# Patient Record
Sex: Male | Born: 1960 | Race: Black or African American | Hispanic: No | Smoking: Former smoker
Health system: Southern US, Community
[De-identification: ages and names within clinical notes are randomized; demographics above are authoritative.]

## PROBLEM LIST (undated history)

## (undated) DIAGNOSIS — E669 Obesity, unspecified: Secondary | ICD-10-CM

## (undated) DIAGNOSIS — I639 Cerebral infarction, unspecified: Secondary | ICD-10-CM

## (undated) DIAGNOSIS — K219 Gastro-esophageal reflux disease without esophagitis: Secondary | ICD-10-CM

## (undated) DIAGNOSIS — I209 Angina pectoris, unspecified: Secondary | ICD-10-CM

## (undated) DIAGNOSIS — I10 Essential (primary) hypertension: Secondary | ICD-10-CM

## (undated) DIAGNOSIS — R0600 Dyspnea, unspecified: Secondary | ICD-10-CM

## (undated) DIAGNOSIS — I2510 Atherosclerotic heart disease of native coronary artery without angina pectoris: Secondary | ICD-10-CM

## (undated) DIAGNOSIS — Z992 Dependence on renal dialysis: Secondary | ICD-10-CM

## (undated) DIAGNOSIS — N186 End stage renal disease: Secondary | ICD-10-CM

## (undated) DIAGNOSIS — N179 Acute kidney failure, unspecified: Secondary | ICD-10-CM

## (undated) DIAGNOSIS — Z8489 Family history of other specified conditions: Secondary | ICD-10-CM

## (undated) DIAGNOSIS — Z720 Tobacco use: Secondary | ICD-10-CM

## (undated) DIAGNOSIS — E7800 Pure hypercholesterolemia, unspecified: Secondary | ICD-10-CM

## (undated) DIAGNOSIS — E1169 Type 2 diabetes mellitus with other specified complication: Secondary | ICD-10-CM

## (undated) DIAGNOSIS — K5790 Diverticulosis of intestine, part unspecified, without perforation or abscess without bleeding: Secondary | ICD-10-CM

## (undated) HISTORY — PX: TONSILLECTOMY: SUR1361

---
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
## 2012-01-22 ENCOUNTER — Encounter (HOSPITAL_COMMUNITY): Payer: Self-pay | Admitting: Emergency Medicine

## 2012-01-22 ENCOUNTER — Emergency Department (HOSPITAL_COMMUNITY)
Admission: EM | Admit: 2012-01-22 | Discharge: 2012-01-22 | Disposition: A | Discharge status: Home / Self Care | Payer: Self-pay | Attending: Emergency Medicine | Admitting: Emergency Medicine

## 2012-01-22 ENCOUNTER — Emergency Department (HOSPITAL_COMMUNITY): Payer: Self-pay

## 2012-01-22 DIAGNOSIS — R1032 Left lower quadrant pain: Secondary | ICD-10-CM | POA: Insufficient documentation

## 2012-01-22 DIAGNOSIS — K5790 Diverticulosis of intestine, part unspecified, without perforation or abscess without bleeding: Secondary | ICD-10-CM | POA: Insufficient documentation

## 2012-01-22 DIAGNOSIS — Z8719 Personal history of other diseases of the digestive system: Secondary | ICD-10-CM | POA: Insufficient documentation

## 2012-01-22 DIAGNOSIS — I10 Essential (primary) hypertension: Secondary | ICD-10-CM

## 2012-01-22 DIAGNOSIS — R0602 Shortness of breath: Secondary | ICD-10-CM

## 2012-01-22 DIAGNOSIS — R079 Chest pain, unspecified: Secondary | ICD-10-CM

## 2012-01-22 DIAGNOSIS — G8929 Other chronic pain: Secondary | ICD-10-CM | POA: Insufficient documentation

## 2012-01-22 DIAGNOSIS — F17200 Nicotine dependence, unspecified, uncomplicated: Secondary | ICD-10-CM | POA: Insufficient documentation

## 2012-01-22 HISTORY — DX: Essential (primary) hypertension: I10

## 2012-01-22 HISTORY — DX: Diverticulosis of intestine, part unspecified, without perforation or abscess without bleeding: K57.90

## 2012-01-22 LAB — COMPREHENSIVE METABOLIC PANEL
ALT: 14 U/L (ref 0–53)
AST: 16 U/L (ref 0–37)
Albumin: 4.2 g/dL (ref 3.5–5.2)
Alkaline Phosphatase: 132 U/L — ABNORMAL HIGH (ref 39–117)
BUN: 15 mg/dL (ref 6–23)
CO2: 32 mEq/L (ref 19–32)
Calcium: 10.1 mg/dL (ref 8.4–10.5)
Chloride: 100 mEq/L (ref 96–112)
Creatinine, Ser: 1.3 mg/dL (ref 0.50–1.35)
GFR calc Af Amer: 72 mL/min — ABNORMAL LOW (ref >90)
GFR calc non Af Amer: 62 mL/min — ABNORMAL LOW (ref >90)
Glucose, Bld: 134 mg/dL — ABNORMAL HIGH (ref 70–99)
Potassium: 4.4 mEq/L (ref 3.5–5.1)
Sodium: 144 mEq/L (ref 135–145)
Total Bilirubin: 0.3 mg/dL (ref 0.3–1.2)
Total Protein: 8.3 g/dL (ref 6.0–8.3)

## 2012-01-22 LAB — CBC WITH DIFFERENTIAL/PLATELET
Basophils Absolute: 0 10*3/uL (ref 0.0–0.1)
Basophils Relative: 0 % (ref 0–1)
Eosinophils Absolute: 0.2 10*3/uL (ref 0.0–0.7)
Eosinophils Relative: 2 % (ref 0–5)
HCT: 45.2 % (ref 39.0–52.0)
Hemoglobin: 15.1 g/dL (ref 13.0–17.0)
Lymphocytes Relative: 34 % (ref 12–46)
Lymphs Abs: 4.5 10*3/uL — ABNORMAL HIGH (ref 0.7–4.0)
MCH: 28.5 pg (ref 26.0–34.0)
MCHC: 33.4 g/dL (ref 30.0–36.0)
MCV: 85.3 fL (ref 78.0–100.0)
Monocytes Absolute: 1 10*3/uL (ref 0.1–1.0)
Monocytes Relative: 8 % (ref 3–12)
Neutro Abs: 7.5 10*3/uL (ref 1.7–7.7)
Neutrophils Relative %: 57 % (ref 43–77)
Platelets: 259 10*3/uL (ref 150–400)
RBC: 5.3 MIL/uL (ref 4.22–5.81)
RDW: 13.7 % (ref 11.5–15.5)
WBC: 13.3 10*3/uL — ABNORMAL HIGH (ref 4.0–10.5)

## 2012-01-22 LAB — POCT I-STAT TROPONIN I
Troponin i, poc: 0 ng/mL (ref 0.00–0.08)
Troponin i, poc: 0 ng/mL (ref 0.00–0.08)

## 2012-01-22 IMAGING — CR DG CHEST 2V
2 series · 2 of 2 positions shown · non-contrast
Comparison: None.

CLINICAL DATA: Chest pain.  Short of breath.

CHEST - 2 VIEW

[w chest pa]
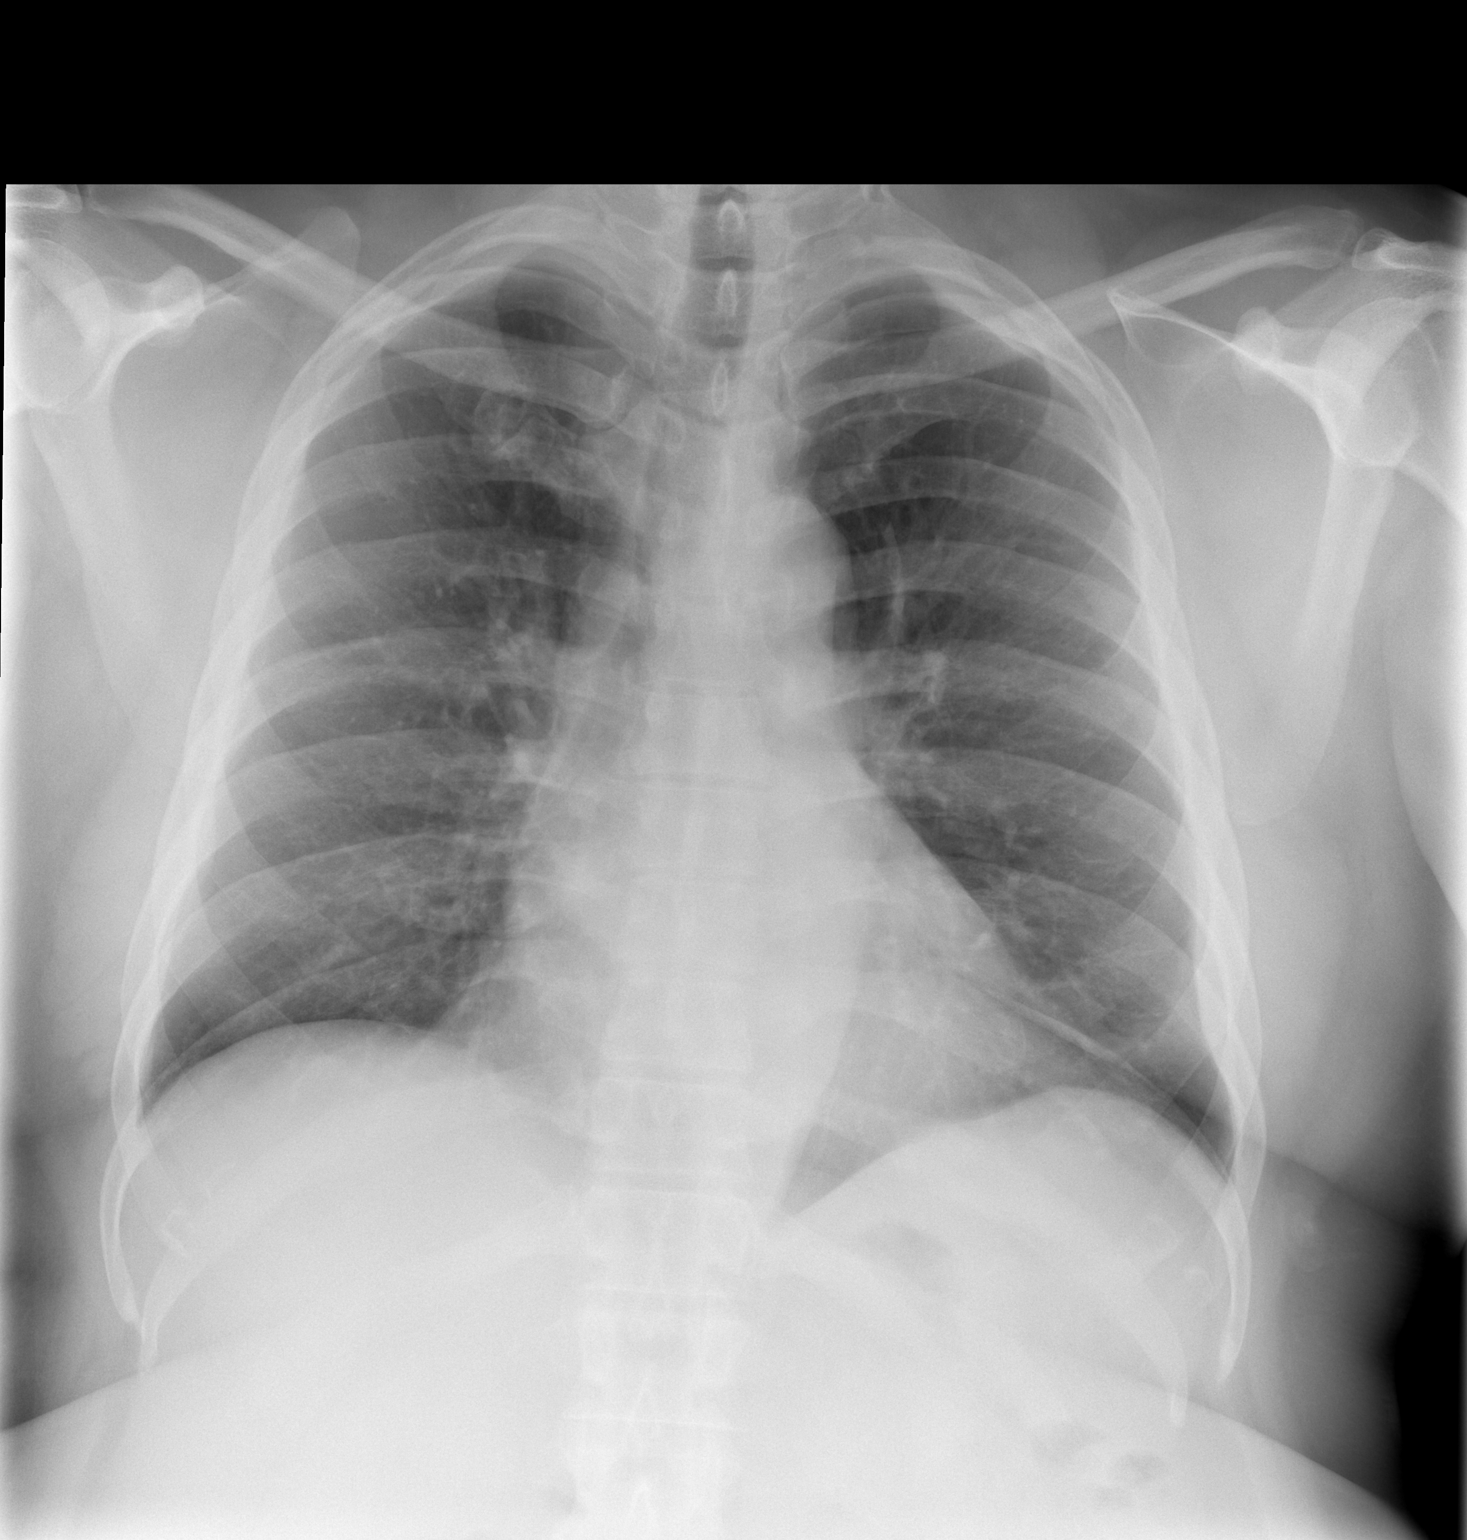

[w chest lat]
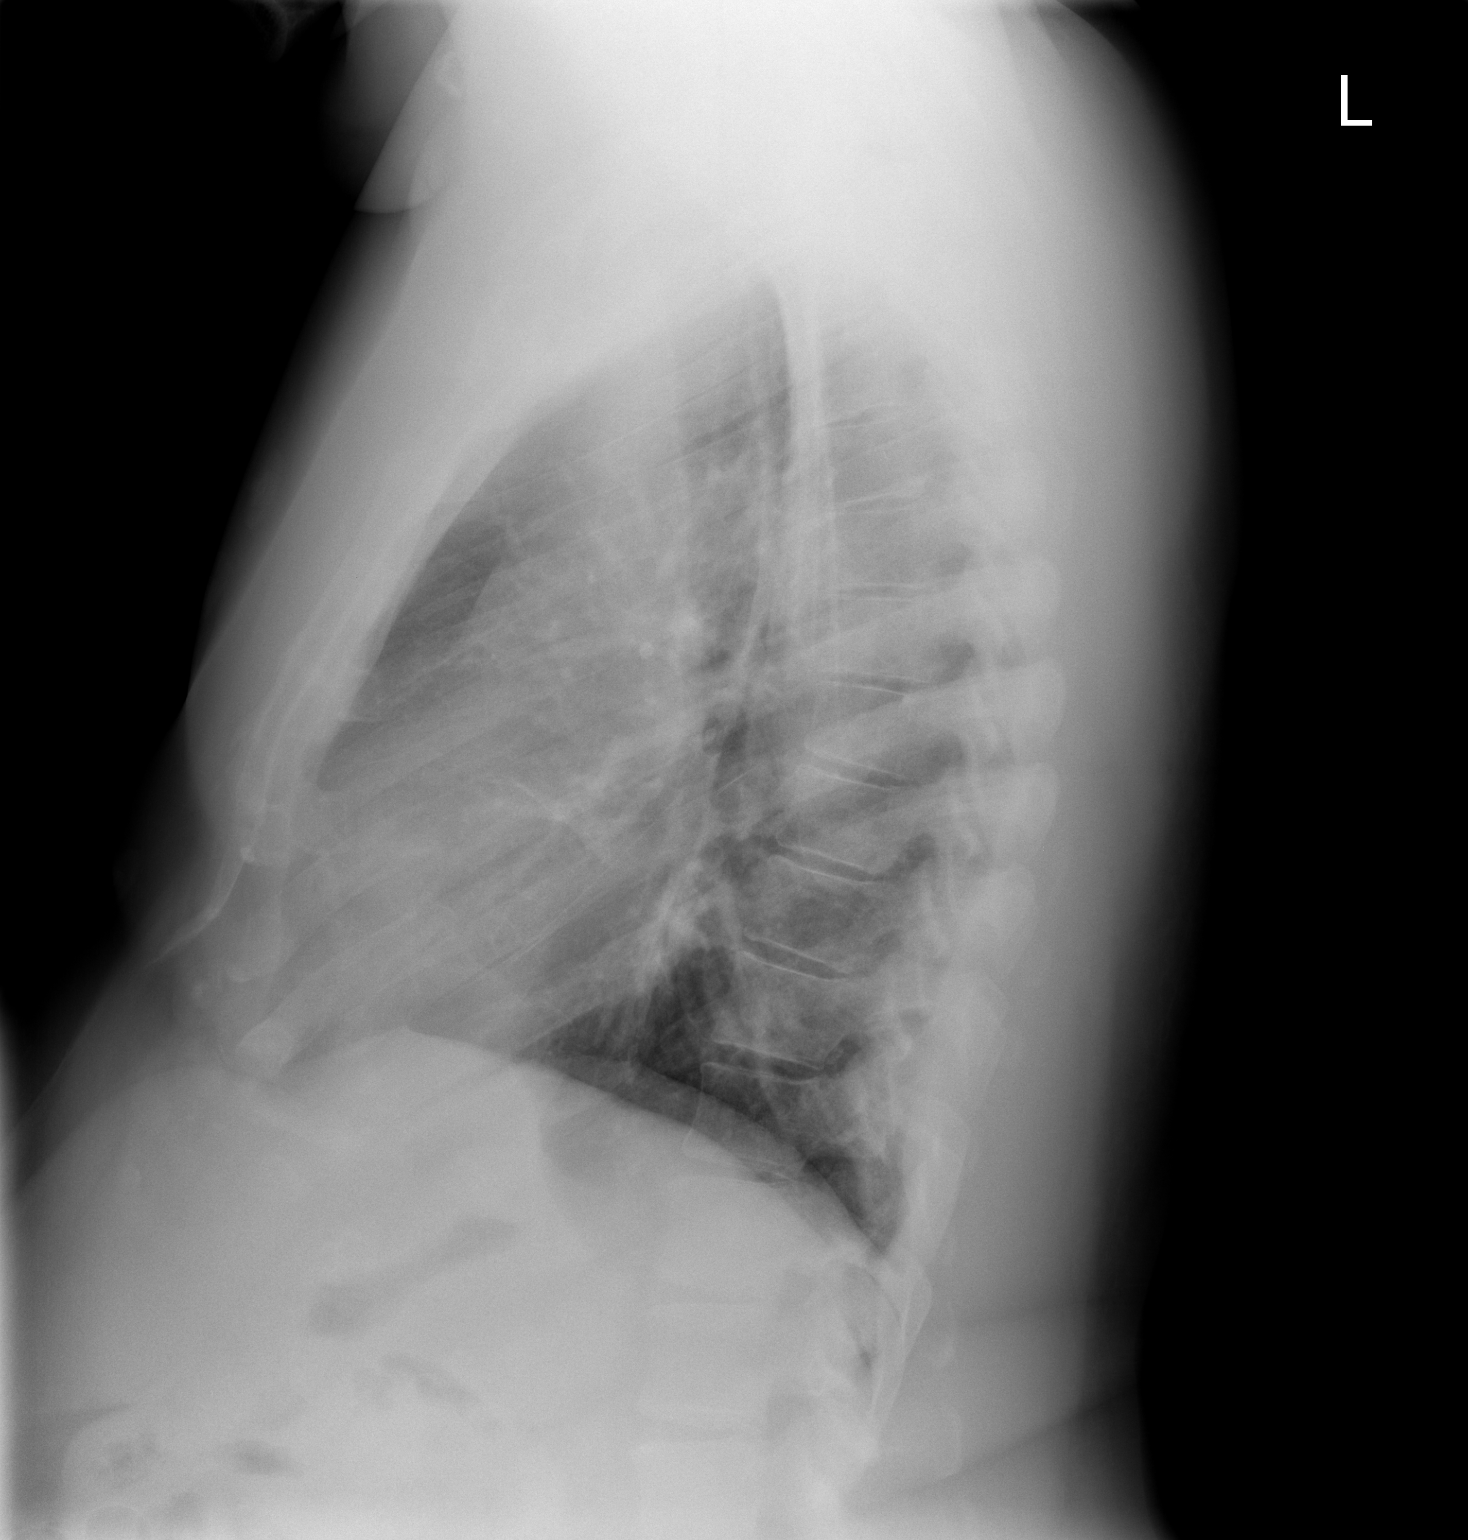

[2 of 2 positions shown; findings below may reference images not displayed]

FINDINGS: Subsegmental atelectasis is present in the lingula.  This
is seen on both frontal and lateral views. No pneumothorax.  No
airspace disease.  No effusion.  Cardiopericardial silhouette and
mediastinal contours are within normal limits.  Trachea midline.
IMPRESSION: No acute cardiopulmonary disease.  Subsegmental atelectasis in the
lingula.

## 2012-01-22 MED ORDER — IRBESARTAN 75 MG PO TABS: 75.0000 mg | ORAL_TABLET | Freq: Every day | ORAL | Status: DC

## 2012-01-22 MED ORDER — AMLODIPINE BESYLATE 5 MG PO TABS
5.0000 mg | ORAL_TABLET | Freq: Once | ORAL | Status: AC
  Administered 2012-01-22: 5 mg via ORAL
  Filled 2012-01-22: qty 1

## 2012-01-22 MED ORDER — AMLODIPINE BESYLATE 5 MG PO TABS: 5.0000 mg | ORAL_TABLET | Freq: Once | ORAL | Status: DC

## 2012-01-22 MED ORDER — IRBESARTAN 75 MG PO TABS
75.0000 mg | ORAL_TABLET | Freq: Every day | ORAL | Status: DC
  Administered 2012-01-22 (×2): 75 mg via ORAL
  Filled 2012-01-22: qty 1

## 2012-01-22 NOTE — ED Notes (Addendum)
Patient complaining of intermittent chest pain and shortness of breath for the past three weeks; was seen by primary care physician today and referred here.  Patient denies chest pain at this time.  Patient reports that pain sometimes begins upon exertion.  Only cardiac history reported is hypertension; blood pressure elevated in triage (99991111 systolic); patient reports that he has not been taking his blood pressure medications for the past three months due to his insurance.

## 2012-01-22 NOTE — ED Provider Notes (Signed)
History     CSN: [id number]  Arrival date & time 01/22/12  1909   First MD Initiated Contact with Patient 01/22/12 1954      Chief Complaint  Patient presents with  . Chest Pain  . Shortness of Breath    (Consider location/radiation/quality/duration/timing/severity/associated sxs/prior treatment) Patient is a 51 y.o. male presenting with shortness of breath. The history is provided by the patient.  Shortness of Breath  The current episode started more than 2 weeks ago. The onset was sudden. The problem occurs occasionally. The problem has been gradually worsening. The problem is mild. The symptoms are relieved by rest. Nothing aggravates the symptoms. Associated symptoms include chest pain (with the shortness of breath) and shortness of breath. Pertinent negatives include no chest pressure, no fever and no cough.    Past Medical History  Diagnosis Date  . Hypertension   . Diverticulosis     History reviewed. No pertinent past surgical history.  History reviewed. No pertinent family history.  History  Substance Use Topics  . Smoking status: Current Every Day Smoker -- 0.5 packs/day  . Smokeless tobacco: Not on file  . Alcohol Use: Yes      Review of Systems  Constitutional: Negative for fever.  Respiratory: Positive for shortness of breath. Negative for cough.   Cardiovascular: Positive for chest pain (with the shortness of breath).  All other systems reviewed and are negative.    Allergies  Aspirin  Home Medications  No current outpatient prescriptions on file.  BP 187/104  Pulse 103  Temp 97.8 F (36.6 C) (Oral)  Resp 20  SpO2 96%  Physical Exam  Nursing note and vitals reviewed. Constitutional: He is oriented to person, place, and time. He appears well-developed and well-nourished. No distress.  HENT:  Head: Normocephalic and atraumatic.  Mouth/Throat: No oropharyngeal exudate.  Eyes: EOM are normal. Pupils are equal, round, and reactive to light.   Neck: Normal range of motion. Neck supple.  Cardiovascular: Normal rate and regular rhythm.  Exam reveals no friction rub.   No murmur heard. Pulmonary/Chest: Effort normal and breath sounds normal. No respiratory distress. He has no wheezes. He has no rales.  Abdominal: He exhibits no distension. There is tenderness (RLQ, chronic). There is no rebound.  Musculoskeletal: Normal range of motion. He exhibits no edema.  Neurological: He is alert and oriented to person, place, and time.  Skin: He is not diaphoretic.    ED Course  Procedures (including critical care time)  Labs Reviewed  CBC WITH DIFFERENTIAL - Abnormal; Notable for the following:    WBC 13.3 (*)     Lymphs Abs 4.5 (*)     All other components within normal limits  COMPREHENSIVE METABOLIC PANEL - Abnormal; Notable for the following:    Glucose, Bld 134 (*)     Alkaline Phosphatase 132 (*)     GFR calc non Af Amer 62 (*)     GFR calc Af Amer 72 (*)     All other components within normal limits  POCT I-STAT TROPONIN I   Dg Chest 2 View  01/22/2012  *RADIOLOGY REPORT*  Clinical Data: Chest pain.  Short of breath.  CHEST - 2 VIEW  Comparison: None.  Findings: Subsegmental atelectasis is present in the lingula.  This is seen on both frontal and lateral views. No pneumothorax.  No airspace disease.  No effusion.  Cardiopericardial silhouette and mediastinal contours are within normal limits.  Trachea midline.  IMPRESSION: No acute cardiopulmonary disease.  Subsegmental atelectasis in the lingula.   Original Report Authenticated By: Dereck Ligas, M.D.      No diagnosis found.   Date: 01/22/2012  Rate: 93  Rhythm: normal sinus rhythm  QRS Axis: normal  Intervals: normal  ST/T Wave abnormalities: Flipped T waves in inferior and lateral leads  Conduction Disutrbances:none  Narrative Interpretation:   Old EKG Reviewed: none available    MDM   The patient is a 51 year old male who presents with episodes of  shortness of breath that has increased in frequency over the past month. He describes these episodes of this brief lasting anywhere from several minutes to an hour at a time. They happen spontaneously also occasionally happen with exertion. He occasionally has chest pain with these episodes. They have become more frequent in the last week. They are not always associated with exertion. Patient has no primary heart history. He has hypertension, and he has not been taking his medications for the past several months. He also smokes half a pack per day and is morbidly obese. Patient reports he went to the doctor today because he wanted to get control with self, not due to any chest pain today, and he was sent here for further evaluation after EKG showed some flipped T waves in inferior and lateral leads. We do not have any prior EKGs. Patient is asymptomatic. He is resting well. He is hypertensive. He used to take a combo of amlodipine and valsartan. I will give him that now and write him a prescription for the same. Patient's exam is benign. Patient's EKG here similar to prior needle with flipped T waves in the inferior and lateral leads, c/w anda strain pattern. I feel pa plastic and tient's chest pain and shortness of breath or due to several factors. I feel like it is due to his deconditioning and obesity plus a smoking history. With being completely asymptomatic here, I felt like patient could pursue an outpatient versus CDU stress. I explained these options to the patient he would prefer outpatient stress I will perform a delta troponin as the first was normal. I will start him on his valsartan and amlodipine to go home with. Patient also states some left lower quadrant pain which is chronic. He states he has been worked up for diverticulitis but was found to not have diverticulitis. He is mildly tender there without guarding or rebound. Do not feel he warrants imaging at this time. I will give him PCP followup  for chest pain and will instructed to follow up with PCP for this abdominal pain as well. Delta troponin negative - stable for discharge. Patient comfortable with this plan.         Evelina Bucy, MD 01/22/12 [phone]

## 2012-01-28 NOTE — ED Provider Notes (Signed)
I saw and evaluated the patient, reviewed the resident's note and I agree with the findings and plan.  51 year old male with exertional dyspnea. Exam patient is in no acute distress. He is obese. Lungs are clear bilaterally with no increased work of breathing. Heart is regular without murmur. Abdomen is benign. Lower extremities symmetric as compared to each other. No calf tenderness. Negative Homan's. No palpable cords. I suspect patient's symptoms are multifactorial. He is obese and has a fairly sedentary lifestyle. Denies suspect that he is deconditioned. Patient also is a smoker and continues to smoke. Consider anginal 1, but doubt. Patient does have some flipped T waves on his EKG, but no prior for comparison. Suspect LVH with strain. Patient is mildly hypertensive. Patient started back on his home medications. Troponin x2 is normal. Offered patient observation to CDU with the plan for stress testing in the morning. Patient would rather followup as an outpatient. I feel is safe for discharge. Return precautions were discussed  Virgel Manifold, MD 01/28/12 [phone]

## 2015-03-15 ENCOUNTER — Emergency Department (HOSPITAL_COMMUNITY): Payer: BLUE CROSS/BLUE SHIELD

## 2015-03-15 ENCOUNTER — Encounter (HOSPITAL_COMMUNITY): Payer: Self-pay | Admitting: *Deleted

## 2015-03-15 ENCOUNTER — Emergency Department (HOSPITAL_COMMUNITY)
Admission: EM | Admit: 2015-03-15 | Discharge: 2015-03-16 | Disposition: A | Discharge status: Home / Self Care | Payer: BLUE CROSS/BLUE SHIELD | Source: Home / Self Care | Attending: Emergency Medicine | Admitting: Emergency Medicine

## 2015-03-15 DIAGNOSIS — F17210 Nicotine dependence, cigarettes, uncomplicated: Secondary | ICD-10-CM

## 2015-03-15 DIAGNOSIS — Z7984 Long term (current) use of oral hypoglycemic drugs: Secondary | ICD-10-CM | POA: Insufficient documentation

## 2015-03-15 DIAGNOSIS — I10 Essential (primary) hypertension: Secondary | ICD-10-CM | POA: Insufficient documentation

## 2015-03-15 DIAGNOSIS — E7800 Pure hypercholesterolemia, unspecified: Secondary | ICD-10-CM

## 2015-03-15 DIAGNOSIS — R079 Chest pain, unspecified: Secondary | ICD-10-CM | POA: Insufficient documentation

## 2015-03-15 DIAGNOSIS — F419 Anxiety disorder, unspecified: Secondary | ICD-10-CM | POA: Insufficient documentation

## 2015-03-15 DIAGNOSIS — I214 Non-ST elevation (NSTEMI) myocardial infarction: Secondary | ICD-10-CM | POA: Diagnosis not present

## 2015-03-15 DIAGNOSIS — Z8719 Personal history of other diseases of the digestive system: Secondary | ICD-10-CM

## 2015-03-15 DIAGNOSIS — E119 Type 2 diabetes mellitus without complications: Secondary | ICD-10-CM

## 2015-03-15 DIAGNOSIS — R0602 Shortness of breath: Secondary | ICD-10-CM | POA: Insufficient documentation

## 2015-03-15 DIAGNOSIS — Z79899 Other long term (current) drug therapy: Secondary | ICD-10-CM | POA: Insufficient documentation

## 2015-03-15 HISTORY — DX: Pure hypercholesterolemia, unspecified: E78.00

## 2015-03-15 LAB — CBC
HEMATOCRIT: 43.7 % (ref 39.0–52.0)
Hemoglobin: 14.4 g/dL (ref 13.0–17.0)
MCH: 28.2 pg (ref 26.0–34.0)
MCHC: 33 g/dL (ref 30.0–36.0)
MCV: 85.5 fL (ref 78.0–100.0)
PLATELETS: 287 10*3/uL (ref 150–400)
RBC: 5.11 MIL/uL (ref 4.22–5.81)
RDW: 13.6 % (ref 11.5–15.5)
WBC: 9.9 10*3/uL (ref 4.0–10.5)

## 2015-03-15 LAB — BASIC METABOLIC PANEL
ANION GAP: 15 (ref 5–15)
BUN: 21 mg/dL — ABNORMAL HIGH (ref 6–20)
CO2: 28 mmol/L (ref 22–32)
Calcium: 10.6 mg/dL — ABNORMAL HIGH (ref 8.9–10.3)
Chloride: 97 mmol/L — ABNORMAL LOW (ref 101–111)
Creatinine, Ser: 1.57 mg/dL — ABNORMAL HIGH (ref 0.61–1.24)
GFR calc Af Amer: 56 mL/min — ABNORMAL LOW (ref >60)
GFR, EST NON AFRICAN AMERICAN: 48 mL/min — AB (ref >60)
GLUCOSE: 358 mg/dL — AB (ref 65–99)
POTASSIUM: 4.8 mmol/L (ref 3.5–5.1)
Sodium: 140 mmol/L (ref 135–145)

## 2015-03-15 LAB — I-STAT TROPONIN, ED
TROPONIN I, POC: 0.04 ng/mL (ref 0.00–0.08)
Troponin i, poc: 0.04 ng/mL (ref 0.00–0.08)

## 2015-03-15 LAB — D-DIMER, QUANTITATIVE (NOT AT ARMC): D DIMER QUANT: 0.58 ug{FEU}/mL — AB (ref 0.00–0.50)

## 2015-03-15 IMAGING — DX DG CHEST 2V
2 series · 2 of 2 positions shown · non-contrast
Comparison: PA and lateral chest [DATE].

CLINICAL DATA: Chest pain and shortness of breath today. Initial
encounter.

EXAM:
CHEST  2 VIEW

[chest pa]
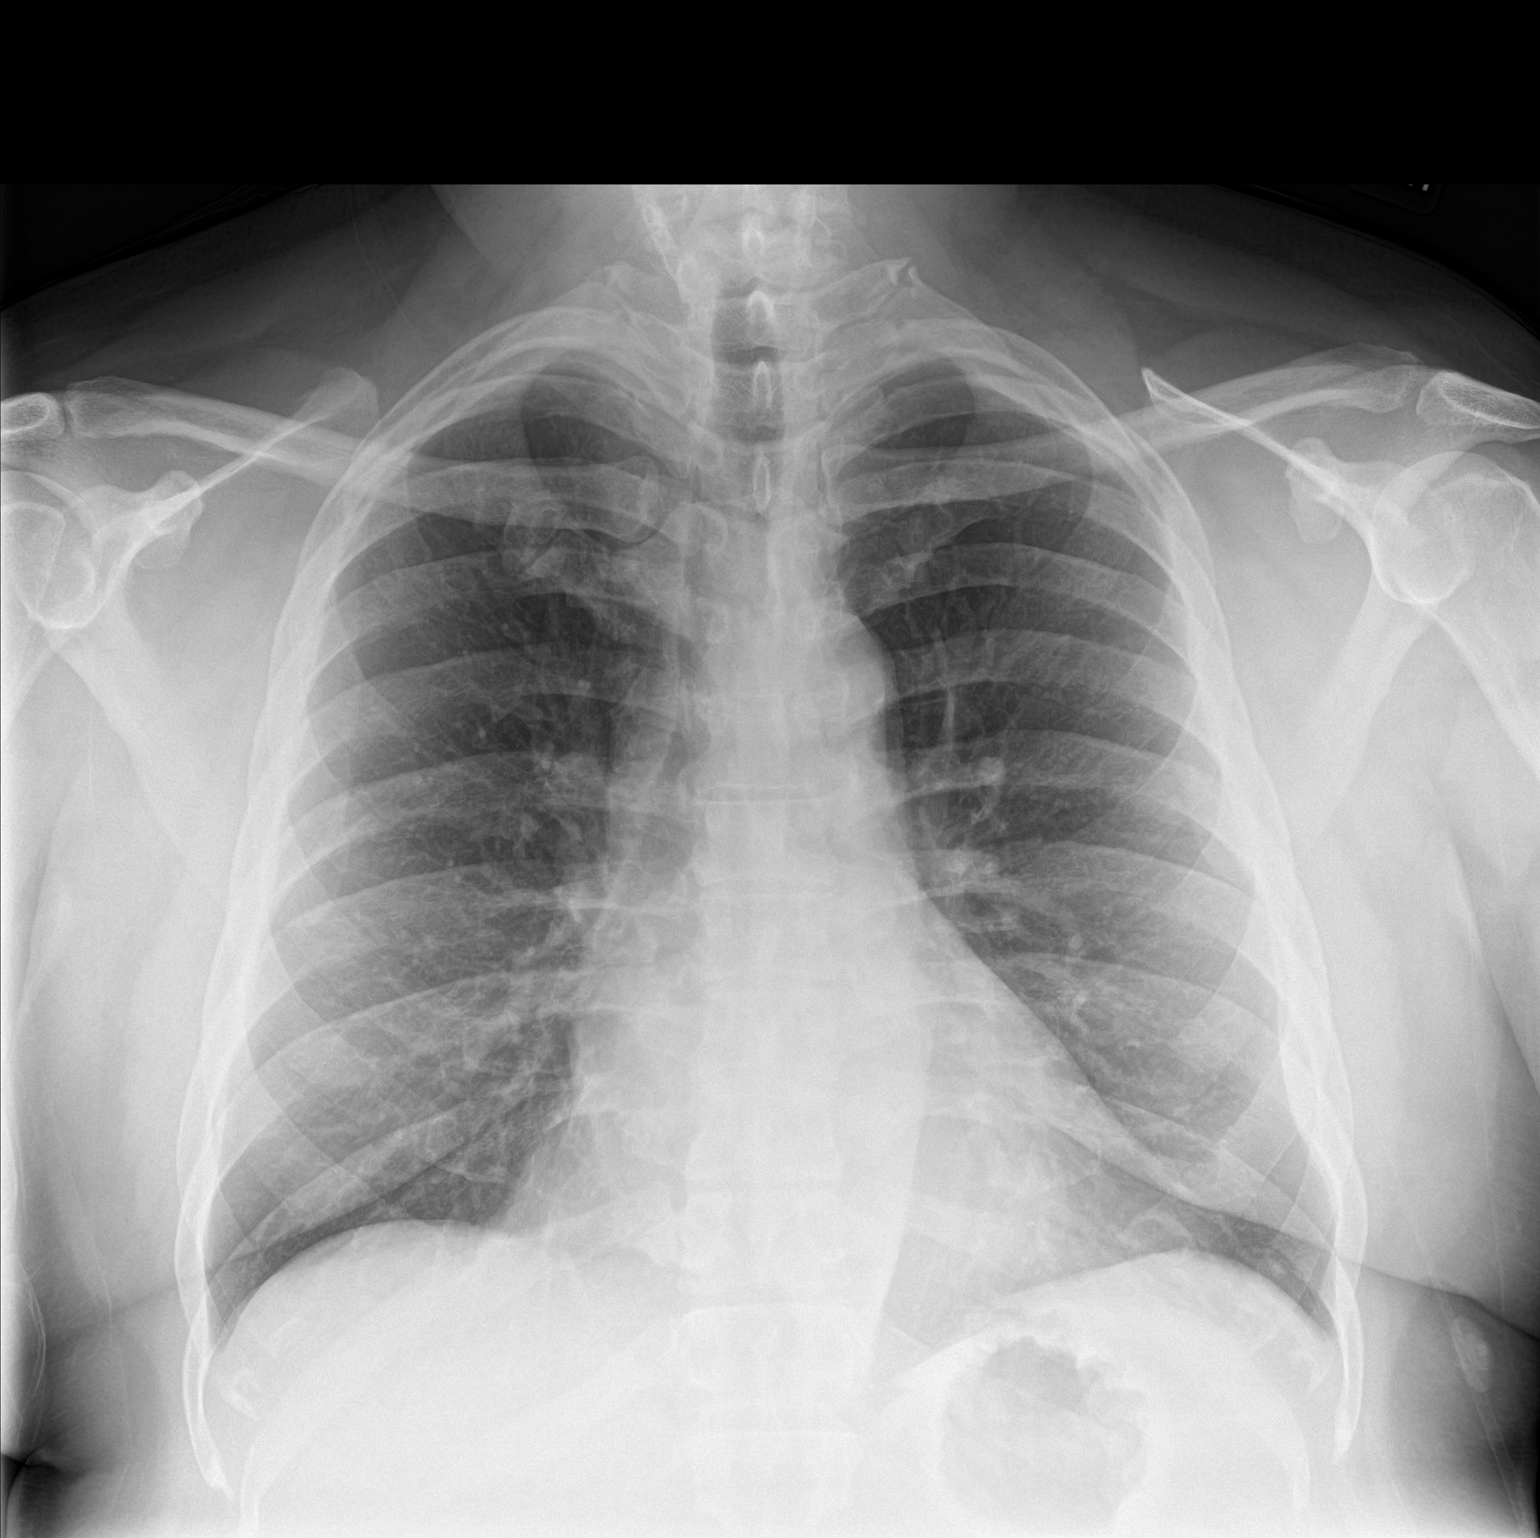

[chest lat]
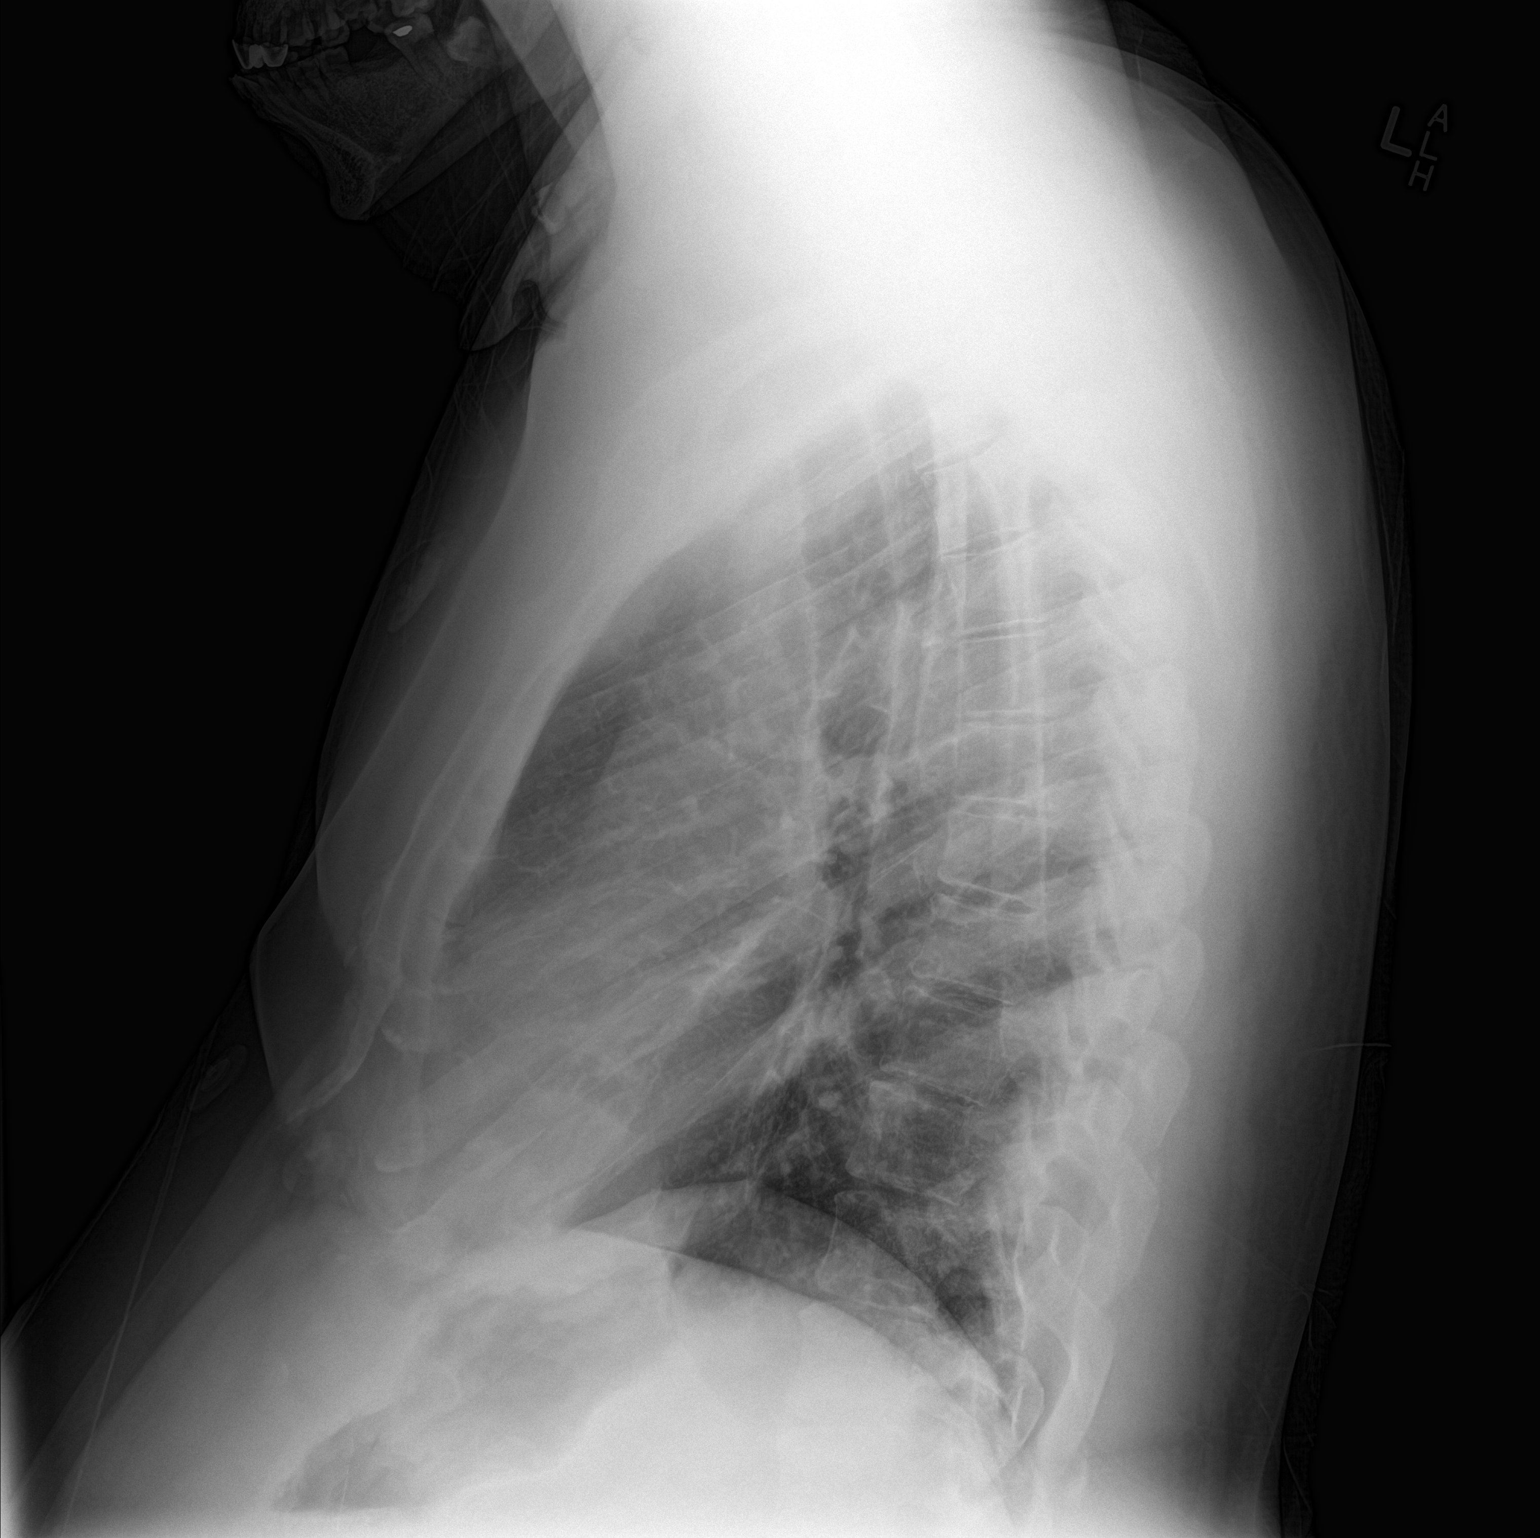

[2 of 2 positions shown; findings below may reference images not displayed]

FINDINGS: The lungs are clear. Heart size is normal. No pneumothorax or
pleural effusion. No focal bony abnormality.
IMPRESSION: Negative chest.

## 2015-03-15 MED ORDER — ASPIRIN 81 MG PO CHEW: 324.0000 mg | CHEWABLE_TABLET | Freq: Once | ORAL | Status: DC

## 2015-03-15 MED ORDER — SODIUM CHLORIDE 0.9 % IV BOLUS (SEPSIS)
1000.0000 mL | Freq: Once | INTRAVENOUS | Status: AC
  Administered 2015-03-15: 1000 mL via INTRAVENOUS

## 2015-03-15 MED ORDER — IOHEXOL 350 MG/ML SOLN
80.0000 mL | Freq: Once | INTRAVENOUS | Status: AC | PRN
Start: 2015-03-15 — End: 2015-03-15
  Administered 2015-03-15: 100 mL via INTRAVENOUS

## 2015-03-15 NOTE — ED Notes (Signed)
Attempted to call CT, line busy, in regards to ETA.

## 2015-03-15 NOTE — ED Notes (Signed)
Called CT for ETA, 2 people currently in front of patient.

## 2015-03-15 NOTE — ED Notes (Signed)
Explained plan of care to patient, preparing for ct.

## 2015-03-15 NOTE — ED Notes (Signed)
Patient is currently watching TV comfortably while waiting for CT.

## 2015-03-15 NOTE — ED Notes (Signed)
Pt c/o CP and shortness of breath x 10 days. States it may be related to his medicine.

## 2015-03-15 NOTE — ED Provider Notes (Signed)
CSN: [id number]     Arrival date & time 03/15/15  1825 History   First MD Initiated Contact with Patient 03/15/15 2022     Chief Complaint  Patient presents with  . Chest Pain     (Consider location/radiation/quality/duration/timing/severity/associated sxs/prior Treatment) Patient is a 55 y.o. male presenting with chest pain. The history is provided by the patient.  Chest Pain Pain location:  Substernal area Pain quality: pressure   Pain radiates to:  Does not radiate Pain radiates to the back: no   Pain severity:  Mild Onset quality:  Gradual Duration: going on 2 years but worse over past 10 days. Timing:  Intermittent Progression:  Waxing and waning Chronicity:  Chronic Context: not at rest   Relieved by:  Nothing Worsened by:  Nothing tried Ineffective treatments:  None tried Associated symptoms: anxiety and shortness of breath   Associated symptoms: no abdominal pain, no altered mental status, no cough, no fever, no nausea and not vomiting     Past Medical History  Diagnosis Date  . Hypertension   . Diverticulosis   . Hypercholesteremia   . Diabetes mellitus without complication ([place])    History reviewed. No pertinent past surgical history. No family history on file. Social History  Substance Use Topics  . Smoking status: Current Every Day Smoker -- 0.50 packs/day    Types: Cigarettes  . Smokeless tobacco: None  . Alcohol Use: Yes     Comment: weekend    Review of Systems  Constitutional: Negative for fever.  HENT: Negative.   Eyes: Negative for visual disturbance.  Respiratory: Positive for shortness of breath. Negative for cough.   Cardiovascular: Positive for chest pain.  Gastrointestinal: Negative for nausea, vomiting, abdominal pain and diarrhea.  Genitourinary: Negative.   Skin: Negative.   Neurological: Negative.       Allergies  Aspirin  Home Medications   Prior to Admission medications   Medication Sig Start Date End Date Taking?  Authorizing Provider  Alogliptin-Pioglitazone (OSENI) 25-30 MG TABS Take 1 tablet by mouth daily.   Yes Historical Provider, MD  amLODipine (NORVASC) 10 MG tablet Take 10 mg by mouth daily. 12/20/14  Yes Historical Provider, MD  lisinopril (PRINIVIL,ZESTRIL) 10 MG tablet Take 10 mg by mouth daily.   Yes Historical Provider, MD  pravastatin (PRAVACHOL) 10 MG tablet Take 10 mg by mouth daily.   Yes Historical Provider, MD   BP 169/95 mmHg  Pulse 74  Temp(Src) 98.1 F (36.7 C)  Resp 26  Ht 5\' 7"  (1.702 m)  Wt 101.152 kg  BMI 34.92 kg/m2  SpO2 97% Physical Exam  Constitutional: He is oriented to person, place, and time. He appears well-developed and well-nourished. No distress.  HENT:  Head: Normocephalic and atraumatic.  Mouth/Throat: No oropharyngeal exudate.  Eyes: Pupils are equal, round, and reactive to light. No scleral icterus.  Neck: Normal range of motion. Neck supple.  Cardiovascular: Normal rate, regular rhythm, normal heart sounds and intact distal pulses.   Pulmonary/Chest: Effort normal and breath sounds normal. No respiratory distress. He has no wheezes. He has no rales. He exhibits no tenderness.  Abdominal: Soft. Bowel sounds are normal. He exhibits no distension. There is no tenderness. There is no rebound and no guarding.  Musculoskeletal: Normal range of motion. He exhibits no edema or tenderness.  Neurological: He is alert and oriented to person, place, and time. No cranial nerve deficit. He exhibits normal muscle tone. Coordination normal.  Skin: Skin is warm and dry. No rash  noted. He is not diaphoretic. No erythema. No pallor.  Psychiatric: He has a normal mood and affect.  Nursing note and vitals reviewed.   ED Course  Procedures (including critical care time) Labs Review Labs Reviewed  BASIC METABOLIC PANEL - Abnormal; Notable for the following:    Chloride 97 (*)    Glucose, Bld 358 (*)    BUN 21 (*)    Creatinine, Ser 1.57 (*)    Calcium 10.6 (*)     GFR calc non Af Amer 48 (*)    GFR calc Af Amer 56 (*)    All other components within normal limits  D-DIMER, QUANTITATIVE (NOT AT [facility]) - Abnormal; Notable for the following:    D-Dimer, Quant 0.58 (*)    All other components within normal limits  CBC  I-STAT TROPOININ, ED  Randolm Idol, ED    Imaging Review Dg Chest 2 View  03/15/2015  CLINICAL DATA:  Chest pain and shortness of breath today. Initial encounter. EXAM: CHEST  2 VIEW COMPARISON:  PA and lateral chest 01/22/2012. FINDINGS: The lungs are clear. Heart size is normal. No pneumothorax or pleural effusion. No focal bony abnormality. IMPRESSION: Negative chest. Electronically Signed   By: Inge Rise M.D.   On: 03/15/2015 19:19   Ct Angio Chest Pe W/cm &/or Wo Cm  03/16/2015  CLINICAL DATA:  Chest pain and shortness of breath for 10 days. EXAM: CT ANGIOGRAPHY CHEST WITH CONTRAST TECHNIQUE: Multidetector CT imaging of the chest was performed using the standard protocol during bolus administration of intravenous contrast. Multiplanar CT image reconstructions and MIPs were obtained to evaluate the vascular anatomy. CONTRAST:  146mL OMNIPAQUE IOHEXOL 350 MG/ML SOLN COMPARISON:  Radiographs 5 hours prior. FINDINGS: There are no filling defects within the pulmonary arteries to suggest pulmonary embolus. Thoracic aorta normal in caliber with mild atherosclerosis. Scattered coronary artery calcifications. Heart upper limits of normal in size. No mediastinal or hilar adenopathy. No pleural or pericardial effusion. Heterogeneous diffuse enlargement of the right lobe of the thyroid gland without CT defined nodule. Scattered atelectasis and hypoventilatory change. There is subpleural fat in the left lung base. Evaluation of the upper abdomen demonstrates no acute abnormality. Liver appears enlarged with steatosis. There are no acute or suspicious osseous abnormalities. Review of the MIP images confirms the above findings. IMPRESSION: 1. No  pulmonary embolus or acute intrathoracic process. 2. Coronary artery calcifications. 3. Incidental findings of heterogeneous enlargement of the right thyroid gland, no CT defined nodule, suspect goiter. 4. Hepatic steatosis. Electronically Signed   By: Jeb Levering M.D.   On: 03/16/2015 00:35   I have personally reviewed and evaluated these images and lab results as part of my medical decision-making.   EKG Interpretation   Date/Time:  Thursday March 15 2015 18:35:34 EST Ventricular Rate:  91 PR Interval:  142 QRS Duration: 88 QT Interval:  358 QTC Calculation: 440 R Axis:   61 Text Interpretation:  Normal sinus rhythm Biatrial enlargement T wave  abnormality, consider inferior ischemia Abnormal ECG No significant change  since last tracing Confirmed by BEATON  MD, ROBERT ([id number]) on 03/15/2015  6:35:26 PM      MDM   Final diagnoses:  SOB (shortness of breath)  Chest pain, unspecified chest pain type    Patient is a 55 year old male with a history of diabetes and hypertension who presents with shortness of breath and chest pain that has been intermittent in nature for about 2 years but over the past 10 days  has been worse and persistent. He describes it as a sensation of not being able to catch his breath as well as a midline dull pressure that does not radiate. Further history and exam as above notable for reassuring vital signs and physical exam. Patient has a negative delta troponin. BMP and CBC unremarkable. EKG without acute ischemia or arrhythmia. D-dimer obtained which was positive. CT PE study without PE. Patient is a low heart score.  I have reviewed all imaging and labs. Patient stable for discharge home.  I have reviewed all results with the patient. Advised to f/u with cardiology within 7 days for further evaluation. Patient agrees to stated plan. All questions answered. Advised to call or return to have any questions, new symptoms, change in symptoms, or symptoms that  they do not understand.    Heriberto Antigua, MD [id number]  Delora Fuel, MD [id number]

## 2015-03-15 NOTE — ED Notes (Signed)
Verified that patient already took aspirin today at doctor's office prior to arrival. Explained plan of care, food provided, next blood draw scheduled.

## 2015-03-15 NOTE — ED Notes (Signed)
Dr. Lanetta Inch at the bedside.

## 2015-03-15 NOTE — ED Notes (Signed)
Meal provided 

## 2015-03-15 NOTE — ED Notes (Signed)
Called CT, spoke to [facility], patient to be picked up by transport next for CTA.

## 2015-03-15 NOTE — ED Notes (Signed)
Called main lab, spoke to Keyser.  D-dimer added on.

## 2015-03-16 ENCOUNTER — Encounter: Payer: Self-pay | Admitting: Cardiovascular Disease

## 2015-03-16 ENCOUNTER — Ambulatory Visit (INDEPENDENT_AMBULATORY_CARE_PROVIDER_SITE_OTHER): Payer: BLUE CROSS/BLUE SHIELD | Admitting: Cardiovascular Disease

## 2015-03-16 ENCOUNTER — Telehealth: Payer: Self-pay

## 2015-03-16 VITALS — BP 122/80 | HR 100 | Ht 67.0 in | Wt 230.0 lb

## 2015-03-16 DIAGNOSIS — I152 Hypertension secondary to endocrine disorders: Secondary | ICD-10-CM | POA: Insufficient documentation

## 2015-03-16 DIAGNOSIS — R0602 Shortness of breath: Secondary | ICD-10-CM | POA: Diagnosis not present

## 2015-03-16 DIAGNOSIS — E1169 Type 2 diabetes mellitus with other specified complication: Secondary | ICD-10-CM | POA: Insufficient documentation

## 2015-03-16 DIAGNOSIS — E785 Hyperlipidemia, unspecified: Secondary | ICD-10-CM | POA: Diagnosis not present

## 2015-03-16 DIAGNOSIS — I10 Essential (primary) hypertension: Secondary | ICD-10-CM | POA: Diagnosis not present

## 2015-03-16 DIAGNOSIS — E1159 Type 2 diabetes mellitus with other circulatory complications: Secondary | ICD-10-CM | POA: Insufficient documentation

## 2015-03-16 DIAGNOSIS — R079 Chest pain, unspecified: Secondary | ICD-10-CM

## 2015-03-16 DIAGNOSIS — R0600 Dyspnea, unspecified: Secondary | ICD-10-CM | POA: Insufficient documentation

## 2015-03-16 DIAGNOSIS — R0609 Other forms of dyspnea: Secondary | ICD-10-CM

## 2015-03-16 HISTORY — DX: Hypertension secondary to endocrine disorders: I15.2

## 2015-03-16 HISTORY — DX: Type 2 diabetes mellitus with other circulatory complications: E11.59

## 2015-03-16 HISTORY — DX: Other forms of dyspnea: R06.09

## 2015-03-16 IMAGING — CT CT ANGIO CHEST
1 of 9 series · 14 of 36 positions shown · IV contrast (Iohexol (Omnipaque 350))
Comparison: Radiographs 5 hours prior.

CLINICAL DATA: Chest pain and shortness of breath for 10 days.

EXAM:
CT ANGIOGRAPHY CHEST WITH CONTRAST
TECHNIQUE: Multidetector CT imaging of the chest was performed using the
standard protocol during bolus administration of intravenous
contrast. Multiplanar CT image reconstructions and MIPs were
obtained to evaluate the vascular anatomy.
CONTRAST:  100mL OMNIPAQUE IOHEXOL 350 MG/ML SOLN

[Series 406: thins pacs · axial · 0.66mm/px · z∈[-533,-283]mm · 14 of 290 slices shown]
[im 20/290  lung]
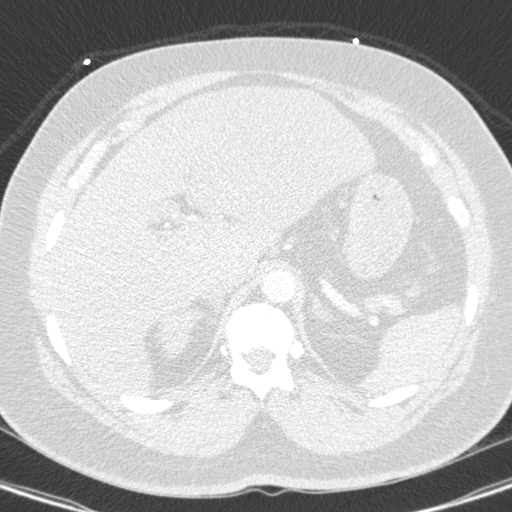
[im 39/290  mediastinal]
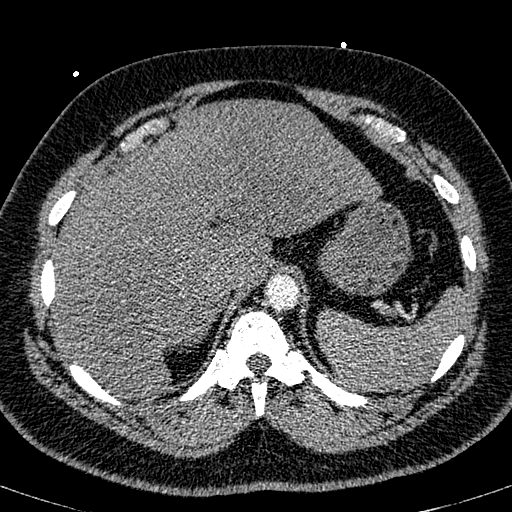
[im 58/290  lung]
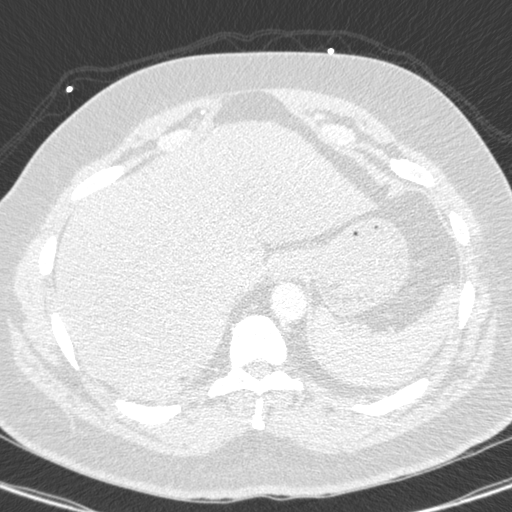
[im 78/290  mediastinal]
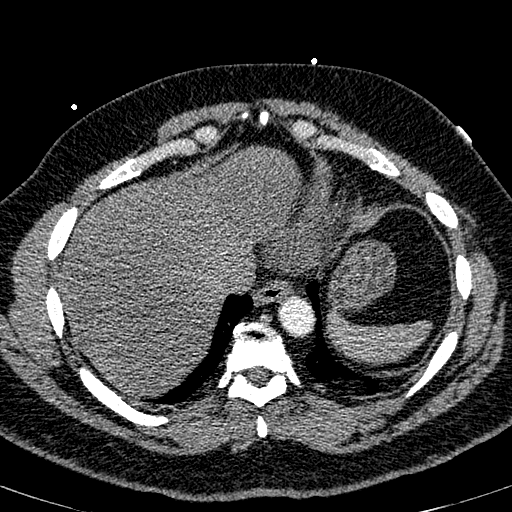
[im 97/290  lung]
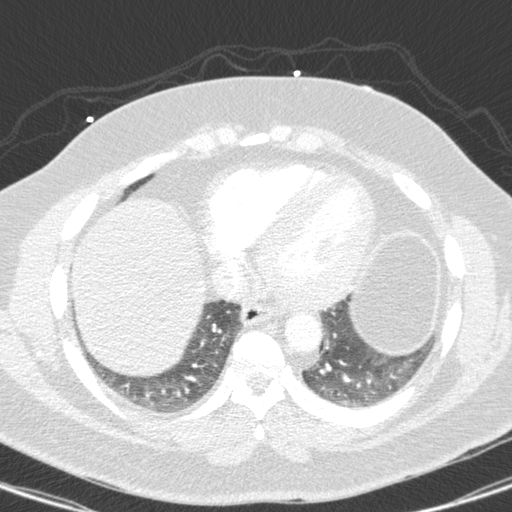
[im 116/290  mediastinal]
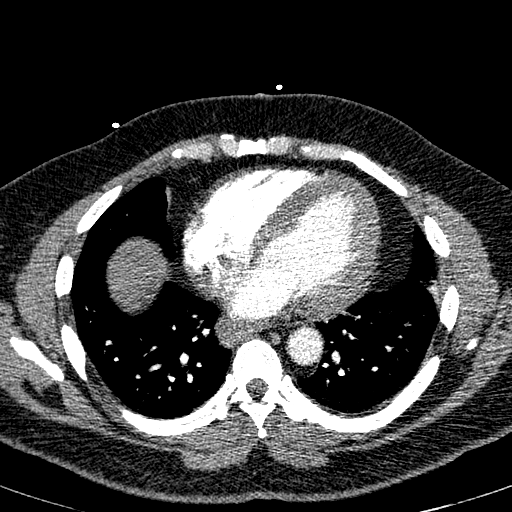
[im 135/290  lung]
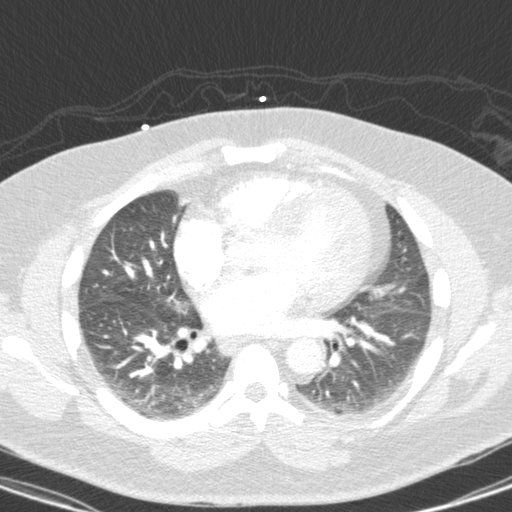
[im 155/290  mediastinal]
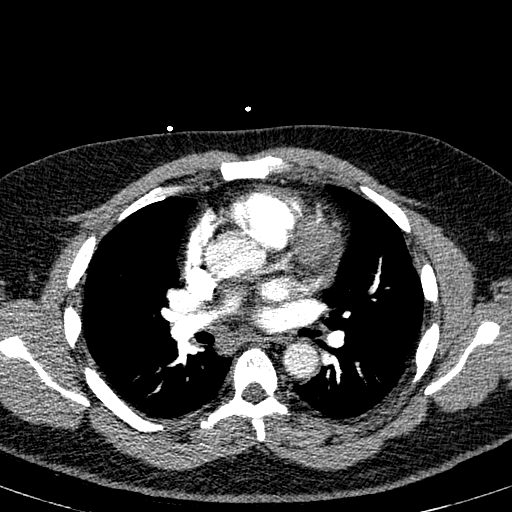
[im 174/290  lung]
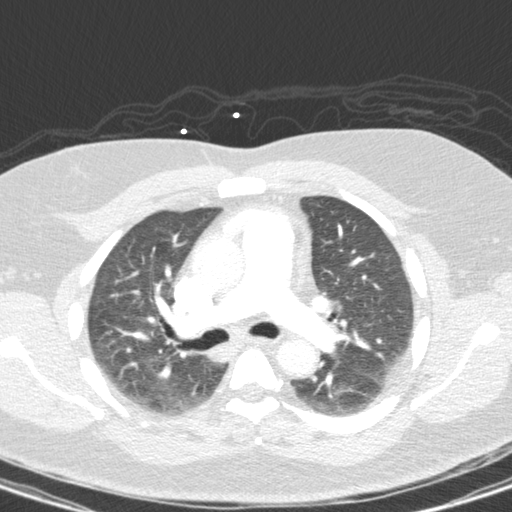
[im 193/290  mediastinal]
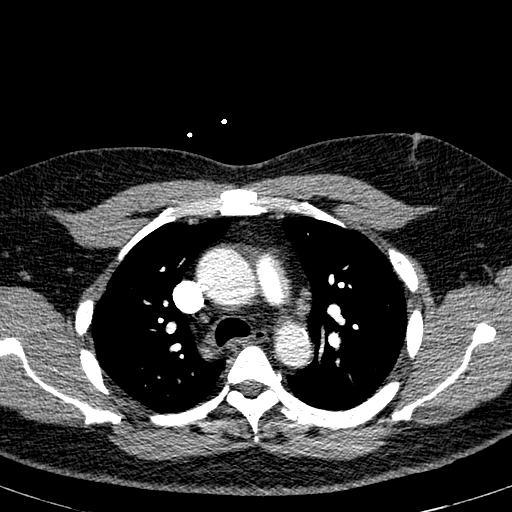
[im 212/290  lung]
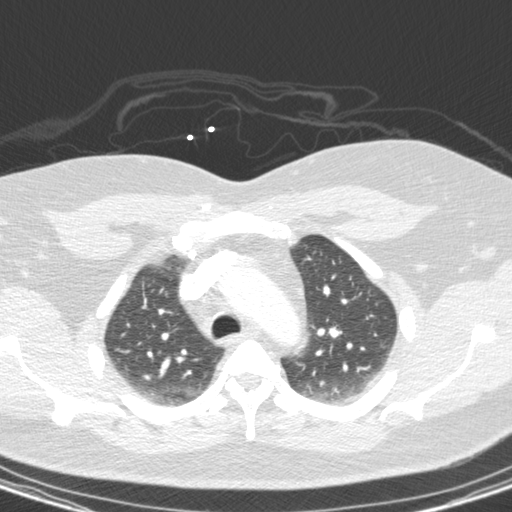
[im 232/290  mediastinal]
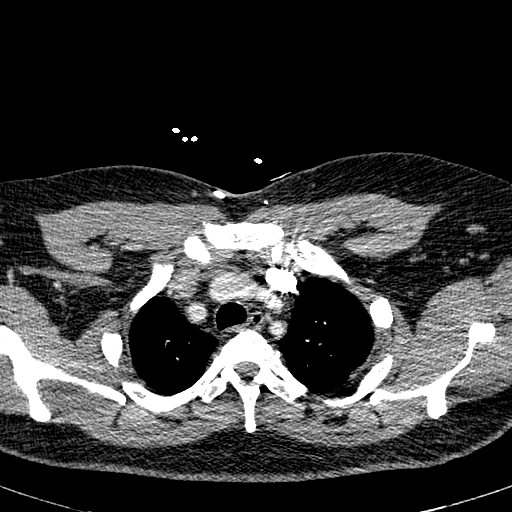
[im 251/290  lung]
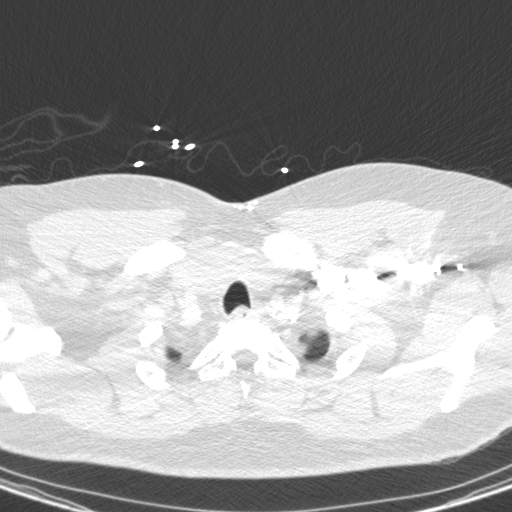
[im 270/290  mediastinal]
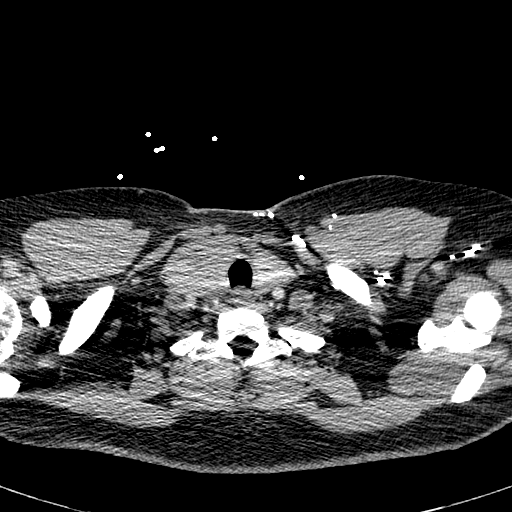

[14 of 36 positions shown; findings below may reference images not displayed]

FINDINGS: There are no filling defects within the pulmonary arteries to
suggest pulmonary embolus.

Thoracic aorta normal in caliber with mild atherosclerosis.
Scattered coronary artery calcifications. Heart upper limits of
normal in size. No mediastinal or hilar adenopathy. No pleural or
pericardial effusion.

Heterogeneous diffuse enlargement of the right lobe of the thyroid
gland without CT defined nodule.

Scattered atelectasis and hypoventilatory change. There is
subpleural fat in the left lung base.

Evaluation of the upper abdomen demonstrates no acute abnormality.
Liver appears enlarged with steatosis.

There are no acute or suspicious osseous abnormalities.

Review of the MIP images confirms the above findings.
IMPRESSION: 1. No pulmonary embolus or acute intrathoracic process.
2. Coronary artery calcifications.
3. Incidental findings of heterogeneous enlargement of the right
thyroid gland, no CT defined nodule, suspect goiter.
4. Hepatic steatosis.

## 2015-03-16 NOTE — Patient Instructions (Addendum)
Medication Instructions:  Your physician recommends that you continue on your current medications as directed. Please refer to the Current Medication list given to you today.   Labwork: none  Testing/Procedures: Your physician has requested that you have an echocardiogram. Echocardiography is a painless test that uses sound waves to create images of your heart. It provides your doctor with information about the size and shape of your heart and how well your heart's chambers and valves are working. This procedure takes approximately one hour. There are no restrictions for this procedure. SCHEDULED NEXT WEEK  Your physician has requested that you have en exercise stress myoview. For further information please visit [email or web address]. Please follow instruction sheet, as given. SCHEDULE NEXT WEEK   Follow-Up: Your physician recommends that you schedule a follow-up appointment in: [place]   Any Other Special Instructions Will Be Listed Below (If Applicable).     If you need a refill on your cardiac medications before your next appointment, please call your pharmacy.

## 2015-03-16 NOTE — Discharge Instructions (Signed)

## 2015-03-16 NOTE — Assessment & Plan Note (Signed)
History of hyperlipidemia on pravastatin followed by his PCP 

## 2015-03-16 NOTE — Progress Notes (Signed)
03/16/2015 [name]   [birth year]  [id number]  Primary Physician No primary care provider on file. Primary Cardiologist: Lorretta Harp MD Renae Gloss   HPI:  Mr. 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 He was referred by [facility] emergency room for cardiovascular evaluation because of chest pressure and dyspnea. She has a history of tobacco abuse smoking one half pack per day for 15 years. History of hypertension, hypokalemia and diabetes. There is no family history. He is complaining of chest pressure and dyspnea over the years worse over the last 10 days. He was seen in the emergency room last night when he was evaluated and ruled out. A CT scan was negative for pulmonary embolism but did show coronary calcification. Cardiac biomarkers  were negative and EKG showed no acute changes.   Current Outpatient Prescriptions  Medication Sig Dispense Refill  . Alogliptin-Pioglitazone (OSENI) 25-30 MG TABS Take 1 tablet by mouth daily.    Marland Kitchen amLODipine (NORVASC) 10 MG tablet Take 10 mg by mouth daily.  3  . lisinopril (PRINIVIL,ZESTRIL) 10 MG tablet Take 10 mg by mouth daily.    . pravastatin (PRAVACHOL) 10 MG tablet Take 10 mg by mouth daily.     No current facility-administered medications for this visit.    Allergies  Allergen Reactions  . Aspirin Other (See Comments)    Upset stomach      Social History   Social History  . Marital Status: Married    Spouse Name: N/A  . Number of Children: N/A  . Years of Education: N/A   Occupational History  . Not on file.   Social History Main Topics  . Smoking status: Current Every Day Smoker -- 0.50 packs/day    Types: Cigarettes  . Smokeless tobacco: Not on file  . Alcohol Use: Yes     Comment: weekend  . Drug Use: Yes    Special: Marijuana  . Sexual Activity: Not on file   Other Topics Concern  . Not on file    Social History Narrative     Review of Systems: General: negative for chills, fever, night sweats or weight changes.  Cardiovascular: negative for chest pain, dyspnea on exertion, edema, orthopnea, palpitations, paroxysmal nocturnal dyspnea or shortness of breath Dermatological: negative for rash Respiratory: negative for cough or wheezing Urologic: negative for hematuria Abdominal: negative for nausea, vomiting, diarrhea, bright red blood per rectum, melena, or hematemesis Neurologic: negative for visual changes, syncope, or dizziness All other systems reviewed and are otherwise negative except as noted above.    Blood pressure 122/80, pulse 100, height 5\' 7"  (1.702 m), weight 230 lb (104.327 kg).  General appearance: alert and no distress Neck: no adenopathy, no carotid bruit, no JVD, supple, symmetrical, trachea midline and thyroid not enlarged, symmetric, no tenderness/mass/nodules Lungs: clear to auscultation bilaterally Heart: regular rate and rhythm, S1, S2 normal, no murmur, click, rub or gallop Extremities: extremities normal, atraumatic, no cyanosis or edema  EKG not performed today  ASSESSMENT AND PLAN:   Essential hypertension History of hypertension blood pressure measurements at 122/80. He is on lisinopril and amlodipine. Continue current meds at current dosing  Hyperlipidemia History of hyperlipidemia on pravastatin followed by his PCP  Dyspnea on exertion Patient was seen in the emergency room last night for dyspnea and chest pressure. Workup was negative including a CT and REM that ruled out pulmonary was in,  EKG and cardiac biomarkers. He does have a history of treated hypertension, diabetes and hyperlipidemia. He smokes one half pack per day. He's had dyspnea and chest pressure for years which have gotten worse over the last 10 days. He was found to have coronary calcification on his chest CT. Based on this, I recommended that we proceed with 2-D echocardiogram  and exercise Myoview stress test to rule out an ischemic etiology.      Lorretta Harp MD FACP,FACC,FAHA, [facility] 03/16/2015 4:02 PM

## 2015-03-16 NOTE — Telephone Encounter (Signed)
Walked into office.  Was told to follow up with cardiology and had to come today because he has been working a new job and has missed a lot and today is the only day that he can come.  Patient saw his own doctor and then sent to the ED and was evaluated discharged at 0100 today.  Told patient we did not have an opening for today but would work with him to get him in to be seen. Patient did not want to do this and walked out.  After his exit registration showed me an email showing openings at [facility] and [facility] to fill all holes in the schedule. Scheduled an appointment. Called patient's phone number on his demographics and left a message on his voice mail telling him to call our office [phone]) and ask for triage. Left message on voice mail that we have an appointment for him at [facility] at 330pm today with Dr. Gwenlyn Found  Will continue to attempt to get a hold of the patient through the day. If  I can not reach will select an appointment for Tuesday on an available hole giving [place] time to reach patient

## 2015-03-16 NOTE — Assessment & Plan Note (Signed)
History of hypertension blood pressure measurements at 122/80. He is on lisinopril and amlodipine. Continue current meds at current dosing

## 2015-03-16 NOTE — Telephone Encounter (Signed)
Patient called back. He is agreeable to see Dr. Gwenlyn Found today at [facility] office at 330pm He is appreciative and thankful

## 2015-03-16 NOTE — Assessment & Plan Note (Signed)
Patient was seen in the emergency room last night for dyspnea and chest pressure. Workup was negative including a CT and REM that ruled out pulmonary was in, EKG and cardiac biomarkers. He does have a history of treated hypertension, diabetes and hyperlipidemia. He smokes one half pack per day. He's had dyspnea and chest pressure for years which have gotten worse over the last 10 days. He was found to have coronary calcification on his chest CT. Based on this, I recommended that we proceed with 2-D echocardiogram and exercise Myoview stress test to rule out an ischemic etiology.

## 2015-03-17 ENCOUNTER — Encounter (HOSPITAL_COMMUNITY): Payer: Self-pay

## 2015-03-17 ENCOUNTER — Inpatient Hospital Stay (HOSPITAL_COMMUNITY)
Admission: EM | Admit: 2015-03-17 | Discharge: 2015-03-23 | DRG: 246 | Disposition: A | Discharge status: Home / Self Care | Payer: BLUE CROSS/BLUE SHIELD | Attending: Cardiovascular Disease | Admitting: Cardiovascular Disease

## 2015-03-17 DIAGNOSIS — L7632 Postprocedural hematoma of skin and subcutaneous tissue following other procedure: Secondary | ICD-10-CM | POA: Diagnosis not present

## 2015-03-17 DIAGNOSIS — K760 Fatty (change of) liver, not elsewhere classified: Secondary | ICD-10-CM | POA: Diagnosis present

## 2015-03-17 DIAGNOSIS — I25119 Atherosclerotic heart disease of native coronary artery with unspecified angina pectoris: Secondary | ICD-10-CM | POA: Diagnosis present

## 2015-03-17 DIAGNOSIS — E7800 Pure hypercholesterolemia, unspecified: Secondary | ICD-10-CM | POA: Diagnosis present

## 2015-03-17 DIAGNOSIS — Y839 Surgical procedure, unspecified as the cause of abnormal reaction of the patient, or of later complication, without mention of misadventure at the time of the procedure: Secondary | ICD-10-CM | POA: Diagnosis not present

## 2015-03-17 DIAGNOSIS — I214 Non-ST elevation (NSTEMI) myocardial infarction: Secondary | ICD-10-CM | POA: Diagnosis present

## 2015-03-17 DIAGNOSIS — E1159 Type 2 diabetes mellitus with other circulatory complications: Secondary | ICD-10-CM | POA: Diagnosis present

## 2015-03-17 DIAGNOSIS — E876 Hypokalemia: Secondary | ICD-10-CM | POA: Diagnosis not present

## 2015-03-17 DIAGNOSIS — Z6835 Body mass index (BMI) 35.0-35.9, adult: Secondary | ICD-10-CM

## 2015-03-17 DIAGNOSIS — E1169 Type 2 diabetes mellitus with other specified complication: Secondary | ICD-10-CM | POA: Diagnosis present

## 2015-03-17 DIAGNOSIS — E119 Type 2 diabetes mellitus without complications: Secondary | ICD-10-CM | POA: Diagnosis present

## 2015-03-17 DIAGNOSIS — I10 Essential (primary) hypertension: Secondary | ICD-10-CM | POA: Diagnosis present

## 2015-03-17 DIAGNOSIS — F17210 Nicotine dependence, cigarettes, uncomplicated: Secondary | ICD-10-CM | POA: Diagnosis present

## 2015-03-17 DIAGNOSIS — Z720 Tobacco use: Secondary | ICD-10-CM | POA: Diagnosis present

## 2015-03-17 DIAGNOSIS — Z79899 Other long term (current) drug therapy: Secondary | ICD-10-CM

## 2015-03-17 DIAGNOSIS — R079 Chest pain, unspecified: Secondary | ICD-10-CM

## 2015-03-17 DIAGNOSIS — I2510 Atherosclerotic heart disease of native coronary artery without angina pectoris: Secondary | ICD-10-CM | POA: Diagnosis not present

## 2015-03-17 DIAGNOSIS — I152 Hypertension secondary to endocrine disorders: Secondary | ICD-10-CM | POA: Diagnosis present

## 2015-03-17 DIAGNOSIS — I2542 Coronary artery dissection: Secondary | ICD-10-CM | POA: Diagnosis not present

## 2015-03-17 DIAGNOSIS — E782 Mixed hyperlipidemia: Secondary | ICD-10-CM | POA: Diagnosis not present

## 2015-03-17 DIAGNOSIS — E669 Obesity, unspecified: Secondary | ICD-10-CM | POA: Diagnosis present

## 2015-03-17 DIAGNOSIS — Z955 Presence of coronary angioplasty implant and graft: Secondary | ICD-10-CM | POA: Diagnosis present

## 2015-03-17 DIAGNOSIS — Y840 Cardiac catheterization as the cause of abnormal reaction of the patient, or of later complication, without mention of misadventure at the time of the procedure: Secondary | ICD-10-CM | POA: Diagnosis not present

## 2015-03-17 DIAGNOSIS — I229 Subsequent ST elevation (STEMI) myocardial infarction of unspecified site: Secondary | ICD-10-CM | POA: Diagnosis not present

## 2015-03-17 DIAGNOSIS — E781 Pure hyperglyceridemia: Secondary | ICD-10-CM | POA: Diagnosis present

## 2015-03-17 DIAGNOSIS — Z886 Allergy status to analgesic agent status: Secondary | ICD-10-CM | POA: Diagnosis not present

## 2015-03-17 DIAGNOSIS — E1149 Type 2 diabetes mellitus with other diabetic neurological complication: Secondary | ICD-10-CM

## 2015-03-17 DIAGNOSIS — E785 Hyperlipidemia, unspecified: Secondary | ICD-10-CM | POA: Diagnosis present

## 2015-03-17 HISTORY — DX: Type 2 diabetes mellitus with other specified complication: E11.69

## 2015-03-17 HISTORY — DX: Atherosclerotic heart disease of native coronary artery without angina pectoris: I25.10

## 2015-03-17 HISTORY — DX: Obesity, unspecified: E66.9

## 2015-03-17 HISTORY — DX: Non-ST elevation (NSTEMI) myocardial infarction: I21.4

## 2015-03-17 HISTORY — DX: Tobacco use: Z72.0

## 2015-03-17 LAB — BRAIN NATRIURETIC PEPTIDE: B NATRIURETIC PEPTIDE 5: 24.3 pg/mL (ref 0.0–100.0)

## 2015-03-17 LAB — BASIC METABOLIC PANEL
ANION GAP: 15 (ref 5–15)
BUN: 20 mg/dL (ref 6–20)
CALCIUM: 9.9 mg/dL (ref 8.9–10.3)
CO2: 25 mmol/L (ref 22–32)
Chloride: 94 mmol/L — ABNORMAL LOW (ref 101–111)
Creatinine, Ser: 1.23 mg/dL (ref 0.61–1.24)
Glucose, Bld: 318 mg/dL — ABNORMAL HIGH (ref 65–99)
POTASSIUM: 4.7 mmol/L (ref 3.5–5.1)
SODIUM: 134 mmol/L — AB (ref 135–145)

## 2015-03-17 LAB — GLUCOSE, CAPILLARY
GLUCOSE-CAPILLARY: 313 mg/dL — AB (ref 65–99)
Glucose-Capillary: 248 mg/dL — ABNORMAL HIGH (ref 65–99)
Glucose-Capillary: 281 mg/dL — ABNORMAL HIGH (ref 65–99)
Glucose-Capillary: 185 mg/dL — ABNORMAL HIGH (ref 65–99)

## 2015-03-17 LAB — CBC
HCT: 44.1 % (ref 39.0–52.0)
HEMATOCRIT: 41.1 % (ref 39.0–52.0)
HEMOGLOBIN: 14 g/dL (ref 13.0–17.0)
Hemoglobin: 15 g/dL (ref 13.0–17.0)
MCH: 28.9 pg (ref 26.0–34.0)
MCH: 28.6 pg (ref 26.0–34.0)
MCHC: 34 g/dL (ref 30.0–36.0)
MCHC: 34.1 g/dL (ref 30.0–36.0)
MCV: 85 fL (ref 78.0–100.0)
MCV: 83.9 fL (ref 78.0–100.0)
PLATELETS: 268 10*3/uL (ref 150–400)
Platelets: 252 10*3/uL (ref 150–400)
RBC: 5.19 MIL/uL (ref 4.22–5.81)
RBC: 4.9 MIL/uL (ref 4.22–5.81)
RDW: 13.8 % (ref 11.5–15.5)
RDW: 13.7 % (ref 11.5–15.5)
WBC: 13.6 10*3/uL — AB (ref 4.0–10.5)
WBC: 11.7 10*3/uL — ABNORMAL HIGH (ref 4.0–10.5)

## 2015-03-17 LAB — I-STAT TROPONIN, ED: TROPONIN I, POC: 0.11 ng/mL — AB (ref 0.00–0.08)

## 2015-03-17 LAB — HEPATIC FUNCTION PANEL
ALT: 16 U/L — AB (ref 17–63)
AST: 20 U/L (ref 15–41)
Albumin: 3.9 g/dL (ref 3.5–5.0)
Alkaline Phosphatase: 108 U/L (ref 38–126)
BILIRUBIN DIRECT: 0.1 mg/dL (ref 0.1–0.5)
BILIRUBIN INDIRECT: 0.9 mg/dL (ref 0.3–0.9)
BILIRUBIN TOTAL: 1 mg/dL (ref 0.3–1.2)
Total Protein: 7.5 g/dL (ref 6.5–8.1)

## 2015-03-17 LAB — TROPONIN I
TROPONIN I: 0.78 ng/mL — AB (ref <0.031)
TROPONIN I: 0.88 ng/mL — AB (ref <0.031)
Troponin I: 0.13 ng/mL — ABNORMAL HIGH (ref <0.031)
Troponin I: 0.25 ng/mL — ABNORMAL HIGH (ref <0.031)
Troponin I: 0.63 ng/mL (ref <0.031)

## 2015-03-17 LAB — MAGNESIUM: MAGNESIUM: 1.8 mg/dL (ref 1.7–2.4)

## 2015-03-17 LAB — CREATININE, SERUM
CREATININE: 1.27 mg/dL — AB (ref 0.61–1.24)
GFR calc Af Amer: >60 mL/min (ref >60)
GFR calc non Af Amer: >60 mL/min (ref >60)

## 2015-03-17 MED ORDER — ASPIRIN 81 MG PO CHEW
162.0000 mg | CHEWABLE_TABLET | Freq: Once | ORAL | Status: AC
  Administered 2015-03-17: 162 mg via ORAL

## 2015-03-17 MED ORDER — ATORVASTATIN CALCIUM 80 MG PO TABS
80.0000 mg | ORAL_TABLET | Freq: Every day | ORAL | Status: DC
  Administered 2015-03-17 – 2015-03-22 (×6): 80 mg via ORAL
  Filled 2015-03-17 (×7): qty 1

## 2015-03-17 MED ORDER — ASPIRIN 325 MG PO TABS
ORAL_TABLET | ORAL | Status: AC
  Filled 2015-03-17: qty 1

## 2015-03-17 MED ORDER — NITROGLYCERIN 0.4 MG SL SUBL
0.4000 mg | SUBLINGUAL_TABLET | SUBLINGUAL | Status: DC | PRN
  Administered 2015-03-17: 0.4 mg via SUBLINGUAL
  Filled 2015-03-17 (×2): qty 1

## 2015-03-17 MED ORDER — SODIUM CHLORIDE 0.9% FLUSH
3.0000 mL | Freq: Two times a day (BID) | INTRAVENOUS | Status: DC
  Administered 2015-03-17 – 2015-03-18 (×2): 3 mL via INTRAVENOUS

## 2015-03-17 MED ORDER — SODIUM CHLORIDE 0.9 % WEIGHT BASED INFUSION: 1.0000 mL/kg/h | INTRAVENOUS | Status: DC

## 2015-03-17 MED ORDER — ASPIRIN 81 MG PO CHEW
CHEWABLE_TABLET | ORAL | Status: AC
  Filled 2015-03-17: qty 2

## 2015-03-17 MED ORDER — SODIUM CHLORIDE 0.9 % IV SOLN: 250.0000 mL | INTRAVENOUS | Status: DC | PRN

## 2015-03-17 MED ORDER — ENOXAPARIN SODIUM 100 MG/ML ~~LOC~~ SOLN
100.0000 mg | Freq: Two times a day (BID) | SUBCUTANEOUS | Status: DC
  Administered 2015-03-17 – 2015-03-18 (×3): 100 mg via SUBCUTANEOUS
  Filled 2015-03-17 (×3): qty 1

## 2015-03-17 MED ORDER — ENOXAPARIN SODIUM 40 MG/0.4ML ~~LOC~~ SOLN
40.0000 mg | Freq: Every day | SUBCUTANEOUS | Status: DC
  Administered 2015-03-17: 40 mg via SUBCUTANEOUS
  Filled 2015-03-17: qty 0.4

## 2015-03-17 MED ORDER — SODIUM CHLORIDE 0.9% FLUSH: 3.0000 mL | INTRAVENOUS | Status: DC | PRN

## 2015-03-17 MED ORDER — SODIUM CHLORIDE 0.9% FLUSH
3.0000 mL | Freq: Two times a day (BID) | INTRAVENOUS | Status: DC
Start: 2015-03-17 — End: 2015-03-19
  Administered 2015-03-17 – 2015-03-18 (×2): 3 mL via INTRAVENOUS

## 2015-03-17 MED ORDER — CARVEDILOL 3.125 MG PO TABS
3.1250 mg | ORAL_TABLET | Freq: Two times a day (BID) | ORAL | Status: DC
  Administered 2015-03-17 – 2015-03-20 (×7): 3.125 mg via ORAL
  Filled 2015-03-17 (×8): qty 1

## 2015-03-17 MED ORDER — ONDANSETRON HCL 4 MG/2ML IJ SOLN
4.0000 mg | Freq: Four times a day (QID) | INTRAMUSCULAR | Status: DC | PRN
  Administered 2015-03-20: 4 mg via INTRAVENOUS

## 2015-03-17 MED ORDER — INSULIN ASPART 100 UNIT/ML ~~LOC~~ SOLN
0.0000 [IU] | Freq: Three times a day (TID) | SUBCUTANEOUS | Status: DC
  Administered 2015-03-17 – 2015-03-18 (×4): 5 [IU] via SUBCUTANEOUS
  Administered 2015-03-19: 1 [IU] via SUBCUTANEOUS
  Administered 2015-03-19: 5 [IU] via SUBCUTANEOUS
  Administered 2015-03-19 – 2015-03-20 (×2): 2 [IU] via SUBCUTANEOUS
  Administered 2015-03-20: 3 [IU] via SUBCUTANEOUS
  Administered 2015-03-20: 07:00:00 2 [IU] via SUBCUTANEOUS

## 2015-03-17 MED ORDER — LISINOPRIL 10 MG PO TABS
10.0000 mg | ORAL_TABLET | Freq: Every day | ORAL | Status: DC
  Administered 2015-03-17 – 2015-03-23 (×6): 10 mg via ORAL
  Filled 2015-03-17 (×6): qty 1

## 2015-03-17 MED ORDER — ASPIRIN 325 MG PO TABS: 325.0000 mg | ORAL_TABLET | Freq: Once | ORAL | Status: DC

## 2015-03-17 MED ORDER — PRAVASTATIN SODIUM 20 MG PO TABS: 20.0000 mg | ORAL_TABLET | Freq: Every day | ORAL | Status: DC

## 2015-03-17 MED ORDER — ACETAMINOPHEN 325 MG PO TABS: 650.0000 mg | ORAL_TABLET | ORAL | Status: DC | PRN

## 2015-03-17 MED ORDER — SODIUM CHLORIDE 0.9 % WEIGHT BASED INFUSION: 3.0000 mL/kg/h | INTRAVENOUS | Status: DC

## 2015-03-17 MED ORDER — ASPIRIN EC 81 MG PO TBEC
81.0000 mg | DELAYED_RELEASE_TABLET | Freq: Every day | ORAL | Status: DC
  Administered 2015-03-18 – 2015-03-21 (×4): 81 mg via ORAL
  Filled 2015-03-17 (×5): qty 1

## 2015-03-17 MED ORDER — ASPIRIN 81 MG PO CHEW
81.0000 mg | CHEWABLE_TABLET | ORAL | Status: AC
  Administered 2015-03-19: 81 mg via ORAL
  Filled 2015-03-17: qty 1

## 2015-03-17 NOTE — Progress Notes (Signed)
Lab called critical value: Troponin = 0.63.

## 2015-03-17 NOTE — ED Provider Notes (Signed)
CSN: [id number]     Arrival date & time 03/17/15  0039 History  By signing my name below, I, Emmanuella Mensah, attest that this documentation has been prepared under the direction and in the presence of Everlene Balls, MD. Electronically Signed: Judithann Sauger, ED Scribe. 03/17/2015. 1:22 AM.      Chief Complaint  Patient presents with  . Chest Pain  . Shortness of Breath   The history is provided by the patient. No language interpreter was used.   HPI Comments: [name] is a 55 y.o. male who presents to the Emergency Department complaining of gradually worsening ongoing dull substernal CP onset 10 days ago. He reports associated SOB and diaphoresis. He states that the pain is worse with movement but deep breathing provides relief. He reports that he took an unknown amount of Aspirin PTA. Pt was seen here yesterday and had a CT scan. He explains that he was prescribed Prilosec for his high cholesterol approx 2 years ago and he believes that he has not experienced these CPs since. He states that he went to a Cardiologist today and has an upcoming appointment in one week. No fever, chills, cough, or n/v.    Past Medical History  Diagnosis Date  . Hypertension   . Diverticulosis   . Hypercholesteremia   . Diabetes mellitus without complication ([place])   . Dyspnea on exertion   . Chest pressure    History reviewed. No pertinent past surgical history. No family history on file. Social History  Substance Use Topics  . Smoking status: Current Every Day Smoker -- 0.50 packs/day    Types: Cigarettes  . Smokeless tobacco: None  . Alcohol Use: Yes     Comment: weekend    Review of Systems  Constitutional: Positive for diaphoresis. Negative for fever and chills.  Respiratory: Positive for shortness of breath.   Cardiovascular: Positive for chest pain.  Gastrointestinal: Negative for nausea and vomiting.  All other systems reviewed and are negative.  A complete 10 system review of systems  was obtained and all systems are negative except as noted in the HPI and PMH.    Allergies  Aspirin  Home Medications   Prior to Admission medications   Medication Sig Start Date End Date Taking? Authorizing Provider  Alogliptin-Pioglitazone (OSENI) 25-30 MG TABS Take 1 tablet by mouth daily.    Historical Provider, MD  amLODipine (NORVASC) 10 MG tablet Take 10 mg by mouth daily. 12/20/14   Historical Provider, MD  lisinopril (PRINIVIL,ZESTRIL) 10 MG tablet Take 10 mg by mouth daily.    Historical Provider, MD  pravastatin (PRAVACHOL) 10 MG tablet Take 10 mg by mouth daily.    Historical Provider, MD   There were no vitals taken for this visit. Physical Exam  Constitutional: He is oriented to person, place, and time. Vital signs are normal. He appears well-developed and well-nourished.  Non-toxic appearance. He does not appear ill. No distress.  HENT:  Head: Normocephalic and atraumatic.  Nose: Nose normal.  Mouth/Throat: Oropharynx is clear and moist. No oropharyngeal exudate.  Eyes: Conjunctivae and EOM are normal. Pupils are equal, round, and reactive to light. No scleral icterus.  Neck: Normal range of motion. Neck supple. No tracheal deviation, no edema, no erythema and normal range of motion present. No thyroid mass and no thyromegaly present.  Cardiovascular: Normal rate, regular rhythm, S1 normal, S2 normal, normal heart sounds, intact distal pulses and normal pulses.  Exam reveals no gallop and no friction rub.  No murmur heard. Pulmonary/Chest: Effort normal and breath sounds normal. No respiratory distress. He has no wheezes. He has no rhonchi. He has no rales.  Abdominal: Soft. Normal appearance and bowel sounds are normal. He exhibits no distension, no ascites and no mass. There is no hepatosplenomegaly. There is no tenderness. There is no rebound, no guarding and no CVA tenderness.  Musculoskeletal: Normal range of motion. He exhibits no edema or tenderness.   Lymphadenopathy:    He has no cervical adenopathy.  Neurological: He is alert and oriented to person, place, and time. He has normal strength. No cranial nerve deficit or sensory deficit.  Skin: Skin is warm, dry and intact. No petechiae and no rash noted. He is not diaphoretic. No erythema. No pallor.  Psychiatric: He has a normal mood and affect. His behavior is normal. Judgment normal.  Nursing note and vitals reviewed.   ED Course  Procedures (including critical care time) DIAGNOSTIC STUDIES: Oxygen Saturation is 97% on RA, normal by my interpretation.    COORDINATION OF CARE: 1:13 AM- Pt advised of plan for treatment and pt agrees. Pt informed of changes on EKG.    Labs Review Labs Reviewed  BASIC METABOLIC PANEL - Abnormal; Notable for the following:    Sodium 134 (*)    Chloride 94 (*)    Glucose, Bld 318 (*)    All other components within normal limits  CBC - Abnormal; Notable for the following:    WBC 13.6 (*)    All other components within normal limits  TROPONIN I - Abnormal; Notable for the following:    Troponin I 0.13 (*)    All other components within normal limits  CBC - Abnormal; Notable for the following:    WBC 11.7 (*)    All other components within normal limits  I-STAT TROPOININ, ED - Abnormal; Notable for the following:    Troponin i, poc 0.11 (*)    All other components within normal limits  CREATININE, SERUM  HEMOGLOBIN A1C  BRAIN NATRIURETIC PEPTIDE  MAGNESIUM  TROPONIN I  TROPONIN I    Imaging Review Dg Chest 2 View  03/15/2015  CLINICAL DATA:  Chest pain and shortness of breath today. Initial encounter. EXAM: CHEST  2 VIEW COMPARISON:  PA and lateral chest 01/22/2012. FINDINGS: The lungs are clear. Heart size is normal. No pneumothorax or pleural effusion. No focal bony abnormality. IMPRESSION: Negative chest. Electronically Signed   By: Inge Rise M.D.   On: 03/15/2015 19:19   Ct Angio Chest Pe W/cm &/or Wo Cm  03/16/2015  CLINICAL  DATA:  Chest pain and shortness of breath for 10 days. EXAM: CT ANGIOGRAPHY CHEST WITH CONTRAST TECHNIQUE: Multidetector CT imaging of the chest was performed using the standard protocol during bolus administration of intravenous contrast. Multiplanar CT image reconstructions and MIPs were obtained to evaluate the vascular anatomy. CONTRAST:  175mL OMNIPAQUE IOHEXOL 350 MG/ML SOLN COMPARISON:  Radiographs 5 hours prior. FINDINGS: There are no filling defects within the pulmonary arteries to suggest pulmonary embolus. Thoracic aorta normal in caliber with mild atherosclerosis. Scattered coronary artery calcifications. Heart upper limits of normal in size. No mediastinal or hilar adenopathy. No pleural or pericardial effusion. Heterogeneous diffuse enlargement of the right lobe of the thyroid gland without CT defined nodule. Scattered atelectasis and hypoventilatory change. There is subpleural fat in the left lung base. Evaluation of the upper abdomen demonstrates no acute abnormality. Liver appears enlarged with steatosis. There are no acute or suspicious osseous abnormalities.  Review of the MIP images confirms the above findings. IMPRESSION: 1. No pulmonary embolus or acute intrathoracic process. 2. Coronary artery calcifications. 3. Incidental findings of heterogeneous enlargement of the right thyroid gland, no CT defined nodule, suspect goiter. 4. Hepatic steatosis. Electronically Signed   By: Jeb Levering M.D.   On: 03/16/2015 00:35   Everlene Balls, MD has personally reviewed and evaluated these images and lab results as part of his medical decision-making.   EKG Interpretation   Date/Time:  Saturday March 17 2015 00:50:56 EST Ventricular Rate:  82 PR Interval:  142 QRS Duration: 105 QT Interval:  395 QTC Calculation: 461 R Axis:   71 Text Interpretation:  Normal sinus rhythm Abnormal T, consider ischemia,  diffuse leads Minimal ST elevation, anterior leads new TWI V3-V5 Confirmed  by Glynn Octave [phone]) on 03/17/2015 12:55:13 AM      MDM   Final diagnoses:  None   Patient presents to the ED for chest pain.  History is concerning for ACS.  He states it is exertional and relieved with rest.  He has associated diaphoresis and SOB.  In addition there are new TWI on his EKG.  He certainly will require admission for cardiac work up.  He was given ASA in the ED.  Currently CP free after EMS nitro.  Troponin is 0.11, cardiology has accepted the patient for admission.   CRITICAL CARE Performed by: Everlene Balls   Total critical care time: 40 minutes - NSTEMI  Critical care time was exclusive of separately billable procedures and treating other patients.  Critical care was necessary to treat or prevent imminent or life-threatening deterioration.  Critical care was time spent personally by me on the following activities: development of treatment plan with patient and/or surrogate as well as nursing, discussions with consultants, evaluation of patient's response to treatment, examination of patient, obtaining history from patient or surrogate, ordering and performing treatments and interventions, ordering and review of laboratory studies, ordering and review of radiographic studies, pulse oximetry and re-evaluation of patient's condition.   I personally performed the services described in this documentation, which was scribed in my presence. The recorded information has been reviewed and is accurate.     Everlene Balls, MD 03/17/15 [phone]

## 2015-03-17 NOTE — Progress Notes (Signed)
Tx paged PA on call critical Troponin level = 0.63.

## 2015-03-17 NOTE — ED Notes (Signed)
Pt reports he can take aspirin and is willing to take it.

## 2015-03-17 NOTE — Progress Notes (Signed)
Informed patient that the stress test has been canceled, diet change entered into the computer, that he may eat/ lunch ordered.

## 2015-03-17 NOTE — Progress Notes (Signed)
Primary cardiologist: Dr. Quay Burow  Seen for followup: Chest pain and abnormal troponin I  Subjective:    No active chest pain now. Did have some recurrent chest pressure with shortness of breath yesterday requiring nitroglycerin. Patient voiced frustration with his current situation.  Objective:   Temp:  [97.8 F (36.6 C)-98.3 F (36.8 C)] 97.8 F (36.6 C) (02/04 0414) Pulse Rate:  [86-100] 88 (02/04 0414) Resp:  [10-19] 18 (02/04 0414) BP: (122-162)/(73-102) 143/73 mmHg (02/04 0414) SpO2:  [94 %-98 %] 97 % (02/04 0414) Weight:  [228 lb (103.42 kg)-230 lb (104.327 kg)] 228 lb (103.42 kg) (02/04 0414) Last BM Date: 03/16/15  Filed Weights   03/17/15 0414  Weight: 228 lb (103.42 kg)    Intake/Output Summary (Last 24 hours) at 03/17/15 1049 Last data filed at 03/17/15 0700  Gross per 24 hour  Intake      0 ml  Output      1 ml  Net     -1 ml    Telemetry: Sinus rhythm.  Exam:  General: Obese male, appears comfortable at rest.   Lungs: Clear, nonlabored.  Cardiac: RRR without gallop.  Extremities: No pitting edema.  Lab Results:  Basic Metabolic Panel:  Recent Labs Lab 03/15/15 1853 03/17/15 0134 03/17/15 0547  NA 140 134*  --   K 4.8 4.7  --   CL 97* 94*  --   CO2 28 25  --   GLUCOSE 358* 318*  --   BUN 21* 20  --   CREATININE 1.57* 1.23 1.27*  CALCIUM 10.6* 9.9  --   MG  --   --  1.8    CBC:  Recent Labs Lab 03/15/15 1853 03/17/15 0134 03/17/15 0547  WBC 9.9 13.6* 11.7*  HGB 14.4 15.0 14.0  HCT 43.7 44.1 41.1  MCV 85.5 85.0 83.9  PLT 287 268 252    Cardiac Enzymes:  Recent Labs Lab 03/17/15 0301 03/17/15 0547  TROPONINI 0.13* 0.25*    ECG:  Recent tracings reviewed finding sinus rhythm with fairly diffuse but predominantly inferolateral ST-T wave abnormalities, ischemia not excluded.  Chest CT angiogram 03/15/2015: FINDINGS: There are no filling defects within the pulmonary arteries to suggest pulmonary  embolus.  Thoracic aorta normal in caliber with mild atherosclerosis. Scattered coronary artery calcifications. Heart upper limits of normal in size. No mediastinal or hilar adenopathy. No pleural or pericardial effusion.  Heterogeneous diffuse enlargement of the right lobe of the thyroid gland without CT defined nodule.  Scattered atelectasis and hypoventilatory change. There is subpleural fat in the left lung base.  Evaluation of the upper abdomen demonstrates no acute abnormality. Liver appears enlarged with steatosis.  There are no acute or suspicious osseous abnormalities.  Review of the MIP images confirms the above findings.  IMPRESSION: 1. No pulmonary embolus or acute intrathoracic process. 2. Coronary artery calcifications. 3. Incidental findings of heterogeneous enlargement of the right thyroid gland, no CT defined nodule, suspect goiter. 4. Hepatic steatosis.   Medications:   Scheduled Medications: . [START ON 03/18/2015] aspirin EC  81 mg Oral Daily  . carvedilol  3.125 mg Oral BID WC  . enoxaparin (LOVENOX) injection  40 mg Subcutaneous Daily  . lisinopril  10 mg Oral Daily  . pravastatin  20 mg Oral q1800      PRN Medications:  acetaminophen, nitroGLYCERIN, ondansetron (ZOFRAN) IV   Assessment:   1. Patient presents with symptoms concerning for unstable angina including dyspnea and chest tightness. ECG is  abnormal but similar to previous tracings. Troponin I has increased at this point to 0.25, so we will term this NSTEMI until proven otherwise. Recent chest CTA did not show pulmonary embolus. He did have coronary artery calcifications by that study.  2. Essential hypertension. Patient on Norvasc and lisinopril as an outpatient.  3. Hyperlipidemia, on Pravachol. FLP pending.  4. Type 2 diabetes mellitus. Hemoglobin A1c pending.   Plan/Discussion:    I reviewed the patient's chart. He was admitted to the hospital overnight by the fellow  after being seen in the office by Dr. Gwenlyn Found yesterday. Original plan was for outpatient echocardiogram and Myoview, however based on presentation and current objective information I have recommended a cardiac catheterization to more clearly define his coronary anatomy and assess for revascularization options. He is in agreement to proceed. This will be scheduled for Monday morning. Echocardiogram was also ordered. Current medications include aspirin, Coreg, lisinopril, Lovenox and Pravachol.   Satira Sark, M.D., F.A.C.C.

## 2015-03-17 NOTE — Progress Notes (Signed)
Troponin increasing. Will continue cycle troponin. ACS dose of Lovenox. High dose statin. Will cancelled Myoview. Cath Monday. SSI for DM.   Sophi Calligan, [place]

## 2015-03-17 NOTE — H&P (Addendum)
Cardiologist: Dr. Gwenlyn Found  CC: CP   HPI: 55 yo AA  man with HTN, T2DM, HLD, smoker, was seen in the ER on 03/15/2015 for dyspnea and chest discomfort. CTA chest ruled out PE. TnI negative and ECG did not suggest ACS. He was seen by Dr. Gwenlyn Found on 03/17/2015 for initial cardiac evaluation who planned an outpatient echo and myocardial perfusion scan.   He presents to the ER again with CP. Feels like pressure, triggered by activity, improve with rest and nitroglycerine given in the ER. No diaphoresis, syncope, orthopnea, PND, edema. No prior cardiac work up (cath or stress test).    Review of Systems:  12 systems reviewed unremarkable except as noted in HPI    Past Medical History  Diagnosis Date  . Hypertension   . Diverticulosis   . Hypercholesteremia   . Diabetes mellitus without complication ([place])   . Dyspnea on exertion   . Chest pressure    No current facility-administered medications on file prior to encounter.   Current Outpatient Prescriptions on File Prior to Encounter  Medication Sig Dispense Refill  . Alogliptin-Pioglitazone (OSENI) 25-30 MG TABS Take 1 tablet by mouth daily.    Marland Kitchen amLODipine (NORVASC) 10 MG tablet Take 10 mg by mouth daily. Reported on 03/17/2015  3  . lisinopril (PRINIVIL,ZESTRIL) 10 MG tablet Take 10 mg by mouth daily.    . pravastatin (PRAVACHOL) 10 MG tablet Take 10 mg by mouth daily.      Allergies  Allergen Reactions  . Aspirin Other (See Comments)    Upset stomach      Social History   Social History  . Marital Status: Married    Spouse Name: N/A  . Number of Children: N/A  . Years of Education: N/A   Occupational History  . Not on file.   Social History Main Topics  . Smoking status: Current Every Day Smoker -- 0.50 packs/day    Types: Cigarettes  . Smokeless tobacco: Not on file  . Alcohol Use: Yes     Comment: weekend  . Drug Use: Yes    Special: Marijuana  . Sexual Activity: Not on file   Other Topics Concern  . Not on  file   Social History Narrative    No family history on file.  PHYSICAL EXAM: Filed Vitals:   03/17/15 0100  BP: 162/90  Pulse: 93  Resp: 10   General:  Well appearing. No respiratory difficulty HEENT: normal Neck: supple. no JVD. Carotids 2+ bilat; no bruits. No lymphadenopathy or thryomegaly appreciated. Cor: PMI nondisplaced. Regular rate & rhythm. No rubs, gallops or murmurs. Lungs: clear Abdomen: soft, nontender, nondistended. No hepatosplenomegaly. No bruits or masses. Good bowel sounds. Extremities: no cyanosis, clubbing, rash, edema Neuro: alert & oriented x 3, cranial nerves grossly intact. moves all 4 extremities w/o difficulty. Affect pleasant.  ECG: NSR, normal AV conduction, narrow QRS, T inv in inferolateral leads (unchanged since 2013)  Results for orders placed or performed during the hospital encounter of 03/17/15 (from the past 24 hour(s))  Basic metabolic panel     Status: Abnormal   Collection Time: 03/17/15  1:34 AM  Result Value Ref Range   Sodium 134 (L) 135 - 145 mmol/L   Potassium 4.7 3.5 - 5.1 mmol/L   Chloride 94 (L) 101 - 111 mmol/L   CO2 25 22 - 32 mmol/L   Glucose, Bld 318 (H) 65 - 99 mg/dL   BUN 20 6 - 20 mg/dL   Creatinine,  Ser 1.23 0.61 - 1.24 mg/dL   Calcium 9.9 8.9 - 10.3 mg/dL   GFR calc non Af Amer >60 >60 mL/min   GFR calc Af Amer >60 >60 mL/min   Anion gap 15 5 - 15  CBC     Status: Abnormal   Collection Time: 03/17/15  1:34 AM  Result Value Ref Range   WBC 13.6 (H) 4.0 - 10.5 K/uL   RBC 5.19 4.22 - 5.81 MIL/uL   Hemoglobin 15.0 13.0 - 17.0 g/dL   HCT 44.1 39.0 - 52.0 %   MCV 85.0 78.0 - 100.0 fL   MCH 28.9 26.0 - 34.0 pg   MCHC 34.0 30.0 - 36.0 g/dL   RDW 13.8 11.5 - 15.5 %   Platelets 268 150 - 400 K/uL  I-stat troponin, ED (not at [facility], [place])     Status: Abnormal   Collection Time: 03/17/15  2:38 AM  Result Value Ref Range   Troponin i, poc 0.11 (HH) 0.00 - 0.08 ng/mL   Comment NOTIFIED PHYSICIAN    Comment 3            Dg Chest 2 View  03/15/2015  CLINICAL DATA:  Chest pain and shortness of breath today. Initial encounter. EXAM: CHEST  2 VIEW COMPARISON:  PA and lateral chest 01/22/2012. FINDINGS: The lungs are clear. Heart size is normal. No pneumothorax or pleural effusion. No focal bony abnormality. IMPRESSION: Negative chest. Electronically Signed   By: Inge Rise M.D.   On: 03/15/2015 19:19   Ct Angio Chest Pe W/cm &/or Wo Cm  03/16/2015  CLINICAL DATA:  Chest pain and shortness of breath for 10 days. EXAM: CT ANGIOGRAPHY CHEST WITH CONTRAST TECHNIQUE: Multidetector CT imaging of the chest was performed using the standard protocol during bolus administration of intravenous contrast. Multiplanar CT image reconstructions and MIPs were obtained to evaluate the vascular anatomy. CONTRAST:  153mL OMNIPAQUE IOHEXOL 350 MG/ML SOLN COMPARISON:  Radiographs 5 hours prior. FINDINGS: There are no filling defects within the pulmonary arteries to suggest pulmonary embolus. Thoracic aorta normal in caliber with mild atherosclerosis. Scattered coronary artery calcifications. Heart upper limits of normal in size. No mediastinal or hilar adenopathy. No pleural or pericardial effusion. Heterogeneous diffuse enlargement of the right lobe of the thyroid gland without CT defined nodule. Scattered atelectasis and hypoventilatory change. There is subpleural fat in the left lung base. Evaluation of the upper abdomen demonstrates no acute abnormality. Liver appears enlarged with steatosis. There are no acute or suspicious osseous abnormalities. Review of the MIP images confirms the above findings. IMPRESSION: 1. No pulmonary embolus or acute intrathoracic process. 2. Coronary artery calcifications. 3. Incidental findings of heterogeneous enlargement of the right thyroid gland, no CT defined nodule, suspect goiter. 4. Hepatic steatosis. Electronically Signed   By: Jeb Levering M.D.   On: 03/16/2015 00:35     ASSESSMENT:  1. CP in  the setting of multiple CVD risk factors (male, smoker, HTN, T2DM) - POC TnI 0.11; ECG negative for acute ST-T changes - Stable vitals   PLAN/DISCUSSION:  Admit to cardiology for OBS to rule out.  Cycle Trop ASA 81 mg po qd, statin, beta blocker NTG sl prn  BG monitoring and sliding scale insulin    Plan for exercise perfusion scan once ruled out (if Trop stays flat).  Please see orders for other details.    Wandra Mannan, MD Cardiology

## 2015-03-17 NOTE — Progress Notes (Signed)
CRITICAL VALUE ALERT  Critical value received:  Troponin= 0.13  Date of notification:  03/17/2015  Time of notification:  0420  Critical value read back:yes  Nurse who received alert:  Purcell Nails  MD notified (1st page):  Azeem  Time of first page: 0430  MD notified (2nd page):  Time of second page:  Responding MD:  Susy Manor  Time MD responded: [phone]

## 2015-03-17 NOTE — ED Notes (Signed)
PER EMS: pt from home with c/o exertional tightness to central chest and SOB. He was seen here yesterday for same and told to follow up with cardiology and would need a stress test. He followed up with cardiology today but did not have stress done and told to come back next Friday. Today, pt reported worsening SOB and CP. EMS stated he had labored breathing so EMS adm 1 nitro and then he stated he felt much better. BP-180/110 and then 139/96 after nitro. HR-98 NSR, initial RR 24 and now RR 18. 100% 2[place].

## 2015-03-17 NOTE — ED Notes (Signed)
When this RN was about to take patient to unit upstairs, he requested a nitro tablet. He denied having chest pain but states he wanted the pill because his chest felt sore. This RN explained to patient that a nitro would only be given with chest pain and stated that his chest felt sore and he would like the tablet. Pt rates his soreness 1/10.

## 2015-03-17 NOTE — Progress Notes (Signed)
ANTICOAGULATION CONSULT NOTE - Initial Consult  Pharmacy Consult for lovenox  Indication: chest pain/ACS  Allergies  Allergen Reactions  . Aspirin Other (See Comments)    Upset stomach      Patient Measurements: Height: 5\' 7"  (170.2 cm) Weight: 228 lb (103.42 kg) IBW/kg (Calculated) : 66.1   Vital Signs: Temp: 97.8 F (36.6 C) (02/04 0414) Temp Source: Oral (02/04 0414) BP: 143/73 mmHg (02/04 0414) Pulse Rate: 88 (02/04 0414)  Labs:  Recent Labs  03/15/15 1853 03/17/15 0134 03/17/15 0301 03/17/15 0547 03/17/15 1101  HGB 14.4 15.0  --  14.0  --   HCT 43.7 44.1  --  41.1  --   PLT 287 268  --  252  --   CREATININE 1.57* 1.23  --  1.27*  --   TROPONINI  --   --  0.13* 0.25* 0.63*    Estimated Creatinine Clearance: 76.2 mL/min (by C-G formula based on Cr of 1.27).   Medical History: Past Medical History  Diagnosis Date  . Hypertension   . Diverticulosis   . Hypercholesteremia   . Diabetes mellitus without complication ([place])   . Dyspnea on exertion   . Chest pressure    Assessment: 55 yo AA man with HTN, T2DM, HLD, smoker, was seen in the ER on 03/15/2015 for dyspnea and chest discomfort.   Cardiac enzymes now trending up, new orders received to increase lovenox to ACS dosing. Patient received 40mg  this am, will increase dose to 1mg /kg q12 hours. Will follow up with CBC in am, was normal this am. He is not on any anticoagulants prior to admission.  Goal of Therapy:  Anti-Xa level 0.6-1 units/ml 4hrs after LMWH dose given Monitor platelets by anticoagulation protocol: Yes   Plan:  Lovenox 100mg  sq q12 hours - start now CBC in am then every 72 hours while on lovenox Plan for cath 2/6  Erin Hearing PharmD., BCPS Clinical Pharmacist Pager [phone] 03/17/2015 12:50 PM

## 2015-03-17 NOTE — Progress Notes (Signed)
Phone call from [place], [place] - stated Stress Test canceled, stated he would update orders.

## 2015-03-18 ENCOUNTER — Inpatient Hospital Stay (HOSPITAL_COMMUNITY): Payer: BLUE CROSS/BLUE SHIELD

## 2015-03-18 DIAGNOSIS — R079 Chest pain, unspecified: Secondary | ICD-10-CM

## 2015-03-18 DIAGNOSIS — E782 Mixed hyperlipidemia: Secondary | ICD-10-CM

## 2015-03-18 DIAGNOSIS — I10 Essential (primary) hypertension: Secondary | ICD-10-CM

## 2015-03-18 LAB — GLUCOSE, CAPILLARY
GLUCOSE-CAPILLARY: 259 mg/dL — AB (ref 65–99)
GLUCOSE-CAPILLARY: 256 mg/dL — AB (ref 65–99)
Glucose-Capillary: 259 mg/dL — ABNORMAL HIGH (ref 65–99)
Glucose-Capillary: 288 mg/dL — ABNORMAL HIGH (ref 65–99)

## 2015-03-18 LAB — CBC
HEMATOCRIT: 42.9 % (ref 39.0–52.0)
HEMOGLOBIN: 14.3 g/dL (ref 13.0–17.0)
MCH: 28.1 pg (ref 26.0–34.0)
MCHC: 33.3 g/dL (ref 30.0–36.0)
MCV: 84.4 fL (ref 78.0–100.0)
Platelets: 243 10*3/uL (ref 150–400)
RBC: 5.08 MIL/uL (ref 4.22–5.81)
RDW: 13.8 % (ref 11.5–15.5)
WBC: 9.7 10*3/uL (ref 4.0–10.5)

## 2015-03-18 LAB — LIPID PANEL
CHOLESTEROL: 227 mg/dL — AB (ref 0–200)
HDL: 33 mg/dL — ABNORMAL LOW (ref >40)
LDL Cholesterol: UNDETERMINED mg/dL (ref 0–99)
TRIGLYCERIDES: 450 mg/dL — AB (ref <150)
Total CHOL/HDL Ratio: 6.9 RATIO
VLDL: UNDETERMINED mg/dL (ref 0–40)

## 2015-03-18 LAB — TROPONIN I: TROPONIN I: 1.05 ng/mL — AB (ref <0.031)

## 2015-03-18 MED ORDER — SODIUM CHLORIDE 0.9 % WEIGHT BASED INFUSION: 3.0000 mL/kg/h | INTRAVENOUS | Status: DC

## 2015-03-18 MED ORDER — SODIUM CHLORIDE 0.9 % WEIGHT BASED INFUSION
1.0000 mL/kg/h | INTRAVENOUS | Status: DC
Start: 2015-03-18 — End: 2015-03-19

## 2015-03-18 MED ORDER — SODIUM CHLORIDE 0.9 % WEIGHT BASED INFUSION: 1.0000 mL/kg/h | INTRAVENOUS | Status: DC

## 2015-03-18 NOTE — Progress Notes (Signed)
Primary cardiologist: Dr. Quay Burow  Seen for followup: Chest pain and abnormal troponin I  Subjective:    No recurrent chest pain. No breathlessness or palpitations. Stable appetite.  Objective:   Temp:  [98 F (36.7 C)-98.1 F (36.7 C)] 98.1 F (36.7 C) (02/05 0524) Pulse Rate:  [84-93] 89 (02/05 0524) Resp:  [18] 18 (02/05 0524) BP: (107-116)/(64-81) 113/72 mmHg (02/05 0524) SpO2:  [97 %] 97 % (02/05 0524) Last BM Date: 03/17/15  Filed Weights   03/17/15 0414  Weight: 228 lb (103.42 kg)    Intake/Output Summary (Last 24 hours) at 03/18/15 0938 Last data filed at 03/18/15 0910  Gross per 24 hour  Intake    840 ml  Output      0 ml  Net    840 ml    Telemetry: Sinus rhythm.  Exam:  General: Obese male, appears comfortable at rest.   Lungs: Clear, nonlabored.  Cardiac: RRR without gallop.  Extremities: No pitting edema.  Lab Results:  Basic Metabolic Panel:  Recent Labs Lab 03/15/15 1853 03/17/15 0134 03/17/15 0547  NA 140 134*  --   K 4.8 4.7  --   CL 97* 94*  --   CO2 28 25  --   GLUCOSE 358* 318*  --   BUN 21* 20  --   CREATININE 1.57* 1.23 1.27*  CALCIUM 10.6* 9.9  --   MG  --   --  1.8    CBC:  Recent Labs Lab 03/17/15 0134 03/17/15 0547 03/18/15 0012  WBC 13.6* 11.7* 9.7  HGB 15.0 14.0 14.3  HCT 44.1 41.1 42.9  MCV 85.0 83.9 84.4  PLT 268 252 243    Cardiac Enzymes:  Recent Labs Lab 03/17/15 1249 03/17/15 1825 03/18/15 0012  TROPONINI 0.78* 0.88* 1.05*    Chest CT angiogram 03/15/2015: FINDINGS: There are no filling defects within the pulmonary arteries to suggest pulmonary embolus.  Thoracic aorta normal in caliber with mild atherosclerosis. Scattered coronary artery calcifications. Heart upper limits of normal in size. No mediastinal or hilar adenopathy. No pleural or pericardial effusion.  Heterogeneous diffuse enlargement of the right lobe of the thyroid gland without CT defined  nodule.  Scattered atelectasis and hypoventilatory change. There is subpleural fat in the left lung base.  Evaluation of the upper abdomen demonstrates no acute abnormality. Liver appears enlarged with steatosis.  There are no acute or suspicious osseous abnormalities.  Review of the MIP images confirms the above findings.  IMPRESSION: 1. No pulmonary embolus or acute intrathoracic process. 2. Coronary artery calcifications. 3. Incidental findings of heterogeneous enlargement of the right thyroid gland, no CT defined nodule, suspect goiter. 4. Hepatic steatosis.   Medications:   Scheduled Medications: . aspirin  81 mg Oral Pre-Cath  . aspirin EC  81 mg Oral Daily  . atorvastatin  80 mg Oral q1800  . carvedilol  3.125 mg Oral BID WC  . enoxaparin (LOVENOX) injection  100 mg Subcutaneous BID  . insulin aspart  0-9 Units Subcutaneous TID WC  . lisinopril  10 mg Oral Daily  . sodium chloride flush  3 mL Intravenous Q12H  . sodium chloride flush  3 mL Intravenous Q12H    PRN Medications: sodium chloride, sodium chloride, acetaminophen, nitroGLYCERIN, ondansetron (ZOFRAN) IV, sodium chloride flush, sodium chloride flush   Assessment:   1. NSTEMI, troponin I up to 1.05. ECG is abnormal but similar to previous tracings. Recent chest CTA did not show pulmonary embolus. He did have  coronary artery calcifications by that study. No recurrent chest pain at this time on medical therapy including treatment dose Lovenox.  2. Essential hypertension. Patient on Norvasc and lisinopril as an outpatient. Blood pressure stable.  3. Hyperlipidemia, on Pravachol. Total cholesterol 227 with elevated triglycerides.  4. Type 2 diabetes mellitus. Hemoglobin A1c pending.   Plan/Discussion:    Scheduled for cardiac catheterization on Monday. He is in agreement to proceed. Echocardiogram pending. Current medications include aspirin, Coreg, lisinopril, and Lovenox. He was switched from  Pravachol to high-dose Lipitor. Will stop Lovenox after evening dose.  Satira Sark, M.D., F.A.C.C.

## 2015-03-18 NOTE — Progress Notes (Signed)
  Echocardiogram 2D Echocardiogram has been performed.  [name] 03/18/2015, 3:55 PM

## 2015-03-18 NOTE — Progress Notes (Signed)
Tx paged B. Bhagat, [place] 0950; informed multiple duplicate cardiac cath orders including many dated 24 hours early [HS of 03/17/15 & AM of 03/18/15]. Cardiac cath is ordered for Monday, 03/19/15 and orders should read HS of 03/18/15 & AM of 03/19/15. Have informed pharmacy - this RN requested pharmacy to re-time NS IV infusion. Have informed nursing staff last evening and today - applied same information on sticky note to communicate the same.

## 2015-03-19 ENCOUNTER — Encounter (HOSPITAL_COMMUNITY)
Admission: EM | Disposition: A | Discharge status: Home / Self Care | Payer: Self-pay | Source: Home / Self Care | Attending: Cardiovascular Disease

## 2015-03-19 ENCOUNTER — Other Ambulatory Visit: Payer: Self-pay | Admitting: *Deleted

## 2015-03-19 ENCOUNTER — Encounter (HOSPITAL_COMMUNITY): Payer: Self-pay | Admitting: Cardiology

## 2015-03-19 DIAGNOSIS — I2510 Atherosclerotic heart disease of native coronary artery without angina pectoris: Secondary | ICD-10-CM

## 2015-03-19 HISTORY — PX: CARDIAC CATHETERIZATION: SHX172

## 2015-03-19 LAB — BASIC METABOLIC PANEL
Anion gap: 10 (ref 5–15)
Anion gap: 12 (ref 5–15)
BUN: 22 mg/dL — ABNORMAL HIGH (ref 6–20)
BUN: 25 mg/dL — ABNORMAL HIGH (ref 6–20)
CALCIUM: 9.4 mg/dL (ref 8.9–10.3)
CALCIUM: 10.1 mg/dL (ref 8.9–10.3)
CO2: 28 mmol/L (ref 22–32)
CO2: 27 mmol/L (ref 22–32)
CREATININE: 1.35 mg/dL — AB (ref 0.61–1.24)
CREATININE: 1.63 mg/dL — AB (ref 0.61–1.24)
Chloride: 101 mmol/L (ref 101–111)
Chloride: 97 mmol/L — ABNORMAL LOW (ref 101–111)
GFR calc non Af Amer: 58 mL/min — ABNORMAL LOW (ref >60)
GFR, EST AFRICAN AMERICAN: 54 mL/min — AB (ref >60)
GFR, EST NON AFRICAN AMERICAN: 46 mL/min — AB (ref >60)
GLUCOSE: 254 mg/dL — AB (ref 65–99)
Glucose, Bld: 319 mg/dL — ABNORMAL HIGH (ref 65–99)
Potassium: 4.2 mmol/L (ref 3.5–5.1)
Potassium: 4.2 mmol/L (ref 3.5–5.1)
SODIUM: 136 mmol/L (ref 135–145)
Sodium: 139 mmol/L (ref 135–145)

## 2015-03-19 LAB — CBC
HCT: 43.9 % (ref 39.0–52.0)
Hemoglobin: 14.8 g/dL (ref 13.0–17.0)
MCH: 28.7 pg (ref 26.0–34.0)
MCHC: 33.7 g/dL (ref 30.0–36.0)
MCV: 85.2 fL (ref 78.0–100.0)
PLATELETS: 238 10*3/uL (ref 150–400)
RBC: 5.15 MIL/uL (ref 4.22–5.81)
RDW: 13.8 % (ref 11.5–15.5)
WBC: 11.3 10*3/uL — ABNORMAL HIGH (ref 4.0–10.5)

## 2015-03-19 LAB — GLUCOSE, CAPILLARY
GLUCOSE-CAPILLARY: 254 mg/dL — AB (ref 65–99)
GLUCOSE-CAPILLARY: 180 mg/dL — AB (ref 65–99)
GLUCOSE-CAPILLARY: 149 mg/dL — AB (ref 65–99)
GLUCOSE-CAPILLARY: 197 mg/dL — AB (ref 65–99)

## 2015-03-19 LAB — HEMOGLOBIN A1C
Hgb A1c MFr Bld: 13.4 % — ABNORMAL HIGH (ref 4.8–5.6)
Mean Plasma Glucose: 338 mg/dL

## 2015-03-19 LAB — PROTIME-INR
INR: 1.05 (ref 0.00–1.49)
PROTHROMBIN TIME: 13.9 s (ref 11.6–15.2)

## 2015-03-19 SURGERY — LEFT HEART CATH AND CORONARY ANGIOGRAPHY
Anesthesia: LOCAL

## 2015-03-19 MED ORDER — IOHEXOL 350 MG/ML SOLN
INTRAVENOUS | Status: DC | PRN
  Administered 2015-03-19: 97 mL via INTRA_ARTERIAL

## 2015-03-19 MED ORDER — HEPARIN (PORCINE) IN NACL 2-0.9 UNIT/ML-% IJ SOLN
INTRAMUSCULAR | Status: AC
  Filled 2015-03-19: qty 1500

## 2015-03-19 MED ORDER — HEPARIN (PORCINE) IN NACL 2-0.9 UNIT/ML-% IJ SOLN
INTRAMUSCULAR | Status: DC | PRN
  Administered 2015-03-19: 1500 mL

## 2015-03-19 MED ORDER — SODIUM CHLORIDE 0.9 % IV SOLN: 250.0000 mL | INTRAVENOUS | Status: DC | PRN

## 2015-03-19 MED ORDER — LIDOCAINE HCL (PF) 1 % IJ SOLN
INTRAMUSCULAR | Status: DC | PRN
  Administered 2015-03-19: 5 mL

## 2015-03-19 MED ORDER — SODIUM CHLORIDE 0.9 % WEIGHT BASED INFUSION
1.0000 mL/kg/h | INTRAVENOUS | Status: DC
  Administered 2015-03-19: 1 mL/kg/h via INTRAVENOUS

## 2015-03-19 MED ORDER — SODIUM CHLORIDE 0.9 % WEIGHT BASED INFUSION
3.0000 mL/kg/h | INTRAVENOUS | Status: DC
  Administered 2015-03-19: 3 mL/kg/h via INTRAVENOUS

## 2015-03-19 MED ORDER — ASPIRIN 81 MG PO CHEW: 81.0000 mg | CHEWABLE_TABLET | Freq: Every day | ORAL | Status: DC

## 2015-03-19 MED ORDER — HEPARIN SODIUM (PORCINE) 1000 UNIT/ML IJ SOLN
INTRAMUSCULAR | Status: AC
  Filled 2015-03-19: qty 1

## 2015-03-19 MED ORDER — SODIUM CHLORIDE 0.9% FLUSH: 3.0000 mL | INTRAVENOUS | Status: DC | PRN

## 2015-03-19 MED ORDER — ONDANSETRON HCL 4 MG/2ML IJ SOLN
4.0000 mg | Freq: Four times a day (QID) | INTRAMUSCULAR | Status: DC | PRN
  Filled 2015-03-19: qty 2

## 2015-03-19 MED ORDER — ACETAMINOPHEN 325 MG PO TABS: 650.0000 mg | ORAL_TABLET | ORAL | Status: DC | PRN

## 2015-03-19 MED ORDER — SODIUM CHLORIDE 0.9% FLUSH
3.0000 mL | Freq: Two times a day (BID) | INTRAVENOUS | Status: DC
  Administered 2015-03-20: 3 mL via INTRAVENOUS

## 2015-03-19 MED ORDER — VERAPAMIL HCL 2.5 MG/ML IV SOLN
INTRA_ARTERIAL | Status: DC | PRN
  Administered 2015-03-19: 16:00:00 via INTRA_ARTERIAL

## 2015-03-19 MED ORDER — LIDOCAINE HCL (PF) 1 % IJ SOLN
INTRAMUSCULAR | Status: AC
  Filled 2015-03-19: qty 30

## 2015-03-19 MED ORDER — MORPHINE SULFATE (PF) 2 MG/ML IV SOLN
2.0000 mg | INTRAVENOUS | Status: DC | PRN
  Filled 2015-03-19 (×2): qty 1

## 2015-03-19 MED ORDER — HEPARIN SODIUM (PORCINE) 1000 UNIT/ML IJ SOLN
INTRAMUSCULAR | Status: DC | PRN
  Administered 2015-03-19: 5000 [IU] via INTRAVENOUS

## 2015-03-19 MED ORDER — NITROGLYCERIN 1 MG/10 ML FOR IR/CATH LAB
INTRA_ARTERIAL | Status: AC
  Filled 2015-03-19: qty 10

## 2015-03-19 MED ORDER — NITROGLYCERIN IN D5W 200-5 MCG/ML-% IV SOLN
INTRAVENOUS | Status: DC | PRN
  Administered 2015-03-19: 10 ug/min via INTRAVENOUS

## 2015-03-19 MED ORDER — VERAPAMIL HCL 2.5 MG/ML IV SOLN
INTRAVENOUS | Status: AC
  Filled 2015-03-19: qty 2

## 2015-03-19 SURGICAL SUPPLY — 12 items
CATH INFINITI 5 FR JL3.5 (CATHETERS) ×2 IMPLANT
CATH INFINITI 5FR ANG PIGTAIL (CATHETERS) ×2 IMPLANT
CATH INFINITI JR4 5F (CATHETERS) ×2 IMPLANT
DEVICE RAD COMP TR BAND LRG (VASCULAR PRODUCTS) ×2 IMPLANT
GLIDESHEATH SLEND A-KIT 6F 22G (SHEATH) ×2 IMPLANT
KIT HEART LEFT (KITS) ×2 IMPLANT
PACK CARDIAC CATHETERIZATION (CUSTOM PROCEDURE TRAY) ×2 IMPLANT
SYR MEDRAD MARK V 150ML (SYRINGE) ×2 IMPLANT
TRANSDUCER W/STOPCOCK (MISCELLANEOUS) ×2 IMPLANT
TUBING CIL FLEX 10 FLL-RA (TUBING) ×2 IMPLANT
WIRE HI TORQ VERSACORE-J 145CM (WIRE) ×2 IMPLANT
WIRE SAFE-T 1.5MM-J .035X260CM (WIRE) ×2 IMPLANT

## 2015-03-19 NOTE — H&P (View-Only) (Signed)
Primary cardiologist: Dr. Quay Burow  Seen for followup: Chest pain and abnormal troponin I  Subjective:    No CP or dyspnea  Objective:   Temp:  [98 F (36.7 C)-98.8 F (37.1 C)] 98 F (36.7 C) (02/06 0502) Pulse Rate:  [84-88] 84 (02/06 0502) Resp:  [18] 18 (02/06 0502) BP: (103-117)/(62-73) 103/62 mmHg (02/06 0502) SpO2:  [95 %-96 %] 96 % (02/06 0502) Weight:  [223 lb 8 oz (101.379 kg)] 223 lb 8 oz (101.379 kg) (02/06 0502) Last BM Date: 03/18/15  Filed Weights   03/17/15 0414 03/19/15 0502  Weight: 228 lb (103.42 kg) 223 lb 8 oz (101.379 kg)    Intake/Output Summary (Last 24 hours) at 03/19/15 0943 Last data filed at 03/19/15 0703  Gross per 24 hour  Intake   1440 ml  Output      0 ml  Net   1440 ml    Telemetry: Sinus rhythm. Nonconducted pacs  Exam:  General: WD WN NAD  HEENT: normal  Neck: supple   Lungs: CTA  Cardiac: RRR   Abd: soft, NT/ND  Extremities: No edema.  Neuro: grossly intact  Lab Results:  Basic Metabolic Panel:  Recent Labs Lab 03/17/15 0134 03/17/15 0547 03/18/15 2350 03/19/15 0819  NA 134*  --  136 139  K 4.7  --  4.2 4.2  CL 94*  --  97* 101  CO2 25  --  27 28  GLUCOSE 318*  --  319* 254*  BUN 20  --  25* 22*  CREATININE 1.23 1.27* 1.63* 1.35*  CALCIUM 9.9  --  10.1 9.4  MG  --  1.8  --   --     CBC:  Recent Labs Lab 03/17/15 0547 03/18/15 0012 03/18/15 2350  WBC 11.7* 9.7 11.3*  HGB 14.0 14.3 14.8  HCT 41.1 42.9 43.9  MCV 83.9 84.4 85.2  PLT 252 243 238    Cardiac Enzymes:  Recent Labs Lab 03/17/15 1249 03/17/15 1825 03/18/15 0012  TROPONINI 0.78* 0.88* 1.05*    Chest CT angiogram 03/15/2015: FINDINGS: There are no filling defects within the pulmonary arteries to suggest pulmonary embolus.  Thoracic aorta normal in caliber with mild atherosclerosis. Scattered coronary artery calcifications. Heart upper limits of normal in size. No mediastinal or hilar adenopathy. No pleural  or pericardial effusion.  Heterogeneous diffuse enlargement of the right lobe of the thyroid gland without CT defined nodule.  Scattered atelectasis and hypoventilatory change. There is subpleural fat in the left lung base.  Evaluation of the upper abdomen demonstrates no acute abnormality. Liver appears enlarged with steatosis.  There are no acute or suspicious osseous abnormalities.  Review of the MIP images confirms the above findings.  IMPRESSION: 1. No pulmonary embolus or acute intrathoracic process. 2. Coronary artery calcifications. 3. Incidental findings of heterogeneous enlargement of the right thyroid gland, no CT defined nodule, suspect goiter. 4. Hepatic steatosis.   Medications:   Scheduled Medications: . aspirin EC  81 mg Oral Daily  . atorvastatin  80 mg Oral q1800  . carvedilol  3.125 mg Oral BID WC  . enoxaparin (LOVENOX) injection  100 mg Subcutaneous BID  . insulin aspart  0-9 Units Subcutaneous TID WC  . lisinopril  10 mg Oral Daily  . sodium chloride flush  3 mL Intravenous Q12H  . sodium chloride flush  3 mL Intravenous Q12H    PRN Medications: sodium chloride, sodium chloride, acetaminophen, nitroGLYCERIN, ondansetron (ZOFRAN) IV, sodium chloride flush, sodium chloride  flush   Assessment:   1. NSTEMI, troponin I up to 1.05. Recent chest CTA did not show pulmonary embolus. He did have coronary artery calcifications by that study. No recurrent chest pain at this time on medical therapy including treatment dose Lovenox.  2. Essential hypertension. Continue present meds.  3. Hyperlipidemia. Continue statin  4. Type 2 diabetes mellitus.   5. Stage 3 renal insufficiency   Plan/Discussion:    Scheduled for cardiac catheterization today. Risks and benefits discussed including renal insuff discussed and pt agrees to proceed. Hydrate precath. Recheck Cr in AM. Continue present meds.  Kirk Ruths

## 2015-03-19 NOTE — Progress Notes (Signed)
Primary cardiologist: Dr. Quay Burow  Seen for followup: Chest pain and abnormal troponin I  Subjective:    No CP or dyspnea  Objective:   Temp:  [98 F (36.7 C)-98.8 F (37.1 C)] 98 F (36.7 C) (02/06 0502) Pulse Rate:  [84-88] 84 (02/06 0502) Resp:  [18] 18 (02/06 0502) BP: (103-117)/(62-73) 103/62 mmHg (02/06 0502) SpO2:  [95 %-96 %] 96 % (02/06 0502) Weight:  [223 lb 8 oz (101.379 kg)] 223 lb 8 oz (101.379 kg) (02/06 0502) Last BM Date: 03/18/15  Filed Weights   03/17/15 0414 03/19/15 0502  Weight: 228 lb (103.42 kg) 223 lb 8 oz (101.379 kg)    Intake/Output Summary (Last 24 hours) at 03/19/15 0943 Last data filed at 03/19/15 0703  Gross per 24 hour  Intake   1440 ml  Output      0 ml  Net   1440 ml    Telemetry: Sinus rhythm. Nonconducted pacs  Exam:  General: WD WN NAD  HEENT: normal  Neck: supple   Lungs: CTA  Cardiac: RRR   Abd: soft, NT/ND  Extremities: No edema.  Neuro: grossly intact  Lab Results:  Basic Metabolic Panel:  Recent Labs Lab 03/17/15 0134 03/17/15 0547 03/18/15 2350 03/19/15 0819  NA 134*  --  136 139  K 4.7  --  4.2 4.2  CL 94*  --  97* 101  CO2 25  --  27 28  GLUCOSE 318*  --  319* 254*  BUN 20  --  25* 22*  CREATININE 1.23 1.27* 1.63* 1.35*  CALCIUM 9.9  --  10.1 9.4  MG  --  1.8  --   --     CBC:  Recent Labs Lab 03/17/15 0547 03/18/15 0012 03/18/15 2350  WBC 11.7* 9.7 11.3*  HGB 14.0 14.3 14.8  HCT 41.1 42.9 43.9  MCV 83.9 84.4 85.2  PLT 252 243 238    Cardiac Enzymes:  Recent Labs Lab 03/17/15 1249 03/17/15 1825 03/18/15 0012  TROPONINI 0.78* 0.88* 1.05*    Chest CT angiogram 03/15/2015: FINDINGS: There are no filling defects within the pulmonary arteries to suggest pulmonary embolus.  Thoracic aorta normal in caliber with mild atherosclerosis. Scattered coronary artery calcifications. Heart upper limits of normal in size. No mediastinal or hilar adenopathy. No pleural  or pericardial effusion.  Heterogeneous diffuse enlargement of the right lobe of the thyroid gland without CT defined nodule.  Scattered atelectasis and hypoventilatory change. There is subpleural fat in the left lung base.  Evaluation of the upper abdomen demonstrates no acute abnormality. Liver appears enlarged with steatosis.  There are no acute or suspicious osseous abnormalities.  Review of the MIP images confirms the above findings.  IMPRESSION: 1. No pulmonary embolus or acute intrathoracic process. 2. Coronary artery calcifications. 3. Incidental findings of heterogeneous enlargement of the right thyroid gland, no CT defined nodule, suspect goiter. 4. Hepatic steatosis.   Medications:   Scheduled Medications: . aspirin EC  81 mg Oral Daily  . atorvastatin  80 mg Oral q1800  . carvedilol  3.125 mg Oral BID WC  . enoxaparin (LOVENOX) injection  100 mg Subcutaneous BID  . insulin aspart  0-9 Units Subcutaneous TID WC  . lisinopril  10 mg Oral Daily  . sodium chloride flush  3 mL Intravenous Q12H  . sodium chloride flush  3 mL Intravenous Q12H    PRN Medications: sodium chloride, sodium chloride, acetaminophen, nitroGLYCERIN, ondansetron (ZOFRAN) IV, sodium chloride flush, sodium chloride  flush   Assessment:   1. NSTEMI, troponin I up to 1.05. Recent chest CTA did not show pulmonary embolus. He did have coronary artery calcifications by that study. No recurrent chest pain at this time on medical therapy including treatment dose Lovenox.  2. Essential hypertension. Continue present meds.  3. Hyperlipidemia. Continue statin  4. Type 2 diabetes mellitus.   5. Stage 3 renal insufficiency   Plan/Discussion:    Scheduled for cardiac catheterization today. Risks and benefits discussed including renal insuff discussed and pt agrees to proceed. Hydrate precath. Recheck Cr in AM. Continue present meds.  [name]

## 2015-03-19 NOTE — Interval H&P Note (Signed)
Cath Lab Visit (complete for each Cath Lab visit)  Clinical Evaluation Leading to the Procedure:   ACS: Yes.    Non-ACS:    Anginal Classification: CCS III  Anti-ischemic medical therapy: Minimal Therapy (1 class of medications)  Non-Invasive Test Results: No non-invasive testing performed  Prior CABG: No previous CABG      History and Physical Interval Note:  03/19/2015 3:36 PM  [name]  has presented today for surgery, with the diagnosis of NSTEMI  The various methods of treatment have been discussed with the patient and family. After consideration of risks, benefits and other options for treatment, the patient has consented to  Procedure(s): Left Heart Cath and Coronary Angiography (N/A) as a surgical intervention .  The patient's history has been reviewed, patient examined, no change in status, stable for surgery.  I have reviewed the patient's chart and labs.  Questions were answered to the patient's satisfaction.     Quay Burow

## 2015-03-19 NOTE — Progress Notes (Signed)
Cath lab call to inform that they will pick up the pt in a hour, but Pt ask to have more time to wash up  before we start the I V F and administer his  ASA.

## 2015-03-19 NOTE — Progress Notes (Signed)
Pt refuses the AM EKG.

## 2015-03-20 ENCOUNTER — Inpatient Hospital Stay (HOSPITAL_COMMUNITY): Payer: BLUE CROSS/BLUE SHIELD

## 2015-03-20 ENCOUNTER — Encounter (HOSPITAL_COMMUNITY)
Admission: EM | Disposition: A | Discharge status: Home / Self Care | Payer: Self-pay | Source: Home / Self Care | Attending: Cardiovascular Disease

## 2015-03-20 ENCOUNTER — Encounter (HOSPITAL_COMMUNITY): Payer: BLUE CROSS/BLUE SHIELD

## 2015-03-20 ENCOUNTER — Encounter (HOSPITAL_COMMUNITY): Payer: Self-pay | Admitting: Cardiovascular Disease

## 2015-03-20 HISTORY — PX: CARDIAC CATHETERIZATION: SHX172

## 2015-03-20 LAB — CBC
HEMATOCRIT: 40 % (ref 39.0–52.0)
Hemoglobin: 12.7 g/dL — ABNORMAL LOW (ref 13.0–17.0)
MCH: 27.1 pg (ref 26.0–34.0)
MCHC: 31.8 g/dL (ref 30.0–36.0)
MCV: 85.5 fL (ref 78.0–100.0)
PLATELETS: 225 10*3/uL (ref 150–400)
RBC: 4.68 MIL/uL (ref 4.22–5.81)
RDW: 14 % (ref 11.5–15.5)
WBC: 8.9 10*3/uL (ref 4.0–10.5)

## 2015-03-20 LAB — GLUCOSE, CAPILLARY
GLUCOSE-CAPILLARY: 195 mg/dL — AB (ref 65–99)
Glucose-Capillary: 184 mg/dL — ABNORMAL HIGH (ref 65–99)
Glucose-Capillary: 222 mg/dL — ABNORMAL HIGH (ref 65–99)
Glucose-Capillary: 177 mg/dL — ABNORMAL HIGH (ref 65–99)
Glucose-Capillary: 210 mg/dL — ABNORMAL HIGH (ref 65–99)

## 2015-03-20 LAB — BASIC METABOLIC PANEL
ANION GAP: 10 (ref 5–15)
BUN: 16 mg/dL (ref 6–20)
CALCIUM: 9.1 mg/dL (ref 8.9–10.3)
CO2: 26 mmol/L (ref 22–32)
CREATININE: 1.28 mg/dL — AB (ref 0.61–1.24)
Chloride: 102 mmol/L (ref 101–111)
GLUCOSE: 189 mg/dL — AB (ref 65–99)
Potassium: 4 mmol/L (ref 3.5–5.1)
Sodium: 138 mmol/L (ref 135–145)

## 2015-03-20 LAB — TROPONIN I
TROPONIN I: 0.39 ng/mL — AB (ref <0.031)
Troponin I: 1.66 ng/mL (ref <0.031)

## 2015-03-20 LAB — MRSA PCR SCREENING: MRSA by PCR: NEGATIVE

## 2015-03-20 LAB — POCT ACTIVATED CLOTTING TIME: ACTIVATED CLOTTING TIME: 358 s

## 2015-03-20 SURGERY — CORONARY STENT INTERVENTION

## 2015-03-20 MED ORDER — ONDANSETRON HCL 4 MG/2ML IJ SOLN: 4.0000 mg | Freq: Four times a day (QID) | INTRAMUSCULAR | Status: DC | PRN

## 2015-03-20 MED ORDER — IOHEXOL 350 MG/ML SOLN
INTRAVENOUS | Status: DC | PRN
  Administered 2015-03-20: 215 mL via INTRA_ARTERIAL

## 2015-03-20 MED ORDER — INSULIN ASPART 100 UNIT/ML ~~LOC~~ SOLN
0.0000 [IU] | Freq: Every day | SUBCUTANEOUS | Status: DC
  Administered 2015-03-20: 2 [IU] via SUBCUTANEOUS

## 2015-03-20 MED ORDER — BIVALIRUDIN 250 MG IV SOLR
INTRAVENOUS | Status: AC
  Filled 2015-03-20: qty 250

## 2015-03-20 MED ORDER — INSULIN ASPART 100 UNIT/ML ~~LOC~~ SOLN
0.0000 [IU] | Freq: Three times a day (TID) | SUBCUTANEOUS | Status: DC
  Administered 2015-03-21 (×2): 2 [IU] via SUBCUTANEOUS
  Administered 2015-03-21 – 2015-03-22 (×2): 3 [IU] via SUBCUTANEOUS
  Administered 2015-03-23: 07:00:00 2 [IU] via SUBCUTANEOUS

## 2015-03-20 MED ORDER — ADENOSINE (DIAGNOSTIC) 140MCG/KG/MIN
INTRAVENOUS | Status: DC | PRN
  Administered 2015-03-20: 140 ug/kg/min via INTRAVENOUS

## 2015-03-20 MED ORDER — SODIUM CHLORIDE 0.9 % IV SOLN: 250.0000 mL | INTRAVENOUS | Status: DC | PRN

## 2015-03-20 MED ORDER — TICAGRELOR 90 MG PO TABS
90.0000 mg | ORAL_TABLET | Freq: Two times a day (BID) | ORAL | Status: DC
  Administered 2015-03-20 – 2015-03-23 (×6): 90 mg via ORAL
  Filled 2015-03-20 (×6): qty 1

## 2015-03-20 MED ORDER — LIDOCAINE HCL (PF) 1 % IJ SOLN
INTRAMUSCULAR | Status: DC | PRN
  Administered 2015-03-20: 15 mL

## 2015-03-20 MED ORDER — CARVEDILOL 3.125 MG PO TABS
9.3750 mg | ORAL_TABLET | Freq: Two times a day (BID) | ORAL | Status: DC
  Administered 2015-03-20 – 2015-03-23 (×6): 9.375 mg via ORAL
  Filled 2015-03-20 (×2): qty 1
  Filled 2015-03-20: qty 3
  Filled 2015-03-20 (×2): qty 1
  Filled 2015-03-20: qty 3

## 2015-03-20 MED ORDER — ATROPINE SULFATE 0.1 MG/ML IJ SOLN
INTRAMUSCULAR | Status: AC
  Filled 2015-03-20: qty 10

## 2015-03-20 MED ORDER — SODIUM CHLORIDE 0.9 % IV SOLN
250.0000 mg | INTRAVENOUS | Status: DC | PRN
  Administered 2015-03-20: 250 mg
  Administered 2015-03-20: 1.75 mg/kg/h via INTRAVENOUS

## 2015-03-20 MED ORDER — NITROGLYCERIN 1 MG/10 ML FOR IR/CATH LAB
INTRA_ARTERIAL | Status: AC
  Filled 2015-03-20: qty 10

## 2015-03-20 MED ORDER — HEPARIN (PORCINE) IN NACL 2-0.9 UNIT/ML-% IJ SOLN
INTRAMUSCULAR | Status: AC
  Filled 2015-03-20: qty 1000

## 2015-03-20 MED ORDER — TICAGRELOR 90 MG PO TABS
180.0000 mg | ORAL_TABLET | Freq: Once | ORAL | Status: AC
  Administered 2015-03-20: 11:00:00 180 mg via ORAL
  Filled 2015-03-20: qty 2

## 2015-03-20 MED ORDER — HYDRALAZINE HCL 20 MG/ML IJ SOLN
10.0000 mg | INTRAMUSCULAR | Status: DC | PRN
  Administered 2015-03-22: 10 mg via INTRAVENOUS

## 2015-03-20 MED ORDER — MIDAZOLAM HCL 2 MG/2ML IJ SOLN
INTRAMUSCULAR | Status: AC
  Filled 2015-03-20: qty 2

## 2015-03-20 MED ORDER — SODIUM CHLORIDE 0.9% FLUSH: 3.0000 mL | Freq: Two times a day (BID) | INTRAVENOUS | Status: DC

## 2015-03-20 MED ORDER — ASPIRIN 81 MG PO CHEW: 81.0000 mg | CHEWABLE_TABLET | Freq: Every day | ORAL | Status: DC

## 2015-03-20 MED ORDER — SODIUM CHLORIDE 0.9% FLUSH: 3.0000 mL | INTRAVENOUS | Status: DC | PRN

## 2015-03-20 MED ORDER — NITROGLYCERIN IN D5W 200-5 MCG/ML-% IV SOLN
5.0000 ug/min | INTRAVENOUS | Status: DC
  Administered 2015-03-21 (×2): 100 ug/min via INTRAVENOUS
  Administered 2015-03-21: 70 ug/min via INTRAVENOUS
  Administered 2015-03-22: 100 ug/min via INTRAVENOUS
  Filled 2015-03-20 (×4): qty 250

## 2015-03-20 MED ORDER — SODIUM CHLORIDE 0.9 % IV SOLN
INTRAVENOUS | Status: DC
  Administered 2015-03-20: 10:00:00 via INTRAVENOUS

## 2015-03-20 MED ORDER — MORPHINE SULFATE (PF) 2 MG/ML IV SOLN
2.0000 mg | INTRAVENOUS | Status: DC | PRN
  Administered 2015-03-21 (×2): 2 mg via INTRAVENOUS

## 2015-03-20 MED ORDER — HYDRALAZINE HCL 20 MG/ML IJ SOLN
INTRAMUSCULAR | Status: DC | PRN
  Administered 2015-03-20: 10 mg via INTRAVENOUS

## 2015-03-20 MED ORDER — ADENOSINE 12 MG/4ML IV SOLN
16.0000 mL | Freq: Once | INTRAVENOUS | Status: DC
  Filled 2015-03-20 (×2): qty 16

## 2015-03-20 MED ORDER — SODIUM CHLORIDE 0.9 % IV SOLN
INTRAVENOUS | Status: AC
  Administered 2015-03-20: 18:00:00 via INTRAVENOUS

## 2015-03-20 MED ORDER — FENTANYL CITRATE (PF) 100 MCG/2ML IJ SOLN
INTRAMUSCULAR | Status: DC | PRN
  Administered 2015-03-20 (×2): 25 ug via INTRAVENOUS

## 2015-03-20 MED ORDER — MORPHINE SULFATE (PF) 10 MG/ML IV SOLN
INTRAVENOUS | Status: AC
  Filled 2015-03-20: qty 1

## 2015-03-20 MED ORDER — MORPHINE SULFATE (PF) 10 MG/ML IV SOLN
INTRAVENOUS | Status: DC | PRN
  Administered 2015-03-20 (×3): 2 mg via INTRAVENOUS

## 2015-03-20 MED ORDER — HEPARIN (PORCINE) IN NACL 2-0.9 UNIT/ML-% IJ SOLN
INTRAMUSCULAR | Status: DC | PRN
  Administered 2015-03-20: 1000 mL

## 2015-03-20 MED ORDER — ALPRAZOLAM 0.5 MG PO TABS
0.5000 mg | ORAL_TABLET | Freq: Once | ORAL | Status: AC
  Administered 2015-03-20: 09:00:00 0.5 mg via ORAL
  Filled 2015-03-20: qty 1

## 2015-03-20 MED ORDER — MIDAZOLAM HCL 2 MG/2ML IJ SOLN
INTRAMUSCULAR | Status: DC | PRN
  Administered 2015-03-20 (×3): 1 mg via INTRAVENOUS

## 2015-03-20 MED ORDER — LIDOCAINE HCL (PF) 1 % IJ SOLN
INTRAMUSCULAR | Status: AC
  Filled 2015-03-20: qty 30

## 2015-03-20 MED ORDER — ACETAMINOPHEN 325 MG PO TABS: 650.0000 mg | ORAL_TABLET | ORAL | Status: DC | PRN

## 2015-03-20 MED ORDER — FENTANYL CITRATE (PF) 100 MCG/2ML IJ SOLN
INTRAMUSCULAR | Status: AC
  Filled 2015-03-20: qty 2

## 2015-03-20 MED ORDER — HYDRALAZINE HCL 20 MG/ML IJ SOLN
INTRAMUSCULAR | Status: AC
  Filled 2015-03-20: qty 1

## 2015-03-20 MED ORDER — ASPIRIN 81 MG PO CHEW: 81.0000 mg | CHEWABLE_TABLET | ORAL | Status: DC

## 2015-03-20 MED ORDER — SODIUM CHLORIDE 0.9% FLUSH
3.0000 mL | Freq: Two times a day (BID) | INTRAVENOUS | Status: DC
  Administered 2015-03-20 – 2015-03-21 (×2): 3 mL via INTRAVENOUS

## 2015-03-20 MED ORDER — BIVALIRUDIN BOLUS VIA INFUSION - CUPID
INTRAVENOUS | Status: DC | PRN
  Administered 2015-03-20: 77.25 mg via INTRAVENOUS

## 2015-03-20 SURGICAL SUPPLY — 19 items
BALLN ANGIOSCULPT RX 2.5X15 (BALLOONS) ×2
BALLN EMERGE MR 2.0X12 (BALLOONS) ×2
BALLOON ANGIOSCULPT RX 2.5X15 (BALLOONS) ×1 IMPLANT
BALLOON EMERGE MR 2.0X12 (BALLOONS) ×1 IMPLANT
CATH MICROCATH NAVVUS (MICROCATHETER) ×1 IMPLANT
CATH VISTA GUIDE 7FR XB 3.5 (CATHETERS) ×2 IMPLANT
KIT ENCORE 26 ADVANTAGE (KITS) ×2 IMPLANT
KIT HEART LEFT (KITS) ×2 IMPLANT
MICROCATHETER NAVVUS (MICROCATHETER) ×2
PACK CARDIAC CATHETERIZATION (CUSTOM PROCEDURE TRAY) ×2 IMPLANT
SHEATH PINNACLE 7F 10CM (SHEATH) ×2 IMPLANT
STENT SYNERGY DES 3.5X12 (Permanent Stent) IMPLANT
STENT SYNERGY DES 3.5X20 (Permanent Stent) ×2 IMPLANT
STENT SYNERGY DES 3X12 (Permanent Stent) ×2 IMPLANT
TRANSDUCER W/STOPCOCK (MISCELLANEOUS) ×2 IMPLANT
TUBING CIL FLEX 10 FLL-RA (TUBING) ×2 IMPLANT
WIRE ASAHI FIELDER XT 190CM (WIRE) ×2 IMPLANT
WIRE ASAHI PROWATER 180CM (WIRE) ×2 IMPLANT
WIRE COUGAR XT STRL 190CM (WIRE) ×2 IMPLANT

## 2015-03-20 NOTE — Progress Notes (Signed)
Inpatient Diabetes Program Recommendations  AACE/ADA: New Consensus Statement on Inpatient Glycemic Control (2015)  Target Ranges:  Prepandial:   less than 140 mg/dL      Peak postprandial:   less than 180 mg/dL (1-2 hours)      Critically ill patients:  140 - 180 mg/dL   Review of Glycemic Control  Diabetes history: DM2 Outpatient Diabetes medications: Oseni 25/30 mg QD Current orders for Inpatient glycemic control: Novolog sensitive tidwc  Results for SHAHZEB, BAYAT (MRN [id number]) as of 03/20/2015 11:30  Ref. Range 03/19/2015 06:16 03/19/2015 11:20 03/19/2015 17:45 03/19/2015 21:47 03/20/2015 06:16  Glucose-Capillary Latest Ref Range: 65-99 mg/dL 254 (H) 180 (H) 149 (H) 197 (H) 184 (H)   Results for ADARIUS, ORDERS (MRN [id number]) as of 03/20/2015 11:30  Ref. Range 03/17/2015 05:47  Hemoglobin A1C Latest Ref Range: 4.8-5.6 % 13.4 (H)   Uncontrolled DM at home as indicated by HgbA1C results.  Inpatient Diabetes Program Recommendations:     Add Levemir 15 units Q24H Increase Novolog to moderate Q4H. When eating, tidwc and hs. Will likely need to be discharged on insulin, given HgbA1C results.  Will continue to follow. Thank you. Lorenda Peck, RD, LDN, CDE Inpatient Diabetes Coordinator [phone]

## 2015-03-20 NOTE — Care Management Note (Signed)
Case Management Note  Patient Details  Name: [name] MRN: [id number] Date of Birth: [birth year]  Subjective/Objective:    Patient lives with wife, pta indep.  NCM gave patient the Brinlinta 30 day savings card, his co pay is $30 , he goes to [place] [facility] on [address] and they do have Brinlinta in stock.                  Action/Plan:   Expected Discharge Date:  03/19/15               Expected Discharge Plan:  Home/Self Care  In-House Referral:     Discharge planning Services  CM Consult  Post Acute Care Choice:    Choice offered to:     DME Arranged:    DME Agency:     HH Arranged:    HH Agency:     Status of Service:  Completed, signed off  Medicare Important Message Given:    Date Medicare IM Given:    Medicare IM give by:    Date Additional Medicare IM Given:    Additional Medicare Important Message give by:     If discussed at [place] of Stay Meetings, dates discussed:    Additional Comments:  Zenon Mayo, RN 03/20/2015, 12:11 PM

## 2015-03-20 NOTE — Interval H&P Note (Signed)
Cath Lab Visit (complete for each Cath Lab visit)  Clinical Evaluation Leading to the Procedure:   ACS: Yes.    Non-ACS:    Anginal Classification: CCS IV  Anti-ischemic medical therapy: Minimal Therapy (1 class of medications)  Non-Invasive Test Results: No non-invasive testing performed  Prior CABG: No previous CABG      History and Physical Interval Note:  03/20/2015 2:25 PM  [name]  has presented today for surgery, with the diagnosis of cad  The various methods of treatment have been discussed with the patient and family. After consideration of risks, benefits and other options for treatment, the patient has consented to  Procedure(s): Coronary Stent Intervention (N/A) as a surgical intervention .  The patient's history has been reviewed, patient examined, no change in status, stable for surgery.  I have reviewed the patient's chart and labs.  Questions were answered to the patient's satisfaction.     [name]

## 2015-03-20 NOTE — Progress Notes (Signed)
CRITICAL VALUE ALERT  Critical value received:  Troponin  Date of notification:  03/20/15  Time of notification:  2100  Critical value read back:Yes.    Nurse who received alert:  Thurmond Butts  MD notified (1st page):  Expected value post cath

## 2015-03-20 NOTE — Progress Notes (Signed)
Right  Femoral sheath removed with no complications.  Pressure held x 30 min. Dressing applied. Site  with previous level 1 hematoma --marked.  Pt.denies pain at site. Pedal 2+. Vital signs stable.

## 2015-03-20 NOTE — Progress Notes (Signed)
Subjective: No CP.  Objective: Vital signs in last 24 hours: Temp:  [97.9 F (36.6 C)-98.5 F (36.9 C)] 97.9 F (36.6 C) (02/07 0731) Pulse Rate:  [0-85] 78 (02/07 0731) Resp:  [0-54] 23 (02/07 0731) BP: (110-177)/(54-100) 145/84 mmHg (02/07 0731) SpO2:  [0 %-100 %] 95 % (02/07 0731) Weight:  [227 lb 1.2 oz (103 kg)] 227 lb 1.2 oz (103 kg) (02/07 0614) Last BM Date: 03/18/15  Intake/Output from previous day: 02/06 0701 - 02/07 0700 In: 1384.5 [I.V.:1384.5] Out: 600 [Urine:600] Intake/Output this shift:    Medications Scheduled Meds: . aspirin EC  81 mg Oral Daily  . atorvastatin  80 mg Oral q1800  . carvedilol  3.125 mg Oral BID WC  . insulin aspart  0-9 Units Subcutaneous TID WC  . lisinopril  10 mg Oral Daily  . sodium chloride flush  3 mL Intravenous Q12H   Continuous Infusions:  PRN Meds:.sodium chloride, acetaminophen, acetaminophen, morphine injection, nitroGLYCERIN, ondansetron (ZOFRAN) IV, ondansetron (ZOFRAN) IV, sodium chloride flush  PE: General appearance: alert, cooperative and no distress Lungs: clear to auscultation bilaterally Heart: regular rate and rhythm, S1, S2 normal, no murmur, click, rub or gallop Abdomen: No tender, + BS Extremities: No LEE Pulses: 2+ and symmetric Skin: Warm and dry Neurologic: Grossly normal  Lab Results:   Recent Labs  03/18/15 0012 03/18/15 2350 03/20/15 0602  WBC 9.7 11.3* 8.9  HGB 14.3 14.8 12.7*  HCT 42.9 43.9 40.0  PLT 243 238 225   BMET  Recent Labs  03/18/15 2350 03/19/15 0819 03/20/15 0602  NA 136 139 138  K 4.2 4.2 4.0  CL 97* 101 102  CO2 27 28 26   GLUCOSE 319* 254* 189*  BUN 25* 22* 16  CREATININE 1.63* 1.35* 1.28*  CALCIUM 10.1 9.4 9.1   PT/INR  Recent Labs  03/19/15 0819  LABPROT 13.9  INR 1.05   Cholesterol  Recent Labs  03/18/15 0012  CHOL 227*   Lipid Panel     Component Value Date/Time   CHOL 227* 03/18/2015 0012   TRIG 450* 03/18/2015 0012   HDL 33*  03/18/2015 0012   CHOLHDL 6.9 03/18/2015 0012   VLDL UNABLE TO CALCULATE IF TRIGLYCERIDE OVER 400 mg/dL 03/18/2015 0012   LDLCALC UNABLE TO CALCULATE IF TRIGLYCERIDE OVER 400 mg/dL 03/18/2015 0012   Diagnostic Diagram           Assessment/Plan  Active Problems:   NSTEMI (non-ST elevated myocardial infarction) ([place])   Angina effort ([place])   Renal insufficiency  SP left heart cath revealing: Conclusion     Mid RCA lesion, 70% stenosed.  1st Mrg lesion, 95% stenosed.  Dist Cx lesion, 90% stenosed.  3rd Mrg lesion, 90% stenosed.  Prox LAD lesion, 80% stenosed.  Ost 1st Diag to 1st Diag lesion, 90% stenosed.  The left ventricular systolic function is normal.   Dr. Gwenlyn Found is going to take him back to the cath lab today for PCI.  SCr improving.  ASA 81, lipitor 80, coreg 3.125 BID, lisinopril 10.  Increase Coreg to 9.375.  Cardiac Rehab.  Dietary changes discussed.    LOS: 3 days    HAGER, BRYAN PA-C 03/20/2015 8:11 AM   Agree with note written by Luisa Dago [facility]  I have reviewed angio with Dr Martinique. We both feel that a percutaneous approach to the LCX OM and distal LCX (prob culprit vessel) with LAD FFR is a reasonable first approach in a young patient with borderline signig LAD/RCA  disease and nl LV fxn. Plan to do this later today. The patient understands that risks included but are not limited to stroke (1 in 1000), death (1 in 57), kidney failure [usually temporary] (1 in 500), bleeding (1 in 200), allergic reaction [possibly serious] (1 in 200). The patient understands and agrees to proceed. Keep NPO.   Quay Burow 03/20/2015 8:42 AM

## 2015-03-20 NOTE — H&P (View-Only) (Signed)
Subjective: No CP.  Objective: Vital signs in last 24 hours: Temp:  [97.9 F (36.6 C)-98.5 F (36.9 C)] 97.9 F (36.6 C) (02/07 0731) Pulse Rate:  [0-85] 78 (02/07 0731) Resp:  [0-54] 23 (02/07 0731) BP: (110-177)/(54-100) 145/84 mmHg (02/07 0731) SpO2:  [0 %-100 %] 95 % (02/07 0731) Weight:  [227 lb 1.2 oz (103 kg)] 227 lb 1.2 oz (103 kg) (02/07 0614) Last BM Date: 03/18/15  Intake/Output from previous day: 02/06 0701 - 02/07 0700 In: 1384.5 [I.V.:1384.5] Out: 600 [Urine:600] Intake/Output this shift:    Medications Scheduled Meds: . aspirin EC  81 mg Oral Daily  . atorvastatin  80 mg Oral q1800  . carvedilol  3.125 mg Oral BID WC  . insulin aspart  0-9 Units Subcutaneous TID WC  . lisinopril  10 mg Oral Daily  . sodium chloride flush  3 mL Intravenous Q12H   Continuous Infusions:  PRN Meds:.sodium chloride, acetaminophen, acetaminophen, morphine injection, nitroGLYCERIN, ondansetron (ZOFRAN) IV, ondansetron (ZOFRAN) IV, sodium chloride flush  PE: General appearance: alert, cooperative and no distress Lungs: clear to auscultation bilaterally Heart: regular rate and rhythm, S1, S2 normal, no murmur, click, rub or gallop Abdomen: No tender, + BS Extremities: No LEE Pulses: 2+ and symmetric Skin: Warm and dry Neurologic: Grossly normal  Lab Results:   Recent Labs  03/18/15 0012 03/18/15 2350 03/20/15 0602  WBC 9.7 11.3* 8.9  HGB 14.3 14.8 12.7*  HCT 42.9 43.9 40.0  PLT 243 238 225   BMET  Recent Labs  03/18/15 2350 03/19/15 0819 03/20/15 0602  NA 136 139 138  K 4.2 4.2 4.0  CL 97* 101 102  CO2 27 28 26   GLUCOSE 319* 254* 189*  BUN 25* 22* 16  CREATININE 1.63* 1.35* 1.28*  CALCIUM 10.1 9.4 9.1   PT/INR  Recent Labs  03/19/15 0819  LABPROT 13.9  INR 1.05   Cholesterol  Recent Labs  03/18/15 0012  CHOL 227*   Lipid Panel     Component Value Date/Time   CHOL 227* 03/18/2015 0012   TRIG 450* 03/18/2015 0012   HDL 33*  03/18/2015 0012   CHOLHDL 6.9 03/18/2015 0012   VLDL UNABLE TO CALCULATE IF TRIGLYCERIDE OVER 400 mg/dL 03/18/2015 0012   LDLCALC UNABLE TO CALCULATE IF TRIGLYCERIDE OVER 400 mg/dL 03/18/2015 0012   Diagnostic Diagram           Assessment/Plan  Active Problems:   NSTEMI (non-ST elevated myocardial infarction) ([place])   Angina effort ([place])   Renal insufficiency  SP left heart cath revealing: Conclusion     Mid RCA lesion, 70% stenosed.  1st Mrg lesion, 95% stenosed.  Dist Cx lesion, 90% stenosed.  3rd Mrg lesion, 90% stenosed.  Prox LAD lesion, 80% stenosed.  Ost 1st Diag to 1st Diag lesion, 90% stenosed.  The left ventricular systolic function is normal.   Dr. Gwenlyn Found is going to take him back to the cath lab today for PCI.  SCr improving.  ASA 81, lipitor 80, coreg 3.125 BID, lisinopril 10.  Increase Coreg to 9.375.  Cardiac Rehab.  Dietary changes discussed.    LOS: 3 days    HAGER, BRYAN PA-C 03/20/2015 8:11 AM   Agree with note written by Luisa Dago [facility]  I have reviewed angio with Dr Martinique. We both feel that a percutaneous approach to the LCX OM and distal LCX (prob culprit vessel) with LAD FFR is a reasonable first approach in a young patient with borderline signig LAD/RCA  disease and nl LV fxn. Plan to do this later today. The patient understands that risks included but are not limited to stroke (1 in 1000), death (1 in 50), kidney failure [usually temporary] (1 in 500), bleeding (1 in 200), allergic reaction [possibly serious] (1 in 200). The patient understands and agrees to proceed. Keep NPO.   Quay Burow 03/20/2015 8:42 AM

## 2015-03-21 ENCOUNTER — Encounter (HOSPITAL_COMMUNITY): Payer: Self-pay | Admitting: Cardiovascular Disease

## 2015-03-21 ENCOUNTER — Other Ambulatory Visit: Payer: Self-pay | Admitting: Internal Medicine

## 2015-03-21 ENCOUNTER — Inpatient Hospital Stay (HOSPITAL_COMMUNITY): Payer: BLUE CROSS/BLUE SHIELD

## 2015-03-21 DIAGNOSIS — I2510 Atherosclerotic heart disease of native coronary artery without angina pectoris: Secondary | ICD-10-CM

## 2015-03-21 LAB — GLUCOSE, CAPILLARY
GLUCOSE-CAPILLARY: 204 mg/dL — AB (ref 65–99)
GLUCOSE-CAPILLARY: 191 mg/dL — AB (ref 65–99)
GLUCOSE-CAPILLARY: 167 mg/dL — AB (ref 65–99)
Glucose-Capillary: 197 mg/dL — ABNORMAL HIGH (ref 65–99)

## 2015-03-21 LAB — BASIC METABOLIC PANEL
ANION GAP: 10 (ref 5–15)
BUN: 12 mg/dL (ref 6–20)
CHLORIDE: 102 mmol/L (ref 101–111)
CO2: 27 mmol/L (ref 22–32)
Calcium: 9 mg/dL (ref 8.9–10.3)
Creatinine, Ser: 1.26 mg/dL — ABNORMAL HIGH (ref 0.61–1.24)
GFR calc non Af Amer: >60 mL/min (ref >60)
Glucose, Bld: 160 mg/dL — ABNORMAL HIGH (ref 65–99)
POTASSIUM: 4 mmol/L (ref 3.5–5.1)
SODIUM: 139 mmol/L (ref 135–145)

## 2015-03-21 LAB — CBC
HCT: 38.6 % — ABNORMAL LOW (ref 39.0–52.0)
HEMOGLOBIN: 12.4 g/dL — AB (ref 13.0–17.0)
MCH: 27.1 pg (ref 26.0–34.0)
MCHC: 32.1 g/dL (ref 30.0–36.0)
MCV: 84.5 fL (ref 78.0–100.0)
PLATELETS: 238 10*3/uL (ref 150–400)
RBC: 4.57 MIL/uL (ref 4.22–5.81)
RDW: 13.7 % (ref 11.5–15.5)
WBC: 9.9 10*3/uL (ref 4.0–10.5)

## 2015-03-21 LAB — TROPONIN I: Troponin I: 8.05 ng/mL (ref <0.031)

## 2015-03-21 MED ORDER — SODIUM CHLORIDE 0.9 % WEIGHT BASED INFUSION: 3.0000 mL/kg/h | INTRAVENOUS | Status: DC

## 2015-03-21 MED ORDER — SODIUM CHLORIDE 0.9% FLUSH
3.0000 mL | Freq: Two times a day (BID) | INTRAVENOUS | Status: DC
  Administered 2015-03-21: 3 mL via INTRAVENOUS

## 2015-03-21 MED ORDER — SODIUM CHLORIDE 0.9% FLUSH: 3.0000 mL | INTRAVENOUS | Status: DC | PRN

## 2015-03-21 MED ORDER — SODIUM CHLORIDE 0.9 % WEIGHT BASED INFUSION: 1.0000 mL/kg/h | INTRAVENOUS | Status: DC

## 2015-03-21 MED ORDER — SODIUM CHLORIDE 0.9 % IV SOLN: 250.0000 mL | INTRAVENOUS | Status: DC | PRN

## 2015-03-21 NOTE — Progress Notes (Signed)
  Echocardiogram 2D Echocardiogram has been performed.  Jennette Dubin 03/21/2015, 3:42 PM

## 2015-03-21 NOTE — Progress Notes (Signed)
Patient Name: [name] Date of Encounter: 03/21/2015  Primary Cardiologist: Dr. Gwenlyn Found   Principal Problem:   NSTEMI (non-ST elevated myocardial infarction) [facility]) Active Problems:   Essential hypertension   Hyperlipidemia   Angina effort [facility])   Renal insufficiency    SUBJECTIVE  Denies any SOB, continuous chest pain since procedure. CP relieved by morphine and nitro gtt. Currently 4/10, improving.   CURRENT MEDS . adenosine  16 mL Intravenous Once  . aspirin EC  81 mg Oral Daily  . atorvastatin  80 mg Oral q1800  . carvedilol  9.375 mg Oral BID WC  . insulin aspart  0-5 Units Subcutaneous QHS  . insulin aspart  0-9 Units Subcutaneous TID WC  . lisinopril  10 mg Oral Daily  . sodium chloride flush  3 mL Intravenous Q12H  . ticagrelor  90 mg Oral BID    OBJECTIVE  Filed Vitals:   03/21/15 0500 03/21/15 0600 03/21/15 0700 03/21/15 0736  BP: 138/86 114/62 108/64 126/80  Pulse: 91 87 64 78  Temp:      TempSrc:      Resp: 23 20 22 10   Height:      Weight:      SpO2: 94% 90% 87% 96%    Intake/Output Summary (Last 24 hours) at 03/21/15 0801 Last data filed at 03/21/15 0752  Gross per 24 hour  Intake 1627.73 ml  Output   1325 ml  Net 302.73 ml   Filed Weights   03/19/15 0502 03/20/15 0614 03/20/15 1624  Weight: 223 lb 8 oz (101.379 kg) 227 lb 1.2 oz (103 kg) 228 lb 13.4 oz (103.8 kg)    PHYSICAL EXAM  General: Pleasant, NAD. Neuro: Alert and oriented X 3. Moves all extremities spontaneously. Psych: Normal affect. HEENT:  Normal  Neck: Supple without bruits or JVD. Lungs:  Resp regular and unlabored, CTA. Heart: RRR no s3, s4, or murmurs.  Abdomen: Soft, non-tender, non-distended, BS + x 4.  Ecchymosis over the R femoral cath site, however no bruit or pulsatile mass Extremities: No clubbing, cyanosis or edema. DP/PT/Radials 2+ and equal bilaterally.  Accessory Clinical Findings  CBC  Recent Labs  03/20/15 0602 03/21/15 0345  WBC 8.9 9.9  HGB  12.7* 12.4*  HCT 40.0 38.6*  MCV 85.5 84.5  PLT 225 [id number]   Basic Metabolic Panel  Recent Labs  03/20/15 0602 03/21/15 0345  NA 138 139  K 4.0 4.0  CL 102 102  CO2 26 27  GLUCOSE 189* 160*  BUN 16 12  CREATININE 1.28* 1.26*  CALCIUM 9.1 9.0   Cardiac Enzymes  Recent Labs  03/20/15 1730 03/20/15 2100 03/21/15 0345  TROPONINI 0.39* 1.66* 8.05*    TELE NSR     ECG  NSR with TWI in inferolateral leads  Echocardiogram 03/18/2015  LV EF: 55% -  60%  ------------------------------------------------------------------- Indications:   Chest pain 786.51.  ------------------------------------------------------------------- History:  Risk factors: Hypertension. Diabetes mellitus. Dyslipidemia.  ------------------------------------------------------------------- Study Conclusions  - Left ventricle: The cavity size was normal. Wall thickness was increased in a pattern of mild LVH. Systolic function was normal. The estimated ejection fraction was in the range of 55% to 60%. Probable hypokinesis of the basalinferior myocardium. Doppler parameters are consistent with abnormal left ventricular relaxation (grade 1 diastolic dysfunction). - Aortic valve: Mildly calcified annulus. Trileaflet; mildly calcified leaflets. - Tricuspid valve: There was trivial regurgitation. - Pulmonary arteries: Systolic pressure could not be accurately estimated. - Pericardium, extracardiac: There was no pericardial effusion.  Impressions:  - Mild LVH with LVEF 55-60%, probable basal inferior hypokinesis. Grade 1 diastolic dysfunction with normal filling pressure. Mildly sclerotic aortic valve. Trivial tricuspid regurgitation.     Radiology/Studies  Dg Chest 2 View  03/15/2015  CLINICAL DATA:  Chest pain and shortness of breath today. Initial encounter. EXAM: CHEST  2 VIEW COMPARISON:  PA and lateral chest 01/22/2012. FINDINGS: The lungs are clear. Heart size is  normal. No pneumothorax or pleural effusion. No focal bony abnormality. IMPRESSION: Negative chest. Electronically Signed   By: Inge Rise M.D.   On: 03/15/2015 19:19   Ct Angio Chest Pe W/cm &/or Wo Cm  03/16/2015  CLINICAL DATA:  Chest pain and shortness of breath for 10 days. EXAM: CT ANGIOGRAPHY CHEST WITH CONTRAST TECHNIQUE: Multidetector CT imaging of the chest was performed using the standard protocol during bolus administration of intravenous contrast. Multiplanar CT image reconstructions and MIPs were obtained to evaluate the vascular anatomy. CONTRAST:  124mL OMNIPAQUE IOHEXOL 350 MG/ML SOLN COMPARISON:  Radiographs 5 hours prior. FINDINGS: There are no filling defects within the pulmonary arteries to suggest pulmonary embolus. Thoracic aorta normal in caliber with mild atherosclerosis. Scattered coronary artery calcifications. Heart upper limits of normal in size. No mediastinal or hilar adenopathy. No pleural or pericardial effusion. Heterogeneous diffuse enlargement of the right lobe of the thyroid gland without CT defined nodule. Scattered atelectasis and hypoventilatory change. There is subpleural fat in the left lung base. Evaluation of the upper abdomen demonstrates no acute abnormality. Liver appears enlarged with steatosis. There are no acute or suspicious osseous abnormalities. Review of the MIP images confirms the above findings. IMPRESSION: 1. No pulmonary embolus or acute intrathoracic process. 2. Coronary artery calcifications. 3. Incidental findings of heterogeneous enlargement of the right thyroid gland, no CT defined nodule, suspect goiter. 4. Hepatic steatosis. Electronically Signed   By: Jeb Levering M.D.   On: 03/16/2015 00:35    ASSESSMENT AND PLAN  1. NSTEMI  - Echo 03/18/2015 EF 55-60%, grade 1 DD.  - Cath 03/19/2015 EF 55-65%, 70% mid RCA, 95% OM1, 90% distal LCx, 90% OM3, 80% prox LAD and 90% ost D1  - Cath 03/20/2015 75% mid LAD with significant FFR treated with  3.5x39mm long synergy, small distal dissection treated with 3x12 mm Synergy DES, small D1 was lost during procedure with onset of CP and EKG changes  - post procedure, trop went from 0.39 up to 1.66 --> 8.05, likely representing infarct of diagonal territory as pt did not have any chest pain before procedure started therefore LCx artery being responsible for chest pain low  - had continuous chest pain this morning, Cr stable, likely have infarction of diagonal area. EKG reviewed TWI in inferolateral leads, symptom improved with morphine. Continue high dose IV nitro. Obtain repeat echo to reassess EF after infarction. Once his current infarction cool off, will plan for LCx PCI in a few days.  2. CAD: newly diagnosed, see #1  - continue ASA, lipitor, coreg, lisinopril and Brilinta.  3. HTN 4. HLD 5. DM  Signed, Almyra Deforest PA-C Pager: [phone]  Patient seen, examined. Available data reviewed. Agree with findings, assessment, and plan as outlined by Almyra Deforest, PA-C. At the time of my evaluation, the patient is comfortable. He denies recurrence of chest pain. He states that morphine has completely resolved his chest discomfort. He remains on IV nitroglycerin at high dose. Exam reveals an obese male in no distress. Heart is regular rate and rhythm without murmur, gallop, or  rub. Lungs are clear. The right groin site shows some ecchymoses but no hematoma. There is no peripheral edema.  Cardiac catheterization films reviewed. Laboratory data is reviewed. The patient has had a periprocedural infarct related to side branch occlusion of the diagonal. He seems to be doing quite well at present and I think it would be reasonable to perform staged PCI of the severe stenotic circumflex branches tomorrow. We will cycle his enzymes and as long as they are flat or downtrending, would proceed with staged PCI tomorrow. I have reviewed the risks, indications, and alternatives to cardiac catheterization, possible  angioplasty, and stenting with the patient. Risks include but are not limited to bleeding, infection, vascular injury, stroke, myocardial infection, arrhythmia, kidney injury, radiation-related injury in the case of prolonged fluoroscopy use, emergency cardiac surgery, and death. The patient understands the risks of serious complication is 1-2 in [id number] with diagnostic cardiac cath and 1-2% or less with angioplasty/stenting.   Sherren Mocha, M.D. 03/21/2015 11:25 AM

## 2015-03-21 NOTE — Progress Notes (Addendum)
Pt just got comfortable and not up for walking now. Discussed MI, stents, Brilinta, smoking cessation and other risk factors. Gave diet sheets and videos for him to begin reviewing. Voiced understanding. Sts quitting smoking will not be a problem, that he smoked for pleasure. Pt has questions about returning to work as he is in the middle of training for a new job. Will f/u. [id number] Yves Dill CES, ACSM 11:36 AM 03/21/2015

## 2015-03-21 NOTE — Progress Notes (Signed)
Pt stating that he has continuous pressure and numbness in the middle of his chest, 8/10 pain. Nitro at 100. PA paged.

## 2015-03-21 NOTE — Care Management Note (Signed)
Case Management Note  Patient Details  Name: [name] MRN: [id number] Date of Birth: 04-Jun-1960  Subjective/Objective:        Adm w nstemi            Action/Plan: lives w wife   Expected Discharge Date:                Expected Discharge Plan:  Home/Self Care  In-House Referral:     Discharge planning Services  CM Consult, Medication Assistance  Post Acute Care Choice:    Choice offered to:     DME Arranged:    DME Agency:     HH Arranged:    HH Agency:     Status of Service:  Completed, signed off  Medicare Important Message Given:    Date Medicare IM Given:    Medicare IM give by:    Date Additional Medicare IM Given:    Additional Medicare Important Message give by:     If discussed at [place] of Stay Meetings, dates discussed:    Additional Comments:gave pt 30day free and copay card for brilinta. Pt has [employer] ins.  Lacretia Leigh, RN   03/21/2015, 9:52 AM

## 2015-03-22 ENCOUNTER — Encounter (HOSPITAL_COMMUNITY)
Admission: EM | Disposition: A | Discharge status: Home / Self Care | Payer: Self-pay | Source: Home / Self Care | Attending: Cardiovascular Disease

## 2015-03-22 ENCOUNTER — Encounter (HOSPITAL_COMMUNITY): Payer: Self-pay | Admitting: Cardiovascular Disease

## 2015-03-22 HISTORY — PX: CARDIAC CATHETERIZATION: SHX172

## 2015-03-22 LAB — GLUCOSE, CAPILLARY
GLUCOSE-CAPILLARY: 153 mg/dL — AB (ref 65–99)
Glucose-Capillary: 170 mg/dL — ABNORMAL HIGH (ref 65–99)
Glucose-Capillary: 204 mg/dL — ABNORMAL HIGH (ref 65–99)
Glucose-Capillary: 135 mg/dL — ABNORMAL HIGH (ref 65–99)

## 2015-03-22 LAB — POCT ACTIVATED CLOTTING TIME: ACTIVATED CLOTTING TIME: 358 s

## 2015-03-22 SURGERY — CORONARY STENT INTERVENTION
Anesthesia: LOCAL

## 2015-03-22 MED ORDER — VERAPAMIL HCL 2.5 MG/ML IV SOLN
INTRA_ARTERIAL | Status: DC | PRN
  Administered 2015-03-22: 7.5 mL via INTRA_ARTERIAL

## 2015-03-22 MED ORDER — FENTANYL CITRATE (PF) 100 MCG/2ML IJ SOLN
INTRAMUSCULAR | Status: AC
  Filled 2015-03-22: qty 2

## 2015-03-22 MED ORDER — BIVALIRUDIN BOLUS VIA INFUSION - CUPID
INTRAVENOUS | Status: DC | PRN
  Administered 2015-03-22: 75.15 mg via INTRAVENOUS

## 2015-03-22 MED ORDER — FENTANYL CITRATE (PF) 100 MCG/2ML IJ SOLN
INTRAMUSCULAR | Status: DC | PRN
  Administered 2015-03-22 (×2): 25 ug via INTRAVENOUS

## 2015-03-22 MED ORDER — LIDOCAINE HCL (PF) 1 % IJ SOLN
INTRAMUSCULAR | Status: AC
  Filled 2015-03-22: qty 30

## 2015-03-22 MED ORDER — SODIUM CHLORIDE 0.9 % IV SOLN: INTRAVENOUS | Status: AC

## 2015-03-22 MED ORDER — SODIUM CHLORIDE 0.9 % WEIGHT BASED INFUSION
1.0000 mL/kg/h | INTRAVENOUS | Status: DC
Start: 2015-03-22 — End: 2015-03-22

## 2015-03-22 MED ORDER — SODIUM CHLORIDE 0.9 % IV SOLN
250.0000 mg | INTRAVENOUS | Status: DC | PRN
  Administered 2015-03-22: 1.75 mg/kg/h via INTRAVENOUS
  Administered 2015-03-22: 250 mg

## 2015-03-22 MED ORDER — SODIUM CHLORIDE 0.9% FLUSH: 3.0000 mL | INTRAVENOUS | Status: DC | PRN

## 2015-03-22 MED ORDER — HEART ATTACK BOUNCING BOOK
Freq: Once | Status: AC
Start: 2015-03-22 — End: 2015-03-22
  Administered 2015-03-22: 20:00:00
  Filled 2015-03-22: qty 1

## 2015-03-22 MED ORDER — ACETAMINOPHEN 325 MG PO TABS: 650.0000 mg | ORAL_TABLET | ORAL | Status: DC | PRN

## 2015-03-22 MED ORDER — SODIUM CHLORIDE 0.9% FLUSH: 3.0000 mL | Freq: Two times a day (BID) | INTRAVENOUS | Status: DC

## 2015-03-22 MED ORDER — ANGIOPLASTY BOOK
Freq: Once | Status: AC
  Administered 2015-03-22: 20:00:00
  Filled 2015-03-22: qty 1

## 2015-03-22 MED ORDER — MORPHINE SULFATE (PF) 10 MG/ML IV SOLN: INTRAVENOUS | Status: DC | PRN

## 2015-03-22 MED ORDER — IOHEXOL 350 MG/ML SOLN
INTRAVENOUS | Status: DC | PRN
  Administered 2015-03-22: 155 mL via INTRA_ARTERIAL

## 2015-03-22 MED ORDER — HEPARIN (PORCINE) IN NACL 2-0.9 UNIT/ML-% IJ SOLN
INTRAMUSCULAR | Status: AC
  Filled 2015-03-22: qty 1000

## 2015-03-22 MED ORDER — ADENOSINE 12 MG/4ML IV SOLN
16.0000 mL | Freq: Once | INTRAVENOUS | Status: AC
  Administered 2015-03-22: 48 mg via INTRAVENOUS
  Filled 2015-03-22: qty 16

## 2015-03-22 MED ORDER — ASPIRIN 81 MG PO CHEW: 81.0000 mg | CHEWABLE_TABLET | Freq: Every day | ORAL | Status: DC

## 2015-03-22 MED ORDER — SODIUM CHLORIDE 0.9 % IV SOLN: 250.0000 mL | INTRAVENOUS | Status: DC | PRN

## 2015-03-22 MED ORDER — SODIUM CHLORIDE 0.9 % WEIGHT BASED INFUSION
3.0000 mL/kg/h | INTRAVENOUS | Status: DC
  Administered 2015-03-22: 3 mL/kg/h via INTRAVENOUS

## 2015-03-22 MED ORDER — ONDANSETRON HCL 4 MG/2ML IJ SOLN: 4.0000 mg | Freq: Four times a day (QID) | INTRAMUSCULAR | Status: DC | PRN

## 2015-03-22 MED ORDER — MORPHINE SULFATE (PF) 10 MG/ML IV SOLN
INTRAVENOUS | Status: AC
  Filled 2015-03-22: qty 1

## 2015-03-22 MED ORDER — HEPARIN (PORCINE) IN NACL 2-0.9 UNIT/ML-% IJ SOLN
INTRAMUSCULAR | Status: DC | PRN
  Administered 2015-03-22: 09:00:00

## 2015-03-22 MED ORDER — VERAPAMIL HCL 2.5 MG/ML IV SOLN
INTRAVENOUS | Status: AC
  Filled 2015-03-22: qty 2

## 2015-03-22 MED ORDER — ASPIRIN EC 81 MG PO TBEC
81.0000 mg | DELAYED_RELEASE_TABLET | Freq: Every day | ORAL | Status: DC
  Administered 2015-03-22 – 2015-03-23 (×2): 81 mg via ORAL
  Filled 2015-03-22: qty 1

## 2015-03-22 MED ORDER — ASPIRIN 81 MG PO CHEW
81.0000 mg | CHEWABLE_TABLET | ORAL | Status: AC
  Administered 2015-03-22: 81 mg via ORAL
  Filled 2015-03-22: qty 1

## 2015-03-22 MED ORDER — MIDAZOLAM HCL 2 MG/2ML IJ SOLN
INTRAMUSCULAR | Status: DC | PRN
  Administered 2015-03-22: 1 mg via INTRAVENOUS

## 2015-03-22 MED ORDER — MIDAZOLAM HCL 2 MG/2ML IJ SOLN
INTRAMUSCULAR | Status: AC
  Filled 2015-03-22: qty 2

## 2015-03-22 MED ORDER — MORPHINE SULFATE (PF) 2 MG/ML IV SOLN
INTRAVENOUS | Status: AC
  Filled 2015-03-22: qty 1

## 2015-03-22 MED ORDER — MORPHINE SULFATE (PF) 10 MG/ML IV SOLN
INTRAVENOUS | Status: DC | PRN
  Administered 2015-03-22: 2 mg via INTRAVENOUS

## 2015-03-22 MED ORDER — BIVALIRUDIN 250 MG IV SOLR
INTRAVENOUS | Status: AC
  Filled 2015-03-22: qty 250

## 2015-03-22 MED ORDER — HYDRALAZINE HCL 20 MG/ML IJ SOLN
INTRAMUSCULAR | Status: AC
  Filled 2015-03-22: qty 1

## 2015-03-22 MED ORDER — NITROGLYCERIN 1 MG/10 ML FOR IR/CATH LAB
INTRA_ARTERIAL | Status: AC
  Filled 2015-03-22: qty 10

## 2015-03-22 MED ORDER — MORPHINE SULFATE (PF) 2 MG/ML IV SOLN
2.0000 mg | INTRAVENOUS | Status: DC | PRN
  Administered 2015-03-22: 2 mg via INTRAVENOUS

## 2015-03-22 MED ORDER — TICAGRELOR 90 MG PO TABS: 90.0000 mg | ORAL_TABLET | Freq: Two times a day (BID) | ORAL | Status: DC

## 2015-03-22 SURGICAL SUPPLY — 19 items
BALLN EMERGE MR 2.0X12 (BALLOONS) ×2
BALLN ~~LOC~~ EUPHORA RX 2.5X20 (BALLOONS) ×2
BALLOON EMERGE MR 2.0X12 (BALLOONS) ×1 IMPLANT
BALLOON ~~LOC~~ EUPHORA RX 2.5X20 (BALLOONS) ×1 IMPLANT
CATH MICROCATH NAVVUS (MICROCATHETER) ×1 IMPLANT
CATH VISTA GUIDE 6FR XB3.5 (CATHETERS) ×2 IMPLANT
GUIDE CATH RUNWAY 6FR FR4 (CATHETERS) ×2 IMPLANT
KIT ENCORE 26 ADVANTAGE (KITS) ×2 IMPLANT
KIT HEART LEFT (KITS) ×2 IMPLANT
MICROCATHETER NAVVUS (MICROCATHETER) ×2
PACK CARDIAC CATHETERIZATION (CUSTOM PROCEDURE TRAY) ×2 IMPLANT
SHEATH PINNACLE 6F 10CM (SHEATH) ×2 IMPLANT
STENT SYNERGY DES 2.25X28 (Permanent Stent) ×2 IMPLANT
STENT SYNERGY DES 2.5X16 (Permanent Stent) ×2 IMPLANT
TRANSDUCER W/STOPCOCK (MISCELLANEOUS) ×2 IMPLANT
TUBING CIL FLEX 10 FLL-RA (TUBING) ×2 IMPLANT
WIRE ASAHI PROWATER 180CM (WIRE) ×2 IMPLANT
WIRE EMERALD 3MM-J .035X150CM (WIRE) ×2 IMPLANT
WIRE HI TORQ VERSACORE-J 145CM (WIRE) ×2 IMPLANT

## 2015-03-22 NOTE — Progress Notes (Signed)
Sleeping. Waiting on room assignment for 6500

## 2015-03-22 NOTE — Interval H&P Note (Signed)
Cath Lab Visit (complete for each Cath Lab visit)  Clinical Evaluation Leading to the Procedure:   ACS: Yes.    Non-ACS:    Anginal Classification: CCS IV  Anti-ischemic medical therapy: No Therapy  Non-Invasive Test Results: No non-invasive testing performed  Prior CABG: No previous CABG      History and Physical Interval Note:  03/22/2015 7:34 AM  [name]  has presented today for surgery, with the diagnosis of cp  The various methods of treatment have been discussed with the patient and family. After consideration of risks, benefits and other options for treatment, the patient has consented to  Procedure(s): Coronary Stent Intervention (N/A) as a surgical intervention .  The patient's history has been reviewed, patient examined, no change in status, stable for surgery.  I have reviewed the patient's chart and labs.  Questions were answered to the patient's satisfaction.     Quay Burow

## 2015-03-22 NOTE — H&P (View-Only) (Signed)
Patient Name: [name] Date of Encounter: 03/21/2015  Primary Cardiologist: Dr. Gwenlyn Found   Principal Problem:   NSTEMI (non-ST elevated myocardial infarction) [facility]) Active Problems:   Essential hypertension   Hyperlipidemia   Angina effort [facility])   Renal insufficiency    SUBJECTIVE  Denies any SOB, continuous chest pain since procedure. CP relieved by morphine and nitro gtt. Currently 4/10, improving.   CURRENT MEDS . adenosine  16 mL Intravenous Once  . aspirin EC  81 mg Oral Daily  . atorvastatin  80 mg Oral q1800  . carvedilol  9.375 mg Oral BID WC  . insulin aspart  0-5 Units Subcutaneous QHS  . insulin aspart  0-9 Units Subcutaneous TID WC  . lisinopril  10 mg Oral Daily  . sodium chloride flush  3 mL Intravenous Q12H  . ticagrelor  90 mg Oral BID    OBJECTIVE  Filed Vitals:   03/21/15 0500 03/21/15 0600 03/21/15 0700 03/21/15 0736  BP: 138/86 114/62 108/64 126/80  Pulse: 91 87 64 78  Temp:      TempSrc:      Resp: 23 20 22 10   Height:      Weight:      SpO2: 94% 90% 87% 96%    Intake/Output Summary (Last 24 hours) at 03/21/15 0801 Last data filed at 03/21/15 0752  Gross per 24 hour  Intake 1627.73 ml  Output   1325 ml  Net 302.73 ml   Filed Weights   03/19/15 0502 03/20/15 0614 03/20/15 1624  Weight: 223 lb 8 oz (101.379 kg) 227 lb 1.2 oz (103 kg) 228 lb 13.4 oz (103.8 kg)    PHYSICAL EXAM  General: Pleasant, NAD. Neuro: Alert and oriented X 3. Moves all extremities spontaneously. Psych: Normal affect. HEENT:  Normal  Neck: Supple without bruits or JVD. Lungs:  Resp regular and unlabored, CTA. Heart: RRR no s3, s4, or murmurs.  Abdomen: Soft, non-tender, non-distended, BS + x 4.  Ecchymosis over the R femoral cath site, however no bruit or pulsatile mass Extremities: No clubbing, cyanosis or edema. DP/PT/Radials 2+ and equal bilaterally.  Accessory Clinical Findings  CBC  Recent Labs  03/20/15 0602 03/21/15 0345  WBC 8.9 9.9  HGB  12.7* 12.4*  HCT 40.0 38.6*  MCV 85.5 84.5  PLT 225 [id number]   Basic Metabolic Panel  Recent Labs  03/20/15 0602 03/21/15 0345  NA 138 139  K 4.0 4.0  CL 102 102  CO2 26 27  GLUCOSE 189* 160*  BUN 16 12  CREATININE 1.28* 1.26*  CALCIUM 9.1 9.0   Cardiac Enzymes  Recent Labs  03/20/15 1730 03/20/15 2100 03/21/15 0345  TROPONINI 0.39* 1.66* 8.05*    TELE NSR     ECG  NSR with TWI in inferolateral leads  Echocardiogram 03/18/2015  LV EF: 55% -  60%  ------------------------------------------------------------------- Indications:   Chest pain 786.51.  ------------------------------------------------------------------- History:  Risk factors: Hypertension. Diabetes mellitus. Dyslipidemia.  ------------------------------------------------------------------- Study Conclusions  - Left ventricle: The cavity size was normal. Wall thickness was increased in a pattern of mild LVH. Systolic function was normal. The estimated ejection fraction was in the range of 55% to 60%. Probable hypokinesis of the basalinferior myocardium. Doppler parameters are consistent with abnormal left ventricular relaxation (grade 1 diastolic dysfunction). - Aortic valve: Mildly calcified annulus. Trileaflet; mildly calcified leaflets. - Tricuspid valve: There was trivial regurgitation. - Pulmonary arteries: Systolic pressure could not be accurately estimated. - Pericardium, extracardiac: There was no pericardial effusion.  Impressions:  - Mild LVH with LVEF 55-60%, probable basal inferior hypokinesis. Grade 1 diastolic dysfunction with normal filling pressure. Mildly sclerotic aortic valve. Trivial tricuspid regurgitation.     Radiology/Studies  Dg Chest 2 View  03/15/2015  CLINICAL DATA:  Chest pain and shortness of breath today. Initial encounter. EXAM: CHEST  2 VIEW COMPARISON:  PA and lateral chest 01/22/2012. FINDINGS: The lungs are clear. Heart size is  normal. No pneumothorax or pleural effusion. No focal bony abnormality. IMPRESSION: Negative chest. Electronically Signed   By: Inge Rise M.D.   On: 03/15/2015 19:19   Ct Angio Chest Pe W/cm &/or Wo Cm  03/16/2015  CLINICAL DATA:  Chest pain and shortness of breath for 10 days. EXAM: CT ANGIOGRAPHY CHEST WITH CONTRAST TECHNIQUE: Multidetector CT imaging of the chest was performed using the standard protocol during bolus administration of intravenous contrast. Multiplanar CT image reconstructions and MIPs were obtained to evaluate the vascular anatomy. CONTRAST:  162mL OMNIPAQUE IOHEXOL 350 MG/ML SOLN COMPARISON:  Radiographs 5 hours prior. FINDINGS: There are no filling defects within the pulmonary arteries to suggest pulmonary embolus. Thoracic aorta normal in caliber with mild atherosclerosis. Scattered coronary artery calcifications. Heart upper limits of normal in size. No mediastinal or hilar adenopathy. No pleural or pericardial effusion. Heterogeneous diffuse enlargement of the right lobe of the thyroid gland without CT defined nodule. Scattered atelectasis and hypoventilatory change. There is subpleural fat in the left lung base. Evaluation of the upper abdomen demonstrates no acute abnormality. Liver appears enlarged with steatosis. There are no acute or suspicious osseous abnormalities. Review of the MIP images confirms the above findings. IMPRESSION: 1. No pulmonary embolus or acute intrathoracic process. 2. Coronary artery calcifications. 3. Incidental findings of heterogeneous enlargement of the right thyroid gland, no CT defined nodule, suspect goiter. 4. Hepatic steatosis. Electronically Signed   By: Jeb Levering M.D.   On: 03/16/2015 00:35    ASSESSMENT AND PLAN  1. NSTEMI  - Echo 03/18/2015 EF 55-60%, grade 1 DD.  - Cath 03/19/2015 EF 55-65%, 70% mid RCA, 95% OM1, 90% distal LCx, 90% OM3, 80% prox LAD and 90% ost D1  - Cath 03/20/2015 75% mid LAD with significant FFR treated with  3.5x59mm long synergy, small distal dissection treated with 3x12 mm Synergy DES, small D1 was lost during procedure with onset of CP and EKG changes  - post procedure, trop went from 0.39 up to 1.66 --> 8.05, likely representing infarct of diagonal territory as pt did not have any chest pain before procedure started therefore LCx artery being responsible for chest pain low  - had continuous chest pain this morning, Cr stable, likely have infarction of diagonal area. EKG reviewed TWI in inferolateral leads, symptom improved with morphine. Continue high dose IV nitro. Obtain repeat echo to reassess EF after infarction. Once his current infarction cool off, will plan for LCx PCI in a few days.  2. CAD: newly diagnosed, see #1  - continue ASA, lipitor, coreg, lisinopril and Brilinta.  3. HTN 4. HLD 5. DM  Signed, Almyra Deforest PA-C Pager: [phone]  Patient seen, examined. Available data reviewed. Agree with findings, assessment, and plan as outlined by Almyra Deforest, PA-C. At the time of my evaluation, the patient is comfortable. He denies recurrence of chest pain. He states that morphine has completely resolved his chest discomfort. He remains on IV nitroglycerin at high dose. Exam reveals an obese male in no distress. Heart is regular rate and rhythm without murmur, gallop, or  rub. Lungs are clear. The right groin site shows some ecchymoses but no hematoma. There is no peripheral edema.  Cardiac catheterization films reviewed. Laboratory data is reviewed. The patient has had a periprocedural infarct related to side branch occlusion of the diagonal. He seems to be doing quite well at present and I think it would be reasonable to perform staged PCI of the severe stenotic circumflex branches tomorrow. We will cycle his enzymes and as long as they are flat or downtrending, would proceed with staged PCI tomorrow. I have reviewed the risks, indications, and alternatives to cardiac catheterization, possible  angioplasty, and stenting with the patient. Risks include but are not limited to bleeding, infection, vascular injury, stroke, myocardial infection, arrhythmia, kidney injury, radiation-related injury in the case of prolonged fluoroscopy use, emergency cardiac surgery, and death. The patient understands the risks of serious complication is 1-2 in [id number] with diagnostic cardiac cath and 1-2% or less with angioplasty/stenting.   Sherren Mocha, M.D. 03/21/2015 11:25 AM

## 2015-03-23 ENCOUNTER — Inpatient Hospital Stay (HOSPITAL_COMMUNITY): Admission: RE | Admit: 2015-03-23 | Payer: BLUE CROSS/BLUE SHIELD | Source: Ambulatory Visit

## 2015-03-23 ENCOUNTER — Encounter (HOSPITAL_COMMUNITY): Payer: Self-pay | Admitting: Physician Assistant

## 2015-03-23 ENCOUNTER — Other Ambulatory Visit: Payer: Self-pay

## 2015-03-23 DIAGNOSIS — E1149 Type 2 diabetes mellitus with other diabetic neurological complication: Secondary | ICD-10-CM

## 2015-03-23 DIAGNOSIS — E1169 Type 2 diabetes mellitus with other specified complication: Secondary | ICD-10-CM

## 2015-03-23 DIAGNOSIS — Z720 Tobacco use: Secondary | ICD-10-CM | POA: Diagnosis present

## 2015-03-23 DIAGNOSIS — E669 Obesity, unspecified: Secondary | ICD-10-CM | POA: Diagnosis present

## 2015-03-23 DIAGNOSIS — E7800 Pure hypercholesterolemia, unspecified: Secondary | ICD-10-CM | POA: Diagnosis present

## 2015-03-23 DIAGNOSIS — Z955 Presence of coronary angioplasty implant and graft: Secondary | ICD-10-CM | POA: Diagnosis present

## 2015-03-23 DIAGNOSIS — I2510 Atherosclerotic heart disease of native coronary artery without angina pectoris: Secondary | ICD-10-CM | POA: Diagnosis present

## 2015-03-23 LAB — CBC
HEMATOCRIT: 36.3 % — AB (ref 39.0–52.0)
HEMOGLOBIN: 12.2 g/dL — AB (ref 13.0–17.0)
MCH: 28.6 pg (ref 26.0–34.0)
MCHC: 33.6 g/dL (ref 30.0–36.0)
MCV: 85 fL (ref 78.0–100.0)
Platelets: 233 10*3/uL (ref 150–400)
RBC: 4.27 MIL/uL (ref 4.22–5.81)
RDW: 14 % (ref 11.5–15.5)
WBC: 10.8 10*3/uL — AB (ref 4.0–10.5)

## 2015-03-23 LAB — BASIC METABOLIC PANEL
ANION GAP: 9 (ref 5–15)
BUN: 13 mg/dL (ref 6–20)
CHLORIDE: 104 mmol/L (ref 101–111)
CO2: 25 mmol/L (ref 22–32)
Calcium: 8.7 mg/dL — ABNORMAL LOW (ref 8.9–10.3)
Creatinine, Ser: 1.16 mg/dL (ref 0.61–1.24)
Glucose, Bld: 153 mg/dL — ABNORMAL HIGH (ref 65–99)
POTASSIUM: 3.2 mmol/L — AB (ref 3.5–5.1)
SODIUM: 138 mmol/L (ref 135–145)

## 2015-03-23 LAB — GLUCOSE, CAPILLARY: Glucose-Capillary: 162 mg/dL — ABNORMAL HIGH (ref 65–99)

## 2015-03-23 MED ORDER — NITROGLYCERIN 0.4 MG SL SUBL: 0.4000 mg | SUBLINGUAL_TABLET | SUBLINGUAL | Status: DC | PRN

## 2015-03-23 MED ORDER — POTASSIUM CHLORIDE CRYS ER 20 MEQ PO TBCR
40.0000 meq | EXTENDED_RELEASE_TABLET | Freq: Once | ORAL | Status: AC
  Administered 2015-03-23: 40 meq via ORAL
  Filled 2015-03-23: qty 2

## 2015-03-23 MED ORDER — ASPIRIN 81 MG PO TBEC: 81.0000 mg | DELAYED_RELEASE_TABLET | Freq: Every day | ORAL | Status: DC

## 2015-03-23 MED ORDER — ATORVASTATIN CALCIUM 80 MG PO TABS: 80.0000 mg | ORAL_TABLET | Freq: Every day | ORAL | Status: DC

## 2015-03-23 MED ORDER — TICAGRELOR 90 MG PO TABS: 90.0000 mg | ORAL_TABLET | Freq: Two times a day (BID) | ORAL | Status: DC

## 2015-03-23 MED ORDER — CARVEDILOL 3.125 MG PO TABS: 9.3750 mg | ORAL_TABLET | Freq: Two times a day (BID) | ORAL | Status: DC

## 2015-03-23 MED FILL — Morphine Sulfate Inj 10 MG/ML: INTRAMUSCULAR | Qty: 1 | Status: AC

## 2015-03-23 MED FILL — Morphine Sulfate Inj 2 MG/ML: INTRAMUSCULAR | Qty: 1 | Status: CN

## 2015-03-23 NOTE — Discharge Summary (Signed)
Discharge Summary    Patient ID: [name],  MRN: [id number], DOB/AGE: 03-13-1960 55 y.o.  Admit date: 03/17/2015 Discharge date: 03/23/2015  Primary Care Provider: No primary care provider on file. Primary Cardiologist: Dr. Gwenlyn Found   Discharge Diagnoses    Principal Problem:   NSTEMI (non-ST elevated myocardial infarction) [facility])- LHC with severe 3V CAD  (70% mid RCA, 95% OM1, 90% distal LCx, 90% OM3, 80% prox LAD and 90% ost D1) s/p DES to mLAD c/b small dissction Rx'd with DES, staged ost Ramus PCI/DES and dLCx s/p PCI/DES  Active Problems:   Essential hypertension   Hyperlipidemia   CAD (coronary artery disease)   Diabetes mellitus type 2 in obese (HCC)   Hypercholesteremia   Tobacco abuse   Obesity   Allergies Allergies  Allergen Reactions  . Aspirin Other (See Comments)    Upset stomach       History of Present Illness     [name] is a 55 y.o. male with a history of obesity, HTN, T2DM, HLD, and tobacco abuse who presented to [facility] on 03/17/15 with chest pain and found to have NSTEMI.   Admitted from 2/4-2/10/17 for NSTEMI. He underwent LHC on 03/19/15 which revealed 3V CAD (70% mid RCA, 95% OM1, 90% distal LCx, 90% OM3, 80% prox LAD and 90% ost D1) with normal LV function. Initially it was felt be was best suited for CABG. Then after discussion with Dr. Martinique, it was decided to proceed with that a percutaneous approach to the LCX OM and distal LCX (prob culprit vessel) with LAD FFR as the patient was young and had normal LV function. Echo 03/18/2015 EF 55-60%, grade 1 DD. He underwent repeat cath 03/20/2015 75% mid LAD with significant FFR treated with 3.5x55mm long synergy, small distal dissection treated with 3x12 mm Synergy DES, small D1 was lost during procedure with onset of CP and EKG changes. Post procedure, trop went from 0.39 up to 1.66 --> 8.05, likely representing infarct of diagonal territory as pt did not have any chest pain before procedure started therefore LCx  artery being responsible for chest pain. Repeat 2D ECHO on 03/21/15 with no changes despite diag infarction during cath. He then underwent last LHC on 03/22/15 with ost Ramus 95% sten s/p PCI/DES and dLCx 95% sten s/p PCI/DES. He was started on aspirin and Brilinta as well as atorvastatin 80, lisinopril 10mg  and Coreg 9.375mg  BID and discharged in good condition on 03/23/2015. Of note his hemoglobin A1c was noted to be over 13 and he was asked to establish care with PCP to help manage this. He voiced understanding.   Hospital Course     Consultants: none  NSTEMI:  -- He underwent LHC on 03/19/15 which revealed 3V CAD (70% mid RCA, 95% OM1, 90% distal LCx, 90% OM3, 80% prox LAD and 90% ost D1) with normal LV function. Initially it was felt be was best suited for CABG. Then after discussion with Dr. Martinique, it was decided to proceed with that a percutaneous approach to the LCX OM and distal LCX (prob culprit vessel) with LAD FFR as the patient was young and had normal LV function.  -- Echo 03/18/2015 EF 55-60%, grade 1 DD. -- Cath 03/20/2015 75% mid LAD with significant FFR treated with 3.5x50mm long synergy, small distal dissection treated with 3x12 mm Synergy DES, small D1 was lost during procedure with onset of CP and EKG changes -- Post procedure, trop went from 0.39 up to 1.66 --> 8.05, likely representing  infarct of diagonal territory as pt did not have any chest pain before procedure started therefore LCx artery being responsible for chest pain  -- Echo 03/21/15 EF 55-60% with no change from previous despite diag infarction during cath.  -- Cath 03/22/15 mRCA 60% sten, oRamus 95% sten s/p DES and dLCx 95% sten s/p DES  2. CAD: newly diagnosed, see #1 - continue ASA, lipitor, coreg, lisinopril and Brilinta.  3. HTN: BP well controlled on lisinopril 10mg  dail and Coreg 9.375mg  BID. 4. HLD w/ hypertriglyceridemia: unable to calculate LDL as TG >450. Continue high dose statin (recent LFTs  normal). 5. DM T2: uncontrolled: HgA1C 13.4. He will need to follow up with his PCP about this.  6. Obesity: diet and exercise counseled  7. Tobacco abuse: he is ready to quit 8. Hypokalemia: supplemented on discharge. Will check a BMET in 1 week   The patient has had an uncomplicated hospital course and is recovering well. The femoral catheter site is stable He has been seen by Dr. Ron Parker today and deemed ready for discharge home. All follow-up appointments have been scheduled. Smoking cessation was disscussed in length. A written RX for a 30 day free supply of Brilinta was provided for the patient. A work excuse note was provided as well. Discharge medications are listed below.  _____________  Discharge Vitals Blood pressure 126/86, pulse 87, temperature 98.2 F (36.8 C), temperature source Oral, resp. rate 21, height 5\' 7"  (1.702 m), weight 224 lb 13.9 oz (102 kg), SpO2 97 %.  Filed Weights   03/20/15 1624 03/22/15 0500 03/23/15 0434  Weight: 228 lb 13.4 oz (103.8 kg) 220 lb 14.4 oz (100.2 kg) 224 lb 13.9 oz (102 kg)    Labs & Radiologic Studies     CBC  Recent Labs  03/21/15 0345 03/23/15 0759  WBC 9.9 10.8*  HGB 12.4* 12.2*  HCT 38.6* 36.3*  MCV 84.5 85.0  PLT 238 0000000   Basic Metabolic Panel  Recent Labs  03/21/15 0345 03/23/15 0759  NA 139 138  K 4.0 3.2*  CL 102 104  CO2 27 25  GLUCOSE 160* 153*  BUN 12 13  CREATININE 1.26* 1.16  CALCIUM 9.0 8.7*   Cardiac Enzymes  Recent Labs  03/20/15 1730 03/20/15 2100 03/21/15 0345  TROPONINI 0.39* 1.66* 8.05*     Dg Chest 2 View  03/15/2015  CLINICAL DATA:  Chest pain and shortness of breath today. Initial encounter. EXAM: CHEST  2 VIEW COMPARISON:  PA and lateral chest 01/22/2012. FINDINGS: The lungs are clear. Heart size is normal. No pneumothorax or pleural effusion. No focal bony abnormality. IMPRESSION: Negative chest. Electronically Signed   By: Inge Rise M.D.   On: 03/15/2015 19:19   Ct Angio Chest  Pe W/cm &/or Wo Cm  03/16/2015  CLINICAL DATA:  Chest pain and shortness of breath for 10 days. EXAM: CT ANGIOGRAPHY CHEST WITH CONTRAST TECHNIQUE: Multidetector CT imaging of the chest was performed using the standard protocol during bolus administration of intravenous contrast. Multiplanar CT image reconstructions and MIPs were obtained to evaluate the vascular anatomy. CONTRAST:  166mL OMNIPAQUE IOHEXOL 350 MG/ML SOLN COMPARISON:  Radiographs 5 hours prior. FINDINGS: There are no filling defects within the pulmonary arteries to suggest pulmonary embolus. Thoracic aorta normal in caliber with mild atherosclerosis. Scattered coronary artery calcifications. Heart upper limits of normal in size. No mediastinal or hilar adenopathy. No pleural or pericardial effusion. Heterogeneous diffuse enlargement of the right lobe of the thyroid gland without CT  defined nodule. Scattered atelectasis and hypoventilatory change. There is subpleural fat in the left lung base. Evaluation of the upper abdomen demonstrates no acute abnormality. Liver appears enlarged with steatosis. There are no acute or suspicious osseous abnormalities. Review of the MIP images confirms the above findings. IMPRESSION: 1. No pulmonary embolus or acute intrathoracic process. 2. Coronary artery calcifications. 3. Incidental findings of heterogeneous enlargement of the right thyroid gland, no CT defined nodule, suspect goiter. 4. Hepatic steatosis. Electronically Signed   By: Jeb Levering M.D.   On: 03/16/2015 00:35     Diagnostic Studies/Procedures    Echocardiogram 03/18/2015   LV EF: 55% - 60% Study Conclusions - Left ventricle: The cavity size was normal. Wall thickness was increased in a pattern of mild LVH. Systolic function was normal. The estimated ejection fraction was in the range of 55% to 60%. Probable hypokinesis of the basalinferior myocardium. Doppler parameters are consistent with abnormal left ventricular relaxation (grade  1 diastolic dysfunction). - Aortic valve: Mildly calcified annulus. Trileaflet; mildly calcified leaflets. - Tricuspid valve: There was trivial regurgitation. - Pulmonary arteries: Systolic pressure could not be accurately estimated. - Pericardium, extracardiac: There was no pericardial effusion. Impressions: - Mild LVH with LVEF 55-60%, probable basal inferior hypokinesis. Grade 1 diastolic dysfunction with normal filling pressure. Mildly sclerotic aortic valve. Trivial tricuspid regurgitation.    LHC 03/19/15:   Conclusion    Mid RCA lesion, 70% stenosed.  1st Mrg lesion, 95% stenosed.  Dist Cx lesion, 90% stenosed.  3rd Mrg lesion, 90% stenosed.  Prox LAD lesion, 80% stenosed.  Ost 1st Diag to 1st Diag lesion, 90% stenosed.  The left ventricular systolic function is normal.     _____________    Disposition   Pt is being discharged home today in good condition.  Follow-up Plans & Appointments    Follow-up Information    Follow up with Eileen Stanford, PA-C On 03/29/2015.   Specialties:  Cardiology, Radiology   Why:  @ 9am    Contact information:   [place] [id number] [phone]      Discharge Instructions    AMB Referral to Cardiac Rehabilitation - Phase II    Complete by:  As directed   Diagnosis:  Myocardial Infarction     AMB Referral to Cardiac Rehabilitation - Phase II    Complete by:  As directed   Diagnosis:  Myocardial Infarction           Discharge Medications   Current Discharge Medication List    START taking these medications   Details  aspirin EC 81 MG EC tablet Take 1 tablet (81 mg total) by mouth daily.    atorvastatin (LIPITOR) 80 MG tablet Take 1 tablet (80 mg total) by mouth daily at 6 PM. Qty: 90 tablet, Refills: 3    carvedilol (COREG) 3.125 MG tablet Take 3 tablets (9.375 mg total) by mouth 2 (two) times daily with a meal. Qty: 540 tablet, Refills: 3    nitroGLYCERIN (NITROSTAT) 0.4 MG SL  tablet Place 1 tablet (0.4 mg total) under the tongue every 5 (five) minutes as needed for chest pain (CP or SOB). Qty: 25 tablet, Refills: 12    ticagrelor (BRILINTA) 90 MG TABS tablet Take 1 tablet (90 mg total) by mouth 2 (two) times daily. Qty: 180 tablet, Refills: 6      CONTINUE these medications which have NOT CHANGED   Details  Alogliptin-Pioglitazone (OSENI) 25-30 MG TABS Take 1 tablet by  mouth daily.    lisinopril (PRINIVIL,ZESTRIL) 10 MG tablet Take 10 mg by mouth daily.      STOP taking these medications     amLODipine (NORVASC) 10 MG tablet      pravastatin (PRAVACHOL) 10 MG tablet          Aspirin prescribed at discharge?  Yes High Intensity Statin Prescribed? (Lipitor 40-80mg  or Crestor 20-40mg ): Yes Beta Blocker Prescribed? Yes For EF 45% or less, Was ACEI/ARB Prescribed? No: n/a ADP Receptor Inhibitor Prescribed? (i.e. Plavix etc.-Includes Medically Managed Patients): Yes For EF <40%, Aldosterone Inhibitor Prescribed? No: n/a Was EF assessed during THIS hospitalization? Yes Was Cardiac Rehab II ordered? (Included Medically managed Patients): Yes      Outstanding Labs/Studies   BMET- ensure K has improved.   Duration of Discharge Encounter   Greater than 30 minutes including physician time.  SignedAngelena Form R PA-C 03/23/2015, 10:11 AM Patient seen and examined. I agree with the assessment and plan as detailed above. See also my additional thoughts below.   I made the decision for discharge. I agree with the discharge note as outlined above.  Dola Argyle, MD, [facility] 03/23/2015 11:26 AM

## 2015-03-23 NOTE — Progress Notes (Signed)
CARDIAC REHAB PHASE I   PRE:  Rate/Rhythm: 82 SR  BP:  Sitting: 128/78        SaO2: 99 RA  MODE:  Ambulation: 500 ft   POST:  Rate/Rhythm: 101 ST  BP:  Sitting: 126/86         SaO2: 99 RA  Pt ambulated 500 ft on RA, independent, steady gait, tolerated well.  Pt denies cp, dizziness, DOE, declined rest stop, states he doesn't need to walk anymore. Completed MI/stent education.  Reviewed risk factors, anti-platelet therapy, stent card (pt states he does not have his stent card from his procedure 2/7, RN notified), activity restrictions, ntg, exercise, heart healthy diet, carb counting, portion control, and phase 2 cardiac rehab. Pt verbalized understanding. Pt agrees to phase 2 cardiac rehab referral, will send to [facility]. Pt to edge of bed after walk, call bell within reach.   [id number] Lenna Sciara, RN, BSN 03/23/2015 9:58 AM

## 2015-03-23 NOTE — Progress Notes (Signed)
Patient Name: [name] Date of Encounter: 03/23/2015     Principal Problem:   NSTEMI (non-ST elevated myocardial infarction) [facility]) Active Problems:   Essential hypertension   Hyperlipidemia   Angina effort [facility])   Renal insufficiency    SUBJECTIVE  No chest pain or SOB. Ready to go home.   CURRENT MEDS . adenosine  16 mL Intravenous Once  . aspirin EC  81 mg Oral Daily  . atorvastatin  80 mg Oral q1800  . carvedilol  9.375 mg Oral BID WC  . insulin aspart  0-5 Units Subcutaneous QHS  . insulin aspart  0-9 Units Subcutaneous TID WC  . lisinopril  10 mg Oral Daily  . sodium chloride flush  3 mL Intravenous Q12H  . ticagrelor  90 mg Oral BID    OBJECTIVE  Filed Vitals:   03/22/15 1947 03/22/15 2000 03/23/15 0434 03/23/15 0725  BP: 110/70  111/71 103/57  Pulse: 96  87   Temp: 98.8 F (37.1 C)  97.6 F (36.4 C) 98.2 F (36.8 C)  TempSrc: Oral  Oral Oral  Resp: 28 17 20    Height:      Weight:   224 lb 13.9 oz (102 kg)   SpO2: 96%  97%     Intake/Output Summary (Last 24 hours) at 03/23/15 0819 Last data filed at 03/22/15 2310  Gross per 24 hour  Intake 1117.5 ml  Output    125 ml  Net  992.5 ml   Filed Weights   03/20/15 1624 03/22/15 0500 03/23/15 0434  Weight: 228 lb 13.4 oz (103.8 kg) 220 lb 14.4 oz (100.2 kg) 224 lb 13.9 oz (102 kg)    PHYSICAL EXAM  General: Pleasant, NAD. obese Neuro: Alert and oriented X 3. Moves all extremities spontaneously. Psych: Normal affect. HEENT:  Normal  Neck: Supple without bruits or JVD. Lungs:  Resp regular and unlabored, CTA. Heart: RRR no s3, s4, or murmurs. Abdomen: Soft, non-tender, non-distended, BS + x 4.  Extremities: No clubbing, cyanosis or edema. DP/PT/Radials 2+ and equal bilaterally.  Accessory Clinical Findings  CBC  Recent Labs  03/21/15 0345  WBC 9.9  HGB 12.4*  HCT 38.6*  MCV 84.5  PLT 99991111   Basic Metabolic Panel  Recent Labs  03/21/15 0345  NA 139  K 4.0  CL 102  CO2 27    GLUCOSE 160*  BUN 12  CREATININE 1.26*  CALCIUM 9.0    Cardiac Enzymes  Recent Labs  03/20/15 1730 03/20/15 2100 03/21/15 0345  TROPONINI 0.39* 1.66* 8.05*    TELE  NSR   Radiology/Studies  Dg Chest 2 View  03/15/2015  CLINICAL DATA:  Chest pain and shortness of breath today. Initial encounter. EXAM: CHEST  2 VIEW COMPARISON:  PA and lateral chest 01/22/2012. FINDINGS: The lungs are clear. Heart size is normal. No pneumothorax or pleural effusion. No focal bony abnormality. IMPRESSION: Negative chest. Electronically Signed   By: Inge Rise M.D.   On: 03/15/2015 19:19   Ct Angio Chest Pe W/cm &/or Wo Cm  03/16/2015  CLINICAL DATA:  Chest pain and shortness of breath for 10 days. EXAM: CT ANGIOGRAPHY CHEST WITH CONTRAST TECHNIQUE: Multidetector CT imaging of the chest was performed using the standard protocol during bolus administration of intravenous contrast. Multiplanar CT image reconstructions and MIPs were obtained to evaluate the vascular anatomy. CONTRAST:  171mL OMNIPAQUE IOHEXOL 350 MG/ML SOLN COMPARISON:  Radiographs 5 hours prior. FINDINGS: There are no filling defects within the pulmonary arteries  to suggest pulmonary embolus. Thoracic aorta normal in caliber with mild atherosclerosis. Scattered coronary artery calcifications. Heart upper limits of normal in size. No mediastinal or hilar adenopathy. No pleural or pericardial effusion. Heterogeneous diffuse enlargement of the right lobe of the thyroid gland without CT defined nodule. Scattered atelectasis and hypoventilatory change. There is subpleural fat in the left lung base. Evaluation of the upper abdomen demonstrates no acute abnormality. Liver appears enlarged with steatosis. There are no acute or suspicious osseous abnormalities. Review of the MIP images confirms the above findings. IMPRESSION: 1. No pulmonary embolus or acute intrathoracic process. 2. Coronary artery calcifications. 3. Incidental findings of  heterogeneous enlargement of the right thyroid gland, no CT defined nodule, suspect goiter. 4. Hepatic steatosis. Electronically Signed   By: Jeb Levering M.D.   On: 03/16/2015 00:35   Echocardiogram 03/18/2015  LV EF: 55% -  60% Study Conclusions - Left ventricle: The cavity size was normal. Wall thickness was increased in a pattern of mild LVH. Systolic function was normal. The estimated ejection fraction was in the range of 55% to 60%. Probable hypokinesis of the basalinferior myocardium. Doppler parameters are consistent with abnormal left ventricular relaxation (grade 1 diastolic dysfunction). - Aortic valve: Mildly calcified annulus. Trileaflet; mildly calcified leaflets. - Tricuspid valve: There was trivial regurgitation. - Pulmonary arteries: Systolic pressure could not be accurately estimated. - Pericardium, extracardiac: There was no pericardial effusion. Impressions: - Mild LVH with LVEF 55-60%, probable basal inferior hypokinesis. Grade 1 diastolic dysfunction with normal filling pressure. Mildly sclerotic aortic valve. Trivial tricuspid regurgitation.        LHC 03/19/15: Conclusion     Mid RCA lesion, 70% stenosed.  1st Mrg lesion, 95% stenosed.  Dist Cx lesion, 90% stenosed.  3rd Mrg lesion, 90% stenosed.  Prox LAD lesion, 80% stenosed.  Ost 1st Diag to 1st Diag lesion, 90% stenosed.  The left ventricular systolic function is normal.         ASSESSMENT AND PLAN  [name] is a 55 y.o. male with a history of obesity, HTN, T2DM, HLD, and tobacco abuse who presented to [facility] on [facility]/17/15 with chest pain and found to have NSTEMI.   NSTEMI:  -- He underwent LHC on 03/19/15 which revealed 3V CAD (70% mid RCA, 95% OM1, 90% distal LCx, 90% OM3, 80% prox LAD and 90% ost D1) with normal LV function. Initially it was felt be was best suited for CABG. Then after discussion with Dr. Martinique, it was decided to proceed with that a percutaneous  approach to the LCX OM and distal LCX (prob culprit vessel) with LAD FFR as the patient was young and had normal LV function.  -- Echo 03/18/2015 EF 55-60%, grade 1 DD. -- Cath 03/20/2015 75% mid LAD with significant FFR treated with 3.5x40mm long synergy, small distal dissection treated with 3x12 mm Synergy DES, small D1 was lost during procedure with onset of CP and EKG changes -- Post procedure, trop went from 0.39 up to 1.66 --> 8.05, likely representing infarct of diagonal territory as pt did not have any chest pain before procedure started therefore LCx artery being responsible for chest pain  -- Echo 03/21/15 EF 55-60% with no change from previous despite diag infarction during cath.  -- Cath 03/22/15  mRCA 60% sten, oRamus 95% sten s/p DES and dLCx 95% sten s/p DES  2. CAD: newly diagnosed, see #1 - continue ASA, lipitor, coreg, lisinopril and Brilinta.  3. HTN: BP well controlled on lisinopril 10mg  dail  and Coreg 9.375mg  BID. 4. HLD w/ hypertriglyceridemia: unable to calculate LDL as TG >450. Continue high dose statin (recent LFTs normal). 5. DM T2: uncontrolled: HgA1C 13.4. He will need to follow up with his PCP about this.  6. Obesity: diet and exercise counseled  7. Tobacco abuse: he is ready to quit    Signed, Eileen Stanford PA-C  Pager [phone] Patient seen and examined. I agree with the assessment and plan as detailed above. See also my additional thoughts below.   The patient is now improved after treatment of his coronary disease. I made the decision for him to be discharged home. I agree with noted above.  Dola Argyle, MD, [name] 03/23/2015 9:15 AM

## 2015-03-23 NOTE — Care Management Note (Signed)
Case Management Note  Patient Details  Name: [name] MRN: [id number] Date of Birth: [birth year]  Subjective/Objective:   Patient for dc today, has 30 day savings card and the commericial savings card, pta indep.                   Action/Plan:   Expected Discharge Date:  03/19/15               Expected Discharge Plan:  Home/Self Care  In-House Referral:     Discharge planning Services  CM Consult, Medication Assistance  Post Acute Care Choice:    Choice offered to:     DME Arranged:    DME Agency:     HH Arranged:    HH Agency:     Status of Service:  Completed, signed off  Medicare Important Message Given:    Date Medicare IM Given:    Medicare IM give by:    Date Additional Medicare IM Given:    Additional Medicare Important Message give by:     If discussed at [place] of Stay Meetings, dates discussed:    Additional Comments:  Zenon Mayo, RN 03/23/2015, 11:14 AM

## 2015-03-27 ENCOUNTER — Ambulatory Visit: Payer: BLUE CROSS/BLUE SHIELD | Admitting: Cardiovascular Disease

## 2015-03-28 ENCOUNTER — Encounter: Payer: Self-pay | Admitting: Cardiovascular Disease

## 2015-03-28 ENCOUNTER — Ambulatory Visit (INDEPENDENT_AMBULATORY_CARE_PROVIDER_SITE_OTHER): Payer: BLUE CROSS/BLUE SHIELD | Admitting: Cardiovascular Disease

## 2015-03-28 VITALS — BP 100/58 | HR 88 | Ht 67.0 in | Wt 225.0 lb

## 2015-03-28 DIAGNOSIS — E785 Hyperlipidemia, unspecified: Secondary | ICD-10-CM | POA: Diagnosis not present

## 2015-03-28 DIAGNOSIS — I2510 Atherosclerotic heart disease of native coronary artery without angina pectoris: Secondary | ICD-10-CM

## 2015-03-28 DIAGNOSIS — I10 Essential (primary) hypertension: Secondary | ICD-10-CM

## 2015-03-28 DIAGNOSIS — I2583 Coronary atherosclerosis due to lipid rich plaque: Secondary | ICD-10-CM

## 2015-03-28 NOTE — Progress Notes (Signed)
03/28/2015 [name]   1960/06/19  [id number]  Primary Physician No primary care provider on file. Primary Cardiologist: Lorretta Harp MD Renae Gloss   HPI:  Mr. 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 He was referred by [facility] emergency room for cardiovascular evaluation because of chest pressure and dyspnea. I initially saw him in the office 03/16/15.He has a history of tobacco abuse smoking one half pack per day for 15 years although he has discontinued this since his intervention.Marland Kitchen History of hypertension, hypokalemia and diabetes. There is no family history. He is complaining of chest pressure and dyspnea over the years worse over the last 10 days. I had initially planned on performing a stress test whenever he was admitted to the hospital several days after I saw him in the office with unstable angina. I performed cardiac catheterization on him regularly on 03/19/15 revealing three-vessel disease with preserved LV function. I initially contemplated recommending bypass surgery but ultimately decided to proceed with percutaneous revascularization given his young age. I stented his proximal LAD via the right femoral approach with excellent intravascular result using 2 overlapping stents but unfortunately lost his diagonal branch which resulted in chest pain and EKG changes during the procedure. Ultimately evolved a non-STEMI with a troponin of 8 however his 2-D echo revealed preserved LV function. Today's after that I performed stenting of his first obtuse marginal branch and distal left PLA radially. I performed FFR of his dominant RCA revealing a result of 0.8 suggesting that this was not physiologic with significant and therefore intervention was not performed. He was discharged on the following day. He's been a symptomatic since.  Current Outpatient Prescriptions  Medication  Sig Dispense Refill  . Alogliptin-Pioglitazone (OSENI) 25-30 MG TABS Take 1 tablet by mouth daily.    Marland Kitchen aspirin EC 81 MG EC tablet Take 1 tablet (81 mg total) by mouth daily.    Marland Kitchen atorvastatin (LIPITOR) 80 MG tablet Take 1 tablet (80 mg total) by mouth daily at 6 PM. 90 tablet 3  . carvedilol (COREG) 3.125 MG tablet Take 3 tablets (9.375 mg total) by mouth 2 (two) times daily with a meal. 540 tablet 3  . lisinopril (PRINIVIL,ZESTRIL) 10 MG tablet Take 10 mg by mouth daily.    . nitroGLYCERIN (NITROSTAT) 0.4 MG SL tablet Place 1 tablet (0.4 mg total) under the tongue every 5 (five) minutes as needed for chest pain (CP or SOB). 25 tablet 12  . ticagrelor (BRILINTA) 90 MG TABS tablet Take 1 tablet (90 mg total) by mouth 2 (two) times daily. 180 tablet 6   No current facility-administered medications for this visit.    Allergies  Allergen Reactions  . Aspirin Other (See Comments)    Upset stomach      Social History   Social History  . Marital Status: Married    Spouse Name: N/A  . Number of Children: N/A  . Years of Education: N/A   Occupational History  . Not on file.   Social History Main Topics  . Smoking status: Current Every Day Smoker -- 0.50 packs/day    Types: Cigarettes  . Smokeless tobacco: Not on file  . Alcohol Use: Yes     Comment: weekend  . Drug Use: Yes    Special: Marijuana  . Sexual Activity: Not on file   Other Topics Concern  . Not on file  Social History Narrative     Review of Systems: General: negative for chills, fever, night sweats or weight changes.  Cardiovascular: negative for chest pain, dyspnea on exertion, edema, orthopnea, palpitations, paroxysmal nocturnal dyspnea or shortness of breath Dermatological: negative for rash Respiratory: negative for cough or wheezing Urologic: negative for hematuria Abdominal: negative for nausea, vomiting, diarrhea, bright red blood per rectum, melena, or hematemesis Neurologic: negative for visual  changes, syncope, or dizziness All other systems reviewed and are otherwise negative except as noted above.    Blood pressure 100/58, pulse 88, height 5\' 7"  (1.702 m), weight 225 lb (102.059 kg).  General appearance: alert and no distress Neck: no adenopathy, no carotid bruit, no JVD, supple, symmetrical, trachea midline and thyroid not enlarged, symmetric, no tenderness/mass/nodules Lungs: clear to auscultation bilaterally Heart: regular rate and rhythm, S1, S2 normal, no murmur, click, rub or gallop Extremities: extremities normal, atraumatic, no cyanosis or edema  EKG not performed today  ASSESSMENT AND PLAN:   Essential hypertension History of hypertension with blood pressure measured today at 100/58. He is on carvedilol and lisinopril. Continue current meds at current dosing  Hyperlipidemia History of hyperlipidemia on atorvastatin 80 with recent lipid profile performed 03/18/15 revealed a total cholesterol of 227. History of triglyceride level was 450. His LDL was not calculated. was not calculated well. We will recheck in 2 months  CAD (coronary artery disease) History of CAD status post cardiac catheterization performed 03/19/15 revealing 3 vessel disease with normal LV function. Initially I had contemplated bypass surgery but ultimately decided to revascularize him percutaneously. I initially stented his proximal LAD and lost his first diagonal branch resulting in a mild non-STEMI although his EF was normal by 2-D echo the following day. His troponin did increase to 8. 2 days later I stented his first marginal branch and PLA branch off of the circumflex all with drug-eluting stents. I performed FFR of his RCA which did not meet the threshold necessary to perform intervention (0.82). Since discharge he is clinically improved. He denies chest pain or shortness of breath.      Lorretta Harp MD FACP,FACC,FAHA, [facility] 03/28/2015 3:44 PM

## 2015-03-28 NOTE — Assessment & Plan Note (Signed)
History of CAD status post cardiac catheterization performed 03/19/15 revealing 3 vessel disease with normal LV function. Initially I had contemplated bypass surgery but ultimately decided to revascularize him percutaneously. I initially stented his proximal LAD and lost his first diagonal branch resulting in a mild non-STEMI although his EF was normal by 2-D echo the following day. His troponin did increase to 8. 2 days later I stented his first marginal branch and PLA branch off of the circumflex all with drug-eluting stents. I performed FFR of his RCA which did not meet the threshold necessary to perform intervention (0.82). Since discharge he is clinically improved. He denies chest pain or shortness of breath.

## 2015-03-28 NOTE — Assessment & Plan Note (Signed)
History of hypertension with blood pressure measured today at 100/58. He is on carvedilol and lisinopril. Continue current meds at current dosing

## 2015-03-28 NOTE — Assessment & Plan Note (Signed)
History of hyperlipidemia on atorvastatin 80 with recent lipid profile performed 03/18/15 revealed a total cholesterol of 227. History of triglyceride level was 450. His LDL was not calculated. was not calculated well. We will recheck in 2 months

## 2015-03-28 NOTE — Patient Instructions (Signed)
Medication Instructions:  Your physician recommends that you continue on your current medications as directed. Please refer to the Current Medication list given to you today.   Labwork: Your physician recommends that you return for lab work in: 2 months - [place]  (lipid/liver) The lab can be found on the FIRST FLOOR of out building in [address]   Testing/Procedures: none  Follow-Up: Your physician wants you to follow-up in: 6 months with Dr. Gwenlyn Found. You will receive a reminder letter in the mail two months in advance. If you don't receive a letter, please call our office to schedule the follow-up appointment.   Any Other Special Instructions Will Be Listed Below (If Applicable).     If you need a refill on your cardiac medications before your next appointment, please call your pharmacy.

## 2015-03-29 ENCOUNTER — Encounter: Payer: BLUE CROSS/BLUE SHIELD | Admitting: Physician Assistant

## 2015-04-26 ENCOUNTER — Telehealth (HOSPITAL_COMMUNITY): Payer: Self-pay | Admitting: *Deleted

## 2015-04-26 NOTE — Telephone Encounter (Signed)
Message left for pt to please contact cardiac rehab for sign up.  Contact information provided. Cherre Huger, BSN

## 2015-05-12 ENCOUNTER — Telehealth: Payer: Self-pay | Admitting: Cardiology

## 2015-05-12 NOTE — Telephone Encounter (Signed)
Pt called asking if he could change Brilinta to Plavix secondary to SOB. I suggested he try a small dose of caffeine with the Brilinta and told him I would discuss changing to Plavix or Effient with Dr Gwenlyn Found but to continue Brilinta for now.  Kerin Ransom PA-C 05/12/2015 1:03 PM

## 2015-05-15 ENCOUNTER — Telehealth: Payer: Self-pay | Admitting: *Deleted

## 2015-05-15 MED ORDER — PRASUGREL HCL 10 MG PO TABS: 10.0000 mg | ORAL_TABLET | Freq: Every day | ORAL | Status: DC

## 2015-05-15 NOTE — Telephone Encounter (Signed)
Left message for pt, New script sent to the pharmacy. Pt is to call with questions or concerns.

## 2015-05-15 NOTE — Telephone Encounter (Signed)
-----   Message from Erlene Quan, [place] sent at 05/15/2015  8:31 AM EDT ----- Can you call this pt in Effient 10 mg daily x 12 months- (stop Brilinta- pt requests secondary to SOB). After 12 months of Effient he should then be switched to Plavix 75 mg daily indefinitely.   Thanks,  Kerin Ransom PA-C 05/15/2015 8:35 AM

## 2015-05-17 ENCOUNTER — Other Ambulatory Visit: Payer: Self-pay

## 2015-05-17 DIAGNOSIS — I10 Essential (primary) hypertension: Secondary | ICD-10-CM

## 2015-05-17 DIAGNOSIS — I2510 Atherosclerotic heart disease of native coronary artery without angina pectoris: Secondary | ICD-10-CM

## 2015-05-17 DIAGNOSIS — I2583 Coronary atherosclerosis due to lipid rich plaque: Secondary | ICD-10-CM

## 2015-05-17 DIAGNOSIS — E785 Hyperlipidemia, unspecified: Secondary | ICD-10-CM

## 2015-06-20 ENCOUNTER — Telehealth: Payer: Self-pay | Admitting: Cardiovascular Disease

## 2015-06-20 MED ORDER — LISINOPRIL 10 MG PO TABS: 10.0000 mg | ORAL_TABLET | Freq: Every day | ORAL | Status: DC

## 2015-06-20 NOTE — Telephone Encounter (Signed)
New message      Per pt is does he need to continue medication(Lisinopril), pt states he was on it will he was in the hospital. And he states no-more refills for Lisinopril. Pt wants to make sure if he needs to take and if so he needs a prescription form Lisinopril.   Per pt if needing to call in prescription pt uses [facility] on [address] if it needs to be called.

## 2015-06-20 NOTE — Telephone Encounter (Signed)
Spoke with patient who wanted to verify he was to continue taking lisinopril.  Informed patient this was on his hospital discharge summary from Feb and also noted in MD note from Feb and states he should continue Rx(s) sent to pharmacy electronically for lisinopril to patient's preferred pharmacy.

## 2015-09-24 ENCOUNTER — Other Ambulatory Visit: Payer: Self-pay

## 2016-05-02 ENCOUNTER — Telehealth: Payer: Self-pay | Admitting: *Deleted

## 2016-05-02 MED ORDER — CARVEDILOL 3.125 MG PO TABS: 9.3750 mg | ORAL_TABLET | Freq: Two times a day (BID) | ORAL | 0 refills | Status: DC

## 2016-05-02 NOTE — Telephone Encounter (Signed)
LEFT MESSAGE TO CALL BACK-    PATIENT NEEDS AN APPOINTMENT DR BERRY FOR PRESCRIPTION TO BE FILLED-   ONE MONTH REFILL GIVEN-

## 2016-06-22 ENCOUNTER — Other Ambulatory Visit: Payer: Self-pay | Admitting: Cardiovascular Disease

## 2016-08-24 ENCOUNTER — Other Ambulatory Visit: Payer: Self-pay | Admitting: Cardiovascular Disease

## 2016-08-25 NOTE — Telephone Encounter (Signed)
REFILL 

## 2017-03-14 ENCOUNTER — Other Ambulatory Visit: Payer: Self-pay | Admitting: Cardiovascular Disease

## 2017-03-16 NOTE — Telephone Encounter (Signed)
Rx request sent to pharmacy.  

## 2017-05-28 ENCOUNTER — Other Ambulatory Visit: Payer: Self-pay | Admitting: Cardiovascular Disease

## 2017-10-09 ENCOUNTER — Telehealth: Payer: Self-pay

## 2017-10-09 NOTE — Telephone Encounter (Signed)
Notes on file, referral sent to scheduling.

## 2017-11-12 ENCOUNTER — Ambulatory Visit: Payer: BLUE CROSS/BLUE SHIELD | Admitting: Registered"

## 2018-03-19 DIAGNOSIS — I252 Old myocardial infarction: Secondary | ICD-10-CM | POA: Insufficient documentation

## 2019-08-25 ENCOUNTER — Emergency Department (HOSPITAL_COMMUNITY): Payer: Self-pay

## 2019-08-25 ENCOUNTER — Other Ambulatory Visit: Payer: Self-pay

## 2019-08-25 DIAGNOSIS — I119 Hypertensive heart disease without heart failure: Secondary | ICD-10-CM | POA: Insufficient documentation

## 2019-08-25 DIAGNOSIS — Z7982 Long term (current) use of aspirin: Secondary | ICD-10-CM | POA: Insufficient documentation

## 2019-08-25 DIAGNOSIS — I10 Essential (primary) hypertension: Secondary | ICD-10-CM | POA: Insufficient documentation

## 2019-08-25 DIAGNOSIS — E876 Hypokalemia: Secondary | ICD-10-CM | POA: Insufficient documentation

## 2019-08-25 DIAGNOSIS — I2510 Atherosclerotic heart disease of native coronary artery without angina pectoris: Secondary | ICD-10-CM | POA: Insufficient documentation

## 2019-08-25 DIAGNOSIS — R002 Palpitations: Secondary | ICD-10-CM | POA: Insufficient documentation

## 2019-08-25 DIAGNOSIS — E119 Type 2 diabetes mellitus without complications: Secondary | ICD-10-CM | POA: Insufficient documentation

## 2019-08-25 DIAGNOSIS — F17210 Nicotine dependence, cigarettes, uncomplicated: Secondary | ICD-10-CM | POA: Insufficient documentation

## 2019-08-25 DIAGNOSIS — R079 Chest pain, unspecified: Secondary | ICD-10-CM | POA: Insufficient documentation

## 2019-08-25 DIAGNOSIS — Z79899 Other long term (current) drug therapy: Secondary | ICD-10-CM | POA: Insufficient documentation

## 2019-08-25 DIAGNOSIS — Z7984 Long term (current) use of oral hypoglycemic drugs: Secondary | ICD-10-CM | POA: Insufficient documentation

## 2019-08-25 LAB — BASIC METABOLIC PANEL
Anion gap: 16 — ABNORMAL HIGH (ref 5–15)
BUN: 24 mg/dL — ABNORMAL HIGH (ref 6–20)
CO2: 31 mmol/L (ref 22–32)
Calcium: 9.3 mg/dL (ref 8.9–10.3)
Chloride: 93 mmol/L — ABNORMAL LOW (ref 98–111)
Creatinine, Ser: 1.36 mg/dL — ABNORMAL HIGH (ref 0.61–1.24)
GFR calc Af Amer: >60 mL/min (ref >60)
GFR calc non Af Amer: 57 mL/min — ABNORMAL LOW (ref >60)
Glucose, Bld: 118 mg/dL — ABNORMAL HIGH (ref 70–99)
Potassium: 3.3 mmol/L — ABNORMAL LOW (ref 3.5–5.1)
Sodium: 140 mmol/L (ref 135–145)

## 2019-08-25 LAB — CBC
HCT: 47.8 % (ref 39.0–52.0)
Hemoglobin: 15.8 g/dL (ref 13.0–17.0)
MCH: 31.2 pg (ref 26.0–34.0)
MCHC: 33.1 g/dL (ref 30.0–36.0)
MCV: 94.5 fL (ref 80.0–100.0)
Platelets: 236 10*3/uL (ref 150–400)
RBC: 5.06 MIL/uL (ref 4.22–5.81)
RDW: 13.3 % (ref 11.5–15.5)
WBC: 8.2 10*3/uL (ref 4.0–10.5)
nRBC: 0 % (ref 0.0–0.2)

## 2019-08-25 LAB — TROPONIN I (HIGH SENSITIVITY): Troponin I (High Sensitivity): 15 ng/L (ref <18)

## 2019-08-25 IMAGING — CR DG CHEST 2V
2 series · 2 of 2 positions shown · non-contrast
Comparison: [DATE]

CLINICAL DATA: Chest pain

EXAM:
CHEST - 2 VIEW

[w chest pa]
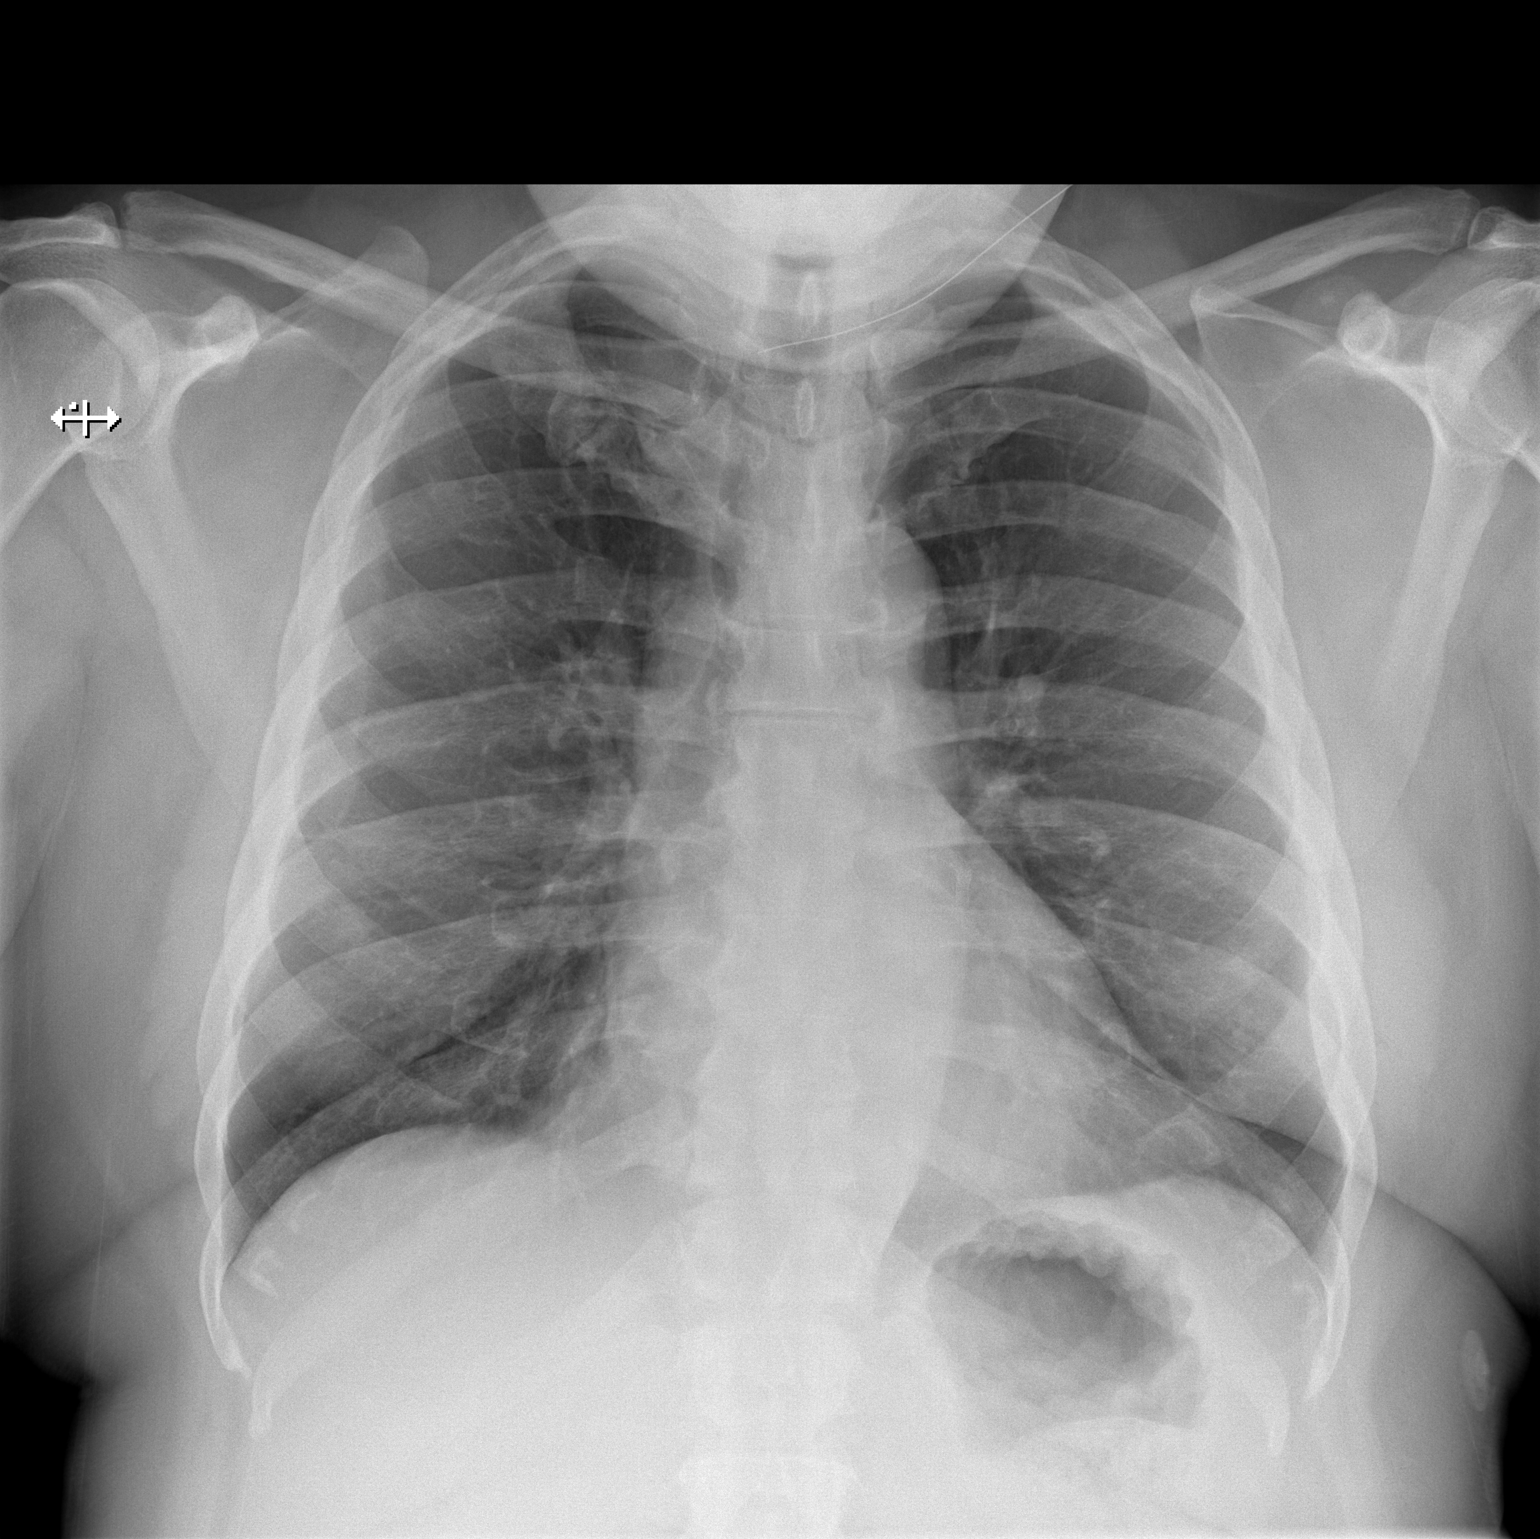

[w chest lat]
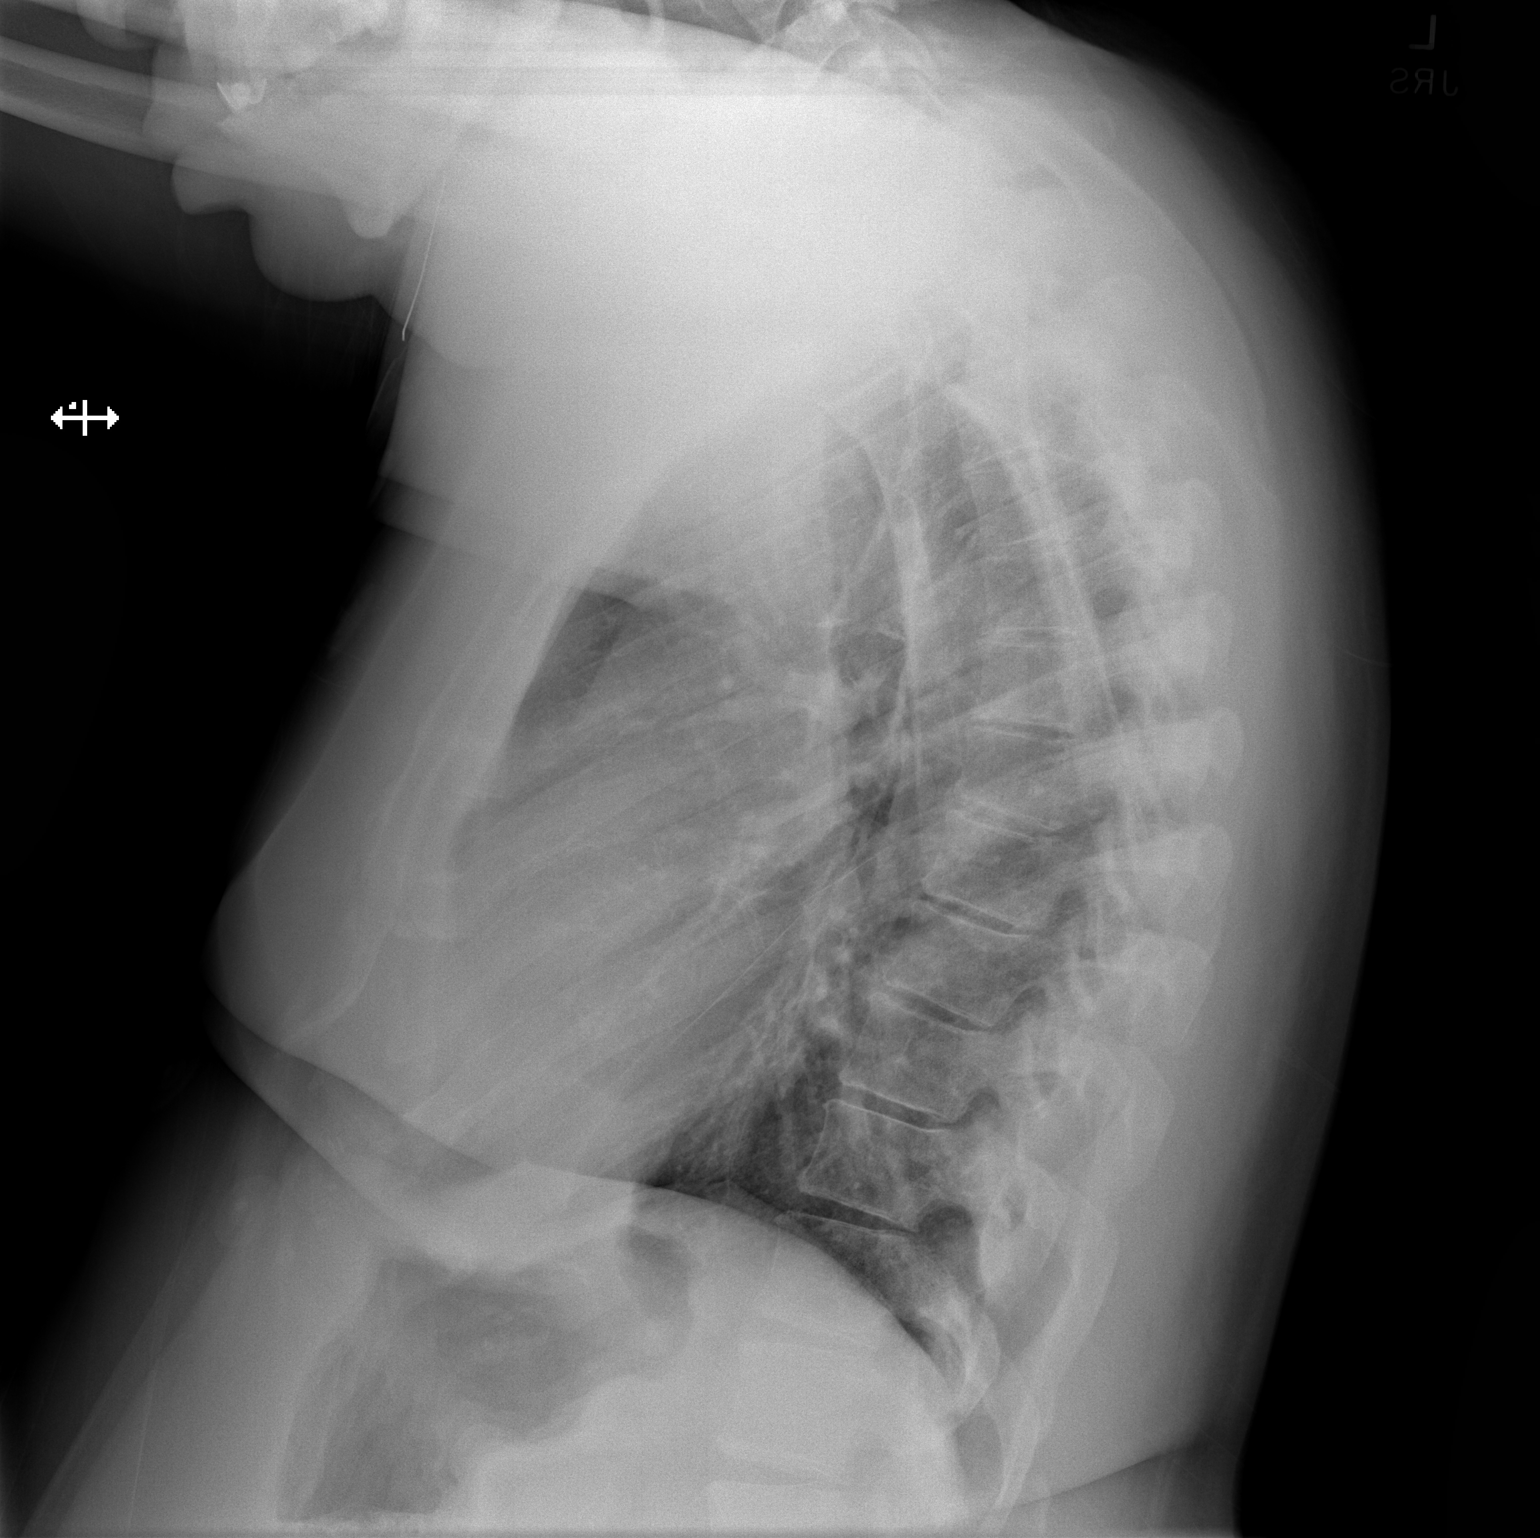

[2 of 2 positions shown; findings below may reference images not displayed]

FINDINGS: The heart size and mediastinal contours are within normal limits.
Both lungs are clear. The visualized skeletal structures are
unremarkable.
IMPRESSION: No active cardiopulmonary disease.

## 2019-08-25 MED ORDER — SODIUM CHLORIDE 0.9% FLUSH: 3.0000 mL | Freq: Once | INTRAVENOUS | Status: DC

## 2019-08-25 NOTE — ED Triage Notes (Signed)
Patient reports to the ER for chest pain with a hx of heart attack. Patient denies n/v/d. Patient reports some SOB.

## 2019-08-26 ENCOUNTER — Emergency Department (HOSPITAL_COMMUNITY)
Admission: EM | Admit: 2019-08-26 | Discharge: 2019-08-26 | Disposition: A | Discharge status: Home / Self Care | Payer: Self-pay | Attending: Emergency Medicine | Admitting: Emergency Medicine

## 2019-08-26 DIAGNOSIS — R002 Palpitations: Secondary | ICD-10-CM

## 2019-08-26 DIAGNOSIS — I10 Essential (primary) hypertension: Secondary | ICD-10-CM

## 2019-08-26 DIAGNOSIS — R079 Chest pain, unspecified: Secondary | ICD-10-CM

## 2019-08-26 DIAGNOSIS — E876 Hypokalemia: Secondary | ICD-10-CM

## 2019-08-26 LAB — TROPONIN I (HIGH SENSITIVITY): Troponin I (High Sensitivity): 12 ng/L (ref <18)

## 2019-08-26 LAB — MAGNESIUM: Magnesium: 2.1 mg/dL (ref 1.7–2.4)

## 2019-08-26 MED ORDER — POTASSIUM CHLORIDE CRYS ER 20 MEQ PO TBCR
40.0000 meq | EXTENDED_RELEASE_TABLET | Freq: Once | ORAL | Status: AC
  Administered 2019-08-26: 40 meq via ORAL
  Filled 2019-08-26: qty 2

## 2019-08-26 NOTE — ED Notes (Signed)
ED Provider at bedside. 

## 2019-08-26 NOTE — ED Provider Notes (Signed)
[facility] DEPT Provider Note   CSN: [id number] Arrival date & time: 08/25/19  8546     History Chief Complaint  Patient presents with  . Chest Pain    [name] is a 59 y.o. male.  He has a history of an NSTEMI and has three-vessel coronary disease and had cardiac stenting.  Diabetic.  Complaining of 1 week of feeling his heart skip a beat associated with some brief chest pressure and feeling like he needs to take a deep breath.  He said it was happening only once a day but over the past few days has been happening multiple times a day.  This is different than when he had his cardiac event which was severe shortness of breath that lasted hours.  He has been losing weight and working out over the past few months and feels none of this discomfort with exertion.  No fevers chills cough abdominal pain nausea vomiting diarrhea.  Vapes and drinks alcohol denies any stimulants or drugs.  Has not followed up with cardiology in a while due to loss of insurance.  The history is provided by the patient.  Palpitations Palpitations quality:  Fast Onset quality:  Gradual Duration:  1 week Timing:  Sporadic Progression:  Worsening Chronicity:  New Context: nicotine   Context: not illicit drugs and not stimulant use   Relieved by:  Nothing Worsened by:  Nothing Ineffective treatments:  None tried Associated symptoms: no back pain, no chest pain, no cough, no diaphoresis, no dizziness, no hemoptysis, no lower extremity edema, no nausea, no shortness of breath, no syncope and no vomiting   Risk factors: diabetes mellitus and heart disease   Risk factors: no OTC sinus medications        Past Medical History:  Diagnosis Date  . CAD (coronary artery disease)    a. 03/2015 NSTEMI: LHC with severe 3V CAD  (70% mid RCA, 95% OM1, 90% distal LCx, 90% OM3, 80% prox LAD and 90% ost D1) s/p DES to mLAD w/ small dissction Rx with DES, staged ost Ramus PCI/DES and dLCx s/p  PCI/DES   . Diabetes mellitus type 2 in obese ([place])   . Diverticulosis   . Hypercholesteremia   . Hypertension   . Obesity   . Tobacco abuse     Patient Active Problem List   Diagnosis Date Noted  . CAD (coronary artery disease)   . Diabetes mellitus type 2 in obese ([place])   . Hypercholesteremia   . Tobacco abuse   . Obesity   . NSTEMI (non-ST elevated myocardial infarction) ([place]) 03/17/2015  . Essential hypertension 03/16/2015  . Hyperlipidemia 03/16/2015  . Dyspnea on exertion 03/16/2015  . Diverticulosis     Past Surgical History:  Procedure Laterality Date  . CARDIAC CATHETERIZATION N/A 03/19/2015   Procedure: Left Heart Cath and Coronary Angiography;  Surgeon: Lorretta Harp, MD;  Location: [facility];  Service: Cardiovascular;  Laterality: N/A;  . CARDIAC CATHETERIZATION N/A 03/20/2015   Procedure: Coronary Stent Intervention;  Surgeon: Lorretta Harp, MD;  Location: [facility];  Service: Cardiovascular;  Laterality: N/A;  . CARDIAC CATHETERIZATION N/A 03/22/2015   Procedure: Coronary Stent Intervention;  Surgeon: Lorretta Harp, MD;  Location: [facility];  Service: Cardiovascular;  Laterality: N/A;       No family history on file.  Social History   Tobacco Use  . Smoking status: Current Every Day Smoker    Packs/day: 0.50  Types: Cigarettes  Substance Use Topics  . Alcohol use: Yes    Comment: weekend  . Drug use: Yes    Types: Marijuana    Home Medications Prior to Admission medications   Medication Sig Start Date End Date Taking? Authorizing Provider  Alogliptin-Pioglitazone (OSENI) 25-30 MG TABS Take 1 tablet by mouth daily.    [provider]  aspirin EC 81 MG EC tablet Take 1 tablet (81 mg total) by mouth daily. 03/23/15   Eileen Stanford, PA-C  atorvastatin (LIPITOR) 80 MG tablet Take 1 tablet (80 mg total) by mouth daily at 6 PM. 03/23/15   Eileen Stanford, PA-C  carvedilol (COREG) 3.125 MG tablet TAKE 1  TABLET BY MOUTH TWICE DAILY WITH A MEAL 05/28/17   Lorretta Harp, MD  lisinopril (PRINIVIL,ZESTRIL) 10 MG tablet Take 1 tablet (10 mg total) by mouth daily. 06/20/15   Lorretta Harp, MD  nitroGLYCERIN (NITROSTAT) 0.4 MG SL tablet Place 1 tablet (0.4 mg total) under the tongue every 5 (five) minutes as needed for chest pain (CP or SOB). 03/23/15   Eileen Stanford, PA-C  prasugrel (EFFIENT) 10 MG TABS tablet Take 1 tablet (10 mg total) by mouth daily. 05/15/15   Erlene Quan, PA-C    Allergies    Aspirin  Review of Systems   Review of Systems  Constitutional: Negative for diaphoresis and fever.  HENT: Negative for sore throat.   Eyes: Negative for visual disturbance.  Respiratory: Negative for cough, hemoptysis and shortness of breath.   Cardiovascular: Positive for palpitations. Negative for chest pain and syncope.  Gastrointestinal: Negative for abdominal pain, nausea and vomiting.  Genitourinary: Negative for dysuria.  Musculoskeletal: Negative for back pain.  Skin: Negative for rash.  Neurological: Negative for dizziness.    Physical Exam Updated Vital Signs BP (!) 169/99 (BP Location: Left Arm)   Pulse 98   Temp 98.3 F (36.8 C) (Oral)   Resp 18   SpO2 97%   Physical Exam Vitals and nursing note reviewed.  Constitutional:      Appearance: Normal appearance. He is well-developed.  HENT:     Head: Normocephalic and atraumatic.  Eyes:     Conjunctiva/sclera: Conjunctivae normal.  Cardiovascular:     Rate and Rhythm: Normal rate and regular rhythm.     Pulses: Normal pulses.     Heart sounds: No murmur heard.   Pulmonary:     Effort: Pulmonary effort is normal. No respiratory distress.     Breath sounds: Normal breath sounds.  Abdominal:     Palpations: Abdomen is soft.     Tenderness: There is no abdominal tenderness.  Musculoskeletal:        General: Normal range of motion.     Cervical back: Neck supple.     Right lower leg: No edema.     Left lower  leg: No edema.  Skin:    General: Skin is warm and dry.     Capillary Refill: Capillary refill takes less than 2 seconds.  Neurological:     General: No focal deficit present.     Mental Status: He is alert.     ED Results / Procedures / Treatments   Labs (all labs ordered are listed, but only abnormal results are displayed) Labs Reviewed  BASIC METABOLIC PANEL - Abnormal; Notable for the following components:      Result Value   Potassium 3.3 (*)    Chloride 93 (*)    Glucose, Bld  118 (*)    BUN 24 (*)    Creatinine, Ser 1.36 (*)    GFR calc non Af Amer 57 (*)    Anion gap 16 (*)    All other components within normal limits  CBC  MAGNESIUM  TROPONIN I (HIGH SENSITIVITY)  TROPONIN I (HIGH SENSITIVITY)    EKG EKG Interpretation  Date/Time:  Thursday August 25 2019 18:59:22 EDT Ventricular Rate:  100 PR Interval:    QRS Duration: 95 QT Interval:  348 QTC Calculation: 449 R Axis:   35 Text Interpretation: Sinus tachycardia LAE, consider biatrial enlargement Abnormal T, consider ischemia, diffuse leads No significant change since prior 2/17 Confirmed by Aletta Edouard [phone]) on 08/26/2019 7:18:47 AM   Radiology DG Chest 2 View  Result Date: 08/25/2019 CLINICAL DATA:  Chest pain EXAM: CHEST - 2 VIEW COMPARISON:  03/15/2015 FINDINGS: The heart size and mediastinal contours are within normal limits. Both lungs are clear. The visualized skeletal structures are unremarkable. IMPRESSION: No active cardiopulmonary disease. Electronically Signed   By: Franchot Gallo M.D.   On: 08/25/2019 19:33    Procedures Procedures (including critical care time)  Medications Ordered in ED Medications  potassium chloride SA (KLOR-CON) CR tablet 40 mEq (40 mEq Oral Given 08/26/19 3500)    ED Course  I have reviewed the triage vital signs and the nursing notes.  Pertinent labs & imaging results that were available during my care of the patient were reviewed by me and considered in my  medical decision making (see chart for details).  Clinical Course as of Aug 26 1754  Fri Aug 26, 2019  0915 Repeat EKG showing some borderline tachycardia with some frequent PVCs.  Likely what he is experiencing in his chest.  Have repleted his potassium.  Return instructions discussed.   [MB]    Clinical Course User Index [MB] Hayden Rasmussen, MD   MDM Rules/Calculators/A&P                         This patient complains of palpitations with intermittent tachycardia chest pressure; this involves an extensive number of treatment Options and is a complaint that carries with it a high risk of complications and Morbidity. The differential includes arrhythmia, SVT, A. fib, ACS, pneumonia, PE, metabolic derangement  I ordered, reviewed and interpreted labs, which included CBC with normal white count normal hemoglobin, chemistries with mildly low potassium of 3.3, elevated creatinine of 1.36, elevated glucose.  Magnesium normal, delta troponin minimally changed I ordered medication oral potassium to supplement his low potassium I ordered imaging studies which included chest x-ray and I independently    visualized and interpreted imaging which showed no acute infiltrates Previous records obtained and reviewed in epic, no recent visits  After the interventions stated above, I reevaluated the patient and found patient's palpitations have resolved.  Blood pressure creeping up has not taken his morning medicines.  Recommended cardiology follow-up.  Return instructions discussed.   Final Clinical Impression(s) / ED Diagnoses Final diagnoses:  Palpitations  Nonspecific chest pain  Hypokalemia  Essential hypertension    Rx / DC Orders ED Discharge Orders    None       Hayden Rasmussen, MD 08/26/19 1759

## 2019-08-26 NOTE — Discharge Instructions (Addendum)
You were seen in the emergency department for increasing extra beats in your heart along with some chest discomfort.  You had blood work EKG and a chest x-ray.  There is no evidence of any heart injury.  Your potassium was slightly low and this was repleted.  Please try to stay well-hydrated.  Follow-up with cardiology as needed and return to the emergency department if any worsening or concerning symptoms.  Your blood pressure was also elevated here and you should continue your exercise routine and weight loss.  Please continue to monitor your blood pressure.

## 2019-09-29 ENCOUNTER — Other Ambulatory Visit: Payer: Self-pay | Admitting: Internal Medicine

## 2019-09-29 DIAGNOSIS — N63 Unspecified lump in unspecified breast: Secondary | ICD-10-CM

## 2019-10-12 ENCOUNTER — Ambulatory Visit
Admission: RE | Admit: 2019-10-12 | Discharge: 2019-10-12 | Disposition: A | Discharge status: Home / Self Care | Payer: Self-pay | Source: Ambulatory Visit | Attending: Internal Medicine | Admitting: Internal Medicine

## 2019-10-12 ENCOUNTER — Other Ambulatory Visit: Payer: Self-pay

## 2019-10-12 ENCOUNTER — Ambulatory Visit
Admission: RE | Admit: 2019-10-12 | Discharge: 2019-10-12 | Disposition: A | Discharge status: Home / Self Care | Payer: No Typology Code available for payment source | Source: Ambulatory Visit | Attending: Internal Medicine | Admitting: Internal Medicine

## 2019-10-12 DIAGNOSIS — N63 Unspecified lump in unspecified breast: Secondary | ICD-10-CM

## 2019-10-12 IMAGING — MG DIGITAL DIAGNOSTIC BILAT W/ TOMO W/ CAD
8 of 14 series · 9 of 40 positions shown · non-contrast
Comparison: None.

CLINICAL DATA: Patient presents for palpable mass within the medial
left breast.

EXAM:
DIGITAL DIAGNOSTIC BILATERAL MAMMOGRAM WITH CAD AND TOMO
ULTRASOUND LEFT BREAST

[L CC synth-2D (1 of 2)]
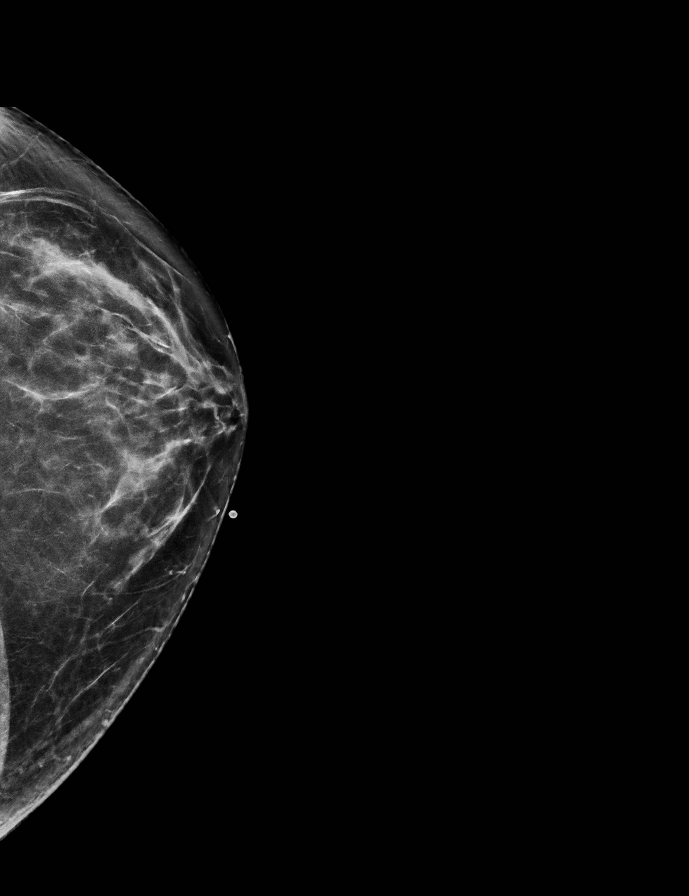

[R MLO synth-2D]
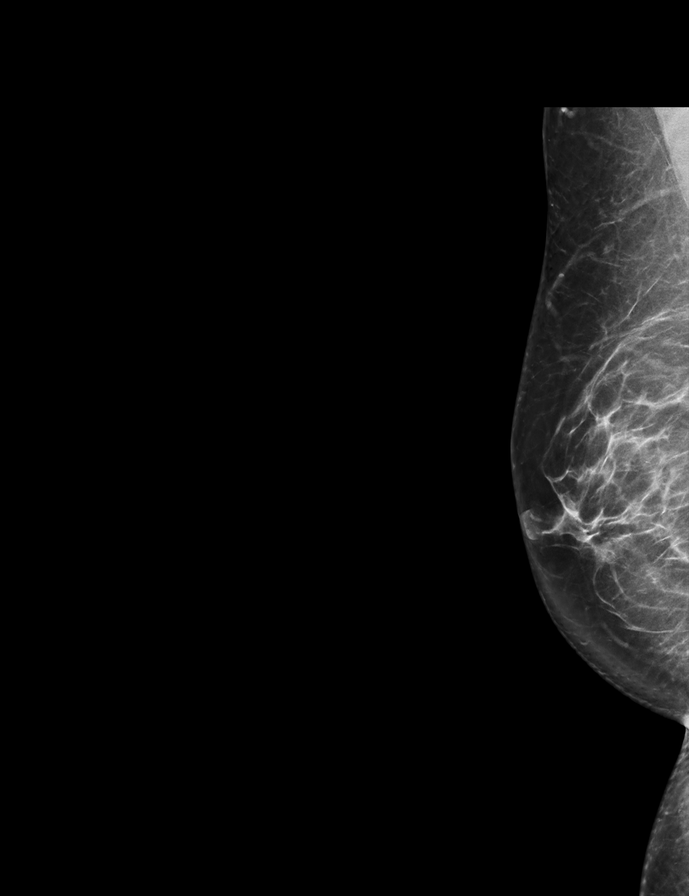

[L CC synth-2D (2 of 2)]
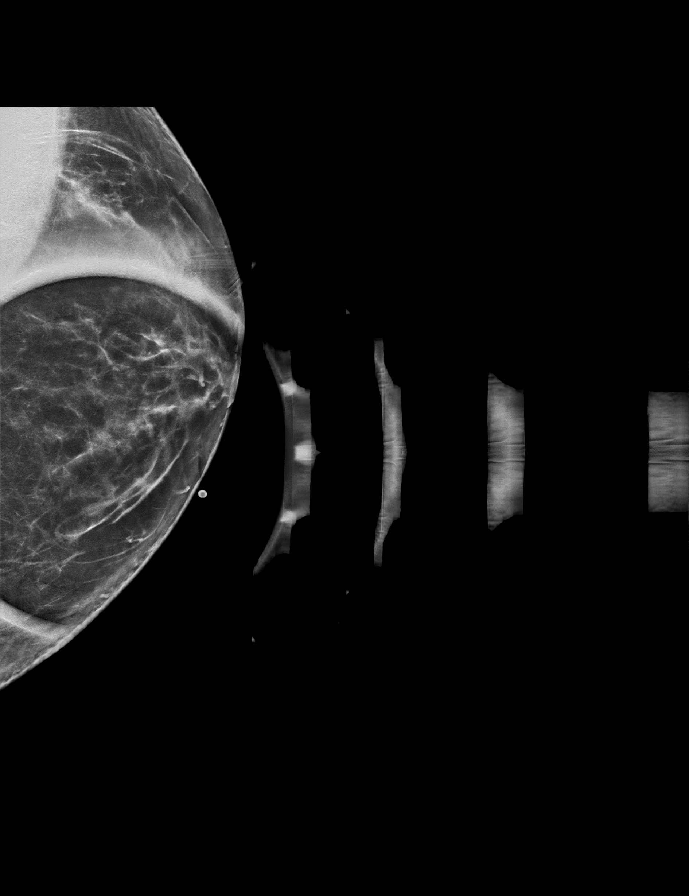

[L MLO synth-2D (1 of 2)]
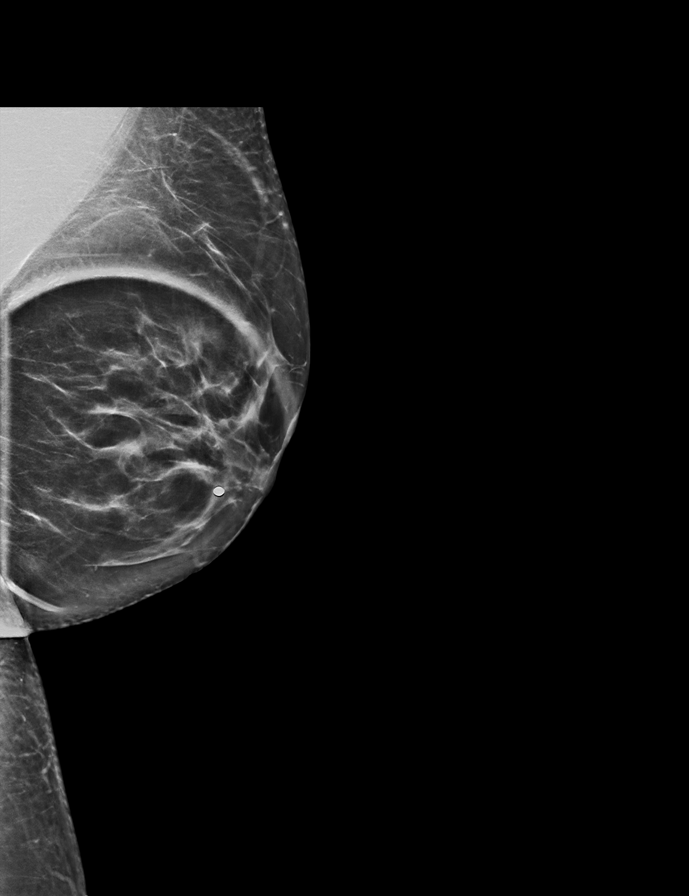

[L MLO synth-2D (2 of 2)]
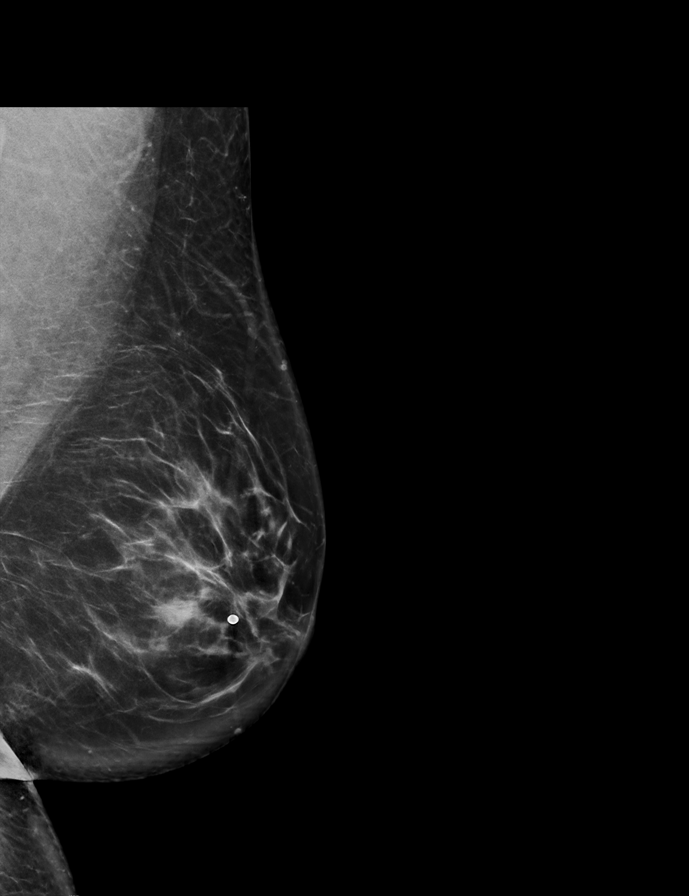

[L ML synth-2D]
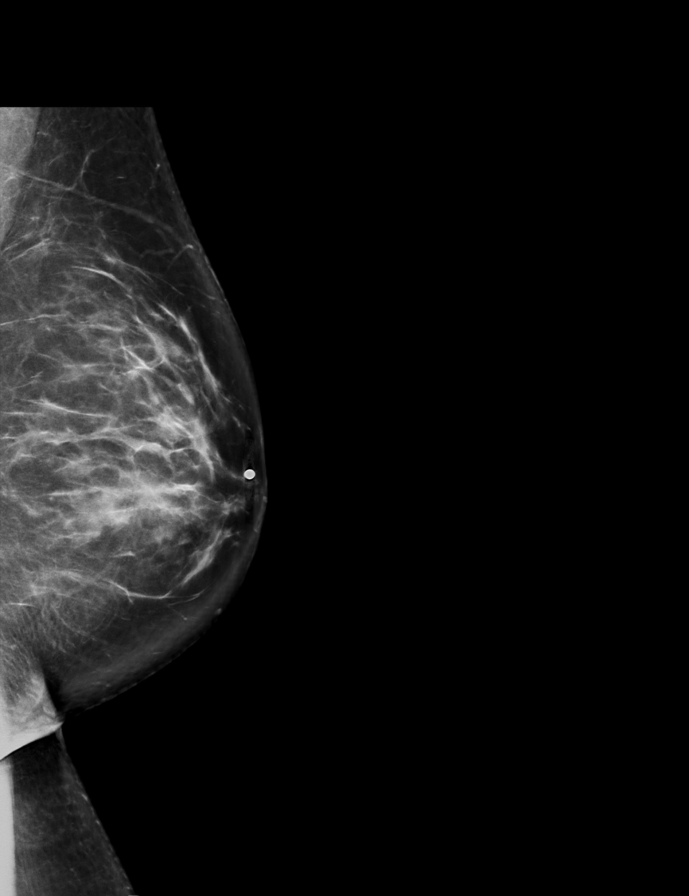

[R CC synth-2D]
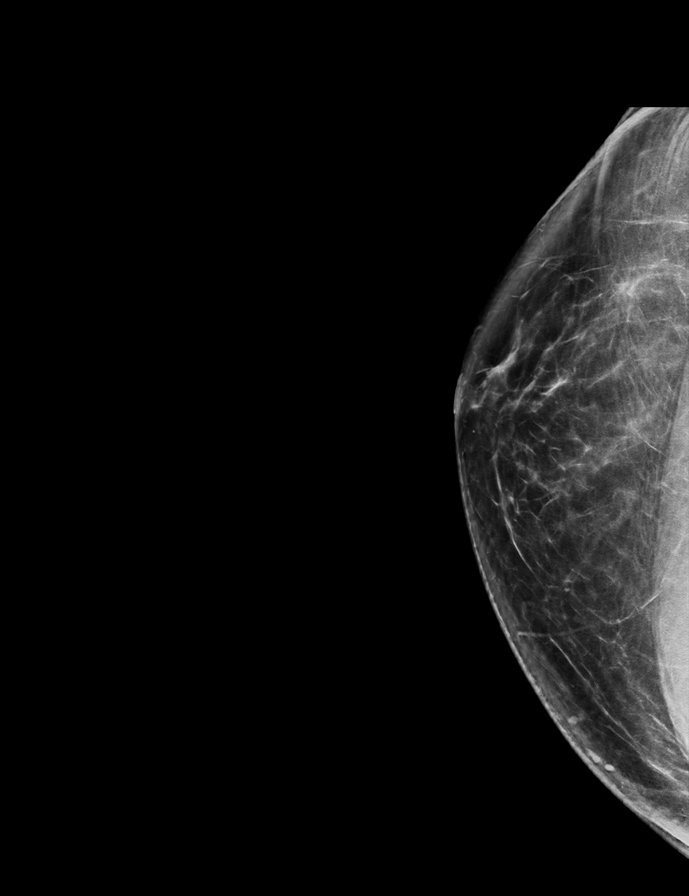

[L MLO tomo · 2 of 77 frames shown]
[frame 25/77]
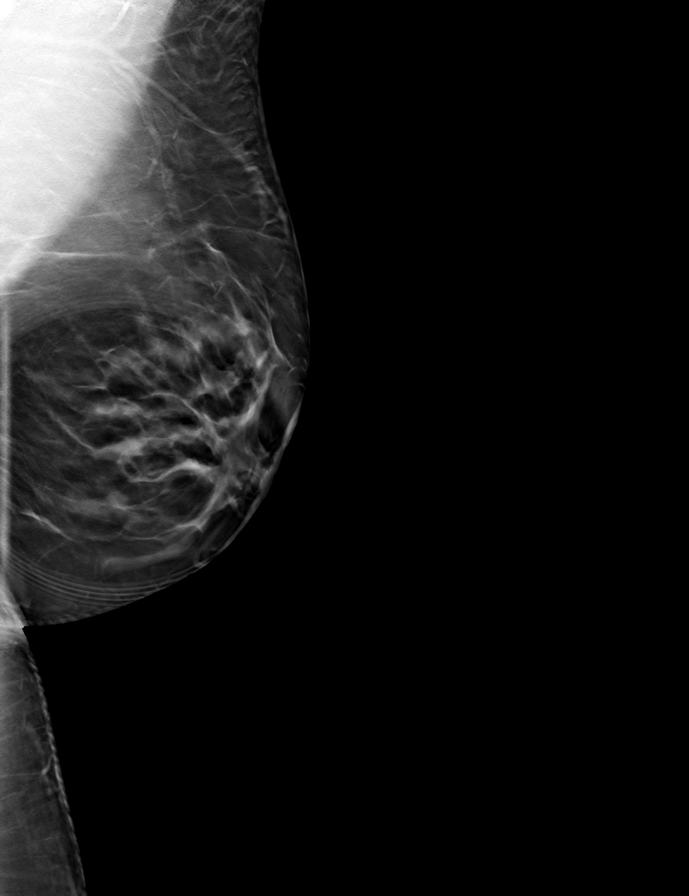
[frame 39/77]
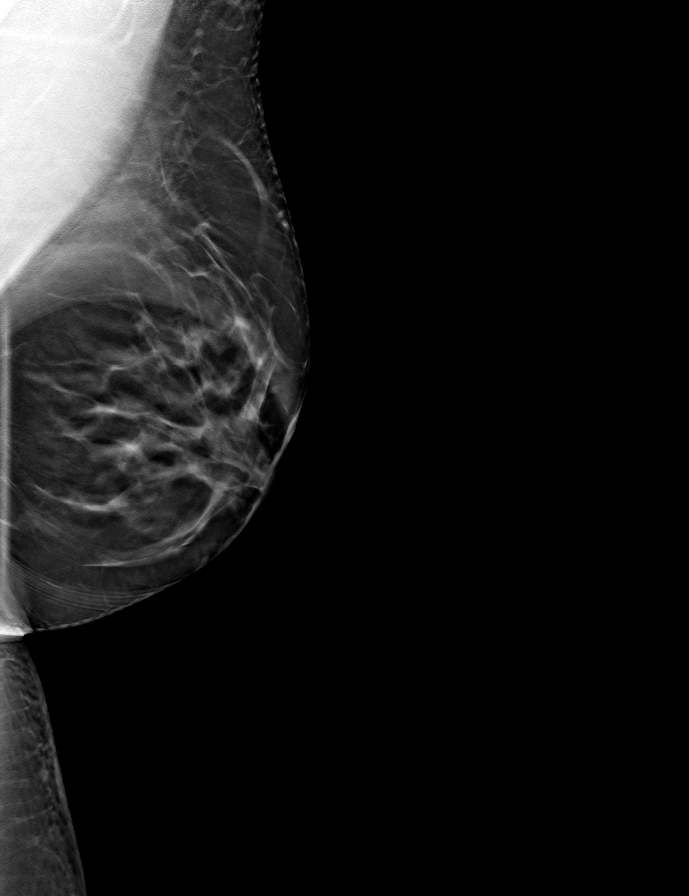

[9 of 40 positions shown; findings below may reference images not displayed]

ACR Breast Density Category b: There are scattered areas of
fibroglandular density.
FINDINGS: Bilateral left greater than right retroareolar flame shaped
densities compatible with gynecomastia. No definite suspicious mass
identified within the left breast at the site of palpable concern.

Mammographic images were processed with CAD.

On physical exam, dense tissue is palpated medial left breast.

Targeted ultrasound is performed, showing normal tissue without
suspicious mass within the medial left breast.
IMPRESSION: No mammographic evidence for malignancy.

RECOMMENDATION:
Continued clinical evaluation palpable abnormality left breast.

I have discussed the findings and recommendations with the patient.
If applicable, a reminder letter will be sent to the patient
regarding the next appointment.

BI-RADS CATEGORY  2: Benign.

## 2019-10-12 IMAGING — US US BREAST*L* LIMITED INC AXILLA
1 series · 10 of 10 positions shown · non-contrast
Comparison: None.

CLINICAL DATA: Patient presents for palpable mass within the medial
left breast.

EXAM:
DIGITAL DIAGNOSTIC BILATERAL MAMMOGRAM WITH CAD AND TOMO
ULTRASOUND LEFT BREAST

[Series 1: us breast*left* limited inc axilla · 0.09mm/px · 10 of 10 slices shown]
[im 1/10]
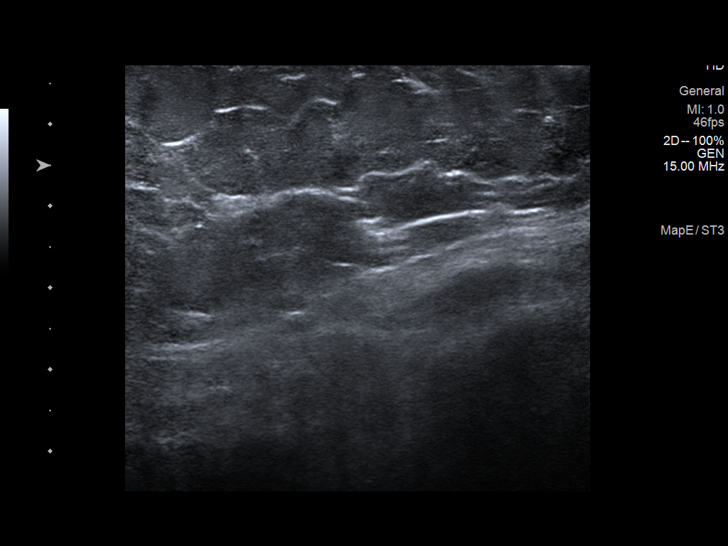
[im 2/10]
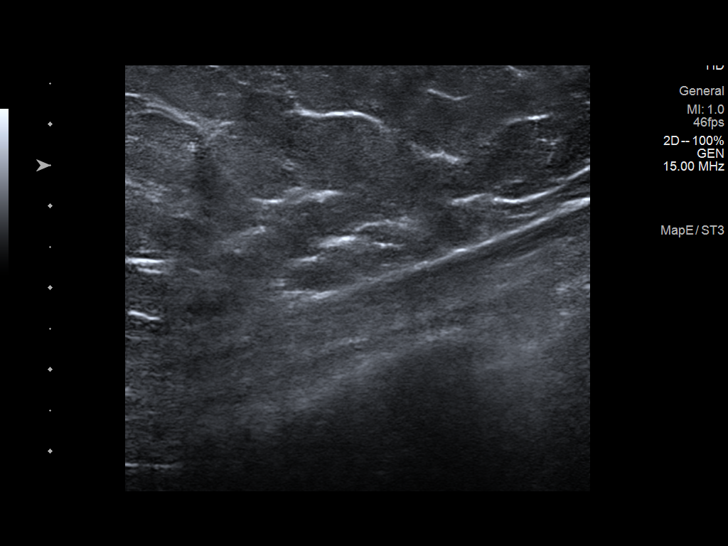
[im 3/10]
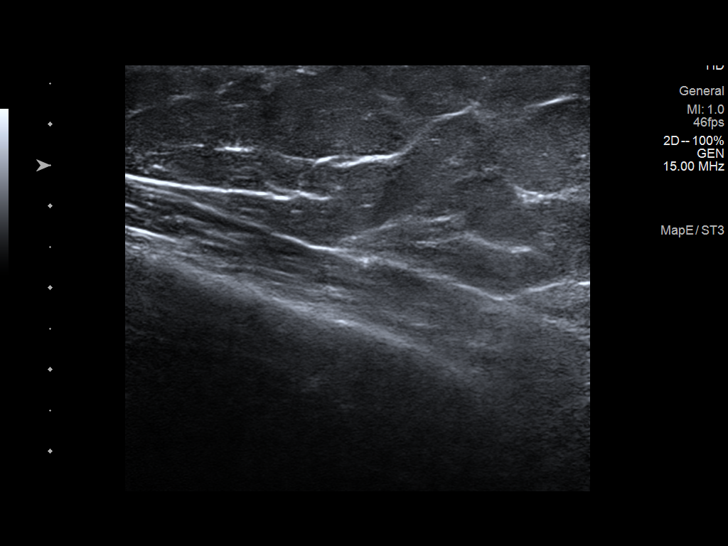
[im 4/10]
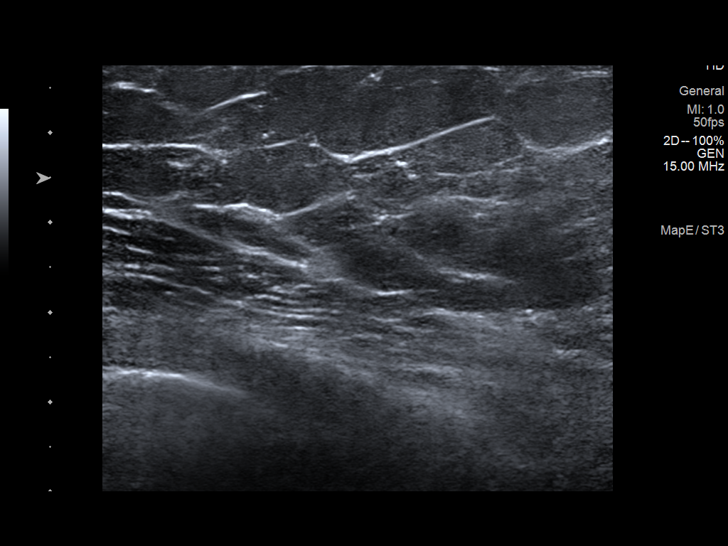
[im 5/10]
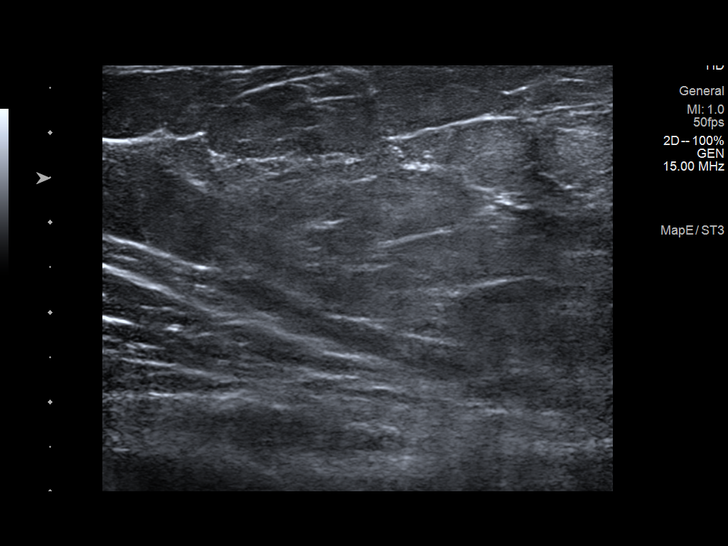
[im 6/10]
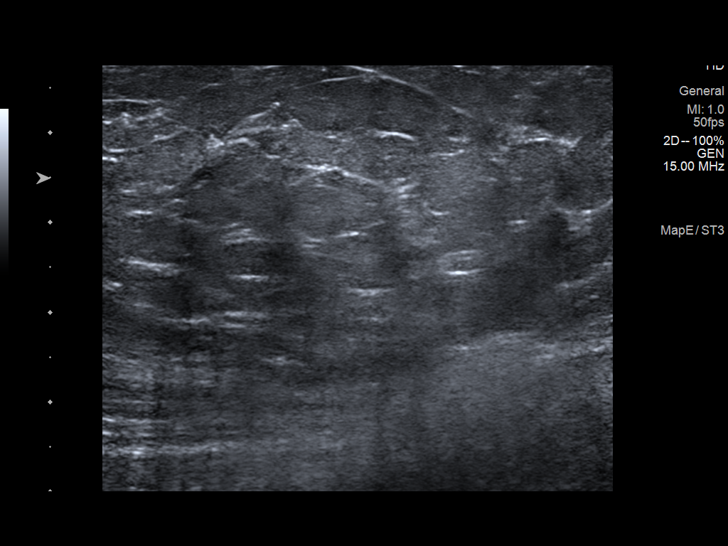
[im 7/10]
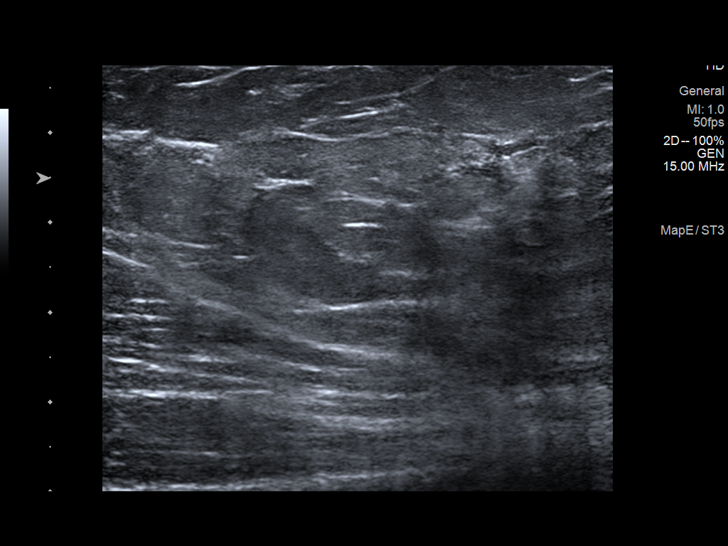
[im 8/10]
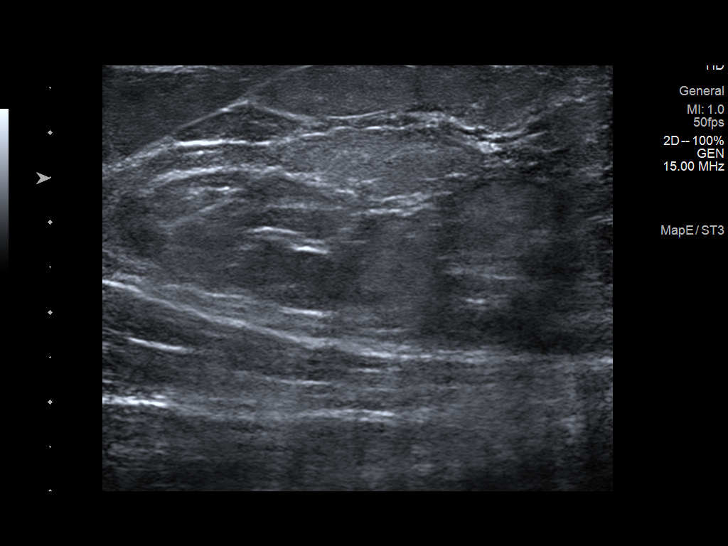
[im 9/10]
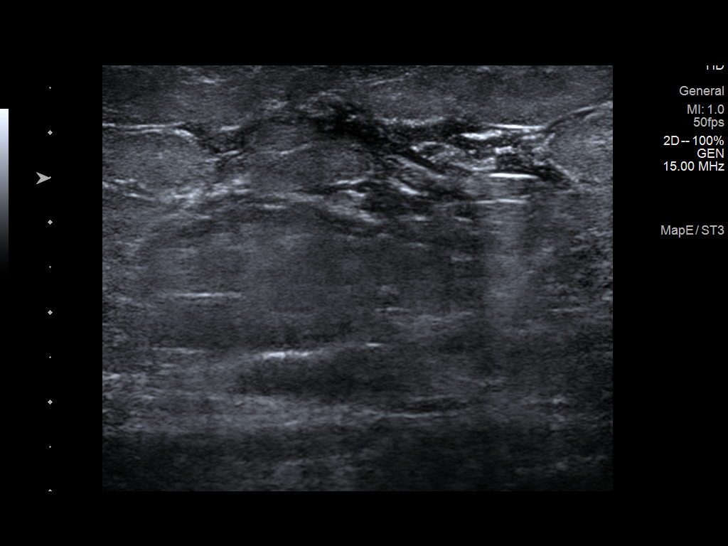
[im 10/10]
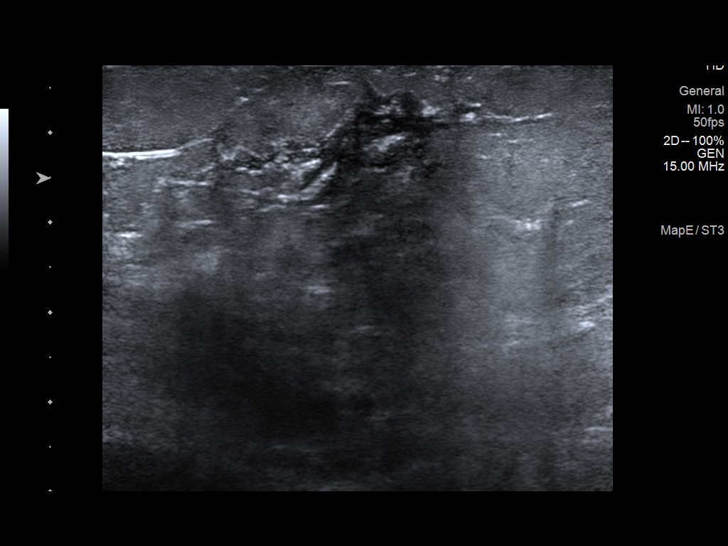

[10 of 10 positions shown; findings below may reference images not displayed]

ACR Breast Density Category b: There are scattered areas of
fibroglandular density.
FINDINGS: Bilateral left greater than right retroareolar flame shaped
densities compatible with gynecomastia. No definite suspicious mass
identified within the left breast at the site of palpable concern.

Mammographic images were processed with CAD.

On physical exam, dense tissue is palpated medial left breast.

Targeted ultrasound is performed, showing normal tissue without
suspicious mass within the medial left breast.
IMPRESSION: No mammographic evidence for malignancy.

RECOMMENDATION:
Continued clinical evaluation palpable abnormality left breast.

I have discussed the findings and recommendations with the patient.
If applicable, a reminder letter will be sent to the patient
regarding the next appointment.

BI-RADS CATEGORY  2: Benign.

## 2020-02-11 DIAGNOSIS — I639 Cerebral infarction, unspecified: Secondary | ICD-10-CM

## 2020-02-11 HISTORY — DX: Cerebral infarction, unspecified: I63.9

## 2020-06-19 DIAGNOSIS — Z0000 Encounter for general adult medical examination without abnormal findings: Secondary | ICD-10-CM | POA: Diagnosis not present

## 2020-06-19 DIAGNOSIS — I10 Essential (primary) hypertension: Secondary | ICD-10-CM | POA: Diagnosis not present

## 2020-06-19 DIAGNOSIS — E1165 Type 2 diabetes mellitus with hyperglycemia: Secondary | ICD-10-CM | POA: Diagnosis not present

## 2020-12-24 ENCOUNTER — Other Ambulatory Visit: Payer: Self-pay

## 2020-12-24 ENCOUNTER — Emergency Department (HOSPITAL_COMMUNITY): Payer: BC Managed Care – PPO

## 2020-12-24 ENCOUNTER — Encounter (HOSPITAL_COMMUNITY): Payer: Self-pay

## 2020-12-24 ENCOUNTER — Inpatient Hospital Stay (HOSPITAL_COMMUNITY): Payer: BC Managed Care – PPO

## 2020-12-24 ENCOUNTER — Inpatient Hospital Stay (HOSPITAL_COMMUNITY)
Admission: EM | Admit: 2020-12-24 | Discharge: 2021-01-01 | DRG: 280 | Disposition: A | Discharge status: Home / Self Care | Payer: BC Managed Care – PPO | Attending: Internal Medicine | Admitting: Internal Medicine

## 2020-12-24 DIAGNOSIS — I700 Atherosclerosis of aorta: Secondary | ICD-10-CM | POA: Diagnosis not present

## 2020-12-24 DIAGNOSIS — I5032 Chronic diastolic (congestive) heart failure: Secondary | ICD-10-CM | POA: Diagnosis not present

## 2020-12-24 DIAGNOSIS — R001 Bradycardia, unspecified: Secondary | ICD-10-CM | POA: Diagnosis present

## 2020-12-24 DIAGNOSIS — I214 Non-ST elevation (NSTEMI) myocardial infarction: Secondary | ICD-10-CM

## 2020-12-24 DIAGNOSIS — Z9114 Patient's other noncompliance with medication regimen: Secondary | ICD-10-CM

## 2020-12-24 DIAGNOSIS — E1122 Type 2 diabetes mellitus with diabetic chronic kidney disease: Secondary | ICD-10-CM | POA: Diagnosis not present

## 2020-12-24 DIAGNOSIS — I663 Occlusion and stenosis of cerebellar arteries: Secondary | ICD-10-CM | POA: Diagnosis not present

## 2020-12-24 DIAGNOSIS — I130 Hypertensive heart and chronic kidney disease with heart failure and stage 1 through stage 4 chronic kidney disease, or unspecified chronic kidney disease: Secondary | ICD-10-CM | POA: Diagnosis present

## 2020-12-24 DIAGNOSIS — R079 Chest pain, unspecified: Secondary | ICD-10-CM | POA: Diagnosis not present

## 2020-12-24 DIAGNOSIS — F17210 Nicotine dependence, cigarettes, uncomplicated: Secondary | ICD-10-CM | POA: Diagnosis present

## 2020-12-24 DIAGNOSIS — I10 Essential (primary) hypertension: Secondary | ICD-10-CM

## 2020-12-24 DIAGNOSIS — I160 Hypertensive urgency: Secondary | ICD-10-CM | POA: Diagnosis not present

## 2020-12-24 DIAGNOSIS — R4789 Other speech disturbances: Secondary | ICD-10-CM | POA: Diagnosis not present

## 2020-12-24 DIAGNOSIS — E7800 Pure hypercholesterolemia, unspecified: Secondary | ICD-10-CM | POA: Diagnosis present

## 2020-12-24 DIAGNOSIS — J90 Pleural effusion, not elsewhere classified: Secondary | ICD-10-CM | POA: Diagnosis not present

## 2020-12-24 DIAGNOSIS — I25118 Atherosclerotic heart disease of native coronary artery with other forms of angina pectoris: Secondary | ICD-10-CM

## 2020-12-24 DIAGNOSIS — F1010 Alcohol abuse, uncomplicated: Secondary | ICD-10-CM | POA: Diagnosis present

## 2020-12-24 DIAGNOSIS — I6340 Cerebral infarction due to embolism of unspecified cerebral artery: Secondary | ICD-10-CM | POA: Insufficient documentation

## 2020-12-24 DIAGNOSIS — E669 Obesity, unspecified: Secondary | ICD-10-CM | POA: Diagnosis not present

## 2020-12-24 DIAGNOSIS — I252 Old myocardial infarction: Secondary | ICD-10-CM | POA: Diagnosis not present

## 2020-12-24 DIAGNOSIS — G4730 Sleep apnea, unspecified: Secondary | ICD-10-CM | POA: Diagnosis present

## 2020-12-24 DIAGNOSIS — I25110 Atherosclerotic heart disease of native coronary artery with unstable angina pectoris: Secondary | ICD-10-CM | POA: Diagnosis not present

## 2020-12-24 DIAGNOSIS — I6389 Other cerebral infarction: Secondary | ICD-10-CM | POA: Diagnosis not present

## 2020-12-24 DIAGNOSIS — Z79899 Other long term (current) drug therapy: Secondary | ICD-10-CM

## 2020-12-24 DIAGNOSIS — N189 Chronic kidney disease, unspecified: Secondary | ICD-10-CM | POA: Diagnosis not present

## 2020-12-24 DIAGNOSIS — I25119 Atherosclerotic heart disease of native coronary artery with unspecified angina pectoris: Secondary | ICD-10-CM | POA: Diagnosis not present

## 2020-12-24 DIAGNOSIS — E1169 Type 2 diabetes mellitus with other specified complication: Secondary | ICD-10-CM

## 2020-12-24 DIAGNOSIS — N179 Acute kidney failure, unspecified: Secondary | ICD-10-CM

## 2020-12-24 DIAGNOSIS — I672 Cerebral atherosclerosis: Secondary | ICD-10-CM | POA: Diagnosis not present

## 2020-12-24 DIAGNOSIS — I2510 Atherosclerotic heart disease of native coronary artery without angina pectoris: Secondary | ICD-10-CM | POA: Diagnosis not present

## 2020-12-24 DIAGNOSIS — I6310 Cerebral infarction due to embolism of unspecified precerebral artery: Secondary | ICD-10-CM | POA: Diagnosis not present

## 2020-12-24 DIAGNOSIS — I63441 Cerebral infarction due to embolism of right cerebellar artery: Secondary | ICD-10-CM | POA: Diagnosis not present

## 2020-12-24 DIAGNOSIS — R29818 Other symptoms and signs involving the nervous system: Secondary | ICD-10-CM | POA: Diagnosis not present

## 2020-12-24 DIAGNOSIS — Z01810 Encounter for preprocedural cardiovascular examination: Secondary | ICD-10-CM | POA: Diagnosis not present

## 2020-12-24 DIAGNOSIS — R29700 NIHSS score 0: Secondary | ICD-10-CM | POA: Diagnosis not present

## 2020-12-24 DIAGNOSIS — Z20822 Contact with and (suspected) exposure to covid-19: Secondary | ICD-10-CM | POA: Diagnosis present

## 2020-12-24 DIAGNOSIS — I651 Occlusion and stenosis of basilar artery: Secondary | ICD-10-CM | POA: Diagnosis not present

## 2020-12-24 DIAGNOSIS — Z6835 Body mass index (BMI) 35.0-35.9, adult: Secondary | ICD-10-CM

## 2020-12-24 DIAGNOSIS — Z01818 Encounter for other preprocedural examination: Secondary | ICD-10-CM | POA: Diagnosis not present

## 2020-12-24 DIAGNOSIS — I639 Cerebral infarction, unspecified: Secondary | ICD-10-CM | POA: Diagnosis not present

## 2020-12-24 DIAGNOSIS — E042 Nontoxic multinodular goiter: Secondary | ICD-10-CM | POA: Diagnosis present

## 2020-12-24 DIAGNOSIS — Z8679 Personal history of other diseases of the circulatory system: Secondary | ICD-10-CM | POA: Insufficient documentation

## 2020-12-24 HISTORY — DX: Chest pain, unspecified: R07.9

## 2020-12-24 LAB — ECHOCARDIOGRAM COMPLETE
Area-P 1/2: 2.34 cm2
Calc EF: 56.3 %
Height: 67.5 in
S' Lateral: 3.6 cm
Single Plane A2C EF: 59.6 %
Single Plane A4C EF: 51.2 %
Weight: 3584.9 oz

## 2020-12-24 LAB — RAPID URINE DRUG SCREEN, HOSP PERFORMED
Amphetamines: NOT DETECTED
Barbiturates: NOT DETECTED
Benzodiazepines: NOT DETECTED
Cocaine: NOT DETECTED
Opiates: NOT DETECTED
Tetrahydrocannabinol: NOT DETECTED

## 2020-12-24 LAB — GLUCOSE, CAPILLARY: Glucose-Capillary: 291 mg/dL — ABNORMAL HIGH (ref 70–99)

## 2020-12-24 LAB — CBC WITH DIFFERENTIAL/PLATELET
Abs Immature Granulocytes: 0.12 10*3/uL — ABNORMAL HIGH (ref 0.00–0.07)
Basophils Absolute: 0.1 10*3/uL (ref 0.0–0.1)
Basophils Relative: 1 %
Eosinophils Absolute: 0.3 10*3/uL (ref 0.0–0.5)
Eosinophils Relative: 3 %
HCT: 46.4 % (ref 39.0–52.0)
Hemoglobin: 16.2 g/dL (ref 13.0–17.0)
Immature Granulocytes: 1 %
Lymphocytes Relative: 34 %
Lymphs Abs: 3.6 10*3/uL (ref 0.7–4.0)
MCH: 31.7 pg (ref 26.0–34.0)
MCHC: 34.9 g/dL (ref 30.0–36.0)
MCV: 90.8 fL (ref 80.0–100.0)
Monocytes Absolute: 1 10*3/uL (ref 0.1–1.0)
Monocytes Relative: 9 %
Neutro Abs: 5.7 10*3/uL (ref 1.7–7.7)
Neutrophils Relative %: 52 %
Platelets: 242 10*3/uL (ref 150–400)
RBC: 5.11 MIL/uL (ref 4.22–5.81)
RDW: 12 % (ref 11.5–15.5)
WBC: 10.8 10*3/uL — ABNORMAL HIGH (ref 4.0–10.5)
nRBC: 0 % (ref 0.0–0.2)

## 2020-12-24 LAB — COMPREHENSIVE METABOLIC PANEL
ALT: 42 U/L (ref 0–44)
AST: 32 U/L (ref 15–41)
Albumin: 3.9 g/dL (ref 3.5–5.0)
Alkaline Phosphatase: 131 U/L — ABNORMAL HIGH (ref 38–126)
Anion gap: 9 (ref 5–15)
BUN: 17 mg/dL (ref 6–20)
CO2: 27 mmol/L (ref 22–32)
Calcium: 9.3 mg/dL (ref 8.9–10.3)
Chloride: 96 mmol/L — ABNORMAL LOW (ref 98–111)
Creatinine, Ser: 1.56 mg/dL — ABNORMAL HIGH (ref 0.61–1.24)
GFR, Estimated: 51 mL/min — ABNORMAL LOW (ref >60)
Glucose, Bld: 432 mg/dL — ABNORMAL HIGH (ref 70–99)
Potassium: 4 mmol/L (ref 3.5–5.1)
Sodium: 132 mmol/L — ABNORMAL LOW (ref 135–145)
Total Bilirubin: 0.5 mg/dL (ref 0.3–1.2)
Total Protein: 7.4 g/dL (ref 6.5–8.1)

## 2020-12-24 LAB — URINALYSIS, ROUTINE W REFLEX MICROSCOPIC
Bacteria, UA: NONE SEEN
Bilirubin Urine: NEGATIVE
Glucose, UA: >=500 mg/dL — AB
Ketones, ur: NEGATIVE mg/dL
Leukocytes,Ua: NEGATIVE
Nitrite: NEGATIVE
Protein, ur: 100 mg/dL — AB
Specific Gravity, Urine: 1.02 (ref 1.005–1.030)
pH: 7 (ref 5.0–8.0)

## 2020-12-24 LAB — HEMOGLOBIN A1C
Hgb A1c MFr Bld: 10.5 % — ABNORMAL HIGH (ref 4.8–5.6)
Mean Plasma Glucose: 254.65 mg/dL

## 2020-12-24 LAB — TROPONIN I (HIGH SENSITIVITY)
Troponin I (High Sensitivity): 158 ng/L (ref <18)
Troponin I (High Sensitivity): 171 ng/L (ref <18)
Troponin I (High Sensitivity): 215 ng/L (ref <18)
Troponin I (High Sensitivity): 233 ng/L (ref <18)

## 2020-12-24 LAB — CBG MONITORING, ED: Glucose-Capillary: 281 mg/dL — ABNORMAL HIGH (ref 70–99)

## 2020-12-24 LAB — RESP PANEL BY RT-PCR (FLU A&B, COVID) ARPGX2
Influenza A by PCR: NEGATIVE
Influenza B by PCR: NEGATIVE
SARS Coronavirus 2 by RT PCR: NEGATIVE

## 2020-12-24 LAB — HEPARIN LEVEL (UNFRACTIONATED): Heparin Unfractionated: 0.18 IU/mL — ABNORMAL LOW (ref 0.30–0.70)

## 2020-12-24 LAB — BRAIN NATRIURETIC PEPTIDE: B Natriuretic Peptide: 412 pg/mL — ABNORMAL HIGH (ref 0.0–100.0)

## 2020-12-24 IMAGING — CR DG CHEST 2V
2 series · 2 of 2 positions shown · non-contrast
Comparison: [DATE]

CLINICAL DATA: Chest pain

EXAM:
CHEST - 2 VIEW

[w chest pa]
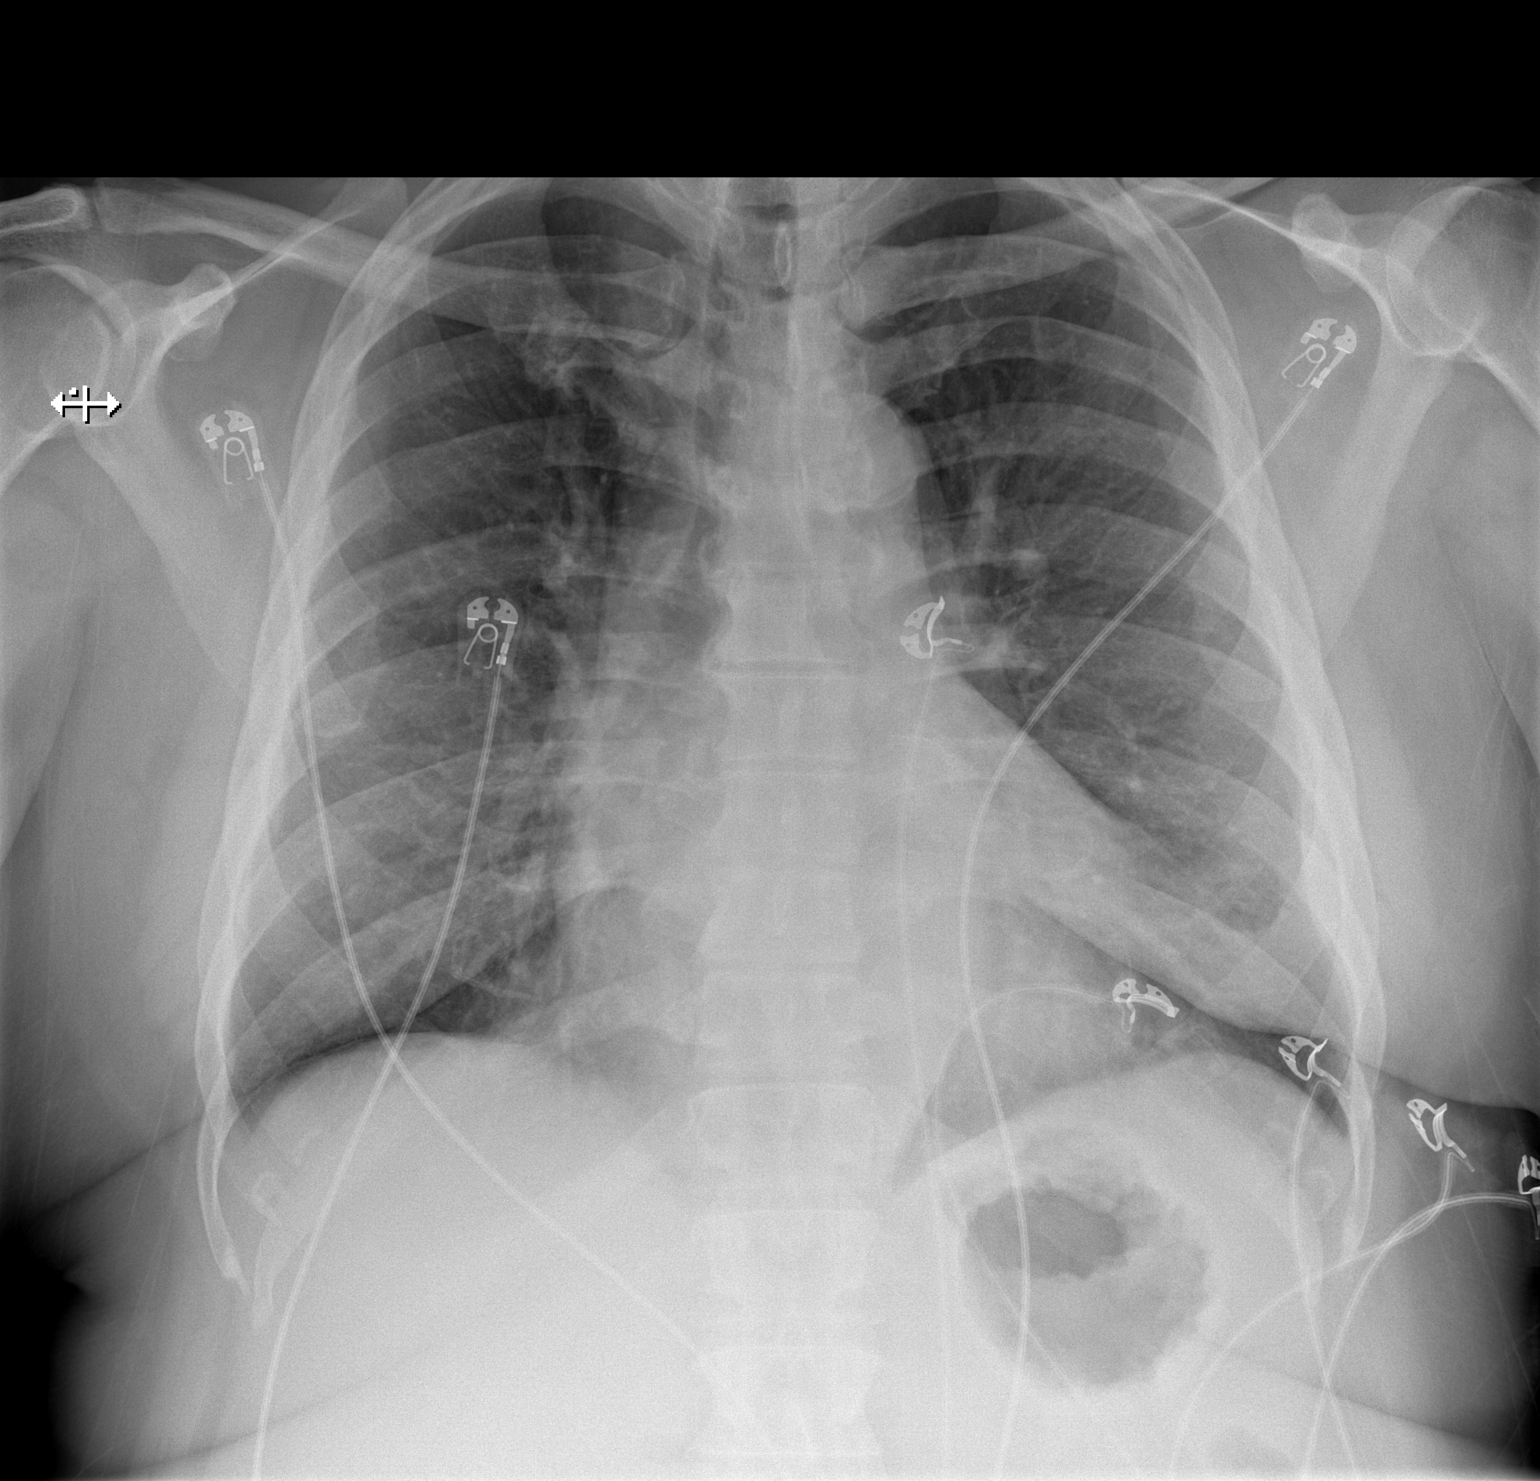

[w chest lat]
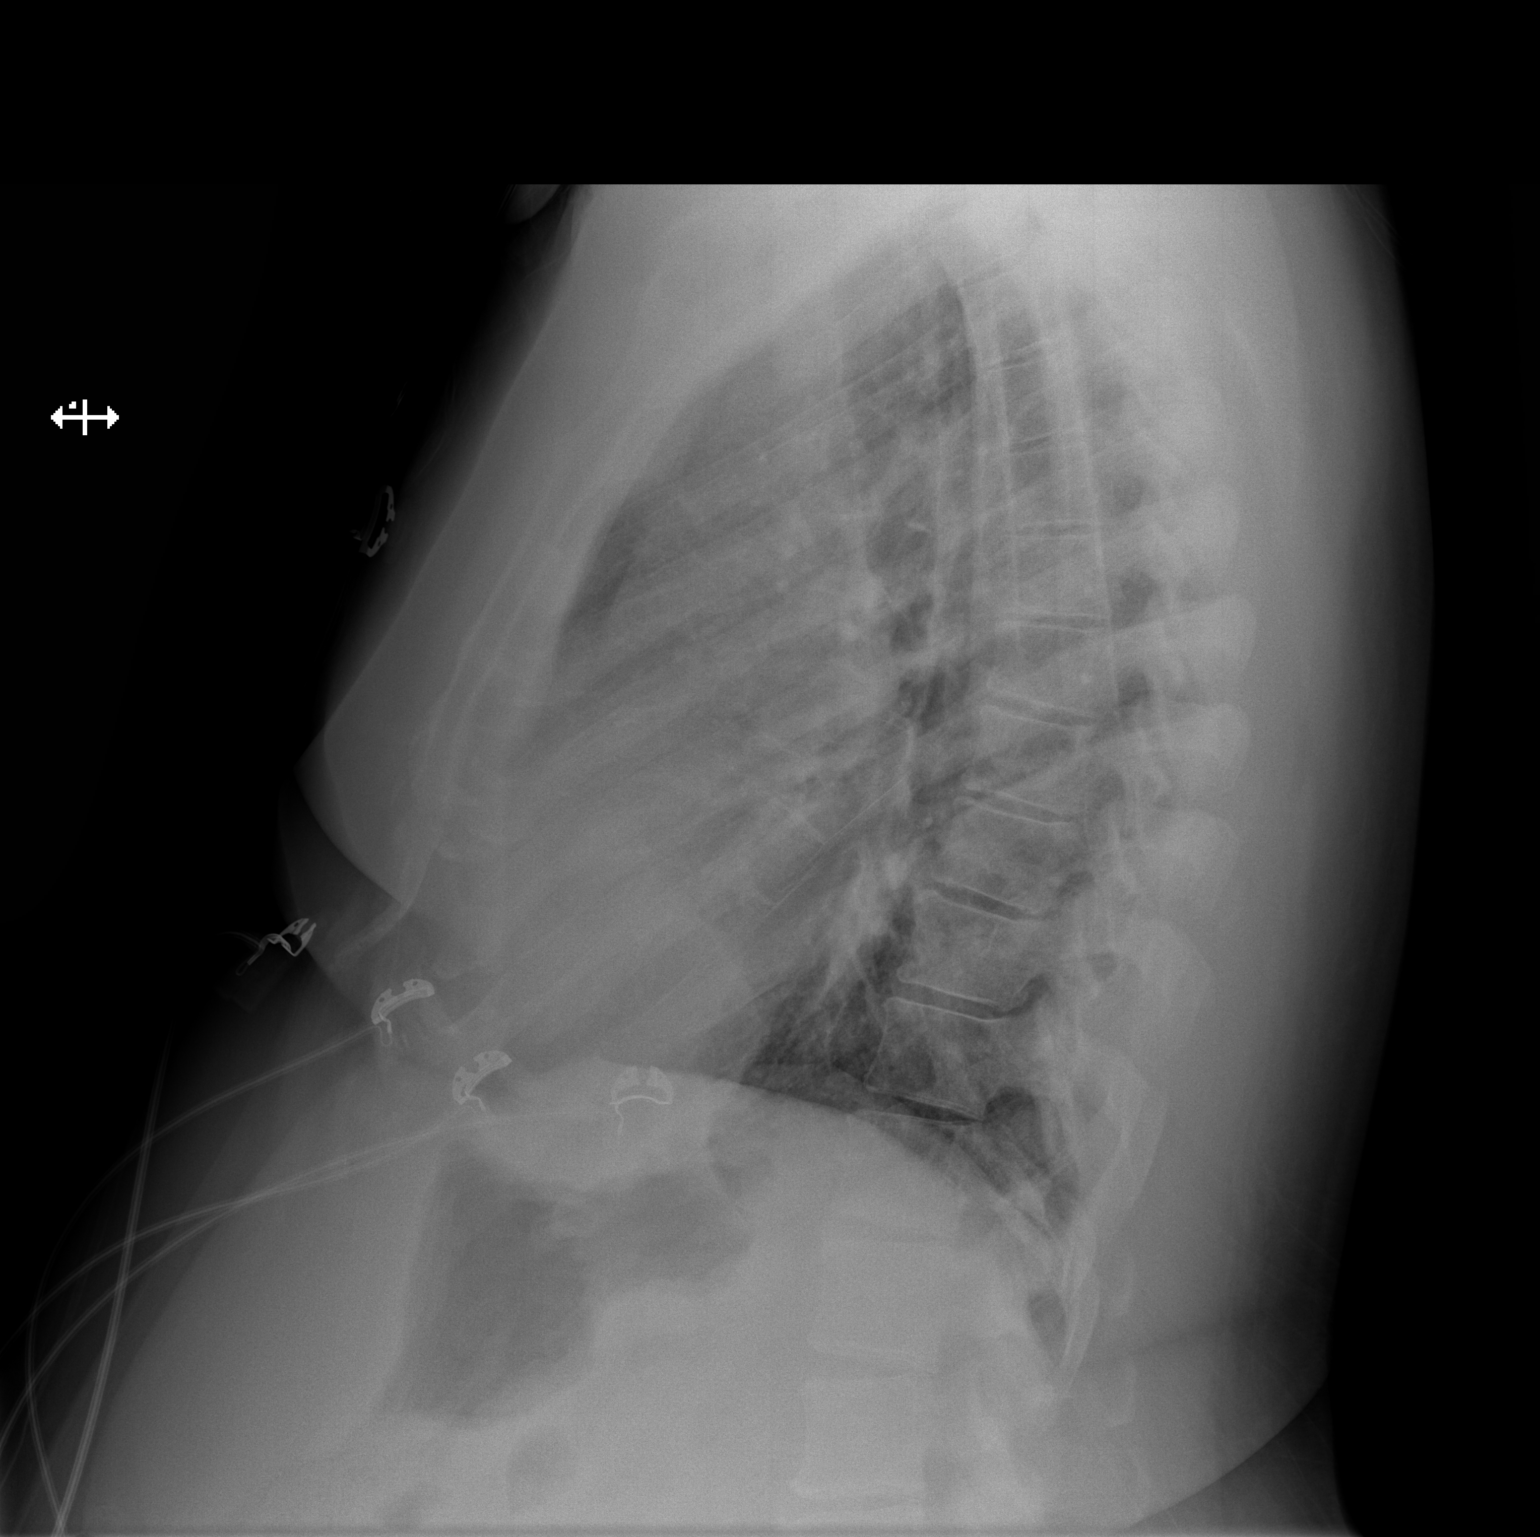

[2 of 2 positions shown; findings below may reference images not displayed]

FINDINGS: Cardiopericardial silhouette is at upper limits of normal for size.
The lungs are clear without focal pneumonia, edema, pneumothorax or
pleural effusion. The visualized bony structures of the thorax show
no acute abnormality. Telemetry leads overlie the chest.
IMPRESSION: No active cardiopulmonary disease.

## 2020-12-24 IMAGING — US US RENAL
1 series · 15 of 25 positions shown · non-contrast
Comparison: None.

CLINICAL DATA: DEEQA RAYAAN

EXAM:
RENAL / URINARY TRACT ULTRASOUND COMPLETE

[Series 1: us renal mc & wl · 15 of 46 slices shown]
[im 1/46]
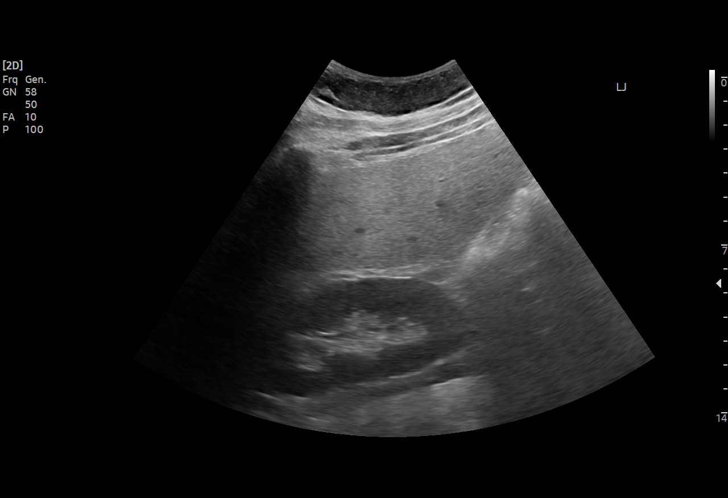
[im 4/46]
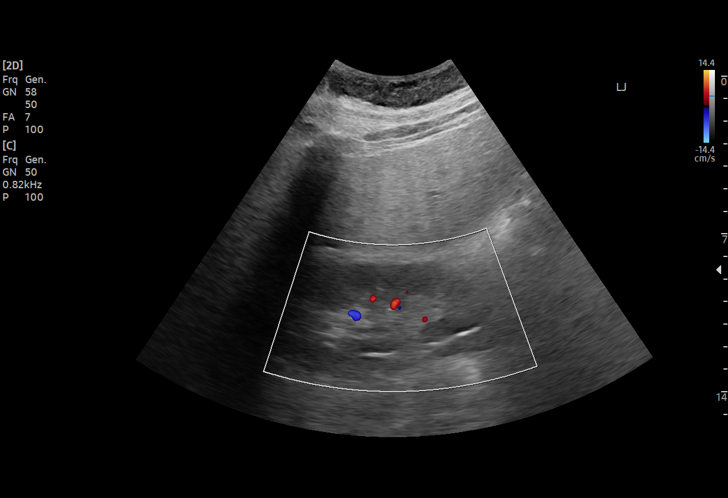
[im 8/46]
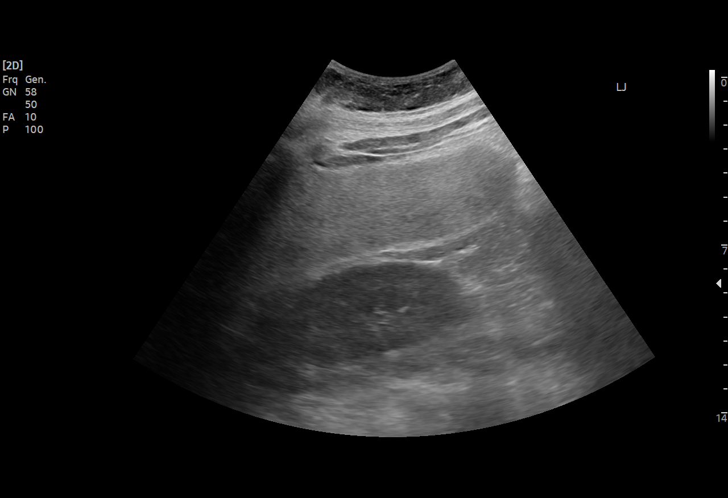
[im 10/46]
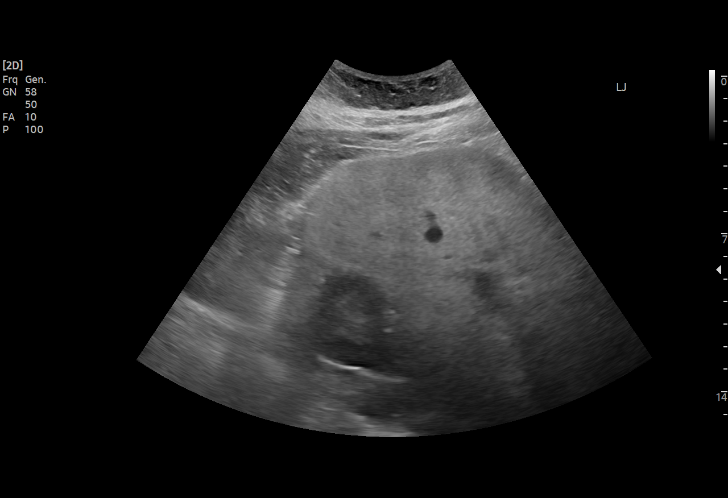
[im 14/46]
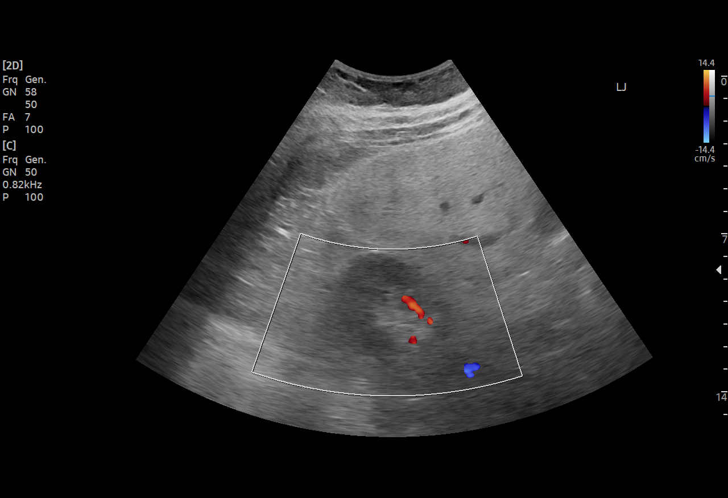
[im 17/46]
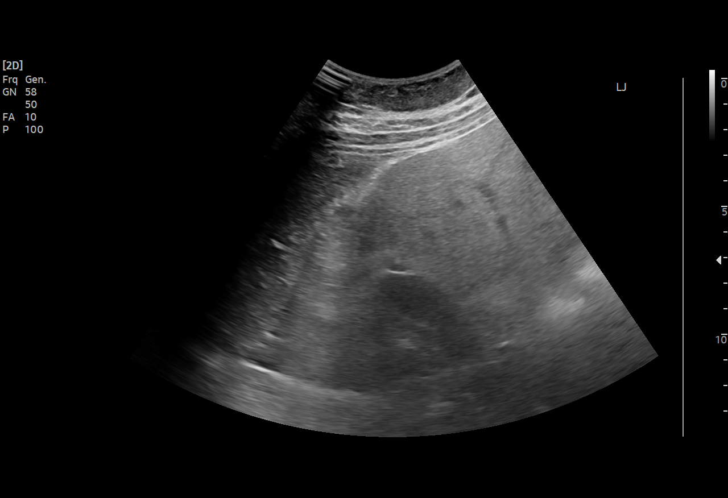
[im 19/46]
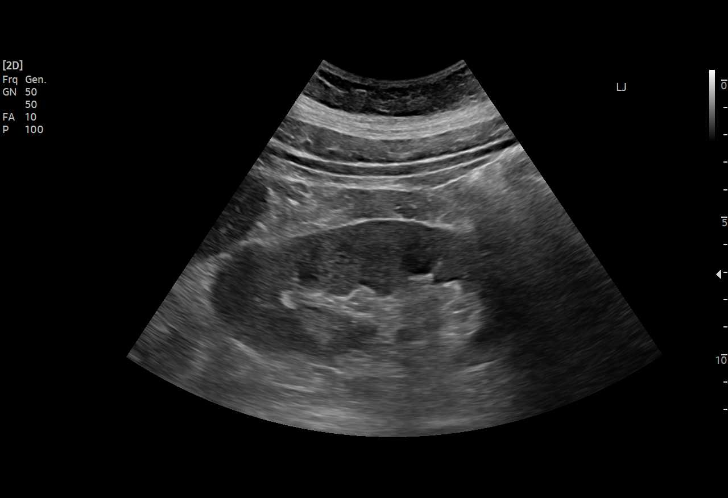
[im 23/46]
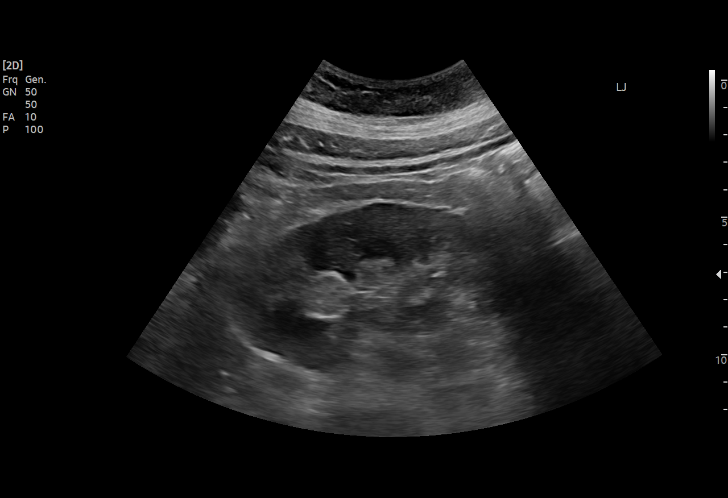
[im 27/46]
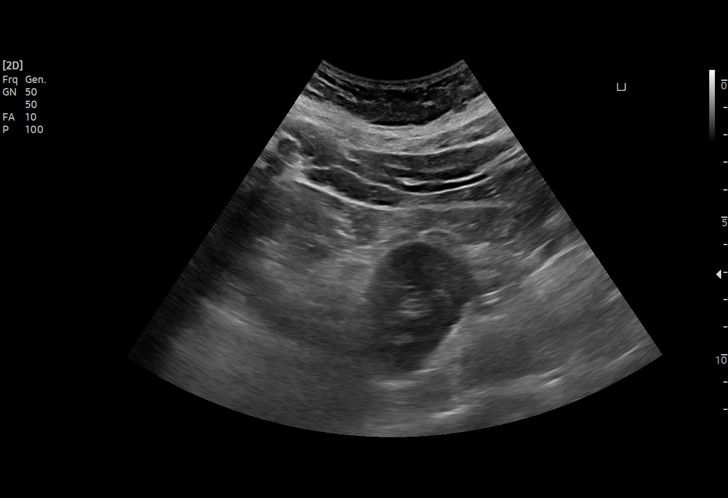
[im 29/46]
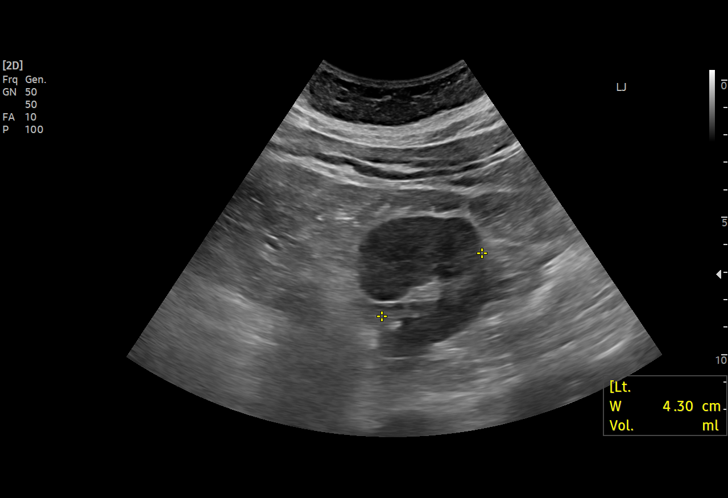
[im 32/46]
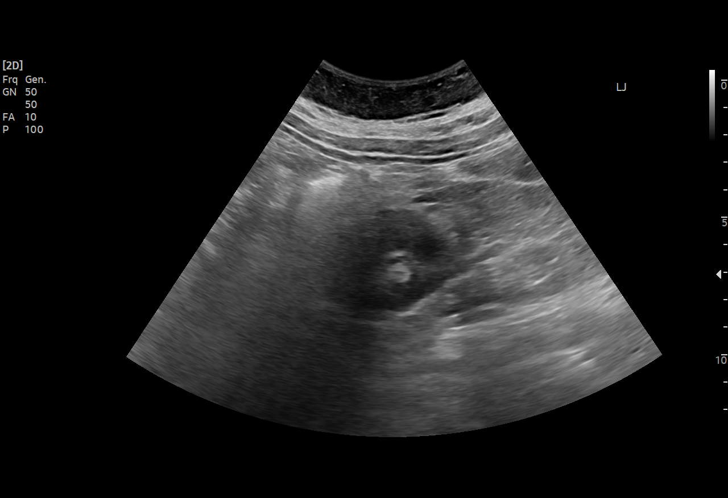
[im 36/46]
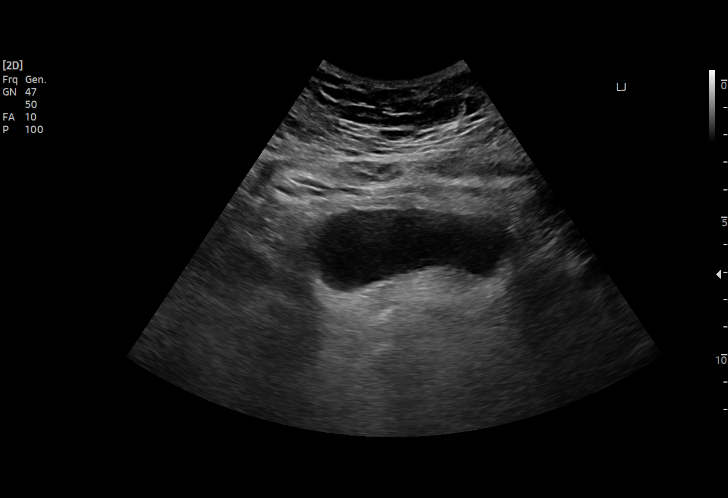
[im 38/46]
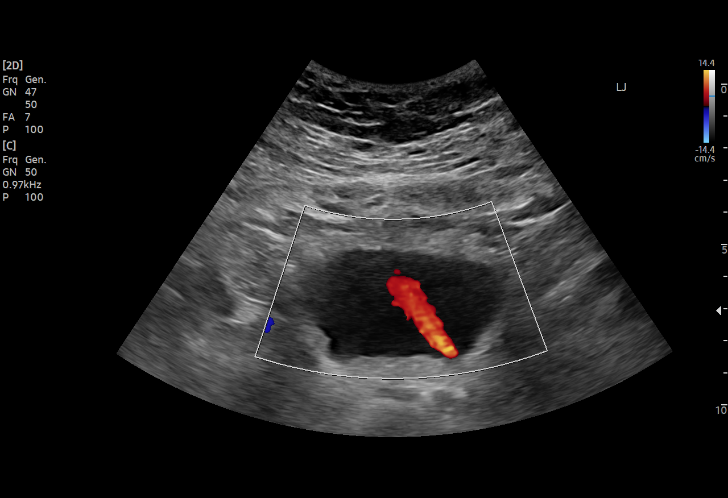
[im 42/46]
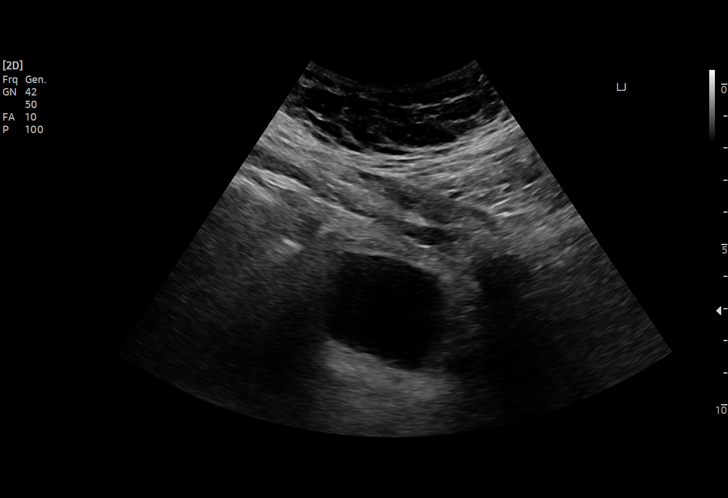
[im 46/46]
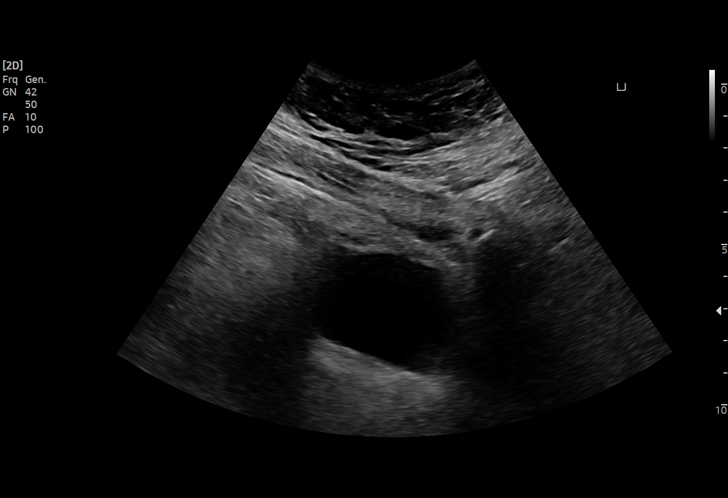

[15 of 25 positions shown; findings below may reference images not displayed]

FINDINGS: Right Kidney:

Renal measurements: 9.9 x 4.3 x 4.9cm = volume: 110 mL. Echogenicity
within normal limits. No mass or hydronephrosis visualized.

Left Kidney:

Renal measurements: 11.2 x 5.3 x 4.3cm = volume: 133 mL.
Echogenicity within normal limits. No mass or hydronephrosis
visualized.

Urinary bladder:

Appears normal for degree of bladder distention.

Other:

Likely hepatic steatosis.
IMPRESSION: 1. Likely hepatic steatosis.
2. Unremarkable renal ultrasound.

## 2020-12-24 MED ORDER — ASPIRIN 81 MG PO CHEW
81.0000 mg | CHEWABLE_TABLET | ORAL | Status: AC
  Administered 2020-12-25: 81 mg via ORAL
  Filled 2020-12-24: qty 1

## 2020-12-24 MED ORDER — CARVEDILOL 3.125 MG PO TABS
3.1250 mg | ORAL_TABLET | Freq: Two times a day (BID) | ORAL | Status: DC
  Administered 2020-12-24: 3.125 mg via ORAL
  Filled 2020-12-24 (×2): qty 1

## 2020-12-24 MED ORDER — THIAMINE HCL 100 MG PO TABS
100.0000 mg | ORAL_TABLET | Freq: Every day | ORAL | Status: DC
  Administered 2020-12-24 – 2021-01-01 (×9): 100 mg via ORAL
  Filled 2020-12-24 (×9): qty 1

## 2020-12-24 MED ORDER — ASPIRIN 81 MG PO CHEW
324.0000 mg | CHEWABLE_TABLET | Freq: Once | ORAL | Status: AC
  Administered 2020-12-24: 324 mg via ORAL
  Filled 2020-12-24: qty 4

## 2020-12-24 MED ORDER — ATORVASTATIN CALCIUM 80 MG PO TABS
80.0000 mg | ORAL_TABLET | Freq: Every day | ORAL | Status: DC
  Administered 2020-12-24 – 2021-01-01 (×9): 80 mg via ORAL
  Filled 2020-12-24 (×3): qty 1
  Filled 2020-12-24: qty 2
  Filled 2020-12-24 (×5): qty 1

## 2020-12-24 MED ORDER — INSULIN GLARGINE-YFGN 100 UNIT/ML ~~LOC~~ SOLN
15.0000 [IU] | Freq: Every day | SUBCUTANEOUS | Status: DC
  Administered 2020-12-24 – 2020-12-31 (×8): 15 [IU] via SUBCUTANEOUS
  Filled 2020-12-24 (×10): qty 0.15

## 2020-12-24 MED ORDER — INSULIN ASPART 100 UNIT/ML IJ SOLN
8.0000 [IU] | Freq: Once | INTRAMUSCULAR | Status: AC
  Administered 2020-12-24: 8 [IU] via SUBCUTANEOUS
  Filled 2020-12-24: qty 0.08

## 2020-12-24 MED ORDER — SODIUM CHLORIDE 0.9 % IV SOLN: 250.0000 mL | INTRAVENOUS | Status: DC | PRN

## 2020-12-24 MED ORDER — NITROGLYCERIN 0.4 MG SL SUBL: 0.4000 mg | SUBLINGUAL_TABLET | SUBLINGUAL | Status: DC | PRN

## 2020-12-24 MED ORDER — INSULIN ASPART 100 UNIT/ML IJ SOLN
0.0000 [IU] | Freq: Three times a day (TID) | INTRAMUSCULAR | Status: DC
  Administered 2020-12-25: 5 [IU] via SUBCUTANEOUS
  Administered 2020-12-26: 3 [IU] via SUBCUTANEOUS
  Administered 2020-12-26: 5 [IU] via SUBCUTANEOUS
  Administered 2020-12-26: 3 [IU] via SUBCUTANEOUS
  Administered 2020-12-27 (×2): 2 [IU] via SUBCUTANEOUS
  Administered 2020-12-27: 14:00:00 3 [IU] via SUBCUTANEOUS
  Administered 2020-12-28: 5 [IU] via SUBCUTANEOUS
  Administered 2020-12-28 – 2020-12-29 (×5): 3 [IU] via SUBCUTANEOUS
  Administered 2020-12-30 (×3): 2 [IU] via SUBCUTANEOUS
  Administered 2020-12-31: 3 [IU] via SUBCUTANEOUS
  Administered 2020-12-31 (×2): 2 [IU] via SUBCUTANEOUS
  Administered 2021-01-01: 3 [IU] via SUBCUTANEOUS
  Administered 2021-01-01: 2 [IU] via SUBCUTANEOUS

## 2020-12-24 MED ORDER — ASPIRIN EC 81 MG PO TBEC
81.0000 mg | DELAYED_RELEASE_TABLET | Freq: Every day | ORAL | Status: DC
  Filled 2020-12-24: qty 1

## 2020-12-24 MED ORDER — NITROGLYCERIN IN D5W 200-5 MCG/ML-% IV SOLN
0.0000 ug/min | INTRAVENOUS | Status: DC
  Administered 2020-12-24: 5 ug/min via INTRAVENOUS
  Administered 2020-12-25: 46.667 ug/min via INTRAVENOUS
  Administered 2020-12-25: 45 ug/min via INTRAVENOUS
  Administered 2020-12-26: 55 ug/min via INTRAVENOUS
  Administered 2020-12-26: 46.667 ug/min via INTRAVENOUS
  Administered 2020-12-27 – 2020-12-28 (×2): 60 ug/min via INTRAVENOUS
  Administered 2020-12-28 – 2020-12-29 (×2): 55 ug/min via INTRAVENOUS
  Filled 2020-12-24 (×8): qty 250

## 2020-12-24 MED ORDER — HEPARIN (PORCINE) 25000 UT/250ML-% IV SOLN
1400.0000 [IU]/h | INTRAVENOUS | Status: DC
Start: 2020-12-24 — End: 2020-12-25
  Administered 2020-12-24: 1000 [IU]/h via INTRAVENOUS
  Administered 2020-12-25: 1400 [IU]/h via INTRAVENOUS
  Filled 2020-12-24 (×2): qty 250

## 2020-12-24 MED ORDER — ADULT MULTIVITAMIN W/MINERALS CH
1.0000 | ORAL_TABLET | Freq: Every day | ORAL | Status: DC
  Administered 2020-12-24 – 2021-01-01 (×9): 1 via ORAL
  Filled 2020-12-24 (×9): qty 1

## 2020-12-24 MED ORDER — INSULIN ASPART 100 UNIT/ML IJ SOLN
0.0000 [IU] | Freq: Three times a day (TID) | INTRAMUSCULAR | Status: DC
  Administered 2020-12-24: 8 [IU] via SUBCUTANEOUS
  Filled 2020-12-24: qty 0.15

## 2020-12-24 MED ORDER — SODIUM CHLORIDE 0.9% FLUSH
3.0000 mL | Freq: Two times a day (BID) | INTRAVENOUS | Status: DC
  Administered 2020-12-24 – 2021-01-01 (×6): 3 mL via INTRAVENOUS

## 2020-12-24 MED ORDER — LORAZEPAM 1 MG PO TABS: 1.0000 mg | ORAL_TABLET | ORAL | Status: AC | PRN

## 2020-12-24 MED ORDER — INSULIN ASPART 100 UNIT/ML IJ SOLN
0.0000 [IU] | Freq: Every day | INTRAMUSCULAR | Status: DC
  Administered 2020-12-24: 3 [IU] via SUBCUTANEOUS
  Administered 2020-12-25: 5 [IU] via SUBCUTANEOUS
  Administered 2020-12-29 – 2020-12-31 (×2): 2 [IU] via SUBCUTANEOUS
  Filled 2020-12-24: qty 0.05

## 2020-12-24 MED ORDER — FOLIC ACID 1 MG PO TABS
1.0000 mg | ORAL_TABLET | Freq: Every day | ORAL | Status: DC
  Administered 2020-12-24 – 2021-01-01 (×9): 1 mg via ORAL
  Filled 2020-12-24 (×9): qty 1

## 2020-12-24 MED ORDER — THIAMINE HCL 100 MG/ML IJ SOLN: 100.0000 mg | Freq: Every day | INTRAMUSCULAR | Status: DC

## 2020-12-24 MED ORDER — PERFLUTREN LIPID MICROSPHERE
1.0000 mL | INTRAVENOUS | Status: DC | PRN
Start: 2020-12-24 — End: 2020-12-24
  Administered 2020-12-24: 2 mL via INTRAVENOUS
  Filled 2020-12-24: qty 10

## 2020-12-24 MED ORDER — LORAZEPAM 2 MG/ML IJ SOLN: 1.0000 mg | INTRAMUSCULAR | Status: AC | PRN

## 2020-12-24 MED ORDER — HEPARIN BOLUS VIA INFUSION
4000.0000 [IU] | Freq: Once | INTRAVENOUS | Status: AC
  Administered 2020-12-24: 4000 [IU] via INTRAVENOUS
  Filled 2020-12-24: qty 4000

## 2020-12-24 MED ORDER — SODIUM CHLORIDE 0.9 % WEIGHT BASED INFUSION
1.0000 mL/kg/h | INTRAVENOUS | Status: DC
  Administered 2020-12-24 – 2020-12-25 (×2): 1 mL/kg/h via INTRAVENOUS

## 2020-12-24 MED ORDER — SODIUM CHLORIDE 0.9% FLUSH: 3.0000 mL | INTRAVENOUS | Status: DC | PRN

## 2020-12-24 NOTE — ED Notes (Signed)
[facility] called. 

## 2020-12-24 NOTE — ED Notes (Signed)
Report given to Tai from [place] at [facility].

## 2020-12-24 NOTE — Progress Notes (Signed)
ANTICOAGULATION CONSULT NOTE   Pharmacy Consult for Heparin Indication: chest pain/ACS  Allergies  Allergen Reactions   Aspirin Other (See Comments)    Upset stomach      Patient Measurements:   Heparin Dosing Weight: 89.3 kg  Vital Signs: Temp: 98.2 F (36.8 C) (11/14 0658) BP: 198/123 (11/14 0800) Pulse Rate: 95 (11/14 0815)  Labs: Recent Labs    12/24/20 0651  HGB 16.2  HCT 46.4  PLT 242  CREATININE 1.56*  TROPONINIHS 158*   CrCl cannot be calculated (Unknown ideal weight.).  Medical History: Past Medical History:  Diagnosis Date   CAD (coronary artery disease)    a. 03/2015 NSTEMI: LHC with severe 3V CAD  (70% mid RCA, 95% OM1, 90% distal LCx, 90% OM3, 80% prox LAD and 90% ost D1) s/p DES to mLAD w/ small dissction Rx with DES, staged ost Ramus PCI/DES and dLCx s/p PCI/DES    Diabetes mellitus type 2 in obese ([facility])    Diverticulosis    Hypercholesteremia    Hypertension    Obesity    Tobacco abuse    Medications:  Scheduled:   heparin  4,000 Units Intravenous Once   Infusions:   heparin     nitroGLYCERIN 5 mcg/min (12/24/20 0858)   Assessment: 60 yr old male to ED with ShOB, substernal chest pain x 4 days. Hx 2017 NSTEMI - stent x3. Currently on ASA, Lisinopril; stated not taking Carvedilol, Atorvastatin. No anti-coagulation PTA  Heparin infusion for ACS    Goal of Therapy:  Heparin level 0.3-0.7 units/ml Monitor platelets by anticoagulation protocol: Yes   Plan:  Heparin 4000 unit bolus, infusion at 1000 units/hr Check 6 hr Heparin level Daily CBC, order daily Heparin level at steady state   Minda Ditto PharmD 12/24/2020,8:36 AM

## 2020-12-24 NOTE — H&P (Signed)
History and Physical    [name] TMH:[id number] DOB: 1960/08/21 DOA: 12/24/2020  PCP: Patient, No Pcp Per (Inactive)  Patient coming from: Home  Chief Complaint: Chest pain  HPI: [name] is a 60 y.o. male with medical history significant of DM2, HTN, CAD s/p PCI, HLD, tobacco abuse, EtOH abuse. Presenting with chest pain. Symptoms started one month ago. He had a midsternal chest tightness/achiness along w/ dyspnea. It felt like he "just ran a sprint w/ a dull ache" preventing him from catching his breath. It felt similar to his first heart attack. These episodes would come in waves lasting 4 - 10 minutes. When they happened, he would try to get still and focus on his breathing. He didn't have any medications to try. He didn't have any syncopal episodes with these events. He has noticed an increase in frequency of these episodes over the last several days. He had an episode that lasted for an hour this morning, so he decided to come to the ED.   ED Course: He was found to be hypertensive. He was started on NTG gtt. His trp were elevated. He was started on heparin gtt. Cardiology was consulted. TRH was called for admission.   Review of Systems:  Review of systems is otherwise negative for all not mentioned in HPI.   PMHx Past Medical History:  Diagnosis Date   CAD (coronary artery disease)    a. 03/2015 NSTEMI: LHC with severe 3V CAD  (70% mid RCA, 95% OM1, 90% distal LCx, 90% OM3, 80% prox LAD and 90% ost D1) s/p DES to mLAD w/ small dissction Rx with DES, staged ost Ramus PCI/DES and dLCx s/p PCI/DES    Diabetes mellitus type 2 in obese ([place])    Diverticulosis    Hypercholesteremia    Hypertension    Obesity    Tobacco abuse     PSHx Past Surgical History:  Procedure Laterality Date   CARDIAC CATHETERIZATION N/A 03/19/2015   Procedure: Left Heart Cath and Coronary Angiography;  Surgeon: Lorretta Harp, MD;  Location: [place] [facility];  Service: Cardiovascular;  Laterality: N/A;    CARDIAC CATHETERIZATION N/A 03/20/2015   Procedure: Coronary Stent Intervention;  Surgeon: Lorretta Harp, MD;  Location: [place] [facility];  Service: Cardiovascular;  Laterality: N/A;   CARDIAC CATHETERIZATION N/A 03/22/2015   Procedure: Coronary Stent Intervention;  Surgeon: Lorretta Harp, MD;  Location: [place] [facility];  Service: Cardiovascular;  Laterality: N/A;    SocHx  reports that he has been smoking cigarettes. He has been smoking an average of .5 packs per day. He has never used smokeless tobacco. He reports current alcohol use. He reports current drug use. Drug: Marijuana.  Allergies  Allergen Reactions   Aspirin Other (See Comments)    Upset stomach      FamHx History reviewed. No pertinent family history.  Prior to Admission medications   Medication Sig Start Date End Date Taking? Authorizing Provider  Aspirin-Acetaminophen-Caffeine (GOODY HEADACHE PO) Take 1 packet by mouth daily as needed (pain/headache).   Yes [provider]  lisinopril (ZESTRIL) 20 MG tablet Take 20 mg by mouth daily. 06/12/19  Yes [provider]  aspirin EC 81 MG EC tablet Take 1 tablet (81 mg total) by mouth daily. Patient not taking: Reported on 12/24/2020 03/23/15   Eileen Stanford, PA-C  atorvastatin (LIPITOR) 80 MG tablet Take 1 tablet (80 mg total) by mouth daily at 6 PM. Patient not taking: No sig reported  03/23/15   Eileen Stanford, PA-C  carvedilol (COREG) 3.125 MG tablet TAKE 1 TABLET BY MOUTH TWICE DAILY WITH A MEAL 05/28/17   Lorretta Harp, MD  insulin NPH-regular Human (70-30) 100 UNIT/ML injection Inject 46 Units into the skin daily. Patient not taking: Reported on 12/24/2020    [provider]  nitroGLYCERIN (NITROSTAT) 0.4 MG SL tablet Place 1 tablet (0.4 mg total) under the tongue every 5 (five) minutes as needed for chest pain (CP or SOB). 03/23/15   Eileen Stanford, PA-C  prasugrel (EFFIENT) 10 MG TABS tablet Take 1 tablet (10 mg total)  by mouth daily. Patient not taking: No sig reported 05/15/15   Erlene Quan, [place]    Physical Exam: Vitals:   12/24/20 1015 12/24/20 1115 12/24/20 1230 12/24/20 1257  BP: (!) 172/112 (!) 131/95 (!) 155/102 (!) 143/84  Pulse: 98 92 90 88  Resp: (!) 26 (!) 21 (!) 23   Temp:      SpO2: 97% 95% 95%   Weight:      Height:        General: 60 y.o. male resting in bed in NAD Eyes: PERRL, normal sclera ENMT: Nares patent w/o discharge, orophaynx clear, dentition normal, ears w/o discharge/lesions/ulcers Neck: Supple, trachea midline Cardiovascular: tachy, +S1, S2, no m/g/r, equal pulses throughout Respiratory: CTABL, no w/r/r, normal WOB GI: BS+, NDNT, no masses noted, no organomegaly noted MSK: No e/c/c Skin: No rashes, bruises, ulcerations noted Neuro: A&O x 3, no focal deficits Psyc: Appropriate interaction and affect, calm/cooperative  Labs on Admission: I have personally reviewed following labs and imaging studies  CBC: Recent Labs  Lab 12/24/20 0651  WBC 10.8*  NEUTROABS 5.7  HGB 16.2  HCT 46.4  MCV 90.8  PLT 951   Basic Metabolic Panel: Recent Labs  Lab 12/24/20 0651  NA 132*  K 4.0  CL 96*  CO2 27  GLUCOSE 432*  BUN 17  CREATININE 1.56*  CALCIUM 9.3   GFR: Estimated Creatinine Clearance: 57.7 mL/min (A) (by C-G formula based on SCr of 1.56 mg/dL (H)). Liver Function Tests: Recent Labs  Lab 12/24/20 0651  AST 32  ALT 42  ALKPHOS 131*  BILITOT 0.5  PROT 7.4  ALBUMIN 3.9   No results for input(s): LIPASE, AMYLASE in the last 168 hours. No results for input(s): AMMONIA in the last 168 hours. Coagulation Profile: No results for input(s): INR, PROTIME in the last 168 hours. Cardiac Enzymes: No results for input(s): CKTOTAL, CKMB, CKMBINDEX, TROPONINI in the last 168 hours. BNP (last 3 results) No results for input(s): PROBNP in the last 8760 hours. HbA1C: No results for input(s): HGBA1C in the last 72 hours. CBG: No results for input(s): GLUCAP  in the last 168 hours. Lipid Profile: No results for input(s): CHOL, HDL, LDLCALC, TRIG, CHOLHDL, LDLDIRECT in the last 72 hours. Thyroid Function Tests: No results for input(s): TSH, T4TOTAL, FREET4, T3FREE, THYROIDAB in the last 72 hours. Anemia Panel: No results for input(s): VITAMINB12, FOLATE, FERRITIN, TIBC, IRON, RETICCTPCT in the last 72 hours. Urine analysis:    Component Value Date/Time   COLORURINE STRAW (A) 12/24/2020 0713   APPEARANCEUR CLEAR 12/24/2020 0713   LABSPEC 1.020 12/24/2020 0713   PHURINE 7.0 12/24/2020 0713   GLUCOSEU >=500 (A) 12/24/2020 0713   HGBUR SMALL (A) 12/24/2020 0713   BILIRUBINUR NEGATIVE 12/24/2020 0713   KETONESUR NEGATIVE 12/24/2020 0713   PROTEINUR 100 (A) 12/24/2020 0713   NITRITE NEGATIVE 12/24/2020 0713   LEUKOCYTESUR NEGATIVE  12/24/2020 0713    Radiological Exams on Admission: DG Chest 2 View  Result Date: 12/24/2020 CLINICAL DATA:  Chest pain EXAM: CHEST - 2 VIEW COMPARISON:  08/25/2019 FINDINGS: Cardiopericardial silhouette is at upper limits of normal for size. The lungs are clear without focal pneumonia, edema, pneumothorax or pleural effusion. The visualized bony structures of the thorax show no acute abnormality. Telemetry leads overlie the chest. IMPRESSION: No active cardiopulmonary disease. Electronically Signed   By: Misty Stanley M.D.   On: 12/24/2020 07:10    EKG: Independently reviewed. Sinus tach; st depressions in V3/4  Assessment/Plan Chest pain Elevated Troponin NSTEMI     - admit to inpt, progressive @ [facility]     - cardiology consulted, appreciate assistance     - NPO pMN     - going for LHC in AM     - continue NTG gtt, heparin gtt     - trend trp     - echo ordered     - ASA, statin, coreg     - holding lisinopril (AKI)  HTN urgency     - continue NTG gtt; wean as able     - add coreg  AKI     - multifactorial      - check renal US     - hold lisinopril; watch nephrotoxins  DM2     - SSI, glucose  checks, A1c, DM diet for now  HLD CAD     - ASA, statin, BB  Tobacco abuse EtOH abuse Marijuana abuse     - counseled against further use     - CIWA  DVT prophylaxis: heparin gtt  Code Status: FULL  Family Communication: None at bedside  Consults called: EDP spoke with cardiology   Status is: Inpatient  Remains inpatient appropriate because: severity of illness  Darryon Bastin A Marylyn Ishihara DO [facility]  If 7PM-7AM, please contact night-coverage [email or web address]  12/24/2020, 1:10 PM

## 2020-12-24 NOTE — Progress Notes (Signed)
Inpatient Diabetes Program Recommendations  AACE/ADA: New Consensus Statement on Inpatient Glycemic Control   Target Ranges:  Prepandial:   less than 140 mg/dL      Peak postprandial:   less than 180 mg/dL (1-2 hours)      Critically ill patients:  140 - 180 mg/dL   Results for [name], [name] (MRN [id number]) as of 12/24/2020 13:20  Ref. Range 12/24/2020 06:51  Glucose Latest Ref Range: 70 - 99 mg/dL 432 (H)   Results for [name], [name] (MRN [id number]) as of 12/24/2020 13:20  Ref. Range 03/17/2015 05:47  Hemoglobin A1C Latest Ref Range: 4.8 - 5.6 % 13.4 (H)   Review of Glycemic Control  Diabetes history: DM2 Outpatient Diabetes medications: None in 6 months; has 70/30 46 units daily on home med list Current orders for Inpatient glycemic control: None; in ED  Inpatient Diabetes Program Recommendations:    Insulin: Please consider ordering Semglee 15 units 24H, CBGs AC&HS, Novolog 0-15 units TID with meals and Novolog 0-5 units QHS.  NOTE: Per chart, patient in ED with chest pain and to be admitted with NSTEMI. Initial glucose 432 mg/dl today (not acidotic per labs) and patient was given Novolog 8 units at 9:23 am today. In reviewing chart, noted patient had initial visit with J. Huffman, [place] with [facility] on 03/19/18 and had reported taking Tresiba 30 units every other day (due to cost) and has ran out of Ozempic (due to cost). No follow up with [facility] noted after initial appointment. Note today by Dr. Johney Frame patient reported running out of all medications 6 months ago. Patient has [employer] and goes to [facility] for primary care. Current A1C ordered. Diabetes coordinator will follow up with patient once A1C resulted.  Thanks, Barnie Alderman, RN, MSN, CDE Diabetes Coordinator Inpatient Diabetes Program [phone] (Team Pager from 8am to 5pm)

## 2020-12-24 NOTE — H&P (View-Only) (Signed)
Progress Note  Patient Name: [name] Date of Encounter: 12/25/2020  [facility] Cardiologist: Freada Bergeron, MD   Subjective   Doing well this AM. No chest pain or SOB. Anxious about cath today.   TTE with LVEF 55-60% with basal inferior hypokinesis.   Inpatient Medications    Scheduled Meds:  aspirin  81 mg Oral Pre-Cath   aspirin EC  81 mg Oral Daily   atorvastatin  80 mg Oral Daily   carvedilol  3.125 mg Oral BID WC   folic acid  1 mg Oral Daily   insulin aspart  0-15 Units Subcutaneous TID WC   insulin aspart  0-5 Units Subcutaneous QHS   insulin glargine-yfgn  15 Units Subcutaneous QHS   multivitamin with minerals  1 tablet Oral Daily   sodium chloride flush  3 mL Intravenous Q12H   thiamine  100 mg Oral Daily   Or   thiamine  100 mg Intravenous Daily   Continuous Infusions:  sodium chloride     sodium chloride 1 mL/kg/hr (12/24/20 2111)   heparin 1,250 Units/hr (12/25/20 0453)   nitroGLYCERIN 46.6667 mcg/min (12/25/20 0453)   PRN Meds: sodium chloride, LORazepam **OR** LORazepam, sodium chloride flush   Vital Signs    Vitals:   12/24/20 1841 12/24/20 2025 12/25/20 0028 12/25/20 0031  BP:  130/79  (!) 144/96  Pulse:    (!) 103  Resp: 19 18  15   Temp:  99 F (37.2 C)  98.5 F (36.9 C)  TempSrc:  Oral  Oral  SpO2:  100%  93%  Weight:   102.4 kg   Height:        Intake/Output Summary (Last 24 hours) at 12/25/2020 0510 Last data filed at 12/25/2020 0453 Gross per 24 hour  Intake 701.15 ml  Output --  Net 701.15 ml   Last 3 Weights 12/25/2020 12/24/2020 03/28/2015  Weight (lbs) 225 lb 11.2 oz 224 lb 0.9 oz 225 lb  Weight (kg) 102.377 kg 101.631 kg 102.059 kg      Telemetry    NSR with rare PVCs - Personally Reviewed  ECG    No new tracing - Personally Reviewed  Physical Exam   GEN: No acute distress.   Neck: No JVD Cardiac: RRR, no murmurs, rubs, or gallops.  Respiratory: Clear to auscultation bilaterally. GI: Soft,  nontender, non-distended  MS: No edema; No deformity. Neuro:  Nonfocal  Psych: Normal affect   Labs    High Sensitivity Troponin:   Recent Labs  Lab 12/24/20 0651 12/24/20 0851 12/24/20 1152 12/24/20 1352  TROPONINIHS 158* 171* 215* 233*     Chemistry Recent Labs  Lab 12/24/20 0651 12/25/20 0031  NA 132* 134*  K 4.0 3.8  CL 96* 99  CO2 27 27  GLUCOSE 432* 279*  BUN 17 20  CREATININE 1.56* 1.83*  CALCIUM 9.3 9.3  PROT 7.4 6.3*  ALBUMIN 3.9 3.2*  AST 32 22  ALT 42 32  ALKPHOS 131* 95  BILITOT 0.5 0.8  GFRNONAA 51* 42*  ANIONGAP 9 8    Lipids  Recent Labs  Lab 12/25/20 0031  CHOL 260*  TRIG 235*  HDL 48  LDLCALC 165*  CHOLHDL 5.4    Hematology Recent Labs  Lab 12/24/20 0651 12/25/20 0031  WBC 10.8* 12.7*  RBC 5.11 4.61  HGB 16.2 14.5  HCT 46.4 42.4  MCV 90.8 92.0  MCH 31.7 31.5  MCHC 34.9 34.2  RDW 12.0 12.0  PLT 242 250  Thyroid No results for input(s): TSH, FREET4 in the last 168 hours.  BNP Recent Labs  Lab 12/24/20 0651  BNP 412.0*    DDimer No results for input(s): DDIMER in the last 168 hours.   Radiology    DG Chest 2 View  Result Date: 12/24/2020 CLINICAL DATA:  Chest pain EXAM: CHEST - 2 VIEW COMPARISON:  08/25/2019 FINDINGS: Cardiopericardial silhouette is at upper limits of normal for size. The lungs are clear without focal pneumonia, edema, pneumothorax or pleural effusion. The visualized bony structures of the thorax show no acute abnormality. Telemetry leads overlie the chest. IMPRESSION: No active cardiopulmonary disease. Electronically Signed   By: Misty Stanley M.D.   On: 12/24/2020 07:10   US RENAL  Result Date: 12/24/2020 CLINICAL DATA:  AKI EXAM: RENAL / URINARY TRACT ULTRASOUND COMPLETE COMPARISON:  None. FINDINGS: Right Kidney: Renal measurements: 9.9 x 4.3 x 4.9cm = volume: 110 mL. Echogenicity within normal limits. No mass or hydronephrosis visualized. Left Kidney: Renal measurements: 11.2 x 5.3 x 4.3cm = volume:  133 mL. Echogenicity within normal limits. No mass or hydronephrosis visualized. Urinary bladder: Appears normal for degree of bladder distention. Other: Likely hepatic steatosis. IMPRESSION: 1. Likely hepatic steatosis. 2. Unremarkable renal ultrasound. Electronically Signed   By: Iven Finn M.D.   On: 12/24/2020 17:31   ECHOCARDIOGRAM COMPLETE  Result Date: 12/24/2020    ECHOCARDIOGRAM REPORT   Patient Name:   [name] Date of Exam: 12/24/2020 Medical Rec #:  [id number]  Height:       67.5 in Accession #:    [id number] Weight:       224.1 lb Date of Birth:  04-Oct-1960  BSA:          2.134 m Patient Age:    60 years   BP:           129/84 mmHg Patient Gender: M          HR:           85 bpm. Exam Location:  Inpatient Procedure: 2D Echo, Color Doppler, Cardiac Doppler and Intracardiac            Opacification Agent Indications:    NSTEMI I21.4  History:        Patient has prior history of Echocardiogram examinations, most                 recent 03/21/2015. CAD; Risk Factors:Hypertension, Diabetes,                 Dyslipidemia and Current Smoker.  Sonographer:    Darlina Sicilian RDCS Referring Phys: [id number] [place]  1. Left ventricular ejection fraction, by estimation, is 55 to 60%. The left ventricle has normal function. The left ventricle demonstrates regional wall motion abnormalities (see scoring diagram/findings for description). Left ventricular diastolic parameters are consistent with Grade I diastolic dysfunction (impaired relaxation).  2. Right ventricular systolic function is normal. The right ventricular size is normal.  3. The mitral valve is normal in structure. Mild mitral valve regurgitation. No evidence of mitral stenosis.  4. The aortic valve is normal in structure. Aortic valve regurgitation is not visualized. Aortic valve sclerosis is present, with no evidence of aortic valve stenosis.  5. The inferior vena cava is normal in size with greater than 50% respiratory  variability, suggesting right atrial pressure of 3 mmHg. FINDINGS  Left Ventricle: Left ventricular ejection fraction, by estimation, is 55 to 60%. The left ventricle has normal function. The  left ventricle demonstrates regional wall motion abnormalities. Definity contrast agent was given IV to delineate the left ventricular endocardial borders. The left ventricular internal cavity size was normal in size. There is no left ventricular hypertrophy. Left ventricular diastolic parameters are consistent with Grade I diastolic dysfunction (impaired relaxation).  LV Wall Scoring: The basal inferior segment is hypokinetic. The entire anterior wall, antero-lateral wall, mid and distal lateral wall, mid and distal anterior septum, inferior septum, entire apex, and mid and distal inferior wall are normal. Right Ventricle: The right ventricular size is normal. No increase in right ventricular wall thickness. Right ventricular systolic function is normal. Left Atrium: Left atrial size was normal in size. Right Atrium: Right atrial size was normal in size. Pericardium: There is no evidence of pericardial effusion. Mitral Valve: The mitral valve is normal in structure. Mild mitral annular calcification. Mild mitral valve regurgitation. No evidence of mitral valve stenosis. Tricuspid Valve: The tricuspid valve is normal in structure. Tricuspid valve regurgitation is not demonstrated. No evidence of tricuspid stenosis. Aortic Valve: The aortic valve is normal in structure. Aortic valve regurgitation is not visualized. Aortic valve sclerosis is present, with no evidence of aortic valve stenosis. Pulmonic Valve: The pulmonic valve was normal in structure. Pulmonic valve regurgitation is not visualized. No evidence of pulmonic stenosis. Aorta: The aortic root is normal in size and structure. Venous: The inferior vena cava was not well visualized. The inferior vena cava is normal in size with greater than 50% respiratory variability,  suggesting right atrial pressure of 3 mmHg. IAS/Shunts: No atrial level shunt detected by color flow Doppler.  LEFT VENTRICLE PLAX 2D LVIDd:         4.20 cm      Diastology LVIDs:         3.60 cm      LV e' medial:    3.75 cm/s LV PW:         1.30 cm      LV E/e' medial:  16.6 LV IVS:        1.10 cm      LV e' lateral:   3.15 cm/s LVOT diam:     2.20 cm      LV E/e' lateral: 19.7 LV SV:         49 LV SV Index:   23 LVOT Area:     3.80 cm  LV Volumes (MOD) LV vol d, MOD A2C: 85.2 ml LV vol d, MOD A4C: 132.0 ml LV vol s, MOD A2C: 34.4 ml LV vol s, MOD A4C: 64.4 ml LV SV MOD A2C:     50.8 ml LV SV MOD A4C:     132.0 ml LV SV MOD BP:      59.9 ml LEFT ATRIUM             Index LA diam:        3.80 cm 1.78 cm/m LA Vol (A2C):   38.0 ml 17.81 ml/m LA Vol (A4C):   21.4 ml 10.03 ml/m LA Biplane Vol: 30.3 ml 14.20 ml/m  AORTIC VALVE LVOT Vmax:   74.40 cm/s LVOT Vmean:  52.200 cm/s LVOT VTI:    0.130 m  AORTA Ao Root diam: 3.10 cm MITRAL VALVE MV Area (PHT): 2.34 cm    SHUNTS MV Decel Time: 324 msec    Systemic VTI:  0.13 m MV E velocity: 62.10 cm/s  Systemic Diam: 2.20 cm MV A velocity: 89.50 cm/s MV E/A ratio:  0.69 Kardie Tobb DO Electronically  signed by Berniece Salines DO Signature Date/Time: 12/24/2020/4:04:55 PM    Final     Cardiac Studies  TTE 12/24/20: IMPRESSIONS   1. Left ventricular ejection fraction, by estimation, is 55 to 60%. The  left ventricle has normal function. The left ventricle demonstrates  regional wall motion abnormalities (see scoring diagram/findings for  description). Left ventricular diastolic  parameters are consistent with Grade I diastolic dysfunction (impaired  relaxation).   2. Right ventricular systolic function is normal. The right ventricular  size is normal.   3. The mitral valve is normal in structure. Mild mitral valve  regurgitation. No evidence of mitral stenosis.   4. The aortic valve is normal in structure. Aortic valve regurgitation is  not visualized. Aortic valve  sclerosis is present, with no evidence of  aortic valve stenosis.   5. The inferior vena cava is normal in size with greater than 50%  respiratory variability, suggesting right atrial pressure of 3 mmHg.   Echo 03/21/2015 LV EF: 55% -   60%   -------------------------------------------------------------------  Indications:      CAD of native vessels 414.01.   -------------------------------------------------------------------  History:   PMH:   Myocardial infarction.  Risk factors:  Current  tobacco use. Hypertension. Diabetes mellitus. Dyslipidemia.   -------------------------------------------------------------------  Study Conclusions   - Left ventricle: The cavity size was normal. There was moderate    concentric hypertrophy. Systolic function was normal. The    estimated ejection fraction was in the range of 55% to 60%.    Possible akinesis of the basalinferior myocardium. Doppler    parameters are consistent with abnormal left ventricular    relaxation (grade 1 diastolic dysfunction).  - Aortic valve: Trileaflet; mildly thickened, mildly calcified    leaflets.   Impressions:   - Compared to the prior study, there has been no significant    interval change.   Coronary Stent Intervention 12/24/20    Conclusion   Mid RCA lesion, 60% stenosed. Ost Ramus to Ramus lesion, 95% stenosed. Post intervention, there is a 0% residual stenosis. Dist Cx lesion, 95% stenosed. Post intervention, there is a 0% residual stenosis.   IMPRESSION:successful staged LAD, first obtuse marginal branch and left PLA branch PCI and drug-eluting stenting using synergy drug-eluting stents with a moderate mid dominant RCA stenosis found not to be physiologically significant. He has preserved LV function. This diagonal branch which was occluded at the time of LAD stenting 2 days ago had mild restoration of flow. He sheath was removed and a TR band was placed on the right wrist to achieve patent  hemostasis. The patient left the lab in stable condition.He'll be hydrated for the next 12 hours, discharged home in the morning on dual antiplatelet therapy and I will see him back in the office in 2-3 weeks.   Diagnostic Dominance: Right Intervention        Patient Profile     60 y.o. 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 Now presenting with classic anginal symptoms in the setting of stopping all medications. Now planned for cath today.  -Continue ASA  81mg  daily -Continue lipitor 80mg  daily -Increase coreg to 6.25mg  BID -Continue nitro gtt for now; transition to ACE/ARB post-cath  #HTN: -On nitro gtt for now -Increase coreg to 6.25mg  BID -Add ACE/ARB post-cath -Will likely need additional agent (amlodipine)  #HLD: TC 260, LDL 165, TG 235 not on statin -Continue lipitor 80mg  daily -Goal LDL<70 -Will need repeat lipids as out-patient  #DMII: -Management per primary  INFORMED CONSENT: I have reviewed the risks, indications, and alternatives to cardiac catheterization, possible angioplasty, and stenting with the patient. Risks include but are not limited to bleeding, infection, vascular injury, stroke, myocardial infection, arrhythmia, kidney injury, radiation-related injury in the case of prolonged fluoroscopy use, emergency cardiac surgery, and death. The patient understands the risks of serious complication is 1-2 in [id number] with diagnostic cardiac cath and 1-2% or less with angioplasty/stenting.        For questions or updates, please contact [place] Please consult [email or web address] for contact info  under        Signed, Freada Bergeron, MD  12/25/2020, 5:10 AM

## 2020-12-24 NOTE — ED Triage Notes (Signed)
Pt reports with [facility] and chest pain x 4 days. Pt states that he gets winded just from taking a few steps. Pt reports the pain being in the center of his chest.

## 2020-12-24 NOTE — ED Provider Notes (Addendum)
[facility] DEPT Provider Note   CSN: [id number] Arrival date & time: 12/24/20  5638     History Chief Complaint  Patient presents with   Chest Pain   Shortness of Breath    [name] is a 60 y.o. male.  The history is provided by the patient.  Chest Pain Pain location:  Substernal area Pain quality: pressure   Pain radiates to:  Does not radiate Pain severity:  Moderate Onset quality:  Gradual Duration:  4 days Timing:  Intermittent Progression:  Waxing and waning Chronicity:  New Context comment:  Exertion Relieved by:  Nothing Worsened by:  Exertion Associated symptoms: shortness of breath   Associated symptoms: no abdominal pain, no back pain, no cough, no fever, no palpitations and no vomiting   Shortness of Breath Associated symptoms: chest pain   Associated symptoms: no abdominal pain, no cough, no ear pain, no fever, no rash, no sore throat and no vomiting       Past Medical History:  Diagnosis Date   CAD (coronary artery disease)    a. 03/2015 NSTEMI: LHC with severe 3V CAD  (70% mid RCA, 95% OM1, 90% distal LCx, 90% OM3, 80% prox LAD and 90% ost D1) s/p DES to mLAD w/ small dissction Rx with DES, staged ost Ramus PCI/DES and dLCx s/p PCI/DES    Diabetes mellitus type 2 in obese (HCC)    Diverticulosis    Hypercholesteremia    Hypertension    Obesity    Tobacco abuse     Patient Active Problem List   Diagnosis Date Noted   CAD (coronary artery disease)    Diabetes mellitus type 2 in obese ([place])    Hypercholesteremia    Tobacco abuse    Obesity    NSTEMI (non-ST elevated myocardial infarction) ([place]) 03/17/2015   Essential hypertension 03/16/2015   Hyperlipidemia 03/16/2015   Dyspnea on exertion 03/16/2015   Diverticulosis     Past Surgical History:  Procedure Laterality Date   CARDIAC CATHETERIZATION N/A 03/19/2015   Procedure: Left Heart Cath and Coronary Angiography;  Surgeon: Lorretta Harp, MD;  Location:  [facility];  Service: Cardiovascular;  Laterality: N/A;   CARDIAC CATHETERIZATION N/A 03/20/2015   Procedure: Coronary Stent Intervention;  Surgeon: Lorretta Harp, MD;  Location: [facility];  Service: Cardiovascular;  Laterality: N/A;   CARDIAC CATHETERIZATION N/A 03/22/2015   Procedure: Coronary Stent Intervention;  Surgeon: Lorretta Harp, MD;  Location: [facility];  Service: Cardiovascular;  Laterality: N/A;       No family history on file.  Social History   Tobacco Use   Smoking status: Every Day    Packs/day: 0.50    Types: Cigarettes  Substance Use Topics   Alcohol use: Yes    Comment: weekend   Drug use: Yes    Types: Marijuana    Home Medications Prior to Admission medications   Medication Sig Start Date End Date Taking? Authorizing Provider  aspirin EC 81 MG EC tablet Take 1 tablet (81 mg total) by mouth daily. 03/23/15   Eileen Stanford, PA-C  Aspirin-Acetaminophen-Caffeine (GOODY HEADACHE PO) Take 1 packet by mouth as needed (pain/headache).    [provider]  aspirin-sod bicarb-citric acid (ALKA-SELTZER) 325 MG TBEF tablet Take 325 mg by mouth every 6 (six) hours as needed (congestion).    [provider]  atorvastatin (LIPITOR) 80 MG tablet Take 1 tablet (80 mg total) by mouth daily at  6 PM. Patient not taking: Reported on 08/26/2019 03/23/15   Eileen Stanford, PA-C  carvedilol (COREG) 3.125 MG tablet TAKE 1 TABLET BY MOUTH TWICE DAILY WITH A MEAL Patient not taking: Reported on 08/26/2019 05/28/17   Lorretta Harp, MD  insulin NPH-regular Human (70-30) 100 UNIT/ML injection Inject 46 Units into the skin daily.    [provider]  lisinopril (PRINIVIL,ZESTRIL) 10 MG tablet Take 1 tablet (10 mg total) by mouth daily. Patient not taking: Reported on 08/26/2019 06/20/15   Lorretta Harp, MD  lisinopril (ZESTRIL) 20 MG tablet Take 20 mg by mouth daily. 06/12/19   [provider]  nitroGLYCERIN  (NITROSTAT) 0.4 MG SL tablet Place 1 tablet (0.4 mg total) under the tongue every 5 (five) minutes as needed for chest pain (CP or SOB). Patient not taking: Reported on 08/26/2019 03/23/15   Eileen Stanford, PA-C  polyvinyl alcohol (LIQUIFILM TEARS) 1.4 % ophthalmic solution Place 1 drop into both eyes as needed for dry eyes.    [provider]  prasugrel (EFFIENT) 10 MG TABS tablet Take 1 tablet (10 mg total) by mouth daily. Patient not taking: Reported on 08/26/2019 05/15/15   Erlene Quan, PA-C    Allergies    Aspirin  Review of Systems   Review of Systems  Constitutional:  Negative for chills and fever.  HENT:  Negative for ear pain and sore throat.   Eyes:  Negative for pain and visual disturbance.  Respiratory:  Positive for shortness of breath. Negative for cough.   Cardiovascular:  Positive for chest pain. Negative for palpitations.  Gastrointestinal:  Negative for abdominal pain and vomiting.  Genitourinary:  Negative for dysuria and hematuria.  Musculoskeletal:  Negative for arthralgias and back pain.  Skin:  Negative for color change and rash.  Neurological:  Negative for seizures and syncope.  All other systems reviewed and are negative.  Physical Exam Updated Vital Signs BP (!) 192/127   Pulse (!) 102   Temp 98.2 F (36.8 C)   Resp (!) 23   SpO2 97%   Physical Exam Vitals and nursing note reviewed.  Constitutional:      General: He is not in acute distress.    Appearance: He is well-developed. He is not ill-appearing.  HENT:     Head: Normocephalic and atraumatic.  Eyes:     Extraocular Movements: Extraocular movements intact.     Conjunctiva/sclera: Conjunctivae normal.     Pupils: Pupils are equal, round, and reactive to light.  Cardiovascular:     Rate and Rhythm: Normal rate and regular rhythm.     Pulses:          Radial pulses are 2+ on the right side and 2+ on the left side.     Heart sounds: Normal heart sounds. No murmur  heard. Pulmonary:     Effort: Pulmonary effort is normal. No respiratory distress.     Breath sounds: Normal breath sounds. No decreased breath sounds.  Abdominal:     Palpations: Abdomen is soft.     Tenderness: There is no abdominal tenderness.  Musculoskeletal:     Cervical back: Normal range of motion and neck supple.     Right lower leg: Edema present.     Left lower leg: Edema present.  Skin:    General: Skin is warm and dry.     Capillary Refill: Capillary refill takes less than 2 seconds.  Neurological:     General: No focal deficit present.  Mental Status: He is alert.    ED Results / Procedures / Treatments   Labs (all labs ordered are listed, but only abnormal results are displayed) Labs Reviewed  CBC WITH DIFFERENTIAL/PLATELET - Abnormal; Notable for the following components:      Result Value   WBC 10.8 (*)    Abs Immature Granulocytes 0.12 (*)    All other components within normal limits  COMPREHENSIVE METABOLIC PANEL - Abnormal; Notable for the following components:   Sodium 132 (*)    Chloride 96 (*)    Glucose, Bld 432 (*)    Creatinine, Ser 1.56 (*)    Alkaline Phosphatase 131 (*)    GFR, Estimated 51 (*)    All other components within normal limits  URINALYSIS, ROUTINE W REFLEX MICROSCOPIC - Abnormal; Notable for the following components:   Color, Urine STRAW (*)    Glucose, UA >=500 (*)    Hgb urine dipstick SMALL (*)    Protein, ur 100 (*)    All other components within normal limits  BRAIN NATRIURETIC PEPTIDE - Abnormal; Notable for the following components:   B Natriuretic Peptide 412.0 (*)    All other components within normal limits  TROPONIN I (HIGH SENSITIVITY) - Abnormal; Notable for the following components:   Troponin I (High Sensitivity) 158 (*)    All other components within normal limits    EKG EKG Interpretation  Date/Time:  Monday December 24 2020 07:08:44 EST Ventricular Rate:  110 PR Interval:  141 QRS Duration: 94 QT  Interval:  327 QTC Calculation: 443 R Axis:   57 Text Interpretation: Sinus tachycardia Atrial premature complex Biatrial enlargement Confirmed by Lennice Sites ([id number]) on 12/24/2020 7:19:05 AM  Radiology DG Chest 2 View  Result Date: 12/24/2020 CLINICAL DATA:  Chest pain EXAM: CHEST - 2 VIEW COMPARISON:  08/25/2019 FINDINGS: Cardiopericardial silhouette is at upper limits of normal for size. The lungs are clear without focal pneumonia, edema, pneumothorax or pleural effusion. The visualized bony structures of the thorax show no acute abnormality. Telemetry leads overlie the chest. IMPRESSION: No active cardiopulmonary disease. Electronically Signed   By: Misty Stanley M.D.   On: 12/24/2020 07:10    Procedures .Critical Care Performed by: Lennice Sites, DO Authorized by: Lennice Sites, DO   Critical care provider statement:    Critical care time (minutes):  40   Critical care was necessary to treat or prevent imminent or life-threatening deterioration of the following conditions:  Cardiac failure   Critical care was time spent personally by me on the following activities:  Blood draw for specimens, development of treatment plan with patient or surrogate, discussions with consultants, discussions with primary provider, evaluation of patient's response to treatment, obtaining history from patient or surrogate, ordering and review of laboratory studies, ordering and performing treatments and interventions, ordering and review of radiographic studies and pulse oximetry   Care discussed with: admitting provider     Medications Ordered in ED Medications - No data to display  ED Course  I have reviewed the triage vital signs and the nursing notes.  Pertinent labs & imaging results that were available during my care of the patient were reviewed by me and considered in my medical decision making (see chart for details).    MDM Rules/Calculators/A&P                           Vernal Hritz is  here with chest pain or shortness of  breath.  Patient hypertensive but otherwise unremarkable vitals.  Patient with history of CAD.  Has not been on any of his medications recently.  Does not follow-up with cardiology since his heart attack about 5 years ago.  Had a stent placed at that time.  Has been having exertional chest pain for the last for 5 days.  No current chest pain now.  But having episodes of exertional chest pain last about 10 minutes then improves at rest.  EKG shows ST depressions diffusely.  Slightly worse than previous EKGs.  No obvious ST elevation changes to suggest STEMI.  Troponin is elevated at 158.  BNP also elevated at 412.  Chest x-ray with no signs of volume overload.  Lab work otherwise is unremarkable.  Cardiology has been called for admission for NSTEMI.  We will start patient on IV heparin, aspirin, IV nitroglycerin given high blood pressure.  Overall concern for ST elevation MI versus hypertensive emergency.  Will admit for further work-up.  Cards would like medicine admit.  This chart was dictated using voice recognition software.  Despite best efforts to proofread,  errors can occur which can change the documentation meaning.   Final Clinical Impression(s) / ED Diagnoses Final diagnoses:  NSTEMI (non-ST elevated myocardial infarction) [facility])    Rx / [facility] Orders ED Discharge Orders     None        Lennice Sites, DO 12/24/20 0933    Lennice Sites, DO 12/24/20 1232

## 2020-12-24 NOTE — ED Notes (Signed)
Pt leaving with [facility] at this time.  

## 2020-12-24 NOTE — Progress Notes (Signed)
Progress Note  Patient Name: [name] Date of Encounter: 12/25/2020  [facility] Cardiologist: Freada Bergeron, MD   Subjective   Doing well this AM. No chest pain or SOB. Anxious about cath today.   TTE with LVEF 55-60% with basal inferior hypokinesis.   Inpatient Medications    Scheduled Meds:  aspirin  81 mg Oral Pre-Cath   aspirin EC  81 mg Oral Daily   atorvastatin  80 mg Oral Daily   carvedilol  3.125 mg Oral BID WC   folic acid  1 mg Oral Daily   insulin aspart  0-15 Units Subcutaneous TID WC   insulin aspart  0-5 Units Subcutaneous QHS   insulin glargine-yfgn  15 Units Subcutaneous QHS   multivitamin with minerals  1 tablet Oral Daily   sodium chloride flush  3 mL Intravenous Q12H   thiamine  100 mg Oral Daily   Or   thiamine  100 mg Intravenous Daily   Continuous Infusions:  sodium chloride     sodium chloride 1 mL/kg/hr (12/24/20 2111)   heparin 1,250 Units/hr (12/25/20 0453)   nitroGLYCERIN 46.6667 mcg/min (12/25/20 0453)   PRN Meds: sodium chloride, LORazepam **OR** LORazepam, sodium chloride flush   Vital Signs    Vitals:   12/24/20 1841 12/24/20 2025 12/25/20 0028 12/25/20 0031  BP:  130/79  (!) 144/96  Pulse:    (!) 103  Resp: 19 18  15   Temp:  99 F (37.2 C)  98.5 F (36.9 C)  TempSrc:  Oral  Oral  SpO2:  100%  93%  Weight:   102.4 kg   Height:        Intake/Output Summary (Last 24 hours) at 12/25/2020 0510 Last data filed at 12/25/2020 0453 Gross per 24 hour  Intake 701.15 ml  Output --  Net 701.15 ml   Last 3 Weights 12/25/2020 12/24/2020 03/28/2015  Weight (lbs) 225 lb 11.2 oz 224 lb 0.9 oz 225 lb  Weight (kg) 102.377 kg 101.631 kg 102.059 kg      Telemetry    NSR with rare PVCs - Personally Reviewed  ECG    No new tracing - Personally Reviewed  Physical Exam   GEN: No acute distress.   Neck: No JVD Cardiac: RRR, no murmurs, rubs, or gallops.  Respiratory: Clear to auscultation bilaterally. GI: Soft,  nontender, non-distended  MS: No edema; No deformity. Neuro:  Nonfocal  Psych: Normal affect   Labs    High Sensitivity Troponin:   Recent Labs  Lab 12/24/20 0651 12/24/20 0851 12/24/20 1152 12/24/20 1352  TROPONINIHS 158* 171* 215* 233*     Chemistry Recent Labs  Lab 12/24/20 0651 12/25/20 0031  NA 132* 134*  K 4.0 3.8  CL 96* 99  CO2 27 27  GLUCOSE 432* 279*  BUN 17 20  CREATININE 1.56* 1.83*  CALCIUM 9.3 9.3  PROT 7.4 6.3*  ALBUMIN 3.9 3.2*  AST 32 22  ALT 42 32  ALKPHOS 131* 95  BILITOT 0.5 0.8  GFRNONAA 51* 42*  ANIONGAP 9 8    Lipids  Recent Labs  Lab 12/25/20 0031  CHOL 260*  TRIG 235*  HDL 48  LDLCALC 165*  CHOLHDL 5.4    Hematology Recent Labs  Lab 12/24/20 0651 12/25/20 0031  WBC 10.8* 12.7*  RBC 5.11 4.61  HGB 16.2 14.5  HCT 46.4 42.4  MCV 90.8 92.0  MCH 31.7 31.5  MCHC 34.9 34.2  RDW 12.0 12.0  PLT 242 250  Thyroid No results for input(s): TSH, FREET4 in the last 168 hours.  BNP Recent Labs  Lab 12/24/20 0651  BNP 412.0*    DDimer No results for input(s): DDIMER in the last 168 hours.   Radiology    DG Chest 2 View  Result Date: 12/24/2020 CLINICAL DATA:  Chest pain EXAM: CHEST - 2 VIEW COMPARISON:  08/25/2019 FINDINGS: Cardiopericardial silhouette is at upper limits of normal for size. The lungs are clear without focal pneumonia, edema, pneumothorax or pleural effusion. The visualized bony structures of the thorax show no acute abnormality. Telemetry leads overlie the chest. IMPRESSION: No active cardiopulmonary disease. Electronically Signed   By: Misty Stanley M.D.   On: 12/24/2020 07:10   US RENAL  Result Date: 12/24/2020 CLINICAL DATA:  AKI EXAM: RENAL / URINARY TRACT ULTRASOUND COMPLETE COMPARISON:  None. FINDINGS: Right Kidney: Renal measurements: 9.9 x 4.3 x 4.9cm = volume: 110 mL. Echogenicity within normal limits. No mass or hydronephrosis visualized. Left Kidney: Renal measurements: 11.2 x 5.3 x 4.3cm = volume:  133 mL. Echogenicity within normal limits. No mass or hydronephrosis visualized. Urinary bladder: Appears normal for degree of bladder distention. Other: Likely hepatic steatosis. IMPRESSION: 1. Likely hepatic steatosis. 2. Unremarkable renal ultrasound. Electronically Signed   By: Iven Finn M.D.   On: 12/24/2020 17:31   ECHOCARDIOGRAM COMPLETE  Result Date: 12/24/2020    ECHOCARDIOGRAM REPORT   Patient Name:   [name] Date of Exam: 12/24/2020 Medical Rec #:  [id number]  Height:       67.5 in Accession #:    [id number] Weight:       224.1 lb Date of Birth:  1960-12-03  BSA:          2.134 m Patient Age:    60 years   BP:           129/84 mmHg Patient Gender: M          HR:           85 bpm. Exam Location:  Inpatient Procedure: 2D Echo, Color Doppler, Cardiac Doppler and Intracardiac            Opacification Agent Indications:    NSTEMI I21.4  History:        Patient has prior history of Echocardiogram examinations, most                 recent 03/21/2015. CAD; Risk Factors:Hypertension, Diabetes,                 Dyslipidemia and Current Smoker.  Sonographer:    Darlina Sicilian RDCS Referring Phys: [id number] [place]  1. Left ventricular ejection fraction, by estimation, is 55 to 60%. The left ventricle has normal function. The left ventricle demonstrates regional wall motion abnormalities (see scoring diagram/findings for description). Left ventricular diastolic parameters are consistent with Grade I diastolic dysfunction (impaired relaxation).  2. Right ventricular systolic function is normal. The right ventricular size is normal.  3. The mitral valve is normal in structure. Mild mitral valve regurgitation. No evidence of mitral stenosis.  4. The aortic valve is normal in structure. Aortic valve regurgitation is not visualized. Aortic valve sclerosis is present, with no evidence of aortic valve stenosis.  5. The inferior vena cava is normal in size with greater than 50% respiratory  variability, suggesting right atrial pressure of 3 mmHg. FINDINGS  Left Ventricle: Left ventricular ejection fraction, by estimation, is 55 to 60%. The left ventricle has normal function. The  left ventricle demonstrates regional wall motion abnormalities. Definity contrast agent was given IV to delineate the left ventricular endocardial borders. The left ventricular internal cavity size was normal in size. There is no left ventricular hypertrophy. Left ventricular diastolic parameters are consistent with Grade I diastolic dysfunction (impaired relaxation).  LV Wall Scoring: The basal inferior segment is hypokinetic. The entire anterior wall, antero-lateral wall, mid and distal lateral wall, mid and distal anterior septum, inferior septum, entire apex, and mid and distal inferior wall are normal. Right Ventricle: The right ventricular size is normal. No increase in right ventricular wall thickness. Right ventricular systolic function is normal. Left Atrium: Left atrial size was normal in size. Right Atrium: Right atrial size was normal in size. Pericardium: There is no evidence of pericardial effusion. Mitral Valve: The mitral valve is normal in structure. Mild mitral annular calcification. Mild mitral valve regurgitation. No evidence of mitral valve stenosis. Tricuspid Valve: The tricuspid valve is normal in structure. Tricuspid valve regurgitation is not demonstrated. No evidence of tricuspid stenosis. Aortic Valve: The aortic valve is normal in structure. Aortic valve regurgitation is not visualized. Aortic valve sclerosis is present, with no evidence of aortic valve stenosis. Pulmonic Valve: The pulmonic valve was normal in structure. Pulmonic valve regurgitation is not visualized. No evidence of pulmonic stenosis. Aorta: The aortic root is normal in size and structure. Venous: The inferior vena cava was not well visualized. The inferior vena cava is normal in size with greater than 50% respiratory variability,  suggesting right atrial pressure of 3 mmHg. IAS/Shunts: No atrial level shunt detected by color flow Doppler.  LEFT VENTRICLE PLAX 2D LVIDd:         4.20 cm      Diastology LVIDs:         3.60 cm      LV e' medial:    3.75 cm/s LV PW:         1.30 cm      LV E/e' medial:  16.6 LV IVS:        1.10 cm      LV e' lateral:   3.15 cm/s LVOT diam:     2.20 cm      LV E/e' lateral: 19.7 LV SV:         49 LV SV Index:   23 LVOT Area:     3.80 cm  LV Volumes (MOD) LV vol d, MOD A2C: 85.2 ml LV vol d, MOD A4C: 132.0 ml LV vol s, MOD A2C: 34.4 ml LV vol s, MOD A4C: 64.4 ml LV SV MOD A2C:     50.8 ml LV SV MOD A4C:     132.0 ml LV SV MOD BP:      59.9 ml LEFT ATRIUM             Index LA diam:        3.80 cm 1.78 cm/m LA Vol (A2C):   38.0 ml 17.81 ml/m LA Vol (A4C):   21.4 ml 10.03 ml/m LA Biplane Vol: 30.3 ml 14.20 ml/m  AORTIC VALVE LVOT Vmax:   74.40 cm/s LVOT Vmean:  52.200 cm/s LVOT VTI:    0.130 m  AORTA Ao Root diam: 3.10 cm MITRAL VALVE MV Area (PHT): 2.34 cm    SHUNTS MV Decel Time: 324 msec    Systemic VTI:  0.13 m MV E velocity: 62.10 cm/s  Systemic Diam: 2.20 cm MV A velocity: 89.50 cm/s MV E/A ratio:  0.69 Kardie Tobb DO Electronically  signed by Berniece Salines DO Signature Date/Time: 12/24/2020/4:04:55 PM    Final     Cardiac Studies  TTE 12/24/20: IMPRESSIONS   1. Left ventricular ejection fraction, by estimation, is 55 to 60%. The  left ventricle has normal function. The left ventricle demonstrates  regional wall motion abnormalities (see scoring diagram/findings for  description). Left ventricular diastolic  parameters are consistent with Grade I diastolic dysfunction (impaired  relaxation).   2. Right ventricular systolic function is normal. The right ventricular  size is normal.   3. The mitral valve is normal in structure. Mild mitral valve  regurgitation. No evidence of mitral stenosis.   4. The aortic valve is normal in structure. Aortic valve regurgitation is  not visualized. Aortic valve  sclerosis is present, with no evidence of  aortic valve stenosis.   5. The inferior vena cava is normal in size with greater than 50%  respiratory variability, suggesting right atrial pressure of 3 mmHg.   Echo 03/21/2015 LV EF: 55% -   60%   -------------------------------------------------------------------  Indications:      CAD of native vessels 414.01.   -------------------------------------------------------------------  History:   PMH:   Myocardial infarction.  Risk factors:  Current  tobacco use. Hypertension. Diabetes mellitus. Dyslipidemia.   -------------------------------------------------------------------  Study Conclusions   - Left ventricle: The cavity size was normal. There was moderate    concentric hypertrophy. Systolic function was normal. The    estimated ejection fraction was in the range of 55% to 60%.    Possible akinesis of the basalinferior myocardium. Doppler    parameters are consistent with abnormal left ventricular    relaxation (grade 1 diastolic dysfunction).  - Aortic valve: Trileaflet; mildly thickened, mildly calcified    leaflets.   Impressions:   - Compared to the prior study, there has been no significant    interval change.   Coronary Stent Intervention 12/24/20    Conclusion   Mid RCA lesion, 60% stenosed. Ost Ramus to Ramus lesion, 95% stenosed. Post intervention, there is a 0% residual stenosis. Dist Cx lesion, 95% stenosed. Post intervention, there is a 0% residual stenosis.   IMPRESSION:successful staged LAD, first obtuse marginal branch and left PLA branch PCI and drug-eluting stenting using synergy drug-eluting stents with a moderate mid dominant RCA stenosis found not to be physiologically significant. He has preserved LV function. This diagonal branch which was occluded at the time of LAD stenting 2 days ago had mild restoration of flow. He sheath was removed and a TR band was placed on the right wrist to achieve patent  hemostasis. The patient left the lab in stable condition.He'll be hydrated for the next 12 hours, discharged home in the morning on dual antiplatelet therapy and I will see him back in the office in 2-3 weeks.   Diagnostic Dominance: Right Intervention        Patient Profile     60 y.o. 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 Now presenting with classic anginal symptoms in the setting of stopping all medications. Now planned for cath today.  -Continue ASA  81mg  daily -Continue lipitor 80mg  daily -Increase coreg to 6.25mg  BID -Continue nitro gtt for now; transition to ACE/ARB post-cath  #HTN: -On nitro gtt for now -Increase coreg to 6.25mg  BID -Add ACE/ARB post-cath -Will likely need additional agent (amlodipine)  #HLD: TC 260, LDL 165, TG 235 not on statin -Continue lipitor 80mg  daily -Goal LDL<70 -Will need repeat lipids as out-patient  #DMII: -Management per primary  INFORMED CONSENT: I have reviewed the risks, indications, and alternatives to cardiac catheterization, possible angioplasty, and stenting with the patient. Risks include but are not limited to bleeding, infection, vascular injury, stroke, myocardial infection, arrhythmia, kidney injury, radiation-related injury in the case of prolonged fluoroscopy use, emergency cardiac surgery, and death. The patient understands the risks of serious complication is 1-2 in 0626 with diagnostic cardiac cath and 1-2% or less with angioplasty/stenting.        For questions or updates, please contact [place] Please consult [email or web address] for contact info  under        Signed, Freada Bergeron, MD  12/25/2020, 5:10 AM

## 2020-12-24 NOTE — Consult Note (Addendum)
Cardiology Consultation:   Patient ID: Carrington Olazabal MRN: [id number]; DOB: 04/02/1960  Admit date: 12/24/2020 Date of Consult: 12/24/2020  PCP:  Patient, No Pcp Per (Inactive)   [facility] Providers Cardiologist:  Quay Burow, MD   {    Patient Profile:   [place] Dube is a 60 y.o. male with a hx of CAD s/p Multiple stent, HTN, HLD, DM, marijuana and alcohol abuse who is being seen 12/24/2020 for the evaluation of NSTEMI at the request of Dr. Ivin Booty.  Admitted 03/2015 with NSTEMI: - He underwent LHC on 03/19/15 which revealed 3V CAD (70% mid RCA, 95% OM1, 90% distal LCx, 90% OM3, 80% prox LAD and 90% ost D1) with normal LV function. Initially it was felt be was best suited for CABG. Then after discussion with Dr. Martinique, it was decided to proceed with that a percutaneous approach to the LCX OM and distal LCX (prob culprit vessel) with LAD FFR as the patient was young and had normal LV function.   -- Echo 03/18/2015 EF 55-60%, grade 1 DD. -- Cath 03/20/2015 75% mid LAD with significant FFR treated with 3.5x19mm long synergy, small distal dissection treated with 3x12 mm Synergy DES, small D1 was lost during procedure with onset of CP and EKG changes -- Post procedure, trop went from 0.39 up to 1.66 --> 8.05, likely representing infarct of diagonal territory as pt did not have any chest pain before procedure started therefore LCx artery being responsible for chest pain   -- Echo 03/21/15 EF 55-60% with no change from previous despite diag infarction during cath.   -- Cath 03/22/15  mRCA 60% sten, oRamus 95% sten s/p DES and dLCx 95% sten s/p DES    History of Present Illness:   Mr. Mestas lost his follow-up with Dr. Gwenlyn Found after his stenting in 2017.  Reports ran out of all of his medications 6 months ago.  He was seen at [facility] 2 months ago and started on lisinopril.  No follow-up since then.  He reports exertional shortness of breath which has progressively worsened for past 1 to  2 months.  For the past 4 days, he started to having substernal chest pressure with shortness of breath with exertion.  Symptoms are improving with rest.  Last night he had worse episode of substernal chest tightness with shortness of breath and diaphoresis when woke of to use bathroom.  He could not go to sleep due to pressure and came to ER.  Upon arrival he was noted hypertensive at systolic blood pressure in 200s.  Elevated troponin.  He was started on IV heparin and nitroglycerin drip.  No recurrent chest pain since then.  Blood pressure is improving.  Reports symptoms similar to prior angina but less intensity.  He did not have sublingual nitroglycerin at home.  Sedentary lifestyle.   Denies tobacco use but does vapor.  Occasional marijuana smoking, last use is about a week ago.  He drinks multiple shots each night.   Past Medical History:  Diagnosis Date   CAD (coronary artery disease)    a. 03/2015 NSTEMI: LHC with severe 3V CAD  (70% mid RCA, 95% OM1, 90% distal LCx, 90% OM3, 80% prox LAD and 90% ost D1) s/p DES to mLAD w/ small dissction Rx with DES, staged ost Ramus PCI/DES and dLCx s/p PCI/DES    Diabetes mellitus type 2 in obese ([place])    Diverticulosis    Hypercholesteremia    Hypertension    Obesity  Tobacco abuse     Past Surgical History:  Procedure Laterality Date   CARDIAC CATHETERIZATION N/A 03/19/2015   Procedure: Left Heart Cath and Coronary Angiography;  Surgeon: Lorretta Harp, MD;  Location: [place] [facility];  Service: Cardiovascular;  Laterality: N/A;   CARDIAC CATHETERIZATION N/A 03/20/2015   Procedure: Coronary Stent Intervention;  Surgeon: Lorretta Harp, MD;  Location: [place] [facility];  Service: Cardiovascular;  Laterality: N/A;   CARDIAC CATHETERIZATION N/A 03/22/2015   Procedure: Coronary Stent Intervention;  Surgeon: Lorretta Harp, MD;  Location: [place] [facility];  Service: Cardiovascular;  Laterality: N/A;     Inpatient Medications: Scheduled  Meds:  atorvastatin  80 mg Oral Daily   carvedilol  3.125 mg Oral BID WC   Continuous Infusions:  heparin 1,000 Units/hr (12/24/20 0941)   nitroGLYCERIN 30 mcg/min (12/24/20 1101)   PRN Meds:   Allergies:    Allergies  Allergen Reactions   Aspirin Other (See Comments)    Upset stomach      Social History:   Social History   Socioeconomic History   Marital status: Married    Spouse name: Not on file   Number of children: Not on file   Years of education: Not on file   Highest education level: Not on file  Occupational History   Not on file  Tobacco Use   Smoking status: Every Day    Packs/day: 0.50    Types: Cigarettes   Smokeless tobacco: Never  Vaping Use   Vaping Use: Never used  Substance and Sexual Activity   Alcohol use: Yes    Comment: weekend   Drug use: Yes    Types: Marijuana   Sexual activity: Not on file  Other Topics Concern   Not on file  Social History Narrative   Not on file   Social Determinants of Health   Financial Resource Strain: Not on file  Food Insecurity: Not on file  Transportation Needs: Not on file  Physical Activity: Not on file  Stress: Not on file  Social Connections: Not on file  Intimate Partner Violence: Not on file    Family History:   Patient did not provide a family history  ROS:  Please see the history of present illness.  All other ROS reviewed and negative.     Physical Exam/Data:   Vitals:   12/24/20 0900 12/24/20 0930 12/24/20 1015 12/24/20 1115  BP: (!) 193/112 (!) 197/113 (!) 172/112 (!) 131/95  Pulse: 95 92 98 92  Resp: (!) 28 17 (!) 26 (!) 21  Temp:      SpO2: 94% 95% 97% 95%  Weight:      Height:       No intake or output data in the 24 hours ending 12/24/20 1237 Last 3 Weights 12/24/2020 03/28/2015 03/23/2015  Weight (lbs) 224 lb 0.9 oz 225 lb 224 lb 13.9 oz  Weight (kg) 101.631 kg 102.059 kg 102 kg     Body mass index is 34.57 kg/m.  General:  Well nourished, well developed, in no acute  distress HEENT: normal Neck: no JVD Vascular: No carotid bruits; Distal pulses 2+ bilaterally Cardiac:  normal S1, S2; RRR; no murmur  Lungs:  clear to auscultation bilaterally, no wheezing, rhonchi or rales  Abd: soft, nontender, no hepatomegaly  Ext: no edema Musculoskeletal:  No deformities, BUE and BLE strength normal and equal Skin: warm and dry  Neuro:  CNs 2-12 intact, no focal abnormalities noted Psych:  Normal  affect   EKG:  The EKG was personally reviewed and demonstrates: Sinus rhythm, LVH with repolarization abnormality Telemetry:  Telemetry was personally reviewed and demonstrates:  sinus rhythm  Relevant CV Studies:  Echo 03/21/2015 LV EF: 55% -   60%   -------------------------------------------------------------------  Indications:      CAD of native vessels 414.01.   -------------------------------------------------------------------  History:   PMH:   Myocardial infarction.  Risk factors:  Current  tobacco use. Hypertension. Diabetes mellitus. Dyslipidemia.   -------------------------------------------------------------------  Study Conclusions   - Left ventricle: The cavity size was normal. There was moderate    concentric hypertrophy. Systolic function was normal. The    estimated ejection fraction was in the range of 55% to 60%.    Possible akinesis of the basalinferior myocardium. Doppler    parameters are consistent with abnormal left ventricular    relaxation (grade 1 diastolic dysfunction).  - Aortic valve: Trileaflet; mildly thickened, mildly calcified    leaflets.   Impressions:   - Compared to the prior study, there has been no significant    interval change.  Coronary Stent Intervention 12/24/20   Conclusion  Mid RCA lesion, 60% stenosed. Ost Ramus to Ramus lesion, 95% stenosed. Post intervention, there is a 0% residual stenosis. Dist Cx lesion, 95% stenosed. Post intervention, there is a 0% residual stenosis.   IMPRESSION:successful  staged LAD, first obtuse marginal branch and left PLA branch PCI and drug-eluting stenting using synergy drug-eluting stents with a moderate mid dominant RCA stenosis found not to be physiologically significant. He has preserved LV function. This diagonal branch which was occluded at the time of LAD stenting 2 days ago had mild restoration of flow. He sheath was removed and a TR band was placed on the right wrist to achieve patent hemostasis. The patient left the lab in stable condition.He'll be hydrated for the next 12 hours, discharged home in the morning on dual antiplatelet therapy and I will see him back in the office in 2-3 weeks.  Diagnostic Dominance: Right Intervention    Laboratory Data:  High Sensitivity Troponin:   Recent Labs  Lab 12/24/20 0651 12/24/20 0851  TROPONINIHS 158* 171*     Chemistry Recent Labs  Lab 12/24/20 0651  NA 132*  K 4.0  CL 96*  CO2 27  GLUCOSE 432*  BUN 17  CREATININE 1.56*  CALCIUM 9.3  GFRNONAA 51*  ANIONGAP 9    Recent Labs  Lab 12/24/20 0651  PROT 7.4  ALBUMIN 3.9  AST 32  ALT 42  ALKPHOS 131*  BILITOT 0.5    Hematology Recent Labs  Lab 12/24/20 0651  WBC 10.8*  RBC 5.11  HGB 16.2  HCT 46.4  MCV 90.8  MCH 31.7  MCHC 34.9  RDW 12.0  PLT 242    BNP Recent Labs  Lab 12/24/20 0651  BNP 412.0*     Radiology/Studies:  DG Chest 2 View  Result Date: 12/24/2020 CLINICAL DATA:  Chest pain EXAM: CHEST - 2 VIEW COMPARISON:  08/25/2019 FINDINGS: Cardiopericardial silhouette is at upper limits of normal for size. The lungs are clear without focal pneumonia, edema, pneumothorax or pleural effusion. The visualized bony structures of the thorax show no acute abnormality. Telemetry leads overlie the chest. IMPRESSION: No active cardiopulmonary disease. Electronically Signed   By: Misty Stanley M.D.   On: 12/24/2020 07:10     Assessment and Plan:   NSTEMI -In setting of noncompliance with medications and hypertensive  urgency -Symptoms concerning for unstable angina -Hs-troponin  158 >>171  >> cycle troponin  -Continue IV heparin and IV nitroglycerin -He has received aspirin 324 mg this morning>> start aspirin 81 mg daily -Start Lipitor 80 mg daily -Start carvedilol 3.125 mg twice daily -Plan cardiac catheterization tomorrow -Update echocardiogram  2.  Hypertensive urgency -Blood pressure improving on nitroglycerin drip >> slowly wean off -Start carvedilol 3.25 mg daily -Hold lisinopril for cardiac catheterization tomorrow  3.  Hyperlipidemia -Check lipid panel -Lipitor 80 mg daily  4.  Diabetes mellitus -Blood sugar running high -Check hemoglobin A1c -Management by attending team  5.  Alcohol abuse -CIWA protocol recommended  6.  Marijuana smoking -Recommended cessation - Check UDS  7. AKI -Creatinine was normal in 2017,  1.56 today -Will hydrate overnight -Hold lisinopril, resume post cath based on renal function  Risk Assessment/Risk Scores:   TIMI Risk Score for Unstable Angina or Non-ST Elevation MI:   The patient's TIMI risk score is 4, which indicates a 20% risk of all cause mortality, new or recurrent myocardial infarction or need for urgent revascularization in the next 14 days.   For questions or updates, please contact [place] Please consult [email or web address] for contact info under    Jarrett Soho, [place]  12/24/2020 12:37 PM   Patient seen and examined and agree with Robbie Lis, PA as detailed above.  In brief, the patient is a 60 year old male with history of HTN, HLD, DMII, and multivessel CAD with 70% mid RCA, 95% OM1, 90% distal LCx, 90% OM3, 80% prox LAD and 90% ost D1 with normal LV function s/p PCI to LAD, left PLA,   OM1 who presents to the ER with chest pain found to have NSTEMI for which Cardiology has been consulted.  Patient has known history of multivessel CAD as detailed above. CABG was discussed, however, given young age and preserved LV  systolic function, the decision was made to proceed with PCI to LAD, left PLA, OM1. FFR of RCA was 0.82 and was therefore managed medically. The patient was doing well until about 1 week ago when he developed worsening exertional chest discomfort. Notably, he has stopped his medications. In the ER, trop 15>171 consistent with NSTEMI. ECG with LVH with strain pattern (unchanged from prior), biatrial enlargement. Plan for coronary angiography for further evaluation.  GEN: No acute distress.   Neck: No JVD Cardiac: RRR, no murmurs, rubs, or gallops.  Respiratory: Clear to auscultation bilaterally. GI: Soft, nontender, non-distended  MS: No edema; No deformity. Neuro:  Nonfocal  Psych: Normal affect    Plan: -Plan for coronary angiography tomorrow; keep NPO at MN -Check TTE -Continue heparin gtt -Continue nitro gtt -S/p ASA 324mg  in ED, start ASA 81mg  daily -Continue lipitor 80mg  daily -Continue coreg 3.125mg  BID; up-titrate as tolerated -DMII management per primary team -Will add spiro/ACE vs ARB post-cath as tolerated -Check lipid panel and A1C  INFORMED CONSENT: I have reviewed the risks, indications, and alternatives to cardiac catheterization, possible angioplasty, and stenting with the patient. Risks include but are not limited to bleeding, infection, vascular injury, stroke, myocardial infection, arrhythmia, kidney injury, radiation-related injury in the case of prolonged fluoroscopy use, emergency cardiac surgery, and death. The patient understands the risks of serious complication is 1-2 in 1194 with diagnostic cardiac cath and 1-2% or less with angioplasty/stenting.    Gwyndolyn Kaufman, MD

## 2020-12-24 NOTE — Progress Notes (Signed)
ANTICOAGULATION CONSULT NOTE   Pharmacy Consult for Heparin Indication: chest pain/ACS  Allergies  Allergen Reactions   Aspirin Other (See Comments)    Upset stomach      Patient Measurements: Height: 5' 7.5" (171.5 cm) Weight: 101.6 kg (224 lb 0.9 oz) IBW/kg (Calculated) : 67.25 Heparin Dosing Weight: 89.3 kg  Vital Signs: Temp: 98.2 F (36.8 C) (11/14 0658) BP: 129/84 (11/14 1520) Pulse Rate: 85 (11/14 1520)  Labs: Recent Labs    12/24/20 0651 12/24/20 0851 12/24/20 1152 12/24/20 1352 12/24/20 1558  HGB 16.2  --   --   --   --   HCT 46.4  --   --   --   --   PLT 242  --   --   --   --   HEPARINUNFRC  --   --   --   --  0.18*  CREATININE 1.56*  --   --   --   --   TROPONINIHS 158* 171* 215* 233*  --     Estimated Creatinine Clearance: 57.7 mL/min (A) (by C-G formula based on SCr of 1.56 mg/dL (H)).  Medical History: Past Medical History:  Diagnosis Date   CAD (coronary artery disease)    a. 03/2015 NSTEMI: LHC with severe 3V CAD  (70% mid RCA, 95% OM1, 90% distal LCx, 90% OM3, 80% prox LAD and 90% ost D1) s/p DES to mLAD w/ small dissction Rx with DES, staged ost Ramus PCI/DES and dLCx s/p PCI/DES    Diabetes mellitus type 2 in obese ([facility])    Diverticulosis    Hypercholesteremia    Hypertension    Obesity    Tobacco abuse    Medications:  Scheduled:   [START ON 12/25/2020] aspirin EC  81 mg Oral Daily   atorvastatin  80 mg Oral Daily   carvedilol  3.125 mg Oral BID WC   insulin aspart  0-15 Units Subcutaneous TID WC   insulin aspart  0-5 Units Subcutaneous QHS   insulin glargine-yfgn  15 Units Subcutaneous QHS   sodium chloride flush  3 mL Intravenous Q12H   Infusions:   heparin 1,000 Units/hr (12/24/20 0941)   nitroGLYCERIN 40 mcg/min (12/24/20 1350)   Assessment: 60 yr old male to ED with ShOB, substernal chest pain x 4 days. Hx 2017 NSTEMI - stent x3. Currently on ASA, Lisinopril; stated not taking Carvedilol, Atorvastatin. No anti-coagulation  PTA  Heparin infusion for ACS   12/24/20 Heparin level = 0.18 (subtherapeutic) with heparin gtt @ 1000 units/hr No complications of therapy noted   Goal of Therapy:  Heparin level 0.3-0.7 units/ml Monitor platelets by anticoagulation protocol: Yes   Plan:  Increase heparin gtt to 1250 units/hr (14 units/kg/hr) Check Heparin level 6 hr after heparin rate increase Daily CBC, order daily Heparin level at steady state   Johnthomas Lader, Toribio Harbour PharmD 12/24/2020,5:15 PM

## 2020-12-24 NOTE — Progress Notes (Signed)
  Echocardiogram 2D Echocardiogram has been performed .   [name] M 12/24/2020, 2:03 PM

## 2020-12-25 ENCOUNTER — Encounter (HOSPITAL_COMMUNITY)
Admission: EM | Disposition: A | Discharge status: Home / Self Care | Payer: Self-pay | Source: Home / Self Care | Attending: Internal Medicine

## 2020-12-25 DIAGNOSIS — I2510 Atherosclerotic heart disease of native coronary artery without angina pectoris: Secondary | ICD-10-CM | POA: Diagnosis not present

## 2020-12-25 DIAGNOSIS — I214 Non-ST elevation (NSTEMI) myocardial infarction: Secondary | ICD-10-CM | POA: Diagnosis not present

## 2020-12-25 DIAGNOSIS — R079 Chest pain, unspecified: Secondary | ICD-10-CM | POA: Diagnosis not present

## 2020-12-25 DIAGNOSIS — N179 Acute kidney failure, unspecified: Secondary | ICD-10-CM

## 2020-12-25 DIAGNOSIS — I25110 Atherosclerotic heart disease of native coronary artery with unstable angina pectoris: Secondary | ICD-10-CM

## 2020-12-25 HISTORY — PX: LEFT HEART CATH AND CORONARY ANGIOGRAPHY: CATH118249

## 2020-12-25 LAB — CBC
HCT: 42.4 % (ref 39.0–52.0)
Hemoglobin: 14.5 g/dL (ref 13.0–17.0)
MCH: 31.5 pg (ref 26.0–34.0)
MCHC: 34.2 g/dL (ref 30.0–36.0)
MCV: 92 fL (ref 80.0–100.0)
Platelets: 250 10*3/uL (ref 150–400)
RBC: 4.61 MIL/uL (ref 4.22–5.81)
RDW: 12 % (ref 11.5–15.5)
WBC: 12.7 10*3/uL — ABNORMAL HIGH (ref 4.0–10.5)
nRBC: 0 % (ref 0.0–0.2)

## 2020-12-25 LAB — LIPID PANEL
Cholesterol: 260 mg/dL — ABNORMAL HIGH (ref 0–200)
HDL: 48 mg/dL (ref >40)
LDL Cholesterol: 165 mg/dL — ABNORMAL HIGH (ref 0–99)
Total CHOL/HDL Ratio: 5.4 RATIO
Triglycerides: 235 mg/dL — ABNORMAL HIGH (ref <150)
VLDL: 47 mg/dL — ABNORMAL HIGH (ref 0–40)

## 2020-12-25 LAB — COMPREHENSIVE METABOLIC PANEL
ALT: 32 U/L (ref 0–44)
AST: 22 U/L (ref 15–41)
Albumin: 3.2 g/dL — ABNORMAL LOW (ref 3.5–5.0)
Alkaline Phosphatase: 95 U/L (ref 38–126)
Anion gap: 8 (ref 5–15)
BUN: 20 mg/dL (ref 6–20)
CO2: 27 mmol/L (ref 22–32)
Calcium: 9.3 mg/dL (ref 8.9–10.3)
Chloride: 99 mmol/L (ref 98–111)
Creatinine, Ser: 1.83 mg/dL — ABNORMAL HIGH (ref 0.61–1.24)
GFR, Estimated: 42 mL/min — ABNORMAL LOW (ref >60)
Glucose, Bld: 279 mg/dL — ABNORMAL HIGH (ref 70–99)
Potassium: 3.8 mmol/L (ref 3.5–5.1)
Sodium: 134 mmol/L — ABNORMAL LOW (ref 135–145)
Total Bilirubin: 0.8 mg/dL (ref 0.3–1.2)
Total Protein: 6.3 g/dL — ABNORMAL LOW (ref 6.5–8.1)

## 2020-12-25 LAB — GLUCOSE, CAPILLARY
Glucose-Capillary: 242 mg/dL — ABNORMAL HIGH (ref 70–99)
Glucose-Capillary: 182 mg/dL — ABNORMAL HIGH (ref 70–99)
Glucose-Capillary: 188 mg/dL — ABNORMAL HIGH (ref 70–99)
Glucose-Capillary: 358 mg/dL — ABNORMAL HIGH (ref 70–99)

## 2020-12-25 LAB — HEMOGLOBIN A1C
Hgb A1c MFr Bld: 9.9 % — ABNORMAL HIGH (ref 4.8–5.6)
Mean Plasma Glucose: 237.43 mg/dL

## 2020-12-25 LAB — BASIC METABOLIC PANEL
Anion gap: 5 (ref 5–15)
BUN: 16 mg/dL (ref 6–20)
CO2: 30 mmol/L (ref 22–32)
Calcium: 8.8 mg/dL — ABNORMAL LOW (ref 8.9–10.3)
Chloride: 101 mmol/L (ref 98–111)
Creatinine, Ser: 1.77 mg/dL — ABNORMAL HIGH (ref 0.61–1.24)
GFR, Estimated: 43 mL/min — ABNORMAL LOW (ref >60)
Glucose, Bld: 164 mg/dL — ABNORMAL HIGH (ref 70–99)
Potassium: 3.5 mmol/L (ref 3.5–5.1)
Sodium: 136 mmol/L (ref 135–145)

## 2020-12-25 LAB — HEPARIN LEVEL (UNFRACTIONATED)
Heparin Unfractionated: 0.25 IU/mL — ABNORMAL LOW (ref 0.30–0.70)
Heparin Unfractionated: 0.38 IU/mL (ref 0.30–0.70)

## 2020-12-25 LAB — HIV ANTIBODY (ROUTINE TESTING W REFLEX): HIV Screen 4th Generation wRfx: NONREACTIVE

## 2020-12-25 SURGERY — LEFT HEART CATH AND CORONARY ANGIOGRAPHY
Anesthesia: LOCAL

## 2020-12-25 MED ORDER — ATORVASTATIN CALCIUM 80 MG PO TABS: 80.0000 mg | ORAL_TABLET | Freq: Every day | ORAL | Status: DC

## 2020-12-25 MED ORDER — NITROGLYCERIN 1 MG/10 ML FOR IR/CATH LAB
INTRA_ARTERIAL | Status: AC
  Filled 2020-12-25: qty 10

## 2020-12-25 MED ORDER — MIDAZOLAM HCL 2 MG/2ML IJ SOLN
INTRAMUSCULAR | Status: DC | PRN
  Administered 2020-12-25 (×2): 1 mg via INTRAVENOUS

## 2020-12-25 MED ORDER — FENTANYL CITRATE (PF) 100 MCG/2ML IJ SOLN
INTRAMUSCULAR | Status: DC | PRN
  Administered 2020-12-25 (×2): 25 ug via INTRAVENOUS

## 2020-12-25 MED ORDER — SODIUM CHLORIDE 0.9 % IV SOLN: INTRAVENOUS | Status: AC

## 2020-12-25 MED ORDER — LABETALOL HCL 5 MG/ML IV SOLN: 10.0000 mg | INTRAVENOUS | Status: AC | PRN

## 2020-12-25 MED ORDER — VERAPAMIL HCL 2.5 MG/ML IV SOLN
INTRAVENOUS | Status: AC
  Filled 2020-12-25: qty 2

## 2020-12-25 MED ORDER — VERAPAMIL HCL 2.5 MG/ML IV SOLN
INTRAVENOUS | Status: DC | PRN
  Administered 2020-12-25: 2 mg via INTRA_ARTERIAL

## 2020-12-25 MED ORDER — HEPARIN SODIUM (PORCINE) 5000 UNIT/ML IJ SOLN
5000.0000 [IU] | Freq: Three times a day (TID) | INTRAMUSCULAR | Status: DC
  Administered 2020-12-25 – 2020-12-26 (×2): 5000 [IU] via SUBCUTANEOUS
  Filled 2020-12-25 (×2): qty 1

## 2020-12-25 MED ORDER — NITROGLYCERIN 1 MG/10 ML FOR IR/CATH LAB
INTRA_ARTERIAL | Status: DC | PRN
  Administered 2020-12-25: 200 ug via INTRA_ARTERIAL

## 2020-12-25 MED ORDER — SODIUM CHLORIDE 0.9 % IV SOLN: 250.0000 mL | INTRAVENOUS | Status: DC | PRN

## 2020-12-25 MED ORDER — LIDOCAINE HCL (PF) 1 % IJ SOLN
INTRAMUSCULAR | Status: AC
  Filled 2020-12-25: qty 30

## 2020-12-25 MED ORDER — DIAZEPAM 5 MG PO TABS: 5.0000 mg | ORAL_TABLET | Freq: Four times a day (QID) | ORAL | Status: DC | PRN

## 2020-12-25 MED ORDER — IOHEXOL 350 MG/ML SOLN
INTRAVENOUS | Status: DC | PRN
  Administered 2020-12-25: 25 mL via INTRA_ARTERIAL

## 2020-12-25 MED ORDER — HEPARIN SODIUM (PORCINE) 1000 UNIT/ML IJ SOLN
INTRAMUSCULAR | Status: AC
  Filled 2020-12-25: qty 1

## 2020-12-25 MED ORDER — LIDOCAINE HCL (PF) 1 % IJ SOLN
INTRAMUSCULAR | Status: DC | PRN
  Administered 2020-12-25: 2 mL via INTRADERMAL

## 2020-12-25 MED ORDER — HEPARIN SODIUM (PORCINE) 1000 UNIT/ML IJ SOLN
INTRAMUSCULAR | Status: DC | PRN
  Administered 2020-12-25: 5000 [IU] via INTRAVENOUS

## 2020-12-25 MED ORDER — MIDAZOLAM HCL 2 MG/2ML IJ SOLN
INTRAMUSCULAR | Status: AC
  Filled 2020-12-25: qty 2

## 2020-12-25 MED ORDER — SODIUM CHLORIDE 0.9% FLUSH: 3.0000 mL | INTRAVENOUS | Status: DC | PRN

## 2020-12-25 MED ORDER — POLYETHYLENE GLYCOL 3350 17 G PO PACK
34.0000 g | PACK | Freq: Once | ORAL | Status: AC
  Administered 2020-12-25: 34 g via ORAL
  Filled 2020-12-25: qty 2

## 2020-12-25 MED ORDER — SODIUM CHLORIDE 0.9% FLUSH
3.0000 mL | Freq: Two times a day (BID) | INTRAVENOUS | Status: DC
  Administered 2020-12-27 – 2021-01-01 (×3): 3 mL via INTRAVENOUS

## 2020-12-25 MED ORDER — ACETAMINOPHEN 325 MG PO TABS
650.0000 mg | ORAL_TABLET | ORAL | Status: DC | PRN
  Administered 2020-12-26: 650 mg via ORAL
  Filled 2020-12-25: qty 2

## 2020-12-25 MED ORDER — HYDRALAZINE HCL 20 MG/ML IJ SOLN: 10.0000 mg | INTRAMUSCULAR | Status: AC | PRN

## 2020-12-25 MED ORDER — FENTANYL CITRATE (PF) 100 MCG/2ML IJ SOLN
INTRAMUSCULAR | Status: AC
  Filled 2020-12-25: qty 2

## 2020-12-25 MED ORDER — HEPARIN (PORCINE) IN NACL 1000-0.9 UT/500ML-% IV SOLN
INTRAVENOUS | Status: DC | PRN
  Administered 2020-12-25 (×2): 500 mL

## 2020-12-25 MED ORDER — HEPARIN (PORCINE) IN NACL 1000-0.9 UT/500ML-% IV SOLN
INTRAVENOUS | Status: AC
  Filled 2020-12-25: qty 1000

## 2020-12-25 MED ORDER — CARVEDILOL 6.25 MG PO TABS
6.2500 mg | ORAL_TABLET | Freq: Two times a day (BID) | ORAL | Status: DC
  Administered 2020-12-25 – 2020-12-26 (×3): 6.25 mg via ORAL
  Filled 2020-12-25 (×3): qty 1

## 2020-12-25 MED ORDER — ONDANSETRON HCL 4 MG/2ML IJ SOLN: 4.0000 mg | Freq: Four times a day (QID) | INTRAMUSCULAR | Status: DC | PRN

## 2020-12-25 MED ORDER — VERAPAMIL HCL 2.5 MG/ML IV SOLN
INTRAVENOUS | Status: DC | PRN
  Administered 2020-12-25 (×2): 10 mL via INTRA_ARTERIAL

## 2020-12-25 SURGICAL SUPPLY — 14 items
CATH INFINITI 4FR JL3.5 (CATHETERS) ×1 IMPLANT
CATH INFINITI 5 FR JL3.5 (CATHETERS) ×1 IMPLANT
CATH LAUNCHER 5F EBU4.0 (CATHETERS) ×1 IMPLANT
CATH LAUNCHER 5F JL4 (CATHETERS) IMPLANT
CATH OPTITORQUE TIG 4.0 5F (CATHETERS) ×1 IMPLANT
CATHETER LAUNCHER 5F JL4 (CATHETERS) ×2
DEVICE RAD COMP TR BAND LRG (VASCULAR PRODUCTS) ×1 IMPLANT
GLIDESHEATH SLEND SS 6F .021 (SHEATH) ×1 IMPLANT
GUIDEWIRE INQWIRE 1.5J.035X260 (WIRE) IMPLANT
INQWIRE 1.5J .035X260CM (WIRE) ×2
KIT HEART LEFT (KITS) ×2 IMPLANT
PACK CARDIAC CATHETERIZATION (CUSTOM PROCEDURE TRAY) ×2 IMPLANT
TRANSDUCER W/STOPCOCK (MISCELLANEOUS) ×2 IMPLANT
TUBING CIL FLEX 10 FLL-RA (TUBING) ×2 IMPLANT

## 2020-12-25 NOTE — Progress Notes (Signed)
ANTICOAGULATION CONSULT NOTE - Follow Up Consult  Pharmacy Consult for heparin Indication:  NSTEMI  Labs: Recent Labs    12/24/20 0651 12/24/20 0851 12/24/20 1152 12/24/20 1352 12/24/20 1558 12/25/20 0031  HGB 16.2  --   --   --   --  14.5  HCT 46.4  --   --   --   --  42.4  PLT 242  --   --   --   --  250  HEPARINUNFRC  --   --   --   --  0.18* 0.25*  CREATININE 1.56*  --   --   --   --  1.83*  TROPONINIHS 158* 171* 215* 233*  --   --     Assessment: 60yo male remains subtherapeutic on heparin after rate change though approaching goal; no infusion issues or signs of bleeding per RN.  Goal of Therapy:  Heparin level 0.3-0.7 units/ml   Plan:  Will increase heparin infusion by 1-2 units/kg/hr to 1400 units/hr and check level in 8 hours.    Wynona Neat, PharmD, BCPS  12/25/2020,2:10 AM

## 2020-12-25 NOTE — Interval H&P Note (Signed)
Cath Lab Visit (complete for each Cath Lab visit)  Clinical Evaluation Leading to the Procedure:   ACS: Yes.    Non-ACS:    Anginal Classification: CCS III  Anti-ischemic medical therapy: Minimal Therapy (1 class of medications)  Non-Invasive Test Results: No non-invasive testing performed  Prior CABG: No previous CABG      History and Physical Interval Note:  12/25/2020 4:16 PM  [name]  has presented today for surgery, with the diagnosis of nstemi.  The various methods of treatment have been discussed with the patient and family. After consideration of risks, benefits and other options for treatment, the patient has consented to  Procedure(s): LEFT HEART CATH AND CORONARY ANGIOGRAPHY (N/A) as a surgical intervention.  The patient's history has been reviewed, patient examined, no change in status, stable for surgery.  I have reviewed the patient's chart and labs.  Questions were answered to the patient's satisfaction.     [name]

## 2020-12-25 NOTE — Progress Notes (Signed)
PROGRESS NOTE  [name] OJJ:[id number] DOB: May 07, 1960 DOA: 12/24/2020 PCP: Patient, No Pcp Per (Inactive)  Brief History   The patient is a 60 yr old man who presented with complaints of midsternal chest pain/achiness that was accompanied by dyspnea. He stated that it was similar to the feeling he had when he had an MI.   In the ED he was found to be hypertensive. He was placed on a nitroglyceride drip. Cardiology was consulted. [facility] were consulted to admit the patient for further evaluation and care.   The patient has been admitted to a telemetry bed.   Plan is for [facility] today.  Consultants  Cardiology  Procedures  None  Antibiotics   Anti-infectives (From admission, onward)    None      Subjective  The patient is resting comfortably. No new complaints.  Objective   Vitals:  Vitals:   12/25/20 0751 12/25/20 1156  BP: (!) 149/90 139/66  Pulse: 100 94  Resp: 20 20  Temp: 97.8 F (36.6 C) 98 F (36.7 C)  SpO2: 92% 91%    Exam:  Constitutional:  The patient is awake, alert, and oriented x 3. No acute distress. Respiratory:  No increased work of breathing. No wheezes, rales, or rhonchi No tactile fremitus Cardiovascular:  Regular rate and rhythm No murmurs, ectopy, or gallups. No lateral PMI. No thrills. Abdomen:  Abdomen is soft, non-tender, non-distended No hernias, masses, or organomegaly Normoactive bowel sounds.  Musculoskeletal:  No cyanosis, clubbing, or edema Skin:  No rashes, lesions, ulcers palpation of skin: no induration or nodules Neurologic:  CN 2-12 intact Sensation all 4 extremities intact Psychiatric:  Mental status Mood, affect appropriate Orientation to person, place, time  judgment and insight appear intact  I have personally reviewed the following:   Today's Data  Vitals  Lab Data  BMP, troponins  Imaging  CXR  Cardiology Data  EKG Echocardiogram  Scheduled Meds:  aspirin EC  81 mg Oral Daily    atorvastatin  80 mg Oral Daily   carvedilol  6.25 mg Oral BID WC   folic acid  1 mg Oral Daily   insulin aspart  0-15 Units Subcutaneous TID WC   insulin aspart  0-5 Units Subcutaneous QHS   insulin glargine-yfgn  15 Units Subcutaneous QHS   multivitamin with minerals  1 tablet Oral Daily   sodium chloride flush  3 mL Intravenous Q12H   thiamine  100 mg Oral Daily   Or   thiamine  100 mg Intravenous Daily   Continuous Infusions:  sodium chloride     sodium chloride 1 mL/kg/hr (12/25/20 0754)   heparin 1,400 Units/hr (12/25/20 0530)   nitroGLYCERIN 46.667 mcg/min (12/25/20 9371)    Active Problems:   Chest pain   LOS: 1 day   A & P  Assessment/Plan Chest pain Elevated Troponin NSTEMI     - admit to inpt, progressive @ [facility]     - cardiology consulted, appreciate assistance     - NPO pMN     - going for LHC later today     - continue NTG gtt, heparin gtt     - trend trp     - echo ordered     - ASA, statin, coreg     - holding lisinopril (AKI)   HTN urgency     - continue NTG gtt; wean as able     - add coreg   AKI     - multifactorial      -  check renal US     - hold lisinopril; watch nephrotoxins   DM2     - SSI, glucose checks, A1c, DM diet for now   HLD CAD     - ASA, statin, BB   Tobacco abuse EtOH abuse Marijuana abuse     - counseled against further use     - CIWA  I have seen and examined this patient myself.    DVT prophylaxis: heparin gtt  Code Status: FULL  Family Communication: None at bedside  Consults called: EDP spoke with cardiology    Heron Pitcock, DO [facility] Direct contact: see [email or web address]  7PM-7AM contact night coverage as above 12/25/2020, 1:16 PM  LOS: 1 day

## 2020-12-25 NOTE — H&P (View-Only) (Signed)
Progress Note  Patient Name: [name] Date of Encounter: 12/26/2020  [facility] Cardiologist: Freada Bergeron, MD   Subjective   Anxious to go home. Denies chest pain. Plan for cath today.  Cath with 90% prox-mid RCA, 70% RV marginal branch 1 and 80% RV marginal branch 2, with mild distal disease. Unable to engage left coronary due to radial artery spasm and innominate vessel tortuosity. Planned for repeat cath today.   Inpatient Medications    Scheduled Meds:  aspirin EC  81 mg Oral Daily   atorvastatin  80 mg Oral Daily   carvedilol  12.5 mg Oral BID WC   folic acid  1 mg Oral Daily   heparin  5,000 Units Subcutaneous Q8H   insulin aspart  0-15 Units Subcutaneous TID WC   insulin aspart  0-5 Units Subcutaneous QHS   insulin glargine-yfgn  15 Units Subcutaneous QHS   multivitamin with minerals  1 tablet Oral Daily   sodium chloride flush  3 mL Intravenous Q12H   sodium chloride flush  3 mL Intravenous Q12H   thiamine  100 mg Oral Daily   Or   thiamine  100 mg Intravenous Daily   Continuous Infusions:  sodium chloride     sodium chloride 1 mL/kg/hr (12/26/20 0729)   nitroGLYCERIN 46.667 mcg/min (12/26/20 0131)   PRN Meds: sodium chloride, acetaminophen, diazepam, LORazepam **OR** LORazepam, ondansetron (ZOFRAN) IV, sodium chloride flush   Vital Signs    Vitals:   12/25/20 1953 12/25/20 2222 12/26/20 0132 12/26/20 0635  BP: 138/74 135/77  (!) 140/99  Pulse:    94  Resp: 20 20  18   Temp:  98.6 F (37 C)  98.5 F (36.9 C)  TempSrc:  Oral  Oral  SpO2: 93% 96%  93%  Weight:   103.7 kg   Height:        Intake/Output Summary (Last 24 hours) at 12/26/2020 0941 Last data filed at 12/26/2020 0810 Gross per 24 hour  Intake 1064.15 ml  Output 250 ml  Net 814.15 ml   Last 3 Weights 12/26/2020 12/25/2020 12/24/2020  Weight (lbs) 228 lb 9.6 oz 225 lb 11.2 oz 224 lb 0.9 oz  Weight (kg) 103.692 kg 102.377 kg 101.631 kg      Telemetry    NSR -  Personally Reviewed  ECG     No new tracing- Personally Reviewed  Physical Exam   GEN: No acute distress. Comfortable  Neck: No JVD Cardiac: RRR, no murmurs, rubs, or gallops.  Respiratory: CTAB GI: Soft, nontender, non-distended  MS: No edema; No deformity. Right radial cath site c/d/I without hematoma Neuro:  Nonfocal  Psych: Normal affect   Labs    High Sensitivity Troponin:   Recent Labs  Lab 12/24/20 0651 12/24/20 0851 12/24/20 1152 12/24/20 1352  TROPONINIHS 158* 171* 215* 233*     Chemistry Recent Labs  Lab 12/24/20 0651 12/25/20 0031 12/25/20 1046 12/26/20 0232  NA 132* 134* 136 133*  K 4.0 3.8 3.5 3.8  CL 96* 99 101 101  CO2 27 27 30 26   GLUCOSE 432* 279* 164* 248*  BUN 17 20 16 14   CREATININE 1.56* 1.83* 1.77* 1.63*  CALCIUM 9.3 9.3 8.8* 8.2*  PROT 7.4 6.3*  --   --   ALBUMIN 3.9 3.2*  --   --   AST 32 22  --   --   ALT 42 32  --   --   ALKPHOS 131* 95  --   --  BILITOT 0.5 0.8  --   --   GFRNONAA 51* 42* 43* 48*  ANIONGAP 9 8 5 6     Lipids  Recent Labs  Lab 12/25/20 0031  CHOL 260*  TRIG 235*  HDL 48  LDLCALC 165*  CHOLHDL 5.4    Hematology Recent Labs  Lab 12/24/20 0651 12/25/20 0031 12/26/20 0232  WBC 10.8* 12.7* 9.0  RBC 5.11 4.61 4.04*  HGB 16.2 14.5 12.8*  HCT 46.4 42.4 37.4*  MCV 90.8 92.0 92.6  MCH 31.7 31.5 31.7  MCHC 34.9 34.2 34.2  RDW 12.0 12.0 12.0  PLT 242 250 210   Thyroid No results for input(s): TSH, FREET4 in the last 168 hours.  BNP Recent Labs  Lab 12/24/20 0651  BNP 412.0*    DDimer No results for input(s): DDIMER in the last 168 hours.   Radiology    CARDIAC CATHETERIZATION  Result Date: 12/25/2020   Prox RCA lesion is 90% stenosed.   RV Branch-1 lesion is 70% stenosed.   RV Branch-2 lesion is 80% stenosed.   Prox RCA to Mid RCA lesion is 90% stenosed.   Mid RCA lesion is 20% stenosed.   Mid RCA to Dist RCA lesion is 30% stenosed.   2nd RPL lesion is 50% stenosed. There is severe CAD in a  dominant right coronary artery with 90% proximal and mid stenoses, diffuse 70 and 80% stenoses in an RV marginal branch, and mid distal 20 and 30% stenoses. Due to innominate vessel tortuosity as well as significant radial vasospasm, the left coronary system was unable to be cannulated despite attempts with multiple catheters and additional verapamil, intra-arterial nitroglycerin and IV nitroglycerin. Will initiate IV heparin 8 hours post procedure and plan for completion of the diagnostic catheterization to the left coronary system tomorrow via the femoral approach and plan for probable PCI to the RCA depending upon left coronary findings.   US RENAL  Result Date: 12/24/2020 CLINICAL DATA:  AKI EXAM: RENAL / URINARY TRACT ULTRASOUND COMPLETE COMPARISON:  None. FINDINGS: Right Kidney: Renal measurements: 9.9 x 4.3 x 4.9cm = volume: 110 mL. Echogenicity within normal limits. No mass or hydronephrosis visualized. Left Kidney: Renal measurements: 11.2 x 5.3 x 4.3cm = volume: 133 mL. Echogenicity within normal limits. No mass or hydronephrosis visualized. Urinary bladder: Appears normal for degree of bladder distention. Other: Likely hepatic steatosis. IMPRESSION: 1. Likely hepatic steatosis. 2. Unremarkable renal ultrasound. Electronically Signed   By: Iven Finn M.D.   On: 12/24/2020 17:31   ECHOCARDIOGRAM COMPLETE  Result Date: 12/24/2020    ECHOCARDIOGRAM REPORT   Patient Name:   [name] Date of Exam: 12/24/2020 Medical Rec #:  [id number]  Height:       67.5 in Accession #:    [id number] Weight:       224.1 lb Date of Birth:  1960-12-03  BSA:          2.134 m Patient Age:    60 years   BP:           129/84 mmHg Patient Gender: M          HR:           85 bpm. Exam Location:  Inpatient Procedure: 2D Echo, Color Doppler, Cardiac Doppler and Intracardiac            Opacification Agent Indications:    NSTEMI I21.4  History:        Patient has prior history of Echocardiogram examinations, most  recent 03/21/2015. CAD; Risk Factors:Hypertension, Diabetes,                 Dyslipidemia and Current Smoker.  Sonographer:    Darlina Sicilian RDCS Referring Phys: [id number] [place]  1. Left ventricular ejection fraction, by estimation, is 55 to 60%. The left ventricle has normal function. The left ventricle demonstrates regional wall motion abnormalities (see scoring diagram/findings for description). Left ventricular diastolic parameters are consistent with Grade I diastolic dysfunction (impaired relaxation).  2. Right ventricular systolic function is normal. The right ventricular size is normal.  3. The mitral valve is normal in structure. Mild mitral valve regurgitation. No evidence of mitral stenosis.  4. The aortic valve is normal in structure. Aortic valve regurgitation is not visualized. Aortic valve sclerosis is present, with no evidence of aortic valve stenosis.  5. The inferior vena cava is normal in size with greater than 50% respiratory variability, suggesting right atrial pressure of 3 mmHg. FINDINGS  Left Ventricle: Left ventricular ejection fraction, by estimation, is 55 to 60%. The left ventricle has normal function. The left ventricle demonstrates regional wall motion abnormalities. Definity contrast agent was given IV to delineate the left ventricular endocardial borders. The left ventricular internal cavity size was normal in size. There is no left ventricular hypertrophy. Left ventricular diastolic parameters are consistent with Grade I diastolic dysfunction (impaired relaxation).  LV Wall Scoring: The basal inferior segment is hypokinetic. The entire anterior wall, antero-lateral wall, mid and distal lateral wall, mid and distal anterior septum, inferior septum, entire apex, and mid and distal inferior wall are normal. Right Ventricle: The right ventricular size is normal. No increase in right ventricular wall thickness. Right ventricular systolic function is normal.  Left Atrium: Left atrial size was normal in size. Right Atrium: Right atrial size was normal in size. Pericardium: There is no evidence of pericardial effusion. Mitral Valve: The mitral valve is normal in structure. Mild mitral annular calcification. Mild mitral valve regurgitation. No evidence of mitral valve stenosis. Tricuspid Valve: The tricuspid valve is normal in structure. Tricuspid valve regurgitation is not demonstrated. No evidence of tricuspid stenosis. Aortic Valve: The aortic valve is normal in structure. Aortic valve regurgitation is not visualized. Aortic valve sclerosis is present, with no evidence of aortic valve stenosis. Pulmonic Valve: The pulmonic valve was normal in structure. Pulmonic valve regurgitation is not visualized. No evidence of pulmonic stenosis. Aorta: The aortic root is normal in size and structure. Venous: The inferior vena cava was not well visualized. The inferior vena cava is normal in size with greater than 50% respiratory variability, suggesting right atrial pressure of 3 mmHg. IAS/Shunts: No atrial level shunt detected by color flow Doppler.  LEFT VENTRICLE PLAX 2D LVIDd:         4.20 cm      Diastology LVIDs:         3.60 cm      LV e' medial:    3.75 cm/s LV PW:         1.30 cm      LV E/e' medial:  16.6 LV IVS:        1.10 cm      LV e' lateral:   3.15 cm/s LVOT diam:     2.20 cm      LV E/e' lateral: 19.7 LV SV:         49 LV SV Index:   23 LVOT Area:     3.80 cm  LV Volumes (MOD)  LV vol d, MOD A2C: 85.2 ml LV vol d, MOD A4C: 132.0 ml LV vol s, MOD A2C: 34.4 ml LV vol s, MOD A4C: 64.4 ml LV SV MOD A2C:     50.8 ml LV SV MOD A4C:     132.0 ml LV SV MOD BP:      59.9 ml LEFT ATRIUM             Index LA diam:        3.80 cm 1.78 cm/m LA Vol (A2C):   38.0 ml 17.81 ml/m LA Vol (A4C):   21.4 ml 10.03 ml/m LA Biplane Vol: 30.3 ml 14.20 ml/m  AORTIC VALVE LVOT Vmax:   74.40 cm/s LVOT Vmean:  52.200 cm/s LVOT VTI:    0.130 m  AORTA Ao Root diam: 3.10 cm MITRAL VALVE MV Area  (PHT): 2.34 cm    SHUNTS MV Decel Time: 324 msec    Systemic VTI:  0.13 m MV E velocity: 62.10 cm/s  Systemic Diam: 2.20 cm MV A velocity: 89.50 cm/s MV E/A ratio:  0.69 Kardie Tobb DO Electronically signed by Berniece Salines DO Signature Date/Time: 12/24/2020/4:04:55 PM    Final     Cardiac Studies  Cath 12/25/20:     Prox RCA lesion is 90% stenosed.   RV Branch-1 lesion is 70% stenosed.   RV Branch-2 lesion is 80% stenosed.   Prox RCA to Mid RCA lesion is 90% stenosed.   Mid RCA lesion is 20% stenosed.   Mid RCA to Dist RCA lesion is 30% stenosed.   2nd RPL lesion is 50% stenosed.   There is severe CAD in a dominant right coronary artery with 90% proximal and mid stenoses, diffuse 70 and 80% stenoses in an RV marginal branch, and mid distal 20 and 30% stenoses.   Due to innominate vessel tortuosity as well as significant radial vasospasm, the left coronary system was unable to be cannulated despite attempts with multiple catheters and additional verapamil, intra-arterial nitroglycerin and IV nitroglycerin.   Will initiate IV heparin 8 hours post procedure and plan for completion of the diagnostic catheterization to the left coronary system tomorrow via the femoral approach and plan for probable PCI to the RCA depending upon left coronary findings.  TTE 12/24/20: IMPRESSIONS   1. Left ventricular ejection fraction, by estimation, is 55 to 60%. The  left ventricle has normal function. The left ventricle demonstrates  regional wall motion abnormalities (see scoring diagram/findings for  description). Left ventricular diastolic  parameters are consistent with Grade I diastolic dysfunction (impaired  relaxation).   2. Right ventricular systolic function is normal. The right ventricular  size is normal.   3. The mitral valve is normal in structure. Mild mitral valve  regurgitation. No evidence of mitral stenosis.   4. The aortic valve is normal in structure. Aortic valve regurgitation is   not visualized. Aortic valve sclerosis is present, with no evidence of  aortic valve stenosis.   5. The inferior vena cava is normal in size with greater than 50%  respiratory variability, suggesting right atrial pressure of 3 mmHg.   Echo 03/21/2015 LV EF: 55% -   60%   -------------------------------------------------------------------  Indications:      CAD of native vessels 414.01.   -------------------------------------------------------------------  History:   PMH:   Myocardial infarction.  Risk factors:  Current  tobacco use. Hypertension. Diabetes mellitus. Dyslipidemia.   -------------------------------------------------------------------  Study Conclusions   - Left ventricle: The cavity size was normal. There was  moderate    concentric hypertrophy. Systolic function was normal. The    estimated ejection fraction was in the range of 55% to 60%.    Possible akinesis of the basalinferior myocardium. Doppler    parameters are consistent with abnormal left ventricular    relaxation (grade 1 diastolic dysfunction).  - Aortic valve: Trileaflet; mildly thickened, mildly calcified    leaflets.   Impressions:   - Compared to the prior study, there has been no significant    interval change.   Coronary Stent Intervention 12/24/20    Conclusion   Mid RCA lesion, 60% stenosed. Ost Ramus to Ramus lesion, 95% stenosed. Post intervention, there is a 0% residual stenosis. Dist Cx lesion, 95% stenosed. Post intervention, there is a 0% residual stenosis.   IMPRESSION:successful staged LAD, first obtuse marginal branch and left PLA branch PCI and drug-eluting stenting using synergy drug-eluting stents with a moderate mid dominant RCA stenosis found not to be physiologically significant. He has preserved LV function. This diagonal branch which was occluded at the time of LAD stenting 2 days ago had mild restoration of flow. He sheath was removed and a TR band was placed on the right  wrist to achieve patent hemostasis. The patient left the lab in stable condition.He'll be hydrated for the next 12 hours, discharged home in the morning on dual antiplatelet therapy and I will see him back in the office in 2-3 weeks.   Diagnostic Dominance: Right Intervention        Patient Profile     60 y.o. 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 Now presenting with classic anginal symptoms in the setting of stopping all medications. Cath showed 90% prox-mid RCA, 70-80% RV marginal branches and mild distal RCA disease. Unable to engage left coronary on cath yesterday due to radial artery vasospasm and innominate artery tortuosity. Now planned for repeat cath today to complete diagnostic evaluation and possible PCI of RCA. -Plan for repeat cath today to complete diagnostic evaluation with possible RCA intervention pending on what left sided system reveals -Continue ASA 81mg  daily -Continue lipitor 80mg  daily -Increase coreg to 12.5mg  BID -Continue nitro gtt for now; transition to ACE/ARB post-cath  #HTN: -On nitro gtt for now -Increase coreg to 12.5mg  BID -Add ACE/ARB post-cath  #HLD: TC 260, LDL 165, TG 235 not on statin -Continue lipitor 80mg  daily -Goal LDL<70 -Will need repeat lipids as out-patient  #DMII: -Management per primary     For questions or updates, please contact [facility] Please consult [email or web address] for contact info under        Signed, Freada Bergeron, MD  12/26/2020, 9:41 AM

## 2020-12-25 NOTE — Progress Notes (Signed)
Inpatient Diabetes Program Recommendations  AACE/ADA: New Consensus Statement on Inpatient Glycemic Control (2015)  Target Ranges:  Prepandial:   less than 140 mg/dL      Peak postprandial:   less than 180 mg/dL (1-2 hours)      Critically ill patients:  140 - 180 mg/dL   Lab Results  Component Value Date   GLUCAP 182 (H) 12/25/2020   HGBA1C 10.5 (H) 12/24/2020    Review of Glycemic Control Results for SEYMORE, BRODOWSKI (MRN [id number]) as of 12/25/2020 15:28  Ref. Range 12/24/2020 21:01 12/25/2020 06:22 12/25/2020 11:57  Glucose-Capillary Latest Ref Range: 70 - 99 mg/dL 291 (H) 242 (H) 182 (H)   Diabetes history: Type 2 DM Outpatient Diabetes medications: none Current orders for Inpatient glycemic control: Semglee 15 units QHS, Novolog 0-15 units TID & HS  Inpatient Diabetes Program Recommendations:    Spoke with patient regarding outpatient diabetes management. Verified that patient had stopped taking all medications because "I felt trapped and wanted to stop. So I started working out and lost a lot of weight. But now I know that was not the right thing to do. I will do what I need to do." Reviewed patient's current A1c of 10.5%. Explained what a A1c is and what it measures. Also reviewed goal A1c with patient, importance of good glucose control @ home, and blood sugar goals. Reviewed patho of DM, need for insulin, role of pancreas, need for improved control, impact from cardiac perspective, vascular changes and commorbidities.  Patient will need a meter at discharge. Blood glucose meter #[id number]. Reviewed recommended frequency of CBG checks and discussed Freestyle Libre. Patient would be interested.  Reviewed plate method, need for portion control, options for alteratives, and importance of being mindful of CHO intake. Dietitian consult and outpatient referral placed.   Thanks, Bronson Curb, MSN, RNC-OB Diabetes Coordinator [phone] (8a-5p)

## 2020-12-25 NOTE — Progress Notes (Signed)
ANTICOAGULATION CONSULT NOTE - Follow Up Consult  Pharmacy Consult for heparin Indication:  NSTEMI  Labs: Recent Labs    12/24/20 0651 12/24/20 0851 12/24/20 1152 12/24/20 1352 12/24/20 1558 12/25/20 0031 12/25/20 1046  HGB 16.2  --   --   --   --  14.5  --   HCT 46.4  --   --   --   --  42.4  --   PLT 242  --   --   --   --  250  --   HEPARINUNFRC  --   --   --   --  0.18* 0.25* 0.38  CREATININE 1.56*  --   --   --   --  1.83* 1.77*  TROPONINIHS 158* 171* 215* 233*  --   --   --      Assessment: 60yo male with current therapeutic heparin level = 0.38 on heparin infusion 1400 units/hr. No bleeding reported . CBC wnl stable  Plan is for cath 11/15  Goal of Therapy:  Heparin level 0.3-0.7 units/ml   Plan:  Continue IV Heparin infusion 1400 units/hr Will follow up post cath today Daily HL, CBC if heparin continues post cath.      Nicole Cella, RPh Clinical Pharmacist [phone] 12/25/2020,11:54 AM  Please check AMION for all [place] phone numbers After 10:00 PM, call [place] [phone]

## 2020-12-25 NOTE — Progress Notes (Addendum)
Progress Note  Patient Name: [name] Date of Encounter: 12/26/2020  [facility] Cardiologist: Freada Bergeron, MD   Subjective   Anxious to go home. Denies chest pain. Plan for cath today.  Cath with 90% prox-mid RCA, 70% RV marginal branch 1 and 80% RV marginal branch 2, with mild distal disease. Unable to engage left coronary due to radial artery spasm and innominate vessel tortuosity. Planned for repeat cath today.   Inpatient Medications    Scheduled Meds:  aspirin EC  81 mg Oral Daily   atorvastatin  80 mg Oral Daily   carvedilol  12.5 mg Oral BID WC   folic acid  1 mg Oral Daily   heparin  5,000 Units Subcutaneous Q8H   insulin aspart  0-15 Units Subcutaneous TID WC   insulin aspart  0-5 Units Subcutaneous QHS   insulin glargine-yfgn  15 Units Subcutaneous QHS   multivitamin with minerals  1 tablet Oral Daily   sodium chloride flush  3 mL Intravenous Q12H   sodium chloride flush  3 mL Intravenous Q12H   thiamine  100 mg Oral Daily   Or   thiamine  100 mg Intravenous Daily   Continuous Infusions:  sodium chloride     sodium chloride 1 mL/kg/hr (12/26/20 0729)   nitroGLYCERIN 46.667 mcg/min (12/26/20 0131)   PRN Meds: sodium chloride, acetaminophen, diazepam, LORazepam **OR** LORazepam, ondansetron (ZOFRAN) IV, sodium chloride flush   Vital Signs    Vitals:   12/25/20 1953 12/25/20 2222 12/26/20 0132 12/26/20 0635  BP: 138/74 135/77  (!) 140/99  Pulse:    94  Resp: 20 20  18   Temp:  98.6 F (37 C)  98.5 F (36.9 C)  TempSrc:  Oral  Oral  SpO2: 93% 96%  93%  Weight:   103.7 kg   Height:        Intake/Output Summary (Last 24 hours) at 12/26/2020 0941 Last data filed at 12/26/2020 0810 Gross per 24 hour  Intake 1064.15 ml  Output 250 ml  Net 814.15 ml   Last 3 Weights 12/26/2020 12/25/2020 12/24/2020  Weight (lbs) 228 lb 9.6 oz 225 lb 11.2 oz 224 lb 0.9 oz  Weight (kg) 103.692 kg 102.377 kg 101.631 kg      Telemetry    NSR -  Personally Reviewed  ECG     No new tracing- Personally Reviewed  Physical Exam   GEN: No acute distress. Comfortable  Neck: No JVD Cardiac: RRR, no murmurs, rubs, or gallops.  Respiratory: CTAB GI: Soft, nontender, non-distended  MS: No edema; No deformity. Right radial cath site c/d/I without hematoma Neuro:  Nonfocal  Psych: Normal affect   Labs    High Sensitivity Troponin:   Recent Labs  Lab 12/24/20 0651 12/24/20 0851 12/24/20 1152 12/24/20 1352  TROPONINIHS 158* 171* 215* 233*     Chemistry Recent Labs  Lab 12/24/20 0651 12/25/20 0031 12/25/20 1046 12/26/20 0232  NA 132* 134* 136 133*  K 4.0 3.8 3.5 3.8  CL 96* 99 101 101  CO2 27 27 30 26   GLUCOSE 432* 279* 164* 248*  BUN 17 20 16 14   CREATININE 1.56* 1.83* 1.77* 1.63*  CALCIUM 9.3 9.3 8.8* 8.2*  PROT 7.4 6.3*  --   --   ALBUMIN 3.9 3.2*  --   --   AST 32 22  --   --   ALT 42 32  --   --   ALKPHOS 131* 95  --   --  BILITOT 0.5 0.8  --   --   GFRNONAA 51* 42* 43* 48*  ANIONGAP 9 8 5 6     Lipids  Recent Labs  Lab 12/25/20 0031  CHOL 260*  TRIG 235*  HDL 48  LDLCALC 165*  CHOLHDL 5.4    Hematology Recent Labs  Lab 12/24/20 0651 12/25/20 0031 12/26/20 0232  WBC 10.8* 12.7* 9.0  RBC 5.11 4.61 4.04*  HGB 16.2 14.5 12.8*  HCT 46.4 42.4 37.4*  MCV 90.8 92.0 92.6  MCH 31.7 31.5 31.7  MCHC 34.9 34.2 34.2  RDW 12.0 12.0 12.0  PLT 242 250 210   Thyroid No results for input(s): TSH, FREET4 in the last 168 hours.  BNP Recent Labs  Lab 12/24/20 0651  BNP 412.0*    DDimer No results for input(s): DDIMER in the last 168 hours.   Radiology    CARDIAC CATHETERIZATION  Result Date: 12/25/2020   Prox RCA lesion is 90% stenosed.   RV Branch-1 lesion is 70% stenosed.   RV Branch-2 lesion is 80% stenosed.   Prox RCA to Mid RCA lesion is 90% stenosed.   Mid RCA lesion is 20% stenosed.   Mid RCA to Dist RCA lesion is 30% stenosed.   2nd RPL lesion is 50% stenosed. There is severe CAD in a  dominant right coronary artery with 90% proximal and mid stenoses, diffuse 70 and 80% stenoses in an RV marginal branch, and mid distal 20 and 30% stenoses. Due to innominate vessel tortuosity as well as significant radial vasospasm, the left coronary system was unable to be cannulated despite attempts with multiple catheters and additional verapamil, intra-arterial nitroglycerin and IV nitroglycerin. Will initiate IV heparin 8 hours post procedure and plan for completion of the diagnostic catheterization to the left coronary system tomorrow via the femoral approach and plan for probable PCI to the RCA depending upon left coronary findings.   US RENAL  Result Date: 12/24/2020 CLINICAL DATA:  AKI EXAM: RENAL / URINARY TRACT ULTRASOUND COMPLETE COMPARISON:  None. FINDINGS: Right Kidney: Renal measurements: 9.9 x 4.3 x 4.9cm = volume: 110 mL. Echogenicity within normal limits. No mass or hydronephrosis visualized. Left Kidney: Renal measurements: 11.2 x 5.3 x 4.3cm = volume: 133 mL. Echogenicity within normal limits. No mass or hydronephrosis visualized. Urinary bladder: Appears normal for degree of bladder distention. Other: Likely hepatic steatosis. IMPRESSION: 1. Likely hepatic steatosis. 2. Unremarkable renal ultrasound. Electronically Signed   By: Iven Finn M.D.   On: 12/24/2020 17:31   ECHOCARDIOGRAM COMPLETE  Result Date: 12/24/2020    ECHOCARDIOGRAM REPORT   Patient Name:   [name] Date of Exam: 12/24/2020 Medical Rec #:  [id number]  Height:       67.5 in Accession #:    [id number] Weight:       224.1 lb Date of Birth:  04-09-1960  BSA:          2.134 m Patient Age:    60 years   BP:           129/84 mmHg Patient Gender: M          HR:           85 bpm. Exam Location:  Inpatient Procedure: 2D Echo, Color Doppler, Cardiac Doppler and Intracardiac            Opacification Agent Indications:    NSTEMI I21.4  History:        Patient has prior history of Echocardiogram examinations, most  recent 03/21/2015. CAD; Risk Factors:Hypertension, Diabetes,                 Dyslipidemia and Current Smoker.  Sonographer:    Darlina Sicilian RDCS Referring Phys: [id number] [place]  1. Left ventricular ejection fraction, by estimation, is 55 to 60%. The left ventricle has normal function. The left ventricle demonstrates regional wall motion abnormalities (see scoring diagram/findings for description). Left ventricular diastolic parameters are consistent with Grade I diastolic dysfunction (impaired relaxation).  2. Right ventricular systolic function is normal. The right ventricular size is normal.  3. The mitral valve is normal in structure. Mild mitral valve regurgitation. No evidence of mitral stenosis.  4. The aortic valve is normal in structure. Aortic valve regurgitation is not visualized. Aortic valve sclerosis is present, with no evidence of aortic valve stenosis.  5. The inferior vena cava is normal in size with greater than 50% respiratory variability, suggesting right atrial pressure of 3 mmHg. FINDINGS  Left Ventricle: Left ventricular ejection fraction, by estimation, is 55 to 60%. The left ventricle has normal function. The left ventricle demonstrates regional wall motion abnormalities. Definity contrast agent was given IV to delineate the left ventricular endocardial borders. The left ventricular internal cavity size was normal in size. There is no left ventricular hypertrophy. Left ventricular diastolic parameters are consistent with Grade I diastolic dysfunction (impaired relaxation).  LV Wall Scoring: The basal inferior segment is hypokinetic. The entire anterior wall, antero-lateral wall, mid and distal lateral wall, mid and distal anterior septum, inferior septum, entire apex, and mid and distal inferior wall are normal. Right Ventricle: The right ventricular size is normal. No increase in right ventricular wall thickness. Right ventricular systolic function is normal.  Left Atrium: Left atrial size was normal in size. Right Atrium: Right atrial size was normal in size. Pericardium: There is no evidence of pericardial effusion. Mitral Valve: The mitral valve is normal in structure. Mild mitral annular calcification. Mild mitral valve regurgitation. No evidence of mitral valve stenosis. Tricuspid Valve: The tricuspid valve is normal in structure. Tricuspid valve regurgitation is not demonstrated. No evidence of tricuspid stenosis. Aortic Valve: The aortic valve is normal in structure. Aortic valve regurgitation is not visualized. Aortic valve sclerosis is present, with no evidence of aortic valve stenosis. Pulmonic Valve: The pulmonic valve was normal in structure. Pulmonic valve regurgitation is not visualized. No evidence of pulmonic stenosis. Aorta: The aortic root is normal in size and structure. Venous: The inferior vena cava was not well visualized. The inferior vena cava is normal in size with greater than 50% respiratory variability, suggesting right atrial pressure of 3 mmHg. IAS/Shunts: No atrial level shunt detected by color flow Doppler.  LEFT VENTRICLE PLAX 2D LVIDd:         4.20 cm      Diastology LVIDs:         3.60 cm      LV e' medial:    3.75 cm/s LV PW:         1.30 cm      LV E/e' medial:  16.6 LV IVS:        1.10 cm      LV e' lateral:   3.15 cm/s LVOT diam:     2.20 cm      LV E/e' lateral: 19.7 LV SV:         49 LV SV Index:   23 LVOT Area:     3.80 cm  LV Volumes (MOD)  LV vol d, MOD A2C: 85.2 ml LV vol d, MOD A4C: 132.0 ml LV vol s, MOD A2C: 34.4 ml LV vol s, MOD A4C: 64.4 ml LV SV MOD A2C:     50.8 ml LV SV MOD A4C:     132.0 ml LV SV MOD BP:      59.9 ml LEFT ATRIUM             Index LA diam:        3.80 cm 1.78 cm/m LA Vol (A2C):   38.0 ml 17.81 ml/m LA Vol (A4C):   21.4 ml 10.03 ml/m LA Biplane Vol: 30.3 ml 14.20 ml/m  AORTIC VALVE LVOT Vmax:   74.40 cm/s LVOT Vmean:  52.200 cm/s LVOT VTI:    0.130 m  AORTA Ao Root diam: 3.10 cm MITRAL VALVE MV Area  (PHT): 2.34 cm    SHUNTS MV Decel Time: 324 msec    Systemic VTI:  0.13 m MV E velocity: 62.10 cm/s  Systemic Diam: 2.20 cm MV A velocity: 89.50 cm/s MV E/A ratio:  0.69 Kardie Tobb DO Electronically signed by Berniece Salines DO Signature Date/Time: 12/24/2020/4:04:55 PM    Final     Cardiac Studies  Cath 12/25/20:     Prox RCA lesion is 90% stenosed.   RV Branch-1 lesion is 70% stenosed.   RV Branch-2 lesion is 80% stenosed.   Prox RCA to Mid RCA lesion is 90% stenosed.   Mid RCA lesion is 20% stenosed.   Mid RCA to Dist RCA lesion is 30% stenosed.   2nd RPL lesion is 50% stenosed.   There is severe CAD in a dominant right coronary artery with 90% proximal and mid stenoses, diffuse 70 and 80% stenoses in an RV marginal branch, and mid distal 20 and 30% stenoses.   Due to innominate vessel tortuosity as well as significant radial vasospasm, the left coronary system was unable to be cannulated despite attempts with multiple catheters and additional verapamil, intra-arterial nitroglycerin and IV nitroglycerin.   Will initiate IV heparin 8 hours post procedure and plan for completion of the diagnostic catheterization to the left coronary system tomorrow via the femoral approach and plan for probable PCI to the RCA depending upon left coronary findings.  TTE 12/24/20: IMPRESSIONS   1. Left ventricular ejection fraction, by estimation, is 55 to 60%. The  left ventricle has normal function. The left ventricle demonstrates  regional wall motion abnormalities (see scoring diagram/findings for  description). Left ventricular diastolic  parameters are consistent with Grade I diastolic dysfunction (impaired  relaxation).   2. Right ventricular systolic function is normal. The right ventricular  size is normal.   3. The mitral valve is normal in structure. Mild mitral valve  regurgitation. No evidence of mitral stenosis.   4. The aortic valve is normal in structure. Aortic valve regurgitation is   not visualized. Aortic valve sclerosis is present, with no evidence of  aortic valve stenosis.   5. The inferior vena cava is normal in size with greater than 50%  respiratory variability, suggesting right atrial pressure of 3 mmHg.   Echo 03/21/2015 LV EF: 55% -   60%   -------------------------------------------------------------------  Indications:      CAD of native vessels 414.01.   -------------------------------------------------------------------  History:   PMH:   Myocardial infarction.  Risk factors:  Current  tobacco use. Hypertension. Diabetes mellitus. Dyslipidemia.   -------------------------------------------------------------------  Study Conclusions   - Left ventricle: The cavity size was normal. There was  moderate    concentric hypertrophy. Systolic function was normal. The    estimated ejection fraction was in the range of 55% to 60%.    Possible akinesis of the basalinferior myocardium. Doppler    parameters are consistent with abnormal left ventricular    relaxation (grade 1 diastolic dysfunction).  - Aortic valve: Trileaflet; mildly thickened, mildly calcified    leaflets.   Impressions:   - Compared to the prior study, there has been no significant    interval change.   Coronary Stent Intervention 12/24/20    Conclusion   Mid RCA lesion, 60% stenosed. Ost Ramus to Ramus lesion, 95% stenosed. Post intervention, there is a 0% residual stenosis. Dist Cx lesion, 95% stenosed. Post intervention, there is a 0% residual stenosis.   IMPRESSION:successful staged LAD, first obtuse marginal branch and left PLA branch PCI and drug-eluting stenting using synergy drug-eluting stents with a moderate mid dominant RCA stenosis found not to be physiologically significant. He has preserved LV function. This diagonal branch which was occluded at the time of LAD stenting 2 days ago had mild restoration of flow. He sheath was removed and a TR band was placed on the right  wrist to achieve patent hemostasis. The patient left the lab in stable condition.He'll be hydrated for the next 12 hours, discharged home in the morning on dual antiplatelet therapy and I will see him back in the office in 2-3 weeks.   Diagnostic Dominance: Right Intervention        Patient Profile     60 y.o. 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 Now presenting with classic anginal symptoms in the setting of stopping all medications. Cath showed 90% prox-mid RCA, 70-80% RV marginal branches and mild distal RCA disease. Unable to engage left coronary on cath yesterday due to radial artery vasospasm and innominate artery tortuosity. Now planned for repeat cath today to complete diagnostic evaluation and possible PCI of RCA. -Plan for repeat cath today to complete diagnostic evaluation with possible RCA intervention pending on what left sided system reveals -Continue ASA 81mg  daily -Continue lipitor 80mg  daily -Increase coreg to 12.5mg  BID -Continue nitro gtt for now; transition to ACE/ARB post-cath  #HTN: -On nitro gtt for now -Increase coreg to 12.5mg  BID -Add ACE/ARB post-cath  #HLD: TC 260, LDL 165, TG 235 not on statin -Continue lipitor 80mg  daily -Goal LDL<70 -Will need repeat lipids as out-patient  #DMII: -Management per primary     For questions or updates, please contact [facility] Please consult [email or web address] for contact info under        Signed, Freada Bergeron, MD  12/26/2020, 9:41 AM

## 2020-12-25 NOTE — Progress Notes (Signed)
Pt c/o constipation. Pt requests Miralax. MD paged.

## 2020-12-26 ENCOUNTER — Encounter (HOSPITAL_COMMUNITY): Payer: Self-pay | Admitting: Cardiovascular Disease

## 2020-12-26 ENCOUNTER — Inpatient Hospital Stay (HOSPITAL_COMMUNITY)
Admission: EM | Disposition: A | Discharge status: Home / Self Care | Payer: Self-pay | Source: Home / Self Care | Attending: Internal Medicine

## 2020-12-26 DIAGNOSIS — E1169 Type 2 diabetes mellitus with other specified complication: Secondary | ICD-10-CM | POA: Diagnosis not present

## 2020-12-26 DIAGNOSIS — R079 Chest pain, unspecified: Secondary | ICD-10-CM | POA: Diagnosis not present

## 2020-12-26 DIAGNOSIS — I2510 Atherosclerotic heart disease of native coronary artery without angina pectoris: Secondary | ICD-10-CM | POA: Diagnosis not present

## 2020-12-26 DIAGNOSIS — E669 Obesity, unspecified: Secondary | ICD-10-CM

## 2020-12-26 DIAGNOSIS — I214 Non-ST elevation (NSTEMI) myocardial infarction: Secondary | ICD-10-CM | POA: Diagnosis not present

## 2020-12-26 HISTORY — PX: LEFT HEART CATH AND CORONARY ANGIOGRAPHY: CATH118249

## 2020-12-26 LAB — GLUCOSE, CAPILLARY
Glucose-Capillary: 202 mg/dL — ABNORMAL HIGH (ref 70–99)
Glucose-Capillary: 156 mg/dL — ABNORMAL HIGH (ref 70–99)
Glucose-Capillary: 174 mg/dL — ABNORMAL HIGH (ref 70–99)
Glucose-Capillary: 177 mg/dL — ABNORMAL HIGH (ref 70–99)
Glucose-Capillary: 168 mg/dL — ABNORMAL HIGH (ref 70–99)

## 2020-12-26 LAB — CBC
HCT: 37.4 % — ABNORMAL LOW (ref 39.0–52.0)
Hemoglobin: 12.8 g/dL — ABNORMAL LOW (ref 13.0–17.0)
MCH: 31.7 pg (ref 26.0–34.0)
MCHC: 34.2 g/dL (ref 30.0–36.0)
MCV: 92.6 fL (ref 80.0–100.0)
Platelets: 210 10*3/uL (ref 150–400)
RBC: 4.04 MIL/uL — ABNORMAL LOW (ref 4.22–5.81)
RDW: 12 % (ref 11.5–15.5)
WBC: 9 10*3/uL (ref 4.0–10.5)
nRBC: 0 % (ref 0.0–0.2)

## 2020-12-26 LAB — BASIC METABOLIC PANEL
Anion gap: 6 (ref 5–15)
BUN: 14 mg/dL (ref 6–20)
CO2: 26 mmol/L (ref 22–32)
Calcium: 8.2 mg/dL — ABNORMAL LOW (ref 8.9–10.3)
Chloride: 101 mmol/L (ref 98–111)
Creatinine, Ser: 1.63 mg/dL — ABNORMAL HIGH (ref 0.61–1.24)
GFR, Estimated: 48 mL/min — ABNORMAL LOW (ref >60)
Glucose, Bld: 248 mg/dL — ABNORMAL HIGH (ref 70–99)
Potassium: 3.8 mmol/L (ref 3.5–5.1)
Sodium: 133 mmol/L — ABNORMAL LOW (ref 135–145)

## 2020-12-26 SURGERY — LEFT HEART CATH AND CORONARY ANGIOGRAPHY
Anesthesia: LOCAL

## 2020-12-26 MED ORDER — HEPARIN (PORCINE) 25000 UT/250ML-% IV SOLN
1350.0000 [IU]/h | INTRAVENOUS | Status: DC
  Administered 2020-12-27: 1450 [IU]/h via INTRAVENOUS
  Filled 2020-12-26 (×3): qty 250

## 2020-12-26 MED ORDER — CARVEDILOL 12.5 MG PO TABS
12.5000 mg | ORAL_TABLET | Freq: Two times a day (BID) | ORAL | Status: DC
  Filled 2020-12-26 (×2): qty 1

## 2020-12-26 MED ORDER — MIDAZOLAM HCL 2 MG/2ML IJ SOLN
INTRAMUSCULAR | Status: DC | PRN
  Administered 2020-12-26: 2 mg via INTRAVENOUS

## 2020-12-26 MED ORDER — LABETALOL HCL 5 MG/ML IV SOLN: 10.0000 mg | INTRAVENOUS | Status: AC | PRN

## 2020-12-26 MED ORDER — SODIUM CHLORIDE 0.9 % WEIGHT BASED INFUSION
3.0000 mL/kg/h | INTRAVENOUS | Status: AC
  Administered 2020-12-26: 3 mL/kg/h via INTRAVENOUS

## 2020-12-26 MED ORDER — HYDRALAZINE HCL 20 MG/ML IJ SOLN
10.0000 mg | INTRAMUSCULAR | Status: AC | PRN
  Administered 2020-12-26: 10 mg via INTRAVENOUS
  Filled 2020-12-26: qty 1

## 2020-12-26 MED ORDER — ACETAMINOPHEN 325 MG PO TABS
650.0000 mg | ORAL_TABLET | ORAL | Status: DC | PRN
  Filled 2020-12-26: qty 2

## 2020-12-26 MED ORDER — NITROGLYCERIN 1 MG/10 ML FOR IR/CATH LAB
INTRA_ARTERIAL | Status: AC
  Filled 2020-12-26: qty 10

## 2020-12-26 MED ORDER — HEPARIN (PORCINE) IN NACL 1000-0.9 UT/500ML-% IV SOLN
INTRAVENOUS | Status: DC | PRN
  Administered 2020-12-26: 500 mL

## 2020-12-26 MED ORDER — FENTANYL CITRATE (PF) 100 MCG/2ML IJ SOLN
INTRAMUSCULAR | Status: DC | PRN
  Administered 2020-12-26: 50 ug via INTRAVENOUS

## 2020-12-26 MED ORDER — SODIUM CHLORIDE 0.9% FLUSH
3.0000 mL | INTRAVENOUS | Status: DC | PRN
  Administered 2020-12-28: 3 mL via INTRAVENOUS

## 2020-12-26 MED ORDER — ASPIRIN 81 MG PO CHEW
81.0000 mg | CHEWABLE_TABLET | Freq: Every day | ORAL | Status: DC
  Administered 2020-12-27 – 2020-12-28 (×2): 81 mg via ORAL
  Filled 2020-12-26 (×2): qty 1

## 2020-12-26 MED ORDER — HEPARIN (PORCINE) IN NACL 1000-0.9 UT/500ML-% IV SOLN
INTRAVENOUS | Status: AC
  Filled 2020-12-26: qty 500

## 2020-12-26 MED ORDER — SODIUM CHLORIDE 0.9 % WEIGHT BASED INFUSION
1.0000 mL/kg/h | INTRAVENOUS | Status: DC
  Administered 2020-12-26 (×2): 1 mL/kg/h via INTRAVENOUS

## 2020-12-26 MED ORDER — ASPIRIN 81 MG PO CHEW
81.0000 mg | CHEWABLE_TABLET | ORAL | Status: AC
  Administered 2020-12-26: 81 mg via ORAL
  Filled 2020-12-26: qty 1

## 2020-12-26 MED ORDER — FENTANYL CITRATE (PF) 100 MCG/2ML IJ SOLN
INTRAMUSCULAR | Status: AC
  Filled 2020-12-26: qty 2

## 2020-12-26 MED ORDER — SODIUM CHLORIDE 0.9 % IV SOLN: 250.0000 mL | INTRAVENOUS | Status: DC | PRN

## 2020-12-26 MED ORDER — SODIUM CHLORIDE 0.9 % IV SOLN: INTRAVENOUS | Status: AC

## 2020-12-26 MED ORDER — ATORVASTATIN CALCIUM 80 MG PO TABS: 80.0000 mg | ORAL_TABLET | Freq: Every day | ORAL | Status: DC

## 2020-12-26 MED ORDER — LIDOCAINE HCL (PF) 1 % IJ SOLN
INTRAMUSCULAR | Status: DC | PRN
  Administered 2020-12-26: 18 mL

## 2020-12-26 MED ORDER — HEPARIN SODIUM (PORCINE) 1000 UNIT/ML IJ SOLN
INTRAMUSCULAR | Status: AC
  Filled 2020-12-26: qty 1

## 2020-12-26 MED ORDER — LIDOCAINE HCL (PF) 1 % IJ SOLN
INTRAMUSCULAR | Status: AC
  Filled 2020-12-26: qty 30

## 2020-12-26 MED ORDER — DIAZEPAM 5 MG PO TABS: 5.0000 mg | ORAL_TABLET | Freq: Four times a day (QID) | ORAL | Status: DC | PRN

## 2020-12-26 MED ORDER — SODIUM CHLORIDE 0.9% FLUSH
3.0000 mL | Freq: Two times a day (BID) | INTRAVENOUS | Status: DC
  Administered 2020-12-27 – 2021-01-01 (×6): 3 mL via INTRAVENOUS

## 2020-12-26 MED ORDER — ONDANSETRON HCL 4 MG/2ML IJ SOLN: 4.0000 mg | Freq: Four times a day (QID) | INTRAMUSCULAR | Status: DC | PRN

## 2020-12-26 MED ORDER — MIDAZOLAM HCL 2 MG/2ML IJ SOLN
INTRAMUSCULAR | Status: AC
  Filled 2020-12-26: qty 2

## 2020-12-26 SURGICAL SUPPLY — 8 items
CATH INFINITI 5FR MULTPACK ANG (CATHETERS) ×2 IMPLANT
CLOSURE MYNX CONTROL 5F (Vascular Products) ×2 IMPLANT
ELECT DEFIB PAD ADLT CADENCE (PAD) ×2 IMPLANT
KIT HEART LEFT (KITS) ×2 IMPLANT
PACK CARDIAC CATHETERIZATION (CUSTOM PROCEDURE TRAY) ×2 IMPLANT
SHEATH PINNACLE 5F 10CM (SHEATH) ×2 IMPLANT
TRANSDUCER W/STOPCOCK (MISCELLANEOUS) ×2 IMPLANT
WIRE EMERALD 3MM-J .035X150CM (WIRE) ×2 IMPLANT

## 2020-12-26 NOTE — Progress Notes (Signed)
Patient having N/V, also BP elevated PRN hydralazine given. MD aware.

## 2020-12-26 NOTE — Progress Notes (Addendum)
Inpatient Diabetes Program Recommendations  AACE/ADA: New Consensus Statement on Inpatient Glycemic Control (2015)  Target Ranges:  Prepandial:   less than 140 mg/dL      Peak postprandial:   less than 180 mg/dL (1-2 hours)      Critically ill patients:  140 - 180 mg/dL   Lab Results  Component Value Date   GLUCAP 202 (H) 12/26/2020   HGBA1C 9.9 (H) 12/25/2020    Review of Glycemic Control Results for [name], [name] (MRN [id number]) as of 12/26/2020 08:27  Ref. Range 12/25/2020 17:37 12/25/2020 21:06 12/26/2020 06:33  Glucose-Capillary Latest Ref Range: 70 - 99 mg/dL 188 (H) 358 (H) 202 (H)   Diabetes history: Type 2 DM Outpatient Diabetes medications: none Current orders for Inpatient glycemic control: Semglee 15 units QHS, Novolog 0-15 units TID & HS   Inpatient Diabetes Program Recommendations:    Consider increasing Semglee to 24 units QHS.  Patient will need a meter at discharge. Blood glucose meter #[id number].   Thanks, Bronson Curb, MSN, RNC-OB Diabetes Coordinator [phone] (8a-5p)

## 2020-12-26 NOTE — Progress Notes (Signed)
ANTICOAGULATION CONSULT NOTE - Follow Up Consult  Pharmacy Consult for heparin Indication:  NSTEMI  Labs: Recent Labs    12/24/20 0651 12/24/20 0851 12/24/20 1152 12/24/20 1352 12/24/20 1558 12/25/20 0031 12/25/20 1046 12/26/20 0232  HGB 16.2  --   --   --   --  14.5  --  12.8*  HCT 46.4  --   --   --   --  42.4  --  37.4*  PLT 242  --   --   --   --  250  --  210  HEPARINUNFRC  --   --   --   --  0.18* 0.25* 0.38  --   CREATININE 1.56*  --   --   --   --  1.83* 1.77* 1.63*  TROPONINIHS 158* 171* 215* 233*  --   --   --   --      Assessment: 60yo male s/p repeat cath today.  Now to restart heparin 10 hours post sheath removal.  Goal of Therapy:  Heparin level 0.3-0.7 units/ml   Plan:  Start heparin at midnight at 1450 units / hr Heparin level 8 hours after heparin starts Daily heparin level, CBC  Thank you Anette Guarneri, PharmD  12/26/2020,3:40 PM  Please check AMION for all [facility] phone numbers After 10:00 PM, call [place]

## 2020-12-26 NOTE — Progress Notes (Addendum)
Progress Note  Patient Name: [name] Date of Encounter: 12/27/2020  [facility] Cardiologist: Freada Bergeron, MD   Subjective   Patient is very worried about having CABG as he is concerned about recovery and not having resources at home to help him with the post-op course. Denies current chest pain, but states he is having difficulty with his speech this morning.   Cath with significant multivessel CAD; recommended for CABG.  Blood pressure elevated this AM Cr stable at 1.52  Inpatient Medications    Scheduled Meds:  aspirin  81 mg Oral Daily   atorvastatin  80 mg Oral Daily   carvedilol  12.5 mg Oral BID WC   folic acid  1 mg Oral Daily   insulin aspart  0-15 Units Subcutaneous TID WC   insulin aspart  0-5 Units Subcutaneous QHS   insulin glargine-yfgn  15 Units Subcutaneous QHS   multivitamin with minerals  1 tablet Oral Daily   sodium chloride flush  3 mL Intravenous Q12H   sodium chloride flush  3 mL Intravenous Q12H   sodium chloride flush  3 mL Intravenous Q12H   thiamine  100 mg Oral Daily   Or   thiamine  100 mg Intravenous Daily   Continuous Infusions:  sodium chloride     sodium chloride     heparin 1,450 Units/hr (12/27/20 0040)   nitroGLYCERIN 60 mcg/min (12/27/20 0600)   PRN Meds: sodium chloride, sodium chloride, acetaminophen, diazepam, LORazepam **OR** LORazepam, ondansetron (ZOFRAN) IV, sodium chloride flush, sodium chloride flush   Vital Signs    Vitals:   12/26/20 2240 12/27/20 0040 12/27/20 0550 12/27/20 0620  BP:  (!) 150/93 (!) 180/97 (!) 162/103  Pulse:  84 (!) 101   Resp: 20     Temp:  98 F (36.7 C) 98.4 F (36.9 C)   TempSrc:  Oral Oral   SpO2:   94%   Weight:   101.7 kg   Height:        Intake/Output Summary (Last 24 hours) at 12/27/2020 0825 Last data filed at 12/27/2020 0309 Gross per 24 hour  Intake 1480.25 ml  Output 1550 ml  Net -69.75 ml   Last 3 Weights 12/27/2020 12/26/2020 12/25/2020  Weight (lbs)  224 lb 3.3 oz 228 lb 9.6 oz 225 lb 11.2 oz  Weight (kg) 101.7 kg 103.692 kg 102.377 kg      Telemetry    NSR- Personally Reviewed  ECG     No new tracing- Personally Reviewed  Physical Exam   GEN: No acute distress. Comfortable  Neck: No JVD Cardiac: RRR, no murmurs, rubs, or gallops.  Respiratory: CTAB GI: Soft, nontender, non-distended  MS: No edema; No deformity. Right radial cath site c/d/I without hematoma Neuro:  Nonfocal  Psych: Normal affect   Labs    High Sensitivity Troponin:   Recent Labs  Lab 12/24/20 0651 12/24/20 0851 12/24/20 1152 12/24/20 1352  TROPONINIHS 158* 171* 215* 233*     Chemistry Recent Labs  Lab 12/24/20 0651 12/25/20 0031 12/25/20 1046 12/26/20 0232 12/27/20 0143  NA 132* 134* 136 133* 136  K 4.0 3.8 3.5 3.8 3.9  CL 96* 99 101 101 99  CO2 27 27 30 26 27   GLUCOSE 432* 279* 164* 248* 168*  BUN 17 20 16 14 11   CREATININE 1.56* 1.83* 1.77* 1.63* 1.52*  CALCIUM 9.3 9.3 8.8* 8.2* 8.6*  PROT 7.4 6.3*  --   --   --   ALBUMIN 3.9 3.2*  --   --   --  AST 32 22  --   --   --   ALT 42 32  --   --   --   ALKPHOS 131* 95  --   --   --   BILITOT 0.5 0.8  --   --   --   GFRNONAA 51* 42* 43* 48* 52*  ANIONGAP 9 8 5 6 10     Lipids  Recent Labs  Lab 12/25/20 0031  CHOL 260*  TRIG 235*  HDL 48  LDLCALC 165*  CHOLHDL 5.4    Hematology Recent Labs  Lab 12/25/20 0031 12/26/20 0232 12/27/20 0143  WBC 12.7* 9.0 10.1  RBC 4.61 4.04* 4.31  HGB 14.5 12.8* 13.3  HCT 42.4 37.4* 40.2  MCV 92.0 92.6 93.3  MCH 31.5 31.7 30.9  MCHC 34.2 34.2 33.1  RDW 12.0 12.0 12.1  PLT 250 210 224   Thyroid No results for input(s): TSH, FREET4 in the last 168 hours.  BNP Recent Labs  Lab 12/24/20 0651  BNP 412.0*    DDimer No results for input(s): DDIMER in the last 168 hours.   Radiology    CARDIAC CATHETERIZATION  Result Date: 12/26/2020   Prox Cx lesion is 60% stenosed.   Prox Cx to Mid Cx lesion is 90% stenosed.   1st Mrg-2 lesion is  80% stenosed.   Mid LAD lesion is 50% stenosed.   Mid LAD to Dist LAD lesion is 80% stenosed.   2nd Diag lesion is 80% stenosed.   RPAV lesion is 50% stenosed.   RV Branch-1 lesion is 70% stenosed.   RV Branch-2 lesion is 80% stenosed.   Prox RCA-1 lesion is 90% stenosed.   Prox RCA to Mid RCA lesion is 95% stenosed.   Mid RCA lesion is 20% stenosed.   Prox RCA-2 lesion is 65% stenosed.   Dist RCA lesion is 20% stenosed.   1st Diag lesion is 90% stenosed.   Prox LAD lesion is 30% stenosed.   Previously placed Mid Cx to Dist Cx stent (unknown type) is  widely patent.   Previously placed 1st Mrg-1 stent (unknown type) is  widely patent.   LV end diastolic pressure is severely elevated. 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  In this diabetic male with significant diffuse disease in the LAD, left circumflex, and right coronary artery recommend surgical evaluation for consideration of CABG revascularization. RECOMMENDATION: Surgical consultation for consideration of CABG.  We will reinitiate heparin later this evening.  Patient continues to be on intravenous nitroglycerin.  Increase medical therapy as blood pressure and heart rate allow.  Aggressive lipid-lowering therapy with target LDL in the 50s or below.   CARDIAC CATHETERIZATION  Result Date: 12/25/2020   Prox RCA lesion is 90% stenosed.   RV Branch-1 lesion is 70% stenosed.   RV Branch-2 lesion is 80% stenosed.   Prox RCA to Mid RCA lesion is 90% stenosed.   Mid RCA lesion is 20% stenosed.   Mid RCA to Dist RCA lesion is 30% stenosed.   2nd RPL lesion is 50% stenosed. There is severe CAD in a dominant right coronary artery with 90% proximal and mid stenoses, diffuse 70 and 80% stenoses in an RV marginal branch, and mid distal 20 and 30% stenoses. Due to innominate vessel tortuosity as well as significant radial vasospasm, the left coronary  system was unable to be cannulated despite attempts with multiple catheters  and additional verapamil, intra-arterial nitroglycerin and IV nitroglycerin. Will initiate IV heparin 8 hours post procedure and plan for completion of the diagnostic catheterization to the left coronary system tomorrow via the femoral approach and plan for probable PCI to the RCA depending upon left coronary findings.    Cardiac Studies  Cath 12/26/20:   Prox Cx lesion is 60% stenosed.   Prox Cx to Mid Cx lesion is 90% stenosed.   1st Mrg-2 lesion is 80% stenosed.   Mid LAD lesion is 50% stenosed.   Mid LAD to Dist LAD lesion is 80% stenosed.   2nd Diag lesion is 80% stenosed.   RPAV lesion is 50% stenosed.   RV Branch-1 lesion is 70% stenosed.   RV Branch-2 lesion is 80% stenosed.   Prox RCA-1 lesion is 90% stenosed.   Prox RCA to Mid RCA lesion is 95% stenosed.   Mid RCA lesion is 20% stenosed.   Prox RCA-2 lesion is 65% stenosed.   Dist RCA lesion is 20% stenosed.   1st Diag lesion is 90% stenosed.   Prox LAD lesion is 30% stenosed.   Previously placed Mid Cx to Dist Cx stent (unknown type) is  widely patent.   Previously placed 1st Mrg-1 stent (unknown type) is  widely patent.   LV end diastolic pressure is severely elevated.   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  In this diabetic male with significant diffuse disease in the LAD, left circumflex, and right coronary artery recommend surgical evaluation for consideration of CABG revascularization.   RECOMMENDATION: Surgical consultation for consideration of CABG.  We will reinitiate heparin later this evening.  Patient continues to be on intravenous nitroglycerin.  Increase medical therapy as blood pressure and heart rate allow.  Aggressive lipid-lowering therapy with target LDL in the 50s or below.  Cath 12/25/20:     Prox RCA lesion  is 90% stenosed.   RV Branch-1 lesion is 70% stenosed.   RV Branch-2 lesion is 80% stenosed.   Prox RCA to Mid RCA lesion is 90% stenosed.   Mid RCA lesion is 20% stenosed.   Mid RCA to Dist RCA lesion is 30% stenosed.   2nd RPL lesion is 50% stenosed.   There is severe CAD in a dominant right coronary artery with 90% proximal and mid stenoses, diffuse 70 and 80% stenoses in an RV marginal branch, and mid distal 20 and 30% stenoses.   Due to innominate vessel tortuosity as well as significant radial vasospasm, the left coronary system was unable to be cannulated despite attempts with multiple catheters and additional verapamil, intra-arterial nitroglycerin and IV nitroglycerin.   Will initiate IV heparin 8 hours post procedure and plan for completion of the diagnostic catheterization to the left coronary system tomorrow via the femoral approach and plan for probable PCI to the RCA depending upon left coronary findings.  TTE 12/24/20: IMPRESSIONS   1. Left ventricular ejection fraction, by estimation, is 55 to 60%. The  left ventricle has normal function. The left ventricle demonstrates  regional wall motion abnormalities (see scoring diagram/findings for  description). Left ventricular diastolic  parameters are consistent with Grade I diastolic dysfunction (impaired  relaxation).   2. Right ventricular systolic function is normal. The right ventricular  size is normal.   3. The mitral valve is normal in structure. Mild mitral valve  regurgitation. No evidence of mitral stenosis.   4.  The aortic valve is normal in structure. Aortic valve regurgitation is  not visualized. Aortic valve sclerosis is present, with no evidence of  aortic valve stenosis.   5. The inferior vena cava is normal in size with greater than 50%  respiratory variability, suggesting right atrial pressure of 3 mmHg.   Echo 03/21/2015 LV EF: 55% -   60%    -------------------------------------------------------------------  Indications:      CAD of native vessels 414.01.   -------------------------------------------------------------------  History:   PMH:   Myocardial infarction.  Risk factors:  Current  tobacco use. Hypertension. Diabetes mellitus. Dyslipidemia.   -------------------------------------------------------------------  Study Conclusions   - Left ventricle: The cavity size was normal. There was moderate    concentric hypertrophy. Systolic function was normal. The    estimated ejection fraction was in the range of 55% to 60%.    Possible akinesis of the basalinferior myocardium. Doppler    parameters are consistent with abnormal left ventricular    relaxation (grade 1 diastolic dysfunction).  - Aortic valve: Trileaflet; mildly thickened, mildly calcified    leaflets.   Impressions:   - Compared to the prior study, there has been no significant    interval change.   Coronary Stent Intervention 12/24/20    Conclusion   Mid RCA lesion, 60% stenosed. Ost Ramus to Ramus lesion, 95% stenosed. Post intervention, there is a 0% residual stenosis. Dist Cx lesion, 95% stenosed. Post intervention, there is a 0% residual stenosis.   IMPRESSION:successful staged LAD, first obtuse marginal branch and left PLA branch PCI and drug-eluting stenting using synergy drug-eluting stents with a moderate mid dominant RCA stenosis found not to be physiologically significant. He has preserved LV function. This diagonal branch which was occluded at the time of LAD stenting 2 days ago had mild restoration of flow. He sheath was removed and a TR band was placed on the right wrist to achieve patent hemostasis. The patient left the lab in stable condition.He'll be hydrated for the next 12 hours, discharged home in the morning on dual antiplatelet therapy and I will see him back in the office in 2-3 weeks.   Diagnostic Dominance:  Right Intervention        Patient Profile     60 y.o. 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 Now presenting with classic anginal symptoms in the setting of stopping all medications. Cath showed 90% prox-mid RCA, 70-80% RV marginal branches and mild distal RCA disease. Unable to engage left system on initial cath and therefore repeat cath performed 11/16 which showed severe multivessel CAD now recommended for CABG -CT surgery consult for consideration of CABG; patient amenable to speaking with them to discuss further -Continue ASA 81mg  daily -Continue lipitor 80mg  daily -Increase coreg to 25mg  BID -ACE/ARB post-OR  #Speech Difficulties: Patient complains of mild speech difficulties this AM with diffculty "getting words out and enunciating." Otherwise non-focal exam. Will obtain CT head to r/o stroke. -Check CT head  #HTN: Elevated today. -Continue nitro gtt for now and wean as able -Increase coreg to 25mg  BID -Start amlodipine 10mg   daily -Add ACE/ARB post-OR  #HLD: TC 260, LDL 165, TG 235 not on statin -Continue lipitor 80mg  daily -Goal LDL<70 -Will need repeat lipids as out-patient  #DMII: -Management per primary  Extensive time spent with the patient discussing his cath results and need for CABG. He is very hesitant about the procedure and recovery period but is amenable to speaking with surgery. Lesions much more suitably for surgical intervention over PCI especially due to underlying diabetes.      For questions or updates, please contact [place] Please consult [email or web address] for contact info  under        Signed, Freada Bergeron, MD  12/27/2020, 8:25 AM

## 2020-12-26 NOTE — TOC Progression Note (Addendum)
Transition of Care [facility]) - Progression Note    Patient Details  Name: [name] MRN: [id number] Date of Birth: [birth year]  [facility] CM/SW Contact  Zenon Mayo, RN Phone Number: 12/26/2020, 4:38 PM  Clinical Narrative:    Repeat cath to day, TCT to eval for CABG per MD notes. Conts on heparin drip, nitro drip.        Expected Discharge Plan and Services                                                 Social Determinants of Health (SDOH) Interventions    Readmission Risk Interventions No flowsheet data found.

## 2020-12-26 NOTE — Progress Notes (Signed)
TCTS consulted for CABG evaluation. °

## 2020-12-26 NOTE — Progress Notes (Signed)
Brief Nutrition Note  RD attempted to go educate patient on uncontrolled T2DM.  Pt was not in the room at the time of RD visit. RD to attempt to follow-up to attempt education again at a later date.  Left "Plate Method for Diabetes" handout from the [employer] in patient's room for review.  Derrel Nip, RD, LDN (she/her/hers) Clinical Inpatient Dietitian RD Pager/After-Hours/Weekend Pager # in [place]

## 2020-12-26 NOTE — Progress Notes (Signed)
Progress Note    Torrie Lafavor  PJS:[id number] DOB: Feb 04, 1961  DOA: 12/24/2020 PCP: Patient, No Pcp Per (Inactive)    Brief Narrative:     Medical records reviewed and are as summarized below:  Deric Bocock is an 60 y.o. male who presented with complaints of midsternal chest pain/achiness that was accompanied by dyspnea. He stated that it was similar to the feeling he had when he had an MI.  S/p cath and plan for stage cath again on 11/16 with intervention.    Assessment/Plan:   Active Problems:   Chest pain   AKI (acute kidney injury) ([place])   NSTEMI     - Cath with 90% prox-mid RCA, 70% RV marginal branch 1 and 80% RV marginal branch 2, with mild distal disease. Unable to engage left coronary due to radial artery spasm and innominate vessel tortuosity. Planned for repeat cath today.   HTN urgency     - coreg     - per cards   AKI? Vs CKD     - renal US unremarkable     - hold lisinopril; watch nephrotoxins   DM2     -A1C is 9/9-- improved on 5 years ago   HLD/CAD     - ASA, statin, BB   Tobacco abuse/EtOH abuse/Marijuana abuse     - counseled against further use     - no sign of withdrawal  obesity Body mass index is 35.28 kg/m.   Family Communication/Anticipated D/C date and plan/Code Status    Code Status: Full Code.  Disposition Plan: Status is: Inpatient  Remains inpatient appropriate because: cath 11/16         Medical Consultants:   cards     Subjective:   Wants to go home  Objective:    Vitals:   12/25/20 1953 12/25/20 2222 12/26/20 0132 12/26/20 0635  BP: 138/74 135/77  (!) 140/99  Pulse:    94  Resp: 20 20  18   Temp:  98.6 F (37 C)  98.5 F (36.9 C)  TempSrc:  Oral  Oral  SpO2: 93% 96%  93%  Weight:   103.7 kg   Height:        Intake/Output Summary (Last 24 hours) at 12/26/2020 1205 Last data filed at 12/26/2020 0810 Gross per 24 hour  Intake 1064.15 ml  Output 250 ml  Net 814.15 ml   Filed Weights    12/24/20 0838 12/25/20 0028 12/26/20 0132  Weight: 101.6 kg 102.4 kg 103.7 kg    Exam:  General: Appearance:    Obese male in no acute distress     Lungs:     Clear to auscultation bilaterally, respirations unlabored  Heart:    Normal heart rate. Normal rhythm. No murmurs, rubs, or gallops.    MS:   All extremities are intact.    Neurologic:   Awake, alert, oriented x 3. No apparent focal neurological           defect.      Data Reviewed:   I have personally reviewed following labs and imaging studies:  Labs: Labs show the following:   Basic Metabolic Panel: Recent Labs  Lab 12/24/20 0651 12/25/20 0031 12/25/20 1046 12/26/20 0232  NA 132* 134* 136 133*  K 4.0 3.8 3.5 3.8  CL 96* 99 101 101  CO2 27 27 30 26   GLUCOSE 432* 279* 164* 248*  BUN 17 20 16 14   CREATININE 1.56* 1.83* 1.77* 1.63*  CALCIUM 9.3 9.3  8.8* 8.2*   GFR Estimated Creatinine Clearance: 55.8 mL/min (A) (by C-G formula based on SCr of 1.63 mg/dL (H)). Liver Function Tests: Recent Labs  Lab 12/24/20 0651 12/25/20 0031  AST 32 22  ALT 42 32  ALKPHOS 131* 95  BILITOT 0.5 0.8  PROT 7.4 6.3*  ALBUMIN 3.9 3.2*   No results for input(s): LIPASE, AMYLASE in the last 168 hours. No results for input(s): AMMONIA in the last 168 hours. Coagulation profile No results for input(s): INR, PROTIME in the last 168 hours.  CBC: Recent Labs  Lab 12/24/20 0651 12/25/20 0031 12/26/20 0232  WBC 10.8* 12.7* 9.0  NEUTROABS 5.7  --   --   HGB 16.2 14.5 12.8*  HCT 46.4 42.4 37.4*  MCV 90.8 92.0 92.6  PLT 242 250 210   Cardiac Enzymes: No results for input(s): CKTOTAL, CKMB, CKMBINDEX, TROPONINI in the last 168 hours. BNP (last 3 results) No results for input(s): PROBNP in the last 8760 hours. CBG: Recent Labs  Lab 12/25/20 1737 12/25/20 2106 12/26/20 0633 12/26/20 0930 12/26/20 1127  GLUCAP 188* 358* 202* 156* 174*   D-Dimer: No results for input(s): DDIMER in the last 72 hours. Hgb  A1c: Recent Labs    12/24/20 1154 12/25/20 2105  HGBA1C 10.5* 9.9*   Lipid Profile: Recent Labs    12/25/20 0031  CHOL 260*  HDL 48  LDLCALC 165*  TRIG 235*  CHOLHDL 5.4   Thyroid function studies: No results for input(s): TSH, T4TOTAL, T3FREE, THYROIDAB in the last 72 hours.  Invalid input(s): FREET3 Anemia work up: No results for input(s): VITAMINB12, FOLATE, FERRITIN, TIBC, IRON, RETICCTPCT in the last 72 hours. Sepsis Labs: Recent Labs  Lab 12/24/20 0651 12/25/20 0031 12/26/20 0232  WBC 10.8* 12.7* 9.0    Microbiology Recent Results (from the past 240 hour(s))  Resp Panel by RT-PCR (Flu A&B, Covid) Nasopharyngeal Swab     Status: None   Collection Time: 12/24/20 12:57 PM   Specimen: Nasopharyngeal Swab; Nasopharyngeal(NP) swabs in vial transport medium  Result Value Ref Range Status   SARS Coronavirus 2 by RT PCR NEGATIVE NEGATIVE Final    Comment: (NOTE) SARS-CoV-2 target nucleic acids are NOT DETECTED.  The SARS-CoV-2 RNA is generally detectable in upper respiratory specimens during the acute phase of infection. The lowest concentration of SARS-CoV-2 viral copies this assay can detect is 138 copies/mL. A negative result does not preclude SARS-Cov-2 infection and should not be used as the sole basis for treatment or other patient management decisions. A negative result may occur with  improper specimen collection/handling, submission of specimen other than nasopharyngeal swab, presence of viral mutation(s) within the areas targeted by this assay, and inadequate number of viral copies(<138 copies/mL). A negative result must be combined with clinical observations, patient history, and epidemiological information. The expected result is Negative.  Fact Sheet for Patients:  EntrepreneurPulse.com.au  Fact Sheet for Healthcare Providers:  IncredibleEmployment.be  This test is no t yet approved or cleared by the [place] FDA and  has been authorized for detection and/or diagnosis of SARS-CoV-2 by FDA under an Emergency Use Authorization (EUA). This EUA will remain  in effect (meaning this test can be used) for the duration of the COVID-19 declaration under Section 564(b)(1) of the Act, 21 U.S.C.section 360bbb-3(b)(1), unless the authorization is terminated  or revoked sooner.       Influenza A by PCR NEGATIVE NEGATIVE Final   Influenza B by PCR NEGATIVE NEGATIVE Final    Comment: (  NOTE) The Xpert Xpress SARS-CoV-2/FLU/RSV plus assay is intended as an aid in the diagnosis of influenza from Nasopharyngeal swab specimens and should not be used as a sole basis for treatment. Nasal washings and aspirates are unacceptable for Xpert Xpress SARS-CoV-2/FLU/RSV testing.  Fact Sheet for Patients: EntrepreneurPulse.com.au  Fact Sheet for Healthcare Providers: IncredibleEmployment.be  This test is not yet approved or cleared by the [place] FDA and has been authorized for detection and/or diagnosis of SARS-CoV-2 by FDA under an Emergency Use Authorization (EUA). This EUA will remain in effect (meaning this test can be used) for the duration of the COVID-19 declaration under Section 564(b)(1) of the Act, 21 U.S.C. section 360bbb-3(b)(1), unless the authorization is terminated or revoked.  Performed at [facility], [address] [place], [place] [address]     Procedures and diagnostic studies:  CARDIAC CATHETERIZATION  Result Date: 12/25/2020   Prox RCA lesion is 90% stenosed.   RV Branch-1 lesion is 70% stenosed.   RV Branch-2 lesion is 80% stenosed.   Prox RCA to Mid RCA lesion is 90% stenosed.   Mid RCA lesion is 20% stenosed.   Mid RCA to Dist RCA lesion is 30% stenosed.   2nd RPL lesion is 50% stenosed. There is severe CAD in a dominant right coronary artery with 90% proximal and mid stenoses, diffuse 70 and 80% stenoses in an RV  marginal branch, and mid distal 20 and 30% stenoses. Due to innominate vessel tortuosity as well as significant radial vasospasm, the left coronary system was unable to be cannulated despite attempts with multiple catheters and additional verapamil, intra-arterial nitroglycerin and IV nitroglycerin. Will initiate IV heparin 8 hours post procedure and plan for completion of the diagnostic catheterization to the left coronary system tomorrow via the femoral approach and plan for probable PCI to the RCA depending upon left coronary findings.   US RENAL  Result Date: 12/24/2020 CLINICAL DATA:  AKI EXAM: RENAL / URINARY TRACT ULTRASOUND COMPLETE COMPARISON:  None. FINDINGS: Right Kidney: Renal measurements: 9.9 x 4.3 x 4.9cm = volume: 110 mL. Echogenicity within normal limits. No mass or hydronephrosis visualized. Left Kidney: Renal measurements: 11.2 x 5.3 x 4.3cm = volume: 133 mL. Echogenicity within normal limits. No mass or hydronephrosis visualized. Urinary bladder: Appears normal for degree of bladder distention. Other: Likely hepatic steatosis. IMPRESSION: 1. Likely hepatic steatosis. 2. Unremarkable renal ultrasound. Electronically Signed   By: Iven Finn M.D.   On: 12/24/2020 17:31   ECHOCARDIOGRAM COMPLETE  Result Date: 12/24/2020    ECHOCARDIOGRAM REPORT   Patient Name:   NOWELL SITES Date of Exam: 12/24/2020 Medical Rec #:  [id number]  Height:       67.5 in Accession #:    [id number] Weight:       224.1 lb Date of Birth:  [birth year]  BSA:          2.134 m Patient Age:    79 years   BP:           129/84 mmHg Patient Gender: M          HR:           85 bpm. Exam Location:  Inpatient Procedure: 2D Echo, Color Doppler, Cardiac Doppler and Intracardiac            Opacification Agent Indications:    NSTEMI I21.4  History:        Patient has prior history of Echocardiogram examinations, most  recent 03/21/2015. CAD; Risk Factors:Hypertension, Diabetes,                 Dyslipidemia and  Current Smoker.  Sonographer:    Darlina Sicilian RDCS Referring Phys: [id number] [place]  1. Left ventricular ejection fraction, by estimation, is 55 to 60%. The left ventricle has normal function. The left ventricle demonstrates regional wall motion abnormalities (see scoring diagram/findings for description). Left ventricular diastolic parameters are consistent with Grade I diastolic dysfunction (impaired relaxation).  2. Right ventricular systolic function is normal. The right ventricular size is normal.  3. The mitral valve is normal in structure. Mild mitral valve regurgitation. No evidence of mitral stenosis.  4. The aortic valve is normal in structure. Aortic valve regurgitation is not visualized. Aortic valve sclerosis is present, with no evidence of aortic valve stenosis.  5. The inferior vena cava is normal in size with greater than 50% respiratory variability, suggesting right atrial pressure of 3 mmHg. FINDINGS  Left Ventricle: Left ventricular ejection fraction, by estimation, is 55 to 60%. The left ventricle has normal function. The left ventricle demonstrates regional wall motion abnormalities. Definity contrast agent was given IV to delineate the left ventricular endocardial borders. The left ventricular internal cavity size was normal in size. There is no left ventricular hypertrophy. Left ventricular diastolic parameters are consistent with Grade I diastolic dysfunction (impaired relaxation).  LV Wall Scoring: The basal inferior segment is hypokinetic. The entire anterior wall, antero-lateral wall, mid and distal lateral wall, mid and distal anterior septum, inferior septum, entire apex, and mid and distal inferior wall are normal. Right Ventricle: The right ventricular size is normal. No increase in right ventricular wall thickness. Right ventricular systolic function is normal. Left Atrium: Left atrial size was normal in size. Right Atrium: Right atrial size was normal in  size. Pericardium: There is no evidence of pericardial effusion. Mitral Valve: The mitral valve is normal in structure. Mild mitral annular calcification. Mild mitral valve regurgitation. No evidence of mitral valve stenosis. Tricuspid Valve: The tricuspid valve is normal in structure. Tricuspid valve regurgitation is not demonstrated. No evidence of tricuspid stenosis. Aortic Valve: The aortic valve is normal in structure. Aortic valve regurgitation is not visualized. Aortic valve sclerosis is present, with no evidence of aortic valve stenosis. Pulmonic Valve: The pulmonic valve was normal in structure. Pulmonic valve regurgitation is not visualized. No evidence of pulmonic stenosis. Aorta: The aortic root is normal in size and structure. Venous: The inferior vena cava was not well visualized. The inferior vena cava is normal in size with greater than 50% respiratory variability, suggesting right atrial pressure of 3 mmHg. IAS/Shunts: No atrial level shunt detected by color flow Doppler.  LEFT VENTRICLE PLAX 2D LVIDd:         4.20 cm      Diastology LVIDs:         3.60 cm      LV e' medial:    3.75 cm/s LV PW:         1.30 cm      LV E/e' medial:  16.6 LV IVS:        1.10 cm      LV e' lateral:   3.15 cm/s LVOT diam:     2.20 cm      LV E/e' lateral: 19.7 LV SV:         49 LV SV Index:   23 LVOT Area:     3.80 cm  LV Volumes (  MOD) LV vol d, MOD A2C: 85.2 ml LV vol d, MOD A4C: 132.0 ml LV vol s, MOD A2C: 34.4 ml LV vol s, MOD A4C: 64.4 ml LV SV MOD A2C:     50.8 ml LV SV MOD A4C:     132.0 ml LV SV MOD BP:      59.9 ml LEFT ATRIUM             Index LA diam:        3.80 cm 1.78 cm/m LA Vol (A2C):   38.0 ml 17.81 ml/m LA Vol (A4C):   21.4 ml 10.03 ml/m LA Biplane Vol: 30.3 ml 14.20 ml/m  AORTIC VALVE LVOT Vmax:   74.40 cm/s LVOT Vmean:  52.200 cm/s LVOT VTI:    0.130 m  AORTA Ao Root diam: 3.10 cm MITRAL VALVE MV Area (PHT): 2.34 cm    SHUNTS MV Decel Time: 324 msec    Systemic VTI:  0.13 m MV E velocity: 62.10  cm/s  Systemic Diam: 2.20 cm MV A velocity: 89.50 cm/s MV E/A ratio:  0.69 Kardie Tobb DO Electronically signed by Berniece Salines DO Signature Date/Time: 12/24/2020/4:04:55 PM    Final     Medications:    aspirin EC  81 mg Oral Daily   atorvastatin  80 mg Oral Daily   carvedilol  12.5 mg Oral BID WC   folic acid  1 mg Oral Daily   heparin  5,000 Units Subcutaneous Q8H   insulin aspart  0-15 Units Subcutaneous TID WC   insulin aspart  0-5 Units Subcutaneous QHS   insulin glargine-yfgn  15 Units Subcutaneous QHS   multivitamin with minerals  1 tablet Oral Daily   sodium chloride flush  3 mL Intravenous Q12H   sodium chloride flush  3 mL Intravenous Q12H   thiamine  100 mg Oral Daily   Or   thiamine  100 mg Intravenous Daily   Continuous Infusions:  sodium chloride     sodium chloride 1 mL/kg/hr (12/26/20 0729)   nitroGLYCERIN 46.667 mcg/min (12/26/20 0131)     LOS: 2 days   Geradine Girt  [facility]   How to contact the [facility] Attending or Consulting provider [place] or covering provider during after hours [place], for this patient?  Check the care team in [facility] and look for a) attending/consulting [facility] provider listed and b) the [facility] team listed Log into [email or web address] and use [place]'s universal password to access. If you do not have the password, please contact the hospital operator. Locate the [facility] provider you are looking for under [facility] and page to a number that you can be directly reached. If you still have difficulty reaching the provider, please page the [facility] (Director on Call) for the Hospitalists listed on amion for assistance.  12/26/2020, 12:05 PM

## 2020-12-26 NOTE — Interval H&P Note (Signed)
Cath Lab Visit (complete for each Cath Lab visit)  Clinical Evaluation Leading to the Procedure:   ACS: Yes.    Non-ACS:    Anginal Classification: CCS III  Anti-ischemic medical therapy: Maximal Therapy (2 or more classes of medications)  Non-Invasive Test Results: No non-invasive testing performed  Prior CABG: No previous CABG      History and Physical Interval Note:  12/26/2020 1:58 PM  [name]  has presented today for surgery, with the diagnosis of cad.  The various methods of treatment have been discussed with the patient and family. After consideration of risks, benefits and other options for treatment, the patient has consented to  Procedure(s): LEFT HEART CATH AND CORONARY ANGIOGRAPHY (N/A) as a surgical intervention.  The patient's history has been reviewed, patient examined, no change in status, stable for surgery.  I have reviewed the patient's chart and labs.  Questions were answered to the patient's satisfaction.     [name]

## 2020-12-27 ENCOUNTER — Inpatient Hospital Stay (HOSPITAL_COMMUNITY): Payer: BC Managed Care – PPO

## 2020-12-27 ENCOUNTER — Encounter (HOSPITAL_COMMUNITY): Payer: Self-pay | Admitting: Cardiovascular Disease

## 2020-12-27 ENCOUNTER — Other Ambulatory Visit (HOSPITAL_COMMUNITY): Payer: No Typology Code available for payment source

## 2020-12-27 DIAGNOSIS — I25110 Atherosclerotic heart disease of native coronary artery with unstable angina pectoris: Secondary | ICD-10-CM

## 2020-12-27 DIAGNOSIS — I214 Non-ST elevation (NSTEMI) myocardial infarction: Secondary | ICD-10-CM

## 2020-12-27 DIAGNOSIS — R079 Chest pain, unspecified: Secondary | ICD-10-CM | POA: Diagnosis not present

## 2020-12-27 DIAGNOSIS — E1169 Type 2 diabetes mellitus with other specified complication: Secondary | ICD-10-CM | POA: Diagnosis not present

## 2020-12-27 DIAGNOSIS — N179 Acute kidney failure, unspecified: Secondary | ICD-10-CM

## 2020-12-27 DIAGNOSIS — E669 Obesity, unspecified: Secondary | ICD-10-CM | POA: Diagnosis not present

## 2020-12-27 LAB — PULMONARY FUNCTION TEST
FEF 25-75 Pre: 1.24 L/sec
FEF2575-%Pred-Pre: 46 %
FEV1-%Pred-Pre: 43 %
FEV1-Pre: 1.23 L
FEV1FVC-%Pred-Pre: 104 %
FEV6-%Pred-Pre: 42 %
FEV6-Pre: 1.5 L
FEV6FVC-%Pred-Pre: 104 %
FVC-%Pred-Pre: 41 %
FVC-Pre: 1.5 L
Pre FEV1/FVC ratio: 82 %
Pre FEV6/FVC Ratio: 100 %

## 2020-12-27 LAB — BASIC METABOLIC PANEL
Anion gap: 10 (ref 5–15)
BUN: 11 mg/dL (ref 6–20)
CO2: 27 mmol/L (ref 22–32)
Calcium: 8.6 mg/dL — ABNORMAL LOW (ref 8.9–10.3)
Chloride: 99 mmol/L (ref 98–111)
Creatinine, Ser: 1.52 mg/dL — ABNORMAL HIGH (ref 0.61–1.24)
GFR, Estimated: 52 mL/min — ABNORMAL LOW (ref >60)
Glucose, Bld: 168 mg/dL — ABNORMAL HIGH (ref 70–99)
Potassium: 3.9 mmol/L (ref 3.5–5.1)
Sodium: 136 mmol/L (ref 135–145)

## 2020-12-27 LAB — CBC
HCT: 40.2 % (ref 39.0–52.0)
Hemoglobin: 13.3 g/dL (ref 13.0–17.0)
MCH: 30.9 pg (ref 26.0–34.0)
MCHC: 33.1 g/dL (ref 30.0–36.0)
MCV: 93.3 fL (ref 80.0–100.0)
Platelets: 224 10*3/uL (ref 150–400)
RBC: 4.31 MIL/uL (ref 4.22–5.81)
RDW: 12.1 % (ref 11.5–15.5)
WBC: 10.1 10*3/uL (ref 4.0–10.5)
nRBC: 0 % (ref 0.0–0.2)

## 2020-12-27 LAB — GLUCOSE, CAPILLARY
Glucose-Capillary: 147 mg/dL — ABNORMAL HIGH (ref 70–99)
Glucose-Capillary: 143 mg/dL — ABNORMAL HIGH (ref 70–99)
Glucose-Capillary: 149 mg/dL — ABNORMAL HIGH (ref 70–99)
Glucose-Capillary: 174 mg/dL — ABNORMAL HIGH (ref 70–99)
Glucose-Capillary: 194 mg/dL — ABNORMAL HIGH (ref 70–99)

## 2020-12-27 LAB — HEPARIN LEVEL (UNFRACTIONATED): Heparin Unfractionated: 0.42 IU/mL (ref 0.30–0.70)

## 2020-12-27 IMAGING — CT CT HEAD W/O CM
3 of 4 series · 13 of 47 positions shown, 15 images · non-contrast
Comparison: None.

CLINICAL DATA: Neuro deficit, acute, stroke suspected

EXAM:
CT HEAD WITHOUT CONTRAST
TECHNIQUE: Contiguous axial images were obtained from the base of the skull
through the vertex without intravenous contrast.

[Series 3: head without · axial · non-contrast · 0.40mm/px · z∈[-102,+13]mm · 7 of 31 slices shown, 9 images]
[im 4/31  brain]
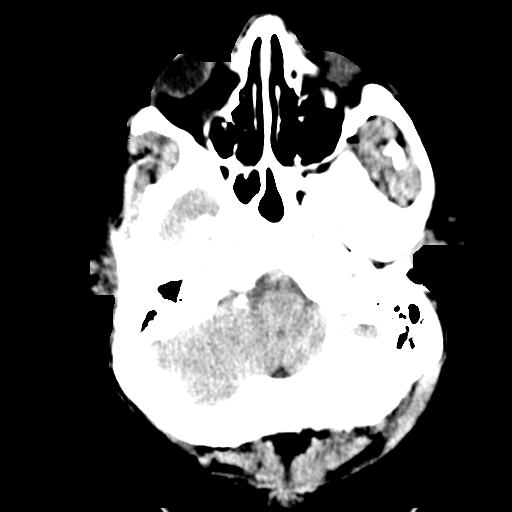
[im 4/31  bone]
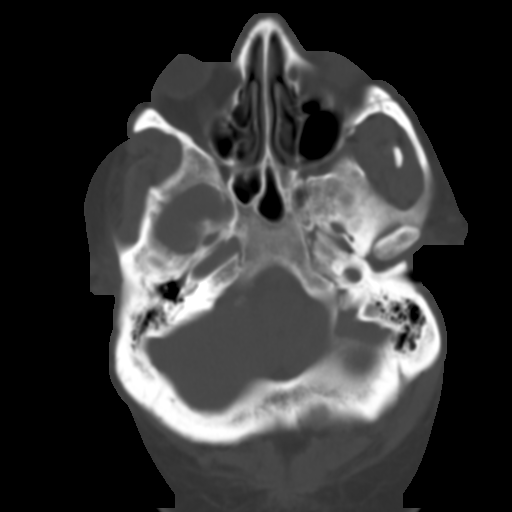
[im 8/31  brain]
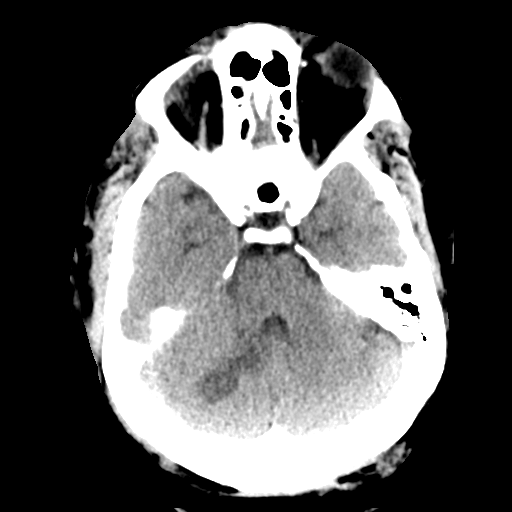
[im 12/31  brain]
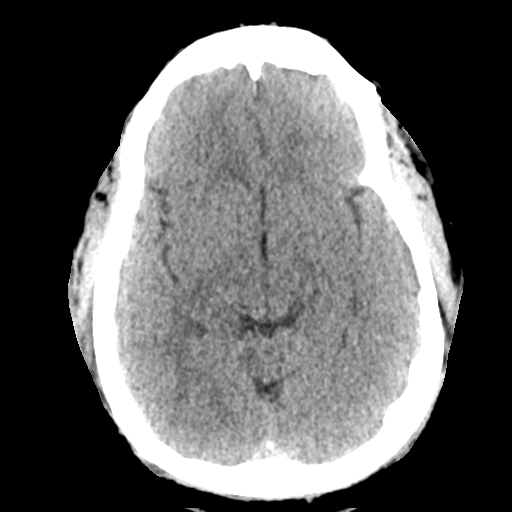
[im 16/31  brain]
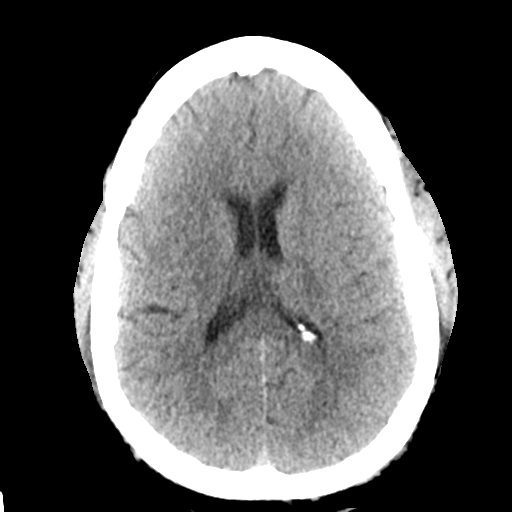
[im 19/31  brain]
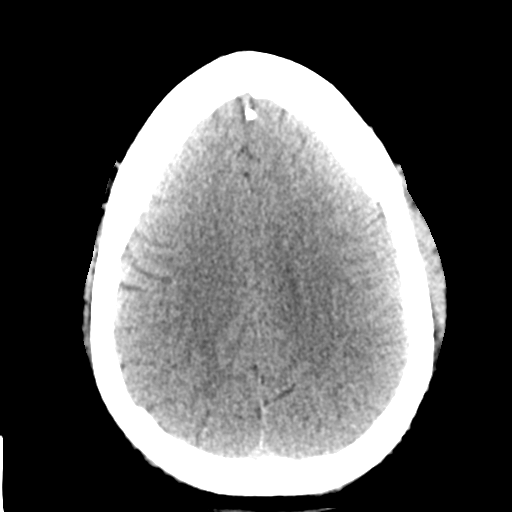
[im 19/31  bone]
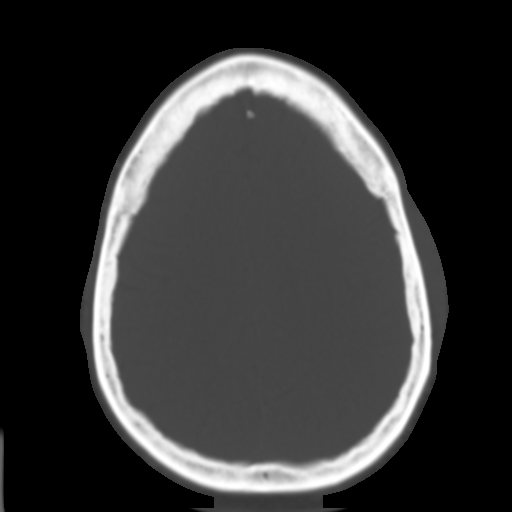
[im 23/31  brain]
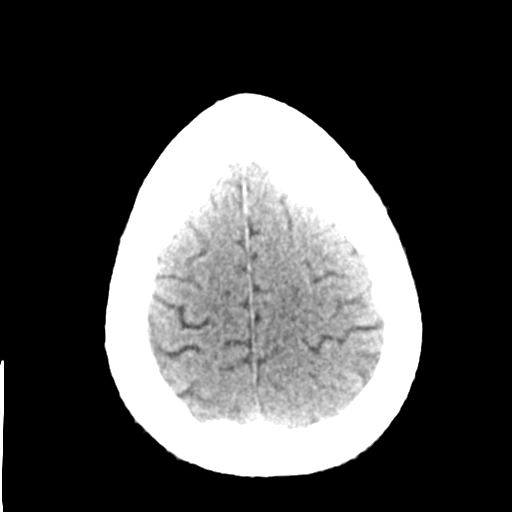
[im 27/31  brain]
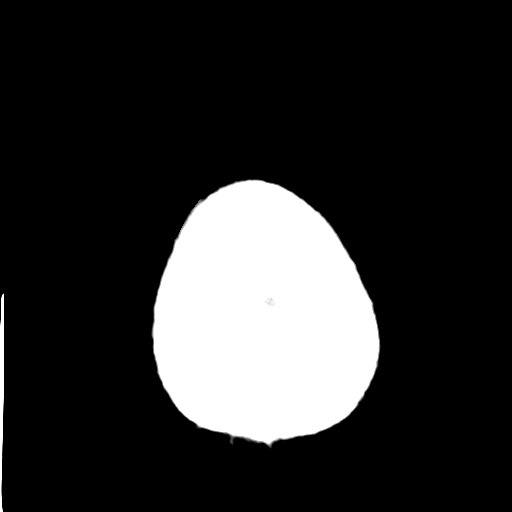

[Series 5: head without cor · coronal · non-contrast · 0.29mm/px · 3 of 70 slices shown]
[im 24/70  brain]
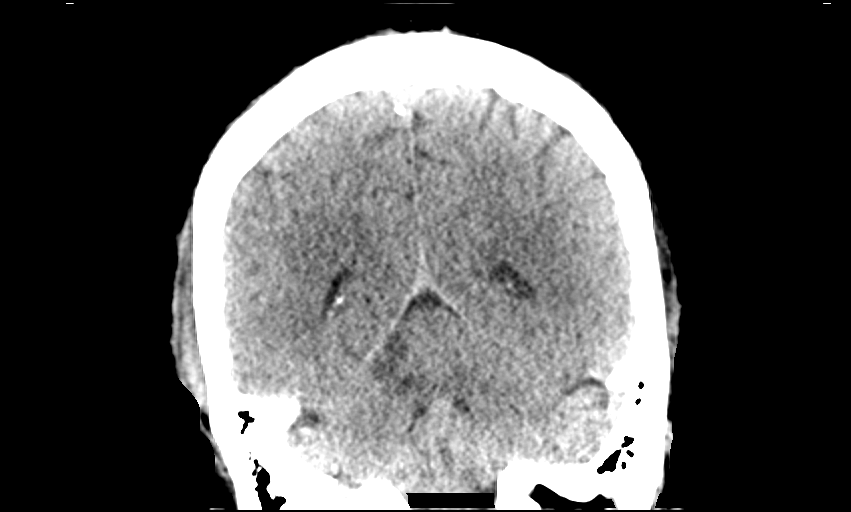
[im 31/70  brain]
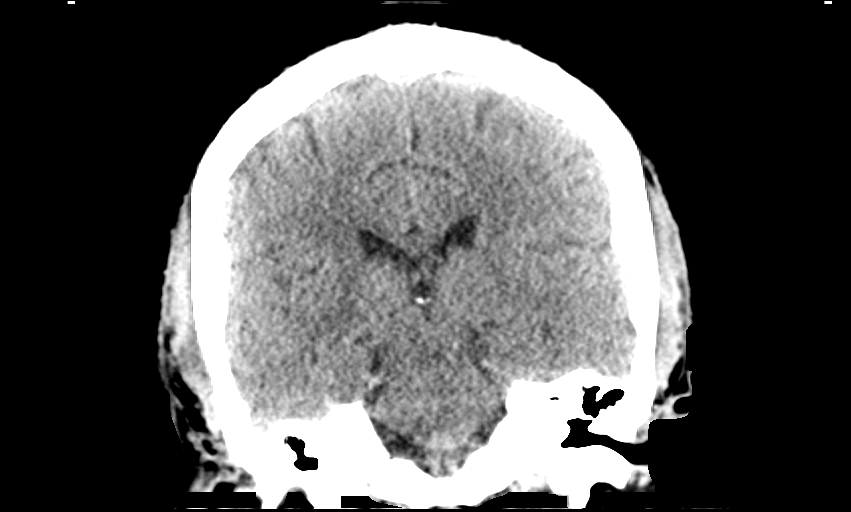
[im 39/70  brain]
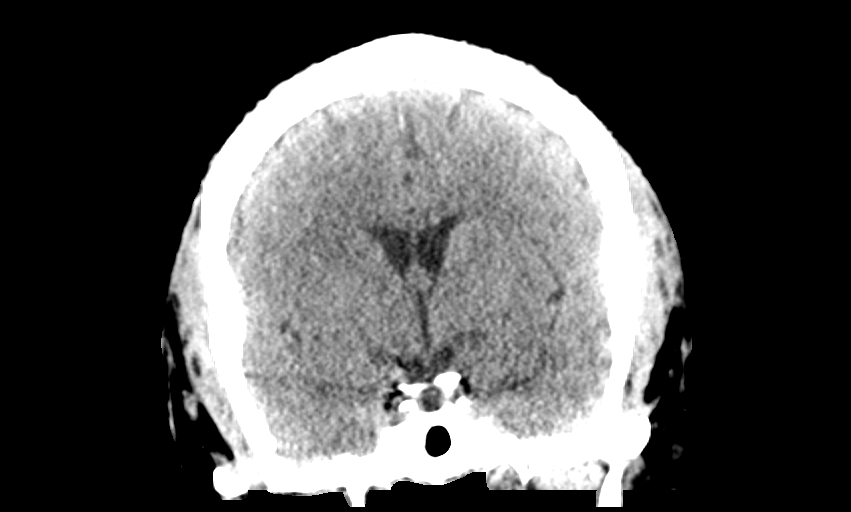

[Series 6: head without sag · sagittal · non-contrast · 0.32mm/px · 3 of 67 slices shown]
[im 23/67  brain]
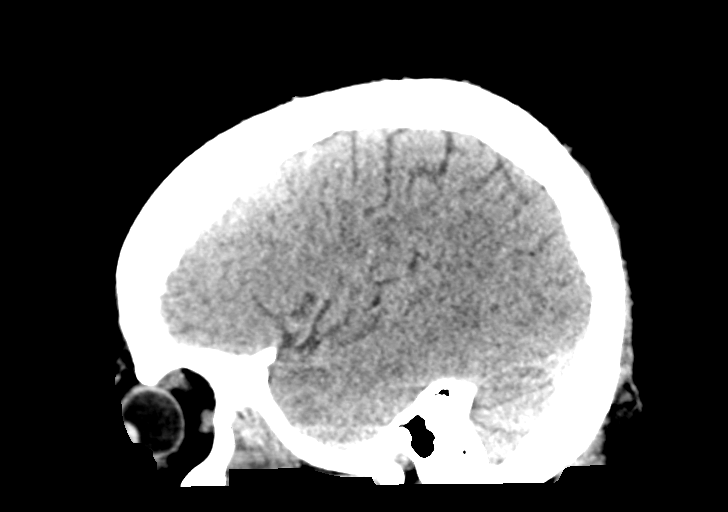
[im 34/67  brain]
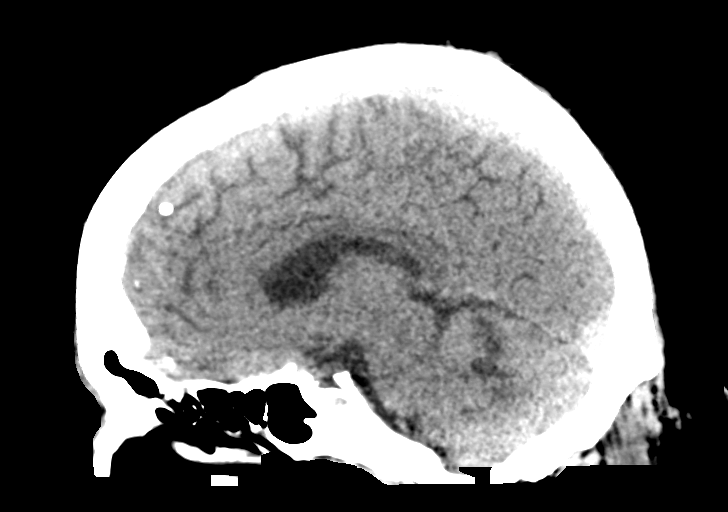
[im 45/67  brain]
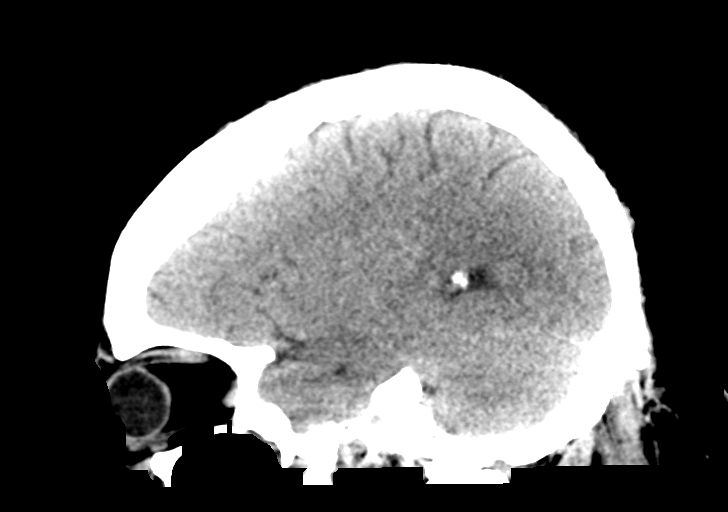

[13 of 47 positions shown; findings below may reference images not displayed]

FINDINGS: Brain: Hypodensity in the right cerebellum (series 3, image 9). No
other hypodensity to suggest acute infarct. No acute hemorrhage. No
mass, mass effect, or midline shift. No hydrocephalus or extra-axial
collection.

Vascular: No hyperdense vessel or unexpected calcification.

Skull: Normal. Negative for fracture or focal lesion.

Sinuses/Orbits: Mild mucosal thickening in the ethmoid air cells.
Orbits unremarkable.

Other: None.
IMPRESSION: Hypodensity in the right cerebellum, of indeterminate acuity but
likely chronic. Correlate with symptoms and consider MRI if
clinically indicated.

These results will be called to the ordering clinician or
representative by the Radiologist Assistant, and communication
documented in the PACS or [REDACTED].

## 2020-12-27 MED ORDER — AMLODIPINE BESYLATE 10 MG PO TABS
10.0000 mg | ORAL_TABLET | Freq: Every day | ORAL | Status: DC
  Administered 2020-12-27: 14:00:00 10 mg via ORAL
  Filled 2020-12-27: qty 1

## 2020-12-27 MED ORDER — CARVEDILOL 25 MG PO TABS: 25.0000 mg | ORAL_TABLET | Freq: Two times a day (BID) | ORAL | Status: DC

## 2020-12-27 MED ORDER — AMLODIPINE BESYLATE 5 MG PO TABS: 5.0000 mg | ORAL_TABLET | Freq: Every day | ORAL | Status: DC

## 2020-12-27 MED ORDER — CARVEDILOL 25 MG PO TABS
25.0000 mg | ORAL_TABLET | Freq: Two times a day (BID) | ORAL | Status: DC
  Administered 2020-12-27 – 2020-12-31 (×9): 25 mg via ORAL
  Filled 2020-12-27 (×9): qty 1

## 2020-12-27 NOTE — Progress Notes (Signed)
ANTICOAGULATION CONSULT NOTE - Follow Up Consult  Pharmacy Consult for heparin Indication:  NSTEMI  Labs: Recent Labs    12/24/20 1152 12/24/20 1352 12/24/20 1558 12/25/20 0031 12/25/20 1046 12/26/20 0232 12/27/20 0143 12/27/20 0812  HGB  --   --    < > 14.5  --  12.8* 13.3  --   HCT  --   --   --  42.4  --  37.4* 40.2  --   PLT  --   --   --  250  --  210 224  --   HEPARINUNFRC  --   --    < > 0.25* 0.38  --   --  0.42  CREATININE  --   --    < > 1.83* 1.77* 1.63* 1.52*  --   TROPONINIHS 215* 233*  --   --   --   --   --   --    < > = values in this interval not displayed.     Assessment: 60yo male s/p repeat cath done 11/16. Heparin resumed  10 hours post sheath removal.  Cath with significant multivessel CAD; recommended for CABG. Today the heparin level is 0.42 therapeutic on heparin drip 1450 units/hr.  CBC wnl stable.  No bleeding reported.   Goal of Therapy:  Heparin level 0.3-0.7 units/ml   Plan:  Continue heparin drip at 1450 units / hr Heparin level 8 hours after heparin starts Daily heparin level, CBC  Thank you Nicole Cella, RPh Clinical Pharmacist [phone] 12/27/2020,9:33 AM  Please check AMION for all [place] phone numbers After 10:00 PM, call [place] [phone]

## 2020-12-27 NOTE — Progress Notes (Addendum)
Progress Note  Patient Name: [name] Date of Encounter: 12/28/2020  [facility] Cardiologist: Freada Bergeron, MD   Subjective   Patient feels okay this morning. Overwhelmed by everything going on. Denies chest pain, SOB, blurred vision. Continues to have difficulty enunciating words but no weakness.   CT head revealed hypodensity of the right cerebellum. MRI brain revealed large infarct in the superior right cerebellum with mild surrounding edema and petechial hemorrhage. Also with scattered punctate infarcts in multiple vascular territories concerning for embolic source. Neuro consulted.   Inpatient Medications    Scheduled Meds:   stroke: mapping our early stages of recovery book   Does not apply Once   aspirin  81 mg Oral Daily   atorvastatin  80 mg Oral Daily   carvedilol  25 mg Oral BID WC   folic acid  1 mg Oral Daily   insulin aspart  0-15 Units Subcutaneous TID WC   insulin aspart  0-5 Units Subcutaneous QHS   insulin glargine-yfgn  15 Units Subcutaneous QHS   multivitamin with minerals  1 tablet Oral Daily   sodium chloride flush  3 mL Intravenous Q12H   sodium chloride flush  3 mL Intravenous Q12H   sodium chloride flush  3 mL Intravenous Q12H   thiamine  100 mg Oral Daily   Or   thiamine  100 mg Intravenous Daily   Continuous Infusions:  sodium chloride     sodium chloride     heparin 1,450 Units/hr (12/28/20 0356)   nitroGLYCERIN 60 mcg/min (12/28/20 0114)   PRN Meds: sodium chloride, sodium chloride, acetaminophen, ondansetron (ZOFRAN) IV, sodium chloride flush, sodium chloride flush   Vital Signs    Vitals:   12/27/20 1155 12/27/20 2013 12/27/20 2356 12/28/20 0525  BP: (!) 151/89 124/78 138/87 131/81  Pulse: 96 80 80 84  Resp: 20 18 20 20   Temp: 98.3 F (36.8 C) 98.6 F (37 C) 98.2 F (36.8 C) 98.4 F (36.9 C)  TempSrc: Oral Oral Oral Oral  SpO2: 94% 93% 91% 94%  Weight:      Height:        Intake/Output Summary (Last 24 hours)  at 12/28/2020 0603 Last data filed at 12/27/2020 1700 Gross per 24 hour  Intake 120 ml  Output 150 ml  Net -30 ml   Last 3 Weights 12/27/2020 12/26/2020 12/25/2020  Weight (lbs) 224 lb 3.3 oz 228 lb 9.6 oz 225 lb 11.2 oz  Weight (kg) 101.7 kg 103.692 kg 102.377 kg      Telemetry    Normal sinus rhythm- Personally Reviewed  ECG     No new tracing today- Personally Reviewed  Physical Exam   GEN: No acute distress. Comfortable  Neck: No JVD Cardiac: RRR, no murmurs, rubs, or gallops.  Respiratory: CTAB GI: Soft, nontender, non-distended  MS: No edema. Warm Neuro:  Mild slurred speech. Otherwise no frank deficits  Psych: Normal affect   Labs    High Sensitivity Troponin:   Recent Labs  Lab 12/24/20 0651 12/24/20 0851 12/24/20 1152 12/24/20 1352  TROPONINIHS 158* 171* 215* 233*     Chemistry Recent Labs  Lab 12/24/20 0651 12/25/20 0031 12/25/20 1046 12/26/20 0232 12/27/20 0143  NA 132* 134* 136 133* 136  K 4.0 3.8 3.5 3.8 3.9  CL 96* 99 101 101 99  CO2 27 27 30 26 27   GLUCOSE 432* 279* 164* 248* 168*  BUN 17 20 16 14 11   CREATININE 1.56* 1.83* 1.77* 1.63* 1.52*  CALCIUM 9.3 9.3 8.8* 8.2* 8.6*  PROT 7.4 6.3*  --   --   --   ALBUMIN 3.9 3.2*  --   --   --   AST 32 22  --   --   --   ALT 42 32  --   --   --   ALKPHOS 131* 95  --   --   --   BILITOT 0.5 0.8  --   --   --   GFRNONAA 51* 42* 43* 48* 52*  ANIONGAP 9 8 5 6 10     Lipids  Recent Labs  Lab 12/25/20 0031  CHOL 260*  TRIG 235*  HDL 48  LDLCALC 165*  CHOLHDL 5.4    Hematology Recent Labs  Lab 12/26/20 0232 12/27/20 0143 12/28/20 0216  WBC 9.0 10.1 10.0  RBC 4.04* 4.31 4.10*  HGB 12.8* 13.3 12.8*  HCT 37.4* 40.2 37.9*  MCV 92.6 93.3 92.4  MCH 31.7 30.9 31.2  MCHC 34.2 33.1 33.8  RDW 12.0 12.1 12.3  PLT 210 224 226   Thyroid No results for input(s): TSH, FREET4 in the last 168 hours.  BNP Recent Labs  Lab 12/24/20 0651  BNP 412.0*    DDimer No results for input(s): DDIMER  in the last 168 hours.   Radiology    CT HEAD WO CONTRAST (5MM)  Result Date: 12/27/2020 CLINICAL DATA:  Neuro deficit, acute, stroke suspected EXAM: CT HEAD WITHOUT CONTRAST TECHNIQUE: Contiguous axial images were obtained from the base of the skull through the vertex without intravenous contrast. COMPARISON:  None. FINDINGS: Brain: Hypodensity in the right cerebellum (series 3, image 9). No other hypodensity to suggest acute infarct. No acute hemorrhage. No mass, mass effect, or midline shift. No hydrocephalus or extra-axial collection. Vascular: No hyperdense vessel or unexpected calcification. Skull: Normal. Negative for fracture or focal lesion. Sinuses/Orbits: Mild mucosal thickening in the ethmoid air cells. Orbits unremarkable. Other: None. IMPRESSION: Hypodensity in the right cerebellum, of indeterminate acuity but likely chronic. Correlate with symptoms and consider MRI if clinically indicated. These results will be called to the ordering clinician or representative by the Radiologist Assistant, and communication documented in the PACS or [employer]. Electronically Signed   By: Merilyn Baba M.D.   On: 12/27/2020 20:06   MR BRAIN WO CONTRAST  Result Date: 12/28/2020 CLINICAL DATA:  Acute neurologic deficit EXAM: MRI HEAD WITHOUT CONTRAST TECHNIQUE: Multiplanar, multiecho pulse sequences of the brain and surrounding structures were obtained without intravenous contrast. COMPARISON:  None. FINDINGS: Brain: There is a large acute or early subacute infarct of the superior right cerebellum. Additionally, there are multiple scattered punctate acute infarcts scattered throughout both hemispheres in the anterior and posterior circulation. There is petechial hemorrhage in the area of the right cerebellar infarct with mild surrounding edema . there is an old left cerebellar infarct. No chronic microhemorrhage. Normal midline structures. Vascular: Major flow voids are preserved. Skull and upper  cervical spine: Normal calvarium and skull base. Visualized upper cervical spine and soft tissues are normal. Sinuses/Orbits:No paranasal sinus fluid levels or advanced mucosal thickening. No mastoid or middle ear effusion. Normal orbits. IMPRESSION: 1. Large acute infarct of the superior right cerebellum with mild surrounding edema and petechial hemorrhage. 2. Multiple scattered punctate acute infarcts in multiple vascular territories, consistent with a central cardiac or aortic embolic source. 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 Electronically Signed   By: Ulyses Jarred M.D.   On: 12/28/2020 02:05   CARDIAC CATHETERIZATION  Result  Date: 12/26/2020   Prox Cx lesion is 60% stenosed.   Prox Cx to Mid Cx lesion is 90% stenosed.   1st Mrg-2 lesion is 80% stenosed.   Mid LAD lesion is 50% stenosed.   Mid LAD to Dist LAD lesion is 80% stenosed.   2nd Diag lesion is 80% stenosed.   RPAV lesion is 50% stenosed.   RV Branch-1 lesion is 70% stenosed.   RV Branch-2 lesion is 80% stenosed.   Prox RCA-1 lesion is 90% stenosed.   Prox RCA to Mid RCA lesion is 95% stenosed.   Mid RCA lesion is 20% stenosed.   Prox RCA-2 lesion is 65% stenosed.   Dist RCA lesion is 20% stenosed.   1st Diag lesion is 90% stenosed.   Prox LAD lesion is 30% stenosed.   Previously placed Mid Cx to Dist Cx stent (unknown type) is  widely patent.   Previously placed 1st Mrg-1 stent (unknown type) is  widely patent.   LV end diastolic pressure is severely elevated. 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  In this diabetic male with significant diffuse disease in the LAD, left circumflex, and right coronary artery recommend surgical evaluation for consideration of CABG revascularization. RECOMMENDATION: Surgical consultation for consideration of CABG.  We will reinitiate heparin later this evening.  Patient continues to be on  intravenous nitroglycerin.  Increase medical therapy as blood pressure and heart rate allow.  Aggressive lipid-lowering therapy with target LDL in the 50s or below.    Cardiac Studies   MRI brain 12/27/20: FINDINGS: Brain: There is a large acute or early subacute infarct of the superior right cerebellum. Additionally, there are multiple scattered punctate acute infarcts scattered throughout both hemispheres in the anterior and posterior circulation. There is petechial hemorrhage in the area of the right cerebellar infarct with mild surrounding edema . there is an old left cerebellar infarct. No chronic microhemorrhage. Normal midline structures.   Vascular: Major flow voids are preserved.   Skull and upper cervical spine: Normal calvarium and skull base. Visualized upper cervical spine and soft tissues are normal.   Sinuses/Orbits:No paranasal sinus fluid levels or advanced mucosal thickening. No mastoid or middle ear effusion. Normal orbits.   IMPRESSION: 1. Large acute infarct of the superior right cerebellum with mild surrounding edema and petechial hemorrhage. 2. Multiple scattered punctate acute infarcts in multiple vascular territories, consistent with a central cardiac or aortic embolic source. 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  CT Head 12/27/20: IMPRESSION: Hypodensity in the right cerebellum, of indeterminate acuity but likely chronic. Correlate with symptoms and consider MRI if clinically indicated.   These results will be called to the ordering clinician or representative by the Radiologist Assistant, and communication documented in the PACS or [employer].  Cath 12/26/20:   Prox Cx lesion is 60% stenosed.   Prox Cx to Mid Cx lesion is 90% stenosed.   1st Mrg-2 lesion is 80% stenosed.   Mid LAD lesion is 50% stenosed.   Mid LAD to Dist LAD lesion is 80% stenosed.   2nd Diag lesion is 80% stenosed.   RPAV lesion is 50% stenosed.   RV Branch-1 lesion is 70%  stenosed.   RV Branch-2 lesion is 80% stenosed.   Prox RCA-1 lesion is 90% stenosed.   Prox RCA to Mid RCA lesion is 95% stenosed.   Mid RCA lesion is 20% stenosed.   Prox RCA-2  lesion is 65% stenosed.   Dist RCA lesion is 20% stenosed.   1st Diag lesion is 90% stenosed.   Prox LAD lesion is 30% stenosed.   Previously placed Mid Cx to Dist Cx stent (unknown type) is  widely patent.   Previously placed 1st Mrg-1 stent (unknown type) is  widely patent.   LV end diastolic pressure is severely elevated.   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  In this diabetic male with significant diffuse disease in the LAD, left circumflex, and right coronary artery recommend surgical evaluation for consideration of CABG revascularization.   RECOMMENDATION: Surgical consultation for consideration of CABG.  We will reinitiate heparin later this evening.  Patient continues to be on intravenous nitroglycerin.  Increase medical therapy as blood pressure and heart rate allow.  Aggressive lipid-lowering therapy with target LDL in the 50s or below.  Cath 12/25/20:     Prox RCA lesion is 90% stenosed.   RV Branch-1 lesion is 70% stenosed.   RV Branch-2 lesion is 80% stenosed.   Prox RCA to Mid RCA lesion is 90% stenosed.   Mid RCA lesion is 20% stenosed.   Mid RCA to Dist RCA lesion is 30% stenosed.   2nd RPL lesion is 50% stenosed.   There is severe CAD in a dominant right coronary artery with 90% proximal and mid stenoses, diffuse 70 and 80% stenoses in an RV marginal branch, and mid distal 20 and 30% stenoses.   Due to innominate vessel tortuosity as well as significant radial vasospasm, the left coronary system was unable to be cannulated despite attempts with multiple catheters and additional verapamil, intra-arterial nitroglycerin and IV nitroglycerin.   Will initiate IV heparin 8  hours post procedure and plan for completion of the diagnostic catheterization to the left coronary system tomorrow via the femoral approach and plan for probable PCI to the RCA depending upon left coronary findings.  TTE 12/24/20: IMPRESSIONS   1. Left ventricular ejection fraction, by estimation, is 55 to 60%. The  left ventricle has normal function. The left ventricle demonstrates  regional wall motion abnormalities (see scoring diagram/findings for  description). Left ventricular diastolic  parameters are consistent with Grade I diastolic dysfunction (impaired  relaxation).   2. Right ventricular systolic function is normal. The right ventricular  size is normal.   3. The mitral valve is normal in structure. Mild mitral valve  regurgitation. No evidence of mitral stenosis.   4. The aortic valve is normal in structure. Aortic valve regurgitation is  not visualized. Aortic valve sclerosis is present, with no evidence of  aortic valve stenosis.   5. The inferior vena cava is normal in size with greater than 50%  respiratory variability, suggesting right atrial pressure of 3 mmHg.   Echo 03/21/2015 LV EF: 55% -   60%   -------------------------------------------------------------------  Indications:      CAD of native vessels 414.01.   -------------------------------------------------------------------  History:   PMH:   Myocardial infarction.  Risk factors:  Current  tobacco use. Hypertension. Diabetes mellitus. Dyslipidemia.   -------------------------------------------------------------------  Study Conclusions   - Left ventricle: The cavity size was normal. There was moderate    concentric hypertrophy. Systolic function was normal. The    estimated ejection fraction was in the range of 55% to 60%.    Possible akinesis of the basalinferior myocardium. Doppler    parameters are consistent with abnormal  left ventricular    relaxation (grade 1 diastolic dysfunction).  - Aortic  valve: Trileaflet; mildly thickened, mildly calcified    leaflets.   Impressions:   - Compared to the prior study, there has been no significant    interval change.   Coronary Stent Intervention 12/24/20    Conclusion   Mid RCA lesion, 60% stenosed. Ost Ramus to Ramus lesion, 95% stenosed. Post intervention, there is a 0% residual stenosis. Dist Cx lesion, 95% stenosed. Post intervention, there is a 0% residual stenosis.   IMPRESSION:successful staged LAD, first obtuse marginal branch and left PLA branch PCI and drug-eluting stenting using synergy drug-eluting stents with a moderate mid dominant RCA stenosis found not to be physiologically significant. He has preserved LV function. This diagonal branch which was occluded at the time of LAD stenting 2 days ago had mild restoration of flow. He sheath was removed and a TR band was placed on the right wrist to achieve patent hemostasis. The patient left the lab in stable condition.He'll be hydrated for the next 12 hours, discharged home in the morning on dual antiplatelet therapy and I will see him back in the office in 2-3 weeks.   Diagnostic Dominance: Right Intervention        Patient Profile     60 y.o. 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 Course complicated by speech changes found to have large acute infarct of the superior right cerebellum with petechial hemorrhage and multiple scattered infarcts in multiple territories consistent with embolic stroke.  Assessment & Plan    #Acute Stroke: Patient complained of speech difficulty and enunciating his words with CT head revealing cerebellar lesion. Follow-up MRI with large acute infarct of the superior right cerebellum with mild edema and petechial  hemorrhage. There were also multiple scattered punctate acute infarcts in multiple vascular territories concerning for embolic source. Also with old cerebellar infarct.  Neuro consulted. -Management per Neuro -Plan for CTA of head and neck -Continue ASA 81mg  daily, lipitor 80mg  daily -Management of BP per neuro -Okay to stop heparin gtt from cards standpoint as >48h from MI and chest pain free -Tele  #NSTEMI:  #Multivessel CAD: Patient with known extensive multivessel CAD s/p multiple PCIs in 2017 as detailed above. Now presenting with classic anginal symptoms in the setting of stopping all medications. Cath showed 90% prox-mid RCA, 70-80% RV marginal branches and mild distal RCA disease. Unable to engage left system on initial cath and therefore repeat cath performed 11/16 which showed severe multivessel CAD now recommended for CABG. Course complicated by acute stroke as above -CT surgery consult for consideration of CABG; given acute stroke, will likely defer at this time -Okay to stop heparin gtt from cards standpoint as >48h from MI and chest pain free -Continue ASA 81mg  daily -Continue lipitor 80mg  daily -HTN management per Neuro given acute stroke  #HTN: Allowing permissive HTN given acute stroke. -Management of HTN per neuro given acute stroke -Will add back meds as able once cleared from neuro perspective  #HLD: TC 260, LDL 165, TG 235 not on statin -Continue lipitor 80mg  daily -Goal LDL<70 -Will need repeat lipids as out-patient  #DMII: Poorly controlled. -Management per primary   Total time of encounter: 45  minutes total time of encounter, including 30 minutes spent in face-to-face patient care on the date of this encounter. This time includes coordination of care and counseling regarding above mentioned problem list. Remainder of non-face-to-face time involved reviewing chart documents/testing relevant to the patient encounter and documentation in the medical record. I  have independently reviewed documentation from referring provider.      For questions or updates, please contact [place] Please consult [email or web address] for contact info under        Signed, Freada Bergeron, MD  12/28/2020, 6:03 AM

## 2020-12-27 NOTE — Progress Notes (Signed)
Progress Note    [name]  EGB:[id number] DOB: [birth year]  DOA: 12/24/2020 PCP: Patient, No Pcp Per (Inactive)    Brief Narrative:     Medical records reviewed and are as summarized below:  [name] is an 60 y.o. male who presented with complaints of midsternal chest pain/achiness that was accompanied by dyspnea. He stated that it was similar to the feeling he had when he had an MI.  S.p cath and now needs CABG.    Assessment/Plan:   Active Problems:   Chest pain   AKI (acute kidney injury) ([place])   Speech difficulty -says started after cath -trouble enunciating the END of his words, saying he just does not feel like himself -not sleeping well -speech changes not appreciated while speaking with patient, no other issues -CT scan of head  NSTEMI     - Cath showed 90% prox-mid RCA, 70-80% RV marginal branches and mild distal RCA disease. Unable to engage left system on initial cath and therefore repeat cath performed 11/16 which showed severe multivessel CAD now recommended for CABG   HTN urgency     - coreg     - per cards   AKI? Vs CKD     - renal US unremarkable     - hold lisinopril; watch nephrotoxins     - CR trending down   DM2     -A1C is 9/9-- improved from 5 years ago   HLD/CAD     - ASA, statin, BB   Tobacco abuse/EtOH abuse/Marijuana abuse     - counseled against further use     - no sign of withdrawal  obesity Body mass index is 34.6 kg/m.   Family Communication/Anticipated D/C date and plan/Code Status    Code Status: Full Code.  Disposition Plan: Status is: Inpatient  Remains inpatient appropriate because: cath 11/16         Medical Consultants:   cards     Subjective:   Wants to go home  Objective:    Vitals:   12/27/20 0040 12/27/20 0550 12/27/20 0620 12/27/20 1155  BP: (!) 150/93 (!) 180/97 (!) 162/103 (!) 151/89  Pulse: 84 (!) 101  96  Resp:    20  Temp: 98 F (36.7 C) 98.4 F (36.9 C)  98.3 F (36.8  C)  TempSrc: Oral Oral  Oral  SpO2:  94%  94%  Weight:  101.7 kg    Height:        Intake/Output Summary (Last 24 hours) at 12/27/2020 1233 Last data filed at 12/27/2020 0309 Gross per 24 hour  Intake 1480.25 ml  Output 1550 ml  Net -69.75 ml   Filed Weights   12/25/20 0028 12/26/20 0132 12/27/20 0550  Weight: 102.4 kg 103.7 kg 101.7 kg    Exam:  General: Appearance:    Obese male in no acute distress     Lungs:     Clear to auscultation bilaterally, respirations unlabored  Heart:    Normal heart rate. Normal rhythm. No murmurs, rubs, or gallops.    MS:   All extremities are intact.    Neurologic:   Awake, alert, oriented x 3. No apparent focal neurological           defect.      Data Reviewed:   I have personally reviewed following labs and imaging studies:  Labs: Labs show the following:   Basic Metabolic Panel: Recent Labs  Lab 12/24/20 0651 12/25/20 0031 12/25/20 1046 12/26/20  0232 12/27/20 0143  NA 132* 134* 136 133* 136  K 4.0 3.8 3.5 3.8 3.9  CL 96* 99 101 101 99  CO2 27 27 30 26 27   GLUCOSE 432* 279* 164* 248* 168*  BUN 17 20 16 14 11   CREATININE 1.56* 1.83* 1.77* 1.63* 1.52*  CALCIUM 9.3 9.3 8.8* 8.2* 8.6*   GFR Estimated Creatinine Clearance: 59.3 mL/min (A) (by C-G formula based on SCr of 1.52 mg/dL (H)). Liver Function Tests: Recent Labs  Lab 12/24/20 0651 12/25/20 0031  AST 32 22  ALT 42 32  ALKPHOS 131* 95  BILITOT 0.5 0.8  PROT 7.4 6.3*  ALBUMIN 3.9 3.2*   No results for input(s): LIPASE, AMYLASE in the last 168 hours. No results for input(s): AMMONIA in the last 168 hours. Coagulation profile No results for input(s): INR, PROTIME in the last 168 hours.  CBC: Recent Labs  Lab 12/24/20 0651 12/25/20 0031 12/26/20 0232 12/27/20 0143  WBC 10.8* 12.7* 9.0 10.1  NEUTROABS 5.7  --   --   --   HGB 16.2 14.5 12.8* 13.3  HCT 46.4 42.4 37.4* 40.2  MCV 90.8 92.0 92.6 93.3  PLT 242 250 210 224   Cardiac Enzymes: No results  for input(s): CKTOTAL, CKMB, CKMBINDEX, TROPONINI in the last 168 hours. BNP (last 3 results) No results for input(s): PROBNP in the last 8760 hours. CBG: Recent Labs  Lab 12/26/20 1127 12/26/20 1707 12/26/20 2109 12/27/20 0607 12/27/20 1154  GLUCAP 174* 177* 168* 147* 143*   D-Dimer: No results for input(s): DDIMER in the last 72 hours. Hgb A1c: Recent Labs    12/25/20 2105  HGBA1C 9.9*   Lipid Profile: Recent Labs    12/25/20 0031  CHOL 260*  HDL 48  LDLCALC 165*  TRIG 235*  CHOLHDL 5.4   Thyroid function studies: No results for input(s): TSH, T4TOTAL, T3FREE, THYROIDAB in the last 72 hours.  Invalid input(s): FREET3 Anemia work up: No results for input(s): VITAMINB12, FOLATE, FERRITIN, TIBC, IRON, RETICCTPCT in the last 72 hours. Sepsis Labs: Recent Labs  Lab 12/24/20 0651 12/25/20 0031 12/26/20 0232 12/27/20 0143  WBC 10.8* 12.7* 9.0 10.1    Microbiology Recent Results (from the past 240 hour(s))  Resp Panel by RT-PCR (Flu A&B, Covid) Nasopharyngeal Swab     Status: None   Collection Time: 12/24/20 12:57 PM   Specimen: Nasopharyngeal Swab; Nasopharyngeal(NP) swabs in vial transport medium  Result Value Ref Range Status   SARS Coronavirus 2 by RT PCR NEGATIVE NEGATIVE Final    Comment: (NOTE) SARS-CoV-2 target nucleic acids are NOT DETECTED.  The SARS-CoV-2 RNA is generally detectable in upper respiratory specimens during the acute phase of infection. The lowest concentration of SARS-CoV-2 viral copies this assay can detect is 138 copies/mL. A negative result does not preclude SARS-Cov-2 infection and should not be used as the sole basis for treatment or other patient management decisions. A negative result may occur with  improper specimen collection/handling, submission of specimen other than nasopharyngeal swab, presence of viral mutation(s) within the areas targeted by this assay, and inadequate number of viral copies(<138 copies/mL). A  negative result must be combined with clinical observations, patient history, and epidemiological information. The expected result is Negative.  Fact Sheet for Patients:  EntrepreneurPulse.com.au  Fact Sheet for Healthcare Providers:  IncredibleEmployment.be  This test is no t yet approved or cleared by the [place] FDA and  has been authorized for detection and/or diagnosis of SARS-CoV-2 by FDA under an Emergency  Use Authorization (EUA). This EUA will remain  in effect (meaning this test can be used) for the duration of the COVID-19 declaration under Section 564(b)(1) of the Act, 21 U.S.C.section 360bbb-3(b)(1), unless the authorization is terminated  or revoked sooner.       Influenza A by PCR NEGATIVE NEGATIVE Final   Influenza B by PCR NEGATIVE NEGATIVE Final    Comment: (NOTE) The Xpert Xpress SARS-CoV-2/FLU/RSV plus assay is intended as an aid in the diagnosis of influenza from Nasopharyngeal swab specimens and should not be used as a sole basis for treatment. Nasal washings and aspirates are unacceptable for Xpert Xpress SARS-CoV-2/FLU/RSV testing.  Fact Sheet for Patients: EntrepreneurPulse.com.au  Fact Sheet for Healthcare Providers: IncredibleEmployment.be  This test is not yet approved or cleared by the [place] FDA and has been authorized for detection and/or diagnosis of SARS-CoV-2 by FDA under an Emergency Use Authorization (EUA). This EUA will remain in effect (meaning this test can be used) for the duration of the COVID-19 declaration under Section 564(b)(1) of the Act, 21 U.S.C. section 360bbb-3(b)(1), unless the authorization is terminated or revoked.  Performed at [facility], [place] [address], [place], [place] [address]     Procedures and diagnostic studies:  CARDIAC CATHETERIZATION  Result Date: 12/26/2020   Prox Cx lesion is 60% stenosed.   Prox  Cx to Mid Cx lesion is 90% stenosed.   1st Mrg-2 lesion is 80% stenosed.   Mid LAD lesion is 50% stenosed.   Mid LAD to Dist LAD lesion is 80% stenosed.   2nd Diag lesion is 80% stenosed.   RPAV lesion is 50% stenosed.   RV Branch-1 lesion is 70% stenosed.   RV Branch-2 lesion is 80% stenosed.   Prox RCA-1 lesion is 90% stenosed.   Prox RCA to Mid RCA lesion is 95% stenosed.   Mid RCA lesion is 20% stenosed.   Prox RCA-2 lesion is 65% stenosed.   Dist RCA lesion is 20% stenosed.   1st Diag lesion is 90% stenosed.   Prox LAD lesion is 30% stenosed.   Previously placed Mid Cx to Dist Cx stent (unknown type) is  widely patent.   Previously placed 1st Mrg-1 stent (unknown type) is  widely patent.   LV end diastolic pressure is severely elevated. 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  In this diabetic male with significant diffuse disease in the LAD, left circumflex, and right coronary artery recommend surgical evaluation for consideration of CABG revascularization. RECOMMENDATION: Surgical consultation for consideration of CABG.  We will reinitiate heparin later this evening.  Patient continues to be on intravenous nitroglycerin.  Increase medical therapy as blood pressure and heart rate allow.  Aggressive lipid-lowering therapy with target LDL in the 50s or below.   CARDIAC CATHETERIZATION  Result Date: 12/25/2020   Prox RCA lesion is 90% stenosed.   RV Branch-1 lesion is 70% stenosed.   RV Branch-2 lesion is 80% stenosed.   Prox RCA to Mid RCA lesion is 90% stenosed.   Mid RCA lesion is 20% stenosed.   Mid RCA to Dist RCA lesion is 30% stenosed.   2nd RPL lesion is 50% stenosed. There is severe CAD in a dominant right coronary artery with 90% proximal and mid stenoses, diffuse 70 and 80% stenoses in an RV marginal branch, and mid distal 20 and 30% stenoses. Due to innominate vessel tortuosity  as  well as significant radial vasospasm, the left coronary system was unable to be cannulated despite attempts with multiple catheters and additional verapamil, intra-arterial nitroglycerin and IV nitroglycerin. Will initiate IV heparin 8 hours post procedure and plan for completion of the diagnostic catheterization to the left coronary system tomorrow via the femoral approach and plan for probable PCI to the RCA depending upon left coronary findings.    Medications:    amLODipine  10 mg Oral Daily   aspirin  81 mg Oral Daily   atorvastatin  80 mg Oral Daily   carvedilol  25 mg Oral BID WC   folic acid  1 mg Oral Daily   insulin aspart  0-15 Units Subcutaneous TID WC   insulin aspart  0-5 Units Subcutaneous QHS   insulin glargine-yfgn  15 Units Subcutaneous QHS   multivitamin with minerals  1 tablet Oral Daily   sodium chloride flush  3 mL Intravenous Q12H   sodium chloride flush  3 mL Intravenous Q12H   sodium chloride flush  3 mL Intravenous Q12H   thiamine  100 mg Oral Daily   Or   thiamine  100 mg Intravenous Daily   Continuous Infusions:  sodium chloride     sodium chloride     heparin 1,450 Units/hr (12/27/20 0040)   nitroGLYCERIN 60 mcg/min (12/27/20 0600)     LOS: 3 days   Geradine Girt  [facility]   How to contact the [facility] Attending or Consulting provider [place] or covering provider during after hours [place], for this patient?  Check the care team in [facility] and look for a) attending/consulting [facility] provider listed and b) the [facility] team listed Log into [email or web address] and use [place]'s universal password to access. If you do not have the password, please contact the hospital operator. Locate the [facility] provider you are looking for under [facility] and page to a number that you can be directly reached. If you still have difficulty reaching the provider, please page the [facility] (Director on Call) for the Hospitalists listed on amion for assistance.  12/27/2020,  12:33 PM

## 2020-12-27 NOTE — Consult Note (Addendum)
[address]       [place] [address]             [phone]        Russel Vanderweele [place] Medical Record #[id number] Date of Birth: [birth year]  Referring: Dr. Claiborne Billings, MD Primary Care: Patient, No Pcp Per (Inactive) Primary Cardiologist:Heather Renae Fickle, MD  Chief Complaint:    Chief Complaint  Patient presents with   Chest Pain   Shortness of Breath  Reason for consultation: Coronary artery disease  History of Present Illness:     This is a 60 year old male with a past medical history of coronary artery disease (previous stents), hypertension, hyperlipidemia, DM, marijuana, tobacco and alcohol abuse who presented to the ED with complaints of chest pain and shortness of breath that had been going on for several days prior. In the ED, he was hypertensive and started on a Nitro drip. EKG showed ST depression in V3/V4. Initial Troponin I (high sensitivity) was 158 and went up to 233. He ruled in for a NSTEMI. He underwent an echo on 11/14 that showed LVEF 55-60%, left ventricle demonstrates regional wall motion abnormalities, no significant valvular abnormalities, and no pericardial effusion. He then underwent a cardiac catheterization that showed severe multivessel CAD with previous stents to the LAD, distal Circumflex. Dr. Cyndia Bent has been consulted for the consideration of coronary artery bypass grafting surgery. At the time of my exam, he denies chest pain or shortness of breath. Patient is right hand dominant.  Current Activity/ Functional Status: Patient is independent with mobility/ambulation, transfers, ADL's, IADL's.   Zubrod Score: At the time of surgery this patient's most appropriate activity status/level should be described as: []     0    Normal activity, no symptoms [x]     1    Restricted in physical strenuous activity but ambulatory, able to do out light work []     2    Ambulatory and capable of self care, unable to do work activities, up and about       more than 50%  Of the time                            []     3    Only limited self care, in bed greater than 50% of waking hours []     4    Completely disabled, no self care, confined to bed or chair []     5    Moribund  Past Medical History:  Diagnosis Date   CAD (coronary artery disease)    a. 03/2015 NSTEMI: LHC with severe 3V CAD  (70% mid RCA, 95% OM1, 90% distal LCx, 90% OM3, 80% prox LAD and 90% ost D1) s/p DES to mLAD w/ small dissction Rx with DES, staged ost Ramus PCI/DES and dLCx s/p PCI/DES    Diabetes mellitus type 2 in obese ([place])    Diverticulosis    Hypercholesteremia    Hypertension    Obesity    Tobacco abuse     Past Surgical History:  Procedure Laterality Date   CARDIAC CATHETERIZATION N/A 03/19/2015   Procedure: Left Heart Cath and Coronary Angiography;  Surgeon: Lorretta Harp, MD;  Location: [facility];  Service: Cardiovascular;  Laterality: N/A;   CARDIAC CATHETERIZATION N/A 03/20/2015   Procedure: Coronary Stent Intervention;  Surgeon: Lorretta Harp, MD;  Location: [facility];  Service: Cardiovascular;  Laterality: N/A;   CARDIAC CATHETERIZATION N/A 03/22/2015   Procedure: Coronary Stent Intervention;  Surgeon: Lorretta Harp, MD;  Location: [facility];  Service: Cardiovascular;  Laterality: N/A;   LEFT HEART CATH AND CORONARY ANGIOGRAPHY N/A 12/25/2020   Procedure: LEFT HEART CATH AND CORONARY ANGIOGRAPHY;  Surgeon: Troy Sine, MD;  Location: [facility];  Service: Cardiovascular;  Laterality: N/A;   LEFT HEART CATH AND CORONARY ANGIOGRAPHY N/A 12/26/2020   Procedure: LEFT HEART CATH AND CORONARY ANGIOGRAPHY;  Surgeon: Troy Sine, MD;  Location: [facility];  Service: Cardiovascular;  Laterality: N/A;    Social History   Tobacco Use  Smoking Status Every Day   Packs/day: 0.50   Types: Cigarettes in the past. 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 His 38 year old uncle lives with him.   Allergies  Allergen Reactions   Aspirin Other (See Comments)    Upset stomach      Current Facility-Administered Medications  Medication Dose Route Frequency Provider Last Rate Last Admin   0.9 %  sodium chloride infusion  250 mL Intravenous PRN Troy Sine, MD       0.9 %  sodium chloride infusion  250 mL Intravenous PRN Troy Sine, MD       acetaminophen (TYLENOL) tablet 650 mg  650 mg Oral Q4H PRN Troy Sine, MD       amLODipine (NORVASC) tablet 10 mg  10 mg Oral Daily Freada Bergeron, MD       aspirin chewable tablet 81 mg  81 mg Oral Daily Troy Sine, MD   81 mg at 12/27/20 0950   atorvastatin (LIPITOR) tablet 80 mg  80 mg Oral Daily Kyle, Tyrone A, DO   80 mg at 12/27/20 0950   carvedilol (COREG) tablet 25 mg  25 mg Oral BID WC Freada Bergeron, MD   25 mg at 20/25/42 7062   folic acid (FOLVITE) tablet 1 mg  1 mg Oral Daily Marylyn Ishihara, Tyrone A, DO   1 mg at 12/27/20 0950   heparin ADULT infusion 100 units/mL (25000 units/238mL)  1,450 Units/hr Intravenous Continuous Eulogio Bear U, DO 14.5 mL/hr at 12/27/20 0040 1,450 Units/hr at 12/27/20 0040   insulin aspart (novoLOG) injection 0-15 Units  0-15 Units Subcutaneous TID WC Kyle, Tyrone A, DO   2 Units at 12/27/20 0615   insulin aspart (novoLOG) injection 0-5 Units  0-5 Units Subcutaneous QHS Kyle, Tyrone A, DO   5 Units at 12/25/20 2213   insulin glargine-yfgn (SEMGLEE) injection 15 Units  15 Units Subcutaneous QHS Kyle, Tyrone A, DO   15 Units at 12/26/20 2135   LORazepam (ATIVAN) tablet 1-4 mg  1-4 mg Oral Q1H PRN Cherylann Ratel A, DO       Or   LORazepam (ATIVAN) injection 1-4 mg  1-4 mg Intravenous Q1H PRN Marylyn Ishihara, Tyrone A, DO       multivitamin with minerals tablet 1 tablet  1 tablet Oral Daily Kyle, Tyrone A, DO   1 tablet at 12/27/20 0950   nitroGLYCERIN 50 mg in dextrose 5  % 250 mL (0.2 mg/mL) infusion  0-200 mcg/min Intravenous Continuous Kyle, Tyrone A, DO 18 mL/hr at 12/27/20 0600 60 mcg/min at 12/27/20 0600   ondansetron (ZOFRAN) injection  4 mg  4 mg Intravenous Q6H PRN Troy Sine, MD       sodium chloride flush (NS) 0.9 % injection 3 mL  3 mL Intravenous Q12H Kyle, Tyrone A, DO   3 mL at 12/27/20 1020   sodium chloride flush (NS) 0.9 % injection 3 mL  3 mL Intravenous Q12H Troy Sine, MD   3 mL at 12/27/20 1019   sodium chloride flush (NS) 0.9 % injection 3 mL  3 mL Intravenous PRN Troy Sine, MD       sodium chloride flush (NS) 0.9 % injection 3 mL  3 mL Intravenous Q12H Troy Sine, MD   3 mL at 12/27/20 1020   sodium chloride flush (NS) 0.9 % injection 3 mL  3 mL Intravenous PRN Troy Sine, MD       thiamine tablet 100 mg  100 mg Oral Daily Kyle, Tyrone A, DO   100 mg at 12/27/20 1517   Or   thiamine (B-1) injection 100 mg  100 mg Intravenous Daily Kyle, Tyrone A, DO        Medications Prior to Admission  Medication Sig Dispense Refill Last Dose   Aspirin-Acetaminophen-Caffeine (GOODY HEADACHE PO) Take 1 packet by mouth daily as needed (pain/headache).   unk   lisinopril (ZESTRIL) 20 MG tablet Take 20 mg by mouth daily.   12/23/2020   aspirin EC 81 MG EC tablet Take 1 tablet (81 mg total) by mouth daily. (Patient not taking: Reported on 12/24/2020)   Not Taking   atorvastatin (LIPITOR) 80 MG tablet Take 1 tablet (80 mg total) by mouth daily at 6 PM. (Patient not taking: No sig reported) 90 tablet 3 Not Taking   carvedilol (COREG) 3.125 MG tablet TAKE 1 TABLET BY MOUTH TWICE DAILY WITH A MEAL 30 tablet 0    insulin NPH-regular Human (70-30) 100 UNIT/ML injection Inject 46 Units into the skin daily. (Patient not taking: Reported on 12/24/2020)   Not Taking   nitroGLYCERIN (NITROSTAT) 0.4 MG SL tablet Place 1 tablet (0.4 mg total) under the tongue every 5 (five) minutes as needed for chest pain (CP or SOB). 25 tablet 12    prasugrel  (EFFIENT) 10 MG TABS tablet Take 1 tablet (10 mg total) by mouth daily. (Patient not taking: No sig reported) 30 tablet 12 Not Taking    Review of Systems:      Cardiac Review of Systems: Y or  [  N  ]= no  Chest Pain [   Y-upon admission ]  Resting SOB [  N ] Exertional SOB  [ Y-upon admission ]    Pedal Edema [ N  ]    Syncope  [place]lick[place]  ]   Presyncope [  N ]  General Review of Systems: [Y] = yes [N  ]=no Constitional:  fatigue [ Y ]; nausea [  N]; night sweats [  ]; fever [ N ]; or chills [  ]                                                                Eye : Amaurosis fugax[ N ]; Resp: cough [  N];  wheezing[ N ];  hemoptysis[N  ];  GI:  vomiting[ N ];  hematochezia [ N ] GU: hematuria[ N ];                Skin: rash, swelling[ N ];,  Neuro: TIAN[  ];  stroke[  N];   seizures[ N ];   Endocrine: diabetes[ Y ];  thyroid dysfunction[ N ];             Physical Exam: BP (!) 162/103   Pulse (!) 101   Temp 98.4 F (36.9 C) (Oral)   Resp 20   Ht 5' 7.5" (1.715 m)   Wt 101.7 kg   SpO2 94%   BMI 34.60 kg/m    General appearance: alert, cooperative, and no distress Head: Normocephalic, without obvious abnormality, atraumatic Neck: no carotid bruit, no JVD, and supple, symmetrical, trachea midline Resp: clear to auscultation bilaterally Cardio: RRR, non murmur GI: Soft, mildly obese, non tender, bowel sounds present Extremities: Palpable pulses bilaterally, no LE edema Neurologic: Grossly normal  Diagnostic Studies & Laboratory data:     Recent Radiology Findings:   CARDIAC CATHETERIZATION  Result Date: 12/26/2020   Prox Cx lesion is 60% stenosed.   Prox Cx to Mid Cx lesion is 90% stenosed.   1st Mrg-2 lesion is 80% stenosed.   Mid LAD lesion is 50% stenosed.   Mid LAD to Dist LAD lesion is 80% stenosed.   2nd Diag lesion is 80% stenosed.   RPAV lesion is 50% stenosed.   RV Branch-1 lesion is 70% stenosed.   RV Branch-2 lesion is 80% stenosed.   Prox RCA-1 lesion is 90% stenosed.    Prox RCA to Mid RCA lesion is 95% stenosed.   Mid RCA lesion is 20% stenosed.   Prox RCA-2 lesion is 65% stenosed.   Dist RCA lesion is 20% stenosed.   1st Diag lesion is 90% stenosed.   Prox LAD lesion is 30% stenosed.   Previously placed Mid Cx to Dist Cx stent (unknown type) is  widely patent.   Previously placed 1st Mrg-1 stent (unknown type) is  widely patent.   LV end diastolic pressure is severely elevated. 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  In this diabetic male with significant diffuse disease in the LAD, left circumflex, and right coronary artery recommend surgical evaluation for consideration of CABG revascularization. RECOMMENDATION: Surgical consultation for consideration of CABG.  We will reinitiate heparin later this evening.  Patient continues to be on intravenous nitroglycerin.  Increase medical therapy as blood pressure and heart rate allow.  Aggressive lipid-lowering therapy with target LDL in the 50s or below.   CARDIAC CATHETERIZATION  Result Date: 12/25/2020   Prox RCA lesion is 90% stenosed.   RV Branch-1 lesion is 70% stenosed.   RV Branch-2 lesion is 80% stenosed.   Prox RCA to Mid RCA lesion is 90% stenosed.   Mid RCA lesion is 20% stenosed.   Mid RCA to Dist RCA lesion is 30% stenosed.   2nd RPL lesion is 50% stenosed. There is severe CAD in a dominant right coronary artery with 90% proximal and mid stenoses, diffuse 70 and 80% stenoses in an RV marginal branch, and mid distal 20 and 30% stenoses. Due to innominate vessel tortuosity as well as significant radial vasospasm, the left coronary system was unable to be cannulated despite attempts with multiple catheters and additional verapamil, intra-arterial nitroglycerin and IV nitroglycerin. Will initiate IV heparin 8 hours post procedure and plan  for completion of the diagnostic catheterization to the left  coronary system tomorrow via the femoral approach and plan for probable PCI to the RCA depending upon left coronary findings.    Diagnostic Dominance: Right Intervention    I have independently reviewed the above radiologic studies and discussed with the patient   Recent Lab Findings: Lab Results  Component Value Date   WBC 10.1 12/27/2020   HGB 13.3 12/27/2020   HCT 40.2 12/27/2020   PLT 224 12/27/2020   GLUCOSE 168 (H) 12/27/2020   CHOL 260 (H) 12/25/2020   TRIG 235 (H) 12/25/2020   HDL 48 12/25/2020   LDLCALC 165 (H) 12/25/2020   ALT 32 12/25/2020   AST 22 12/25/2020   NA 136 12/27/2020   K 3.9 12/27/2020   CL 99 12/27/2020   CREATININE 1.52 (H) 12/27/2020   BUN 11 12/27/2020   CO2 27 12/27/2020   INR 1.05 03/19/2015   HGBA1C 9.9 (H) 12/25/2020   Assessment / Plan:   S/p NSTEMI, coronary artery disease-on Nitro drip. Heparin drip will be restarted this evening. Patient states he would prefer to not have surgery on this admission. He wants to discuss other options, if there are any, with surgeon, cardiologist. His best option will likely be surgery, however. 2. History of hyperlipidemia-on Atrovastatin 80 mg daily 3. History of DM-On Insulin. HGA1C 9.9 4. AKI-creatinine upon admission was 1.56. Of note, his creatinine has been less than 1.4 since February of 2017. His creatinine was decreased to 1.52 this am. 5. History of hypertension-On Amlodipine 10 mg daily and Coreg 25 mg bid  I  spent 15 minutes counseling the patient face to face.   Lars Pinks PA-C 12/27/2020 11:52 AM   Chart reviewed, patient examined, agree with above. He tells me that he was feeling fairly well until 1 week prior to presentation when he began having intermittent episodes of chest tightness and shortness of breath that would resolve spontaneously and occurred with rest and exertion.  He presented after a severe episode that was not resolving and ruled in for non-ST segment elevation  MI.  His cardiac catheterization shows severe three-vessel coronary disease with graftable vessels.  Unfortunately he developed some neurologic changes after his catheterization which were really not noticeable to anyone except for him.  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  There were multiple scattered punctate acute infarcts in multiple vascular territories consistent with an embolic source.  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  He is doing well clinically without chest pain or shortness of breath. He has not had any difficulty with ambulation or balance.  He seems neurologically intact to my exam and his speech seems normal.  I agree that coronary bypass graft surgery would be the best treatment for this patient although he will still be at risk for further strokes since we have to manipulate his aorta.  I would like to get a CTA of the chest preoperatively to evaluate his aorta further.  I suspect that neurology will want to wait for an interval prior to performing bypass surgery to allow his brain to heal to decrease the risk of hemorrhagic transformation.  We will await their further recommendations.  He has stage III chronic kidney disease but I think the benefit of a preoperative CTA of the chest outweighs the  risk.

## 2020-12-27 NOTE — Progress Notes (Addendum)
HOSPITAL MEDICINE OVERNIGHT EVENT NOTE    Notified by radiology that patient's noncontrast CT of the head reveals a hypodensity in the right cerebellum of indeterminate acuity.  Unclear as to whether this has something to do with patient's current symptoms of intermittent slurred speech.  Discussed patient's current symptoms with nursing who states the patient is still complaining about slurring of his words.  Patient is not exhibiting any other obvious neurologic deficit.    We will therefore proceed with noncontrast MRI brain to better evaluate.  Vernelle Emerald  MD [facility]   ADDENDUM 11/18 315am  Noncontrast Brain MRI reviewed - Multiple concerning findings including Large infarct in the superior right cerebellum with mild surrounding edema and petechial hemorrhage.  There are also multiople scattered punctate acute infarcts in multiple vascular territories concerning for embolic strokes.    Patient evaluated.  Patient is AAO x 3 and continues to only complain of occasional slurred speech.  Neuro exam is essentially unremarkable with cranial nerves intact and no focal motor or sensory deficit.   Paging Neurology for consultation and assistance in management.  Holding Heparin therapy for now until this can be further reviewed with Neurology considering petechial hemorrhage.  Discontinued Amlodipine for now for more permissive hypertension.  Initiating neurologic checks.  Obtaining MRA head/neck.  Since TTE done 11/14 made no mention of thrombus will repeat a focused Echo with bubble study.  If this reveals no defect then a TEE will need to be arranged.    Sherryll Burger Gilad Dugger   ADDENDUM 11/18 3:48AM  Spoke to Dr. Cheral Marker who will see Mr. Furtick . His assistance is appreciated.  He recommends CTA head/neck as opposed to MRI, recommends continued Heparin infusion . Orders placed.  Sherryll Burger Inayah Woodin

## 2020-12-28 ENCOUNTER — Other Ambulatory Visit: Payer: Self-pay | Admitting: Cardiology

## 2020-12-28 ENCOUNTER — Inpatient Hospital Stay (HOSPITAL_COMMUNITY): Payer: BC Managed Care – PPO

## 2020-12-28 DIAGNOSIS — I6389 Other cerebral infarction: Secondary | ICD-10-CM

## 2020-12-28 DIAGNOSIS — I639 Cerebral infarction, unspecified: Secondary | ICD-10-CM | POA: Diagnosis not present

## 2020-12-28 DIAGNOSIS — I214 Non-ST elevation (NSTEMI) myocardial infarction: Secondary | ICD-10-CM | POA: Diagnosis not present

## 2020-12-28 DIAGNOSIS — I6340 Cerebral infarction due to embolism of unspecified cerebral artery: Secondary | ICD-10-CM | POA: Diagnosis not present

## 2020-12-28 DIAGNOSIS — E1169 Type 2 diabetes mellitus with other specified complication: Secondary | ICD-10-CM | POA: Diagnosis not present

## 2020-12-28 DIAGNOSIS — I25119 Atherosclerotic heart disease of native coronary artery with unspecified angina pectoris: Secondary | ICD-10-CM | POA: Diagnosis not present

## 2020-12-28 DIAGNOSIS — E669 Obesity, unspecified: Secondary | ICD-10-CM | POA: Diagnosis not present

## 2020-12-28 DIAGNOSIS — I6310 Cerebral infarction due to embolism of unspecified precerebral artery: Secondary | ICD-10-CM

## 2020-12-28 DIAGNOSIS — Z01810 Encounter for preprocedural cardiovascular examination: Secondary | ICD-10-CM

## 2020-12-28 DIAGNOSIS — N179 Acute kidney failure, unspecified: Secondary | ICD-10-CM | POA: Diagnosis not present

## 2020-12-28 DIAGNOSIS — Z8679 Personal history of other diseases of the circulatory system: Secondary | ICD-10-CM | POA: Insufficient documentation

## 2020-12-28 LAB — HEPARIN LEVEL (UNFRACTIONATED): Heparin Unfractionated: 0.7 IU/mL (ref 0.30–0.70)

## 2020-12-28 LAB — CBC
HCT: 37.9 % — ABNORMAL LOW (ref 39.0–52.0)
Hemoglobin: 12.8 g/dL — ABNORMAL LOW (ref 13.0–17.0)
MCH: 31.2 pg (ref 26.0–34.0)
MCHC: 33.8 g/dL (ref 30.0–36.0)
MCV: 92.4 fL (ref 80.0–100.0)
Platelets: 226 10*3/uL (ref 150–400)
RBC: 4.1 MIL/uL — ABNORMAL LOW (ref 4.22–5.81)
RDW: 12.3 % (ref 11.5–15.5)
WBC: 10 10*3/uL (ref 4.0–10.5)
nRBC: 0 % (ref 0.0–0.2)

## 2020-12-28 LAB — GLUCOSE, CAPILLARY
Glucose-Capillary: 249 mg/dL — ABNORMAL HIGH (ref 70–99)
Glucose-Capillary: 183 mg/dL — ABNORMAL HIGH (ref 70–99)
Glucose-Capillary: 179 mg/dL — ABNORMAL HIGH (ref 70–99)
Glucose-Capillary: 181 mg/dL — ABNORMAL HIGH (ref 70–99)

## 2020-12-28 IMAGING — CT CT ANGIO HEAD-NECK (W OR W/O PERF)
1 of 11 series · 5 of 33 positions shown · IV contrast (omnipaque)
Comparison: [DATE] CT head, [DATE] MRI head.

CLINICAL DATA: Neuro deficit, acute, stroke suspected



[Series 12: cta neck axial · axial · 0.39mm/px · z∈[-257,-13]mm · 5 of 367 slices shown]
[im 62/367  soft-tissue]
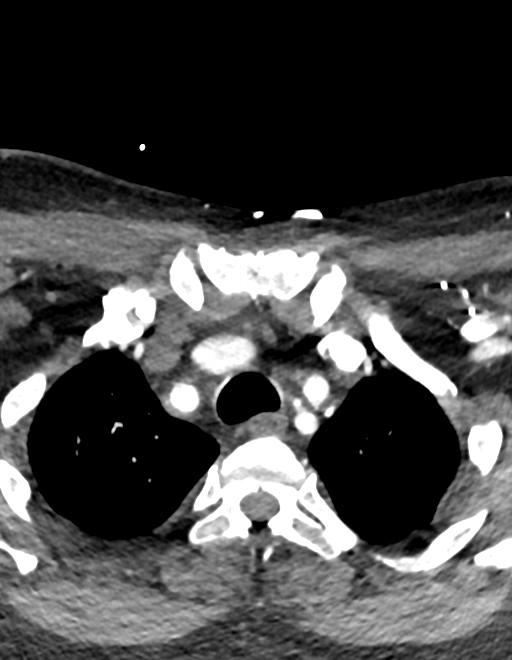
[im 123/367  bone]
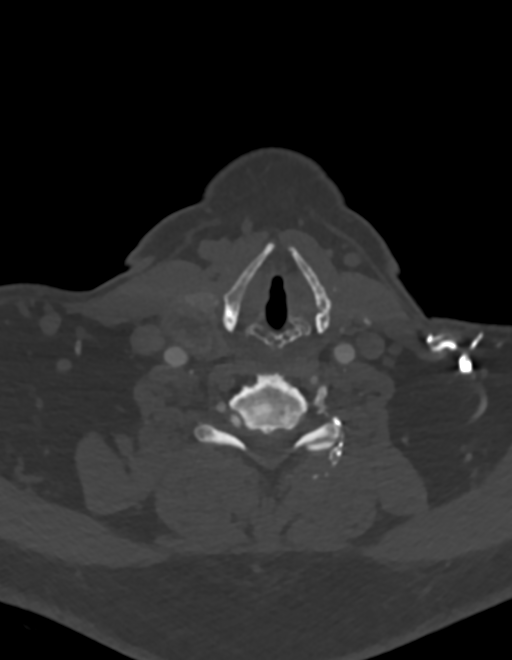
[im 184/367  soft-tissue]
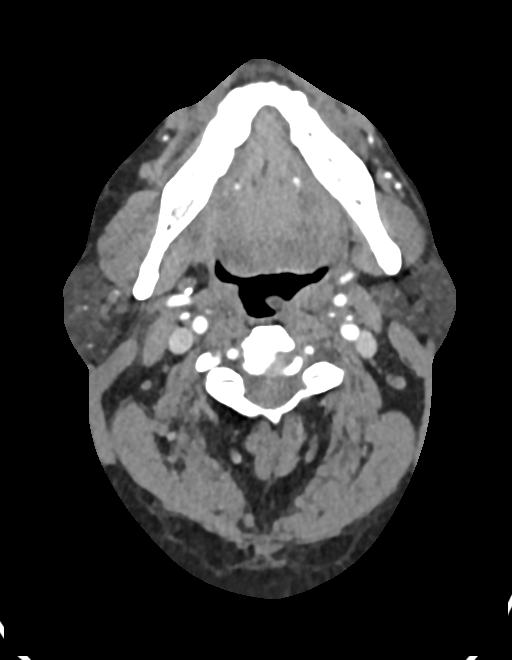
[im 245/367  bone]
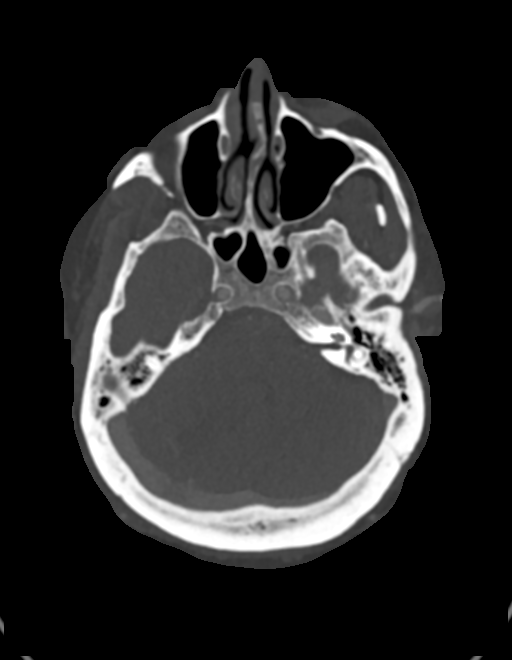
[im 306/367  soft-tissue]
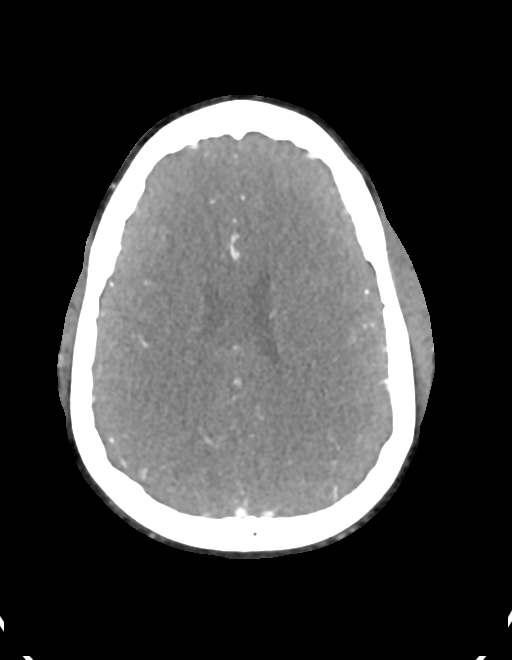

[5 of 33 positions shown; findings below may reference images not displayed]

FINDINGS: CT HEAD FINDINGS

Brain: Redemonstrated hypodensity in the right cerebellar
hemisphere, consistent with the infarct seen on MRI, with new areas
of increased density, likely petechial hemorrhage, also consistent
with the MRI. No new areas of hypodensity; no CT correlate is seen
for other punctate infarcts seen on the MRI. No mass, mass effect,
or midline shift

Vascular: No hyperdense vessel.

Skull: Normal. Negative for fracture or focal lesion.

Sinuses: Mucous retention cysts in the left maxillary sinus and left
ethmoid air cells. The mastoids are well aerated.

Orbits: No acute finding.

Review of the MIP images confirms the above findings

CTA NECK FINDINGS

Aortic arch: 3 vessel arch with a common origin of the
brachiocephalic and left common carotid arteries, as well as an
aortic origin the left vertebral artery. Imaged portion shows no
evidence of aneurysm or dissection. Moderate stenosis of the left
vertebral artery origin, secondary to calcified and noncalcified
plaque (series 12, image 327).

Right carotid system: No evidence of dissection, stenosis (50% or
greater) or occlusion. Calcified and noncalcified plaque at the
bifurcation, with less than 50% stenosis.

Left carotid system: No evidence of dissection, stenosis (50% or
greater) or occlusion. Calcified and noncalcified plaque at the
bifurcation, with less than 50% stenosis.

Vertebral arteries: Moderate stenosis at the origin of the left
vertebral artery. The left vertebral artery is otherwise patent
through the V3 segment. Mild narrowing at the origin of the right
vertebral artery, which is otherwise patent through the V3 segment.

Skeleton: Degenerative changes in the cervical spine. No acute
osseous abnormality.

Other neck: Significant enlargement of the right thyroid lobe, with
multiple hypoattenuating nodules.

Upper chest: Small bilateral pleural effusions with associated
atelectasis. Multiple prominent mediastinal lymph nodes (series 12,
image 362).

Review of the MIP images confirms the above findings

CTA HEAD FINDINGS

Anterior circulation: Multifocal calcified and noncalcified
narrowing of the right intracranial ICA, which is severe in the
cavernous (series 12, image 113), and ophthalmic/supraclinoid
segments (series 12, image 108). Severe narrowing in the distal left
cavernous ICA (series 12, images 109-111) and supraclinoid segments
(series 12, image 102.

Mild to moderate narrowing of the distal right A1. Normal left A1.
Normal anterior communicating artery. There is mild irregularity of
the distal ACAs, but they remain patent.

Mild irregularity in the M1 segments, which are patent. Normal
bifurcations. Distal MCA segments are mildly irregular but perfused.

Posterior circulation: Severe narrowing in the left V4, with focal
nonopacification (series 12, image 145), with distal reconstitution
proximal to the vertebrobasilar junction, possibly retrograde flow.
Moderate focal narrowing in the right V4 segment (series 12, images

Severe narrowing of the basilar artery (series 12, image 122),
without complete occlusion. Likely severe focal narrowing at the
origin of the right superior cerebellar artery. The origin of the
left superior cerebellar artery appears patent.

The right posterior communicating artery is patent, with moderate
focal stenosis near the origin (series 12, image 106). Diminutive
right P1. The left posterior is patent. Moderate stenosis at the
origin of the left P1. Diminutive and somewhat irregular P2 and P3
segments bilaterally.

Venous sinuses: As permitted by contrast timing, patent.

Anatomic variants: None significant

Review of the MIP images confirms the above findings
IMPRESSION: 1. Redemonstrated hypodensity in the right cerebellar hemisphere,
consistent with the infarct seen on MRI, with petechial hemorrhage.
No new infarct is seen.
2. Severe narrowing of the basilar artery, without complete
occlusion.
3. Focal narrowing at the origin of the right superior cerebellar
artery.
4. Severe narrowing in the left V4 segment, with focal occlusion and
distal reconstitution, likely retrograde. Moderate focal narrowing
is also seen in the right V4 segment.
5. Multifocal narrowing of the bilateral intracranial internal
carotid arteries, which is severe in the cavernous, ophthalmic, and
supraclinoid segments, as described above.
6. Moderate stenosis at the origin of the left vertebral artery,
which originates from the aorta. Mild narrowing at the origin of the
right vertebral artery. No other hemodynamically significant
stenosis in the neck.
7. Small bilateral pleural effusions and prominent mediastinal lymph
nodes. Consider CT of the chest for further evaluation.
8. Enlarged right thyroid lobe with multiple hypoattenuating
nodules. This has not previously been evaluated, an ultrasound of
the thyroid is recommended.

## 2020-12-28 IMAGING — MR MR HEAD W/O CM
12 of 13 series · 44 of 48 positions shown · non-contrast
Comparison: None.

CLINICAL DATA: Acute neurologic deficit

EXAM:
MRI HEAD WITHOUT CONTRAST
TECHNIQUE: Multiplanar, multiecho pulse sequences of the brain and surrounding
structures were obtained without intravenous contrast.

[Series 5: DWI · axial · 3.0mm · 0.92mm/px · z∈[-93,+72]mm · 7 of 112 slices shown (1 of 4)]
[im 1/112]
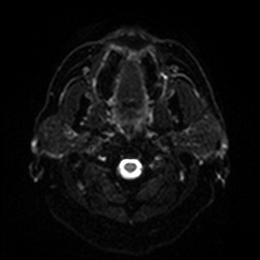
[im 19/112]
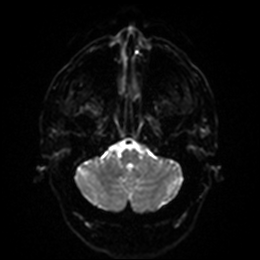
[im 38/112]
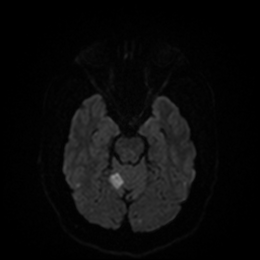
[im 56/112]
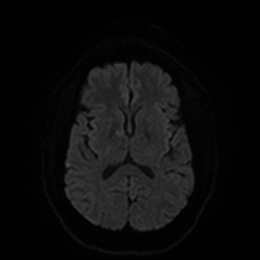
[im 75/112]
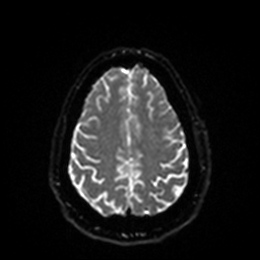
[im 93/112]
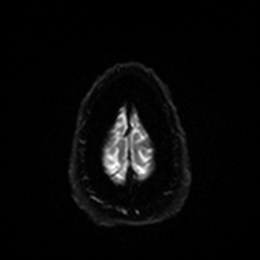
[im 112/112]
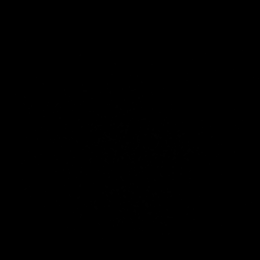

[Series 6: DWI · axial · 3.0mm · 0.92mm/px · z∈[-93,+69]mm · 4 of 55 slices shown (2 of 4)]
[im 1/55]
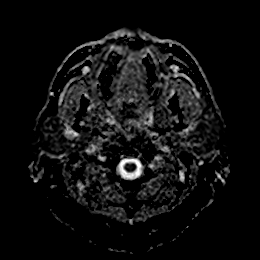
[im 19/55]
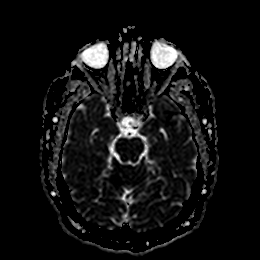
[im 37/55]
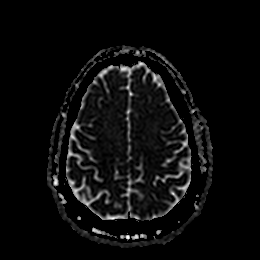
[im 55/55]
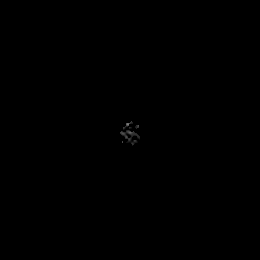

[Series 7: DWI · coronal · 4.0mm · 0.88mm/px · 6 of 84 slices shown (3 of 4)]
[im 1/84]
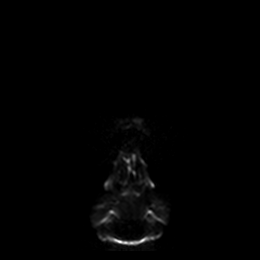
[im 17/84]
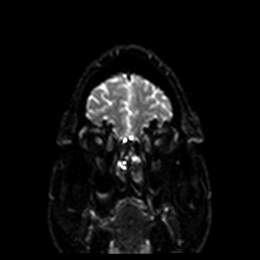
[im 34/84]
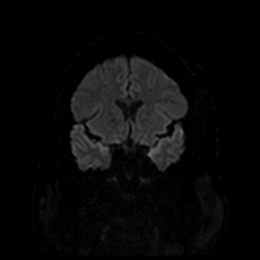
[im 50/84]
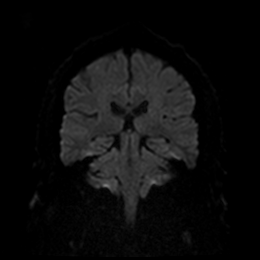
[im 67/84]
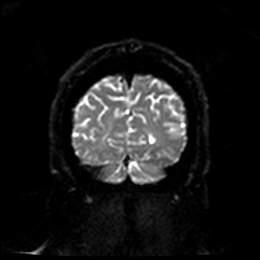
[im 84/84]
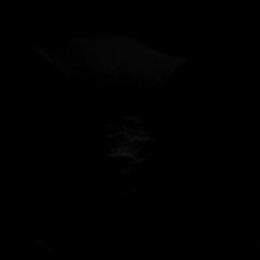

[Series 8: DWI · coronal · 4.0mm · 0.88mm/px · 3 of 42 slices shown (4 of 4)]
[im 1/42]
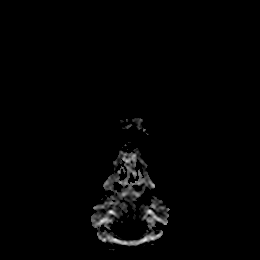
[im 21/42]
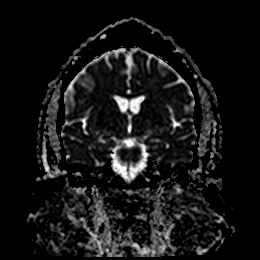
[im 42/42]
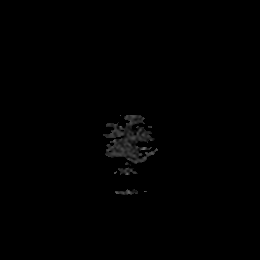

[Series 9: T1 · sagittal · 5.0mm · 0.78mm/px · 2 of 29 slices shown]
[im 1/29]
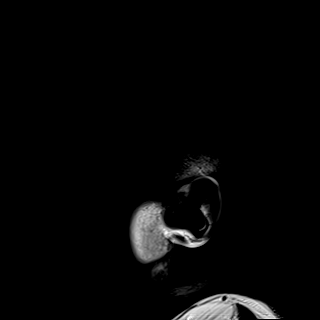
[im 29/29]
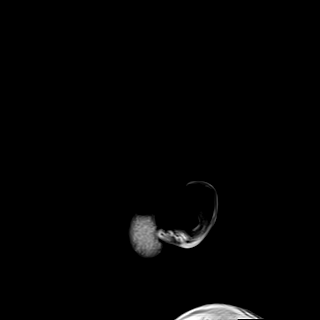

[Series 10: T2 · axial · 5.0mm · 0.75mm/px · z∈[-101,+72]mm · 2 of 30 slices shown (1 of 2)]
[im 1/30]
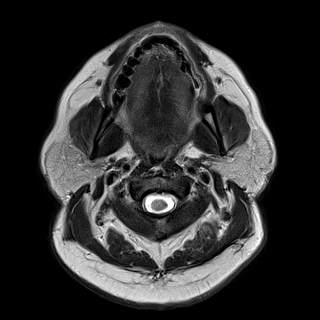
[im 30/30]
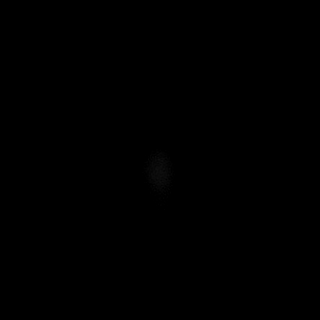

[Series 11: FLAIR · axial · 5.0mm · 0.47mm/px · z∈[-100,+74]mm · 2 of 30 slices shown]
[im 1/30]
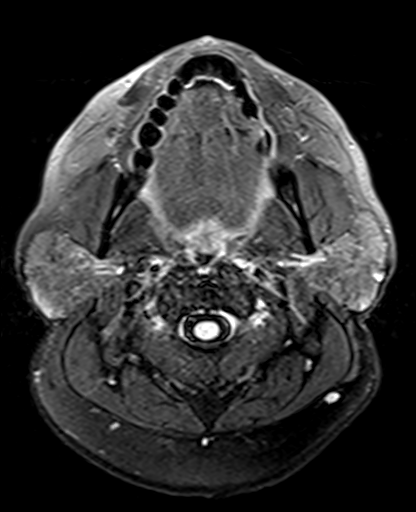
[im 30/30]
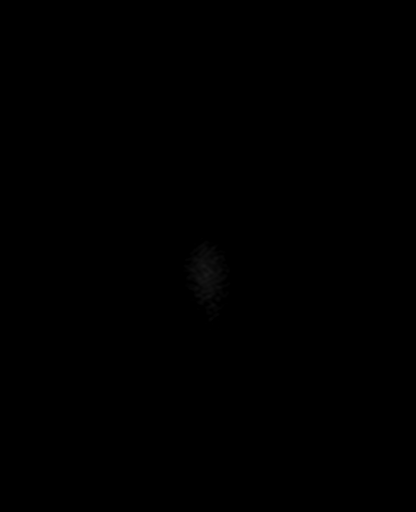

[Series 12: mag_images · axial · 3.0mm · 0.94mm/px · z∈[-104,+73]mm · 4 of 60 slices shown]
[im 1/60]
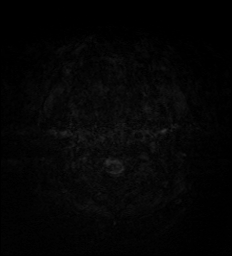
[im 20/60]
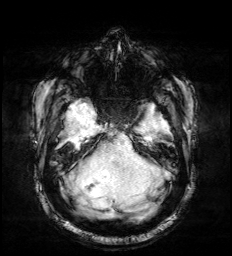
[im 40/60]
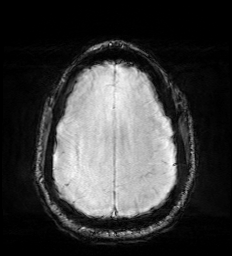
[im 60/60]
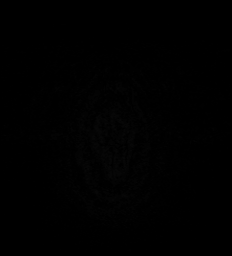

[Series 13: pha_images · axial · 3.0mm · 0.94mm/px · z∈[-104,+73]mm · 4 of 59 slices shown]
[im 1/59]
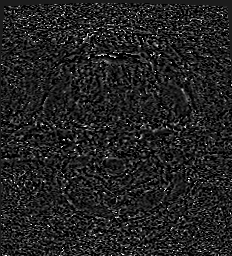
[im 20/59]
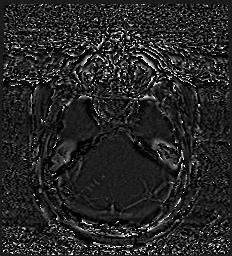
[im 39/59]
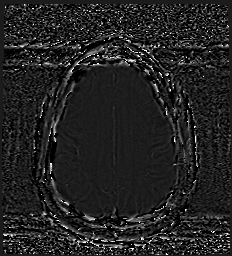
[im 59/59]
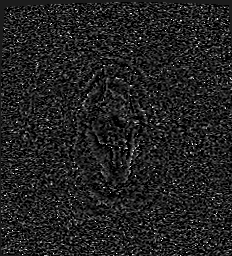

[Series 14: swi_images · axial · 3.0mm · 0.94mm/px · z∈[-104,+73]mm · 4 of 60 slices shown]
[im 1/60]
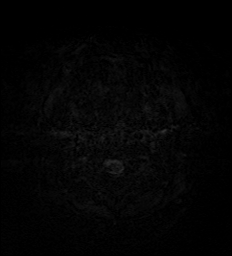
[im 20/60]
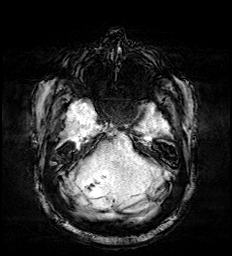
[im 40/60]
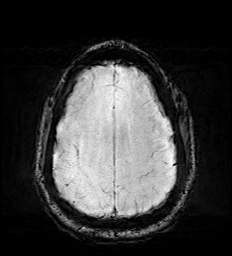
[im 60/60]
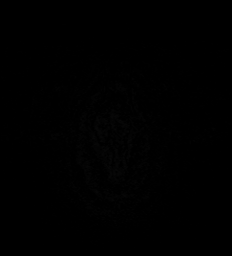

[Series 15: mip_images(sw) · axial · 24.0mm · 0.94mm/px · z∈[-93,+62]mm · 4 of 53 slices shown]
[im 1/53]
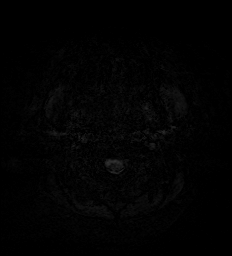
[im 18/53]
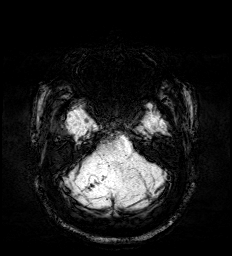
[im 35/53]
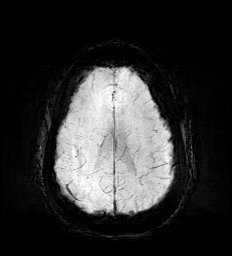
[im 53/53]
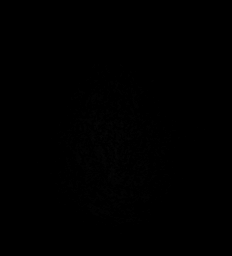

[Series 17: T2 · coronal · 5.0mm · 0.34mm/px · 2 of 34 slices shown (2 of 2)]
[im 1/34]
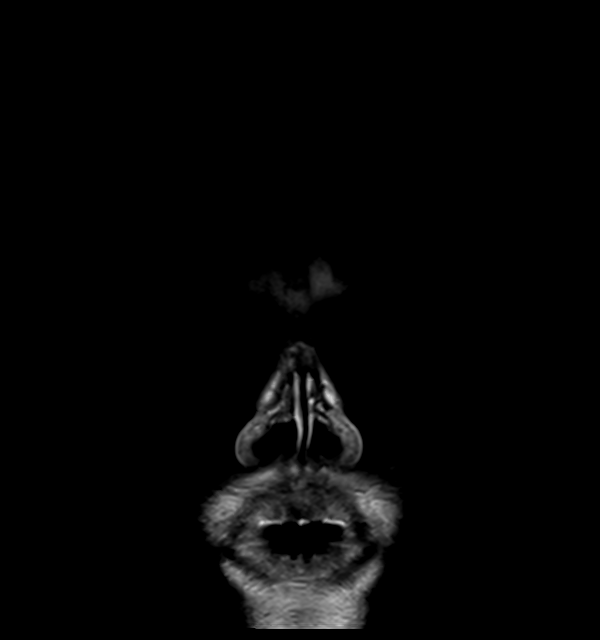
[im 34/34]
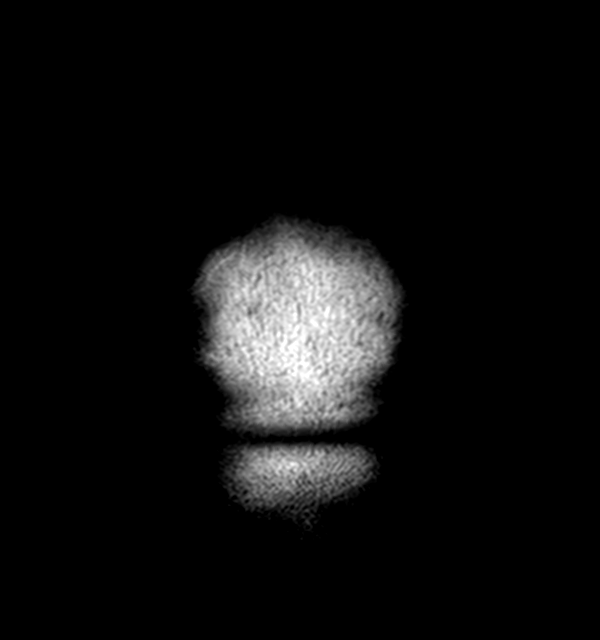

[44 of 48 positions shown; findings below may reference images not displayed]

FINDINGS: Brain: There is a large acute or early subacute infarct of the
superior right cerebellum. Additionally, there are multiple
scattered punctate acute infarcts scattered throughout both
hemispheres in the anterior and posterior circulation. There is
petechial hemorrhage in the area of the right cerebellar infarct
with mild surrounding edema . there is an old left cerebellar
infarct. No chronic microhemorrhage. Normal midline structures.

Vascular: Major flow voids are preserved.

Skull and upper cervical spine: Normal calvarium and skull base.
Visualized upper cervical spine and soft tissues are normal.

Sinuses/Orbits:No paranasal sinus fluid levels or advanced mucosal
thickening. No mastoid or middle ear effusion. Normal orbits.
IMPRESSION: 1. Large acute infarct of the superior right cerebellum with mild
surrounding edema and petechial hemorrhage.
2. Multiple scattered punctate acute infarcts in multiple vascular
territories, consistent with a central cardiac or aortic embolic
source.
3. Old left cerebellar infarct.

## 2020-12-28 MED ORDER — IOHEXOL 350 MG/ML SOLN
80.0000 mL | Freq: Once | INTRAVENOUS | Status: AC | PRN
  Administered 2020-12-28: 80 mL via INTRAVENOUS

## 2020-12-28 MED ORDER — CLOPIDOGREL BISULFATE 75 MG PO TABS
75.0000 mg | ORAL_TABLET | Freq: Every day | ORAL | Status: DC
  Administered 2020-12-29 – 2021-01-01 (×4): 75 mg via ORAL
  Filled 2020-12-28 (×4): qty 1

## 2020-12-28 MED ORDER — STROKE: EARLY STAGES OF RECOVERY BOOK
Freq: Once | Status: DC
  Filled 2020-12-28: qty 1

## 2020-12-28 MED ORDER — ASPIRIN EC 325 MG PO TBEC
325.0000 mg | DELAYED_RELEASE_TABLET | Freq: Every day | ORAL | Status: DC
  Administered 2020-12-29 – 2021-01-01 (×4): 325 mg via ORAL
  Filled 2020-12-28 (×4): qty 1

## 2020-12-28 MED FILL — Heparin Sod (Porcine)-NaCl IV Soln 1000 Unit/500ML-0.9%: INTRAVENOUS | Qty: 500 | Status: AC

## 2020-12-28 NOTE — Progress Notes (Signed)
  Echocardiogram 2D Echocardiogram limited bubble study has been performed.  Darlina Sicilian M 12/28/2020, 2:24 PM

## 2020-12-28 NOTE — Consult Note (Signed)
NEURO HOSPITALIST CONSULT NOTE   Requestig physician: Dr. Cyd Silence  Reason for Consult: Subacute ischemic infarctions on MRI  History obtained from:  Patient and Chart     HPI:                                                                                                                                          [name] is an 60 y.o. male who presented to the hospital with complaints of midsternal chest pain/achiness that was accompanied by dyspnea. He stated that it was similar to the feeling he had when he had an MI. He was diagnosed with NSTEMI. Cardiac cath revealed severe multivessel CAD and CABG was recommended. Also with hypertensive urgency, AKI vs CKD. He had speech difficulty after the catheterization, with trouble enunciating the end of his words and saying he just did not feel like himself. CT head showed strokes of indeterminate age. MRI was then obtained, revealing multifocal acute ischemic infarctions.   His stroke risk factors include CAD, CKD, DM2, hypercholesterolemia, HTN, obesity and tobacco abuse.   CT  head: Hypodensity in the right cerebellum, of indeterminate acuity but likely chronic.   MRI brain: Large acute infarct of the superior right cerebellum with mild surrounding edema and petechial hemorrhage. Multiple scattered punctate acute infarcts in multiple vascular territories, consistent with a central cardiac or aortic embolic source. Old left cerebellar infarct.  Past Medical History:  Diagnosis Date   CAD (coronary artery disease)    a. 03/2015 NSTEMI: LHC with severe 3V CAD  (70% mid RCA, 95% OM1, 90% distal LCx, 90% OM3, 80% prox LAD and 90% ost D1) s/p DES to mLAD w/ small dissction Rx with DES, staged ost Ramus PCI/DES and dLCx s/p PCI/DES    Diabetes mellitus type 2 in obese ([place])    Diverticulosis    Hypercholesteremia    Hypertension    Obesity    Tobacco abuse     Past Surgical History:  Procedure Laterality Date   CARDIAC  CATHETERIZATION N/A 03/19/2015   Procedure: Left Heart Cath and Coronary Angiography;  Surgeon: Lorretta Harp, MD;  Location: [facility];  Service: Cardiovascular;  Laterality: N/A;   CARDIAC CATHETERIZATION N/A 03/20/2015   Procedure: Coronary Stent Intervention;  Surgeon: Lorretta Harp, MD;  Location: [facility];  Service: Cardiovascular;  Laterality: N/A;   CARDIAC CATHETERIZATION N/A 03/22/2015   Procedure: Coronary Stent Intervention;  Surgeon: Lorretta Harp, MD;  Location: [facility];  Service: Cardiovascular;  Laterality: N/A;   LEFT HEART CATH AND CORONARY ANGIOGRAPHY N/A 12/25/2020   Procedure: LEFT HEART CATH AND CORONARY ANGIOGRAPHY;  Surgeon: Troy Sine, MD;  Location: [facility];  Service: Cardiovascular;  Laterality: N/A;   LEFT HEART  CATH AND CORONARY ANGIOGRAPHY N/A 12/26/2020   Procedure: LEFT HEART CATH AND CORONARY ANGIOGRAPHY;  Surgeon: Troy Sine, MD;  Location: [facility];  Service: Cardiovascular;  Laterality: N/A;    History reviewed. No pertinent family history.            Social History:  reports that he has been smoking cigarettes. He has been smoking an average of .5 packs per day. He has never used smokeless tobacco. He reports current alcohol use. He reports current drug use. Drug: Marijuana.  Allergies  Allergen Reactions   Aspirin Other (See Comments)    Upset stomach      MEDICATIONS:                                                                                                                     Scheduled:   stroke: mapping our early stages of recovery book   Does not apply Once   aspirin  81 mg Oral Daily   atorvastatin  80 mg Oral Daily   carvedilol  25 mg Oral BID WC   folic acid  1 mg Oral Daily   insulin aspart  0-15 Units Subcutaneous TID WC   insulin aspart  0-5 Units Subcutaneous QHS   insulin glargine-yfgn  15 Units Subcutaneous QHS   multivitamin with minerals  1 tablet Oral Daily    sodium chloride flush  3 mL Intravenous Q12H   sodium chloride flush  3 mL Intravenous Q12H   sodium chloride flush  3 mL Intravenous Q12H   thiamine  100 mg Oral Daily   Or   thiamine  100 mg Intravenous Daily   Continuous:  sodium chloride     sodium chloride     heparin 1,450 Units/hr (12/28/20 0356)   nitroGLYCERIN 60 mcg/min (12/28/20 0114)     ROS:                                                                                                                                       Denies any current incoordination, sensory loss or weakness. Other ROS as per HPI.    Blood pressure 138/87, pulse 80, temperature 98.2 F (36.8 C), temperature source Oral, resp. rate 20, height 5' 7.5" (1.715 m), weight 101.7 kg, SpO2 91 %.   General Examination:  Physical Exam  HEENT-  [place]/AT    Lungs- Respirations unlabored Extremities- Warm and well perfused  Neurological Examination Mental Status: Alert, oriented, thought content appropriate.  Speech fluent without evidence of aphasia.  Able to follow 3 step commands without difficulty. Cranial Nerves: II: Discs flat bilaterally; Visual fields grossly normal,  III,IV, VI: ptosis not present, extra-ocular motions intact bilaterally pupils equal, round, reactive to light and accommodation V,VII: smile symmetric, facial light touch sensation normal bilaterally VIII: hearing normal bilaterally IX,X: uvula rises symmetrically XI: bilateral shoulder shrug XII: midline tongue extension Motor: Right : Upper extremity   5/5    Left:     Upper extremity   5/5  Lower extremity   5/5     Lower extremity   5/5 Tone and bulk:normal tone throughout; no atrophy noted Sensory: Pinprick and light touch intact throughout, bilaterally Deep Tendon Reflexes: 2+ and symmetric throughout Plantars: Right: downgoing   Left: downgoing Cerebellar: normal  finger-to-nose, normal rapid alternating movements and normal heel-to-shin test Gait: normal gait and station   Lab Results: Basic Metabolic Panel: Recent Labs  Lab 12/24/20 0651 12/25/20 0031 12/25/20 1046 12/26/20 0232 12/27/20 0143  NA 132* 134* 136 133* 136  K 4.0 3.8 3.5 3.8 3.9  CL 96* 99 101 101 99  CO2 27 27 30 26 27   GLUCOSE 432* 279* 164* 248* 168*  BUN 17 20 16 14 11   CREATININE 1.56* 1.83* 1.77* 1.63* 1.52*  CALCIUM 9.3 9.3 8.8* 8.2* 8.6*    CBC: Recent Labs  Lab 12/24/20 0651 12/25/20 0031 12/26/20 0232 12/27/20 0143 12/28/20 0216  WBC 10.8* 12.7* 9.0 10.1 10.0  NEUTROABS 5.7  --   --   --   --   HGB 16.2 14.5 12.8* 13.3 12.8*  HCT 46.4 42.4 37.4* 40.2 37.9*  MCV 90.8 92.0 92.6 93.3 92.4  PLT 242 250 210 224 226    Cardiac Enzymes: No results for input(s): CKTOTAL, CKMB, CKMBINDEX, TROPONINI in the last 168 hours.  Lipid Panel: Recent Labs  Lab 12/25/20 0031  CHOL 260*  TRIG 235*  HDL 48  CHOLHDL 5.4  VLDL 47*  LDLCALC 165*    Imaging: CT HEAD WO CONTRAST (5MM)  Result Date: 12/27/2020 CLINICAL DATA:  Neuro deficit, acute, stroke suspected EXAM: CT HEAD WITHOUT CONTRAST TECHNIQUE: Contiguous axial images were obtained from the base of the skull through the vertex without intravenous contrast. COMPARISON:  None. FINDINGS: Brain: Hypodensity in the right cerebellum (series 3, image 9). No other hypodensity to suggest acute infarct. No acute hemorrhage. No mass, mass effect, or midline shift. No hydrocephalus or extra-axial collection. Vascular: No hyperdense vessel or unexpected calcification. Skull: Normal. Negative for fracture or focal lesion. Sinuses/Orbits: Mild mucosal thickening in the ethmoid air cells. Orbits unremarkable. Other: None. IMPRESSION: Hypodensity in the right cerebellum, of indeterminate acuity but likely chronic. Correlate with symptoms and consider MRI if clinically indicated. These results will be called to the ordering  clinician or representative by the Radiologist Assistant, and communication documented in the PACS or [employer]. Electronically Signed   By: Merilyn Baba M.D.   On: 12/27/2020 20:06   MR BRAIN WO CONTRAST  Result Date: 12/28/2020 CLINICAL DATA:  Acute neurologic deficit EXAM: MRI HEAD WITHOUT CONTRAST TECHNIQUE: Multiplanar, multiecho pulse sequences of the brain and surrounding structures were obtained without intravenous contrast. COMPARISON:  None. FINDINGS: Brain: There is a large acute or early subacute infarct of the superior right cerebellum. Additionally, there are multiple scattered punctate acute  infarcts scattered throughout both hemispheres in the anterior and posterior circulation. There is petechial hemorrhage in the area of the right cerebellar infarct with mild surrounding edema . there is an old left cerebellar infarct. No chronic microhemorrhage. Normal midline structures. Vascular: Major flow voids are preserved. Skull and upper cervical spine: Normal calvarium and skull base. Visualized upper cervical spine and soft tissues are normal. Sinuses/Orbits:No paranasal sinus fluid levels or advanced mucosal thickening. No mastoid or middle ear effusion. Normal orbits. IMPRESSION: 1. Large acute infarct of the superior right cerebellum with mild surrounding edema and petechial hemorrhage. 2. Multiple scattered punctate acute infarcts in multiple vascular territories, consistent with a central cardiac or aortic embolic source. 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 Electronically Signed   By: Ulyses Jarred M.D.   On: 12/28/2020 02:05   CARDIAC CATHETERIZATION  Result Date: 12/26/2020   Prox Cx lesion is 60% stenosed.   Prox Cx to Mid Cx lesion is 90% stenosed.   1st Mrg-2 lesion is 80% stenosed.   Mid LAD lesion is 50% stenosed.   Mid LAD to Dist LAD lesion is 80% stenosed.   2nd Diag lesion is 80% stenosed.   RPAV lesion is 50% stenosed.   RV Branch-1 lesion is 70% stenosed.   RV Branch-2  lesion is 80% stenosed.   Prox RCA-1 lesion is 90% stenosed.   Prox RCA to Mid RCA lesion is 95% stenosed.   Mid RCA lesion is 20% stenosed.   Prox RCA-2 lesion is 65% stenosed.   Dist RCA lesion is 20% stenosed.   1st Diag lesion is 90% stenosed.   Prox LAD lesion is 30% stenosed.   Previously placed Mid Cx to Dist Cx stent (unknown type) is  widely patent.   Previously placed 1st Mrg-1 stent (unknown type) is  widely patent.   LV end diastolic pressure is severely elevated. 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  In this diabetic male with significant diffuse disease in the LAD, left circumflex, and right coronary artery recommend surgical evaluation for consideration of CABG revascularization. RECOMMENDATION: Surgical consultation for consideration of CABG.  We will reinitiate heparin later this evening.  Patient continues to be on intravenous nitroglycerin.  Increase medical therapy as blood pressure and heart rate allow.  Aggressive lipid-lowering therapy with target LDL in the 50s or below.    TTE 12/24/20: 1. Left ventricular ejection fraction, by estimation, is 55 to 60%. The left ventricle has normal function. The left ventricle demonstrates regional wall motion abnormalities (see scoring diagram/findings for description). Left ventricular diastolic  parameters are consistent with Grade I diastolic dysfunction (impaired relaxation).   2. Right ventricular systolic function is normal. The right ventricular size is normal.   3. The mitral valve is normal in structure. Mild mitral valve  regurgitation. No evidence of mitral stenosis.   4. The aortic valve is normal in structure. Aortic valve regurgitation is not visualized. Aortic valve sclerosis is present, with no evidence of aortic valve stenosis.   5. The inferior vena cava is normal in size with greater than 50% respiratory  variability, suggesting right atrial pressure of 3 mmHg.  Assessment:  60 y.o. male who presented to the hospital on 11/14 with complaints of midsternal chest pain/achiness that was accompanied by dyspnea. He stated that it was similar to the feeling he had when he had an MI. He was diagnosed with NSTEMI. Cardiac cath  revealed severe multivessel CAD and CABG was recommended. Also with hypertensive urgency, AKI vs CKD. He had speech difficulty after the catheterization, with trouble enunciating the end of his words and saying he just did not feel like himself. CT head showed strokes of indeterminate age. MRI was then obtained, revealing multifocal acute ischemic infarctions.  1. Exam reveals no aphasia, focal weakness or ataxia.  2. MRI brain: Large acute infarct of the superior right cerebellum with mild surrounding edema and petechial hemorrhage. Multiple scattered punctate acute infarcts in multiple vascular territories, consistent with a central cardiac or aortic embolic source. Old left cerebellar infarct. 3. Stroke risk factors: DM2 (HgbA1c elevated at 9.9), CAD, CKD, hypercholesterolemia, HTN, obesity and tobacco abuse.  4. Strokes most likely secondary to iatrogenic distal embolization during cardiac catheterization procedure. Symptoms first noticed after the procedure.   Recommendations: 1. CTA of head and neck 2. PT consult, OT consult, Speech consult 3. Continue ASA and atorvastatin.  4. Smoking cessation 5. Telemetry monitoring 6. Frequent neuro checks   Electronically signed: Dr. Kerney Elbe 12/28/2020, 3:41 AM

## 2020-12-28 NOTE — Progress Notes (Addendum)
STROKE TEAM PROGRESS NOTE   ATTENDING NOTE: I reviewed above note and agree with the assessment and plan. Pt was seen and examined.   60 year old male with history of CAD, diabetes, hypertension, hyperlipidemia, smoker, obesity admitted for chest pain and shortness of breath.  Diagnosed with a non-STEMI.  Had 2 cardiac caths done and diagnosed with multifocal CAD and recommended CABG.  However, after second cardiac cath, patient complained of slurred speech.  CT showed right cerebellum small infarct.  MRI showed right SCA infarcts with petechial hemorrhage, and bilateral hemisphere punctate infarcts.  CTA head and neck showed multifocal intracranial severe stenosis, including severe basilar artery stenosis, right SCA origin narrowing, severe stenosis left V4 with focal occlusion and distal retrograde flow, moderate stenosis right V4, severe bilateral ICA siphon stenosis.  EF 55 to 60%.  A1c 9.9, LDL 165, creatinine 1.52.  On exam, no family at bedside.  Patient lying in bed, awake alert, orientated x3, no aphasia, mild dysarthria, follows some commands and able to name and repeat.  Visual fields full, no gaze palsy, facial symmetrical.  Lateral upper and lower extremity equal strengths and sensation, finger-to-nose bilaterally intact.  Etiology for patient stroke could be due to cardiac cath procedure related given temporal relationship.  However, patient does have non-STEMI and severe intracranial stenosis, therefore in the setting of low BP such stroke could have been.  Occult A. fib is also in the differential but no A. fib so far seen during telemetry monitoring.  Recommend 30-day cardiac event monitoring.  Recommend aspirin 325 and Plavix 75 DAPT for 3 months and then aspirin alone given multifocal intracranial vascular stenosis.  He is off heparin IV for now.  Continue Lipitor 80, medication compliance education provided.  Aggressive risk factor modification including better DM control.  Quit smoking  and limit alcohol use.  PT/OT pending.  For detailed assessment and plan, please refer to above as I have made changes wherever appropriate.   Neurology will sign off. Please call with questions. Pt will follow up with stroke clinic NP at [facility] in about 4 weeks. Thanks for the consult.  Rosalin Hawking, MD PhD Stroke Neurology 12/28/2020 5:15 PM     INTERVAL HISTORY No one is at the bedside at time of this exam.  Patient had uneventful evening. MRI brain revealed multifocal acute ischemic infarctions. He is pleasant and cooperative. Recommend 30 day outpatient monitor.   Vitals:   12/27/20 2356 12/28/20 0525 12/28/20 0720 12/28/20 1141  BP: 138/87 131/81 (!) 154/82 138/88  Pulse: 80 84 76 85  Resp: 20 20 20 18   Temp: 98.2 F (36.8 C) 98.4 F (36.9 C) 98.1 F (36.7 C) 98 F (36.7 C)  TempSrc: Oral Oral Oral Oral  SpO2: 91% 94% 94% 95%  Weight:  101.4 kg    Height:       CBC:  Recent Labs  Lab 12/24/20 0651 12/25/20 0031 12/27/20 0143 12/28/20 0216  WBC 10.8*   < > 10.1 10.0  NEUTROABS 5.7  --   --   --   HGB 16.2   < > 13.3 12.8*  HCT 46.4   < > 40.2 37.9*  MCV 90.8   < > 93.3 92.4  PLT 242   < > 224 226   < > = values in this interval not displayed.   Basic Metabolic Panel:  Recent Labs  Lab 12/26/20 0232 12/27/20 0143  NA 133* 136  K 3.8 3.9  CL 101 99  CO2 26 27  GLUCOSE 248* 168*  BUN 14 11  CREATININE 1.63* 1.52*  CALCIUM 8.2* 8.6*    Lipid Panel:  Recent Labs  Lab 12/25/20 0031  CHOL 260*  TRIG 235*  HDL 48  CHOLHDL 5.4  VLDL 47*  LDLCALC 165*    HgbA1c:  Recent Labs  Lab 12/25/20 2105  HGBA1C 9.9*   Urine Drug Screen:  Recent Labs  Lab 12/24/20 1227  LABOPIA NONE DETECTED  COCAINSCRNUR NONE DETECTED  LABBENZ NONE DETECTED  AMPHETMU NONE DETECTED  THCU NONE DETECTED  LABBARB NONE DETECTED    Alcohol Level No results for input(s): ETH in the last 168 hours.  IMAGING past 24 hours CT HEAD WO CONTRAST (5MM)  Result Date:  12/27/2020 CLINICAL DATA:  Neuro deficit, acute, stroke suspected EXAM: CT HEAD WITHOUT CONTRAST TECHNIQUE: Contiguous axial images were obtained from the base of the skull through the vertex without intravenous contrast. COMPARISON:  None. FINDINGS: Brain: Hypodensity in the right cerebellum (series 3, image 9). No other hypodensity to suggest acute infarct. No acute hemorrhage. No mass, mass effect, or midline shift. No hydrocephalus or extra-axial collection. Vascular: No hyperdense vessel or unexpected calcification. Skull: Normal. Negative for fracture or focal lesion. Sinuses/Orbits: Mild mucosal thickening in the ethmoid air cells. Orbits unremarkable. Other: None. IMPRESSION: Hypodensity in the right cerebellum, of indeterminate acuity but likely chronic. Correlate with symptoms and consider MRI if clinically indicated. These results will be called to the ordering clinician or representative by the Radiologist Assistant, and communication documented in the PACS or [employer]. Electronically Signed   By: Merilyn Baba M.D.   On: 12/27/2020 20:06   MR BRAIN WO CONTRAST  Result Date: 12/28/2020 CLINICAL DATA:  Acute neurologic deficit EXAM: MRI HEAD WITHOUT CONTRAST TECHNIQUE: Multiplanar, multiecho pulse sequences of the brain and surrounding structures were obtained without intravenous contrast. COMPARISON:  None. FINDINGS: Brain: There is a large acute or early subacute infarct of the superior right cerebellum. Additionally, there are multiple scattered punctate acute infarcts scattered throughout both hemispheres in the anterior and posterior circulation. There is petechial hemorrhage in the area of the right cerebellar infarct with mild surrounding edema . there is an old left cerebellar infarct. No chronic microhemorrhage. Normal midline structures. Vascular: Major flow voids are preserved. Skull and upper cervical spine: Normal calvarium and skull base. Visualized upper cervical spine and  soft tissues are normal. Sinuses/Orbits:No paranasal sinus fluid levels or advanced mucosal thickening. No mastoid or middle ear effusion. Normal orbits. IMPRESSION: 1. Large acute infarct of the superior right cerebellum with mild surrounding edema and petechial hemorrhage. 2. Multiple scattered punctate acute infarcts in multiple vascular territories, consistent with a central cardiac or aortic embolic source. 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 Electronically Signed   By: Ulyses Jarred M.D.   On: 12/28/2020 02:05    PHYSICAL EXAM.  Mental Status: Alert, oriented, thought content appropriate.  Speech fluent without evidence of aphasia.  Able to follow 3 step commands without difficulty. Cranial Nerves: II: Discs flat bilaterally; Visual fields grossly normal,  III,IV, VI: ptosis not present, extra-ocular motions intact bilaterally pupils equal, round, reactive to light and accommodation V,VII: smile symmetric, facial light touch sensation normal bilaterally VIII: hearing normal bilaterally IX,X: uvula rises symmetrically XI: bilateral shoulder shrug XII: midline tongue extension Motor: Right :  Upper extremity   5/5  Left:     Upper extremity   5/5             Lower extremity   5/5                                                  Lower extremity   5/5 Tone and bulk:normal tone throughout; no atrophy noted Sensory: Pinprick and light touch intact throughout, bilaterally Deep Tendon Reflexes: 2+ and symmetric throughout Plantars: Right: downgoing                                Left: downgoing Cerebellar: normal finger-to-nose, normal rapid alternating movements and normal heel-to-shin test Gait: normal gait and station  ASSESSMENT/PLAN Mr. Asael Pann is a 60 y.o. male with history of  CAD, type II DM, hypercholesteremia, hypertension and tobacco abuse who presented to the hospital with complaints of midsternal chest pain/achiness that was accompanied by  dyspnea. He stated that it was similar to the feeling he had when he had an MI. He was diagnosed with NSTEMI. Cardiac cath revealed severe multivessel CAD and CABG was recommended. Also with hypertensive urgency, AKI vs CKD. He had speech difficulty after the catheterization, with trouble enunciating the end of his words and saying he just did not feel like himself. CT head showed strokes of indeterminate age. MRI was then obtained, revealing multifocal acute ischemic infarctions.   Stroke:  bilateral acute infarct, embolic pattern, could be related to cardiac cardiac procedures as symptoms first noticed after the procedure. DDx including related to NSTEMI or occult afib CT HEAD:  Hypodensity in the right cerebellum, of indeterminate acuity but likely chronic. Correlate with symptoms and consider MRI if clinically indicated CTA head & neck done and results are pending    MRI BRAIN:  1. Large acute infarct of the superior right cerebellum with mild surrounding edema and petechial hemorrhage. 2. Multiple scattered punctate acute infarcts in multiple vascular territories, consistent with a central cardiac or aortic embolic source. 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   2D Echo  12/24/20  1. Left ventricular ejection fraction, by estimation, is 55 to 60%. The  left ventricle has normal function. The left ventricle demonstrates  regional wall motion abnormalities (see scoring diagram/findings for  description). Left ventricular diastolic  parameters are consistent with Grade I diastolic dysfunction (impaired  relaxation).   2. Right ventricular systolic function is normal. The right ventricular  size is normal.   3. The mitral valve is normal in structure. Mild mitral valve  regurgitation. No evidence of mitral stenosis.   4. The aortic valve is normal in structure. Aortic valve regurgitation is  not visualized. Aortic valve sclerosis is present, with no evidence of  aortic valve stenosis.   5.  The inferior vena cava is normal in size with greater than 50%  respiratory variability, suggesting right atrial pressure of 3 mmHg.     LDL 165 HgbA1c 9.9 VTE prophylaxis - scd     Diet   Diet heart healthy/carb modified Room service appropriate? Yes; Fluid consistency: Thin   aspirin 81 mg daily prior to admission, now on aspirin 81 mg daily. Agree with DAPT per cardiology and DAPT regimen per cardiology.  Therapy recommendations:  pending Disposition:  pending   Hypertension Home meds:  lisinopril  Stable Permissive hypertension (OK if < 220/120) but gradually normalize in 5-7 days Long-term BP goal normotensive  Hyperlipidemia Home meds:  not taking meds LDL 165, goal < 70 Now on lipitor 80 High intensity statin   Continue statin at discharge  Diabetes type II Uncontrolled Home meds:  not taking insulin as prescribed HgbA1c 9.9, goal < 7.0 CBGs SSI  Tobacco abuse Current smoker Smoking cessation counseling provided Pt is willing to quit  Other Stroke Risk Factors  Obesity, Body mass index is 34.49 kg/m., BMI >/= 30 associated with increased stroke risk, recommend weight loss, diet and exercise as appropriate   Coronary artery disease  NSTEMI  Other Active Problems    Hospital day # 4     To contact Stroke Continuity provider, please refer to [email or web address] After hours, contact General Neurology

## 2020-12-28 NOTE — Progress Notes (Signed)
OT Cancellation Note  Patient Details Name: [name] MRN: [id number] DOB: September 19, 1960   Cancelled Treatment:    Reason Eval/Treat Not Completed: Patient at procedure or test/ unavailable (Pt unavailable this morning and now in a test. Will continue to follow.)  Malka So 12/28/2020, 1:40 PM Nestor Lewandowsky, OTR/L Acute Rehabilitation Services Pager: [phone] Office: [phone]

## 2020-12-28 NOTE — Progress Notes (Signed)
*  PRELIMINARY RESULTS* Echocardiogram 2D Echocardiogram has been attempted for a second time. Patient is going to vascular for a study after he has a consult and patient care. I will attempt again when patient is in vascular lab.  Bobbye [place] 12/28/2020, 1:12 PM

## 2020-12-28 NOTE — TOC CAGE-AID Note (Signed)
Transition of Care [facility]) - CAGE-AID Screening   Patient Details  Name: [name] MRN: [id number] Date of Birth: 07-23-1960  Transition of Care [facility]) CM/SW Contact:    Walther Sanagustin C Tarpley-Carter, [place] Phone Number: 12/28/2020, 9:16 AM   Clinical Narrative: Pt participated in [place].  Pt stated he does not use substance or ETOH.  Pt smokes cigarettes and drinks ETOH socially.  Pt was offered resources, due to his usage of substance.   CSW will provide resources for possible future use.  Hicks Feick Tarpley-Carter, MSW, LCSW-A Pronouns:  She/Her/Hers [facility] HealthTransitions of Care Clinical Social Worker Direct Number:  [phone] [email or web address]    CAGE-AID Screening:    Have You Ever Felt You Ought to Cut Down on Your Drinking or Drug Use?: No Have People Annoyed You By [employer] Your Drinking Or Drug Use?: No Have You Felt Bad Or Guilty About Your Drinking Or Drug Use?: No Have You Ever Had a Drink or Used Drugs First Thing In The Morning to Steady Your Nerves or to Get Rid of a Hangover?: No CAGE-AID Score: 0  Substance Abuse Education Offered: Yes (Pt smokes cigarettes and drinks ETOH socially.)  Substance abuse interventions: Scientist, clinical (histocompatibility and immunogenetics)

## 2020-12-28 NOTE — Plan of Care (Signed)

## 2020-12-28 NOTE — Progress Notes (Addendum)
  Echocardiogram 2D Echocardiogram has been attempted. RN request we come back later.  [name] 12/28/2020, 10:45 AM

## 2020-12-28 NOTE — Progress Notes (Addendum)
ANTICOAGULATION CONSULT NOTE - Follow Up Consult  Pharmacy Consult for heparin Indication:  NSTEMI  Labs: Recent Labs    12/25/20 1046 12/26/20 0232 12/26/20 0232 12/27/20 0143 12/27/20 0812 12/28/20 0216  HGB  --  12.8*   < > 13.3  --  12.8*  HCT  --  37.4*  --  40.2  --  37.9*  PLT  --  210  --  224  --  226  HEPARINUNFRC 0.38  --   --   --  0.42 0.70  CREATININE 1.77* 1.63*  --  1.52*  --   --    < > = values in this interval not displayed.     Assessment: 60yo male s/p repeat cath done 11/16. Cath with significant multivessel CAD; recommended for CABG.  Heparin level came back therapeutic at 0.7, on 1450 units/hr. Hgb 12.8, plt 226. No s/sx of bleeding or infusion issues. Heparin was stopped for around a half hour while undergoing MRI - was after level had resulted.   Goal of Therapy:  Heparin level 0.3-0.7 units/ml   Plan:  Reduce heparin infusion at 1350 units / hr Order heparin level in 6 hours  Daily heparin level, CBC  Thank you,  Antonietta Jewel, PharmD, BCCCP Clinical Pharmacist  Phone: [phone] 12/28/2020 8:15 AM  Please check AMION for all [place] phone numbers After 10:00 PM, call [place] [phone]  ADDENDUM Talked with neurology, cardiology, and hospitalist- given findings of stroke on MRI and since >48 hr since NSTEMI, will discontinue heparin infusion at this time. Orders removed.  Antonietta Jewel, PharmD, [place] Clinical Pharmacist  Phone: [phone] 12/28/2020 9:34 AM  Please check AMION for all [place] phone numbers After 10:00 PM, call [place] [phone]

## 2020-12-28 NOTE — Progress Notes (Signed)
PT Cancellation Note  Patient Details Name: [name] MRN: [id number] DOB: [birth year]   Cancelled Treatment:    Reason Eval/Treat Not Completed: (P) Other (comment) RN request follow back this afternoon as pt did not get a lot of sleep last night and is anxious about what is going on. PT will follow back this afternoon for treatment.   [place] Frieden B. Migdalia Dk PT, DPT Acute Rehabilitation Services Pager [phone] Office [phone]    [place] 12/28/2020, 9:40 AM

## 2020-12-28 NOTE — Progress Notes (Signed)
Pre cabg has been completed.   Preliminary results in CV Proc.   Jinny Blossom [place] 12/28/2020 2:14 PM

## 2020-12-28 NOTE — Progress Notes (Signed)
IVT consult note:  At  bedside for IV access for CT; patient stated wants to speak with MD first. Rn at bedside talking to pt. Will re visit when patient is ready.

## 2020-12-28 NOTE — Progress Notes (Signed)
PT Cancellation Note  Patient Details Name: [name] MRN: [id number] DOB: 04/24/1960   Cancelled Treatment:    Reason Eval/Treat Not Completed: (P) Patient at procedure or test/unavailable Pt off the floor for procedure. PT will follow back tomorrow for Evaluation.  Trajon Rosete B. Migdalia Dk PT, DPT Acute Rehabilitation Services Pager [phone] Office [phone]    [place] 12/28/2020, 2:15 PM

## 2020-12-28 NOTE — Progress Notes (Signed)
  Echocardiogram 2D Echocardiogram has been performed.  Darlina Sicilian M 12/28/2020, 2:21 PM

## 2020-12-28 NOTE — Evaluation (Signed)
Speech Language Pathology Evaluation Patient Details Name: [name] MRN: [id number] DOB: 1961/01/19 Today's Date: 12/28/2020 Time: 9628-3662 SLP Time Calculation (min) (ACUTE ONLY): 31 min  Problem List:  Patient Active Problem List   Diagnosis Date Noted   Cerebral embolism with cerebral infarction 12/28/2020   AKI (acute kidney injury) ([place])    Chest pain 12/24/2020   CAD (coronary artery disease)    Diabetes mellitus type 2 in obese ([place])    Hypercholesteremia    Tobacco abuse    Obesity    NSTEMI (non-ST elevated myocardial infarction) ([place]) 03/17/2015   Essential hypertension 03/16/2015   Hyperlipidemia 03/16/2015   Dyspnea on exertion 03/16/2015   Diverticulosis    Past Medical History:  Past Medical History:  Diagnosis Date   CAD (coronary artery disease)    a. 03/2015 NSTEMI: LHC with severe 3V CAD  (70% mid RCA, 95% OM1, 90% distal LCx, 90% OM3, 80% prox LAD and 90% ost D1) s/p DES to mLAD w/ small dissction Rx with DES, staged ost Ramus PCI/DES and dLCx s/p PCI/DES    Diabetes mellitus type 2 in obese ([place])    Diverticulosis    Hypercholesteremia    Hypertension    Obesity    Tobacco abuse    Past Surgical History:  Past Surgical History:  Procedure Laterality Date   CARDIAC CATHETERIZATION N/A 03/19/2015   Procedure: Left Heart Cath and Coronary Angiography;  Surgeon: Lorretta Harp, MD;  Location: [place];  Service: Cardiovascular;  Laterality: N/A;   CARDIAC CATHETERIZATION N/A 03/20/2015   Procedure: Coronary Stent Intervention;  Surgeon: Lorretta Harp, MD;  Location: [facility];  Service: Cardiovascular;  Laterality: N/A;   CARDIAC CATHETERIZATION N/A 03/22/2015   Procedure: Coronary Stent Intervention;  Surgeon: Lorretta Harp, MD;  Location: [facility];  Service: Cardiovascular;  Laterality: N/A;   LEFT HEART CATH AND CORONARY ANGIOGRAPHY N/A 12/25/2020   Procedure: LEFT HEART CATH AND CORONARY ANGIOGRAPHY;  Surgeon: Troy Sine, MD;  Location: [facility];  Service: Cardiovascular;  Laterality: N/A;   LEFT HEART CATH AND CORONARY ANGIOGRAPHY N/A 12/26/2020   Procedure: LEFT HEART CATH AND CORONARY ANGIOGRAPHY;  Surgeon: Troy Sine, MD;  Location: [place];  Service: Cardiovascular;  Laterality: N/A;   HPI:  Pt is a 60 yo male presenting 11/14 with NSTEMI with CABG recommended. Speech changes were noted post-cath and MRI revealed multifocal acute ischemic infarctions including a large acute infarct in tyhe superior R cerebellum. PMH includes: CAD, DM, HTN   Assessment / Plan / Recommendation Clinical Impression  Pt appears to be at his cognitive-linguistic baseline, demonstrating adequate recall, awareness, and problem solving within verbal tasks. His expressive language is fluent and he demonstrates good receptive comprehension. Pt's acute impairments appear to be primarily related to his articulation of speech at the conversational level and with multisyllabic words. He is already starting to use some strategies including over articulation and slowed rate PRN, with education and reinforcement provided. Additional opportunities to facilitate speech were also discussed. Pt appears to be functional for his acute care stay but would consider f/u SLP at next level of care for higher level speech deficits.    SLP Assessment  SLP Recommendation/Assessment: All further Speech Lanaguage Pathology  needs can be addressed in the next venue of care SLP Visit Diagnosis: Dysarthria and anarthria (R47.1)    Recommendations for follow up therapy are one component of a multi-disciplinary discharge planning  process, led by the attending physician.  Recommendations may be updated based on patient status, additional functional criteria and insurance authorization.    Follow Up Recommendations  Outpatient SLP    Assistance Recommended at Discharge  Intermittent Supervision/Assistance  Functional Status  Assessment Patient has had a recent decline in their functional status and demonstrates the ability to make significant improvements in function in a reasonable and predictable amount of time.  Frequency and Duration           SLP Evaluation Cognition  Overall Cognitive Status: Within Functional Limits for tasks assessed       Comprehension  Auditory Comprehension Overall Auditory Comprehension: Appears within functional limits for tasks assessed    Expression Expression Primary Mode of Expression: Verbal Verbal Expression Overall Verbal Expression: Appears within functional limits for tasks assessed   Oral / Motor  Motor Speech Overall Motor Speech: Impaired Respiration: Within functional limits Phonation: Normal Resonance: Within functional limits Articulation: Impaired Level of Impairment: Conversation Intelligibility: Intelligible Effective Techniques: Over-articulate   GO                    Osie Bond., M.A. [place] Acute Rehabilitation Services Pager [phone] Office [phone]  12/28/2020, 3:54 PM

## 2020-12-28 NOTE — Progress Notes (Signed)
Cardiac monitor placed for CVA, Dr. Johney Frame to read. Outpatient follow up appt arranged.

## 2020-12-28 NOTE — Progress Notes (Signed)
Progress Note    [name]  KZL:[id number] DOB: [birth year]  DOA: 12/24/2020 PCP: Patient, No Pcp Per (Inactive)    Brief Narrative:     Medical records reviewed and are as summarized below:  [name] is an 60 y.o. male who presented with complaints of midsternal chest pain/achiness that was accompanied by dyspnea. He stated that it was similar to the feeling he had when he had an MI.  S/P cath and CABG was recommended but patient found to have CVAs.     Assessment/Plan:   Active Problems:   Chest pain   AKI (acute kidney injury) ([place])   Cerebral embolism with cerebral infarction   CVA -only complaint is speech difficulty -MRI: Large acute infarct of the superior right cerebellum with mild surrounding edema and petechial hemorrhage. 2. Multiple scattered punctate acute infarcts in multiple vascular territories, consistent with a central cardiac or aortic embolic source. 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 -neurology consult appreciated -PT/OT -statin, ASA  NSTEMI     - Cath showed 90% prox-mid RCA, 70-80% RV marginal branches and mild distal RCA disease. Unable to engage left system on initial cath and therefore repeat cath performed 11/16 which showed severe multivessel CAD and was recommended for CABG but plans on hold due to CVA   HTN urgency     - coreg     - per cards   AKI? Vs CKD     - renal US unremarkable     - hold lisinopril; watch nephrotoxins     - CR trending down   DM2     -A1C is 9/9-- improved from 5 years ago -goal of <7   HLD/CAD     - ASA, statin, BB   Tobacco abuse/EtOH abuse/Marijuana abuse     - counseled against further use     - no sign of withdrawal  obesity Body mass index is 34.49 kg/m.   Family Communication/Anticipated D/C date and plan/Code Status    Code Status: Full Code.  Disposition Plan: Status is: Inpatient  Remains inpatient appropriate because: needs CVA work up         Medical Consultants:    [facility] Neu[facility]      Subjective:   Still does not feel like himself  Objective:    Vitals:   12/27/20 2356 12/28/20 0525 12/28/20 0720 12/28/20 1141  BP: 138/87 131/81 (!) 154/82 138/88  Pulse: 80 84 76 85  Resp: 20 20 20 18   Temp: 98.2 F (36.8 C) 98.4 F (36.9 C) 98.1 F (36.7 C) 98 F (36.7 C)  TempSrc: Oral Oral Oral Oral  SpO2: 91% 94% 94% 95%  Weight:  101.4 kg    Height:        Intake/Output Summary (Last 24 hours) at 12/28/2020 1437 Last data filed at 12/28/2020 1300 Gross per 24 hour  Intake 160 ml  Output 150 ml  Net 10 ml   Filed Weights   12/26/20 0132 12/27/20 0550 12/28/20 0525  Weight: 103.7 kg 101.7 kg 101.4 kg    Exam:  General: Appearance:    Obese male in no acute distress     Lungs:     respirations unlabored  Heart:    Normal heart rate.   MS:   All extremities are intact.    Neurologic:   Awake, alert, oriented x 3          Data Reviewed:   I have personally reviewed following labs and imaging studies:  Labs:  Labs show the following:   Basic Metabolic Panel: Recent Labs  Lab 12/24/20 0651 12/25/20 0031 12/25/20 1046 12/26/20 0232 12/27/20 0143  NA 132* 134* 136 133* 136  K 4.0 3.8 3.5 3.8 3.9  CL 96* 99 101 101 99  CO2 27 27 30 26 27   GLUCOSE 432* 279* 164* 248* 168*  BUN 17 20 16 14 11   CREATININE 1.56* 1.83* 1.77* 1.63* 1.52*  CALCIUM 9.3 9.3 8.8* 8.2* 8.6*   GFR Estimated Creatinine Clearance: 59.1 mL/min (A) (by C-G formula based on SCr of 1.52 mg/dL (H)). Liver Function Tests: Recent Labs  Lab 12/24/20 0651 12/25/20 0031  AST 32 22  ALT 42 32  ALKPHOS 131* 95  BILITOT 0.5 0.8  PROT 7.4 6.3*  ALBUMIN 3.9 3.2*   No results for input(s): LIPASE, AMYLASE in the last 168 hours. No results for input(s): AMMONIA in the last 168 hours. Coagulation profile No results for input(s): INR, PROTIME in the last 168 hours.  CBC: Recent Labs  Lab 12/24/20 0651 12/25/20 0031 12/26/20 0232 12/27/20 0143  12/28/20 0216  WBC 10.8* 12.7* 9.0 10.1 10.0  NEUTROABS 5.7  --   --   --   --   HGB 16.2 14.5 12.8* 13.3 12.8*  HCT 46.4 42.4 37.4* 40.2 37.9*  MCV 90.8 92.0 92.6 93.3 92.4  PLT 242 250 210 224 226   Cardiac Enzymes: No results for input(s): CKTOTAL, CKMB, CKMBINDEX, TROPONINI in the last 168 hours. BNP (last 3 results) No results for input(s): PROBNP in the last 8760 hours. CBG: Recent Labs  Lab 12/27/20 1633 12/27/20 2050 12/27/20 2052 12/28/20 0615 12/28/20 1142  GLUCAP 149* 174* 194* 249* 183*   D-Dimer: No results for input(s): DDIMER in the last 72 hours. Hgb A1c: Recent Labs    12/25/20 2105  HGBA1C 9.9*   Lipid Profile: No results for input(s): CHOL, HDL, LDLCALC, TRIG, CHOLHDL, LDLDIRECT in the last 72 hours.  Thyroid function studies: No results for input(s): TSH, T4TOTAL, T3FREE, THYROIDAB in the last 72 hours.  Invalid input(s): FREET3 Anemia work up: No results for input(s): VITAMINB12, FOLATE, FERRITIN, TIBC, IRON, RETICCTPCT in the last 72 hours. Sepsis Labs: Recent Labs  Lab 12/25/20 0031 12/26/20 0232 12/27/20 0143 12/28/20 0216  WBC 12.7* 9.0 10.1 10.0    Microbiology Recent Results (from the past 240 hour(s))  Resp Panel by RT-PCR (Flu A&B, Covid) Nasopharyngeal Swab     Status: None   Collection Time: 12/24/20 12:57 PM   Specimen: Nasopharyngeal Swab; Nasopharyngeal(NP) swabs in vial transport medium  Result Value Ref Range Status   SARS Coronavirus 2 by RT PCR NEGATIVE NEGATIVE Final    Comment: (NOTE) SARS-CoV-2 target nucleic acids are NOT DETECTED.  The SARS-CoV-2 RNA is generally detectable in upper respiratory specimens during the acute phase of infection. The lowest concentration of SARS-CoV-2 viral copies this assay can detect is 138 copies/mL. A negative result does not preclude SARS-Cov-2 infection and should not be used as the sole basis for treatment or other patient management decisions. A negative result may occur  with  improper specimen collection/handling, submission of specimen other than nasopharyngeal swab, presence of viral mutation(s) within the areas targeted by this assay, and inadequate number of viral copies(<138 copies/mL). A negative result must be combined with clinical observations, patient history, and epidemiological information. The expected result is Negative.  Fact Sheet for Patients:  EntrepreneurPulse.com.au  Fact Sheet for Healthcare Providers:  IncredibleEmployment.be  This test is no t yet approved or  cleared by the [place] and  has been authorized for detection and/or diagnosis of SARS-CoV-2 by FDA under an Emergency Use Authorization (EUA). This EUA will remain  in effect (meaning this test can be used) for the duration of the COVID-19 declaration under Section 564(b)(1) of the Act, 21 U.S.C.section 360bbb-3(b)(1), unless the authorization is terminated  or revoked sooner.       Influenza A by PCR NEGATIVE NEGATIVE Final   Influenza B by PCR NEGATIVE NEGATIVE Final    Comment: (NOTE) The Xpert Xpress SARS-CoV-2/FLU/RSV plus assay is intended as an aid in the diagnosis of influenza from Nasopharyngeal swab specimens and should not be used as a sole basis for treatment. Nasal washings and aspirates are unacceptable for Xpert Xpress SARS-CoV-2/FLU/RSV testing.  Fact Sheet for Patients: EntrepreneurPulse.com.au  Fact Sheet for Healthcare Providers: IncredibleEmployment.be  This test is not yet approved or cleared by the [place] FDA and has been authorized for detection and/or diagnosis of SARS-CoV-2 by FDA under an Emergency Use Authorization (EUA). This EUA will remain in effect (meaning this test can be used) for the duration of the COVID-19 declaration under Section 564(b)(1) of the Act, 21 U.S.C. section 360bbb-3(b)(1), unless the authorization is terminated  or revoked.  Performed at [facility], [place] [address], [place], [place] [address]     Procedures and diagnostic studies:  CT HEAD WO CONTRAST (5MM)  Result Date: 12/27/2020 CLINICAL DATA:  Neuro deficit, acute, stroke suspected EXAM: CT HEAD WITHOUT CONTRAST TECHNIQUE: Contiguous axial images were obtained from the base of the skull through the vertex without intravenous contrast. COMPARISON:  None. FINDINGS: Brain: Hypodensity in the right cerebellum (series 3, image 9). No other hypodensity to suggest acute infarct. No acute hemorrhage. No mass, mass effect, or midline shift. No hydrocephalus or extra-axial collection. Vascular: No hyperdense vessel or unexpected calcification. Skull: Normal. Negative for fracture or focal lesion. Sinuses/Orbits: Mild mucosal thickening in the ethmoid air cells. Orbits unremarkable. Other: None. IMPRESSION: Hypodensity in the right cerebellum, of indeterminate acuity but likely chronic. Correlate with symptoms and consider MRI if clinically indicated. These results will be called to the ordering clinician or representative by the Radiologist Assistant, and communication documented in the PACS or [employer]. Electronically Signed   By: Merilyn Baba M.D.   On: 12/27/2020 20:06   MR BRAIN WO CONTRAST  Result Date: 12/28/2020 CLINICAL DATA:  Acute neurologic deficit EXAM: MRI HEAD WITHOUT CONTRAST TECHNIQUE: Multiplanar, multiecho pulse sequences of the brain and surrounding structures were obtained without intravenous contrast. COMPARISON:  None. FINDINGS: Brain: There is a large acute or early subacute infarct of the superior right cerebellum. Additionally, there are multiple scattered punctate acute infarcts scattered throughout both hemispheres in the anterior and posterior circulation. There is petechial hemorrhage in the area of the right cerebellar infarct with mild surrounding edema . there is an old left cerebellar infarct. No  chronic microhemorrhage. Normal midline structures. Vascular: Major flow voids are preserved. Skull and upper cervical spine: Normal calvarium and skull base. Visualized upper cervical spine and soft tissues are normal. Sinuses/Orbits:No paranasal sinus fluid levels or advanced mucosal thickening. No mastoid or middle ear effusion. Normal orbits. IMPRESSION: 1. Large acute infarct of the superior right cerebellum with mild surrounding edema and petechial hemorrhage. 2. Multiple scattered punctate acute infarcts in multiple vascular territories, consistent with a central cardiac or aortic embolic source. 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 Electronically Signed   By: Ulyses Jarred M.D.   On:  12/28/2020 02:05   VAS US DOPPLER PRE CABG  Result Date: 12/28/2020 PREOPERATIVE VASCULAR EVALUATION Patient Name:  KRISTIAN MOGG  Date of Exam:   12/28/2020 Medical Rec #: [id number]   Accession #:    [id number] Date of Birth: 1960/12/17   Patient Gender: M Patient Age:   12 years Exam Location:  [facility] Procedure:      VAS US DOPPLER PRE CABG Referring Phys: Gilford Raid --------------------------------------------------------------------------------  Indications:      Pre-CABG. Risk Factors:     Hypertension, Diabetes, coronary artery disease. Comparison Study: no prior Performing Technologist: Archie Patten RVS  Examination Guidelines: A complete evaluation includes B-mode imaging, spectral Doppler, color Doppler, and power Doppler as needed of all accessible portions of each vessel. Bilateral testing is considered an integral part of a complete examination. Limited examinations for reoccurring indications may be performed as noted.  Right Carotid Findings: +----------+--------+--------+--------+------------+--------+           PSV cm/sEDV cm/sStenosisDescribe    Comments +----------+--------+--------+--------+------------+--------+ CCA Prox  66      9               heterogenous          +----------+--------+--------+--------+------------+--------+ CCA Distal73      14              heterogenous         +----------+--------+--------+--------+------------+--------+ ICA Prox  60      20      1-39%   heterogenous         +----------+--------+--------+--------+------------+--------+ ICA Distal46      21                                   +----------+--------+--------+--------+------------+--------+ ECA       128                                          +----------+--------+--------+--------+------------+--------+ +----------+--------+-------+--------+------------+           PSV cm/sEDV cmsDescribeArm Pressure +----------+--------+-------+--------+------------+ Subclavian135                                 +----------+--------+-------+--------+------------+ +---------+--------+--+--------+--+---------+ VertebralPSV cm/s37EDV cm/s17Antegrade +---------+--------+--+--------+--+---------+ Left Carotid Findings: +----------+--------+--------+--------+------------+--------+           PSV cm/sEDV cm/sStenosisDescribe    Comments +----------+--------+--------+--------+------------+--------+ CCA Prox  102     20              heterogenous         +----------+--------+--------+--------+------------+--------+ CCA Distal75      12              heterogenous         +----------+--------+--------+--------+------------+--------+ ICA Prox  62      18      1-39%   heterogenous         +----------+--------+--------+--------+------------+--------+ ICA Distal54      26                                   +----------+--------+--------+--------+------------+--------+ ECA       118     4                                    +----------+--------+--------+--------+------------+--------+ +----------+--------+--------+--------+------------+  SubclavianPSV cm/sEDV cm/sDescribeArm Pressure +----------+--------+--------+--------+------------+            46                                   +----------+--------+--------+--------+------------+ +---------+--------+--------+--------------+ VertebralPSV cm/sEDV cm/sNot identified +---------+--------+--------+--------------+  ABI Findings: +--------+------------------+-----+---------+--------+ Right   Rt Pressure (mmHg)IndexWaveform Comment  +--------+------------------+-----+---------+--------+ [id number]                    triphasic         +--------+------------------+-----+---------+--------+ PTA     175               1.30 triphasic         +--------+------------------+-----+---------+--------+ DP      156               1.16 triphasic         +--------+------------------+-----+---------+--------+ +--------+------------------+-----+---------+-------+ Left    Lt Pressure (mmHg)IndexWaveform Comment +--------+------------------+-----+---------+-------+ Brachial                       triphasiciv      +--------+------------------+-----+---------+-------+ PTA     135               1.00 triphasic        +--------+------------------+-----+---------+-------+ DP      135               1.00 triphasic        +--------+------------------+-----+---------+-------+ +-------+---------------+----------------+ ABI/TBIToday's ABI/TBIPrevious ABI/TBI +-------+---------------+----------------+ Right  1.30                            +-------+---------------+----------------+ Left   1.00                            +-------+---------------+----------------+  Right Doppler Findings: +--------+--------+-----+---------+--------+ Site    PressureIndexDoppler  Comments +--------+--------+-----+---------+--------+ [id number]          triphasic         +--------+--------+-----+---------+--------+ Radial               triphasic         +--------+--------+-----+---------+--------+ Ulnar                triphasic          +--------+--------+-----+---------+--------+  Left Doppler Findings: +--------+--------+-----+---------+--------+ Site    PressureIndexDoppler  Comments +--------+--------+-----+---------+--------+ Brachial             triphasiciv       +--------+--------+-----+---------+--------+ Radial               triphasic         +--------+--------+-----+---------+--------+ Ulnar                triphasic         +--------+--------+-----+---------+--------+  Summary: Right Carotid: Velocities in the right ICA are consistent with a 1-39% stenosis. Left Carotid: Velocities in the left ICA are consistent with a 1-39% stenosis. Vertebrals: Right vertebral artery demonstrates antegrade flow. Left vertebral             artery was not visualized. Right ABI: Resting right ankle-brachial index indicates noncompressible right lower extremity arteries. Left ABI: Resting left ankle-brachial index is within normal range. No evidence of significant left lower extremity arterial disease. Right Upper Extremity: Doppler waveforms remain within normal limits with right radial compression.  Doppler waveforms remain within normal limits with right ulnar compression. Left Upper Extremity: Doppler waveforms remain within normal limits with left radial compression. Doppler waveforms remain within normal limits with left ulnar compression.     Preliminary     Medications:     stroke: mapping our early stages of recovery book   Does not apply Once   aspirin  81 mg Oral Daily   atorvastatin  80 mg Oral Daily   carvedilol  25 mg Oral BID WC   folic acid  1 mg Oral Daily   insulin aspart  0-15 Units Subcutaneous TID WC   insulin aspart  0-5 Units Subcutaneous QHS   insulin glargine-yfgn  15 Units Subcutaneous QHS   multivitamin with minerals  1 tablet Oral Daily   sodium chloride flush  3 mL Intravenous Q12H   sodium chloride flush  3 mL Intravenous Q12H   sodium chloride flush  3 mL Intravenous Q12H   thiamine  100  mg Oral Daily   Or   thiamine  100 mg Intravenous Daily   Continuous Infusions:  sodium chloride     sodium chloride     nitroGLYCERIN 60 mcg/min (12/28/20 0114)     LOS: 4 days   Geradine Girt  [facility]   How to contact the [facility] Attending or Consulting provider [place] or covering provider during after hours [place], for this patient?  Check the care team in [facility] and look for a) attending/consulting TR[facility] provider listed and b) the [facility] team listed Log into [email or web address] and use [place]'s universal password to access. If you do not have the password, please contact the hospital operator. Locate the [facility] provider you are looking for under [facility] and page to a number that you can be directly reached. If you still have difficulty reaching the provider, please page the [facility] (Director on Call) for the Hospitalists listed on amion for assistance.  12/28/2020, 2:37 PM

## 2020-12-29 DIAGNOSIS — E1169 Type 2 diabetes mellitus with other specified complication: Secondary | ICD-10-CM | POA: Diagnosis not present

## 2020-12-29 DIAGNOSIS — R079 Chest pain, unspecified: Secondary | ICD-10-CM

## 2020-12-29 DIAGNOSIS — E669 Obesity, unspecified: Secondary | ICD-10-CM | POA: Diagnosis not present

## 2020-12-29 DIAGNOSIS — I6340 Cerebral infarction due to embolism of unspecified cerebral artery: Secondary | ICD-10-CM | POA: Diagnosis not present

## 2020-12-29 LAB — CBC
HCT: 37.8 % — ABNORMAL LOW (ref 39.0–52.0)
Hemoglobin: 12.8 g/dL — ABNORMAL LOW (ref 13.0–17.0)
MCH: 31.3 pg (ref 26.0–34.0)
MCHC: 33.9 g/dL (ref 30.0–36.0)
MCV: 92.4 fL (ref 80.0–100.0)
Platelets: 296 10*3/uL (ref 150–400)
RBC: 4.09 MIL/uL — ABNORMAL LOW (ref 4.22–5.81)
RDW: 12.4 % (ref 11.5–15.5)
WBC: 9.7 10*3/uL (ref 4.0–10.5)
nRBC: 0 % (ref 0.0–0.2)

## 2020-12-29 LAB — BASIC METABOLIC PANEL
Anion gap: 8 (ref 5–15)
BUN: 16 mg/dL (ref 6–20)
CO2: 24 mmol/L (ref 22–32)
Calcium: 8.8 mg/dL — ABNORMAL LOW (ref 8.9–10.3)
Chloride: 104 mmol/L (ref 98–111)
Creatinine, Ser: 1.72 mg/dL — ABNORMAL HIGH (ref 0.61–1.24)
GFR, Estimated: 45 mL/min — ABNORMAL LOW (ref >60)
Glucose, Bld: 176 mg/dL — ABNORMAL HIGH (ref 70–99)
Potassium: 3.5 mmol/L (ref 3.5–5.1)
Sodium: 136 mmol/L (ref 135–145)

## 2020-12-29 LAB — GLUCOSE, CAPILLARY
Glucose-Capillary: 174 mg/dL — ABNORMAL HIGH (ref 70–99)
Glucose-Capillary: 196 mg/dL — ABNORMAL HIGH (ref 70–99)
Glucose-Capillary: 163 mg/dL — ABNORMAL HIGH (ref 70–99)
Glucose-Capillary: 218 mg/dL — ABNORMAL HIGH (ref 70–99)

## 2020-12-29 NOTE — Evaluation (Signed)
Physical Therapy Evaluation/ Discharge Patient Details Name: [name] MRN: [id number] DOB: 10-Nov-1960 Today's Date: 12/29/2020  History of Present Illness  60 yo admitted 11/14 with chest pain with NSTEMI. S/p cath 11/16 with severe CAD and speech difficulties post. Head CT 11/17 and MRI 11/18 determined Rt cerebellar CVA. Pt recommended for CABG but currently on hold. PMHx: CAD, DM, HTN, HLD, ETOH abuse, left cerebellar CVA  Clinical Impression  Pt pleasant and reports he can recognize mild verbal changes since CVA but no other deficits. Pt with strength and sensation WFL bil UE and LE with pt able to perform all transfers, gait and stairs without assist. Pt educated for sternal precautions for future CABG and able to demonstrate sit to stand without use of UE. Pt at baseline functional level without further need for acute therapy with pt aware and agreeable. Pt does admit to lack of mobility at baseline lately and educated for walking program for heart health.  HR 87-101 SpO2 95% on RA       Recommendations for follow up therapy are one component of a multi-disciplinary discharge planning process, led by the attending physician.  Recommendations may be updated based on patient status, additional functional criteria and insurance authorization.  Follow Up Recommendations No PT follow up    Assistance Recommended at Discharge None  Functional Status Assessment Patient has not had a recent decline in their functional status  Equipment Recommendations  None recommended by PT    Recommendations for Other Services       Precautions / Restrictions Precautions Precautions: None      Mobility  Bed Mobility Overal bed mobility: Independent                  Transfers Overall transfer level: Independent                      Ambulation/Gait Ambulation/Gait assistance: Independent Gait Distance (Feet): 300 Feet Assistive device: None Gait Pattern/deviations:  WFL(Within Functional Limits)   Gait velocity interpretation: >2.62 ft/sec, indicative of community ambulatory   General Gait Details: pt with ability to perform head turns, change of speed and direction without LOB  Stairs Stairs: Yes Stairs assistance: Modified independent (Device/Increase time) Stair Management: One rail Right;Forwards;Alternating pattern Number of Stairs: 8 General stair comments: good stablity with use of rail  Wheelchair Mobility    Modified Rankin (Stroke Patients Only) Modified Rankin (Stroke Patients Only) Pre-Morbid Rankin Score: No symptoms Modified Rankin: No symptoms     Balance Overall balance assessment: No apparent balance deficits (not formally assessed)                                           Pertinent Vitals/Pain Pain Assessment: No/denies pain    Home Living Family/patient expects to be discharged to:: Private residence Living Arrangements: Other relatives Available Help at Discharge: Family;Available 24 hours/day Type of Home: House Home Access: Stairs to enter Entrance Stairs-Rails: Left;Right;Can reach both Entrance Stairs-Number of Steps: 14   Home Layout: Two level;Laundry or work area in [employer]: None      Prior Function Prior Level of Function : Independent/Modified Independent                     Journalist, newspaper        Extremity/Trunk Assessment   Upper Extremity Assessment Upper Extremity Assessment:  Overall [facility] for tasks assessed    Lower Extremity Assessment Lower Extremity Assessment: Overall WFL for tasks assessed    Cervical / Trunk Assessment Cervical / Trunk Assessment: Normal  Communication   Communication: No difficulties  Cognition Arousal/Alertness: Awake/alert Behavior During Therapy: WFL for tasks assessed/performed Overall Cognitive Status: Within Functional Limits for tasks assessed                                           General Comments      Exercises     Assessment/Plan    PT Assessment Patient does not need any further PT services  PT Problem List         PT Treatment Interventions      PT Goals (Current goals can be found in the Care Plan section)  Acute Rehab PT Goals PT Goal Formulation: All assessment and education complete, DC therapy    Frequency     Barriers to discharge        Co-evaluation               AM-PAC PT "6 Clicks" Mobility  Outcome Measure Help needed turning from your back to your side while in a flat bed without using bedrails?: None Help needed moving from lying on your back to sitting on the side of a flat bed without using bedrails?: None Help needed moving to and from a bed to a chair (including a wheelchair)?: None Help needed standing up from a chair using your arms (e.g., wheelchair or bedside chair)?: None Help needed to walk in hospital room?: None Help needed climbing 3-5 steps with a railing? : None 6 Click Score: 24    End of Session   Activity Tolerance: Patient tolerated treatment well Patient left: in chair;with call bell/phone within reach Nurse Communication: Mobility status PT Visit Diagnosis: Other abnormalities of gait and mobility (R26.89)    Time: 5176-1607 PT Time Calculation (min) (ACUTE ONLY): 22 min   Charges:   PT Evaluation $PT Eval Moderate Complexity: 1 Mod          [place], PT Acute Rehabilitation Services Pager: [phone] Office: [phone]   [name] 12/29/2020, 12:13 PM

## 2020-12-29 NOTE — Progress Notes (Addendum)
Progress Note    [name]  CWC:[id number] DOB: 1960/07/05  DOA: 12/24/2020 PCP: Patient, No Pcp Per (Inactive)    Brief Narrative:     Medical records reviewed and are as summarized below:  Tirth Cothron is an 60 y.o. male who presented with complaints of midsternal chest pain/achiness that was accompanied by dyspnea. He stated that it was similar to the feeling he had when he had an MI.  S/P cath and CABG was recommended but patient found to have CVAs.     Assessment/Plan:   Active Problems:   Chest pain   AKI (acute kidney injury) ([place])   Cerebral embolism with cerebral infarction   CVA -only complaint is speech difficulty -MRI:  1.Large acute infarct of the superior right cerebellum with mild surrounding edema and petechial hemorrhage. 2. Multiple scattered punctate acute infarcts in multiple vascular territories, consistent with a central cardiac or aortic embolic source. 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 -neurology consult appreciated -PT/OT -statin -ASA/plavix x 3 months  NSTEMI     - Cath showed 90% prox-mid RCA, 70-80% RV marginal branches and mild distal RCA disease. Unable to engage left system on initial cath and therefore repeat cath performed 11/16 which showed severe multivessel CAD and was recommended for CABG but plans on hold due to CVA -wean off nitro -outpatient plan for CABG   HTN urgency     - coreg     - per cards   AKI? Vs CKD     - renal US unremarkable     - hold lisinopril; watch nephrotoxins     - CR stable   DM2     -A1C is 9/9-- improved from 5 years ago -goal of <7   HLD/CAD     - ASA, statin, BB   Tobacco abuse/EtOH abuse/Marijuana abuse     - counseled against further use     - no sign of withdrawal  obesity Body mass index is 34.46 kg/m.   Family Communication/Anticipated D/C date and plan/Code Status    Code Status: Full Code.  Disposition Plan: Status is: Inpatient  Remains inpatient appropriate because:  needs CVA work up         Medical Consultants:   [facility]      Subjective:   Still does not feel like himself  Objective:    Vitals:   12/29/20 0441 12/29/20 0724 12/29/20 1104 12/29/20 1137  BP: (!) 141/92 (!) 152/92 (!) 163/98 (!) 143/88  Pulse: 81 78 82 74  Resp: 18 20  18   Temp: 97.9 F (36.6 C) 98.7 F (37.1 C)  98.2 F (36.8 C)  TempSrc: Oral Oral  Oral  SpO2: (!) 89% 94%  93%  Weight: 101.3 kg     Height:        Intake/Output Summary (Last 24 hours) at 12/29/2020 1438 Last data filed at 12/29/2020 1330 Gross per 24 hour  Intake 798 ml  Output 700 ml  Net 98 ml   Filed Weights   12/27/20 0550 12/28/20 0525 12/29/20 0441  Weight: 101.7 kg 101.4 kg 101.3 kg    Exam:  General: Appearance:    Obese male in no acute distress     Lungs:     respirations unlabored  Heart:    Normal heart rate.   MS:   All extremities are intact.    Neurologic:   Awake, alert, oriented x 3         Data Reviewed:   I  have personally reviewed following labs and imaging studies:  Labs: Labs show the following:   Basic Metabolic Panel: Recent Labs  Lab 12/25/20 0031 12/25/20 1046 12/26/20 0232 12/27/20 0143 12/29/20 0231  NA 134* 136 133* 136 136  K 3.8 3.5 3.8 3.9 3.5  CL 99 101 101 99 104  CO2 27 30 26 27 24   GLUCOSE 279* 164* 248* 168* 176*  BUN 20 16 14 11 16   CREATININE 1.83* 1.77* 1.63* 1.52* 1.72*  CALCIUM 9.3 8.8* 8.2* 8.6* 8.8*   GFR Estimated Creatinine Clearance: 52.3 mL/min (A) (by C-G formula based on SCr of 1.72 mg/dL (H)). Liver Function Tests: Recent Labs  Lab 12/24/20 0651 12/25/20 0031  AST 32 22  ALT 42 32  ALKPHOS 131* 95  BILITOT 0.5 0.8  PROT 7.4 6.3*  ALBUMIN 3.9 3.2*   No results for input(s): LIPASE, AMYLASE in the last 168 hours. No results for input(s): AMMONIA in the last 168 hours. Coagulation profile No results for input(s): INR, PROTIME in the last 168 hours.  CBC: Recent Labs  Lab  12/24/20 0651 12/25/20 0031 12/26/20 0232 12/27/20 0143 12/28/20 0216 12/29/20 0231  WBC 10.8* 12.7* 9.0 10.1 10.0 9.7  NEUTROABS 5.7  --   --   --   --   --   HGB 16.2 14.5 12.8* 13.3 12.8* 12.8*  HCT 46.4 42.4 37.4* 40.2 37.9* 37.8*  MCV 90.8 92.0 92.6 93.3 92.4 92.4  PLT 242 250 210 224 226 296   Cardiac Enzymes: No results for input(s): CKTOTAL, CKMB, CKMBINDEX, TROPONINI in the last 168 hours. BNP (last 3 results) No results for input(s): PROBNP in the last 8760 hours. CBG: Recent Labs  Lab 12/28/20 1142 12/28/20 1655 12/28/20 2124 12/29/20 0603 12/29/20 1140  GLUCAP 183* 179* 181* 174* 196*   D-Dimer: No results for input(s): DDIMER in the last 72 hours. Hgb A1c: No results for input(s): HGBA1C in the last 72 hours.  Lipid Profile: No results for input(s): CHOL, HDL, LDLCALC, TRIG, CHOLHDL, LDLDIRECT in the last 72 hours.  Thyroid function studies: No results for input(s): TSH, T4TOTAL, T3FREE, THYROIDAB in the last 72 hours.  Invalid input(s): FREET3 Anemia work up: No results for input(s): VITAMINB12, FOLATE, FERRITIN, TIBC, IRON, RETICCTPCT in the last 72 hours. Sepsis Labs: Recent Labs  Lab 12/26/20 0232 12/27/20 0143 12/28/20 0216 12/29/20 0231  WBC 9.0 10.1 10.0 9.7    Microbiology Recent Results (from the past 240 hour(s))  Resp Panel by RT-PCR (Flu A&B, Covid) Nasopharyngeal Swab     Status: None   Collection Time: 12/24/20 12:57 PM   Specimen: Nasopharyngeal Swab; Nasopharyngeal(NP) swabs in vial transport medium  Result Value Ref Range Status   SARS Coronavirus 2 by RT PCR NEGATIVE NEGATIVE Final    Comment: (NOTE) SARS-CoV-2 target nucleic acids are NOT DETECTED.  The SARS-CoV-2 RNA is generally detectable in upper respiratory specimens during the acute phase of infection. The lowest concentration of SARS-CoV-2 viral copies this assay can detect is 138 copies/mL. A negative result does not preclude SARS-Cov-2 infection and should not  be used as the sole basis for treatment or other patient management decisions. A negative result may occur with  improper specimen collection/handling, submission of specimen other than nasopharyngeal swab, presence of viral mutation(s) within the areas targeted by this assay, and inadequate number of viral copies(<138 copies/mL). A negative result must be combined with clinical observations, patient history, and epidemiological information. The expected result is Negative.  Fact Sheet for Patients:  EntrepreneurPulse.com.au  Fact Sheet for Healthcare Providers:  IncredibleEmployment.be  This test is no t yet approved or cleared by the [place] FDA and  has been authorized for detection and/or diagnosis of SARS-CoV-2 by FDA under an Emergency Use Authorization (EUA). This EUA will remain  in effect (meaning this test can be used) for the duration of the COVID-19 declaration under Section 564(b)(1) of the Act, 21 U.S.C.section 360bbb-3(b)(1), unless the authorization is terminated  or revoked sooner.       Influenza A by PCR NEGATIVE NEGATIVE Final   Influenza B by PCR NEGATIVE NEGATIVE Final    Comment: (NOTE) The Xpert Xpress SARS-CoV-2/FLU/RSV plus assay is intended as an aid in the diagnosis of influenza from Nasopharyngeal swab specimens and should not be used as a sole basis for treatment. Nasal washings and aspirates are unacceptable for Xpert Xpress SARS-CoV-2/FLU/RSV testing.  Fact Sheet for Patients: EntrepreneurPulse.com.au  Fact Sheet for Healthcare Providers: IncredibleEmployment.be  This test is not yet approved or cleared by the [place] FDA and has been authorized for detection and/or diagnosis of SARS-CoV-2 by FDA under an Emergency Use Authorization (EUA). This EUA will remain in effect (meaning this test can be used) for the duration of the COVID-19 declaration under Section  564(b)(1) of the Act, 21 U.S.C. section 360bbb-3(b)(1), unless the authorization is terminated or revoked.  Performed at [facility], [place] [address] [place], [place] [address]     Procedures and diagnostic studies:  CT ANGIO HEAD NECK W WO CM  Result Date: 12/28/2020 CLINICAL DATA:  Neuro deficit, acute, stroke suspected EXAM: CT ANGIOGRAPHY HEAD AND NECK TECHNIQUE: Multidetector CT imaging of the head and neck was performed using the standard protocol during bolus administration of intravenous contrast. Multiplanar CT image reconstructions and MIPs were obtained to evaluate the vascular anatomy. Carotid stenosis measurements (when applicable) are obtained utilizing NASCET criteria, using the distal internal carotid diameter as the denominator. CONTRAST:  101mL OMNIPAQUE IOHEXOL 350 MG/ML SOLN COMPARISON:  12/27/2020 CT head, 12/28/2020 MRI head. FINDINGS: CT HEAD FINDINGS Brain: Redemonstrated hypodensity in the right cerebellar hemisphere, consistent with the infarct seen on MRI, with new areas of increased density, likely petechial hemorrhage, also consistent with the MRI. No new areas of hypodensity; no CT correlate is seen for other punctate infarcts seen on the MRI. No mass, mass effect, or midline shift Vascular: No hyperdense vessel. Skull: Normal. Negative for fracture or focal lesion. Sinuses: Mucous retention cysts in the left maxillary sinus and left ethmoid air cells. The mastoids are well aerated. Orbits: No acute finding. Review of the MIP images confirms the above findings CTA NECK FINDINGS Aortic arch: 3 vessel arch with a common origin of the brachiocephalic and left common carotid arteries, as well as an aortic origin the left vertebral artery. Imaged portion shows no evidence of aneurysm or dissection. Moderate stenosis of the left vertebral artery origin, secondary to calcified and noncalcified plaque (series 12, image 327). Right carotid system: No evidence of  dissection, stenosis (50% or greater) or occlusion. Calcified and noncalcified plaque at the bifurcation, with less than 50% stenosis. Left carotid system: No evidence of dissection, stenosis (50% or greater) or occlusion. Calcified and noncalcified plaque at the bifurcation, with less than 50% stenosis. Vertebral arteries: Moderate stenosis at the origin of the left vertebral artery. The left vertebral artery is otherwise patent through the V3 segment. Mild narrowing at the origin of the right vertebral artery, which is otherwise patent through the V3 segment.  Skeleton: Degenerative changes in the cervical spine. No acute osseous abnormality. Other neck: Significant enlargement of the right thyroid lobe, with multiple hypoattenuating nodules. Upper chest: Small bilateral pleural effusions with associated atelectasis. Multiple prominent mediastinal lymph nodes (series 12, image 362). Review of the MIP images confirms the above findings CTA HEAD FINDINGS Anterior circulation: Multifocal calcified and noncalcified narrowing of the right intracranial ICA, which is severe in the cavernous (series 12, image 113), and ophthalmic/supraclinoid segments (series 12, image 108). Severe narrowing in the distal left cavernous ICA (series 12, images 109-111) and supraclinoid segments (series 12, image 102. Mild to moderate narrowing of the distal right A1. Normal left A1. Normal anterior communicating artery. There is mild irregularity of the distal ACAs, but they remain patent. Mild irregularity in the M1 segments, which are patent. Normal bifurcations. Distal MCA segments are mildly irregular but perfused. Posterior circulation: Severe narrowing in the left V4, with focal nonopacification (series 12, image 145), with distal reconstitution proximal to the vertebrobasilar junction, possibly retrograde flow. Moderate focal narrowing in the right V4 segment (series 12, images 150-155). Severe narrowing of the basilar artery (series  12, image 122), without complete occlusion. Likely severe focal narrowing at the origin of the right superior cerebellar artery. The origin of the left superior cerebellar artery appears patent. The right posterior communicating artery is patent, with moderate focal stenosis near the origin (series 12, image 106). Diminutive right P1. The left posterior is patent. Moderate stenosis at the origin of the left P1. Diminutive and somewhat irregular P2 and P3 segments bilaterally. Venous sinuses: As permitted by contrast timing, patent. Anatomic variants: None significant Review of the MIP images confirms the above findings IMPRESSION: 1. Redemonstrated hypodensity in the right cerebellar hemisphere, consistent with the infarct seen on MRI, with petechial hemorrhage. No new infarct is seen. 2. 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 3. Focal narrowing at the origin of the right superior cerebellar artery. 4. Severe narrowing in the left V4 segment, with focal occlusion and distal reconstitution, likely retrograde. Moderate focal narrowing is also seen in the right V4 segment. 5. Multifocal narrowing of the bilateral intracranial internal carotid arteries, which is severe in the cavernous, ophthalmic, and supraclinoid segments, as described above. 6. Moderate stenosis at the origin of the left vertebral artery, which originates from the aorta. Mild narrowing at the origin of the right vertebral artery. No other hemodynamically significant stenosis in the neck. 7. Small bilateral pleural effusions and prominent mediastinal lymph nodes. Consider CT of the chest for further evaluation. 8. Enlarged right thyroid lobe with multiple hypoattenuating nodules. This has not previously been evaluated, an ultrasound of the thyroid is recommended. Electronically Signed   By: Merilyn Baba M.D.   On: 12/28/2020 13:08   CT HEAD WO CONTRAST (5MM)  Result Date: 12/27/2020 CLINICAL DATA:  Neuro deficit,  acute, stroke suspected EXAM: CT HEAD WITHOUT CONTRAST TECHNIQUE: Contiguous axial images were obtained from the base of the skull through the vertex without intravenous contrast. COMPARISON:  None. FINDINGS: Brain: Hypodensity in the right cerebellum (series 3, image 9). No other hypodensity to suggest acute infarct. No acute hemorrhage. No mass, mass effect, or midline shift. No hydrocephalus or extra-axial collection. Vascular: No hyperdense vessel or unexpected calcification. Skull: Normal. Negative for fracture or focal lesion. Sinuses/Orbits: Mild mucosal thickening in the ethmoid air cells. Orbits unremarkable. Other: None. IMPRESSION: Hypodensity in the right cerebellum, of indeterminate acuity but likely chronic. Correlate with symptoms and consider MRI if clinically  indicated. These results will be called to the ordering clinician or representative by the Radiologist Assistant, and communication documented in the PACS or [employer]. Electronically Signed   By: Merilyn Baba M.D.   On: 12/27/2020 20:06   MR BRAIN WO CONTRAST  Result Date: 12/28/2020 CLINICAL DATA:  Acute neurologic deficit EXAM: MRI HEAD WITHOUT CONTRAST TECHNIQUE: Multiplanar, multiecho pulse sequences of the brain and surrounding structures were obtained without intravenous contrast. COMPARISON:  None. FINDINGS: Brain: There is a large acute or early subacute infarct of the superior right cerebellum. Additionally, there are multiple scattered punctate acute infarcts scattered throughout both hemispheres in the anterior and posterior circulation. There is petechial hemorrhage in the area of the right cerebellar infarct with mild surrounding edema . there is an old left cerebellar infarct. No chronic microhemorrhage. Normal midline structures. Vascular: Major flow voids are preserved. Skull and upper cervical spine: Normal calvarium and skull base. Visualized upper cervical spine and soft tissues are normal. Sinuses/Orbits:No  paranasal sinus fluid levels or advanced mucosal thickening. No mastoid or middle ear effusion. Normal orbits. IMPRESSION: 1. Large acute infarct of the superior right cerebellum with mild surrounding edema and petechial hemorrhage. 2. Multiple scattered punctate acute infarcts in multiple vascular territories, consistent with a central cardiac or aortic embolic source. 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 Electronically Signed   By: Ulyses Jarred M.D.   On: 12/28/2020 02:05   ECHOCARDIOGRAM LIMITED BUBBLE STUDY  Result Date: 12/28/2020    ECHOCARDIOGRAM LIMITED REPORT   Patient Name:   LUCCIANO VITALI Date of Exam: 12/28/2020 Medical Rec #:  [id number]  Height:       67.5 in Accession #:    [id number] Weight:       223.5 lb Date of Birth:  [birth year]  BSA:          2.132 m Patient Age:    87 years   BP:           138/88 mmHg Patient Gender: M          HR:           73 bpm. Exam Location:  Inpatient Procedure: Saline Contrast Bubble Study and 2D Echo Indications:    Stroke 434.91 / I63.9  History:        Patient has prior history of Echocardiogram examinations, most                 recent 12/24/2020. CAD; Risk Factors:Hypertension, Diabetes and                 Dyslipidemia.  Sonographer:    Darlina Sicilian RDCS Referring Phys: [id number] Anthony  1. Agitated saline contrast bubble study was negative, with no evidence of any interatrial shunt. FINDINGS  IAS/Shunts: Agitated saline contrast was given intravenously to evaluate for intracardiac shunting. Agitated saline contrast bubble study was negative, with no evidence of any interatrial shunt. There is no evidence of a patent foramen ovale. There is no evidence of an atrial septal defect. Skeet Latch MD Electronically signed by Skeet Latch MD Signature Date/Time: 12/28/2020/6:32:55 PM    Final    VAS US DOPPLER PRE CABG  Result Date: 12/29/2020 PREOPERATIVE VASCULAR EVALUATION Patient Name:  KAYLOB WALLEN  Date of Exam:   12/28/2020 Medical  Rec #: [id number]   Accession #:    [id number] Date of Birth: [birth year]   Patient Gender: M Patient Age:   31 years Exam Location[facility]  [facility] Procedure:  VAS US DOPPLER PRE CABG Referring Phys: BRYAN BARTLE --------------------------------------------------------------------------------  Indications:      Pre-CABG. Risk Factors:     Hypertension, Diabetes, coronary artery disease. Comparison Study: no prior Performing Technologist: Archie Patten RVS  Examination Guidelines: A complete evaluation includes B-mode imaging, spectral Doppler, color Doppler, and power Doppler as needed of all accessible portions of each vessel. Bilateral testing is considered an integral part of a complete examination. Limited examinations for reoccurring indications may be performed as noted.  Right Carotid Findings: +----------+--------+--------+--------+------------+--------+           PSV cm/sEDV cm/sStenosisDescribe    Comments +----------+--------+--------+--------+------------+--------+ CCA Prox  66      9               heterogenous         +----------+--------+--------+--------+------------+--------+ CCA Distal73      14              heterogenous         +----------+--------+--------+--------+------------+--------+ ICA Prox  60      20      1-39%   heterogenous         +----------+--------+--------+--------+------------+--------+ ICA Distal46      21                                   +----------+--------+--------+--------+------------+--------+ ECA       128                                          +----------+--------+--------+--------+------------+--------+ +----------+--------+-------+--------+------------+           PSV cm/sEDV cmsDescribeArm Pressure +----------+--------+-------+--------+------------+ Subclavian135                                 +----------+--------+-------+--------+------------+ +---------+--------+--+--------+--+---------+ VertebralPSV  cm/s37EDV cm/s17Antegrade +---------+--------+--+--------+--+---------+ Left Carotid Findings: +----------+--------+--------+--------+------------+--------+           PSV cm/sEDV cm/sStenosisDescribe    Comments +----------+--------+--------+--------+------------+--------+ CCA Prox  102     20              heterogenous         +----------+--------+--------+--------+------------+--------+ CCA Distal75      12              heterogenous         +----------+--------+--------+--------+------------+--------+ ICA Prox  62      18      1-39%   heterogenous         +----------+--------+--------+--------+------------+--------+ ICA Distal54      26                                   +----------+--------+--------+--------+------------+--------+ ECA       118     4                                    +----------+--------+--------+--------+------------+--------+ +----------+--------+--------+--------+------------+ SubclavianPSV cm/sEDV cm/sDescribeArm Pressure +----------+--------+--------+--------+------------+           46                                   +----------+--------+--------+--------+------------+ +---------+--------+--------+--------------+  [place] cm/sEDV cm/sNot identified +---------+--------+--------+--------------+  ABI Findings: +--------+------------------+-----+---------+--------+ Right   Rt Pressure (mmHg)IndexWaveform Comment  +--------+------------------+-----+---------+--------+ [id number]                    triphasic         +--------+------------------+-----+---------+--------+ PTA     175               1.30 triphasic         +--------+------------------+-----+---------+--------+ DP      156               1.16 triphasic         +--------+------------------+-----+---------+--------+ +--------+------------------+-----+---------+-------+ Left    Lt Pressure (mmHg)IndexWaveform Comment  +--------+------------------+-----+---------+-------+ Brachial                       triphasiciv      +--------+------------------+-----+---------+-------+ PTA     135               1.00 triphasic        +--------+------------------+-----+---------+-------+ DP      135               1.00 triphasic        +--------+------------------+-----+---------+-------+ +-------+---------------+----------------+ ABI/TBIToday's ABI/TBIPrevious ABI/TBI +-------+---------------+----------------+ Right  1.30                            +-------+---------------+----------------+ Left   1.00                            +-------+---------------+----------------+  Right Doppler Findings: +--------+--------+-----+---------+--------+ Site    PressureIndexDoppler  Comments +--------+--------+-----+---------+--------+ [id number]          triphasic         +--------+--------+-----+---------+--------+ Radial               triphasic         +--------+--------+-----+---------+--------+ Ulnar                triphasic         +--------+--------+-----+---------+--------+  Left Doppler Findings: +--------+--------+-----+---------+--------+ Site    PressureIndexDoppler  Comments +--------+--------+-----+---------+--------+ Brachial             triphasiciv       +--------+--------+-----+---------+--------+ Radial               triphasic         +--------+--------+-----+---------+--------+ Ulnar                triphasic         +--------+--------+-----+---------+--------+  Summary: Right Carotid: Velocities in the right ICA are consistent with a 1-39% stenosis. Left Carotid: Velocities in the left ICA are consistent with a 1-39% stenosis. Vertebrals: Right vertebral artery demonstrates antegrade flow. Left vertebral             artery was not visualized. Right ABI: Resting right ankle-brachial index indicates noncompressible right lower extremity arteries. Left ABI: Resting  left ankle-brachial index is within normal range. No evidence of significant left lower extremity arterial disease. Right Upper Extremity: Doppler waveforms remain within normal limits with right radial compression. Doppler waveforms remain within normal limits with right ulnar compression. Left Upper Extremity: Doppler waveforms remain within normal limits with left radial compression. Doppler waveforms remain within normal limits with left ulnar compression.  Electronically signed by Jamelle Haring on 12/29/2020 at 11:09:11 AM.    Final  Medications:     stroke: mapping our early stages of recovery book   Does not apply Once   aspirin EC  325 mg Oral Daily   atorvastatin  80 mg Oral Daily   carvedilol  25 mg Oral BID WC   clopidogrel  75 mg Oral Daily   folic acid  1 mg Oral Daily   insulin aspart  0-15 Units Subcutaneous TID WC   insulin aspart  0-5 Units Subcutaneous QHS   insulin glargine-yfgn  15 Units Subcutaneous QHS   multivitamin with minerals  1 tablet Oral Daily   sodium chloride flush  3 mL Intravenous Q12H   sodium chloride flush  3 mL Intravenous Q12H   sodium chloride flush  3 mL Intravenous Q12H   thiamine  100 mg Oral Daily   Or   thiamine  100 mg Intravenous Daily   Continuous Infusions:  sodium chloride     sodium chloride     nitroGLYCERIN 33.333 mcg/min (12/29/20 1140)     LOS: 5 days   Geradine Girt  [facility]   How to contact the [facility] Attending or Consulting provider [place] or covering provider during after hours [place], for this patient?  Check the care team in [facility] and look for a) attending/consulting [facility] provider listed and b) the [facility] team listed Log into [email or web address] and use [place]'s universal password to access. If you do not have the password, please contact the hospital operator. Locate the [facility] provider you are looking for under [facility] and page to a number that you can be directly reached. If you still have difficulty  reaching the provider, please page the [facility] (Director on Call) for the Hospitalists listed on amion for assistance.  12/29/2020, 2:38 PM

## 2020-12-29 NOTE — Progress Notes (Addendum)
0845 Pt complained of chest tightness/pain 6/10. BP 163/98. Nitro drip resumed @ same rate 69mcg. 3818-  Pt verbalized relief. Pain score 0

## 2020-12-29 NOTE — Progress Notes (Signed)
OT Cancellation Note  Patient Details Name: [name] MRN: [id number] DOB: 10-04-1960   Cancelled Treatment:    Reason Eval/Treat Not Completed: OT screened, no needs identified, will sign off (OT screened with no needs identified, pt at baseline. Will sign off, thank you.)  [name] A Nishan Ovens 12/29/2020, 12:17 PM

## 2020-12-29 NOTE — Progress Notes (Signed)
Educated patient on the signs and symptoms of a Stroke. Patient was able to verbalize 2-3 of the symptoms. He acknowledge recent of the Educational Booklet about Strokes.   Patient is very aware of high risk behaviors that leads to strokes.

## 2020-12-29 NOTE — Progress Notes (Signed)
Dr Jacolyn Reedy rounding note reviewed, no additional cardiology recommendations over the weekend. Please call with questions.    Carlyle Dolly MD

## 2020-12-29 NOTE — Plan of Care (Signed)

## 2020-12-30 ENCOUNTER — Inpatient Hospital Stay (HOSPITAL_COMMUNITY): Payer: BC Managed Care – PPO

## 2020-12-30 DIAGNOSIS — I214 Non-ST elevation (NSTEMI) myocardial infarction: Secondary | ICD-10-CM | POA: Diagnosis not present

## 2020-12-30 DIAGNOSIS — E669 Obesity, unspecified: Secondary | ICD-10-CM | POA: Diagnosis not present

## 2020-12-30 DIAGNOSIS — I6340 Cerebral infarction due to embolism of unspecified cerebral artery: Secondary | ICD-10-CM | POA: Diagnosis not present

## 2020-12-30 DIAGNOSIS — E1169 Type 2 diabetes mellitus with other specified complication: Secondary | ICD-10-CM | POA: Diagnosis not present

## 2020-12-30 LAB — CBC
HCT: 40.6 % (ref 39.0–52.0)
Hemoglobin: 13.6 g/dL (ref 13.0–17.0)
MCH: 31 pg (ref 26.0–34.0)
MCHC: 33.5 g/dL (ref 30.0–36.0)
MCV: 92.5 fL (ref 80.0–100.0)
Platelets: 237 10*3/uL (ref 150–400)
RBC: 4.39 MIL/uL (ref 4.22–5.81)
RDW: 12.4 % (ref 11.5–15.5)
WBC: 10.3 10*3/uL (ref 4.0–10.5)
nRBC: 0 % (ref 0.0–0.2)

## 2020-12-30 LAB — GLUCOSE, CAPILLARY
Glucose-Capillary: 122 mg/dL — ABNORMAL HIGH (ref 70–99)
Glucose-Capillary: 137 mg/dL — ABNORMAL HIGH (ref 70–99)
Glucose-Capillary: 150 mg/dL — ABNORMAL HIGH (ref 70–99)
Glucose-Capillary: 164 mg/dL — ABNORMAL HIGH (ref 70–99)

## 2020-12-30 LAB — BASIC METABOLIC PANEL
Anion gap: 6 (ref 5–15)
BUN: 14 mg/dL (ref 6–20)
CO2: 24 mmol/L (ref 22–32)
Calcium: 8.7 mg/dL — ABNORMAL LOW (ref 8.9–10.3)
Chloride: 106 mmol/L (ref 98–111)
Creatinine, Ser: 1.78 mg/dL — ABNORMAL HIGH (ref 0.61–1.24)
GFR, Estimated: 43 mL/min — ABNORMAL LOW (ref >60)
Glucose, Bld: 120 mg/dL — ABNORMAL HIGH (ref 70–99)
Potassium: 3.5 mmol/L (ref 3.5–5.1)
Sodium: 136 mmol/L (ref 135–145)

## 2020-12-30 IMAGING — CT CT ANGIO CHEST
3 of 7 series · 17 of 46 positions shown · IV contrast (omnipaque)
Comparison: [DATE], [DATE], [DATE]

CLINICAL DATA: Aortic disease, preoperative evaluation, history of
stroke

EXAM:
CT ANGIOGRAPHY CHEST WITH CONTRAST
TECHNIQUE: Multidetector CT imaging of the chest was performed using the
standard protocol during bolus administration of intravenous
contrast. Multiplanar CT image reconstructions and MIPs were
obtained to evaluate the vascular anatomy.
CONTRAST:  80mL OMNIPAQUE IOHEXOL 350 MG/ML SOLN

[Series 6: arterial · axial · arterial · 0.87mm/px · z∈[+1330,+1608]mm · 11 of 167 slices shown]
[im 14/167  lung]
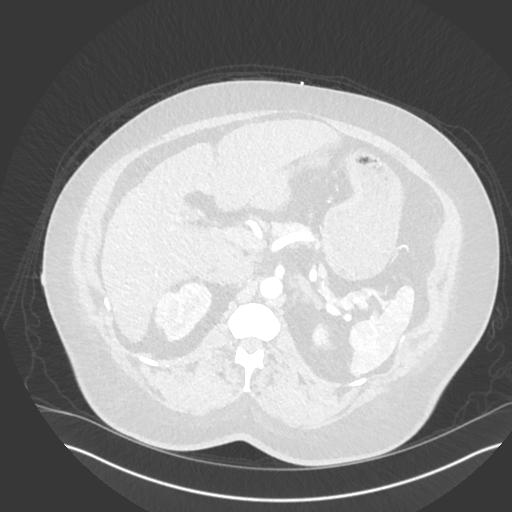
[im 28/167  soft-tissue]
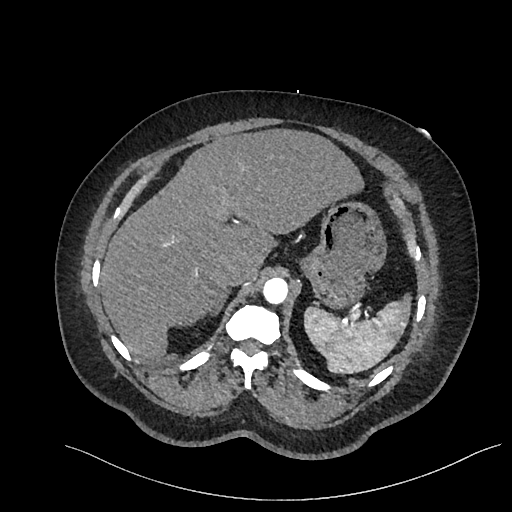
[im 42/167  lung]
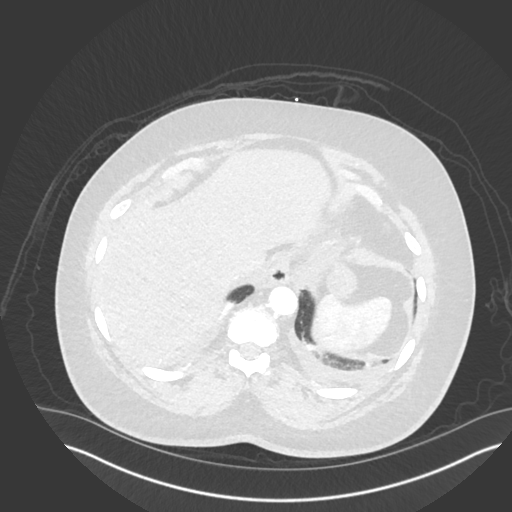
[im 56/167  soft-tissue]
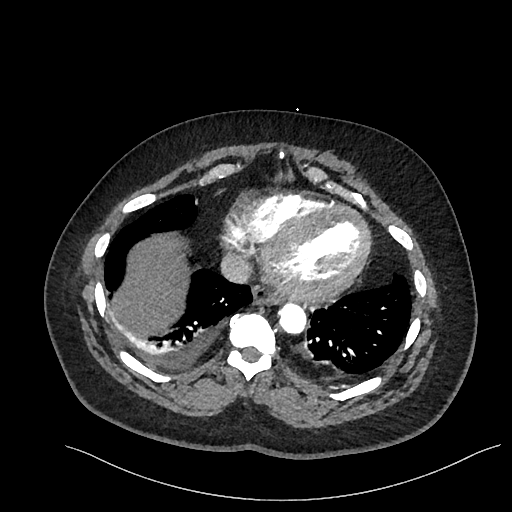
[im 70/167  lung]
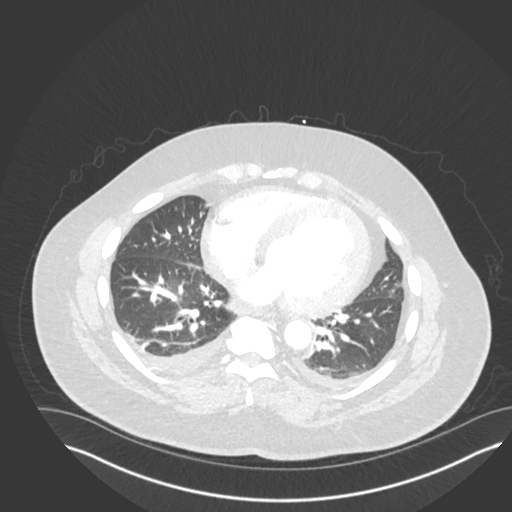
[im 84/167  soft-tissue]
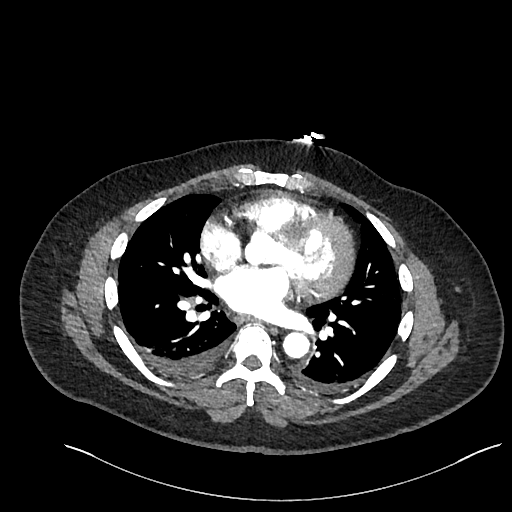
[im 97/167  lung]
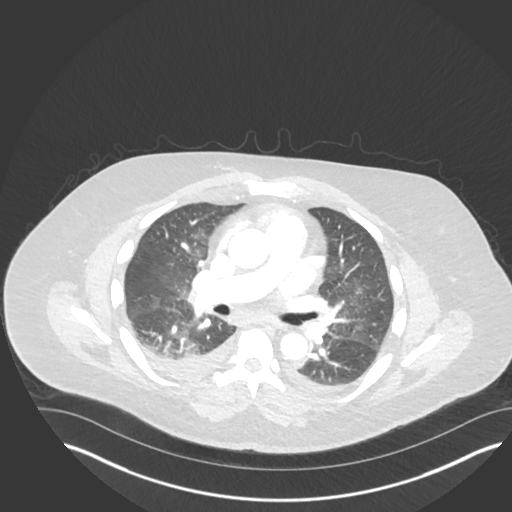
[im 111/167  soft-tissue]
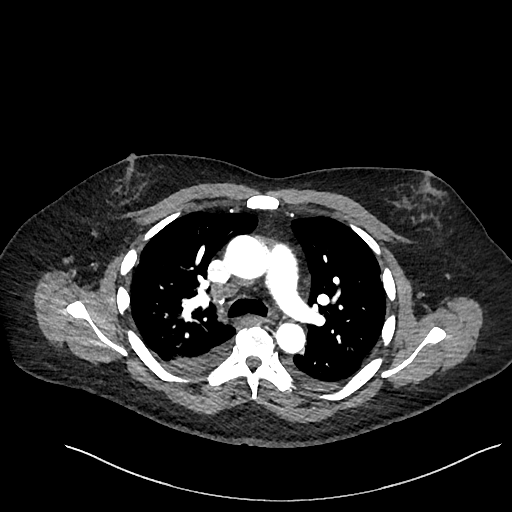
[im 125/167  lung]
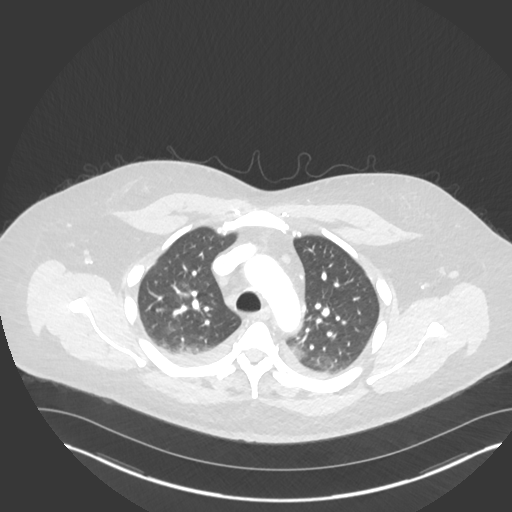
[im 139/167  soft-tissue]
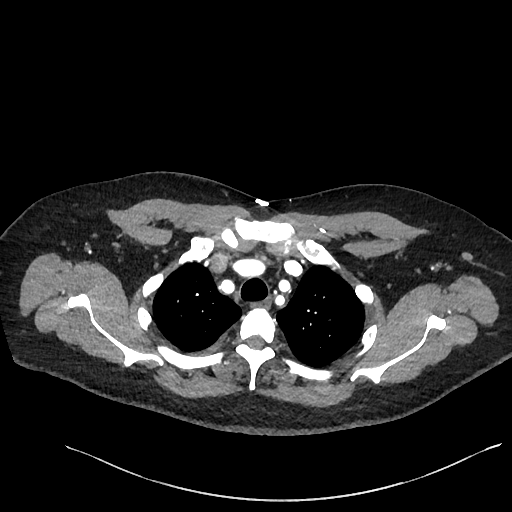
[im 153/167  lung]
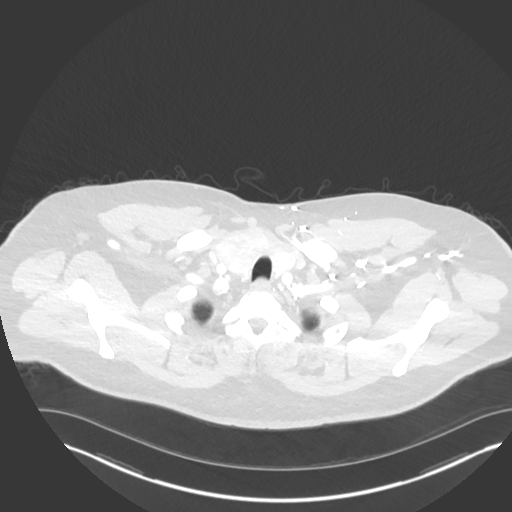

[Series 7: lung · axial · 0.87mm/px · z∈[+1330,+1414]mm · 3 of 167 slices shown]
[im 14/167  soft-tissue]
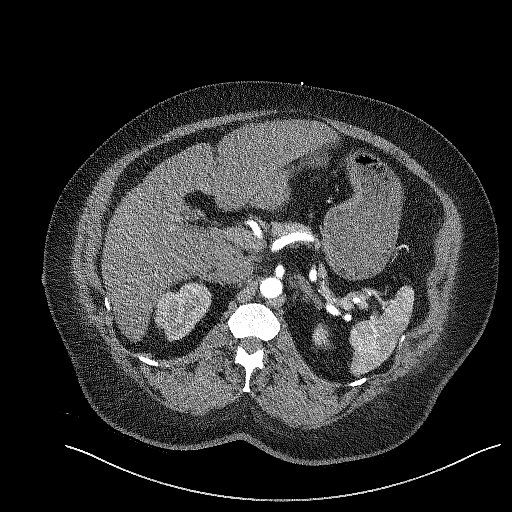
[im 42/167  soft-tissue]
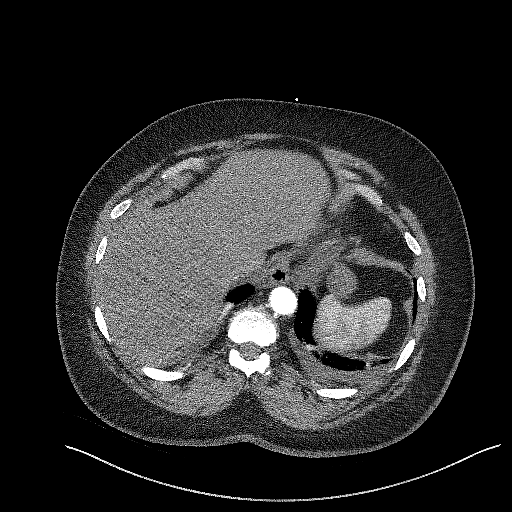
[im 56/167  soft-tissue]
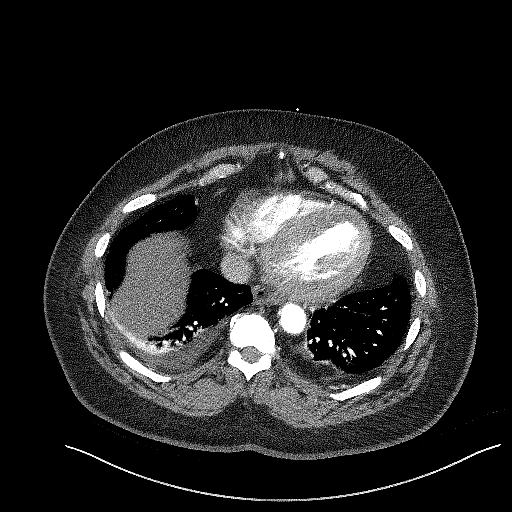

[Series 9: cor · coronal · 0.66mm/px · 3 of 175 slices shown]
[im 44/175  soft-tissue]
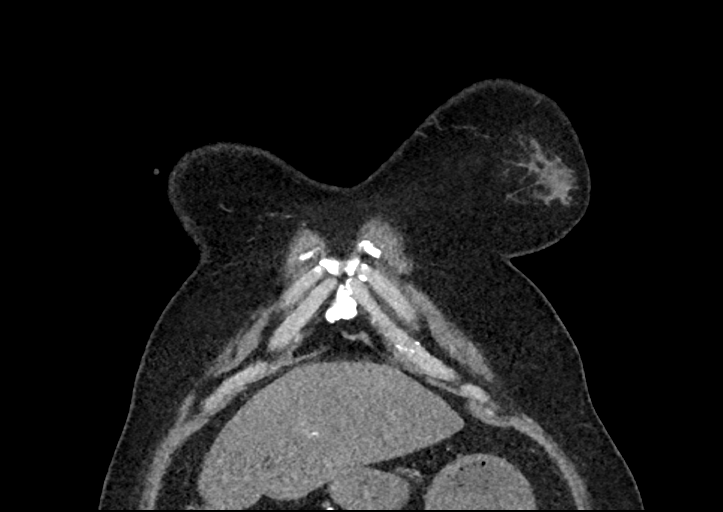
[im 88/175  soft-tissue]
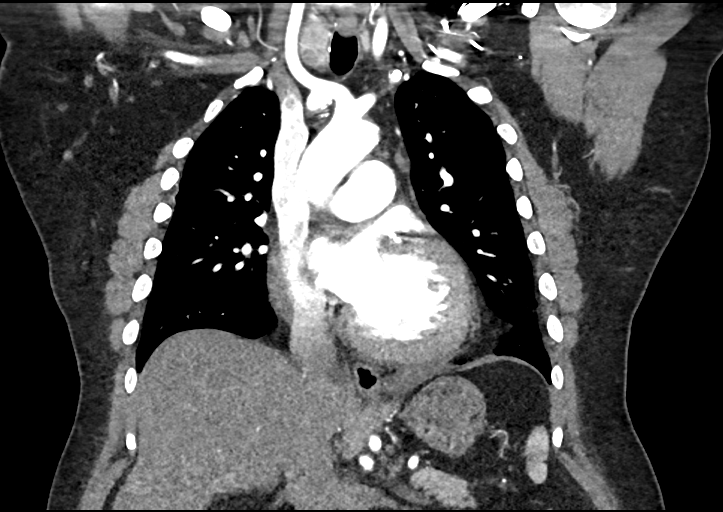
[im 131/175  soft-tissue]
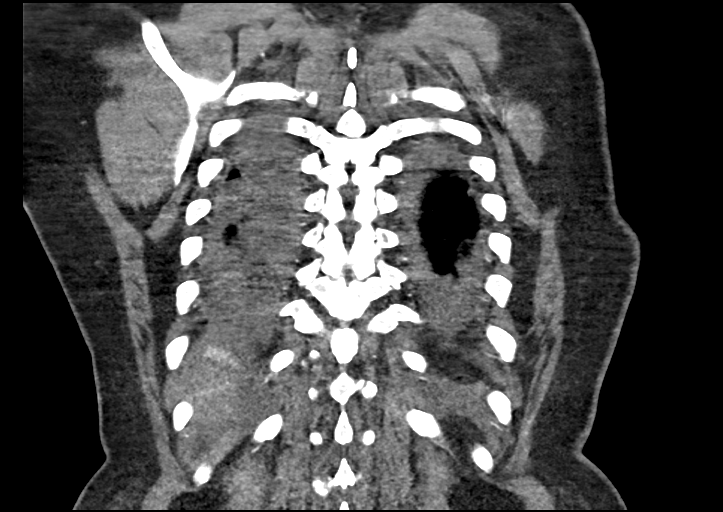

[17 of 46 positions shown; findings below may reference images not displayed]

FINDINGS: Cardiovascular: The heart is unremarkable without pericardial
effusion. Moderate coronary artery atherosclerosis most pronounced
within the LAD and circumflex distributions. No evidence of thoracic
aortic aneurysm or dissection. Mild atherosclerosis of the aortic
arch and great vessels. Common origin of the innominate artery and
left common carotid artery off the aortic arch. Direct origin of the
left vertebral artery from the aortic arch.

No pulmonary emboli or filling defects.

Mediastinum/Nodes: Stable heterogeneous enlargement of thyroid
compatible with goiter. Given stability since [PJ], this can be
considered benign. Trachea and esophagus are unremarkable.
Borderline enlarged mediastinal lymph nodes are seen measuring up to
13 mm in the precarinal region, nonspecific. These are likely
reactive.

Lungs/Pleura: There are trace bilateral pleural effusions. No
airspace disease or pneumothorax. Scattered hypoventilatory changes
are seen at the lung bases. Central airways are patent.

Upper Abdomen: No acute abnormality.

Musculoskeletal: No acute or destructive bony lesions. Reconstructed
images demonstrate no additional findings.

Review of the MIP images confirms the above findings.
IMPRESSION: 1. No evidence of pulmonary embolus.
2. No evidence of thoracic aortic aneurysm or dissection.
3. Trace bilateral pleural effusions.  No acute airspace disease.
4. Borderline enlarged mediastinal lymph nodes, likely reactive.
5. Aortic Atherosclerosis ([PJ]-[PJ]). Coronary artery
atherosclerosis.

## 2020-12-30 MED ORDER — IOHEXOL 350 MG/ML SOLN
80.0000 mL | Freq: Once | INTRAVENOUS | Status: AC | PRN
  Administered 2020-12-30: 80 mL via INTRAVENOUS

## 2020-12-30 MED ORDER — ISOSORBIDE MONONITRATE ER 60 MG PO TB24
60.0000 mg | ORAL_TABLET | Freq: Every day | ORAL | Status: DC
  Administered 2020-12-30 – 2021-01-01 (×3): 60 mg via ORAL
  Filled 2020-12-30 (×3): qty 1

## 2020-12-30 MED ORDER — AMLODIPINE BESYLATE 5 MG PO TABS
5.0000 mg | ORAL_TABLET | Freq: Every day | ORAL | Status: DC
  Administered 2020-12-30: 5 mg via ORAL
  Filled 2020-12-30: qty 1

## 2020-12-30 MED ORDER — SODIUM CHLORIDE 0.9 % IV SOLN: INTRAVENOUS | Status: DC

## 2020-12-30 MED ORDER — AMLODIPINE BESYLATE 10 MG PO TABS
10.0000 mg | ORAL_TABLET | Freq: Every day | ORAL | Status: DC
  Administered 2020-12-31 – 2021-01-01 (×2): 10 mg via ORAL
  Filled 2020-12-30 (×2): qty 1

## 2020-12-30 NOTE — Plan of Care (Signed)

## 2020-12-30 NOTE — Progress Notes (Signed)
Progress Note    [name]  GYJ:[id number] DOB: Jul 06, 1960  DOA: 12/24/2020 PCP: Patient, No Pcp Per (Inactive)    Brief Narrative:     Medical records reviewed and are as summarized below:  [name] is an 60 y.o. male who presented with complaints of midsternal chest pain/achiness that was accompanied by dyspnea. He stated that it was similar to the feeling he had when he had an MI.  S/P cath and CABG was recommended but patient found to have CVAs.     Assessment/Plan:   Active Problems:   Chest pain   AKI (acute kidney injury) ([place])   Cerebral embolism with cerebral infarction   CVA -only complaint is speech difficulty -MRI:  1.Large acute infarct of the superior right cerebellum with mild surrounding edema and petechial hemorrhage. 2. Multiple scattered punctate acute infarcts in multiple vascular territories, consistent with a central cardiac or aortic embolic source. 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 -neurology consult appreciated -PT/OT-no follow up -statin -ASA/plavix x 3 months  NSTEMI     - Cath showed 90% prox-mid RCA, 70-80% RV marginal branches and mild distal RCA disease. Unable to engage left system on initial cath and therefore repeat cath performed 11/16 which showed severe multivessel CAD and was recommended for CABG but plans on hold due to CVA -wean off nitro -outpatient plan for CABG once recovered   HTN urgency      - coreg 25 mg BID  -norvsac added  -imdur added             - per cards   AKI? Vs CKD     - renal US unremarkable     - hold lisinopril; watch nephrotoxins     - gentle IVF prior to CT scan of aorta   DM2     -A1C is 9/9-- improved from 5 years ago -goal of <7   HLD/CAD     - ASA, statin, BB   Tobacco abuse/EtOH abuse/Marijuana abuse     - counseled against further use     - no sign of withdrawal  obesity Body mass index is 34.4 kg/m.  Enlarged right thyroid lobe with multiple hypoattenuating nodules. This  has not previously been evaluated, an ultrasound of the thyroid is recommended  Family Communication/Anticipated D/C date and plan/Code Status    Code Status: Full Code.  Disposition Plan: Status is: Inpatient  Remains inpatient appropriate because: needs better BP control         Medical Consultants:   [facility]      Subjective:   No complaints, no SOB, had BM today  Objective:    Vitals:   12/30/20 0000 12/30/20 0333 12/30/20 0754 12/30/20 1117  BP: (!) 155/96 (!) 170/96 (!) 187/115 (!) 181/110  Pulse: 70  79 72  Resp: 19 19 18 19   Temp: 98.5 F (36.9 C) 97.6 F (36.4 C) 98.6 F (37 C) 98.6 F (37 C)  TempSrc: Oral Oral Oral Oral  SpO2: 95% 94% 95% 92%  Weight:  101.1 kg    Height:        Intake/Output Summary (Last 24 hours) at 12/30/2020 1254 Last data filed at 12/30/2020 1250 Gross per 24 hour  Intake 534 ml  Output 400 ml  Net 134 ml   Filed Weights   12/28/20 0525 12/29/20 0441 12/30/20 0333  Weight: 101.4 kg 101.3 kg 101.1 kg    Exam:   General: Appearance:    Obese male in no acute  distress     Lungs:     respirations unlabored  Heart:    Normal heart rate.     MS:   All extremities are intact.    Neurologic:   Awake, alert, pleasant and cooperative            Data Reviewed:   I have personally reviewed following labs and imaging studies:  Labs: Labs show the following:   Basic Metabolic Panel: Recent Labs  Lab 12/25/20 1046 12/26/20 0232 12/27/20 0143 12/29/20 0231 12/30/20 0805  NA 136 133* 136 136 136  K 3.5 3.8 3.9 3.5 3.5  CL 101 101 99 104 106  CO2 30 26 27 24 24   GLUCOSE 164* 248* 168* 176* 120*  BUN 16 14 11 16 14   CREATININE 1.77* 1.63* 1.52* 1.72* 1.78*  CALCIUM 8.8* 8.2* 8.6* 8.8* 8.7*   GFR Estimated Creatinine Clearance: 50.4 mL/min (A) (by C-G formula based on SCr of 1.78 mg/dL (H)). Liver Function Tests: Recent Labs  Lab 12/24/20 0651 12/25/20 0031  AST 32 22  ALT 42 32  ALKPHOS  131* 95  BILITOT 0.5 0.8  PROT 7.4 6.3*  ALBUMIN 3.9 3.2*   No results for input(s): LIPASE, AMYLASE in the last 168 hours. No results for input(s): AMMONIA in the last 168 hours. Coagulation profile No results for input(s): INR, PROTIME in the last 168 hours.  CBC: Recent Labs  Lab 12/24/20 0651 12/25/20 0031 12/26/20 0232 12/27/20 0143 12/28/20 0216 12/29/20 0231 12/30/20 0247  WBC 10.8*   < > 9.0 10.1 10.0 9.7 10.3  NEUTROABS 5.7  --   --   --   --   --   --   HGB 16.2   < > 12.8* 13.3 12.8* 12.8* 13.6  HCT 46.4   < > 37.4* 40.2 37.9* 37.8* 40.6  MCV 90.8   < > 92.6 93.3 92.4 92.4 92.5  PLT 242   < > 210 224 226 296 237   < > = values in this interval not displayed.   Cardiac Enzymes: No results for input(s): CKTOTAL, CKMB, CKMBINDEX, TROPONINI in the last 168 hours. BNP (last 3 results) No results for input(s): PROBNP in the last 8760 hours. CBG: Recent Labs  Lab 12/29/20 1140 12/29/20 1657 12/29/20 2100 12/30/20 0558 12/30/20 1118  GLUCAP 196* 163* 218* 122* 137*   D-Dimer: No results for input(s): DDIMER in the last 72 hours. Hgb A1c: No results for input(s): HGBA1C in the last 72 hours.  Lipid Profile: No results for input(s): CHOL, HDL, LDLCALC, TRIG, CHOLHDL, LDLDIRECT in the last 72 hours.  Thyroid function studies: No results for input(s): TSH, T4TOTAL, T3FREE, THYROIDAB in the last 72 hours.  Invalid input(s): FREET3 Anemia work up: No results for input(s): VITAMINB12, FOLATE, FERRITIN, TIBC, IRON, RETICCTPCT in the last 72 hours. Sepsis Labs: Recent Labs  Lab 12/27/20 0143 12/28/20 0216 12/29/20 0231 12/30/20 0247  WBC 10.1 10.0 9.7 10.3    Microbiology Recent Results (from the past 240 hour(s))  Resp Panel by RT-PCR (Flu A&B, Covid) Nasopharyngeal Swab     Status: None   Collection Time: 12/24/20 12:57 PM   Specimen: Nasopharyngeal Swab; Nasopharyngeal(NP) swabs in vial transport medium  Result Value Ref Range Status   SARS  Coronavirus 2 by RT PCR NEGATIVE NEGATIVE Final    Comment: (NOTE) SARS-CoV-2 target nucleic acids are NOT DETECTED.  The SARS-CoV-2 RNA is generally detectable in upper respiratory specimens during the acute phase of infection. The lowest concentration  of SARS-CoV-2 viral copies this assay can detect is 138 copies/mL. A negative result does not preclude SARS-Cov-2 infection and should not be used as the sole basis for treatment or other patient management decisions. A negative result may occur with  improper specimen collection/handling, submission of specimen other than nasopharyngeal swab, presence of viral mutation(s) within the areas targeted by this assay, and inadequate number of viral copies(<138 copies/mL). A negative result must be combined with clinical observations, patient history, and epidemiological information. The expected result is Negative.  Fact Sheet for Patients:  EntrepreneurPulse.com.au  Fact Sheet for Healthcare Providers:  IncredibleEmployment.be  This test is no t yet approved or cleared by the [place] FDA and  has been authorized for detection and/or diagnosis of SARS-CoV-2 by FDA under an Emergency Use Authorization (EUA). This EUA will remain  in effect (meaning this test can be used) for the duration of the COVID-19 declaration under Section 564(b)(1) of the Act, 21 U.S.C.section 360bbb-3(b)(1), unless the authorization is terminated  or revoked sooner.       Influenza A by PCR NEGATIVE NEGATIVE Final   Influenza B by PCR NEGATIVE NEGATIVE Final    Comment: (NOTE) The Xpert Xpress SARS-CoV-2/FLU/RSV plus assay is intended as an aid in the diagnosis of influenza from Nasopharyngeal swab specimens and should not be used as a sole basis for treatment. Nasal washings and aspirates are unacceptable for Xpert Xpress SARS-CoV-2/FLU/RSV testing.  Fact Sheet for  Patients: EntrepreneurPulse.com.au  Fact Sheet for Healthcare Providers: IncredibleEmployment.be  This test is not yet approved or cleared by the [place] FDA and has been authorized for detection and/or diagnosis of SARS-CoV-2 by FDA under an Emergency Use Authorization (EUA). This EUA will remain in effect (meaning this test can be used) for the duration of the COVID-19 declaration under Section 564(b)(1) of the Act, 21 U.S.C. section 360bbb-3(b)(1), unless the authorization is terminated or revoked.  Performed at [facility], [place] [address], [place],  [address]     Procedures and diagnostic studies:  ECHOCARDIOGRAM LIMITED BUBBLE STUDY  Result Date: 12/28/2020    ECHOCARDIOGRAM LIMITED REPORT   Patient Name:   JARED WHORLEY Date of Exam: 12/28/2020 Medical Rec #:  [id number]  Height:       67.5 in Accession #:    [id number] Weight:       223.5 lb Date of Birth:  28-Oct-1960  BSA:          2.132 m Patient Age:    17 years   BP:           138/88 mmHg Patient Gender: M          HR:           73 bpm. Exam Location:  Inpatient Procedure: Saline Contrast Bubble Study and 2D Echo Indications:    Stroke 434.91 / I63.9  History:        Patient has prior history of Echocardiogram examinations, most                 recent 12/24/2020. CAD; Risk Factors:Hypertension, Diabetes and                 Dyslipidemia.  Sonographer:    Darlina Sicilian RDCS Referring Phys: [id number] [place]  1. Agitated saline contrast bubble study was negative, with no evidence of any interatrial shunt. FINDINGS  IAS/Shunts: Agitated saline contrast was given intravenously to evaluate for intracardiac shunting. Agitated saline contrast bubble study was negative,  with no evidence of any interatrial shunt. There is no evidence of a patent foramen ovale. There is no evidence of an atrial septal defect. Skeet Latch MD Electronically signed by  Skeet Latch MD Signature Date/Time: 12/28/2020/6:32:55 PM    Final    VAS US DOPPLER PRE CABG  Result Date: 12/29/2020 PREOPERATIVE VASCULAR EVALUATION Patient Name:  [name]  Date of Exam:   12/28/2020 Medical Rec #: [id number]   Accession #:    [id number] Date of Birth: 05-Aug-1960   Patient Gender: M Patient Age:   70 years Exam Location:  [facility] Procedure:      VAS US DOPPLER PRE CABG Referring Phys: Gilford Raid --------------------------------------------------------------------------------  Indications:      Pre-CABG. Risk Factors:     Hypertension, Diabetes, coronary artery disease. Comparison Study: no prior Performing Technologist: Archie Patten RVS  Examination Guidelines: A complete evaluation includes B-mode imaging, spectral Doppler, color Doppler, and power Doppler as needed of all accessible portions of each vessel. Bilateral testing is considered an integral part of a complete examination. Limited examinations for reoccurring indications may be performed as noted.  Right Carotid Findings: +----------+--------+--------+--------+------------+--------+           PSV cm/sEDV cm/sStenosisDescribe    Comments +----------+--------+--------+--------+------------+--------+ CCA Prox  66      9               heterogenous         +----------+--------+--------+--------+------------+--------+ CCA Distal73      14              heterogenous         +----------+--------+--------+--------+------------+--------+ ICA Prox  60      20      1-39%   heterogenous         +----------+--------+--------+--------+------------+--------+ ICA Distal46      21                                   +----------+--------+--------+--------+------------+--------+ ECA       128                                          +----------+--------+--------+--------+------------+--------+ +----------+--------+-------+--------+------------+           PSV cm/sEDV cmsDescribeArm  Pressure +----------+--------+-------+--------+------------+ Subclavian135                                 +----------+--------+-------+--------+------------+ +---------+--------+--+--------+--+---------+ VertebralPSV cm/s37EDV cm/s17Antegrade +---------+--------+--+--------+--+---------+ Left Carotid Findings: +----------+--------+--------+--------+------------+--------+           PSV cm/sEDV cm/sStenosisDescribe    Comments +----------+--------+--------+--------+------------+--------+ CCA Prox  102     20              heterogenous         +----------+--------+--------+--------+------------+--------+ CCA Distal75      12              heterogenous         +----------+--------+--------+--------+------------+--------+ ICA Prox  62      18      1-39%   heterogenous         +----------+--------+--------+--------+------------+--------+ ICA Distal54      26                                   +----------+--------+--------+--------+------------+--------+  ECA       118     4                                    +----------+--------+--------+--------+------------+--------+ +----------+--------+--------+--------+------------+ SubclavianPSV cm/sEDV cm/sDescribeArm Pressure +----------+--------+--------+--------+------------+           46                                   +----------+--------+--------+--------+------------+ +---------+--------+--------+--------------+ VertebralPSV cm/sEDV cm/sNot identified +---------+--------+--------+--------------+  ABI Findings: +--------+------------------+-----+---------+--------+ Right   Rt Pressure (mmHg)IndexWaveform Comment  +--------+------------------+-----+---------+--------+ [id number]                    triphasic         +--------+------------------+-----+---------+--------+ PTA     175               1.30 triphasic         +--------+------------------+-----+---------+--------+ DP      156                1.16 triphasic         +--------+------------------+-----+---------+--------+ +--------+------------------+-----+---------+-------+ Left    Lt Pressure (mmHg)IndexWaveform Comment +--------+------------------+-----+---------+-------+ Brachial                       triphasiciv      +--------+------------------+-----+---------+-------+ PTA     135               1.00 triphasic        +--------+------------------+-----+---------+-------+ DP      135               1.00 triphasic        +--------+------------------+-----+---------+-------+ +-------+---------------+----------------+ ABI/TBIToday's ABI/TBIPrevious ABI/TBI +-------+---------------+----------------+ Right  1.30                            +-------+---------------+----------------+ Left   1.00                            +-------+---------------+----------------+  Right Doppler Findings: +--------+--------+-----+---------+--------+ Site    PressureIndexDoppler  Comments +--------+--------+-----+---------+--------+ [id number]          triphasic         +--------+--------+-----+---------+--------+ Radial               triphasic         +--------+--------+-----+---------+--------+ Ulnar                triphasic         +--------+--------+-----+---------+--------+  Left Doppler Findings: +--------+--------+-----+---------+--------+ Site    PressureIndexDoppler  Comments +--------+--------+-----+---------+--------+ Brachial             triphasiciv       +--------+--------+-----+---------+--------+ Radial               triphasic         +--------+--------+-----+---------+--------+ Ulnar                triphasic         +--------+--------+-----+---------+--------+  Summary: Right Carotid: Velocities in the right ICA are consistent with a 1-39% stenosis. Left Carotid: Velocities in the left ICA are consistent with a 1-39% stenosis. Vertebrals: Right vertebral artery  demonstrates antegrade flow. Left vertebral  artery was not visualized. Right ABI: Resting right ankle-brachial index indicates noncompressible right lower extremity arteries. Left ABI: Resting left ankle-brachial index is within normal range. No evidence of significant left lower extremity arterial disease. Right Upper Extremity: Doppler waveforms remain within normal limits with right radial compression. Doppler waveforms remain within normal limits with right ulnar compression. Left Upper Extremity: Doppler waveforms remain within normal limits with left radial compression. Doppler waveforms remain within normal limits with left ulnar compression.  Electronically signed by Jamelle Haring on 12/29/2020 at 11:09:11 AM.    Final     Medications:     stroke: mapping our early stages of recovery book   Does not apply Once   [START ON 12/31/2020] amLODipine  10 mg Oral Daily   aspirin EC  325 mg Oral Daily   atorvastatin  80 mg Oral Daily   carvedilol  25 mg Oral BID WC   clopidogrel  75 mg Oral Daily   folic acid  1 mg Oral Daily   insulin aspart  0-15 Units Subcutaneous TID WC   insulin aspart  0-5 Units Subcutaneous QHS   insulin glargine-yfgn  15 Units Subcutaneous QHS   isosorbide mononitrate  60 mg Oral Daily   multivitamin with minerals  1 tablet Oral Daily   sodium chloride flush  3 mL Intravenous Q12H   sodium chloride flush  3 mL Intravenous Q12H   sodium chloride flush  3 mL Intravenous Q12H   thiamine  100 mg Oral Daily   Or   thiamine  100 mg Intravenous Daily   Continuous Infusions:  sodium chloride     sodium chloride     sodium chloride       LOS: 6 days   Geradine Girt  [facility]   How to contact the [facility] Attending or Consulting provider 7A - 7P or covering provider during after hours 7P -7A, for this patient?  Check the care team in [facility] and look for a) attending/consulting TRH provider listed and b) the [facility] team listed Log into [email or web address] and  use [place]'s universal password to access. If you do not have the password, please contact the hospital operator. Locate the [facility] provider you are looking for under [facility] and page to a number that you can be directly reached. If you still have difficulty reaching the provider, please page the [facility] (Director on Call) for the Hospitalists listed on amion for assistance.  12/30/2020, 12:54 PM

## 2020-12-30 NOTE — Progress Notes (Signed)
Titrated on Nitro at 6:00am. Patient has not experienced chest pain.   Offered stroke education but patient expressed understanding. Increased to read stroke booklet.

## 2020-12-30 NOTE — Progress Notes (Signed)
Progress Note  Patient Name: [name] Date of Encounter: 12/30/2020  [facility] Cardiologist: Freada Bergeron, MD   Subjective   No complaints this AM  Inpatient Medications    Scheduled Meds:   stroke: mapping our early stages of recovery book   Does not apply Once   amLODipine  5 mg Oral Daily   aspirin EC  325 mg Oral Daily   atorvastatin  80 mg Oral Daily   carvedilol  25 mg Oral BID WC   clopidogrel  75 mg Oral Daily   folic acid  1 mg Oral Daily   insulin aspart  0-15 Units Subcutaneous TID WC   insulin aspart  0-5 Units Subcutaneous QHS   insulin glargine-yfgn  15 Units Subcutaneous QHS   multivitamin with minerals  1 tablet Oral Daily   sodium chloride flush  3 mL Intravenous Q12H   sodium chloride flush  3 mL Intravenous Q12H   sodium chloride flush  3 mL Intravenous Q12H   thiamine  100 mg Oral Daily   Or   thiamine  100 mg Intravenous Daily   Continuous Infusions:  sodium chloride     sodium chloride     nitroGLYCERIN 16.667 mcg/min (12/29/20 1644)   PRN Meds: sodium chloride, sodium chloride, acetaminophen, ondansetron (ZOFRAN) IV, sodium chloride flush, sodium chloride flush   Vital Signs    Vitals:   12/29/20 1946 12/30/20 0000 12/30/20 0333 12/30/20 0754  BP: (!) 178/98 (!) 155/96 (!) 170/96 (!) 187/115  Pulse:  70  79  Resp: 17 19 19 18   Temp: 98 F (36.7 C) 98.5 F (36.9 C) 97.6 F (36.4 C) 98.6 F (37 C)  TempSrc: Oral Oral Oral Oral  SpO2: 96% 95% 94% 95%  Weight:   101.1 kg   Height:        Intake/Output Summary (Last 24 hours) at 12/30/2020 0924 Last data filed at 12/30/2020 2706 Gross per 24 hour  Intake 180 ml  Output 400 ml  Net -220 ml   Last 3 Weights 12/30/2020 12/29/2020 12/28/2020  Weight (lbs) 222 lb 14.4 oz 223 lb 4.8 oz 223 lb 8 oz  Weight (kg) 101.107 kg 101.288 kg 101.379 kg      Telemetry    NSR - Personally Reviewed  ECG    N/a - Personally Reviewed  Physical Exam   GEN: No acute  distress.   Neck: No JVD Cardiac: RRR, no murmurs, rubs, or gallops.  Respiratory: Clear to auscultation bilaterally. GI: Soft, nontender, non-distended  MS: No edema; No deformity. Neuro:  Nonfocal  Psych: Normal affect   Labs    High Sensitivity Troponin:   Recent Labs  Lab 12/24/20 0651 12/24/20 0851 12/24/20 1152 12/24/20 1352  TROPONINIHS 158* 171* 215* 233*     Chemistry Recent Labs  Lab 12/24/20 0651 12/25/20 0031 12/25/20 1046 12/27/20 0143 12/29/20 0231 12/30/20 0805  NA 132* 134*   < > 136 136 136  K 4.0 3.8   < > 3.9 3.5 3.5  CL 96* 99   < > 99 104 106  CO2 27 27   < > 27 24 24   GLUCOSE 432* 279*   < > 168* 176* 120*  BUN 17 20   < > 11 16 14   CREATININE 1.56* 1.83*   < > 1.52* 1.72* 1.78*  CALCIUM 9.3 9.3   < > 8.6* 8.8* 8.7*  PROT 7.4 6.3*  --   --   --   --  ALBUMIN 3.9 3.2*  --   --   --   --   AST 32 22  --   --   --   --   ALT 42 32  --   --   --   --   ALKPHOS 131* 95  --   --   --   --   BILITOT 0.5 0.8  --   --   --   --   GFRNONAA 51* 42*   < > 52* 45* 43*  ANIONGAP 9 8   < > 10 8 6    < > = values in this interval not displayed.    Lipids  Recent Labs  Lab 12/25/20 0031  CHOL 260*  TRIG 235*  HDL 48  LDLCALC 165*  CHOLHDL 5.4    Hematology Recent Labs  Lab 12/28/20 0216 12/29/20 0231 12/30/20 0247  WBC 10.0 9.7 10.3  RBC 4.10* 4.09* 4.39  HGB 12.8* 12.8* 13.6  HCT 37.9* 37.8* 40.6  MCV 92.4 92.4 92.5  MCH 31.2 31.3 31.0  MCHC 33.8 33.9 33.5  RDW 12.3 12.4 12.4  PLT 226 296 237   Thyroid No results for input(s): TSH, FREET4 in the last 168 hours.  BNP Recent Labs  Lab 12/24/20 0651  BNP 412.0*    DDimer No results for input(s): DDIMER in the last 168 hours.   Radiology    CT ANGIO HEAD NECK W WO CM  Result Date: 12/28/2020 CLINICAL DATA:  Neuro deficit, acute, stroke suspected EXAM: CT ANGIOGRAPHY HEAD AND NECK TECHNIQUE: Multidetector CT imaging of the head and neck was performed using the standard protocol  during bolus administration of intravenous contrast. Multiplanar CT image reconstructions and MIPs were obtained to evaluate the vascular anatomy. Carotid stenosis measurements (when applicable) are obtained utilizing NASCET criteria, using the distal internal carotid diameter as the denominator. CONTRAST:  36mL OMNIPAQUE IOHEXOL 350 MG/ML SOLN COMPARISON:  12/27/2020 CT head, 12/28/2020 MRI head. FINDINGS: CT HEAD FINDINGS Brain: Redemonstrated hypodensity in the right cerebellar hemisphere, consistent with the infarct seen on MRI, with new areas of increased density, likely petechial hemorrhage, also consistent with the MRI. No new areas of hypodensity; no CT correlate is seen for other punctate infarcts seen on the MRI. No mass, mass effect, or midline shift Vascular: No hyperdense vessel. Skull: Normal. Negative for fracture or focal lesion. Sinuses: Mucous retention cysts in the left maxillary sinus and left ethmoid air cells. The mastoids are well aerated. Orbits: No acute finding. Review of the MIP images confirms the above findings CTA NECK FINDINGS Aortic arch: 3 vessel arch with a common origin of the brachiocephalic and left common carotid arteries, as well as an aortic origin the left vertebral artery. Imaged portion shows no evidence of aneurysm or dissection. Moderate stenosis of the left vertebral artery origin, secondary to calcified and noncalcified plaque (series 12, image 327). Right carotid system: No evidence of dissection, stenosis (50% or greater) or occlusion. Calcified and noncalcified plaque at the bifurcation, with less than 50% stenosis. Left carotid system: No evidence of dissection, stenosis (50% or greater) or occlusion. Calcified and noncalcified plaque at the bifurcation, with less than 50% stenosis. Vertebral arteries: Moderate stenosis at the origin of the left vertebral artery. The left vertebral artery is otherwise patent through the V3 segment. Mild narrowing at the origin of  the right vertebral artery, which is otherwise patent through the V3 segment. Skeleton: Degenerative changes in the cervical spine. No acute osseous abnormality. Other  neck: Significant enlargement of the right thyroid lobe, with multiple hypoattenuating nodules. Upper chest: Small bilateral pleural effusions with associated atelectasis. Multiple prominent mediastinal lymph nodes (series 12, image 362). Review of the MIP images confirms the above findings CTA HEAD FINDINGS Anterior circulation: Multifocal calcified and noncalcified narrowing of the right intracranial ICA, which is severe in the cavernous (series 12, image 113), and ophthalmic/supraclinoid segments (series 12, image 108). Severe narrowing in the distal left cavernous ICA (series 12, images 109-111) and supraclinoid segments (series 12, image 102. Mild to moderate narrowing of the distal right A1. Normal left A1. Normal anterior communicating artery. There is mild irregularity of the distal ACAs, but they remain patent. Mild irregularity in the M1 segments, which are patent. Normal bifurcations. Distal MCA segments are mildly irregular but perfused. Posterior circulation: Severe narrowing in the left V4, with focal nonopacification (series 12, image 145), with distal reconstitution proximal to the vertebrobasilar junction, possibly retrograde flow. Moderate focal narrowing in the right V4 segment (series 12, images 150-155). Severe narrowing of the basilar artery (series 12, image 122), without complete occlusion. Likely severe focal narrowing at the origin of the right superior cerebellar artery. The origin of the left superior cerebellar artery appears patent. The right posterior communicating artery is patent, with moderate focal stenosis near the origin (series 12, image 106). Diminutive right P1. The left posterior is patent. Moderate stenosis at the origin of the left P1. Diminutive and somewhat irregular P2 and P3 segments bilaterally. Venous  sinuses: As permitted by contrast timing, patent. Anatomic variants: None significant Review of the MIP images confirms the above findings IMPRESSION: 1. Redemonstrated hypodensity in the right cerebellar hemisphere, consistent with the infarct seen on MRI, with petechial hemorrhage. No new infarct is seen. 2. 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 3. Focal narrowing at the origin of the right superior cerebellar artery. 4. Severe narrowing in the left V4 segment, with focal occlusion and distal reconstitution, likely retrograde. Moderate focal narrowing is also seen in the right V4 segment. 5. Multifocal narrowing of the bilateral intracranial internal carotid arteries, which is severe in the cavernous, ophthalmic, and supraclinoid segments, as described above. 6. Moderate stenosis at the origin of the left vertebral artery, which originates from the aorta. Mild narrowing at the origin of the right vertebral artery. No other hemodynamically significant stenosis in the neck. 7. Small bilateral pleural effusions and prominent mediastinal lymph nodes. Consider CT of the chest for further evaluation. 8. Enlarged right thyroid lobe with multiple hypoattenuating nodules. This has not previously been evaluated, an ultrasound of the thyroid is recommended. Electronically Signed   By: Merilyn Baba M.D.   On: 12/28/2020 13:08   ECHOCARDIOGRAM LIMITED BUBBLE STUDY  Result Date: 12/28/2020    ECHOCARDIOGRAM LIMITED REPORT   Patient Name:   [name] Date of Exam: 12/28/2020 Medical Rec #:  [id number]  Height:       67.5 in Accession #:    [id number] Weight:       223.5 lb Date of Birth:  1960-09-13  BSA:          2.132 m Patient Age:    28 years   BP:           138/88 mmHg Patient Gender: M          HR:           73 bpm. Exam Location:  Inpatient Procedure: Saline Contrast Bubble Study and 2D Echo Indications:  Stroke 434.91 / I63.9  History:        Patient has prior history of  Echocardiogram examinations, most                 recent 12/24/2020. CAD; Risk Factors:Hypertension, Diabetes and                 Dyslipidemia.  Sonographer:    Darlina Sicilian RDCS Referring Phys: [id number] [place]  1. Agitated saline contrast bubble study was negative, with no evidence of any interatrial shunt. FINDINGS  IAS/Shunts: Agitated saline contrast was given intravenously to evaluate for intracardiac shunting. Agitated saline contrast bubble study was negative, with no evidence of any interatrial shunt. There is no evidence of a patent foramen ovale. There is no evidence of an atrial septal defect. Skeet Latch MD Electronically signed by Skeet Latch MD Signature Date/Time: 12/28/2020/6:32:55 PM    Final    VAS US DOPPLER PRE CABG  Result Date: 12/29/2020 PREOPERATIVE VASCULAR EVALUATION Patient Name:  [name]  Date of Exam:   12/28/2020 Medical Rec #: [id number]   Accession #:    [id number] Date of Birth: 06-May-1960   Patient Gender: M Patient Age:   18 years Exam Location:  [facility] Procedure:      VAS US DOPPLER PRE CABG Referring Phys: Gilford Raid --------------------------------------------------------------------------------  Indications:      Pre-CABG. Risk Factors:     Hypertension, Diabetes, coronary artery disease. Comparison Study: no prior Performing Technologist: Archie Patten RVS  Examination Guidelines: A complete evaluation includes B-mode imaging, spectral Doppler, color Doppler, and power Doppler as needed of all accessible portions of each vessel. Bilateral testing is considered an integral part of a complete examination. Limited examinations for reoccurring indications may be performed as noted.  Right Carotid Findings: +----------+--------+--------+--------+------------+--------+           PSV cm/sEDV cm/sStenosisDescribe    Comments +----------+--------+--------+--------+------------+--------+ CCA Prox  66      9                heterogenous         +----------+--------+--------+--------+------------+--------+ CCA Distal73      14              heterogenous         +----------+--------+--------+--------+------------+--------+ ICA Prox  60      20      1-39%   heterogenous         +----------+--------+--------+--------+------------+--------+ ICA Distal46      21                                   +----------+--------+--------+--------+------------+--------+ ECA       128                                          +----------+--------+--------+--------+------------+--------+ +----------+--------+-------+--------+------------+           PSV cm/sEDV cmsDescribeArm Pressure +----------+--------+-------+--------+------------+ Subclavian135                                 +----------+--------+-------+--------+------------+ +---------+--------+--+--------+--+---------+ VertebralPSV cm/s37EDV cm/s17Antegrade +---------+--------+--+--------+--+---------+ Left Carotid Findings: +----------+--------+--------+--------+------------+--------+           PSV cm/sEDV cm/sStenosisDescribe    Comments +----------+--------+--------+--------+------------+--------+ CCA Prox  102  20              heterogenous         +----------+--------+--------+--------+------------+--------+ CCA Distal75      12              heterogenous         +----------+--------+--------+--------+------------+--------+ ICA Prox  62      18      1-39%   heterogenous         +----------+--------+--------+--------+------------+--------+ ICA Distal54      26                                   +----------+--------+--------+--------+------------+--------+ ECA       118     4                                    +----------+--------+--------+--------+------------+--------+ +----------+--------+--------+--------+------------+ SubclavianPSV cm/sEDV cm/sDescribeArm Pressure  +----------+--------+--------+--------+------------+           46                                   +----------+--------+--------+--------+------------+ +---------+--------+--------+--------------+ VertebralPSV cm/sEDV cm/sNot identified +---------+--------+--------+--------------+  ABI Findings: +--------+------------------+-----+---------+--------+ Right   Rt Pressure (mmHg)IndexWaveform Comment  +--------+------------------+-----+---------+--------+ [id number]                    triphasic         +--------+------------------+-----+---------+--------+ PTA     175               1.30 triphasic         +--------+------------------+-----+---------+--------+ DP      156               1.16 triphasic         +--------+------------------+-----+---------+--------+ +--------+------------------+-----+---------+-------+ Left    Lt Pressure (mmHg)IndexWaveform Comment +--------+------------------+-----+---------+-------+ Brachial                       triphasiciv      +--------+------------------+-----+---------+-------+ PTA     135               1.00 triphasic        +--------+------------------+-----+---------+-------+ DP      135               1.00 triphasic        +--------+------------------+-----+---------+-------+ +-------+---------------+----------------+ ABI/TBIToday's ABI/TBIPrevious ABI/TBI +-------+---------------+----------------+ Right  1.30                            +-------+---------------+----------------+ Left   1.00                            +-------+---------------+----------------+  Right Doppler Findings: +--------+--------+-----+---------+--------+ Site    PressureIndexDoppler  Comments +--------+--------+-----+---------+--------+ [id number]          triphasic         +--------+--------+-----+---------+--------+ Radial               triphasic         +--------+--------+-----+---------+--------+ Ulnar                 triphasic         +--------+--------+-----+---------+--------+  Left Doppler Findings: +--------+--------+-----+---------+--------+ Site  PressureIndexDoppler  Comments +--------+--------+-----+---------+--------+ Brachial             triphasiciv       +--------+--------+-----+---------+--------+ Radial               triphasic         +--------+--------+-----+---------+--------+ Ulnar                triphasic         +--------+--------+-----+---------+--------+  Summary: Right Carotid: Velocities in the right ICA are consistent with a 1-39% stenosis. Left Carotid: Velocities in the left ICA are consistent with a 1-39% stenosis. Vertebrals: Right vertebral artery demonstrates antegrade flow. Left vertebral             artery was not visualized. Right ABI: Resting right ankle-brachial index indicates noncompressible right lower extremity arteries. Left ABI: Resting left ankle-brachial index is within normal range. No evidence of significant left lower extremity arterial disease. Right Upper Extremity: Doppler waveforms remain within normal limits with right radial compression. Doppler waveforms remain within normal limits with right ulnar compression. Left Upper Extremity: Doppler waveforms remain within normal limits with left radial compression. Doppler waveforms remain within normal limits with left ulnar compression.  Electronically signed by Jamelle Haring on 12/29/2020 at 11:09:11 AM.    Final     Cardiac Studies     Patient Profile     60 y.o. 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 Course complicated by speech changes found to have large acute infarct of the superior right cerebellum with petechial hemorrhage and multiple scattered  infarcts in multiple territories consistent with embolic stroke.  Assessment & Plan    1.NSTEMI - history of multiple prior PCIs - admitted with NSTEMI - Cath showed 90% prox-mid RCA, 70-80% RV marginal branches and mild distal RCA disease. Unable to engage left system on initial cath and therefore repeat cath performed 11/16 which showed severe multivessel CAD now recommended for CABG - 12/2020 echo LVEF 55-60%, grade I dd, The basal inferior segment is hypokinetic  - after 2nd cath patient with acute CVA. Have deferred on CT srugery consultation  - medical therapy with ASA 325(high dose given CVA), atorva 80, coreg 25mg  bid, plavix 75, nitro gtt. No ACE/ARB due to renal dysfuncntion  - difficulty weaning NG drip due to ongoing chest pain. Start imdur 60mg  daily, drip turned off this AM, will monitor for recurrent symptoms.  - CT surgery saw 12/27/2020, would clarify with Monday rounding team there plans as far as timing for CABG  now that neuro workup is completed.   2. Acute CVA - occurred in hops[place] - management per neuro, primary team  3. HTN -initial permissive HTN post CVA - norvasc staring back today, he is on coreg 25mg  bid   For questions or updates, please contact [place] Please consult [email or web address] for contact info under        Signed, Carlyle Dolly, MD  12/30/2020, 9:24 AM

## 2020-12-31 ENCOUNTER — Encounter (HOSPITAL_COMMUNITY)
Admission: EM | Disposition: A | Discharge status: Home / Self Care | Payer: Self-pay | Source: Home / Self Care | Attending: Internal Medicine

## 2020-12-31 ENCOUNTER — Inpatient Hospital Stay: Payer: No Typology Code available for payment source | Admitting: Critical Care Medicine

## 2020-12-31 DIAGNOSIS — E1169 Type 2 diabetes mellitus with other specified complication: Secondary | ICD-10-CM | POA: Diagnosis not present

## 2020-12-31 DIAGNOSIS — N179 Acute kidney failure, unspecified: Secondary | ICD-10-CM | POA: Diagnosis not present

## 2020-12-31 DIAGNOSIS — I10 Essential (primary) hypertension: Secondary | ICD-10-CM | POA: Diagnosis not present

## 2020-12-31 DIAGNOSIS — E7800 Pure hypercholesterolemia, unspecified: Secondary | ICD-10-CM

## 2020-12-31 DIAGNOSIS — E669 Obesity, unspecified: Secondary | ICD-10-CM | POA: Diagnosis not present

## 2020-12-31 DIAGNOSIS — R079 Chest pain, unspecified: Secondary | ICD-10-CM | POA: Diagnosis not present

## 2020-12-31 DIAGNOSIS — I6340 Cerebral infarction due to embolism of unspecified cerebral artery: Secondary | ICD-10-CM | POA: Diagnosis not present

## 2020-12-31 DIAGNOSIS — I214 Non-ST elevation (NSTEMI) myocardial infarction: Secondary | ICD-10-CM | POA: Diagnosis not present

## 2020-12-31 LAB — CBC
HCT: 39.9 % (ref 39.0–52.0)
Hemoglobin: 13.4 g/dL (ref 13.0–17.0)
MCH: 31.1 pg (ref 26.0–34.0)
MCHC: 33.6 g/dL (ref 30.0–36.0)
MCV: 92.6 fL (ref 80.0–100.0)
Platelets: 248 10*3/uL (ref 150–400)
RBC: 4.31 MIL/uL (ref 4.22–5.81)
RDW: 12.4 % (ref 11.5–15.5)
WBC: 11.1 10*3/uL — ABNORMAL HIGH (ref 4.0–10.5)
nRBC: 0 % (ref 0.0–0.2)

## 2020-12-31 LAB — BASIC METABOLIC PANEL
Anion gap: 6 (ref 5–15)
BUN: 18 mg/dL (ref 6–20)
CO2: 25 mmol/L (ref 22–32)
Calcium: 8.5 mg/dL — ABNORMAL LOW (ref 8.9–10.3)
Chloride: 106 mmol/L (ref 98–111)
Creatinine, Ser: 1.6 mg/dL — ABNORMAL HIGH (ref 0.61–1.24)
GFR, Estimated: 49 mL/min — ABNORMAL LOW (ref >60)
Glucose, Bld: 183 mg/dL — ABNORMAL HIGH (ref 70–99)
Potassium: 3.5 mmol/L (ref 3.5–5.1)
Sodium: 137 mmol/L (ref 135–145)

## 2020-12-31 LAB — GLUCOSE, CAPILLARY
Glucose-Capillary: 132 mg/dL — ABNORMAL HIGH (ref 70–99)
Glucose-Capillary: 145 mg/dL — ABNORMAL HIGH (ref 70–99)
Glucose-Capillary: 192 mg/dL — ABNORMAL HIGH (ref 70–99)
Glucose-Capillary: 218 mg/dL — ABNORMAL HIGH (ref 70–99)

## 2020-12-31 LAB — TSH: TSH: 1.356 u[IU]/mL (ref 0.350–4.500)

## 2020-12-31 SURGERY — CORONARY ARTERY BYPASS GRAFTING (CABG)
Anesthesia: General | Site: Chest

## 2020-12-31 MED ORDER — HYDRALAZINE HCL 20 MG/ML IJ SOLN
5.0000 mg | Freq: Three times a day (TID) | INTRAMUSCULAR | Status: DC | PRN
  Administered 2020-12-31 – 2021-01-01 (×2): 5 mg via INTRAVENOUS
  Filled 2020-12-31 (×2): qty 1

## 2020-12-31 MED ORDER — CARVEDILOL 6.25 MG PO TABS
18.7500 mg | ORAL_TABLET | Freq: Two times a day (BID) | ORAL | Status: DC
  Administered 2020-12-31 – 2021-01-01 (×2): 18.75 mg via ORAL
  Filled 2020-12-31 (×2): qty 1

## 2020-12-31 NOTE — Plan of Care (Signed)
Patient progressing 

## 2020-12-31 NOTE — Plan of Care (Signed)
  Problem: Clinical Measurements: Goal: Diagnostic test results will improve Outcome: Progressing   Problem: Coping: Goal: Level of anxiety will decrease Outcome: Progressing   Problem: Safety: Goal: Ability to remain free from injury will improve Outcome: Progressing   Problem: Activity: Goal: Ability to tolerate increased activity will improve Outcome: Progressing

## 2020-12-31 NOTE — Progress Notes (Signed)
Progress Note  Patient Name: [name] Date of Encounter: 12/31/2020  [facility] Cardiologist: Freada Bergeron, MD   Subjective   Feeling well.  Eager to go home.  No chest pain with ambulation.  Inpatient Medications    Scheduled Meds:   stroke: mapping our early stages of recovery book   Does not apply Once   amLODipine  10 mg Oral Daily   aspirin EC  325 mg Oral Daily   atorvastatin  80 mg Oral Daily   carvedilol  25 mg Oral BID WC   clopidogrel  75 mg Oral Daily   folic acid  1 mg Oral Daily   insulin aspart  0-15 Units Subcutaneous TID WC   insulin aspart  0-5 Units Subcutaneous QHS   insulin glargine-yfgn  15 Units Subcutaneous QHS   isosorbide mononitrate  60 mg Oral Daily   multivitamin with minerals  1 tablet Oral Daily   sodium chloride flush  3 mL Intravenous Q12H   sodium chloride flush  3 mL Intravenous Q12H   sodium chloride flush  3 mL Intravenous Q12H   thiamine  100 mg Oral Daily   Or   thiamine  100 mg Intravenous Daily   Continuous Infusions:  sodium chloride     sodium chloride     PRN Meds: sodium chloride, sodium chloride, acetaminophen, ondansetron (ZOFRAN) IV, sodium chloride flush, sodium chloride flush   Vital Signs    Vitals:   12/31/20 0208 12/31/20 0429 12/31/20 0828 12/31/20 0829  BP:  (!) 133/92 (!) 156/83 (!) 156/83  Pulse:  73 77 63  Resp:  19    Temp:  97.7 F (36.5 C)  98.3 F (36.8 C)  TempSrc:  Oral  Oral  SpO2:  (!) 87%  96%  Weight: 100.8 kg     Height:        Intake/Output Summary (Last 24 hours) at 12/31/2020 1015 Last data filed at 12/31/2020 0854 Gross per 24 hour  Intake 1331.65 ml  Output 700 ml  Net 631.65 ml   Last 3 Weights 12/31/2020 12/30/2020 12/29/2020  Weight (lbs) 222 lb 4.8 oz 222 lb 14.4 oz 223 lb 4.8 oz  Weight (kg) 100.835 kg 101.107 kg 101.288 kg      Telemetry    Sinus rhythm.  Sinus bradycardia overnight.  - Personally Reviewed  ECG    N/a - Personally  Reviewed  Physical Exam   GEN: No acute distress.   Neck: No JVD Cardiac: RRR, no murmurs, rubs, or gallops.  Respiratory: Clear to auscultation bilaterally. GI: Soft, nontender, non-distended  MS: No edema; No deformity. Neuro:  Nonfocal  Psych: Normal affect   Labs    High Sensitivity Troponin:   Recent Labs  Lab 12/24/20 0651 12/24/20 0851 12/24/20 1152 12/24/20 1352  TROPONINIHS 158* 171* 215* 233*     Chemistry Recent Labs  Lab 12/25/20 0031 12/25/20 1046 12/29/20 0231 12/30/20 0805 12/31/20 0159  NA 134*   < > 136 136 137  K 3.8   < > 3.5 3.5 3.5  CL 99   < > 104 106 106  CO2 27   < > 24 24 25   GLUCOSE 279*   < > 176* 120* 183*  BUN 20   < > 16 14 18   CREATININE 1.83*   < > 1.72* 1.78* 1.60*  CALCIUM 9.3   < > 8.8* 8.7* 8.5*  PROT 6.3*  --   --   --   --  ALBUMIN 3.2*  --   --   --   --   AST 22  --   --   --   --   ALT 32  --   --   --   --   ALKPHOS 95  --   --   --   --   BILITOT 0.8  --   --   --   --   GFRNONAA 42*   < > 45* 43* 49*  ANIONGAP 8   < > 8 6 6    < > = values in this interval not displayed.    Lipids  Recent Labs  Lab 12/25/20 0031  CHOL 260*  TRIG 235*  HDL 48  LDLCALC 165*  CHOLHDL 5.4    Hematology Recent Labs  Lab 12/29/20 0231 12/30/20 0247 12/31/20 0159  WBC 9.7 10.3 11.1*  RBC 4.09* 4.39 4.31  HGB 12.8* 13.6 13.4  HCT 37.8* 40.6 39.9  MCV 92.4 92.5 92.6  MCH 31.3 31.0 31.1  MCHC 33.9 33.5 33.6  RDW 12.4 12.4 12.4  PLT 296 237 248   Thyroid  Recent Labs  Lab 12/31/20 0159  TSH 1.356    BNPNo results for input(s): BNP, PROBNP in the last 168 hours.  DDimer No results for input(s): DDIMER in the last 168 hours.   Radiology    CT ANGIO CHEST AORTA W/CM & OR WO/CM  Result Date: 12/30/2020 CLINICAL DATA:  Aortic disease, preoperative evaluation, history of stroke EXAM: CT ANGIOGRAPHY CHEST WITH CONTRAST TECHNIQUE: Multidetector CT imaging of the chest was performed using the standard protocol during  bolus administration of intravenous contrast. Multiplanar CT image reconstructions and MIPs were obtained to evaluate the vascular anatomy. CONTRAST:  22mL OMNIPAQUE IOHEXOL 350 MG/ML SOLN COMPARISON:  12/28/2020, 12/24/2020, 03/16/2015 FINDINGS: Cardiovascular: The heart is unremarkable without pericardial effusion. Moderate coronary artery atherosclerosis most pronounced within the LAD and circumflex distributions. No evidence of thoracic aortic aneurysm or dissection. Mild atherosclerosis of the aortic arch and great vessels. Common origin of the innominate artery and left common carotid artery off the aortic arch. Direct origin of the left vertebral artery from the aortic arch. No pulmonary emboli or filling defects. Mediastinum/Nodes: Stable heterogeneous enlargement of thyroid compatible with goiter. Given stability since 2017, this can be considered benign. Trachea and esophagus are unremarkable. Borderline enlarged mediastinal lymph nodes are seen measuring up to 13 mm in the precarinal region, nonspecific. These are likely reactive. Lungs/Pleura: There are trace bilateral pleural effusions. No airspace disease or pneumothorax. Scattered hypoventilatory changes are seen at the lung bases. Central airways are patent. Upper Abdomen: No acute abnormality. Musculoskeletal: No acute or destructive bony lesions. Reconstructed images demonstrate no additional findings. Review of the MIP images confirms the above findings. IMPRESSION: 1. No evidence of pulmonary embolus. 2. No evidence of thoracic aortic aneurysm or dissection. 3. Trace bilateral pleural effusions.  No acute airspace disease. 4. Borderline enlarged mediastinal lymph nodes, likely reactive. 5. Aortic Atherosclerosis (ICD10-I70.0). Coronary artery atherosclerosis. Electronically Signed   By: Randa Ngo M.D.   On: 12/30/2020 21:58    Cardiac Studies   TTE Bubble 12/28/20: FINDINGS   IAS/Shunts: Agitated saline contrast was given intravenously  to evaluate  for intracardiac shunting. Agitated saline contrast bubble study was  negative, with no evidence of any interatrial shunt. There is no evidence  of a patent foramen ovale. There is  no evidence of an atrial septal defect.   LHC 12/26/20:   Prox Cx  lesion is 60% stenosed.   Prox Cx to Mid Cx lesion is 90% stenosed.   1st Mrg-2 lesion is 80% stenosed.   Mid LAD lesion is 50% stenosed.   Mid LAD to Dist LAD lesion is 80% stenosed.   2nd Diag lesion is 80% stenosed.   RPAV lesion is 50% stenosed.   RV Branch-1 lesion is 70% stenosed.   RV Branch-2 lesion is 80% stenosed.   Prox RCA-1 lesion is 90% stenosed.   Prox RCA to Mid RCA lesion is 95% stenosed.   Mid RCA lesion is 20% stenosed.   Prox RCA-2 lesion is 65% stenosed.   Dist RCA lesion is 20% stenosed.   1st Diag lesion is 90% stenosed.   Prox LAD lesion is 30% stenosed.   Previously placed Mid Cx to Dist Cx stent (unknown type) is  widely patent.   Previously placed 1st Mrg-1 stent (unknown type) is  widely patent.   LV end diastolic pressure is severely elevated.   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  In this diabetic male with significant diffuse disease in the LAD, left circumflex, and right coronary artery recommend surgical evaluation for consideration of CABG revascularization.   RECOMMENDATION: Surgical consultation for consideration of CABG.  We will reinitiate heparin later this evening.  Patient continues to be on intravenous nitroglycerin.  Increase medical therapy as blood pressure and heart rate allow.  Aggressive lipid-lowering therapy with target LDL in the 50s or below.  Echo 12/24/20:  1. Left ventricular ejection fraction, by estimation, is 55 to 60%. The  left ventricle has normal function. The left ventricle demonstrates  regional wall motion abnormalities (see scoring  diagram/findings for  description). Left ventricular diastolic  parameters are consistent with Grade I diastolic dysfunction (impaired  relaxation).   2. Right ventricular systolic function is normal. The right ventricular  size is normal.   3. The mitral valve is normal in structure. Mild mitral valve  regurgitation. No evidence of mitral stenosis.   4. The aortic valve is normal in structure. Aortic valve regurgitation is  not visualized. Aortic valve sclerosis is present, with no evidence of  aortic valve stenosis.   5. The inferior vena cava is normal in size with greater than 50%  respiratory variability, suggesting right atrial pressure of 3 mmHg.     Patient Profile     60 y.o. male with multivessel CAD (70% mid RCA, 95% OM1, 90% distal LCx, 90% OM3, 80% prox LAD and 90% ost D1 with normal LV function s/p PCI to LAD, left PLA, OM1), hypertension, hyperlipidemia, CKD, DM and tobacco abuse admitted with NSTEMI and recommended CABG.  Hospitalization complicated by large acute CVA consistent with embolic stroke.   Assessment & Plan    # 3v CAD:  # NSTEMI:  # Hyperlipidemia: Cath showed 90% prox-mid RCA, 70-80% RV marginal branches and mild distal RCA disease. Unable to engage left system on initial cath and therefore repeat cath performed 11/16 which showed severe multivessel CAD now recommended for CABG.  Echo this admission LVEF 55-60% with basal inferior hypokinesis.  Continue medical therapy for now with ASA (325mg  for CVA), carvedilol, clopidogrel, atorvastatin and nitroglycerin.  He is ambulating and has no chest pain.    # Acute CVA:  Planning for 30 day monitor at discharge.   # Hypertension:  Initially had permissive hypertension s/p CVA.  BP is now still  elevated.  Agree with increasing amlodipine to 10mg  this am.  Will reduce carvedilol to 18.75mg  due to nocturnal bradycardia.   # Bradycardia: # Likely OSA: Patient admits to snoring and thinks he has OSA.  Will need  outpatient sleep study.  Reduce carvedilol to 18.75mg .       For questions or updates, please contact [place] Please consult [email or web address] for contact info under        Signed, Skeet Latch, MD  12/31/2020, 10:15 AM

## 2020-12-31 NOTE — Progress Notes (Signed)
Progress Note    [name]  ZOX:[id number] DOB: [birth year]  DOA: 12/24/2020 PCP: Patient, No Pcp Per (Inactive)    Brief Narrative:     Medical records reviewed and are as summarized below:  [name] is an 60 y.o. male who presented with complaints of midsternal chest pain/achiness that was accompanied by dyspnea. He stated that it was similar to the feeling he had when he had an MI.  S/P cath and CABG was recommended but patient found to have CVAs.  Home once BP controlled.  SBP goal is 140   Assessment/Plan:   Active Problems:   Chest pain   AKI (acute kidney injury) ([place])   Cerebral embolism with cerebral infarction   CVA -only complaint is speech difficulty -MRI:  1.Large acute infarct of the superior right cerebellum with mild surrounding edema and petechial hemorrhage. 2. Multiple scattered punctate acute infarcts in multiple vascular territories, consistent with a central cardiac or aortic embolic source. 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 -neurology consult appreciated -PT/OT-no follow up -statin -ASA/plavix x 3 months then ASA alone  NSTEMI     - Cath showed 90% prox-mid RCA, 70-80% RV marginal branches and mild distal RCA disease. Unable to engage left system on initial cath and therefore repeat cath performed 11/16 which showed severe multivessel CAD and was recommended for CABG but plans on hold due to CVA -wean off nitro -outpatient plan for CABG once recovered   HTN urgency      - coreg decreased due to bradycardia  -norvsac added and increased  -imdur added             - per cards   AKI? Vs CKD     - renal US unremarkable     - hold lisinopril; watch nephrotoxins   DM2     -A1C is 9/9-- improved from 5 years ago -goal of <7 -SSI   HLD/CAD     - ASA, statin, BB   Tobacco abuse/EtOH abuse/Marijuana abuse     - counseled against further use     - no sign of withdrawal  Suspected sleep apnea -needs outpatient sleep  study  obesity Body mass index is 34.3 kg/m.  Enlarged right thyroid lobe with multiple hypoattenuating nodules. This has not previously been evaluated, an ultrasound of the thyroid is recommended  Family Communication/Anticipated D/C date and plan/Code Status    Code Status: Full Code.  Disposition Plan: Status is: Inpatient  Remains inpatient appropriate because: needs better BP control         Medical Consultants:   [facility] Neurology      Subjective:   Would like to go home  Objective:    Vitals:   12/31/20 0429 12/31/20 0828 12/31/20 0829 12/31/20 1140  BP: (!) 133/92 (!) 156/83 (!) 156/83 (!) 171/91  Pulse: 73 77 63 60  Resp: 19   19  Temp: 97.7 F (36.5 C)  98.3 F (36.8 C) 98.8 F (37.1 C)  TempSrc: Oral  Oral Oral  SpO2: (!) 87%  96% 95%  Weight:      Height:        Intake/Output Summary (Last 24 hours) at 12/31/2020 1156 Last data filed at 12/31/2020 0854 Gross per 24 hour  Intake 1331.65 ml  Output 700 ml  Net 631.65 ml   Filed Weights   12/29/20 0441 12/30/20 0333 12/31/20 0208  Weight: 101.3 kg 101.1 kg 100.8 kg    Exam:   General: Appearance:  Obese male in no acute distress   Obese abdomen  Lungs:     respirations unlabored  Heart:    Normal heart rate.    MS:   All extremities are intact.    Neurologic:   Awake, alert, oriented x 3. No apparent focal neurological           defect.      Data Reviewed:   I have personally reviewed following labs and imaging studies:  Labs: Labs show the following:   Basic Metabolic Panel: Recent Labs  Lab 12/26/20 0232 12/27/20 0143 12/29/20 0231 12/30/20 0805 12/31/20 0159  NA 133* 136 136 136 137  K 3.8 3.9 3.5 3.5 3.5  CL 101 99 104 106 106  CO2 26 27 24 24 25   GLUCOSE 248* 168* 176* 120* 183*  BUN 14 11 16 14 18   CREATININE 1.63* 1.52* 1.72* 1.78* 1.60*  CALCIUM 8.2* 8.6* 8.8* 8.7* 8.5*   GFR Estimated Creatinine Clearance: 56 mL/min (A) (by C-G formula based on  SCr of 1.6 mg/dL (H)). Liver Function Tests: Recent Labs  Lab 12/25/20 0031  AST 22  ALT 32  ALKPHOS 95  BILITOT 0.8  PROT 6.3*  ALBUMIN 3.2*   No results for input(s): LIPASE, AMYLASE in the last 168 hours. No results for input(s): AMMONIA in the last 168 hours. Coagulation profile No results for input(s): INR, PROTIME in the last 168 hours.  CBC: Recent Labs  Lab 12/27/20 0143 12/28/20 0216 12/29/20 0231 12/30/20 0247 12/31/20 0159  WBC 10.1 10.0 9.7 10.3 11.1*  HGB 13.3 12.8* 12.8* 13.6 13.4  HCT 40.2 37.9* 37.8* 40.6 39.9  MCV 93.3 92.4 92.4 92.5 92.6  PLT 224 226 296 237 248   Cardiac Enzymes: No results for input(s): CKTOTAL, CKMB, CKMBINDEX, TROPONINI in the last 168 hours. BNP (last 3 results) No results for input(s): PROBNP in the last 8760 hours. CBG: Recent Labs  Lab 12/30/20 1118 12/30/20 1641 12/30/20 2100 12/31/20 0549 12/31/20 1140  GLUCAP 137* 150* 164* 132* 145*   D-Dimer: No results for input(s): DDIMER in the last 72 hours. Hgb A1c: No results for input(s): HGBA1C in the last 72 hours.  Lipid Profile: No results for input(s): CHOL, HDL, LDLCALC, TRIG, CHOLHDL, LDLDIRECT in the last 72 hours.  Thyroid function studies: Recent Labs    12/31/20 0159  TSH 1.356   Anemia work up: No results for input(s): VITAMINB12, FOLATE, FERRITIN, TIBC, IRON, RETICCTPCT in the last 72 hours. Sepsis Labs: Recent Labs  Lab 12/28/20 0216 12/29/20 0231 12/30/20 0247 12/31/20 0159  WBC 10.0 9.7 10.3 11.1*    Microbiology Recent Results (from the past 240 hour(s))  Resp Panel by RT-PCR (Flu A&B, Covid) Nasopharyngeal Swab     Status: None   Collection Time: 12/24/20 12:57 PM   Specimen: Nasopharyngeal Swab; Nasopharyngeal(NP) swabs in vial transport medium  Result Value Ref Range Status   SARS Coronavirus 2 by RT PCR NEGATIVE NEGATIVE Final    Comment: (NOTE) SARS-CoV-2 target nucleic acids are NOT DETECTED.  The SARS-CoV-2 RNA is generally  detectable in upper respiratory specimens during the acute phase of infection. The lowest concentration of SARS-CoV-2 viral copies this assay can detect is 138 copies/mL. A negative result does not preclude SARS-Cov-2 infection and should not be used as the sole basis for treatment or other patient management decisions. A negative result may occur with  improper specimen collection/handling, submission of specimen other than nasopharyngeal swab, presence of viral mutation(s) within the areas targeted  by this assay, and inadequate number of viral copies(<138 copies/mL). A negative result must be combined with clinical observations, patient history, and epidemiological information. The expected result is Negative.  Fact Sheet for Patients:  EntrepreneurPulse.com.au  Fact Sheet for Healthcare Providers:  IncredibleEmployment.be  This test is no t yet approved or cleared by the [place] FDA and  has been authorized for detection and/or diagnosis of SARS-CoV-2 by FDA under an Emergency Use Authorization (EUA). This EUA will remain  in effect (meaning this test can be used) for the duration of the COVID-19 declaration under Section 564(b)(1) of the Act, 21 U.S.C.section 360bbb-3(b)(1), unless the authorization is terminated  or revoked sooner.       Influenza A by PCR NEGATIVE NEGATIVE Final   Influenza B by PCR NEGATIVE NEGATIVE Final    Comment: (NOTE) The Xpert Xpress SARS-CoV-2/FLU/RSV plus assay is intended as an aid in the diagnosis of influenza from Nasopharyngeal swab specimens and should not be used as a sole basis for treatment. Nasal washings and aspirates are unacceptable for Xpert Xpress SARS-CoV-2/FLU/RSV testing.  Fact Sheet for Patients: EntrepreneurPulse.com.au  Fact Sheet for Healthcare Providers: IncredibleEmployment.be  This test is not yet approved or cleared by the [place] FDA  and has been authorized for detection and/or diagnosis of SARS-CoV-2 by FDA under an Emergency Use Authorization (EUA). This EUA will remain in effect (meaning this test can be used) for the duration of the COVID-19 declaration under Section 564(b)(1) of the Act, 21 U.S.C. section 360bbb-3(b)(1), unless the authorization is terminated or revoked.  Performed at [facility], [place] [address] [place], [place] [address]     Procedures and diagnostic studies:  CT ANGIO CHEST AORTA W/CM & OR WO/CM  Result Date: 12/30/2020 CLINICAL DATA:  Aortic disease, preoperative evaluation, history of stroke EXAM: CT ANGIOGRAPHY CHEST WITH CONTRAST TECHNIQUE: Multidetector CT imaging of the chest was performed using the standard protocol during bolus administration of intravenous contrast. Multiplanar CT image reconstructions and MIPs were obtained to evaluate the vascular anatomy. CONTRAST:  79mL OMNIPAQUE IOHEXOL 350 MG/ML SOLN COMPARISON:  12/28/2020, 12/24/2020, 03/16/2015 FINDINGS: Cardiovascular: The heart is unremarkable without pericardial effusion. Moderate coronary artery atherosclerosis most pronounced within the LAD and circumflex distributions. No evidence of thoracic aortic aneurysm or dissection. Mild atherosclerosis of the aortic arch and great vessels. Common origin of the innominate artery and left common carotid artery off the aortic arch. Direct origin of the left vertebral artery from the aortic arch. No pulmonary emboli or filling defects. Mediastinum/Nodes: Stable heterogeneous enlargement of thyroid compatible with goiter. Given stability since 2017, this can be considered benign. Trachea and esophagus are unremarkable. Borderline enlarged mediastinal lymph nodes are seen measuring up to 13 mm in the precarinal region, nonspecific. These are likely reactive. Lungs/Pleura: There are trace bilateral pleural effusions. No airspace disease or pneumothorax. Scattered  hypoventilatory changes are seen at the lung bases. Central airways are patent. Upper Abdomen: No acute abnormality. Musculoskeletal: No acute or destructive bony lesions. Reconstructed images demonstrate no additional findings. Review of the MIP images confirms the above findings. IMPRESSION: 1. No evidence of pulmonary embolus. 2. No evidence of thoracic aortic aneurysm or dissection. 3. Trace bilateral pleural effusions.  No acute airspace disease. 4. Borderline enlarged mediastinal lymph nodes, likely reactive. 5. Aortic Atherosclerosis (ICD10-I70.0). Coronary artery atherosclerosis. Electronically Signed   By: Randa Ngo M.D.   On: 12/30/2020 21:58    Medications:     stroke: mapping our early stages of recovery  book   Does not apply Once   amLODipine  10 mg Oral Daily   aspirin EC  325 mg Oral Daily   atorvastatin  80 mg Oral Daily   carvedilol  18.75 mg Oral BID WC   clopidogrel  75 mg Oral Daily   folic acid  1 mg Oral Daily   insulin aspart  0-15 Units Subcutaneous TID WC   insulin aspart  0-5 Units Subcutaneous QHS   insulin glargine-yfgn  15 Units Subcutaneous QHS   isosorbide mononitrate  60 mg Oral Daily   multivitamin with minerals  1 tablet Oral Daily   sodium chloride flush  3 mL Intravenous Q12H   sodium chloride flush  3 mL Intravenous Q12H   sodium chloride flush  3 mL Intravenous Q12H   thiamine  100 mg Oral Daily   Or   thiamine  100 mg Intravenous Daily   Continuous Infusions:  sodium chloride     sodium chloride       LOS: 7 days   Geradine Girt  [facility]   How to contact the [facility] Attending or Consulting provider [place] or covering provider during after hours [place], for this patient?  Check the care team in [facility] and look for a) attending/consulting [facility] provider listed and b) the [facility] team listed Log into [email or web address] and use [place] universal password to access. If you do not have the password, please contact the hospital  operator. Locate the [facility] provider you are looking for under [facility] and page to a number that you can be directly reached. If you still have difficulty reaching the provider, please page the [facility] (Director on Call) for the Hospitalists listed on amion for assistance.  12/31/2020, 11:56 AM

## 2020-12-31 NOTE — Progress Notes (Signed)
Mobility Specialist Progress Note:   12/31/20 1153  Mobility  Activity Ambulated in hall  Level of Assistance Modified independent, requires aide device or extra time  Assistive Device None  Distance Ambulated (ft) 370 ft  Mobility Ambulated with assistance in hallway  Mobility Response Tolerated well  Mobility performed by Mobility specialist  Bed Position Chair  $Mobility charge 1 Mobility   Pt received in bed willing to participate in mobility. No complaints of pain. Pt returned to chair with call bell in reach and all needs met.   [facility] Public librarian Phone [phone] Secondary Phone [phone]

## 2020-12-31 NOTE — Progress Notes (Signed)
Discussed with pt IS (750 ml), sternal precautions, mobility post op and d/c planning. Pt receptive. He and his 60 year old uncle live together. He sts his uncle could be there for supervision at d/c but that he does not feel comfortable asking him to cook or clean. Discussed with pt options/suggestions. Gave materials to read/review. He is ambulating without CP but will have NTG at d/c. 1310-1330 Yves Dill CES, ACSM 1:56 PM 12/31/2020

## 2020-12-31 NOTE — Progress Notes (Signed)
HOSPITAL MEDICINE OVERNIGHT EVENT NOTE    Notified by nursing that patient has been exhibiting bouts of bradycardia occasionally dipping to as low as 28 beats per minute as evidenced by telemetry strip under the "CV strip" tab.  Patient is hemodynamically stable with no associated symptoms.  Patient has been in sinus rhythm with no evidence of heart block.  Obtaining 12 lead ecg.  Placing parameters on current regimen of Coreg for it not to be given if HR is less than 60.  HR seems to average currently in the 70's.  Continue to monitor on telemetry.  Mark Emerald  MD [facility]

## 2021-01-01 ENCOUNTER — Other Ambulatory Visit (HOSPITAL_COMMUNITY): Payer: Self-pay

## 2021-01-01 DIAGNOSIS — I214 Non-ST elevation (NSTEMI) myocardial infarction: Secondary | ICD-10-CM | POA: Diagnosis not present

## 2021-01-01 DIAGNOSIS — E1169 Type 2 diabetes mellitus with other specified complication: Secondary | ICD-10-CM | POA: Diagnosis not present

## 2021-01-01 DIAGNOSIS — I6340 Cerebral infarction due to embolism of unspecified cerebral artery: Secondary | ICD-10-CM | POA: Diagnosis not present

## 2021-01-01 DIAGNOSIS — E669 Obesity, unspecified: Secondary | ICD-10-CM | POA: Diagnosis not present

## 2021-01-01 LAB — BASIC METABOLIC PANEL
Anion gap: 11 (ref 5–15)
BUN: 19 mg/dL (ref 6–20)
CO2: 19 mmol/L — ABNORMAL LOW (ref 22–32)
Calcium: 8.7 mg/dL — ABNORMAL LOW (ref 8.9–10.3)
Chloride: 108 mmol/L (ref 98–111)
Creatinine, Ser: 1.45 mg/dL — ABNORMAL HIGH (ref 0.61–1.24)
GFR, Estimated: 55 mL/min — ABNORMAL LOW (ref >60)
Glucose, Bld: 139 mg/dL — ABNORMAL HIGH (ref 70–99)
Potassium: 3.8 mmol/L (ref 3.5–5.1)
Sodium: 138 mmol/L (ref 135–145)

## 2021-01-01 LAB — GLUCOSE, CAPILLARY
Glucose-Capillary: 199 mg/dL — ABNORMAL HIGH (ref 70–99)
Glucose-Capillary: 148 mg/dL — ABNORMAL HIGH (ref 70–99)

## 2021-01-01 LAB — CBC
HCT: 41.7 % (ref 39.0–52.0)
Hemoglobin: 14 g/dL (ref 13.0–17.0)
MCH: 31.3 pg (ref 26.0–34.0)
MCHC: 33.6 g/dL (ref 30.0–36.0)
MCV: 93.3 fL (ref 80.0–100.0)
Platelets: 249 10*3/uL (ref 150–400)
RBC: 4.47 MIL/uL (ref 4.22–5.81)
RDW: 12.4 % (ref 11.5–15.5)
WBC: 11.4 10*3/uL — ABNORMAL HIGH (ref 4.0–10.5)
nRBC: 0 % (ref 0.0–0.2)

## 2021-01-01 MED ORDER — CARVEDILOL 6.25 MG PO TABS
18.7500 mg | ORAL_TABLET | Freq: Two times a day (BID) | ORAL | 0 refills | Status: DC
  Filled 2021-01-01: qty 60, 10d supply, fill #0

## 2021-01-01 MED ORDER — FUROSEMIDE 20 MG PO TABS
20.0000 mg | ORAL_TABLET | Freq: Every day | ORAL | 0 refills | Status: DC | PRN
  Filled 2021-01-01: qty 30, 30d supply, fill #0

## 2021-01-01 MED ORDER — ASPIRIN 325 MG PO TBEC: 325.0000 mg | DELAYED_RELEASE_TABLET | Freq: Every day | ORAL | 0 refills | Status: DC

## 2021-01-01 MED ORDER — BLOOD GLUCOSE METER KIT: PACK | 0 refills | Status: DC

## 2021-01-01 MED ORDER — CLOPIDOGREL BISULFATE 75 MG PO TABS
75.0000 mg | ORAL_TABLET | Freq: Every day | ORAL | 2 refills | Status: DC
  Filled 2021-01-01: qty 30, 30d supply, fill #0

## 2021-01-01 MED ORDER — ISOSORBIDE MONONITRATE ER 60 MG PO TB24
60.0000 mg | ORAL_TABLET | Freq: Every day | ORAL | 0 refills | Status: DC
  Filled 2021-01-01: qty 30, 30d supply, fill #0

## 2021-01-01 MED ORDER — AMLODIPINE BESYLATE 10 MG PO TABS
10.0000 mg | ORAL_TABLET | Freq: Every day | ORAL | 0 refills | Status: DC
  Filled 2021-01-01: qty 30, 30d supply, fill #0

## 2021-01-01 MED ORDER — INSULIN GLARGINE-YFGN 100 UNIT/ML ~~LOC~~ SOLN
18.0000 [IU] | Freq: Every day | SUBCUTANEOUS | Status: DC
  Filled 2021-01-01: qty 0.18

## 2021-01-01 MED ORDER — INSULIN GLARGINE-YFGN 100 UNIT/ML ~~LOC~~ SOPN
18.0000 [IU] | PEN_INJECTOR | Freq: Two times a day (BID) | SUBCUTANEOUS | 0 refills | Status: DC
  Filled 2021-01-01: qty 9, 25d supply, fill #0

## 2021-01-01 MED ORDER — ATORVASTATIN CALCIUM 80 MG PO TABS
80.0000 mg | ORAL_TABLET | Freq: Every day | ORAL | 0 refills | Status: DC
  Filled 2021-01-01: qty 30, 30d supply, fill #0

## 2021-01-01 MED ORDER — NITROGLYCERIN 0.4 MG SL SUBL
0.4000 mg | SUBLINGUAL_TABLET | SUBLINGUAL | 12 refills | Status: DC | PRN
  Filled 2021-01-01: qty 25, 7d supply, fill #0
  Filled 2021-02-07: qty 25, 7d supply, fill #1
  Filled 2021-05-30: qty 25, 25d supply, fill #0
  Filled 2021-06-14: qty 25, 10d supply, fill #1
  Filled 2021-08-08: qty 25, 10d supply, fill #2
  Filled 2021-08-20: qty 25, 10d supply, fill #3

## 2021-01-01 NOTE — Progress Notes (Signed)
SATURATION QUALIFICATIONS: (This note is used to comply with regulatory documentation for home oxygen)  Patient Saturations on Room Air at Rest = 93%  Patient Saturations on Room Air while Ambulating = 93%  Patient Saturations on 0 Liters of oxygen while Ambulating = 93%  Patient does not require oxygen at rest or while ambulating to maintain oxygen saturation.

## 2021-01-01 NOTE — Discharge Instructions (Addendum)
ASA/plavix x 3 months then ASA alone furosemide 20mg  daily prn edema, weight gain of 2lb in a day or 5 lb in a  week

## 2021-01-01 NOTE — Discharge Summary (Signed)
Physician Discharge Summary  [name] FUX:[id number] DOB: 03-Nov-1960 DOA: 12/24/2020  PCP: Patient, No Pcp Per (Inactive)- getting established at the [facility]  Admit date: 12/24/2020 Discharge date: 01/01/2021  Admitted From: home Discharge disposition: home   Recommendations for Outpatient Follow-Up:   Follow up with CVTS for eventual CABG Follow up with cards for BMP and adjustment of BP meds-- goal 140s To establish with PCP for adjustment of insulin ASA 325 plus plavix for 3 months then ASA alone Outpatient thyroid U/S Needs outpatient sleep study   Discharge Diagnosis:   Active Problems:   Chest pain   AKI (acute kidney injury) ([place])   Cerebral embolism with cerebral infarction    Discharge Condition: Improved.  Diet recommendation: Low sodium, heart healthy.  Carbohydrate-modified.    Wound care: None.  Code status: Full.   History of Present Illness:   [name] is a 60 y.o. male with medical history significant of DM2, HTN, CAD s/p PCI, HLD, tobacco abuse, EtOH abuse. Presenting with chest pain. Symptoms started one month ago. He had a midsternal chest tightness/achiness along w/ dyspnea. It felt like he "just ran a sprint w/ a dull ache" preventing him from catching his breath. It felt similar to his first heart attack. These episodes would come in waves lasting 4 - 10 minutes. When they happened, he would try to get still and focus on his breathing. He didn't have any medications to try. He didn't have any syncopal episodes with these events. He has noticed an increase in frequency of these episodes over the last several days. He had an episode that lasted for an hour this morning, so he decided to come to the ED.    ED Course: He was found to be hypertensive. He was started on NTG gtt. His trp were elevated. He was started on heparin gtt. Cardiology was consulted. TRH was called for admission.    Hospital Course by Problem:   CVA -only complaint is  speech difficulty -MRI:  1.Large acute infarct of the superior right cerebellum with mild surrounding edema and petechial hemorrhage. 2. Multiple scattered punctate acute infarcts in multiple vascular territories, consistent with a central cardiac or aortic embolic source. 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 -neurology consult appreciated -PT/OT-no follow up -statin -goal HbA1c: <7 -goal SBP around 140 for now -ASA/plavix x 3 months then ASA alone   NSTEMI     - Cath showed 90% prox-mid RCA, 70-80% RV marginal branches and mild distal RCA disease. Unable to engage left system on initial cath and therefore repeat cath performed 11/16 which showed severe multivessel CAD and was recommended for CABG but plans on hold due to CVA -outpatient plan for CABG once recovered   HTN urgency                 - coreg decreased due to bradycardia             -norvsac added and increased             -imdur added             - prn lasix  -goal SBP 140s   AKI? Vs CKD     - renal US unremarkable     - hold lisinopril; watch nephrotoxins  -outpatient follow up   DM2     -A1C is 9/9-- improved from 5 years ago -goal of <7 -insulin prescribed, follow up outpatient    HLD/CAD     -  ASA, statin, BB   Tobacco abuse/EtOH abuse/Marijuana abuse     - counseled against further use     - no sign of withdrawal   Suspected sleep apnea -needs outpatient sleep study   obesity Body mass index is 34.3 kg/m.   Enlarged right thyroid lobe with multiple hypoattenuating nodules. This has not previously been evaluated, an ultrasound of the thyroid is recommended      Medical Consultants:   Cards CVTS neurology   Discharge Exam:   Vitals:   01/01/21 0755 01/01/21 1144  BP: (!) 148/92 (!) 157/82  Pulse: 80 77  Resp:  19  Temp: 98 F (36.7 C) 98.4 F (36.9 C)  SpO2: 91% 97%   Vitals:   01/01/21 0009 01/01/21 0316 01/01/21 0755 01/01/21 1144  BP: (!) 176/94 (!) 151/91 (!) 148/92 (!)  157/82  Pulse: 85 65 80 77  Resp: (!) 23 20  19   Temp: 98.4 F (36.9 C) 97.9 F (36.6 C) 98 F (36.7 C) 98.4 F (36.9 C)  TempSrc: Oral Oral Oral Oral  SpO2: 93% 91% 91% 97%  Weight:  100.3 kg    Height:        General exam: Appears calm and comfortable.     The results of significant diagnostics from this hospitalization (including imaging, microbiology, ancillary and laboratory) are listed below for reference.     Procedures and Diagnostic Studies:   DG Chest 2 View  Result Date: 12/24/2020 CLINICAL DATA:  Chest pain EXAM: CHEST - 2 VIEW COMPARISON:  08/25/2019 FINDINGS: Cardiopericardial silhouette is at upper limits of normal for size. The lungs are clear without focal pneumonia, edema, pneumothorax or pleural effusion. The visualized bony structures of the thorax show no acute abnormality. Telemetry leads overlie the chest. IMPRESSION: No active cardiopulmonary disease. Electronically Signed   By: Misty Stanley M.D.   On: 12/24/2020 07:10   CARDIAC CATHETERIZATION  Result Date: 12/25/2020   Prox RCA lesion is 90% stenosed.   RV Branch-1 lesion is 70% stenosed.   RV Branch-2 lesion is 80% stenosed.   Prox RCA to Mid RCA lesion is 90% stenosed.   Mid RCA lesion is 20% stenosed.   Mid RCA to Dist RCA lesion is 30% stenosed.   2nd RPL lesion is 50% stenosed. There is severe CAD in a dominant right coronary artery with 90% proximal and mid stenoses, diffuse 70 and 80% stenoses in an RV marginal branch, and mid distal 20 and 30% stenoses. Due to innominate vessel tortuosity as well as significant radial vasospasm, the left coronary system was unable to be cannulated despite attempts with multiple catheters and additional verapamil, intra-arterial nitroglycerin and IV nitroglycerin. Will initiate IV heparin 8 hours post procedure and plan for completion of the diagnostic catheterization to the left coronary system tomorrow via the femoral approach and plan for probable PCI to the RCA  depending upon left coronary findings.   US RENAL  Result Date: 12/24/2020 CLINICAL DATA:  AKI EXAM: RENAL / URINARY TRACT ULTRASOUND COMPLETE COMPARISON:  None. FINDINGS: Right Kidney: Renal measurements: 9.9 x 4.3 x 4.9cm = volume: 110 mL. Echogenicity within normal limits. No mass or hydronephrosis visualized. Left Kidney: Renal measurements: 11.2 x 5.3 x 4.3cm = volume: 133 mL. Echogenicity within normal limits. No mass or hydronephrosis visualized. Urinary bladder: Appears normal for degree of bladder distention. Other: Likely hepatic steatosis. IMPRESSION: 1. Likely hepatic steatosis. 2. Unremarkable renal ultrasound. Electronically Signed   By: Iven Finn M.D.   On: 12/24/2020  17:31   ECHOCARDIOGRAM COMPLETE  Result Date: 12/24/2020    ECHOCARDIOGRAM REPORT   Patient Name:   [name] Date of Exam: 12/24/2020 Medical Rec #:  [id number]  Height:       67.5 in Accession #:    [id number] Weight:       224.1 lb Date of Birth:  1960/02/17  BSA:          2.134 m Patient Age:    14 years   BP:           129/84 mmHg Patient Gender: M          HR:           85 bpm. Exam Location:  Inpatient Procedure: 2D Echo, Color Doppler, Cardiac Doppler and Intracardiac            Opacification Agent Indications:    NSTEMI I21.4  History:        Patient has prior history of Echocardiogram examinations, most                 recent 03/21/2015. CAD; Risk Factors:Hypertension, Diabetes,                 Dyslipidemia and Current Smoker.  Sonographer:    Darlina Sicilian RDCS Referring Phys: [id number] [place]  1. Left ventricular ejection fraction, by estimation, is 55 to 60%. The left ventricle has normal function. The left ventricle demonstrates regional wall motion abnormalities (see scoring diagram/findings for description). Left ventricular diastolic parameters are consistent with Grade I diastolic dysfunction (impaired relaxation).  2. Right ventricular systolic function is normal. The right  ventricular size is normal.  3. The mitral valve is normal in structure. Mild mitral valve regurgitation. No evidence of mitral stenosis.  4. The aortic valve is normal in structure. Aortic valve regurgitation is not visualized. Aortic valve sclerosis is present, with no evidence of aortic valve stenosis.  5. The inferior vena cava is normal in size with greater than 50% respiratory variability, suggesting right atrial pressure of 3 mmHg. FINDINGS  Left Ventricle: Left ventricular ejection fraction, by estimation, is 55 to 60%. The left ventricle has normal function. The left ventricle demonstrates regional wall motion abnormalities. Definity contrast agent was given IV to delineate the left ventricular endocardial borders. The left ventricular internal cavity size was normal in size. There is no left ventricular hypertrophy. Left ventricular diastolic parameters are consistent with Grade I diastolic dysfunction (impaired relaxation).  LV Wall Scoring: The basal inferior segment is hypokinetic. The entire anterior wall, antero-lateral wall, mid and distal lateral wall, mid and distal anterior septum, inferior septum, entire apex, and mid and distal inferior wall are normal. Right Ventricle: The right ventricular size is normal. No increase in right ventricular wall thickness. Right ventricular systolic function is normal. Left Atrium: Left atrial size was normal in size. Right Atrium: Right atrial size was normal in size. Pericardium: There is no evidence of pericardial effusion. Mitral Valve: The mitral valve is normal in structure. Mild mitral annular calcification. Mild mitral valve regurgitation. No evidence of mitral valve stenosis. Tricuspid Valve: The tricuspid valve is normal in structure. Tricuspid valve regurgitation is not demonstrated. No evidence of tricuspid stenosis. Aortic Valve: The aortic valve is normal in structure. Aortic valve regurgitation is not visualized. Aortic valve sclerosis is present,  with no evidence of aortic valve stenosis. Pulmonic Valve: The pulmonic valve was normal in structure. Pulmonic valve regurgitation is not visualized. No evidence of pulmonic stenosis.  Aorta: The aortic root is normal in size and structure. Venous: The inferior vena cava was not well visualized. The inferior vena cava is normal in size with greater than 50% respiratory variability, suggesting right atrial pressure of 3 mmHg. IAS/Shunts: No atrial level shunt detected by color flow Doppler.  LEFT VENTRICLE PLAX 2D LVIDd:         4.20 cm      Diastology LVIDs:         3.60 cm      LV e' medial:    3.75 cm/s LV PW:         1.30 cm      LV E/e' medial:  16.6 LV IVS:        1.10 cm      LV e' lateral:   3.15 cm/s LVOT diam:     2.20 cm      LV E/e' lateral: 19.7 LV SV:         49 LV SV Index:   23 LVOT Area:     3.80 cm  LV Volumes (MOD) LV vol d, MOD A2C: 85.2 ml LV vol d, MOD A4C: 132.0 ml LV vol s, MOD A2C: 34.4 ml LV vol s, MOD A4C: 64.4 ml LV SV MOD A2C:     50.8 ml LV SV MOD A4C:     132.0 ml LV SV MOD BP:      59.9 ml LEFT ATRIUM             Index LA diam:        3.80 cm 1.78 cm/m LA Vol (A2C):   38.0 ml 17.81 ml/m LA Vol (A4C):   21.4 ml 10.03 ml/m LA Biplane Vol: 30.3 ml 14.20 ml/m  AORTIC VALVE LVOT Vmax:   74.40 cm/s LVOT Vmean:  52.200 cm/s LVOT VTI:    0.130 m  AORTA Ao Root diam: 3.10 cm MITRAL VALVE MV Area (PHT): 2.34 cm    SHUNTS MV Decel Time: 324 msec    Systemic VTI:  0.13 m MV E velocity: 62.10 cm/s  Systemic Diam: 2.20 cm MV A velocity: 89.50 cm/s MV E/A ratio:  0.69 Kardie Tobb DO Electronically signed by Berniece Salines DO Signature Date/Time: 12/24/2020/4:04:55 PM    Final      Labs:   Basic Metabolic Panel: Recent Labs  Lab 12/27/20 0143 12/29/20 0231 12/30/20 0805 12/31/20 0159 01/01/21 0307  NA 136 136 136 137 138  K 3.9 3.5 3.5 3.5 3.8  CL 99 104 106 106 108  CO2 27 24 24 25  19*  GLUCOSE 168* 176* 120* 183* 139*  BUN 11 16 14 18 19   CREATININE 1.52* 1.72* 1.78* 1.60*  1.45*  CALCIUM 8.6* 8.8* 8.7* 8.5* 8.7*   GFR Estimated Creatinine Clearance: 61.7 mL/min (A) (by C-G formula based on SCr of 1.45 mg/dL (H)). Liver Function Tests: No results for input(s): AST, ALT, ALKPHOS, BILITOT, PROT, ALBUMIN in the last 168 hours. No results for input(s): LIPASE, AMYLASE in the last 168 hours. No results for input(s): AMMONIA in the last 168 hours. Coagulation profile No results for input(s): INR, PROTIME in the last 168 hours.  CBC: Recent Labs  Lab 12/28/20 0216 12/29/20 0231 12/30/20 0247 12/31/20 0159 01/01/21 0307  WBC 10.0 9.7 10.3 11.1* 11.4*  HGB 12.8* 12.8* 13.6 13.4 14.0  HCT 37.9* 37.8* 40.6 39.9 41.7  MCV 92.4 92.4 92.5 92.6 93.3  PLT 226 296 237 248 249   Cardiac Enzymes: No results for input(s):  CKTOTAL, CKMB, CKMBINDEX, TROPONINI in the last 168 hours. BNP: Invalid input(s): POCBNP CBG: Recent Labs  Lab 12/31/20 1140 12/31/20 1547 12/31/20 2111 01/01/21 0559 01/01/21 1142  GLUCAP 145* 192* 218* 199* 148*   D-Dimer No results for input(s): DDIMER in the last 72 hours. Hgb A1c No results for input(s): HGBA1C in the last 72 hours. Lipid Profile No results for input(s): CHOL, HDL, LDLCALC, TRIG, CHOLHDL, LDLDIRECT in the last 72 hours. Thyroid function studies Recent Labs    12/31/20 0159  TSH 1.356   Anemia work up No results for input(s): VITAMINB12, FOLATE, FERRITIN, TIBC, IRON, RETICCTPCT in the last 72 hours. Microbiology Recent Results (from the past 240 hour(s))  Resp Panel by RT-PCR (Flu A&B, Covid) Nasopharyngeal Swab     Status: None   Collection Time: 12/24/20 12:57 PM   Specimen: Nasopharyngeal Swab; Nasopharyngeal(NP) swabs in vial transport medium  Result Value Ref Range Status   SARS Coronavirus 2 by RT PCR NEGATIVE NEGATIVE Final    Comment: (NOTE) SARS-CoV-2 target nucleic acids are NOT DETECTED.  The SARS-CoV-2 RNA is generally detectable in upper respiratory specimens during the acute phase of  infection. The lowest concentration of SARS-CoV-2 viral copies this assay can detect is 138 copies/mL. A negative result does not preclude SARS-Cov-2 infection and should not be used as the sole basis for treatment or other patient management decisions. A negative result may occur with  improper specimen collection/handling, submission of specimen other than nasopharyngeal swab, presence of viral mutation(s) within the areas targeted by this assay, and inadequate number of viral copies(<138 copies/mL). A negative result must be combined with clinical observations, patient history, and epidemiological information. The expected result is Negative.  Fact Sheet for Patients:  EntrepreneurPulse.com.au  Fact Sheet for Healthcare Providers:  IncredibleEmployment.be  This test is no t yet approved or cleared by the [place] FDA and  has been authorized for detection and/or diagnosis of SARS-CoV-2 by FDA under an Emergency Use Authorization (EUA). This EUA will remain  in effect (meaning this test can be used) for the duration of the COVID-19 declaration under Section 564(b)(1) of the Act, 21 U.S.C.section 360bbb-3(b)(1), unless the authorization is terminated  or revoked sooner.       Influenza A by PCR NEGATIVE NEGATIVE Final   Influenza B by PCR NEGATIVE NEGATIVE Final    Comment: (NOTE) The Xpert Xpress SARS-CoV-2/FLU/RSV plus assay is intended as an aid in the diagnosis of influenza from Nasopharyngeal swab specimens and should not be used as a sole basis for treatment. Nasal washings and aspirates are unacceptable for Xpert Xpress SARS-CoV-2/FLU/RSV testing.  Fact Sheet for Patients: EntrepreneurPulse.com.au  Fact Sheet for Healthcare Providers: IncredibleEmployment.be  This test is not yet approved or cleared by the [place] FDA and has been authorized for detection and/or diagnosis of SARS-CoV-2  by FDA under an Emergency Use Authorization (EUA). This EUA will remain in effect (meaning this test can be used) for the duration of the COVID-19 declaration under Section 564(b)(1) of the Act, 21 U.S.C. section 360bbb-3(b)(1), unless the authorization is terminated or revoked.  Performed at [facility], [place] [address] [place],  [address]      Discharge Instructions:   Discharge Instructions     (HEART FAILURE PATIENTS) Call MD:  Anytime you have any of the following symptoms: 1) 3 pound weight gain in 24 hours or 5 pounds in 1 week 2) shortness of breath, with or without a dry hacking cough 3) swelling in  the hands, feet or stomach 4) if you have to sleep on extra pillows at night in order to breathe.   Complete by: As directed    Ambulatory referral to Neurology   Complete by: As directed    Follow up with stroke clinic NP (Steffanie Mingle Vanschaick or Cecille Rubin, if both not available, consider Zachery Dauer, or A[place]) at [facility] in about 4 weeks. Thanks.   Ambulatory referral to Nutrition and Diabetic Education   Complete by: As directed    Diet - low sodium heart healthy   Complete by: As directed    Diet Carb Modified   Complete by: As directed    Discharge instructions   Complete by: As directed    ASA/plavix x 3 months then ASA alone   Increase activity slowly   Complete by: As directed       Allergies as of 01/01/2021       Reactions   Aspirin Other (See Comments)   Upset stomach         Medication List     STOP taking these medications    GOODY HEADACHE PO   insulin NPH-regular Human (70-30) 100 UNIT/ML injection   lisinopril 20 MG tablet Commonly known as: ZESTRIL   prasugrel 10 MG Tabs tablet Commonly known as: EFFIENT       TAKE these medications    amLODipine 10 MG tablet Commonly known as: NORVASC Take 1 tablet (10 mg total) by mouth daily. Start taking on: January 02, 2021   aspirin 325 MG EC tablet Take 1  tablet (325 mg total) by mouth daily. Start taking on: January 02, 2021 What changed:  medication strength how much to take   atorvastatin 80 MG tablet Commonly known as: LIPITOR Take 1 tablet (80 mg total) by mouth daily. Start taking on: January 02, 2021 What changed: when to take this   blood glucose meter kit and supplies Dispense based on patient and insurance preference. Use up to four times daily as directed. (FOR ICD-10 E10.9, E11.9).   carvedilol 6.25 MG tablet Commonly known as: COREG Take 3 tablets (18.75 mg total) by mouth 2 (two) times daily with a meal. What changed:  medication strength how much to take   clopidogrel 75 MG tablet Commonly known as: PLAVIX Take 1 tablet (75 mg total) by mouth daily. Start taking on: January 02, 2021   furosemide 20 MG tablet Commonly known as: Lasix Take 1 tablet (20 mg total) by mouth daily as needed for edema.   insulin glargine-yfgn 100 UNIT/ML Pen Commonly known as: Semglee (yfgn) Inject 18 Units into the skin 2 (two) times daily.   isosorbide mononitrate 60 MG 24 hr tablet Commonly known as: IMDUR Take 1 tablet (60 mg total) by mouth daily. Start taking on: January 02, 2021   nitroGLYCERIN 0.4 MG SL tablet Commonly known as: NITROSTAT Place 1 tablet (0.4 mg total) under the tongue every 5 (five) minutes as needed for chest pain (CP or SOB).        Follow-up Information     [facility]. Go on 12/31/2020.   Why: @11 :00am Contact information: [place] [phone]        [facility]. Schedule an appointment as soon as possible for a visit in 1 month(s).   Specialty: Neurology Why: stroke clinic Contact information: [address] [place],  Jennye Moccasin, PA-C Follow up on 01/09/2021.   Specialty: Cardiology Why: at 8:45am for your follow up  appt Contact information: [address] [place] [address] [phone]                  Time coordinating discharge: 35 min  Signed:  Geradine Girt DO  [facility] 01/01/2021, 12:31 PM

## 2021-01-01 NOTE — Progress Notes (Addendum)
Progress Note  Patient Name: [name] Date of Encounter: 01/01/2021  [facility] Cardiologist: Freada Bergeron, MD   Subjective   No chest pain or SOB Has resolved to have a healthier lifestyle Promises compliance w/ meds Scale is broken, he will get a new one.   Inpatient Medications    Scheduled Meds:   stroke: mapping our early stages of recovery book   Does not apply Once   amLODipine  10 mg Oral Daily   aspirin EC  325 mg Oral Daily   atorvastatin  80 mg Oral Daily   carvedilol  18.75 mg Oral BID WC   clopidogrel  75 mg Oral Daily   folic acid  1 mg Oral Daily   insulin aspart  0-15 Units Subcutaneous TID WC   insulin aspart  0-5 Units Subcutaneous QHS   insulin glargine-yfgn  15 Units Subcutaneous QHS   isosorbide mononitrate  60 mg Oral Daily   multivitamin with minerals  1 tablet Oral Daily   sodium chloride flush  3 mL Intravenous Q12H   sodium chloride flush  3 mL Intravenous Q12H   sodium chloride flush  3 mL Intravenous Q12H   thiamine  100 mg Oral Daily   Or   thiamine  100 mg Intravenous Daily   Continuous Infusions:  sodium chloride     sodium chloride     PRN Meds: sodium chloride, sodium chloride, acetaminophen, hydrALAZINE, ondansetron (ZOFRAN) IV, sodium chloride flush, sodium chloride flush   Vital Signs    Vitals:   12/31/20 1938 01/01/21 0009 01/01/21 0316 01/01/21 0755  BP: (!) 147/74 (!) 176/94 (!) 151/91 (!) 148/92  Pulse: 70 85 65 80  Resp: 20 (!) 23 20   Temp: 98.3 F (36.8 C) 98.4 F (36.9 C) 97.9 F (36.6 C) 98 F (36.7 C)  TempSrc: Oral Oral Oral Oral  SpO2: 91% 93% 91% 91%  Weight:   100.3 kg   Height:        Intake/Output Summary (Last 24 hours) at 01/01/2021 0937 Last data filed at 01/01/2021 0317 Gross per 24 hour  Intake 358 ml  Output --  Net 358 ml   Last 3 Weights 01/01/2021 12/31/2020 12/30/2020  Weight (lbs) 221 lb 1.6 oz 222 lb 4.8 oz 222 lb 14.4 oz  Weight (kg) 100.29 kg 100.835 kg 101.107 kg       Telemetry    SR, frequent PVCs and PACs - Personally Reviewed  ECG    N/a - Personally Reviewed  Physical Exam   GEN: No acute distress.   Neck: No JVD Cardiac: RRR, no murmur, no rubs, or gallops.  Respiratory: few rales in the bases. GI: Soft, nontender, non-distended  MS: No edema; No deformity. Neuro:  Nonfocal  Psych: Normal affect   Labs    High Sensitivity Troponin:   Recent Labs  Lab 12/24/20 0651 12/24/20 0851 12/24/20 1152 12/24/20 1352  TROPONINIHS 158* 171* 215* 233*     Chemistry Recent Labs  Lab 12/30/20 0805 12/31/20 0159 01/01/21 0307  NA 136 137 138  K 3.5 3.5 3.8  CL 106 106 108  CO2 24 25 19*  GLUCOSE 120* 183* 139*  BUN 14 18 19   CREATININE 1.78* 1.60* 1.45*  CALCIUM 8.7* 8.5* 8.7*  GFRNONAA 43* 49* 55*  ANIONGAP 6 6 11     Lab Results  Component Value Date   ALT 32 12/25/2020   AST 22 12/25/2020   ALKPHOS 95 12/25/2020   BILITOT 0.8 12/25/2020  Hematology Recent Labs  Lab 12/30/20 0247 12/31/20 0159 01/01/21 0307  WBC 10.3 11.1* 11.4*  RBC 4.39 4.31 4.47  HGB 13.6 13.4 14.0  HCT 40.6 39.9 41.7  MCV 92.5 92.6 93.3  MCH 31.0 31.1 31.3  MCHC 33.5 33.6 33.6  RDW 12.4 12.4 12.4  PLT 237 248 249   Thyroid  Recent Labs  Lab 12/31/20 0159  TSH 1.356    Lab Results  Component Value Date   CHOL 260 (H) 12/25/2020   HDL 48 12/25/2020   LDLCALC 165 (H) 12/25/2020   TRIG 235 (H) 12/25/2020   CHOLHDL 5.4 12/25/2020   Lab Results  Component Value Date   HGBA1C 9.9 (H) 12/25/2020    BNPNo results for input(s): BNP, PROBNP in the last 168 hours.  DDimer No results for input(s): DDIMER in the last 168 hours.   Radiology    CT ANGIO CHEST AORTA W/CM & OR WO/CM  Result Date: 12/30/2020 CLINICAL DATA:  Aortic disease, preoperative evaluation, history of stroke EXAM: CT ANGIOGRAPHY CHEST WITH CONTRAST TECHNIQUE: Multidetector CT imaging of the chest was performed using the standard protocol during bolus  administration of intravenous contrast. Multiplanar CT image reconstructions and MIPs were obtained to evaluate the vascular anatomy. CONTRAST:  71mL OMNIPAQUE IOHEXOL 350 MG/ML SOLN COMPARISON:  12/28/2020, 12/24/2020, 03/16/2015 FINDINGS: Cardiovascular: The heart is unremarkable without pericardial effusion. Moderate coronary artery atherosclerosis most pronounced within the LAD and circumflex distributions. No evidence of thoracic aortic aneurysm or dissection. Mild atherosclerosis of the aortic arch and great vessels. Common origin of the innominate artery and left common carotid artery off the aortic arch. Direct origin of the left vertebral artery from the aortic arch. No pulmonary emboli or filling defects. Mediastinum/Nodes: Stable heterogeneous enlargement of thyroid compatible with goiter. Given stability since 2017, this can be considered benign. Trachea and esophagus are unremarkable. Borderline enlarged mediastinal lymph nodes are seen measuring up to 13 mm in the precarinal region, nonspecific. These are likely reactive. Lungs/Pleura: There are trace bilateral pleural effusions. No airspace disease or pneumothorax. Scattered hypoventilatory changes are seen at the lung bases. Central airways are patent. Upper Abdomen: No acute abnormality. Musculoskeletal: No acute or destructive bony lesions. Reconstructed images demonstrate no additional findings. Review of the MIP images confirms the above findings. IMPRESSION: 1. No evidence of pulmonary embolus. 2. No evidence of thoracic aortic aneurysm or dissection. 3. Trace bilateral pleural effusions.  No acute airspace disease. 4. Borderline enlarged mediastinal lymph nodes, likely reactive. 5. Aortic Atherosclerosis (ICD10-I70.0). Coronary artery atherosclerosis. Electronically Signed   By: Randa Ngo M.D.   On: 12/30/2020 21:58    Cardiac Studies   TTE Bubble 12/28/20: FINDINGS   IAS/Shunts: Agitated saline contrast was given intravenously to  evaluate  for intracardiac shunting. Agitated saline contrast bubble study was  negative, with no evidence of any interatrial shunt. There is no evidence  of a patent foramen ovale. There is  no evidence of an atrial septal defect.   LHC 12/26/20:   Prox Cx lesion is 60% stenosed.   Prox Cx to Mid Cx lesion is 90% stenosed.   1st Mrg-2 lesion is 80% stenosed.   Mid LAD lesion is 50% stenosed.   Mid LAD to Dist LAD lesion is 80% stenosed.   2nd Diag lesion is 80% stenosed.   RPAV lesion is 50% stenosed.   RV Branch-1 lesion is 70% stenosed.   RV Branch-2 lesion is 80% stenosed.   Prox RCA-1 lesion is 90% stenosed.  Prox RCA to Mid RCA lesion is 95% stenosed.   Mid RCA lesion is 20% stenosed.   Prox RCA-2 lesion is 65% stenosed.   Dist RCA lesion is 20% stenosed.   1st Diag lesion is 90% stenosed.   Prox LAD lesion is 30% stenosed.   Previously placed Mid Cx to Dist Cx stent (unknown type) is  widely patent.   Previously placed 1st Mrg-1 stent (unknown type) is  widely patent.   LV end diastolic pressure is severely elevated.   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  In this diabetic male with significant diffuse disease in the LAD, left circumflex, and right coronary artery recommend surgical evaluation for consideration of CABG revascularization.   RECOMMENDATION: Surgical consultation for consideration of CABG.  We will reinitiate heparin later this evening.  Patient continues to be on intravenous nitroglycerin.  Increase medical therapy as blood pressure and heart rate allow.  Aggressive lipid-lowering therapy with target LDL in the 50s or below. Diagnostic Dominance: Right   Echo 12/24/20:  1. Left ventricular ejection fraction, by estimation, is 55 to 60%. The  left ventricle has normal function. The left ventricle demonstrates  regional wall motion  abnormalities (see scoring diagram/findings for  description). Left ventricular diastolic parameters are consistent with Grade I diastolic dysfunction (impaired relaxation).   2. Right ventricular systolic function is normal. The right ventricular  size is normal.   3. The mitral valve is normal in structure. Mild mitral valve  regurgitation. No evidence of mitral stenosis.   4. The aortic valve is normal in structure. Aortic valve regurgitation is not visualized. Aortic valve sclerosis is present, with no evidence of aortic valve stenosis.   5. The inferior vena cava is normal in size with greater than 50%  respiratory variability, suggesting right atrial pressure of 3 mmHg.     Patient Profile     60 y.o. male with multivessel CAD (70% mid RCA, 95% OM1, 90% distal LCx, 90% OM3, 80% prox LAD and 90% ost D1 with normal LV function s/p PCI to LAD, left PLA, OM1), hypertension, hyperlipidemia, CKD, DM and tobacco abuse admitted with NSTEMI and recommended CABG.  Hospitalization complicated by large acute CVA consistent with embolic stroke.   Assessment & Plan    # 3v CAD:  # NSTEMI:  # Hyperlipidemia: - cath report above, 1st procedure not successful, 2nd one was - 3 v dz >> CABG  - EF 55-60% w/ inf HK - on ASA 325 mg (for CVA), BB, Plavix, statin, Imdur - tolerating ambulation well. - f/u as outpt  # Acute CVA:  - felt likely embolic - monitor at d/c - CT angio w/ aortic atherosclerosis  # Hypertension:  - w/ CVA, permissive HTN - need guidelines from Neuro for BP - BP range 181/110 - 133/92 last 24 hr - HR 60s-80s  # Bradycardia: # Likely OSA: - high likelihood he has OSA>> sleep study as outpt - No sig bradycardia on reduced dose Coreg  For questions or updates, please contact [facility] Please consult [email or web address] for contact info under        Signed, Rosaria Ferries, PA-C  01/01/2021, 9:37 AM

## 2021-01-01 NOTE — Progress Notes (Signed)
Discharge paperwork reviewed with patient. All questions answered. Medication regimen reviewed and meds sent to transition pharmacy delivered. PIV removed. Pt left via wheelchair with transport.

## 2021-01-01 NOTE — TOC Progression Note (Signed)
Transition of Care [facility]) - Progression Note    Patient Details  Name: [name] MRN: [id number] Date of Birth: 05-Oct-1960  Transition of Care [facility]) CM/SW Contact  Zenon Mayo, RN Phone Number: 01/01/2021, 12:53 PM  Clinical Narrative:    NCM gave patient SA resources, he is for dc today.        Expected Discharge Plan and Services           Expected Discharge Date: 01/01/21                                     Social Determinants of Health (SDOH) Interventions    Readmission Risk Interventions No flowsheet data found.

## 2021-01-02 ENCOUNTER — Encounter: Payer: Self-pay | Admitting: *Deleted

## 2021-01-02 NOTE — Progress Notes (Signed)
Patient ID: [name], male   DOB: [birth year], 60 y.o.   MRN: [id number] Patient enrolled for [employer] to ship a 30 day cardiac event monitor to his home. Letter with instructions mailed to patient.

## 2021-01-07 NOTE — Progress Notes (Deleted)
Cardiology Office Note    Date:  01/07/2021   ID:  [name], DOB 29-Jul-1960, MRN [id number]   PCP:  Patient, No Pcp Per (Inactive)   [place]  Cardiologist:  Freada Bergeron, MD *** Advanced Practice Provider:  No care team member to display Electrophysiologist:  None   [id number]}   No chief complaint on file.   History of Present Illness:  [name] is a 60 y.o. male with medical history significant of DM2, HTN, CAD s/p PCI, HLD, CKD,tobacco abuse, EtOH abuse.   Presenting with NSTEMI and cath with multivessel CAD (70% mid RCA, 95% OM1, 90% distal LCx, 90% OM3, 80% prox LAD and 90% ost D1 with normal LV function s/p PCI to LAD, left PLA, OM1), CABG recommended but will need to wait 4-6 weeks and clear by neuro befor surgery.  Hospitalization complicated by large acute CVA consistent with embolic stroke, suspected sleep apnea      Past Medical History:  Diagnosis Date   CAD (coronary artery disease)    a. 03/2015 NSTEMI: LHC with severe 3V CAD  (70% mid RCA, 95% OM1, 90% distal LCx, 90% OM3, 80% prox LAD and 90% ost D1) s/p DES to mLAD w/ small dissction Rx with DES, staged ost Ramus PCI/DES and dLCx s/p PCI/DES    Diabetes mellitus type 2 in obese ([place])    Diverticulosis    Hypercholesteremia    Hypertension    Obesity    Tobacco abuse     Past Surgical History:  Procedure Laterality Date   CARDIAC CATHETERIZATION N/A 03/19/2015   Procedure: Left Heart Cath and Coronary Angiography;  Surgeon: Lorretta Harp, MD;  [facility];  Service: Cardiovascular;  Laterality: N/A;   CARDIAC CATHETERIZATION N/A 03/20/2015   Procedure: Coronary Stent Intervention;  Surgeon: Lorretta Harp, MD;  Location: [facility];  Service: Cardiovascular;  Laterality: N/A;   CARDIAC CATHETERIZATION N/A 03/22/2015   Procedure: Coronary Stent Intervention;  Surgeon: Lorretta Harp, MD;  Location: [facility];  Service:  Cardiovascular;  Laterality: N/A;   LEFT HEART CATH AND CORONARY ANGIOGRAPHY N/A 12/25/2020   Procedure: LEFT HEART CATH AND CORONARY ANGIOGRAPHY;  Surgeon: Troy Sine, MD;  Location: [facility];  Service: Cardiovascular;  Laterality: N/A;   LEFT HEART CATH AND CORONARY ANGIOGRAPHY N/A 12/26/2020   Procedure: LEFT HEART CATH AND CORONARY ANGIOGRAPHY;  Surgeon: Troy Sine, MD;  Location: [facility];  Service: Cardiovascular;  Laterality: N/A;    Current Medications: No outpatient medications have been marked as taking for the 01/09/21 encounter (Appointment) with Imogene Burn, PA-C.     Allergies:   Aspirin   Social History   Socioeconomic History   Marital status: Married    Spouse name: Not on file   Number of children: Not on file   Years of education: Not on file   Highest education level: Not on file  Occupational History   Not on file  Tobacco Use   Smoking status: Every Day    Packs/day: 0.50    Types: Cigarettes   Smokeless tobacco: Never  Vaping Use   Vaping Use: Never used  Substance and Sexual Activity   Alcohol use: Yes    Comment: weekend   Drug use: Yes    Types: Marijuana   Sexual activity: Not on file  Other Topics Concern   Not on file  Social History Narrative  Not on file   Social Determinants of Health   Financial Resource Strain: Not on file  Food Insecurity: Not on file  Transportation Needs: Not on file  Physical Activity: Not on file  Stress: Not on file  Social Connections: Not on file     Family History:  The patient's ***family history is not on file.   ROS:   Please see the history of present illness.    ROS All other systems reviewed and are negative.   PHYSICAL EXAM:   VS:  There were no vitals taken for this visit.  Physical Exam  GEN: Well nourished, well developed, in no acute distress  HEENT: normal  Neck: no JVD, carotid bruits, or masses Cardiac:RRR; no murmurs, rubs, or gallops   Respiratory:  clear to auscultation bilaterally, normal work of breathing GI: soft, nontender, nondistended, + BS Ext: without cyanosis, clubbing, or edema, Good distal pulses bilaterally MS: no deformity or atrophy  Skin: warm and dry, no rash Neuro:  Alert and Oriented x 3, Strength and sensation are intact Psych: euthymic mood, full affect  Wt Readings from Last 3 Encounters:  01/01/21 221 lb 1.6 oz (100.3 kg)  03/28/15 225 lb (102.1 kg)  03/23/15 224 lb 13.9 oz (102 kg)      Studies/Labs Reviewed:   EKG:  EKG is*** ordered today.  The ekg ordered today demonstrates ***  Recent Labs: 12/24/2020: B Natriuretic Peptide 412.0 12/25/2020: ALT 32 12/31/2020: TSH 1.356 01/01/2021: BUN 19; Creatinine, Ser 1.45; Hemoglobin 14.0; Platelets 249; Potassium 3.8; Sodium 138   Lipid Panel    Component Value Date/Time   CHOL 260 (H) 12/25/2020 0031   TRIG 235 (H) 12/25/2020 0031   HDL 48 12/25/2020 0031   CHOLHDL 5.4 12/25/2020 0031   VLDL 47 (H) 12/25/2020 0031   LDLCALC 165 (H) 12/25/2020 0031    Additional studies/ records that were reviewed today include:  TTE Bubble 12/28/20: FINDINGS   IAS/Shunts: Agitated saline contrast was given intravenously to evaluate  for intracardiac shunting. Agitated saline contrast bubble study was  negative, with no evidence of any interatrial shunt. There is no evidence  of a patent foramen ovale. There is  no evidence of an atrial septal defect.    LHC 12/26/20:   Prox Cx lesion is 60% stenosed.   Prox Cx to Mid Cx lesion is 90% stenosed.   1st Mrg-2 lesion is 80% stenosed.   Mid LAD lesion is 50% stenosed.   Mid LAD to Dist LAD lesion is 80% stenosed.   2nd Diag lesion is 80% stenosed.   RPAV lesion is 50% stenosed.   RV Branch-1 lesion is 70% stenosed.   RV Branch-2 lesion is 80% stenosed.   Prox RCA-1 lesion is 90% stenosed.   Prox RCA to Mid RCA lesion is 95% stenosed.   Mid RCA lesion is 20% stenosed.   Prox RCA-2 lesion is 65%  stenosed.   Dist RCA lesion is 20% stenosed.   1st Diag lesion is 90% stenosed.   Prox LAD lesion is 30% stenosed.   Previously placed Mid Cx to Dist Cx stent (unknown type) is  widely patent.   Previously placed 1st Mrg-1 stent (unknown type) is  widely patent.   LV end diastolic pressure is severely elevated.   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  In this diabetic male with significant diffuse disease in the LAD, left  circumflex, and right coronary artery recommend surgical evaluation for consideration of CABG revascularization.   RECOMMENDATION: Surgical consultation for consideration of CABG.  We will reinitiate heparin later this evening.  Patient continues to be on intravenous nitroglycerin.  Increase medical therapy as blood pressure and heart rate allow.  Aggressive lipid-lowering therapy with target LDL in the 50s or below.   Risk Assessment/Calculations:   {Does this patient have ATRIAL FIBRILLATION?:[id number]}     ASSESSMENT:    1. Coronary artery disease involving native coronary artery of native heart without angina pectoris   2. History of CVA (cerebrovascular accident)   3. Essential hypertension   4. Mixed hyperlipidemia   5. Tobacco abuse      PLAN:  In order of problems listed above:  CAD status post NSTEMI 12/2020 with multivessel CAD CABG recommended but needs to wait for 6 weeks and cleared by neurology because of embolic CVA while hospitalized  Large acute CVA consistent with embolic stroke followed by neurology  Hypertension recommended adding ARB and reducing amlodipine in favor of this.  Lasix as needed  CKD creatinine 1.45 at discharge 01/01/2021  HLD LDL 165, triglycerides 235 12/25/2020  Tobacco abuse  Shared Decision Making/Informed Consent   {Are you ordering a CV Procedure (e.g. stress test, cath, DCCV, TEE, etc)?    Press F2        :[id number]}    Medication Adjustments/Labs and Tests Ordered: Current medicines are reviewed at length with the patient today.  Concerns regarding medicines are outlined above.  Medication changes, Labs and Tests ordered today are listed in the Patient Instructions below. There are no Patient Instructions on file for this visit.   Sumner Boast, PA-C  01/07/2021 10:18 AM    [facility] [address], [place], [place]  [address] Phone: [phone]; Fax: [phone]

## 2021-01-09 ENCOUNTER — Ambulatory Visit: Payer: No Typology Code available for payment source | Admitting: Physician Assistant

## 2021-01-14 ENCOUNTER — Other Ambulatory Visit (HOSPITAL_COMMUNITY): Payer: Self-pay

## 2021-01-28 ENCOUNTER — Encounter (HOSPITAL_COMMUNITY): Payer: Self-pay | Admitting: Radiology

## 2021-01-29 ENCOUNTER — Telehealth (HOSPITAL_COMMUNITY): Payer: Self-pay

## 2021-01-29 NOTE — Telephone Encounter (Signed)
Transitions of Care Pharmacy   Call attempted for a pharmacy transitions of care follow-up. Unable to leave voicemail.   Call attempt #2. Will follow-up in 2-3 days.    

## 2021-01-30 ENCOUNTER — Telehealth (HOSPITAL_COMMUNITY): Payer: Self-pay

## 2021-01-30 NOTE — Telephone Encounter (Signed)
Transitions of Care Pharmacy   Call attempted for a pharmacy transitions of care follow-up. Unable to leave voicemail.  Call attempt #3. Will no longer attempt follow up for TOC pharmacy   

## 2021-01-30 NOTE — Progress Notes (Signed)
Cardiology Office Note    Date:  02/13/2021   ID:  [name], DOB [birth year], MRN [id number]   PCP:  Patient, No Pcp Per (Inactive)   [place]  Cardiologist:  Freada Bergeron, MD   Advanced Practice Provider:  No care team member to display Electrophysiologist:  None   [id number]}   Chief Complaint  Patient presents with   Hospitalization Follow-up    History of Present Illness:  [name] is a 60 y.o. male with medical history significant of DM2, HTN, CAD s/p PCI, HLD, CKD,tobacco abuse, EtOH abuse.   Presenting with NSTEMI and cath with multivessel CAD (70% mid RCA, 95% OM1, 90% distal LCx, 90% OM3, 80% prox LAD and 90% ost D1 with normal LV function s/p PCI to LAD, left PLA, OM1), CABG recommended but will need to wait 4-6 weeks and clear by neuro befor surgery.  Hospitalization complicated by large acute CVA consistent with embolic stroke.   Patient comes in for f/u. Patient has had some chest tightness with activity every 2-3 days relieved with 1 NTG occasionally 2. Vaping on occasion. Drinking vodka every 3 or 4 nights. He says he speech is not normal but otherwise has no side effects from the stroke. Patient didn't understand the monitor but is willing to wear. Was unaware that he has an appt with Dr. Cyndia Bent this am and hasn't seen neurology back.    Past Medical History:  Diagnosis Date   CAD (coronary artery disease)    a. 03/2015 NSTEMI: LHC with severe 3V CAD  (70% mid RCA, 95% OM1, 90% distal LCx, 90% OM3, 80% prox LAD and 90% ost D1) s/p DES to mLAD w/ small dissction Rx with DES, staged ost Ramus PCI/DES and dLCx s/p PCI/DES    Diabetes mellitus type 2 in obese ([place])    Diverticulosis    Hypercholesteremia    Hypertension    Obesity    Tobacco abuse     Past Surgical History:  Procedure Laterality Date   CARDIAC CATHETERIZATION N/A 03/19/2015   Procedure: Left Heart Cath and Coronary Angiography;  Surgeon: Lorretta Harp, MD;   Location: [facility];  Service: Cardiovascular;  Laterality: N/A;   CARDIAC CATHETERIZATION N/A 03/20/2015   Procedure: Coronary Stent Intervention;  Surgeon: Lorretta Harp, MD;  Location: [facility];  Service: Cardiovascular;  Laterality: N/A;   CARDIAC CATHETERIZATION N/A 03/22/2015   Procedure: Coronary Stent Intervention;  Surgeon: Lorretta Harp, MD;  Location: [facility];  Service: Cardiovascular;  Laterality: N/A;   LEFT HEART CATH AND CORONARY ANGIOGRAPHY N/A 12/25/2020   Procedure: LEFT HEART CATH AND CORONARY ANGIOGRAPHY;  Surgeon: Troy Sine, MD;  Location: [facility];  Service: Cardiovascular;  Laterality: N/A;   LEFT HEART CATH AND CORONARY ANGIOGRAPHY N/A 12/26/2020   Procedure: LEFT HEART CATH AND CORONARY ANGIOGRAPHY;  Surgeon: Troy Sine, MD;  Location: [facility];  Service: Cardiovascular;  Laterality: N/A;    Current Medications: Current Meds  Medication Sig   amLODipine (NORVASC) 10 MG tablet Take 1 tablet (10 mg total) by mouth daily.   aspirin EC 325 MG EC tablet Take 1 tablet (325 mg total) by mouth daily.   atorvastatin (LIPITOR) 80 MG tablet Take 1 tablet (80 mg total) by mouth daily.   blood glucose meter kit and supplies Dispense based on patient and insurance preference. Use up to four times daily as directed. (  FOR ICD-10 E10.9, E11.9).   carvedilol (COREG) 6.25 MG tablet Take 3 tablets (18.75 mg total) by mouth 2 (two) times daily with a meal.   clopidogrel (PLAVIX) 75 MG tablet Take 1 tablet (75 mg total) by mouth daily.   furosemide (LASIX) 20 MG tablet Take 1 tablet (20 mg total) by mouth daily as needed for edema.   insulin glargine-yfgn (SEMGLEE, YFGN,) 100 UNIT/ML Pen Inject 18 Units into the skin 2 (two) times daily.   isosorbide mononitrate (IMDUR) 60 MG 24 hr tablet Take 1.5 tablets (90 mg total) by mouth daily.   nitroGLYCERIN (NITROSTAT) 0.4 MG SL tablet Place 1 tablet (0.4 mg total) under the tongue  every 5 (five) minutes as needed for chest pain (CP or SOB).   [DISCONTINUED] isosorbide mononitrate (IMDUR) 60 MG 24 hr tablet Take 1 tablet (60 mg total) by mouth daily.     Allergies:   Aspirin   Social History   Socioeconomic History   Marital status: Married    Spouse name: Not on file   Number of children: Not on file   Years of education: Not on file   Highest education level: Not on file  Occupational History   Not on file  Tobacco Use   Smoking status: Every Day    Packs/day: 0.50    Types: Cigarettes   Smokeless tobacco: Never  Vaping Use   Vaping Use: Never used  Substance and Sexual Activity   Alcohol use: Yes    Comment: weekend   Drug use: Yes    Types: Marijuana   Sexual activity: Not on file  Other Topics Concern   Not on file  Social History Narrative   Not on file   Social Determinants of Health   Financial Resource Strain: Not on file  Food Insecurity: Not on file  Transportation Needs: Not on file  Physical Activity: Not on file  Stress: Not on file  Social Connections: Not on file     Family History:  The patient's  family history is not on file.   ROS:   Please see the history of present illness.    ROS All other systems reviewed and are negative.   PHYSICAL EXAM:   VS:  BP 130/86    Pulse 98    Ht 5' 7" (1.702 m)    Wt 216 lb 9.6 oz (98.2 kg)    SpO2 96%    BMI 33.92 kg/m   Physical Exam  GEN: Obese, in no acute distress  Neck: no JVD, carotid bruits, or masses Cardiac:RRR; no murmurs, rubs, or gallops  Respiratory:  clear to auscultation bilaterally, normal work of breathing GI: soft, nontender, nondistended, + BS Ext: right arm at cath site stable without hematoma or hemorrhage, LE without cyanosis, clubbing, or edema, Good distal pulses bilaterally Neuro:  Alert and Oriented x 3 Psych: euthymic mood, full affect  Wt Readings from Last 3 Encounters:  02/13/21 216 lb 9.6 oz (98.2 kg)  01/01/21 221 lb 1.6 oz (100.3 kg)   03/28/15 225 lb (102.1 kg)      Studies/Labs Reviewed:   EKG:  EKG is not ordered today.     Recent Labs: 12/24/2020: B Natriuretic Peptide 412.0 12/25/2020: ALT 32 12/31/2020: TSH 1.356 01/01/2021: BUN 19; Creatinine, Ser 1.45; Hemoglobin 14.0; Platelets 249; Potassium 3.8; Sodium 138   Lipid Panel    Component Value Date/Time   CHOL 260 (H) 12/25/2020 0031   TRIG 235 (H) 12/25/2020 0031   HDL 48  12/25/2020 0031   CHOLHDL 5.4 12/25/2020 0031   VLDL 47 (H) 12/25/2020 0031   LDLCALC 165 (H) 12/25/2020 0031    Additional studies/ records that were reviewed today include:  Echo 12/24/20:  1. Left ventricular ejection fraction, by estimation, is 55 to 60%. The  left ventricle has normal function. The left ventricle demonstrates  regional wall motion abnormalities (see scoring diagram/findings for  description). Left ventricular diastolic parameters are consistent with Grade I diastolic dysfunction (impaired relaxation).   2. Right ventricular systolic function is normal. The right ventricular  size is normal.   3. The mitral valve is normal in structure. Mild mitral valve  regurgitation. No evidence of mitral stenosis.   4. The aortic valve is normal in structure. Aortic valve regurgitation is not visualized. Aortic valve sclerosis is present, with no evidence of aortic valve stenosis.   5. The inferior vena cava is normal in size with greater than 50%  respiratory variability, suggesting right atrial pressure of 3 mmHg.    Risk Assessment/Calculations:         ASSESSMENT:    1. Coronary artery disease involving native coronary artery of native heart without angina pectoris   2. History of CVA (cerebrovascular accident)   3. Essential hypertension   4. AKI (acute kidney injury) ([place])   5. Hyperlipidemia, unspecified hyperlipidemia type   6. Tobacco abuse      PLAN:  In order of problems listed above:  CAD status post NSTEMI 12/2020 with multivessel CAD CABG  recommended but needs to wait for 6 weeks and cleared by neurology because of embolic CVA while hospitalized-having exertional angina relieved with NTG. Will increase Imdur 90 mg daily. He was unaware of appt with Dr. Cyndia Bent this am-have given him directions.  Large acute CVA consistent with embolic stroke followed by neurology-will try to make him an appt with stroke clinic-he was supposed to be seen  Hypertension BP stable to increase Imdur  CKD creatinine 1.45 at discharge 01/01/2021-repeat today  HLD LDL 165, triglycerides 235 12/25/2020  Tobacco abuse-vaping on occasion now. Complete cessation discussed  ETOH-has reduced intake   Shared Decision Making/Informed Consent        Medication Adjustments/Labs and Tests Ordered: Current medicines are reviewed at length with the patient today.  Concerns regarding medicines are outlined above.  Medication changes, Labs and Tests ordered today are listed in the Patient Instructions below. Patient Instructions  Medication Instructions:  Your physician has recommended you make the following change in your medication:   INCREASE the Imdur to 60 mg taking 1 1/2 tablet daily have sent in a new prescription to [facility] on [address]  *If you need a refill on your cardiac medications before your next appointment, please call your pharmacy*   Lab Work: TODAY:  BMET & CBC  If you have labs (blood work) drawn today and your tests are completely normal, you will receive your results only by: [place] (if you have MyChart) OR A paper copy in the mail If you have any lab test that is abnormal or we need to change your treatment, we will call you to review the results.   Testing/Procedures: None ordered   Follow-Up: At [facility], you and your health needs are our priority.  As part of our continuing mission to provide you with exceptional heart care, we have created designated Provider Care Teams.  These Care Teams include  your primary Cardiologist (physician) and Advanced Practice Providers (APPs -  Physician Assistants and  Nurse Practitioners) who all work together to provide you with the care you need, when you need it.  We recommend signing up for the patient portal called "MyChart".  Sign up information is provided on this After Visit Summary.  MyChart is used to connect with patients for Virtual Visits (Telemedicine).  Patients are able to view lab/test results, encounter notes, upcoming appointments, etc.  Non-urgent messages can be sent to your provider as well.   To learn more about what you can do with MyChart, go to [email or web address].    Your next appointment:   03/27/2021 arrive at 8:45 to see Dr. Johney Frame  The format for your next appointment:   In Person  Provider:   Freada Bergeron, MD     Other Instructions  Dr. Vivi Martens office: TODAY: APPOINTMENT AT 9:30  [facility] [id number], [place], [place] [address] Phone: [phone]  Call [facility] to make a follow-up appointment [phone]    Signed, Ermalinda Barrios, PA-C  02/13/2021 9:04 AM    [address], [place], [place]  [address] Phone: [phone]; Fax: [phone]

## 2021-02-07 ENCOUNTER — Other Ambulatory Visit (HOSPITAL_COMMUNITY): Payer: Self-pay

## 2021-02-07 ENCOUNTER — Inpatient Hospital Stay: Payer: No Typology Code available for payment source | Admitting: Critical Care Medicine

## 2021-02-07 NOTE — Progress Notes (Deleted)
New Patient Office Visit  Subjective:  Patient ID: [name], male    DOB: [birth year]  Age: 60 y.o. MRN: [id number]  CC: No chief complaint on file.   HPI [name] presents for  Foot, flu tdap pcv eye urine alb hcv colon   Admit date: 12/24/2020 Discharge date: 01/01/2021   Admitted From: home Discharge disposition: home     Recommendations for Outpatient Follow-Up:    Follow up with CVTS for eventual CABG Follow up with cards for BMP and adjustment of BP meds-- goal 140s To establish with PCP for adjustment of insulin ASA 325 plus plavix for 3 months then ASA alone Outpatient thyroid U/S Needs outpatient sleep study     Discharge Diagnosis:    Active Problems:   Chest pain   AKI (acute kidney injury) ([place])   Cerebral embolism with cerebral infarction       Discharge Condition: Improved.   Diet recommendation: Low sodium, heart healthy.  Carbohydrate-modified.     Wound care: None.   Code status: Full.     History of Present Illness:    [name] is a 60 y.o. male with medical history significant of DM2, HTN, CAD s/p PCI, HLD, tobacco abuse, EtOH abuse. Presenting with chest pain. Symptoms started one month ago. He had a midsternal chest tightness/achiness along w/ dyspnea. It felt like he "just ran a sprint w/ a dull ache" preventing him from catching his breath. It felt similar to his first heart attack. These episodes would come in waves lasting 4 - 10 minutes. When they happened, he would try to get still and focus on his breathing. He didn't have any medications to try. He didn't have any syncopal episodes with these events. He has noticed an increase in frequency of these episodes over the last several days. He had an episode that lasted for an hour this morning, so he decided to come to the ED.    ED Course: He was found to be hypertensive. He was started on NTG gtt. His trp were elevated. He was started on heparin gtt. Cardiology was consulted. [facility] was  called for admission.      Hospital Course by Problem:    CVA -only complaint is speech difficulty -MRI:  1.Large acute infarct of the superior right cerebellum with mild surrounding edema and petechial hemorrhage. 2. Multiple scattered punctate acute infarcts in multiple vascular territories, consistent with a central cardiac or aortic embolic source. 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 -neurology consult appreciated -PT/OT-no follow up -statin -goal HbA1c: <7 -goal SBP around 140 for now -ASA/plavix x 3 months then ASA alone   NSTEMI     - Cath showed 90% prox-mid RCA, 70-80% RV marginal branches and mild distal RCA disease. Unable to engage left system on initial cath and therefore repeat cath performed 11/16 which showed severe multivessel CAD and was recommended for CABG but plans on hold due to CVA -outpatient plan for CABG once recovered   HTN urgency                 - coreg decreased due to bradycardia             -norvsac added and increased             -imdur added             - prn lasix             -goal SBP 140s   AKI? Vs CKD     -  renal US unremarkable     - hold lisinopril; watch nephrotoxins             -outpatient follow up   DM2     -A1C is 9/9-- improved from 5 years ago -goal of <7 -insulin prescribed, follow up outpatient    HLD/CAD     - ASA, statin, BB   Tobacco abuse/EtOH abuse/Marijuana abuse     - counseled against further use     - no sign of withdrawal   Suspected sleep apnea -needs outpatient sleep study   obesity Body mass index is 34.3 kg/m.   Enlarged right thyroid lobe with multiple hypoattenuating nodules. This has not previously been evaluated, an ultrasound of the thyroid is recommended Past Medical History:  Diagnosis Date   CAD (coronary artery disease)    a. 03/2015 NSTEMI: LHC with severe 3V CAD  (70% mid RCA, 95% OM1, 90% distal LCx, 90% OM3, 80% prox LAD and 90% ost D1) s/p DES to mLAD w/ small dissction Rx with DES,  staged ost Ramus PCI/DES and dLCx s/p PCI/DES    Diabetes mellitus type 2 in obese ([place])    Diverticulosis    Hypercholesteremia    Hypertension    Obesity    Tobacco abuse     Past Surgical History:  Procedure Laterality Date   CARDIAC CATHETERIZATION N/A 03/19/2015   Procedure: Left Heart Cath and Coronary Angiography;  Surgeon: Lorretta Harp, MD;  Location: [facility];  Service: Cardiovascular;  Laterality: N/A;   CARDIAC CATHETERIZATION N/A 03/20/2015   Procedure: Coronary Stent Intervention;  Surgeon: Lorretta Harp, MD;  Location: [facility];  Service: Cardiovascular;  Laterality: N/A;   CARDIAC CATHETERIZATION N/A 03/22/2015   Procedure: Coronary Stent Intervention;  Surgeon: Lorretta Harp, MD;  Location: [facility];  Service: Cardiovascular;  Laterality: N/A;   LEFT HEART CATH AND CORONARY ANGIOGRAPHY N/A 12/25/2020   Procedure: LEFT HEART CATH AND CORONARY ANGIOGRAPHY;  Surgeon: Troy Sine, MD;  Location: [facility];  Service: Cardiovascular;  Laterality: N/A;   LEFT HEART CATH AND CORONARY ANGIOGRAPHY N/A 12/26/2020   Procedure: LEFT HEART CATH AND CORONARY ANGIOGRAPHY;  Surgeon: Troy Sine, MD;  Location: [facility];  Service: Cardiovascular;  Laterality: N/A;    No family history on file.  Social History   Socioeconomic History   Marital status: Married    Spouse name: Not on file   Number of children: Not on file   Years of education: Not on file   Highest education level: Not on file  Occupational History   Not on file  Tobacco Use   Smoking status: Every Day    Packs/day: 0.50    Types: Cigarettes   Smokeless tobacco: Never  Vaping Use   Vaping Use: Never used  Substance and Sexual Activity   Alcohol use: Yes    Comment: weekend   Drug use: Yes    Types: Marijuana   Sexual activity: Not on file  Other Topics Concern   Not on file  Social History Narrative   Not on file   Social Determinants of  Health   Financial Resource Strain: Not on file  Food Insecurity: Not on file  Transportation Needs: Not on file  Physical Activity: Not on file  Stress: Not on file  Social Connections: Not on file  Intimate Partner Violence: Not on file    ROS Review of Systems  Objective:  Today's Vitals: There were no vitals taken for this visit.  Physical Exam  Assessment & Plan:   Problem List Items Addressed This Visit   None   Outpatient Encounter Medications as of 02/07/2021  Medication Sig   amLODipine (NORVASC) 10 MG tablet Take 1 tablet (10 mg total) by mouth daily.   aspirin EC 325 MG EC tablet Take 1 tablet (325 mg total) by mouth daily.   atorvastatin (LIPITOR) 80 MG tablet Take 1 tablet (80 mg total) by mouth daily.   blood glucose meter kit and supplies Dispense based on patient and insurance preference. Use up to four times daily as directed. (FOR ICD-10 E10.9, E11.9).   carvedilol (COREG) 6.25 MG tablet Take 3 tablets (18.75 mg total) by mouth 2 (two) times daily with a meal.   clopidogrel (PLAVIX) 75 MG tablet Take 1 tablet (75 mg total) by mouth daily.   furosemide (LASIX) 20 MG tablet Take 1 tablet (20 mg total) by mouth daily as needed for edema.   insulin glargine-yfgn (SEMGLEE, YFGN,) 100 UNIT/ML Pen Inject 18 Units into the skin 2 (two) times daily.   isosorbide mononitrate (IMDUR) 60 MG 24 hr tablet Take 1 tablet (60 mg total) by mouth daily.   nitroGLYCERIN (NITROSTAT) 0.4 MG SL tablet Place 1 tablet (0.4 mg total) under the tongue every 5 (five) minutes as needed for chest pain (CP or SOB).   No facility-administered encounter medications on file as of 02/07/2021.    Follow-up: No follow-ups on file.   Asencion Noble, MD

## 2021-02-10 DIAGNOSIS — Z9281 Personal history of extracorporeal membrane oxygenation (ECMO): Secondary | ICD-10-CM

## 2021-02-10 HISTORY — DX: Personal history of extracorporeal membrane oxygenation (ECMO): Z92.81

## 2021-02-12 ENCOUNTER — Encounter: Payer: Self-pay | Admitting: *Deleted

## 2021-02-12 NOTE — Progress Notes (Unsigned)
Patient ID: [name], male   DOB: [birth year], 61 y.o.   MRN: [id number] Received notification from [facility] 01/10/21.  [employer] will be cancelling the study on [name].  Reason for cancellation was declined, non-compliant, inconvenience. Order will be cancelled.

## 2021-02-13 ENCOUNTER — Ambulatory Visit (INDEPENDENT_AMBULATORY_CARE_PROVIDER_SITE_OTHER): Payer: BC Managed Care – PPO | Admitting: Physician Assistant

## 2021-02-13 ENCOUNTER — Encounter: Payer: Self-pay | Admitting: Physician Assistant

## 2021-02-13 ENCOUNTER — Ambulatory Visit (INDEPENDENT_AMBULATORY_CARE_PROVIDER_SITE_OTHER): Payer: BC Managed Care – PPO | Admitting: Surgery

## 2021-02-13 ENCOUNTER — Other Ambulatory Visit: Payer: Self-pay

## 2021-02-13 ENCOUNTER — Encounter: Payer: Self-pay | Admitting: Surgery

## 2021-02-13 ENCOUNTER — Telehealth: Payer: Self-pay | Admitting: *Deleted

## 2021-02-13 VITALS — BP 122/82 | HR 100 | Resp 20 | Ht 67.0 in | Wt 216.0 lb

## 2021-02-13 VITALS — BP 130/86 | HR 98 | Ht 67.0 in | Wt 216.6 lb

## 2021-02-13 DIAGNOSIS — I10 Essential (primary) hypertension: Secondary | ICD-10-CM | POA: Diagnosis not present

## 2021-02-13 DIAGNOSIS — Z8673 Personal history of transient ischemic attack (TIA), and cerebral infarction without residual deficits: Secondary | ICD-10-CM

## 2021-02-13 DIAGNOSIS — Z720 Tobacco use: Secondary | ICD-10-CM

## 2021-02-13 DIAGNOSIS — I2510 Atherosclerotic heart disease of native coronary artery without angina pectoris: Secondary | ICD-10-CM | POA: Diagnosis not present

## 2021-02-13 DIAGNOSIS — E785 Hyperlipidemia, unspecified: Secondary | ICD-10-CM

## 2021-02-13 DIAGNOSIS — N179 Acute kidney failure, unspecified: Secondary | ICD-10-CM

## 2021-02-13 LAB — CBC
Hematocrit: 45.7 % (ref 37.5–51.0)
Hemoglobin: 15.4 g/dL (ref 13.0–17.7)
MCH: 29.5 pg (ref 26.6–33.0)
MCHC: 33.7 g/dL (ref 31.5–35.7)
MCV: 88 fL (ref 79–97)
Platelets: 239 10*3/uL (ref 150–450)
RBC: 5.22 x10E6/uL (ref 4.14–5.80)
RDW: 11.3 % — ABNORMAL LOW (ref 11.6–15.4)
WBC: 8.6 10*3/uL (ref 3.4–10.8)

## 2021-02-13 LAB — BASIC METABOLIC PANEL
BUN/Creatinine Ratio: 5 — ABNORMAL LOW (ref 10–24)
BUN: 9 mg/dL (ref 8–27)
CO2: 30 mmol/L — ABNORMAL HIGH (ref 20–29)
Calcium: 10.4 mg/dL — ABNORMAL HIGH (ref 8.6–10.2)
Chloride: 90 mmol/L — ABNORMAL LOW (ref 96–106)
Creatinine, Ser: 1.72 mg/dL — ABNORMAL HIGH (ref 0.76–1.27)
Glucose: 486 mg/dL — ABNORMAL HIGH (ref 70–99)
Potassium: 4.1 mmol/L (ref 3.5–5.2)
Sodium: 135 mmol/L (ref 134–144)
eGFR: 45 mL/min/{1.73_m2} — ABNORMAL LOW (ref >59)

## 2021-02-13 MED ORDER — ISOSORBIDE MONONITRATE ER 60 MG PO TB24: 90.0000 mg | ORAL_TABLET | Freq: Every day | ORAL | 3 refills | Status: DC

## 2021-02-13 NOTE — Patient Instructions (Addendum)
Medication Instructions:  Your physician has recommended you make the following change in your medication:   INCREASE the Imdur to 60 mg taking 1 1/2 tablet daily have sent in a new prescription to [facility] on [address]  *If you need a refill on your cardiac medications before your next appointment, please call your pharmacy*   Lab Work: TODAY:  BMET & CBC  If you have labs (blood work) drawn today and your tests are completely normal, you will receive your results only by: [place] (if you have MyChart) OR A paper copy in the mail If you have any lab test that is abnormal or we need to change your treatment, we will call you to review the results.   Testing/Procedures: None ordered   Follow-Up: At [facility], you and your health needs are our priority.  As part of our continuing mission to provide you with exceptional heart care, we have created designated Provider Care Teams.  These Care Teams include your primary Cardiologist (physician) and Advanced Practice Providers (APPs -  Physician Assistants and Nurse Practitioners) who all work together to provide you with the care you need, when you need it.  We recommend signing up for the patient portal called "MyChart".  Sign up information is provided on this After Visit Summary.  MyChart is used to connect with patients for Virtual Visits (Telemedicine).  Patients are able to view lab/test results, encounter notes, upcoming appointments, etc.  Non-urgent messages can be sent to your provider as well.   To learn more about what you can do with MyChart, go to [email or web address].    Your next appointment:   03/27/2021 arrive at 8:45 to see Dr. Johney Frame  The format for your next appointment:   In Person  Provider:   Freada Bergeron, MD     Other Instructions  Dr. Vivi Martens office: TODAY: APPOINTMENT AT 9:30  [address] [address], [place], [place] [address] Phone: [phone]  Call [facility] to  make a follow-up appointment [phone]

## 2021-02-13 NOTE — Progress Notes (Signed)
HPI:  The patient is a 61 year old gentleman with a history of coronary artery disease (previous stents), hypertension, hyperlipidemia, DM, marijuana, tobacco and alcohol abuse who presented and November 2022 with a NSTEMI.  An echocardiogram on 12/24/2020 showed an ejection fraction of 55 to 60% with no significant valvular abnormality.  Cardiac catheterization showed severe multivessel coronary disease with previous stents in the LAD and distal left circumflex.  Since he had three-vessel coronary disease and diabetes it was thought that coronary bypass surgery would be the best treatment option for him.  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  There were multiple scattered punctate acute infarcts in multiple vascular territories consistent with an embolic source.  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  He did well clinically without chest pain or shortness of breath and his only neurologic deficit appeared to be some difficulty with speech.  He was discharged home.  He saw cardiology this morning.  He has had some chest tightness with activity every couple days.  This was relieved with sublingual nitroglycerin.  He no longer smokes but has been vaping on occasion.  He said that he has still been drinking some vodka 2-3 times per week.  He has not noticed any weakness or difficulty with balance.  He still has some slurred speech at times and difficulty calling up words.  An echo with bubble study after his stroke showed no evidence of intracardiac shunt.  There is no sign of any thrombus inside his heart on his echo precatheterization  Current Outpatient Medications  Medication Sig Dispense Refill   amLODipine (NORVASC) 10 MG tablet Take 1 tablet (10 mg total) by mouth daily. 30 tablet 0   aspirin EC 325 MG EC tablet  Take 1 tablet (325 mg total) by mouth daily. 30 tablet 0   atorvastatin (LIPITOR) 80 MG tablet Take 1 tablet (80 mg total) by mouth daily. 30 tablet 0   blood glucose meter kit and supplies Dispense based on patient and insurance preference. Use up to four times daily as directed. (FOR ICD-10 E10.9, E11.9). 1 each 0   carvedilol (COREG) 6.25 MG tablet Take 3 tablets (18.75 mg total) by mouth 2 (two) times daily with a meal. 60 tablet 0   clopidogrel (PLAVIX) 75 MG tablet Take 1 tablet (75 mg total) by mouth daily. 30 tablet 2   furosemide (LASIX) 20 MG tablet Take 1 tablet (20 mg total) by mouth daily as needed for edema. 30 tablet 0   insulin glargine-yfgn (SEMGLEE, YFGN,) 100 UNIT/ML Pen Inject 18 Units into the skin 2 (two) times daily. 15 mL 0   isosorbide mononitrate (IMDUR) 60 MG 24 hr tablet Take 1.5 tablets (90 mg total) by mouth daily. 135 tablet 3   nitroGLYCERIN (NITROSTAT) 0.4 MG SL tablet Place 1 tablet (0.4 mg total) under the tongue every 5 (five) minutes as needed for chest pain (CP or SOB). 25 tablet 12   No current facility-administered medications for this visit.     Physical Exam: BP 122/82    Pulse 100    Resp 20    Ht _0  (1.702 m)    Wt 216 lb (98 kg)    SpO2 95% Comment: RA   BMI 33.83 kg/m  He looks well. Cardiac exam shows regular rate and rhythm with normal heart sounds.  There  is no murmur. Lungs are clear. There is no peripheral edema.  Impression:  This 61 year old diabetic gentleman has severe three-vessel coronary disease with normal left ventricular function and suffered a stroke after his catheterization.  He continues to have episodes of angina every few days at home without much activity relieved with nitroglycerin.  I agree that coronary artery bypass graft surgery is the best treatment for him.  He is at increased risk for perioperative stroke given his history of recent stroke after catheterization.  CTA of the chest shows some calcifications in the  aortic arch but I do not see any significant bulky atheroma on CT.  I reviewed the cardiac catheterization and CT images with him and answered all of his questions.  He would like to wait until the end of January to do surgery which should give him an adequate amount of time to recover from his stroke. I discussed the operative procedure of coronary artery bypass graft surgery with the patient including alternatives, benefits and risks; including but not limited to bleeding, blood transfusion, infection, stroke, myocardial infarction, graft failure, heart block requiring a permanent pacemaker, organ dysfunction, and death.  Wonda Amis understands and agrees to proceed.    Plan:  He will call us to schedule coronary artery bypass graft surgery towards the end of January.  I spent  minutes performing this established patient evaluation and > 50% of this time was spent face to face counseling and coordinating the care of this patient's severe multivessel coronary disease.   [name], MD [facility] [phone]

## 2021-02-13 NOTE — Telephone Encounter (Signed)
No answer and mailbox full

## 2021-02-14 ENCOUNTER — Telehealth (HOSPITAL_COMMUNITY): Payer: Self-pay | Admitting: Licensed Clinical Social Worker

## 2021-02-14 NOTE — Telephone Encounter (Signed)
CSW received referral to assist patient with PCP visit as he does not have one and has some ongoing medical concerns requiring intervention from a PCP. CSW reached out to patient and unable to leave a message as mailbox is full. CSW sent a message to explore possible options for an appointment with [place] and waiting to hear back. Raquel Sarna, [place], [place]

## 2021-02-15 ENCOUNTER — Telehealth (HOSPITAL_COMMUNITY): Payer: Self-pay | Admitting: Licensed Clinical Social Worker

## 2021-02-15 NOTE — Telephone Encounter (Signed)
CSW attempted to contact patient to inform of Primary care appointment obtained for him to follow up regarding his diabetes. Patient's appointment is at [facility] on Tues 1/10 @ 0900 with Dr Redmond Pulling. CSW unable to leave message as mailbox is full. CSW sent a text message with no response. CSW will await retrun. Raquel Sarna, [place], [place]

## 2021-02-18 ENCOUNTER — Telehealth (HOSPITAL_COMMUNITY): Payer: Self-pay | Admitting: Licensed Clinical Social Worker

## 2021-02-18 NOTE — Telephone Encounter (Signed)
CSW has made multiple attempts to reach patient to inform of PCP appointment made for him at [facility] tomorrow. Unfortunately, patient's phone goes to voicemail and mailbox is full. Raquel Sarna, [place], [place]

## 2021-02-18 NOTE — Telephone Encounter (Signed)
CSW again attempted to reach patient to inform of PCP appointment with no success. Patient's number goes straight to voicemail and mailbox is full unable to leave a message. Raquel Sarna, [place], [place]

## 2021-02-18 NOTE — Telephone Encounter (Signed)
No answer and mailbox full

## 2021-02-18 NOTE — Telephone Encounter (Signed)
CSW received return call from patient and was informed of the PCP visit at [place] morning. Patient verbalizes understanding and follow up. Patient states he will call CSW back tomorrow to provide update. Raquel Sarna, [place], [place]

## 2021-02-19 ENCOUNTER — Other Ambulatory Visit (HOSPITAL_COMMUNITY): Payer: Self-pay

## 2021-02-19 ENCOUNTER — Other Ambulatory Visit: Payer: Self-pay

## 2021-02-19 ENCOUNTER — Ambulatory Visit (INDEPENDENT_AMBULATORY_CARE_PROVIDER_SITE_OTHER): Payer: BC Managed Care – PPO | Admitting: Family Medicine

## 2021-02-19 ENCOUNTER — Encounter: Payer: Self-pay | Admitting: Family Medicine

## 2021-02-19 VITALS — BP 104/71 | HR 96 | Ht 67.0 in | Wt 213.1 lb

## 2021-02-19 DIAGNOSIS — Z794 Long term (current) use of insulin: Secondary | ICD-10-CM | POA: Diagnosis not present

## 2021-02-19 DIAGNOSIS — I10 Essential (primary) hypertension: Secondary | ICD-10-CM | POA: Diagnosis not present

## 2021-02-19 DIAGNOSIS — I2510 Atherosclerotic heart disease of native coronary artery without angina pectoris: Secondary | ICD-10-CM

## 2021-02-19 DIAGNOSIS — E785 Hyperlipidemia, unspecified: Secondary | ICD-10-CM

## 2021-02-19 DIAGNOSIS — E1169 Type 2 diabetes mellitus with other specified complication: Secondary | ICD-10-CM | POA: Diagnosis not present

## 2021-02-19 DIAGNOSIS — Z7689 Persons encountering health services in other specified circumstances: Secondary | ICD-10-CM

## 2021-02-19 LAB — POCT GLYCOSYLATED HEMOGLOBIN (HGB A1C): HbA1c POC (<> result, manual entry): >15 % (ref 4.0–5.6)

## 2021-02-19 NOTE — Progress Notes (Signed)
New Patient Office Visit  Subjective:  Patient ID: [name], male    DOB: 02-Sep-1960  Age: 61 y.o. MRN: [id number]  CC:  Chief Complaint  Patient presents with   Diabetes    HPI [name] presents for to establish care. Patient reports that he originally was scheduling for follow up from hospital visit in November 2022 for which was recommended control of his diabetes, referral for sleep study and thyroid ultrasound. However patient reports that he recently scheduled his open heart surgery for asap and afterwards found out that he would not have insurance soon 2/2 employment. His surgery now has highest priority and he would like to have that done while he still has insurance.   Past Medical History:  Diagnosis Date   CAD (coronary artery disease)    a. 03/2015 NSTEMI: LHC with severe 3V CAD  (70% mid RCA, 95% OM1, 90% distal LCx, 90% OM3, 80% prox LAD and 90% ost D1) s/p DES to mLAD w/ small dissction Rx with DES, staged ost Ramus PCI/DES and dLCx s/p PCI/DES    Diabetes mellitus type 2 in obese ([place])    Diverticulosis    Hypercholesteremia    Hypertension    Obesity    Tobacco abuse     Past Surgical History:  Procedure Laterality Date   CARDIAC CATHETERIZATION N/A 03/19/2015   Procedure: Left Heart Cath and Coronary Angiography;  Surgeon: Lorretta Harp, MD;  Location: [place] [facility];  Service: Cardiovascular;  Laterality: N/A;   CARDIAC CATHETERIZATION N/A 03/20/2015   Procedure: Coronary Stent Intervention;  Surgeon: Lorretta Harp, MD;  Location: [place] [facility];  Service: Cardiovascular;  Laterality: N/A;   CARDIAC CATHETERIZATION N/A 03/22/2015   Procedure: Coronary Stent Intervention;  Surgeon: Lorretta Harp, MD;  Location: [place] [facility];  Service: Cardiovascular;  Laterality: N/A;   LEFT HEART CATH AND CORONARY ANGIOGRAPHY N/A 12/25/2020   Procedure: LEFT HEART CATH AND CORONARY ANGIOGRAPHY;  Surgeon: Troy Sine, MD;  Location: [place] [facility];   Service: Cardiovascular;  Laterality: N/A;   LEFT HEART CATH AND CORONARY ANGIOGRAPHY N/A 12/26/2020   Procedure: LEFT HEART CATH AND CORONARY ANGIOGRAPHY;  Surgeon: Troy Sine, MD;  Location: [place] [facility];  Service: Cardiovascular;  Laterality: N/A;    No family history on file.  Social History   Socioeconomic History   Marital status: Married    Spouse name: Not on file   Number of children: Not on file   Years of education: Not on file   Highest education level: Not on file  Occupational History   Not on file  Tobacco Use   Smoking status: Every Day    Packs/day: 0.50    Types: Cigarettes   Smokeless tobacco: Never  Vaping Use   Vaping Use: Never used  Substance and Sexual Activity   Alcohol use: Yes    Comment: weekend   Drug use: Yes    Types: Marijuana   Sexual activity: Not on file  Other Topics Concern   Not on file  Social History Narrative   Not on file   Social Determinants of Health   Financial Resource Strain: Not on file  Food Insecurity: Not on file  Transportation Needs: Not on file  Physical Activity: Not on file  Stress: Not on file  Social Connections: Not on file  Intimate Partner Violence: Not on file    ROS Review of Systems  Cardiovascular: Negative.   All  other systems reviewed and are negative.  Objective:   Today's Vitals: BP 104/71    Pulse 96    Ht _0  (1.702 m)    Wt 213 lb 2 oz (96.7 kg)    SpO2 94%    BMI 33.38 kg/m   Physical Exam Vitals and nursing note reviewed.  Constitutional:      General: He is not in acute distress.    Appearance: He is obese.  Cardiovascular:     Rate and Rhythm: Normal rate and regular rhythm.  Pulmonary:     Effort: Pulmonary effort is normal.     Breath sounds: Normal breath sounds.  Abdominal:     Palpations: Abdomen is soft.     Tenderness: There is no abdominal tenderness.  Musculoskeletal:     Right lower leg: No edema.     Left lower leg: No edema.  Neurological:      General: No focal deficit present.     Mental Status: He is alert and oriented to person, place, and time.    Assessment & Plan:   1. Type 2 diabetes mellitus with other specified complication, with long-term current use of insulin (HCC) Greatly elevated hgbA1c. Patient wants to follow up with consultant regarding impending  heart surgery. Recommend patient follow up with [facility] for diabetic med management.   - POCT glycosylated hemoglobin (Hb A1C)  2. Coronary artery disease involving native coronary artery of native heart without angina pectoris Management as per consultant.   3. Essential hypertension Appears stable. Continue present management and monitor  4. Hyperlipidemia, unspecified hyperlipidemia type Continue present management  5. Encounter to establish care    Outpatient Encounter Medications as of 02/19/2021  Medication Sig   amLODipine (NORVASC) 10 MG tablet Take 1 tablet (10 mg total) by mouth daily.   aspirin EC 325 MG EC tablet Take 1 tablet (325 mg total) by mouth daily.   atorvastatin (LIPITOR) 80 MG tablet Take 1 tablet (80 mg total) by mouth daily.   blood glucose meter kit and supplies Dispense based on patient and insurance preference. Use up to four times daily as directed. (FOR ICD-10 E10.9, E11.9).   carvedilol (COREG) 6.25 MG tablet Take 3 tablets (18.75 mg total) by mouth 2 (two) times daily with a meal.   clopidogrel (PLAVIX) 75 MG tablet Take 1 tablet (75 mg total) by mouth daily.   furosemide (LASIX) 20 MG tablet Take 1 tablet (20 mg total) by mouth daily as needed for edema.   insulin glargine-yfgn (SEMGLEE, YFGN,) 100 UNIT/ML Pen Inject 18 Units into the skin 2 (two) times daily.   isosorbide mononitrate (IMDUR) 60 MG 24 hr tablet Take 1.5 tablets (90 mg total) by mouth daily.   nitroGLYCERIN (NITROSTAT) 0.4 MG SL tablet Place 1 tablet (0.4 mg total) under the tongue every 5 (five) minutes as needed for chest pain (CP or SOB).   No  facility-administered encounter medications on file as of 02/19/2021.    Follow-up: No follow-ups on file.   Becky Sax, MD

## 2021-02-20 ENCOUNTER — Encounter: Payer: Self-pay | Admitting: Family Medicine

## 2021-03-06 ENCOUNTER — Encounter: Payer: Self-pay | Admitting: *Deleted

## 2021-03-06 ENCOUNTER — Other Ambulatory Visit: Payer: Self-pay | Admitting: *Deleted

## 2021-03-06 DIAGNOSIS — I2510 Atherosclerotic heart disease of native coronary artery without angina pectoris: Secondary | ICD-10-CM

## 2021-03-08 ENCOUNTER — Telehealth: Payer: Self-pay

## 2021-03-08 ENCOUNTER — Other Ambulatory Visit: Payer: Self-pay

## 2021-03-08 NOTE — Telephone Encounter (Signed)
FMLA form competed and faxed to [employer]/ [phone] and [employer] [phone]. LOA begins 03/15/21 through 06/10/21 Form mailed to home address.

## 2021-03-11 ENCOUNTER — Emergency Department (HOSPITAL_COMMUNITY): Payer: BC Managed Care – PPO

## 2021-03-11 ENCOUNTER — Observation Stay (HOSPITAL_COMMUNITY)
Admission: EM | Admit: 2021-03-11 | Discharge: 2021-03-14 | Disposition: A | Discharge status: Home / Self Care | Payer: BC Managed Care – PPO | Attending: Family Medicine | Admitting: Family Medicine

## 2021-03-11 DIAGNOSIS — I10 Essential (primary) hypertension: Secondary | ICD-10-CM | POA: Diagnosis present

## 2021-03-11 DIAGNOSIS — R4701 Aphasia: Secondary | ICD-10-CM | POA: Diagnosis not present

## 2021-03-11 DIAGNOSIS — R739 Hyperglycemia, unspecified: Secondary | ICD-10-CM | POA: Diagnosis not present

## 2021-03-11 DIAGNOSIS — E049 Nontoxic goiter, unspecified: Secondary | ICD-10-CM | POA: Insufficient documentation

## 2021-03-11 DIAGNOSIS — E1159 Type 2 diabetes mellitus with other circulatory complications: Secondary | ICD-10-CM | POA: Diagnosis present

## 2021-03-11 DIAGNOSIS — F17210 Nicotine dependence, cigarettes, uncomplicated: Secondary | ICD-10-CM | POA: Diagnosis not present

## 2021-03-11 DIAGNOSIS — I6611 Occlusion and stenosis of right anterior cerebral artery: Secondary | ICD-10-CM | POA: Diagnosis not present

## 2021-03-11 DIAGNOSIS — Z7982 Long term (current) use of aspirin: Secondary | ICD-10-CM | POA: Diagnosis not present

## 2021-03-11 DIAGNOSIS — G9389 Other specified disorders of brain: Secondary | ICD-10-CM

## 2021-03-11 DIAGNOSIS — Z20822 Contact with and (suspected) exposure to covid-19: Secondary | ICD-10-CM | POA: Insufficient documentation

## 2021-03-11 DIAGNOSIS — E1169 Type 2 diabetes mellitus with other specified complication: Secondary | ICD-10-CM | POA: Diagnosis not present

## 2021-03-11 DIAGNOSIS — E1165 Type 2 diabetes mellitus with hyperglycemia: Secondary | ICD-10-CM | POA: Diagnosis not present

## 2021-03-11 DIAGNOSIS — R9431 Abnormal electrocardiogram [ECG] [EKG]: Secondary | ICD-10-CM | POA: Diagnosis not present

## 2021-03-11 DIAGNOSIS — R29818 Other symptoms and signs involving the nervous system: Secondary | ICD-10-CM

## 2021-03-11 DIAGNOSIS — R4702 Dysphasia: Secondary | ICD-10-CM | POA: Diagnosis not present

## 2021-03-11 DIAGNOSIS — Z955 Presence of coronary angioplasty implant and graft: Secondary | ICD-10-CM | POA: Diagnosis not present

## 2021-03-11 DIAGNOSIS — I639 Cerebral infarction, unspecified: Secondary | ICD-10-CM | POA: Diagnosis present

## 2021-03-11 DIAGNOSIS — I5030 Unspecified diastolic (congestive) heart failure: Secondary | ICD-10-CM | POA: Diagnosis not present

## 2021-03-11 DIAGNOSIS — Z79899 Other long term (current) drug therapy: Secondary | ICD-10-CM | POA: Insufficient documentation

## 2021-03-11 DIAGNOSIS — Z720 Tobacco use: Secondary | ICD-10-CM | POA: Diagnosis present

## 2021-03-11 DIAGNOSIS — Z794 Long term (current) use of insulin: Secondary | ICD-10-CM | POA: Diagnosis not present

## 2021-03-11 DIAGNOSIS — I651 Occlusion and stenosis of basilar artery: Secondary | ICD-10-CM | POA: Diagnosis not present

## 2021-03-11 DIAGNOSIS — I130 Hypertensive heart and chronic kidney disease with heart failure and stage 1 through stage 4 chronic kidney disease, or unspecified chronic kidney disease: Secondary | ICD-10-CM | POA: Diagnosis not present

## 2021-03-11 DIAGNOSIS — E041 Nontoxic single thyroid nodule: Secondary | ICD-10-CM

## 2021-03-11 DIAGNOSIS — E785 Hyperlipidemia, unspecified: Secondary | ICD-10-CM | POA: Insufficient documentation

## 2021-03-11 DIAGNOSIS — Z8679 Personal history of other diseases of the circulatory system: Secondary | ICD-10-CM

## 2021-03-11 DIAGNOSIS — I6503 Occlusion and stenosis of bilateral vertebral arteries: Secondary | ICD-10-CM | POA: Diagnosis not present

## 2021-03-11 DIAGNOSIS — I2510 Atherosclerotic heart disease of native coronary artery without angina pectoris: Secondary | ICD-10-CM | POA: Insufficient documentation

## 2021-03-11 DIAGNOSIS — E1149 Type 2 diabetes mellitus with other diabetic neurological complication: Secondary | ICD-10-CM | POA: Diagnosis present

## 2021-03-11 DIAGNOSIS — N1831 Chronic kidney disease, stage 3a: Secondary | ICD-10-CM | POA: Diagnosis not present

## 2021-03-11 DIAGNOSIS — I6523 Occlusion and stenosis of bilateral carotid arteries: Secondary | ICD-10-CM | POA: Diagnosis not present

## 2021-03-11 DIAGNOSIS — R4781 Slurred speech: Secondary | ICD-10-CM | POA: Diagnosis not present

## 2021-03-11 HISTORY — DX: Acute kidney failure, unspecified: N17.9

## 2021-03-11 LAB — COMPREHENSIVE METABOLIC PANEL
ALT: 14 U/L (ref 0–44)
AST: 15 U/L (ref 15–41)
Albumin: 3.6 g/dL (ref 3.5–5.0)
Alkaline Phosphatase: 177 U/L — ABNORMAL HIGH (ref 38–126)
Anion gap: 13 (ref 5–15)
BUN: 10 mg/dL (ref 6–20)
CO2: 27 mmol/L (ref 22–32)
Calcium: 9.3 mg/dL (ref 8.9–10.3)
Chloride: 89 mmol/L — ABNORMAL LOW (ref 98–111)
Creatinine, Ser: 1.46 mg/dL — ABNORMAL HIGH (ref 0.61–1.24)
GFR, Estimated: 55 mL/min — ABNORMAL LOW (ref >60)
Glucose, Bld: 545 mg/dL (ref 70–99)
Potassium: 3.1 mmol/L — ABNORMAL LOW (ref 3.5–5.1)
Sodium: 129 mmol/L — ABNORMAL LOW (ref 135–145)
Total Bilirubin: 1.1 mg/dL (ref 0.3–1.2)
Total Protein: 7.4 g/dL (ref 6.5–8.1)

## 2021-03-11 LAB — I-STAT CHEM 8, ED
BUN: 11 mg/dL (ref 6–20)
Calcium, Ion: 1.06 mmol/L — ABNORMAL LOW (ref 1.15–1.40)
Chloride: 91 mmol/L — ABNORMAL LOW (ref 98–111)
Creatinine, Ser: 1.3 mg/dL — ABNORMAL HIGH (ref 0.61–1.24)
Glucose, Bld: 578 mg/dL (ref 70–99)
HCT: 49 % (ref 39.0–52.0)
Hemoglobin: 16.7 g/dL (ref 13.0–17.0)
Potassium: 3.1 mmol/L — ABNORMAL LOW (ref 3.5–5.1)
Sodium: 131 mmol/L — ABNORMAL LOW (ref 135–145)
TCO2: 30 mmol/L (ref 22–32)

## 2021-03-11 LAB — CBC
HCT: 45 % (ref 39.0–52.0)
Hemoglobin: 16 g/dL (ref 13.0–17.0)
MCH: 29.4 pg (ref 26.0–34.0)
MCHC: 35.6 g/dL (ref 30.0–36.0)
MCV: 82.7 fL (ref 80.0–100.0)
Platelets: 225 10*3/uL (ref 150–400)
RBC: 5.44 MIL/uL (ref 4.22–5.81)
RDW: 11.9 % (ref 11.5–15.5)
WBC: 7.7 10*3/uL (ref 4.0–10.5)
nRBC: 0 % (ref 0.0–0.2)

## 2021-03-11 LAB — PROTIME-INR
INR: 0.9 (ref 0.8–1.2)
Prothrombin Time: 12 seconds (ref 11.4–15.2)

## 2021-03-11 LAB — DIFFERENTIAL
Abs Immature Granulocytes: 0.06 10*3/uL (ref 0.00–0.07)
Basophils Absolute: 0.1 10*3/uL (ref 0.0–0.1)
Basophils Relative: 1 %
Eosinophils Absolute: 0.2 10*3/uL (ref 0.0–0.5)
Eosinophils Relative: 2 %
Immature Granulocytes: 1 %
Lymphocytes Relative: 36 %
Lymphs Abs: 2.7 10*3/uL (ref 0.7–4.0)
Monocytes Absolute: 0.7 10*3/uL (ref 0.1–1.0)
Monocytes Relative: 9 %
Neutro Abs: 4 10*3/uL (ref 1.7–7.7)
Neutrophils Relative %: 51 %

## 2021-03-11 LAB — CBG MONITORING, ED
Glucose-Capillary: 546 mg/dL (ref 70–99)
Glucose-Capillary: 425 mg/dL — ABNORMAL HIGH (ref 70–99)

## 2021-03-11 LAB — RESP PANEL BY RT-PCR (FLU A&B, COVID) ARPGX2
Influenza A by PCR: NEGATIVE
Influenza B by PCR: NEGATIVE
SARS Coronavirus 2 by RT PCR: NEGATIVE

## 2021-03-11 LAB — APTT: aPTT: 22 seconds — ABNORMAL LOW (ref 24–36)

## 2021-03-11 IMAGING — CT CT HEAD CODE STROKE
4 series · 15 of 47 positions shown, 17 images · non-contrast
Comparison: [DATE] CT, correlation is also made with [DATE]
MRI
COMPARISON: [DATE] CT, correlation is also made with [DATE]
MRI

Addendum:
CLINICAL DATA: Code stroke.  Slurred speech, hypertension



[Series 3: head wo · axial · 0.44mm/px · z∈[+1026,+1146]mm · 7 of 34 slices shown, 9 images]
[im 5/34  brain]
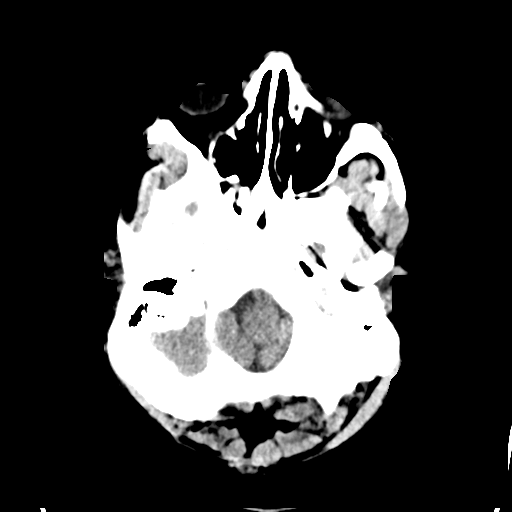
[im 5/34  bone]
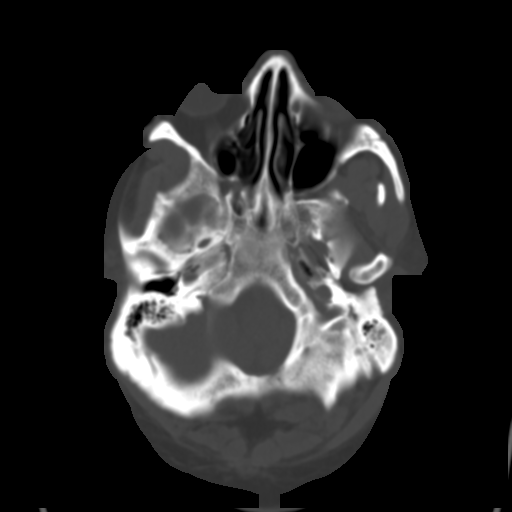
[im 9/34  brain]
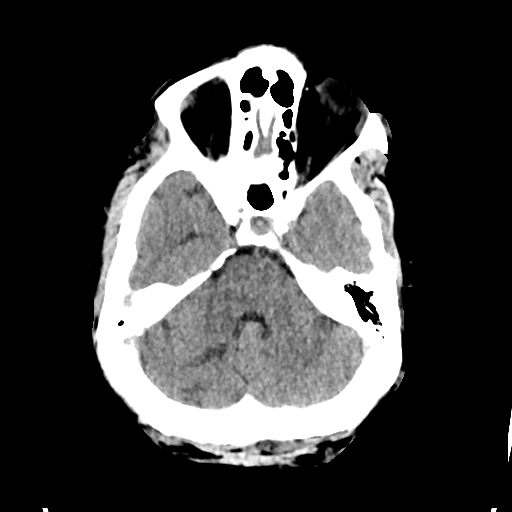
[im 13/34  brain]
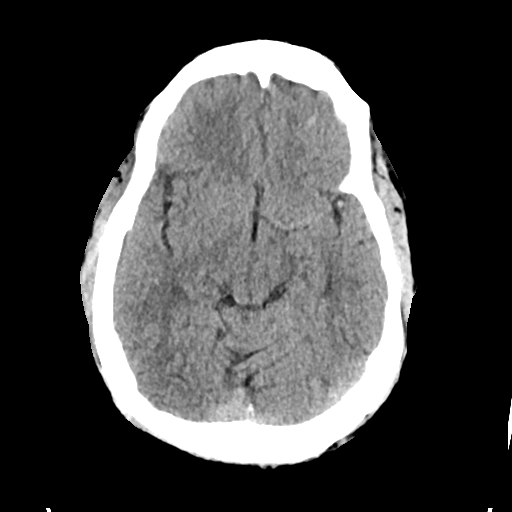
[im 17/34  brain]
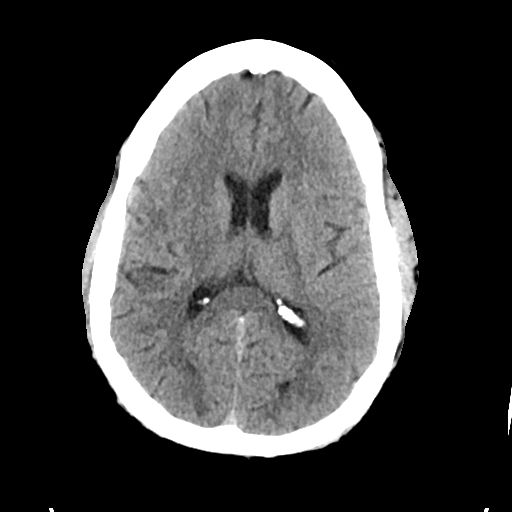
[im 21/34  brain]
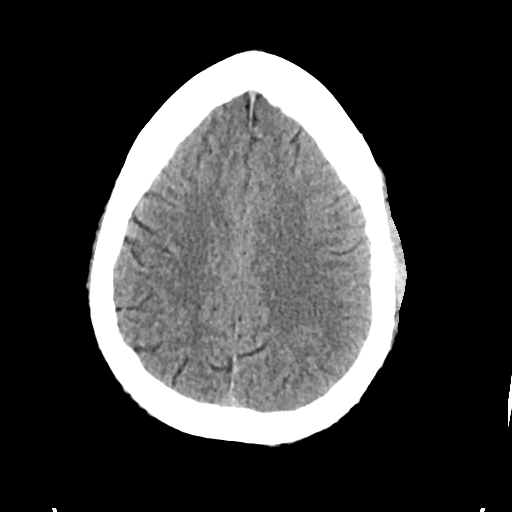
[im 21/34  bone]
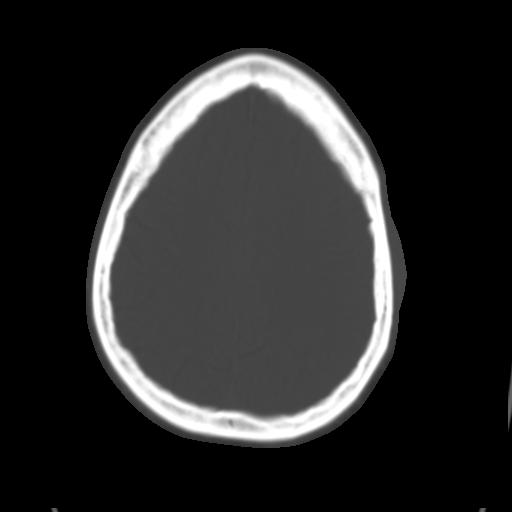
[im 25/34  brain]
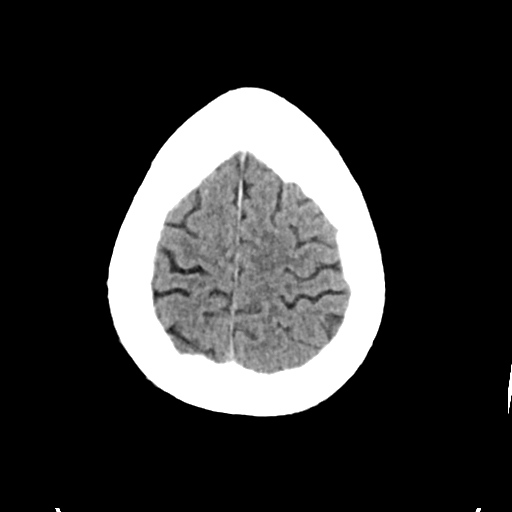
[im 29/34  brain]
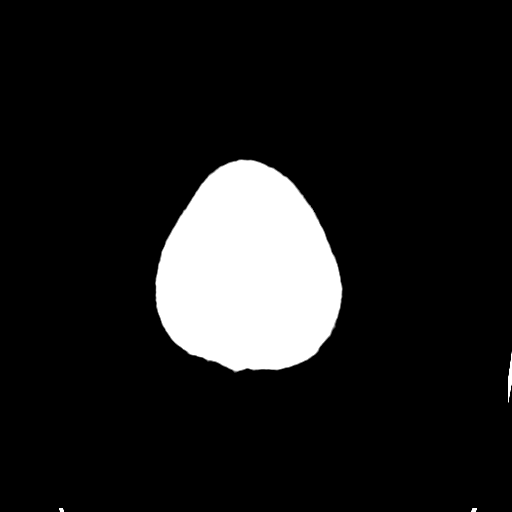

[Series 4: head bone · axial · 0.44mm/px · z∈[+1022,+1038]mm · 2 of 84 slices shown]
[im 9/84  bone]
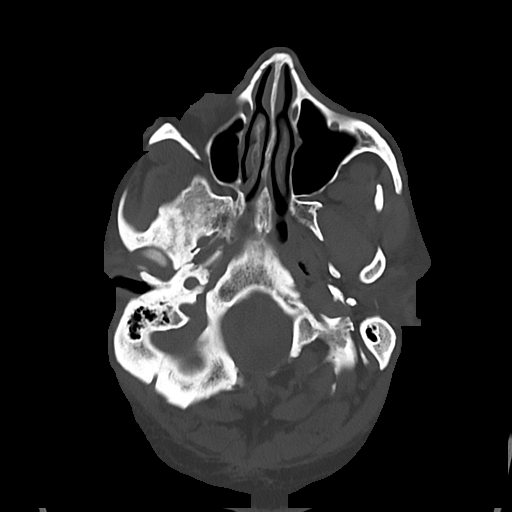
[im 17/84  bone]
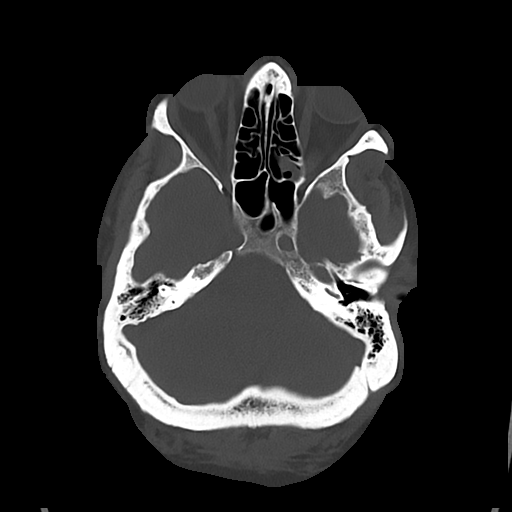

[Series 5: cor soft · coronal · 0.33mm/px · 3 of 71 slices shown]
[im 24/71  brain]
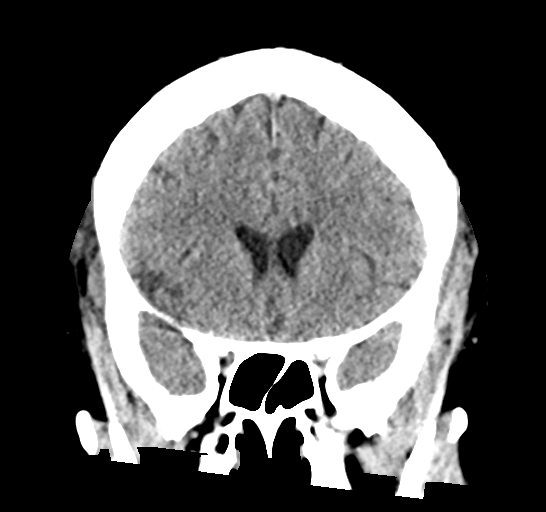
[im 32/71  brain]
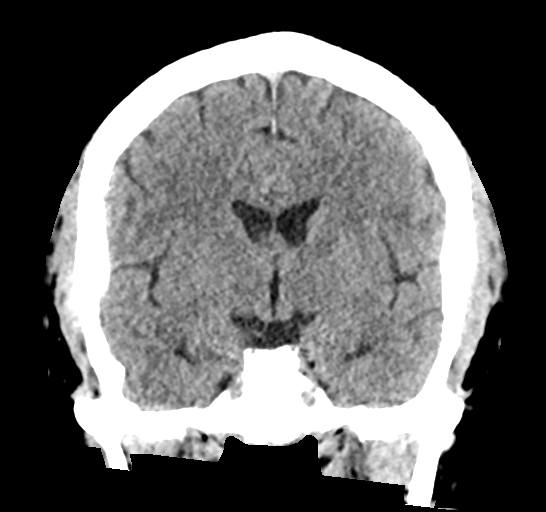
[im 39/71  brain]
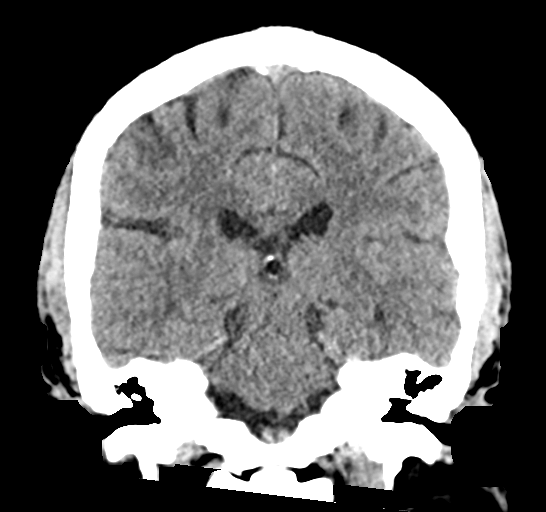

[Series 6: sag soft · sagittal · 0.33mm/px · 3 of 60 slices shown]
[im 21/60  brain]
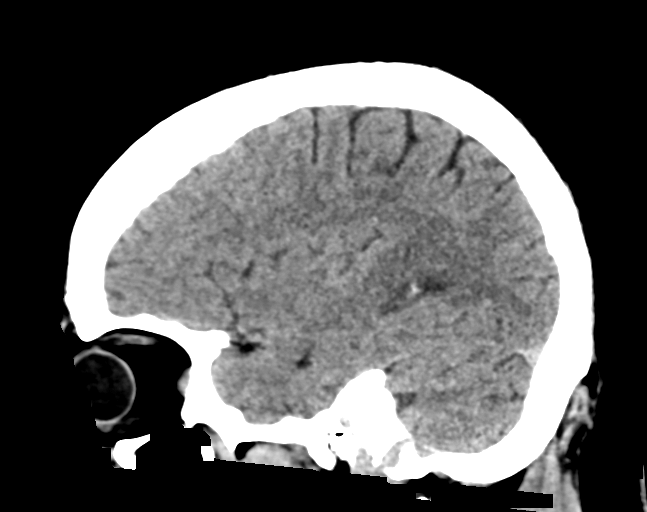
[im 30/60  brain]
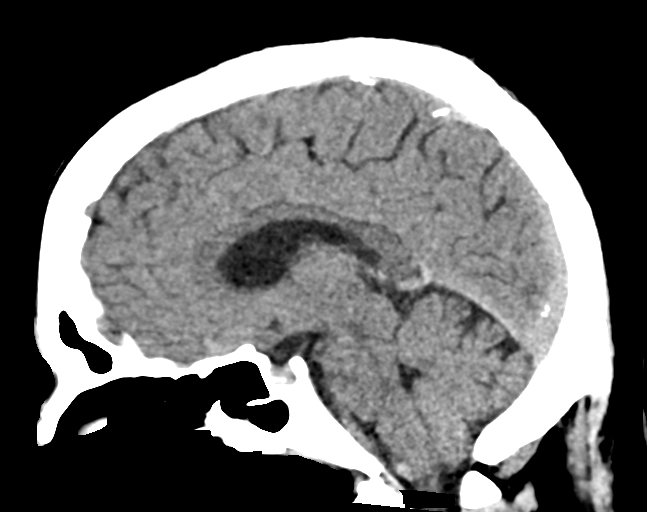
[im 39/60  brain]
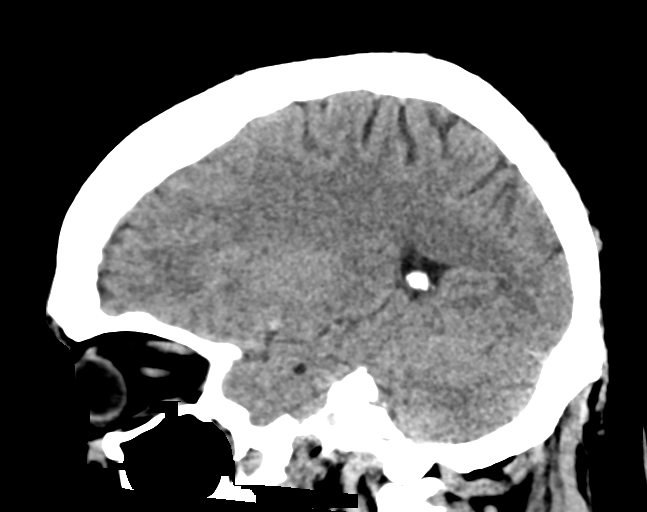

[15 of 47 positions shown; findings below may reference images not displayed]

FINDINGS: Brain: No evidence of acute infarction, hemorrhage, cerebral edema,
mass, mass effect, or midline shift. Hypodensity in the right
superior cerebellum, consistent with evolution of the previously
noted right cerebellar infarct on [DATE]. Ventricles and sulci
are normal for age. No extra-axial fluid collection.

Vascular: No hyperdense vessel or unexpected calcification.

Skull: Normal. Negative for fracture or focal lesion.

Mucosal thickening in the posterior left ethmoid air cells.
Otherwise negative.: No acute finding.

Other: The mastoid air cells are well aerated.

ASPECTS (Alberta Stroke Program Early CT Score)

- Ganglionic level infarction (caudate, lentiform nuclei, internal
capsule, insula, M1-M3 cortex): 7

- Supraganglionic infarction (M4-M6 cortex): 3

Total score (0-10 with 10 being normal): 10
IMPRESSION: 1. No acute intracranial process.
2. ASPECTS is 10
3. Encephalomalacia in the superior right cerebellum, consistent
with expected evolution of previously noted right cerebellar
infarct.

ADDENDUM:
Hyperdense focus in the left sylvian fissure (series 3, image 13),
concerning for thrombus in the left M2.

Initial code stroke imaging results were communicated on [DATE]
at [DATE] to provider Dr. GIORGI via secure text paging.
Addendum was discussed with him at [DATE] p.m. on [DATE] by
telephone.

*** End of Addendum ***
FINDINGS: Brain: No evidence of acute infarction, hemorrhage, cerebral edema,
mass, mass effect, or midline shift. Hypodensity in the right
superior cerebellum, consistent with evolution of the previously
noted right cerebellar infarct on [DATE]. Ventricles and sulci
are normal for age. No extra-axial fluid collection.

Vascular: No hyperdense vessel or unexpected calcification.

Skull: Normal. Negative for fracture or focal lesion.

Mucosal thickening in the posterior left ethmoid air cells.
Otherwise negative.: No acute finding.

Other: The mastoid air cells are well aerated.

ASPECTS (Alberta Stroke Program Early CT Score)

- Ganglionic level infarction (caudate, lentiform nuclei, internal
capsule, insula, M1-M3 cortex): 7

- Supraganglionic infarction (M4-M6 cortex): 3

Total score (0-10 with 10 being normal): 10
IMPRESSION: 1. No acute intracranial process.
2. ASPECTS is 10
3. Encephalomalacia in the superior right cerebellum, consistent
with expected evolution of previously noted right cerebellar
infarct.

## 2021-03-11 MED ORDER — SODIUM CHLORIDE 0.9 % IV SOLN
2000.0000 mg | Freq: Once | INTRAVENOUS | Status: AC
  Administered 2021-03-11: 2000 mg via INTRAVENOUS
  Filled 2021-03-11: qty 20

## 2021-03-11 MED ORDER — IOHEXOL 350 MG/ML SOLN
160.0000 mL | Freq: Once | INTRAVENOUS | Status: AC | PRN
  Administered 2021-03-11: 160 mL via INTRAVENOUS

## 2021-03-11 MED ORDER — SODIUM CHLORIDE 0.9% FLUSH
3.0000 mL | Freq: Once | INTRAVENOUS | Status: AC
  Administered 2021-03-11: 3 mL via INTRAVENOUS

## 2021-03-11 MED ORDER — INSULIN ASPART 100 UNIT/ML IJ SOLN
10.0000 [IU] | Freq: Once | INTRAMUSCULAR | Status: AC
  Administered 2021-03-11: 10 [IU] via SUBCUTANEOUS

## 2021-03-11 MED ORDER — SODIUM CHLORIDE 0.9 % IV BOLUS
1000.0000 mL | Freq: Once | INTRAVENOUS | Status: AC
  Administered 2021-03-11: 1000 mL via INTRAVENOUS

## 2021-03-11 NOTE — H&P (Addendum)
NEUROLOGY CONSULTATION NOTE   Date of service: March 11, 2021 Patient Name: [name] MRN:  [id number] DOB:  31-Aug-1960 Reason for consult: "Aphasia" Requesting Provider: Blanchie Dessert, MD _ _ _   _ __   _ __ _ _  __ __   _ __   __ _  History of Present Illness  [name] is a 61 y.o. 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 He wrote a note to his family and was brought in to the ED.  A stroke code was activated in the ED with a LKW of 1800 on 03/11/2021.  Speech is gibberish" silent any unfortunately unable to provide any meaningful history to me.  Most of the history is obtained from his friend [name] who is present at the bedside.  CT head without contrast with no large territory infarct no ICH.  CT angio head and neck initially with poor contrast bolus and difficult rule out LVO.  Repeat CT angio head and neck with no LVO.  CT perfusion with no mismatch.  Of note, glucose was elevated to over 500s at presentation.  Speech was noted to wax and wane with some periods of improved aphasia followed by worsening.  LKW: 1800 on 03/11/21. mRS: 0 tNKASE: not offered 2/2 recent right SCAstroke in the last 3 months with petechial hemorrhage. Thrombectomy: not offered 2/2 no LVO NIHSS components Score: Comment  1a Level of Conscious 0[x]  1[]  2[]  3[]      1b LOC Questions 0[]  1[]  2[x]       1c LOC Commands 0[]  1[x]  2[]       2 Best Gaze 0[x]  1[]  2[]       3 Visual 0[x]  1[]  2[]  3[]      4 Facial Palsy 0[x]  1[]  2[]  3[]      5a Motor Arm - left 0[x]  1[]  2[]  3[]  4[]  UN[]    5b Motor Arm - Right 0[x]  1[]  2[]  3[]  4[]  UN[]    6a Motor Leg - Left 0[x]  1[]  2[]  3[]  4[]  UN[]    6b Motor Leg - Right 0[x]  1[]  2[]  3[]  4[]  UN[]    7 Limb Ataxia 0[x]  1[]  2[]  3[]  UN[]     8 Sensory 0[x]  1[]  2[]  UN[]      9 Best Language 0[]  1[]  2[x]  3[]      10  Dysarthria 0[]  1[x]  2[]  UN[]      11 Extinct. and Inattention 0[x]  1[]  2[]       TOTAL: 6     ROS  Unable to obtain due to review of system due to aphasia.  Past History   Past Medical History:  Diagnosis Date   CAD (coronary artery disease)    a. 03/2015 NSTEMI: LHC with severe 3V CAD  (70% mid RCA, 95% OM1, 90% distal LCx, 90% OM3, 80% prox LAD and 90% ost D1) s/p DES to mLAD w/ small dissction Rx with DES, staged ost Ramus PCI/DES and dLCx s/p PCI/DES    Diabetes mellitus type 2 in obese ([place])    Diverticulosis    Hypercholesteremia    Hypertension    Obesity    Tobacco abuse    Past Surgical History:  Procedure Laterality Date   CARDIAC CATHETERIZATION N/A 03/19/2015   Procedure: Left Heart Cath and Coronary Angiography;  Surgeon: Lorretta Harp, MD;  Location: [place] [facility];  Service: Cardiovascular;  Laterality: N/A;   CARDIAC CATHETERIZATION  N/A 03/20/2015   Procedure: Coronary Stent Intervention;  Surgeon: Lorretta Harp, MD;  Location: [place] [facility];  Service: Cardiovascular;  Laterality: N/A;   CARDIAC CATHETERIZATION N/A 03/22/2015   Procedure: Coronary Stent Intervention;  Surgeon: Lorretta Harp, MD;  Location: [place] [facility];  Service: Cardiovascular;  Laterality: N/A;   LEFT HEART CATH AND CORONARY ANGIOGRAPHY N/A 12/25/2020   Procedure: LEFT HEART CATH AND CORONARY ANGIOGRAPHY;  Surgeon: Troy Sine, MD;  Location: [place] [facility];  Service: Cardiovascular;  Laterality: N/A;   LEFT HEART CATH AND CORONARY ANGIOGRAPHY N/A 12/26/2020   Procedure: LEFT HEART CATH AND CORONARY ANGIOGRAPHY;  Surgeon: Troy Sine, MD;  Location: [place] [facility];  Service: Cardiovascular;  Laterality: N/A;   No family history on file. Social History   Socioeconomic History   Marital status: Married    Spouse name: Not on file   Number of children: Not on file   Years of education: Not on file   Highest education level: Not on file  Occupational History    Not on file  Tobacco Use   Smoking status: Every Day    Packs/day: 0.50    Types: Cigarettes   Smokeless tobacco: Never  Vaping Use   Vaping Use: Never used  Substance and Sexual Activity   Alcohol use: Yes    Comment: weekend   Drug use: Yes    Types: Marijuana   Sexual activity: Not on file  Other Topics Concern   Not on file  Social History Narrative   Not on file   Social Determinants of Health   Financial Resource Strain: Not on file  Food Insecurity: Not on file  Transportation Needs: Not on file  Physical Activity: Not on file  Stress: Not on file  Social Connections: Not on file   Allergies  Allergen Reactions   Aspirin Other (See Comments)    Upset stomach      Medications  (Not in a hospital admission)    Vitals   Vitals:   03/11/21 2000  BP: (!) 160/101  Pulse: (!) 102  Resp: 16  Temp: 98.8 F (37.1 C)  TempSrc: Oral  SpO2: 98%     There is no height or weight on file to calculate BMI.  Physical Exam   General: Laying comfortably in bed; in no acute distress.  HENT: Normal oropharynx and mucosa. Normal external appearance of ears and nose.  Neck: Supple, no pain or tenderness  CV: No JVD. No peripheral edema.  Pulmonary: Symmetric Chest rise. Normal respiratory effort.  Abdomen: Soft to touch, non-tender.  Ext: No cyanosis, edema, or deformity  Skin: No rash. Normal palpation of skin.   Musculoskeletal: Normal digits and nails by inspection. No clubbing.   Neurologic Examination  Mental status/Cognition: Alert, oriented to self.  Unable to assess further orientation due to aphasia. Speech/language: Fluent, comprehension intact to some but not all commands, able to name some but not all objects. Makes errors when repeating. Cranial nerves:   CN II Pupils equal and reactive to light, no VF deficits    CN III,IV,VI EOM intact, no gaze preference or deviation, no nystagmus    CN V normal sensation in V1, V2, and V3 segments bilaterally     CN VII no asymmetry, no nasolabial fold flattening    CN VIII normal hearing to speech    CN IX & X normal palatal elevation, no uvular deviation    CN XI 5/5 head  turn and 5/5 shoulder shrug bilaterally    CN XII midline tongue protrusion    Motor:  Muscle bulk: normal, tone normal, pronator drift none tremor none Mvmt Root Nerve  Muscle Right Left Comments  SA C5/6 Ax Deltoid 5 5   EF C5/6 Mc Biceps 5 5   EE C6/7/8 Rad Triceps 5 5   WF C6/7 Med FCR     WE C7/8 PIN ECU     F Ab C8/T1 U ADM/FDI 5 5   HF L1/2/3 Fem Illopsoas 5 5   KE L2/3/4 Fem Quad 5 5   DF L4/5 D Peron Tib Ant 5 5   PF S1/2 Tibial Grc/Sol 5 5    Reflexes:  Right Left Comments  Pectoralis      Biceps (C5/6) 1 1   Brachioradialis (C5/6) 1 1    Triceps (C6/7) 1 1    Patellar (L3/4) 1 1    Achilles (S1)      Hoffman      Plantar     Jaw jerk    Sensation:  Light touch Intact throughout   Pin prick    Temperature    Vibration   Proprioception    Coordination/Complex Motor:  - Finger to Nose intact BL - Heel to shin intact BL - Rapid alternating movement are normal - Gait: Stride length short. Arm swing poor. Base width narrow.  Labs   CBC:  Recent Labs  Lab 03/11/21 2016  HGB 16.7  HCT 44.3    Basic Metabolic Panel:  Lab Results  Component Value Date   NA 131 (L) 03/11/2021   K 3.1 (L) 03/11/2021   CO2 30 (H) 02/13/2021   GLUCOSE 578 (HH) 03/11/2021   BUN 11 03/11/2021   CREATININE 1.30 (H) 03/11/2021   CALCIUM 10.4 (H) 02/13/2021   GFRNONAA 55 (L) 01/01/2021   GFRAA >60 08/25/2019   Lipid Panel:  Lab Results  Component Value Date   LDLCALC 165 (H) 12/25/2020   HgbA1c:  Lab Results  Component Value Date   HGBA1C >15 02/19/2021   Urine Drug Screen:     Component Value Date/Time   LABOPIA NONE DETECTED 12/24/2020 1227   COCAINSCRNUR NONE DETECTED 12/24/2020 1227   LABBENZ NONE DETECTED 12/24/2020 1227   AMPHETMU NONE DETECTED 12/24/2020 1227   THCU NONE DETECTED  12/24/2020 1227   LABBARB NONE DETECTED 12/24/2020 1227    Alcohol Level No results found for: ETH  CT Head without contrast(Personally reviewed): CTH was negative for a large hypodensity concerning for a large territory infarct or hyperdensity concerning for an ICH. ? L MCA M2 hyperdensity in the silvian fissure.  CT angio Head and Neck with contrast(Personally reviewed): No LVO  CT Perfusion: No mismatch.  rEEG:  pending  Impression   Donovon Micheletti is a 61 y.o. male presenting with acute onset expressive worse than sensory aphasia. He was not a candidate for tNKASE 2/2 R SCA stroke in the last 3 months with petechial hemorrhage, not a candidate for thrombectomy 2/2 no LVO, no mismatch on CTP.  Suspect that his presentation is either due to a small stroke or a potential foal seizure in the setting of significant hyperglycemia, no obvious seizure activity noted on exam.  Aphasia is waxing and waning with periods of improved speech, followed by significant word finding difficulty again.  Recommendations  - management of hyperglycemia per ED team. - Will load with Keppra 2000mg  Iv once - Recommend MRI Brain without contrast and if notable  for stroke, recommend full stroke workup. I would recommend Permissive hypertension to 220/110 unless MRI Brain is negative for an acute stroke. - Recommend routine EEG. - Neurology will continue to follow along. ___________________________________________________________________  Plan discussed with Dr. Maryan Rued with the ED team. I also updated patient, his friend, his uncle and his estranged wife over the phone.  This patient is critically ill and at significant risk of neurological worsening, death and care requires constant monitoring of vital signs, hemodynamics,respiratory and cardiac monitoring, neurological assessment, discussion with family, other specialists and medical decision making of high complexity. I spent 45 minutes of neurocritical  care time  in the care of  this patient. This was time spent independent of any time provided by nurse practitioner or PA.  Donnetta Simpers [facility] Pager Number [phone] 03/11/2021  9:52 PM  Update: Reviewed MRI Brain without contrast which is negative for an acute stroke. Will start him on cEEG instead of routine EEG.  Thank you for the opportunity to take part in the care of this patient. If you have any further questions, please contact the neurology consultation attending.  Signed,  [place] Pager Number [phone] _ _ _   _ __   _ __ _ _  __ __   _ __   __ _

## 2021-03-11 NOTE — ED Notes (Signed)
Critical lab value BGL 545 communicated to provider

## 2021-03-11 NOTE — ED Provider Notes (Signed)
[facility] Emergency Department Provider Note MRN:  [id number]  Arrival date & time: 03/12/21     Chief Complaint   Code Stroke   History of Present Illness   [name] is a 61 y.o. year-old male with a history of CAD, HTN, HLD, Obestiy, tobacco use presenting to the ED who presents with sudden onset speech difficulty with a LKW of 1800.    Family reports that speech is gibberish and the patient is unable to provide meaningful history.  Patient's friend is at bedside who provides portions of the history.  States that last known normal was at 6 PM.   Review of Systems  Unable to obtain history secondary to aphasia  Patient's Health History    Past Medical History:  Diagnosis Date   CAD (coronary artery disease)    a. 03/2015 NSTEMI: LHC with severe 3V CAD  (70% mid RCA, 95% OM1, 90% distal LCx, 90% OM3, 80% prox LAD and 90% ost D1) s/p DES to mLAD w/ small dissction Rx with DES, staged ost Ramus PCI/DES and dLCx s/p PCI/DES    Diabetes mellitus type 2 in obese ([place])    Diverticulosis    Hypercholesteremia    Hypertension    Obesity    Tobacco abuse     Past Surgical History:  Procedure Laterality Date   CARDIAC CATHETERIZATION N/A 03/19/2015   Procedure: Left Heart Cath and Coronary Angiography;  Surgeon: Lorretta Harp, MD;  Location: [place] [facility];  Service: Cardiovascular;  Laterality: N/A;   CARDIAC CATHETERIZATION N/A 03/20/2015   Procedure: Coronary Stent Intervention;  Surgeon: Lorretta Harp, MD;  Location: [place] [facility];  Service: Cardiovascular;  Laterality: N/A;   CARDIAC CATHETERIZATION N/A 03/22/2015   Procedure: Coronary Stent Intervention;  Surgeon: Lorretta Harp, MD;  Location: [place] [facility];  Service: Cardiovascular;  Laterality: N/A;   LEFT HEART CATH AND CORONARY ANGIOGRAPHY N/A 12/25/2020   Procedure: LEFT HEART CATH AND CORONARY ANGIOGRAPHY;  Surgeon: Troy Sine, MD;  Location: [place] [facility];  Service:  Cardiovascular;  Laterality: N/A;   LEFT HEART CATH AND CORONARY ANGIOGRAPHY N/A 12/26/2020   Procedure: LEFT HEART CATH AND CORONARY ANGIOGRAPHY;  Surgeon: Troy Sine, MD;  Location: [place] [facility];  Service: Cardiovascular;  Laterality: N/A;    No family history on file.  Social History   Socioeconomic History   Marital status: Married    Spouse name: Not on file   Number of children: Not on file   Years of education: Not on file   Highest education level: Not on file  Occupational History   Not on file  Tobacco Use   Smoking status: Every Day    Packs/day: 0.50    Types: Cigarettes   Smokeless tobacco: Never  Vaping Use   Vaping Use: Never used  Substance and Sexual Activity   Alcohol use: Yes    Comment: weekend   Drug use: Yes    Types: Marijuana   Sexual activity: Not on file  Other Topics Concern   Not on file  Social History Narrative   Not on file   Social Determinants of Health   Financial Resource Strain: Not on file  Food Insecurity: Not on file  Transportation Needs: Not on file  Physical Activity: Not on file  Stress: Not on file  Social Connections: Not on file  Intimate Partner Violence: Not on file     Physical Exam   Physical  Exam Vitals and nursing note reviewed.  Constitutional:      Appearance: Normal appearance. He is obese. He is not ill-appearing.  Cardiovascular:     Rate and Rhythm: Normal rate and regular rhythm.  Pulmonary:     Effort: Pulmonary effort is normal.     Breath sounds: Normal breath sounds.  Abdominal:     General: Abdomen is flat.     Palpations: Abdomen is soft.  Musculoskeletal:     Cervical back: Neck supple. No rigidity or tenderness.  Neurological:     Mental Status: He is alert.     Cranial Nerves: Cranial nerves 2-12 are intact.     Sensory: Sensation is intact.     Motor: Motor function is intact.     Coordination: Coordination is intact.  Psychiatric:        Attention and Perception:  Attention normal.        Mood and Affect: Mood is anxious.     Comments: Speech is nonsensical.  Patient unable to follow basic commands.      Diagnostic and Interventional Summary    Labs Reviewed  APTT - Abnormal; Notable for the following components:      Result Value   aPTT 22 (*)    All other components within normal limits  COMPREHENSIVE METABOLIC PANEL - Abnormal; Notable for the following components:   Sodium 129 (*)    Potassium 3.1 (*)    Chloride 89 (*)    Glucose, Bld 545 (*)    Creatinine, Ser 1.46 (*)    Alkaline Phosphatase 177 (*)    GFR, Estimated 55 (*)    All other components within normal limits  I-STAT CHEM 8, ED - Abnormal; Notable for the following components:   Sodium 131 (*)    Potassium 3.1 (*)    Chloride 91 (*)    Creatinine, Ser 1.30 (*)    Glucose, Bld 578 (*)    Calcium, Ion 1.06 (*)    All other components within normal limits  CBG MONITORING, ED - Abnormal; Notable for the following components:   Glucose-Capillary 546 (*)    All other components within normal limits  CBG MONITORING, ED - Abnormal; Notable for the following components:   Glucose-Capillary 425 (*)    All other components within normal limits  RESP PANEL BY RT-PCR (FLU A&B, COVID) ARPGX2  PROTIME-INR  CBC  DIFFERENTIAL  TSH  CBC  BASIC METABOLIC PANEL  LIPID PANEL    CT ANGIO HEAD NECK W WO CM W PERF (CODE STROKE)  Final Result    CT HEAD CODE STROKE WO CONTRAST  Final Result  Addendum (preliminary) 1 of 1  ADDENDUM REPORT: 03/11/2021 20:36    ADDENDUM:  Hyperdense focus in the left sylvian fissure (series 3, image 13),  concerning for thrombus in the left M2.    Initial code stroke imaging results were communicated on 03/11/2021  at 8:15 pm to provider Dr. Lorrin Goodell via secure text paging.  Addendum was discussed with him at 8:34 p.m. on 03/11/2021 by  telephone.      Electronically Signed    By: Merilyn Baba M.D.    On: 03/11/2021 20:36      Final     MR BRAIN WO CONTRAST    (Results Pending)    Medications  enoxaparin (LOVENOX) injection 40 mg (has no administration in time range)  sodium chloride flush (NS) 0.9 % injection 3 mL (3 mLs Intravenous Given 03/11/21 2138)  iohexol (OMNIPAQUE)  350 MG/ML injection 160 mL (160 mLs Intravenous Contrast Given 03/11/21 2117)  insulin aspart (novoLOG) injection 10 Units (10 Units Subcutaneous Given 03/11/21 2149)  sodium chloride 0.9 % bolus 1,000 mL (0 mLs Intravenous Stopped 03/11/21 2243)  levETIRAcetam (KEPPRA) 2,000 mg in sodium chloride 0.9 % 250 mL IVPB (0 mg Intravenous Stopped 03/11/21 2335)     Procedures  /  Critical Care Procedures  ED Course and Medical Decision Making  Initial Impression and Ddx 61 year old male presents to emergency department for evaluation of aphasia.  Differential diagnosis includes ischemic stroke, hemorrhagic stroke, heart rate abnormality.  Will obtain labs and imaging to further evaluate.  CT head without contrast was obtained and revealed no large territory infarct.  CT angio head and neck was obtained without perfusion mismatch.  Point-of-care glucose was obtained and did reveal the patient to have a glucose over 500.  Past medical/surgical history that increases complexity of ED encounter: Vascular risk factors as per above  Interpretation of Diagnostics I personally reviewed the EKG, Chest Xray, and Cardiac Monitor and my interpretation is as follows: EKG reviewed and was nonischemic.  Cardiac monitor normal sinus rhythm.    CT imaging reviewed as per above.  Patient was noted to be hyperglycemic.  Patient has not been diabetic ketoacidosis this time.  Will administer 1 L of fluids and 10 units of fast acting insulin.  Possible differentials also include seizure for which neurology wants the patient to be admitted.  We will also order MRI without to evaluate for small vascular infarct.  Patient Reassessment and Ultimate Disposition/Management Will  admit the patient to the family medicine team for further management of the patient's acute aphasia.  They agreed to admit the patient to their service for further management.  Neurology will consult.  Patient management required discussion with the following services or consulting groups:  Hospitalist Service and Neurology  Complexity of Problems Addressed Acute illness or injury that poses threat of life of bodily function  Additional Data Reviewed and Analyzed Further history obtained from: EMS on arrival  Factors Impacting ED Encounter Risk Consideration of hospitalization    Final Clinical Impressions(s) / ED Diagnoses     ICD-10-CM   1. Aphasia  R47.01       ED Discharge Orders     None        Discharge Instructions Discussed with and Provided to Patient:   Discharge Instructions   None       Zachery Dakins, MD 03/12/21 3291    Blanchie Dessert, MD 03/13/21 1530

## 2021-03-11 NOTE — ED Provider Triage Note (Signed)
Emergency Medicine Provider Triage Evaluation Note  [name] , a 61 y.o. male  was evaluated in triage.  Pt complains of acute onset of slurred speech.  Patient states at about 6 PM, he had significant difficulty trying to form words and speak.  He wrote a note to his uncle, who passed this along to a friend to take him to the emergency department.  He is scheduled to have open heart surgery on Friday, had a catheterization several months ago. Last known normal 6pm.  Review of Systems  Positive: Slurred speech Negative: Weakness  Physical Exam  BP (!) 160/101    Pulse (!) 102    Temp 98.8 F (37.1 C) (Oral)    Resp 16    SpO2 98%  Gen:   Awake, no distress   Resp:  Normal effort  MSK:   Moves extremities without difficulty  Other:  Patient speech significantly garbled, friend notes that it is worse compared to normal.  Patient has difficulty following basic commands.  Questionable left facial droop.  Normal strength in all extremities.  Medical Decision Making  Medically screening exam initiated at 8:08 PM.  Appropriate orders placed.  [name] was informed that the remainder of the evaluation will be completed by another provider, this initial triage assessment does not replace that evaluation, and the importance of remaining in the ED until their evaluation is complete.  Activated code stroke, and obtained necessary labs and imaging   Kateri Plummer, PA-C 03/11/21 2010

## 2021-03-11 NOTE — ED Notes (Signed)
Patient transported to MRI 

## 2021-03-11 NOTE — ED Notes (Signed)
Admission provider left pt's room. This nurse and him spoke about a swallow screen. MD agrees that a bedside swallow screen should not be done due to pt's speech and getting choked up on saliva. An order for SLP will be ordered for further eval

## 2021-03-11 NOTE — Code Documentation (Signed)
Stroke Response Nurse Documentation Code Documentation  Sonia Stickels is a 61 y.o. male arriving to [facility] ED via Private Vehicle on 1/30 with past medical hx of CAD, HTN, DM, HLD, prior CVA. On aspirin 325 mg daily and clopidogrel 75 mg daily. Code stroke was activated by ED.   Patient from home where he was LKW at 1800 and now complaining of gibberish speech.   Stroke team at the bedside on patient arrival. Labs drawn and patient cleared for CT by Dr. Kalman Drape. Patient to CT with team. NIHSS 6, see documentation for details and code stroke times. Patient with disoriented, not following commands, Global aphasia , and dysarthria  on exam. The following imaging was completed:  CT, CTA head and neck, CTP. Patient is not a candidate for IV Thrombolytic due to recent CVA. Patient is not a candidate for IR due to No LVO.    Bedside handoff with ED RN Alyssa.    Madelynn Done  Rapid Response RN

## 2021-03-11 NOTE — ED Notes (Signed)
Mike(uncle) would like an update when patient is in a room. [phone]

## 2021-03-11 NOTE — ED Triage Notes (Addendum)
Patient POV. Expressive aphasia, no weakness, left sided facial dropp. LKW 1800.  Code stroke called in triage

## 2021-03-11 NOTE — ED Notes (Signed)
Pt brought to CT initially and 2009. Pt transported to CT again for CTA at 2023.

## 2021-03-11 NOTE — H&P (Addendum)
[facility] Admission History and Physical Service Pager: [phone]  Patient name: [name] Medical record number: [id number] Date of birth: [birth year] Age: 60 y.o. Gender: male  Primary Care Provider: [name], MD Consultants: Neurology Code Status: Full Preferred Emergency Contact: [name] [phone]  Chief Complaint: "I Can't Speak"  Assessment and Plan: [name] is a 61 y.o. male presenting with aphasia. PMH is significant for Hx of CVA 12/2020, HTN, T2DM, CAD s/p PCI, CKD, history of NSTEMI tobacco use, EtOH use history,  Aphasia   CVA v. Seizure Patient being admitted for possible stroke and acute aphasia.  Code stroke was called in the ED and CT head was obtained which showed no acute intracranial process, aspects of 10, encephalomalacia in the superior right cerebellum likely worsening of prior infarct, and hyperdense focus in the left sylvian fissure concerning for thrombus in the left M2.  CTA head/neck showed no large vessel occlusions, severe narrowing in the left V4, multifocal narrowing of internal carotid arteries, and unchanged moderate stenosis of left vertebral artery.  MRI is pending. EKG showed sinus rhythm with atrial enlargement. Labs in the ED showed normal PT/INR, normal CBC, CMP with hypokalemia, hyperglycemia, elevated alkaline phosphatase.  Was given 1 L NS bolus.  Neurology was consulted by ED during code stroke and loaded patient with 2000 mg Keppra IV.  He was not a candidate for TNKase secondary to a right MCA stroke at last hospitalization 12/2020 and was not a candidate for thrombectomy secondary to no LVO or mismatch on CT. Vitals in the ED showed patient being afebrile, pulses in the 90s to low 100s, mildly tachypneic in the 20s, hypertensive (discussed below) and saturating high 90s on room air. On examination patient appeared overall without neurologic deficits except for aphasia and difficulty with tongue protrusion.  Was able to comprehend everything when speaking to him.  Had difficulty with word finding and difficulty with writing/texting. Differentials include stroke given patient had acute aphasia and difficulty with processing with CT imaging showing left M2 thrombus which could align with symptoms. Possibility of focal seizure as well given aphasia symptoms and hyperglycemia. Unlikely to be trauma given CT imaging of bleed. -Admit to FPTS, progressive, attending Dr. McDiarmid -Neurology following, appreciate recommendations -Follow up MRI -Follow-up EEG -Seizure precautions -Neurochecks every 4 hours -Diet NPO until SLP evaluation -consider mIVF if patient NPO for longer -Permissive hypertension to 220/110 per neurology -SLP to evaluate swallowing/aphasia -PT/OT to evaluate and treat -UDS -AM CBC, CMP, TSH, lipid panel,   Hyperglycemia   T2DM Patient had hyperglycemia to 545 on admission without elevated anion gap.  Last A1c recently on 02/19/2021 was greater than 15. Was given 10 units novolog in ED. Patient has home medication of 18 units Semglee twice daily. He attested that he was unable to take this medication today. -CBG checks every 4 hours -Add insulin back when patient is no longer n.p.o. -Monitor a.m. BMP  HTN Blood pressure ranges have been 160-201/94-110.  Has home medication of amlodipine 10 mg daily, Lasix 20 mg as needed for edema. -Hold home antihypertensives in the setting of permissive hypertension (up to 220/110 per neurology) -Monitor blood pressures  CAD s/p PCI   history of MI   HLD Follows with cardiology outpatient Dr. Johney Frame.  Had an NSTEMI and cath with severe multivessel CAD with normal LV function at last hospitalization 12/2021.  Patient had planned surgery on 03/15/2021 for CABG grafting and and TEE but had to wait 6 weeks  for neuro to clear him after his CVA during his last hospitalization.  Has home medications of imdur 90 mg daily, nitroglycerin as needed for  chest pain, atorvastatin 80 mg daily, aspirin 325  mg daily, and plavix. -Lipid panel -Restart home medications when patient no longer n.p.o.  HFpEF Last echo 12/24/2020 showed EF of 55 to 60% and grade 1 diastolic dysfunction. Does not appear edematous on examination today. Has home medication of lasix 20 mg prn for leg swelling. -Hold home lasix  -Monitor fluid status -Strict I's and O's -Daily weights  Hypokalemia Potassium was 3.1 on admission. -Repleted 4 runs of IV K  -Monitor on CMP  CKD 3a Creatinine today was 1.46. Baseline creatinine has been around 1.4-1.78. -Monitor on CMP  Enlarged thyroid lobe/nodules On CT head scans today showed enlargement of right thyroid lobe with multiple hypoattenuating nodules. Has been seen on past scans. -Consider thyroid u/s   Tobacco use Patient has history of tobacco use currently vapes per notes. -Encourage cessation  EtOH use Patient does not drink as much since his last admission in November 2022.  Michela Pitcher his last drink was on Friday.  Denies daily use anymore. -Monitor  FEN/GI: N.p.o. until SLP evaluates Prophylaxis: Lovenox  Disposition: Progressive  History of Present Illness:  [name] is a 61 y.o. male presenting with aphasia.  Patient says that he started having symptoms at 12 PM today where he just "could not speak".  Said that yesterday he had began to have some difficulties with reading but thought that it would improve.  Found that it was very hard to write or text today as he just could not put the words together.  He denies any weakness, headaches, vision changes. Says that 3 friends were with him when the event occurred this morning.  Says that he was planning on going to his surgery on 3 February and had a stroke last November at his last admission.  He denies any chest pain, shortness of breath, abdominal pain, diarrhea/constipation, leg swelling. He says that he lives with his uncle and we can discuss with him over  the phone. He feels his symptoms are improving and said "2 hours ago I could not get a word out." Says he is able to speak if he speaks slowly.  Was unable to reach uncle for further history  Review Of Systems: Per HPI with the following additions:   Review of Systems  Eyes:  Positive for redness. Negative for visual disturbance.  Respiratory:  Negative for shortness of breath.   Cardiovascular:  Negative for chest pain and leg swelling.  Gastrointestinal:  Negative for abdominal pain, constipation and diarrhea.  Genitourinary:  Negative for dysuria.  Musculoskeletal:  Negative for myalgias.  Skin:  Negative for rash.  Neurological:  Positive for speech difficulty. Negative for headaches.    Patient Active Problem List   Diagnosis Date Noted   Cerebral embolism with cerebral infarction 12/28/2020   AKI (acute kidney injury) ([place])    Chest pain 12/24/2020   History of MI (myocardial infarction) 03/19/2018   CAD (coronary artery disease)    Diabetes mellitus type 2 in obese [facility])    Hypercholesteremia    Tobacco abuse    Obesity    NSTEMI (non-ST elevated myocardial infarction) ([place]) 03/17/2015   Essential hypertension 03/16/2015   Hyperlipidemia 03/16/2015   Dyspnea on exertion 03/16/2015   Diverticulosis     Past Medical History: Past Medical History:  Diagnosis Date   CAD (coronary artery  disease)    a. 03/2015 NSTEMI: LHC with severe 3V CAD  (70% mid RCA, 95% OM1, 90% distal LCx, 90% OM3, 80% prox LAD and 90% ost D1) s/p DES to mLAD w/ small dissction Rx with DES, staged ost Ramus PCI/DES and dLCx s/p PCI/DES    Diabetes mellitus type 2 in obese ([place])    Diverticulosis    Hypercholesteremia    Hypertension    Obesity    Tobacco abuse     Past Surgical History: Past Surgical History:  Procedure Laterality Date   CARDIAC CATHETERIZATION N/A 03/19/2015   Procedure: Left Heart Cath and Coronary Angiography;  Surgeon: Lorretta Harp, MD;  Location: [place] [facility];   Service: Cardiovascular;  Laterality: N/A;   CARDIAC CATHETERIZATION N/A 03/20/2015   Procedure: Coronary Stent Intervention;  Surgeon: Lorretta Harp, MD;  Location: [place] [facility];  Service: Cardiovascular;  Laterality: N/A;   CARDIAC CATHETERIZATION N/A 03/22/2015   Procedure: Coronary Stent Intervention;  Surgeon: Lorretta Harp, MD;  Location: [place] [facility];  Service: Cardiovascular;  Laterality: N/A;   LEFT HEART CATH AND CORONARY ANGIOGRAPHY N/A 12/25/2020   Procedure: LEFT HEART CATH AND CORONARY ANGIOGRAPHY;  Surgeon: Troy Sine, MD;  Location: [place] [facility];  Service: Cardiovascular;  Laterality: N/A;   LEFT HEART CATH AND CORONARY ANGIOGRAPHY N/A 12/26/2020   Procedure: LEFT HEART CATH AND CORONARY ANGIOGRAPHY;  Surgeon: Troy Sine, MD;  Location: [place] [facility];  Service: Cardiovascular;  Laterality: N/A;    Social History: Social History   Tobacco Use   Smoking status: Every Day    Packs/day: 0.50    Types: Cigarettes   Smokeless tobacco: Never  Vaping Use   Vaping Use: Never used  Substance Use Topics   Alcohol use: Yes    Comment: weekend   Drug use: Yes    Types: Marijuana   Additional social history: Denies any drug use, vapes, last alcoholic drink on Friday denies daily use. Lives with uncle.  Please also refer to relevant sections of EMR.  Family History: No family history on file.  Allergies and Medications: Allergies  Allergen Reactions   Aspirin Other (See Comments)    Upset stomach     No current facility-administered medications on file prior to encounter.   Current Outpatient Medications on File Prior to Encounter  Medication Sig Dispense Refill   amLODipine (NORVASC) 10 MG tablet Take 1 tablet (10 mg total) by mouth daily. 30 tablet 0   aspirin EC 325 MG EC tablet Take 1 tablet (325 mg total) by mouth daily. 30 tablet 0   atorvastatin (LIPITOR) 80 MG tablet Take 1 tablet (80 mg total) by mouth daily. 30 tablet 0    blood glucose meter kit and supplies Dispense based on patient and insurance preference. Use up to four times daily as directed. (FOR ICD-10 E10.9, E11.9). 1 each 0   carvedilol (COREG) 6.25 MG tablet Take 3 tablets (18.75 mg total) by mouth 2 (two) times daily with a meal. 60 tablet 0   clopidogrel (PLAVIX) 75 MG tablet Take 1 tablet (75 mg total) by mouth daily. 30 tablet 2   furosemide (LASIX) 20 MG tablet Take 1 tablet (20 mg total) by mouth daily as needed for edema. 30 tablet 0   insulin glargine-yfgn (SEMGLEE, YFGN,) 100 UNIT/ML Pen Inject 18 Units into the skin 2 (two) times daily. 15 mL 0   isosorbide mononitrate (IMDUR) 60 MG 24 hr tablet Take  1.5 tablets (90 mg total) by mouth daily. 135 tablet 3   nitroGLYCERIN (NITROSTAT) 0.4 MG SL tablet Place 1 tablet (0.4 mg total) under the tongue every 5 (five) minutes as needed for chest pain (CP or SOB). 25 tablet 12    Objective: BP (!) 188/100    Pulse 89    Temp 98.8 F (37.1 C) (Oral)    Resp (!) 27    SpO2 97%  Exam: General: Somewhat anxious, Nontoxic, laying in bed, alert and responsive to questions Eyes: conjunctival injection, no drainage ENTM: mucous membranes slightly dry, normocephalic atraumatic Neck: ROM intact Cardiovascular: RRR no m/r/g Respiratory: CTAB no w/r/c, no iWOB, speaking full sentences Gastrointestinal: Mildly distended, firm, non tender to palpation, bowel sounds normoactive MSK: No LE edema Derm: No rashes/lesions Neuro: CN II: PERRL CN III, IV,VI: EOMI CV V: Normal sensation in V1, V2, V3 CVII: Symmetric smile and brow raise CN VIII: Normal hearing CN IX,X: Symmetric palate raise  CN XI: 5/5 shoulder shrug CN XII: Symmetric tongue protrusion, some difficulty initiating this action UE and LE strength 5/5 Normal sensation in UE and LE bilaterally  Psych: mood appropriate, anxious  Labs and Imaging: CBC BMET  Recent Labs  Lab 03/11/21 2007 03/11/21 2016  WBC 7.7  --   HGB 16.0 16.7  HCT 45.0  49.0  PLT 225  --    Recent Labs  Lab 03/11/21 2007 03/11/21 2016  NA 129* 131*  K 3.1* 3.1*  CL 89* 91*  CO2 27  --   BUN 10 11  CREATININE 1.46* 1.30*  GLUCOSE 545* 578*  CALCIUM 9.3  --      EKG: sinus rhythm with atrial enlargement.  CT HEAD CODE STROKE WO CONTRAST  Addendum Date: 03/11/2021   ADDENDUM REPORT: 03/11/2021 20:36 ADDENDUM: Hyperdense focus in the left sylvian fissure (series 3, image 13), concerning for thrombus in the left M2. Initial code stroke imaging results were communicated on 03/11/2021 at 8:15 pm to provider Dr. Lorrin Goodell via secure text paging. Addendum was discussed with him at 8:34 p.m. on 03/11/2021 by telephone. Electronically Signed   By: Merilyn Baba M.D.   On: 03/11/2021 20:36   Result Date: 03/11/2021 CLINICAL DATA:  Code stroke.  Slurred speech, hypertension EXAM: CT HEAD WITHOUT CONTRAST TECHNIQUE: Contiguous axial images were obtained from the base of the skull through the vertex without intravenous contrast. RADIATION DOSE REDUCTION: This exam was performed according to the departmental dose-optimization program which includes automated exposure control, adjustment of the mA and/or kV according to patient size and/or use of iterative reconstruction technique. COMPARISON:  12/27/2020 CT, correlation is also made with 12/28/2020 MRI FINDINGS: Brain: No evidence of acute infarction, hemorrhage, cerebral edema, mass, mass effect, or midline shift. Hypodensity in the right superior cerebellum, consistent with evolution of the previously noted right cerebellar infarct on 12/28/2020. Ventricles and sulci are normal for age. No extra-axial fluid collection. Vascular: No hyperdense vessel or unexpected calcification. Skull: Normal. Negative for fracture or focal lesion. Mucosal thickening in the posterior left ethmoid air cells. Otherwise negative.: No acute finding. Other: The mastoid air cells are well aerated. ASPECTS [facility] Stroke Program Early CT Score) -  Ganglionic level infarction (caudate, lentiform nuclei, internal capsule, insula, M1-M3 cortex): 7 - Supraganglionic infarction (M4-M6 cortex): 3 Total score (0-10 with 10 being normal): 10 IMPRESSION: 1. No acute intracranial process. 2. ASPECTS is 10 3. Encephalomalacia in the superior right cerebellum, consistent with expected evolution of previously noted right cerebellar infarct. Electronically  Signed: By: Merilyn Baba M.D. On: 03/11/2021 20:23   CT ANGIO HEAD NECK W WO CM W PERF (CODE STROKE)  Result Date: 03/11/2021 CLINICAL DATA:  Slurred speech, hypertension EXAM: CT ANGIOGRAPHY HEAD AND NECK CT PERFUSION BRAIN TECHNIQUE: Multidetector CT imaging of the head and neck was performed using the standard protocol during bolus administration of intravenous contrast. Multiplanar CT image reconstructions and MIPs were obtained to evaluate the vascular anatomy. Carotid stenosis measurements (when applicable) are obtained utilizing NASCET criteria, using the distal internal carotid diameter as the denominator. Multiphase CT imaging of the brain was performed following IV bolus contrast injection. Subsequent parametric perfusion maps were calculated using RAPID software. RADIATION DOSE REDUCTION: This exam was performed according to the departmental dose-optimization program which includes automated exposure control, adjustment of the mA and/or kV according to patient size and/or use of iterative reconstruction technique. CONTRAST:  60 mL Omnipaque 350 COMPARISON:  None. 12/28/2020 FINDINGS: CT HEAD FINDINGS For noncontrast findings, please see same day CT head. CTA NECK FINDINGS Aortic arch: Three-vessel arch with a common origin of the brachiocephalic and left common carotid arteries, as well as an aortic origin of the left vertebral artery. No evidence of aneurysm or dissection. Moderate stenosis of the left vertebral artery origin, secondary to calcified and noncalcified plaque, similar to the prior exam.  Right carotid system: No evidence of dissection, stenosis (50% or greater) or occlusion. Calcified and noncalcified plaque at the bifurcation, with less than 50% stenosis. Left carotid system: No evidence of dissection, stenosis (50% or greater) or occlusion. Calcified and noncalcified plaque at the bifurcation, with less than 50% stenosis. Vertebral arteries: Moderate stenosis at the aortic origin of the left vertebral artery, which is irregular but otherwise patent through the V3 segment. Mild narrowing at the origin of the right vertebral artery, which is otherwise patent through the V3 segment. Skeleton: No acute osseous abnormality. Other neck: Redemonstrated enlargement of the right thyroid lobe, with multiple hypoattenuating nodules. Upper chest: Negative. Review of the MIP images confirms the above findings CTA HEAD FINDINGS Anterior circulation: Redemonstrated multifocal calcified and noncalcified narrowing of the right intracranial ICA, which remains severe in the cavernous segment and supraclinoid/ophthalmic segments, unchanged. Severe narrowing in the distal left cavernous and supraclinoid ICA is again seen. Patent left A1. Diffuse moderate narrowing of the right A1. Normal anterior communicating artery. Redemonstrated irregularity of the distal ACAs, which remain patent. Redemonstrated mild irregularity in the M1 segments. Normal MCA bifurcations. Distal MCA segments are irregular but perfused. Posterior circulation: Redemonstrated severe narrowing in the proximal left V4 and non opacification of the distal left V4, with reconstitution proximal to the vertebrobasilar junction, favored to be retrograde. Moderate multifocal narrowing in the right V4. Severe narrowing of the basilar artery, unchanged from the prior exam. Severe narrowing at the origin of the right superior cerebellar artery. The left superior cerebellar artery is patent. Patent right posterior communicating artery, with increased stenosis  in the right P1 segment, which is poorly visualized on the current exam. The right P2 and P3 segments are otherwise patent, although somewhat irregular. Patent left P1 segment. Patent left posterior communicating artery. The left P2 and P3 segments are patent, although somewhat irregular. Venous sinuses: As permitted by contrast timing, patent. Anatomic variants: None significant CT BRAIN PERFUSION FINDINGS: ASPECTS: 10 CBF (<30%) Volume: 14m Perfusion (Tmax>6.0s) volume: 0109mMismatch Volume: 59m73mnfarction Location:None IMPRESSION: 1. No acute intracranial process or acute large vessel occlusion. No core infarct on CT perfusion. 2. Redemonstrated atherosclerotic  narrowing of the intracranial vasculature, with severe narrowing in the left V4, focal occlusion, and distal reconstitution; moderate focal narrowing in the right V4; multifocal narrowing of the bilateral intracranial internal carotid arteries, which is severe at points; and moderate narrowing of the right A1; with otherwise irregular distal anterior and posterior circulation 3. Unchanged moderate stenosis at the origin of the left vertebral artery and mild narrowing at the origin of the right vertebral artery. No other hemodynamically significant stenosis in the neck. 4. Redemonstrated enlarged right thyroid lobe, with multiple hypoattenuating nodules. If this has not previously been evaluated, an ultrasound of the thyroid is recommended. Electronically Signed   By: Merilyn Baba M.D.   On: 03/11/2021 21:20     Gerrit Heck, MD 03/11/2021, 11:11 PM PGY-1, [facility] pager: [phone], text pages welcome

## 2021-03-12 ENCOUNTER — Observation Stay (HOSPITAL_COMMUNITY): Payer: BC Managed Care – PPO

## 2021-03-12 ENCOUNTER — Encounter (HOSPITAL_COMMUNITY): Payer: Self-pay | Admitting: Student

## 2021-03-12 DIAGNOSIS — R29818 Other symptoms and signs involving the nervous system: Secondary | ICD-10-CM

## 2021-03-12 DIAGNOSIS — E1169 Type 2 diabetes mellitus with other specified complication: Secondary | ICD-10-CM

## 2021-03-12 DIAGNOSIS — E785 Hyperlipidemia, unspecified: Secondary | ICD-10-CM

## 2021-03-12 DIAGNOSIS — E1159 Type 2 diabetes mellitus with other circulatory complications: Secondary | ICD-10-CM | POA: Diagnosis not present

## 2021-03-12 DIAGNOSIS — R4702 Dysphasia: Secondary | ICD-10-CM | POA: Diagnosis not present

## 2021-03-12 DIAGNOSIS — G9389 Other specified disorders of brain: Secondary | ICD-10-CM

## 2021-03-12 DIAGNOSIS — Z794 Long term (current) use of insulin: Secondary | ICD-10-CM | POA: Diagnosis not present

## 2021-03-12 DIAGNOSIS — E1149 Type 2 diabetes mellitus with other diabetic neurological complication: Secondary | ICD-10-CM | POA: Diagnosis not present

## 2021-03-12 DIAGNOSIS — E010 Iodine-deficiency related diffuse (endemic) goiter: Secondary | ICD-10-CM | POA: Diagnosis not present

## 2021-03-12 DIAGNOSIS — I639 Cerebral infarction, unspecified: Secondary | ICD-10-CM | POA: Diagnosis not present

## 2021-03-12 DIAGNOSIS — R4701 Aphasia: Secondary | ICD-10-CM | POA: Diagnosis not present

## 2021-03-12 DIAGNOSIS — R569 Unspecified convulsions: Secondary | ICD-10-CM | POA: Diagnosis not present

## 2021-03-12 DIAGNOSIS — I152 Hypertension secondary to endocrine disorders: Secondary | ICD-10-CM

## 2021-03-12 DIAGNOSIS — E041 Nontoxic single thyroid nodule: Secondary | ICD-10-CM | POA: Diagnosis not present

## 2021-03-12 LAB — CBC
HCT: 41.1 % (ref 39.0–52.0)
Hemoglobin: 14.5 g/dL (ref 13.0–17.0)
MCH: 29.8 pg (ref 26.0–34.0)
MCHC: 35.3 g/dL (ref 30.0–36.0)
MCV: 84.4 fL (ref 80.0–100.0)
Platelets: 205 10*3/uL (ref 150–400)
RBC: 4.87 MIL/uL (ref 4.22–5.81)
RDW: 12.2 % (ref 11.5–15.5)
WBC: 8.8 10*3/uL (ref 4.0–10.5)
nRBC: 0 % (ref 0.0–0.2)

## 2021-03-12 LAB — COMPREHENSIVE METABOLIC PANEL
ALT: 10 U/L (ref 0–44)
AST: 13 U/L — ABNORMAL LOW (ref 15–41)
Albumin: 3 g/dL — ABNORMAL LOW (ref 3.5–5.0)
Alkaline Phosphatase: 129 U/L — ABNORMAL HIGH (ref 38–126)
Anion gap: 12 (ref 5–15)
BUN: 8 mg/dL (ref 6–20)
CO2: 26 mmol/L (ref 22–32)
Calcium: 8.4 mg/dL — ABNORMAL LOW (ref 8.9–10.3)
Chloride: 97 mmol/L — ABNORMAL LOW (ref 98–111)
Creatinine, Ser: 1.29 mg/dL — ABNORMAL HIGH (ref 0.61–1.24)
GFR, Estimated: >60 mL/min (ref >60)
Glucose, Bld: 322 mg/dL — ABNORMAL HIGH (ref 70–99)
Potassium: 3.1 mmol/L — ABNORMAL LOW (ref 3.5–5.1)
Sodium: 135 mmol/L (ref 135–145)
Total Bilirubin: 0.9 mg/dL (ref 0.3–1.2)
Total Protein: 6.2 g/dL — ABNORMAL LOW (ref 6.5–8.1)

## 2021-03-12 LAB — GLUCOSE, CAPILLARY
Glucose-Capillary: 269 mg/dL — ABNORMAL HIGH (ref 70–99)
Glucose-Capillary: 329 mg/dL — ABNORMAL HIGH (ref 70–99)

## 2021-03-12 LAB — BASIC METABOLIC PANEL
Anion gap: 10 (ref 5–15)
BUN: 6 mg/dL (ref 6–20)
CO2: 27 mmol/L (ref 22–32)
Calcium: 8.5 mg/dL — ABNORMAL LOW (ref 8.9–10.3)
Chloride: 99 mmol/L (ref 98–111)
Creatinine, Ser: 1.32 mg/dL — ABNORMAL HIGH (ref 0.61–1.24)
GFR, Estimated: >60 mL/min (ref >60)
Glucose, Bld: 257 mg/dL — ABNORMAL HIGH (ref 70–99)
Potassium: 3 mmol/L — ABNORMAL LOW (ref 3.5–5.1)
Sodium: 136 mmol/L (ref 135–145)

## 2021-03-12 LAB — RAPID URINE DRUG SCREEN, HOSP PERFORMED
Amphetamines: NOT DETECTED
Barbiturates: NOT DETECTED
Benzodiazepines: NOT DETECTED
Cocaine: NOT DETECTED
Opiates: NOT DETECTED
Tetrahydrocannabinol: NOT DETECTED

## 2021-03-12 LAB — LDL CHOLESTEROL, DIRECT: Direct LDL: 114.3 mg/dL — ABNORMAL HIGH (ref 0–99)

## 2021-03-12 LAB — LIPID PANEL
Cholesterol: 250 mg/dL — ABNORMAL HIGH (ref 0–200)
HDL: 31 mg/dL — ABNORMAL LOW (ref >40)
LDL Cholesterol: UNDETERMINED mg/dL (ref 0–99)
Total CHOL/HDL Ratio: 8.1 RATIO
Triglycerides: 538 mg/dL — ABNORMAL HIGH (ref <150)
VLDL: UNDETERMINED mg/dL (ref 0–40)

## 2021-03-12 LAB — CBG MONITORING, ED
Glucose-Capillary: 342 mg/dL — ABNORMAL HIGH (ref 70–99)
Glucose-Capillary: 347 mg/dL — ABNORMAL HIGH (ref 70–99)
Glucose-Capillary: 310 mg/dL — ABNORMAL HIGH (ref 70–99)
Glucose-Capillary: 372 mg/dL — ABNORMAL HIGH (ref 70–99)

## 2021-03-12 LAB — TSH: TSH: 0.46 u[IU]/mL (ref 0.350–4.500)

## 2021-03-12 LAB — MAGNESIUM: Magnesium: 1.9 mg/dL (ref 1.7–2.4)

## 2021-03-12 IMAGING — MR MR HEAD W/O CM
19 of 21 series · 43 of 48 positions shown · non-contrast
Comparison: [DATE] MRI, correlation is also made with CT head
and CTA head [DATE].

CLINICAL DATA: Seizure, new onset recent SCA stroke in [DATE]

EXAM:
MRI HEAD WITHOUT CONTRAST
TECHNIQUE: Multiplanar, multiecho pulse sequences of the brain and surrounding
structures were obtained without intravenous contrast.

[Series 5: DWI · axial · 3.0mm · 0.92mm/px · z∈[-118,+47]mm · 3 of 112 slices shown (1 of 4)]
[im 1/112]
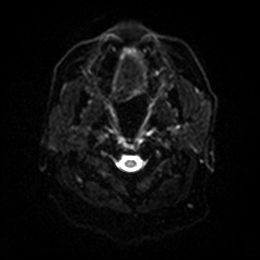
[im 56/112]
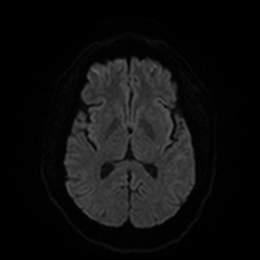
[im 112/112]
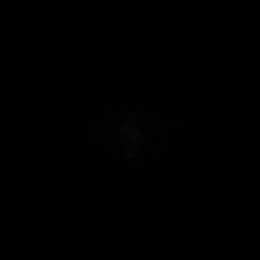

[Series 6: DWI · axial · 3.0mm · 0.92mm/px · z∈[-118,+47]mm · 2 of 56 slices shown (2 of 4)]
[im 1/56]
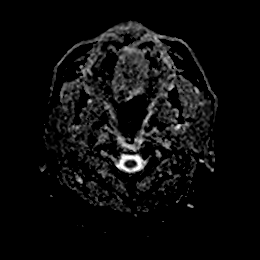
[im 56/56]
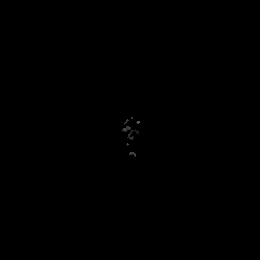

[Series 7: DWI · coronal · 4.0mm · 0.88mm/px · 3 of 84 slices shown (3 of 4)]
[im 1/84]
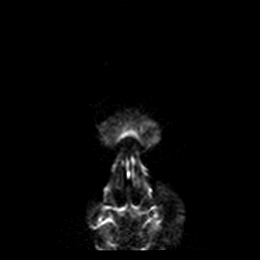
[im 42/84]
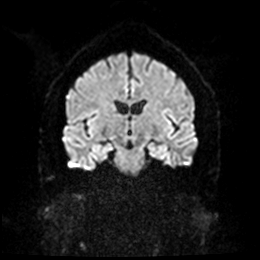
[im 84/84]
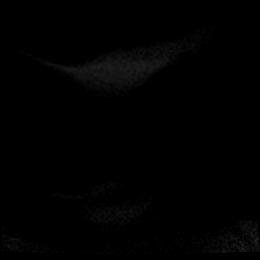

[Series 8: DWI · coronal · 4.0mm · 0.88mm/px · 1 of 42 slices shown (4 of 4)]
[im 1/42]
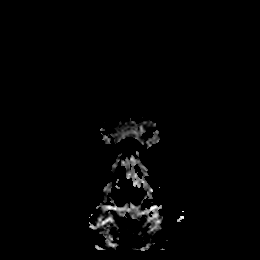

[Series 9: T1 · sagittal · 5.0mm · 0.78mm/px · 1 of 29 slices shown (1 of 2)]
[im 1/29]
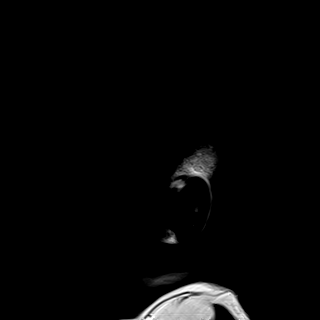

[Series 10: T2 · axial · 5.0mm · 0.75mm/px · 1 of 29 slices shown (1 of 3)]
[im 1/29]
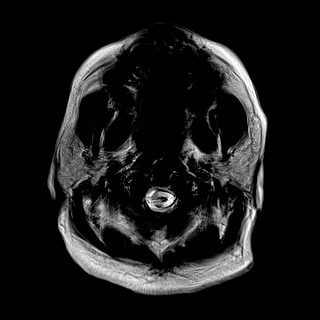

[Series 11: FLAIR · axial · 5.0mm · 0.94mm/px · 1 of 29 slices shown (1 of 2)]
[im 1/29]
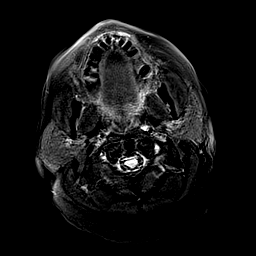

[Series 12: T1 · sagittal · 5.0mm · 0.94mm/px · 1 of 27 slices shown (2 of 2)]
[im 1/27]
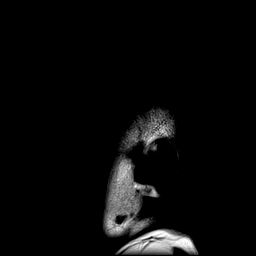

[Series 13: mag_images · axial · 3.0mm · 0.94mm/px · z∈[-114,+50]mm · 2 of 56 slices shown]
[im 1/56]
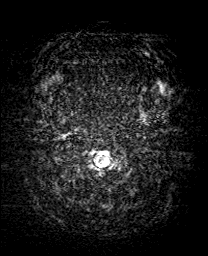
[im 56/56]
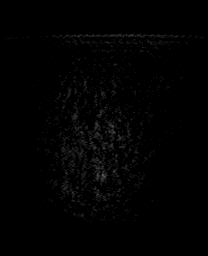

[Series 14: pha_images · axial · 3.0mm · 0.94mm/px · z∈[-114,+47]mm · 2 of 53 slices shown]
[im 1/53]
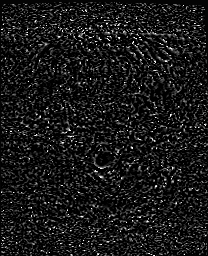
[im 53/53]
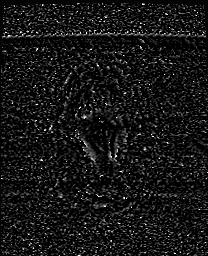

[Series 15: swi_images · axial · 3.0mm · 0.94mm/px · z∈[-114,+50]mm · 2 of 56 slices shown]
[im 1/56]
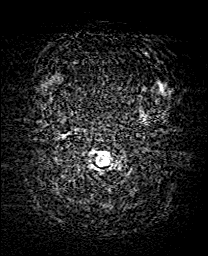
[im 56/56]
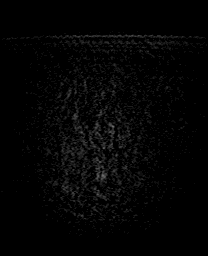

[Series 16: mip_images(sw) · axial · 24.0mm · 0.94mm/px · z∈[-104,+40]mm · 2 of 49 slices shown]
[im 1/49]
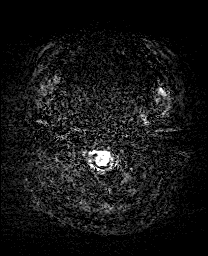
[im 49/49]
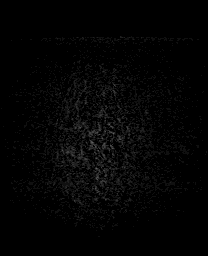

[Series 18: t1_mprage_tra_p2_iso · axial · 1.0mm · 0.98mm/px · z∈[-127,+47]mm · 6 of 176 slices shown (1 of 2)]
[im 1/176]
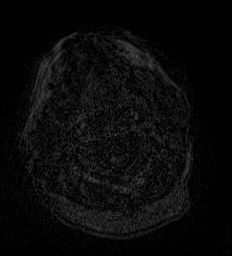
[im 36/176]
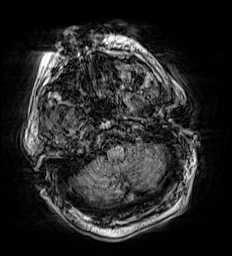
[im 71/176]
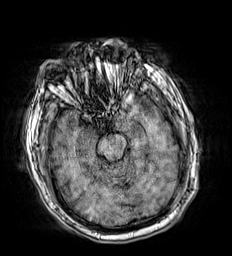
[im 106/176]
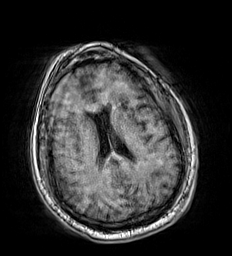
[im 141/176]
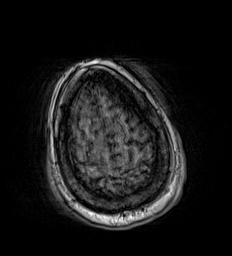
[im 176/176]
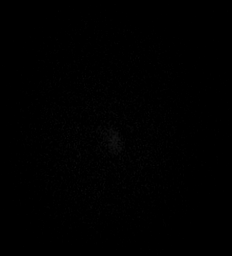

[Series 19: t1_mprage_tra_p2_iso_mpr_coronal · coronal · 1.0mm · 0.45mm/px · 4 of 129 slices shown (1 of 2)]
[im 1/129]
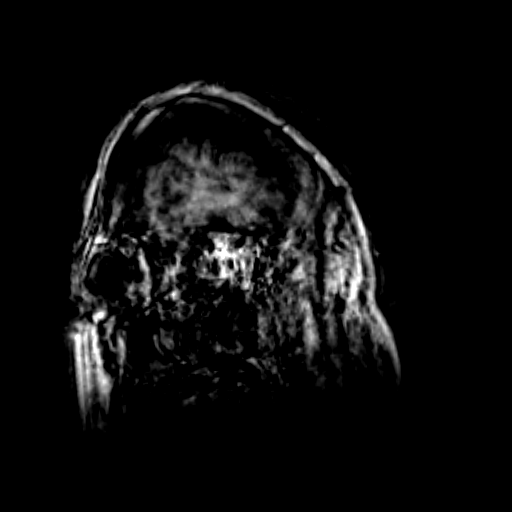
[im 43/129]
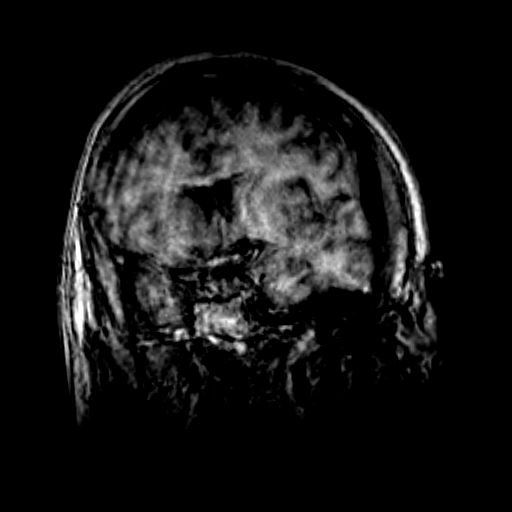
[im 86/129]
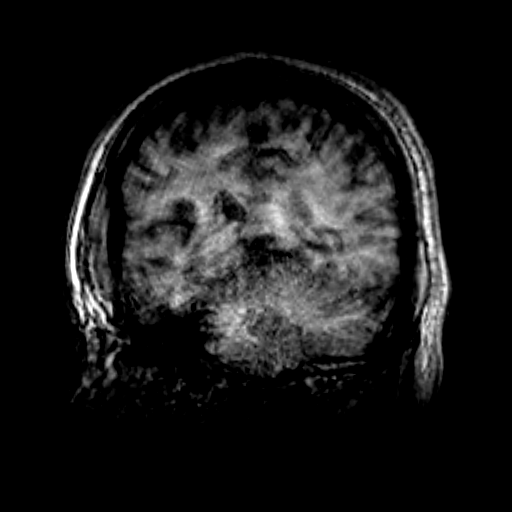
[im 129/129]
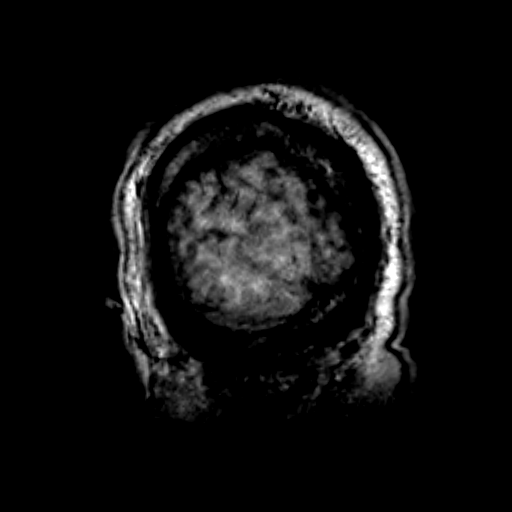

[Series 21: FLAIR · coronal · 3.0mm · 0.62mm/px · 1 of 42 slices shown (2 of 2)]
[im 1/42]
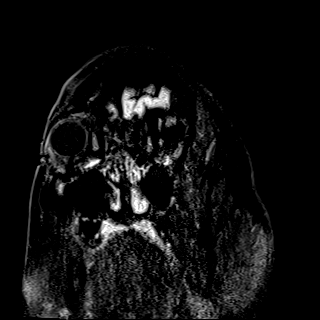

[Series 22: T2 · coronal · 5.0mm · 0.72mm/px · 1 of 34 slices shown (2 of 3)]
[im 1/34]
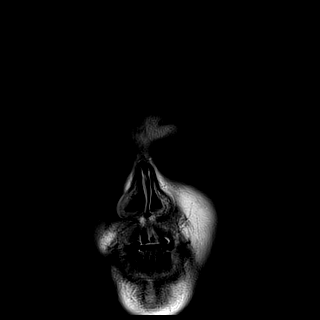

[Series 24: T2 · coronal · 3.0mm · 0.30mm/px · 1 of 41 slices shown (3 of 3)]
[im 1/41]
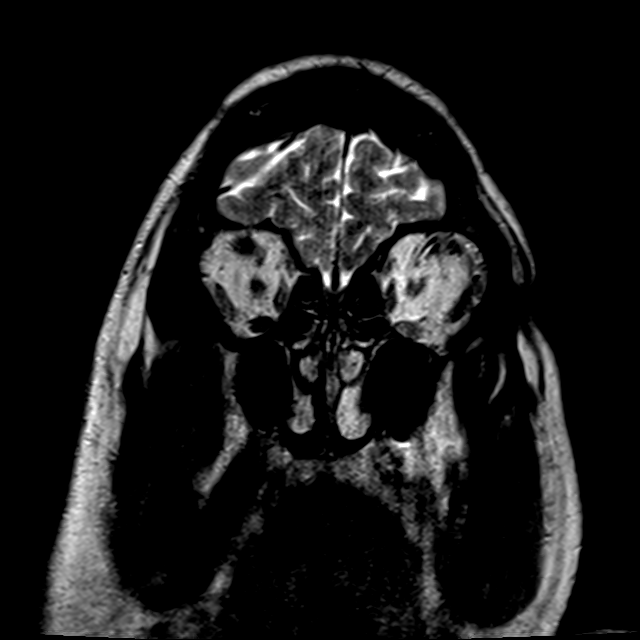

[Series 25: t1_mprage_tra_p2_iso · axial · 1.0mm · 0.98mm/px · z∈[-131,+43]mm · 6 of 176 slices shown (2 of 2)]
[im 1/176]
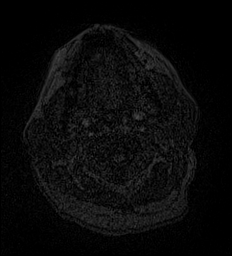
[im 36/176]
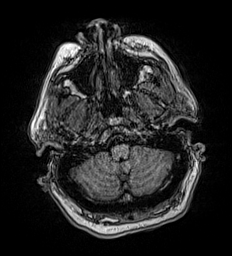
[im 71/176]
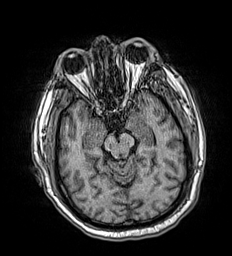
[im 106/176]
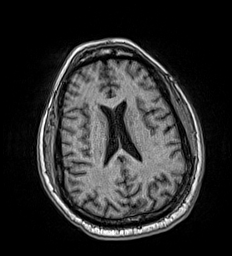
[im 141/176]
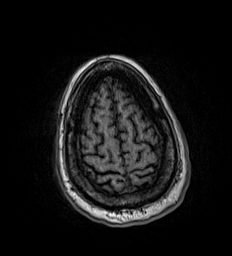
[im 176/176]
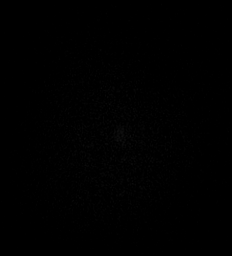

[Series 26: t1_mprage_tra_p2_iso_mpr_coronal · coronal · 1.0mm · 0.45mm/px · 3 of 129 slices shown (2 of 2)]
[im 1/129]
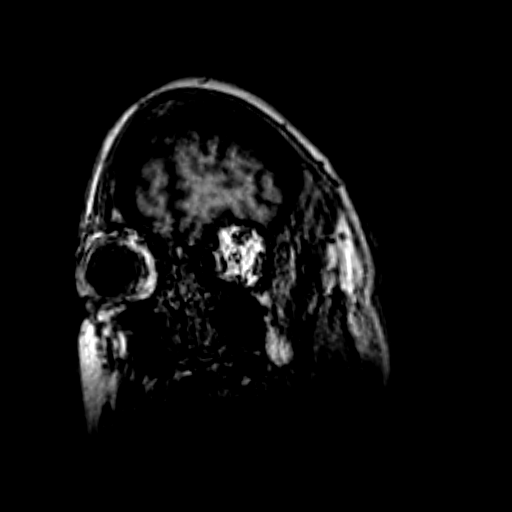
[im 43/129]
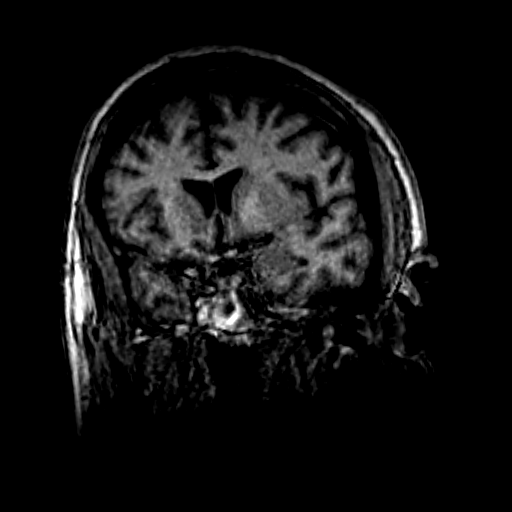
[im 86/129]
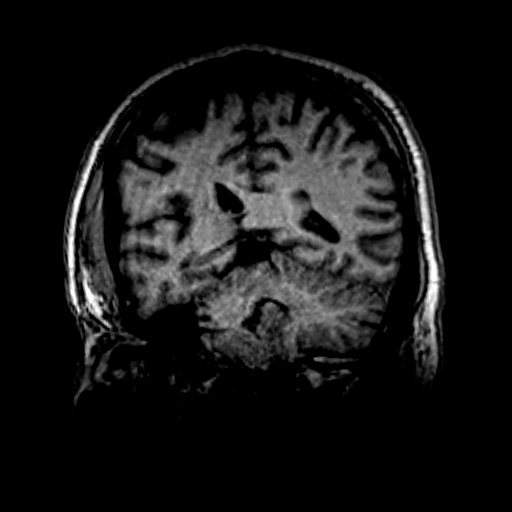

[43 of 48 positions shown; findings below may reference images not displayed]

FINDINGS: Evaluation is somewhat limited by motion artifact.

Brain: No restricted diffusion to suggest acute or subacute infarct.
No acute hemorrhage, mass, mass effect, or midline shift.
Redemonstrated right superior cerebellar encephalomalacia related to
prior infarct with associated hemosiderin deposition, likely remote
petechial hemorrhage. Lacunar infarct in the right caudate body. No
other evidence of remote infarcts. No hydrocephalus or extra-axial
collection. The hippocampi are grossly symmetric in size and normal
in signal, although motion limits evaluation. No heterotopia or
evidence of cortical dysplasia.

Vascular: Normal flow voids.

Skull and upper cervical spine: Normal marrow signal.

Sinuses/Orbits: Mucosal thickening in the left maxillary sinus and
posterior left ethmoid air cells. Otherwise negative.

Other: None
IMPRESSION: Evaluation is limited by motion artifact. Within this limitation, no
acute intracranial process. No definite seizure etiology identified.

## 2021-03-12 IMAGING — US US THYROID
1 series · 12 of 25 positions shown · non-contrast
Comparison: None.

CLINICAL DATA: Nodule

EXAM:
THYROID ULTRASOUND
TECHNIQUE: Ultrasound examination of the thyroid gland and adjacent soft
tissues was performed.

[Series 1: us thyroid · 73 acquisitions, 12 frames shown]
[im 4/73]
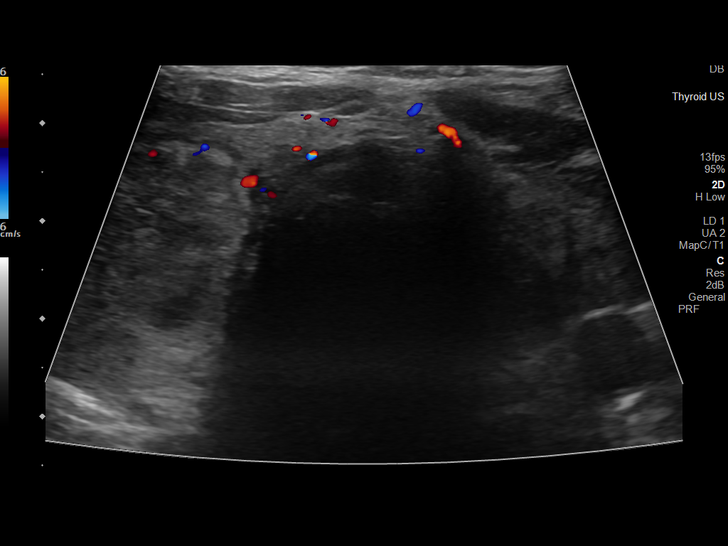
[im 10/73]
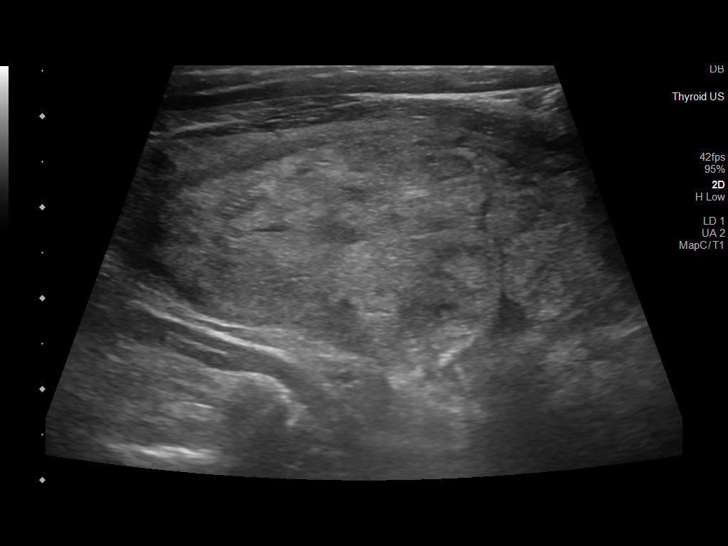
[im 16/73]
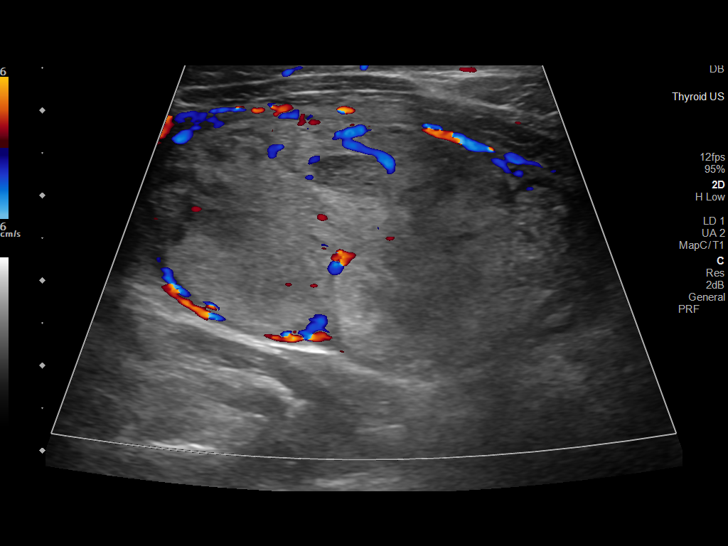
[im 22/73]
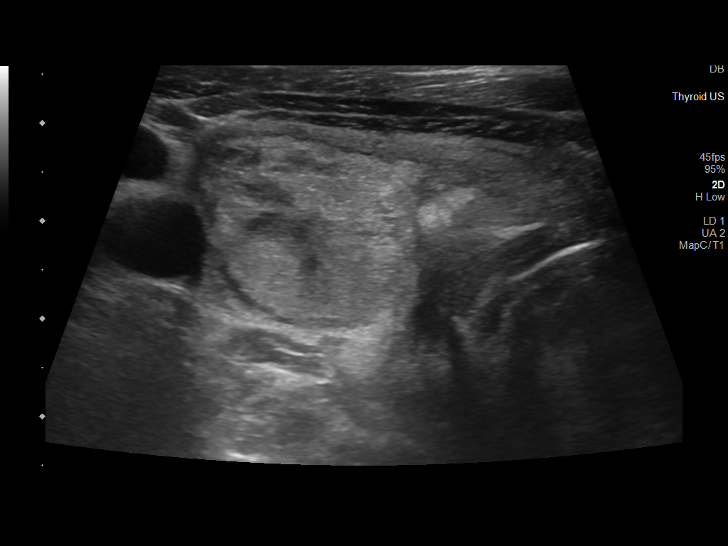
[im 28/73]
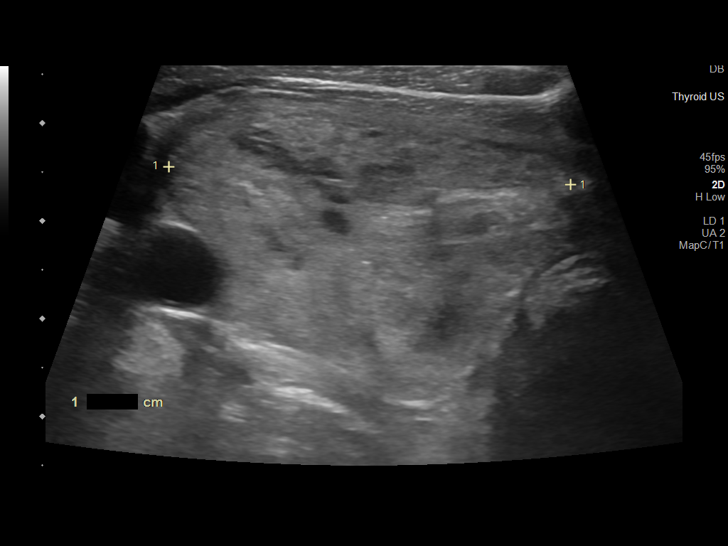
[im 34/73]
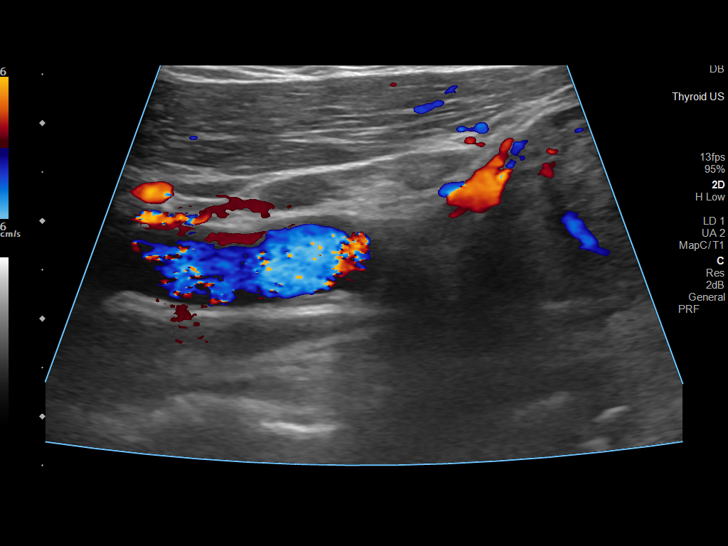
[im 40/73]
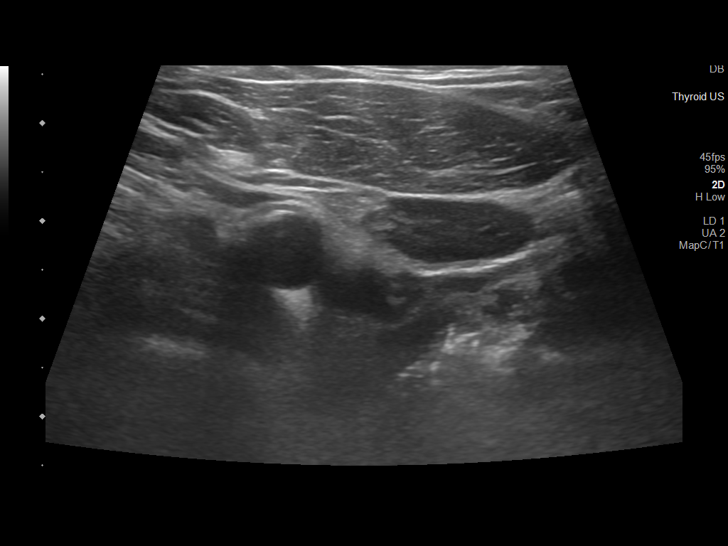
[im 46/73]
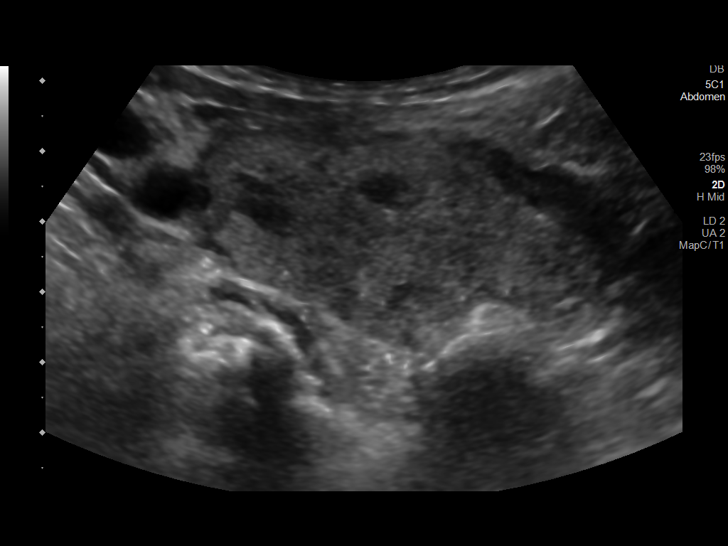
[im 52/73]
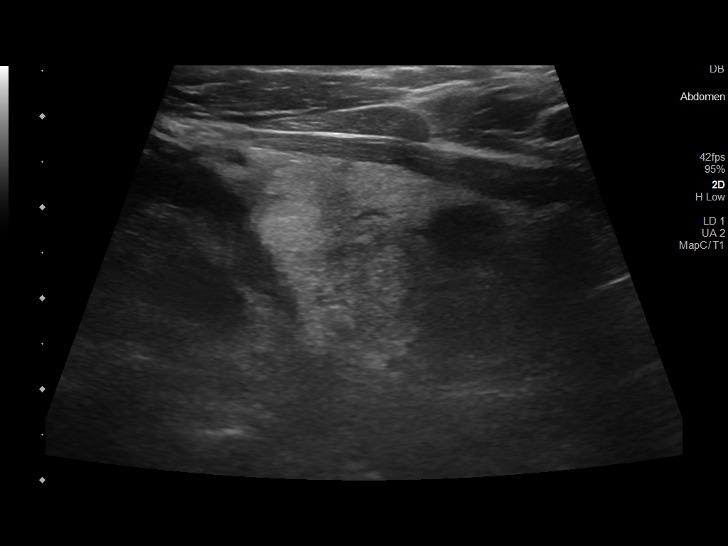
[im 58/73]
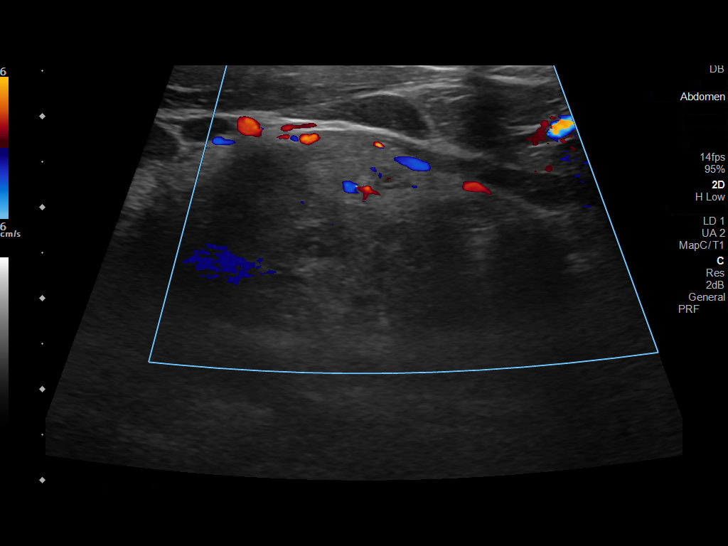
[im 64/73]
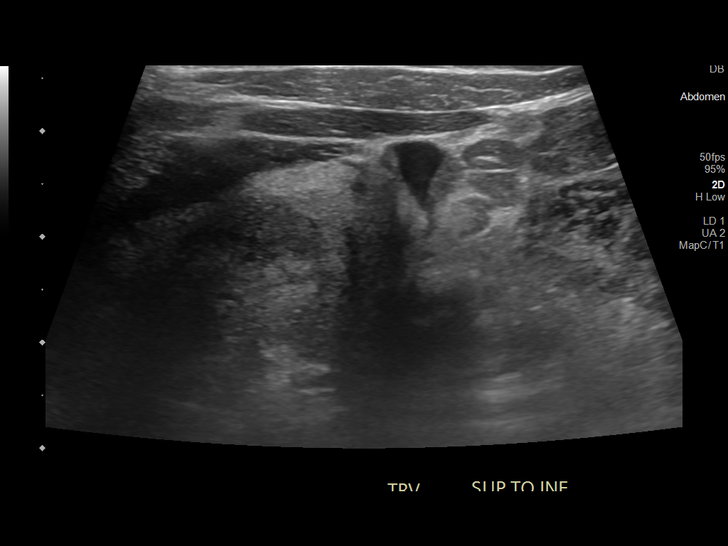
[im 70/73]
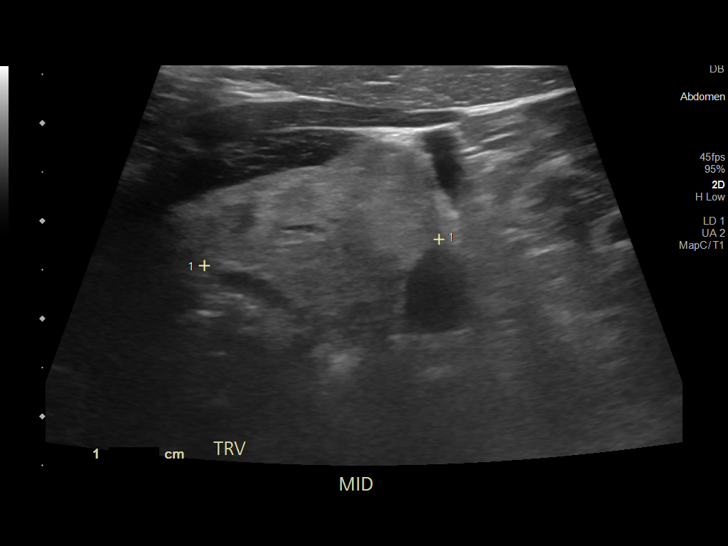

[12 of 25 positions shown; findings below may reference images not displayed]

FINDINGS: Parenchymal Echotexture: Markedly heterogenous

Isthmus: 0.5 cm thickness

Right lobe:   5.9 x 3 x 4.2 cm

Left lobe: 4.8 x 2.5 x 2.4 cm

_________________________________________________________

Estimated total number of nodules >/= 1 cm: 4

Number of spongiform nodules >/=  2 cm not described below (TR1): 0

Number of mixed cystic and solid nodules >/= 1.5 cm not described
below (TR2): 0

_________________________________________________________

Nodule # 1:

Location: Right; mid

Maximum size: 5.2 cm; Other 2 dimensions: 2.8 x 4.1 cm

Composition: solid/almost completely solid (2)

Echogenicity: isoechoic (1)

Shape: not taller-than-wide (0)

Margins: ill-defined (0)

Echogenic foci: none (0)

ACR TI-RADS total points: 3.

ACR TI-RADS risk category: TR 3.

ACR TI-RADS recommendations:

**Given size (>/= 2.5 cm) and appearance, fine needle aspiration of
this mildly suspicious nodule should be considered based on TI-RADS
criteria.

_________________________________________________________

Nodule # 2:

Location: Right; superior

Maximum size: 1.2 cm; Other 2 dimensions: 0.7 x 0.7 cm

Composition: solid/almost completely solid (2)

Echogenicity: hyperechoic (1)

Shape: not taller-than-wide (0)

Margins: ill-defined (0)

Echogenic foci: none (0)

ACR TI-RADS total points: 3.

ACR TI-RADS risk category: TR 3.

ACR TI-RADS recommendations:

Given size (<1.4 cm) and appearance, this nodule does NOT meet
TI-RADS criteria for biopsy or dedicated follow-up.

Nodule # 3:

Location: Left; superior

Maximum size: 1.4 cm; Other 2 dimensions: 1.1 x 1 cm

Composition: solid/almost completely solid (2)

Echogenicity: isoechoic (1)

Shape: not taller-than-wide (0)

Margins: smooth (0)

Echogenic foci: none (0)

ACR TI-RADS total points: 3.

ACR TI-RADS risk category: TR 3.

ACR TI-RADS recommendations:

Given size (<1.4 cm) and appearance, this nodule does NOT meet
TI-RADS criteria for biopsy or dedicated follow-up.

_________________________________________________________

Nodule # 4:

Location: Left; inferior

Maximum size: 1.9 cm; Other 2 dimensions: 1.5 x 1.4 cm

Composition: solid/almost completely solid (2)

Echogenicity: hypoechoic (2)

Shape: not taller-than-wide (0)

Margins: ill-defined (0)

Echogenic foci: none (0)

ACR TI-RADS total points: 4.

ACR TI-RADS risk category: TR 4.

ACR TI-RADS recommendations:

**Given size (>/= 1.5 cm) and appearance, fine needle aspiration of
this moderately suspicious nodule should be considered based on
TI-RADS criteria.

_________________________________________________________

Prominent right cervical lymph node measuring 0.8 cm short axis
diameter.
IMPRESSION: 1. Thyromegaly with bilateral nodules.
2. Recommend FNA biopsy of 1.9 cm moderately suspicious inferior
left nodule AND 5.2 cm mildly suspicious mid right nodule.

The above is in keeping with the ACR TI-RADS recommendations - [HOSPITAL] [BF];[DATE].

## 2021-03-12 MED ORDER — ENSURE ENLIVE PO LIQD
237.0000 mL | Freq: Two times a day (BID) | ORAL | Status: DC
  Administered 2021-03-13 (×2): 237 mL via ORAL

## 2021-03-12 MED ORDER — POTASSIUM CHLORIDE 20 MEQ PO PACK
40.0000 meq | PACK | Freq: Once | ORAL | Status: AC
  Administered 2021-03-13: 40 meq via ORAL
  Filled 2021-03-12: qty 2

## 2021-03-12 MED ORDER — POTASSIUM CHLORIDE 10 MEQ/100ML IV SOLN
10.0000 meq | INTRAVENOUS | Status: AC
  Administered 2021-03-12 (×4): 10 meq via INTRAVENOUS
  Filled 2021-03-12 (×2): qty 100

## 2021-03-12 MED ORDER — ENOXAPARIN SODIUM 40 MG/0.4ML IJ SOSY
40.0000 mg | PREFILLED_SYRINGE | Freq: Every day | INTRAMUSCULAR | Status: DC
  Administered 2021-03-12 – 2021-03-14 (×3): 40 mg via SUBCUTANEOUS
  Filled 2021-03-12 (×3): qty 0.4

## 2021-03-12 MED ORDER — ASPIRIN 300 MG RE SUPP: 300.0000 mg | Freq: Every day | RECTAL | Status: DC

## 2021-03-12 MED ORDER — INSULIN GLARGINE-YFGN 100 UNIT/ML ~~LOC~~ SOLN
12.0000 [IU] | Freq: Every day | SUBCUTANEOUS | Status: DC
  Administered 2021-03-12: 12 [IU] via SUBCUTANEOUS
  Filled 2021-03-12 (×2): qty 0.12

## 2021-03-12 MED ORDER — ATORVASTATIN CALCIUM 80 MG PO TABS
80.0000 mg | ORAL_TABLET | Freq: Every day | ORAL | Status: DC
  Administered 2021-03-12 – 2021-03-14 (×3): 80 mg via ORAL
  Filled 2021-03-12 (×2): qty 1
  Filled 2021-03-12: qty 2

## 2021-03-12 MED ORDER — ASPIRIN 325 MG PO TABS
325.0000 mg | ORAL_TABLET | Freq: Once | ORAL | Status: AC
  Administered 2021-03-12: 325 mg via ORAL
  Filled 2021-03-12: qty 1

## 2021-03-12 MED ORDER — INSULIN ASPART 100 UNIT/ML IJ SOLN
0.0000 [IU] | INTRAMUSCULAR | Status: DC
  Administered 2021-03-12: 15 [IU] via SUBCUTANEOUS
  Administered 2021-03-12: 11 [IU] via SUBCUTANEOUS
  Administered 2021-03-12: 8 [IU] via SUBCUTANEOUS
  Administered 2021-03-13: 15 [IU] via SUBCUTANEOUS
  Administered 2021-03-13: 5 [IU] via SUBCUTANEOUS
  Administered 2021-03-13 (×3): 15 [IU] via SUBCUTANEOUS
  Administered 2021-03-13: 5 [IU] via SUBCUTANEOUS
  Administered 2021-03-13: 11 [IU] via SUBCUTANEOUS
  Administered 2021-03-14: 5 [IU] via SUBCUTANEOUS
  Administered 2021-03-14: 15 [IU] via SUBCUTANEOUS
  Administered 2021-03-14 (×2): 11 [IU] via SUBCUTANEOUS

## 2021-03-12 MED ORDER — POTASSIUM CHLORIDE 20 MEQ PO PACK
40.0000 meq | PACK | Freq: Once | ORAL | Status: AC
  Administered 2021-03-12: 40 meq via ORAL
  Filled 2021-03-12: qty 2

## 2021-03-12 NOTE — ED Notes (Signed)
Report given to Joya Gaskins, RN of [phone]

## 2021-03-12 NOTE — Progress Notes (Signed)
°  Transition of Care [facility]) Screening Note   Patient Details  Name: [name] Date of Birth: [birth year]   Transition of Care [facility]) CM/SW Contact:    Geralynn Ochs, LCSW Phone Number: 03/12/2021, 3:32 PM    Transition of Care Department [facility]) has reviewed patient and no TOC needs have been identified at this time; medical workup ongoing. We will continue to monitor patient advancement through interdisciplinary progression rounds. If new patient transition needs arise, please place a TOC consult.

## 2021-03-12 NOTE — ED Notes (Signed)
EEG at bedside currently

## 2021-03-12 NOTE — Hospital Course (Addendum)
[name] is a 61 y.o. male who was admitted to the [place] at [facility] for expressive aphasia concerning for possible TIA. Hospital course is outlined below by system.    Aphasia concern for possible TIA Patient presented to the ED with complaint of aphasia, difficulty speaking or writing or reading words.   On examination patient appeared overall without neurologic deficits except for aphasia and difficulty with tongue protrusion. Was able to comprehend everything when speaking to him.  Had difficulty with word finding and difficulty with writing/texting. Head CT on admission showed no acute abnormalities other than severe narrowing of the left V4 and MRI was unremarkable.  Neurology were consulted who recommended obtaining routine EEG which came back normal.  Patient's presentation on findings very suspicious of possible TIA.  He has history of CVA and was on Plavix and aspirin.  Plavix was recently discontinued due to plans for CABG by his cardiology prior to his new symptoms. At discharge, patient was restarted on both Aspirin and Plavix to minimize risk of recurrent cardiac or neurological event. Cardiothoracic surgery will follow-up with patient regarding when Plavix should be held ahead of procedure.  Hyperglycemia   type 2 diabetes On admission patient's blood glucose was 554 with an A1c>15.  His home medication includes 18 units Semglee twice daily.  During admission patient was started on 12u Semglee and was slowly increased to 25u twice daily.  CBGs was closely monitored with daily CBGs and BMP.  While hospitalized he was also on moderate sliding scale insulin which was titrated based on CBGs. Our diabetes coordinator met with him prior to discharge to fit him with a CGM so that his insulin can be further titrated ahead of his CABG in coming weeks.   CAD s/p PCI   Hx of MI Patient recently had an NSTEMI and CAD with severe multivessel CAD requiring hospitalization in  November, 2022.  She is currently followed by Dr. Johney Frame.  Plan is to perform a CABG grafts and TEE 03/15/2021.  Patient was recently advised to discontinue Plavix in anticipation for his CABG procedure.  However given his recent TIA patient, CABG was rescheduled to later this month and Plavix was restarted upon discharge as discussed above.   Enlarged thyroid lobe/nodules Head CT showed enlargement of right thyroid lobe with multiple hypoattenuating nodules. He was asymptomatic. TSH was normal. Thyroid ultrasound showed thyromegaly with bilateral nodules. We did not feel that this was contributing to his symptoms, so will defer further workup to the outpatient setting.    PCP follow-up recommendations TSH was within normal limits. Thyroid ultrasound notable for 1.9 cm moderately suspicious inferior left nodule AND 5.2 cm mildly suspicious mid right nodule. Recommended to have FNA biopsy outpatient.  Please ensure that patient maintains neurology outpatient follow up. Discharged on both plavix and aspirin, please ensure patient follows up with cardiothoracic surgery to reschedule CABG.  Repeat A1c and monitor glucose levels, adjust diabetic regimen as appropriate. Patient will be discharged with a CGM to provide ample data points for future titration.

## 2021-03-12 NOTE — Progress Notes (Addendum)
LTM EEG hooked up and running - no initial skin breakdown - push button tested - neuro notified.  No routine performed, per consult note

## 2021-03-12 NOTE — Evaluation (Signed)
Speech Language Pathology Evaluation Patient Details Name: [name] MRN: [id number] DOB: [birth year] Today's Date: 03/12/2021 Time: 3016-0109 SLP Time Calculation (min) (ACUTE ONLY): 22 min  Problem List:  Patient Active Problem List   Diagnosis Date Noted   Dysphasia 03/12/2021   Current use of insulin ([place]) 03/12/2021   Encephalomalacia    History of cerebral embolism with cerebral infarction 12/28/2020   History of MI (myocardial infarction) 03/19/2018   Status 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 3 03/23/2015   Diabetes mellitus type 2 with neurological manifestations ([place])    Tobacco abuse    Obesity    Hypertension associated with diabetes ([place]) 03/16/2015   Hyperlipidemia associated with type 2 diabetes mellitus ([place]) 03/16/2015   Past Medical History:  Past Medical History:  Diagnosis Date   AKI (acute kidney injury) ([place])    CAD (coronary artery disease)    a. 03/2015 NSTEMI: LHC with severe 3V CAD  (70% mid RCA, 95% OM1, 90% distal LCx, 90% OM3, 80% prox LAD and 90% ost D1) s/p DES to mLAD w/ small dissction Rx with DES, staged ost Ramus PCI/DES and dLCx s/p PCI/DES    Chest pain 12/24/2020   Diabetes mellitus type 2 in obese ([place])    Diverticulosis    Dyspnea on exertion 03/16/2015   Dyspnea on exertion   Hypercholesteremia    Hypertension    Hypertension associated with diabetes ([place]) 03/16/2015   hypertension   NSTEMI (non-ST elevated myocardial infarction) ([place]) 03/17/2015   Obesity    Tobacco abuse    Past Surgical History:  Past Surgical History:  Procedure Laterality Date   CARDIAC CATHETERIZATION N/A 03/19/2015   Procedure: Left Heart Cath and Coronary Angiography;  Surgeon: Lorretta Harp, MD;  Location: [place] [facility];  Service: Cardiovascular;  Laterality: N/A;   CARDIAC CATHETERIZATION N/A 03/20/2015   Procedure: Coronary Stent Intervention;  Surgeon: Lorretta Harp, MD;  Location: [place] [facility];  Service: Cardiovascular;   Laterality: N/A;   CARDIAC CATHETERIZATION N/A 03/22/2015   Procedure: Coronary Stent Intervention;  Surgeon: Lorretta Harp, MD;  Location: [place] [facility];  Service: Cardiovascular;  Laterality: N/A;   LEFT HEART CATH AND CORONARY ANGIOGRAPHY N/A 12/25/2020   Procedure: LEFT HEART CATH AND CORONARY ANGIOGRAPHY;  Surgeon: Troy Sine, MD;  Location: [place] [facility];  Service: Cardiovascular;  Laterality: N/A;   LEFT HEART CATH AND CORONARY ANGIOGRAPHY N/A 12/26/2020   Procedure: LEFT HEART CATH AND CORONARY ANGIOGRAPHY;  Surgeon: Troy Sine, MD;  Location: [place] [facility];  Service: Cardiovascular;  Laterality: N/A;   HPI:  Pt is a 61 yo male presenting with aphasia. MRI limited by motion artifact but without acute changes identified. PMH includes: CVA 12/2020 (cognitive-linguistic eval completed at that time with pt felt to be at his baseline except for minimally dysarthric speech at the conversational level and with multisyllabic words), HTN, T2DM, CAD s/p PCI, CKD, history of NSTEMI tobacco use, EtOH use history   Assessment / Plan / Recommendation Clinical Impression  Pt has made a lot of improvement since time of arrival, but he does still have mild cognitive-linguistic impairments. He believes that his rate of speech is slower than it normally is, and that he has to speak this way so that his words will come out more clearly. His intelligibility is good even at the conversational level with use of this strategy. He does also have a few phonemic and word-finding errors that  are occasional mixed into conversation. This is also noted in his reading at the sentence and paragraph level, with mild subsequent impact on comprehension. Pt also has some difficulty with delayed recall. He says that he lives with his uncle, who could provide supervision during higher level cognitive and communicative tasks. Recommend OP SLP and intermittent supervision upon discharge if symptoms still  persist.    SLP Assessment  SLP Recommendation/Assessment: Patient needs continued Speech Lanaguage Pathology Services SLP Visit Diagnosis: Cognitive communication deficit (R41.841)    Recommendations for follow up therapy are one component of a multi-disciplinary discharge planning process, led by the attending physician.  Recommendations may be updated based on patient status, additional functional criteria and insurance authorization.    Follow Up Recommendations  Outpatient SLP    Assistance Recommended at Discharge  Intermittent Supervision/Assistance  Functional Status Assessment Patient has had a recent decline in their functional status and demonstrates the ability to make significant improvements in function in a reasonable and predictable amount of time.  Frequency and Duration min 2x/week  1 week      SLP Evaluation Cognition  Overall Cognitive Status: Impaired/Different from baseline Arousal/Alertness: Awake/alert Orientation Level: Oriented X4 Attention: Sustained Sustained Attention: Appears intact Memory: Impaired Memory Impairment: Decreased recall of new information;Retrieval deficit Awareness: Appears intact       Comprehension  Auditory Comprehension Overall Auditory Comprehension: Impaired Commands: Within Functional Limits Conversation: Simple Interfering Components: Working memory EffectiveTechniques: Extra processing time;Repetition Reading Comprehension Reading Status: Impaired Paragraph Level: Impaired Effective Techniques: Eye glasses    Expression Expression Primary Mode of Expression: Verbal Verbal Expression Overall Verbal Expression: Impaired Initiation: No impairment Automatic Speech: Name;Social Response Level of Generative/Spontaneous Verbalization: Conversation Naming: Impairment Verbal Errors: Phonemic paraphasias;Aware of errors Non-Verbal Means of Communication: Not applicable   Oral / Motor  Oral Motor/Sensory  Function Overall Oral Motor/Sensory Function: Within functional limits Motor Speech Overall Motor Speech: Impaired (pt says his rate is slower than it normally is) Respiration: Within functional limits Phonation: Normal Resonance: Within functional limits Articulation: Within functional limitis            Osie Bond., M.A. [place] [facility] Pager [phone] Office [phone]   03/12/2021, 10:49 AM

## 2021-03-12 NOTE — Progress Notes (Signed)
PT Cancellation Note  Patient Details Name: [name] MRN: [id number] DOB: 05-05-1960   Cancelled Treatment:    Reason Eval/Treat Not Completed: Patient at procedure or test/unavailable.  Having EEG on last two attempts, recheck as time and pt allow.    Ramond Dial 03/12/2021, 12:58 PM  Mee Hives, PT PhD Acute Rehab Dept. Number: [place] [facility] [place]

## 2021-03-12 NOTE — ED Notes (Signed)
Pt returned from MRI a short while ago. He wanted to speak to this nurse, and he was able to communicate much better. Pt able to speak full sentences and clearly articulate when he slows down his speech. Pt has occasional puffing out of his cheeks and word salad. Provider made aware of changes.

## 2021-03-12 NOTE — Procedures (Addendum)
Patient Name: [name]  MRN: [id number]  Epilepsy Attending: Lora Havens  Referring Physician/Provider: Donnetta Simpers, MD Duration: 03/12/2021 4834 to  03/13/2021 7583  Patient history:  61 y.o. male presenting with acute onset expressive worse than sensory aphasia. EEG to evaluate for seizure  Level of alertness: Awake, asleep  AEDs during EEG study: None  Technical aspects: This EEG study was done with scalp electrodes positioned according to the 10-20 International system of electrode placement. Electrical activity was acquired at a sampling rate of 500Hz  and reviewed with a high frequency filter of 70Hz  and a low frequency filter of 1Hz . EEG data were recorded continuously and digitally stored.   Description: The posterior dominant rhythm consists of 9 Hz activity of moderate voltage (25-35 uV) seen predominantly in posterior head regions, symmetric and reactive to eye opening and eye closing. Sleep was characterized by vertex waves, sleep spindles (12 to 14 Hz), maximal frontocentral region. Sleep was characterized by vertex waves, sleep spindles (12 to 14 Hz), maximum frontocentral region. Hyperventilation and photic stimulation were not performed.     Of note, parts of study were difficult interpret due to significant electrode artifact.  IMPRESSION: This study is within normal limits. No seizures or epileptiform discharges were seen throughout the recording.  Giordan Fordham Barbra Sarks

## 2021-03-12 NOTE — Progress Notes (Signed)
Family Medicine Teaching Service Daily Progress Note Intern Pager: [phone]  Patient name: [name] Medical record number: [id number] Date of birth: July 14, 1960 Age: 61 y.o. Gender: male  Primary Care Provider: [name], MD Consultants: Neurology Code Status: Full  Pt Overview and Major Events to Date:  1/30-admitted  Assessment and Plan:  [name] is a 61 year old male who presented with aphasia.  Images significant for CVA 2022, HTN, T2DM, CAD s/p PCI, CKD, Hx of NSTEMI, tobacco use, alcohol use.  Expressive aphasia   Possible TIA Patient presented to the ED with acute aphasia.  Head CT on admission showed no acute abnormalities but severe narrowing in the left V4 and MRI was unremarkable with no signs of seizure etiology.  Today patient reports that he is doing much better and his speech is back to baseline.  Neurology following who recommended management of hypoglycemia, loading dose of IV Keppra 2000 mg, permissive hypertension to 210/110 and routine EEG to assess seizure etiology.  EEG was normal.  Overall given patient's presentation, unremarkable imagining and test is very suspicious of possible TIA. -Neurology following, appreciate recs -Follow-up EEG -Seizure precautions -Neurochecks every 4 hours -Passed swallow swallow, start carb modified diet. -Permissive hypertension to 220/110 -SLP eval for swallowing/aphasia -PT/OT eval and treat  Hyperglycemia   T2DM On admission blood glucose of 554 with A1c >15. This morning CBG has ranged from 342-425.  Patient is on home medication of 18 units Semglee twice daily. -CBG checks every 4 hours -Start 12 units semglee. -Continue morning lab, BMP -Start mSSI  Hypertension BP has ranged from 156-201/92-117.  This morning BP is 182/94.  Home meds includes amlodipine 10 mg daily, and Lasix as needed for edema. -BP meds for permissive hypertension -Permissive hypertension 220/110 -Continue routine vitals  CAD s/p PCI    history of MI NSTEMI CAD back in 7/22.  Planned CABG grafts surgery for 2/30/23.  Home medication includes Imdur 90 mg daily, nitroglycerin as needed for chest pain, atorvastatin 80 mg daily, aspirin 325 mg daily and Plavix. Lipid panel showed cholesterol 250, triglycerides 538, HDL 31 and LDL 114.3. -Restart home meds tomorrow for permissive hypertension.  HFpEF last Last echo on 12/24/2020 showed EF of 55-60% with grade 1 diastolic dysfunction.  Exam appears euvolemic.  Home medication include Lasix 20 mg as needed for edema. -Holding home Lasix -Strict I's and O's -Daily weight  Enlarged thyroid on head CT  On admission head CT showed enlarged right thyroid lobe with multiple hypoattenuating nodules. -Ordered TSH level  Order chronic and stable conditions CKD 3A-creatinine today's 1.27, baseline ~1.5 Tobacco use-vapes now EtOH use-reports decreased drink with last drink last Friday.   FEN/GI: N.p.o. PPx: Lovenox Dispo:Home pending clinical improvement .   Subjective:  [name] was awake and laying in bed.  He said he is doing much better and feels great.  His speech is back to baseline and he was very concerned about his speech yesterday.  Objective: Temp:  [98.8 F (37.1 C)] 98.8 F (37.1 C) (01/30 2000) Pulse Rate:  [78-102] 78 (01/31 0530) Resp:  [16-27] 20 (01/31 0530) BP: (156-201)/(92-117) 156/92 (01/31 0530) SpO2:  [94 %-98 %] 95 % (01/31 0530) Physical Exam: General:Awake, well appearing, NAD HEENT: Atraumatic, MMM CV: RRR, no murmurs, normal S1/S2 Pulm: CTAB, good WOB on RA, no crackles or wheezing Abd: Soft, no distension, no tenderness Skin: dry, warm Ext: No BLE edema Neuro: Oriented x3, no neurofocal deficit, speech Psych: Normal affect   Laboratory: Recent Labs  Lab 03/11/21 2007 03/11/21 2016 03/12/21 0311  WBC 7.7  --  8.8  HGB 16.0 16.7 14.5  HCT 45.0 49.0 41.1  PLT 225  --  205   Recent Labs  Lab 03/11/21 2007 03/11/21 2016  03/12/21 0500  NA 129* 131* 135  K 3.1* 3.1* 3.1*  CL 89* 91* 97*  CO2 27  --  26  BUN 10 11 8   CREATININE 1.46* 1.30* 1.29*  CALCIUM 9.3  --  8.4*  PROT 7.4  --  6.2*  BILITOT 1.1  --  0.9  ALKPHOS 177*  --  129*  ALT 14  --  10  AST 15  --  13*  GLUCOSE 545* 578* 322*    MR BRAIN WO CONTRAST  Result Date: 03/12/2021 CLINICAL DATA:  Seizure, new onset recent SCA stroke in 12/2020 EXAM: MRI HEAD WITHOUT CONTRAST TECHNIQUE: Multiplanar, multiecho pulse sequences of the brain and surrounding structures were obtained without intravenous contrast. COMPARISON:  12/28/2020 MRI, correlation is also made with CT head and CTA head 03/11/2021. FINDINGS: Evaluation is somewhat limited by motion artifact. Brain: No restricted diffusion to suggest acute or subacute infarct. No acute hemorrhage, mass, mass effect, or midline shift. Redemonstrated right superior cerebellar encephalomalacia related to prior infarct with associated hemosiderin deposition, likely remote petechial hemorrhage. Lacunar infarct in the right caudate body. No other evidence of remote infarcts. No hydrocephalus or extra-axial collection. The hippocampi are grossly symmetric in size and normal in signal, although motion limits evaluation. No heterotopia or evidence of cortical dysplasia. Vascular: Normal flow voids. Skull and upper cervical spine: Normal marrow signal. Sinuses/Orbits: Mucosal thickening in the left maxillary sinus and posterior left ethmoid air cells. Otherwise negative. Other: None IMPRESSION: Evaluation is limited by motion artifact. Within this limitation, no acute intracranial process. No definite seizure etiology identified. Electronically Signed   By: Merilyn Baba M.D.   On: 03/12/2021 02:29   CT HEAD CODE STROKE WO CONTRAST  Addendum Date: 03/11/2021   ADDENDUM REPORT: 03/11/2021 20:36 ADDENDUM: Hyperdense focus in the left sylvian fissure (series 3, image 13), concerning for thrombus in the left M2. Initial  code stroke imaging results were communicated on 03/11/2021 at 8:15 pm to provider Dr. Lorrin Goodell via secure text paging. Addendum was discussed with him at 8:34 p.m. on 03/11/2021 by telephone. Electronically Signed   By: Merilyn Baba M.D.   On: 03/11/2021 20:36   Result Date: 03/11/2021 CLINICAL DATA:  Code stroke.  Slurred speech, hypertension EXAM: CT HEAD WITHOUT CONTRAST TECHNIQUE: Contiguous axial images were obtained from the base of the skull through the vertex without intravenous contrast. RADIATION DOSE REDUCTION: This exam was performed according to the departmental dose-optimization program which includes automated exposure control, adjustment of the mA and/or kV according to patient size and/or use of iterative reconstruction technique. COMPARISON:  12/27/2020 CT, correlation is also made with 12/28/2020 MRI FINDINGS: Brain: No evidence of acute infarction, hemorrhage, cerebral edema, mass, mass effect, or midline shift. Hypodensity in the right superior cerebellum, consistent with evolution of the previously noted right cerebellar infarct on 12/28/2020. Ventricles and sulci are normal for age. No extra-axial fluid collection. Vascular: No hyperdense vessel or unexpected calcification. Skull: Normal. Negative for fracture or focal lesion. Mucosal thickening in the posterior left ethmoid air cells. Otherwise negative.: No acute finding. Other: The mastoid air cells are well aerated. ASPECTS [facility] Stroke Program Early CT Score) - Ganglionic level infarction (caudate, lentiform nuclei, internal capsule, insula, M1-M3 cortex): 7 - Supraganglionic infarction (M4-M6  cortex): 3 Total score (0-10 with 10 being normal): 10 IMPRESSION: 1. No acute intracranial process. 2. ASPECTS is 10 3. Encephalomalacia in the superior right cerebellum, consistent with expected evolution of previously noted right cerebellar infarct. Electronically Signed: By: Merilyn Baba M.D. On: 03/11/2021 20:23   CT ANGIO HEAD NECK  W WO CM W PERF (CODE STROKE)  Result Date: 03/11/2021 CLINICAL DATA:  Slurred speech, hypertension EXAM: CT ANGIOGRAPHY HEAD AND NECK CT PERFUSION BRAIN TECHNIQUE: Multidetector CT imaging of the head and neck was performed using the standard protocol during bolus administration of intravenous contrast. Multiplanar CT image reconstructions and MIPs were obtained to evaluate the vascular anatomy. Carotid stenosis measurements (when applicable) are obtained utilizing NASCET criteria, using the distal internal carotid diameter as the denominator. Multiphase CT imaging of the brain was performed following IV bolus contrast injection. Subsequent parametric perfusion maps were calculated using RAPID software. RADIATION DOSE REDUCTION: This exam was performed according to the departmental dose-optimization program which includes automated exposure control, adjustment of the mA and/or kV according to patient size and/or use of iterative reconstruction technique. CONTRAST:  60 mL Omnipaque 350 COMPARISON:  None. 12/28/2020 FINDINGS: CT HEAD FINDINGS For noncontrast findings, please see same day CT head. CTA NECK FINDINGS Aortic arch: Three-vessel arch with a common origin of the brachiocephalic and left common carotid arteries, as well as an aortic origin of the left vertebral artery. No evidence of aneurysm or dissection. Moderate stenosis of the left vertebral artery origin, secondary to calcified and noncalcified plaque, similar to the prior exam. Right carotid system: No evidence of dissection, stenosis (50% or greater) or occlusion. Calcified and noncalcified plaque at the bifurcation, with less than 50% stenosis. Left carotid system: No evidence of dissection, stenosis (50% or greater) or occlusion. Calcified and noncalcified plaque at the bifurcation, with less than 50% stenosis. Vertebral arteries: Moderate stenosis at the aortic origin of the left vertebral artery, which is irregular but otherwise patent through  the V3 segment. Mild narrowing at the origin of the right vertebral artery, which is otherwise patent through the V3 segment. Skeleton: No acute osseous abnormality. Other neck: Redemonstrated enlargement of the right thyroid lobe, with multiple hypoattenuating nodules. Upper chest: Negative. Review of the MIP images confirms the above findings CTA HEAD FINDINGS Anterior circulation: Redemonstrated multifocal calcified and noncalcified narrowing of the right intracranial ICA, which remains severe in the cavernous segment and supraclinoid/ophthalmic segments, unchanged. Severe narrowing in the distal left cavernous and supraclinoid ICA is again seen. Patent left A1. Diffuse moderate narrowing of the right A1. Normal anterior communicating artery. Redemonstrated irregularity of the distal ACAs, which remain patent. Redemonstrated mild irregularity in the M1 segments. Normal MCA bifurcations. Distal MCA segments are irregular but perfused. Posterior circulation: Redemonstrated severe narrowing in the proximal left V4 and non opacification of the distal left V4, with reconstitution proximal to the vertebrobasilar junction, favored to be retrograde. Moderate multifocal narrowing in the right V4. Severe narrowing of the basilar artery, unchanged from the prior exam. Severe narrowing at the origin of the right superior cerebellar artery. The left superior cerebellar artery is patent. Patent right posterior communicating artery, with increased stenosis in the right P1 segment, which is poorly visualized on the current exam. The right P2 and P3 segments are otherwise patent, although somewhat irregular. Patent left P1 segment. Patent left posterior communicating artery. The left P2 and P3 segments are patent, although somewhat irregular. Venous sinuses: As permitted by contrast timing, patent. Anatomic variants: None significant CT  BRAIN PERFUSION FINDINGS: ASPECTS: 10 CBF (<30%) Volume: 58mL Perfusion (Tmax>6.0s) volume: 79mL  Mismatch Volume: 63mL Infarction Location:None IMPRESSION: 1. No acute intracranial process or acute large vessel occlusion. No core infarct on CT perfusion. 2. Redemonstrated atherosclerotic narrowing of the intracranial vasculature, with severe narrowing in the left V4, focal occlusion, and distal reconstitution; moderate focal narrowing in the right V4; multifocal narrowing of the bilateral intracranial internal carotid arteries, which is severe at points; and moderate narrowing of the right A1; with otherwise irregular distal anterior and posterior circulation 3. Unchanged moderate stenosis at the origin of the left vertebral artery and mild narrowing at the origin of the right vertebral artery. No other hemodynamically significant stenosis in the neck. 4. Redemonstrated enlarged right thyroid lobe, with multiple hypoattenuating nodules. If this has not previously been evaluated, an ultrasound of the thyroid is recommended. Electronically Signed   By: Merilyn Baba M.D.   On: 03/11/2021 21:20     Imaging/Diagnostic Tests: See above  Alen Bleacher, MD 03/12/2021, 7:43 AM PGY-1, [place] Intern pager: [phone], text pages welcome

## 2021-03-12 NOTE — Evaluation (Signed)
Clinical/Bedside Swallow Evaluation Patient Details  Name: [name] MRN: [id number] Date of Birth: [birth year]  Today's Date: 03/12/2021 Time: SLP Start Time (ACUTE ONLY): 0902 SLP Stop Time (ACUTE ONLY): 0915 SLP Time Calculation (min) (ACUTE ONLY): 13 min  Past Medical History:  Past Medical History:  Diagnosis Date   AKI (acute kidney injury) ([place])    CAD (coronary artery disease)    a. 03/2015 NSTEMI: LHC with severe 3V CAD  (70% mid RCA, 95% OM1, 90% distal LCx, 90% OM3, 80% prox LAD and 90% ost D1) s/p DES to mLAD w/ small dissction Rx with DES, staged ost Ramus PCI/DES and dLCx s/p PCI/DES    Chest pain 12/24/2020   Diabetes mellitus type 2 in obese ([place])    Diverticulosis    Dyspnea on exertion 03/16/2015   Dyspnea on exertion   Hypercholesteremia    Hypertension    Hypertension associated with diabetes ([place]) 03/16/2015   hypertension   NSTEMI (non-ST elevated myocardial infarction) ([place]) 03/17/2015   Obesity    Tobacco abuse    Past Surgical History:  Past Surgical History:  Procedure Laterality Date   CARDIAC CATHETERIZATION N/A 03/19/2015   Procedure: Left Heart Cath and Coronary Angiography;  Surgeon: Lorretta Harp, MD;  Location: [facility];  Service: Cardiovascular;  Laterality: N/A;   CARDIAC CATHETERIZATION N/A 03/20/2015   Procedure: Coronary Stent Intervention;  Surgeon: Lorretta Harp, MD;  Location: [facility];  Service: Cardiovascular;  Laterality: N/A;   CARDIAC CATHETERIZATION N/A 03/22/2015   Procedure: Coronary Stent Intervention;  Surgeon: Lorretta Harp, MD;  Location: [facility];  Service: Cardiovascular;  Laterality: N/A;   LEFT HEART CATH AND CORONARY ANGIOGRAPHY N/A 12/25/2020   Procedure: LEFT HEART CATH AND CORONARY ANGIOGRAPHY;  Surgeon: Troy Sine, MD;  Location: [facility];  Service: Cardiovascular;  Laterality: N/A;   LEFT HEART CATH AND CORONARY ANGIOGRAPHY N/A 12/26/2020   Procedure: LEFT HEART CATH AND  CORONARY ANGIOGRAPHY;  Surgeon: Troy Sine, MD;  Location: [facility];  Service: Cardiovascular;  Laterality: N/A;   HPI:  [name] is a 61 yo male presenting with aphasia. MRI limited by motion artifact but without acute changes identified. PMH includes: CVA 12/2020 (cognitive-linguistic eval completed at that time with [name] felt to be at his baseline except for minimally dysarthric speech at the conversational level and with multisyllabic words), HTN, T2DM, CAD s/p PCI, CKD, history of NSTEMI tobacco use, EtOH use history    Assessment / Plan / Recommendation  Clinical Impression  [name]'s oropharyngeal swallowing appears to be functional with no overt signs of dysphagia or aspiration observed. [name] acknowledges that he had some difficulty managing saliva when he came in, but that he believes all of his symptoms are improving. Recommend regular solids and thin liquids. SLP to sign off for dysphagia. SLP Visit Diagnosis: Dysphagia, unspecified (R13.10)    Aspiration Risk  Mild aspiration risk    Diet Recommendation Regular;Thin liquid   Liquid Administration via: Cup;Straw Medication Administration: Whole meds with liquid Supervision: Patient able to self feed Postural Changes: Seated upright at 90 degrees    Other  Recommendations Oral Care Recommendations: Oral care BID    Recommendations for follow up therapy are one component of a multi-disciplinary discharge planning process, led by the attending physician.  Recommendations may be updated based on patient status, additional functional criteria and insurance authorization.  Follow up Recommendations Outpatient SLP (for communication)  Assistance Recommended at Discharge Set up Supervision/Assistance  Functional Status Assessment Patient has not had a recent decline in their functional status  Frequency and Duration            Prognosis Prognosis for Safe Diet Advancement: Good      Swallow Study   General HPI: [name] is a  61 yo male presenting with aphasia. MRI limited by motion artifact but without acute changes identified. PMH includes: CVA 12/2020 (cognitive-linguistic eval completed at that time with [name] felt to be at his baseline except for minimally dysarthric speech at the conversational level and with multisyllabic words), HTN, T2DM, CAD s/p PCI, CKD, history of NSTEMI tobacco use, EtOH use history Type of Study: Bedside Swallow Evaluation Previous Swallow Assessment: none in chart Diet Prior to this Study: NPO Temperature Spikes Noted: No Respiratory Status: Room air History of Recent Intubation: No Behavior/Cognition: Alert;Cooperative;Pleasant mood Oral Cavity Assessment: Within Functional Limits Oral Care Completed by SLP: No Oral Cavity - Dentition: Adequate natural dentition Vision: Functional for self-feeding Self-Feeding Abilities: Able to feed self Patient Positioning: Upright in bed Baseline Vocal Quality: Normal Volitional Cough: Strong Volitional Swallow: Able to elicit    Oral/Motor/Sensory Function Overall Oral Motor/Sensory Function: Within functional limits   Ice Chips Ice chips: Not tested   Thin Liquid Thin Liquid: Within functional limits Presentation: Cup;Self Fed;Straw    Nectar Thick Nectar Thick Liquid: Not tested   Honey Thick Honey Thick Liquid: Not tested   Puree Puree: Within functional limits Presentation: Self Fed;Spoon   Solid     Solid: Within functional limits Presentation: Self Fed      Osie Bond., M.A. [place] Pager [phone] Office [phone]  03/12/2021,10:21 AM

## 2021-03-12 NOTE — Progress Notes (Signed)
LTM maint complete 

## 2021-03-12 NOTE — Progress Notes (Signed)
Surgical Instructions    Your procedure is scheduled on Friday, February 3rd, 2023.   Report to [facility] Main Entrance "A" at 05:30 A.M., then check in with the Admitting office.  Call this number if you have problems the morning of surgery:  [phone]   If you have any questions prior to your surgery date call [phone]: Open Monday-Friday 8am-4pm    Remember:  Do not eat or drink after midnight the night before your surgery     Take these medicines the morning of surgery with A SIP OF WATER:   amLODipine (NORVASC)  atorvastatin (LIPITOR) isosorbide mononitrate (IMDUR) nitroGLYCERIN (NITROSTAT) - if needed  Follow your surgeon's instructions on when to stop Aspirin and Plavix.  If no instructions were given by your surgeon then you will need to call the office to get those instructions.     As of today, STOP taking any Aspirin (unless otherwise instructed by your surgeon) Aleve, Naproxen, Ibuprofen, Motrin, Advil, Goody's, BC's, all herbal medications, fish oil, and all vitamins.   WHAT DO I DO ABOUT MY DIABETES MEDICATION?   THE NIGHT BEFORE SURGERY, take 9 units of insulin glargine-yfgn (SEMGLEE, YFGN,) - 50% of your regular dose       THE MORNING OF SURGERY,  take 9 units of insulin glargine-yfgn (SEMGLEE, YFGN,) - 50% of your regular dose       HOW TO MANAGE YOUR DIABETES BEFORE AND AFTER SURGERY  Why is it important to control my blood sugar before and after surgery? Improving blood sugar levels before and after surgery helps healing and can limit problems. A way of improving blood sugar control is eating a healthy diet by:  Eating less sugar and carbohydrates  Increasing activity/exercise  Talking with your doctor about reaching your blood sugar goals High blood sugars (greater than 180 mg/dL) can raise your risk of infections and slow your recovery, so you will need to focus on controlling your diabetes during the weeks before surgery. Make sure that the  doctor who takes care of your diabetes knows about your planned surgery including the date and location.  How do I manage my blood sugar before surgery? Check your blood sugar at least 4 times a day, starting 2 days before surgery, to make sure that the level is not too high or low.  Check your blood sugar the morning of your surgery when you wake up and every 2 hours until you get to the Short Stay unit.  If your blood sugar is less than 70 mg/dL, you will need to treat for low blood sugar: Do not take insulin. Treat a low blood sugar (less than 70 mg/dL) with  cup of clear juice (cranberry or apple), 4 glucose tablets, OR glucose gel. Recheck blood sugar in 15 minutes after treatment (to make sure it is greater than 70 mg/dL). If your blood sugar is not greater than 70 mg/dL on recheck, call [phone] for further instructions. Report your blood sugar to the short stay nurse when you get to Short Stay.  If you are admitted to the hospital after surgery: Your blood sugar will be checked by the staff and you will probably be given insulin after surgery (instead of oral diabetes medicines) to make sure you have good blood sugar levels. The goal for blood sugar control after surgery is 80-180 mg/dL.    After your COVID test   You are not required to quarantine however you are required to wear a well-fitting mask when you  are out and around people not in your household.  If your mask becomes wet or soiled, replace with a new one.  Wash your hands often with soap and water for 20 seconds or clean your hands with an alcohol-based hand sanitizer that contains at least 60% alcohol.  Do not share personal items.  Notify your provider: if you are in close contact with someone who has COVID  or if you develop a fever of 100.4 or greater, sneezing, cough, sore throat, shortness of breath or body aches.           Do not wear jewelry or makeup Do not wear lotions, powders, perfumes/colognes, or  deodorant. Do not shave 48 hours prior to surgery.  Men may shave face and neck. Do not bring valuables to the hospital. Do not wear nail polish, gel polish, artificial nails, or any other type of covering on natural nails (fingers and toes) If you have artificial nails or gel coating that need to be removed by a nail salon, please have this removed prior to surgery. Artificial nails or gel coating may interfere with anesthesia's ability to adequately monitor your vital signs.             [facility] is not responsible for any belongings or valuables.  Do NOT Smoke (Tobacco/Vaping)  24 hours prior to your procedure  If you use a CPAP at night, you may bring your mask for your overnight stay.   Contacts, glasses, hearing aids, dentures or partials may not be worn into surgery, please bring cases for these belongings   For patients admitted to the hospital, discharge time will be determined by your treatment team.   Patients discharged the day of surgery will not be allowed to drive home, and someone needs to stay with them for 24 hours.  NO VISITORS WILL BE ALLOWED IN PRE-OP WHERE PATIENTS ARE PREPPED FOR SURGERY.  ONLY 1 SUPPORT PERSON MAY BE PRESENT IN THE WAITING ROOM WHILE YOU ARE IN SURGERY.  IF YOU ARE TO BE ADMITTED, ONCE YOU ARE IN YOUR ROOM YOU WILL BE ALLOWED TWO (2) VISITORS. 1 (ONE) VISITOR MAY STAY OVERNIGHT BUT MUST ARRIVE TO THE ROOM BY 8pm.  Minor children may have two parents present. Special consideration for safety and communication needs will be reviewed on a case by case basis.  Special instructions:    Oral Hygiene is also important to reduce your risk of infection.  Remember - BRUSH YOUR TEETH THE MORNING OF SURGERY WITH YOUR REGULAR TOOTHPASTE   [place]- Preparing For Surgery  Before surgery, you can play an important role. Because skin is not sterile, your skin needs to be as free of germs as possible. You can reduce the number of germs on your skin by washing  with CHG (chlorahexidine gluconate) Soap before surgery.  CHG is an antiseptic cleaner which kills germs and bonds with the skin to continue killing germs even after washing.     Please do not use if you have an allergy to CHG or antibacterial soaps. If your skin becomes reddened/irritated stop using the CHG.  Do not shave (including legs and underarms) for at least 48 hours prior to first CHG shower. It is OK to shave your face.  Please follow these instructions carefully.     Shower the NIGHT BEFORE SURGERY and the MORNING OF SURGERY with CHG Soap.   If you chose to wash your hair, wash your hair first as usual with your normal shampoo.  After you shampoo, rinse your hair and body thoroughly to remove the shampoo.  Then [employer] and genitals (private parts) with your normal soap and rinse thoroughly to remove soap.  After that Use CHG Soap as you would any other liquid soap. You can apply CHG directly to the skin and wash gently with a scrungie or a clean washcloth.   Apply the CHG Soap to your body ONLY FROM THE NECK DOWN.  Do not use on open wounds or open sores. Avoid contact with your eyes, ears, mouth and genitals (private parts). Wash Face and genitals (private parts)  with your normal soap.   Wash thoroughly, paying special attention to the area where your surgery will be performed.  Thoroughly rinse your body with warm water from the neck down.  DO NOT shower/wash with your normal soap after using and rinsing off the CHG Soap.  Pat yourself dry with a CLEAN TOWEL.  Wear CLEAN PAJAMAS to bed the night before surgery  Place CLEAN SHEETS on your bed the night before your surgery  DO NOT SLEEP WITH PETS.   Day of Surgery:  Take a shower with CHG soap. Wear Clean/Comfortable clothing the morning of surgery Do not apply any deodorants/lotions.   Remember to brush your teeth WITH YOUR REGULAR TOOTHPASTE.   Please read over the following fact sheets that you were given.

## 2021-03-12 NOTE — Plan of Care (Signed)
Neurology plan of care:  cEEG applied early am and after about 6 hours, there is evidence of slowing, but no seizures or epileptiform discharges. Continue LTM and we will follow final read. No stroke on imaging to account for his aphasia events.   Clance Boll, MSN, APN-BC Neurology Nurse Practitioner Pager [phone]

## 2021-03-12 NOTE — Progress Notes (Signed)
FPTS Brief Progress Note  S: Patient laying comfortably in bed asleep, did not awake   O: BP (!) 161/97 (BP Location: Left Arm)    Pulse 87    Temp 98.2 F (36.8 C) (Oral)    Resp 16    SpO2 97%   Gen: NAD, asleep in bed Resp: Breathing comfortably on room air, no increased work of breathing  A/P: Expressive aphasia   Possible TIA -EEG wnl so far -f/u neuro function  Hyperglycemia   T2DM -monitor CBGs - Orders reviewed. Labs for AM ordered, which was adjusted as needed.   Gerrit Heck, MD 03/12/2021, 11:20 PM PGY-1, [place] [facility] Night Resident  Please page [phone] with questions.

## 2021-03-12 NOTE — Progress Notes (Signed)
Inpatient Diabetes Program Recommendations  AACE/ADA: New Consensus Statement on Inpatient Glycemic Control (2015)  Target Ranges:  Prepandial:   less than 140 mg/dL      Peak postprandial:   less than 180 mg/dL (1-2 hours)      Critically ill patients:  140 - 180 mg/dL   Lab Results  Component Value Date   GLUCAP 310 (H) 03/12/2021   HGBA1C >15 02/19/2021    Review of Glycemic Control  Latest Reference Range & Units 03/11/21 21:20 03/11/21 23:10 03/12/21 02:59 03/12/21 04:07 03/12/21 08:26  Glucose-Capillary 70 - 99 mg/dL 546 (HH) 425 (H) 342 (H) 347 (H) 310 (H)  (HH): Data is critically high (H): Data is abnormally high  Diabetes history: DM2 Outpatient Diabetes medications: Semglee 18 units bid Current orders for Inpatient glycemic control: None   Inpatient Diabetes Program Recommendations:   Patient currently in ED.  Please consider -Semglee 18 units bid -Novolog correction 0-15 units tid + hs 0-5 units Secure chat sent to resident Dr. Rico Sheehan.  DM coordinator spoke with patient regarding elevated A1c of 10.5 during last admission 12/25/20. Will follow during hospitalization.  Thank you, Nani Gasser. Shaunie Boehm, RN, MSN, CDE  Diabetes Coordinator Inpatient Glycemic Control Team Team Pager [phone] (8am-5pm) 03/12/2021 10:00 AM

## 2021-03-13 ENCOUNTER — Inpatient Hospital Stay (HOSPITAL_COMMUNITY)
Admission: RE | Admit: 2021-03-13 | Discharge: 2021-03-13 | Disposition: A | Discharge status: Home / Self Care | Payer: BC Managed Care – PPO | Source: Ambulatory Visit

## 2021-03-13 ENCOUNTER — Other Ambulatory Visit: Payer: Self-pay | Admitting: *Deleted

## 2021-03-13 DIAGNOSIS — E1149 Type 2 diabetes mellitus with other diabetic neurological complication: Secondary | ICD-10-CM | POA: Diagnosis not present

## 2021-03-13 DIAGNOSIS — Z794 Long term (current) use of insulin: Secondary | ICD-10-CM

## 2021-03-13 DIAGNOSIS — R4701 Aphasia: Secondary | ICD-10-CM

## 2021-03-13 DIAGNOSIS — R4702 Dysphasia: Secondary | ICD-10-CM | POA: Diagnosis not present

## 2021-03-13 DIAGNOSIS — R29818 Other symptoms and signs involving the nervous system: Secondary | ICD-10-CM

## 2021-03-13 LAB — BASIC METABOLIC PANEL
Anion gap: 6 (ref 5–15)
BUN: 6 mg/dL (ref 6–20)
CO2: 28 mmol/L (ref 22–32)
Calcium: 8.8 mg/dL — ABNORMAL LOW (ref 8.9–10.3)
Chloride: 104 mmol/L (ref 98–111)
Creatinine, Ser: 1.36 mg/dL — ABNORMAL HIGH (ref 0.61–1.24)
GFR, Estimated: 60 mL/min — ABNORMAL LOW (ref >60)
Glucose, Bld: 233 mg/dL — ABNORMAL HIGH (ref 70–99)
Potassium: 3.6 mmol/L (ref 3.5–5.1)
Sodium: 138 mmol/L (ref 135–145)

## 2021-03-13 LAB — GLUCOSE, CAPILLARY
Glucose-Capillary: 231 mg/dL — ABNORMAL HIGH (ref 70–99)
Glucose-Capillary: 245 mg/dL — ABNORMAL HIGH (ref 70–99)
Glucose-Capillary: 301 mg/dL — ABNORMAL HIGH (ref 70–99)
Glucose-Capillary: 355 mg/dL — ABNORMAL HIGH (ref 70–99)
Glucose-Capillary: 370 mg/dL — ABNORMAL HIGH (ref 70–99)
Glucose-Capillary: 391 mg/dL — ABNORMAL HIGH (ref 70–99)
Glucose-Capillary: 397 mg/dL — ABNORMAL HIGH (ref 70–99)

## 2021-03-13 MED ORDER — INSULIN STARTER KIT- PEN NEEDLES (ENGLISH)
1.0000 | Freq: Once | Status: DC
  Filled 2021-03-13 (×2): qty 1

## 2021-03-13 MED ORDER — AMLODIPINE BESYLATE 10 MG PO TABS: 10.0000 mg | ORAL_TABLET | Freq: Every day | ORAL | Status: DC

## 2021-03-13 MED ORDER — LIVING WELL WITH DIABETES BOOK
Freq: Once | Status: AC
  Filled 2021-03-13: qty 1

## 2021-03-13 MED ORDER — AMLODIPINE BESYLATE 10 MG PO TABS
10.0000 mg | ORAL_TABLET | Freq: Every day | ORAL | Status: DC
  Administered 2021-03-13 – 2021-03-14 (×2): 10 mg via ORAL
  Filled 2021-03-13 (×2): qty 1

## 2021-03-13 MED ORDER — POTASSIUM CHLORIDE 20 MEQ PO PACK
40.0000 meq | PACK | Freq: Once | ORAL | Status: AC
  Administered 2021-03-13: 40 meq via ORAL
  Filled 2021-03-13: qty 2

## 2021-03-13 MED ORDER — ISOSORBIDE MONONITRATE ER 60 MG PO TB24
90.0000 mg | ORAL_TABLET | Freq: Every day | ORAL | Status: DC
  Administered 2021-03-13 – 2021-03-14 (×2): 90 mg via ORAL
  Filled 2021-03-13 (×2): qty 1

## 2021-03-13 MED ORDER — GLUCERNA SHAKE PO LIQD
237.0000 mL | Freq: Two times a day (BID) | ORAL | Status: DC
  Administered 2021-03-14: 237 mL via ORAL
  Filled 2021-03-13: qty 237

## 2021-03-13 MED ORDER — INSULIN GLARGINE-YFGN 100 UNIT/ML ~~LOC~~ SOLN
18.0000 [IU] | Freq: Two times a day (BID) | SUBCUTANEOUS | Status: DC
  Administered 2021-03-13: 18 [IU] via SUBCUTANEOUS
  Filled 2021-03-13 (×2): qty 0.18

## 2021-03-13 MED ORDER — INSULIN GLARGINE-YFGN 100 UNIT/ML ~~LOC~~ SOLN
25.0000 [IU] | Freq: Two times a day (BID) | SUBCUTANEOUS | Status: DC
  Administered 2021-03-13 – 2021-03-14 (×2): 25 [IU] via SUBCUTANEOUS
  Filled 2021-03-13 (×3): qty 0.25

## 2021-03-13 MED ORDER — CARVEDILOL 6.25 MG PO TABS: 18.7500 mg | ORAL_TABLET | Freq: Two times a day (BID) | ORAL | Status: DC

## 2021-03-13 NOTE — Progress Notes (Signed)
PT Cancellation Note  Patient Details Name: [name] MRN: [id number] DOB: 1961/01/29   Cancelled Treatment:    Reason Eval/Treat Not Completed: Patient declined, no reason specified.  Pt states he is at his baseline function and does not need PT. 03/13/2021  Ginger Carne., PT Acute Rehabilitation Services [phone]  (pager) [phone]  (office)   [name] 03/13/2021, 1:38 PM

## 2021-03-13 NOTE — Progress Notes (Signed)
LTM maint complete - no skin breakdown under: A2 ref ground  F3 F4  O1 O2 CzP4 A2 replaced [facility] monitored, Event button test confirmed by [facility].

## 2021-03-13 NOTE — Progress Notes (Incomplete)
FPTS Brief Progress Note  S:***   O: BP 133/83 (BP Location: Right Arm)    Pulse (!) 105    Temp 98.2 F (36.8 C) (Oral)    Resp 18    Wt 96.4 kg    SpO2 98%    BMI 33.30 kg/m     A/P: Expressive Aphasia, resolved   Suspected TIA -home amlodipine restarted today -monitor Bps -F/u neurology recs  T2DM -Will get 25 units semglee tonight -monitor CBGs  - Orders reviewed. Labs for AM ordered, which was adjusted as needed.   Gerrit Heck, MD 03/13/2021, 8:25 PM PGY-1, [facility] Night Resident  Please page [phone] with questions.

## 2021-03-13 NOTE — Progress Notes (Signed)
OT Cancellation Note  Patient Details Name: [name] MRN: [id number] DOB: 05-03-1960   Cancelled Treatment:    Reason Eval/Treat Not Completed: Patient declined, no reason specified (Upon arrival, pt on the phone with family and asking OT to come back. OT evaluation to f/u later today.)  Arhaan Chesnut A Elford Evilsizer 03/13/2021, 10:07 AM

## 2021-03-13 NOTE — Progress Notes (Signed)
Initial Nutrition Assessment  DOCUMENTATION CODES:   Obesity unspecified  INTERVENTION:  - Encourage PO intake - Discontinue Ensure - Glucerna Shake po BID, each supplement provides 220 kcal and 10 grams of protein - Provided "Plate Method" to AVS per conversation with pt  NUTRITION DIAGNOSIS:   Increased nutrient needs related to acute illness as evidenced by estimated needs.  GOAL:   Patient will meet greater than or equal to 90% of their needs  MONITOR:   PO intake, Supplement acceptance, Labs, Weight trends  REASON FOR ASSESSMENT:   Malnutrition Screening Tool    ASSESSMENT:   Pt admitted with dysphagia secondary to possible TIA. PMH includes CVA 12/2020, HTN, T2DM, CAD s/p PCI, CKD, history of NSTEMI tobacco use, EtOH use history  SLP following pt and reports no overt signs of dysphagia or aspiration- recommend regular diet  Pt reports eating well although he states that he has had a decreased in appetite for 3-4 weeks and has been eating about half of his usual intake. He typically eats 2-3 times a day and has been eating out more recently. He is a "foodie" and eats whatever he feels like at that moment" such as [place] food or burgers.   At home, he has been taking 18 units of insulin BID and does not check his blood sugar. He reports that he does not like to prick his finger and was supposed to be provided a continuous glucose monitor for blood sugar checks. We discussed importance of balanced nutrition and choosing foods that he loves but finding ways to add more vegetables as he does not usually enjoy many vegetables. Pt states "I know what to do, I just don't do it." We briefly discussed the "Plate Method" as a way to balance blood sugars.  Limited weight history, however noted weights trending down since November 2022. Noted 4% weight loss from November 2022 until this admission. Pt reports that he previously tried to lose weight but got bored of eating the same  foods and exercising and stopped. He states his usual weight has remained around 210 lbs and denies recent weight loss.  Medications: SSI, semglee 18 units BID  Labs: Cr 1.36, ionized Ca 1.06, AST 13   HgbA1c  >15%, CBG's 231-372 x24 hours  NUTRITION - FOCUSED PHYSICAL EXAM:  Flowsheet Row Most Recent Value  Orbital Region No depletion  Upper Arm Region No depletion  Thoracic and Lumbar Region No depletion  Buccal Region No depletion  Temple Region No depletion  Clavicle Bone Region No depletion  Clavicle and Acromion Bone Region No depletion  Scapular Bone Region No depletion  Dorsal Hand No depletion  Patellar Region No depletion  Anterior Thigh Region No depletion  Posterior Calf Region No depletion  Edema (RD Assessment) None  Hair Reviewed  Eyes Reviewed  Mouth Reviewed  Skin Reviewed  Nails Reviewed       Diet Order:   Diet Order             Diet Carb Modified Fluid consistency: Thin; Room service appropriate? Yes  Diet effective now                   EDUCATION NEEDS:   Education needs have been addressed  Skin:  Skin Assessment: Reviewed RN Assessment  Last BM:  2/1  Height:   Ht Readings from Last 1 Encounters:  02/19/21 5\' 7"  (1.702 m)    Weight:   Wt Readings from Last 1 Encounters:  03/13/21 96.4  kg   BMI:  Body mass index is 33.3 kg/m.  Estimated Nutritional Needs:   Kcal:  1800-2000  Protein:  90-105g  Fluid:  >/=1.8L  Clayborne Dana, RDN, LDN Clinical Nutrition

## 2021-03-13 NOTE — Progress Notes (Signed)
LTM D/C'd patient per Dr. Hortense Ramal. No skin breakdown noted. Patient was in chair and talking when I left the room.

## 2021-03-13 NOTE — Discharge Instructions (Addendum)
Plate Method for Diabetes   Foods with carbohydrates make your blood glucose level go up. The plate method is a simple way to meal plan and control the amount of carbohydrate you eat.         Use the following guidance to build a healthy plate to control carbohydrates. Divide a 9-inch plate into 3 sections, and consider your beverage the 4th section of your meal: Food Group Examples of Foods/Beverages for This Section of your Meal  Section 1: Non-starchy vegetables Fill  of your plate to include non-starchy vegetables Asparagus, broccoli, brussels sprouts, cabbage, carrots, cauliflower, celery, cucumber, green beans, mushrooms, peppers, salad greens, tomatoes, or zucchini.  Section 2: Protein foods Fill  of your plate to include a lean protein Lean meat, poultry, fish, seafood, cheese, eggs, lean deli meat, tofu, beans, lentils, nuts or nut butters.  Section 3: Carbohydrate foods Fill  of your plate to include carbohydrate foods Whole grains, whole wheat bread, brown rice, whole grain pasta, polenta, corn tortillas, fruit, or starchy vegetables (potatoes, green peas, corn, beans, acorn squash, and butternut squash). One cup of milk also counts as a food that contains carbohydrate.  Section 4: Beverage Choose water or a low-calorie drink for your beverage. Unsweetened tea, coffee, or flavored/sparkling water without added sugar.  Image reprinted with permission from The American Diabetes Association.  Copyright 2022 by the American Diabetes Association.   Copyright 2022  Academy of Nutrition and Dietetics. All rights reserved   Dear [name],  Thank you for letting us participate in your care. You were hospitalized for difficulty with your speech and diagnosed with a transient ischemic attack (TIA) sometimes called a "mini-stroke."  You were treated with therapy (physical therapy and speech therapy) and medical optimization of your blood pressure, diabetes, and high cholesterol.    POST-HOSPITAL & CARE INSTRUCTIONS The key to preventing future events like this will be getting better control over your diabetes. We are increasing your insulin dose and you can further adjust this on an outpatient basis with the assistance of your primary care provider.  Dr. Vivi Martens office will follow-up with you regarding scheduling your CABG at an appropriate time.  In them meantime, you should continue to take your Plavix and Aspirin to help minimize the risk of future cardiac or neurological event. At some point in the future, you will need to have a procedure called a "fine needle aspiration" of one of the nodules on your thyroid. Your primary doctor, Dr. Redmond Pulling, will help you to get this set up.  Go to your follow up appointments (listed below)   DOCTOR'S APPOINTMENT   Future Appointments  Date Time Provider [place]  03/25/2021  4:00 PM [name], MD [facility]  03/27/2021  9:00 AM Freada Bergeron, MD [id number]    Follow-up Information     [name], MD Follow up on 03/25/2021.   Specialty: Cardiothoracic Surgery Why: Appointment is at 4:00 Contact information: [address] [place] [address] [phone]                 Take care and be well!  [facility]  [place], [place] [address] [phone]

## 2021-03-13 NOTE — Evaluation (Signed)
Occupational Therapy Evaluation Patient Details Name: [name] MRN: [id number] DOB: 01-03-1961 Today's Date: 03/13/2021   History of Present Illness Pt is a 61 y/o male presenting 1/30 with aphasia. CT negative, MRI negative. EEG unremarkable. PMH includes: CVA 11/22, HTN, DM2, CAD s/p PCI, NSTEMi, ETOH use history, AKI.   Clinical Impression   [name] has an indep baseline, no AD but reports difficulty with sleeping since November. He lives in a multi-level home with his Uncle, bed and bath are upstairs. Upon evaluation pt demonstrated indep ADLs and functional mobility without AD, minimal verbal cues given for safety and management of EEG. Distance limited due to continuous EEG however pt demonstrated baseline transfers and functional mobility within the room. He does not have further OT needs acutely, or at d/c. OT to sign off, thank you.      Recommendations for follow up therapy are one component of a multi-disciplinary discharge planning process, led by the attending physician.  Recommendations may be updated based on patient status, additional functional criteria and insurance authorization.   Follow Up Recommendations  No OT follow up    Assistance Recommended at Discharge PRN     Functional Status Assessment  Patient has had a recent decline in their functional status and demonstrates the ability to make significant improvements in function in a reasonable and predictable amount of time.  Equipment Recommendations  None recommended by OT    Recommendations for Other Services       Precautions / Restrictions Precautions Precautions: Fall Precaution Comments: EEG Restrictions Weight Bearing Restrictions: No      Mobility Bed Mobility Overal bed mobility: Modified Independent                  Transfers Overall transfer level: Modified independent                 General transfer comment: mildly impulsive, required cues to wait for therapist to manage EEG       Balance Overall balance assessment: Needs assistance Sitting-balance support: Feet supported Sitting balance-Leahy Scale: Normal     Standing balance support: No upper extremity supported Standing balance-Leahy Scale: Good         ADL either performed or assessed with clinical judgement   ADL Overall ADL's : Modified independent;At baseline           General ADL Comments: Pt is mod I for all ADLs, he required minimal cues for safety but likely his baseline.     Vision Baseline Vision/History: 0 No visual deficits Ability to See in Adequate Light: 0 Adequate Patient Visual Report: No change from baseline Vision Assessment?: No apparent visual deficits Additional Comments: pt reports some progressive blurriness that has been worsening since last year            Pertinent Vitals/Pain Pain Assessment Pain Assessment: No/denies pain     Hand Dominance     Extremity/Trunk Assessment Upper Extremity Assessment Upper Extremity Assessment: Overall WFL for tasks assessed   Lower Extremity Assessment Lower Extremity Assessment: Overall WFL for tasks assessed   Cervical / Trunk Assessment Cervical / Trunk Assessment: Normal   Communication Communication Communication: Expressive difficulties (mild word finding difficutlies. Pt states he is "close" to baseline talking)   Cognition Arousal/Alertness: Awake/alert Behavior During Therapy: WFL for tasks assessed/performed Overall Cognitive Status: Impaired/Different from baseline Area of Impairment: Safety/judgement, Awareness  Safety/Judgement: Decreased awareness of deficits Awareness: Intellectual   General Comments: pt a bit agitated this session due to lack of sleep.     General Comments  VSS on RA, 2 EEG leads off upon arrival, RN aware     [place] expects to be discharged to:: Private residence Living Arrangements: Other relatives Available Help at  Discharge: Family;Available 24 hours/day Type of Home: House Home Access: Stairs to enter [employer] of Steps: 14 Entrance Stairs-Rails: Left;Right;Can reach both Home Layout: Two level;Bed/bath upstairs     Bathroom Shower/Tub: Teacher, early years/pre: Handicapped height     Home Equipment: None      Lives With: Family    Prior Functioning/Environment Prior Level of Function : Independent/Modified Independent             Mobility Comments: no AD ADLs Comments: indep, states that he has been having trouble sleeping since November        OT Problem List: Decreased cognition;Decreased safety awareness;Decreased knowledge of precautions         OT Goals(Current goals can be found in the care plan section) Acute Rehab OT Goals Patient Stated Goal: home OT Goal Formulation: All assessment and education complete, DC therapy   AM-PAC OT "6 Clicks" Daily Activity     Outcome Measure Help from another person eating meals?: None Help from another person taking care of personal grooming?: None Help from another person toileting, which includes using toliet, bedpan, or urinal?: None Help from another person bathing (including washing, rinsing, drying)?: None Help from another person to put on and taking off regular upper body clothing?: None Help from another person to put on and taking off regular lower body clothing?: None 6 Click Score: 24   End of Session Nurse Communication: Mobility status  Activity Tolerance: Patient tolerated treatment well Patient left: in chair;with call bell/phone within reach  OT Visit Diagnosis: Other abnormalities of gait and mobility (R26.89)                Time: 0240-9735 OT Time Calculation (min): 20 min Charges:  OT General Charges $OT Visit: 1 Visit OT Evaluation $OT Eval Low Complexity: 1 Low   Daizha Anand A Cem Kosman 03/13/2021, 12:21 PM

## 2021-03-13 NOTE — Progress Notes (Signed)
Family Medicine Teaching Service Daily Progress Note Intern Pager: [phone]  Patient name: [name] Medical record number: [id number] Date of birth: [birth year] Age: 60 y.o. Gender: male  Primary Care Provider: [name], MD Consultants: Neuro Code Status: Full  Pt Overview and Major Events to Date:  1/30- admitted  Assessment and Plan: [name] is a 61 year old male who presented with aphasia.  History significant for CVA 2022, HTN, T2DM, CAD s/p PCI, CKD, Hx of NSTEMI, tobacco use, alcohol use.  Expressive Aphasia, resolved   Suspected TIA Speech at baseline. Imaging unremarkable. Bps 150s-60s/90s overnight, but currently in permissive hypertensive period. EEG has been WNL. Suspect that all of his symptoms are 2/2 TIA. SLP signed off for dysphagia but recommend outpatient speech therapy for expressive speech.  - Restart home amlodipine - Atorvastatin 80mg  - Follow-up neurology recommendations  T2DM Glucoses overnight 231>355. In 24 hours received 12u glargine and 54u SAI. Home regimen is 18 u Semglee BID.  - Restart home Semglee 18u this am, if CBGs remain elevated this afternoon, will increase evening dose - CBGs/SSI  HTN Bps 150s-60s/90s. Had been holding home amlodipine and Lasix for permissive htn in setting of suspected TIA. - Restart home amlodipine  CAD s/p PCI 2022  Was scheduled for CABG on Friday, called [facility] to reschedule this. Hopeful to have it rescheduled later this month. They will come to see him this afternoon.  - Holding plavix in setting of upcoming surgery  HFpEF, stable Euvolemic today. I/O flat. Uses Lasix 20mg  daily at home.  - Restart Imdur and Lasix - Strict I/O, daily weight   Enlarged thyroid noted on head CT TSH wnl at 0.460. Thyromegaly with bilateral nodules noted on [place], FNA recommended. Unlikely to be related to his symptoms, but will need outpt follow-up.  - Outpt follow-up   FEN/GI: Carb-modified PPx: Lovenox Dispo:Home tomorrow.  Barriers include further workup.   Subjective:  [name] feels "much, much better" today. 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." He has no acute complaints at this time  Objective: Temp:  [98 F (36.7 C)-98.8 F (37.1 C)] 98.8 F (37.1 C) (02/01 0738) Pulse Rate:  [83-99] 99 (02/01 1125) Resp:  [16-27] 20 (02/01 1125) BP: (146-173)/(80-107) 164/100 (02/01 1125) SpO2:  [93 %-99 %] 98 % (02/01 1125) Weight:  [96.4 kg] 96.4 kg (02/01 0456) Physical Exam: General: Alert, NAD Neck: Thyroid enlarged but non-tender, exam limited by neck size Cardiovascular: RRR, no m/r/g  Respiratory: Normal WOB on RA, lungs clear throughout Abdomen: Soft, non-tender, non-distended Extremities: Without edema or deformity   Laboratory: Recent Labs  Lab 03/11/21 2007 03/11/21 2016 03/12/21 0311  WBC 7.7  --  8.8  HGB 16.0 16.7 14.5  HCT 45.0 49.0 41.1  PLT 225  --  205   Recent Labs  Lab 03/11/21 2007 03/11/21 2016 03/12/21 0500 03/12/21 1455 03/13/21 0410  NA 129*   < > 135 136 138  K 3.1*   < > 3.1* 3.0* 3.6  CL 89*   < > 97* 99 104  CO2 27  --  26 27 28   BUN 10   < > 8 6 6   CREATININE 1.46*   < > 1.29* 1.32* 1.36*  CALCIUM 9.3  --  8.4* 8.5* 8.8*  PROT 7.4  --  6.2*  --   --   BILITOT 1.1  --  0.9  --   --   ALKPHOS 177*  --  129*  --   --   ALT 14  --  10  --   --   AST 15  --  13*  --   --   GLUCOSE 545*   < > 322* 257* 233*   < > = values in this interval not displayed.    Imaging/Diagnostic Tests: Overnight EEG with video Lora Havens, MD     03/13/2021  9:24 AM Patient Name: [name]  MRN: [id number]  Epilepsy Attending: Lora Havens  Referring Physician/Provider: Donnetta Simpers, MD Duration: 03/12/2021 0076 to  03/13/2021 2263  Patient history:  61 y.o. male presenting with acute onset  expressive worse than sensory aphasia. EEG to evaluate for   seizure  Level of alertness: Awake, asleep  AEDs during EEG study: None  Technical aspects: This EEG study was done with scalp electrodes  positioned according to the 10-20 International system of  electrode placement. Electrical activity was acquired at a  sampling rate of 500Hz  and reviewed with a high frequency filter  of 70Hz  and a low frequency filter of 1Hz . EEG data were recorded  continuously and digitally stored.   Description: The posterior dominant rhythm consists of 9 Hz  activity of moderate voltage (25-35 uV) seen predominantly in  posterior head regions, symmetric and reactive to eye opening and  eye closing. Sleep was characterized by vertex waves, sleep  spindles (12 to 14 Hz), maximal frontocentral region. Sleep was  characterized by vertex waves, sleep spindles (12 to 14 Hz),  maximum frontocentral region. Hyperventilation and photic  stimulation were not performed.     Of note, parts of study were difficult interpret due to  significant electrode artifact.  IMPRESSION: This study is within normal limits. No seizures or epileptiform  discharges were seen throughout the recording.  [place], MD 03/13/2021, 11:55 AM PGY-1, [place] Intern pager: [phone], text pages welcome

## 2021-03-13 NOTE — Procedures (Addendum)
Patient Name: [name]  MRN: [id number]  Epilepsy Attending: Lora Havens  Referring Physician/Provider: Donnetta Simpers, MD Duration: 03/13/2021 0513 to  03/13/2021 1044   Patient history:  61 y.o. male presenting with acute onset expressive worse than sensory aphasia. EEG to evaluate for seizure   Level of alertness: Awake, asleep   AEDs during EEG study: None   Technical aspects: This EEG study was done with scalp electrodes positioned according to the 10-20 International system of electrode placement. Electrical activity was acquired at a sampling rate of 500Hz  and reviewed with a high frequency filter of 70Hz  and a low frequency filter of 1Hz . EEG data were recorded continuously and digitally stored.    Description: The posterior dominant rhythm consists of 9 Hz activity of moderate voltage (25-35 uV) seen predominantly in posterior head regions, symmetric and reactive to eye opening and eye closing. Sleep was characterized by vertex waves, sleep spindles (12 to 14 Hz), maximal frontocentral region. Sleep was characterized by vertex waves, sleep spindles (12 to 14 Hz), maximum frontocentral region. Hyperventilation and photic stimulation were not performed.     EKG artifact was seen throughout the study.   IMPRESSION: This study is within normal limits. No seizures or epileptiform discharges were seen throughout the recording.  Roseann Kees Barbra Sarks

## 2021-03-14 DIAGNOSIS — Z794 Long term (current) use of insulin: Secondary | ICD-10-CM | POA: Diagnosis not present

## 2021-03-14 DIAGNOSIS — E1149 Type 2 diabetes mellitus with other diabetic neurological complication: Secondary | ICD-10-CM | POA: Diagnosis not present

## 2021-03-14 DIAGNOSIS — R4701 Aphasia: Secondary | ICD-10-CM | POA: Diagnosis not present

## 2021-03-14 DIAGNOSIS — R4702 Dysphasia: Secondary | ICD-10-CM | POA: Diagnosis not present

## 2021-03-14 LAB — GLUCOSE, CAPILLARY
Glucose-Capillary: 243 mg/dL — ABNORMAL HIGH (ref 70–99)
Glucose-Capillary: 315 mg/dL — ABNORMAL HIGH (ref 70–99)
Glucose-Capillary: 370 mg/dL — ABNORMAL HIGH (ref 70–99)
Glucose-Capillary: 305 mg/dL — ABNORMAL HIGH (ref 70–99)

## 2021-03-14 LAB — BASIC METABOLIC PANEL
Anion gap: 7 (ref 5–15)
BUN: 13 mg/dL (ref 6–20)
CO2: 26 mmol/L (ref 22–32)
Calcium: 9 mg/dL (ref 8.9–10.3)
Chloride: 102 mmol/L (ref 98–111)
Creatinine, Ser: 1.49 mg/dL — ABNORMAL HIGH (ref 0.61–1.24)
GFR, Estimated: 53 mL/min — ABNORMAL LOW (ref >60)
Glucose, Bld: 240 mg/dL — ABNORMAL HIGH (ref 70–99)
Potassium: 3.7 mmol/L (ref 3.5–5.1)
Sodium: 135 mmol/L (ref 135–145)

## 2021-03-14 MED ORDER — INSULIN GLARGINE-YFGN 100 UNIT/ML ~~LOC~~ SOLN: 25.0000 [IU] | Freq: Two times a day (BID) | SUBCUTANEOUS | 11 refills | Status: DC

## 2021-03-14 MED ORDER — POTASSIUM CHLORIDE 20 MEQ PO PACK
40.0000 meq | PACK | Freq: Once | ORAL | Status: AC
  Administered 2021-03-14: 40 meq via ORAL
  Filled 2021-03-14: qty 2

## 2021-03-14 NOTE — Plan of Care (Signed)
°  Problem: Education: Goal: Knowledge of General Education information will improve Description: Including pain rating scale, medication(s)/side effects and non-pharmacologic comfort measures 03/14/2021 1623 by Delia Chimes, RN Outcome: Adequate for Discharge 03/14/2021 1111 by Delia Chimes, RN Outcome: Progressing   Problem: Health Behavior/Discharge Planning: Goal: Ability to manage health-related needs will improve 03/14/2021 1623 by Delia Chimes, RN Outcome: Adequate for Discharge 03/14/2021 1111 by Delia Chimes, RN Outcome: Progressing   Problem: Clinical Measurements: Goal: Ability to maintain clinical measurements within normal limits will improve 03/14/2021 1623 by Delia Chimes, RN Outcome: Adequate for Discharge 03/14/2021 1111 by Delia Chimes, RN Outcome: Progressing Goal: Will remain free from infection 03/14/2021 1623 by Delia Chimes, RN Outcome: Adequate for Discharge 03/14/2021 1111 by Delia Chimes, RN Outcome: Progressing Goal: Diagnostic test results will improve 03/14/2021 1623 by Delia Chimes, RN Outcome: Adequate for Discharge 03/14/2021 1111 by Delia Chimes, RN Outcome: Progressing Goal: Respiratory complications will improve 03/14/2021 1623 by Delia Chimes, RN Outcome: Adequate for Discharge 03/14/2021 1111 by Delia Chimes, RN Outcome: Progressing Goal: Cardiovascular complication will be avoided 03/14/2021 1623 by Delia Chimes, RN Outcome: Adequate for Discharge 03/14/2021 1111 by Delia Chimes, RN Outcome: Progressing   Problem: Activity: Goal: Risk for activity intolerance will decrease 03/14/2021 1623 by Delia Chimes, RN Outcome: Adequate for Discharge 03/14/2021 1111 by Delia Chimes, RN Outcome: Progressing   Problem: Nutrition: Goal: Adequate nutrition will be maintained 03/14/2021 1623 by Delia Chimes, RN Outcome: Adequate for Discharge 03/14/2021 1111 by Delia Chimes, RN Outcome: Progressing    Problem: Coping: Goal: Level of anxiety will decrease 03/14/2021 1623 by Delia Chimes, RN Outcome: Adequate for Discharge 03/14/2021 1111 by Delia Chimes, RN Outcome: Progressing   Problem: Elimination: Goal: Will not experience complications related to bowel motility 03/14/2021 1623 by Delia Chimes, RN Outcome: Adequate for Discharge 03/14/2021 1111 by Delia Chimes, RN Outcome: Progressing Goal: Will not experience complications related to urinary retention 03/14/2021 1623 by Delia Chimes, RN Outcome: Adequate for Discharge 03/14/2021 1111 by Delia Chimes, RN Outcome: Progressing   Problem: Pain Managment: Goal: General experience of comfort will improve 03/14/2021 1623 by Delia Chimes, RN Outcome: Adequate for Discharge 03/14/2021 1111 by Delia Chimes, RN Outcome: Progressing   Problem: Safety: Goal: Ability to remain free from injury will improve 03/14/2021 1623 by Delia Chimes, RN Outcome: Adequate for Discharge 03/14/2021 1111 by Delia Chimes, RN Outcome: Progressing   Problem: Skin Integrity: Goal: Risk for impaired skin integrity will decrease 03/14/2021 1623 by Delia Chimes, RN Outcome: Adequate for Discharge 03/14/2021 1111 by Delia Chimes, RN Outcome: Progressing   Problem: Ischemic Stroke/TIA Tissue Perfusion: Goal: Complications of ischemic stroke/TIA will be minimized Outcome: Adequate for Discharge

## 2021-03-14 NOTE — Progress Notes (Signed)
SLP Cancellation Note  Patient Details Name: [name] MRN: [id number] DOB: September 15, 1960   Cancelled treatment:       Reason Eval/Treat Not Completed: Patient unavailable, will f/u as able.     Osie Bond., M.A. [place] [facility] Pager [phone] Office [phone]  03/14/2021, 2:33 PM

## 2021-03-14 NOTE — Progress Notes (Addendum)
Inpatient Diabetes Program Recommendations  AACE/ADA: New Consensus Statement on Inpatient Glycemic Control (2015)  Target Ranges:  Prepandial:   less than 140 mg/dL      Peak postprandial:   less than 180 mg/dL (1-2 hours)      Critically ill patients:  140 - 180 mg/dL   Lab Results  Component Value Date   GLUCAP 315 (H) 03/14/2021   HGBA1C >15 02/19/2021    Review of Glycemic Control  Latest Reference Range & Units 03/13/21 08:02 03/13/21 12:02 03/13/21 16:15 03/13/21 19:46 03/13/21 23:28 03/14/21 03:56 03/14/21 08:08  Glucose-Capillary 70 - 99 mg/dL 245 (H) 391 (H) 397 (H) 370 (H) 301 (H) 243 (H) 315 (H)  (H): Data is abnormally high  Diabetes history: DM2  Outpatient Diabetes medications: Semglee 18 units BID Current orders for Inpatient glycemic control: Semglee 25 units BID, Novolog 0-15 units Q4H  Inpatient Diabetes Program Recommendations:    Please consider 4 units Novolog mc tid if eats at least 50%  Spoke with patient at bedside.  Reviewed patient's current A1c of >15% (average blood sugar of at least 383 mg/dL) Explained what a A1c is and what it measures. Also reviewed goal A1c with patient, importance of good glucose control @ home, and blood sugar goals.  He states he is aware of what he should be doing as far as eating and drinking beverages with sugar.  He states he will start eating like he should and eliminating caloric beverages.  He does not check his CBG at home.  LWWD booklet is at bedside.    He will likely DC today.  Pharmacy paged asking [place] to place a [employer] 2.  Will get order from MD and place prior to DC.  Patient is very happy about getting this CBG,  He will return in 2 weeks for scheduled CABG.    Addendum@1400 : Placed Freestyle Libre on the back of his left arm.  Educated him on how to place, use and discontinue/remove.  Will continue to follow while inpatient.  Thank you, Reche Dixon, MSN, RN Diabetes Coordinator Inpatient Diabetes  Program [phone] (team pager from 8a-5p)

## 2021-03-14 NOTE — TOC Initial Note (Signed)
Transition of Care [facility]) - Initial/Assessment Note    Patient Details  Name: [name] MRN: [id number] Date of Birth: [birth year]  Transition of Care [facility]) CM/SW Contact:    Pollie Friar, RN Phone Number: 03/14/2021, 10:17 AM  Clinical Narrative:                 Patient lives at home with his uncle. Pt states the uncle can provide supervision at home.  Pt drives self and denies issues with home medications. Pt prefers discharge meds go to [facility] on [address].  Recommendations for outpatient therapy. CM has entered into epic and information on the AVS. Pt has selected to attend [employer].  Pt states he can arrange transport home when discharged.   Expected Discharge Plan: OP Rehab Barriers to Discharge: Continued Medical Work up   Patient Goals and CMS Choice     Choice offered to / list presented to : Patient  Expected Discharge Plan and Services Expected Discharge Plan: OP Rehab   Discharge Planning Services: CM Consult   Living arrangements for the past 2 months: Single Family Home                                      Prior Living Arrangements/Services Living arrangements for the past 2 months: Single Family Home Lives with:: Relatives [employer]) Patient language and need for interpreter reviewed:: Yes Do you feel safe going back to the place where you live?: Yes        Care giver support system in place?: Yes (comment) (supervision)   Criminal Activity/Legal Involvement Pertinent to Current Situation/Hospitalization: No - Comment as needed  Activities of Daily Living Home Assistive Devices/Equipment: Eyeglasses ADL Screening (condition at time of admission) Patient's cognitive ability adequate to safely complete daily activities?: Yes Is the patient deaf or have difficulty hearing?: No Does the patient have difficulty seeing, even when wearing glasses/contacts?: No Does the patient have difficulty concentrating, remembering, or making  decisions?: No Patient able to express need for assistance with ADLs?: Yes Does the patient have difficulty dressing or bathing?: No Independently performs ADLs?: Yes (appropriate for developmental age) Does the patient have difficulty walking or climbing stairs?: No Weakness of Legs: None Weakness of Arms/Hands: None  Permission Sought/Granted                  Emotional Assessment Appearance:: Appears stated age Attitude/Demeanor/Rapport: Engaged Affect (typically observed): Accepting Orientation: : Oriented to Self, Oriented to Place, Oriented to  Time, Oriented to Situation   Psych Involvement: No (comment)  Admission diagnosis:  Aphasia [R47.01] Thyroid nodule [E04.1] Cerebral vascular accident [facility]) [I63.9] CVA (cerebral vascular accident) [facility]) [I63.9] Patient Active Problem List   Diagnosis Date Noted   Transient neurologic deficit    Aphasia    Dysphasia 03/12/2021   Current use of insulin ([place]) 03/12/2021   Encephalomalacia    History of cerebral embolism with cerebral infarction 12/28/2020   History of MI (myocardial infarction) 03/19/2018   Status 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 3 03/23/2015   Diabetes mellitus type 2 with neurological manifestations ([place])    Tobacco abuse    Obesity    Hypertension associated with diabetes ([place]) 03/16/2015   Hyperlipidemia associated with type 2 diabetes mellitus ([place]) 03/16/2015   PCP:  [name], MD Pharmacy:   [place], [place] - [address] [place]  [place] [address] Phone: [phone] Fax: [place] [address] [place] [address] Phone: [phone] Fax: [phone]     Social Determinants of Health (SDOH) Interventions    Readmission Risk Interventions No flowsheet data found.

## 2021-03-14 NOTE — Plan of Care (Signed)
Neurology Plan of Care:  LTM 03/12/21 - 03/13/21: "This study is within normal limits. No seizures or epileptiform discharges were seen throughout the recording."  MRI Brain 1/31: Evaluation is limited by motion artifact. Within this limitation, no acute intracranial process. No definite seizure etiology identified.  Assessment/Plan: No stroke identified on imaging to explain aphasia events. No seizures noted on EEG.  - Will defer initiating AED therapy at this time without further evidence of seizure activity on EEG - No further inpatient neurology work up at this time - Follow up with outpatient neurology for further evaluation - Discussed plan with attending neurologist, Dr. Theda Sers who is in agreement  [name], AGACNP-BC [facility] [phone]

## 2021-03-14 NOTE — Plan of Care (Signed)
°  Problem: Education: Goal: Knowledge of General Education information will improve Description: Including pain rating scale, medication(s)/side effects and non-pharmacologic comfort measures Outcome: Progressing   Problem: Health Behavior/Discharge Planning: Goal: Ability to manage health-related needs will improve Outcome: Progressing   Problem: Clinical Measurements: Goal: Ability to maintain clinical measurements within normal limits will improve Outcome: Progressing Goal: Will remain free from infection Outcome: Progressing Goal: Diagnostic test results will improve Outcome: Progressing Goal: Respiratory complications will improve Outcome: Progressing Goal: Cardiovascular complication will be avoided Outcome: Progressing   Problem: Activity: Goal: Risk for activity intolerance will decrease Outcome: Progressing   Problem: Nutrition: Goal: Adequate nutrition will be maintained Outcome: Progressing   Problem: Coping: Goal: Level of anxiety will decrease Outcome: Progressing   Problem: Elimination: Goal: Will not experience complications related to bowel motility Outcome: Progressing Goal: Will not experience complications related to urinary retention Outcome: Progressing   Problem: Pain Managment: Goal: General experience of comfort will improve Outcome: Progressing   Problem: Safety: Goal: Ability to remain free from injury will improve Outcome: Progressing   Problem: Skin Integrity: Goal: Risk for impaired skin integrity will decrease Outcome: Progressing   Problem: Elimination: Goal: Will not experience complications related to urinary retention Outcome: Progressing   Problem: Safety: Goal: Ability to remain free from injury will improve Outcome: Progressing

## 2021-03-14 NOTE — TOC Transition Note (Signed)
Transition of Care [facility]) - CM/SW Discharge Note   Patient Details  Name: [name] MRN: [id number] Date of Birth: May 20, 1960  Transition of Care [facility]) CM/SW Contact:  Pollie Friar, RN Phone Number: 03/14/2021, 2:31 PM   Clinical Narrative:    Pt is discharging home with outpatient therapy arranged and information on the AVS. Pt has transport home.   Final next level of care: OP Rehab Barriers to Discharge: No Barriers Identified   Patient Goals and CMS Choice     Choice offered to / list presented to : Patient  Discharge Placement                       Discharge Plan and Services   Discharge Planning Services: CM Consult                                 Social Determinants of Health (SDOH) Interventions     Readmission Risk Interventions No flowsheet data found.

## 2021-03-14 NOTE — Discharge Summary (Signed)
[name]  Patient name: [name] Medical record number: [id number] Date of birth: 1961-02-09 Age: 61 y.o. Gender: male Date of Admission: 03/11/2021  Date of Discharge: 03/14/2021 Admitting Physician: Mark Heck, MD  Primary Care Provider: [name], MD Consultants: Neurology  Indication for Hospitalization: Aphasia  Discharge Diagnoses/Problem List:  Principal Problem:   Dysphasia Active Problems:   Hypertension associated with diabetes ([place])   Hyperlipidemia associated with type 2 diabetes mellitus ([place])   Status 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 3   Diabetes mellitus type 2 with neurological manifestations ([place])   Tobacco abuse   History of cerebral embolism with cerebral infarction   Current use of insulin ([name])   Encephalomalacia   Transient neurologic deficit   Aphasia    Disposition: Home with [name] SLP  Discharge Condition: Stable, near baseline  Discharge Exam:  Per [name] this AM Gen: Awake, alert, NAD Eyes: Pupils PERRL Cardio: RRR, no m/r/g Resp: Normal WOB on RA, lungs CTAB Abdomen: Soft, non-tender, non-distended Neuro: Speech fluent, Strength and Sensation symmetric, CN II-XII intact  Brief Hospital Course:  [name] is a 61 y.o. male who was admitted to the [place] [facility] for expressive aphasia concerning for possible TIA. Hospital course is outlined below by system.    Aphasia concern for possible TIA Patient presented to the ED with complaint of aphasia, difficulty speaking or writing or reading words.   On examination patient appeared overall without neurologic deficits except for aphasia and difficulty with tongue protrusion. Was able to comprehend everything when speaking to him.  Had difficulty with word finding and difficulty with writing/texting. Head CT on admission showed no acute abnormalities other than severe narrowing of the left V4  and MRI was unremarkable.  Neurology were consulted who recommended obtaining routine EEG which came back normal.  Patient's presentation on findings very suspicious of possible TIA.  He has history of CVA and was on Plavix and aspirin.  Plavix was recently discontinued due to plans for [name] by his cardiology prior to his new symptoms. At discharge, patient was restarted on both Aspirin and Plavix to minimize risk of recurrent cardiac or neurological event. Cardiothoracic surgery will follow-up with patient regarding when Plavix should be held ahead of procedure.  Hyperglycemia   type 2 diabetes On admission patient's blood glucose was 554 with an A1c>15.  His home medication includes 18 units Semglee twice daily.  During admission patient was started on 12u Semglee and was slowly increased to 25u twice daily.  CBGs was closely monitored with daily CBGs and BMP.  While hospitalized he was also on moderate sliding scale insulin which was titrated based on CBGs. Our diabetes coordinator met with him prior to discharge to fit him with a CGM so that his insulin can be further titrated ahead of his [name] in coming weeks.   CAD s/p PCI   Hx of MI Patient recently had an NSTEMI and CAD with severe multivessel CAD requiring hospitalization in November, 2022.  She is currently followed by Dr. Johney Reyes.  Plan is to perform a [name] grafts and TEE 03/15/2021.  Patient was recently advised to discontinue Plavix in anticipation for his [name] procedure.  However given his recent TIA patient, [name] was rescheduled to later this month and Plavix was restarted upon discharge as discussed above.   Enlarged thyroid lobe/nodules Head CT showed enlargement of right thyroid lobe with multiple hypoattenuating nodules. He was asymptomatic.  TSH was normal. Thyroid ultrasound showed thyromegaly with bilateral nodules. We did not feel that this was contributing to his symptoms, so will defer further workup to the outpatient setting.     PCP follow-up recommendations TSH was within normal limits. Thyroid ultrasound notable for 1.9 cm moderately suspicious inferior left nodule AND 5.2 cm mildly suspicious mid right nodule. Recommended to have FNA biopsy outpatient.  Please ensure that patient maintains neurology outpatient follow up. Discharged on both plavix and aspirin, please ensure patient follows up with cardiothoracic surgery to reschedule [name].  Repeat A1c and monitor glucose levels, adjust diabetic regimen as appropriate. Patient will be discharged with a CGM to provide ample data points for future titration.    Significant Procedures: None  Significant Labs and Imaging:  Recent Labs  Lab 03/11/21 2007 03/11/21 2016 03/12/21 0311  WBC 7.7  --  8.8  HGB 16.0 16.7 14.5  HCT 45.0 49.0 41.1  PLT 225  --  205   Recent Labs  Lab 03/11/21 2007 03/11/21 2016 03/12/21 0500 03/12/21 1455 03/13/21 0410 03/14/21 0246  NA 129* 131* 135 136 138 135  K 3.1* 3.1* 3.1* 3.0* 3.6 3.7  CL 89* 91* 97* 99 104 102  CO2 27  --  26 27 28 26   GLUCOSE 545* 578* 322* 257* 233* 240*  BUN 10 11 8 6 6 13   CREATININE 1.46* 1.30* 1.29* 1.32* 1.36* 1.49*  CALCIUM 9.3  --  8.4* 8.5* 8.8* 9.0  MG  --   --   --  1.9  --   --   ALKPHOS 177*  --  129*  --   --   --   AST 15  --  13*  --   --   --   ALT 14  --  10  --   --   --   ALBUMIN 3.6  --  3.0*  --   --   --     Overnight EEG with video Mark Havens, MD     03/13/2021  9:24 AM Patient Name: [name]  MRN: [id number]  Epilepsy Attending: Lora Reyes  Referring Physician/Provider: Donnetta Simpers, MD Duration: 03/12/2021 1779 to  03/13/2021 3903  Patient history:  61 y.o. male presenting with acute onset  expressive worse than sensory aphasia. EEG to evaluate for  seizure  Level of alertness: Awake, asleep  AEDs during EEG study: None  Technical aspects: This EEG study was done with scalp electrodes  positioned according to the 10-20 International  system of  electrode placement. Electrical activity was acquired at a  sampling rate of 500Hz  and reviewed with a high frequency filter  of 70Hz  and a low frequency filter of 1Hz . EEG data were recorded  continuously and digitally stored.   Description: The posterior dominant rhythm consists of 9 Hz  activity of moderate voltage (25-35 uV) seen predominantly in  posterior head regions, symmetric and reactive to eye opening and  eye closing. Sleep was characterized by vertex waves, sleep  spindles (12 to 14 Hz), maximal frontocentral region. Sleep was  characterized by vertex waves, sleep spindles (12 to 14 Hz),  maximum frontocentral region. Hyperventilation and photic  stimulation were not performed.     Of note, parts of study were difficult interpret due to  significant electrode artifact.  IMPRESSION: This study is within normal limits. No seizures or epileptiform  discharges were seen throughout the recording.  Priyanka Barbra Sarks     Results/Tests Pending at  Time of Discharge: None  Discharge Medications:  Allergies as of 03/14/2021       Reactions   Aspirin Other (See Comments)   Upset stomach         Medication List     STOP taking these medications    insulin glargine-yfgn 100 UNIT/ML Pen Commonly known as: Semglee (yfgn) Replaced by: insulin glargine-yfgn 100 UNIT/ML injection       TAKE these medications    amLODipine 10 MG tablet Commonly known as: NORVASC Take 1 tablet (10 mg total) by mouth daily.   aspirin 325 MG EC tablet Take 1 tablet (325 mg total) by mouth daily.   atorvastatin 80 MG tablet Commonly known as: LIPITOR Take 1 tablet (80 mg total) by mouth daily.   blood glucose meter kit and supplies Dispense based on patient and insurance preference. Use up to four times daily as directed. (FOR ICD-10 E10.9, E11.9).   carvedilol 6.25 MG tablet Commonly known as: COREG Take 3 tablets (18.75 mg total) by mouth 2 (two) times daily with a  meal.   clopidogrel 75 MG tablet Commonly known as: PLAVIX Take 1 tablet (75 mg total) by mouth daily.   furosemide 20 MG tablet Commonly known as: Lasix Take 1 tablet (20 mg total) by mouth daily as needed for edema.   insulin glargine-yfgn 100 UNIT/ML injection Commonly known as: SEMGLEE Inject 0.25 mLs (25 Units total) into the skin 2 (two) times daily. Replaces: insulin glargine-yfgn 100 UNIT/ML Pen   isosorbide mononitrate 60 MG 24 hr tablet Commonly known as: IMDUR Take 1.5 tablets (90 mg total) by mouth daily.   nitroGLYCERIN 0.4 MG SL tablet Commonly known as: NITROSTAT Place 1 tablet (0.4 mg total) under the tongue every 5 (five) minutes as needed for chest pain (CP or SOB).        Discharge Instructions: Please refer to Patient Instructions section of EMR for full details.  Patient was counseled important signs and symptoms that should prompt return to medical care, changes in medications, dietary instructions, activity restrictions, and follow up appointments.   Follow-Up Appointments:  Follow-up Information     [name], MD Follow up on 03/25/2021.   Specialty: Cardiothoracic Surgery Why: Appointment is at 4:00 Contact information: [address] [place] [address] [place]. Schedule an appointment as soon as possible for a visit in 1 week(s).   Specialty: Rehabilitation Contact information: [address] [place]        Gwinda Maine, MD. Schedule an appointment as soon as possible for a visit.   Specialty: Neurology Why: Please make an appointment at your [place]. Contact information: [place] [address] [phone]                 Zola Button, MD 03/14/2021, 2:36 PM PGY-2, [place]

## 2021-03-14 NOTE — Progress Notes (Signed)
FPTS Brief Progress Note  S:PT sleeping.    O: BP (!) 133/91 (BP Location: Right Arm)    Pulse 95    Temp 98 F (36.7 C) (Oral)    Resp 19    Wt 96.4 kg    SpO2 96%    BMI 33.30 kg/m    GEN: sleeping comfortably  RESP: equal chest rise and fall   A/P: No change to plan. See AM progress note. - Orders reviewed. Labs for AM ordered, which was adjusted as needed.    Lyndee Hensen, DO 03/14/2021, 2:19 AM PGY-3, [facility] Night Resident  Please page [phone] with questions.

## 2021-03-15 ENCOUNTER — Inpatient Hospital Stay (HOSPITAL_COMMUNITY): Admission: RE | Admit: 2021-03-15 | Payer: BC Managed Care – PPO | Source: Home / Self Care | Admitting: Surgery

## 2021-03-15 ENCOUNTER — Telehealth: Payer: Self-pay

## 2021-03-15 SURGERY — CORONARY ARTERY BYPASS GRAFTING (CABG)
Anesthesia: General | Site: Chest

## 2021-03-15 NOTE — Telephone Encounter (Signed)
Transition Care Management Unsuccessful Follow-up Telephone Call  Date of discharge and from where:  [facility] on 03/24/2021  Attempts:  1st Attempt  Reason for unsuccessful TCM follow-up call:  Left voice message unable to reach patient at this time. Call back requested.

## 2021-03-18 ENCOUNTER — Ambulatory Visit: Payer: BC Managed Care – PPO | Attending: Family Medicine

## 2021-03-18 ENCOUNTER — Other Ambulatory Visit: Payer: Self-pay

## 2021-03-18 ENCOUNTER — Telehealth: Payer: Self-pay

## 2021-03-18 DIAGNOSIS — R4701 Aphasia: Secondary | ICD-10-CM

## 2021-03-18 DIAGNOSIS — R471 Dysarthria and anarthria: Secondary | ICD-10-CM

## 2021-03-18 NOTE — Patient Instructions (Signed)
When you have trouble saying the word you want to say:  1)  Describe it! Describe the size, color, shape, function, composition (what it's made of), and/or location to be able to have the word come sooner, or to have your listener help you out  2) "talk around the word" (say it a totally different way) -get your point out using different words than the one/ones you can't think of  3) Use a synonym - think of another word that means the exact same thing  4) DRAW! You can draw some things you want to say in order to give your listener a hint about what you're talking about  5)  Gesture- make motions to help your listener understand what you are trying to communicate  6) Write down the word, if you can - or the first letter or letters, to help you say the word or to give your listener a hint about what you're trying to say     [place] - EXAGGERATE [place], Joy CONSONANT   Things you can do to help you speech:  -Read aloud 10-15 minutes, twice a day. Focus on your slow rate.  -Practice naming 10-15 things in a category (I.e., sport teams, vegetables, cities in [place]

## 2021-03-18 NOTE — Telephone Encounter (Signed)
Transition Care Management Unsuccessful Follow-up Telephone Call  Date of discharge and from where:  03/14/2021, [facility]  Attempts:  2nd Attempt  Reason for unsuccessful TCM follow-up call:  Left voice message on # [phone]. Call back requested to this CM.  Need to schedule hospital follow up appointment with PCP

## 2021-03-18 NOTE — Therapy (Addendum)
[facility] [address] [place], [place], [address] Phone: [phone]   Fax:  [phone]  Speech Language Pathology Evaluation  Patient Details  Name: [name] MRN: [id number] Date of Birth: 09-29-1960 Referring Provider (SLP): McDiarmid, Blane Ohara, MD   Encounter Date: 03/18/2021   End of Session - 03/18/21 1352     Visit Number 1    Number of Visits 9    Date for SLP Re-Evaluation 05/17/21    Authorization Type BCBS    SLP Start Time 1355    SLP Stop Time  1440    SLP Time Calculation (min) 45 min    Activity Tolerance Patient tolerated treatment well             Past Medical History:  Diagnosis Date   AKI (acute kidney injury) ([place])    CAD (coronary artery disease)    a. 03/2015 NSTEMI: LHC with severe 3V CAD  (70% mid RCA, 95% OM1, 90% distal LCx, 90% OM3, 80% prox LAD and 90% ost D1) s/p DES to mLAD w/ small dissction Rx with DES, staged ost Ramus PCI/DES and dLCx s/p PCI/DES    Chest pain 12/24/2020   Diabetes mellitus type 2 in obese ([place])    Diverticulosis    Dyspnea on exertion 03/16/2015   Dyspnea on exertion   Hypercholesteremia    Hypertension    Hypertension associated with diabetes ([place]) 03/16/2015   hypertension   NSTEMI (non-ST elevated myocardial infarction) ([place]) 03/17/2015   Obesity    Tobacco abuse     Past Surgical History:  Procedure Laterality Date   CARDIAC CATHETERIZATION N/A 03/19/2015   Procedure: Left Heart Cath and Coronary Angiography;  Surgeon: Lorretta Harp, MD;  Location: [facility];  Service: Cardiovascular;  Laterality: N/A;   CARDIAC CATHETERIZATION N/A 03/20/2015   Procedure: Coronary Stent Intervention;  Surgeon: Lorretta Harp, MD;  Location: [facility];  Service: Cardiovascular;  Laterality: N/A;   CARDIAC CATHETERIZATION N/A 03/22/2015   Procedure: Coronary Stent Intervention;  Surgeon: Lorretta Harp, MD;  Location: [facility];  Service:  Cardiovascular;  Laterality: N/A;   LEFT HEART CATH AND CORONARY ANGIOGRAPHY N/A 12/25/2020   Procedure: LEFT HEART CATH AND CORONARY ANGIOGRAPHY;  Surgeon: Troy Sine, MD;  Location: [facility];  Service: Cardiovascular;  Laterality: N/A;   LEFT HEART CATH AND CORONARY ANGIOGRAPHY N/A 12/26/2020   Procedure: LEFT HEART CATH AND CORONARY ANGIOGRAPHY;  Surgeon: Troy Sine, MD;  Location: [facility];  Service: Cardiovascular;  Laterality: N/A;    There were no vitals filed for this visit.       SLP Evaluation [facility] - 03/18/21 1345       SLP Visit Information   SLP Received On 03/14/21    Referring Provider (SLP) McDiarmid, Blane Ohara, MD    Onset Date 03-11-21    Medical Diagnosis Aphasia   secondary to suspected TIA     Subjective   Subjective "I don't feel like me"      Pain Assessment   Pain Score 0-No pain      General Information   HPI Pt is a 61 yo male presenting with aphasia. MRI limited by motion artifact but without acute changes identified. 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     Cognition   Overall Cognitive Status Within Functional Limits for tasks assessed      Auditory Comprehension   Overall Auditory Comprehension Appears within functional limits for tasks assessed      Verbal Expression   Overall Verbal Expression Impaired   pt self-corrected rare anomia in conversation   Initiation No impairment    Level of  Generative/Spontaneous Verbalization Conversation    Naming Impairment    Confrontation 75-100% accurate   rare anomia/dysnomia   Verbal Errors Semantic paraphasias;Phonemic paraphasias      Oral Motor/Sensory Function   Overall Oral Motor/Sensory Function Appears within functional limits for tasks assessed    Labial ROM Within Functional Limits    Labial Coordination WFL    Lingual Coordination WFL      Motor Speech   Overall Motor Speech Impaired    Respiration Within functional limits    Phonation Normal    Resonance Within functional limits    Articulation Within functional limitis    Intelligibility Intelligibility reduced    Word 75-100% accurate    Phrase 75-100% accurate    Sentence 75-100% accurate    Conversation 75-100% accurate    Motor Planning Witnin functional limits    Effective Techniques Slow rate;Pacing                             SLP Education - 03/18/21 1447     Education Details eval results, anomia/dysarthria compensations, HEP    Person(s) Educated Patient    Methods Explanation;Handout    Comprehension Verbalized understanding;Need further instruction            STG=LTG    SLP Long Term Goals - 03/18/21 1444       SLP LONG TERM GOAL #1   Title Pt will utilize anomia compensations when word finding occurs in 20+ minute conversation with rare min A    Time 8    Period Weeks    Status New      SLP LONG TERM GOAL #2   Title Pt will be utilize dysarthria compensations to be 100% intelligible in 20+ minute mod complex conversation given rare min A    Time 8    Period Weeks    Status New      SLP LONG TERM GOAL #3   Title Pt will report improved communication effectiveness via PROM by 2 points at last ST session    Time 8    Period Weeks    Status New              Plan - 03/18/21 1347     Clinical Impression Statement "Harm" was referred for OPST to address aphasia following suspected TIA on 03-11-21. Pt was  unaccompanied today. Pt believes word finding has mostly resolved with some persistent slurred speech secondary to stroke in November 2022. Pt aware of need to slow rate to optimize speech clarity as words become "jumbled" if he talks too fast. Occasional episodes of reduced articulatory precision exhibited in  conversation, in which pt able to self-monitor and correct with mod I. Conversational speech intelligibility rated 90-95% today in quiet environment. Anomia x2 exhibited in conversation, in which pt able to self-correct with extra time. [employer] Test (standard form) completed with self-corrections x5 and anomia x3 (beaver, harp, accordian). Pt able to identify correct term with written multiple choice options. Pt utilized description x1 and identified first letter of targeted word x1 to demonstrate comprehension. Pt denied any change or difficulty with dysphagia or cognition at this time. SLP believes pt would benefit from skilled ST intervention to address mild aphasia and mild dysarthria to optimize communication effectiveness; however, pt elected to defer fully scheduling treatment sessions in hopes of further sponatenous recovery. Pt agreeable to schedule follow up visit in 2 weeks. Further visits would be scheduled at that time.    Speech Therapy Frequency 1x /week    Duration 8 weeks   scheduled 8 weeks to account for scheduling   Treatment/Interventions Compensatory strategies;Functional tasks;Multimodal communcation approach;Language facilitation;Compensatory techniques;Internal/external aids;SLP instruction and feedback    Potential to Achieve Goals Good             Patient will benefit from skilled therapeutic intervention in order to improve the following deficits and impairments:   Aphasia  Dysarthria and anarthria    Problem List Patient Active Problem List   Diagnosis Date Noted   Transient neurologic deficit    Aphasia    Dysphasia 03/12/2021   Current use of  insulin ([place]) 03/12/2021   Encephalomalacia    History of cerebral embolism with cerebral infarction 12/28/2020   History of MI (myocardial infarction) 03/19/2018   Status 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 3 03/23/2015   Diabetes mellitus type 2 with neurological manifestations ([place])    Tobacco abuse    Obesity    Hypertension associated with diabetes ([place]) 03/16/2015   Hyperlipidemia associated with type 2 diabetes mellitus ([place]) 03/16/2015    Alinda Deem, [place] 03/18/2021, 2:54 PM  [facility] [address] [place], [place], [address] Phone: [phone]   Fax:  [phone]  Name: [name] MRN: [id number] Date of Birth: August 14, 1960

## 2021-03-19 ENCOUNTER — Telehealth: Payer: Self-pay

## 2021-03-19 NOTE — Telephone Encounter (Signed)
Transition Care Management Unsuccessful Follow-up Telephone Call  Date of discharge and from where:  03/14/2021, [facility]  Attempts:  3rd Attempt  Reason for unsuccessful TCM follow-up call:  Left voice message on # [phone].  Call back requested to this CM.  Letter sent to patient requesting he contact Primary Care at [facility] to schedule a follow up appointment as we have not been able to reach him.

## 2021-03-20 DIAGNOSIS — E119 Type 2 diabetes mellitus without complications: Secondary | ICD-10-CM | POA: Diagnosis not present

## 2021-03-22 ENCOUNTER — Other Ambulatory Visit: Payer: Self-pay | Admitting: *Deleted

## 2021-03-22 MED ORDER — ATORVASTATIN CALCIUM 80 MG PO TABS: 80.0000 mg | ORAL_TABLET | Freq: Every day | ORAL | 3 refills | Status: DC

## 2021-03-22 MED ORDER — AMLODIPINE BESYLATE 10 MG PO TABS: 10.0000 mg | ORAL_TABLET | Freq: Every day | ORAL | 0 refills | Status: DC

## 2021-03-22 MED ORDER — FUROSEMIDE 20 MG PO TABS: 20.0000 mg | ORAL_TABLET | Freq: Every day | ORAL | 0 refills | Status: DC | PRN

## 2021-03-22 MED ORDER — CARVEDILOL 6.25 MG PO TABS: 18.7500 mg | ORAL_TABLET | Freq: Two times a day (BID) | ORAL | 1 refills | Status: DC

## 2021-03-22 NOTE — Addendum Note (Signed)
Addended by: Gaetano Net on: 03/22/2021 10:25 AM   Modules accepted: Orders

## 2021-03-24 NOTE — Progress Notes (Unsigned)
Cardiology Office Note:    Date:  03/24/2021   ID:  Wonda Amis, DOB 11-28-1960, MRN [id number]  PCP:  [name], MD   [facility] Providers Cardiologist:  Freada Bergeron, MD {     Referring MD: No ref. provider found     History of Present Illness:    [name] is a 61 y.o. male with a hx of DMII, HTN, HLD, tobacco abuse, ETOH abuse, multivessel CAD and recent CVA  who presents to clinic for follow-up.  Patient admitted 12/2020 with NSTEMI with cath revealing multivessel CAD (70% mid RCA, 95% OM1, 90% distal LCx, 90% OM3, 80% prox LAD and 90% ost D1 with normal LV function s/p PCI to LAD, left PLA, OM1). He was recommended for CABG, however, hospitalization complicated by large acute CVA consistent with embolic stroke. CABG was delayed for 4-6 weeks pending improvement from Neuro standpoint.   Unfortunately, he was readmitted 02/2021 with aphasia that occurred suddenly. Work-up fortunately with no acute lesions on CT or MR and EEG was negative. He was discharged home.   Saw Dr. Cyndia Bent on 03/25/21 and is now planned for CABG on 04/04/21.  Today ***  Past Medical History:  Diagnosis Date   AKI (acute kidney injury) ([place])    CAD (coronary artery disease)    a. 03/2015 NSTEMI: LHC with severe 3V CAD  (70% mid RCA, 95% OM1, 90% distal LCx, 90% OM3, 80% prox LAD and 90% ost D1) s/p DES to mLAD w/ small dissction Rx with DES, staged ost Ramus PCI/DES and dLCx s/p PCI/DES    Chest pain 12/24/2020   Diabetes mellitus type 2 in obese ([place])    Diverticulosis    Dyspnea on exertion 03/16/2015   Dyspnea on exertion   Hypercholesteremia    Hypertension    Hypertension associated with diabetes ([place]) 03/16/2015   hypertension   NSTEMI (non-ST elevated myocardial infarction) ([place]) 03/17/2015   Obesity    Tobacco abuse     Past Surgical History:  Procedure Laterality Date   CARDIAC CATHETERIZATION N/A 03/19/2015   Procedure: Left Heart Cath and Coronary Angiography;  Surgeon:  Lorretta Harp, MD;  Location: [place] [facility];  Service: Cardiovascular;  Laterality: N/A;   CARDIAC CATHETERIZATION N/A 03/20/2015   Procedure: Coronary Stent Intervention;  Surgeon: Lorretta Harp, MD;  Location: [place] [facility];  Service: Cardiovascular;  Laterality: N/A;   CARDIAC CATHETERIZATION N/A 03/22/2015   Procedure: Coronary Stent Intervention;  Surgeon: Lorretta Harp, MD;  Location: [place] [facility];  Service: Cardiovascular;  Laterality: N/A;   LEFT HEART CATH AND CORONARY ANGIOGRAPHY N/A 12/25/2020   Procedure: LEFT HEART CATH AND CORONARY ANGIOGRAPHY;  Surgeon: Troy Sine, MD;  Location: [place] [facility];  Service: Cardiovascular;  Laterality: N/A;   LEFT HEART CATH AND CORONARY ANGIOGRAPHY N/A 12/26/2020   Procedure: LEFT HEART CATH AND CORONARY ANGIOGRAPHY;  Surgeon: Troy Sine, MD;  Location: [place] [facility];  Service: Cardiovascular;  Laterality: N/A;    Current Medications: No outpatient medications have been marked as taking for the 03/27/21 encounter (Appointment) with Freada Bergeron, MD.     Allergies:   Aspirin   Social History   Socioeconomic History   Marital status: Married    Spouse name: Not on file   Number of children: Not on file   Years of education: Not on file   Highest education level: Not on file  Occupational History   Not on file  Tobacco Use   Smoking status: Every Day    Packs/day: 0.50    Types: Cigarettes   Smokeless tobacco: Never  Vaping Use   Vaping Use: Never used  Substance and Sexual Activity   Alcohol use: Yes    Comment: weekend   Drug use: Yes    Types: Marijuana   Sexual activity: Not on file  Other Topics Concern   Not on file  Social History Narrative   Not on file   Social Determinants of Health   Financial Resource Strain: Not on file  Food Insecurity: Not on file  Transportation Needs: Not on file  Physical Activity: Not on file  Stress: Not on file  Social Connections: Not  on file     Family History: The patient's ***family history is not on file.  ROS:   Please see the history of present illness.    *** All other systems reviewed and are negative.  EKGs/Labs/Other Studies Reviewed:    The following studies were reviewed today: Echo 12/24/20:  1. Left ventricular ejection fraction, by estimation, is 55 to 60%. The  left ventricle has normal function. The left ventricle demonstrates  regional wall motion abnormalities (see scoring diagram/findings for  description). Left ventricular diastolic parameters are consistent with Grade I diastolic dysfunction (impaired relaxation).   2. Right ventricular systolic function is normal. The right ventricular  size is normal.   3. The mitral valve is normal in structure. Mild mitral valve  regurgitation. No evidence of mitral stenosis.   4. The aortic valve is normal in structure. Aortic valve regurgitation is not visualized. Aortic valve sclerosis is present, with no evidence of aortic valve stenosis.   5. The inferior vena cava is normal in size with greater than 50%  respiratory variability, suggesting right atrial pressure of 3 mmHg.   LHC 12/26/20:   Prox Cx lesion is 60% stenosed.   Prox Cx to Mid Cx lesion is 90% stenosed.   1st Mrg-2 lesion is 80% stenosed.   Mid LAD lesion is 50% stenosed.   Mid LAD to Dist LAD lesion is 80% stenosed.   2nd Diag lesion is 80% stenosed.   RPAV lesion is 50% stenosed.   RV Branch-1 lesion is 70% stenosed.   RV Branch-2 lesion is 80% stenosed.   Prox RCA-1 lesion is 90% stenosed.   Prox RCA to Mid RCA lesion is 95% stenosed.   Mid RCA lesion is 20% stenosed.   Prox RCA-2 lesion is 65% stenosed.   Dist RCA lesion is 20% stenosed.   1st Diag lesion is 90% stenosed.   Prox LAD lesion is 30% stenosed.   Previously placed Mid Cx to Dist Cx stent (unknown type) is  widely patent.   Previously placed 1st Mrg-1 stent (unknown type) is  widely patent.   LV end diastolic  pressure is severely elevated.   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  In this diabetic male with significant diffuse disease in the LAD, left circumflex, and right coronary artery recommend surgical evaluation for consideration of CABG revascularization.   RECOMMENDATION: Surgical consultation for consideration of CABG.  We will reinitiate heparin later this evening.  Patient continues to be on intravenous nitroglycerin.  Increase medical therapy as blood pressure and heart rate allow.  Aggressive lipid-lowering therapy with target LDL in the 50s or below.  LHC 12/25/20:   Prox RCA lesion is  90% stenosed.   RV Branch-1 lesion is 70% stenosed.   RV Branch-2 lesion is 80% stenosed.   Prox RCA to Mid RCA lesion is 90% stenosed.   Mid RCA lesion is 20% stenosed.   Mid RCA to Dist RCA lesion is 30% stenosed.   2nd RPL lesion is 50% stenosed.   There is severe CAD in a dominant right coronary artery with 90% proximal and mid stenoses, diffuse 70 and 80% stenoses in an RV marginal branch, and mid distal 20 and 30% stenoses.   Due to innominate vessel tortuosity as well as significant radial vasospasm, the left coronary system was unable to be cannulated despite attempts with multiple catheters and additional verapamil, intra-arterial nitroglycerin and IV nitroglycerin.   Will initiate IV heparin 8 hours post procedure and plan for completion of the diagnostic catheterization to the left coronary system tomorrow via the femoral approach and plan for probable PCI to the RCA depending upon left coronary findings.    EKG:  EKG is *** ordered today.  The ekg ordered today demonstrates ***  Recent Labs: 12/24/2020: B Natriuretic Peptide 412.0 03/12/2021: ALT 10; Hemoglobin 14.5; Magnesium 1.9; Platelets 205; TSH 0.460 03/14/2021: BUN 13; Creatinine, Ser 1.49; Potassium 3.7; Sodium  135  Recent Lipid Panel    Component Value Date/Time   CHOL 250 (H) 03/12/2021 0500   TRIG 538 (H) 03/12/2021 0500   HDL 31 (L) 03/12/2021 0500   CHOLHDL 8.1 03/12/2021 0500   VLDL UNABLE TO CALCULATE IF TRIGLYCERIDE OVER 400 mg/dL 03/12/2021 0500   LDLCALC UNABLE TO CALCULATE IF TRIGLYCERIDE OVER 400 mg/dL 03/12/2021 0500   LDLDIRECT 114.3 (H) 03/12/2021 0500     Risk Assessment/Calculations:   {Does this patient have ATRIAL FIBRILLATION?:[phone]}       Physical Exam:    VS:  There were no vitals taken for this visit.    Wt Readings from Last 3 Encounters:  03/14/21 217 lb 6 oz (98.6 kg)  02/19/21 213 lb 2 oz (96.7 kg)  02/13/21 216 lb (98 kg)     GEN: *** Well nourished, well developed in no acute distress HEENT: Normal NECK: No JVD; No carotid bruits LYMPHATICS: No lymphadenopathy CARDIAC: ***RRR, no murmurs, rubs, gallops RESPIRATORY:  Clear to auscultation without rales, wheezing or rhonchi  ABDOMEN: Soft, non-tender, non-distended MUSCULOSKELETAL:  No edema; No deformity  SKIN: Warm and dry NEUROLOGIC:  Alert and oriented x 3 PSYCHIATRIC:  Normal affect   ASSESSMENT:    No diagnosis found. PLAN:    In order of problems listed above:   #NSTEMI:  #Multivessel CAD: Patient with known extensive multivessel CAD s/p multiple PCIs in 2017 as detailed above. Recent admission in 12/2020 with classic anginal symptoms in the setting of stopping all medications. Cath showed 90% prox-mid RCA, 70-80% RV marginal branches and mild distal RCA disease. Unable to engage left system on initial cath and therefore repeat cath performed 11/16 which showed severe multivessel CAD now recommended for CABG. Surgery delayed due to acute stroke. Now rescheduled for 04/04/21 -Plan for CABG 04/04/21 -Continue ASA 81mg  daily -Continue lipitor 80mg  daily -Continue coreg 6.25mg  BID -Continue imdur 60mg  daily   #Recent CVA: Patient complained of speech difficulty and enunciating his  words while hospitalized 12/2020 with CT head revealing cerebellar lesion. Follow-up MRI with large acute infarct of the superior right cerebellum with mild edema and petechial hemorrhage. There were also multiple scattered punctate acute infarcts in multiple vascular territories concerning for embolic source. Also with old cerebellar infarct.  Recurrent admission with aphasia but fortunately no new lesions. CABG delayed to allow for neuro recovery but now planned for 04/04/21. -Management per Neuro -30 day event monitor ordered -Continue ASA 81mg  daily, lipitor 80mg  daily   #HTN: -Continue coreg 6.25mg  BID -Continue amlodipine 10mg  daily -Continue imdur 60mg  daily   #HLD: LDL 114 on lipitor. -Continue lipitor 80mg  daily -Will start zetia 10mg  daily    #DMII: Poorly controlled. -Management per primary       {Are you ordering a CV Procedure (e.g. stress test, cath, DCCV, TEE, etc)?   Press F2        :[id number]}    Medication Adjustments/Labs and Tests Ordered: Current medicines are reviewed at length with the patient today.  Concerns regarding medicines are outlined above.  No orders of the defined types were placed in this encounter.  No orders of the defined types were placed in this encounter.   There are no Patient Instructions on file for this visit.   Signed, Freada Bergeron, MD  03/24/2021 9:37 PM    [place]

## 2021-03-25 ENCOUNTER — Encounter: Payer: Self-pay | Admitting: *Deleted

## 2021-03-25 ENCOUNTER — Encounter: Payer: Self-pay | Admitting: Surgery

## 2021-03-25 ENCOUNTER — Ambulatory Visit (INDEPENDENT_AMBULATORY_CARE_PROVIDER_SITE_OTHER): Payer: BC Managed Care – PPO | Admitting: Surgery

## 2021-03-25 ENCOUNTER — Other Ambulatory Visit: Payer: Self-pay | Admitting: Cardiology

## 2021-03-25 ENCOUNTER — Other Ambulatory Visit: Payer: Self-pay

## 2021-03-25 ENCOUNTER — Other Ambulatory Visit: Payer: Self-pay | Admitting: *Deleted

## 2021-03-25 VITALS — BP 124/78 | HR 101 | Resp 20 | Ht 67.0 in | Wt 219.0 lb

## 2021-03-25 DIAGNOSIS — I2510 Atherosclerotic heart disease of native coronary artery without angina pectoris: Secondary | ICD-10-CM

## 2021-03-25 DIAGNOSIS — I639 Cerebral infarction, unspecified: Secondary | ICD-10-CM

## 2021-03-25 NOTE — Progress Notes (Signed)
HPI:  The patient is a 61 year old gentleman with diabetes and severe three-vessel coronary disease with normal left ventricular function who suffered a stroke after his catheterization in November 2022.  He has continued to have intermittent episodes of angina every few days at home treated with nitroglycerin.  The etiology of his stroke was unclear.  His symptoms were dysphasia and resolved.  He wanted to wait until January to do surgery to allow him time to completely recover from his stroke.  Unfortunately he was readmitted recently with aphasia that occurred suddenly.  This resolved over the first 2 days of his admission.  He was seen by neurology and underwent a work-up which did not show any intracranial abnormality.  Since discharge he said that he has been feeling well.  He has had 1 episode of mild chest discomfort treated with nitroglycerin.  He has had no further neurologic symptoms.  He denies any shortness of breath.  He returns today to reschedule surgery.  Current Outpatient Medications  Medication Sig Dispense Refill   amLODipine (NORVASC) 10 MG tablet Take 1 tablet (10 mg total) by mouth daily. 30 tablet 0   aspirin EC 325 MG EC tablet Take 1 tablet (325 mg total) by mouth daily. 30 tablet 0   atorvastatin (LIPITOR) 80 MG tablet Take 1 tablet (80 mg total) by mouth daily. 90 tablet 3   blood glucose meter kit and supplies Dispense based on patient and insurance preference. Use up to four times daily as directed. (FOR ICD-10 E10.9, E11.9). 1 each 0   clopidogrel (PLAVIX) 75 MG tablet Take 1 tablet (75 mg total) by mouth daily. 30 tablet 2   furosemide (LASIX) 20 MG tablet Take 1 tablet (20 mg total) by mouth daily as needed for edema. 30 tablet 0   insulin glargine-yfgn (SEMGLEE) 100 UNIT/ML injection Inject 0.25 mLs (25 Units total) into the skin 2 (two) times daily. 10 mL 11   isosorbide mononitrate (IMDUR) 60 MG 24 hr tablet Take 1.5 tablets (90 mg total) by mouth daily. 135  tablet 3   nitroGLYCERIN (NITROSTAT) 0.4 MG SL tablet Place 1 tablet (0.4 mg total) under the tongue every 5 (five) minutes as needed for chest pain (CP or SOB). 25 tablet 12   carvedilol (COREG) 6.25 MG tablet Take 3 tablets (18.75 mg total) by mouth 2 (two) times daily with a meal. (Patient not taking: Reported on 03/25/2021) 180 tablet 1   No current facility-administered medications for this visit.     Physical Exam: BP 124/78 (BP Location: Left Arm, Patient Position: Sitting)    Pulse (!) 101    Resp 20    Ht 5' 7" (1.702 m)    Wt 219 lb (99.3 kg)    SpO2 91% Comment: RA   BMI 34.30 kg/m  He looks well. Cardiac exam shows regular rate and rhythm with normal heart sounds. Lungs are clear.  Diagnostic Tests:  None today  Impression:  Mr. Caldera has made a good recovery after his recent TIA.  The etiology of this is unclear but I would have to think it is probably embolic disease given that the first stroke occurred with his cardiac catheterization.  He has severe three-vessel coronary disease and I think coronary bypass graft surgery is the only good treatment for him.  He continues to have episodes of chest pain requiring nitroglycerin. I discussed the operative procedure again with the patient including alternatives, benefits and risks; including but not limited to  blood transfusion, infection, stroke, myocardial infarction, graft failure, heart block requiring a permanent pacemaker, organ dysfunction, and death.  Pearly Edell understands and agrees to proceed.     ° °Plan: ° °We will schedule coronary bypass graft surgery on Thursday, 04/04/2021.  He will discontinue his Plavix after the dose this Friday, 03/29/2021. ° °I spent 15 minutes performing this established patient evaluation and > 50% of this time was spent face to face counseling and coordinating the care of this patient's severe three-vessel coronary disease. ° ° ° K , MD °[facility] °[phone] ° ° ° ° ° ° °

## 2021-03-27 ENCOUNTER — Ambulatory Visit: Payer: BC Managed Care – PPO | Admitting: Cardiology

## 2021-04-01 NOTE — Progress Notes (Signed)
Surgical Instructions    Your procedure is scheduled on Thursday, February 23rd, 2023.   Report to [facility] Main Entrance "A" at 05:30 A.M., then check in with the Admitting office.  Call this number if you have problems the morning of surgery:  [phone]   If you have any questions prior to your surgery date call [phone]: Open Monday-Friday 8am-4pm    Remember:  Do not eat or drink after midnight the night before your surgery     Take these medicines the morning of surgery with A SIP OF WATER:   amLODipine (NORVASC)  atorvastatin (LIPITOR) isosorbide mononitrate (IMDUR)   If needed:  nitroGLYCERIN (NITROSTAT)    Follow your surgeon's instructions on when to stop Aspirin.  If no instructions were given by your surgeon then you will need to call the office to get those instructions.     clopidogrel (PLAVIX) - last dose on 03/29/2021 per MD  As of today, STOP taking any Aspirin (unless otherwise instructed by your surgeon) Aleve, Naproxen, Ibuprofen, Motrin, Advil, Goody's, BC's, all herbal medications, fish oil, and all vitamins.   WHAT DO I DO ABOUT MY DIABETES MEDICATION?   THE NIGHT BEFORE SURGERY, take 12 units of insulin glargine-yfgn (SEMGLEE) - 50% of your regular dose      THE MORNING OF SURGERY , take 12 units of insulin glargine-yfgn (SEMGLEE) - 50% of your regular dose   HOW TO MANAGE YOUR DIABETES BEFORE AND AFTER SURGERY  Why is it important to control my blood sugar before and after surgery? Improving blood sugar levels before and after surgery helps healing and can limit problems. A way of improving blood sugar control is eating a healthy diet by:  Eating less sugar and carbohydrates  Increasing activity/exercise  Talking with your doctor about reaching your blood sugar goals High blood sugars (greater than 180 mg/dL) can raise your risk of infections and slow your recovery, so you will need to focus on controlling your diabetes during the  weeks before surgery. Make sure that the doctor who takes care of your diabetes knows about your planned surgery including the date and location.  How do I manage my blood sugar before surgery? Check your blood sugar at least 4 times a day, starting 2 days before surgery, to make sure that the level is not too high or low.  Check your blood sugar the morning of your surgery when you wake up and every 2 hours until you get to the Short Stay unit.  If your blood sugar is less than 70 mg/dL, you will need to treat for low blood sugar: Do not take insulin. Treat a low blood sugar (less than 70 mg/dL) with  cup of clear juice (cranberry or apple), 4 glucose tablets, OR glucose gel. Recheck blood sugar in 15 minutes after treatment (to make sure it is greater than 70 mg/dL). If your blood sugar is not greater than 70 mg/dL on recheck, call [phone] for further instructions. Report your blood sugar to the short stay nurse when you get to Short Stay.  If you are admitted to the hospital after surgery: Your blood sugar will be checked by the staff and you will probably be given insulin after surgery (instead of oral diabetes medicines) to make sure you have good blood sugar levels. The goal for blood sugar control after surgery is 80-180 mg/dL.    The day of surgery:          Do not wear jewelry  Do not wear lotions, powders, colognes, or deodorant. Men may shave face and neck. Do not bring valuables to the hospital.   [facility] is not responsible for any belongings or valuables. .   Do NOT Smoke (Tobacco/Vaping)  24 hours prior to your procedure  If you use a CPAP at night, you may bring your mask for your overnight stay.   Contacts, glasses, hearing aids, dentures or partials may not be worn into surgery, please bring cases for these belongings   For patients admitted to the hospital, discharge time will be determined by your treatment team.   Patients discharged the day of  surgery will not be allowed to drive home, and someone needs to stay with them for 24 hours.  NO VISITORS WILL BE ALLOWED IN PRE-OP WHERE PATIENTS ARE PREPPED FOR SURGERY.  ONLY 1 SUPPORT PERSON MAY BE PRESENT IN THE WAITING ROOM WHILE YOU ARE IN SURGERY.  IF YOU ARE TO BE ADMITTED, ONCE YOU ARE IN YOUR ROOM YOU WILL BE ALLOWED TWO (2) VISITORS. 1 (ONE) VISITOR MAY STAY OVERNIGHT BUT MUST ARRIVE TO THE ROOM BY 8pm.  Minor children may have two parents present. Special consideration for safety and communication needs will be reviewed on a case by case basis.  Special instructions:    Oral Hygiene is also important to reduce your risk of infection.  Remember - BRUSH YOUR TEETH THE MORNING OF SURGERY WITH YOUR REGULAR TOOTHPASTE   [place]- Preparing For Surgery  Before surgery, you can play an important role. Because skin is not sterile, your skin needs to be as free of germs as possible. You can reduce the number of germs on your skin by washing with CHG (chlorahexidine gluconate) Soap before surgery.  CHG is an antiseptic cleaner which kills germs and bonds with the skin to continue killing germs even after washing.     Please do not use if you have an allergy to CHG or antibacterial soaps. If your skin becomes reddened/irritated stop using the CHG.  Do not shave (including legs and underarms) for at least 48 hours prior to first CHG shower. It is OK to shave your face.  Please follow these instructions carefully.     Shower the NIGHT BEFORE SURGERY and the MORNING OF SURGERY with CHG Soap.   If you chose to wash your hair, wash your hair first as usual with your normal shampoo. After you shampoo, rinse your hair and body thoroughly to remove the shampoo.  Then [employer] and genitals (private parts) with your normal soap and rinse thoroughly to remove soap.  After that Use CHG Soap as you would any other liquid soap. You can apply CHG directly to the skin and wash gently with a scrungie or  a clean washcloth.   Apply the CHG Soap to your body ONLY FROM THE NECK DOWN.  Do not use on open wounds or open sores. Avoid contact with your eyes, ears, mouth and genitals (private parts). Wash Face and genitals (private parts)  with your normal soap.   Wash thoroughly, paying special attention to the area where your surgery will be performed.  Thoroughly rinse your body with warm water from the neck down.  DO NOT shower/wash with your normal soap after using and rinsing off the CHG Soap.  Pat yourself dry with a CLEAN TOWEL.  Wear CLEAN PAJAMAS to bed the night before surgery  Place CLEAN SHEETS on your bed the night before your surgery  DO NOT  SLEEP WITH PETS.   Day of Surgery:  Take a shower with CHG soap. Wear Clean/Comfortable clothing the morning of surgery Do not apply any deodorants/lotions.   Remember to brush your teeth WITH YOUR REGULAR TOOTHPASTE.    COVID testing  If you are going to stay overnight or be admitted after your procedure/surgery and require a pre-op COVID test, please follow these instructions after your COVID test   You are not required to quarantine however you are required to wear a well-fitting mask when you are out and around people not in your household.  If your mask becomes wet or soiled, replace with a new one.  Wash your hands often with soap and water for 20 seconds or clean your hands with an alcohol-based hand sanitizer that contains at least 60% alcohol.  Do not share personal items.  Notify your provider: if you are in close contact with someone who has COVID  or if you develop a fever of 100.4 or greater, sneezing, cough, sore throat, shortness of breath or body aches.    Please read over the following fact sheets that you were given.

## 2021-04-02 ENCOUNTER — Encounter (HOSPITAL_COMMUNITY)
Admission: RE | Admit: 2021-04-02 | Discharge: 2021-04-02 | Disposition: A | Discharge status: Home / Self Care | Payer: BC Managed Care – PPO | Source: Ambulatory Visit | Attending: Surgery | Admitting: Surgery

## 2021-04-02 ENCOUNTER — Ambulatory Visit (HOSPITAL_COMMUNITY)
Admission: RE | Admit: 2021-04-02 | Discharge: 2021-04-02 | Disposition: A | Discharge status: Home / Self Care | Payer: BC Managed Care – PPO | Source: Ambulatory Visit | Attending: Surgery | Admitting: Surgery

## 2021-04-02 ENCOUNTER — Other Ambulatory Visit: Payer: Self-pay

## 2021-04-02 ENCOUNTER — Encounter (HOSPITAL_COMMUNITY): Payer: Self-pay

## 2021-04-02 VITALS — BP 142/79 | HR 86 | Temp 98.2°F | Resp 18 | Ht 67.0 in | Wt 214.0 lb

## 2021-04-02 DIAGNOSIS — I2510 Atherosclerotic heart disease of native coronary artery without angina pectoris: Secondary | ICD-10-CM

## 2021-04-02 DIAGNOSIS — Z01818 Encounter for other preprocedural examination: Secondary | ICD-10-CM

## 2021-04-02 DIAGNOSIS — I517 Cardiomegaly: Secondary | ICD-10-CM | POA: Diagnosis not present

## 2021-04-02 DIAGNOSIS — U071 COVID-19: Secondary | ICD-10-CM | POA: Diagnosis not present

## 2021-04-02 HISTORY — DX: Family history of other specified conditions: Z84.89

## 2021-04-02 HISTORY — DX: Cerebral infarction, unspecified: I63.9

## 2021-04-02 HISTORY — DX: Angina pectoris, unspecified: I20.9

## 2021-04-02 LAB — URINALYSIS, ROUTINE W REFLEX MICROSCOPIC
Bilirubin Urine: NEGATIVE
Glucose, UA: NEGATIVE mg/dL
Ketones, ur: NEGATIVE mg/dL
Leukocytes,Ua: NEGATIVE
Nitrite: NEGATIVE
Protein, ur: >=300 mg/dL — AB
Specific Gravity, Urine: 1.023 (ref 1.005–1.030)
pH: 5 (ref 5.0–8.0)

## 2021-04-02 LAB — PROTIME-INR
INR: 1 (ref 0.8–1.2)
Prothrombin Time: 13.1 seconds (ref 11.4–15.2)

## 2021-04-02 LAB — TYPE AND SCREEN
ABO/RH(D): O POS
Antibody Screen: NEGATIVE

## 2021-04-02 LAB — BLOOD GAS, ARTERIAL
Acid-Base Excess: 7 mmol/L — ABNORMAL HIGH (ref 0.0–2.0)
Bicarbonate: 31.3 mmol/L — ABNORMAL HIGH (ref 20.0–28.0)
Drawn by: 58793
FIO2: 21 %
O2 Saturation: 97.8 %
Patient temperature: 37
pCO2 arterial: 42 mmHg (ref 32–48)
pH, Arterial: 7.48 — ABNORMAL HIGH (ref 7.35–7.45)
pO2, Arterial: 84 mmHg (ref 83–108)

## 2021-04-02 LAB — CBC
HCT: 43.4 % (ref 39.0–52.0)
Hemoglobin: 14.4 g/dL (ref 13.0–17.0)
MCH: 29.3 pg (ref 26.0–34.0)
MCHC: 33.2 g/dL (ref 30.0–36.0)
MCV: 88.4 fL (ref 80.0–100.0)
Platelets: 254 10*3/uL (ref 150–400)
RBC: 4.91 MIL/uL (ref 4.22–5.81)
RDW: 12.6 % (ref 11.5–15.5)
WBC: 8.9 10*3/uL (ref 4.0–10.5)
nRBC: 0 % (ref 0.0–0.2)

## 2021-04-02 LAB — COMPREHENSIVE METABOLIC PANEL
ALT: 20 U/L (ref 0–44)
AST: 21 U/L (ref 15–41)
Albumin: 3.5 g/dL (ref 3.5–5.0)
Alkaline Phosphatase: 70 U/L (ref 38–126)
Anion gap: 12 (ref 5–15)
BUN: 22 mg/dL — ABNORMAL HIGH (ref 6–20)
CO2: 24 mmol/L (ref 22–32)
Calcium: 9.2 mg/dL (ref 8.9–10.3)
Chloride: 101 mmol/L (ref 98–111)
Creatinine, Ser: 1.61 mg/dL — ABNORMAL HIGH (ref 0.61–1.24)
GFR, Estimated: 49 mL/min — ABNORMAL LOW (ref >60)
Glucose, Bld: 182 mg/dL — ABNORMAL HIGH (ref 70–99)
Potassium: 4.3 mmol/L (ref 3.5–5.1)
Sodium: 137 mmol/L (ref 135–145)
Total Bilirubin: 0.7 mg/dL (ref 0.3–1.2)
Total Protein: 7.6 g/dL (ref 6.5–8.1)

## 2021-04-02 LAB — SURGICAL PCR SCREEN
MRSA, PCR: NEGATIVE
Staphylococcus aureus: NEGATIVE

## 2021-04-02 LAB — GLUCOSE, CAPILLARY: Glucose-Capillary: 174 mg/dL — ABNORMAL HIGH (ref 70–99)

## 2021-04-02 LAB — APTT: aPTT: 27 seconds (ref 24–36)

## 2021-04-02 LAB — SARS CORONAVIRUS 2 (TAT 6-24 HRS): SARS Coronavirus 2: POSITIVE — AB

## 2021-04-02 IMAGING — DX DG CHEST 2V
2 series · 2 of 2 positions shown · non-contrast
Comparison: CT chest [DATE].  Chest x-ray [DATE].

CLINICAL DATA: Preoperative imaging for heart surgery. History of
coronary artery disease.

EXAM:
CHEST - 2 VIEW

[w chest pa]
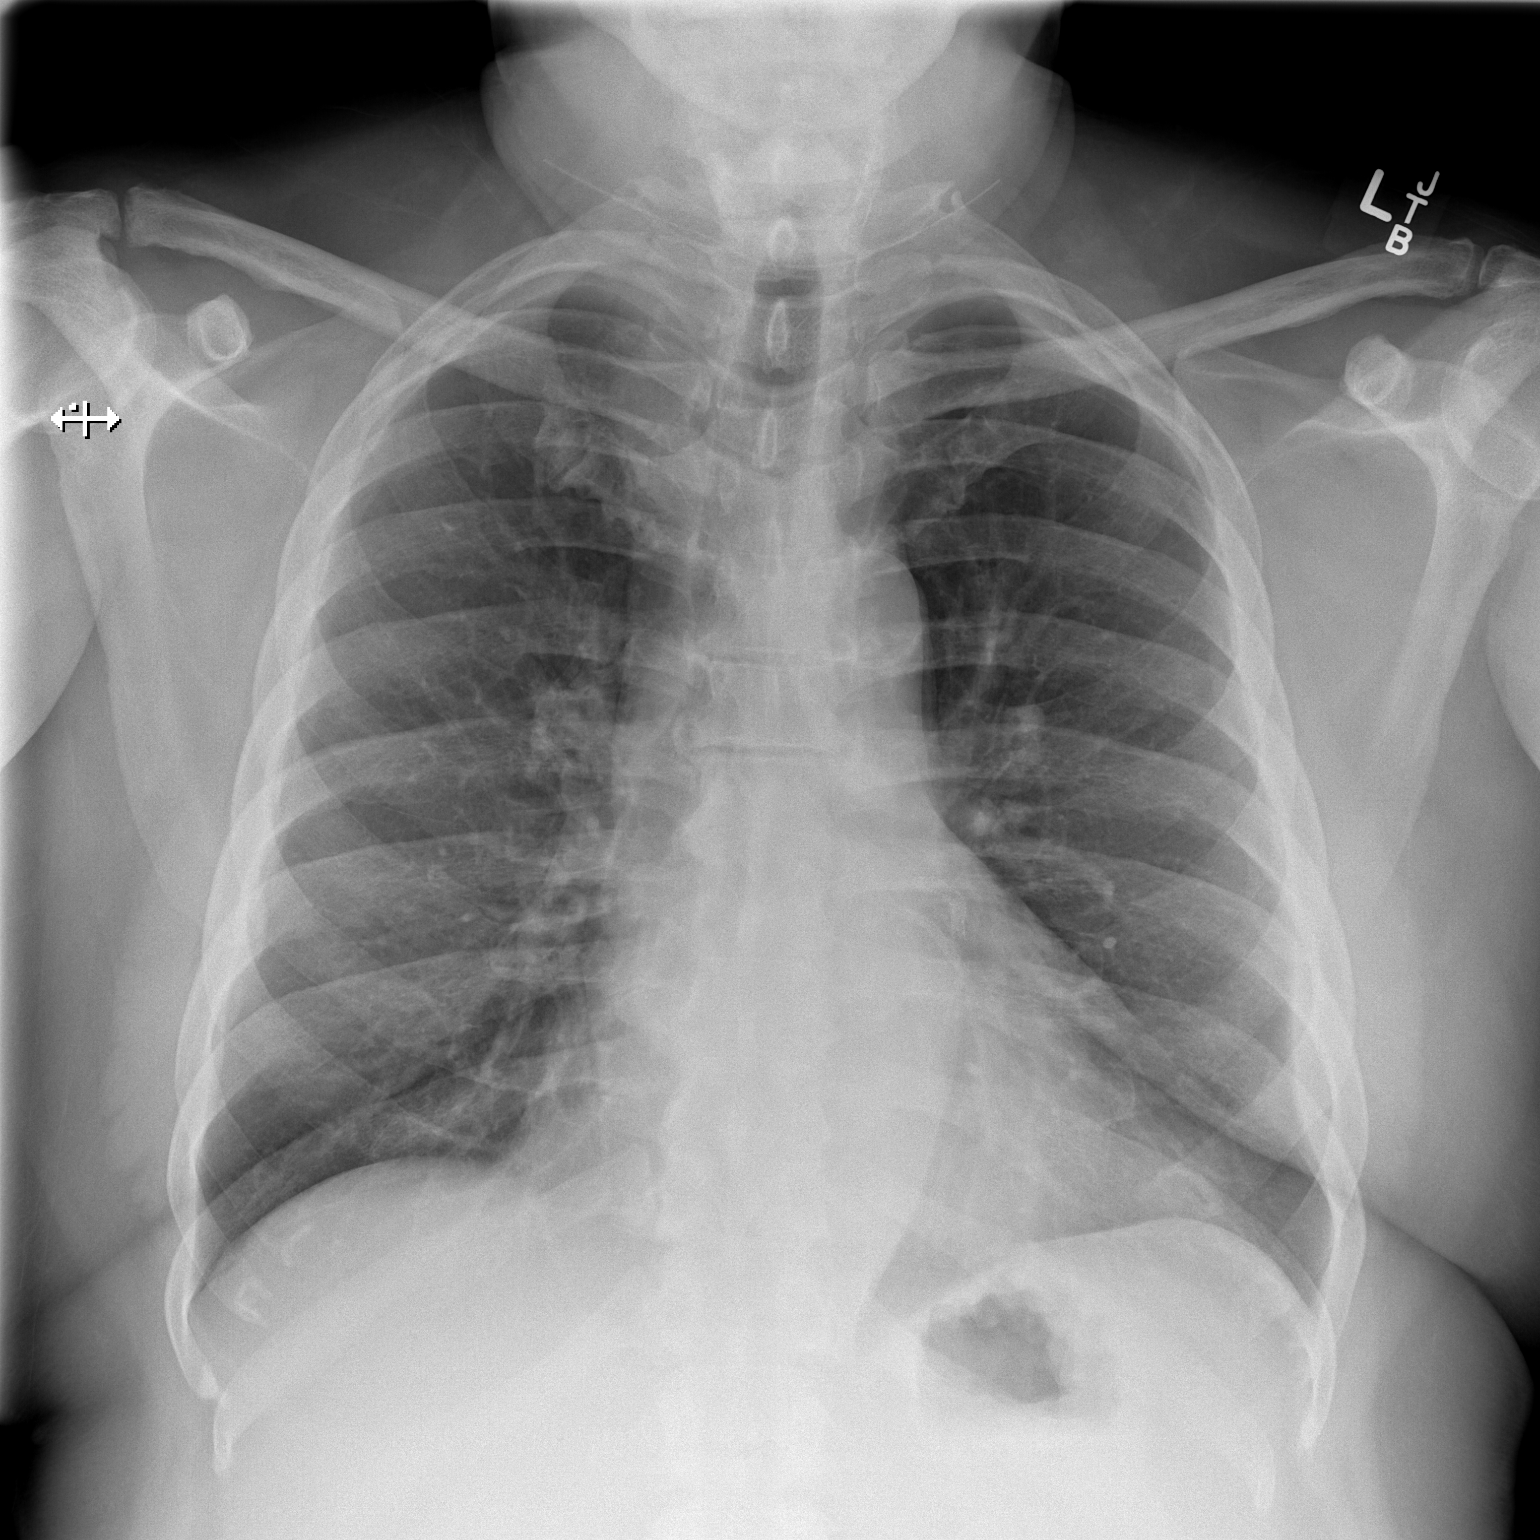

[w chest lat]
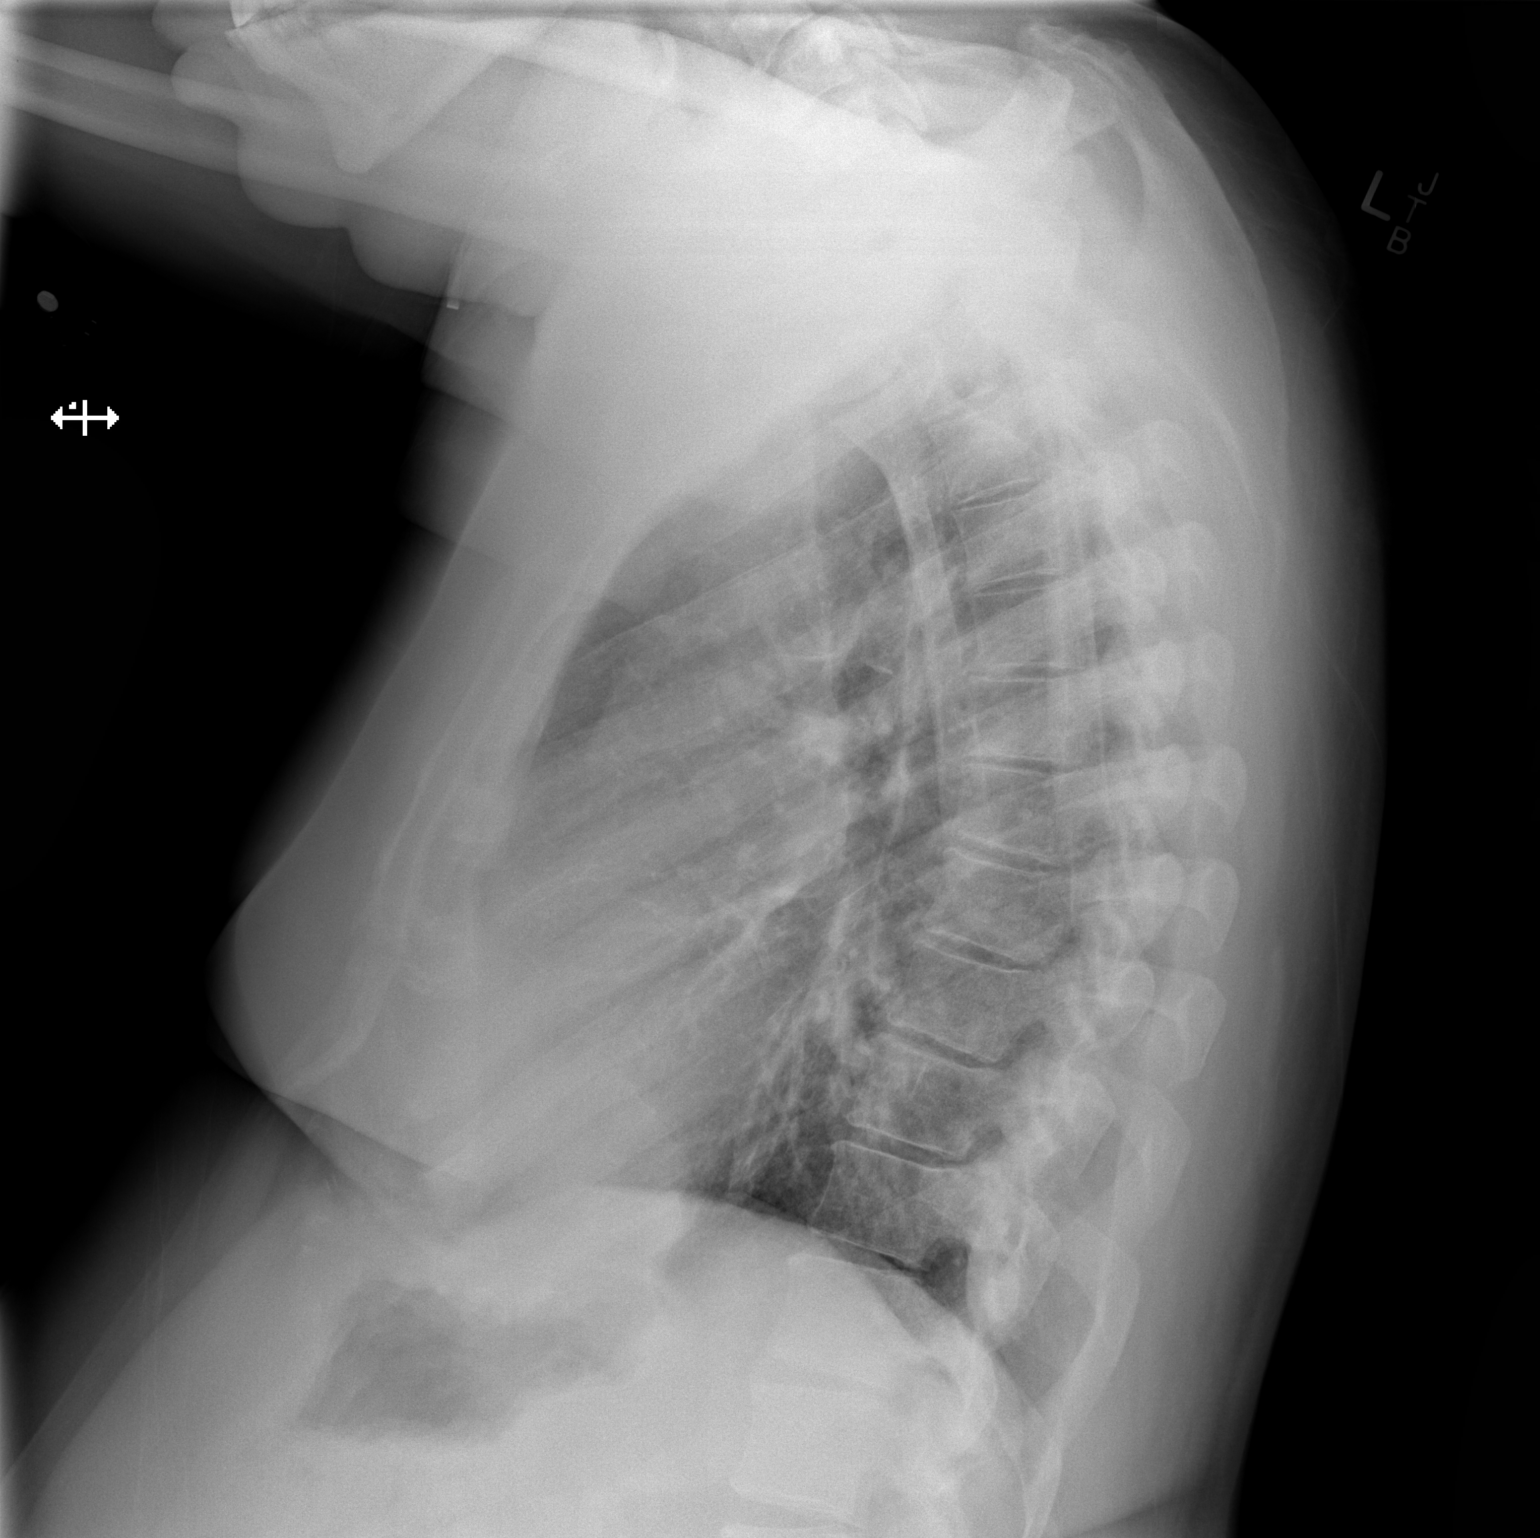

[2 of 2 positions shown; findings below may reference images not displayed]

FINDINGS: Mediastinum hilar structures normal. Borderline cardiomegaly.
Coronary artery calcification. No pulmonary venous congestion. No
focal infiltrate. No pleural effusion or pneumothorax. Degenerative
change thoracic spine.
IMPRESSION: 1. Borderline cardiomegaly. No pulmonary venous congestion. Coronary
artery calcification.

2.  No acute pulmonary disease.

## 2021-04-02 NOTE — Progress Notes (Signed)
Abnormal labs in PAT:  Creatinine 1.61; Urinalysis: Bacteria, UA - rare; Protein, ur - > 300; Hgb urine dipstick - small. Dr. Cyndia Bent office was notified [name], Mark Knack, RN)

## 2021-04-02 NOTE — Progress Notes (Addendum)
PCP - [name], MD Cardiologist - Gwyndolyn Kaufman, MD  PPM/ICD - denies Device Orders - n/a Rep Notified - n/a  Chest x-ray - 04/02/2021 EKG - 03/11/2021 Stress Test - denies ECHO - 12/28/2020 Cardiac Cath - 12/26/2020  Sleep Study - denies CPAP - n/a  Fasting Blood Sugar - 240 - 280 Checks Blood Sugar 2 times a day CBG today - 174 A1C - done in PAT on 04/02/2021  Blood Thinner Instructions: Plavix - last dose - 03/31/2021 per patient  Aspirin Instructions: will hold Aspirin the day of surgery per patient  Patient was instructed: As of today, STOP taking any Aspirin (unless otherwise instructed by your surgeon) Aleve, Naproxen, Ibuprofen, Motrin, Advil, Goody's, BC's, all herbal medications, fish oil, and all vitamins.  ERAS Protcol - n/a  COVID TEST- done in PAT on 04/02/2021   Anesthesia review: yes - cardiac history; recent hospitalization due to a possible TIA  Patient denies shortness of breath, fever, cough and chest pain at PAT appointment   All instructions explained to the patient, with a verbal understanding of the material. Patient agrees to go over the instructions while at home for a better understanding. Patient also instructed to self quarantine after being tested for COVID-19. The opportunity to ask questions was provided.

## 2021-04-03 ENCOUNTER — Encounter: Payer: Self-pay | Admitting: *Deleted

## 2021-04-03 ENCOUNTER — Encounter (HOSPITAL_COMMUNITY): Payer: Self-pay | Admitting: Certified Registered"

## 2021-04-03 ENCOUNTER — Ambulatory Visit: Payer: BC Managed Care – PPO

## 2021-04-03 ENCOUNTER — Other Ambulatory Visit: Payer: Self-pay | Admitting: *Deleted

## 2021-04-03 DIAGNOSIS — I2510 Atherosclerotic heart disease of native coronary artery without angina pectoris: Secondary | ICD-10-CM

## 2021-04-03 LAB — HEMOGLOBIN A1C
Hgb A1c MFr Bld: >15.5 % — ABNORMAL HIGH (ref 4.8–5.6)
Mean Plasma Glucose: >398 mg/dL

## 2021-04-03 NOTE — Progress Notes (Signed)
Ryan Rolena Infante and [employer] notified of covid result.

## 2021-04-09 ENCOUNTER — Encounter (HOSPITAL_COMMUNITY): Payer: Self-pay | Admitting: Radiology

## 2021-04-11 NOTE — Progress Notes (Signed)
Surgical Instructions ? ? ? Your procedure is scheduled on 04/15/21. ? Report to [facility] Main Entrance "A" at 05:30 A.M., then check in with the Admitting office. ? Call this number if you have problems the morning of surgery: ? [phone] ? ? If you have any questions prior to your surgery date call [phone]: Open Monday-Friday 8am-4pm ? ? ? Remember: ? Do not eat or drink after midnight the night before your surgery ? ?  ? Take these medicines the morning of surgery with A SIP OF WATER:  ?amLODipine (NORVASC) ?atorvastatin (LIPITOR ?carvedilol (COREG ?isosorbide mononitrate (IMDUR)  ? ?Please follow your surgeon's instructions for clopidogrel (PLAVIX). If you have not received instructions, please contact your surgeon's office for blood thinner instructions.  ? ?As of today, STOP taking any Aspirin (unless otherwise instructed by your surgeon) Aleve, Naproxen, Ibuprofen, Motrin, Advil, Goody's, BC's, all herbal medications, fish oil, and all vitamins. ? ?WHAT DO I DO ABOUT MY DIABETES MEDICATION? ? ? ?Do not take oral diabetes medicines (pills) the morning of surgery. ? ?THE NIGHT BEFORE SURGERY, take 12 units of insulin glargine-yfgn (SEMGLEE) insulin.     ? ?THE MORNING OF SURGERY, take 12 units of insulin glargine-yfgn (SEMGLEE) insulin. ? ? ?HOW TO MANAGE YOUR DIABETES ?BEFORE AND AFTER SURGERY ? ?Why is it important to control my blood sugar before and after surgery? ?Improving blood sugar levels before and after surgery helps healing and can limit problems. ?A way of improving blood sugar control is eating a healthy diet by: ? Eating less sugar and carbohydrates ? Increasing activity/exercise ? Talking with your doctor about reaching your blood sugar goals ?High blood sugars (greater than 180 mg/dL) can raise your risk of infections and slow your recovery, so you will need to focus on controlling your diabetes during the weeks before surgery. ?Make sure that the doctor who takes care of your diabetes  knows about your planned surgery including the date and location. ? ?How do I manage my blood sugar before surgery? ?Check your blood sugar at least 4 times a day, starting 2 days before surgery, to make sure that the level is not too high or low. ? ?Check your blood sugar the morning of your surgery when you wake up and every 2 hours until you get to the Short Stay unit. ? ?If your blood sugar is less than 70 mg/dL, you will need to treat for low blood sugar: ?Do not take insulin. ?Treat a low blood sugar (less than 70 mg/dL) with ? cup of clear juice (cranberry or apple), 4 glucose tablets, OR glucose gel. ?Recheck blood sugar in 15 minutes after treatment (to make sure it is greater than 70 mg/dL). If your blood sugar is not greater than 70 mg/dL on recheck, call [phone] for further instructions. ?Report your blood sugar to the short stay nurse when you get to Short Stay. ? ?If you are admitted to the hospital after surgery: ?Your blood sugar will be checked by the staff and you will probably be given insulin after surgery (instead of oral diabetes medicines) to make sure you have good blood sugar levels. ?The goal for blood sugar control after surgery is 80-180 mg/dL. ? ?         ?Do not wear jewelry or makeup ?Do not wear lotions, powders, perfumes/colognes, or deodorant. ?Do not shave 48 hours prior to surgery.  Men may shave face and neck. ?Do not bring valuables to the hospital. ?Do not wear nail polish, gel  polish, artificial nails, or any other type of covering on natural nails (fingers and toes) ?If you have artificial nails or gel coating that need to be removed by a nail salon, please have this removed prior to surgery. Artificial nails or gel coating may interfere with anesthesia's ability to adequately monitor your vital signs. ? ?[facility] is not responsible for any belongings or valuables. .  ? ?Do NOT Smoke (Tobacco/Vaping)  24 hours prior to your procedure ? ?If you use a CPAP at night, you  may bring your mask for your overnight stay. ?  ?Contacts, glasses, hearing aids, dentures or partials may not be worn into surgery, please bring cases for these belongings ?  ?For patients admitted to the hospital, discharge time will be determined by your treatment team. ?  ?Patients discharged the day of surgery will not be allowed to drive home, and someone needs to stay with them for 24 hours. ? ?NO VISITORS WILL BE ALLOWED IN PRE-OP WHERE PATIENTS ARE PREPPED FOR SURGERY.  ONLY 1 SUPPORT PERSON MAY BE PRESENT IN THE WAITING ROOM WHILE YOU ARE IN SURGERY.  IF YOU ARE TO BE ADMITTED, ONCE YOU ARE IN YOUR ROOM YOU WILL BE ALLOWED TWO (2) VISITORS. 1 (ONE) VISITOR MAY STAY OVERNIGHT BUT MUST ARRIVE TO THE ROOM BY 8pm.  Minor children may have two parents present. Special consideration for safety and communication needs will be reviewed on a case by case basis. ? ?Special instructions:   ? ?Oral Hygiene is also important to reduce your risk of infection.  Remember - BRUSH YOUR TEETH THE MORNING OF SURGERY WITH YOUR REGULAR TOOTHPASTE ? ? ?[place]- Preparing For Surgery ? ?Before surgery, you can play an important role. Because skin is not sterile, your skin needs to be as free of germs as possible. You can reduce the number of germs on your skin by washing with CHG (chlorahexidine gluconate) Soap before surgery.  CHG is an antiseptic cleaner which kills germs and bonds with the skin to continue killing germs even after washing.   ? ? ?Please do not use if you have an allergy to CHG or antibacterial soaps. If your skin becomes reddened/irritated stop using the CHG.  ?Do not shave (including legs and underarms) for at least 48 hours prior to first CHG shower. It is OK to shave your face. ? ?Please follow these instructions carefully. ?  ? ? Shower the NIGHT BEFORE SURGERY and the MORNING OF SURGERY with CHG Soap.  ? If you chose to wash your hair, wash your hair first as usual with your normal shampoo. After you  shampoo, rinse your hair and body thoroughly to remove the shampoo.  Then [employer] and genitals (private parts) with your normal soap and rinse thoroughly to remove soap. ? ?After that Use CHG Soap as you would any other liquid soap. You can apply CHG directly to the skin and wash gently with a scrungie or a clean washcloth.  ? ?Apply the CHG Soap to your body ONLY FROM THE NECK DOWN.  Do not use on open wounds or open sores. Avoid contact with your eyes, ears, mouth and genitals (private parts). Wash Face and genitals (private parts)  with your normal soap.  ? ?Wash thoroughly, paying special attention to the area where your surgery will be performed. ? ?Thoroughly rinse your body with warm water from the neck down. ? ?DO NOT shower/wash with your normal soap after using and rinsing off the CHG Soap. ? ?  Pat yourself dry with a CLEAN TOWEL. ? ?Wear CLEAN PAJAMAS to bed the night before surgery ? ?Place CLEAN SHEETS on your bed the night before your surgery ? ?DO NOT SLEEP WITH PETS. ? ? ?Day of Surgery: ? ?Take a shower with CHG soap. ?Wear Clean/Comfortable clothing the morning of surgery ?Do not apply any deodorants/lotions.   ?Remember to brush your teeth WITH YOUR REGULAR TOOTHPASTE. ? ? ? ?COVID testing ? ?If you are going to stay overnight or be admitted after your procedure/surgery and require a pre-op COVID test, please follow these instructions after your COVID test  ? ?You are not required to quarantine however you are required to wear a well-fitting mask when you are out and around people not in your household.  If your mask becomes wet or soiled, replace with a new one. ? ?Wash your hands often with soap and water for 20 seconds or clean your hands with an alcohol-based hand sanitizer that contains at least 60% alcohol. ? ?Do not share personal items. ? ?Notify your provider: ?if you are in close contact with someone who has COVID  ?or if you develop a fever of 100.4 or greater, sneezing, cough, sore  throat, shortness of breath or body aches. ? ?  ?Please read over the following fact sheets that you were given.  ? ?

## 2021-04-12 ENCOUNTER — Ambulatory Visit (HOSPITAL_COMMUNITY)
Admission: RE | Admit: 2021-04-12 | Discharge: 2021-04-12 | Disposition: A | Discharge status: Home / Self Care | Payer: BC Managed Care – PPO | Source: Ambulatory Visit | Attending: Surgery | Admitting: Surgery

## 2021-04-12 ENCOUNTER — Emergency Department (HOSPITAL_BASED_OUTPATIENT_CLINIC_OR_DEPARTMENT_OTHER)
Admission: EM | Admit: 2021-04-12 | Discharge: 2021-04-12 | Discharge status: Against Medical Advice | Payer: BC Managed Care – PPO

## 2021-04-12 ENCOUNTER — Encounter (HOSPITAL_COMMUNITY)
Admission: RE | Admit: 2021-04-12 | Discharge: 2021-04-12 | Disposition: A | Discharge status: Home / Self Care | Payer: BC Managed Care – PPO | Source: Ambulatory Visit | Attending: Surgery | Admitting: Surgery

## 2021-04-12 ENCOUNTER — Other Ambulatory Visit: Payer: Self-pay

## 2021-04-12 ENCOUNTER — Encounter (HOSPITAL_COMMUNITY): Payer: Self-pay

## 2021-04-12 DIAGNOSIS — I2510 Atherosclerotic heart disease of native coronary artery without angina pectoris: Secondary | ICD-10-CM | POA: Diagnosis not present

## 2021-04-12 DIAGNOSIS — Z794 Long term (current) use of insulin: Secondary | ICD-10-CM | POA: Diagnosis not present

## 2021-04-12 DIAGNOSIS — Z538 Procedure and treatment not carried out for other reasons: Secondary | ICD-10-CM | POA: Diagnosis not present

## 2021-04-12 DIAGNOSIS — Z01818 Encounter for other preprocedural examination: Secondary | ICD-10-CM | POA: Insufficient documentation

## 2021-04-12 DIAGNOSIS — E1122 Type 2 diabetes mellitus with diabetic chronic kidney disease: Secondary | ICD-10-CM | POA: Diagnosis not present

## 2021-04-12 DIAGNOSIS — Z87891 Personal history of nicotine dependence: Secondary | ICD-10-CM | POA: Diagnosis not present

## 2021-04-12 DIAGNOSIS — N189 Chronic kidney disease, unspecified: Secondary | ICD-10-CM | POA: Diagnosis not present

## 2021-04-12 DIAGNOSIS — I129 Hypertensive chronic kidney disease with stage 1 through stage 4 chronic kidney disease, or unspecified chronic kidney disease: Secondary | ICD-10-CM | POA: Diagnosis not present

## 2021-04-12 DIAGNOSIS — Z8616 Personal history of COVID-19: Secondary | ICD-10-CM | POA: Diagnosis not present

## 2021-04-12 DIAGNOSIS — I252 Old myocardial infarction: Secondary | ICD-10-CM | POA: Diagnosis not present

## 2021-04-12 DIAGNOSIS — Z8673 Personal history of transient ischemic attack (TIA), and cerebral infarction without residual deficits: Secondary | ICD-10-CM | POA: Diagnosis not present

## 2021-04-12 LAB — PROTIME-INR
INR: 0.9 (ref 0.8–1.2)
Prothrombin Time: 12.5 seconds (ref 11.4–15.2)

## 2021-04-12 LAB — URINALYSIS, ROUTINE W REFLEX MICROSCOPIC
Bacteria, UA: NONE SEEN
Bilirubin Urine: NEGATIVE
Glucose, UA: >=500 mg/dL — AB
Hgb urine dipstick: NEGATIVE
Ketones, ur: NEGATIVE mg/dL
Leukocytes,Ua: NEGATIVE
Nitrite: NEGATIVE
Protein, ur: 100 mg/dL — AB
Specific Gravity, Urine: 1.025 (ref 1.005–1.030)
pH: 5 (ref 5.0–8.0)

## 2021-04-12 LAB — TYPE AND SCREEN
ABO/RH(D): O POS
Antibody Screen: NEGATIVE

## 2021-04-12 LAB — COMPREHENSIVE METABOLIC PANEL
ALT: 13 U/L (ref 0–44)
AST: 14 U/L — ABNORMAL LOW (ref 15–41)
Albumin: 4 g/dL (ref 3.5–5.0)
Alkaline Phosphatase: 235 U/L — ABNORMAL HIGH (ref 38–126)
Anion gap: 11 (ref 5–15)
BUN: 24 mg/dL — ABNORMAL HIGH (ref 8–23)
CO2: 26 mmol/L (ref 22–32)
Calcium: 9.9 mg/dL (ref 8.9–10.3)
Chloride: 94 mmol/L — ABNORMAL LOW (ref 98–111)
Creatinine, Ser: 1.83 mg/dL — ABNORMAL HIGH (ref 0.61–1.24)
GFR, Estimated: 41 mL/min — ABNORMAL LOW (ref >60)
Glucose, Bld: 480 mg/dL — ABNORMAL HIGH (ref 70–99)
Potassium: 4.4 mmol/L (ref 3.5–5.1)
Sodium: 131 mmol/L — ABNORMAL LOW (ref 135–145)
Total Bilirubin: 0.6 mg/dL (ref 0.3–1.2)
Total Protein: 7.9 g/dL (ref 6.5–8.1)

## 2021-04-12 LAB — APTT: aPTT: 27 seconds (ref 24–36)

## 2021-04-12 LAB — CBC
HCT: 44.1 % (ref 39.0–52.0)
Hemoglobin: 14.6 g/dL (ref 13.0–17.0)
MCH: 28.3 pg (ref 26.0–34.0)
MCHC: 33.1 g/dL (ref 30.0–36.0)
MCV: 85.6 fL (ref 80.0–100.0)
Platelets: 357 10*3/uL (ref 150–400)
RBC: 5.15 MIL/uL (ref 4.22–5.81)
RDW: 12 % (ref 11.5–15.5)
WBC: 12.5 10*3/uL — ABNORMAL HIGH (ref 4.0–10.5)
nRBC: 0 % (ref 0.0–0.2)

## 2021-04-12 LAB — BLOOD GAS, ARTERIAL
Acid-Base Excess: 2.4 mmol/L — ABNORMAL HIGH (ref 0.0–2.0)
Bicarbonate: 27.2 mmol/L (ref 20.0–28.0)
Drawn by: 53845
O2 Saturation: 97.6 %
Patient temperature: 37
pCO2 arterial: 42 mmHg (ref 32–48)
pH, Arterial: 7.42 (ref 7.35–7.45)
pO2, Arterial: 85 mmHg (ref 83–108)

## 2021-04-12 LAB — HEMOGLOBIN A1C
Hgb A1c MFr Bld: 14.4 % — ABNORMAL HIGH (ref 4.8–5.6)
Mean Plasma Glucose: 366.58 mg/dL

## 2021-04-12 LAB — GLUCOSE, CAPILLARY: Glucose-Capillary: 470 mg/dL — ABNORMAL HIGH (ref 70–99)

## 2021-04-12 IMAGING — DX DG CHEST 2V
2 series · 2 of 2 positions shown · non-contrast
Comparison: [DATE]

CLINICAL DATA: Preoperative evaluation for upcoming coronary bypass
grafting

EXAM:
CHEST - 2 VIEW

[w chest pa]
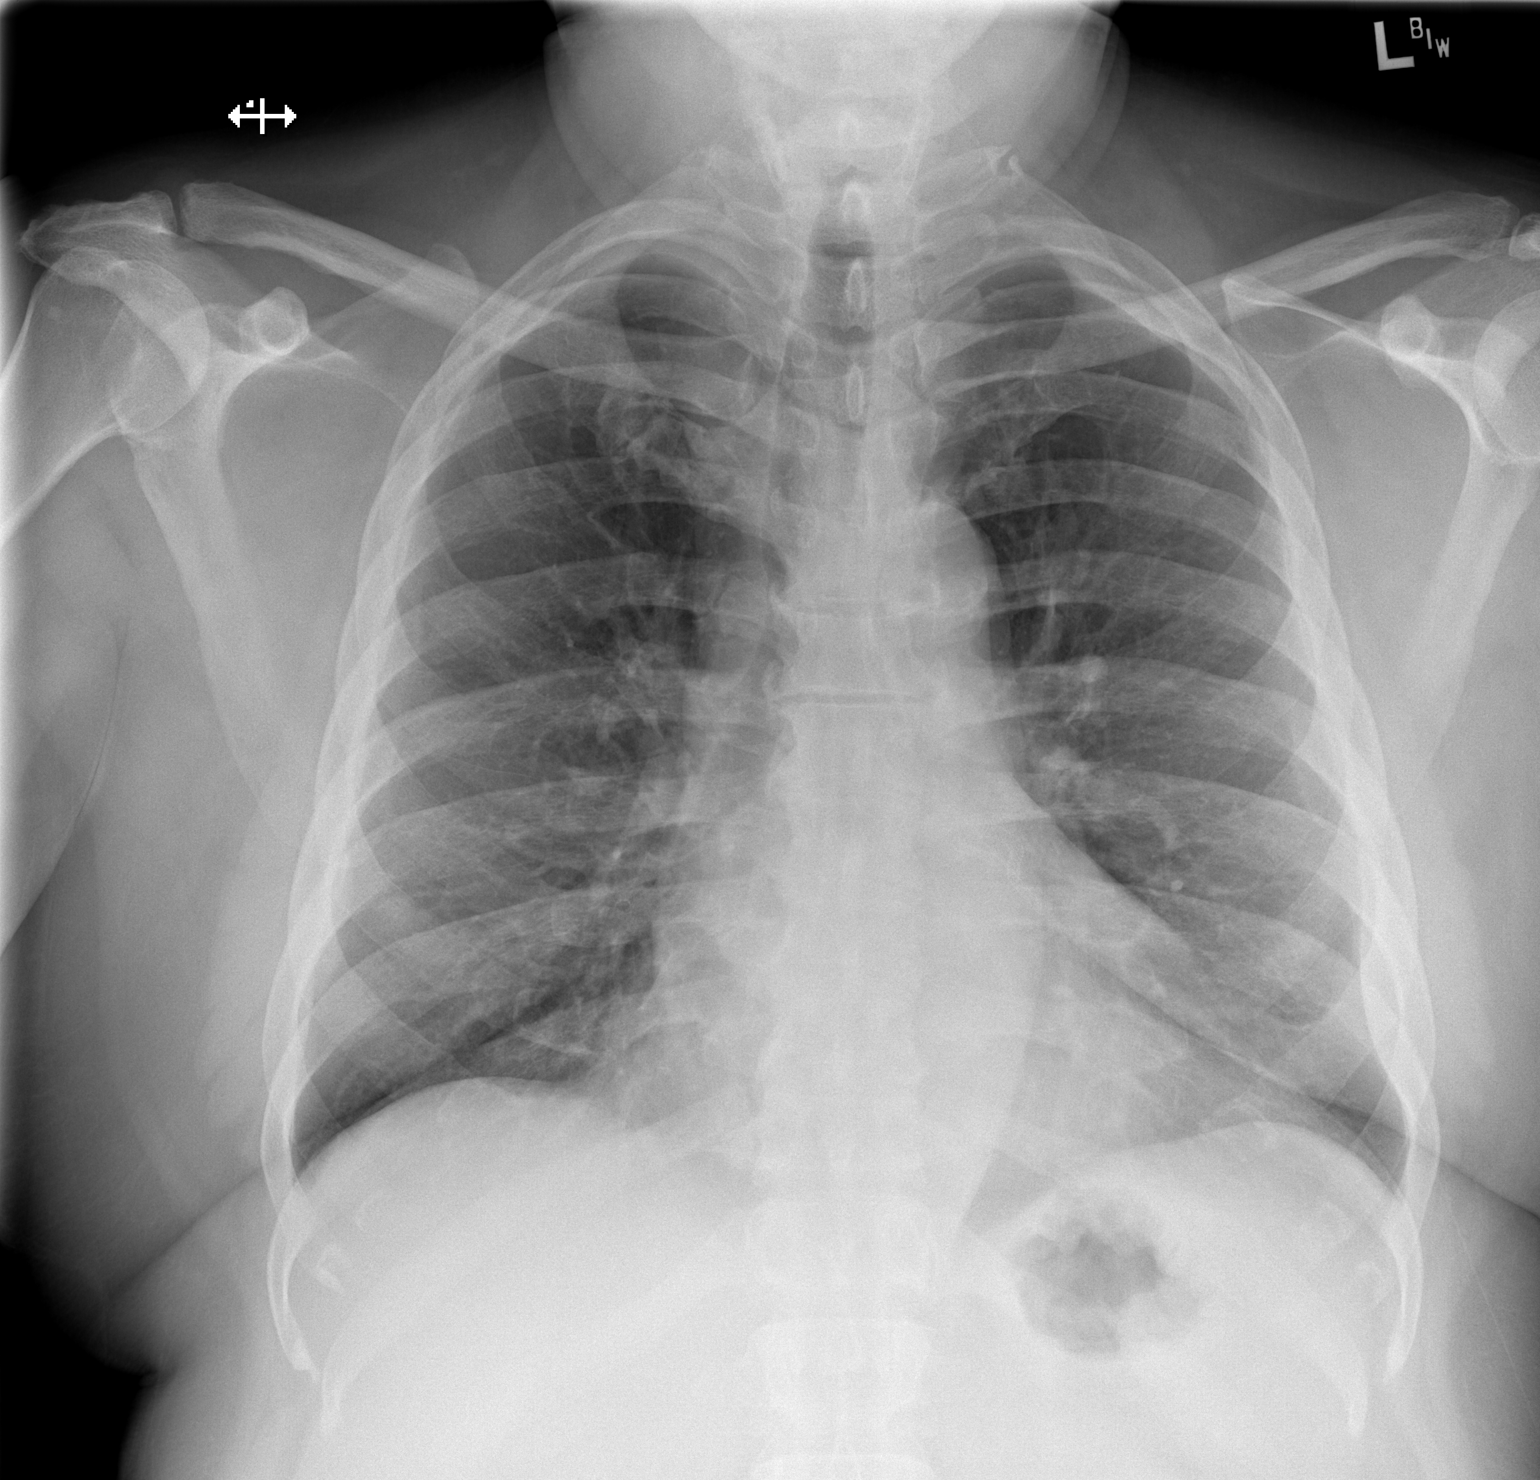

[w chest lat]
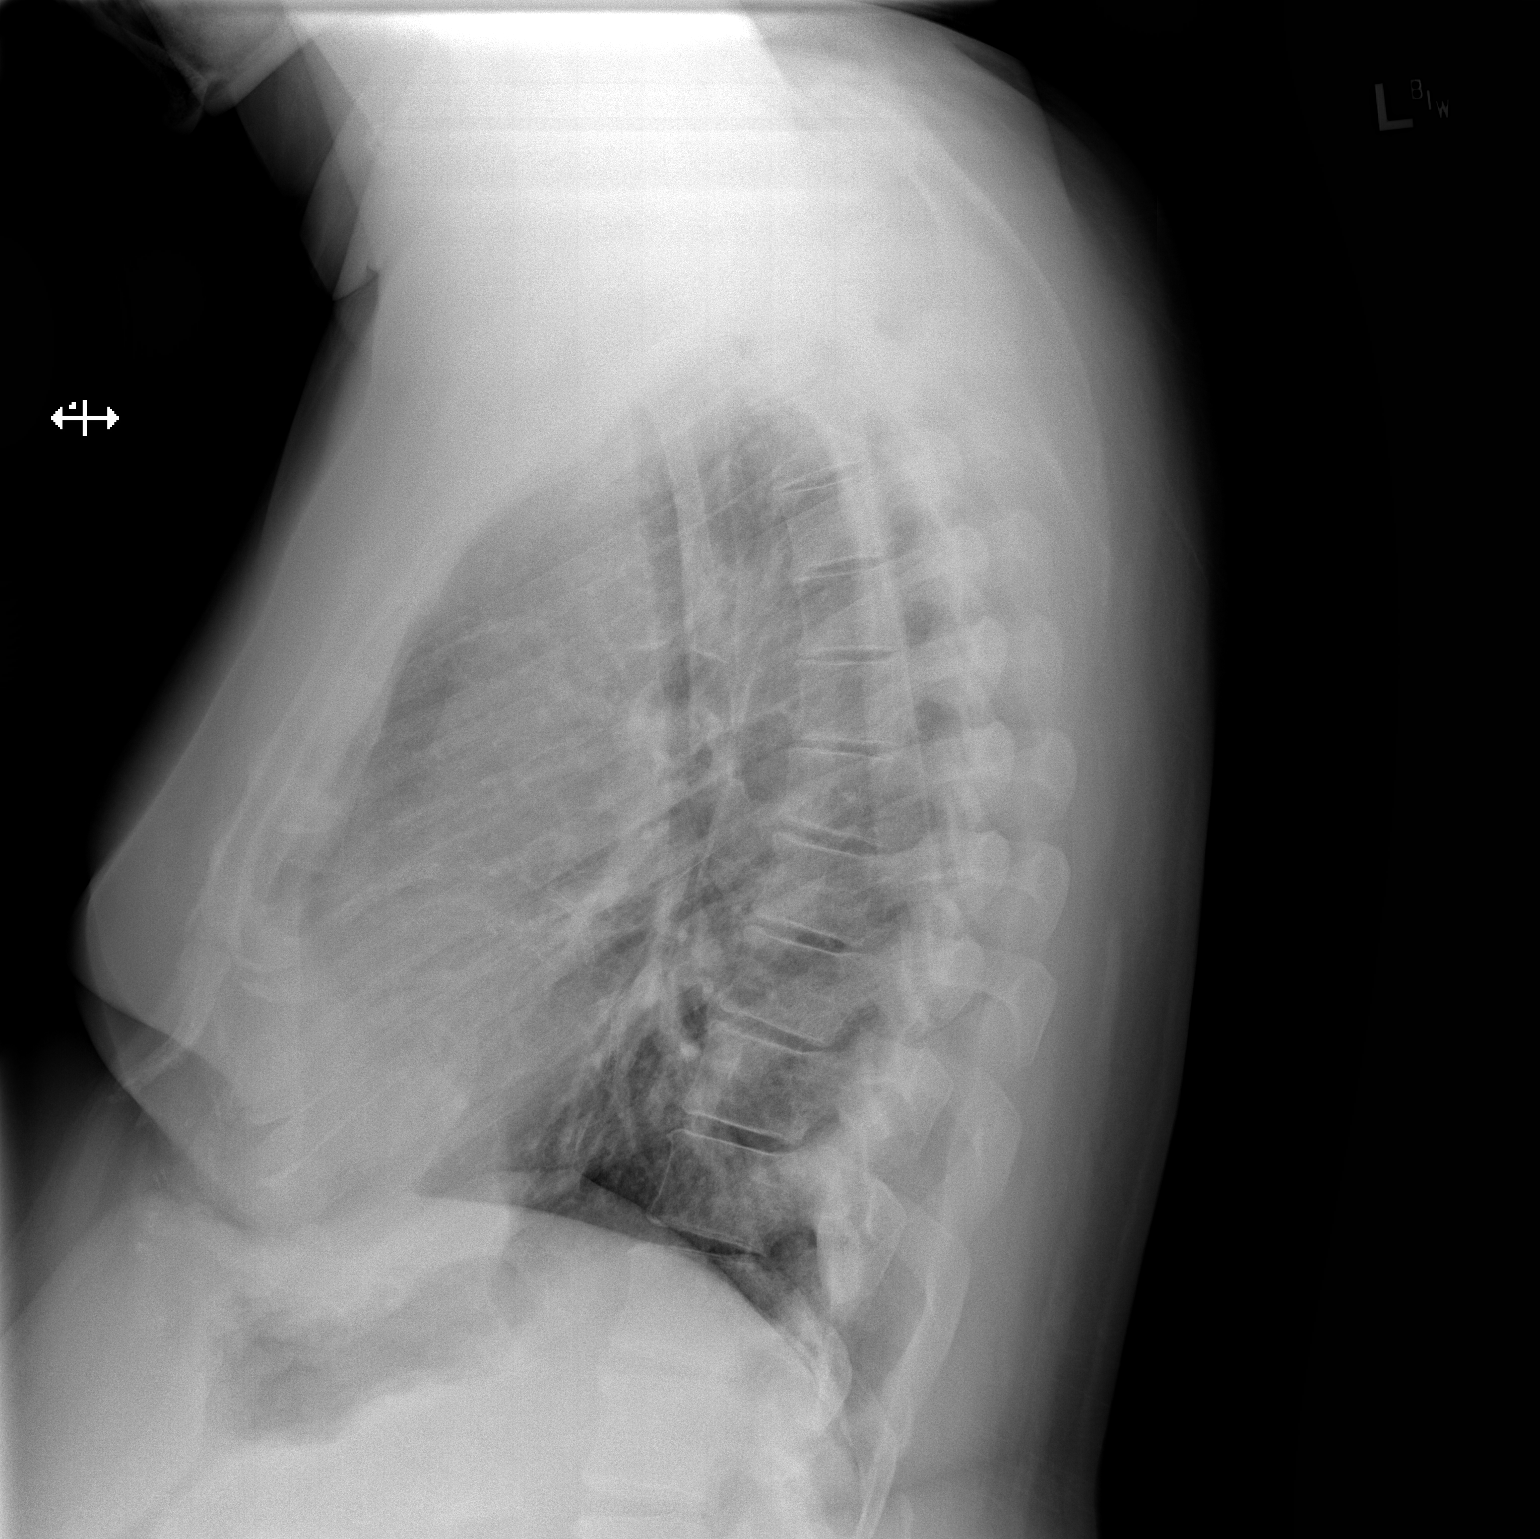

[2 of 2 positions shown; findings below may reference images not displayed]

FINDINGS: Cardiac shadow is stable. Aortic calcifications are noted. The lungs
are clear bilaterally. No focal infiltrate or effusion is seen. No
bony abnormality is noted.
IMPRESSION: No active cardiopulmonary disease.

## 2021-04-12 MED ORDER — TRANEXAMIC ACID 1000 MG/10ML IV SOLN
1.5000 mg/kg/h | INTRAVENOUS | Status: DC
  Filled 2021-04-12: qty 25

## 2021-04-12 MED ORDER — POTASSIUM CHLORIDE 2 MEQ/ML IV SOLN
80.0000 meq | INTRAVENOUS | Status: DC
  Filled 2021-04-12: qty 40

## 2021-04-12 MED ORDER — EPINEPHRINE HCL 5 MG/250ML IV SOLN IN NS
0.0000 ug/min | INTRAVENOUS | Status: DC
  Filled 2021-04-12: qty 250

## 2021-04-12 MED ORDER — CEFAZOLIN SODIUM-DEXTROSE 2-4 GM/100ML-% IV SOLN
2.0000 g | INTRAVENOUS | Status: DC
  Filled 2021-04-12: qty 100

## 2021-04-12 MED ORDER — NOREPINEPHRINE 4 MG/250ML-% IV SOLN
0.0000 ug/min | INTRAVENOUS | Status: DC
  Filled 2021-04-12: qty 250

## 2021-04-12 MED ORDER — TRANEXAMIC ACID (OHS) BOLUS VIA INFUSION
15.0000 mg/kg | INTRAVENOUS | Status: DC
  Filled 2021-04-12: qty 1457

## 2021-04-12 MED ORDER — NITROGLYCERIN IN D5W 200-5 MCG/ML-% IV SOLN
2.0000 ug/min | INTRAVENOUS | Status: DC
  Filled 2021-04-12: qty 250

## 2021-04-12 MED ORDER — DEXMEDETOMIDINE HCL IN NACL 400 MCG/100ML IV SOLN
0.1000 ug/kg/h | INTRAVENOUS | Status: DC
  Filled 2021-04-12: qty 100

## 2021-04-12 MED ORDER — PHENYLEPHRINE HCL-NACL 20-0.9 MG/250ML-% IV SOLN
30.0000 ug/min | INTRAVENOUS | Status: DC
  Filled 2021-04-12: qty 250

## 2021-04-12 MED ORDER — VANCOMYCIN HCL 1500 MG/300ML IV SOLN
1500.0000 mg | INTRAVENOUS | Status: DC
  Filled 2021-04-12: qty 300

## 2021-04-12 MED ORDER — MANNITOL 20 % IV SOLN
INTRAVENOUS | Status: DC
  Filled 2021-04-12: qty 13

## 2021-04-12 MED ORDER — TRANEXAMIC ACID (OHS) PUMP PRIME SOLUTION
2.0000 mg/kg | INTRAVENOUS | Status: DC
  Filled 2021-04-12: qty 1.94

## 2021-04-12 MED ORDER — HEPARIN 30,000 UNITS/1000 ML (OHS) CELLSAVER SOLUTION
Status: DC
  Filled 2021-04-12: qty 1000

## 2021-04-12 MED ORDER — PLASMA-LYTE A IV SOLN
INTRAVENOUS | Status: DC
  Filled 2021-04-12: qty 2.5

## 2021-04-12 MED ORDER — MILRINONE LACTATE IN DEXTROSE 20-5 MG/100ML-% IV SOLN
0.3000 ug/kg/min | INTRAVENOUS | Status: DC
  Filled 2021-04-12: qty 100

## 2021-04-12 MED ORDER — INSULIN REGULAR(HUMAN) IN NACL 100-0.9 UT/100ML-% IV SOLN
INTRAVENOUS | Status: DC
  Filled 2021-04-12: qty 100

## 2021-04-12 NOTE — Progress Notes (Signed)
Inpatient Diabetes Program Recommendations ? ?AACE/ADA: New Consensus Statement on Inpatient Glycemic Control (2015) ? ?Target Ranges:  Prepandial:   less than 140 mg/dL ?     Peak postprandial:   less than 180 mg/dL (1-2 hours) ?     Critically ill patients:  140 - 180 mg/dL  ? ?Lab Results  ?Component Value Date  ? GLUCAP 470 (H) 04/12/2021  ? HGBA1C >15.5 (H) 04/02/2021  ? ? ?Review of Glycemic Control ? ?Diabetes history: DM 2 ?Outpatient Diabetes medications: Semglee 25 units bid ?Current orders for Inpatient glycemic control:  ?In Pre-admission testing for appt ? ?A1c >15% on 04/02/21 ?Glucose trends day of d/c during previous admission: ? Latest Reference Range & Units 03/13/21 08:02 03/13/21 12:02 03/13/21 16:15 03/13/21 19:46 03/13/21 23:28 03/14/21 03:56 03/14/21 08:08  ?Glucose-Capillary 70 - 99 mg/dL 245 (H) 391 (H) 397 (H) 370 (H) 301 (H) 243 (H) 315 (H)  ? ?Pt has been taking semglee 25 units bid consistently but was expensive ? ?Future: ReliOn Novolin N (NPH) 35 units bid flex pens (order # for future [phone]) pt can ask for insulin over the counter without a prescription, when current Semglee runs out ? ?Advised pt to start taking Semglee 35 units bid starting tonight. ? ?Pt has not been checking glucose trends at home due to [facility] placed admission not placed on properly by pt. Gave pt fingerstick glucometer for back up. Also placed another Freestyle Libre sensor on left arm for pt to use until surgery on Monday... Pt is adamant about not sticking fingers for glucose checks. ? ?Discussed importance of glucose control with pt. Told pt to check glucose at least 4 times a day.  ? ?-   Based on glucose trends last admission, it would be difficult to say that the increase fo insulin to 35 units bid would be sufficient glucose control prior to surgery. ? ?-   Recommend early admission for glucose control via IV insulin gtt at least 24 hours prior to surgery. ? ?Pt reports needing to go home for  tonight but says that he would come in when they asked. ? ?Pt does not have a PCP, Pt reports he was never told who to follow up with. Pt will need TOC consult during this coming admission to make sure he has follow up and resources at time of d/c. ? ?Thanks, ? ?Tama Headings RN, MSN, BC-ADM ?Inpatient Diabetes Coordinator ?Team Pager [phone] (8a-5p) ?

## 2021-04-12 NOTE — Discharge Instructions (Signed)
Glucose Products:  ReliOn? glucose products raise low blood sugar fast. Tablets are free of fat, caffeine, sodium and gluten. They are portable and easy to carry, making it easier for people with diabetes to BE PREPARED for lows. ? ?Human Insulin  ?Novolin?/ReliOn? (recombinant DNA origin) is manufactured for [employer] by [employer] ? ?Novolin?/ReliOn? Insulin Pens*  ?????  $42.88 ?Needs N (NPH insulin) 35 units twice a day ? ? ?ReliOn? Pen Needles* ? 50 ct....................................................$9.00 ?Available in 63mm, 90mm, 28mm & 63mm ?

## 2021-04-12 NOTE — Progress Notes (Signed)
Anesthesia Chart Review:  Case: [id number] Date/Time: 04/15/21 0715   Procedures:      CORONARY ARTERY BYPASS GRAFTING (CABG) (Chest)     TRANSESOPHAGEAL ECHOCARDIOGRAM (TEE)   Anesthesia type: General   Pre-op diagnosis: CAD   Location: [facility] OR ROOM 48 / [facility]   Surgeons: [name], MD       DISCUSSION: Patient is a 61 year old male scheduled for the above procedure.  CABG recommended back in November 2022  during admission for NSTEMI; however surgery delayed as he suffered a acute stroke following cardiac catheterization.  Surgery was initially scheduled for 04/04/2021 but his presurgical COVID-19 test was positive.    Surgery was going to be rescheduled for 04/15/2021 but he presented to PAT with nonfasting CBG of 470.  It is unclear how compliant he was being with his insulin regimen as his story was inconsistent.  He ultimately told us his glucometer was broken, but we were able to consult a diabetes Associate Professor.  See progress note by Tama Headings, RN.  His A1c on 04/02/2021 was also greater than 15.5%.  While patient at PAT, I communicated with TCTS PA Myron and with TCTS RN Thurmond Butts, as well as the PAT RN and DM RN Coordinator.  Larene Beach did provide a glucose meter with instruction and and insulin recommendations, but ultimately if surgery was to remain is scheduled for 04/15/2021 then admission before surgery for diabetes management was recommended.  Surgical APP advised he go to ED for consideration of admission, but patient declined. Dr. Cyndia Bent reviewed later and recommended postponing surgery, having patient follow-up with PCP, and CT surgery follow-up ~ 3 weeks.   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 +COVID-19 04/02/21.  04/12/21 CXR is in process.   VS: BP (!)  148/91    Pulse (!) 105    Temp 36.9 C (Oral)    Resp 18    Ht 5' 7"  (1.702 m)    Wt 99 kg    SpO2 98%    BMI 34.19 kg/m   PROVIDERS: [name], MD is PCP Gwyndolyn Kaufman, MD is cardiologist   LABS: Labs noted. Dr. Cyndia Bent is going to postpone surgery.  (all labs ordered are listed, but only abnormal results are displayed)  Labs Reviewed  GLUCOSE, CAPILLARY - Abnormal; Notable for the following components:      Result Value   Glucose-Capillary 470 (*)    All other components within normal limits  CBC - Abnormal; Notable for the following components:   WBC 12.5 (*)    All other components within normal limits  COMPREHENSIVE METABOLIC PANEL - Abnormal; Notable for the following components:   Sodium 131 (*)    Chloride 94 (*)    Glucose, Bld 480 (*)    BUN 24 (*)    Creatinine, Ser 1.83 (*)    AST 14 (*)    Alkaline Phosphatase 235 (*)    GFR, Estimated 41 (*)    All other components within normal limits  URINALYSIS, ROUTINE W REFLEX MICROSCOPIC - Abnormal; Notable for the following components:   Color, Urine STRAW (*)    Glucose, UA >=500 (*)    Protein, ur 100 (*)    All other components within normal limits  BLOOD GAS, ARTERIAL - Abnormal; Notable for the following components:  Acid-Base Excess 2.4 (*)    All other components within normal limits  HEMOGLOBIN A1C - Abnormal; Notable for the following components:   Hgb A1c MFr Bld 14.4 (*)    All other components within normal limits  PROTIME-INR  APTT  TYPE AND SCREEN    OTHER: Overnight EEG 03/12/21: IMPRESSION: This study is within normal limits. No seizures or epileptiform discharges were seen throughout the recording.  Spirometry 12/27/20: FVC 1.50 (41%), FEV1 1.23 (43%), FEV1/FVC 82% (104%)   IMAGES: CXR 04/13/19: In process.   EKG: 04/12/21: Normal sinus rhythm Possible Left atrial enlargement Septal infarct , age undetermined T wave abnormality, consider inferolateral ischemia Abnormal  ECG   CV: Limited echo bubble study 12/28/20: IMPRESSIONS   1. Agitated saline contrast bubble study was negative, with no evidence  of any interatrial shunt.    US Carotid 12/28/20: Summary:  Right Carotid: Velocities in the right ICA are consistent with a 1-39%  stenosis.   Left Carotid: Velocities in the left ICA are consistent with a 1-39%  stenosis.  Vertebrals: Right vertebral artery demonstrates antegrade flow. Left  vertebral              artery was not visualized.    Cardiac cath 12/26/20:   Prox Cx lesion is 60% stenosed.   Prox Cx to Mid Cx lesion is 90% stenosed.   1st Mrg-2 lesion is 80% stenosed.   Mid LAD lesion is 50% stenosed.   Mid LAD to Dist LAD lesion is 80% stenosed.   2nd Diag lesion is 80% stenosed.   RPAV lesion is 50% stenosed.   RV Branch-1 lesion is 70% stenosed.   RV Branch-2 lesion is 80% stenosed.   Prox RCA-1 lesion is 90% stenosed.   Prox RCA to Mid RCA lesion is 95% stenosed.   Mid RCA lesion is 20% stenosed.   Prox RCA-2 lesion is 65% stenosed.   Dist RCA lesion is 20% stenosed.   1st Diag lesion is 90% stenosed.   Prox LAD lesion is 30% stenosed.   Previously placed Mid Cx to Dist Cx stent (unknown type) is  widely patent.   Previously placed 1st Mrg-1 stent (unknown type) is  widely patent.   LV end diastolic pressure is severely elevated.   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  In this diabetic male with significant diffuse disease in the LAD, left circumflex, and right coronary artery recommend surgical evaluation for consideration of CABG revascularization.   RECOMMENDATION: Surgical consultation for consideration of CABG...   Echo 12/24/20: IMPRESSIONS   1. Left ventricular ejection fraction, by estimation, is 55 to 60%. The  left ventricle has normal function. The left ventricle demonstrates  regional  wall motion abnormalities (see scoring diagram/findings for  description). Left ventricular diastolic  parameters are consistent with Grade I diastolic dysfunction (impaired  relaxation).   2. Right ventricular systolic function is normal. The right ventricular  size is normal.   3. The mitral valve is normal in structure. Mild mitral valve  regurgitation. No evidence of mitral stenosis.   4. The aortic valve is normal in structure. Aortic valve regurgitation is  not visualized. Aortic valve sclerosis is present, with no evidence of  aortic valve stenosis.   5. The inferior vena cava is normal in size with greater than 50%  respiratory variability, suggesting right atrial pressure of 3 mmHg.   Past Medical  History:  Diagnosis Date   AKI (acute kidney injury) ([place])    pt unaware of this   Anginal pain ([place])    CAD (coronary artery disease)    a. 03/2015 NSTEMI: LHC with severe 3V CAD  (70% mid RCA, 95% OM1, 90% distal LCx, 90% OM3, 80% prox LAD and 90% ost D1) s/p DES to mLAD w/ small dissction Rx with DES, staged ost Ramus PCI/DES and dLCx s/p PCI/DES    Chest pain 12/24/2020   Diabetes mellitus type 2 in obese [facility])    Diverticulosis    Dyspnea on exertion 03/16/2015   Dyspnea on exertion   Family history of adverse reaction to anesthesia    patient father- pt states after anesthesia his father "developed dementia"   Hypercholesteremia    Hypertension associated with diabetes ([place]) 03/16/2015   hypertension   NSTEMI (non-ST elevated myocardial infarction) ([place]) 03/17/2015   Obesity    Stroke ([place]) 2022   pt states he had a "mini stroke" during cardiac catheterization   Tobacco abuse     Past Surgical History:  Procedure Laterality Date   CARDIAC CATHETERIZATION N/A 03/19/2015   Procedure: Left Heart Cath and Coronary Angiography;  Surgeon: Lorretta Harp, MD;  Location: [place] CV LAB;  Service: Cardiovascular;  Laterality: N/A;   CARDIAC CATHETERIZATION N/A 03/20/2015    Procedure: Coronary Stent Intervention;  Surgeon: Lorretta Harp, MD;  Location: [place] CV LAB;  Service: Cardiovascular;  Laterality: N/A;   CARDIAC CATHETERIZATION N/A 03/22/2015   Procedure: Coronary Stent Intervention;  Surgeon: Lorretta Harp, MD;  Location: [place] [facility] LAB;  Service: Cardiovascular;  Laterality: N/A;   LEFT HEART CATH AND CORONARY ANGIOGRAPHY N/A 12/25/2020   Procedure: LEFT HEART CATH AND CORONARY ANGIOGRAPHY;  Surgeon: Troy Sine, MD;  Location: [place] [facility] LAB;  Service: Cardiovascular;  Laterality: N/A;   LEFT HEART CATH AND CORONARY ANGIOGRAPHY N/A 12/26/2020   Procedure: LEFT HEART CATH AND CORONARY ANGIOGRAPHY;  Surgeon: Troy Sine, MD;  Location: [place] [facility] LAB;  Service: Cardiovascular;  Laterality: N/A;   [place]   pt states testicle was ascended and had to be "pulled down"   TONSILLECTOMY     as a child    MEDICATIONS:  amLODipine (NORVASC) 10 MG tablet   aspirin EC 325 MG EC tablet   atorvastatin (LIPITOR) 80 MG tablet   blood glucose meter kit and supplies   carvedilol (COREG) 6.25 MG tablet   clopidogrel (PLAVIX) 75 MG tablet   furosemide (LASIX) 20 MG tablet   insulin glargine-yfgn (SEMGLEE) 100 UNIT/ML injection   isosorbide mononitrate (IMDUR) 60 MG 24 hr tablet   nitroGLYCERIN (NITROSTAT) 0.4 MG SL tablet   No current facility-administered medications for this encounter.    [START ON 04/15/2021] ceFAZolin (ANCEF) IVPB 2g/100 mL premix   [START ON 04/15/2021] ceFAZolin (ANCEF) IVPB 2g/100 mL premix   [START ON 04/15/2021] dexmedetomidine (PRECEDEX) 400 MCG/100ML (4 mcg/mL) infusion   [START ON 04/15/2021] EPINEPHrine (ADRENALIN) 5 mg in NS 250 mL (0.02 mg/mL) premix infusion   [START ON 04/15/2021] heparin 30,000 units/NS 1000 mL solution for CELLSAVER   [START ON 04/15/2021] heparin sodium (porcine) 2,500 Units, papaverine 30 mg in electrolyte-A (PLASMALYTE-A PH 7.4) 500 mL irrigation   [START ON 04/15/2021]  insulin regular, human (MYXREDLIN) 100 units/ 100 mL infusion   [START ON 04/15/2021] [place] Blood Cardioplegia vial (lidocaine/magnesium/mannitol 0.26g-4g-6.4g)   [START ON 04/15/2021] milrinone (PRIMACOR) 20 MG/100 ML (0.2 mg/mL)  infusion   [START ON 04/15/2021] nitroGLYCERIN 50 mg in dextrose 5 % 250 mL (0.2 mg/mL) infusion   [START ON 04/15/2021] norepinephrine (LEVOPHED) 16m in 2551m(0.016 mg/mL) premix infusion   [START ON 04/15/2021] phenylephrine (NEO-SYNEPHRINE) 2036mS 250m98memix infusion   [START ON 04/15/2021] potassium chloride injection 80 mEq   [START ON 04/15/2021] tranexamic acid (CYKLOKAPRON) 2,500 mg in sodium chloride 0.9 % 250 mL (10 mg/mL) infusion   [START ON 04/15/2021] tranexamic acid (CYKLOKAPRON) bolus via infusion - over 30 minutes 1,456.5 mg   [START ON 04/15/2021] tranexamic acid (CYKLOKAPRON) pump prime solution 194 mg   [START ON 04/15/2021] vancomycin (VANCOREADY) IVPB 1500 mg/300 mL    AlliMyra Gianotti-C Surgical Short Stay/Anesthesiology [facility] ([phone] [facility] ([phone]/2023 6:23 PM

## 2021-04-12 NOTE — ED Notes (Signed)
Pt request MEDS to be transferred to a 24 hour pharmacy, without being seen by EDP or having work up due to being seen today by care team. Pt made aware unable to change unless evaluated by EDP. Pt decided to leave and get meds at Pharmacy in the morning. Pt informed it is recommended he stay and be evaluated. Pt declined.  ?

## 2021-04-12 NOTE — Progress Notes (Signed)
Pt had a CBG of 470 upon checking at PAT appt. Last A1C was 04/02/21 and it was >15.5. Ebony Hail, Anesthesia PA notified as well as Levonne Spiller, RN. Ryan informed us that Dr. Cyndia Bent was unavailable but she would speak to him as soon as she could. Diabetes coordinator came to speak with pt because he said his glucometer was broken so he couldn't check his CBG at home. She gave him a meter and taught pt how to use the sensors. It was recommended that pt be admitted to get his sugars under control prior to surgery on Monday. Pt said he was unable to stay today due to obligations at home. Pt informed that Dr. Vivi Martens office will contact him, hopefully for a direct admission over the weekend prior to his surgery on Monday.  ?

## 2021-04-12 NOTE — Progress Notes (Signed)
PCP - denies ?Cardiologist - Dr. Gwyndolyn Kaufman ? ?PPM/ICD - denies ? ? ?Chest x-ray - 04/12/21 at PAT ?EKG - 04/12/21 at PAT ?Stress Test - denies ?ECHO - 12/24/20 ?Cardiac Cath - 12/26/20 ? ?Sleep Study - denies ? ?DM- Type 2 ?Fasting Blood Sugar - 300's ?Checks Blood Sugar 2-3 times a day ? ?ASA/Blood Thinner Instructions: pt stopped ASA and Plavix 3/1 per order, per pt ? ? ?ERAS Protcol - no, NPO ? ? ?COVID TEST- n/a, pt tested positive for COVID on 04/02/21 ? ? ?Anesthesia review: yes, cardiac hx and CBG of 470 at PAT. Ebony Hail is aware of this pt ? ?Patient denies shortness of breath, fever, cough and chest pain at PAT appointment ? ? ?All instructions explained to the patient, with a verbal understanding of the material. Patient agrees to go over the instructions while at home for a better understanding. Patient also instructed to wear a mask in public for 3 days prior to surgery. The opportunity to ask questions was provided. ?  ?

## 2021-04-15 ENCOUNTER — Ambulatory Visit (HOSPITAL_COMMUNITY)
Admission: RE | Admit: 2021-04-15 | Discharge: 2021-04-15 | Disposition: A | Discharge status: Home / Self Care | Payer: BC Managed Care – PPO | Attending: Surgery | Admitting: Surgery

## 2021-04-15 ENCOUNTER — Telehealth: Payer: Self-pay | Admitting: *Deleted

## 2021-04-15 ENCOUNTER — Encounter (HOSPITAL_COMMUNITY)
Admission: RE | Disposition: A | Discharge status: Home / Self Care | Payer: Self-pay | Source: Home / Self Care | Attending: Surgery

## 2021-04-15 DIAGNOSIS — Z8673 Personal history of transient ischemic attack (TIA), and cerebral infarction without residual deficits: Secondary | ICD-10-CM | POA: Insufficient documentation

## 2021-04-15 DIAGNOSIS — Z8616 Personal history of COVID-19: Secondary | ICD-10-CM | POA: Insufficient documentation

## 2021-04-15 DIAGNOSIS — E1122 Type 2 diabetes mellitus with diabetic chronic kidney disease: Secondary | ICD-10-CM | POA: Insufficient documentation

## 2021-04-15 DIAGNOSIS — I2510 Atherosclerotic heart disease of native coronary artery without angina pectoris: Secondary | ICD-10-CM | POA: Insufficient documentation

## 2021-04-15 DIAGNOSIS — I252 Old myocardial infarction: Secondary | ICD-10-CM | POA: Insufficient documentation

## 2021-04-15 DIAGNOSIS — Z87891 Personal history of nicotine dependence: Secondary | ICD-10-CM | POA: Insufficient documentation

## 2021-04-15 DIAGNOSIS — N189 Chronic kidney disease, unspecified: Secondary | ICD-10-CM | POA: Insufficient documentation

## 2021-04-15 DIAGNOSIS — Z538 Procedure and treatment not carried out for other reasons: Secondary | ICD-10-CM | POA: Insufficient documentation

## 2021-04-15 DIAGNOSIS — I129 Hypertensive chronic kidney disease with stage 1 through stage 4 chronic kidney disease, or unspecified chronic kidney disease: Secondary | ICD-10-CM | POA: Insufficient documentation

## 2021-04-15 DIAGNOSIS — Z794 Long term (current) use of insulin: Secondary | ICD-10-CM | POA: Insufficient documentation

## 2021-04-15 SURGERY — CORONARY ARTERY BYPASS GRAFTING (CABG)
Anesthesia: General | Site: Chest

## 2021-04-15 NOTE — Telephone Encounter (Signed)
Called patient to follow up regarding cancelled surgery scheduled for today and to schedule outpatient appt with Dr. Cyndia Bent. Patient upset he was not aware of cancelled case due to missing provider phone call over the weekend. Apologized to patient regarding missed communication. Advised patient to call PCP for diabetes management prior to rescheduling surgery. Patient provided PCP telephone number. Attempted to give patient follow-up appt time, however patient stated he can not wait until April for appt and will call back once seen by PCP.  ?

## 2021-04-16 ENCOUNTER — Encounter: Payer: Self-pay | Admitting: Nurse Practitioner

## 2021-04-16 ENCOUNTER — Other Ambulatory Visit: Payer: Self-pay

## 2021-04-16 ENCOUNTER — Ambulatory Visit (INDEPENDENT_AMBULATORY_CARE_PROVIDER_SITE_OTHER): Payer: BC Managed Care – PPO | Admitting: Nurse Practitioner

## 2021-04-16 VITALS — BP 129/82 | HR 80 | Resp 18 | Ht 67.0 in | Wt 218.0 lb

## 2021-04-16 DIAGNOSIS — E1169 Type 2 diabetes mellitus with other specified complication: Secondary | ICD-10-CM | POA: Diagnosis not present

## 2021-04-16 DIAGNOSIS — E1149 Type 2 diabetes mellitus with other diabetic neurological complication: Secondary | ICD-10-CM | POA: Diagnosis not present

## 2021-04-16 DIAGNOSIS — Z794 Long term (current) use of insulin: Secondary | ICD-10-CM | POA: Diagnosis not present

## 2021-04-16 NOTE — Progress Notes (Signed)
@Patient  ID: [name], male    DOB: 02-20-1960, 61 y.o.   MRN: [id number]  Chief Complaint  Patient presents with   Diabetes    Referring provider: [name], MD  HPI  Patient presents today for follow-up on diabetes.  Patient was scheduled to have a coronary artery bypass graft but it was noted on preop exam that patient's blood sugar was elevated over 400.  The procedure was canceled and patient was advised to follow-up with his PCP.  Patient's last appointment with his PCP was in January and patient was advised at that time to follow-up with [facility] and pharmacy for diabetic medication management.  Patient never went to this appointment.  It is unclear if the appointment was ever scheduled.  Patient was previously on Semglee 25 units twice daily.  Patient states that at his preop visit he was advised to start OTC ReliOn insulin at 35 units twice daily. This was due to patient not being able to afford Semglee. Patient's blood sugar today that was checked on his meter was 290.  Patient has been taking OTC ReliOn as advised by cardiology.  We discussed that he can increase his dosage of insulin by 2 units in the morning and 2 units in the afternoon.  Patient will need to follow-up later this week to see how his blood sugars are ranging.  Patient does still need to meet with Lurena Joiner and the pharmacy.  We discussed that we can place a referral to endocrinology for diabetic management and for diabetic education. Denies f/c/s, n/v/d, hemoptysis, PND, chest pain or edema.     Allergies  Allergen Reactions   Aspirin Other (See Comments)    Upset stomach  Pt states he is not allergic to aspirin     There is no immunization history on file for this patient.  Past Medical History:  Diagnosis Date   AKI (acute kidney injury) ([place])    pt unaware of this   Anginal pain ([place])    CAD (coronary artery disease)    a. 03/2015 NSTEMI: LHC with severe 3V CAD  (70% mid RCA, 95% OM1, 90% distal LCx,  90% OM3, 80% prox LAD and 90% ost D1) s/p DES to mLAD w/ small dissction Rx with DES, staged ost Ramus PCI/DES and dLCx s/p PCI/DES    Chest pain 12/24/2020   Diabetes mellitus type 2 in obese [facility])    Diverticulosis    Dyspnea on exertion 03/16/2015   Dyspnea on exertion   Family history of adverse reaction to anesthesia    patient father- pt states after anesthesia his father "developed dementia"   Hypercholesteremia    Hypertension associated with diabetes ([place]) 03/16/2015   hypertension   NSTEMI (non-ST elevated myocardial infarction) ([place]) 03/17/2015   Obesity    Stroke ([place]) 2022   pt states he had a "mini stroke" during cardiac catheterization   Tobacco abuse     Tobacco History: Social History   Tobacco Use  Smoking Status Former   Packs/day: 0.50   Types: Cigarettes   Quit date: 2017   Years since quitting: 6.1  Smokeless Tobacco Never   Counseling given: Not Answered   Outpatient Encounter Medications as of 04/16/2021  Medication Sig   amLODipine (NORVASC) 10 MG tablet Take 1 tablet (10 mg total) by mouth daily.   aspirin EC 325 MG EC tablet Take 1 tablet (325 mg total) by mouth daily.   atorvastatin (LIPITOR) 80 MG tablet Take 1 tablet (80 mg total)  by mouth daily.   blood glucose meter kit and supplies Dispense based on patient and insurance preference. Use up to four times daily as directed. (FOR ICD-10 E10.9, E11.9).   carvedilol (COREG) 6.25 MG tablet Take 3 tablets (18.75 mg total) by mouth 2 (two) times daily with a meal.   clopidogrel (PLAVIX) 75 MG tablet Take 1 tablet (75 mg total) by mouth daily.   furosemide (LASIX) 20 MG tablet Take 1 tablet (20 mg total) by mouth daily as needed for edema. (Patient not taking: Reported on 04/02/2021)   insulin glargine-yfgn (SEMGLEE) 100 UNIT/ML injection Inject 0.25 mLs (25 Units total) into the skin 2 (two) times daily.   isosorbide mononitrate (IMDUR) 60 MG 24 hr tablet Take 1.5 tablets (90 mg total) by mouth daily.    nitroGLYCERIN (NITROSTAT) 0.4 MG SL tablet Place 1 tablet (0.4 mg total) under the tongue every 5 (five) minutes as needed for chest pain (CP or SOB).   No facility-administered encounter medications on file as of 04/16/2021.     Review of Systems  Review of Systems  Constitutional: Negative.   HENT: Negative.    Cardiovascular: Negative.   Gastrointestinal: Negative.   Allergic/Immunologic: Negative.   Neurological: Negative.   Psychiatric/Behavioral: Negative.        Physical Exam  BP 129/82    Pulse 80    Resp 18    Ht 5' 7"  (1.702 m)    Wt 218 lb (98.9 kg)    SpO2 94%    BMI 34.14 kg/m   Wt Readings from Last 5 Encounters:  04/16/21 218 lb (98.9 kg)  04/12/21 218 lb 4.8 oz (99 kg)  04/02/21 214 lb (97.1 kg)  03/25/21 219 lb (99.3 kg)  03/14/21 217 lb 6 oz (98.6 kg)     Physical Exam Vitals and nursing note reviewed.  Constitutional:      General: He is not in acute distress.    Appearance: He is well-developed.  Cardiovascular:     Rate and Rhythm: Normal rate and regular rhythm.  Pulmonary:     Effort: Pulmonary effort is normal.     Breath sounds: Normal breath sounds.  Skin:    General: Skin is warm and dry.  Neurological:     Mental Status: He is alert and oriented to person, place, and time.  Psychiatric:        Mood and Affect: Mood normal.        Behavior: Behavior normal.     Lab Results:  CBC    Component Value Date/Time   WBC 12.5 (H) 04/12/2021 1500   RBC 5.15 04/12/2021 1500   HGB 14.6 04/12/2021 1500   HGB 15.4 02/13/2021 0855   HCT 44.1 04/12/2021 1500   HCT 45.7 02/13/2021 0855   PLT 357 04/12/2021 1500   PLT 239 02/13/2021 0855   MCV 85.6 04/12/2021 1500   MCV 88 02/13/2021 0855   MCH 28.3 04/12/2021 1500   MCHC 33.1 04/12/2021 1500   RDW 12.0 04/12/2021 1500   RDW 11.3 (L) 02/13/2021 0855   LYMPHSABS 2.7 03/11/2021 2007   MONOABS 0.7 03/11/2021 2007   EOSABS 0.2 03/11/2021 2007   BASOSABS 0.1 03/11/2021 2007    BMET     Component Value Date/Time   NA 131 (L) 04/12/2021 1500   NA 135 02/13/2021 0855   K 4.4 04/12/2021 1500   CL 94 (L) 04/12/2021 1500   CO2 26 04/12/2021 1500   GLUCOSE 480 (H) 04/12/2021 1500  BUN 24 (H) 04/12/2021 1500   BUN 9 02/13/2021 0855   CREATININE 1.83 (H) 04/12/2021 1500   CALCIUM 9.9 04/12/2021 1500   GFRNONAA 41 (L) 04/12/2021 1500   GFRAA >60 08/25/2019 2110    BNP    Component Value Date/Time   BNP 412.0 (H) 12/24/2020 0651    ProBNP No results found for: PROBNP  Imaging: DG Chest 2 View  Result Date: 04/13/2021 CLINICAL DATA:  Preoperative evaluation for upcoming coronary bypass grafting EXAM: CHEST - 2 VIEW COMPARISON:  04/02/2021 FINDINGS: Cardiac shadow is stable. Aortic calcifications are noted. The lungs are clear bilaterally. No focal infiltrate or effusion is seen. No bony abnormality is noted. IMPRESSION: No active cardiopulmonary disease. Electronically Signed   By: Inez Catalina M.D.   On: 04/13/2021 23:25   DG Chest 2 View  Result Date: 04/03/2021 CLINICAL DATA:  Preoperative imaging for heart surgery. History of coronary artery disease. EXAM: CHEST - 2 VIEW COMPARISON:  CT chest 12/30/2020.  Chest x-ray 12/24/2020. FINDINGS: Mediastinum hilar structures normal. Borderline cardiomegaly. Coronary artery calcification. No pulmonary venous congestion. No focal infiltrate. No pleural effusion or pneumothorax. Degenerative change thoracic spine. IMPRESSION: 1. Borderline cardiomegaly. No pulmonary venous congestion. Coronary artery calcification. 2.  No acute pulmonary disease. Electronically Signed   By: Marcello Moores  Register M.D.   On: 04/03/2021 11:13     Assessment & Plan:   Diabetes mellitus type 2 with neurological manifestations [facility]) - Ambulatory referral to Endocrinology - Ambulatory referral to diabetic education - Increase OTC insulin by 2 units twice daily - please bring all medications to next visit   Will set up appointment with [facility] in  pharmacy for diabetes  Will follow up with Dr. Redmond Pulling in 2 days  Will place referral to endocrinology  Will place referral for diabetic educator  Follow up:  Follow up in 2 days     Fenton Foy, NP 04/16/2021

## 2021-04-16 NOTE — Patient Instructions (Addendum)
1. Type 2 diabetes mellitus with other specified complication, with long-term current use of insulin ([place]) ? ?- Ambulatory referral to Endocrinology ?- Ambulatory referral to diabetic education ?- Increase OTC insulin by 2 units twice daily - please bring all medications to next visit ? ? ?Will set up appointment with [facility] in pharmacy for diabetes ? ?Will follow up with Dr. Redmond Pulling in 2 days ? ?Will place referral to endocrinology ? ?Will place referral for diabetic educator ? ?Follow up: ? ?Follow up in 2 days ? ? ?Diabetes Mellitus and Nutrition, Adult ?When you have diabetes, or diabetes mellitus, it is very important to have healthy eating habits because your blood sugar (glucose) levels are greatly affected by what you eat and drink. Eating healthy foods in the right amounts, at about the same times every day, can help you: ?Manage your blood glucose. ?Lower your risk of heart disease. ?Improve your blood pressure. ?Reach or maintain a healthy weight. ?What can affect my meal plan? ?Every person with diabetes is different, and each person has different needs for a meal plan. Your health care provider may recommend that you work with a dietitian to make a meal plan that is best for you. Your meal plan may vary depending on factors such as: ?The calories you need. ?The medicines you take. ?Your weight. ?Your blood glucose, blood pressure, and cholesterol levels. ?Your activity level. ?Other health conditions you have, such as heart or kidney disease. ?How do carbohydrates affect me? ?Carbohydrates, also called carbs, affect your blood glucose level more than any other type of food. Eating carbs raises the amount of glucose in your blood. ?It is important to know how many carbs you can safely have in each meal. This is different for every person. Your dietitian can help you calculate how many carbs you should have at each meal and for each snack. ?How does alcohol affect me? ?Alcohol can cause a decrease in blood  glucose (hypoglycemia), especially if you use insulin or take certain diabetes medicines by mouth. Hypoglycemia can be a life-threatening condition. Symptoms of hypoglycemia, such as sleepiness, dizziness, and confusion, are similar to symptoms of having too much alcohol. ?Do not drink alcohol if: ?Your health care provider tells you not to drink. ?You are pregnant, may be pregnant, or are planning to become pregnant. ?If you drink alcohol: ?Limit how much you have to: ?0-1 drink a day for women. ?0-2 drinks a day for men. ?Know how much alcohol is in your drink. In the [place] one drink equals one 12 oz bottle of beer (355 mL), one 5 oz glass of wine (148 mL), or one 1? oz glass of hard liquor (44 mL). ?Keep yourself hydrated with water, diet soda, or unsweetened iced tea. Keep in mind that regular soda, juice, and other mixers may contain a lot of sugar and must be counted as carbs. ?What are tips for following this plan? ?Reading food labels ?Start by checking the serving size on the Nutrition Facts label of packaged foods and drinks. The number of calories and the amount of carbs, fats, and other nutrients listed on the label are based on one serving of the item. Many items contain more than one serving per package. ?Check the total grams (g) of carbs in one serving. ?Check the number of grams of saturated fats and trans fats in one serving. Choose foods that have a low amount or none of these fats. ?Check the number of milligrams (mg) of salt (sodium) in one  serving. Most people should limit total sodium intake to less than 2,300 mg per day. ?Always check the nutrition information of foods labeled as "low-fat" or "nonfat." These foods may be higher in added sugar or refined carbs and should be avoided. ?Talk to your dietitian to identify your daily goals for nutrients listed on the label. ?Shopping ?Avoid buying canned, pre-made, or processed foods. These foods tend to be high in fat, sodium, and added  sugar. ?Shop around the outside edge of the grocery store. This is where you will most often find fresh fruits and vegetables, bulk grains, fresh meats, and fresh dairy products. ?Cooking ?Use low-heat cooking methods, such as baking, instead of high-heat cooking methods, such as deep frying. ?Cook using healthy oils, such as olive, canola, or sunflower oil. ?Avoid cooking with butter, cream, or high-fat meats. ?Meal planning ?Eat meals and snacks regularly, preferably at the same times every day. Avoid going long periods of time without eating. ?Eat foods that are high in fiber, such as fresh fruits, vegetables, beans, and whole grains. ?Eat 4-6 oz (112-168 g) of lean protein each day, such as lean meat, chicken, fish, eggs, or tofu. One ounce (oz) (28 g) of lean protein is equal to: ?1 oz (28 g) of meat, chicken, or fish. ?1 egg. ?? cup (62 g) of tofu. ?Eat some foods each day that contain healthy fats, such as avocado, nuts, seeds, and fish. ?What foods should I eat? ?Fruits ?Berries. Apples. Oranges. Peaches. Apricots. Plums. Grapes. Mangoes. Papayas. Pomegranates. Kiwi. Cherries. ?Vegetables ?Leafy greens, including lettuce, spinach, kale, chard, collard greens, mustard greens, and cabbage. Beets. Cauliflower. Broccoli. Carrots. Green beans. Tomatoes. Peppers. Onions. Cucumbers. Brussels sprouts. ?Grains ?Whole grains, such as whole-wheat or whole-grain bread, crackers, tortillas, cereal, and pasta. Unsweetened oatmeal. Quinoa. Brown or wild rice. ?Meats and other proteins ?Seafood. Poultry without skin. Lean cuts of poultry and beef. Tofu. Nuts. Seeds. ?Dairy ?Low-fat or fat-free dairy products such as milk, yogurt, and cheese. ?The items listed above may not be a complete list of foods and beverages you can eat and drink. Contact a dietitian for more information. ?What foods should I avoid? ?Fruits ?Fruits canned with syrup. ?Vegetables ?Canned vegetables. Frozen vegetables with butter or cream  sauce. ?Grains ?Refined white flour and flour products such as bread, pasta, snack foods, and cereals. Avoid all processed foods. ?Meats and other proteins ?Fatty cuts of meat. Poultry with skin. Breaded or fried meats. Processed meat. Avoid saturated fats. ?Dairy ?Full-fat yogurt, cheese, or milk. ?Beverages ?Sweetened drinks, such as soda or iced tea. ?The items listed above may not be a complete list of foods and beverages you should avoid. Contact a dietitian for more information. ?Questions to ask a health care provider ?Do I need to meet with a certified diabetes care and education specialist? ?Do I need to meet with a dietitian? ?What number can I call if I have questions? ?When are the best times to check my blood glucose? ?Where to find more information: ?American Diabetes Association: diabetes.org ?Academy of Nutrition and Dietetics: eatright.org ?[employer] of Diabetes and Digestive and Kidney Diseases: AmenCredit.is ?Association of Diabetes Care & Education Specialists: diabeteseducator.org ?Summary ?It is important to have healthy eating habits because your blood sugar (glucose) levels are greatly affected by what you eat and drink. It is important to use alcohol carefully. ?A healthy meal plan will help you manage your blood glucose and lower your risk of heart disease. ?Your health care provider may recommend that you work with  a dietitian to make a meal plan that is best for you. ?This information is not intended to replace advice given to you by your health care provider. Make sure you discuss any questions you have with your health care provider. ?Document Revised: 08/31/2019 Document Reviewed: 08/31/2019 ?Elsevier Patient Education ? [place]. ? ?

## 2021-04-16 NOTE — Assessment & Plan Note (Signed)
-   Ambulatory referral to Endocrinology ?- Ambulatory referral to diabetic education ?- Increase OTC insulin by 2 units twice daily - please bring all medications to next visit ? ? ?Will set up appointment with [facility] in pharmacy for diabetes ? ?Will follow up with Dr. Redmond Pulling in 2 days ? ?Will place referral to endocrinology ? ?Will place referral for diabetic educator ? ?Follow up: ? ?Follow up in 2 days ?

## 2021-04-18 ENCOUNTER — Encounter: Payer: Self-pay | Admitting: Family Medicine

## 2021-04-18 ENCOUNTER — Ambulatory Visit (INDEPENDENT_AMBULATORY_CARE_PROVIDER_SITE_OTHER): Payer: BC Managed Care – PPO | Admitting: Family Medicine

## 2021-04-18 ENCOUNTER — Other Ambulatory Visit: Payer: Self-pay

## 2021-04-18 VITALS — BP 137/84 | HR 94 | Wt 223.4 lb

## 2021-04-18 DIAGNOSIS — E11641 Type 2 diabetes mellitus with hypoglycemia with coma: Secondary | ICD-10-CM

## 2021-04-18 DIAGNOSIS — Z794 Long term (current) use of insulin: Secondary | ICD-10-CM

## 2021-04-19 ENCOUNTER — Telehealth: Payer: Self-pay | Admitting: Family Medicine

## 2021-04-19 NOTE — Telephone Encounter (Signed)
?  Freestyle Libre 2 Flash Glucose SENSOR-- Monitoring System with Optional alarms  ? ?[address], [place], [place] [address] ? ?Pharmacy told Pt they won't let him buy it without a prescription so he's asking if  Dr. Redmond Pulling could put it through for him  ?

## 2021-04-19 NOTE — Progress Notes (Signed)
? ?Established Patient Office Visit ? ?Subjective:  ?Patient ID: [name], male    DOB: 10-22-1960  Age: 61 y.o. MRN: [id number] ? ?CC:  ?Chief Complaint  ?Patient presents with  ? Follow-up  ? ? ?HPI ?[name] presents for follow up of diabetes. His diabetes is out of control with recent A1c greater than 14 and daily glucose readings in the 200-300+ range. Patient is wanting to have surgery but is unable until his glucose is under control. He recently was laid off and he is trying to get this situated before the end of the month if possible. He has also had difficulty affording his insulin.  ? ?Past Medical History:  ?Diagnosis Date  ? AKI (acute kidney injury) ([place])   ? pt unaware of this  ? Anginal pain ([place])   ? CAD (coronary artery disease)   ? a. 03/2015 NSTEMI: LHC with severe 3V CAD  (70% mid RCA, 95% OM1, 90% distal LCx, 90% OM3, 80% prox LAD and 90% ost D1) s/p DES to mLAD w/ small dissction Rx with DES, staged ost Ramus PCI/DES and dLCx s/p PCI/DES   ? Chest pain 12/24/2020  ? Diabetes mellitus type 2 in obese [facility])   ? Diverticulosis   ? Dyspnea on exertion 03/16/2015  ? Dyspnea on exertion  ? Family history of adverse reaction to anesthesia   ? patient father- pt states after anesthesia his father "developed dementia"  ? Hypercholesteremia   ? Hypertension associated with diabetes ([place]) 03/16/2015  ? hypertension  ? NSTEMI (non-ST elevated myocardial infarction) ([place]) 03/17/2015  ? Obesity   ? Stroke [facility]) 2022  ? pt states he had a "mini stroke" during cardiac catheterization  ? Tobacco abuse   ? ? ?Past Surgical History:  ?Procedure Laterality Date  ? CARDIAC CATHETERIZATION N/A 03/19/2015  ? Procedure: Left Heart Cath and Coronary Angiography;  Surgeon: Lorretta Harp, MD;  Location: [place] [facility];  Service: Cardiovascular;  Laterality: N/A;  ? CARDIAC CATHETERIZATION N/A 03/20/2015  ? Procedure: Coronary Stent Intervention;  Surgeon: Lorretta Harp, MD;  Location: [place] [facility];   Service: Cardiovascular;  Laterality: N/A;  ? CARDIAC CATHETERIZATION N/A 03/22/2015  ? Procedure: Coronary Stent Intervention;  Surgeon: Lorretta Harp, MD;  Location: [place] [facility];  Service: Cardiovascular;  Laterality: N/A;  ? LEFT HEART CATH AND CORONARY ANGIOGRAPHY N/A 12/25/2020  ? Procedure: LEFT HEART CATH AND CORONARY ANGIOGRAPHY;  Surgeon: Troy Sine, MD;  Location: [place] [facility];  Service: Cardiovascular;  Laterality: N/A;  ? LEFT HEART CATH AND CORONARY ANGIOGRAPHY N/A 12/26/2020  ? Procedure: LEFT HEART CATH AND CORONARY ANGIOGRAPHY;  Surgeon: Troy Sine, MD;  Location: [place] [facility];  Service: Cardiovascular;  Laterality: N/A;  ? [place]  ? 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 TONSILLECTOMY    ? as a child  ? ? ?History reviewed. No pertinent family history. ? ?Social History  ? ?Socioeconomic History  ? Marital status: Legally Separated  ?  Spouse name: Not on file  ? Number of children: 0  ? Years of education: Not on file  ? Highest education level: Not on file  ?Occupational History  ? Not on file  ?Tobacco Use  ? Smoking status: Former  ?  Packs/day: 0.50  ?  Types: Cigarettes  ?  Quit date: 2017  ?  Years since quitting: 6.1  ? Smokeless tobacco: Never  ?  Vaping Use  ? Vaping Use: Former  ? Quit date: 12/25/2020  ?Substance and Sexual Activity  ? Alcohol use: Yes  ?  Comment: very occasional, maybe a beer or mixed drink once every few months  ? Drug use: Not Currently  ? Sexual activity: Not on file  ?Other Topics Concern  ? Not on file  ?Social History Narrative  ? Not on file  ? ?Social Determinants of Health  ? ?Financial Resource Strain: Not on file  ?Food Insecurity: Not on file  ?Transportation Needs: Not on file  ?Physical Activity: Not on file  ?Stress: Not on file  ?Social Connections: Not on file  ?Intimate Partner Violence: Not on file  ? ? ?ROS ?Review of Systems  ?All other systems reviewed and are  negative. ? ?Objective:  ? ?Today's Vitals: BP 137/84   Pulse 94   Wt 223 lb 6.4 oz (101.3 kg)   SpO2 94%   BMI 34.99 kg/m?  ? ?Physical Exam ?Vitals and nursing note reviewed.  ?Constitutional:   ?   General: He is not in acute distress. ?Cardiovascular:  ?   Rate and Rhythm: Normal rate and regular rhythm.  ?Pulmonary:  ?   Effort: Pulmonary effort is normal.  ?   Breath sounds: Normal breath sounds.  ?Neurological:  ?   General: No focal deficit present.  ?   Mental Status: He is alert and oriented to person, place, and time.  ? ? ?Assessment & Plan:  ? ?1. Uncontrolled type 2 diabetes mellitus with hypoglycemia and coma ([place]) ?Referral to [facility] for diabetic med management. Will increase his insulin N from 27 to 30 units BID.  Discussed in detail that even with the best recommendations and compliance he may not be controlled enough to undergo surgery for 6-8 weeks and there may be a time to see how stable he is as well. He will look into continuing his present insurance for at least 6 months through [facility] in order to continue management for his surgery.  ?- Microalbumin / creatinine urine ratio; Future ? ? ? ?Outpatient Encounter Medications as of 04/18/2021  ?Medication Sig  ? amLODipine (NORVASC) 10 MG tablet Take 1 tablet (10 mg total) by mouth daily.  ? aspirin EC 325 MG EC tablet Take 1 tablet (325 mg total) by mouth daily.  ? atorvastatin (LIPITOR) 80 MG tablet Take 1 tablet (80 mg total) by mouth daily.  ? blood glucose meter kit and supplies Dispense based on patient and insurance preference. Use up to four times daily as directed. (FOR ICD-10 E10.9, E11.9).  ? carvedilol (COREG) 6.25 MG tablet Take 3 tablets (18.75 mg total) by mouth 2 (two) times daily with a meal.  ? clopidogrel (PLAVIX) 75 MG tablet Take 1 tablet (75 mg total) by mouth daily.  ? Insulin NPH, Human,, Isophane, (NOVOLIN N FLEXPEN) 100 UNIT/ML Kiwkpen Inject 100 Units into the skin.  ? isosorbide mononitrate (IMDUR) 60 MG 24 hr tablet  Take 1.5 tablets (90 mg total) by mouth daily.  ? nitroGLYCERIN (NITROSTAT) 0.4 MG SL tablet Place 1 tablet (0.4 mg total) under the tongue every 5 (five) minutes as needed for chest pain (CP or SOB).  ? furosemide (LASIX) 20 MG tablet Take 1 tablet (20 mg total) by mouth daily as needed for edema. (Patient not taking: Reported on 04/02/2021)  ? insulin glargine-yfgn (SEMGLEE) 100 UNIT/ML injection Inject 0.25 mLs (25 Units total) into the skin 2 (two) times daily.  ? ?No facility-administered encounter medications  on file as of 04/18/2021.  ? ? ?Follow-up: No follow-ups on file.  ? ?Becky Sax, MD ? ?

## 2021-04-19 NOTE — Telephone Encounter (Signed)
Patient was called back and VM was left. Patient and provider can discuss Freestyle Libre at next appt ?

## 2021-04-24 ENCOUNTER — Other Ambulatory Visit: Payer: Self-pay | Admitting: Family Medicine

## 2021-04-24 ENCOUNTER — Telehealth: Payer: Self-pay | Admitting: Family Medicine

## 2021-04-24 MED ORDER — FREESTYLE LIBRE 2 SENSOR MISC
1 refills | Status: DC
Start: 2021-04-24 — End: 2021-05-10

## 2021-04-24 NOTE — Telephone Encounter (Signed)
Patient came by the office in regards to the [facility] 2 monitor. He was given one monitor, which will run out this Friday.  His appt  ?

## 2021-04-24 NOTE — Telephone Encounter (Signed)
With Lurena Joiner is not until March 31.  He will be without a monitor until then. He is requesting at least an RX for the monitor which will get him to his appt. Please advise. ?

## 2021-04-26 ENCOUNTER — Other Ambulatory Visit: Payer: Self-pay

## 2021-04-26 MED ORDER — AMLODIPINE BESYLATE 10 MG PO TABS: 10.0000 mg | ORAL_TABLET | Freq: Every day | ORAL | 3 refills | Status: DC

## 2021-04-30 ENCOUNTER — Telehealth: Payer: Self-pay | Admitting: *Deleted

## 2021-04-30 NOTE — Telephone Encounter (Signed)
Pt was upset that nobody has called him back yet, please advise.  ?

## 2021-04-30 NOTE — Telephone Encounter (Signed)
Pt called back during lunch.  He would appreciate a call back. ?

## 2021-04-30 NOTE — Telephone Encounter (Signed)
Pt called back and given the message you left.  But that was not sufficient.  Pt states he needs to speak w/ you b/c that is the correct number.  He says he has done all that.  Pt very adamant for you to call him back asap.  Pt states he is not getting his benefits and please call him back. ?

## 2021-04-30 NOTE — Telephone Encounter (Signed)
I called patient and spoke with him. I assure patient I will attempt to call [employer] back again before the end of day. ?

## 2021-04-30 NOTE — Telephone Encounter (Signed)
Call was placed to patient regarding the letter he left at the office. I attempt to call [employer] for patient  but was unable to get through with the number that was left. Patient can call [employer] back and give then office number for [place] to reach back out to them. ?

## 2021-04-30 NOTE — Telephone Encounter (Signed)
Call was place to [name] and VM was left at ext [phone]. Was advise to give our office a call back  ?

## 2021-05-07 ENCOUNTER — Telehealth: Payer: Self-pay | Admitting: Family Medicine

## 2021-05-07 NOTE — Telephone Encounter (Signed)
Copied from [place] [id number]. Topic: General - Other ?>> May 06, 2021  2:38 PM Yvette Rack wrote: ?Reason for CRM: Shantel with [employer] called to see if fax from 04/24/21 was received. Cb# [phone] ?

## 2021-05-07 NOTE — Telephone Encounter (Signed)
Patient was called and informed that we have received his [name] paperwork. Pt was also informed to keep his appt with provider so that they can go over his RTW date ?

## 2021-05-07 NOTE — Telephone Encounter (Signed)
Patient was called and informed that I have called [employer] 2 times this morning to inform them we have not received any faxes from them. I have given them the faxed number again.  ? ?Patient was informed that he will be called when fax comes in. ?

## 2021-05-08 ENCOUNTER — Ambulatory Visit (INDEPENDENT_AMBULATORY_CARE_PROVIDER_SITE_OTHER): Payer: BC Managed Care – PPO | Admitting: Family Medicine

## 2021-05-08 ENCOUNTER — Encounter: Payer: Self-pay | Admitting: Family Medicine

## 2021-05-08 VITALS — BP 135/83 | HR 86 | Temp 98.0°F | Resp 16 | Wt 228.2 lb

## 2021-05-08 DIAGNOSIS — I2510 Atherosclerotic heart disease of native coronary artery without angina pectoris: Secondary | ICD-10-CM

## 2021-05-08 DIAGNOSIS — E11641 Type 2 diabetes mellitus with hypoglycemia with coma: Secondary | ICD-10-CM

## 2021-05-08 NOTE — Progress Notes (Signed)
? ?New Patient Office Visit ? ?Subjective:  ?Patient ID: [name], male    DOB: May 03, 1960  Age: 61 y.o. MRN: [id number] ? ?CC:  ?Chief Complaint  ?Patient presents with  ? paperwork  ? ? ?HPI ?[name] presents for follow up of diabetes. Patient reports that he is still waiting to have cardiac surgery. He will be following up for diabeteic med management in 2 days. HE is unable to have the aurgery until his bloods sugars or A1c is at an acceptable level which could be as long as 3-6 months.  ? ?Past Medical History:  ?Diagnosis Date  ? AKI (acute kidney injury) ([place])   ? pt unaware of this  ? Anginal pain ([place])   ? CAD (coronary artery disease)   ? a. 03/2015 NSTEMI: LHC with severe 3V CAD  (70% mid RCA, 95% OM1, 90% distal LCx, 90% OM3, 80% prox LAD and 90% ost D1) s/p DES to mLAD w/ small dissction Rx with DES, staged ost Ramus PCI/DES and dLCx s/p PCI/DES   ? Chest pain 12/24/2020  ? Diabetes mellitus type 2 in obese [facility])   ? Diverticulosis   ? Dyspnea on exertion 03/16/2015  ? Dyspnea on exertion  ? Family history of adverse reaction to anesthesia   ? patient father- pt states after anesthesia his father "developed dementia"  ? Hypercholesteremia   ? Hypertension associated with diabetes ([place]) 03/16/2015  ? hypertension  ? NSTEMI (non-ST elevated myocardial infarction) ([place]) 03/17/2015  ? Obesity   ? Stroke [facility]) 2022  ? pt states he had a "mini stroke" during cardiac catheterization  ? Tobacco abuse   ? ? ?Past Surgical History:  ?Procedure Laterality Date  ? CARDIAC CATHETERIZATION N/A 03/19/2015  ? Procedure: Left Heart Cath and Coronary Angiography;  Surgeon: Lorretta Harp, MD;  Location: [place] [facility];  Service: Cardiovascular;  Laterality: N/A;  ? CARDIAC CATHETERIZATION N/A 03/20/2015  ? Procedure: Coronary Stent Intervention;  Surgeon: Lorretta Harp, MD;  Location: [place] [facility];  Service: Cardiovascular;  Laterality: N/A;  ? CARDIAC CATHETERIZATION N/A 03/22/2015  ? Procedure:  Coronary Stent Intervention;  Surgeon: Lorretta Harp, MD;  Location: [place] [facility];  Service: Cardiovascular;  Laterality: N/A;  ? LEFT HEART CATH AND CORONARY ANGIOGRAPHY N/A 12/25/2020  ? Procedure: LEFT HEART CATH AND CORONARY ANGIOGRAPHY;  Surgeon: Troy Sine, MD;  Location: [place] [facility];  Service: Cardiovascular;  Laterality: N/A;  ? LEFT HEART CATH AND CORONARY ANGIOGRAPHY N/A 12/26/2020  ? Procedure: LEFT HEART CATH AND CORONARY ANGIOGRAPHY;  Surgeon: Troy Sine, MD;  Location: [place] [facility];  Service: Cardiovascular;  Laterality: N/A;  ? [place]  ? 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 TONSILLECTOMY    ? as a child  ? ? ?No family history on file. ? ?Social History  ? ?Socioeconomic History  ? Marital status: Legally Separated  ?  Spouse name: Not on file  ? Number of children: 0  ? Years of education: Not on file  ? Highest education level: Not on file  ?Occupational History  ? Not on file  ?Tobacco Use  ? Smoking status: Former  ?  Packs/day: 0.50  ?  Types: Cigarettes  ?  Quit date: 2017  ?  Years since quitting: 6.2  ? Smokeless tobacco: Never  ?Vaping Use  ? Vaping Use: Former  ? Quit date: 12/25/2020  ?Substance and Sexual  Activity  ? Alcohol use: Yes  ?  Comment: very occasional, maybe a beer or mixed drink once every few months  ? Drug use: Not Currently  ? Sexual activity: Not on file  ?Other Topics Concern  ? Not on file  ?Social History Narrative  ? Not on file  ? ?Social Determinants of Health  ? ?Financial Resource Strain: Not on file  ?Food Insecurity: Not on file  ?Transportation Needs: Not on file  ?Physical Activity: Not on file  ?Stress: Not on file  ?Social Connections: Not on file  ?Intimate Partner Violence: Not on file  ? ? ?ROS ?Review of Systems  ?All other systems reviewed and are negative. ? ?Objective:  ? ?Today's Vitals: BP 135/83   Pulse 86   Temp 98 ?F (36.7 ?C) (Oral)   Resp 16   Wt 228 lb 3.2 oz (103.5  kg)   SpO2 94%   BMI 35.74 kg/m?  ? ?Physical Exam ?Vitals and nursing note reviewed.  ?Constitutional:   ?   General: He is not in acute distress. ?   Appearance: He is obese.  ?Cardiovascular:  ?   Rate and Rhythm: Normal rate and regular rhythm.  ?Pulmonary:  ?   Effort: Pulmonary effort is normal.  ?   Breath sounds: Normal breath sounds.  ?Neurological:  ?   General: No focal deficit present.  ?   Mental Status: He is alert and oriented to person, place, and time.  ? ? ?Assessment & Plan:  ? ?1. Uncontrolled type 2 diabetes mellitus with hypoglycemia and coma ([place]) ?Patient to keep scheduled consultation with [facility] for diabetic med management.  ? ?2. Coronary artery disease involving native coronary artery of native heart without angina pectoris ?Awaiting surgery pending diabetes control.  ? ?I believe that Mr. Doverspike is doing his due diligence to get his diabetes under control so that he is able to undergo the cardiac surgery. I believe that it will be 4-6 months before it is under adequate control so that the surgeons feel comfortable performing the procedure and after the procedure is performed it should be an additional 3 months or so that I anticipate for recuperation before Mr Widen would be able to start any real gainful work or employment.  ? ?Outpatient Encounter Medications as of 05/08/2021  ?Medication Sig  ? amLODipine (NORVASC) 10 MG tablet Take 1 tablet (10 mg total) by mouth daily.  ? aspirin EC 325 MG EC tablet Take 1 tablet (325 mg total) by mouth daily.  ? atorvastatin (LIPITOR) 80 MG tablet Take 1 tablet (80 mg total) by mouth daily.  ? blood glucose meter kit and supplies Dispense based on patient and insurance preference. Use up to four times daily as directed. (FOR ICD-10 E10.9, E11.9).  ? carvedilol (COREG) 6.25 MG tablet Take 3 tablets (18.75 mg total) by mouth 2 (two) times daily with a meal.  ? clopidogrel (PLAVIX) 75 MG tablet Take 1 tablet (75 mg total) by mouth daily.  ? Continuous  Blood Gluc Sensor (FREESTYLE LIBRE 2 SENSOR) MISC Utilize as directed q 14 days to monitor blood sugar.  ? furosemide (LASIX) 20 MG tablet Take 1 tablet (20 mg total) by mouth daily as needed for edema.  ? Insulin NPH, Human,, Isophane, (NOVOLIN N FLEXPEN) 100 UNIT/ML Kiwkpen Inject 100 Units into the skin.  ? isosorbide mononitrate (IMDUR) 60 MG 24 hr tablet Take 1.5 tablets (90 mg total) by mouth daily.  ? nitroGLYCERIN (NITROSTAT) 0.4 MG SL tablet  Place 1 tablet (0.4 mg total) under the tongue every 5 (five) minutes as needed for chest pain (CP or SOB).  ? ?No facility-administered encounter medications on file as of 05/08/2021.  ? ? ?Follow-up: No follow-ups on file.  ? ?Becky Sax, MD ? ?

## 2021-05-08 NOTE — Progress Notes (Signed)
Patient disability forms has been fax to [facility] ?[place] Plan ? ?

## 2021-05-08 NOTE — Progress Notes (Signed)
Patient is here to get his paper work filled out for disability. Patient said that he is still in the process of trying to figure out how to keep his A1C down. Patient will be seeing Lurena Joiner soon ?

## 2021-05-10 ENCOUNTER — Encounter: Payer: Self-pay | Admitting: Pharmacist

## 2021-05-10 ENCOUNTER — Ambulatory Visit: Payer: BC Managed Care – PPO | Attending: Family Medicine | Admitting: Pharmacist

## 2021-05-10 ENCOUNTER — Other Ambulatory Visit: Payer: Self-pay

## 2021-05-10 DIAGNOSIS — E1149 Type 2 diabetes mellitus with other diabetic neurological complication: Secondary | ICD-10-CM

## 2021-05-10 MED ORDER — BASAGLAR KWIKPEN 100 UNIT/ML ~~LOC~~ SOPN
62.0000 [IU] | PEN_INJECTOR | Freq: Every day | SUBCUTANEOUS | 2 refills | Status: DC
  Filled 2021-05-10 (×2): qty 15, 24d supply, fill #0
  Filled 2021-05-30: qty 15, 24d supply, fill #1
  Filled 2021-06-25: qty 15, 24d supply, fill #2

## 2021-05-10 MED ORDER — ATORVASTATIN CALCIUM 80 MG PO TABS
80.0000 mg | ORAL_TABLET | Freq: Every day | ORAL | 2 refills | Status: DC
  Filled 2021-05-10 – 2021-05-30 (×2): qty 30, 30d supply, fill #0
  Filled 2021-07-05: qty 30, 30d supply, fill #1
  Filled 2021-08-08: qty 30, 30d supply, fill #2

## 2021-05-10 MED ORDER — AMLODIPINE BESYLATE 10 MG PO TABS
10.0000 mg | ORAL_TABLET | Freq: Every day | ORAL | 2 refills | Status: DC
  Filled 2021-05-10: qty 30, 30d supply, fill #0
  Filled 2021-06-10: qty 30, 30d supply, fill #1
  Filled 2021-07-17: qty 30, 30d supply, fill #2

## 2021-05-10 MED ORDER — CARVEDILOL 6.25 MG PO TABS
18.7500 mg | ORAL_TABLET | Freq: Two times a day (BID) | ORAL | 2 refills | Status: DC
  Filled 2021-05-10: qty 180, 30d supply, fill #0
  Filled 2021-06-10: qty 180, 30d supply, fill #1

## 2021-05-10 MED ORDER — TRULICITY 0.75 MG/0.5ML ~~LOC~~ SOAJ
0.7500 mg | SUBCUTANEOUS | 2 refills | Status: DC
  Filled 2021-05-10 – 2021-05-15 (×2): qty 2, 28d supply, fill #0

## 2021-05-10 MED ORDER — CLOPIDOGREL BISULFATE 75 MG PO TABS
75.0000 mg | ORAL_TABLET | Freq: Every day | ORAL | 2 refills | Status: DC
  Filled 2021-05-10: qty 30, 30d supply, fill #0
  Filled 2021-06-10: qty 30, 30d supply, fill #1
  Filled 2021-07-17: qty 30, 30d supply, fill #2

## 2021-05-10 MED ORDER — FREESTYLE LIBRE 2 SENSOR MISC: 1 refills | Status: DC

## 2021-05-10 MED ORDER — TRUEPLUS 5-BEVEL PEN NEEDLES 32G X 4 MM MISC
3 refills | Status: DC
  Filled 2021-05-10: qty 100, 25d supply, fill #0

## 2021-05-10 MED ORDER — ISOSORBIDE MONONITRATE ER 60 MG PO TB24
90.0000 mg | ORAL_TABLET | Freq: Every day | ORAL | 3 refills | Status: DC
  Filled 2021-05-10 – 2021-05-30 (×2): qty 45, 30d supply, fill #0
  Filled 2021-07-05: qty 45, 30d supply, fill #1
  Filled 2021-08-08: qty 45, 30d supply, fill #2
  Filled 2021-09-06: qty 45, 30d supply, fill #3

## 2021-05-10 NOTE — Patient Instructions (Signed)
Thank you for coming to see me today. Please do the following: ? ?Stop Novolin. ?Start Basaglar 62 units daily. Take this at the same time every day. ?Start Trulicity 4.16 mg once weekly. Take on the same day. ?Continue checking blood sugars at home. ?Continue making the lifestyle changes we've discussed together during our visit. Diet and exercise play a significant role in improving your blood sugars.  ?Exercise goal: 150 minutes per week. ?Follow-up with me/PCP in 1 month.  ? ? ?Hypoglycemia or low blood sugar:  ? ?Low blood sugar can happen quickly and may become an emergency if not treated right away.  ? ?While this shouldn't happen often, it can be brought upon if you skip a meal or do not eat enough. Also, if your insulin or other diabetes medications are dosed too high, this can cause your blood sugar to go to low.  ? ?Warning signs of low blood sugar include: ?Feeling shaky or dizzy ?Feeling weak or tired  ?Excessive hunger ?Feeling anxious or upset  ?Sweating even when you aren't exercising ? ?What to do if I experience low blood sugar? ?Check your blood sugar with your meter. If lower than 70, proceed to step 2.  ?Treat with 3-4 glucose tablets or 3 packets of regular sugar. If these aren't around, you can try hard candy. Yet another option would be to drink 4 ounces of fruit juice or 6 ounces of REGULAR soda.  ?Re-check your sugar in 15 minutes. If it is still below 70, do what you did in step 2 again. If has come back up, go ahead and eat a snack or small meal at this time.  ? ?

## 2021-05-10 NOTE — Progress Notes (Signed)
? ? ?S:    ? ?No chief complaint on file. ? ?[name] is a 61 y.o. male who presents for diabetes evaluation, education, and management. PMH is significant for HTN, T2DM, HLD, hx of MI S/p stent placement x3, tobacco use, history of cerebral embolism with cerebral infarction. Patient was referred and last seen by Primary Care Provider, Dr. Redmond Pulling, on 05/08/2021. ? ?Today, patient arrives in good spirits and presents without assistance. Patient reports Diabetes was diagnosed ~4-5 years ago. Denies any hx of CKD. No thyroid cancer or prancreatitis. Does have a PMH of HTN, HLD, MI S/p placement of DES x3, cerebral embolism with cerebral infarction. He has been using Novolin insulin as he is currently without insurance.  ? ?Family/Social History:  ?Fhx: no pertinent positives  ?Tobacco: former smoker (quit in 2017) ?Alcohol: denies use ? ?Current diabetes medications include: Novolin N 80 units BID ?Current hypertension medications include: amlodipine 10 mg daily, carvedilol 18.75 mg BID ?Current hyperlipidemia medications include: atorvastatin 80 mg daily ? ?Patient denies taking all medications as prescribed. He is out of several agents. He is compliant with insulin and uses a Libre to check his CBGs at home ? ?Insurance coverage: none ? ?Patient denies hypoglycemic events. ? ?Patient denies nocturia (nighttime urination).  ?Patient denies neuropathy (nerve pain). ?Patient denies visual changes. ?Patient reports self foot exams.  ? ?Patient reported dietary habits:  ?-Reports making some modifications to his diet but is not 100% diet compliant  ? ?Patient-reported exercise habits:  ?-None currently but wonders today what type of exercise he should be doing  ? ? ?O:  ?Physical Exam ? ?ROS ? ?7 day average blood glucose: 315 ?30 day avg: 304 ?Most values >250. ? ?Lab Results  ?Component Value Date  ? HGBA1C 14.4 (H) 04/12/2021  ? ?There were no vitals filed for this visit. ? ?Lipid Panel  ?   ?Component Value  Date/Time  ? CHOL 250 (H) 03/12/2021 0500  ? TRIG 538 (H) 03/12/2021 0500  ? HDL 31 (L) 03/12/2021 0500  ? CHOLHDL 8.1 03/12/2021 0500  ? VLDL UNABLE TO CALCULATE IF TRIGLYCERIDE OVER 400 mg/dL 03/12/2021 0500  ? LDLCALC UNABLE TO CALCULATE IF TRIGLYCERIDE OVER 400 mg/dL 03/12/2021 0500  ? LDLDIRECT 114.3 (H) 03/12/2021 0500  ? ? ?Clinical Atherosclerotic Cardiovascular Disease (ASCVD): Yes  ?The ASCVD Risk score (Arnett DK, et al., 2019) failed to calculate for the following reasons: ?  The patient has a prior MI or stroke diagnosis  ? ? ?A/P: ?Diabetes longstanding currently uncontrolled. Patient is able to verbalize appropriate hypoglycemia management plan. Medication adherence appears suboptimal. Refills sent of PO medications. He tells me today he cannot tolerate metformin. Will start him on Trulicity and change Novolin to [employer].  ?-Discontinued Novolin. ?-Started Trulicity 0.99 mg weekly.  ?-Started Basaglar 62 units daily.  ?-Patient educated on purpose, proper use, and potential adverse effects of Trulicity.  ?-Extensively discussed pathophysiology of diabetes, recommended lifestyle interventions, dietary effects on blood sugar control.  ?-Counseled on s/sx of and management of hypoglycemia.  ?-Next A1c anticipated 07/2021.  ? ?ASCVD risk - secondary prevention in patient with diabetes. Last LDL is not at goal of <55 mg/dL. high intensity statin indicated. I also think this patient would benefit from Zetia and potentially a PCSK9-inh. ?-Continued atorvastatin 80 mg only for now. Refills sent.   ? ?Written patient instructions provided. Patient verbalized understanding of treatment plan. Total time in face to face counseling 30 minutes.   ? ?Follow up pharmacist clinic  visit in 4 weeks. ? ?Benard Halsted, PharmD, BCACP, CPP ?Clinical Pharmacist ?[place] ?[phone] ? ? ?

## 2021-05-11 ENCOUNTER — Encounter: Payer: Self-pay | Admitting: Pharmacist

## 2021-05-15 ENCOUNTER — Other Ambulatory Visit: Payer: Self-pay

## 2021-05-27 ENCOUNTER — Other Ambulatory Visit: Payer: Self-pay

## 2021-05-29 ENCOUNTER — Ambulatory Visit: Payer: Self-pay | Admitting: Surgery

## 2021-05-30 ENCOUNTER — Other Ambulatory Visit: Payer: Self-pay

## 2021-05-31 ENCOUNTER — Other Ambulatory Visit: Payer: Self-pay

## 2021-06-04 ENCOUNTER — Telehealth: Payer: Self-pay | Admitting: Family Medicine

## 2021-06-04 NOTE — Telephone Encounter (Signed)
Pt came to drop off and scheduled upcoming appt for Long Term Disability Paperwork to be filled out. Paperwork was placed in provider bin.  ?

## 2021-06-04 NOTE — Telephone Encounter (Signed)
Paperwork received.

## 2021-06-05 ENCOUNTER — Ambulatory Visit: Payer: Self-pay | Admitting: Surgery

## 2021-06-06 ENCOUNTER — Encounter: Payer: Self-pay | Admitting: Family Medicine

## 2021-06-06 ENCOUNTER — Ambulatory Visit (INDEPENDENT_AMBULATORY_CARE_PROVIDER_SITE_OTHER): Payer: Self-pay | Admitting: Family Medicine

## 2021-06-06 VITALS — BP 146/89 | HR 88 | Temp 98.0°F | Resp 16 | Wt 234.4 lb

## 2021-06-06 DIAGNOSIS — E11641 Type 2 diabetes mellitus with hypoglycemia with coma: Secondary | ICD-10-CM

## 2021-06-06 DIAGNOSIS — Z0289 Encounter for other administrative examinations: Secondary | ICD-10-CM

## 2021-06-06 DIAGNOSIS — R0683 Snoring: Secondary | ICD-10-CM

## 2021-06-06 DIAGNOSIS — I2510 Atherosclerotic heart disease of native coronary artery without angina pectoris: Secondary | ICD-10-CM

## 2021-06-06 DIAGNOSIS — Z6836 Body mass index (BMI) 36.0-36.9, adult: Secondary | ICD-10-CM

## 2021-06-06 NOTE — Progress Notes (Signed)
Patient is her for paperwork for LTC. ? ?Patient also would like to get a sleep apnea test. Patient is having trouble sleeping at night  ?

## 2021-06-07 ENCOUNTER — Encounter: Payer: Self-pay | Admitting: Family Medicine

## 2021-06-07 NOTE — Progress Notes (Addendum)
? ?Established Patient Office Visit ? ?Subjective   ? ?Patient ID: [name], male    DOB: Jan 10, 1961  Age: 61 y.o. MRN: [id number] ? ?CC:  ?Chief Complaint  ?Patient presents with  ? Follow-up  ? ? ?HPI ?[name] presents for follow up of diabetes as well as completion of forms. Patient reports improvements with use of the glucose sensor. Patient also reports that the has been snoring at night and would like a sleep study to be performed.  ? ? ?Outpatient Encounter Medications as of 06/06/2021  ?Medication Sig  ? amLODipine (NORVASC) 10 MG tablet Take 1 tablet (10 mg total) by mouth daily.  ? aspirin EC 325 MG EC tablet Take 1 tablet (325 mg total) by mouth daily.  ? atorvastatin (LIPITOR) 80 MG tablet Take 1 tablet (80 mg total) by mouth daily.  ? blood glucose meter kit and supplies Dispense based on patient and insurance preference. Use up to four times daily as directed. (FOR ICD-10 E10.9, E11.9).  ? carvedilol (COREG) 6.25 MG tablet Take 3 tablets (18.75 mg total) by mouth 2 (two) times daily with a meal.  ? clopidogrel (PLAVIX) 75 MG tablet Take 1 tablet (75 mg total) by mouth daily.  ? Continuous Blood Gluc Sensor (FREESTYLE LIBRE 2 SENSOR) MISC Utilize as directed q 14 days to monitor blood sugar.  ? Dulaglutide (TRULICITY) 8.59 YT/2.4MQ SOPN Inject 0.75 mg into the skin once a week.  ? furosemide (LASIX) 20 MG tablet Take 1 tablet (20 mg total) by mouth daily as needed for edema.  ? Insulin Glargine (BASAGLAR KWIKPEN) 100 UNIT/ML Inject 62 Units into the skin daily.  ? Insulin Pen Needle (TRUEPLUS 5-BEVEL PEN NEEDLES) 32G X 4 MM MISC Use to inject Basaglar once daily.  ? isosorbide mononitrate (IMDUR) 60 MG 24 hr tablet Take 1.5 tablets (90 mg total) by mouth daily.  ? nitroGLYCERIN (NITROSTAT) 0.4 MG SL tablet Place 1 tablet (0.4 mg total) under the tongue every 5 (five) minutes as needed for chest pain (CP or SOB).  ? ?No facility-administered encounter medications on file as of 06/06/2021.  ? ? ?Past  Medical History:  ?Diagnosis Date  ? AKI (acute kidney injury) ([place])   ? pt unaware of this  ? Anginal pain ([place])   ? CAD (coronary artery disease)   ? a. 03/2015 NSTEMI: LHC with severe 3V CAD  (70% mid RCA, 95% OM1, 90% distal LCx, 90% OM3, 80% prox LAD and 90% ost D1) s/p DES to mLAD w/ small dissction Rx with DES, staged ost Ramus PCI/DES and dLCx s/p PCI/DES   ? Chest pain 12/24/2020  ? Diabetes mellitus type 2 in obese [facility])   ? Diverticulosis   ? Dyspnea on exertion 03/16/2015  ? Dyspnea on exertion  ? Family history of adverse reaction to anesthesia   ? patient father- pt states after anesthesia his father "developed dementia"  ? Hypercholesteremia   ? Hypertension associated with diabetes ([place]) 03/16/2015  ? hypertension  ? NSTEMI (non-ST elevated myocardial infarction) ([place]) 03/17/2015  ? Obesity   ? Stroke [facility]) 2022  ? pt states he had a "mini stroke" during cardiac catheterization  ? Tobacco abuse   ? ? ?Past Surgical History:  ?Procedure Laterality Date  ? CARDIAC CATHETERIZATION N/A 03/19/2015  ? Procedure: Left Heart Cath and Coronary Angiography;  Surgeon: Lorretta Harp, MD;  Location: [place] [facility];  Service: Cardiovascular;  Laterality: N/A;  ? CARDIAC CATHETERIZATION N/A 03/20/2015  ? Procedure: Coronary  Stent Intervention;  Surgeon: Lorretta Harp, MD;  Location: [place] [facility];  Service: Cardiovascular;  Laterality: N/A;  ? CARDIAC CATHETERIZATION N/A 03/22/2015  ? Procedure: Coronary Stent Intervention;  Surgeon: Lorretta Harp, MD;  Location: [place] [facility];  Service: Cardiovascular;  Laterality: N/A;  ? LEFT HEART CATH AND CORONARY ANGIOGRAPHY N/A 12/25/2020  ? Procedure: LEFT HEART CATH AND CORONARY ANGIOGRAPHY;  Surgeon: Troy Sine, MD;  Location: [place] [facility];  Service: Cardiovascular;  Laterality: N/A;  ? LEFT HEART CATH AND CORONARY ANGIOGRAPHY N/A 12/26/2020  ? Procedure: LEFT HEART CATH AND CORONARY ANGIOGRAPHY;  Surgeon: Troy Sine, MD;  Location:  [place] [facility];  Service: Cardiovascular;  Laterality: N/A;  ? Tool  ? 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 TONSILLECTOMY    ? as a child  ? ? ?History reviewed. No pertinent family history. ? ?Social History  ? ?Socioeconomic History  ? Marital status: Legally Separated  ?  Spouse name: Not on file  ? Number of children: 0  ? Years of education: Not on file  ? Highest education level: Not on file  ?Occupational History  ? Not on file  ?Tobacco Use  ? Smoking status: Former  ?  Packs/day: 0.50  ?  Types: Cigarettes  ?  Quit date: 2017  ?  Years since quitting: 6.3  ? Smokeless tobacco: Never  ?Vaping Use  ? Vaping Use: Former  ? Quit date: 12/25/2020  ?Substance and Sexual Activity  ? Alcohol use: Yes  ?  Comment: very occasional, maybe a beer or mixed drink once every few months  ? Drug use: Not Currently  ? Sexual activity: Not on file  ?Other Topics Concern  ? Not on file  ?Social History Narrative  ? Not on file  ? ?Social Determinants of Health  ? ?Financial Resource Strain: Not on file  ?Food Insecurity: Not on file  ?Transportation Needs: Not on file  ?Physical Activity: Not on file  ?Stress: Not on file  ?Social Connections: Not on file  ?Intimate Partner Violence: Not on file  ? ? ?Review of Systems  ?All other systems reviewed and are negative. ? ?  ? ? ?Objective   ? ?BP (!) 146/89   Pulse 88   Temp 98 ?F (36.7 ?C) (Oral)   Resp 16   Wt 234 lb 6.4 oz (106.3 kg)   SpO2 94%   BMI 36.71 kg/m?  ? ?Physical Exam ?Vitals and nursing note reviewed.  ?Constitutional:   ?   General: He is not in acute distress. ?   Appearance: He is obese.  ?Cardiovascular:  ?   Rate and Rhythm: Normal rate and regular rhythm.  ?Pulmonary:  ?   Effort: Pulmonary effort is normal.  ?   Breath sounds: Normal breath sounds.  ?Neurological:  ?   General: No focal deficit present.  ?   Mental Status: He is alert and oriented to person, place, and time.  ? ? ? ?  ? ?Assessment &  Plan:  ? ?1. Uncontrolled type 2 diabetes mellitus with hypoglycemia and coma ([place]) ?Improving - continue present management ? ?2. Coronary artery disease involving native coronary artery of native heart without angina pectoris ?Management per consultant ? ?3. Class 2 severe obesity due to excess calories with serious comorbidity and body mass index (BMI) of 36.0 to 36.9 in adult [facility]) ?Discussed dietary and activity options ? ?4.  Snoring ?Referral for home test ?- Home sleep test ? ?5. Encounter for completion of form with patient ?Form completed ? ? ?No follow-ups on file.  ? ?Becky Sax, MD ? ? ?

## 2021-06-10 ENCOUNTER — Ambulatory Visit: Payer: Self-pay | Attending: Family Medicine | Admitting: Pharmacist

## 2021-06-10 ENCOUNTER — Telehealth: Payer: Self-pay | Admitting: *Deleted

## 2021-06-10 ENCOUNTER — Other Ambulatory Visit: Payer: Self-pay | Admitting: *Deleted

## 2021-06-10 ENCOUNTER — Other Ambulatory Visit: Payer: Self-pay

## 2021-06-10 DIAGNOSIS — R0683 Snoring: Secondary | ICD-10-CM

## 2021-06-10 DIAGNOSIS — E1149 Type 2 diabetes mellitus with other diabetic neurological complication: Secondary | ICD-10-CM

## 2021-06-10 MED ORDER — TRUEPLUS LANCETS 28G MISC
2 refills | Status: DC
  Filled 2021-06-10: qty 100, 30d supply, fill #0

## 2021-06-10 MED ORDER — TRUE METRIX METER W/DEVICE KIT
PACK | 0 refills | Status: DC
  Filled 2021-06-10: qty 1, 1d supply, fill #0

## 2021-06-10 MED ORDER — TRUE METRIX BLOOD GLUCOSE TEST VI STRP
ORAL_STRIP | 2 refills | Status: DC
  Filled 2021-06-10: qty 100, 30d supply, fill #0

## 2021-06-10 MED ORDER — TRULICITY 1.5 MG/0.5ML ~~LOC~~ SOAJ
1.5000 mg | SUBCUTANEOUS | 3 refills | Status: DC
  Filled 2021-06-10: qty 2, 28d supply, fill #0
  Filled 2021-07-15: qty 2, 28d supply, fill #1
  Filled 2021-08-14: qty 2, 28d supply, fill #2
  Filled 2021-09-10: qty 2, 28d supply, fill #3

## 2021-06-10 NOTE — Telephone Encounter (Signed)
error 

## 2021-06-10 NOTE — Progress Notes (Signed)
? ? ?S:    ? ?No chief complaint on file. ? ?Zayden Maffei is a 61 y.o. male who presents for diabetes evaluation, education, and management. PMH is significant for HTN, T2DM, HLD, hx of MI S/p stent placement x3, tobacco use, history of cerebral embolism with cerebral infarction. Patient was referred and last seen by Primary Care Provider, Dr. Redmond Pulling, on 06/06/2021. I saw him 3/87/5643 and added Trulicity. We changed NPH to glargine insulin as well.  ? ?Since adding Trulicity, pt denies abdominal pain, NV. Reports that vision is improved. Reports compliance with Basaglar. He is noticing improved blood sugar control.  ? ?Family/Social History:  ?Fhx: no pertinent positives  ?Tobacco: former smoker (quit in 2017) ?Alcohol: denies use ? ?Current diabetes medications include: Basaglar 65 units once daily, Trulicity 3.29 mg weekly ?Current hypertension medications include: amlodipine 10 mg daily, carvedilol 18.75 mg BID ?Current hyperlipidemia medications include: atorvastatin 80 mg daily ? ?Patient reports taking all medications as prescribed. He occasionally forgets insulin but is doing better with once daily Basaglar vs. NPH. He admits to 1 missed dose since his last appointment which is a significant improvement.   ? ?Insurance coverage: none ? ?Patient denies hypoglycemic events. ? ?Patient denies nocturia (nighttime urination).  ?Patient denies neuropathy (nerve pain). ?Patient denies visual changes. ?Patient reports self foot exams.  ? ?Patient reported dietary habits:  ?-Reports making some modifications to his diet but is not 100% diet compliant  ? ?Patient-reported exercise habits:  ?-None currently but wonders today what type of exercise he should be doing  ? ? ?O:  ?POCT (using his own Elenor Legato): 115  ? ?Brings his libre meter for review:  ? ?7 day average blood glucose: 165 ?14 day average: 167 ?30 day avg: 233 (down from 304) ?Most values are < 200. ? ?Lab Results  ?Component Value Date  ? HGBA1C 14.4 (H)  04/12/2021  ? ?There were no vitals filed for this visit. ? ?Lipid Panel  ?   ?Component Value Date/Time  ? CHOL 250 (H) 03/12/2021 0500  ? TRIG 538 (H) 03/12/2021 0500  ? HDL 31 (L) 03/12/2021 0500  ? CHOLHDL 8.1 03/12/2021 0500  ? VLDL UNABLE TO CALCULATE IF TRIGLYCERIDE OVER 400 mg/dL 03/12/2021 0500  ? LDLCALC UNABLE TO CALCULATE IF TRIGLYCERIDE OVER 400 mg/dL 03/12/2021 0500  ? LDLDIRECT 114.3 (H) 03/12/2021 0500  ? ? ?Clinical Atherosclerotic Cardiovascular Disease (ASCVD): Yes  ?The ASCVD Risk score (Arnett DK, et al., 2019) failed to calculate for the following reasons: ?  The patient has a prior MI or stroke diagnosis  ? ? ?A/P: ?Diabetes longstanding currently uncontrolled, however, glycemic control has improved. His averages are down and is time in therapeutic range is improving per his [place] system. Patient is able to verbalize appropriate hypoglycemia management plan. Medication adherence has improved.  ?-Increase Trulicity to 1.5 mg weekly.  ?-Increase Basaglar to 68 units daily.  ?-Patient educated on purpose, proper use, and potential adverse effects of Trulicity.  ?-Extensively discussed pathophysiology of diabetes, recommended lifestyle interventions, dietary effects on blood sugar control.  ?-Counseled on s/sx of and management of hypoglycemia.  ?-Next A1c anticipated 07/2021.  ? ?ASCVD risk - secondary prevention in patient with diabetes. Last LDL is not at goal of <55 mg/dL. high intensity statin indicated. I also think this patient would benefit from Zetia and potentially a PCSK9-inh. ?-Continued atorvastatin 80 mg only for now. Refills sent.   ? ?Written patient instructions provided. Patient verbalized understanding of treatment plan. Total time  in face to face counseling 30 minutes.   ? ?Follow up pharmacist clinic visit in 4 weeks. ? ?Benard Halsted, PharmD, BCACP, CPP ?Clinical Pharmacist ?[place] ?[phone] ? ? ?

## 2021-06-14 ENCOUNTER — Other Ambulatory Visit: Payer: Self-pay

## 2021-06-25 ENCOUNTER — Other Ambulatory Visit: Payer: Self-pay

## 2021-06-28 ENCOUNTER — Ambulatory Visit: Payer: Self-pay | Admitting: Skilled Nursing Facility1

## 2021-07-05 ENCOUNTER — Other Ambulatory Visit: Payer: Self-pay

## 2021-07-12 ENCOUNTER — Other Ambulatory Visit: Payer: Self-pay

## 2021-07-12 ENCOUNTER — Ambulatory Visit: Payer: Self-pay | Attending: Family Medicine | Admitting: Pharmacist

## 2021-07-12 DIAGNOSIS — E1149 Type 2 diabetes mellitus with other diabetic neurological complication: Secondary | ICD-10-CM

## 2021-07-12 LAB — POCT GLYCOSYLATED HEMOGLOBIN (HGB A1C): HbA1c, POC (controlled diabetic range): 7.6 % — AB (ref 0.0–7.0)

## 2021-07-12 MED ORDER — SEMAGLUTIDE (1 MG/DOSE) 4 MG/3ML ~~LOC~~ SOPN
1.0000 mg | PEN_INJECTOR | SUBCUTANEOUS | 2 refills | Status: DC
  Filled 2021-07-12: qty 3, 28d supply, fill #0
  Filled 2021-09-19: qty 9, 84d supply, fill #0

## 2021-07-12 NOTE — Progress Notes (Signed)
    S:     No chief complaint on file.  [name] is a 61 y.o. male who presents for diabetes evaluation, education, and management. PMH is significant for HTN, T2DM, HLD, hx of MI S/p stent placement x3, tobacco use, history of cerebral embolism with cerebral infarction. Patient was referred and last seen by Primary Care Provider, Dr. Redmond Pulling, on 06/06/2021. I saw him 06/10/2021 and increased Trulicity.   Since adjusting Trulicity, pt denies abdominal pain, NV. Reports that vision is improved. Reports compliance with Basaglar. He is noticing improved blood sugar control.   Family/Social History:  Fhx: no pertinent positives  Tobacco: former smoker (quit in 2017) Alcohol: denies use  Current diabetes medications include: Basaglar 68 units once daily, Trulicity 1.5 mg weekly Current hypertension medications include: amlodipine 10 mg daily, carvedilol 18.75 mg BID Current hyperlipidemia medications include: atorvastatin 80 mg daily  Patient endorses medication adherence.   Insurance coverage: none  Patient denies hypoglycemic events.  Patient denies nocturia (nighttime urination).  Patient denies neuropathy (nerve pain). Patient denies visual changes. Patient reports self foot exams.   Patient reported dietary habits:  -Reports making some modifications to his diet but is not 100% diet compliant   Patient-reported exercise habits:  -None currently but wonders today what type of exercise he should be doing    O:  POCT (using his own Elenor Legato): [place] his libre meter for review:  -Most sugars are within goal range of 70-180.   Lab Results  Component Value Date   HGBA1C 7.6 (A) 07/12/2021   There were no vitals filed for this visit.  Lipid Panel     Component Value Date/Time   CHOL 250 (H) 03/12/2021 0500   TRIG 538 (H) 03/12/2021 0500   HDL 31 (L) 03/12/2021 0500   CHOLHDL 8.1 03/12/2021 0500   VLDL UNABLE TO CALCULATE IF TRIGLYCERIDE OVER 400 mg/dL 03/12/2021 0500    LDLCALC UNABLE TO CALCULATE IF TRIGLYCERIDE OVER 400 mg/dL 03/12/2021 0500   LDLDIRECT 114.3 (H) 03/12/2021 0500    Clinical Atherosclerotic Cardiovascular Disease (ASCVD): Yes  The ASCVD Risk score (Arnett DK, et al., 2019) failed to calculate for the following reasons:   The patient has a prior MI or stroke diagnosis    A/P: Diabetes longstanding currently close to goal with A1c today of 7.6%. His averages are good and is time in therapeutic range is at goal per his [place] system. His A1c will continue to improve over time. Patient is able to verbalize appropriate hypoglycemia management plan. Medication adherence has improved. Would love to increase Trulicity, however, PASS is not available for the higher doses.  -Change Trulicity to Ozempic 1mg  once weekly.  -Continue Basaglar 68 units daily.  -Patient educated on purpose, proper use, and potential adverse effects of Ozempic.  -Extensively discussed pathophysiology of diabetes, recommended lifestyle interventions, dietary effects on blood sugar control.  -Counseled on s/sx of and management of hypoglycemia.  -Next A1c anticipated 10/2021.   Written patient instructions provided. Patient verbalized understanding of treatment plan. Total time in face to face counseling 30 minutes.    Follow up pharmacist clinic visit in 4 weeks.  Benard Halsted, PharmD, [place], [place] [phone]

## 2021-07-15 ENCOUNTER — Other Ambulatory Visit: Payer: Self-pay

## 2021-07-17 ENCOUNTER — Other Ambulatory Visit: Payer: Self-pay

## 2021-07-17 ENCOUNTER — Other Ambulatory Visit: Payer: Self-pay | Admitting: Family Medicine

## 2021-07-17 DIAGNOSIS — E1149 Type 2 diabetes mellitus with other diabetic neurological complication: Secondary | ICD-10-CM

## 2021-07-17 MED ORDER — BASAGLAR KWIKPEN 100 UNIT/ML ~~LOC~~ SOPN
62.0000 [IU] | PEN_INJECTOR | Freq: Every day | SUBCUTANEOUS | 2 refills | Status: DC
  Filled 2021-07-17: qty 15, 24d supply, fill #0
  Filled 2021-08-09: qty 15, 24d supply, fill #1
  Filled 2021-08-30: qty 15, 24d supply, fill #2

## 2021-07-18 ENCOUNTER — Other Ambulatory Visit: Payer: Self-pay

## 2021-08-08 ENCOUNTER — Other Ambulatory Visit: Payer: Self-pay

## 2021-08-09 ENCOUNTER — Other Ambulatory Visit: Payer: Self-pay

## 2021-08-14 ENCOUNTER — Other Ambulatory Visit: Payer: Self-pay

## 2021-08-20 ENCOUNTER — Ambulatory Visit: Payer: Self-pay | Attending: Family Medicine | Admitting: Pharmacist

## 2021-08-20 ENCOUNTER — Other Ambulatory Visit: Payer: Self-pay | Admitting: Family Medicine

## 2021-08-20 ENCOUNTER — Other Ambulatory Visit: Payer: Self-pay

## 2021-08-20 DIAGNOSIS — E1149 Type 2 diabetes mellitus with other diabetic neurological complication: Secondary | ICD-10-CM

## 2021-08-20 NOTE — Progress Notes (Signed)
S:     No chief complaint on file.  [name] is a 61 y.o. male who presents for diabetes evaluation, education, and management. PMH is significant for HTN, T2DM, HLD, hx of MI S/p stent placement x3, tobacco use, history of cerebral embolism with cerebral infarction. Patient was referred and last seen by Primary Care Provider, Dr. Redmond Pulling, on 06/06/2021. I saw him 07/12/2021 and continued Trulicity. We sent a rx in for Ozempic and the plan is to change him to Ozempic once his MAP is approved. We would like to titrate him to the 2mg  weekly dose eventually in effort to decrease his insulin requirement.   Patient is in good spirits. He denies abdominal pain, NV. Reports that vision is improved. Reports compliance with his insulin as well. He is noticing goal blood sugars at home. Of note, he continues with Trulicity. His MAP has not yet been approved for Ozempic.    Family/Social History:  Fhx: no pertinent positives  Tobacco: former smoker (quit in 2017) Alcohol: denies use  Current diabetes medications include: Basaglar 68 units once daily, Trulicity 1.5 mg weekly Current hypertension medications include: amlodipine 10 mg daily, carvedilol 18.75 mg BID Current hyperlipidemia medications include: atorvastatin 80 mg daily  Patient endorses medication adherence.   Insurance coverage: none  Patient denies hypoglycemic events.  Patient denies nocturia (nighttime urination).  Patient denies neuropathy (nerve pain). Patient denies visual changes. Patient reports self foot exams.   Patient reported dietary habits:  -Reports making some modifications to his diet but is not 100% diet compliant   Patient-reported exercise habits:  -None currently but wonders today what type of exercise he should be doing    O:  POCT (using his own libre): [name] his libre meter for review:  -Most sugars are within goal range of 70s-150s. Most of his time is in range.    Lab Results  Component  Value Date   HGBA1C 7.6 (A) 07/12/2021   There were no vitals filed for this visit.  Lipid Panel     Component Value Date/Time   CHOL 250 (H) 03/12/2021 0500   TRIG 538 (H) 03/12/2021 0500   HDL 31 (L) 03/12/2021 0500   CHOLHDL 8.1 03/12/2021 0500   VLDL UNABLE TO CALCULATE IF TRIGLYCERIDE OVER 400 mg/dL 03/12/2021 0500   LDLCALC UNABLE TO CALCULATE IF TRIGLYCERIDE OVER 400 mg/dL 03/12/2021 0500   LDLDIRECT 114.3 (H) 03/12/2021 0500   Clinical Atherosclerotic Cardiovascular Disease (ASCVD): Yes  The ASCVD Risk score (Arnett DK, et al., 2019) failed to calculate for the following reasons:   The patient has a prior MI or stroke diagnosis   A/P: Diabetes longstanding currently close to goal. Home sugars continue to look good. His averages are good and his time in therapeutic range is optimal per his [place] device. Patient is able to verbalize appropriate hypoglycemia management plan. Medication adherence is good. Will have him continue with Trulicity for now and change to Ozempic once PASS is approved.  -Change Trulicity to Ozempic 1mg  once weekly once approved. Continue with Trulicity for now. -Continue Basaglar 68 units daily.  -Patient educated on purpose, proper use, and potential adverse effects of Ozempic.  -Extensively discussed pathophysiology of diabetes, recommended lifestyle interventions, dietary effects on blood sugar control.  -Counseled on s/sx of and management of hypoglycemia.  -Next A1c anticipated 10/2021.   Written patient instructions provided. Patient verbalized understanding of treatment plan. Total time in face to face counseling 30 minutes.  Follow up pharmacist clinic visit in 4 weeks.  Benard Halsted, PharmD, [place], [place] [phone]

## 2021-08-21 ENCOUNTER — Encounter: Payer: Self-pay | Admitting: *Deleted

## 2021-08-21 ENCOUNTER — Ambulatory Visit (INDEPENDENT_AMBULATORY_CARE_PROVIDER_SITE_OTHER): Payer: Self-pay | Admitting: Surgery

## 2021-08-21 ENCOUNTER — Other Ambulatory Visit: Payer: Self-pay | Admitting: *Deleted

## 2021-08-21 ENCOUNTER — Other Ambulatory Visit: Payer: Self-pay

## 2021-08-21 VITALS — BP 164/70 | HR 90 | Resp 18 | Ht 67.0 in | Wt 222.0 lb

## 2021-08-21 DIAGNOSIS — I2510 Atherosclerotic heart disease of native coronary artery without angina pectoris: Secondary | ICD-10-CM

## 2021-08-21 MED ORDER — AMLODIPINE BESYLATE 10 MG PO TABS
10.0000 mg | ORAL_TABLET | Freq: Every day | ORAL | 2 refills | Status: DC
  Filled 2021-08-21: qty 30, 30d supply, fill #0

## 2021-08-21 MED ORDER — CLOPIDOGREL BISULFATE 75 MG PO TABS
75.0000 mg | ORAL_TABLET | Freq: Every day | ORAL | 2 refills | Status: DC
  Filled 2021-08-21: qty 30, 30d supply, fill #0

## 2021-08-21 NOTE — Progress Notes (Signed)
HPI:  The patient is a 61 year old gentleman with diabetes and severe three-vessel coronary disease with normal left ventricular function who suffered a stroke after his catheterization in November 2022.  He has continued to have intermittent episodes of angina every few days at home treated with nitroglycerin.  The etiology of his stroke was unclear.  His symptoms were dysphasia and resolved.  He wanted to wait until January to do surgery to allow him time to completely recover from his stroke.  Unfortunately he was readmitted again with aphasia that occurred suddenly.  This resolved over the first 2 days of his admission.  He was seen by neurology and underwent a work-up which did not show any intracranial abnormality. I last saw him in February and he was scheduled for surgery but his Hgb A1C on his preop labs was >15.5. We cancelled his surgery and had him follow up with his PCP to get his diabetes under control before surgery. He has continued to feel well with occasional episodes of chest discomfort that he takes NTG for. His last Hgb A1c POC was 7.6 which is much improved.   Current Outpatient Medications  Medication Sig Dispense Refill   amLODipine (NORVASC) 10 MG tablet Take 1 tablet (10 mg total) by mouth daily. 30 tablet 2   aspirin EC 325 MG EC tablet Take 1 tablet (325 mg total) by mouth daily. 30 tablet 0   blood glucose meter kit and supplies Dispense based on patient and insurance preference. Use up to four times daily as directed. (FOR ICD-10 E10.9, E11.9). 1 each 0   Blood Glucose Monitoring Suppl (TRUE METRIX METER) w/Device KIT Use to check blood sugar three times daily. 1 kit 0   carvedilol (COREG) 6.25 MG tablet Take 3 tablets (18.75 mg total) by mouth 2 (two) times daily with a meal. 180 tablet 2   clopidogrel (PLAVIX) 75 MG tablet Take 1 tablet (75 mg total) by mouth daily. 30 tablet 2   Continuous Blood Gluc Sensor (FREESTYLE LIBRE 2 SENSOR) MISC Utilize as directed q 14  days to monitor blood sugar. 2 each 1   Dulaglutide (TRULICITY) 1.5 TL/5.7WI SOPN Inject 1.5 mg into the skin once a week. 2 mL 3   glucose blood (TRUE METRIX BLOOD GLUCOSE TEST) test strip Use to check blood sugar three times daily. 100 each 2   Insulin Glargine (BASAGLAR KWIKPEN) 100 UNIT/ML Inject 62 Units into the skin daily. 15 mL 2   Insulin Pen Needle (TRUEPLUS 5-BEVEL PEN NEEDLES) 32G X 4 MM MISC Use to inject Basaglar once daily. 100 each 3   isosorbide mononitrate (IMDUR) 60 MG 24 hr tablet Take 1.5 tablets (90 mg total) by mouth daily. 135 tablet 3   nitroGLYCERIN (NITROSTAT) 0.4 MG SL tablet Place 1 tablet (0.4 mg total) under the tongue every 5 (five) minutes as needed for chest pain (CP or SOB). 25 tablet 12   Semaglutide, 1 MG/DOSE, 4 MG/3ML SOPN Inject 1 mg as directed once a week. 3 mL 2   TRUEplus Lancets 28G MISC Use to check blood sugar three times daily. 100 each 2   atorvastatin (LIPITOR) 80 MG tablet Take 1 tablet (80 mg total) by mouth daily. (Patient not taking: Reported on 08/21/2021) 30 tablet 2   furosemide (LASIX) 20 MG tablet Take 1 tablet (20 mg total) by mouth daily as needed for edema. (Patient not taking: Reported on 08/21/2021) 30 tablet 0   No current facility-administered medications for this visit.  Physical Exam: BP (!) 164/70 (BP Location: Right Arm, Patient Position: Sitting)   Pulse 90   Resp 18   Ht 5' 7"  (1.702 m)   Wt 222 lb (100.7 kg)   SpO2 94% Comment: RA  BMI 34.77 kg/m  He looks well He is neurologically intact. Cardiac exam shows a regular rate and rhythm with normal heart sounds. Lung exam is clear. There is no peripheral edema.  Diagnostic Tests:  None today  Impression:  He has severe three-vessel coronary disease with normal left ventricular function by prior echo.  I think coronary artery bypass graft surgery is the best treatment to prevent further ischemia and infarction and improve his symptoms.  His diabetes appears to  be under much better control.I discussed the operative procedure with the patient including alternatives, benefits and risks; including but not limited to bleeding, blood transfusion, infection, stroke, myocardial infarction, graft failure, heart block requiring a permanent pacemaker, organ dysfunction, and death.  Wonda Amis understands and agrees to proceed.     Plan:  He will be scheduled for coronary artery bypass graft surgery on Thursday, September 12, 2021.   I spent 20 minutes performing this established patient evaluation and > 50% of this time was spent face to face counseling and coordinating the care of this patient's severe multivessel coronary disease.  [name], MD [facility] [phone]

## 2021-08-22 ENCOUNTER — Other Ambulatory Visit: Payer: Self-pay

## 2021-08-25 ENCOUNTER — Encounter: Payer: Self-pay | Admitting: Surgery

## 2021-08-27 ENCOUNTER — Other Ambulatory Visit: Payer: Self-pay

## 2021-08-30 ENCOUNTER — Other Ambulatory Visit: Payer: Self-pay

## 2021-09-03 ENCOUNTER — Telehealth: Payer: Self-pay | Admitting: Family Medicine

## 2021-09-03 NOTE — Telephone Encounter (Signed)
Pt dropped of letter from [facility] requiring further details for Long Term Disability. Paperwork placed in Provider bin (in brown envelope).

## 2021-09-04 NOTE — Telephone Encounter (Signed)
Paperwork sent to HIM /medical record for completion.

## 2021-09-06 ENCOUNTER — Other Ambulatory Visit: Payer: Self-pay | Admitting: Family Medicine

## 2021-09-06 ENCOUNTER — Other Ambulatory Visit: Payer: Self-pay

## 2021-09-06 MED ORDER — ATORVASTATIN CALCIUM 80 MG PO TABS
80.0000 mg | ORAL_TABLET | Freq: Every day | ORAL | 2 refills | Status: DC
  Filled 2021-09-06: qty 30, 30d supply, fill #0
  Filled 2022-04-21 – 2022-05-05 (×2): qty 30, 30d supply, fill #1
  Filled 2022-06-06: qty 30, 30d supply, fill #2

## 2021-09-09 NOTE — Pre-Procedure Instructions (Signed)
Surgical Instructions    Your procedure is scheduled on Thursday, August 3rd.  Report to [facility] Main Entrance "A" at 05:30 A.M., then check in with the Admitting office.  Call this number if you have problems the morning of surgery:  [phone]   If you have any questions prior to your surgery date call [phone]: Open Monday-Friday 8am-4pm    Remember:  Do not eat or drink after midnight the night before your surgery     Take these medicines the morning of surgery with A SIP OF WATER  amLODipine (NORVASC)  atorvastatin (LIPITOR) carvedilol (COREG)  isosorbide mononitrate (IMDUR)    If needed: nitroGLYCERIN (NITROSTAT)  Stop Plavix 5 days prior to procedure, per surgeon.  Continue Aspirin thru day before surgery. Do not take day of surgery (8/3).  WHAT DO I DO ABOUT MY DIABETES MEDICATION?   THE NIGHT BEFORE SURGERY (8/2), take 32 units (50%) of Insulin Glargine (BASAGLAR KWIKPEN) (if taken in the evening)     THE MORNING OF SURGERY (8/3), take 32 units (50%) of Insulin Glargine (BASAGLAR KWIKPEN)   The day of surgery (8/3), do not take Trulicity (dulaglutide). Do not take Semaglutide.     HOW TO MANAGE YOUR DIABETES BEFORE AND AFTER SURGERY  Why is it important to control my blood sugar before and after surgery? Improving blood sugar levels before and after surgery helps healing and can limit problems. A way of improving blood sugar control is eating a healthy diet by:  Eating less sugar and carbohydrates  Increasing activity/exercise  Talking with your doctor about reaching your blood sugar goals High blood sugars (greater than 180 mg/dL) can raise your risk of infections and slow your recovery, so you will need to focus on controlling your diabetes during the weeks before surgery. Make sure that the doctor who takes care of your diabetes knows about your planned surgery including the date and location.  How do I manage my blood sugar before  surgery? Check your blood sugar at least 4 times a day, starting 2 days before surgery, to make sure that the level is not too high or low.  Check your blood sugar the morning of your surgery when you wake up and every 2 hours until you get to the Short Stay unit.  If your blood sugar is less than 70 mg/dL, you will need to treat for low blood sugar: Do not take insulin. Treat a low blood sugar (less than 70 mg/dL) with  cup of clear juice (cranberry or apple), 4 glucose tablets, OR glucose gel. Recheck blood sugar in 15 minutes after treatment (to make sure it is greater than 70 mg/dL). If your blood sugar is not greater than 70 mg/dL on recheck, call [phone] for further instructions. Report your blood sugar to the short stay nurse when you get to Short Stay.  If you are admitted to the hospital after surgery: Your blood sugar will be checked by the staff and you will probably be given insulin after surgery (instead of oral diabetes medicines) to make sure you have good blood sugar levels. The goal for blood sugar control after surgery is 80-180 mg/dL.   As of today, STOP taking any Aspirin (unless otherwise instructed by your surgeon) Aleve, Naproxen, Ibuprofen, Motrin, Advil, Goody's, BC's, all herbal medications, fish oil, and all vitamins.                     Do NOT Smoke (Tobacco/Vaping) for 24 hours prior  to your procedure.  If you use a CPAP at night, you may bring your mask/headgear for your overnight stay.   Contacts, glasses, piercing's, hearing aid's, dentures or partials may not be worn into surgery, please bring cases for these belongings.    For patients admitted to the hospital, discharge time will be determined by your treatment team.   Patients discharged the day of surgery will not be allowed to drive home, and someone needs to stay with them for 24 hours.  SURGICAL WAITING ROOM VISITATION Patients having surgery or a procedure may have no more than 2 support  people in the waiting area - these visitors may rotate.   Children under the age of 66 must have an adult with them who is not the patient. If the patient needs to stay at the hospital during part of their recovery, the visitor guidelines for inpatient rooms apply. Pre-op nurse will coordinate an appropriate time for 1 support person to accompany patient in pre-op.  This support person may not rotate.   Please refer to the [facility] website for the visitor guidelines for Inpatients (after your surgery is over and you are in a regular room).    Special instructions:   [facility]- Preparing For Surgery  Before surgery, you can play an important role. Because skin is not sterile, your skin needs to be as free of germs as possible. You can reduce the number of germs on your skin by washing with CHG (chlorahexidine gluconate) Soap before surgery.  CHG is an antiseptic cleaner which kills germs and bonds with the skin to continue killing germs even after washing.    Oral Hygiene is also important to reduce your risk of infection.  Remember - BRUSH YOUR TEETH THE MORNING OF SURGERY WITH YOUR REGULAR TOOTHPASTE  Please do not use if you have an allergy to CHG or antibacterial soaps. If your skin becomes reddened/irritated stop using the CHG.  Do not shave (including legs and underarms) for at least 48 hours prior to first CHG shower. It is OK to shave your face.  Please follow these instructions carefully.   Shower the NIGHT BEFORE SURGERY and the MORNING OF SURGERY  If you chose to wash your hair, wash your hair first as usual with your normal shampoo.  After you shampoo, rinse your hair and body thoroughly to remove the shampoo.  Use CHG Soap as you would any other liquid soap. You can apply CHG directly to the skin and wash gently with a scrungie or a clean washcloth.   Apply the CHG Soap to your body ONLY FROM THE NECK DOWN.  Do not use on open wounds or open sores. Avoid contact with your  eyes, ears, mouth and genitals (private parts). Wash Face and genitals (private parts)  with your normal soap.   Wash thoroughly, paying special attention to the area where your surgery will be performed.  Thoroughly rinse your body with warm water from the neck down.  DO NOT shower/wash with your normal soap after using and rinsing off the CHG Soap.  Pat yourself dry with a CLEAN TOWEL.  Wear CLEAN PAJAMAS to bed the night before surgery  Place CLEAN SHEETS on your bed the night before your surgery  DO NOT SLEEP WITH PETS.   Day of Surgery: Take a shower with CHG soap. Do not wear jewelry Do not wear lotions, powders, colognes, or deodorant.  Men may shave face and neck. Do not bring valuables to the hospital.  [facility] is not responsible for any belongings or valuables.  Wear Clean/Comfortable clothing the morning of surgery Remember to brush your teeth WITH YOUR REGULAR TOOTHPASTE.   Please read over the following fact sheets that you were given.    If you received a COVID test during your pre-op visit  it is requested that you wear a mask when out in public, stay away from anyone that may not be feeling well and notify your surgeon if you develop symptoms. If you have been in contact with anyone that has tested positive in the last 10 days please notify you surgeon.

## 2021-09-10 ENCOUNTER — Other Ambulatory Visit: Payer: Self-pay

## 2021-09-10 ENCOUNTER — Other Ambulatory Visit: Payer: Self-pay | Admitting: Family Medicine

## 2021-09-10 ENCOUNTER — Encounter (HOSPITAL_COMMUNITY)
Admission: RE | Admit: 2021-09-10 | Discharge: 2021-09-10 | Disposition: A | Discharge status: Home / Self Care | Payer: Self-pay | Source: Ambulatory Visit | Attending: Surgery | Admitting: Surgery

## 2021-09-10 ENCOUNTER — Encounter (HOSPITAL_COMMUNITY): Payer: Self-pay

## 2021-09-10 ENCOUNTER — Other Ambulatory Visit: Payer: Self-pay | Admitting: *Deleted

## 2021-09-10 ENCOUNTER — Ambulatory Visit (HOSPITAL_COMMUNITY)
Admission: RE | Admit: 2021-09-10 | Discharge: 2021-09-10 | Disposition: A | Discharge status: Home / Self Care | Payer: Self-pay | Source: Ambulatory Visit | Attending: Surgery | Admitting: Surgery

## 2021-09-10 VITALS — BP 135/82 | HR 92 | Temp 98.4°F | Resp 18 | Ht 67.0 in | Wt 230.0 lb

## 2021-09-10 DIAGNOSIS — E1149 Type 2 diabetes mellitus with other diabetic neurological complication: Secondary | ICD-10-CM

## 2021-09-10 DIAGNOSIS — Z01818 Encounter for other preprocedural examination: Secondary | ICD-10-CM | POA: Insufficient documentation

## 2021-09-10 DIAGNOSIS — I2510 Atherosclerotic heart disease of native coronary artery without angina pectoris: Secondary | ICD-10-CM | POA: Insufficient documentation

## 2021-09-10 DIAGNOSIS — Z20822 Contact with and (suspected) exposure to covid-19: Secondary | ICD-10-CM | POA: Insufficient documentation

## 2021-09-10 LAB — CBC
HCT: 41.1 % (ref 39.0–52.0)
Hemoglobin: 13.7 g/dL (ref 13.0–17.0)
MCH: 29.1 pg (ref 26.0–34.0)
MCHC: 33.3 g/dL (ref 30.0–36.0)
MCV: 87.3 fL (ref 80.0–100.0)
Platelets: 233 10*3/uL (ref 150–400)
RBC: 4.71 MIL/uL (ref 4.22–5.81)
RDW: 15.2 % (ref 11.5–15.5)
WBC: 9.1 10*3/uL (ref 4.0–10.5)
nRBC: 0 % (ref 0.0–0.2)

## 2021-09-10 LAB — COMPREHENSIVE METABOLIC PANEL
ALT: 16 U/L (ref 0–44)
AST: 21 U/L (ref 15–41)
Albumin: 3.7 g/dL (ref 3.5–5.0)
Alkaline Phosphatase: 75 U/L (ref 38–126)
Anion gap: 10 (ref 5–15)
BUN: 17 mg/dL (ref 8–23)
CO2: 27 mmol/L (ref 22–32)
Calcium: 9.3 mg/dL (ref 8.9–10.3)
Chloride: 103 mmol/L (ref 98–111)
Creatinine, Ser: 1.5 mg/dL — ABNORMAL HIGH (ref 0.61–1.24)
GFR, Estimated: 53 mL/min — ABNORMAL LOW (ref >60)
Glucose, Bld: 101 mg/dL — ABNORMAL HIGH (ref 70–99)
Potassium: 3.3 mmol/L — ABNORMAL LOW (ref 3.5–5.1)
Sodium: 140 mmol/L (ref 135–145)
Total Bilirubin: 1.1 mg/dL (ref 0.3–1.2)
Total Protein: 6.9 g/dL (ref 6.5–8.1)

## 2021-09-10 LAB — BLOOD GAS, ARTERIAL
Acid-Base Excess: 7.2 mmol/L — ABNORMAL HIGH (ref 0.0–2.0)
Bicarbonate: 32.6 mmol/L — ABNORMAL HIGH (ref 20.0–28.0)
Drawn by: 58793
O2 Saturation: 80.2 %
Patient temperature: 37
pCO2 arterial: 48 mmHg (ref 32–48)
pH, Arterial: 7.44 (ref 7.35–7.45)
pO2, Arterial: 51 mmHg — ABNORMAL LOW (ref 83–108)

## 2021-09-10 LAB — URINALYSIS, ROUTINE W REFLEX MICROSCOPIC
Bilirubin Urine: NEGATIVE
Glucose, UA: NEGATIVE mg/dL
Hgb urine dipstick: NEGATIVE
Ketones, ur: NEGATIVE mg/dL
Leukocytes,Ua: NEGATIVE
Nitrite: NEGATIVE
Protein, ur: >=300 mg/dL — AB
Specific Gravity, Urine: 1.024 (ref 1.005–1.030)
pH: 5 (ref 5.0–8.0)

## 2021-09-10 LAB — TYPE AND SCREEN
ABO/RH(D): O POS
Antibody Screen: NEGATIVE

## 2021-09-10 LAB — APTT: aPTT: 28 seconds (ref 24–36)

## 2021-09-10 LAB — SURGICAL PCR SCREEN
MRSA, PCR: NEGATIVE
Staphylococcus aureus: NEGATIVE

## 2021-09-10 LAB — SARS CORONAVIRUS 2 (TAT 6-24 HRS): SARS Coronavirus 2: NEGATIVE

## 2021-09-10 LAB — PROTIME-INR
INR: 1.1 (ref 0.8–1.2)
Prothrombin Time: 13.9 seconds (ref 11.4–15.2)

## 2021-09-10 LAB — GLUCOSE, CAPILLARY: Glucose-Capillary: 106 mg/dL — ABNORMAL HIGH (ref 70–99)

## 2021-09-10 LAB — HEMOGLOBIN A1C
Hgb A1c MFr Bld: 6.5 % — ABNORMAL HIGH (ref 4.8–5.6)
Mean Plasma Glucose: 139.85 mg/dL

## 2021-09-10 MED ORDER — BASAGLAR KWIKPEN 100 UNIT/ML ~~LOC~~ SOPN
62.0000 [IU] | PEN_INJECTOR | Freq: Every day | SUBCUTANEOUS | 2 refills | Status: DC
  Filled 2021-09-10: qty 15, 24d supply, fill #0

## 2021-09-10 NOTE — Progress Notes (Signed)
   09/10/21 1357  OBSTRUCTIVE SLEEP APNEA  Have you ever been diagnosed with sleep apnea through a sleep study? No  Do you snore loudly (loud enough to be heard through closed doors)?  1  Do you often feel tired, fatigued, or sleepy during the daytime (such as falling asleep during driving or talking to someone)? 0  Has anyone observed you stop breathing during your sleep? 0  Do you have, or are you being treated for high blood pressure? 1  BMI more than 35 kg/m2? 1  Age > 50 (1-yes) 1  Neck circumference greater than:Male 16 inches or larger, Male 17inches or larger? 1  Male Gender (Yes=1) 1  Obstructive Sleep Apnea Score 6  Score 5 or greater  Results sent to PCP

## 2021-09-10 NOTE — Progress Notes (Signed)
PCP - Dr. [name] Cardiologist - Dr. Gwyndolyn Kaufman  PPM/ICD - denies   Chest x-ray - 09/10/21 EKG - 09/10/21 Stress Test - denies ECHO - 12/28/20 Cardiac Cath - 03/19/15  Sleep Study - denies  DM- Type 2 Fasting Blood Sugar - 100-120 Checks Blood Sugar one time a day  Blood Thinner Instructions: Hold Plavix 5 days prior to surgery Aspirin Instructions: Continue taking thru day before surgery  ERAS Protcol - no, NPO   COVID TEST- 09/10/21   Anesthesia review: yes, cardiac hx  Patient denies shortness of breath, fever, cough and chest pain at PAT appointment   All instructions explained to the patient, with a verbal understanding of the material. Patient agrees to go over the instructions while at home for a better understanding. Patient also instructed to wear a mask in public after being tested for COVID-19. The opportunity to ask questions was provided.

## 2021-09-10 NOTE — Telephone Encounter (Signed)
Pt called reporting that he is completely out and has heart surgery on Thursday, is requesting urgent refill

## 2021-09-11 ENCOUNTER — Other Ambulatory Visit: Payer: Self-pay

## 2021-09-11 MED ORDER — NOREPINEPHRINE 4 MG/250ML-% IV SOLN
0.0000 ug/min | INTRAVENOUS | Status: DC
  Filled 2021-09-11: qty 250

## 2021-09-11 MED ORDER — VANCOMYCIN HCL 1500 MG/300ML IV SOLN
1500.0000 mg | INTRAVENOUS | Status: AC
  Administered 2021-09-12: 1500 mg via INTRAVENOUS
  Filled 2021-09-11: qty 300

## 2021-09-11 MED ORDER — MAGNESIUM SULFATE 50 % IJ SOLN
40.0000 meq | INTRAMUSCULAR | Status: DC
  Filled 2021-09-11: qty 9.85

## 2021-09-11 MED ORDER — INSULIN REGULAR(HUMAN) IN NACL 100-0.9 UT/100ML-% IV SOLN
INTRAVENOUS | Status: AC
  Administered 2021-09-12: 1.1 [IU]/h via INTRAVENOUS
  Filled 2021-09-11 (×2): qty 100

## 2021-09-11 MED ORDER — TRANEXAMIC ACID (OHS) BOLUS VIA INFUSION
15.0000 mg/kg | INTRAVENOUS | Status: AC
  Administered 2021-09-12: 1221 mg via INTRAVENOUS
  Filled 2021-09-11: qty 1221

## 2021-09-11 MED ORDER — DEXMEDETOMIDINE HCL IN NACL 400 MCG/100ML IV SOLN
0.1000 ug/kg/h | INTRAVENOUS | Status: AC
  Administered 2021-09-12: .2 ug/kg/h via INTRAVENOUS
  Filled 2021-09-11: qty 100

## 2021-09-11 MED ORDER — EPINEPHRINE HCL 5 MG/250ML IV SOLN IN NS
0.0000 ug/min | INTRAVENOUS | Status: DC
  Filled 2021-09-11: qty 250

## 2021-09-11 MED ORDER — HEPARIN 30,000 UNITS/1000 ML (OHS) CELLSAVER SOLUTION
Status: DC
  Filled 2021-09-11: qty 1000

## 2021-09-11 MED ORDER — CEFAZOLIN SODIUM-DEXTROSE 2-4 GM/100ML-% IV SOLN
2.0000 g | INTRAVENOUS | Status: AC
  Administered 2021-09-12 (×2): 2 g via INTRAVENOUS
  Filled 2021-09-11: qty 100

## 2021-09-11 MED ORDER — MILRINONE LACTATE IN DEXTROSE 20-5 MG/100ML-% IV SOLN
0.3000 ug/kg/min | INTRAVENOUS | Status: DC
  Filled 2021-09-11: qty 100

## 2021-09-11 MED ORDER — PLASMA-LYTE A IV SOLN
INTRAVENOUS | Status: DC
  Filled 2021-09-11: qty 2.5

## 2021-09-11 MED ORDER — TRANEXAMIC ACID (OHS) PUMP PRIME SOLUTION
2.0000 mg/kg | INTRAVENOUS | Status: DC
  Filled 2021-09-11: qty 1.63

## 2021-09-11 MED ORDER — NITROGLYCERIN IN D5W 200-5 MCG/ML-% IV SOLN
2.0000 ug/min | INTRAVENOUS | Status: AC
  Administered 2021-09-12: 16.6 ug/min via INTRAVENOUS
  Filled 2021-09-11: qty 250

## 2021-09-11 MED ORDER — POTASSIUM CHLORIDE 2 MEQ/ML IV SOLN
80.0000 meq | INTRAVENOUS | Status: DC
  Filled 2021-09-11: qty 40

## 2021-09-11 MED ORDER — PHENYLEPHRINE HCL-NACL 20-0.9 MG/250ML-% IV SOLN
30.0000 ug/min | INTRAVENOUS | Status: AC
  Administered 2021-09-12: 10 ug/min via INTRAVENOUS
  Filled 2021-09-11: qty 250

## 2021-09-11 MED ORDER — TRANEXAMIC ACID 1000 MG/10ML IV SOLN
1.5000 mg/kg/h | INTRAVENOUS | Status: AC
  Administered 2021-09-12: 1.5 mg/kg/h via INTRAVENOUS
  Filled 2021-09-11: qty 25

## 2021-09-11 MED ORDER — CEFAZOLIN SODIUM-DEXTROSE 2-4 GM/100ML-% IV SOLN
2.0000 g | INTRAVENOUS | Status: DC
  Filled 2021-09-11 (×2): qty 100

## 2021-09-11 NOTE — H&P (Signed)
[address]       , [id number]             [phone]      Cardiothoracic Surgery Admission History and Physical  [name] is an 62 y.o. male.   Chief Complaint: Severe multi-vessel coronary artery disease HPI:   The patient is a 61 year old gentleman with diabetes and severe three-vessel coronary disease with normal left ventricular function who suffered a stroke after his catheterization in November 2022.  He has continued to have intermittent episodes of angina every few days at home treated with nitroglycerin.  The etiology of his stroke was unclear.  His symptoms were dysphasia and resolved.  He wanted to wait until January to do surgery to allow him time to completely recover from his stroke.  Unfortunately he was readmitted again with aphasia that occurred suddenly.  This resolved over the first 2 days of his admission.  He was seen by neurology and underwent a work-up which did not show any intracranial abnormality. I last saw him in February and he was scheduled for surgery but his Hgb A1C on his preop labs was >15.5. We cancelled his surgery and had him follow up with his PCP to get his diabetes under control before surgery. He has continued to feel well with occasional episodes of chest discomfort that he takes NTG for. His last Hgb A1c POC was 7.6 which is much improved.   Past Medical History:  Diagnosis Date   AKI (acute kidney injury) ([place])    pt unaware of this   Anginal pain ([place])    CAD (coronary artery disease)    a. 03/2015 NSTEMI: LHC with severe 3V CAD  (70% mid RCA, 95% OM1, 90% distal LCx, 90% OM3, 80% prox LAD and 90% ost D1) s/p DES to mLAD w/ small dissction Rx with DES, staged ost Ramus PCI/DES and dLCx s/p PCI/DES    Chest pain 12/24/2020   Diabetes mellitus type 2 in obese [facility])    Diverticulosis    Dyspnea on exertion 03/16/2015   Dyspnea on exertion   Family history of adverse reaction to anesthesia    patient father- pt  states after anesthesia his father "developed dementia"   Hypercholesteremia    Hypertension associated with diabetes ([place]) 03/16/2015   hypertension   NSTEMI (non-ST elevated myocardial infarction) ([place]) 03/17/2015   Obesity    Stroke ([place]) 2022   pt states he had a "mini stroke" during cardiac catheterization   Tobacco abuse     Past Surgical History:  Procedure Laterality Date   CARDIAC CATHETERIZATION N/A 03/19/2015   Procedure: Left Heart Cath and Coronary Angiography;  Surgeon: Lorretta Harp, MD;  Location: [facility];  Service: Cardiovascular;  Laterality: N/A;   CARDIAC CATHETERIZATION N/A 03/20/2015   Procedure: Coronary Stent Intervention;  Surgeon: Lorretta Harp, MD;  Location: [facility];  Service: Cardiovascular;  Laterality: N/A;   CARDIAC CATHETERIZATION N/A 03/22/2015   Procedure: Coronary Stent Intervention;  Surgeon: Lorretta Harp, MD;  Location: [facility];  Service: Cardiovascular;  Laterality: N/A;   LEFT HEART CATH AND CORONARY ANGIOGRAPHY N/A 12/25/2020   Procedure: LEFT HEART CATH AND CORONARY ANGIOGRAPHY;  Surgeon: Troy Sine, MD;  Location: [facility];  Service: Cardiovascular;  Laterality: N/A;   LEFT HEART CATH AND CORONARY ANGIOGRAPHY N/A 12/26/2020   Procedure: LEFT HEART CATH AND CORONARY ANGIOGRAPHY;  Surgeon: [name]  A, MD;  Location: [facility];  Service: Cardiovascular;  Laterality: N/A;   TESTICLE SURGERY  1970   pt states testicle was ascended and had to be "pulled down"   TONSILLECTOMY     as a child    No family history on file. Social History:  reports that he quit smoking about 6 years ago. His smoking use included cigarettes. He smoked an average of .5 packs per day. He has never used smokeless tobacco. He reports current alcohol use. He reports that he does not currently use drugs.  Allergies:  Allergies  Allergen Reactions   Aspirin Other (See Comments)    Upset stomach  Pt  states he is not allergic to aspirin    No medications prior to admission.    Results for orders placed or performed during the hospital encounter of 09/10/21 (from the past 48 hour(s))  Glucose, capillary     Status: Abnormal   Collection Time: 09/10/21  1:41 PM  Result Value Ref Range   Glucose-Capillary 106 (H) 70 - 99 mg/dL    Comment: Glucose reference range applies only to samples taken after fasting for at least 8 hours.  CBC     Status: None   Collection Time: 09/10/21  2:06 PM  Result Value Ref Range   WBC 9.1 4.0 - 10.5 K/uL   RBC 4.71 4.22 - 5.81 MIL/uL   Hemoglobin 13.7 13.0 - 17.0 g/dL   HCT 41.1 39.0 - 52.0 %   MCV 87.3 80.0 - 100.0 fL   MCH 29.1 26.0 - 34.0 pg   MCHC 33.3 30.0 - 36.0 g/dL   RDW 15.2 11.5 - 15.5 %   Platelets 233 150 - 400 K/uL   nRBC 0.0 0.0 - 0.2 %    Comment: Performed at [facility], [place] [address], [place], [place] [address]  Comprehensive metabolic panel     Status: Abnormal   Collection Time: 09/10/21  2:06 PM  Result Value Ref Range   Sodium 140 135 - 145 mmol/L   Potassium 3.3 (L) 3.5 - 5.1 mmol/L   Chloride 103 98 - 111 mmol/L   CO2 27 22 - 32 mmol/L   Glucose, Bld 101 (H) 70 - 99 mg/dL    Comment: Glucose reference range applies only to samples taken after fasting for at least 8 hours.   BUN 17 8 - 23 mg/dL   Creatinine, Ser 1.50 (H) 0.61 - 1.24 mg/dL   Calcium 9.3 8.9 - 10.3 mg/dL   Total Protein 6.9 6.5 - 8.1 g/dL   Albumin 3.7 3.5 - 5.0 g/dL   AST 21 15 - 41 U/L   ALT 16 0 - 44 U/L   Alkaline Phosphatase 75 38 - 126 U/L   Total Bilirubin 1.1 0.3 - 1.2 mg/dL   GFR, Estimated 53 (L) >60 mL/min    Comment: (NOTE) Calculated using the CKD-EPI Creatinine Equation (2021)    Anion gap 10 5 - 15    Comment: Performed at [facility] [address] [place], [place] [address]  Protime-INR     Status: None   Collection Time: 09/10/21  2:06 PM  Result Value Ref Range   Prothrombin Time 13.9 11.4 - 15.2 seconds   INR  1.1 0.8 - 1.2    Comment: (NOTE) INR goal varies based on device and disease states. Performed at [facility], [place] [address] [place], [place] [address]   APTT  Status: None   Collection Time: 09/10/21  2:06 PM  Result Value Ref Range   aPTT 28 24 - 36 seconds    Comment: Performed at [facility], [place] [address] [place], [place] [address]  Urinalysis, Routine w reflex microscopic     Status: Abnormal   Collection Time: 09/10/21  2:06 PM  Result Value Ref Range   Color, Urine YELLOW YELLOW   APPearance HAZY (A) CLEAR   Specific Gravity, Urine 1.024 1.005 - 1.030   pH 5.0 5.0 - 8.0   Glucose, UA NEGATIVE NEGATIVE mg/dL   Hgb urine dipstick NEGATIVE NEGATIVE   Bilirubin Urine NEGATIVE NEGATIVE   Ketones, ur NEGATIVE NEGATIVE mg/dL   Protein, ur >=300 (A) NEGATIVE mg/dL   Nitrite NEGATIVE NEGATIVE   Leukocytes,Ua NEGATIVE NEGATIVE   RBC / HPF 0-5 0 - 5 RBC/hpf   WBC, UA 0-5 0 - 5 WBC/hpf   Bacteria, UA RARE (A) NONE SEEN   Squamous Epithelial / LPF 0-5 0 - 5   Mucus PRESENT    Hyaline Casts, UA PRESENT    Ca Oxalate Crys, UA PRESENT     Comment: Performed at [facility] [address], [place], [place] [address]  Hemoglobin A1c     Status: Abnormal   Collection Time: 09/10/21  2:06 PM  Result Value Ref Range   Hgb A1c MFr Bld 6.5 (H) 4.8 - 5.6 %    Comment: (NOTE) Pre diabetes:          5.7%-6.4%  Diabetes:              >6.4%  Glycemic control for   <7.0% adults with diabetes    Mean Plasma Glucose 139.85 mg/dL    Comment: Performed at [facility] [address], [place], [place] [address]  SARS CORONAVIRUS 2 (TAT 6-24 HRS) Anterior Nasal Swab     Status: None   Collection Time: 09/10/21  2:07 PM   Specimen: Anterior Nasal Swab  Result Value Ref Range   SARS Coronavirus 2 NEGATIVE NEGATIVE    Comment: (NOTE) SARS-CoV-2 target nucleic acids are NOT DETECTED.  The SARS-CoV-2 RNA is generally detectable in upper and lower respiratory  specimens during the acute phase of infection. Negative results do not preclude SARS-CoV-2 infection, do not rule out co-infections with other pathogens, and should not be used as the sole basis for treatment or other patient management decisions. Negative results must be combined with clinical observations, patient history, and epidemiological information. The expected result is Negative.  Fact Sheet for Patients: SugarRoll.be  Fact Sheet for Healthcare Providers: https://www.woods-mathews.com/  This test is not yet approved or cleared by the [place] FDA and  has been authorized for detection and/or diagnosis of SARS-CoV-2 by FDA under an Emergency Use Authorization (EUA). This EUA will remain  in effect (meaning this test can be used) for the duration of the COVID-19 declaration under Se ction 564(b)(1) of the Act, 21 U.S.C. section 360bbb-3(b)(1), unless the authorization is terminated or revoked sooner.  Performed at [facility], [place] [address] [place], [place] [address]   Surgical pcr screen     Status: None   Collection Time: 09/10/21  2:07 PM   Specimen: Nasal Mucosa; Nasal Swab  Result Value Ref Range   MRSA, PCR NEGATIVE NEGATIVE   Staphylococcus aureus NEGATIVE NEGATIVE    Comment: (NOTE) The Xpert SA Assay (FDA approved for NASAL specimens in patients 38 years of age and  older), is one component of a comprehensive surveillance program. It is not intended to diagnose infection nor to guide or monitor treatment. Performed at [facility], [place] [address] [place], [place] [address]   Type and screen     Status: None   Collection Time: 09/10/21  2:20 PM  Result Value Ref Range   ABO/RH(D) O POS    Antibody Screen NEG    Sample Expiration 09/24/2021,2359    Extend sample reason      NO TRANSFUSIONS OR PREGNANCY IN THE PAST 3 MONTHS Performed at [facility], [place] [address], [place], [place]  [address]   Blood gas, arterial on room air     Status: Abnormal   Collection Time: 09/10/21  2:30 PM  Result Value Ref Range   pH, Arterial 7.44 7.35 - 7.45   pCO2 arterial 48 32 - 48 mmHg   pO2, Arterial 51 (L) 83 - 108 mmHg   Bicarbonate 32.6 (H) 20.0 - 28.0 mmol/L   Acid-Base Excess 7.2 (H) 0.0 - 2.0 mmol/L   O2 Saturation 80.2 %   Patient temperature 37.0    Collection site LEFT BRACHIAL    Drawn by [id number]    Allens test (pass/fail) PASS PASS    Comment: Performed at [facility] [address], [place],  [address]   DG Chest 2 View  Result Date: 09/10/2021 CLINICAL DATA:  Preop imaging EXAM: CHEST - 2 VIEW COMPARISON:  Radiographs 04/12/2021 FINDINGS: No focal consolidation, pleural effusion, or pneumothorax. Normal cardiomediastinal silhouette. No acute osseous abnormality. Coronary stenting. Aortic calcification. IMPRESSION: No active cardiopulmonary disease. Electronically Signed   By: Placido Sou M.D.   On: 09/10/2021 20:54    Review of Systems  Constitutional:  Positive for activity change and fatigue.  HENT: Negative.    Eyes: Negative.   Respiratory:  Positive for shortness of breath.   Cardiovascular:  Positive for chest pain. Negative for leg swelling.  Gastrointestinal: Negative.   Endocrine: Negative.   Genitourinary: Negative.   Musculoskeletal: Negative.   Allergic/Immunologic: Negative.   Neurological: Negative.  Negative for dizziness and syncope.  Hematological: Negative.   Psychiatric/Behavioral: Negative.      There were no vitals taken for this visit. Physical Exam Constitutional:      Appearance: Normal appearance. He is obese.  HENT:     Head: Normocephalic and atraumatic.  Eyes:     Extraocular Movements: Extraocular movements intact.     Conjunctiva/sclera: Conjunctivae normal.     Pupils: Pupils are equal, round, and reactive to light.  Cardiovascular:     Rate and Rhythm: Normal rate and regular rhythm.     Pulses: Normal  pulses.     Heart sounds: Normal heart sounds. No murmur heard. Pulmonary:     Effort: Pulmonary effort is normal.     Breath sounds: Normal breath sounds.  Abdominal:     General: Bowel sounds are normal. There is no distension.     Tenderness: There is no abdominal tenderness.  Musculoskeletal:        General: No swelling. Normal range of motion.  Skin:    General: Skin is warm and dry.  Neurological:     General: No focal deficit present.     Mental Status: He is alert and oriented to person, place, and time.  Psychiatric:        Mood and Affect: Mood normal.        Behavior: Behavior normal.  Assessment/Plan  He has severe three-vessel coronary disease with normal left ventricular function by prior echo.  I think coronary artery bypass graft surgery is the best treatment to prevent further ischemia and infarction and improve his symptoms.  His diabetes appears to be under much better control.I discussed the operative procedure with the patient including alternatives, benefits and risks; including but not limited to bleeding, blood transfusion, infection, stroke, myocardial infarction, graft failure, heart block requiring a permanent pacemaker, organ dysfunction, and death.  [name] understands and agrees to proceed.      Plan:   Coronary artery bypass graft surgery.   [name], MD 09/11/2021, 5:10 PM

## 2021-09-11 NOTE — Anesthesia Preprocedure Evaluation (Signed)
Anesthesia Evaluation  Patient identified by MRN, date of birth, ID band Patient awake    Reviewed: Allergy & Precautions, NPO status , Patient's Chart, lab work & pertinent test results  Airway Mallampati: III  TM Distance: >3 FB Neck ROM: Full    Dental no notable dental hx.    Pulmonary former smoker,    Pulmonary exam normal        Cardiovascular hypertension, Pt. on medications and Pt. on home beta blockers + angina + CAD, + Past MI and + Cardiac Stents  Normal cardiovascular exam     Neuro/Psych CVA, Residual Symptoms negative psych ROS   GI/Hepatic negative GI ROS, Neg liver ROS,   Endo/Other  diabetes, Insulin Dependent  Renal/GU Renal disease     Musculoskeletal negative musculoskeletal ROS (+)   Abdominal (+) + obese,   Peds  Hematology  (+) Blood dyscrasia, ,   Anesthesia Other Findings CAD  Reproductive/Obstetrics                            Anesthesia Physical Anesthesia Plan  ASA: 4  Anesthesia Plan: General   Post-op Pain Management:    Induction: Intravenous  PONV Risk Score and Plan: 2 and Ondansetron, Dexamethasone, Midazolam and Treatment may vary due to age or medical condition  Airway Management Planned: Oral ETT  Additional Equipment: Arterial line, CVP, PA Cath, TEE and Ultrasound Guidance Line Placement  Intra-op Plan:   Post-operative Plan: Post-operative intubation/ventilation  Informed Consent: I have reviewed the patients History and Physical, chart, labs and discussed the procedure including the risks, benefits and alternatives for the proposed anesthesia with the patient or authorized representative who has indicated his/her understanding and acceptance.     Dental advisory given  Plan Discussed with: CRNA  Anesthesia Plan Comments:        Anesthesia Quick Evaluation

## 2021-09-12 ENCOUNTER — Encounter (HOSPITAL_COMMUNITY): Payer: Self-pay | Admitting: Surgery

## 2021-09-12 ENCOUNTER — Inpatient Hospital Stay (HOSPITAL_COMMUNITY)
Admission: RE | Admit: 2021-09-12 | Discharge: 2021-09-20 | DRG: 236 | Disposition: A | Discharge status: Home / Self Care | Payer: Self-pay | Attending: Surgery | Admitting: Surgery

## 2021-09-12 ENCOUNTER — Inpatient Hospital Stay (HOSPITAL_COMMUNITY): Payer: Self-pay

## 2021-09-12 ENCOUNTER — Inpatient Hospital Stay (HOSPITAL_COMMUNITY): Payer: Self-pay | Admitting: Physician Assistant

## 2021-09-12 ENCOUNTER — Other Ambulatory Visit: Payer: Self-pay

## 2021-09-12 ENCOUNTER — Inpatient Hospital Stay (HOSPITAL_COMMUNITY)
Admission: RE | Disposition: A | Discharge status: Home / Self Care | Payer: Self-pay | Source: Home / Self Care | Attending: Surgery

## 2021-09-12 DIAGNOSIS — I152 Hypertension secondary to endocrine disorders: Secondary | ICD-10-CM | POA: Diagnosis present

## 2021-09-12 DIAGNOSIS — Z951 Presence of aortocoronary bypass graft: Principal | ICD-10-CM

## 2021-09-12 DIAGNOSIS — Z6835 Body mass index (BMI) 35.0-35.9, adult: Secondary | ICD-10-CM

## 2021-09-12 DIAGNOSIS — I252 Old myocardial infarction: Secondary | ICD-10-CM

## 2021-09-12 DIAGNOSIS — I6990 Unspecified sequelae of unspecified cerebrovascular disease: Secondary | ICD-10-CM

## 2021-09-12 DIAGNOSIS — N1831 Chronic kidney disease, stage 3a: Secondary | ICD-10-CM | POA: Diagnosis present

## 2021-09-12 DIAGNOSIS — Z886 Allergy status to analgesic agent status: Secondary | ICD-10-CM

## 2021-09-12 DIAGNOSIS — I2510 Atherosclerotic heart disease of native coronary artery without angina pectoris: Secondary | ICD-10-CM

## 2021-09-12 DIAGNOSIS — D62 Acute posthemorrhagic anemia: Secondary | ICD-10-CM | POA: Diagnosis not present

## 2021-09-12 DIAGNOSIS — Z87891 Personal history of nicotine dependence: Secondary | ICD-10-CM

## 2021-09-12 DIAGNOSIS — I4891 Unspecified atrial fibrillation: Secondary | ICD-10-CM | POA: Diagnosis not present

## 2021-09-12 DIAGNOSIS — E8770 Fluid overload, unspecified: Secondary | ICD-10-CM | POA: Diagnosis not present

## 2021-09-12 DIAGNOSIS — E1169 Type 2 diabetes mellitus with other specified complication: Secondary | ICD-10-CM | POA: Diagnosis present

## 2021-09-12 DIAGNOSIS — E669 Obesity, unspecified: Secondary | ICD-10-CM | POA: Diagnosis present

## 2021-09-12 DIAGNOSIS — I493 Ventricular premature depolarization: Secondary | ICD-10-CM | POA: Diagnosis not present

## 2021-09-12 DIAGNOSIS — I25119 Atherosclerotic heart disease of native coronary artery with unspecified angina pectoris: Secondary | ICD-10-CM

## 2021-09-12 DIAGNOSIS — Z955 Presence of coronary angioplasty implant and graft: Secondary | ICD-10-CM

## 2021-09-12 DIAGNOSIS — Z8673 Personal history of transient ischemic attack (TIA), and cerebral infarction without residual deficits: Secondary | ICD-10-CM

## 2021-09-12 DIAGNOSIS — I10 Essential (primary) hypertension: Secondary | ICD-10-CM

## 2021-09-12 DIAGNOSIS — E1122 Type 2 diabetes mellitus with diabetic chronic kidney disease: Secondary | ICD-10-CM | POA: Diagnosis present

## 2021-09-12 DIAGNOSIS — E7800 Pure hypercholesterolemia, unspecified: Secondary | ICD-10-CM | POA: Diagnosis present

## 2021-09-12 HISTORY — PX: TEE WITHOUT CARDIOVERSION: SHX5443

## 2021-09-12 HISTORY — PX: CORONARY ARTERY BYPASS GRAFT: SHX141

## 2021-09-12 LAB — POCT I-STAT, CHEM 8
BUN: 20 mg/dL (ref 8–23)
BUN: 18 mg/dL (ref 8–23)
BUN: 19 mg/dL (ref 8–23)
BUN: 19 mg/dL (ref 8–23)
BUN: 19 mg/dL (ref 8–23)
BUN: 17 mg/dL (ref 8–23)
BUN: 16 mg/dL (ref 8–23)
Calcium, Ion: 1.19 mmol/L (ref 1.15–1.40)
Calcium, Ion: 1.05 mmol/L — ABNORMAL LOW (ref 1.15–1.40)
Calcium, Ion: 1.13 mmol/L — ABNORMAL LOW (ref 1.15–1.40)
Calcium, Ion: 1.06 mmol/L — ABNORMAL LOW (ref 1.15–1.40)
Calcium, Ion: 1.05 mmol/L — ABNORMAL LOW (ref 1.15–1.40)
Calcium, Ion: 1.07 mmol/L — ABNORMAL LOW (ref 1.15–1.40)
Calcium, Ion: 1.08 mmol/L — ABNORMAL LOW (ref 1.15–1.40)
Chloride: 101 mmol/L (ref 98–111)
Chloride: 101 mmol/L (ref 98–111)
Chloride: 104 mmol/L (ref 98–111)
Chloride: 100 mmol/L (ref 98–111)
Chloride: 101 mmol/L (ref 98–111)
Chloride: 103 mmol/L (ref 98–111)
Chloride: 104 mmol/L (ref 98–111)
Creatinine, Ser: 1.6 mg/dL — ABNORMAL HIGH (ref 0.61–1.24)
Creatinine, Ser: 1.4 mg/dL — ABNORMAL HIGH (ref 0.61–1.24)
Creatinine, Ser: 1.5 mg/dL — ABNORMAL HIGH (ref 0.61–1.24)
Creatinine, Ser: 1.4 mg/dL — ABNORMAL HIGH (ref 0.61–1.24)
Creatinine, Ser: 1.3 mg/dL — ABNORMAL HIGH (ref 0.61–1.24)
Creatinine, Ser: 1.3 mg/dL — ABNORMAL HIGH (ref 0.61–1.24)
Creatinine, Ser: 1.3 mg/dL — ABNORMAL HIGH (ref 0.61–1.24)
Glucose, Bld: 89 mg/dL (ref 70–99)
Glucose, Bld: 100 mg/dL — ABNORMAL HIGH (ref 70–99)
Glucose, Bld: 78 mg/dL (ref 70–99)
Glucose, Bld: 133 mg/dL — ABNORMAL HIGH (ref 70–99)
Glucose, Bld: 140 mg/dL — ABNORMAL HIGH (ref 70–99)
Glucose, Bld: 152 mg/dL — ABNORMAL HIGH (ref 70–99)
Glucose, Bld: 139 mg/dL — ABNORMAL HIGH (ref 70–99)
HCT: 41 % (ref 39.0–52.0)
HCT: 32 % — ABNORMAL LOW (ref 39.0–52.0)
HCT: 36 % — ABNORMAL LOW (ref 39.0–52.0)
HCT: 33 % — ABNORMAL LOW (ref 39.0–52.0)
HCT: 33 % — ABNORMAL LOW (ref 39.0–52.0)
HCT: 32 % — ABNORMAL LOW (ref 39.0–52.0)
HCT: 39 % (ref 39.0–52.0)
Hemoglobin: 13.9 g/dL (ref 13.0–17.0)
Hemoglobin: 10.9 g/dL — ABNORMAL LOW (ref 13.0–17.0)
Hemoglobin: 12.2 g/dL — ABNORMAL LOW (ref 13.0–17.0)
Hemoglobin: 11.2 g/dL — ABNORMAL LOW (ref 13.0–17.0)
Hemoglobin: 11.2 g/dL — ABNORMAL LOW (ref 13.0–17.0)
Hemoglobin: 10.9 g/dL — ABNORMAL LOW (ref 13.0–17.0)
Hemoglobin: 13.3 g/dL (ref 13.0–17.0)
Potassium: 3 mmol/L — ABNORMAL LOW (ref 3.5–5.1)
Potassium: 3 mmol/L — ABNORMAL LOW (ref 3.5–5.1)
Potassium: 3.4 mmol/L — ABNORMAL LOW (ref 3.5–5.1)
Potassium: 3.1 mmol/L — ABNORMAL LOW (ref 3.5–5.1)
Potassium: 3.7 mmol/L (ref 3.5–5.1)
Potassium: 3.2 mmol/L — ABNORMAL LOW (ref 3.5–5.1)
Potassium: 3.4 mmol/L — ABNORMAL LOW (ref 3.5–5.1)
Sodium: 142 mmol/L (ref 135–145)
Sodium: 141 mmol/L (ref 135–145)
Sodium: 141 mmol/L (ref 135–145)
Sodium: 138 mmol/L (ref 135–145)
Sodium: 139 mmol/L (ref 135–145)
Sodium: 140 mmol/L (ref 135–145)
Sodium: 141 mmol/L (ref 135–145)
TCO2: 31 mmol/L (ref 22–32)
TCO2: 27 mmol/L (ref 22–32)
TCO2: 29 mmol/L (ref 22–32)
TCO2: 27 mmol/L (ref 22–32)
TCO2: 25 mmol/L (ref 22–32)
TCO2: 27 mmol/L (ref 22–32)
TCO2: 23 mmol/L (ref 22–32)

## 2021-09-12 LAB — PLATELET COUNT: Platelets: 189 10*3/uL (ref 150–400)

## 2021-09-12 LAB — POCT I-STAT 7, (LYTES, BLD GAS, ICA,H+H)
Acid-Base Excess: 7 mmol/L — ABNORMAL HIGH (ref 0.0–2.0)
Acid-Base Excess: 4 mmol/L — ABNORMAL HIGH (ref 0.0–2.0)
Acid-base deficit: 3 mmol/L — ABNORMAL HIGH (ref 0.0–2.0)
Acid-base deficit: 3 mmol/L — ABNORMAL HIGH (ref 0.0–2.0)
Bicarbonate: 22.6 mmol/L (ref 20.0–28.0)
Bicarbonate: 23.4 mmol/L (ref 20.0–28.0)
Bicarbonate: 28.8 mmol/L — ABNORMAL HIGH (ref 20.0–28.0)
Bicarbonate: 30.8 mmol/L — ABNORMAL HIGH (ref 20.0–28.0)
Calcium, Ion: 0.98 mmol/L — ABNORMAL LOW (ref 1.15–1.40)
Calcium, Ion: 1.06 mmol/L — ABNORMAL LOW (ref 1.15–1.40)
Calcium, Ion: 1.08 mmol/L — ABNORMAL LOW (ref 1.15–1.40)
Calcium, Ion: 1.22 mmol/L (ref 1.15–1.40)
HCT: 32 % — ABNORMAL LOW (ref 39.0–52.0)
HCT: 33 % — ABNORMAL LOW (ref 39.0–52.0)
HCT: 37 % — ABNORMAL LOW (ref 39.0–52.0)
HCT: 38 % — ABNORMAL LOW (ref 39.0–52.0)
Hemoglobin: 10.9 g/dL — ABNORMAL LOW (ref 13.0–17.0)
Hemoglobin: 11.2 g/dL — ABNORMAL LOW (ref 13.0–17.0)
Hemoglobin: 12.6 g/dL — ABNORMAL LOW (ref 13.0–17.0)
Hemoglobin: 12.9 g/dL — ABNORMAL LOW (ref 13.0–17.0)
O2 Saturation: 100 %
O2 Saturation: 100 %
O2 Saturation: 93 %
O2 Saturation: 96 %
Patient temperature: 37.4
Potassium: 3.2 mmol/L — ABNORMAL LOW (ref 3.5–5.1)
Potassium: 3.2 mmol/L — ABNORMAL LOW (ref 3.5–5.1)
Potassium: 3.4 mmol/L — ABNORMAL LOW (ref 3.5–5.1)
Potassium: 4.2 mmol/L (ref 3.5–5.1)
Sodium: 139 mmol/L (ref 135–145)
Sodium: 141 mmol/L (ref 135–145)
Sodium: 141 mmol/L (ref 135–145)
Sodium: 142 mmol/L (ref 135–145)
TCO2: 24 mmol/L (ref 22–32)
TCO2: 25 mmol/L (ref 22–32)
TCO2: 30 mmol/L (ref 22–32)
TCO2: 32 mmol/L (ref 22–32)
pCO2 arterial: 40 mmHg (ref 32–48)
pCO2 arterial: 42.7 mmHg (ref 32–48)
pCO2 arterial: 43.5 mmHg (ref 32–48)
pCO2 arterial: 43.7 mmHg (ref 32–48)
pH, Arterial: 7.334 — ABNORMAL LOW (ref 7.35–7.45)
pH, Arterial: 7.336 — ABNORMAL LOW (ref 7.35–7.45)
pH, Arterial: 7.429 (ref 7.35–7.45)
pH, Arterial: 7.494 — ABNORMAL HIGH (ref 7.35–7.45)
pO2, Arterial: 365 mmHg — ABNORMAL HIGH (ref 83–108)
pO2, Arterial: 390 mmHg — ABNORMAL HIGH (ref 83–108)
pO2, Arterial: 74 mmHg — ABNORMAL LOW (ref 83–108)
pO2, Arterial: 86 mmHg (ref 83–108)

## 2021-09-12 LAB — GLUCOSE, CAPILLARY
Glucose-Capillary: 113 mg/dL — ABNORMAL HIGH (ref 70–99)
Glucose-Capillary: 138 mg/dL — ABNORMAL HIGH (ref 70–99)
Glucose-Capillary: 137 mg/dL — ABNORMAL HIGH (ref 70–99)
Glucose-Capillary: 132 mg/dL — ABNORMAL HIGH (ref 70–99)
Glucose-Capillary: 108 mg/dL — ABNORMAL HIGH (ref 70–99)
Glucose-Capillary: 139 mg/dL — ABNORMAL HIGH (ref 70–99)
Glucose-Capillary: 129 mg/dL — ABNORMAL HIGH (ref 70–99)

## 2021-09-12 LAB — CBC
HCT: 37.5 % — ABNORMAL LOW (ref 39.0–52.0)
HCT: 37.7 % — ABNORMAL LOW (ref 39.0–52.0)
Hemoglobin: 12.4 g/dL — ABNORMAL LOW (ref 13.0–17.0)
Hemoglobin: 12.5 g/dL — ABNORMAL LOW (ref 13.0–17.0)
MCH: 28.8 pg (ref 26.0–34.0)
MCH: 29 pg (ref 26.0–34.0)
MCHC: 33.1 g/dL (ref 30.0–36.0)
MCHC: 33.2 g/dL (ref 30.0–36.0)
MCV: 87 fL (ref 80.0–100.0)
MCV: 87.5 fL (ref 80.0–100.0)
Platelets: 185 10*3/uL (ref 150–400)
Platelets: 185 10*3/uL (ref 150–400)
RBC: 4.31 MIL/uL (ref 4.22–5.81)
RBC: 4.31 MIL/uL (ref 4.22–5.81)
RDW: 15 % (ref 11.5–15.5)
RDW: 15.3 % (ref 11.5–15.5)
WBC: 16.4 10*3/uL — ABNORMAL HIGH (ref 4.0–10.5)
WBC: 13.7 10*3/uL — ABNORMAL HIGH (ref 4.0–10.5)
nRBC: 0 % (ref 0.0–0.2)
nRBC: 0 % (ref 0.0–0.2)

## 2021-09-12 LAB — BASIC METABOLIC PANEL
Anion gap: 7 (ref 5–15)
BUN: 17 mg/dL (ref 8–23)
CO2: 25 mmol/L (ref 22–32)
Calcium: 8.5 mg/dL — ABNORMAL LOW (ref 8.9–10.3)
Chloride: 108 mmol/L (ref 98–111)
Creatinine, Ser: 1.4 mg/dL — ABNORMAL HIGH (ref 0.61–1.24)
GFR, Estimated: 57 mL/min — ABNORMAL LOW (ref >60)
Glucose, Bld: 129 mg/dL — ABNORMAL HIGH (ref 70–99)
Potassium: 4.3 mmol/L (ref 3.5–5.1)
Sodium: 140 mmol/L (ref 135–145)

## 2021-09-12 LAB — POCT I-STAT EG7
Acid-Base Excess: 5 mmol/L — ABNORMAL HIGH (ref 0.0–2.0)
Bicarbonate: 28.6 mmol/L — ABNORMAL HIGH (ref 20.0–28.0)
Calcium, Ion: 1.01 mmol/L — ABNORMAL LOW (ref 1.15–1.40)
HCT: 31 % — ABNORMAL LOW (ref 39.0–52.0)
Hemoglobin: 10.5 g/dL — ABNORMAL LOW (ref 13.0–17.0)
O2 Saturation: 82 %
Potassium: 3 mmol/L — ABNORMAL LOW (ref 3.5–5.1)
Sodium: 141 mmol/L (ref 135–145)
TCO2: 30 mmol/L (ref 22–32)
pCO2, Ven: 38.5 mmHg — ABNORMAL LOW (ref 44–60)
pH, Ven: 7.478 — ABNORMAL HIGH (ref 7.25–7.43)
pO2, Ven: 43 mmHg (ref 32–45)

## 2021-09-12 LAB — MAGNESIUM: Magnesium: 3 mg/dL — ABNORMAL HIGH (ref 1.7–2.4)

## 2021-09-12 LAB — HEMOGLOBIN AND HEMATOCRIT, BLOOD
HCT: 33.5 % — ABNORMAL LOW (ref 39.0–52.0)
Hemoglobin: 11.5 g/dL — ABNORMAL LOW (ref 13.0–17.0)

## 2021-09-12 LAB — APTT: aPTT: 32 seconds (ref 24–36)

## 2021-09-12 LAB — PROTIME-INR
INR: 1.3 — ABNORMAL HIGH (ref 0.8–1.2)
Prothrombin Time: 15.6 seconds — ABNORMAL HIGH (ref 11.4–15.2)

## 2021-09-12 SURGERY — CORONARY ARTERY BYPASS GRAFTING (CABG)
Anesthesia: General | Site: Chest

## 2021-09-12 MED ORDER — DOPAMINE-DEXTROSE 3.2-5 MG/ML-% IV SOLN
2.0000 ug/kg/min | INTRAVENOUS | Status: DC
  Filled 2021-09-12 (×2): qty 250

## 2021-09-12 MED ORDER — ACETAMINOPHEN 160 MG/5ML PO SOLN
1000.0000 mg | Freq: Four times a day (QID) | ORAL | Status: AC
  Administered 2021-09-12: 1000 mg
  Filled 2021-09-12: qty 40.6

## 2021-09-12 MED ORDER — PHENYLEPHRINE 80 MCG/ML (10ML) SYRINGE FOR IV PUSH (FOR BLOOD PRESSURE SUPPORT)
PREFILLED_SYRINGE | INTRAVENOUS | Status: AC
  Filled 2021-09-12: qty 10

## 2021-09-12 MED ORDER — FAMOTIDINE IN NACL 20-0.9 MG/50ML-% IV SOLN
INTRAVENOUS | Status: AC
  Filled 2021-09-12: qty 50

## 2021-09-12 MED ORDER — OXYCODONE HCL 5 MG PO TABS: 5.0000 mg | ORAL_TABLET | ORAL | Status: DC | PRN

## 2021-09-12 MED ORDER — LACTATED RINGERS IV SOLN
INTRAVENOUS | Status: DC
Start: 2021-09-12 — End: 2021-09-16

## 2021-09-12 MED ORDER — BISACODYL 10 MG RE SUPP: 10.0000 mg | Freq: Every day | RECTAL | Status: DC

## 2021-09-12 MED ORDER — SODIUM CHLORIDE 0.45 % IV SOLN: INTRAVENOUS | Status: DC | PRN

## 2021-09-12 MED ORDER — POTASSIUM CHLORIDE 10 MEQ/50ML IV SOLN
10.0000 meq | INTRAVENOUS | Status: AC
  Administered 2021-09-12 (×3): 10 meq via INTRAVENOUS

## 2021-09-12 MED ORDER — PROPOFOL 10 MG/ML IV BOLUS
INTRAVENOUS | Status: DC | PRN
  Administered 2021-09-12: 200 mg via INTRAVENOUS

## 2021-09-12 MED ORDER — HEPARIN SODIUM (PORCINE) 1000 UNIT/ML IJ SOLN
INTRAMUSCULAR | Status: AC
Start: 2021-09-12 — End: ?
  Filled 2021-09-12: qty 1

## 2021-09-12 MED ORDER — ~~LOC~~ CARDIAC SURGERY, PATIENT & FAMILY EDUCATION
Freq: Once | Status: DC
  Filled 2021-09-12: qty 1

## 2021-09-12 MED ORDER — ONDANSETRON HCL 4 MG/2ML IJ SOLN
4.0000 mg | Freq: Four times a day (QID) | INTRAMUSCULAR | Status: DC | PRN
  Administered 2021-09-13: 4 mg via INTRAVENOUS
  Filled 2021-09-12: qty 2

## 2021-09-12 MED ORDER — MAGNESIUM SULFATE 4 GM/100ML IV SOLN
4.0000 g | Freq: Once | INTRAVENOUS | Status: AC
  Administered 2021-09-12: 4 g via INTRAVENOUS
  Filled 2021-09-12: qty 100

## 2021-09-12 MED ORDER — ACETAMINOPHEN 160 MG/5ML PO SOLN: 650.0000 mg | Freq: Once | ORAL | Status: AC

## 2021-09-12 MED ORDER — LACTATED RINGERS IV SOLN: INTRAVENOUS | Status: DC | PRN

## 2021-09-12 MED ORDER — SODIUM CHLORIDE 0.9 % IV SOLN: 250.0000 mL | INTRAVENOUS | Status: DC

## 2021-09-12 MED ORDER — DOCUSATE SODIUM 100 MG PO CAPS
200.0000 mg | ORAL_CAPSULE | Freq: Every day | ORAL | Status: DC
  Administered 2021-09-13 – 2021-09-16 (×4): 200 mg via ORAL
  Filled 2021-09-12 (×4): qty 2

## 2021-09-12 MED ORDER — CHLORHEXIDINE GLUCONATE 0.12 % MT SOLN: 15.0000 mL | Freq: Once | OROMUCOSAL | Status: DC

## 2021-09-12 MED ORDER — FENTANYL CITRATE (PF) 250 MCG/5ML IJ SOLN
INTRAMUSCULAR | Status: AC
  Filled 2021-09-12: qty 5

## 2021-09-12 MED ORDER — MORPHINE SULFATE (PF) 2 MG/ML IV SOLN
1.0000 mg | INTRAVENOUS | Status: DC | PRN
  Administered 2021-09-12 – 2021-09-13 (×5): 2 mg via INTRAVENOUS
  Filled 2021-09-12 (×5): qty 1

## 2021-09-12 MED ORDER — DEXMEDETOMIDINE HCL IN NACL 400 MCG/100ML IV SOLN
0.0000 ug/kg/h | INTRAVENOUS | Status: DC
  Administered 2021-09-12: 0.3 ug/kg/h via INTRAVENOUS
  Filled 2021-09-12: qty 100

## 2021-09-12 MED ORDER — FENTANYL CITRATE (PF) 250 MCG/5ML IJ SOLN
INTRAMUSCULAR | Status: DC | PRN
  Administered 2021-09-12: 500 ug via INTRAVENOUS
  Administered 2021-09-12: 100 ug via INTRAVENOUS
  Administered 2021-09-12: 50 ug via INTRAVENOUS
  Administered 2021-09-12: 100 ug via INTRAVENOUS
  Administered 2021-09-12: 50 ug via INTRAVENOUS
  Administered 2021-09-12: 450 ug via INTRAVENOUS

## 2021-09-12 MED ORDER — LACTATED RINGERS IV SOLN
INTRAVENOUS | Status: DC
Start: 2021-09-12 — End: 2021-09-12

## 2021-09-12 MED ORDER — HEPARIN SODIUM (PORCINE) 1000 UNIT/ML IJ SOLN
INTRAMUSCULAR | Status: DC | PRN
  Administered 2021-09-12: 34000 [IU] via INTRAVENOUS

## 2021-09-12 MED ORDER — PHENYLEPHRINE HCL-NACL 20-0.9 MG/250ML-% IV SOLN: 0.0000 ug/min | INTRAVENOUS | Status: DC

## 2021-09-12 MED ORDER — METOPROLOL TARTRATE 12.5 MG HALF TABLET
12.5000 mg | ORAL_TABLET | Freq: Two times a day (BID) | ORAL | Status: DC
  Administered 2021-09-13 – 2021-09-15 (×6): 12.5 mg via ORAL
  Filled 2021-09-12 (×6): qty 1

## 2021-09-12 MED ORDER — ASPIRIN 81 MG PO CHEW
324.0000 mg | CHEWABLE_TABLET | Freq: Every day | ORAL | Status: DC
  Administered 2021-09-14: 324 mg
  Filled 2021-09-12: qty 4

## 2021-09-12 MED ORDER — TRAMADOL HCL 50 MG PO TABS: 50.0000 mg | ORAL_TABLET | ORAL | Status: DC | PRN

## 2021-09-12 MED ORDER — THROMBIN (RECOMBINANT) 20000 UNITS EX SOLR
CUTANEOUS | Status: AC
  Filled 2021-09-12: qty 20000

## 2021-09-12 MED ORDER — ROCURONIUM BROMIDE 10 MG/ML (PF) SYRINGE
PREFILLED_SYRINGE | INTRAVENOUS | Status: DC | PRN
  Administered 2021-09-12: 100 mg via INTRAVENOUS
  Administered 2021-09-12: 50 mg via INTRAVENOUS
  Administered 2021-09-12: 60 mg via INTRAVENOUS
  Administered 2021-09-12: 40 mg via INTRAVENOUS
  Administered 2021-09-12: 50 mg via INTRAVENOUS

## 2021-09-12 MED ORDER — SODIUM CHLORIDE 0.9 % IV SOLN
INTRAVENOUS | Status: DC
Start: 2021-09-12 — End: 2021-09-16

## 2021-09-12 MED ORDER — HEMOSTATIC AGENTS (NO CHARGE) OPTIME
TOPICAL | Status: DC | PRN
  Administered 2021-09-12: 1 via TOPICAL

## 2021-09-12 MED ORDER — VANCOMYCIN HCL IN DEXTROSE 1-5 GM/200ML-% IV SOLN
1000.0000 mg | Freq: Once | INTRAVENOUS | Status: AC
  Administered 2021-09-12: 1000 mg via INTRAVENOUS
  Filled 2021-09-12: qty 200

## 2021-09-12 MED ORDER — ARTIFICIAL TEARS OPHTHALMIC OINT
TOPICAL_OINTMENT | OPHTHALMIC | Status: AC
  Filled 2021-09-12: qty 3.5

## 2021-09-12 MED ORDER — CHLORHEXIDINE GLUCONATE 0.12 % MT SOLN: 15.0000 mL | Freq: Once | OROMUCOSAL | Status: AC

## 2021-09-12 MED ORDER — CEFAZOLIN SODIUM-DEXTROSE 2-4 GM/100ML-% IV SOLN
2.0000 g | Freq: Three times a day (TID) | INTRAVENOUS | Status: AC
  Administered 2021-09-12 – 2021-09-14 (×6): 2 g via INTRAVENOUS
  Filled 2021-09-12 (×6): qty 100

## 2021-09-12 MED ORDER — MIDAZOLAM HCL (PF) 10 MG/2ML IJ SOLN
INTRAMUSCULAR | Status: AC
  Filled 2021-09-12: qty 2

## 2021-09-12 MED ORDER — METOPROLOL TARTRATE 25 MG/10 ML ORAL SUSPENSION
12.5000 mg | Freq: Two times a day (BID) | ORAL | Status: DC
  Filled 2021-09-12: qty 10

## 2021-09-12 MED ORDER — PROTAMINE SULFATE 10 MG/ML IV SOLN
INTRAVENOUS | Status: DC | PRN
  Administered 2021-09-12: 300 mg via INTRAVENOUS

## 2021-09-12 MED ORDER — CHLORHEXIDINE GLUCONATE 0.12 % MT SOLN
OROMUCOSAL | Status: AC
  Administered 2021-09-12: 15 mL via OROMUCOSAL
  Filled 2021-09-12: qty 15

## 2021-09-12 MED ORDER — ACETAMINOPHEN 500 MG PO TABS
1000.0000 mg | ORAL_TABLET | Freq: Four times a day (QID) | ORAL | Status: AC
  Administered 2021-09-13 – 2021-09-17 (×19): 1000 mg via ORAL
  Filled 2021-09-12 (×19): qty 2

## 2021-09-12 MED ORDER — CHLORHEXIDINE GLUCONATE 0.12 % MT SOLN
15.0000 mL | OROMUCOSAL | Status: AC
  Administered 2021-09-12: 15 mL via OROMUCOSAL

## 2021-09-12 MED ORDER — 0.9 % SODIUM CHLORIDE (POUR BTL) OPTIME
TOPICAL | Status: DC | PRN
  Administered 2021-09-12: 5000 mL

## 2021-09-12 MED ORDER — PROPOFOL 10 MG/ML IV BOLUS
INTRAVENOUS | Status: AC
  Filled 2021-09-12: qty 20

## 2021-09-12 MED ORDER — ORAL CARE MOUTH RINSE: 15.0000 mL | Freq: Once | OROMUCOSAL | Status: AC

## 2021-09-12 MED ORDER — ATORVASTATIN CALCIUM 80 MG PO TABS
80.0000 mg | ORAL_TABLET | Freq: Every day | ORAL | Status: DC
  Administered 2021-09-12 – 2021-09-20 (×9): 80 mg via ORAL
  Filled 2021-09-12 (×9): qty 1

## 2021-09-12 MED ORDER — HEPARIN SODIUM (PORCINE) 1000 UNIT/ML IJ SOLN
INTRAMUSCULAR | Status: AC
  Filled 2021-09-12: qty 10

## 2021-09-12 MED ORDER — LACTATED RINGERS IV SOLN: 500.0000 mL | Freq: Once | INTRAVENOUS | Status: DC | PRN

## 2021-09-12 MED ORDER — SODIUM CHLORIDE 0.9% FLUSH: 3.0000 mL | INTRAVENOUS | Status: DC | PRN

## 2021-09-12 MED ORDER — ACETAMINOPHEN 650 MG RE SUPP
650.0000 mg | Freq: Once | RECTAL | Status: AC
  Administered 2021-09-12: 650 mg via RECTAL
  Filled 2021-09-12: qty 1

## 2021-09-12 MED ORDER — MIDAZOLAM HCL (PF) 5 MG/ML IJ SOLN
INTRAMUSCULAR | Status: DC | PRN
  Administered 2021-09-12: 5 mg via INTRAVENOUS
  Administered 2021-09-12: 2 mg via INTRAVENOUS
  Administered 2021-09-12: 3 mg via INTRAVENOUS

## 2021-09-12 MED ORDER — LACTATED RINGERS IV SOLN: INTRAVENOUS | Status: DC

## 2021-09-12 MED ORDER — METOPROLOL TARTRATE 5 MG/5ML IV SOLN: 2.5000 mg | INTRAVENOUS | Status: DC | PRN

## 2021-09-12 MED ORDER — ALBUMIN HUMAN 5 % IV SOLN
250.0000 mL | INTRAVENOUS | Status: AC | PRN
  Administered 2021-09-12 – 2021-09-13 (×4): 12.5 g via INTRAVENOUS
  Filled 2021-09-12 (×2): qty 250

## 2021-09-12 MED ORDER — FAMOTIDINE IN NACL 20-0.9 MG/50ML-% IV SOLN
20.0000 mg | Freq: Two times a day (BID) | INTRAVENOUS | Status: AC
  Administered 2021-09-12 (×2): 20 mg via INTRAVENOUS
  Filled 2021-09-12 (×2): qty 50

## 2021-09-12 MED ORDER — PROTAMINE SULFATE 10 MG/ML IV SOLN
INTRAVENOUS | Status: AC
  Filled 2021-09-12: qty 10

## 2021-09-12 MED ORDER — ASPIRIN 325 MG PO TBEC
325.0000 mg | DELAYED_RELEASE_TABLET | Freq: Every day | ORAL | Status: DC
Start: 2021-09-13 — End: 2021-09-16
  Administered 2021-09-13 – 2021-09-16 (×3): 325 mg via ORAL
  Filled 2021-09-12 (×4): qty 1

## 2021-09-12 MED ORDER — METOPROLOL TARTRATE 12.5 MG HALF TABLET: 12.5000 mg | ORAL_TABLET | Freq: Once | ORAL | Status: DC

## 2021-09-12 MED ORDER — PLASMA-LYTE A IV SOLN
INTRAVENOUS | Status: DC | PRN
  Administered 2021-09-12: 500 mL via INTRAVASCULAR

## 2021-09-12 MED ORDER — ROCURONIUM BROMIDE 10 MG/ML (PF) SYRINGE
PREFILLED_SYRINGE | INTRAVENOUS | Status: AC
  Filled 2021-09-12: qty 30

## 2021-09-12 MED ORDER — DEXTROSE 50 % IV SOLN: 0.0000 mL | INTRAVENOUS | Status: DC | PRN

## 2021-09-12 MED ORDER — BISACODYL 5 MG PO TBEC
10.0000 mg | DELAYED_RELEASE_TABLET | Freq: Every day | ORAL | Status: DC
  Administered 2021-09-13 – 2021-09-16 (×4): 10 mg via ORAL
  Filled 2021-09-12 (×4): qty 2

## 2021-09-12 MED ORDER — THROMBIN 20000 UNITS EX SOLR
CUTANEOUS | Status: DC | PRN
  Administered 2021-09-12 (×3): 4 mL via TOPICAL

## 2021-09-12 MED ORDER — SODIUM CHLORIDE 0.9 % IV SOLN: INTRAVENOUS | Status: DC | PRN

## 2021-09-12 MED ORDER — PROTAMINE SULFATE 10 MG/ML IV SOLN
INTRAVENOUS | Status: AC
  Filled 2021-09-12: qty 25

## 2021-09-12 MED ORDER — NITROGLYCERIN IN D5W 200-5 MCG/ML-% IV SOLN: 0.0000 ug/min | INTRAVENOUS | Status: DC

## 2021-09-12 MED ORDER — SODIUM CHLORIDE (PF) 0.9 % IJ SOLN
INTRAMUSCULAR | Status: AC
  Filled 2021-09-12: qty 10

## 2021-09-12 MED ORDER — INSULIN REGULAR(HUMAN) IN NACL 100-0.9 UT/100ML-% IV SOLN
INTRAVENOUS | Status: DC
Start: 2021-09-12 — End: 2021-09-13

## 2021-09-12 MED ORDER — PANTOPRAZOLE SODIUM 40 MG PO TBEC
40.0000 mg | DELAYED_RELEASE_TABLET | Freq: Every day | ORAL | Status: DC
  Administered 2021-09-14 – 2021-09-20 (×7): 40 mg via ORAL
  Filled 2021-09-12 (×7): qty 1

## 2021-09-12 MED ORDER — DOPAMINE-DEXTROSE 3.2-5 MG/ML-% IV SOLN
INTRAVENOUS | Status: DC | PRN
  Administered 2021-09-12: 2 ug/kg/min via INTRAVENOUS

## 2021-09-12 MED ORDER — CHLORHEXIDINE GLUCONATE 4 % EX LIQD: 30.0000 mL | CUTANEOUS | Status: DC

## 2021-09-12 MED ORDER — ARTIFICIAL TEARS OPHTHALMIC OINT
TOPICAL_OINTMENT | OPHTHALMIC | Status: DC | PRN
  Administered 2021-09-12: 1 via OPHTHALMIC

## 2021-09-12 MED ORDER — MIDAZOLAM HCL 2 MG/2ML IJ SOLN
2.0000 mg | INTRAMUSCULAR | Status: DC | PRN
  Administered 2021-09-12: 2 mg via INTRAVENOUS
  Filled 2021-09-12: qty 2

## 2021-09-12 MED ORDER — SODIUM CHLORIDE 0.9% FLUSH
3.0000 mL | Freq: Two times a day (BID) | INTRAVENOUS | Status: DC
  Administered 2021-09-13 – 2021-09-15 (×4): 3 mL via INTRAVENOUS

## 2021-09-12 SURGICAL SUPPLY — 106 items
BAG DECANTER FOR FLEXI CONT (MISCELLANEOUS) ×3 IMPLANT
BLADE CLIPPER SURG (BLADE) ×3 IMPLANT
BLADE STERNUM SYSTEM 6 (BLADE) ×3 IMPLANT
BNDG ELASTIC 4X5.8 VLCR STR LF (GAUZE/BANDAGES/DRESSINGS) ×3 IMPLANT
BNDG ELASTIC 6X10 VLCR STRL LF (GAUZE/BANDAGES/DRESSINGS) ×1 IMPLANT
BNDG ELASTIC 6X5.8 VLCR STR LF (GAUZE/BANDAGES/DRESSINGS) ×3 IMPLANT
BNDG GAUZE DERMACEA FLUFF (GAUZE/BANDAGES/DRESSINGS) ×1
BNDG GAUZE DERMACEA FLUFF 4 (GAUZE/BANDAGES/DRESSINGS) IMPLANT
BNDG GAUZE ELAST 4 BULKY (GAUZE/BANDAGES/DRESSINGS) ×3 IMPLANT
CANISTER SUCT 3000ML PPV (MISCELLANEOUS) ×3 IMPLANT
CATH ROBINSON RED A/P 18FR (CATHETERS) ×6 IMPLANT
CATH THORACIC 28FR (CATHETERS) ×3 IMPLANT
CATH THORACIC 36FR (CATHETERS) ×3 IMPLANT
CATH THORACIC 36FR RT ANG (CATHETERS) ×3 IMPLANT
CLIP VESOCCLUDE MED 24/CT (CLIP) IMPLANT
CLIP VESOCCLUDE SM WIDE 24/CT (CLIP) ×1 IMPLANT
CONTAINER PROTECT SURGISLUSH (MISCELLANEOUS) ×6 IMPLANT
DERMABOND ADVANCED (GAUZE/BANDAGES/DRESSINGS) ×1
DERMABOND ADVANCED .7 DNX12 (GAUZE/BANDAGES/DRESSINGS) IMPLANT
DRAPE CARDIOVASCULAR INCISE (DRAPES) ×3
DRAPE SRG 135X102X78XABS (DRAPES) ×2 IMPLANT
DRAPE WARM FLUID 44X44 (DRAPES) ×3 IMPLANT
DRSG COVADERM 4X14 (GAUZE/BANDAGES/DRESSINGS) ×3 IMPLANT
ELECT BLADE 4.0 EZ CLEAN MEGAD (MISCELLANEOUS) ×3
ELECT CAUTERY BLADE 6.4 (BLADE) ×3 IMPLANT
ELECT REM PT RETURN 9FT ADLT (ELECTROSURGICAL) ×6
ELECTRODE BLDE 4.0 EZ CLN MEGD (MISCELLANEOUS) IMPLANT
ELECTRODE REM PT RTRN 9FT ADLT (ELECTROSURGICAL) ×4 IMPLANT
FELT TEFLON 1X6 (MISCELLANEOUS) ×5 IMPLANT
GAUZE 4X4 16PLY ~~LOC~~+RFID DBL (SPONGE) ×2 IMPLANT
GAUZE SPONGE 4X4 12PLY STRL (GAUZE/BANDAGES/DRESSINGS) ×6 IMPLANT
GLOVE BIO SURGEON STRL SZ 6 (GLOVE) IMPLANT
GLOVE BIO SURGEON STRL SZ 6.5 (GLOVE) IMPLANT
GLOVE BIO SURGEON STRL SZ7 (GLOVE) IMPLANT
GLOVE BIO SURGEON STRL SZ7.5 (GLOVE) IMPLANT
GLOVE BIOGEL PI IND STRL 6 (GLOVE) IMPLANT
GLOVE BIOGEL PI IND STRL 6.5 (GLOVE) IMPLANT
GLOVE BIOGEL PI IND STRL 7.0 (GLOVE) IMPLANT
GLOVE BIOGEL PI INDICATOR 6 (GLOVE)
GLOVE BIOGEL PI INDICATOR 6.5 (GLOVE)
GLOVE BIOGEL PI INDICATOR 7.0 (GLOVE)
GLOVE ORTHO TXT STRL SZ7.5 (GLOVE) IMPLANT
GLOVE SURG MICRO LTX SZ7 (GLOVE) ×6 IMPLANT
GOWN STRL REUS W/ TWL LRG LVL3 (GOWN DISPOSABLE) ×8 IMPLANT
GOWN STRL REUS W/ TWL XL LVL3 (GOWN DISPOSABLE) ×2 IMPLANT
GOWN STRL REUS W/TWL LRG LVL3 (GOWN DISPOSABLE) ×36
GOWN STRL REUS W/TWL XL LVL3 (GOWN DISPOSABLE) ×3
HEMOSTAT POWDER SURGIFOAM 1G (HEMOSTASIS) ×9 IMPLANT
HEMOSTAT SURGICEL 2X14 (HEMOSTASIS) ×3 IMPLANT
INSERT FOGARTY 61MM (MISCELLANEOUS) IMPLANT
INSERT FOGARTY XLG (MISCELLANEOUS) IMPLANT
KIT BASIN OR (CUSTOM PROCEDURE TRAY) ×3 IMPLANT
KIT CATH CPB BARTLE (MISCELLANEOUS) ×3 IMPLANT
KIT SUCTION CATH 14FR (SUCTIONS) ×3 IMPLANT
KIT TURNOVER KIT B (KITS) ×3 IMPLANT
KIT VASOVIEW HEMOPRO 2 VH 4000 (KITS) ×3 IMPLANT
NS IRRIG 1000ML POUR BTL (IV SOLUTION) ×15 IMPLANT
PACK E OPEN HEART (SUTURE) ×3 IMPLANT
PACK OPEN HEART (CUSTOM PROCEDURE TRAY) ×3 IMPLANT
PAD ARMBOARD 7.5X6 YLW CONV (MISCELLANEOUS) ×6 IMPLANT
PAD ELECT DEFIB RADIOL ZOLL (MISCELLANEOUS) ×3 IMPLANT
PENCIL BUTTON HOLSTER BLD 10FT (ELECTRODE) ×3 IMPLANT
POSITIONER HEAD DONUT 9IN (MISCELLANEOUS) ×3 IMPLANT
PUNCH AORTIC ROTATE 4.0MM (MISCELLANEOUS) ×1 IMPLANT
PUNCH AORTIC ROTATE 4.5MM 8IN (MISCELLANEOUS) ×3 IMPLANT
PUNCH AORTIC ROTATE 5MM 8IN (MISCELLANEOUS) IMPLANT
SEALANT SURG COSEAL 4ML (VASCULAR PRODUCTS) ×1 IMPLANT
SET MPS 3-ND DEL (MISCELLANEOUS) ×1 IMPLANT
SPONGE INTESTINAL PEANUT (DISPOSABLE) IMPLANT
SPONGE T-LAP 18X18 ~~LOC~~+RFID (SPONGE) ×8 IMPLANT
SPONGE T-LAP 4X18 ~~LOC~~+RFID (SPONGE) ×5 IMPLANT
SUPPORT HEART JANKE-BARRON (MISCELLANEOUS) ×3 IMPLANT
SUT BONE WAX W31G (SUTURE) ×3 IMPLANT
SUT MNCRL AB 4-0 PS2 18 (SUTURE) ×1 IMPLANT
SUT PROLENE 3 0 SH DA (SUTURE) IMPLANT
SUT PROLENE 3 0 SH1 36 (SUTURE) ×3 IMPLANT
SUT PROLENE 4 0 RB 1 (SUTURE) ×3
SUT PROLENE 4 0 SH DA (SUTURE) IMPLANT
SUT PROLENE 4-0 RB1 .5 CRCL 36 (SUTURE) IMPLANT
SUT PROLENE 5 0 C 1 36 (SUTURE) IMPLANT
SUT PROLENE 6 0 C 1 30 (SUTURE) ×3 IMPLANT
SUT PROLENE 7 0 BV 1 (SUTURE) IMPLANT
SUT PROLENE 7 0 BV1 MDA (SUTURE) ×3 IMPLANT
SUT PROLENE 8 0 BV175 6 (SUTURE) IMPLANT
SUT SILK  1 MH (SUTURE)
SUT SILK 1 MH (SUTURE) IMPLANT
SUT SILK 2 0 SH (SUTURE) IMPLANT
SUT STEEL STERNAL CCS#1 18IN (SUTURE) IMPLANT
SUT STEEL SZ 6 DBL 3X14 BALL (SUTURE) IMPLANT
SUT VIC AB 1 CTX 36 (SUTURE) ×6
SUT VIC AB 1 CTX36XBRD ANBCTR (SUTURE) ×4 IMPLANT
SUT VIC AB 2-0 CT1 27 (SUTURE) ×3
SUT VIC AB 2-0 CT1 TAPERPNT 27 (SUTURE) IMPLANT
SUT VIC AB 2-0 CTX 27 (SUTURE) IMPLANT
SUT VIC AB 3-0 SH 27 (SUTURE)
SUT VIC AB 3-0 SH 27X BRD (SUTURE) IMPLANT
SUT VIC AB 3-0 X1 27 (SUTURE) IMPLANT
SUT VICRYL 4-0 PS2 18IN ABS (SUTURE) IMPLANT
SYSTEM SAHARA CHEST DRAIN ATS (WOUND CARE) ×3 IMPLANT
TAPE CLOTH SURG 4X10 WHT LF (GAUZE/BANDAGES/DRESSINGS) ×1 IMPLANT
TOWEL GREEN STERILE (TOWEL DISPOSABLE) ×3 IMPLANT
TOWEL GREEN STERILE FF (TOWEL DISPOSABLE) ×3 IMPLANT
TRAY FOLEY SLVR 16FR TEMP STAT (SET/KITS/TRAYS/PACK) ×3 IMPLANT
TUBING LAP HI FLOW INSUFFLATIO (TUBING) ×3 IMPLANT
UNDERPAD 30X36 HEAVY ABSORB (UNDERPADS AND DIAPERS) ×3 IMPLANT
WATER STERILE IRR 1000ML POUR (IV SOLUTION) ×6 IMPLANT

## 2021-09-12 NOTE — Anesthesia Procedure Notes (Signed)
Arterial Line Insertion Start/End8/04/2021 7:00 AM, 09/12/2021 7:15 AM Performed by: Leonor Liv, CRNA, CRNA  Patient location: Pre-op. Preanesthetic checklist: patient identified, IV checked, site marked, risks and benefits discussed, surgical consent, monitors and equipment checked, pre-op evaluation, timeout performed and anesthesia consent Lidocaine 1% used for infiltration and patient sedated Left, radial was placed Catheter size: 20 G Hand hygiene performed  and maximum sterile barriers used  Allen's test indicative of satisfactory collateral circulation Attempts: 1 Procedure performed without using ultrasound guided technique. Following insertion, dressing applied and Biopatch. Patient tolerated the procedure well with no immediate complications.

## 2021-09-12 NOTE — Anesthesia Postprocedure Evaluation (Signed)
Anesthesia Post Note  Patient: [name]  Procedure(s) Performed: CORONARY ARTERY BYPASS GRAFTING (CABG) X 5 USING LEFT INTERNAL MAMMARY ARTERY AND ENDOSCOPICALLY HARVESTED RIGHT GREATER SAPHENOUS VEIN. (Chest) TRANSESOPHAGEAL ECHOCARDIOGRAM (TEE)     Patient location during evaluation: SICU Anesthesia Type: General Level of consciousness: sedated Pain management: pain level controlled Vital Signs Assessment: post-procedure vital signs reviewed and stable Respiratory status: patient remains intubated per anesthesia plan Cardiovascular status: stable Postop Assessment: no apparent nausea or vomiting Anesthetic complications: no   No notable events documented.  Last Vitals:  Vitals:   09/12/21 1745 09/12/21 1747  BP:    Pulse: 89 89  Resp: 20 20  Temp: 36.9 C 36.9 C  SpO2: 98% 98%    Last Pain:  Vitals:   09/12/21 1545  TempSrc: Core  PainSc:                  Karyl Kinnier Makenize Messman

## 2021-09-12 NOTE — Op Note (Signed)
CARDIOVASCULAR SURGERY OPERATIVE NOTE  09/12/2021  Surgeon:  [name], MD  First Assistant: Enid Cutter, [facility] and Wynelle Beckmann, PA-C:   An experienced assistant was required given the complexity of this surgery and the standard of surgical care. The assistant was needed for endoscopic vein harvest, exposure, dissection, suctioning, retraction of delicate tissues and sutures, instrument exchange and for overall help during this procedure.   Preoperative Diagnosis:  Severe multi-vessel coronary artery disease   Postoperative Diagnosis:  Same   Procedure:  Median Sternotomy Extracorporeal circulation 3.   Coronary artery bypass grafting x 5  Left internal mammary artery graft to the LAD SVG to diagonal Sequential SVG to OM1 and OM2 SVG to distal RCA 4.   Endoscopic vein harvest from the right leg   Anesthesia:  General Endotracheal   Clinical History/Surgical Indication:  He has severe three-vessel coronary disease with normal left ventricular function by prior echo.  I think coronary artery bypass graft surgery is the best treatment to prevent further ischemia and infarction and improve his symptoms.  His diabetes appears to be under much better control.I discussed the operative procedure with the patient including alternatives, benefits and risks; including but not limited to bleeding, blood transfusion, infection, stroke, myocardial infarction, graft failure, heart block requiring a permanent pacemaker, organ dysfunction, and death.  [name] understands and agrees to proceed.       Preparation:  The patient was seen in the preoperative holding area and the correct patient, correct operation were confirmed with the patient after reviewing the medical record and catheterization. The consent was signed by me. Preoperative antibiotics were given. A pulmonary arterial line and  radial arterial line were placed by the anesthesia team. The patient was taken back to the operating room and positioned supine on the operating room table. After being placed under general endotracheal anesthesia by the anesthesia team a foley catheter was placed. The neck, chest, abdomen, and both legs were prepped with betadine soap and solution and draped in the usual sterile manner. A surgical time-out was taken and the correct patient and operative procedure were confirmed with the nursing and anesthesia staff.   Cardiopulmonary Bypass:  A median sternotomy was performed. The pericardium was opened in the midline. Right ventricular function appeared normal. The ascending aorta was of normal size and had no palpable plaque. There were no contraindications to aortic cannulation or cross-clamping. The patient was fully systemically heparinized and the ACT was maintained > 400 sec. The proximal aortic arch was cannulated with a 20 F aortic cannula for arterial inflow. Venous cannulation was performed via the right atrial appendage using a two-staged venous cannula. An antegrade cardioplegia/vent cannula was inserted into the mid-ascending aorta. Aortic occlusion was performed with a single cross-clamp. Systemic cooling to 32 degrees Centigrade and topical cooling of the heart with iced saline were used. Hyperkalemic antegrade cold blood cardioplegia was used to induce diastolic arrest and was then given at about 20 minute intervals throughout the period of arrest to maintain myocardial temperature at or below 10 degrees centigrade. A temperature probe was inserted into the interventricular septum and an insulating pad was placed in the pericardium.   Left internal mammary artery harvest:  The left side of the sternum was retracted using the Rultract retractor. The left internal mammary artery was harvested as a pedicle graft. All side branches were clipped. It was a medium-sized vessel of good quality  with excellent blood flow. It was ligated distally and divided. It was  sprayed with topical papaverine solution to prevent vasospasm.   Endoscopic vein harvest:  The right greater saphenous vein was harvested endoscopically through a 2 cm incision medial to the right knee. It was harvested from the upper thigh to the lower leg. It was a medium-sized vein of good quality overall but the upper thigh portion became smaller due to multiple branching. The side branches were all ligated with 4-0 silk ties.    Coronary arteries:  The coronary arteries were examined.   LAD:  diffusely diseased with calcific plaque. There was only once small spot in the mid vessel that was soft enough to open. The D1 branch was diffusely diseased and not graftable. The D2 branch was heavily diseased proximally but the mid to distal vessel was only mildly diseased and graftable. LCX:  OM1 was visible proximally where it was heavily diseased with calcific plaque. It was intramyocardial throughout the mid portion and was visible at the surface distally where it was still a medium sized graftable vessel with no disease. The OM2 was also heavily diseased proximally with calcific plaque and the remainder of the vessel was intramyocardial. It was traced into the muscle where it was a large vessel with no disease. RCA:  diffusely diseased with calcific plaque. The PDA and PL branches were diffusely diseased and small and not graftable. The distal RCA just before the origin of the PDA was diseased but soft enough to open for grafting.   Grafts:  LIMA to the LAD: 2.0 mm. It was sewn end to side using 8-0 prolene continuous suture. SVG to diagonal 2:  1.5 mm. It was sewn end to side using 7-0 prolene continuous suture. Sequential SVG to OM2:  1.75 mm. It was sewn sequential side to side using 7-0 prolene continuous suture. Sequential SVG to OM1:  1.75 mm. It was sewn sequential end to side using 7-0 prolene continuous suture. 5.    SVG to distal RCA:  2.5 mm. It was sewn end to side using 7-0 prolene continuous suture.  All of the anastomoses were very tedious due to the diffuse nature of his coronary artery disease, intramyocardial OM branches and his hypertrophied ventricle. The proximal vein graft anastomoses were performed to the mid-ascending aorta using continuous 6-0 prolene suture. Graft markers were placed around the proximal anastomoses.   Completion:  The patient was rewarmed to 37 degrees Centigrade. The clamp was removed from the LIMA pedicle and there was rapid warming of the septum and return of ventricular fibrillation. The crossclamp was removed with a time of 137 minutes. There was spontaneous return of sinus rhythm. The distal and proximal anastomoses were checked for hemostasis. The position of the grafts was satisfactory. Two temporary epicardial pacing wires were placed on the right atrium and two on the right ventricle. The patient was weaned from CPB without difficulty on no inotropes. CPB time was 166 minutes. Cardiac output was 5 LPM. TEE showed unchanged LVEF of 60-65%. Heparin was fully reversed with protamine and the aortic and venous cannulas removed. Hemostasis was achieved. Mediastinal and left pleural drainage tubes were placed. The sternum was closed with double #6 stainless steel wires. The fascia was closed with continuous # 1 vicryl suture. The subcutaneous tissue was closed with 2-0 vicryl continuous suture. The skin was closed with 3-0 vicryl subcuticular suture. All sponge, needle, and instrument counts were reported correct at the end of the case. Dry sterile dressings were placed over the incisions and around the chest tubes which were  connected to pleurevac suction. The patient was then transported to the surgical intensive care unit in stable condition.

## 2021-09-12 NOTE — Progress Notes (Signed)
CT surgery PM rounds  Patient remains on ventilator postop multivessel CABG Heart rate 90, PAD 20, cardiac index 1.5 Dopamine increased to 4 mcg Postop labs-hemoglobin 12, creatinine 1.3, ABG normal Blood pressure 101/60, pulse 83, temperature 98.6 F (37 C), resp. rate 18, height 5\' 7"  (1.702 m), weight 102.1 kg, SpO2 97 %.

## 2021-09-12 NOTE — Transfer of Care (Signed)
Immediate Anesthesia Transfer of Care Note  Patient: [name]  Procedure(s) Performed: CORONARY ARTERY BYPASS GRAFTING (CABG) X 5 USING LEFT INTERNAL MAMMARY ARTERY AND ENDOSCOPICALLY HARVESTED RIGHT GREATER SAPHENOUS VEIN. (Chest) TRANSESOPHAGEAL ECHOCARDIOGRAM (TEE)  Patient Location: ICU  Anesthesia Type:General  Level of Consciousness: unresponsive and Patient remains intubated per anesthesia plan  Airway & Oxygen Therapy: Patient remains intubated per anesthesia plan and Patient placed on Ventilator (see vital sign flow sheet for setting)  Post-op Assessment: Report given to RN, Post -op Vital signs reviewed and stable and Patient moving all extremities  Post vital signs: Reviewed and stable  Last Vitals:  Vitals Value Taken Time  BP    Temp    Pulse 86 09/12/21 1541  Resp 24 09/12/21 1541  SpO2 95 % 09/12/21 1541  Vitals shown include unvalidated device data.  Last Pain:  Vitals:   09/12/21 0645  TempSrc:   PainSc: 0-No pain         Complications: No notable events documented.

## 2021-09-12 NOTE — Brief Op Note (Signed)
09/12/2021  1:45 PM  PATIENT:  [name]  61 y.o. male  PRE-OPERATIVE DIAGNOSIS: Coronary artery disease  POST-OPERATIVE DIAGNOSIS: Coronary artery disease  PROCEDURE:  CORONARY ARTERY BYPASS GRAFTING (CABG) X 5 USING ENDOSCOPICALLY HARVESTED RIGHT GREATER SAPHENOUS VEIN.   LIMA-LAD SVG-OM1-OM2 (sequential) SVG-D1 SVG-Distal RCA  TRANSESOPHAGEAL ECHOCARDIOGRAM (TEE) (N/A) Vein harvest time: 3min Vein prep time: 65min  SURGEON: [name], MD - Primary  PHYSICIAN ASSISTANT: Stehler. Marcia Lepera  ASSISTANTS: Dineen Kid, RN, RN First Assistant   ANESTHESIA:   general  EBL 127ml  BLOOD ADMINISTERED:none  DRAINS:  Mediastinal and left pleural drains    LOCAL MEDICATIONS USED:  NONE  SPECIMEN:  No Specimen  DISPOSITION OF SPECIMEN:  N/A  COUNTS: Correct  DICTATION: .Dragon Dictation  PLAN OF CARE: Admit to inpatient   PATIENT DISPOSITION:  ICU - intubated and hemodynamically stable.   Delay start of Pharmacological VTE agent (>24hrs) due to surgical blood loss or risk of bleeding: yes

## 2021-09-12 NOTE — Anesthesia Procedure Notes (Signed)
Procedure Name: Intubation Date/Time: 09/12/2021 8:02 AM  Performed by: Leonor Liv, CRNAPre-anesthesia Checklist: Patient identified, Emergency Drugs available, Suction available and Patient being monitored Patient Re-evaluated:Patient Re-evaluated prior to induction Oxygen Delivery Method: Circle System Utilized Preoxygenation: Pre-oxygenation with 100% oxygen Induction Type: IV induction Ventilation: Mask ventilation without difficulty Laryngoscope Size: Glidescope and 4 Grade View: Grade II Tube type: Oral Tube size: 8.0 mm Number of attempts: 2 (DLx1 MAC 4 grade III, DLx1 S4glide grade II) Airway Equipment and Method: Oral airway, Rigid stylet and Video-laryngoscopy Placement Confirmation: ETT inserted through vocal cords under direct vision, positive ETCO2 and breath sounds checked- equal and bilateral Secured at: 22 cm Tube secured with: Tape Dental Injury: Teeth and Oropharynx as per pre-operative assessment

## 2021-09-12 NOTE — Hospital Course (Addendum)
Referring: Dr. Claiborne Billings, MD Primary Care: Patient, No Pcp Per (Inactive) Primary Cardiologist:Heather Renae Fickle, MD  History of Present Illness: The patient is a 61 year old gentleman with diabetes and severe three-vessel coronary disease with normal left ventricular function who suffered a stroke after his catheterization in November 2022.  He has continued to have intermittent episodes of angina every few days at home treated with nitroglycerin.  The etiology of his stroke was unclear.  His symptoms were dysphasia and resolved.  He wanted to wait until January to do surgery to allow him time to completely recover from his stroke.  Unfortunately he was readmitted again with aphasia that occurred suddenly.  This resolved over the first 2 days of his admission.  He was seen by neurology and underwent a work-up which did not show any intracranial abnormality. I last saw him in February and he was scheduled for surgery but his Hgb A1C on his preop labs was >15.5. We cancelled his surgery and had him follow up with his PCP to get his diabetes under control before surgery. He has continued to feel well with occasional episodes of chest discomfort that he takes NTG for. His last Hgb A1c POC was 7.6 which is much improved.  [name] has severe three-vessel coronary disease with normal left ventricular function by prior echo.  I think coronary artery bypass graft surgery is the best treatment to prevent further ischemia and infarction and improve his symptoms.  His diabetes appears to be under much better control.I discussed the operative procedure with the patient including alternatives, benefits and risks; including but not limited to bleeding, blood transfusion, infection, stroke, myocardial infarction, graft failure, heart block requiring a permanent pacemaker, organ dysfunction, and death.  [name] understands and agrees to proceed.   Hospital Course: [name] was admitted for elective surgery on 09/12/2021.   He was prepared and taken to the operative room where 5 vessel coronary bypass grafting was carried out without complication. The left internal mammary artery was grafted to the left anterior descending coronary artery. Sequential saphenous vein grafts were placed to the first and second obtuse marginals.  Separate saphenous vein grafts were placed to the diagonal and distal right coronary arteries.  Following the procedure, he separated from cardiopulmonary bypass with no inotropic agents being required.  He was transferred to the surgical ICU in stable condition. Patient was taken off the ventilator 09/13/21 at 41:93XT with no complications. During the patient's ICU stay the patient was on Dopamine drip, Insulin drip, and nitroglycerin drip which were weaned off as the patient tolerated. The patient was started on an amiodarone drip due to atrial ectopy, he later switched to sinus rhythm so amiodarone drip was discontinued and PO amiodarone was started. The patient was started on low dose Lopressor and titrated accordingly. He was hemodynamically stable on post op day 1 so chest tubes, swan ganz catheter and arterial line were removed on 09/13/21. Patient has history of stage II CKD and creatinine was at 1.58, slightly higher than 1.4 baseline so diuretic was held. He remained in stable sinus rhythm on the oral amiodarone and metoprolol.  His creatinine returned back to baseline so diuretics were begun on postop day 3 for expected volume excess.  The Foley catheter and pacer wires were removed on postop day 3. He became more hypertensive on post op day 4 so Lopressor was stopped and Coreg was started. He was felt surgically stable for transfer from the ICU to 4E on 09/16/2021;however, a bed  was not available until 09/17/2021. He was still requiring several liters of oxygen via [place]. He has been tolerating a diet and has had a bowel movement. All wounds are clean, dry, and healing without signs of infection. Patient  is off nasal cannula oxygen and saturating well on room air. He is in sinus rhythm with some PVCs and continues to have high blood pressure so his Coreg was increased, Norvasc was started and he will continue oral amiodarone. Creatinine was up to 1.61 with a baseline of 1.4 so diuretic was held for the day. His creatinine continued to increase to 1.68 so diuretic was stopped. He began to have bursts of atrial fibrillation with rates in the 120's so Plavix was discontinued and Eliquis was started. He did not have any more bursts of atrial fibrillation and his rate was controlled in the 80's. He is felt surgically stable for discharge today.

## 2021-09-12 NOTE — Interval H&P Note (Signed)
History and Physical Interval Note:  09/12/2021 7:05 AM  [name]  has presented today for surgery, with the diagnosis of CAD.  The various methods of treatment have been discussed with the patient and family. After consideration of risks, benefits and other options for treatment, the patient has consented to  Procedure(s): CORONARY ARTERY BYPASS GRAFTING (CABG) (N/A) TRANSESOPHAGEAL ECHOCARDIOGRAM (TEE) (N/A) as a surgical intervention.  The patient's history has been reviewed, patient examined, no change in status, stable for surgery.  I have reviewed the patient's chart and labs.  Questions were answered to the patient's satisfaction.     [name]

## 2021-09-12 NOTE — Anesthesia Procedure Notes (Signed)
Central Venous Catheter Insertion Performed by: Murvin Natal, MD, anesthesiologist Start/End8/04/2021 7:00 AM, 09/12/2021 7:30 AM Patient location: Pre-op. Preanesthetic checklist: patient identified, IV checked, site marked, risks and benefits discussed, surgical consent, monitors and equipment checked, pre-op evaluation, timeout performed and anesthesia consent Position: Trendelenburg Lidocaine 1% used for infiltration and patient sedated Hand hygiene performed , maximum sterile barriers used  and Seldinger technique used Catheter size: 9 Fr Total catheter length 12. PA cath was placed.Swan type:thermodilution PA Cath depth:50 Procedure performed using ultrasound guided technique. Ultrasound Notes:anatomy identified, needle tip was noted to be adjacent to the nerve/plexus identified and no ultrasound evidence of intravascular and/or intraneural injection Attempts: 1 Following insertion, line sutured, dressing applied and Biopatch. Post procedure assessment: blood return through all ports, free fluid flow and no air  Patient tolerated the procedure well with no immediate complications.

## 2021-09-13 ENCOUNTER — Inpatient Hospital Stay (HOSPITAL_COMMUNITY): Payer: Self-pay

## 2021-09-13 ENCOUNTER — Encounter (HOSPITAL_COMMUNITY): Payer: Self-pay | Admitting: Surgery

## 2021-09-13 LAB — BASIC METABOLIC PANEL
Anion gap: 6 (ref 5–15)
Anion gap: 8 (ref 5–15)
BUN: 15 mg/dL (ref 8–23)
BUN: 17 mg/dL (ref 8–23)
CO2: 23 mmol/L (ref 22–32)
CO2: 23 mmol/L (ref 22–32)
Calcium: 8.2 mg/dL — ABNORMAL LOW (ref 8.9–10.3)
Calcium: 7.9 mg/dL — ABNORMAL LOW (ref 8.9–10.3)
Chloride: 109 mmol/L (ref 98–111)
Chloride: 105 mmol/L (ref 98–111)
Creatinine, Ser: 1.58 mg/dL — ABNORMAL HIGH (ref 0.61–1.24)
Creatinine, Ser: 1.68 mg/dL — ABNORMAL HIGH (ref 0.61–1.24)
GFR, Estimated: 49 mL/min — ABNORMAL LOW (ref >60)
GFR, Estimated: 46 mL/min — ABNORMAL LOW (ref >60)
Glucose, Bld: 129 mg/dL — ABNORMAL HIGH (ref 70–99)
Glucose, Bld: 135 mg/dL — ABNORMAL HIGH (ref 70–99)
Potassium: 4.1 mmol/L (ref 3.5–5.1)
Potassium: 3.7 mmol/L (ref 3.5–5.1)
Sodium: 138 mmol/L (ref 135–145)
Sodium: 136 mmol/L (ref 135–145)

## 2021-09-13 LAB — CBC
HCT: 37.2 % — ABNORMAL LOW (ref 39.0–52.0)
HCT: 33.9 % — ABNORMAL LOW (ref 39.0–52.0)
Hemoglobin: 13.1 g/dL (ref 13.0–17.0)
Hemoglobin: 11.3 g/dL — ABNORMAL LOW (ref 13.0–17.0)
MCH: 30.6 pg (ref 26.0–34.0)
MCH: 29.4 pg (ref 26.0–34.0)
MCHC: 35.2 g/dL (ref 30.0–36.0)
MCHC: 33.3 g/dL (ref 30.0–36.0)
MCV: 86.9 fL (ref 80.0–100.0)
MCV: 88.1 fL (ref 80.0–100.0)
Platelets: 229 10*3/uL (ref 150–400)
Platelets: 161 10*3/uL (ref 150–400)
RBC: 4.28 MIL/uL (ref 4.22–5.81)
RBC: 3.85 MIL/uL — ABNORMAL LOW (ref 4.22–5.81)
RDW: 16.1 % — ABNORMAL HIGH (ref 11.5–15.5)
RDW: 16 % — ABNORMAL HIGH (ref 11.5–15.5)
WBC: 17.5 10*3/uL — ABNORMAL HIGH (ref 4.0–10.5)
WBC: 16.7 10*3/uL — ABNORMAL HIGH (ref 4.0–10.5)
nRBC: 0 % (ref 0.0–0.2)
nRBC: 0 % (ref 0.0–0.2)

## 2021-09-13 LAB — GLUCOSE, CAPILLARY
Glucose-Capillary: 113 mg/dL — ABNORMAL HIGH (ref 70–99)
Glucose-Capillary: 119 mg/dL — ABNORMAL HIGH (ref 70–99)
Glucose-Capillary: 119 mg/dL — ABNORMAL HIGH (ref 70–99)
Glucose-Capillary: 126 mg/dL — ABNORMAL HIGH (ref 70–99)
Glucose-Capillary: 128 mg/dL — ABNORMAL HIGH (ref 70–99)
Glucose-Capillary: 128 mg/dL — ABNORMAL HIGH (ref 70–99)
Glucose-Capillary: 132 mg/dL — ABNORMAL HIGH (ref 70–99)
Glucose-Capillary: 139 mg/dL — ABNORMAL HIGH (ref 70–99)
Glucose-Capillary: 172 mg/dL — ABNORMAL HIGH (ref 70–99)
Glucose-Capillary: 92 mg/dL (ref 70–99)

## 2021-09-13 LAB — POCT I-STAT 7, (LYTES, BLD GAS, ICA,H+H)
Acid-base deficit: 1 mmol/L (ref 0.0–2.0)
Bicarbonate: 24.2 mmol/L (ref 20.0–28.0)
Calcium, Ion: 1.25 mmol/L (ref 1.15–1.40)
HCT: 36 % — ABNORMAL LOW (ref 39.0–52.0)
Hemoglobin: 12.2 g/dL — ABNORMAL LOW (ref 13.0–17.0)
O2 Saturation: 94 %
Patient temperature: 37.4
Potassium: 4.2 mmol/L (ref 3.5–5.1)
Sodium: 139 mmol/L (ref 135–145)
TCO2: 26 mmol/L (ref 22–32)
pCO2 arterial: 43.5 mmHg (ref 32–48)
pH, Arterial: 7.356 (ref 7.35–7.45)
pO2, Arterial: 74 mmHg — ABNORMAL LOW (ref 83–108)

## 2021-09-13 LAB — MAGNESIUM
Magnesium: 2.7 mg/dL — ABNORMAL HIGH (ref 1.7–2.4)
Magnesium: 2.5 mg/dL — ABNORMAL HIGH (ref 1.7–2.4)

## 2021-09-13 MED ORDER — INSULIN ASPART 100 UNIT/ML IJ SOLN
0.0000 [IU] | INTRAMUSCULAR | Status: DC
  Administered 2021-09-13 (×2): 2 [IU] via SUBCUTANEOUS

## 2021-09-13 MED ORDER — AMIODARONE HCL 200 MG PO TABS
200.0000 mg | ORAL_TABLET | Freq: Two times a day (BID) | ORAL | Status: DC
  Administered 2021-09-13 – 2021-09-20 (×15): 200 mg via ORAL
  Filled 2021-09-13 (×15): qty 1

## 2021-09-13 MED ORDER — MILRINONE LACTATE IN DEXTROSE 20-5 MG/100ML-% IV SOLN
0.2500 ug/kg/min | INTRAVENOUS | Status: DC
  Filled 2021-09-13: qty 100

## 2021-09-13 MED ORDER — TRAMADOL HCL 50 MG PO TABS
50.0000 mg | ORAL_TABLET | ORAL | Status: DC | PRN
  Administered 2021-09-13 (×2): 50 mg via ORAL
  Filled 2021-09-13 (×2): qty 1

## 2021-09-13 MED ORDER — AMIODARONE HCL IN DEXTROSE 360-4.14 MG/200ML-% IV SOLN
30.0000 mg/h | INTRAVENOUS | Status: DC
  Administered 2021-09-13: 30 mg/h via INTRAVENOUS
  Filled 2021-09-13: qty 200

## 2021-09-13 MED ORDER — AMIODARONE HCL IN DEXTROSE 360-4.14 MG/200ML-% IV SOLN
60.0000 mg/h | INTRAVENOUS | Status: AC
  Administered 2021-09-13: 60 mg/h via INTRAVENOUS
  Filled 2021-09-13: qty 200

## 2021-09-13 MED ORDER — ENOXAPARIN SODIUM 40 MG/0.4ML IJ SOSY
40.0000 mg | PREFILLED_SYRINGE | Freq: Every day | INTRAMUSCULAR | Status: DC
  Administered 2021-09-13 – 2021-09-18 (×6): 40 mg via SUBCUTANEOUS
  Filled 2021-09-13 (×6): qty 0.4

## 2021-09-13 MED ORDER — INSULIN DETEMIR 100 UNIT/ML ~~LOC~~ SOLN
20.0000 [IU] | Freq: Every day | SUBCUTANEOUS | Status: DC
  Administered 2021-09-13 – 2021-09-19 (×7): 20 [IU] via SUBCUTANEOUS
  Filled 2021-09-13 (×8): qty 0.2

## 2021-09-13 MED FILL — Lidocaine HCl Local Soln Prefilled Syringe 100 MG/5ML (2%): INTRAMUSCULAR | Qty: 5 | Status: AC

## 2021-09-13 MED FILL — Heparin Sodium (Porcine) Inj 1000 Unit/ML: INTRAMUSCULAR | Qty: 10 | Status: AC

## 2021-09-13 MED FILL — Heparin Sodium (Porcine) Inj 1000 Unit/ML: Qty: 1000 | Status: AC

## 2021-09-13 MED FILL — Sodium Bicarbonate IV Soln 8.4%: INTRAVENOUS | Qty: 50 | Status: AC

## 2021-09-13 MED FILL — Thrombin (Recombinant) For Soln 20000 Unit: CUTANEOUS | Qty: 1 | Status: AC

## 2021-09-13 MED FILL — Potassium Chloride Inj 2 mEq/ML: INTRAVENOUS | Qty: 40 | Status: AC

## 2021-09-13 MED FILL — Magnesium Sulfate Inj 50%: INTRAMUSCULAR | Qty: 10 | Status: AC

## 2021-09-13 MED FILL — Sodium Chloride IV Soln 0.9%: INTRAVENOUS | Qty: 2000 | Status: AC

## 2021-09-13 MED FILL — Electrolyte-R (PH 7.4) Solution: INTRAVENOUS | Qty: 3000 | Status: AC

## 2021-09-13 MED FILL — Heparin Sodium (Porcine) Inj 1000 Unit/ML: INTRAMUSCULAR | Qty: 30 | Status: AC

## 2021-09-13 MED FILL — Mannitol IV Soln 20%: INTRAVENOUS | Qty: 500 | Status: AC

## 2021-09-13 NOTE — Progress Notes (Signed)
Patient ID: JAYDON AVINA, male   DOB: Jan 28, 1961, 61 y.o.   MRN: [id number] [facility] Evening Rounds:  Hemodynamically stable in sinus rhythm.  Remains on dop 2 for renal effects. Creat up slightly higher tonight to 1.58. Hold off on diuresis today.  BMET    Component Value Date/Time   NA 136 09/13/2021 1748   NA 135 02/13/2021 0855   K 3.7 09/13/2021 1748   CL 105 09/13/2021 1748   CO2 23 09/13/2021 1748   GLUCOSE 135 (H) 09/13/2021 1748   BUN 17 09/13/2021 1748   BUN 9 02/13/2021 0855   CREATININE 1.68 (H) 09/13/2021 1748   CALCIUM 7.9 (L) 09/13/2021 1748   EGFR 45 (L) 02/13/2021 0855   GFRNONAA 46 (L) 09/13/2021 1748   CBC    Component Value Date/Time   WBC 16.7 (H) 09/13/2021 1748   RBC 3.85 (L) 09/13/2021 1748   HGB 11.3 (L) 09/13/2021 1748   HGB 15.4 02/13/2021 0855   HCT 33.9 (L) 09/13/2021 1748   HCT 45.7 02/13/2021 0855   PLT 161 09/13/2021 1748   PLT 239 02/13/2021 0855   MCV 88.1 09/13/2021 1748   MCV 88 02/13/2021 0855   MCH 29.4 09/13/2021 1748   MCHC 33.3 09/13/2021 1748   RDW 16.0 (H) 09/13/2021 1748   RDW 11.3 (L) 02/13/2021 0855   LYMPHSABS 2.7 03/11/2021 2007   MONOABS 0.7 03/11/2021 2007   EOSABS 0.2 03/11/2021 2007   BASOSABS 0.1 03/11/2021 2007

## 2021-09-13 NOTE — Progress Notes (Signed)
1 Day Post-Op Procedure(s) (LRB): CORONARY ARTERY BYPASS GRAFTING (CABG) X 5 USING LEFT INTERNAL MAMMARY ARTERY AND ENDOSCOPICALLY HARVESTED RIGHT GREATER SAPHENOUS VEIN. (N/A) TRANSESOPHAGEAL ECHOCARDIOGRAM (TEE) (N/A) Subjective: No complaints. Feels ok   Hemodynamically stable overnight. CI measurements have always been low through this PA catheter and his LV function is normal so I doubt they are accurate.  Started on amio overnight for atrial ectopy but no clear atrial fib.  Objective: Vital signs in last 24 hours: Temp:  [97.5 F (36.4 C)-99.5 F (37.5 C)] 98.8 F (37.1 C) (08/04 0700) Pulse Rate:  [73-95] 81 (08/04 0700) Cardiac Rhythm: Normal sinus rhythm (08/03 2000) Resp:  [12-37] 18 (08/04 0700) BP: (101)/(60) 101/60 (08/03 1535) SpO2:  [88 %-100 %] 88 % (08/04 0700) Arterial Line BP: (94-278)/(56-272) 111/76 (08/04 0700) FiO2 (%):  [40 %-50 %] 40 % (08/03 2327) Weight:  [109.2 kg] 109.2 kg (08/04 0515)  Hemodynamic parameters for last 24 hours: PAP: (25-42)/(14-27) 41/26 CO:  [2.4 L/min-3.9 L/min] 3.3 L/min CI:  [1.1 L/min/m2-1.9 L/min/m2] 1.7 L/min/m2  Intake/Output from previous day: 08/03 0701 - 08/04 0700 In: 5693.7 [I.V.:3229.7; Blood:585; IV Piggyback:1879.1] Out: 3670 [Urine:2423; Blood:900; Chest Tube:347] Intake/Output this shift: No intake/output data recorded.  General appearance: alert and cooperative Neurologic: intact Heart: regular rate and rhythm, S1, S2 normal, no murmur Lungs: clear to auscultation bilaterally Extremities: edema mild Wound: dressings dry  Lab Results: Recent Labs    09/12/21 2158 09/12/21 2354 09/13/21 0133 09/13/21 0518  WBC 13.7*  --   --  17.5*  HGB 12.5*   < > 12.2* 13.1  HCT 37.7*   < > 36.0* 37.2*  PLT 185  --   --  229   < > = values in this interval not displayed.   BMET:  Recent Labs    09/12/21 2158 09/12/21 2354 09/13/21 0133 09/13/21 0518  NA 140   < > 139 138  K 4.3   < > 4.2 4.1  CL 108  --    --  109  CO2 25  --   --  23  GLUCOSE 129*  --   --  129*  BUN 17  --   --  15  CREATININE 1.40*  --   --  1.58*  CALCIUM 8.5*  --   --  8.2*   < > = values in this interval not displayed.    PT/INR:  Recent Labs    09/12/21 1554  LABPROT 15.6*  INR 1.3*   ABG    Component Value Date/Time   PHART 7.356 09/13/2021 0133   HCO3 24.2 09/13/2021 0133   TCO2 26 09/13/2021 0133   ACIDBASEDEF 1.0 09/13/2021 0133   O2SAT 94 09/13/2021 0133   CBG (last 3)  Recent Labs    09/13/21 0259 09/13/21 0517 09/13/21 0654  GLUCAP 119* 128* 119*   CXR: clear  ECG: sinus, no acute changes.  Assessment/Plan: S/P Procedure(s) (LRB): CORONARY ARTERY BYPASS GRAFTING (CABG) X 5 USING LEFT INTERNAL MAMMARY ARTERY AND ENDOSCOPICALLY HARVESTED RIGHT GREATER SAPHENOUS VEIN. (N/A) TRANSESOPHAGEAL ECHOCARDIOGRAM (TEE) (N/A)  Hemodynamically stable in sinus rhythm. Continue low dose Lopressor.  Stage ll CKD with baseline mildly elevated creat 1.4. slightly higher at 1.58 this am. Will continue low dose dopamine 2 mcg. Hold off on diuretic.  DM: Hx of poor control until recently but preop Hgb A1c down to 6.5 from 14.4 in March. Transition to Levemir and SSI.  DC all chest tubes after dangle.  DC swan and  arterial line  IS, OOB, mobilize.  Will plan to add Plavix to ASA after pacing wire removal due to severe diffuse disease.   LOS: 1 day    [name] 09/13/2021

## 2021-09-13 NOTE — Procedures (Signed)
Extubation Procedure Note  Patient Details:   Name: [name] DOB: December 23, 1960 MRN: [id number]   Airway Documentation:    Vent end date: 09/13/21 Vent end time: 0029   Evaluation  O2 sats: stable throughout Complications: No apparent complications Patient did tolerate procedure well. Bilateral Breath Sounds: Diminished (coarse)   Yes Positive Cuff Leak NIF -33 FVC 635mL Pt extubated to 4L [place]  [name] V 09/13/2021, 12:29 AM

## 2021-09-14 ENCOUNTER — Inpatient Hospital Stay (HOSPITAL_COMMUNITY): Payer: Self-pay

## 2021-09-14 LAB — CBC
HCT: 33.7 % — ABNORMAL LOW (ref 39.0–52.0)
Hemoglobin: 10.9 g/dL — ABNORMAL LOW (ref 13.0–17.0)
MCH: 29.3 pg (ref 26.0–34.0)
MCHC: 32.3 g/dL (ref 30.0–36.0)
MCV: 90.6 fL (ref 80.0–100.0)
Platelets: 172 10*3/uL (ref 150–400)
RBC: 3.72 MIL/uL — ABNORMAL LOW (ref 4.22–5.81)
RDW: 15.9 % — ABNORMAL HIGH (ref 11.5–15.5)
WBC: 13.4 10*3/uL — ABNORMAL HIGH (ref 4.0–10.5)
nRBC: 0.1 % (ref 0.0–0.2)

## 2021-09-14 LAB — BASIC METABOLIC PANEL
Anion gap: 6 (ref 5–15)
BUN: 21 mg/dL (ref 8–23)
CO2: 26 mmol/L (ref 22–32)
Calcium: 7.8 mg/dL — ABNORMAL LOW (ref 8.9–10.3)
Chloride: 104 mmol/L (ref 98–111)
Creatinine, Ser: 1.79 mg/dL — ABNORMAL HIGH (ref 0.61–1.24)
GFR, Estimated: 43 mL/min — ABNORMAL LOW (ref >60)
Glucose, Bld: 140 mg/dL — ABNORMAL HIGH (ref 70–99)
Potassium: 3.7 mmol/L (ref 3.5–5.1)
Sodium: 136 mmol/L (ref 135–145)

## 2021-09-14 LAB — GLUCOSE, CAPILLARY
Glucose-Capillary: 117 mg/dL — ABNORMAL HIGH (ref 70–99)
Glucose-Capillary: 138 mg/dL — ABNORMAL HIGH (ref 70–99)
Glucose-Capillary: 108 mg/dL — ABNORMAL HIGH (ref 70–99)
Glucose-Capillary: 101 mg/dL — ABNORMAL HIGH (ref 70–99)

## 2021-09-14 MED ORDER — POTASSIUM CHLORIDE CRYS ER 20 MEQ PO TBCR
20.0000 meq | EXTENDED_RELEASE_TABLET | Freq: Once | ORAL | Status: AC
  Administered 2021-09-14: 20 meq via ORAL
  Filled 2021-09-14: qty 1

## 2021-09-14 MED ORDER — INSULIN ASPART 100 UNIT/ML IJ SOLN
0.0000 [IU] | Freq: Three times a day (TID) | INTRAMUSCULAR | Status: DC
  Administered 2021-09-14 – 2021-09-19 (×7): 2 [IU] via SUBCUTANEOUS

## 2021-09-14 NOTE — Progress Notes (Signed)
Patient ID: [name], male   DOB: 10/04/1960, 61 y.o.   MRN: [id number] T[facility] Evening Rounds:  Hemodynamically stable in sinus rhythm.  UO ok  Has been up walking in room but not in halls yet. He feels like he is ready to walk.

## 2021-09-14 NOTE — Progress Notes (Addendum)
2 Days Post-Op Procedure(s) (LRB): CORONARY ARTERY BYPASS GRAFTING (CABG) X 5 USING LEFT INTERNAL MAMMARY ARTERY AND ENDOSCOPICALLY HARVESTED RIGHT GREATER SAPHENOUS VEIN. (N/A) TRANSESOPHAGEAL ECHOCARDIOGRAM (TEE) (N/A) Subjective: No complaints. Feels ok. Has not walked yet since surgery.  Objective: Vital signs in last 24 hours: Temp:  [97.8 F (36.6 C)-98.8 F (37.1 C)] 97.8 F (36.6 C) (08/04 2014) Pulse Rate:  [70-82] 79 (08/05 0700) Cardiac Rhythm: Normal sinus rhythm (08/04 2000) Resp:  [18-39] 26 (08/05 0700) BP: (108-125)/(60-81) 116/78 (08/05 0700) SpO2:  [85 %-96 %] 86 % (08/05 0700) Arterial Line BP: (55-129)/(36-125) 125/67 (08/04 1700) Weight:  [107.9 kg] 107.9 kg (08/05 0630)  Hemodynamic parameters for last 24 hours: PAP: (32-43)/(21-33) 38/28  Intake/Output from previous day: 08/04 0701 - 08/05 0700 In: 1693.9 [P.O.:500; I.V.:993.9; IV Piggyback:200] Out: 700 [Urine:700] Intake/Output this shift: No intake/output data recorded.  General appearance: alert and cooperative Neurologic: intact Heart: regular rate and rhythm, S1, S2 normal, no murmur Lungs: diminished breath sounds bibasilar Extremities: edema mild Wound: dressing dry  Lab Results: Recent Labs    09/13/21 1748 09/14/21 0547  WBC 16.7* 13.4*  HGB 11.3* 10.9*  HCT 33.9* 33.7*  PLT 161 172   BMET:  Recent Labs    09/13/21 1748 09/14/21 0547  NA 136 136  K 3.7 3.7  CL 105 104  CO2 23 26  GLUCOSE 135* 140*  BUN 17 21  CREATININE 1.68* 1.79*  CALCIUM 7.9* 7.8*    PT/INR:  Recent Labs    09/12/21 1554  LABPROT 15.6*  INR 1.3*   ABG    Component Value Date/Time   PHART 7.356 09/13/2021 0133   HCO3 24.2 09/13/2021 0133   TCO2 26 09/13/2021 0133   ACIDBASEDEF 1.0 09/13/2021 0133   O2SAT 94 09/13/2021 0133   CBG (last 3)  Recent Labs    09/13/21 2004 09/13/21 2330 09/14/21 0555  GLUCAP 126* 172* 117*   CXR: small pleural effusions and bibasilar  atelectasis  Assessment/Plan: S/P Procedure(s) (LRB): CORONARY ARTERY BYPASS GRAFTING (CABG) X 5 USING LEFT INTERNAL MAMMARY ARTERY AND ENDOSCOPICALLY HARVESTED RIGHT GREATER SAPHENOUS VEIN. (N/A) TRANSESOPHAGEAL ECHOCARDIOGRAM (TEE) (N/A)  POD 2 Hemodynamically stable in sinus rhythm. Continue lopressor and amio for atrial ectopy. Has not had any runs of atrial fib.  Stage 2 CKD with baseline creat 1.5. 1.68 yesterday and 1.79 this am. UO 30/hr. Will continue low dose dopamine and observe off diuretic today. If creat stable in am will stop dopamine and start diuretic. He is 13 lbs over preop. Keep foley in today to monitor UO  DM: continue Levemir and SSI.  Continue IS and start ambulation.      LOS: 2 days    [name] 09/14/2021

## 2021-09-15 LAB — GLUCOSE, CAPILLARY
Glucose-Capillary: 139 mg/dL — ABNORMAL HIGH (ref 70–99)
Glucose-Capillary: 104 mg/dL — ABNORMAL HIGH (ref 70–99)
Glucose-Capillary: 128 mg/dL — ABNORMAL HIGH (ref 70–99)
Glucose-Capillary: 93 mg/dL (ref 70–99)

## 2021-09-15 LAB — BASIC METABOLIC PANEL
Anion gap: 6 (ref 5–15)
BUN: 20 mg/dL (ref 8–23)
CO2: 27 mmol/L (ref 22–32)
Calcium: 7.9 mg/dL — ABNORMAL LOW (ref 8.9–10.3)
Chloride: 105 mmol/L (ref 98–111)
Creatinine, Ser: 1.49 mg/dL — ABNORMAL HIGH (ref 0.61–1.24)
GFR, Estimated: 53 mL/min — ABNORMAL LOW (ref >60)
Glucose, Bld: 102 mg/dL — ABNORMAL HIGH (ref 70–99)
Potassium: 3.9 mmol/L (ref 3.5–5.1)
Sodium: 138 mmol/L (ref 135–145)

## 2021-09-15 MED ORDER — FUROSEMIDE 10 MG/ML IJ SOLN
40.0000 mg | Freq: Once | INTRAMUSCULAR | Status: AC
  Administered 2021-09-15: 40 mg via INTRAVENOUS
  Filled 2021-09-15: qty 4

## 2021-09-15 MED ORDER — POTASSIUM CHLORIDE CRYS ER 20 MEQ PO TBCR
20.0000 meq | EXTENDED_RELEASE_TABLET | Freq: Two times a day (BID) | ORAL | Status: AC
Start: 2021-09-15 — End: 2021-09-15
  Administered 2021-09-15 (×2): 20 meq via ORAL
  Filled 2021-09-15 (×2): qty 1

## 2021-09-15 NOTE — Progress Notes (Signed)
Patient ID: [name], male   DOB: Dec 23, 1960, 61 y.o.   MRN: [id number] T[facility] Evening Rounds:  Hemodynamically stable in sinus rhythm.  Ambulated today.  Good response to lasix.  Bowels working.  Sleeping now.

## 2021-09-15 NOTE — Progress Notes (Signed)
3 Days Post-Op Procedure(s) (LRB): CORONARY ARTERY BYPASS GRAFTING (CABG) X 5 USING LEFT INTERNAL MAMMARY ARTERY AND ENDOSCOPICALLY HARVESTED RIGHT GREATER SAPHENOUS VEIN. (N/A) TRANSESOPHAGEAL ECHOCARDIOGRAM (TEE) (N/A) Subjective: No complaints  Objective: Vital signs in last 24 hours: Temp:  [98.1 F (36.7 C)-98.5 F (36.9 C)] 98.5 F (36.9 C) (08/06 0717) Pulse Rate:  [70-86] 86 (08/06 0700) Resp:  [15-29] 24 (08/06 0700) BP: (108-164)/(69-123) 126/94 (08/06 0700) SpO2:  [90 %-95 %] 94 % (08/06 0700) Weight:  [105.3 kg] 105.3 kg (08/06 0500)  Hemodynamic parameters for last 24 hours:    Intake/Output from previous day: 08/05 0701 - 08/06 0700 In: 1212 [P.O.:400; I.V.:812] Out: 1116 [Urine:1115; Stool:1] Intake/Output this shift: No intake/output data recorded.  General appearance: alert and cooperative Neurologic: intact Heart: regular rate and rhythm, S1, S2 normal, no murmur Lungs: decreased in bases  Extremities: edema mild Wound: dressings dry  Lab Results: Recent Labs    09/13/21 1748 09/14/21 0547  WBC 16.7* 13.4*  HGB 11.3* 10.9*  HCT 33.9* 33.7*  PLT 161 172   BMET:  Recent Labs    09/14/21 0547 09/15/21 0541  NA 136 138  K 3.7 3.9  CL 104 105  CO2 26 27  GLUCOSE 140* 102*  BUN 21 20  CREATININE 1.79* 1.49*  CALCIUM 7.8* 7.9*    PT/INR:  Recent Labs    09/12/21 1554  LABPROT 15.6*  INR 1.3*   ABG    Component Value Date/Time   PHART 7.356 09/13/2021 0133   HCO3 24.2 09/13/2021 0133   TCO2 26 09/13/2021 0133   ACIDBASEDEF 1.0 09/13/2021 0133   O2SAT 94 09/13/2021 0133   CBG (last 3)  Recent Labs    09/14/21 1516 09/14/21 2128 09/15/21 0820  GLUCAP 108* 101* 139*    Assessment/Plan: S/P Procedure(s) (LRB): CORONARY ARTERY BYPASS GRAFTING (CABG) X 5 USING LEFT INTERNAL MAMMARY ARTERY AND ENDOSCOPICALLY HARVESTED RIGHT GREATER SAPHENOUS VEIN. (N/A) TRANSESOPHAGEAL ECHOCARDIOGRAM (TEE) (N/A)  POD 3 Hemodynamically stable  in sinus rhythm on amio and Lopressor.  CKD: creatinine back to baseline. Will start diuresis. About 7 lbs over preop wt. DC dopamine.  DC foley  IS, ambulate  DM: glucose under good control on Levemir and SSI.   LOS: 3 days    [name] 09/15/2021

## 2021-09-16 ENCOUNTER — Other Ambulatory Visit: Payer: Self-pay

## 2021-09-16 LAB — BASIC METABOLIC PANEL
Anion gap: 10 (ref 5–15)
BUN: 21 mg/dL (ref 8–23)
CO2: 27 mmol/L (ref 22–32)
Calcium: 8.1 mg/dL — ABNORMAL LOW (ref 8.9–10.3)
Chloride: 104 mmol/L (ref 98–111)
Creatinine, Ser: 1.52 mg/dL — ABNORMAL HIGH (ref 0.61–1.24)
GFR, Estimated: 52 mL/min — ABNORMAL LOW (ref >60)
Glucose, Bld: 111 mg/dL — ABNORMAL HIGH (ref 70–99)
Potassium: 3.6 mmol/L (ref 3.5–5.1)
Sodium: 141 mmol/L (ref 135–145)

## 2021-09-16 LAB — CBC
HCT: 31.8 % — ABNORMAL LOW (ref 39.0–52.0)
Hemoglobin: 10.4 g/dL — ABNORMAL LOW (ref 13.0–17.0)
MCH: 29 pg (ref 26.0–34.0)
MCHC: 32.7 g/dL (ref 30.0–36.0)
MCV: 88.6 fL (ref 80.0–100.0)
Platelets: 242 10*3/uL (ref 150–400)
RBC: 3.59 MIL/uL — ABNORMAL LOW (ref 4.22–5.81)
RDW: 16.1 % — ABNORMAL HIGH (ref 11.5–15.5)
WBC: 11.7 10*3/uL — ABNORMAL HIGH (ref 4.0–10.5)
nRBC: 0 % (ref 0.0–0.2)

## 2021-09-16 LAB — GLUCOSE, CAPILLARY
Glucose-Capillary: 106 mg/dL — ABNORMAL HIGH (ref 70–99)
Glucose-Capillary: 120 mg/dL — ABNORMAL HIGH (ref 70–99)
Glucose-Capillary: 113 mg/dL — ABNORMAL HIGH (ref 70–99)
Glucose-Capillary: 135 mg/dL — ABNORMAL HIGH (ref 70–99)

## 2021-09-16 MED ORDER — SODIUM CHLORIDE 0.9% FLUSH: 3.0000 mL | INTRAVENOUS | Status: DC | PRN

## 2021-09-16 MED ORDER — CLOPIDOGREL BISULFATE 75 MG PO TABS
75.0000 mg | ORAL_TABLET | Freq: Every day | ORAL | Status: DC
  Administered 2021-09-16 – 2021-09-18 (×3): 75 mg via ORAL
  Filled 2021-09-16 (×3): qty 1

## 2021-09-16 MED ORDER — POTASSIUM CHLORIDE CRYS ER 20 MEQ PO TBCR
20.0000 meq | EXTENDED_RELEASE_TABLET | Freq: Two times a day (BID) | ORAL | Status: DC
  Administered 2021-09-16 (×2): 20 meq via ORAL
  Filled 2021-09-16 (×2): qty 1

## 2021-09-16 MED ORDER — METOPROLOL TARTRATE 25 MG PO TABS: 25.0000 mg | ORAL_TABLET | Freq: Two times a day (BID) | ORAL | Status: DC

## 2021-09-16 MED ORDER — METOPROLOL TARTRATE 25 MG/10 ML ORAL SUSPENSION: 25.0000 mg | Freq: Two times a day (BID) | ORAL | Status: DC

## 2021-09-16 MED ORDER — TORSEMIDE 20 MG PO TABS
40.0000 mg | ORAL_TABLET | Freq: Every day | ORAL | Status: DC
  Administered 2021-09-16: 40 mg via ORAL
  Filled 2021-09-16: qty 2

## 2021-09-16 MED ORDER — CARVEDILOL 6.25 MG PO TABS
6.2500 mg | ORAL_TABLET | Freq: Two times a day (BID) | ORAL | Status: DC
  Administered 2021-09-16 (×2): 6.25 mg via ORAL
  Filled 2021-09-16 (×2): qty 1

## 2021-09-16 MED ORDER — ~~LOC~~ CARDIAC SURGERY, PATIENT & FAMILY EDUCATION: Freq: Once | Status: DC

## 2021-09-16 MED ORDER — HYDRALAZINE HCL 20 MG/ML IJ SOLN
10.0000 mg | INTRAMUSCULAR | Status: DC | PRN
Start: 2021-09-16 — End: 2021-09-17
  Administered 2021-09-17 (×2): 10 mg via INTRAVENOUS
  Filled 2021-09-16: qty 1

## 2021-09-16 MED ORDER — SODIUM CHLORIDE 0.9% FLUSH
3.0000 mL | Freq: Two times a day (BID) | INTRAVENOUS | Status: DC
  Administered 2021-09-16 – 2021-09-19 (×6): 3 mL via INTRAVENOUS

## 2021-09-16 MED ORDER — SODIUM CHLORIDE 0.9 % IV SOLN: 250.0000 mL | INTRAVENOUS | Status: DC | PRN

## 2021-09-16 MED ORDER — ASPIRIN 81 MG PO TBEC
81.0000 mg | DELAYED_RELEASE_TABLET | Freq: Every day | ORAL | Status: DC
  Administered 2021-09-17 – 2021-09-20 (×4): 81 mg via ORAL
  Filled 2021-09-16 (×4): qty 1

## 2021-09-16 MED ORDER — FE FUMARATE-B12-VIT C-FA-IFC PO CAPS
1.0000 | ORAL_CAPSULE | Freq: Two times a day (BID) | ORAL | Status: DC
  Administered 2021-09-16 – 2021-09-20 (×8): 1 via ORAL
  Filled 2021-09-16 (×8): qty 1

## 2021-09-16 MED ORDER — AMLODIPINE BESYLATE 10 MG PO TABS
10.0000 mg | ORAL_TABLET | Freq: Every day | ORAL | Status: DC
  Administered 2021-09-16 – 2021-09-20 (×5): 10 mg via ORAL
  Filled 2021-09-16 (×5): qty 1

## 2021-09-16 NOTE — Progress Notes (Signed)
CT surgery PM rounds  Patient states he walked 3 times in the hallway and had a good day Waiting for transfer bed 4 E.  Blood pressure (!) 160/102, pulse 85, temperature 98.3 F (36.8 C), temperature source Oral, resp. rate (!) 23, height 5\' 7"  (1.702 m), weight 106.6 kg, SpO2 95 %.

## 2021-09-16 NOTE — Progress Notes (Signed)
4 Days Post-Op Procedure(s) (LRB): CORONARY ARTERY BYPASS GRAFTING (CABG) X 5 USING LEFT INTERNAL MAMMARY ARTERY AND ENDOSCOPICALLY HARVESTED RIGHT GREATER SAPHENOUS VEIN. (N/A) TRANSESOPHAGEAL ECHOCARDIOGRAM (TEE) (N/A) Subjective: No complaints. Feels well, ambulating around ICU. Sitting up eating. Bowels working.    Objective: Vital signs in last 24 hours: Temp:  [98 F (36.7 C)-98.5 F (36.9 C)] 98 F (36.7 C) (08/07 0400) Pulse Rate:  [57-83] 83 (08/07 0700) Cardiac Rhythm: Normal sinus rhythm (08/06 2000) Resp:  [16-38] 24 (08/07 0700) BP: (103-168)/(73-114) 143/110 (08/07 0700) SpO2:  [85 %-97 %] 93 % (08/07 0700) Weight:  [106.6 kg] 106.6 kg (08/07 0500)  Hemodynamic parameters for last 24 hours:    Intake/Output from previous day: 08/06 0701 - 08/07 0700 In: 1353.4 [P.O.:840; I.V.:513.4] Out: 1550 [WGNFA:2130] Intake/Output this shift: No intake/output data recorded.  General appearance: alert and cooperative Neurologic: intact Heart: regular rate and rhythm Lungs: clear to auscultation bilaterally Extremities: edema mild Wound: incisions ok  Lab Results: Recent Labs    09/14/21 0547 09/16/21 0511  WBC 13.4* 11.7*  HGB 10.9* 10.4*  HCT 33.7* 31.8*  PLT 172 242   BMET:  Recent Labs    09/15/21 0541 09/16/21 0511  NA 138 141  K 3.9 3.6  CL 105 104  CO2 27 27  GLUCOSE 102* 111*  BUN 20 21  CREATININE 1.49* 1.52*  CALCIUM 7.9* 8.1*    PT/INR: No results for input(s): "LABPROT", "INR" in the last 72 hours. ABG    Component Value Date/Time   PHART 7.356 09/13/2021 0133   HCO3 24.2 09/13/2021 0133   TCO2 26 09/13/2021 0133   ACIDBASEDEF 1.0 09/13/2021 0133   O2SAT 94 09/13/2021 0133   CBG (last 3)  Recent Labs    09/15/21 1131 09/15/21 1546 09/15/21 2116  GLUCAP 104* 128* 93    Assessment/Plan: S/P Procedure(s) (LRB): CORONARY ARTERY BYPASS GRAFTING (CABG) X 5 USING LEFT INTERNAL MAMMARY ARTERY AND ENDOSCOPICALLY HARVESTED RIGHT  GREATER SAPHENOUS VEIN. (N/A) TRANSESOPHAGEAL ECHOCARDIOGRAM (TEE) (N/A)  POD 4 Hemodynamically stable with rising BP. Will switch Lopressor to Coreg which should help with BP. Resume Norvasc as needed.  Maintaining sinus rhythm on oral amio. Never really had runs of AF, just a lot of atrial ectopy.  CKD: creat up slightly from yesterday but at baseline.  Volume excess: -196 cc yesterday. Wt up 3 lbs if accurate. 10 lbs over preop. Will start daily Demedex 40 and Kdur.  DM: glucose under good control on levemir and SSI.  DC neck sleeve.  Transfer to 4E and continue IS, ambulation.   LOS: 4 days    [name] 09/16/2021

## 2021-09-16 NOTE — Discharge Summary (Signed)
Physician Discharge Summary       [address]       ,[place] [address]             [phone]    Patient ID: [name] MRN: [id number] DOB/AGE: [birth year] 61 y.o.  Admit date: 09/12/2021 Discharge date: 09/20/2021  Admission Diagnoses: Coronary artery disease Discharge Diagnoses:  1.  S/P CABG x 5 2. History of the following: AKI (acute kidney injury) ([place])       pt unaware of this   Anginal pain ([place])     CAD (coronary artery disease)      a. 03/2015 NSTEMI: LHC with severe 3V CAD  (70% mid RCA, 95% OM1, 90% distal LCx, 90% OM3, 80% prox LAD and 90% ost D1) s/p DES to mLAD w/ small dissction Rx with DES, staged ost Ramus PCI/DES and dLCx s/p PCI/DES    Chest pain 12/24/2020   Diabetes mellitus type 2 in obese [facility])     Diverticulosis     Dyspnea on exertion 03/16/2015    Dyspnea on exertion   Family history of adverse reaction to anesthesia      patient father- pt states after anesthesia his father "developed dementia"   Hypercholesteremia     Hypertension associated with diabetes ([place]) 03/16/2015    hypertension   NSTEMI (non-ST elevated myocardial infarction) ([place]) 03/17/2015   Obesity     Stroke ([place]) 2022    pt states he had a "mini stroke" during cardiac catheterization   Tobacco abuse     Consults: None  Procedure (s):  Median Sternotomy Extracorporeal circulation 3.   Coronary artery bypass grafting x 5   Left internal mammary artery graft to the LAD SVG to diagonal Sequential SVG to OM1 and OM2 SVG to distal RCA 4.   Endoscopic vein harvest from the right leg by Dr. Cyndia Reyes on 09/12/2021.    Referring: Dr. Claiborne Billings, MD Primary Care: Patient, No Pcp Per (Inactive) Primary Cardiologist:Mark Renae Fickle, MD  History of Present Illness: The patient is a 61 year old gentleman with diabetes and severe three-vessel coronary disease with normal left ventricular function who suffered a stroke after his catheterization in November 2022.  He  has continued to have intermittent episodes of angina every few days at home treated with nitroglycerin.  The etiology of his stroke was unclear.  His symptoms were dysphasia and resolved.  He wanted to wait until January to do surgery to allow him time to completely recover from his stroke.  Unfortunately he was readmitted again with aphasia that occurred suddenly.  This resolved over the first 2 days of his admission.  He was seen by neurology and underwent a work-up which did not show any intracranial abnormality. I last saw him in February and he was scheduled for surgery but his Hgb A1C on his preop labs was >15.5. We cancelled his surgery and had him follow up with his PCP to get his diabetes under control before surgery. He has continued to feel well with occasional episodes of chest discomfort that he takes NTG for. His last Hgb A1c POC was 7.6 which is much improved.  Mr.[name] has severe three-vessel coronary disease with normal left ventricular function by prior echo.  I think coronary artery bypass graft surgery is the best treatment to prevent further ischemia and infarction and improve his symptoms.  His diabetes appears to be under much better control.I discussed the operative procedure with the patient including alternatives, benefits and risks; including  but not limited to bleeding, blood transfusion, infection, stroke, myocardial infarction, graft failure, heart block requiring a permanent pacemaker, organ dysfunction, and death.  [name] understands and agrees to proceed.   Hospital Course: [name] was admitted for elective surgery on 09/12/2021.  He was prepared and taken to the operative room where 5 vessel coronary bypass grafting was carried out without complication. The left internal mammary artery was grafted to the left anterior descending coronary artery. Sequential saphenous vein grafts were placed to the first and second obtuse marginals.  Separate saphenous vein grafts were placed  to the diagonal and distal right coronary arteries.  Following the procedure, he separated from cardiopulmonary bypass with no inotropic agents being required.  He was transferred to the surgical ICU in stable condition. Patient was taken off the ventilator 09/13/21 at 81:19JY with no complications. During the patient's ICU stay the patient was on Dopamine drip, Insulin drip, and nitroglycerin drip which were weaned off as the patient tolerated. The patient was started on an amiodarone drip due to atrial ectopy, he later switched to sinus rhythm so amiodarone drip was discontinued and PO amiodarone was started. The patient was started on low dose Lopressor and titrated accordingly. He was hemodynamically stable on post op day 1 so chest tubes, swan ganz catheter and arterial line were removed on 09/13/21. Patient has history of stage II CKD and creatinine was at 1.58, slightly higher than 1.4 baseline so diuretic was held. He remained in stable sinus rhythm on the oral amiodarone and metoprolol.  His creatinine returned back to baseline so diuretics were begun on postop day 3 for expected volume excess.  The Foley catheter and pacer wires were removed on postop day 3. He became more hypertensive on post op day 4 so Lopressor was stopped and Coreg was started. He was felt surgically stable for transfer from the ICU to 4E on 09/16/2021;however, a bed was not available until 09/17/2021. He was still requiring several liters of oxygen via [place]. He has been tolerating a diet and has had a bowel movement. All wounds are clean, dry, and healing without signs of infection. Patient is off nasal cannula oxygen and saturating well on room air. He is in sinus rhythm with some PVCs and continues to have high blood pressure so his Coreg was increased, Norvasc was started and he will continue oral amiodarone. Creatinine was up to 1.61 with a baseline of 1.4 so diuretic was held for the day. His creatinine continued to increase to  1.68 so diuretic was stopped. He began to have bursts of atrial fibrillation with rates in the 120's so Plavix was discontinued and Eliquis was started. He did not have any more bursts of atrial fibrillation and his rate was controlled in the 80's. He is felt surgically stable for discharge today.    Latest Vital Signs: Blood pressure 119/78, pulse 71, temperature (!) 97.5 F (36.4 C), temperature source Oral, resp. rate (!) 21, height 5' 7"  (1.702 m), weight 101 kg, SpO2 94 %.  Physical Exam: General appearance: alert, cooperative, and no distress Neurologic: intact Heart: regular rate and rhythm, S1, S2 normal, no murmur, click, rub or gallop Lungs: clear to auscultation bilaterally Abdomen: soft, non-tender; bowel sounds normal; no masses,  no organomegaly Extremities: extremities normal, atraumatic, no cyanosis or edema Wound: Clean and dry  Discharge Condition:Stable and discharged to home.  Recent laboratory studies:  Lab Results  Component Value Date   WBC 11.7 (H) 09/16/2021   HGB  10.4 (L) 09/16/2021   HCT 31.8 (L) 09/16/2021   MCV 88.6 09/16/2021   PLT 242 09/16/2021   Lab Results  Component Value Date   NA 139 09/19/2021   K 3.8 09/19/2021   CL 102 09/19/2021   CO2 29 09/19/2021   CREATININE 1.68 (H) 09/19/2021   GLUCOSE 113 (H) 09/19/2021      Diagnostic Studies: DG Chest Port 1 View  Result Date: 09/14/2021 CLINICAL DATA:  Follow-up for CABG surgery. EXAM: PORTABLE CHEST 1 VIEW COMPARISON:  09/13/2021. FINDINGS: Persistent lung base opacities obscure the hemidiaphragms consistent with a combination of pleural effusions and atelectasis. Mid and upper lungs are clear. Stable cardiac silhouette.  No mediastinal widening. Stable right internal jugular introducer sheath. No pneumothorax. IMPRESSION: 1. No change from the previous day's exam. 2. Persistent lung base opacities consistent with atelectasis and pleural effusions. No pulmonary edema, mediastinal widening or  pneumothorax. Electronically Signed   By: Lajean Manes M.D.   On: 09/14/2021 09:39   DG Chest 1 View  Result Date: 09/13/2021 CLINICAL DATA:  Post chest tube removal EXAM: CHEST  1 VIEW COMPARISON:  Portable exam 1708 hours compared to 0510 hours FINDINGS: Interval removal of mediastinal drain, LEFT thoracostomy tube, and Swan-Ganz catheter. Enlargement of cardiac silhouette post CABG. Mediastinal contours and pulmonary vascularity normal. Bibasilar atelectasis. No acute infiltrate, pleural effusion, or pneumothorax. Epicardial pacing wires remain. IMPRESSION: No pneumothorax following chest tube removal. Bibasilar atelectasis. Electronically Signed   By: Lavonia Dana M.D.   On: 09/13/2021 17:24   DG Chest Port 1 View  Result Date: 09/13/2021 CLINICAL DATA:  Sore chest.  Chest tube present EXAM: PORTABLE CHEST 1 VIEW COMPARISON:  Yesterday FINDINGS: Tracheal and esophageal extubation. Chest drains remain in place. Swan-Ganz catheter from right IJ approach with tip at the main pulmonary artery. CABG with cardiomegaly and vascular pedicle widening that is stable. Stable atelectasis. No visible pneumothorax. IMPRESSION: 1. Extubation with stable inflation and atelectasis. 2. Remaining hardware in unremarkable position. Electronically Signed   By: Jorje Guild M.D.   On: 09/13/2021 07:15   DG Chest Port 1 View  Result Date: 09/12/2021 CLINICAL DATA:  Postop CABG. EXAM: PORTABLE CHEST 1 VIEW COMPARISON:  09/10/2021 FINDINGS: Item 1558 hours. Endotracheal tube tip is 5.2 cm above the base of the carina. NG tube tip overlies the gastric fundus left chest tube noted without pneumothorax or substantial pleural effusion. Midline mediastinal/pericardial drain evident. Right IJ pulmonary artery catheter tip is in the pulmonary outflow tract or proximal right main pulmonary artery. Lung volumes are low with left base atelectasis and probable tiny left effusion. IMPRESSION: 1. Support apparatus as above. No  pneumothorax. 2. Low lung volumes with left base atelectasis and probable tiny left effusion. Electronically Signed   By: Misty Stanley M.D.   On: 09/12/2021 16:31   DG Chest 2 View  Result Date: 09/10/2021 CLINICAL DATA:  Preop imaging EXAM: CHEST - 2 VIEW COMPARISON:  Radiographs 04/12/2021 FINDINGS: No focal consolidation, pleural effusion, or pneumothorax. Normal cardiomediastinal silhouette. No acute osseous abnormality. Coronary stenting. Aortic calcification. IMPRESSION: No active cardiopulmonary disease. Electronically Signed   By: Placido Sou M.D.   On: 09/10/2021 20:54    Discharge Instructions     Amb Referral to Cardiac Rehabilitation   Complete by: As directed    Diagnosis: CABG   CABG X ___: 5   After initial evaluation and assessments completed: Virtual Based Care may be provided alone or in conjunction with Phase 2 Cardiac Rehab  based on patient barriers.: Yes       Discharge Medications: Allergies as of 09/20/2021       Reactions   Aspirin Other (See Comments)   Upset stomach  Pt states he is not allergic to aspirin        Medication List     STOP taking these medications    clopidogrel 75 MG tablet Commonly known as: PLAVIX   furosemide 20 MG tablet Commonly known as: Lasix   isosorbide mononitrate 60 MG 24 hr tablet Commonly known as: IMDUR   nitroGLYCERIN 0.4 MG SL tablet Commonly known as: NITROSTAT       TAKE these medications    amiodarone 200 MG tablet Commonly known as: PACERONE Take 1 tablet by mouth  twice daily for 2 days then take 1 tablet daily thereafter   amLODipine 10 MG tablet Commonly known as: NORVASC Take 1 tablet (10 mg total) by mouth daily.   aspirin EC 81 MG tablet Take 1 tablet (81 mg total) by mouth daily. Swallow whole. What changed:  medication strength how much to take additional instructions   atorvastatin 80 MG tablet Commonly known as: LIPITOR Take 1 tablet (80 mg total) by mouth daily.   Basaglar  KwikPen 100 UNIT/ML Inject 62 Units into the skin daily.   blood glucose meter kit and supplies Dispense based on patient and insurance preference. Use up to four times daily as directed. (FOR ICD-10 E10.9, E11.9).   carvedilol 25 MG tablet Commonly known as: COREG Take 1 tablet (25 mg total) by mouth 2 (two) times daily with a meal. What changed:  medication strength how much to take   Eliquis 5 MG Tabs tablet Generic drug: apixaban Take 1 tablet (5 mg total) by mouth 2 (two) times daily.   ferrous sulfate 325 (65 FE) MG tablet Take 1 tablet (325 mg total) by mouth daily with breakfast. Please take for 1 month, may stop sooner if develop constipation   FreeStyle Libre 2 Sensor Misc Utilize as directed q 14 days to monitor blood sugar.   oxyCODONE 5 MG immediate release tablet Commonly known as: Oxy IR/ROXICODONE Take 1 tablet (5 mg total) by mouth every 6 (six) hours as needed for severe pain.   Semaglutide (1 MG/DOSE) 4 MG/3ML Sopn Inject 1 mg as directed once a week.   True Metrix Blood Glucose Test test strip Generic drug: glucose blood Use to check blood sugar three times daily.   True Metrix Meter w/Device Kit Use to check blood sugar three times daily.   TRUEplus 5-Bevel Pen Needles 32G X 4 MM Misc Generic drug: Insulin Pen Needle Use to inject Basaglar once daily.   TRUEplus Lancets 28G Misc Use to check blood sugar three times daily.   Trulicity 1.5 KN/3.9JQ Sopn Generic drug: Dulaglutide Inject 1.5 mg into the skin once a week.               Durable Medical Equipment  (From admission, onward)           Start     Ordered   09/19/21 1148  For home use only DME Walker rolling  Once       Question Answer Comment  Walker: With 5 Inch Wheels   Patient needs a walker to treat with the following condition S/P CABG (coronary artery bypass graft)      09/19/21 1147           The patient has been discharged on:   1.Beta  Blocker:  Yes [ x   ]                              No   [   ]                              If No, reason:  2.Ace Inhibitor/ARB: Yes [   ]                                     No  [  x ]                                     If No, reason: CKD  3.Statin:   Yes [ x  ]                  No  [   ]                  If No, reason:  4.Ecasa:  Yes  [ x  ]                  No   [   ]                  If No, reason:  Patient had ACS upon admission:No  Plavix/P2Y12 inhibitor: Yes [   ]                                       No  [  x ] On Eliquis for Afib Follow Up Appointments:  Follow-up Information     Marylu Lund., NP Follow up on 09/25/2021.   Specialty: Nurse Practitioner Why: 9:30AM. Cardiology follow up Contact information: [address] [place] [address] [phone]         [name], MD. Go on 10/23/2021.   Specialty: Cardiothoracic Surgery Why: PA/lAT CXR to be taken (at [facility] which is in the same building as Dr. Vivi Martens office) on 09/13 at 12:00 pm;Appointment time is at 12:30 pm Contact information: [address] [place] [address] [phone]         [facility] [place]. Go on 09/26/2021.   Specialty: Cardiothoracic Surgery Why: Appointment is with nurse only for chest tube suture removal. Appointment time is at Contact information: [place], [place], [place] [facility] Follow up.   Why: ([facility])- Rolling walker arranged- to be delivered to room prior to discharge Contact information: [address] [place] [address] [phone]         [name], MD. Call.   Specialty: Family Medicine Why: Call for follow up appointment regarding diabetes management. Last HgbA1C 7.6. Contact information: [place] [address] [phone]         Freada Bergeron, MD .    Specialties: Cardiology, Radiology Contact information: [address] [place] [address] [phone]  Signed: Magdalene River, PA-C 09/20/2021, 12:39 PM

## 2021-09-16 NOTE — Progress Notes (Signed)
CARDIAC REHAB PHASE I     Pt resting in bed. Per pt, he has been up out of bed most of the morning and has walked without difficulty. He is ready for a nap. Pt states that his pain is under control and using his IS often. Encouraged continued ambulation and IS use. Transfer to floor ordered. Will return after lunch to ambulate as time permits. Will continue to follow.   [id number]  Vanessa Barbara, RN BSN 09/16/2021 10:55 AM

## 2021-09-16 NOTE — Progress Notes (Signed)
CARDIAC REHAB PHASE I   PRE:  Rate/Rhythm: 73 SR  BP:  Sitting: 151/83      SaO2: 96 2l/[place]  MODE:  Ambulation: 370 ft   POST:  Rate/Rhythm: 83  BP:  Sitting: 151/94      SaO2: 92 2L/[place]  Pt ambulated in hall using four wheel walker. No CP or SOB. Back to room to bed with call bell in reach. Back in bed using good sternal precautions. Pain is tolerable movement is the worst part per pt. Encouraged ambulation and IS use. Will continue to follow.  [id number]  Vanessa Barbara, RN BSN 09/16/2021 2:19 PM

## 2021-09-16 NOTE — Discharge Instructions (Signed)
Discharge Instructions:  1. You may shower, please wash incisions daily with soap and water and keep dry.  If you wish to cover wounds with dressing you may do so but please keep clean and change daily.  No tub baths or swimming until incisions have completely healed.  If your incisions become red or develop any drainage please call our office at [phone]  2. No Driving until cleared by Dr. Bartle's office and you are no longer using narcotic pain medications  3. Monitor your weight daily.. Please use the same scale and weigh at same time... If you gain 5-10 lbs in 48 hours with associated lower extremity swelling, please contact our office at [phone]  4. Fever of 101.5 for at least 24 hours with no source, please contact our office at [phone]  5. Activity- up as tolerated, please walk at least 3 times per day.  Avoid strenuous activity, no lifting, pushing, or pulling with your arms over 8-10 lbs for a minimum of 6 weeks  6. If any questions or concerns arise, please do not hesitate to contact our office at [phone]  ____________________________________________________________________________________________________________________________ Information on my medicine - ELIQUIS (apixaban)  This medication education was reviewed with me or my healthcare representative as part of my discharge preparation.  Why was Eliquis prescribed for you? Eliquis was prescribed for you to reduce the risk of a blood clot forming that can cause a stroke if you have a medical condition called atrial fibrillation (a type of irregular heartbeat).  What do You need to know about Eliquis ? Take your Eliquis TWICE DAILY - one tablet in the morning and one tablet in the evening with or without food. If you have difficulty swallowing the tablet whole please discuss with your pharmacist how to take the medication safely.  Take Eliquis exactly as prescribed by your doctor and DO NOT stop taking  Eliquis without talking to the doctor who prescribed the medication.  Stopping may increase your risk of developing a stroke.  Refill your prescription before you run out.  After discharge, you should have regular check-up appointments with your healthcare provider that is prescribing your Eliquis.  In the future your dose may need to be changed if your kidney function or weight changes by a significant amount or as you get older.  What do you do if you miss a dose? If you miss a dose, take it as soon as you remember on the same day and resume taking twice daily.  Do not take more than one dose of ELIQUIS at the same time to make up a missed dose.  Important Safety Information A possible side effect of Eliquis is bleeding. You should call your healthcare provider right away if you experience any of the following: Bleeding from an injury or your nose that does not stop. Unusual colored urine (red or dark brown) or unusual colored stools (red or black). Unusual bruising for unknown reasons. A serious fall or if you hit your head (even if there is no bleeding).  Some medicines may interact with Eliquis and might increase your risk of bleeding or clotting while on Eliquis. To help avoid this, consult your healthcare provider or pharmacist prior to using any new prescription or non-prescription medications, including herbals, vitamins, non-steroidal anti-inflammatory drugs (NSAIDs) and supplements.  This website has more information on Eliquis (apixaban): http://www.eliquis.com/eliquis/home  

## 2021-09-17 LAB — GLUCOSE, CAPILLARY
Glucose-Capillary: 107 mg/dL — ABNORMAL HIGH (ref 70–99)
Glucose-Capillary: 101 mg/dL — ABNORMAL HIGH (ref 70–99)
Glucose-Capillary: 149 mg/dL — ABNORMAL HIGH (ref 70–99)
Glucose-Capillary: 153 mg/dL — ABNORMAL HIGH (ref 70–99)

## 2021-09-17 LAB — BASIC METABOLIC PANEL
Anion gap: 12 (ref 5–15)
BUN: 18 mg/dL (ref 8–23)
CO2: 29 mmol/L (ref 22–32)
Calcium: 8.9 mg/dL (ref 8.9–10.3)
Chloride: 100 mmol/L (ref 98–111)
Creatinine, Ser: 1.57 mg/dL — ABNORMAL HIGH (ref 0.61–1.24)
GFR, Estimated: 50 mL/min — ABNORMAL LOW (ref >60)
Glucose, Bld: 110 mg/dL — ABNORMAL HIGH (ref 70–99)
Potassium: 3.7 mmol/L (ref 3.5–5.1)
Sodium: 141 mmol/L (ref 135–145)

## 2021-09-17 MED ORDER — POTASSIUM CHLORIDE CRYS ER 20 MEQ PO TBCR
20.0000 meq | EXTENDED_RELEASE_TABLET | Freq: Three times a day (TID) | ORAL | Status: DC
  Administered 2021-09-17 (×3): 20 meq via ORAL
  Filled 2021-09-17 (×3): qty 1

## 2021-09-17 MED ORDER — CHLORHEXIDINE GLUCONATE CLOTH 2 % EX PADS
6.0000 | MEDICATED_PAD | Freq: Every day | CUTANEOUS | Status: DC
  Administered 2021-09-17: 6 via TOPICAL

## 2021-09-17 MED ORDER — TORSEMIDE 20 MG PO TABS
20.0000 mg | ORAL_TABLET | Freq: Every day | ORAL | Status: DC
  Administered 2021-09-17: 20 mg via ORAL
  Filled 2021-09-17: qty 1

## 2021-09-17 MED ORDER — CARVEDILOL 12.5 MG PO TABS
12.5000 mg | ORAL_TABLET | Freq: Two times a day (BID) | ORAL | Status: DC
  Administered 2021-09-17 (×2): 12.5 mg via ORAL
  Filled 2021-09-17 (×2): qty 1

## 2021-09-17 NOTE — Progress Notes (Signed)
CARDIAC REHAB PHASE I   Offered to walk with pt. Pt declines at this time as he is trying to get some sleep. Encouraged ambulation, pt continues to deny. Will f/u to ambulate as time allows.  Rufina Falco, RN BSN 09/17/2021 1:30 PM

## 2021-09-17 NOTE — Progress Notes (Addendum)
TCTS DAILY ICU PROGRESS NOTE                   [address]            [place] [address]          [phone]   5 Days Post-Op Procedure(s) (LRB): CORONARY ARTERY BYPASS GRAFTING (CABG) X 5 USING LEFT INTERNAL MAMMARY ARTERY AND ENDOSCOPICALLY HARVESTED RIGHT GREATER SAPHENOUS VEIN. (N/A) TRANSESOPHAGEAL ECHOCARDIOGRAM (TEE) (N/A)  Total Length of Stay:  LOS: 5 days   Subjective: Patient sleeping and awakened. He has no specific complaint this am.  Objective: Vital signs in last 24 hours: Temp:  [97.6 F (36.4 C)-98.4 F (36.9 C)] 98.4 F (36.9 C) (08/08 0721) Pulse Rate:  [70-98] 98 (08/08 0600) Cardiac Rhythm: Normal sinus rhythm (08/07 1945) Resp:  [18-38] 38 (08/08 0600) BP: (127-170)/(63-108) 148/64 (08/08 0600) SpO2:  [88 %-96 %] 91 % (08/08 0600) Weight:  [102.3 kg] 102.3 kg (08/08 0500)  Filed Weights   09/15/21 0500 09/16/21 0500 09/17/21 0500  Weight: 105.3 kg 106.6 kg 102.3 kg    Weight change: -4.295 kg      Intake/Output from previous day: 08/07 0701 - 08/08 0700 In: -  Out: 2755 [Urine:2755]  Intake/Output this shift: No intake/output data recorded.  Current Meds: Scheduled Meds:  acetaminophen  1,000 mg Oral Q6H   Or   acetaminophen (TYLENOL) oral liquid 160 mg/5 mL  1,000 mg Per Tube Q6H   amiodarone  200 mg Oral BID   amLODipine  10 mg Oral Daily   aspirin EC  81 mg Oral Daily   atorvastatin  80 mg Oral Daily   carvedilol  12.5 mg Oral BID WC   Chlorhexidine Gluconate Cloth  6 each Topical Daily   clopidogrel  75 mg Oral Daily   [facility], Patient & Family Education   Does not apply Once   enoxaparin (LOVENOX) injection  40 mg Subcutaneous QHS   ferrous [id number] C-folic acid  1 capsule Oral BID PC   insulin aspart  0-24 Units Subcutaneous TID WC   insulin detemir  20 Units Subcutaneous Daily   pantoprazole  40 mg Oral Daily   potassium chloride  20 mEq Oral TID   sodium chloride flush  3  mL Intravenous Q12H   torsemide  20 mg Oral Daily   Continuous Infusions:  sodium chloride     PRN Meds:.sodium chloride, ondansetron (ZOFRAN) IV, oxyCODONE, sodium chloride flush, traMADol  General appearance: cooperative and no distress Heart: RRR Lungs: Slightly diminished bibasilar breath sounds Abdomen: Soft, obese, non tender, bowel sounds present Extremities: Mild LE edema Wound: Sternal and RLE wounds are clean and dry  Lab Results: CBC: Recent Labs    09/16/21 0511  WBC 11.7*  HGB 10.4*  HCT 31.8*  PLT 242   BMET:  Recent Labs    09/16/21 0511 09/17/21 0617  NA 141 141  K 3.6 3.7  CL 104 100  CO2 27 29  GLUCOSE 111* 110*  BUN 21 18  CREATININE 1.52* 1.57*  CALCIUM 8.1* 8.9    CMET: Lab Results  Component Value Date   WBC 11.7 (H) 09/16/2021   HGB 10.4 (L) 09/16/2021   HCT 31.8 (L) 09/16/2021   PLT 242 09/16/2021   GLUCOSE 110 (H) 09/17/2021   CHOL 250 (H) 03/12/2021   TRIG 538 (H) 03/12/2021   HDL 31 (L) 03/12/2021   LDLDIRECT 114.3 (H) 03/12/2021   [place] UNABLE  TO CALCULATE IF TRIGLYCERIDE OVER 400 mg/dL 03/12/2021   ALT 16 09/10/2021   AST 21 09/10/2021   NA 141 09/17/2021   K 3.7 09/17/2021   CL 100 09/17/2021   CREATININE 1.57 (H) 09/17/2021   BUN 18 09/17/2021   CO2 29 09/17/2021   TSH 0.460 03/12/2021   INR 1.3 (H) 09/12/2021   HGBA1C 6.5 (H) 09/10/2021      PT/INR: No results for input(s): "LABPROT", "INR" in the last 72 hours. Radiology: No results found.   Assessment/Plan: S/P Procedure(s) (LRB): CORONARY ARTERY BYPASS GRAFTING (CABG) X 5 USING LEFT INTERNAL MAMMARY ARTERY AND ENDOSCOPICALLY HARVESTED RIGHT GREATER SAPHENOUS VEIN. (N/A) TRANSESOPHAGEAL ECHOCARDIOGRAM (TEE) (N/A) CV-Previous atrial ectopy. SR this am. On Amiodarone 200 mg bid, Amlodipine 10 mg daily, Coreg 12.5 mg bid, and Plavix (history of stroke). Pulmonary-on 3 liters of oxygen via [place]. Wean as able. Encourage incentive spirometer. Volume overload-on  Demadex 20 mg daily Expected post op blood loss anemia-Last H and H stable at 10.4 and 31.8 Continue Trinsicon DM-CBGs 113/135/107. On Insulin. Pre op HGA1C 6.5. 6. Supplement potassium 7. CKD (Stage IIIA) -creatinine this am 1.57. Baseline appears around 1.5 (prior to admission). Chronic Kidney Disease   Stage I     GFR >90  Stage II    GFR 60-89  Stage IIIA GFR 45-59  Stage IIIB GFR 30-44  Stage IV   GFR 15-29  Stage V    GFR  <15  Lab Results  Component Value Date   CREATININE 1.57 (H) 09/17/2021   Estimated Creatinine Clearance: 56.3 mL/min (A) (by C-G formula based on SCr of 1.57 mg/dL (H)). 8. Waiting on bed to transfer to [facility] PA-C 09/17/2021 7:43 AM    Chart reviewed, patient examined, agree with above. He feels well and is ambulating without difficulty.  Diuresed well yesterday. Wt down 10 lbs if accurate ( which I doubt). BP is going up and Norvasc resumed last night. Will increase Coreg to 12.5 bid.

## 2021-09-17 NOTE — Progress Notes (Signed)
Nurse handoff given to Ronalee Belts, RN on 4E. Pt. transferred with all belongings including phone, charger, and personal bags. VSS. No complaints or pain, respiratory distress, or discomfort.

## 2021-09-18 LAB — GLUCOSE, CAPILLARY
Glucose-Capillary: 103 mg/dL — ABNORMAL HIGH (ref 70–99)
Glucose-Capillary: 115 mg/dL — ABNORMAL HIGH (ref 70–99)
Glucose-Capillary: 156 mg/dL — ABNORMAL HIGH (ref 70–99)
Glucose-Capillary: 118 mg/dL — ABNORMAL HIGH (ref 70–99)

## 2021-09-18 LAB — BASIC METABOLIC PANEL
Anion gap: 8 (ref 5–15)
BUN: 17 mg/dL (ref 8–23)
CO2: 30 mmol/L (ref 22–32)
Calcium: 9 mg/dL (ref 8.9–10.3)
Chloride: 103 mmol/L (ref 98–111)
Creatinine, Ser: 1.61 mg/dL — ABNORMAL HIGH (ref 0.61–1.24)
GFR, Estimated: 48 mL/min — ABNORMAL LOW (ref >60)
Glucose, Bld: 110 mg/dL — ABNORMAL HIGH (ref 70–99)
Potassium: 4.1 mmol/L (ref 3.5–5.1)
Sodium: 141 mmol/L (ref 135–145)

## 2021-09-18 MED ORDER — CARVEDILOL 25 MG PO TABS
25.0000 mg | ORAL_TABLET | Freq: Two times a day (BID) | ORAL | Status: DC
Start: 2021-09-18 — End: 2021-09-20
  Administered 2021-09-18 – 2021-09-20 (×5): 25 mg via ORAL
  Filled 2021-09-18 (×5): qty 1

## 2021-09-18 MED ORDER — POTASSIUM CHLORIDE CRYS ER 20 MEQ PO TBCR: 20.0000 meq | EXTENDED_RELEASE_TABLET | Freq: Every day | ORAL | Status: DC

## 2021-09-18 MED ORDER — TORSEMIDE 20 MG PO TABS: 20.0000 mg | ORAL_TABLET | Freq: Every day | ORAL | Status: DC

## 2021-09-18 NOTE — Progress Notes (Addendum)
6 Days Post-Op Procedure(s) (LRB): CORONARY ARTERY BYPASS GRAFTING (CABG) X 5 USING LEFT INTERNAL MAMMARY ARTERY AND ENDOSCOPICALLY HARVESTED RIGHT GREATER SAPHENOUS VEIN. (N/A) TRANSESOPHAGEAL ECHOCARDIOGRAM (TEE) (N/A) Subjective: Patient states he feels great this morning and walked three times yesterday. Only complaint is some numbness on left hand.  Objective: Vital signs in last 24 hours: Temp:  [98 F (36.7 C)-98.4 F (36.9 C)] 98 F (36.7 C) (08/09 0544) Pulse Rate:  [71-92] 88 (08/09 0544) Cardiac Rhythm: Normal sinus rhythm (08/08 1900) Resp:  [18-32] 20 (08/09 0544) BP: (127-159)/(63-89) 156/77 (08/09 0544) SpO2:  [91 %-99 %] 92 % (08/09 0544)  Hemodynamic parameters for last 24 hours:    Intake/Output from previous day: 08/08 0701 - 08/09 0700 In: 240 [P.O.:240] Out: 2 [Urine:1; Stool:1] Intake/Output this shift: No intake/output data recorded.  General appearance: alert, cooperative, and no distress Neurologic: intact Heart: Sinus rhythm with PVCs, normal rate, no murmur Lungs: Clear to auscultation bilaterally, slightly dull bibasilar breath sounds Abdomen: soft, non-tender; bowel sounds normal; no masses,  no organomegaly Extremities: extremities normal, atraumatic, no cyanosis or edema Wound: clean and dry  Lab Results: Recent Labs    09/16/21 0511  WBC 11.7*  HGB 10.4*  HCT 31.8*  PLT 242   BMET:  Recent Labs    09/17/21 0617 09/18/21 0253  NA 141 141  K 3.7 4.1  CL 100 103  CO2 29 30  GLUCOSE 110* 110*  BUN 18 17  CREATININE 1.57* 1.61*  CALCIUM 8.9 9.0    PT/INR: No results for input(s): "LABPROT", "INR" in the last 72 hours. ABG    Component Value Date/Time   PHART 7.356 09/13/2021 0133   HCO3 24.2 09/13/2021 0133   TCO2 26 09/13/2021 0133   ACIDBASEDEF 1.0 09/13/2021 0133   O2SAT 94 09/13/2021 0133   CBG (last 3)  Recent Labs    09/17/21 1618 09/17/21 2036 09/18/21 0545  GLUCAP 149* 153* 103*    Assessment/Plan: S/P  Procedure(s) (LRB): CORONARY ARTERY BYPASS GRAFTING (CABG) X 5 USING LEFT INTERNAL MAMMARY ARTERY AND ENDOSCOPICALLY HARVESTED RIGHT GREATER SAPHENOUS VEIN. (N/A) TRANSESOPHAGEAL ECHOCARDIOGRAM (TEE) (N/A)  CV: Patient continues to have PVCs, on amiodarone 200mg , amlodipine 10mg  and Coreg 12.5 BID. Blood pressure 156/77 today and HR 86. Will increase Coreg to 25mg  BID. Pulm: Saturating at 92% on RA today. Walked 3 times yesterday. Continue ambulation and IS. GI: Increased appetite today, loose stools yesterday. Was ready for another BM today. Glucose controlled 149/153/103 continue sliding scale insulin. Renal: History of CKD, Creatinine increased to 1.61 today from 1.57 yesterday with baseline around 1.5. Most likely due to diuresis. Will hold off on diuretics today and restart tomorrow. Extremities: Minimal to no edema. Patient close to admission weight. No diuretics today. Dispo: Patient can likely be discharged within the next couple days    LOS: 6 days    Magdalene River, PA-C 09/18/2021   Chart reviewed, patient examined, agree with above. He feels well overall. Wt is about back to baseline if accurate. Creat up slightly today. Will hold diuretic today and resume tomorrow if creat stable. He was taking prn lasix at home. BP still elevated so Coreg dose increased. I would not use ACE or ARB at this time with renal dysfunction.

## 2021-09-18 NOTE — Progress Notes (Signed)
CARDIAC REHAB PHASE I   PRE:  Rate/Rhythm: 105 ST  BP:  Sitting: 167/68      SaO2: 96 RA  MODE:  Ambulation: 240 ft   POST:  Rate/Rhythm: 102 ST  BP:  Sitting: 155/72      SaO2: 96 RA  Pt ambulated using front wheel walker standby assist only. Tolerated well with no CP or SOB. Back to room to chair by sink for wash up. Home education including move in tub, sternal precautions, risk factors, exercise guidelines, restrictions, heart healthy diabetic diet, home needs, and CRP2. Referral will be sent to [facility]. All questions and concerns addressed. Will continue to follow.   [phone]  Vanessa Barbara, RN BSN 09/18/2021 12:08 PM

## 2021-09-18 NOTE — Progress Notes (Signed)
CARDIAC REHAB PHASE I      Offered to walk with pt. He wants to rest for awhile. He has been up to bathroom few times but no walks this morning. Encouraged walking 3 times per day and IS use. He would like to walk in one hour. Will return as time allows.   [id number]  Vanessa Barbara, RN BSN 09/18/2021 10:23 AM

## 2021-09-18 NOTE — Progress Notes (Signed)
Mobility Specialist - Progress Note    09/18/21 1631  Mobility  Activity Ambulated with assistance in hallway  Level of Assistance Modified independent, requires aide device or extra time  Assistive Device Front wheel walker  Distance Ambulated (ft) 330 ft  Activity Response Tolerated well  $Mobility charge 1 Mobility   Pt received in bed, sleeping. Upon waking up was agreeable to mobility. Pt asymptomatic throughout ambulation. Pt returned to bed w/ call bell & phone.     Pre-mobility: HR, BP, SpO2 During mobility: HR, BP, SpO2 Post-mobility: 95bpm HR, 128/64 BP, 96% SPO2  Set designer

## 2021-09-19 ENCOUNTER — Other Ambulatory Visit (HOSPITAL_COMMUNITY): Payer: Self-pay

## 2021-09-19 ENCOUNTER — Telehealth (HOSPITAL_COMMUNITY): Payer: Self-pay | Admitting: Pharmacy Technician

## 2021-09-19 ENCOUNTER — Telehealth: Payer: Self-pay

## 2021-09-19 ENCOUNTER — Other Ambulatory Visit: Payer: Self-pay

## 2021-09-19 LAB — BASIC METABOLIC PANEL
Anion gap: 8 (ref 5–15)
BUN: 20 mg/dL (ref 8–23)
CO2: 29 mmol/L (ref 22–32)
Calcium: 9.1 mg/dL (ref 8.9–10.3)
Chloride: 102 mmol/L (ref 98–111)
Creatinine, Ser: 1.68 mg/dL — ABNORMAL HIGH (ref 0.61–1.24)
GFR, Estimated: 46 mL/min — ABNORMAL LOW (ref >60)
Glucose, Bld: 113 mg/dL — ABNORMAL HIGH (ref 70–99)
Potassium: 3.8 mmol/L (ref 3.5–5.1)
Sodium: 139 mmol/L (ref 135–145)

## 2021-09-19 LAB — GLUCOSE, CAPILLARY
Glucose-Capillary: 99 mg/dL (ref 70–99)
Glucose-Capillary: 134 mg/dL — ABNORMAL HIGH (ref 70–99)
Glucose-Capillary: 112 mg/dL — ABNORMAL HIGH (ref 70–99)
Glucose-Capillary: 137 mg/dL — ABNORMAL HIGH (ref 70–99)
Glucose-Capillary: 138 mg/dL — ABNORMAL HIGH (ref 70–99)

## 2021-09-19 MED ORDER — TRAMADOL HCL 50 MG PO TABS: 50.0000 mg | ORAL_TABLET | Freq: Four times a day (QID) | ORAL | Status: DC | PRN

## 2021-09-19 MED ORDER — APIXABAN 5 MG PO TABS
5.0000 mg | ORAL_TABLET | Freq: Two times a day (BID) | ORAL | Status: DC
  Administered 2021-09-19 – 2021-09-20 (×3): 5 mg via ORAL
  Filled 2021-09-19 (×3): qty 1

## 2021-09-19 MED ORDER — POTASSIUM CHLORIDE CRYS ER 20 MEQ PO TBCR
40.0000 meq | EXTENDED_RELEASE_TABLET | Freq: Once | ORAL | Status: AC
  Administered 2021-09-19: 40 meq via ORAL
  Filled 2021-09-19: qty 2

## 2021-09-19 NOTE — Plan of Care (Signed)
  Problem: Health Behavior/Discharge Planning: Goal: Ability to manage health-related needs will improve Outcome: Progressing   Problem: Clinical Measurements: Goal: Will remain free from infection Outcome: Progressing   

## 2021-09-19 NOTE — Telephone Encounter (Signed)
Pharmacy Patient Advocate Encounter  Insurance verification completed.    The patient does not have any prescription coverage  The patient is currently admitted and ran test claims for the following: Eliquis.  Copays and coinsurance results were relayed to Inpatient clinical team.  

## 2021-09-19 NOTE — Progress Notes (Signed)
Mobility Specialist - Progress Note   Pre-mobility: 80 bpm HR, 116/84 (92) BP, 96%  SpO2 Post-mobility: 87 bpm HR, 157/75 (98) BP, 96% SPO2     09/19/21 1129  Mobility  Activity Ambulated with assistance in hallway  Level of Assistance Modified independent, requires aide device or extra time  Assistive Device Front wheel walker  Distance Ambulated (ft) 360 ft  Activity Response Tolerated well  $Mobility charge 1 Mobility   Pt was found in bed with NT in room and was agreeable to mobilize. Got up from bed IND and stated having mild pain when coughing. At EOS was able to get back in bed and had all necessities within reach.  Ferd Hibbs Mobility Specialist

## 2021-09-19 NOTE — Telephone Encounter (Signed)
Patient called an informed. Thank you, friend!

## 2021-09-19 NOTE — Telephone Encounter (Signed)
OZEMPIC PATIENT ASSISTANCE FROM [facility] HAS BEEN RECEIVED AND IS AVAILABLE FOR PATIENT PICK UP.

## 2021-09-19 NOTE — Progress Notes (Signed)
7 Days Post-Op Procedure(s) (LRB): CORONARY ARTERY BYPASS GRAFTING (CABG) X 5 USING LEFT INTERNAL MAMMARY ARTERY AND ENDOSCOPICALLY HARVESTED RIGHT GREATER SAPHENOUS VEIN. (N/A) TRANSESOPHAGEAL ECHOCARDIOGRAM (TEE) (N/A) Subjective: No complaints. He feels like he is ready to go home.  He has had multiple short bursts of atrial fib with rate up to 120's this am which he has not been having but did have a lot of atrial ectopy postop and was started on amiodarone for that.  Objective: Vital signs in last 24 hours: Temp:  [97.9 F (36.6 C)-98.6 F (37 C)] 98.4 F (36.9 C) (08/10 0340) Pulse Rate:  [68-123] 68 (08/09 2023) Cardiac Rhythm: Normal sinus rhythm (08/09 1920) Resp:  [19-20] 20 (08/10 0340) BP: (110-167)/(60-110) 129/110 (08/10 0340) SpO2:  [93 %-100 %] 95 % (08/10 0340) Weight:  [101.2 kg] 101.2 kg (08/10 0335)  Hemodynamic parameters for last 24 hours:    Intake/Output from previous day: 08/09 0701 - 08/10 0700 In: 7341 [P.O.:240; I.V.:1239] Out: -  Intake/Output this shift: No intake/output data recorded.  General appearance: alert and cooperative Neurologic: intact Heart: regular rate and rhythm Lungs: clear to auscultation bilaterally Extremities: no edema Wound: incisions ok  Lab Results: No results for input(s): "WBC", "HGB", "HCT", "PLT" in the last 72 hours. BMET:  Recent Labs    09/18/21 0253 09/19/21 0542  NA 141 139  K 4.1 3.8  CL 103 102  CO2 30 29  GLUCOSE 110* 113*  BUN 17 20  CREATININE 1.61* 1.68*  CALCIUM 9.0 9.1    PT/INR: No results for input(s): "LABPROT", "INR" in the last 72 hours. ABG    Component Value Date/Time   PHART 7.356 09/13/2021 0133   HCO3 24.2 09/13/2021 0133   TCO2 26 09/13/2021 0133   ACIDBASEDEF 1.0 09/13/2021 0133   O2SAT 94 09/13/2021 0133   CBG (last 3)  Recent Labs    09/18/21 1632 09/18/21 2026 09/19/21 0624  GLUCAP 156* 118* 99    Assessment/Plan: S/P Procedure(s) (LRB): CORONARY ARTERY  BYPASS GRAFTING (CABG) X 5 USING LEFT INTERNAL MAMMARY ARTERY AND ENDOSCOPICALLY HARVESTED RIGHT GREATER SAPHENOUS VEIN. (N/A) TRANSESOPHAGEAL ECHOCARDIOGRAM (TEE) (N/A)  POD 7 He has been hemodynamically stable on Coreg and Norvasc.  Postop atrial fib: maintaining sinus most of the time but having some bursts of AF up to 120's this am. It may be because he is at the end of his 12 hr dose cycle. Will continue current regimen with amio and Coreg. I think he should be anticoagulated with AF, hx of prior strokes. Will start Eliquis and DC Plavix. Continue ASA 81.  His wt is below preop and he has no significant edema with creat up a little. Will stop diuretic.  DM: glucose under good control. Resume his home regimen at discharge.   Watch his heart rhythm today and if relatively stable he could go home tomorrow.   LOS: 7 days    [name] 09/19/2021

## 2021-09-20 ENCOUNTER — Other Ambulatory Visit: Payer: Self-pay

## 2021-09-20 ENCOUNTER — Other Ambulatory Visit (HOSPITAL_COMMUNITY): Payer: Self-pay

## 2021-09-20 LAB — GLUCOSE, CAPILLARY: Glucose-Capillary: 113 mg/dL — ABNORMAL HIGH (ref 70–99)

## 2021-09-20 MED ORDER — AMIODARONE HCL 200 MG PO TABS
ORAL_TABLET | ORAL | 1 refills | Status: DC
  Filled 2021-09-20: qty 60, 58d supply, fill #0

## 2021-09-20 MED ORDER — OXYCODONE HCL 5 MG PO TABS
5.0000 mg | ORAL_TABLET | Freq: Four times a day (QID) | ORAL | 0 refills | Status: DC | PRN
  Filled 2021-09-20: qty 30, 8d supply, fill #0

## 2021-09-20 MED ORDER — APIXABAN 5 MG PO TABS
5.0000 mg | ORAL_TABLET | Freq: Two times a day (BID) | ORAL | 1 refills | Status: DC
  Filled 2021-09-20: qty 60, 30d supply, fill #0

## 2021-09-20 MED ORDER — ASPIRIN 81 MG PO TBEC: 81.0000 mg | DELAYED_RELEASE_TABLET | Freq: Every day | ORAL | 12 refills | Status: DC

## 2021-09-20 MED ORDER — CARVEDILOL 25 MG PO TABS
25.0000 mg | ORAL_TABLET | Freq: Two times a day (BID) | ORAL | 1 refills | Status: DC
  Filled 2021-09-20: qty 120, 60d supply, fill #0

## 2021-09-20 MED ORDER — FERROUS SULFATE 325 (65 FE) MG PO TABS: 325.0000 mg | ORAL_TABLET | Freq: Every day | ORAL | 3 refills | Status: DC

## 2021-09-20 NOTE — Progress Notes (Signed)
CARDIAC REHAB PHASE I     Pt up getting packed up ready for discharge home. Reinforced OHS teaching, stressed sternal precautions, IS use, and move in tub. Pt will have family at home for help around the house. No questions or concerns.   [phone]  Vanessa Barbara, RN BSN 09/20/2021 8:37 AM

## 2021-09-20 NOTE — TOC Transition Note (Signed)
Transition of Care (TOC) - CM/SW Discharge Note [name] RN, BSN Transitions of Care Unit 4E- RN Case Manager See Treatment Team for direct phone #    Patient Details  Name: [name] MRN: [id number] Date of Birth: 12/02/1960  Transition of Care [facility]) CM/SW Contact:  Dawayne Patricia, RN Phone Number: 09/20/2021, 11:57 AM   Clinical Narrative:    Pt stable for transition home today, [employer] to deliver DME-RW to room this am for home.  No further TOC needs noted.     Final next level of care: Home/Self Care Barriers to Discharge: No Barriers Identified   Patient Goals and CMS Choice     Choice offered to / list presented to : NA  Discharge Placement                 Home      Discharge Plan and Services   Discharge Planning Services: CM Consult Post Acute Care Choice: Durable Medical Equipment [facility] DME)          DME Arranged: Gilford Rile rolling DME Agency: [employer] Date DME Agency Contacted: 09/19/21 Time DME Agency Contacted: [phone] Representative spoke with at DME Agency: Jodell Cipro HH Arranged: NA [place] Agency: NA        Social Determinants of Health ([place]) Interventions     Readmission Risk Interventions    09/20/2021   11:57 AM  Readmission Risk Prevention Plan  Post Dischage Appt Complete  Medication Screening Complete  Transportation Screening Complete

## 2021-09-20 NOTE — Progress Notes (Signed)
8 Days Post-Op Procedure(s) (LRB): CORONARY ARTERY BYPASS GRAFTING (CABG) X 5 USING LEFT INTERNAL MAMMARY ARTERY AND ENDOSCOPICALLY HARVESTED RIGHT GREATER SAPHENOUS VEIN. (N/A) TRANSESOPHAGEAL ECHOCARDIOGRAM (TEE) (N/A) Subjective: Patient feels good and is ready to go home. No complaints.  Objective: Vital signs in last 24 hours: Temp:  [97.8 F (36.6 C)-98.6 F (37 C)] 98.2 F (36.8 C) (08/11 0553) Pulse Rate:  [71-87] 74 (08/11 0553) Cardiac Rhythm: Normal sinus rhythm (08/10 2200) Resp:  [17-20] 20 (08/11 0553) BP: (111-142)/(64-87) 142/83 (08/11 0553) SpO2:  [90 %-96 %] 96 % (08/11 0553) Weight:  [101 kg] 101 kg (08/11 0553)  Hemodynamic parameters for last 24 hours:    Intake/Output from previous day: 08/10 0701 - 08/11 0700 In: 243 [P.O.:240; I.V.:3] Out: -  Intake/Output this shift: No intake/output data recorded.  General appearance: alert, cooperative, and no distress Neurologic: intact Heart: regular rate and rhythm, S1, S2 normal, no murmur, click, rub or gallop Lungs: clear to auscultation bilaterally Abdomen: soft, non-tender; bowel sounds normal; no masses,  no organomegaly Extremities: extremities normal, atraumatic, no cyanosis or edema Wound: Clean and dry  Lab Results: No results for input(s): "WBC", "HGB", "HCT", "PLT" in the last 72 hours. BMET:  Recent Labs    09/18/21 0253 09/19/21 0542  NA 141 139  K 4.1 3.8  CL 103 102  CO2 30 29  GLUCOSE 110* 113*  BUN 17 20  CREATININE 1.61* 1.68*  CALCIUM 9.0 9.1    PT/INR: No results for input(s): "LABPROT", "INR" in the last 72 hours. ABG    Component Value Date/Time   PHART 7.356 09/13/2021 0133   HCO3 24.2 09/13/2021 0133   TCO2 26 09/13/2021 0133   ACIDBASEDEF 1.0 09/13/2021 0133   O2SAT 94 09/13/2021 0133   CBG (last 3)  Recent Labs    09/19/21 1645 09/19/21 2107 09/20/21 0552  GLUCAP 137* 138* 113*    Assessment/Plan: S/P Procedure(s) (LRB): CORONARY ARTERY BYPASS GRAFTING  (CABG) X 5 USING LEFT INTERNAL MAMMARY ARTERY AND ENDOSCOPICALLY HARVESTED RIGHT GREATER SAPHENOUS VEIN. (N/A) TRANSESOPHAGEAL ECHOCARDIOGRAM (TEE) (N/A)  CV: Sinus rhythm this morning with no bursts of atrial fibrillation last night and rate is in the 70's-80's. On Eloquis for stroke prevention. Blood pressure slightly elevated 140's/80's most likely due to CKD, he is on Norvasc 10mg  and Coreg 25mg  BID.  Pulm: Satting at 96% on room air, walked twice yesterday. Encourage ambulation and IS.  Renal: History of CKD, Cr 1.68 stable, only slightly over baseline. Minimal to no edema and patient is under admission weight so diuretics have been stopped  GI: Glucose stable 137/138/113. Will resume home diabetes medication at discharge.  Dispo: D/C to home today.    LOS: 8 days    Magdalene River 09/20/2021

## 2021-09-20 NOTE — Plan of Care (Signed)
  Problem: Education: Goal: Knowledge of General Education information will improve Description: Including pain rating scale, medication(s)/side effects and non-pharmacologic comfort measures Outcome: Completed/Met

## 2021-09-22 NOTE — Progress Notes (Deleted)
Office Visit    Patient Name: [name] Date of Encounter: 09/22/2021  Primary Care Provider:  [name], MD Primary Cardiologist:  Freada Bergeron, MD Primary Electrophysiologist: None  Chief Complaint    [name] is a 61 y.o. 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  Past Medical History    Past Medical History:  Diagnosis Date   AKI (acute kidney injury) ([place])    pt unaware of this   Anginal pain ([place])    CAD (coronary artery disease)    a. 03/2015 NSTEMI: LHC with severe 3V CAD  (70% mid RCA, 95% OM1, 90% distal LCx, 90% OM3, 80% prox LAD and 90% ost D1) s/p DES to mLAD w/ small dissction Rx with DES, staged ost Ramus PCI/DES and dLCx s/p PCI/DES    Chest pain 12/24/2020   Diabetes mellitus type 2 in obese [facility]    Diverticulosis    Dyspnea on exertion 03/16/2015   Dyspnea on exertion   Family history of adverse reaction to anesthesia    patient father- pt states after anesthesia his father "developed dementia"   Hypercholesteremia    Hypertension associated with diabetes ([place]) 03/16/2015   hypertension   NSTEMI (non-ST elevated myocardial infarction) ([place]) 03/17/2015   Obesity    Stroke ([place]) 2022   pt states he had a "mini stroke" during cardiac catheterization   Tobacco abuse    Past Surgical History:  Procedure Laterality Date   CARDIAC CATHETERIZATION N/A 03/19/2015   Procedure: Left Heart Cath and Coronary Angiography;  Surgeon: Lorretta Harp, MD;  Location: [place] [facility];  Service: Cardiovascular;  Laterality: N/A;   CARDIAC CATHETERIZATION N/A 03/20/2015   Procedure: Coronary Stent Intervention;  Surgeon: Lorretta Harp, MD;  Location: [place] [facility];  Service: Cardiovascular;  Laterality: N/A;   CARDIAC CATHETERIZATION N/A 03/22/2015   Procedure: Coronary Stent Intervention;  Surgeon: Lorretta Harp, MD;  Location: [place] [facility];  Service: Cardiovascular;  Laterality: N/A;   CORONARY ARTERY BYPASS GRAFT N/A 09/12/2021   Procedure: CORONARY ARTERY BYPASS GRAFTING (CABG) X 5 USING LEFT INTERNAL MAMMARY ARTERY AND ENDOSCOPICALLY HARVESTED RIGHT GREATER SAPHENOUS VEIN.;  Surgeon: [name], MD;  Location: [place];  Service: Open Heart Surgery;  Laterality: N/A;   LEFT HEART CATH AND CORONARY ANGIOGRAPHY N/A 12/25/2020   Procedure: LEFT HEART CATH AND CORONARY ANGIOGRAPHY;  Surgeon: Troy Sine, MD;  Location: [place] [facility];  Service: Cardiovascular;  Laterality: N/A;   LEFT HEART CATH AND CORONARY ANGIOGRAPHY N/A 12/26/2020   Procedure: LEFT HEART CATH AND CORONARY ANGIOGRAPHY;  Surgeon: Troy Sine, MD;  Location: [place] [facility];  Service: Cardiovascular;  Laterality: N/A;   TEE WITHOUT CARDIOVERSION N/A 09/12/2021   Procedure: TRANSESOPHAGEAL ECHOCARDIOGRAM (TEE);  Surgeon: [name], MD;  Location: [place];  Service: Open Heart Surgery;  Laterality: N/A;   TESTICLE SURGERY  1970   pt states testicle was ascended and had to be "pulled down"   TONSILLECTOMY     as a child    Allergies  Allergies  Allergen Reactions   Aspirin Other (See Comments)    Upset stomach  Pt states he is not allergic to aspirin    History of Present Illness    [name] is a 60 year old male with the  above-mentioned past medical history who presents today for hospital follow-up of CABG x5.  Patient was admitted 03/2015 with NSTEMI and underwent LHC which revealed3V CAD (70% mid RCA, 95% OM1, 90% distal LCx, 90% OM3, 80% prox LAD and 90% ost D1) with normal LV function.  Staged PCI with DES to mid LAD.  2D echo completed 2/5 with EF of 55-60%, grade 1 DD.  He was admitted for NSTEMI on 12/2020 and cath revealed significant multivessel CAD and patient was consulted for CABG.  During admission patient developed speech difficulties and CT of the head was completed and revealed  hypodensity of the right cerebellum.  MRI of the brain was obtained revealing petechiae hemorrhages and punctate infarct.  Neurology was consulted and diagnosis of embolic stroke was confirmed.  He was discharged and wanted to wait until January to complete CABG.  Seen in follow-up with complaint of chest pain that was relieved with nitroglycerin.  He was readmitted 03/2021 with aphasia and possible TIA.  Plavix had been discontinued due to plan for CABG.  CT and head MRI were unremarkable.  Patient had to cancel surgery in February due to elevated hemoglobin A1c of 15.5.  Patient was able to decrease this down to 7.5 and underwent surgery on 09/2021.  [name] underwent successful CABG x5 with left internal mammary grafting.  He was started on amiodarone drip due to atrial ectopy.  He converted to sinus rhythm and then was started on low-dose metoprolol.  Metoprolol was stopped and carvedilol was started due to hypertension on postop day 3.  His carvedilol was increased and Norvasc was also added with plan to continue oral amiodarone due to continued PVCs.  He began to have burst of AF and Plavix was switched to Eliquis and rate was controlled to the 80's with amiodarone.  Since last being seen in the office patient reports***.  Patient denies chest pain, palpitations, dyspnea, PND, orthopnea, nausea, vomiting, dizziness, syncope, edema, weight gain, or early satiety.     ***Notes: Cardiac Rehab GDMT:Lipitor,Coreg,Eliquis,  Cr: 1.68 K-3.8 Hgb:10.9 Follow up in 1 month Cardiac Rehab  Home Medications    Current Outpatient Medications  Medication Sig Dispense Refill   amiodarone (PACERONE) 200 MG tablet Take 1 tablet by mouth  twice daily for 2 days then take 1 tablet daily thereafter 60 tablet 1   amLODipine (NORVASC) 10 MG tablet Take 1 tablet (10 mg total) by mouth daily. 30 tablet 2   apixaban (ELIQUIS) 5 MG TABS tablet Take 1 tablet (5 mg total) by mouth 2 (two) times daily. 60 tablet 1    aspirin EC 81 MG tablet Take 1 tablet (81 mg total) by mouth daily. Swallow whole. 30 tablet 12   atorvastatin (LIPITOR) 80 MG tablet Take 1 tablet (80 mg total) by mouth daily. 30 tablet 2   blood glucose meter kit and supplies Dispense based on patient and insurance preference. Use up to four times daily as directed. (FOR ICD-10 E10.9, E11.9). 1 each 0   Blood Glucose Monitoring Suppl (TRUE METRIX METER) w/Device KIT Use to check blood sugar three times daily. 1 kit 0   carvedilol (COREG) 25 MG tablet Take 1 tablet (25 mg total) by mouth 2 (two) times daily with a meal. 60 tablet 1   Continuous Blood Gluc Sensor (FREESTYLE LIBRE 2 SENSOR) MISC Utilize as directed q 14 days to monitor blood sugar. 2 each 1   Dulaglutide (TRULICITY) 1.5 KZ/6.0FU SOPN Inject 1.5 mg into the skin once a  week. 2 mL 3   ferrous sulfate 325 (65 FE) MG tablet Take 1 tablet (325 mg total) by mouth daily with breakfast. Please take for 1 month, may stop sooner if develop constipation 30 tablet 3   glucose blood (TRUE METRIX BLOOD GLUCOSE TEST) test strip Use to check blood sugar three times daily. 100 each 2   Insulin Glargine (BASAGLAR KWIKPEN) 100 UNIT/ML Inject 62 Units into the skin daily. 15 mL 2   Insulin Pen Needle (TRUEPLUS 5-BEVEL PEN NEEDLES) 32G X 4 MM MISC Use to inject Basaglar once daily. 100 each 3   oxyCODONE (OXY IR/ROXICODONE) 5 MG immediate release tablet Take 1 tablet (5 mg total) by mouth every 6 (six) hours as needed for severe pain. 30 tablet 0   Semaglutide, 1 MG/DOSE, 4 MG/3ML SOPN Inject 1 mg as directed once a week. 3 mL 2   TRUEplus Lancets 28G MISC Use to check blood sugar three times daily. 100 each 2   No current facility-administered medications for this visit.     Review of Systems  Please see the history of present illness.    (+)*** (+)***  All other systems reviewed and are otherwise negative except as noted above.  Physical Exam    Wt Readings from Last 3 Encounters:  09/20/21  222 lb 10.6 oz (101 kg)  09/10/21 230 lb (104.3 kg)  08/21/21 222 lb (100.7 kg)   JG:GEZMO were no vitals filed for this visit.,There is no height or weight on file to calculate BMI.  Constitutional:      Appearance: Healthy appearance. Not in distress.  Neck:     Vascular: JVD normal.  Pulmonary:     Effort: Pulmonary effort is normal.     Breath sounds: No wheezing. No rales. Diminished in the bases Cardiovascular:     Normal rate. Regular rhythm. Normal S1. Normal S2.      Murmurs: There is no murmur.  Edema:    Peripheral edema absent.  Abdominal:     Palpations: Abdomen is soft non tender. There is no hepatomegaly.  Skin:    General: Skin is warm and dry.  Neurological:     General: No focal deficit present.     Mental Status: Alert and oriented to person, place and time.     Cranial Nerves: Cranial nerves are intact.  EKG/LABS/Other Studies Reviewed    ECG personally reviewed by me today - ***  Risk Assessment/Calculations:   {Does this patient have ATRIAL FIBRILLATION?:[phone]}        Lab Results  Component Value Date   WBC 11.7 (H) 09/16/2021   HGB 10.4 (L) 09/16/2021   HCT 31.8 (L) 09/16/2021   MCV 88.6 09/16/2021   PLT 242 09/16/2021   Lab Results  Component Value Date   CREATININE 1.68 (H) 09/19/2021   BUN 20 09/19/2021   NA 139 09/19/2021   K 3.8 09/19/2021   CL 102 09/19/2021   CO2 29 09/19/2021   Lab Results  Component Value Date   ALT 16 09/10/2021   AST 21 09/10/2021   ALKPHOS 75 09/10/2021   BILITOT 1.1 09/10/2021   Lab Results  Component Value Date   CHOL 250 (H) 03/12/2021   HDL 31 (L) 03/12/2021   LDLCALC UNABLE TO CALCULATE IF TRIGLYCERIDE OVER 400 mg/dL 03/12/2021   LDLDIRECT 114.3 (H) 03/12/2021   TRIG 538 (H) 03/12/2021   CHOLHDL 8.1 03/12/2021    Lab Results  Component Value Date   HGBA1C 6.5 (H) 09/10/2021  Assessment & Plan    1.  Atherosclerotic CAD: -Patient reports today *** -GDMT  2.  History of  CABG: -s/p CABG x5 with left internal mammary grafting.  3.  History of CVA -s/p embolic CVA following LHC in 2022 -Eliquis 5 mg BID -CBC and Cr   4.  Essential HTN: -B/P today was *** -Continue***  5.  Hyperlipidemia: -Last LDL was 114 on 02/2021 -Continue***  {The patient has an active order for outpatient cardiac rehabilitation.   Please indicate if the patient is ready to start. Do NOT delete this.  It will auto delete.  Refresh note, then sign.              Click here to document readiness and see contraindications.  :1}  Cardiac Rehabilitation Eligibility Assessment       Disposition: Follow-up with Freada Bergeron, MD or APP in *** months {Are you ordering a CV Procedure (e.g. stress test, cath, DCCV, TEE, etc)?   Press F2        :[id number]}   Medication Adjustments/Labs and Tests Ordered: Current medicines are reviewed at length with the patient today.  Concerns regarding medicines are outlined above.   Signed, Mable Fill, Marissa Nestle, NP 09/22/2021, 2:06 PM [place]

## 2021-09-23 ENCOUNTER — Telehealth: Payer: Self-pay

## 2021-09-23 NOTE — Telephone Encounter (Signed)
Transition Care Management Unsuccessful Follow-up Telephone Call  Date of discharge and from where:   09/20/2021, [facility]  Attempts:  1st Attempt  Reason for unsuccessful TCM follow-up call:  Left voice message on # [phone], call back requested.   Need to schedule a hospital follow up appointment with PCP.

## 2021-09-24 ENCOUNTER — Telehealth: Payer: Self-pay

## 2021-09-24 ENCOUNTER — Ambulatory Visit: Payer: Self-pay | Admitting: Pharmacist

## 2021-09-24 NOTE — Telephone Encounter (Signed)
Transition Care Management Unsuccessful Follow-up Telephone Call  Date of discharge and from where:  09/20/2021 , [facility]  Attempts:  2nd Attempt  Reason for unsuccessful TCM follow-up call:  Left voice message # [phone], call back requested.    Need to schedule a hospital follow up appointment with PCP.

## 2021-09-25 ENCOUNTER — Ambulatory Visit: Payer: Self-pay | Admitting: Nurse Practitioner

## 2021-09-25 ENCOUNTER — Telehealth: Payer: Self-pay | Admitting: Licensed Clinical Social Worker

## 2021-09-25 ENCOUNTER — Telehealth: Payer: Self-pay

## 2021-09-25 NOTE — Telephone Encounter (Signed)
H&V Care Navigation CSW Progress Note  Clinical Social Worker contacted patient by phone to f/u on missed appt this morning and need to call [employer] back for possible assistance w/ Medicaid. No answer at [phone], I left a message for pt to return my call. Will attempt 1x more.   Patient is participating in a Managed Medicaid Plan:  No, self pay only per current documentation  SDOH Screenings   Alcohol Screen: Not on file  Depression (PHQ2-9): Low Risk  (06/06/2021)   Depression (PHQ2-9)    PHQ-2 Score: 0  Financial Resource Strain: Not on file  Food Insecurity: Not on file  Housing: Not on file  Physical Activity: Not on file  Social Connections: Not on file  Stress: Not on file  Tobacco Use: Medium Risk (09/13/2021)   Patient History    Smoking Tobacco Use: Former    Smokeless Tobacco Use: Never    Passive Exposure: Not on file  Transportation Needs: Not on file   Westley Hummer, MSW, LCSW Clinical Social Worker II [place]  [phone]- work cell phone (preferred) [phone]- desk phone

## 2021-09-25 NOTE — Telephone Encounter (Signed)
Transition Care Management Unsuccessful Follow-up Telephone Call  Date of discharge and from where:  09/20/2021, [facility]  Attempts:  3rd Attempt  Reason for unsuccessful TCM follow-up call:  Left voice message on # [phone], call back requested.     Letter sent to patient requesting he contact Primary Care at [facility] to schedule a hospital follow up appointment as we have not been able to reach him.

## 2021-09-26 ENCOUNTER — Telehealth (HOSPITAL_BASED_OUTPATIENT_CLINIC_OR_DEPARTMENT_OTHER): Payer: Self-pay

## 2021-09-26 ENCOUNTER — Other Ambulatory Visit (HOSPITAL_BASED_OUTPATIENT_CLINIC_OR_DEPARTMENT_OTHER): Payer: Self-pay

## 2021-09-26 ENCOUNTER — Ambulatory Visit (INDEPENDENT_AMBULATORY_CARE_PROVIDER_SITE_OTHER): Payer: Self-pay

## 2021-09-26 ENCOUNTER — Other Ambulatory Visit: Payer: Self-pay | Admitting: *Deleted

## 2021-09-26 DIAGNOSIS — Z4802 Encounter for removal of sutures: Secondary | ICD-10-CM

## 2021-09-26 LAB — ECHO INTRAOPERATIVE TEE
AV Mean grad: 4 mmHg
AV Peak grad: 6.5 mmHg
Ao pk vel: 1.27 m/s
Height: 67 in
S' Lateral: 2.4 cm
Weight: 3600 oz

## 2021-09-26 NOTE — Telephone Encounter (Signed)
Transitions of Care Pharmacy   Call attempted for a pharmacy transitions of care follow-up. HIPAA appropriate voicemail was left with call back information provided.   Call attempt #1. Will follow-up in 2-3 days.   Darcus Austin, Tilden Ladd 09/26/2021 10:30 AM

## 2021-09-26 NOTE — Progress Notes (Signed)
Removed 3 sutures from chest tube sites/ EVH site on right lower extremity was open about 3 cm in length/ 1 cm width/ 1 cm in depth. No drainage. Packed the area with a wet to dry 2x2 gauze and covered with 4x4 dry gauze and tape. Patient was instructed to change dressing daily. He will f/u next week with PA for wound check and was instructed to call the office if he has questions.

## 2021-09-28 ENCOUNTER — Encounter (HOSPITAL_COMMUNITY)
Admission: EM | Disposition: A | Discharge status: Skilled Nursing Facility | Payer: Self-pay | Source: Home / Self Care | Attending: Surgery

## 2021-09-28 ENCOUNTER — Other Ambulatory Visit: Payer: Self-pay

## 2021-09-28 ENCOUNTER — Encounter (HOSPITAL_COMMUNITY): Payer: Self-pay | Admitting: *Deleted

## 2021-09-28 ENCOUNTER — Emergency Department (HOSPITAL_COMMUNITY): Payer: Self-pay

## 2021-09-28 ENCOUNTER — Inpatient Hospital Stay (HOSPITAL_COMMUNITY): Payer: Self-pay | Admitting: Certified Registered"

## 2021-09-28 ENCOUNTER — Inpatient Hospital Stay (HOSPITAL_COMMUNITY)
Admission: EM | Admit: 2021-09-28 | Discharge: 2021-12-03 | DRG: 001 | Disposition: A | Discharge status: Skilled Nursing Facility | Payer: Self-pay | Attending: Surgery | Admitting: Surgery

## 2021-09-28 ENCOUNTER — Inpatient Hospital Stay (HOSPITAL_COMMUNITY): Payer: Self-pay

## 2021-09-28 DIAGNOSIS — I241 Dressler's syndrome: Secondary | ICD-10-CM | POA: Diagnosis present

## 2021-09-28 DIAGNOSIS — I252 Old myocardial infarction: Secondary | ICD-10-CM

## 2021-09-28 DIAGNOSIS — E1169 Type 2 diabetes mellitus with other specified complication: Secondary | ICD-10-CM | POA: Diagnosis present

## 2021-09-28 DIAGNOSIS — I97631 Postprocedural hematoma of a circulatory system organ or structure following cardiac bypass: Secondary | ICD-10-CM | POA: Diagnosis present

## 2021-09-28 DIAGNOSIS — E870 Hyperosmolality and hypernatremia: Secondary | ICD-10-CM | POA: Diagnosis not present

## 2021-09-28 DIAGNOSIS — Y712 Prosthetic and other implants, materials and accessory cardiovascular devices associated with adverse incidents: Secondary | ICD-10-CM | POA: Diagnosis not present

## 2021-09-28 DIAGNOSIS — D62 Acute posthemorrhagic anemia: Secondary | ICD-10-CM | POA: Diagnosis present

## 2021-09-28 DIAGNOSIS — T82897A Other specified complication of cardiac prosthetic devices, implants and grafts, initial encounter: Principal | ICD-10-CM | POA: Diagnosis present

## 2021-09-28 DIAGNOSIS — Z597 Insufficient social insurance and welfare support: Secondary | ICD-10-CM

## 2021-09-28 DIAGNOSIS — Z992 Dependence on renal dialysis: Secondary | ICD-10-CM

## 2021-09-28 DIAGNOSIS — N186 End stage renal disease: Secondary | ICD-10-CM | POA: Diagnosis present

## 2021-09-28 DIAGNOSIS — R319 Hematuria, unspecified: Secondary | ICD-10-CM | POA: Diagnosis not present

## 2021-09-28 DIAGNOSIS — I314 Cardiac tamponade: Secondary | ICD-10-CM | POA: Diagnosis present

## 2021-09-28 DIAGNOSIS — D6489 Other specified anemias: Secondary | ICD-10-CM | POA: Diagnosis not present

## 2021-09-28 DIAGNOSIS — Z886 Allergy status to analgesic agent status: Secondary | ICD-10-CM

## 2021-09-28 DIAGNOSIS — Z7982 Long term (current) use of aspirin: Secondary | ICD-10-CM

## 2021-09-28 DIAGNOSIS — Z9281 Personal history of extracorporeal membrane oxygenation (ECMO): Secondary | ICD-10-CM

## 2021-09-28 DIAGNOSIS — E1165 Type 2 diabetes mellitus with hyperglycemia: Secondary | ICD-10-CM | POA: Diagnosis not present

## 2021-09-28 DIAGNOSIS — J9811 Atelectasis: Secondary | ICD-10-CM | POA: Diagnosis present

## 2021-09-28 DIAGNOSIS — N189 Chronic kidney disease, unspecified: Secondary | ICD-10-CM

## 2021-09-28 DIAGNOSIS — I3139 Other pericardial effusion (noninflammatory): Secondary | ICD-10-CM

## 2021-09-28 DIAGNOSIS — J9602 Acute respiratory failure with hypercapnia: Secondary | ICD-10-CM | POA: Diagnosis not present

## 2021-09-28 DIAGNOSIS — Y832 Surgical operation with anastomosis, bypass or graft as the cause of abnormal reaction of the patient, or of later complication, without mention of misadventure at the time of the procedure: Secondary | ICD-10-CM | POA: Diagnosis present

## 2021-09-28 DIAGNOSIS — R5381 Other malaise: Secondary | ICD-10-CM

## 2021-09-28 DIAGNOSIS — S80211A Abrasion, right knee, initial encounter: Secondary | ICD-10-CM | POA: Diagnosis not present

## 2021-09-28 DIAGNOSIS — I462 Cardiac arrest due to underlying cardiac condition: Secondary | ICD-10-CM | POA: Diagnosis not present

## 2021-09-28 DIAGNOSIS — I469 Cardiac arrest, cause unspecified: Secondary | ICD-10-CM

## 2021-09-28 DIAGNOSIS — D631 Anemia in chronic kidney disease: Secondary | ICD-10-CM | POA: Diagnosis present

## 2021-09-28 DIAGNOSIS — Z87891 Personal history of nicotine dependence: Secondary | ICD-10-CM

## 2021-09-28 DIAGNOSIS — I25119 Atherosclerotic heart disease of native coronary artery with unspecified angina pectoris: Secondary | ICD-10-CM | POA: Diagnosis present

## 2021-09-28 DIAGNOSIS — Z8673 Personal history of transient ischemic attack (TIA), and cerebral infarction without residual deficits: Secondary | ICD-10-CM

## 2021-09-28 DIAGNOSIS — I319 Disease of pericardium, unspecified: Secondary | ICD-10-CM

## 2021-09-28 DIAGNOSIS — L89153 Pressure ulcer of sacral region, stage 3: Secondary | ICD-10-CM | POA: Diagnosis not present

## 2021-09-28 DIAGNOSIS — G928 Other toxic encephalopathy: Secondary | ICD-10-CM | POA: Diagnosis not present

## 2021-09-28 DIAGNOSIS — R571 Hypovolemic shock: Secondary | ICD-10-CM | POA: Diagnosis not present

## 2021-09-28 DIAGNOSIS — E669 Obesity, unspecified: Secondary | ICD-10-CM | POA: Diagnosis present

## 2021-09-28 DIAGNOSIS — T82868A Thrombosis of vascular prosthetic devices, implants and grafts, initial encounter: Secondary | ICD-10-CM | POA: Diagnosis not present

## 2021-09-28 DIAGNOSIS — Z794 Long term (current) use of insulin: Secondary | ICD-10-CM

## 2021-09-28 DIAGNOSIS — F05 Delirium due to known physiological condition: Secondary | ICD-10-CM | POA: Diagnosis not present

## 2021-09-28 DIAGNOSIS — I442 Atrioventricular block, complete: Secondary | ICD-10-CM | POA: Diagnosis present

## 2021-09-28 DIAGNOSIS — Z9889 Other specified postprocedural states: Secondary | ICD-10-CM | POA: Diagnosis present

## 2021-09-28 DIAGNOSIS — I312 Hemopericardium, not elsewhere classified: Secondary | ICD-10-CM | POA: Diagnosis present

## 2021-09-28 DIAGNOSIS — K567 Ileus, unspecified: Secondary | ICD-10-CM | POA: Diagnosis not present

## 2021-09-28 DIAGNOSIS — Z6837 Body mass index (BMI) 37.0-37.9, adult: Secondary | ICD-10-CM

## 2021-09-28 DIAGNOSIS — R001 Bradycardia, unspecified: Principal | ICD-10-CM

## 2021-09-28 DIAGNOSIS — E1122 Type 2 diabetes mellitus with diabetic chronic kidney disease: Secondary | ICD-10-CM

## 2021-09-28 DIAGNOSIS — G9341 Metabolic encephalopathy: Secondary | ICD-10-CM

## 2021-09-28 DIAGNOSIS — Z8249 Family history of ischemic heart disease and other diseases of the circulatory system: Secondary | ICD-10-CM

## 2021-09-28 DIAGNOSIS — I152 Hypertension secondary to endocrine disorders: Secondary | ICD-10-CM | POA: Diagnosis present

## 2021-09-28 DIAGNOSIS — T45515A Adverse effect of anticoagulants, initial encounter: Secondary | ICD-10-CM | POA: Diagnosis not present

## 2021-09-28 DIAGNOSIS — E8729 Other acidosis: Secondary | ICD-10-CM | POA: Diagnosis not present

## 2021-09-28 DIAGNOSIS — W06XXXA Fall from bed, initial encounter: Secondary | ICD-10-CM | POA: Diagnosis not present

## 2021-09-28 DIAGNOSIS — Z7985 Long-term (current) use of injectable non-insulin antidiabetic drugs: Secondary | ICD-10-CM

## 2021-09-28 DIAGNOSIS — Z955 Presence of coronary angioplasty implant and graft: Secondary | ICD-10-CM

## 2021-09-28 DIAGNOSIS — E11649 Type 2 diabetes mellitus with hypoglycemia without coma: Secondary | ICD-10-CM | POA: Diagnosis present

## 2021-09-28 DIAGNOSIS — I484 Atypical atrial flutter: Secondary | ICD-10-CM

## 2021-09-28 DIAGNOSIS — L8990 Pressure ulcer of unspecified site, unspecified stage: Secondary | ICD-10-CM | POA: Insufficient documentation

## 2021-09-28 DIAGNOSIS — I5043 Acute on chronic combined systolic (congestive) and diastolic (congestive) heart failure: Secondary | ICD-10-CM | POA: Diagnosis present

## 2021-09-28 DIAGNOSIS — I132 Hypertensive heart and chronic kidney disease with heart failure and with stage 5 chronic kidney disease, or end stage renal disease: Secondary | ICD-10-CM | POA: Diagnosis present

## 2021-09-28 DIAGNOSIS — N170 Acute kidney failure with tubular necrosis: Secondary | ICD-10-CM | POA: Diagnosis present

## 2021-09-28 DIAGNOSIS — I429 Cardiomyopathy, unspecified: Secondary | ICD-10-CM

## 2021-09-28 DIAGNOSIS — E875 Hyperkalemia: Secondary | ICD-10-CM | POA: Diagnosis not present

## 2021-09-28 DIAGNOSIS — L89322 Pressure ulcer of left buttock, stage 2: Secondary | ICD-10-CM | POA: Diagnosis not present

## 2021-09-28 DIAGNOSIS — D6832 Hemorrhagic disorder due to extrinsic circulating anticoagulants: Secondary | ICD-10-CM | POA: Diagnosis not present

## 2021-09-28 DIAGNOSIS — I480 Paroxysmal atrial fibrillation: Secondary | ICD-10-CM | POA: Diagnosis present

## 2021-09-28 DIAGNOSIS — R1312 Dysphagia, oropharyngeal phase: Secondary | ICD-10-CM | POA: Diagnosis present

## 2021-09-28 DIAGNOSIS — I495 Sick sinus syndrome: Secondary | ICD-10-CM | POA: Diagnosis not present

## 2021-09-28 DIAGNOSIS — I129 Hypertensive chronic kidney disease with stage 1 through stage 4 chronic kidney disease, or unspecified chronic kidney disease: Secondary | ICD-10-CM

## 2021-09-28 DIAGNOSIS — R1310 Dysphagia, unspecified: Secondary | ICD-10-CM

## 2021-09-28 DIAGNOSIS — I4892 Unspecified atrial flutter: Secondary | ICD-10-CM | POA: Diagnosis not present

## 2021-09-28 DIAGNOSIS — Z5320 Procedure and treatment not carried out because of patient's decision for unspecified reasons: Secondary | ICD-10-CM | POA: Diagnosis not present

## 2021-09-28 DIAGNOSIS — Z823 Family history of stroke: Secondary | ICD-10-CM

## 2021-09-28 DIAGNOSIS — R570 Cardiogenic shock: Secondary | ICD-10-CM

## 2021-09-28 DIAGNOSIS — Z86711 Personal history of pulmonary embolism: Secondary | ICD-10-CM

## 2021-09-28 DIAGNOSIS — H1130 Conjunctival hemorrhage, unspecified eye: Secondary | ICD-10-CM | POA: Diagnosis not present

## 2021-09-28 DIAGNOSIS — D75838 Other thrombocytosis: Secondary | ICD-10-CM | POA: Diagnosis not present

## 2021-09-28 DIAGNOSIS — Y92230 Patient room in hospital as the place of occurrence of the external cause: Secondary | ICD-10-CM | POA: Diagnosis not present

## 2021-09-28 DIAGNOSIS — E8721 Acute metabolic acidosis: Secondary | ICD-10-CM | POA: Diagnosis not present

## 2021-09-28 DIAGNOSIS — I2510 Atherosclerotic heart disease of native coronary artery without angina pectoris: Secondary | ICD-10-CM

## 2021-09-28 DIAGNOSIS — E871 Hypo-osmolality and hyponatremia: Secondary | ICD-10-CM | POA: Diagnosis not present

## 2021-09-28 DIAGNOSIS — L89312 Pressure ulcer of right buttock, stage 2: Secondary | ICD-10-CM | POA: Diagnosis not present

## 2021-09-28 DIAGNOSIS — Z635 Disruption of family by separation and divorce: Secondary | ICD-10-CM

## 2021-09-28 DIAGNOSIS — Z6834 Body mass index (BMI) 34.0-34.9, adult: Secondary | ICD-10-CM

## 2021-09-28 DIAGNOSIS — Z7901 Long term (current) use of anticoagulants: Secondary | ICD-10-CM

## 2021-09-28 DIAGNOSIS — E7800 Pure hypercholesterolemia, unspecified: Secondary | ICD-10-CM | POA: Diagnosis present

## 2021-09-28 DIAGNOSIS — J9601 Acute respiratory failure with hypoxia: Secondary | ICD-10-CM | POA: Diagnosis not present

## 2021-09-28 DIAGNOSIS — T4275XA Adverse effect of unspecified antiepileptic and sedative-hypnotic drugs, initial encounter: Secondary | ICD-10-CM | POA: Diagnosis not present

## 2021-09-28 DIAGNOSIS — N179 Acute kidney failure, unspecified: Secondary | ICD-10-CM

## 2021-09-28 DIAGNOSIS — D649 Anemia, unspecified: Secondary | ICD-10-CM

## 2021-09-28 DIAGNOSIS — I472 Ventricular tachycardia, unspecified: Secondary | ICD-10-CM | POA: Diagnosis not present

## 2021-09-28 DIAGNOSIS — R578 Other shock: Secondary | ICD-10-CM | POA: Diagnosis not present

## 2021-09-28 DIAGNOSIS — K219 Gastro-esophageal reflux disease without esophagitis: Secondary | ICD-10-CM | POA: Diagnosis present

## 2021-09-28 DIAGNOSIS — Z79899 Other long term (current) drug therapy: Secondary | ICD-10-CM

## 2021-09-28 HISTORY — DX: Gastro-esophageal reflux disease without esophagitis: K21.9

## 2021-09-28 HISTORY — PX: PERICARDIAL WINDOW: SHX2213

## 2021-09-28 HISTORY — DX: Dyspnea, unspecified: R06.00

## 2021-09-28 LAB — POCT I-STAT 7, (LYTES, BLD GAS, ICA,H+H)
Acid-base deficit: 3 mmol/L — ABNORMAL HIGH (ref 0.0–2.0)
Bicarbonate: 20.9 mmol/L (ref 20.0–28.0)
Calcium, Ion: 1.15 mmol/L (ref 1.15–1.40)
HCT: 27 % — ABNORMAL LOW (ref 39.0–52.0)
Hemoglobin: 9.2 g/dL — ABNORMAL LOW (ref 13.0–17.0)
O2 Saturation: 93 %
Potassium: 4.8 mmol/L (ref 3.5–5.1)
Sodium: 134 mmol/L — ABNORMAL LOW (ref 135–145)
TCO2: 22 mmol/L (ref 22–32)
pCO2 arterial: 33.8 mmHg (ref 32–48)
pH, Arterial: 7.398 (ref 7.35–7.45)
pO2, Arterial: 68 mmHg — ABNORMAL LOW (ref 83–108)

## 2021-09-28 LAB — ECHOCARDIOGRAM COMPLETE
AR max vel: 3.01 cm2
AV Area VTI: 3.36 cm2
AV Area mean vel: 2.68 cm2
AV Mean grad: 3 mmHg
AV Peak grad: 6 mmHg
Ao pk vel: 1.22 m/s
Area-P 1/2: 3.06 cm2
Calc EF: 48.3 %
Height: 67 in
P 1/2 time: 543 msec
S' Lateral: 3.8 cm
Single Plane A2C EF: 45.6 %
Single Plane A4C EF: 52.2 %
Weight: 3562.63 oz

## 2021-09-28 LAB — CBC
HCT: 31.8 % — ABNORMAL LOW (ref 39.0–52.0)
HCT: 28.8 % — ABNORMAL LOW (ref 39.0–52.0)
Hemoglobin: 9.9 g/dL — ABNORMAL LOW (ref 13.0–17.0)
Hemoglobin: 9.4 g/dL — ABNORMAL LOW (ref 13.0–17.0)
MCH: 29.2 pg (ref 26.0–34.0)
MCH: 29 pg (ref 26.0–34.0)
MCHC: 31.1 g/dL (ref 30.0–36.0)
MCHC: 32.6 g/dL (ref 30.0–36.0)
MCV: 93.8 fL (ref 80.0–100.0)
MCV: 88.9 fL (ref 80.0–100.0)
Platelets: 513 10*3/uL — ABNORMAL HIGH (ref 150–400)
Platelets: 619 10*3/uL — ABNORMAL HIGH (ref 150–400)
RBC: 3.39 MIL/uL — ABNORMAL LOW (ref 4.22–5.81)
RBC: 3.24 MIL/uL — ABNORMAL LOW (ref 4.22–5.81)
RDW: 16 % — ABNORMAL HIGH (ref 11.5–15.5)
RDW: 16 % — ABNORMAL HIGH (ref 11.5–15.5)
WBC: 17.2 10*3/uL — ABNORMAL HIGH (ref 4.0–10.5)
WBC: 15.4 10*3/uL — ABNORMAL HIGH (ref 4.0–10.5)
nRBC: 0.4 % — ABNORMAL HIGH (ref 0.0–0.2)
nRBC: 0.6 % — ABNORMAL HIGH (ref 0.0–0.2)

## 2021-09-28 LAB — LACTIC ACID, PLASMA
Lactic Acid, Venous: 2.9 mmol/L (ref 0.5–1.9)
Lactic Acid, Venous: 1.5 mmol/L (ref 0.5–1.9)
Lactic Acid, Venous: 1.4 mmol/L (ref 0.5–1.9)

## 2021-09-28 LAB — COMPREHENSIVE METABOLIC PANEL
ALT: 51 U/L — ABNORMAL HIGH (ref 0–44)
AST: 39 U/L (ref 15–41)
Albumin: 2.9 g/dL — ABNORMAL LOW (ref 3.5–5.0)
Alkaline Phosphatase: 117 U/L (ref 38–126)
Anion gap: 11 (ref 5–15)
BUN: 50 mg/dL — ABNORMAL HIGH (ref 8–23)
CO2: 22 mmol/L (ref 22–32)
Calcium: 8.6 mg/dL — ABNORMAL LOW (ref 8.9–10.3)
Chloride: 102 mmol/L (ref 98–111)
Creatinine, Ser: 2.35 mg/dL — ABNORMAL HIGH (ref 0.61–1.24)
GFR, Estimated: 31 mL/min — ABNORMAL LOW (ref >60)
Glucose, Bld: 147 mg/dL — ABNORMAL HIGH (ref 70–99)
Potassium: 4.2 mmol/L (ref 3.5–5.1)
Sodium: 135 mmol/L (ref 135–145)
Total Bilirubin: 1.3 mg/dL — ABNORMAL HIGH (ref 0.3–1.2)
Total Protein: 6.2 g/dL — ABNORMAL LOW (ref 6.5–8.1)

## 2021-09-28 LAB — GLUCOSE, CAPILLARY
Glucose-Capillary: 40 mg/dL — CL (ref 70–99)
Glucose-Capillary: 88 mg/dL (ref 70–99)
Glucose-Capillary: 54 mg/dL — ABNORMAL LOW (ref 70–99)
Glucose-Capillary: 64 mg/dL — ABNORMAL LOW (ref 70–99)
Glucose-Capillary: 126 mg/dL — ABNORMAL HIGH (ref 70–99)

## 2021-09-28 LAB — PROTIME-INR
INR: 2.2 — ABNORMAL HIGH (ref 0.8–1.2)
INR: 2 — ABNORMAL HIGH (ref 0.8–1.2)
Prothrombin Time: 23.9 seconds — ABNORMAL HIGH (ref 11.4–15.2)
Prothrombin Time: 22.8 seconds — ABNORMAL HIGH (ref 11.4–15.2)

## 2021-09-28 LAB — CBG MONITORING, ED
Glucose-Capillary: 60 mg/dL — ABNORMAL LOW (ref 70–99)
Glucose-Capillary: 129 mg/dL — ABNORMAL HIGH (ref 70–99)
Glucose-Capillary: 144 mg/dL — ABNORMAL HIGH (ref 70–99)
Glucose-Capillary: 95 mg/dL (ref 70–99)
Glucose-Capillary: 93 mg/dL (ref 70–99)
Glucose-Capillary: 74 mg/dL (ref 70–99)
Glucose-Capillary: 76 mg/dL (ref 70–99)

## 2021-09-28 LAB — APTT: aPTT: 37 seconds — ABNORMAL HIGH (ref 24–36)

## 2021-09-28 LAB — BRAIN NATRIURETIC PEPTIDE: B Natriuretic Peptide: 826.6 pg/mL — ABNORMAL HIGH (ref 0.0–100.0)

## 2021-09-28 LAB — TROPONIN I (HIGH SENSITIVITY)
Troponin I (High Sensitivity): 46 ng/L — ABNORMAL HIGH (ref <18)
Troponin I (High Sensitivity): 52 ng/L — ABNORMAL HIGH (ref <18)

## 2021-09-28 SURGERY — CREATION, PERICARDIAL WINDOW
Anesthesia: General | Laterality: Right

## 2021-09-28 MED ORDER — ASPIRIN 81 MG PO TBEC: 81.0000 mg | DELAYED_RELEASE_TABLET | Freq: Every day | ORAL | Status: DC

## 2021-09-28 MED ORDER — LIDOCAINE 2% (20 MG/ML) 5 ML SYRINGE
INTRAMUSCULAR | Status: AC
  Filled 2021-09-28: qty 5

## 2021-09-28 MED ORDER — LACTATED RINGERS IV SOLN: INTRAVENOUS | Status: DC | PRN

## 2021-09-28 MED ORDER — METOPROLOL TARTRATE 5 MG/5ML IV SOLN: 2.5000 mg | INTRAVENOUS | Status: DC | PRN

## 2021-09-28 MED ORDER — CHLORHEXIDINE GLUCONATE 0.12 % MT SOLN
OROMUCOSAL | Status: AC
  Filled 2021-09-28: qty 15

## 2021-09-28 MED ORDER — ETOMIDATE 2 MG/ML IV SOLN
INTRAVENOUS | Status: DC | PRN
  Administered 2021-09-28: 16 mg via INTRAVENOUS
  Administered 2021-09-28: 4 mg via INTRAVENOUS

## 2021-09-28 MED ORDER — SUCCINYLCHOLINE CHLORIDE 200 MG/10ML IV SOSY
PREFILLED_SYRINGE | INTRAVENOUS | Status: DC | PRN
  Administered 2021-09-28: 160 mg via INTRAVENOUS

## 2021-09-28 MED ORDER — LACTATED RINGERS IV SOLN: INTRAVENOUS | Status: DC

## 2021-09-28 MED ORDER — CHLORHEXIDINE GLUCONATE 0.12 % MT SOLN
OROMUCOSAL | Status: AC
  Administered 2021-09-28: 15 mL via OROMUCOSAL
  Filled 2021-09-28: qty 15

## 2021-09-28 MED ORDER — PANTOPRAZOLE SODIUM 40 MG PO TBEC: 40.0000 mg | DELAYED_RELEASE_TABLET | Freq: Every day | ORAL | Status: DC

## 2021-09-28 MED ORDER — HEPARIN SODIUM (PORCINE) 5000 UNIT/ML IJ SOLN
5000.0000 [IU] | Freq: Three times a day (TID) | INTRAMUSCULAR | Status: DC
  Administered 2021-09-28 – 2021-09-29 (×2): 5000 [IU] via SUBCUTANEOUS
  Filled 2021-09-28 (×2): qty 1

## 2021-09-28 MED ORDER — DEXTROSE 50 % IV SOLN: 0.0000 mL | INTRAVENOUS | Status: DC | PRN

## 2021-09-28 MED ORDER — ACETAMINOPHEN 325 MG PO TABS: 650.0000 mg | ORAL_TABLET | ORAL | Status: DC | PRN

## 2021-09-28 MED ORDER — MIDAZOLAM HCL 2 MG/2ML IJ SOLN
INTRAMUSCULAR | Status: AC
  Filled 2021-09-28: qty 2

## 2021-09-28 MED ORDER — MAGNESIUM SULFATE 4 GM/100ML IV SOLN: 4.0000 g | Freq: Once | INTRAVENOUS | Status: DC

## 2021-09-28 MED ORDER — AMIODARONE HCL 200 MG PO TABS
200.0000 mg | ORAL_TABLET | Freq: Every day | ORAL | Status: DC
  Administered 2021-09-28 – 2021-09-30 (×2): 200 mg via ORAL
  Filled 2021-09-28 (×2): qty 1

## 2021-09-28 MED ORDER — BISACODYL 10 MG RE SUPP
10.0000 mg | Freq: Every day | RECTAL | Status: DC
  Administered 2021-10-01 – 2021-10-04 (×3): 10 mg via RECTAL
  Filled 2021-09-28 (×3): qty 1

## 2021-09-28 MED ORDER — ATORVASTATIN CALCIUM 80 MG PO TABS
80.0000 mg | ORAL_TABLET | Freq: Every day | ORAL | Status: DC
  Administered 2021-09-28 – 2021-09-30 (×3): 80 mg via ORAL
  Filled 2021-09-28 (×2): qty 1

## 2021-09-28 MED ORDER — FUROSEMIDE 10 MG/ML IJ SOLN
20.0000 mg | Freq: Once | INTRAMUSCULAR | Status: AC
  Administered 2021-09-28: 20 mg via INTRAVENOUS
  Filled 2021-09-28: qty 2

## 2021-09-28 MED ORDER — ACETAMINOPHEN 650 MG RE SUPP: 650.0000 mg | Freq: Once | RECTAL | Status: DC

## 2021-09-28 MED ORDER — DEXTROSE 50 % IV SOLN
INTRAVENOUS | Status: AC
  Administered 2021-09-28: 25 mL via INTRAVENOUS
  Filled 2021-09-28: qty 50

## 2021-09-28 MED ORDER — ONDANSETRON HCL 4 MG/2ML IJ SOLN
INTRAMUSCULAR | Status: DC | PRN
  Administered 2021-09-28: 4 mg via INTRAVENOUS

## 2021-09-28 MED ORDER — DOCUSATE SODIUM 100 MG PO CAPS
200.0000 mg | ORAL_CAPSULE | Freq: Every day | ORAL | Status: DC
  Filled 2021-09-28: qty 2

## 2021-09-28 MED ORDER — PHENYLEPHRINE 80 MCG/ML (10ML) SYRINGE FOR IV PUSH (FOR BLOOD PRESSURE SUPPORT): PREFILLED_SYRINGE | INTRAVENOUS | Status: DC | PRN

## 2021-09-28 MED ORDER — DEXTROSE 50 % IV SOLN
1.0000 | Freq: Once | INTRAVENOUS | Status: AC
  Administered 2021-09-28: 50 mL via INTRAVENOUS
  Filled 2021-09-28: qty 50

## 2021-09-28 MED ORDER — PROPOFOL 10 MG/ML IV BOLUS
INTRAVENOUS | Status: AC
  Filled 2021-09-28: qty 20

## 2021-09-28 MED ORDER — SODIUM CHLORIDE 0.9 % IV SOLN: 250.0000 mL | INTRAVENOUS | Status: DC

## 2021-09-28 MED ORDER — ASPIRIN 81 MG PO CHEW
324.0000 mg | CHEWABLE_TABLET | Freq: Every day | ORAL | Status: DC
  Administered 2021-09-29: 324 mg
  Filled 2021-09-28: qty 4

## 2021-09-28 MED ORDER — SODIUM CHLORIDE 0.9 % IV SOLN: INTRAVENOUS | Status: DC

## 2021-09-28 MED ORDER — ASPIRIN 325 MG PO TBEC: 325.0000 mg | DELAYED_RELEASE_TABLET | Freq: Every day | ORAL | Status: DC

## 2021-09-28 MED ORDER — LACTATED RINGERS IV SOLN: 500.0000 mL | Freq: Once | INTRAVENOUS | Status: DC | PRN

## 2021-09-28 MED ORDER — METOPROLOL TARTRATE 12.5 MG HALF TABLET: 12.5000 mg | ORAL_TABLET | Freq: Two times a day (BID) | ORAL | Status: DC

## 2021-09-28 MED ORDER — ACETAMINOPHEN 160 MG/5ML PO SOLN: 1000.0000 mg | Freq: Four times a day (QID) | ORAL | Status: DC

## 2021-09-28 MED ORDER — SODIUM CHLORIDE 0.45 % IV SOLN: INTRAVENOUS | Status: DC | PRN

## 2021-09-28 MED ORDER — CHLORHEXIDINE GLUCONATE 0.12 % MT SOLN: 15.0000 mL | OROMUCOSAL | Status: DC

## 2021-09-28 MED ORDER — BUPIVACAINE LIPOSOME 1.3 % IJ SUSP
INTRAMUSCULAR | Status: AC
  Filled 2021-09-28: qty 20

## 2021-09-28 MED ORDER — VANCOMYCIN HCL IN DEXTROSE 1-5 GM/200ML-% IV SOLN
1000.0000 mg | Freq: Once | INTRAVENOUS | Status: AC
Start: 2021-09-28 — End: 2021-09-28
  Administered 2021-09-28: 1000 mg via INTRAVENOUS
  Filled 2021-09-28: qty 200

## 2021-09-28 MED ORDER — MORPHINE SULFATE (PF) 2 MG/ML IV SOLN: 1.0000 mg | INTRAVENOUS | Status: DC | PRN

## 2021-09-28 MED ORDER — ALBUMIN HUMAN 5 % IV SOLN: 250.0000 mL | INTRAVENOUS | Status: DC | PRN

## 2021-09-28 MED ORDER — DEXTROSE 10 % IV SOLN: INTRAVENOUS | Status: DC

## 2021-09-28 MED ORDER — CHLORHEXIDINE GLUCONATE 0.12 % MT SOLN: 15.0000 mL | Freq: Once | OROMUCOSAL | Status: AC

## 2021-09-28 MED ORDER — ACETAMINOPHEN 500 MG PO TABS: 1000.0000 mg | ORAL_TABLET | Freq: Four times a day (QID) | ORAL | Status: DC

## 2021-09-28 MED ORDER — SODIUM CHLORIDE 0.9% FLUSH
3.0000 mL | INTRAVENOUS | Status: DC | PRN
Start: 2021-09-29 — End: 2021-10-07

## 2021-09-28 MED ORDER — 0.9 % SODIUM CHLORIDE (POUR BTL) OPTIME
TOPICAL | Status: DC | PRN
  Administered 2021-09-28: 3000 mL

## 2021-09-28 MED ORDER — FAMOTIDINE IN NACL 20-0.9 MG/50ML-% IV SOLN: 20.0000 mg | Freq: Two times a day (BID) | INTRAVENOUS | Status: DC

## 2021-09-28 MED ORDER — FENTANYL CITRATE (PF) 250 MCG/5ML IJ SOLN
INTRAMUSCULAR | Status: AC
  Filled 2021-09-28: qty 5

## 2021-09-28 MED ORDER — LIDOCAINE 2% (20 MG/ML) 5 ML SYRINGE
INTRAMUSCULAR | Status: DC | PRN
  Administered 2021-09-28: 80 mg via INTRAVENOUS

## 2021-09-28 MED ORDER — BUPIVACAINE LIPOSOME 1.3 % IJ SUSP
INTRAMUSCULAR | Status: DC | PRN
  Administered 2021-09-28: 50 mL

## 2021-09-28 MED ORDER — NOREPINEPHRINE 4 MG/250ML-% IV SOLN: 0.0000 ug/min | INTRAVENOUS | Status: DC

## 2021-09-28 MED ORDER — ONDANSETRON HCL 4 MG/2ML IJ SOLN
4.0000 mg | Freq: Four times a day (QID) | INTRAMUSCULAR | Status: DC | PRN
  Administered 2021-10-28 – 2021-11-12 (×4): 4 mg via INTRAVENOUS
  Filled 2021-09-28 (×5): qty 2

## 2021-09-28 MED ORDER — INSULIN REGULAR(HUMAN) IN NACL 100-0.9 UT/100ML-% IV SOLN: INTRAVENOUS | Status: DC

## 2021-09-28 MED ORDER — FENTANYL CITRATE (PF) 250 MCG/5ML IJ SOLN
INTRAMUSCULAR | Status: DC | PRN
  Administered 2021-09-28: 50 ug via INTRAVENOUS

## 2021-09-28 MED ORDER — TRAMADOL HCL 50 MG PO TABS
50.0000 mg | ORAL_TABLET | ORAL | Status: DC | PRN
  Administered 2021-09-29: 50 mg via ORAL
  Filled 2021-09-28: qty 1

## 2021-09-28 MED ORDER — EPHEDRINE SULFATE-NACL 50-0.9 MG/10ML-% IV SOSY
PREFILLED_SYRINGE | INTRAVENOUS | Status: DC | PRN
  Administered 2021-09-28 (×7): 5 mg via INTRAVENOUS

## 2021-09-28 MED ORDER — SODIUM CHLORIDE 0.9% FLUSH
3.0000 mL | Freq: Two times a day (BID) | INTRAVENOUS | Status: DC
  Administered 2021-09-29 – 2021-10-06 (×10): 3 mL via INTRAVENOUS

## 2021-09-28 MED ORDER — BISACODYL 5 MG PO TBEC
10.0000 mg | DELAYED_RELEASE_TABLET | Freq: Every day | ORAL | Status: DC
  Filled 2021-09-28: qty 2

## 2021-09-28 MED ORDER — SUGAMMADEX SODIUM 200 MG/2ML IV SOLN
INTRAVENOUS | Status: DC | PRN
  Administered 2021-09-28: 200 mg via INTRAVENOUS

## 2021-09-28 MED ORDER — PHENYLEPHRINE HCL-NACL 20-0.9 MG/250ML-% IV SOLN
INTRAVENOUS | Status: DC | PRN
  Administered 2021-09-28: 40 ug/min via INTRAVENOUS

## 2021-09-28 MED ORDER — NITROGLYCERIN 0.4 MG SL SUBL: 0.4000 mg | SUBLINGUAL_TABLET | SUBLINGUAL | Status: DC | PRN

## 2021-09-28 MED ORDER — POTASSIUM CHLORIDE 10 MEQ/50ML IV SOLN: 10.0000 meq | INTRAVENOUS | Status: DC

## 2021-09-28 MED ORDER — DEXAMETHASONE SODIUM PHOSPHATE 10 MG/ML IJ SOLN
INTRAMUSCULAR | Status: AC
  Filled 2021-09-28: qty 1

## 2021-09-28 MED ORDER — BUPIVACAINE HCL (PF) 0.5 % IJ SOLN
INTRAMUSCULAR | Status: AC
  Filled 2021-09-28: qty 30

## 2021-09-28 MED ORDER — OXYCODONE HCL 5 MG PO TABS
5.0000 mg | ORAL_TABLET | ORAL | Status: DC | PRN
  Administered 2021-09-28 – 2021-09-29 (×3): 10 mg via ORAL
  Filled 2021-09-28 (×4): qty 2

## 2021-09-28 MED ORDER — ACETAMINOPHEN 160 MG/5ML PO SOLN: 650.0000 mg | Freq: Once | ORAL | Status: DC

## 2021-09-28 MED ORDER — DEXMEDETOMIDINE HCL IN NACL 400 MCG/100ML IV SOLN: 0.0000 ug/kg/h | INTRAVENOUS | Status: DC

## 2021-09-28 MED ORDER — ROCURONIUM BROMIDE 10 MG/ML (PF) SYRINGE
PREFILLED_SYRINGE | INTRAVENOUS | Status: AC
  Filled 2021-09-28: qty 10

## 2021-09-28 MED ORDER — ATROPINE SULFATE 1 MG/10ML IJ SOSY
0.5000 mg | PREFILLED_SYRINGE | Freq: Once | INTRAMUSCULAR | Status: AC
  Administered 2021-09-28: 0.5 mg via INTRAVENOUS
  Filled 2021-09-28: qty 10

## 2021-09-28 MED ORDER — CEFAZOLIN SODIUM-DEXTROSE 2-4 GM/100ML-% IV SOLN
2.0000 g | Freq: Three times a day (TID) | INTRAVENOUS | Status: DC
  Administered 2021-09-28 – 2021-09-29 (×3): 2 g via INTRAVENOUS
  Filled 2021-09-28 (×3): qty 100

## 2021-09-28 MED ORDER — MIDAZOLAM HCL 2 MG/2ML IJ SOLN: 2.0000 mg | INTRAMUSCULAR | Status: DC | PRN

## 2021-09-28 MED ORDER — ONDANSETRON HCL 4 MG/2ML IJ SOLN
INTRAMUSCULAR | Status: AC
  Filled 2021-09-28: qty 2

## 2021-09-28 MED ORDER — ONDANSETRON HCL 4 MG/2ML IJ SOLN: 4.0000 mg | Freq: Four times a day (QID) | INTRAMUSCULAR | Status: DC | PRN

## 2021-09-28 MED ORDER — CEFAZOLIN SODIUM-DEXTROSE 2-3 GM-%(50ML) IV SOLR
INTRAVENOUS | Status: DC | PRN
  Administered 2021-09-28: 2 g via INTRAVENOUS

## 2021-09-28 MED ORDER — STERILE WATER FOR IRRIGATION IR SOLN
Status: DC | PRN
  Administered 2021-09-28: 1000 mL

## 2021-09-28 MED ORDER — METOPROLOL TARTRATE 25 MG/10 ML ORAL SUSPENSION: 12.5000 mg | Freq: Two times a day (BID) | ORAL | Status: DC

## 2021-09-28 MED ORDER — CHLORHEXIDINE GLUCONATE CLOTH 2 % EX PADS
6.0000 | MEDICATED_PAD | Freq: Every day | CUTANEOUS | Status: DC
  Administered 2021-09-28 – 2021-10-06 (×12): 6 via TOPICAL

## 2021-09-28 MED ORDER — ORAL CARE MOUTH RINSE: 15.0000 mL | Freq: Once | OROMUCOSAL | Status: AC

## 2021-09-28 SURGICAL SUPPLY — 37 items
ADH SKN CLS APL DERMABOND .7 (GAUZE/BANDAGES/DRESSINGS) ×1
APL PRP STRL LF DISP 70% ISPRP (MISCELLANEOUS) ×1
BLADE SURG 11 STRL SS (BLADE) IMPLANT
CHLORAPREP W/TINT 26 (MISCELLANEOUS) IMPLANT
CNTNR URN SCR LID CUP LEK RST (MISCELLANEOUS) IMPLANT
CONT SPEC 4OZ STRL OR WHT (MISCELLANEOUS) ×1
DEFOGGER SCOPE WARMER CLEARIFY (MISCELLANEOUS) IMPLANT
DERMABOND ADVANCED (GAUZE/BANDAGES/DRESSINGS) ×1
DERMABOND ADVANCED .7 DNX12 (GAUZE/BANDAGES/DRESSINGS) IMPLANT
DRAIN CHANNEL 19F RND (DRAIN) IMPLANT
DRAIN CONNECTOR BLAKE 1:1 (MISCELLANEOUS) IMPLANT
ELECT REM PT RETURN 9FT ADLT (ELECTROSURGICAL) ×1
ELECT SOLID GEL RDN PRO-PADZ (MISCELLANEOUS) ×1
ELECTRODE REM PT RTRN 9FT ADLT (ELECTROSURGICAL) IMPLANT
ELECTRODE SOLI GEL RDN PROPADZ (MISCELLANEOUS) IMPLANT
GAUZE SPONGE 4X4 12PLY STRL (GAUZE/BANDAGES/DRESSINGS) IMPLANT
KIT BASIN OR (CUSTOM PROCEDURE TRAY) IMPLANT
L-HOOK LAP DISP 36CM (ELECTROSURGICAL) ×1
LHOOK LAP DISP 36CM (ELECTROSURGICAL) IMPLANT
NDL BEVEL SYR 25X1 (NEEDLE) IMPLANT
NEEDLE 22X1 1/2 (OR ONLY) (NEEDLE) IMPLANT
NEEDLE BEVEL SYR 25X1 (NEEDLE) ×1 IMPLANT
NS IRRIG 1000ML POUR BTL (IV SOLUTION) IMPLANT
PACK CHEST (CUSTOM PROCEDURE TRAY) IMPLANT
PACK UNIVERSAL I (CUSTOM PROCEDURE TRAY) IMPLANT
SUT SILK  1 MH (SUTURE) ×1
SUT SILK 1 MH (SUTURE) IMPLANT
SUT VIC AB 2-0 UR6 27 (SUTURE) IMPLANT
SUT VIC AB 3-0 SH 27 (SUTURE) ×1
SUT VIC AB 3-0 SH 27X BRD (SUTURE) IMPLANT
SYR 20ML LL LF (SYRINGE) IMPLANT
SYSTEM SAHARA CHEST DRAIN ATS (WOUND CARE) IMPLANT
TAPE PAPER 1/2X10 TAN MEDIPORE (MISCELLANEOUS) IMPLANT
TOWEL GREEN STERILE FF (TOWEL DISPOSABLE) IMPLANT
TROCAR XCEL NON-BLD 5MMX100MML (ENDOMECHANICALS) IMPLANT
TUBING LAP HI FLOW INSUFFLATIO (TUBING) IMPLANT
WATER STERILE IRR 1000ML POUR (IV SOLUTION) IMPLANT

## 2021-09-28 NOTE — ED Provider Notes (Signed)
[facility] Emergency Department Provider Note MRN:  [id number]  Arrival date & time: 09/28/21     Chief Complaint   Shortness of breath History of Present Illness   [name] is a 61 y.o. year-old male with a history of CAD status post 5 vessel CABG presenting to the ED with chief complaint of shortness of breath.  Patient has been experiencing shortness of breath and dyspnea on exertion for the past several days.  He underwent CABG on the third of this month.  He has not been getting around very well, has not been taking his new diabetes medicines.  Has been taking insulin.  Felt more severely short of breath this evening and called EMS.  Was found to be diaphoretic and bradycardic.  Blood glucose found to be in the 40s, given D10 in route.  Denies having any chest pain during this.  Review of Systems  A thorough review of systems was obtained and all systems are negative except as noted in the HPI and PMH.   Patient's Health History    Past Medical History:  Diagnosis Date   AKI (acute kidney injury) ([place])    pt unaware of this   Anginal pain ([place])    CAD (coronary artery disease)    a. 03/2015 NSTEMI: LHC with severe 3V CAD  (70% mid RCA, 95% OM1, 90% distal LCx, 90% OM3, 80% prox LAD and 90% ost D1) s/p DES to mLAD w/ Reyes dissction Rx with DES, staged ost Ramus PCI/DES and dLCx s/p PCI/DES    Chest pain 12/24/2020   Diabetes mellitus type 2 in obese [facility])    Diverticulosis    Dyspnea on exertion 03/16/2015   Dyspnea on exertion   Family history of adverse reaction to anesthesia    patient father- pt states after anesthesia his father "developed dementia"   Hypercholesteremia    Hypertension associated with diabetes ([place]) 03/16/2015   hypertension   NSTEMI (non-ST elevated myocardial infarction) ([place]) 03/17/2015   Obesity    Stroke ([place]) 2022   pt states he had a "mini stroke" during cardiac catheterization   Tobacco abuse     Past Surgical  History:  Procedure Laterality Date   CARDIAC CATHETERIZATION N/A 03/19/2015   Procedure: Left Heart Cath and Coronary Angiography;  Surgeon: Lorretta Harp, MD;  Location: [place] [facility];  Service: Cardiovascular;  Laterality: N/A;   CARDIAC CATHETERIZATION N/A 03/20/2015   Procedure: Coronary Stent Intervention;  Surgeon: Lorretta Harp, MD;  Location: [place] [facility];  Service: Cardiovascular;  Laterality: N/A;   CARDIAC CATHETERIZATION N/A 03/22/2015   Procedure: Coronary Stent Intervention;  Surgeon: Lorretta Harp, MD;  Location: [place] [facility];  Service: Cardiovascular;  Laterality: N/A;   CORONARY ARTERY BYPASS GRAFT N/A 09/12/2021   Procedure: CORONARY ARTERY BYPASS GRAFTING (CABG) X 5 USING LEFT INTERNAL MAMMARY ARTERY AND ENDOSCOPICALLY HARVESTED RIGHT GREATER SAPHENOUS VEIN.;  Surgeon: [name], MD;  Location: [place];  Service: Open Heart Surgery;  Laterality: N/A;   LEFT HEART CATH AND CORONARY ANGIOGRAPHY N/A 12/25/2020   Procedure: LEFT HEART CATH AND CORONARY ANGIOGRAPHY;  Surgeon: Troy Sine, MD;  Location: [place] [facility];  Service: Cardiovascular;  Laterality: N/A;   LEFT HEART CATH AND CORONARY ANGIOGRAPHY N/A 12/26/2020   Procedure: LEFT HEART CATH AND CORONARY ANGIOGRAPHY;  Surgeon: Troy Sine, MD;  Location: [place] [facility];  Service: Cardiovascular;  Laterality: N/A;   TEE WITHOUT CARDIOVERSION  N/A 09/12/2021   Procedure: TRANSESOPHAGEAL ECHOCARDIOGRAM (TEE);  Surgeon: [name], MD;  Location: [place];  Service: Open Heart Surgery;  Laterality: N/A;   TESTICLE SURGERY  1970   pt states testicle was ascended and had to be "pulled down"   TONSILLECTOMY     as a child    No family history on file.  Social History   Socioeconomic History   Marital status: Legally Separated    Spouse name: Not on file   Number of children: 0   Years of education: Not on file   Highest education level: Not on file  Occupational History   Not on  file  Tobacco Use   Smoking status: Former    Packs/day: 0.50    Types: Cigarettes    Quit date: 2017    Years since quitting: 6.6   Smokeless tobacco: Never  Vaping Use   Vaping Use: Some days  Substance and Sexual Activity   Alcohol use: Yes    Comment: very occasional, maybe a beer or mixed drink once every few months   Drug use: Not Currently   Sexual activity: Not on file  Other Topics Concern   Not on file  Social History Narrative   Not on file   Social Determinants of Health   Financial Resource Strain: Not on file  Food Insecurity: Not on file  Transportation Needs: Not on file  Physical Activity: Not on file  Stress: Not on file  Social Connections: Not on file  Intimate Partner Violence: Not on file     Physical Exam   Vitals:   09/28/21 0600 09/28/21 0630  BP: 106/74 113/79  Pulse: (!) 55 (!) 50  Resp: (!) 22 (!) 22  Temp:    SpO2: 98% 99%    CONSTITUTIONAL: Ill-appearing, NAD, pale, diaphoretic NEURO/PSYCH:  Alert and oriented x 3, no focal deficits EYES:  eyes equal and reactive ENT/NECK:  no LAD, no JVD CARDIO: Bradycardic rate, well-perfused, normal S1 and S2 PULM:  CTAB no wheezing or rhonchi GI/GU:  non-distended, non-tender MSK/SPINE:  No gross deformities, no edema SKIN:  no rash, atraumatic   *Additional and/or pertinent findings included in MDM below  Diagnostic and Interventional Summary    EKG Interpretation  Date/Time:  Saturday September 28 2021 05:18:08 EDT Ventricular Rate:  57 PR Interval:  153 QRS Duration: 140 QT Interval:  553 QTC Calculation: 539 R Axis:   80 Text Interpretation: Sinus rhythm Probable left atrial enlargement Nonspecific intraventricular conduction delay Anteroseptal infarct, age indeterminate Lateral leads are also involved Confirmed by Gerlene Fee [phone]) on 09/28/2021 5:42:29 AM       Labs Reviewed  CBC - Abnormal; Notable for the following components:      Result Value   WBC 17.2 (*)    RBC  3.39 (*)    Hemoglobin 9.9 (*)    HCT 31.8 (*)    RDW 16.0 (*)    Platelets 513 (*)    nRBC 0.4 (*)    All other components within normal limits  COMPREHENSIVE METABOLIC PANEL - Abnormal; Notable for the following components:   Glucose, Bld 147 (*)    BUN 50 (*)    Creatinine, Ser 2.35 (*)    Calcium 8.6 (*)    Total Protein 6.2 (*)    Albumin 2.9 (*)    ALT 51 (*)    Total Bilirubin 1.3 (*)    GFR, Estimated 31 (*)    All other components within  normal limits  BRAIN NATRIURETIC PEPTIDE - Abnormal; Notable for the following components:   B Natriuretic Peptide 826.6 (*)    All other components within normal limits  LACTIC ACID, PLASMA - Abnormal; Notable for the following components:   Lactic Acid, Venous 2.9 (*)    All other components within normal limits  PROTIME-INR - Abnormal; Notable for the following components:   Prothrombin Time 23.9 (*)    INR 2.2 (*)    All other components within normal limits  CBG MONITORING, ED - Abnormal; Notable for the following components:   Glucose-Capillary 60 (*)    All other components within normal limits  CBG MONITORING, ED - Abnormal; Notable for the following components:   Glucose-Capillary 129 (*)    All other components within normal limits  CBG MONITORING, ED - Abnormal; Notable for the following components:   Glucose-Capillary 144 (*)    All other components within normal limits  TROPONIN I (HIGH SENSITIVITY) - Abnormal; Notable for the following components:   Troponin I (High Sensitivity) 46 (*)    All other components within normal limits  CBG MONITORING, ED  TROPONIN I (HIGH SENSITIVITY)    DG Chest Port 1 View  Final Result      Medications  dextrose 50 % solution 50 mL (50 mLs Intravenous Given 09/28/21 0543)  atropine 1 MG/10ML injection 0.5 mg (0.5 mg Intravenous Given 09/28/21 0544)  furosemide (LASIX) injection 20 mg (20 mg Intravenous Given 09/28/21 0762)     Procedures  /  Critical Care .Critical  Care  Performed by: Maudie Flakes, MD Authorized by: Maudie Flakes, MD   Critical care provider statement:    Critical care time (minutes):  80   Critical care was necessary to treat or prevent imminent or life-threatening deterioration of the following conditions: Symptomatic bradycardia.   Critical care was time spent personally by me on the following activities:  Development of treatment plan with patient or surrogate, discussions with consultants, evaluation of patient's response to treatment, examination of patient, ordering and review of laboratory studies, ordering and review of radiographic studies, ordering and performing treatments and interventions, pulse oximetry, re-evaluation of patient's condition and review of old charts   ED Course and Medical Decision Making  Initial Impression and Ddx Gradual shortness of breath acutely worsening this evening, very recent CABG.  Diaphoretic, pale, found to be hypoglycemic with EMS.  Never had any chest pain during this.  Feeling much better after D10, color is back, no longer diaphoretic.  Feels basically back to normal, feels very relieved that he can breathe again.  EKG demonstrating significant changes, namely bradycardia as well as subtle ST elevations inferiorly.  Acute coronary syndrome felt to be less likely given the hypoglycemia and lack of chest pain.  Case discussed with Dr. Burt Knack of cardiology who is in agreement, no STEMI activation at this time, will continue to manage medically and await laboratory assessment.  Past medical/surgical history that increases complexity of ED encounter: CAD status post CABG  Interpretation of Diagnostics I personally reviewed the EKG and my interpretation is as follows: Sinus bradycardia  Labs reveal AKI, BNP elevation, otherwise no significant blood count or electrolyte disturbance  Patient Reassessment and Ultimate Disposition/Management    Patient with continued bradycardia and occasional  soft blood pressure/hypotension despite correction of glucose, raises concern for symptomatic bradycardia.  Decision was made to trial atropine, which resulted in heart rate in the 50s and improved blood pressure.  Patient with worsening  shortness of breath since arrival.  Now requiring a few liters nasal cannula to maintain saturations.  Patient was given 250-500cc fluid bolus in error and so pulmonary edema is suspected.  Will inform CT surgery, anticipating admission to cardiology.  Patient management required discussion with the following services or consulting groups:  Cardiology  Complexity of Problems Addressed Acute illness or injury that poses threat of life of bodily function  Additional Data Reviewed and Analyzed Further history obtained from: Recent discharge summary  Additional Factors Impacting ED Encounter Risk Consideration of hospitalization  [name], [place] [email or web address]  Final Clinical Impressions(s) / ED Diagnoses     ICD-10-CM   1. Symptomatic bradycardia  R00.1       ED Discharge Orders     None        Discharge Instructions Discussed with and Provided to Patient:   Discharge Instructions   None      Maudie Flakes, MD 09/28/21 [phone]

## 2021-09-28 NOTE — ED Notes (Signed)
No changes. Echo and admitting MD at [facility]. VSS.

## 2021-09-28 NOTE — Consult Note (Addendum)
Cardiology Consultation:   Patient ID: [name]; [id number]; Mar 22, 1960   Admit date: 09/28/2021 Date of Consult: 09/28/2021  Primary Care Provider: [name], MD Primary Cardiologist: Dr. Gwyndolyn Kaufman, MD   Patient Profile:   DOZIER BERKOVICH is a 61 y.o. male with a hx of DM2, CAD s/p recent CABG x5, CKD stage II, and CVA who is being seen today for the evaluation of CHF at the request of Dr. Doren Custard.  History of Present Illness:   Mr. Wearing is a 61yo M with a hx as stated above who recently underwent CABG x5 09/12/21. He had no immediate post op complications however began having intermittent atrial fibrillation with rates in the 120's prior to discharge. His carvedilol was increased to 56m BID,  Plavix was stopped and Eliqius was initiated. By day of discharge, his rates were stable in the 80's. He had issues with elevated creatinine with a peak at 1.61 and a baseline at 1.4. His Lasix was stopped and was discharged 09/16/21 to his home. He reports that he initially felt good after being home with improved symptoms however began having issues with lethargy and DOE which has been worsening over the last several weeks. He reports compliance with medications. He states that he has been very sedentary since discharge, only able to lay in his bed and go to the restroom. He can only walk short distances  before becoming SOB. He has no chest pain, LE edema, palpitations. Is having intermittent chills, no fevers, and orthopnea.   Early this AM, he woke with acute onset of SOB and diaphoresis. He called EMS for transport to the ED for further evaluation. On their arrival he was found to be bradycardiac with HR's in the 40's. Blood glucose was also in the 40's that was treated with atropine. On ED arrival, labs reveal AKI with creatinine elevated to 2.35. Lactic acid at 2.9. Hb at 9.9 with WBC at 17.2. CXR with persistent retrocardiac left base opacity, felt likely to be atelectasis with small  pleural effusions.   Cardiology has been asked to follow with him.   Past Medical History:  Diagnosis Date   AKI ([place] kidney [place]) ([place]    pt unaware of this   [place]inal [place]    CAD (coronary artery disease)    a. 03/2015 NSTEMI: LHC with severe 3V CAD  (70% mid RCA, 95% OM1, 90% distal LCx, 90% OM3, 80% prox LAD and 90% ost D1) s/p DES to mLAD w/ small dissction Rx with DES, staged ost Ramus PCI/DES and dLCx s/p PCI/DES    Chest pain 12/24/2020   Diabetes mellitus type 2 in obese ([facility]    Diverticulosis    Dyspnea on exertion 03/16/2015   Dyspnea on exertion   Family history of adverse reaction to anesthesia    patient father- pt states after anesthesia his father "developed dementia"   Hypercholesteremia    Hypertension associated with diabetes ([place] 03/16/2015   hypertension   [place]STEMI (non-ST elevated myocardial infarction) ([place] 03/17/2015   Obesity    Stroke [place]   pt states he had a "mini stroke" during cardiac catheterization   Tobacco abuse     Past Surgical History:  Procedure Laterality Date   CARDIAC CATHETERIZATION N/A 03/19/2015   Procedure: Left Heart Cath and Coronary Angiography;  Surgeon: JLorretta Harp MD;  [facility];  Service: Cardiovascular;  Laterality: N/A;   CARDIAC CATHETERIZATION N/A 03/20/2015   Procedure: Coronary Stent  Intervention;  Surgeon: Lorretta Harp, MD;  Location: [place] [facility];  Service: Cardiovascular;  Laterality: N/A;   CARDIAC CATHETERIZATION N/A 03/22/2015   Procedure: Coronary Stent Intervention;  Surgeon: Lorretta Harp, MD;  Location: [place] [facility];  Service: Cardiovascular;  Laterality: N/A;   CORONARY ARTERY BYPASS GRAFT N/A 09/12/2021   Procedure: CORONARY ARTERY BYPASS GRAFTING (CABG) X 5 USING LEFT INTERNAL MAMMARY ARTERY AND ENDOSCOPICALLY HARVESTED RIGHT GREATER SAPHENOUS VEIN.;  Surgeon: [name], MD;  Location: [place];  Service: Open Heart Surgery;  Laterality: N/A;   LEFT  HEART CATH AND CORONARY ANGIOGRAPHY N/A 12/25/2020   Procedure: LEFT HEART CATH AND CORONARY ANGIOGRAPHY;  Surgeon: Troy Sine, MD;  Location: [place]  CV LAB;  Service: Cardiovascular;  Laterality: N/A;   LEFT HEART CATH AND CORONARY ANGIOGRAPHY N/A 12/26/2020   Procedure: LEFT HEART CATH AND CORONARY ANGIOGRAPHY;  Surgeon: Troy Sine, MD;  Location: [place] [facility];  Service: Cardiovascular;  Laterality: N/A;   TEE WITHOUT CARDIOVERSION N/A 09/12/2021   Procedure: TRANSESOPHAGEAL ECHOCARDIOGRAM (TEE);  Surgeon: [name], MD;  Location: [place];  Service: Open Heart Surgery;  Laterality: N/A;   TESTICLE SURGERY  1970   pt states testicle was ascended and had to be "pulled down"   TONSILLECTOMY     as a child     Prior to Admission medications   Medication Sig Start Date End Date Taking? Authorizing Provider  amiodarone (PACERONE) 200 MG tablet Take 1 tablet by mouth  twice daily for 2 days then take 1 tablet daily thereafter 09/20/21   Thomasenia Sales, Pricilla Larsson, PA-C  amLODipine (NORVASC) 10 MG tablet Take 1 tablet (10 mg total) by mouth daily. 08/21/21   Charlott Rakes, MD  apixaban (ELIQUIS) 5 MG TABS tablet Take 1 tablet (5 mg total) by mouth 2 (two) times daily. 09/20/21   Stehler, Pricilla Larsson, PA-C  aspirin EC 81 MG tablet Take 1 tablet (81 mg total) by mouth daily. Swallow whole. 09/20/21   Stehler, Pricilla Larsson, PA-C  atorvastatin (LIPITOR) 80 MG tablet Take 1 tablet (80 mg total) by mouth daily. 09/06/21   Charlott Rakes, MD  blood glucose meter kit and supplies Dispense based on patient and insurance preference. Use up to four times daily as directed. (FOR ICD-10 E10.9, E11.9). 01/01/21   Geradine Girt, DO  Blood Glucose Monitoring Suppl (TRUE METRIX METER) w/Device KIT Use to check blood sugar three times daily. 06/10/21   Charlott Rakes, MD  carvedilol (COREG) 25 MG tablet Take 1 tablet (25 mg total) by mouth 2 (two) times daily with a meal. 09/20/21   Stehler, Pricilla Larsson, PA-C   Continuous Blood Gluc Sensor (FREESTYLE LIBRE 2 SENSOR) MISC Utilize as directed q 14 days to monitor blood sugar. 05/10/21   Charlott Rakes, MD  Dulaglutide (TRULICITY) 1.5 ZJ/6.9CV SOPN Inject 1.5 mg into the skin once a week. 06/10/21   Charlott Rakes, MD  ferrous sulfate 325 (65 FE) MG tablet Take 1 tablet (325 mg total) by mouth daily with breakfast. Please take for 1 month, may stop sooner if develop constipation 09/20/21 09/20/22  Stehler, Mel Almond C, PA-C  glucose blood (TRUE METRIX BLOOD GLUCOSE TEST) test strip Use to check blood sugar three times daily. 06/10/21   Charlott Rakes, MD  Insulin Glargine (BASAGLAR KWIKPEN) 100 UNIT/ML Inject 62 Units into the skin daily. 09/10/21   [name], MD  Insulin Pen Needle (TRUEPLUS 5-BEVEL PEN NEEDLES) 32G X 4 MM MISC  Use to inject Basaglar once daily. 05/10/21   Charlott Rakes, MD  oxyCODONE (OXY IR/ROXICODONE) 5 MG immediate release tablet Take 1 tablet (5 mg total) by mouth every 6 (six) hours as needed for severe pain. 09/20/21   Stehler, Pricilla Larsson, PA-C  Semaglutide, 1 MG/DOSE, 4 MG/3ML SOPN Inject 1 mg as directed once a week. 07/12/21   Charlott Rakes, MD  TRUEplus Lancets 28G MISC Use to check blood sugar three times daily. 06/10/21   Charlott Rakes, MD    Inpatient Medications: Scheduled Meds:  Continuous Infusions:  PRN Meds:   Allergies:    Allergies  Allergen Reactions   Aspirin Other (See Comments)    Upset stomach  Pt states he is not allergic to aspirin    Social History:   Social History   Socioeconomic History   Marital status: Legally Separated    Spouse name: Not on file   Number of children: 0   Years of education: Not on file   Highest education level: Not on file  Occupational History   Not on file  Tobacco Use   Smoking status: Former    Packs/day: 0.50    Types: Cigarettes    Quit date: 2017    Years since quitting: 6.6   Smokeless tobacco: Never  Vaping Use   Vaping Use: Some days  Substance and  Sexual Activity   Alcohol use: Yes    Comment: very occasional, maybe a beer or mixed drink once every few months   Drug use: Not Currently   Sexual activity: Not on file  Other Topics Concern   Not on file  Social History Narrative   Not on file   Social Determinants of Health   Financial Resource Strain: Not on file  Food Insecurity: Not on file  Transportation Needs: Not on file  Physical Activity: Not on file  Stress: Not on file  Social Connections: Not on file  Intimate Partner Violence: Not on file    Family History:   No family history on file. Family Status:  No family status information on file.    ROS:  Please see the history of present illness.  All other ROS reviewed and negative.     Physical Exam/Data:   Vitals:   09/28/21 0715 09/28/21 0730 09/28/21 0745 09/28/21 0800  BP: 131/79 113/77 119/81 112/74  Pulse: (!) 51 (!) 51 (!) 49 (!) 50  Resp: (!) 21 (!) 28 (!) 22 (!) 26  Temp:      SpO2: 96% 97% 97% 99%  Weight:      Height:       No intake or output data in the 24 hours ending 09/28/21 0817 Filed Weights   09/28/21 0430  Weight: 101 kg   Body mass index is 34.87 kg/m.   General: Ill appearing, NAD Skin: Warm, intact  Neck: Negative for carotid bruits. No JVD Lungs: LLL crackles, diminished in all other lobes. Breathing is mildly labored with speaking.  Cardiovascular: RRR with S1 S2. No murmurs Abdomen: Soft, non-tender, non-distended . No obvious abdominal masses. Extremities: No edema.  Neuro: Alert and oriented. No focal deficits. No facial asymmetry. MAE spontaneously. Psych: Responds to questions appropriately with normal affect.    EKG:  The EKG was personally reviewed and demonstrates: SB with J point elevation, HR 47bpm.   Telemetry:  Telemetry was personally reviewed and demonstrates:  SB with HR in th mid 50's.   Relevant CV Studies:  ECHO 12/24/2020:   1. Left  ventricular ejection fraction, by estimation, is 55 to 60%.  The  left ventricle has normal function. The left ventricle demonstrates  regional wall motion abnormalities (see scoring diagram/findings for  description). Left ventricular diastolic  parameters are consistent with Grade I diastolic dysfunction (impaired  relaxation).   2. Right ventricular systolic function is normal. The right ventricular  size is normal.   3. The mitral valve is normal in structure. Mild mitral valve  regurgitation. No evidence of mitral stenosis.   4. The aortic valve is normal in structure. Aortic valve regurgitation is  not visualized. Aortic valve sclerosis is present, with no evidence of  aortic valve stenosis.   5. The inferior vena cava is normal in size with greater than 50%  respiratory variability, suggesting right atrial pressure of 3 mmHg.   CATH 12/26/2020:     Prox Cx lesion is 60% stenosed.   Prox Cx to Mid Cx lesion is 90% stenosed.   1st Mrg-2 lesion is 80% stenosed.   Mid LAD lesion is 50% stenosed.   Mid LAD to Dist LAD lesion is 80% stenosed.   2nd Diag lesion is 80% stenosed.   RPAV lesion is 50% stenosed.   RV Branch-1 lesion is 70% stenosed.   RV Branch-2 lesion is 80% stenosed.   Prox RCA-1 lesion is 90% stenosed.   Prox RCA to Mid RCA lesion is 95% stenosed.   Mid RCA lesion is 20% stenosed.   Prox RCA-2 lesion is 65% stenosed.   Dist RCA lesion is 20% stenosed.   1st Diag lesion is 90% stenosed.   Prox LAD lesion is 30% stenosed.   Previously placed Mid Cx to Dist Cx stent (unknown type) is  widely patent.   Previously placed 1st Mrg-1 stent (unknown type) is  widely patent.   LV end diastolic pressure is severely elevated.   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  In this diabetic male with significant diffuse disease in the LAD, left circumflex, and right coronary artery recommend surgical evaluation for  consideration of CABG revascularization.   RECOMMENDATION: Surgical consultation for consideration of CABG.  We will reinitiate heparin later this evening.  Patient continues to be on intravenous nitroglycerin.  Increase medical therapy as blood pressure and heart rate allow.  Aggressive lipid-lowering therapy with target LDL in the 50s or below.     Laboratory Data:  Chemistry Recent Labs  Lab 09/28/21 0435  NA 135  K 4.2  CL 102  CO2 22  GLUCOSE 147*  BUN 50*  CREATININE 2.35*  CALCIUM 8.6*  GFRNONAA 31*  ANIONGAP 11    Total Protein  Date Value Ref Range Status  09/28/2021 6.2 (L) 6.5 - 8.1 g/dL Final   Albumin  Date Value Ref Range Status  09/28/2021 2.9 (L) 3.5 - 5.0 g/dL Final   AST  Date Value Ref Range Status  09/28/2021 39 15 - 41 U/L Final   ALT  Date Value Ref Range Status  09/28/2021 51 (H) 0 - 44 U/L Final   Alkaline Phosphatase  Date Value Ref Range Status  09/28/2021 117 38 - 126 U/L Final   Total Bilirubin  Date Value Ref Range Status  09/28/2021 1.3 (H) 0.3 - 1.2 mg/dL Final   Hematology Recent Labs  Lab 09/28/21 0435  WBC 17.2*  RBC 3.39*  HGB 9.9*  HCT 31.8*  MCV 93.8  MCH 29.2  MCHC 31.1  RDW 16.0*  PLT 513*   Cardiac EnzymesNo results for input(s): "TROPONINI" in the last 168 hours. No results for input(s): "TROPIPOC" in the last 168 hours.  BNP Recent Labs  Lab 09/28/21 0405  BNP 826.6*    DDimer No results for input(s): "DDIMER" in the last 168 hours. TSH:  Lab Results  Component Value Date   TSH 0.460 03/12/2021   Lipids: Lab Results  Component Value Date   CHOL 250 (H) 03/12/2021   HDL 31 (L) 03/12/2021   LDLCALC UNABLE TO CALCULATE IF TRIGLYCERIDE OVER 400 mg/dL 03/12/2021   LDLDIRECT 114.3 (H) 03/12/2021   TRIG 538 (H) 03/12/2021   CHOLHDL 8.1 03/12/2021   HgbA1c: Lab Results  Component Value Date   HGBA1C 6.5 (H) 09/10/2021    Radiology/Studies:  [facility] Chest Port 1 View  Result Date:  09/28/2021 CLINICAL DATA:  Shortness of breath. EXAM: PORTABLE CHEST 1 VIEW COMPARISON:  06/29/2021 FINDINGS: The cardio pericardial silhouette is enlarged. Interval improvement in aeration at the right lung base. Retrocardiac left base opacity persists, likely atelectasis although superimposed infection cannot be excluded. Pleural effusions seen previously not readily evident today. Telemetry leads overlie the chest. IMPRESSION: 1. Interval improvement in aeration at the right lung base. 2. Persistent retrocardiac left base opacity, likely atelectasis. 3. Small pleural effusions seen previously not evident today. Electronically Signed   By: Misty Stanley M.D.   On: 09/28/2021 04:59    Assessment and Plan:   Acute on chronic diastolic CHF with recent CABG x5 : Has been lethargic and SOB since discharge with worsening symptoms overnight prompting ED admission. No chest pain. CXR with atelectasis and pleural effusions and BNP elevated at 826. Lasix stopped at recent hospital discharge due to CKD. Treated with IV Lasix today 71m today. Repeat STAT echocardiogram today as there is concern for cardiac tamponade given recent CT surgery. ED MD reports bedside echo concerning for LV dysfunction. Last echo from 12/2020 with normal LVEF at 55-60%. Definitive plan once echo is complete.   Symptomatic bradycardia and hypotension: Carvedilol increased to 242mBID at recent hospital discharge for post operative atrial fibrillation found to have symptomatic bradycardia on EMS arrival treated with atropine. Remains in SB with rates in the mid 50's now. Hold AV nodal blocking agents for now. Hb stable, continue Eliquis.   Leukocytosis: Follow closely, some concern for infection process with recent surgery and presenting sepsis symptoms. Consider blood cx, procalcitonin. May require empiric abx.   Paroxsymal Atrial fibrillation: Postoperative AF noted during CABG hospitalization treated with Eliquis 47m24mID, amiodarone,  and carvedilol. HR on EMS arrival in the mid 40's. EKG with SB.   DM with hypoglycemia: CBG found to be 40 on EMS arrival. Treated with IV D10. Stabilized on last lab result.   Acute on chronic kidney disease, stage IIIa: Baseline creationine in the 1.4 range. Elevated today at 2.35. Hold nephrotoxic medications. Once STAT echocardiogram obtained, if no cardiac tamponade, will plan IV diuresis and follow renal function closely.   CVA: No new neuro changes. Continue current regimen  For questions or updates, please contact [facility] [place] consult [email or web address] for contact info under Cardiology/STEMI.   Signed, JKathyrn Drown-C [facility] Pager: [phone] 8:17 AM  I have seen and examined the patient along with JilKathyrn Drown-C.  I have reviewed the chart, notes and new data.  I agree with PA/NP's note.  Key new complaints: Not particularly interactive, lying in bed and grunting with each breath.  But awakes and answers question appropriately.  He is short of breath at rest.  Sore to touch over anterior chest, but not describing angina. Key examination changes: Diaphoretic, somnolent.  Regular rate and rhythm, relative bradycardia.  Heart tones are very distant.  Do not hear rubs or gallops or murmurs. Key new findings / data: Chest x-ray shows markedly enlarged cardiac silhouette.  ECG shows subtle diffuse ST segment elevation and sinus bradycardia, QS pattern in leads V1 and V2.  Elevated WBC 17.2.  Hemoglobin 9.9.  Marked worsening of renal function.  Recurrent problems with hypoglycemia, now has D10W infusion.  Lactic acidosis on arrival, improved.  BNP is high. Reviewing preliminary echo images as they are being acquired at the bedside, I am concerned that he has pericardial tamponade.  He has a moderate to large effusion that appears to be loculated around the lateral and posterior walls of the left ventricle.  There is very marked respiratory displacement of the  ventricular septum with each breath.  He has prominent variation in mitral inflow velocities with respiration, over 50%.  The inferior vena cava is plethoric.  PLAN: I am concerned that he has pericardial tamponade due to a loculated pericardial effusion, with secondary reduction in cardiac output leading to acute renal insufficiency.  Beta-blocker related bradycardia may also be limiting his ability to compensate for reduced stroke-volume. He is not overtly hypotensive.  He is fully anticoagulated with Eliquis. I worry that he will need evacuation of pericardial effusion/hematoma, but I hope we can delay this until his anticoagulant effect can be reversed. Evaded WBC and hypoglycemia raise concern for infection, but leukocytosis may be reactive and hypoglycemia related to renal insufficiency and insulin use. Discussed with Dr. Kipp Brood.  He is requesting a chest CT (without contrast) for more precise evaluation.  Sanda Klein, MD, [place] [phone] 09/28/2021, 10:03 AM

## 2021-09-28 NOTE — Op Note (Signed)
      [address]       [place] [address]             [phone]        09/28/2021  Patient:  GAEGE SANGALANG Pre-Op Dx: Moderate pericardial effusion with concern for tamponade   History of CABG   Acute renal failure Post-op Dx: Same Procedure: -Right video assisted thoracoscopy - Pericardial Window - Evacuation of pericardial and pleural effusion effusion   Surgeon and Role:      * Jackelyn Illingworth, Lucile Crater, MD - Primary  Anesthesia  general EBL: 11 ml Blood Administration: None Specimen: Pericardium  Drains: 19 F Blake chest tube in right chest Counts: correct   Indications: 61 year old male status post CABG on 09/12/2021 rhe presented with acute shortness of breath.  Echocardiogram showed a circumferential pericardial effusion with concern for tamponade. Findings: Normal pericardium.  There was significant clot upon entry.  There was no significant release of fluid.  The pericardium was irrigated with sterile water.  A 19 [place] Blake was left in place.  Operative Technique: After the risks, benefits and alternatives were thoroughly discussed, the patient was brought to the operative theatre.  Anesthesia was induced, and the patient was then placed in a lazy lateral decubitus position and was prepped and draped in normal sterile fashion.  An appropriate surgical pause was performed, and pre-operative antibiotics were dosed accordingly.  We began with 1cm incision in the anterior axillary line at the 4th intercostal space.  A 24mm trocar was then introduced.  The pleural cavity was then insufflated with Co2.  The 2 additional trocars were then introduced to triangulate the pericardium.  The pericardial sac was then incised, and a 2 x 2 cm window was created anteriorly to the phrenic nerve.  The pericardial fluid was serosanguineous, but there was mostly just clot.  The pericardium was removed, and 33F blake drain was then passed into the pericardial sac.  We watch  the remaining lobes re-expand.  The skin and soft tissue were closed with absorbable suture    The patient tolerated the procedure without any immediate complications, and was transferred to the PACU in stable condition.  Syvanna Ciolino Bary Leriche

## 2021-09-28 NOTE — H&P (Addendum)
History & Physical    Patient ID: [name] MRN: [id number], DOB/AGE: 1960/07/30   Admit date: 09/28/2021 Primary Physician: [name], MD Primary Cardiologist: Dr. Gwyndolyn Kaufman, MD   Patient Profile    [name] is a 61 y.o. male with a hx of DM2, CAD s/p recent CABG x5, CKD stage II, and CVA who is being seen today for the evaluation of cardiac tamponade at the request of Dr. Doren Custard.  Past Medical History   Past Medical History:  Diagnosis Date   AKI (acute kidney injury) ([place])    pt unaware of this   Anginal pain ([place])    CAD (coronary artery disease)    a. 03/2015 NSTEMI: LHC with severe 3V CAD  (70% mid RCA, 95% OM1, 90% distal LCx, 90% OM3, 80% prox LAD and 90% ost D1) s/p DES to mLAD w/ small dissction Rx with DES, staged ost Ramus PCI/DES and dLCx s/p PCI/DES    Chest pain 12/24/2020   Diabetes mellitus type 2 in obese [facility])    Diverticulosis    Dyspnea on exertion 03/16/2015   Dyspnea on exertion   Family history of adverse reaction to anesthesia    patient father- pt states after anesthesia his father "developed dementia"   Hypercholesteremia    Hypertension associated with diabetes ([place]) 03/16/2015   hypertension   NSTEMI (non-ST elevated myocardial infarction) ([place]) 03/17/2015   Obesity    Stroke ([place]) 2022   pt states he had a "mini stroke" during cardiac catheterization   Tobacco abuse     Past Surgical History:  Procedure Laterality Date   CARDIAC CATHETERIZATION N/A 03/19/2015   Procedure: Left Heart Cath and Coronary Angiography;  Surgeon: Lorretta Harp, MD;  Location: [facility];  Service: Cardiovascular;  Laterality: N/A;   CARDIAC CATHETERIZATION N/A 03/20/2015   Procedure: Coronary Stent Intervention;  Surgeon: Lorretta Harp, MD;  Location: [place] [facility];  Service: Cardiovascular;  Laterality: N/A;   CARDIAC CATHETERIZATION N/A 03/22/2015   Procedure: Coronary Stent Intervention;  Surgeon: Lorretta Harp, MD;   Location: [facility];  Service: Cardiovascular;  Laterality: N/A;   CORONARY ARTERY BYPASS GRAFT N/A 09/12/2021   Procedure: CORONARY ARTERY BYPASS GRAFTING (CABG) X 5 USING LEFT INTERNAL MAMMARY ARTERY AND ENDOSCOPICALLY HARVESTED RIGHT GREATER SAPHENOUS VEIN.;  Surgeon: [name], MD;  Location: [facility];  Service: Open Heart Surgery;  Laterality: N/A;   LEFT HEART CATH AND CORONARY ANGIOGRAPHY N/A 12/25/2020   Procedure: LEFT HEART CATH AND CORONARY ANGIOGRAPHY;  Surgeon: Troy Sine, MD;  Location: [facility];  Service: Cardiovascular;  Laterality: N/A;   LEFT HEART CATH AND CORONARY ANGIOGRAPHY N/A 12/26/2020   Procedure: LEFT HEART CATH AND CORONARY ANGIOGRAPHY;  Surgeon: Troy Sine, MD;  Location: [facility];  Service: Cardiovascular;  Laterality: N/A;   TEE WITHOUT CARDIOVERSION N/A 09/12/2021   Procedure: TRANSESOPHAGEAL ECHOCARDIOGRAM (TEE);  Surgeon: [name], MD;  Location: [facility];  Service: Open Heart Surgery;  Laterality: N/A;   TESTICLE SURGERY  1970   pt states testicle was ascended and had to be "pulled down"   TONSILLECTOMY     as a child     Allergies  Allergies  Allergen Reactions   Aspirin Other (See Comments)    Upset stomach  Pt states he is not allergic to aspirin    History of Present Illness    [name] is a 61yo M with  a hx as stated above who recently underwent CABG x5 09/12/21. He had no immediate post op complications however began having intermittent atrial fibrillation with rates in the 120's prior to discharge. His carvedilol was increased to 19m BID,  Plavix was stopped and Eliqius was initiated. By day of discharge, his rates were stable in the 80's. He had issues with elevated creatinine with a peak at 1.61 and a baseline at 1.4. His Lasix was stopped and was discharged 09/16/21 to his home. He reports that he initially felt good after being home with improved symptoms however began having issues with lethargy and DOE  which has been worsening over the last several weeks. He reports compliance with medications. He states that he has been very sedentary since discharge, only able to lay in his bed and go to the restroom. He can only walk short distances  before becoming SOB. He has no chest pain, LE edema, palpitations. Is having intermittent chills, no fevers, and orthopnea.    Early this AM, he woke with acute onset of SOB and diaphoresis. He called EMS for transport to the ED for further evaluation. On their arrival he was found to be bradycardiac with HR's in the 40's. Blood glucose was also in the 40's that was treated with atropine. On ED arrival, labs reveal AKI with creatinine elevated to 2.35. Lactic acid at 2.9. Hb at 9.9 with WBC at 17.2. CXR with persistent retrocardiac left base opacity, felt likely to be atelectasis with small pleural effusions.   Cardiology has been asked to follow with him. ADDEND: STAT echocardiogram obtained with concern for cardiac tamponade in the setting of recent surgery with loculated pericardial effusion, with secondary reduction in cardiac output leading to acute renal insufficiency. Beta-blocker related bradycardia may also be limiting his ability to compensate for reduced stroke-volume. Case discussed with Dr. LKipp Broodwith plans for cardiology admission and STAT CT.   Home Medications    Prior to Admission medications   Medication Sig Start Date End Date Taking? Authorizing Provider  amiodarone (PACERONE) 200 MG tablet Take 1 tablet by mouth  twice daily for 2 days then take 1 tablet daily thereafter 09/20/21   SThomasenia Sales BPricilla Larsson PA-C  amLODipine (NORVASC) 10 MG tablet Take 1 tablet (10 mg total) by mouth daily. 08/21/21   NCharlott Rakes MD  apixaban (ELIQUIS) 5 MG TABS tablet Take 1 tablet (5 mg total) by mouth 2 (two) times daily. 09/20/21   Stehler, BPricilla Larsson PA-C  aspirin EC 81 MG tablet Take 1 tablet (81 mg total) by mouth daily. Swallow whole. 09/20/21   Stehler, BPricilla Larsson PA-C  atorvastatin (LIPITOR) 80 MG tablet Take 1 tablet (80 mg total) by mouth daily. 09/06/21   NCharlott Rakes MD  blood glucose meter kit and supplies Dispense based on patient and insurance preference. Use up to four times daily as directed. (FOR ICD-10 E10.9, E11.9). 01/01/21   VGeradine Girt DO  Blood Glucose Monitoring Suppl (TRUE METRIX METER) w/Device KIT Use to check blood sugar three times daily. 06/10/21   NCharlott Rakes MD  carvedilol (COREG) 25 MG tablet Take 1 tablet (25 mg total) by mouth 2 (two) times daily with a meal. 09/20/21   Stehler, BPricilla Larsson PA-C  Continuous Blood Gluc Sensor (FREESTYLE LIBRE 2 SENSOR) MISC Utilize as directed q 14 days to monitor blood sugar. 05/10/21   NCharlott Rakes MD  Dulaglutide (TRULICITY) 1.5 MUP/1.0RPSOPN Inject 1.5 mg into the skin once a week. 06/10/21  Charlott Rakes, MD  ferrous sulfate 325 (65 FE) MG tablet Take 1 tablet (325 mg total) by mouth daily with breakfast. Please take for 1 month, may stop sooner if develop constipation 09/20/21 09/20/22  Stehler, Mel Almond C, PA-C  glucose blood (TRUE METRIX BLOOD GLUCOSE TEST) test strip Use to check blood sugar three times daily. 06/10/21   Charlott Rakes, MD  Insulin Glargine (BASAGLAR KWIKPEN) 100 UNIT/ML Inject 62 Units into the skin daily. 09/10/21   [name], MD  Insulin Pen Needle (TRUEPLUS 5-BEVEL PEN NEEDLES) 32G X 4 MM MISC Use to inject Basaglar once daily. 05/10/21   Charlott Rakes, MD  oxyCODONE (OXY IR/ROXICODONE) 5 MG immediate release tablet Take 1 tablet (5 mg total) by mouth every 6 (six) hours as needed for severe pain. 09/20/21   Stehler, Pricilla Larsson, PA-C  Semaglutide, 1 MG/DOSE, 4 MG/3ML SOPN Inject 1 mg as directed once a week. 07/12/21   Charlott Rakes, MD  TRUEplus Lancets 28G MISC Use to check blood sugar three times daily. 06/10/21   Charlott Rakes, MD   Family History    No family history on file.  Social History    Social History   Socioeconomic History   Marital  status: Legally Separated    Spouse name: Not on file   Number of children: 0   Years of education: Not on file   Highest education level: Not on file  Occupational History   Not on file  Tobacco Use   Smoking status: Former    Packs/day: 0.50    Types: Cigarettes    Quit date: 2017    Years since quitting: 6.6   Smokeless tobacco: Never  Vaping Use   Vaping Use: Some days  Substance and Sexual Activity   Alcohol use: Yes    Comment: very occasional, maybe a beer or mixed drink once every few months   Drug use: Not Currently   Sexual activity: Not on file  Other Topics Concern   Not on file  Social History Narrative   Not on file   Social Determinants of Health   Financial Resource Strain: Not on file  Food Insecurity: Not on file  Transportation Needs: Not on file  Physical Activity: Not on file  Stress: Not on file  Social Connections: Not on file  Intimate Partner Violence: Not on file    Review of Systems   See H&P above   All other systems reviewed and are otherwise negative except as noted above.  Physical Exam    Blood pressure 103/78, pulse (!) 56, temperature 98.6 F (37 C), temperature source Oral, resp. rate (!) 30, height 5' 7"  (1.702 m), weight 101 kg, SpO2 96 %.   General: Ill appearing, NAD Skin: Warm, intact  Neck: Negative for carotid bruits. No JVD Lungs: LLL crackles, diminished in all other lobes. Breathing is mildly labored with speaking.  Cardiovascular: RRR with S1 S2. No murmurs Abdomen: Soft, non-tender, non-distended . No obvious abdominal masses. Extremities: No edema.  Neuro: Alert and oriented. No focal deficits. No facial asymmetry. MAE spontaneously. Psych: Responds to questions appropriately with normal affect.    Labs    Troponin (Point of Care Test) No results for input(s): "TROPIPOC" in the last 72 hours. No results for input(s): "CKTOTAL", "CKMB", "TROPONINI" in the last 72 hours. Lab Results  Component Value Date    WBC 17.2 (H) 09/28/2021   HGB 9.9 (L) 09/28/2021   HCT 31.8 (L) 09/28/2021   MCV 93.8 09/28/2021  PLT 513 (H) 09/28/2021    Recent Labs  Lab 09/28/21 0435  NA 135  K 4.2  CL 102  CO2 22  BUN 50*  CREATININE 2.35*  CALCIUM 8.6*  PROT 6.2*  BILITOT 1.3*  ALKPHOS 117  ALT 51*  AST 39  GLUCOSE 147*   Lab Results  Component Value Date   CHOL 250 (H) 03/12/2021   HDL 31 (L) 03/12/2021   LDLCALC UNABLE TO CALCULATE IF TRIGLYCERIDE OVER 400 mg/dL 03/12/2021   TRIG 538 (H) 03/12/2021   Lab Results  Component Value Date   DDIMER 0.58 (H) 03/15/2015   Radiology Studies    DG Chest Port 1 View  Result Date: 09/28/2021 CLINICAL DATA:  Shortness of breath. EXAM: PORTABLE CHEST 1 VIEW COMPARISON:  06/29/2021 FINDINGS: The cardio pericardial silhouette is enlarged. Interval improvement in aeration at the right lung base. Retrocardiac left base opacity persists, likely atelectasis although superimposed infection cannot be excluded. Pleural effusions seen previously not readily evident today. Telemetry leads overlie the chest. IMPRESSION: 1. Interval improvement in aeration at the right lung base. 2. Persistent retrocardiac left base opacity, likely atelectasis. 3. Small pleural effusions seen previously not evident today. Electronically Signed   By: Misty Stanley M.D.   On: 09/28/2021 04:59   ECHO INTRAOPERATIVE TEE  Result Date: 09/26/2021  *INTRAOPERATIVE TRANSESOPHAGEAL REPORT *  Patient Name:   DAYTONA RETANA [facility]   Date of Exam: 09/12/2021 Medical Rec #:  [id number]      Height:       67.0 in Accession #:    [id number]     Weight:       225.0 lb Date of Birth:  [birth year]      BSA:          2.13 m Patient Age:    12 years       BP:           167/90 mmHg Patient Gender: M              HR:           93 bpm. Exam Location:  Anesthesiology Transesophogeal exam was perform intraoperatively during surgical procedure. Patient was closely monitored under general anesthesia during the entirety of  examination. Indications:     CAD Native Vessel i25.10 Sonographer:     Raquel Sarna Senior RDCS Performing Phys: Adele Barthel MD Diagnosing Phys: Adele Barthel MD Complications: No known complications during this procedure. POST-OP IMPRESSIONS Overall, there were no significant changes from pre-bypass. _ Left Ventricle: The left ventricle is unchanged from pre-bypass. _ Right Ventricle: The right ventricle appears unchanged from pre-bypass. _ Aorta: The aorta appears unchanged from pre-bypass. _ Left Atrium: The left atrium appears unchanged from pre-bypass. _ Left Atrial Appendage: The left atrial appendage appears unchanged from pre-bypass. _ Aortic Valve: The aortic valve appears unchanged from pre-bypass. _ Mitral Valve: The mitral valve appears unchanged from pre-bypass. _ Tricuspid Valve: The tricuspid valve appears unchanged from pre-bypass. _ Pulmonic Valve: The pulmonic valve appears unchanged from pre-bypass. _ Interatrial Septum: The interatrial septum appears unchanged from pre-bypass. _ Interventricular Septum: The interventricular septum appears unchanged from pre-bypass. _ Pericardium: The pericardium appears unchanged from pre-bypass. PRE-OP FINDINGS  Left Ventricle: The left ventricle has hyperdynamic systolic function, with an ejection fraction of >65%. The cavity size was normal. There is moderately increased left ventricular wall thickness. There is moderate concentric left ventricular hypertrophy. The heart is rotated and there is dyskenesis in the infero-septal area. Right Ventricle:  The right ventricle has normal systolic function. The cavity was normal. There is no increase in right ventricular wall thickness. Left Atrium: Left atrial size was normal in size. No left atrial/left atrial appendage thrombus was detected. Right Atrium: Right atrial size was normal in size. Interatrial Septum: No atrial level shunt detected by color flow Doppler. Pericardium: There is no evidence of pericardial  effusion. Mitral Valve: The mitral valve is normal in structure. Mitral valve regurgitation is trivial by color flow Doppler. Tricuspid Valve: The tricuspid valve was normal in structure. Tricuspid valve regurgitation is trivial by color flow Doppler. Aortic Valve: The aortic valve is tricuspid Aortic valve regurgitation is trivial by color flow Doppler. There is no stenosis of the aortic valve. There is mild thickening and mild calcification present on the aortic valve right coronary, left coronary and non-coronary cusps. Pulmonic Valve: The pulmonic valve was normal in structure. Pulmonic valve regurgitation is not visualized by color flow Doppler. Aorta: The aortic root, ascending aorta and aortic arch are normal in size and structure. +--------------+-------++ LEFT VENTRICLE        +--------------+-------++ PLAX 2D               +--------------+-------++ LVIDd:        4.00 cm +--------------+-------++ LVIDs:        2.40 cm +--------------+-------++ LV SV:        50 ml   +--------------+-------++ LV SV Index:  22.28   +--------------+-------++                       +--------------+-------++ +-------------+-----------++ AORTIC VALVE             +-------------+-----------++ AV Vmax:     127.00 cm/s +-------------+-----------++ AV Vmean:    92.100 cm/s +-------------+-----------++ AV VTI:      0.233 m     +-------------+-----------++ AV Peak Grad:6.5 mmHg    +-------------+-----------++ AV Mean Grad:4.0 mmHg    +-------------+-----------++  Adele Barthel MD Electronically signed by Adele Barthel MD Signature Date/Time: 09/12/2021/3:38:13 PM    Final (Updated)    DG Chest Port 1 View  Result Date: 09/14/2021 CLINICAL DATA:  Follow-up for CABG surgery. EXAM: PORTABLE CHEST 1 VIEW COMPARISON:  09/13/2021. FINDINGS: Persistent lung base opacities obscure the hemidiaphragms consistent with a combination of pleural effusions and atelectasis. Mid and upper lungs are  clear. Stable cardiac silhouette.  No mediastinal widening. Stable right internal jugular introducer sheath. No pneumothorax. IMPRESSION: 1. No change from the previous day's exam. 2. Persistent lung base opacities consistent with atelectasis and pleural effusions. No pulmonary edema, mediastinal widening or pneumothorax. Electronically Signed   By: Lajean Manes M.D.   On: 09/14/2021 09:39   DG Chest 1 View  Result Date: 09/13/2021 CLINICAL DATA:  Post chest tube removal EXAM: CHEST  1 VIEW COMPARISON:  Portable exam 1708 hours compared to 0510 hours FINDINGS: Interval removal of mediastinal drain, LEFT thoracostomy tube, and Swan-Ganz catheter. Enlargement of cardiac silhouette post CABG. Mediastinal contours and pulmonary vascularity normal. Bibasilar atelectasis. No acute infiltrate, pleural effusion, or pneumothorax. Epicardial pacing wires remain. IMPRESSION: No pneumothorax following chest tube removal. Bibasilar atelectasis. Electronically Signed   By: Lavonia [facility] M.D.   On: 09/13/2021 17:24   DG Chest Port 1 View  Result Date: 09/13/2021 CLINICAL DATA:  Sore chest.  Chest tube present EXAM: PORTABLE CHEST 1 VIEW COMPARISON:  Yesterday FINDINGS: Tracheal and esophageal extubation. Chest drains remain in place. Swan-Ganz catheter from right IJ approach with  tip at the main pulmonary artery. CABG with cardiomegaly and vascular pedicle widening that is stable. Stable atelectasis. No visible pneumothorax. IMPRESSION: 1. Extubation with stable inflation and atelectasis. 2. Remaining hardware in unremarkable position. Electronically Signed   By: Jorje Guild M.D.   On: 09/13/2021 07:15   DG Chest Port 1 View  Result Date: 09/12/2021 CLINICAL DATA:  Postop CABG. EXAM: PORTABLE CHEST 1 VIEW COMPARISON:  09/10/2021 FINDINGS: Item 1558 hours. Endotracheal tube tip is 5.2 cm above the base of the carina. NG tube tip overlies the gastric fundus left chest tube noted without pneumothorax or substantial  pleural effusion. Midline mediastinal/pericardial drain evident. Right IJ pulmonary artery catheter tip is in the pulmonary outflow tract or proximal right main pulmonary artery. Lung volumes are low with left base atelectasis and probable tiny left effusion. IMPRESSION: 1. Support apparatus as above. No pneumothorax. 2. Low lung volumes with left base atelectasis and probable tiny left effusion. Electronically Signed   By: Misty Stanley M.D.   On: 09/12/2021 16:31   DG Chest 2 View  Result Date: 09/10/2021 CLINICAL DATA:  Preop imaging EXAM: CHEST - 2 VIEW COMPARISON:  Radiographs 04/12/2021 FINDINGS: No focal consolidation, pleural effusion, or pneumothorax. Normal cardiomediastinal silhouette. No acute osseous abnormality. Coronary stenting. Aortic calcification. IMPRESSION: No active cardiopulmonary disease. Electronically Signed   By: Placido Sou M.D.   On: 09/10/2021 20:54    ECG & Cardiac Imaging    ECHO 12/24/2020:   1. Left ventricular ejection fraction, by estimation, is 55 to 60%. The  left ventricle has normal function. The left ventricle demonstrates  regional wall motion abnormalities (see scoring diagram/findings for  description). Left ventricular diastolic  parameters are consistent with Grade I diastolic dysfunction (impaired  relaxation).   2. Right ventricular systolic function is normal. The right ventricular  size is normal.   3. The mitral valve is normal in structure. Mild mitral valve  regurgitation. No evidence of mitral stenosis.   4. The aortic valve is normal in structure. Aortic valve regurgitation is  not visualized. Aortic valve sclerosis is present, with no evidence of  aortic valve stenosis.   5. The inferior vena cava is normal in size with greater than 50%  respiratory variability, suggesting right atrial pressure of 3 mmHg.    CATH 12/26/2020:      Prox Cx lesion is 60% stenosed.   Prox Cx to Mid Cx lesion is 90% stenosed.   1st Mrg-2 lesion is  80% stenosed.   Mid LAD lesion is 50% stenosed.   Mid LAD to Dist LAD lesion is 80% stenosed.   2nd Diag lesion is 80% stenosed.   RPAV lesion is 50% stenosed.   RV Branch-1 lesion is 70% stenosed.   RV Branch-2 lesion is 80% stenosed.   Prox RCA-1 lesion is 90% stenosed.   Prox RCA to Mid RCA lesion is 95% stenosed.   Mid RCA lesion is 20% stenosed.   Prox RCA-2 lesion is 65% stenosed.   Dist RCA lesion is 20% stenosed.   1st Diag lesion is 90% stenosed.   Prox LAD lesion is 30% stenosed.   Previously placed Mid Cx to Dist Cx stent (unknown type) is  widely patent.   Previously placed 1st Mrg-1 stent (unknown type) is  widely patent.   LV end diastolic pressure is severely elevated.   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  In this diabetic male with significant diffuse disease in the LAD, left circumflex, and right coronary artery recommend surgical evaluation for consideration of CABG revascularization.   RECOMMENDATION: Surgical consultation for consideration of CABG.  We will reinitiate heparin later this evening.  Patient continues to be on intravenous nitroglycerin.  Increase medical therapy as blood pressure and heart rate allow.  Aggressive lipid-lowering therapy with target LDL in the 50s or below.  Assessment & Plan    Acute on chronic diastolic CHF with recent CABGx5: Has been lethargic and SOB since discharge with worsening symptoms overnight prompting ED admission. No chest pain. CXR with atelectasis and pleural effusions and BNP elevated at 826. Lasix stopped at recent hospital discharge due to CKD. Treated with IV Lasix today 69m today. Repeat STAT echocardiogram today as there is concern for cardiac tamponade given recent CT surgery. ED MD reports bedside echo concerning for LV dysfunction. Last echo from 12/2020 with normal LVEF at 55-60%.EKG with SB and  diffuse ST elevation. Definitive plan once echo is complete. ADDEND: Cardiology to admit the patient with concern for cardiac tamponade. LVEF reduced on MD review. Case discussed with Dr. LKipp Broodand will obtain STAT CT for full assessment.    Symptomatic bradycardia and hypotension: Carvedilol increased to 230mBID at recent hospital discharge for post operative atrial fibrillation found to have symptomatic bradycardia on EMS arrival treated with atropine. Remains in SB with rates in the mid 50's now. Hold AV nodal blocking agents for now. Felt bradycardia is limiting ability to compensate for reduced LV function.    Leukocytosis: Follow closely, some concern for infection process with recent surgery. May be reactive. Consider blood cx, pro calcitonin if WBC continue to up trend. May require empiric abx.    Paroxsymal Atrial fibrillation: Postoperative AF noted during CABG hospitalization treated with Eliquis 69m59mID, amiodarone, and carvedilol. HR on EMS arrival in the mid 40's. EKG with SB with diffuse ST elevation. See plan above.    DM with hypoglycemia: CBG found to be 40 on EMS arrival. Treated with IV D10. Stabilized on last lab result.    Acute on chronic kidney disease, stage IIIa: Baseline creationine in the 1.4 range. Elevated today at 2.35. Hold nephrotoxic medications.No IV Lasix for now until more definitive plan established with CT surgery.    CVA: No new neuro changes. Continue current regimen   Severity of Illness: The appropriate patient status for this patient is INPATIENT. Inpatient status is judged to be reasonable and necessary in order to provide the required intensity of service to ensure the patient's safety. The patient's presenting symptoms, physical exam findings, and initial radiographic and laboratory data in the context of their chronic comorbidities is felt to place them at high risk for further clinical deterioration. Furthermore, it is not anticipated that the  patient will be medically stable for discharge from the hospital within 2 midnights of admission.   * I certify that at the point of admission it is my clinical judgment that the patient will require inpatient hospital care spanning beyond 2 midnights from the point of admission due to high intensity of service, high risk for further deterioration and high frequency of surveillance required.*    Signed, JilKathyrn Drown[facility] Pager: [phone], @NOW    PLAN: I am concerned that he has pericardial tamponade due to a loculated pericardial effusion, with secondary reduction in cardiac output leading to acute renal insufficiency.  Beta-blocker related bradycardia may also be limiting his ability to compensate  for reduced stroke-volume. He is not overtly hypotensive.  He is fully anticoagulated with Eliquis. I worry that he will need evacuation of pericardial effusion/hematoma, but I hope we can delay this until his anticoagulant effect can be reversed. Elevaed WBC and hypoglycemia raise concern for infection, but leukocytosis may be reactive and hypoglycemia related to renal insufficiency and insulin use. Discussed with Dr. Kipp Brood.  He is requesting a chest CT (without contrast) for more precise evaluation.   Sanda Klein, MD, [place] [phone] 09/28/2021, 10:03 AM

## 2021-09-28 NOTE — Progress Notes (Signed)
Diabetes protocol not initiated, Dr. Ermalene Postin could not tell me if pt. was going to ICU after surgery.

## 2021-09-28 NOTE — Anesthesia Preprocedure Evaluation (Signed)
Anesthesia Evaluation  Patient identified by MRN, date of birth, ID band Patient awake    Reviewed: Allergy & Precautions, NPO status , Patient's Chart, lab work & pertinent test results  History of Anesthesia Complications Negative for: history of anesthetic complications  Airway Mallampati: IV  TM Distance: >3 FB Neck ROM: Full    Dental  (+) Dental Advisory Given   Pulmonary shortness of breath, at rest and lying, Patient abstained from smoking., former smoker,     + decreased breath sounds      Cardiovascular hypertension, Pt. on medications and Pt. on home beta blockers + angina + CAD, + Past MI, + Cardiac Stents and + CABG   Rhythm:Regular  Pericardial effusion requiring surgical drainage   Neuro/Psych CVA, Residual Symptoms negative psych ROS   GI/Hepatic Neg liver ROS, GERD  ,  Endo/Other  diabetes, Insulin Dependent  Renal/GU CRFRenal diseaseLab Results      Component                Value               Date                      CREATININE               2.35 (H)            09/28/2021                Musculoskeletal negative musculoskeletal ROS (+)   Abdominal (+) + obese,   Peds  Hematology  (+) Blood dyscrasia, anemia , Lab Results      Component                Value               Date                      WBC                      17.2 (H)            09/28/2021                HGB                      9.9 (L)             09/28/2021                HCT                      31.8 (L)            09/28/2021                MCV                      93.8                09/28/2021                PLT                      513 (H)             09/28/2021              Anesthesia Other Findings  CAD  Reproductive/Obstetrics                             Anesthesia Physical Anesthesia Plan  ASA: 4 and emergent  Anesthesia Plan: General   Post-op Pain Management: Ofirmev IV (intra-op)*    Induction: Intravenous and Rapid sequence  PONV Risk Score and Plan: 2 and Dexamethasone and Ondansetron  Airway Management Planned: Video Laryngoscope Planned and Double Lumen EBT  Additional Equipment: Arterial line, CVP and Ultrasound Guidance Line Placement  Intra-op Plan:   Post-operative Plan: Possible Post-op intubation/ventilation  Informed Consent: I have reviewed the patients History and Physical, chart, labs and discussed the procedure including the risks, benefits and alternatives for the proposed anesthesia with the patient or authorized representative who has indicated his/her understanding and acceptance.     Dental advisory given  Plan Discussed with: CRNA  Anesthesia Plan Comments:         Anesthesia Quick Evaluation

## 2021-09-28 NOTE — ED Notes (Signed)
Atropine  0.5mg  bristojet iv

## 2021-09-28 NOTE — ED Notes (Signed)
Back from CT, alert, NAD, calm, no changes

## 2021-09-28 NOTE — ED Notes (Signed)
EDP at BS 

## 2021-09-28 NOTE — Anesthesia Procedure Notes (Signed)
Central Venous Catheter Insertion Performed by: Oleta Mouse, MD, anesthesiologist Start/End8/19/2023 12:42 PM, 09/28/2021 12:52 PM Patient location: Pre-op. Preanesthetic checklist: patient identified, IV checked, site marked, risks and benefits discussed, surgical consent, monitors and equipment checked, pre-op evaluation, timeout performed and anesthesia consent Lidocaine 1% used for infiltration Hand hygiene performed  and maximum sterile barriers used  Catheter size: 8 Fr Total catheter length 16. Central line was placed.Double lumen Procedure performed using ultrasound guided technique. Ultrasound Notes:anatomy identified, needle tip was noted to be adjacent to the nerve/plexus identified, no ultrasound evidence of intravascular and/or intraneural injection and image(s) printed for medical record Attempts: 1 Following insertion, dressing applied, line sutured and Biopatch. Post procedure assessment: blood return through all ports and free fluid flow  Patient tolerated the procedure well with no immediate complications.

## 2021-09-28 NOTE — ED Triage Notes (Signed)
The pt arrived by gems from home  he began to have chest pain earlier  he had bypass surgery  2weeks ago   ems found his blood sugar   to be 49  he was given  250mg  of d 10 w at the house  on arrival to the ed his cbg was 60  dextrose 50 given iv   pt alert he reports  that he feels some better

## 2021-09-28 NOTE — Consult Note (Signed)
[address]       [place] [address]             [phone]                    Thoms M Walsh [place] Medical Record #[id number] Date of Birth: 1960-11-09  Referring: No ref. provider found Primary Care: [name], MD Primary Cardiologist: Freada Bergeron, MD  Chief Complaint:    Chief Complaint  Patient presents with   Chest Pain    History of Present Illness:    [name] 61 y.o. male presents to the ED with 1 day hx of worsening shortness of breath.  He is s/p CABG on 8/3.  Bedside echo performed concerning for tamponade with posterior effusion    Past Medical History:  Diagnosis Date   AKI (acute kidney injury) ([place])    pt unaware of this   Anginal pain ([place])    CAD (coronary artery disease)    a. 03/2015 NSTEMI: LHC with severe 3V CAD  (70% mid RCA, 95% OM1, 90% distal LCx, 90% OM3, 80% prox LAD and 90% ost D1) s/p DES to mLAD w/ small dissction Rx with DES, staged ost Ramus PCI/DES and dLCx s/p PCI/DES    Chest pain 12/24/2020   Diabetes mellitus type 2 in obese [facility])    Diverticulosis    Dyspnea on exertion 03/16/2015   Dyspnea on exertion   Family history of adverse reaction to anesthesia    patient father- pt states after anesthesia his father "developed dementia"   Hypercholesteremia    Hypertension associated with diabetes ([place]) 03/16/2015   hypertension   NSTEMI (non-ST elevated myocardial infarction) ([place]) 03/17/2015   Obesity    Stroke ([place]) 2022   pt states he had a "mini stroke" during cardiac catheterization   Tobacco abuse     Past Surgical History:  Procedure Laterality Date   CARDIAC CATHETERIZATION N/A 03/19/2015   Procedure: Left Heart Cath and Coronary Angiography;  Surgeon: Lorretta Harp, MD;  Location: [facility];  Service: Cardiovascular;  Laterality: N/A;   CARDIAC CATHETERIZATION N/A 03/20/2015   Procedure: Coronary Stent Intervention;  Surgeon: Lorretta Harp, MD;  Location: [facility];  Service: Cardiovascular;  Laterality: N/A;   CARDIAC CATHETERIZATION N/A 03/22/2015   Procedure: Coronary Stent Intervention;  Surgeon: Lorretta Harp, MD;  Location: [facility];  Service: Cardiovascular;  Laterality: N/A;   CORONARY ARTERY BYPASS GRAFT N/A 09/12/2021   Procedure: CORONARY ARTERY BYPASS GRAFTING (CABG) X 5 USING LEFT INTERNAL MAMMARY ARTERY AND ENDOSCOPICALLY HARVESTED RIGHT GREATER SAPHENOUS VEIN.;  Surgeon: [name], MD;  Location: [facility];  Service: Open Heart Surgery;  Laterality: N/A;   LEFT HEART CATH AND CORONARY ANGIOGRAPHY N/A 12/25/2020   Procedure: LEFT HEART CATH AND CORONARY ANGIOGRAPHY;  Surgeon: Troy Sine, MD;  Location: [facility];  Service: Cardiovascular;  Laterality: N/A;   LEFT HEART CATH AND CORONARY ANGIOGRAPHY N/A 12/26/2020   Procedure: LEFT HEART CATH AND CORONARY ANGIOGRAPHY;  Surgeon: Troy Sine, MD;  Location: [facility];  Service: Cardiovascular;  Laterality: N/A;   TEE WITHOUT CARDIOVERSION N/A 09/12/2021   Procedure: TRANSESOPHAGEAL ECHOCARDIOGRAM (TEE);  Surgeon: [name], MD;  Location: [facility];  Service: Open Heart Surgery;  Laterality: N/A;   TESTICLE SURGERY  1970   pt states testicle was ascended and had to be "pulled  down"   TONSILLECTOMY     as a child    No family history on file.   Social History   Tobacco Use  Smoking Status Former   Packs/day: 0.50   Types: Cigarettes   Quit date: 2017   Years since quitting: 6.6  Smokeless Tobacco Never    Social History   Substance and Sexual Activity  Alcohol Use Yes   Comment: very occasional, maybe a beer or mixed drink once every few months     No Known Allergies  Current Facility-Administered Medications  Medication Dose Route Frequency Provider Last Rate Last Admin   dextrose 10 % infusion   Intravenous Continuous Godfrey Pick, MD 75 mL/hr at 09/28/21 0958 New Bag at 09/28/21 0958   Current Outpatient Medications   Medication Sig Dispense Refill   amiodarone (PACERONE) 200 MG tablet Take 1 tablet by mouth  twice daily for 2 days then take 1 tablet daily thereafter (Patient taking differently: Take 200 mg by mouth daily.) 60 tablet 1   amLODipine (NORVASC) 10 MG tablet Take 1 tablet (10 mg total) by mouth daily. 30 tablet 2   apixaban (ELIQUIS) 5 MG TABS tablet Take 1 tablet (5 mg total) by mouth 2 (two) times daily. 60 tablet 1   aspirin 325 MG tablet Take 325 mg by mouth daily.     atorvastatin (LIPITOR) 80 MG tablet Take 1 tablet (80 mg total) by mouth daily. 30 tablet 2   carvedilol (COREG) 25 MG tablet Take 1 tablet (25 mg total) by mouth 2 (two) times daily with a meal. 60 tablet 1   Dulaglutide (TRULICITY) 1.5 FY/1.0FB SOPN Inject 1.5 mg into the skin once a week. 2 mL 3   ferrous sulfate 325 (65 FE) MG tablet Take 1 tablet (325 mg total) by mouth daily with breakfast. Please take for 1 month, may stop sooner if develop constipation 30 tablet 3   Insulin Glargine (BASAGLAR KWIKPEN) 100 UNIT/ML Inject 62 Units into the skin daily. (Patient taking differently: Inject 71 Units into the skin daily.) 15 mL 2   oxyCODONE (OXY IR/ROXICODONE) 5 MG immediate release tablet Take 1 tablet (5 mg total) by mouth every 6 (six) hours as needed for severe pain. 30 tablet 0   Semaglutide, 1 MG/DOSE, 4 MG/3ML SOPN Inject 1 mg as directed once a week. 3 mL 2   aspirin EC 81 MG tablet Take 1 tablet (81 mg total) by mouth daily. Swallow whole. (Patient not taking: Reported on 09/28/2021) 30 tablet 12   Continuous Blood Gluc Sensor (FREESTYLE LIBRE 2 SENSOR) MISC Utilize as directed q 14 days to monitor blood sugar. 2 each 1   glucose blood (TRUE METRIX BLOOD GLUCOSE TEST) test strip Use to check blood sugar three times daily. 100 each 2   Insulin Pen Needle (TRUEPLUS 5-BEVEL PEN NEEDLES) 32G X 4 MM MISC Use to inject Basaglar once daily. 100 each 3   TRUEplus Lancets 28G MISC Use to check blood sugar three times daily. 100  each 2    Review of Systems  Constitutional:  Positive for malaise/fatigue.  Respiratory:  Positive for shortness of breath.   Cardiovascular:  Positive for chest pain.    PHYSICAL EXAMINATION: BP 120/88   Pulse (!) 57   Temp 98.6 F (37 C) (Oral)   Resp (!) 37   Ht 5\' 7"  (1.702 m)   Wt 101 kg   SpO2 98%   BMI 34.87 kg/m   Physical Exam Constitutional:  Appearance: He is obese. He is ill-appearing.  HENT:     Head: Normocephalic and atraumatic.  Cardiovascular:     Rate and Rhythm: Normal rate.  Pulmonary:     Effort: Pulmonary effort is normal.  Neurological:     Mental Status: He is alert.    Increased work of breathing  Diagnostic Studies & Laboratory data:     Recent Radiology Findings:   CT CHEST WO CONTRAST  Result Date: 09/28/2021 CLINICAL DATA:  Pleural effusion, history of CABG. EXAM: CT CHEST WITHOUT CONTRAST TECHNIQUE: Multidetector CT imaging of the chest was performed following the standard protocol without IV contrast. RADIATION DOSE REDUCTION: This exam was performed according to the departmental dose-optimization program which includes automated exposure control, adjustment of the mA and/or kV according to patient size and/or use of iterative reconstruction technique. COMPARISON:  Chest x-ray 09/14/2021, 09/28/2021 FINDINGS: Cardiovascular: No acute vascular findings. Stable cardiomegaly. Small pericardial effusion measuring 2 cm in thickness over the left ventricle. Prior CABG. Coronary artery atherosclerosis. Thoracic aortic atherosclerosis. Mediastinum/Nodes: No hilar, mediastinal or axillary lymphadenopathy. Trachea and esophagus are unremarkable. Heterogeneously enlarged right thyroid gland with multiple small hypodensities scattered within the right thyroid gland which has been previously characterized on thyroid ultrasound dated 03/12/2021. 1.5 cm left inferior thyroid nodule. On the ultrasound dated 03/12/2021 a FNA was recommended. Lungs/Pleura:  Small bilateral pleural effusions. Bibasilar atelectasis, left greater than right. No pneumothorax. Upper Abdomen: No acute upper abdominal abnormality. Musculoskeletal: No acute osseous abnormality. No aggressive osseous lesion. IMPRESSION: 1. Small bilateral pleural effusions with bibasilar atelectasis, left greater than right. 2. Cardiomegaly with small pericardial effusion. 3. Right thyromegaly which was previously characterized on a thyroid ultrasound dated 03/12/2021. The thyroid ultrasound dated 03/12/2021 recommended FNA of a nodule in the right and left thyroid gland. Please refer to the thyroid ultrasound dated 03/12/2021 for further detail. 4. Aortic Atherosclerosis (ICD10-I70.0). Electronically Signed   By: Kathreen Devoid M.D.   On: 09/28/2021 11:00   DG Chest Port 1 View  Result Date: 09/28/2021 CLINICAL DATA:  Shortness of breath. EXAM: PORTABLE CHEST 1 VIEW COMPARISON:  06/29/2021 FINDINGS: The cardio pericardial silhouette is enlarged. Interval improvement in aeration at the right lung base. Retrocardiac left base opacity persists, likely atelectasis although superimposed infection cannot be excluded. Pleural effusions seen previously not readily evident today. Telemetry leads overlie the chest. IMPRESSION: 1. Interval improvement in aeration at the right lung base. 2. Persistent retrocardiac left base opacity, likely atelectasis. 3. Small pleural effusions seen previously not evident today. Electronically Signed   By: Misty Stanley M.D.   On: 09/28/2021 04:59   ECHO INTRAOPERATIVE TEE  Result Date: 09/26/2021  *INTRAOPERATIVE TRANSESOPHAGEAL REPORT *  Patient Name:   [name] [facility]   Date of Exam: 09/12/2021 Medical Rec #:  [id number]      Height:       67.0 in Accession #:    [id number]     Weight:       225.0 lb Date of Birth:  August 15, 1960      BSA:          2.13 m Patient Age:    59 years       BP:           167/90 mmHg Patient Gender: M              HR:           93 bpm. Exam Location:   Anesthesiology Transesophogeal exam was perform intraoperatively during  surgical procedure. Patient was closely monitored under general anesthesia during the entirety of examination. Indications:     CAD Native Vessel i25.10 Sonographer:     Raquel Sarna Senior RDCS Performing Phys: Adele Barthel MD Diagnosing Phys: Adele Barthel MD Complications: No known complications during this procedure. POST-OP IMPRESSIONS Overall, there were no significant changes from pre-bypass. _ Left Ventricle: The left ventricle is unchanged from pre-bypass. _ Right Ventricle: The right ventricle appears unchanged from pre-bypass. _ Aorta: The aorta appears unchanged from pre-bypass. _ Left Atrium: The left atrium appears unchanged from pre-bypass. _ Left Atrial Appendage: The left atrial appendage appears unchanged from pre-bypass. _ Aortic Valve: The aortic valve appears unchanged from pre-bypass. _ Mitral Valve: The mitral valve appears unchanged from pre-bypass. _ Tricuspid Valve: The tricuspid valve appears unchanged from pre-bypass. _ Pulmonic Valve: The pulmonic valve appears unchanged from pre-bypass. _ Interatrial Septum: The interatrial septum appears unchanged from pre-bypass. _ Interventricular Septum: The interventricular septum appears unchanged from pre-bypass. _ Pericardium: The pericardium appears unchanged from pre-bypass. PRE-OP FINDINGS  Left Ventricle: The left ventricle has hyperdynamic systolic function, with an ejection fraction of >65%. The cavity size was normal. There is moderately increased left ventricular wall thickness. There is moderate concentric left ventricular hypertrophy. The heart is rotated and there is dyskenesis in the infero-septal area. Right Ventricle: The right ventricle has normal systolic function. The cavity was normal. There is no increase in right ventricular wall thickness. Left Atrium: Left atrial size was normal in size. No left atrial/left atrial appendage thrombus was detected. Right  Atrium: Right atrial size was normal in size. Interatrial Septum: No atrial level shunt detected by color flow Doppler. Pericardium: There is no evidence of pericardial effusion. Mitral Valve: The mitral valve is normal in structure. Mitral valve regurgitation is trivial by color flow Doppler. Tricuspid Valve: The tricuspid valve was normal in structure. Tricuspid valve regurgitation is trivial by color flow Doppler. Aortic Valve: The aortic valve is tricuspid Aortic valve regurgitation is trivial by color flow Doppler. There is no stenosis of the aortic valve. There is mild thickening and mild calcification present on the aortic valve right coronary, left coronary and non-coronary cusps. Pulmonic Valve: The pulmonic valve was normal in structure. Pulmonic valve regurgitation is not visualized by color flow Doppler. Aorta: The aortic root, ascending aorta and aortic arch are normal in size and structure. +--------------+-------++ LEFT VENTRICLE        +--------------+-------++ PLAX 2D               +--------------+-------++ LVIDd:        4.00 cm +--------------+-------++ LVIDs:        2.40 cm +--------------+-------++ LV SV:        50 ml   +--------------+-------++ LV SV Index:  22.28   +--------------+-------++                       +--------------+-------++ +-------------+-----------++ AORTIC VALVE             +-------------+-----------++ AV Vmax:     127.00 cm/s +-------------+-----------++ AV Vmean:    92.100 cm/s +-------------+-----------++ AV VTI:      0.233 m     +-------------+-----------++ AV Peak Grad:6.5 mmHg    +-------------+-----------++ AV Mean Grad:4.0 mmHg    +-------------+-----------++  Adele Barthel MD Electronically signed by Adele Barthel MD Signature Date/Time: 09/12/2021/3:38:13 PM    Final (Updated)    DG Chest Port 1 View  Result Date: 09/14/2021 CLINICAL DATA:  Follow-up  for CABG surgery. EXAM: PORTABLE CHEST 1 VIEW COMPARISON:   09/13/2021. FINDINGS: Persistent lung base opacities obscure the hemidiaphragms consistent with a combination of pleural effusions and atelectasis. Mid and upper lungs are clear. Stable cardiac silhouette.  No mediastinal widening. Stable right internal jugular introducer sheath. No pneumothorax. IMPRESSION: 1. No change from the previous day's exam. 2. Persistent lung base opacities consistent with atelectasis and pleural effusions. No pulmonary edema, mediastinal widening or pneumothorax. Electronically Signed   By: Lajean Manes M.D.   On: 09/14/2021 09:39   DG Chest 1 View  Result Date: 09/13/2021 CLINICAL DATA:  Post chest tube removal EXAM: CHEST  1 VIEW COMPARISON:  Portable exam 1708 hours compared to 0510 hours FINDINGS: Interval removal of mediastinal drain, LEFT thoracostomy tube, and Swan-Ganz catheter. Enlargement of cardiac silhouette post CABG. Mediastinal contours and pulmonary vascularity normal. Bibasilar atelectasis. No acute infiltrate, pleural effusion, or pneumothorax. Epicardial pacing wires remain. IMPRESSION: No pneumothorax following chest tube removal. Bibasilar atelectasis. Electronically Signed   By: Lavonia Dana M.D.   On: 09/13/2021 17:24   DG Chest Port 1 View  Result Date: 09/13/2021 CLINICAL DATA:  Sore chest.  Chest tube present EXAM: PORTABLE CHEST 1 VIEW COMPARISON:  Yesterday FINDINGS: Tracheal and esophageal extubation. Chest drains remain in place. Swan-Ganz catheter from right IJ approach with tip at the main pulmonary artery. CABG with cardiomegaly and vascular pedicle widening that is stable. Stable atelectasis. No visible pneumothorax. IMPRESSION: 1. Extubation with stable inflation and atelectasis. 2. Remaining hardware in unremarkable position. Electronically Signed   By: Jorje Guild M.D.   On: 09/13/2021 07:15   DG Chest Port 1 View  Result Date: 09/12/2021 CLINICAL DATA:  Postop CABG. EXAM: PORTABLE CHEST 1 VIEW COMPARISON:  09/10/2021 FINDINGS: Item 1558  hours. Endotracheal tube tip is 5.2 cm above the base of the carina. NG tube tip overlies the gastric fundus left chest tube noted without pneumothorax or substantial pleural effusion. Midline mediastinal/pericardial drain evident. Right IJ pulmonary artery catheter tip is in the pulmonary outflow tract or proximal right main pulmonary artery. Lung volumes are low with left base atelectasis and probable tiny left effusion. IMPRESSION: 1. Support apparatus as above. No pneumothorax. 2. Low lung volumes with left base atelectasis and probable tiny left effusion. Electronically Signed   By: Misty Stanley M.D.   On: 09/12/2021 16:31   DG Chest 2 View  Result Date: 09/10/2021 CLINICAL DATA:  Preop imaging EXAM: CHEST - 2 VIEW COMPARISON:  Radiographs 04/12/2021 FINDINGS: No focal consolidation, pleural effusion, or pneumothorax. Normal cardiomediastinal silhouette. No acute osseous abnormality. Coronary stenting. Aortic calcification. IMPRESSION: No active cardiopulmonary disease. Electronically Signed   By: Placido Sou M.D.   On: 09/10/2021 20:54       I have independently reviewed the above radiology studies  and reviewed the findings with the patient.   Recent Lab Findings: Lab Results  Component Value Date   WBC 17.2 (H) 09/28/2021   HGB 9.9 (L) 09/28/2021   HCT 31.8 (L) 09/28/2021   PLT 513 (H) 09/28/2021   GLUCOSE 147 (H) 09/28/2021   CHOL 250 (H) 03/12/2021   TRIG 538 (H) 03/12/2021   HDL 31 (L) 03/12/2021   LDLDIRECT 114.3 (H) 03/12/2021   LDLCALC UNABLE TO CALCULATE IF TRIGLYCERIDE OVER 400 mg/dL 03/12/2021   ALT 51 (H) 09/28/2021   AST 39 09/28/2021   NA 135 09/28/2021   K 4.2 09/28/2021   CL 102 09/28/2021   CREATININE 2.35 (H) 09/28/2021  BUN 50 (H) 09/28/2021   CO2 22 09/28/2021   TSH 0.460 03/12/2021   INR 2.2 (H) 09/28/2021   HGBA1C 6.5 (H) 09/10/2021         Assessment / Plan:   61 yo male with bilateral pleural effusion and pericardial effusion.  AKI, on  eliquis.  Will take to the OR for R VATS, pericardial window      Lajuana Matte 09/28/2021 11:29 AM

## 2021-09-28 NOTE — Transfer of Care (Signed)
Immediate Anesthesia Transfer of Care Note  Patient: PAVAN BRING  Procedure(s) Performed: PERICARDIAL WINDOW (Right)  Patient Location: ICU  Anesthesia Type:General  Level of Consciousness: drowsy and patient cooperative  Airway & Oxygen Therapy: Patient Spontanous Breathing and Patient connected to face mask oxygen  Post-op Assessment: Report given to RN and Post -op Vital signs reviewed and stable  Post vital signs: Reviewed and stable  Last Vitals:  Vitals Value Taken Time  BP 105/72 09/28/21 1500  Temp    Pulse 50 09/28/21 1502  Resp 31 09/28/21 1502  SpO2 96 % 09/28/21 1502  Vitals shown include unvalidated device data.  Last Pain:  Vitals:   09/28/21 1214  TempSrc: Oral  PainSc: 0-No pain         Complications: No notable events documented.

## 2021-09-28 NOTE — Anesthesia Procedure Notes (Signed)
Procedure Name: Intubation Date/Time: 09/28/2021 1:16 PM  Performed by: Georgia Duff, CRNAPre-anesthesia Checklist: Patient identified, Emergency Drugs available, Suction available and Patient being monitored Patient Re-evaluated:Patient Re-evaluated prior to induction Oxygen Delivery Method: Circle System Utilized Preoxygenation: Pre-oxygenation with 100% oxygen Induction Type: IV induction Ventilation: Mask ventilation without difficulty Laryngoscope Size: Glidescope and 4 Grade View: Grade I Tube type: Oral Endobronchial tube: Left and Double lumen EBT and 39 Fr Number of attempts: 1 Airway Equipment and Method: Stylet and Oral airway Placement Confirmation: ETT inserted through vocal cords under direct vision, positive ETCO2 and breath sounds checked- equal and bilateral Tube secured with: Tape Dental Injury: Teeth and Oropharynx as per pre-operative assessment

## 2021-09-28 NOTE — ED Notes (Signed)
To OR, SS37, care handed over to [place], [place]

## 2021-09-28 NOTE — Anesthesia Procedure Notes (Signed)
Arterial Line Insertion Start/End8/19/2023 12:10 PM, 09/28/2021 12:15 PM Performed by: Oleta Mouse, MD, CRNA  Lidocaine 1% used for infiltration Left, radial was placed Catheter size: 20 G  Attempts: 1 Procedure performed without using ultrasound guided technique. Following insertion, dressing applied and Biopatch. Post procedure assessment: normal and unchanged  Patient tolerated the procedure well with no immediate complications.

## 2021-09-28 NOTE — ED Notes (Signed)
Pt alert, NAD, calm, interactive, resps e/ mildly labored, tachypneic, VSS, HR low 50, denies pain, nausea, HA, dizziness. C/o sob, but improved, better than on arrival.

## 2021-09-28 NOTE — Progress Notes (Signed)
  Echocardiogram 2D Echocardiogram has been performed.  [name] 09/28/2021, 10:42 AM

## 2021-09-28 NOTE — ED Provider Notes (Signed)
Care of patient assumed from Dr. Sedonia Small.  This patient is 2 weeks postop from CABG.  He presents for shortness of breath.  On arrival, he was found to be tachypneic, bradycardic and hypotensive.  This did improve with atropine.  He was also found to be hypoglycemic and was given D10 prior to arrival and D50 in the ED.  He had worsening shortness of breath while in the ED.  This might have been iatrogenic after he got fluids.  He subsequently got Lasix.  He will require admission. Physical Exam  BP 113/79   Pulse (!) 50   Temp (!) 97.3 F (36.3 C)   Resp (!) 22   Ht 5\' 7"  (1.702 m)   Wt 101 kg   SpO2 99%   BMI 34.87 kg/m   Physical Exam Vitals and nursing note reviewed.  Constitutional:      General: He is not in acute distress.    Appearance: He is well-developed. He is ill-appearing. He is not toxic-appearing or diaphoretic.  HENT:     Head: Normocephalic and atraumatic.  Eyes:     Conjunctiva/sclera: Conjunctivae normal.  Cardiovascular:     Rate and Rhythm: Regular rhythm. Bradycardia present.     Heart sounds: No murmur heard. Pulmonary:     Effort: Pulmonary effort is normal. Tachypnea present. No respiratory distress.     Breath sounds: Rales present. No wheezing or rhonchi.     Comments: On 3 L of supplemental oxygen Abdominal:     Palpations: Abdomen is soft.     Tenderness: There is no abdominal tenderness.  Musculoskeletal:        General: No swelling. Normal range of motion.     Cervical back: Normal range of motion and neck supple.  Skin:    General: Skin is warm and dry.  Neurological:     General: No focal deficit present.     Mental Status: He is alert and oriented to person, place, and time.  Psychiatric:        Mood and Affect: Mood normal.        Behavior: Behavior normal.     Procedures  Procedures  ED Course / MDM    Medical Decision Making Amount and/or Complexity of Data Reviewed Labs: ordered. Radiology: ordered. ECG/medicine tests:  ordered.  Risk Prescription drug management. Decision regarding hospitalization.   On assessment, patient has no new complaints.  He remains tachypneic.  He has some grunting sounds with breathing which she attributes to wanting to cough.  He remains on 3 L of supplemental oxygen.  On bedside ultrasound, patient does have evidence of biapical B-lines and diminished cardiac function.  There is also likely a small pericardial effusion.  I spoke with cardiothoracic surgeon on-call, Dr. Kipp Brood who recommends admission to cardiology.  Cardiothoracic surgery will follow.  I spoke with Dr. Sallyanne Kuster who will come see the patient.

## 2021-09-29 ENCOUNTER — Inpatient Hospital Stay (HOSPITAL_COMMUNITY): Payer: Self-pay | Admitting: Anesthesiology

## 2021-09-29 ENCOUNTER — Inpatient Hospital Stay (HOSPITAL_COMMUNITY): Payer: Self-pay

## 2021-09-29 ENCOUNTER — Encounter (HOSPITAL_COMMUNITY)
Admission: EM | Disposition: A | Discharge status: Skilled Nursing Facility | Payer: Self-pay | Source: Home / Self Care | Attending: Surgery

## 2021-09-29 DIAGNOSIS — I480 Paroxysmal atrial fibrillation: Secondary | ICD-10-CM

## 2021-09-29 DIAGNOSIS — I25119 Atherosclerotic heart disease of native coronary artery with unspecified angina pectoris: Secondary | ICD-10-CM

## 2021-09-29 DIAGNOSIS — Z794 Long term (current) use of insulin: Secondary | ICD-10-CM

## 2021-09-29 DIAGNOSIS — I469 Cardiac arrest, cause unspecified: Secondary | ICD-10-CM

## 2021-09-29 DIAGNOSIS — E119 Type 2 diabetes mellitus without complications: Secondary | ICD-10-CM

## 2021-09-29 DIAGNOSIS — Z87891 Personal history of nicotine dependence: Secondary | ICD-10-CM

## 2021-09-29 DIAGNOSIS — Z9889 Other specified postprocedural states: Secondary | ICD-10-CM

## 2021-09-29 DIAGNOSIS — I308 Other forms of acute pericarditis: Secondary | ICD-10-CM

## 2021-09-29 DIAGNOSIS — J9601 Acute respiratory failure with hypoxia: Secondary | ICD-10-CM

## 2021-09-29 DIAGNOSIS — I495 Sick sinus syndrome: Secondary | ICD-10-CM

## 2021-09-29 DIAGNOSIS — I10 Essential (primary) hypertension: Secondary | ICD-10-CM

## 2021-09-29 DIAGNOSIS — I428 Other cardiomyopathies: Secondary | ICD-10-CM

## 2021-09-29 HISTORY — PX: CANNULATION FOR ECMO (EXTRACORPOREAL MEMBRANE OXYGENATION): SHX6796

## 2021-09-29 HISTORY — PX: TEE WITHOUT CARDIOVERSION: SHX5443

## 2021-09-29 LAB — SEDIMENTATION RATE: Sed Rate: 50 mm/hr — ABNORMAL HIGH (ref 0–16)

## 2021-09-29 LAB — CBC
HCT: 29.9 % — ABNORMAL LOW (ref 39.0–52.0)
HCT: 31.6 % — ABNORMAL LOW (ref 39.0–52.0)
HCT: 25.8 % — ABNORMAL LOW (ref 39.0–52.0)
Hemoglobin: 9.7 g/dL — ABNORMAL LOW (ref 13.0–17.0)
Hemoglobin: 10 g/dL — ABNORMAL LOW (ref 13.0–17.0)
Hemoglobin: 8.6 g/dL — ABNORMAL LOW (ref 13.0–17.0)
MCH: 29.1 pg (ref 26.0–34.0)
MCH: 27.4 pg (ref 26.0–34.0)
MCH: 28.3 pg (ref 26.0–34.0)
MCHC: 32.4 g/dL (ref 30.0–36.0)
MCHC: 31.6 g/dL (ref 30.0–36.0)
MCHC: 33.3 g/dL (ref 30.0–36.0)
MCV: 89.8 fL (ref 80.0–100.0)
MCV: 86.6 fL (ref 80.0–100.0)
MCV: 84.9 fL (ref 80.0–100.0)
Platelets: 586 10*3/uL — ABNORMAL HIGH (ref 150–400)
Platelets: 512 10*3/uL — ABNORMAL HIGH (ref 150–400)
Platelets: 396 10*3/uL (ref 150–400)
RBC: 3.33 MIL/uL — ABNORMAL LOW (ref 4.22–5.81)
RBC: 3.65 MIL/uL — ABNORMAL LOW (ref 4.22–5.81)
RBC: 3.04 MIL/uL — ABNORMAL LOW (ref 4.22–5.81)
RDW: 15.9 % — ABNORMAL HIGH (ref 11.5–15.5)
RDW: 18.7 % — ABNORMAL HIGH (ref 11.5–15.5)
RDW: 18 % — ABNORMAL HIGH (ref 11.5–15.5)
WBC: 20.3 10*3/uL — ABNORMAL HIGH (ref 4.0–10.5)
WBC: 14.7 10*3/uL — ABNORMAL HIGH (ref 4.0–10.5)
WBC: 24.8 10*3/uL — ABNORMAL HIGH (ref 4.0–10.5)
nRBC: 0.2 % (ref 0.0–0.2)
nRBC: 1 % — ABNORMAL HIGH (ref 0.0–0.2)
nRBC: 0.4 % — ABNORMAL HIGH (ref 0.0–0.2)

## 2021-09-29 LAB — POCT I-STAT 7, (LYTES, BLD GAS, ICA,H+H)
Acid-Base Excess: 3 mmol/L — ABNORMAL HIGH (ref 0.0–2.0)
Acid-Base Excess: 6 mmol/L — ABNORMAL HIGH (ref 0.0–2.0)
Acid-Base Excess: 7 mmol/L — ABNORMAL HIGH (ref 0.0–2.0)
Acid-Base Excess: 4 mmol/L — ABNORMAL HIGH (ref 0.0–2.0)
Acid-base deficit: 2 mmol/L (ref 0.0–2.0)
Acid-base deficit: 5 mmol/L — ABNORMAL HIGH (ref 0.0–2.0)
Acid-base deficit: 2 mmol/L (ref 0.0–2.0)
Acid-base deficit: 2 mmol/L (ref 0.0–2.0)
Acid-base deficit: 5 mmol/L — ABNORMAL HIGH (ref 0.0–2.0)
Bicarbonate: 23.5 mmol/L (ref 20.0–28.0)
Bicarbonate: 23.5 mmol/L (ref 20.0–28.0)
Bicarbonate: 22.9 mmol/L (ref 20.0–28.0)
Bicarbonate: 24.8 mmol/L (ref 20.0–28.0)
Bicarbonate: 18.5 mmol/L — ABNORMAL LOW (ref 20.0–28.0)
Bicarbonate: 30.5 mmol/L — ABNORMAL HIGH (ref 20.0–28.0)
Bicarbonate: 29.9 mmol/L — ABNORMAL HIGH (ref 20.0–28.0)
Bicarbonate: 31.3 mmol/L — ABNORMAL HIGH (ref 20.0–28.0)
Bicarbonate: 30.1 mmol/L — ABNORMAL HIGH (ref 20.0–28.0)
Calcium, Ion: 1.2 mmol/L (ref 1.15–1.40)
Calcium, Ion: 1.35 mmol/L (ref 1.15–1.40)
Calcium, Ion: 1.16 mmol/L (ref 1.15–1.40)
Calcium, Ion: 1.63 mmol/L (ref 1.15–1.40)
Calcium, Ion: 1.2 mmol/L (ref 1.15–1.40)
Calcium, Ion: 1.14 mmol/L — ABNORMAL LOW (ref 1.15–1.40)
Calcium, Ion: 1.03 mmol/L — ABNORMAL LOW (ref 1.15–1.40)
Calcium, Ion: 1.06 mmol/L — ABNORMAL LOW (ref 1.15–1.40)
Calcium, Ion: 1.11 mmol/L — ABNORMAL LOW (ref 1.15–1.40)
HCT: 29 % — ABNORMAL LOW (ref 39.0–52.0)
HCT: 31 % — ABNORMAL LOW (ref 39.0–52.0)
HCT: 30 % — ABNORMAL LOW (ref 39.0–52.0)
HCT: 26 % — ABNORMAL LOW (ref 39.0–52.0)
HCT: 27 % — ABNORMAL LOW (ref 39.0–52.0)
HCT: 25 % — ABNORMAL LOW (ref 39.0–52.0)
HCT: 20 % — ABNORMAL LOW (ref 39.0–52.0)
HCT: 21 % — ABNORMAL LOW (ref 39.0–52.0)
HCT: 29 % — ABNORMAL LOW (ref 39.0–52.0)
Hemoglobin: 9.9 g/dL — ABNORMAL LOW (ref 13.0–17.0)
Hemoglobin: 10.5 g/dL — ABNORMAL LOW (ref 13.0–17.0)
Hemoglobin: 10.2 g/dL — ABNORMAL LOW (ref 13.0–17.0)
Hemoglobin: 8.8 g/dL — ABNORMAL LOW (ref 13.0–17.0)
Hemoglobin: 9.2 g/dL — ABNORMAL LOW (ref 13.0–17.0)
Hemoglobin: 8.5 g/dL — ABNORMAL LOW (ref 13.0–17.0)
Hemoglobin: 6.8 g/dL — CL (ref 13.0–17.0)
Hemoglobin: 7.1 g/dL — ABNORMAL LOW (ref 13.0–17.0)
Hemoglobin: 9.9 g/dL — ABNORMAL LOW (ref 13.0–17.0)
O2 Saturation: 91 %
O2 Saturation: 98 %
O2 Saturation: 100 %
O2 Saturation: 99 %
O2 Saturation: 99 %
O2 Saturation: 100 %
O2 Saturation: 100 %
O2 Saturation: 100 %
O2 Saturation: 100 %
Patient temperature: 98
Patient temperature: 98.6
Patient temperature: 36.9
Patient temperature: 32.7
Patient temperature: 36
Patient temperature: 36.4
Patient temperature: 36.2
Patient temperature: 36.3
Potassium: 4.5 mmol/L (ref 3.5–5.1)
Potassium: 3.7 mmol/L (ref 3.5–5.1)
Potassium: 4.8 mmol/L (ref 3.5–5.1)
Potassium: 6.5 mmol/L (ref 3.5–5.1)
Potassium: 3.4 mmol/L — ABNORMAL LOW (ref 3.5–5.1)
Potassium: 4.4 mmol/L (ref 3.5–5.1)
Potassium: 4.1 mmol/L (ref 3.5–5.1)
Potassium: 4.1 mmol/L (ref 3.5–5.1)
Potassium: 4.1 mmol/L (ref 3.5–5.1)
Sodium: 132 mmol/L — ABNORMAL LOW (ref 135–145)
Sodium: 137 mmol/L (ref 135–145)
Sodium: 135 mmol/L (ref 135–145)
Sodium: 137 mmol/L (ref 135–145)
Sodium: 137 mmol/L (ref 135–145)
Sodium: 137 mmol/L (ref 135–145)
Sodium: 137 mmol/L (ref 135–145)
Sodium: 137 mmol/L (ref 135–145)
Sodium: 136 mmol/L (ref 135–145)
TCO2: 25 mmol/L (ref 22–32)
TCO2: 25 mmol/L (ref 22–32)
TCO2: 24 mmol/L (ref 22–32)
TCO2: 26 mmol/L (ref 22–32)
TCO2: 19 mmol/L — ABNORMAL LOW (ref 22–32)
TCO2: 32 mmol/L (ref 22–32)
TCO2: 31 mmol/L (ref 22–32)
TCO2: 33 mmol/L — ABNORMAL HIGH (ref 22–32)
TCO2: 32 mmol/L (ref 22–32)
pCO2 arterial: 43.2 mmHg (ref 32–48)
pCO2 arterial: 60.9 mmHg — ABNORMAL HIGH (ref 32–48)
pCO2 arterial: 36.8 mmHg (ref 32–48)
pCO2 arterial: 53.4 mmHg — ABNORMAL HIGH (ref 32–48)
pCO2 arterial: 23.5 mmHg — ABNORMAL LOW (ref 32–48)
pCO2 arterial: 61 mmHg — ABNORMAL HIGH (ref 32–48)
pCO2 arterial: 39 mmHg (ref 32–48)
pCO2 arterial: 43.6 mmHg (ref 32–48)
pCO2 arterial: 48.9 mmHg — ABNORMAL HIGH (ref 32–48)
pH, Arterial: 7.342 — ABNORMAL LOW (ref 7.35–7.45)
pH, Arterial: 7.195 — CL (ref 7.35–7.45)
pH, Arterial: 7.402 (ref 7.35–7.45)
pH, Arterial: 7.275 — ABNORMAL LOW (ref 7.35–7.45)
pH, Arterial: 7.486 — ABNORMAL HIGH (ref 7.35–7.45)
pH, Arterial: 7.302 — ABNORMAL LOW (ref 7.35–7.45)
pH, Arterial: 7.491 — ABNORMAL HIGH (ref 7.35–7.45)
pH, Arterial: 7.462 — ABNORMAL HIGH (ref 7.35–7.45)
pH, Arterial: 7.394 (ref 7.35–7.45)
pO2, Arterial: 65 mmHg — ABNORMAL LOW (ref 83–108)
pO2, Arterial: 140 mmHg — ABNORMAL HIGH (ref 83–108)
pO2, Arterial: 189 mmHg — ABNORMAL HIGH (ref 83–108)
pO2, Arterial: 163 mmHg — ABNORMAL HIGH (ref 83–108)
pO2, Arterial: 112 mmHg — ABNORMAL HIGH (ref 83–108)
pO2, Arterial: 588 mmHg — ABNORMAL HIGH (ref 83–108)
pO2, Arterial: 454 mmHg — ABNORMAL HIGH (ref 83–108)
pO2, Arterial: 560 mmHg — ABNORMAL HIGH (ref 83–108)
pO2, Arterial: 578 mmHg — ABNORMAL HIGH (ref 83–108)

## 2021-09-29 LAB — BASIC METABOLIC PANEL
Anion gap: 10 (ref 5–15)
Anion gap: 15 (ref 5–15)
Anion gap: 11 (ref 5–15)
BUN: 52 mg/dL — ABNORMAL HIGH (ref 8–23)
BUN: 54 mg/dL — ABNORMAL HIGH (ref 8–23)
BUN: 51 mg/dL — ABNORMAL HIGH (ref 8–23)
CO2: 21 mmol/L — ABNORMAL LOW (ref 22–32)
CO2: 21 mmol/L — ABNORMAL LOW (ref 22–32)
CO2: 23 mmol/L (ref 22–32)
Calcium: 8.2 mg/dL — ABNORMAL LOW (ref 8.9–10.3)
Calcium: 8.6 mg/dL — ABNORMAL LOW (ref 8.9–10.3)
Calcium: 7.7 mg/dL — ABNORMAL LOW (ref 8.9–10.3)
Chloride: 101 mmol/L (ref 98–111)
Chloride: 100 mmol/L (ref 98–111)
Chloride: 102 mmol/L (ref 98–111)
Creatinine, Ser: 2.72 mg/dL — ABNORMAL HIGH (ref 0.61–1.24)
Creatinine, Ser: 3.28 mg/dL — ABNORMAL HIGH (ref 0.61–1.24)
Creatinine, Ser: 3.05 mg/dL — ABNORMAL HIGH (ref 0.61–1.24)
GFR, Estimated: 26 mL/min — ABNORMAL LOW (ref >60)
GFR, Estimated: 21 mL/min — ABNORMAL LOW (ref >60)
GFR, Estimated: 22 mL/min — ABNORMAL LOW (ref >60)
Glucose, Bld: 64 mg/dL — ABNORMAL LOW (ref 70–99)
Glucose, Bld: 121 mg/dL — ABNORMAL HIGH (ref 70–99)
Glucose, Bld: 225 mg/dL — ABNORMAL HIGH (ref 70–99)
Potassium: 4.5 mmol/L (ref 3.5–5.1)
Potassium: 4 mmol/L (ref 3.5–5.1)
Potassium: 4.2 mmol/L (ref 3.5–5.1)
Sodium: 132 mmol/L — ABNORMAL LOW (ref 135–145)
Sodium: 136 mmol/L (ref 135–145)
Sodium: 136 mmol/L (ref 135–145)

## 2021-09-29 LAB — COMPREHENSIVE METABOLIC PANEL
ALT: 82 U/L — ABNORMAL HIGH (ref 0–44)
AST: 197 U/L — ABNORMAL HIGH (ref 15–41)
Albumin: 2 g/dL — ABNORMAL LOW (ref 3.5–5.0)
Alkaline Phosphatase: 53 U/L (ref 38–126)
Anion gap: 11 (ref 5–15)
BUN: 50 mg/dL — ABNORMAL HIGH (ref 8–23)
CO2: 26 mmol/L (ref 22–32)
Calcium: 7.7 mg/dL — ABNORMAL LOW (ref 8.9–10.3)
Chloride: 102 mmol/L (ref 98–111)
Creatinine, Ser: 2.92 mg/dL — ABNORMAL HIGH (ref 0.61–1.24)
GFR, Estimated: 24 mL/min — ABNORMAL LOW (ref >60)
Glucose, Bld: 238 mg/dL — ABNORMAL HIGH (ref 70–99)
Potassium: 4.3 mmol/L (ref 3.5–5.1)
Sodium: 139 mmol/L (ref 135–145)
Total Bilirubin: 1.7 mg/dL — ABNORMAL HIGH (ref 0.3–1.2)
Total Protein: 3.7 g/dL — ABNORMAL LOW (ref 6.5–8.1)

## 2021-09-29 LAB — ECHO INTRAOPERATIVE TEE
Height: 67 in
S' Lateral: 4.1 cm
Weight: 3597.91 oz

## 2021-09-29 LAB — CBC WITH DIFFERENTIAL/PLATELET
Abs Immature Granulocytes: 0.35 10*3/uL — ABNORMAL HIGH (ref 0.00–0.07)
Basophils Absolute: 0 10*3/uL (ref 0.0–0.1)
Basophils Relative: 0 %
Eosinophils Absolute: 0 10*3/uL (ref 0.0–0.5)
Eosinophils Relative: 0 %
HCT: 24.4 % — ABNORMAL LOW (ref 39.0–52.0)
Hemoglobin: 8.2 g/dL — ABNORMAL LOW (ref 13.0–17.0)
Immature Granulocytes: 2 %
Lymphocytes Relative: 12 %
Lymphs Abs: 2.2 10*3/uL (ref 0.7–4.0)
MCH: 27.6 pg (ref 26.0–34.0)
MCHC: 33.6 g/dL (ref 30.0–36.0)
MCV: 82.2 fL (ref 80.0–100.0)
Monocytes Absolute: 2.1 10*3/uL — ABNORMAL HIGH (ref 0.1–1.0)
Monocytes Relative: 12 %
Neutro Abs: 13 10*3/uL — ABNORMAL HIGH (ref 1.7–7.7)
Neutrophils Relative %: 74 %
Platelets: 258 10*3/uL (ref 150–400)
RBC: 2.97 MIL/uL — ABNORMAL LOW (ref 4.22–5.81)
RDW: 17.7 % — ABNORMAL HIGH (ref 11.5–15.5)
WBC: 17.7 10*3/uL — ABNORMAL HIGH (ref 4.0–10.5)
nRBC: 0.3 % — ABNORMAL HIGH (ref 0.0–0.2)

## 2021-09-29 LAB — PROTIME-INR
INR: 3 — ABNORMAL HIGH (ref 0.8–1.2)
Prothrombin Time: 31 seconds — ABNORMAL HIGH (ref 11.4–15.2)

## 2021-09-29 LAB — HEPARIN LEVEL (UNFRACTIONATED): Heparin Unfractionated: >1.1 IU/mL — ABNORMAL HIGH (ref 0.30–0.70)

## 2021-09-29 LAB — TRAUMA TEG PANEL
CFF Max Amplitude: 25.2 mm (ref 15–32)
Citrated Kaolin (R): 11.9 min — ABNORMAL HIGH (ref 4.6–9.1)
Citrated Rapid TEG (MA): 66.7 mm (ref 52–70)
Lysis at 30 Minutes: 0 % (ref 0.0–2.6)

## 2021-09-29 LAB — POCT I-STAT, CHEM 8
BUN: 45 mg/dL — ABNORMAL HIGH (ref 8–23)
Calcium, Ion: 1.19 mmol/L (ref 1.15–1.40)
Chloride: 97 mmol/L — ABNORMAL LOW (ref 98–111)
Creatinine, Ser: 3.4 mg/dL — ABNORMAL HIGH (ref 0.61–1.24)
Glucose, Bld: 213 mg/dL — ABNORMAL HIGH (ref 70–99)
HCT: 26 % — ABNORMAL LOW (ref 39.0–52.0)
Hemoglobin: 8.8 g/dL — ABNORMAL LOW (ref 13.0–17.0)
Potassium: 3.4 mmol/L — ABNORMAL LOW (ref 3.5–5.1)
Sodium: 137 mmol/L (ref 135–145)
TCO2: 18 mmol/L — ABNORMAL LOW (ref 22–32)

## 2021-09-29 LAB — LACTIC ACID, PLASMA
Lactic Acid, Venous: 4.6 mmol/L (ref 0.5–1.9)
Lactic Acid, Venous: 5.6 mmol/L (ref 0.5–1.9)
Lactic Acid, Venous: 2.3 mmol/L (ref 0.5–1.9)

## 2021-09-29 LAB — GLOBAL TEG PANEL
CFF Max Amplitude: 31 mm (ref 15–32)
CK with Heparinase (R): 11.4 min — ABNORMAL HIGH (ref 4.3–8.3)
Citrated Functional Fibrinogen: 565.7 mg/dL (ref 278–581)
Citrated Rapid TEG (MA): 70.6 mm — ABNORMAL HIGH (ref 52–70)

## 2021-09-29 LAB — GLUCOSE, CAPILLARY
Glucose-Capillary: 104 mg/dL — ABNORMAL HIGH (ref 70–99)
Glucose-Capillary: 134 mg/dL — ABNORMAL HIGH (ref 70–99)
Glucose-Capillary: 212 mg/dL — ABNORMAL HIGH (ref 70–99)
Glucose-Capillary: 214 mg/dL — ABNORMAL HIGH (ref 70–99)
Glucose-Capillary: 57 mg/dL — ABNORMAL LOW (ref 70–99)
Glucose-Capillary: 62 mg/dL — ABNORMAL LOW (ref 70–99)
Glucose-Capillary: 66 mg/dL — ABNORMAL LOW (ref 70–99)

## 2021-09-29 LAB — LACTATE DEHYDROGENASE: LDH: 467 U/L — ABNORMAL HIGH (ref 98–192)

## 2021-09-29 LAB — MAGNESIUM
Magnesium: 2.4 mg/dL (ref 1.7–2.4)
Magnesium: 2.2 mg/dL (ref 1.7–2.4)

## 2021-09-29 LAB — PREPARE RBC (CROSSMATCH)

## 2021-09-29 LAB — APTT: aPTT: 93 seconds — ABNORMAL HIGH (ref 24–36)

## 2021-09-29 LAB — HEPATIC FUNCTION PANEL
ALT: 126 U/L — ABNORMAL HIGH (ref 0–44)
AST: 247 U/L — ABNORMAL HIGH (ref 15–41)
Albumin: 2.2 g/dL — ABNORMAL LOW (ref 3.5–5.0)
Alkaline Phosphatase: 65 U/L (ref 38–126)
Bilirubin, Direct: 0.5 mg/dL — ABNORMAL HIGH (ref 0.0–0.2)
Indirect Bilirubin: 0.7 mg/dL (ref 0.3–0.9)
Total Bilirubin: 1.2 mg/dL (ref 0.3–1.2)
Total Protein: 3.9 g/dL — ABNORMAL LOW (ref 6.5–8.1)

## 2021-09-29 LAB — BLOOD PRODUCT ORDER (VERBAL) VERIFICATION

## 2021-09-29 LAB — FIBRINOGEN: Fibrinogen: 325 mg/dL (ref 210–475)

## 2021-09-29 SURGERY — CANNULATION FOR ECMO (EXTRACORPOREAL MEMBRANE OXYGENATION)
Anesthesia: General

## 2021-09-29 MED ORDER — VANCOMYCIN HCL 1500 MG/300ML IV SOLN
1500.0000 mg | Freq: Once | INTRAVENOUS | Status: DC
  Filled 2021-09-29: qty 300

## 2021-09-29 MED ORDER — HEPARIN SODIUM (PORCINE) 1000 UNIT/ML IJ SOLN
INTRAMUSCULAR | Status: AC
  Filled 2021-09-29: qty 1

## 2021-09-29 MED ORDER — MIDAZOLAM BOLUS VIA INFUSION
0.0000 mg | INTRAVENOUS | Status: DC | PRN
  Administered 2021-10-05 – 2021-10-06 (×4): 2 mg via INTRAVENOUS
  Administered 2021-10-06: 5 mg via INTRAVENOUS
  Administered 2021-10-10 (×4): 2 mg via INTRAVENOUS
  Administered 2021-10-11 (×2): 4 mg via INTRAVENOUS
  Administered 2021-10-12 – 2021-10-13 (×2): 5 mg via INTRAVENOUS

## 2021-09-29 MED ORDER — 0.9 % SODIUM CHLORIDE (POUR BTL) OPTIME
TOPICAL | Status: DC | PRN
  Administered 2021-09-29: 1000 mL

## 2021-09-29 MED ORDER — SODIUM CHLORIDE 0.9% IV SOLUTION: Freq: Once | INTRAVENOUS | Status: AC

## 2021-09-29 MED ORDER — MANNITOL 20 % IV SOLN
Freq: Once | INTRAVENOUS | Status: DC
  Filled 2021-09-29: qty 13

## 2021-09-29 MED ORDER — HEPARIN SODIUM (PORCINE) 1000 UNIT/ML IJ SOLN
INTRAMUSCULAR | Status: DC | PRN
  Administered 2021-09-29: 7000 [IU] via INTRAVENOUS

## 2021-09-29 MED ORDER — MIDAZOLAM HCL (PF) 10 MG/2ML IJ SOLN
INTRAMUSCULAR | Status: AC
  Filled 2021-09-29: qty 2

## 2021-09-29 MED ORDER — PHENYLEPHRINE HCL-NACL 20-0.9 MG/250ML-% IV SOLN
30.0000 ug/min | INTRAVENOUS | Status: DC
  Filled 2021-09-29: qty 250

## 2021-09-29 MED ORDER — LACTATED RINGERS IV SOLN: INTRAVENOUS | Status: DC | PRN

## 2021-09-29 MED ORDER — EPINEPHRINE 1 MG/10ML IJ SOSY
PREFILLED_SYRINGE | INTRAMUSCULAR | Status: AC
  Filled 2021-09-29: qty 30

## 2021-09-29 MED ORDER — MIDAZOLAM HCL (PF) 5 MG/ML IJ SOLN
INTRAMUSCULAR | Status: DC | PRN
  Administered 2021-09-29: 3 mg via INTRAVENOUS

## 2021-09-29 MED ORDER — AMIODARONE HCL IN DEXTROSE 360-4.14 MG/200ML-% IV SOLN
60.0000 mg/h | INTRAVENOUS | Status: AC
  Administered 2021-09-29: 60 mg/h via INTRAVENOUS
  Filled 2021-09-29: qty 200

## 2021-09-29 MED ORDER — NOREPINEPHRINE 4 MG/250ML-% IV SOLN
INTRAVENOUS | Status: AC
  Administered 2021-09-29: 4 ug/min via INTRAVENOUS
  Filled 2021-09-29: qty 250

## 2021-09-29 MED ORDER — NOREPINEPHRINE 4 MG/250ML-% IV SOLN
0.0000 ug/min | INTRAVENOUS | Status: AC
  Administered 2021-09-29: 5 ug/min via INTRAVENOUS
  Filled 2021-09-29: qty 250

## 2021-09-29 MED ORDER — ALBUMIN HUMAN 5 % IV SOLN
12.5000 g | INTRAVENOUS | Status: AC | PRN
  Administered 2021-09-29 – 2021-09-30 (×4): 12.5 g via INTRAVENOUS
  Filled 2021-09-29 (×4): qty 250

## 2021-09-29 MED ORDER — MIDAZOLAM HCL 2 MG/2ML IJ SOLN
INTRAMUSCULAR | Status: AC
  Administered 2021-09-29: 2 mg
  Filled 2021-09-29: qty 4

## 2021-09-29 MED ORDER — CEFAZOLIN SODIUM-DEXTROSE 2-4 GM/100ML-% IV SOLN
2.0000 g | INTRAVENOUS | Status: DC
  Filled 2021-09-29: qty 100

## 2021-09-29 MED ORDER — MIDAZOLAM-SODIUM CHLORIDE 100-0.9 MG/100ML-% IV SOLN
0.0000 mg/h | INTRAVENOUS | Status: DC
  Administered 2021-10-01 – 2021-10-02 (×3): 6 mg/h via INTRAVENOUS
  Administered 2021-10-03: 3 mg/h via INTRAVENOUS
  Administered 2021-10-03: 4 mg/h via INTRAVENOUS
  Administered 2021-10-04: 18 mg/h via INTRAVENOUS
  Administered 2021-10-04 – 2021-10-07 (×16): 30 mg/h via INTRAVENOUS
  Administered 2021-10-07: 15 mg/h via INTRAVENOUS
  Administered 2021-10-07 (×3): 30 mg/h via INTRAVENOUS
  Administered 2021-10-08: 8 mg/h via INTRAVENOUS
  Administered 2021-10-08 – 2021-10-10 (×5): 7 mg/h via INTRAVENOUS
  Administered 2021-10-11: 25 mg/h via INTRAVENOUS
  Administered 2021-10-11: 20 mg/h via INTRAVENOUS
  Administered 2021-10-11: 15 mg/h via INTRAVENOUS
  Administered 2021-10-11 (×2): 20 mg/h via INTRAVENOUS
  Administered 2021-10-12: 19 mg/h via INTRAVENOUS
  Administered 2021-10-12 (×5): 25 mg/h via INTRAVENOUS
  Administered 2021-10-13: 15 mg/h via INTRAVENOUS
  Administered 2021-10-13: 5 mg/h via INTRAVENOUS
  Administered 2021-10-13: 15 mg/h via INTRAVENOUS
  Filled 2021-09-29: qty 200
  Filled 2021-09-29 (×20): qty 100
  Filled 2021-09-29: qty 200
  Filled 2021-09-29: qty 100
  Filled 2021-09-29: qty 200
  Filled 2021-09-29 (×6): qty 100
  Filled 2021-09-29: qty 200
  Filled 2021-09-29 (×2): qty 100
  Filled 2021-09-29: qty 200
  Filled 2021-09-29 (×9): qty 100

## 2021-09-29 MED ORDER — NITROGLYCERIN IN D5W 200-5 MCG/ML-% IV SOLN
2.0000 ug/min | INTRAVENOUS | Status: DC
  Filled 2021-09-29: qty 250

## 2021-09-29 MED ORDER — SODIUM CHLORIDE 0.9 % IV SOLN
20.0000 ug | Freq: Once | INTRAVENOUS | Status: AC
  Administered 2021-09-29: 20 ug via INTRAVENOUS
  Filled 2021-09-29: qty 5

## 2021-09-29 MED ORDER — PANTOPRAZOLE SODIUM 40 MG IV SOLR
40.0000 mg | Freq: Every day | INTRAVENOUS | Status: DC
  Administered 2021-09-29 – 2021-10-13 (×15): 40 mg via INTRAVENOUS
  Filled 2021-09-29 (×15): qty 10

## 2021-09-29 MED ORDER — ROCURONIUM BROMIDE 10 MG/ML (PF) SYRINGE
PREFILLED_SYRINGE | INTRAVENOUS | Status: AC
  Filled 2021-09-29: qty 10

## 2021-09-29 MED ORDER — ROCURONIUM BROMIDE 10 MG/ML (PF) SYRINGE
PREFILLED_SYRINGE | INTRAVENOUS | Status: DC | PRN
  Administered 2021-09-29 (×2): 50 mg via INTRAVENOUS

## 2021-09-29 MED ORDER — HEPARIN 6000 UNIT IRRIGATION SOLUTION
Status: DC | PRN
  Administered 2021-09-29: 1

## 2021-09-29 MED ORDER — ASPIRIN 81 MG PO CHEW
324.0000 mg | CHEWABLE_TABLET | Freq: Two times a day (BID) | ORAL | Status: DC
  Administered 2021-10-01 – 2021-10-06 (×8): 324 mg
  Filled 2021-09-29 (×10): qty 4

## 2021-09-29 MED ORDER — FENTANYL CITRATE (PF) 250 MCG/5ML IJ SOLN
INTRAMUSCULAR | Status: AC
  Filled 2021-09-29: qty 5

## 2021-09-29 MED ORDER — DEXMEDETOMIDINE HCL IN NACL 400 MCG/100ML IV SOLN
0.1000 ug/kg/h | INTRAVENOUS | Status: DC
  Filled 2021-09-29: qty 100

## 2021-09-29 MED ORDER — SODIUM CHLORIDE 0.9% IV SOLUTION: Freq: Once | INTRAVENOUS | Status: DC

## 2021-09-29 MED ORDER — SODIUM BICARBONATE 8.4 % IV SOLN
INTRAVENOUS | Status: DC | PRN
  Administered 2021-09-29: 50 meq via INTRAVENOUS

## 2021-09-29 MED ORDER — SODIUM CHLORIDE 0.9 % IV SOLN
1.0000 g | Freq: Two times a day (BID) | INTRAVENOUS | Status: DC
  Filled 2021-09-29: qty 20

## 2021-09-29 MED ORDER — POTASSIUM CHLORIDE 2 MEQ/ML IV SOLN
80.0000 meq | INTRAVENOUS | Status: DC
  Filled 2021-09-29: qty 40

## 2021-09-29 MED ORDER — HEPARIN 6000 UNIT IRRIGATION SOLUTION
Status: AC
  Filled 2021-09-29: qty 500

## 2021-09-29 MED ORDER — FENTANYL 2500MCG IN NS 250ML (10MCG/ML) PREMIX INFUSION
0.0000 ug/h | INTRAVENOUS | Status: DC
  Administered 2021-09-30: 100 ug/h via INTRAVENOUS
  Administered 2021-10-01 – 2021-10-03 (×5): 200 ug/h via INTRAVENOUS
  Administered 2021-10-04: 300 ug/h via INTRAVENOUS
  Administered 2021-10-04 – 2021-10-05 (×3): 400 ug/h via INTRAVENOUS
  Filled 2021-09-29 (×12): qty 250

## 2021-09-29 MED ORDER — INSULIN REGULAR(HUMAN) IN NACL 100-0.9 UT/100ML-% IV SOLN
INTRAVENOUS | Status: DC
  Filled 2021-09-29: qty 100

## 2021-09-29 MED ORDER — SODIUM CHLORIDE 0.9 % IV SOLN: INTRAVENOUS | Status: DC

## 2021-09-29 MED ORDER — DOCUSATE SODIUM 50 MG/5ML PO LIQD
100.0000 mg | Freq: Two times a day (BID) | ORAL | Status: DC
  Administered 2021-09-30 – 2021-10-05 (×9): 100 mg
  Filled 2021-09-29 (×9): qty 10

## 2021-09-29 MED ORDER — EPINEPHRINE HCL 5 MG/250ML IV SOLN IN NS: 0.5000 ug/min | INTRAVENOUS | Status: DC

## 2021-09-29 MED ORDER — DEXTROSE 50 % IV SOLN: 0.0000 mL | INTRAVENOUS | Status: DC | PRN

## 2021-09-29 MED ORDER — IPRATROPIUM-ALBUTEROL 0.5-2.5 (3) MG/3ML IN SOLN
3.0000 mL | Freq: Four times a day (QID) | RESPIRATORY_TRACT | Status: DC | PRN
Start: 2021-09-29 — End: 2021-10-07

## 2021-09-29 MED ORDER — SODIUM CHLORIDE 0.45 % IV SOLN: INTRAVENOUS | Status: DC | PRN

## 2021-09-29 MED ORDER — MIDAZOLAM-SODIUM CHLORIDE 100-0.9 MG/100ML-% IV SOLN
INTRAVENOUS | Status: AC
  Filled 2021-09-29: qty 100

## 2021-09-29 MED ORDER — PLASMA-LYTE A IV SOLN
INTRAVENOUS | Status: DC
  Filled 2021-09-29: qty 2.5

## 2021-09-29 MED ORDER — AMIODARONE HCL IN DEXTROSE 360-4.14 MG/200ML-% IV SOLN
30.0000 mg/h | INTRAVENOUS | Status: DC
  Filled 2021-09-29: qty 200
  Filled 2021-09-29: qty 400

## 2021-09-29 MED ORDER — MIDAZOLAM HCL 2 MG/2ML IJ SOLN
2.0000 mg | INTRAMUSCULAR | Status: DC | PRN
  Administered 2021-10-08: 2 mg via INTRAVENOUS
  Filled 2021-09-29: qty 2

## 2021-09-29 MED ORDER — MAGNESIUM SULFATE 50 % IJ SOLN: 40.0000 meq | INTRAMUSCULAR | Status: DC

## 2021-09-29 MED ORDER — VANCOMYCIN HCL 1500 MG/300ML IV SOLN
1500.0000 mg | Freq: Once | INTRAVENOUS | Status: AC
  Administered 2021-09-29: 1500 mg via INTRAVENOUS
  Filled 2021-09-29: qty 300

## 2021-09-29 MED ORDER — DEXTROSE-NACL 5-0.45 % IV SOLN: INTRAVENOUS | Status: DC

## 2021-09-29 MED ORDER — TRANEXAMIC ACID (OHS) BOLUS VIA INFUSION
15.0000 mg/kg | INTRAVENOUS | Status: DC
  Filled 2021-09-29: qty 1530

## 2021-09-29 MED ORDER — EPINEPHRINE HCL 5 MG/250ML IV SOLN IN NS
INTRAVENOUS | Status: AC
  Administered 2021-09-29: 1 ug/min via INTRAVENOUS
  Filled 2021-09-29: qty 250

## 2021-09-29 MED ORDER — FENTANYL BOLUS VIA INFUSION
50.0000 ug | INTRAVENOUS | Status: DC | PRN
  Administered 2021-10-01 – 2021-10-05 (×4): 100 ug via INTRAVENOUS

## 2021-09-29 MED ORDER — HEPARIN 30,000 UNITS/1000 ML (OHS) CELLSAVER SOLUTION
Status: DC
  Filled 2021-09-29: qty 1000

## 2021-09-29 MED ORDER — SODIUM CHLORIDE 0.9 % IV SOLN
1.0000 g | Freq: Two times a day (BID) | INTRAVENOUS | Status: DC
  Administered 2021-09-29 – 2021-10-07 (×16): 1 g via INTRAVENOUS
  Filled 2021-09-29 (×18): qty 20

## 2021-09-29 MED ORDER — TRANEXAMIC ACID 1000 MG/10ML IV SOLN
1.5000 mg/kg/h | INTRAVENOUS | Status: DC
  Filled 2021-09-29: qty 25

## 2021-09-29 MED ORDER — DEXTROSE 50 % IV SOLN
INTRAVENOUS | Status: AC
  Administered 2021-09-29: 50 mL
  Filled 2021-09-29: qty 50

## 2021-09-29 MED ORDER — TRANEXAMIC ACID-NACL 1000-0.7 MG/100ML-% IV SOLN
1000.0000 mg | Freq: Once | INTRAVENOUS | Status: AC
  Administered 2021-09-29: 1000 mg via INTRAVENOUS
  Filled 2021-09-29: qty 100

## 2021-09-29 MED ORDER — MILRINONE LACTATE IN DEXTROSE 20-5 MG/100ML-% IV SOLN
0.3000 ug/kg/min | INTRAVENOUS | Status: DC
  Filled 2021-09-29: qty 100

## 2021-09-29 MED ORDER — CALCIUM GLUCONATE-NACL 2-0.675 GM/100ML-% IV SOLN
2.0000 g | Freq: Once | INTRAVENOUS | Status: AC
  Administered 2021-09-29: 2000 mg via INTRAVENOUS
  Filled 2021-09-29: qty 100

## 2021-09-29 MED ORDER — INSULIN REGULAR(HUMAN) IN NACL 100-0.9 UT/100ML-% IV SOLN
INTRAVENOUS | Status: DC
Start: 2021-09-29 — End: 2021-10-01
  Administered 2021-09-29: 5.5 [IU]/h via INTRAVENOUS
  Filled 2021-09-29: qty 100

## 2021-09-29 MED ORDER — ASPIRIN 325 MG PO TBEC: 325.0000 mg | DELAYED_RELEASE_TABLET | Freq: Two times a day (BID) | ORAL | Status: DC

## 2021-09-29 MED ORDER — POLYETHYLENE GLYCOL 3350 17 G PO PACK
17.0000 g | PACK | Freq: Every day | ORAL | Status: DC
  Administered 2021-09-30 – 2021-10-15 (×8): 17 g
  Filled 2021-09-29 (×8): qty 1

## 2021-09-29 MED ORDER — FENTANYL CITRATE PF 50 MCG/ML IJ SOSY: 50.0000 ug | PREFILLED_SYRINGE | Freq: Once | INTRAMUSCULAR | Status: DC

## 2021-09-29 MED ORDER — MIDAZOLAM HCL 2 MG/2ML IJ SOLN
2.0000 mg | INTRAMUSCULAR | Status: AC | PRN
  Administered 2021-09-30 – 2021-10-01 (×3): 2 mg via INTRAVENOUS

## 2021-09-29 MED ORDER — PROTHROMBIN COMPLEX CONC HUMAN 500 UNITS IV KIT
1622.0000 [IU] | PACK | Status: AC
  Administered 2021-09-30: 1622 [IU] via INTRAVENOUS
  Filled 2021-09-29: qty 622

## 2021-09-29 MED ORDER — FUROSEMIDE 10 MG/ML IJ SOLN
INTRAMUSCULAR | Status: AC
  Administered 2021-09-29: 40 mg
  Filled 2021-09-29: qty 4

## 2021-09-29 MED ORDER — EPINEPHRINE HCL 5 MG/250ML IV SOLN IN NS
0.0000 ug/min | INTRAVENOUS | Status: DC
  Filled 2021-09-29: qty 250

## 2021-09-29 MED ORDER — NOREPINEPHRINE 4 MG/250ML-% IV SOLN
0.0000 ug/min | INTRAVENOUS | Status: DC
  Administered 2021-09-30: 9 ug/min via INTRAVENOUS
  Filled 2021-09-29 (×3): qty 250

## 2021-09-29 MED ORDER — FENTANYL 2500MCG IN NS 250ML (10MCG/ML) PREMIX INFUSION
INTRAVENOUS | Status: AC
  Administered 2021-09-29: 100 ug/h via INTRAVENOUS
  Filled 2021-09-29: qty 250

## 2021-09-29 MED ORDER — TRANEXAMIC ACID (OHS) PUMP PRIME SOLUTION
2.0000 mg/kg | INTRAVENOUS | Status: DC
  Filled 2021-09-29: qty 2.04

## 2021-09-29 MED ORDER — VANCOMYCIN HCL 1000 MG IV SOLR
INTRAVENOUS | Status: AC
  Filled 2021-09-29: qty 20

## 2021-09-29 MED ORDER — ALBUMIN HUMAN 5 % IV SOLN
INTRAVENOUS | Status: AC
  Administered 2021-09-30: 12.5 g
  Filled 2021-09-29: qty 500

## 2021-09-29 MED ORDER — LIDOCAINE HCL (PF) 1 % IJ SOLN
INTRAMUSCULAR | Status: AC
  Administered 2021-09-29: 30 mL
  Filled 2021-09-29: qty 30

## 2021-09-29 SURGICAL SUPPLY — 41 items
APL PRP STRL LF DISP 70% ISPRP (MISCELLANEOUS) ×3
BNDG GAUZE DERMACEA FLUFF (GAUZE/BANDAGES/DRESSINGS) ×1
BNDG GAUZE DERMACEA FLUFF 4 (GAUZE/BANDAGES/DRESSINGS) IMPLANT
BNDG GZE DERMACEA 4 6PLY (GAUZE/BANDAGES/DRESSINGS) ×1
CABLE PACING FASLOC BIEGE (MISCELLANEOUS) IMPLANT
CANNULA NON VENT 22FR 12 (CANNULA) IMPLANT
CHLORAPREP W/TINT 26 (MISCELLANEOUS) IMPLANT
CONNECTOR STRAIGHT 3/8 (MISCELLANEOUS) IMPLANT
COUNTER NEEDLE 20 DBL MAG RED (NEEDLE) IMPLANT
COVER BACK TABLE 80X110 HD (DRAPES) IMPLANT
DRAPE INCISE IOBAN 66X45 STRL (DRAPES) IMPLANT
DRAPE PERI GROIN 82X75IN TIB (DRAPES) IMPLANT
ELECT BLADE 4.0 EZ CLEAN MEGAD (MISCELLANEOUS) ×2
ELECT REM PT RETURN 9FT ADLT (ELECTROSURGICAL) ×1
ELECTRODE BLDE 4.0 EZ CLN MEGD (MISCELLANEOUS) IMPLANT
ELECTRODE REM PT RTRN 9FT ADLT (ELECTROSURGICAL) IMPLANT
FELT TEFLON 1X6 (MISCELLANEOUS) IMPLANT
FEMORAL VENOUS CANN RAP (CANNULA) IMPLANT
GAUZE 4X4 16PLY ~~LOC~~+RFID DBL (SPONGE) IMPLANT
GLOVE BIOGEL PI IND STRL 6.5 (GLOVE) IMPLANT
GLOVE BIOGEL PI INDICATOR 6.5 (GLOVE) ×1
KIT DILATOR VASC 18G NDL (KITS) IMPLANT
Kerlix gauze bandage IMPLANT
LEAD PACING MYOCARDI (MISCELLANEOUS) IMPLANT
MARKER SKIN DUAL TIP RULER LAB (MISCELLANEOUS) IMPLANT
PACK BASIC III (CUSTOM PROCEDURE TRAY) ×1
PACK SRG BSC III STRL LF ECLPS (CUSTOM PROCEDURE TRAY) IMPLANT
PENCIL BUTTON HOLSTER BLD 10FT (ELECTRODE) IMPLANT
SPONGE T-LAP 18X18 ~~LOC~~+RFID (SPONGE) IMPLANT
SUT ETHIBOND X763 2 0 SH 1 (SUTURE) IMPLANT
SUT PROLENE 4 0 SH DA (SUTURE) IMPLANT
SUT SILK  1 MH (SUTURE) ×3
SUT SILK 1 MH (SUTURE) IMPLANT
SYR 20CC LL (SYRINGE) IMPLANT
SYR BULB IRRIG 60ML STRL (SYRINGE) IMPLANT
TUBE CONNECTING 12X1/4 (SUCTIONS) IMPLANT
TUBING MEDICAL 3X8X3X32 (MISCELLANEOUS) IMPLANT
WIRE EMERALD 3MM-J .035X150CM (WIRE) IMPLANT
WIRE HI TORQ VERSACORE-J 145CM (WIRE) IMPLANT
WIRE ROSEN-J .035X260CM (WIRE) IMPLANT
YANKAUER SUCT BULB TIP NO VENT (SUCTIONS) IMPLANT

## 2021-09-29 NOTE — Progress Notes (Signed)
ECMO INITIATION   Patient: [name], Dec 05, 1960, 61 y.o. Location:   Date of Service:  09/29/2021     Time: 4:33 PM  Date of Admission: 09/28/2021 Admitting diagnosis: S/P pericardial window creation  Ht: 5\' 7"  (170.2 cm) Wt: 102 kg BSA: Body surface area is 2.2 meters squared.  Blood Type: O POS Allergies: No Known Allergies  Past medical history:  Past Medical History:  Diagnosis Date   AKI (acute kidney injury) ([place])    pt unaware of this   Anginal pain ([place])    CAD (coronary artery disease)    a. 03/2015 NSTEMI: LHC with severe 3V CAD  (70% mid RCA, 95% OM1, 90% distal LCx, 90% OM3, 80% prox LAD and 90% ost D1) s/p DES to mLAD w/ small dissction Rx with DES, staged ost Ramus PCI/DES and dLCx s/p PCI/DES    Chest pain 12/24/2020   Diabetes mellitus type 2 in obese [facility])    Diverticulosis    Dyspnea    Dyspnea on exertion 03/16/2015   Dyspnea on exertion   Family history of adverse reaction to anesthesia    patient father- pt states after anesthesia his father "developed dementia"   GERD (gastroesophageal reflux disease)    Hypercholesteremia    Hypertension associated with diabetes ([place]) 03/16/2015   hypertension   NSTEMI (non-ST elevated myocardial infarction) ([place]) 03/17/2015   Obesity    Stroke ([place]) 2022   pt states he had a "mini stroke" during cardiac catheterization   Tobacco abuse    Past surgical history:  Past Surgical History:  Procedure Laterality Date   CARDIAC CATHETERIZATION N/A 03/19/2015   Procedure: Left Heart Cath and Coronary Angiography;  Surgeon: Lorretta Harp, MD;  Location: [place] [facility];  Service: Cardiovascular;  Laterality: N/A;   CARDIAC CATHETERIZATION N/A 03/20/2015   Procedure: Coronary Stent Intervention;  Surgeon: Lorretta Harp, MD;  Location: [place] [facility];  Service: Cardiovascular;  Laterality: N/A;   CARDIAC CATHETERIZATION N/A 03/22/2015   Procedure: Coronary Stent Intervention;  Surgeon: Lorretta Harp, MD;   Location: [place] [facility];  Service: Cardiovascular;  Laterality: N/A;   CORONARY ARTERY BYPASS GRAFT N/A 09/12/2021   Procedure: CORONARY ARTERY BYPASS GRAFTING (CABG) X 5 USING LEFT INTERNAL MAMMARY ARTERY AND ENDOSCOPICALLY HARVESTED RIGHT GREATER SAPHENOUS VEIN.;  Surgeon: [name], MD;  Location: [place];  Service: Open Heart Surgery;  Laterality: N/A;   LEFT HEART CATH AND CORONARY ANGIOGRAPHY N/A 12/25/2020   Procedure: LEFT HEART CATH AND CORONARY ANGIOGRAPHY;  Surgeon: Troy Sine, MD;  Location: [place] [facility];  Service: Cardiovascular;  Laterality: N/A;   LEFT HEART CATH AND CORONARY ANGIOGRAPHY N/A 12/26/2020   Procedure: LEFT HEART CATH AND CORONARY ANGIOGRAPHY;  Surgeon: Troy Sine, MD;  Location: [place] [facility];  Service: Cardiovascular;  Laterality: N/A;   TEE WITHOUT CARDIOVERSION N/A 09/12/2021   Procedure: TRANSESOPHAGEAL ECHOCARDIOGRAM (TEE);  Surgeon: [name], MD;  Location: [place];  Service: Open Heart Surgery;  Laterality: N/A;   TESTICLE SURGERY  1970   pt states testicle was ascended and had to be "pulled down"   TONSILLECTOMY     as a child    Indication for ECMO: Cardiogenic Shock  ECMO was deployed at 1410 and initiated at 1515  Anticoagulation achieved with Heparin bolus of 7000 units given to patient at 1453. Cannulated for VA  and achieved initial ECMO 4.34LPMand ECMO 2 lpm sweep.    ECMO Cannula  Information     Staff Present  Primary Perfusionist Martinique Castillejo   Assisting Perfusionist/ECMO Specialist Idelia Salm RN  Cannulating Physician Lightfoot MD   ECMO Lot Numbers  CardioHelp Console  [id number]  Oxygenator  [id number]  Tubing Pack  [id number]  ECMO Goals  Flow goal   >3.6LPM   Anticoagulation goal      Cardiac goal   MAP > 65  Respiratory goal   SAT> 88%  Other goal       ECMO Handoff  Patient Information * Age Height Weight BSA IBW BMI  61 y.o. 5\' 7"  (170.2 cm)  (102 kg Body surface area is 2.2  meters squared. No data recorded Body mass index is 35.22 kg/m.   Review History * Primary Diagnosis   S/P pericardial window creation  Prior Cardiac Arrest within 24hrs of ECMO initiation?   ECMO and MCS * Type ECMO Flow ECMO Sweep Gases   VA    3.80    3     Additional Mechanical Support   Ventilation *    Vent Mode: PRVC, Vt Set: 520 mL, Set Rate: (S) 24 bmp (changed by CCM), FiO2 (%): 50 % (titrated post ABG results.), PEEP: 10 cmH20     Cannula Size and Locations       Drainage 23/25 Fr. RVF   Return 22 Fr. Ao    *Cannula(e) sutured and anchored, secured and dressed.   Labs and Imaging *  *Cannulation position verified via imaging on arrival to ICU. Concerns communicated to attending surgeon. Labs reviewed.   All ECMO safety checks complete. ECMO flowsheet initiated, applicable charges captured, LDA's entered/confirmed, imaging and labs verified, blood products available, and report given to [employer].

## 2021-09-29 NOTE — Progress Notes (Signed)
   09/29/21 1328  Clinical Encounter Type  Visited With Patient not available;Health care provider  Visit Type Code;Critical Care;Initial;Spiritual support  Referral From Physician;Nurse (PHYSICIANS: Dr. Glori Bickers, MD; Dr. Lajuana Matte, MD; NURSE: Laban Emperor, RN)  Consult/Referral To Chaplain Melvenia Beam)  Recommendations CODE BLUE   13:28: Anza paged for CODE BLUE. 13:33: Upon arrival to room medical team performing Advanced Life Support Resuscitation efforts on [name]. [name]. - Patient is divorced, with no children, no siblings, parents are deceased, only living family are uncles: [name] [phone] and [name] [phone] (Patient and Uncles all in same house hold). Provide Staff Support for Medical Team.  [name] will remain available as needed. [address] [place], [name], [phone]

## 2021-09-29 NOTE — Progress Notes (Signed)
Called MD to Notify him about patient have episodes of bradycardia along with not able to maintain his O2 saturation had to put him on a non rebreather and his sats in the 89's, ran ABG's and order a Stat chest x-ray. Told him the results.  MD said to give him 40 mg of IV Lasix.

## 2021-09-29 NOTE — CV Procedure (Signed)
Central Venous Catheter Insertion Procedure Note [name] [id number] October 23, 1960    Procedure: Insertion of Central Venous Catheter Indications: Drug and/or fluid administration   Procedure Details Consent: Emergent consent Time Out: Verified patient identification, verified procedure, site/side was marked, verified correct patient position, special equipment/implants available, medications/allergies/relevent history reviewed, required imaging and test results available.  Performed   Maximum sterile technique was used including antiseptics, cap, gloves, gown, hand hygiene, mask and sheet. Skin prep: Chlorhexidine; local anesthetic administered A antimicrobial bonded/coated triple lumen catheter was placed in the left internal jugular vein using the Seldinger technique and u/s guidance.   Evaluation Blood flow good Complications: No apparent complications Patient did tolerate procedure well. Chest X-ray ordered to verify placement.  CXR: Catheter in SVC. No PTX   Glori Bickers, MD  6:15 PM

## 2021-09-29 NOTE — Op Note (Signed)
      [address]       [place] [address]             [phone]                                          09/29/2021 Patient:  [name] Pre-Op Dx: Open Chest Cardiogenic shock    Post-op Dx:  same Procedure: Mediastinal re-exploration  VA ECMO cannulation.  27F Aortic, 23-10F L femoral vein  Surgeon and Role:      * Nickey Kloepfer, Lucile Crater, MD - Primary  Anesthesia  general EBL:  per anesthesia records Blood Administration: per anesthesia records   Drains: 27 F blake drain X 2 mediastinal   Counts: correct   Indications: 61yo male with open chest arrested in the ICU 2/2 bradycardia and hypoxia.  The decision was made to proceed with VA ECMO cannulation.  Findings: Clot along the lateral wall.  Good LV and RV function  Operative Technique: The patient was brought to the operative theatre.  Anesthesia was induced, and the patient was prepped and draped in normal sterile fashion.  An appropriate surgical pause was performed, and pre-operative antibiotics were dosed accordingly.  Dressings were removed, and the sternal retractor was then placed.  Purse-string sutures were placed in the ascending aorta.  Using TEE guidance, a stiff wire was passed from the R femoral vein sheath into the SVC.  The vein was serially dilated, and the cannula was then advanced, and connected to the ECMO circuit.  The aortic cannula was then introduced into the ascending aorta and connected to the circuit.  ECMO support was initiated with good flows.     Chest tubes were placed, and the mediastinum was loosely packed with kerlex gauze.  An Charlie Pitter was then used to cover the chest.  The patient tolerated the procedure without any immediate complications, and was transferred to the ICU in guarded condition.  [name]

## 2021-09-29 NOTE — Progress Notes (Signed)
Pt connected to [place] ECMO and initiated at 15:15. All pump parameters within range. Flowing 4LPM and Hgb 7. Transported from [place] to [place] at 16:12 without incident.

## 2021-09-29 NOTE — Anesthesia Preprocedure Evaluation (Signed)
Anesthesia Evaluation  Patient identified by MRN, date of birth, ID band Patient unresponsive    Reviewed: Allergy & Precautions, NPO status , Patient's Chart, lab work & pertinent test resultsPreop documentation limited or incomplete due to emergent nature of procedure.  Airway Mallampati: Intubated  TM Distance: >3 FB Neck ROM: Full    Dental   Pulmonary Patient abstained from smoking., former smoker,  Intubated      + intubated    Cardiovascular hypertension, Pt. on medications and Pt. on home beta blockers + angina + CAD, + Past MI and + Cardiac Stents   Rhythm:Regular Rate:Tachycardia     Neuro/Psych CVA, Residual Symptoms    GI/Hepatic   Endo/Other  diabetes, Insulin Dependent  Renal/GU ARFRenal disease     Musculoskeletal   Abdominal (+) + obese,   Peds  Hematology  (+) Blood dyscrasia, anemia ,   Anesthesia Other Findings CAD  Reproductive/Obstetrics                             Anesthesia Physical  Anesthesia Plan  ASA: 5 and emergent  Anesthesia Plan: General   Post-op Pain Management:    Induction: Intravenous  PONV Risk Score and Plan: 2 and Treatment may vary due to age or medical condition  Airway Management Planned: Oral ETT  Additional Equipment: TEE  Intra-op Plan:   Post-operative Plan: Post-operative intubation/ventilation  Informed Consent:     Only emergency history available  Plan Discussed with: CRNA  Anesthesia Plan Comments:         Anesthesia Quick Evaluation

## 2021-09-29 NOTE — Progress Notes (Signed)
Progress Note  Patient Name: [name] Date of Encounter: 09/29/2021  [facility] Cardiologist: Freada Bergeron, MD   Subjective   Dyspnea has resolved and mental status is greatly improved since evacuation of pericardial hematoma. Unfortunately having a lot of problems with arrhythmia, alternating between atrial fibrillation rapid ventricular response and sinus bradycardia.  Also had a couple of brief episodes of nonsustained ventricular tachycardia but one of them lasted for 20 seconds.  On IV amiodarone.  He is largely unaware of the arrhythmia, including frequent back-and-forth changes between rapid A-fib and sinus bradycardia while we were talking this morning. Urine output is poor. Chest tube output roughly 400 mL since surgery.  Inpatient Medications    Scheduled Meds:  amiodarone  200 mg Oral Daily   aspirin EC  325 mg Oral Daily   Or   aspirin  324 mg Per Tube Daily   atorvastatin  80 mg Oral Daily   bisacodyl  10 mg Oral Daily   Or   bisacodyl  10 mg Rectal Daily   Chlorhexidine Gluconate Cloth  6 each Topical Daily   docusate sodium  200 mg Oral Daily   heparin  5,000 Units Subcutaneous Q8H   lidocaine (PF)       [START ON 09/30/2021] pantoprazole  40 mg Oral Daily   sodium chloride flush  3 mL Intravenous Q12H   Continuous Infusions:  sodium chloride     amiodarone 30 mg/hr (09/29/21 0800)    ceFAZolin (ANCEF) IV Stopped (09/29/21 0129)   PRN Meds: acetaminophen, lidocaine (PF), metoprolol tartrate, nitroGLYCERIN, ondansetron (ZOFRAN) IV, oxyCODONE, sodium chloride flush, traMADol   Vital Signs    Vitals:   09/29/21 0600 09/29/21 0700 09/29/21 0800 09/29/21 0900  BP:  113/76    Pulse: (!) 52 (!) 52 (!) 52 (!) 54  Resp: 19 (!) 29 (!) 22 (!) 26  Temp:      TempSrc:      SpO2: 90% 92% (!) 86% (!) 88%  Weight:      Height:        Intake/Output Summary (Last 24 hours) at 09/29/2021 0938 Last data filed at 09/29/2021 0800 Gross per 24 hour   Intake 1510 ml  Output 570 ml  Net 940 ml      09/29/2021    5:00 AM 09/28/2021    4:30 AM 09/20/2021    5:53 AM  Last 3 Weights  Weight (lbs) 224 lb 13.9 oz 222 lb 10.6 oz 222 lb 10.6 oz  Weight (kg) 102 kg 101 kg 101 kg      Telemetry    Atrial fibrillation rapid ventricular response around 130 bpm, alternating with sinus bradycardia around 50 bpm.  Occasional postconversion pauses, none over 2 seconds in duration; occasional nonsustained VT only 1 episode lengthy at about 20 seconds.- Personally Reviewed  ECG    Sinus bradycardia, diffuse ST segment elevation consistent with acute pericarditis- Personally Reviewed  Physical Exam  Alert, looks comfortable GEN: No acute distress.   Neck: No JVD Cardiac: RRR, no murmurs, rubs, or gallops.  Respiratory: Clear to auscultation bilaterally. GI: Soft, nontender, non-distended  MS: No edema; No deformity. Neuro:  Nonfocal  Psych: Normal affect   Labs    High Sensitivity Troponin:   Recent Labs  Lab 09/28/21 0435 09/28/21 0640  TROPONINIHS 46* 52*     Chemistry Recent Labs  Lab 09/28/21 0435 09/28/21 1412 09/29/21 0359 09/29/21 0430  NA 135 134* 132* 132*  K 4.2 4.8  4.5 4.5  CL 102  --  101  --   CO2 22  --  21*  --   GLUCOSE 147*  --  64*  --   BUN 50*  --  52*  --   CREATININE 2.35*  --  2.72*  --   CALCIUM 8.6*  --  8.2*  --   MG  --   --  2.4  --   PROT 6.2*  --   --   --   ALBUMIN 2.9*  --   --   --   AST 39  --   --   --   ALT 51*  --   --   --   ALKPHOS 117  --   --   --   BILITOT 1.3*  --   --   --   GFRNONAA 31*  --  26*  --   ANIONGAP 11  --  10  --     Lipids No results for input(s): "CHOL", "TRIG", "HDL", "LABVLDL", "LDLCALC", "CHOLHDL" in the last 168 hours.  Hematology Recent Labs  Lab 09/28/21 0435 09/28/21 1412 09/28/21 1605 09/29/21 0359 09/29/21 0430  WBC 17.2*  --  15.4* 20.3*  --   RBC 3.39*  --  3.24* 3.33*  --   HGB 9.9*   < > 9.4* 9.7* 9.9*  HCT 31.8*   < > 28.8* 29.9*  29.0*  MCV 93.8  --  88.9 89.8  --   MCH 29.2  --  29.0 29.1  --   MCHC 31.1  --  32.6 32.4  --   RDW 16.0*  --  16.0* 15.9*  --   PLT 513*  --  619* 586*  --    < > = values in this interval not displayed.   Thyroid No results for input(s): "TSH", "FREET4" in the last 168 hours.  BNP Recent Labs  Lab 09/28/21 0405  BNP 826.6*    DDimer No results for input(s): "DDIMER" in the last 168 hours.   Radiology    DG Chest Port 1 View  Result Date: 09/29/2021 CLINICAL DATA:  Pneumothorax. EXAM: PORTABLE CHEST 1 VIEW COMPARISON:  09/28/2021. FINDINGS: Heart is enlarged the mediastinal contour is stable. Sternotomy wires are present over the midline. There is atherosclerotic calcification of the aorta. A right internal jugular central venous catheter terminates over the superior vena cava. There is opacification of the left lung base, similar in appearance to the prior exam. A small pleural effusion is present on the right. No pneumothorax. No acute osseous abnormality. IMPRESSION: 1. No pneumothorax. 2. Small right pleural effusion. 3. Moderate pleural effusion on the left with atelectasis or infiltrate. 4. Cardiomegaly. Electronically Signed   By: Brett Fairy M.D.   On: 09/29/2021 04:54   DG Chest Port 1 View  Result Date: 09/28/2021 CLINICAL DATA:  Chest pain EXAM: PORTABLE CHEST 1 VIEW COMPARISON:  Previous studies including the examination of 09/28/2021 FINDINGS: Transverse diameter of heart is increased. There is previous median sternotomy. Interval placement of right IJ chest port with its tip in superior vena cava. There is increased density in both lower lung fields, more so on the left side. There is no pneumothorax. IMPRESSION: Cardiomegaly. Increased density is seen in both lower lung fields, more so on the left side suggesting bilateral pleural effusions, more so on the left side. Part of this finding may suggest underlying atelectasis/pneumonia. Tip of right IJ chest port is seen in  superior vena  cava. There is no pneumothorax. Electronically Signed   By: Elmer Picker M.D.   On: 09/28/2021 16:13   ECHOCARDIOGRAM COMPLETE  Result Date: 09/28/2021    ECHOCARDIOGRAM REPORT   Patient Name:   [name] Date of Exam: 09/28/2021 Medical Rec #:  [id number]    Height:       67.0 in Accession #:    [id number]   Weight:       222.7 lb Date of Birth:  [birth year]    BSA:          2.117 m Patient Age:    24 years     BP:           103/78 mmHg Patient Gender: M            HR:           57 bpm. Exam Location:  Inpatient Procedure: 2D Echo, Cardiac Doppler and Color Doppler              STAT ECHO  Dr. Dani Gobble Jakob Kimberlin bedside for echo. Indications:    Tamponade  History:        Patient has prior history of Echocardiogram examinations, most                 recent 09/12/2021. CAD, Prior CABG; Risk Factors:Diabetes. CABG                 09/12/21. CKD. DOE.  Sonographer:    Clayton Lefort RDCS (AE) Referring Phys: Alphonsa Overall MCDANIEL  Sonographer Comments: Patient is obese. Image acquisition challenging due to patient body habitus. IMPRESSIONS  1. Technically difficult study, limited visualization. Poorly visualized LV, difficult LVEF assessment. LVEF roughly 50%. Left ventricular ejection fraction, by estimation, is 50%. The left ventricle has low normal function. Left ventricular endocardial  border not optimally defined to evaluate regional wall motion. There is mild left ventricular hypertrophy. Left ventricular diastolic parameters are consistent with Grade II diastolic dysfunction (pseudonormalization). Elevated left atrial pressure.  2. Right ventricular systolic function was not well visualized. The right ventricular size is not well visualized. Tricuspid regurgitation signal is inadequate for assessing PA pressure.  3. Moderate primarily left sided effusion. Difficult inflow assessment, inconsistent variable E/A pattern perhaps due to tachypnea. Does appear to be inflow variation across the MV of 45%.  Tricuspid inflow not interpretable. RA and RV poorly visualized to assess for collapse. IVC is dilated. There is evidence of intermittent ventricular septal shift. Essentially some evidence of possible tamponade but limited assessment due to very limited visualization.     . Moderate pericardial effusion. The pericardial effusion is circumferential.  4. The mitral valve was not well visualized. No evidence of mitral valve regurgitation. No evidence of mitral stenosis.  5. The aortic valve was not well visualized. There is mild calcification of the aortic valve. There is mild thickening of the aortic valve. Aortic valve regurgitation is mild. No aortic stenosis is present. FINDINGS  Left Ventricle: Technically difficult study, limited visualization. Poorly visualized LV, difficult LVEF assessment. LVEF roughly 50%. Left ventricular ejection fraction, by estimation, is 50%. The left ventricle has low normal function. Left ventricular endocardial border not optimally defined to evaluate regional wall motion. The left ventricular internal cavity size was normal in size. There is mild left ventricular hypertrophy. Left ventricular diastolic parameters are consistent with Grade II diastolic dysfunction (pseudonormalization). Elevated left atrial pressure. Right Ventricle: The right ventricular size is not well visualized. Right vetricular wall thickness was not  well visualized. Right ventricular systolic function was not well visualized. Tricuspid regurgitation signal is inadequate for assessing PA pressure. Left Atrium: Left atrial size was not well visualized. Right Atrium: Right atrial size was not well visualized. Pericardium: Moderate primarily left sided effusion. Difficult inflow assessment, inconsistent variable E/A pattern perhaps due to tachypnea. Does appear to be inflow variation across the MV of 45%. Tricuspid inflow not interpretable. RA and RV poorly visualized to assess for collapse. IVC is dilated. There  is evidence of intermittent ventricular septal shift. Essentially some evidence of possible tamponade but limited assessment due to very limited visualization. A moderately sized pericardial effusion is present. The pericardial effusion is circumferential. Mitral Valve: The mitral valve was not well visualized. There is mild thickening of the mitral valve leaflet(s). There is mild calcification of the mitral valve leaflet(s). Mild mitral annular calcification. No evidence of mitral valve regurgitation. No evidence of mitral valve stenosis. Tricuspid Valve: The tricuspid valve is not well visualized. Tricuspid valve regurgitation is not demonstrated. No evidence of tricuspid stenosis. Aortic Valve: The aortic valve was not well visualized. There is mild calcification of the aortic valve. There is mild thickening of the aortic valve. There is mild aortic valve annular calcification. Aortic valve regurgitation is mild. Aortic regurgitation PHT measures 543 msec. No aortic stenosis is present. Aortic valve mean gradient measures 3.0 mmHg. Aortic valve peak gradient measures 6.0 mmHg. Aortic valve area, by VTI measures 3.36 cm. Pulmonic Valve: The pulmonic valve was not well visualized. Pulmonic valve regurgitation is not visualized. No evidence of pulmonic stenosis. Aorta: The aortic root is normal in size and structure. IAS/Shunts: The interatrial septum was not well visualized.  LEFT VENTRICLE PLAX 2D LVIDd:         5.20 cm      Diastology LVIDs:         3.80 cm      LV e' medial:    4.24 cm/s LV PW:         1.20 cm      LV E/e' medial:  20.6 LV IVS:        1.20 cm      LV e' lateral:   5.11 cm/s LVOT diam:     2.50 cm      LV E/e' lateral: 17.1 LV SV:         68 LV SV Index:   32 LVOT Area:     4.91 cm  LV Volumes (MOD) LV vol d, MOD A2C: 160.0 ml LV vol d, MOD A4C: 142.0 ml LV vol s, MOD A2C: 87.1 ml LV vol s, MOD A4C: 67.9 ml LV SV MOD A2C:     72.9 ml LV SV MOD A4C:     142.0 ml LV SV MOD BP:      73.0 ml RIGHT  VENTRICLE            IVC RV S prime:     6.09 cm/s  IVC diam: 2.60 cm LEFT ATRIUM           Index        RIGHT ATRIUM           Index LA diam:      3.30 cm 1.56 cm/m   RA Area:     18.80 cm LA Vol (A4C): 63.6 ml 30.05 ml/m  RA Volume:   48.60 ml  22.96 ml/m  AORTIC VALVE AV Area (Vmax):    3.01 cm AV Area (Vmean):   2.68  cm AV Area (VTI):     3.36 cm AV Vmax:           122.00 cm/s AV Vmean:          79.700 cm/s AV VTI:            0.203 m AV Peak Grad:      6.0 mmHg AV Mean Grad:      3.0 mmHg LVOT Vmax:         74.70 cm/s LVOT Vmean:        43.500 cm/s LVOT VTI:          0.139 m LVOT/AV VTI ratio: 0.68 AI PHT:            543 msec  AORTA Ao Root diam: 3.30 cm MITRAL VALVE MV Area (PHT): 3.06 cm    SHUNTS MV Decel Time: 248 msec    Systemic VTI:  0.14 m MV E velocity: 87.40 cm/s  Systemic Diam: 2.50 cm MV A velocity: 75.10 cm/s MV E/A ratio:  1.16 Carlyle Dolly MD Electronically signed by Carlyle Dolly MD Signature Date/Time: 09/28/2021/12:51:25 PM    Final    CT CHEST WO CONTRAST  Result Date: 09/28/2021 CLINICAL DATA:  Pleural effusion, history of CABG. EXAM: CT CHEST WITHOUT CONTRAST TECHNIQUE: Multidetector CT imaging of the chest was performed following the standard protocol without IV contrast. RADIATION DOSE REDUCTION: This exam was performed according to the departmental dose-optimization program which includes automated exposure control, adjustment of the mA and/or kV according to patient size and/or use of iterative reconstruction technique. COMPARISON:  Chest x-ray 09/14/2021, 09/28/2021 FINDINGS: Cardiovascular: No acute vascular findings. Stable cardiomegaly. Small pericardial effusion measuring 2 cm in thickness over the left ventricle. Prior CABG. Coronary artery atherosclerosis. Thoracic aortic atherosclerosis. Mediastinum/Nodes: No hilar, mediastinal or axillary lymphadenopathy. Trachea and esophagus are unremarkable. Heterogeneously enlarged right thyroid gland with multiple small  hypodensities scattered within the right thyroid gland which has been previously characterized on thyroid ultrasound dated 03/12/2021. 1.5 cm left inferior thyroid nodule. On the ultrasound dated 03/12/2021 a FNA was recommended. Lungs/Pleura: Small bilateral pleural effusions. Bibasilar atelectasis, left greater than right. No pneumothorax. Upper Abdomen: No acute upper abdominal abnormality. Musculoskeletal: No acute osseous abnormality. No aggressive osseous lesion. IMPRESSION: 1. Small bilateral pleural effusions with bibasilar atelectasis, left greater than right. 2. Cardiomegaly with small pericardial effusion. 3. Right thyromegaly which was previously characterized on a thyroid ultrasound dated 03/12/2021. The thyroid ultrasound dated 03/12/2021 recommended FNA of a nodule in the right and left thyroid gland. Please refer to the thyroid ultrasound dated 03/12/2021 for further detail. 4. Aortic Atherosclerosis (ICD10-I70.0). Electronically Signed   By: Kathreen Devoid M.D.   On: 09/28/2021 11:00   DG Chest Port 1 View  Result Date: 09/28/2021 CLINICAL DATA:  Shortness of breath. EXAM: PORTABLE CHEST 1 VIEW COMPARISON:  06/29/2021 FINDINGS: The cardio pericardial silhouette is enlarged. Interval improvement in aeration at the right lung base. Retrocardiac left base opacity persists, likely atelectasis although superimposed infection cannot be excluded. Pleural effusions seen previously not readily evident today. Telemetry leads overlie the chest. IMPRESSION: 1. Interval improvement in aeration at the right lung base. 2. Persistent retrocardiac left base opacity, likely atelectasis. 3. Small pleural effusions seen previously not evident today. Electronically Signed   By: Misty Stanley M.D.   On: 09/28/2021 04:59    Cardiac Studies   See echo above.  Patient Profile     61 y.o. male with recent CABG (LIMA to LAD, SVG to  diagonal, SVG to distal RCA, sequential SVG to OM1-OM 2; 09/12/2021, [place]), stopped  of atrial fibrillation, returning roughly 2 weeks later with dyspnea and echo findings consistent with pericardial tamponade (probably effusive-constrictive mechanism with a large amount of thrombus) and AKI, status post surgical evacuation of pericardial hematoma 09/28/2021.  Recurrent paroxysmal atrial fibrillation alternating with sinus bradycardia postop.  Assessment & Plan    Continue intravenous amiodarone.  Avoid higher doses of AV node blocking agents due to periods of sinus bradycardia.  He does not have pacemaker backup.  Hopefully arrhythmia will subside as pericardial inflammation decreases. Worsening renal function and mediocre urine output are concerning for acute tubular necrosis, possibly due to diminished cardiac output prior to admission. Consider repeat echocardiogram. Not sure that inotropes would be beneficial at this point, may simply worsen issues related to arrhythmia.  Will consult with heart failure service.     For questions or updates, please contact [place] Please consult [email or web address] for contact info under        Signed, Sanda Klein, MD  09/29/2021, 9:38 AM

## 2021-09-29 NOTE — Procedures (Signed)
Central Venous Catheter Insertion Procedure Note  [name]  [id number]  Nov 15, 1960  Date:09/29/21  Time:2:13 PM   Provider Performing:Daxon Kyne B Krishiv Sandler   Procedure: Insertion of Non-tunneled Central Venous Catheter(36556) with US guidance (53614)   Indication(s) Medication administration  Consent Unable to obtain consent due to emergent nature of procedure.  Anesthesia Topical only with 1% lidocaine   Timeout Verified patient identification, verified procedure, site/side was marked, verified correct patient position, special equipment/implants available, medications/allergies/relevant history reviewed, required imaging and test results available.  Sterile Technique Maximal sterile technique including full sterile barrier drape, hand hygiene, sterile gown, sterile gloves, mask, hair covering, sterile ultrasound probe cover (if used).  Procedure Description Area of catheter insertion was cleaned with chlorhexidine and draped in sterile fashion.  With real-time ultrasound guidance a introducer sheath was placed into the right femoral vein. Nonpulsatile blood flow and easy flushing noted in all ports.  The catheter was sutured in place and sterile dressing applied.  Complications/Tolerance None; patient tolerated the procedure well. Chest X-ray is ordered to verify placement for internal jugular or subclavian cannulation.   Chest x-ray is not ordered for femoral cannulation.  EBL Minimal  Specimen(s) None

## 2021-09-29 NOTE — Consult Note (Signed)
NAME:  [name], MRN:  [id number], DOB:  [birth year], LOS: 1 ADMISSION DATE:  09/28/2021, CONSULTATION DATE:  09/29/21 REFERRING MD:  Dr. Kipp Brood, CHIEF COMPLAINT:  Cardiac Arrest   History of Present Illness:  [name] is a 61 year old male with DMII, , CVA, atrial fibrillation on eliquis and multivessel CAD s/p 5 vessel CABG on 09/12/21 who presented 8/19 with shortness of breath via EMS. He was found to be bradycardic and hypoglycemic. He symptomatically improved with dextrose but continued to have bradycardia and soft blood pressures. ECHO obtained which showed LVEF 50%, grade II diastolic dysfunction and moderate pericardial effusion with possible tamponade physiology. CT Chest showed pericardial effusion and small bilateral pleural effusions. He was taken to the OR 8/19 for pericardial window.   Patient started having issues with tachy-brady arrhythmias on telemetry, decreased urine output and increased work of breathing over night. A 14 french pigtail catheter was placed in the left pleural space this morning with bloody drainage. At 10:35am patient became unresponsive with PEA arrest. He underwent multiple rounds of CPR with epinephrine pushes. ROSC was obtained and patient was intubated. Bedside US by cardiology showed large pericardial effusion with tamponade physiology. He lost pulse again and CPR was again performed. Please see code sheet for full details. Bedside sternotomy performed with pericardial drainage by CT surgery, immediate improvement in hemodynamics achieved with drainage of the pericardial space.   Pertinent  Medical History   Past Medical History:  Diagnosis Date   AKI (acute kidney injury) ([place])    pt unaware of this   Anginal pain ([place])    CAD (coronary artery disease)    a. 03/2015 NSTEMI: LHC with severe 3V CAD  (70% mid RCA, 95% OM1, 90% distal LCx, 90% OM3, 80% prox LAD and 90% ost D1) s/p DES to mLAD w/ small dissction Rx with DES, staged ost Ramus PCI/DES and  dLCx s/p PCI/DES    Chest pain 12/24/2020   Diabetes mellitus type 2 in obese [facility])    Diverticulosis    Dyspnea    Dyspnea on exertion 03/16/2015   Dyspnea on exertion   Family history of adverse reaction to anesthesia    patient father- pt states after anesthesia his father "developed dementia"   GERD (gastroesophageal reflux disease)    Hypercholesteremia    Hypertension associated with diabetes ([place]) 03/16/2015   hypertension   NSTEMI (non-ST elevated myocardial infarction) ([place]) 03/17/2015   Obesity    Stroke ([place]) 2022   pt states he had a "mini stroke" during cardiac catheterization   Tobacco abuse      Significant Hospital Events: Including procedures, antibiotic start and stop dates in addition to other pertinent events   8/19 admitted, s/p pericardial window 8/20 PEA cardiac arrest, left pigtail chest tube placement, PEA cardiac arrest due to tamponade, bedside sternotomy with pericardial drains placed.  Interim History / Subjective:  As above  Objective   Blood pressure 108/73, pulse (!) 135, temperature 97.8 F (36.6 C), temperature source Axillary, resp. rate 20, height 5\' 7"  (1.702 m), weight 102 kg, SpO2 (!) 75 %.    Vent Mode: PRVC FiO2 (%):  [100 %] 100 % Set Rate:  [16 bmp] 16 bmp Vt Set:  [520 mL] 520 mL PEEP:  [10 cmH20] 10 cmH20 Plateau Pressure:  [30 cmH20] 30 cmH20   Intake/Output Summary (Last 24 hours) at 09/29/2021 1149 Last data filed at 09/29/2021 0800 Gross per 24 hour  Intake 1510 ml  Output 570  ml  Net 940 ml   Filed Weights   09/28/21 0430 09/29/21 0500  Weight: 101 kg 102 kg   Examination: General: ill appearing male, intubated, sedated HENT: [place]/AT, moist mucous membranes, sclera anicteric Lungs: course breath sounds, left chest tube in place, no air leak noted Cardiovascular: rrr, pericardial drains in place, open sternotomy wound with surgical dressing in place Abdomen: soft, bowel sounds dminished Extremities: cool to touch,  no edema Neuro: sedated GU: foley in place  Resolved Hospital Problem list     Assessment & Plan:  PEA Cardiac Arrest Cardiac Tamponade s/p pericardial drain placement Hemorraghic Pericarditis Obstructive Shock Coronary Artery Disease s/p CABG x 5 8/3 Atrial Fibrillation - CT surgery and Cardiology following - Obstructive shock relieved with pericardial drain placement - Continue levophed + epinephrine for MAP >65 - Amiodarone for atrial fibrillation - Aspirin 325mg  BID for pericarditis - Continue statin  Acute Hypoxemic and Hypercapnic Respiratory Failure - continue mechanical ventilation, lung protective strategy - Wean FiO2 for SpO2 92% or greater - PFTs in 2022 were suggestive of restrictive defect - PRN duonebs ordered  Acute Kidney 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 Hyponatremia, mild - Monitor Cr and UOP - trend electrolytes  Sepsis due to Pericarditis - treatment as above - Will broadly cover with vancomycin + cefepime due to bedside sternotomy procedure  Acute Blood Loss Anemia - Maintain hemoglobin 8-10g/dL  Hx of DMII - monitor blood sugars - SSI if needed  Best Practice (right click and "Reselect all SmartList Selections" daily)   Diet/type: NPO DVT prophylaxis: not indicated GI prophylaxis: PPI Lines: Central line and yes and it is still needed Foley:  Yes, and it is still needed Code Status:  full code Last date of multidisciplinary goals of care discussion [patient's friends updated 8/20]  Labs   CBC: Recent Labs  Lab 09/28/21 0435 09/28/21 1412 09/28/21 1605 09/29/21 0359 09/29/21 0430  WBC 17.2*  --  15.4* 20.3*  --   HGB 9.9* 9.2* 9.4* 9.7* 9.9*  HCT 31.8* 27.0* 28.8* 29.9* 29.0*  MCV 93.8  --  88.9 89.8  --   PLT 513*  --  619* 586*  --     Basic Metabolic Panel: Recent Labs  Lab 09/28/21 0435 09/28/21 1412 09/29/21 0359 09/29/21 0430  NA 135 134* 132* 132*  K 4.2 4.8 4.5 4.5  CL 102  --  101  --   CO2 22  --  21*  --   GLUCOSE 147*   --  64*  --   BUN 50*  --  52*  --   CREATININE 2.35*  --  2.72*  --   CALCIUM 8.6*  --  8.2*  --   MG  --   --  2.4  --    GFR: Estimated Creatinine Clearance: 32.5 mL/min (A) (by C-G formula based on SCr of 2.72 mg/dL (H)). Recent Labs  Lab 09/28/21 0405 09/28/21 0435 09/28/21 0828 09/28/21 1102 09/28/21 1605 09/29/21 0359  WBC  --  17.2*  --   --  15.4* 20.3*  LATICACIDVEN 2.9*  --  1.5 1.4  --   --     Liver Function Tests: Recent Labs  Lab 09/28/21 0435  AST 39  ALT 51*  ALKPHOS 117  BILITOT 1.3*  PROT 6.2*  ALBUMIN 2.9*   No results for input(s): "LIPASE", "AMYLASE" in the last 168 hours. No results for input(s): "AMMONIA" in the last 168 hours.  ABG    Component Value Date/Time  PHART 7.342 (L) 09/29/2021 0430   PCO2ART 43.2 09/29/2021 0430   PO2ART 65 (L) 09/29/2021 0430   HCO3 23.5 09/29/2021 0430   TCO2 25 09/29/2021 0430   ACIDBASEDEF 2.0 09/29/2021 0430   O2SAT 91 09/29/2021 0430     Coagulation Profile: Recent Labs  Lab 09/28/21 0435 09/28/21 1605  INR 2.2* 2.0*    Cardiac Enzymes: No results for input(s): "CKTOTAL", "CKMB", "CKMBINDEX", "TROPONINI" in the last 168 hours.  HbA1C: HbA1c, POC (controlled diabetic range)  Date/Time Value Ref Range Status  07/12/2021 02:38 PM 7.6 (A) 0.0 - 7.0 % Final   HbA1c POC (<> result, manual entry)  Date/Time Value Ref Range Status  02/19/2021 10:02 AM >15 4.0 - 5.6 % Final    Comment:    Greater than 15   Hgb A1c MFr Bld  Date/Time Value Ref Range Status  09/10/2021 02:06 PM 6.5 (H) 4.8 - 5.6 % Final    Comment:    (NOTE) Pre diabetes:          5.7%-6.4%  Diabetes:              >6.4%  Glycemic control for   <7.0% adults with diabetes   04/12/2021 03:00 PM 14.4 (H) 4.8 - 5.6 % Final    Comment:    (NOTE) Pre diabetes:          5.7%-6.4%  Diabetes:              >6.4%  Glycemic control for   <7.0% adults with diabetes     CBG: Recent Labs  Lab 09/28/21 2048 09/29/21 0024  09/29/21 0346 09/29/21 0358 09/29/21 0736  GLUCAP 126* 62* 66* 57* 104*    Review of Systems:   Unable to perform ROS due to patient being intubated  Past Medical History:  He,  has a past medical history of AKI (acute kidney injury) ([place]), Anginal pain ([place]), CAD (coronary artery disease), Chest pain (12/24/2020), Diabetes mellitus type 2 in obese [facility]), Diverticulosis, Dyspnea, Dyspnea on exertion (03/16/2015), Family history of adverse reaction to anesthesia, GERD (gastroesophageal reflux disease), Hypercholesteremia, Hypertension associated with diabetes ([place]) (03/16/2015), NSTEMI (non-ST elevated myocardial infarction) ([place]) (03/17/2015), Obesity, Stroke ([place]) (2022), and Tobacco abuse.   Surgical History:   Past Surgical History:  Procedure Laterality Date   CARDIAC CATHETERIZATION N/A 03/19/2015   Procedure: Left Heart Cath and Coronary Angiography;  Surgeon: Lorretta Harp, MD;  Location: [facility];  Service: Cardiovascular;  Laterality: N/A;   CARDIAC CATHETERIZATION N/A 03/20/2015   Procedure: Coronary Stent Intervention;  Surgeon: Lorretta Harp, MD;  Location: [place];  Service: Cardiovascular;  Laterality: N/A;   CARDIAC CATHETERIZATION N/A 03/22/2015   Procedure: Coronary Stent Intervention;  Surgeon: Lorretta Harp, MD;  Location: [facility];  Service: Cardiovascular;  Laterality: N/A;   CORONARY ARTERY BYPASS GRAFT N/A 09/12/2021   Procedure: CORONARY ARTERY BYPASS GRAFTING (CABG) X 5 USING LEFT INTERNAL MAMMARY ARTERY AND ENDOSCOPICALLY HARVESTED RIGHT GREATER SAPHENOUS VEIN.;  Surgeon: [name], MD;  Location: [facility];  Service: Open Heart Surgery;  Laterality: N/A;   LEFT HEART CATH AND CORONARY ANGIOGRAPHY N/A 12/25/2020   Procedure: LEFT HEART CATH AND CORONARY ANGIOGRAPHY;  Surgeon: Troy Sine, MD;  Location: [facility];  Service: Cardiovascular;  Laterality: N/A;   LEFT HEART CATH AND CORONARY ANGIOGRAPHY N/A  12/26/2020   Procedure: LEFT HEART CATH AND CORONARY ANGIOGRAPHY;  Surgeon: Troy Sine, MD;  [facility]  INVASIVE [facility] LAB;  Service: Cardiovascular;  Laterality: N/A;   TEE WITHOUT CARDIOVERSION N/A 09/12/2021   Procedure: TRANSESOPHAGEAL ECHOCARDIOGRAM (TEE);  Surgeon: [name], MD;  Location: [address];  Service: Open Heart Surgery;  Laterality: N/A;   TESTICLE SURGERY  1970   pt states testicle was ascended and had to be "pulled down"   TONSILLECTOMY     as a child     Social History:   reports that he quit smoking about 6 years ago. His smoking use included cigarettes. He smoked an average of .5 packs per day. He has never used smokeless tobacco. He reports current alcohol use. He reports that he does not currently use drugs.   Family History:  His family history is not on file.   Allergies No Known Allergies   Home Medications  Prior to Admission medications   Medication Sig Start Date End Date Taking? Authorizing Provider  amiodarone (PACERONE) 200 MG tablet Take 1 tablet by mouth  twice daily for 2 days then take 1 tablet daily thereafter Patient taking differently: Take 200 mg by mouth daily. 09/20/21  Yes Stehler, Pricilla Larsson, PA-C  amLODipine (NORVASC) 10 MG tablet Take 1 tablet (10 mg total) by mouth daily. 08/21/21  Yes Charlott Rakes, MD  apixaban (ELIQUIS) 5 MG TABS tablet Take 1 tablet (5 mg total) by mouth 2 (two) times daily. 09/20/21  Yes Stehler, Pricilla Larsson, PA-C  aspirin 325 MG tablet Take 325 mg by mouth daily.   Yes [provider]  atorvastatin (LIPITOR) 80 MG tablet Take 1 tablet (80 mg total) by mouth daily. 09/06/21  Yes Charlott Rakes, MD  carvedilol (COREG) 25 MG tablet Take 1 tablet (25 mg total) by mouth 2 (two) times daily with a meal. 09/20/21  Yes Stehler, Pricilla Larsson, PA-C  Dulaglutide (TRULICITY) 1.5 PJ/0.9TO SOPN Inject 1.5 mg into the skin once a week. 06/10/21  Yes Charlott Rakes, MD  ferrous sulfate 325 (65 FE) MG tablet Take 1 tablet (325 mg  total) by mouth daily with breakfast. Please take for 1 month, may stop sooner if develop constipation 09/20/21 09/20/22 Yes Stehler, Pricilla Larsson, PA-C  Insulin Glargine (BASAGLAR KWIKPEN) 100 UNIT/ML Inject 62 Units into the skin daily. Patient taking differently: Inject 71 Units into the skin daily. 09/10/21  Yes [name], MD  oxyCODONE (OXY IR/ROXICODONE) 5 MG immediate release tablet Take 1 tablet (5 mg total) by mouth every 6 (six) hours as needed for severe pain. 09/20/21  Yes Stehler, Pricilla Larsson, PA-C  Semaglutide, 1 MG/DOSE, 4 MG/3ML SOPN Inject 1 mg as directed once a week. 07/12/21  Yes Charlott Rakes, MD  aspirin EC 81 MG tablet Take 1 tablet (81 mg total) by mouth daily. Swallow whole. Patient not taking: Reported on 09/28/2021 09/20/21   Magdalene River, PA-C  Continuous Blood Gluc Sensor (FREESTYLE LIBRE 2 SENSOR) MISC Utilize as directed q 14 days to monitor blood sugar. 05/10/21   Charlott Rakes, MD  glucose blood (TRUE METRIX BLOOD GLUCOSE TEST) test strip Use to check blood sugar three times daily. 06/10/21   Charlott Rakes, MD  Insulin Pen Needle (TRUEPLUS 5-BEVEL PEN NEEDLES) 32G X 4 MM MISC Use to inject Basaglar once daily. 05/10/21   Charlott Rakes, MD  TRUEplus Lancets 28G MISC Use to check blood sugar three times daily. 06/10/21   Charlott Rakes, MD     Critical care time: 80 minutes    Critical care time included review of chart, documentation, bedside assistance during  code and acute ventilator management during code/bedside surgical procedure.   Freda Jackson, MD [facility] Office: [phone]   See Amion for personal pager PCCM on call pager [phone] until 7pm. Please call [place] 7p-7a. [phone]

## 2021-09-29 NOTE — Progress Notes (Signed)
Called MD to Notify him about new on set of slow Afib during shift. Patient would go in and out of Afib also had a few runs of non sustained VT.  MD advised to start a Amiodarone @ a rate of 60.

## 2021-09-29 NOTE — Progress Notes (Signed)
   09/29/21 1037  Clinical Encounter Type  Visited With Patient not available;Health care provider  Visit Type Code;Critical Care;Initial;Spiritual support  Referral From Physician;Nurse (PHYSICIANS: Dr. Glori Bickers, MD; Dr. Lajuana Matte, MD; NURSE: Laban Emperor, RN)  Consult/Referral To Chaplain Melvenia Beam)  Recommendations CODE BLUE  Spiritual Encounters  Spiritual Needs Emotional;Grief support;Prayer   08:37: [place] paged for CODE BLUE. 08:39: Upon arrival to room medical team performing Advanced Life Support Resuscitation efforts on [name]. [name]. - Patient is divorced, with no children, no siblings, parents are deceased, only living family is uncle. Per Dr. Clayborne Dana request attempted to contact patient's uncle, [name] by phone but no answer/NO Message Left. Located  patient's Best Friends, [name] and [name] in 2 Heart waiting area and provided meaningful presence, reflective listening, and updates on the critical nature of their friend's condition. At Friend's request provided intercessory prayer for healing. - Later accompanied Dr. Haroldine Laws and Dr. Kipp Brood to [facility] waiting area, who described procedures performed and provided update on [name] medical condition. Friend's declined opportunity to meet with patient at this time.  [address] [place], [place]. Min., [phone].

## 2021-09-29 NOTE — Progress Notes (Addendum)
Responded to call for ECMO cannulation. Decision for [facility] made due to recurrent arrhythmias despite evacuation of pericardial hematoma causing tamponade. Chest opened with intermittent pericardial massage and defibrillation required. Decision made for central cannulation given frequent arrhythmias and variable cardiac output depending on rhythm, which will also prevent N/S syndrome if he develops refractory hypoxia. He is high risk for ALI/ARDS with transfusions, chest compressions, critical illness, risk of aspiration prior to intubation.   Has RIJ CVC, R fem introducer sheath, L radial A-line Vent adjustments made based on ABG. 5 units insulin + 1 amp of D50w given for hyperK+ on istat Empiric meropenem, vanc added Sedation per PAD protocol. Will change vent to rest settings if needed for lung protective ventilation if Pplat remains >30 once centrally cannulated.  Anticipate need for RRT based on hemodynamic compromise today with preexisting AKI. Nephro consulted this morning.  This patient is critically ill with multiple organ system failure which requires frequent high complexity decision making, assessment, support, evaluation, and titration of therapies. This was completed through the application of advanced monitoring technologies and extensive interpretation of multiple databases. During this encounter critical care time was devoted to patient care services described in this note for 40 minutes.   [name], DO 09/29/21 2:50 PM [place]    Back from OR- open chest, some blood pooling under dressing. Mediastinal drains to suction. Pupils unequal, sluggishly reactive bilaterally  7.23/64/596/30 K+ 4.4 Hct 25 Ionized Ca 1.14  On PRVC 10/ 390/ 10/40% with Pplat 22 ABG  above with adjustments made to ECMO circuit  ECMO Currently running 3.6L, sweep 3. R femoral drainage, aortic return. Giving additional 1pRBC, 2FFP, 1 cryo, 1 platelet,  TXA  Additional critical care time: 45 min.  [name], DO 09/29/21 5:13 PM  Pulmonary & Critical Care

## 2021-09-29 NOTE — Procedures (Signed)
Insertion of Chest Tube Procedure Note  [name]  [id number]  06/18/1960  Date:09/29/21  Time:10:07 AM    Provider Performing: Lajuana Matte   Procedure: Chest Tube Insertion (28366)  Indication(s) Effusion  Consent Risks of the procedure as well as the alternatives and risks of each were explained to the patient and/or caregiver.  Consent for the procedure was obtained and is signed in the bedside chart  Anesthesia Topical only with 1% lidocaine    Time Out Verified patient identification, verified procedure, site/side was marked, verified correct patient position, special equipment/implants available, medications/allergies/relevant history reviewed, required imaging and test results available.   Sterile Technique Maximal sterile technique including full sterile barrier drape, hand hygiene, sterile gown, sterile gloves, mask, hair covering, sterile ultrasound probe cover (if used).   Procedure Description Ultrasound not used to identify appropriate pleural anatomy for placement and overlying skin marked. Area of placement cleaned and draped in sterile fashion.  A 14 French pigtail pleural catheter was placed into the left pleural space using Seldinger technique. Appropriate return of fluid was obtained.  The tube was connected to atrium and placed on -20 cm H2O wall suction.   Complications/Tolerance None; patient tolerated the procedure well. Chest X-ray is ordered to verify placement.   EBL Minimal  Specimen(s) none

## 2021-09-29 NOTE — Progress Notes (Signed)
  Amiodarone Drug - Drug Interaction Consult Note  Recommendations: Monitor closely  Amiodarone is metabolized by the cytochrome P450 system and therefore has the potential to cause many drug interactions. Amiodarone has an average plasma half-life of 50 days (range 20 to 100 days).   There is potential for drug interactions to occur several weeks or months after stopping treatment and the onset of drug interactions may be slow after initiating amiodarone.   [x]  Statins: Increased risk of myopathy.  Other statins: counsel patients to report any muscle pain or weakness immediately.   Thank You,  Sherlon Handing, PharmD, BCPS Please see amion for complete clinical pharmacist phone list 09/29/2021 2:42 AM

## 2021-09-29 NOTE — Procedures (Signed)
Intubation Procedure Note  [name]  [id number]  1960-10-26  Date:09/29/21  Time:10:54 AM   Provider Performing:[name] P [name]    Procedure: Intubation (02409)  Indication(s) Respiratory Failure  Consent Unable to obtain consent due to emergent nature of procedure.- performed after ROSC, not waking up   Anesthesia Versed and Rocuronium   Time Out Verified patient identification, verified procedure, site/side was marked, verified correct patient position, special equipment/implants available, medications/allergies/relevant history reviewed, required imaging and test results available.   Sterile Technique Usual hand hygeine, masks, and gloves were used   Procedure Description Patient positioned in bed supine.  Sedation given as noted above.  Patient was intubated with endotracheal tube using Glidescope.  View was Grade 1 full glottis .  Number of attempts was 1.  Colorimetric CO2 detector was consistent with tracheal placement.   Complications/Tolerance Coded again after induction- brady PEA Chest X-ray is ordered to verify placement.   EBL Minimal   Specimen(s) None  Mark Hy, DO 09/29/21 10:55 AM [name]

## 2021-09-29 NOTE — Progress Notes (Addendum)
      [address]       [place] [address]             [phone]                 1 Day Post-Op Procedure(s) (LRB): PERICARDIAL WINDOW (Right)   Events: Hypoxic overnight Not making much urine Afib, on amio, with sinus pauses now _______________________________________________________________ Vitals: BP 113/76   Pulse (!) 54   Temp 97.8 F (36.6 C) (Axillary)   Resp (!) 26   Ht 5\' 7"  (1.702 m)   Wt 102 kg   SpO2 (!) 88%   BMI 35.22 kg/m  Filed Weights   09/28/21 0430 09/29/21 0500  Weight: 101 kg 102 kg     - Neuro: alert NAd  - Cardiovascular: afib  Drips: amio 30.      - Pulm: increased work of breathing    ABG    Component Value Date/Time   PHART 7.342 (L) 09/29/2021 0430   PCO2ART 43.2 09/29/2021 0430   PO2ART 65 (L) 09/29/2021 0430   HCO3 23.5 09/29/2021 0430   TCO2 25 09/29/2021 0430   ACIDBASEDEF 2.0 09/29/2021 0430   O2SAT 91 09/29/2021 0430    - Abd: ND - Extremity: warm  .Intake/Output      08/19 0701 08/20 0700 08/20 0701 08/21 0700   I.V. (mL/kg) 1500 (14.7)    IV Piggyback 10    Total Intake(mL/kg) 1510 (14.8)    Urine (mL/kg/hr) 200 (0.1) 0 (0)   Stool  0   Chest Tube 350 20   Total Output 550 20   Net +960 -20        Stool Occurrence  0 x      _______________________________________________________________ Labs:    Latest Ref Rng & Units 09/29/2021    4:30 AM 09/29/2021    3:59 AM 09/28/2021    4:05 PM  CBC  WBC 4.0 - 10.5 K/uL  20.3  15.4   Hemoglobin 13.0 - 17.0 g/dL 9.9  9.7  9.4   Hematocrit 39.0 - 52.0 % 29.0  29.9  28.8   Platelets 150 - 400 K/uL  586  619       Latest Ref Rng & Units 09/29/2021    4:30 AM 09/29/2021    3:59 AM 09/28/2021    2:12 PM  CMP  Glucose 70 - 99 mg/dL  64    BUN 8 - 23 mg/dL  52    Creatinine 0.61 - 1.24 mg/dL  2.72    Sodium 135 - 145 mmol/L 132  132  134   Potassium 3.5 - 5.1 mmol/L 4.5  4.5  4.8   Chloride 98 - 111 mmol/L  101    CO2 22 - 32 mmol/L  21     Calcium 8.9 - 10.3 mg/dL  8.2      CXR: L effusion  _______________________________________________________________  Assessment and Plan: POD 1 s/p R VATS pericardial window, hx of CABG on 8/3.  AKI, left effusion  Will place pigtail today on the L.   Continue pericardial drainage.  Pt states that he feels much better, but still ill appearing Will consult Renal Appreciate cardiology recs WBC up, afebrile.  Unclear source, possible pericarditis.  Pt has been afebrile.       Lajuana Matte 09/29/2021 10:04 AM

## 2021-09-29 NOTE — Progress Notes (Addendum)
ADVANCED HF CRITICAL CARE NOTE  61 y/o male with DM2 and CAD. Underwent CABG on 09/12/21.  Admitted yesterday with progressive weakness. Found to have large pericardial effusion and tamponade.   Taken to OR and pericardial drain placed.   This morning underwent pigtail placement for left pleural effusion. Felt better but remained clinically ill with AKI.   Had AF and started on amio.  I was sitting outside room when patient developed bradycardic/PEA arrest.   We started immediate CPR and pushed ep/bicarb. Patient intubated by Dr. [name].   I did bedside echo which showed compressed LV with normal function and large posterior effusion/clot.  Patient arrested again and underwent emergent bedside sternotomy by Dr. Kipp Brood with removal of a large amount of clot. CTs placed.   Will continue hemodynamic support with close monitoring of cardiac and renal status.   Concern for potential Dressler's syndrome with pleuro/pericarditis and marked leukocytosis.   A) 1. Cardiac arrest (PEA/bradycardic) 2. Cardiac tamponade with emergent bedside sternotomy 3. Acute hypoxemic respiratory failure 4. AKI due to ATN 5. 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 AKI  7. CAD s/p CABG x 5  09/12/21  P)  Pressor support as needed. Start colchicine (no NSAIDs with AKI). Hold eliquis for now. Eventual chest closure per TCTS.   CRITICAL CARE Performed by: Glori Bickers  Total critical care time: 60 minutes  Critical care time was exclusive of separately billable procedures and treating other patients.  Critical care was necessary to treat or prevent imminent or life-threatening deterioration.  Critical care was time spent personally by me (independent of midlevel providers or residents) on the following activities: development of treatment plan with patient and/or surrogate as well as nursing, discussions with consultants, evaluation of patient's response to treatment, examination of  patient, obtaining history from patient or surrogate, ordering and performing treatments and interventions, ordering and review of laboratory studies, ordering and review of radiographic studies, pulse oximetry and re-evaluation of patient's condition.  Glori Bickers, MD  11:33 AM

## 2021-09-29 NOTE — Progress Notes (Signed)
Pharmacy Antibiotic Note  [name] is a 61 y.o. male admitted on 09/28/2021 with open chest/ECMO  Pharmacy has been consulted for vancomycin and meropenem dosing.  Received 1500 mg vancomycin in OR today, and 1g last night.  Scr rising after events of today.  Plan: Meropenem 1g q 12 hrs for now, will adjust as needed with renal function. Vancomycin random level with AM labs tomorrow for further dosing.  Height: 5\' 7"  (170.2 cm) Weight: 102 kg (224 lb 13.9 oz) IBW/kg (Calculated) : 66.1  Temp (24hrs), Avg:98 F (36.7 C), Min:97.8 F (36.6 C), Max:98.1 F (36.7 C)  Recent Labs  Lab 09/28/21 0405 09/28/21 0435 09/28/21 0828 09/28/21 1102 09/28/21 1605 09/29/21 0359 09/29/21 1154  WBC  --  17.2*  --   --  15.4* 20.3* 14.7*  CREATININE  --  2.35*  --   --   --  2.72* 3.28*  LATICACIDVEN 2.9*  --  1.5 1.4  --   --   --     Estimated Creatinine Clearance: 26.9 mL/min (A) (by C-G formula based on SCr of 3.28 mg/dL (H)).    No Known Allergies  Antimicrobials this admission:  8/20 Vancomycin > 8/20 Meropenem >   Dose adjustments this admission:   Microbiology results:  None  Thank you for allowing pharmacy to be a part of this patient's care.   [place] Crane, Roylene Reason, BCCP Clinical Pharmacist  09/29/2021 3:24 PM   [facility] pharmacy phone numbers are listed on [email or web address]

## 2021-09-29 NOTE — Transfer of Care (Signed)
Immediate Anesthesia Transfer of Care Note  Patient: LACY TAGLIERI  Procedure(s) Performed: CANNULATION FOR ECMO (EXTRACORPOREAL MEMBRANE OXYGENATION) TRANSESOPHAGEAL ECHOCARDIOGRAM (TEE)  Patient Location: PACU and ICU  Anesthesia Type:General  Level of Consciousness: sedated and Patient remains intubated per anesthesia plan  Airway & Oxygen Therapy: Patient remains intubated per anesthesia plan and Patient placed on Ventilator (see vital sign flow sheet for setting)  Post-op Assessment: Report given to RN and Post -op Vital signs reviewed and stable  Post vital signs: Reviewed and stable  Last Vitals:  Vitals Value Taken Time  BP    Temp    Pulse 193 09/29/21 1610  Resp 16 09/29/21 1622  SpO2 74 % 09/29/21 1610  Vitals shown include unvalidated device data.  Last Pain:  Vitals:   09/29/21 0800  TempSrc:   PainSc: 0-No pain         Complications: No notable events documented.

## 2021-09-29 NOTE — Progress Notes (Signed)
Pt arrived from OR on ECMO at 1611. Pt unstable, See vitals, MD aware. Pt chest open. Pulse pressure narrow, MD aware. See art line pressures. Pierre Bali MD and Ernest Mallick DO at bedside. Total of 4 PRBCs, 3 FFP, 1 platelet & 1 cryo given to patient emergently on this shift per Pierre Bali MD and Ernest Mallick MD. See Chart. Pt Bleeding/leaking outside of [place] dressing, MD aware. Critical results reported to MD promptly face to face at patient bedside. Bedside report given to Antony Haste RN at shift change.   Luberta Mutter RN

## 2021-09-29 NOTE — Progress Notes (Signed)
Paged Cards Fellow to notify them about patient was becoming more episodes of bradycardia in the upper 30's when he was coming out of afib. Blood pressure has been stable at 120's over 70's Maps of 80-90. Cards fellow advised to cut the Amiodarone in have and run it at 30.

## 2021-09-29 NOTE — Discharge Summary (Incomplete)
Physician Discharge Summary       [id number]       [place] [address]             [id number]    Patient ID: [name] MRN: [id number] DOB/AGE: [birth year] 61 y.o.  Admit date: 09/28/2021 Discharge date: 12/03/2021  Admission Diagnoses: Pericardial effusion with tamponade Discharge Diagnoses:  S/p right VATS, pericardial window ATN History of Mark following:  AKI (acute kidney injury) ([name])      pt unaware of this   Anginal pain ([name])     CAD (coronary artery disease)      a. 03/2015 NSTEMI: LHC with severe 3V CAD  (70% mid RCA, 95% OM1, 90% distal LCx, 90% OM3, 80% prox LAD and 90% ost D1) s/p DES to mLAD w/ small dissction Rx with DES, staged ost Ramus PCI/DES and dLCx s/p PCI/DES    Chest pain 12/24/2020   Diabetes mellitus type 2 in obese [name])     Diverticulosis     Dyspnea on exertion 03/16/2015    Dyspnea on exertion   Family history of adverse reaction to anesthesia      patient father- pt states after anesthesia his father "developed dementia"   Hypercholesteremia     Hypertension associated with diabetes ([name]) 03/16/2015    hypertension   NSTEMI (non-ST elevated myocardial infarction) ([name]) 03/17/2015   Obesity     Stroke ([name]) 2022    pt states he had a "mini stroke" during cardiac catheterization   Tobacco abuse        Consults:  Advanced heart failure , pulmonary/CCM  Procedure (s):  Right video assisted thoracoscopy - Pericardial Window - Evacuation of pericardial and pleural effusion effusion by Dr. Kipp Brood on 09/28/2021.  Mediastinal re-exploration  VA ECMO cannulation.  71F Aortic, 23-34F L femoral vein by Dr. Kipp Brood on 09/29/2021.  Mediastinal washout and removal of large 8cm x 6cm left intrapericardial mediastinal hematoma by Dr. Prescott Gum on 10/03/2021.  Insertion of Impella 5.5 via right axillary artery. Weaning from [place] ECMO and decannulation Mediastinal washout by Dr. Cyndia Bent on 10/07/2021.  HPI: This is a 61  year old male who had undergone a CABG x 5 by Dr. Cyndia Bent on 09/12/2021. Post op, he had atrial fibrillation and was put on Apixaban. He was discharged in stable condition on 09/20/2021. He developed worsening shortness of breath. He presented to [name] ED on 09/28/2021 and bedside echo showed a posterior pericardial effusion, concerning for tamponade. Dr. Kipp Brood discussed Mark need for right VATS to drain pericardial effusion. Potential risks, benefits, and complications of Mark surgery were discussed and he agreed to proceed with surgery.  Hospital Course: Patient was transported from Mark OR to [name] ICU in stable condition. Post op, had PAF (mostly with CVR) and NSVT. He was started on an Amiodarone drip. His respiratory status then worsened and he was put on a NRB. ABGs were done as well as a chest x ray. Patient was given Lasix IV 40. He then began having more episodes of bradycardia. Cardiology decreased Amiodarone drip rate. EKG done showed diffuse ST segment elevation consistent with pericarditis.  He also developed worsening renal function (likely ATN) with creatinine increased to 2.72. Perhaps may be related to decreased CO prior to admission. Advanced heart failure and Critical Care were consulted consulted.  Thoracic imaging demonstrated a moderate pericardial effusion with concern for tamponade.  Mark patient was taken Mark operating by Dr. Kipp Brood on 09/28/2021 where right video-assisted  thoracoscopy with pericardial drainage was carried out.  On Mark following day, he continued to be hypoxic.  Chest x-ray showed significant left pleural effusion.  Chest tube was placed by Dr. Kipp Brood at Mark bedside.  Hypoxia persisted, Mark patient was intubated and started on mechanical ventilation by Mark critical care team.  Later that day, Mark patient developed cardiogenic shock and became unresponsive.  Mark mediastinum was explored at Mark bedside by Dr. Kipp Brood.  Mediastinal hematoma was irrigated and  debrided.  Mark mediastinum was loosely packed with Kerlix gauze and an Ioban drape was placed.  Mark patient remained unstable.  Decision was made to proceed with VA ECMO.  Mark patient was taken back to Mark operating room during Mark evening on 10/07/2021.  Cannulation for VA ECMO was accomplished using a 58 French aortic catheter and a 23-25 French left femoral venous catheter.  He was returned to Mark operating room on Mark following day for mediastinal exploration and washout.  Patient was returned to Mark ICU.  Following this procedure, patient had a stable day with nodule decline in Mark chest tube output. He required Neo-Synephrine for blood pressure support.  Urine output remained adequate despite gradual increase in his creatinine with a baseline acute acute on chronic kidney injury.  He was started on IV amiodarone for paroxysmal atrial fibrillation. [name] was returned to Mark operating room on 10/03/2021 by Dr. Darcey Nora for mediastinal washout.  Transesophageal echo was also performed demonstrated a posterior mediastinal hematoma.  This was evacuated successfully.  A wound VAC was applied to Mark open chest wound.  ECMO support continued and he continued to be coagulopathic with significant chest tube drainage.  He was transfused with packed red blood cells as required.  He was also transfused with platelets in order to achieve a platelet count of at least 90,000 planned administration of factor VII for Mark coagulopathy. Nephrology was consulted for worsening AKI on CKD (stage IIIa) (creatinine on 08/28 was up to 3.47). CRRT was arranged. Patient was then taken to OR on 08/28 for insertion of Impella, weaning for [facility] ECMO and decannulation, and mediastinal washout. Milrinone was stopped on 08/30. Epinephrine, Vasopressin, and Nor Epinephrine drips continued to be weaned as able. He was put on Vancomycin and Maxipime for empiric antibiotic coverage due to open sternotomy. He was weaned off Nor Epinephrine and Epi  and Vasopressin were decreased. He was transfused on 08/31 for anemia.   [name] was returned to Mark operating room on 10/12/2021 for mediastinal washout and closure of Mark sternum. Mark skin edges were not able to be completely approximated so a Prevena dressing was applied to Mark site. He tolerated this well.  CRRT continued for renal insufficiency and removal of excess volume.  He remained intubated and mechanically ventilated with stable oxygenation.  Nutrition was supported with tube feeding by way of a small bore nasogastric feeding tube. His mediastinal chest tube was removed without complication. Epinephrine and vasopressin continued to be weaned as tolerated. Sedation was weaned as tolerated. He was hemodynamically stable so Mark Impella was turned down to P3. He had one episode of vomiting with questionable aspiration, tube feeds were slowed with intermittent NG tube suction. He experienced several episodes of loose stools, tested negative for Cdiff.  On 10/17/2021 Mr. Needle returned to Mark operating room for removal of his Impella left ventricular assist device and insertion of a percutaneous tracheostomy. He tolerated Mark procedure well and was transferred back to Mark SICU in stable condition. Epi  drip was restarted at 46mcg/min when returning from Mark operating room. On POD1 he was awake, alert and responding to commands. His CRRT filter clotted so it was turned off and femoral [place] line was removed. He began to become delirious and agitated but was able to follow commands. His creatinine began rising again so a new line was inserted to restart CRRT. Midodrine was started and titrated for his blood pressure so epi could be weaned. His chest wound JP drain was removed along with Mark Pravena dressing and a dry dressing was placed over his sternal incision. He would not tolerate increase tube feed rates so KUB was ordered which showed Mark nasogastric tube was properly place and there were no abnormalities.  His blood pressure remained labile so vasopressin was restarted to allow Epi to be weaned. On 10/21/21 he was started on tracheostomy collar. On 10/23/2021 he went into junctional bradycardia so amiodarone was discontinued. Leukocytosis was noted and CXR showed potential left pleural effusion, blood and respiratory cultures were taken and he was started on broad spectrum antiobiotics. His lactic acid level was critical at 2.4.  Mark patient is tolerating trach collar trials. Leukocytosis improving.  He developed purulent drainage from his arterial line, thus this was removed and new line was placed.  NG tube output remains high, dietary and cortrak team followed and adjusted tube feedings/supplements as indicated. As of 09/16, he was on Epinephrine, Nor epinephrine, and Vasopressin drips. NG tube was removed on 09/17. WBC was up to 22,900 and he remained on Daptomycin and Merropenem. On 09/18 Vasopressin and epinephrine drips was weaned off. He began to tolerate tube feeds at goal. Was able to stand and support weight well. KUB showed NG tube coiled in stomach causing some vomiting and abdominal distension, it was repositioned and confirmed with KUB. Hemodynamically stable on norepinephrine 33mcg and midodrine. CRRT was stopped due to clot in filter. On 09/20 norepinephrine was weaned off. He developed a sacral pressure ulcer. WBC remained elevated at 25.5 after daptomycin and meropenem were completed, he remained on micafungin. On 09/21 he was transitioned to intermittent hemodialysis.  He tolerated this well and plans were made to placement permanent HD catheter.  He was tolerating trach trials with plans to hopefully cap and remove trach soon. Lurline Idol was capped, he tolerated it well. BUN and creatinine rising requiring hemodialysis. Tunneled hemodialysis catheter placed and hemodialysis started. He tolerated his hemodialysis session with a short span of norepinephrine for blood pressure support. Lurline Idol was  decannulated which he tolerated well. CIR coordinator believes that SNF will be his best option at discharge.   On 11/08/21 his WBC was 13,000 and he was afebrile off all antibiotics. Sacral and sternal wounds improved, requiring daily wound care and wet to dry dressings.  He did not tolerate FEES. He was off pressors and tolerating well. He was severely deconditioned and ambulated with extensive assistance. He was tolerating tube feeds at goal.   Interventional radiology was consulted regarding PEG tube placement.  Mark patient was taken to Mark radiology suite on 11/15/2021 for Mark procedure but withdrew consent when he realized an OG catheter would need to be placed while he was awake.  Mark patient said he would not be able to tolerate Mark procedure without general anesthesia.  Mark interventional radiologist discussed these events with Dr. Cyndia Bent.  Decision was made to forego any further attempts on that date and plan for G-tube placement with general anesthesia on 11/20/2021.  Support continued with scheduled hemodialysis and  nutritional support by way of a small bore nasogastric feeding tube. He had persistent diarrhea due to tube feeds, imodium was given as needed. He was felt stable for transfer to progressive unit. PT/OT was consulted and recommended SNF at discharge as well as BSC/3in1, wheelchair, wheelchair cushion and hospital bed. He is currently awaiting insurance coverage. He was brought to Mark operating room on 11/20/21 for percutaneous feeding tube placement and tolerated Mark procedure without complication. Midodrine was weaned as BP tolerated. He continued to have loose stools once bolus tube feeds were started. He underwent a barium swallow study and was recommended to begin dysphagia 2 (fine chop) solids and thin liquid diet.  Mark patient refuses tube feedings due to persistent diarrhea. He was transitioned to dysphagia 3 diet and increased his diet.  His swallowing function continued to improve  and he was transitioned to a regular diet.  Disposition planning has been difficult.  He had a bed offer at a local SNF facility however Mark patient did not wish to be admitted there.  He would prefer to go to CIR if possible.  Unfortunately due to home situation that would be present post discharge in terms of ability to continue to care for Mark patient he is not felt to be a candidate for CIR.  He is now agreeable to SNF placement.  He has been able to tolerate HD in a chair.  His sacral wound is being treated with Medihoney.  He is also undergoing hydrotherapy 2x per week.  He is stable for discharge to SNF today.   Latest Vital Signs: Blood pressure 123/76, pulse 98, temperature 98.4 F (36.9 C), resp. rate (!) 24, height 5\' 7"  (1.702 m), weight 85.3 kg, SpO2 97 %.  Physical Exam:  General appearance: alert, cooperative, and no distress Heart: regular rate and rhythm Lungs: clear to auscultation bilaterally Abdomen: soft, non-tender; bowel sounds normal; no masses,  no organomegaly Extremities: edema none present Wound: improving, clean and dry   Discharge Condition: Stable and discharged to SNF.  Recent laboratory studies:  Lab Results  Component Value Date   WBC 9.5 11/28/2021   HGB 10.4 (L) 11/28/2021   HCT 33.9 (L) 11/28/2021   MCV 90.4 11/28/2021   PLT 360 11/28/2021   Lab Results  Component Value Date   NA 138 12/03/2021   K 5.3 (H) 12/03/2021   CL 97 (L) 12/03/2021   CO2 28 12/03/2021   CREATININE 8.57 (H) 12/03/2021   GLUCOSE 116 (H) 12/03/2021      Diagnostic Studies: DG Swallowing Func-Speech Pathology  Result Date: 11/22/2021 Table formatting from Mark original result was not included. Objective Swallowing Evaluation: Type of Study: MBS-Modified Barium Swallow Study  Patient Details Name: [name] MRN: [id number] Date of Birth: December 17, 1960 Today's Date: 11/22/2021 Time: SLP Start Time (ACUTE ONLY): 1033 -SLP Stop Time (ACUTE ONLY): 1048 SLP Time Calculation  (min) (ACUTE ONLY): 15 min Past Medical History: Past Medical History: Diagnosis Date  AKI (acute kidney injury) ([place])   pt unaware of this  Anginal pain ([place])   CAD (coronary artery disease)   a. 03/2015 NSTEMI: LHC with severe 3V CAD  (70% mid RCA, 95% OM1, 90% distal LCx, 90% OM3, 80% prox LAD and 90% ost D1) s/p DES to mLAD w/ small dissction Rx with DES, staged ost Ramus PCI/DES and dLCx s/p PCI/DES   Chest pain 12/24/2020  Diabetes mellitus type 2 in obese [facility])   Diverticulosis   Dyspnea   Dyspnea on exertion  03/16/2015  Dyspnea on exertion  Family history of adverse reaction to anesthesia   patient father- pt states after anesthesia his father "developed dementia"  GERD (gastroesophageal reflux disease)   Hypercholesteremia   Hypertension associated with diabetes ([place]) 03/16/2015  hypertension  NSTEMI (non-ST elevated myocardial infarction) ([place]) 03/17/2015  Obesity   Stroke ([place]) 2022  pt states he had a "mini stroke" during cardiac catheterization  Tobacco abuse  Past Surgical History: Past Surgical History: Procedure Laterality Date  APPLICATION OF WOUND VAC N/A 0/11/2723  Procedure: APPLICATION OF WOUND VAC;  Surgeon: Dahlia Byes, MD;  Location: [place];  Service: Thoracic;  Laterality: N/A;  CANNULATION FOR ECMO (EXTRACORPOREAL MEMBRANE OXYGENATION) N/A 09/29/2021  Procedure: CANNULATION FOR ECMO (EXTRACORPOREAL MEMBRANE OXYGENATION);  Surgeon: Lajuana Matte, MD;  Location: [facility];  Service: Open Heart Surgery;  Laterality: N/A;  CARDIAC CATHETERIZATION N/A 03/19/2015  Procedure: Left Heart Cath and Coronary Angiography;  Surgeon: Lorretta Harp, MD;  Location: [facility];  Service: Cardiovascular;  Laterality: N/A;  CARDIAC CATHETERIZATION N/A 03/20/2015  Procedure: Coronary Stent Intervention;  Surgeon: Lorretta Harp, MD;  Location: [place] [facility];  Service: Cardiovascular;  Laterality: N/A;  CARDIAC CATHETERIZATION N/A 03/22/2015  Procedure: Coronary Stent Intervention;   Surgeon: Lorretta Harp, MD;  Location: [facility];  Service: Cardiovascular;  Laterality: N/A;  CORONARY ARTERY BYPASS GRAFT N/A 09/12/2021  Procedure: CORONARY ARTERY BYPASS GRAFTING (CABG) X 5 USING LEFT INTERNAL MAMMARY ARTERY AND ENDOSCOPICALLY HARVESTED RIGHT GREATER SAPHENOUS VEIN.;  Surgeon: [name], MD;  Location: [facility];  Service: Open Heart Surgery;  Laterality: N/A;  EXPLORATION POST OPERATIVE OPEN HEART N/A 09/30/2021  Procedure: EXPLORATION POST OPERATIVE OPEN HEART WASHOUT;  Surgeon: Lajuana Matte, MD;  Location: [facility];  Service: Open Heart Surgery;  Laterality: N/A;  IR FLUORO GUIDE CV LINE LEFT  11/04/2021  IR GASTROSTOMY TUBE MOD SED  11/15/2021  IR GASTROSTOMY TUBE MOD SED  11/20/2021  IR US GUIDE VASC ACCESS LEFT  11/04/2021  LEFT HEART CATH AND CORONARY ANGIOGRAPHY N/A 12/25/2020  Procedure: LEFT HEART CATH AND CORONARY ANGIOGRAPHY;  Surgeon: Troy Sine, MD;  Location: [facility];  Service: Cardiovascular;  Laterality: N/A;  LEFT HEART CATH AND CORONARY ANGIOGRAPHY N/A 12/26/2020  Procedure: LEFT HEART CATH AND CORONARY ANGIOGRAPHY;  Surgeon: Troy Sine, MD;  Location: [facility];  Service: Cardiovascular;  Laterality: N/A;  MEDIASTINAL EXPLORATION N/A 10/03/2021  Procedure: MEDIASTINAL WASHOUT;  Surgeon: Dahlia Byes, MD;  Location: [facility];  Service: Thoracic;  Laterality: N/A;  PUMP STANDBY  MEDIASTINAL EXPLORATION N/A 10/12/2021  Procedure: MEDIASTINAL WASHOUT;  Surgeon: [name], MD;  Location: [facility];  Service: Thoracic;  Laterality: N/A;  PERCUTANEOUS TRACHEOSTOMY N/A 10/17/2021  Procedure: PERCUTANEOUS TRACHEOSTOMY USING SHILEY FLEXIBLE 8 mm CUFFED [place].;  Surgeon: Candee Furbish, MD;  Location: [facility];  Service: Pulmonary;  Laterality: N/A;  Percutaneous tracheostomy  PERICARDIAL WINDOW Right 09/28/2021  Procedure: PERICARDIAL WINDOW;  Surgeon: Lajuana Matte, MD;  Location: [facility];  Service: Thoracic;  Laterality: Right;  Right VATS.   Lazy lateral.  double lumen ET tube  PLACEMENT OF IMPELLA LEFT VENTRICULAR ASSIST DEVICE N/A 10/07/2021  Procedure: INSERTION OF RIGHT AXILLARY IMPELLA, DECANNULATION OF ECMO, AND MEDIASTINAL WASHOUT;  Surgeon: [name], MD;  Location: [facility];  Service: Open Heart Surgery;  Laterality: N/A;  Open right axillary with 8 mm Hemashield Platinum Vascular graft.  RADIOLOGY WITH ANESTHESIA N/A 11/20/2021  Procedure: G-Tube Placement;  Surgeon: Sandi Mariscal, MD;  Location: [facility];  Service: Radiology;  Laterality: N/A;  REMOVAL OF IMPELLA LEFT VENTRICULAR ASSIST DEVICE Right 10/17/2021  Procedure: REMOVAL OF IMPELLA LEFT VENTRICULAR ASSIST DEVICE;  Surgeon: [name], MD;  Location: [facility];  Service: Open Heart Surgery;  Laterality: Right;  STERNAL CLOSURE N/A 10/12/2021  Procedure: STERNAL CLOSURE;  Surgeon: [name], MD;  [facility];  Service: Thoracic;  Laterality: N/A;  TEE WITHOUT CARDIOVERSION N/A 09/12/2021  Procedure: TRANSESOPHAGEAL ECHOCARDIOGRAM (TEE);  Surgeon: [name], MD;  Location: [facility];  Service: Open Heart Surgery;  Laterality: N/A;  TEE WITHOUT CARDIOVERSION N/A 09/29/2021  Procedure: TRANSESOPHAGEAL ECHOCARDIOGRAM (TEE);  Surgeon: Lajuana Matte, MD;  Location: [facility];  Service: Open Heart Surgery;  Laterality: N/A;  TEE WITHOUT CARDIOVERSION N/A 10/03/2021  Procedure: TRANSESOPHAGEAL ECHOCARDIOGRAM (TEE);  Surgeon: Dahlia Byes, MD;  Location: [facility];  Service: Thoracic;  Laterality: N/A;  TEE WITHOUT CARDIOVERSION  10/07/2021  Procedure: TRANSESOPHAGEAL ECHOCARDIOGRAM (TEE);  Surgeon: [name], MD;  Location: [facility] OR;  Service: Open Heart Surgery;;  TEE WITHOUT CARDIOVERSION N/A 10/12/2021  Procedure: TRANSESOPHAGEAL ECHOCARDIOGRAM (TEE);  Surgeon: [name], MD;  [facility]: [facility] OR;  Service: Thoracic;  Laterality: N/A;  TEE WITHOUT CARDIOVERSION N/A 10/17/2021  Procedure: TRANSESOPHAGEAL ECHOCARDIOGRAM (TEE);  Surgeon: [name], MD;  Location: [facility];  Service:  Open Heart Surgery;  Laterality: N/A;  TESTICLE SURGERY  1970  pt states testicle was ascended and had to be "pulled down"  TONSILLECTOMY    as a child [name], bradycardia and hypotension. Pt with bil pleural effusion and pericardial effusion s/p VATS with pericardial window 8/19. 8/20 cardiac tamponade with arrest,  intubated with bedside mediastinal exploration and ECMO via fem access. OR 8/21 for reexploration due to mediastinal hemorrhage. 8/28 OR for washout with ECMO decannulation and Impella placed. 8/29 brief PEA arrest with DCCV to NSR. 9/2 chest closure. 9/7 trach and Impella removed.9/11 VAC removed. Decannulated 9/26. PMHx: CAD s/p recent CABG x5 (admitted  8/3-11/23 with post op Afib), CKD stage II, DM, HTN, AKI, Afib and CVA  Subjective: alert but very uncomfortable in [place] chair, agreeable but only if study is kept brief  Recommendations for follow up therapy are one component of a multi-disciplinary discharge planning process, led by Mark attending physician.  Recommendations may be updated based on patient status, additional functional criteria and insurance authorization. Assessment / Plan / Recommendation   11/22/2021  11:00 AM Clinical Impressions Clinical Impression Pt shows great improvements on MBS today compared to oropharyngeal function on previous FEES. He has an oral dysphagia that includes piecemeal swallowing and bilateral buccal pocketing with thin liquids, purees, and solids. He responded well to verbal cues to swallow again, clearing more of his oral residue but not completely clearing his oral cavity. Occasional premature spillage was observed with thin liquids, and he needs somewhat prolonged mastication time with a graham cracker. Pharyngeally there is still some weakness with reduced hyoid excursion and base of tongue retraction, but his pharyngeal residue is significantly reduced. There is a trace coating along his base of tongue and into his  valleculae, but no significant build up of pharyngeal residue regardless of texture. Recommend that pt initiate Dys 2 diet and thin liquids. As pt can demonstrate improving oral cavity clearance, he will likely be able to advance his diet clinically at this point. Would try to optimize positioning during PO  intake, which may be a little more challenging for him given wounds, but he has demonstrated his ability to sit upright in Mark chair and Mark bed. SLP Visit Diagnosis Dysphagia, oropharyngeal phase (R13.12) Impact on safety and function Mild aspiration risk;Risk for inadequate nutrition/hydration     11/22/2021  11:00 AM Treatment Recommendations Treatment Recommendations Therapy as outlined in treatment plan below     11/22/2021  11:00 AM Prognosis Prognosis for Safe Diet Advancement Good Barriers to Reach Goals Cognitive deficits   11/22/2021  11:00 AM Diet Recommendations SLP Diet Recommendations Dysphagia 2 (Fine chop) solids;Thin liquid Liquid Administration via Cup;Straw Medication Administration Whole meds with liquid Compensations Minimize environmental distractions;Slow rate;Small sips/bites;Lingual sweep for clearance of pocketing Postural Changes Seated upright at 90 degrees     11/22/2021  11:00 AM Other Recommendations Oral Care Recommendations Oral care BID Other Recommendations Have oral suction available Follow Up Recommendations Skilled nursing-short term rehab (<3 hours/day) Assistance recommended at discharge Frequent or constant Supervision/Assistance Functional Status Assessment Patient has had a recent decline in their functional status and demonstrates Mark ability to make significant improvements in function in a reasonable and predictable amount of time.   11/22/2021  11:00 AM Frequency and Duration  Speech Therapy Frequency (ACUTE ONLY) min 2x/week Treatment Duration 2 weeks     11/22/2021  11:00 AM Oral Phase Oral Phase Impaired Oral - Thin Cup Premature spillage;Piecemeal  swallowing;Right pocketing in lateral sulci;Left pocketing in lateral sulci Oral - Thin Straw Premature spillage;Piecemeal swallowing;Right pocketing in lateral sulci;Left pocketing in lateral sulci Oral - Puree Piecemeal swallowing;Right pocketing in lateral sulci;Left pocketing in lateral sulci Oral - Regular Piecemeal swallowing;Right pocketing in lateral sulci;Left pocketing in lateral sulci;Impaired mastication Oral - Pill [facility]    11/22/2021  11:00 AM Pharyngeal Phase Pharyngeal Phase Impaired Pharyngeal- Nectar Teaspoon NT Pharyngeal- Thin Teaspoon NT Pharyngeal- Thin Cup Reduced anterior laryngeal mobility;Reduced tongue base retraction;Pharyngeal residue - valleculae Pharyngeal- Thin Straw Reduced anterior laryngeal mobility;Reduced tongue base retraction;Pharyngeal residue - valleculae Pharyngeal- Puree Reduced anterior laryngeal mobility;Reduced tongue base retraction;Pharyngeal residue - valleculae Pharyngeal- Regular Reduced anterior laryngeal mobility;Reduced tongue base retraction;Pharyngeal residue - valleculae Pharyngeal- Pill Reduced anterior laryngeal mobility;Reduced tongue base retraction    11/22/2021  11:00 AM Cervical Esophageal Phase  Cervical Esophageal Phase [facility] Osie Bond., M.A. CCC-SLP Acute Rehabilitation Services Office [phone] Secure chat preferred 11/22/2021, 11:48 AM                     IR GASTROSTOMY TUBE MOD SED  Result Date: 11/20/2021 INDICATION: Recent CABG, now with dysphagia. Please perform percutaneous gastrostomy tube placement for enteric nutrition supplementation purposes. Patient underwent attempted percutaneous gastrostomy tube placement on 11/15/2021 however was unable to tolerate Mark procedure and as such returns today for Mark procedure was performed with general anesthesia. EXAM: PUSH GASTROSTOMY TUBE PLACEMENT COMPARISON:  Attempted image guided percutaneous gastrostomy tube placement-11/15/2021; chest CT-09/28/2021 MEDICATIONS: Ancef 2 gm IV; Antibiotics were  administered within 1 hour of Mark procedure. CONTRAST:  20 cc Omnipaque 300 administered into Mark gastric lumen. ANESTHESIA/SEDATION: General anesthesia FLUOROSCOPY TIME:  1 minute, 18 seconds (35 mGy) COMPLICATIONS: None immediate. PROCEDURE: Informed written consent was obtained from Mark family following explanation of Mark procedure, risks, benefits and alternatives. A time out was performed prior to Mark initiation of Mark procedure. Ultrasound scanning was performed to demarcate Mark edge of Mark left lobe of Mark liver. Attention was made to avoid Mark healing incisions from previous mediastinal drains as a sequela of recent CABG.  Maximal barrier sterile technique utilized including caps, mask, sterile gowns, sterile gloves, large sterile drape, hand hygiene and Betadine prep. Mark left upper quadrant was sterilely prepped and draped. A oral gastric catheter was inserted into Mark stomach under fluoroscopy. Mark existing nasogastric feeding tube was removed. Mark left costal margin and barium opacified transverse colon were identified and avoided. Air was injected into Mark stomach for insufflation and visualization under fluoroscopy. Under sterile conditions and local anesthesia, 3 T tacks were utilized to pexy Mark anterior aspect of Mark stomach against Mark ventral abdominal wall. Contrast injection confirmed appropriate positioning of each of Mark T tacks. An incision was made between Mark T tacks and a 17 gauge trocar needle was utilized to access Mark stomach. Needle position was confirmed within Mark stomach with aspiration of air and injection of a small amount of contrast. A stiff Glidewire was advanced into Mark gastric lumen and under intermittent fluoroscopic guidance, Mark access needle was exchanged for a Kumpe catheter. With Mark use of Mark Kumpe catheter, a stiff Glidewire was advanced into Mark horizontal segment of Mark duodenum. Under intermittent fluoroscopic guidance, Mark Kumpe catheter was exchanged for a  telescoping peel-away sheath, ultimately allowing placement of a 18-French balloon retention gastrostomy tube. Mark retention balloon was insufflated with a mixture of dilute saline and contrast and pulled taut against Mark anterior wall of Mark stomach. Mark external disc was cinched. Contrast injection confirms positioning within Mark stomach. Several spot radiographic images were obtained in various obliquities for documentation. Mark patient tolerated procedure well without immediate post procedural complication. FINDINGS: After successful fluoroscopic guided placement, Mark gastrostomy tube is appropriately positioned with internal retention balloon against Mark ventral aspect of Mark gastric lumen. IMPRESSION: Successful fluoroscopic insertion of an 74 French balloon retention gastrostomy tube. Mark gastrostomy may be used immediately for medication administration and in 24 hrs for Mark initiation of feeds. Electronically Signed   By: Sandi Mariscal M.D.   On: 11/20/2021 14:17   IR GASTROSTOMY TUBE MOD SED  Result Date: 11/15/2021 INDICATION: 61 year old male presents for image guided percutaneous gastrostomy EXAM: IMAGE GUIDED PERCUTANEOUS GASTROSTOMY DISCONTINUED GASTROSTOMY MEDICATIONS: None ANESTHESIA/SEDATION: Versed 1.0 mg IV; Fentanyl 50 mcg IV Moderate Sedation Time:  10 minutes Mark patient was continuously monitored during Mark procedure by Mark interventional radiology nurse under my direct supervision. CONTRAST:  0 FLUOROSCOPY: Radiation Exposure Index (as provided by Mark fluoroscopic device): 30 mGy Kerma COMPLICATIONS: None PROCEDURE: Informed written consent was obtained from Mark patient and Mark patient's family after a thorough discussion of Mark procedural risks, benefits and alternatives. All questions were addressed. Maximal Sterile Barrier Technique was utilized including caps, mask, sterile gowns, sterile gloves, sterile drape, hand hygiene and skin antiseptic. A timeout was performed prior to Mark  initiation of Mark procedure. Mark patient was brought to Mark interventional suite. A time-out was performed, and moderate sedation was initiated, as Mark patient was uncomfortable with Mark initial orogastric tube placement. After Mark initial or gastric tube placement attempt, Mark patient withdrew his consent and did not want to proceed. After some discussion with Mark patient regarding using either or gastric tube placement or Mark existing nasogastric tube placement, it was clear he would not proceed and we discontinue Mark procedure. IMPRESSION: Discontinued attempt at image guided gastrostomy. Signed, Dulcy Fanny. Nadene Rubins, RPVI [facility] Radiology PLAN: We will arrange anaesthesia support for image guided gastrostomy. Electronically Signed   By: Corrie Mckusick D.O.   On: 11/15/2021 16:15  DG Abd 1 View  Result Date: 11/14/2021 CLINICAL DATA:  Check gastric catheter placement EXAM: ABDOMEN - 1 VIEW COMPARISON:  10/31/2021 FINDINGS: Feeding catheter is noted extending into Mark second portion of Mark duodenum. No obstructive changes are seen. IMPRESSION: Feeding catheter in Mark second portion of Mark duodenum. Electronically Signed   By: Inez Catalina M.D.   On: 11/14/2021 03:11   DG Chest Port 1 View  Result Date: 11/12/2021 CLINICAL DATA:  Shortness of breath.  Recent CABG. EXAM: PORTABLE CHEST 1 VIEW COMPARISON:  Chest radiograph November 06, 2021, CT chest 09/28/2021 FINDINGS: Postsurgical changes from median sternotomy and CABG. There is an enteric tube that courses below diaphragm with Mark tip out of Mark Mark of view. Left-sided tunneled central venous catheter in place with Mark tip in Mark mid SVC. Surgical clips are also seen in Mark right axilla. Cardiac and mediastinal contours are unchanged compared to recent chest radiograph. Redemonstrated are bibasilar pulmonary opacities and likely a small to moderate-sized left-sided pleural effusion. Mark hazy  pulmonary opacity at Mark right lung base appears slightly more conspicuous on this exam. There are no displaced rib fractures. There are degenerative changes at Mark right-greater-than-left [facility] joint. Visualized upper abdomen is unremarkable. IMPRESSION: 1. Persistent bibasilar pulmonary opacities and likely small to moderate-sized left-sided pleural effusion. Mark hazy pulmonary opacity at Mark right lung base appears slightly more conspicuous on this exam, which could represent progressive atelectasis and a component of pulmonary edema. 2. Support apparatus as above. Electronically Signed   By: Marin Roberts M.D.   On: 11/12/2021 08:17   DG Chest Port 1 View  Result Date: 11/06/2021 CLINICAL DATA:  Cardiogenic shock.  Recent removal of tracheostomy EXAM: PORTABLE CHEST 1 VIEW COMPARISON:  11/02/2021 FINDINGS: Removal of tracheostomy tube. New large-bore central venous line with tip in Mark distal SVC. Removal of RIGHT IJ catheter. Feeding tube extends Mark stomach. Stable cardiac silhouette. LEFT pleural effusion basilar atelectasis. Mild venous congestion. IMPRESSION: 1. New large-bore central venous line with tip in Mark distal SVC. 2. LEFT basilar atelectasis and effusion. Electronically Signed   By: Suzy Bouchard M.D.   On: 11/06/2021 08:19   IR Fluoro Guide CV Line Left  Result Date: 11/05/2021 INDICATION: 60 year old with acute kidney injury and needs a tunneled dialysis catheter. EXAM: FLUOROSCOPIC AND ULTRASOUND GUIDED PLACEMENT OF A TUNNELED DIALYSIS CATHETER Physician: Stephan Minister. Anselm Pancoast, MD MEDICATIONS: Ancef 2 g, Dilaudid 1 mg ANESTHESIA/SEDATION: Mark patient was continuously monitored during Mark procedure by Mark interventional radiology nurse under my direct supervision. FLUOROSCOPY TIME:  Radiation Exposure Index (as provided by Mark fluoroscopic device): 3.1 mGy Kerma COMPLICATIONS: None immediate. PROCEDURE: Informed consent was obtained for placement of a tunneled dialysis catheter. Mark patient was  placed supine on Mark interventional table. Ultrasound confirmed a patent left internal jugular vein. Ultrasound image obtained for documentation. Mark left neck and chest was prepped and draped in a sterile fashion. Maximal barrier sterile technique was utilized including caps, mask, sterile gowns, sterile gloves, sterile drape, hand hygiene and skin antiseptic. Mark left neck was anesthetized with 1% lidocaine. A small incision was made with #11 blade scalpel. A 21 gauge needle directed into Mark left internal jugular vein with ultrasound guidance. A micropuncture dilator set was placed. A 23 cm tip to cuff Palindrome catheter was selected. Mark skin below Mark left clavicle was anesthetized and a small incision was made with an #11 blade scalpel. A subcutaneous tunnel was formed to Mark vein dermatotomy site. Mark catheter was  brought through Mark tunnel. Mark vein dermatotomy site was dilated to accommodate a peel-away sheath. Mark catheter was placed through Mark peel-away sheath and directed into Mark central venous structures. Mark tip of Mark catheter was placed at superior cavoatrial junction with fluoroscopy. Fluoroscopic images were obtained for documentation. Both lumens were found to aspirate and flush well. Mark proper amount of heparin was flushed in both lumens. Mark vein dermatotomy site was closed using a single layer of absorbable suture and Dermabond. Mark catheter was secured to Mark skin using Prolene suture. IMPRESSION: Successful placement of a left jugular tunneled dialysis catheter using ultrasound and fluoroscopic guidance. Electronically Signed   By: Markus Daft M.D.   On: 11/05/2021 08:23   IR US Guide Vasc Access Left  Result Date: 11/05/2021 INDICATION: 29 year old with acute kidney injury and needs a tunneled dialysis catheter. EXAM: FLUOROSCOPIC AND ULTRASOUND GUIDED PLACEMENT OF A TUNNELED DIALYSIS CATHETER Physician: Stephan Minister. Anselm Pancoast, MD MEDICATIONS: Ancef 2 g, Dilaudid 1 mg ANESTHESIA/SEDATION: Mark  patient was continuously monitored during Mark procedure by Mark interventional radiology nurse under my direct supervision. FLUOROSCOPY TIME:  Radiation Exposure Index (as provided by Mark fluoroscopic device): 3.1 mGy Kerma COMPLICATIONS: None immediate. PROCEDURE: Informed consent was obtained for placement of a tunneled dialysis catheter. Mark patient was placed supine on Mark interventional table. Ultrasound confirmed a patent left internal jugular vein. Ultrasound image obtained for documentation. Mark left neck and chest was prepped and draped in a sterile fashion. Maximal barrier sterile technique was utilized including caps, mask, sterile gowns, sterile gloves, sterile drape, hand hygiene and skin antiseptic. Mark left neck was anesthetized with 1% lidocaine. A small incision was made with #11 blade scalpel. A 21 gauge needle directed into Mark left internal jugular vein with ultrasound guidance. A micropuncture dilator set was placed. A 23 cm tip to cuff Palindrome catheter was selected. Mark skin below Mark left clavicle was anesthetized and a small incision was made with an #11 blade scalpel. A subcutaneous tunnel was formed to Mark vein dermatotomy site. Mark catheter was brought through Mark tunnel. Mark vein dermatotomy site was dilated to accommodate a peel-away sheath. Mark catheter was placed through Mark peel-away sheath and directed into Mark central venous structures. Mark tip of Mark catheter was placed at superior cavoatrial junction with fluoroscopy. Fluoroscopic images were obtained for documentation. Both lumens were found to aspirate and flush well. Mark proper amount of heparin was flushed in both lumens. Mark vein dermatotomy site was closed using a single layer of absorbable suture and Dermabond. Mark catheter was secured to Mark skin using Prolene suture. IMPRESSION: Successful placement of a left jugular tunneled dialysis catheter using ultrasound and fluoroscopic guidance. Electronically Signed   By: Markus Daft M.D.   On: 11/05/2021 08:23         Discharge Medications: Allergies as of 12/03/2021   No Active Allergies      Medication List     STOP taking these medications    amiodarone 200 MG tablet Commonly known as: PACERONE   amLODipine 10 MG tablet Commonly known as: NORVASC   aspirin 325 MG tablet   aspirin EC 81 MG tablet Replaced by: aspirin 81 MG chewable tablet   Basaglar KwikPen 100 UNIT/ML   carvedilol 25 MG tablet Commonly known as: COREG   Eliquis 5 MG Tabs tablet Generic drug: apixaban   ferrous sulfate 325 (65 FE) MG tablet   oxyCODONE 5 MG immediate release tablet Commonly known as: Oxy IR/ROXICODONE  Ozempic (1 MG/DOSE) 4 MG/3ML Sopn Generic drug: Semaglutide (1 MG/DOSE)   Trulicity 1.5 GU/4.4IH Sopn Generic drug: Dulaglutide       TAKE these medications    acetaminophen 325 MG tablet Commonly known as: TYLENOL Take 2 tablets (650 mg total) by mouth every 6 (six) hours as needed for mild pain, headache or fever.   aspirin 81 MG chewable tablet Chew 1 tablet (81 mg total) by mouth daily. Replaces: aspirin EC 81 MG tablet   atorvastatin 80 MG tablet Commonly known as: LIPITOR Take 1 tablet (80 mg total) by mouth daily.   feeding supplement (OSMOLITE 1.5 CAL) Liqd Place 237 mLs into feeding tube 4 (four) times daily. Bolus a full carton of Osmolite 1.5 (237 ml) formula via GRAVITY per PEG using a 50-60 cc syringe. Flush PEG with 20 ml before and after each bolus.  Pt is willing to take bolus tube feeds. He would prefer not to be asked each time whether he wants Mark bolus feed but instead would like it to be administered at Mark scheduled time.   folic acid 1 MG tablet Commonly known as: FOLVITE Take 1 tablet (1 mg total) by mouth daily.   FreeStyle Libre 2 [employer] as directed q 14 days to monitor blood sugar.   insulin aspart 100 UNIT/ML injection Commonly known as: novoLOG Inject 0-6 Units into Mark skin every 4  (four) hours. CBG 70-150 ( 0 units) 151-200 (1 unit) 201-250 (2 units) 251-300 ( 3 units) 301-350 ( 4 units) 351-400 (5 units) >400 ( 6 units)   leptospermum manuka honey Pste paste Apply 1 Application topically daily. Apply Medihoney to sacrum/bilat buttock wound Q day, then cover with gauze and abd pad and tape  Apply thin layer (3 mm) to wound.   midodrine 10 MG tablet Commonly known as: PROAMATINE Take 1 tablet (10 mg total) by mouth 3 (three) times daily with meals.   multivitamin Tabs tablet Take 1 tablet by mouth at bedtime.   pantoprazole 40 MG tablet Commonly known as: PROTONIX Take 1 tablet (40 mg total) by mouth 2 (two) times daily.   polyvinyl alcohol 1.4 % ophthalmic solution Commonly known as: LIQUIFILM TEARS Place 2 drops into both eyes as needed for dry eyes.   QUEtiapine 50 MG tablet Commonly known as: SEROQUEL Take 1 tablet (50 mg total) by mouth at bedtime.   thiamine 100 MG tablet Commonly known as: Vitamin B-1 Take 1 tablet (100 mg total) by mouth daily.   True Metrix Blood Glucose Test test strip Generic drug: glucose blood Use to check blood sugar three times daily.   TRUEplus 5-Bevel Pen Needles 32G X 4 MM Misc Generic drug: Insulin Pen Needle Use to inject Basaglar once daily.   TRUEplus Lancets 28G Misc Use to check blood sugar three times daily.       Mark patient has been discharged on:   1.Beta Blocker:  Yes [   ]                              No   Valu.Nieves   ]                              If No, reason: hypotension  2.Ace Inhibitor/ARB: Yes [   ]  No  [ x ]                                     If No, reason: hypotension  3.Statin:   Yes [x   ]                  No  [   ]                  If No, reason:  4.Ecasa:  Yes  [ x  ]                  No   [   ]                  If No, reason:  Patient had ACS upon admission:No  Plavix/P2Y12 inhibitor: Yes [   ]                                      No   [x   ]   Follow Up Appointments:  Follow-up Information      [facility] Follow up on 12/06/2021.   Specialty: Cardiology Why: [facility] 9:30 am Entrance C, Free Valet Parking Please bring all meds to appointment Contact information: [address] [id number] [place]        [name], MD Follow up on 12/25/2021.   Specialty: Cardiothoracic Surgery Why: Appointment is at 2:30 Contact information: [address] [place] [address] [place] Follow up on 12/25/2021.   Why: Appointment is at 1:30 for chest x ray Contact information: [place], [facility]. Go on 12/05/2021.   Why: Schedule is Tuesday/Thursday/Saturday with 12:40 chair time.  Please arrive on Thursday at 11:45 to complete paperwork prior to first treatment. Contact information: [address] [place] [address] [phone]                 Signed: Ellamae Sia 12/03/2021, 1:59 PM

## 2021-09-29 NOTE — Anesthesia Postprocedure Evaluation (Signed)
Anesthesia Post Note  Patient: [name]  Procedure(s) Performed: CANNULATION FOR ECMO (EXTRACORPOREAL MEMBRANE OXYGENATION) TRANSESOPHAGEAL ECHOCARDIOGRAM (TEE)     Patient location during evaluation: SICU Anesthesia Type: General Level of consciousness: sedated Pain management: pain level controlled Vital Signs Assessment: post-procedure vital signs reviewed and stable Respiratory status: patient remains intubated per anesthesia plan Cardiovascular status: stable Postop Assessment: no apparent nausea or vomiting Anesthetic complications: no   No notable events documented.  Last Vitals:  Vitals:   09/29/21 1700 09/29/21 1800  BP: (!) 78/47 97/87  Pulse:  (!) 59  Resp: 12   Temp:    SpO2:  93%    Last Pain:  Vitals:   09/29/21 0800  TempSrc:   PainSc: 0-No pain                 Cristi Gwynn P Beverlyn Mcginness

## 2021-09-29 NOTE — Progress Notes (Signed)
  Patient back from OR after washout and VA ECMO cannulation  Intubated/sedated  MAP 70s minimal pulsativity on A-line. Still having significant bleeding under chest dressing.   ECMO flows ok.   Small amount of urine output.   Sweep turned up for CO2 retention.   Low dose epi added to increase pulsativity and avoid LV over distension.   D/w Dr. [name] and RBCs, FFP, cryo and TXA ordered.   Additional 30 min CCT  Glori Bickers, MD  5:29 PM

## 2021-09-29 NOTE — Progress Notes (Addendum)
   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   Case d/w Dr. Kipp Brood by phone.   Decision made to unpack chest and suction pericardium and perform cardiac massage.   Esmarch dressing take down. Chest emergently prepped with betadine and packing removed.   Pericardium suctioned with no significant response. Being careful to navigate around coronary grafts, I performed cardiac massage. Patient intermittently had a rhythm and cardiac activity. Developed two episodes of VT and VF requiring intracardiac defib x2 at 20 J. Given multiple rounds of epi, bicarb and calcium.   Dr. Kipp Brood arrived quickly at bedside and took over. Patient with stable vitals on epi gtt. Emergently transported to OR for washout and VA ECMO support. Plan support discussed with ECMO/perfusion teams.   Additional CCT 90 minutes.   Glori Bickers, MD  3:01 PM

## 2021-09-29 NOTE — Progress Notes (Addendum)
Patient transported on vent and ECMO from OR to 6K44 without complications.  Vent changes made by [facility], CCM post ECMO initiation.

## 2021-09-29 NOTE — Hospital Course (Addendum)
HPI: This is a 61 year old male who had undergone a CABG x 5 by Dr. Cyndia Bent on 09/12/2021. Post op, he had atrial fibrillation and was put on Apixaban. He was discharged in stable condition on 09/20/2021. He developed worsening shortness of breath. He presented to [facility] ED on 09/28/2021 and bedside echo showed a posterior pericardial effusion, concerning for tamponade. Dr. Kipp Brood discussed the need for right VATS to drain pericardial effusion. Potential risks, benefits, and complications of the surgery were discussed and he agreed to proceed with surgery.  Hospital Course: Patient was transported from the OR to [facility] ICU in stable condition. Post op, had PAF (mostly with CVR) and NSVT. He was started on an Amiodarone drip. His respiratory status then worsened and he was put on a NRB. ABGs were done as well as a chest x ray. Patient was given Lasix IV 40. He then began having more episodes of bradycardia. Cardiology decreased Amiodarone drip rate. EKG done showed diffuse ST segment elevation consistent with pericarditis.  He also developed worsening renal function (likely ATN) with creatinine increased to 2.72. Perhaps may be related to decreased CO prior to admission. Advanced heart failure and Critical Care were consulted consulted.  Thoracic imaging demonstrated a moderate pericardial effusion with concern for tamponade.  The patient was taken the operating by Dr. Kipp Brood on 09/28/2021 where right video-assisted thoracoscopy with pericardial drainage was carried out.  On the following day, he continued to be hypoxic.  Chest x-ray showed significant left pleural effusion.  Chest tube was placed by Dr. Kipp Brood at the bedside.  Hypoxia persisted, the patient was intubated and started on mechanical ventilation by the critical care team.  Later that day, the patient developed cardiogenic shock and became unresponsive.  The mediastinum was explored at the bedside by Dr. Kipp Brood.  Mediastinal hematoma was  irrigated and debrided.  The mediastinum was loosely packed with Kerlix gauze and an [place] drape was placed.  The patient remained unstable.  Decision was made to proceed with VA ECMO.  The patient was taken back to the operating room during the evening on 10/07/2021.  Cannulation for VA ECMO was accomplished using a 15 French aortic catheter and a 23-25 French left femoral venous catheter.  He was returned to the operating room on the following day for mediastinal exploration and washout.  Patient was returned to the ICU.  Following this procedure, patient had a stable day with nodule decline in the chest tube output. He required Neo-Synephrine for blood pressure support.  Urine output remained adequate despite gradual increase in his creatinine with a baseline acute acute on chronic kidney injury.  He was started on IV amiodarone for paroxysmal atrial fibrillation. [name] was returned to the operating room on 10/03/2021 by Dr. Darcey Nora for mediastinal washout.  Transesophageal echo was also performed demonstrated a posterior mediastinal hematoma.  This was evacuated successfully.  A wound VAC was applied to the open chest wound.  ECMO support continued and he continued to be coagulopathic with significant chest tube drainage.  He was transfused with packed red blood cells as required.  He was also transfused with platelets in order to achieve a platelet count of at least 90,000 planned administration of factor VII for the coagulopathy. Nephrology was consulted for worsening AKI on CKD (stage IIIa) (creatinine on 08/28 was up to 3.47). CRRT was arranged. Patient was then taken to OR on 08/28 for insertion of Impella, weaning for [facility] ECMO and decannulation, and mediastinal washout. Milrinone  was stopped on 08/30. Epinephrine, Vasopressin, and Nor Epinephrine drips continued to be weaned as able. He was put on Vancomycin and Maxipime for empiric antibiotic coverage due to open sternotomy. He was weaned off Nor  Epinephrine and Epi and Vasopressin were decreased. He was transfused on 08/31 for anemia.   [name] was returned to the operating room on 10/12/2021 for mediastinal washout and closure of the sternum. The skin edges were not able to be completely approximated so a Prevena dressing was applied to the site. He tolerated this well.  CRRT continued for renal insufficiency and removal of excess volume.  He remained intubated and mechanically ventilated with stable oxygenation.  Nutrition was supported with tube feeding by way of a small bore nasogastric feeding tube. His mediastinal chest tube was removed without complication. Epinephrine and vasopressin continued to be weaned as tolerated. Sedation was weaned as tolerated. He was hemodynamically stable so the Impella was turned down to P3. He had one episode of vomiting with questionable aspiration, tube feeds were slowed with intermittent NG tube suction. He experienced several episodes of loose stools, tested negative for Cdiff.  On 10/17/2021 Mr. Charo returned to the operating room for removal of his Impella left ventricular assist device and insertion of a percutaneous tracheostomy. He tolerated the procedure well and was transferred back to the SICU in stable condition. Epi drip was restarted at 70mcg/min when returning from the operating room. On POD1 he was awake, alert and responding to commands. His CRRT filter clotted so it was turned off and femoral [place] line was removed. He began to become delirious and agitated but was able to follow commands. His creatinine began rising again so a new line was inserted to restart CRRT. Midodrine was started and titrated for his blood pressure so epi could be weaned. His chest wound JP drain was removed along with the Pravena dressing and a dry dressing was placed over his sternal incision. He would not tolerate increase tube feed rates so KUB was ordered which showed the nasogastric tube was properly place and there  were no abnormalities. His blood pressure remained labile so vasopressin was restarted to allow Epi to be weaned. On 10/21/21 he was started on tracheostomy collar. On 10/23/2021 he went into junctional bradycardia so amiodarone was discontinued. Leukocytosis was noted and CXR showed potential left pleural effusion, blood and respiratory cultures were taken and he was started on broad spectrum antiobiotics. His lactic acid level was critical at 2.4.  The patient is tolerating trach collar trials. Leukocytosis improving.  He developed purulent drainage from his arterial line, thus this was removed and new line was placed.  NG tube output remains high, dietary and cortrak team followed and adjusted tube feedings/supplements as indicated. As of 09/16, he was on Epinephrine, Nor epinephrine, and Vasopressin drips. NG tube was removed on 09/17. WBC was up to 22,900 and he remained on Daptomycin and Merropenem. On 09/18 Vasopressin and epinephrine drips was weaned off. He began to tolerate tube feeds at goal. Was able to stand and support weight well. KUB showed NG tube coiled in stomach causing some vomiting and abdominal distension, it was repositioned and confirmed with KUB. Hemodynamically stable on norepinephrine 81mcg and midodrine. CRRT was stopped due to clot in filter. On 09/20 norepinephrine was weaned off. He developed a sacral pressure ulcer. WBC remained elevated at 25.5 after daptomycin and meropenem were completed, he remained on micafungin. On 09/21 he was transitioned to intermittent hemodialysis.  He tolerated this  well and plans were made to placement permanent HD catheter.  He was tolerating trach trials with plans to hopefully cap and remove trach soon. Lurline Idol was capped, he tolerated it well. BUN and creatinine rising requiring hemodialysis. Tunneled hemodialysis catheter placed and hemodialysis started. He tolerated his hemodialysis session with a short span of norepinephrine for blood pressure  support. Lurline Idol was decannulated which he tolerated well. CIR coordinator believes that SNF will be his best option at discharge.   On 11/08/21 his WBC was 13,000 and he was afebrile off all antibiotics. Sacral and sternal wounds improved, requiring daily wound care and wet to dry dressings.  He did not tolerate FEES. He was off pressors and tolerating well. He was severely deconditioned and ambulated with extensive assistance. He was tolerating tube feeds at goal.   Interventional radiology was consulted regarding PEG tube placement.  The patient was taken to the radiology suite on 11/15/2021 for the procedure but withdrew consent when he realized an OG catheter would need to be placed while he was awake.  The patient said he would not be able to tolerate the procedure without general anesthesia.  The interventional radiologist discussed these events with Dr. Cyndia Bent.  Decision was made to forego any further attempts on that date and plan for G-tube placement with general anesthesia on 11/20/2021.  Support continued with scheduled hemodialysis and nutritional support by way of a small bore nasogastric feeding tube. He had persistent diarrhea due to tube feeds, imodium was given as needed. He was felt stable for transfer to progressive unit. PT/OT was consulted and recommended SNF at discharge as well as BSC/3in1, wheelchair, wheelchair cushion and hospital bed. He is currently awaiting insurance coverage. He was brought to the operating room on 11/20/21 for percutaneous feeding tube placement and tolerated the procedure without complication. Midodrine was weaned as BP tolerated. He continued to have loose stools once bolus tube feeds were started. He underwent a barium swallow study and was recommended to begin dysphagia 2 (fine chop) solids and thin liquid diet.  The patient refuses tube feedings due to persistent diarrhea. He was transitioned to dysphagia 3 diet and increased his diet.  His swallowing function  continued to improve and he was transitioned to a regular diet.  Disposition planning has been difficult.  He had a bed offer at a local SNF facility however the patient did not wish to be admitted there.  He would prefer to go to CIR if possible.  Unfortunately due to home situation that would be present post discharge in terms of ability to continue to care for the patient he is not felt to be a candidate for CIR.  Skilled nursing facility search is underway but options are limited due to Medicaid status.

## 2021-09-29 NOTE — CV Procedure (Signed)
ECMO NOTE:   Indication: Cardiac arrest   Initial cannulation date: 09/29/21   ECMO type: VA ECMO   Dual lumen inflow/return cannula:   1) 23/25 FR multistage venous return cannula in RFV 2) Central aortic arterial return   ECMO events:   - Initial cannulation 09/29/21    Daily data:   Flow 3.7 L RPM 3100 Sweep  1 L   Labs:   ABG    Component Value Date/Time   PHART 7.486 (H) 09/29/2021 1443   PCO2ART 23.5 (L) 09/29/2021 1443   PO2ART 112 (H) 09/29/2021 1443   HCO3 18.5 (L) 09/29/2021 1443   TCO2 18 (L) 09/29/2021 1448   ACIDBASEDEF 5.0 (H) 09/29/2021 1443   O2SAT 99 09/29/2021 1443      Plan:  - Continue ECMO - No heparin x 48 hours     Discussed in multidisciplinary fashion with CCM, TCTS, Cardiology, ECMO coordinator/specialist, RT, PharmD and nursing staff all present.    Glori Bickers, MD  4:14 PM

## 2021-09-29 NOTE — Progress Notes (Signed)
  Echocardiogram 2D Echocardiogram has been performed.  [name] 09/29/2021, 3:02 PM

## 2021-09-29 NOTE — Op Note (Signed)
      [address]       [place] [address]             [phone]                                          09/29/2021 Patient:  Rudean Curt Pre-Op Dx: Cardiogenic shock Cardiac tamponade Mediastinal clot   Post-op Dx:  same Procedure: Bedside mediastinal re-exploration    Surgeon and Role:      * Munir Victorian, Lucile Crater, MD - Primary    Josie Saunders - assisting Anesthesia  general   Drains: 57 F blake drain X2: mediastinal   Counts: correct   Indications: 61yo male admitted with cardiac tamponade following CABG several weeks ago.  He underwent a pericardial window yesterday with partial clearance due to organized clot.  While in the ICU, he became unresponsive, and lost his pressure, requiring CPR for 1 minute.  After regaining his pulse, a bedside echo was performed which showed a large posterior clot. Findings: Organized clot along the posterior wall of the heart  Operative Technique: The patient was prepped and draped in normal sterile fashion.  An appropriate surgical pause was performed, and pre-operative antibiotics were dosed accordingly.  The previous sternotomy was re-opened, and the sternal retractor was then placed.  His pressure immediately improved upon chest opening.  The mediastinal hematoma was irrigated, and debrided.   Chest tubes were placed, and the mediastinum was loosely packed with kerlex gauze.  An Charlie Pitter was used to cover the chest.  The patient tolerated the procedure without any immediate complications, and was transferred to the ICU in guarded condition.  Greig Altergott Bary Leriche

## 2021-09-30 ENCOUNTER — Inpatient Hospital Stay (HOSPITAL_COMMUNITY): Payer: Self-pay

## 2021-09-30 ENCOUNTER — Inpatient Hospital Stay (HOSPITAL_COMMUNITY): Payer: Self-pay | Admitting: Registered Nurse

## 2021-09-30 ENCOUNTER — Encounter (HOSPITAL_COMMUNITY): Payer: Self-pay | Admitting: Thoracic Surgery (Cardiothoracic Vascular Surgery)

## 2021-09-30 ENCOUNTER — Encounter (HOSPITAL_COMMUNITY)
Admission: EM | Disposition: A | Discharge status: Skilled Nursing Facility | Payer: Self-pay | Source: Home / Self Care | Attending: Surgery

## 2021-09-30 DIAGNOSIS — Z87891 Personal history of nicotine dependence: Secondary | ICD-10-CM

## 2021-09-30 DIAGNOSIS — I25119 Atherosclerotic heart disease of native coronary artery with unspecified angina pectoris: Secondary | ICD-10-CM

## 2021-09-30 DIAGNOSIS — I314 Cardiac tamponade: Secondary | ICD-10-CM

## 2021-09-30 DIAGNOSIS — T8111XA Postprocedural  cardiogenic shock, initial encounter: Secondary | ICD-10-CM

## 2021-09-30 DIAGNOSIS — I10 Essential (primary) hypertension: Secondary | ICD-10-CM

## 2021-09-30 DIAGNOSIS — I97611 Postprocedural hemorrhage and hematoma of a circulatory system organ or structure following cardiac bypass: Secondary | ICD-10-CM

## 2021-09-30 DIAGNOSIS — I97638 Postprocedural hematoma of a circulatory system organ or structure following other circulatory system procedure: Secondary | ICD-10-CM

## 2021-09-30 HISTORY — PX: EXPLORATION POST OPERATIVE OPEN HEART: SHX5061

## 2021-09-30 LAB — BASIC METABOLIC PANEL
Anion gap: 10 (ref 5–15)
Anion gap: 7 (ref 5–15)
Anion gap: 5 (ref 5–15)
BUN: 41 mg/dL — ABNORMAL HIGH (ref 8–23)
BUN: 38 mg/dL — ABNORMAL HIGH (ref 8–23)
BUN: 38 mg/dL — ABNORMAL HIGH (ref 8–23)
CO2: 23 mmol/L (ref 22–32)
CO2: 26 mmol/L (ref 22–32)
CO2: 28 mmol/L (ref 22–32)
Calcium: 8.3 mg/dL — ABNORMAL LOW (ref 8.9–10.3)
Calcium: 7.6 mg/dL — ABNORMAL LOW (ref 8.9–10.3)
Calcium: 7.6 mg/dL — ABNORMAL LOW (ref 8.9–10.3)
Chloride: 104 mmol/L (ref 98–111)
Chloride: 105 mmol/L (ref 98–111)
Chloride: 106 mmol/L (ref 98–111)
Creatinine, Ser: 2.48 mg/dL — ABNORMAL HIGH (ref 0.61–1.24)
Creatinine, Ser: 2.48 mg/dL — ABNORMAL HIGH (ref 0.61–1.24)
Creatinine, Ser: 2.44 mg/dL — ABNORMAL HIGH (ref 0.61–1.24)
GFR, Estimated: 29 mL/min — ABNORMAL LOW (ref >60)
GFR, Estimated: 29 mL/min — ABNORMAL LOW (ref >60)
GFR, Estimated: 29 mL/min — ABNORMAL LOW (ref >60)
Glucose, Bld: 139 mg/dL — ABNORMAL HIGH (ref 70–99)
Glucose, Bld: 136 mg/dL — ABNORMAL HIGH (ref 70–99)
Glucose, Bld: 126 mg/dL — ABNORMAL HIGH (ref 70–99)
Potassium: 3.6 mmol/L (ref 3.5–5.1)
Potassium: 3.8 mmol/L (ref 3.5–5.1)
Potassium: 4 mmol/L (ref 3.5–5.1)
Sodium: 137 mmol/L (ref 135–145)
Sodium: 138 mmol/L (ref 135–145)
Sodium: 139 mmol/L (ref 135–145)

## 2021-09-30 LAB — POCT I-STAT 7, (LYTES, BLD GAS, ICA,H+H)
Acid-Base Excess: 4 mmol/L — ABNORMAL HIGH (ref 0.0–2.0)
Acid-Base Excess: 5 mmol/L — ABNORMAL HIGH (ref 0.0–2.0)
Acid-Base Excess: 1 mmol/L (ref 0.0–2.0)
Acid-Base Excess: 3 mmol/L — ABNORMAL HIGH (ref 0.0–2.0)
Acid-Base Excess: 4 mmol/L — ABNORMAL HIGH (ref 0.0–2.0)
Acid-Base Excess: 4 mmol/L — ABNORMAL HIGH (ref 0.0–2.0)
Acid-Base Excess: 5 mmol/L — ABNORMAL HIGH (ref 0.0–2.0)
Acid-Base Excess: 5 mmol/L — ABNORMAL HIGH (ref 0.0–2.0)
Acid-Base Excess: 7 mmol/L — ABNORMAL HIGH (ref 0.0–2.0)
Acid-Base Excess: 5 mmol/L — ABNORMAL HIGH (ref 0.0–2.0)
Acid-base deficit: 10 mmol/L — ABNORMAL HIGH (ref 0.0–2.0)
Acid-base deficit: 3 mmol/L — ABNORMAL HIGH (ref 0.0–2.0)
Bicarbonate: 30 mmol/L — ABNORMAL HIGH (ref 20.0–28.0)
Bicarbonate: 31.3 mmol/L — ABNORMAL HIGH (ref 20.0–28.0)
Bicarbonate: 24.6 mmol/L (ref 20.0–28.0)
Bicarbonate: 27.7 mmol/L (ref 20.0–28.0)
Bicarbonate: 19.1 mmol/L — ABNORMAL LOW (ref 20.0–28.0)
Bicarbonate: 22.6 mmol/L (ref 20.0–28.0)
Bicarbonate: 28.6 mmol/L — ABNORMAL HIGH (ref 20.0–28.0)
Bicarbonate: 29.3 mmol/L — ABNORMAL HIGH (ref 20.0–28.0)
Bicarbonate: 29.7 mmol/L — ABNORMAL HIGH (ref 20.0–28.0)
Bicarbonate: 30.2 mmol/L — ABNORMAL HIGH (ref 20.0–28.0)
Bicarbonate: 29.5 mmol/L — ABNORMAL HIGH (ref 20.0–28.0)
Bicarbonate: 29.5 mmol/L — ABNORMAL HIGH (ref 20.0–28.0)
Calcium, Ion: 1.1 mmol/L — ABNORMAL LOW (ref 1.15–1.40)
Calcium, Ion: 1.13 mmol/L — ABNORMAL LOW (ref 1.15–1.40)
Calcium, Ion: 1.1 mmol/L — ABNORMAL LOW (ref 1.15–1.40)
Calcium, Ion: 1.04 mmol/L — ABNORMAL LOW (ref 1.15–1.40)
Calcium, Ion: 1.11 mmol/L — ABNORMAL LOW (ref 1.15–1.40)
Calcium, Ion: 1.17 mmol/L (ref 1.15–1.40)
Calcium, Ion: 1.14 mmol/L — ABNORMAL LOW (ref 1.15–1.40)
Calcium, Ion: 1.17 mmol/L (ref 1.15–1.40)
Calcium, Ion: 1.22 mmol/L (ref 1.15–1.40)
Calcium, Ion: 1.11 mmol/L — ABNORMAL LOW (ref 1.15–1.40)
Calcium, Ion: 1.11 mmol/L — ABNORMAL LOW (ref 1.15–1.40)
Calcium, Ion: 1.13 mmol/L — ABNORMAL LOW (ref 1.15–1.40)
HCT: 27 % — ABNORMAL LOW (ref 39.0–52.0)
HCT: 19 % — ABNORMAL LOW (ref 39.0–52.0)
HCT: 21 % — ABNORMAL LOW (ref 39.0–52.0)
HCT: 27 % — ABNORMAL LOW (ref 39.0–52.0)
HCT: 33 % — ABNORMAL LOW (ref 39.0–52.0)
HCT: 34 % — ABNORMAL LOW (ref 39.0–52.0)
HCT: 21 % — ABNORMAL LOW (ref 39.0–52.0)
HCT: 25 % — ABNORMAL LOW (ref 39.0–52.0)
HCT: 28 % — ABNORMAL LOW (ref 39.0–52.0)
HCT: 23 % — ABNORMAL LOW (ref 39.0–52.0)
HCT: 20 % — ABNORMAL LOW (ref 39.0–52.0)
HCT: 18 % — ABNORMAL LOW (ref 39.0–52.0)
Hemoglobin: 9.2 g/dL — ABNORMAL LOW (ref 13.0–17.0)
Hemoglobin: 6.5 g/dL — CL (ref 13.0–17.0)
Hemoglobin: 7.1 g/dL — ABNORMAL LOW (ref 13.0–17.0)
Hemoglobin: 9.2 g/dL — ABNORMAL LOW (ref 13.0–17.0)
Hemoglobin: 11.2 g/dL — ABNORMAL LOW (ref 13.0–17.0)
Hemoglobin: 11.6 g/dL — ABNORMAL LOW (ref 13.0–17.0)
Hemoglobin: 7.1 g/dL — ABNORMAL LOW (ref 13.0–17.0)
Hemoglobin: 8.5 g/dL — ABNORMAL LOW (ref 13.0–17.0)
Hemoglobin: 9.5 g/dL — ABNORMAL LOW (ref 13.0–17.0)
Hemoglobin: 7.8 g/dL — ABNORMAL LOW (ref 13.0–17.0)
Hemoglobin: 6.8 g/dL — CL (ref 13.0–17.0)
Hemoglobin: 6.1 g/dL — CL (ref 13.0–17.0)
O2 Saturation: 100 %
O2 Saturation: 100 %
O2 Saturation: 100 %
O2 Saturation: 100 %
O2 Saturation: 82 %
O2 Saturation: 99 %
O2 Saturation: 100 %
O2 Saturation: 100 %
O2 Saturation: 100 %
O2 Saturation: 100 %
O2 Saturation: 100 %
O2 Saturation: 100 %
Patient temperature: 36.4
Patient temperature: 36.4
Patient temperature: 33.7
Patient temperature: 34
Patient temperature: 36.8
Patient temperature: 34.7
Patient temperature: 36
Patient temperature: 36.2
Patient temperature: 36.3
Patient temperature: 36.6
Patient temperature: 36.6
Potassium: 4 mmol/L (ref 3.5–5.1)
Potassium: 4 mmol/L (ref 3.5–5.1)
Potassium: 3.7 mmol/L (ref 3.5–5.1)
Potassium: 3.7 mmol/L (ref 3.5–5.1)
Potassium: 4.1 mmol/L (ref 3.5–5.1)
Potassium: 5.9 mmol/L — ABNORMAL HIGH (ref 3.5–5.1)
Potassium: 3.7 mmol/L (ref 3.5–5.1)
Potassium: 3.7 mmol/L (ref 3.5–5.1)
Potassium: 3.8 mmol/L (ref 3.5–5.1)
Potassium: 3.9 mmol/L (ref 3.5–5.1)
Potassium: 3.9 mmol/L (ref 3.5–5.1)
Potassium: 4 mmol/L (ref 3.5–5.1)
Sodium: 138 mmol/L (ref 135–145)
Sodium: 139 mmol/L (ref 135–145)
Sodium: 138 mmol/L (ref 135–145)
Sodium: 140 mmol/L (ref 135–145)
Sodium: 134 mmol/L — ABNORMAL LOW (ref 135–145)
Sodium: 137 mmol/L (ref 135–145)
Sodium: 138 mmol/L (ref 135–145)
Sodium: 140 mmol/L (ref 135–145)
Sodium: 140 mmol/L (ref 135–145)
Sodium: 140 mmol/L (ref 135–145)
Sodium: 139 mmol/L (ref 135–145)
Sodium: 138 mmol/L (ref 135–145)
TCO2: 32 mmol/L (ref 22–32)
TCO2: 33 mmol/L — ABNORMAL HIGH (ref 22–32)
TCO2: 26 mmol/L (ref 22–32)
TCO2: 29 mmol/L (ref 22–32)
TCO2: 21 mmol/L — ABNORMAL LOW (ref 22–32)
TCO2: 24 mmol/L (ref 22–32)
TCO2: 30 mmol/L (ref 22–32)
TCO2: 31 mmol/L (ref 22–32)
TCO2: 31 mmol/L (ref 22–32)
TCO2: 32 mmol/L (ref 22–32)
TCO2: 31 mmol/L (ref 22–32)
TCO2: 31 mmol/L (ref 22–32)
pCO2 arterial: 51.8 mmHg — ABNORMAL HIGH (ref 32–48)
pCO2 arterial: 60 mmHg — ABNORMAL HIGH (ref 32–48)
pCO2 arterial: 32.9 mmHg (ref 32–48)
pCO2 arterial: 37.6 mmHg (ref 32–48)
pCO2 arterial: 36.6 mmHg (ref 32–48)
pCO2 arterial: 55.9 mmHg — ABNORMAL HIGH (ref 32–48)
pCO2 arterial: 34.1 mmHg (ref 32–48)
pCO2 arterial: 47.2 mmHg (ref 32–48)
pCO2 arterial: 52.4 mmHg — ABNORMAL HIGH (ref 32–48)
pCO2 arterial: 47.2 mmHg (ref 32–48)
pCO2 arterial: 33.6 mmHg (ref 32–48)
pCO2 arterial: 41.5 mmHg (ref 32–48)
pH, Arterial: 7.368 (ref 7.35–7.45)
pH, Arterial: 7.322 — ABNORMAL LOW (ref 7.35–7.45)
pH, Arterial: 7.481 — ABNORMAL HIGH (ref 7.35–7.45)
pH, Arterial: 7.462 — ABNORMAL HIGH (ref 7.35–7.45)
pH, Arterial: 7.142 — CL (ref 7.35–7.45)
pH, Arterial: 7.384 (ref 7.35–7.45)
pH, Arterial: 7.358 (ref 7.35–7.45)
pH, Arterial: 7.396 (ref 7.35–7.45)
pH, Arterial: 7.523 — ABNORMAL HIGH (ref 7.35–7.45)
pH, Arterial: 7.412 (ref 7.35–7.45)
pH, Arterial: 7.55 — ABNORMAL HIGH (ref 7.35–7.45)
pH, Arterial: 7.458 — ABNORMAL HIGH (ref 7.35–7.45)
pO2, Arterial: 576 mmHg — ABNORMAL HIGH (ref 83–108)
pO2, Arterial: 597 mmHg — ABNORMAL HIGH (ref 83–108)
pO2, Arterial: 440 mmHg — ABNORMAL HIGH (ref 83–108)
pO2, Arterial: 472 mmHg — ABNORMAL HIGH (ref 83–108)
pO2, Arterial: 114 mmHg — ABNORMAL HIGH (ref 83–108)
pO2, Arterial: 60 mmHg — ABNORMAL LOW (ref 83–108)
pO2, Arterial: 505 mmHg — ABNORMAL HIGH (ref 83–108)
pO2, Arterial: 519 mmHg — ABNORMAL HIGH (ref 83–108)
pO2, Arterial: 535 mmHg — ABNORMAL HIGH (ref 83–108)
pO2, Arterial: 528 mmHg — ABNORMAL HIGH (ref 83–108)
pO2, Arterial: 501 mmHg — ABNORMAL HIGH (ref 83–108)
pO2, Arterial: 441 mmHg — ABNORMAL HIGH (ref 83–108)

## 2021-09-30 LAB — HEMOGLOBIN AND HEMATOCRIT, BLOOD
HCT: 22.4 % — ABNORMAL LOW (ref 39.0–52.0)
Hemoglobin: 8 g/dL — ABNORMAL LOW (ref 13.0–17.0)

## 2021-09-30 LAB — GLUCOSE, CAPILLARY
Glucose-Capillary: 103 mg/dL — ABNORMAL HIGH (ref 70–99)
Glucose-Capillary: 103 mg/dL — ABNORMAL HIGH (ref 70–99)
Glucose-Capillary: 103 mg/dL — ABNORMAL HIGH (ref 70–99)
Glucose-Capillary: 106 mg/dL — ABNORMAL HIGH (ref 70–99)
Glucose-Capillary: 113 mg/dL — ABNORMAL HIGH (ref 70–99)
Glucose-Capillary: 115 mg/dL — ABNORMAL HIGH (ref 70–99)
Glucose-Capillary: 133 mg/dL — ABNORMAL HIGH (ref 70–99)
Glucose-Capillary: 137 mg/dL — ABNORMAL HIGH (ref 70–99)
Glucose-Capillary: 144 mg/dL — ABNORMAL HIGH (ref 70–99)
Glucose-Capillary: 148 mg/dL — ABNORMAL HIGH (ref 70–99)
Glucose-Capillary: 161 mg/dL — ABNORMAL HIGH (ref 70–99)
Glucose-Capillary: 190 mg/dL — ABNORMAL HIGH (ref 70–99)
Glucose-Capillary: 94 mg/dL (ref 70–99)

## 2021-09-30 LAB — FIBRINOGEN: Fibrinogen: 250 mg/dL (ref 210–475)

## 2021-09-30 LAB — PREPARE FRESH FROZEN PLASMA: Unit division: 0

## 2021-09-30 LAB — HEPATIC FUNCTION PANEL
ALT: 23 U/L (ref 0–44)
AST: 94 U/L — ABNORMAL HIGH (ref 15–41)
Albumin: 2.9 g/dL — ABNORMAL LOW (ref 3.5–5.0)
Alkaline Phosphatase: 31 U/L — ABNORMAL LOW (ref 38–126)
Bilirubin, Direct: 1.1 mg/dL — ABNORMAL HIGH (ref 0.0–0.2)
Indirect Bilirubin: 1.1 mg/dL — ABNORMAL HIGH (ref 0.3–0.9)
Total Bilirubin: 2.2 mg/dL — ABNORMAL HIGH (ref 0.3–1.2)
Total Protein: 3.9 g/dL — ABNORMAL LOW (ref 6.5–8.1)

## 2021-09-30 LAB — BPAM PLATELET PHERESIS
Blood Product Expiration Date: 202308212359
Blood Product Expiration Date: 202308232359
ISSUE DATE / TIME: 202308201703
ISSUE DATE / TIME: 202308210852
Unit Type and Rh: 600
Unit Type and Rh: 6200

## 2021-09-30 LAB — POCT I-STAT, CHEM 8
BUN: 37 mg/dL — ABNORMAL HIGH (ref 8–23)
Calcium, Ion: 1.06 mmol/L — ABNORMAL LOW (ref 1.15–1.40)
Chloride: 99 mmol/L (ref 98–111)
Creatinine, Ser: 2.6 mg/dL — ABNORMAL HIGH (ref 0.61–1.24)
Glucose, Bld: 163 mg/dL — ABNORMAL HIGH (ref 70–99)
HCT: 30 % — ABNORMAL LOW (ref 39.0–52.0)
Hemoglobin: 10.2 g/dL — ABNORMAL LOW (ref 13.0–17.0)
Potassium: 3.6 mmol/L (ref 3.5–5.1)
Sodium: 140 mmol/L (ref 135–145)
TCO2: 26 mmol/L (ref 22–32)

## 2021-09-30 LAB — BPAM FFP
Blood Product Expiration Date: 202308242359
Blood Product Expiration Date: 202308242359
Blood Product Expiration Date: 202308242359
Blood Product Expiration Date: 202308242359
Blood Product Expiration Date: 202308242359
Blood Product Expiration Date: 202308242359
ISSUE DATE / TIME: 202308201703
ISSUE DATE / TIME: 202308201703
ISSUE DATE / TIME: 202308201721
ISSUE DATE / TIME: 202308201721
ISSUE DATE / TIME: 202308201824
ISSUE DATE / TIME: 202308201957
Unit Type and Rh: 5100
Unit Type and Rh: 5100
Unit Type and Rh: 5100
Unit Type and Rh: 5100
Unit Type and Rh: 5100
Unit Type and Rh: 5100

## 2021-09-30 LAB — CBC
HCT: 25.2 % — ABNORMAL LOW (ref 39.0–52.0)
HCT: 28.8 % — ABNORMAL LOW (ref 39.0–52.0)
HCT: 21.4 % — ABNORMAL LOW (ref 39.0–52.0)
HCT: 23.2 % — ABNORMAL LOW (ref 39.0–52.0)
Hemoglobin: 8.4 g/dL — ABNORMAL LOW (ref 13.0–17.0)
Hemoglobin: 10.1 g/dL — ABNORMAL LOW (ref 13.0–17.0)
Hemoglobin: 7.3 g/dL — ABNORMAL LOW (ref 13.0–17.0)
Hemoglobin: 8.1 g/dL — ABNORMAL LOW (ref 13.0–17.0)
MCH: 28 pg (ref 26.0–34.0)
MCH: 28.6 pg (ref 26.0–34.0)
MCH: 28.6 pg (ref 26.0–34.0)
MCH: 29.5 pg (ref 26.0–34.0)
MCHC: 33.3 g/dL (ref 30.0–36.0)
MCHC: 35.1 g/dL (ref 30.0–36.0)
MCHC: 34.1 g/dL (ref 30.0–36.0)
MCHC: 34.9 g/dL (ref 30.0–36.0)
MCV: 84 fL (ref 80.0–100.0)
MCV: 81.6 fL (ref 80.0–100.0)
MCV: 83.9 fL (ref 80.0–100.0)
MCV: 84.4 fL (ref 80.0–100.0)
Platelets: 158 10*3/uL (ref 150–400)
Platelets: 82 10*3/uL — ABNORMAL LOW (ref 150–400)
Platelets: 101 10*3/uL — ABNORMAL LOW (ref 150–400)
Platelets: 100 10*3/uL — ABNORMAL LOW (ref 150–400)
RBC: 3 MIL/uL — ABNORMAL LOW (ref 4.22–5.81)
RBC: 3.53 MIL/uL — ABNORMAL LOW (ref 4.22–5.81)
RBC: 2.55 MIL/uL — ABNORMAL LOW (ref 4.22–5.81)
RBC: 2.75 MIL/uL — ABNORMAL LOW (ref 4.22–5.81)
RDW: 16.9 % — ABNORMAL HIGH (ref 11.5–15.5)
RDW: 15 % (ref 11.5–15.5)
RDW: 15 % (ref 11.5–15.5)
RDW: 14.9 % (ref 11.5–15.5)
WBC: 13.7 10*3/uL — ABNORMAL HIGH (ref 4.0–10.5)
WBC: 11.6 10*3/uL — ABNORMAL HIGH (ref 4.0–10.5)
WBC: 12 10*3/uL — ABNORMAL HIGH (ref 4.0–10.5)
WBC: 16 10*3/uL — ABNORMAL HIGH (ref 4.0–10.5)
nRBC: 0.7 % — ABNORMAL HIGH (ref 0.0–0.2)
nRBC: 0.4 % — ABNORMAL HIGH (ref 0.0–0.2)
nRBC: 1.3 % — ABNORMAL HIGH (ref 0.0–0.2)
nRBC: 1.3 % — ABNORMAL HIGH (ref 0.0–0.2)

## 2021-09-30 LAB — PREPARE RBC (CROSSMATCH)

## 2021-09-30 LAB — PREPARE CRYOPRECIPITATE
Unit division: 0
Unit division: 0

## 2021-09-30 LAB — BPAM CRYOPRECIPITATE
Blood Product Expiration Date: 202308202302
Blood Product Expiration Date: 202308210152
ISSUE DATE / TIME: 202308201719
ISSUE DATE / TIME: 202308202009
Unit Type and Rh: 5100
Unit Type and Rh: 5100

## 2021-09-30 LAB — MAGNESIUM: Magnesium: 1.7 mg/dL (ref 1.7–2.4)

## 2021-09-30 LAB — APTT
aPTT: 37 seconds — ABNORMAL HIGH (ref 24–36)
aPTT: 41 seconds — ABNORMAL HIGH (ref 24–36)
aPTT: 40 seconds — ABNORMAL HIGH (ref 24–36)
aPTT: 40 seconds — ABNORMAL HIGH (ref 24–36)

## 2021-09-30 LAB — PREPARE PLATELET PHERESIS
Unit division: 0
Unit division: 0

## 2021-09-30 LAB — PROTIME-INR
INR: 1.8 — ABNORMAL HIGH (ref 0.8–1.2)
Prothrombin Time: 20.4 seconds — ABNORMAL HIGH (ref 11.4–15.2)

## 2021-09-30 LAB — HEPARIN LEVEL (UNFRACTIONATED)
Heparin Unfractionated: >1.1 IU/mL — ABNORMAL HIGH (ref 0.30–0.70)
Heparin Unfractionated: >1.1 IU/mL — ABNORMAL HIGH (ref 0.30–0.70)
Heparin Unfractionated: >1.1 IU/mL — ABNORMAL HIGH (ref 0.30–0.70)
Heparin Unfractionated: >1.1 IU/mL — ABNORMAL HIGH (ref 0.30–0.70)

## 2021-09-30 LAB — VANCOMYCIN, RANDOM: Vancomycin Rm: 16 ug/mL

## 2021-09-30 LAB — PHOSPHORUS: Phosphorus: 4.6 mg/dL (ref 2.5–4.6)

## 2021-09-30 LAB — LACTIC ACID, PLASMA: Lactic Acid, Venous: 1.4 mmol/L (ref 0.5–1.9)

## 2021-09-30 LAB — LACTATE DEHYDROGENASE: LDH: 166 U/L (ref 98–192)

## 2021-09-30 SURGERY — EXPLORATION POST OPERATIVE OPEN HEART
Anesthesia: General | Site: Chest

## 2021-09-30 MED ORDER — SODIUM CHLORIDE 0.9% IV SOLUTION: Freq: Once | INTRAVENOUS | Status: AC

## 2021-09-30 MED ORDER — PIVOT 1.5 CAL PO LIQD
1000.0000 mL | ORAL | Status: DC
  Administered 2021-09-30 – 2021-10-01 (×2): 1000 mL

## 2021-09-30 MED ORDER — ALBUMIN HUMAN 5 % IV SOLN
12.5000 g | INTRAVENOUS | Status: DC | PRN
  Administered 2021-09-30: 12.5 g via INTRAVENOUS
  Filled 2021-09-30 (×4): qty 250

## 2021-09-30 MED ORDER — PROPOFOL 10 MG/ML IV BOLUS
INTRAVENOUS | Status: DC | PRN
  Administered 2021-09-30: 25 mg via INTRAVENOUS
  Administered 2021-09-30: 20 mg via INTRAVENOUS

## 2021-09-30 MED ORDER — VANCOMYCIN HCL IN DEXTROSE 1-5 GM/200ML-% IV SOLN
1000.0000 mg | INTRAVENOUS | Status: DC
  Administered 2021-09-30 – 2021-10-03 (×4): 1000 mg via INTRAVENOUS
  Filled 2021-09-30 (×4): qty 200

## 2021-09-30 MED ORDER — COAGULATION FACTOR VIIA RECOMB 1 MG IV SOLR
9.0000 mg | INTRAVENOUS | Status: AC
  Administered 2021-09-30: 9 mg via INTRAVENOUS
  Filled 2021-09-30: qty 4

## 2021-09-30 MED ORDER — SODIUM CHLORIDE 0.9% IV SOLUTION
Freq: Once | INTRAVENOUS | Status: AC
Start: 2021-09-30 — End: 2021-09-30

## 2021-09-30 MED ORDER — ATORVASTATIN CALCIUM 80 MG PO TABS
80.0000 mg | ORAL_TABLET | Freq: Every day | ORAL | Status: DC
  Administered 2021-10-01 – 2021-10-23 (×21): 80 mg
  Filled 2021-09-30 (×22): qty 1

## 2021-09-30 MED ORDER — NOREPINEPHRINE 16 MG/250ML-% IV SOLN
0.0000 ug/min | INTRAVENOUS | Status: DC
  Administered 2021-09-30: 10 ug/min via INTRAVENOUS
  Administered 2021-10-01: 18 ug/min via INTRAVENOUS
  Administered 2021-10-01: 23 ug/min via INTRAVENOUS
  Administered 2021-10-02: 5 ug/min via INTRAVENOUS
  Administered 2021-10-03: 1 ug/min via INTRAVENOUS
  Administered 2021-10-03 – 2021-10-04 (×2): 3 ug/min via INTRAVENOUS
  Administered 2021-10-04: 24 ug/min via INTRAVENOUS
  Administered 2021-10-08: 36 ug/min via INTRAVENOUS
  Administered 2021-10-08: 32 ug/min via INTRAVENOUS
  Administered 2021-10-08: 40 ug/min via INTRAVENOUS
  Administered 2021-10-08: 24 ug/min via INTRAVENOUS
  Administered 2021-10-09: 10 ug/min via INTRAVENOUS
  Filled 2021-09-30 (×14): qty 250

## 2021-09-30 MED ORDER — PROPOFOL 500 MG/50ML IV EMUL
INTRAVENOUS | Status: DC | PRN
  Administered 2021-09-30: 50 ug/kg/min via INTRAVENOUS

## 2021-09-30 MED ORDER — MAGNESIUM SULFATE 4 GM/100ML IV SOLN
4.0000 g | Freq: Once | INTRAVENOUS | Status: AC
  Administered 2021-09-30: 4 g via INTRAVENOUS
  Filled 2021-09-30: qty 100

## 2021-09-30 MED ORDER — CALCIUM CHLORIDE 10 % IV SOLN
INTRAVENOUS | Status: DC | PRN
  Administered 2021-09-30 (×5): 200 mg via INTRAVENOUS

## 2021-09-30 MED ORDER — ORAL CARE MOUTH RINSE: 15.0000 mL | OROMUCOSAL | Status: DC | PRN

## 2021-09-30 MED ORDER — 0.9 % SODIUM CHLORIDE (POUR BTL) OPTIME
TOPICAL | Status: DC | PRN
  Administered 2021-09-30: 2000 mL

## 2021-09-30 MED ORDER — ALBUMIN HUMAN 5 % IV SOLN
12.5000 g | INTRAVENOUS | Status: DC | PRN
  Administered 2021-10-03 – 2021-10-05 (×7): 12.5 g via INTRAVENOUS
  Filled 2021-09-30 (×5): qty 250

## 2021-09-30 MED ORDER — STERILE WATER FOR IRRIGATION IR SOLN
Status: DC | PRN
  Administered 2021-09-30: 1000 mL

## 2021-09-30 MED ORDER — ALBUMIN HUMAN 5 % IV SOLN
INTRAVENOUS | Status: AC
  Filled 2021-09-30: qty 500

## 2021-09-30 MED ORDER — METOCLOPRAMIDE HCL 5 MG/ML IJ SOLN
10.0000 mg | Freq: Once | INTRAMUSCULAR | Status: AC
  Administered 2021-09-30: 10 mg via INTRAVENOUS
  Filled 2021-09-30: qty 2

## 2021-09-30 MED ORDER — PROSOURCE TF20 ENFIT COMPATIBL EN LIQD
60.0000 mL | Freq: Every day | ENTERAL | Status: DC
  Administered 2021-09-30 – 2021-10-01 (×2): 60 mL
  Filled 2021-09-30 (×2): qty 60

## 2021-09-30 MED ORDER — COAGULATION FACTOR VIIA RECOMB 1 MG IV SOLR
90.0000 ug/kg | INTRAVENOUS | Status: DC
Start: 2021-09-30 — End: 2021-09-30
  Filled 2021-09-30: qty 9

## 2021-09-30 MED ORDER — LACTATED RINGERS IV SOLN: INTRAVENOUS | Status: DC | PRN

## 2021-09-30 MED ORDER — ORAL CARE MOUTH RINSE
15.0000 mL | OROMUCOSAL | Status: DC
  Administered 2021-09-30 – 2021-10-07 (×79): 15 mL via OROMUCOSAL

## 2021-09-30 MED ORDER — SODIUM CHLORIDE 0.9 % IV SOLN
20.0000 ug | Freq: Once | INTRAVENOUS | Status: AC
  Administered 2021-09-30: 20 ug via INTRAVENOUS
  Filled 2021-09-30: qty 5

## 2021-09-30 MED ORDER — MIDAZOLAM HCL 2 MG/2ML IJ SOLN
INTRAMUSCULAR | Status: AC
  Filled 2021-09-30: qty 2

## 2021-09-30 MED ORDER — FENTANYL CITRATE (PF) 250 MCG/5ML IJ SOLN
INTRAMUSCULAR | Status: AC
  Filled 2021-09-30: qty 5

## 2021-09-30 MED ORDER — INSULIN DETEMIR 100 UNIT/ML ~~LOC~~ SOLN
5.0000 [IU] | Freq: Two times a day (BID) | SUBCUTANEOUS | Status: DC
  Administered 2021-09-30 (×2): 5 [IU] via SUBCUTANEOUS
  Filled 2021-09-30 (×3): qty 0.05

## 2021-09-30 MED ORDER — INSULIN ASPART 100 UNIT/ML IJ SOLN
2.0000 [IU] | INTRAMUSCULAR | Status: DC
  Administered 2021-09-30: 2 [IU] via SUBCUTANEOUS

## 2021-09-30 SURGICAL SUPPLY — 63 items
APL SKNCLS STERI-STRIP NONHPOA (GAUZE/BANDAGES/DRESSINGS) ×4
BENZOIN TINCTURE PRP APPL 2/3 (GAUZE/BANDAGES/DRESSINGS) IMPLANT
BLADE STERNUM SYSTEM 6 (BLADE) IMPLANT
BNDG GAUZE DERMACEA FLUFF (GAUZE/BANDAGES/DRESSINGS) ×2
BNDG GAUZE DERMACEA FLUFF 4 (GAUZE/BANDAGES/DRESSINGS) IMPLANT
BNDG GZE DERMACEA 4 6PLY (GAUZE/BANDAGES/DRESSINGS) ×2
CANISTER SUCT 3000ML PPV (MISCELLANEOUS) ×1 IMPLANT
CLIP TI MEDIUM 6 (CLIP) IMPLANT
CLIP TI WIDE RED SMALL 6 (CLIP) IMPLANT
CNTNR URN SCR LID CUP LEK RST (MISCELLANEOUS) ×1 IMPLANT
CONN ST 1/4X3/8  BEN (MISCELLANEOUS)
CONN ST 1/4X3/8 BEN (MISCELLANEOUS) IMPLANT
CONT SPEC 4OZ STRL OR WHT (MISCELLANEOUS)
DRAIN CHANNEL 19F RND (DRAIN) ×1 IMPLANT
DRAPE CARDIOVASC SPLIT 88X140 (DRAPES) IMPLANT
DRAPE INCISE 23X17 IOBAN STRL (DRAPES) ×1
DRAPE INCISE 23X17 STRL (DRAPES) IMPLANT
DRAPE INCISE IOBAN 23X17 STRL (DRAPES) ×1 IMPLANT
DRAPE INCISE IOBAN 85X60 (DRAPES) IMPLANT
DRAPE PERI GROIN 82X75IN TIB (DRAPES) IMPLANT
DRAPE WARM FLUID 44X44 (DRAPES) ×1 IMPLANT
DRSG COVADERM 4X14 (GAUZE/BANDAGES/DRESSINGS) IMPLANT
ELECT REM PT RETURN 9FT ADLT (ELECTROSURGICAL) ×1
ELECTRODE REM PT RTRN 9FT ADLT (ELECTROSURGICAL) ×1 IMPLANT
FELT TEFLON 1X6 (MISCELLANEOUS) ×1 IMPLANT
GAUZE SPONGE 4X4 12PLY STRL (GAUZE/BANDAGES/DRESSINGS) ×1 IMPLANT
GLOVE BIO SURGEON STRL SZ7.5 (GLOVE) IMPLANT
GLOVE BIOGEL PI IND STRL 6 (GLOVE) IMPLANT
GLOVE BIOGEL PI INDICATOR 6 (GLOVE) ×1
GLOVE SS BIOGEL STRL SZ 6 (GLOVE) IMPLANT
GLOVE SUPERSENSE BIOGEL SZ 6 (GLOVE) ×1
GOWN STRL REUS W/ TWL LRG LVL3 (GOWN DISPOSABLE) ×3 IMPLANT
GOWN STRL REUS W/ TWL XL LVL3 (GOWN DISPOSABLE) ×2 IMPLANT
GOWN STRL REUS W/TWL LRG LVL3 (GOWN DISPOSABLE) ×3
GOWN STRL REUS W/TWL XL LVL3 (GOWN DISPOSABLE) ×2
INSERT SUTURE HOLDER (MISCELLANEOUS) IMPLANT
KIT BASIN OR (CUSTOM PROCEDURE TRAY) ×1 IMPLANT
KIT SUCTION CATH 14FR (SUCTIONS) IMPLANT
KIT TURNOVER KIT B (KITS) ×1 IMPLANT
NS IRRIG 1000ML POUR BTL (IV SOLUTION) ×2 IMPLANT
PACK CHEST (CUSTOM PROCEDURE TRAY) ×1 IMPLANT
PAD ARMBOARD 7.5X6 YLW CONV (MISCELLANEOUS) ×2 IMPLANT
PAD ELECT DEFIB RADIOL ZOLL (MISCELLANEOUS) ×1 IMPLANT
SET IRRIG TUBING LAPAROSCOPIC (IRRIGATION / IRRIGATOR) IMPLANT
SPONGE T-LAP 18X18 ~~LOC~~+RFID (SPONGE) ×4 IMPLANT
SUT BONE WAX W31G (SUTURE) ×1 IMPLANT
SUT ETHIBOND X763 2 0 SH 1 (SUTURE) IMPLANT
SUT MNCRL AB 4-0 PS2 18 (SUTURE) IMPLANT
SUT PROLENE 4 0 RB 1 (SUTURE) ×1
SUT PROLENE 4 0 SH DA (SUTURE) IMPLANT
SUT PROLENE 4-0 RB1 .5 CRCL 36 (SUTURE) IMPLANT
SUT PROLENE 5 0 C 1 36 (SUTURE) IMPLANT
SUT SILK  1 MH (SUTURE) ×1
SUT SILK 1 MH (SUTURE) IMPLANT
SUT SILK 2 0 SH CR/8 (SUTURE) IMPLANT
SUT VICRYL 0 UR6 27IN ABS (SUTURE) IMPLANT
SYR BULB IRRIG 60ML STRL (SYRINGE) IMPLANT
SYSTEM SAHARA CHEST DRAIN ATS (WOUND CARE) IMPLANT
TOWEL GREEN STERILE (TOWEL DISPOSABLE) ×1 IMPLANT
TOWEL GREEN STERILE FF (TOWEL DISPOSABLE) ×1 IMPLANT
TRAP SPECIMEN MUCUS 40CC (MISCELLANEOUS) IMPLANT
UNDERPAD 30X36 HEAVY ABSORB (UNDERPADS AND DIAPERS) ×1 IMPLANT
WATER STERILE IRR 1000ML POUR (IV SOLUTION) ×2 IMPLANT

## 2021-09-30 NOTE — Op Note (Signed)
      [address]       [place] [address]             [phone]                                          09/30/2021 Patient:  [name] Pre-Op Dx: Hypovolemic shock Central VA ECMO cannulation   Post-op Dx:  same Procedure: Mediastinal re-exploration    Surgeon and Role:      * Emry Tobin, Lucile Crater, MD - Primary  Anesthesia  general EBL:  Per anesthesia records Blood Administration: Per anesthesia records   Drains: 17 F blake drain X 3: mediastinal 29F Blake in dressings   Counts: correct   Indications: 61yo male with open chest for VA ECMO cannulation brought back to the OR for significant bleeding from the chest.  Findings: Generalized coagulopathy  Operative Technique: The patient was brought to the operative theatre.  Anesthesia was induced, and the patient was prepped and draped in normal sterile fashion.  An appropriate surgical pause was performed, and pre-operative antibiotics were dosed accordingly.  The previous sternotomy was re-opened, and the sternal retractor was then placed.  No bleeding noted from the aortic cannulation site.  Significant clot and oozing from the lateral wall.   Chest tubes were placed, and the chest was dressed with Kerlex gauze and an [place].  A 29F blake was left in the Kerlex for added suctioning.  The patient tolerated the procedure without any immediate complications, and was transferred to the ICU in guarded condition.  [name]

## 2021-09-30 NOTE — CV Procedure (Signed)
ECMO NOTE:   Indication: Cardiac arrest   Initial cannulation date: 09/29/21   ECMO type: VA ECMO   Dual lumen inflow/return cannula:   1) 23/25 FR multistage venous return cannula in RFV 2) Central aortic arterial return   ECMO events:   - Initial cannulation 09/29/21 - Overnight developed hemodynamic compromise in the setting of ongoing chest bleeding. Taken emergently to the OR early this am.  - Now back in ICU. Bleeding has essentially resolved. Making urine. MAPs stable on low dose pressors. - Circuit working well. Off heparin  Patient seen on evening rounds. Intubated/sedated. On minimal pressor support. MAPs 70s. Bleeding resolved. Making 60-70cc urine/hour   Exam: General:  Intubated/sedated HEENT: normal + ETT Neck: supple. Bilateral central lines Carotids 2+ bilat; no bruits. No lymphadenopathy or thryomegaly appreciated. Cor: Chest open with aortic cannula Lungs: clear Abdomen: soft, nontender, nondistended. No hepatosplenomegaly. No bruits or masses. Good bowel sounds. Extremities: no cyanosis, clubbing, rash, edema RFV ecmo cannula Neuro: intubated/sedated   Daily data:   Flow 3.7 L RPM 3000 Sweep  4 L   Labs:   ABG    Component Value Date/Time   PHART 7.412 09/30/2021 1322   PCO2ART 47.2 09/30/2021 1322   PO2ART 528 (H) 09/30/2021 1322   HCO3 30.2 (H) 09/30/2021 1322   TCO2 32 09/30/2021 1322   ACIDBASEDEF 5.0 (H) 09/29/2021 1443   O2SAT 100 09/30/2021 1322      Plan:  - Much improved after return to OR - Bleeding improved. Continue to hold heparin - Adjust pressors. Keep MAP 70-90 - Continue rest settings on vent - Rest on ECMO x 48 hours to allow for end-organ recovery - Echo tomorrow     Discussed in multidisciplinary fashion with CCM, TCTS, Cardiology, ECMO coordinator/specialist, RT, PharmD and nursing staff all present.    Additional CCT 45 mins on evening rounds.   Glori Bickers, MD  7:39 PM

## 2021-09-30 NOTE — Consult Note (Signed)
[facility] Nurse Consult Note: Patient receiving care in [place]. Primary care RNs present at time of my arrival for RLE wound eval. Reason for Consult: open wound on RLE Wound type: former healed surgical incision site. One of the primary RNs explained that they had gotten in touch with the [facility], and he has given them orders for dressing changes to the site.  No further needs identified at this time. [facility] nurse will not follow at this time.  Please re-consult the [facility] team if needed.  Val Riles, RN, MSN, CWOCN, CNS-BC, pager [phone]

## 2021-09-30 NOTE — Progress Notes (Signed)
Patient transported from OR to [facility] on vent and ECMO circuit without complications.

## 2021-09-30 NOTE — Progress Notes (Signed)
LATE ENTRY:approx 0430 pt became increasingly unstable(low BP sys in the 50's with a narrowing pulse pressure), Dr Haroldine Laws was notified along with Dr lightfoot of TCTS, During this peroid flow was markedly decreased on the ECMO. During this period fluid  was given to the pt in the form of albumin and PRBC,s. This therapy increased the patients Sys BP (100's to 120's ) but flows on the ECMO still remained low. Upon MD's arrival the decision was made to take the patient to OR, Pt transported to OR without incident. Report given to CRNA

## 2021-09-30 NOTE — Consult Note (Signed)
NAME:  [name], MRN:  [id number], DOB:  1960-10-02, LOS: 2 ADMISSION DATE:  09/28/2021, CONSULTATION DATE:  09/29/21 REFERRING MD:  Dr. Kipp Brood, CHIEF COMPLAINT:  Cardiac Arrest   History of Present Illness:  [name] is a 61 year old male with DMII, , CVA, atrial fibrillation on eliquis and multivessel CAD s/p 5 vessel CABG on 09/12/21 who presented 8/19 with shortness of breath via EMS. He was found to be bradycardic and hypoglycemic. He symptomatically improved with dextrose but continued to have bradycardia and soft blood pressures. ECHO obtained which showed LVEF 50%, grade II diastolic dysfunction and moderate pericardial effusion with possible tamponade physiology. CT Chest showed pericardial effusion and small bilateral pleural effusions. He was taken to the OR 8/19 for pericardial window.   Patient started having issues with tachy-brady arrhythmias on telemetry, decreased urine output and increased work of breathing over night. A 14 french pigtail catheter was placed in the left pleural space this morning with bloody drainage. At 10:35am patient became unresponsive with PEA arrest. He underwent multiple rounds of CPR with epinephrine pushes. ROSC was obtained and patient was intubated. Bedside US by cardiology showed large pericardial effusion with tamponade physiology. He lost pulse again and CPR was again performed. Please see code sheet for full details. Bedside sternotomy performed with pericardial drainage by CT surgery, immediate improvement in hemodynamics achieved with drainage of the pericardial space.   Pertinent  Medical History   Past Medical History:  Diagnosis Date   AKI (acute kidney injury) ([place])    pt unaware of this   Anginal pain ([place])    CAD (coronary artery disease)    a. 03/2015 NSTEMI: LHC with severe 3V CAD  (70% mid RCA, 95% OM1, 90% distal LCx, 90% OM3, 80% prox LAD and 90% ost D1) s/p DES to mLAD w/ small dissction Rx with DES, staged ost Ramus PCI/DES and  dLCx s/p PCI/DES    Chest pain 12/24/2020   Diabetes mellitus type 2 in obese [facility])    Diverticulosis    Dyspnea    Dyspnea on exertion 03/16/2015   Dyspnea on exertion   Family history of adverse reaction to anesthesia    patient father- pt states after anesthesia his father "developed dementia"   GERD (gastroesophageal reflux disease)    Hypercholesteremia    Hypertension associated with diabetes ([place]) 03/16/2015   hypertension   NSTEMI (non-ST elevated myocardial infarction) ([place]) 03/17/2015   Obesity    Stroke ([place]) 2022   pt states he had a "mini stroke" during cardiac catheterization   Tobacco abuse    Significant Hospital Events: Including procedures, antibiotic start and stop dates in addition to other pertinent events   8/19 admitted, s/p pericardial window 8/20 PEA cardiac arrest, left pigtail chest tube placement, PEA cardiac arrest due to tamponade, bedside sternotomy with pericardial drains placed. 8/21 return to OR for overnight bleeding.   Interim History / Subjective:   Generalized oozing seen in OR. Improved with factor VII.   Objective   Blood pressure (!) 148/94, pulse (!) 55, temperature (!) 97.5 F (36.4 C), temperature source Core, resp. rate 11, height 5\' 7"  (1.702 m), weight 102 kg, SpO2 100 %. CVP:  [8 mmHg-16 mmHg] 14 mmHg  Vent Mode: PRVC FiO2 (%):  [40 %-100 %] 40 % Set Rate:  [10 bmp-24 bmp] 10 bmp Vt Set:  [390 mL-520 mL] 390 mL PEEP:  [10 cmH20] 10 cmH20 Plateau Pressure:  [20 cmH20-30 cmH20] 20 cmH20  Intake/Output Summary (Last 24 hours) at 09/30/2021 2694 Last data filed at 09/30/2021 8546 Gross per 24 hour  Intake 4018.67 ml  Output 1740 ml  Net 2278.67 ml    Filed Weights   09/28/21 0430 09/29/21 0500  Weight: 101 kg 102 kg   Examination: General: ill appearing male, intubated, sedated HENT: [place]/AT, moist mucous membranes, sclera anicteric Lungs: course breath sounds, left chest tube in place, no air leak noted Cardiovascular:  rrr, pericardial drains in place, open sternotomy wound with surgical dressing in place Abdomen: soft, bowel sounds dminished Extremities: cool to touch, no edema Neuro: sedated GU: foley in place  Ancillary tests personally review:  Post OR ABG pCO2 25.  WBC 20.4 INR 1.8  Assessment & Plan:  PEA Cardiac Arrest Cardiac Tamponade s/p pericardial drain placement Hemorraghic Pericarditis Obstructive Shock Coronary Artery Disease s/p CABG x 5 8/3 Atrial Fibrillation - CT surgery and Cardiology following - Obstructive shock relieved with pericardial drain placement - Continue levophed + epinephrine for MAP >65 - Amiodarone for atrial fibrillation - Aspirin 325mg  BID for pericarditis - Continue statin - Keep MAP 65-75, no more than 15 points of pulsatility to help prevent bleeding.  - Keep relatively high PEEP and I-time to help tamponade off bleeding.  - Repeat coagulation profile and correct. TEG pending.   Acute Hypoxemic and Hypercapnic Respiratory Failure - continue mechanical ventilation, lung protective strategy - Wean FiO2 for SpO2 92% or greater - PFTs in 2022 were suggestive of restrictive defect - PRN duonebs ordered  Acute Kidney 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 Hyponatremia, mild - Monitor Cr and UOP - trend electrolytes  Sepsis due to Pericarditis - treatment as above - Will broadly cover with vancomycin + cefepime due to bedside sternotomy procedure  Acute Blood Loss Anemia - Maintain hemoglobin 8-10g/dL  Hx of DMII - monitor blood sugars - SSI if needed  Best Practice (right click and "Reselect all SmartList Selections" daily)   Diet/type: NPO - Cortrak today and initiate post-pyloric feeding.  DVT prophylaxis: not indicated GI prophylaxis: PPI Lines: Central line and yes and it is still needed Foley:  Yes, and it is still needed Code Status:  full code Last date of multidisciplinary goals of care discussion [patient's friends updated 8/20]  CRITICAL  CARE Performed by: Kipp Brood   Total critical care time: 45 minutes  Critical care time was exclusive of separately billable procedures and treating other patients.  Critical care was necessary to treat or prevent imminent or life-threatening deterioration.  Critical care was time spent personally by me on the following activities: development of treatment plan with patient and/or surrogate as well as nursing, discussions with consultants, evaluation of patient's response to treatment, examination of patient, obtaining history from patient or surrogate, ordering and performing treatments and interventions, ordering and review of laboratory studies, ordering and review of radiographic studies, pulse oximetry, re-evaluation of patient's condition and participation in multidisciplinary rounds.  Kipp Brood, MD [facility] ICU Physician [place]  Pager: [phone] Mobile: [phone] After hours: [phone].

## 2021-09-30 NOTE — Progress Notes (Signed)
Cortrak Tube Team Note:  Consult received due to cortrak tube not flushing.  Adjusted tube. Cortrak remains in left nare now secured at 78 cm by nasal bridle.   X-ray is required, abdominal x-ray has been ordered by the Cortrak team. Please confirm tube placement before using the Cortrak tube.   If the tube becomes dislodged please keep the tube and contact the Cortrak team at [email or web address] ([email or web address]) for replacement.  If after hours and replacement cannot be delayed, place a NG tube and confirm placement with an abdominal x-ray.    Lockie Pares., RD, LDN, CNSC See AMiON for contact information

## 2021-09-30 NOTE — Transfer of Care (Signed)
Immediate Anesthesia Transfer of Care Note  Patient: [name]  Procedure(s) Performed: EXPLORATION POST OPERATIVE OPEN HEART WASHOUT (Chest)  Patient Location: ICU  Anesthesia Type:General  Level of Consciousness: sedated  Airway & Oxygen Therapy: Patient remains intubated per anesthesia plan and Patient placed on Ventilator (see vital sign flow sheet for setting)  Post-op Assessment: Report given to RN and Post -op Vital signs reviewed and stable  Post vital signs: Reviewed and stable  Last Vitals:  Vitals Value Taken Time  BP    Temp    Pulse 57 09/30/21 0807  Resp 10 09/30/21 0811  SpO2 100 % 09/30/21 0807  Vitals shown include unvalidated device data.  Last Pain:  Vitals:   09/30/21 0330  TempSrc: Core  PainSc:          Complications: No notable events documented.

## 2021-09-30 NOTE — Progress Notes (Signed)
CT surgery PM rounds  Patient had a stable today after chest tube output significantly diminished by midmorning. No increase in norepinephrine required today Making urine 50-60 cc/h  P.m. hemoglobin 8.1, platelet count 100 K, PTT 40 Feeding tube placed in the distal stomach this afternoon  Blood pressure (!) 81/72, pulse 73, temperature 97.9 F (36.6 C), resp. rate 14, height 5\' 7"  (1.702 m), weight 102 kg, SpO2 100 %.

## 2021-09-30 NOTE — Progress Notes (Signed)
Pharmacy Antibiotic Note  [name] is a 61 y.o. male admitted on 09/28/2021 s/p pericardial window > now with open chest/ECMO  Pharmacy has been consulted for vancomycin and meropenem dosing.  Received vancomycin 1gm prior to OR 8/19 and 1500 mg vancomycin in OR 8/20 pm > vancomycin random level 16 and Crcl approx 30ml/min   Plan: Meropenem 1g q 12 hrs for now, will adjust as needed with renal function. Vancomycin 1gm IV q24hr  - monitor renal function for dosing needs  Height: 5\' 7"  (170.2 cm) Weight: 102 kg (224 lb 13.9 oz) IBW/kg (Calculated) : 66.1  Temp (24hrs), Avg:97.2 F (36.2 C), Min:96.1 F (35.6 C), Max:98.4 F (36.9 C)  Recent Labs  Lab 09/28/21 1102 09/28/21 1605 09/29/21 1448 09/29/21 1635 09/29/21 1657 09/29/21 1719 09/29/21 1922 09/29/21 1956 09/29/21 2319 09/30/21 0715 09/30/21 0835 09/30/21 0933 09/30/21 1140  WBC  --    < >  --   --  24.8*  --   --  17.7* 13.7*  --  11.6*  --  12.0*  CREATININE  --    < > 3.40*  --   --  3.05*  --  2.92*  --  2.60* 2.48*  --   --   LATICACIDVEN 1.4  --   --  5.6*  --  4.6* 2.3*  --   --   --   --   --  1.4  VANCORANDOM  --   --   --   --   --   --   --   --   --   --   --  16  --    < > = values in this interval not displayed.     Estimated Creatinine Clearance: 35.6 mL/min (A) (by C-G formula based on SCr of 2.48 mg/dL (H)).    No Known Allergies  Antimicrobials this admission:  8/20 Vancomycin > 8/20 Meropenem >   Dose adjustments this admission:   Microbiology results:  None    Bonnita Nasuti Pharm.D. CPP, BCPS Clinical Pharmacist [phone] 09/30/2021 12:54 PM     [facility] pharmacy phone numbers are listed on [email or web address]

## 2021-09-30 NOTE — Progress Notes (Signed)
PT Cancellation Note  Patient Details Name: [name] MRN: [id number] DOB: [birth year]   Cancelled Treatment:    Reason Eval/Treat Not Completed: Patient not medically ready   Geisha Abernathy B [name] 09/30/2021, 7:08 AM [place] Office: [phone]

## 2021-09-30 NOTE — Anesthesia Postprocedure Evaluation (Signed)
Anesthesia Post Note  Patient: [name]  Procedure(s) Performed: EXPLORATION POST OPERATIVE OPEN HEART WASHOUT (Chest)     Patient location during evaluation: SICU Anesthesia Type: General Level of consciousness: sedated Pain management: pain level controlled Vital Signs Assessment: post-procedure vital signs reviewed and stable Respiratory status: patient remains intubated per anesthesia plan Cardiovascular status: stable Postop Assessment: no apparent nausea or vomiting Anesthetic complications: no   No notable events documented.  Last Vitals:  Vitals:   09/30/21 1947 09/30/21 1953  BP:    Pulse: 74 74  Resp: 12   Temp:    SpO2: 100%     Last Pain:  Vitals:   09/30/21 1600  TempSrc: Bladder  PainSc:                  Karyl Kinnier Abdoulie Tierce

## 2021-09-30 NOTE — Consult Note (Signed)
Consultation Note Date: 09/30/2021   Patient Name: [name]  DOB: Feb 19, 1960  MRN: [id number]  Age / Sex: 61 y.o., male  PCP: Mark Mai, MD Referring Physician: Lajuana Matte, MD  Reason for Consultation: Establishing goals of care and Psychosocial/spiritual support  HPI/Patient Profile: 61 y.o. male  admitted on 09/28/2021 with   DMII, , CVA, atrial fibrillation on eliquis and multivessel CAD s/p 5 vessel CABG on 09/12/21 who presented 8/19 with shortness of breath via EMS.   He was found to be bradycardic and hypoglycemic. He symptomatically improved with dextrose but continued to have bradycardia and soft blood pressures.   ECHO obtained which showed LVEF 50%, grade II diastolic dysfunction and moderate pericardial effusion with possible tamponade physiology.   CT Chest showed pericardial effusion and small bilateral pleural effusions. He was taken to the OR 8/19 for pericardial window.   Patient started having issues with tachy-brady arrhythmias on telemetry, decreased urine output and increased work of breathing over night. At 10:35am patient became unresponsive with PEA arrest.   He underwent multiple rounds of CPR with epinephrine pushes. ROSC was obtained and patient was intubated. Bedside US by cardiology showed large pericardial effusion with tamponade physiology. He lost pulse again and CPR was again performed.    Bedside sternotomy performed with pericardial drainage by CT surgery, immediate improvement in hemodynamics achieved with drainage of the pericardial space.   Patient is currently intubated, ECMO in place, he is critically ill  Critical condition, family face treatment option decisions, advanced directive decisions and anticipatory care needs.   Clinical Assessment and Goals of Care:  This NP [name] reviewed medical records, received report from team, assessed the  patient and attempted to contact family in order to communicate current medical situation, and secure support persons and decision makers for [name].  Initially I spoke to / listed contact/ [name]/uncle, he made it clear that he did not want to be involved in decisions for [name] and directed me to speak with [name] who would be able to put me in contact with the patient's wife.  Indeed I was able to get in touch with [name].  Mark lets me know that she and [name] are separated currently however they have remained friends and she is willing to participate and support [name] in any way she needs to.  [name]'s  information placed in demographics   Concept of Palliative Care was introduced as specialized medical care for people and their families living with serious illness.  If focuses on providing relief from the symptoms and stress of a serious illness.  The goal is to improve quality of life for both the patient and the family. Values and goals of care important to patient and family were attempted to be elicited.    Education offered on the seriousness. of the patient's current medical situation.  Patient is receiving full medical support, all are hoping for improvement  Questions and concerns addressed.  Family and friends are encouraged to  call with questions or concerns.     PMT will continue to support holistically.          No documented H POA.  As noted above although patient and his wife are separated, wife is willing to support patient anyway she can    SUMMARY OF RECOMMENDATIONS    Code Status/Advance Care Planning: Full code Family and friends are hopeful and open to all offered and available medical interventions to prolong life.  Hope is for improvement and return to baseline      Primary Diagnoses: Present on Admission:  S/P pericardial window creation   I have reviewed the medical record, interviewed the patient and family, and  examined the patient. The following aspects are pertinent.  Past Medical History:  Diagnosis Date   AKI (acute kidney injury) ([place])    pt unaware of this   Anginal pain ([place])    CAD (coronary artery disease)    a. 03/2015 NSTEMI: LHC with severe 3V CAD  (70% mid RCA, 95% OM1, 90% distal LCx, 90% OM3, 80% prox LAD and 90% ost D1) s/p DES to mLAD w/ small dissction Rx with DES, staged ost Ramus PCI/DES and dLCx s/p PCI/DES    Chest pain 12/24/2020   Diabetes mellitus type 2 in obese [facility])    Diverticulosis    Dyspnea    Dyspnea on exertion 03/16/2015   Dyspnea on exertion   Family history of adverse reaction to anesthesia    patient father- pt states after anesthesia his father "developed dementia"   GERD (gastroesophageal reflux disease)    Hypercholesteremia    Hypertension associated with diabetes ([place]) 03/16/2015   hypertension   NSTEMI (non-ST elevated myocardial infarction) ([place]) 03/17/2015   Obesity    Stroke ([place]) 2022   pt states he had a "mini stroke" during cardiac catheterization   Tobacco abuse    Social History   Socioeconomic History   Marital status: Legally Separated    Spouse name: Not on file   Number of children: 0   Years of education: Not on file   Highest education level: Not on file  Occupational History   Not on file  Tobacco Use   Smoking status: Former    Packs/day: 0.50    Types: Cigarettes    Quit date: 2017    Years since quitting: 6.6   Smokeless tobacco: Never  Vaping Use   Vaping Use: Some days   Start date: 10/14/2017   Last attempt to quit: 09/09/2021  Substance and Sexual Activity   Alcohol use: Yes    Comment: very occasional, maybe a beer or mixed drink once every few months   Drug use: Not Currently   Sexual activity: Not on file  Other Topics Concern   Not on file  Social History Narrative   Not on file   Social Determinants of Health   Financial Resource Strain: Not on file  Food Insecurity: Not on file  Transportation  Needs: Not on file  Physical Activity: Not on file  Stress: Not on file  Social Connections: Not on file   History reviewed. No pertinent family history. Scheduled Meds:  sodium chloride   Intravenous Once   sodium chloride   Intravenous Once   sodium chloride   Intravenous Once   sodium chloride   Intravenous Once   sodium chloride   Intravenous Once   amiodarone  200 mg Oral Daily   aspirin EC  325 mg Oral BID   Or  aspirin  324 mg Per Tube BID   atorvastatin  80 mg Oral Daily   bisacodyl  10 mg Oral Daily   Or   bisacodyl  10 mg Rectal Daily   Chlorhexidine Gluconate Cloth  6 each Topical Daily   docusate  100 mg Per Tube BID   fentaNYL (SUBLIMAZE) injection  50 mcg Intravenous Once   mouth rinse  15 mL Mouth Rinse Q2H   pantoprazole (PROTONIX) IV  40 mg Intravenous QHS   polyethylene glycol  17 g Per Tube Daily   sodium chloride flush  3 mL Intravenous Q12H   vancomycin (VANCOCIN) 1,000 mg in sodium chloride 0.9 % 1,000 mL irrigation   Irrigation To OR   Continuous Infusions:  sodium chloride     sodium chloride     albumin human     amiodarone Stopped (09/29/21 1417)   desmopressin (DDAVP) 20 mcg in sodium chloride 0.9 % 50 mL IVPB 20 mcg (09/30/21 0951)   dextrose 5 % and 0.45% NaCl     epinephrine Stopped (09/30/21 0818)   fentaNYL infusion INTRAVENOUS 100 mcg/hr (09/30/21 0900)   insulin 1.7 Units/hr (09/30/21 0906)   meropenem (MERREM) IV 200 mL/hr at 09/30/21 0900   midazolam 4 mg/hr (09/30/21 0900)   norepinephrine (LEVOPHED) Adult infusion 11 mcg/min (09/30/21 0900)   PRN Meds:.acetaminophen, albumin human, dextrose, fentaNYL, ipratropium-albuterol, metoprolol tartrate, midazolam, midazolam, midazolam, nitroGLYCERIN, ondansetron (ZOFRAN) IV, mouth rinse, oxyCODONE, sodium chloride flush, traMADol Medications Prior to Admission:  Prior to Admission medications   Medication Sig Start Date End Date Taking? Authorizing Provider  amiodarone (PACERONE) 200 MG  tablet Take 1 tablet by mouth  twice daily for 2 days then take 1 tablet daily thereafter Patient taking differently: Take 200 mg by mouth daily. 09/20/21  Yes Stehler, Pricilla Larsson, PA-C  amLODipine (NORVASC) 10 MG tablet Take 1 tablet (10 mg total) by mouth daily. 08/21/21  Yes Charlott Rakes, MD  apixaban (ELIQUIS) 5 MG TABS tablet Take 1 tablet (5 mg total) by mouth 2 (two) times daily. 09/20/21  Yes Stehler, Pricilla Larsson, PA-C  aspirin 325 MG tablet Take 325 mg by mouth daily.   Yes [provider]  atorvastatin (LIPITOR) 80 MG tablet Take 1 tablet (80 mg total) by mouth daily. 09/06/21  Yes Charlott Rakes, MD  carvedilol (COREG) 25 MG tablet Take 1 tablet (25 mg total) by mouth 2 (two) times daily with a meal. 09/20/21  Yes Stehler, Pricilla Larsson, PA-C  Dulaglutide (TRULICITY) 1.5 YY/4.8GN SOPN Inject 1.5 mg into the skin once a week. 06/10/21  Yes Charlott Rakes, MD  ferrous sulfate 325 (65 FE) MG tablet Take 1 tablet (325 mg total) by mouth daily with breakfast. Please take for 1 month, may stop sooner if develop constipation 09/20/21 09/20/22 Yes Stehler, Pricilla Larsson, PA-C  Insulin Glargine (BASAGLAR KWIKPEN) 100 UNIT/ML Inject 62 Units into the skin daily. Patient taking differently: Inject 71 Units into the skin daily. 09/10/21  Yes Mark Mai, MD  oxyCODONE (OXY IR/ROXICODONE) 5 MG immediate release tablet Take 1 tablet (5 mg total) by mouth every 6 (six) hours as needed for severe pain. 09/20/21  Yes Stehler, Pricilla Larsson, PA-C  Semaglutide, 1 MG/DOSE, 4 MG/3ML SOPN Inject 1 mg as directed once a week. 07/12/21  Yes Charlott Rakes, MD  aspirin EC 81 MG tablet Take 1 tablet (81 mg total) by mouth daily. Swallow whole. Patient not taking: Reported on 09/28/2021 09/20/21   Wynelle Beckmann C, PA-C  Continuous Blood Gluc  Sensor (FREESTYLE LIBRE 2 SENSOR) MISC Utilize as directed q 14 days to monitor blood sugar. 05/10/21   Charlott Rakes, MD  glucose blood (TRUE METRIX BLOOD GLUCOSE TEST) test strip Use to  check blood sugar three times daily. 06/10/21   Charlott Rakes, MD  Insulin Pen Needle (TRUEPLUS 5-BEVEL PEN NEEDLES) 32G X 4 MM MISC Use to inject Basaglar once daily. 05/10/21   Charlott Rakes, MD  TRUEplus Lancets 28G MISC Use to check blood sugar three times daily. 06/10/21   Charlott Rakes, MD   No Known Allergies Review of Systems  Unable to perform ROS: Acuity of condition    Physical Exam Constitutional:      Appearance: He is normal weight. He is ill-appearing.     Interventions: He is intubated.     Comments: ECMO  Pulmonary:     Effort: He is intubated.     Vital Signs: BP (!) 148/94   Pulse (!) 58   Temp (!) 96.8 F (36 C)   Resp 11   Ht 5\' 7"  (1.702 m)   Wt 102 kg   SpO2 100%   BMI 35.22 kg/m  Pain Scale: CPOT   Pain Score: 0-No pain   SpO2: SpO2: 100 % O2 Device:SpO2: 100 % O2 Flow Rate: .O2 Flow Rate (L/min): 6 L/min  IO: Intake/output summary:  Intake/Output Summary (Last 24 hours) at 09/30/2021 1003 Last data filed at 09/30/2021 1000 Gross per 24 hour  Intake 4260.54 ml  Output 2586 ml  Net 1674.54 ml    LBM: Last BM Date :  (PTA) Baseline Weight: Weight: 101 kg Most recent weight: Weight: 102 kg     Palliative Assessment/Data:    Discussed with Treatmetn Team via secure chat   Signed by: Mark Lessen, NP   Please contact Palliative Medicine Team phone at [phone] for questions and concerns.  For individual provider: See Shea Evans

## 2021-09-30 NOTE — Procedures (Addendum)
Cortrak  Person Inserting Tube:  [name] D, RD Tube Type:  Cortrak - 55 inches Tube Size:  10 Tube Location:  Left nare Secured by: Bridle Technique Used to Measure Tube Placement:  Marking at nare/corner of mouth Cortrak Secured At:  92 cm  Cortrak Tube Team Note:  Consult received to place a Cortrak feeding tube.   X-ray is required, abdominal x-ray has been ordered by the Cortrak team. Please confirm tube placement before using the Cortrak tube.   If the tube becomes dislodged please keep the tube and contact the Cortrak team at [email or web address] ([email or web address]) for replacement.  If after hours and replacement cannot be delayed, place a NG tube and confirm placement with an abdominal x-ray.    [name], RD, LDN Clinical Dietitian RD pager # available in [place]  After hours/weekend pager # available in [facility]

## 2021-09-30 NOTE — Progress Notes (Signed)
OT Cancellation Note  Patient Details Name: [name] MRN: [id number] DOB: 04-Mar-1960   Cancelled Treatment:    Reason Eval/Treat Not Completed: Patient not medically ready.  Jolaine Artist, OT Acute Rehabilitation Services Office [place] 09/30/2021, 8:02 AM

## 2021-09-30 NOTE — Plan of Care (Signed)
  Problem: Cardiac: Goal: Will achieve and/or maintain hemodynamic stability Outcome: Progressing   Problem: Urinary Elimination: Goal: Ability to achieve and maintain adequate renal perfusion and functioning will improve Outcome: Progressing   Problem: Clinical Measurements: Goal: Diagnostic test results will improve Outcome: Progressing Goal: Cardiovascular complication will be avoided Outcome: Progressing

## 2021-09-30 NOTE — Anesthesia Preprocedure Evaluation (Signed)
Anesthesia Evaluation  Patient identified by MRN, date of birth, ID band Patient unresponsive    Reviewed: Allergy & Precautions, NPO status , Patient's Chart, lab work & pertinent test results  Airway Mallampati: Intubated  TM Distance: >3 FB Neck ROM: Full    Dental   Pulmonary Patient abstained from smoking., former smoker,  Intubated   Pulmonary exam normal    + intubated    Cardiovascular hypertension, Pt. on medications and Pt. on home beta blockers + angina + CAD, + Past MI and + Cardiac Stents  Normal cardiovascular exam  VA ECMO   Neuro/Psych CVA, Residual Symptoms    GI/Hepatic   Endo/Other  diabetes, Insulin Dependent  Renal/GU ARFRenal disease     Musculoskeletal   Abdominal (+) + obese,   Peds  Hematology  (+) Blood dyscrasia, anemia ,   Anesthesia Other Findings chest washout and wound exploration   Reproductive/Obstetrics                             Anesthesia Physical  Anesthesia Plan  ASA: 4  Anesthesia Plan: General   Post-op Pain Management:    Induction:   PONV Risk Score and Plan: 2 and Treatment may vary due to age or medical condition  Airway Management Planned: Oral ETT  Additional Equipment:   Intra-op Plan:   Post-operative Plan: Post-operative intubation/ventilation  Informed Consent:     History available from chart only  Plan Discussed with: CRNA  Anesthesia Plan Comments:         Anesthesia Quick Evaluation

## 2021-09-30 NOTE — Progress Notes (Signed)
Overnight ECMO events:  1941 Contacted Dr. Dannielle Burn concerning chugging issues despite giving 1 Albumin and 1 PRBC  that was given at the end of prior shift.  Verbal order given for 1 FFP and 1 Cryo.  2022 Dr. Haroldine Laws made contact for update; advised that patient was continuously bleeding from return site with pooling of blood in the bed. Verbal order was given for 2 more FFP, Kcentra, and Ddavp.  0320 Hemoglobin dropped below 8 on ECMO pump with chugging issues.  Dr. Haroldine Laws contacted and order was given to given 2 units of PRBC.  0430 Unable to flow on ECMO pump and aline pressures dropped into the 50's.  Fluid resuscitation started and MD's contacted.  ECMO flows of 2.5 L were achieved with fluid resuscitation.  Decision was made to take patient back to the OR.    0550 Patient was transported to OR 17 with two ECMO specialists, RN, RT, CRNA, and surgeon.

## 2021-09-30 NOTE — Progress Notes (Signed)
   Called by bedside team. Patient with loss of pulse and unable to flow ECMO well.   Orders for more blood and albumin placed. Dr. Kipp Brood notified  On my arrival, patient had return of sinus rhythm and able to flow ~ 3.5L/min on ECMO pump.   Bed soaked in blood. Chest dressing soaked.   We continued to support with blood products and albumin  Chest prepped and draped in sterile fashion and room set up for Dr. Kipp Brood.   Patient maintained a pulse and pressure with epi at 5.  OR team and Dr. Kipp Brood arrived and decision to transfer patient to OR for chest washout and exploration.   ECMO parameters reviewed and were stable on leaving for OR.   Critical care time 50 mins.   Glori Bickers, MD  5:54 AM

## 2021-09-30 NOTE — TOC CM/SW Note (Signed)
..   Transition of Care [facility]) Screening Note   Patient Details  Name: [name] Date of Birth: 04/02/1960   Transition of Care [facility]) CM/SW Contact:    Erenest Rasher, RN Phone Number: [phone] 09/30/2021, 12:01 PM    Transition of Care Department [facility]) has reviewed patient. We will continue to monitor patient advancement through interdisciplinary progression rounds. TOC CM will continue to follow for dc needs once pt is stable. [place], Heart Failure TOC CM [phone]

## 2021-09-30 NOTE — Progress Notes (Addendum)
Late entry: mass transfusion protocol started upon arrival at 1600 09/29/2021, from 1900 to 0400 12PRBC given,3 FFP and 1 Cryo given. Pt continues to bleed actively around chest dressing MD aware, will continue to acess

## 2021-09-30 NOTE — Progress Notes (Signed)
Initial Nutrition Assessment  DOCUMENTATION CODES:   Not applicable  INTERVENTION:   If pt does not tolerate gastric feedings, radiology can attempt to advance tube or Cortrak team can try again on Wednesday  Tube Feeding via Cortrak: tip is currently gastric-ok to begin trickle TF per Dr. Lynetta Mare Pivot 1.5 at 20 ml/hr (no titration yet)-Reverse Trendelenburg of >10 degrees at all times while feeding   TF goal Regimen:  Pivot 1.5 at 60 ml/hr This provides 135 g of protein, 2160 kcals, 1094 mL of free water  NUTRITION DIAGNOSIS:   Inadequate oral intake related to acute illness as evidenced by NPO status.   GOAL:   Patient will meet greater than or equal to 90% of their needs  MONITOR:   Vent status, Labs, Weight trends, TF tolerance  REASON FOR ASSESSMENT:   Ventilator    ASSESSMENT:   61 yo male admitted with acute on chronic CHF with recent CABG, symptomatic bradycardia and hypotension. PMH includes DM, CAD s/p recent CABG x 5, CKD 3, CVA  8/19 Pericardial window, evacuation of pericardial and pleural effusion 8/20 Intubated, Bedside mediastinal re-exploration, cardiac tamponade, mediastinal clot; PEA arrest due to tamponade, VA ECMO cannulation 8/21 Return to OR for bleeding, mediastinal re-exploration  Pt remains sedated on vent support, chest remains open VA ECMO day 1  No OG/NG, Cortrak tube placed today, Team attempted postpyloric placement but only able to obtain gastric  Mediastinal drains x 3, L. Pleural x 1: total output 1761 mL  Unable to obtain diet and weight history from patient at this time  Labs: reviewed Meds: colace, insulin drip, miralax    Diet Order:   Diet Order             Diet NPO time specified  Diet effective now                   EDUCATION NEEDS:   Not appropriate for education at this time  Skin:  Skin Assessment: Skin Integrity Issues: Skin Integrity Issues:: Incisions, Other (Comment) Incisions: open chest, VA  ECMO Other: dehisced wound (previous surgical incision) to R medial pretibial  Last BM:  PTA  Height:   Ht Readings from Last 1 Encounters:  09/29/21 5\' 7"  (1.702 m)    Weight:   Wt Readings from Last 1 Encounters:  09/29/21 102 kg   BMI:  Body mass index is 35.22 kg/m.  Estimated Nutritional Needs:   Kcal:  2000-2200 kcals  Protein:  130-155 g  Fluid:  1.8 L   Kerman Passey MS, RDN, LDN, CNSC Registered Dietitian 3 Clinical Nutrition RD Pager and On-Call Pager Number Located in [place]

## 2021-10-01 ENCOUNTER — Inpatient Hospital Stay (HOSPITAL_COMMUNITY): Payer: Self-pay

## 2021-10-01 ENCOUNTER — Encounter (HOSPITAL_COMMUNITY): Payer: Self-pay | Admitting: Thoracic Surgery (Cardiothoracic Vascular Surgery)

## 2021-10-01 LAB — BPAM PLATELET PHERESIS
Blood Product Expiration Date: 202308232359
ISSUE DATE / TIME: 202308210448
Unit Type and Rh: 7300

## 2021-10-01 LAB — POCT I-STAT 7, (LYTES, BLD GAS, ICA,H+H)
Acid-Base Excess: 5 mmol/L — ABNORMAL HIGH (ref 0.0–2.0)
Acid-Base Excess: 4 mmol/L — ABNORMAL HIGH (ref 0.0–2.0)
Acid-Base Excess: 5 mmol/L — ABNORMAL HIGH (ref 0.0–2.0)
Acid-Base Excess: 4 mmol/L — ABNORMAL HIGH (ref 0.0–2.0)
Bicarbonate: 30.4 mmol/L — ABNORMAL HIGH (ref 20.0–28.0)
Bicarbonate: 30.2 mmol/L — ABNORMAL HIGH (ref 20.0–28.0)
Bicarbonate: 31.1 mmol/L — ABNORMAL HIGH (ref 20.0–28.0)
Bicarbonate: 29.7 mmol/L — ABNORMAL HIGH (ref 20.0–28.0)
Calcium, Ion: 1.15 mmol/L (ref 1.15–1.40)
Calcium, Ion: 1.15 mmol/L (ref 1.15–1.40)
Calcium, Ion: 1.12 mmol/L — ABNORMAL LOW (ref 1.15–1.40)
Calcium, Ion: 1.12 mmol/L — ABNORMAL LOW (ref 1.15–1.40)
HCT: 20 % — ABNORMAL LOW (ref 39.0–52.0)
HCT: 20 % — ABNORMAL LOW (ref 39.0–52.0)
HCT: 27 % — ABNORMAL LOW (ref 39.0–52.0)
HCT: 22 % — ABNORMAL LOW (ref 39.0–52.0)
Hemoglobin: 6.8 g/dL — CL (ref 13.0–17.0)
Hemoglobin: 6.8 g/dL — CL (ref 13.0–17.0)
Hemoglobin: 9.2 g/dL — ABNORMAL LOW (ref 13.0–17.0)
Hemoglobin: 7.5 g/dL — ABNORMAL LOW (ref 13.0–17.0)
O2 Saturation: 100 %
O2 Saturation: 100 %
O2 Saturation: 100 %
O2 Saturation: 100 %
Patient temperature: 36.5
Patient temperature: 36.5
Patient temperature: 36.4
Patient temperature: 36.5
Potassium: 4.2 mmol/L (ref 3.5–5.1)
Potassium: 4.3 mmol/L (ref 3.5–5.1)
Potassium: 4.3 mmol/L (ref 3.5–5.1)
Potassium: 4.2 mmol/L (ref 3.5–5.1)
Sodium: 139 mmol/L (ref 135–145)
Sodium: 140 mmol/L (ref 135–145)
Sodium: 140 mmol/L (ref 135–145)
Sodium: 141 mmol/L (ref 135–145)
TCO2: 32 mmol/L (ref 22–32)
TCO2: 32 mmol/L (ref 22–32)
TCO2: 33 mmol/L — ABNORMAL HIGH (ref 22–32)
TCO2: 31 mmol/L (ref 22–32)
pCO2 arterial: 52.2 mmHg — ABNORMAL HIGH (ref 32–48)
pCO2 arterial: 52.1 mmHg — ABNORMAL HIGH (ref 32–48)
pCO2 arterial: 54.5 mmHg — ABNORMAL HIGH (ref 32–48)
pCO2 arterial: 51.1 mmHg — ABNORMAL HIGH (ref 32–48)
pH, Arterial: 7.372 (ref 7.35–7.45)
pH, Arterial: 7.369 (ref 7.35–7.45)
pH, Arterial: 7.362 (ref 7.35–7.45)
pH, Arterial: 7.369 (ref 7.35–7.45)
pO2, Arterial: 505 mmHg — ABNORMAL HIGH (ref 83–108)
pO2, Arterial: 492 mmHg — ABNORMAL HIGH (ref 83–108)
pO2, Arterial: 448 mmHg — ABNORMAL HIGH (ref 83–108)
pO2, Arterial: 456 mmHg — ABNORMAL HIGH (ref 83–108)

## 2021-10-01 LAB — BASIC METABOLIC PANEL
Anion gap: 3 — ABNORMAL LOW (ref 5–15)
Anion gap: 5 (ref 5–15)
BUN: 39 mg/dL — ABNORMAL HIGH (ref 8–23)
BUN: 43 mg/dL — ABNORMAL HIGH (ref 8–23)
CO2: 26 mmol/L (ref 22–32)
CO2: 28 mmol/L (ref 22–32)
Calcium: 7.2 mg/dL — ABNORMAL LOW (ref 8.9–10.3)
Calcium: 7.4 mg/dL — ABNORMAL LOW (ref 8.9–10.3)
Chloride: 109 mmol/L (ref 98–111)
Chloride: 108 mmol/L (ref 98–111)
Creatinine, Ser: 2.47 mg/dL — ABNORMAL HIGH (ref 0.61–1.24)
Creatinine, Ser: 2.62 mg/dL — ABNORMAL HIGH (ref 0.61–1.24)
GFR, Estimated: 29 mL/min — ABNORMAL LOW (ref >60)
GFR, Estimated: 27 mL/min — ABNORMAL LOW (ref >60)
Glucose, Bld: 196 mg/dL — ABNORMAL HIGH (ref 70–99)
Glucose, Bld: 183 mg/dL — ABNORMAL HIGH (ref 70–99)
Potassium: 4.1 mmol/L (ref 3.5–5.1)
Potassium: 4.3 mmol/L (ref 3.5–5.1)
Sodium: 138 mmol/L (ref 135–145)
Sodium: 141 mmol/L (ref 135–145)

## 2021-10-01 LAB — CBC
HCT: 23.2 % — ABNORMAL LOW (ref 39.0–52.0)
HCT: 21.1 % — ABNORMAL LOW (ref 39.0–52.0)
Hemoglobin: 8.1 g/dL — ABNORMAL LOW (ref 13.0–17.0)
Hemoglobin: 7.3 g/dL — ABNORMAL LOW (ref 13.0–17.0)
MCH: 30 pg (ref 26.0–34.0)
MCH: 29.9 pg (ref 26.0–34.0)
MCHC: 34.9 g/dL (ref 30.0–36.0)
MCHC: 34.6 g/dL (ref 30.0–36.0)
MCV: 85.9 fL (ref 80.0–100.0)
MCV: 86.5 fL (ref 80.0–100.0)
Platelets: 99 10*3/uL — ABNORMAL LOW (ref 150–400)
Platelets: 101 10*3/uL — ABNORMAL LOW (ref 150–400)
RBC: 2.7 MIL/uL — ABNORMAL LOW (ref 4.22–5.81)
RBC: 2.44 MIL/uL — ABNORMAL LOW (ref 4.22–5.81)
RDW: 15.3 % (ref 11.5–15.5)
RDW: 15.6 % — ABNORMAL HIGH (ref 11.5–15.5)
WBC: 16 10*3/uL — ABNORMAL HIGH (ref 4.0–10.5)
WBC: 15 10*3/uL — ABNORMAL HIGH (ref 4.0–10.5)
nRBC: 0.8 % — ABNORMAL HIGH (ref 0.0–0.2)
nRBC: 0.5 % — ABNORMAL HIGH (ref 0.0–0.2)

## 2021-10-01 LAB — BPAM CRYOPRECIPITATE
Blood Product Expiration Date: 202308211106
ISSUE DATE / TIME: 202308210526
Unit Type and Rh: 5100

## 2021-10-01 LAB — PREPARE PLATELET PHERESIS: Unit division: 0

## 2021-10-01 LAB — PREPARE RBC (CROSSMATCH)

## 2021-10-01 LAB — PREPARE CRYOPRECIPITATE: Unit division: 0

## 2021-10-01 LAB — GLUCOSE, CAPILLARY
Glucose-Capillary: 166 mg/dL — ABNORMAL HIGH (ref 70–99)
Glucose-Capillary: 198 mg/dL — ABNORMAL HIGH (ref 70–99)
Glucose-Capillary: 172 mg/dL — ABNORMAL HIGH (ref 70–99)
Glucose-Capillary: 163 mg/dL — ABNORMAL HIGH (ref 70–99)
Glucose-Capillary: 136 mg/dL — ABNORMAL HIGH (ref 70–99)

## 2021-10-01 LAB — HEPATIC FUNCTION PANEL
ALT: 19 U/L (ref 0–44)
AST: 45 U/L — ABNORMAL HIGH (ref 15–41)
Albumin: 2.3 g/dL — ABNORMAL LOW (ref 3.5–5.0)
Alkaline Phosphatase: 43 U/L (ref 38–126)
Bilirubin, Direct: 1.3 mg/dL — ABNORMAL HIGH (ref 0.0–0.2)
Indirect Bilirubin: 1 mg/dL — ABNORMAL HIGH (ref 0.3–0.9)
Total Bilirubin: 2.3 mg/dL — ABNORMAL HIGH (ref 0.3–1.2)
Total Protein: 3.6 g/dL — ABNORMAL LOW (ref 6.5–8.1)

## 2021-10-01 LAB — APTT
aPTT: 38 seconds — ABNORMAL HIGH (ref 24–36)
aPTT: 43 seconds — ABNORMAL HIGH (ref 24–36)

## 2021-10-01 LAB — HEMOGLOBIN AND HEMATOCRIT, BLOOD
HCT: 22 % — ABNORMAL LOW (ref 39.0–52.0)
Hemoglobin: 7.6 g/dL — ABNORMAL LOW (ref 13.0–17.0)

## 2021-10-01 LAB — FIBRINOGEN: Fibrinogen: 340 mg/dL (ref 210–475)

## 2021-10-01 LAB — PROTIME-INR
INR: 2 — ABNORMAL HIGH (ref 0.8–1.2)
Prothrombin Time: 22 seconds — ABNORMAL HIGH (ref 11.4–15.2)

## 2021-10-01 LAB — HEPARIN LEVEL (UNFRACTIONATED)
Heparin Unfractionated: >1.1 IU/mL — ABNORMAL HIGH (ref 0.30–0.70)
Heparin Unfractionated: >1.1 IU/mL — ABNORMAL HIGH (ref 0.30–0.70)

## 2021-10-01 LAB — SURGICAL PATHOLOGY

## 2021-10-01 LAB — LACTIC ACID, PLASMA: Lactic Acid, Venous: 1.2 mmol/L (ref 0.5–1.9)

## 2021-10-01 LAB — LACTATE DEHYDROGENASE: LDH: 173 U/L (ref 98–192)

## 2021-10-01 LAB — MAGNESIUM
Magnesium: 2.6 mg/dL — ABNORMAL HIGH (ref 1.7–2.4)
Magnesium: 2.8 mg/dL — ABNORMAL HIGH (ref 1.7–2.4)

## 2021-10-01 LAB — PHOSPHORUS
Phosphorus: 4.5 mg/dL (ref 2.5–4.6)
Phosphorus: 4.3 mg/dL (ref 2.5–4.6)

## 2021-10-01 MED ORDER — AMIODARONE HCL IN DEXTROSE 360-4.14 MG/200ML-% IV SOLN
30.0000 mg/h | INTRAVENOUS | Status: DC
  Administered 2021-10-01 – 2021-10-07 (×14): 30 mg/h via INTRAVENOUS
  Filled 2021-10-01 (×12): qty 200

## 2021-10-01 MED ORDER — INSULIN ASPART 100 UNIT/ML IJ SOLN
2.0000 [IU] | INTRAMUSCULAR | Status: DC
  Administered 2021-10-01 (×4): 4 [IU] via SUBCUTANEOUS
  Administered 2021-10-01: 2 [IU] via SUBCUTANEOUS
  Administered 2021-10-02 (×3): 4 [IU] via SUBCUTANEOUS
  Administered 2021-10-02: 2 [IU] via SUBCUTANEOUS
  Administered 2021-10-02: 4 [IU] via SUBCUTANEOUS
  Administered 2021-10-02 – 2021-10-03 (×4): 2 [IU] via SUBCUTANEOUS
  Administered 2021-10-03: 4 [IU] via SUBCUTANEOUS
  Administered 2021-10-03 – 2021-10-04 (×4): 2 [IU] via SUBCUTANEOUS
  Administered 2021-10-04: 4 [IU] via SUBCUTANEOUS
  Administered 2021-10-04: 2 [IU] via SUBCUTANEOUS
  Administered 2021-10-04 – 2021-10-05 (×3): 4 [IU] via SUBCUTANEOUS
  Administered 2021-10-05: 2 [IU] via SUBCUTANEOUS
  Administered 2021-10-05 (×2): 4 [IU] via SUBCUTANEOUS
  Administered 2021-10-06 – 2021-10-07 (×4): 2 [IU] via SUBCUTANEOUS
  Administered 2021-10-07: 6 [IU] via SUBCUTANEOUS

## 2021-10-01 MED ORDER — ENOXAPARIN SODIUM 30 MG/0.3ML IJ SOSY
30.0000 mg | PREFILLED_SYRINGE | INTRAMUSCULAR | Status: DC
  Administered 2021-10-01 – 2021-10-02 (×2): 30 mg via SUBCUTANEOUS
  Filled 2021-10-01 (×2): qty 0.3

## 2021-10-01 MED ORDER — AMIODARONE HCL IN DEXTROSE 360-4.14 MG/200ML-% IV SOLN
60.0000 mg/h | INTRAVENOUS | Status: AC
  Administered 2021-10-01: 60 mg/h via INTRAVENOUS

## 2021-10-01 MED ORDER — COLCHICINE 0.6 MG PO TABS
0.6000 mg | ORAL_TABLET | Freq: Every day | ORAL | Status: DC
  Administered 2021-10-01 – 2021-10-15 (×13): 0.6 mg
  Filled 2021-10-01 (×13): qty 1

## 2021-10-01 MED ORDER — SODIUM CHLORIDE 0.9% FLUSH
10.0000 mL | Freq: Two times a day (BID) | INTRAVENOUS | Status: DC
  Administered 2021-10-01 – 2021-10-06 (×10): 10 mL

## 2021-10-01 MED ORDER — INSULIN DETEMIR 100 UNIT/ML ~~LOC~~ SOLN
5.0000 [IU] | Freq: Two times a day (BID) | SUBCUTANEOUS | Status: DC
  Administered 2021-10-01 – 2021-10-02 (×3): 5 [IU] via SUBCUTANEOUS
  Filled 2021-10-01 (×4): qty 0.05

## 2021-10-01 MED ORDER — SODIUM CHLORIDE 0.9% IV SOLUTION: Freq: Once | INTRAVENOUS | Status: AC

## 2021-10-01 MED ORDER — ARTIFICIAL TEARS OPHTHALMIC OINT
TOPICAL_OINTMENT | OPHTHALMIC | Status: DC | PRN
  Administered 2021-10-12: 1 via OPHTHALMIC
  Filled 2021-10-01: qty 3.5

## 2021-10-01 MED ORDER — AMIODARONE LOAD VIA INFUSION
150.0000 mg | Freq: Once | INTRAVENOUS | Status: AC
  Administered 2021-10-01: 150 mg via INTRAVENOUS
  Filled 2021-10-01: qty 83.34

## 2021-10-01 MED ORDER — PROSOURCE TF20 ENFIT COMPATIBL EN LIQD
60.0000 mL | Freq: Four times a day (QID) | ENTERAL | Status: DC
  Administered 2021-10-01 – 2021-10-02 (×6): 60 mL
  Filled 2021-10-01 (×6): qty 60

## 2021-10-01 NOTE — Addendum Note (Signed)
Addendum  created 10/01/21 0926 by Josephine Igo, CRNA   Order list changed, Pharmacy for encounter modified

## 2021-10-01 NOTE — Addendum Note (Signed)
Addendum  created 10/01/21 0957 by Josephine Igo, CRNA   Order list changed, Pharmacy for encounter modified

## 2021-10-01 NOTE — Progress Notes (Signed)
  Amiodarone Drug - Drug Interaction Consult Note  Recommendations: No changes at this time, will continue to monitor.  Amiodarone is metabolized by the cytochrome P450 system and therefore has the potential to cause many drug interactions. Amiodarone has an average plasma half-life of 50 days (range 20 to 100 days).   There is potential for drug interactions to occur several weeks or months after stopping treatment and the onset of drug interactions may be slow after initiating amiodarone.   [x]  Statins: Increased risk of myopathy. Simvastatin- restrict dose to 20mg  daily. Other statins: counsel patients to report any muscle pain or weakness immediately.  []  Anticoagulants: Amiodarone can increase anticoagulant effect. Consider warfarin dose reduction. Patients should be monitored closely and the dose of anticoagulant altered accordingly, remembering that amiodarone levels take several weeks to stabilize.  []  Antiepileptics: Amiodarone can increase plasma concentration of phenytoin, the dose should be reduced. Note that small changes in phenytoin dose can result in large changes in levels. Monitor patient and counsel on signs of toxicity.  []  Beta blockers: increased risk of bradycardia, AV block and myocardial depression. Sotalol - avoid concomitant use.  []   Calcium channel blockers (diltiazem and verapamil): increased risk of bradycardia, AV block and myocardial depression.  []   Cyclosporine: Amiodarone increases levels of cyclosporine. Reduced dose of cyclosporine is recommended.  []  Digoxin dose should be halved when amiodarone is started.  []  Diuretics: increased risk of cardiotoxicity if hypokalemia occurs.  []  Oral hypoglycemic agents (glyburide, glipizide, glimepiride): increased risk of hypoglycemia. Patient's glucose levels should be monitored closely when initiating amiodarone therapy.   []  Drugs that prolong the QT interval:  Torsades de pointes risk may be increased with  concurrent use - avoid if possible.  Monitor QTc, also keep magnesium/potassium WNL if concurrent therapy can't be avoided.  Antibiotics: e.g. fluoroquinolones, erythromycin.  Antiarrhythmics: e.g. quinidine, procainamide, disopyramide, sotalol.  Antipsychotics: e.g. phenothiazines, haloperidol.   Lithium, tricyclic antidepressants, and methadone. Thank You,  Ursula Beath  10/01/2021 9:47 AM

## 2021-10-01 NOTE — Progress Notes (Signed)
Advanced Heart Failure Rounding Note   Subjective:    - 8/19 Pericardial window - 8/20 Cardiac arrest with tamponade -> Emergent bedside washout - 09/29/21 VA Cannulation - 09/30/21 Return to OR for mediastinal hemorrhage - 10/01/21 Developed AF -> amio  Remains intubated/sedated. On VA ECMO with open chest. Off heparin. Circuit flowing well.   On NE 20   Developed AF this am and now back in sinus brady.   Bleeding has essentially resolved.  MAPs 70s   SCr 2.4   Bedside echo EF 20-25% moderate RV dysfunction. Residual clot/kerlex in pericardium   Lactic acid 1.2    Objective:   Weight Range:  Vital Signs:   Temp:  [96.3 F (35.7 C)-97.9 F (36.6 C)] 97.7 F (36.5 C) (08/22 0700) Pulse Rate:  [58-85] 58 (08/22 0755) Resp:  [0-24] 10 (08/22 0700) BP: (70-98)/(64-77) 98/77 (08/22 0353) SpO2:  [96 %-100 %] 100 % (08/22 0700) Arterial Line BP: (63-93)/(58-77) 91/70 (08/22 0700) FiO2 (%):  [21 %] 21 % (08/22 0755) Weight:  [105.2 kg] 105.2 kg (08/22 0500) Last BM Date :  (PTA)  Weight change: Filed Weights   09/28/21 0430 09/29/21 0500 10/01/21 0500  Weight: 101 kg 102 kg 105.2 kg    Intake/Output:   Intake/Output Summary (Last 24 hours) at 10/01/2021 0815 Last data filed at 10/01/2021 0700 Gross per 24 hour  Intake 3815.37 ml  Output 3381 ml  Net 434.37 ml     Physical Exam: General:  Intubated/sedated HEENT: normal + ETT Neck: supple. + RIJ/LIJ central lines Cor: Chest open with CTs and aortic return cannula Lungs: clear Abdomen: soft, nontender, nondistended. No hepatosplenomegaly. No bruits or masses. Good bowel sounds. Extremities: no cyanosis, clubbing, rash, edema + RFV venous drainage cannula Neuro: sedated  Telemetry: Sinus brady 50s + PACs. Personally reviewed  Labs: Basic Metabolic Panel: Recent Labs  Lab 09/29/21 0359 09/29/21 0430 09/29/21 1154 09/29/21 1244 09/29/21 1956 09/29/21 2152 09/30/21 0715 09/30/21 0829 09/30/21 0835  09/30/21 0933 09/30/21 1140 09/30/21 1147 09/30/21 1650 09/30/21 1936 09/30/21 2202 10/01/21 0430 10/01/21 0431  NA 132*   < > 136   < > 139   < > 140   < > 137   < > 138   < > 139 139 138 138 139  K 4.5   < > 4.0   < > 4.3   < > 3.6   < > 3.6   < > 3.8   < > 4.0 3.9 4.0 4.1 4.2  CL 101  --  100   < > 102  --  99  --  104  --  105  --  106  --   --  109  --   CO2 21*  --  21*   < > 26  --   --   --  23  --  26  --  28  --   --  26  --   GLUCOSE 64*  --  121*   < > 238*  --  163*  --  139*  --  136*  --  126*  --   --  196*  --   BUN 52*  --  54*   < > 50*  --  37*  --  41*  --  38*  --  38*  --   --  39*  --   CREATININE 2.72*  --  3.28*   < > 2.92*  --  2.60*  --  2.48*  --  2.48*  --  2.44*  --   --  2.47*  --   CALCIUM 8.2*  --  8.6*   < > 7.7*  --   --   --  8.3*  --  7.6*  --  7.6*  --   --  7.2*  --   MG 2.4  --  2.2  --   --   --   --   --   --   --   --   --  1.7  --   --  2.6*  --   PHOS  --   --   --   --   --   --   --   --   --   --   --   --  4.6  --   --  4.5  --    < > = values in this interval not displayed.    Liver Function Tests: Recent Labs  Lab 09/28/21 0435 09/29/21 1719 09/29/21 1956 09/30/21 0835 10/01/21 0430  AST 39 247* 197* 94* 45*  ALT 51* 126* 82* 23 19  ALKPHOS 117 65 53 31* 43  BILITOT 1.3* 1.2 1.7* 2.2* 2.3*  PROT 6.2* 3.9* 3.7* 3.9* 3.6*  ALBUMIN 2.9* 2.2* 2.0* 2.9* 2.3*   No results for input(s): "LIPASE", "AMYLASE" in the last 168 hours. No results for input(s): "AMMONIA" in the last 168 hours.  CBC: Recent Labs  Lab 09/29/21 1956 09/29/21 2152 09/29/21 2319 09/30/21 0009 09/30/21 0835 09/30/21 0933 09/30/21 1140 09/30/21 1147 09/30/21 1650 09/30/21 1936 09/30/21 1940 09/30/21 2202 10/01/21 0430 10/01/21 0431  WBC 17.7*  --  13.7*  --  11.6*  --  12.0*  --  16.0*  --   --   --  16.0*  --   NEUTROABS 13.0*  --   --   --   --   --   --   --   --   --   --   --   --   --   HGB 8.2*   < > 8.4*   < > 10.1*   < > 7.3*   < > 8.1*  6.8* 8.0* 6.1* 8.1* 6.8*  HCT 24.4*   < > 25.2*   < > 28.8*   < > 21.4*   < > 23.2* 20.0* 22.4* 18.0* 23.2* 20.0*  MCV 82.2  --  84.0  --  81.6  --  83.9  --  84.4  --   --   --  85.9  --   PLT 258  --  158  --  82*  --  101*  --  100*  --   --   --  99*  --    < > = values in this interval not displayed.    Cardiac Enzymes: No results for input(s): "CKTOTAL", "CKMB", "CKMBINDEX", "TROPONINI" in the last 168 hours.  BNP: BNP (last 3 results) Recent Labs    12/24/20 0651 09/28/21 0405  BNP 412.0* 826.6*    ProBNP (last 3 results) No results for input(s): "PROBNP" in the last 8760 hours.    Other results:  Imaging: DG CHEST PORT 1 VIEW  Result Date: 09/30/2021 CLINICAL DATA:  Postoperative state EXAM: PORTABLE CHEST 1 VIEW COMPARISON:  Previous studies including the examination done earlier today FINDINGS: Transverse diameter of heart is increased. Increased density in left lower lung field may suggest pleural  effusion and possibly underlying infiltrates. There is mediastinal drain. Left chest tube is noted. Tip of endotracheal tube is 3.6 cm above the carina. Distal portion of enteric tube is seen in the stomach. There is a large caliber catheter in the course of left subclavian. There is another large caliber catheter in the course of the inferior vena cava, right atrium and superior vena cava. Tip of right IJ central venous catheter is seen in the region of superior vena cava. IMPRESSION: Increased density in left lower lung fields suggests pleural effusion with possible interval increase. There are no signs of alveolar pulmonary edema. Other findings as described in the body of the report. Electronically Signed   By: Elmer Picker M.D.   On: 09/30/2021 17:40   DG Abd 1 View  Result Date: 09/30/2021 CLINICAL DATA:  Feeding tube placement EXAM: ABDOMEN - 1 VIEW COMPARISON:  Previous study including the examination done earlier today FINDINGS: Tip of feeding tube is noted in the  region of proximal antrum of the stomach with the tip pointing to the right. In the previous study, tip of feeding tube pointing to the left. There is increased density in left lower lung field. There is dilation of small-bowel loops. This finding is not fully evaluated. IMPRESSION: Tip of feeding tube is seen in the region of antrum of the stomach. Electronically Signed   By: Elmer Picker M.D.   On: 09/30/2021 17:35   DG Abd Portable 1V  Result Date: 09/30/2021 CLINICAL DATA:  Feeding tube placement. EXAM: PORTABLE ABDOMEN - 1 VIEW COMPARISON:  Chest radiographs 09/30/2021 and 09/29/2021. FINDINGS: 1249 hours. A feeding tube is looped over the stomach with the tip projected laterally in the mid stomach. ECMO catheter projects over the IVC and right atrium, tip not visualized. The bowel gas pattern is nonobstructive. IMPRESSION: Tip of the feeding tube projects over the mid stomach. Electronically Signed   By: Richardean Sale M.D.   On: 09/30/2021 13:28   DG CHEST PORT 1 VIEW  Result Date: 09/30/2021 CLINICAL DATA:  Intubation EXAM: PORTABLE CHEST 1 VIEW COMPARISON:  Portable exam 0908 hours compared to 09/29/2021 FINDINGS: Tip of endotracheal tube projects 2.1 cm above carina. RIGHT jugular catheter tip projects over SVC. Mediastinal drains and LEFT thoracostomy tube present. ECMO catheters present. Enlargement of cardiac silhouette post CABG. LEFT basilar atelectasis. Atherosclerotic calcification aorta. No definite infiltrate or pneumothorax. IMPRESSION: LEFT lower lobe atelectasis. Electronically Signed   By: Lavonia Dana M.D.   On: 09/30/2021 10:43   DG CHEST PORT 1 VIEW  Result Date: 09/29/2021 CLINICAL DATA:  Central line placement EXAM: PORTABLE CHEST 1 VIEW COMPARISON:  Previous studies including the examinations done earlier today FINDINGS: Transverse diameter of heart is increased. Left chest tube is noted with its tip in the lateral aspect of left lower lung field. Possible coronary  artery calcifications and stents are noted. Tip of endotracheal tube is 4.8 cm above the carina. There is a new large caliber tubular structure in the course of inferior vena cava, right atrium and superior vena cava. Tip of right IJ central venous catheter is seen in superior vena cava. Tip of left IJ central venous catheter is noted in the lateral aspect of upper portion of superior vena cava. There are metallic clamps obscuring the left apex. As far as seen, there is no evidence of any large pneumothorax. There is haziness in the left lower lung field, possibly suggesting small pleural effusion. IMPRESSION: Cardiomegaly. Small left pleural effusion. Other  findings as described in the body of the report. Electronically Signed   By: Elmer Picker M.D.   On: 09/29/2021 19:45   ECHO INTRAOPERATIVE TEE  Result Date: 09/29/2021  *INTRAOPERATIVE TRANSESOPHAGEAL REPORT *  Patient Name:   [name] [facility] Date of Exam: 09/29/2021 Medical Rec #:  [id number]    Height:       67.0 in Accession #:    [id number]   Weight:       224.9 lb Date of Birth:  [birth year]    BSA:          2.13 m Patient Age:    61 years     BP:           108/73 mmHg Patient Gender: M            HR:           81 bpm. Exam Location:  Inpatient Transesophogeal exam was perform intraoperatively during surgical procedure. Patient was closely monitored under general anesthesia during the entirety of examination. Indications:     ECMO Performing Phys: Adele Barthel MD Diagnosing Phys: Adele Barthel MD Complications: No known complications during this procedure. POST-OP IMPRESSIONS _ Left Ventricle: The left ventricle is unchanged from pre-bypass. _ Right Ventricle: The right ventricle appears unchanged from pre-bypass. _ Aorta: The aorta appears unchanged from pre-bypass. _ Left Atrial Appendage: The left atrial appendage appears unchanged from pre-bypass. _ Aortic Valve: The aortic valve appears unchanged from pre-bypass. _ Mitral Valve: The mitral valve  appears unchanged from pre-bypass. _ Tricuspid Valve: The tricuspid valve appears unchanged from pre-bypass. _ Pulmonic Valve: The pulmonic valve appears unchanged from pre-bypass. _ Interatrial Septum: The interatrial septum appears unchanged from pre-bypass. _ Pericardium: The pericardium appears unchanged from pre-bypass. _ Comments: ECMO cannula visualized in the SVC. PRE-OP FINDINGS  Left Ventricle: The left ventricle has mildly reduced systolic function, with an ejection fraction of 45-50%. The cavity size was normal. There is mildly increased left ventricular wall thickness. There is mild concentric left ventricular hypertrophy. Right Ventricle: The right ventricle has normal systolic function. The cavity was normal. There is no increase in right ventricular wall thickness. Left Atrium: Left atrial size was normal in size. No left atrial/left atrial appendage thrombus was detected. Right Atrium: Right atrial size was normal in size. Interatrial Septum: No atrial level shunt detected by color flow Doppler. Pericardium: There is no evidence of pericardial effusion. Mitral Valve: The mitral valve is normal in structure. Mitral valve regurgitation is trivial by color flow Doppler. Tricuspid Valve: The tricuspid valve was normal in structure. Tricuspid valve regurgitation is trivial by color flow Doppler. Aortic Valve: The aortic valve is tricuspid. Aortic valve regurgitation is mild by color flow Doppler. There is mild thickening and mild calcification present on the aortic valve right coronary, left coronary and non-coronary cusps. Pulmonic Valve: The pulmonic valve was normal in structure. Pulmonic valve regurgitation is not visualized by color flow Doppler. Aorta: The aortic root, ascending aorta and aortic arch are normal in size and structure. There is evidence of plaque in the descending aorta. +--------------+-------++ LEFT VENTRICLE        +--------------+-------++ PLAX 2D                +--------------+-------++ LVIDd:        5.40 cm +--------------+-------++ LVIDs:        4.10 cm +--------------+-------++ LV SV:        67 ml   +--------------+-------++ LV SV Index:  30.00   +--------------+-------++                       +--------------+-------++  +-------------+-------++  AORTA                +-------------+-------++ Ao Root diam:3.00 cm +-------------+-------++ Ao Asc diam: 3.10 cm +-------------+-------++  Adele Barthel MD Electronically signed by Adele Barthel MD Signature Date/Time: 09/29/2021/5:38:26 PM    Final    DG CHEST PORT 1 VIEW  Result Date: 09/29/2021 CLINICAL DATA:  Endotracheal tube placement EXAM: PORTABLE CHEST 1 VIEW COMPARISON:  09/29/2021 FINDINGS: Endotracheal tube with the tip 3.3 cm above the carina. Right jugular central venous catheter with the tip projecting over the SVC. Mild bilateral interstitial thickening. Left basilar chest tube in satisfactory position. Trace left pleural effusion. No pneumothorax. Stable cardiomegaly. Prior CABG. No acute osseous abnormality. IMPRESSION: 1. Endotracheal tube with the tip 3.3 cm above the carina. 2. Right jugular central venous catheter with the tip projecting over the SVC. Left basilar chest tube in satisfactory position. Electronically Signed   By: Kathreen Devoid M.D.   On: 09/29/2021 13:11     Medications:     Scheduled Medications:  aspirin EC  325 mg Oral BID   Or   aspirin  324 mg Per Tube BID   atorvastatin  80 mg Per Tube Daily   bisacodyl  10 mg Oral Daily   Or   bisacodyl  10 mg Rectal Daily   Chlorhexidine Gluconate Cloth  6 each Topical Daily   colchicine  0.6 mg Per Tube Daily   docusate  100 mg Per Tube BID   enoxaparin (LOVENOX) injection  30 mg Subcutaneous Q24H   feeding supplement (PROSource TF20)  60 mL Per Tube Daily   fentaNYL (SUBLIMAZE) injection  50 mcg Intravenous Once   insulin aspart  2-6 Units Subcutaneous Q4H   insulin detemir  5 Units Subcutaneous  Q12H   mouth rinse  15 mL Mouth Rinse Q2H   pantoprazole (PROTONIX) IV  40 mg Intravenous QHS   polyethylene glycol  17 g Per Tube Daily   sodium chloride flush  10-40 mL Intracatheter Q12H   sodium chloride flush  3 mL Intravenous Q12H    Infusions:  sodium chloride     albumin human     amiodarone Stopped (09/29/21 1417)   amiodarone 60 mg/hr (10/01/21 0654)   Followed by   amiodarone     feeding supplement (PIVOT 1.5 CAL) 20 mL/hr at 10/01/21 0700   fentaNYL infusion INTRAVENOUS 200 mcg/hr (10/01/21 0700)   meropenem (MERREM) IV Stopped (10/01/21 0558)   midazolam 6 mg/hr (10/01/21 0700)   norepinephrine (LEVOPHED) Adult infusion 20 mcg/min (10/01/21 0700)   vancomycin Stopped (09/30/21 1448)    PRN Medications: acetaminophen, albumin human, fentaNYL, ipratropium-albuterol, metoprolol tartrate, midazolam, midazolam, midazolam, nitroGLYCERIN, ondansetron (ZOFRAN) IV, mouth rinse, oxyCODONE, sodium chloride flush, traMADol   Assessment/Plan:    1. Shock - mixed cardiogenic/hemorrhagic - continue VA ECMO support + pressors as needed. No heparin - Transfuse keep hgb >= 8.0 - Bedside echo 8/22 EF 20-25% RV moderately down - Possible decannulation tomorrow in OR 2. Cardiac arrest (PEA/bradycardic) - 8/19 in setting of tamponade 3. Cardiac tamponade with emergent bedside sternotomy  4. Hemorrhagic shock - return to OR 8/21 - bleeding now stabilized - transfuse to keep Hgb >= 8.0 5. Acute hypoxemic respiratory failure - Now on VA ECMO - Vent support per CCM  6. 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 Add low-dose colchicine 8. 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 DM2 -  continue SSI 10. PAF - continue amio  CRITICAL CARE Performed by: Glori Bickers  Total critical care time: 45 minutes  Critical care time was exclusive of separately billable procedures and treating other  patients.  Critical care was necessary to treat or prevent imminent or life-threatening deterioration.  Critical care was time spent personally by me (independent of midlevel providers or residents) on the following activities: development of treatment plan with patient and/or surrogate as well as nursing, discussions with consultants, evaluation of patient's response to treatment, examination of patient, obtaining history from patient or surrogate, ordering and performing treatments and interventions, ordering and review of laboratory studies, ordering and review of radiographic studies, pulse oximetry and re-evaluation of patient's condition.  Length of Stay: 3   Glori Bickers 10/01/2021, 8:15 AM  Advanced Heart Failure Team Pager [phone] (M-F; 7a - 4p)  Please contact [facility] for night-coverage after hours (4p -7a ) and weekends on [email or web address]

## 2021-10-01 NOTE — CV Procedure (Addendum)
ECMO NOTE:   Indication: Cardiac arrest   Initial cannulation date: 09/29/21   ECMO type: VA ECMO   Dual lumen inflow/return cannula:   1) 23/25 FR multistage venous return cannula in RFV 2) Central aortic arterial return   ECMO events:   - 8/19 Pericardial window - 8/20 Cardiac arrest with tamponade -> Emergent bedside washout - 09/29/21 VA Cannulation - 09/30/21 Return to OR for mediastinal hemorrhage - 8/22 AF  Daily data:   Flow 3.95 L RPM 3150 Sweep  3 L   Labs:   ABG    Component Value Date/Time   PHART 7.372 10/01/2021 0431   PCO2ART 52.2 (H) 10/01/2021 0431   PO2ART 505 (H) 10/01/2021 0431   HCO3 30.4 (H) 10/01/2021 0431   TCO2 32 10/01/2021 0431   ACIDBASEDEF 5.0 (H) 09/29/2021 1443   O2SAT 100 10/01/2021 0431      Plan:  - Much improved after return to OR - Bleeding improved. Continue to hold heparin - Adjust pressors. Keep MAP 70-90 - Amio for AF - Echo today - Drain left pigtail - Possible decannulation tomorrow    Discussed in multidisciplinary fashion with CCM, TCTS, Cardiology, ECMO coordinator/specialist, RT, PharmD and nursing staff all present.     Glori Bickers, MD  7:51 AM

## 2021-10-01 NOTE — Consult Note (Signed)
NAME:  [name], MRN:  [id number], DOB:  01-08-1961, LOS: 3 ADMISSION DATE:  09/28/2021, CONSULTATION DATE:  09/29/21 REFERRING MD:  Dr. Kipp Brood, CHIEF COMPLAINT:  Cardiac Arrest   History of Present Illness:  [name] is a 61 year old male with DMII, , CVA, atrial fibrillation on eliquis and multivessel CAD s/p 5 vessel CABG on 09/12/21 who presented 8/19 with shortness of breath via EMS. He was found to be bradycardic and hypoglycemic. He symptomatically improved with dextrose but continued to have bradycardia and soft blood pressures. ECHO obtained which showed LVEF 50%, grade II diastolic dysfunction and moderate pericardial effusion with possible tamponade physiology. CT Chest showed pericardial effusion and small bilateral pleural effusions. He was taken to the OR 8/19 for pericardial window.   Patient started having issues with tachy-brady arrhythmias on telemetry, decreased urine output and increased work of breathing over night. A 14 french pigtail catheter was placed in the left pleural space this morning with bloody drainage. At 10:35am patient became unresponsive with PEA arrest. He underwent multiple rounds of CPR with epinephrine pushes. ROSC was obtained and patient was intubated. Bedside US by cardiology showed large pericardial effusion with tamponade physiology. He lost pulse again and CPR was again performed. Please see code sheet for full details. Bedside sternotomy performed with pericardial drainage by CT surgery, immediate improvement in hemodynamics achieved with drainage of the pericardial space.   Pertinent  Medical History   Past Medical History:  Diagnosis Date   AKI (acute kidney injury) ([place])    pt unaware of this   Anginal pain ([place])    CAD (coronary artery disease)    a. 03/2015 NSTEMI: LHC with severe 3V CAD  (70% mid RCA, 95% OM1, 90% distal LCx, 90% OM3, 80% prox LAD and 90% ost D1) s/p DES to mLAD w/ small dissction Rx with DES, staged ost Ramus PCI/DES and  dLCx s/p PCI/DES    Chest pain 12/24/2020   Diabetes mellitus type 2 in obese [facility])    Diverticulosis    Dyspnea    Dyspnea on exertion 03/16/2015   Dyspnea on exertion   Family history of adverse reaction to anesthesia    patient father- pt states after anesthesia his father "developed dementia"   GERD (gastroesophageal reflux disease)    Hypercholesteremia    Hypertension associated with diabetes ([place]) 03/16/2015   hypertension   NSTEMI (non-ST elevated myocardial infarction) ([place]) 03/17/2015   Obesity    Stroke ([place]) 2022   pt states he had a "mini stroke" during cardiac catheterization   Tobacco abuse    Significant Hospital Events: Including procedures, antibiotic start and stop dates in addition to other pertinent events   8/19 admitted, s/p pericardial window 8/20 PEA cardiac arrest, left pigtail chest tube placement, PEA cardiac arrest due to tamponade, bedside sternotomy with pericardial drains placed. 8/21 return to OR for overnight bleeding.  8/22 minimal chest tube output.  Atrial fibrillation, controled with amiodarone.   Interim History / Subjective:   No further bleeding.   Objective   Blood pressure 98/77, pulse (!) 58, temperature 97.7 F (36.5 C), resp. rate 10, height 5\' 7"  (1.702 m), weight 105.2 kg, SpO2 100 %. CVP:  [0 mmHg-14 mmHg] 14 mmHg  Vent Mode: PRVC FiO2 (%):  [21 %] 21 % Set Rate:  [10 bmp-16 bmp] 16 bmp Vt Set:  [390 mL] 390 mL PEEP:  [10 cmH20] 10 cmH20 Plateau Pressure:  [19 cmH20-21 cmH20] 21 cmH20   Intake/Output  Summary (Last 24 hours) at 10/01/2021 0829 Last data filed at 10/01/2021 0800 Gross per 24 hour  Intake 3835.37 ml  Output 3381 ml  Net 454.37 ml    Filed Weights   09/28/21 0430 09/29/21 0500 10/01/21 0500  Weight: 101 kg 102 kg 105.2 kg   Examination: General: ill appearing male, intubated, sedated HENT: [place]/AT, moist mucous membranes, sclera anicteric Lungs: course breath sounds, left chest tube in place, no air leak  noted. Pigtail flushes but not draining.  Cardiovascular: rrr, pericardial drains in place, open sternotomy wound with surgical dressing in place Abdomen: soft, bowel sounds dminished Extremities: cool to touch, no edema Neuro: sedated GU: foley in place  POC echo: very limited acoustic windows, LV/RV both reduced but limited filling. + pericardial clot vs packing.   Ancillary tests personally review:  pCO2 52 with pH 7.37 Creatinine 2.47 INR 2.0 CXR shows clear lung fields  Assessment & Plan:  PEA Cardiac Arrest Cardiac Tamponade s/p pericardial drain placement Hemorraghic Pericarditis Obstructive Shock Coronary Artery Disease s/p CABG x 5 8/3 Atrial Fibrillation - CT surgery and Cardiology following - Obstructive shock relieved with pericardial drain placement - Continue levophed  for MAP >65 - Amiodarone for atrial fibrillation - can drop rate to allow faster intrinsic  - Aspirin 325mg  BID and colchicine for pericarditis - Continue statin - Keep MAP 65-75, no more than 15 points of pulsatility to help prevent bleeding.   Acute Hypoxemic and Hypercapnic Respiratory Failure - continue mechanical ventilation, lung protective strategy - Transition to more normal ventilator settings. Reduced inspiratory time and increase respiratory rate to achieve normal minute ventilation.  - Wean FiO2 for SpO2 92% or greater - PFTs in 2022 were suggestive of restrictive defect - PRN duonebs ordered  Acute Kidney 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 Hyponatremia, mild - Monitor Cr and UOP - trend electrolytes  Sepsis due to Pericarditis - treatment as above - Will broadly cover with vancomycin + cefepime due to bedside sternotomy procedure  Acute Blood Loss Anemia - Maintain hemoglobin 8-10g/dL  Hx of DMII - monitor blood sugars - SSI if needed  Best Practice (right click and "Reselect all SmartList Selections" daily)   Diet/type: NPO - Cortrak today and initiate post-pyloric feeding.  DVT  prophylaxis: not indicated GI prophylaxis: PPI Lines: Central line and yes and it is still needed Foley:  Yes, and it is still needed Code Status:  full code Last date of multidisciplinary goals of care discussion [patient's friends updated 8/20]  CRITICAL CARE Performed by: Kipp Brood   Total critical care time: 45 minutes  Critical care time was exclusive of separately billable procedures and treating other patients.  Critical care was necessary to treat or prevent imminent or life-threatening deterioration.  Critical care was time spent personally by me on the following activities: development of treatment plan with patient and/or surrogate as well as nursing, discussions with consultants, evaluation of patient's response to treatment, examination of patient, obtaining history from patient or surrogate, ordering and performing treatments and interventions, ordering and review of laboratory studies, ordering and review of radiographic studies, pulse oximetry, re-evaluation of patient's condition and participation in multidisciplinary rounds.  Kipp Brood, MD [facility] ICU Physician [place]  Pager: [phone] Mobile: [phone] After hours: [phone].

## 2021-10-01 NOTE — Progress Notes (Signed)
OT Cancellation Note  Patient Details Name: TAKUYA LARICCIA MRN: [id number] DOB: [birth year]   Cancelled Treatment:    Reason Eval/Treat Not Completed: Patient not medically ready. OT will sign off, please re-consult when appropriate.   Jolaine Artist, OT Acute Rehabilitation Services Office [place] 10/01/2021, 9:49 AM

## 2021-10-01 NOTE — Progress Notes (Signed)
Overnight ECMO events:  0125: 1 PRBC given per instructions by Dr Darcey Nora (transfuse 1 PRBC if Levophed infusion consistently >15 mcg).  0645: Pt in A-fib, with intermittent loss of pulsatility. Called and spoke to Dr Haroldine Laws. Verbal order for Amio drip with 150mg  bolus.

## 2021-10-01 NOTE — Progress Notes (Signed)
1 Day Post-Op Procedure(s) (LRB): EXPLORATION POST OPERATIVE OPEN HEART WASHOUT (N/A) Subjective:  Remains very stable on [facility] ECMO. Has been on NE 20 for BP support. CVP 11.  Sedated on vent but reportedly woke up and followed some commands this afternoon.  No further bleeding.  Objective: Vital signs in last 24 hours: Temp:  [96.5 F (35.8 C)-97.9 F (36.6 C)] 97.5 F (36.4 C) (08/22 1430) Pulse Rate:  [37-85] 47 (08/22 1430) Cardiac Rhythm: Normal sinus rhythm;Sinus bradycardia;Ventricular paced (08/22 0800) Resp:  [0-16] 16 (08/22 1430) BP: (98)/(77) 98/77 (08/22 0353) SpO2:  [96 %-100 %] 100 % (08/22 1430) Arterial Line BP: (73-99)/(60-75) 87/70 (08/22 1430) FiO2 (%):  [21 %] 21 % (08/22 1528) Weight:  [105.2 kg] 105.2 kg (08/22 0500)  Hemodynamic parameters for last 24 hours: CVP:  [10 mmHg-18 mmHg] 11 mmHg  Intake/Output from previous day: 08/21 0701 - 08/22 0700 In: 4115.4 [I.V.:1453.6; Blood:1263.7; NG/GT:597.3; IV Piggyback:800.8] Out: 0240 [XBDZH:2992; Drains:360; Chest Tube:2121] Intake/Output this shift: Total I/O In: 1262.3 [I.V.:672.3; NG/GT:370; IV Piggyback:200.1] Out: 445 [Urine:295; Chest Tube:150]  General appearance: intubated on vent. Neurologic: sedated. Unable to assess myself but reportedly following some commands. Heart: regular rate and rhythm Lungs: clear to auscultation bilaterally Abdomen: soft, bowel sounds present Extremities: edema moderate diffuse Wound: chest dressing dry. Chest tube output low.  Lab Results: Recent Labs    10/01/21 0430 10/01/21 0431 10/01/21 1616 10/01/21 1623  WBC 16.0*  --  15.0*  --   HGB 8.1*   < > 7.3* 9.2*  HCT 23.2*   < > 21.1* 27.0*  PLT 99*  --  101*  --    < > = values in this interval not displayed.   BMET:  Recent Labs    10/01/21 0430 10/01/21 0431 10/01/21 1616 10/01/21 1623  NA 138   < > 141 140  K 4.1   < > 4.3 4.3  CL 109  --  108  --   CO2 26  --  28  --   GLUCOSE 196*  --  183*   --   BUN 39*  --  43*  --   CREATININE 2.47*  --  2.62*  --   CALCIUM 7.2*  --  7.4*  --    < > = values in this interval not displayed.    PT/INR:  Recent Labs    10/01/21 0430  LABPROT 22.0*  INR 2.0*   ABG    Component Value Date/Time   PHART 7.362 10/01/2021 1623   HCO3 31.1 (H) 10/01/2021 1623   TCO2 33 (H) 10/01/2021 1623   ACIDBASEDEF 5.0 (H) 09/29/2021 1443   O2SAT 100 10/01/2021 1623   CBG (last 3)  Recent Labs    10/01/21 0841 10/01/21 1056 10/01/21 1621  GLUCAP 198* 172* 163*    Assessment/Plan:  Shock: mixed cardiogenic/hemorrhagic due to mediastinal bleeding of unclear etiology while on Eliquis for AF. 2D echo today showed EF 20-25% with moderate RV dysfunction but he really needs a TEE to get a good assessment. LVEF was 50% on echo 09/28/21 and >65% on initially operative TEE. Plan TEE tomorrow and possible decannulation Thursday in OR.  CKD with AKI: baseline creat was 1.5 but has gone up to 2.62 this evening. He is still making 30/hr of urine with no diuretic.  PAF: sinus brady now on IV amio.  DM: previously  poorly controled with Hgb A1c 14-15 last fall but recently under good control.  Acute blood loss anemia:  Transfusing as needed to keep Hgb > 8.0.    LOS: 3 days    [name] 10/01/2021

## 2021-10-01 NOTE — Progress Notes (Signed)
PT Cancellation Note  Patient Details Name: [name] MRN: [id number] DOB: 1960/03/07   Cancelled Treatment:    Reason Eval/Treat Not Completed: Patient not medically ready. Will sign off. Please reorder as appropriate   Temesha Queener B Billi Bright 10/01/2021, 10:22 AM [place] Office: [phone]

## 2021-10-02 ENCOUNTER — Inpatient Hospital Stay (HOSPITAL_COMMUNITY): Payer: Self-pay

## 2021-10-02 DIAGNOSIS — I509 Heart failure, unspecified: Secondary | ICD-10-CM

## 2021-10-02 DIAGNOSIS — I3139 Other pericardial effusion (noninflammatory): Secondary | ICD-10-CM

## 2021-10-02 DIAGNOSIS — Z515 Encounter for palliative care: Secondary | ICD-10-CM

## 2021-10-02 LAB — HEPATIC FUNCTION PANEL
ALT: 17 U/L (ref 0–44)
AST: 43 U/L — ABNORMAL HIGH (ref 15–41)
Albumin: 2 g/dL — ABNORMAL LOW (ref 3.5–5.0)
Alkaline Phosphatase: 74 U/L (ref 38–126)
Bilirubin, Direct: 0.6 mg/dL — ABNORMAL HIGH (ref 0.0–0.2)
Indirect Bilirubin: 0.6 mg/dL (ref 0.3–0.9)
Total Bilirubin: 1.2 mg/dL (ref 0.3–1.2)
Total Protein: 3.8 g/dL — ABNORMAL LOW (ref 6.5–8.1)

## 2021-10-02 LAB — POCT I-STAT 7, (LYTES, BLD GAS, ICA,H+H)
Acid-Base Excess: 4 mmol/L — ABNORMAL HIGH (ref 0.0–2.0)
Acid-Base Excess: 5 mmol/L — ABNORMAL HIGH (ref 0.0–2.0)
Acid-Base Excess: 5 mmol/L — ABNORMAL HIGH (ref 0.0–2.0)
Acid-Base Excess: 5 mmol/L — ABNORMAL HIGH (ref 0.0–2.0)
Acid-Base Excess: 6 mmol/L — ABNORMAL HIGH (ref 0.0–2.0)
Acid-Base Excess: 6 mmol/L — ABNORMAL HIGH (ref 0.0–2.0)
Acid-Base Excess: 7 mmol/L — ABNORMAL HIGH (ref 0.0–2.0)
Bicarbonate: 29.3 mmol/L — ABNORMAL HIGH (ref 20.0–28.0)
Bicarbonate: 29.9 mmol/L — ABNORMAL HIGH (ref 20.0–28.0)
Bicarbonate: 30.2 mmol/L — ABNORMAL HIGH (ref 20.0–28.0)
Bicarbonate: 30.6 mmol/L — ABNORMAL HIGH (ref 20.0–28.0)
Bicarbonate: 30.7 mmol/L — ABNORMAL HIGH (ref 20.0–28.0)
Bicarbonate: 30.9 mmol/L — ABNORMAL HIGH (ref 20.0–28.0)
Bicarbonate: 32.4 mmol/L — ABNORMAL HIGH (ref 20.0–28.0)
Calcium, Ion: 1.09 mmol/L — ABNORMAL LOW (ref 1.15–1.40)
Calcium, Ion: 1.1 mmol/L — ABNORMAL LOW (ref 1.15–1.40)
Calcium, Ion: 1.1 mmol/L — ABNORMAL LOW (ref 1.15–1.40)
Calcium, Ion: 1.1 mmol/L — ABNORMAL LOW (ref 1.15–1.40)
Calcium, Ion: 1.12 mmol/L — ABNORMAL LOW (ref 1.15–1.40)
Calcium, Ion: 1.13 mmol/L — ABNORMAL LOW (ref 1.15–1.40)
Calcium, Ion: 1.14 mmol/L — ABNORMAL LOW (ref 1.15–1.40)
HCT: 18 % — ABNORMAL LOW (ref 39.0–52.0)
HCT: 19 % — ABNORMAL LOW (ref 39.0–52.0)
HCT: 21 % — ABNORMAL LOW (ref 39.0–52.0)
HCT: 23 % — ABNORMAL LOW (ref 39.0–52.0)
HCT: 23 % — ABNORMAL LOW (ref 39.0–52.0)
HCT: 24 % — ABNORMAL LOW (ref 39.0–52.0)
HCT: 24 % — ABNORMAL LOW (ref 39.0–52.0)
Hemoglobin: 6.1 g/dL — CL (ref 13.0–17.0)
Hemoglobin: 6.5 g/dL — CL (ref 13.0–17.0)
Hemoglobin: 7.1 g/dL — ABNORMAL LOW (ref 13.0–17.0)
Hemoglobin: 7.8 g/dL — ABNORMAL LOW (ref 13.0–17.0)
Hemoglobin: 7.8 g/dL — ABNORMAL LOW (ref 13.0–17.0)
Hemoglobin: 8.2 g/dL — ABNORMAL LOW (ref 13.0–17.0)
Hemoglobin: 8.2 g/dL — ABNORMAL LOW (ref 13.0–17.0)
O2 Saturation: 100 %
O2 Saturation: 100 %
O2 Saturation: 100 %
O2 Saturation: 100 %
O2 Saturation: 100 %
O2 Saturation: 100 %
O2 Saturation: 100 %
Patient temperature: 36.4
Patient temperature: 36.4
Patient temperature: 36.4
Patient temperature: 36.5
Patient temperature: 36.5
Patient temperature: 36.5
Patient temperature: 36.5
Potassium: 3.6 mmol/L (ref 3.5–5.1)
Potassium: 3.6 mmol/L (ref 3.5–5.1)
Potassium: 3.8 mmol/L (ref 3.5–5.1)
Potassium: 4 mmol/L (ref 3.5–5.1)
Potassium: 4.1 mmol/L (ref 3.5–5.1)
Potassium: 4.1 mmol/L (ref 3.5–5.1)
Potassium: 4.3 mmol/L (ref 3.5–5.1)
Sodium: 140 mmol/L (ref 135–145)
Sodium: 140 mmol/L (ref 135–145)
Sodium: 140 mmol/L (ref 135–145)
Sodium: 140 mmol/L (ref 135–145)
Sodium: 141 mmol/L (ref 135–145)
Sodium: 141 mmol/L (ref 135–145)
Sodium: 142 mmol/L (ref 135–145)
TCO2: 31 mmol/L (ref 22–32)
TCO2: 31 mmol/L (ref 22–32)
TCO2: 32 mmol/L (ref 22–32)
TCO2: 32 mmol/L (ref 22–32)
TCO2: 32 mmol/L (ref 22–32)
TCO2: 32 mmol/L (ref 22–32)
TCO2: 34 mmol/L — ABNORMAL HIGH (ref 22–32)
pCO2 arterial: 42.5 mmHg (ref 32–48)
pCO2 arterial: 43.5 mmHg (ref 32–48)
pCO2 arterial: 43.6 mmHg (ref 32–48)
pCO2 arterial: 44.3 mmHg (ref 32–48)
pCO2 arterial: 50.6 mmHg — ABNORMAL HIGH (ref 32–48)
pCO2 arterial: 52 mmHg — ABNORMAL HIGH (ref 32–48)
pCO2 arterial: 54.4 mmHg — ABNORMAL HIGH (ref 32–48)
pH, Arterial: 7.377 (ref 7.35–7.45)
pH, Arterial: 7.381 (ref 7.35–7.45)
pH, Arterial: 7.381 (ref 7.35–7.45)
pH, Arterial: 7.426 (ref 7.35–7.45)
pH, Arterial: 7.441 (ref 7.35–7.45)
pH, Arterial: 7.453 — ABNORMAL HIGH (ref 7.35–7.45)
pH, Arterial: 7.467 — ABNORMAL HIGH (ref 7.35–7.45)
pO2, Arterial: 422 mmHg — ABNORMAL HIGH (ref 83–108)
pO2, Arterial: 454 mmHg — ABNORMAL HIGH (ref 83–108)
pO2, Arterial: 455 mmHg — ABNORMAL HIGH (ref 83–108)
pO2, Arterial: 462 mmHg — ABNORMAL HIGH (ref 83–108)
pO2, Arterial: 464 mmHg — ABNORMAL HIGH (ref 83–108)
pO2, Arterial: 471 mmHg — ABNORMAL HIGH (ref 83–108)
pO2, Arterial: 486 mmHg — ABNORMAL HIGH (ref 83–108)

## 2021-10-02 LAB — GLUCOSE, CAPILLARY
Glucose-Capillary: 127 mg/dL — ABNORMAL HIGH (ref 70–99)
Glucose-Capillary: 147 mg/dL — ABNORMAL HIGH (ref 70–99)
Glucose-Capillary: 148 mg/dL — ABNORMAL HIGH (ref 70–99)
Glucose-Capillary: 153 mg/dL — ABNORMAL HIGH (ref 70–99)
Glucose-Capillary: 165 mg/dL — ABNORMAL HIGH (ref 70–99)
Glucose-Capillary: 165 mg/dL — ABNORMAL HIGH (ref 70–99)
Glucose-Capillary: 169 mg/dL — ABNORMAL HIGH (ref 70–99)

## 2021-10-02 LAB — BASIC METABOLIC PANEL
Anion gap: 5 (ref 5–15)
Anion gap: 6 (ref 5–15)
Anion gap: 8 (ref 5–15)
BUN: 47 mg/dL — ABNORMAL HIGH (ref 8–23)
BUN: 51 mg/dL — ABNORMAL HIGH (ref 8–23)
BUN: 51 mg/dL — ABNORMAL HIGH (ref 8–23)
CO2: 26 mmol/L (ref 22–32)
CO2: 25 mmol/L (ref 22–32)
CO2: 24 mmol/L (ref 22–32)
Calcium: 7.4 mg/dL — ABNORMAL LOW (ref 8.9–10.3)
Calcium: 7.2 mg/dL — ABNORMAL LOW (ref 8.9–10.3)
Calcium: 7.1 mg/dL — ABNORMAL LOW (ref 8.9–10.3)
Chloride: 109 mmol/L (ref 98–111)
Chloride: 109 mmol/L (ref 98–111)
Chloride: 110 mmol/L (ref 98–111)
Creatinine, Ser: 2.77 mg/dL — ABNORMAL HIGH (ref 0.61–1.24)
Creatinine, Ser: 2.73 mg/dL — ABNORMAL HIGH (ref 0.61–1.24)
Creatinine, Ser: 2.71 mg/dL — ABNORMAL HIGH (ref 0.61–1.24)
GFR, Estimated: 25 mL/min — ABNORMAL LOW (ref >60)
GFR, Estimated: 26 mL/min — ABNORMAL LOW (ref >60)
GFR, Estimated: 26 mL/min — ABNORMAL LOW (ref >60)
Glucose, Bld: 177 mg/dL — ABNORMAL HIGH (ref 70–99)
Glucose, Bld: 175 mg/dL — ABNORMAL HIGH (ref 70–99)
Glucose, Bld: 162 mg/dL — ABNORMAL HIGH (ref 70–99)
Potassium: 4.2 mmol/L (ref 3.5–5.1)
Potassium: 3.6 mmol/L (ref 3.5–5.1)
Potassium: 3.7 mmol/L (ref 3.5–5.1)
Sodium: 140 mmol/L (ref 135–145)
Sodium: 140 mmol/L (ref 135–145)
Sodium: 142 mmol/L (ref 135–145)

## 2021-10-02 LAB — VANCOMYCIN, TROUGH: Vancomycin Tr: 21 ug/mL (ref 15–20)

## 2021-10-02 LAB — CBC
HCT: 19.4 % — ABNORMAL LOW (ref 39.0–52.0)
HCT: 22.2 % — ABNORMAL LOW (ref 39.0–52.0)
HCT: 24.4 % — ABNORMAL LOW (ref 39.0–52.0)
HCT: 23.1 % — ABNORMAL LOW (ref 39.0–52.0)
Hemoglobin: 6.5 g/dL — CL (ref 13.0–17.0)
Hemoglobin: 7.3 g/dL — ABNORMAL LOW (ref 13.0–17.0)
Hemoglobin: 8.2 g/dL — ABNORMAL LOW (ref 13.0–17.0)
Hemoglobin: 7.7 g/dL — ABNORMAL LOW (ref 13.0–17.0)
MCH: 29.3 pg (ref 26.0–34.0)
MCH: 28.2 pg (ref 26.0–34.0)
MCH: 28.3 pg (ref 26.0–34.0)
MCH: 28.2 pg (ref 26.0–34.0)
MCHC: 33.5 g/dL (ref 30.0–36.0)
MCHC: 32.9 g/dL (ref 30.0–36.0)
MCHC: 33.6 g/dL (ref 30.0–36.0)
MCHC: 33.3 g/dL (ref 30.0–36.0)
MCV: 87.4 fL (ref 80.0–100.0)
MCV: 85.7 fL (ref 80.0–100.0)
MCV: 84.1 fL (ref 80.0–100.0)
MCV: 84.6 fL (ref 80.0–100.0)
Platelets: 97 10*3/uL — ABNORMAL LOW (ref 150–400)
Platelets: 93 10*3/uL — ABNORMAL LOW (ref 150–400)
Platelets: 89 10*3/uL — ABNORMAL LOW (ref 150–400)
Platelets: 86 10*3/uL — ABNORMAL LOW (ref 150–400)
RBC: 2.22 MIL/uL — ABNORMAL LOW (ref 4.22–5.81)
RBC: 2.59 MIL/uL — ABNORMAL LOW (ref 4.22–5.81)
RBC: 2.9 MIL/uL — ABNORMAL LOW (ref 4.22–5.81)
RBC: 2.73 MIL/uL — ABNORMAL LOW (ref 4.22–5.81)
RDW: 15.6 % — ABNORMAL HIGH (ref 11.5–15.5)
RDW: 17.2 % — ABNORMAL HIGH (ref 11.5–15.5)
RDW: 17.1 % — ABNORMAL HIGH (ref 11.5–15.5)
RDW: 17.1 % — ABNORMAL HIGH (ref 11.5–15.5)
WBC: 12.5 10*3/uL — ABNORMAL HIGH (ref 4.0–10.5)
WBC: 10.8 10*3/uL — ABNORMAL HIGH (ref 4.0–10.5)
WBC: 10.3 10*3/uL (ref 4.0–10.5)
WBC: 10.2 10*3/uL (ref 4.0–10.5)
nRBC: 0.4 % — ABNORMAL HIGH (ref 0.0–0.2)
nRBC: 0.5 % — ABNORMAL HIGH (ref 0.0–0.2)
nRBC: 0.6 % — ABNORMAL HIGH (ref 0.0–0.2)
nRBC: 0.6 % — ABNORMAL HIGH (ref 0.0–0.2)

## 2021-10-02 LAB — GLOBAL TEG PANEL
CFF Max Amplitude: 25.4 mm (ref 15–32)
CK with Heparinase (R): 10.2 min — ABNORMAL HIGH (ref 4.3–8.3)
Citrated Functional Fibrinogen: 463.5 mg/dL (ref 278–581)
Citrated Kaolin (K): 1.8 min (ref 0.8–2.1)
Citrated Kaolin (MA): 63.6 mm (ref 52–69)
Citrated Kaolin (R): 10.7 min — ABNORMAL HIGH (ref 4.6–9.1)
Citrated Kaolin Angle: 66.5 deg (ref 63–78)
Citrated Rapid TEG (MA): 62.3 mm (ref 52–70)

## 2021-10-02 LAB — APTT: aPTT: 40 seconds — ABNORMAL HIGH (ref 24–36)

## 2021-10-02 LAB — MAGNESIUM
Magnesium: 2.7 mg/dL — ABNORMAL HIGH (ref 1.7–2.4)
Magnesium: 2.5 mg/dL — ABNORMAL HIGH (ref 1.7–2.4)

## 2021-10-02 LAB — FIBRINOGEN: Fibrinogen: 428 mg/dL (ref 210–475)

## 2021-10-02 LAB — PREPARE RBC (CROSSMATCH)

## 2021-10-02 LAB — PROTIME-INR
INR: 1.7 — ABNORMAL HIGH (ref 0.8–1.2)
Prothrombin Time: 19.4 seconds — ABNORMAL HIGH (ref 11.4–15.2)

## 2021-10-02 LAB — PHOSPHORUS: Phosphorus: 4.2 mg/dL (ref 2.5–4.6)

## 2021-10-02 LAB — LACTATE DEHYDROGENASE: LDH: 192 U/L (ref 98–192)

## 2021-10-02 MED ORDER — SODIUM CHLORIDE 0.9% IV SOLUTION: Freq: Once | INTRAVENOUS | Status: AC

## 2021-10-02 MED ORDER — CEFAZOLIN SODIUM-DEXTROSE 2-4 GM/100ML-% IV SOLN
2.0000 g | INTRAVENOUS | Status: AC
  Administered 2021-10-03: 2 g via INTRAVENOUS

## 2021-10-02 MED ORDER — POTASSIUM CHLORIDE 20 MEQ PO PACK
40.0000 meq | PACK | Freq: Once | ORAL | Status: AC
  Administered 2021-10-02: 40 meq
  Filled 2021-10-02: qty 2

## 2021-10-02 MED ORDER — PIVOT 1.5 CAL PO LIQD
1000.0000 mL | ORAL | Status: AC
  Administered 2021-10-02: 1000 mL

## 2021-10-02 MED ORDER — FUROSEMIDE 10 MG/ML IJ SOLN
40.0000 mg | Freq: Once | INTRAMUSCULAR | Status: DC
  Filled 2021-10-02: qty 4

## 2021-10-02 MED ORDER — SODIUM CHLORIDE 0.9 % IV SOLN: INTRAVENOUS | Status: DC

## 2021-10-02 MED ORDER — FUROSEMIDE 10 MG/ML IJ SOLN
40.0000 mg | Freq: Once | INTRAMUSCULAR | Status: AC
  Administered 2021-10-02: 40 mg via INTRAVENOUS
  Filled 2021-10-02: qty 4

## 2021-10-02 MED ORDER — SODIUM CHLORIDE 0.9% IV SOLUTION
Freq: Once | INTRAVENOUS | Status: AC
Start: 2021-10-02 — End: 2021-10-02

## 2021-10-02 MED ORDER — POTASSIUM CHLORIDE 10 MEQ/50ML IV SOLN
10.0000 meq | INTRAVENOUS | Status: AC
  Administered 2021-10-02 – 2021-10-03 (×4): 10 meq via INTRAVENOUS
  Filled 2021-10-02: qty 50

## 2021-10-02 MED ORDER — FUROSEMIDE 10 MG/ML IJ SOLN
40.0000 mg | Freq: Once | INTRAMUSCULAR | Status: AC
  Administered 2021-10-02: 40 mg via INTRAVENOUS

## 2021-10-02 NOTE — Progress Notes (Signed)
Pharmacy Antibiotic Note  [name] is a 61 y.o. male admitted on 09/28/2021 s/p pericardial window > now with open chest/ECMO  Pharmacy has been consulted for vancomycin and meropenem dosing.  Received vancomycin 1gm prior to OR 8/19 and 1500 mg vancomycin in OR 8/20 pm > vancomycin 1gm IV q24h with Vancomycin trough 21  - slightly > goal with Cr slightly elevated today 2.77 - plans to go to OR tomorrow for a wash out and diuretics today  - anticipate improving renal function  Will continue current antibiotic dosing - will reassess in am and adjust based on renal function as needed   Plan: Meropenem 1g q 12 hrs for now, will adjust as needed with renal function. Vancomycin 1gm IV q24hr  - monitor renal function for dosing needs  Height: 5\' 7"  (170.2 cm) Weight: 102.4 kg (225 lb 12 oz) IBW/kg (Calculated) : 66.1  Temp (24hrs), Avg:97.6 F (36.4 C), Min:97.5 F (36.4 C), Max:97.9 F (36.6 C)  Recent Labs  Lab 09/29/21 1635 09/29/21 1657 09/29/21 1719 09/29/21 1922 09/29/21 1956 09/30/21 0933 09/30/21 1140 09/30/21 1650 10/01/21 0430 10/01/21 1616 10/02/21 0120 10/02/21 0354 10/02/21 0527 10/02/21 1200 10/02/21 1208  WBC  --    < >  --   --    < >  --  12.0* 16.0* 16.0* 15.0* 12.5*  --  10.8* 10.3  --   CREATININE  --   --  3.05*  --    < >  --  2.48* 2.44* 2.47* 2.62*  --  2.77*  --   --   --   LATICACIDVEN 5.6*  --  4.6* 2.3*  --   --  1.4  --  1.2  --   --   --   --   --   --   VANCOTROUGH  --   --   --   --   --   --   --   --   --   --   --   --   --   --  21*  VANCORANDOM  --   --   --   --   --  16  --   --   --   --   --   --   --   --   --    < > = values in this interval not displayed.     Estimated Creatinine Clearance: 31.9 mL/min (A) (by C-G formula based on SCr of 2.77 mg/dL (H)).    No Known Allergies  Antimicrobials this admission:  8/20 Vancomycin > 8/23 VT 21  8/20 Meropenem >   Dose adjustments this admission:   Microbiology results:   None    Bonnita Nasuti Pharm.D. CPP, BCPS Clinical Pharmacist [phone] 10/02/2021 2:05 PM     [facility] pharmacy phone numbers are listed [email or web address]

## 2021-10-02 NOTE — Progress Notes (Signed)
Advanced Heart Failure Rounding Note   Subjective:    - 8/19 Pericardial window - 8/20 Cardiac arrest with tamponade -> Emergent bedside washout - 09/29/21 VA Cannulation - 09/30/21 Return to OR for mediastinal hemorrhage - 10/01/21 Developed AF -> amio  Remains intubated/sedated. On VA ECMO with open chest. Off heparin. Circuit flowing well. LDH ok.   On NE 8  In/out AF on amio.   Bleeding has essentially resolved but hgb still drifting down.   SCr 2.4 -> 2.7  Apparently followed commands yesterday   Objective:   Weight Range:  Vital Signs:   Temp:  [96.5 F (35.8 C)-97.9 F (36.6 C)] 97.5 F (36.4 C) (08/23 0903) Pulse Rate:  [30-55] 53 (08/23 0903) Resp:  [0-20] 20 (08/23 0903) SpO2:  [98 %-100 %] 100 % (08/23 0903) Arterial Line BP: (75-123)/(55-82) 88/66 (08/23 0903) FiO2 (%):  [21 %] 21 % (08/23 0848) Weight:  [102.4 kg] 102.4 kg (08/23 0545) Last BM Date :  (PTA)  Weight change: Filed Weights   09/29/21 0500 10/01/21 0500 10/02/21 0545  Weight: 102 kg 105.2 kg 102.4 kg    Intake/Output:   Intake/Output Summary (Last 24 hours) at 10/02/2021 1324 Last data filed at 10/02/2021 4010 Gross per 24 hour  Intake 3024.48 ml  Output 1030 ml  Net 1994.48 ml      Physical Exam: General:  Intubated/sedated HEENT: normal + ETT Neck: supple. + RIJ/LIJ central lines Cor: Chest open with CTs and aortic return cannula Lungs: mild crackles Abdomen: soft, nontender, nondistended. No hepatosplenomegaly. No bruits or masses. Good bowel sounds. Extremities: no cyanosis, clubbing, rash, 2+ edema  + RFV venous cannula Neuro: intubated/sedated   Telemetry: Sinus brady 50s + PACs. Occasional AF Personally reviewed  Labs: Basic Metabolic Panel: Recent Labs  Lab 09/29/21 1154 09/29/21 1244 09/30/21 1140 09/30/21 1147 09/30/21 1650 09/30/21 1936 10/01/21 0430 10/01/21 0431 10/01/21 1616 10/01/21 1623 10/02/21 0017 10/02/21 0354 10/02/21 0528  10/02/21 0812 10/02/21 0911  NA 136   < > 138   < > 139   < > 138   < > 141   < > 142 140 140 140 141  K 4.0   < > 3.8   < > 4.0   < > 4.1   < > 4.3   < > 4.3 4.2 4.1 4.1 4.0  CL 100   < > 105  --  106  --  109  --  108  --   --  109  --   --   --   CO2 21*   < > 26  --  28  --  26  --  28  --   --  26  --   --   --   GLUCOSE 121*   < > 136*  --  126*  --  196*  --  183*  --   --  177*  --   --   --   BUN 54*   < > 38*  --  38*  --  39*  --  43*  --   --  47*  --   --   --   CREATININE 3.28*   < > 2.48*  --  2.44*  --  2.47*  --  2.62*  --   --  2.77*  --   --   --   CALCIUM 8.6*   < > 7.6*  --  7.6*  --  7.2*  --  7.4*  --   --  7.4*  --   --   --   MG 2.2  --   --   --  1.7  --  2.6*  --  2.8*  --   --  2.7*  --   --   --   PHOS  --   --   --   --  4.6  --  4.5  --  4.3  --   --  4.2  --   --   --    < > = values in this interval not displayed.     Liver Function Tests: Recent Labs  Lab 09/29/21 1719 09/29/21 1956 09/30/21 0835 10/01/21 0430 10/02/21 0354  AST 247* 197* 94* 45* 43*  ALT 126* 82* 23 19 17   ALKPHOS 65 53 31* 43 74  BILITOT 1.2 1.7* 2.2* 2.3* 1.2  PROT 3.9* 3.7* 3.9* 3.6* 3.8*  ALBUMIN 2.2* 2.0* 2.9* 2.3* 2.0*    No results for input(s): "LIPASE", "AMYLASE" in the last 168 hours. No results for input(s): "AMMONIA" in the last 168 hours.  CBC: Recent Labs  Lab 09/29/21 1956 09/29/21 2152 09/30/21 1650 09/30/21 1936 10/01/21 0430 10/01/21 0431 10/01/21 1616 10/01/21 1623 10/02/21 0120 10/02/21 0527 10/02/21 0528 10/02/21 0812 10/02/21 0911  WBC 17.7*   < > 16.0*  --  16.0*  --  15.0*  --  12.5* 10.8*  --   --   --   NEUTROABS 13.0*  --   --   --   --   --   --   --   --   --   --   --   --   HGB 8.2*   < > 8.1*   < > 8.1*   < > 7.3*   < > 6.5* 7.3* 6.5* 7.1* 7.8*  HCT 24.4*   < > 23.2*   < > 23.2*   < > 21.1*   < > 19.4* 22.2* 19.0* 21.0* 23.0*  MCV 82.2   < > 84.4  --  85.9  --  86.5  --  87.4 85.7  --   --   --   PLT 258   < > 100*  --  99*  --   101*  --  97* 93*  --   --   --    < > = values in this interval not displayed.     Cardiac Enzymes: No results for input(s): "CKTOTAL", "CKMB", "CKMBINDEX", "TROPONINI" in the last 168 hours.  BNP: BNP (last 3 results) Recent Labs    12/24/20 0651 09/28/21 0405  BNP 412.0* 826.6*     ProBNP (last 3 results) No results for input(s): "PROBNP" in the last 8760 hours.    Other results:  Imaging: DG CHEST PORT 1 VIEW  Result Date: 10/02/2021 CLINICAL DATA:  ET and chest tubes on ECMO. EXAM: PORTABLE CHEST 1 VIEW COMPARISON:  Radiograph October 01, 2021 FINDINGS: Endotracheal tube in the distal thoracic trachea proximally 2.5 cm from the carina. Enteric feeding catheter courses below the diaphragm with tip obscured by collimation. Stable mediastinal and thoracic drains. Stable position of the ECMO cannula. Stable bilateral IJ central venous catheters. Left-sided pigtail thoracostomy tube. Similar cardiac enlargement. No significant interval change in the opacities in the left lung base which may reflect atelectasis or consolidation. Right lung is predominantly clear. No visible pneumothorax. IMPRESSION: 1. Stable lines and tubes. 2. No significant interval change in cardiomegaly or  opacity in the left lung base. Electronically Signed   By: Dahlia Bailiff M.D.   On: 10/02/2021 08:17   DG CHEST PORT 1 VIEW  Result Date: 10/01/2021 CLINICAL DATA:  [id number]; ETT, chest tube evaluation. Patient is on ECMO EXAM: PORTABLE CHEST 1 VIEW COMPARISON:  September 30, 2021 FINDINGS: Again seen is the endotracheal tube with its tip about 3.5 cm above the carina, satisfactory and stable. Tip of the feeding tube is in the stomach, stable. Stable right transjugular central venous catheter and large bore central venous catheter inserted by the femoral venous approach with its distal end at the mid SVC region. Stable left transjugular central venous catheter with its tip at the confluence of the innominate veins.  Stable left pleural pigtail catheter. Again seen are the mediastinal drainage tubes. Moderate enlargement of the cardiac silhouette, stable. There is continued obliteration of the left hemidiaphragm seen likely secondary to atelectasis-infiltrations. There has been improvement of the opacity at the right lung base. The visualized skeletal structures are unremarkable. IMPRESSION: 1.  Life supporting apparatus are stable. 2.  Moderate enlargement of the cardiac silhouette, stable. 3. Continued opacification at the left lung base of atelectasis-infiltrations. 4. There has been improvement of the opacity at the right lung base. Electronically Signed   By: Frazier Richards M.D.   On: 10/01/2021 08:32   DG CHEST PORT 1 VIEW  Result Date: 09/30/2021 CLINICAL DATA:  Postoperative state EXAM: PORTABLE CHEST 1 VIEW COMPARISON:  Previous studies including the examination done earlier today FINDINGS: Transverse diameter of heart is increased. Increased density in left lower lung field may suggest pleural effusion and possibly underlying infiltrates. There is mediastinal drain. Left chest tube is noted. Tip of endotracheal tube is 3.6 cm above the carina. Distal portion of enteric tube is seen in the stomach. There is a large caliber catheter in the course of left subclavian. There is another large caliber catheter in the course of the inferior vena cava, right atrium and superior vena cava. Tip of right IJ central venous catheter is seen in the region of superior vena cava. IMPRESSION: Increased density in left lower lung fields suggests pleural effusion with possible interval increase. There are no signs of alveolar pulmonary edema. Other findings as described in the body of the report. Electronically Signed   By: Elmer Picker M.D.   On: 09/30/2021 17:40   DG Abd 1 View  Result Date: 09/30/2021 CLINICAL DATA:  Feeding tube placement EXAM: ABDOMEN - 1 VIEW COMPARISON:  Previous study including the examination done  earlier today FINDINGS: Tip of feeding tube is noted in the region of proximal antrum of the stomach with the tip pointing to the right. In the previous study, tip of feeding tube pointing to the left. There is increased density in left lower lung field. There is dilation of small-bowel loops. This finding is not fully evaluated. IMPRESSION: Tip of feeding tube is seen in the region of antrum of the stomach. Electronically Signed   By: Elmer Picker M.D.   On: 09/30/2021 17:35   DG Abd Portable 1V  Result Date: 09/30/2021 CLINICAL DATA:  Feeding tube placement. EXAM: PORTABLE ABDOMEN - 1 VIEW COMPARISON:  Chest radiographs 09/30/2021 and 09/29/2021. FINDINGS: 1249 hours. A feeding tube is looped over the stomach with the tip projected laterally in the mid stomach. ECMO catheter projects over the IVC and right atrium, tip not visualized. The bowel gas pattern is nonobstructive. IMPRESSION: Tip of the feeding tube projects over the  mid stomach. Electronically Signed   By: Richardean Sale M.D.   On: 09/30/2021 13:28     Medications:     Scheduled Medications:  aspirin EC  325 mg Oral BID   Or   aspirin  324 mg Per Tube BID   atorvastatin  80 mg Per Tube Daily   bisacodyl  10 mg Oral Daily   Or   bisacodyl  10 mg Rectal Daily   Chlorhexidine Gluconate Cloth  6 each Topical Daily   colchicine  0.6 mg Per Tube Daily   docusate  100 mg Per Tube BID   enoxaparin (LOVENOX) injection  30 mg Subcutaneous Q24H   feeding supplement (PROSource TF20)  60 mL Per Tube QID   fentaNYL (SUBLIMAZE) injection  50 mcg Intravenous Once   insulin aspart  2-6 Units Subcutaneous Q4H   insulin detemir  5 Units Subcutaneous Q12H   mouth rinse  15 mL Mouth Rinse Q2H   pantoprazole (PROTONIX) IV  40 mg Intravenous QHS   polyethylene glycol  17 g Per Tube Daily   sodium chloride flush  10-40 mL Intracatheter Q12H   sodium chloride flush  3 mL Intravenous Q12H    Infusions:  sodium chloride     albumin human      amiodarone Stopped (09/29/21 1417)   amiodarone 30 mg/hr (10/02/21 0900)   feeding supplement (PIVOT 1.5 CAL) 20 mL/hr at 10/02/21 0900   fentaNYL infusion INTRAVENOUS 200 mcg/hr (10/02/21 0900)   meropenem (MERREM) IV Stopped (10/02/21 0612)   midazolam 6 mg/hr (10/02/21 0909)   norepinephrine (LEVOPHED) Adult infusion 8 mcg/min (10/02/21 0900)   vancomycin Stopped (10/01/21 1426)    PRN Medications: acetaminophen, albumin human, artificial tears, fentaNYL, ipratropium-albuterol, metoprolol tartrate, midazolam, midazolam, nitroGLYCERIN, ondansetron (ZOFRAN) IV, mouth rinse, oxyCODONE, sodium chloride flush, traMADol   Assessment/Plan:    1. Shock - mixed cardiogenic/hemorrhagic - continue VA ECMO support + pressors as needed. No heparin - Transfuse keep hgb >= 8.0 - TEE today LVEF 25-30% RV mild to moderately down - diurese today - Possible decannulation tomorrow in OR 2. Cardiac arrest (PEA/bradycardic) - 8/19 in setting of tamponade 3. Cardiac tamponade with emergent bedside sternotomy  4. Hemorrhagic shock - return to OR 8/21 - bleeding now stabilized but hgb trending down - transfuse to keep Hgb >= 8.0 5. Acute hypoxemic respiratory failure - Now on VA ECMO - Vent support per CCM - adjusted to day on rounds 6. AKI due to ATN - up slightly 2.4- > 2.7 today 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 DM2 - continue SSI 10. PAF - continue amio  CRITICAL CARE Performed by: Glori Bickers  Total critical care time: 50 minutes  Critical care time was exclusive of separately billable procedures and treating other patients.  Critical care was necessary to treat or prevent imminent or life-threatening deterioration.  Critical care was time spent personally by me (independent of midlevel providers or residents) on the following activities: development of treatment plan  with patient and/or surrogate as well as nursing, discussions with consultants, evaluation of patient's response to treatment, examination of patient, obtaining history from patient or surrogate, ordering and performing treatments and interventions, ordering and review of laboratory studies, ordering and review of radiographic studies, pulse oximetry and re-evaluation of patient's condition.  Length of Stay: [place] 10/02/2021, 9:23 AM  Advanced Heart Failure Team Pager [phone] (M-F;  7a - 4p)  Please contact [facility] for night-coverage after hours (4p -7a ) and weekends on [email or web address]

## 2021-10-02 NOTE — Progress Notes (Signed)
  Echocardiogram Echocardiogram Transesophageal has been performed.  [name] 10/02/2021, 9:43 AM

## 2021-10-02 NOTE — Progress Notes (Signed)
2 Days Post-Op Procedure(s) (LRB): EXPLORATION POST OPERATIVE OPEN HEART WASHOUT (N/A) Subjective: Patient relatively stable past 24 hours requiring only 2 units of blood and maintain excellent cardiac output while norepinephrine has been decreased. 2D echocardiogram was performed this morning which I have reviewed and discussed with Dr. Haroldine Laws for coordination of care. The patient's biventricular function is still not adequate for decannulation of ECMO support.  The patient would benefit from mediastinal washout as his chest has been open and packed with Kerlix gauze almost 3 days.  We will plan on taking patient back to the OR on ECMO for mediastinal washout with general anesthesia including removal of a pericardial thrombus, replacement of chest tubes, and application of atrial pacing wires to help optimize hemodynamics.  Cardiopulmonary bypass standby will be present.. Objective: Vital signs in last 24 hours: Temp:  [97.5 F (36.4 C)-97.9 F (36.6 C)] 97.7 F (36.5 C) (08/23 1115) Pulse Rate:  [30-55] 53 (08/23 1115) Cardiac Rhythm: Sinus bradycardia (08/23 0800) Resp:  [0-20] 20 (08/23 1115) SpO2:  [98 %-100 %] 100 % (08/23 1115) Arterial Line BP: (76-123)/(55-82) 88/70 (08/23 1115) FiO2 (%):  [21 %] 21 % (08/23 1133) Weight:  [102.4 kg] 102.4 kg (08/23 0545)  Hemodynamic parameters for last 24 hours: CVP:  [9 mmHg-21 mmHg] 12 mmHg  Intake/Output from previous day: 08/22 0701 - 08/23 0700 In: 2994.4 [I.V.:1512.4; Blood:315; NG/GT:740; IV Piggyback:400] Out: 1125 [Urine:615; Drains:50; Chest Tube:460] Intake/Output this shift: Total I/O In: 1049 [I.V.:352.3; Blood:396.7; NG/GT:300] Out: 940 [Urine:920; Chest Tube:20]  Exam Patient sedated on ECMO and ventilator Generalized edema, weight up approximately 5 kg Sternal wound packed and not expanding Extremities warm Abdomen soft Scattered rhonchi Lab Results: Recent Labs    10/02/21 0120 10/02/21 0527 10/02/21 0528  10/02/21 0911 10/02/21 1210  WBC 12.5* 10.8*  --   --   --   HGB 6.5* 7.3*   < > 7.8* 8.2*  HCT 19.4* 22.2*   < > 23.0* 24.0*  PLT 97* 93*  --   --   --    < > = values in this interval not displayed.   BMET:  Recent Labs    10/01/21 1616 10/01/21 1623 10/02/21 0354 10/02/21 0528 10/02/21 0911 10/02/21 1210  NA 141   < > 140   < > 141 140  K 4.3   < > 4.2   < > 4.0 3.8  CL 108  --  109  --   --   --   CO2 28  --  26  --   --   --   GLUCOSE 183*  --  177*  --   --   --   BUN 43*  --  47*  --   --   --   CREATININE 2.62*  --  2.77*  --   --   --   CALCIUM 7.4*  --  7.4*  --   --   --    < > = values in this interval not displayed.    PT/INR:  Recent Labs    10/02/21 0354  LABPROT 19.4*  INR 1.7*   ABG    Component Value Date/Time   PHART 7.467 (H) 10/02/2021 1210   HCO3 30.9 (H) 10/02/2021 1210   TCO2 32 10/02/2021 1210   ACIDBASEDEF 5.0 (H) 09/29/2021 1443   O2SAT 100 10/02/2021 1210   CBG (last 3)  Recent Labs    10/02/21 0015 10/02/21 0359 10/02/21 0809  GLUCAP 148* 153*  165*    Assessment/Plan: S/P Procedure(s) (LRB): EXPLORATION POST OPERATIVE OPEN HEART WASHOUT (N/A) Plan return to the OR for mediastinal washout under general anesthesia tomorrow.  Patient not ready for decannulation of ECMO due to biventricular dysfunction noted on today's TEE.   LOS: 4 days    Dahlia Byes 10/02/2021

## 2021-10-02 NOTE — Progress Notes (Signed)
TEE performed at bedside by Bensimhon MD. ECMO flows decreased incrementally from 4.3 to 2 L during procedure. Patient did develop a fib briefly during this and was bolused with 150 mg amiodarone per orders. Otherwise VSS. Flows increased back to baseline after procedure complete.

## 2021-10-02 NOTE — CV Procedure (Signed)
    TRANSESOPHAGEAL ECHOCARDIOGRAM   NAME:  [name]   MRN: [id number] DOB:  Mar 01, 1960   ADMIT DATE: 09/28/2021  INDICATIONS:  Cardiogenic shock  PROCEDURE:   Emergent consent. The patient was already on the vent. Further sedated with versed and fentanyl. The transesophageal probe was inserted in the esophagus and stomach without difficulty and multiple views were obtained.    COMPLICATIONS:    There were no immediate complications.  FINDINGS:  LEFT VENTRICLE: EF = 25-30%.  RIGHT VENTRICLE: Moderately to severely HK. Mildly compressed  LEFT ATRIUM: Normal  LEFT ATRIAL APPENDAGE: No thrombus.   RIGHT ATRIUM: Normal + ECMO cannula  AORTIC VALVE:  Trileaflet. Mildly calcified. Mild AI  MITRAL VALVE:    Normal. Trivial MR  TRICUSPID VALVE: Normal. Trivial TR  PULMONIC VALVE: Grossly normal.  INTERATRIAL SEPTUM: No PFO or ASD.  PERICARDIUM: Moderate effusion + packing material  DESCENDING AORTA: Moderate plaque   Benay Spice 9:32 AM

## 2021-10-02 NOTE — Consult Note (Signed)
NAME:  [name], MRN:  [id number], DOB:  06/25/1960, LOS: 4 ADMISSION DATE:  09/28/2021, CONSULTATION DATE:  09/29/21 REFERRING MD:  Dr. Kipp Brood, CHIEF COMPLAINT:  Cardiac Arrest   History of Present Illness:  [name] is a 61 year old male with DMII, , CVA, atrial fibrillation on eliquis and multivessel CAD s/p 5 vessel CABG on 09/12/21 who presented 8/19 with shortness of breath via EMS. He was found to be bradycardic and hypoglycemic. He symptomatically improved with dextrose but continued to have bradycardia and soft blood pressures. ECHO obtained which showed LVEF 50%, grade II diastolic dysfunction and moderate pericardial effusion with possible tamponade physiology. CT Chest showed pericardial effusion and small bilateral pleural effusions. He was taken to the OR 8/19 for pericardial window.   Patient started having issues with tachy-brady arrhythmias on telemetry, decreased urine output and increased work of breathing over night. A 14 french pigtail catheter was placed in the left pleural space this morning with bloody drainage. At 10:35am patient became unresponsive with PEA arrest. He underwent multiple rounds of CPR with epinephrine pushes. ROSC was obtained and patient was intubated. Bedside US by cardiology showed large pericardial effusion with tamponade physiology. He lost pulse again and CPR was again performed. Please see code sheet for full details. Bedside sternotomy performed with pericardial drainage by CT surgery, immediate improvement in hemodynamics achieved with drainage of the pericardial space.   Pertinent  Medical History   Past Medical History:  Diagnosis Date   AKI (acute kidney injury) ([place])    pt unaware of this   Anginal pain ([place])    CAD (coronary artery disease)    a. 03/2015 NSTEMI: LHC with severe 3V CAD  (70% mid RCA, 95% OM1, 90% distal LCx, 90% OM3, 80% prox LAD and 90% ost D1) s/p DES to mLAD w/ small dissction Rx with DES, staged ost Ramus PCI/DES and  dLCx s/p PCI/DES    Chest pain 12/24/2020   Diabetes mellitus type 2 in obese [facility])    Diverticulosis    Dyspnea    Dyspnea on exertion 03/16/2015   Dyspnea on exertion   Family history of adverse reaction to anesthesia    patient father- pt states after anesthesia his father "developed dementia"   GERD (gastroesophageal reflux disease)    Hypercholesteremia    Hypertension associated with diabetes ([place]) 03/16/2015   hypertension   NSTEMI (non-ST elevated myocardial infarction) ([place]) 03/17/2015   Obesity    Stroke ([place]) 2022   pt states he had a "mini stroke" during cardiac catheterization   Tobacco abuse    Significant Hospital Events: Including procedures, antibiotic start and stop dates in addition to other pertinent events   8/19 admitted, s/p pericardial window 8/20 PEA cardiac arrest, left pigtail chest tube placement, PEA cardiac arrest due to tamponade, bedside sternotomy with pericardial drains placed. 8/21 return to OR for overnight bleeding.  8/22 minimal chest tube output.  Atrial fibrillation, controled with amiodarone.   Interim History / Subjective:   No further bleeding.  Will follow commands.   Objective   Blood pressure 98/77, pulse (!) 54, temperature 97.7 F (36.5 C), resp. rate 16, height 5\' 7"  (1.702 m), weight 102.4 kg, SpO2 100 %. CVP:  [9 mmHg-18 mmHg] 12 mmHg  Vent Mode: PRVC FiO2 (%):  [21 %] 21 % Set Rate:  [16 bmp] 16 bmp Vt Set:  [390 mL] 390 mL PEEP:  [10 cmH20] 10 cmH20 Plateau Pressure:  [21 cmH20-22 cmH20] 21 cmH20  Intake/Output Summary (Last 24 hours) at 10/02/2021 0739 Last data filed at 10/02/2021 0700 Gross per 24 hour  Intake 2994.36 ml  Output 1125 ml  Net 1869.36 ml    Filed Weights   09/29/21 0500 10/01/21 0500 10/02/21 0545  Weight: 102 kg 105.2 kg 102.4 kg   Examination: General: ill appearing male, intubated, sedated HENT: [place]/AT, moist mucous membranes, sclera anicteric Lungs: course breath sounds, left chest tube in  place, no air leak noted. Pigtail flushes but not draining.  Cardiovascular: rrr, pericardial drains in place, open sternotomy wound with surgical dressing in place Abdomen: soft, bowel sounds dminished Extremities: cool to touch, no edema Neuro: sedated, flickers eyes to voice.  GU: foley in place  POC echo: very limited acoustic windows, LV/RV both reduced but limited filling. + pericardial clot vs packing.   Ancillary tests personally review:  pCO2 52 with pH 7.37 Creatinine 2.77 INR 2.0 CXR shows clear lung fields  Assessment & Plan:  PEA Cardiac Arrest Cardiac Tamponade s/p pericardial drain placement Hemorraghic Pericarditis Obstructive Shock Coronary Artery Disease s/p CABG x 5 8/3 Atrial Fibrillation - CT surgery and Cardiology following - Obstructive shock relieved with pericardial drain placement,  - Continue levophed  for MAP >65 - Amiodarone for atrial fibrillation, continue at 30 as looses pulsatility in atrial fibrillation.  - Aspirin 325mg  BID and colchicine for pericarditis - Continue statin - Keep MAP 65-75, no more than 15 points of pulsatility to help prevent bleeding.   Acute Hypoxemic and Hypercapnic Respiratory Failure - continue mechanical ventilation, lung protective strategy - Transition to more normal ventilator settings. Reduced inspiratory time and increase respiratory rate to achieve normal minute ventilation.  - Wean FiO2 for SpO2 92% or greater - PFTs in 2022 were suggestive of restrictive defect - PRN duonebs ordered  Acute Kidney 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 Hyponatremia, mild - Monitor Cr and UOP - trend electrolytes - Diurese today.   Sepsis due to Pericarditis - treatment as above - Will broadly cover with vancomycin + cefepime due to bedside sternotomy procedure  Acute Blood Loss Anemia - Maintain hemoglobin 8-10g/dL  Hx of DMII - monitor blood sugars - SSI if needed  Best Practice (right click and "Reselect all SmartList Selections"  daily)   Diet/type: NPO - Cortrak today and initiate post-pyloric feeding.  DVT prophylaxis: not indicated GI prophylaxis: PPI Lines: Central line and yes and it is still needed Foley:  Yes, and it is still needed Code Status:  full code Last date of multidisciplinary goals of care discussion [patient's friends updated 8/20]  CRITICAL CARE Performed by: Kipp Brood   Total critical care time: 45 minutes  Critical care time was exclusive of separately billable procedures and treating other patients.  Critical care was necessary to treat or prevent imminent or life-threatening deterioration.  Critical care was time spent personally by me on the following activities: development of treatment plan with patient and/or surrogate as well as nursing, discussions with consultants, evaluation of patient's response to treatment, examination of patient, obtaining history from patient or surrogate, ordering and performing treatments and interventions, ordering and review of laboratory studies, ordering and review of radiographic studies, pulse oximetry, re-evaluation of patient's condition and participation in multidisciplinary rounds.  Kipp Brood, MD [facility] ICU Physician [place]  Pager: [phone] Mobile: [phone] After hours: [phone].

## 2021-10-02 NOTE — Progress Notes (Signed)
Nutrition Follow-up  DOCUMENTATION CODES:   Not applicable  INTERVENTION:   Discussed nutrition poc with Dr. Lynetta Mare.  MD okay with increasing TF, ok with continuing Cortrak gastric for now.   Tube Feeding via Cortrak:  Increased Pivot 1.5 to 30 ml/hr; titrate by 10 mL q 8 hours until goal rate of 60 ml/hr TF regimen at goal rate provides 2160 kcals, 135 g of protein and 1094 mL of free water  Continue Pro-Source TF20 60 mL QID until TF titrated to goal and pt tolerating TF  NUTRITION DIAGNOSIS:   Inadequate oral intake related to acute illness as evidenced by NPO status.  Being addressed via TF   GOAL:   Patient will meet greater than or equal to 90% of their needs  Progressing  MONITOR:   Vent status, Labs, Weight trends, TF tolerance  REASON FOR ASSESSMENT:   Ventilator    ASSESSMENT:   61 yo male admitted with acute on chronic CHF with recent CABG, symptomatic bradycardia and hypotension. PMH includes DM, CAD s/p recent CABG x 5, CKD 3, CVA  8/19 Pericardial window, evacuation of pericardial and pleural effusion 8/20 Intubated, Bedside mediastinal re-exploration, cardiac tamponade, mediastinal clot; PEA arrest due to tamponade, VA ECMO cannulation 8/21 Return to OR for bleeding, mediastinal re-exploration 8/23 TEE with EF 25-30%  Pt remains on vent support; VA ECMO with open chest Levophed down to 4 mcg/min  Noted plan for possible washout tomorrow. Biventricular function not adequate for decannulation at this time  Tolerating Pivot 1.5 at 20 ml/hr via Cortrak-gastric.  +BM today  Current wt 102.4 kg; down from 105 kg yesterday Only 625 mL documented UOP yesterday, receiving lasix today with 1180 mL already thus far  Chest tubes with 460 mL in 24 hours, 60 mL today so far  Labs: Creatinine 2.77, BUN 47, sodium 140 (wdl) Meds: ss novolog, levemir, lasix   Diet Order             Diet NPO time specified  Diet effective now                    EDUCATION NEEDS:   Not appropriate for education at this time  Skin:  Skin Assessment: Skin Integrity Issues: Skin Integrity Issues:: Incisions, Other (Comment) Incisions: open chest, VA ECMO Other: dehisced wound (previous surgical incision) to R medial pretibial  Last BM:  8/23  Height:   Ht Readings from Last 1 Encounters:  09/29/21 5\' 7"  (1.702 m)    Weight:   Wt Readings from Last 1 Encounters:  10/02/21 102.4 kg     BMI:  Body mass index is 35.36 kg/m.  Estimated Nutritional Needs:   Kcal:  2000-2200 kcals  Protein:  130-155 g  Fluid:  1.8 L  Kerman Passey MS, RDN, LDN, CNSC Registered Dietitian 3 Clinical Nutrition RD Pager and On-Call Pager Number Located in [place]

## 2021-10-02 NOTE — Progress Notes (Signed)
[facility] Progress Note Patient Name: BLONG BUSK DOB: [birth year] MRN: [id number]   Date of Service  10/02/2021  HPI/Events of Note  Hemoglobin 6.5 gm / dl, per bedside RN, no evidence of active bleeding warranting notification of CT surgery, BP stable with RN able to wean pressor.  eICU Interventions  Order entered to transfuse 2 units of PRBC.        Frederik Pear 10/02/2021, 2:38 AM

## 2021-10-02 NOTE — CV Procedure (Signed)
ECMO NOTE:   Indication: Cardiac arrest   Initial cannulation date: 09/29/21   ECMO type: [facility] ECMO   Dual lumen inflow/return cannula:   1) 23/25 FR multistage venous return cannula in RFV 2) Central aortic arterial return   ECMO events:   - 8/19 Pericardial window - 8/20 Cardiac arrest with tamponade -> Emergent bedside washout - 09/29/21 VA Cannulation - 09/30/21 Return to OR for mediastinal hemorrhage - 8/22 AF - 8/23 TEE: EF 25-30% Moderate to severe RV dysfunction  Daily data:   Flow 4.40 L RPM 3350 Sweep  3 L   Labs:   ABG    Component Value Date/Time   PHART 7.426 10/02/2021 0911   PCO2ART 44.3 10/02/2021 0911   PO2ART 454 (H) 10/02/2021 0911   HCO3 29.3 (H) 10/02/2021 0911   TCO2 31 10/02/2021 0911   ACIDBASEDEF 5.0 (H) 09/29/2021 1443   O2SAT 100 10/02/2021 0911      Plan:  - Bleeding improved but hgb still dropping slowly (? Component of hemodilution) - Continue ECMO support today - Diurese - Continue to hold AC. LDH ok    Discussed in multidisciplinary fashion with CCM, TCTS, Cardiology, ECMO coordinator/specialist, RT, PharmD and nursing staff all present.     Glori Bickers, MD  9:21 AM

## 2021-10-02 NOTE — Progress Notes (Signed)
Patient ID: [name], male   DOB: Sep 20, 1960, 61 y.o.   MRN: [id number]    Progress Note from the Palliative Medicine Team at [facility]   Patient Name: [name]        Date: 10/02/2021 DOB: 08-19-1960  Age: 61 y.o. MRN#: [id number] Attending Physician: Lajuana Matte, MD Primary Care Physician: [name], MD Admit Date: 09/28/2021   Medical records reviewed, discussed with treatment team, assessed patient at bedside  61 y.o. 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 Bradycardic and hypoglycemic.   ECHO obtained which showed LVEF 50%, grade II diastolic dysfunction and moderate pericardial effusion with possible tamponade physiology.    CT Chest showed pericardial effusion and small bilateral pleural effusions. He was taken to the OR 8/19 for pericardial window.    Significant Hospital Events 8/19 admitted, s/p pericardial window 8/20 PEA cardiac arrest, left pigtail chest tube placement, PEA cardiac arrest due to tamponade, bedside sternotomy with pericardial drains placed. 8/21 return to OR for overnight bleeding. ECMO 8/22 minimal chest tube output.  Atrial fibrillation, controled with amiodarone.  Initial palliative medicine consult completed on 09/30/2021.  Established decision makers at that time.  Colette Scrivner patient's,  legal wife is main contact and decision maker in the event patient does not have decision-making capacity for himself.  Today is day 4 of patient's hospitalization.  Patient remains intubated/sedated, on ECMO.   TEE today, left ventricle EF 25 to 30%.  Patient is critically ill, family face ongoing treatment option decisions,  and advanced directive decisions.    In support of family I spoke to Martinique Solazzo/legal spouse, I answered her questions to the best of my ability.  She tells me that a family member  who is a medical provider was able to speak to Dr. Lynetta Mare yesterday and that was very helpful.    Education offered on the seriousness of patient's current medical situation.  Family remain hopeful for ongoing treatment, stabilization and improvement.   I also spoke with [name] uncle, he appreciates the information that patient's wife has been found and contacted, as he  expressed that he will not be a Media planner for [employer].  Education offered today regarding  the importance of continued conversation with the  medical providers regarding overall plan of care and treatment options,  ensuring decisions are within the context of the patients values and GOCs.  This nurse practitioner informed  the family and bedside RN that I will be out of the hospital until Monday morning.  If the patient is still hospitalized I will follow-up at that time.  Call palliative medicine team phone # [phone] with questions or concerns in the interim   [name]  Palliative Medicine Team Team Phone # [phone] Pager [phone]

## 2021-10-02 NOTE — Progress Notes (Signed)
Notified Bensimhon MD of hemoglobin 7.7 on evening labs. Orders received to give 1 unit pRBCs and to give 40 mg lasix (previously ordered by Agarwala MD) after blood complete.

## 2021-10-03 ENCOUNTER — Other Ambulatory Visit: Payer: Self-pay

## 2021-10-03 ENCOUNTER — Inpatient Hospital Stay (HOSPITAL_COMMUNITY): Payer: Self-pay | Admitting: Certified Registered Nurse Anesthetist

## 2021-10-03 ENCOUNTER — Inpatient Hospital Stay (HOSPITAL_COMMUNITY): Payer: Self-pay

## 2021-10-03 ENCOUNTER — Encounter (HOSPITAL_COMMUNITY)
Admission: EM | Disposition: A | Discharge status: Skilled Nursing Facility | Payer: Self-pay | Source: Home / Self Care | Attending: Surgery

## 2021-10-03 ENCOUNTER — Ambulatory Visit: Payer: Self-pay

## 2021-10-03 DIAGNOSIS — R579 Shock, unspecified: Secondary | ICD-10-CM

## 2021-10-03 DIAGNOSIS — I2510 Atherosclerotic heart disease of native coronary artery without angina pectoris: Secondary | ICD-10-CM

## 2021-10-03 DIAGNOSIS — I97638 Postprocedural hematoma of a circulatory system organ or structure following other circulatory system procedure: Secondary | ICD-10-CM

## 2021-10-03 DIAGNOSIS — Z87891 Personal history of nicotine dependence: Secondary | ICD-10-CM

## 2021-10-03 DIAGNOSIS — I10 Essential (primary) hypertension: Secondary | ICD-10-CM

## 2021-10-03 HISTORY — PX: TEE WITHOUT CARDIOVERSION: SHX5443

## 2021-10-03 HISTORY — PX: APPLICATION OF WOUND VAC: SHX5189

## 2021-10-03 HISTORY — PX: MEDIASTINAL EXPLORATION: SHX5065

## 2021-10-03 LAB — TYPE AND SCREEN
ABO/RH(D): O POS
Antibody Screen: NEGATIVE
Unit division: 0
Unit division: 0
Unit division: 0
Unit division: 0
Unit division: 0
Unit division: 0
Unit division: 0
Unit division: 0
Unit division: 0
Unit division: 0
Unit division: 0
Unit division: 0
Unit division: 0
Unit division: 0
Unit division: 0
Unit division: 0
Unit division: 0
Unit division: 0
Unit division: 0
Unit division: 0
Unit division: 0
Unit division: 0
Unit division: 0
Unit division: 0
Unit division: 0
Unit division: 0
Unit division: 0
Unit division: 0
Unit division: 0

## 2021-10-03 LAB — CBC
HCT: 25.6 % — ABNORMAL LOW (ref 39.0–52.0)
HCT: 22.6 % — ABNORMAL LOW (ref 39.0–52.0)
HCT: 20.9 % — ABNORMAL LOW (ref 39.0–52.0)
HCT: 18.6 % — ABNORMAL LOW (ref 39.0–52.0)
Hemoglobin: 8.3 g/dL — ABNORMAL LOW (ref 13.0–17.0)
Hemoglobin: 7.6 g/dL — ABNORMAL LOW (ref 13.0–17.0)
Hemoglobin: 7.3 g/dL — ABNORMAL LOW (ref 13.0–17.0)
Hemoglobin: 6.6 g/dL — CL (ref 13.0–17.0)
MCH: 28.2 pg (ref 26.0–34.0)
MCH: 29.5 pg (ref 26.0–34.0)
MCH: 31.1 pg (ref 26.0–34.0)
MCH: 30.6 pg (ref 26.0–34.0)
MCHC: 32.4 g/dL (ref 30.0–36.0)
MCHC: 33.6 g/dL (ref 30.0–36.0)
MCHC: 34.9 g/dL (ref 30.0–36.0)
MCHC: 35.5 g/dL (ref 30.0–36.0)
MCV: 87.1 fL (ref 80.0–100.0)
MCV: 87.6 fL (ref 80.0–100.0)
MCV: 88.9 fL (ref 80.0–100.0)
MCV: 86.1 fL (ref 80.0–100.0)
Platelets: 83 10*3/uL — ABNORMAL LOW (ref 150–400)
Platelets: 74 10*3/uL — ABNORMAL LOW (ref 150–400)
Platelets: 68 10*3/uL — ABNORMAL LOW (ref 150–400)
Platelets: 83 10*3/uL — ABNORMAL LOW (ref 150–400)
RBC: 2.94 MIL/uL — ABNORMAL LOW (ref 4.22–5.81)
RBC: 2.58 MIL/uL — ABNORMAL LOW (ref 4.22–5.81)
RBC: 2.35 MIL/uL — ABNORMAL LOW (ref 4.22–5.81)
RBC: 2.16 MIL/uL — ABNORMAL LOW (ref 4.22–5.81)
RDW: 17.1 % — ABNORMAL HIGH (ref 11.5–15.5)
RDW: 15.5 % (ref 11.5–15.5)
RDW: 14.8 % (ref 11.5–15.5)
RDW: 14.8 % (ref 11.5–15.5)
WBC: 9.6 10*3/uL (ref 4.0–10.5)
WBC: 11 10*3/uL — ABNORMAL HIGH (ref 4.0–10.5)
WBC: 10.7 10*3/uL — ABNORMAL HIGH (ref 4.0–10.5)
WBC: 10.6 10*3/uL — ABNORMAL HIGH (ref 4.0–10.5)
nRBC: 0.5 % — ABNORMAL HIGH (ref 0.0–0.2)
nRBC: 0.6 % — ABNORMAL HIGH (ref 0.0–0.2)
nRBC: 0.7 % — ABNORMAL HIGH (ref 0.0–0.2)
nRBC: 0.9 % — ABNORMAL HIGH (ref 0.0–0.2)

## 2021-10-03 LAB — POCT I-STAT 7, (LYTES, BLD GAS, ICA,H+H)
Acid-Base Excess: 5 mmol/L — ABNORMAL HIGH (ref 0.0–2.0)
Acid-Base Excess: 6 mmol/L — ABNORMAL HIGH (ref 0.0–2.0)
Acid-Base Excess: 6 mmol/L — ABNORMAL HIGH (ref 0.0–2.0)
Acid-Base Excess: 6 mmol/L — ABNORMAL HIGH (ref 0.0–2.0)
Acid-Base Excess: 6 mmol/L — ABNORMAL HIGH (ref 0.0–2.0)
Acid-Base Excess: 6 mmol/L — ABNORMAL HIGH (ref 0.0–2.0)
Acid-Base Excess: 8 mmol/L — ABNORMAL HIGH (ref 0.0–2.0)
Bicarbonate: 31.1 mmol/L — ABNORMAL HIGH (ref 20.0–28.0)
Bicarbonate: 30.2 mmol/L — ABNORMAL HIGH (ref 20.0–28.0)
Bicarbonate: 28.7 mmol/L — ABNORMAL HIGH (ref 20.0–28.0)
Bicarbonate: 30.8 mmol/L — ABNORMAL HIGH (ref 20.0–28.0)
Bicarbonate: 30.9 mmol/L — ABNORMAL HIGH (ref 20.0–28.0)
Bicarbonate: 31.7 mmol/L — ABNORMAL HIGH (ref 20.0–28.0)
Bicarbonate: 33.1 mmol/L — ABNORMAL HIGH (ref 20.0–28.0)
Calcium, Ion: 1.11 mmol/L — ABNORMAL LOW (ref 1.15–1.40)
Calcium, Ion: 1.11 mmol/L — ABNORMAL LOW (ref 1.15–1.40)
Calcium, Ion: 0.97 mmol/L — ABNORMAL LOW (ref 1.15–1.40)
Calcium, Ion: 1.09 mmol/L — ABNORMAL LOW (ref 1.15–1.40)
Calcium, Ion: 1.09 mmol/L — ABNORMAL LOW (ref 1.15–1.40)
Calcium, Ion: 1.12 mmol/L — ABNORMAL LOW (ref 1.15–1.40)
Calcium, Ion: 1.1 mmol/L — ABNORMAL LOW (ref 1.15–1.40)
HCT: 25 % — ABNORMAL LOW (ref 39.0–52.0)
HCT: 23 % — ABNORMAL LOW (ref 39.0–52.0)
HCT: 21 % — ABNORMAL LOW (ref 39.0–52.0)
HCT: 23 % — ABNORMAL LOW (ref 39.0–52.0)
HCT: 21 % — ABNORMAL LOW (ref 39.0–52.0)
HCT: 17 % — ABNORMAL LOW (ref 39.0–52.0)
HCT: 16 % — ABNORMAL LOW (ref 39.0–52.0)
Hemoglobin: 8.5 g/dL — ABNORMAL LOW (ref 13.0–17.0)
Hemoglobin: 7.8 g/dL — ABNORMAL LOW (ref 13.0–17.0)
Hemoglobin: 7.1 g/dL — ABNORMAL LOW (ref 13.0–17.0)
Hemoglobin: 7.8 g/dL — ABNORMAL LOW (ref 13.0–17.0)
Hemoglobin: 7.1 g/dL — ABNORMAL LOW (ref 13.0–17.0)
Hemoglobin: 5.8 g/dL — CL (ref 13.0–17.0)
Hemoglobin: 5.4 g/dL — CL (ref 13.0–17.0)
O2 Saturation: 100 %
O2 Saturation: 100 %
O2 Saturation: 100 %
O2 Saturation: 100 %
O2 Saturation: 100 %
O2 Saturation: 100 %
O2 Saturation: 100 %
Patient temperature: 36.5
Patient temperature: 36.1
Patient temperature: 36.6
Patient temperature: 36.6
Patient temperature: 36.4
Potassium: 4.2 mmol/L (ref 3.5–5.1)
Potassium: 4.2 mmol/L (ref 3.5–5.1)
Potassium: 3.6 mmol/L (ref 3.5–5.1)
Potassium: 3.8 mmol/L (ref 3.5–5.1)
Potassium: 3.9 mmol/L (ref 3.5–5.1)
Potassium: 4.2 mmol/L (ref 3.5–5.1)
Potassium: 4.1 mmol/L (ref 3.5–5.1)
Sodium: 142 mmol/L (ref 135–145)
Sodium: 143 mmol/L (ref 135–145)
Sodium: 144 mmol/L (ref 135–145)
Sodium: 144 mmol/L (ref 135–145)
Sodium: 142 mmol/L (ref 135–145)
Sodium: 144 mmol/L (ref 135–145)
Sodium: 142 mmol/L (ref 135–145)
TCO2: 33 mmol/L — ABNORMAL HIGH (ref 22–32)
TCO2: 31 mmol/L (ref 22–32)
TCO2: 30 mmol/L (ref 22–32)
TCO2: 32 mmol/L (ref 22–32)
TCO2: 32 mmol/L (ref 22–32)
TCO2: 33 mmol/L — ABNORMAL HIGH (ref 22–32)
TCO2: 35 mmol/L — ABNORMAL HIGH (ref 22–32)
pCO2 arterial: 52.2 mmHg — ABNORMAL HIGH (ref 32–48)
pCO2 arterial: 41.5 mmHg (ref 32–48)
pCO2 arterial: 30.9 mmHg — ABNORMAL LOW (ref 32–48)
pCO2 arterial: 44 mmHg (ref 32–48)
pCO2 arterial: 48.1 mmHg — ABNORMAL HIGH (ref 32–48)
pCO2 arterial: 53.3 mmHg — ABNORMAL HIGH (ref 32–48)
pCO2 arterial: 50.9 mmHg — ABNORMAL HIGH (ref 32–48)
pH, Arterial: 7.381 (ref 7.35–7.45)
pH, Arterial: 7.47 — ABNORMAL HIGH (ref 7.35–7.45)
pH, Arterial: 7.577 — ABNORMAL HIGH (ref 7.35–7.45)
pH, Arterial: 7.449 (ref 7.35–7.45)
pH, Arterial: 7.414 (ref 7.35–7.45)
pH, Arterial: 7.381 (ref 7.35–7.45)
pH, Arterial: 7.418 (ref 7.35–7.45)
pO2, Arterial: 439 mmHg — ABNORMAL HIGH (ref 83–108)
pO2, Arterial: 544 mmHg — ABNORMAL HIGH (ref 83–108)
pO2, Arterial: 439 mmHg — ABNORMAL HIGH (ref 83–108)
pO2, Arterial: 473 mmHg — ABNORMAL HIGH (ref 83–108)
pO2, Arterial: 457 mmHg — ABNORMAL HIGH (ref 83–108)
pO2, Arterial: 470 mmHg — ABNORMAL HIGH (ref 83–108)
pO2, Arterial: 445 mmHg — ABNORMAL HIGH (ref 83–108)

## 2021-10-03 LAB — BPAM RBC
Blood Product Expiration Date: 202309212359
Blood Product Expiration Date: 202309212359
Blood Product Expiration Date: 202309222359
Blood Product Expiration Date: 202309222359
Blood Product Expiration Date: 202309232359
Blood Product Expiration Date: 202309252359
Blood Product Expiration Date: 202309252359
Blood Product Expiration Date: 202309252359
Blood Product Expiration Date: 202309252359
Blood Product Expiration Date: 202309252359
Blood Product Expiration Date: 202309252359
Blood Product Expiration Date: 202309252359
Blood Product Expiration Date: 202309252359
Blood Product Expiration Date: 202309252359
Blood Product Expiration Date: 202309252359
Blood Product Expiration Date: 202309252359
Blood Product Expiration Date: 202309252359
Blood Product Expiration Date: 202309252359
Blood Product Expiration Date: 202309252359
Blood Product Expiration Date: 202309252359
Blood Product Expiration Date: 202309252359
Blood Product Expiration Date: 202309252359
Blood Product Expiration Date: 202309252359
Blood Product Expiration Date: 202309262359
Blood Product Expiration Date: 202309262359
Blood Product Expiration Date: 202309262359
Blood Product Expiration Date: 202309262359
Blood Product Expiration Date: 202309262359
Blood Product Expiration Date: 202309272359
ISSUE DATE / TIME: 202308201108
ISSUE DATE / TIME: 202308201108
ISSUE DATE / TIME: 202308201350
ISSUE DATE / TIME: 202308201350
ISSUE DATE / TIME: 202308201350
ISSUE DATE / TIME: 202308201350
ISSUE DATE / TIME: 202308201825
ISSUE DATE / TIME: 202308201825
ISSUE DATE / TIME: 202308201936
ISSUE DATE / TIME: 202308201936
ISSUE DATE / TIME: 202308201936
ISSUE DATE / TIME: 202308201936
ISSUE DATE / TIME: 202308210300
ISSUE DATE / TIME: 202308210300
ISSUE DATE / TIME: 202308210300
ISSUE DATE / TIME: 202308210300
ISSUE DATE / TIME: 202308210445
ISSUE DATE / TIME: 202308210445
ISSUE DATE / TIME: 202308211136
ISSUE DATE / TIME: 202308211152
ISSUE DATE / TIME: 202308211152
ISSUE DATE / TIME: 202308211619
ISSUE DATE / TIME: 202308211619
ISSUE DATE / TIME: 202308220117
ISSUE DATE / TIME: 202308230250
ISSUE DATE / TIME: 202308230817
ISSUE DATE / TIME: 202308240804
ISSUE DATE / TIME: 202308240804
ISSUE DATE / TIME: 202308240804
Unit Type and Rh: 5100
Unit Type and Rh: 5100
Unit Type and Rh: 5100
Unit Type and Rh: 5100
Unit Type and Rh: 5100
Unit Type and Rh: 5100
Unit Type and Rh: 5100
Unit Type and Rh: 5100
Unit Type and Rh: 5100
Unit Type and Rh: 5100
Unit Type and Rh: 5100
Unit Type and Rh: 5100
Unit Type and Rh: 5100
Unit Type and Rh: 5100
Unit Type and Rh: 5100
Unit Type and Rh: 5100
Unit Type and Rh: 5100
Unit Type and Rh: 5100
Unit Type and Rh: 5100
Unit Type and Rh: 5100
Unit Type and Rh: 5100
Unit Type and Rh: 5100
Unit Type and Rh: 5100
Unit Type and Rh: 5100
Unit Type and Rh: 5100
Unit Type and Rh: 5100
Unit Type and Rh: 5100
Unit Type and Rh: 5100
Unit Type and Rh: 5100

## 2021-10-03 LAB — BPAM CRYOPRECIPITATE
Blood Product Expiration Date: 202308241934
Blood Product Expiration Date: 202308241934
ISSUE DATE / TIME: 202308241352
ISSUE DATE / TIME: 202308241352
Unit Type and Rh: 5100
Unit Type and Rh: 5100

## 2021-10-03 LAB — BASIC METABOLIC PANEL
Anion gap: 4 — ABNORMAL LOW (ref 5–15)
Anion gap: 3 — ABNORMAL LOW (ref 5–15)
Anion gap: 4 — ABNORMAL LOW (ref 5–15)
BUN: 53 mg/dL — ABNORMAL HIGH (ref 8–23)
BUN: 52 mg/dL — ABNORMAL HIGH (ref 8–23)
BUN: 49 mg/dL — ABNORMAL HIGH (ref 8–23)
CO2: 26 mmol/L (ref 22–32)
CO2: 28 mmol/L (ref 22–32)
CO2: 29 mmol/L (ref 22–32)
Calcium: 7.3 mg/dL — ABNORMAL LOW (ref 8.9–10.3)
Calcium: 6.9 mg/dL — ABNORMAL LOW (ref 8.9–10.3)
Calcium: 7.4 mg/dL — ABNORMAL LOW (ref 8.9–10.3)
Chloride: 111 mmol/L (ref 98–111)
Chloride: 111 mmol/L (ref 98–111)
Chloride: 111 mmol/L (ref 98–111)
Creatinine, Ser: 2.78 mg/dL — ABNORMAL HIGH (ref 0.61–1.24)
Creatinine, Ser: 2.5 mg/dL — ABNORMAL HIGH (ref 0.61–1.24)
Creatinine, Ser: 2.44 mg/dL — ABNORMAL HIGH (ref 0.61–1.24)
GFR, Estimated: 25 mL/min — ABNORMAL LOW (ref >60)
GFR, Estimated: 29 mL/min — ABNORMAL LOW (ref >60)
GFR, Estimated: 29 mL/min — ABNORMAL LOW (ref >60)
Glucose, Bld: 149 mg/dL — ABNORMAL HIGH (ref 70–99)
Glucose, Bld: 171 mg/dL — ABNORMAL HIGH (ref 70–99)
Glucose, Bld: 161 mg/dL — ABNORMAL HIGH (ref 70–99)
Potassium: 4.2 mmol/L (ref 3.5–5.1)
Potassium: 4 mmol/L (ref 3.5–5.1)
Potassium: 4.2 mmol/L (ref 3.5–5.1)
Sodium: 141 mmol/L (ref 135–145)
Sodium: 142 mmol/L (ref 135–145)
Sodium: 144 mmol/L (ref 135–145)

## 2021-10-03 LAB — PREPARE RBC (CROSSMATCH)

## 2021-10-03 LAB — POCT I-STAT, CHEM 8
BUN: 49 mg/dL — ABNORMAL HIGH (ref 8–23)
BUN: 41 mg/dL — ABNORMAL HIGH (ref 8–23)
Calcium, Ion: 1.12 mmol/L — ABNORMAL LOW (ref 1.15–1.40)
Calcium, Ion: 0.99 mmol/L — ABNORMAL LOW (ref 1.15–1.40)
Chloride: 107 mmol/L (ref 98–111)
Chloride: 107 mmol/L (ref 98–111)
Creatinine, Ser: 2.7 mg/dL — ABNORMAL HIGH (ref 0.61–1.24)
Creatinine, Ser: 2.3 mg/dL — ABNORMAL HIGH (ref 0.61–1.24)
Glucose, Bld: 140 mg/dL — ABNORMAL HIGH (ref 70–99)
Glucose, Bld: 132 mg/dL — ABNORMAL HIGH (ref 70–99)
HCT: 23 % — ABNORMAL LOW (ref 39.0–52.0)
HCT: 24 % — ABNORMAL LOW (ref 39.0–52.0)
Hemoglobin: 7.8 g/dL — ABNORMAL LOW (ref 13.0–17.0)
Hemoglobin: 8.2 g/dL — ABNORMAL LOW (ref 13.0–17.0)
Potassium: 4.2 mmol/L (ref 3.5–5.1)
Potassium: 3.6 mmol/L (ref 3.5–5.1)
Sodium: 144 mmol/L (ref 135–145)
Sodium: 145 mmol/L (ref 135–145)
TCO2: 28 mmol/L (ref 22–32)
TCO2: 26 mmol/L (ref 22–32)

## 2021-10-03 LAB — HEPATIC FUNCTION PANEL
ALT: 18 U/L (ref 0–44)
AST: 46 U/L — ABNORMAL HIGH (ref 15–41)
Albumin: 2 g/dL — ABNORMAL LOW (ref 3.5–5.0)
Alkaline Phosphatase: 116 U/L (ref 38–126)
Bilirubin, Direct: 0.5 mg/dL — ABNORMAL HIGH (ref 0.0–0.2)
Indirect Bilirubin: 0.6 mg/dL (ref 0.3–0.9)
Total Bilirubin: 1.1 mg/dL (ref 0.3–1.2)
Total Protein: 4.1 g/dL — ABNORMAL LOW (ref 6.5–8.1)

## 2021-10-03 LAB — FIBRINOGEN: Fibrinogen: 653 mg/dL — ABNORMAL HIGH (ref 210–475)

## 2021-10-03 LAB — ECHO INTRAOPERATIVE TEE
Height: 67 in
Weight: 3679.04 oz

## 2021-10-03 LAB — GLOBAL TEG PANEL
CFF Max Amplitude: 21.4 mm (ref 15–32)
CK with Heparinase (R): 7.4 min (ref 4.3–8.3)
Citrated Functional Fibrinogen: 390.5 mg/dL (ref 278–581)
Citrated Kaolin (K): 1.3 min (ref 0.8–2.1)
Citrated Kaolin (MA): 63.1 mm (ref 52–69)
Citrated Kaolin (R): 9.2 min — ABNORMAL HIGH (ref 4.6–9.1)
Citrated Kaolin Angle: 73.4 deg (ref 63–78)
Citrated Rapid TEG (MA): 59 mm (ref 52–70)

## 2021-10-03 LAB — PREPARE CRYOPRECIPITATE
Unit division: 0
Unit division: 0

## 2021-10-03 LAB — GLUCOSE, CAPILLARY
Glucose-Capillary: 125 mg/dL — ABNORMAL HIGH (ref 70–99)
Glucose-Capillary: 130 mg/dL — ABNORMAL HIGH (ref 70–99)
Glucose-Capillary: 113 mg/dL — ABNORMAL HIGH (ref 70–99)
Glucose-Capillary: 154 mg/dL — ABNORMAL HIGH (ref 70–99)
Glucose-Capillary: 130 mg/dL — ABNORMAL HIGH (ref 70–99)
Glucose-Capillary: 133 mg/dL — ABNORMAL HIGH (ref 70–99)

## 2021-10-03 LAB — APTT: aPTT: 43 seconds — ABNORMAL HIGH (ref 24–36)

## 2021-10-03 LAB — PROTIME-INR
INR: 1.5 — ABNORMAL HIGH (ref 0.8–1.2)
INR: 0.9 (ref 0.8–1.2)
Prothrombin Time: 18.4 seconds — ABNORMAL HIGH (ref 11.4–15.2)
Prothrombin Time: 12.1 seconds (ref 11.4–15.2)

## 2021-10-03 LAB — DIC (DISSEMINATED INTRAVASCULAR COAGULATION)PANEL
D-Dimer, Quant: 3.17 ug/mL-FEU — ABNORMAL HIGH (ref 0.00–0.50)
Fibrinogen: 502 mg/dL — ABNORMAL HIGH (ref 210–475)
INR: 1.5 — ABNORMAL HIGH (ref 0.8–1.2)
Platelets: 83 10*3/uL — ABNORMAL LOW (ref 150–400)
Prothrombin Time: 18.3 seconds — ABNORMAL HIGH (ref 11.4–15.2)
Smear Review: NONE SEEN
aPTT: 40 seconds — ABNORMAL HIGH (ref 24–36)

## 2021-10-03 LAB — MAGNESIUM: Magnesium: 2.6 mg/dL — ABNORMAL HIGH (ref 1.7–2.4)

## 2021-10-03 LAB — LACTATE DEHYDROGENASE: LDH: 255 U/L — ABNORMAL HIGH (ref 98–192)

## 2021-10-03 LAB — PHOSPHORUS: Phosphorus: 3.6 mg/dL (ref 2.5–4.6)

## 2021-10-03 SURGERY — EXPLORATION, MEDIASTINUM
Anesthesia: General | Site: Chest

## 2021-10-03 MED ORDER — SODIUM CHLORIDE 0.9 % IV SOLN
20.0000 ug | Freq: Once | INTRAVENOUS | Status: AC
  Administered 2021-10-03: 20 ug via INTRAVENOUS
  Filled 2021-10-03: qty 5

## 2021-10-03 MED ORDER — ATROPINE SULFATE 0.4 MG/ML IV SOLN
INTRAVENOUS | Status: AC
  Filled 2021-10-03: qty 1

## 2021-10-03 MED ORDER — ROCURONIUM BROMIDE 10 MG/ML (PF) SYRINGE
PREFILLED_SYRINGE | INTRAVENOUS | Status: DC | PRN
  Administered 2021-10-03: 70 mg via INTRAVENOUS
  Administered 2021-10-03: 30 mg via INTRAVENOUS
  Administered 2021-10-03: 20 mg via INTRAVENOUS

## 2021-10-03 MED ORDER — VANCOMYCIN HCL 1000 MG IV SOLR
INTRAVENOUS | Status: AC
  Filled 2021-10-03: qty 20

## 2021-10-03 MED ORDER — SODIUM CHLORIDE 0.9 % IV SOLN: INTRAVENOUS | Status: DC | PRN

## 2021-10-03 MED ORDER — METOCLOPRAMIDE HCL 5 MG/ML IJ SOLN
10.0000 mg | Freq: Four times a day (QID) | INTRAMUSCULAR | Status: DC
  Administered 2021-10-03 – 2021-10-17 (×53): 10 mg via INTRAVENOUS
  Filled 2021-10-03 (×53): qty 2

## 2021-10-03 MED ORDER — DOBUTAMINE IN D5W 4-5 MG/ML-% IV SOLN: 2.5000 ug/kg/min | INTRAVENOUS | Status: DC

## 2021-10-03 MED ORDER — EPINEPHRINE HCL 5 MG/250ML IV SOLN IN NS
INTRAVENOUS | Status: DC | PRN
  Administered 2021-10-03: 2 ug/min via INTRAVENOUS

## 2021-10-03 MED ORDER — SODIUM CHLORIDE 0.9% IV SOLUTION: Freq: Once | INTRAVENOUS | Status: AC

## 2021-10-03 MED ORDER — COAGULATION FACTOR VIIA RECOMB 1 MG IV SOLR
90.0000 ug/kg | Freq: Once | INTRAVENOUS | Status: AC
  Administered 2021-10-03: 7000 ug via INTRAVENOUS
  Filled 2021-10-03: qty 5

## 2021-10-03 MED ORDER — CALCIUM CHLORIDE 10 % IV SOLN
INTRAVENOUS | Status: AC
  Filled 2021-10-03: qty 10

## 2021-10-03 MED ORDER — EPINEPHRINE 1 MG/10ML IJ SOSY
PREFILLED_SYRINGE | INTRAMUSCULAR | Status: AC
  Filled 2021-10-03: qty 20

## 2021-10-03 MED ORDER — SODIUM CHLORIDE 0.9% IV SOLUTION: Freq: Once | INTRAVENOUS | Status: DC

## 2021-10-03 MED ORDER — HEPARIN SODIUM (PORCINE) 1000 UNIT/ML IJ SOLN
INTRAMUSCULAR | Status: AC
  Filled 2021-10-03: qty 1

## 2021-10-03 MED ORDER — FENTANYL CITRATE (PF) 250 MCG/5ML IJ SOLN
INTRAMUSCULAR | Status: AC
  Filled 2021-10-03: qty 5

## 2021-10-03 MED ORDER — FUROSEMIDE 10 MG/ML IJ SOLN: 60.0000 mg | Freq: Two times a day (BID) | INTRAMUSCULAR | Status: DC

## 2021-10-03 MED ORDER — ONDANSETRON HCL 4 MG/2ML IJ SOLN
INTRAMUSCULAR | Status: AC
  Filled 2021-10-03: qty 2

## 2021-10-03 MED ORDER — ATROPINE SULFATE 1 MG/10ML IJ SOSY
PREFILLED_SYRINGE | INTRAMUSCULAR | Status: AC
  Filled 2021-10-03: qty 20

## 2021-10-03 MED ORDER — HEMOSTATIC AGENTS (NO CHARGE) OPTIME
TOPICAL | Status: DC | PRN
  Administered 2021-10-03 (×3): 1 via TOPICAL

## 2021-10-03 MED ORDER — NITROGLYCERIN IN D5W 200-5 MCG/ML-% IV SOLN
INTRAVENOUS | Status: DC | PRN
  Administered 2021-10-03: 50 ug/min via INTRAVENOUS

## 2021-10-03 MED ORDER — DOBUTAMINE IN D5W 4-5 MG/ML-% IV SOLN
2.5000 ug/kg/min | INTRAVENOUS | Status: AC
  Administered 2021-10-03: 4 ug/kg/min via INTRAVENOUS
  Filled 2021-10-03: qty 250

## 2021-10-03 MED ORDER — VANCOMYCIN HCL 500 MG IV SOLR
INTRAVENOUS | Status: AC
  Filled 2021-10-03: qty 10

## 2021-10-03 MED ORDER — NITROGLYCERIN 0.2 MG/ML ON CALL CATH LAB
INTRAVENOUS | Status: DC | PRN
  Administered 2021-10-03 (×2): 40 ug via INTRAVENOUS

## 2021-10-03 MED ORDER — PROPOFOL 10 MG/ML IV BOLUS
INTRAVENOUS | Status: AC
  Filled 2021-10-03: qty 20

## 2021-10-03 MED ORDER — SODIUM BICARBONATE 8.4 % IV SOLN
INTRAVENOUS | Status: AC
  Filled 2021-10-03: qty 100

## 2021-10-03 MED ORDER — FENTANYL CITRATE (PF) 250 MCG/5ML IJ SOLN
INTRAMUSCULAR | Status: DC | PRN
  Administered 2021-10-03: 50 ug via INTRAVENOUS
  Administered 2021-10-03: 100 ug via INTRAVENOUS
  Administered 2021-10-03 (×2): 50 ug via INTRAVENOUS

## 2021-10-03 MED ORDER — VANCOMYCIN HCL 500 MG IV SOLR
INTRAVENOUS | Status: DC | PRN
  Administered 2021-10-03: 500 mg
  Administered 2021-10-03: 1000 mg

## 2021-10-03 MED ORDER — SODIUM CHLORIDE 0.9 % IR SOLN
Status: DC | PRN
  Administered 2021-10-03: 2000 mL
  Administered 2021-10-03: 1000 mL

## 2021-10-03 MED ORDER — VANCOMYCIN HCL 750 MG/150ML IV SOLN
750.0000 mg | INTRAVENOUS | Status: DC
Start: 2021-10-04 — End: 2021-10-06
  Administered 2021-10-04 – 2021-10-05 (×2): 750 mg via INTRAVENOUS
  Filled 2021-10-03 (×3): qty 150

## 2021-10-03 MED ORDER — ROCURONIUM BROMIDE 10 MG/ML (PF) SYRINGE
PREFILLED_SYRINGE | INTRAVENOUS | Status: AC
Start: 2021-10-03 — End: ?
  Filled 2021-10-03: qty 30

## 2021-10-03 SURGICAL SUPPLY — 127 items
ADAPTER CARDIO PERF ANTE/RETRO (ADAPTER) IMPLANT
ADPR PRFSN 84XANTGRD RTRGD (ADAPTER)
APL SKNCLS STERI-STRIP NONHPOA (GAUZE/BANDAGES/DRESSINGS) ×6
APPLIER CLIP 9.375 MED OPEN (MISCELLANEOUS)
APPLIER CLIP 9.375 SM OPEN (CLIP)
APR CLP MED 9.3 20 MLT OPN (MISCELLANEOUS)
APR CLP SM 9.3 20 MLT OPN (CLIP)
BAG DECANTER FOR FLEXI CONT (MISCELLANEOUS) ×2 IMPLANT
BANDAGE ESMARK 6X9 LF (GAUZE/BANDAGES/DRESSINGS) IMPLANT
BENZOIN TINCTURE AMPULE (MISCELLANEOUS) IMPLANT
BENZOIN TINCTURE PRP APPL 2/3 (GAUZE/BANDAGES/DRESSINGS) IMPLANT
BLADE CLIPPER SURG (BLADE) ×2 IMPLANT
BLADE SURG 11 STRL SS (BLADE) IMPLANT
BLADE SURG 12 STRL SS (BLADE) IMPLANT
BNDG CMPR 9X6 STRL LF SNTH (GAUZE/BANDAGES/DRESSINGS) ×2
BNDG ESMARK 6X9 LF (GAUZE/BANDAGES/DRESSINGS) ×2
CABLE PACING FASLOC BLUE (MISCELLANEOUS) IMPLANT
CANISTER SUCT 3000ML PPV (MISCELLANEOUS) ×2 IMPLANT
CANISTER WOUNDNEG PRESSURE 500 (CANNISTER) IMPLANT
CANNULA GUNDRY RCSP 15FR (MISCELLANEOUS) IMPLANT
CATH THORACIC 28FR (CATHETERS) IMPLANT
CATH THORACIC 28FR RT ANG (CATHETERS) IMPLANT
CATH THORACIC 36FR RT ANG (CATHETERS) IMPLANT
CHG Tegaderm Gel Pad IMPLANT
CLIP APPLIE 9.375 MED OPEN (MISCELLANEOUS) IMPLANT
CLIP APPLIE 9.375 SM OPEN (CLIP) IMPLANT
CLIP FOGARTY SPRING 6M (CLIP) IMPLANT
CLIP TI LARGE 6 (CLIP) IMPLANT
CLIP TI MEDIUM 24 (CLIP) IMPLANT
CLIP TI WIDE RED SMALL 24 (CLIP) IMPLANT
CLIP VESOCCLUDE MED 24/CT (CLIP) IMPLANT
CLIP VESOCCLUDE SM WIDE 24/CT (CLIP) IMPLANT
CONN ST 1/4X3/8  BEN (MISCELLANEOUS) ×6
CONN ST 1/4X3/8 BEN (MISCELLANEOUS) IMPLANT
CONN Y 3/8X3/8X3/8  BEN (MISCELLANEOUS) ×4
CONN Y 3/8X3/8X3/8 BEN (MISCELLANEOUS) IMPLANT
CONTAINER PROTECT SURGISLUSH (MISCELLANEOUS) ×4 IMPLANT
COUNTER NEEDLE 20 DBL MAG RED (NEEDLE) IMPLANT
COVER SURGICAL LIGHT HANDLE (MISCELLANEOUS) ×4 IMPLANT
DRAIN CHANNEL 32F RND 10.7 FF (WOUND CARE) IMPLANT
DRAPE CARDIOVASCULAR INCISE (DRAPES) ×2
DRAPE CV SPLIT W-CLR ANES SCRN (DRAPES) IMPLANT
DRAPE HALF SHEET 40X57 (DRAPES) IMPLANT
DRAPE INCISE 23X17 IOBAN STRL (DRAPES) ×2
DRAPE INCISE 23X17 STRL (DRAPES) IMPLANT
DRAPE INCISE IOBAN 23X17 STRL (DRAPES) ×2 IMPLANT
DRAPE INCISE IOBAN 66X45 STRL (DRAPES) IMPLANT
DRAPE PERI GROIN 82X75IN TIB (DRAPES) IMPLANT
DRAPE SRG 135X102X78XABS (DRAPES) ×2 IMPLANT
DRSG AQUACEL AG ADV 3.5X14 (GAUZE/BANDAGES/DRESSINGS) ×2 IMPLANT
DRSG TEGADERM 4X4.5 CHG (GAUZE/BANDAGES/DRESSINGS) IMPLANT
DRSG VAC ATS LRG SENSATRAC (GAUZE/BANDAGES/DRESSINGS) IMPLANT
ELECT CAUTERY BLADE 6.4 (BLADE) IMPLANT
ELECT REM PT RETURN 9FT ADLT (ELECTROSURGICAL) ×4
ELECTRODE REM PT RTRN 9FT ADLT (ELECTROSURGICAL) ×4 IMPLANT
FELT TEFLON 1X6 (MISCELLANEOUS) IMPLANT
GAUZE 4X4 16PLY ~~LOC~~+RFID DBL (SPONGE) ×2 IMPLANT
GAUZE SPONGE 4X4 12PLY STRL (GAUZE/BANDAGES/DRESSINGS) ×4 IMPLANT
GAUZE XEROFORM 5X9 LF (GAUZE/BANDAGES/DRESSINGS) IMPLANT
GLOVE BIO SURGEON STRL SZ 6 (GLOVE) IMPLANT
GLOVE BIO SURGEON STRL SZ 6.5 (GLOVE) IMPLANT
GLOVE BIO SURGEON STRL SZ7 (GLOVE) IMPLANT
GLOVE BIO SURGEON STRL SZ7.5 (GLOVE) IMPLANT
GLOVE SKINSENSE NS SZ6.5 (GLOVE)
GLOVE SKINSENSE STRL SZ6.5 (GLOVE) IMPLANT
GOWN STRL REUS W/ TWL LRG LVL3 (GOWN DISPOSABLE) ×24 IMPLANT
GOWN STRL REUS W/TWL LRG LVL3 (GOWN DISPOSABLE) ×8
HEMOSTAT POWDER SURGIFOAM 1G (HEMOSTASIS) IMPLANT
HEMOSTAT SURGICEL 2X14 (HEMOSTASIS) IMPLANT
INSERT FOGARTY XLG (MISCELLANEOUS) IMPLANT
KIT BASIN OR (CUSTOM PROCEDURE TRAY) ×2 IMPLANT
KIT SUCTION CATH 14FR (SUCTIONS) IMPLANT
KIT TURNOVER KIT B (KITS) ×2 IMPLANT
KIT VASOVIEW HEMOPRO 2 VH 4000 (KITS) IMPLANT
LINE VENT (MISCELLANEOUS) IMPLANT
NS IRRIG 1000ML POUR BTL (IV SOLUTION) ×10 IMPLANT
PACK CHEST (CUSTOM PROCEDURE TRAY) ×2 IMPLANT
PACK E OPEN HEART (SUTURE) ×2 IMPLANT
PAD ARMBOARD 7.5X6 YLW CONV (MISCELLANEOUS) ×4 IMPLANT
PAD ELECT DEFIB RADIOL ZOLL (MISCELLANEOUS) ×2 IMPLANT
POSITIONER HEAD DONUT 9IN (MISCELLANEOUS) ×2 IMPLANT
SET CARDIOPLEGIA MPS 5001102 (MISCELLANEOUS) IMPLANT
SOL ANTI FOG 6CC (MISCELLANEOUS) IMPLANT
SOLUTION ANTI FOG 6CC (MISCELLANEOUS)
SPONGE T-LAP 18X18 ~~LOC~~+RFID (SPONGE) ×8 IMPLANT
SPONGE T-LAP 4X18 ~~LOC~~+RFID (SPONGE) ×2 IMPLANT
STOPCOCK 4 WAY LG BORE MALE ST (IV SETS) IMPLANT
SUT ETHIBOND 2 0 SH (SUTURE)
SUT ETHIBOND 2 0 SH 36X2 (SUTURE) IMPLANT
SUT PROLENE 2 0 MH 48 (SUTURE) IMPLANT
SUT PROLENE 3 0 SH 1 (SUTURE) IMPLANT
SUT PROLENE 3 0 SH 48 (SUTURE) IMPLANT
SUT PROLENE 3 0 SH DA (SUTURE) IMPLANT
SUT PROLENE 4 0 RB 1 (SUTURE) ×14
SUT PROLENE 4 0 SH DA (SUTURE) IMPLANT
SUT PROLENE 4-0 RB1 .5 CRCL 36 (SUTURE) IMPLANT
SUT PROLENE 5 0 C 1 36 (SUTURE) IMPLANT
SUT PROLENE 6 0 C 1 30 (SUTURE) IMPLANT
SUT PROLENE 6 0 CC (SUTURE) IMPLANT
SUT PROLENE 7 0 BV 1 (SUTURE) IMPLANT
SUT PROLENE 7 0 BV1 MDA (SUTURE) IMPLANT
SUT PROLENE 8 0 BV175 6 (SUTURE) IMPLANT
SUT SILK  1 MH (SUTURE) ×16
SUT SILK 1 MH (SUTURE) ×6 IMPLANT
SUT SILK 1 TIES 10X30 (SUTURE) IMPLANT
SUT SILK 2 0 SH CR/8 (SUTURE) IMPLANT
SUT STEEL 6MS V (SUTURE) IMPLANT
SUT STEEL STERNAL CCS#1 18IN (SUTURE) IMPLANT
SUT STEEL SZ 6 DBL 3X14 BALL (SUTURE) IMPLANT
SUT TEM PAC WIRE 2 0 SH (SUTURE) IMPLANT
SUT VIC AB 1 CTX 36 (SUTURE) ×4
SUT VIC AB 1 CTX36XBRD ANBCTR (SUTURE) ×4 IMPLANT
SUT VIC AB 2-0 CT1 36 (SUTURE) IMPLANT
SUT VIC AB 2-0 CTX 27 (SUTURE) IMPLANT
SUT VIC AB 3-0 SH 27 (SUTURE)
SUT VIC AB 3-0 SH 27X BRD (SUTURE) IMPLANT
SUT VIC AB 3-0 X1 27 (SUTURE) ×4 IMPLANT
SUT VICRYL 4-0 PS2 18IN ABS (SUTURE) IMPLANT
SWAB CULTURE ESWAB REG 1ML (MISCELLANEOUS) IMPLANT
SYR BULB IRRIG 60ML STRL (SYRINGE) IMPLANT
SYSTEM SAHARA CHEST DRAIN ATS (WOUND CARE) IMPLANT
TOWEL GREEN STERILE (TOWEL DISPOSABLE) ×2 IMPLANT
TOWEL GREEN STERILE FF (TOWEL DISPOSABLE) ×2 IMPLANT
TRAY CATH LUMEN 1 20CM STRL (SET/KITS/TRAYS/PACK) IMPLANT
TUBING LAP HI FLOW INSUFFLATIO (TUBING) IMPLANT
UNDERPAD 30X36 HEAVY ABSORB (UNDERPADS AND DIAPERS) ×2 IMPLANT
WATER STERILE IRR 1000ML POUR (IV SOLUTION) ×4 IMPLANT

## 2021-10-03 NOTE — Progress Notes (Signed)
Echocardiogram Echocardiogram Transesophageal has been performed.  Melva Faux L Anihya Tuma 10/03/2021, 9:13 AM 

## 2021-10-03 NOTE — Anesthesia Preprocedure Evaluation (Signed)
Anesthesia Evaluation  Patient identified by MRN, date of birth, ID band Patient unresponsive    Reviewed: Allergy & Precautions, H&P , NPO status , Patient's Chart, lab work & pertinent test results  Airway Mallampati: Intubated       Dental   Pulmonary Patient abstained from smoking., former smoker,  Intubated.   breath sounds clear to auscultation       Cardiovascular hypertension, + CAD, + Past MI and + CABG  + Valvular Problems/Murmurs AI  Rhythm:regular Rate:Normal  EF 20%, on ECMO.   Neuro/Psych CVA    GI/Hepatic GERD  ,  Endo/Other  diabetes, Type 2  Renal/GU Renal InsufficiencyRenal disease     Musculoskeletal   Abdominal   Peds  Hematology  (+) Blood dyscrasia, anemia ,   Anesthesia Other Findings   Reproductive/Obstetrics                             Anesthesia Physical Anesthesia Plan  ASA: 4  Anesthesia Plan: General   Post-op Pain Management:    Induction: Intravenous  PONV Risk Score and Plan: 2 and Treatment may vary due to age or medical condition  Airway Management Planned: Oral ETT  Additional Equipment: TEE  Intra-op Plan:   Post-operative Plan: Post-operative intubation/ventilation  Informed Consent: I have reviewed the patients History and Physical, chart, labs and discussed the procedure including the risks, benefits and alternatives for the proposed anesthesia with the patient or authorized representative who has indicated his/her understanding and acceptance.       Plan Discussed with: CRNA, Anesthesiologist and Surgeon  Anesthesia Plan Comments:         Anesthesia Quick Evaluation

## 2021-10-03 NOTE — Progress Notes (Addendum)
Chest tubes draining ~454mL/hr. Labs drawn at [place]. Hbg 7.3, PLT 68, INR 0.9. RN called [facility] and ordered 1 uPLT and 2uRBCs. Lucianne Lei Trigt called and ordered 2 FFP and 48mcg of factor 7.   Labs redrawn at 2255 and resulted in Hbg 6.6 and PLT at 83 4 RBCs ordered and Kcentra 2,159units given  0215 Patient lost pacing capture and pulsatility. Patient's intrinsic rate in the 50s. Intermittently would regain pulsatility. 2 albumins given for chugging. [facility] paged and ordered dobutamine at 2.52mcg and if Hgb <8 with morning labs, then to transfuse 2u RBCs  0500 2uRBCs for hemoglobin of 7.6. Doubtamine switched to Epi for RV support per Bensimhon. 2 more albumins given. Currently maxed out on levo at 97mcg. Chest tube output still putting out around 350/hr.   Total products given overnight 8 RBCs 2 PLT 2 FFP DDAVP Factor 7  Kcentra 4 albumins

## 2021-10-03 NOTE — Brief Op Note (Signed)
10/03/2021  12:18 PM  PATIENT:  [name]  61 y.o. male  PRE-OPERATIVE DIAGNOSIS:  [facility] with open chest  POST-OPERATIVE DIAGNOSIS:  [facility] ECMO with open chest  PROCEDURE:  Procedure(s) with comments: MEDIASTINAL WASHOUT (N/A) - PUMP STANDBY TRANSESOPHAGEAL ECHOCARDIOGRAM (TEE) (N/A) APPLICATION OF WOUND VAC (N/A) Removal of posterior mediastinal hematoma  SURGEON:  Surgeon(s) and Role:    Dahlia Byes, MD - Primary  PHYSICIAN ASSISTANT:   ASSISTANTS: RNFA   ANESTHESIA:   general  EBL:  200 mL   BLOOD ADMINISTERED: 2 units CC PRBC  1 u plts, 2 U FFP DRAINS:  L Pleural,   [3]  Mediastinal -- posterior, lateral and anterior    LOCAL MEDICATIONS USED:  NONE  SPECIMEN:  cultutres of sternal wound  DISPOSITION OF SPECIMEN:   microbiology  COUNTS:  YES  TOURNIQUET:  * No tourniquets in log *  DICTATION: .Dragon Dictation  PLAN OF CARE:  return to [facility] ICU  PATIENT DISPOSITION:  ICU - intubated and hemodynamically stable.   Delay start of Pharmacological VTE agent (>24hrs) due to surgical blood loss or risk of bleeding: yes

## 2021-10-03 NOTE — Progress Notes (Signed)
NAME:  [name], MRN:  [id number], DOB:  March 18, 1960, LOS: 5 ADMISSION DATE:  09/28/2021, CONSULTATION DATE:  09/29/21 REFERRING MD:  Dr. Kipp Brood, CHIEF COMPLAINT:  Cardiac Arrest   History of Present Illness:  [name] is a 61 year old male with DMII, , CVA, atrial fibrillation on eliquis and multivessel CAD s/p 5 vessel CABG on 09/12/21 who presented 8/19 with shortness of breath via EMS. He was found to be bradycardic and hypoglycemic. He symptomatically improved with dextrose but continued to have bradycardia and soft blood pressures. ECHO obtained which showed LVEF 50%, grade II diastolic dysfunction and moderate pericardial effusion with possible tamponade physiology. CT Chest showed pericardial effusion and small bilateral pleural effusions. He was taken to the OR 8/19 for pericardial window.   Patient started having issues with tachy-brady arrhythmias on telemetry, decreased urine output and increased work of breathing over night. A 14 french pigtail catheter was placed in the left pleural space this morning with bloody drainage. At 10:35am patient became unresponsive with PEA arrest. He underwent multiple rounds of CPR with epinephrine pushes. ROSC was obtained and patient was intubated. Bedside US by cardiology showed large pericardial effusion with tamponade physiology. He lost pulse again and CPR was again performed. Please see code sheet for full details. Bedside sternotomy performed with pericardial drainage by CT surgery, immediate improvement in hemodynamics achieved with drainage of the pericardial space.   Pertinent  Medical History   Past Medical History:  Diagnosis Date   AKI (acute kidney injury) ([place])    pt unaware of this   Anginal pain ([place])    CAD (coronary artery disease)    a. 03/2015 NSTEMI: LHC with severe 3V CAD  (70% mid RCA, 95% OM1, 90% distal LCx, 90% OM3, 80% prox LAD and 90% ost D1) s/p DES to mLAD w/ small dissction Rx with DES, staged ost Ramus PCI/DES and  dLCx s/p PCI/DES    Chest pain 12/24/2020   Diabetes mellitus type 2 in obese [facility])    Diverticulosis    Dyspnea    Dyspnea on exertion 03/16/2015   Dyspnea on exertion   Family history of adverse reaction to anesthesia    patient father- pt states after anesthesia his father "developed dementia"   GERD (gastroesophageal reflux disease)    Hypercholesteremia    Hypertension associated with diabetes ([place]) 03/16/2015   hypertension   NSTEMI (non-ST elevated myocardial infarction) ([place]) 03/17/2015   Obesity    Stroke ([place]) 2022   pt states he had a "mini stroke" during cardiac catheterization   Tobacco abuse    Significant Hospital Events: Including procedures, antibiotic start and stop dates in addition to other pertinent events   8/19 admitted, s/p pericardial window 8/20 PEA cardiac arrest, left pigtail chest tube placement, PEA cardiac arrest due to tamponade, bedside sternotomy with pericardial drains placed. 8/21 return to OR for overnight bleeding.  8/22 minimal chest tube output.  Atrial fibrillation, controled with amiodarone.  8/22 TEE showed EF 35% at baseline, which improved significantly with decreasing ECMO flow.  8/23 tolerated diuresis   Interim History / Subjective:   Drains remain dry, have weaned off vasopressors. Continues to have self-terminating runs of Afib.  Scheduled for washout.   Objective   Blood pressure 98/77, pulse (!) 50, temperature 97.7 F (36.5 C), resp. rate 20, height 5\' 7"  (1.702 m), weight 104.3 kg, SpO2 100 %. CVP:  [8 mmHg-21 mmHg] 12 mmHg  Vent Mode: PRVC FiO2 (%):  [21 %]  21 % Set Rate:  [16 bmp-20 bmp] 20 bmp Vt Set:  [390 mL] 390 mL PEEP:  [5 cmH20] 5 cmH20 Plateau Pressure:  [17 cmH20-21 cmH20] 21 cmH20   Intake/Output Summary (Last 24 hours) at 10/03/2021 0739 Last data filed at 10/03/2021 0700 Gross per 24 hour  Intake 3534.28 ml  Output 3350 ml  Net 184.28 ml    Filed Weights   10/01/21 0500 10/02/21 0545 10/03/21 0500   Weight: 105.2 kg 102.4 kg 104.3 kg   Examination: General: ill appearing male, intubated, sedated HENT: [place]/AT, moist mucous membranes, sclera anicteric Lungs: course breath sounds, left chest tube in place, no air leak noted. Pigtail flushes but not draining.  Cardiovascular: rrr, pericardial drains in place, open sternotomy wound with surgical dressing in place Abdomen: soft, bowel sounds dminished Extremities: warm, moderate edema.  Neuro: sedated, flickers eyes to voice.  GU: foley in place  Ancillary tests personally review:  pCO2 52 with pH 7.38 Creatinine 2.78 INR 1.5 LDH: 250 CXR shows clear lung fields HB: 8.3 Assessment & Plan:  PEA Cardiac Arrest Cardiac Tamponade s/p pericardial drain placement Hemorraghic Pericarditis Obstructive Shock Coronary Artery Disease s/p CABG x 5 8/3 Atrial Fibrillation - CT surgery and Cardiology following - Obstructive shock relieved with pericardial drain placement,  - Continue levophed  for MAP >65 - Amiodarone for atrial fibrillation, continue at 30 as looses pulsatility in atrial fibrillation.  - Aspirin 325mg  BID and colchicine for pericarditis - Continue statin - For washout today.   Acute Hypoxemic and Hypercapnic Respiratory Failure - continue mechanical ventilation, lung protective strategy - Transition to more normal ventilator settings. Reduced inspiratory time and increase respiratory rate to achieve normal minute ventilation.  - Wean FiO2 for SpO2 92% or greater - PFTs in 2022 were suggestive of restrictive defect - PRN duonebs ordered  Acute Kidney 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 Hyponatremia, mild - Monitor Cr and UOP - trend electrolytes - Diurese today postoperatively.   Sepsis due to Pericarditis - treatment as above - Will broadly cover with vancomycin + cefepime due to bedside sternotomy procedure  Acute Blood Loss Anemia - Maintain hemoglobin 8-10g/dL  Hx of DMII - monitor blood sugars - SSI if needed  Best  Practice (right click and "Reselect all SmartList Selections" daily)   Diet/type: NPO - Cortrak today and initiate post-pyloric feeding.  DVT prophylaxis: not indicated GI prophylaxis: PPI Lines: Central line and yes and it is still needed Foley:  Yes, and it is still needed Code Status:  full code Last date of multidisciplinary goals of care discussion [patient's friends updated 8/20]  CRITICAL CARE Performed by: Kipp Brood   Total critical care time: 44minutes  Critical care time was exclusive of separately billable procedures and treating other patients.  Critical care was necessary to treat or prevent imminent or life-threatening deterioration.  Critical care was time spent personally by me on the following activities: development of treatment plan with patient and/or surrogate as well as nursing, discussions with consultants, evaluation of patient's response to treatment, examination of patient, obtaining history from patient or surrogate, ordering and performing treatments and interventions, ordering and review of laboratory studies, ordering and review of radiographic studies, pulse oximetry, re-evaluation of patient's condition and participation in multidisciplinary rounds.  Kipp Brood, MD [facility] ICU Physician [place]  Pager: [phone] Mobile: [phone] After hours: [phone].

## 2021-10-03 NOTE — CV Procedure (Signed)
ECMO NOTE:   Indication: Cardiac arrest   Initial cannulation date: 09/29/21   ECMO type: [facility] ECMO   Dual lumen inflow/return cannula:   1) 23/25 FR multistage venous return cannula in RFV 2) Central aortic arterial return   ECMO events:   - 8/19 Pericardial window - 8/20 Cardiac arrest with tamponade -> Emergent bedside washout - 09/29/21 VA Cannulation - 09/30/21 Return to OR for mediastinal hemorrhage - 8/22 AF - 8/23 TEE: EF 25-30% Moderate to severe RV dysfunction  Daily data:   Flow 4.17 L RPM 3300 Sweep  3.5 L   Labs:   ABG    Component Value Date/Time   PHART 7.470 (H) 10/03/2021 0905   PCO2ART 41.5 10/03/2021 0905   PO2ART 544 (H) 10/03/2021 0905   HCO3 30.2 (H) 10/03/2021 0905   TCO2 28 10/03/2021 0914   ACIDBASEDEF 5.0 (H) 09/29/2021 1443   O2SAT 100 10/03/2021 0905      Plan:  - Remains intubated/sedated - Diuresed yesterday.  - For OR today for washout  - Continue ECMO support   Discussed in multidisciplinary fashion with CCM, TCTS, Cardiology, ECMO coordinator/specialist, RT, PharmD and nursing staff all present.     Glori Bickers, MD  9:46 AM

## 2021-10-03 NOTE — Progress Notes (Signed)
EVENING ROUNDS NOTE :     [address]       [place], [address]             [phone]                 Day of Surgery Procedure(s) (LRB): MEDIASTINAL WASHOUT (N/A) TRANSESOPHAGEAL ECHOCARDIOGRAM (TEE) (N/A) APPLICATION OF WOUND VAC (N/A)   Total Length of Stay:  LOS: 5 days  Events:   No events Stable flows Will monitor CT output    BP (!) 84/61   Pulse 80   Temp 97.9 F (36.6 C)   Resp 20   Ht 5\' 7"  (1.702 m)   Wt 104.3 kg   SpO2 100%   BMI 36.01 kg/m   CVP:  [6 mmHg-17 mmHg] 8 mmHg  Vent Mode: PRVC FiO2 (%):  [21 %] 21 % Set Rate:  [20 bmp] 20 bmp Vt Set:  [390 mL] 390 mL PEEP:  [5 cmH20] 5 cmH20 Plateau Pressure:  [19 cmH20-22 cmH20] 19 cmH20   sodium chloride     albumin human     amiodarone 30 mg/hr (10/03/21 1600)   fentaNYL infusion INTRAVENOUS 200 mcg/hr (10/03/21 1600)   meropenem (MERREM) IV Stopped (10/03/21 0555)   midazolam 3 mg/hr (10/03/21 1600)   norepinephrine (LEVOPHED) Adult infusion 7 mcg/min (10/03/21 1600)   [START ON 10/04/2021] vancomycin      I/O last 3 completed shifts: In: 4989.7 [I.V.:2042.9; Blood:1056.7; NG/GT:1190; IV Piggyback:700.1] Out: 3940 [Urine:3170; Drains:100; Chest Tube:670]      Latest Ref Rng & Units 10/03/2021    4:15 PM 10/03/2021    4:12 PM 10/03/2021    1:48 PM  CBC  WBC 4.0 - 10.5 K/uL  11.0    Hemoglobin 13.0 - 17.0 g/dL 7.1  7.6  7.8   Hematocrit 39.0 - 52.0 % 21.0  22.6  23.0   Platelets 150 - 400 K/uL  74         Latest Ref Rng & Units 10/03/2021    4:15 PM 10/03/2021    4:12 PM 10/03/2021    1:48 PM  BMP  Glucose 70 - 99 mg/dL  171    BUN 8 - 23 mg/dL  52    Creatinine 0.61 - 1.24 mg/dL  2.50    Sodium 135 - 145 mmol/L 142  142  144   Potassium 3.5 - 5.1 mmol/L 3.9  4.0  3.8   Chloride 98 - 111 mmol/L  111    CO2 22 - 32 mmol/L  28    Calcium 8.9 - 10.3 mg/dL  6.9      ABG    Component Value Date/Time   PHART 7.414 10/03/2021 1615   PCO2ART 48.1 (H) 10/03/2021 1615    PO2ART 457 (H) 10/03/2021 1615   HCO3 30.9 (H) 10/03/2021 1615   TCO2 32 10/03/2021 1615   ACIDBASEDEF 5.0 (H) 09/29/2021 1443   O2SAT 100 10/03/2021 1615       Melodie Bouillon, MD 10/03/2021 5:14 PM

## 2021-10-03 NOTE — Transfer of Care (Signed)
Immediate Anesthesia Transfer of Care Note  Patient: [name]  Procedure(s) Performed: MEDIASTINAL WASHOUT (Chest) TRANSESOPHAGEAL ECHOCARDIOGRAM (TEE) APPLICATION OF WOUND VAC (Chest)  Patient Location: ICU  Anesthesia Type:General  Level of Consciousness: Patient remains intubated per anesthesia plan  Airway & Oxygen Therapy: Patient remains intubated per anesthesia plan and Patient placed on Ventilator (see vital sign flow sheet for setting)  Post-op Assessment: Report given to RN and Post -op Vital signs reviewed and stable  Post vital signs: Reviewed and stable  Last Vitals:  Vitals Value Taken Time  BP    Temp    Pulse    Resp    SpO2      Last Pain:  Vitals:   10/03/21 0400  TempSrc: Bladder  PainSc:          Complications: No notable events documented.

## 2021-10-03 NOTE — Progress Notes (Signed)
Advanced Heart Failure Rounding Note   Subjective:    - 8/19 Pericardial window - 8/20 Cardiac arrest with tamponade -> Emergent bedside washout - 09/29/21 VA Cannulation - 09/30/21 Return to OR for mediastinal hemorrhage - 10/01/21 Developed AF -> amio - 10/02/21 TEE EF 25-30%   Remains intubated/sedated. On VA ECMO with open chest. Off heparin. Circuit flowing well. LDH 192-> 255  NE off this am. Diuresed well with IV lasix.  On amio. In NSR.   Bleeding has essentially resolved but hgb still drifting down. Got 2u RBCs.   SCr stable at 2.7  To OR today for washout   Objective:   Weight Range:  Vital Signs:   Temp:  [97.5 F (36.4 C)-97.7 F (36.5 C)] 97.7 F (36.5 C) (08/24 0745) Pulse Rate:  [31-58] 58 (08/24 0747) Resp:  [15-23] 20 (08/24 0747) BP: (97)/(72) 97/72 (08/24 0747) SpO2:  [100 %] 100 % (08/24 0747) Arterial Line BP: (84-128)/(54-78) 103/76 (08/24 0745) FiO2 (%):  [21 %] 21 % (08/24 0747) Weight:  [104.3 kg] 104.3 kg (08/24 0500) Last BM Date :  (t)  Weight change: Filed Weights   10/01/21 0500 10/02/21 0545 10/03/21 0500  Weight: 105.2 kg 102.4 kg 104.3 kg    Intake/Output:   Intake/Output Summary (Last 24 hours) at 10/03/2021 0948 Last data filed at 10/03/2021 0931 Gross per 24 hour  Intake 3629.51 ml  Output 4452 ml  Net -822.49 ml      Physical Exam: General:  Intubated/sedated  HEENT: normal +ETT Neck: bilateral TLCs.  Cor: chest open packed. Regular Lungs: clear Abdomen: soft, nontender, nondistended. No hepatosplenomegaly. No bruits or masses. Good bowel sounds. Extremities: no cyanosis, clubbing, rash, 1-2+ edema RFV venous cannula Neuro: sedated/unresponsive   Telemetry: Sinus 50 + PACs Personally reviewed  Labs: Basic Metabolic Panel: Recent Labs  Lab 09/30/21 1650 09/30/21 1936 10/01/21 0430 10/01/21 0431 10/01/21 1616 10/01/21 1623 10/02/21 0354 10/02/21 0528 10/02/21 1607 10/02/21 1614 10/02/21 2124  10/03/21 0408 10/03/21 0414 10/03/21 0905 10/03/21 0914  NA 139   < > 138   < > 141   < > 140   < > 140   < > 142 142 141 143 144  K 4.0   < > 4.1   < > 4.3   < > 4.2   < > 3.6   < > 3.7 4.2 4.2 4.2 4.2  CL 106  --  109  --  108  --  109  --  109  --  110  --  111  --  107  CO2 28  --  26  --  28  --  26  --  25  --  24  --  26  --   --   GLUCOSE 126*  --  196*  --  183*  --  177*  --  175*  --  162*  --  149*  --  140*  BUN 38*  --  39*  --  43*  --  47*  --  51*  --  51*  --  53*  --  49*  CREATININE 2.44*  --  2.47*  --  2.62*  --  2.77*  --  2.73*  --  2.71*  --  2.78*  --  2.70*  CALCIUM 7.6*  --  7.2*  --  7.4*  --  7.4*  --  7.2*  --  7.1*  --  7.3*  --   --  MG 1.7  --  2.6*  --  2.8*  --  2.7*  --   --   --  2.5*  --  2.6*  --   --   PHOS 4.6  --  4.5  --  4.3  --  4.2  --   --   --   --   --  3.6  --   --    < > = values in this interval not displayed.     Liver Function Tests: Recent Labs  Lab 09/29/21 1956 09/30/21 0835 10/01/21 0430 10/02/21 0354 10/03/21 0414  AST 197* 94* 45* 43* 46*  ALT 82* 23 19 17 18   ALKPHOS 53 31* 43 74 116  BILITOT 1.7* 2.2* 2.3* 1.2 1.1  PROT 3.7* 3.9* 3.6* 3.8* 4.1*  ALBUMIN 2.0* 2.9* 2.3* 2.0* 2.0*    No results for input(s): "LIPASE", "AMYLASE" in the last 168 hours. No results for input(s): "AMMONIA" in the last 168 hours.  CBC: Recent Labs  Lab 09/29/21 1956 09/29/21 2152 10/02/21 0120 10/02/21 0527 10/02/21 0528 10/02/21 1200 10/02/21 1210 10/02/21 1607 10/02/21 1614 10/02/21 2010 10/03/21 0408 10/03/21 0414 10/03/21 0905 10/03/21 0914  WBC 17.7*   < > 12.5* 10.8*  --  10.3  --  10.2  --   --   --  9.6  --   --   NEUTROABS 13.0*  --   --   --   --   --   --   --   --   --   --   --   --   --   HGB 8.2*   < > 6.5* 7.3*   < > 8.2*   < > 7.7*   < > 8.2* 8.5* 8.3* 7.8* 7.8*  HCT 24.4*   < > 19.4* 22.2*   < > 24.4*   < > 23.1*   < > 24.0* 25.0* 25.6* 23.0* 23.0*  MCV 82.2   < > 87.4 85.7  --  84.1  --  84.6  --   --    --  87.1  --   --   PLT 258   < > 97* 93*  --  89*  --  86*  --   --   --  83*  --   --    < > = values in this interval not displayed.     Cardiac Enzymes: No results for input(s): "CKTOTAL", "CKMB", "CKMBINDEX", "TROPONINI" in the last 168 hours.  BNP: BNP (last 3 results) Recent Labs    12/24/20 0651 09/28/21 0405  BNP 412.0* 826.6*     ProBNP (last 3 results) No results for input(s): "PROBNP" in the last 8760 hours.    Other results:  Imaging: DG Chest Port 1 View  Result Date: 10/03/2021 CLINICAL DATA:  ECMO EXAM: PORTABLE CHEST 1 VIEW COMPARISON:  Chest x-ray dated October 02, 2021 FINDINGS: ETT tip is proximally 2.4 cm from the carina. Unchanged position of bilateral IJ lines, feeding tube, and multiple left-sided chest tubes. ECMO cannula in place. Visualized cardiac and mediastinal contours are unchanged. Unchanged left basilar lung opacity. IMPRESSION: 1. Stable support devices. 2. Unchanged left basilar lung opacity. Electronically Signed   By: Yetta Glassman M.D.   On: 10/03/2021 08:07   DG CHEST PORT 1 VIEW  Result Date: 10/02/2021 CLINICAL DATA:  ET and chest tubes on ECMO. EXAM: PORTABLE CHEST 1 VIEW COMPARISON:  Radiograph October 01, 2021 FINDINGS: Endotracheal tube in  the distal thoracic trachea proximally 2.5 cm from the carina. Enteric feeding catheter courses below the diaphragm with tip obscured by collimation. Stable mediastinal and thoracic drains. Stable position of the ECMO cannula. Stable bilateral IJ central venous catheters. Left-sided pigtail thoracostomy tube. Similar cardiac enlargement. No significant interval change in the opacities in the left lung base which may reflect atelectasis or consolidation. Right lung is predominantly clear. No visible pneumothorax. IMPRESSION: 1. Stable lines and tubes. 2. No significant interval change in cardiomegaly or opacity in the left lung base. Electronically Signed   By: Dahlia Bailiff M.D.   On: 10/02/2021  08:17     Medications:     Scheduled Medications:  [MAR Hold] sodium chloride   Intravenous Once   sodium chloride   Intravenous Once   [MAR Hold] aspirin EC  325 mg Oral BID   Or   [MAR Hold] aspirin  324 mg Per Tube BID   [MAR Hold] atorvastatin  80 mg Per Tube Daily   atropine       [MAR Hold] bisacodyl  10 mg Oral Daily   Or   [MAR Hold] bisacodyl  10 mg Rectal Daily   calcium chloride       [MAR Hold] Chlorhexidine Gluconate Cloth  6 each Topical Daily   [MAR Hold] colchicine  0.6 mg Per Tube Daily   [MAR Hold] docusate  100 mg Per Tube BID   [MAR Hold] enoxaparin (LOVENOX) injection  30 mg Subcutaneous Q24H   EPINEPHrine       [MAR Hold] feeding supplement (PROSource TF20)  60 mL Per Tube QID   [MAR Hold] fentaNYL (SUBLIMAZE) injection  50 mcg Intravenous Once   [MAR Hold] insulin aspart  2-6 Units Subcutaneous Q4H   [MAR Hold] mouth rinse  15 mL Mouth Rinse Q2H   [MAR Hold] pantoprazole (PROTONIX) IV  40 mg Intravenous QHS   [MAR Hold] polyethylene glycol  17 g Per Tube Daily   sodium bicarbonate       [MAR Hold] sodium chloride flush  10-40 mL Intracatheter Q12H   [MAR Hold] sodium chloride flush  3 mL Intravenous Q12H    Infusions:  sodium chloride     sodium chloride 20 mL/hr at 10/03/21 0831   [MAR Hold] albumin human     amiodarone 30 mg/hr (10/03/21 0831)   fentaNYL infusion INTRAVENOUS 200 mcg/hr (10/03/21 0831)   [MAR Hold] meropenem (MERREM) IV Stopped (10/03/21 0555)   midazolam 3 mg/hr (10/03/21 0831)   [MAR Hold] norepinephrine (LEVOPHED) Adult infusion Stopped (10/03/21 0648)   [MAR Hold] vancomycin Stopped (10/02/21 1505)    PRN Medications: [MAR Hold] acetaminophen, [MAR Hold] albumin human, [MAR Hold] artificial tears, atropine, calcium chloride, EPINEPHrine, [MAR Hold] fentaNYL, [MAR Hold] ipratropium-albuterol, [MAR Hold] metoprolol tartrate, [MAR Hold] midazolam, [MAR Hold] midazolam, [MAR Hold] nitroGLYCERIN, [MAR Hold] ondansetron (ZOFRAN)  IV, [MAR Hold] mouth rinse, [MAR Hold] oxyCODONE, sodium bicarbonate, [MAR Hold] sodium chloride flush, sodium chloride irrigation, [MAR Hold] traMADol, vancomycin   Assessment/Plan:    1. Shock - mixed cardiogenic/hemorrhagic - continue VA ECMO support + pressors as needed. No heparin - Transfuse keep hgb >= 8.0 - TEE 10/02/21 LVEF 25-30% RV mild to moderately down - Continue diuresis - To OR today for washout - Continue ECMO. Will need to reassess if he will come off directly or will need Impella bridge - Reassess ability to start low-dose AC after OR 2. Cardiac arrest (PEA/bradycardic) - 8/19 in setting of tamponade - rhythm stable 3. Cardiac  tamponade with emergent bedside sternotomy  - for washout today 4. Hemorrhagic shock - return to OR 8/21 - bleeding now stabilized but hgb trending down - transfuse to keep Hgb >= 8.0 5. Acute hypoxemic respiratory failure - Now on VA ECMO - Vent support per CCM - adjusted to day on rounds 6. 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 DM2 - continue SSI 10. PAF - continue amio - In NSR today  CRITICAL CARE Performed by: Glori Bickers  Total critical care time: 40 minutes  Critical care time was exclusive of separately billable procedures and treating other patients.  Critical care was necessary to treat or prevent imminent or life-threatening deterioration.  Critical care was time spent personally by me (independent of midlevel providers or residents) on the following activities: development of treatment plan with patient and/or surrogate as well as nursing, discussions with consultants, evaluation of patient's response to treatment, examination of patient, obtaining history from patient or surrogate, ordering and performing treatments and interventions, ordering and review  of laboratory studies, ordering and review of radiographic studies, pulse oximetry and re-evaluation of patient's condition.  Length of Stay: [place] 10/03/2021, 9:48 AM  Advanced Heart Failure Team Pager [phone] (M-F; 7a - 4p)  Please contact [facility] for night-coverage after hours (4p -7a ) and weekends on [email or web address]

## 2021-10-03 NOTE — Progress Notes (Addendum)
Pharmacy Antibiotic Note  [name] is a 61 y.o. male admitted on 09/28/2021 s/p pericardial window > now with open chest on [facility] ECMO  Pharmacy has been consulted for vancomycin and meropenem dosing.  Received vancomycin 1gm prior to OR 8/19 and 1500 mg vancomycin in OR 8/20 pm > vancomycin 1gm IV q24h with Vancomycin trough 21  - slightly > goal. Vancomycin dosing was continued with annticipated renal function and plans for OR today. Cr remains slightly elevated today at 2.78. Patient had vanc powder sprinkled in OR today and packing was removed. Given consistently high creatinine with continued diuresis, plan to decrease vanc tomorrow and delay next dose until PM.  Plan: Meropenem 1g q 12 hrs for now, will adjust as needed with renal function. Vancomycin 750 mg IV q24hr  - monitor renal function for dosing needs Levels at steady state  Height: 5\' 7"  (170.2 cm) Weight: 104.3 kg (229 lb 15 oz) IBW/kg (Calculated) : 66.1  Temp (24hrs), Avg:97.7 F (36.5 C), Min:97.3 F (36.3 C), Max:97.7 F (36.5 C)  Recent Labs  Lab 09/29/21 1635 09/29/21 1657 09/29/21 1719 09/29/21 1922 09/29/21 1956 09/30/21 0933 09/30/21 1140 09/30/21 1650 10/01/21 0430 10/01/21 1616 10/02/21 0120 10/02/21 0354 10/02/21 0527 10/02/21 1200 10/02/21 1208 10/02/21 1607 10/02/21 2124 10/03/21 0414 10/03/21 0914 10/03/21 1135  WBC  --    < >  --   --    < >  --  12.0*   < > 16.0*   < > 12.5*  --  10.8* 10.3  --  10.2  --  9.6  --   --   CREATININE  --   --  3.05*  --    < >  --  2.48*   < > 2.47*   < >  --    < >  --   --   --  2.73* 2.71* 2.78* 2.70* 2.30*  LATICACIDVEN 5.6*  --  4.6* 2.3*  --   --  1.4  --  1.2  --   --   --   --   --   --   --   --   --   --   --   VANCOTROUGH  --   --   --   --   --   --   --   --   --   --   --   --   --   --  21*  --   --   --   --   --   VANCORANDOM  --   --   --   --   --  16  --   --   --   --   --   --   --   --   --   --   --   --   --   --    < > = values in  this interval not displayed.     Estimated Creatinine Clearance: 38.8 mL/min (A) (by C-G formula based on SCr of 2.3 mg/dL (H)).    No Known Allergies  Antimicrobials this admission:  8/20 Vancomycin > 8/23 VT 21  8/20 Meropenem >   Dose adjustments this admission:   Microbiology results:  None  Thank you for allowing pharmacy to participate in this patient's care.  Reatha Harps, PharmD PGY2 Pharmacy Resident 10/03/2021 2:48 PM Check [email or web address] for unit specific pharmacy number

## 2021-10-03 NOTE — Op Note (Signed)
NAME: [name], [name] MEDICAL RECORD NO: [id number] ACCOUNT NO: [id number] DATE OF BIRTH: 02-Jan-1961 FACILITY: [facility] LOCATION: [facility] PHYSICIAN: Ivin Poot III, MD  Operative Report   DATE OF PROCEDURE: 10/03/2021  OPERATION:  Mediastinal washout and removal of large 8cm x 6cm left intrapericardial mediastinal hematoma.  SURGEON:  Ivin Poot III, MD  PREOPERATIVE DIAGNOSIS:  Open chest VA ECMO for cardiogenic shock.  POSTOPERATIVE DIAGNOSIS:  Open chest VA ECMO for cardiogenic shock.  ANESTHESIA:  General.  CLINICAL NOTE:  The patient is a 61 year old gentleman who had undergone multivessel CABG earlier in the month and was discharged uneventfully.  He returned with arrhythmias and hypotension and pericardial hematoma.  He had undergone CABG x5 by Dr.  Cyndia Bent and right VATS for pericardial window by Dr. Kipp Brood. Four days ago, he had been placed on ECMO for hemodynamic support after sustaining cardiac arrest with emergency open chest resuscitation.  Since it has been over 3 days since his chest had  been left open, covered with sterile dressing, mediastinal washout was recommended and I agreed to proceed with the surgery to help this patient after discussions with Dr. Kipp Brood and Dr. Cyndia Bent for coordination of care.  The most recent echos have  shown significant hematoma in the pericardium and removal of the hematoma was expected to improve his ventricular recovery and hemodynamic performance.  DESCRIPTION OF PROCEDURE:  The patient was brought directly from the ICU to the OR, escorted by the ECMO coordinator and Anesthesia to run the ECMO equipment and monitor the hemodynamic condition.  The patient was placed and transferred to the operating  room in stable condition.  A transesophageal echo probe was placed by the anesthesia team.  This showed poor LV function with EF of 15%.  RV function was not as severely depressed.  The previous sterile dressing was removed and the chest  tubes were removed and the patient was prepped and draped as a sterile field.  A proper timeout was performed.  I carefully removed the Kerlix packing in the open chest and rinsed this with warm  irrigation as I gently removed the packing material.  The aortic cannula in the ascending aorta was intact and hemostatic and secured with tourniquets.  A moderate amount of clotted and non-clotted blood was present in the anterior pericardium, which was  removed without difficulty.  The vein grafts to the right coronary, circumflex and LAD appeared to be patent.  There are no atrial wires present and the patient had been in bradycardia, so 2 epicardial right atrial wires were placed and brought out through the subcostal incision.  Next, the lateral hematomas were removed from the lateral aspect and inferior aspect of the heart using copious irrigation to soften up the hematomas.  These were significant organized clots and helped open the pericardial space.  The patient was given FFP and platelets during the procedure to help with hemostasis.  Topical hemostasis with Surgicel powder was used along the right atrium where the atrium was exposed for pacing wire placement and along the left ventricle where the  hematoma had been carefully and gently extracted.  We also used topical Everest hemostasis product.  I then placed new chest tubes with a 36-French chest tube in the left pleural space, two 32-French fluted catheters in the anterior and lateral pericardium and an angled 28-French posterior mediastinal chest tube.  The chest wound could not be closed due to the aortic cannula in place and the edema of the tissues.  An Esmarch sterile sheet was sewn to the edges of the skin.  Over the suture lines Xeroform gauze was placed, covered with sterile 4 x 4 dressings and  then sterile Ioban sheets in multiple layers over the chest wound to protect and provide a sterile barrier.  The chest tubes were connected to  Pleur-evac suction and the patient was transported carefully back to the ICU in stable condition.   VAI D: 10/03/2021 1:16:16 pm T: 10/03/2021 11:00:00 pm  JOB: [id number]/ [id number]

## 2021-10-03 NOTE — Progress Notes (Signed)
Pre Procedure note for inpatients:   [name] has been scheduled for Procedure(s): EXPLORATION POST OPERATIVE OPEN HEART WASHOUT (N/A) today. The various methods of treatment have been discussed with the patient. After consideration of the risks, benefits and treatment options the patient has consented to the planned procedure.   The patient has been seen and labs reviewed. There are no changes in the patient's condition to prevent proceeding with the planned procedure today.  Recent labs:  Lab Results  Component Value Date   WBC 9.6 10/03/2021   HGB 8.3 (L) 10/03/2021   HCT 25.6 (L) 10/03/2021   PLT 83 (L) 10/03/2021   GLUCOSE 149 (H) 10/03/2021   CHOL 250 (H) 03/12/2021   TRIG 538 (H) 03/12/2021   HDL 31 (L) 03/12/2021   LDLDIRECT 114.3 (H) 03/12/2021   LDLCALC UNABLE TO CALCULATE IF TRIGLYCERIDE OVER 400 mg/dL 03/12/2021   ALT 18 10/03/2021   AST 46 (H) 10/03/2021   NA 141 10/03/2021   K 4.2 10/03/2021   CL 111 10/03/2021   CREATININE 2.78 (H) 10/03/2021   BUN 53 (H) 10/03/2021   CO2 26 10/03/2021   TSH 0.460 03/12/2021   INR 1.5 (H) 10/03/2021   HGBA1C 6.5 (H) 09/10/2021    Dahlia Byes, MD 10/03/2021 6:53 AM

## 2021-10-03 NOTE — Anesthesia Postprocedure Evaluation (Signed)
Anesthesia Post Note  Patient: [name]  Procedure(s) Performed: PERICARDIAL WINDOW (Right)     Patient location during evaluation: SICU Anesthesia Type: General Level of consciousness: responds to stimulation Pain management: pain level controlled Vital Signs Assessment: post-procedure vital signs reviewed and stable Respiratory status: spontaneous breathing, nonlabored ventilation, respiratory function stable and patient connected to nasal cannula oxygen Cardiovascular status: stable Postop Assessment: no apparent nausea or vomiting Anesthetic complications: no   No notable events documented.       Last Vitals:  Vitals Value Taken Time  BP 105/72 09/28/21 1500  Temp    Pulse 50 09/28/21 1502  Resp 31 09/28/21 1502  SpO2 96 % 09/28/21 1502  Vitals shown include unvalidated device data.  Last Pain:     Vitals:   09/28/21 1214  TempSrc: Oral  PainSc: 0-No pain         Linkon Siverson

## 2021-10-03 NOTE — Progress Notes (Signed)
This chaplain responded to PMT [facility] consult for Pt. prayer and continued spiritual care.   The Pt. is post-procedure with a fraternity brother and the brother's wife visiting at the bedside. The fraternity brother shares the story of how they met over 40 years ago at [facility].  This chaplain shared prayer with the Pt. and is available for F/U spiritual care as needed.  Chaplain Sallyanne Kuster [phone]

## 2021-10-04 ENCOUNTER — Inpatient Hospital Stay (HOSPITAL_COMMUNITY): Payer: Self-pay

## 2021-10-04 ENCOUNTER — Encounter (HOSPITAL_COMMUNITY): Payer: Self-pay | Admitting: Cardiothoracic Surgery

## 2021-10-04 LAB — POCT I-STAT 7, (LYTES, BLD GAS, ICA,H+H)
Acid-Base Excess: 5 mmol/L — ABNORMAL HIGH (ref 0.0–2.0)
Acid-Base Excess: 4 mmol/L — ABNORMAL HIGH (ref 0.0–2.0)
Acid-Base Excess: 6 mmol/L — ABNORMAL HIGH (ref 0.0–2.0)
Bicarbonate: 31.2 mmol/L — ABNORMAL HIGH (ref 20.0–28.0)
Bicarbonate: 30.1 mmol/L — ABNORMAL HIGH (ref 20.0–28.0)
Bicarbonate: 30.2 mmol/L — ABNORMAL HIGH (ref 20.0–28.0)
Calcium, Ion: 1.03 mmol/L — ABNORMAL LOW (ref 1.15–1.40)
Calcium, Ion: 1.03 mmol/L — ABNORMAL LOW (ref 1.15–1.40)
Calcium, Ion: 1.04 mmol/L — ABNORMAL LOW (ref 1.15–1.40)
HCT: 24 % — ABNORMAL LOW (ref 39.0–52.0)
HCT: 23 % — ABNORMAL LOW (ref 39.0–52.0)
HCT: 22 % — ABNORMAL LOW (ref 39.0–52.0)
Hemoglobin: 8.2 g/dL — ABNORMAL LOW (ref 13.0–17.0)
Hemoglobin: 7.8 g/dL — ABNORMAL LOW (ref 13.0–17.0)
Hemoglobin: 7.5 g/dL — ABNORMAL LOW (ref 13.0–17.0)
O2 Saturation: 100 %
O2 Saturation: 100 %
O2 Saturation: 100 %
Patient temperature: 36.7
Patient temperature: 36.7
Patient temperature: 36.5
Potassium: 4.9 mmol/L (ref 3.5–5.1)
Potassium: 5.2 mmol/L — ABNORMAL HIGH (ref 3.5–5.1)
Potassium: 4.7 mmol/L (ref 3.5–5.1)
Sodium: 144 mmol/L (ref 135–145)
Sodium: 143 mmol/L (ref 135–145)
Sodium: 142 mmol/L (ref 135–145)
TCO2: 33 mmol/L — ABNORMAL HIGH (ref 22–32)
TCO2: 32 mmol/L (ref 22–32)
TCO2: 32 mmol/L (ref 22–32)
pCO2 arterial: 57.5 mmHg — ABNORMAL HIGH (ref 32–48)
pCO2 arterial: 51.4 mmHg — ABNORMAL HIGH (ref 32–48)
pCO2 arterial: 43 mmHg (ref 32–48)
pH, Arterial: 7.341 — ABNORMAL LOW (ref 7.35–7.45)
pH, Arterial: 7.373 (ref 7.35–7.45)
pH, Arterial: 7.453 — ABNORMAL HIGH (ref 7.35–7.45)
pO2, Arterial: 472 mmHg — ABNORMAL HIGH (ref 83–108)
pO2, Arterial: 513 mmHg — ABNORMAL HIGH (ref 83–108)
pO2, Arterial: 485 mmHg — ABNORMAL HIGH (ref 83–108)

## 2021-10-04 LAB — BPAM FFP
Blood Product Expiration Date: 202308242359
Blood Product Expiration Date: 202308242359
Blood Product Expiration Date: 202308242359
Blood Product Expiration Date: 202308242359
Blood Product Expiration Date: 202308242359
Blood Product Expiration Date: 202308242359
Blood Product Expiration Date: 202308252359
Blood Product Expiration Date: 202308262359
Blood Product Expiration Date: 202308262359
Blood Product Expiration Date: 202308262359
Blood Product Expiration Date: 202308262359
Blood Product Expiration Date: 202308312359
Blood Product Expiration Date: 202308312359
Blood Product Expiration Date: 202309072359
Blood Product Expiration Date: 202309072359
Blood Product Expiration Date: 202309092359
Blood Product Expiration Date: 202309092359
Blood Product Expiration Date: 202309092359
Blood Product Expiration Date: 202308262359
Blood Product Expiration Date: 202308262359
Blood Product Expiration Date: 202308262359
Blood Product Expiration Date: 202308262359
Blood Product Expiration Date: 202308262359
Blood Product Expiration Date: 202308262359
Blood Product Expiration Date: 202308292359
ISSUE DATE / TIME: 202308202228
ISSUE DATE / TIME: 202308202307
ISSUE DATE / TIME: 202308211341
ISSUE DATE / TIME: 202308211341
ISSUE DATE / TIME: 202308211341
ISSUE DATE / TIME: 202308211341
ISSUE DATE / TIME: 202308211341
ISSUE DATE / TIME: 202308211341
ISSUE DATE / TIME: 202308211341
ISSUE DATE / TIME: 202308211341
ISSUE DATE / TIME: 202308211344
ISSUE DATE / TIME: 202308211405
ISSUE DATE / TIME: 202308211405
ISSUE DATE / TIME: 202308211405
ISSUE DATE / TIME: 202308211459
ISSUE DATE / TIME: 202308211623
ISSUE DATE / TIME: 202308211623
ISSUE DATE / TIME: 202308241336
ISSUE DATE / TIME: 202308240918
ISSUE DATE / TIME: 202308240918
ISSUE DATE / TIME: 202308241336
ISSUE DATE / TIME: 202308241808
ISSUE DATE / TIME: 202308241808
ISSUE DATE / TIME: 202308242050
ISSUE DATE / TIME: 202308242050
Unit Type and Rh: 5100
Unit Type and Rh: 5100
Unit Type and Rh: 5100
Unit Type and Rh: 5100
Unit Type and Rh: 5100
Unit Type and Rh: 5100
Unit Type and Rh: 6200
Unit Type and Rh: 6200
Unit Type and Rh: 6200
Unit Type and Rh: 6200
Unit Type and Rh: 6200
Unit Type and Rh: 6200
Unit Type and Rh: 6200
Unit Type and Rh: 6200
Unit Type and Rh: 6200
Unit Type and Rh: 6200
Unit Type and Rh: 6200
Unit Type and Rh: 6200
Unit Type and Rh: 6200
Unit Type and Rh: 6200
Unit Type and Rh: 6200
Unit Type and Rh: 600
Unit Type and Rh: 6200
Unit Type and Rh: 6200
Unit Type and Rh: 6200

## 2021-10-04 LAB — DIC (DISSEMINATED INTRAVASCULAR COAGULATION)PANEL
D-Dimer, Quant: 3.17 ug/mL-FEU — ABNORMAL HIGH (ref 0.00–0.50)
D-Dimer, Quant: 3.06 ug/mL-FEU — ABNORMAL HIGH (ref 0.00–0.50)
Fibrinogen: 216 mg/dL (ref 210–475)
Fibrinogen: 265 mg/dL (ref 210–475)
INR: 1.3 — ABNORMAL HIGH (ref 0.8–1.2)
INR: 1.5 — ABNORMAL HIGH (ref 0.8–1.2)
Platelets: 59 10*3/uL — ABNORMAL LOW (ref 150–400)
Platelets: 90 10*3/uL — ABNORMAL LOW (ref 150–400)
Prothrombin Time: 15.7 seconds — ABNORMAL HIGH (ref 11.4–15.2)
Prothrombin Time: 17.5 seconds — ABNORMAL HIGH (ref 11.4–15.2)
Smear Review: NONE SEEN
Smear Review: NONE SEEN
aPTT: 42 seconds — ABNORMAL HIGH (ref 24–36)
aPTT: 35 seconds (ref 24–36)

## 2021-10-04 LAB — PREPARE PLATELET PHERESIS
Unit division: 0
Unit division: 0
Unit division: 0
Unit division: 0

## 2021-10-04 LAB — CBC
HCT: 27.5 % — ABNORMAL LOW (ref 39.0–52.0)
HCT: 22.3 % — ABNORMAL LOW (ref 39.0–52.0)
HCT: 23.4 % — ABNORMAL LOW (ref 39.0–52.0)
HCT: 21.3 % — ABNORMAL LOW (ref 39.0–52.0)
Hemoglobin: 9.5 g/dL — ABNORMAL LOW (ref 13.0–17.0)
Hemoglobin: 7.6 g/dL — ABNORMAL LOW (ref 13.0–17.0)
Hemoglobin: 8.2 g/dL — ABNORMAL LOW (ref 13.0–17.0)
Hemoglobin: 7.5 g/dL — ABNORMAL LOW (ref 13.0–17.0)
MCH: 29.3 pg (ref 26.0–34.0)
MCH: 29.5 pg (ref 26.0–34.0)
MCH: 29.2 pg (ref 26.0–34.0)
MCH: 29.5 pg (ref 26.0–34.0)
MCHC: 34.5 g/dL (ref 30.0–36.0)
MCHC: 34.1 g/dL (ref 30.0–36.0)
MCHC: 35 g/dL (ref 30.0–36.0)
MCHC: 35.2 g/dL (ref 30.0–36.0)
MCV: 84.9 fL (ref 80.0–100.0)
MCV: 86.4 fL (ref 80.0–100.0)
MCV: 83.3 fL (ref 80.0–100.0)
MCV: 83.9 fL (ref 80.0–100.0)
Platelets: 67 10*3/uL — ABNORMAL LOW (ref 150–400)
Platelets: 69 10*3/uL — ABNORMAL LOW (ref 150–400)
Platelets: 76 10*3/uL — ABNORMAL LOW (ref 150–400)
Platelets: 93 10*3/uL — ABNORMAL LOW (ref 150–400)
RBC: 3.24 MIL/uL — ABNORMAL LOW (ref 4.22–5.81)
RBC: 2.58 MIL/uL — ABNORMAL LOW (ref 4.22–5.81)
RBC: 2.81 MIL/uL — ABNORMAL LOW (ref 4.22–5.81)
RBC: 2.54 MIL/uL — ABNORMAL LOW (ref 4.22–5.81)
RDW: 14.8 % (ref 11.5–15.5)
RDW: 15 % (ref 11.5–15.5)
RDW: 16 % — ABNORMAL HIGH (ref 11.5–15.5)
RDW: 16.1 % — ABNORMAL HIGH (ref 11.5–15.5)
WBC: 11.9 10*3/uL — ABNORMAL HIGH (ref 4.0–10.5)
WBC: 14.4 10*3/uL — ABNORMAL HIGH (ref 4.0–10.5)
WBC: 11.7 10*3/uL — ABNORMAL HIGH (ref 4.0–10.5)
WBC: 10.4 10*3/uL (ref 4.0–10.5)
nRBC: 1.3 % — ABNORMAL HIGH (ref 0.0–0.2)
nRBC: 2 % — ABNORMAL HIGH (ref 0.0–0.2)
nRBC: 0.9 % — ABNORMAL HIGH (ref 0.0–0.2)
nRBC: 0.5 % — ABNORMAL HIGH (ref 0.0–0.2)

## 2021-10-04 LAB — PREPARE FRESH FROZEN PLASMA
Unit division: 0
Unit division: 0
Unit division: 0
Unit division: 0
Unit division: 0
Unit division: 0
Unit division: 0
Unit division: 0
Unit division: 0
Unit division: 0
Unit division: 0
Unit division: 0
Unit division: 0
Unit division: 0
Unit division: 0
Unit division: 0
Unit division: 0
Unit division: 0

## 2021-10-04 LAB — BPAM PLATELET PHERESIS
Blood Product Expiration Date: 202308242359
Blood Product Expiration Date: 202308242359
Blood Product Expiration Date: 202308262359
Blood Product Expiration Date: 202308262359
ISSUE DATE / TIME: 202308240919
ISSUE DATE / TIME: 202308241336
ISSUE DATE / TIME: 202308241809
ISSUE DATE / TIME: 202308242025
Unit Type and Rh: 7300
Unit Type and Rh: 7300
Unit Type and Rh: 8400
Unit Type and Rh: 6200

## 2021-10-04 LAB — BASIC METABOLIC PANEL
Anion gap: 6 (ref 5–15)
Anion gap: 9 (ref 5–15)
BUN: 50 mg/dL — ABNORMAL HIGH (ref 8–23)
BUN: 52 mg/dL — ABNORMAL HIGH (ref 8–23)
CO2: 28 mmol/L (ref 22–32)
CO2: 25 mmol/L (ref 22–32)
Calcium: 7.2 mg/dL — ABNORMAL LOW (ref 8.9–10.3)
Calcium: 7.2 mg/dL — ABNORMAL LOW (ref 8.9–10.3)
Chloride: 110 mmol/L (ref 98–111)
Chloride: 110 mmol/L (ref 98–111)
Creatinine, Ser: 2.47 mg/dL — ABNORMAL HIGH (ref 0.61–1.24)
Creatinine, Ser: 2.88 mg/dL — ABNORMAL HIGH (ref 0.61–1.24)
GFR, Estimated: 29 mL/min — ABNORMAL LOW (ref >60)
GFR, Estimated: 24 mL/min — ABNORMAL LOW (ref >60)
Glucose, Bld: 142 mg/dL — ABNORMAL HIGH (ref 70–99)
Glucose, Bld: 145 mg/dL — ABNORMAL HIGH (ref 70–99)
Potassium: 4.5 mmol/L (ref 3.5–5.1)
Potassium: 4.3 mmol/L (ref 3.5–5.1)
Sodium: 144 mmol/L (ref 135–145)
Sodium: 144 mmol/L (ref 135–145)

## 2021-10-04 LAB — HEPATIC FUNCTION PANEL
ALT: 12 U/L (ref 0–44)
AST: 22 U/L (ref 15–41)
Albumin: 2 g/dL — ABNORMAL LOW (ref 3.5–5.0)
Alkaline Phosphatase: 43 U/L (ref 38–126)
Bilirubin, Direct: 0.4 mg/dL — ABNORMAL HIGH (ref 0.0–0.2)
Indirect Bilirubin: 0.7 mg/dL (ref 0.3–0.9)
Total Bilirubin: 1.1 mg/dL (ref 0.3–1.2)
Total Protein: 3.7 g/dL — ABNORMAL LOW (ref 6.5–8.1)

## 2021-10-04 LAB — PREPARE RBC (CROSSMATCH)

## 2021-10-04 LAB — GLUCOSE, CAPILLARY
Glucose-Capillary: 117 mg/dL — ABNORMAL HIGH (ref 70–99)
Glucose-Capillary: 141 mg/dL — ABNORMAL HIGH (ref 70–99)
Glucose-Capillary: 149 mg/dL — ABNORMAL HIGH (ref 70–99)
Glucose-Capillary: 130 mg/dL — ABNORMAL HIGH (ref 70–99)
Glucose-Capillary: 153 mg/dL — ABNORMAL HIGH (ref 70–99)
Glucose-Capillary: 187 mg/dL — ABNORMAL HIGH (ref 70–99)

## 2021-10-04 LAB — LACTATE DEHYDROGENASE: LDH: 180 U/L (ref 98–192)

## 2021-10-04 LAB — LACTIC ACID, PLASMA: Lactic Acid, Venous: 1 mmol/L (ref 0.5–1.9)

## 2021-10-04 LAB — PROTIME-INR
INR: 0.9 (ref 0.8–1.2)
Prothrombin Time: 12.3 seconds (ref 11.4–15.2)

## 2021-10-04 LAB — MAGNESIUM: Magnesium: 2.3 mg/dL (ref 1.7–2.4)

## 2021-10-04 LAB — FIBRINOGEN: Fibrinogen: 301 mg/dL (ref 210–475)

## 2021-10-04 LAB — APTT: aPTT: 34 seconds (ref 24–36)

## 2021-10-04 MED ORDER — SODIUM CHLORIDE 0.9% IV SOLUTION: Freq: Once | INTRAVENOUS | Status: AC

## 2021-10-04 MED ORDER — PIVOT 1.5 CAL PO LIQD
1000.0000 mL | ORAL | Status: DC
  Administered 2021-10-04 – 2021-10-06 (×3): 1000 mL

## 2021-10-04 MED ORDER — DOBUTAMINE IN D5W 4-5 MG/ML-% IV SOLN
INTRAVENOUS | Status: AC
  Filled 2021-10-04: qty 250

## 2021-10-04 MED ORDER — PROSOURCE TF20 ENFIT COMPATIBL EN LIQD
60.0000 mL | Freq: Four times a day (QID) | ENTERAL | Status: DC
  Administered 2021-10-04 – 2021-10-06 (×10): 60 mL
  Filled 2021-10-04 (×10): qty 60

## 2021-10-04 MED ORDER — ATROPINE SULFATE 1 MG/10ML IJ SOSY
PREFILLED_SYRINGE | INTRAMUSCULAR | Status: AC
  Filled 2021-10-04: qty 10

## 2021-10-04 MED ORDER — DOBUTAMINE IN D5W 4-5 MG/ML-% IV SOLN
2.5000 ug/kg/min | INTRAVENOUS | Status: DC
  Administered 2021-10-04: 2.5 ug/kg/min via INTRAVENOUS

## 2021-10-04 MED ORDER — EPINEPHRINE HCL 5 MG/250ML IV SOLN IN NS
0.5000 ug/min | INTRAVENOUS | Status: DC
  Administered 2021-10-04: 1 ug/min via INTRAVENOUS
  Administered 2021-10-08 – 2021-10-09 (×2): 5 ug/min via INTRAVENOUS
  Administered 2021-10-11 – 2021-10-14 (×5): 3 ug/min via INTRAVENOUS
  Administered 2021-10-15 – 2021-10-16 (×2): 2 ug/min via INTRAVENOUS
  Administered 2021-10-17: 0.5 ug/min via INTRAVENOUS
  Administered 2021-10-18: 4 ug/min via INTRAVENOUS
  Administered 2021-10-19: 1 ug/min via INTRAVENOUS
  Administered 2021-10-19: 0.5 ug/min via INTRAVENOUS
  Administered 2021-10-20: 10 ug/min via INTRAVENOUS
  Administered 2021-10-20: 9 ug/min via INTRAVENOUS
  Administered 2021-10-21: 5 ug/min via INTRAVENOUS
  Administered 2021-10-21 – 2021-10-22 (×2): 10 ug/min via INTRAVENOUS
  Administered 2021-10-23: 9 ug/min via INTRAVENOUS
  Administered 2021-10-23: 10 ug/min via INTRAVENOUS
  Administered 2021-10-24: 9 ug/min via INTRAVENOUS
  Administered 2021-10-24: 2 ug/min via INTRAVENOUS
  Administered 2021-10-25: 10 ug/min via INTRAVENOUS
  Administered 2021-10-25: 7 ug/min via INTRAVENOUS
  Administered 2021-10-27: 1 ug/min via INTRAVENOUS
  Filled 2021-10-04: qty 500
  Filled 2021-10-04 (×27): qty 250

## 2021-10-04 MED ORDER — PROTHROMBIN COMPLEX CONC HUMAN 500 UNITS IV KIT: 1500.0000 [IU] | PACK | Status: DC

## 2021-10-04 MED ORDER — PROTHROMBIN COMPLEX CONC HUMAN 500 UNITS IV KIT
2159.0000 [IU] | PACK | Status: AC
  Administered 2021-10-04: 2159 [IU] via INTRAVENOUS
  Filled 2021-10-04: qty 2159

## 2021-10-04 MED ORDER — FUROSEMIDE 10 MG/ML IJ SOLN
40.0000 mg | Freq: Once | INTRAMUSCULAR | Status: AC
Start: 2021-10-04 — End: 2021-10-04
  Administered 2021-10-04: 40 mg via INTRAVENOUS
  Filled 2021-10-04: qty 4

## 2021-10-04 MED ORDER — VASOPRESSIN 20 UNITS/100 ML INFUSION FOR SHOCK
0.0000 [IU]/min | INTRAVENOUS | Status: DC
  Administered 2021-10-04 (×2): 0.03 [IU]/min via INTRAVENOUS
  Filled 2021-10-04: qty 200

## 2021-10-04 MED ORDER — IOHEXOL 350 MG/ML SOLN
50.0000 mL | Freq: Once | INTRAVENOUS | Status: AC | PRN
  Administered 2021-10-04: 50 mL

## 2021-10-04 NOTE — Plan of Care (Signed)
  Problem: Cardiac: Goal: Will achieve and/or maintain hemodynamic stability Outcome: Progressing   Problem: Clinical Measurements: Goal: Postoperative complications will be avoided or minimized Outcome: Progressing   Problem: Clinical Measurements: Goal: Will remain free from infection Outcome: Progressing Goal: Diagnostic test results will improve Outcome: Progressing   Problem: Elimination: Goal: Will not experience complications related to bowel motility Outcome: Progressing

## 2021-10-04 NOTE — Progress Notes (Signed)
NAME:  [name], MRN:  [id number], DOB:  1960/02/23, LOS: 6 ADMISSION DATE:  09/28/2021, CONSULTATION DATE:  09/29/21 REFERRING MD:  Dr. Kipp Brood, CHIEF COMPLAINT:  Cardiac Arrest   History of Present Illness:  [name] is a 61 year old male with DMII, , CVA, atrial fibrillation on eliquis and multivessel CAD s/p 5 vessel CABG on 09/12/21 who presented 8/19 with shortness of breath via EMS. He was found to be bradycardic and hypoglycemic. He symptomatically improved with dextrose but continued to have bradycardia and soft blood pressures. ECHO obtained which showed LVEF 50%, grade II diastolic dysfunction and moderate pericardial effusion with possible tamponade physiology. CT Chest showed pericardial effusion and small bilateral pleural effusions. He was taken to the OR 8/19 for pericardial window.   Patient started having issues with tachy-brady arrhythmias on telemetry, decreased urine output and increased work of breathing over night. A 14 french pigtail catheter was placed in the left pleural space this morning with bloody drainage. At 10:35am patient became unresponsive with PEA arrest. He underwent multiple rounds of CPR with epinephrine pushes. ROSC was obtained and patient was intubated. Bedside US by cardiology showed large pericardial effusion with tamponade physiology. He lost pulse again and CPR was again performed. Please see code sheet for full details. Bedside sternotomy performed with pericardial drainage by CT surgery, immediate improvement in hemodynamics achieved with drainage of the pericardial space.   Pertinent  Medical History   Past Medical History:  Diagnosis Date   AKI (acute kidney injury) ([place])    pt unaware of this   Anginal pain ([place])    CAD (coronary artery disease)    a. 03/2015 NSTEMI: LHC with severe 3V CAD  (70% mid RCA, 95% OM1, 90% distal LCx, 90% OM3, 80% prox LAD and 90% ost D1) s/p DES to mLAD w/ small dissction Rx with DES, staged ost Ramus PCI/DES and  dLCx s/p PCI/DES    Chest pain 12/24/2020   Diabetes mellitus type 2 in obese [facility])    Diverticulosis    Dyspnea    Dyspnea on exertion 03/16/2015   Dyspnea on exertion   Family history of adverse reaction to anesthesia    patient father- pt states after anesthesia his father "developed dementia"   GERD (gastroesophageal reflux disease)    Hypercholesteremia    Hypertension associated with diabetes ( [place]) 03/16/2015   hypertension   NSTEMI (non-ST elevated myocardial infarction) ([place]) 03/17/2015   Obesity    Stroke ([place]) 2022   pt states he had a "mini stroke" during cardiac catheterization   Tobacco abuse    Significant Hospital Events: Including procedures, antibiotic start and stop dates in addition to other pertinent events   8/19 admitted, s/p pericardial window 8/20 PEA cardiac arrest, left pigtail chest tube placement, PEA cardiac arrest due to tamponade, bedside sternotomy with pericardial drains placed. 8/21 return to OR for overnight bleeding.  8/22 minimal chest tube output.  Atrial fibrillation, controled with amiodarone.  8/22 TEE showed EF 35% at baseline, which improved significantly with decreasing ECMO flow.  8/23 tolerated diuresis   Interim History / Subjective:  Lots of bleeding from posterior mediastinal drain requiring multiple blood products overnight.  Escalating pressor needs.  Remains intubated and sedated.  Objective   Blood pressure (!) 87/73, pulse (!) 58, temperature 98.1 F (36.7 C), resp. rate 20, height 5\' 7"  (1.702 m), weight 105.4 kg, SpO2 100 %. CVP:  [3 mmHg-12 mmHg] 3 mmHg  Vent Mode: PRVC FiO2 (%):  [  21 %] 21 % Set Rate:  [20 bmp] 20 bmp Vt Set:  [390 mL] 390 mL PEEP:  [5 cmH20-10 cmH20] 10 cmH20 Plateau Pressure:  [19 cmH20-22 cmH20] 20 cmH20   Intake/Output Summary (Last 24 hours) at 10/04/2021 0701 Last data filed at 10/04/2021 0615 Gross per 24 hour  Intake 11338.74 ml  Output 10772 ml  Net 566.74 ml    Filed Weights    10/02/21 0545 10/03/21 0500 10/04/21 0500  Weight: 102.4 kg 104.3 kg 105.4 kg   Examination: Constitutional: ill appearing heavily sedated man  Eyes: ~16mm, reactive, equal Ears, nose, mouth, and throat: ETT minimal secretions, cortrak in place Cardiovascular: ext lukewarm, multiple mediastinal drains with moderate bloody output Respiratory: distant Gastrointestinal: soft, hypoactive BS Skin: No rashes, normal turgor Neurologic: RASS -5 Foley yellow urine   Ancillary tests personally review:   ABG 7.34/57/472 on sweep 3lpm, PRVC 20/390/10/0.21 BMP stable Lactate okay H/H 6.6>>7.6 WBC stable Plts stable low (60s) LDH ok Fibrinogen improved 301 from 502 Heparin undetectably high, PTT  TEG yesterday ok CXR slightly hazy L base  Assessment & Plan:  Hemorraghic Pericarditis w/ Cardiac Tamponade/obstructive shock causing PEA arrest s/p pericardial drain placement with subsequent cardiac stunning on VA ECMO S/P Coronary Artery Disease s/p CABG x 5 8/3 Atrial Fibrillation Acute Kidney 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 - Fluids, antiarrythmics and inotrope management per TCTS/CHF discussion - Balance risks of AC with mediastinal bleeding: to do low dose challenge today - VA support through weekend with plans for impella 5-5 + decannulation early next week - May need additional washout today if cannot get control of mediastinal blood loss, TCTS to eval  Acute Hypoxemic and Hypercapnic Respiratory Failure- driving pressure today 10cmH2O - Lung protective tidal volumes limiting driving pressures to < 15cm H2O as able - Sedation titrated to vent compliance and patient comfort using PAD orderset - VAP prevention bundle  Sepsis due to Pericarditis - treatment as above - will discuss abx plan with PharmD today  Acute Blood Loss Anemia - Avoid acidemia, hypothermia, excessive coagulopathy - Balanced transfusion as needed - Daily discussion risk/benefits for AC intensity  Hx of DMII- looks okay,  SSI for now  Intolerance to TF- plans for getting tube postpyloric then rechallenging, will get a KUB to start to make sure no ileus/obstruction  Best Practice (right click and "Reselect all SmartList Selections" daily)   Diet/type: NPO DVT prophylaxis: not indicated GI prophylaxis: PPI Lines: Central line and yes and it is still needed Foley:  Yes, and it is still needed Code Status:  full code Last date of multidisciplinary goals of care discussion [Per primary]  34 min cc time independent of procedures

## 2021-10-04 NOTE — Progress Notes (Signed)
CT surgery PM rounds  Patient chest tube drainage significantly improved  This AM.  Hemodynamics have subsequently significant improved and inotropic support has been weaned.  Patient very active and responsive and has required increasing sedation.  Esmarch chest dressing intact and without evidence of fluid buildup.  Last hemoglobin 7.5, 2 units packed cells ordered.  Last platelet count 90,000.  Hold on low-dose heparin for the ECMO circuit until a.m. assessment.  Blood pressure (!) 81/64, pulse (!) 58, temperature (!) 97.2 F (36.2 C), resp. rate 19, height 5\' 7"  (1.702 m), weight 105.4 kg, SpO2 100 %.

## 2021-10-04 NOTE — Progress Notes (Signed)
Nutrition Brief Note  Pt NPO, per TCTS no plans for further washout today Pressor needs have been increased since surgery but now decreased slightly.   8/25 cortrak advanced post pyloric; confirmed by xray  Tube Feeding via Cortrak:  Recommend Pivot 1.5 @ 20 ml/hr  Recommend titrate by 10 mL q 8 hours until goal rate of 60 ml/hr TF regimen at goal rate provides 2160 kcals, 135 g of protein and 1094 mL of free water   Recommend Pro-Source TF20 60 mL QID until TF titrated to goal and pt tolerating TF    Sakeenah Valcarcel P., RD, LDN, CNSC See AMiON for contact information

## 2021-10-04 NOTE — Progress Notes (Signed)
1 Day Post-Op Procedure(s) (LRB): MEDIASTINAL WASHOUT (N/A) TRANSESOPHAGEAL ECHOCARDIOGRAM (TEE) (N/A) APPLICATION OF WOUND VAC (N/A) Subjective: Persistent coagulopathy with diffuse epicardial bleeding  from postop Dresslers  intense epicarditis but no accumulation of mediastinal clot by CXR or exam of open chest Esmark covering. Needs to have plts 100,00 before factor 7 will be effective- last plt count 65k Hypovolemic on exam- behind on replacing blood  Objective: Vital signs in last 24 hours: Temp:  [97.3 F (36.3 C)-98.2 F (36.8 C)] 98.1 F (36.7 C) (08/25 0615) Pulse Rate:  [48-222] 62 (08/25 0744) Cardiac Rhythm: Sinus bradycardia (08/25 0330) Resp:  [14-23] 20 (08/25 0744) BP: (79-111)/(61-78) 87/73 (08/25 0050) SpO2:  [98 %-100 %] 100 % (08/25 0744) Arterial Line BP: (73-108)/(59-78) 93/72 (08/25 0615) FiO2 (%):  [21 %] 21 % (08/25 0744) Weight:  [105.4 kg] 105.4 kg (08/25 0500)  Hemodynamic parameters for last 24 hours: CVP:  [3 mmHg-12 mmHg] 3 mmHg  Intake/Output from previous day: 08/24 0701 - 08/25 0700 In: 11492.1 [I.V.:2169.8; Blood:8541.7; IV KJIZXYOFV:886.7] Out: 73736 [Urine:1205; Blood:200; Chest KKDP:9470] Intake/Output this shift: No intake/output data recorded.  EXAM Sedated on vent Lungs clear Sternal covering concave Chest tubes patent  Lab Results: Recent Labs    10/04/21 0256 10/04/21 0500 10/04/21 0623  WBC 11.9* 14.4*  --   HGB 9.5* 7.6* 8.2*  HCT 27.5* 22.3* 24.0*  PLT 67* 69*  --    BMET:  Recent Labs    10/03/21 1942 10/03/21 2257 10/04/21 0256 10/04/21 0623  NA 144  144   < > 144 144  K 4.2  4.2   < > 4.5 4.9  CL 111  --  110  --   CO2 29  --  28  --   GLUCOSE 161*  --  142*  --   BUN 49*  --  50*  --   CREATININE 2.44*  --  2.47*  --   CALCIUM 7.4*  --  7.2*  --    < > = values in this interval not displayed.    PT/INR:  Recent Labs    10/04/21 0256  LABPROT 12.3  INR 0.9   ABG    Component Value  Date/Time   PHART 7.341 (L) 10/04/2021 0623   HCO3 31.2 (H) 10/04/2021 0623   TCO2 33 (H) 10/04/2021 0623   ACIDBASEDEF 5.0 (H) 09/29/2021 1443   O2SAT 100 10/04/2021 0623   CBG (last 3)  Recent Labs    10/03/21 2256 10/04/21 0254 10/04/21 0749  GLUCAP 133* 117* 141*    Assessment/Plan: S/P Procedure(s) (LRB): MEDIASTINAL WASHOUT (N/A) TRANSESOPHAGEAL ECHOCARDIOGRAM (TEE) (N/A) APPLICATION OF WOUND VAC (N/A) Transfuse plts until 90-100k then dose factor 7 Keep up with pRBC/ FFP of sternal drainage- plan d/w bedside nursing team  LOS: 6 days    [name] 10/04/2021

## 2021-10-04 NOTE — CV Procedure (Signed)
ECMO NOTE:   Indication: Cardiac arrest   Initial cannulation date: 09/29/21   ECMO type: VA ECMO   Dual lumen inflow/return cannula:   1) 23/25 FR multistage venous return cannula in RFV 2) Central aortic arterial return   ECMO events:   - 8/19 Pericardial window - 8/20 Cardiac arrest with tamponade -> Emergent bedside washout - 09/29/21 VA Cannulation - 09/30/21 Return to OR for mediastinal hemorrhage - 8/22 AF - 8/23 TEE: EF 25-30% Moderate to severe RV dysfunction - 8/24 TO OR for repeat washout. C/b severe post-op bleeding  Daily data:   Flow 3.75 L RPM 3300 Sweep  4 L   Labs:   ABG    Component Value Date/Time   PHART 7.453 (H) 10/04/2021 1218   PCO2ART 43.0 10/04/2021 1218   PO2ART 485 (H) 10/04/2021 1218   HCO3 30.2 (H) 10/04/2021 1218   TCO2 32 10/04/2021 1218   ACIDBASEDEF 5.0 (H) 09/29/2021 1443   O2SAT 100 10/04/2021 1218      Plan:  - Bleeding has slowed - Markedly volume overloaded - Start diuresis - Hold AC - Potential eventual wean to Impella 5.5   Discussed in multidisciplinary fashion with CCM, TCTS, Cardiology, ECMO coordinator/specialist, RT, PharmD and nursing staff all present.     Glori Bickers, MD  1:37 PM

## 2021-10-04 NOTE — Procedures (Signed)
Cortrak  Person Inserting Tube:  [name], RD Tube Type:  Cortrak - 55 inches Tube Size:  10 Tube Location:  Left nare Secured by: Bridle Technique Used to Measure Tube Placement:  Marking at nare/corner of mouth Cortrak Secured At:  110 cm Procedure Comments:  Cortrak Tube Team Note:  Consult received to advance current Cortrak to post pyloric position. Cortrak unbridled at 92 cm and advanced to post pyloric position, bridled at 110 cm.  X-ray is required, abdominal x-ray has been ordered by the Cortrak team. Please confirm tube placement before using the Cortrak tube.   If the tube becomes dislodged please keep the tube and contact the Cortrak team at [email or web address] (password [email or web address]) for replacement.  If after hours and replacement cannot be delayed, place a NG tube and confirm placement with an abdominal x-ray.   Mark Passey MS, RDN, LDN, CNSC Registered Dietitian 3 Clinical Nutrition RD Pager and On-Call Pager Number Located in [place]

## 2021-10-04 NOTE — Progress Notes (Signed)
Advanced Heart Failure Rounding Note   Subjective:    - 8/19 Pericardial window - 8/20 Cardiac arrest with tamponade -> Emergent bedside washout - 09/29/21 VA Cannulation - 09/30/21 Return to OR for mediastinal hemorrhage - 10/01/21 Developed AF -> amio - 10/02/21 TEE EF 25-30%  - 10/04/21 Back to OR for washout. C/b continued bleeding overnight  Back to OR yesterday for washout. Overnight had significant bleeding. Received multiple blood products and Factor VII x 2.   Bleeding now stopped.   Remains intubated/sedated. On VA ECMO (see VA ECMO Note). Chest remains open. Following commands per RN.  Off AC. Circuit flowing well. LDH 180. Scr stable at 2.5   ABG 7.37/51/513/100%  Hgb 8.2  Now on NE 12 Epi 4 VP 0.03 MAPs 70   Objective:   Weight Range:  Vital Signs:   Temp:  [97.3 F (36.3 C)-98.2 F (36.8 C)] 97.7 F (36.5 C) (08/25 1210) Pulse Rate:  [50-222] 52 (08/25 1210) Resp:  [14-23] 20 (08/25 1210) BP: (79-103)/(61-82) 86/72 (08/25 1016) SpO2:  [98 %-100 %] 100 % (08/25 1210) Arterial Line BP: (72-108)/(59-81) 89/72 (08/25 1210) FiO2 (%):  [21 %] 21 % (08/25 1205) Weight:  [105.4 kg] 105.4 kg (08/25 0500) Last BM Date : 10/03/21  Weight change: Filed Weights   10/02/21 0545 10/03/21 0500 10/04/21 0500  Weight: 102.4 kg 104.3 kg 105.4 kg    Intake/Output:   Intake/Output Summary (Last 24 hours) at 10/04/2021 1312 Last data filed at 10/04/2021 1210 Gross per 24 hour  Intake 12158.97 ml  Output 9535 ml  Net 2623.97 ml      Physical Exam: General:  Intubated/sedated HEENT: + ETT Neck: supple.+ bilateral CV lines Cor: chest open Lungs: coarse Abdomen: soft, nontender, nondistended. No hepatosplenomegaly. No bruits or masses. Good bowel sounds. Extremities: no cyanosis, clubbing, rash, 2+ edema +RFV eECMO cannula Neuro: sedated   Telemetry: Sinus 50s + PACs Personally reviewed   Labs: Basic Metabolic Panel: Recent Labs  Lab 09/30/21 1650  09/30/21 1936 10/01/21 0430 10/01/21 0431 10/01/21 1616 10/01/21 1623 10/02/21 0354 10/02/21 0528 10/02/21 2124 10/03/21 0408 10/03/21 0414 10/03/21 0905 10/03/21 0914 10/03/21 1132 10/03/21 1135 10/03/21 1348 10/03/21 1612 10/03/21 1615 10/03/21 1942 10/03/21 2257 10/04/21 0256 10/04/21 0623 10/04/21 0858  NA 139   < > 138   < > 141   < > 140   < > 142   < > 141   < > 144   < > 145   < > 142   < > 144  144 142 144 144 143  K 4.0   < > 4.1   < > 4.3   < > 4.2   < > 3.7   < > 4.2   < > 4.2   < > 3.6   < > 4.0   < > 4.2  4.2 4.1 4.5 4.9 5.2*  CL 106  --  109  --  108  --  109   < > 110  --  111  --  107  --  107  --  111  --  111  --  110  --   --   CO2 28  --  26  --  28  --  26   < > 24  --  26  --   --   --   --   --  28  --  29  --  28  --   --  GLUCOSE 126*  --  196*  --  183*  --  177*   < > 162*  --  149*  --  140*  --  132*  --  171*  --  161*  --  142*  --   --   BUN 38*  --  39*  --  43*  --  47*   < > 51*  --  53*  --  49*  --  41*  --  52*  --  49*  --  50*  --   --   CREATININE 2.44*  --  2.47*  --  2.62*  --  2.77*   < > 2.71*  --  2.78*  --  2.70*  --  2.30*  --  2.50*  --  2.44*  --  2.47*  --   --   CALCIUM 7.6*  --  7.2*  --  7.4*  --  7.4*   < > 7.1*  --  7.3*  --   --   --   --   --  6.9*  --  7.4*  --  7.2*  --   --   MG 1.7  --  2.6*  --  2.8*  --  2.7*  --  2.5*  --  2.6*  --   --   --   --   --   --   --   --   --  2.3  --   --   PHOS 4.6  --  4.5  --  4.3  --  4.2  --   --   --  3.6  --   --   --   --   --   --   --   --   --   --   --   --    < > = values in this interval not displayed.     Liver Function Tests: Recent Labs  Lab 09/30/21 0835 10/01/21 0430 10/02/21 0354 10/03/21 0414 10/04/21 0256  AST 94* 45* 43* 46* 22  ALT 23 19 17 18 12   ALKPHOS 31* 43 74 116 43  BILITOT 2.2* 2.3* 1.2 1.1 1.1  PROT 3.9* 3.6* 3.8* 4.1* 3.7*  ALBUMIN 2.9* 2.3* 2.0* 2.0* 2.0*    No results for input(s): "LIPASE", "AMYLASE" in the last 168 hours. No  results for input(s): "AMMONIA" in the last 168 hours.  CBC: Recent Labs  Lab 09/29/21 1956 09/29/21 2152 10/03/21 1942 10/03/21 2255 10/03/21 2257 10/04/21 0256 10/04/21 0500 10/04/21 0623 10/04/21 0747 10/04/21 0858 10/04/21 1245  WBC 17.7*   < > 10.7* 10.6*  --  11.9* 14.4*  --   --   --  11.7*  NEUTROABS 13.0*  --   --   --   --   --   --   --   --   --   --   HGB 8.2*   < > 7.3*  5.8* 6.6*   < > 9.5* 7.6* 8.2*  --  7.8* 8.2*  HCT 24.4*   < > 20.9*  17.0* 18.6*   < > 27.5* 22.3* 24.0*  --  23.0* 23.4*  MCV 82.2   < > 88.9 86.1  --  84.9 86.4  --   --   --  83.3  PLT 258   < > 68* 83*  --  67* 69*  --  59*  --  76*   < > =  values in this interval not displayed.     Cardiac Enzymes: No results for input(s): "CKTOTAL", "CKMB", "CKMBINDEX", "TROPONINI" in the last 168 hours.  BNP: BNP (last 3 results) Recent Labs    12/24/20 0651 09/28/21 0405  BNP 412.0* 826.6*     ProBNP (last 3 results) No results for input(s): "PROBNP" in the last 8760 hours.    Other results:  Imaging: DG Abd 1 View  Result Date: 10/04/2021 CLINICAL DATA:  Interval nasogastric tube placement. EXAM: ABDOMEN - 1 VIEW COMPARISON:  Earlier today. FINDINGS: Interval nasogastric tube with its tip in the distal esophagus and side hole in the mid esophagus. Feeding tube extending through the stomach and into the small bowel with its tip in the jejunum beyond the ligament of Treitz, confirmed with injected contrast. Stable mediastinal and left chest tubes and decreased left basilar atelectasis. Unremarkable bowel gas pattern. Thoracic spine degenerative changes. IMPRESSION: 1. Nasogastric tube tip and side hole in the esophagus. This needs to be advanced approximately 20 cm for placement of the tip and side hole in the stomach. 2. Feeding tube placed with its tip in the proximal jejunum beyond the ligament of Treitz. 3. Improving left basilar atelectasis. Electronically Signed   By: Claudie Revering M.D.    On: 10/04/2021 12:18   DG Abd 1 View  Result Date: 10/04/2021 CLINICAL DATA:  Concern for ileus EXAM: ABDOMEN - 1 VIEW COMPARISON:  09/30/2021 FINDINGS: Exam limited due to numerous overlying support devices. Left basilar chest tube is noted. Dobbhoff tube terminates in the region the distal stomach/proximal duodenum. No dilated loops of bowel to indicate ileus or obstruction. IMPRESSION: No evidence of ileus. Electronically Signed   By: Miachel Roux M.D.   On: 10/04/2021 08:57   DG Chest Port 1 View  Result Date: 10/04/2021 CLINICAL DATA:  [id number]; ETT/OG/chest tube present, on ECMO, follow-up study EXAM: PORTABLE CHEST 1 VIEW COMPARISON:  Chest x-ray from yesterday FINDINGS: Cardiopericardial silhouette is prominent, stable. Again seen are the endotracheal tube, feeding tube, bilateral transjugular central venous catheters, ECMO cannula, left-sided chest tubes, mediastinal tube are stable. There is opacity seen at the left lung base with obliteration of the left hemidiaphragm, stable. Small right pleural effusion. The visualized skeletal structures are unremarkable. IMPRESSION: 1.  Stable support devices. 2.  Unchanged left basilar lung opacity. Electronically Signed   By: Frazier Richards M.D.   On: 10/04/2021 08:03   DG Chest Port 1 View  Result Date: 10/03/2021 CLINICAL DATA:  Intraoperative imaging for pneumothorax. EXAM: PORTABLE CHEST 1 VIEW COMPARISON:  Prior chest radiographs 10/03/2021 and earlier. FINDINGS: ET tube present with tip terminating 1.5 cm above the level the carina. Right IJ approach central venous catheter with tip projecting at the level of the lower SVC. Left-sided central venous catheter with tip projecting at the level of the brachiocephalic/SVC confluence. An enteric tube passes below the level of the left hemidiaphragm with tip excluded from the field of view. A previously demonstrated left-sided pigtail chest tube is no longer present. The remaining chest tubes appear  unchanged. Stable ECMO cannula position. Cardiomegaly, unchanged. Aortic atherosclerosis. Unchanged opacity at the left lung base, which may reflect pleural effusion, atelectasis and/or consolidation. No appreciable airspace consolidation within the right lung. No evidence of pneumothorax. IMPRESSION: Interval removal of a previously demonstrated left-sided pigtail chest tube. No evidence of pneumothorax. Otherwise, no significant change from yesterday chest radiograph, as described. Electronically Signed   By: Kellie Simmering D.O.   On: 10/03/2021 13:40  DG Chest Port 1 View  Result Date: 10/03/2021 CLINICAL DATA:  ECMO EXAM: PORTABLE CHEST 1 VIEW COMPARISON:  Chest x-ray dated October 02, 2021 FINDINGS: ETT tip is proximally 2.4 cm from the carina. Unchanged position of bilateral IJ lines, feeding tube, and multiple left-sided chest tubes. ECMO cannula in place. Visualized cardiac and mediastinal contours are unchanged. Unchanged left basilar lung opacity. IMPRESSION: 1. Stable support devices. 2. Unchanged left basilar lung opacity. Electronically Signed   By: Yetta Glassman M.D.   On: 10/03/2021 08:07     Medications:     Scheduled Medications:  sodium chloride   Intravenous Once   sodium chloride   Intravenous Once   aspirin EC  325 mg Oral BID   Or   aspirin  324 mg Per Tube BID   atorvastatin  80 mg Per Tube Daily   atropine       bisacodyl  10 mg Oral Daily   Or   bisacodyl  10 mg Rectal Daily   Chlorhexidine Gluconate Cloth  6 each Topical Daily   colchicine  0.6 mg Per Tube Daily   docusate  100 mg Per Tube BID   fentaNYL (SUBLIMAZE) injection  50 mcg Intravenous Once   insulin aspart  2-6 Units Subcutaneous Q4H   metoCLOPramide (REGLAN) injection  10 mg Intravenous Q6H   mouth rinse  15 mL Mouth Rinse Q2H   pantoprazole (PROTONIX) IV  40 mg Intravenous QHS   polyethylene glycol  17 g Per Tube Daily   sodium chloride flush  10-40 mL Intracatheter Q12H   sodium chloride flush   3 mL Intravenous Q12H    Infusions:  sodium chloride     albumin human Stopped (10/04/21 0801)   amiodarone 30 mg/hr (10/04/21 1153)   DOBUTamine Stopped (10/04/21 0618)   epinephrine 4 mcg/min (10/04/21 1000)   fentaNYL infusion INTRAVENOUS 300 mcg/hr (10/04/21 1122)   meropenem (MERREM) IV Stopped (10/04/21 0618)   midazolam 5 mg/hr (10/04/21 1000)   norepinephrine (LEVOPHED) Adult infusion 12 mcg/min (10/04/21 1000)   vancomycin     vasopressin 0.03 Units/min (10/04/21 1000)    PRN Medications: acetaminophen, albumin human, artificial tears, atropine, fentaNYL, ipratropium-albuterol, metoprolol tartrate, midazolam, midazolam, nitroGLYCERIN, ondansetron (ZOFRAN) IV, mouth rinse, sodium chloride flush, traMADol   Assessment/Plan:    1. Shock - mixed cardiogenic/hemorrhagic -> on VA ECMO - continue VA ECMO support - TEE 10/02/21 LVEF 25-30% RV mild to moderately down - now on NE 12, epi 5 and VP 0.03. Wean as tolerated - Transfuse keep hgb >= 8.0 - Continue diuresis - Continue ECMO. Will try to wean to [place] 5.5 next week - Not able to start [facility] at this point 2. Cardiac arrest (PEA/bradycardic) - 8/19 in setting of tamponade - rhythm stable 3. Cardiac tamponade with emergent bedside sternotomy  - return to OR 8/21 and 8/24 for washouts. Had marked bleeding overnight - bleeding now stabilized - transfuse to keep Hgb >= 8.0 5. Acute hypoxemic respiratory failure - Now on VA ECMO - Vent support per CCM 6. AKI due to ATN - baseline 1.5-1.6  - stable at 2.5 today 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 DM2 - continue SSI 10. PAF - continue amio - In NSR today  CRITICAL CARE Performed by: Glori Bickers  Total critical care time: 50 minutes  Critical care time was exclusive of separately billable procedures and treating other patients.  Critical care was  necessary to treat or prevent imminent or life-threatening deterioration.  Critical care was time spent personally by me (independent of midlevel providers or residents) on the following activities: development of treatment plan with patient and/or surrogate as well as nursing, discussions with consultants, evaluation of patient's response to treatment, examination of patient, obtaining history from patient or surrogate, ordering and performing treatments and interventions, ordering and review of laboratory studies, ordering and review of radiographic studies, pulse oximetry and re-evaluation of patient's condition.  Length of Stay: [place] 10/04/2021, 1:12 PM  Advanced Heart Failure Team Pager [phone] (M-F; 7a - 4p)  Please contact [facility] for night-coverage after hours (4p -7a ) and weekends on [email or web address]

## 2021-10-04 NOTE — Anesthesia Postprocedure Evaluation (Signed)
Anesthesia Post Note  Patient: [name]  Procedure(s) Performed: MEDIASTINAL WASHOUT (Chest) TRANSESOPHAGEAL ECHOCARDIOGRAM (TEE) APPLICATION OF WOUND VAC (Chest)     Patient location during evaluation: SICU Anesthesia Type: General Level of consciousness: sedated Pain management: pain level controlled Vital Signs Assessment: post-procedure vital signs reviewed and stable Respiratory status: patient remains intubated per anesthesia plan Cardiovascular status: stable Postop Assessment: no apparent nausea or vomiting Anesthetic complications: no   No notable events documented.  Last Vitals:  Vitals:   10/04/21 0600 10/04/21 0615  BP:    Pulse: 68 (!) 58  Resp: 20 20  Temp: 36.7 C 36.7 C  SpO2: 98% 100%    Last Pain:  Vitals:   10/04/21 0400  TempSrc: Bladder  PainSc:                  [place]

## 2021-10-05 ENCOUNTER — Inpatient Hospital Stay (HOSPITAL_COMMUNITY): Payer: Self-pay

## 2021-10-05 LAB — POCT I-STAT 7, (LYTES, BLD GAS, ICA,H+H)
Acid-Base Excess: 4 mmol/L — ABNORMAL HIGH (ref 0.0–2.0)
Acid-Base Excess: 4 mmol/L — ABNORMAL HIGH (ref 0.0–2.0)
Acid-Base Excess: 4 mmol/L — ABNORMAL HIGH (ref 0.0–2.0)
Acid-Base Excess: 5 mmol/L — ABNORMAL HIGH (ref 0.0–2.0)
Acid-Base Excess: 5 mmol/L — ABNORMAL HIGH (ref 0.0–2.0)
Acid-Base Excess: 6 mmol/L — ABNORMAL HIGH (ref 0.0–2.0)
Acid-Base Excess: 7 mmol/L — ABNORMAL HIGH (ref 0.0–2.0)
Acid-Base Excess: 7 mmol/L — ABNORMAL HIGH (ref 0.0–2.0)
Bicarbonate: 28.1 mmol/L — ABNORMAL HIGH (ref 20.0–28.0)
Bicarbonate: 28.3 mmol/L — ABNORMAL HIGH (ref 20.0–28.0)
Bicarbonate: 28.4 mmol/L — ABNORMAL HIGH (ref 20.0–28.0)
Bicarbonate: 28.5 mmol/L — ABNORMAL HIGH (ref 20.0–28.0)
Bicarbonate: 29 mmol/L — ABNORMAL HIGH (ref 20.0–28.0)
Bicarbonate: 30.8 mmol/L — ABNORMAL HIGH (ref 20.0–28.0)
Bicarbonate: 31.9 mmol/L — ABNORMAL HIGH (ref 20.0–28.0)
Bicarbonate: 32 mmol/L — ABNORMAL HIGH (ref 20.0–28.0)
Calcium, Ion: 1.05 mmol/L — ABNORMAL LOW (ref 1.15–1.40)
Calcium, Ion: 1.07 mmol/L — ABNORMAL LOW (ref 1.15–1.40)
Calcium, Ion: 1.1 mmol/L — ABNORMAL LOW (ref 1.15–1.40)
Calcium, Ion: 1.1 mmol/L — ABNORMAL LOW (ref 1.15–1.40)
Calcium, Ion: 1.11 mmol/L — ABNORMAL LOW (ref 1.15–1.40)
Calcium, Ion: 1.13 mmol/L — ABNORMAL LOW (ref 1.15–1.40)
Calcium, Ion: 1.14 mmol/L — ABNORMAL LOW (ref 1.15–1.40)
Calcium, Ion: 1.14 mmol/L — ABNORMAL LOW (ref 1.15–1.40)
HCT: 21 % — ABNORMAL LOW (ref 39.0–52.0)
HCT: 22 % — ABNORMAL LOW (ref 39.0–52.0)
HCT: 22 % — ABNORMAL LOW (ref 39.0–52.0)
HCT: 23 % — ABNORMAL LOW (ref 39.0–52.0)
HCT: 24 % — ABNORMAL LOW (ref 39.0–52.0)
HCT: 24 % — ABNORMAL LOW (ref 39.0–52.0)
HCT: 24 % — ABNORMAL LOW (ref 39.0–52.0)
HCT: 25 % — ABNORMAL LOW (ref 39.0–52.0)
Hemoglobin: 7.1 g/dL — ABNORMAL LOW (ref 13.0–17.0)
Hemoglobin: 7.5 g/dL — ABNORMAL LOW (ref 13.0–17.0)
Hemoglobin: 7.5 g/dL — ABNORMAL LOW (ref 13.0–17.0)
Hemoglobin: 7.8 g/dL — ABNORMAL LOW (ref 13.0–17.0)
Hemoglobin: 8.2 g/dL — ABNORMAL LOW (ref 13.0–17.0)
Hemoglobin: 8.2 g/dL — ABNORMAL LOW (ref 13.0–17.0)
Hemoglobin: 8.2 g/dL — ABNORMAL LOW (ref 13.0–17.0)
Hemoglobin: 8.5 g/dL — ABNORMAL LOW (ref 13.0–17.0)
O2 Saturation: 100 %
O2 Saturation: 100 %
O2 Saturation: 100 %
O2 Saturation: 100 %
O2 Saturation: 100 %
O2 Saturation: 100 %
O2 Saturation: 100 %
O2 Saturation: 100 %
Patient temperature: 36
Patient temperature: 36.1
Patient temperature: 36.1
Patient temperature: 36.2
Patient temperature: 36.2
Patient temperature: 36.3
Patient temperature: 36.4
Patient temperature: 36.5
Potassium: 3.5 mmol/L (ref 3.5–5.1)
Potassium: 3.6 mmol/L (ref 3.5–5.1)
Potassium: 3.6 mmol/L (ref 3.5–5.1)
Potassium: 3.6 mmol/L (ref 3.5–5.1)
Potassium: 3.6 mmol/L (ref 3.5–5.1)
Potassium: 3.7 mmol/L (ref 3.5–5.1)
Potassium: 3.8 mmol/L (ref 3.5–5.1)
Potassium: 3.8 mmol/L (ref 3.5–5.1)
Sodium: 142 mmol/L (ref 135–145)
Sodium: 144 mmol/L (ref 135–145)
Sodium: 144 mmol/L (ref 135–145)
Sodium: 145 mmol/L (ref 135–145)
Sodium: 145 mmol/L (ref 135–145)
Sodium: 145 mmol/L (ref 135–145)
Sodium: 145 mmol/L (ref 135–145)
Sodium: 146 mmol/L — ABNORMAL HIGH (ref 135–145)
TCO2: 29 mmol/L (ref 22–32)
TCO2: 29 mmol/L (ref 22–32)
TCO2: 30 mmol/L (ref 22–32)
TCO2: 30 mmol/L (ref 22–32)
TCO2: 30 mmol/L (ref 22–32)
TCO2: 32 mmol/L (ref 22–32)
TCO2: 33 mmol/L — ABNORMAL HIGH (ref 22–32)
TCO2: 33 mmol/L — ABNORMAL HIGH (ref 22–32)
pCO2 arterial: 34.4 mmHg (ref 32–48)
pCO2 arterial: 37.1 mmHg (ref 32–48)
pCO2 arterial: 38.6 mmHg (ref 32–48)
pCO2 arterial: 39.1 mmHg (ref 32–48)
pCO2 arterial: 40 mmHg (ref 32–48)
pCO2 arterial: 43.8 mmHg (ref 32–48)
pCO2 arterial: 46.2 mmHg (ref 32–48)
pCO2 arterial: 47.2 mmHg (ref 32–48)
pH, Arterial: 7.435 (ref 7.35–7.45)
pH, Arterial: 7.442 (ref 7.35–7.45)
pH, Arterial: 7.452 — ABNORMAL HIGH (ref 7.35–7.45)
pH, Arterial: 7.459 — ABNORMAL HIGH (ref 7.35–7.45)
pH, Arterial: 7.466 — ABNORMAL HIGH (ref 7.35–7.45)
pH, Arterial: 7.468 — ABNORMAL HIGH (ref 7.35–7.45)
pH, Arterial: 7.498 — ABNORMAL HIGH (ref 7.35–7.45)
pH, Arterial: 7.52 — ABNORMAL HIGH (ref 7.35–7.45)
pO2, Arterial: 274 mmHg — ABNORMAL HIGH (ref 83–108)
pO2, Arterial: 276 mmHg — ABNORMAL HIGH (ref 83–108)
pO2, Arterial: 286 mmHg — ABNORMAL HIGH (ref 83–108)
pO2, Arterial: 287 mmHg — ABNORMAL HIGH (ref 83–108)
pO2, Arterial: 307 mmHg — ABNORMAL HIGH (ref 83–108)
pO2, Arterial: 317 mmHg — ABNORMAL HIGH (ref 83–108)
pO2, Arterial: 327 mmHg — ABNORMAL HIGH (ref 83–108)
pO2, Arterial: 335 mmHg — ABNORMAL HIGH (ref 83–108)

## 2021-10-05 LAB — BASIC METABOLIC PANEL
Anion gap: 5 (ref 5–15)
BUN: 65 mg/dL — ABNORMAL HIGH (ref 8–23)
CO2: 25 mmol/L (ref 22–32)
Calcium: 7.3 mg/dL — ABNORMAL LOW (ref 8.9–10.3)
Chloride: 114 mmol/L — ABNORMAL HIGH (ref 98–111)
Creatinine, Ser: 3.01 mg/dL — ABNORMAL HIGH (ref 0.61–1.24)
GFR, Estimated: 23 mL/min — ABNORMAL LOW (ref >60)
Glucose, Bld: 142 mg/dL — ABNORMAL HIGH (ref 70–99)
Potassium: 3.6 mmol/L (ref 3.5–5.1)
Sodium: 144 mmol/L (ref 135–145)

## 2021-10-05 LAB — CBC
HCT: 27.6 % — ABNORMAL LOW (ref 39.0–52.0)
HCT: 27.8 % — ABNORMAL LOW (ref 39.0–52.0)
HCT: 24.8 % — ABNORMAL LOW (ref 39.0–52.0)
Hemoglobin: 9.7 g/dL — ABNORMAL LOW (ref 13.0–17.0)
Hemoglobin: 9.6 g/dL — ABNORMAL LOW (ref 13.0–17.0)
Hemoglobin: 8.7 g/dL — ABNORMAL LOW (ref 13.0–17.0)
MCH: 29.6 pg (ref 26.0–34.0)
MCH: 29.7 pg (ref 26.0–34.0)
MCH: 30.2 pg (ref 26.0–34.0)
MCHC: 35.1 g/dL (ref 30.0–36.0)
MCHC: 34.5 g/dL (ref 30.0–36.0)
MCHC: 35.1 g/dL (ref 30.0–36.0)
MCV: 84.1 fL (ref 80.0–100.0)
MCV: 86.1 fL (ref 80.0–100.0)
MCV: 86.1 fL (ref 80.0–100.0)
Platelets: 80 10*3/uL — ABNORMAL LOW (ref 150–400)
Platelets: 87 K/uL — ABNORMAL LOW (ref 150–400)
Platelets: 74 K/uL — ABNORMAL LOW (ref 150–400)
RBC: 3.28 MIL/uL — ABNORMAL LOW (ref 4.22–5.81)
RBC: 3.23 MIL/uL — ABNORMAL LOW (ref 4.22–5.81)
RBC: 2.88 MIL/uL — ABNORMAL LOW (ref 4.22–5.81)
RDW: 15.8 % — ABNORMAL HIGH (ref 11.5–15.5)
RDW: 15.9 % — ABNORMAL HIGH (ref 11.5–15.5)
RDW: 15.9 % — ABNORMAL HIGH (ref 11.5–15.5)
WBC: 10.6 10*3/uL — ABNORMAL HIGH (ref 4.0–10.5)
WBC: 10.4 K/uL (ref 4.0–10.5)
WBC: 9.3 K/uL (ref 4.0–10.5)
nRBC: 0.2 % (ref 0.0–0.2)
nRBC: 0.3 % — ABNORMAL HIGH (ref 0.0–0.2)
nRBC: 0.2 % (ref 0.0–0.2)

## 2021-10-05 LAB — GLUCOSE, CAPILLARY
Glucose-Capillary: 159 mg/dL — ABNORMAL HIGH (ref 70–99)
Glucose-Capillary: 158 mg/dL — ABNORMAL HIGH (ref 70–99)
Glucose-Capillary: 153 mg/dL — ABNORMAL HIGH (ref 70–99)
Glucose-Capillary: 119 mg/dL — ABNORMAL HIGH (ref 70–99)

## 2021-10-05 LAB — PREPARE FRESH FROZEN PLASMA: Unit division: 0

## 2021-10-05 LAB — BPAM FFP
Blood Product Expiration Date: 202308292359
ISSUE DATE / TIME: 202308250759
Unit Type and Rh: 5100

## 2021-10-05 LAB — BPAM PLATELET PHERESIS
Blood Product Expiration Date: 202308272359
Blood Product Expiration Date: 202308272359
Blood Product Expiration Date: 202308282359
ISSUE DATE / TIME: 202308250738
ISSUE DATE / TIME: 202308250738
ISSUE DATE / TIME: 202308251347
Unit Type and Rh: 5100
Unit Type and Rh: 5100
Unit Type and Rh: 6200

## 2021-10-05 LAB — DIC (DISSEMINATED INTRAVASCULAR COAGULATION)PANEL
D-Dimer, Quant: 5.42 ug/mL-FEU — ABNORMAL HIGH (ref 0.00–0.50)
D-Dimer, Quant: 6.88 ug/mL-FEU — ABNORMAL HIGH (ref 0.00–0.50)
Fibrinogen: 321 mg/dL (ref 210–475)
Fibrinogen: 332 mg/dL (ref 210–475)
INR: 1.6 — ABNORMAL HIGH (ref 0.8–1.2)
INR: 1.6 — ABNORMAL HIGH (ref 0.8–1.2)
Platelets: 89 10*3/uL — ABNORMAL LOW (ref 150–400)
Platelets: 72 10*3/uL — ABNORMAL LOW (ref 150–400)
Prothrombin Time: 19.2 seconds — ABNORMAL HIGH (ref 11.4–15.2)
Prothrombin Time: 19 seconds — ABNORMAL HIGH (ref 11.4–15.2)
Smear Review: NONE SEEN
Smear Review: NONE SEEN
aPTT: 40 seconds — ABNORMAL HIGH (ref 24–36)
aPTT: 47 seconds — ABNORMAL HIGH (ref 24–36)

## 2021-10-05 LAB — HEPARIN LEVEL (UNFRACTIONATED): Heparin Unfractionated: 0.19 IU/mL — ABNORMAL LOW (ref 0.30–0.70)

## 2021-10-05 LAB — BASIC METABOLIC PANEL WITH GFR
Anion gap: 8 (ref 5–15)
BUN: 60 mg/dL — ABNORMAL HIGH (ref 8–23)
CO2: 25 mmol/L (ref 22–32)
Calcium: 7.3 mg/dL — ABNORMAL LOW (ref 8.9–10.3)
Chloride: 111 mmol/L (ref 98–111)
Creatinine, Ser: 2.82 mg/dL — ABNORMAL HIGH (ref 0.61–1.24)
GFR, Estimated: 25 mL/min — ABNORMAL LOW
Glucose, Bld: 178 mg/dL — ABNORMAL HIGH (ref 70–99)
Potassium: 3.6 mmol/L (ref 3.5–5.1)
Sodium: 144 mmol/L (ref 135–145)

## 2021-10-05 LAB — HEPATIC FUNCTION PANEL
ALT: 11 U/L (ref 0–44)
AST: 28 U/L (ref 15–41)
Albumin: 2 g/dL — ABNORMAL LOW (ref 3.5–5.0)
Alkaline Phosphatase: 37 U/L — ABNORMAL LOW (ref 38–126)
Bilirubin, Direct: 0.3 mg/dL — ABNORMAL HIGH (ref 0.0–0.2)
Indirect Bilirubin: 0.7 mg/dL (ref 0.3–0.9)
Total Bilirubin: 1 mg/dL (ref 0.3–1.2)
Total Protein: 3.7 g/dL — ABNORMAL LOW (ref 6.5–8.1)

## 2021-10-05 LAB — APTT
aPTT: 41 s — ABNORMAL HIGH (ref 24–36)
aPTT: 48 seconds — ABNORMAL HIGH (ref 24–36)

## 2021-10-05 LAB — PREPARE PLATELET PHERESIS
Unit division: 0
Unit division: 0
Unit division: 0

## 2021-10-05 LAB — PROTIME-INR
INR: 1.6 — ABNORMAL HIGH (ref 0.8–1.2)
Prothrombin Time: 18.7 s — ABNORMAL HIGH (ref 11.4–15.2)

## 2021-10-05 LAB — CALCIUM, IONIZED
Calcium, Ionized, Serum: 4.3 mg/dL — ABNORMAL LOW (ref 4.5–5.6)
Calcium, Ionized, Serum: 4.3 mg/dL — ABNORMAL LOW (ref 4.5–5.6)

## 2021-10-05 LAB — FIBRINOGEN: Fibrinogen: 310 mg/dL (ref 210–475)

## 2021-10-05 LAB — LACTATE DEHYDROGENASE: LDH: 239 U/L — ABNORMAL HIGH (ref 98–192)

## 2021-10-05 MED ORDER — FUROSEMIDE 10 MG/ML IJ SOLN
60.0000 mg | Freq: Two times a day (BID) | INTRAMUSCULAR | Status: AC
Start: 2021-10-05 — End: 2021-10-06
  Administered 2021-10-05: 60 mg via INTRAVENOUS
  Filled 2021-10-05 (×2): qty 6

## 2021-10-05 MED ORDER — HYDROMORPHONE BOLUS VIA INFUSION
0.2500 mg | INTRAVENOUS | Status: DC | PRN
  Administered 2021-10-05 (×2): 1.5 mg via INTRAVENOUS
  Administered 2021-10-06 (×3): 2 mg via INTRAVENOUS
  Administered 2021-10-10 (×2): 1 mg via INTRAVENOUS
  Administered 2021-10-10 – 2021-10-12 (×8): 2 mg via INTRAVENOUS
  Administered 2021-10-16 (×2): 1 mg via INTRAVENOUS
  Administered 2021-10-17 – 2021-10-22 (×21): 2 mg via INTRAVENOUS

## 2021-10-05 MED ORDER — HEPARIN (PORCINE) 25000 UT/250ML-% IV SOLN
400.0000 [IU]/h | INTRAVENOUS | Status: DC
  Administered 2021-10-05: 400 [IU]/h via INTRAVENOUS
  Filled 2021-10-05: qty 250

## 2021-10-05 MED ORDER — QUETIAPINE FUMARATE 50 MG PO TABS
50.0000 mg | ORAL_TABLET | Freq: Two times a day (BID) | ORAL | Status: DC
  Administered 2021-10-05 – 2021-10-18 (×25): 50 mg
  Filled 2021-10-05 (×25): qty 1

## 2021-10-05 MED ORDER — FUROSEMIDE 10 MG/ML IJ SOLN
40.0000 mg | Freq: Two times a day (BID) | INTRAMUSCULAR | Status: DC
Start: 2021-10-05 — End: 2021-10-05
  Filled 2021-10-05: qty 4

## 2021-10-05 MED ORDER — POTASSIUM CHLORIDE 10 MEQ/50ML IV SOLN
10.0000 meq | INTRAVENOUS | Status: AC
  Administered 2021-10-05 (×3): 10 meq via INTRAVENOUS
  Filled 2021-10-05 (×3): qty 50

## 2021-10-05 MED ORDER — CISATRACURIUM BOLUS VIA INFUSION: 10.0000 mg | Freq: Once | INTRAVENOUS | Status: DC | PRN

## 2021-10-05 MED ORDER — SODIUM CHLORIDE 0.9 % IV SOLN
1.0000 mg/h | INTRAVENOUS | Status: DC
  Administered 2021-10-05: 1 mg/h via INTRAVENOUS
  Administered 2021-10-05: 3.5 mg/h via INTRAVENOUS
  Administered 2021-10-06 – 2021-10-15 (×20): 4 mg/h via INTRAVENOUS
  Administered 2021-10-16: 2 mg/h via INTRAVENOUS
  Administered 2021-10-16: 3 mg/h via INTRAVENOUS
  Administered 2021-10-17 – 2021-10-18 (×3): 4 mg/h via INTRAVENOUS
  Administered 2021-10-19: 2 mg/h via INTRAVENOUS
  Administered 2021-10-20 – 2021-10-22 (×3): 1.5 mg/h via INTRAVENOUS
  Filled 2021-10-05 (×34): qty 5

## 2021-10-05 MED ORDER — HYDROMORPHONE HCL 1 MG/ML IJ SOLN
1.0000 mg | Freq: Once | INTRAMUSCULAR | Status: AC
  Administered 2021-10-05: 1 mg via INTRAVENOUS

## 2021-10-05 MED ORDER — HYDRALAZINE HCL 20 MG/ML IJ SOLN
10.0000 mg | INTRAMUSCULAR | Status: DC | PRN
  Administered 2021-10-05 – 2021-10-06 (×4): 10 mg via INTRAVENOUS
  Filled 2021-10-05 (×5): qty 1

## 2021-10-05 MED ORDER — CLONAZEPAM 1 MG PO TABS
1.0000 mg | ORAL_TABLET | Freq: Three times a day (TID) | ORAL | Status: DC
Start: 2021-10-05 — End: 2021-10-22
  Administered 2021-10-05 – 2021-10-22 (×49): 1 mg
  Filled 2021-10-05 (×49): qty 1

## 2021-10-05 NOTE — Progress Notes (Signed)
Patient ID: [name], male   DOB: Jul 07, 1960, 61 y.o.   MRN: [id number]    Advanced Heart Failure Rounding Note   Subjective:    - 8/19 Pericardial window - 8/20 Cardiac arrest with tamponade -> Emergent bedside washout - 09/29/21 VA Cannulation - 09/30/21 Return to OR for mediastinal hemorrhage - 10/01/21 Developed AF -> amio - 10/02/21 TEE EF 25-30%  - 10/04/21 Back to OR for washout. C/b continued bleeding overnight  More bleeding during the day yesterday from chest tubes, seemed to slow overnight.  Patient had total of 5 units PRBCs, 3 plts, 1 FFP.  INR 1.6 this morning with PTT 40.  He remains off anticoagulation.   Remains intubated/sedated. On VA ECMO (see VA ECMO Note). Chest remains open. Following commands per RN when sedation weaned but currently on Fentanyl @ 400.  SBP 140, off NE and vasopressin, remains on epinephrine 2.    NSR on amiodarone gtt 30  Circuit flowing well. LDH 180 => 239.  Speed 3300 rpm Flow 3.63 Pven -62 DeltaP 18 Sweep 2 ABG 7.46/40/286/100%  Hgb 9.6  Creatinine 2.5 => 2.8, UOP 910 with Lasix 40 mg IV x 1 yesterday. CVP 11.   He remains on vancomycin/meropenem with open chest   Objective:   Weight Range:  Vital Signs:   Temp:  [97 F (36.1 C)-98.1 F (36.7 C)] 97 F (36.1 C) (08/26 0730) Pulse Rate:  [50-80] 56 (08/26 0730) Resp:  [18-21] 20 (08/26 0730) BP: (81-124)/(61-82) 124/66 (08/26 0323) SpO2:  [100 %] 100 % (08/26 0730) Arterial Line BP: (72-160)/(59-83) 160/66 (08/26 0730) FiO2 (%):  [21 %] 21 % (08/26 0400) Weight:  [108.4 kg] 108.4 kg (08/26 0630) Last BM Date : 10/03/21  Weight change: Filed Weights   10/03/21 0500 10/04/21 0500 10/05/21 0630  Weight: 104.3 kg 105.4 kg 108.4 kg    Intake/Output:   Intake/Output Summary (Last 24 hours) at 10/05/2021 0741 Last data filed at 10/05/2021 0700 Gross per 24 hour  Intake 8185.46 ml  Output 2230 ml  Net 5955.46 ml     Physical Exam: General: Intubated/sedated.   Neck: JVP 10-12 cm, no thyromegaly or thyroid nodule.  Lungs: Clear to auscultation bilaterally with normal respiratory effort. CV: Open chest with arterial VA ECMO return.  Abdomen: Soft, nontender, no hepatosplenomegaly, no distention.  Skin: Intact without lesions or rashes.  Neurologic: Sedated on vent. Extremities: No clubbing or cyanosis.  HEENT: Normal.    Telemetry: Sinus 60s Personally reviewed   Labs: Basic Metabolic Panel: Recent Labs  Lab 09/30/21 1650 09/30/21 1936 10/01/21 0430 10/01/21 0431 10/01/21 1616 10/01/21 1623 10/02/21 0354 10/02/21 0528 10/02/21 2124 10/03/21 0408 10/03/21 0414 10/03/21 0905 10/03/21 1612 10/03/21 1615 10/03/21 1942 10/03/21 2257 10/04/21 0256 10/04/21 0623 10/04/21 1600 10/04/21 2212 10/05/21 0033 10/05/21 0138 10/05/21 0441 10/05/21 0451  NA 139   < > 138   < > 141   < > 140   < > 142   < > 141   < > 142   < > 144  144   < > 144   < > 144 144 144 142 144 145  K 4.0   < > 4.1   < > 4.3   < > 4.2   < > 3.7   < > 4.2   < > 4.0   < > 4.2  4.2   < > 4.5   < > 4.3 3.8 3.6 3.5 3.6 3.7  CL 106  --  109  --  108  --  109   < > 110  --  111   < > 111  --  111  --  110  --  110  --   --   --  111  --   CO2 28  --  26  --  28  --  26   < > 24  --  26  --  28  --  29  --  28  --  25  --   --   --  25  --   GLUCOSE 126*  --  196*  --  183*  --  177*   < > 162*  --  149*   < > 171*  --  161*  --  142*  --  145*  --   --   --  178*  --   BUN 38*  --  39*  --  43*  --  47*   < > 51*  --  53*   < > 52*  --  49*  --  50*  --  52*  --   --   --  60*  --   CREATININE 2.44*  --  2.47*  --  2.62*  --  2.77*   < > 2.71*  --  2.78*   < > 2.50*  --  2.44*  --  2.47*  --  2.88*  --   --   --  2.82*  --   CALCIUM 7.6*  --  7.2*  --  7.4*  --  7.4*   < > 7.1*  --  7.3*  --  6.9*  --  7.4*  --  7.2*  --  7.2*  --   --   --  7.3*  --   MG 1.7  --  2.6*  --  2.8*  --  2.7*  --  2.5*  --  2.6*  --   --   --   --   --  2.3  --   --   --   --   --   --    --   PHOS 4.6  --  4.5  --  4.3  --  4.2  --   --   --  3.6  --   --   --   --   --   --   --   --   --   --   --   --   --    < > = values in this interval not displayed.    Liver Function Tests: Recent Labs  Lab 10/01/21 0430 10/02/21 0354 10/03/21 0414 10/04/21 0256 10/05/21 0441  AST 45* 43* 46* 22 28  ALT 19 17 18 12 11   ALKPHOS 43 74 116 43 37*  BILITOT 2.3* 1.2 1.1 1.1 1.0  PROT 3.6* 3.8* 4.1* 3.7* 3.7*  ALBUMIN 2.3* 2.0* 2.0* 2.0* 2.0*   No results for input(s): "LIPASE", "AMYLASE" in the last 168 hours. No results for input(s): "AMMONIA" in the last 168 hours.  CBC: Recent Labs  Lab 09/29/21 1956 09/29/21 2152 10/04/21 0500 10/04/21 2025 10/04/21 0747 10/04/21 0858 10/04/21 1245 10/04/21 1600 10/04/21 2212 10/05/21 0030 10/05/21 0033 10/05/21 0138 10/05/21 0441 10/05/21 0451  WBC 17.7*   < > 14.4*  --   --   --  11.7* 10.4  --  10.6*  --   --  10.4  --   NEUTROABS 13.0*  --   --   --   --   --   --   --   --   --   --   --   --   --   HGB 8.2*   < > 7.6*   < >  --    < > 8.2* 7.5*   < > 9.7* 8.2* 8.2* 9.6* 8.5*  HCT 24.4*   < > 22.3*   < >  --    < > 23.4* 21.3*   < > 27.6* 24.0* 24.0* 27.8* 25.0*  MCV 82.2   < > 86.4  --   --   --  83.3 83.9  --  84.1  --   --  86.1  --   PLT 258   < > 69*  --  59*  --  76* 90*  93*  --  80*  --   --  89*  87*  --    < > = values in this interval not displayed.    Cardiac Enzymes: No results for input(s): "CKTOTAL", "CKMB", "CKMBINDEX", "TROPONINI" in the last 168 hours.  BNP: BNP (last 3 results) Recent Labs    12/24/20 0651 09/28/21 0405  BNP 412.0* 826.6*    ProBNP (last 3 results) No results for input(s): "PROBNP" in the last 8760 hours.    Other results:  Imaging: DG Abd 1 View  Result Date: 10/04/2021 CLINICAL DATA:  Interval nasogastric tube placement. EXAM: ABDOMEN - 1 VIEW COMPARISON:  Earlier today. FINDINGS: Interval nasogastric tube with its tip in the distal esophagus and side hole in the  mid esophagus. Feeding tube extending through the stomach and into the small bowel with its tip in the jejunum beyond the ligament of Treitz, confirmed with injected contrast. Stable mediastinal and left chest tubes and decreased left basilar atelectasis. Unremarkable bowel gas pattern. Thoracic spine degenerative changes. IMPRESSION: 1. Nasogastric tube tip and side hole in the esophagus. This needs to be advanced approximately 20 cm for placement of the tip and side hole in the stomach. 2. Feeding tube placed with its tip in the proximal jejunum beyond the ligament of Treitz. 3. Improving left basilar atelectasis. Electronically Signed   By: Claudie Revering M.D.   On: 10/04/2021 12:18   DG Abd 1 View  Result Date: 10/04/2021 CLINICAL DATA:  Concern for ileus EXAM: ABDOMEN - 1 VIEW COMPARISON:  09/30/2021 FINDINGS: Exam limited due to numerous overlying support devices. Left basilar chest tube is noted. Dobbhoff tube terminates in the region the distal stomach/proximal duodenum. No dilated loops of bowel to indicate ileus or obstruction. IMPRESSION: No evidence of ileus. Electronically Signed   By: Miachel Roux M.D.   On: 10/04/2021 08:57   DG Chest Port 1 View  Result Date: 10/04/2021 CLINICAL DATA:  [id number]; ETT/OG/chest tube present, on ECMO, follow-up study EXAM: PORTABLE CHEST 1 VIEW COMPARISON:  Chest x-ray from yesterday FINDINGS: Cardiopericardial silhouette is prominent, stable. Again seen are the endotracheal tube, feeding tube, bilateral transjugular central venous catheters, ECMO cannula, left-sided chest tubes, mediastinal tube are stable. There is opacity seen at the left lung base with obliteration of the left hemidiaphragm, stable. Small right pleural effusion. The visualized skeletal structures are unremarkable. IMPRESSION: 1.  Stable support devices. 2.  Unchanged left basilar lung opacity. Electronically Signed   By: Frazier Richards M.D.   On: 10/04/2021 08:03   DG Chest [facility] 1 View  Result  Date: 10/03/2021 CLINICAL DATA:  Intraoperative imaging for pneumothorax. EXAM: PORTABLE CHEST 1 VIEW COMPARISON:  Prior chest radiographs 10/03/2021 and earlier. FINDINGS: ET tube present with tip terminating 1.5 cm above the level the carina. Right IJ approach central venous catheter with tip projecting at the level of the lower SVC. Left-sided central venous catheter with tip projecting at the level of the brachiocephalic/SVC confluence. An enteric tube passes below the level of the left hemidiaphragm with tip excluded from the field of view. A previously demonstrated left-sided pigtail chest tube is no longer present. The remaining chest tubes appear unchanged. Stable ECMO cannula position. Cardiomegaly, unchanged. Aortic atherosclerosis. Unchanged opacity at the left lung base, which may reflect pleural effusion, atelectasis and/or consolidation. No appreciable airspace consolidation within the right lung. No evidence of pneumothorax. IMPRESSION: Interval removal of a previously demonstrated left-sided pigtail chest tube. No evidence of pneumothorax. Otherwise, no significant change from yesterday chest radiograph, as described. Electronically Signed   By: Kellie Simmering D.O.   On: 10/03/2021 13:40     Medications:     Scheduled Medications:  sodium chloride   Intravenous Once   sodium chloride   Intravenous Once   aspirin EC  325 mg Oral BID   Or   aspirin  324 mg Per Tube BID   atorvastatin  80 mg Per Tube Daily   bisacodyl  10 mg Oral Daily   Or   bisacodyl  10 mg Rectal Daily   Chlorhexidine Gluconate Cloth  6 each Topical Daily   colchicine  0.6 mg Per Tube Daily   docusate  100 mg Per Tube BID   feeding supplement (PIVOT 1.5 CAL)  1,000 mL Per Tube Q24H   feeding supplement (PROSource TF20)  60 mL Per Tube QID   fentaNYL (SUBLIMAZE) injection  50 mcg Intravenous Once   furosemide  40 mg Intravenous BID    HYDROmorphone (DILAUDID) injection  1 mg Intravenous Once   insulin aspart  2-6  Units Subcutaneous Q4H   metoCLOPramide (REGLAN) injection  10 mg Intravenous Q6H   mouth rinse  15 mL Mouth Rinse Q2H   pantoprazole (PROTONIX) IV  40 mg Intravenous QHS   polyethylene glycol  17 g Per Tube Daily   sodium chloride flush  10-40 mL Intracatheter Q12H   sodium chloride flush  3 mL Intravenous Q12H    Infusions:  sodium chloride     albumin human Stopped (10/04/21 0801)   amiodarone 30 mg/hr (10/05/21 0700)   DOBUTamine Stopped (10/04/21 0618)   epinephrine 2 mcg/min (10/05/21 0700)   fentaNYL infusion INTRAVENOUS 400 mcg/hr (10/05/21 0700)   HYDROmorphone     meropenem (MERREM) IV Stopped (10/05/21 0547)   midazolam 30 mg/hr (10/05/21 0700)   norepinephrine (LEVOPHED) Adult infusion Stopped (10/05/21 0211)   potassium chloride     vancomycin Stopped (10/04/21 1902)   vasopressin Stopped (10/04/21 1852)    PRN Medications: acetaminophen, albumin human, artificial tears, fentaNYL, HYDROmorphone, ipratropium-albuterol, metoprolol tartrate, midazolam, midazolam, nitroGLYCERIN, ondansetron (ZOFRAN) IV, mouth rinse, sodium chloride flush, traMADol   Assessment/Plan:    1. Shock - mixed cardiogenic/hemorrhagic -> on VA ECMO - continue VA ECMO support - TEE 10/02/21 LVEF 25-30% RV mild to moderately down - now off NE and vasopressin, on epi 2 with SBP 140.  Will decrease epinephrine to 1 and hold there for RV function.  - Transfuse keep hgb >= 8.0 - I/Os markedly positive with blood products, CVP 11 => will start Lasix 60 mg IV bid  today.  - Continue ECMO. Will try to wean to Impella 5.5 next week - Not on anticoagulation, still with bleeding yesterday but seems to have slowed. Will discuss with surgery (see below).  2. Cardiac arrest (PEA/bradycardic) - 8/19 in setting of tamponade - rhythm stable.  3. Cardiac tamponade with emergent bedside sternotomy  - Diffuse epicardial bleeding with post-op Dresslers - return to OR 8/21 and 8/24 for washouts. Still with  bleeding, 5 units PRBCs yesterday along with Plts and FFP.  - transfuse to keep Hgb >= 8.0 - PTT 40, INR 1.6 => there is a degree of auto-anticoagulation present.  That being said, he has been off anticoagulation for his whole run so far.  Will need to discuss with surgery when we can begin low dose heparin gtt. LDH fairly stable at 239.  5. Acute hypoxemic respiratory failure - Now on VA ECMO - CXR with increasing pulmonary edema.  - Vent support per CCM - Diuresis as above.  6. AKI due to ATN - baseline 1.5-1.6  - 2.5 => 2.8 today - Volume overloaded with blood products.  Push diuresis today, may end up needing CVVH.  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 DM2 - continue SSI 10. PAF - continue amio - In NSR today 11. ID - On vancomycin/meropenem with open chest.  12. Neuro - Has followed commands with vent wean.  - Will transition from high dose Fentanyl to Dilaudid 13. FEN - TFs ongoing  CRITICAL CARE Performed by: Loralie Champagne  Total critical care time: 50 minutes  Critical care time was exclusive of separately billable procedures and treating other patients.  Critical care was necessary to treat or prevent imminent or life-threatening deterioration.  Critical care was time spent personally by me (independent of midlevel providers or residents) on the following activities: development of treatment plan with patient and/or surrogate as well as nursing, discussions with consultants, evaluation of patient's response to treatment, examination of patient, obtaining history from patient or surrogate, ordering and performing treatments and interventions, ordering and review of laboratory studies, ordering and review of radiographic studies, pulse oximetry and re-evaluation of patient's condition.  Length of Stay: [place] 10/05/2021, 7:41 AM  Advanced Heart Failure  Team Pager [phone] (M-F; 7a - 4p)  Please contact [facility] for night-coverage after hours (4p -7a ) and weekends on [email or web address]

## 2021-10-05 NOTE — Progress Notes (Signed)
NAME:  [name], MRN:  [id number], DOB:  Nov 20, 1960, LOS: 7 ADMISSION DATE:  09/28/2021, CONSULTATION DATE:  09/29/21 REFERRING MD:  Dr. Kipp Brood, CHIEF COMPLAINT:  Cardiac Arrest   History of Present Illness:  [name] is a 61 year old male with DMII, , CVA, atrial fibrillation on eliquis and multivessel CAD s/p 5 vessel CABG on 09/12/21 who presented 8/19 with shortness of breath via EMS. He was found to be bradycardic and hypoglycemic. He symptomatically improved with dextrose but continued to have bradycardia and soft blood pressures. ECHO obtained which showed LVEF 50%, grade II diastolic dysfunction and moderate pericardial effusion with possible tamponade physiology. CT Chest showed pericardial effusion and small bilateral pleural effusions. He was taken to the OR 8/19 for pericardial window.   Patient started having issues with tachy-brady arrhythmias on telemetry, decreased urine output and increased work of breathing over night. A 14 french pigtail catheter was placed in the left pleural space this morning with bloody drainage. At 10:35am patient became unresponsive with PEA arrest. He underwent multiple rounds of CPR with epinephrine pushes. ROSC was obtained and patient was intubated. Bedside US by cardiology showed large pericardial effusion with tamponade physiology. He lost pulse again and CPR was again performed. Please see code sheet for full details. Bedside sternotomy performed with pericardial drainage by CT surgery, immediate improvement in hemodynamics achieved with drainage of the pericardial space.   Pertinent  Medical History   Past Medical History:  Diagnosis Date   AKI (acute kidney injury) ([place])    pt unaware of this   Anginal pain ([place])    CAD (coronary artery disease)    a. 03/2015 NSTEMI: LHC with severe 3V CAD  (70% mid RCA, 95% OM1, 90% distal LCx, 90% OM3, 80% prox LAD and 90% ost D1) s/p DES to mLAD w/ small dissction Rx with DES, staged ost Ramus PCI/DES and  dLCx s/p PCI/DES    Chest pain 12/24/2020   Diabetes mellitus type 2 in obese [facility])    Diverticulosis    Dyspnea    Dyspnea on exertion 03/16/2015   Dyspnea on exertion   Family history of adverse reaction to anesthesia    patient father- pt states after anesthesia his father "developed dementia"   GERD (gastroesophageal reflux disease)    Hypercholesteremia    Hypertension associated with diabetes ([place]) 03/16/2015   hypertension   NSTEMI (non-ST elevated myocardial infarction) ([place]) 03/17/2015   Obesity    Stroke ([place]) 2022   pt states he had a "mini stroke" during cardiac catheterization   Tobacco abuse    Significant Hospital Events: Including procedures, antibiotic start and stop dates in addition to other pertinent events   8/19 admitted, s/p pericardial window 8/20 PEA cardiac arrest, left pigtail chest tube placement, PEA cardiac arrest due to tamponade, bedside sternotomy with pericardial drains placed. 8/21 return to OR for overnight bleeding.  8/22 minimal chest tube output.  Atrial fibrillation, controled with amiodarone.  8/22 TEE showed EF 35% at baseline, which improved significantly with decreasing ECMO flow.  8/23 tolerated diuresis  8/24 mediastinal washout, removal of hematoma 8/25 ongoing bleeding issues from posterior mediastinum  Interim History / Subjective:  Bleeding a bit better after plt transfusion yesterday. Needing escalating sedation.  Objective   Blood pressure 122/64, pulse (!) 56, temperature (!) 97 F (36.1 C), resp. rate 20, height 5\' 7"  (1.702 m), weight 108.4 kg, SpO2 100 %. CVP:  [2 mmHg-14 mmHg] 14 mmHg  Vent Mode: PRVC  FiO2 (%):  [21 %] 21 % Set Rate:  [20 bmp] 20 bmp Vt Set:  [390 mL] 390 mL PEEP:  [5 cmH20-10 cmH20] 5 cmH20 Plateau Pressure:  [16 cmH20-21 cmH20] 17 cmH20   Intake/Output Summary (Last 24 hours) at 10/05/2021 0758 Last data filed at 10/05/2021 0700 Gross per 24 hour  Intake 8185.46 ml  Output 2230 ml  Net 5955.46  ml    Filed Weights   10/03/21 0500 10/04/21 0500 10/05/21 0630  Weight: 104.3 kg 105.4 kg 108.4 kg   Examination: Constitutional: ill appearing heavily sedated man  Eyes: ~90mm, reactive, equal Ears, nose, mouth, and throat: ETT minimal secretions, cortrak in place Cardiovascular: ext lukewarm, multiple mediastinal drains with moderate bloody output Respiratory: distant, passive on vent, driving pressure 14 SWN4O Gastrointestinal: soft, hypoactive BS Skin: No rashes, normal turgor Neurologic: RASS -4 Foley yellow urine ECMO circuit looks okay   Ancillary tests personally review:   ABG 7.46/57/472 on sweep 2lpm, PRVC 20/390/5/0.21 BMP stable Lactate WNL H/H okay WBC stable Plts improved LDH ok Fibrinogen stable CXR worsening fluid overload  Assessment & Plan:  Hemorraghic Pericarditis w/ Cardiac Tamponade/obstructive shock causing PEA arrest s/p pericardial drain placement with subsequent cardiac stunning on VA ECMO S/P Coronary Artery Disease s/p CABG x 5 8/3 Atrial Fibrillation Acute Kidney 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 - Fluids, antiarrythmics and inotrope management per TCTS/CHF discussion - AC resumption at some point if mediastinal oozing improves - VA support through weekend with plans for impella 5-5 + decannulation early next week - Probably will need CRRT at some point  Acute Hypoxemic and Hypercapnic Respiratory Failure- driving pressure today 14cmH2O - Lung protective tidal volumes limiting driving pressures to < 15cm H2O as able - Sedation titrated to vent compliance and patient comfort using PAD orderset - VAP prevention bundle - Add per tube seroquel/klonipin - Fentanyl to switch to dilaudid  Sepsis due to Pericarditis - treatment as above - abx per CHF/TCTS  Acute Blood Loss Anemia - Avoid acidemia, hypothermia, excessive coagulopathy - Balanced transfusion as needed - Daily discussion risk/benefits for AC intensity  Hx of DMII- looks okay, SSI for  now  Intolerance to TF- now on trickles with postpyloric tube; having BM; can probably advance to goal  Best Practice (right click and "Reselect all SmartList Selections" daily)   Diet/type: TF DVT prophylaxis: not indicated GI prophylaxis: PPI Lines: Central line and yes and it is still needed Foley:  Yes, and it is still needed Code Status:  full code Last date of multidisciplinary goals of care discussion [Per primary]  32 min cc time independent of procedures  Erskine Emery MD PCCM

## 2021-10-05 NOTE — Progress Notes (Signed)
2 Days Post-Op Procedure(s) (LRB): MEDIASTINAL WASHOUT (N/A) TRANSESOPHAGEAL ECHOCARDIOGRAM (TEE) (N/A) APPLICATION OF WOUND VAC (N/A) Subjective: Minimal chest tube output 24 hrs- will start low dose iv heparin protocol. Hb stable > 8.0 BP improved.cont 1 mcg epi for RV pCO2 improved CXR, sternal dressing w/o sign of retained blood/ hematoma in mediastinum ECMO circuit doing well  Objective: Vital signs in last 24 hours: Temp:  [96.8 F (36 C)-97.9 F (36.6 C)] 96.8 F (36 C) (08/26 0830) Pulse Rate:  [50-80] 57 (08/26 0830) Cardiac Rhythm: Sinus bradycardia (08/26 0800) Resp:  [18-21] 20 (08/26 0830) BP: (81-124)/(61-76) 122/64 (08/26 0736) SpO2:  [100 %] 100 % (08/26 0830) Arterial Line BP: (75-160)/(59-83) 127/65 (08/26 0830) FiO2 (%):  [21 %] 21 % (08/26 0736) Weight:  [108.4 kg] 108.4 kg (08/26 0630)  Hemodynamic parameters for last 24 hours: CVP:  [3 mmHg-14 mmHg] 13 mmHg  Intake/Output from previous day: 08/25 0701 - 08/26 0700 In: 8185.5 [I.V.:2348.2; Blood:3607.5; NG/GT:643; IV Piggyback:1521.8] Out: 2230 [Urine:910; Stool:280; Chest Tube:1040] Intake/Output this shift: Total I/O In: 320.4 [I.V.:180.3; NG/GT:90; IV Piggyback:50.1] Out: 405 [Urine:395; Chest Tube:10]       Exam    General-  comfortable on vent    Neck- no JVD, no cervical adenopathy palpable, no carotid bruit   Lungs- clear without rales, wheezes   Cor- regular rate and rhythm, no murmur , gallop   Abdomen- soft, non-tender   Extremities - warm, non-tender, minimal edema   Neuro- oriented, appropriate, no focal weakness   Lab Results: Recent Labs    10/05/21 0030 10/05/21 0033 10/05/21 0441 10/05/21 0451 10/05/21 0734  WBC 10.6*  --  10.4  --   --   HGB 9.7*   < > 9.6* 8.5* 8.2*  HCT 27.6*   < > 27.8* 25.0* 24.0*  PLT 80*  --  89*  87*  --   --    < > = values in this interval not displayed.   BMET:  Recent Labs    10/04/21 1600 10/04/21 2212 10/05/21 0441 10/05/21 0451  10/05/21 0734  NA 144   < > 144 145 145  K 4.3   < > 3.6 3.7 3.6  CL 110  --  111  --   --   CO2 25  --  25  --   --   GLUCOSE 145*  --  178*  --   --   BUN 52*  --  60*  --   --   CREATININE 2.88*  --  2.82*  --   --   CALCIUM 7.2*  --  7.3*  --   --    < > = values in this interval not displayed.    PT/INR:  Recent Labs    10/05/21 0441  LABPROT 19.2*  18.7*  INR 1.6*  1.6*   ABG    Component Value Date/Time   PHART 7.466 (H) 10/05/2021 0734   HCO3 28.4 (H) 10/05/2021 0734   TCO2 30 10/05/2021 0734   ACIDBASEDEF 5.0 (H) 09/29/2021 1443   O2SAT 100 10/05/2021 0734   CBG (last 3)  Recent Labs    10/04/21 2316 10/05/21 0316 10/05/21 0731  GLUCAP 187* 159* 158*    Assessment/Plan: S/P Procedure(s) (LRB): MEDIASTINAL WASHOUT (N/A) TRANSESOPHAGEAL ECHOCARDIOGRAM (TEE) (N/A) APPLICATION OF WOUND VAC (N/A) Start low dose heparin Diuresis Plan repeat echo with ECMO flow decreased  in 48 hrs to assess biventricular function and to plan support strategy going forward : return to  OR  for washout, possible Impella 5.5 placement with ECMO decanulation, possible sternal closure.   LOS: 7 days    [name] 10/05/2021

## 2021-10-05 NOTE — Progress Notes (Signed)
Despite pt remaining off Epi, pt BP 158/84 MAP 104. Aundra Dubin MD notified. See new orders. Pt Urine color changed to red tinged over this past hour, Aundra Dubin MD notified & Erskine Emery MD notified. Pt started on sytemic heparin today per Dr. Prescott Gum orders. Chest tube output remains minimal and unchanged. No bleeding at insertion sites of ECMO cannulas or central lines. Will continue to monitor.

## 2021-10-05 NOTE — Progress Notes (Signed)
ANTICOAGULATION CONSULT NOTE - Initial Consult  Pharmacy Consult for heparin Indication:  ECMO circuit  No Known Allergies  Patient Measurements: Height: 5\' 7"  (170.2 cm) Weight: 108.4 kg (238 lb 15.7 oz) IBW/kg (Calculated) : 66.1 Heparin Dosing Weight: 88kg  Vital Signs: Temp: 96.8 F (36 C) (08/26 0830) BP: 122/64 (08/26 0736) Pulse Rate: 57 (08/26 0830)  Labs: Recent Labs    10/04/21 0256 10/04/21 0500 10/04/21 0747 10/04/21 0858 10/04/21 1600 10/04/21 2212 10/05/21 0030 10/05/21 0033 10/05/21 0441 10/05/21 0451 10/05/21 0734  HGB 9.5*   < >  --    < > 7.5*   < > 9.7*   < > 9.6* 8.5* 8.2*  HCT 27.5*   < >  --    < > 21.3*   < > 27.6*   < > 27.8* 25.0* 24.0*  PLT 67*   < > 59*   < > 90*  93*  --  80*  --  89*  87*  --   --   APTT 34  --  42*  --  35  --   --   --  40*  41*  --   --   LABPROT 12.3  --  15.7*  --  17.5*  --   --   --  19.2*  18.7*  --   --   INR 0.9  --  1.3*  --  1.5*  --   --   --  1.6*  1.6*  --   --   CREATININE 2.47*  --   --   --  2.88*  --   --   --  2.82*  --   --    < > = values in this interval not displayed.    Estimated Creatinine Clearance: 32.3 mL/min (A) (by C-G formula based on SCr of 2.82 mg/dL (H)).   Medical History: Past Medical History:  Diagnosis Date   AKI (acute kidney injury) ([place])    pt unaware of this   Anginal pain ([place])    CAD (coronary artery disease)    a. 03/2015 NSTEMI: LHC with severe 3V CAD  (70% mid RCA, 95% OM1, 90% distal LCx, 90% OM3, 80% prox LAD and 90% ost D1) s/p DES to mLAD w/ small dissction Rx with DES, staged ost Ramus PCI/DES and dLCx s/p PCI/DES    Chest pain 12/24/2020   Diabetes mellitus type 2 in obese [facility])    Diverticulosis    Dyspnea    Dyspnea on exertion 03/16/2015   Dyspnea on exertion   Family history of adverse reaction to anesthesia    patient father- pt states after anesthesia his father "developed dementia"   GERD (gastroesophageal reflux disease)    Hypercholesteremia     Hypertension associated with diabetes ([place]) 03/16/2015   hypertension   NSTEMI (non-ST elevated myocardial infarction) ([place]) 03/17/2015   Obesity    Stroke ([place]) 2022   pt states he had a "mini stroke" during cardiac catheterization   Tobacco abuse     Assessment: 61 year old male with history of CABG 8/3, admitted for VF arrest 8/20 and patient found to have large pericardial clot/effusion. A pericardial window was later performed ultimately required ECMO cannulation. Patient has received numerous blood/factor products due to bleeding. Bleeding improving this am, discussed with ECMO team and will start low dose heparin to preserve circuit.   Aptt 41 Hgb 8.2, plt 87, Fibrinogen 300s, LDH stable 239  Goal of Therapy:  Heparin  level 0.1-0.25 units/ml aPTT 45-60 seconds Monitor platelets by anticoagulation protocol: Yes   Plan:  Start systemic heparin at 400 units/hr Check anti-xa level and aptt every 12 hours 5a/5p Very slow adjustments for the next 48 hours  Erin Hearing PharmD., BCPS Clinical Pharmacist 10/05/2021 10:02 AM

## 2021-10-05 NOTE — Progress Notes (Signed)
Pharmacy Antibiotic Note  [name] is a 61 y.o. male admitted on 09/28/2021 s/p pericardial window > now with open chest/ECMO  Pharmacy has been consulted for vancomycin and meropenem dosing.  Patient currently afebrile, wbc normal at 10. Scr stable at 2.8. UOP low at 0.70ml/kg/hr.   Plan: Meropenem 1g q 12 hrs for now, will adjust as needed with renal function. Vancomycin 750 IV q24hr  - monitor renal function for dosing needs Consider recheck trough in 2-3 days  Height: 5\' 7"  (170.2 cm) Weight: 108.4 kg (238 lb 15.7 oz) IBW/kg (Calculated) : 66.1  Temp (24hrs), Avg:97.4 F (36.3 C), Min:96.8 F (36 C), Max:97.9 F (36.6 C)  Recent Labs  Lab 09/29/21 1719 09/29/21 1922 09/29/21 1956 09/30/21 0933 09/30/21 1140 09/30/21 1650 10/01/21 0430 10/01/21 1616 10/02/21 1208 10/02/21 1607 10/03/21 1612 10/03/21 1942 10/03/21 2255 10/04/21 0256 10/04/21 0500 10/04/21 1245 10/04/21 1600 10/05/21 0030 10/05/21 0441  WBC  --   --    < >  --  12.0*   < > 16.0*   < >  --    < > 11.0* 10.7*   < > 11.9* 14.4* 11.7* 10.4 10.6* 10.4  CREATININE 3.05*  --    < >  --  2.48*   < > 2.47*   < >  --    < > 2.50* 2.44*  --  2.47*  --   --  2.88*  --  2.82*  LATICACIDVEN 4.6* 2.3*  --   --  1.4  --  1.2  --   --   --   --   --   --  1.0  --   --   --   --   --   VANCOTROUGH  --   --   --   --   --   --   --   --  21*  --   --   --   --   --   --   --   --   --   --   VANCORANDOM  --   --   --  16  --   --   --   --   --   --   --   --   --   --   --   --   --   --   --    < > = values in this interval not displayed.     Estimated Creatinine Clearance: 32.3 mL/min (A) (by C-G formula based on SCr of 2.82 mg/dL (H)).    No Known Allergies  Antimicrobials this admission:  8/20 Vancomycin > 8/20 Meropenem >   Dose adjustments this admission:  8/23 VT 21  Microbiology results:  8/24 wound: ngtd  Erin Hearing PharmD., BCPS Clinical Pharmacist 10/05/2021 9:49 AM

## 2021-10-05 NOTE — Progress Notes (Signed)
Per Dr. Aundra Dubin, can stop EPI gtt. Pt BP 152/81 (99). Will reassess and titrate as needed.

## 2021-10-05 NOTE — Progress Notes (Signed)
ANTICOAGULATION CONSULT NOTE  Pharmacy Consult for heparin Indication:  ECMO circuit  No Known Allergies  Patient Measurements: Height: 5\' 7"  (170.2 cm) Weight: 108.4 kg (238 lb 15.7 oz) IBW/kg (Calculated) : 66.1 Heparin Dosing Weight: 88kg  Vital Signs: Temp: 96.6 F (35.9 C) (08/26 1700) Temp Source: Bladder (08/26 1600) BP: 157/80 (08/26 1535) Pulse Rate: 53 (08/26 1700)  Labs: Recent Labs    10/04/21 0256 10/04/21 0500 10/04/21 1600 10/04/21 2212 10/05/21 0030 10/05/21 0033 10/05/21 0441 10/05/21 0451 10/05/21 0734 10/05/21 1140 10/05/21 1700  HGB 9.5*   < > 7.5*   < > 9.7*   < > 9.6*   < > 8.2* 7.8* 8.7*  HCT 27.5*   < > 21.3*   < > 27.6*   < > 27.8*   < > 24.0* 23.0* 24.8*  PLT 67*   < > 90*  93*  --  80*  --  89*  87*  --   --   --  72*  74*  APTT 34   < > 35  --   --   --  40*  41*  --   --   --  PENDING  LABPROT 12.3   < > 17.5*  --   --   --  19.2*  18.7*  --   --   --  PENDING  INR 0.9   < > 1.5*  --   --   --  1.6*  1.6*  --   --   --  PENDING  HEPARINUNFRC  --   --   --   --   --   --   --   --   --   --  0.19*  CREATININE 2.47*  --  2.88*  --   --   --  2.82*  --   --   --   --    < > = values in this interval not displayed.     Estimated Creatinine Clearance: 32.3 mL/min (A) (by C-G formula based on SCr of 2.82 mg/dL (H)).   Medical History: Past Medical History:  Diagnosis Date   AKI (acute kidney injury) ([place])    pt unaware of this   Anginal pain ([place])    CAD (coronary artery disease)    a. 03/2015 NSTEMI: LHC with severe 3V CAD  (70% mid RCA, 95% OM1, 90% distal LCx, 90% OM3, 80% prox LAD and 90% ost D1) s/p DES to mLAD w/ small dissction Rx with DES, staged ost Ramus PCI/DES and dLCx s/p PCI/DES    Chest pain 12/24/2020   Diabetes mellitus type 2 in obese [facility])    Diverticulosis    Dyspnea    Dyspnea on exertion 03/16/2015   Dyspnea on exertion   Family history of adverse reaction to anesthesia    patient father- pt states after  anesthesia his father "developed dementia"   GERD (gastroesophageal reflux disease)    Hypercholesteremia    Hypertension associated with diabetes ([place]) 03/16/2015   hypertension   NSTEMI (non-ST elevated myocardial infarction) ([place]) 03/17/2015   Obesity    Stroke ([place]) 2022   pt states he had a "mini stroke" during cardiac catheterization   Tobacco abuse     Assessment: 61 year old male with history of CABG 8/3, admitted for VF arrest 8/20 and patient found to have large pericardial clot/effusion. A pericardial window was later performed ultimately required ECMO cannulation. Patient has received numerous blood/factor products due to bleeding.  Bleeding improving this am, discussed with ECMO team and will start low dose heparin to preserve circuit.   Evening heparin level is 0.19, aPTT is 47, CBC low but stable. Pt noted to have hematuria which started after heparin initiation, no other bleeding noted.  Goal of Therapy:  Heparin level 0.1-0.25 units/ml aPTT 45-60 seconds Monitor platelets by anticoagulation protocol: Yes   Plan:  Continue heparin 400 units/h Q12h aPTT/heparin level checks  Arrie Senate, PharmD, BCPS, [facility] Clinical Pharmacist [phone] Please check AMION for all [facility] Pharmacy numbers 10/05/2021

## 2021-10-05 NOTE — Progress Notes (Addendum)
CT Surgery PM Note  Stable day with improved BP now off epinephrine Will continue dobutamine NSR  urine 1.5 L today- I/O balance + 300 cc ECMO cirduit good- low dose heparin started and Hb stable 8.7  Blood pressure (!) 157/80, pulse (!) 55, temperature (!) 97.2 F (36.2 C), resp. rate 20, height 5\' 7"  (1.702 m), weight 108.4 kg, SpO2 100 %.

## 2021-10-05 NOTE — Progress Notes (Signed)
Patient ID: [name], male   DOB: 1960-04-14, 61 y.o.   MRN: [id number] ECMO NOTE:   Indication: Cardiac arrest   Initial cannulation date: 09/29/21   ECMO type: VA ECMO   Dual lumen inflow/return cannula:   1) 23/25 FR multistage venous cannula in RFV 2) Central aortic arterial return   ECMO events:   - 8/19 Pericardial window - 8/20 Cardiac arrest with tamponade -> Emergent bedside washout - 09/29/21 VA Cannulation - 09/30/21 Return to OR for mediastinal hemorrhage - 8/22 AF - 8/23 TEE: EF 25-30% Moderate to severe RV dysfunction - 8/24 TO OR for repeat washout. C/b severe post-op bleeding   Daily data:   Flow 3.63 L RPM 3300 Sweep  2 L   Labs:   ABG Labs (Brief)          Component Value Date/Time    PHART 7.453 (H) 10/04/2021 1218    PCO2ART 43.0 10/04/2021 1218    PO2ART 485 (H) 10/04/2021 1218    HCO3 30.2 (H) 10/04/2021 1218    TCO2 32 10/04/2021 1218    ACIDBASEDEF 5.0 (H) 09/29/2021 1443    O2SAT 100 10/04/2021 1218          Plan:  - More bleeding over the last day, 5 units PRBCs.  Slowed overnight.  - Markedly volume overloaded - Lasix 40 mg IV bid, may need higher dose - Discuss AC with surgery - Potential eventual wean to Impella 5.5   Discussed in multidisciplinary fashion   Loralie Champagne 10/05/2021 7:40 AM

## 2021-10-06 ENCOUNTER — Inpatient Hospital Stay (HOSPITAL_COMMUNITY): Payer: Self-pay

## 2021-10-06 LAB — TYPE AND SCREEN
ABO/RH(D): O POS
Antibody Screen: NEGATIVE
Unit division: 0
Unit division: 0
Unit division: 0
Unit division: 0
Unit division: 0
Unit division: 0
Unit division: 0
Unit division: 0
Unit division: 0
Unit division: 0
Unit division: 0
Unit division: 0
Unit division: 0
Unit division: 0
Unit division: 0
Unit division: 0
Unit division: 0
Unit division: 0
Unit division: 0
Unit division: 0
Unit division: 0
Unit division: 0
Unit division: 0
Unit division: 0
Unit division: 0

## 2021-10-06 LAB — POCT I-STAT 7, (LYTES, BLD GAS, ICA,H+H)
Acid-Base Excess: 6 mmol/L — ABNORMAL HIGH (ref 0.0–2.0)
Acid-Base Excess: 7 mmol/L — ABNORMAL HIGH (ref 0.0–2.0)
Acid-Base Excess: 4 mmol/L — ABNORMAL HIGH (ref 0.0–2.0)
Acid-Base Excess: 3 mmol/L — ABNORMAL HIGH (ref 0.0–2.0)
Bicarbonate: 30.5 mmol/L — ABNORMAL HIGH (ref 20.0–28.0)
Bicarbonate: 31.6 mmol/L — ABNORMAL HIGH (ref 20.0–28.0)
Bicarbonate: 29.4 mmol/L — ABNORMAL HIGH (ref 20.0–28.0)
Bicarbonate: 28.8 mmol/L — ABNORMAL HIGH (ref 20.0–28.0)
Calcium, Ion: 1.21 mmol/L (ref 1.15–1.40)
Calcium, Ion: 1.17 mmol/L (ref 1.15–1.40)
Calcium, Ion: 1.25 mmol/L (ref 1.15–1.40)
Calcium, Ion: 1.22 mmol/L (ref 1.15–1.40)
HCT: 21 % — ABNORMAL LOW (ref 39.0–52.0)
HCT: 23 % — ABNORMAL LOW (ref 39.0–52.0)
HCT: 18 % — ABNORMAL LOW (ref 39.0–52.0)
HCT: 22 % — ABNORMAL LOW (ref 39.0–52.0)
Hemoglobin: 7.1 g/dL — ABNORMAL LOW (ref 13.0–17.0)
Hemoglobin: 7.8 g/dL — ABNORMAL LOW (ref 13.0–17.0)
Hemoglobin: 6.1 g/dL — CL (ref 13.0–17.0)
Hemoglobin: 7.5 g/dL — ABNORMAL LOW (ref 13.0–17.0)
O2 Saturation: 100 %
O2 Saturation: 100 %
O2 Saturation: 100 %
O2 Saturation: 100 %
Patient temperature: 36.2
Patient temperature: 36
Patient temperature: 35.8
Patient temperature: 36
Potassium: 3.6 mmol/L (ref 3.5–5.1)
Potassium: 3.5 mmol/L (ref 3.5–5.1)
Potassium: 4 mmol/L (ref 3.5–5.1)
Potassium: 4.1 mmol/L (ref 3.5–5.1)
Sodium: 146 mmol/L — ABNORMAL HIGH (ref 135–145)
Sodium: 146 mmol/L — ABNORMAL HIGH (ref 135–145)
Sodium: 145 mmol/L (ref 135–145)
Sodium: 146 mmol/L — ABNORMAL HIGH (ref 135–145)
TCO2: 32 mmol/L (ref 22–32)
TCO2: 33 mmol/L — ABNORMAL HIGH (ref 22–32)
TCO2: 31 mmol/L (ref 22–32)
TCO2: 30 mmol/L (ref 22–32)
pCO2 arterial: 43.6 mmHg (ref 32–48)
pCO2 arterial: 43.8 mmHg (ref 32–48)
pCO2 arterial: 49 mmHg — ABNORMAL HIGH (ref 32–48)
pCO2 arterial: 48 mmHg (ref 32–48)
pH, Arterial: 7.451 — ABNORMAL HIGH (ref 7.35–7.45)
pH, Arterial: 7.462 — ABNORMAL HIGH (ref 7.35–7.45)
pH, Arterial: 7.381 (ref 7.35–7.45)
pH, Arterial: 7.382 (ref 7.35–7.45)
pO2, Arterial: 272 mmHg — ABNORMAL HIGH (ref 83–108)
pO2, Arterial: 299 mmHg — ABNORMAL HIGH (ref 83–108)
pO2, Arterial: 286 mmHg — ABNORMAL HIGH (ref 83–108)
pO2, Arterial: 290 mmHg — ABNORMAL HIGH (ref 83–108)

## 2021-10-06 LAB — BPAM RBC
Blood Product Expiration Date: 202309142359
Blood Product Expiration Date: 202309142359
Blood Product Expiration Date: 202309262359
Blood Product Expiration Date: 202309262359
Blood Product Expiration Date: 202309262359
Blood Product Expiration Date: 202309262359
Blood Product Expiration Date: 202309262359
Blood Product Expiration Date: 202309262359
Blood Product Expiration Date: 202309272359
Blood Product Expiration Date: 202309282359
Blood Product Expiration Date: 202309282359
Blood Product Expiration Date: 202309292359
Blood Product Expiration Date: 202309292359
Blood Product Expiration Date: 202309292359
Blood Product Expiration Date: 202309292359
Blood Product Expiration Date: 202309292359
Blood Product Expiration Date: 202309292359
Blood Product Expiration Date: 202309292359
Blood Product Expiration Date: 202309292359
Blood Product Expiration Date: 202309292359
Blood Product Expiration Date: 202309292359
Blood Product Expiration Date: 202309292359
Blood Product Expiration Date: 202309292359
Blood Product Expiration Date: 202309292359
Blood Product Expiration Date: 202309292359
ISSUE DATE / TIME: 202308231751
ISSUE DATE / TIME: 202308240804
ISSUE DATE / TIME: 202308240804
ISSUE DATE / TIME: 202308240918
ISSUE DATE / TIME: 202308241336
ISSUE DATE / TIME: 202308241336
ISSUE DATE / TIME: 202308241719
ISSUE DATE / TIME: 202308241719
ISSUE DATE / TIME: 202308242028
ISSUE DATE / TIME: 202308242028
ISSUE DATE / TIME: 202308250002
ISSUE DATE / TIME: 202308250002
ISSUE DATE / TIME: 202308250002
ISSUE DATE / TIME: 202308250002
ISSUE DATE / TIME: 202308250456
ISSUE DATE / TIME: 202308250456
ISSUE DATE / TIME: 202308250738
ISSUE DATE / TIME: 202308250738
ISSUE DATE / TIME: 202308251743
ISSUE DATE / TIME: 202308251939
ISSUE DATE / TIME: 202308251939
Unit Type and Rh: 5100
Unit Type and Rh: 5100
Unit Type and Rh: 5100
Unit Type and Rh: 5100
Unit Type and Rh: 5100
Unit Type and Rh: 5100
Unit Type and Rh: 5100
Unit Type and Rh: 5100
Unit Type and Rh: 5100
Unit Type and Rh: 5100
Unit Type and Rh: 5100
Unit Type and Rh: 5100
Unit Type and Rh: 5100
Unit Type and Rh: 5100
Unit Type and Rh: 5100
Unit Type and Rh: 5100
Unit Type and Rh: 5100
Unit Type and Rh: 5100
Unit Type and Rh: 5100
Unit Type and Rh: 5100
Unit Type and Rh: 5100
Unit Type and Rh: 5100
Unit Type and Rh: 5100
Unit Type and Rh: 5100
Unit Type and Rh: 5100

## 2021-10-06 LAB — HEPARIN LEVEL (UNFRACTIONATED)
Heparin Unfractionated: 0.15 IU/mL — ABNORMAL LOW (ref 0.30–0.70)
Heparin Unfractionated: <0.1 IU/mL — ABNORMAL LOW (ref 0.30–0.70)

## 2021-10-06 LAB — CBC
HCT: 24.6 % — ABNORMAL LOW (ref 39.0–52.0)
HCT: 22.7 % — ABNORMAL LOW (ref 39.0–52.0)
Hemoglobin: 8.4 g/dL — ABNORMAL LOW (ref 13.0–17.0)
Hemoglobin: 7.7 g/dL — ABNORMAL LOW (ref 13.0–17.0)
MCH: 29.9 pg (ref 26.0–34.0)
MCH: 30.6 pg (ref 26.0–34.0)
MCHC: 34.1 g/dL (ref 30.0–36.0)
MCHC: 33.9 g/dL (ref 30.0–36.0)
MCV: 87.5 fL (ref 80.0–100.0)
MCV: 90.1 fL (ref 80.0–100.0)
Platelets: 74 10*3/uL — ABNORMAL LOW (ref 150–400)
Platelets: 72 10*3/uL — ABNORMAL LOW (ref 150–400)
RBC: 2.81 MIL/uL — ABNORMAL LOW (ref 4.22–5.81)
RBC: 2.52 MIL/uL — ABNORMAL LOW (ref 4.22–5.81)
RDW: 16.1 % — ABNORMAL HIGH (ref 11.5–15.5)
RDW: 16.7 % — ABNORMAL HIGH (ref 11.5–15.5)
WBC: 9.7 10*3/uL (ref 4.0–10.5)
WBC: 9.8 10*3/uL (ref 4.0–10.5)
nRBC: 0.2 % (ref 0.0–0.2)
nRBC: 0 % (ref 0.0–0.2)

## 2021-10-06 LAB — GLUCOSE, CAPILLARY
Glucose-Capillary: 126 mg/dL — ABNORMAL HIGH (ref 70–99)
Glucose-Capillary: 154 mg/dL — ABNORMAL HIGH (ref 70–99)
Glucose-Capillary: 124 mg/dL — ABNORMAL HIGH (ref 70–99)
Glucose-Capillary: 108 mg/dL — ABNORMAL HIGH (ref 70–99)
Glucose-Capillary: 119 mg/dL — ABNORMAL HIGH (ref 70–99)
Glucose-Capillary: 111 mg/dL — ABNORMAL HIGH (ref 70–99)
Glucose-Capillary: 145 mg/dL — ABNORMAL HIGH (ref 70–99)
Glucose-Capillary: 145 mg/dL — ABNORMAL HIGH (ref 70–99)

## 2021-10-06 LAB — POCT I-STAT, CHEM 8
BUN: 76 mg/dL — ABNORMAL HIGH (ref 8–23)
Calcium, Ion: 1.22 mmol/L (ref 1.15–1.40)
Chloride: 109 mmol/L (ref 98–111)
Creatinine, Ser: 3.1 mg/dL — ABNORMAL HIGH (ref 0.61–1.24)
Glucose, Bld: 146 mg/dL — ABNORMAL HIGH (ref 70–99)
HCT: 21 % — ABNORMAL LOW (ref 39.0–52.0)
Hemoglobin: 7.1 g/dL — ABNORMAL LOW (ref 13.0–17.0)
Potassium: 3.6 mmol/L (ref 3.5–5.1)
Sodium: 147 mmol/L — ABNORMAL HIGH (ref 135–145)
TCO2: 29 mmol/L (ref 22–32)

## 2021-10-06 LAB — BASIC METABOLIC PANEL
Anion gap: 5 (ref 5–15)
Anion gap: 3 — ABNORMAL LOW (ref 5–15)
BUN: 72 mg/dL — ABNORMAL HIGH (ref 8–23)
BUN: 76 mg/dL — ABNORMAL HIGH (ref 8–23)
CO2: 25 mmol/L (ref 22–32)
CO2: 27 mmol/L (ref 22–32)
Calcium: 7.9 mg/dL — ABNORMAL LOW (ref 8.9–10.3)
Calcium: 8 mg/dL — ABNORMAL LOW (ref 8.9–10.3)
Chloride: 114 mmol/L — ABNORMAL HIGH (ref 98–111)
Chloride: 115 mmol/L — ABNORMAL HIGH (ref 98–111)
Creatinine, Ser: 3.09 mg/dL — ABNORMAL HIGH (ref 0.61–1.24)
Creatinine, Ser: 3.27 mg/dL — ABNORMAL HIGH (ref 0.61–1.24)
GFR, Estimated: 22 mL/min — ABNORMAL LOW (ref >60)
GFR, Estimated: 21 mL/min — ABNORMAL LOW (ref >60)
Glucose, Bld: 139 mg/dL — ABNORMAL HIGH (ref 70–99)
Glucose, Bld: 165 mg/dL — ABNORMAL HIGH (ref 70–99)
Potassium: 4 mmol/L (ref 3.5–5.1)
Potassium: 4 mmol/L (ref 3.5–5.1)
Sodium: 144 mmol/L (ref 135–145)
Sodium: 145 mmol/L (ref 135–145)

## 2021-10-06 LAB — DIC (DISSEMINATED INTRAVASCULAR COAGULATION)PANEL
D-Dimer, Quant: 8.92 ug/mL-FEU — ABNORMAL HIGH (ref 0.00–0.50)
Fibrinogen: 387 mg/dL (ref 210–475)
INR: 1.5 — ABNORMAL HIGH (ref 0.8–1.2)
Platelets: 69 10*3/uL — ABNORMAL LOW (ref 150–400)
Prothrombin Time: 18 seconds — ABNORMAL HIGH (ref 11.4–15.2)
Smear Review: NONE SEEN
aPTT: 39 seconds — ABNORMAL HIGH (ref 24–36)

## 2021-10-06 LAB — HEPATIC FUNCTION PANEL
ALT: 10 U/L (ref 0–44)
AST: 99 U/L — ABNORMAL HIGH (ref 15–41)
Albumin: 1.9 g/dL — ABNORMAL LOW (ref 3.5–5.0)
Alkaline Phosphatase: 73 U/L (ref 38–126)
Bilirubin, Direct: 0.8 mg/dL — ABNORMAL HIGH (ref 0.0–0.2)
Indirect Bilirubin: 1.4 mg/dL — ABNORMAL HIGH (ref 0.3–0.9)
Total Bilirubin: 2.2 mg/dL — ABNORMAL HIGH (ref 0.3–1.2)
Total Protein: 3.9 g/dL — ABNORMAL LOW (ref 6.5–8.1)

## 2021-10-06 LAB — APTT
aPTT: 38 seconds — ABNORMAL HIGH (ref 24–36)
aPTT: 44 seconds — ABNORMAL HIGH (ref 24–36)

## 2021-10-06 LAB — LACTATE DEHYDROGENASE
LDH: 1256 U/L — ABNORMAL HIGH (ref 98–192)
LDH: 1276 U/L — ABNORMAL HIGH (ref 98–192)

## 2021-10-06 LAB — PREPARE RBC (CROSSMATCH)

## 2021-10-06 LAB — FIBRINOGEN: Fibrinogen: 375 mg/dL (ref 210–475)

## 2021-10-06 LAB — CALCIUM, IONIZED: Calcium, Ionized, Serum: 4.5 mg/dL (ref 4.5–5.6)

## 2021-10-06 LAB — PROTIME-INR
INR: 1.5 — ABNORMAL HIGH (ref 0.8–1.2)
Prothrombin Time: 18.2 seconds — ABNORMAL HIGH (ref 11.4–15.2)

## 2021-10-06 LAB — VANCOMYCIN, TROUGH: Vancomycin Tr: 26 ug/mL (ref 15–20)

## 2021-10-06 MED ORDER — POTASSIUM CHLORIDE 10 MEQ/50ML IV SOLN
10.0000 meq | INTRAVENOUS | Status: AC
  Administered 2021-10-06 (×4): 10 meq via INTRAVENOUS
  Filled 2021-10-06 (×4): qty 50

## 2021-10-06 MED ORDER — HEPARIN (PORCINE) 25000 UT/250ML-% IV SOLN
400.0000 [IU]/h | INTRAVENOUS | Status: DC
Start: 2021-10-06 — End: 2021-10-07
  Administered 2021-10-06: 400 [IU]/h via INTRAVENOUS

## 2021-10-06 MED ORDER — FUROSEMIDE 10 MG/ML IJ SOLN
40.0000 mg | Freq: Once | INTRAMUSCULAR | Status: AC
  Administered 2021-10-06: 40 mg via INTRAVENOUS
  Filled 2021-10-06: qty 4

## 2021-10-06 MED ORDER — FUROSEMIDE 10 MG/ML IJ SOLN
12.0000 mg/h | INTRAVENOUS | Status: DC
  Administered 2021-10-06: 8 mg/h via INTRAVENOUS
  Administered 2021-10-07: 12 mg/h via INTRAVENOUS
  Filled 2021-10-06 (×3): qty 20

## 2021-10-06 MED ORDER — VANCOMYCIN VARIABLE DOSE PER UNSTABLE RENAL FUNCTION (PHARMACIST DOSING): Status: DC

## 2021-10-06 MED ORDER — CEFAZOLIN SODIUM-DEXTROSE 2-4 GM/100ML-% IV SOLN
2.0000 g | INTRAVENOUS | Status: AC
  Administered 2021-10-07: 2 g via INTRAVENOUS
  Filled 2021-10-06: qty 100

## 2021-10-06 MED ORDER — ACETAZOLAMIDE 250 MG PO TABS
250.0000 mg | ORAL_TABLET | Freq: Once | ORAL | Status: AC
Start: 2021-10-06 — End: 2021-10-06
  Administered 2021-10-06: 250 mg
  Filled 2021-10-06: qty 1

## 2021-10-06 MED ORDER — SODIUM CHLORIDE 0.9% IV SOLUTION: Freq: Once | INTRAVENOUS | Status: AC

## 2021-10-06 NOTE — Progress Notes (Signed)
Patient ID: MAXIMIANO LOTT, male   DOB: 11-22-1960, 61 y.o.   MRN: [id number] ECMO NOTE:   Indication: Cardiac arrest   Initial cannulation date: 09/29/21   ECMO type: VA ECMO   Dual lumen inflow/return cannula:   1) 23/25 FR multistage venous cannula in RFV 2) Central aortic arterial return   ECMO events:   - 8/19 Pericardial window - 8/20 Cardiac arrest with tamponade -> Emergent bedside washout - 09/29/21 VA Cannulation - 09/30/21 Return to OR for mediastinal hemorrhage - 8/22 AF - 8/23 TEE: EF 25-30% Moderate to severe RV dysfunction - 8/24 TO OR for repeat washout. C/b severe post-op bleeding   Daily data:   Flow 3.9 L RPM 3000 Sweep  2 L   Labs:   ABG Labs (Brief)          Component Value Date/Time    PHART 7.453 (H) 10/04/2021 1218    PCO2ART 43.0 10/04/2021 1218    PO2ART 485 (H) 10/04/2021 1218    HCO3 30.2 (H) 10/04/2021 1218    TCO2 32 10/04/2021 1218    ACIDBASEDEF 5.0 (H) 09/29/2021 1443    O2SAT 100 10/04/2021 1218          Plan:  - No further pRBCs yesterday but hematuria and bleeding from drainage cannula site developed, heparin gtt stopped. - Markedly volume overloaded, weight up.  - Lasix 40 mg IV x 1 then 8 mg/hr - Discuss AC with surgery - Potential eventual wean to Imp[place] 5.5   Discussed in multidisciplinary fashion   Loralie Champagne 10/06/2021 7:35 AM

## 2021-10-06 NOTE — Progress Notes (Signed)
Tamala Julian MD and Aundra Dubin MD notified of "hemolyzed" labs again this evening. Results in chart and are mostly as expected except for LDH. ECMO circuit running okay. Also notified of bleeding at right femoral drainage cannula site. Will try sending LDH again shortly and keep heparin gtt on for now.

## 2021-10-06 NOTE — Progress Notes (Addendum)
Patient ID: [name], male   DOB: [birth year], 61 y.o.   MRN: [id number]    Advanced Heart Failure Rounding Note   Subjective:    - 8/19 Pericardial window - 8/20 Cardiac arrest with tamponade -> Emergent bedside washout - 09/29/21 VA Cannulation - 09/30/21 Return to OR for mediastinal hemorrhage - 10/01/21 Developed AF -> amio - 10/02/21 TEE EF 25-30%  - 10/04/21 Back to OR for washout. C/b continued bleeding overnight  No further blood products yesterday and heparin gtt low dose started.  However, in pm developed hematuria and bleeding at venous drainage site, heparin stopped by Dr. Prescott Gum.  Today, still with hematuria.  Hgb 8.4.   Remains intubated/sedated. On VA ECMO (see VA ECMO Note). Chest remains open. Following commands per RN when sedation weaned, on Versed/Dilaudid at high doses.   SBP 130s off pressors.      NSR on amiodarone gtt 30  Circuit flowing well. LDH 180 => 239 => pending.  Speed 3000 rpm Flow 3.9 Pven -64 DeltaP 18 Sweep 2 ABG 7.45/44/272/100%  Hgb 9.6 => 8.4  Creatinine 2.5 => 2.8 => 3.01 => pending, UOP 1765 with Lasix 60 mg IV x 1 yesterday (pm dose held due to chugging). CVP 11. Weight continues to rise.   He remains on vancomycin/meropenem with open chest   Objective:   Weight Range:  Vital Signs:   Temp:  [96.6 F (35.9 C)-97.2 F (36.2 C)] 97 F (36.1 C) (08/27 0700) Pulse Rate:  [40-67] 53 (08/27 0700) Resp:  [0-20] 20 (08/27 0700) BP: (157)/(80) 157/80 (08/26 1535) SpO2:  [99 %-100 %] 100 % (08/27 0700) Arterial Line BP: (105-164)/(55-87) 133/68 (08/27 0700) FiO2 (%):  [21 %] 21 % (08/27 0415) Last BM Date : 10/05/21  Weight change: Filed Weights   10/03/21 0500 10/04/21 0500 10/05/21 0630  Weight: 104.3 kg 105.4 kg 108.4 kg    Intake/Output:   Intake/Output Summary (Last 24 hours) at 10/06/2021 0737 Last data filed at 10/06/2021 0700 Gross per 24 hour  Intake 3117.6 ml  Output 3035 ml  Net 82.6 ml     Physical  Exam: General: Sedated on vent Neck: JVP 10-12, no thyromegaly or thyroid nodule.  Lungs: Clear to auscultation bilaterally with normal respiratory effort. CV: Chest open. 1+ edema to knees.   Abdomen: Soft, nontender, no hepatosplenomegaly, no distention.  Skin: Intact without lesions or rashes.  Neurologic: Sedated Extremities: No clubbing or cyanosis.  HEENT: Normal.    Telemetry: Sinus 50s Personally reviewed   Labs: Basic Metabolic Panel: Recent Labs  Lab 09/30/21 1650 09/30/21 1936 10/01/21 0430 10/01/21 0431 10/01/21 1616 10/01/21 1623 10/02/21 0354 10/02/21 0528 10/02/21 2124 10/03/21 0408 10/03/21 0414 10/03/21 0905 10/03/21 1942 10/03/21 2257 10/04/21 0256 10/04/21 0623 10/04/21 1600 10/04/21 2212 10/05/21 0441 10/05/21 0451 10/05/21 1140 10/05/21 1638 10/05/21 1857 10/05/21 2044 10/06/21 0409  NA 139   < > 138   < > 141   < > 140   < > 142   < > 141   < > 144  144   < > 144   < > 144   < > 144   < > 145 145 144 146* 146*  K 4.0   < > 4.1   < > 4.3   < > 4.2   < > 3.7   < > 4.2   < > 4.2  4.2   < > 4.5   < > 4.3   < > 3.6   < >  3.8 3.6 3.6 3.6 3.6  CL 106  --  109  --  108  --  109   < > 110  --  111   < > 111  --  110  --  110  --  111  --   --   --  114*  --   --   CO2 28  --  26  --  28  --  26   < > 24  --  26   < > 29  --  28  --  25  --  25  --   --   --  25  --   --   GLUCOSE 126*  --  196*  --  183*  --  177*   < > 162*  --  149*   < > 161*  --  142*  --  145*  --  178*  --   --   --  142*  --   --   BUN 38*  --  39*  --  43*  --  47*   < > 51*  --  53*   < > 49*  --  50*  --  52*  --  60*  --   --   --  65*  --   --   CREATININE 2.44*  --  2.47*  --  2.62*  --  2.77*   < > 2.71*  --  2.78*   < > 2.44*  --  2.47*  --  2.88*  --  2.82*  --   --   --  3.01*  --   --   CALCIUM 7.6*  --  7.2*  --  7.4*  --  7.4*   < > 7.1*  --  7.3*   < > 7.4*  --  7.2*  --  7.2*  --  7.3*  --   --   --  7.3*  --   --   MG 1.7  --  2.6*  --  2.8*  --  2.7*  --  2.5*   --  2.6*  --   --   --  2.3  --   --   --   --   --   --   --   --   --   --   PHOS 4.6  --  4.5  --  4.3  --  4.2  --   --   --  3.6  --   --   --   --   --   --   --   --   --   --   --   --   --   --    < > = values in this interval not displayed.    Liver Function Tests: Recent Labs  Lab 10/01/21 0430 10/02/21 0354 10/03/21 0414 10/04/21 0256 10/05/21 0441  AST 45* 43* 46* 22 28  ALT 19 17 18 12 11   ALKPHOS 43 74 116 43 37*  BILITOT 2.3* 1.2 1.1 1.1 1.0  PROT 3.6* 3.8* 4.1* 3.7* 3.7*  ALBUMIN 2.3* 2.0* 2.0* 2.0* 2.0*   No results for input(s): "LIPASE", "AMYLASE" in the last 168 hours. No results for input(s): "AMMONIA" in the last 168 hours.  CBC: Recent Labs  Lab 09/29/21 1956 09/29/21 2152 10/04/21 1600 10/04/21 2212 10/05/21 0030 10/05/21 0033 10/05/21 0441 10/05/21 0451 10/05/21 1638 10/05/21 1700  10/05/21 2044 10/06/21 0401 10/06/21 0409 10/06/21 0609  WBC 17.7*   < > 10.4  --  10.6*  --  10.4  --   --  9.3  --  9.7  --   --   NEUTROABS 13.0*  --   --   --   --   --   --   --   --   --   --   --   --   --   HGB 8.2*   < > 7.5*   < > 9.7*   < > 9.6*   < > 7.5* 8.7* 7.1* 8.4* 7.1*  --   HCT 24.4*   < > 21.3*   < > 27.6*   < > 27.8*   < > 22.0* 24.8* 21.0* 24.6* 21.0*  --   MCV 82.2   < > 83.9  --  84.1  --  86.1  --   --  86.1  --  87.5  --   --   PLT 258   < > 90*  93*  --  80*  --  89*  87*  --   --  72*  74*  --  74*  --  PENDING   < > = values in this interval not displayed.    Cardiac Enzymes: No results for input(s): "CKTOTAL", "CKMB", "CKMBINDEX", "TROPONINI" in the last 168 hours.  BNP: BNP (last 3 results) Recent Labs    12/24/20 0651 09/28/21 0405  BNP 412.0* 826.6*    ProBNP (last 3 results) No results for input(s): "PROBNP" in the last 8760 hours.    Other results:  Imaging: DG CHEST PORT 1 VIEW  Result Date: 10/05/2021 CLINICAL DATA:  Chemotherapy EXAM: PORTABLE CHEST 1 VIEW COMPARISON:  October 04, 2021 FINDINGS: The  cardiomediastinal silhouette is stable. No pneumothorax. The ETT, left central line, left chest tubes, right central line, mediastinal tube, and ECMO catheter are in stable position. The feeding tube terminates below today's film. Haziness over the right chest is likely a small layering effusion. Persistent opacity in the left base, likely a small effusion with underlying atelectasis. No other interval changes. IMPRESSION: 1. Stable support devices as above. 2. Stable left basilar opacity. 3. Mild haziness over the right chest is likely a small layering effusion. Electronically Signed   By: Dorise Bullion III M.D.   On: 10/05/2021 08:33   DG Abd 1 View  Result Date: 10/04/2021 CLINICAL DATA:  Interval nasogastric tube placement. EXAM: ABDOMEN - 1 VIEW COMPARISON:  Earlier today. FINDINGS: Interval nasogastric tube with its tip in the distal esophagus and side hole in the mid esophagus. Feeding tube extending through the stomach and into the small bowel with its tip in the jejunum beyond the ligament of Treitz, confirmed with injected contrast. Stable mediastinal and left chest tubes and decreased left basilar atelectasis. Unremarkable bowel gas pattern. Thoracic spine degenerative changes. IMPRESSION: 1. Nasogastric tube tip and side hole in the esophagus. This needs to be advanced approximately 20 cm for placement of the tip and side hole in the stomach. 2. Feeding tube placed with its tip in the proximal jejunum beyond the ligament of Treitz. 3. Improving left basilar atelectasis. Electronically Signed   By: Claudie Revering M.D.   On: 10/04/2021 12:18   DG Abd 1 View  Result Date: 10/04/2021 CLINICAL DATA:  Concern for ileus EXAM: ABDOMEN - 1 VIEW COMPARISON:  09/30/2021 FINDINGS: Exam limited due to numerous overlying support devices. Left  basilar chest tube is noted. Dobbhoff tube terminates in the region the distal stomach/proximal duodenum. No dilated loops of bowel to indicate ileus or obstruction.  IMPRESSION: No evidence of ileus. Electronically Signed   By: Miachel Roux M.D.   On: 10/04/2021 08:57     Medications:     Scheduled Medications:  sodium chloride   Intravenous Once   sodium chloride   Intravenous Once   acetaZOLAMIDE  250 mg Per Tube Once   aspirin EC  325 mg Oral BID   Or   aspirin  324 mg Per Tube BID   atorvastatin  80 mg Per Tube Daily   bisacodyl  10 mg Oral Daily   Or   bisacodyl  10 mg Rectal Daily   Chlorhexidine Gluconate Cloth  6 each Topical Daily   clonazePAM  1 mg Per Tube Q8H   colchicine  0.6 mg Per Tube Daily   docusate  100 mg Per Tube BID   feeding supplement (PIVOT 1.5 CAL)  1,000 mL Per Tube Q24H   feeding supplement (PROSource TF20)  60 mL Per Tube QID   fentaNYL (SUBLIMAZE) injection  50 mcg Intravenous Once   furosemide  40 mg Intravenous Once   furosemide  60 mg Intravenous BID   insulin aspart  2-6 Units Subcutaneous Q4H   metoCLOPramide (REGLAN) injection  10 mg Intravenous Q6H   mouth rinse  15 mL Mouth Rinse Q2H   pantoprazole (PROTONIX) IV  40 mg Intravenous QHS   polyethylene glycol  17 g Per Tube Daily   QUEtiapine  50 mg Per Tube BID   sodium chloride flush  10-40 mL Intracatheter Q12H   sodium chloride flush  3 mL Intravenous Q12H    Infusions:  sodium chloride     albumin human 12.5 g (10/05/21 2259)   amiodarone 30 mg/hr (10/06/21 0700)   DOBUTamine Stopped (10/04/21 0618)   epinephrine Stopped (10/05/21 1045)   furosemide (LASIX) 200 mg in dextrose 5 % 100 mL (2 mg/mL) infusion     HYDROmorphone 4 mg/hr (10/06/21 0700)   meropenem (MERREM) IV Stopped (10/06/21 0558)   midazolam 30 mg/hr (10/06/21 0700)   norepinephrine (LEVOPHED) Adult infusion Stopped (10/05/21 1033)   vancomycin Stopped (10/05/21 1927)   vasopressin Stopped (10/04/21 1852)    PRN Medications: acetaminophen, albumin human, artificial tears, cisatracurium, fentaNYL, hydrALAZINE, HYDROmorphone, ipratropium-albuterol, metoprolol tartrate,  midazolam, midazolam, nitroGLYCERIN, ondansetron (ZOFRAN) IV, mouth rinse, sodium chloride flush, traMADol   Assessment/Plan:    1. Shock - mixed cardiogenic/hemorrhagic -> on VA ECMO - continue VA ECMO support - TEE 10/02/21 LVEF 25-30% RV mild to moderately down - now off pressors with SBP 130s, has prn hydralazine.   - Transfuse keep hgb >= 8.0 (8.4 this morning).  - Weight up again, CVP 11.  Only got 1 bolus Lasix yesterday due to chugging.  Creatinine rising, up to 3.01 last night (pending this morning).  Will give Lasix 40 mg IV x 1 then gtt at 8 mg/hr.  Concerned he will need CVVH for volume management.  - Continue ECMO. Aim to wean to [place] 5.5 later this week.  - Anticoagulation stopped yesterday pm with hematuria and bleeding at cannula site, still with hematuria but cannula site bleeding has subsided.  Will discuss with surgery restarted low dose heparin gtt aiming PTT 40-60 => discussed with Dr. Prescott Gum, will restart heparin gtt 400 U/hr.   2. Cardiac arrest (PEA/bradycardic) - 8/19 in setting of tamponade - rhythm stable.  3.  Cardiac tamponade with emergent bedside sternotomy  - Diffuse epicardial bleeding with post-op Dresslers - return to OR 8/21 and 8/24 for washouts.  - No PRBCs during day yesterday or overnight.  - transfuse to keep Hgb >= 8.0 - PTT 40, INR 1.6 => there is a degree of auto-anticoagulation present.  That being said, he has been off anticoagulation for his whole run so far except for a few hours yesterday (stopped again due to hematuria/drainage catheter site bleeding).  Will discuss with surgery restarting low dose heparin gtt. LDH pending this morning => restart heparin 400 U/hr as above.  5. Acute hypoxemic respiratory failure - Now on VA ECMO - CXR with increasing pulmonary edema.  - Vent support per CCM - Diuresis as above.  6. AKI due to ATN - baseline 1.5-1.6  - 2.5 => 2.8 => 3.01 => pending - Volume overloaded and in need of diuresis, suspect  he may end up needing CVVH. 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 DM2 - continue SSI 10. PAF - continue amio - In NSR today 11. ID - On vancomycin/meropenem with open chest.  12. Neuro - Has followed commands with vent wean.  - Requiring high dose sedation.  13. FEN - TFs ongoing  CRITICAL CARE Performed by: Loralie Champagne  Total critical care time: 50 minutes  Critical care time was exclusive of separately billable procedures and treating other patients.  Critical care was necessary to treat or prevent imminent or life-threatening deterioration.  Critical care was time spent personally by me (independent of midlevel providers or residents) on the following activities: development of treatment plan with patient and/or surrogate as well as nursing, discussions with consultants, evaluation of patient's response to treatment, examination of patient, obtaining history from patient or surrogate, ordering and performing treatments and interventions, ordering and review of laboratory studies, ordering and review of radiographic studies, pulse oximetry and re-evaluation of patient's condition.  Length of Stay: [place] 10/06/2021, 7:37 AM  Advanced Heart Failure Team Pager [phone] (M-F; 7a - 4p)  Please contact [facility] for night-coverage after hours (4p -7a ) and weekends on [email or web address]

## 2021-10-06 NOTE — Progress Notes (Signed)
NAME:  [name], MRN:  [id number], DOB:  [birth year], LOS: 8 ADMISSION DATE:  09/28/2021, CONSULTATION DATE:  09/29/21 REFERRING MD:  Dr. Kipp Brood, CHIEF COMPLAINT:  Cardiac Arrest   History of Present Illness:  [name] is a 61 year old male with DMII, , CVA, atrial fibrillation on eliquis and multivessel CAD s/p 5 vessel CABG on 09/12/21 who presented 8/19 with shortness of breath via EMS. He was found to be bradycardic and hypoglycemic. He symptomatically improved with dextrose but continued to have bradycardia and soft blood pressures. ECHO obtained which showed LVEF 50%, grade II diastolic dysfunction and moderate pericardial effusion with possible tamponade physiology. CT Chest showed pericardial effusion and small bilateral pleural effusions. He was taken to the OR 8/19 for pericardial window.   Patient started having issues with tachy-brady arrhythmias on telemetry, decreased urine output and increased work of breathing over night. A 14 french pigtail catheter was placed in the left pleural space this morning with bloody drainage. At 10:35am patient became unresponsive with PEA arrest. He underwent multiple rounds of CPR with epinephrine pushes. ROSC was obtained and patient was intubated. Bedside US by cardiology showed large pericardial effusion with tamponade physiology. He lost pulse again and CPR was again performed. Please see code sheet for full details. Bedside sternotomy performed with pericardial drainage by CT surgery, immediate improvement in hemodynamics achieved with drainage of the pericardial space.   Pertinent  Medical History   Past Medical History:  Diagnosis Date   AKI (acute kidney injury) ([place])    pt unaware of this   Anginal pain ([place])    CAD (coronary artery disease)    a. 03/2015 NSTEMI: LHC with severe 3V CAD  (70% mid RCA, 95% OM1, 90% distal LCx, 90% OM3, 80% prox LAD and 90% ost D1) s/p DES to mLAD w/ small dissction Rx with DES, staged ost Ramus PCI/DES and  dLCx s/p PCI/DES    Chest pain 12/24/2020   Diabetes mellitus type 2 in obese [facility])    Diverticulosis    Dyspnea    Dyspnea on exertion 03/16/2015   Dyspnea on exertion   Family history of adverse reaction to anesthesia    patient father- pt states after anesthesia his father "developed dementia"   GERD (gastroesophageal reflux disease)    Hypercholesteremia    Hypertension associated with diabetes ([place]) 03/16/2015   hypertension   NSTEMI (non-ST elevated myocardial infarction) ([place]) 03/17/2015   Obesity    Stroke ([place]) 2022   pt states he had a "mini stroke" during cardiac catheterization   Tobacco abuse    Significant Hospital Events: Including procedures, antibiotic start and stop dates in addition to other pertinent events   8/19 admitted, s/p pericardial window 8/20 PEA cardiac arrest, left pigtail chest tube placement, PEA cardiac arrest due to tamponade, bedside sternotomy with pericardial drains placed. 8/21 return to OR for overnight bleeding.  8/22 minimal chest tube output.  Atrial fibrillation, controled with amiodarone.  8/22 TEE showed EF 35% at baseline, which improved significantly with decreasing ECMO flow.  8/23 tolerated diuresis  8/24 mediastinal washout, removal of hematoma 8/25 ongoing bleeding issues from posterior mediastinum  Interim History / Subjective:  No events. Issues with labs hemolyzing. BP is good. RPMs needing to go up a bit to maintain flows.  Objective   Blood pressure 117/68, pulse (!) 53, temperature (!) 96.4 F (35.8 C), resp. rate (!) 23, height 5\' 7"  (1.702 m), weight 108.4 kg, SpO2 100 %. CVP:  [  5 mmHg-12 mmHg] 11 mmHg  Vent Mode: PRVC FiO2 (%):  [21 %] 21 % Set Rate:  [20 bmp] 20 bmp Vt Set:  [390 mL] 390 mL PEEP:  [5 cmH20] 5 cmH20 Plateau Pressure:  [19 FRT02-11 cmH20] 20 cmH20   Intake/Output Summary (Last 24 hours) at 10/06/2021 1855 Last data filed at 10/06/2021 1800 Gross per 24 hour  Intake 2970.07 ml  Output 2860 ml   Net 110.07 ml    Filed Weights   10/03/21 0500 10/04/21 0500 10/05/21 0630  Weight: 104.3 kg 105.4 kg 108.4 kg   Examination: Constitutional: ill appearing heavily sedated man  Eyes: ~40mm, reactive, equal, stable Ears, nose, mouth, and throat: ETT minimal secretions, cortrak in place (postpyloric) Cardiovascular: ext lukewarm, multiple mediastinal drains with minimal bloody output; marked anasarca Respiratory: distant, passive on vent, driving pressure 12 [id number] Gastrointestinal: soft, hypoactive BS Skin: No rashes, normal turgor Neurologic: RASS -4 Foley yellow urine ECMO circuit looks okay   Ancillary tests personally review:   ABG 7.38/49/286 on sweep 2lpm, PRVC 20/390/5/0.21 BMP stable AKI Lactate WNL H/H okay WBC stable Plts ok LDH pending (hemolysis) Fibrinogen stable CXR worsening fluid overload  Assessment & Plan:  Hemorraghic Pericarditis w/ Cardiac Tamponade/obstructive shock causing PEA arrest s/p pericardial drain placement with subsequent cardiac stunning on VA ECMO S/P Coronary Artery Disease s/p CABG x 5 8/3 Atrial Fibrillation Acute Kidney 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 - Fluids, antiarrythmics and inotrope management per TCTS/CHF discussion - AC resumption per TCTS - Oxygenator seems to be getting less functional, plan tentatively for OR in AM for washout and hopefully decannulation otherwise need to consider circuit change - Probably will need CRRT at some point soon, starting lasix gtt today  Acute Hypoxemic and Hypercapnic Respiratory Failure- driving pressure today 12 cmH2O - Lung protective tidal volumes limiting driving pressures to < 15cm H2O as able - Sedation titrated to vent compliance and patient comfort using PAD orderset - VAP prevention bundle - Seroquel/klonipin - Dilaudid and versed gtt to RASS -4 given open sternum, will turn down versed a bit today; he was waking up a few days ago, following commmands; suspect he will be slow to emerge from this  much sedation  Sepsis due to Pericarditis - treatment as above - abx per CHF/TCTS  Acute Blood Loss Anemia- ongoing issue with any AC challenge - Avoid acidemia, hypothermia, excessive coagulopathy - Balanced transfusion as needed - Daily discussion risk/benefits for AC intensity  Hx of DMII- looks okay, SSI for now  Intolerance to TF- improved with postpyloric tube  Best Practice (right click and "Reselect all SmartList Selections" daily)   Diet/type: TF DVT prophylaxis: see discussion above GI prophylaxis: PPI Lines: Central line and yes and it is still needed Foley:  Yes, and it is still needed Code Status:  full code Last date of multidisciplinary goals of care discussion [Per primary]  31 min cc time independent of procedures  Erskine Emery MD PCCM

## 2021-10-06 NOTE — Progress Notes (Addendum)
ANTICOAGULATION CONSULT NOTE  Pharmacy Consult for heparin Indication:  ECMO circuit  No Known Allergies  Patient Measurements: Height: 5\' 7"  (170.2 cm) Weight: 108.4 kg (238 lb 15.7 oz) IBW/kg (Calculated) : 66.1 Heparin Dosing Weight: 88kg  Vital Signs: Temp: 97 F (36.1 C) (08/27 0700) Pulse Rate: 53 (08/27 0700)  Labs: Recent Labs    10/04/21 1600 10/04/21 2212 10/05/21 0441 10/05/21 0451 10/05/21 1700 10/05/21 1857 10/05/21 2044 10/06/21 0401 10/06/21 0409  HGB 7.5*   < > 9.6*   < > 8.7*  --  7.1* 8.4* 7.1*  HCT 21.3*   < > 27.8*   < > 24.8*  --  21.0* 24.6* 21.0*  PLT 90*  93*   < > 89*  87*  --  72*  74*  --   --  74*  --   APTT 35  --  40*  41*  --  48*  47*  --   --  38*  --   LABPROT 17.5*  --  19.2*  18.7*  --  19.0*  --   --  18.2*  --   INR 1.5*  --  1.6*  1.6*  --  1.6*  --   --  1.5*  --   HEPARINUNFRC  --   --   --   --  0.19*  --   --  0.15*  --   CREATININE 2.88*  --  2.82*  --   --  3.01*  --   --   --    < > = values in this interval not displayed.     Estimated Creatinine Clearance: 30.3 mL/min (A) (by C-G formula based on SCr of 3.01 mg/dL (H)).   Medical History: Past Medical History:  Diagnosis Date   AKI (acute kidney injury) ([place])    pt unaware of this   Anginal pain ([place])    CAD (coronary artery disease)    a. 03/2015 NSTEMI: LHC with severe 3V CAD  (70% mid RCA, 95% OM1, 90% distal LCx, 90% OM3, 80% prox LAD and 90% ost D1) s/p DES to mLAD w/ small dissction Rx with DES, staged ost Ramus PCI/DES and dLCx s/p PCI/DES    Chest pain 12/24/2020   Diabetes mellitus type 2 in obese [facility])    Diverticulosis    Dyspnea    Dyspnea on exertion 03/16/2015   Dyspnea on exertion   Family history of adverse reaction to anesthesia    patient father- pt states after anesthesia his father "developed dementia"   GERD (gastroesophageal reflux disease)    Hypercholesteremia    Hypertension associated with diabetes ([place]) 03/16/2015    hypertension   NSTEMI (non-ST elevated myocardial infarction) ([place]) 03/17/2015   Obesity    Stroke ([place]) 2022   pt states he had a "mini stroke" during cardiac catheterization   Tobacco abuse     Assessment: 61 year old male with history of CABG 8/3, admitted for VF arrest 8/20 and patient found to have large pericardial clot/effusion. A pericardial window was later performed ultimately required ECMO cannulation. Patient has received numerous blood/factor products due to bleeding. Bleeding improving this am, discussed with ECMO team and will start low dose heparin to preserve circuit.   Morning heparin level is 0.15, aPTT is 38, Hgb low but stable at 8.4, no blood required overnight. Pt noted to have hematuria which started after heparin initiation, then subsequent bleeding for mouth and cannula site. Surgery placed heparin on hold early this  am. Will discuss with surgery prior to rechallenging. Minimal fibrin noted on circuit. LDH pending.   Goal of Therapy:  Heparin level 0.1-0.25 units/ml aPTT 45-60 seconds Monitor platelets by anticoagulation protocol: Yes   Plan:  Hold heparin for now to let bleeding subside   Erin Hearing PharmD., BCPS Clinical Pharmacist 10/06/2021 7:40 AM  Addendum: Surgery and cardiology would like to trial low dose heparin again this am. Will follow closely for increased bleeding.   10/06/2021 8:57 AM

## 2021-10-06 NOTE — Progress Notes (Signed)
Labs sent from multiple lines and BMET has hemolyzed 4 times. Phlebotomy to come to bedside to draw labs. CHEM-8 ran on iSTAT, resulting in K 3.6 and creatinine 3.1. Aundra Dubin MD notified. Order received for 4 runs of IV KCl.

## 2021-10-06 NOTE — Progress Notes (Signed)
3 Days Post-Op Procedure(s) (LRB): MEDIASTINAL WASHOUT (N/A) TRANSESOPHAGEAL ECHOCARDIOGRAM (TEE) (N/A) APPLICATION OF WOUND VAC (N/A) Subjective: Patient continues to be very stable with hemodynamics maintaining good pulsatility off all inotropes, maintaining sinus rhythm on IV amiodarone.  CVP 11 and chest x-ray remains fairly clear.  Currently on heparin 400 units/h with some minor bleeding overnight.  Patient will probably need mediastinal washout tomorrow and with TEE the ECMO flow can be gradually reduced to assess biventricular ventricular function.  Based on previous echo studies it  is probable patient will need to transition Impella 5.5 support but it has been several days since his last echo study.  Objective: Vital signs in last 24 hours: Temp:  [96.4 F (35.8 C)-97.2 F (36.2 C)] 96.4 F (35.8 C) (08/27 0915) Pulse Rate:  [40-67] 55 (08/27 0915) Cardiac Rhythm: Sinus bradycardia (08/27 0800) Resp:  [0-20] 16 (08/27 0915) BP: (157)/(80) 157/80 (08/26 1535) SpO2:  [99 %-100 %] 100 % (08/27 0915) Arterial Line BP: (101-164)/(55-87) 105/56 (08/27 0915) FiO2 (%):  [21 %] 21 % (08/27 0800)  Hemodynamic parameters for last 24 hours: CVP:  [5 mmHg-69 mmHg] 12 mmHg  Intake/Output from previous day: 08/26 0701 - 08/27 0700 In: 3117.6 [I.V.:1547.7; NG/GT:1010; IV Piggyback:499.9] Out: 3035 [Urine:1765; Stool:820; Chest Tube:450] Intake/Output this shift: Total I/O In: 246.3 [I.V.:116.3; NG/GT:130] Out: 215 [Urine:165; Chest Tube:50]  Exam Patient sedated Breath sounds coarse Mild peripheral edema Some fullness developing underneath the [place] dressing Warm extremities  Lab Results: Recent Labs    10/05/21 1700 10/05/21 2044 10/06/21 0401 10/06/21 0409 10/06/21 0609 10/06/21 0746 10/06/21 0850  WBC 9.3  --  9.7  --   --   --   --   HGB 8.7*   < > 8.4*   < >  --  7.8* 7.1*  HCT 24.8*   < > 24.6*   < >  --  23.0* 21.0*  PLT 72*  74*  --  74*  --  69*  --   --     < > = values in this interval not displayed.   BMET:  Recent Labs    10/05/21 0441 10/05/21 0451 10/05/21 1857 10/05/21 2044 10/06/21 0746 10/06/21 0850  NA 144   < > 144   < > 146* 147*  K 3.6   < > 3.6   < > 3.5 3.6  CL 111  --  114*  --   --  109  CO2 25  --  25  --   --   --   GLUCOSE 178*  --  142*  --   --  146*  BUN 60*  --  65*  --   --  76*  CREATININE 2.82*  --  3.01*  --   --  3.10*  CALCIUM 7.3*  --  7.3*  --   --   --    < > = values in this interval not displayed.    PT/INR:  Recent Labs    10/06/21 0609  LABPROT 18.0*  INR 1.5*   ABG    Component Value Date/Time   PHART 7.462 (H) 10/06/2021 0746   HCO3 31.6 (H) 10/06/2021 0746   TCO2 29 10/06/2021 0850   ACIDBASEDEF 5.0 (H) 09/29/2021 1443   O2SAT 100 10/06/2021 0746   CBG (last 3)  Recent Labs    10/05/21 2305 10/06/21 0300 10/06/21 0744  GLUCAP 154* 124* 108*    Assessment/Plan: S/P Procedure(s) (LRB): MEDIASTINAL WASHOUT (N/A) TRANSESOPHAGEAL ECHOCARDIOGRAM (TEE) (  N/A) APPLICATION OF WOUND VAC (N/A) Continue Lasix to remove fluid-today patient will be started on Lasix drip Mediastinal cultures remain no growth from last washout 4 days ago   LOS: 8 days    Dahlia Byes 10/06/2021

## 2021-10-06 NOTE — Progress Notes (Signed)
ANTICOAGULATION CONSULT NOTE  Pharmacy Consult for heparin Indication:  ECMO circuit  No Known Allergies  Patient Measurements: Height: 5\' 7"  (170.2 cm) Weight: 108.4 kg (238 lb 15.7 oz) IBW/kg (Calculated) : 66.1 Heparin Dosing Weight: 88kg  Vital Signs: Temp: 96.4 F (35.8 C) (08/27 1500) Temp Source: Bladder (08/27 0800) BP: 121/65 (08/27 1455) Pulse Rate: 52 (08/27 1500)  Labs: Recent Labs    10/05/21 0441 10/05/21 0451 10/05/21 1700 10/05/21 1857 10/05/21 2044 10/06/21 0401 10/06/21 0409 10/06/21 0609 10/06/21 0746 10/06/21 0850 10/06/21 1513 10/06/21 1600  HGB 9.6*   < > 8.7*  --    < > 8.4*   < >  --    < > 7.1* 6.1* 7.7*  HCT 27.8*   < > 24.8*  --    < > 24.6*   < >  --    < > 21.0* 18.0* 22.7*  PLT 89*  87*  --  72*  74*  --   --  74*  --  69*  --   --   --  72*  APTT 40*  41*  --  48*  47*  --   --  38*  --  39*  --   --   --  44*  LABPROT 19.2*  18.7*  --  19.0*  --   --  18.2*  --  18.0*  --   --   --   --   INR 1.6*  1.6*  --  1.6*  --   --  1.5*  --  1.5*  --   --   --   --   HEPARINUNFRC  --   --  0.19*  --   --  0.15*  --   --   --   --   --  <0.10*  CREATININE 2.82*  --   --  3.01*  --   --   --   --   --  3.10*  --   --    < > = values in this interval not displayed.     Estimated Creatinine Clearance: 29.4 mL/min (A) (by C-G formula based on SCr of 3.1 mg/dL (H)).   Medical History: Past Medical History:  Diagnosis Date   AKI (acute kidney injury) ([place])    pt unaware of this   Anginal pain ([place])    CAD (coronary artery disease)    a. 03/2015 NSTEMI: LHC with severe 3V CAD  (70% mid RCA, 95% OM1, 90% distal LCx, 90% OM3, 80% prox LAD and 90% ost D1) s/p DES to mLAD w/ small dissction Rx with DES, staged ost Ramus PCI/DES and dLCx s/p PCI/DES    Chest pain 12/24/2020   Diabetes mellitus type 2 in obese [facility])    Diverticulosis    Dyspnea    Dyspnea on exertion 03/16/2015   Dyspnea on exertion   Family history of adverse reaction to  anesthesia    patient father- pt states after anesthesia his father "developed dementia"   GERD (gastroesophageal reflux disease)    Hypercholesteremia    Hypertension associated with diabetes ([place]) 03/16/2015   hypertension   NSTEMI (non-ST elevated myocardial infarction) ([place]) 03/17/2015   Obesity    Stroke ([place]) 2022   pt states he had a "mini stroke" during cardiac catheterization   Tobacco abuse     Assessment: 61 year old male with history of CABG 8/3, admitted for VF arrest 8/20 and patient found to have large pericardial  clot/effusion. A pericardial window was later performed ultimately required ECMO cannulation. Patient has received numerous blood/factor products due to bleeding. Bleeding improving this am, discussed with ECMO team and will start low dose heparin to preserve circuit.   Heparin held overnight with bleeding, resumed at 400 units/h this morning. Heparin level is <0.1, aPTT 44 seconds. CBC is low but stable, red urine persists and minor bleeding at cannula sites as well. Will hold off on titrating heparin for now with plans to go back to OR in am.  Goal of Therapy:  Heparin level 0.1-0.25 units/ml aPTT 45-60 seconds Monitor platelets by anticoagulation protocol: Yes   Plan:  Heparin 400 units/h Continue q12h coag checks  Arrie Senate, PharmD, BCPS, [facility] Clinical Pharmacist [phone] Please check AMION for all [place] numbers 10/06/2021

## 2021-10-06 NOTE — Progress Notes (Signed)
Pharmacy Antibiotic Note  [name] is a 61 y.o. male admitted on 09/28/2021 s/p pericardial window > now with open chest/ECMO  Pharmacy has been consulted for vancomycin and meropenem dosing.  Vancomycin trough is elevated at 26 mcg/ml - drawn slightly early but true trough likely ~ 24 mcg/ml. Cr remains elevated and continues to rise.   Plan: Hold vancomycin, check Cr in am and consider vancomycin random 48h after last dose (tomorrow ~1600)  Height: 5\' 7"  (170.2 cm) Weight: 108.4 kg (238 lb 15.7 oz) IBW/kg (Calculated) : 66.1  Temp (24hrs), Avg:96.9 F (36.1 C), Min:96.3 F (35.7 C), Max:97.2 F (36.2 C)  Recent Labs  Lab 09/29/21 1719 09/29/21 1922 09/29/21 1956 09/30/21 0933 09/30/21 1140 09/30/21 1650 10/01/21 0430 10/01/21 1616 10/02/21 1208 10/02/21 1607 10/04/21 0256 10/04/21 0500 10/04/21 1600 10/05/21 0030 10/05/21 0441 10/05/21 1700 10/05/21 1857 10/06/21 0401 10/06/21 0850 10/06/21 1600  WBC  --   --    < >  --  12.0*   < > 16.0*   < >  --    < > 11.9*   < > 10.4 10.6* 10.4 9.3  --  9.7  --  9.8  CREATININE 3.05*  --    < >  --  2.48*   < > 2.47*   < >  --    < > 2.47*  --  2.88*  --  2.82*  --  3.01*  --  3.10*  --   LATICACIDVEN 4.6* 2.3*  --   --  1.4  --  1.2  --   --   --  1.0  --   --   --   --   --   --   --   --   --   VANCOTROUGH  --   --   --   --   --   --   --   --  21*  --   --   --   --   --   --   --   --   --   --  61*  VANCORANDOM  --   --   --  16  --   --   --   --   --   --   --   --   --   --   --   --   --   --   --   --    < > = values in this interval not displayed.     Estimated Creatinine Clearance: 29.4 mL/min (A) (by C-G formula based on SCr of 3.1 mg/dL (H)).    No Known Allergies  Antimicrobials this admission:  8/20 Vancomycin > 8/20 Meropenem >   Dose adjustments this admission:  8/23 VT 21  Microbiology results:  8/24 wound: ngtd   Arrie Senate, PharmD, BCPS, [facility] Clinical Pharmacist [phone] Please  check AMION for all [place] numbers 10/06/2021

## 2021-10-06 NOTE — Progress Notes (Signed)
[place] MD notified of inadequate urine output with lasix bolus and drip. Orders received to increase lasix gtt to 12.

## 2021-10-07 ENCOUNTER — Inpatient Hospital Stay (HOSPITAL_COMMUNITY): Payer: Self-pay | Admitting: Certified Registered"

## 2021-10-07 ENCOUNTER — Inpatient Hospital Stay (HOSPITAL_COMMUNITY): Payer: Self-pay

## 2021-10-07 ENCOUNTER — Encounter (HOSPITAL_COMMUNITY)
Admission: EM | Disposition: A | Discharge status: Skilled Nursing Facility | Payer: Self-pay | Source: Home / Self Care | Attending: Surgery

## 2021-10-07 DIAGNOSIS — E119 Type 2 diabetes mellitus without complications: Secondary | ICD-10-CM

## 2021-10-07 DIAGNOSIS — Z95811 Presence of heart assist device: Secondary | ICD-10-CM

## 2021-10-07 DIAGNOSIS — Z9911 Dependence on respirator [ventilator] status: Secondary | ICD-10-CM

## 2021-10-07 DIAGNOSIS — Z7984 Long term (current) use of oral hypoglycemic drugs: Secondary | ICD-10-CM

## 2021-10-07 DIAGNOSIS — R570 Cardiogenic shock: Secondary | ICD-10-CM

## 2021-10-07 DIAGNOSIS — Z87891 Personal history of nicotine dependence: Secondary | ICD-10-CM

## 2021-10-07 DIAGNOSIS — I509 Heart failure, unspecified: Secondary | ICD-10-CM

## 2021-10-07 DIAGNOSIS — Z794 Long term (current) use of insulin: Secondary | ICD-10-CM

## 2021-10-07 DIAGNOSIS — Z9281 Personal history of extracorporeal membrane oxygenation (ECMO): Secondary | ICD-10-CM

## 2021-10-07 HISTORY — PX: TEE WITHOUT CARDIOVERSION: SHX5443

## 2021-10-07 HISTORY — PX: PLACEMENT OF IMPELLA LEFT VENTRICULAR ASSIST DEVICE: SHX6519

## 2021-10-07 LAB — POCT I-STAT 7, (LYTES, BLD GAS, ICA,H+H)
Acid-Base Excess: 3 mmol/L — ABNORMAL HIGH (ref 0.0–2.0)
Acid-Base Excess: 2 mmol/L (ref 0.0–2.0)
Acid-Base Excess: 0 mmol/L (ref 0.0–2.0)
Acid-Base Excess: 2 mmol/L (ref 0.0–2.0)
Acid-Base Excess: 2 mmol/L (ref 0.0–2.0)
Acid-base deficit: 1 mmol/L (ref 0.0–2.0)
Acid-base deficit: 7 mmol/L — ABNORMAL HIGH (ref 0.0–2.0)
Acid-base deficit: 2 mmol/L (ref 0.0–2.0)
Acid-base deficit: 1 mmol/L (ref 0.0–2.0)
Bicarbonate: 28.6 mmol/L — ABNORMAL HIGH (ref 20.0–28.0)
Bicarbonate: 28.7 mmol/L — ABNORMAL HIGH (ref 20.0–28.0)
Bicarbonate: 25.3 mmol/L (ref 20.0–28.0)
Bicarbonate: 24.5 mmol/L (ref 20.0–28.0)
Bicarbonate: 27.5 mmol/L (ref 20.0–28.0)
Bicarbonate: 27.6 mmol/L (ref 20.0–28.0)
Bicarbonate: 19.9 mmol/L — ABNORMAL LOW (ref 20.0–28.0)
Bicarbonate: 24.6 mmol/L (ref 20.0–28.0)
Bicarbonate: 24.2 mmol/L (ref 20.0–28.0)
Calcium, Ion: 1.24 mmol/L (ref 1.15–1.40)
Calcium, Ion: 1.25 mmol/L (ref 1.15–1.40)
Calcium, Ion: 1.17 mmol/L (ref 1.15–1.40)
Calcium, Ion: 1.05 mmol/L — ABNORMAL LOW (ref 1.15–1.40)
Calcium, Ion: 1.14 mmol/L — ABNORMAL LOW (ref 1.15–1.40)
Calcium, Ion: 1.14 mmol/L — ABNORMAL LOW (ref 1.15–1.40)
Calcium, Ion: 1.13 mmol/L — ABNORMAL LOW (ref 1.15–1.40)
Calcium, Ion: 1.16 mmol/L (ref 1.15–1.40)
Calcium, Ion: 1.08 mmol/L — ABNORMAL LOW (ref 1.15–1.40)
HCT: 24 % — ABNORMAL LOW (ref 39.0–52.0)
HCT: 27 % — ABNORMAL LOW (ref 39.0–52.0)
HCT: 28 % — ABNORMAL LOW (ref 39.0–52.0)
HCT: 26 % — ABNORMAL LOW (ref 39.0–52.0)
HCT: 24 % — ABNORMAL LOW (ref 39.0–52.0)
HCT: 24 % — ABNORMAL LOW (ref 39.0–52.0)
HCT: 27 % — ABNORMAL LOW (ref 39.0–52.0)
HCT: 29 % — ABNORMAL LOW (ref 39.0–52.0)
HCT: 24 % — ABNORMAL LOW (ref 39.0–52.0)
Hemoglobin: 8.2 g/dL — ABNORMAL LOW (ref 13.0–17.0)
Hemoglobin: 9.2 g/dL — ABNORMAL LOW (ref 13.0–17.0)
Hemoglobin: 9.5 g/dL — ABNORMAL LOW (ref 13.0–17.0)
Hemoglobin: 8.8 g/dL — ABNORMAL LOW (ref 13.0–17.0)
Hemoglobin: 8.2 g/dL — ABNORMAL LOW (ref 13.0–17.0)
Hemoglobin: 8.2 g/dL — ABNORMAL LOW (ref 13.0–17.0)
Hemoglobin: 9.2 g/dL — ABNORMAL LOW (ref 13.0–17.0)
Hemoglobin: 9.9 g/dL — ABNORMAL LOW (ref 13.0–17.0)
Hemoglobin: 8.2 g/dL — ABNORMAL LOW (ref 13.0–17.0)
O2 Saturation: 100 %
O2 Saturation: 100 %
O2 Saturation: 100 %
O2 Saturation: 100 %
O2 Saturation: 100 %
O2 Saturation: 100 %
O2 Saturation: 98 %
O2 Saturation: 85 %
O2 Saturation: 90 %
Patient temperature: 36.1
Patient temperature: 36
Patient temperature: 36.2
Potassium: 3.8 mmol/L (ref 3.5–5.1)
Potassium: 4 mmol/L (ref 3.5–5.1)
Potassium: 3.8 mmol/L (ref 3.5–5.1)
Potassium: 3.5 mmol/L (ref 3.5–5.1)
Potassium: 3.8 mmol/L (ref 3.5–5.1)
Potassium: 3.9 mmol/L (ref 3.5–5.1)
Potassium: 4.9 mmol/L (ref 3.5–5.1)
Potassium: 4.2 mmol/L (ref 3.5–5.1)
Potassium: 4.6 mmol/L (ref 3.5–5.1)
Sodium: 146 mmol/L — ABNORMAL HIGH (ref 135–145)
Sodium: 147 mmol/L — ABNORMAL HIGH (ref 135–145)
Sodium: 146 mmol/L — ABNORMAL HIGH (ref 135–145)
Sodium: 148 mmol/L — ABNORMAL HIGH (ref 135–145)
Sodium: 148 mmol/L — ABNORMAL HIGH (ref 135–145)
Sodium: 147 mmol/L — ABNORMAL HIGH (ref 135–145)
Sodium: 145 mmol/L (ref 135–145)
Sodium: 146 mmol/L — ABNORMAL HIGH (ref 135–145)
Sodium: 145 mmol/L (ref 135–145)
TCO2: 30 mmol/L (ref 22–32)
TCO2: 30 mmol/L (ref 22–32)
TCO2: 27 mmol/L (ref 22–32)
TCO2: 26 mmol/L (ref 22–32)
TCO2: 29 mmol/L (ref 22–32)
TCO2: 29 mmol/L (ref 22–32)
TCO2: 21 mmol/L — ABNORMAL LOW (ref 22–32)
TCO2: 26 mmol/L (ref 22–32)
TCO2: 26 mmol/L (ref 22–32)
pCO2 arterial: 48.2 mmHg — ABNORMAL HIGH (ref 32–48)
pCO2 arterial: 52 mmHg — ABNORMAL HIGH (ref 32–48)
pCO2 arterial: 43.3 mmHg (ref 32–48)
pCO2 arterial: 44 mmHg (ref 32–48)
pCO2 arterial: 47.2 mmHg (ref 32–48)
pCO2 arterial: 45.2 mmHg (ref 32–48)
pCO2 arterial: 44.8 mmHg (ref 32–48)
pCO2 arterial: 49.7 mmHg — ABNORMAL HIGH (ref 32–48)
pCO2 arterial: 42.8 mmHg (ref 32–48)
pH, Arterial: 7.378 (ref 7.35–7.45)
pH, Arterial: 7.349 — ABNORMAL LOW (ref 7.35–7.45)
pH, Arterial: 7.374 (ref 7.35–7.45)
pH, Arterial: 7.354 (ref 7.35–7.45)
pH, Arterial: 7.374 (ref 7.35–7.45)
pH, Arterial: 7.394 (ref 7.35–7.45)
pH, Arterial: 7.255 — ABNORMAL LOW (ref 7.35–7.45)
pH, Arterial: 7.298 — ABNORMAL LOW (ref 7.35–7.45)
pH, Arterial: 7.357 (ref 7.35–7.45)
pO2, Arterial: 301 mmHg — ABNORMAL HIGH (ref 83–108)
pO2, Arterial: 414 mmHg — ABNORMAL HIGH (ref 83–108)
pO2, Arterial: 328 mmHg — ABNORMAL HIGH (ref 83–108)
pO2, Arterial: 333 mmHg — ABNORMAL HIGH (ref 83–108)
pO2, Arterial: 332 mmHg — ABNORMAL HIGH (ref 83–108)
pO2, Arterial: 394 mmHg — ABNORMAL HIGH (ref 83–108)
pO2, Arterial: 132 mmHg — ABNORMAL HIGH (ref 83–108)
pO2, Arterial: 53 mmHg — ABNORMAL LOW (ref 83–108)
pO2, Arterial: 59 mmHg — ABNORMAL LOW (ref 83–108)

## 2021-10-07 LAB — CBC
HCT: 27.8 % — ABNORMAL LOW (ref 39.0–52.0)
HCT: 31.7 % — ABNORMAL LOW (ref 39.0–52.0)
Hemoglobin: 9.1 g/dL — ABNORMAL LOW (ref 13.0–17.0)
Hemoglobin: 10.6 g/dL — ABNORMAL LOW (ref 13.0–17.0)
MCH: 29.2 pg (ref 26.0–34.0)
MCH: 29.4 pg (ref 26.0–34.0)
MCHC: 32.7 g/dL (ref 30.0–36.0)
MCHC: 33.4 g/dL (ref 30.0–36.0)
MCV: 89.1 fL (ref 80.0–100.0)
MCV: 88.1 fL (ref 80.0–100.0)
Platelets: 71 10*3/uL — ABNORMAL LOW (ref 150–400)
Platelets: 135 10*3/uL — ABNORMAL LOW (ref 150–400)
RBC: 3.12 MIL/uL — ABNORMAL LOW (ref 4.22–5.81)
RBC: 3.6 MIL/uL — ABNORMAL LOW (ref 4.22–5.81)
RDW: 16.8 % — ABNORMAL HIGH (ref 11.5–15.5)
RDW: 16.5 % — ABNORMAL HIGH (ref 11.5–15.5)
WBC: 10.2 10*3/uL (ref 4.0–10.5)
WBC: 24 10*3/uL — ABNORMAL HIGH (ref 4.0–10.5)
nRBC: 0.2 % (ref 0.0–0.2)
nRBC: 0.2 % (ref 0.0–0.2)

## 2021-10-07 LAB — PROTIME-INR
INR: 1.4 — ABNORMAL HIGH (ref 0.8–1.2)
INR: 1.6 — ABNORMAL HIGH (ref 0.8–1.2)
Prothrombin Time: 17.2 seconds — ABNORMAL HIGH (ref 11.4–15.2)
Prothrombin Time: 18.7 seconds — ABNORMAL HIGH (ref 11.4–15.2)

## 2021-10-07 LAB — POCT I-STAT, CHEM 8
BUN: 76 mg/dL — ABNORMAL HIGH (ref 8–23)
BUN: 74 mg/dL — ABNORMAL HIGH (ref 8–23)
BUN: 66 mg/dL — ABNORMAL HIGH (ref 8–23)
BUN: 70 mg/dL — ABNORMAL HIGH (ref 8–23)
BUN: 74 mg/dL — ABNORMAL HIGH (ref 8–23)
BUN: 73 mg/dL — ABNORMAL HIGH (ref 8–23)
BUN: 70 mg/dL — ABNORMAL HIGH (ref 8–23)
Calcium, Ion: 1.26 mmol/L (ref 1.15–1.40)
Calcium, Ion: 1.16 mmol/L (ref 1.15–1.40)
Calcium, Ion: 1.07 mmol/L — ABNORMAL LOW (ref 1.15–1.40)
Calcium, Ion: 1.13 mmol/L — ABNORMAL LOW (ref 1.15–1.40)
Calcium, Ion: 1.15 mmol/L (ref 1.15–1.40)
Calcium, Ion: 1.15 mmol/L (ref 1.15–1.40)
Calcium, Ion: 1.09 mmol/L — ABNORMAL LOW (ref 1.15–1.40)
Chloride: 110 mmol/L (ref 98–111)
Chloride: 110 mmol/L (ref 98–111)
Chloride: 112 mmol/L — ABNORMAL HIGH (ref 98–111)
Chloride: 111 mmol/L (ref 98–111)
Chloride: 110 mmol/L (ref 98–111)
Chloride: 112 mmol/L — ABNORMAL HIGH (ref 98–111)
Chloride: 109 mmol/L (ref 98–111)
Creatinine, Ser: 3.6 mg/dL — ABNORMAL HIGH (ref 0.61–1.24)
Creatinine, Ser: 3.5 mg/dL — ABNORMAL HIGH (ref 0.61–1.24)
Creatinine, Ser: 3 mg/dL — ABNORMAL HIGH (ref 0.61–1.24)
Creatinine, Ser: 3.2 mg/dL — ABNORMAL HIGH (ref 0.61–1.24)
Creatinine, Ser: 3.4 mg/dL — ABNORMAL HIGH (ref 0.61–1.24)
Creatinine, Ser: 3.4 mg/dL — ABNORMAL HIGH (ref 0.61–1.24)
Creatinine, Ser: 3.6 mg/dL — ABNORMAL HIGH (ref 0.61–1.24)
Glucose, Bld: 121 mg/dL — ABNORMAL HIGH (ref 70–99)
Glucose, Bld: 131 mg/dL — ABNORMAL HIGH (ref 70–99)
Glucose, Bld: 125 mg/dL — ABNORMAL HIGH (ref 70–99)
Glucose, Bld: 128 mg/dL — ABNORMAL HIGH (ref 70–99)
Glucose, Bld: 132 mg/dL — ABNORMAL HIGH (ref 70–99)
Glucose, Bld: 203 mg/dL — ABNORMAL HIGH (ref 70–99)
Glucose, Bld: 274 mg/dL — ABNORMAL HIGH (ref 70–99)
HCT: 24 % — ABNORMAL LOW (ref 39.0–52.0)
HCT: 26 % — ABNORMAL LOW (ref 39.0–52.0)
HCT: 26 % — ABNORMAL LOW (ref 39.0–52.0)
HCT: 24 % — ABNORMAL LOW (ref 39.0–52.0)
HCT: 26 % — ABNORMAL LOW (ref 39.0–52.0)
HCT: 24 % — ABNORMAL LOW (ref 39.0–52.0)
HCT: 25 % — ABNORMAL LOW (ref 39.0–52.0)
Hemoglobin: 8.2 g/dL — ABNORMAL LOW (ref 13.0–17.0)
Hemoglobin: 8.8 g/dL — ABNORMAL LOW (ref 13.0–17.0)
Hemoglobin: 8.8 g/dL — ABNORMAL LOW (ref 13.0–17.0)
Hemoglobin: 8.2 g/dL — ABNORMAL LOW (ref 13.0–17.0)
Hemoglobin: 8.8 g/dL — ABNORMAL LOW (ref 13.0–17.0)
Hemoglobin: 8.2 g/dL — ABNORMAL LOW (ref 13.0–17.0)
Hemoglobin: 8.5 g/dL — ABNORMAL LOW (ref 13.0–17.0)
Potassium: 4.1 mmol/L (ref 3.5–5.1)
Potassium: 3.8 mmol/L (ref 3.5–5.1)
Potassium: 3.5 mmol/L (ref 3.5–5.1)
Potassium: 3.7 mmol/L (ref 3.5–5.1)
Potassium: 3.9 mmol/L (ref 3.5–5.1)
Potassium: 4.9 mmol/L (ref 3.5–5.1)
Potassium: 4.6 mmol/L (ref 3.5–5.1)
Sodium: 147 mmol/L — ABNORMAL HIGH (ref 135–145)
Sodium: 146 mmol/L — ABNORMAL HIGH (ref 135–145)
Sodium: 148 mmol/L — ABNORMAL HIGH (ref 135–145)
Sodium: 147 mmol/L — ABNORMAL HIGH (ref 135–145)
Sodium: 146 mmol/L — ABNORMAL HIGH (ref 135–145)
Sodium: 144 mmol/L (ref 135–145)
Sodium: 145 mmol/L (ref 135–145)
TCO2: 27 mmol/L (ref 22–32)
TCO2: 28 mmol/L (ref 22–32)
TCO2: 24 mmol/L (ref 22–32)
TCO2: 26 mmol/L (ref 22–32)
TCO2: 27 mmol/L (ref 22–32)
TCO2: 21 mmol/L — ABNORMAL LOW (ref 22–32)
TCO2: 22 mmol/L (ref 22–32)

## 2021-10-07 LAB — HEPATIC FUNCTION PANEL
ALT: 15 U/L (ref 0–44)
AST: 97 U/L — ABNORMAL HIGH (ref 15–41)
Albumin: 1.9 g/dL — ABNORMAL LOW (ref 3.5–5.0)
Alkaline Phosphatase: 89 U/L (ref 38–126)
Bilirubin, Direct: 0.6 mg/dL — ABNORMAL HIGH (ref 0.0–0.2)
Indirect Bilirubin: 0.5 mg/dL (ref 0.3–0.9)
Total Bilirubin: 1.1 mg/dL (ref 0.3–1.2)
Total Protein: 4.1 g/dL — ABNORMAL LOW (ref 6.5–8.1)

## 2021-10-07 LAB — GLOBAL TEG PANEL
CFF Max Amplitude: 19.6 mm (ref 15–32)
CK with Heparinase (R): 10.7 min — ABNORMAL HIGH (ref 4.3–8.3)
Citrated Functional Fibrinogen: 357.7 mg/dL (ref 278–581)
Citrated Kaolin (K): UNDETERMINED min (ref 0.8–2.1)
Citrated Kaolin (MA): UNDETERMINED mm (ref 52–69)
Citrated Kaolin (R): >17 min — ABNORMAL HIGH (ref 4.6–9.1)
Citrated Kaolin Angle: <39 deg — ABNORMAL LOW (ref 63–78)
Citrated Rapid TEG (MA): 64.4 mm (ref 52–70)

## 2021-10-07 LAB — GLUCOSE, CAPILLARY
Glucose-Capillary: 132 mg/dL — ABNORMAL HIGH (ref 70–99)
Glucose-Capillary: 233 mg/dL — ABNORMAL HIGH (ref 70–99)
Glucose-Capillary: 274 mg/dL — ABNORMAL HIGH (ref 70–99)

## 2021-10-07 LAB — APTT
aPTT: 46 seconds — ABNORMAL HIGH (ref 24–36)
aPTT: 124 seconds — ABNORMAL HIGH (ref 24–36)

## 2021-10-07 LAB — BASIC METABOLIC PANEL
Anion gap: 8 (ref 5–15)
Anion gap: 13 (ref 5–15)
BUN: 78 mg/dL — ABNORMAL HIGH (ref 8–23)
BUN: 75 mg/dL — ABNORMAL HIGH (ref 8–23)
CO2: 25 mmol/L (ref 22–32)
CO2: 22 mmol/L (ref 22–32)
Calcium: 8.2 mg/dL — ABNORMAL LOW (ref 8.9–10.3)
Calcium: 8.2 mg/dL — ABNORMAL LOW (ref 8.9–10.3)
Chloride: 115 mmol/L — ABNORMAL HIGH (ref 98–111)
Chloride: 111 mmol/L (ref 98–111)
Creatinine, Ser: 3.4 mg/dL — ABNORMAL HIGH (ref 0.61–1.24)
Creatinine, Ser: 3.47 mg/dL — ABNORMAL HIGH (ref 0.61–1.24)
GFR, Estimated: 20 mL/min — ABNORMAL LOW (ref >60)
GFR, Estimated: 19 mL/min — ABNORMAL LOW (ref >60)
Glucose, Bld: 135 mg/dL — ABNORMAL HIGH (ref 70–99)
Glucose, Bld: 236 mg/dL — ABNORMAL HIGH (ref 70–99)
Potassium: 3.8 mmol/L (ref 3.5–5.1)
Potassium: 4.2 mmol/L (ref 3.5–5.1)
Sodium: 148 mmol/L — ABNORMAL HIGH (ref 135–145)
Sodium: 146 mmol/L — ABNORMAL HIGH (ref 135–145)

## 2021-10-07 LAB — LACTIC ACID, PLASMA
Lactic Acid, Venous: 2.6 mmol/L (ref 0.5–1.9)
Lactic Acid, Venous: 2.2 mmol/L (ref 0.5–1.9)

## 2021-10-07 LAB — HEPARIN LEVEL (UNFRACTIONATED): Heparin Unfractionated: 0.13 IU/mL — ABNORMAL LOW (ref 0.30–0.70)

## 2021-10-07 LAB — HEMOGLOBIN AND HEMATOCRIT, BLOOD
HCT: 23.6 % — ABNORMAL LOW (ref 39.0–52.0)
Hemoglobin: 7.8 g/dL — ABNORMAL LOW (ref 13.0–17.0)

## 2021-10-07 LAB — LACTATE DEHYDROGENASE: LDH: 1299 U/L — ABNORMAL HIGH (ref 98–192)

## 2021-10-07 LAB — FIBRINOGEN: Fibrinogen: 526 mg/dL — ABNORMAL HIGH (ref 210–475)

## 2021-10-07 LAB — ECHO INTRAOPERATIVE TEE
Height: 67 in
Weight: 3823.66 oz

## 2021-10-07 LAB — PREPARE RBC (CROSSMATCH)

## 2021-10-07 LAB — CALCIUM, IONIZED
Calcium, Ionized, Serum: 4.5 mg/dL (ref 4.5–5.6)
Calcium, Ionized, Serum: 5 mg/dL (ref 4.5–5.6)

## 2021-10-07 LAB — VANCOMYCIN, RANDOM: Vancomycin Rm: 15 ug/mL

## 2021-10-07 SURGERY — INSERTION, CARDIAC ASSIST DEVICE, IMPELLA
Anesthesia: General | Site: Chest

## 2021-10-07 MED ORDER — FENTANYL CITRATE (PF) 250 MCG/5ML IJ SOLN
INTRAMUSCULAR | Status: AC
  Filled 2021-10-07: qty 5

## 2021-10-07 MED ORDER — CALCIUM CHLORIDE 10 % IV SOLN
1.0000 g | Freq: Once | INTRAVENOUS | Status: AC
  Administered 2021-10-07: 1 g via INTRAVENOUS

## 2021-10-07 MED ORDER — ORAL CARE MOUTH RINSE: 15.0000 mL | OROMUCOSAL | Status: DC | PRN

## 2021-10-07 MED ORDER — ACETAMINOPHEN 160 MG/5ML PO SOLN
1000.0000 mg | Freq: Four times a day (QID) | ORAL | Status: DC
  Administered 2021-10-08 – 2021-10-11 (×7): 1000 mg
  Filled 2021-10-07 (×7): qty 40.6

## 2021-10-07 MED ORDER — CALCIUM GLUCONATE 10 % IV SOLN
1.0000 g | Freq: Once | INTRAVENOUS | Status: DC
Start: 2021-10-07 — End: 2021-10-07

## 2021-10-07 MED ORDER — LACTATED RINGERS IV SOLN: INTRAVENOUS | Status: DC

## 2021-10-07 MED ORDER — HEPARIN SODIUM (PORCINE) 5000 UNIT/ML IJ SOLN
INTRAMUSCULAR | Status: DC
Start: 2021-10-07 — End: 2021-10-07
  Filled 2021-10-07: qty 10

## 2021-10-07 MED ORDER — MAGNESIUM SULFATE 2 GM/50ML IV SOLN
2.0000 g | Freq: Once | INTRAVENOUS | Status: AC
Start: 2021-10-07 — End: 2021-10-07
  Administered 2021-10-07: 2 g via INTRAVENOUS
  Filled 2021-10-07: qty 50

## 2021-10-07 MED ORDER — ROCURONIUM BROMIDE 10 MG/ML (PF) SYRINGE
PREFILLED_SYRINGE | INTRAVENOUS | Status: AC
  Filled 2021-10-07: qty 20

## 2021-10-07 MED ORDER — POTASSIUM CHLORIDE 10 MEQ/50ML IV SOLN: 10.0000 meq | INTRAVENOUS | Status: AC

## 2021-10-07 MED ORDER — ROCURONIUM BROMIDE 10 MG/ML (PF) SYRINGE
PREFILLED_SYRINGE | INTRAVENOUS | Status: AC
  Filled 2021-10-07: qty 30

## 2021-10-07 MED ORDER — SODIUM CHLORIDE 0.9% IV SOLUTION: Freq: Once | INTRAVENOUS | Status: AC

## 2021-10-07 MED ORDER — MILRINONE LACTATE IN DEXTROSE 20-5 MG/100ML-% IV SOLN
0.1250 ug/kg/min | INTRAVENOUS | Status: DC
  Administered 2021-10-07 – 2021-10-08 (×3): 0.375 ug/kg/min via INTRAVENOUS
  Filled 2021-10-07 (×4): qty 100

## 2021-10-07 MED ORDER — SODIUM CHLORIDE 0.9 % FOR CRRT: INTRAVENOUS_CENTRAL | Status: DC | PRN

## 2021-10-07 MED ORDER — PRISMASOL BGK 4/2.5 32-4-2.5 MEQ/L EC SOLN: Status: DC

## 2021-10-07 MED ORDER — ALBUMIN HUMAN 5 % IV SOLN: 250.0000 mL | INTRAVENOUS | Status: AC | PRN

## 2021-10-07 MED ORDER — ALBUMIN HUMAN 5 % IV SOLN: INTRAVENOUS | Status: DC | PRN

## 2021-10-07 MED ORDER — VANCOMYCIN VARIABLE DOSE PER UNSTABLE RENAL FUNCTION (PHARMACIST DOSING): Status: DC

## 2021-10-07 MED ORDER — FENTANYL CITRATE (PF) 250 MCG/5ML IJ SOLN
INTRAMUSCULAR | Status: DC | PRN
  Administered 2021-10-07: 50 ug via INTRAVENOUS

## 2021-10-07 MED ORDER — SODIUM BICARBONATE 8.4 % IV SOLN
INTRAVENOUS | Status: DC | PRN
  Administered 2021-10-07 (×2): 50 meq via INTRAVENOUS

## 2021-10-07 MED ORDER — SODIUM CHLORIDE 0.9% FLUSH: 10.0000 mL | INTRAVENOUS | Status: DC | PRN

## 2021-10-07 MED ORDER — HEPARIN SODIUM (PORCINE) 1000 UNIT/ML IJ SOLN
INTRAMUSCULAR | Status: DC | PRN
  Administered 2021-10-07: 3000 [IU] via INTRAVENOUS
  Administered 2021-10-07: 5000 [IU] via INTRAVENOUS
  Administered 2021-10-07: 3000 [IU] via INTRAVENOUS

## 2021-10-07 MED ORDER — THROMBI-PAD 3"X3" EX PADS
1.0000 | MEDICATED_PAD | Freq: Once | CUTANEOUS | Status: AC
Start: 2021-10-07 — End: 2021-10-07
  Administered 2021-10-07: 1 via TOPICAL
  Filled 2021-10-07: qty 2

## 2021-10-07 MED ORDER — HEPARIN SODIUM (PORCINE) 1000 UNIT/ML DIALYSIS
1000.0000 [IU] | INTRAMUSCULAR | Status: DC | PRN
  Administered 2021-10-12: 2800 [IU] via INTRAVENOUS_CENTRAL
  Administered 2021-10-17: 2.8 [IU] via INTRAVENOUS_CENTRAL
  Administered 2021-10-18: 2800 [IU] via INTRAVENOUS_CENTRAL
  Filled 2021-10-07: qty 5
  Filled 2021-10-07: qty 6
  Filled 2021-10-07: qty 5
  Filled 2021-10-07 (×3): qty 6

## 2021-10-07 MED ORDER — GELATIN ABSORBABLE 12-7 MM EX MISC
1.0000 | Freq: Once | CUTANEOUS | Status: AC
  Administered 2021-10-07: 1 via TOPICAL
  Filled 2021-10-07: qty 1

## 2021-10-07 MED ORDER — ROCURONIUM BROMIDE 10 MG/ML (PF) SYRINGE
PREFILLED_SYRINGE | INTRAVENOUS | Status: AC
  Filled 2021-10-07: qty 10

## 2021-10-07 MED ORDER — ALBUMIN HUMAN 5 % IV SOLN
INTRAVENOUS | Status: AC
  Administered 2021-10-07: 12.5 g via INTRAVENOUS
  Filled 2021-10-07: qty 500

## 2021-10-07 MED ORDER — SODIUM CHLORIDE 0.9% IV SOLUTION: Freq: Once | INTRAVENOUS | Status: DC

## 2021-10-07 MED ORDER — HEMOSTATIC AGENTS (NO CHARGE) OPTIME
TOPICAL | Status: DC | PRN
  Administered 2021-10-07 (×2): 1 via TOPICAL

## 2021-10-07 MED ORDER — 0.9 % SODIUM CHLORIDE (POUR BTL) OPTIME
TOPICAL | Status: DC | PRN
  Administered 2021-10-07: 4000 mL

## 2021-10-07 MED ORDER — SODIUM CHLORIDE 0.9% FLUSH: 3.0000 mL | INTRAVENOUS | Status: DC | PRN

## 2021-10-07 MED ORDER — MILRINONE LACTATE IN DEXTROSE 20-5 MG/100ML-% IV SOLN
INTRAVENOUS | Status: DC | PRN
  Administered 2021-10-07: .25 ug/kg/min via INTRAVENOUS

## 2021-10-07 MED ORDER — SODIUM CHLORIDE 0.45 % IV SOLN: INTRAVENOUS | Status: DC | PRN

## 2021-10-07 MED ORDER — PRISMASOL BGK 4/2.5 32-4-2.5 MEQ/L REPLACEMENT SOLN: Status: DC

## 2021-10-07 MED ORDER — ACETAMINOPHEN 160 MG/5ML PO SOLN
650.0000 mg | Freq: Once | ORAL | Status: DC
  Filled 2021-10-07: qty 20.3

## 2021-10-07 MED ORDER — CALCIUM CHLORIDE 10 % IV SOLN
INTRAVENOUS | Status: DC | PRN
  Administered 2021-10-07 (×4): 100 mg via INTRAVENOUS

## 2021-10-07 MED ORDER — AMIODARONE HCL IN DEXTROSE 360-4.14 MG/200ML-% IV SOLN
60.0000 mg/h | INTRAVENOUS | Status: DC
  Administered 2021-10-07: 60 mg/h via INTRAVENOUS
  Filled 2021-10-07: qty 200

## 2021-10-07 MED ORDER — SODIUM CHLORIDE 0.9% FLUSH
10.0000 mL | Freq: Two times a day (BID) | INTRAVENOUS | Status: DC
  Administered 2021-10-07: 30 mL
  Administered 2021-10-08 – 2021-10-13 (×10): 10 mL
  Administered 2021-10-14: 30 mL
  Administered 2021-10-14: 10 mL
  Administered 2021-10-15: 30 mL
  Administered 2021-10-15 – 2021-10-18 (×6): 10 mL
  Administered 2021-10-19: 25 mL
  Administered 2021-10-19: 40 mL
  Administered 2021-10-20 – 2021-11-01 (×18): 10 mL

## 2021-10-07 MED ORDER — AMIODARONE HCL IN DEXTROSE 360-4.14 MG/200ML-% IV SOLN
30.0000 mg/h | INTRAVENOUS | Status: DC
  Administered 2021-10-08 – 2021-10-17 (×37): 60 mg/h via INTRAVENOUS
  Administered 2021-10-18: 30 mg/h via INTRAVENOUS
  Administered 2021-10-18: 60 mg/h via INTRAVENOUS
  Administered 2021-10-19 – 2021-10-22 (×7): 30 mg/h via INTRAVENOUS
  Filled 2021-10-07 (×25): qty 200
  Filled 2021-10-07: qty 400
  Filled 2021-10-07 (×3): qty 200
  Filled 2021-10-07: qty 400
  Filled 2021-10-07 (×12): qty 200
  Filled 2021-10-07: qty 400

## 2021-10-07 MED ORDER — ASPIRIN 325 MG PO TBEC: 325.0000 mg | DELAYED_RELEASE_TABLET | Freq: Every day | ORAL | Status: DC

## 2021-10-07 MED ORDER — HEPARIN 6000 UNIT IRRIGATION SOLUTION
Status: DC | PRN
  Administered 2021-10-07: 1

## 2021-10-07 MED ORDER — AMIODARONE IV BOLUS ONLY 150 MG/100ML
150.0000 mg | Freq: Once | INTRAVENOUS | Status: AC
  Administered 2021-10-07: 150 mg via INTRAVENOUS

## 2021-10-07 MED ORDER — ACETAMINOPHEN 650 MG RE SUPP: 650.0000 mg | Freq: Once | RECTAL | Status: DC

## 2021-10-07 MED ORDER — PROPOFOL 10 MG/ML IV BOLUS
INTRAVENOUS | Status: AC
  Filled 2021-10-07: qty 20

## 2021-10-07 MED ORDER — NITROGLYCERIN IN D5W 200-5 MCG/ML-% IV SOLN: 0.0000 ug/min | INTRAVENOUS | Status: DC

## 2021-10-07 MED ORDER — ACETAMINOPHEN 500 MG PO TABS
1000.0000 mg | ORAL_TABLET | Freq: Four times a day (QID) | ORAL | Status: DC
  Administered 2021-10-11: 1000 mg via ORAL
  Filled 2021-10-07: qty 2

## 2021-10-07 MED ORDER — EPINEPHRINE 1 MG/10ML IJ SOSY
PREFILLED_SYRINGE | INTRAMUSCULAR | Status: DC | PRN
  Administered 2021-10-07: 10 ug via INTRAVENOUS
  Administered 2021-10-07: 50 ug via INTRAVENOUS
  Administered 2021-10-07 (×2): 20 ug via INTRAVENOUS

## 2021-10-07 MED ORDER — HEPARIN 6000 UNIT IRRIGATION SOLUTION
Status: AC
  Filled 2021-10-07: qty 500

## 2021-10-07 MED ORDER — CHLORHEXIDINE GLUCONATE 0.12 % MT SOLN: 15.0000 mL | OROMUCOSAL | Status: AC

## 2021-10-07 MED ORDER — SODIUM CHLORIDE 0.45 % IV SOLN
INTRAVENOUS | Status: DC
  Filled 2021-10-07 (×4): qty 75

## 2021-10-07 MED ORDER — SODIUM CHLORIDE 0.9 % IV SOLN
1.0000 g | Freq: Three times a day (TID) | INTRAVENOUS | Status: DC
  Administered 2021-10-08 – 2021-10-18 (×31): 1 g via INTRAVENOUS
  Filled 2021-10-07 (×34): qty 20

## 2021-10-07 MED ORDER — VASOPRESSIN 20 UNITS/100 ML INFUSION FOR SHOCK
INTRAVENOUS | Status: AC
  Administered 2021-10-07: 0.04 [IU]/min via INTRAVENOUS
  Filled 2021-10-07: qty 100

## 2021-10-07 MED ORDER — AMIODARONE HCL IN DEXTROSE 360-4.14 MG/200ML-% IV SOLN
30.0000 mg/h | INTRAVENOUS | Status: DC
  Filled 2021-10-07: qty 200

## 2021-10-07 MED ORDER — TRANEXAMIC ACID-NACL 1000-0.7 MG/100ML-% IV SOLN
1000.0000 mg | INTRAVENOUS | Status: AC
  Administered 2021-10-07: 1000 mg via INTRAVENOUS
  Filled 2021-10-07: qty 100

## 2021-10-07 MED ORDER — SODIUM BICARBONATE 8.4 % IV SOLN
INTRAVENOUS | Status: DC
  Filled 2021-10-07 (×3): qty 25

## 2021-10-07 MED ORDER — INSULIN ASPART 100 UNIT/ML IJ SOLN
0.0000 [IU] | INTRAMUSCULAR | Status: DC
  Administered 2021-10-07 – 2021-10-08 (×2): 5 [IU] via SUBCUTANEOUS
  Administered 2021-10-08: 3 [IU] via SUBCUTANEOUS
  Administered 2021-10-08: 7 [IU] via SUBCUTANEOUS
  Administered 2021-10-08 (×2): 5 [IU] via SUBCUTANEOUS
  Administered 2021-10-08: 3 [IU] via SUBCUTANEOUS
  Administered 2021-10-09 (×3): 7 [IU] via SUBCUTANEOUS

## 2021-10-07 MED ORDER — SODIUM CHLORIDE 0.9% FLUSH
3.0000 mL | Freq: Two times a day (BID) | INTRAVENOUS | Status: DC
  Administered 2021-10-08 – 2021-12-03 (×92): 3 mL via INTRAVENOUS

## 2021-10-07 MED ORDER — VANCOMYCIN HCL IN DEXTROSE 1-5 GM/200ML-% IV SOLN
1000.0000 mg | INTRAVENOUS | Status: DC
  Administered 2021-10-07 – 2021-10-17 (×11): 1000 mg via INTRAVENOUS
  Filled 2021-10-07 (×11): qty 200

## 2021-10-07 MED ORDER — ALBUMIN HUMAN 5 % IV SOLN
12.5000 g | INTRAVENOUS | Status: DC | PRN
  Administered 2021-10-15 – 2021-11-01 (×3): 12.5 g via INTRAVENOUS
  Filled 2021-10-07 (×2): qty 250

## 2021-10-07 MED ORDER — ASPIRIN 81 MG PO CHEW: 324.0000 mg | CHEWABLE_TABLET | Freq: Every day | ORAL | Status: DC

## 2021-10-07 MED ORDER — DEXTROSE 50 % IV SOLN: 0.0000 mL | INTRAVENOUS | Status: DC | PRN

## 2021-10-07 MED ORDER — SODIUM CHLORIDE 0.9 % IV SOLN: 250.0000 mL | INTRAVENOUS | Status: DC

## 2021-10-07 MED ORDER — VASOPRESSIN 20 UNITS/100 ML INFUSION FOR SHOCK
0.0000 [IU]/min | INTRAVENOUS | Status: DC
  Administered 2021-10-07 – 2021-10-09 (×6): 0.04 [IU]/min via INTRAVENOUS
  Administered 2021-10-10: 0.03 [IU]/min via INTRAVENOUS
  Administered 2021-10-10: 0.01 [IU]/min via INTRAVENOUS
  Administered 2021-10-12: 0.02 [IU]/min via INTRAVENOUS
  Administered 2021-10-13 – 2021-10-14 (×6): 0.04 [IU]/min via INTRAVENOUS
  Administered 2021-10-15: 0.03 [IU]/min via INTRAVENOUS
  Filled 2021-10-07 (×5): qty 100
  Filled 2021-10-07: qty 200
  Filled 2021-10-07 (×11): qty 100

## 2021-10-07 MED ORDER — ONDANSETRON HCL 4 MG/2ML IJ SOLN: 4.0000 mg | Freq: Four times a day (QID) | INTRAMUSCULAR | Status: DC | PRN

## 2021-10-07 MED ORDER — SODIUM CHLORIDE 0.9 % IV SOLN: INTRAVENOUS | Status: DC

## 2021-10-07 MED ORDER — ROCURONIUM BROMIDE 10 MG/ML (PF) SYRINGE
PREFILLED_SYRINGE | INTRAVENOUS | Status: DC | PRN
  Administered 2021-10-07: 70 mg via INTRAVENOUS
  Administered 2021-10-07: 30 mg via INTRAVENOUS
  Administered 2021-10-07 (×4): 50 mg via INTRAVENOUS

## 2021-10-07 MED ORDER — ORAL CARE MOUTH RINSE
15.0000 mL | OROMUCOSAL | Status: DC
  Administered 2021-10-07 – 2021-10-21 (×150): 15 mL via OROMUCOSAL

## 2021-10-07 MED ORDER — CHLORHEXIDINE GLUCONATE CLOTH 2 % EX PADS
6.0000 | MEDICATED_PAD | Freq: Every day | CUTANEOUS | Status: DC
  Administered 2021-10-07 – 2021-11-17 (×45): 6 via TOPICAL

## 2021-10-07 SURGICAL SUPPLY — 103 items
ADH SKN CLS APL DERMABOND .7 (GAUZE/BANDAGES/DRESSINGS) ×2
ANCHOR CATH FOLEY SECURE (MISCELLANEOUS) IMPLANT
APL SRG 7X2 LUM MLBL SLNT (VASCULAR PRODUCTS)
APPLICATOR TIP COSEAL (VASCULAR PRODUCTS) IMPLANT
BANDAGE ESMARK 6X9 LF (GAUZE/BANDAGES/DRESSINGS) IMPLANT
BLADE 11 SAFETY STRL DISP (BLADE) IMPLANT
BLADE CLIPPER SURG (BLADE) ×2 IMPLANT
BNDG CMPR 9X4 STRL LF SNTH (GAUZE/BANDAGES/DRESSINGS)
BNDG CMPR 9X6 STRL LF SNTH (GAUZE/BANDAGES/DRESSINGS) ×2
BNDG ESMARK 4X9 LF (GAUZE/BANDAGES/DRESSINGS) IMPLANT
BNDG ESMARK 6X9 LF (GAUZE/BANDAGES/DRESSINGS) ×2
BRUSH SCRUB EZ PLAIN DRY (MISCELLANEOUS) IMPLANT
CATH 5FR JL4 DIAGNOSTIC (CATHETERS) IMPLANT
CATH ACCU-VU SIZ PIG 5F 100CM (CATHETERS) IMPLANT
CATH DIAG EXPO 6F AL1 (CATHETERS) ×2 IMPLANT
CATH DIAG EXPO 6F FR4 (CATHETERS) IMPLANT
CATH INFINITI 6F MPB2 (CATHETERS) ×2 IMPLANT
CATH OMNI FLUSH 5F 65CM (CATHETERS) IMPLANT
CATH THORACIC 36FR RT ANG (CATHETERS) IMPLANT
CLIP TI MEDIUM 24 (CLIP) IMPLANT
CLIP TI WIDE RED SMALL 24 (CLIP) IMPLANT
CONN ST 1/4X3/8  BEN (MISCELLANEOUS) ×4
CONN ST 1/4X3/8 BEN (MISCELLANEOUS) IMPLANT
CONN Y 3/8X3/8X3/8  BEN (MISCELLANEOUS) ×4
CONN Y 3/8X3/8X3/8 BEN (MISCELLANEOUS) IMPLANT
CONNECTOR STRAIGHT 3/8 (MISCELLANEOUS) IMPLANT
CONTAINER PROTECT SURGISLUSH (MISCELLANEOUS) ×2 IMPLANT
DERMABOND ADVANCED (GAUZE/BANDAGES/DRESSINGS) ×2
DERMABOND ADVANCED .7 DNX12 (GAUZE/BANDAGES/DRESSINGS) IMPLANT
DRAPE C-ARM 42X72 X-RAY (DRAPES) ×4 IMPLANT
DRAPE CV SPLIT W-CLR ANES SCRN (DRAPES) ×2 IMPLANT
DRAPE INCISE IOBAN 44X35 STRL (DRAPES) IMPLANT
DRAPE INCISE IOBAN 66X45 STRL (DRAPES) IMPLANT
DRAPE PERI GROIN 82X75IN TIB (DRAPES) ×2 IMPLANT
DRAPE WARM FLUID 44X44 (DRAPES) ×2 IMPLANT
EVACUATOR SILICONE 100CC (DRAIN) IMPLANT
GAUZE 4X4 16PLY ~~LOC~~+RFID DBL (SPONGE) ×2 IMPLANT
GAUZE SPONGE 4X4 12PLY STRL (GAUZE/BANDAGES/DRESSINGS) IMPLANT
GAUZE SPONGE 4X4 12PLY STRL LF (GAUZE/BANDAGES/DRESSINGS) IMPLANT
GLOVE BIO SURGEON STRL SZ 6.5 (GLOVE) IMPLANT
GLOVE BIOGEL M STRL SZ7.5 (GLOVE) ×6 IMPLANT
GLOVE BIOGEL PI IND STRL 6.5 (GLOVE) IMPLANT
GLOVE BIOGEL PI INDICATOR 6.5 (GLOVE) ×2
GLOVE ECLIPSE 7.0 STRL STRAW (GLOVE) IMPLANT
GLOVE ECLIPSE 7.5 STRL STRAW (GLOVE) IMPLANT
GLOVE SS BIOGEL STRL SZ 6 (GLOVE) IMPLANT
GLOVE SS BIOGEL STRL SZ 6.5 (GLOVE) IMPLANT
GLOVE SS BIOGEL STRL SZ 7 (GLOVE) IMPLANT
GLOVE SUPERSENSE BIOGEL SZ 6 (GLOVE) ×6
GLOVE SUPERSENSE BIOGEL SZ 6.5 (GLOVE) ×2
GLOVE SUPERSENSE BIOGEL SZ 7 (GLOVE) ×2
GOWN STRL REUS W/ TWL LRG LVL3 (GOWN DISPOSABLE) ×8 IMPLANT
GOWN STRL REUS W/ TWL XL LVL3 (GOWN DISPOSABLE) IMPLANT
GOWN STRL REUS W/TWL LRG LVL3 (GOWN DISPOSABLE) ×10
GOWN STRL REUS W/TWL XL LVL3 (GOWN DISPOSABLE) ×6
GRAFT CV 30X8WVN NDL (Graft) IMPLANT
GRAFT HEMASHIELD 8MM (Graft) ×2 IMPLANT
GUIDEWIRE ANGLED .035X260CM (WIRE) IMPLANT
INSERT FOGARTY SM (MISCELLANEOUS) ×2 IMPLANT
KIT BASIN OR (CUSTOM PROCEDURE TRAY) ×2 IMPLANT
KIT INSERTION AXILLARY (KITS) IMPLANT
LOOP VESSEL MAXI BLUE (MISCELLANEOUS) ×2 IMPLANT
LOOP VESSEL MINI RED (MISCELLANEOUS) ×2 IMPLANT
NS IRRIG 1000ML POUR BTL (IV SOLUTION) ×8 IMPLANT
PACK CHEST (CUSTOM PROCEDURE TRAY) ×2 IMPLANT
PAD ARMBOARD 7.5X6 YLW CONV (MISCELLANEOUS) ×4 IMPLANT
PAD ELECT DEFIB RADIOL ZOLL (MISCELLANEOUS) ×2 IMPLANT
PUMP SET IMPELLA 5.5 US (CATHETERS) ×2 IMPLANT
SEALANT SURG COSEAL 8ML (VASCULAR PRODUCTS) ×2 IMPLANT
SPONGE T-LAP 18X18 ~~LOC~~+RFID (SPONGE) ×8 IMPLANT
SPONGE T-LAP 4X18 ~~LOC~~+RFID (SPONGE) ×2 IMPLANT
STAPLER VISISTAT 35W (STAPLE) IMPLANT
SUT ETHILON 3 0 FSL (SUTURE) IMPLANT
SUT ETHILON 3 0 PS 1 (SUTURE) ×4 IMPLANT
SUT PROLENE 2 0 SH DA (SUTURE) IMPLANT
SUT PROLENE 5 0 C 1 24 (SUTURE) IMPLANT
SUT PROLENE 5 0 C 1 36 (SUTURE) IMPLANT
SUT PROLENE 5 0 C1 (SUTURE) ×4 IMPLANT
SUT SILK  1 MH (SUTURE) ×14
SUT SILK 1 MH (SUTURE) ×8 IMPLANT
SUT SILK 1 TIES 10X30 (SUTURE) ×2 IMPLANT
SUT SILK 3 0 SH CR/8 (SUTURE) IMPLANT
SUT SILK 3 0 TIES 17X18 (SUTURE) ×2
SUT SILK 3-0 18XBRD TIE BLK (SUTURE) ×2 IMPLANT
SUT VIC AB 2-0 CT1 18 (SUTURE) ×4 IMPLANT
SUT VIC AB 2-0 CT1 27 (SUTURE) ×2
SUT VIC AB 2-0 CT1 TAPERPNT 27 (SUTURE) IMPLANT
SUT VIC AB 3-0 SH 27 (SUTURE) ×2
SUT VIC AB 3-0 SH 27X BRD (SUTURE) IMPLANT
SUT VIC AB 3-0 X1 27 (SUTURE) ×2 IMPLANT
SYR 20ML ECCENTRIC (SYRINGE) IMPLANT
SYR 20ML LL LF (SYRINGE) IMPLANT
TAPE CLOTH SURG 4X10 WHT LF (GAUZE/BANDAGES/DRESSINGS) IMPLANT
TOWEL GREEN STERILE (TOWEL DISPOSABLE) ×2 IMPLANT
TOWEL GREEN STERILE FF (TOWEL DISPOSABLE) ×2 IMPLANT
TRAY FOLEY SLVR 16FR TEMP STAT (SET/KITS/TRAYS/PACK) ×2 IMPLANT
TUBE CONNECTING 20X1/4 (TUBING) IMPLANT
WATER STERILE IRR 1000ML POUR (IV SOLUTION) ×4 IMPLANT
WIRE AMPLATZ SS-J .035X260CM (WIRE) IMPLANT
WIRE EMERALD 3MM-J .035X260CM (WIRE) IMPLANT
WIRE HI TORQ VERSACORE J 260CM (WIRE) IMPLANT
WIRE HI TORQ VERSACORE-J 145CM (WIRE) IMPLANT
YANKAUER SUCT BULB TIP NO VENT (SUCTIONS) IMPLANT

## 2021-10-07 NOTE — Consult Note (Signed)
[facility] Nephrology Consultation Note  Requesting MD: Dr. Gilford Raid Reason for consult: AKI, volume management.   HPI:  ACEY WOODFIELD is a 61 y.o. male with history of HTN, HLD, DM, A-fib, stroke, CKD, CAD multivessel dz status post CABG on 8/3 presented with shortness of breath, found to have cardiac tamponade, seen as a consultation for the evaluation of AKI and fluid volume management.  On admission the patient was found to be bradycardic and hypoglycemia.  He received dextrose with improvement of symptoms however he was continued to have bradycardia and hypotension.  The initial echo with EF of 50% and grade 2 diastolic dysfunction and possible tamponade physiology.  He was taken to the OR on 8/19 for pericardial window.  Since then he was having tachybradycardia and telemetry associated with increased work of breathing and decreased urine output.  The left pleural chest tube was placed by the pulmonary team.  The patient became unresponsive and underwent PEA arrest.  He received multiple rounds of CPR with ROSC obtained and the patient was intubated. The patient also had ECMO and was followed by cardiologist, pulmonary critical care team. For CKD it seems like the baseline creatinine level is around 1.3-1.6.  On admission the creatinine level was 2.35.  He has multiple hemodynamic insults including cardiac arrest and surgeries.  Also received IV Lasix drip with good response.  The creatinine level continue to go up to 3.4 today.  Now the urine output is decreasing associated with massive fluid overload.  He is positive by around 14 L. He underwent insertion of Impella, decannulation of ECMO and mediastinal mass out by cardiothoracic surgeon today. Left groin temporary HD catheter was already placed by PCCM.  He is currently intubated, sedated.  Currently on amiodarone, epinephrine, hydromorphone, milrinone, Levophed and vasopressin.  PMHx:   Past Medical History:   Diagnosis Date   AKI (acute kidney injury) ([place])    pt unaware of this   Anginal pain ([place])    CAD (coronary artery disease)    a. 03/2015 NSTEMI: LHC with severe 3V CAD  (70% mid RCA, 95% OM1, 90% distal LCx, 90% OM3, 80% prox LAD and 90% ost D1) s/p DES to mLAD w/ small dissction Rx with DES, staged ost Ramus PCI/DES and dLCx s/p PCI/DES    Chest pain 12/24/2020   Diabetes mellitus type 2 in obese [facility])    Diverticulosis    Dyspnea    Dyspnea on exertion 03/16/2015   Dyspnea on exertion   Family history of adverse reaction to anesthesia    patient father- pt states after anesthesia his father "developed dementia"   GERD (gastroesophageal reflux disease)    Hypercholesteremia    Hypertension associated with diabetes ([place]) 03/16/2015   hypertension   NSTEMI (non-ST elevated myocardial infarction) ([place]) 03/17/2015   Obesity    Stroke ([place]) 2022   pt states he had a "mini stroke" during cardiac catheterization   Tobacco abuse     Past Surgical History:  Procedure Laterality Date   APPLICATION OF WOUND VAC N/A 10/03/2021   Procedure: APPLICATION OF WOUND VAC;  Surgeon: Dahlia Byes, MD;  Location: [place];  Service: Thoracic;  Laterality: N/A;   CANNULATION FOR ECMO (EXTRACORPOREAL MEMBRANE OXYGENATION) N/A 09/29/2021   Procedure: CANNULATION FOR ECMO (EXTRACORPOREAL MEMBRANE OXYGENATION);  Surgeon: Lajuana Matte, MD;  Location: [place];  Service: Open Heart Surgery;  Laterality: N/A;   CARDIAC CATHETERIZATION N/A 03/19/2015   Procedure: Left Heart Cath and Coronary Angiography;  Surgeon: Lorretta Harp, MD;  Location: [place] CV LAB;  Service: Cardiovascular;  Laterality: N/A;   CARDIAC CATHETERIZATION N/A 03/20/2015   Procedure: Coronary Stent Intervention;  Surgeon: Lorretta Harp, MD;  Location: [place] CV LAB;  Service: Cardiovascular;  Laterality: N/A;   CARDIAC CATHETERIZATION N/A 03/22/2015   Procedure: Coronary Stent Intervention;  Surgeon: Lorretta Harp,  MD;  Location: [place] CV LAB;  Service: Cardiovascular;  Laterality: N/A;   CORONARY ARTERY BYPASS GRAFT N/A 09/12/2021   Procedure: CORONARY ARTERY BYPASS GRAFTING (CABG) X 5 USING LEFT INTERNAL MAMMARY ARTERY AND ENDOSCOPICALLY HARVESTED RIGHT GREATER SAPHENOUS VEIN.;  Surgeon: [name], MD;  Location: [place];  Service: Open Heart Surgery;  Laterality: N/A;   EXPLORATION POST OPERATIVE OPEN HEART N/A 09/30/2021   Procedure: EXPLORATION POST OPERATIVE OPEN HEART WASHOUT;  Surgeon: Lajuana Matte, MD;  Location: [place];  Service: Open Heart Surgery;  Laterality: N/A;   LEFT HEART CATH AND CORONARY ANGIOGRAPHY N/A 12/25/2020   Procedure: LEFT HEART CATH AND CORONARY ANGIOGRAPHY;  Surgeon: Troy Sine, MD;  Location: [place] CV LAB;  Service: Cardiovascular;  Laterality: N/A;   LEFT HEART CATH AND CORONARY ANGIOGRAPHY N/A 12/26/2020   Procedure: LEFT HEART CATH AND CORONARY ANGIOGRAPHY;  Surgeon: Troy Sine, MD;  Location: [place] CV LAB;  Service: Cardiovascular;  Laterality: N/A;   MEDIASTINAL EXPLORATION N/A 10/03/2021   Procedure: MEDIASTINAL WASHOUT;  Surgeon: Dahlia Byes, MD;  Location: [place];  Service: Thoracic;  Laterality: N/A;  PUMP STANDBY   PERICARDIAL WINDOW Right 09/28/2021   Procedure: PERICARDIAL WINDOW;  Surgeon: Lajuana Matte, MD;  Location: [place];  Service: Thoracic;  Laterality: Right;  Right VATS.  Lazy lateral.  double lumen ET tube   TEE WITHOUT CARDIOVERSION N/A 09/12/2021   Procedure: TRANSESOPHAGEAL ECHOCARDIOGRAM (TEE);  Surgeon: [name], MD;  Location: [place];  Service: Open Heart Surgery;  Laterality: N/A;   TEE WITHOUT CARDIOVERSION N/A 09/29/2021   Procedure: TRANSESOPHAGEAL ECHOCARDIOGRAM (TEE);  Surgeon: Lajuana Matte, MD;  Location: [place];  Service: Open Heart Surgery;  Laterality: N/A;   TEE WITHOUT CARDIOVERSION N/A 10/03/2021   Procedure: TRANSESOPHAGEAL ECHOCARDIOGRAM (TEE);  Surgeon: Dahlia Byes, MD;   Location: [place];  Service: Thoracic;  Laterality: N/A;   [place]   pt states testicle was ascended and had to be "pulled down"   TONSILLECTOMY     as a child    Family Hx: History reviewed. No pertinent family history.  Social History:  reports that he quit smoking about 6 years ago. His smoking use included cigarettes. He smoked an average of .5 packs per day. He has never used smokeless tobacco. He reports current alcohol use. He reports that he does not currently use drugs.  Allergies: No Known Allergies  Medications: Prior to Admission medications   Medication Sig Start Date End Date Taking? Authorizing Provider  amiodarone (PACERONE) 200 MG tablet Take 1 tablet by mouth  twice daily for 2 days then take 1 tablet daily thereafter Patient taking differently: Take 200 mg by mouth daily. 09/20/21  Yes Stehler, Pricilla Larsson, PA-C  amLODipine (NORVASC) 10 MG tablet Take 1 tablet (10 mg total) by mouth daily. 08/21/21  Yes Charlott Rakes, MD  apixaban (ELIQUIS) 5 MG TABS tablet Take 1 tablet (5 mg total) by mouth 2 (two) times daily. 09/20/21  Yes Stehler, Pricilla Larsson, PA-C  aspirin 325 MG tablet Take 325 mg by mouth daily.  Yes [provider]  atorvastatin (LIPITOR) 80 MG tablet Take 1 tablet (80 mg total) by mouth daily. 09/06/21  Yes Charlott Rakes, MD  carvedilol (COREG) 25 MG tablet Take 1 tablet (25 mg total) by mouth 2 (two) times daily with a meal. 09/20/21  Yes Stehler, Pricilla Larsson, PA-C  Dulaglutide (TRULICITY) 1.5 ZJ/6.9CV SOPN Inject 1.5 mg into the skin once a week. 06/10/21  Yes Charlott Rakes, MD  ferrous sulfate 325 (65 FE) MG tablet Take 1 tablet (325 mg total) by mouth daily with breakfast. Please take for 1 month, may stop sooner if develop constipation 09/20/21 09/20/22 Yes Stehler, Pricilla Larsson, PA-C  Insulin Glargine (BASAGLAR KWIKPEN) 100 UNIT/ML Inject 62 Units into the skin daily. Patient taking differently: Inject 71 Units into the skin daily. 09/10/21  Yes  [name], MD  oxyCODONE (OXY IR/ROXICODONE) 5 MG immediate release tablet Take 1 tablet (5 mg total) by mouth every 6 (six) hours as needed for severe pain. 09/20/21  Yes Stehler, Pricilla Larsson, PA-C  Semaglutide, 1 MG/DOSE, 4 MG/3ML SOPN Inject 1 mg as directed once a week. 07/12/21  Yes Charlott Rakes, MD  aspirin EC 81 MG tablet Take 1 tablet (81 mg total) by mouth daily. Swallow whole. Patient not taking: Reported on 09/28/2021 09/20/21   Magdalene River, PA-C  Continuous Blood Gluc Sensor (FREESTYLE LIBRE 2 SENSOR) MISC Utilize as directed q 14 days to monitor blood sugar. 05/10/21   Charlott Rakes, MD  glucose blood (TRUE METRIX BLOOD GLUCOSE TEST) test strip Use to check blood sugar three times daily. 06/10/21   Charlott Rakes, MD  Insulin Pen Needle (TRUEPLUS 5-BEVEL PEN NEEDLES) 32G X 4 MM MISC Use to inject Basaglar once daily. 05/10/21   Charlott Rakes, MD  TRUEplus Lancets 28G MISC Use to check blood sugar three times daily. 06/10/21   Charlott Rakes, MD    I have reviewed the patient's current medications.  Labs: Renal Panel: Recent Labs    09/13/21 1748 09/14/21 0547 09/29/21 0359 09/29/21 0430 09/29/21 1154 09/29/21 1244 09/29/21 1719 09/29/21 1815 09/29/21 1956 09/29/21 2152 09/30/21 0835 09/30/21 0933 09/30/21 1650 09/30/21 1936 10/01/21 0430 10/01/21 0431 10/01/21 1616 10/01/21 1623 10/02/21 0354 10/02/21 0528 10/02/21 2124 10/03/21 0408 10/03/21 0414 10/03/21 0905 10/03/21 1612 10/03/21 1615 10/03/21 1942 10/03/21 2257 10/04/21 0256 10/04/21 0623 10/04/21 1600 10/04/21 2212 10/05/21 0441 10/05/21 0451 10/05/21 1857 10/05/21 2044 10/06/21 0850 10/06/21 1513 10/06/21 1600 10/06/21 1929 10/06/21 2155 10/07/21 0408 10/07/21 0417 10/07/21 0808 10/07/21 0811 10/07/21 0917 10/07/21 0923 10/07/21 0951 10/07/21 0955 10/07/21 1106 10/07/21 1109 10/07/21 1205 10/07/21 1208 10/07/21 1311 10/07/21 1316  NA 136   < > 132*   < > 136   < > 136    < > 139   < > 137   < > 139   < > 138   < > 141   < > 140   < > 142   < > 141   < > 142   < > 144  144   < > 144   < > 144   < > 144   < > 144   < > 147*   < > 144   < > 145 148*   < > 147*   < > 146* 146* 148* 148* 147* 148* 146* 147* 144 145  K 3.7   < > 4.5   < > 4.0   < > 4.2   < > 4.3   < >  3.6   < > 4.0   < > 4.1   < > 4.3   < > 4.2   < > 3.7   < > 4.2   < > 4.0   < > 4.2  4.2   < > 4.5   < > 4.3   < > 3.6   < > 3.6   < > 3.6   < > 4.0   < > 4.0 3.8   < > 4.1   < > 3.8 3.8 3.5 3.5 3.7 3.8 3.9 3.9 4.9 4.9  CL 105   < > 101  --  100   < > 102  --  102   < > 104   < > 106  --  109  --  108  --  109   < > 110  --  111   < > 111  --  111  --  110  --  110  --  111  --  114*  --  109  --  114*  --  115* 115*  --  110  --  110  --  112*  --  111  --  110  --  112*  --   CO2 23   < > 21*  --  21*  --  23  --  26  --  23   < > 28  --  26  --  28  --  26   < > 24  --  26  --  28  --  29  --  28  --  25  --  25  --  25  --   --   --  25  --  27 25  --   --   --   --   --   --   --   --   --   --   --   --   --   GLUCOSE 135*   < > 64*  --  121*   < > 225*  --  238*   < > 139*   < > 126*  --  196*  --  183*  --  177*   < > 162*  --  149*   < > 171*  --  161*  --  142*  --  145*  --  178*  --  142*  --  146*  --  139*  --  165* 135*  --  121*  --  131*  --  125*  --  128*  --  132*  --  203*  --   BUN 17   < > 52*  --  54*   < > 51*  --  50*   < > 41*   < > 38*  --  39*  --  43*  --  47*   < > 51*  --  53*   < > 52*  --  49*  --  50*  --  52*  --  60*  --  65*  --  76*  --  72*  --  76* 78*  --  76*  --  74*  --  66*  --  70*  --  74*  --  73*  --   CREATININE 1.68*   < > 2.72*  --  3.28*   < > 3.05*  --  2.92*   < >  2.48*   < > 2.44*  --  2.47*  --  2.62*  --  2.77*   < > 2.71*  --  2.78*   < > 2.50*  --  2.44*  --  2.47*  --  2.88*  --  2.82*  --  3.01*  --  3.10*  --  3.09*  --  3.27* 3.40*  --  3.60*  --  3.50*  --  3.00*  --  3.20*  --  3.40*  --  3.40*  --   CALCIUM 7.9*   < > 8.2*  --  8.6*  --  7.7*  --   7.7*  --  8.3*   < > 7.6*  --  7.2*  --  7.4*  --  7.4*   < > 7.1*  --  7.3*  --  6.9*  --  7.4*  --  7.2*  --  7.2*  --  7.3*  --  7.3*  --   --   --  7.9*  --  8.0* 8.2*  --   --   --   --   --   --   --   --   --   --   --   --   --   MG 2.5*  --  2.4  --  2.2  --   --   --   --   --   --   --  1.7  --  2.6*  --  2.8*  --  2.7*  --  2.5*  --  2.6*  --   --   --   --   --  2.3  --   --   --   --   --   --   --   --   --   --   --   --   --   --   --   --   --   --   --   --   --   --   --   --   --   --   PHOS  --   --   --   --   --   --   --   --   --   --   --   --  4.6  --  4.5  --  4.3  --  4.2  --   --   --  3.6  --   --   --   --   --   --   --   --   --   --   --   --   --   --   --   --   --   --   --   --   --   --   --   --   --   --   --   --   --   --   --   --   ALBUMIN  --    < >  --   --   --   --  2.2*  --  2.0*  --  2.9*  --   --   --  2.3*  --   --   --  2.0*  --   --   --  2.0*  --   --   --   --   --  2.0*  --   --   --  2.0*  --   --   --   --   --  1.9*  --   --  1.9*  --   --   --   --   --   --   --   --   --   --   --   --   --    < > = values in this interval not displayed.     CBC:    Latest Ref Rng & Units 10/07/2021    1:16 PM 10/07/2021    1:11 PM 10/07/2021   12:08 PM  CBC  Hemoglobin 13.0 - 17.0 g/dL 9.2  8.2  8.2   Hematocrit 39.0 - 52.0 % 27.0  24.0  24.0      Anemia Panel:  Recent Labs    10/06/21 1600 10/06/21 1929 10/07/21 0408 10/07/21 0417 10/07/21 1109 10/07/21 1205 10/07/21 1208 10/07/21 1311 10/07/21 1316  HGB 7.7*   < > 9.1*   < > 8.2* 8.8* 8.2* 8.2* 9.2*  MCV 90.1  --  89.1  --   --   --   --   --   --    < > = values in this interval not displayed.    Recent Labs  Lab 10/01/21 0430 10/02/21 0354 10/03/21 0414 10/04/21 0256 10/05/21 0441 10/06/21 1600 10/07/21 0408  AST 45* 43* 46* 22 28 99* 97*  ALT 19 17 18 12 11 10 15   ALKPHOS 43 74 116 43 37* 73 89  BILITOT 2.3* 1.2 1.1 1.1 1.0 2.2* 1.1  PROT 3.6* 3.8* 4.1* 3.7* 3.7*  3.9* 4.1*  ALBUMIN 2.3* 2.0* 2.0* 2.0* 2.0* 1.9* 1.9*    Lab Results  Component Value Date   HGBA1C 6.5 (H) 09/10/2021    ROS: Unable to obtain review of system as patient is currently intubated and sedated.  Physical Exam: Vitals:   10/07/21 0645 10/07/21 0700  BP:    Pulse: (!) 55 (!) 54  Resp: (!) 37 (!) 36  Temp: (!) 97.2 F (36.2 C) (!) 97.2 F (36.2 C)  SpO2: 99% 99%     General exam: Critically ill looking male, on vent, sedated with multiple lines. Respiratory system: Coarse breath sound bilateral, multiple chest tube present. Cardiovascular system: S1 & S2 heard, RRR.  pedal edema ++ Gastrointestinal system: Mild distention, soft. Central nervous system: Sedated and not responding. Extremities: Bilateral pitting edema++ Skin: No rashes, lesions or ulcers Psychiatry: Unable to assess due to sedation/ encephalopathy. Dialysis access: Left groin temporary HD catheter.  Assessment/Plan:  #Acute kidney injury on CKD IIIa, nonoliguric: Ischemic ATN due to cardiogenic shock/cardiac arrest and multiple hemodynamic changes.  Now urine output is decreasing with massive fluid overload.  He is positive by more than 14 L and had ECMO removed today.  We will start CRRT today mainly to manage volume status/ultrafiltration.  I have discussed with the patient's wife and agreed with the plan.  Left IJ groin catheter was already placed by PCCM. All 4K bath and CRRT.  Monitor lab.  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  The echo was decannulated today with placement of Impella.  #Cardiogenic shock/cardiac arrest: Was on ECMO support now decannulated.  He is on multiple pressors including epinephrine, Levophed and vasopressin.  Followed closely by cardiology team.  #Acute hypoxic respiratory failure: Currently intubated and sedated.  Per pulmonary team.  #Acute blood  loss anemia: Received blood transfusion.   Monitor hemoglobin.  I have discussed with ICU team.  Also called patient's wife to discuss about current condition and starting CRRT/dialysis. We will continue to follow.  Alvilda Mckenna Tanna Furry 10/07/2021, 3:54 PM  [facility].

## 2021-10-07 NOTE — Brief Op Note (Signed)
10/07/2021  3:22 PM  PATIENT:  [name]  61 y.o. male  PRE-OPERATIVE DIAGNOSIS:  OPEN CHEST ECMO  POST-OPERATIVE DIAGNOSIS:  OPEN CHEST ECMO  PROCEDURE:  Procedure(s) with comments: INSERTION OF RIGHT AXILLARY IMPELLA, DECANNULATION OF ECMO, AND MEDIASTINAL WASHOUT (N/A) - Open right axillary with 8 mm Hemashield Platinum Vascular graft. TRANSESOPHAGEAL ECHOCARDIOGRAM (TEE)  SURGEON:  Surgeon(s) and Role:    * Wylene Weissman, Fernande Boyden, MD - Primary    * Angelena Form, Annita Brod, MD - Assisting  PHYSICIAN ASSISTANT: none  ASSISTANTS: RNFA   ANESTHESIA:   general    BLOOD ADMINISTERED:2 units PRBC's and 2 units platelets  DRAINS:  all chest tubes remained in place    LOCAL MEDICATIONS USED:  NONE  SPECIMEN:  No Specimen  DISPOSITION OF SPECIMEN:  N/A  COUNTS:  YES  TOURNIQUET:  * No tourniquets in log *  DICTATION: .Note written in EPIC  PLAN OF CARE: Admit to inpatient   PATIENT DISPOSITION:  ICU - intubated and critically ill.   Delay start of Pharmacological VTE agent (>24hrs) due to surgical blood loss or risk of bleeding: yes

## 2021-10-07 NOTE — Anesthesia Postprocedure Evaluation (Signed)
Anesthesia Post Note  Patient: [name]  Procedure(s) Performed: INSERTION OF RIGHT AXILLARY IMPELLA, DECANNULATION OF ECMO, AND MEDIASTINAL WASHOUT (Chest) TRANSESOPHAGEAL ECHOCARDIOGRAM (TEE)     Patient location during evaluation: SICU Anesthesia Type: General Level of consciousness: sedated Pain management: pain level controlled Vital Signs Assessment: post-procedure vital signs reviewed and stable Respiratory status: patient remains intubated per anesthesia plan Cardiovascular status: stable Postop Assessment: no apparent nausea or vomiting Anesthetic complications: no   No notable events documented.  Last Vitals:  Vitals:   10/07/21 1645 10/07/21 1700  BP:    Pulse: (!) 101 (!) 101  Resp: (!) 23 (!) 26  Temp: (!) 36 C (!) 36.1 C  SpO2: 96% 98%    Last Pain:  Vitals:   10/07/21 1600  TempSrc: Bladder  PainSc:                  [name]

## 2021-10-07 NOTE — Progress Notes (Signed)
eLink Physician-Brief Progress Note Patient Name: [name] DOB: 10-09-1960 MRN: [id number]   Date of Service  10/07/2021  HPI/Events of Note  Received query regarding amiodarone gtt.  Pt remains in atrial fibrillation with rate in the 100s-110s. Amiodarone gtt due to be decreased to 0.5mg /min now.   eICU Interventions  Keep amiodarone gtt at 60mg /hr for now.      Intervention Category Intermediate Interventions: Arrhythmia - evaluation and management  Elsie Lincoln 10/07/2021, 11:03 PM

## 2021-10-07 NOTE — Procedures (Signed)
Central Venous Catheter Insertion Procedure Note  [name]  [id number]  04-14-1960  Date:10/07/21  Time:3:50 PM   Provider Performing:Donyae Kilner Shearon Stalls   Procedure: Insertion of Non-tunneled Central Venous Catheter(36556)with US guidance (07371)    Indication(s) Difficult access and Hemodialysis  Consent Risks of the procedure as well as the alternatives and risks of each were explained to the patient and/or caregiver.  Consent for the procedure was obtained and is signed in the bedside chart  Anesthesia Topical only with 1% lidocaine   Timeout Verified patient identification, verified procedure, site/side was marked, verified correct patient position, special equipment/implants available, medications/allergies/relevant history reviewed, required imaging and test results available.  Sterile Technique Maximal sterile technique including full sterile barrier drape, hand hygiene, sterile gown, sterile gloves, mask, hair covering, sterile ultrasound probe cover (if used).  Procedure Description Area of catheter insertion was cleaned with chlorhexidine and draped in sterile fashion.   With real-time ultrasound guidance a HD catheter was placed into the left femoral vein.  Nonpulsatile blood flow and easy flushing noted in all ports.  The catheter was sutured in place and sterile dressing applied.  Complications/Tolerance None; patient tolerated the procedure well. Chest X-ray is ordered to verify placement for internal jugular or subclavian cannulation.  Chest x-ray is not ordered for femoral cannulation.  EBL Minimal  Specimen(s) None   Montey Hora, PA - [facility] For pager details, please see AMION or use Epic chat  After 1900, please call [place] for cross coverage needs 10/07/2021, 3:51 PM

## 2021-10-07 NOTE — Anesthesia Preprocedure Evaluation (Addendum)
Anesthesia Evaluation  Patient identified by MRN, date of birth, ID band Patient unresponsive    Reviewed: Allergy & Precautions, NPO status , Patient's Chart, lab work & pertinent test results, Unable to perform ROS - Chart review only  Airway Mallampati: Intubated       Dental  (+) Teeth Intact   Pulmonary Patient abstained from smoking., former smoker,    breath sounds clear to auscultation       Cardiovascular hypertension, Pt. on medications and Pt. on home beta blockers + CAD, + Past MI and + CABG   Rhythm:Regular Rate:Normal  Echo: 1. Left ventricular ejection fraction, by estimation, is 25%. The left  ventricle has severely decreased function. The left ventricle demonstrates  global hypokinesis. There is moderate concentric left ventricular  hypertrophy.  2. Right ventricular systolic function is moderately reduced. The right  ventricular size is normal.  3. No left atrial/left atrial appendage thrombus was detected.  4. Moderate effusion with packing material lateral to the heart. Moderate  pericardial effusion.  5. The mitral valve is normal in structure. Trivial mitral valve  regurgitation.  6. The aortic valve is tricuspid. There is mild calcification of the  aortic valve. Aortic valve regurgitation is mild.  7. There is Moderate (Grade III) plaque involving the descending aorta.  8. ECMO cannula visualized spanning IVC to RA to SVC.    Neuro/Psych CVA negative psych ROS   GI/Hepatic Neg liver ROS, GERD  ,  Endo/Other  diabetes, Type 2, Insulin Dependent, Oral Hypoglycemic Agents  Renal/GU Renal disease     Musculoskeletal   Abdominal Normal abdominal exam  (+)   Peds  Hematology   Anesthesia Other Findings   Reproductive/Obstetrics                          Anesthesia Physical Anesthesia Plan  ASA: 4  Anesthesia Plan: General   Post-op Pain Management:     Induction: Inhalational  PONV Risk Score and Plan: 3 and Ondansetron, Dexamethasone and Midazolam  Airway Management Planned: Oral ETT  Additional Equipment:   Intra-op Plan:   Post-operative Plan: Post-operative intubation/ventilation  Informed Consent:     History available from chart only  Plan Discussed with: CRNA  Anesthesia Plan Comments: (Arterial Line and CVC in place)      Anesthesia Quick Evaluation

## 2021-10-07 NOTE — Progress Notes (Signed)
NAME:  [name], MRN:  [id number], DOB:  11/16/1960, LOS: 9 ADMISSION DATE:  09/28/2021, CONSULTATION DATE:  09/29/21 REFERRING MD:  Dr. Kipp Brood, CHIEF COMPLAINT:  Cardiac Arrest   History of Present Illness:  [name] is a 61 year old male with DMII, , CVA, atrial fibrillation on eliquis and multivessel CAD s/p 5 vessel CABG on 09/12/21 who presented 8/19 with shortness of breath via EMS. He was found to be bradycardic and hypoglycemic. He symptomatically improved with dextrose but continued to have bradycardia and soft blood pressures. ECHO obtained which showed LVEF 50%, grade II diastolic dysfunction and moderate pericardial effusion with possible tamponade physiology. CT Chest showed pericardial effusion and small bilateral pleural effusions. He was taken to the OR 8/19 for pericardial window.   Patient started having issues with tachy-brady arrhythmias on telemetry, decreased urine output and increased work of breathing over night. A 14 french pigtail catheter was placed in the left pleural space this morning with bloody drainage. At 10:35am patient became unresponsive with PEA arrest. He underwent multiple rounds of CPR with epinephrine pushes. ROSC was obtained and patient was intubated. Bedside US by cardiology showed large pericardial effusion with tamponade physiology. He lost pulse again and CPR was again performed. Please see code sheet for full details. Bedside sternotomy performed with pericardial drainage by CT surgery, immediate improvement in hemodynamics achieved with drainage of the pericardial space.   Pertinent  Medical History   Past Medical History:  Diagnosis Date   AKI (acute kidney injury) ([place])    pt unaware of this   Anginal pain ([place])    CAD (coronary artery disease)    a. 03/2015 NSTEMI: LHC with severe 3V CAD  (70% mid RCA, 95% OM1, 90% distal LCx, 90% OM3, 80% prox LAD and 90% ost D1) s/p DES to mLAD w/ small dissction Rx with DES, staged ost Ramus PCI/DES and  dLCx s/p PCI/DES    Chest pain 12/24/2020   Diabetes mellitus type 2 in obese [facility])    Diverticulosis    Dyspnea    Dyspnea on exertion 03/16/2015   Dyspnea on exertion   Family history of adverse reaction to anesthesia    patient father- pt states after anesthesia his father "developed dementia"   GERD (gastroesophageal reflux disease)    Hypercholesteremia    Hypertension associated with diabetes ([place]) 03/16/2015   hypertension   NSTEMI (non-ST elevated myocardial infarction) ([place]) 03/17/2015   Obesity    Stroke ([place]) 2022   pt states he had a "mini stroke" during cardiac catheterization   Tobacco abuse    Significant Hospital Events: Including procedures, antibiotic start and stop dates in addition to other pertinent events   8/19 admitted, s/p pericardial window 8/20 PEA cardiac arrest, left pigtail chest tube placement, PEA cardiac arrest due to tamponade, bedside sternotomy with pericardial drains placed. 8/21 return to OR for overnight bleeding.  8/22 minimal chest tube output.  Atrial fibrillation, controled with amiodarone.  8/22 TEE showed EF 35% at baseline, which improved significantly with decreasing ECMO flow.  8/23 tolerated diuresis  8/24 mediastinal washout, removal of hematoma 8/25 ongoing bleeding issues from posterior mediastinum  Interim History / Subjective:  Decannulated in the OR this morning. Acute RV failure after decannulation from ECMO.   Objective   Blood pressure 126/70, pulse (!) 55, temperature (!) 97.2 F (36.2 C), resp. rate (!) 37, height 5\' 7"  (1.702 m), weight 108.4 kg, SpO2 99 %. CVP:  [10 mmHg-16 mmHg] 13 mmHg  Vent Mode: PRVC FiO2 (%):  [21 %] 21 % Set Rate:  [20 bmp] 20 bmp Vt Set:  [390 mL] 390 mL PEEP:  [5 cmH20] 5 cmH20 Plateau Pressure:  [19 cmH20-21 cmH20] 20 cmH20   Intake/Output Summary (Last 24 hours) at 10/07/2021 0713 Last data filed at 10/07/2021 0600 Gross per 24 hour  Intake 3791.43 ml  Output 3490 ml  Net 301.43 ml     Filed Weights   10/03/21 0500 10/04/21 0500 10/05/21 0630  Weight: 104.3 kg 105.4 kg 108.4 kg   Examination: Constitutional: critically ill appearing man intubated, sedated HEENT: [place]/AT, eyes anicteric. Anisicoria unchanged. Cardiovascular: sternal dressing pulsing, no significant bleeding around dressing. Mediastinal tubes with clotting blood. Respiratory: CTAB, synchronous with MV. Pplat 20, DP 15 Gastrointestinal:  soft, NT Skin: warm, dry Neurologic: RASS -5, reactive pupils GU: foley draining yellow urine  Ancillary tests personally review:  7.4/48/301/29 Na +148 BUN 78 Cr 3.4 AST 97 ALT 15 WBC 10.2 H/H 9.1/27.8 Platelets 71 fibrinogen 526 LDH 1299  CXR personally reviewed> bilateral pulmonary edema,  left pleural effusion. ECMO cannulas, left sided chest tube.   Assessment & Plan:  Hemorraghic pericarditis w/ cardiac tamponade/obstructive shock causing PEA arrest s/p pericardial drain placement with subsequent cardiac stunning on VA ECMO S/P coronary artery disease s/p CABG x 5 8/3 Paroxysmal atrial fibrillation Acute RV failure with cardiogenic & distributive shock - weaned off VA ECMO, impella 5.5 placed today in OR with pericardial washout and ECMO decannulation -epinephrine, NE, milrinone for acute RV failure -AC per TCTS> held for now -impella per Cardiology; on P4 -needs volume off; will need CRRT  Acute hypoxemic and hypercapnic respiratory failure - LTVV, 4-8cc/kg IBW with goal Pplat<30 and DP<15. Increased PEEP to 8. -VAP prevention protocol -PAD protocol for sedation -SAT & SBT when appropriate; not stable enough yet  Acute metabolic encephalopathy due to sedation; has previously been able to follow commands but was agitated -PAD protocol for sedation; wean as able. Wean versed first.   Sepsis due to pericarditis -antibiotics- vanc & meropenem  Acute Kidney 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 Hypervolemia Acute metabolic acidosis -consult nephrology for  CRRT; not likely to tolerate volume removal yet  -bicarb gtt  Acute blood loss anemia- ongoing issue with any AC challenge - balanced transfusions PRN -TEG pending -con't to monitor - Daily discussion risk/benefits for AC intensity  Hypernatremia, improved -monitor  Hx of DMII -SSI PRN -goal BG 140-180  Intolerance to TF -improved with postpyloric tube; can resume TF tomorrow when vasopressors and inotrope requirements improve.  Best Practice (right click and "Reselect all SmartList Selections" daily)   Diet/type: TF- held for OR DVT prophylaxis: see discussion above GI prophylaxis: PPI Lines: Central line and yes and it is still needed Foley:  Yes, and it is still needed Code Status:  full code Last date of multidisciplinary goals of care discussion [Per primary]  This patient is critically ill with multiple organ system failure which requires frequent high complexity decision making, assessment, support, evaluation, and titration of therapies. This was completed through the application of advanced monitoring technologies and extensive interpretation of multiple databases. During this encounter critical care time was devoted to patient care services described in this note for 60 minutes.  [name], DO 10/07/21 7:22 AM [facility]

## 2021-10-07 NOTE — Op Note (Signed)
CARDIOVASCULAR SURGERY OPERATIVE NOTE  08/06/2020  Surgeon:  [name], MD  First Assistant: Lauree Chandler, MD:  An experienced assistant was required given the complexity of this surgery and the standard of surgical care. The assistant was needed for exposure, dissection, suctioning, retraction of delicate tissues and sutures, instrument exchange and for overall help during this procedure.    Preoperative Diagnosis:  Cardiogenic shock on VA ECMO   Postoperative Diagnosis:  Same   Procedure:  Insertion of Impella 5.5 via right axillary artery. Weaning from [place] ECMO and decannulation Mediastinal washout.  Anesthesia:  General Endotracheal   Clinical History/Surgical Indication: The patient is a 61 year old gentleman who underwent coronary artery bypass graft surgery x5 on 09/12/2021.  His postoperative course was complicated by atrial fibrillation that was converted with amiodarone.  He was discharged home on Eliquis.  He also has stage IIIb chronic kidney disease with a creatinine of around 1.8 which increased slightly postoperatively and prolonged how long it took to diurese him back to his baseline weight.  He continued to improve and was discharged home on 09/20/2021.  He reported that he initially felt well when he went home but then began having lethargy and dyspnea on exertion and he presented to the emergency room on 09/28/2021 after he woke up with acute shortness of breath and diaphoresis.  A CT scan of the chest showed cardiomegaly with small pericardial effusion.  There were small bilateral pleural effusions with bibasilar atelectasis.  A 2D echocardiogram on 09/28/2021 showed a left ventricular ejection fraction of 50% with grade 2 diastolic dysfunction.  There was felt to be a moderate size pericardial effusion that was circumferential.  There was a question of possible tamponade.  The patient was taken to the operating room by Dr. Kipp Brood on 09/28/2021 and underwent  right VATS for pericardial window.  It was noted that there was significant clot upon entry into the pericardium as well as some serosanguineous fluid.  He was taken back to the intensive care unit but remained hemodynamically unstable and became unresponsive and lost his blood pressure requiring CPR.  After he regained his pulse a bedside echo showed a large posterior clot and he underwent mediastinal exploration by Dr. Kipp Brood.  The clot was evacuated with improvement in his hemodynamics.  The sternum was not closed.  He continued to have arrhythmias and cardiogenic shock and was taken back to the operating room for mediastinal reexploration and institution of VA ECMO by Dr. Kipp Brood.  This was performed using central aortic cannulation and a 23/25 French multistage cannula in the right femoral vein.  He has since required two mediastinal explorations for bleeding and washout requiring large amounts of clotting factors. No specific bleeding sites were found.  He has now been on ECMO for 8 days and has been evaluated by a multidisciplinary team and felt to be ready for ECMO weaning and decannulation.  We felt that it would be best to insert an Impella 5.5 by the right axillary artery to provide hemodynamic support after discontinuation of ECMO.  The operative procedure was discussed with the patient's wife including alternatives, benefits, and risks including but not limited to bleeding, blood transfusion, infection, stroke, myocardial infarction, organ dysfunction, and death.  She understood and agreed to proceed.  Preparation:  The patient was taken directed from the ICU to the OR. The consent was signed by me. Preoperative antibiotics were given.  After being placed under general endotracheal anesthesia by the anesthesia team pt was prepped with  betadine soap and solution from the neck to the knees and draped in the usual sterile manner. A surgical time-out was taken and the correct patient and operative  procedure were confirmed with the nursing and anesthesia staff.   Right axillary artery exposure and cannulation:  A transverse incision was made below the right clavicle. The pectoralis major muscle was split along its fibers and the pectoralis minor muscle was retracted laterally. The brachial plexus was identified and gently retracted to expose the axillary artery. The artery was controlled proximally and distally with vessel loops. The patient was heparinized and ACT maintained greater than 250. The axillary artery was clamped proximally and distally with peripheral Debakey clamps. It was opened longitudinally.  An 8 mm Hemashield dacron graft was anastomosed in an end to side manner using continuous 5-0 prolene suture. CoSeal was applied for hemostasis and the clamp removed.   Insertion of Impella 5.5:  The peel away sheath was inserted into the end of the graft and fastened with the sleeve connectors to provide hemostasis. A .035 J-wire was advanced through the sheath into the ascending aorta under fluoroscopic guidance usin C-arm. This took a very long time due to the angle of takeoff of the innominate artery from the aortic arch. We tried multiple different wires and catheters and finally by luck were able to pass a wire into the ascending aorta. It appeared to me that the origin of the innominate artery was more distal than usual off the aortic arch and the wire had to make a sharp turn to pass proximal into the ascending aorta. It just wanted to pass distally into the descending aorta. A 6 Fr JR4 catheter was inserted over the wire and then advanced across the aortic valve without difficulty to the LV apex. The J-wire was changed out for 0.18 wire and it was positioned with a gentle curve in the LV apex. The JR4 catheter was removed. Then the graft was clamped adjacent to the anastomosis and the 5.5 Impella inserted through the sheath. Before full insertion it was burped by removing the clamp on  the graft temporarily. The Impella was advanced fully into the graft and the clamp removed. The Impella was advanced into the LV and the wire removed. Position was confirmed with TEE with the tip 5 cm below the aortic valve.  The Impella was turned on at P2.  The graft was again clamped and the graft was cut flush with the skin. The sheath was inserted into the end of the graft and it was fastened with 1-0 silk ties for hemostasis. The sheath was attached to the skin with 0-silk sutures.  The wound was hemostatic. The pectoralis muscle was approximated with continuous 2-0 Vicryl suture. The subcutaneous tissue was approximated with continuous 3-0 Vicryl suture. The skin was approximated with staples  Mediastinal washout, weaning ECMO and decannulation:  The chest retractor was placed.  There was some clot present in the anterior mediastinum and this was removed.  The previously placed chest tubes were removed from their original positions and declotted.  I did not disturb the lateral, posterior, or inferior surfaces of the heart to prevent excessive mediastinal bleeding which happened with previous explorations.  Then the Cardiohelp pump was weaned gradually and the Impella support was gradually increased.  When we were on minimal ECMO support TEE was performed again and felt to show adequate left ventricular decompression and good left ventricular systolic function. Right ventricular function appeared decreased but adequate. ECMO was discontinued.  The aortic cannula was removed.  The right femoral venous cannula was removed with direct pressure held at the site for 20 minutes.  He continued to show signs of right ventricular systolic dysfunction and was started on epinephrine, milrinone, and norepinephrine drips.  He required a couple small boluses of epinephrine as well as calcium and bicarbonate to maintain hemodynamic stability.  I did not feel that we would be able to close his sternum at this time due  to his right ventricular function and massive volume overload.  The mediastinum was copiously irrigated with saline.  The chest tubes were returned to their previous positions.  An Esmarch rubber sheet was cut to the appropriate length and doubled.  It was sewn around the skin edges using continuous 3-0 Prolene suture.  The right axillary wound was examined and the anastomosis of the graft to the axillary artery was hemostatic.  There was some oozing from the soft tissue due to coagulopathy.  I placed a 15 [place] round JP drain through a separate stab incision into the depth of the wound below the pectoralis muscle.  The pectoralis major muscle was reapproximated with continuous #1 Vicryl suture.  Subcutaneous tissue was closed with continuous 3-0 Vicryl suture.  The skin was closed with staples.   All sponge, needle, and instrument counts were reported correct at the end of the case. Dry sterile dressings were placed over the incision. The Impella was anchored to the chest wall. The patient was then transported to the surgical intensive care unit in critical but stable condition.

## 2021-10-07 NOTE — Progress Notes (Signed)
Patient ID: [name], male   DOB: [birth year], 61 y.o.   MRN: [id number]    Advanced Heart Failure Rounding Note   Subjective:    - 8/19 Pericardial window - 8/20 Cardiac arrest with tamponade -> Emergent bedside washout - 09/29/21 VA Cannulation - 09/30/21 Return to OR for mediastinal hemorrhage - 10/01/21 Developed AF -> amio - 10/02/21 TEE EF 25-30%  - 10/04/21 Back to OR for washout. C/b continued bleeding overnight - 10/07/21 Back to OR, placement of Impella 5.5 with washout. VA ECMO decannulation.   Patient was brought to OR by Dr. Cyndia Bent for ECMO decannulation, Impella 5.5 placement, and washout.  I assisted in the OR with TEE imaging and management of drips, ECMO weaning.  Impella 5.5 placement was difficult due to trouble navigating the graft anastomosis but was ultimately successful. Initially, patient became hypotensive with development of severe RV dysfunction after ECMO turned off and pressors titrated up, HCO3 given with ultimate recovery of pressure.   Patient continues to ooze from his chest tubes now that he is back on the unit.  He went into atrial fibrillation with RVR with fall in MAP, improved back in NSR on amiodarone increased to 60 mg/hr.  Echo shows LV EF 55-60% with severe LVH, small ventricular cavity, RV appears to have near normal systolic function at this point.  Impella position appears adequate at about 4.5 cm currently.  He is on epinephrine 7, milrinone 0.375, NE 20, vasopressin 0.04 currently with SBP 120s and titrating down pressors. He has had multiple units of blood products.   CVVH initiated, running I/Os even and just cleaning blood.   Remains intubated/sedated. Meropenem and vancomycin continue.   Impella 5.5 P4 Flow 2.4 L/min  Objective:   Weight Range:  Vital Signs:   Temp:  [96.4 F (35.8 C)-97.2 F (36.2 C)] 96.8 F (36 C) (08/28 1800) Pulse Rate:  [52-139] 96 (08/28 1800) Resp:  [8-37] 20 (08/28 1800) BP: (126)/(70) 126/70 (08/27  1935) SpO2:  [93 %-100 %] 100 % (08/28 1800) Arterial Line BP: (89-142)/(48-75) 118/52 (08/28 1800) FiO2 (%):  [21 %-50 %] 50 % (08/28 1540) Last BM Date : 10/07/21  Weight change: Filed Weights   10/03/21 0500 10/04/21 0500 10/05/21 0630  Weight: 104.3 kg 105.4 kg 108.4 kg    Intake/Output:   Intake/Output Summary (Last 24 hours) at 10/07/2021 1822 Last data filed at 10/07/2021 1700 Gross per 24 hour  Intake 6419.18 ml  Output 3520 ml  Net 2899.18 ml     Physical Exam: General: Sedated on vent Neck: JVP difficult, no thyromegaly or thyroid nodule.  Lungs: Decreased at bases.  CV: Chest open Abdomen: Soft, nontender, no hepatosplenomegaly, no distention.  Skin: Intact without lesions or rashes.  Neurologic: Sedated Extremities: No clubbing or cyanosis.  HEENT: Normal.   Telemetry: Atrial fibrillation 140s => NSR 90s Personally reviewed   Labs: Basic Metabolic Panel: Recent Labs  Lab 10/01/21 0430 10/01/21 0431 10/01/21 1616 10/01/21 1623 10/02/21 0354 10/02/21 0528 10/02/21 2124 10/03/21 0408 10/03/21 0414 10/03/21 0905 10/04/21 0256 10/04/21 2376 10/05/21 0441 10/05/21 0451 10/05/21 1857 10/05/21 2044 10/06/21 1600 10/06/21 1929 10/06/21 2155 10/07/21 0408 10/07/21 0417 10/07/21 0951 10/07/21 0955 10/07/21 1106 10/07/21 1109 10/07/21 1205 10/07/21 1208 10/07/21 1311 10/07/21 1316 10/07/21 1532 10/07/21 1738 10/07/21 1743  NA 138   < > 141   < > 140   < > 142   < > 141   < > 144   < > 144   < >  144   < > 144   < > 145 148*   < > 148*   < > 147*   < > 146*   < > 144 145 146* 145 145  K 4.1   < > 4.3   < > 4.2   < > 3.7   < > 4.2   < > 4.5   < > 3.6   < > 3.6   < > 4.0   < > 4.0 3.8   < > 3.5   < > 3.7   < > 3.9   < > 4.9 4.9 4.2 4.6 4.6  CL 109  --  108  --  109   < > 110  --  111   < > 110   < > 111  --  114*   < > 114*  --  115* 115*   < > 112*  --  111  --  110  --  112*  --   --   --  109  CO2 26  --  28  --  26   < > 24  --  26   < > 28   <  > 25  --  25  --  25  --  27 25  --   --   --   --   --   --   --   --   --   --   --   --   GLUCOSE 196*  --  183*  --  177*   < > 162*  --  149*   < > 142*   < > 178*  --  142*   < > 139*  --  165* 135*   < > 125*  --  128*  --  132*  --  203*  --   --   --  274*  BUN 39*  --  43*  --  47*   < > 51*  --  53*   < > 50*   < > 60*  --  65*   < > 72*  --  76* 78*   < > 66*  --  70*  --  74*  --  73*  --   --   --  70*  CREATININE 2.47*  --  2.62*  --  2.77*   < > 2.71*  --  2.78*   < > 2.47*   < > 2.82*  --  3.01*   < > 3.09*  --  3.27* 3.40*   < > 3.00*  --  3.20*  --  3.40*  --  3.40*  --   --   --  3.60*  CALCIUM 7.2*  --  7.4*  --  7.4*   < > 7.1*  --  7.3*   < > 7.2*   < > 7.3*  --  7.3*  --  7.9*  --  8.0* 8.2*  --   --   --   --   --   --   --   --   --   --   --   --   MG 2.6*  --  2.8*  --  2.7*  --  2.5*  --  2.6*  --  2.3  --   --   --   --   --   --   --   --   --   --   --   --   --   --   --   --   --   --   --   --   --  PHOS 4.5  --  4.3  --  4.2  --   --   --  3.6  --   --   --   --   --   --   --   --   --   --   --   --   --   --   --   --   --   --   --   --   --   --   --    < > = values in this interval not displayed.    Liver Function Tests: Recent Labs  Lab 10/03/21 0414 10/04/21 0256 10/05/21 0441 10/06/21 1600 10/07/21 0408  AST 46* 22 28 99* 97*  ALT 18 12 11 10 15   ALKPHOS 116 43 37* 73 89  BILITOT 1.1 1.1 1.0 2.2* 1.1  PROT 4.1* 3.7* 3.7* 3.9* 4.1*  ALBUMIN 2.0* 2.0* 2.0* 1.9* 1.9*   No results for input(s): "LIPASE", "AMYLASE" in the last 168 hours. No results for input(s): "AMMONIA" in the last 168 hours.  CBC: Recent Labs  Lab 10/05/21 1700 10/05/21 2044 10/06/21 0401 10/06/21 0409 10/06/21 0609 10/06/21 0746 10/06/21 1600 10/06/21 1929 10/07/21 0408 10/07/21 0417 10/07/21 1316 10/07/21 1530 10/07/21 1532 10/07/21 1738 10/07/21 1743  WBC 9.3  --  9.7  --   --   --  9.8  --  10.2  --   --  24.0*  --   --   --   HGB 8.7*   < > 8.4*   < >  --     < > 7.7*   < > 9.1*   < > 9.2* 10.6* 9.9* 8.2* 8.5*  HCT 24.8*   < > 24.6*   < >  --    < > 22.7*   < > 27.8*   < > 27.0* 31.7* 29.0* 24.0* 25.0*  MCV 86.1  --  87.5  --   --   --  90.1  --  89.1  --   --  88.1  --   --   --   PLT 72*  74*  --  74*  --  69*  --  72*  --  71*  --   --  135*  --   --   --    < > = values in this interval not displayed.    Cardiac Enzymes: No results for input(s): "CKTOTAL", "CKMB", "CKMBINDEX", "TROPONINI" in the last 168 hours.  BNP: BNP (last 3 results) Recent Labs    12/24/20 0651 09/28/21 0405  BNP 412.0* 826.6*    ProBNP (last 3 results) No results for input(s): "PROBNP" in the last 8760 hours.    Other results:  Imaging: DG Chest Port 1 View  Result Date: 10/07/2021 CLINICAL DATA:  Heart failure. EXAM: PORTABLE CHEST 1 VIEW COMPARISON:  Same day. FINDINGS: Stable cardiomegaly. Status post coronary bypass graft. Right internal jugular catheter is noted with tip in expected position of the SVC. Feeding tube is seen entering stomach. Tracheostomy tube is in good position. Left-sided chest tube is noted without pneumothorax. Bibasilar atelectasis and effusions are noted. Bony thorax is unremarkable. There is been interval placement of Impella device from right subclavian approach. IMPRESSION: Interval placement of Impella device from right subclavian approach. Left-sided chest tube is noted without pneumothorax. Stable bibasilar atelectasis and effusions are noted. Electronically Signed   By: Marijo Conception M.D.   On: 10/07/2021 15:57  DG C-Arm 1-60 Min-No Report  Result Date: 10/07/2021 Fluoroscopy was utilized by the requesting physician.  No radiographic interpretation.   DG C-Arm 1-60 Min-No Report  Result Date: 10/07/2021 Fluoroscopy was utilized by the requesting physician.  No radiographic interpretation.   DG C-Arm 1-60 Min-No Report  Result Date: 10/07/2021 Fluoroscopy was utilized by the requesting physician.  No radiographic  interpretation.   DG Chest Port 1 View  Result Date: 10/07/2021 CLINICAL DATA:  ECMO, ETT present EXAM: PORTABLE CHEST 1 VIEW COMPARISON:  10/06/2021 FINDINGS: No significant change in AP portable chest radiograph. Cardiomegaly. Support apparatus including endotracheal tube, left and right neck vascular catheters, left-sided chest and mediastinal drainage tubes, and arteriovenous ECMO cannula are unchanged. Probable small layering pleural effusions. IMPRESSION: 1. No significant change in AP portable chest radiograph. 2. Unchanged support apparatus as above. 3. Probable small layering pleural effusions. No new airspace opacity. Electronically Signed   By: Delanna Ahmadi M.D.   On: 10/07/2021 08:18   DG Chest Port 1 View  Result Date: 10/06/2021 CLINICAL DATA:  Personal history of ECMO EXAM: PORTABLE CHEST 1 VIEW COMPARISON:  October 05, 2021 FINDINGS: The ET tube, left central line, right central line, ECMO catheter, mediastinal drain, and left chest tubes are all stable. No pneumothorax. Atelectasis in the left base. The left lung is otherwise clear. Haziness over the right chest is at least partially explained by layering effusion. No change in the cardiomediastinal silhouette. No other interval changes. IMPRESSION: 1. Support apparatus as above. 2. Probable small layering effusion on the right. 3. Atelectasis in the left base.  Probable tiny left effusion. 4. No pneumothorax. Electronically Signed   By: Dorise Bullion III M.D.   On: 10/06/2021 08:04     Medications:     Scheduled Medications:  sodium chloride   Intravenous Once   [START ON 10/08/2021] acetaminophen  1,000 mg Oral Q6H   Or   [START ON 10/08/2021] acetaminophen (TYLENOL) oral liquid 160 mg/5 mL  1,000 mg Per Tube Q6H   acetaminophen (TYLENOL) oral liquid 160 mg/5 mL  650 mg Per Tube Once   Or   acetaminophen  650 mg Rectal Once   atorvastatin  80 mg Per Tube Daily   chlorhexidine  15 mL Mouth/Throat NOW   Chlorhexidine  Gluconate Cloth  6 each Topical Daily   clonazePAM  1 mg Per Tube Q8H   colchicine  0.6 mg Per Tube Daily   insulin aspart  2-6 Units Subcutaneous Q4H   metoCLOPramide (REGLAN) injection  10 mg Intravenous Q6H   mouth rinse  15 mL Mouth Rinse Q2H   pantoprazole (PROTONIX) IV  40 mg Intravenous QHS   polyethylene glycol  17 g Per Tube Daily   QUEtiapine  50 mg Per Tube BID   [START ON 10/08/2021] sodium chloride flush  3 mL Intravenous Q12H   vancomycin variable dose per unstable renal function (pharmacist dosing)   Does not apply See admin instructions    Infusions:   prismasol BGK 4/2.5 400 mL/hr at 10/07/21 1702    prismasol BGK 4/2.5 400 mL/hr at 10/07/21 1702   albumin human     albumin human 12.5 g (10/07/21 1801)   amiodarone     amiodarone 60 mg/hr (10/07/21 1802)   epinephrine 7 mcg/min (10/07/21 1600)   HYDROmorphone 4 mg/hr (10/07/21 1600)   lactated ringers     lactated ringers 20 mL/hr at 10/07/21 1600   magnesium sulfate bolus IVPB 2 g (10/07/21 1732)  meropenem (MERREM) IV 1 g (10/07/21 1810)   midazolam 10 mg/hr (10/07/21 1600)   milrinone 0.375 mcg/kg/min (10/07/21 1805)   nitroGLYCERIN Stopped (10/07/21 1556)   norepinephrine (LEVOPHED) Adult infusion 20 mcg/min (10/07/21 1600)   potassium chloride     prismasol BGK 4/2.5 1,500 mL/hr at 10/07/21 1702   sodium bicarbonate 25 mEq (Impella PURGE) in dextrose 5 % 1000 mL bag     sodium bicarbonate 75 mEq in sodium chloride 0.45 % 1,075 mL infusion 125 mL/hr at 10/07/21 1600   tranexamic acid     vasopressin 0.04 Units/min (10/07/21 1600)    PRN Medications: albumin human, albumin human, artificial tears, dextrose, heparin, HYDROmorphone, midazolam, midazolam, ondansetron (ZOFRAN) IV, ondansetron (ZOFRAN) IV, mouth rinse, mouth rinse, sodium chloride, [START ON 10/08/2021] sodium chloride flush   Assessment/Plan:    1. Shock - mixed cardiogenic/hemorrhagic -> on VA ECMO -> decannulated on 8/28 to Impella 5.5 -  Echo currently with underfilled LV, EF 55-60% with severe LVH and near normal RV systolic function.  - Pressor support as above, starting to wean now that he is back in NSR.   - Impella remains at P4, suction if increased (underfilled/small/vigorous LV).  Position looks adequate by echo.  No anticoagulation yet with ongoing oozing.  - Needs volume, getting blood products/albumin with ongoing oozing.  2. Cardiac arrest (PEA/bradycardic) - 8/19 in setting of tamponade - rhythm stable.  3. Cardiac tamponade with emergent bedside sternotomy  - Diffuse epicardial bleeding with post-op Dresslers - return to OR 8/21 and 8/24 for washouts.  - Washout 8/28 in OR.  - Still oozing from chest tubes, replace blood products and no heparin for now. .  5. Acute hypoxemic respiratory failure - Off ECMO.  - Vent support per CCM 6. AKI due to ATN - CVVH started today.  Will clean blood only for now as he needs volume. 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 DM2 - continue SSI 10. PAF - Tolerates poorly.  - Back in NSR on amiodarone 60 mg daily.  11. ID - On vancomycin/meropenem with open chest.  12. Neuro - Has followed commands with vent wean.  - Requiring high dose sedation.  13. FEN - TFs ongoing  Care provided in OR and back on 2H ICU.  Coordination with TCTS, CCM, and nephrology.   CRITICAL CARE Performed by: Loralie Champagne  Total critical care time: 120 minutes  Critical care time was exclusive of separately billable procedures and treating other patients.  Critical care was necessary to treat or prevent imminent or life-threatening deterioration.  Critical care was time spent personally by me (independent of midlevel providers or residents) on the following activities: development of treatment plan with patient and/or surrogate as well as nursing, discussions with consultants, evaluation  of patient's response to treatment, examination of patient, obtaining history from patient or surrogate, ordering and performing treatments and interventions, ordering and review of laboratory studies, ordering and review of radiographic studies, pulse oximetry and re-evaluation of patient's condition.  Length of Stay: Fernando Salinas 10/07/2021, 6:22 PM  Advanced Heart Failure Team Pager [phone] (M-F; 7a - 4p)  Please contact [facility] for night-coverage after hours (4p -7a ) and weekends on [email or web address]

## 2021-10-07 NOTE — Progress Notes (Signed)
  Echocardiogram Echocardiogram Transesophageal has been performed.  [name] 10/07/2021, 8:23 AM

## 2021-10-07 NOTE — Progress Notes (Signed)
  Echocardiogram 2D Echocardiogram has been performed.  Bobbye Charleston 10/07/2021, 6:35 PM

## 2021-10-07 NOTE — Progress Notes (Signed)
eLink Physician-Brief Progress Note Patient Name: [name] DOB: 12-Apr-1960 MRN: [id number]   Date of Service  10/07/2021  HPI/Events of Note  Notified of hyperglycemia with glucose at 274.  Pt has no tube feeds running.    eICU Interventions  Increase insulin sliding scale to sensitive scale q4hrs.      Intervention Category Intermediate Interventions: Hyperglycemia - evaluation and treatment  Elsie Lincoln 10/07/2021, 8:44 PM

## 2021-10-07 NOTE — Transfer of Care (Signed)
Immediate Anesthesia Transfer of Care Note  Patient: [name]  Procedure(s) Performed: INSERTION OF RIGHT AXILLARY IMPELLA, DECANNULATION OF ECMO, AND MEDIASTINAL WASHOUT (Chest) TRANSESOPHAGEAL ECHOCARDIOGRAM (TEE)  Patient Location: ICU  Anesthesia Type:General  Level of Consciousness: Patient remains intubated per anesthesia plan  Airway & Oxygen Therapy: Patient remains intubated per anesthesia plan and Patient placed on Ventilator (see vital sign flow sheet for setting)  Post-op Assessment: Report given to RN and Post -op Vital signs reviewed and stable  Post vital signs: Reviewed and stable  Last Vitals:  Vitals Value Taken Time  BP    Temp    Pulse 148 10/07/21 1517  Resp 20 10/07/21 1517  SpO2 94 % 10/07/21 1517  Vitals shown include unvalidated device data.  Last Pain:  Vitals:   10/06/21 0800  TempSrc: Bladder  PainSc:          Complications: No notable events documented.

## 2021-10-07 NOTE — Progress Notes (Signed)
Pharmacy Antibiotic Note  [name] is a 61 y.o. male admitted on 09/28/2021 s/p pericardial window > now with open chest/ECMO.  Pharmacy has been consulted for vancomycin and meropenem dosing.  Vancomycin random level is within range at 15 mcg/ml - drawn ~1.5 hrs after CRRT started. Hypothermic, WBC 24.  Plan: Schedule vanc 1000mg  IV Q24H  Change Merrem to 1gm IV Q8H Monitor CRRT tolerance/interruption, vanc level as indicated  Height: 5\' 7"  (170.2 cm) Weight: 108.4 kg (238 lb 15.7 oz) IBW/kg (Calculated) : 66.1  Temp (24hrs), Avg:96.8 F (36 C), Min:94.6 F (34.8 C), Max:97.2 F (36.2 C)  Recent Labs  Lab 10/01/21 0430 10/01/21 1616 10/02/21 1208 10/02/21 1607 10/04/21 0256 10/04/21 0500 10/05/21 1700 10/05/21 1857 10/06/21 0401 10/06/21 0850 10/06/21 1600 10/06/21 2155 10/07/21 0408 10/07/21 0808 10/07/21 0951 10/07/21 1106 10/07/21 1205 10/07/21 1311 10/07/21 1530 10/07/21 1743 10/07/21 1842 10/07/21 1857  WBC 16.0*   < >  --    < > 11.9*   < > 9.3  --  9.7  --  9.8  --  10.2  --   --   --   --   --  24.0*  --   --   --   CREATININE 2.47*   < >  --    < > 2.47*   < >  --    < >  --    < > 3.09*   < > 3.40*   < > 3.00* 3.20* 3.40* 3.40*  --  3.60*  --   --   LATICACIDVEN 1.2  --   --   --  1.0  --   --   --   --   --   --   --   --   --   --   --   --   --  2.6*  --   --  2.2*  VANCOTROUGH  --   --  21*  --   --   --   --   --   --   --  26*  --   --   --   --   --   --   --   --   --   --   --   VANCORANDOM  --   --   --   --   --   --   --   --   --   --   --   --   --   --   --   --   --   --   --   --  15  --    < > = values in this interval not displayed.     Estimated Creatinine Clearance: 25.3 mL/min (A) (by C-G formula based on SCr of 3.6 mg/dL (H)).    No Known Allergies  Antimicrobials this admission:  8/20 Vancomycin > 8/20 Meropenem >   Dose adjustments this admission:  8/23 VT 21 8/27 VT = 26 8/28 VR = 15 (~1.5 hr after CRRT  started)  Microbiology results:  8/24 wound: ngtd  Letricia Krinsky D. Mina Marble, PharmD, BCPS, [place] 10/07/2021, 7:39 PM

## 2021-10-08 ENCOUNTER — Inpatient Hospital Stay (HOSPITAL_COMMUNITY): Payer: Self-pay

## 2021-10-08 ENCOUNTER — Encounter (HOSPITAL_COMMUNITY): Payer: Self-pay | Admitting: Surgery

## 2021-10-08 DIAGNOSIS — J9601 Acute respiratory failure with hypoxia: Secondary | ICD-10-CM

## 2021-10-08 DIAGNOSIS — I484 Atypical atrial flutter: Secondary | ICD-10-CM

## 2021-10-08 DIAGNOSIS — R570 Cardiogenic shock: Secondary | ICD-10-CM

## 2021-10-08 LAB — BPAM PLATELET PHERESIS
Blood Product Expiration Date: 202308282359
Blood Product Expiration Date: 202308302359
Blood Product Expiration Date: 202308312359
ISSUE DATE / TIME: 202308280825
ISSUE DATE / TIME: 202308280911
ISSUE DATE / TIME: 202308281803
Unit Type and Rh: 600
Unit Type and Rh: 6200
Unit Type and Rh: 5100

## 2021-10-08 LAB — POCT I-STAT 7, (LYTES, BLD GAS, ICA,H+H)
Acid-Base Excess: 0 mmol/L (ref 0.0–2.0)
Acid-Base Excess: 0 mmol/L (ref 0.0–2.0)
Acid-Base Excess: 0 mmol/L (ref 0.0–2.0)
Acid-base deficit: 2 mmol/L (ref 0.0–2.0)
Bicarbonate: 23.6 mmol/L (ref 20.0–28.0)
Bicarbonate: 26.2 mmol/L (ref 20.0–28.0)
Bicarbonate: 25.4 mmol/L (ref 20.0–28.0)
Bicarbonate: 24.5 mmol/L (ref 20.0–28.0)
Calcium, Ion: 1.14 mmol/L — ABNORMAL LOW (ref 1.15–1.40)
Calcium, Ion: 1.03 mmol/L — ABNORMAL LOW (ref 1.15–1.40)
Calcium, Ion: 1.05 mmol/L — ABNORMAL LOW (ref 1.15–1.40)
Calcium, Ion: 1.04 mmol/L — ABNORMAL LOW (ref 1.15–1.40)
HCT: 23 % — ABNORMAL LOW (ref 39.0–52.0)
HCT: 26 % — ABNORMAL LOW (ref 39.0–52.0)
HCT: 26 % — ABNORMAL LOW (ref 39.0–52.0)
HCT: 24 % — ABNORMAL LOW (ref 39.0–52.0)
Hemoglobin: 7.8 g/dL — ABNORMAL LOW (ref 13.0–17.0)
Hemoglobin: 8.8 g/dL — ABNORMAL LOW (ref 13.0–17.0)
Hemoglobin: 8.8 g/dL — ABNORMAL LOW (ref 13.0–17.0)
Hemoglobin: 8.2 g/dL — ABNORMAL LOW (ref 13.0–17.0)
O2 Saturation: 93 %
O2 Saturation: 100 %
O2 Saturation: 98 %
O2 Saturation: 98 %
Patient temperature: 35.8
Patient temperature: 36.3
Patient temperature: 36.4
Potassium: 4.6 mmol/L (ref 3.5–5.1)
Potassium: 4.7 mmol/L (ref 3.5–5.1)
Potassium: 4.7 mmol/L (ref 3.5–5.1)
Potassium: 4.5 mmol/L (ref 3.5–5.1)
Sodium: 145 mmol/L (ref 135–145)
Sodium: 141 mmol/L (ref 135–145)
Sodium: 141 mmol/L (ref 135–145)
Sodium: 139 mmol/L (ref 135–145)
TCO2: 25 mmol/L (ref 22–32)
TCO2: 28 mmol/L (ref 22–32)
TCO2: 27 mmol/L (ref 22–32)
TCO2: 26 mmol/L (ref 22–32)
pCO2 arterial: 42.4 mmHg (ref 32–48)
pCO2 arterial: 44.8 mmHg (ref 32–48)
pCO2 arterial: 44.5 mmHg (ref 32–48)
pCO2 arterial: 40.6 mmHg (ref 32–48)
pH, Arterial: 7.349 — ABNORMAL LOW (ref 7.35–7.45)
pH, Arterial: 7.371 (ref 7.35–7.45)
pH, Arterial: 7.362 (ref 7.35–7.45)
pH, Arterial: 7.388 (ref 7.35–7.45)
pO2, Arterial: 66 mmHg — ABNORMAL LOW (ref 83–108)
pO2, Arterial: 225 mmHg — ABNORMAL HIGH (ref 83–108)
pO2, Arterial: 108 mmHg (ref 83–108)
pO2, Arterial: 104 mmHg (ref 83–108)

## 2021-10-08 LAB — CBC
HCT: 28.1 % — ABNORMAL LOW (ref 39.0–52.0)
HCT: 25.1 % — ABNORMAL LOW (ref 39.0–52.0)
Hemoglobin: 9.5 g/dL — ABNORMAL LOW (ref 13.0–17.0)
Hemoglobin: 8.6 g/dL — ABNORMAL LOW (ref 13.0–17.0)
MCH: 30 pg (ref 26.0–34.0)
MCH: 30.6 pg (ref 26.0–34.0)
MCHC: 33.8 g/dL (ref 30.0–36.0)
MCHC: 34.3 g/dL (ref 30.0–36.0)
MCV: 88.6 fL (ref 80.0–100.0)
MCV: 89.3 fL (ref 80.0–100.0)
Platelets: 153 10*3/uL (ref 150–400)
Platelets: 176 10*3/uL (ref 150–400)
RBC: 3.17 MIL/uL — ABNORMAL LOW (ref 4.22–5.81)
RBC: 2.81 MIL/uL — ABNORMAL LOW (ref 4.22–5.81)
RDW: 16.5 % — ABNORMAL HIGH (ref 11.5–15.5)
RDW: 16.9 % — ABNORMAL HIGH (ref 11.5–15.5)
WBC: 16 10*3/uL — ABNORMAL HIGH (ref 4.0–10.5)
WBC: 16 10*3/uL — ABNORMAL HIGH (ref 4.0–10.5)
nRBC: 0.1 % (ref 0.0–0.2)
nRBC: 0.1 % (ref 0.0–0.2)

## 2021-10-08 LAB — BPAM CRYOPRECIPITATE
Blood Product Expiration Date: 202308290000
ISSUE DATE / TIME: 202308281828
Unit Type and Rh: 5100

## 2021-10-08 LAB — BPAM FFP
Blood Product Expiration Date: 202308312359
Blood Product Expiration Date: 202308312359
ISSUE DATE / TIME: 202308281803
ISSUE DATE / TIME: 202308281803
Unit Type and Rh: 5100
Unit Type and Rh: 6200

## 2021-10-08 LAB — PREPARE FRESH FROZEN PLASMA
Unit division: 0
Unit division: 0

## 2021-10-08 LAB — RENAL FUNCTION PANEL
Albumin: 2.6 g/dL — ABNORMAL LOW (ref 3.5–5.0)
Albumin: 2.5 g/dL — ABNORMAL LOW (ref 3.5–5.0)
Anion gap: 9 (ref 5–15)
Anion gap: 12 (ref 5–15)
BUN: 51 mg/dL — ABNORMAL HIGH (ref 8–23)
BUN: 39 mg/dL — ABNORMAL HIGH (ref 8–23)
CO2: 23 mmol/L (ref 22–32)
CO2: 25 mmol/L (ref 22–32)
Calcium: 7.8 mg/dL — ABNORMAL LOW (ref 8.9–10.3)
Calcium: 8 mg/dL — ABNORMAL LOW (ref 8.9–10.3)
Chloride: 107 mmol/L (ref 98–111)
Chloride: 104 mmol/L (ref 98–111)
Creatinine, Ser: 2.67 mg/dL — ABNORMAL HIGH (ref 0.61–1.24)
Creatinine, Ser: 2.34 mg/dL — ABNORMAL HIGH (ref 0.61–1.24)
GFR, Estimated: 26 mL/min — ABNORMAL LOW (ref >60)
GFR, Estimated: 31 mL/min — ABNORMAL LOW (ref >60)
Glucose, Bld: 253 mg/dL — ABNORMAL HIGH (ref 70–99)
Glucose, Bld: 295 mg/dL — ABNORMAL HIGH (ref 70–99)
Phosphorus: 4.8 mg/dL — ABNORMAL HIGH (ref 2.5–4.6)
Phosphorus: 3.8 mg/dL (ref 2.5–4.6)
Potassium: 4.6 mmol/L (ref 3.5–5.1)
Potassium: 4.6 mmol/L (ref 3.5–5.1)
Sodium: 139 mmol/L (ref 135–145)
Sodium: 141 mmol/L (ref 135–145)

## 2021-10-08 LAB — GLUCOSE, CAPILLARY
Glucose-Capillary: 241 mg/dL — ABNORMAL HIGH (ref 70–99)
Glucose-Capillary: 244 mg/dL — ABNORMAL HIGH (ref 70–99)
Glucose-Capillary: 256 mg/dL — ABNORMAL HIGH (ref 70–99)
Glucose-Capillary: 273 mg/dL — ABNORMAL HIGH (ref 70–99)
Glucose-Capillary: 275 mg/dL — ABNORMAL HIGH (ref 70–99)

## 2021-10-08 LAB — AEROBIC/ANAEROBIC CULTURE W GRAM STAIN (SURGICAL/DEEP WOUND)
Culture: NO GROWTH
Gram Stain: NONE SEEN

## 2021-10-08 LAB — PREPARE PLATELET PHERESIS
Unit division: 0
Unit division: 0
Unit division: 0

## 2021-10-08 LAB — LACTATE DEHYDROGENASE: LDH: 439 U/L — ABNORMAL HIGH (ref 98–192)

## 2021-10-08 LAB — LACTIC ACID, PLASMA: Lactic Acid, Venous: 1.8 mmol/L (ref 0.5–1.9)

## 2021-10-08 LAB — COOXEMETRY PANEL
Carboxyhemoglobin: 1.4 % (ref 0.5–1.5)
Carboxyhemoglobin: 2.3 % — ABNORMAL HIGH (ref 0.5–1.5)
Methemoglobin: <0.7 % (ref 0.0–1.5)
Methemoglobin: <0.7 % (ref 0.0–1.5)
O2 Saturation: 83.1 %
O2 Saturation: 84 %
Total hemoglobin: 9.3 g/dL — ABNORMAL LOW (ref 12.0–16.0)
Total hemoglobin: 9.4 g/dL — ABNORMAL LOW (ref 12.0–16.0)

## 2021-10-08 LAB — MAGNESIUM
Magnesium: 2.1 mg/dL (ref 1.7–2.4)
Magnesium: 2.2 mg/dL (ref 1.7–2.4)

## 2021-10-08 LAB — HEMOGLOBIN AND HEMATOCRIT, BLOOD
HCT: 28.4 % — ABNORMAL LOW (ref 39.0–52.0)
Hemoglobin: 9.7 g/dL — ABNORMAL LOW (ref 13.0–17.0)

## 2021-10-08 LAB — PROTIME-INR
INR: 1.6 — ABNORMAL HIGH (ref 0.8–1.2)
Prothrombin Time: 18.7 seconds — ABNORMAL HIGH (ref 11.4–15.2)

## 2021-10-08 LAB — CALCIUM, IONIZED: Calcium, Ionized, Serum: 5 mg/dL (ref 4.5–5.6)

## 2021-10-08 LAB — PREPARE CRYOPRECIPITATE: Unit division: 0

## 2021-10-08 MED ORDER — AMIODARONE LOAD VIA INFUSION
150.0000 mg | Freq: Once | INTRAVENOUS | Status: AC
  Administered 2021-10-08: 150 mg via INTRAVENOUS
  Filled 2021-10-08: qty 83.34

## 2021-10-08 MED ORDER — PIVOT 1.5 CAL PO LIQD
1000.0000 mL | ORAL | Status: DC
  Administered 2021-10-08 – 2021-10-10 (×6): 1000 mL

## 2021-10-08 MED ORDER — INSULIN DETEMIR 100 UNIT/ML ~~LOC~~ SOLN
5.0000 [IU] | Freq: Two times a day (BID) | SUBCUTANEOUS | Status: DC
Start: 2021-10-08 — End: 2021-10-08

## 2021-10-08 MED ORDER — INSULIN DETEMIR 100 UNIT/ML ~~LOC~~ SOLN
30.0000 [IU] | Freq: Two times a day (BID) | SUBCUTANEOUS | Status: DC
Start: 2021-10-08 — End: 2021-10-09
  Administered 2021-10-08 (×2): 30 [IU] via SUBCUTANEOUS
  Filled 2021-10-08 (×5): qty 0.3

## 2021-10-08 MED ORDER — PROSOURCE TF20 ENFIT COMPATIBL EN LIQD
60.0000 mL | Freq: Four times a day (QID) | ENTERAL | Status: DC
  Administered 2021-10-08 – 2021-10-10 (×9): 60 mL
  Filled 2021-10-08 (×8): qty 60

## 2021-10-08 MED FILL — Medication: Qty: 1 | Status: AC

## 2021-10-08 NOTE — Progress Notes (Signed)
RT in patient room for rounds and routinely suctioned the patient. The patient then suddenly became hypoxic and hypotensive and was not getting tidal volumes on ventilator. Despite manual bagging and titration of vasopressors, SBP quickly dropped from 100's to a non pulsatile SBP 30, confirmed on impella waveform (see RT note and MAR for titrations). Gentle manual compressions were performed on the L lateral side of the patient's chest by this RN d/t patient having an open sternum until patient regained pulsatility on arterial line (less than 1 minute). Dr. James Ivanoff with [facility] was present in the room during these above events. Labs sent, ABG and STAT CXR completed.  I contacted cardiology regarding these above events to make him aware. Impella site looks stable, continues to measure at 45cm. Flows remain ~2.0 and waveforms look the same as prior to above event. MD said to contact him again if we were to need any assistance.

## 2021-10-08 NOTE — Progress Notes (Signed)
At bedside during routine rounds and care. Attempted to suction patient, got back smear/scant amount of clear thin secretions. Soon after suctioning, patient saturations dropped into the low 80's, accompanied with very low lung volumes both inspiratory and expiratory on the vent. Also patient became profoundly hypotensive systolic arterial line read 40's before rapidly dampening to unreadable. Patient was immediately bagged ventilated and saturations return to acceptable range. Patient required increase titrate of vasoactive agent (Epi and Levo refer to [facility]) to achieve acceptable blood pressure. Patient was placed back on the vent and was getting good volumes without any further complications. Patient remain in sinus tachy throughout the event. Patient never became bradycardic or lost pulses.   RN at bedside and DR James Ivanoff was made aware of event. Patient is clinically stable at this time, will continue to monitor patient clinical presentation, respiratory status, and all hemodynamic parameters.   Letta Cargile L. Tamala Julian, BS, RRT-ACCS, RCP

## 2021-10-08 NOTE — Progress Notes (Addendum)
Patient ID: [name], male   DOB: 10/10/1960, 61 y.o.   MRN: [id number]    Advanced Heart Failure Rounding Note   Subjective:    - 8/19 Pericardial window - 8/20 Cardiac arrest with tamponade -> Emergent bedside washout - 09/29/21 VA Cannulation - 09/30/21 Return to OR for mediastinal hemorrhage - 10/01/21 Developed AF -> amio - 10/02/21 TEE EF 25-30%  - 10/04/21 Back to OR for washout. C/b continued bleeding overnight - 10/07/21 Back to OR, placement of Impella 5.5 with washout, VA ECMO decannulation. Hypotensive with development of severe RV dysfunction after ECMO turned off and pressors titrated up, HCO3 given with ultimate recovery of pressure. Multiple units of blood products.  Remains intubated. Developed hypoxia and hypotension early am. Nonpulsatile Impella waveform noted. Manual compression performed L lateral side of chest d/t open sternum. Regained pulsatility on arterial line after less than 1 minute. Briefly required increase in pressors.  Currently on Epi 3, Milrinone 0.375, NE 32, Vaso 0.04  CVP 9. On CRRT, pulling for net even. On bicarb gtt.  Afib with RVR yesterday afternoon converted with IV amio. ? AFL this am, rates 110s-120s, on amio gtt at 30/hr   Impella 5.5 P4 Flow 2.4 L/min   Echo 08/28: LV EF 55-60% with severe LVH, small ventricular cavity, RV appears to have near normal systolic function   Objective:   Weight Range:  Vital Signs:   Temp:  [94.1 F (34.5 C)-97.7 F (36.5 C)] 97.7 F (36.5 C) (08/29 0700) Pulse Rate:  [87-139] 113 (08/29 0746) Resp:  [0-31] 20 (08/29 0746) BP: (122)/(52) 122/52 (08/28 1837) SpO2:  [77 %-100 %] 98 % (08/29 0746) Arterial Line BP: (69-138)/(45-72) 105/59 (08/29 0700) FiO2 (%):  [50 %-70 %] 70 % (08/29 0746) Weight:  [112.8 kg] 112.8 kg (08/29 0500) Last BM Date : 10/07/21  Weight change: Filed Weights   10/04/21 0500 10/05/21 0630 10/08/21 0500  Weight: 105.4 kg 108.4 kg 112.8 kg     Intake/Output:   Intake/Output Summary (Last 24 hours) at 10/08/2021 8101 Last data filed at 10/08/2021 0700 Gross per 24 hour  Intake 10102.53 ml  Output 6957 ml  Net 3145.53 ml     Physical Exam: General:  Critically ill appearing. Sedated on vent. HEENT: + ETT Neck: supple. JVP 8-10. Carotids 2+ bilat; no bruits.  Cor: Chest open. Irregular rhythm, tachy. No rubs, gallops or murmurs. + mediastinal and CT. Lungs: clear Abdomen: soft, nontender, nondistended, hypoactive BS Extremities: no cyanosis, clubbing, rash, 2+ edema Neuro: sedated on vent   Telemetry: ? Atrial flutter 110s-120s   Labs: Basic Metabolic Panel: Recent Labs  Lab 10/01/21 1616 10/01/21 1623 10/02/21 0354 10/02/21 0528 10/02/21 2124 10/03/21 0408 10/03/21 0414 10/03/21 0905 10/04/21 0256 10/04/21 0623 10/06/21 1600 10/06/21 1929 10/06/21 2155 10/07/21 0408 10/07/21 0417 10/07/21 1205 10/07/21 1208 10/07/21 1311 10/07/21 1316 10/07/21 1730 10/07/21 1738 10/07/21 1743 10/07/21 1814 10/08/21 0349 10/08/21 0350 10/08/21 0559  NA 141   < > 140   < > 142   < > 141   < > 144   < > 144   < > 145 148*   < > 146*   < > 144   < > 146*   < > 145 145 141 139 141  K 4.3   < > 4.2   < > 3.7   < > 4.2   < > 4.5   < > 4.0   < > 4.0 3.8   < >  3.9   < > 4.9   < > 4.2   < > 4.6 4.6 4.7 4.6 4.7  CL 108  --  109   < > 110  --  111   < > 110   < > 114*  --  115* 115*   < > 110  --  112*  --  111  --  109  --   --  107  --   CO2 28  --  26   < > 24  --  26   < > 28   < > 25  --  27 25  --   --   --   --   --  22  --   --   --   --  23  --   GLUCOSE 183*  --  177*   < > 162*  --  149*   < > 142*   < > 139*  --  165* 135*   < > 132*  --  203*  --  236*  --  274*  --   --  253*  --   BUN 43*  --  47*   < > 51*  --  53*   < > 50*   < > 72*  --  76* 78*   < > 74*  --  73*  --  75*  --  70*  --   --  51*  --   CREATININE 2.62*  --  2.77*   < > 2.71*  --  2.78*   < > 2.47*   < > 3.09*  --  3.27* 3.40*   < > 3.40*   --  3.40*  --  3.47*  --  3.60*  --   --  2.67*  --   CALCIUM 7.4*  --  7.4*   < > 7.1*  --  7.3*   < > 7.2*   < > 7.9*  --  8.0* 8.2*  --   --   --   --   --  8.2*  --   --   --   --  7.8*  --   MG 2.8*  --  2.7*  --  2.5*  --  2.6*  --  2.3  --   --   --   --   --   --   --   --   --   --   --   --   --   --   --  2.1  --   PHOS 4.3  --  4.2  --   --   --  3.6  --   --   --   --   --   --   --   --   --   --   --   --   --   --   --   --   --  4.8*  --    < > = values in this interval not displayed.    Liver Function Tests: Recent Labs  Lab 10/03/21 0414 10/04/21 0256 10/05/21 0441 10/06/21 1600 10/07/21 0408 10/08/21 0350  AST 46* 22 28 99* 97*  --   ALT 18 12 11 10 15   --   ALKPHOS 116 43 37* 73 89  --   BILITOT 1.1 1.1 1.0 2.2* 1.1  --   PROT 4.1* 3.7* 3.7* 3.9* 4.1*  --  ALBUMIN 2.0* 2.0* 2.0* 1.9* 1.9* 2.6*   No results for input(s): "LIPASE", "AMYLASE" in the last 168 hours. No results for input(s): "AMMONIA" in the last 168 hours.  CBC: Recent Labs  Lab 10/06/21 0401 10/06/21 0409 10/06/21 0609 10/06/21 0746 10/06/21 1600 10/06/21 1929 10/07/21 0408 10/07/21 0417 10/07/21 1530 10/07/21 1532 10/07/21 1835 10/08/21 0030 10/08/21 0349 10/08/21 0350 10/08/21 0559  WBC 9.7  --   --   --  9.8  --  10.2  --  24.0*  --   --   --   --  16.0*  --   HGB 8.4*   < >  --    < > 7.7*   < > 9.1*   < > 10.6*   < > 7.8* 9.7* 8.8* 9.5* 8.8*  HCT 24.6*   < >  --    < > 22.7*   < > 27.8*   < > 31.7*   < > 23.6* 28.4* 26.0* 28.1* 26.0*  MCV 87.5  --   --   --  90.1  --  89.1  --  88.1  --   --   --   --  88.6  --   PLT 74*  --  69*  --  72*  --  71*  --  135*  --   --   --   --  153  --    < > = values in this interval not displayed.    Cardiac Enzymes: No results for input(s): "CKTOTAL", "CKMB", "CKMBINDEX", "TROPONINI" in the last 168 hours.  BNP: BNP (last 3 results) Recent Labs    12/24/20 0651 09/28/21 0405  BNP 412.0* 826.6*    ProBNP (last 3 results) No  results for input(s): "PROBNP" in the last 8760 hours.    Other results:  Imaging: DG CHEST PORT 1 VIEW  Result Date: 10/08/2021 CLINICAL DATA:  Check endotracheal tube placement EXAM: PORTABLE CHEST 1 VIEW COMPARISON:  09/17/2021 FINDINGS: Endotracheal tube is noted in satisfactory position. Feeding catheter extends into the stomach. Left thoracostomy tubes and pericardial drain are seen and stable. Impella catheter as well as bilateral jugular central lines are noted and stable. Postsurgical changes are seen consistent with bypass grafting. No focal infiltrate or effusion is noted. IMPRESSION: Tubes and lines in satisfactory position. No acute abnormality noted. Electronically Signed   By: Inez Catalina M.D.   On: 10/08/2021 04:01   DG Chest Port 1 View  Result Date: 10/07/2021 CLINICAL DATA:  Heart failure. EXAM: PORTABLE CHEST 1 VIEW COMPARISON:  Same day. FINDINGS: Stable cardiomegaly. Status post coronary bypass graft. Right internal jugular catheter is noted with tip in expected position of the SVC. Feeding tube is seen entering stomach. Tracheostomy tube is in good position. Left-sided chest tube is noted without pneumothorax. Bibasilar atelectasis and effusions are noted. Bony thorax is unremarkable. There is been interval placement of Impella device from right subclavian approach. IMPRESSION: Interval placement of Impella device from right subclavian approach. Left-sided chest tube is noted without pneumothorax. Stable bibasilar atelectasis and effusions are noted. Electronically Signed   By: Marijo Conception M.D.   On: 10/07/2021 15:57   DG C-Arm 1-60 Min-No Report  Result Date: 10/07/2021 Fluoroscopy was utilized by the requesting physician.  No radiographic interpretation.   DG C-Arm 1-60 Min-No Report  Result Date: 10/07/2021 Fluoroscopy was utilized by the requesting physician.  No radiographic interpretation.   DG C-Arm 1-60 Min-No Report  Result Date: 10/07/2021 Fluoroscopy  was utilized by the requesting physician.  No radiographic interpretation.   DG Chest Port 1 View  Result Date: 10/07/2021 CLINICAL DATA:  ECMO, ETT present EXAM: PORTABLE CHEST 1 VIEW COMPARISON:  10/06/2021 FINDINGS: No significant change in AP portable chest radiograph. Cardiomegaly. Support apparatus including endotracheal tube, left and right neck vascular catheters, left-sided chest and mediastinal drainage tubes, and arteriovenous ECMO cannula are unchanged. Probable small layering pleural effusions. IMPRESSION: 1. No significant change in AP portable chest radiograph. 2. Unchanged support apparatus as above. 3. Probable small layering pleural effusions. No new airspace opacity. Electronically Signed   By: Delanna Ahmadi M.D.   On: 10/07/2021 08:18     Medications:     Scheduled Medications:  acetaminophen  1,000 mg Oral Q6H   Or   acetaminophen (TYLENOL) oral liquid 160 mg/5 mL  1,000 mg Per Tube Q6H   acetaminophen (TYLENOL) oral liquid 160 mg/5 mL  650 mg Per Tube Once   Or   acetaminophen  650 mg Rectal Once   atorvastatin  80 mg Per Tube Daily   chlorhexidine  15 mL Mouth/Throat NOW   Chlorhexidine Gluconate Cloth  6 each Topical Daily   clonazePAM  1 mg Per Tube Q8H   colchicine  0.6 mg Per Tube Daily   insulin aspart  0-9 Units Subcutaneous Q4H   metoCLOPramide (REGLAN) injection  10 mg Intravenous Q6H   mouth rinse  15 mL Mouth Rinse Q2H   pantoprazole (PROTONIX) IV  40 mg Intravenous QHS   polyethylene glycol  17 g Per Tube Daily   QUEtiapine  50 mg Per Tube BID   sodium chloride flush  10-40 mL Intracatheter Q12H   sodium chloride flush  3 mL Intravenous Q12H    Infusions:   prismasol BGK 4/2.5 400 mL/hr at 10/08/21 0607    prismasol BGK 4/2.5 400 mL/hr at 10/08/21 0606   albumin human     albumin human 60 mL/hr at 10/08/21 0700   amiodarone 60 mg/hr (10/08/21 0700)   epinephrine 3 mcg/min (10/08/21 0700)   HYDROmorphone 4 mg/hr (10/08/21 0700)   lactated  ringers     lactated ringers 20 mL/hr at 10/08/21 0700   meropenem (MERREM) IV Stopped (10/08/21 0238)   midazolam 8 mg/hr (10/08/21 0700)   milrinone 0.375 mcg/kg/min (10/08/21 0700)   nitroGLYCERIN Stopped (10/07/21 1556)   norepinephrine (LEVOPHED) Adult infusion 32 mcg/min (10/08/21 0700)   prismasol BGK 4/2.5 1,500 mL/hr at 10/08/21 0706   sodium bicarbonate 25 mEq (Impella PURGE) in dextrose 5 % 1000 mL bag     sodium bicarbonate 75 mEq in sodium chloride 0.45 % 1,075 mL infusion 125 mL/hr at 10/08/21 0700   vancomycin Stopped (10/07/21 2141)   vasopressin 0.04 Units/min (10/08/21 0700)    PRN Medications: albumin human, albumin human, artificial tears, dextrose, heparin, HYDROmorphone, midazolam, midazolam, ondansetron (ZOFRAN) IV, ondansetron (ZOFRAN) IV, sodium chloride, sodium chloride flush, sodium chloride flush   Assessment/Plan:    1. Shock - mixed cardiogenic/hemorrhagic -> VA ECMO -> decannulated on 8/28 to Impella 5.5 - Echo 08/28: Underfilled LV, EF 55-60% with severe LVH and near normal RV systolic function.  - Impella remains at P4, Flow 2.  - On Epi 3, NE 32, Mil 0.375, vaso 0.04. Checking CO-OX. - CVP 9. Continues on CRRT to manage volume.  2. Cardiac arrest (PEA/bradycardic) - 8/19 in setting of tamponade - lost pulsatile flow on Impella overnight/early am. Restored after ~ 1 minute lateral chest compressions.  3. Cardiac tamponade  with emergent bedside sternotomy  - Diffuse epicardial bleeding with post-op Dresslers - return to OR 8/21 and 8/24 for washouts.  - Washout 8/28 in OR.  - Multiple units of blood products in OR overnight. Hgb 9.5 this am.  - Not currently on heparin d/t bleeding  4. Acute hypoxemic respiratory failure - Off ECMO.  - Vent support per CCM  5. AKI due to ATN - CVVH started 08/28. 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 Continues on  colchicine.  7. 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 ASA held as above  8. DM2 - continue SSI  9. PAF/AFL - Tolerates poorly.  - Looks like back in AFL this am? Rate 110s-120s - On amio gtt at 60/hr, continue  10. ID - On vancomycin/meropenem with open chest.   11. Neuro - Has followed commands with vent wean.  - Requiring high dose sedation.   12. FEN - TFs ongoing     Length of Stay: [place], LINDSAY N 10/08/2021, 8:22 AM  Advanced Heart Failure Team Pager [phone] (M-F; 7a - 4p)  Please contact [facility] for night-coverage after hours (4p -7a ) and weekends on [email or web address]   Agree with above.  Had asystolic arrest overnight. Now in AF with RVR.  Impella 5-5 at place at P-5. On Epi 3, NE 32, Mil 0.375, vaso 0.04.   Remains on CVVHD pulling -100. CVP 10.   Intubated/sedated/ Chest open   General:  Ill appearing. Intubated/sedated HEENT: normal +ETT Neck: supple. RIJ catheter Carotids 2+ bilat; no bruits. No lymphadenopathy or thryomegaly appreciated. Cor: Chest open irreg tachy  Lungs: coarse Abdomen: soft, nontender, nondistended. No hepatosplenomegaly. No bruits or masses. Good bowel sounds. Extremities: no cyanosis, clubbing, rash, 2-3+ edema Neuro: intubated/sedated  Remains critically ill. Continue impella support. Pull fluid with CVVHD. Bolus amio to try to get back in SR. Wean inotropes as tolerated. Needs volume off and more stability before we can get chest closed.   Impella interrogated personally. Parameters stable.  CRITICAL CARE Performed by: Glori Bickers  Total critical care time: 45 minutes  Critical care time was exclusive of separately billable procedures and treating other patients.  Critical care was necessary to treat or prevent imminent or life-threatening deterioration.  Critical care was time spent personally by me (independent of midlevel providers or residents) on the following activities: development of treatment  plan with patient and/or surrogate as well as nursing, discussions with consultants, evaluation of patient's response to treatment, examination of patient, obtaining history from patient or surrogate, ordering and performing treatments and interventions, ordering and review of laboratory studies, ordering and review of radiographic studies, pulse oximetry and re-evaluation of patient's condition.   Glori Bickers, MD  12:23 PM

## 2021-10-08 NOTE — Progress Notes (Signed)
Nutrition Follow-up  DOCUMENTATION CODES:   Not applicable  INTERVENTION:   Tube Feeding via Cortrak (post pyloric): Discussed with Dr. Cyndia Bent Initiate Pivot 1.5 at 20 ml/hr; titrate by 10 mL q 8 hours until goal rate of 60 ml/hr. Discussed with Bedside RN TF regimen at goal rate provides 2160 kcals, 135 g of protein and 1094 mL of free water  Until TF at goal rate, plan to continue Pro-Source TF20 60 mL QID. This provides additional 80 g of protein and 320 kcals   If unable to tolerate TF, recommend initiation of TPN given inadequate nutrition intake >7 days  Add Renal MVI daily  NUTRITION DIAGNOSIS:   Inadequate oral intake related to acute illness as evidenced by NPO status.  Being addressed via TF   GOAL:   Patient will meet greater than or equal to 90% of their needs  Progressing  MONITOR:   Vent status, Labs, Weight trends, TF tolerance  REASON FOR ASSESSMENT:   Ventilator    ASSESSMENT:   61 yo male admitted with acute on chronic CHF with recent CABG, symptomatic bradycardia and hypotension. PMH includes DM, CAD s/p recent CABG x 5, CKD 3, CVA  8/19 Pericardial window, evacuation of pericardial and pleural effusion 8/20 Intubated, Bedside mediastinal re-exploration, cardiac tamponade, mediastinal clot; PEA arrest due to tamponade, VA ECMO cannulation 8/21 Return to OR for bleeding, mediastinal re-exploration, Cortrak placed-gastric 8/23 TEE with EF 25-30% 8/25 Cortrak advanced to Post-Pyloric position 8/28 VA ECMO decannulation, Impella placed, mediastinal washout, CRRT initiated 8/29 Brief arrest  Pt remains on vent support, chest open, on CRRT Levophed at 40, Epinephrine at 5, Vasopressin 1.61  Diastolic consistently >09, MAP >60, titrating for SBP <90  Pt with inadequate nutrition since admission. TF held completely yesterday for OR and not restarted. Currently TF not infusing. Tolerated initiation of trickle TF on Friday post TF advancement, TF not  increased over the weekend.   Weight up to 112.8 kg; admit weight 101 kg. Net +15 L  Labs: CBGs 241-274, Creatinine 2.67, BUN 51, phosphorus 4.8 Meds: ss novolog, levemir, reglan, miralax,   Diet Order:   Diet Order             Diet NPO time specified  Diet effective now                   EDUCATION NEEDS:   Not appropriate for education at this time  Skin:  Skin Assessment: Skin Integrity Issues: Skin Integrity Issues:: Incisions, Other (Comment) Incisions: open chest, VA ECMO Other: dehisced wound (previous surgical incision) to R medial pretibial  Last BM:  8/29 rectal tube  Height:   Ht Readings from Last 1 Encounters:  09/29/21 5\' 7"  (1.702 m)    Weight:   Wt Readings from Last 1 Encounters:  10/08/21 112.8 kg    BMI:  Body mass index is 38.95 kg/m.  Estimated Nutritional Needs:   Kcal:  2000-2200 kcals  Protein:  130-155 g  Fluid:  1.8 L  Kerman Passey MS, RDN, LDN, CNSC Registered Dietitian 3 Clinical Nutrition RD Pager and On-Call Pager Number Located in [place]

## 2021-10-08 NOTE — Progress Notes (Signed)
1 Day Post-Op Procedure(s) (LRB): INSERTION OF RIGHT AXILLARY IMPELLA, DECANNULATION OF ECMO, AND MEDIASTINAL WASHOUT (N/A) TRANSESOPHAGEAL ECHOCARDIOGRAM (TEE) Subjective:  Had brief episode of sudden hypoxemia and profound hypotension overnight of unclear etiology. Sounded respiratory with desaturation but no mucous plugging. Recovered quickly with bagging and increase in vasopressors.  Remains on milrinone 0.375, epi 3, NE 32 and Vaso 0.04 Impella up to P5 this am. CVP 9-10  Amiodarone drip for atrial fib with RVR to try to maintain sinus rhythm.  CRRT started last night and removing some volume.   Intubated on vent.  Had significant chest tube output postop but decreased to minimal with clotting factors and TXA.  Objective: Vital signs in last 24 hours: Temp:  [94.1 F (34.5 C)-97.7 F (36.5 C)] 97.7 F (36.5 C) (08/29 0700) Pulse Rate:  [87-139] 113 (08/29 0746) Cardiac Rhythm: Atrial fibrillation (08/29 0400) Resp:  [0-31] 20 (08/29 0746) BP: (122)/(52) 122/52 (08/28 1837) SpO2:  [77 %-100 %] 98 % (08/29 0746) Arterial Line BP: (69-138)/(45-72) 105/59 (08/29 0700) FiO2 (%):  [50 %-70 %] 70 % (08/29 0746) Weight:  [112.8 kg] 112.8 kg (08/29 0500)  Hemodynamic parameters for last 24 hours: CVP:  [7 mmHg-12 mmHg] 9 mmHg  Intake/Output from previous day: 08/28 0701 - 08/29 0700 In: 10102.5 [I.V.:4264.5; Blood:3850; NG/GT:230; IV Piggyback:1611.7] Out: 8469 [Urine:790; Drains:700; Chest Tube:1330] Intake/Output this shift: Total I/O In: 257.6 [I.V.:257.6] Out: 194   General appearance: intubated and sedated. Anasarca. Neurologic: unable to assess Heart: irregularly irregular rhythm Lungs: clear to auscultation bilaterally Abdomen: soft, obese, no BS Extremities: warm Wound: open chest dressing ok  Lab Results: Recent Labs    10/07/21 1530 10/07/21 1532 10/08/21 0350 10/08/21 0559  WBC 24.0*  --  16.0*  --   HGB 10.6*   < > 9.5* 8.8*  HCT 31.7*   < >  28.1* 26.0*  PLT 135*  --  153  --    < > = values in this interval not displayed.   BMET:  Recent Labs    10/07/21 1730 10/07/21 1738 10/07/21 1743 10/07/21 1814 10/08/21 0350 10/08/21 0559  NA 146*   < > 145   < > 139 141  K 4.2   < > 4.6   < > 4.6 4.7  CL 111  --  109  --  107  --   CO2 22  --   --   --  23  --   GLUCOSE 236*  --  274*  --  253*  --   BUN 75*  --  70*  --  51*  --   CREATININE 3.47*  --  3.60*  --  2.67*  --   CALCIUM 8.2*  --   --   --  7.8*  --    < > = values in this interval not displayed.    PT/INR:  Recent Labs    10/08/21 0350  LABPROT 18.7*  INR 1.6*   ABG    Component Value Date/Time   PHART 7.362 10/08/2021 0559   HCO3 25.4 10/08/2021 0559   TCO2 27 10/08/2021 0559   ACIDBASEDEF 2.0 10/07/2021 1814   O2SAT 83.1 10/08/2021 0851   CBG (last 3)  Recent Labs    10/08/21 0026 10/08/21 0348 10/08/21 0908  GLUCAP 256* 244* 241*   CXR: stable. Lungs relatively clear.  Assessment/Plan: S/P Procedure(s) (LRB): INSERTION OF RIGHT AXILLARY IMPELLA, DECANNULATION OF ECMO, AND MEDIASTINAL WASHOUT (N/A) TRANSESOPHAGEAL ECHOCARDIOGRAM (TEE)  He remains critically  ill with multisystem organ failure with cardiogenic/vasogenic shock requiring multiple vasopressors and inotropic agents as well as Impella. Echo last pm showed severe LVH with EF 55-60% and improved RV function. The goal at this time is to continue CRRT for metabolic cleansing and hopefully volume removal as BP allows. He will not be able to have his chest closed until his excess volume is significantly decreased.  PAF and atrial flutter with RVR that is not well-tolerated. He likes to be in sinus rhythm. Continuing amio. Dr. Haroldine Laws performed DCCV this pm but did not maintain sinus for long.  Chest tube output now minimal. Keep tubes in for now.   Impella site ok. Continue JP.     LOS: 10 days    [name] 10/08/2021

## 2021-10-08 NOTE — Progress Notes (Signed)
Patient ID: [name], male   DOB: 01/21/1961, 61 y.o.   MRN: [id number]  TCTS Evening Rounds:  Remains on NE 40, vasopressin 0.04 and epi 5 to support BP. Impella function stable. Milrinone is 0.375 and with Co-ox 83 this am and 84 this pm will decrease to 0.25 now and then 0.125 later if things remain stable to see if that is contributing to vasodilation. May also be contributing to tachycardia.  Trying to remove 50/hr with CRRT if possible.  BMET    Component Value Date/Time   NA 141 10/08/2021 1515   NA 135 02/13/2021 0855   K 4.6 10/08/2021 1515   CL 104 10/08/2021 1515   CO2 25 10/08/2021 1515   GLUCOSE 295 (H) 10/08/2021 1515   BUN 39 (H) 10/08/2021 1515   BUN 9 02/13/2021 0855   CREATININE 2.34 (H) 10/08/2021 1515   CALCIUM 8.0 (L) 10/08/2021 1515   EGFR 45 (L) 02/13/2021 0855   GFRNONAA 31 (L) 10/08/2021 1515   CBC    Component Value Date/Time   WBC 16.0 (H) 10/08/2021 1515   RBC 2.81 (L) 10/08/2021 1515   HGB 8.6 (L) 10/08/2021 1515   HGB 15.4 02/13/2021 0855   HCT 25.1 (L) 10/08/2021 1515   HCT 45.7 02/13/2021 0855   PLT 176 10/08/2021 1515   PLT 239 02/13/2021 0855   MCV 89.3 10/08/2021 1515   MCV 88 02/13/2021 0855   MCH 30.6 10/08/2021 1515   MCHC 34.3 10/08/2021 1515   RDW 16.9 (H) 10/08/2021 1515   RDW 11.3 (L) 02/13/2021 0855   LYMPHSABS 2.2 09/29/2021 1956   MONOABS 2.1 (H) 09/29/2021 1956   EOSABS 0.0 09/29/2021 1956   BASOSABS 0.0 09/29/2021 1956      

## 2021-10-08 NOTE — Progress Notes (Addendum)
eLink Physician-Brief Progress Note Patient Name: [name] DOB: 1960/12/24 MRN: [id number]   Date of Service  10/08/2021  HPI/Events of Note  Notified of desaturation into the 80s. Pt with low lung volumes.  RT tried suctioning but not able to suction.  No obstruction noted.  No cuff leak noted either. Pt dropped pressures and required higher doses of levophed and epi gtts. Pt was bagged with improvement in sats.  Pt placed back on the vent.  Levophed and epinephrine gtts titrated back down.  BP 115/61, HR 106, RR 20, O2 sats 98%  eICU Interventions  Get CXR and ABG.      Intervention Category Major Interventions: Hypoxemia - evaluation and management  Elsie Lincoln 10/08/2021, 3:39 AM  5:07 AM ABG 7.371/44.8/225 and CXR showing no acute abnormality noted.   Plan> Continue to monitor for now.

## 2021-10-08 NOTE — Progress Notes (Signed)
  Has remained in AFL all day today with rates 120s+.  BP soft had to increase pressors. CRRT now running positive.   Have given multiple boluses of amiodarone without effect.   Impella flows ok  D/w Dr. Cyndia Bent and we decided to attempt DC-CV. Converted to NSR (see procedure note)  Will try to pull -50 on CRRT to deal with volume overload.   Continue IV amio.   Additional CCT > 81mins  Glori Bickers, MD  4:16 PM

## 2021-10-08 NOTE — Progress Notes (Signed)
NAME:  [name], MRN:  [id number], DOB:  1960-06-01, LOS: 69 ADMISSION DATE:  09/28/2021, CONSULTATION DATE:  09/29/21 REFERRING MD:  Dr. Kipp Brood, CHIEF COMPLAINT:  Cardiac Arrest   History of Present Illness:  [name] is a 61 year old male with DMII, , CVA, atrial fibrillation on eliquis and multivessel CAD s/p 5 vessel CABG on 09/12/21 who presented 8/19 with shortness of breath via EMS. He was found to be bradycardic and hypoglycemic. He symptomatically improved with dextrose but continued to have bradycardia and soft blood pressures. ECHO obtained which showed LVEF 50%, grade II diastolic dysfunction and moderate pericardial effusion with possible tamponade physiology. CT Chest showed pericardial effusion and small bilateral pleural effusions. He was taken to the OR 8/19 for pericardial window.   Patient started having issues with tachy-brady arrhythmias on telemetry, decreased urine output and increased work of breathing over night. A 14 french pigtail catheter was placed in the left pleural space this morning with bloody drainage. At 10:35am patient became unresponsive with PEA arrest. He underwent multiple rounds of CPR with epinephrine pushes. ROSC was obtained and patient was intubated. Bedside US by cardiology showed large pericardial effusion with tamponade physiology. He lost pulse again and CPR was again performed. Please see code sheet for full details. Bedside sternotomy performed with pericardial drainage by CT surgery, immediate improvement in hemodynamics achieved with drainage of the pericardial space.   Pertinent  Medical History   Past Medical History:  Diagnosis Date   AKI (acute kidney injury) ([place])    pt unaware of this   Anginal pain ([place])    CAD (coronary artery disease)    a. 03/2015 NSTEMI: LHC with severe 3V CAD  (70% mid RCA, 95% OM1, 90% distal LCx, 90% OM3, 80% prox LAD and 90% ost D1) s/p DES to mLAD w/ small dissction Rx with DES, staged ost Ramus PCI/DES and  dLCx s/p PCI/DES    Chest pain 12/24/2020   Diabetes mellitus type 2 in obese [facility])    Diverticulosis    Dyspnea    Dyspnea on exertion 03/16/2015   Dyspnea on exertion   Family history of adverse reaction to anesthesia    patient father- pt states after anesthesia his father "developed dementia"   GERD (gastroesophageal reflux disease)    Hypercholesteremia    Hypertension associated with diabetes ([place]) 03/16/2015   hypertension   NSTEMI (non-ST elevated myocardial infarction) ([place]) 03/17/2015   Obesity    Stroke ([place]) 2022   pt states he had a "mini stroke" during cardiac catheterization   Tobacco abuse    Significant Hospital Events: Including procedures, antibiotic start and stop dates in addition to other pertinent events   8/19 admitted, s/p pericardial window 8/20 PEA cardiac arrest, left pigtail chest tube placement, PEA cardiac arrest due to tamponade, bedside sternotomy with pericardial drains placed. 8/21 return to OR for overnight bleeding.  8/22 minimal chest tube output.  Atrial fibrillation, controled with amiodarone.  8/22 TEE showed EF 35% at baseline, which improved significantly with decreasing ECMO flow.  8/23 tolerated diuresis  8/24 mediastinal washout, removal of hematoma 8/25 ongoing bleeding issues from posterior mediastinum 8/29 decannulated and off ECMO, 5.5 Impella placed, L femoral HD cath placed and started on CRRT 8/30 hypoxia and hypotension followed by brief arrest, gentle L lateral chest compressions performed and ROSC in roughly 1 minute.  Interim History / Subjective:  Brief arrest early AM. Tolerating CRRT well, 4L removed.  Objective   Blood pressure Marland Kitchen)  122/52, pulse (!) 113, temperature 97.7 F (36.5 C), resp. rate 20, height 5\' 7"  (1.702 m), weight 112.8 kg, SpO2 98 %. CVP:  [7 mmHg-12 mmHg] 7 mmHg  Vent Mode: PRVC FiO2 (%):  [50 %-70 %] 70 % Set Rate:  [20 bmp] 20 bmp Vt Set:  [530 mL] 530 mL PEEP:  [5 cmH20-8 cmH20] 8  cmH20 Plateau Pressure:  [21 cmH20-23 cmH20] 23 cmH20   Intake/Output Summary (Last 24 hours) at 10/08/2021 0813 Last data filed at 10/08/2021 0700 Gross per 24 hour  Intake 10102.53 ml  Output 6957 ml  Net 3145.53 ml    Filed Weights   10/04/21 0500 10/05/21 0630 10/08/21 0500  Weight: 105.4 kg 108.4 kg 112.8 kg   Examination: Constitutional: critically ill appearing man intubated, sedated HEENT: /AT, eyes anicteric. Anisicoria unchanged. Cardiovascular: sternal dressing pulsing, no significant bleeding around dressing. Mediastinal tubes in place. Respiratory: CTAB, synchronous with MV. Gastrointestinal:  BS x 4, soft, NT. Skin: warm, dry Neurologic: RASS -5, reactive pupils GU: foley draining yellow urine  Ancillary tests personally review:  7.4/48/301/29 Na +148 BUN 78 Cr 3.4 AST 97 ALT 15 WBC 10.2 H/H 9.1/27.8 Platelets 71 fibrinogen 526 LDH 1299  CXR personally reviewed> bilateral pulmonary edema,  left pleural effusion. ECMO cannulas, left sided chest tube.   Assessment & Plan:   Hemorraghic pericarditis w/ cardiac tamponade/obstructive shock causing PEA arrest s/p pericardial drain placement with subsequent cardiac stunning on VA ECMO - - weaned off VA ECMO, impella 5.5 placed 8/28  in OR with pericardial washout and ECMO decannulation S/P coronary artery disease s/p CABG x 5 8/3 Paroxysmal atrial fibrillation Acute RV failure with cardiogenic & distributive shock - Continue Impella per CHF team, currently on P4 - Vasopressors and inotropes per TCTS - AC per TCTS> held for now - Impella per Cardiology; on P4 - RN to clarify code status with TCTS (re chest compressions etc)  Acute hypoxemic and hypercapnic respiratory failure - LTVV, 4-8cc/kg IBW with goal Pplat<30 and DP<15. Increased PEEP to 8. - VAP prevention protocol - PAD protocol for sedation - SAT & SBT when appropriate; not stable enough yet  Acute metabolic encephalopathy due to sedation; has  previously been able to follow commands but was agitated. - PAD protocol for sedation; wean as able. Wean versed first.   Sepsis due to pericarditis - antibiotics- vanc & meropenem.  Acute Kidney 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 Hypervolemia Acute metabolic acidosis - resolved - Continue CRRT,  bicarb gtt for now but can likely wean.  Acute blood loss anemia- ongoing issue with any AC challenge - balanced transfusions PRN. - TEG pending.  Hx of DMII. - SSI PRN, goal BG 140-180.  Intolerance to TF - Can resume TF when vasopressors and inotrope requirements improve.  Best Practice (right click and "Reselect all SmartList Selections" daily)   Diet/type: TF on hold DVT prophylaxis: see discussion above GI prophylaxis: PPI Lines: Central line and yes and it is still needed Foley:  Yes, and it is still needed Code Status:  full code Last date of multidisciplinary goals of care discussion [Per primary]   CC time: 45 min.   Montey Hora, [facility] For pager details, please see AMION or use Epic chat  After 1900, please call [facility] for cross coverage needs 10/08/2021, 8:39 AM

## 2021-10-08 NOTE — CV Procedure (Signed)
    DIRECT CURRENT CARDIOVERSION  NAME:  PERSEUS WESTALL   MRN: [id number] DOB:  1960-10-31   ADMIT DATE: 09/28/2021   INDICATIONS: Atrial flutter   PROCEDURE:   Emergent consent. Patient already sedated on vent.  Once an appropriate time out was taken, the patient had the defibrillator pads placed in the anterior and lateral position. Once an appropriate level of sedation was achieved, the patient received a single biphasic, synchronized 200J shock with prompt conversion to sinus rhythm. No apparent complications.  Glori Bickers, MD  4:16 PM

## 2021-10-08 NOTE — Progress Notes (Signed)
[facility] NEPHROLOGY PROGRESS NOTE  Assessment/ Plan: Pt is a 61 y.o. yo male  with history of HTN, HLD, DM, A-fib, stroke, CKD, CAD multivessel dz status post CABG on 8/3 presented with shortness of breath, found to have cardiac tamponade, course complicated by cardiac arrest, use of ECMO, seen as a consultation for the evaluation of AKI and fluid volume management.  #Acute kidney injury on CKD IIIa, nonoliguric: Ischemic ATN due to cardiogenic shock/cardiac arrest and multiple hemodynamic changes.  Started CRRT on 8/28 for decreasing urine output and fluid/volume management.  Overnight patient became unstable therefore CRRT is running even.  The pressors requirement has gone up.  All 4K bath and CRRT.  Attempt UF when he becomes more stable.  Discussed with ICU nurse.  Monitor lab.   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  The echo was decannulated  with placement of Impella.   #Cardiogenic shock/cardiac arrest: Was on ECMO before.  He is on multiple pressors including epinephrine, Levophed and vasopressin.  Followed closely by cardiology team.   #Acute hypoxic respiratory failure: Currently intubated and sedated.  Per pulmonary team.   #Acute blood loss anemia: Received blood transfusion.  Monitor hemoglobin.  Discussed with ICU nurses and cardiology.   Subjective: The patient was seen and examined in ICU.  Overnight event noted when patient required about a minute of lateral chest compression.  The pressors requirement has gone up with very soft blood pressure.  CRRT is currently running even.  Urine output is only recorded 790 cc.   objective Vital signs in last 24 hours: Vitals:   10/08/21 0633 10/08/21 0645 10/08/21 0700 10/08/21 0746  BP:      Pulse:  (!) 115 (!) 114 (!) 113  Resp:  20 20 20   Temp:  (!) 97.5 F (36.4 C) 97.7 F (36.5 C)   TempSrc:      SpO2: 100% 99% 100% 98%   Weight:      Height:       Weight change:   Intake/Output Summary (Last 24 hours) at 10/08/2021 1000 Last data filed at 10/08/2021 0800 Gross per 24 hour  Intake 9440.14 ml  Output 6801 ml  Net 2639.14 ml       Labs: RENAL PANEL Recent Labs    09/29/21 0359 09/29/21 0430 09/29/21 1154 09/29/21 1244 09/29/21 1956 09/29/21 2152 09/30/21 0835 09/30/21 0933 09/30/21 1650 09/30/21 1936 10/01/21 0430 10/01/21 0431 10/01/21 1616 10/01/21 1623 10/02/21 0354 10/02/21 0528 10/02/21 2124 10/03/21 0408 10/03/21 0414 10/03/21 0905 10/03/21 1942 10/03/21 2257 10/04/21 0256 10/04/21 0623 10/04/21 1600 10/04/21 2212 10/05/21 0441 10/05/21 0451 10/05/21 1857 10/05/21 2044 10/06/21 1600 10/06/21 1929 10/06/21 2155 10/07/21 0408 10/07/21 0417 10/07/21 0808 10/07/21 0811 10/07/21 0917 10/07/21 0923 10/07/21 0951 10/07/21 0955 10/07/21 1106 10/07/21 1109 10/07/21 1205 10/07/21 1208 10/07/21 1311 10/07/21 1316 10/07/21 1532 10/07/21 1730 10/07/21 1738 10/07/21 1743 10/07/21 1814 10/08/21 0349 10/08/21 0350 10/08/21 0559  NA 132*   < > 136   < > 139   < > 137   < > 139   < > 138   < > 141   < > 140   < > 142   < > 141   < > 144  144   < > 144   < > 144   < > 144   < > 144   < > 144   < > 145 148*   < > 147*   < >  146*   < > 148*   < > 147*   < > 146*   < > 144 145 146* 146* 145 145 145 141 139 141  K 4.5   < > 4.0   < > 4.3   < > 3.6   < > 4.0   < > 4.1   < > 4.3   < > 4.2   < > 3.7   < > 4.2   < > 4.2  4.2   < > 4.5   < > 4.3   < > 3.6   < > 3.6   < > 4.0   < > 4.0 3.8   < > 4.1   < > 3.8   < > 3.5   < > 3.7   < > 3.9   < > 4.9 4.9 4.2 4.2 4.6 4.6 4.6 4.7 4.6 4.7  CL 101  --  100   < > 102   < > 104   < > 106  --  109  --  108  --  109   < > 110  --  111   < > 111  --  110  --  110  --  111  --  114*   < > 114*  --  115* 115*  --  110  --  110  --  112*  --  111  --  110  --  112*  --   --  111  --  109  --   --  107  --   CO2 21*  --  21*   < > 26  --  23    < > 28  --  26  --  28  --  26   < > 24  --  26   < > 29  --  28  --  25  --  25  --  25  --  25  --  27 25  --   --   --   --   --   --   --   --   --   --   --   --   --   --  22  --   --   --   --  23  --   GLUCOSE 64*  --  121*   < > 238*   < > 139*   < > 126*  --  196*  --  183*  --  177*   < > 162*  --  149*   < > 161*  --  142*  --  145*  --  178*  --  142*   < > 139*  --  165* 135*  --  121*  --  131*  --  125*  --  128*  --  132*  --  203*  --   --  236*  --  274*  --   --  253*  --   BUN 52*  --  54*   < > 50*   < > 41*   < > 38*  --  39*  --  43*  --  47*   < > 51*  --  53*   < > 49*  --  50*  --  52*  --  60*  --  65*   < > 72*  --  76* 78*  --  76*  --  74*  --  66*  --  70*  --  74*  --  73*  --   --  75*  --  70*  --   --  51*  --   CREATININE 2.72*  --  3.28*   < > 2.92*   < > 2.48*   < > 2.44*  --  2.47*  --  2.62*  --  2.77*   < > 2.71*  --  2.78*   < > 2.44*  --  2.47*  --  2.88*  --  2.82*  --  3.01*   < > 3.09*  --  3.27* 3.40*  --  3.60*  --  3.50*  --  3.00*  --  3.20*  --  3.40*  --  3.40*  --   --  3.47*  --  3.60*  --   --  2.67*  --   CALCIUM 8.2*  --  8.6*   < > 7.7*  --  8.3*   < > 7.6*  --  7.2*  --  7.4*  --  7.4*   < > 7.1*  --  7.3*   < > 7.4*  --  7.2*  --  7.2*  --  7.3*  --  7.3*  --  7.9*  --  8.0* 8.2*  --   --   --   --   --   --   --   --   --   --   --   --   --   --  8.2*  --   --   --   --  7.8*  --   MG 2.4  --  2.2  --   --   --   --   --  1.7  --  2.6*  --  2.8*  --  2.7*  --  2.5*  --  2.6*  --   --   --  2.3  --   --   --   --   --   --   --   --   --   --   --   --   --   --   --   --   --   --   --   --   --   --   --   --   --   --   --   --   --   --  2.1  --   PHOS  --   --   --   --   --   --   --   --  4.6  --  4.5  --  4.3  --  4.2  --   --   --  3.6  --   --   --   --   --   --   --   --   --   --   --   --   --   --   --   --   --   --   --   --   --   --   --   --   --   --   --   --   --   --   --   --   --   --  4.8*  --   ALBUMIN  --   --   --    <  >  2.0*  --  2.9*  --   --   --  2.3*  --   --   --  2.0*  --   --   --  2.0*  --   --   --  2.0*  --   --   --  2.0*  --   --   --  1.9*  --   --  1.9*  --   --   --   --   --   --   --   --   --   --   --   --   --   --   --   --   --   --   --  2.6*  --    < > = values in this interval not displayed.     Liver Function Tests: Recent Labs  Lab 10/05/21 0441 10/06/21 1600 10/07/21 0408 10/08/21 0350  AST 28 99* 97*  --   ALT 11 10 15   --   ALKPHOS 37* 73 89  --   BILITOT 1.0 2.2* 1.1  --   PROT 3.7* 3.9* 4.1*  --   ALBUMIN 2.0* 1.9* 1.9* 2.6*   No results for input(s): "LIPASE", "AMYLASE" in the last 168 hours. No results for input(s): "AMMONIA" in the last 168 hours. CBC: Recent Labs    10/07/21 1530 10/07/21 1532 10/07/21 1835 10/08/21 0030 10/08/21 0349 10/08/21 0350 10/08/21 0559  HGB 10.6*   < > 7.8* 9.7* 8.8* 9.5* 8.8*  MCV 88.1  --   --   --   --  88.6  --    < > = values in this interval not displayed.    Cardiac Enzymes: No results for input(s): "CKTOTAL", "CKMB", "CKMBINDEX", "TROPONINI" in the last 168 hours. CBG: Recent Labs  Lab 10/07/21 1530 10/07/21 2034 10/08/21 0026 10/08/21 0348 10/08/21 0908  GLUCAP 233* 274* 256* 244* 241*    Iron Studies: No results for input(s): "IRON", "TIBC", "TRANSFERRIN", "FERRITIN" in the last 72 hours. Studies/Results: DG CHEST PORT 1 VIEW  Result Date: 10/08/2021 CLINICAL DATA:  Check endotracheal tube placement EXAM: PORTABLE CHEST 1 VIEW COMPARISON:  09/17/2021 FINDINGS: Endotracheal tube is noted in satisfactory position. Feeding catheter extends into the stomach. Left thoracostomy tubes and pericardial drain are seen and stable. Impella catheter as well as bilateral jugular central lines are noted and stable. Postsurgical changes are seen consistent with bypass grafting. No focal infiltrate or effusion is noted. IMPRESSION: Tubes and lines in satisfactory position. No acute abnormality noted. Electronically Signed    By: Inez Catalina M.D.   On: 10/08/2021 04:01   DG Chest Port 1 View  Result Date: 10/07/2021 CLINICAL DATA:  Heart failure. EXAM: PORTABLE CHEST 1 VIEW COMPARISON:  Same day. FINDINGS: Stable cardiomegaly. Status post coronary bypass graft. Right internal jugular catheter is noted with tip in expected position of the SVC. Feeding tube is seen entering stomach. Tracheostomy tube is in good position. Left-sided chest tube is noted without pneumothorax. Bibasilar atelectasis and effusions are noted. Bony thorax is unremarkable. There is been interval placement of Impella device from right subclavian approach. IMPRESSION: Interval placement of Impella device from right subclavian approach. Left-sided chest tube is noted without pneumothorax. Stable bibasilar atelectasis and effusions are noted. Electronically Signed   By: Marijo Conception M.D.   On: 10/07/2021 15:57   DG C-Arm 1-60 Min-No Report  Result Date: 10/07/2021 Fluoroscopy was utilized by the requesting physician.  No radiographic interpretation.   DG C-Arm 1-60 Min-No Report  Result Date: 10/07/2021 Fluoroscopy was utilized by the requesting physician.  No radiographic interpretation.   DG C-Arm 1-60 Min-No Report  Result Date: 10/07/2021 Fluoroscopy was utilized by the requesting physician.  No radiographic interpretation.   DG Chest Port 1 View  Result Date: 10/07/2021 CLINICAL DATA:  ECMO, ETT present EXAM: PORTABLE CHEST 1 VIEW COMPARISON:  10/06/2021 FINDINGS: No significant change in AP portable chest radiograph. Cardiomegaly. Support apparatus including endotracheal tube, left and right neck vascular catheters, left-sided chest and mediastinal drainage tubes, and arteriovenous ECMO cannula are unchanged. Probable small layering pleural effusions. IMPRESSION: 1. No significant change in AP portable chest radiograph. 2. Unchanged support apparatus as above. 3. Probable small layering pleural effusions. No new airspace opacity.  Electronically Signed   By: Delanna Ahmadi M.D.   On: 10/07/2021 08:18    Medications: Infusions:   prismasol BGK 4/2.5 400 mL/hr at 10/08/21 0607    prismasol BGK 4/2.5 400 mL/hr at 10/08/21 0606   albumin human     albumin human 60 mL/hr at 10/08/21 0700   amiodarone 60 mg/hr (10/08/21 0800)   epinephrine 3 mcg/min (10/08/21 0800)   HYDROmorphone 4 mg/hr (10/08/21 0800)   lactated ringers     lactated ringers 20 mL/hr at 10/08/21 0800   meropenem (MERREM) IV Stopped (10/08/21 0238)   midazolam 8 mg/hr (10/08/21 0800)   milrinone 0.375 mcg/kg/min (10/08/21 0800)   nitroGLYCERIN Stopped (10/07/21 1556)   norepinephrine (LEVOPHED) Adult infusion 32 mcg/min (10/08/21 0938)   prismasol BGK 4/2.5 1,500 mL/hr at 10/08/21 0706   sodium bicarbonate 25 mEq (Impella PURGE) in dextrose 5 % 1000 mL bag     sodium bicarbonate 75 mEq in sodium chloride 0.45 % 1,075 mL infusion 125 mL/hr at 10/08/21 0800   vancomycin Stopped (10/07/21 2141)   vasopressin 0.04 Units/min (10/08/21 0800)    Scheduled Medications:  acetaminophen  1,000 mg Oral Q6H   Or   acetaminophen (TYLENOL) oral liquid 160 mg/5 mL  1,000 mg Per Tube Q6H   acetaminophen (TYLENOL) oral liquid 160 mg/5 mL  650 mg Per Tube Once   Or   acetaminophen  650 mg Rectal Once   atorvastatin  80 mg Per Tube Daily   chlorhexidine  15 mL Mouth/Throat NOW   Chlorhexidine Gluconate Cloth  6 each Topical Daily   clonazePAM  1 mg Per Tube Q8H   colchicine  0.6 mg Per Tube Daily   insulin aspart  0-9 Units Subcutaneous Q4H   insulin detemir  30 Units Subcutaneous BID   metoCLOPramide (REGLAN) injection  10 mg Intravenous Q6H   mouth rinse  15 mL Mouth Rinse Q2H   pantoprazole (PROTONIX) IV  40 mg Intravenous QHS   polyethylene glycol  17 g Per Tube Daily   QUEtiapine  50 mg Per Tube BID   sodium chloride flush  10-40 mL Intracatheter Q12H   sodium chloride flush  3 mL Intravenous Q12H    have reviewed scheduled and prn  medications.  Physical Exam: General: Critically ill looking male, intubated, sedated, not responding and has multiple lines for infusion. Heart:RRR, s1s2 nl Lungs: Multiple chest tubes and lines present. Abdomen: Abdomen is wall is edematous, mild distention present. Extremities: Both upper and lower extremities has pitting edema and anasarca + Dialysis Access: Left groin temporary HD catheter was placed by PCCM on 8/28, site looks clean.  Miri Jose Prasad [place] 10/08/2021,10:00 AM  LOS: 10 days

## 2021-10-09 ENCOUNTER — Inpatient Hospital Stay (HOSPITAL_COMMUNITY): Payer: Self-pay

## 2021-10-09 DIAGNOSIS — I319 Disease of pericardium, unspecified: Secondary | ICD-10-CM

## 2021-10-09 LAB — BPAM RBC
Blood Product Expiration Date: 202309222359
Blood Product Expiration Date: 202309292359
Blood Product Expiration Date: 202309292359
Blood Product Expiration Date: 202309292359
Blood Product Expiration Date: 202309302359
Blood Product Expiration Date: 202309302359
Blood Product Expiration Date: 202310012359
Blood Product Expiration Date: 202310012359
Blood Product Expiration Date: 202310012359
Blood Product Expiration Date: 202310012359
Blood Product Expiration Date: 202310012359
Blood Product Expiration Date: 202310012359
ISSUE DATE / TIME: 202308271714
ISSUE DATE / TIME: 202308280126
ISSUE DATE / TIME: 202308280830
ISSUE DATE / TIME: 202308280830
ISSUE DATE / TIME: 202308280830
ISSUE DATE / TIME: 202308280830
ISSUE DATE / TIME: 202308280830
ISSUE DATE / TIME: 202308281924
ISSUE DATE / TIME: 202308281924
Unit Type and Rh: 5100
Unit Type and Rh: 5100
Unit Type and Rh: 5100
Unit Type and Rh: 5100
Unit Type and Rh: 5100
Unit Type and Rh: 5100
Unit Type and Rh: 5100
Unit Type and Rh: 5100
Unit Type and Rh: 5100
Unit Type and Rh: 5100
Unit Type and Rh: 5100
Unit Type and Rh: 5100

## 2021-10-09 LAB — TYPE AND SCREEN
ABO/RH(D): O POS
Antibody Screen: NEGATIVE
Unit division: 0
Unit division: 0
Unit division: 0
Unit division: 0
Unit division: 0
Unit division: 0
Unit division: 0
Unit division: 0
Unit division: 0
Unit division: 0
Unit division: 0
Unit division: 0

## 2021-10-09 LAB — BASIC METABOLIC PANEL
Anion gap: 9 (ref 5–15)
BUN: 33 mg/dL — ABNORMAL HIGH (ref 8–23)
CO2: 25 mmol/L (ref 22–32)
Calcium: 8.2 mg/dL — ABNORMAL LOW (ref 8.9–10.3)
Chloride: 103 mmol/L (ref 98–111)
Creatinine, Ser: 1.94 mg/dL — ABNORMAL HIGH (ref 0.61–1.24)
GFR, Estimated: 39 mL/min — ABNORMAL LOW (ref >60)
Glucose, Bld: 329 mg/dL — ABNORMAL HIGH (ref 70–99)
Potassium: 4.1 mmol/L (ref 3.5–5.1)
Sodium: 137 mmol/L (ref 135–145)

## 2021-10-09 LAB — RENAL FUNCTION PANEL
Albumin: 2.5 g/dL — ABNORMAL LOW (ref 3.5–5.0)
Albumin: 2.3 g/dL — ABNORMAL LOW (ref 3.5–5.0)
Anion gap: 14 (ref 5–15)
Anion gap: 8 (ref 5–15)
BUN: 35 mg/dL — ABNORMAL HIGH (ref 8–23)
BUN: 33 mg/dL — ABNORMAL HIGH (ref 8–23)
CO2: 25 mmol/L (ref 22–32)
CO2: 25 mmol/L (ref 22–32)
Calcium: 8.2 mg/dL — ABNORMAL LOW (ref 8.9–10.3)
Calcium: 7.8 mg/dL — ABNORMAL LOW (ref 8.9–10.3)
Chloride: 100 mmol/L (ref 98–111)
Chloride: 106 mmol/L (ref 98–111)
Creatinine, Ser: 2.01 mg/dL — ABNORMAL HIGH (ref 0.61–1.24)
Creatinine, Ser: 1.9 mg/dL — ABNORMAL HIGH (ref 0.61–1.24)
GFR, Estimated: 37 mL/min — ABNORMAL LOW (ref >60)
GFR, Estimated: 40 mL/min — ABNORMAL LOW (ref >60)
Glucose, Bld: 328 mg/dL — ABNORMAL HIGH (ref 70–99)
Glucose, Bld: 296 mg/dL — ABNORMAL HIGH (ref 70–99)
Phosphorus: 3 mg/dL (ref 2.5–4.6)
Phosphorus: 2.4 mg/dL — ABNORMAL LOW (ref 2.5–4.6)
Potassium: 4.1 mmol/L (ref 3.5–5.1)
Potassium: 3.9 mmol/L (ref 3.5–5.1)
Sodium: 139 mmol/L (ref 135–145)
Sodium: 139 mmol/L (ref 135–145)

## 2021-10-09 LAB — CBC
HCT: 23.9 % — ABNORMAL LOW (ref 39.0–52.0)
HCT: 22.5 % — ABNORMAL LOW (ref 39.0–52.0)
Hemoglobin: 7.9 g/dL — ABNORMAL LOW (ref 13.0–17.0)
Hemoglobin: 7.4 g/dL — ABNORMAL LOW (ref 13.0–17.0)
MCH: 29.7 pg (ref 26.0–34.0)
MCH: 30.3 pg (ref 26.0–34.0)
MCHC: 33.1 g/dL (ref 30.0–36.0)
MCHC: 32.9 g/dL (ref 30.0–36.0)
MCV: 89.8 fL (ref 80.0–100.0)
MCV: 92.2 fL (ref 80.0–100.0)
Platelets: 200 10*3/uL (ref 150–400)
Platelets: 179 10*3/uL (ref 150–400)
RBC: 2.66 MIL/uL — ABNORMAL LOW (ref 4.22–5.81)
RBC: 2.44 MIL/uL — ABNORMAL LOW (ref 4.22–5.81)
RDW: 16.8 % — ABNORMAL HIGH (ref 11.5–15.5)
RDW: 16.6 % — ABNORMAL HIGH (ref 11.5–15.5)
WBC: 16 10*3/uL — ABNORMAL HIGH (ref 4.0–10.5)
WBC: 14.4 10*3/uL — ABNORMAL HIGH (ref 4.0–10.5)
nRBC: 0 % (ref 0.0–0.2)
nRBC: 0 % (ref 0.0–0.2)

## 2021-10-09 LAB — POCT I-STAT EG7
Acid-Base Excess: 1 mmol/L (ref 0.0–2.0)
Bicarbonate: 26.7 mmol/L (ref 20.0–28.0)
Calcium, Ion: 1.11 mmol/L — ABNORMAL LOW (ref 1.15–1.40)
HCT: 20 % — ABNORMAL LOW (ref 39.0–52.0)
Hemoglobin: 6.8 g/dL — CL (ref 13.0–17.0)
O2 Saturation: 71 %
Patient temperature: 36.7
Potassium: 4 mmol/L (ref 3.5–5.1)
Sodium: 138 mmol/L (ref 135–145)
TCO2: 28 mmol/L (ref 22–32)
pCO2, Ven: 44.7 mmHg (ref 44–60)
pH, Ven: 7.383 (ref 7.25–7.43)
pO2, Ven: 38 mmHg (ref 32–45)

## 2021-10-09 LAB — POCT I-STAT 7, (LYTES, BLD GAS, ICA,H+H)
Acid-Base Excess: 2 mmol/L (ref 0.0–2.0)
Bicarbonate: 27.3 mmol/L (ref 20.0–28.0)
Calcium, Ion: 0.76 mmol/L — CL (ref 1.15–1.40)
HCT: 22 % — ABNORMAL LOW (ref 39.0–52.0)
Hemoglobin: 7.5 g/dL — ABNORMAL LOW (ref 13.0–17.0)
O2 Saturation: 100 %
Patient temperature: 36.9
Potassium: 5.5 mmol/L — ABNORMAL HIGH (ref 3.5–5.1)
Sodium: 137 mmol/L (ref 135–145)
TCO2: 29 mmol/L (ref 22–32)
pCO2 arterial: 45.2 mmHg (ref 32–48)
pH, Arterial: 7.389 (ref 7.35–7.45)
pO2, Arterial: 186 mmHg — ABNORMAL HIGH (ref 83–108)

## 2021-10-09 LAB — COOXEMETRY PANEL
Carboxyhemoglobin: 1.4 % (ref 0.5–1.5)
Carboxyhemoglobin: 0.9 % (ref 0.5–1.5)
Carboxyhemoglobin: 1.7 % — ABNORMAL HIGH (ref 0.5–1.5)
Carboxyhemoglobin: 1.9 % — ABNORMAL HIGH (ref 0.5–1.5)
Methemoglobin: <0.7 % (ref 0.0–1.5)
Methemoglobin: 1.2 % (ref 0.0–1.5)
Methemoglobin: <0.7 % (ref 0.0–1.5)
Methemoglobin: <0.7 % (ref 0.0–1.5)
O2 Saturation: 82.4 %
O2 Saturation: 96.2 %
O2 Saturation: 94.1 %
O2 Saturation: 80.2 %
Total hemoglobin: 11.4 g/dL — ABNORMAL LOW (ref 12.0–16.0)
Total hemoglobin: <6.9 g/dL — CL (ref 12.0–16.0)
Total hemoglobin: 7.3 g/dL — ABNORMAL LOW (ref 12.0–16.0)
Total hemoglobin: 7.9 g/dL — ABNORMAL LOW (ref 12.0–16.0)

## 2021-10-09 LAB — PROTIME-INR
INR: 1.6 — ABNORMAL HIGH (ref 0.8–1.2)
Prothrombin Time: 19.1 seconds — ABNORMAL HIGH (ref 11.4–15.2)

## 2021-10-09 LAB — GLUCOSE, CAPILLARY
Glucose-Capillary: 308 mg/dL — ABNORMAL HIGH (ref 70–99)
Glucose-Capillary: 317 mg/dL — ABNORMAL HIGH (ref 70–99)
Glucose-Capillary: 318 mg/dL — ABNORMAL HIGH (ref 70–99)
Glucose-Capillary: 184 mg/dL — ABNORMAL HIGH (ref 70–99)
Glucose-Capillary: 265 mg/dL — ABNORMAL HIGH (ref 70–99)
Glucose-Capillary: 263 mg/dL — ABNORMAL HIGH (ref 70–99)
Glucose-Capillary: 307 mg/dL — ABNORMAL HIGH (ref 70–99)
Glucose-Capillary: 321 mg/dL — ABNORMAL HIGH (ref 70–99)

## 2021-10-09 LAB — MAGNESIUM: Magnesium: 2.4 mg/dL (ref 1.7–2.4)

## 2021-10-09 LAB — LACTATE DEHYDROGENASE: LDH: 412 U/L — ABNORMAL HIGH (ref 98–192)

## 2021-10-09 MED ORDER — CALCIUM GLUCONATE-NACL 2-0.675 GM/100ML-% IV SOLN
2.0000 g | Freq: Once | INTRAVENOUS | Status: AC
  Administered 2021-10-09: 2000 mg via INTRAVENOUS
  Filled 2021-10-09: qty 100

## 2021-10-09 MED ORDER — INSULIN ASPART 100 UNIT/ML IJ SOLN
0.0000 [IU] | INTRAMUSCULAR | Status: DC
  Administered 2021-10-09 (×2): 8 [IU] via SUBCUTANEOUS
  Administered 2021-10-09: 3 [IU] via SUBCUTANEOUS
  Administered 2021-10-10: 5 [IU] via SUBCUTANEOUS
  Administered 2021-10-10: 8 [IU] via SUBCUTANEOUS
  Administered 2021-10-10: 3 [IU] via SUBCUTANEOUS
  Administered 2021-10-10: 5 [IU] via SUBCUTANEOUS
  Administered 2021-10-10: 8 [IU] via SUBCUTANEOUS
  Administered 2021-10-10: 5 [IU] via SUBCUTANEOUS
  Administered 2021-10-11 (×3): 3 [IU] via SUBCUTANEOUS
  Administered 2021-10-11: 5 [IU] via SUBCUTANEOUS
  Administered 2021-10-11 (×2): 3 [IU] via SUBCUTANEOUS
  Administered 2021-10-12 (×2): 5 [IU] via SUBCUTANEOUS
  Administered 2021-10-12: 2 [IU] via SUBCUTANEOUS
  Administered 2021-10-13 (×2): 5 [IU] via SUBCUTANEOUS
  Administered 2021-10-13: 8 [IU] via SUBCUTANEOUS
  Administered 2021-10-13: 5 [IU] via SUBCUTANEOUS
  Administered 2021-10-13: 8 [IU] via SUBCUTANEOUS
  Administered 2021-10-13 – 2021-10-14 (×3): 5 [IU] via SUBCUTANEOUS
  Administered 2021-10-14: 3 [IU] via SUBCUTANEOUS
  Administered 2021-10-14: 5 [IU] via SUBCUTANEOUS
  Administered 2021-10-14: 3 [IU] via SUBCUTANEOUS
  Administered 2021-10-14 (×2): 5 [IU] via SUBCUTANEOUS
  Administered 2021-10-15: 2 [IU] via SUBCUTANEOUS
  Administered 2021-10-15 (×3): 3 [IU] via SUBCUTANEOUS
  Administered 2021-10-16 – 2021-10-17 (×4): 2 [IU] via SUBCUTANEOUS
  Administered 2021-10-17: 3 [IU] via SUBCUTANEOUS
  Administered 2021-10-18 (×2): 2 [IU] via SUBCUTANEOUS
  Administered 2021-10-18: 3 [IU] via SUBCUTANEOUS
  Administered 2021-10-18: 2 [IU] via SUBCUTANEOUS
  Administered 2021-10-19: 5 [IU] via SUBCUTANEOUS
  Administered 2021-10-19 – 2021-10-20 (×2): 3 [IU] via SUBCUTANEOUS

## 2021-10-09 MED ORDER — INSULIN DETEMIR 100 UNIT/ML ~~LOC~~ SOLN
45.0000 [IU] | Freq: Two times a day (BID) | SUBCUTANEOUS | Status: DC
  Administered 2021-10-09 – 2021-10-13 (×9): 45 [IU] via SUBCUTANEOUS
  Filled 2021-10-09 (×13): qty 0.45

## 2021-10-09 NOTE — Progress Notes (Signed)
[facility] NEPHROLOGY PROGRESS NOTE  Assessment/ Plan: Pt is a 61 y.o. yo male  with history of HTN, HLD, DM, A-fib, stroke, CKD, CAD multivessel dz status post CABG on 8/3 presented with shortness of breath, found to have cardiac tamponade, course complicated by cardiac arrest, use of ECMO, seen as a consultation for the evaluation of AKI and fluid volume management.  #Acute kidney injury on CKD IIIa, nonoliguric: Ischemic ATN due to cardiogenic shock/cardiac arrest and multiple hemodynamic changes.  Started CRRT on 8/28 for decreasing urine output and fluid/volume management.   Tolerated UF around 100 cc an hour last night.  We will attempt UF as tolerated by BP.  The repeat lab this morning with potassium level 4.1.  Continue current CRRT prescription, all 4K bath.   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  The echo was decannulated  with placement of Impella.   #Cardiogenic shock/cardiac arrest: Was on ECMO before.  He is on multiple pressors including epinephrine, Levophed and vasopressin.  Followed closely by cardiology team.   #Acute hypoxic respiratory failure: Currently intubated and sedated.  Per pulmonary team.   #Acute blood loss anemia: Received blood transfusion.  Monitor hemoglobin.  Discussed with ICU nurses and cardiology.   Subjective: The patient was seen and examined in ICU.  Not much urine output.  Some UF around 100 cc an hour last night with CRRT.  Clinically remains critical.  Discussed with ICU nurses and the team.   objective Vital signs in last 24 hours: Vitals:   10/09/21 0655 10/09/21 0745 10/09/21 0800 10/09/21 0900  BP:  108/63    Pulse: (!) 111 (!) 106 (!) 109 84  Resp: 20 20 20 20   Temp: (!) 97.3 F (36.3 C) (!) 97.2 F (36.2 C) (!) 97.2 F (36.2 C) (!) 97.5 F (36.4 C)  TempSrc:   Bladder   SpO2: 100% 100% 100% 100%  Weight:      Height:       Weight  change: -7.7 kg  Intake/Output Summary (Last 24 hours) at 10/09/2021 1005 Last data filed at 10/09/2021 0900 Gross per 24 hour  Intake 4463.37 ml  Output 5842 ml  Net -1378.63 ml        Labs: RENAL PANEL Recent Labs    09/30/21 1650 09/30/21 1936 10/01/21 0430 10/01/21 0431 10/01/21 1616 10/01/21 1623 10/02/21 0354 10/02/21 0528 10/02/21 2124 10/03/21 0408 10/03/21 0414 10/03/21 0905 10/04/21 0256 10/04/21 0623 10/05/21 0441 10/05/21 0451 10/05/21 1857 10/05/21 2044 10/06/21 1600 10/06/21 1929 10/06/21 2155 10/07/21 0408 10/07/21 0417 10/07/21 0951 10/07/21 0955 10/07/21 1106 10/07/21 1109 10/07/21 1205 10/07/21 1208 10/07/21 1311 10/07/21 1316 10/07/21 1730 10/07/21 1738 10/07/21 1743 10/07/21 1814 10/08/21 0349 10/08/21 0350 10/08/21 0559 10/08/21 1513 10/08/21 1515 10/09/21 0410 10/09/21 0419 10/09/21 0808  NA 139   < > 138   < > 141   < > 140   < > 142   < > 141   < > 144   < > 144   < > 144   < > 144   < > 145 148*   < > 148*   < > 147*   < > 146*   < > 144   < > 146*   < > 145 145 141 139 141 139 141 139 137 137   K 4.0   < > 4.1   < > 4.3   < > 4.2   < > 3.7   < >  4.2   < > 4.5   < > 3.6   < > 3.6   < > 4.0   < > 4.0 3.8   < > 3.5   < > 3.7   < > 3.9   < > 4.9   < > 4.2   < > 4.6 4.6 4.7 4.6 4.7 4.5 4.6 4.1 5.5* 4.1  CL 106  --  109  --  108  --  109   < > 110  --  111   < > 110   < > 111  --  114*   < > 114*  --  115* 115*   < > 112*  --  111  --  110  --  112*  --  111  --  109  --   --  107  --   --  104 100  --  103  CO2 28  --  26  --  28  --  26   < > 24  --  26   < > 28   < > 25  --  25  --  25  --  27 25  --   --   --   --   --   --   --   --   --  22  --   --   --   --  23  --   --  25 25  --  25  GLUCOSE 126*  --  196*  --  183*  --  177*   < > 162*  --  149*   < > 142*   < > 178*  --  142*   < > 139*  --  165* 135*   < > 125*  --  128*  --  132*  --  203*  --  236*  --  274*  --   --  253*  --   --  295* 328*  --  329*  BUN 38*  --  39*   --  43*  --  47*   < > 51*  --  53*   < > 50*   < > 60*  --  65*   < > 72*  --  76* 78*   < > 66*  --  70*  --  74*  --  73*  --  75*  --  70*  --   --  51*  --   --  39* 35*  --  33*  CREATININE 2.44*  --  2.47*  --  2.62*  --  2.77*   < > 2.71*  --  2.78*   < > 2.47*   < > 2.82*  --  3.01*   < > 3.09*  --  3.27* 3.40*   < > 3.00*  --  3.20*  --  3.40*  --  3.40*  --  3.47*  --  3.60*  --   --  2.67*  --   --  2.34* 2.01*  --  1.94*  CALCIUM 7.6*  --  7.2*  --  7.4*  --  7.4*   < > 7.1*  --  7.3*   < > 7.2*   < > 7.3*  --  7.3*  --  7.9*  --  8.0* 8.2*  --   --   --   --   --   --   --   --   --  8.2*  --   --   --   --  7.8*  --   --  8.0* 8.2*  --  8.2*  MG 1.7  --  2.6*  --  2.8*  --  2.7*  --  2.5*  --  2.6*  --  2.3  --   --   --   --   --   --   --   --   --   --   --   --   --   --   --   --   --   --   --   --   --   --   --  2.1  --   --  2.2 2.4  --   --   PHOS 4.6  --  4.5  --  4.3  --  4.2  --   --   --  3.6  --   --   --   --   --   --   --   --   --   --   --   --   --   --   --   --   --   --   --   --   --   --   --   --   --  4.8*  --   --  3.8 3.0  --   --   ALBUMIN  --   --  2.3*  --   --   --  2.0*  --   --   --  2.0*  --  2.0*  --  2.0*  --   --   --  1.9*  --   --  1.9*  --   --   --   --   --   --   --   --   --   --   --   --   --   --  2.6*  --   --  2.5* 2.5*  --   --    < > = values in this interval not displayed.      Liver Function Tests: Recent Labs  Lab 10/05/21 0441 10/06/21 1600 10/07/21 0408 10/08/21 0350 10/08/21 1515 10/09/21 0410  AST 28 99* 97*  --   --   --   ALT 11 10 15   --   --   --   ALKPHOS 37* 73 89  --   --   --   BILITOT 1.0 2.2* 1.1  --   --   --   PROT 3.7* 3.9* 4.1*  --   --   --   ALBUMIN 2.0* 1.9* 1.9* 2.6* 2.5* 2.5*    No results for input(s): "LIPASE", "AMYLASE" in the last 168 hours. No results for input(s): "AMMONIA" in the last 168 hours. CBC: Recent Labs    10/08/21 0559 10/08/21 1513 10/08/21 1515 10/09/21 0410  10/09/21 0419  HGB 8.8* 8.2* 8.6* 7.9* 7.5*  MCV  --   --  89.3 89.8  --      Cardiac Enzymes: No results for input(s): "CKTOTAL", "CKMB", "CKMBINDEX", "TROPONINI" in the last 168 hours. CBG: Recent Labs  Lab 10/08/21 0908 10/08/21 1152 10/08/21 1510 10/09/21 0424 10/09/21 0754  GLUCAP 241* 275* 273* 308* 317*     Iron Studies: No results for input(s): "IRON", "TIBC", "TRANSFERRIN", "FERRITIN" in the last 72 hours. Studies/Results: DG CHEST  PORT 1 VIEW  Result Date: 10/09/2021 CLINICAL DATA:  Status post cardiac surgery EXAM: PORTABLE CHEST 1 VIEW COMPARISON:  Chest radiograph 10/08/2021 FINDINGS: The endotracheal tube tip is approximately 3.0 cm from the carina. An Impella device projects over the left ventricle. A right IJ vascular catheter terminates in the mid to lower SVC. A left IJ vascular catheter terminates in the region of the confluence of left brachiocephalic vein/upper SVC, stable. Left-sided chest tubes and a presumed mediastinal drain are stable. The enteric catheter tip is off the field of view. The cardiomediastinal silhouette is stable. Hazy opacities projecting over the lower lobes likely reflect layering pleural effusions, similar to the prior study. There is no new or worsening focal airspace disease. There is no pneumothorax. The bones are stable. IMPRESSION: 1. Support devices as above. 2. Layering bilateral pleural effusions, unchanged. No new or worsening focal airspace disease. No pneumothorax. Electronically Signed   By: Valetta Mole M.D.   On: 10/09/2021 08:10   DG CHEST PORT 1 VIEW  Result Date: 10/08/2021 CLINICAL DATA:  Check endotracheal tube placement EXAM: PORTABLE CHEST 1 VIEW COMPARISON:  09/17/2021 FINDINGS: Endotracheal tube is noted in satisfactory position. Feeding catheter extends into the stomach. Left thoracostomy tubes and pericardial drain are seen and stable. Impella catheter as well as bilateral jugular central lines are noted and stable.  Postsurgical changes are seen consistent with bypass grafting. No focal infiltrate or effusion is noted. IMPRESSION: Tubes and lines in satisfactory position. No acute abnormality noted. Electronically Signed   By: Inez Catalina M.D.   On: 10/08/2021 04:01   DG Chest Port 1 View  Result Date: 10/07/2021 CLINICAL DATA:  Heart failure. EXAM: PORTABLE CHEST 1 VIEW COMPARISON:  Same day. FINDINGS: Stable cardiomegaly. Status post coronary bypass graft. Right internal jugular catheter is noted with tip in expected position of the SVC. Feeding tube is seen entering stomach. Tracheostomy tube is in good position. Left-sided chest tube is noted without pneumothorax. Bibasilar atelectasis and effusions are noted. Bony thorax is unremarkable. There is been interval placement of Impella device from right subclavian approach. IMPRESSION: Interval placement of Impella device from right subclavian approach. Left-sided chest tube is noted without pneumothorax. Stable bibasilar atelectasis and effusions are noted. Electronically Signed   By: Marijo Conception M.D.   On: 10/07/2021 15:57   DG C-Arm 1-60 Min-No Report  Result Date: 10/07/2021 Fluoroscopy was utilized by the requesting physician.  No radiographic interpretation.   DG C-Arm 1-60 Min-No Report  Result Date: 10/07/2021 Fluoroscopy was utilized by the requesting physician.  No radiographic interpretation.   DG C-Arm 1-60 Min-No Report  Result Date: 10/07/2021 Fluoroscopy was utilized by the requesting physician.  No radiographic interpretation.    Medications: Infusions:   prismasol BGK 4/2.5 400 mL/hr at 10/09/21 0642    prismasol BGK 4/2.5 400 mL/hr at 10/09/21 4193   albumin human Stopped (10/08/21 1821)   amiodarone 60 mg/hr (10/09/21 0900)   epinephrine 4 mcg/min (10/09/21 0900)   feeding supplement (PIVOT 1.5 CAL) 1,000 mL (10/09/21 0933)   HYDROmorphone 5 mg/hr (10/09/21 0900)   lactated ringers     lactated ringers 10 mL/hr at 10/09/21  0900   meropenem (MERREM) IV Stopped (10/09/21 0245)   midazolam 8 mg/hr (10/09/21 0900)   nitroGLYCERIN Stopped (10/07/21 1556)   norepinephrine (LEVOPHED) Adult infusion 8 mcg/min (10/09/21 0900)   prismasol BGK 4/2.5 1,500 mL/hr at 10/09/21 0642   sodium bicarbonate 25 mEq (Impella PURGE) in dextrose 5 % 1000  mL bag     vancomycin Stopped (10/08/21 2216)   vasopressin 0.04 Units/min (10/09/21 0900)    Scheduled Medications:  acetaminophen  1,000 mg Oral Q6H   Or   acetaminophen (TYLENOL) oral liquid 160 mg/5 mL  1,000 mg Per Tube Q6H   acetaminophen (TYLENOL) oral liquid 160 mg/5 mL  650 mg Per Tube Once   Or   acetaminophen  650 mg Rectal Once   atorvastatin  80 mg Per Tube Daily   Chlorhexidine Gluconate Cloth  6 each Topical Daily   clonazePAM  1 mg Per Tube Q8H   colchicine  0.6 mg Per Tube Daily   feeding supplement (PROSource TF20)  60 mL Per Tube QID   insulin aspart  0-15 Units Subcutaneous Q4H   insulin detemir  45 Units Subcutaneous BID   metoCLOPramide (REGLAN) injection  10 mg Intravenous Q6H   mouth rinse  15 mL Mouth Rinse Q2H   pantoprazole (PROTONIX) IV  40 mg Intravenous QHS   polyethylene glycol  17 g Per Tube Daily   QUEtiapine  50 mg Per Tube BID   sodium chloride flush  10-40 mL Intracatheter Q12H   sodium chloride flush  3 mL Intravenous Q12H    have reviewed scheduled and prn medications.  Physical Exam: General: Critically ill looking male, intubated, sedated, not responding and has multiple lines for infusion. Heart:RRR, s1s2 nl Lungs: Multiple chest tubes and lines present. Abdomen: Abdomen is wall is edematous, mild distention present. Extremities: Both upper and lower extremities has pitting edema and anasarca + Dialysis Access: Left groin temporary HD catheter was placed by PCCM on 8/28, site looks clean.  Morganne Haile Prasad Pheonix [place] 10/09/2021,10:05 AM  LOS: 11 days

## 2021-10-09 NOTE — Progress Notes (Signed)
NAME:  RODNEY YERA, MRN:  [id number], DOB:  01/07/1961, LOS: 54 ADMISSION DATE:  09/28/2021, CONSULTATION DATE:  09/29/21 REFERRING MD:  Dr. Kipp Brood, CHIEF COMPLAINT:  Cardiac Arrest   History of Present Illness:  Carver Murakami is a 61 year old male with DMII, , CVA, atrial fibrillation on eliquis and multivessel CAD s/p 5 vessel CABG on 09/12/21 who presented 8/19 with shortness of breath via EMS. He was found to be bradycardic and hypoglycemic. He symptomatically improved with dextrose but continued to have bradycardia and soft blood pressures. ECHO obtained which showed LVEF 50%, grade II diastolic dysfunction and moderate pericardial effusion with possible tamponade physiology. CT Chest showed pericardial effusion and small bilateral pleural effusions. He was taken to the OR 8/19 for pericardial window.   Patient started having issues with tachy-brady arrhythmias on telemetry, decreased urine output and increased work of breathing over night. A 14 french pigtail catheter was placed in the left pleural space this morning with bloody drainage. At 10:35am patient became unresponsive with PEA arrest. He underwent multiple rounds of CPR with epinephrine pushes. ROSC was obtained and patient was intubated. Bedside US by cardiology showed large pericardial effusion with tamponade physiology. He lost pulse again and CPR was again performed. Please see code sheet for full details. Bedside sternotomy performed with pericardial drainage by CT surgery, immediate improvement in hemodynamics achieved with drainage of the pericardial space.   Pertinent  Medical History   Past Medical History:  Diagnosis Date   AKI (acute kidney injury) ([place])    pt unaware of this   Anginal pain ([place])    CAD (coronary artery disease)    a. 03/2015 NSTEMI: LHC with severe 3V CAD  (70% mid RCA, 95% OM1, 90% distal LCx, 90% OM3, 80% prox LAD and 90% ost D1) s/p DES to mLAD w/ small dissction Rx with DES, staged ost Ramus PCI/DES and  dLCx s/p PCI/DES    Chest pain 12/24/2020   Diabetes mellitus type 2 in obese [facility])    Diverticulosis    Dyspnea    Dyspnea on exertion 03/16/2015   Dyspnea on exertion   Family history of adverse reaction to anesthesia    patient father- pt states after anesthesia his father "developed dementia"   GERD (gastroesophageal reflux disease)    Hypercholesteremia    Hypertension associated with diabetes ([place]) 03/16/2015   hypertension   NSTEMI (non-ST elevated myocardial infarction) ([place]) 03/17/2015   Obesity    Stroke ([place]) 2022   pt states he had a "mini stroke" during cardiac catheterization   Tobacco abuse    Significant Hospital Events: Including procedures, antibiotic start and stop dates in addition to other pertinent events   8/19 admitted, s/p pericardial window 8/20 PEA cardiac arrest, left pigtail chest tube placement, PEA cardiac arrest due to tamponade, bedside sternotomy with pericardial drains placed. 8/21 return to OR for overnight bleeding.  8/22 minimal chest tube output.  Atrial fibrillation, controled with amiodarone.  8/22 TEE showed EF 35% at baseline, which improved significantly with decreasing ECMO flow.  8/23 tolerated diuresis  8/24 mediastinal washout, removal of hematoma 8/25 ongoing bleeding issues from posterior mediastinum 8/29 decannulated and off ECMO, 5.5 Impella placed, L femoral HD cath placed and started on CRRT 8/30 hypoxia and hypotension followed by brief arrest, gentle L lateral chest compressions performed and ROSC in roughly 1 minute.  Interim History / Subjective:  NAEON. Milrinone off this AM. Remains on impella P4, BP stable.  Objective  Blood pressure 108/63, pulse (!) 109, temperature (!) 97.2 F (36.2 C), temperature source Bladder, resp. rate 20, height 5\' 7"  (1.702 m), weight 105.1 kg, SpO2 100 %. CVP:  [9 mmHg-20 mmHg] 11 mmHg  Vent Mode: PRVC FiO2 (%):  [50 %] 50 % Set Rate:  [20 bmp] 20 bmp Vt Set:  [530 mL] 530 mL PEEP:   [8 cmH20] 8 cmH20 Pressure Support:  [10 cmH20] 10 cmH20 Plateau Pressure:  [22 cmH20-24 cmH20] 24 cmH20   Intake/Output Summary (Last 24 hours) at 10/09/2021 0903 Last data filed at 10/09/2021 0800 Gross per 24 hour  Intake 4553.61 ml  Output 5852 ml  Net -1298.39 ml    Filed Weights   10/05/21 0630 10/08/21 0500 10/09/21 0440  Weight: 108.4 kg 112.8 kg 105.1 kg   Examination: Constitutional: critically ill appearing man intubated, sedated HEENT: [place]/AT, eyes anicteric. Cardiovascular: Tachy, regular, sternal dressing pulsing, no significant bleeding around dressing. Mediastinal tubes in place. Respiratory: CTAB, synchronous with MV. Gastrointestinal:  BS x 4, soft, NT. Skin: warm, dry Neurologic: RASS -5, reactive pupils GU: foley draining yellow urine   Assessment & Plan:   Hemorraghic pericarditis w/ cardiac tamponade/obstructive shock causing PEA arrest s/p pericardial drain placement with subsequent cardiac stunning on VA ECMO - - weaned off VA ECMO, impella 5.5 placed 8/28  in OR with pericardial washout and ECMO decannulation S/P coronary artery disease s/p CABG x 5 8/3 Paroxysmal atrial fibrillation/a.fluter Acute RV failure with cardiogenic & distributive shock - Continue Impella per CHF team, currently on P4 - Vasopressors and inotropes per TCTS, Milrinone now off - AC per TCTS> held for now -Continue amiodarone - RN to clarify code status with TCTS (re chest compressions etc)  Acute hypoxemic and hypercapnic respiratory failure - LTVV, 4-8cc/kg IBW with goal Pplat<30 and DP<15. Increased PEEP to 8. - VAP prevention protocol - PAD protocol for sedation - SAT & SBT when appropriate; not stable enough yet  Acute metabolic encephalopathy due to sedation; has previously been able to follow commands but was agitated. - PAD protocol for sedation; wean as able with versed first.  Sepsis due to pericarditis/Dresslers - antibiotics- vanc & meropenem to continue given  open chest and multiple OR trips, lines, etc.  Acute Kidney 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 Hypervolemia Hypocalcemia - s/p repletion 2g Ca gluconate - Continue CRRT,  UF 50/hr goal today. - Follow BMP.  Acute blood loss anemia- ongoing issue with any AC challenge - balanced transfusions PRN. - TEG pending.  Hx of DMII. - Continue SSI and levemir, goal BG 140-180.  Intolerance to TF - TF's.  Best Practice (right click and "Reselect all SmartList Selections" daily)   Diet/type: TFs DVT prophylaxis: see discussion above GI prophylaxis: PPI Lines: Central line and yes and it is still needed Foley:  Yes, and it is still needed Code Status:  full code Last date of multidisciplinary goals of care discussion [Per primary]   CC time: 40 min.   Montey Hora, [facility] For pager details, please see AMION or use Epic chat  After 1900, please call [facility] for cross coverage needs 10/09/2021, 9:03 AM

## 2021-10-09 NOTE — Progress Notes (Addendum)
Patient ID: [name], male   DOB: [birth year], 61 y.o.   MRN: [id number]    Advanced Heart Failure Rounding Note   Subjective:    - 8/19 Pericardial window - 8/20 Cardiac arrest with tamponade -> Emergent bedside washout - 09/29/21 VA Cannulation - 09/30/21 Return to OR for mediastinal hemorrhage - 10/01/21 Developed AF -> amio - 10/02/21 TEE EF 25-30%  - 10/04/21 OR for washout. C/b continued bleeding overnight - 10/07/21 Placement of Impella 5.5 with washout, VA ECMO decannulation. Hypotensive with development of severe RV dysfunction after ECMO off and pressors titrated up. Multiple units of blood products. - 10/08/21 Brief PEA arrest. AFL with RVR >> S/p DCCV to SR, back in AFL shortly after   CO-OX 82%. Currently on Epi 5, Milrinone 0.375>>off this am, NE 40>>15, Vaso 0.04.  MAP 70s   CVP 12. On CRRT, pulling for negative 100/hr. On bicarb gtt.  Back in AFL overnight, converted to SR this am.  LDH 1,299>439>412  Hgb 9.5>8.6>7.9   Impella 5.5 P4 Flow 2.2 L/min   Echo 08/28: LV EF 55-60% with severe LVH, small ventricular cavity, RV appears to have near normal systolic function   Objective:     Vital Signs:   Temp:  [96.8 F (36 C)-99 F (37.2 C)] 97.3 F (36.3 C) (08/30 0655) Pulse Rate:  [92-128] 111 (08/30 0655) Resp:  [7-68] 20 (08/30 0655) BP: (101-137)/(49-96) 123/75 (08/29 1400) SpO2:  [94 %-100 %] 100 % (08/30 0655) Arterial Line BP: (84-164)/(49-79) 93/57 (08/30 0655) FiO2 (%):  [50 %] 50 % (08/30 0300) Weight:  [105.1 kg] 105.1 kg (08/30 0440) Last BM Date : 10/07/21  Weight change: Filed Weights   10/05/21 0630 10/08/21 0500 10/09/21 0440  Weight: 108.4 kg 112.8 kg 105.1 kg    Intake/Output:   Intake/Output Summary (Last 24 hours) at 10/09/2021 0818 Last data filed at 10/09/2021 0700 Gross per 24 hour  Intake 4652.9 ml  Output 5816 ml  Net -1163.1 ml     Physical Exam: General: Sedated on vent HEENT: + ETT Neck: supple. JVP 10-12.  Carotids 2+ bilat; no bruits.  Cor: PMI nondisplaced. Irregular rhythm, tachy. No rubs, gallops or murmurs. Open chest.  Lungs: coarse Abdomen: soft, nontender, nondistended. Extremities: no cyanosis, clubbing, rash, 2+ edema Neuro: Sedated    Telemetry: AFL 110s>> SR 80s while examining patient this am   Labs: Basic Metabolic Panel: Recent Labs  Lab 10/03/21 0414 10/03/21 0905 10/04/21 0256 10/04/21 0623 10/07/21 0408 10/07/21 0417 10/07/21 1730 10/07/21 1738 10/07/21 1743 10/07/21 1814 10/08/21 0350 10/08/21 0559 10/08/21 1513 10/08/21 1515 10/09/21 0410 10/09/21 0419  NA 141   < > 144   < > 148*   < > 146*   < > 145   < > 139 141 139 141 139 137  K 4.2   < > 4.5   < > 3.8   < > 4.2   < > 4.6   < > 4.6 4.7 4.5 4.6 4.1 5.5*  CL 111   < > 110   < > 115*   < > 111  --  109  --  107  --   --  104 100  --   CO2 26   < > 28   < > 25  --  22  --   --   --  23  --   --  25 25  --   GLUCOSE 149*   < > 142*   < >  135*   < > 236*  --  274*  --  253*  --   --  295* 328*  --   BUN 53*   < > 50*   < > 78*   < > 75*  --  70*  --  51*  --   --  39* 35*  --   CREATININE 2.78*   < > 2.47*   < > 3.40*   < > 3.47*  --  3.60*  --  2.67*  --   --  2.34* 2.01*  --   CALCIUM 7.3*   < > 7.2*   < > 8.2*  --  8.2*  --   --   --  7.8*  --   --  8.0* 8.2*  --   MG 2.6*  --  2.3  --   --   --   --   --   --   --  2.1  --   --  2.2 2.4  --   PHOS 3.6  --   --   --   --   --   --   --   --   --  4.8*  --   --  3.8 3.0  --    < > = values in this interval not displayed.    Liver Function Tests: Recent Labs  Lab 10/03/21 0414 10/04/21 0256 10/05/21 0441 10/06/21 1600 10/07/21 0408 10/08/21 0350 10/08/21 1515 10/09/21 0410  AST 46* 22 28 99* 97*  --   --   --   ALT 18 12 11 10 15   --   --   --   ALKPHOS 116 43 37* 73 89  --   --   --   BILITOT 1.1 1.1 1.0 2.2* 1.1  --   --   --   PROT 4.1* 3.7* 3.7* 3.9* 4.1*  --   --   --   ALBUMIN 2.0* 2.0* 2.0* 1.9* 1.9* 2.6* 2.5* 2.5*   No results  for input(s): "LIPASE", "AMYLASE" in the last 168 hours. No results for input(s): "AMMONIA" in the last 168 hours.  CBC: Recent Labs  Lab 10/07/21 0408 10/07/21 0417 10/07/21 1530 10/07/21 1532 10/08/21 0350 10/08/21 0559 10/08/21 1513 10/08/21 1515 10/09/21 0410 10/09/21 0419  WBC 10.2  --  24.0*  --  16.0*  --   --  16.0* 16.0*  --   HGB 9.1*   < > 10.6*   < > 9.5* 8.8* 8.2* 8.6* 7.9* 7.5*  HCT 27.8*   < > 31.7*   < > 28.1* 26.0* 24.0* 25.1* 23.9* 22.0*  MCV 89.1  --  88.1  --  88.6  --   --  89.3 89.8  --   PLT 71*  --  135*  --  153  --   --  176 200  --    < > = values in this interval not displayed.    Cardiac Enzymes: No results for input(s): "CKTOTAL", "CKMB", "CKMBINDEX", "TROPONINI" in the last 168 hours.  BNP: BNP (last 3 results) Recent Labs    12/24/20 0651 09/28/21 0405  BNP 412.0* 826.6*    ProBNP (last 3 results) No results for input(s): "PROBNP" in the last 8760 hours.    Other results:  Imaging: DG CHEST PORT 1 VIEW  Result Date: 10/09/2021 CLINICAL DATA:  Status post cardiac surgery EXAM: PORTABLE CHEST 1 VIEW COMPARISON:  Chest radiograph 10/08/2021 FINDINGS: The endotracheal tube  tip is approximately 3.0 cm from the carina. An Impella device projects over the left ventricle. A right IJ vascular catheter terminates in the mid to lower SVC. A left IJ vascular catheter terminates in the region of the confluence of left brachiocephalic vein/upper SVC, stable. Left-sided chest tubes and a presumed mediastinal drain are stable. The enteric catheter tip is off the field of view. The cardiomediastinal silhouette is stable. Hazy opacities projecting over the lower lobes likely reflect layering pleural effusions, similar to the prior study. There is no new or worsening focal airspace disease. There is no pneumothorax. The bones are stable. IMPRESSION: 1. Support devices as above. 2. Layering bilateral pleural effusions, unchanged. No new or worsening focal  airspace disease. No pneumothorax. Electronically Signed   By: Valetta Mole M.D.   On: 10/09/2021 08:10   DG CHEST PORT 1 VIEW  Result Date: 10/08/2021 CLINICAL DATA:  Check endotracheal tube placement EXAM: PORTABLE CHEST 1 VIEW COMPARISON:  09/17/2021 FINDINGS: Endotracheal tube is noted in satisfactory position. Feeding catheter extends into the stomach. Left thoracostomy tubes and pericardial drain are seen and stable. Impella catheter as well as bilateral jugular central lines are noted and stable. Postsurgical changes are seen consistent with bypass grafting. No focal infiltrate or effusion is noted. IMPRESSION: Tubes and lines in satisfactory position. No acute abnormality noted. Electronically Signed   By: Inez Catalina M.D.   On: 10/08/2021 04:01   DG Chest Port 1 View  Result Date: 10/07/2021 CLINICAL DATA:  Heart failure. EXAM: PORTABLE CHEST 1 VIEW COMPARISON:  Same day. FINDINGS: Stable cardiomegaly. Status post coronary bypass graft. Right internal jugular catheter is noted with tip in expected position of the SVC. Feeding tube is seen entering stomach. Tracheostomy tube is in good position. Left-sided chest tube is noted without pneumothorax. Bibasilar atelectasis and effusions are noted. Bony thorax is unremarkable. There is been interval placement of Impella device from right subclavian approach. IMPRESSION: Interval placement of Impella device from right subclavian approach. Left-sided chest tube is noted without pneumothorax. Stable bibasilar atelectasis and effusions are noted. Electronically Signed   By: Marijo Conception M.D.   On: 10/07/2021 15:57   DG C-Arm 1-60 Min-No Report  Result Date: 10/07/2021 Fluoroscopy was utilized by the requesting physician.  No radiographic interpretation.   DG C-Arm 1-60 Min-No Report  Result Date: 10/07/2021 Fluoroscopy was utilized by the requesting physician.  No radiographic interpretation.   DG C-Arm 1-60 Min-No Report  Result Date:  10/07/2021 Fluoroscopy was utilized by the requesting physician.  No radiographic interpretation.     Medications:     Scheduled Medications:  acetaminophen  1,000 mg Oral Q6H   Or   acetaminophen (TYLENOL) oral liquid 160 mg/5 mL  1,000 mg Per Tube Q6H   acetaminophen (TYLENOL) oral liquid 160 mg/5 mL  650 mg Per Tube Once   Or   acetaminophen  650 mg Rectal Once   atorvastatin  80 mg Per Tube Daily   Chlorhexidine Gluconate Cloth  6 each Topical Daily   clonazePAM  1 mg Per Tube Q8H   colchicine  0.6 mg Per Tube Daily   feeding supplement (PROSource TF20)  60 mL Per Tube QID   insulin aspart  0-9 Units Subcutaneous Q4H   insulin detemir  45 Units Subcutaneous BID   metoCLOPramide (REGLAN) injection  10 mg Intravenous Q6H   mouth rinse  15 mL Mouth Rinse Q2H   pantoprazole (PROTONIX) IV  40 mg Intravenous QHS   polyethylene  glycol  17 g Per Tube Daily   QUEtiapine  50 mg Per Tube BID   sodium chloride flush  10-40 mL Intracatheter Q12H   sodium chloride flush  3 mL Intravenous Q12H    Infusions:   prismasol BGK 4/2.5 400 mL/hr at 10/09/21 0642    prismasol BGK 4/2.5 400 mL/hr at 10/09/21 1324   albumin human Stopped (10/08/21 1821)   amiodarone 60 mg/hr (10/09/21 0816)   epinephrine 5 mcg/min (10/09/21 0700)   feeding supplement (PIVOT 1.5 CAL) 20 mL/hr at 10/09/21 0700   HYDROmorphone 4 mg/hr (10/09/21 0700)   lactated ringers     lactated ringers 10 mL/hr at 10/09/21 0700   meropenem (MERREM) IV Stopped (10/09/21 0245)   midazolam 7 mg/hr (10/09/21 0700)   nitroGLYCERIN Stopped (10/07/21 1556)   norepinephrine (LEVOPHED) Adult infusion 15 mcg/min (10/09/21 0700)   prismasol BGK 4/2.5 1,500 mL/hr at 10/09/21 4010   sodium bicarbonate 25 mEq (Impella PURGE) in dextrose 5 % 1000 mL bag     sodium bicarbonate 75 mEq in sodium chloride 0.45 % 1,075 mL infusion Stopped (10/08/21 0941)   vancomycin Stopped (10/08/21 2216)   vasopressin 0.04 Units/min (10/09/21 0806)     PRN Medications: albumin human, artificial tears, dextrose, heparin, HYDROmorphone, midazolam, midazolam, ondansetron (ZOFRAN) IV, ondansetron (ZOFRAN) IV, sodium chloride, sodium chloride flush, sodium chloride flush   Assessment/Plan:    1. Shock - mixed cardiogenic/hemorrhagic -> VA ECMO -> decannulated on 8/28 to Impella 5.5 - Echo 08/28: Underfilled LV, EF 55-60% with severe LVH and near normal RV systolic function.  - Impella remains at P4, Flow 2.  - On Epi 5, NE 40>15, Mil 0.375>off, vaso 0.04. CO-OX 82% this am. Recheck this afternoon.  - CVP 12. Continues on CRRT to manage volume, currently pulling for negative 100/hr. Discontinue bicarb gtt.  - Aim for volume removal today to work towards being able to close his chest  2. Cardiac arrest (PEA/bradycardic) - 8/19 in setting of tamponade - Brief PEA arrest again on 08/28  3. Cardiac tamponade with emergent bedside sternotomy  - Diffuse epicardial bleeding with post-op Dresslers - return to OR 8/21 and 8/24 for washouts.  - Washout 8/28 in OR. Multiple units of blood products in OR. Hgb 9.5>8.6>7.9 this am. Follow. CBC this afternoon. - Not currently on heparin d/t bleeding  4. Acute hypoxemic respiratory failure - Off ECMO.  - Vent support per CCM  5. AKI due to ATN - CVVH started 08/28. 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 Continues on colchicine.  7. 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 ASA held as above  8. DM2 - continue SSI + levemir - Eventual SGLT2i  9. PAF/AFL - Tolerates poorly.  - Back in AFL. S/p DCCV  to SR 08/29. Reverted back to AFL shortly after. Converted to SR this am - On amio gtt at 60/hr, continue  10. ID - On vancomycin/meropenem with open chest.   11. Neuro - Has followed commands with vent wean.  - Requiring high dose sedation.   12. FEN - TFs ongoing  Length of Stay:  Parker, LINDSAY N 10/09/2021, 8:18 AM  Advanced Heart Failure Team Pager [phone] (M-F; 7a - 4p)  Please contact [facility] for night-coverage after hours (4p -7a ) and weekends on [email or web address]   Agree with above  Chest open. Remains intubated and sedated on Impella 5.5.  Have been able to wean pressors/inotropes down some overnight. Now off milrinone. Maps 90-100. Pulling well on CRRT ~100/hr. Back in NSR on IV amio. Bleeding from tubes has stopped.   Impeall 5.5 on P-4 with flows about 2L no suction alarms. Waveforms ok. Bicab in purge  General:  Intubated/sedated HEENT: + cortrak Neck: supple. + RIJ line Carotids 2+ bilat; no bruits. No lymphadenopathy or thryomegaly appreciated. Cor: chest open. Regular L chest impella site ok  Lungs: clear Abdomen: soft, nontender, nondistended. No hepatosplenomegaly. No bruits or masses. Good bowel sounds. Extremities: no cyanosis, clubbing, rash, 3+ edema Neuro:intubated sedated  Remains critically ill but somewhat improved overnight with decreased pressor requirements. Would focus on fluid removal today with an eye to possibly getting him to chest closure on Friday.   VAD interrogated personally. Parameters stable.  CRITICAL CARE Performed by: Glori Bickers  Total critical care time: 45 minutes  Critical care time was exclusive of separately billable procedures and treating other patients.  Critical care was necessary to treat or prevent imminent or life-threatening deterioration.  Critical care was time spent personally by me (independent of midlevel providers or residents) on the following activities: development of treatment plan with patient and/or surrogate as well as nursing, discussions with consultants, evaluation of patient's response to treatment, examination of patient, obtaining history from patient or surrogate, ordering and performing treatments and interventions, ordering and review of laboratory studies,  ordering and review of radiographic studies, pulse oximetry and re-evaluation of patient's condition.  Glori Bickers, MD  11:45 AM

## 2021-10-09 NOTE — Progress Notes (Signed)
EVENING ROUNDS NOTE :     [address]       [place] [address]             [phone]                 2 Days Post-Op Procedure(s) (LRB): INSERTION OF RIGHT AXILLARY IMPELLA, DECANNULATION OF ECMO, AND MEDIASTINAL WASHOUT (N/A) TRANSESOPHAGEAL ECHOCARDIOGRAM (TEE)   Total Length of Stay:  LOS: 11 days  Events:   Back in sinus No events today    BP (!) 158/68   Pulse 69   Temp (!) 97.2 F (36.2 C)   Resp 20   Ht 5\' 7"  (1.702 m)   Wt 105.1 kg   SpO2 100%   BMI 36.29 kg/m   CVP:  [10 mmHg-20 mmHg] 11 mmHg  Vent Mode: PRVC FiO2 (%):  [50 %] 50 % Set Rate:  [20 bmp] 20 bmp Vt Set:  [530 mL] 530 mL PEEP:  [8 cmH20] 8 cmH20 Pressure Support:  [10 cmH20] 10 cmH20 Plateau Pressure:  [24 cmH20] 24 cmH20    prismasol BGK 4/2.5 400 mL/hr at 10/09/21 0642    prismasol BGK 4/2.5 400 mL/hr at 10/09/21 0642   albumin human Stopped (10/08/21 1821)   amiodarone 60 mg/hr (10/09/21 1700)   epinephrine 3 mcg/min (10/09/21 1700)   feeding supplement (PIVOT 1.5 CAL) 40 mL/hr at 10/09/21 1700   HYDROmorphone 4 mg/hr (10/09/21 1700)   lactated ringers     lactated ringers 10 mL/hr at 10/09/21 1700   meropenem (MERREM) IV 1 g (10/09/21 1739)   midazolam 8 mg/hr (10/09/21 1700)   nitroGLYCERIN Stopped (10/07/21 1556)   norepinephrine (LEVOPHED) Adult infusion 4 mcg/min (10/09/21 1700)   prismasol BGK 4/2.5 1,500 mL/hr at 10/09/21 1333   sodium bicarbonate 25 mEq (Impella PURGE) in dextrose 5 % 1000 mL bag     vancomycin Stopped (10/08/21 2216)   vasopressin 0.04 Units/min (10/09/21 1700)    I/O last 3 completed shifts: In: 9429.9 [I.V.:6230.9; KZSWF:093[place] Sine; NG/GT:690; IV ATFTDDUKG:2542] Out: 70623 [Urine:245; Drains:250; Chest Tube:650]      Latest Ref Rng & Units 10/09/2021    2:29 PM 10/09/2021    1:35 PM 10/09/2021    4:19 AM  CBC  WBC 4.0 - 10.5 K/uL  14.4    Hemoglobin 13.0 - 17.0 g/dL 6.8  7.4  7.5   Hematocrit 39.0 - 52.0 % 20.0  22.5  22.0    Platelets 150 - 400 K/uL  179         Latest Ref Rng & Units 10/09/2021    3:31 PM 10/09/2021    2:29 PM 10/09/2021    8:08 AM  BMP  Glucose 70 - 99 mg/dL 296   329   BUN 8 - 23 mg/dL 33   33   Creatinine 0.61 - 1.24 mg/dL 1.90   1.94   Sodium 135 - 145 mmol/L 139  138  137   Potassium 3.5 - 5.1 mmol/L 3.9  4.0  4.1   Chloride 98 - 111 mmol/L 106   103   CO2 22 - 32 mmol/L 25   25   Calcium 8.9 - 10.3 mg/dL 7.8   8.2     ABG    Component Value Date/Time   PHART 7.389 10/09/2021 0419   PCO2ART 45.2 10/09/2021 0419   PO2ART 186 (H) 10/09/2021 0419   HCO3 26.7 10/09/2021 1429   TCO2 28 10/09/2021 1429   ACIDBASEDEF 2.0 10/07/2021  [place] 80.2 10/09/2021 [place], MD 10/09/2021 5:41 PM

## 2021-10-09 NOTE — Progress Notes (Signed)
2 Days Post-Op Procedure(s) (LRB): INSERTION OF RIGHT AXILLARY IMPELLA, DECANNULATION OF ECMO, AND MEDIASTINAL WASHOUT (N/A) TRANSESOPHAGEAL ECHOCARDIOGRAM (TEE) Subjective:  Have been able to wean vasopressors and continue to remove volume with CRRT overnight since milrinone decreased to 0.125. Co-ox this am 82%, unchanged from yesterday.  Epi on 5, NE down from 38 to 15. Vaso on 0.04 with MAP 70-80's, CVP 13.  Objective: Vital signs in last 24 hours: Temp:  [96.8 F (36 C)-99 F (37.2 C)] 97.3 F (36.3 C) (08/30 0655) Pulse Rate:  [92-128] 111 (08/30 0655) Cardiac Rhythm: Atrial fibrillation (08/30 0400) Resp:  [7-68] 20 (08/30 0655) BP: (101-137)/(49-96) 123/75 (08/29 1400) SpO2:  [94 %-100 %] 100 % (08/30 0655) Arterial Line BP: (84-164)/(49-79) 93/57 (08/30 0655) FiO2 (%):  [50 %-70 %] 50 % (08/30 0300) Weight:  [105.1 kg] 105.1 kg (08/30 0440)  Hemodynamic parameters for last 24 hours: CVP:  [9 mmHg-20 mmHg] 11 mmHg  Intake/Output from previous day: 08/29 0701 - 08/30 0700 In: 4919.9 [I.V.:3441.2; NG/GT:660; IV Piggyback:599.9] Out: 6010 [Urine:120; Chest Tube:200] Intake/Output this shift: No intake/output data recorded.  General appearance: anasarca seems a little better Neurologic: remains sedated on vent. Heart: regular rate and rhythm Lungs: clear to auscultation bilaterally Abdomen: soft, bowel sounds present Extremities: extremities warm, marked edema Wound: chest dressing intact. Impella incision ok, no hematoma or drainage. Right groin cannula site dry.  Lab Results: Recent Labs    10/08/21 1515 10/09/21 0410 10/09/21 0419  WBC 16.0* 16.0*  --   HGB 8.6* 7.9* 7.5*  HCT 25.1* 23.9* 22.0*  PLT 176 200  --    BMET:  Recent Labs    10/08/21 1515 10/09/21 0410 10/09/21 0419  NA 141 139 137  K 4.6 4.1 5.5*  CL 104 100  --   CO2 25 25  --   GLUCOSE 295* 328*  --   BUN 39* 35*  --   CREATININE 2.34* 2.01*  --   CALCIUM 8.0* 8.2*  --      PT/INR:  Recent Labs    10/08/21 0350  LABPROT 18.7*  INR 1.6*   ABG    Component Value Date/Time   PHART 7.389 10/09/2021 0419   HCO3 27.3 10/09/2021 0419   TCO2 29 10/09/2021 0419   ACIDBASEDEF 2.0 10/07/2021 1814   O2SAT 100 10/09/2021 0419   CBG (last 3)  Recent Labs    10/08/21 1152 10/08/21 1510 10/09/21 0424  GLUCAP 275* 273* 308*   CXR: stable    Assessment/Plan: S/P Procedure(s) (LRB): INSERTION OF RIGHT AXILLARY IMPELLA, DECANNULATION OF ECMO, AND MEDIASTINAL WASHOUT (N/A) TRANSESOPHAGEAL ECHOCARDIOGRAM (TEE)  He remains stable overall on Impella at P4 and lower dose vasopressors to support BP. Co-ox is good. I think we can turn off milrinone to remove vasodilator effect.  Continue to remove volume with CRRT.  I would continue broad spectrum antibiotics with high risk of sepsis with open chest, intubated, multiple explorations, cannulas and lines.  Glucose remains high. Will increase Levemir to 45 bid.  Appreciate CCM and heart failure management of this complicated pt with multi-system organ failure.    LOS: 11 days    [name] 10/09/2021

## 2021-10-09 NOTE — Progress Notes (Signed)
[facility] Physician-Brief Progress Note Patient Name: KONNAR BEN DOB: [birth year] MRN: [id number]   Date of Service  10/09/2021  HPI/Events of Note  iCa 0.76  eICU Interventions  Ca gluconate 2 g ordered     Intervention Category Minor Interventions: Electrolytes abnormality - evaluation and management  Robecca Fulgham Rodman Pickle 10/09/2021, 4:47 AM

## 2021-10-09 NOTE — Progress Notes (Signed)
ANTICOAGULATION CONSULT NOTE - Initial Consult  Pharmacy Consult for IV heparin  Indication: atrial fibrillation  No Known Allergies  Patient Measurements: Height: 5\' 7"  (170.2 cm) Weight: 105.1 kg (231 lb 11.3 oz) IBW/kg (Calculated) : 66.1 Heparin Dosing Weight: ~ 92 kg  Vital Signs: Temp: 98.1 F (36.7 C) (08/30 1400) Temp Source: Bladder (08/30 1200) BP: 93/55 (08/30 1108) Pulse Rate: 79 (08/30 1400)  Labs: Recent Labs    10/06/21 1600 10/06/21 1929 10/07/21 0408 10/07/21 0417 10/07/21 1530 10/07/21 1532 10/08/21 0350 10/08/21 0559 10/08/21 1515 10/09/21 0410 10/09/21 0419 10/09/21 0808 10/09/21 1303 10/09/21 1335 10/09/21 1429  HGB 7.7*   < > 9.1*   < > 10.6*   < > 9.5*   < > 8.6* 7.9* 7.5*  --   --  7.4* 6.8*  HCT 22.7*   < > 27.8*   < > 31.7*   < > 28.1*   < > 25.1* 23.9* 22.0*  --   --  22.5* 20.0*  PLT 72*  --  71*  --  135*  --  153  --  176 200  --   --   --  179  --   APTT 44*  --  46*  --  124*  --   --   --   --   --   --   --   --   --   --   LABPROT  --    < > 17.2*  --  18.7*  --  18.7*  --   --   --   --   --  19.1*  --   --   INR  --    < > 1.4*  --  1.6*  --  1.6*  --   --   --   --   --  1.6*  --   --   HEPARINUNFRC <0.10*  --  0.13*  --   --   --   --   --   --   --   --   --   --   --   --   CREATININE 3.09*   < > 3.40*   < >  --    < > 2.67*  --  2.34* 2.01*  --  1.94*  --   --   --    < > = values in this interval not displayed.    Estimated Creatinine Clearance: 46.2 mL/min (A) (by C-G formula based on SCr of 1.94 mg/dL (H)).   Medical History: Past Medical History:  Diagnosis Date   AKI (acute kidney injury) ([place])    pt unaware of this   Anginal pain ([place])    CAD (coronary artery disease)    a. 03/2015 NSTEMI: LHC with severe 3V CAD  (70% mid RCA, 95% OM1, 90% distal LCx, 90% OM3, 80% prox LAD and 90% ost D1) s/p DES to mLAD w/ small dissction Rx with DES, staged ost Ramus PCI/DES and dLCx s/p PCI/DES    Chest pain 12/24/2020    Diabetes mellitus type 2 in obese [facility])    Diverticulosis    Dyspnea    Dyspnea on exertion 03/16/2015   Dyspnea on exertion   Family history of adverse reaction to anesthesia    patient father- pt states after anesthesia his father "developed dementia"   GERD (gastroesophageal reflux disease)    Hypercholesteremia    Hypertension associated with diabetes ([place]) 03/16/2015   hypertension  NSTEMI (non-ST elevated myocardial infarction) ([place]) 03/17/2015   Obesity    Stroke ([place]) 2022   pt states he had a "mini stroke" during cardiac catheterization   Tobacco abuse     Medications:  Infusions:    prismasol BGK 4/2.5 400 mL/hr at 10/09/21 0642    prismasol BGK 4/2.5 400 mL/hr at 10/09/21 5686   albumin human Stopped (10/08/21 1821)   amiodarone 60 mg/hr (10/09/21 1426)   epinephrine 4 mcg/min (10/09/21 1400)   feeding supplement (PIVOT 1.5 CAL) 1,000 mL (10/09/21 1430)   HYDROmorphone 4 mg/hr (10/09/21 1400)   lactated ringers     lactated ringers 10 mL/hr at 10/09/21 1400   meropenem (MERREM) IV Stopped (10/09/21 1101)   midazolam 8 mg/hr (10/09/21 1400)   nitroGLYCERIN Stopped (10/07/21 1556)   norepinephrine (LEVOPHED) Adult infusion 10 mcg/min (10/09/21 1424)   prismasol BGK 4/2.5 1,500 mL/hr at 10/09/21 1333   sodium bicarbonate 25 mEq (Impella PURGE) in dextrose 5 % 1000 mL bag     vancomycin Stopped (10/08/21 2216)   vasopressin 0.04 Units/min (10/09/21 1400)    Assessment: 61 year old male with history of CABG 8/3, admitted for VF arrest 8/20 and patient found to have large pericardial clot/effusion. A pericardial window was later performed ultimately required ECMO cannulation. Patient has received numerous blood/factor products due to bleeding. Decannulated from ECMO 8/28.   Heparin trialed previously this admission while on ECMO with bleeding requiring the cessation of heparin.  Bleeding appears resolved, CBC fairly stable.  Discussed with Dr. Prescott Gum, will reattempt  heparin initiation today with low dose infusion.  Goal of Therapy:  Not titrating heparin for now Monitor platelets by anticoagulation protocol: Yes   Plan:  Start IV heparin without bolus at 400 units/hr. Check daily heparin levels (expect will be undetectable) Daily CBC. Monitor closely for bleeding.  [place] Crane, Roylene Reason, BCCP Clinical Pharmacist  10/09/2021 2:50 PM   [facility] pharmacy phone numbers are listed on [email or web address]  Addendum - after discussion with HF team - will wait one more day to initiate to allow adequate diuresis.  [place] Crane, Roylene Reason, BCCP Clinical Pharmacist  10/09/2021 2:53 PM   [facility] pharmacy phone numbers are listed on [email or web address]

## 2021-10-10 ENCOUNTER — Inpatient Hospital Stay (HOSPITAL_COMMUNITY): Payer: Self-pay

## 2021-10-10 LAB — RENAL FUNCTION PANEL
Albumin: 2.3 g/dL — ABNORMAL LOW (ref 3.5–5.0)
Albumin: 2.5 g/dL — ABNORMAL LOW (ref 3.5–5.0)
Anion gap: 6 (ref 5–15)
Anion gap: 7 (ref 5–15)
BUN: 33 mg/dL — ABNORMAL HIGH (ref 8–23)
BUN: 35 mg/dL — ABNORMAL HIGH (ref 8–23)
CO2: 26 mmol/L (ref 22–32)
CO2: 25 mmol/L (ref 22–32)
Calcium: 7.9 mg/dL — ABNORMAL LOW (ref 8.9–10.3)
Calcium: 7.8 mg/dL — ABNORMAL LOW (ref 8.9–10.3)
Chloride: 105 mmol/L (ref 98–111)
Chloride: 107 mmol/L (ref 98–111)
Creatinine, Ser: 1.91 mg/dL — ABNORMAL HIGH (ref 0.61–1.24)
Creatinine, Ser: 1.77 mg/dL — ABNORMAL HIGH (ref 0.61–1.24)
GFR, Estimated: 39 mL/min — ABNORMAL LOW (ref >60)
GFR, Estimated: 43 mL/min — ABNORMAL LOW (ref >60)
Glucose, Bld: 281 mg/dL — ABNORMAL HIGH (ref 70–99)
Glucose, Bld: 271 mg/dL — ABNORMAL HIGH (ref 70–99)
Phosphorus: 2.3 mg/dL — ABNORMAL LOW (ref 2.5–4.6)
Phosphorus: 2.1 mg/dL — ABNORMAL LOW (ref 2.5–4.6)
Potassium: 3.7 mmol/L (ref 3.5–5.1)
Potassium: 4 mmol/L (ref 3.5–5.1)
Sodium: 137 mmol/L (ref 135–145)
Sodium: 139 mmol/L (ref 135–145)

## 2021-10-10 LAB — COOXEMETRY PANEL
Carboxyhemoglobin: 1.8 % — ABNORMAL HIGH (ref 0.5–1.5)
Methemoglobin: <0.7 % (ref 0.0–1.5)
O2 Saturation: 73.7 %
Total hemoglobin: 7.1 g/dL — ABNORMAL LOW (ref 12.0–16.0)

## 2021-10-10 LAB — GLUCOSE, CAPILLARY
Glucose-Capillary: 195 mg/dL — ABNORMAL HIGH (ref 70–99)
Glucose-Capillary: 269 mg/dL — ABNORMAL HIGH (ref 70–99)
Glucose-Capillary: 216 mg/dL — ABNORMAL HIGH (ref 70–99)
Glucose-Capillary: 203 mg/dL — ABNORMAL HIGH (ref 70–99)
Glucose-Capillary: 259 mg/dL — ABNORMAL HIGH (ref 70–99)
Glucose-Capillary: 236 mg/dL — ABNORMAL HIGH (ref 70–99)
Glucose-Capillary: 237 mg/dL — ABNORMAL HIGH (ref 70–99)

## 2021-10-10 LAB — CBC
HCT: 22.5 % — ABNORMAL LOW (ref 39.0–52.0)
Hemoglobin: 7.2 g/dL — ABNORMAL LOW (ref 13.0–17.0)
MCH: 30 pg (ref 26.0–34.0)
MCHC: 32 g/dL (ref 30.0–36.0)
MCV: 93.8 fL (ref 80.0–100.0)
Platelets: 187 10*3/uL (ref 150–400)
RBC: 2.4 MIL/uL — ABNORMAL LOW (ref 4.22–5.81)
RDW: 16.9 % — ABNORMAL HIGH (ref 11.5–15.5)
WBC: 14.3 10*3/uL — ABNORMAL HIGH (ref 4.0–10.5)
nRBC: 0.2 % (ref 0.0–0.2)

## 2021-10-10 LAB — LACTATE DEHYDROGENASE: LDH: 353 U/L — ABNORMAL HIGH (ref 98–192)

## 2021-10-10 LAB — MAGNESIUM: Magnesium: 2.4 mg/dL (ref 1.7–2.4)

## 2021-10-10 LAB — PREPARE RBC (CROSSMATCH)

## 2021-10-10 LAB — PROTIME-INR
INR: 1.2 (ref 0.8–1.2)
Prothrombin Time: 15.4 seconds — ABNORMAL HIGH (ref 11.4–15.2)

## 2021-10-10 MED ORDER — SODIUM CHLORIDE 0.9 % IV SOLN: INTRAVENOUS | Status: DC | PRN

## 2021-10-10 MED ORDER — INSULIN ASPART 100 UNIT/ML IJ SOLN
8.0000 [IU] | INTRAMUSCULAR | Status: DC
  Administered 2021-10-10 – 2021-10-11 (×7): 8 [IU] via SUBCUTANEOUS

## 2021-10-10 MED ORDER — PIVOT 1.5 CAL PO LIQD
1000.0000 mL | ORAL | Status: DC
Start: 2021-10-10 — End: 2021-10-16
  Administered 2021-10-10 – 2021-10-15 (×6): 1000 mL

## 2021-10-10 MED ORDER — SODIUM CHLORIDE 0.9% IV SOLUTION: Freq: Once | INTRAVENOUS | Status: AC

## 2021-10-10 MED ORDER — IPRATROPIUM-ALBUTEROL 0.5-2.5 (3) MG/3ML IN SOLN
3.0000 mL | Freq: Once | RESPIRATORY_TRACT | Status: AC
  Administered 2021-10-10: 3 mL via RESPIRATORY_TRACT
  Filled 2021-10-10: qty 3

## 2021-10-10 NOTE — Progress Notes (Signed)
[facility] PROGRESS NOTE  Assessment/ Plan: Pt is a 61 y.o. yo male  with history of HTN, HLD, DM, A-fib, stroke, CKD, CAD multivessel dz status post CABG on 8/3 presented with shortness of breath, found to have cardiac tamponade, course complicated by cardiac arrest, use of ECMO, seen as a consultation for the evaluation of AKI and fluid volume management.  #Acute kidney injury on CKD IIIa, nonoliguric: Ischemic ATN due to cardiogenic shock/cardiac arrest and multiple hemodynamic changes.  Started CRRT on 8/28 for decreasing urine output and fluid/volume management.   Continue CRRT, all 4K bath, UF 100-150 cc an hour as tolerated by BP.  He is still looks grossly fluid overload/anasarca.   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  The echo was decannulated  with placement of Impella.   #Cardiogenic shock/cardiac arrest: Was on ECMO before.  He is on multiple pressors including epinephrine, Levophed and vasopressin.  Followed closely by cardiology team.   #Acute hypoxic respiratory failure: Currently intubated and sedated.  Per pulmonary team.   #Acute blood loss anemia: Received blood transfusion.  Monitor hemoglobin and transfuse as needed by ICU team.  Discussed with ICU nurses and cardiology.   Subjective: The patient was seen and examined in ICU.  Able to take off some fluid last night around 100 cc an hour.  So far tolerating well.  Still on pressors and on sedation.  Urine output is recorded only 87 cc.   objective Vital signs in last 24 hours: Vitals:   10/10/21 0845 10/10/21 0850 10/10/21 0900 10/10/21 0915  BP:      Pulse: 66 66 65 65  Resp: (!) 21 20 20 20   Temp: (!) 96.8 F (36 C) (!) 96.6 F (35.9 C) (!) 96.6 F (35.9 C) (!) 96.6 F (35.9 C)  TempSrc:  Bladder    SpO2: 100% 100% 100% 100%  Weight:      Height:       Weight change: 2.9 kg  Intake/Output Summary  (Last 24 hours) at 10/10/2021 0931 Last data filed at 10/10/2021 0929 Gross per 24 hour  Intake 4412.32 ml  Output 8327 ml  Net -3914.68 ml        Labs: RENAL PANEL Recent Labs    09/30/21 1650 09/30/21 1936 10/01/21 0430 10/01/21 0431 10/01/21 1616 10/01/21 1623 10/02/21 0354 10/02/21 0528 10/02/21 2124 10/03/21 0408 10/03/21 0414 10/03/21 0905 10/04/21 0256 10/04/21 0623 10/05/21 0441 10/05/21 0451 10/06/21 1600 10/06/21 1929 10/06/21 2155 10/07/21 0408 10/07/21 0417 10/07/21 1205 10/07/21 1208 10/07/21 1311 10/07/21 1316 10/07/21 1730 10/07/21 1738 10/07/21 1743 10/07/21 1814 10/08/21 0350 10/08/21 0559 10/08/21 1513 10/08/21 1515 10/09/21 0410 10/09/21 0419 10/09/21 0808 10/09/21 1429 10/09/21 1531 10/10/21 0344  NA 139   < > 138   < > 141   < > 140   < > 142   < > 141   < > 144   < > 144   < > 144   < > 145 148*   < > 146*   < > 144   < > 146*   < > 145   < > 139 141 139 141 139 137 137 138 139 137   K 4.0   < > 4.1   < > 4.3   < > 4.2   < > 3.7   < > 4.2   < > 4.5   < > 3.6   < > 4.0   < >  4.0 3.8   < > 3.9   < > 4.9   < > 4.2   < > 4.6   < > 4.6 4.7 4.5 4.6 4.1 5.5* 4.1 4.0 3.9 3.7  CL 106  --  109  --  108  --  109   < > 110  --  111   < > 110   < > 111   < > 114*  --  115* 115*   < > 110  --  112*  --  111  --  109  --  107  --   --  104 100  --  103  --  106 105  CO2 28  --  26  --  28  --  26   < > 24  --  26   < > 28   < > 25   < > 25  --  27 25  --   --   --   --   --  22  --   --   --  23  --   --  25 25  --  25  --  25 26  GLUCOSE 126*  --  196*  --  183*  --  177*   < > 162*  --  149*   < > 142*   < > 178*   < > 139*  --  165* 135*   < > 132*  --  203*  --  236*  --  274*  --  253*  --   --  295* 328*  --  329*  --  296* 281*  BUN 38*  --  39*  --  43*  --  47*   < > 51*  --  53*   < > 50*   < > 60*   < > 72*  --  76* 78*   < > 74*  --  73*  --  75*  --  70*  --  51*  --   --  39* 35*  --  33*  --  33* 33*  CREATININE 2.44*  --  2.47*  --   2.62*  --  2.77*   < > 2.71*  --  2.78*   < > 2.47*   < > 2.82*   < > 3.09*  --  3.27* 3.40*   < > 3.40*  --  3.40*  --  3.47*  --  3.60*  --  2.67*  --   --  2.34* 2.01*  --  1.94*  --  1.90* 1.91*  CALCIUM 7.6*  --  7.2*  --  7.4*  --  7.4*   < > 7.1*  --  7.3*   < > 7.2*   < > 7.3*   < > 7.9*  --  8.0* 8.2*  --   --   --   --   --  8.2*  --   --   --  7.8*  --   --  8.0* 8.2*  --  8.2*  --  7.8* 7.9*  MG 1.7  --  2.6*  --  2.8*  --  2.7*  --  2.5*  --  2.6*  --  2.3  --   --   --   --   --   --   --   --   --   --   --   --   --   --   --   --  2.1  --   --  2.2 2.4  --   --   --   --  2.4  PHOS 4.6  --  4.5  --  4.3  --  4.2  --   --   --  3.6  --   --   --   --   --   --   --   --   --   --   --   --   --   --   --   --   --   --  4.8*  --   --  3.8 3.0  --   --   --  2.4* 2.3*  ALBUMIN  --   --  2.3*  --   --   --  2.0*  --   --   --  2.0*  --  2.0*  --  2.0*  --  1.9*  --   --  1.9*  --   --   --   --   --   --   --   --   --  2.6*  --   --  2.5* 2.5*  --   --   --  2.3* 2.3*   < > = values in this interval not displayed.      Liver Function Tests: Recent Labs  Lab 10/05/21 0441 10/06/21 1600 10/07/21 0408 10/08/21 0350 10/09/21 0410 10/09/21 1531 10/10/21 0344  AST 28 99* 97*  --   --   --   --   ALT 11 10 15   --   --   --   --   ALKPHOS 37* 73 89  --   --   --   --   BILITOT 1.0 2.2* 1.1  --   --   --   --   PROT 3.7* 3.9* 4.1*  --   --   --   --   ALBUMIN 2.0* 1.9* 1.9*   < > 2.5* 2.3* 2.3*   < > = values in this interval not displayed.    No results for input(s): "LIPASE", "AMYLASE" in the last 168 hours. No results for input(s): "AMMONIA" in the last 168 hours. CBC: Recent Labs    10/09/21 0410 10/09/21 0419 10/09/21 1335 10/09/21 1429 10/10/21 0344  HGB 7.9* 7.5* 7.4* 6.8* 7.2*  MCV 89.8  --  92.2  --  93.8     Cardiac Enzymes: No results for input(s): "CKTOTAL", "CKMB", "CKMBINDEX", "TROPONINI" in the last 168 hours. CBG: Recent Labs  Lab 10/09/21 1533  10/09/21 1954 10/10/21 0012 10/10/21 0350 10/10/21 0812  GLUCAP 265* 263* 195* 269* 216*     Iron Studies: No results for input(s): "IRON", "TIBC", "TRANSFERRIN", "FERRITIN" in the last 72 hours. Studies/Results: DG CHEST PORT 1 VIEW  Result Date: 10/10/2021 CLINICAL DATA:  [id number]; ETT, chest tube present, status post Impella follow-up study EXAM: PORTABLE CHEST 1 VIEW COMPARISON:  Chest x-ray from yesterday FINDINGS: Again seen are the endotracheal tube, feeding tube, bilateral transjugular catheters, Impella catheter, left-sided chest tubes and presumed mediastinal drainage tube are stable. The tip of the enteric tube is off the field of view. Cardiopericardial silhouette is mildly enlarged, stable. There is some opacity seen at the left lung base, likely atelectasis, stable. Minor atelectasis at the right lung base, stable as well. The visualized skeletal structures are unremarkable. IMPRESSION: 1.  Life supporting apparatus are stable. 2. Atelectasis at the left lung base  without significant interval change. Electronically Signed   By: Frazier Richards M.D.   On: 10/10/2021 08:12   DG CHEST PORT 1 VIEW  Result Date: 10/09/2021 CLINICAL DATA:  Status post cardiac surgery EXAM: PORTABLE CHEST 1 VIEW COMPARISON:  Chest radiograph 10/08/2021 FINDINGS: The endotracheal tube tip is approximately 3.0 cm from the carina. An Impella device projects over the left ventricle. A right IJ vascular catheter terminates in the mid to lower SVC. A left IJ vascular catheter terminates in the region of the confluence of left brachiocephalic vein/upper SVC, stable. Left-sided chest tubes and a presumed mediastinal drain are stable. The enteric catheter tip is off the field of view. The cardiomediastinal silhouette is stable. Hazy opacities projecting over the lower lobes likely reflect layering pleural effusions, similar to the prior study. There is no new or worsening focal airspace disease. There is no pneumothorax.  The bones are stable. IMPRESSION: 1. Support devices as above. 2. Layering bilateral pleural effusions, unchanged. No new or worsening focal airspace disease. No pneumothorax. Electronically Signed   By: Valetta Mole M.D.   On: 10/09/2021 08:10    Medications: Infusions:   prismasol BGK 4/2.5 400 mL/hr at 10/10/21 0825    prismasol BGK 4/2.5 400 mL/hr at 10/10/21 0825   albumin human Stopped (10/08/21 1821)   amiodarone 60 mg/hr (10/10/21 0900)   epinephrine 3 mcg/min (10/10/21 0900)   feeding supplement (PIVOT 1.5 CAL) 60 mL/hr at 10/10/21 0900   HYDROmorphone 4 mg/hr (10/10/21 0900)   lactated ringers     lactated ringers 10 mL/hr at 10/10/21 0900   meropenem (MERREM) IV Stopped (10/10/21 0221)   midazolam 7 mg/hr (10/10/21 0900)   nitroGLYCERIN Stopped (10/07/21 1556)   norepinephrine (LEVOPHED) Adult infusion Stopped (10/10/21 0423)   prismasol BGK 4/2.5 1,500 mL/hr at 10/10/21 0530   sodium bicarbonate 25 mEq (Impella PURGE) in dextrose 5 % 1000 mL bag     vancomycin Stopped (10/09/21 2110)   vasopressin 0.01 Units/min (10/10/21 0900)    Scheduled Medications:  acetaminophen  1,000 mg Oral Q6H   Or   acetaminophen (TYLENOL) oral liquid 160 mg/5 mL  1,000 mg Per Tube Q6H   acetaminophen (TYLENOL) oral liquid 160 mg/5 mL  650 mg Per Tube Once   Or   acetaminophen  650 mg Rectal Once   atorvastatin  80 mg Per Tube Daily   Chlorhexidine Gluconate Cloth  6 each Topical Daily   clonazePAM  1 mg Per Tube Q8H   colchicine  0.6 mg Per Tube Daily   feeding supplement (PROSource TF20)  60 mL Per Tube QID   insulin aspart  0-15 Units Subcutaneous Q4H   insulin aspart  8 Units Subcutaneous Q4H   insulin detemir  45 Units Subcutaneous BID   metoCLOPramide (REGLAN) injection  10 mg Intravenous Q6H   mouth rinse  15 mL Mouth Rinse Q2H   pantoprazole (PROTONIX) IV  40 mg Intravenous QHS   polyethylene glycol  17 g Per Tube Daily   QUEtiapine  50 mg Per Tube BID   sodium chloride flush   10-40 mL Intracatheter Q12H   sodium chloride flush  3 mL Intravenous Q12H    have reviewed scheduled and prn medications.  Physical Exam: General: Critically ill looking male, intubated, sedated, not responding and has multiple lines for infusion. Heart:RRR, s1s2 nl Lungs: Multiple chest tubes and lines present. Abdomen: Abdomen is wall is edematous, mild distention present. Extremities: Both upper and lower extremities has pitting edema and  anasarca ++ Dialysis Access: Left groin temporary HD catheter was placed by PCCM on 8/28, site looks clean.  [name] 10/10/2021,9:31 AM  LOS: 12 days

## 2021-10-10 NOTE — Progress Notes (Signed)
3 Days Post-Op Procedure(s) (LRB): INSERTION OF RIGHT AXILLARY IMPELLA, DECANNULATION OF ECMO, AND MEDIASTINAL WASHOUT (N/A) TRANSESOPHAGEAL ECHOCARDIOGRAM (TEE) Subjective:  Had a stable night. Vasopressors down to low levels now. NE off. Epi 3, vaso 0.01 with MAP 70's to 80's. Dropped BP when last bit of vaso stopped.  Co-ox 74% this am. CVP 13. Impella remains at P4  -3869 cc yesterday with CRRT.  Rhythm is sinus 68 this am on amio 60/hr.  Objective: Vital signs in last 24 hours: Temp:  [96.6 F (35.9 C)-98.2 F (36.8 C)] 97.3 F (36.3 C) (08/31 0645) Pulse Rate:  [65-109] 67 (08/31 0645) Cardiac Rhythm: Normal sinus rhythm (08/31 0400) Resp:  [19-44] 20 (08/31 0645) BP: (93-158)/(55-68) 158/68 (08/30 1530) SpO2:  [100 %] 100 % (08/31 0645) Arterial Line BP: (79-212)/(9-79) 117/55 (08/31 0645) FiO2 (%):  [50 %] 50 % (08/31 0322) Weight:  [401 kg] 108 kg (08/31 0500)  Hemodynamic parameters for last 24 hours: CVP:  [10 mmHg-19 mmHg] 13 mmHg  Intake/Output from previous day: 08/30 0701 - 08/31 0700 In: 4321.2 [I.V.:2036.8; NG/GT:1599.7; IV Piggyback:480.2] Out: 8190 [Urine:87; Drains:3; Stool:1363; Chest Tube:100] Intake/Output this shift: No intake/output data recorded.  General appearance: edema improved.  Neurologic: sedated on vent but opening eyes spontaneously. Heart: regular rate and rhythm, S1, S2 normal, no murmur Lungs: clear to auscultation bilaterally Abdomen: soft, non-tender, bowel sounds present Extremities: extremities edematous, warm. Wound: open chest dressing stable. Impella site stable.  Lab Results: Recent Labs    10/09/21 1335 10/09/21 1429 10/10/21 0344  WBC 14.4*  --  14.3*  HGB 7.4* 6.8* 7.2*  HCT 22.5* 20.0* 22.5*  PLT 179  --  187   BMET:  Recent Labs    10/09/21 1531 10/10/21 0344  NA 139 137  K 3.9 3.7  CL 106 105  CO2 25 26  GLUCOSE 296* 281*  BUN 33* 33*  CREATININE 1.90* 1.91*  CALCIUM 7.8* 7.9*    PT/INR:   Recent Labs    10/10/21 0344  LABPROT 15.4*  INR 1.2   ABG    Component Value Date/Time   PHART 7.389 10/09/2021 0419   HCO3 26.7 10/09/2021 1429   TCO2 28 10/09/2021 1429   ACIDBASEDEF 2.0 10/07/2021 1814   O2SAT 73.7 10/10/2021 0344   CBG (last 3)  Recent Labs    10/09/21 1954 10/10/21 0012 10/10/21 0350  GLUCAP 263* 195* 269*   CXR: stable  Assessment/Plan: S/P Procedure(s) (LRB): INSERTION OF RIGHT AXILLARY IMPELLA, DECANNULATION OF ECMO, AND MEDIASTINAL WASHOUT (N/A) TRANSESOPHAGEAL ECHOCARDIOGRAM (TEE)  He has been more hemodynamically stable and vasopressors weaned to low level. Will continue epi 3 and vaso 0.01 to maintain MAP and support RV. Milrinone seemed to cause hypotension. Continue to remove volume with CRRT.  May be ready for washout and attempt at sternal closure by Sat morning. I would probably leave Impella in at that time.  Anemia: transfuse 1 unit PRBC's this am.  Hyperglycemia: add Novolog tube feed coverage every 4 hrs to Levemir and SSI.  DC JP from Impella site.  PAF/AFlutter: Now sinus rhythm on IV amio. Decreasing catecholamines has likely helped and he certainly likes sinus rhythm.  Will start low dose heparin per pharmacy with Impella, atrial fib/flutter.  Vent per CCM.  Plans discussed with heart failure team on rounds.     LOS: 12 days    [name] 10/10/2021

## 2021-10-10 NOTE — Progress Notes (Addendum)
Patient ID: [name], male   DOB: 17-Mar-1960, 61 y.o.   MRN: [id number]    Advanced Heart Failure Rounding Note   Subjective:    - 8/19 Pericardial window - 8/20 Cardiac arrest with tamponade -> Emergent bedside washout - 09/29/21 VA Cannulation - 09/30/21 Return to OR for mediastinal hemorrhage - 10/01/21 Developed AF -> amio - 10/02/21 TEE EF 25-30%  - 10/04/21 OR for washout. C/b continued bleeding overnight - 10/07/21 Placement of Impella 5.5 with washout, VA ECMO decannulation. Hypotensive with development of severe RV dysfunction after ECMO off and pressors titrated up. Multiple units of blood products. - 10/08/21 Brief PEA arrest. AFL with RVR >> S/p DCCV to SR, back in AFL shortly after  Remains sedated on the vent  CO-OX 82%. Currently on Epi 3, Vaso 0.01.  MAP 74%  On CRRT, pulling for negative 150/hr.   LDH 1,299>439>412>353  Hgb 9.5>8.6>7.9>7.2  Impella 5.5 P4 Flow 2.0 L/min   Echo 08/28: LV EF 55-60% with severe LVH, small ventricular cavity, RV appears to have near normal systolic function   Objective:     Vital Signs:   Temp:  [96.6 F (35.9 C)-98.2 F (36.8 C)] 97.3 F (36.3 C) (08/31 0645) Pulse Rate:  [65-109] 67 (08/31 0645) Resp:  [19-44] 20 (08/31 0645) BP: (93-158)/(55-68) 158/68 (08/30 1530) SpO2:  [100 %] 100 % (08/31 0645) Arterial Line BP: (79-212)/(9-79) 117/55 (08/31 0645) FiO2 (%):  [50 %] 50 % (08/31 0322) Weight:  [108 kg] 108 kg (08/31 0500) Last BM Date : 10/09/21  Weight change: Filed Weights   10/08/21 0500 10/09/21 0440 10/10/21 0500  Weight: 112.8 kg 105.1 kg 108 kg    Intake/Output:   Intake/Output Summary (Last 24 hours) at 10/10/2021 0719 Last data filed at 10/10/2021 0700 Gross per 24 hour  Intake 4321.19 ml  Output 8190 ml  Net -3868.81 ml    CVP 13 Physical Exam: General:  Intubated/Sedated on Impella/CRRT HEENT: ETT + Cortrak  Neck: supple. JVP difficulty to assess Carotids 2+ bilat; no bruits. No  lymphadenopathy or thryomegaly appreciated. Cor: PMI nondisplaced. Regular rate & rhythm. No rubs, gallops or murmurs. Open chest. R upper chest impella. CT/MTs.  Lungs: clear Abdomen: soft, nontender, nondistended. No hepatosplenomegaly. No bruits or masses. Good bowel sounds. Extremities: no cyanosis, clubbing, rash, R and LLE 1+ edeam. Neuro: Sedated on vent.   Telemetry: SR 60s personally checked.   Labs: Basic Metabolic Panel: Recent Labs  Lab 10/04/21 0256 10/04/21 2951 10/08/21 0350 10/08/21 0559 10/08/21 1515 10/09/21 0410 10/09/21 0419 10/09/21 0808 10/09/21 1429 10/09/21 1531 10/10/21 0344  NA 144   < > 139   < > 141 139 137 137 138 139 137  K 4.5   < > 4.6   < > 4.6 4.1 5.5* 4.1 4.0 3.9 3.7  CL 110   < > 107  --  104 100  --  103  --  106 105  CO2 28   < > 23  --  25 25  --  25  --  25 26  GLUCOSE 142*   < > 253*  --  295* 328*  --  329*  --  296* 281*  BUN 50*   < > 51*  --  39* 35*  --  33*  --  33* 33*  CREATININE 2.47*   < > 2.67*  --  2.34* 2.01*  --  1.94*  --  1.90* 1.91*  CALCIUM 7.2*   < >  7.8*  --  8.0* 8.2*  --  8.2*  --  7.8* 7.9*  MG 2.3  --  2.1  --  2.2 2.4  --   --   --   --  2.4  PHOS  --   --  4.8*  --  3.8 3.0  --   --   --  2.4* 2.3*   < > = values in this interval not displayed.    Liver Function Tests: Recent Labs  Lab 10/04/21 0256 10/05/21 0441 10/06/21 1600 10/07/21 0408 10/08/21 0350 10/08/21 1515 10/09/21 0410 10/09/21 1531 10/10/21 0344  AST 22 28 99* 97*  --   --   --   --   --   ALT 12 11 10 15   --   --   --   --   --   ALKPHOS 43 37* 73 89  --   --   --   --   --   BILITOT 1.1 1.0 2.2* 1.1  --   --   --   --   --   PROT 3.7* 3.7* 3.9* 4.1*  --   --   --   --   --   ALBUMIN 2.0* 2.0* 1.9* 1.9* 2.6* 2.5* 2.5* 2.3* 2.3*   No results for input(s): "LIPASE", "AMYLASE" in the last 168 hours. No results for input(s): "AMMONIA" in the last 168 hours.  CBC: Recent Labs  Lab 10/08/21 0350 10/08/21 0559 10/08/21 1515  10/09/21 0410 10/09/21 0419 10/09/21 1335 10/09/21 1429 10/10/21 0344  WBC 16.0*  --  16.0* 16.0*  --  14.4*  --  14.3*  HGB 9.5*   < > 8.6* 7.9* 7.5* 7.4* 6.8* 7.2*  HCT 28.1*   < > 25.1* 23.9* 22.0* 22.5* 20.0* 22.5*  MCV 88.6  --  89.3 89.8  --  92.2  --  93.8  PLT 153  --  176 200  --  179  --  187   < > = values in this interval not displayed.    Cardiac Enzymes: No results for input(s): "CKTOTAL", "CKMB", "CKMBINDEX", "TROPONINI" in the last 168 hours.  BNP: BNP (last 3 results) Recent Labs    12/24/20 0651 09/28/21 0405  BNP 412.0* 826.6*    ProBNP (last 3 results) No results for input(s): "PROBNP" in the last 8760 hours.    Other results:  Imaging: DG CHEST PORT 1 VIEW  Result Date: 10/09/2021 CLINICAL DATA:  Status post cardiac surgery EXAM: PORTABLE CHEST 1 VIEW COMPARISON:  Chest radiograph 10/08/2021 FINDINGS: The endotracheal tube tip is approximately 3.0 cm from the carina. An Impella device projects over the left ventricle. A right IJ vascular catheter terminates in the mid to lower SVC. A left IJ vascular catheter terminates in the region of the confluence of left brachiocephalic vein/upper SVC, stable. Left-sided chest tubes and a presumed mediastinal drain are stable. The enteric catheter tip is off the field of view. The cardiomediastinal silhouette is stable. Hazy opacities projecting over the lower lobes likely reflect layering pleural effusions, similar to the prior study. There is no new or worsening focal airspace disease. There is no pneumothorax. The bones are stable. IMPRESSION: 1. Support devices as above. 2. Layering bilateral pleural effusions, unchanged. No new or worsening focal airspace disease. No pneumothorax. Electronically Signed   By: Valetta Mole M.D.   On: 10/09/2021 08:10     Medications:     Scheduled Medications:  acetaminophen  1,000 mg Oral Q6H  Or   acetaminophen (TYLENOL) oral liquid 160 mg/5 mL  1,000 mg Per Tube Q6H    acetaminophen (TYLENOL) oral liquid 160 mg/5 mL  650 mg Per Tube Once   Or   acetaminophen  650 mg Rectal Once   atorvastatin  80 mg Per Tube Daily   Chlorhexidine Gluconate Cloth  6 each Topical Daily   clonazePAM  1 mg Per Tube Q8H   colchicine  0.6 mg Per Tube Daily   feeding supplement (PROSource TF20)  60 mL Per Tube QID   insulin aspart  0-15 Units Subcutaneous Q4H   insulin detemir  45 Units Subcutaneous BID   metoCLOPramide (REGLAN) injection  10 mg Intravenous Q6H   mouth rinse  15 mL Mouth Rinse Q2H   pantoprazole (PROTONIX) IV  40 mg Intravenous QHS   polyethylene glycol  17 g Per Tube Daily   QUEtiapine  50 mg Per Tube BID   sodium chloride flush  10-40 mL Intracatheter Q12H   sodium chloride flush  3 mL Intravenous Q12H    Infusions:   prismasol BGK 4/2.5 400 mL/hr at 10/09/21 1937    prismasol BGK 4/2.5 400 mL/hr at 10/09/21 1938   albumin human Stopped (10/08/21 1821)   amiodarone 60 mg/hr (10/10/21 0700)   epinephrine 3 mcg/min (10/10/21 0700)   feeding supplement (PIVOT 1.5 CAL) 60 mL/hr at 10/10/21 0700   HYDROmorphone 4 mg/hr (10/10/21 0700)   lactated ringers     lactated ringers 10 mL/hr at 10/10/21 0700   meropenem (MERREM) IV Stopped (10/10/21 0221)   midazolam 7 mg/hr (10/10/21 0700)   nitroGLYCERIN Stopped (10/07/21 1556)   norepinephrine (LEVOPHED) Adult infusion Stopped (10/10/21 0423)   prismasol BGK 4/2.5 1,500 mL/hr at 10/10/21 0530   sodium bicarbonate 25 mEq (Impella PURGE) in dextrose 5 % 1000 mL bag     vancomycin Stopped (10/09/21 2110)   vasopressin 0.01 Units/min (10/10/21 0700)    PRN Medications: albumin human, artificial tears, dextrose, heparin, HYDROmorphone, midazolam, midazolam, ondansetron (ZOFRAN) IV, sodium chloride, sodium chloride flush, sodium chloride flush   Assessment/Plan:    1. Shock - mixed cardiogenic/hemorrhagic -> VA ECMO -> decannulated on 8/28 to Impella 5.5 - Echo 08/28: Underfilled LV, EF 55-60% with severe  LVH and near normal RV systolic function.  - Impella remains at P4, Flow 2.  - On Epi 3 and  vaso 0.01. CO-OX 74% this am. Continue current drips. No plan to wean.  - CVP 13. Continues on CRRT to manage volume, currently pulling for negative 150/hr.  - Continue to remove volume with plan for possible chest closure tomorrow versus Sat.    2. Cardiac arrest (PEA/bradycardic) - 8/19 in setting of tamponade - Brief PEA arrest again on 08/28  3. Cardiac tamponade with emergent bedside sternotomy  - Diffuse epicardial bleeding with post-op Dresslers - return to OR 8/21 and 8/24 for washouts.  - Washout 8/28 in OR. Multiple units of blood products in OR. Hgb 9.5>8.6>7.9>7.2   - Getting 1UPRBCs today.  - CT/MT minimal blood output.  - Consider heparin drip.   4. Acute hypoxemic respiratory failure - Off ECMO.  - Vent support per CCM  5. AKI due to ATN - CRRT started 08/28. 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 Continues on colchicine.  7. 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 ASA held as above  8. DM2 - continue SSI + levemir  9. PAF/AFL - Tolerates poorly.  - Back in AFL. S/p DCCV  to SR 08/29.  - In SR today.  - On amio gtt at [place], continue  10. ID - On vancomycin/meropenem with open chest.   11. Neuro - Has followed commands with vent wean.  - Requiring high dose sedation.   12. FEN - TFs ongoing  Discussed with Dr Cyndia Bent at the bedside. Continue Epi and Vaso at current rate. Continue fluid removal. Possible chest closure tomorrow versus Sat.     Length of Stay: [place] NP-C  10/10/2021, 7:19 AM  Advanced Heart Failure Team Pager [phone] (M-F; 7a - 4p)  Please contact [facility] for night-coverage after hours (4p -7a ) and weekends on [email or web address]   Agree with above.   Chest remains open. Sedated on vent with Impella 5.5 and CRRT support.   On Epi 3  and VP 0.01. Co-ox 74% MAPS stable. Maintaining NSR on IV amio.   Pulling well on CRT. On broad spectrum abx   General:  Intubated/sedated HEENT: normal Neck: supple.+ TLC Carotids 2+ bilat; no bruits. No lymphadenopathy or thryomegaly appreciated. Cor: Chest open  Regular rate & rhythm. No rubs, gallops or murmurs. Impella site ok  Lungs: coarse Abdomen: soft, nontender, nondistended. No hepatosplenomegaly. No bruits or masses. Good bowel sounds. Extremities: no cyanosis, clubbing, rash   2+ edema Neuro: intubated sedated   Critically ill but progressing slowly. Continue vasopressor support to pull as much fluid as possible on CRRT. Impella flows look good. Would avoid heparin at this point and just continue to use bicarb in purge. Can use enox for DVT prophylaxis.   D/w Dr. Cyndia Bent. Plan chest closure Saturday am and will leave Impella ina t that time.   CRITICAL CARE Performed by: Glori Bickers  Total critical care time: 45 minutes  Critical care time was exclusive of separately billable procedures and treating other patients.  Critical care was necessary to treat or prevent imminent or life-threatening deterioration.  Critical care was time spent personally by me (independent of midlevel providers or residents) on the following activities: development of treatment plan with patient and/or surrogate as well as nursing, discussions with consultants, evaluation of patient's response to treatment, examination of patient, obtaining history from patient or surrogate, ordering and performing treatments and interventions, ordering and review of laboratory studies, ordering and review of radiographic studies, pulse oximetry and re-evaluation of patient's condition.  Glori Bickers, MD  8:24 AM

## 2021-10-10 NOTE — Progress Notes (Signed)
NAME:  [name], MRN:  [id number], DOB:  [birth year], LOS: 12 ADMISSION DATE:  09/28/2021, CONSULTATION DATE:  09/29/21 REFERRING MD:  Dr. Kipp Brood, CHIEF COMPLAINT:  Cardiac Arrest   History of Present Illness:  [name] is a 61 year old male with DMII, , CVA, atrial fibrillation on eliquis and multivessel CAD s/p 5 vessel CABG on 09/12/21 who presented 8/19 with shortness of breath via EMS. He was found to be bradycardic and hypoglycemic. He symptomatically improved with dextrose but continued to have bradycardia and soft blood pressures. ECHO obtained which showed LVEF 50%, grade II diastolic dysfunction and moderate pericardial effusion with possible tamponade physiology. CT Chest showed pericardial effusion and small bilateral pleural effusions. He was taken to the OR 8/19 for pericardial window.   Patient started having issues with tachy-brady arrhythmias on telemetry, decreased urine output and increased work of breathing over night. A 14 french pigtail catheter was placed in the left pleural space this morning with bloody drainage. At 10:35am patient became unresponsive with PEA arrest. He underwent multiple rounds of CPR with epinephrine pushes. ROSC was obtained and patient was intubated. Bedside US by cardiology showed large pericardial effusion with tamponade physiology. He lost pulse again and CPR was again performed. Please see code sheet for full details. Bedside sternotomy performed with pericardial drainage by CT surgery, immediate improvement in hemodynamics achieved with drainage of the pericardial space.   Pertinent  Medical History   Past Medical History:  Diagnosis Date   AKI (acute kidney injury) ([place])    pt unaware of this   Anginal pain ([place])    CAD (coronary artery disease)    a. 03/2015 NSTEMI: LHC with severe 3V CAD  (70% mid RCA, 95% OM1, 90% distal LCx, 90% OM3, 80% prox LAD and 90% ost D1) s/p DES to mLAD w/ small dissction Rx with DES, staged ost Ramus PCI/DES and  dLCx s/p PCI/DES    Chest pain 12/24/2020   Diabetes mellitus type 2 in obese [facility])    Diverticulosis    Dyspnea    Dyspnea on exertion 03/16/2015   Dyspnea on exertion   Family history of adverse reaction to anesthesia    patient father- pt states after anesthesia his father "developed dementia"   GERD (gastroesophageal reflux disease)    Hypercholesteremia    Hypertension associated with diabetes ([place]) 03/16/2015   hypertension   NSTEMI (non-ST elevated myocardial infarction) ([place]) 03/17/2015   Obesity    Stroke ([place]) 2022   pt states he had a "mini stroke" during cardiac catheterization   Tobacco abuse    Significant Hospital Events: Including procedures, antibiotic start and stop dates in addition to other pertinent events   8/19 admitted, s/p pericardial window 8/20 PEA cardiac arrest, left pigtail chest tube placement, PEA cardiac arrest due to tamponade, bedside sternotomy with pericardial drains placed. 8/21 return to OR for overnight bleeding.  8/22 minimal chest tube output.  Atrial fibrillation, controled with amiodarone.  8/22 TEE showed EF 35% at baseline, which improved significantly with decreasing ECMO flow.  8/23 tolerated diuresis  8/24 mediastinal washout, removal of hematoma 8/25 ongoing bleeding issues from posterior mediastinum 8/29 decannulated and off ECMO, 5.5 Impella placed, L femoral HD cath placed and started on CRRT 8/30 hypoxia and hypotension followed by brief arrest, gentle L lateral chest compressions performed and ROSC in roughly 1 minute. 8/31 remains deeply sedated on ventilator with open chest.  No acute events overnight  Interim History / Subjective:  Deeply  sedated on ventilator Remains on IV amiodarone, epi, Dilaudid, Versed, and vasopressin 10 L positive thus far, on CRRT  Objective   Blood pressure (!) 142/63, pulse 74, temperature (!) 97 F (36.1 C), resp. rate 20, height 5\' 7"  (1.702 m), weight 108 kg, SpO2 100 %. CVP:  [10 mmHg-19  mmHg] 13 mmHg  Vent Mode: PRVC FiO2 (%):  [50 %] 50 % Set Rate:  [20 bmp] 20 bmp Vt Set:  [530 mL] 530 mL PEEP:  [8 cmH20] 8 cmH20 Plateau Pressure:  [23 cmH20-25 cmH20] 24 cmH20   Intake/Output Summary (Last 24 hours) at 10/10/2021 4081 Last data filed at 10/10/2021 0700 Gross per 24 hour  Intake 4154.97 ml  Output 7915 ml  Net -3760.03 ml    Filed Weights   10/08/21 0500 10/09/21 0440 10/10/21 0500  Weight: 112.8 kg 105.1 kg 108 kg   Examination: General: Acute on chronic ill-appearing middle-aged male lying in bed on mechanical ventilation in no acute distress HEENT: ETT, MM pink/moist, PERRL,  Neuro: Deeply sedated on ventilator, will open eyes to physical stimuli but unable to follow commands CV: s1s2 regular rate and rhythm, no murmur, rubs, or gallops,  PULM: Clear to auscultation bilaterally, no increased work of breathing, no added breath sounds, tolerating ventilator GI: soft, bowel sounds active in all 4 quadrants, non-tender, non-distended, tolerating TF Extremities: warm/dry, generalized edema  Skin: no rashes or lesions  Assessment & Plan:   Hemorraghic pericarditis w/ cardiac tamponade/obstructive shock causing PEA arrest s/p pericardial drain placement with subsequent cardiac stunning on VA ECMO  -Weaned off VA ECMO, impella 5.5 placed 8/28  in OR with pericardial washout and ECMO decannulation -Return to OR 8/21 and 8/24 for chest washout, and to return to the OR 9/2 for additional washout and possible closure of chest Coronary artery disease s/p CABG x 5 8/3 Paroxysmal atrial fibrillation/a.fluter Acute RV failure with cardiogenic & distributive shock -Echocardiogram 8/26 with underfilled LVEF 55 to be 60% severe LVH and near normal RV systolic function P: Primary management per heart failure and cardiothoracic surgery Impella per heart failure Continue vasopressors and inotropes per TCTS Anticoagulation per RT CTS Remains on IV amiodarone Deep sedation  while chest open  Acute hypoxemic and hypercapnic respiratory failure -In the setting of above P: Continue ventilator support with lung protective strategies  Hold SBT until chest closure Wean PEEP and FiO2 for sats greater than 90%. Head of bed elevated 30 degrees. Plateau pressures less than 30 cm H20.  Follow intermittent chest x-ray and ABG.   SAT/SBT as tolerated, mentation preclude extubation  Ensure adequate pulmonary hygiene  Follow cultures  VAP bundle in place  PAD protocol  Acute metabolic encephalopathy due  -Secondary to sedation; has previously been able to follow commands but was agitated. P: Maintain neuro protective measures; goal for eurothermia, euglycemia, eunatermia, normoxia, and PCO2 goal of 35-40 Nutrition and bowel regiment  Seizure precautions  Aspirations precautions  Try to wean sedation as able but unable to wean significantly given open chest  Sepsis due to pericarditis/Dresslers P: Continue vancomycin and meropenem given in chest and multiple OR trips/lines Monitor fever curve WBCs  Acute Kidney 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 Hypervolemia Hypocalcemia - s/p repletion 2g Ca gluconate P: Neurology following, appreciate assistance Continue CRRT per nephrology Follow renal function  Monitor urine output Trend Bmet Avoid nephrotoxins Ensure adequate renal perfusion   Acute blood loss anemia -Ongoing issue with any AC challenge P: Transfuse 1 unit PRBC today 8/31 Trend CBC Transfuse per  protocol Hemoglobin goal greater than 8  Hx of DMII P: Continue SSI, Levemir, and increased to get coverage CBG goal 140-180  Intolerance to TF P: Continue tube feeds  Best Practice (right click and "Reselect all SmartList Selections" daily)   Diet/type: TFs DVT prophylaxis: see discussion above GI prophylaxis: PPI Lines: Central line and yes and it is still needed Foley:  Yes, and it is still needed Code Status:  full code Last date of multidisciplinary  goals of care discussion [Per primary]  Critical care: 37 min  Anjolina Byrer D. Kenton Kingfisher, NP-C [facility] Personal contact information can be found on Amion  10/10/2021, 8:15 AM

## 2021-10-10 NOTE — Progress Notes (Signed)
This chaplain is present for F/U spiritual care. The chaplain reviewed the chart notes and was updated by RN-Kara before the visit.  The chaplain shared a blessing with the Pt. as she entered. The Pt. fraternity brother-Brian and friend since the 10th grade is at the bedside. The chaplain understands from [name] the Pt. wife-Colette prayed with the Pt. using Brian's phone. [name] remains hopeful for improvement through God's presence and the medical team's expertise. [name] is humbled by care the Pt. is receiving in the ICU and appreciative of the visit.  This chaplain is available for F/U spiritual care as needed.  Chaplain Sallyanne Kuster [phone]

## 2021-10-10 NOTE — Progress Notes (Signed)
Nutrition Follow-up  DOCUMENTATION CODES:   Not applicable  INTERVENTION:   Tube Feeding via Cortrak:  Pivot 1.5 at 65 ml/hr This provides 146 g of protein, 2340 kcals, 1186 mL of free water  D/C Pro-Source TF20 for now  Phosphorus trending down on CRRT; recommend supplementation to maintain phosphorus >2.0  Consider holding or reducing bowel regimen given stool output >1L in 24 hours, continue to monitor  NUTRITION DIAGNOSIS:   Inadequate oral intake related to acute illness as evidenced by NPO status.  Being addressed via TF  GOAL:   Patient will meet greater than or equal to 90% of their needs  Progressing  MONITOR:   Vent status, Labs, Weight trends, TF tolerance  REASON FOR ASSESSMENT:   Ventilator    ASSESSMENT:   61 yo male admitted with acute on chronic CHF with recent CABG, symptomatic bradycardia and hypotension. PMH includes DM, CAD s/p recent CABG x 5, CKD 3, CVA  8/19 Pericardial window, evacuation of pericardial and pleural effusion 8/20 Intubated, Bedside mediastinal re-exploration, cardiac tamponade, mediastinal clot; PEA arrest due to tamponade, VA ECMO cannulation 8/21 Return to OR for bleeding, mediastinal re-exploration, Cortrak placed-gastric 8/23 TEE with EF 25-30% 8/25 Cortrak advanced to Post-Pyloric position 8/28 VA ECMO decannulation, Impella placed, mediastinal washout, CRRT initiated 8/29 Brief arrest  Pt remains on vent support with Impella and CRRT Pressor requirements down; Levophed off, vasopressin off, epinephrine at 3  Noted plan for possible chest closure on Sat 9/2, Impella to remain in place   Pivot 1.5 at 60 ml/hr via Cortrak, tolerating  Rectal tube in place; 1363 mL in 24 hours, stool slowed overnight. Not too bad today per RN. Pt remains on reglan, miralax  Net +10 L; current wt 108 kg. Moderate edema on exam  Labs: phosphorus 2.1 (L) Meds: ss novolog, novolog q 4  hours, levemir, reglan   Diet Order:   Diet  Order             Diet NPO time specified  Diet effective now                   EDUCATION NEEDS:   Not appropriate for education at this time  Skin:  Skin Assessment: Skin Integrity Issues: Skin Integrity Issues:: Incisions, Other (Comment) Incisions: open chest Other: dehisced wound (previous surgical incision) to R medial pretibial  Last BM:  8/29 rectal tube  Height:   Ht Readings from Last 1 Encounters:  09/29/21 5\' 7"  (1.702 m)    Weight:   Wt Readings from Last 1 Encounters:  10/10/21 108 kg   BMI:  Body mass index is 37.29 kg/m.  Estimated Nutritional Needs:   Kcal:  2000-2200 kcals  Protein:  130-155 g  Fluid:  1.8 L   Kerman Passey MS, RDN, LDN, CNSC Registered Dietitian 3 Clinical Nutrition RD Pager and On-Call Pager Number Located in [place]

## 2021-10-10 NOTE — Progress Notes (Signed)
Patient's left pupil slightly smaller than right (2 vs 3). Bartle MD aware.

## 2021-10-10 NOTE — Progress Notes (Signed)
CT surgery PM rounds  Patient had very stable day, excellent fluid removal with CRRT-I/O balance -2500 so far today  Patient made purposeful responses to the nurses today. Impella 5.5 site without hematoma Plan chest closure in the next 48 hours.  Blood pressure 139/62, pulse 64, temperature (!) 96.4 F (35.8 C), resp. rate 20, height 5\' 7"  (1.702 m), weight 108 kg, SpO2 100 %.

## 2021-10-11 ENCOUNTER — Inpatient Hospital Stay (HOSPITAL_COMMUNITY): Payer: Self-pay

## 2021-10-11 LAB — RENAL FUNCTION PANEL
Albumin: 2.4 g/dL — ABNORMAL LOW (ref 3.5–5.0)
Albumin: 2.3 g/dL — ABNORMAL LOW (ref 3.5–5.0)
Anion gap: 6 (ref 5–15)
Anion gap: 5 (ref 5–15)
BUN: 32 mg/dL — ABNORMAL HIGH (ref 8–23)
BUN: 30 mg/dL — ABNORMAL HIGH (ref 8–23)
CO2: 27 mmol/L (ref 22–32)
CO2: 27 mmol/L (ref 22–32)
Calcium: 8 mg/dL — ABNORMAL LOW (ref 8.9–10.3)
Calcium: 8 mg/dL — ABNORMAL LOW (ref 8.9–10.3)
Chloride: 105 mmol/L (ref 98–111)
Chloride: 105 mmol/L (ref 98–111)
Creatinine, Ser: 1.79 mg/dL — ABNORMAL HIGH (ref 0.61–1.24)
Creatinine, Ser: 1.74 mg/dL — ABNORMAL HIGH (ref 0.61–1.24)
GFR, Estimated: 43 mL/min — ABNORMAL LOW (ref >60)
GFR, Estimated: 44 mL/min — ABNORMAL LOW (ref >60)
Glucose, Bld: 202 mg/dL — ABNORMAL HIGH (ref 70–99)
Glucose, Bld: 176 mg/dL — ABNORMAL HIGH (ref 70–99)
Phosphorus: 1.9 mg/dL — ABNORMAL LOW (ref 2.5–4.6)
Phosphorus: 2.7 mg/dL (ref 2.5–4.6)
Potassium: 4.1 mmol/L (ref 3.5–5.1)
Potassium: 3.9 mmol/L (ref 3.5–5.1)
Sodium: 138 mmol/L (ref 135–145)
Sodium: 137 mmol/L (ref 135–145)

## 2021-10-11 LAB — CBC
HCT: 25.6 % — ABNORMAL LOW (ref 39.0–52.0)
Hemoglobin: 8 g/dL — ABNORMAL LOW (ref 13.0–17.0)
MCH: 29.6 pg (ref 26.0–34.0)
MCHC: 31.3 g/dL (ref 30.0–36.0)
MCV: 94.8 fL (ref 80.0–100.0)
Platelets: 233 10*3/uL (ref 150–400)
RBC: 2.7 MIL/uL — ABNORMAL LOW (ref 4.22–5.81)
RDW: 17.1 % — ABNORMAL HIGH (ref 11.5–15.5)
WBC: 15.3 10*3/uL — ABNORMAL HIGH (ref 4.0–10.5)
nRBC: 0.4 % — ABNORMAL HIGH (ref 0.0–0.2)

## 2021-10-11 LAB — GLUCOSE, CAPILLARY
Glucose-Capillary: 124 mg/dL — ABNORMAL HIGH (ref 70–99)
Glucose-Capillary: 161 mg/dL — ABNORMAL HIGH (ref 70–99)
Glucose-Capillary: 167 mg/dL — ABNORMAL HIGH (ref 70–99)
Glucose-Capillary: 169 mg/dL — ABNORMAL HIGH (ref 70–99)
Glucose-Capillary: 172 mg/dL — ABNORMAL HIGH (ref 70–99)
Glucose-Capillary: 185 mg/dL — ABNORMAL HIGH (ref 70–99)

## 2021-10-11 LAB — MAGNESIUM: Magnesium: 2.4 mg/dL (ref 1.7–2.4)

## 2021-10-11 LAB — COOXEMETRY PANEL
Carboxyhemoglobin: 1.3 % (ref 0.5–1.5)
Methemoglobin: 0.9 % (ref 0.0–1.5)
O2 Saturation: 81.3 %
Total hemoglobin: 8.3 g/dL — ABNORMAL LOW (ref 12.0–16.0)

## 2021-10-11 LAB — CALCIUM, IONIZED: Calcium, Ionized, Serum: 4.9 mg/dL (ref 4.5–5.6)

## 2021-10-11 LAB — PROTIME-INR
INR: 1.1 (ref 0.8–1.2)
Prothrombin Time: 13.6 seconds (ref 11.4–15.2)

## 2021-10-11 LAB — LACTATE DEHYDROGENASE: LDH: 298 U/L — ABNORMAL HIGH (ref 98–192)

## 2021-10-11 MED ORDER — SODIUM PHOSPHATES 45 MMOLE/15ML IV SOLN
30.0000 mmol | Freq: Once | INTRAVENOUS | Status: AC
  Administered 2021-10-11: 30 mmol via INTRAVENOUS
  Filled 2021-10-11: qty 10

## 2021-10-11 MED ORDER — INSULIN ASPART 100 UNIT/ML IJ SOLN
12.0000 [IU] | INTRAMUSCULAR | Status: AC
  Administered 2021-10-11 (×3): 12 [IU] via SUBCUTANEOUS

## 2021-10-11 NOTE — Progress Notes (Addendum)
Patient ID: RICHERD GRIME, male   DOB: October 29, 1960, 61 y.o.   MRN: [id number]    Advanced Heart Failure Rounding Note   Subjective:    - 8/19 Pericardial window - 8/20 Cardiac arrest with tamponade -> Emergent bedside washout - 09/29/21 VA Cannulation - 09/30/21 Return to OR for mediastinal hemorrhage - 10/01/21 Developed AF -> amio - 10/02/21 TEE EF 25-30%  - 10/04/21 OR for washout. C/b continued bleeding overnight - 10/07/21 Placement of Impella 5.5 with washout, VA ECMO decannulation. Hypotensive with development of severe RV dysfunction after ECMO off and pressors titrated up. Multiple units of blood products. - 10/08/21 Brief PEA arrest. AFL with RVR >> S/p DCCV to SR, back in AFL shortly after - 8/31 Give 1UPRBCs   Remains sedated on the vent  CO-OX 81%. Currently on Epi 3, Vaso 0.01.    On CRRT, pulling for negative 150/hr.   LDH 1,299>439>412>353>298  Hgb 7.2 given blood> today 8  Impella 5.5 P4 Flow 2.0 L/min   Echo 08/28: LV EF 55-60% with severe LVH, small ventricular cavity, RV appears to have near normal systolic function   Objective:     Vital Signs:   Temp:  [96.3 F (35.7 C)-99.3 F (37.4 C)] 99 F (37.2 C) (09/01 0700) Pulse Rate:  [61-77] 67 (09/01 0700) Resp:  [18-24] 20 (09/01 0700) BP: (116-139)/(55-62) 139/62 (08/31 1543) SpO2:  [100 %] 100 % (09/01 0700) Arterial Line BP: (113-172)/(53-79) 141/59 (09/01 0700) FiO2 (%):  [50 %] 50 % (09/01 0325) Weight:  [106.4 kg] 106.4 kg (09/01 0432) Last BM Date : 10/10/21  Weight change: Filed Weights   10/09/21 0440 10/10/21 0500 10/11/21 0432  Weight: 105.1 kg 108 kg 106.4 kg    Intake/Output:   Intake/Output Summary (Last 24 hours) at 10/11/2021 0801 Last data filed at 10/11/2021 0700 Gross per 24 hour  Intake 4381.62 ml  Output 7262 ml  Net -2880.38 ml    CVP 11-12  Physical Exam: General:  Intubated/sedate HEENT: ETT + Cortrak  Neck: supple. JVP difficult to assess. no JVD. Carotids 2+ bilat;  no bruits. No lymphadenopathy or thryomegaly appreciated. Cor: PMI nondisplaced. Regular rate & rhythm. No rubs, gallops or murmurs. R Impella 5.5 Lungs: clear Abdomen: soft, nontender, nondistended. No hepatosplenomegaly. No bruits or masses. Good bowel sounds. Extremities: no cyanosis, clubbing, rash, edema Neuro: Sedated on Vent.  General:  Intubated/Sedated on Impella/CRRT   Telemetry: SR 60s personally checked.  Labs: Basic Metabolic Panel: Recent Labs  Lab 10/08/21 0350 10/08/21 0559 10/08/21 1515 10/09/21 0410 10/09/21 0419 10/09/21 7510 10/09/21 1429 10/09/21 1531 10/10/21 0344 10/10/21 1535 10/11/21 0339  NA 139   < > 141 139   < > 137 138 139 137 139 138  K 4.6   < > 4.6 4.1   < > 4.1 4.0 3.9 3.7 4.0 4.1  CL 107  --  104 100  --  103  --  106 105 107 105  CO2 23  --  25 25  --  25  --  25 26 25 27   GLUCOSE 253*  --  295* 328*  --  329*  --  296* 281* 271* 202*  BUN 51*  --  39* 35*  --  33*  --  33* 33* 35* 32*  CREATININE 2.67*  --  2.34* 2.01*  --  1.94*  --  1.90* 1.91* 1.77* 1.79*  CALCIUM 7.8*  --  8.0* 8.2*  --  8.2*  --  7.8* 7.9*  7.8* 8.0*  MG 2.1  --  2.2 2.4  --   --   --   --  2.4  --  2.4  PHOS 4.8*  --  3.8 3.0  --   --   --  2.4* 2.3* 2.1* 1.9*   < > = values in this interval not displayed.    Liver Function Tests: Recent Labs  Lab 10/05/21 0441 10/06/21 1600 10/07/21 0408 10/08/21 0350 10/09/21 0410 10/09/21 1531 10/10/21 0344 10/10/21 1535 10/11/21 0339  AST 28 99* 97*  --   --   --   --   --   --   ALT 11 10 15   --   --   --   --   --   --   ALKPHOS 37* 73 89  --   --   --   --   --   --   BILITOT 1.0 2.2* 1.1  --   --   --   --   --   --   PROT 3.7* 3.9* 4.1*  --   --   --   --   --   --   ALBUMIN 2.0* 1.9* 1.9*   < > 2.5* 2.3* 2.3* 2.5* 2.4*   < > = values in this interval not displayed.   No results for input(s): "LIPASE", "AMYLASE" in the last 168 hours. No results for input(s): "AMMONIA" in the last 168 hours.  CBC: Recent  Labs  Lab 10/08/21 1515 10/09/21 0410 10/09/21 0419 10/09/21 1335 10/09/21 1429 10/10/21 0344 10/11/21 0339  WBC 16.0* 16.0*  --  14.4*  --  14.3* 15.3*  HGB 8.6* 7.9* 7.5* 7.4* 6.8* 7.2* 8.0*  HCT 25.1* 23.9* 22.0* 22.5* 20.0* 22.5* 25.6*  MCV 89.3 89.8  --  92.2  --  93.8 94.8  PLT 176 200  --  179  --  187 233    Cardiac Enzymes: No results for input(s): "CKTOTAL", "CKMB", "CKMBINDEX", "TROPONINI" in the last 168 hours.  BNP: BNP (last 3 results) Recent Labs    12/24/20 0651 09/28/21 0405  BNP 412.0* 826.6*    ProBNP (last 3 results) No results for input(s): "PROBNP" in the last 8760 hours.    Other results:  Imaging: DG CHEST PORT 1 VIEW  Result Date: 10/10/2021 CLINICAL DATA:  [id number]; ETT, chest tube present, status post Impella follow-up study EXAM: PORTABLE CHEST 1 VIEW COMPARISON:  Chest x-ray from yesterday FINDINGS: Again seen are the endotracheal tube, feeding tube, bilateral transjugular catheters, Impella catheter, left-sided chest tubes and presumed mediastinal drainage tube are stable. The tip of the enteric tube is off the field of view. Cardiopericardial silhouette is mildly enlarged, stable. There is some opacity seen at the left lung base, likely atelectasis, stable. Minor atelectasis at the right lung base, stable as well. The visualized skeletal structures are unremarkable. IMPRESSION: 1.  Life supporting apparatus are stable. 2. Atelectasis at the left lung base without significant interval change. Electronically Signed   By: Frazier Richards M.D.   On: 10/10/2021 08:12     Medications:     Scheduled Medications:  acetaminophen  1,000 mg Oral Q6H   Or   acetaminophen (TYLENOL) oral liquid 160 mg/5 mL  1,000 mg Per Tube Q6H   acetaminophen (TYLENOL) oral liquid 160 mg/5 mL  650 mg Per Tube Once   Or   acetaminophen  650 mg Rectal Once   atorvastatin  80 mg Per Tube Daily   Chlorhexidine  Gluconate Cloth  6 each Topical Daily   clonazePAM  1 mg  Per Tube Q8H   colchicine  0.6 mg Per Tube Daily   insulin aspart  0-15 Units Subcutaneous Q4H   insulin aspart  8 Units Subcutaneous Q4H   insulin detemir  45 Units Subcutaneous BID   metoCLOPramide (REGLAN) injection  10 mg Intravenous Q6H   mouth rinse  15 mL Mouth Rinse Q2H   pantoprazole (PROTONIX) IV  40 mg Intravenous QHS   polyethylene glycol  17 g Per Tube Daily   QUEtiapine  50 mg Per Tube BID   sodium chloride flush  10-40 mL Intracatheter Q12H   sodium chloride flush  3 mL Intravenous Q12H    Infusions:   prismasol BGK 4/2.5 400 mL/hr at 10/10/21 2213    prismasol BGK 4/2.5 400 mL/hr at 10/10/21 2213   sodium chloride 10 mL/hr at 10/11/21 0700   albumin human Stopped (10/08/21 1821)   amiodarone 60 mg/hr (10/11/21 0700)   epinephrine 3 mcg/min (10/11/21 0700)   feeding supplement (PIVOT 1.5 CAL) 65 mL/hr at 10/11/21 0700   HYDROmorphone 4 mg/hr (10/11/21 0700)   lactated ringers     lactated ringers Stopped (10/10/21 0952)   meropenem (MERREM) IV Stopped (10/11/21 0223)   midazolam 15 mg/hr (10/11/21 0700)   nitroGLYCERIN Stopped (10/07/21 1556)   norepinephrine (LEVOPHED) Adult infusion Stopped (10/10/21 0423)   prismasol BGK 4/2.5 1,500 mL/hr at 10/11/21 9476   sodium bicarbonate 25 mEq (Impella PURGE) in dextrose 5 % 1000 mL bag     vancomycin Stopped (10/10/21 2155)   vasopressin 0.01 Units/min (10/11/21 0700)    PRN Medications: sodium chloride, albumin human, artificial tears, dextrose, heparin, HYDROmorphone, midazolam, midazolam, ondansetron (ZOFRAN) IV, sodium chloride, sodium chloride flush, sodium chloride flush   Assessment/Plan:    1. Shock - mixed cardiogenic/hemorrhagic -> VA ECMO -> decannulated on 8/28 to Impella 5.5 - Echo 08/28: Underfilled LV, EF 55-60% with severe LVH and near normal RV systolic function.  - Impella remains at P4, Flow 2.  - On Epi 3 and  vaso 0.01. CO-OX 81 this am. Continue current drips. No plan to wean per Dr Cyndia Bent.   - CVP 12. Continues on CRRT to manage volume, currently pulling for negative 150/hr.  - Continue to remove volume with plan for possible chest closure Saturday.  .    2. Cardiac arrest (PEA/bradycardic) - 8/19 in setting of tamponade - Brief PEA arrest again on 08/28  3. Cardiac tamponade with emergent bedside sternotomy  - Diffuse epicardial bleeding with post-op Dresslers - return to OR 8/21 and 8/24 for washouts.  - Washout 8/28 in OR. Multiple units of blood products in OR.  - yesterday given 1UPRBCs.hgb 7.2>8    - CT/MT minimal blood output.   4. Acute hypoxemic respiratory failure - Off ECMO.  - Vent support per CCM  5. AKI due to ATN - CRRT started 08/28. 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 Continues on colchicine.  7. 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 ASA held as above  8. DM2 - continue SSI + levemir  9. PAF/AFL - Tolerates poorly.  - Back in AFL. S/p DCCV  to SR 08/29.  - SR today.   - On amio gtt at [place], continue  10. ID - On vancomycin/meropenem with open chest.   11. Neuro - Has followed commands with vent wean.  -  Requiring high dose sedation.   12. FEN - TFs ongoing   Plan to return to OR tomorrow.  Length of Stay: [place] NP-C  10/11/2021, 8:01 AM  Advanced Heart Failure Team Pager [phone] (M-F; 7a - 4p)  Please contact [facility] for night-coverage after hours (4p -7a ) and weekends on [email or web address]   Agree with above.   Remains intubated sedated. Will arouse and follow commands.  On epi 3  VP off. Pulling -150 on CRT. Co-ox 81%  Bleeding has stopped  Remains in NSR on IV amio   Impella 5.5 at P-4 with 2Lmin output. Waveforms ok. No alarms  General: Intubated/sedated. Will arouse and follow commands HEENT: normal + ETT Neck: supple. RIJ HD cath Carotids 2+ bilat; no bruits. No lymphadenopathy or thryomegaly  appreciated. Cor: Chest open esmarch dry  Regular rate & rhythm. Impella site ok  Lungs: clear Abdomen: soft, nontender, nondistended. No hepatosplenomegaly. No bruits or masses. Good bowel sounds. Extremities: no cyanosis, clubbing, rash, tr edema Neuro: Intubated/sedated. Will arouse and follow commands  Remains tenuous but improving. Continuing pulling on CRT. Can drop to -75 if CVP goes below 8. Continue Impella support. No AC. Possible chest closure tomorrow am.   CRITICAL CARE Performed by: Glori Bickers  Total critical care time: 40 minutes  Critical care time was exclusive of separately billable procedures and treating other patients.  Critical care was necessary to treat or prevent imminent or life-threatening deterioration.  Critical care was time spent personally by me (independent of midlevel providers or residents) on the following activities: development of treatment plan with patient and/or surrogate as well as nursing, discussions with consultants, evaluation of patient's response to treatment, examination of patient, obtaining history from patient or surrogate, ordering and performing treatments and interventions, ordering and review of laboratory studies, ordering and review of radiographic studies, pulse oximetry and re-evaluation of patient's condition.  Glori Bickers, MD  6:44 PM

## 2021-10-11 NOTE — Progress Notes (Signed)
Sputum sample obtained and walked to lab at this time.

## 2021-10-11 NOTE — Progress Notes (Signed)
Patient ID: [name], male   DOB: 1961/01/29, 61 y.o.   MRN: [id number]  TCTS Evening Rounds:  Hemodynamics stable today and vasopressin stopped due to hypertension to 170's.   CVP decreased to 8 today so volume removal slowed. -940 cc  this shift so far.  [name] on vent.  Plan sternal closure in am.

## 2021-10-11 NOTE — H&P (View-Only) (Signed)
Patient ID: [name], male   DOB: 01-02-1961, 61 y.o.   MRN: [id number]  TCTS Evening Rounds:  Hemodynamics stable today and vasopressin stopped due to hypertension to 170's.   CVP decreased to 8 today so volume removal slowed. -940 cc  this shift so far.  [name] on vent.  Plan sternal closure in am.

## 2021-10-11 NOTE — Progress Notes (Signed)
[facility] NEPHROLOGY PROGRESS NOTE  Assessment/ Plan: Pt is a 61 y.o. yo male  with history of HTN, HLD, DM, A-fib, stroke, CKD, CAD multivessel dz status post CABG on 8/3 presented with shortness of breath, found to have cardiac tamponade, course complicated by cardiac arrest, use of ECMO, seen as a consultation for the evaluation of AKI and fluid volume management.  #Acute kidney injury on CKD IIIa, nonoliguric: Ischemic ATN due to cardiogenic shock/cardiac arrest and multiple hemodynamic changes.  Started CRRT on 8/28 for decreasing urine output and fluid/volume management.   Tolerating UF around 150 cc an hour, still looks volume overload/anasarca therefore we will continue CRRT with UF.  All 4K bath.  Discussed with ICU nurse.   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  The echo was decannulated  with placement of Impella. Plan for sternal closure tomorrow by CT surgery.   #Cardiogenic shock/cardiac arrest: Was on ECMO before.  He is on multiple pressors including epinephrine, Levophed and vasopressin.  Followed closely by cardiology team.   #Acute hypoxic respiratory failure: Currently intubated and sedated.  Per pulmonary team.   #Acute blood loss anemia: Received blood transfusion.  Monitor hemoglobin and transfuse as needed by ICU team.  #Hypophosphatemia due to CRRT, replete sodium phosphate.  Monitor lab.   Subjective: The patient was seen and examined in ICU.  Tolerating CRRT well with ultrafiltration around 150 cc an hour.  Remains on pressors, sedated and intubated.  Urine output is recorded only 70 cc.  objective Vital signs in last 24 hours: Vitals:   10/11/21 1015 10/11/21 1030 10/11/21 1045 10/11/21 1100  BP:      Pulse: 70 68 66 65  Resp: (!) 23 20 (!) 27 20  Temp: 97.7 F (36.5 C) (!) 97.5 F (36.4 C) (!) 97.3 F (36.3 C) (!) 97.2 F (36.2 C)  TempSrc:      SpO2: 100% 100%  100% 100%  Weight:      Height:       Weight change: -1.6 kg  Intake/Output Summary (Last 24 hours) at 10/11/2021 1108 Last data filed at 10/11/2021 1100 Gross per 24 hour  Intake 4247.35 ml  Output 7253 ml  Net -3005.65 ml        Labs: RENAL PANEL Recent Labs    10/01/21 1616 10/01/21 1623 10/02/21 0354 10/02/21 0528 10/02/21 2124 10/03/21 0408 10/03/21 0414 10/03/21 0905 10/04/21 0256 10/04/21 0623 10/05/21 0441 10/05/21 0451 10/06/21 1600 10/06/21 1929 10/07/21 0408 10/07/21 0417 10/07/21 1730 10/07/21 1738 10/07/21 1743 10/07/21 1814 10/08/21 0350 10/08/21 0559 10/08/21 1513 10/08/21 1515 10/09/21 0410 10/09/21 0419 10/09/21 0808 10/09/21 1429 10/09/21 1531 10/10/21 0344 10/10/21 1535 10/11/21 0339  NA 141   < > 140   < > 142   < > 141   < > 144   < > 144   < > 144   < > 148*   < > 146*   < > 145   < > 139   < > 139 141 139 137 137 138 139 137 139 138   K 4.3   < > 4.2   < > 3.7   < > 4.2   < > 4.5   < > 3.6   < > 4.0   < > 3.8   < > 4.2   < > 4.6   < > 4.6   < > 4.5 4.6 4.1 5.5* 4.1 4.0 3.9 3.7 4.0 4.1  CL 108  --  109   < > 110  --  111   < > 110   < > 111   < > 114*   < > 115*   < > 111  --  109  --  107  --   --  104 100  --  103  --  106 105 107 105  CO2 28  --  26   < > 24  --  26   < > 28   < > 25   < > 25   < > 25  --  22  --   --   --  23  --   --  25 25  --  25  --  25 26 25 27   GLUCOSE 183*  --  177*   < > 162*  --  149*   < > 142*   < > 178*   < > 139*   < > 135*   < > 236*  --  274*  --  253*  --   --  295* 328*  --  329*  --  296* 281* 271* 202*  BUN 43*  --  47*   < > 51*  --  53*   < > 50*   < > 60*   < > 72*   < > 78*   < > 75*  --  70*  --  51*  --   --  39* 35*  --  33*  --  33* 33* 35* 32*  CREATININE 2.62*  --  2.77*   < > 2.71*  --  2.78*   < > 2.47*   < > 2.82*   < > 3.09*   < > 3.40*   < > 3.47*  --  3.60*  --  2.67*  --   --  2.34* 2.01*  --  1.94*  --  1.90* 1.91* 1.77* 1.79*  CALCIUM 7.4*  --  7.4*   < > 7.1*  --  7.3*   < > 7.2*    < > 7.3*   < > 7.9*   < > 8.2*  --  8.2*  --   --   --  7.8*  --   --  8.0* 8.2*  --  8.2*  --  7.8* 7.9* 7.8* 8.0*  MG 2.8*  --  2.7*  --  2.5*  --  2.6*  --  2.3  --   --   --   --   --   --   --   --   --   --   --  2.1  --   --  2.2 2.4  --   --   --   --  2.4  --  2.4  PHOS 4.3  --  4.2  --   --   --  3.6  --   --   --   --   --   --   --   --   --   --   --   --   --  4.8*  --   --  3.8 3.0  --   --   --  2.4* 2.3* 2.1* 1.9*  ALBUMIN  --   --  2.0*  --   --   --  2.0*  --  2.0*  --  2.0*  --  1.9*  --  1.9*  --   --   --   --   --  2.6*  --   --  2.5* 2.5*  --   --   --  2.3* 2.3* 2.5* 2.4*   < > = values in this interval not displayed.      Liver Function Tests: Recent Labs  Lab 10/05/21 0441 10/06/21 1600 10/07/21 0408 10/08/21 0350 10/10/21 0344 10/10/21 1535 10/11/21 0339  AST 28 99* 97*  --   --   --   --   ALT 11 10 15   --   --   --   --   ALKPHOS 37* 73 89  --   --   --   --   BILITOT 1.0 2.2* 1.1  --   --   --   --   PROT 3.7* 3.9* 4.1*  --   --   --   --   ALBUMIN 2.0* 1.9* 1.9*   < > 2.3* 2.5* 2.4*   < > = values in this interval not displayed.    No results for input(s): "LIPASE", "AMYLASE" in the last 168 hours. No results for input(s): "AMMONIA" in the last 168 hours. CBC: Recent Labs    10/09/21 0419 10/09/21 1335 10/09/21 1429 10/10/21 0344 10/11/21 0339  HGB 7.5* 7.4* 6.8* 7.2* 8.0*  MCV  --  92.2  --  93.8 94.8     Cardiac Enzymes: No results for input(s): "CKTOTAL", "CKMB", "CKMBINDEX", "TROPONINI" in the last 168 hours. CBG: Recent Labs  Lab 10/10/21 1117 10/10/21 1534 10/10/21 1959 10/10/21 2319 10/11/21 0354  GLUCAP 203* 259* 236* 237* 185*     Iron Studies: No results for input(s): "IRON", "TIBC", "TRANSFERRIN", "FERRITIN" in the last 72 hours. Studies/Results: DG CHEST PORT 1 VIEW  Result Date: 10/11/2021 CLINICAL DATA:  61 year old male status post insertion of Impella device on 10/07/2021 for cardiogenic shock, status post ECMO  and pericardial window last month. EXAM: PORTABLE CHEST 1 VIEW COMPARISON:  Portable chest 10/10/2021 and earlier. FINDINGS: Portable AP semi upright view at 0534 hours. Patient remains rotated to the left. Stable lines and tubes. Three left chest and/or mediastinal tubes remain in place. Impella device from right upper extremity approach appears stable. Increasing veiling left lower lung opacity and obscuration of the left mediastinal contours. No definite pneumothorax. Right lung appears negative. Paucity of bowel gas.  Stable visualized osseous structures. IMPRESSION: 1. More rotated to the left with increased veiling left lung base opacity, obscuration of the left mediastinal contours. No definite left pneumothorax. Right lung appears negative. 2. Extensive lines and tubes appear stable. Electronically Signed   By: Genevie Ann M.D.   On: 10/11/2021 08:19   DG CHEST PORT 1 VIEW  Result Date: 10/10/2021 CLINICAL DATA:  [id number]; ETT, chest tube present, status post Impella follow-up study EXAM: PORTABLE CHEST 1 VIEW COMPARISON:  Chest x-ray from yesterday FINDINGS: Again seen are the endotracheal tube, feeding tube, bilateral transjugular catheters, Impella catheter, left-sided chest tubes and presumed mediastinal drainage tube are stable. The tip of the enteric tube is off the field of view. Cardiopericardial silhouette is mildly enlarged, stable. There is some opacity seen at the left lung base, likely atelectasis, stable. Minor atelectasis at the right lung base, stable as well. The visualized skeletal structures are unremarkable. IMPRESSION: 1.  Life supporting apparatus are stable. 2. Atelectasis at the left lung base without significant interval change. Electronically Signed   By: Frazier Richards M.D.   On:  10/10/2021 08:12    Medications: Infusions:   prismasol BGK 4/2.5 400 mL/hr at 10/11/21 0924    prismasol BGK 4/2.5 400 mL/hr at 10/11/21 4628   sodium chloride 10 mL/hr at 10/11/21 1100   albumin  human Stopped (10/08/21 1821)   amiodarone 60 mg/hr (10/11/21 1100)   epinephrine 3 mcg/min (10/11/21 1100)   feeding supplement (PIVOT 1.5 CAL) 65 mL/hr at 10/11/21 1100   HYDROmorphone 4 mg/hr (10/11/21 1100)   lactated ringers     lactated ringers Stopped (10/10/21 0952)   meropenem (MERREM) IV Stopped (10/11/21 0935)   midazolam 20 mg/hr (10/11/21 1100)   nitroGLYCERIN Stopped (10/07/21 1556)   norepinephrine (LEVOPHED) Adult infusion Stopped (10/10/21 0423)   prismasol BGK 4/2.5 1,500 mL/hr at 10/11/21 0621   sodium bicarbonate 25 mEq (Impella PURGE) in dextrose 5 % 1000 mL bag     vancomycin Stopped (10/10/21 2155)   vasopressin Stopped (10/11/21 0905)    Scheduled Medications:  acetaminophen (TYLENOL) oral liquid 160 mg/5 mL  650 mg Per Tube Once   Or   acetaminophen  650 mg Rectal Once   atorvastatin  80 mg Per Tube Daily   Chlorhexidine Gluconate Cloth  6 each Topical Daily   clonazePAM  1 mg Per Tube Q8H   colchicine  0.6 mg Per Tube Daily   insulin aspart  0-15 Units Subcutaneous Q4H   insulin aspart  12 Units Subcutaneous Q4H   insulin detemir  45 Units Subcutaneous BID   metoCLOPramide (REGLAN) injection  10 mg Intravenous Q6H   mouth rinse  15 mL Mouth Rinse Q2H   pantoprazole (PROTONIX) IV  40 mg Intravenous QHS   polyethylene glycol  17 g Per Tube Daily   QUEtiapine  50 mg Per Tube BID   sodium chloride flush  10-40 mL Intracatheter Q12H   sodium chloride flush  3 mL Intravenous Q12H    have reviewed scheduled and prn medications.  Physical Exam: General: Critically ill looking male, intubated, sedated, not responding and has multiple lines for infusion. Heart:RRR, s1s2 nl Lungs: Multiple chest tubes and lines present. Abdomen: Abdomen is wall is edematous, mild distention present. Extremities: Both upper and lower extremities has pitting edema and anasarca ++ Dialysis Access: Left groin temporary HD catheter was placed by PCCM on 8/28, site looks  clean.  Tiersa Dayley Tanna Furry 10/11/2021,11:08 AM  LOS: 13 days

## 2021-10-11 NOTE — Progress Notes (Signed)
NAME:  [name], MRN:  [id number], DOB:  1960-05-11, LOS: 24 ADMISSION DATE:  09/28/2021, CONSULTATION DATE:  09/29/21 REFERRING MD:  Dr. Kipp Brood, CHIEF COMPLAINT:  Cardiac Arrest   History of Present Illness:  [name] is a 61 year old male with DMII, , CVA, atrial fibrillation on eliquis and multivessel CAD s/p 5 vessel CABG on 09/12/21 who presented 8/19 with shortness of breath via EMS. He was found to be bradycardic and hypoglycemic. He symptomatically improved with dextrose but continued to have bradycardia and soft blood pressures. ECHO obtained which showed LVEF 50%, grade II diastolic dysfunction and moderate pericardial effusion with possible tamponade physiology. CT Chest showed pericardial effusion and small bilateral pleural effusions. He was taken to the OR 8/19 for pericardial window.   Patient started having issues with tachy-brady arrhythmias on telemetry, decreased urine output and increased work of breathing over night. A 14 french pigtail catheter was placed in the left pleural space this morning with bloody drainage. At 10:35am patient became unresponsive with PEA arrest. He underwent multiple rounds of CPR with epinephrine pushes. ROSC was obtained and patient was intubated. Bedside US by cardiology showed large pericardial effusion with tamponade physiology. He lost pulse again and CPR was again performed. Please see code sheet for full details. Bedside sternotomy performed with pericardial drainage by CT surgery, immediate improvement in hemodynamics achieved with drainage of the pericardial space.   Pertinent  Medical History   Past Medical History:  Diagnosis Date   AKI (acute kidney injury) ([place])    pt unaware of this   Anginal pain ([place])    CAD (coronary artery disease)    a. 03/2015 NSTEMI: LHC with severe 3V CAD  (70% mid RCA, 95% OM1, 90% distal LCx, 90% OM3, 80% prox LAD and 90% ost D1) s/p DES to mLAD w/ small dissction Rx with DES, staged ost Ramus PCI/DES and  dLCx s/p PCI/DES    Chest pain 12/24/2020   Diabetes mellitus type 2 in obese [facility])    Diverticulosis    Dyspnea    Dyspnea on exertion 03/16/2015   Dyspnea on exertion   Family history of adverse reaction to anesthesia    patient father- pt states after anesthesia his father "developed dementia"   GERD (gastroesophageal reflux disease)    Hypercholesteremia    Hypertension associated with diabetes ([place]) 03/16/2015   hypertension   NSTEMI (non-ST elevated myocardial infarction) ([place]) 03/17/2015   Obesity    Stroke ([place]) 2022   pt states he had a "mini stroke" during cardiac catheterization   Tobacco abuse    Significant Hospital Events: Including procedures, antibiotic start and stop dates in addition to other pertinent events   8/19 admitted, s/p pericardial window 8/20 PEA cardiac arrest, left pigtail chest tube placement, PEA cardiac arrest due to tamponade, bedside sternotomy with pericardial drains placed. 8/21 return to OR for overnight bleeding.  8/22 minimal chest tube output.  Atrial fibrillation, controled with amiodarone.  8/22 TEE showed EF 35% at baseline, which improved significantly with decreasing ECMO flow.  8/23 tolerated diuresis  8/24 mediastinal washout, removal of hematoma 8/25 ongoing bleeding issues from posterior mediastinum 8/29 decannulated and off ECMO, 5.5 Impella placed, L femoral HD cath placed and started on CRRT 8/30 hypoxia and hypotension followed by brief arrest, gentle L lateral chest compressions performed and ROSC in roughly 1 minute. 8/31 remains deeply sedated on ventilator with open chest.  No acute events overnight 9/1 no acute events overnight, remains on  ventilator deep sedation.  Tolerating aggressive volume removal per CRRT  Interim History / Subjective:  Remains deeply sedated on ventilator but will awaken to physical stimuli Tolerating CRRT with 3.1 L removed over the last 24 hours  Objective   Blood pressure 139/62, pulse 67,  temperature 99 F (37.2 C), resp. rate 20, height 5\' 7"  (1.702 m), weight 106.4 kg, SpO2 100 %. CVP:  [11 mmHg-18 mmHg] 15 mmHg  Vent Mode: PRVC FiO2 (%):  [50 %] 50 % Set Rate:  [20 bmp] 20 bmp Vt Set:  [530 mL] 530 mL PEEP:  [8 cmH20] 8 cmH20 Plateau Pressure:  [23 cmH20-25 cmH20] 24 cmH20   Intake/Output Summary (Last 24 hours) at 10/11/2021 0715 Last data filed at 10/11/2021 0700 Gross per 24 hour  Intake 4522.7 ml  Output 7653 ml  Net -3130.3 ml    Filed Weights   10/09/21 0440 10/10/21 0500 10/11/21 0432  Weight: 105.1 kg 108 kg 106.4 kg   Examination: General: Acute on chronic ill-appearing middle-age male lying in bed on mechanical ventilation in no acute distress HEENT: ETT, MM pink/moist, PERRL,  Neuro: Sedated on ventilator, will open eyes to follow simple commands with physical stimuli CV: s1s2 regular rate and rhythm, no murmur, rubs, or gallops,  PULM: Clear to auscultation bilaterally, no increased work of breathing, no added breath sounds GI: soft, bowel sounds active in all 4 quadrants, non-tender, non-distended, tolerating TF Extremities: warm/dry, generalized edema  Skin: no rashes or lesions  Assessment & Plan:   Hemorraghic pericarditis w/ cardiac tamponade/obstructive shock causing PEA arrest s/p pericardial drain placement with subsequent cardiac stunning on VA ECMO  -Weaned off VA ECMO, impella 5.5 placed 8/28  in OR with pericardial washout and ECMO decannulation -Return to OR 8/21 and 8/24 for chest washout, and to return to the OR 9/2 for additional washout and possible closure of chest Coronary artery disease s/p CABG x 5 8/3 Paroxysmal atrial fibrillation/a.fluter Acute RV failure with cardiogenic & distributive shock -Echocardiogram 8/26 with underfilled LVEF 55 to be 60% severe LVH and near normal RV systolic function P: Primary management per heart failure and TCTS Impella per heart failure Continue vasopressors and inotropes per  TCTS Anticoagulation resumption per TCTS Remains on IV amiodarone Deep sedation while chest open Tentative plan for sternal closure 9/2  Acute hypoxemic and hypercapnic respiratory failure -In the setting of above P: Continue ventilator support with lung protective strategies  Wean PEEP and FiO2 for sats greater than 90%. Head of bed elevated 30 degrees. Plateau pressures less than 30 cm H20.  Follow intermittent chest x-ray and ABG.   SAT/SBT as tolerated, mentation preclude extubation  Ensure adequate pulmonary hygiene  Follow cultures  VAP bundle in place  PAD protocol  Acute metabolic encephalopathy due  -Secondary to sedation; has previously been able to follow commands but was agitated. P: Maintain neuro protective measures; goal for eurothermia, euglycemia, eunatermia, normoxia, and PCO2 goal of 35-40 Nutrition and bowel regiment  Seizure precautions  AEDs per neurology  Aspirations precautions  Try to wean sedation as able but currently with open chest this is limited  Sepsis due to pericarditis/Dresslers P: Continue vancomycin and meropenem while chest open Monitor fever curve and WBC  Acute Kidney 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 Hypervolemia Hypocalcemia - s/p repletion 2g Ca gluconate P: Follow renal function  Monitor urine output Trend Bmet Avoid nephrotoxins Ensure adequate renal perfusion   Acute blood loss anemia -Ongoing issue with any AC challenge -Transfuse 1 unit PRBC 8/31  with stable hemoglobin 8.0 P: Trend CBC Transfuse per protocol Hemoglobin goal greater than 8  Hx of DMII P: Continue SSI, Levemir, and tube feed coverage CBG goal 140-180  Intolerance to TF P: Continue tube feeds  Best Practice (right click and "Reselect all SmartList Selections" daily)   Diet/type: TFs DVT prophylaxis: see discussion above GI prophylaxis: PPI Lines: Central line and yes and it is still needed Foley:  Yes, and it is still needed Code Status:  full  code Last date of multidisciplinary goals of care discussion [Per primary]  Critical care: 38 min  Soffia Doshier D. Kenton Kingfisher, NP-C [facility] Personal contact information can be found on Amion  10/11/2021, 7:15 AM

## 2021-10-11 NOTE — Anesthesia Preprocedure Evaluation (Addendum)
Anesthesia Evaluation  Patient identified by MRN, date of birth, ID band Patient awake    Reviewed: Allergy & Precautions, H&P , NPO status , Patient's Chart, lab work & pertinent test results  History of Anesthesia Complications (+) Family history of anesthesia reaction  Airway Mallampati: Intubated       Dental no notable dental hx. (+) Teeth Intact, Dental Advisory Given   Pulmonary neg pulmonary ROS, Patient abstained from smoking., former smoker,    Pulmonary exam normal breath sounds clear to auscultation       Cardiovascular Exercise Tolerance: Good hypertension, Pt. on medications and Pt. on home beta blockers + CAD, + Past MI, + CABG and +CHF   Rhythm:Regular Rate:Normal     Neuro/Psych CVA negative psych ROS   GI/Hepatic Neg liver ROS, GERD  Medicated,  Endo/Other  diabetes, Insulin DependentMorbid obesity  Renal/GU negative Renal ROS  negative genitourinary   Musculoskeletal   Abdominal   Peds  Hematology negative hematology ROS (+)   Anesthesia Other Findings   Reproductive/Obstetrics negative OB ROS                           Anesthesia Physical Anesthesia Plan  ASA: 4  Anesthesia Plan: General   Post-op Pain Management:    Induction: Intravenous  PONV Risk Score and Plan: 2 and Treatment may vary due to age or medical condition  Airway Management Planned: Oral ETT  Additional Equipment: TEE  Intra-op Plan:   Post-operative Plan: Post-operative intubation/ventilation  Informed Consent: I have reviewed the patients History and Physical, chart, labs and discussed the procedure including the risks, benefits and alternatives for the proposed anesthesia with the patient or authorized representative who has indicated his/her understanding and acceptance.     Dental advisory given  Plan Discussed with: CRNA  Anesthesia Plan Comments:       Anesthesia Quick  Evaluation

## 2021-10-11 NOTE — Progress Notes (Signed)
4 Days Post-Op Procedure(s) (LRB): INSERTION OF RIGHT AXILLARY IMPELLA, DECANNULATION OF ECMO, AND MEDIASTINAL WASHOUT (N/A) TRANSESOPHAGEAL ECHOCARDIOGRAM (TEE) Subjective:  Hemodynamically stable overnight. Epi 3, vaso 0.01.  MAP 70's-80's. CVP 14-15 Co-ox 81%  Maintaining sinus 60's to 70's on IV amio  -3130 cc/24 hrs on CRRT  Objective: Vital signs in last 24 hours: Temp:  [96.3 F (35.7 C)-99.3 F (37.4 C)] 98.6 F (37 C) (09/01 0800) Pulse Rate:  [61-78] 74 (09/01 0800) Cardiac Rhythm: Normal sinus rhythm (09/01 0800) Resp:  [18-24] 20 (09/01 0800) BP: (116-139)/(55-62) 139/62 (08/31 1543) SpO2:  [100 %] 100 % (09/01 0800) Arterial Line BP: (113-178)/(53-79) 173/70 (09/01 0800) FiO2 (%):  [50 %] 50 % (09/01 0800) Weight:  [106.4 kg] 106.4 kg (09/01 0432)  Hemodynamic parameters for last 24 hours: CVP:  [11 mmHg-18 mmHg] 15 mmHg  Intake/Output from previous day: 08/31 0701 - 09/01 0700 In: 4522.7 [I.V.:1789.2; Blood:325; NG/GT:1692.3; IV Piggyback:500.2] Out: 1017 [Urine:70; Stool:526; Chest Tube:120] Intake/Output this shift: Total I/O In: 154.3 [I.V.:89.3; NG/GT:65] Out: 20 [Urine:10; Chest Tube:10]  General appearance: anasarca continues to improve.  Neurologic: opening eyes spontaneously. Not following commands this am but reportedly did yesterday. Still on high dose sedation. Heart: regular rate and rhythm, S1, S2 normal, no murmur Lungs: clear to auscultation bilaterally Abdomen: soft, non-tender; bowel sounds normal Extremities: extremities warm Wound: chest dressing intact.  Lab Results: Recent Labs    10/10/21 0344 10/11/21 0339  WBC 14.3* 15.3*  HGB 7.2* 8.0*  HCT 22.5* 25.6*  PLT 187 233   BMET:  Recent Labs    10/10/21 1535 10/11/21 0339  NA 139 138  K 4.0 4.1  CL 107 105  CO2 25 27  GLUCOSE 271* 202*  BUN 35* 32*  CREATININE 1.77* 1.79*  CALCIUM 7.8* 8.0*    PT/INR:  Recent Labs    10/11/21 0339  LABPROT 13.6  INR 1.1    ABG    Component Value Date/Time   PHART 7.389 10/09/2021 0419   HCO3 26.7 10/09/2021 1429   TCO2 28 10/09/2021 1429   ACIDBASEDEF 2.0 10/07/2021 1814   O2SAT 81.3 10/11/2021 0339   CBG (last 3)  Recent Labs    10/10/21 1959 10/10/21 2319 10/11/21 0354  GLUCAP 236* 237* 185*   CXR: stable  Assessment/Plan: S/P Procedure(s) (LRB): INSERTION OF RIGHT AXILLARY IMPELLA, DECANNULATION OF ECMO, AND MEDIASTINAL WASHOUT (N/A) TRANSESOPHAGEAL ECHOCARDIOGRAM (TEE)  He remains hemodynamically stable on low dose pressors and Impella at P4.  Removing volume well with CRRT.  Continue IV amio for PAF/flutter. Maintaining sinus which has helped hemodynamics.  Discussed heparin with DB yesterday and decided to hold off since his is sinus and bleeding on Eliquis got him into trouble to start with.   Plan sternal closure tomorrow am.   LOS: 13 days    [name] 10/11/2021

## 2021-10-11 NOTE — Progress Notes (Signed)
Per Bensimhon MD, if patient's CVP drops to 8 or below, decrease UF to 75 mL/hr. Patient's CVP currently 7-8. Will decrease UF on CRRT.

## 2021-10-11 NOTE — Progress Notes (Signed)
Attempted to get sputum sample, however, patient has no secretions at this time. Will attempt again later.

## 2021-10-12 ENCOUNTER — Inpatient Hospital Stay (HOSPITAL_COMMUNITY): Payer: Self-pay

## 2021-10-12 ENCOUNTER — Other Ambulatory Visit: Payer: Self-pay

## 2021-10-12 ENCOUNTER — Inpatient Hospital Stay (HOSPITAL_COMMUNITY): Payer: Self-pay | Admitting: Anesthesiology

## 2021-10-12 ENCOUNTER — Encounter (HOSPITAL_COMMUNITY)
Admission: EM | Disposition: A | Discharge status: Skilled Nursing Facility | Payer: Self-pay | Source: Home / Self Care | Attending: Surgery

## 2021-10-12 DIAGNOSIS — I509 Heart failure, unspecified: Secondary | ICD-10-CM

## 2021-10-12 DIAGNOSIS — I97631 Postprocedural hematoma of a circulatory system organ or structure following cardiac bypass: Secondary | ICD-10-CM

## 2021-10-12 DIAGNOSIS — Z87891 Personal history of nicotine dependence: Secondary | ICD-10-CM

## 2021-10-12 DIAGNOSIS — I2510 Atherosclerotic heart disease of native coronary artery without angina pectoris: Secondary | ICD-10-CM

## 2021-10-12 DIAGNOSIS — S21109A Unspecified open wound of unspecified front wall of thorax without penetration into thoracic cavity, initial encounter: Secondary | ICD-10-CM

## 2021-10-12 DIAGNOSIS — I110 Hypertensive heart disease with heart failure: Secondary | ICD-10-CM

## 2021-10-12 DIAGNOSIS — R570 Cardiogenic shock: Secondary | ICD-10-CM

## 2021-10-12 DIAGNOSIS — I252 Old myocardial infarction: Secondary | ICD-10-CM

## 2021-10-12 HISTORY — PX: MEDIASTINAL EXPLORATION: SHX5065

## 2021-10-12 HISTORY — PX: STERNAL CLOSURE: SHX6203

## 2021-10-12 HISTORY — PX: TEE WITHOUT CARDIOVERSION: SHX5443

## 2021-10-12 LAB — RENAL FUNCTION PANEL
Albumin: 2.2 g/dL — ABNORMAL LOW (ref 3.5–5.0)
Albumin: 2.2 g/dL — ABNORMAL LOW (ref 3.5–5.0)
Anion gap: 7 (ref 5–15)
Anion gap: 7 (ref 5–15)
BUN: 27 mg/dL — ABNORMAL HIGH (ref 8–23)
BUN: 31 mg/dL — ABNORMAL HIGH (ref 8–23)
CO2: 27 mmol/L (ref 22–32)
CO2: 22 mmol/L (ref 22–32)
Calcium: 8.3 mg/dL — ABNORMAL LOW (ref 8.9–10.3)
Calcium: 7.7 mg/dL — ABNORMAL LOW (ref 8.9–10.3)
Chloride: 104 mmol/L (ref 98–111)
Chloride: 105 mmol/L (ref 98–111)
Creatinine, Ser: 1.88 mg/dL — ABNORMAL HIGH (ref 0.61–1.24)
Creatinine, Ser: 2.11 mg/dL — ABNORMAL HIGH (ref 0.61–1.24)
GFR, Estimated: 40 mL/min — ABNORMAL LOW (ref >60)
GFR, Estimated: 35 mL/min — ABNORMAL LOW (ref >60)
Glucose, Bld: 91 mg/dL (ref 70–99)
Glucose, Bld: 231 mg/dL — ABNORMAL HIGH (ref 70–99)
Phosphorus: 2.7 mg/dL (ref 2.5–4.6)
Phosphorus: 3.6 mg/dL (ref 2.5–4.6)
Potassium: 4.2 mmol/L (ref 3.5–5.1)
Potassium: 4.9 mmol/L (ref 3.5–5.1)
Sodium: 138 mmol/L (ref 135–145)
Sodium: 134 mmol/L — ABNORMAL LOW (ref 135–145)

## 2021-10-12 LAB — CBC
HCT: 25 % — ABNORMAL LOW (ref 39.0–52.0)
Hemoglobin: 7.9 g/dL — ABNORMAL LOW (ref 13.0–17.0)
MCH: 30 pg (ref 26.0–34.0)
MCHC: 31.6 g/dL (ref 30.0–36.0)
MCV: 95.1 fL (ref 80.0–100.0)
Platelets: 282 10*3/uL (ref 150–400)
RBC: 2.63 MIL/uL — ABNORMAL LOW (ref 4.22–5.81)
RDW: 16.9 % — ABNORMAL HIGH (ref 11.5–15.5)
WBC: 14.4 10*3/uL — ABNORMAL HIGH (ref 4.0–10.5)
nRBC: 0.1 % (ref 0.0–0.2)

## 2021-10-12 LAB — COOXEMETRY PANEL
Carboxyhemoglobin: 1.9 % — ABNORMAL HIGH (ref 0.5–1.5)
Methemoglobin: <0.7 % (ref 0.0–1.5)
O2 Saturation: 73.4 %
Total hemoglobin: 7.8 g/dL — ABNORMAL LOW (ref 12.0–16.0)

## 2021-10-12 LAB — ECHO INTRAOPERATIVE TEE
Height: 67 in
Weight: 3760.17 oz

## 2021-10-12 LAB — PROTIME-INR
INR: 1.1 (ref 0.8–1.2)
Prothrombin Time: 14.3 seconds (ref 11.4–15.2)

## 2021-10-12 LAB — GLUCOSE, CAPILLARY
Glucose-Capillary: 112 mg/dL — ABNORMAL HIGH (ref 70–99)
Glucose-Capillary: 209 mg/dL — ABNORMAL HIGH (ref 70–99)
Glucose-Capillary: 221 mg/dL — ABNORMAL HIGH (ref 70–99)
Glucose-Capillary: 234 mg/dL — ABNORMAL HIGH (ref 70–99)
Glucose-Capillary: 85 mg/dL (ref 70–99)

## 2021-10-12 LAB — MAGNESIUM: Magnesium: 2.5 mg/dL — ABNORMAL HIGH (ref 1.7–2.4)

## 2021-10-12 LAB — PREPARE RBC (CROSSMATCH)

## 2021-10-12 LAB — FIBRINOGEN: Fibrinogen: 607 mg/dL — ABNORMAL HIGH (ref 210–475)

## 2021-10-12 LAB — LACTATE DEHYDROGENASE: LDH: 284 U/L — ABNORMAL HIGH (ref 98–192)

## 2021-10-12 SURGERY — EXPLORATION, MEDIASTINUM
Anesthesia: General

## 2021-10-12 MED ORDER — MIDAZOLAM HCL 5 MG/5ML IJ SOLN
INTRAMUSCULAR | Status: DC | PRN
  Administered 2021-10-12: 2 mg via INTRAVENOUS

## 2021-10-12 MED ORDER — PHENYLEPHRINE 80 MCG/ML (10ML) SYRINGE FOR IV PUSH (FOR BLOOD PRESSURE SUPPORT)
PREFILLED_SYRINGE | INTRAVENOUS | Status: DC | PRN
  Administered 2021-10-12: 40 ug via INTRAVENOUS
  Administered 2021-10-12 (×2): 80 ug via INTRAVENOUS
  Administered 2021-10-12: 40 ug via INTRAVENOUS

## 2021-10-12 MED ORDER — LACTATED RINGERS IV SOLN: INTRAVENOUS | Status: DC | PRN

## 2021-10-12 MED ORDER — PROPOFOL 10 MG/ML IV BOLUS
INTRAVENOUS | Status: AC
  Filled 2021-10-12: qty 20

## 2021-10-12 MED ORDER — ROCURONIUM BROMIDE 10 MG/ML (PF) SYRINGE
PREFILLED_SYRINGE | INTRAVENOUS | Status: DC | PRN
  Administered 2021-10-12: 30 mg via INTRAVENOUS
  Administered 2021-10-12 (×2): 50 mg via INTRAVENOUS

## 2021-10-12 MED ORDER — ROCURONIUM BROMIDE 10 MG/ML (PF) SYRINGE
PREFILLED_SYRINGE | INTRAVENOUS | Status: AC
  Filled 2021-10-12: qty 20

## 2021-10-12 MED ORDER — 0.9 % SODIUM CHLORIDE (POUR BTL) OPTIME
TOPICAL | Status: DC | PRN
  Administered 2021-10-12: 3000 mL

## 2021-10-12 MED ORDER — FENTANYL CITRATE (PF) 250 MCG/5ML IJ SOLN
INTRAMUSCULAR | Status: DC | PRN
  Administered 2021-10-12: 50 ug via INTRAVENOUS
  Administered 2021-10-12: 100 ug via INTRAVENOUS

## 2021-10-12 MED ORDER — THROMBIN 20000 UNITS EX SOLR
OROMUCOSAL | Status: DC | PRN
  Administered 2021-10-12: 8 mL via TOPICAL

## 2021-10-12 MED ORDER — THROMBIN (RECOMBINANT) 20000 UNITS EX SOLR
CUTANEOUS | Status: AC
  Filled 2021-10-12: qty 20000

## 2021-10-12 MED ORDER — MIDAZOLAM HCL 2 MG/2ML IJ SOLN
INTRAMUSCULAR | Status: AC
  Filled 2021-10-12: qty 2

## 2021-10-12 MED ORDER — FENTANYL CITRATE (PF) 250 MCG/5ML IJ SOLN
INTRAMUSCULAR | Status: AC
  Filled 2021-10-12: qty 5

## 2021-10-12 MED ORDER — SODIUM CHLORIDE 0.9 % IV SOLN: INTRAVENOUS | Status: DC | PRN

## 2021-10-12 SURGICAL SUPPLY — 49 items
BLADE CLIPPER SURG (BLADE) ×1 IMPLANT
BLADE SURG 15 STRL LF DISP TIS (BLADE) IMPLANT
BLADE SURG 15 STRL SS (BLADE) ×1
CANISTER SUCT 3000ML PPV (MISCELLANEOUS) ×1 IMPLANT
CANISTER WOUND CARE 500ML ATS (WOUND CARE) IMPLANT
CATH ROBINSON RED A/P 18FR (CATHETERS) IMPLANT
CATH THORACIC 36FR (CATHETERS) IMPLANT
CLIP TI MEDIUM 6 (CLIP) IMPLANT
CLIP TI WIDE RED SMALL 6 (CLIP) IMPLANT
COVER SURGICAL LIGHT HANDLE (MISCELLANEOUS) ×1 IMPLANT
DRAIN JP 15F RND TROCAR (DRAIN) IMPLANT
DRAPE CV SPLIT W-CLR ANES SCRN (DRAPES) IMPLANT
DRAPE PERI GROIN 82X75IN TIB (DRAPES) IMPLANT
DRESSING PEEL AND PLAC PRVNA20 (GAUZE/BANDAGES/DRESSINGS) IMPLANT
DRSG PEEL AND PLACE PREVENA 20 (GAUZE/BANDAGES/DRESSINGS) ×1
DRSG TEGADERM 4X4.5 CHG (GAUZE/BANDAGES/DRESSINGS) IMPLANT
ELECT REM PT RETURN 9FT ADLT (ELECTROSURGICAL) ×1
ELECTRODE REM PT RTRN 9FT ADLT (ELECTROSURGICAL) ×1 IMPLANT
EVACUATOR SILICONE 100CC (DRAIN) IMPLANT
GAUZE SPONGE 4X4 12PLY STRL (GAUZE/BANDAGES/DRESSINGS) ×1 IMPLANT
GLOVE SURG MICRO LTX SZ7 (GLOVE) ×1 IMPLANT
GOWN STRL REUS W/ TWL LRG LVL3 (GOWN DISPOSABLE) ×2 IMPLANT
GOWN STRL REUS W/ TWL XL LVL3 (GOWN DISPOSABLE) ×1 IMPLANT
GOWN STRL REUS W/TWL LRG LVL3 (GOWN DISPOSABLE) ×2
GOWN STRL REUS W/TWL XL LVL3 (GOWN DISPOSABLE) ×1
HEMOSTAT POWDER SURGIFOAM 1G (HEMOSTASIS) IMPLANT
KIT BASIN OR (CUSTOM PROCEDURE TRAY) ×1 IMPLANT
KIT SUCTION CATH 14FR (SUCTIONS) IMPLANT
KIT TURNOVER KIT B (KITS) ×1 IMPLANT
NS IRRIG 1000ML POUR BTL (IV SOLUTION) ×1 IMPLANT
PACK CHEST (CUSTOM PROCEDURE TRAY) IMPLANT
PAD ARMBOARD 7.5X6 YLW CONV (MISCELLANEOUS) ×2 IMPLANT
SUT ETHIBOND NAB MH 2-0 36IN (SUTURE) IMPLANT
SUT ETHILON 3 0 FSL (SUTURE) IMPLANT
SUT PROLENE 1 CTX 30  8455H (SUTURE) ×8
SUT PROLENE 1 CTX 30 8455H (SUTURE) IMPLANT
SUT PROLENE 1 XLH 60 (SUTURE) IMPLANT
SUT SILK  1 MH (SUTURE) ×5
SUT SILK 1 MH (SUTURE) IMPLANT
SUT SILK 2 0 SH CR/8 (SUTURE) IMPLANT
SUT SILK 3 0 SH CR/8 (SUTURE) IMPLANT
SUT STEEL 6MS V (SUTURE) IMPLANT
SUT STEEL SZ 6 DBL 3X14 BALL (SUTURE) IMPLANT
SUT VIC AB 2-0 CTX 27 (SUTURE) IMPLANT
SUT VIC AB 3-0 X1 27 (SUTURE) IMPLANT
SYSTEM SAHARA CHEST DRAIN ATS (WOUND CARE) IMPLANT
TOWEL GREEN STERILE (TOWEL DISPOSABLE) ×1 IMPLANT
TOWEL GREEN STERILE FF (TOWEL DISPOSABLE) ×1 IMPLANT
WATER STERILE IRR 1000ML POUR (IV SOLUTION) ×1 IMPLANT

## 2021-10-12 NOTE — Anesthesia Postprocedure Evaluation (Signed)
Anesthesia Post Note  Patient: [name]  Procedure(s) Performed: MEDIASTINAL WASHOUT STERNAL CLOSURE TRANSESOPHAGEAL ECHOCARDIOGRAM (TEE)     Patient location during evaluation: SICU Anesthesia Type: General Level of consciousness: sedated Pain management: pain level controlled Vital Signs Assessment: post-procedure vital signs reviewed and stable Respiratory status: patient remains intubated per anesthesia plan Cardiovascular status: stable Postop Assessment: no apparent nausea or vomiting Anesthetic complications: no   No notable events documented.  Last Vitals:  Vitals:   10/12/21 0400 10/12/21 0500  BP:    Pulse: 62 62  Resp: 20 20  Temp: 36.9 C 36.8 C  SpO2: 100% 100%    Last Pain:  Vitals:   10/11/21 1600  TempSrc: Bladder  PainSc:                  Lizett Chowning,W. EDMOND

## 2021-10-12 NOTE — Transfer of Care (Signed)
Immediate Anesthesia Transfer of Care Note  Patient: [name]  Procedure(s) Performed: MEDIASTINAL WASHOUT STERNAL CLOSURE TRANSESOPHAGEAL ECHOCARDIOGRAM (TEE)  Patient Location: ICU  Anesthesia Type:General  Level of Consciousness: sedated and Patient remains intubated per anesthesia plan  Airway & Oxygen Therapy: Patient remains intubated per anesthesia plan and Patient placed on Ventilator (see vital sign flow sheet for setting)  Post-op Assessment: Report given to RN and Post -op Vital signs reviewed and stable  Post vital signs: Reviewed and stable. See ICU VSS flowsheets  Last Vitals:  Vitals Value Taken Time  BP    Temp    Pulse 71 10/12/21 1039  Resp 20 10/12/21 1039  SpO2 100 % 10/12/21 1039  Vitals shown include unvalidated device data.  Last Pain:  Vitals:   10/11/21 1600  TempSrc: Bladder  PainSc:          Complications: No notable events documented.

## 2021-10-12 NOTE — Progress Notes (Signed)
Pt taken off ventilator and manually ventilated to OR by CRNA

## 2021-10-12 NOTE — Op Note (Signed)
CARDIOVASCULAR SURGERY OPERATIVE NOTE  10/12/2021  Surgeon:  [name], MD  First Assistant: RNFA  Preoperative Diagnosis:  Open sternum s/p decannulation from ECMO    Postoperative Diagnosis:  Same   Procedure:   Mediastinal washout 2.    Closure of sternum   Anesthesia:  General Endotracheal   Clinical History/Surgical Indication:  The patient is a 61 year old gentleman who underwent coronary artery bypass graft surgery x5 on 09/12/2021.  His postoperative course was complicated by atrial fibrillation that was converted with amiodarone.  He was discharged home on Eliquis.  He also has stage IIIb chronic kidney disease with a creatinine of around 1.8 which increased slightly postoperatively and prolonged how long it took to diurese him back to his baseline weight.  He continued to improve and was discharged home on 09/20/2021.  He reported that he initially felt well when he went home but then began having lethargy and dyspnea on exertion and he presented to the emergency room on 09/28/2021 after he woke up with acute shortness of breath and diaphoresis.  A CT scan of the chest showed cardiomegaly with small pericardial effusion.  There were small bilateral pleural effusions with bibasilar atelectasis.  A 2D echocardiogram on 09/28/2021 showed a left ventricular ejection fraction of 50% with grade 2 diastolic dysfunction.  There was felt to be a moderate size pericardial effusion that was circumferential.  There was a question of possible tamponade.  The patient was taken to the operating room by Dr. Kipp Brood on 09/28/2021 and underwent right VATS for pericardial window.  It was noted that there was significant clot upon entry into the pericardium as well as some serosanguineous fluid.  He was taken back to the intensive care unit but remained hemodynamically unstable and became unresponsive and lost  his blood pressure requiring CPR.  After he regained his pulse a bedside echo showed a large posterior clot and he underwent mediastinal exploration by Dr. Kipp Brood.  The clot was evacuated with improvement in his hemodynamics.  The sternum was not closed.  He continued to have arrhythmias and cardiogenic shock and was taken back to the operating room for mediastinal reexploration and institution of VA ECMO by Dr. Kipp Brood.  This was performed using central aortic cannulation and a 23/25 French multistage cannula in the right femoral vein.  He has since required two mediastinal explorations for bleeding and washout requiring large amounts of clotting factors. No specific bleeding sites were found.  He was on ECMO for 8 days and was taken back to the OR for insertion of an Impella 5.5 by the right axillary artery to provide hemodynamic support after discontinuation of ECMO.  He has done well since that procedure with weaning of inotropes and vasopressors and removal of a large amount of fluid with CRRT. He is very stable on Epi 3 mcg with the Impella at [facility]. It is felt that he is ready for sternal closure. The operative procedure was discussed with the patient's wife including alternatives, benefits, and risks including but not limited to bleeding, blood transfusion, infection, stroke, myocardial infarction, organ dysfunction, and death.  She understood and agreed to proceed.    Preparation:  The consent was signed by me. Preoperative antibiotics were given. The patient was taken directly to the operating room and positioned supine on the operating room table. After being placed under general endotracheal anesthesia by the anesthesia team the neck, chest, and abdomen were prepped with betadine soap and solution and draped in the usual sterile  manner. The chest tubes were removed before prepping. A surgical time-out was taken and the correct patient and operative procedure were confirmed with the nursing and  anesthesia staff.   *INTRAOPERATIVE TRANSESOPHAGEAL REPORT *     Patient Name:   [name] [facility]   Date of Exam: 10/12/2021 Medical Rec #:  [id number]      Height:       67.0 in Accession #:    [id number]     Weight:       235.0 lb Date of Birth:  01-15-1961      BSA:          2.17 m Patient Age:    61 years       BP:           122/56 mmHg Patient Gender: M              HR:           63 bpm. Exam Location:  Anesthesiology  Transesophogeal exam was perform intraoperatively during surgical procedure. Patient was closely monitored under general anesthesia during the entirety of examination.  Indications:     Open Chest Performing Phys: [id number] Lakesha Levinson K Sharley Keeler  Complications: No known complications during this procedure. PRE-OP FINDINGS  Left Ventricle: The left ventricle has mildly reduced systolic function, with an ejection fraction of 45-50%. The cavity size was normal. There is mildly increased left ventricular wall thickness. Pt on epinephrine drip.   Right Ventricle: The right ventricle has moderately reduced systolic function. The cavity was normal. There is no increase in right ventricular wall thickness.  Left Atrium: Left atrial size was not assessed. No left atrial/left atrial appendage thrombus was detected.  Right Atrium: Right atrial size was not assessed.  Interatrial Septum: No atrial level shunt detected by color flow Doppler.  Pericardium: There is no evidence of pericardial effusion.  Mitral Valve: The mitral valve is normal in structure. Mitral valve regurgitation is trivial by color flow Doppler.  Tricuspid Valve: The tricuspid valve was normal in structure. Tricuspid valve regurgitation was not visualized by color flow Doppler.  Aortic Valve: The aortic valve is normal in structure. Aortic valve regurgitation is mild by color flow Doppler. Impella in good position across the AV.  Pulmonic Valve: The pulmonic valve was normal in structure. Pulmonic valve  regurgitation is not visualized by color flow Doppler.     Roderic Palau MD Electronically signed by Roderic Palau MD Signature Date/Time: 10/12/2021/11:10:32 AM     Final      Mediastinal washout and sternal closure:  The Esmarch dressing was removed.  There was a small amount of clot and fibrinous debris in the anterior mediastinum.  This was easily removed.  The sternal edges were cleaned out with a curette.  The bone appeared to bleed easily and was viable.  The sternum was easily separated from the right ventricle.  The previously placed bypass grafts were visible coming off the aorta and appeared to be patent.  The mediastinum and chest wall tissues were copiously irrigated with saline.  A 36 French straight chest tube was brought through a separate stab incision and positioned in the anterior mediastinum.  The sternum was reapproximated with double #6 stainless steel wires.  The blood pressure drops into the 80s with sternal approximation and he returned his epinephrine up to 6 mcg/min with volume infusion and this stabilized the blood pressure which came back up into the 120s.  We were then able to wean the  epinephrine back down to 3 mcg/min.  The fascia and subcutaneous tissue was approximated as 1 layer with interrupted 0 Prolene full-thickness sutures tied over red rubber bolsters.  The skin was approximated with interrupted 2-0 nylon vertical mattress sutures.  There were areas where the skin edges could not be completely approximated. All sponge, needle, and instrument counts were reported correct at the end of the case.  A Prevena dressing was placed over the incision.gauze dressing was applied around the chest tube which was connected to pleurevac suction.  The Blake drain was connected to bulb suction.  The patient was then transported to the surgical intensive care unit in hemodynamically stable condition.

## 2021-10-12 NOTE — Anesthesia Procedure Notes (Signed)
Date/Time: 10/12/2021 7:30 AM  Performed by: Colin Benton, CRNAPre-anesthesia Checklist: Patient identified, Emergency Drugs available, Suction available and Patient being monitored Patient Re-evaluated:Patient Re-evaluated prior to induction Oxygen Delivery Method: Circle system utilized Preoxygenation: Pre-oxygenation with 100% oxygen Induction Type: Inhalational induction with existing ETT Placement Confirmation: positive ETCO2 Dental Injury: Teeth and Oropharynx as per pre-operative assessment

## 2021-10-12 NOTE — Progress Notes (Signed)
[name] NEPHROLOGY PROGRESS NOTE  Assessment/ Plan: Pt is a 61 y.o. yo male  with history of HTN, HLD, DM, A-fib, stroke, CKD, CAD multivessel dz status post CABG on 8/3 presented with shortness of breath, found to have cardiac tamponade, course complicated by cardiac arrest, use of ECMO, seen as a consultation for the evaluation of AKI and fluid volume management.  #Acute kidney injury on CKD IIIa, nonoliguric: Ischemic ATN due to cardiogenic shock/cardiac arrest and multiple hemodynamic changes.  Started CRRT on 8/28 for decreasing urine output and fluid/volume management.   Tolerating UF around 100-150 cc an hour, still looks volume overload/anasarca therefore we will continue CRRT with UF.  All 4K bath.  Discussed with ICU nurse.   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  The echo was decannulated  with placement of Impella. S/p closure of the sternum and mediastinal washout on 9/2.   #Cardiogenic shock/cardiac arrest: Was on ECMO before.  He is on multiple pressors including epinephrine, Levophed and vasopressin.  Followed closely by cardiology team.   #Acute hypoxic respiratory failure: Currently intubated and sedated.  Per pulmonary team.   #Acute blood loss anemia: Received blood transfusion.  Monitor hemoglobin and transfuse as needed by ICU team.  #Hypophosphatemia due to CRRT, replete sodium phosphate.  Monitor lab.   Subjective: The patient was seen and examined in ICU.  Went to the OR today for external closure and mediastinal mass by CT surgery.  The CRRT was held during OR.  Remains intubated and on pressors.  Discussed with ICU team and nurse.  objective Vital signs in last 24 hours: Vitals:   10/12/21 0300 10/12/21 0332 10/12/21 0400 10/12/21 0500  BP:      Pulse: 63 63 62 62  Resp: 20 20 20 20   Temp: 98.1 F (36.7 C)  98.4 F (36.9 C) 98.2 F (36.8 C)  TempSrc:      SpO2:  100% 100% 100% 100%  Weight:   106.6 kg   Height:       Weight change: 0.2 kg  Intake/Output Summary (Last 24 hours) at 10/12/2021 1245 Last data filed at 10/12/2021 1200 Gross per 24 hour  Intake 4094.75 ml  Output 3940 ml  Net 154.75 ml        Labs: RENAL PANEL Recent Labs    10/02/21 0354 10/02/21 0528 10/02/21 2124 10/03/21 0408 10/03/21 0414 10/03/21 0905 10/04/21 0256 10/04/21 8416 10/07/21 0408 10/07/21 0417 10/08/21 0350 10/08/21 0559 10/08/21 1515 10/09/21 0410 10/09/21 0419 10/09/21 6063 10/09/21 1429 10/09/21 1531 10/10/21 0344 10/10/21 1535 10/11/21 0339 10/11/21 1515 10/12/21 0359  NA 140   < > 142   < > 141   < > 144   < > 148*   < > 139   < > 141 139 137 137 138 139 137 139 138 137  138  K 4.2   < > 3.7   < > 4.2   < > 4.5   < > 3.8   < > 4.6   < > 4.6 4.1 5.5* 4.1 4.0 3.9 3.7 4.0 4.1 3.9 4.2  CL 109   < > 110  --  111   < > 110   < > 115*   < > 107  --  104 100  --  103  --  106 105 107 105 105 104  CO2 26   < > 24  --  26   < > 28   < >  25   < > 23  --  25 25  --  25  --  25 26 25 27 27 27   GLUCOSE 177*   < > 162*  --  149*   < > 142*   < > 135*   < > 253*  --  295* 328*  --  329*  --  296* 281* 271* 202* 176* 91  BUN 47*   < > 51*  --  53*   < > 50*   < > 78*   < > 51*  --  39* 35*  --  33*  --  33* 33* 35* 32* 30* 27*  CREATININE 2.77*   < > 2.71*  --  2.78*   < > 2.47*   < > 3.40*   < > 2.67*  --  2.34* 2.01*  --  1.94*  --  1.90* 1.91* 1.77* 1.79* 1.74* 1.88*  CALCIUM 7.4*   < > 7.1*  --  7.3*   < > 7.2*   < > 8.2*   < > 7.8*  --  8.0* 8.2*  --  8.2*  --  7.8* 7.9* 7.8* 8.0* 8.0* 8.3*  MG 2.7*  --  2.5*  --  2.6*  --  2.3  --   --   --  2.1  --  2.2 2.4  --   --   --   --  2.4  --  2.4  --  2.5*  PHOS 4.2  --   --   --  3.6  --   --   --   --   --  4.8*  --  3.8 3.0  --   --   --  2.4* 2.3* 2.1* 1.9* 2.7 2.7  ALBUMIN 2.0*  --   --   --  2.0*  --  2.0*   < > 1.9*  --  2.6*  --  2.5* 2.5*  --   --   --  2.3* 2.3* 2.5* 2.4* 2.3* 2.2*   < > = values  in this interval not displayed.      Liver Function Tests: Recent Labs  Lab 10/06/21 1600 10/07/21 0408 10/08/21 0350 10/11/21 0339 10/11/21 1515 10/12/21 0359  AST 99* 97*  --   --   --   --   ALT 10 15  --   --   --   --   ALKPHOS 73 89  --   --   --   --   BILITOT 2.2* 1.1  --   --   --   --   PROT 3.9* 4.1*  --   --   --   --   ALBUMIN 1.9* 1.9*   < > 2.4* 2.3* 2.2*   < > = values in this interval not displayed.    No results for input(s): "LIPASE", "AMYLASE" in the last 168 hours. No results for input(s): "AMMONIA" in the last 168 hours. CBC: Recent Labs    10/09/21 1335 10/09/21 1429 10/10/21 0344 10/11/21 0339 10/12/21 0359  HGB 7.4* 6.8* 7.2* 8.0* 7.9*  MCV 92.2  --  93.8 94.8 95.1     Cardiac Enzymes: No results for input(s): "CKTOTAL", "CKMB", "CKMBINDEX", "TROPONINI" in the last 168 hours. CBG: Recent Labs  Lab 10/11/21 1515 10/11/21 1947 10/11/21 2346 10/12/21 0345 10/12/21 1219  GLUCAP 161* 169* 124* 85 112*     Iron Studies: No results for input(s): "IRON", "TIBC", "TRANSFERRIN", "FERRITIN" in  the last 72 hours. Studies/Results: ECHO INTRAOPERATIVE TEE  Result Date: 10/12/2021  *INTRAOPERATIVE TRANSESOPHAGEAL REPORT *  Patient Name:   [name] [facility]   Date of Exam: 10/12/2021 Medical Rec #:  [id number]      Height:       67.0 in Accession #:    [id number]     Weight:       235.0 lb Date of Birth:  08-14-1960      BSA:          2.17 m Patient Age:    22 years       BP:           122/56 mmHg Patient Gender: M              HR:           63 bpm. Exam Location:  Anesthesiology Transesophogeal exam was perform intraoperatively during surgical procedure. Patient was closely monitored under general anesthesia during the entirety of examination. Indications:     Open Chest Performing Phys: [id number] BRYAN K BARTLE Complications: No known complications during this procedure. PRE-OP FINDINGS  Left Ventricle: The left ventricle has mildly reduced systolic function, with  an ejection fraction of 45-50%. The cavity size was normal. There is mildly increased left ventricular wall thickness. Pt on epinephrine drip. Right Ventricle: The right ventricle has moderately reduced systolic function. The cavity was normal. There is no increase in right ventricular wall thickness. Left Atrium: Left atrial size was not assessed. No left atrial/left atrial appendage thrombus was detected. Right Atrium: Right atrial size was not assessed. Interatrial Septum: No atrial level shunt detected by color flow Doppler. Pericardium: There is no evidence of pericardial effusion. Mitral Valve: The mitral valve is normal in structure. Mitral valve regurgitation is trivial by color flow Doppler. Tricuspid Valve: The tricuspid valve was normal in structure. Tricuspid valve regurgitation was not visualized by color flow Doppler. Aortic Valve: The aortic valve is normal in structure. Aortic valve regurgitation is mild by color flow Doppler. Impella in good position across the AV. Pulmonic Valve: The pulmonic valve was normal in structure. Pulmonic valve regurgitation is not visualized by color flow Doppler.  Roderic Palau MD Electronically signed by Roderic Palau MD Signature Date/Time: 10/12/2021/11:10:32 AM   Final    DG CHEST PORT 1 VIEW  Result Date: 10/11/2021 CLINICAL DATA:  61 year old male status post insertion of Impella device on 10/07/2021 for cardiogenic shock, status post ECMO and pericardial window last month. EXAM: PORTABLE CHEST 1 VIEW COMPARISON:  Portable chest 10/10/2021 and earlier. FINDINGS: Portable AP semi upright view at 0534 hours. Patient remains rotated to the left. Stable lines and tubes. Three left chest and/or mediastinal tubes remain in place. Impella device from right upper extremity approach appears stable. Increasing veiling left lower lung opacity and obscuration of the left mediastinal contours. No definite pneumothorax. Right lung appears negative. Paucity of bowel  gas.  Stable visualized osseous structures. IMPRESSION: 1. More rotated to the left with increased veiling left lung base opacity, obscuration of the left mediastinal contours. No definite left pneumothorax. Right lung appears negative. 2. Extensive lines and tubes appear stable. Electronically Signed   By: Genevie Ann M.D.   On: 10/11/2021 08:19    Medications: Infusions:   prismasol BGK 4/2.5 400 mL/hr at 10/12/21 1145    prismasol BGK 4/2.5 400 mL/hr at 10/12/21 1145   sodium chloride 10 mL/hr at 10/12/21 1129   albumin human Stopped (10/08/21 1821)   amiodarone  60 mg/hr (10/12/21 1155)   epinephrine 3 mcg/min (10/12/21 1128)   feeding supplement (PIVOT 1.5 CAL) 1,000 mL (10/12/21 1145)   HYDROmorphone 4 mg/hr (10/12/21 1128)   lactated ringers     lactated ringers Stopped (10/10/21 0952)   meropenem (MERREM) IV 0 mg (10/12/21 0318)   midazolam 25 mg/hr (10/12/21 1130)   nitroGLYCERIN Stopped (10/07/21 1556)   norepinephrine (LEVOPHED) Adult infusion Stopped (10/10/21 0423)   prismasol BGK 4/2.5 1,500 mL/hr at 10/12/21 1145   sodium bicarbonate 25 mEq (Impella PURGE) in dextrose 5 % 1000 mL bag     vancomycin Stopped (10/11/21 2128)   vasopressin Stopped (10/11/21 0905)    Scheduled Medications:  acetaminophen (TYLENOL) oral liquid 160 mg/5 mL  650 mg Per Tube Once   Or   acetaminophen  650 mg Rectal Once   atorvastatin  80 mg Per Tube Daily   Chlorhexidine Gluconate Cloth  6 each Topical Daily   clonazePAM  1 mg Per Tube Q8H   colchicine  0.6 mg Per Tube Daily   insulin aspart  0-15 Units Subcutaneous Q4H   insulin detemir  45 Units Subcutaneous BID   metoCLOPramide (REGLAN) injection  10 mg Intravenous Q6H   mouth rinse  15 mL Mouth Rinse Q2H   pantoprazole (PROTONIX) IV  40 mg Intravenous QHS   polyethylene glycol  17 g Per Tube Daily   QUEtiapine  50 mg Per Tube BID   sodium chloride flush  10-40 mL Intracatheter Q12H   sodium chloride flush  3 mL Intravenous Q12H    have  reviewed scheduled and prn medications.  Physical Exam: General: Critically ill looking male, intubated, sedated, not responding and has multiple lines for infusion. Heart:RRR, s1s2 nl Lungs: Multiple chest tubes and lines present. Abdomen: Abdomen is wall is edematous, mild distention present. Extremities: Both upper and lower extremities has pitting edema and anasarca ++ Dialysis Access: Left groin temporary HD catheter was placed by PCCM on 8/28, site looks clean.  Bryahna Lesko Prasad Jaki Hammerschmidt 10/12/2021,12:45 PM  LOS: 14 days

## 2021-10-12 NOTE — Progress Notes (Signed)
  Echocardiogram 2D Echocardiogram has been performed.  [name] 10/12/2021, 8:36 AM

## 2021-10-12 NOTE — Progress Notes (Signed)
Patient ID: [name], male   DOB: January 20, 1961, 61 y.o.   MRN: [id number]    Advanced Heart Failure Rounding Note   Subjective:    - 8/19 Pericardial window - 8/20 Cardiac arrest with tamponade -> Emergent bedside washout - 09/29/21 VA Cannulation - 09/30/21 Return to OR for mediastinal hemorrhage - 10/01/21 Developed AF -> amio - 10/02/21 TEE EF 25-30%  - 10/04/21 OR for washout. C/b continued bleeding overnight - 10/07/21 Placement of Impella 5.5 with washout, VA ECMO decannulation. Hypotensive with development of severe RV dysfunction after ECMO off and pressors titrated up. Multiple units of blood products. - 10/08/21 Brief PEA arrest. AFL with RVR >> S/p DCCV to SR, back in AFL shortly after - 8/31 Give 1UPRBCs   Intubated/sedated. On epi 3. Remains on CRRT. Weight stable CVP 10-11. Rhythm stable  Impella 5.5 P4 Flow 2.0 L/min   Echo 08/28: LV EF 55-60% with severe LVH, small ventricular cavity, RV appears to have near normal systolic function   Objective:     Vital Signs:   Temp:  [96.8 F (36 C)-98.6 F (37 C)] 98.2 F (36.8 C) (09/02 0500) Pulse Rate:  [58-72] 62 (09/02 0500) Resp:  [20-27] 20 (09/02 0500) BP: (129-132)/(60-63) 129/63 (09/01 1541) SpO2:  [100 %] 100 % (09/02 0500) Arterial Line BP: (96-159)/(48-66) 122/56 (09/02 0500) FiO2 (%):  [50 %] 50 % (09/02 0332) Weight:  [106.6 kg] 106.6 kg (09/02 0400) Last BM Date : 10/11/21  Weight change: Filed Weights   10/10/21 0500 10/11/21 0432 10/12/21 0400  Weight: 108 kg 106.4 kg 106.6 kg    Intake/Output:   Intake/Output Summary (Last 24 hours) at 10/12/2021 0904 Last data filed at 10/12/2021 0856 Gross per 24 hour  Intake 4029.37 ml  Output 4925 ml  Net -895.63 ml     Physical Exam: General: Intubated/sedated HEENT: normal + ETT and [place] Neck: supple. IJ HD cath Cor: chest open eshmarch in place. Impella site ok. RRR Lungs: clear Abdomen: soft, nontender, nondistended. No hepatosplenomegaly. No  bruits or masses. Good bowel sounds. Extremities: no cyanosis, clubbing, rash, 1+ edema Neuro: intubated/sedated   Telemetry: Sinus 60s Personally reviewed  Labs: Basic Metabolic Panel: Recent Labs  Lab 10/08/21 1515 10/09/21 0410 10/09/21 0419 10/10/21 0344 10/10/21 1535 10/11/21 0339 10/11/21 1515 10/12/21 0359  NA 141 139   < > 137 139 138 137 138  K 4.6 4.1   < > 3.7 4.0 4.1 3.9 4.2  CL 104 100   < > 105 107 105 105 104  CO2 25 25   < > 26 25 27 27 27   GLUCOSE 295* 328*   < > 281* 271* 202* 176* 91  BUN 39* 35*   < > 33* 35* 32* 30* 27*  CREATININE 2.34* 2.01*   < > 1.91* 1.77* 1.79* 1.74* 1.88*  CALCIUM 8.0* 8.2*   < > 7.9* 7.8* 8.0* 8.0* 8.3*  MG 2.2 2.4  --  2.4  --  2.4  --  2.5*  PHOS 3.8 3.0   < > 2.3* 2.1* 1.9* 2.7 2.7   < > = values in this interval not displayed.     Liver Function Tests: Recent Labs  Lab 10/06/21 1600 10/07/21 0408 10/08/21 0350 10/10/21 0344 10/10/21 1535 10/11/21 0339 10/11/21 1515 10/12/21 0359  AST 99* 97*  --   --   --   --   --   --   ALT 10 15  --   --   --   --   --   --  ALKPHOS 73 89  --   --   --   --   --   --   BILITOT 2.2* 1.1  --   --   --   --   --   --   PROT 3.9* 4.1*  --   --   --   --   --   --   ALBUMIN 1.9* 1.9*   < > 2.3* 2.5* 2.4* 2.3* 2.2*   < > = values in this interval not displayed.    No results for input(s): "LIPASE", "AMYLASE" in the last 168 hours. No results for input(s): "AMMONIA" in the last 168 hours.  CBC: Recent Labs  Lab 10/09/21 0410 10/09/21 0419 10/09/21 1335 10/09/21 1429 10/10/21 0344 10/11/21 0339 10/12/21 0359  WBC 16.0*  --  14.4*  --  14.3* 15.3* 14.4*  HGB 7.9*   < > 7.4* 6.8* 7.2* 8.0* 7.9*  HCT 23.9*   < > 22.5* 20.0* 22.5* 25.6* 25.0*  MCV 89.8  --  92.2  --  93.8 94.8 95.1  PLT 200  --  179  --  187 233 282   < > = values in this interval not displayed.     Cardiac Enzymes: No results for input(s): "CKTOTAL", "CKMB", "CKMBINDEX", "TROPONINI" in the last 168  hours.  BNP: BNP (last 3 results) Recent Labs    12/24/20 0651 09/28/21 0405  BNP 412.0* 826.6*     ProBNP (last 3 results) No results for input(s): "PROBNP" in the last 8760 hours.    Other results:  Imaging: DG CHEST PORT 1 VIEW  Result Date: 10/11/2021 CLINICAL DATA:  61 year old male status post insertion of Impella device on 10/07/2021 for cardiogenic shock, status post ECMO and pericardial window last month. EXAM: PORTABLE CHEST 1 VIEW COMPARISON:  Portable chest 10/10/2021 and earlier. FINDINGS: Portable AP semi upright view at 0534 hours. Patient remains rotated to the left. Stable lines and tubes. Three left chest and/or mediastinal tubes remain in place. Impella device from right upper extremity approach appears stable. Increasing veiling left lower lung opacity and obscuration of the left mediastinal contours. No definite pneumothorax. Right lung appears negative. Paucity of bowel gas.  Stable visualized osseous structures. IMPRESSION: 1. More rotated to the left with increased veiling left lung base opacity, obscuration of the left mediastinal contours. No definite left pneumothorax. Right lung appears negative. 2. Extensive lines and tubes appear stable. Electronically Signed   By: Genevie Ann M.D.   On: 10/11/2021 08:19     Medications:     Scheduled Medications:  [MAR Hold] acetaminophen (TYLENOL) oral liquid 160 mg/5 mL  650 mg Per Tube Once   Or   [MAR Hold] acetaminophen  650 mg Rectal Once   [MAR Hold] atorvastatin  80 mg Per Tube Daily   [MAR Hold] Chlorhexidine Gluconate Cloth  6 each Topical Daily   [MAR Hold] clonazePAM  1 mg Per Tube Q8H   [MAR Hold] colchicine  0.6 mg Per Tube Daily   [MAR Hold] insulin aspart  0-15 Units Subcutaneous Q4H   [MAR Hold] insulin detemir  45 Units Subcutaneous BID   [MAR Hold] metoCLOPramide (REGLAN) injection  10 mg Intravenous Q6H   [MAR Hold] mouth rinse  15 mL Mouth Rinse Q2H   [MAR Hold] pantoprazole (PROTONIX) IV  40 mg  Intravenous QHS   [MAR Hold] polyethylene glycol  17 g Per Tube Daily   [MAR Hold] QUEtiapine  50 mg Per Tube BID   [  MAR Hold] sodium chloride flush  10-40 mL Intracatheter Q12H   [MAR Hold] sodium chloride flush  3 mL Intravenous Q12H    Infusions:   prismasol BGK 4/2.5 400 mL/hr at 10/11/21 0924    prismasol BGK 4/2.5 400 mL/hr at 10/11/21 0921   [MAR Hold] sodium chloride 10 mL/hr at 10/12/21 0600   [MAR Hold] albumin human Stopped (10/08/21 1821)   amiodarone 60 mg/hr (10/12/21 0725)   [MAR Hold] epinephrine 6 mcg/min (10/12/21 0900)   feeding supplement (PIVOT 1.5 CAL) Stopped (10/11/21 2311)   HYDROmorphone 4 mg/hr (10/12/21 0725)   lactated ringers     lactated ringers Stopped (10/10/21 0952)   [MAR Hold] meropenem (MERREM) IV Stopped (10/12/21 0318)   midazolam 25 mg/hr (10/12/21 0807)   [MAR Hold] nitroGLYCERIN Stopped (10/07/21 1556)   [MAR Hold] norepinephrine (LEVOPHED) Adult infusion Stopped (10/10/21 0423)   prismasol BGK 4/2.5 1,500 mL/hr at 10/12/21 0008   sodium bicarbonate 25 mEq (Impella PURGE) in dextrose 5 % 1000 mL bag     [MAR Hold] vancomycin Stopped (10/11/21 2128)   vasopressin Stopped (10/11/21 0905)    PRN Medications: [MAR Hold] sodium chloride, 0.9 % irrigation (POUR BTL), [MAR Hold] albumin human, [MAR Hold] artificial tears, [MAR Hold] dextrose, [MAR Hold] heparin, [MAR Hold] HYDROmorphone, [MAR Hold] midazolam, [MAR Hold] midazolam, [MAR Hold] ondansetron (ZOFRAN) IV, [MAR Hold] sodium chloride, [MAR Hold] sodium chloride flush, [MAR Hold] sodium chloride flush, Surgifoam 1 Gm with Thrombin 20,000 units (20 ml) topical solution   Assessment/Plan:    1. Shock - mixed cardiogenic/hemorrhagic -> VA ECMO -> decannulated on 8/28 to Impella 5.5 - Echo 08/28: Underfilled LV, EF 55-60% with severe LVH and near normal RV systolic function.  - Impella remains at P4 Flow 2L - On Epi 3 CO-OX 73 this am. - CVP 10-11 Continues on CRRT to manage volume - Plan  chest closure this am .   2. Cardiac arrest (PEA/bradycardic) - 8/19 in setting of tamponade - PEA arrest again on 08/28  3. Cardiac tamponade with emergent bedside sternotomy  - Diffuse epicardial bleeding with post-op Dresslers - return to OR 8/21 and 8/24 for washouts.  - Washout 8/28 in OR. Multiple units of blood products in OR.   - CT/MT minimal blood output.  - Return to OR today for chest closure  4. Acute hypoxemic respiratory failure - Off ECMO.  - Vent support per CCM  5. AKI due to ATN - CRRT started 08/28. Volume status improving -  Nephrology following  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 Continues on colchicine.  7. 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 ASA held as above  8. DM2 - continue SSI + levemir  9. PAF/AFL - Tolerates poorly.  - Back in AFL. S/p DCCV  to SR 08/29.  - SR today.   - On amio gtt at 60/hr for now. Likely drop to 30 soon - No AC for now  10. ID - On vancomycin/meropenem with open chest.   11. Neuro - Has followed commands with vent wean.  - Requiring high dose sedation.   12. FEN - TFs ongoing   CRITICAL CARE Performed by: Glori Bickers  Total critical care time: 35 minutes  Critical care time was exclusive of separately billable procedures and treating other patients.  Critical care was necessary to treat or prevent imminent or life-threatening deterioration.  Critical care was time spent personally by me (independent of midlevel providers or residents)  on the following activities: development of treatment plan with patient and/or surrogate as well as nursing, discussions with consultants, evaluation of patient's response to treatment, examination of patient, obtaining history from patient or surrogate, ordering and performing treatments and interventions, ordering and review of laboratory studies, ordering and review of radiographic studies, pulse oximetry and re-evaluation of  patient's condition.   Length of Stay: [place] NPMD 10/12/2021, 9:04 AM  Advanced Heart Failure Team Pager [phone] (M-F; 7a - 4p)  Please contact [facility] for night-coverage after hours (4p -7a ) and weekends on [email or web address]

## 2021-10-12 NOTE — Brief Op Note (Signed)
10/12/2021  12:19 PM  PATIENT:  [name]  61 y.o. male  PRE-OPERATIVE DIAGNOSIS:  OPEN CHEST  POST-OPERATIVE DIAGNOSIS:  OPEN CHEST  PROCEDURE:  Procedure(s): MEDIASTINAL WASHOUT (N/A) STERNAL CLOSURE (N/A) TRANSESOPHAGEAL ECHOCARDIOGRAM (TEE) (N/A)  SURGEON:  Surgeon(s) and Role:    * Jazlynn Nemetz, Fernande Boyden, MD - Primary  PHYSICIAN ASSISTANT: none  ASSISTANTS: RNFA   ANESTHESIA:   general  EBL:  200 mL   BLOOD ADMINISTERED: 2 units PRBC  DRAINS:  one 40 F Chest Tube(s) in the anterior mediastinum and a 43F Blake drain in the subcutaneous tissue anterior to the sternum.    LOCAL MEDICATIONS USED:  NONE  SPECIMEN:  No Specimen  DISPOSITION OF SPECIMEN:  N/A  COUNTS:  YES  TOURNIQUET:  * No tourniquets in log *  DICTATION: .Note written in EPIC  PLAN OF CARE: Admit to inpatient   PATIENT DISPOSITION:  ICU - intubated and hemodynamically stable.   Delay start of Pharmacological VTE agent (>24hrs) due to surgical blood loss or risk of bleeding: yes

## 2021-10-12 NOTE — Interval H&P Note (Signed)
History and Physical Interval Note:  10/12/2021 7:21 AM  [name]  has presented today for surgery, with the diagnosis of OPEN CHEST.  The various methods of treatment have been discussed with the patient and family. After consideration of risks, benefits and other options for treatment, the patient has consented to  Procedure(s): MEDIASTINAL WASHOUT (N/A) STERNAL CLOSURE (N/A) TRANSESOPHAGEAL ECHOCARDIOGRAM (TEE) (N/A) as a surgical intervention.  The patient's history has been reviewed, patient examined, no change in status, stable for surgery.  I have reviewed the patient's chart and labs.  Questions were answered to the patient's satisfaction.     [name]

## 2021-10-12 NOTE — Progress Notes (Signed)
Patient ID: [name], male   DOB: 28-May-1960, 61 y.o.   MRN: [id number] T[facility] Evening Rounds:  He has remained stable since return from the OR after sternal closure today.  Epi at 68mg. CVP 9-10  Keeping CRRT even to slightly negative.  Minimal chest tube output .  Stable on vent. CXR looks good with persistent LLL atelectasis.  BMET    Component Value Date/Time   NA 134 (L) 10/12/2021 1654   NA 135 02/13/2021 0855   K 4.9 10/12/2021 1654   CL 105 10/12/2021 1654   CO2 22 10/12/2021 1654   GLUCOSE 231 (H) 10/12/2021 1654   BUN 31 (H) 10/12/2021 1654   BUN 9 02/13/2021 0855   CREATININE 2.11 (H) 10/12/2021 1654   CALCIUM 7.7 (L) 10/12/2021 1654   EGFR 45 (L) 02/13/2021 0855   GFRNONAA 35 (L) 10/12/2021 1654

## 2021-10-12 NOTE — Addendum Note (Signed)
Addendum  created 10/12/21 1111 by Roderic Palau, MD   Clinical Note Signed

## 2021-10-12 NOTE — Progress Notes (Signed)
NAME:  [name], MRN:  [id number], DOB:  27-Sep-1960, LOS: 59 ADMISSION DATE:  09/28/2021, CONSULTATION DATE:  8/20 REFERRING MD:  Kipp Brood, CHIEF COMPLAINT:  dyspnea   History of Present Illness:  61 y/o male underwent CABG on 8/3, readmitted on 8/19 in setting of dyspnea with a pericardial effusion requiring pericardial window via bedside sternotomy.  Has had pleural effusions, developed PEA arrest on 8/20, briefly required [facility] ECMO which was discontinued on 8/29, impella placed, started on CRRT.  Hypoxemia, repeat cardiac arrest on 8/30.    Pertinent  Medical History  CAD s/p CABG 8/3 DM2 Diverticulosis GERD Hyperlipidemia Hypertension History of stroke Tobacco abuse  Significant Hospital Events: Including procedures, antibiotic start and stop dates in addition to other pertinent events   8/19 admitted, s/p pericardial window 8/20 PEA cardiac arrest, left pigtail chest tube placement, PEA cardiac arrest due to tamponade, bedside sternotomy with pericardial drains placed. 8/21 return to OR for overnight bleeding.  8/22 minimal chest tube output.  Atrial fibrillation, controled with amiodarone.  8/22 TEE showed EF 35% at baseline, which improved significantly with decreasing ECMO flow.  8/23 tolerated diuresis  8/24 mediastinal washout, removal of hematoma 8/25 ongoing bleeding issues from posterior mediastinum 8/29 decannulated and off ECMO, 5.5 Impella placed, L femoral HD cath placed and started on CRRT 8/30 hypoxia and hypotension followed by brief arrest, gentle L lateral chest compressions performed and ROSC in roughly 1 minute. 8/31 remains deeply sedated on ventilator with open chest.  No acute events overnight 9/1 no acute events overnight, remains on ventilator deep sedation.  Tolerating aggressive volume removal per CRRT 9/2 sternotomy closure  Interim History / Subjective:   Sternotomy closure Minimal vasopressor support Impella p4   Objective   Blood  pressure 129/63, pulse 62, temperature 98.2 F (36.8 C), resp. rate 20, height 5\' 7"  (1.702 m), weight 106.6 kg, SpO2 100 %. CVP:  [7 mmHg-11 mmHg] 8 mmHg  Vent Mode: PRVC FiO2 (%):  [50 %] 50 % Set Rate:  [20 bmp] 20 bmp Vt Set:  [530 mL] 530 mL PEEP:  [8 cmH20] 8 cmH20 Plateau Pressure:  [23 cmH20-24 cmH20] 24 cmH20   Intake/Output Summary (Last 24 hours) at 10/12/2021 1037 Last data filed at 10/12/2021 0941 Gross per 24 hour  Intake 4305.31 ml  Output 4578 ml  Net -272.69 ml   Filed Weights   10/10/21 0500 10/11/21 0432 10/12/21 0400  Weight: 108 kg 106.4 kg 106.6 kg    Examination:  General:  In bed on vent HENT: NCAT ETT in place CHEST: wound vac over sternotomy, lungs clear to ausculation CV: distant, RRR, no mgr GI: BS+, soft, nontender MSK: normal bulk and tone Neuro: sedated on vent   Resolved Hospital Problem list    Assessment & Plan:  Hemorrhagic pericarditis with cardiac tamponade causing PEA arrest, s/p emergent sternotomy Open chest, washout performed> closed on 9/2 CAD s/p CABG 8/3 Paroxysmal afib Acute RV failure > resolved Impella/vasopressors per heart failure TCTS Anticoagulation per TCTS Amiodarone Colchicine to continue Statin Epi drip  Acute hypoxemic and hypercarbic respiratory failure Full mechanical vent support VAP prevention Daily WUA/SBT  Acute metabolic encephalopathy Need for sedation for mechanical ventilation PAD protocol  Clonazepam, seroquel Dilaudid to continue Wean versed infusion today  At risk for mediastinitis (open chest) Vanc/mero to continue Montior for fever  AKI Monitor BMET and UOP Replace electrolytes as needed Continue CRRT> continue volume removal Bicarb gtt per renal  Acute blood loss anemia Monitor for  bleeding Transfuse PRBC for Hgb < 7 gm/dL  DM2 SSI, levemir to continue  Nutrition needs Tube feeding to continue  Best Practice (right click and "Reselect all SmartList Selections" daily)    Diet/type: tubefeeds DVT prophylaxis: systemic heparin GI prophylaxis: PPI Lines: Central line Foley:  Yes, and it is still needed Code Status:  full code Last date of multidisciplinary goals of care discussion [per primary]  Critical care time: 32 minutes    Roselie Awkward, MD [place] [facility] Pager: [phone] Cell: [phone] After 7:00 pm call Elink  [phone]

## 2021-10-13 ENCOUNTER — Inpatient Hospital Stay (HOSPITAL_COMMUNITY): Payer: Self-pay

## 2021-10-13 DIAGNOSIS — L8990 Pressure ulcer of unspecified site, unspecified stage: Secondary | ICD-10-CM | POA: Insufficient documentation

## 2021-10-13 LAB — CBC
HCT: 30.4 % — ABNORMAL LOW (ref 39.0–52.0)
Hemoglobin: 9.7 g/dL — ABNORMAL LOW (ref 13.0–17.0)
MCH: 29.8 pg (ref 26.0–34.0)
MCHC: 31.9 g/dL (ref 30.0–36.0)
MCV: 93.3 fL (ref 80.0–100.0)
Platelets: 324 10*3/uL (ref 150–400)
RBC: 3.26 MIL/uL — ABNORMAL LOW (ref 4.22–5.81)
RDW: 17.1 % — ABNORMAL HIGH (ref 11.5–15.5)
WBC: 13.9 10*3/uL — ABNORMAL HIGH (ref 4.0–10.5)
nRBC: 0 % (ref 0.0–0.2)

## 2021-10-13 LAB — POCT I-STAT, CHEM 8
BUN: 28 mg/dL — ABNORMAL HIGH (ref 8–23)
Calcium, Ion: 1.17 mmol/L (ref 1.15–1.40)
Chloride: 103 mmol/L (ref 98–111)
Creatinine, Ser: 2.3 mg/dL — ABNORMAL HIGH (ref 0.61–1.24)
Glucose, Bld: 111 mg/dL — ABNORMAL HIGH (ref 70–99)
HCT: 22 % — ABNORMAL LOW (ref 39.0–52.0)
Hemoglobin: 7.5 g/dL — ABNORMAL LOW (ref 13.0–17.0)
Potassium: 4.6 mmol/L (ref 3.5–5.1)
Sodium: 136 mmol/L (ref 135–145)
TCO2: 23 mmol/L (ref 22–32)

## 2021-10-13 LAB — TYPE AND SCREEN
ABO/RH(D): O POS
Antibody Screen: NEGATIVE
Unit division: 0
Unit division: 0

## 2021-10-13 LAB — BASIC METABOLIC PANEL
Anion gap: 7 (ref 5–15)
BUN: 33 mg/dL — ABNORMAL HIGH (ref 8–23)
CO2: 23 mmol/L (ref 22–32)
Calcium: 7.9 mg/dL — ABNORMAL LOW (ref 8.9–10.3)
Chloride: 106 mmol/L (ref 98–111)
Creatinine, Ser: 2.09 mg/dL — ABNORMAL HIGH (ref 0.61–1.24)
GFR, Estimated: 35 mL/min — ABNORMAL LOW (ref >60)
Glucose, Bld: 283 mg/dL — ABNORMAL HIGH (ref 70–99)
Potassium: 4.6 mmol/L (ref 3.5–5.1)
Sodium: 136 mmol/L (ref 135–145)

## 2021-10-13 LAB — RENAL FUNCTION PANEL
Albumin: 2.2 g/dL — ABNORMAL LOW (ref 3.5–5.0)
Albumin: 2.3 g/dL — ABNORMAL LOW (ref 3.5–5.0)
Anion gap: 8 (ref 5–15)
Anion gap: 10 (ref 5–15)
BUN: 31 mg/dL — ABNORMAL HIGH (ref 8–23)
BUN: 33 mg/dL — ABNORMAL HIGH (ref 8–23)
CO2: 23 mmol/L (ref 22–32)
CO2: 23 mmol/L (ref 22–32)
Calcium: 8 mg/dL — ABNORMAL LOW (ref 8.9–10.3)
Calcium: 8.2 mg/dL — ABNORMAL LOW (ref 8.9–10.3)
Chloride: 104 mmol/L (ref 98–111)
Chloride: 104 mmol/L (ref 98–111)
Creatinine, Ser: 2.01 mg/dL — ABNORMAL HIGH (ref 0.61–1.24)
Creatinine, Ser: 1.93 mg/dL — ABNORMAL HIGH (ref 0.61–1.24)
GFR, Estimated: 37 mL/min — ABNORMAL LOW (ref >60)
GFR, Estimated: 39 mL/min — ABNORMAL LOW (ref >60)
Glucose, Bld: 264 mg/dL — ABNORMAL HIGH (ref 70–99)
Glucose, Bld: 261 mg/dL — ABNORMAL HIGH (ref 70–99)
Phosphorus: 3.5 mg/dL (ref 2.5–4.6)
Phosphorus: 3 mg/dL (ref 2.5–4.6)
Potassium: 4.9 mmol/L (ref 3.5–5.1)
Potassium: 4.5 mmol/L (ref 3.5–5.1)
Sodium: 135 mmol/L (ref 135–145)
Sodium: 137 mmol/L (ref 135–145)

## 2021-10-13 LAB — GLUCOSE, CAPILLARY
Glucose-Capillary: 213 mg/dL — ABNORMAL HIGH (ref 70–99)
Glucose-Capillary: 231 mg/dL — ABNORMAL HIGH (ref 70–99)
Glucose-Capillary: 242 mg/dL — ABNORMAL HIGH (ref 70–99)
Glucose-Capillary: 250 mg/dL — ABNORMAL HIGH (ref 70–99)
Glucose-Capillary: 266 mg/dL — ABNORMAL HIGH (ref 70–99)
Glucose-Capillary: 266 mg/dL — ABNORMAL HIGH (ref 70–99)

## 2021-10-13 LAB — BPAM RBC
Blood Product Expiration Date: 202309072359
Blood Product Expiration Date: 202309302359
Blood Product Expiration Date: 202310012359
ISSUE DATE / TIME: 202308310830
ISSUE DATE / TIME: 202309020800
ISSUE DATE / TIME: 202309020800
Unit Type and Rh: 5100
Unit Type and Rh: 5100
Unit Type and Rh: 5100

## 2021-10-13 LAB — MAGNESIUM: Magnesium: 2.5 mg/dL — ABNORMAL HIGH (ref 1.7–2.4)

## 2021-10-13 LAB — PROTIME-INR
INR: 1.2 (ref 0.8–1.2)
Prothrombin Time: 14.8 seconds (ref 11.4–15.2)

## 2021-10-13 LAB — COOXEMETRY PANEL
Carboxyhemoglobin: 1.5 % (ref 0.5–1.5)
Methemoglobin: 1.8 % — ABNORMAL HIGH (ref 0.0–1.5)
O2 Saturation: 76.7 %
Total hemoglobin: 10.2 g/dL — ABNORMAL LOW (ref 12.0–16.0)

## 2021-10-13 LAB — LACTATE DEHYDROGENASE: LDH: 289 U/L — ABNORMAL HIGH (ref 98–192)

## 2021-10-13 MED ORDER — INSULIN ASPART 100 UNIT/ML IJ SOLN
10.0000 [IU] | INTRAMUSCULAR | Status: DC
  Administered 2021-10-13 – 2021-10-14 (×6): 10 [IU] via SUBCUTANEOUS

## 2021-10-13 MED FILL — Medication: Qty: 1 | Status: AC

## 2021-10-13 NOTE — Progress Notes (Signed)
1 Day Post-Op Procedure(s) (LRB): MEDIASTINAL WASHOUT (N/A) STERNAL CLOSURE (N/A) TRANSESOPHAGEAL ECHOCARDIOGRAM (TEE) (N/A) Subjective:  Hemodynamically stable overnight. Put back on vasopressin 0.02 to support BP and allow volume removal.  CVP 13, Co-ox 77%.  + 879 cc yesterday but -561 cc last shift on CRRT.  Objective: Vital signs in last 24 hours: Temp:  [97 F (36.1 C)-100.2 F (37.9 C)] 99.5 F (37.5 C) (09/03 0700) Pulse Rate:  [65-73] 70 (09/03 0700) Cardiac Rhythm: Normal sinus rhythm (09/02 1600) Resp:  [18-20] 20 (09/03 0700) BP: (106)/(63) 106/63 (09/02 1537) SpO2:  [100 %] 100 % (09/03 0700) Arterial Line BP: (92-154)/(58-74) 119/63 (09/03 0700) FiO2 (%):  [60 %-100 %] 60 % (09/03 0235) Weight:  [106.5 kg] 106.5 kg (09/03 0500)  Hemodynamic parameters for last 24 hours: CVP:  [10 mmHg-15 mmHg] 13 mmHg  Intake/Output from previous day: 09/02 0701 - 09/03 0700 In: 5110.3 [I.V.:2411.2; Blood:630; NG/GT:1341.3; IV Piggyback:500.1] Out: 9678 [Urine:30; Drains:70; Stool:295; Blood:200; Chest Tube:55] Intake/Output this shift: No intake/output data recorded.  General appearance: intubated and sedated Neurologic: opening eyes spontaneously Heart: regular rate and rhythm, S1, S2 normal, no murmur Lungs: clear to auscultation bilaterally Abdomen: soft, non-tender; bowel sounds normal Extremities: extremities warm, moderate edema Wound: chest dressing intact. Chest tube output low.  Lab Results: Recent Labs    10/12/21 0359 10/12/21 0825 10/13/21 0400  WBC 14.4*  --  13.9*  HGB 7.9* 7.5* 9.7*  HCT 25.0* 22.0* 30.4*  PLT 282  --  324   BMET:  Recent Labs    10/12/21 1654 10/13/21 0400  NA 134* 135  K 4.9 4.9  CL 105 104  CO2 22 23  GLUCOSE 231* 264*  BUN 31* 31*  CREATININE 2.11* 2.01*  CALCIUM 7.7* 8.0*    PT/INR:  Recent Labs    10/13/21 0400  LABPROT 14.8  INR 1.2   ABG    Component Value Date/Time   PHART 7.389 10/09/2021 0419    HCO3 26.7 10/09/2021 1429   TCO2 23 10/12/2021 0825   ACIDBASEDEF 2.0 10/07/2021 1814   O2SAT 76.7 10/13/2021 0400   CBG (last 3)  Recent Labs    10/12/21 2010 10/12/21 2337 10/13/21 0400  GLUCAP 209* 234* 242*   CXR: stable   Assessment/Plan: S/P Procedure(s) (LRB): MEDIASTINAL WASHOUT (N/A) STERNAL CLOSURE (N/A) TRANSESOPHAGEAL ECHOCARDIOGRAM (TEE) (N/A)  Hemodynamically stable after sternal closure yesterday. Remains on epi 3 and vaso 0.02 with good Co-ox, stable CVP. Impella at [facility]. Would continue low dose epi and vaso to allow volume removal. Plan to remove Impella this week if things remain stable.  Volume removal with CRRT  Tube feeds at goal. Resume Novolog tube feed coverage with Levemir and SSI.  Vent per CCM. I think we could reduce sedation now and work on weaning when CCM feels he is ready.   LOS: 15 days    [name] 10/13/2021

## 2021-10-13 NOTE — Progress Notes (Signed)
Pharmacy Antibiotic Note  [name] is a 61 y.o. male admitted on 09/28/2021 s/p pericardial window > now with open chest/ECMO.  Pharmacy has been consulted for vancomycin and meropenem dosing.  Chest is now closed and currently planning to continue abx 48 hours post chest closure, which was performed on 9/2. WBC 13.9 and afebrile. Pt remains on CRRT.  Plan: Vanc 1000mg  IV Q24H, will consider levels if continued  Merrem to 1gm IV Q8H F/u abx plans tomorrow, 48 hours post op  Height: 5\' 7"  (170.2 cm) Weight: 106.5 kg (234 lb 12.6 oz) IBW/kg (Calculated) : 66.1  Temp (24hrs), Avg:98.6 F (37 C), Min:97 F (36.1 C), Max:99.9 F (37.7 C)  Recent Labs  Lab 10/06/21 1600 10/06/21 2155 10/07/21 1530 10/07/21 1730 10/07/21 1842 10/07/21 1857 10/08/21 0350 10/08/21 1515 10/09/21 1335 10/09/21 1531 10/10/21 0344 10/10/21 1535 10/11/21 0339 10/11/21 1515 10/12/21 0359 10/12/21 0825 10/12/21 1654 10/13/21 0400  WBC 9.8   < > 24.0*  --   --   --  16.0*   < > 14.4*  --  14.3*  --  15.3*  --  14.4*  --   --  13.9*  CREATININE 3.09*   < >  --    < >  --   --  2.67*   < >  --    < > 1.91*   < > 1.79* 1.74* 1.88* 2.30* 2.11* 2.01*  LATICACIDVEN  --   --  2.6*  --   --  2.2* 1.8  --   --   --   --   --   --   --   --   --   --   --   VANCOTROUGH 26*  --   --   --   --   --   --   --   --   --   --   --   --   --   --   --   --   --   VANCORANDOM  --   --   --   --  15  --   --   --   --   --   --   --   --   --   --   --   --   --    < > = values in this interval not displayed.     Estimated Creatinine Clearance: 44.9 mL/min (A) (by C-G formula based on SCr of 2.01 mg/dL (H)).    No Known Allergies  Antimicrobials this admission:  8/20 Vancomycin > 8/20 Meropenem >   Dose adjustments this admission:  8/23 VT 21 8/27 VT = 26 8/28 VR = 15 (~1.5 hr after CRRT started)  Microbiology results:  8/24 wound: ngtd  Thank you for allowing pharmacy to participate in this patient's  care.  Reatha Harps, PharmD PGY2 Pharmacy Resident 10/13/2021 12:35 PM Check [email or web address] for unit specific pharmacy number

## 2021-10-13 NOTE — Progress Notes (Signed)
Patient ID: [name], male   DOB: Jul 03, 1960, 61 y.o.   MRN: [id number] TCTS Evening Rounds:  Hemodynamically stable in sinus rhythm. Epi 3 mcg and vaso 0.04.  -1549 cc today so far on CRRT. CVP 13  Weaned sedation which will help BP.  BMET    Component Value Date/Time   NA 137 10/13/2021 1609   NA 135 02/13/2021 0855   K 4.5 10/13/2021 1609   CL 104 10/13/2021 1609   CO2 23 10/13/2021 1609   GLUCOSE 261 (H) 10/13/2021 1609   BUN 33 (H) 10/13/2021 1609   BUN 9 02/13/2021 0855   CREATININE 1.93 (H) 10/13/2021 1609   CALCIUM 8.2 (L) 10/13/2021 1609   EGFR 45 (L) 02/13/2021 0855   GFRNONAA 39 (L) 10/13/2021 1609   Will continue to remove as much volume as possible so that we can remove Impella later this week.

## 2021-10-13 NOTE — Progress Notes (Signed)
[name] NEPHROLOGY PROGRESS NOTE  Assessment/ Plan: Pt is a 61 y.o. yo male  with history of HTN, HLD, DM, A-fib, stroke, CKD, CAD multivessel dz status post CABG on 8/3 presented with shortness of breath, found to have cardiac tamponade, course complicated by cardiac arrest, use of ECMO, seen as a consultation for the evaluation of AKI and fluid volume management.  #Acute kidney injury on CKD IIIa, nonoliguric: Ischemic ATN due to cardiogenic shock/cardiac arrest and multiple hemodynamic changes.  Started CRRT on 8/28 for decreasing urine output and fluid/volume management.   Tolerating UF around 100-150 cc an hour, still looks volume overload/anasarca therefore we will continue CRRT with UF.  All 4K bath.  No issue with filter, discussed with ICU nurse.     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  The echo was decannulated  with placement of Impella. S/p closure of the sternum and mediastinal washout on 9/2.   #Cardiogenic shock/cardiac arrest: Was on ECMO before.  He is on multiple pressors including epinephrine, Levophed and vasopressin.  Followed closely by cardiology team.   #Acute hypoxic respiratory failure: Currently intubated and sedated.  Per pulmonary team.   #Acute blood loss anemia: Received blood transfusion.  Monitor hemoglobin and transfuse as needed by ICU team.  #Hypophosphatemia due to CRRT, replete sodium phosphate.  Phosphorus level improved.   Subjective: The patient was seen and examined in ICU.  Tolerating CRRT well with UF around 100 cc an hour.  Remains on pressors.  No urine output.  No new event.  objective Vital signs in last 24 hours: Vitals:   10/13/21 0600 10/13/21 0700 10/13/21 0800 10/13/21 0900  BP:      Pulse: 68 70 70 66  Resp: 20 20 20 20   Temp: 99.3 F (37.4 C) 99.5 F (37.5 C) 99.3 F (37.4 C) 98.8 F (37.1 C)  TempSrc:   Bladder   SpO2: 100% 100%  100% 100%  Weight:      Height:       Weight change: -0.1 kg  Intake/Output Summary (Last 24 hours) at 10/13/2021 0955 Last data filed at 10/13/2021 0900 Gross per 24 hour  Intake 4550.97 ml  Output 4672 ml  Net -121.03 ml        Labs: RENAL PANEL Recent Labs    10/02/21 2124 10/03/21 0408 10/03/21 0414 10/03/21 0905 10/04/21 0256 10/04/21 0814 10/08/21 0350 10/08/21 0559 10/08/21 1515 10/09/21 0410 10/09/21 0419 10/09/21 4818 10/09/21 1429 10/09/21 1531 10/10/21 0344 10/10/21 1535 10/11/21 0339 10/11/21 1515 10/12/21 0359 10/12/21 0825 10/12/21 1654 10/13/21 0400  NA 142   < > 141   < > 144   < > 139   < > 141 139   < > 137 138 139 137 139 138 137 138 136 134* 135  K 3.7   < > 4.2   < > 4.5   < > 4.6   < > 4.6 4.1   < > 4.1 4.0 3.9 3.7 4.0 4.1 3.9 4.2 4.6 4.9 4.9  CL 110  --  111   < > 110   < > 107  --  104 100  --  103  --  106 105 107 105 105 104 103 105 104  CO2 24  --  26   < > 28   < > 23  --  25 25  --  25  --  25 26 25 27 27 27   --  22 23  GLUCOSE 162*  --  149*   < > 142*   < > 253*  --  295* 328*  --  329*  --  296* 281* 271* 202* 176* 91 111* 231* 264*  BUN 51*  --  53*   < > 50*   < > 51*  --  39* 35*  --  33*  --  33* 33* 35* 32* 30* 27* 28* 31* 31*  CREATININE 2.71*  --  2.78*   < > 2.47*   < > 2.67*  --  2.34* 2.01*  --  1.94*  --  1.90* 1.91* 1.77* 1.79* 1.74* 1.88* 2.30* 2.11* 2.01*  CALCIUM 7.1*  --  7.3*   < > 7.2*   < > 7.8*  --  8.0* 8.2*  --  8.2*  --  7.8* 7.9* 7.8* 8.0* 8.0* 8.3*  --  7.7* 8.0*  MG 2.5*  --  2.6*  --  2.3  --  2.1  --  2.2 2.4  --   --   --   --  2.4  --  2.4  --  2.5*  --   --  2.5*  PHOS  --   --  3.6  --   --   --  4.8*  --  3.8 3.0  --   --   --  2.4* 2.3* 2.1* 1.9* 2.7 2.7  --  3.6 3.5  ALBUMIN  --   --  2.0*  --  2.0*   < > 2.6*  --  2.5* 2.5*  --   --   --  2.3* 2.3* 2.5* 2.4* 2.3* 2.2*  --  2.2* 2.2*   < > = values in this interval not displayed.      Liver Function Tests: Recent Labs  Lab 10/06/21 1600  10/07/21 0408 10/08/21 0350 10/12/21 0359 10/12/21 1654 10/13/21 0400  AST 99* 97*  --   --   --   --   ALT 10 15  --   --   --   --   ALKPHOS 73 89  --   --   --   --   BILITOT 2.2* 1.1  --   --   --   --   PROT 3.9* 4.1*  --   --   --   --   ALBUMIN 1.9* 1.9*   < > 2.2* 2.2* 2.2*   < > = values in this interval not displayed.    No results for input(s): "LIPASE", "AMYLASE" in the last 168 hours. No results for input(s): "AMMONIA" in the last 168 hours. CBC: Recent Labs    10/10/21 0344 10/11/21 0339 10/12/21 0359 10/12/21 0825 10/13/21 0400  HGB 7.2* 8.0* 7.9* 7.5* 9.7*  MCV 93.8 94.8 95.1  --  93.3     Cardiac Enzymes: No results for input(s): "CKTOTAL", "CKMB", "CKMBINDEX", "TROPONINI" in the last 168 hours. CBG: Recent Labs  Lab 10/12/21 1606 10/12/21 2010 10/12/21 2337 10/13/21 0400 10/13/21 0836  GLUCAP 221* 209* 234* 242* 266*     Iron Studies: No results for input(s): "IRON", "TIBC", "TRANSFERRIN", "FERRITIN" in the last 72 hours. Studies/Results: DG CHEST PORT 1 VIEW  Result Date: 10/13/2021 CLINICAL DATA:  Surgery follow-up. EXAM: PORTABLE CHEST 1 VIEW COMPARISON:  10/12/2021 and older exams. FINDINGS: Increased opacity noted at the right lung base consistent a pleural effusion and associated atelectasis. Persistent opacity at the left lung base also consistent with pleural fluid and  atelectasis. Mid and upper lungs are clear.  No pneumothorax. Endotracheal tube, nasal/orogastric tube and right internal jugular central venous line are stable. Left ventricular assist device is also stable. IMPRESSION: 1. New/increased opacity at the right lung base consistent with a combination of pleural effusion and atelectasis. 2. No other change from the previous day's study. No mediastinal widening, pulmonary edema or pneumothorax. 3. Stable left lung base opacity consistent with atelectasis and pleural fluid. 4. Stable well-positioned support apparatus. Electronically  Signed   By: Lajean Manes M.D.   On: 10/13/2021 08:20   DG CHEST PORT 1 VIEW  Result Date: 10/12/2021 CLINICAL DATA:  Status post cardiac surgery EXAM: PORTABLE CHEST 1 VIEW COMPARISON:  Previous studies including the examination of 10/11/2021 FINDINGS: Transverse diameter of heart is increased. There are no signs of alveolar pulmonary edema. There is increased density in left lower lung field. Left lateral CP angle is indistinct. There is interval median sternotomy. There is Impella device introduced through the right subclavian with no significant change. There is interval removal of left chest tube. There is mediastinal drain. Tip of right IJ central venous catheter is seen in superior vena cava. Tip of left IJ central venous catheter is noted at the junction of the innominate and superior vena cava. Tip of endotracheal tube is approximately 4.4 cm above the carina. Enteric tube is noted traversing the esophagus. IMPRESSION: Cardiomegaly. Increased density in left lower lung field may be due to pleural effusion and atelectasis/pneumonia. Electronically Signed   By: Elmer Picker M.D.   On: 10/12/2021 15:40   ECHO INTRAOPERATIVE TEE  Result Date: 10/12/2021  *INTRAOPERATIVE TRANSESOPHAGEAL REPORT *  Patient Name:   [name] [facility]   Date of Exam: 10/12/2021 Medical Rec #:  [id number]      Height:       67.0 in Accession #:    [id number]     Weight:       235.0 lb Date of Birth:  25-Aug-1960      BSA:          2.17 m Patient Age:    58 years       BP:           122/56 mmHg Patient Gender: M              HR:           63 bpm. Exam Location:  Anesthesiology Transesophogeal exam was perform intraoperatively during surgical procedure. Patient was closely monitored under general anesthesia during the entirety of examination. Indications:     Open Chest Performing Phys: [id number] BRYAN K BARTLE Complications: No known complications during this procedure. PRE-OP FINDINGS  Left Ventricle: The left ventricle has mildly  reduced systolic function, with an ejection fraction of 45-50%. The cavity size was normal. There is mildly increased left ventricular wall thickness. Pt on epinephrine drip. Right Ventricle: The right ventricle has moderately reduced systolic function. The cavity was normal. There is no increase in right ventricular wall thickness. Left Atrium: Left atrial size was not assessed. No left atrial/left atrial appendage thrombus was detected. Right Atrium: Right atrial size was not assessed. Interatrial Septum: No atrial level shunt detected by color flow Doppler. Pericardium: There is no evidence of pericardial effusion. Mitral Valve: The mitral valve is normal in structure. Mitral valve regurgitation is trivial by color flow Doppler. Tricuspid Valve: The tricuspid valve was normal in structure. Tricuspid valve regurgitation was not visualized by color flow Doppler. Aortic Valve: The aortic  valve is normal in structure. Aortic valve regurgitation is mild by color flow Doppler. Impella in good position across the AV. Pulmonic Valve: The pulmonic valve was normal in structure. Pulmonic valve regurgitation is not visualized by color flow Doppler.  Roderic Palau MD Electronically signed by Roderic Palau MD Signature Date/Time: 10/12/2021/11:10:32 AM   Final     Medications: Infusions:   prismasol BGK 4/2.5 400 mL/hr at 10/13/21 0009    prismasol BGK 4/2.5 400 mL/hr at 10/13/21 0009   sodium chloride 10 mL/hr at 10/13/21 0900   albumin human Stopped (10/08/21 1821)   amiodarone 60 mg/hr (10/13/21 0900)   epinephrine 3 mcg/min (10/13/21 0900)   feeding supplement (PIVOT 1.5 CAL) 65 mL/hr at 10/13/21 0900   HYDROmorphone 4 mg/hr (10/13/21 0900)   lactated ringers     lactated ringers Stopped (10/10/21 0952)   meropenem (MERREM) IV 1 g (10/13/21 0901)   midazolam 15 mg/hr (10/13/21 0900)   prismasol BGK 4/2.5 1,500 mL/hr at 10/13/21 0009   sodium bicarbonate 25 mEq (Impella PURGE) in dextrose 5 % 1000  mL bag     vancomycin Stopped (10/12/21 2112)   vasopressin 0.02 Units/min (10/13/21 0900)    Scheduled Medications:  acetaminophen (TYLENOL) oral liquid 160 mg/5 mL  650 mg Per Tube Once   Or   acetaminophen  650 mg Rectal Once   atorvastatin  80 mg Per Tube Daily   Chlorhexidine Gluconate Cloth  6 each Topical Daily   clonazePAM  1 mg Per Tube Q8H   colchicine  0.6 mg Per Tube Daily   insulin aspart  0-15 Units Subcutaneous Q4H   insulin aspart  10 Units Subcutaneous Q4H   insulin detemir  45 Units Subcutaneous BID   metoCLOPramide (REGLAN) injection  10 mg Intravenous Q6H   mouth rinse  15 mL Mouth Rinse Q2H   pantoprazole (PROTONIX) IV  40 mg Intravenous QHS   polyethylene glycol  17 g Per Tube Daily   QUEtiapine  50 mg Per Tube BID   sodium chloride flush  10-40 mL Intracatheter Q12H   sodium chloride flush  3 mL Intravenous Q12H    have reviewed scheduled and prn medications.  Physical Exam: General: Critically ill looking male, sedated, intubated, multiple lines. Heart:RRR, s1s2 nl Lungs: Coarse breath sound,  Abdomen: Abdomen is wall is edematous, mild distention present. Extremities: Both upper and lower extremities has pitting edema and anasarca ++ Dialysis Access: Left groin temporary HD catheter was placed by PCCM on 8/28, site looks clean.  Shontel Santee Prasad Jaskirat Schwieger 10/13/2021,9:55 AM  LOS: 15 days

## 2021-10-13 NOTE — Progress Notes (Signed)
NAME:  [name], MRN:  [id number], DOB:  Mar 10, 1960, LOS: 36 ADMISSION DATE:  09/28/2021, CONSULTATION DATE:  8/20 REFERRING MD:  Kipp Brood, CHIEF COMPLAINT:  dyspnea   History of Present Illness:  61 y/o male underwent CABG on 8/3, readmitted on 8/19 in setting of dyspnea with a pericardial effusion requiring pericardial window via bedside sternotomy.  Has had pleural effusions, developed PEA arrest on 8/20, briefly required [facility] ECMO which was discontinued on 8/29, impella placed, started on CRRT.  Hypoxemia, repeat cardiac arrest on 8/30.    Pertinent  Medical History  CAD s/p CABG 8/3 DM2 Diverticulosis GERD Hyperlipidemia Hypertension History of stroke Tobacco abuse  Significant Hospital Events: Including procedures, antibiotic start and stop dates in addition to other pertinent events   8/19 admitted, s/p pericardial window 8/20 PEA cardiac arrest, left pigtail chest tube placement, PEA cardiac arrest due to tamponade, bedside sternotomy with pericardial drains placed. 8/21 return to OR for overnight bleeding.  8/22 minimal chest tube output.  Atrial fibrillation, controled with amiodarone.  8/22 TEE showed EF 35% at baseline, which improved significantly with decreasing ECMO flow.  8/23 tolerated diuresis  8/24 mediastinal washout, removal of hematoma 8/25 ongoing bleeding issues from posterior mediastinum 8/29 decannulated and off ECMO, 5.5 Impella placed, L femoral HD cath placed and started on CRRT 8/30 hypoxia and hypotension followed by brief arrest, gentle L lateral chest compressions performed and ROSC in roughly 1 minute. 8/31 remains deeply sedated on ventilator with open chest.  No acute events overnight 9/1 no acute events overnight, remains on ventilator deep sedation.  Tolerating aggressive volume removal per CRRT 9/2 sternotomy closure  Interim History / Subjective:   Opening eyes Versed down some Pulling fluid with CRRT   Objective   Blood pressure  106/63, pulse 66, temperature 98.8 F (37.1 C), resp. rate 20, height 5\' 7"  (1.702 m), weight 106.5 kg, SpO2 100 %. CVP:  [10 mmHg-15 mmHg] 12 mmHg  Vent Mode: PRVC FiO2 (%):  [50 %-100 %] 50 % Set Rate:  [20 bmp] 20 bmp Vt Set:  [530 mL] 530 mL PEEP:  [8 cmH20] 8 cmH20 Plateau Pressure:  [24 cmH20-25 cmH20] 25 cmH20   Intake/Output Summary (Last 24 hours) at 10/13/2021 1002 Last data filed at 10/13/2021 1000 Gross per 24 hour  Intake 4795.02 ml  Output 4986 ml  Net -190.98 ml   Filed Weights   10/11/21 0432 10/12/21 0400 10/13/21 0500  Weight: 106.4 kg 106.6 kg 106.5 kg    Examination:  General:  In bed on vent HENT: NCAT ETT in place, impella bilateral central lines in place PULM: CTA B, vent supported breathing wound vac on sternotomy CV: RRR, no mgr, mechanical hum from impella GI: BS+, soft, nontender MSK: normal bulk and tone Neuro: sedated on vent, opens eyes to voice  9/3 CXR > 2 images reviewed, one with exhalation where it looks like he has an effusion and atelectasis, a second where he is fully inhaling and there is no effusion   Resolved Hospital Problem list    Assessment & Plan:  Hemorrhagic pericarditis with cardiac tamponade causing PEA arrest, s/p emergent sternotomy Open chest, washout performed> closed on 9/2 CAD s/p CABG 8/3 Paroxysmal afib Acute RV failure > resolved Impella/vasopressors per heart failure/TCTS Anticoagulation per TCTS Amiodarone Colchicine Statin Epi drip  Acute hypoxemic and hypercarbic respiratory failure Doubt pleural effusion on R, see CXR read above Full mechanical vent support VAP prevention Daily WUA/SBT  Acute metabolic encephalopathy Need for  sedation for mechanical ventilation PAD protocol Change versed parameters to 0-5mg  > wean today Dilaudid to continue  At risk for mediastinitis (open chest) Vanc/mero to continue Monitor for fever  AKI Monitor BMET and UOP Replace electrolytes as needed CRRT >  continue volume removal Bicarb gtt per renal > do we need this?  Acute blood loss anemia > stable Monitor for bleeding Transfuse PRBC for Hgb < 7 gm/dL  DM2 Add tube feeding coverage SSI, levemir  Nutrition needs Tube feeding to continue  Best Practice (right click and "Reselect all SmartList Selections" daily)   Diet/type: tubefeeds DVT prophylaxis: systemic heparin GI prophylaxis: PPI Lines: Central line Foley:  Yes, and it is still needed Code Status:  full code Last date of multidisciplinary goals of care discussion [per primary]  Critical care time: 33 minutes    Roselie Awkward, MD [facility] Pager: [phone] Cell: [phone] After 7:00 pm call Elink  [phone]

## 2021-10-13 NOTE — Progress Notes (Signed)
Patient ID: [name], male   DOB: 1960-05-07, 61 y.o.   MRN: [id number]    Advanced Heart Failure Rounding Note   Subjective:    - 8/19 Pericardial window - 8/20 Cardiac arrest with tamponade -> Emergent bedside washout - 09/29/21 VA Cannulation - 09/30/21 Return to OR for mediastinal hemorrhage - 10/01/21 Developed AF -> amio - 10/02/21 TEE EF 25-30%  - 10/04/21 OR for washout. C/b continued bleeding overnight - 10/07/21 Placement of Impella 5.5 with washout, VA ECMO decannulation. Hypotensive with development of severe RV dysfunction after ECMO off and pressors titrated up. Multiple units of blood products. - 10/08/21 Brief PEA arrest. AFL with RVR >> S/p DCCV to SR, back in AFL shortly after - 8/31 Give 1UPRBCs  - 9/2 OR for chest closure  Had chest closed yesterday. Tolerated well. Remains on epi 3. Low dose VP added back yesterday to support volume removal with CRT. CVP 12. Weight still up about 12 pounds from baseline  Remains on Impella at P-4. Flowing 2L. Good pulsativity  Co-ox 77%   Intubated/sedated. On epi 3. Remains on CRRT. Weight stable CVP 10-11. Rhythm stable  New R effusion on CXR. CTs dry   Echo 08/28: LV EF 55-60% with severe LVH, small ventricular cavity, RV appears to have near normal systolic function   Objective:     Vital Signs:   Temp:  [97 F (36.1 C)-100.2 F (37.9 C)] 99.5 F (37.5 C) (09/03 0700) Pulse Rate:  [65-73] 70 (09/03 0700) Resp:  [18-20] 20 (09/03 0700) BP: (106)/(63) 106/63 (09/02 1537) SpO2:  [100 %] 100 % (09/03 0700) Arterial Line BP: (92-154)/(58-74) 119/63 (09/03 0700) FiO2 (%):  [60 %-100 %] 60 % (09/03 0235) Weight:  [106.5 kg] 106.5 kg (09/03 0500) Last BM Date : 10/11/21  Weight change: Filed Weights   10/11/21 0432 10/12/21 0400 10/13/21 0500  Weight: 106.4 kg 106.6 kg 106.5 kg    Intake/Output:   Intake/Output Summary (Last 24 hours) at 10/13/2021 0834 Last data filed at 10/13/2021 0800 Gross per 24 hour  Intake  5110.34 ml  Output 4271 ml  Net 839.34 ml     Physical Exam: General:  Intubated/sedated will open eyes HEENT: normal + ETT Neck: supple. RIJ HD cath Carotids 2+ bilat; no bruits. No lymphadenopathy or thryomegaly appreciated. Cor: + Praveena wound vac. Impella site ok  Regular rate & rhythm. Lungs: clear Abdomen: soft, nontender, nondistended. No hepatosplenomegaly. No bruits or masses. Good bowel sounds. Extremities: no cyanosis, clubbing, rash, 1+ edema Neuro:intubated/sedated. Will open eyes    Telemetry: Sinus 60s Personally reviewed   Labs: Basic Metabolic Panel: Recent Labs  Lab 10/09/21 0410 10/09/21 0419 10/10/21 0344 10/10/21 1535 10/11/21 0339 10/11/21 1515 10/12/21 0359 10/12/21 0825 10/12/21 1654 10/13/21 0400  NA 139   < > 137   < > 138 137 138 136 134* 135  K 4.1   < > 3.7   < > 4.1 3.9 4.2 4.6 4.9 4.9  CL 100   < > 105   < > 105 105 104 103 105 104  CO2 25   < > 26   < > 27 27 27   --  22 23  GLUCOSE 328*   < > 281*   < > 202* 176* 91 111* 231* 264*  BUN 35*   < > 33*   < > 32* 30* 27* 28* 31* 31*  CREATININE 2.01*   < > 1.91*   < > 1.79* 1.74* 1.88* 2.30*  2.11* 2.01*  CALCIUM 8.2*   < > 7.9*   < > 8.0* 8.0* 8.3*  --  7.7* 8.0*  MG 2.4  --  2.4  --  2.4  --  2.5*  --   --  2.5*  PHOS 3.0   < > 2.3*   < > 1.9* 2.7 2.7  --  3.6 3.5   < > = values in this interval not displayed.     Liver Function Tests: Recent Labs  Lab 10/06/21 1600 10/07/21 0408 10/08/21 0350 10/11/21 0339 10/11/21 1515 10/12/21 0359 10/12/21 1654 10/13/21 0400  AST 99* 97*  --   --   --   --   --   --   ALT 10 15  --   --   --   --   --   --   ALKPHOS 73 89  --   --   --   --   --   --   BILITOT 2.2* 1.1  --   --   --   --   --   --   PROT 3.9* 4.1*  --   --   --   --   --   --   ALBUMIN 1.9* 1.9*   < > 2.4* 2.3* 2.2* 2.2* 2.2*   < > = values in this interval not displayed.    No results for input(s): "LIPASE", "AMYLASE" in the last 168 hours. No results for  input(s): "AMMONIA" in the last 168 hours.  CBC: Recent Labs  Lab 10/09/21 1335 10/09/21 1429 10/10/21 0344 10/11/21 0339 10/12/21 0359 10/12/21 0825 10/13/21 0400  WBC 14.4*  --  14.3* 15.3* 14.4*  --  13.9*  HGB 7.4*   < > 7.2* 8.0* 7.9* 7.5* 9.7*  HCT 22.5*   < > 22.5* 25.6* 25.0* 22.0* 30.4*  MCV 92.2  --  93.8 94.8 95.1  --  93.3  PLT 179  --  187 233 282  --  324   < > = values in this interval not displayed.     Cardiac Enzymes: No results for input(s): "CKTOTAL", "CKMB", "CKMBINDEX", "TROPONINI" in the last 168 hours.  BNP: BNP (last 3 results) Recent Labs    12/24/20 0651 09/28/21 0405  BNP 412.0* 826.6*     ProBNP (last 3 results) No results for input(s): "PROBNP" in the last 8760 hours.    Other results:  Imaging: DG CHEST PORT 1 VIEW  Result Date: 10/13/2021 CLINICAL DATA:  Surgery follow-up. EXAM: PORTABLE CHEST 1 VIEW COMPARISON:  10/12/2021 and older exams. FINDINGS: Increased opacity noted at the right lung base consistent a pleural effusion and associated atelectasis. Persistent opacity at the left lung base also consistent with pleural fluid and atelectasis. Mid and upper lungs are clear.  No pneumothorax. Endotracheal tube, nasal/orogastric tube and right internal jugular central venous line are stable. Left ventricular assist device is also stable. IMPRESSION: 1. New/increased opacity at the right lung base consistent with a combination of pleural effusion and atelectasis. 2. No other change from the previous day's study. No mediastinal widening, pulmonary edema or pneumothorax. 3. Stable left lung base opacity consistent with atelectasis and pleural fluid. 4. Stable well-positioned support apparatus. Electronically Signed   By: Lajean Manes M.D.   On: 10/13/2021 08:20   DG CHEST PORT 1 VIEW  Result Date: 10/12/2021 CLINICAL DATA:  Status post cardiac surgery EXAM: PORTABLE CHEST 1 VIEW COMPARISON:  Previous studies including the examination of  10/11/2021  FINDINGS: Transverse diameter of heart is increased. There are no signs of alveolar pulmonary edema. There is increased density in left lower lung field. Left lateral CP angle is indistinct. There is interval median sternotomy. There is Impella device introduced through the right subclavian with no significant change. There is interval removal of left chest tube. There is mediastinal drain. Tip of right IJ central venous catheter is seen in superior vena cava. Tip of left IJ central venous catheter is noted at the junction of the innominate and superior vena cava. Tip of endotracheal tube is approximately 4.4 cm above the carina. Enteric tube is noted traversing the esophagus. IMPRESSION: Cardiomegaly. Increased density in left lower lung field may be due to pleural effusion and atelectasis/pneumonia. Electronically Signed   By: Elmer Picker M.D.   On: 10/12/2021 15:40   ECHO INTRAOPERATIVE TEE  Result Date: 10/12/2021  *INTRAOPERATIVE TRANSESOPHAGEAL REPORT *  Patient Name:   [name] [facility]   Date of Exam: 10/12/2021 Medical Rec #:  [id number]      Height:       67.0 in Accession #:    [id number]     Weight:       235.0 lb Date of Birth:  1960-09-20      BSA:          2.17 m Patient Age:    67 years       BP:           122/56 mmHg Patient Gender: M              HR:           63 bpm. Exam Location:  Anesthesiology Transesophogeal exam was perform intraoperatively during surgical procedure. Patient was closely monitored under general anesthesia during the entirety of examination. Indications:     Open Chest Performing Phys: [id number] BRYAN K BARTLE Complications: No known complications during this procedure. PRE-OP FINDINGS  Left Ventricle: The left ventricle has mildly reduced systolic function, with an ejection fraction of 45-50%. The cavity size was normal. There is mildly increased left ventricular wall thickness. Pt on epinephrine drip. Right Ventricle: The right ventricle has moderately reduced  systolic function. The cavity was normal. There is no increase in right ventricular wall thickness. Left Atrium: Left atrial size was not assessed. No left atrial/left atrial appendage thrombus was detected. Right Atrium: Right atrial size was not assessed. Interatrial Septum: No atrial level shunt detected by color flow Doppler. Pericardium: There is no evidence of pericardial effusion. Mitral Valve: The mitral valve is normal in structure. Mitral valve regurgitation is trivial by color flow Doppler. Tricuspid Valve: The tricuspid valve was normal in structure. Tricuspid valve regurgitation was not visualized by color flow Doppler. Aortic Valve: The aortic valve is normal in structure. Aortic valve regurgitation is mild by color flow Doppler. Impella in good position across the AV. Pulmonic Valve: The pulmonic valve was normal in structure. Pulmonic valve regurgitation is not visualized by color flow Doppler.  Roderic Palau MD Electronically signed by Roderic Palau MD Signature Date/Time: 10/12/2021/11:10:32 AM   Final      Medications:     Scheduled Medications:  acetaminophen (TYLENOL) oral liquid 160 mg/5 mL  650 mg Per Tube Once   Or   acetaminophen  650 mg Rectal Once   atorvastatin  80 mg Per Tube Daily   Chlorhexidine Gluconate Cloth  6 each Topical Daily   clonazePAM  1 mg Per Tube Q8H   colchicine  0.6 mg  Per Tube Daily   insulin aspart  0-15 Units Subcutaneous Q4H   insulin aspart  10 Units Subcutaneous Q4H   insulin detemir  45 Units Subcutaneous BID   metoCLOPramide (REGLAN) injection  10 mg Intravenous Q6H   mouth rinse  15 mL Mouth Rinse Q2H   pantoprazole (PROTONIX) IV  40 mg Intravenous QHS   polyethylene glycol  17 g Per Tube Daily   QUEtiapine  50 mg Per Tube BID   sodium chloride flush  10-40 mL Intracatheter Q12H   sodium chloride flush  3 mL Intravenous Q12H    Infusions:   prismasol BGK 4/2.5 400 mL/hr at 10/13/21 0009    prismasol BGK 4/2.5 400 mL/hr at  10/13/21 0009   sodium chloride 10 mL/hr at 10/13/21 0700   albumin human Stopped (10/08/21 1821)   amiodarone 60 mg/hr (10/13/21 0700)   epinephrine 3 mcg/min (10/13/21 0803)   feeding supplement (PIVOT 1.5 CAL) 65 mL/hr at 10/13/21 0700   HYDROmorphone 4 mg/hr (10/13/21 0700)   lactated ringers     lactated ringers Stopped (10/10/21 0952)   meropenem (MERREM) IV Stopped (10/13/21 0241)   midazolam 15 mg/hr (10/13/21 0700)   prismasol BGK 4/2.5 1,500 mL/hr at 10/13/21 0009   sodium bicarbonate 25 mEq (Impella PURGE) in dextrose 5 % 1000 mL bag     vancomycin Stopped (10/12/21 2112)   vasopressin 0.02 Units/min (10/13/21 0700)    PRN Medications: sodium chloride, albumin human, artificial tears, dextrose, heparin, HYDROmorphone, midazolam, midazolam, ondansetron (ZOFRAN) IV, sodium chloride, sodium chloride flush, sodium chloride flush   Assessment/Plan:    1. Shock - mixed cardiogenic/hemorrhagic -> VA ECMO -> decannulated on 8/28 to Impella 5.5 - Echo 08/28: Underfilled LV, EF 55-60% with severe LVH and near normal RV systolic function.  - Impella remains at P4 Flow 2L - On Epi 3 CO-OX 77  this am. - CVP 10-12 Continues on CRRT to manage volume - Continue inotropes to keep pulling fluid - Would lighten sedation and begin to wean vent as able .   2. Cardiac arrest (PEA/bradycardic) - 8/19 in setting of tamponade - PEA arrest again on 08/28  3. Cardiac tamponade with emergent bedside sternotomy  - Diffuse epicardial bleeding with post-op Dresslers - return to OR 8/21 and 8/24 for washouts.  - Washout 8/28 in OR. Multiple units of blood products in OR.   - CT/MT minimal blood output.  - Chest closed on 9/2  4. Acute hypoxemic respiratory failure - Off ECMO.  - Vent support per CCM - Wean as able. Can u/s RLL to look for effusion  5. AKI due to ATN - CRRT started 08/28. Volume status improving - Continue to pull. Still 12 pounds up from pre-op  -  Nephrology  following  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 Continues on colchicine.  7. 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 ASA held as above  8. DM2 - continue SSI + levemir  9. PAF/AFL - Tolerates poorly.  - Back in AFL. S/p DCCV  to SR 08/29.  - SR today.   - Drop amio to 30/hr - No AC for now  10. ID - On vancomycin/meropenem with recent open chest.   11. Neuro - Has followed commands with vent wean.  - Requiring high dose sedation. Start to wean sedation  12. FEN - TFs ongoing @ goal   CRITICAL CARE Performed by: Glori Bickers  Total critical care time: 40  minutes  Critical care time was exclusive of separately billable procedures and treating other patients.  Critical care was necessary to treat or prevent imminent or life-threatening deterioration.  Critical care was time spent personally by me (independent of midlevel providers or residents) on the following activities: development of treatment plan with patient and/or surrogate as well as nursing, discussions with consultants, evaluation of patient's response to treatment, examination of patient, obtaining history from patient or surrogate, ordering and performing treatments and interventions, ordering and review of laboratory studies, ordering and review of radiographic studies, pulse oximetry and re-evaluation of patient's condition.   Length of Stay: [place] NPMD 10/13/2021, 8:34 AM  Advanced Heart Failure Team Pager [phone] (M-F; 7a - 4p)  Please contact [facility] for night-coverage after hours (4p -7a ) and weekends on [email or web address]

## 2021-10-14 ENCOUNTER — Inpatient Hospital Stay (HOSPITAL_COMMUNITY): Payer: Self-pay

## 2021-10-14 LAB — RENAL FUNCTION PANEL
Albumin: 2.2 g/dL — ABNORMAL LOW (ref 3.5–5.0)
Albumin: 2.4 g/dL — ABNORMAL LOW (ref 3.5–5.0)
Anion gap: 8 (ref 5–15)
Anion gap: 8 (ref 5–15)
BUN: 33 mg/dL — ABNORMAL HIGH (ref 8–23)
BUN: 32 mg/dL — ABNORMAL HIGH (ref 8–23)
CO2: 25 mmol/L (ref 22–32)
CO2: 23 mmol/L (ref 22–32)
Calcium: 8.4 mg/dL — ABNORMAL LOW (ref 8.9–10.3)
Calcium: 8.4 mg/dL — ABNORMAL LOW (ref 8.9–10.3)
Chloride: 104 mmol/L (ref 98–111)
Chloride: 104 mmol/L (ref 98–111)
Creatinine, Ser: 2.15 mg/dL — ABNORMAL HIGH (ref 0.61–1.24)
Creatinine, Ser: 2.02 mg/dL — ABNORMAL HIGH (ref 0.61–1.24)
GFR, Estimated: 34 mL/min — ABNORMAL LOW (ref >60)
GFR, Estimated: 37 mL/min — ABNORMAL LOW (ref >60)
Glucose, Bld: 229 mg/dL — ABNORMAL HIGH (ref 70–99)
Glucose, Bld: 263 mg/dL — ABNORMAL HIGH (ref 70–99)
Phosphorus: 4.4 mg/dL (ref 2.5–4.6)
Phosphorus: 3.6 mg/dL (ref 2.5–4.6)
Potassium: 5.3 mmol/L — ABNORMAL HIGH (ref 3.5–5.1)
Potassium: 5 mmol/L (ref 3.5–5.1)
Sodium: 137 mmol/L (ref 135–145)
Sodium: 135 mmol/L (ref 135–145)

## 2021-10-14 LAB — PROTIME-INR
INR: 1.2 (ref 0.8–1.2)
Prothrombin Time: 14.6 seconds (ref 11.4–15.2)

## 2021-10-14 LAB — CBC
HCT: 30.6 % — ABNORMAL LOW (ref 39.0–52.0)
Hemoglobin: 9.6 g/dL — ABNORMAL LOW (ref 13.0–17.0)
MCH: 29.8 pg (ref 26.0–34.0)
MCHC: 31.4 g/dL (ref 30.0–36.0)
MCV: 95 fL (ref 80.0–100.0)
Platelets: 413 10*3/uL — ABNORMAL HIGH (ref 150–400)
RBC: 3.22 MIL/uL — ABNORMAL LOW (ref 4.22–5.81)
RDW: 16.7 % — ABNORMAL HIGH (ref 11.5–15.5)
WBC: 13 10*3/uL — ABNORMAL HIGH (ref 4.0–10.5)
nRBC: 0.2 % (ref 0.0–0.2)

## 2021-10-14 LAB — CULTURE, RESPIRATORY W GRAM STAIN
Culture: NO GROWTH
Gram Stain: NONE SEEN

## 2021-10-14 LAB — GLUCOSE, CAPILLARY
Glucose-Capillary: 188 mg/dL — ABNORMAL HIGH (ref 70–99)
Glucose-Capillary: 193 mg/dL — ABNORMAL HIGH (ref 70–99)
Glucose-Capillary: 211 mg/dL — ABNORMAL HIGH (ref 70–99)
Glucose-Capillary: 212 mg/dL — ABNORMAL HIGH (ref 70–99)
Glucose-Capillary: 223 mg/dL — ABNORMAL HIGH (ref 70–99)
Glucose-Capillary: 225 mg/dL — ABNORMAL HIGH (ref 70–99)

## 2021-10-14 LAB — COOXEMETRY PANEL
Carboxyhemoglobin: 2.1 % — ABNORMAL HIGH (ref 0.5–1.5)
Methemoglobin: <0.7 % (ref 0.0–1.5)
O2 Saturation: 76.9 %
Total hemoglobin: 9.8 g/dL — ABNORMAL LOW (ref 12.0–16.0)

## 2021-10-14 LAB — MAGNESIUM: Magnesium: 2.4 mg/dL (ref 1.7–2.4)

## 2021-10-14 LAB — LACTATE DEHYDROGENASE: LDH: 267 U/L — ABNORMAL HIGH (ref 98–192)

## 2021-10-14 MED ORDER — DARBEPOETIN ALFA 40 MCG/0.4ML IJ SOSY
40.0000 ug | PREFILLED_SYRINGE | INTRAMUSCULAR | Status: DC
  Administered 2021-10-14: 40 ug via SUBCUTANEOUS
  Filled 2021-10-14 (×2): qty 0.4

## 2021-10-14 MED ORDER — INSULIN DETEMIR 100 UNIT/ML ~~LOC~~ SOLN
50.0000 [IU] | Freq: Two times a day (BID) | SUBCUTANEOUS | Status: DC
  Administered 2021-10-14 – 2021-10-15 (×3): 50 [IU] via SUBCUTANEOUS
  Filled 2021-10-14 (×4): qty 0.5

## 2021-10-14 MED ORDER — INSULIN ASPART 100 UNIT/ML IJ SOLN
12.0000 [IU] | INTRAMUSCULAR | Status: DC
Start: 2021-10-14 — End: 2021-10-16
  Administered 2021-10-14 – 2021-10-15 (×8): 12 [IU] via SUBCUTANEOUS

## 2021-10-14 MED ORDER — MIDAZOLAM HCL 2 MG/2ML IJ SOLN
2.0000 mg | INTRAMUSCULAR | Status: DC | PRN
  Administered 2021-10-15 – 2021-10-22 (×25): 2 mg via INTRAVENOUS
  Filled 2021-10-14 (×26): qty 2

## 2021-10-14 MED ORDER — NOREPINEPHRINE 16 MG/250ML-% IV SOLN: 0.0000 ug/min | INTRAVENOUS | Status: DC

## 2021-10-14 MED ORDER — SODIUM CHLORIDE 3 % IN NEBU
4.0000 mL | INHALATION_SOLUTION | Freq: Two times a day (BID) | RESPIRATORY_TRACT | Status: AC
Start: 2021-10-14 — End: 2021-10-16
  Administered 2021-10-14 – 2021-10-16 (×6): 4 mL via RESPIRATORY_TRACT
  Filled 2021-10-14 (×6): qty 15

## 2021-10-14 MED ORDER — PANTOPRAZOLE 2 MG/ML SUSPENSION
40.0000 mg | Freq: Every day | ORAL | Status: DC
Start: 2021-10-14 — End: 2021-10-15
  Administered 2021-10-14 – 2021-10-15 (×2): 40 mg
  Filled 2021-10-14 (×2): qty 20

## 2021-10-14 MED ORDER — PRISMASOL BGK 0/2.5 32-2.5 MEQ/L EC SOLN
Status: DC
  Filled 2021-10-14 (×15): qty 5000

## 2021-10-14 NOTE — Progress Notes (Signed)
[facility] Progress Note  Name: [name] MRN: [id number] DOB: 1960/04/28   Subjective:  Seen and examined on CRRT.  He had 5.7 liters UF over 9/3 with CRRT.  He has been anuric.  He clotted and was off of CRRT for 3 hours.  He has been on epi at 3 mcg/min and vaso at 0.04  Review of systems:  Unable to obtain 2/2 intubated and sedated    Intake/Output Summary (Last 24 hours) at 10/14/2021 0645 Last data filed at 10/14/2021 0600 Gross per 24 hour  Intake 4204.11 ml  Output 7405 ml  Net -3200.89 ml    Vitals:  Vitals:   10/14/21 0343 10/14/21 0400 10/14/21 0500 10/14/21 0600  BP: 104/60     Pulse: 74 74 71 74  Resp: 20 20 20 20   Temp:  100 F (37.8 C) 99.1 F (37.3 C) 98.6 F (37 C)  TempSrc:      SpO2: 100% 100% 100% 100%  Weight:   101.3 kg   Height:         Physical Exam:  General adult male in bed critically ill  HEENT normocephalic atraumatic opens eyes intermittently Neck supple trachea midline Lungs coarse mechanical breath sounds; FIO2 40 PEEP 8 Heart S1S2; on pressors  Abdomen soft nontender nondistended Extremities 2+ edema diffusely Neuro - sedation currently running  Access: left femoral nontunneled catheter in place   Medications reviewed   Labs:     Latest Ref Rng & Units 10/14/2021    2:35 AM 10/13/2021    4:09 PM 10/13/2021   11:54 AM  BMP  Glucose 70 - 99 mg/dL 229  261  283   BUN 8 - 23 mg/dL 33  33  33   Creatinine 0.61 - 1.24 mg/dL 2.15  1.93  2.09   Sodium 135 - 145 mmol/L 137  137  136   Potassium 3.5 - 5.1 mmol/L 5.3  4.5  4.6   Chloride 98 - 111 mmol/L 104  104  106   CO2 22 - 32 mmol/L 25  23  23    Calcium 8.9 - 10.3 mg/dL 8.4  8.2  7.9      Assessment/Plan:   Pt is a 61 y.o. yo male  with history of HTN, HLD, DM, A-fib, stroke, CKD, CAD multivessel dz status post CABG on 8/3 presented with shortness of breath, found to have cardiac tamponade, course complicated by cardiac arrest, use of ECMO, seen as a  consultation for the evaluation of AKI and fluid volume management.   #Acute kidney injury on CKD IIIa, nonoliguric: Ischemic ATN due to cardiogenic shock/cardiac arrest and multiple hemodynamic changes.  Started CRRT on 8/28 for decreasing urine output and fluid/volume management - Continue CRRT.  UF net neg 100-150 ml/hr   - on 4k fluids currently - transition to 2K for dialysate at 1.8 liters/hr.  Anticipate resuming 4K later today   #Cardiac tamponade/hemorrhagic pericarditis status post pericardial drain placement with subsequent ECMO placement.  decannulated  with placement of Impella. S/p closure of the sternum and mediastinal washout on 9/2.   #Cardiogenic shock/cardiac arrest: Was on ECMO before. Continue pressors per primary team    #Acute hypoxic respiratory failure: Currently intubated and sedated.  Vent per pulmonary.  Optimize volume status with CRRT    #Acute blood loss anemia: Monitor hemoglobin and transfuse as needed by ICU team.  Aranesp 40 mcg weekly on Mondays starting on 9/4   #Hypophosphatemia due to CRRT, replete sodium phosphate  as needed  Claudia Desanctis, MD 10/14/2021 7:03 AM

## 2021-10-14 NOTE — Procedures (Signed)
Bronchoscopy Procedure Note  JANARI YAMADA  [id number]  [birth year]  Date:10/14/21  Time:11:13 AM   Provider Performing:Brent Isabell Bonafede   Procedure(s):  Flexible bronchoscopy with bronchial alveolar lavage (31594)  Indication(s): Low grade fever, resp failure, pulmonary secretions  Consent Risks of the procedure as well as the alternatives and risks of each were explained to the patient and/or caregiver.  Consent for the procedure was obtained and is signed in the bedside chart  Anesthesia Versed infusion, dilaudid infusion   Time Out Verified patient identification, verified procedure, site/side was marked, verified correct patient position, special equipment/implants available, medications/allergies/relevant history reviewed, required imaging and test results available.   Sterile Technique Usual hand hygiene, masks, gowns, and gloves were used   Procedure Description Bronchoscope advanced through endotracheal tube and into airway.  Airways were examined down to subsegmental level with findings noted below.   Following diagnostic evaluation, BAL(s) performed in Lingula with normal saline and return of 20 fluid  Findings: Carina and R tracheobronchial tree is normal in appearance without lesion, redness or edema. There is thick grey mucus in the left lower lobe which was suctioned with some difficulty, required saline lavage.     Complications/Tolerance None; patient tolerated the procedure well. Chest X-ray is not needed post procedure.   EBL Minimal   Specimen(s) BAL lingula for culture  Roselie Awkward, MD [place] [facility] Pager: [phone] Cell: [phone] After 7:00 pm call Elink  [phone]

## 2021-10-14 NOTE — Progress Notes (Signed)
2 Days Post-Op Procedure(s) (LRB): MEDIASTINAL WASHOUT (N/A) STERNAL CLOSURE (N/A) TRANSESOPHAGEAL ECHOCARDIOGRAM (TEE) (N/A) Subjective:  Had some decrease in BP to 85 overnight and vasopressin increased to 0.04 and stopped removing volume although CVP was 15. BP came back up and did not have to use NE. He is now back to baseline with MAP 70-80's, CVP 13, Co-ox 77% on Epi 3, vaso 0.04, Impella P4.  -3127 cc yesterday. Wt recorded as down 11-12 lbs if accurate. About at preop wt if accurate.  Objective: Vital signs in last 24 hours: Temp:  [97.2 F (36.2 C)-100.8 F (38.2 C)] 98.4 F (36.9 C) (09/04 0700) Pulse Rate:  [65-75] 73 (09/04 0700) Cardiac Rhythm: Normal sinus rhythm (09/03 0800) Resp:  [18-20] 20 (09/04 0700) BP: (100-130)/(60-67) 104/60 (09/04 0343) SpO2:  [99 %-100 %] 100 % (09/04 0700) Arterial Line BP: (87-135)/(52-68) 118/67 (09/04 0700) FiO2 (%):  [40 %-50 %] 40 % (09/04 0343) Weight:  [101.3 kg] 101.3 kg (09/04 0500)  Hemodynamic parameters for last 24 hours: CVP:  [12 mmHg-24 mmHg] 12 mmHg  Intake/Output from previous day: 09/03 0701 - 09/04 0700 In: 4188.3 [I.V.:1828.9; NG/GT:1620; IV Piggyback:499.9] Out: 4010 [Urine:25; Drains:30; UVOZD:6644; Chest Tube:40] Intake/Output this shift: No intake/output data recorded.  General appearance: sedated on vent Neurologic: unable to assess. Dilaudid 4 and Versed 5/hr Heart: regular rate and rhythm, S1, S2 normal, no murmur, click, rub or gallop Lungs: rhonchi bilaterally Abdomen: soft, non-tender; bowel sounds normal; no masses,  no organomegaly Extremities: edema moderate Wound: chest dressing intact. Minimal JP output. Chest tube output minimal  Lab Results: Recent Labs    10/13/21 0400 10/14/21 0235  WBC 13.9* 13.0*  HGB 9.7* 9.6*  HCT 30.4* 30.6*  PLT 324 413*   BMET:  Recent Labs    10/13/21 1609 10/14/21 0235  NA 137 137  K 4.5 5.3*  CL 104 104  CO2 23 25  GLUCOSE 261* 229*  BUN 33* 33*   CREATININE 1.93* 2.15*  CALCIUM 8.2* 8.4*    PT/INR:  Recent Labs    10/14/21 0235  LABPROT 14.6  INR 1.2   ABG    Component Value Date/Time   PHART 7.389 10/09/2021 0419   HCO3 26.7 10/09/2021 1429   TCO2 23 10/12/2021 0825   ACIDBASEDEF 2.0 10/07/2021 1814   O2SAT 76.9 10/14/2021 0235   CBG (last 3)  Recent Labs    10/13/21 2012 10/13/21 2339 10/14/21 0406  GLUCAP 231* 213* 188*   CXR: stable LLL atelectasis.  Assessment/Plan: S/P Procedure(s) (LRB): MEDIASTINAL WASHOUT (N/A) STERNAL CLOSURE (N/A) TRANSESOPHAGEAL ECHOCARDIOGRAM (TEE) (N/A)  Hemodynamically stable with brief hypotension overnight. Remains on epi 3 and vaso 0.04 with good Co-ox, stable CVP. Impella at [facility]. Would continue low dose epi and vaso to allow volume removal. Plan to remove Impella this week if things remain stable and we can continue to remove volume.   Volume removal with CRRT   Tube feeds at goal. Increase Novolog tube feed coverage to 12 every 4 and Levemir to 50 bid since glucose still low 200's.   Vent per CCM. I think we can reduce sedation now and work on weaning when CCM feels he is ready.  DC mediastinal chest tube. Keep subcutaneous JP in.   LOS: 16 days    [name] 10/14/2021

## 2021-10-14 NOTE — Progress Notes (Signed)
Patient ID: [name], male   DOB: Aug 29, 1960, 61 y.o.   MRN: [id number] TCTS Evening Rounds:  Hemodynamically stable in sinus rhythm. Epi at 4, vaso 0.04.  -1L today so far.  Versed is off. On Dilaudid 4 mg/hr.  BMET    Component Value Date/Time   NA 135 10/14/2021 1702   NA 135 02/13/2021 0855   K 5.0 10/14/2021 1702   CL 104 10/14/2021 1702   CO2 23 10/14/2021 1702   GLUCOSE 263 (H) 10/14/2021 1702   BUN 32 (H) 10/14/2021 1702   BUN 9 02/13/2021 0855   CREATININE 2.02 (H) 10/14/2021 1702   CALCIUM 8.4 (L) 10/14/2021 1702   EGFR 45 (L) 02/13/2021 0855   GFRNONAA 37 (L) 10/14/2021 1702

## 2021-10-14 NOTE — Progress Notes (Signed)
NAME:  [name], MRN:  [id number], DOB:  [birth year], LOS: 87 ADMISSION DATE:  09/28/2021, CONSULTATION DATE:  8/20 REFERRING MD:  Kipp Brood, CHIEF COMPLAINT:  dyspnea   History of Present Illness:  61 y/o male underwent CABG on 8/3, readmitted on 8/19 in setting of dyspnea with a pericardial effusion requiring pericardial window via bedside sternotomy.  Has had pleural effusions, developed PEA arrest on 8/20, briefly required [facility] ECMO which was discontinued on 8/29, impella placed, started on CRRT.  Hypoxemia, repeat cardiac arrest on 8/30.    Pertinent  Medical History  CAD s/p CABG 8/3 DM2 Diverticulosis GERD Hyperlipidemia Hypertension History of stroke Tobacco abuse  Significant Hospital Events: Including procedures, antibiotic start and stop dates in addition to other pertinent events   8/19 admitted, s/p pericardial window 8/20 PEA cardiac arrest, left pigtail chest tube placement, PEA cardiac arrest due to tamponade, bedside sternotomy with pericardial drains placed. 8/21 return to OR for overnight bleeding.  8/22 minimal chest tube output.  Atrial fibrillation, controled with amiodarone.  8/22 TEE showed EF 35% at baseline, which improved significantly with decreasing ECMO flow.  8/23 tolerated diuresis  8/24 mediastinal washout, removal of hematoma 8/25 ongoing bleeding issues from posterior mediastinum 8/29 decannulated and off ECMO, 5.5 Impella placed, L femoral HD cath placed and started on CRRT 8/30 hypoxia and hypotension followed by brief arrest, gentle L lateral chest compressions performed and ROSC in roughly 1 minute. 8/31 remains deeply sedated on ventilator with open chest.  No acute events overnight 9/1 no acute events overnight, remains on ventilator deep sedation.  Tolerating aggressive volume removal per CRRT 9/2 sternotomy closure 9/3 wean versed  Interim History / Subjective:   Low grade temp continues Notable thick mucus, required frequent  suctioning  Objective   Blood pressure 104/60, pulse 73, temperature 98.4 F (36.9 C), resp. rate 20, height _0  (1.702 m), weight 101.3 kg, SpO2 100 %. CVP:  [12 mmHg-24 mmHg] 12 mmHg  Vent Mode: PRVC FiO2 (%):  [40 %-50 %] 40 % Set Rate:  [20 bmp] 20 bmp Vt Set:  [530 mL] 530 mL PEEP:  [8 cmH20] 8 cmH20 Plateau Pressure:  [24 cmH20-26 cmH20] 24 cmH20   Intake/Output Summary (Last 24 hours) at 10/14/2021 0741 Last data filed at 10/14/2021 0700 Gross per 24 hour  Intake 4188.26 ml  Output 7315 ml  Net -3126.74 ml   Filed Weights   10/12/21 0400 10/13/21 0500 10/14/21 0500  Weight: 106.6 kg 106.5 kg 101.3 kg    Examination:  General:  In bed on vent HENT: NCAT ETT in place PULM: Rhonchi bilaterally B, vent supported breathing CV: RRR, no mgr GI: BS+, soft, nontender MSK: normal bulk and tone Neuro: sedated on vent   9/4 CXR > ETT in place, multiple devices in place, minimal pulmonary parenchymal findings   Resolved Hospital Problem list    Assessment & Plan:  Hemorrhagic pericarditis with cardiac tamponade causing PEA arrest, s/p emergent sternotomy Open chest, washout performed> closed on 9/2 CAD s/p CABG 8/3 Paroxysmal afib Acute RV failure > resolved Imprella/vasopressors per heart failure/TTS Anticoagulation per TCTS Amiodarone Statin  Acute hypoxemic and hypercarbic respiratory failure Thick pulmonary secretions Full mechanical vent support VAP prevention Daily WUA/SBT Bronch today for pulmonary secretions Wean off sedation with plans for SBT in next 24 hours  Acute metabolic encephalopathy Need for sedation for mechanical ventilation Stop versed drip, change to prn Dilaudid to continue PAD protocol  At risk for mediastinitis (open chest) Low  grade fever Vanc/mero to continue Send BAL from bronch today  AKI Monitor BMET and UOP Replace electrolytes as needed CRRT per renal  Acute blood loss anemia > stable Monitor for bleeding Transfuse  PRBC for Hgb < 7 gm/dL  DM2 SSI, levemir Tube feeding coverage short acting insuln  Nutrition needs Tube feeding to continue  Best Practice (right click and "Reselect all SmartList Selections" daily)   Diet/type: tubefeeds DVT prophylaxis: systemic heparin GI prophylaxis: PPI Lines: Central line Foley:  Yes, and it is still needed Code Status:  full code Last date of multidisciplinary goals of care discussion [per primary]  Critical care time: 33 minutes    Roselie Awkward, MD  PCCM Pager: [phone] Cell: [phone] After 7:00 pm call Elink  [phone]

## 2021-10-14 NOTE — Progress Notes (Addendum)
Patient ID: [name], male   DOB: 30-Oct-1960, 61 y.o.   MRN: [id number]    Advanced Heart Failure Rounding Note   Subjective:    - 8/19 Pericardial window - 8/20 Cardiac arrest with tamponade -> Emergent bedside washout - 09/29/21 VA Cannulation - 09/30/21 Return to OR for mediastinal hemorrhage - 10/01/21 Developed AF -> amio - 10/02/21 TEE EF 25-30%  - 10/04/21 OR for washout. C/b continued bleeding overnight - 10/07/21 Placement of Impella 5.5 with washout, VA ECMO decannulation. Hypotensive with development of severe RV dysfunction after ECMO off and pressors titrated up. Multiple units of blood products. - 10/08/21 Brief PEA arrest. AFL with RVR >> S/p DCCV to SR, back in AFL shortly after - 8/31 Give 1UPRBCs  - 9/2 OR for chest closure  Hypotensive overnight w/ SBPs in 80s. Febrile, mTemp 100.8. CRRT paused. VP increased to 0.04.   BP improved, MAPs in 70s. CRRT resumed, current pulling 50 cc/hr. CVP 15    Remains on Impella at P-4. Flowing 1.8L.  Co-ox 77%  Intubated/sedated. On epi 3 + VP 0.04.  Remains on Vanc + meropenum. WBC stable at 13K    Echo 08/28: LV EF 55-60% with severe LVH, small ventricular cavity, RV appears to have near normal systolic function   Objective:     Vital Signs:   Temp:  [97.2 F (36.2 C)-100.8 F (38.2 C)] 98.4 F (36.9 C) (09/04 0700) Pulse Rate:  [65-75] 73 (09/04 0700) Resp:  [18-20] 20 (09/04 0700) BP: (100-130)/(60-67) 104/60 (09/04 0343) SpO2:  [99 %-100 %] 100 % (09/04 0700) Arterial Line BP: (87-135)/(52-68) 118/67 (09/04 0700) FiO2 (%):  [40 %] 40 % (09/04 0343) Weight:  [101.3 kg] 101.3 kg (09/04 0500) Last BM Date : 10/11/21  Weight change: Filed Weights   10/12/21 0400 10/13/21 0500 10/14/21 0500  Weight: 106.6 kg 106.5 kg 101.3 kg    Intake/Output:   Intake/Output Summary (Last 24 hours) at 10/14/2021 0812 Last data filed at 10/14/2021 0700 Gross per 24 hour  Intake 4032.95 ml  Output 6944 ml  Net -2911.05 ml     PHYSICAL EXAM: CVP 15  General:  intubated and sedated HEENT: + ETT, + cor trak  Neck: supple. JVD 15 cm. + rt IJ HD cath Carotids 2+ bilat; no bruits. No lymphadenopathy or thyromegaly appreciated. Cor: PMI nondisplaced. Regular rate & rhythm. No rubs, gallops or murmurs. Lungs: intubated and clear  Abdomen: soft, nontender, nondistended. No hepatosplenomegaly. No bruits or masses. Good bowel sounds. Extremities: no cyanosis, clubbing, rash, 1+ b/l LE  Neuro: intubated and sedated    Telemetry: Sinus 70s Personally reviewed   Labs: Basic Metabolic Panel: Recent Labs  Lab 10/10/21 0344 10/10/21 1535 10/11/21 0339 10/11/21 1515 10/12/21 0359 10/12/21 0825 10/12/21 1654 10/13/21 0400 10/13/21 1154 10/13/21 1609 10/14/21 0235  NA 137   < > 138   < > 138   < > 134* 135 136 137 137  K 3.7   < > 4.1   < > 4.2   < > 4.9 4.9 4.6 4.5 5.3*  CL 105   < > 105   < > 104   < > 105 104 106 104 104  CO2 26   < > 27   < > 27  --  22 23 23 23 25   GLUCOSE 281*   < > 202*   < > 91   < > 231* 264* 283* 261* 229*  BUN 33*   < > 32*   < >  27*   < > 31* 31* 33* 33* 33*  CREATININE 1.91*   < > 1.79*   < > 1.88*   < > 2.11* 2.01* 2.09* 1.93* 2.15*  CALCIUM 7.9*   < > 8.0*   < > 8.3*  --  7.7* 8.0* 7.9* 8.2* 8.4*  MG 2.4  --  2.4  --  2.5*  --   --  2.5*  --   --  2.4  PHOS 2.3*   < > 1.9*   < > 2.7  --  3.6 3.5  --  3.0 4.4   < > = values in this interval not displayed.    Liver Function Tests: Recent Labs  Lab 10/12/21 0359 10/12/21 1654 10/13/21 0400 10/13/21 1609 10/14/21 0235  ALBUMIN 2.2* 2.2* 2.2* 2.3* 2.2*   No results for input(s): "LIPASE", "AMYLASE" in the last 168 hours. No results for input(s): "AMMONIA" in the last 168 hours.  CBC: Recent Labs  Lab 10/10/21 0344 10/11/21 0339 10/12/21 0359 10/12/21 0825 10/13/21 0400 10/14/21 0235  WBC 14.3* 15.3* 14.4*  --  13.9* 13.0*  HGB 7.2* 8.0* 7.9* 7.5* 9.7* 9.6*  HCT 22.5* 25.6* 25.0* 22.0* 30.4* 30.6*  MCV 93.8  94.8 95.1  --  93.3 95.0  PLT 187 233 282  --  324 413*    Cardiac Enzymes: No results for input(s): "CKTOTAL", "CKMB", "CKMBINDEX", "TROPONINI" in the last 168 hours.  BNP: BNP (last 3 results) Recent Labs    12/24/20 0651 09/28/21 0405  BNP 412.0* 826.6*    ProBNP (last 3 results) No results for input(s): "PROBNP" in the last 8760 hours.    Other results:  Imaging: DG CHEST PORT 1 VIEW  Result Date: 10/13/2021 CLINICAL DATA:  Surgery follow-up. EXAM: PORTABLE CHEST 1 VIEW COMPARISON:  10/12/2021 and older exams. FINDINGS: Increased opacity noted at the right lung base consistent a pleural effusion and associated atelectasis. Persistent opacity at the left lung base also consistent with pleural fluid and atelectasis. Mid and upper lungs are clear.  No pneumothorax. Endotracheal tube, nasal/orogastric tube and right internal jugular central venous line are stable. Left ventricular assist device is also stable. IMPRESSION: 1. New/increased opacity at the right lung base consistent with a combination of pleural effusion and atelectasis. 2. No other change from the previous day's study. No mediastinal widening, pulmonary edema or pneumothorax. 3. Stable left lung base opacity consistent with atelectasis and pleural fluid. 4. Stable well-positioned support apparatus. Electronically Signed   By: Lajean Manes M.D.   On: 10/13/2021 08:20   DG CHEST PORT 1 VIEW  Result Date: 10/12/2021 CLINICAL DATA:  Status post cardiac surgery EXAM: PORTABLE CHEST 1 VIEW COMPARISON:  Previous studies including the examination of 10/11/2021 FINDINGS: Transverse diameter of heart is increased. There are no signs of alveolar pulmonary edema. There is increased density in left lower lung field. Left lateral CP angle is indistinct. There is interval median sternotomy. There is Impella device introduced through the right subclavian with no significant change. There is interval removal of left chest tube. There is  mediastinal drain. Tip of right IJ central venous catheter is seen in superior vena cava. Tip of left IJ central venous catheter is noted at the junction of the innominate and superior vena cava. Tip of endotracheal tube is approximately 4.4 cm above the carina. Enteric tube is noted traversing the esophagus. IMPRESSION: Cardiomegaly. Increased density in left lower lung field may be due to pleural effusion and atelectasis/pneumonia. Electronically Signed  By: Elmer Picker M.D.   On: 10/12/2021 15:40     Medications:     Scheduled Medications:  acetaminophen (TYLENOL) oral liquid 160 mg/5 mL  650 mg Per Tube Once   Or   acetaminophen  650 mg Rectal Once   atorvastatin  80 mg Per Tube Daily   Chlorhexidine Gluconate Cloth  6 each Topical Daily   clonazePAM  1 mg Per Tube Q8H   colchicine  0.6 mg Per Tube Daily   darbepoetin (ARANESP) injection - NON-DIALYSIS  40 mcg Subcutaneous Q Mon-1800   insulin aspart  0-15 Units Subcutaneous Q4H   insulin aspart  12 Units Subcutaneous Q4H   insulin detemir  50 Units Subcutaneous BID   metoCLOPramide (REGLAN) injection  10 mg Intravenous Q6H   mouth rinse  15 mL Mouth Rinse Q2H   pantoprazole (PROTONIX) IV  40 mg Intravenous QHS   polyethylene glycol  17 g Per Tube Daily   QUEtiapine  50 mg Per Tube BID   sodium chloride flush  10-40 mL Intracatheter Q12H   sodium chloride flush  3 mL Intravenous Q12H    Infusions:   prismasol BGK 4/2.5 400 mL/hr at 10/14/21 0535    prismasol BGK 4/2.5 400 mL/hr at 10/14/21 0535   sodium chloride 10 mL/hr at 10/14/21 0700   albumin human Stopped (10/08/21 1821)   amiodarone 60 mg/hr (10/14/21 0700)   epinephrine 3 mcg/min (10/14/21 0700)   feeding supplement (PIVOT 1.5 CAL) 65 mL/hr at 10/14/21 0700   HYDROmorphone 4 mg/hr (10/14/21 0700)   lactated ringers     lactated ringers Stopped (10/10/21 0952)   meropenem (MERREM) IV Stopped (10/14/21 0313)   midazolam 5 mg/hr (10/14/21 0700)    norepinephrine (LEVOPHED) Adult infusion     prismasol BGK 2/2.5 dialysis solution     sodium bicarbonate 25 mEq (Impella PURGE) in dextrose 5 % 1000 mL bag     vancomycin Stopped (10/13/21 2124)   vasopressin 0.04 Units/min (10/14/21 0700)    PRN Medications: sodium chloride, albumin human, artificial tears, dextrose, heparin, HYDROmorphone, midazolam, midazolam, ondansetron (ZOFRAN) IV, sodium chloride, sodium chloride flush, sodium chloride flush   Assessment/Plan:    1. Shock - mixed cardiogenic/hemorrhagic -> VA ECMO -> decannulated on 8/28 to Impella 5.5 - Echo 08/28: Underfilled LV, EF 55-60% with severe LVH and near normal RV systolic function.  - Impella remains at P4 Flow 1.8L - On Epi 3 CO-OX 77  this am. - CVP 15 Continues on CRRT to manage volume - Continue inotropes to keep pulling fluid - Would lighten sedation and begin to wean vent as able   2. Cardiac arrest (PEA/bradycardic) - 8/19 in setting of tamponade - PEA arrest again on 08/28  3. Cardiac tamponade with emergent bedside sternotomy  - Diffuse epicardial bleeding with post-op Dresslers - return to OR 8/21 and 8/24 for washouts.  - Washout 8/28 in OR. Multiple units of blood products in OR.   - CT/MT minimal blood output.  - Chest closed on 9/2  4. Acute hypoxemic respiratory failure - Off ECMO.  - Vent support per CCM - Wean as able. Can u/s RLL to look for effusion  5. AKI due to ATN - CRRT started 08/28. Volume status improving - Continue to pull - Nephrology following  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 Continues on colchicine.  7. 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 ASA held as above  8. DM2 - continue  SSI + levemir  9. PAF/AFL - Tolerates poorly.  - Back in AFL. S/p DCCV  to SR 08/29.  - SR today.   - continue amio 30/hr - No AC for now  10. ID - On vancomycin/meropenem with recent open chest.   11. Neuro - Has followed commands  with vent wean.  - Requiring high dose sedation. Start to wean sedation  12. FEN - TFs ongoing @ goal  13. Hyperkalemia - K 5.3 - continue CRRT     Length of Stay: [place] PA-C  10/14/2021, 8:12 AM  Advanced Heart Failure Team Pager [phone] (M-F; 7a - 4p)  Please contact [facility] for night-coverage after hours (4p -7a ) and weekends on [email or web address]   Agree with above. Dropped pressures overnight and CRT paused. Had low grade temp.   Now on epi 3 and VP 0.04.Co-ox 77%. Pulling on CRRT again. Weight reported as down 11 pounds but CVP still 15.   Impella 5.5 at P-4 flowing 2L. No alarms  On vanc/mero.   Remains in NSR on IV amio  Weaning sedation  General:  Sedated on vent . Will arouse and follow simple commands HEENT: normal Neck: supple. + HD cath  Carotids 2+ bilat; no bruits. No lymphadenopathy or thryomegaly appreciated. Cor: Sternal wound ok. + JP drain. Impella site ok Lungs: clear Abdomen: soft, nontender, nondistended. No hepatosplenomegaly. No bruits or masses. Good bowel sounds. Extremities: no cyanosis, clubbing, rash, 1-2+ edema Neuro: Will arouse and follow simple commands  very weak  Continue to pull fluid and wean sedation to work toward extubation. Given severe weakness on exam, I suspect he may need trach to wean vent.   VAD interrogated personally. Parameters stable.  CRITICAL CARE Performed by: Glori Bickers  Total critical care time: 45 minutes  Critical care time was exclusive of separately billable procedures and treating other patients.  Critical care was necessary to treat or prevent imminent or life-threatening deterioration.  Critical care was time spent personally by me (independent of midlevel providers or residents) on the following activities: development of treatment plan with patient and/or surrogate as well as nursing, discussions with consultants, evaluation of patient's response to treatment, examination  of patient, obtaining history from patient or surrogate, ordering and performing treatments and interventions, ordering and review of laboratory studies, ordering and review of radiographic studies, pulse oximetry and re-evaluation of patient's condition.  Glori Bickers, MD  9:57 AM

## 2021-10-15 ENCOUNTER — Inpatient Hospital Stay (HOSPITAL_COMMUNITY): Payer: Self-pay

## 2021-10-15 ENCOUNTER — Encounter (HOSPITAL_COMMUNITY): Payer: Self-pay | Admitting: Surgery

## 2021-10-15 DIAGNOSIS — T17500A Unspecified foreign body in bronchus causing asphyxiation, initial encounter: Secondary | ICD-10-CM

## 2021-10-15 LAB — CBC
HCT: 30 % — ABNORMAL LOW (ref 39.0–52.0)
HCT: 27.8 % — ABNORMAL LOW (ref 39.0–52.0)
Hemoglobin: 9.1 g/dL — ABNORMAL LOW (ref 13.0–17.0)
Hemoglobin: 8.4 g/dL — ABNORMAL LOW (ref 13.0–17.0)
MCH: 28.8 pg (ref 26.0–34.0)
MCH: 28.8 pg (ref 26.0–34.0)
MCHC: 30.3 g/dL (ref 30.0–36.0)
MCHC: 30.2 g/dL (ref 30.0–36.0)
MCV: 94.9 fL (ref 80.0–100.0)
MCV: 95.2 fL (ref 80.0–100.0)
Platelets: 597 10*3/uL — ABNORMAL HIGH (ref 150–400)
Platelets: 603 10*3/uL — ABNORMAL HIGH (ref 150–400)
RBC: 3.16 MIL/uL — ABNORMAL LOW (ref 4.22–5.81)
RBC: 2.92 MIL/uL — ABNORMAL LOW (ref 4.22–5.81)
RDW: 16.4 % — ABNORMAL HIGH (ref 11.5–15.5)
RDW: 16.2 % — ABNORMAL HIGH (ref 11.5–15.5)
WBC: 16.2 10*3/uL — ABNORMAL HIGH (ref 4.0–10.5)
WBC: 15.4 10*3/uL — ABNORMAL HIGH (ref 4.0–10.5)
nRBC: 0.4 % — ABNORMAL HIGH (ref 0.0–0.2)
nRBC: 0.6 % — ABNORMAL HIGH (ref 0.0–0.2)

## 2021-10-15 LAB — GLUCOSE, CAPILLARY
Glucose-Capillary: 104 mg/dL — ABNORMAL HIGH (ref 70–99)
Glucose-Capillary: 149 mg/dL — ABNORMAL HIGH (ref 70–99)
Glucose-Capillary: 158 mg/dL — ABNORMAL HIGH (ref 70–99)
Glucose-Capillary: 173 mg/dL — ABNORMAL HIGH (ref 70–99)
Glucose-Capillary: 182 mg/dL — ABNORMAL HIGH (ref 70–99)
Glucose-Capillary: 56 mg/dL — ABNORMAL LOW (ref 70–99)
Glucose-Capillary: 95 mg/dL (ref 70–99)

## 2021-10-15 LAB — COOXEMETRY PANEL
Carboxyhemoglobin: 2 % — ABNORMAL HIGH (ref 0.5–1.5)
Carboxyhemoglobin: 1.6 % — ABNORMAL HIGH (ref 0.5–1.5)
Methemoglobin: <0.7 % (ref 0.0–1.5)
Methemoglobin: <0.7 % (ref 0.0–1.5)
O2 Saturation: 71.9 %
O2 Saturation: 65.6 %
Total hemoglobin: 9.5 g/dL — ABNORMAL LOW (ref 12.0–16.0)
Total hemoglobin: 8.7 g/dL — ABNORMAL LOW (ref 12.0–16.0)

## 2021-10-15 LAB — RENAL FUNCTION PANEL
Albumin: 2.5 g/dL — ABNORMAL LOW (ref 3.5–5.0)
Albumin: 2.7 g/dL — ABNORMAL LOW (ref 3.5–5.0)
Anion gap: 13 (ref 5–15)
Anion gap: 9 (ref 5–15)
BUN: 33 mg/dL — ABNORMAL HIGH (ref 8–23)
BUN: 33 mg/dL — ABNORMAL HIGH (ref 8–23)
CO2: 24 mmol/L (ref 22–32)
CO2: 24 mmol/L (ref 22–32)
Calcium: 8.7 mg/dL — ABNORMAL LOW (ref 8.9–10.3)
Calcium: 7.8 mg/dL — ABNORMAL LOW (ref 8.9–10.3)
Chloride: 101 mmol/L (ref 98–111)
Chloride: 103 mmol/L (ref 98–111)
Creatinine, Ser: 1.88 mg/dL — ABNORMAL HIGH (ref 0.61–1.24)
Creatinine, Ser: 1.73 mg/dL — ABNORMAL HIGH (ref 0.61–1.24)
GFR, Estimated: 40 mL/min — ABNORMAL LOW (ref >60)
GFR, Estimated: 44 mL/min — ABNORMAL LOW (ref >60)
Glucose, Bld: 180 mg/dL — ABNORMAL HIGH (ref 70–99)
Glucose, Bld: 188 mg/dL — ABNORMAL HIGH (ref 70–99)
Phosphorus: 3.1 mg/dL (ref 2.5–4.6)
Phosphorus: 3.7 mg/dL (ref 2.5–4.6)
Potassium: 4.4 mmol/L (ref 3.5–5.1)
Potassium: 4.1 mmol/L (ref 3.5–5.1)
Sodium: 138 mmol/L (ref 135–145)
Sodium: 136 mmol/L (ref 135–145)

## 2021-10-15 LAB — PROTIME-INR
INR: 1.2 (ref 0.8–1.2)
INR: 1.3 — ABNORMAL HIGH (ref 0.8–1.2)
Prothrombin Time: 14.9 seconds (ref 11.4–15.2)
Prothrombin Time: 16.4 seconds — ABNORMAL HIGH (ref 11.4–15.2)

## 2021-10-15 LAB — MAGNESIUM: Magnesium: 2.7 mg/dL — ABNORMAL HIGH (ref 1.7–2.4)

## 2021-10-15 LAB — LACTIC ACID, PLASMA: Lactic Acid, Venous: 1.5 mmol/L (ref 0.5–1.9)

## 2021-10-15 LAB — LACTATE DEHYDROGENASE: LDH: 297 U/L — ABNORMAL HIGH (ref 98–192)

## 2021-10-15 LAB — APTT: aPTT: 35 seconds (ref 24–36)

## 2021-10-15 MED ORDER — DEXTROSE 50 % IV SOLN
INTRAVENOUS | Status: AC
  Filled 2021-10-15: qty 50

## 2021-10-15 MED ORDER — DEXTROSE 10 % IV SOLN: INTRAVENOUS | Status: DC

## 2021-10-15 MED ORDER — OXYCODONE HCL 5 MG PO TABS
5.0000 mg | ORAL_TABLET | Freq: Four times a day (QID) | ORAL | Status: DC
  Administered 2021-10-15 – 2021-10-16 (×5): 5 mg
  Filled 2021-10-15 (×5): qty 1

## 2021-10-15 MED ORDER — PRISMASOL BGK 4/2.5 32-4-2.5 MEQ/L EC SOLN: Status: DC

## 2021-10-15 MED ORDER — PANTOPRAZOLE SODIUM 40 MG IV SOLR
40.0000 mg | Freq: Two times a day (BID) | INTRAVENOUS | Status: DC
  Administered 2021-10-15 – 2021-10-27 (×24): 40 mg via INTRAVENOUS
  Filled 2021-10-15 (×24): qty 10

## 2021-10-15 MED ORDER — IPRATROPIUM-ALBUTEROL 0.5-2.5 (3) MG/3ML IN SOLN
3.0000 mL | RESPIRATORY_TRACT | Status: DC | PRN
  Administered 2021-10-15 – 2021-10-16 (×3): 3 mL via RESPIRATORY_TRACT
  Filled 2021-10-15 (×3): qty 3

## 2021-10-15 MED ORDER — DEXTROSE 50 % IV SOLN
12.5000 g | INTRAVENOUS | Status: AC
  Administered 2021-10-15: 12.5 g via INTRAVENOUS

## 2021-10-15 MED FILL — Thrombin (Recombinant) For Soln 20000 Unit: CUTANEOUS | Qty: 1 | Status: AC

## 2021-10-15 NOTE — Progress Notes (Addendum)
Patient ID: [name], male   DOB: [birth year], 61 y.o.   MRN: [id number]    Advanced Heart Failure Rounding Note   Subjective:    - 8/19 Pericardial window - 8/20 Cardiac arrest with tamponade -> Emergent bedside washout - 09/29/21 VA Cannulation - 09/30/21 Return to OR for mediastinal hemorrhage - 10/01/21 Developed AF -> amio - 10/02/21 TEE EF 25-30%  - 10/04/21 OR for washout. C/b continued bleeding overnight - 10/07/21 Placement of Impella 5.5 with washout, VA ECMO decannulation. Hypotensive with development of severe RV dysfunction after ECMO off and pressors titrated up. Multiple units of blood products. - 10/08/21 Brief PEA arrest. AFL with RVR >> S/p DCCV to SR, back in AFL shortly after - 8/31 Give 1UPRBCs  - 9/2 OR for chest closure - 9/3 Hypotensive overnight w/ SBPs in 80s. Febrile, mTemp 100.8. CRRT paused. VP increased to 0.04.  - 9/4 s/p bronchoscopy w/ BAL by PCCM, Cx pending    Intubated and sedated   Remains on Vanc + meropenum. AF overnight but WBC trending back up, 13>>16K. BAL cx pending.   On Epi 3 + VP 0.02. Off NE. MAPs 80s Remains on Impella at P-4. Flowing 1.8L.  Co-ox 72%  On CRRT, currently pulling 150 cc/hr. CVP 11  Amio gtt at 60/hr. NSR 70s     Echo 08/28: LV EF 55-60% with severe LVH, small ventricular cavity, RV appears to have near normal systolic function   Objective:     Vital Signs:   Temp:  [96.3 F (35.7 C)-99 F (37.2 C)] 98.6 F (37 C) (09/05 0345) Pulse Rate:  [68-84] 76 (09/05 0756) Resp:  [15-26] 20 (09/05 0756) BP: (115-128)/(68) 125/68 (09/05 0756) SpO2:  [100 %] 100 % (09/05 0756) Arterial Line BP: (87-155)/(57-78) 155/77 (09/05 0700) FiO2 (%):  [40 %-100 %] 40 % (09/05 0756) Weight:  [93.2 kg] 93.2 kg (09/05 0433) Last BM Date : 10/14/21  Weight change: Filed Weights   10/13/21 0500 10/14/21 0500 10/15/21 0433  Weight: 106.5 kg 101.3 kg 93.2 kg    Intake/Output:   Intake/Output Summary (Last 24 hours) at 10/15/2021  0839 Last data filed at 10/15/2021 0700 Gross per 24 hour  Intake 4298.07 ml  Output 6829 ml  Net -2530.93 ml    PHYSICAL EXAM: CVP 11  General: critically ill, intubated and sedated  HEENT: normal + ETT, + cortrak Neck: supple. JVD 10 cm. Carotids 2+ bilat; no bruits. No lymphadenopathy or thyromegaly appreciated. Cor: PMI nondisplaced. Regular rate & rhythm. No rubs, gallops or murmurs. + sternal dressing/ wound vac Lungs: intubated and clear  Abdomen: soft, nontender, nondistended. No hepatosplenomegaly. No bruits or masses. Good bowel sounds. Extremities: no cyanosis, clubbing, rash, 1+ b/l LE edema Neuro: intubated and sedated   Telemetry: Sinus 70s Personally reviewed   Labs: Basic Metabolic Panel: Recent Labs  Lab 10/11/21 0339 10/11/21 1515 10/12/21 0359 10/12/21 0825 10/13/21 0400 10/13/21 1154 10/13/21 1609 10/14/21 0235 10/14/21 1702 10/15/21 0404  NA 138   < > 138   < > 135 136 137 137 135 138  K 4.1   < > 4.2   < > 4.9 4.6 4.5 5.3* 5.0 4.4  CL 105   < > 104   < > 104 106 104 104 104 101  CO2 27   < > 27   < > _0 GLUCOSE 202*   < > 91   < > 264* 283* 261* 229* 263* 180*  BUN 32*   < > 27*   < > 31* 33* 33* 33* 32* 33*  CREATININE 1.79*   < > 1.88*   < > 2.01* 2.09* 1.93* 2.15* 2.02* 1.88*  CALCIUM 8.0*   < > 8.3*   < > 8.0* 7.9* 8.2* 8.4* 8.4* 8.7*  MG 2.4  --  2.5*  --  2.5*  --   --  2.4  --  2.7*  PHOS 1.9*   < > 2.7   < > 3.5  --  3.0 4.4 3.6 3.1   < > = values in this interval not displayed.    Liver Function Tests: Recent Labs  Lab 10/13/21 0400 10/13/21 1609 10/14/21 0235 10/14/21 1702 10/15/21 0404  ALBUMIN 2.2* 2.3* 2.2* 2.4* 2.5*   No results for input(s): "LIPASE", "AMYLASE" in the last 168 hours. No results for input(s): "AMMONIA" in the last 168 hours.  CBC: Recent Labs  Lab 10/11/21 0339 10/12/21 0359 10/12/21 0825 10/13/21 0400 10/14/21 0235 10/15/21 0404  WBC 15.3* 14.4*  --  13.9* 13.0* 16.2*  HGB 8.0*  7.9* 7.5* 9.7* 9.6* 9.1*  HCT 25.6* 25.0* 22.0* 30.4* 30.6* 30.0*  MCV 94.8 95.1  --  93.3 95.0 94.9  PLT 233 282  --  324 413* 597*    Cardiac Enzymes: No results for input(s): "CKTOTAL", "CKMB", "CKMBINDEX", "TROPONINI" in the last 168 hours.  BNP: BNP (last 3 results) Recent Labs    12/24/20 0651 09/28/21 0405  BNP 412.0* 826.6*    ProBNP (last 3 results) No results for input(s): "PROBNP" in the last 8760 hours.    Other results:  Imaging: DG CHEST PORT 1 VIEW  Result Date: 10/15/2021 CLINICAL DATA:  Endotracheal tube present. Open heart surgery. Impella heart pump. EXAM: PORTABLE CHEST 1 VIEW COMPARISON:  AP chest 10/14/2021 FINDINGS: Status post median sternotomy. Left ventricular assist device is not significantly changed. Right internal jugular central venous catheter sheath tip again overlies the central superior vena cava. Left internal jugular central venous catheter tip again overlies the junction of the left brachiocephalic vein and the superior vena cava. Cardiac silhouette is again moderately enlarged. Mediastinal contours are within normal limits. Mild-to-moderate calcification within the aortic arch. The lungs are clear. Unchanged left retrocardiac heterogeneous opacification. No definite pleural effusion. No pneumothorax. No acute skeletal abnormality. IMPRESSION: 1. Support apparatus not significantly changed. 2. Stable moderately enlarged cardiac silhouette. 3. Left retrocardiac opacity, likely atelectasis, unchanged. Electronically Signed   By: Yvonne Kendall M.D.   On: 10/15/2021 08:20   DG CHEST PORT 1 VIEW  Result Date: 10/14/2021 CLINICAL DATA:  Follow-up study.  Intubated in patient. EXAM: PORTABLE CHEST 1 VIEW COMPARISON:  10/13/2021 and older studies. FINDINGS: Right lung base opacity has mostly resolved, right hemidiaphragm now well-defined. Persistent left lung base opacity consistent with atelectasis. Remainder of the lungs is clear. No pneumothorax.  Endotracheal tube, enteric tube, right internal jugular central venous line, left internal jugular central venous line and left ventricular assist device are stable. IMPRESSION: 1. Interval improvement in lung aeration. Previously noted opacity at the right lung base has mostly resolved. 2. No acute findings in the lungs. No pneumothorax. Stable left lung base atelectasis. 3. Stable support apparatus. Electronically Signed   By: Lajean Manes M.D.   On: 10/14/2021 09:20     Medications:     Scheduled Medications:  acetaminophen (TYLENOL) oral liquid 160 mg/5 mL  650 mg Per Tube Once   Or   acetaminophen  650 mg  Rectal Once   atorvastatin  80 mg Per Tube Daily   Chlorhexidine Gluconate Cloth  6 each Topical Daily   clonazePAM  1 mg Per Tube Q8H   colchicine  0.6 mg Per Tube Daily   darbepoetin (ARANESP) injection - NON-DIALYSIS  40 mcg Subcutaneous Q Mon-1800   insulin aspart  0-15 Units Subcutaneous Q4H   insulin aspart  12 Units Subcutaneous Q4H   insulin detemir  50 Units Subcutaneous BID   metoCLOPramide (REGLAN) injection  10 mg Intravenous Q6H   mouth rinse  15 mL Mouth Rinse Q2H   pantoprazole  40 mg Per Tube Daily   polyethylene glycol  17 g Per Tube Daily   QUEtiapine  50 mg Per Tube BID   sodium chloride flush  10-40 mL Intracatheter Q12H   sodium chloride flush  3 mL Intravenous Q12H   sodium chloride HYPERTONIC  4 mL Nebulization BID    Infusions:   prismasol BGK 4/2.5 400 mL/hr at 10/14/21 0535    prismasol BGK 4/2.5 400 mL/hr at 10/15/21 0134   sodium chloride 10 mL/hr at 10/15/21 0700   albumin human Stopped (10/08/21 1821)   amiodarone 60 mg/hr (10/15/21 0700)   epinephrine 3 mcg/min (10/15/21 0700)   feeding supplement (PIVOT 1.5 CAL) 65 mL/hr at 10/15/21 0700   HYDROmorphone 4 mg/hr (10/15/21 0700)   meropenem (MERREM) IV Stopped (10/15/21 0141)   norepinephrine (LEVOPHED) Adult infusion Stopped (10/14/21 1900)   prismasol BGK 4/2.5 1,800 mL/hr at 10/15/21 2703    sodium bicarbonate 25 mEq (Impella PURGE) in dextrose 5 % 1000 mL bag     vancomycin Stopped (10/14/21 2201)   vasopressin 0.03 Units/min (10/15/21 0700)    PRN Medications: sodium chloride, albumin human, artificial tears, dextrose, heparin, HYDROmorphone, midazolam, midazolam, ondansetron (ZOFRAN) IV, sodium chloride, sodium chloride flush, sodium chloride flush   Assessment/Plan:    1. Shock - mixed cardiogenic/hemorrhagic -> VA ECMO -> decannulated on 8/28 to Impella 5.5 - Echo 08/28: Underfilled LV, EF 55-60% with severe LVH and near normal RV systolic function.  - Impella remains at P4 Flow 1.8L - On Epi 3 + VP 0.02, CO-OX 72  this am. - CVP 11. Continues on CRRT to manage volume - Continue inotropes to keep pulling fluid - Would lighten sedation and begin to wean vent as able   2. Cardiac arrest (PEA/bradycardic) - 8/19 in setting of tamponade - PEA arrest again on 08/28  3. Cardiac tamponade with emergent bedside sternotomy  - Diffuse epicardial bleeding with post-op Dresslers - return to OR 8/21 and 8/24 for washouts.  - Washout 8/28 in OR. Multiple units of blood products in OR.   - CT/MT minimal blood output.  - Chest closed on 9/2  4. Acute hypoxemic respiratory failure - Off ECMO.  - Vent support per CCM - Wean as able. Can u/s RLL to look for effusion - remains on Vanc + meropenum - s/p bronchoscopy w/ BAL 9/4. Cx pending   5. AKI due to ATN - CRRT started 08/28. Volume status improving - Continue to pull - Nephrology following  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 Continues on colchicine.  7. 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 ASA held as above  8. DM2 - continue SSI + levemir  9. PAF/AFL - Tolerates poorly.  - Recurrent AFL. S/p DCCV  to SR 08/29.  - SR today.   - continue amio 30/hr - No AC for now  10. ID - On vancomycin/meropenem with recent open chest.   11. Neuro - Has followed commands  with vent wean.  - Requiring high dose sedation. Start to wean sedation  12. FEN - TFs ongoing @ goal  13. Hyperkalemia - resolved, K 4.4 today  - continue CRRT    Length of Stay: [place] PA-C  10/15/2021, 8:39 AM  Advanced Heart Failure Team Pager [phone] (M-F; [place])  Please contact [facility] for night-coverage after hours (4p -7a ) and weekends on [email or web address]   Agree with above.   Remains intubated/sedated on CRRT weight down nearly 20 pounds. Impella 5.5 at P4 stable output. Waveforms ok. No alarms. Co-ox 72. CVP 10   General:  Intubated/sedated wil arouse and follows ome commands HEENT: normal Neck: supple. RIJ HD cath Carotids 2+ bilat; no bruits. No lymphadenopathy or thryomegaly appreciated. Cor: Praveena and JP drain in place Regular rate & rhythm. No rubs, gallops or murmurs. Impella site ok  Lungs: clear Abdomen: soft, nontender, nondistended. No hepatosplenomegaly. No bruits or masses. Good bowel sounds. Extremities: no cyanosis, clubbing, rash, 1+ edema Neuro:  Intubated/sedated wil arouse and follows ome commands  Very weak  Volume status improving. Continue to pull fluid with CVVHD. Suspect he will be ready for Impella removal tomorrow or Wednesday.   Continue to wean sedation with goal toward extubation but given weakness I think he will need trach.   CRITICAL CARE Performed by: Glori Bickers  Total critical care time: 45 minutes  Critical care time was exclusive of separately billable procedures and treating other patients.  Critical care was necessary to treat or prevent imminent or life-threatening deterioration.  Critical care was time spent personally by me (independent of midlevel providers or residents) on the following activities: development of treatment plan with patient and/or surrogate as well as nursing, discussions with consultants, evaluation of patient's response to treatment, examination of patient, obtaining  history from patient or surrogate, ordering and performing treatments and interventions, ordering and review of laboratory studies, ordering and review of radiographic studies, pulse oximetry and re-evaluation of patient's condition.  Glori Bickers, MD  10:21 AM

## 2021-10-15 NOTE — Progress Notes (Signed)
Patient ID: [name], male   DOB: 1960-07-17, 61 y.o.   MRN: [id number]    Progress Note from the Palliative Medicine Team at [facility]   Patient Name: [name]        Date: 10/15/2021 DOB: Dec 07, 1960  Age: 61 y.o. MRN#: [id number] Attending Physician: [name], MD Primary Care Physician: [name], MD Admit Date: 09/28/2021   Medical records reviewed, discussed with treatment team, assessed patient at bedside  61 y.o. 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 Bradycardic and hypoglycemic.   ECHO obtained which showed LVEF 50%, grade II diastolic dysfunction and moderate pericardial effusion with possible tamponade physiology.    CT Chest showed pericardial effusion and small bilateral pleural effusions. He was taken to the OR 8/19 for pericardial window.    Significant Hospital Events 8/19 admitted, s/p pericardial window 8/20 PEA cardiac arrest, left pigtail chest tube placement, PEA cardiac arrest due to tamponade, bedside sternotomy with pericardial drains placed. 8/21 return to OR for overnight bleeding. ECMO 8/22 minimal chest tube output.  Atrial fibrillation, controled with amiodarone.  8/22 TEE showed EF 35% at baseline, which improved significantly with decreasing ECMO flow.  8/23 tolerated diuresis  8/24 mediastinal washout, removal of hematoma 8/25 ongoing bleeding issues from posterior mediastinum 8/29 decannulated and off ECMO, 5.5 Impella placed, L femoral HD cath placed and started on CRRT 8/30 hypoxia and hypotension followed by brief arrest, gentle L lateral chest compressions performed and ROSC in roughly 1 minute. 8/31 remains deeply sedated on ventilator with open chest.  No acute events overnight 9/1 no acute events overnight, remains on ventilator deep sedation.  Tolerating aggressive volume removal per  CRRT 9/2 sternotomy closure 9/3 wean versed 9/4 FOB, low grade temps, notable thick mucus/frequent suctioning    Initial palliative medicine consult completed on 09/30/2021.  Established decision makers at that time.  [name] patient's,  legal wife is main contact and decision maker in the event patient does not have decision-making capacity for himself.  Today is day 48 of patient's hospitalization.  Patient remains critically ill, high risk for decompensation.     In support of family I spoke to [name]/legal spouse, I answered her questions to the best of my ability.  Colette feels very supported by treatment team's and appreciates all other updates.  Education offered on the seriousness of patient's current medical situation.  She understands.  Family remain hopeful for ongoing treatment, stabilization and improvement.  Education offered today regarding  the importance of continued conversation with the  medical providers regarding overall plan of care and treatment options,  ensuring decisions are within the context of the patients values and GOCs.  This nurse practitioner informed  [name] and that I will be out of the hospital until Monday  10-28-21 morning.  If the patient is still hospitalized I will follow-up at that time.  Call palliative medicine team phone # [phone] with questions or concerns in the interim   [name]  Palliative Medicine Team Team Phone # [phone] Pager [phone]

## 2021-10-15 NOTE — Progress Notes (Signed)
[facility] Progress Note  Name: [name] MRN: [id number] DOB: September 02, 1960   Subjective:  Seen and examined on CRRT.  He had 6.6 liters UF over 9/4 with CRRT.  He has been anuric.  Spoke with nursing and CTS at bedside - continue with fluid removal.    Review of systems:  Unable to obtain 2/2 intubated and sedated    Intake/Output Summary (Last 24 hours) at 10/15/2021 0638 Last data filed at 10/15/2021 0600 Gross per 24 hour  Intake 4457.54 ml  Output 7071 ml  Net -2613.46 ml    Vitals:  Vitals:   10/15/21 0545 10/15/21 0600 10/15/21 0615 10/15/21 0630  BP:      Pulse: 81 81 80 84  Resp: 20 (!) 22 20 20   Temp:      TempSrc:      SpO2: 100% 100% 100% 100%  Weight:      Height:         Physical Exam:    General adult male in bed critically ill  HEENT normocephalic atraumatic  Neck supple trachea midline Lungs coarse mechanical breath sounds; FIO2 40 PEEP 8 Heart S1S2; on pressors  Abdomen soft nontender nondistended Extremities 2+ edema diffusely Neuro - sedation currently running  Access: left femoral nontunneled catheter in place   Medications reviewed   Labs:     Latest Ref Rng & Units 10/15/2021    4:04 AM 10/14/2021    5:02 PM 10/14/2021    2:35 AM  BMP  Glucose 70 - 99 mg/dL 180  263  229   BUN 8 - 23 mg/dL 33  32  33   Creatinine 0.61 - 1.24 mg/dL 1.88  2.02  2.15   Sodium 135 - 145 mmol/L 138  135  137   Potassium 3.5 - 5.1 mmol/L 4.4  5.0  5.3   Chloride 98 - 111 mmol/L 101  104  104   CO2 22 - 32 mmol/L 24  23  25    Calcium 8.9 - 10.3 mg/dL 8.7  8.4  8.4      Assessment/Plan:   Pt is a 61 y.o. yo male  with history of HTN, HLD, DM, A-fib, stroke, CKD, CAD multivessel dz status post CABG on 8/3 presented with shortness of breath, found to have cardiac tamponade, course complicated by cardiac arrest, use of ECMO, seen as a consultation for the evaluation of AKI and fluid volume management.   #Acute kidney injury on CKD IIIa,  nonoliguric: Ischemic ATN due to cardiogenic shock/cardiac arrest and multiple hemodynamic changes.  Started CRRT on 8/28 for decreasing urine output and fluid/volume management - Continue CRRT.  UF net neg 100-150 ml/hr   - on 2K for dialysate at 1.8 liters/hr - K is 4.4 - transition back to 4K.  Other fluids at [facility]    #Cardiac tamponade/hemorrhagic pericarditis status post pericardial drain placement with subsequent ECMO placement.  decannulated  with placement of Impella. On colchicine per cardiology S/p closure of the sternum and mediastinal washout on 9/2.   #Cardiogenic shock/cardiac arrest: Was on ECMO before. Continue pressors per primary team    #Acute hypoxic respiratory failure: Currently intubated and sedated.  Vent per pulmonary.  Optimize volume status with CRRT    #Acute blood loss anemia: Monitor hemoglobin and transfuse as needed by ICU team.  Aranesp 40 mcg weekly on Mondays started on 9/4   #Hypophosphatemia due to CRRT, replete sodium phosphate as needed  Disposition - continue ICU monitoring  Cecille Rubin  Wellington [place], MD 10/15/2021 6:48 AM

## 2021-10-15 NOTE — Progress Notes (Signed)
Vomited this afternoon> NGT placed and LIWS> 1.5L output of bilious TF. After CRRT was resumed after filter change, BP dropped> pressors increased temporarily, 25g albumin given. Increased peak pressures on the vent, small mucus plug removed and BP improved. Now back to previous baseline pressors. NGT now with bloody output.  KUB, CXR, CBC, INR, coox pending. PPI increased to IV BID Dr. Cyndia Bent updated.  [name], DO 10/15/21 3:25 PM [facility]

## 2021-10-15 NOTE — Progress Notes (Addendum)
[facility] Progress Note Patient Name: [name] DOB: 16-Oct-1960 MRN: [id number]   Date of Service  10/15/2021  HPI/Events of Note  Blood sugar dropped to 56 and patient required D 50 x 1 ith blood sugar coming up to 104, he is NPO and due to receive 50 units of Levemir tonight.  eICU Interventions  Levemir held, D 10 % water gtt ordered at 10 ml / hour.        Kerry Kass Khristina Janota 10/15/2021, 8:32 PM

## 2021-10-15 NOTE — Progress Notes (Signed)
3 Days Post-Op Procedure(s) (LRB): MEDIASTINAL WASHOUT (N/A) STERNAL CLOSURE (N/A) TRANSESOPHAGEAL ECHOCARDIOGRAM (TEE) (N/A) Subjective:  Hemodynamically stable overnight on epi 3, vaso 0.04. weaned vaso to 0.02 today. CVP 12, Co-ox 66% this afternoon.  He vomited this afternoon and had NGT inserted with 1.5L bilious output. He was felt to likely have some aspiration of tube feeds with tube feeds suctioned from back of throat. He also had drop in BP after CRRT resumed after filter change requiring increased pressors temporarily Increased peak pressure on vent, small mucous plug removed. NG tube output became bloody at end.   -2800 cc yesterday and -1873 so far today. Recorded wt down to 205. Preop 222.  Objective: Vital signs in last 24 hours: Temp:  [96.3 F (35.7 C)-99 F (37.2 C)] 97.8 F (36.6 C) (09/05 1200) Pulse Rate:  [69-84] 69 (09/05 1450) Cardiac Rhythm: Normal sinus rhythm (09/05 1200) Resp:  [16-27] 16 (09/05 1450) BP: (103-128)/(61-68) 103/61 (09/05 1044) SpO2:  [100 %] 100 % (09/05 1508) Arterial Line BP: (105-155)/(59-78) 128/67 (09/05 1200) FiO2 (%):  [40 %] 40 % (09/05 1508) Weight:  [93.2 kg] 93.2 kg (09/05 0433)  Hemodynamic parameters for last 24 hours: CVP:  [10 mmHg-31 mmHg] 12 mmHg  Intake/Output from previous day: 09/04 0701 - 09/05 0700 In: 4449.5 [I.V.:1849.7; NG/GT:1760; IV Piggyback:621.3] Out: 7672 [Drains:25; Stool:235] Intake/Output this shift: Total I/O In: 984.6 [I.V.:360; Other:54.6; NG/GT:470; IV Piggyback:100] Out: 2858 [Stool:635]  General appearance: intubated on vent Neurologic: eyes open, reportedly squeezing hand this morning. Heart: regular rate and rhythm, S1, S2 normal, no murmur Lungs: clear to auscultation bilaterally Abdomen: soft, non-tender; bowel sounds normal, protuberant but at baseline. Extremities: edema improving Wound: chest dressing intact  Lab Results: Recent Labs    10/15/21 0404 10/15/21 1519  WBC 16.2*  15.4*  HGB 9.1* 8.4*  HCT 30.0* 27.8*  PLT 597* 603*   BMET:  Recent Labs    10/14/21 1702 10/15/21 0404  NA 135 138  K 5.0 4.4  CL 104 101  CO2 23 24  GLUCOSE 263* 180*  BUN 32* 33*  CREATININE 2.02* 1.88*  CALCIUM 8.4* 8.7*    PT/INR:  Recent Labs    10/15/21 1519  LABPROT 16.4*  INR 1.3*   ABG    Component Value Date/Time   PHART 7.389 10/09/2021 0419   HCO3 26.7 10/09/2021 1429   TCO2 23 10/12/2021 0825   ACIDBASEDEF 2.0 10/07/2021 1814   O2SAT 65.6 10/15/2021 1503   CBG (last 3)  Recent Labs    10/15/21 0828 10/15/21 1117 10/15/21 1539  GLUCAP 182* 158* 149*   KUB: unremarkable bowel gas pattern. Feeding tube tip in duodenum.  CXR: left base atelectasis unchanged from prio.  Assessment/Plan: S/P Procedure(s) (LRB): MEDIASTINAL WASHOUT (N/A) STERNAL CLOSURE (N/A) TRANSESOPHAGEAL ECHOCARDIOGRAM (TEE) (N/A)  He has been hemodynamically stable on low dose epi and vaso at 0.04 max. We have been able to continue volume removal. Impella at P4. Will turn down to P3.  Vomiting today with question of aspiration. CXR looks ok. KUB shows no signs of ileus and he has active BS. Will hold tube feeds for now and probably resume slowly in am with intermittent suction on NG tube.  Question of GI bleeding. Hgb down slightly from this am but had filter change. Will continue PPI and follow NG output. Repeat labs in am.  Continue volume removal as tolerated guided by BP and CVP. Not sure what his dry weight is. He was volume overloaded on admission  at 222 lbs.  Tentatively plan to remove Impella on Thursday in OR. Possible perc trach by CCM at same time.   LOS: 17 days    [name] 10/15/2021

## 2021-10-15 NOTE — Progress Notes (Signed)
NAME:  [name], MRN:  [id number], DOB:  08-Apr-1960, LOS: 27 ADMISSION DATE:  09/28/2021, CONSULTATION DATE:  8/20 REFERRING MD:  Kipp Brood, CHIEF COMPLAINT:  dyspnea   History of Present Illness:  61 y/o male underwent CABG on 8/3, readmitted on 8/19 in setting of dyspnea with a pericardial effusion requiring pericardial window via bedside sternotomy.  Has had pleural effusions, developed PEA arrest on 8/20, briefly required [facility] ECMO which was discontinued on 8/29, impella placed, started on CRRT.  Hypoxemia, repeat cardiac arrest on 8/30.    Pertinent  Medical History  CAD s/p CABG 8/3 DM2 Diverticulosis GERD Hyperlipidemia Hypertension History of stroke Tobacco abuse  Significant Hospital Events: Including procedures, antibiotic start and stop dates in addition to other pertinent events   8/19 admitted, s/p pericardial window 8/20 PEA cardiac arrest, left pigtail chest tube placement, PEA cardiac arrest due to tamponade, bedside sternotomy with pericardial drains placed. 8/21 return to OR for overnight bleeding.  8/22 minimal chest tube output.  Atrial fibrillation, controled with amiodarone.  8/22 TEE showed EF 35% at baseline, which improved significantly with decreasing ECMO flow.  8/23 tolerated diuresis  8/24 mediastinal washout, removal of hematoma 8/25 ongoing bleeding issues from posterior mediastinum 8/29 decannulated and off ECMO, 5.5 Impella placed, L femoral HD cath placed and started on CRRT 8/30 hypoxia and hypotension followed by brief arrest, gentle L lateral chest compressions performed and ROSC in roughly 1 minute. 8/31 remains deeply sedated on ventilator with open chest.  No acute events overnight 9/1 no acute events overnight, remains on ventilator deep sedation.  Tolerating aggressive volume removal per CRRT 9/2 sternotomy closure 9/3 wean versed 9/4 FOB, low grade temps, notable thick mucus/frequent suctioning   Interim History / Subjective:  Tmax 99  in last 24 hours  On vanco / meropenem I/O 7.2L removed with HD, -2.8L in last 24 hours, 235 ml stool  On vent - PEEP 8 / FiO2 40% > low rate with high pressure PSV wean on dilaudid   Objective   Blood pressure 103/61, pulse 71, temperature 97.6 F (36.4 C), temperature source Oral, resp. rate 19, height _0  (1.702 m), weight 93.2 kg, SpO2 100 %. CVP:  [10 mmHg-31 mmHg] 11 mmHg  Vent Mode: PSV;CPAP FiO2 (%):  [40 %-100 %] 40 % Set Rate:  [20 bmp] 20 bmp Vt Set:  [530 mL] 530 mL PEEP:  [8 cmH20] 8 cmH20 Pressure Support:  [10 cmH20] 10 cmH20 Plateau Pressure:  [22 cmH20-25 cmH20] 23 cmH20   Intake/Output Summary (Last 24 hours) at 10/15/2021 1111 Last data filed at 10/15/2021 1000 Gross per 24 hour  Intake 4405.8 ml  Output 6275 ml  Net -1869.2 ml   Filed Weights   10/13/21 0500 10/14/21 0500 10/15/21 0433  Weight: 106.5 kg 101.3 kg 93.2 kg    Examination: General: critically ill appearing adult male lying in bed on vent in NAD HEENT: MM pink/moist, ETT, anicteric, pupils reactive  Neuro: opens eyes to voice, looks toward provider, nods no to pain, generalized weakness   CV: s1s2 difficult to auscultate with Impella hum, sternal wound VAC, NSR 70's PULM: non-labored at rest, lungs bilaterally clear GI: soft, bsx4 active  Extremities: warm/dry, 1+ dependent edema  Skin: no rashes or lesions.  Bilateral IJ TLC's   PCXR 9/5 >> images reviewed, lines in good position, lungs clear   Resolved Hospital Problem list   Acute RV failure > resolved  Assessment & Plan:   Hemorrhagic Pericarditis with Cardiac  Tamponade causing PEA arrest, s/p Emergent Sternotomy Open chest, washout performed > closed on 9/2 CAD s/p CABG 8/3 Paroxysmal afib -Impella, vasopressors per heart failure / TCTS  -anticoagulation per TCTS  -continue amiodarone  -statin  -consider line consolidation given fevers with removal of R IJ (left placed under sterile condition while in ICU)  Acute Hypoxemic and  Hypercarbic Respiratory Failure Thick pulmonary secretions -PRVC with LTVV  -VAP Prevention measures  -wean sedation, see below  -daily WUA / SBT  -follow up FOB from 9/4 > no growth >> -follow intermittent CXR   Acute Metabolic Encephalopathy Need for sedation for mechanical ventilation -PAD protocol with PRN versed  -add oxycodone PT, wean IV dilaudid.  Will need narcotic taper to avoid withdrawal.  -PT klonopin -delirium precautions  At risk for Mediastinitis (open chest) Low grade fever -continue mero/vanc -follow up BAL  -consider line consolidation / removal  -if continues to fever, consider LE duplex  -on SCD's currently  AKI -CRRT per Nephrology for volume removal  -Trend BMP / urinary output -Replace electrolytes as indicated  Acute Blood Loss Anemia > stable -trend CBC -transfuse for Hgb <7% or active bleeding   DM II -glucose range 173-211  -continue SSI, moderate scale  -levemir 50 units BID  -Novolog 12 units Q4   At Risk Malnutrition  -continue TF per Nutrition   Best Practice (right click and "Reselect all SmartList Selections" daily)  Diet/type: tubefeeds DVT prophylaxis: SCD GI prophylaxis: PPI Lines: Central line Foley:  Yes, and it is still needed Code Status:  full code Last date of multidisciplinary goals of care discussion [per primary]  Critical care time: 53 minutes    Noe Gens, MSN, APRN, NP-C, AGACNP-BC [facility] 10/15/2021, 11:32 AM   Please see [email or web address] for pager details.   From 7A-7P if no response, please call [phone] After hours, please call ELink [phone]

## 2021-10-16 DIAGNOSIS — R578 Other shock: Secondary | ICD-10-CM

## 2021-10-16 DIAGNOSIS — K567 Ileus, unspecified: Secondary | ICD-10-CM

## 2021-10-16 LAB — RENAL FUNCTION PANEL
Albumin: 2.7 g/dL — ABNORMAL LOW (ref 3.5–5.0)
Albumin: 2.7 g/dL — ABNORMAL LOW (ref 3.5–5.0)
Anion gap: 10 (ref 5–15)
Anion gap: 10 (ref 5–15)
BUN: 26 mg/dL — ABNORMAL HIGH (ref 8–23)
BUN: 24 mg/dL — ABNORMAL HIGH (ref 8–23)
CO2: 24 mmol/L (ref 22–32)
CO2: 23 mmol/L (ref 22–32)
Calcium: 8.9 mg/dL (ref 8.9–10.3)
Calcium: 8.5 mg/dL — ABNORMAL LOW (ref 8.9–10.3)
Chloride: 102 mmol/L (ref 98–111)
Chloride: 103 mmol/L (ref 98–111)
Creatinine, Ser: 1.53 mg/dL — ABNORMAL HIGH (ref 0.61–1.24)
Creatinine, Ser: 1.76 mg/dL — ABNORMAL HIGH (ref 0.61–1.24)
GFR, Estimated: 51 mL/min — ABNORMAL LOW (ref >60)
GFR, Estimated: 43 mL/min — ABNORMAL LOW (ref >60)
Glucose, Bld: 90 mg/dL (ref 70–99)
Glucose, Bld: 125 mg/dL — ABNORMAL HIGH (ref 70–99)
Phosphorus: 2.7 mg/dL (ref 2.5–4.6)
Phosphorus: 2.4 mg/dL — ABNORMAL LOW (ref 2.5–4.6)
Potassium: 4.8 mmol/L (ref 3.5–5.1)
Potassium: 4.4 mmol/L (ref 3.5–5.1)
Sodium: 136 mmol/L (ref 135–145)
Sodium: 136 mmol/L (ref 135–145)

## 2021-10-16 LAB — COOXEMETRY PANEL
Carboxyhemoglobin: 2.3 % — ABNORMAL HIGH (ref 0.5–1.5)
Methemoglobin: <0.7 % (ref 0.0–1.5)
O2 Saturation: 64.8 %
Total hemoglobin: 7.8 g/dL — ABNORMAL LOW (ref 12.0–16.0)

## 2021-10-16 LAB — CBC
HCT: 27.5 % — ABNORMAL LOW (ref 39.0–52.0)
Hemoglobin: 8.8 g/dL — ABNORMAL LOW (ref 13.0–17.0)
MCH: 29.4 pg (ref 26.0–34.0)
MCHC: 32 g/dL (ref 30.0–36.0)
MCV: 92 fL (ref 80.0–100.0)
Platelets: 600 10*3/uL — ABNORMAL HIGH (ref 150–400)
RBC: 2.99 MIL/uL — ABNORMAL LOW (ref 4.22–5.81)
RDW: 16.1 % — ABNORMAL HIGH (ref 11.5–15.5)
WBC: 16.3 10*3/uL — ABNORMAL HIGH (ref 4.0–10.5)
nRBC: 0.7 % — ABNORMAL HIGH (ref 0.0–0.2)

## 2021-10-16 LAB — MAGNESIUM: Magnesium: 2.4 mg/dL (ref 1.7–2.4)

## 2021-10-16 LAB — PROTIME-INR
INR: 1.3 — ABNORMAL HIGH (ref 0.8–1.2)
Prothrombin Time: 16.1 seconds — ABNORMAL HIGH (ref 11.4–15.2)

## 2021-10-16 LAB — C DIFFICILE (CDIFF) QUICK SCRN (NO PCR REFLEX)
C Diff antigen: NEGATIVE
C Diff interpretation: NOT DETECTED
C Diff toxin: NEGATIVE

## 2021-10-16 LAB — LACTATE DEHYDROGENASE: LDH: 292 U/L — ABNORMAL HIGH (ref 98–192)

## 2021-10-16 LAB — CULTURE, RESPIRATORY W GRAM STAIN: Culture: NO GROWTH

## 2021-10-16 LAB — GLUCOSE, CAPILLARY
Glucose-Capillary: 70 mg/dL (ref 70–99)
Glucose-Capillary: 116 mg/dL — ABNORMAL HIGH (ref 70–99)
Glucose-Capillary: 131 mg/dL — ABNORMAL HIGH (ref 70–99)
Glucose-Capillary: 122 mg/dL — ABNORMAL HIGH (ref 70–99)
Glucose-Capillary: 134 mg/dL — ABNORMAL HIGH (ref 70–99)

## 2021-10-16 LAB — PREPARE RBC (CROSSMATCH)

## 2021-10-16 MED ORDER — DEXTROSE 5 % IV SOLN
15.0000 mmol | Freq: Once | INTRAVENOUS | Status: AC
  Administered 2021-10-16: 15 mmol via INTRAVENOUS
  Filled 2021-10-16: qty 5

## 2021-10-16 MED ORDER — SODIUM CHLORIDE 0.9% IV SOLUTION: Freq: Once | INTRAVENOUS | Status: DC

## 2021-10-16 MED ORDER — PIVOT 1.5 CAL PO LIQD
1000.0000 mL | ORAL | Status: DC
Start: 2021-10-16 — End: 2021-10-18
  Administered 2021-10-16 – 2021-10-17 (×2): 1000 mL

## 2021-10-16 MED ORDER — OXYCODONE HCL 5 MG PO TABS
5.0000 mg | ORAL_TABLET | Freq: Four times a day (QID) | ORAL | Status: DC
  Administered 2021-10-16 – 2021-10-18 (×7): 5 mg
  Filled 2021-10-16 (×7): qty 1

## 2021-10-16 MED ORDER — HEPARIN SODIUM (PORCINE) 5000 UNIT/ML IJ SOLN
5000.0000 [IU] | Freq: Three times a day (TID) | INTRAMUSCULAR | Status: DC
  Administered 2021-10-16 – 2021-10-17 (×3): 5000 [IU] via SUBCUTANEOUS
  Filled 2021-10-16 (×3): qty 1

## 2021-10-16 NOTE — Progress Notes (Signed)
[facility] Progress Note  Name: EDENILSON AUSTAD MRN: [id number] DOB: [birth year]   Subjective:  Seen and examined on CRRT.  He had 6.1 liters UF over 9/5 with CRRT.  He has been anuric.  He has been on epi at 2 mcg/min and was briefly on vaso then this was stopped.  UF net neg 100-150 ml/hr.  He just got a PRN but has been following basic commands for RN.  RN reports that yesterday he had 2 liters of NG output which had evidence of blood.  He also had about a liter of stool.   Review of systems:   Unable to obtain 2/2 intubated and sedated    Intake/Output Summary (Last 24 hours) at 10/16/2021 0648 Last data filed at 10/16/2021 0600 Gross per 24 hour  Intake 3475.1 ml  Output 10291 ml  Net -6815.9 ml    Vitals:  Vitals:   10/16/21 0600 10/16/21 0615 10/16/21 0630 10/16/21 0645  BP:      Pulse: 78 78 80 78  Resp: 15 15 16  (!) 25  Temp: 97.9 F (36.6 C) 98.1 F (36.7 C) 98.1 F (36.7 C) 98.1 F (36.7 C)  TempSrc:      SpO2: 100% 100% 100% 100%  Weight:      Height:         Physical Exam:     General adult male in bed critically ill  HEENT normocephalic atraumatic  Neck supple trachea midline Lungs coarse mechanical breath sounds Heart S1S2; on pressors  Abdomen soft nontender nondistended Extremities 2+ edema diffusely Neuro - sedation currently running  Access: left femoral nontunneled catheter in place   Medications reviewed   Labs:     Latest Ref Rng & Units 10/16/2021    4:07 AM 10/15/2021    3:03 PM 10/15/2021    4:04 AM  BMP  Glucose 70 - 99 mg/dL 90  188  180   BUN 8 - 23 mg/dL 26  33  33   Creatinine 0.61 - 1.24 mg/dL 1.53  1.73  1.88   Sodium 135 - 145 mmol/L 136  136  138   Potassium 3.5 - 5.1 mmol/L 4.8  4.1  4.4   Chloride 98 - 111 mmol/L 102  103  101   CO2 22 - 32 mmol/L 24  24  24    Calcium 8.9 - 10.3 mg/dL 8.9  7.8  8.7      Assessment/Plan:   Pt is a 61 y.o. yo male  with history of HTN, HLD, DM, A-fib, stroke, CKD, CAD  multivessel dz status post CABG on 8/3 presented with shortness of breath, found to have cardiac tamponade, course complicated by cardiac arrest, use of ECMO, seen as a consultation for the evaluation of AKI and fluid volume management.   #Acute kidney injury on CKD IIIa, nonoliguric: Ischemic ATN due to cardiogenic shock/cardiac arrest and multiple hemodynamic changes.  Started CRRT on 8/28 for decreasing urine output and fluid/volume management - Continue CRRT.  UF net neg 100-150 ml/hr    - on all 4 K fluids - not on anticoagulation with CRRT and note concern for bleeding as above   #Cardiac tamponade/hemorrhagic pericarditis status post pericardial drain placement with subsequent ECMO placement.  decannulated  with placement of Impella. On colchicine per cardiology S/p closure of the sternum and mediastinal washout on 9/2.   #Cardiogenic shock/cardiac arrest: Was on ECMO before. Continue pressors per primary team    #Acute hypoxic respiratory failure: Currently intubated  and sedated.  Vent per pulmonary.  Optimize volume status with CRRT    #Acute blood loss anemia: Monitor hemoglobin and transfuse as needed by ICU team.  Aranesp 40 mcg weekly on Mondays started on 9/4   #Hypophosphatemia due to CRRT, replete sodium phosphate as needed  Disposition - continue ICU monitoring  Claudia Desanctis, MD 10/16/2021 6:59 AM

## 2021-10-16 NOTE — Progress Notes (Signed)
CT surgery PM rounds  Patient hemodynamically stable on P4 support Impella 5.5. Only inotrope is epinephrine 2.0 mcg  Plan removal of Impella LVAD in a.m.  Blood pressure 96/60, pulse 79, temperature (!) 96.6 F (35.9 C), resp. rate 17, height 5\' 7"  (1.702 m), weight 87.3 kg, SpO2 100 %.

## 2021-10-16 NOTE — Progress Notes (Signed)
CPT held at this time. Patient very agitated.

## 2021-10-16 NOTE — Progress Notes (Addendum)
Patient ID: [name], male   DOB: 05-07-1960, 61 y.o.   MRN: [id number]    Advanced Heart Failure Rounding Note   Subjective:    - 8/19 Pericardial window - 8/20 Cardiac arrest with tamponade -> Emergent bedside washout - 09/29/21 VA Cannulation - 09/30/21 Return to OR for mediastinal hemorrhage - 10/01/21 Developed AF -> amio - 10/02/21 TEE EF 25-30%  - 10/04/21 OR for washout. C/b continued bleeding overnight - 10/07/21 Placement of Impella 5.5 with washout, VA ECMO decannulation. Hypotensive with development of severe RV dysfunction after ECMO off and pressors titrated up. Multiple units of blood products. - 10/08/21 Brief PEA arrest. AFL with RVR >> S/p DCCV to SR, back in AFL shortly after - 8/31 Give 1UPRBCs  - 9/2 OR for chest closure - 9/3 Hypotensive overnight w/ SBPs in 80s. Febrile, mTemp 100.8. CRRT paused. VP increased to 0.04.  - 9/4 s/p bronchoscopy w/ BAL by PCCM, Cx NGTD  - 9/5 vomiting w/ large volume NGT output + watery/foul diarrhea, Tube feeds held. No signs of ileus on KUB   Remains intubated. Sedation off. Awake on vent and following commands.   On Epi 2. Impella at P-4, flow 2.0. Co-ox 65%.  On CRRT. 6.3L removed yesterday. Wt down an additional 13 lb.  Down 30 lb from admission wt. CVP 8-9. CRRT pulling overnight,  just switched to even.   Remains on Vanc + meropenum. WBC remains elevated, 15>>16K. BAL cx NGTD.   Amio gtt at 60/hr. NSR 70s     Echo 08/28: LV EF 55-60% with severe LVH, small ventricular cavity, RV appears to have near normal systolic function   Objective:     Vital Signs:   Temp:  [92.5 F (33.6 C)-98.1 F (36.7 C)] 98.1 F (36.7 C) (09/06 0700) Pulse Rate:  [61-81] 79 (09/06 0700) Resp:  [14-30] 16 (09/06 0700) BP: (86-122)/(59-66) 96/60 (09/06 0030) SpO2:  [100 %] 100 % (09/06 0805) Arterial Line BP: (86-157)/(57-81) 119/67 (09/06 0700) FiO2 (%):  [40 %] 40 % (09/06 0312) Weight:  [87.3 kg] 87.3 kg (09/06 0436) Last BM Date :  10/15/21  Weight change: Filed Weights   10/14/21 0500 10/15/21 0433 10/16/21 0436  Weight: 101.3 kg 93.2 kg 87.3 kg    Intake/Output:   Intake/Output Summary (Last 24 hours) at 10/16/2021 0815 Last data filed at 10/16/2021 0700 Gross per 24 hour  Intake 3246.04 ml  Output 9790 ml  Net -6543.96 ml    PHYSICAL EXAM: CVP 8-9  General: awake on vent, following commands. No distress  HEENT: normal + ETT, + cortrak Neck: supple. JVD 9-10 cm. Carotids 2+ bilat; no bruits. No lymphadenopathy or thyromegaly appreciated. Cor: PMI nondisplaced. Regular rate & rhythm. No rubs, gallops or murmurs. + sternal dressing/ wound vac Lungs: intubated and clear  Abdomen: soft, nontender, nondistended. No hepatosplenomegaly. No bruits or masses. Hypotactive bowel sounds. Extremities: no cyanosis, clubbing, rash, 1+ b/l LE edema Neuro: intubated. Awake on vent and following commands    Telemetry: Sinus 70s Personally reviewed   Labs: Basic Metabolic Panel: Recent Labs  Lab 10/12/21 0359 10/12/21 0825 10/13/21 0400 10/13/21 1154 10/14/21 0235 10/14/21 1702 10/15/21 0404 10/15/21 1503 10/16/21 0407  NA 138   < > 135   < > 137 135 138 136 136  K 4.2   < > 4.9   < > 5.3* 5.0 4.4 4.1 4.8  CL 104   < > 104   < > 104 104 101 103 102  CO2 27   < > 23   < > _0 GLUCOSE 91   < > 264*   < > 229* 263* 180* 188* 90  BUN 27*   < > 31*   < > 33* 32* 33* 33* 26*  CREATININE 1.88*   < > 2.01*   < > 2.15* 2.02* 1.88* 1.73* 1.53*  CALCIUM 8.3*   < > 8.0*   < > 8.4* 8.4* 8.7* 7.8* 8.9  MG 2.5*  --  2.5*  --  2.4  --  2.7*  --  2.4  PHOS 2.7   < > 3.5   < > 4.4 3.6 3.1 3.7 2.7   < > = values in this interval not displayed.    Liver Function Tests: Recent Labs  Lab 10/14/21 0235 10/14/21 1702 10/15/21 0404 10/15/21 1503 10/16/21 0407  ALBUMIN 2.2* 2.4* 2.5* 2.7* 2.7*   No results for input(s): "LIPASE", "AMYLASE" in the last 168 hours. No results for input(s): "AMMONIA" in the last  168 hours.  CBC: Recent Labs  Lab 10/13/21 0400 10/14/21 0235 10/15/21 0404 10/15/21 1519 10/16/21 0407  WBC 13.9* 13.0* 16.2* 15.4* 16.3*  HGB 9.7* 9.6* 9.1* 8.4* 8.8*  HCT 30.4* 30.6* 30.0* 27.8* 27.5*  MCV 93.3 95.0 94.9 95.2 92.0  PLT 324 413* 597* 603* 600*    Cardiac Enzymes: No results for input(s): "CKTOTAL", "CKMB", "CKMBINDEX", "TROPONINI" in the last 168 hours.  BNP: BNP (last 3 results) Recent Labs    12/24/20 0651 09/28/21 0405  BNP 412.0* 826.6*    ProBNP (last 3 results) No results for input(s): "PROBNP" in the last 8760 hours.    Other results:  Imaging: DG CHEST PORT 1 VIEW  Result Date: 10/15/2021 CLINICAL DATA:  Vomiting EXAM: PORTABLE CHEST 1 VIEW COMPARISON:  10/15/2021, 10/14/2021, 10/13/2021, 10/12/2021 FINDINGS: Endotracheal tube tip about 4.9 cm superior to the carina. Right IJ central venous catheter tip over the SVC. Left ventricular assist device similar in position. Left IJ central venous catheter tip over the SVC origin. Esophageal tube tip below the diaphragm. Post sternotomy changes. Cardiomegaly with mild airspace disease at the left lung base, slowly improving. Aortic atherosclerosis. No pneumothorax. IMPRESSION: 1. Similar positioning of support lines and tubes as above 2. Cardiomegaly 3. Mild airspace disease at the left lung base, not significantly changed as compared with radiograph earlier today slightly improved as compared with radiographs several days ago. Electronically Signed   By: Donavan Foil M.D.   On: 10/15/2021 16:09   DG Abd Portable 1V  Result Date: 10/15/2021 CLINICAL DATA:  Vomiting EXAM: PORTABLE ABDOMEN - 1 VIEW COMPARISON:  10/04/2021 FINDINGS: Streaky opacities at the left lung base. Esophageal tube tip overlies the stomach. Feeding tube tip incompletely visualized but tubing visualized at the duodenum. Upper gas pattern is unremarkable IMPRESSION: 1. Esophageal tube tip overlies the stomach. Feeding tube tip  incompletely included on the image but tubing is visualized at least to the second portion of duodenum Electronically Signed   By: Donavan Foil M.D.   On: 10/15/2021 16:05   DG CHEST PORT 1 VIEW  Result Date: 10/15/2021 CLINICAL DATA:  Endotracheal tube present. Open heart surgery. Impella heart pump. EXAM: PORTABLE CHEST 1 VIEW COMPARISON:  AP chest 10/14/2021 FINDINGS: Status post median sternotomy. Left ventricular assist device is not significantly changed. Right internal jugular central venous catheter sheath tip again overlies the central superior vena cava. Left internal jugular central venous catheter tip again overlies  the junction of the left brachiocephalic vein and the superior vena cava. Cardiac silhouette is again moderately enlarged. Mediastinal contours are within normal limits. Mild-to-moderate calcification within the aortic arch. The lungs are clear. Unchanged left retrocardiac heterogeneous opacification. No definite pleural effusion. No pneumothorax. No acute skeletal abnormality. IMPRESSION: 1. Support apparatus not significantly changed. 2. Stable moderately enlarged cardiac silhouette. 3. Left retrocardiac opacity, likely atelectasis, unchanged. Electronically Signed   By: Yvonne Kendall M.D.   On: 10/15/2021 08:20     Medications:     Scheduled Medications:  sodium chloride   Intravenous Once   atorvastatin  80 mg Per Tube Daily   Chlorhexidine Gluconate Cloth  6 each Topical Daily   clonazePAM  1 mg Per Tube Q8H   darbepoetin (ARANESP) injection - NON-DIALYSIS  40 mcg Subcutaneous Q Mon-1800   insulin aspart  0-15 Units Subcutaneous Q4H   metoCLOPramide (REGLAN) injection  10 mg Intravenous Q6H   mouth rinse  15 mL Mouth Rinse Q2H   oxyCODONE  5 mg Per Tube Q6H   pantoprazole (PROTONIX) IV  40 mg Intravenous Q12H   QUEtiapine  50 mg Per Tube BID   sodium chloride flush  10-40 mL Intracatheter Q12H   sodium chloride flush  3 mL Intravenous Q12H   sodium chloride  HYPERTONIC  4 mL Nebulization BID    Infusions:   prismasol BGK 4/2.5 400 mL/hr at 10/15/21 2053    prismasol BGK 4/2.5 400 mL/hr at 10/15/21 2145   sodium chloride 10 mL/hr at 10/16/21 0700   albumin human Stopped (10/15/21 1901)   amiodarone 60 mg/hr (10/16/21 0700)   dextrose 50 mL/hr at 10/16/21 0700   epinephrine 2 mcg/min (10/16/21 0700)   feeding supplement (PIVOT 1.5 CAL)     HYDROmorphone 2 mg/hr (10/16/21 0700)   meropenem (MERREM) IV Stopped (10/16/21 0220)   prismasol BGK 4/2.5 1,800 mL/hr at 10/16/21 0658   sodium bicarbonate 25 mEq (Impella PURGE) in dextrose 5 % 1000 mL bag     vancomycin Stopped (10/15/21 2151)   vasopressin Stopped (10/16/21 0244)    PRN Medications: sodium chloride, albumin human, artificial tears, dextrose, heparin, HYDROmorphone, ipratropium-albuterol, midazolam, ondansetron (ZOFRAN) IV, sodium chloride, sodium chloride flush, sodium chloride flush   Assessment/Plan:    1. Shock - mixed cardiogenic/hemorrhagic -> VA ECMO -> decannulated on 8/28 to Impella 5.5 - Echo 08/28: Underfilled LV, EF 55-60% with severe LVH and near normal RV systolic function.  - Impella remains at P4 Flow 2.0L - On Epi 2. CO-OX 65%  this am. - Wt down 30 lb. CVP 8-9. Keep CRRT running even  - Continue EPI at current rate - Plan Impella extraction tomorrow    2. Cardiac arrest (PEA/bradycardic) - 8/19 in setting of tamponade - PEA arrest again on 08/28  3. Cardiac tamponade with emergent bedside sternotomy  - Diffuse epicardial bleeding with post-op Dresslers - return to OR 8/21 and 8/24 for washouts.  - Washout 8/28 in OR. Multiple units of blood products in OR.   - CT/MT minimal blood output.  - Chest closed on 9/2  4. Acute hypoxemic respiratory failure - Off ECMO.  - Vent support per CCM - Wean as able. Can u/s RLL to look for effusion - remains on Vanc + meropenum - s/p bronchoscopy w/ BAL 9/4. Cx NGTD   5. AKI due to ATN - CRRT started 08/28.  Volume status improving - Keep CRRT running even today  - Nephrology following  6. 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 Continues on colchicine.  7. 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 ASA held as above  8. DM2 - continue SSI + levemir  9. PAF/AFL - Tolerates poorly.  - Recurrent AFL. S/p DCCV  to SR 08/29.  - SR today.   - continue amio 60/hr - No AC for now  10. ID - On vancomycin/meropenem with recent open chest.   11. Neuro -off sedation, following commands   12. FEN - TFs ongoing @ goal  13. Hyperkalemia - resolved, K 4.8 today  - continue CRRT   14. GI  - vomiting w/ large volume NGT output + watery/foul diarrhea. KUB negative for ileus - w/ persistent leukocytosis in setting of broad spectrum abx use, ? C-Diff. Primary team to order - ? Stopping Reglan    Length of Stay: [place] PA-C  10/16/2021, 8:15 AM  Advanced Heart Failure Team Pager [phone] (M-F; [place])  Please contact [facility] for night-coverage after hours (4p -7a ) and weekends on [email or web address]   Agree with above.   Remains intubated/sedated. Will awaken and follow commands.  Having vomitting and diffuse watery diarrhea.    Remains on CVVHD. Impella 5.5 @ P-4 with 1.7L flow. On epi 2. Off VP  CVP 8-10. Co-ox 64%  Has mucous plugs at times  General:  intubated/sedated HEENT: normal + ETT Neck: supple. RIJ HD  Cor: Praveena in place. Impella site ok Regular rate & rhythm. No rubs, gallops or murmurs. Lungs: clear Abdomen: soft, nontender, + distended. No hepatosplenomegaly. No bruits or masses. Good bowel sounds. Extremities: no cyanosis, clubbing, rash, 2+ edema + SCDs Neuro: intubated/sedated  Continue current support. With diarrhea will keep CRRT even. Test for C. Diff. Continue epi at 2  Will need trach and Impella removal tomorrow in OR.   Impella flows stable today.   CRITICAL CARE Performed by: Glori Bickers  Total critical care time: 40 minutes  Critical care time was exclusive of separately billable procedures and treating other patients.  Critical care was necessary to treat or prevent imminent or life-threatening deterioration.  Critical care was time spent personally by me (independent of midlevel providers or residents) on the following activities: development of treatment plan with patient and/or surrogate as well as nursing, discussions with consultants, evaluation of patient's response to treatment, examination of patient, obtaining history from patient or surrogate, ordering and performing treatments and interventions, ordering and review of laboratory studies, ordering and review of radiographic studies, pulse oximetry and re-evaluation of patient's condition.   Glori Bickers, MD  10:25 AM

## 2021-10-16 NOTE — Progress Notes (Signed)
NAME:  [name], MRN:  [id number], DOB:  1960/06/15, LOS: 17 ADMISSION DATE:  09/28/2021, CONSULTATION DATE:  8/20 REFERRING MD:  Kipp Brood, CHIEF COMPLAINT:  dyspnea   History of Present Illness:  61 y/o male underwent CABG on 8/3, readmitted on 8/19 in setting of dyspnea with a pericardial effusion requiring pericardial window via bedside sternotomy.  Has had pleural effusions, developed PEA arrest on 8/20, briefly required VA ECMO which was discontinued on 8/29, impella placed, started on CRRT.  Hypoxemia, repeat cardiac arrest on 8/30.    Pertinent  Medical History  CAD s/p CABG 8/3 DM2 Diverticulosis GERD Hyperlipidemia Hypertension History of stroke Tobacco Abuse  Significant Hospital Events: Including procedures, antibiotic start and stop dates in addition to other pertinent events   8/19 admitted, s/p pericardial window 8/20 PEA cardiac arrest, left pigtail chest tube placement, PEA cardiac arrest due to tamponade, bedside sternotomy with pericardial drains placed. 8/21 return to OR for overnight bleeding.  8/22 minimal chest tube output.  Atrial fibrillation, controled with amiodarone.  8/22 TEE showed EF 35% at baseline, which improved significantly with decreasing ECMO flow.  8/23 tolerated diuresis  8/24 mediastinal washout, removal of hematoma 8/25 ongoing bleeding issues from posterior mediastinum 8/29 decannulated and off ECMO, 5.5 Impella placed, L femoral HD cath placed and started on CRRT 8/30 hypoxia and hypotension followed by brief arrest, gentle L lateral chest compressions performed and ROSC in roughly 1 minute. 8/31 remains deeply sedated on ventilator with open chest.  No acute events overnight 9/1 no acute events overnight, remains on ventilator deep sedation.  Tolerating aggressive volume removal per CRRT 9/2 sternotomy closure 9/3 wean versed 9/4 FOB, low grade temps, notable thick mucus/frequent suctioning 9/5 Vomiting, ?mucus plugging. PSV 5-6  hours on 10/5  Interim History / Subjective:  Hypoglycemia event overnight with TF on hold  Vent -40% / PEEP 8  Glucose range 70-116 Afebrile / WBC 16.3  I/O 6.3L removed with HD, -6.7L in last 24 hours, 2.4L stool (-11L for admit) Pending return to OR for Impella removal   Objective   Blood pressure 96/60, pulse 79, temperature 98.1 F (36.7 C), resp. rate 16, height _0  (1.702 m), weight 87.3 kg, SpO2 100 %. CVP:  [8 mmHg-20 mmHg] 14 mmHg  Vent Mode: PRVC FiO2 (%):  [40 %] 40 % Set Rate:  [16 bmp] 16 bmp Vt Set:  [530 mL] 530 mL PEEP:  [8 cmH20] 8 cmH20 Pressure Support:  [10 cmH20] 10 cmH20 Plateau Pressure:  [17 cmH20-23 cmH20] 17 cmH20   Intake/Output Summary (Last 24 hours) at 10/16/2021 0740 Last data filed at 10/16/2021 0700 Gross per 24 hour  Intake 3445.26 ml  Output 10181 ml  Net -6735.74 ml   Filed Weights   10/14/21 0500 10/15/21 0433 10/16/21 0436  Weight: 101.3 kg 93.2 kg 87.3 kg    Examination: General: critically ill appearing adult male lying in bed on vent in NAD HEENT: MM pink/moist, ETT, anicteric, pupils reactive  Neuro: sedate, awakens to voice, nods "no" when asked if he is hurting CV: s1s2 difficult to appreciate with impella hum, R IJ TLC  PULM: non-labored at rest on vent, lungs bilaterally clear with good air entry GI: soft, bsx4 active  Extremities: warm/dry, trace generalized dependent edema  Skin: no rashes or lesions  Resolved Hospital Problem list   Acute RV failure > resolved  Assessment & Plan:   Hemorrhagic Pericarditis with Cardiac Tamponade causing PEA arrest, s/p Emergent Sternotomy Open chest, washout  performed > closed on 9/2 CAD s/p CABG 8/3 Paroxysmal afib -impella, vasopressors per CHF / TCTS  -pending device removal in am 9/7 and possible trach placement  -continue amiodarone, statin -tele monitoring   Acute Hypoxemic and Hypercarbic Respiratory Failure Thick pulmonary secretions -PRVC with LTVV  -wean PEEP / FiO2  for sats >90% -daily WUA / SBT -tracheal aspirate 9/4 negative  -follow intermittent CXR  -duoneb Q4 PRN  -discussed trach with wife, [name], via phone 9/6. Reviewed risks/benefits, plan for coordination while in OR   Acute Metabolic Encephalopathy Need for sedation for mechanical ventilation -PAD protocol with dilaudid & PRN versed  -PT klonopin, oxycodone  -delirium precautions   At risk for Mediastinitis (open chest) Low grade fever Fever has defervesced  -continue abx > vanco + meropenem  -BAL negative 9/4  -line removed 9/5, residual L IJ in place  -SCD's -C-diff assessment / precautions   AKI -CRRT per Nephrology, goal even to slightly negative  -Trend BMP / urinary output -Replace electrolytes as indicated  Vomiting  Noted 9/6, mild bloody drainage from gastric tube thought to be suction trauma -BID PPI  -monitor for further bloody drainage  -NGT to suction, post pyloric dobhoff with TF   Acute Blood Loss Anemia > stable Thrombocytosis  -trend CBC  -transfuse for Hgb <7% or active bleeding   DM II Episode of hypoglycemia while TF off 9/5 -SSI, moderate scale  -hold long acting & TF coverage   At Risk Malnutrition  -resume TF per Nutrition   Best Practice (right click and "Reselect all SmartList Selections" daily)  Diet/type: tubefeeds DVT prophylaxis: prophylactic heparin  GI prophylaxis: PPI Lines: Central line Foley:  Yes, and it is still needed Code Status:  full code Last date of multidisciplinary goals of care discussion [per primary]  Wife updated via phone 9/6 on plan of care. Reviewed risks/benefits of trach, progression of trach needs etc.    Critical care time: 35 minutes     Noe Gens, MSN, APRN, NP-C, AGACNP-BC  Pulmonary & Critical Care 10/16/2021, 7:40 AM   Please see [email or web address] for pager details.   From 7A-7P if no response, please call [phone] After hours, please call ELink [phone]

## 2021-10-16 NOTE — Progress Notes (Signed)
Impella turned down to P3 per Bensimhon MD.

## 2021-10-16 NOTE — Progress Notes (Signed)
This chaplain is present for F/U spiritual care. The chaplain reviewed the charts and was updated by the Pt. RN-Kara before the visit. The chaplain understands the Pt. sedation has is reduced.  The Pt. nods appropriately with the chaplain as she introduces herself and invites the Pt. to join her in prayer.  This chaplain is available for F/U spiritual care as needed.  Chaplain Sallyanne Kuster [phone]

## 2021-10-16 NOTE — H&P (View-Only) (Signed)
4 Days Post-Op Procedure(s) (LRB): MEDIASTINAL WASHOUT (N/A) STERNAL CLOSURE (N/A) TRANSESOPHAGEAL ECHOCARDIOGRAM (TEE) (N/A) Subjective:  Hemodynamically stable overnight. On epi 3, vaso off and on. Co-ox 65%, CVP 12  -6736/24 hrs but 1385 was stool and 2425 NG. Wt down to 192.  Admission wt 222.  Objective: Vital signs in last 24 hours: Temp:  [92.5 F (33.6 C)-98.1 F (36.7 C)] 98.1 F (36.7 C) (09/06 0700) Pulse Rate:  [61-81] 79 (09/06 0700) Cardiac Rhythm: Normal sinus rhythm (09/05 2000) Resp:  [14-30] 16 (09/06 0700) BP: (86-122)/(59-66) 96/60 (09/06 0030) SpO2:  [100 %] 100 % (09/06 0700) Arterial Line BP: (86-157)/(57-81) 119/67 (09/06 0700) FiO2 (%):  [40 %] 40 % (09/06 0312) Weight:  [87.3 kg] 87.3 kg (09/06 0436)  Hemodynamic parameters for last 24 hours: CVP:  [8 mmHg-20 mmHg] 14 mmHg  Intake/Output from previous day: 09/05 0701 - 09/06 0700 In: 3445.3 [I.V.:1603.5; NG/GT:865; IV Piggyback:765.2] Out: 46962 [Emesis/NG output:2425; Drains:20; XBMWU:1324] Intake/Output this shift: No intake/output data recorded.  General appearance: intubated on vent. Anasarca resolved Neurologic: more alert, nodding to questions this am. Still on dilaudid drip. Heart: regular rate and rhythm, S1, S2 normal, no murmur Lungs: clear to auscultation bilaterally Abdomen: soft, non-tender; bowel sounds present. Extremities: edema minimal Wound: chest dressing intact.  Lab Results: Recent Labs    10/15/21 1519 10/16/21 0407  WBC 15.4* 16.3*  HGB 8.4* 8.8*  HCT 27.8* 27.5*  PLT 603* 600*   BMET:  Recent Labs    10/15/21 1503 10/16/21 0407  NA 136 136  K 4.1 4.8  CL 103 102  CO2 24 24  GLUCOSE 188* 90  BUN 33* 26*  CREATININE 1.73* 1.53*  CALCIUM 7.8* 8.9    PT/INR:  Recent Labs    10/16/21 0407  LABPROT 16.1*  INR 1.3*   ABG    Component Value Date/Time   PHART 7.389 10/09/2021 0419   HCO3 26.7 10/09/2021 1429   TCO2 23 10/12/2021 0825   ACIDBASEDEF  2.0 10/07/2021 1814   O2SAT 64.8 10/16/2021 0407   CBG (last 3)  Recent Labs    10/15/21 2342 10/16/21 0406 10/16/21 0741  GLUCAP 95 70 116*    Assessment/Plan:  Hemodynamically stable on low dose vasopressor and Impella at P4. Plan to remove in OR tomorrow am.  Vomiting yesterday with large amount of NG output but no signs of ileus on KUB and passing a lot of stool that is reportedly foul smelling and watery per nurses. May need to check for C. Diff. He has mild leukocytosis, has been on broad spectrum antibiotics. Abd benign. Will resume tube feeds per duodenal feeding tube at 30/hr. Keep NG to low intermittent suction. NG output is dark, looks bloody this morning. On PPI bid. Hgb stable.  Hypoglycemia overnight due to stopping tube feeds with insulin on board. Will continue SSI for now as tube feeds restarted and resume Levemir and tube feed coverage as needed.  He looks fairly dried out on exam and wt is 30 lbs below admission wt. Would keep CRRT even at this point.  Will see what CCM wants to do about trach.   LOS: 18 days    [name] 10/16/2021

## 2021-10-16 NOTE — Progress Notes (Signed)
4 Days Post-Op Procedure(s) (LRB): MEDIASTINAL WASHOUT (N/A) STERNAL CLOSURE (N/A) TRANSESOPHAGEAL ECHOCARDIOGRAM (TEE) (N/A) Subjective:  Hemodynamically stable overnight. On epi 3, vaso off and on. Co-ox 65%, CVP 12  -6736/24 hrs but 1385 was stool and 2425 NG. Wt down to 192.  Admission wt 222.  Objective: Vital signs in last 24 hours: Temp:  [92.5 F (33.6 C)-98.1 F (36.7 C)] 98.1 F (36.7 C) (09/06 0700) Pulse Rate:  [61-81] 79 (09/06 0700) Cardiac Rhythm: Normal sinus rhythm (09/05 2000) Resp:  [14-30] 16 (09/06 0700) BP: (86-122)/(59-66) 96/60 (09/06 0030) SpO2:  [100 %] 100 % (09/06 0700) Arterial Line BP: (86-157)/(57-81) 119/67 (09/06 0700) FiO2 (%):  [40 %] 40 % (09/06 0312) Weight:  [87.3 kg] 87.3 kg (09/06 0436)  Hemodynamic parameters for last 24 hours: CVP:  [8 mmHg-20 mmHg] 14 mmHg  Intake/Output from previous day: 09/05 0701 - 09/06 0700 In: 3445.3 [I.V.:1603.5; NG/GT:865; IV Piggyback:765.2] Out: 85631 [Emesis/NG output:2425; Drains:20; SHFWY:6378] Intake/Output this shift: No intake/output data recorded.  General appearance: intubated on vent. Anasarca resolved Neurologic: more alert, nodding to questions this am. Still on dilaudid drip. Heart: regular rate and rhythm, S1, S2 normal, no murmur Lungs: clear to auscultation bilaterally Abdomen: soft, non-tender; bowel sounds present. Extremities: edema minimal Wound: chest dressing intact.  Lab Results: Recent Labs    10/15/21 1519 10/16/21 0407  WBC 15.4* 16.3*  HGB 8.4* 8.8*  HCT 27.8* 27.5*  PLT 603* 600*   BMET:  Recent Labs    10/15/21 1503 10/16/21 0407  NA 136 136  K 4.1 4.8  CL 103 102  CO2 24 24  GLUCOSE 188* 90  BUN 33* 26*  CREATININE 1.73* 1.53*  CALCIUM 7.8* 8.9    PT/INR:  Recent Labs    10/16/21 0407  LABPROT 16.1*  INR 1.3*   ABG    Component Value Date/Time   PHART 7.389 10/09/2021 0419   HCO3 26.7 10/09/2021 1429   TCO2 23 10/12/2021 0825   ACIDBASEDEF  2.0 10/07/2021 1814   O2SAT 64.8 10/16/2021 0407   CBG (last 3)  Recent Labs    10/15/21 2342 10/16/21 0406 10/16/21 0741  GLUCAP 95 70 116*    Assessment/Plan:  Hemodynamically stable on low dose vasopressor and Impella at P4. Plan to remove in OR tomorrow am.  Vomiting yesterday with large amount of NG output but no signs of ileus on KUB and passing a lot of stool that is reportedly foul smelling and watery per nurses. May need to check for C. Diff. He has mild leukocytosis, has been on broad spectrum antibiotics. Abd benign. Will resume tube feeds per duodenal feeding tube at 30/hr. Keep NG to low intermittent suction. NG output is dark, looks bloody this morning. On PPI bid. Hgb stable.  Hypoglycemia overnight due to stopping tube feeds with insulin on board. Will continue SSI for now as tube feeds restarted and resume Levemir and tube feed coverage as needed.  He looks fairly dried out on exam and wt is 30 lbs below admission wt. Would keep CRRT even at this point.  Will see what CCM wants to do about trach.   LOS: 18 days    [name] 10/16/2021

## 2021-10-16 NOTE — Progress Notes (Signed)
Nutrition Follow-up  DOCUMENTATION CODES:   Not applicable  INTERVENTION:   Tube Feeding via Cortrak: recommend increasing TF back to goal as tolerated Pivot 1.5 at 65 ml/hr This provides 146 g of protein, 2340 kcals, 1186 mL of free water  NUTRITION DIAGNOSIS:   Inadequate oral intake related to acute illness as evidenced by NPO status.  Being addressed  GOAL:   Patient will meet greater than or equal to 90% of their needs Progressing  MONITOR:   Vent status, Labs, Weight trends, TF tolerance  REASON FOR ASSESSMENT:   Ventilator    ASSESSMENT:   61 yo male admitted with acute on chronic CHF with recent CABG, symptomatic bradycardia and hypotension. PMH includes DM, CAD s/p recent CABG x 5, CKD 3, CVA    8/19 Pericardial window, evacuation of pericardial and pleural effusion 8/20 Intubated, Bedside mediastinal re-exploration, cardiac tamponade, mediastinal clot; PEA arrest due to tamponade, VA ECMO cannulation 8/21 Return to OR for bleeding, mediastinal re-exploration, Cortrak placed-gastric 8/23 TEE with EF 25-30% 8/25 Cortrak advanced to Post-Pyloric position 8/28 VA ECMO decannulation, Impella placed, mediastinal washout, CRRT initiated 8/29 Brief arrest 9/02 OR: mediastinal washout, sternal closure  Pt remains on vent support. Impella at P4, low dose pressor support. Noted plan to return to OR tomorrow for trach placement and Impella removal  Pt tolerating post pyloric feedings until yesterday when pt experienced large volume of emesis-reports that this was TF mixed with bile. NG tube placed for decompression. Abd xray performed with indicated Cortrak is still well post pyloric. No acute issues noted on films.  Pt also with increase in stool, watery and smelly. C.diff checked but negative.  NG output this AM appears bloody, RN is aware and reports team is aware also.   TF resumed today at 30 ml/hr. Pt required D10 infusion overnight for hypoglycemia while TF on  hold.   Noted pt with newly documented stage II PI to sacrum.   Labs: reviewed Meds: aranesp,  ss novolog, IV reglan 1o mg q 6 hours  Diet Order:   Diet Order             Diet NPO time specified  Diet effective midnight           Diet NPO time specified  Diet effective now                   EDUCATION NEEDS:   Not appropriate for education at this time  Skin:  Skin Assessment: Skin Integrity Issues: Skin Integrity Issues:: Stage II Stage II: sacrum Incisions: open chest Other: dehisced wound (previous surgical incision) to R medial pretibial  Last BM:  1.5 L in 24 hours via rectal tube  Height:   Ht Readings from Last 1 Encounters:  09/29/21 5\' 7"  (1.702 m)    Weight:   Wt Readings from Last 1 Encounters:  10/16/21 87.3 kg    Ideal Body Weight:     BMI:  Body mass index is 30.14 kg/m.  Estimated Nutritional Needs:   Kcal:  2000-2200 kcals  Protein:  130-155 g  Fluid:  1.8 L  Kerman Passey MS, RDN, LDN, CNSC Registered Dietitian 3 Clinical Nutrition RD Pager and On-Call Pager Number Located in [place]

## 2021-10-16 NOTE — Progress Notes (Signed)
Patient has esophageal temperature probe. Some measurements may be inaccurate due to tube feedings.

## 2021-10-16 NOTE — Progress Notes (Signed)
[facility] Progress Note Patient Name: [name] DOB: 1960-06-08 MRN: [id number]   Date of Service  10/16/2021  HPI/Events of Note  CBG 70 mg / dl.  eICU Interventions  D 10 % water gtt rate increased to 50 ml / hour.        [name] [name] 10/16/2021, 4:20 AM

## 2021-10-16 NOTE — Anesthesia Preprocedure Evaluation (Addendum)
Anesthesia Evaluation  Patient identified by MRN, date of birth, ID band Patient awake    Reviewed: Allergy & Precautions, NPO status , Patient's Chart, lab work & pertinent test results  Airway Mallampati: Intubated  TM Distance: >3 FB Neck ROM: Full    Dental  (+) Teeth Intact, Dental Advisory Given   Pulmonary neg pulmonary ROS, Patient abstained from smoking., former smoker,    breath sounds clear to auscultation   + intubated    Cardiovascular hypertension, + angina + CAD, + Past MI, + Cardiac Stents and +CHF  Normal cardiovascular exam Rhythm:Regular Rate:Normal  Shock - mixed cardiogenic/hemorrhagic -> VA ECMO -> decannulated on 8/28 to Impella 5.5  - Echo 08/28: Underfilled LV, EF 55-60% with severe LVH and near normal RV systolic function.    Neuro/Psych CVA    GI/Hepatic Neg liver ROS, GERD  ,  Endo/Other  diabetesObesity   Renal/GU ARF and DialysisRenal disease     Musculoskeletal negative musculoskeletal ROS (+)   Abdominal   Peds  Hematology  (+) Blood dyscrasia, anemia ,   Anesthesia Other Findings   Reproductive/Obstetrics                            Anesthesia Physical Anesthesia Plan  ASA: 4  Anesthesia Plan: General   Post-op Pain Management:    Induction: Inhalational  PONV Risk Score and Plan: 2 and Treatment may vary due to age or medical condition  Airway Management Planned: Oral ETT  Additional Equipment: Arterial line, CVP and TEE  Intra-op Plan:   Post-operative Plan: Post-operative intubation/ventilation  Informed Consent: I have reviewed the patients History and Physical, chart, labs and discussed the procedure including the risks, benefits and alternatives for the proposed anesthesia with the patient or authorized representative who has indicated his/her understanding and acceptance.       Plan Discussed with: CRNA  Anesthesia Plan Comments:  (TEE FOR INTRA-OP MONITORING ONLY)      Anesthesia Quick Evaluation

## 2021-10-17 ENCOUNTER — Encounter (HOSPITAL_COMMUNITY)
Admission: EM | Disposition: A | Discharge status: Skilled Nursing Facility | Payer: Self-pay | Source: Home / Self Care | Attending: Surgery

## 2021-10-17 ENCOUNTER — Inpatient Hospital Stay (HOSPITAL_COMMUNITY): Payer: Self-pay

## 2021-10-17 ENCOUNTER — Inpatient Hospital Stay (HOSPITAL_COMMUNITY): Payer: Self-pay | Admitting: Certified Registered Nurse Anesthetist

## 2021-10-17 ENCOUNTER — Other Ambulatory Visit: Payer: Self-pay

## 2021-10-17 DIAGNOSIS — I509 Heart failure, unspecified: Secondary | ICD-10-CM

## 2021-10-17 DIAGNOSIS — Z87891 Personal history of nicotine dependence: Secondary | ICD-10-CM

## 2021-10-17 DIAGNOSIS — Z930 Tracheostomy status: Secondary | ICD-10-CM

## 2021-10-17 DIAGNOSIS — I25119 Atherosclerotic heart disease of native coronary artery with unspecified angina pectoris: Secondary | ICD-10-CM

## 2021-10-17 DIAGNOSIS — D649 Anemia, unspecified: Secondary | ICD-10-CM

## 2021-10-17 DIAGNOSIS — I252 Old myocardial infarction: Secondary | ICD-10-CM

## 2021-10-17 DIAGNOSIS — I110 Hypertensive heart disease with heart failure: Secondary | ICD-10-CM

## 2021-10-17 HISTORY — PX: TEE WITHOUT CARDIOVERSION: SHX5443

## 2021-10-17 HISTORY — PX: REMOVAL OF IMPELLA LEFT VENTRICULAR ASSIST DEVICE: SHX6556

## 2021-10-17 HISTORY — PX: PERCUTANEOUS TRACHEOSTOMY: SHX5288

## 2021-10-17 LAB — RENAL FUNCTION PANEL
Albumin: 2.5 g/dL — ABNORMAL LOW (ref 3.5–5.0)
Albumin: 2.5 g/dL — ABNORMAL LOW (ref 3.5–5.0)
Anion gap: 10 (ref 5–15)
Anion gap: 9 (ref 5–15)
BUN: 20 mg/dL (ref 8–23)
BUN: 22 mg/dL (ref 8–23)
CO2: 24 mmol/L (ref 22–32)
CO2: 23 mmol/L (ref 22–32)
Calcium: 8.5 mg/dL — ABNORMAL LOW (ref 8.9–10.3)
Calcium: 8.2 mg/dL — ABNORMAL LOW (ref 8.9–10.3)
Chloride: 101 mmol/L (ref 98–111)
Chloride: 104 mmol/L (ref 98–111)
Creatinine, Ser: 1.73 mg/dL — ABNORMAL HIGH (ref 0.61–1.24)
Creatinine, Ser: 1.7 mg/dL — ABNORMAL HIGH (ref 0.61–1.24)
GFR, Estimated: 44 mL/min — ABNORMAL LOW (ref >60)
GFR, Estimated: 45 mL/min — ABNORMAL LOW (ref >60)
Glucose, Bld: 103 mg/dL — ABNORMAL HIGH (ref 70–99)
Glucose, Bld: 124 mg/dL — ABNORMAL HIGH (ref 70–99)
Phosphorus: 2.9 mg/dL (ref 2.5–4.6)
Phosphorus: 3 mg/dL (ref 2.5–4.6)
Potassium: 4.1 mmol/L (ref 3.5–5.1)
Potassium: 3.9 mmol/L (ref 3.5–5.1)
Sodium: 135 mmol/L (ref 135–145)
Sodium: 136 mmol/L (ref 135–145)

## 2021-10-17 LAB — PREPARE RBC (CROSSMATCH)

## 2021-10-17 LAB — LACTATE DEHYDROGENASE: LDH: 281 U/L — ABNORMAL HIGH (ref 98–192)

## 2021-10-17 LAB — POCT I-STAT 7, (LYTES, BLD GAS, ICA,H+H)
Acid-Base Excess: 0 mmol/L (ref 0.0–2.0)
Bicarbonate: 25 mmol/L (ref 20.0–28.0)
Calcium, Ion: 1.16 mmol/L (ref 1.15–1.40)
HCT: 22 % — ABNORMAL LOW (ref 39.0–52.0)
Hemoglobin: 7.5 g/dL — ABNORMAL LOW (ref 13.0–17.0)
O2 Saturation: 100 %
Potassium: 4 mmol/L (ref 3.5–5.1)
Sodium: 135 mmol/L (ref 135–145)
TCO2: 26 mmol/L (ref 22–32)
pCO2 arterial: 42.9 mmHg (ref 32–48)
pH, Arterial: 7.375 (ref 7.35–7.45)
pO2, Arterial: 204 mmHg — ABNORMAL HIGH (ref 83–108)

## 2021-10-17 LAB — GLUCOSE, CAPILLARY
Glucose-Capillary: 109 mg/dL — ABNORMAL HIGH (ref 70–99)
Glucose-Capillary: 119 mg/dL — ABNORMAL HIGH (ref 70–99)
Glucose-Capillary: 125 mg/dL — ABNORMAL HIGH (ref 70–99)
Glucose-Capillary: 168 mg/dL — ABNORMAL HIGH (ref 70–99)
Glucose-Capillary: 96 mg/dL (ref 70–99)

## 2021-10-17 LAB — PROTIME-INR
INR: 1.3 — ABNORMAL HIGH (ref 0.8–1.2)
Prothrombin Time: 16.4 seconds — ABNORMAL HIGH (ref 11.4–15.2)

## 2021-10-17 LAB — ECHO INTRAOPERATIVE TEE
Height: 67 in
Weight: 3125.24 oz

## 2021-10-17 LAB — CBC
HCT: 24.7 % — ABNORMAL LOW (ref 39.0–52.0)
Hemoglobin: 7.8 g/dL — ABNORMAL LOW (ref 13.0–17.0)
MCH: 29.2 pg (ref 26.0–34.0)
MCHC: 31.6 g/dL (ref 30.0–36.0)
MCV: 92.5 fL (ref 80.0–100.0)
Platelets: 587 10*3/uL — ABNORMAL HIGH (ref 150–400)
RBC: 2.67 MIL/uL — ABNORMAL LOW (ref 4.22–5.81)
RDW: 16.1 % — ABNORMAL HIGH (ref 11.5–15.5)
WBC: 16.1 10*3/uL — ABNORMAL HIGH (ref 4.0–10.5)
nRBC: 1.9 % — ABNORMAL HIGH (ref 0.0–0.2)

## 2021-10-17 LAB — POCT I-STAT, CHEM 8
BUN: 21 mg/dL (ref 8–23)
Calcium, Ion: 1.16 mmol/L (ref 1.15–1.40)
Chloride: 101 mmol/L (ref 98–111)
Creatinine, Ser: 1.9 mg/dL — ABNORMAL HIGH (ref 0.61–1.24)
Glucose, Bld: 110 mg/dL — ABNORMAL HIGH (ref 70–99)
HCT: 26 % — ABNORMAL LOW (ref 39.0–52.0)
Hemoglobin: 8.8 g/dL — ABNORMAL LOW (ref 13.0–17.0)
Potassium: 4 mmol/L (ref 3.5–5.1)
Sodium: 136 mmol/L (ref 135–145)
TCO2: 23 mmol/L (ref 22–32)

## 2021-10-17 LAB — COOXEMETRY PANEL
Carboxyhemoglobin: 1.5 % (ref 0.5–1.5)
Methemoglobin: <0.7 % (ref 0.0–1.5)
O2 Saturation: 87 %
Total hemoglobin: 8 g/dL — ABNORMAL LOW (ref 12.0–16.0)

## 2021-10-17 LAB — MAGNESIUM: Magnesium: 2.5 mg/dL — ABNORMAL HIGH (ref 1.7–2.4)

## 2021-10-17 SURGERY — REMOVAL, CARDIAC ASSIST DEVICE, IMPELLA
Anesthesia: General | Site: Neck | Laterality: Right

## 2021-10-17 MED ORDER — SODIUM CHLORIDE 0.9 % IV SOLN
20.0000 ug | INTRAVENOUS | Status: AC
  Administered 2021-10-17: 20 ug via INTRAVENOUS
  Filled 2021-10-17: qty 5

## 2021-10-17 MED ORDER — HYDRALAZINE HCL 20 MG/ML IJ SOLN
10.0000 mg | INTRAMUSCULAR | Status: DC | PRN
  Administered 2021-10-17 – 2021-10-19 (×3): 10 mg via INTRAVENOUS
  Filled 2021-10-17 (×3): qty 1

## 2021-10-17 MED ORDER — LACTATED RINGERS IV SOLN: INTRAVENOUS | Status: DC | PRN

## 2021-10-17 MED ORDER — FENTANYL CITRATE PF 50 MCG/ML IJ SOSY
50.0000 ug | PREFILLED_SYRINGE | Freq: Once | INTRAMUSCULAR | Status: AC
  Administered 2021-10-17: 50 ug via INTRAVENOUS
  Filled 2021-10-17: qty 1

## 2021-10-17 MED ORDER — ROCURONIUM BROMIDE 10 MG/ML (PF) SYRINGE
PREFILLED_SYRINGE | INTRAVENOUS | Status: DC | PRN
  Administered 2021-10-17: 40 mg via INTRAVENOUS
  Administered 2021-10-17: 30 mg via INTRAVENOUS

## 2021-10-17 MED ORDER — LIDOCAINE-EPINEPHRINE (PF) 1 %-1:200000 IJ SOLN
INTRAMUSCULAR | Status: DC | PRN
  Administered 2021-10-17: 30 mL

## 2021-10-17 MED ORDER — LIDOCAINE-EPINEPHRINE (PF) 1 %-1:200000 IJ SOLN
INTRAMUSCULAR | Status: AC
  Filled 2021-10-17: qty 30

## 2021-10-17 MED ORDER — MIDAZOLAM HCL 2 MG/2ML IJ SOLN
INTRAMUSCULAR | Status: DC | PRN
  Administered 2021-10-17 (×2): 1 mg via INTRAVENOUS

## 2021-10-17 MED ORDER — 0.9 % SODIUM CHLORIDE (POUR BTL) OPTIME
TOPICAL | Status: DC | PRN
  Administered 2021-10-17: 1000 mL

## 2021-10-17 MED ORDER — SODIUM CHLORIDE 0.9 % IV SOLN: 10.0000 mL/h | Freq: Once | INTRAVENOUS | Status: DC

## 2021-10-17 MED ORDER — MIDAZOLAM HCL 2 MG/2ML IJ SOLN
INTRAMUSCULAR | Status: AC
Start: 2021-10-17 — End: ?
  Filled 2021-10-17: qty 2

## 2021-10-17 SURGICAL SUPPLY — 68 items
APL SRG 7X2 LUM MLBL SLNT (VASCULAR PRODUCTS)
APPLICATOR TIP COSEAL (VASCULAR PRODUCTS) IMPLANT
BLADE CLIPPER SURG (BLADE) ×3 IMPLANT
BLADE SURG 11 STRL SS (BLADE) IMPLANT
CANISTER WOUNDNEG PRESSURE 500 (CANNISTER) IMPLANT
CATH DIAG EXPO 6F AL1 (CATHETERS) ×3 IMPLANT
CATH INFINITI 6F MPB2 (CATHETERS) ×3 IMPLANT
CLIP TI MEDIUM 24 (CLIP) IMPLANT
CLIP TI WIDE RED SMALL 24 (CLIP) IMPLANT
CONTAINER PROTECT SURGISLUSH (MISCELLANEOUS) ×3 IMPLANT
DRAPE C-ARM 42X72 X-RAY (DRAPES) ×6 IMPLANT
DRAPE CV SPLIT W-CLR ANES SCRN (DRAPES) ×3 IMPLANT
DRAPE PERI GROIN 82X75IN TIB (DRAPES) ×3 IMPLANT
DRAPE WARM FLUID 44X44 (DRAPES) ×3 IMPLANT
DRESSING PEEL AND PLAC PRVNA20 (GAUZE/BANDAGES/DRESSINGS) IMPLANT
DRSG COVADERM 4X6 (GAUZE/BANDAGES/DRESSINGS) IMPLANT
DRSG PEEL AND PLACE PREVENA 20 (GAUZE/BANDAGES/DRESSINGS) ×3
ELECT REM PT RETURN 9FT ADLT (ELECTROSURGICAL) ×6
ELECTRODE REM PT RTRN 9FT ADLT (ELECTROSURGICAL) IMPLANT
FELT TEFLON 1X6 (MISCELLANEOUS) IMPLANT
GAUZE 4X4 16PLY ~~LOC~~+RFID DBL (SPONGE) ×3 IMPLANT
GAUZE SPONGE 4X4 12PLY STRL (GAUZE/BANDAGES/DRESSINGS) IMPLANT
GLOVE BIO SURGEON STRL SZ 6.5 (GLOVE) IMPLANT
GLOVE BIO SURGEON STRL SZ7.5 (GLOVE) IMPLANT
GLOVE BIOGEL M STRL SZ7.5 (GLOVE) ×9 IMPLANT
GLOVE BIOGEL PI IND STRL 6 (GLOVE) IMPLANT
GLOVE ECLIPSE 7.0 STRL STRAW (GLOVE) IMPLANT
GOWN STRL REUS W/ TWL LRG LVL3 (GOWN DISPOSABLE) ×12 IMPLANT
GOWN STRL REUS W/ TWL XL LVL3 (GOWN DISPOSABLE) IMPLANT
GOWN STRL REUS W/TWL LRG LVL3 (GOWN DISPOSABLE) ×6
GOWN STRL REUS W/TWL XL LVL3 (GOWN DISPOSABLE) ×9
INSERT FOGARTY SM (MISCELLANEOUS) ×3 IMPLANT
KIT BASIN OR (CUSTOM PROCEDURE TRAY) ×3 IMPLANT
LOOP VESSEL MAXI BLUE (MISCELLANEOUS) ×3 IMPLANT
LOOP VESSEL MINI RED (MISCELLANEOUS) ×3 IMPLANT
NDL 18GX1X1/2 (RX/OR ONLY) (NEEDLE) IMPLANT
NEEDLE 18GX1X1/2 (RX/OR ONLY) (NEEDLE) ×3 IMPLANT
NS IRRIG 1000ML POUR BTL (IV SOLUTION) ×12 IMPLANT
PACK CHEST (CUSTOM PROCEDURE TRAY) ×3 IMPLANT
PAD ARMBOARD 7.5X6 YLW CONV (MISCELLANEOUS) ×6 IMPLANT
PAD ELECT DEFIB RADIOL ZOLL (MISCELLANEOUS) ×3 IMPLANT
PUMP SET IMPELLA 5.5 US (CATHETERS) ×3 IMPLANT
SEALANT SURG COSEAL 8ML (VASCULAR PRODUCTS) ×3 IMPLANT
SPONGE T-LAP 18X18 ~~LOC~~+RFID (SPONGE) ×12 IMPLANT
SPONGE T-LAP 4X18 ~~LOC~~+RFID (SPONGE) ×3 IMPLANT
SUT ETHILON 3 0 PS 1 (SUTURE) ×6 IMPLANT
SUT PROLENE 3 0 SH DA (SUTURE) IMPLANT
SUT PROLENE 4 0 RB 1 (SUTURE) ×3
SUT PROLENE 4-0 RB1 .5 CRCL 36 (SUTURE) IMPLANT
SUT PROLENE 5 0 C1 (SUTURE) ×6 IMPLANT
SUT SILK  1 MH (SUTURE)
SUT SILK 1 MH (SUTURE) ×12 IMPLANT
SUT SILK 1 TIES 10X30 (SUTURE) ×3 IMPLANT
SUT SILK 3 0 TIES 17X18 (SUTURE)
SUT SILK 3-0 18XBRD TIE BLK (SUTURE) ×3 IMPLANT
SUT VIC AB 2-0 CT1 18 (SUTURE) ×6 IMPLANT
SUT VIC AB 3-0 SH 27 (SUTURE) ×6
SUT VIC AB 3-0 SH 27X BRD (SUTURE) IMPLANT
SUT VIC AB 3-0 SH 27XBRD (SUTURE) IMPLANT
SUT VIC AB 3-0 X1 27 (SUTURE) ×3 IMPLANT
SYR 20ML LL LF (SYRINGE) IMPLANT
TOWEL GREEN STERILE (TOWEL DISPOSABLE) ×3 IMPLANT
TOWEL GREEN STERILE FF (TOWEL DISPOSABLE) ×3 IMPLANT
TRAY FOLEY SLVR 16FR TEMP STAT (SET/KITS/TRAYS/PACK) ×3 IMPLANT
TRAY TRACH DILATOR BL RHINO G2 (MISCELLANEOUS) IMPLANT
TUBE TRACH FLEX 8.0 CUFF (MISCELLANEOUS) ×3
TUBE TRACH FLEX 8.5 CUFF (MISCELLANEOUS) IMPLANT
WATER STERILE IRR 1000ML POUR (IV SOLUTION) ×6 IMPLANT

## 2021-10-17 NOTE — Progress Notes (Signed)
SLP Cancellation Note  Patient Details Name: [name] MRN: [id number] DOB: Aug 07, 1960   Cancelled treatment:       Reason Eval/Treat Not Completed: Patient not medically ready Patient with new tracheostomy 10/17/21 and remains on the vent at this time. Orders for SLP eval and treat for PMSV and swallowing received. Will follow pt closely for readiness for SLP interventions as appropriate.   Seena Face I. Hardin Negus, [place], [place] Office number [phone]   [name] 10/17/2021, 12:42 PM

## 2021-10-17 NOTE — Procedures (Signed)
Diagnostic Bronchoscopy  [name]  [id number]  Sep 20, 1960  Date:10/17/21  Time:9:40 AM   Provider Performing:Almin Livingstone P [name]   Procedure: Diagnostic Bronchoscopy (59102)  Indication(s) Assist with direct visualization of tracheostomy placement  Consent Risks of the procedure as well as the alternatives and risks of each were explained to the patient and/or caregiver.  Consent for the procedure was obtained.   Anesthesia See separate anesthesia record.   Time Out Verified patient identification, verified procedure, site/side was marked, verified correct patient position, special equipment/implants available, medications/allergies/relevant history reviewed, required imaging and test results available.    Sterile Technique Usual hand hygiene, masks, and gloves were used.    Procedure Description Bronchoscope advanced through endotracheal tube and into airway. Due to lack of volumes on vent with bronchoscope in place, ETT was upsized by Anesthesia. Appropriate Vt confirmed on MV with ETCO2.  After suctioning out tracheal secretions, bronchoscope used to provide direct visualization of tracheostomy placement. Successful placement of Shiley # 8 cuffed trach confirmed with bronchoscope. 5cc 1% lidocaine with epi in the upper trachea above the stoma due to mild bloody secretions. Upper airway secretions suctioned.    Complications/Tolerance None; patient tolerated the procedure well.   EBL <5cc  Specimen(s) None  [name], DO 10/17/21 9:49 AM [facility]

## 2021-10-17 NOTE — Progress Notes (Signed)
[facility] Progress Note  Name: [name] MRN: [id number] DOB: 11-11-1960   Subjective:  Seen and examined on CRRT.  He had 2.9 liters UF over 9/6 with CRRT.  Note the UF order was changed by CTS.  He has been anuric.  He has been on epi 2 mcg/min.  He has been keep even to net negative 50 ml/hr as they are trying not to dry him out as he has impella removal for this morning.     Review of systems:   Unable to obtain 2/2 intubated and sedated    Intake/Output Summary (Last 24 hours) at 10/17/2021 0653 Last data filed at 10/17/2021 0600 Gross per 24 hour  Intake 3311.28 ml  Output 4021 ml  Net -709.72 ml    Vitals:  Vitals:   10/17/21 0300 10/17/21 0400 10/17/21 0500 10/17/21 0600  BP:      Pulse: 71 71 74 78  Resp: 16 16 18 18   Temp: (!) 96.6 F (35.9 C) (!) 97.2 F (36.2 C) (!) 97.5 F (36.4 C) 97.9 F (36.6 C)  TempSrc:  Esophageal    SpO2: 100% 100% 100% 100%  Weight:   88.6 kg   Height:         Physical Exam:      General adult male in bed critically ill  HEENT normocephalic atraumatic  Neck supple trachea midline Lungs coarse mechanical breath sounds Heart S1S2; on pressors  Abdomen soft nontender nondistended Extremities 2+ edema diffusely Neuro - sedation currently running  Access: left femoral nontunneled catheter in place   Medications reviewed   Labs:     Latest Ref Rng & Units 10/17/2021    4:00 AM 10/16/2021    4:00 PM 10/16/2021    4:07 AM  BMP  Glucose 70 - 99 mg/dL 103  125  90   BUN 8 - 23 mg/dL 20  24  26    Creatinine 0.61 - 1.24 mg/dL 1.73  1.76  1.53   Sodium 135 - 145 mmol/L 135  136  136   Potassium 3.5 - 5.1 mmol/L 4.1  4.4  4.8   Chloride 98 - 111 mmol/L 101  103  102   CO2 22 - 32 mmol/L 24  23  24    Calcium 8.9 - 10.3 mg/dL 8.5  8.5  8.9      Assessment/Plan:   Pt is a 61 y.o. yo male  with history of HTN, HLD, DM, A-fib, stroke, CKD, CAD multivessel dz status post CABG on 8/3 presented with shortness of breath,  found to have cardiac tamponade, course complicated by cardiac arrest, use of ECMO, seen as a consultation for the evaluation of AKI and fluid volume management.   #Acute kidney injury on CKD IIIa, nonoliguric: Ischemic ATN due to cardiogenic shock/cardiac arrest and multiple hemodynamic changes.  Started CRRT on 8/28 for decreasing urine output and fluid/volume management - Continue CRRT.  UF goal keep even to net negative 50 ml/hr in anticipation of a procedure later today - on all 4 K fluids  - not on anticoagulation with CRRT and note concern for bleeding as above   #Cardiac tamponade/hemorrhagic pericarditis status post pericardial drain placement with subsequent ECMO placement.  decannulated  with placement of Impella. On colchicine per cardiology S/p closure of the sternum and mediastinal washout on 9/2.   #Cardiogenic shock/cardiac arrest: Was on ECMO before. Continue pressors per primary team    #Acute hypoxic respiratory failure: Currently intubated and sedated.  Vent  per pulmonary.  Optimize volume status with CRRT    #Acute blood loss anemia: Monitor hemoglobin and transfuse as needed by ICU team.  Aranesp 40 mcg weekly on Mondays started on 9/4     #Hypophosphatemia due to CRRT, replete sodium phosphate as needed  Disposition - continue ICU monitoring  Claudia Desanctis, MD 10/17/2021 7:12 AM

## 2021-10-17 NOTE — Progress Notes (Signed)
  Echocardiogram Echocardiogram Transesophageal has been performed.  [name] 10/17/2021, 8:47 AM

## 2021-10-17 NOTE — Plan of Care (Signed)
Problem: Education: Goal: Will demonstrate proper wound care and an understanding of methods to prevent future damage Outcome: Progressing Goal: Knowledge of disease or condition will improve Outcome: Progressing Goal: Knowledge of the prescribed therapeutic regimen will improve Outcome: Progressing Goal: Individualized Educational Video(s) Outcome: Progressing   Problem: Activity: Goal: Risk for activity intolerance will decrease Outcome: Progressing   Problem: Cardiac: Goal: Will achieve and/or maintain hemodynamic stability Outcome: Progressing   Problem: Clinical Measurements: Goal: Postoperative complications will be avoided or minimized Outcome: Progressing   Problem: Respiratory: Goal: Respiratory status will improve Outcome: Progressing   Problem: Skin Integrity: Goal: Wound healing without signs and symptoms of infection Outcome: Progressing Goal: Risk for impaired skin integrity will decrease Outcome: Progressing   Problem: Urinary Elimination: Goal: Ability to achieve and maintain adequate renal perfusion and functioning will improve Outcome: Progressing   Problem: Education: Goal: Understanding of cardiac disease, CV risk reduction, and recovery process will improve Outcome: Progressing Goal: Individualized Educational Video(s) Outcome: Progressing   Problem: Activity: Goal: Ability to tolerate increased activity will improve Outcome: Progressing   Problem: Cardiac: Goal: Ability to achieve and maintain adequate cardiovascular perfusion will improve Outcome: Progressing   Problem: Health Behavior/Discharge Planning: Goal: Ability to safely manage health-related needs after discharge will improve Outcome: Progressing   Problem: Education: Goal: Knowledge of General Education information will improve Description: Including pain rating scale, medication(s)/side effects and non-pharmacologic comfort measures Outcome: Progressing   Problem: Health  Behavior/Discharge Planning: Goal: Ability to manage health-related needs will improve Outcome: Progressing   Problem: Clinical Measurements: Goal: Ability to maintain clinical measurements within normal limits will improve Outcome: Progressing Goal: Will remain free from infection Outcome: Progressing Goal: Diagnostic test results will improve Outcome: Progressing Goal: Respiratory complications will improve Outcome: Progressing Goal: Cardiovascular complication will be avoided Outcome: Progressing   Problem: Activity: Goal: Risk for activity intolerance will decrease Outcome: Progressing   Problem: Nutrition: Goal: Adequate nutrition will be maintained Outcome: Progressing   Problem: Coping: Goal: Level of anxiety will decrease Outcome: Progressing   Problem: Elimination: Goal: Will not experience complications related to bowel motility Outcome: Progressing Goal: Will not experience complications related to urinary retention Outcome: Progressing   Problem: Pain Managment: Goal: General experience of comfort will improve Outcome: Progressing   Problem: Safety: Goal: Ability to remain free from injury will improve Outcome: Progressing   Problem: Skin Integrity: Goal: Risk for impaired skin integrity will decrease Outcome: Progressing   Problem: Education: Goal: Ability to describe self-care measures that may prevent or decrease complications (Diabetes Survival Skills Education) will improve Outcome: Progressing Goal: Individualized Educational Video(s) Outcome: Progressing   Problem: Coping: Goal: Ability to adjust to condition or change in health will improve Outcome: Progressing   Problem: Fluid Volume: Goal: Ability to maintain a balanced intake and output will improve Outcome: Progressing   Problem: Health Behavior/Discharge Planning: Goal: Ability to identify and utilize available resources and services will improve Outcome: Progressing Goal:  Ability to manage health-related needs will improve Outcome: Progressing   Problem: Metabolic: Goal: Ability to maintain appropriate glucose levels will improve Outcome: Progressing   Problem: Nutritional: Goal: Maintenance of adequate nutrition will improve Outcome: Progressing Goal: Progress toward achieving an optimal weight will improve Outcome: Progressing   Problem: Skin Integrity: Goal: Risk for impaired skin integrity will decrease Outcome: Progressing   Problem: Tissue Perfusion: Goal: Adequacy of tissue perfusion will improve Outcome: Progressing   Problem: Education: Goal: Knowledge about tracheostomy care/management will improve Outcome: Progressing   Problem: Activity: Goal:  Ability to tolerate increased activity will improve Outcome: Progressing

## 2021-10-17 NOTE — Progress Notes (Signed)
NAME:  [name], MRN:  [id number], DOB:  Mar 16, 1960, LOS: 65 ADMISSION DATE:  09/28/2021, CONSULTATION DATE:  8/20 REFERRING MD:  Kipp Brood, CHIEF COMPLAINT:  dyspnea   History of Present Illness:  61 y/o male underwent CABG on 8/3, readmitted on 8/19 in setting of dyspnea with a pericardial effusion requiring pericardial window via bedside sternotomy.  Has had pleural effusions, developed PEA arrest on 8/20, briefly required VA ECMO which was discontinued on 8/29, impella placed, started on CRRT.  Hypoxemia, repeat cardiac arrest on 8/30.    Pertinent  Medical History  CAD s/p CABG 8/3 DM2 Diverticulosis GERD Hyperlipidemia Hypertension History of stroke Tobacco Abuse  Significant Hospital Events: Including procedures, antibiotic start and stop dates in addition to other pertinent events   8/19 admitted, s/p pericardial window 8/20 PEA cardiac arrest, left pigtail chest tube placement, PEA cardiac arrest due to tamponade, bedside sternotomy with pericardial drains placed. 8/21 return to OR for overnight bleeding.  8/22 minimal chest tube output.  Atrial fibrillation, controled with amiodarone.  8/22 TEE showed EF 35% at baseline, which improved significantly with decreasing ECMO flow.  8/23 tolerated diuresis  8/24 mediastinal washout, removal of hematoma 8/25 ongoing bleeding issues from posterior mediastinum 8/29 decannulated and off ECMO, 5.5 Impella placed, L femoral HD cath placed and started on CRRT 8/30 hypoxia and hypotension followed by brief arrest, gentle L lateral chest compressions performed and ROSC in roughly 1 minute. 8/31 remains deeply sedated on ventilator with open chest.  No acute events overnight 9/1 no acute events overnight, remains on ventilator deep sedation.  Tolerating aggressive volume removal per CRRT 9/2 sternotomy closure 9/3 wean versed 9/4 FOB, low grade temps, notable thick mucus/frequent suctioning 9/5 Vomiting, ?mucus plugging. PSV 5-6  hours on 10/5 9/6 impella down to p3 9/7 plan for impella removal and trach  Interim History / Subjective:   EN off at midnight  Hgb down to 7.8 today   Objective   Blood pressure 96/60, pulse 74, temperature (!) 97.3 F (36.3 C), resp. rate 16, height 5\' 7"  (1.702 m), weight 88.6 kg, SpO2 100 %. CVP:  [9 mmHg-14 mmHg] 11 mmHg  Vent Mode: PRVC FiO2 (%):  [40 %] 40 % Set Rate:  [16 bmp] 16 bmp Vt Set:  [530 mL] 530 mL PEEP:  [8 cmH20] 8 cmH20 Plateau Pressure:  [18 cmH20-22 cmH20] 22 cmH20   Intake/Output Summary (Last 24 hours) at 10/17/2021 1144 Last data filed at 10/17/2021 1100 Gross per 24 hour  Intake 4030.52 ml  Output 2960 ml  Net 1070.52 ml   Filed Weights   10/15/21 0433 10/16/21 0436 10/17/21 0500  Weight: 93.2 kg 87.3 kg 88.6 kg    Examination: General: Critically ill adult F intubated lightly sedated  HEENT: NCAT pink mm ETT sercure  Neuro: nodding shaking heads to questions CV:  midline sternal wound vac in place.  PULM: Mechanically ventilated. Symmetrical chest expansion  GI: soft nontender  Extremities: no acute joint deformity  Skin: c/d/w  Resolved Hospital Problem list   Acute RV failure > resolved  Assessment & Plan:   Acute metabolic encephalopathy P -sedation wean efforts after trach as able  -delirium precautions   Hemorrhagic pericarditis, with tamponade PEA arrest due to tamponade, s/p emergent sternotomy Cardiogenic/hemorrhagic shock s/p VA ECMO, now Impella  CAD s/p CABG x 3 pAfib  P -plan for impella out 9/7 -cont amio, statin  -lyte optimization  -vanc, mero  -volume goal net even  -Epi gtt  Acute hypoxemic and hypercarbic respiratory failure P -plan for trach 9/7 -- ?OR if can coordinate with impella removal vs bedside  -post trach resume vent wean efforts as able  AKI with anuria  P -CRRT per nephro -goal net even   ABLA Thrombocytosis  -hgb down to 7.8 from 8.8 (9/7) P -plan for PRBC and ddavp in OR -post OR  cbc   Vomiting Diarrhea  -mild bloody outpt from OGT, likely sxn trauma. Imaging not reflective on ileus  -Cdiff neg  P -BID PPI  -NGT to sxn -EN via post pyloric tube -- on hold 9/7 for OR   DM II P -SSI  At Risk Malnutrition  -EN on hold 9/7. Resume post OR   Best Practice (right click and "Reselect all SmartList Selections" daily)  Diet/type: tubefeeds -- on hold  DVT prophylaxis: prophylactic heparin  GI prophylaxis: PPI Lines: Central line, temp HD cath.  Foley:  Yes, and it is still needed Code Status:  full code Last date of multidisciplinary goals of care discussion [per primary]  Wife updated via phone 9/6 on plan of care. Reviewed risks/benefits of trach, progression of trach needs etc.    CRITICAL CARE Performed by: Cristal Generous  Total critical care time: 38 minutes  Critical care time was exclusive of separately billable procedures and treating other patients. Critical care was necessary to treat or prevent imminent or life-threatening deterioration.  Critical care was time spent personally by me on the following activities: development of treatment plan with patient and/or surrogate as well as nursing, discussions with consultants, evaluation of patient's response to treatment, examination of patient, obtaining history from patient or surrogate, ordering and performing treatments and interventions, ordering and review of laboratory studies, ordering and review of radiographic studies, pulse oximetry and re-evaluation of patient's condition.  Eliseo Gum MSN, AGACNP-BC [place] for pager  10/17/2021, 11:44 AM

## 2021-10-17 NOTE — Transfer of Care (Addendum)
Immediate Anesthesia Transfer of Care Note  Patient: [name]  Procedure(s) Performed: REMOVAL OF IMPELLA LEFT VENTRICULAR ASSIST DEVICE (Right: Axilla) TRANSESOPHAGEAL ECHOCARDIOGRAM (TEE) PERCUTANEOUS TRACHEOSTOMY USING SHILEY FLEXIBLE 8 mm CUFFED TRACH. (Neck)  Patient Location: SICU  Anesthesia Type:General  Level of Consciousness: sedated  Airway & Oxygen Therapy: Patient remains intubated per anesthesia plan and Patient placed on Ventilator (see vital sign flow sheet for setting)  Post-op Assessment: Report given to RN and Post -op Vital signs reviewed and stable  Post vital signs: Reviewed and stable  Last Vitals:  Vitals Value Taken Time  BP 170/54   Temp    Pulse 77   Resp 16   SpO2 99     Last Pain:  Vitals:   10/17/21 0400  TempSrc: Esophageal  PainSc:          Complications: No notable events documented.

## 2021-10-17 NOTE — Anesthesia Procedure Notes (Signed)
Date/Time: 10/17/2021 9:24 AM  Performed by: Santa Lighter, MDPreoxygenation: Pre-oxygenation with 100% oxygen Induction Type: Inhalational induction with existing ETT Laryngoscope Size: Glidescope and 4 Grade View: Grade I Airway Equipment and Method: Video-laryngoscopy (exchange over cook catheter using glidescope for visualization) Placement Confirmation: positive ETCO2 and breath sounds checked- equal and bilateral Secured at: 23 cm Tube secured with: Tape Dental Injury: Teeth and Oropharynx as per pre-operative assessment

## 2021-10-17 NOTE — Brief Op Note (Signed)
10/17/2021  10:00 AM  PATIENT:  [name]  61 y.o. male  PRE-OPERATIVE DIAGNOSIS:  CONGESTIVE HEART FAILURE  POST-OPERATIVE DIAGNOSIS:  CONGESTIVE HEART FAILURE  PROCEDURE:  Procedure(s) with comments: REMOVAL OF IMPELLA LEFT VENTRICULAR ASSIST DEVICE (Right) TRANSESOPHAGEAL ECHOCARDIOGRAM (TEE) (N/A) PERCUTANEOUS TRACHEOSTOMY USING SHILEY FLEXIBLE 8 mm CUFFED TRACH. (N/A) - Percutaneous tracheostomy  SURGEON:  Surgeon(s) and Role: Panel 1:    * Molly Savarino, Fernande Boyden, MD - Primary Removal of Impella Panel 2:    * Candee Furbish, MD - Primary Tracheostomy  PHYSICIAN ASSISTANT: none  ASSISTANTS: RNFA   ANESTHESIA:   general  EBL:  minimal  BLOOD ADMINISTERED: 1 unit  PRBC  DRAINS: none   LOCAL MEDICATIONS USED:  NONE  SPECIMEN:  No Specimen  DISPOSITION OF SPECIMEN:  N/A  COUNTS:  YES  TOURNIQUET:  * No tourniquets in log *  DICTATION: .Note written in EPIC  PLAN OF CARE: Admit to inpatient   PATIENT DISPOSITION:  ICU - intubated and hemodynamically stable.   Delay start of Pharmacological VTE agent (>24hrs) due to surgical blood loss or risk of bleeding: yes

## 2021-10-17 NOTE — Interval H&P Note (Signed)
History and Physical Interval Note:  10/17/2021 7:28 AM  [name]  has presented today for surgery, with the diagnosis of CHF.  The various methods of treatment have been discussed with the patient and family. After consideration of risks, benefits and other options for treatment, the patient has consented to  Procedure(s): REMOVAL OF IMPELLA LEFT VENTRICULAR ASSIST DEVICE (N/A) TRANSESOPHAGEAL ECHOCARDIOGRAM (TEE) (N/A) as a surgical intervention.  The patient's history has been reviewed, patient examined, no change in status, stable for surgery.  I have reviewed the patient's chart and labs.  Questions were answered to the patient's satisfaction.     [name]

## 2021-10-17 NOTE — Op Note (Addendum)
Percutaneous Tracheostomy Procedure Note   BEE HAMMERSCHMIDT  [id number]  January 17, 1961  Date:10/17/21  Time:10:41 AM   Provider Performing:Milagro Belmares C Tamala Julian  Procedure: Percutaneous Tracheostomy with Bronchoscopic Guidance (60600)  Indication(s) Persistent resp failure  Consent Risks of the procedure as well as the alternatives and risks of each were explained to the patient and/or caregiver.  Consent for the procedure was obtained.  Anesthesia General in place for impella extraction   Time Out Verified patient identification, verified procedure, site/side was marked, verified correct patient position, special equipment/implants available, medications/allergies/relevant history reviewed, required imaging and test results available.   Sterile Technique Maximal sterile technique including sterile barrier drape, hand hygiene, sterile gown, sterile gloves, mask, hair covering.    Procedure Description Appropriate anatomy identified by palpation.  Patient's neck prepped and draped in sterile fashion.  1% lidocaine with epinephrine was used to anesthetize skin overlying neck.  1.5cm incision made and blunt dissection performed until tracheal rings could be easily palpated.   Then a size 8-0 Shiley tracheostomy was placed under bronchoscopic visualization using usual Seldinger technique and serial dilation.   Bronchoscope confirmed placement above the carina.  Tracheostomy was sutured in place with adhesive pad to protect skin under pressure.    Patient connected to ventilator.   Complications/Tolerance None; patient tolerated the procedure well. Chest X-ray is ordered to confirm no post-procedural complication.   EBL Minimal   Specimen(s) None

## 2021-10-17 NOTE — Op Note (Signed)
CARDIOVASCULAR SURGERY OPERATIVE NOTE  10/17/2021  Surgeon:  [name], MD  First Assistant: RNFA  Preoperative Diagnosis:  Right Axillary Artery Impella in place.   Postoperative Diagnosis:  Same   Procedure:  Removal of right axillary artery Impella.  Anesthesia:  General Endotracheal   Clinical History/Surgical Indication:  The patient is a 61 year old gentleman who underwent coronary artery bypass graft surgery x5 on 09/12/2021.  His postoperative course was complicated by atrial fibrillation that was converted with amiodarone.  He was discharged home on Eliquis.  He also has stage IIIb chronic kidney disease with a creatinine of around 1.8 which increased slightly postoperatively and prolonged how long it took to diurese him back to his baseline weight.  He continued to improve and was discharged home on 09/20/2021.  He reported that he initially felt well when he went home but then began having lethargy and dyspnea on exertion and he presented to the emergency room on 09/28/2021 after he woke up with acute shortness of breath and diaphoresis.  A CT scan of the chest showed cardiomegaly with small pericardial effusion.  There were small bilateral pleural effusions with bibasilar atelectasis.  A 2D echocardiogram on 09/28/2021 showed a left ventricular ejection fraction of 50% with grade 2 diastolic dysfunction.  There was felt to be a moderate size pericardial effusion that was circumferential.  There was a question of possible tamponade.  The patient was taken to the operating room by Dr. Kipp Brood on 09/28/2021 and underwent right VATS for pericardial window.  It was noted that there was significant clot upon entry into the pericardium as well as some serosanguineous fluid.  He was taken back to the intensive care unit but remained hemodynamically unstable and became unresponsive and lost his  blood pressure requiring CPR.  After he regained his pulse a bedside echo showed a large posterior clot and he underwent mediastinal exploration by Dr. Kipp Brood.  The clot was evacuated with improvement in his hemodynamics.  The sternum was not closed.  He continued to have arrhythmias and cardiogenic shock and was taken back to the operating room for mediastinal reexploration and institution of VA ECMO by Dr. Kipp Brood.  This was performed using central aortic cannulation and a 23/25 French multistage cannula in the right femoral vein.  He has since required two mediastinal explorations for bleeding and washout requiring large amounts of clotting factors. No specific bleeding sites were found.  He was on ECMO for 8 days and was taken back to the OR for insertion of an Impella 5.5 by the right axillary artery to provide hemodynamic support and discontinuation of ECMO.  He has done well since that procedure with weaning of inotropes and vasopressors and removal of a large amount of fluid with CRRT. He is very stable on Epi 3 mcg with the Impella at [facility]. He was taken back to the OR last Saturday for sternal closure which he tolerated well and has continued to remain stable with volume removal. It is felt that he is ready for Impella removal.  The operative procedure was discussed with the patient's wife including alternatives, benefits, and risks including but not limited to bleeding, blood transfusion, infection, stroke, myocardial infarction, organ dysfunction, and death.  She understood and agreed to proceed.  Preparation:  [name]. The consent was signed by me. He has been on continuous antibiotics. The patient was positioned supine on the operating room table. After being placed under  general endotracheal anesthesia by the anesthesia team the neck, chest and abdomen were prepped with betadine soap and solution and draped in the usual sterile manner. A  surgical time-out was taken and the correct patient and operative procedure were confirmed with the nursing and anesthesia staff.  TEE was performed by Dr.Turk. This showed good LV systolic function with no wall motion abnormalities. There was mild central AI, trivial MR. The RV was not dilated and moderately hypokinetic.  Removal of Impella:  The right axillary incision was opened.  The Impella was turned down to P2 without any change in hemodynamics.  It was then turned off and the Impella removed through the graft without difficulty.  The graft was clamped.  The graft was ligated with a heavy silk tie and the excess graft removed.  The stump was suture-ligated with a 3-0 Prolene pledgeted horizontal mattress suture.  Hemostasis was complete.  The lateralis muscle was closed with continuous 2-0 vicryl suture. The subcutaneous tissue was closed with 3-0 vicryl continuous suture. The skin was closed with 3-0 vicryl subcuticular suture. All sponge, needle, and instrument counts were reported correct at the end of the case. Dry sterile dressings were placed over the incision.  The percutaneous tracheostomy was then performed by Dr. Erskine Emery and will be dictated by him.  The patient was then transported to the surgical intensive care unit in stable condition.

## 2021-10-17 NOTE — Plan of Care (Signed)
  Problem: Education: Goal: Knowledge of disease or condition will improve Outcome: Progressing   Problem: Cardiac: Goal: Will achieve and/or maintain hemodynamic stability Outcome: Progressing   Problem: Clinical Measurements: Goal: Postoperative complications will be avoided or minimized Outcome: Progressing   Problem: Clinical Measurements: Goal: Will remain free from infection Outcome: Progressing Goal: Diagnostic test results will improve Outcome: Progressing Goal: Respiratory complications will improve Outcome: Progressing   Problem: Nutrition: Goal: Adequate nutrition will be maintained Outcome: Progressing   Problem: Pain Managment: Goal: General experience of comfort will improve Outcome: Progressing

## 2021-10-17 NOTE — Progress Notes (Signed)
EVENING ROUNDS NOTE :     [place]       [place] [address]             [phone]                 Day of Surgery Procedure(s) (LRB): REMOVAL OF IMPELLA LEFT VENTRICULAR ASSIST DEVICE (Right) TRANSESOPHAGEAL ECHOCARDIOGRAM (TEE) (N/A) PERCUTANEOUS TRACHEOSTOMY USING SHILEY FLEXIBLE 8 mm CUFFED TRACH. (N/A)   Total Length of Stay:  LOS: 19 days  Events:   Some agitation HD stable    BP 96/60   Pulse 67   Temp 98 F (36.7 C) (Axillary)   Resp 17   Ht 5\' 7"  (1.702 m)   Wt 88.6 kg   SpO2 100%   BMI 30.59 kg/m   CVP:  [10 mmHg-21 mmHg] 14 mmHg  Vent Mode: PRVC FiO2 (%):  [40 %] 40 % Set Rate:  [16 bmp] 16 bmp Vt Set:  [530 mL] 530 mL PEEP:  [8 cmH20] 8 cmH20 Plateau Pressure:  [18 cmH20-26 cmH20] 26 cmH20    prismasol BGK 4/2.5 400 mL/hr at 10/17/21 1046    prismasol BGK 4/2.5 400 mL/hr at 10/17/21 1045   sodium chloride 5 mL/hr at 10/17/21 1730   sodium chloride     albumin human Stopped (10/15/21 1901)   amiodarone 60 mg/hr (10/17/21 1719)   epinephrine 2 mcg/min (10/17/21 1500)   feeding supplement (PIVOT 1.5 CAL) 20 mL/hr at 10/17/21 1500   HYDROmorphone 4 mg/hr (10/17/21 1725)   meropenem (MERREM) IV Stopped (10/17/21 0242)   prismasol BGK 4/2.5 1,800 mL/hr at 10/17/21 1618   vancomycin Stopped (10/16/21 2117)   vasopressin Stopped (10/16/21 0244)    I/O last 3 completed shifts: In: 4886.8 [I.V.:2341.5; Other:348.1; QD/IY:6415.8; IV Piggyback:1049.7] Out: 7451 [Emesis/NG output:975; Drains:30; Stool:395]      Latest Ref Rng & Units 10/17/2021    8:32 AM 10/17/2021    8:28 AM 10/17/2021    4:00 AM  CBC  WBC 4.0 - 10.5 K/uL   16.1   Hemoglobin 13.0 - 17.0 g/dL 8.8  7.5  7.8   Hematocrit 39.0 - 52.0 % 26.0  22.0  24.7   Platelets 150 - 400 K/uL   587        Latest Ref Rng & Units 10/17/2021    8:32 AM 10/17/2021    8:28 AM 10/17/2021    4:00 AM  BMP  Glucose 70 - 99 mg/dL 110   103   BUN 8 - 23 mg/dL 21   20   Creatinine 0.61 -  1.24 mg/dL 1.90   1.73   Sodium 135 - 145 mmol/L 136  135  135   Potassium 3.5 - 5.1 mmol/L 4.0  4.0  4.1   Chloride 98 - 111 mmol/L 101   101   CO2 22 - 32 mmol/L   24   Calcium 8.9 - 10.3 mg/dL   8.5     ABG    Component Value Date/Time   PHART 7.375 10/17/2021 0828   PCO2ART 42.9 10/17/2021 0828   PO2ART 204 (H) 10/17/2021 0828   HCO3 25.0 10/17/2021 0828   TCO2 23 10/17/2021 0832   ACIDBASEDEF 2.0 10/07/2021 1814   O2SAT 100 10/17/2021 3094       Melodie Bouillon, MD 10/17/2021 5:32 PM

## 2021-10-17 NOTE — Progress Notes (Signed)
CPT held at this time, pt agitated.

## 2021-10-17 NOTE — Progress Notes (Addendum)
Patient ID: [name], male   DOB: 07/21/1960, 61 y.o.   MRN: [id number]    Advanced Heart Failure Rounding Note   Subjective:    - 8/19 Pericardial window - 8/20 Cardiac arrest with tamponade -> Emergent bedside washout - 09/29/21 VA Cannulation - 09/30/21 Return to OR for mediastinal hemorrhage - 10/01/21 Developed AF -> amio - 10/02/21 TEE EF 25-30%  - 10/04/21 OR for washout. C/b continued bleeding overnight - 10/07/21 Placement of Impella 5.5 with washout, VA ECMO decannulation. Hypotensive with development of severe RV dysfunction after ECMO off and pressors titrated up. Multiple units of blood products. - 10/08/21 Brief PEA arrest. AFL with RVR >> S/p DCCV to SR, back in AFL shortly after - 8/31 Give 1UPRBCs  - 9/2 OR for chest closure - 9/3 Hypotensive overnight w/ SBPs in 80s. Febrile, mTemp 100.8. CRRT paused. VP increased to 0.04.  - 9/4 s/p bronchoscopy w/ BAL by PCCM, Cx NGTD  - 9/5 vomiting w/ large volume NGT output + watery/foul diarrhea, Tube feeds held. No signs of ileus on KUB. C-diff negative   - 9/7 OR for Impella Extraction and percutaneous tracheostomy   Early AM co-ox w/ Impella + 2 mcg of Epi was 87%   Just returned from OR. Impella extracted. Trach placed. FiO2 40%   Awake and following commands.   Off EPi. MAPs elevated 100s.   CVP 15-16. RN restarting on CRRT.   Remains on Vanc + meropenum. WBC remains elevated but stable at 16K. BAL cx NGTD.   NSR on amio gtt at 60/hr   Hgb 8.8    Echo 08/28: LV EF 55-60% with severe LVH, small ventricular cavity, RV appears to have near normal systolic function   Objective:     Vital Signs:   Temp:  [94.6 F (34.8 C)-99.7 F (37.6 C)] 97.3 F (36.3 C) (09/07 0700) Pulse Rate:  [71-85] 80 (09/07 0700) Resp:  [12-26] 14 (09/07 0700) SpO2:  [100 %] 100 % (09/07 0700) Arterial Line BP: (98-164)/(53-79) 164/68 (09/07 0700) FiO2 (%):  [40 %] 40 % (09/07 0400) Weight:  [88.6 kg] 88.6 kg (09/07 0500) Last BM  Date : 10/16/21  Weight change: Filed Weights   10/15/21 0433 10/16/21 0436 10/17/21 0500  Weight: 93.2 kg 87.3 kg 88.6 kg    Intake/Output:   Intake/Output Summary (Last 24 hours) at 10/17/2021 0733 Last data filed at 10/17/2021 0700 Gross per 24 hour  Intake 3264.12 ml  Output 3860 ml  Net -595.88 ml    PHYSICAL EXAM: CVP 15-6  General: awake + trach. No distress   HEENT: + trach, + cortrak Neck: supple. JVD 16 cm. Carotids 2+ bilat; no bruits. No lymphadenopathy or thyromegaly appreciated. Cor: PMI nondisplaced. Regular rate & rhythm. No rubs, gallops or murmurs. + sternal dressing/ wound vac Lungs: CTAB  Abdomen: soft, nontender, nondistended. No hepatosplenomegaly. No bruits or masses. Normal BS Extremities: no cyanosis, clubbing, rash, 1+ b/l LE edema Neuro: awake, on vent through trach collar   Telemetry: Sinus 70s Personally reviewed   Labs: Basic Metabolic Panel: Recent Labs  Lab 10/13/21 0400 10/13/21 1154 10/14/21 0235 10/14/21 1702 10/15/21 0404 10/15/21 1503 10/16/21 0407 10/16/21 1600 10/17/21 0400  NA 135   < > 137   < > 138 136 136 136 135  K 4.9   < > 5.3*   < > 4.4 4.1 4.8 4.4 4.1  CL 104   < > 104   < > 101 103 102 103 101  CO2 23   < > 25   < > _0 GLUCOSE 264*   < > 229*   < > 180* 188* 90 125* 103*  BUN 31*   < > 33*   < > 33* 33* 26* 24* 20  CREATININE 2.01*   < > 2.15*   < > 1.88* 1.73* 1.53* 1.76* 1.73*  CALCIUM 8.0*   < > 8.4*   < > 8.7* 7.8* 8.9 8.5* 8.5*  MG 2.5*  --  2.4  --  2.7*  --  2.4  --  2.5*  PHOS 3.5   < > 4.4   < > 3.1 3.7 2.7 2.4* 2.9   < > = values in this interval not displayed.    Liver Function Tests: Recent Labs  Lab 10/15/21 0404 10/15/21 1503 10/16/21 0407 10/16/21 1600 10/17/21 0400  ALBUMIN 2.5* 2.7* 2.7* 2.7* 2.5*   No results for input(s): "LIPASE", "AMYLASE" in the last 168 hours. No results for input(s): "AMMONIA" in the last 168 hours.  CBC: Recent Labs  Lab 10/14/21 0235  10/15/21 0404 10/15/21 1519 10/16/21 0407 10/17/21 0400  WBC 13.0* 16.2* 15.4* 16.3* 16.1*  HGB 9.6* 9.1* 8.4* 8.8* 7.8*  HCT 30.6* 30.0* 27.8* 27.5* 24.7*  MCV 95.0 94.9 95.2 92.0 92.5  PLT 413* 597* 603* 600* 587*    Cardiac Enzymes: No results for input(s): "CKTOTAL", "CKMB", "CKMBINDEX", "TROPONINI" in the last 168 hours.  BNP: BNP (last 3 results) Recent Labs    12/24/20 0651 09/28/21 0405  BNP 412.0* 826.6*    ProBNP (last 3 results) No results for input(s): "PROBNP" in the last 8760 hours.    Other results:  Imaging: DG CHEST PORT 1 VIEW  Result Date: 10/15/2021 CLINICAL DATA:  Vomiting EXAM: PORTABLE CHEST 1 VIEW COMPARISON:  10/15/2021, 10/14/2021, 10/13/2021, 10/12/2021 FINDINGS: Endotracheal tube tip about 4.9 cm superior to the carina. Right IJ central venous catheter tip over the SVC. Left ventricular assist device similar in position. Left IJ central venous catheter tip over the SVC origin. Esophageal tube tip below the diaphragm. Post sternotomy changes. Cardiomegaly with mild airspace disease at the left lung base, slowly improving. Aortic atherosclerosis. No pneumothorax. IMPRESSION: 1. Similar positioning of support lines and tubes as above 2. Cardiomegaly 3. Mild airspace disease at the left lung base, not significantly changed as compared with radiograph earlier today slightly improved as compared with radiographs several days ago. Electronically Signed   By: Donavan Foil M.D.   On: 10/15/2021 16:09   DG Abd Portable 1V  Result Date: 10/15/2021 CLINICAL DATA:  Vomiting EXAM: PORTABLE ABDOMEN - 1 VIEW COMPARISON:  10/04/2021 FINDINGS: Streaky opacities at the left lung base. Esophageal tube tip overlies the stomach. Feeding tube tip incompletely visualized but tubing visualized at the duodenum. Upper gas pattern is unremarkable IMPRESSION: 1. Esophageal tube tip overlies the stomach. Feeding tube tip incompletely included on the image but tubing is visualized  at least to the second portion of duodenum Electronically Signed   By: Donavan Foil M.D.   On: 10/15/2021 16:05     Medications:     Scheduled Medications:  sodium chloride   Intravenous Once   atorvastatin  80 mg Per Tube Daily   Chlorhexidine Gluconate Cloth  6 each Topical Daily   clonazePAM  1 mg Per Tube Q8H   darbepoetin (ARANESP) injection - NON-DIALYSIS  40 mcg Subcutaneous Q Mon-1800   insulin aspart  0-15 Units Subcutaneous Q4H  mouth rinse  15 mL Mouth Rinse Q2H   oxyCODONE  5 mg Per Tube Q6H   pantoprazole (PROTONIX) IV  40 mg Intravenous Q12H   QUEtiapine  50 mg Per Tube BID   sodium chloride flush  10-40 mL Intracatheter Q12H   sodium chloride flush  3 mL Intravenous Q12H    Infusions:   prismasol BGK 4/2.5 400 mL/hr at 10/16/21 2255    prismasol BGK 4/2.5 400 mL/hr at 10/17/21 6144   sodium chloride 10 mL/hr at 10/17/21 0700   sodium chloride     albumin human Stopped (10/15/21 1901)   amiodarone 60 mg/hr (10/17/21 0700)   desmopressin (DDAVP) 20 mcg in sodium chloride 0.9 % 50 mL IVPB     epinephrine 2 mcg/min (10/17/21 0700)   feeding supplement (PIVOT 1.5 CAL) Stopped (10/17/21 0001)   HYDROmorphone 4 mg/hr (10/17/21 0700)   meropenem (MERREM) IV Stopped (10/17/21 0242)   prismasol BGK 4/2.5 1,800 mL/hr at 10/17/21 0511   sodium bicarbonate 25 mEq (Impella PURGE) in dextrose 5 % 1000 mL bag     vancomycin Stopped (10/16/21 2117)   vasopressin Stopped (10/16/21 0244)    PRN Medications: sodium chloride, albumin human, artificial tears, dextrose, heparin, HYDROmorphone, ipratropium-albuterol, midazolam, ondansetron (ZOFRAN) IV, sodium chloride, sodium chloride flush, sodium chloride flush   Assessment/Plan:    1. Shock - mixed cardiogenic/hemorrhagic -> VA ECMO -> decannulated on 8/28 to Impella 5.5 - Echo 08/28: Underfilled LV, EF 55-60% with severe LVH and near normal RV systolic function.  - Impella extracted 9/7 - Now off Epi in setting of  elevated MAPs - follow co-ox (87% prior to removal of support)  - CVP 15-16, continue CRRT for volume removal  - can use IV hydralazine PRN if MAP remains markedly elevated, though suspect will improve w/ resumption of CRRT for volume removal and pain control      2. Cardiac arrest (PEA/bradycardic) - 8/19 in setting of tamponade - PEA arrest again on 08/28  3. Cardiac tamponade with emergent bedside sternotomy  - Diffuse epicardial bleeding with post-op Dresslers - return to OR 8/21 and 8/24 for washouts.  - Washout 8/28 in OR. Multiple units of blood products in OR.   - CT/MT minimal blood output.  - Chest closed on 9/2  4. Acute hypoxemic respiratory failure - Off ECMO.  - Vent support per CCM - Perc Trach placed 9/7  - remains on Vanc + meropenum - s/p bronchoscopy w/ BAL 9/4. Cx NGTD   5. AKI due to ATN - CRRT started 08/28.  - remains anuric. Volume up, CVP 16  - continue CRRT, aim for net negative fluid balance today  - Nephrology following  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 Continues on colchicine.  7. 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 ASA held as above  8. DM2 - continue SSI + levemir  9. PAF/AFL - Tolerates poorly.  - Recurrent AFL. S/p DCCV  to SR 08/29.  - SR today.   - now off Epi, can wean amio to 30/hr  - No AC for now  10. ID - On vancomycin/meropenem with recent open chest.   11. Neuro -off sedation, following commands   12. FEN - TFs ongoing @ goal  13. Hyperkalemia - resolved, K 4.0 today  - continue CRRT   14. GI  - vomiting w/ large volume NGT output + watery/foul diarrhea 9/5. KUB negative for ileus - C-diff negative  -  now off Reglan, improving     Length of Stay: [place] PA-C  10/17/2021, 7:33 AM  Advanced Heart Failure Team Pager [phone] (M-F; 7a - 4p)  Please contact [facility] for night-coverage after hours (4p -7a ) and weekends on [email or web address]    Patient seen and examined with the above-signed Advanced Practice Provider and/or Housestaff. I personally reviewed laboratory data, imaging studies and relevant notes. I independently examined the patient and formulated the important aspects of the plan. I have edited the note to reflect any of my changes or salient points. I have personally discussed the plan with the patient and/or family.  Had Impella removed this am and underwent trach.   Awake on vent following some commands. Epi. Remains on CRRT. CVP 15-16.   General:  Weak appearing. On vent through trach  HEENT: normal Neck: supple.+ trach RIJ HD cath  Carotids 2+ bilat; no bruits. No lymphadenopathy or thryomegaly appreciated. MCR:FVOHKGO site ok. Sternal wound with Praveena  Regular rate & rhythm. No rubs, gallops or murmurs. Lungs: clear Abdomen: soft, nontender, nondistended. No hepatosplenomegaly. No bruits or masses. Good bowel sounds. Extremities: no cyanosis, clubbing, rash, 2+ edema Neuro: alert follows some commands  Now s/p Impella extraction. LV normal function RV moderately decreased,. Still with some volume on board. Continue CRRT. Follow CVP and co-ox. Try to mobilize   CRITICAL CARE Performed by: Glori Bickers  Total critical care time: 35 minutes  Critical care time was exclusive of separately billable procedures and treating other patients.  Critical care was necessary to treat or prevent imminent or life-threatening deterioration.  Critical care was time spent personally by me (independent of midlevel providers or residents) on the following activities: development of treatment plan with patient and/or surrogate as well as nursing, discussions with consultants, evaluation of patient's response to treatment, examination of patient, obtaining history from patient or surrogate, ordering and performing treatments and interventions, ordering and review of laboratory studies, ordering and review of radiographic  studies, pulse oximetry and re-evaluation of patient's condition.  Glori Bickers, MD  5:38 PM

## 2021-10-18 DIAGNOSIS — G9341 Metabolic encephalopathy: Secondary | ICD-10-CM

## 2021-10-18 LAB — LACTATE DEHYDROGENASE: LDH: 264 U/L — ABNORMAL HIGH (ref 98–192)

## 2021-10-18 LAB — COOXEMETRY PANEL
Carboxyhemoglobin: 1.8 % — ABNORMAL HIGH (ref 0.5–1.5)
Carboxyhemoglobin: 1.3 % (ref 0.5–1.5)
Carboxyhemoglobin: 2.1 % — ABNORMAL HIGH (ref 0.5–1.5)
Methemoglobin: <0.7 % (ref 0.0–1.5)
Methemoglobin: <0.7 % (ref 0.0–1.5)
Methemoglobin: <0.7 % (ref 0.0–1.5)
O2 Saturation: 100 %
O2 Saturation: 54.4 %
O2 Saturation: 68.8 %
Total hemoglobin: 9.7 g/dL — ABNORMAL LOW (ref 12.0–16.0)
Total hemoglobin: 9.4 g/dL — ABNORMAL LOW (ref 12.0–16.0)
Total hemoglobin: 9.9 g/dL — ABNORMAL LOW (ref 12.0–16.0)

## 2021-10-18 LAB — RENAL FUNCTION PANEL
Albumin: 2.4 g/dL — ABNORMAL LOW (ref 3.5–5.0)
Albumin: 2.5 g/dL — ABNORMAL LOW (ref 3.5–5.0)
Anion gap: 10 (ref 5–15)
Anion gap: 11 (ref 5–15)
BUN: 24 mg/dL — ABNORMAL HIGH (ref 8–23)
BUN: 29 mg/dL — ABNORMAL HIGH (ref 8–23)
CO2: 23 mmol/L (ref 22–32)
CO2: 23 mmol/L (ref 22–32)
Calcium: 7.8 mg/dL — ABNORMAL LOW (ref 8.9–10.3)
Calcium: 8.4 mg/dL — ABNORMAL LOW (ref 8.9–10.3)
Chloride: 103 mmol/L (ref 98–111)
Chloride: 102 mmol/L (ref 98–111)
Creatinine, Ser: 1.79 mg/dL — ABNORMAL HIGH (ref 0.61–1.24)
Creatinine, Ser: 2.44 mg/dL — ABNORMAL HIGH (ref 0.61–1.24)
GFR, Estimated: 43 mL/min — ABNORMAL LOW (ref >60)
GFR, Estimated: 29 mL/min — ABNORMAL LOW (ref >60)
Glucose, Bld: 152 mg/dL — ABNORMAL HIGH (ref 70–99)
Glucose, Bld: 180 mg/dL — ABNORMAL HIGH (ref 70–99)
Phosphorus: 2.8 mg/dL (ref 2.5–4.6)
Phosphorus: 2.7 mg/dL (ref 2.5–4.6)
Potassium: 4 mmol/L (ref 3.5–5.1)
Potassium: 4.3 mmol/L (ref 3.5–5.1)
Sodium: 136 mmol/L (ref 135–145)
Sodium: 136 mmol/L (ref 135–145)

## 2021-10-18 LAB — TYPE AND SCREEN
ABO/RH(D): O POS
Antibody Screen: NEGATIVE
Unit division: 0
Unit division: 0

## 2021-10-18 LAB — GLUCOSE, CAPILLARY
Glucose-Capillary: 150 mg/dL — ABNORMAL HIGH (ref 70–99)
Glucose-Capillary: 93 mg/dL (ref 70–99)
Glucose-Capillary: 136 mg/dL — ABNORMAL HIGH (ref 70–99)
Glucose-Capillary: 156 mg/dL — ABNORMAL HIGH (ref 70–99)
Glucose-Capillary: 126 mg/dL — ABNORMAL HIGH (ref 70–99)

## 2021-10-18 LAB — CBC
HCT: 28.7 % — ABNORMAL LOW (ref 39.0–52.0)
HCT: 28.4 % — ABNORMAL LOW (ref 39.0–52.0)
Hemoglobin: 9.4 g/dL — ABNORMAL LOW (ref 13.0–17.0)
Hemoglobin: 9.1 g/dL — ABNORMAL LOW (ref 13.0–17.0)
MCH: 29.8 pg (ref 26.0–34.0)
MCH: 29.6 pg (ref 26.0–34.0)
MCHC: 32.8 g/dL (ref 30.0–36.0)
MCHC: 32 g/dL (ref 30.0–36.0)
MCV: 91.1 fL (ref 80.0–100.0)
MCV: 92.5 fL (ref 80.0–100.0)
Platelets: 481 10*3/uL — ABNORMAL HIGH (ref 150–400)
Platelets: 528 10*3/uL — ABNORMAL HIGH (ref 150–400)
RBC: 3.15 MIL/uL — ABNORMAL LOW (ref 4.22–5.81)
RBC: 3.07 MIL/uL — ABNORMAL LOW (ref 4.22–5.81)
RDW: 16.1 % — ABNORMAL HIGH (ref 11.5–15.5)
RDW: 16.4 % — ABNORMAL HIGH (ref 11.5–15.5)
WBC: 16.9 10*3/uL — ABNORMAL HIGH (ref 4.0–10.5)
WBC: 19.1 10*3/uL — ABNORMAL HIGH (ref 4.0–10.5)
nRBC: 1.2 % — ABNORMAL HIGH (ref 0.0–0.2)
nRBC: 0.9 % — ABNORMAL HIGH (ref 0.0–0.2)

## 2021-10-18 LAB — BPAM RBC
Blood Product Expiration Date: 202310052359
Blood Product Expiration Date: 202310052359
ISSUE DATE / TIME: 202309070729
ISSUE DATE / TIME: 202309070729
Unit Type and Rh: 5100
Unit Type and Rh: 5100

## 2021-10-18 LAB — MAGNESIUM: Magnesium: 2.3 mg/dL (ref 1.7–2.4)

## 2021-10-18 LAB — PROTIME-INR
INR: 1.3 — ABNORMAL HIGH (ref 0.8–1.2)
Prothrombin Time: 16.2 seconds — ABNORMAL HIGH (ref 11.4–15.2)

## 2021-10-18 MED ORDER — SODIUM CHLORIDE 0.9 % IV SOLN
1.0000 g | Freq: Once | INTRAVENOUS | Status: AC
  Administered 2021-10-18: 1 g via INTRAVENOUS
  Filled 2021-10-18: qty 20

## 2021-10-18 MED ORDER — VITAL 1.5 CAL PO LIQD
1000.0000 mL | ORAL | Status: DC
  Administered 2021-10-18: 1000 mL

## 2021-10-18 MED ORDER — BANATROL TF EN LIQD
60.0000 mL | Freq: Two times a day (BID) | ENTERAL | Status: DC
Start: 2021-10-18 — End: 2021-10-20
  Administered 2021-10-18 – 2021-10-19 (×3): 60 mL
  Filled 2021-10-18 (×4): qty 60

## 2021-10-18 MED ORDER — QUETIAPINE FUMARATE 50 MG PO TABS: 50.0000 mg | ORAL_TABLET | Freq: Every day | ORAL | Status: DC

## 2021-10-18 MED ORDER — DARBEPOETIN ALFA 60 MCG/0.3ML IJ SOSY
60.0000 ug | PREFILLED_SYRINGE | INTRAMUSCULAR | Status: DC
  Administered 2021-10-21: 60 ug via SUBCUTANEOUS
  Filled 2021-10-18: qty 0.3

## 2021-10-18 MED ORDER — POLYVINYL ALCOHOL 1.4 % OP SOLN
2.0000 [drp] | OPHTHALMIC | Status: DC | PRN
  Administered 2021-11-16: 2 [drp] via OPHTHALMIC
  Filled 2021-10-18: qty 15

## 2021-10-18 MED ORDER — RENA-VITE PO TABS
1.0000 | ORAL_TABLET | Freq: Every day | ORAL | Status: DC
  Administered 2021-10-18 – 2021-11-23 (×37): 1
  Filled 2021-10-18 (×37): qty 1

## 2021-10-18 MED ORDER — HEPARIN SODIUM (PORCINE) 5000 UNIT/ML IJ SOLN
5000.0000 [IU] | Freq: Three times a day (TID) | INTRAMUSCULAR | Status: DC
  Administered 2021-10-18 – 2021-11-20 (×97): 5000 [IU] via SUBCUTANEOUS
  Filled 2021-10-18 (×96): qty 1

## 2021-10-18 MED ORDER — VANCOMYCIN HCL 750 MG/150ML IV SOLN
750.0000 mg | INTRAVENOUS | Status: AC
  Administered 2021-10-18: 750 mg via INTRAVENOUS
  Filled 2021-10-18: qty 150

## 2021-10-18 MED ORDER — SENNOSIDES 8.8 MG/5ML PO SYRP
5.0000 mL | ORAL_SOLUTION | Freq: Every day | ORAL | Status: DC
Start: 2021-10-18 — End: 2021-10-28
  Administered 2021-10-18 – 2021-10-27 (×9): 5 mL
  Filled 2021-10-18 (×10): qty 5

## 2021-10-18 MED ORDER — OXYCODONE HCL 5 MG PO TABS
10.0000 mg | ORAL_TABLET | Freq: Four times a day (QID) | ORAL | Status: DC
  Administered 2021-10-18 – 2021-10-19 (×4): 10 mg
  Filled 2021-10-18 (×4): qty 2

## 2021-10-18 MED ORDER — QUETIAPINE FUMARATE 50 MG PO TABS
50.0000 mg | ORAL_TABLET | Freq: Every day | ORAL | Status: DC
Start: 2021-10-18 — End: 2021-10-20
  Administered 2021-10-18 – 2021-10-19 (×2): 50 mg
  Filled 2021-10-18 (×2): qty 1

## 2021-10-18 MED ORDER — PROSOURCE TF20 ENFIT COMPATIBL EN LIQD
60.0000 mL | Freq: Three times a day (TID) | ENTERAL | Status: DC
  Administered 2021-10-18 – 2021-11-25 (×93): 60 mL
  Filled 2021-10-18 (×97): qty 60

## 2021-10-18 NOTE — Progress Notes (Signed)
SLP Cancellation Note  Patient Details Name: PHELIX FUDALA MRN: [id number] DOB: 06/29/1960   Cancelled treatment:        Pt continues on ventilator support. Will check first next week to progress in the areas of upper airway usage/phonation, swallow when able.    Houston Siren 10/18/2021, 11:28 AM

## 2021-10-18 NOTE — Progress Notes (Signed)
      [address]       [place] [address]             [phone]      Agitation/ delirium persists  BP 98/68   Pulse 77   Temp 99.2 F (37.3 C) (Axillary)   Resp (!) 23   Ht 5\' 7"  (1.702 m)   Wt 90 kg   SpO2 100%   BMI 31.08 kg/m  Epi @ 6 Dilaudid at 2  On CPAP for awhile today, now back on PRVC   Intake/Output Summary (Last 24 hours) at 10/18/2021 1742 Last data filed at 10/18/2021 1700 Gross per 24 hour  Intake 2348.53 ml  Output 1754 ml  Net 594.53 ml   CVVH filter clotted- groin line removed for line holiday  Continue current care  Chrishaun Sasso C. Roxan Hockey, MD [facility] [phone]

## 2021-10-18 NOTE — Anesthesia Postprocedure Evaluation (Signed)
Anesthesia Post Note  Patient: ARLINGTON SIGMUND  Procedure(s) Performed: REMOVAL OF IMPELLA LEFT VENTRICULAR ASSIST DEVICE (Right: Axilla) TRANSESOPHAGEAL ECHOCARDIOGRAM (TEE) PERCUTANEOUS TRACHEOSTOMY USING SHILEY FLEXIBLE 8 mm CUFFED TRACH. (Neck)     Patient location during evaluation: SICU Anesthesia Type: General Level of consciousness: sedated Pain management: pain level controlled Vital Signs Assessment: post-procedure vital signs reviewed and stable Respiratory status: patient remains intubated per anesthesia plan Cardiovascular status: stable Postop Assessment: no apparent nausea or vomiting Anesthetic complications: no   No notable events documented.  Last Vitals:  Vitals:   10/18/21 0700 10/18/21 0715  BP: 113/62   Pulse: 72 72  Resp: 14 16  Temp:    SpO2: 100% 100%    Last Pain:  Vitals:   10/17/21 1600  TempSrc: Axillary  PainSc:                  Santa Lighter

## 2021-10-18 NOTE — Progress Notes (Signed)
CRRT filter alarmed filter clotted. Unable to return blood to pt. Orders once filter clotted to not restart CRRT and d/c fem [place] for line holiday. CRRT now off and line has been removed.

## 2021-10-18 NOTE — Plan of Care (Signed)
Maysel Mccolm, RN 

## 2021-10-18 NOTE — Progress Notes (Signed)
1 Day Post-Op Procedure(s) (LRB): REMOVAL OF IMPELLA LEFT VENTRICULAR ASSIST DEVICE (Right) TRANSESOPHAGEAL ECHOCARDIOGRAM (TEE) (N/A) PERCUTANEOUS TRACHEOSTOMY USING SHILEY FLEXIBLE 8 mm CUFFED TRACH. (N/A) Subjective: Alert and responding to commands  Objective: Vital signs in last 24 hours: Temp:  [95.2 F (35.1 C)-98 F (36.7 C)] 97.6 F (36.4 C) (09/08 0500) Pulse Rate:  [64-76] 72 (09/08 0715) Cardiac Rhythm: Normal sinus rhythm (09/07 2000) Resp:  [13-22] 16 (09/08 0715) BP: (79-118)/(44-62) 113/62 (09/08 0700) SpO2:  [92 %-100 %] 100 % (09/08 0715) Arterial Line BP: (91-195)/(37-69) 142/58 (09/08 0715) FiO2 (%):  [40 %] 40 % (09/08 0341) Weight:  [90 kg] 90 kg (09/08 0500)  Hemodynamic parameters for last 24 hours: CVP:  [10 mmHg-21 mmHg] 10 mmHg  Intake/Output from previous day: 09/07 0701 - 09/08 0700 In: 3060.3 [I.V.:1492.1; Blood:630; NG/GT:532.8; IV Piggyback:366.8] Out: 2305 [Emesis/NG output:350; Drains:15] Intake/Output this shift: Total I/O In: 156.9 [I.V.:43; NG/GT:30.5; IV Piggyback:83.4] Out: -   General appearance: cooperative and no distress Neurologic: some right preference but moves all 4 to command Heart: regular rate and rhythm Lungs: coarse BS Conjunctival hemorrhage- better per RN  Lab Results: Recent Labs    10/17/21 0400 10/17/21 0828 10/17/21 0832 10/18/21 0331  WBC 16.1*  --   --  16.9*  HGB 7.8*   < > 8.8* 9.4*  HCT 24.7*   < > 26.0* 28.7*  PLT 587*  --   --  481*   < > = values in this interval not displayed.   BMET:  Recent Labs    10/17/21 1744 10/18/21 0331  NA 136 136  K 3.9 4.0  CL 104 103  CO2 23 23  GLUCOSE 124* 152*  BUN 22 24*  CREATININE 1.70* 1.79*  CALCIUM 8.2* 7.8*    PT/INR:  Recent Labs    10/18/21 0331  LABPROT 16.2*  INR 1.3*   ABG    Component Value Date/Time   PHART 7.375 10/17/2021 0828   HCO3 25.0 10/17/2021 0828   TCO2 23 10/17/2021 0832   ACIDBASEDEF 2.0 10/07/2021 1814   O2SAT  54.4 10/18/2021 0530   CBG (last 3)  Recent Labs    10/17/21 1618 10/17/21 2015 10/18/21 0001  GLUCAP 109* 119* 150*    Assessment/Plan: S/P Procedure(s) (LRB): REMOVAL OF IMPELLA LEFT VENTRICULAR ASSIST DEVICE (Right) TRANSESOPHAGEAL ECHOCARDIOGRAM (TEE) (N/A) PERCUTANEOUS TRACHEOSTOMY USING SHILEY FLEXIBLE 8 mm CUFFED TRACH. (N/A) - POD # 1 Impella removal CV- back on low dose epi overnight, currently on 1 mcg/min  Co-ox 54 RESP- VDRF- trach yesterday  Vent per CCM RENAL- on CVVH. Slightly + yesterday- not surprising with OR ENDo- CBG well controlled GI/ Nutrition- on TF with postpyloric tube NEURO- delirium as expected in the clinical setting  Reduce sedation Deconditioning- PT/OT ID- on Vanco and Meropenem  LOS: 20 days    Melrose Nakayama 10/18/2021

## 2021-10-18 NOTE — Progress Notes (Signed)
NAME:  [name], MRN:  [id number], DOB:  August 15, 1960, LOS: 41 ADMISSION DATE:  09/28/2021, CONSULTATION DATE:  8/20 REFERRING MD:  Kipp Brood, CHIEF COMPLAINT:  dyspnea   History of Present Illness:  61 y/o male underwent CABG on 8/3, readmitted on 8/19 in setting of dyspnea with a pericardial effusion requiring pericardial window via bedside sternotomy.  Has had pleural effusions, developed PEA arrest on 8/20, briefly required VA ECMO which was discontinued on 8/29, impella placed, started on CRRT.  Hypoxemia, repeat cardiac arrest on 8/30.    Pertinent  Medical History  CAD s/p CABG 8/3 DM2 Diverticulosis GERD Hyperlipidemia Hypertension History of stroke Tobacco Abuse  Significant Hospital Events: Including procedures, antibiotic start and stop dates in addition to other pertinent events   8/19 admitted, s/p pericardial window 8/20 PEA cardiac arrest, left pigtail chest tube placement, PEA cardiac arrest due to tamponade, bedside sternotomy with pericardial drains placed. 8/21 return to OR for overnight bleeding.  8/22 minimal chest tube output.  Atrial fibrillation, controled with amiodarone.  8/22 TEE showed EF 35% at baseline, which improved significantly with decreasing ECMO flow.  8/23 tolerated diuresis  8/24 mediastinal washout, removal of hematoma 8/25 ongoing bleeding issues from posterior mediastinum 8/29 decannulated and off ECMO, 5.5 Impella placed, L femoral HD cath placed and started on CRRT 8/30 hypoxia and hypotension followed by brief arrest, gentle L lateral chest compressions performed and ROSC in roughly 1 minute. 8/31 remains deeply sedated on ventilator with open chest.  No acute events overnight 9/1 no acute events overnight, remains on ventilator deep sedation.  Tolerating aggressive volume removal per CRRT 9/2 sternotomy closure 9/3 wean versed 9/4 FOB, low grade temps, notable thick mucus/frequent suctioning 9/5 Vomiting, ?mucus plugging. PSV 5-6  hours on 10/5 9/6 impella down to p3 9/7 impella removal and trach. PRBC and DDAVP in OR  9/8 on low dose epi. PSV   Interim History / Subjective:   Impella out yesterday Trach yesterday  Still on low dose Epi. Cr up to 1.79 (slight bump form 1.7)  SBC stable at 16  2 coox back this morning -- 0300 is 100%, 0530 is 55%   Objective   Blood pressure 100/85, pulse 73, temperature 98.7 F (37.1 C), temperature source Axillary, resp. rate 13, height 5\' 7"  (1.702 m), weight 90 kg, SpO2 100 %. CVP:  [10 mmHg-21 mmHg] 13 mmHg  Vent Mode: PSV;CPAP FiO2 (%):  [40 %] 40 % Set Rate:  [16 bmp] 16 bmp Vt Set:  [530 mL] 530 mL PEEP:  [8 cmH20] 8 cmH20 Pressure Support:  [8 cmH20] 8 cmH20 Plateau Pressure:  [19 cmH20-26 cmH20] 20 cmH20   Intake/Output Summary (Last 24 hours) at 10/18/2021 0930 Last data filed at 10/18/2021 0800 Gross per 24 hour  Intake 2942.66 ml  Output 2474 ml  Net 468.66 ml   Filed Weights   10/16/21 0436 10/17/21 0500 10/18/21 0500  Weight: 87.3 kg 88.6 kg 90 kg    Examination:  General: Critically ill middle aged M trach/vent NAD  Neuro: Awake alert following commands. Pupils 96mm  HEENT: NCAT. Injected sclera. Trach secure. L IJ CVC. R nare NGT L nare Cortrak  CV:  rr s1s2 cap refill < 3 sec  PULM: Few Upper lobe rhonchi. Even unlabored on PSV GI: soft round + bowel sounds  Extremities: No acute joint deformity. No pitting edema. Fem HD cath  Skin: c/d/w. Midline sternal wound vac   Resolved Hospital Problem list   Acute RV  failure > resolved  Assessment & Plan:   Acute metabolic encephalopathy, improving  Follows commands and very easy to redirect.  P -RASS goal 0 -wean continuous dilaudid. Ceiling decr. Incr enteral Oxy to prevent withdrawals but doesn't need much by way of continuous sedation, think we can be moe aggressive w wean -on BID seroquel -delirium precautions  -PT/OT, mobility  -bed in chair, face window, etc  Hemorrhagic pericarditis  with tamponade PEA arrest due to tamponade, s/p emergent sternotomy Cardiogenic & hemorrhagic shock, s/p VA ECMO, s/p Impella CAD s/p CABG x3 Afib  -decannulated 8/29. Impella out 9/7 P -wean Epi as able-- MAP goal > 65. If central access holiday achieved this weekend, consider peripheral NE if needed  -trend Coox, CVP while L IJ in place  -amio, statin -vanc, mero   Acute respiratory failure with hypoxemia and hypercarbia S/p Tracheostomy  P -weaning on went 9/8 -Hope to progress to TCT this weekend   -VAP, pulm hygiene  -HTS, CPT.   AKI with anuria  P -goal net even volume status -d/w nephro RE trial off CRRT (which would help facilitate dc of fem iHD cath). Plan to dc fem HD cath 9/8 and monitor over weekend  ABLA Thrombocytosis  -DDAVP and PRBC in OR 9/8 P -follow CBC -some concern re clotting in cRRT circuit vs recent hemorrhaging + discussion of ?heparin vs citrate in circuit. Defer with plan to dc catheter 9/8  Vomiting Diarrhea, improving -mild bloody outpt from OGT, likely sxn trauma. Imaging not reflective on ileus  -Cdiff neg  P -EN via cortrak -add back a mild bowel reg as stool outpt has dropped off.   DM II P -SSI  At Risk Malnutrition  -EN via cortrak   Lines/Tubes/Drains -sternal wound vac  -FMS (8/25)  -trach (9/7). Routine trach care -L Fem HD cath (8/28) -L IJ CVC (8/20)  -L rad art line (8/19)  P -would be great to minimize devices as able  -plan to dc Fem HD cath 9/8, with plan to replace Monday (sooner if RRT needed urgently) -would be great if we could get off pressors and consider a central access holiday (dc the IJ CVC as well) but for 9/8 will maintain. If he remains stable, consider dc this over the weekend -routine trach care -sternal wound per cvts -maintain art line -daily eval re dc FMS   Best Practice (right click and "Reselect all SmartList Selections" daily)  Diet/type: tubefeeds  DVT prophylaxis: prophylactic heparin   GI prophylaxis: PPI Lines: Central line, temp HD cath -- order to dc. Arterial line  Foley:  N/A Code Status:  full code Last date of multidisciplinary goals of care discussion [per primary]  Wife updated via phone 9/6 on plan of care. Reviewed risks/benefits of trach, progression of trach needs etc.    CRITICAL CARE Performed by: Cristal Generous  Total critical care time: 47 minutes  Critical care time was exclusive of separately billable procedures and treating other patients. Critical care was necessary to treat or prevent imminent or life-threatening deterioration.  Critical care was time spent personally by me on the following activities: development of treatment plan with patient and/or surrogate as well as nursing, discussions with consultants, evaluation of patient's response to treatment, examination of patient, obtaining history from patient or surrogate, ordering and performing treatments and interventions, ordering and review of laboratory studies, ordering and review of radiographic studies, pulse oximetry and re-evaluation of patient's condition.  Eliseo Gum MSN, AGACNP-BC [place]  [facility] for pager  10/18/2021, 9:30 AM

## 2021-10-18 NOTE — Plan of Care (Signed)
Problem: Education: Goal: Will demonstrate proper wound care and an understanding of methods to prevent future damage Outcome: Progressing Goal: Knowledge of disease or condition will improve Outcome: Progressing Goal: Knowledge of the prescribed therapeutic regimen will improve Outcome: Progressing Goal: Individualized Educational Video(s) Outcome: Progressing   Problem: Activity: Goal: Risk for activity intolerance will decrease Outcome: Progressing   Problem: Cardiac: Goal: Will achieve and/or maintain hemodynamic stability Outcome: Progressing   Problem: Clinical Measurements: Goal: Postoperative complications will be avoided or minimized Outcome: Progressing   Problem: Respiratory: Goal: Respiratory status will improve Outcome: Progressing   Problem: Skin Integrity: Goal: Wound healing without signs and symptoms of infection Outcome: Progressing Goal: Risk for impaired skin integrity will decrease Outcome: Progressing   Problem: Urinary Elimination: Goal: Ability to achieve and maintain adequate renal perfusion and functioning will improve Outcome: Progressing   Problem: Education: Goal: Understanding of cardiac disease, CV risk reduction, and recovery process will improve Outcome: Progressing Goal: Individualized Educational Video(s) Outcome: Progressing   Problem: Activity: Goal: Ability to tolerate increased activity will improve Outcome: Progressing   Problem: Cardiac: Goal: Ability to achieve and maintain adequate cardiovascular perfusion will improve Outcome: Progressing   Problem: Health Behavior/Discharge Planning: Goal: Ability to safely manage health-related needs after discharge will improve Outcome: Progressing   Problem: Education: Goal: Knowledge of General Education information will improve Description: Including pain rating scale, medication(s)/side effects and non-pharmacologic comfort measures Outcome: Progressing   Problem: Health  Behavior/Discharge Planning: Goal: Ability to manage health-related needs will improve Outcome: Progressing   Problem: Clinical Measurements: Goal: Ability to maintain clinical measurements within normal limits will improve Outcome: Progressing Goal: Will remain free from infection Outcome: Progressing Goal: Diagnostic test results will improve Outcome: Progressing Goal: Respiratory complications will improve Outcome: Progressing Goal: Cardiovascular complication will be avoided Outcome: Progressing   Problem: Activity: Goal: Risk for activity intolerance will decrease Outcome: Progressing   Problem: Nutrition: Goal: Adequate nutrition will be maintained Outcome: Progressing   Problem: Coping: Goal: Level of anxiety will decrease Outcome: Progressing   Problem: Elimination: Goal: Will not experience complications related to bowel motility Outcome: Progressing Goal: Will not experience complications related to urinary retention Outcome: Progressing   Problem: Pain Managment: Goal: General experience of comfort will improve Outcome: Progressing   Problem: Safety: Goal: Ability to remain free from injury will improve Outcome: Progressing   Problem: Skin Integrity: Goal: Risk for impaired skin integrity will decrease Outcome: Progressing   Problem: Education: Goal: Ability to describe self-care measures that may prevent or decrease complications (Diabetes Survival Skills Education) will improve Outcome: Progressing Goal: Individualized Educational Video(s) Outcome: Progressing   Problem: Coping: Goal: Ability to adjust to condition or change in health will improve Outcome: Progressing   Problem: Fluid Volume: Goal: Ability to maintain a balanced intake and output will improve Outcome: Progressing   Problem: Health Behavior/Discharge Planning: Goal: Ability to identify and utilize available resources and services will improve Outcome: Progressing Goal:  Ability to manage health-related needs will improve Outcome: Progressing   Problem: Metabolic: Goal: Ability to maintain appropriate glucose levels will improve Outcome: Progressing   Problem: Nutritional: Goal: Maintenance of adequate nutrition will improve Outcome: Progressing Goal: Progress toward achieving an optimal weight will improve Outcome: Progressing   Problem: Skin Integrity: Goal: Risk for impaired skin integrity will decrease Outcome: Progressing   Problem: Tissue Perfusion: Goal: Adequacy of tissue perfusion will improve Outcome: Progressing   Problem: Education: Goal: Knowledge about tracheostomy care/management will improve Outcome: Progressing   Problem: Activity: Goal:  Ability to tolerate increased activity will improve Outcome: Progressing

## 2021-10-18 NOTE — Progress Notes (Addendum)
Patient ID: [name], male   DOB: 1960-04-14, 61 y.o.   MRN: [id number]    Advanced Heart Failure Rounding Note   Subjective:    - 8/19 Pericardial window - 8/20 Cardiac arrest with tamponade -> Emergent bedside washout - 09/29/21 VA Cannulation - 09/30/21 Return to OR for mediastinal hemorrhage - 10/01/21 Developed AF -> amio - 10/02/21 TEE EF 25-30%  - 10/04/21 OR for washout. C/b continued bleeding overnight - 10/07/21 Placement of Impella 5.5 with washout, VA ECMO decannulation. Hypotensive with development of severe RV dysfunction after ECMO off and pressors titrated up. Multiple units of blood products. - 10/08/21 Brief PEA arrest. AFL with RVR >> S/p DCCV to SR, back in AFL shortly after - 8/31 Give 1UPRBCs  - 9/2 OR for chest closure - 9/3 Hypotensive overnight w/ SBPs in 80s. Febrile, mTemp 100.8. CRRT paused. VP increased to 0.04.  - 9/4 s/p bronchoscopy w/ BAL by PCCM, Cx NGTD  - 9/5 vomiting w/ large volume NGT output + watery/foul diarrhea, Tube feeds held. No signs of ileus on KUB. C-diff negative   - 9/7 OR for Impella Extraction and percutaneous tracheostomy. 2 u RBCs.  Low-dose Epi off yesterday afternoon, back on last night d/t drop in BP shortly after pain meds.   CO-OX 54% on Epi 2>>now down to 1  MAP 70s  CVP 13. 1.9L volume removed with CRRT yesterday.   Remains on Vanc + meropenum. WBC remains elevated but stable at 16K. BAL cx NGTD.   NSR on amio gtt at 60/hr   Hgb 7.8>8.8>9.4  Awake and following commands. Nodding to questions. Denies pain.   Echo 08/28: LV EF 55-60% with severe LVH, small ventricular cavity, RV appears to have near normal systolic function   Objective:     Vital Signs:   Temp:  [95.2 F (35.1 C)-98 F (36.7 C)] 97.6 F (36.4 C) (09/08 0500) Pulse Rate:  [64-76] 72 (09/08 0715) Resp:  [13-22] 16 (09/08 0715) BP: (79-118)/(44-62) 113/62 (09/08 0700) SpO2:  [92 %-100 %] 100 % (09/08 0715) Arterial Line BP: (91-195)/(37-69)  142/58 (09/08 0715) FiO2 (%):  [40 %] 40 % (09/08 0341) Weight:  [90 kg] 90 kg (09/08 0500) Last BM Date : 10/17/21  Weight change: Filed Weights   10/16/21 0436 10/17/21 0500 10/18/21 0500  Weight: 87.3 kg 88.6 kg 90 kg    Intake/Output:   Intake/Output Summary (Last 24 hours) at 10/18/2021 0733 Last data filed at 10/18/2021 6712 Gross per 24 hour  Intake 3208.57 ml  Output 2305 ml  Net 903.57 ml    PHYSICAL EXAM: General:  Awake. No acute distress. HEENT: + trach Neck: supple. JVP 12-14. Carotids 2+ bilat; no bruits. L IJ CVC Cor: PMI nondisplaced. Regular rate & rhythm. No rubs, gallops or murmurs. Lungs: clear Abdomen: soft, nontender, nondistended.  Extremities: no cyanosis, clubbing, rash, trace edema, left femoral HD cath Neuro: Awake follow commands    Telemetry: SR 70s (personally reviewed)   Labs: Basic Metabolic Panel: Recent Labs  Lab 10/14/21 0235 10/14/21 1702 10/15/21 0404 10/15/21 1503 10/16/21 0407 10/16/21 1600 10/17/21 0400 10/17/21 0828 10/17/21 0832 10/17/21 1744 10/18/21 0331  NA 137   < > 138   < > 136 136 135 135 136 136 136  K 5.3*   < > 4.4   < > 4.8 4.4 4.1 4.0 4.0 3.9 4.0  CL 104   < > 101   < > 102 103 101  --  101 104 103  CO2 25   < > 24   < > _0 --   --  23 23  GLUCOSE 229*   < > 180*   < > 90 125* 103*  --  110* 124* 152*  BUN 33*   < > 33*   < > 26* 24* 20  --  21 22 24*  CREATININE 2.15*   < > 1.88*   < > 1.53* 1.76* 1.73*  --  1.90* 1.70* 1.79*  CALCIUM 8.4*   < > 8.7*   < > 8.9 8.5* 8.5*  --   --  8.2* 7.8*  MG 2.4  --  2.7*  --  2.4  --  2.5*  --   --   --  2.3  PHOS 4.4   < > 3.1   < > 2.7 2.4* 2.9  --   --  3.0 2.8   < > = values in this interval not displayed.    Liver Function Tests: Recent Labs  Lab 10/16/21 0407 10/16/21 1600 10/17/21 0400 10/17/21 1744 10/18/21 0331  ALBUMIN 2.7* 2.7* 2.5* 2.5* 2.4*   No results for input(s): "LIPASE", "AMYLASE" in the last 168 hours. No results for input(s):  "AMMONIA" in the last 168 hours.  CBC: Recent Labs  Lab 10/15/21 0404 10/15/21 1519 10/16/21 0407 10/17/21 0400 10/17/21 0828 10/17/21 0832 10/18/21 0331  WBC 16.2* 15.4* 16.3* 16.1*  --   --  16.9*  HGB 9.1* 8.4* 8.8* 7.8* 7.5* 8.8* 9.4*  HCT 30.0* 27.8* 27.5* 24.7* 22.0* 26.0* 28.7*  MCV 94.9 95.2 92.0 92.5  --   --  91.1  PLT 597* 603* 600* 587*  --   --  481*    Cardiac Enzymes: No results for input(s): "CKTOTAL", "CKMB", "CKMBINDEX", "TROPONINI" in the last 168 hours.  BNP: BNP (last 3 results) Recent Labs    12/24/20 0651 09/28/21 0405  BNP 412.0* 826.6*    ProBNP (last 3 results) No results for input(s): "PROBNP" in the last 8760 hours.    Other results:  Imaging: DG CHEST PORT 1 VIEW  Result Date: 10/17/2021 CLINICAL DATA:  Impella device removed.  New trach placed. EXAM: PORTABLE CHEST 1 VIEW COMPARISON:  10/17/2021 and earlier FINDINGS: Tracheostomy tube is in place, tip overlying the level of the proximal trachea. Feeding tube and nasogastric tube are in place, tip beyond the level of the image. LEFT IJ central line tip overlies the superior vena cava. RIGHT subclavian line has been removed. Impella device has been removed. Heart size is normal. There is patchy opacity in the LEFT lung base, similar to prior study. Cardiomegaly, stable. IMPRESSION: 1. Stable cardiomegaly. 2. Stable LEFT LOWER lobe atelectasis or infiltrate. 3. Tracheostomy tube in good position. Electronically Signed   By: Nolon Nations M.D.   On: 10/17/2021 10:54   DG Abd 1 View  Result Date: 10/17/2021 CLINICAL DATA:  Postop. Impella device removed. New tracheostomy placed. New nasogastric tube. EXAM: ABDOMEN - 1 VIEW COMPARISON:  10/15/2021 FINDINGS: Interval removal of Impella device. Nasogastric tube is in place, tip overlying the level of the stomach. Side port is below the expected location of the gastroesophageal junction. Feeding tube tip overlies the region of the descending  duodenum. There is minimal LEFT LOWER lobe atelectasis. IMPRESSION: Feeding tube and nasogastric tube as described. Electronically Signed   By: Nolon Nations M.D.   On: 10/17/2021 10:50   DG CHEST PORT 1 VIEW  Result Date: 10/17/2021 CLINICAL DATA:  Heart surgery EXAM: PORTABLE CHEST 1 VIEW COMPARISON:  10/15/2021 FINDINGS: ETT terminates within the midthoracic trachea. Enteric tube courses below the diaphragm with distal tip beyond the inferior margin of the film. Left IJ central venous catheter terminates in the proximal SVC. Impella left ventricular assist device remains in place. Stable cardiomegaly. Similar degree of left basilar opacity, likely atelectasis. No pneumothorax. IMPRESSION: 1. Stable support apparatus as above. 2. Similar degree of left basilar opacity, likely atelectasis. Electronically Signed   By: Davina Poke D.O.   On: 10/17/2021 08:26     Medications:     Scheduled Medications:  sodium chloride   Intravenous Once   atorvastatin  80 mg Per Tube Daily   Chlorhexidine Gluconate Cloth  6 each Topical Daily   clonazePAM  1 mg Per Tube Q8H   [START ON 10/21/2021] darbepoetin (ARANESP) injection - NON-DIALYSIS  60 mcg Subcutaneous Q Mon-1800   insulin aspart  0-15 Units Subcutaneous Q4H   mouth rinse  15 mL Mouth Rinse Q2H   oxyCODONE  5 mg Per Tube Q6H   pantoprazole (PROTONIX) IV  40 mg Intravenous Q12H   QUEtiapine  50 mg Per Tube BID   sodium chloride flush  10-40 mL Intracatheter Q12H   sodium chloride flush  3 mL Intravenous Q12H    Infusions:   prismasol BGK 4/2.5 400 mL/hr at 10/17/21 2208    prismasol BGK 4/2.5 400 mL/hr at 10/17/21 2158   sodium chloride Stopped (10/17/21 1812)   sodium chloride     albumin human Stopped (10/15/21 1901)   amiodarone 60 mg/hr (10/18/21 0715)   epinephrine 2 mcg/min (10/18/21 0715)   feeding supplement (PIVOT 1.5 CAL) 30 mL/hr at 10/18/21 0715   HYDROmorphone 4 mg/hr (10/18/21 0732)   meropenem (MERREM) IV Stopped  (10/18/21 0426)   prismasol BGK 4/2.5 1,800 mL/hr at 10/17/21 1903   vancomycin Stopped (10/17/21 2131)   vasopressin Stopped (10/16/21 0244)    PRN Medications: sodium chloride, albumin human, artificial tears, dextrose, heparin, hydrALAZINE, HYDROmorphone, ipratropium-albuterol, midazolam, ondansetron (ZOFRAN) IV, sodium chloride, sodium chloride flush, sodium chloride flush   Assessment/Plan:    1. Shock - mixed cardiogenic/hemorrhagic -> VA ECMO -> decannulated on 8/28 to Impella 5.5 - Echo 08/28: Underfilled LV, EF 55-60% with severe LVH and near normal RV systolic function.  - Impella extracted 9/7 - Epi back on overnight d/t low BP. Now down to 1. CO-OX 54% this am. Continue to follow CO-OX with weaning Epi. Will recheck at 11 am. - CVP 13, continue CRRT for volume removal    2. Cardiac arrest (PEA/bradycardic) - 8/19 in setting of tamponade - PEA arrest again on 08/28  3. Cardiac tamponade with emergent bedside sternotomy  - Diffuse epicardial bleeding with post-op Dresslers - return to OR 8/21 and 8/24 for washouts.  - Washout 8/28 in OR. Multiple units of blood products in OR.   - CT/MT minimal blood output.  - Chest closed on 9/2  4. Acute hypoxemic respiratory failure - Off ECMO.  - Vent support per CCM - Perc Trach placed 9/7  - remains on Vanc + meropenum. Discussed with CCM. Will plan for end date 09/11 - s/p bronchoscopy w/ BAL 9/4. Cx NGTD   5. AKI due to ATN - CRRT started 08/28.  - remains anuric. 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 Continues on colchicine.  7. 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 ASA held as above  8. DM2 - continue SSI + levemir  9. PAF/AFL - Tolerates poorly.  - Recurrent AFL. S/p DCCV  to SR 08/29.  - SR today.   - now off Epi, can wean amio to 30/hr  - Not on [facility] for now, DVT  dose heparin  10. ID - On vancomycin/meropenem with recent open chest.  - Planning for end date 09/11  11. Neuro -off sedation, following commands   12. FEN - TFs ongoing @ goal  13. Hyperkalemia - resolved, K 4.0 today  - continue CRRT   14. GI  - vomiting w/ large volume NGT output + watery/foul diarrhea 9/5. KUB negative for ileus - C-diff negative  - now off Reglan, improving     Length of Stay: [employer], LINDSAY N PA-C  10/18/2021, 7:33 AM  Advanced Heart Failure Team Pager [phone] (M-F; 7a - 4p)  Please contact [facility] for night-coverage after hours (4p -7a ) and weekends on [email or web address]   Agree with above.   Impella out. On vent through trach. On CRRT. Epi at 1. Co-ox 54% CVP 13  Awake following commands  General:  Lying in bed. On vent through trach   HEENT: normal + cor-trak  Neck: supple.  + trach LIJ TLC  Cor: Impella site ok. Praveena on sternum  Regular rate & rhythm. No rubs, gallops or murmurs. Lungs: clear Abdomen: soft, nontender, nondistended. No hepatosplenomegaly. No bruits or masses. Good bowel sounds. Extremities: no cyanosis, clubbing, rash, tr edema + femoral HD cath Neuro: alert & oriented follows commands   Remains tenuous but improving slowly.  - Wean epi as tolerated. - Keep even to slightly negative on CRRT - Continue abx for 48 more hours - PT/OT - Vent management per CCM  - Continue TFs.   CRITICAL CARE Performed by: Glori Bickers  Total critical care time: 35 minutes  Critical care time was exclusive of separately billable procedures and treating other patients.  Critical care was necessary to treat or prevent imminent or life-threatening deterioration.  Critical care was time spent personally by me (independent of midlevel providers or residents) on the following activities: development of treatment plan with patient and/or surrogate as well as nursing, discussions with consultants, evaluation of patient's  response to treatment, examination of patient, obtaining history from patient or surrogate, ordering and performing treatments and interventions, ordering and review of laboratory studies, ordering and review of radiographic studies, pulse oximetry and re-evaluation of patient's condition.  Glori Bickers, MD  9:37 AM

## 2021-10-18 NOTE — Progress Notes (Addendum)
Pharmacy Antibiotic Note  [name] is a 61 y.o. male admitted on 09/28/2021 s/p pericardial window > now s/p open chest/ECMO. Pt was decannulated on 8/28, chest closed on 9/2, and Impella removed on 9/7. Pharmacy has been consulted for vancomycin and meropenem dosing.   Per HF team and CCM, will continue abx 48 hours post Impella removal. WBC is persistently elevated and pt remains afebrile. CRRT stopped today, so plan to adjust dosing for renal function and add a stop date of 9/9.  Plan: Vanc 750mg  IV Q24H x 1 Merrem 1gm IV q 24 hours x 1  Height: 5\' 7"  (170.2 cm) Weight: 90 kg (198 lb 6.6 oz) IBW/kg (Calculated) : 66.1  Temp (24hrs), Avg:97.1 F (36.2 C), Min:95.2 F (35.1 C), Max:99.2 F (37.3 C)  Recent Labs  Lab 10/15/21 0404 10/15/21 1503 10/15/21 1519 10/16/21 0407 10/16/21 1600 10/17/21 0400 10/17/21 0832 10/17/21 1744 10/18/21 0331  WBC 16.2*  --  15.4* 16.3*  --  16.1*  --   --  16.9*  CREATININE 1.88* 1.73*  --  1.53* 1.76* 1.73* 1.90* 1.70* 1.79*  LATICACIDVEN  --  1.5  --   --   --   --   --   --   --      Estimated Creatinine Clearance: 46.4 mL/min (A) (by C-G formula based on SCr of 1.79 mg/dL (H)).    No Known Allergies  Antimicrobials this admission:  8/20 Vancomycin >9/9 8/20 Meropenem > 9/9  Dose adjustments this admission:  8/23 VT 21 8/27 VT = 26 8/28 VR = 15 (~1.5 hr after CRRT started)  Microbiology results:  8/24 wound: ngtd 9/1 Resp cx: ngtd 9/6 C diff: negative  Thank you for allowing pharmacy to participate in this patient's care.  Reatha Harps, PharmD PGY2 Pharmacy Resident 10/18/2021 12:34 PM Check [email or web address] for unit specific pharmacy number

## 2021-10-18 NOTE — Progress Notes (Signed)
Patient seen today by trach team for consult.  No education is needed at this time.  All necessary equipment is at beside.   Will continue to follow for progression. Patient is still on full ventilatory support. 

## 2021-10-18 NOTE — Evaluation (Signed)
Physical Therapy Evaluation Patient Details Name: [name] MRN: [id number] DOB: 1960/10/26 Today's Date: 10/18/2021  History of Present Illness  61 yo male admitted 8/19 with SOB, bradycardia and hypotension. Pt with bil pleural effusion and pericardial effusion s/p VATS with pericardial window 8/19. 8/20 cardiac tamponade with arrest,  intubated with bedside mediastinal exploration and ECMO via fem access. OR 8/21 for reexploration due to mediastinal hemorrhage. 8/28 OR for washout with ECMO decannulation and Impella placed. 8/29 brief PEA arrest with DCCV to NSR. 9/2 chest closure. 9/7 trach and Impella removed. PMHx: CAD s/p recent CABG x5 (admitted  8/3-11/23 with post op Afib), CKD stage II, DM, HTN, AKI, Afib and CVA  Clinical Impression  Pt with flat affect able to mouth name but could not state day. No family present to confirm PLOF but was walking at last D/C after CABG. Pt with prolonged bedrest and weakness. Pt following commands intermittently with delay with significant weakness and inability to rise from sitting or balance without assist. Pt will benefit from tilt bed to maximize function as well as continued acute therapy for strengthening, transfers and function to decrease burden of care.     Vent CPAP 40%, peep 8 HR 73 SpO2 100% 100/85     Recommendations for follow up therapy are one component of a multi-disciplinary discharge planning process, led by the attending physician.  Recommendations may be updated based on patient status, additional functional criteria and insurance authorization.  Follow Up Recommendations PT at Long-term acute care hospital Can patient physically be transported by private vehicle: No    Assistance Recommended at Discharge Frequent or constant Supervision/Assistance  Patient can return home with the following  Two people to help with walking and/or transfers;Two people to help with bathing/dressing/bathroom;Direct supervision/assist for  medications management;Assistance with feeding;Assistance with cooking/housework;Direct supervision/assist for financial management;Assist for transportation    Equipment Recommendations Other (comment) (TBD with progression)  Recommendations for Other Services  OT consult    Functional Status Assessment Patient has had a recent decline in their functional status and demonstrates the ability to make significant improvements in function in a reasonable and predictable amount of time.     Precautions / Restrictions Precautions Precautions: Other (comment);Fall Precaution Comments: CRRT, sternal wound VAC, left jp drain, fem line, trach, vent, flexiseal Restrictions Weight Bearing Restrictions: No      Mobility  Bed Mobility Overal bed mobility: Needs Assistance Bed Mobility: Supine to Sit, Sit to Supine           General bed mobility comments: foot egress function of bed utilized for supine<>sit with physical assist to lift trunk from surface in sitting mod assist with poor control and maintained anterior bias.    Transfers Overall transfer level: Needs assistance   Transfers: Sit to/from Stand Sit to Stand: Total assist, +2 physical assistance, From elevated surface           General transfer comment: attempted sit to stand from foot egress with total +2 and pad however pt unable to engage bil LE to assist with power up.    Ambulation/Gait                  Stairs            Wheelchair Mobility    Modified Rankin (Stroke Patients Only)       Balance Overall balance assessment: Needs assistance Sitting-balance support: Feet supported, No upper extremity supported Sitting balance-Leahy Scale: Poor Sitting balance - Comments: mod assist sitting  balance                                     Pertinent Vitals/Pain Pain Assessment Pain Assessment: CPOT Facial Expression: Relaxed, neutral Body Movements: Absence of movements Muscle  Tension: Relaxed Compliance with ventilator (intubated pts.): Tolerating ventilator or movement Vocalization (extubated pts.): N/A CPOT Total: 0 Pain Intervention(s): Repositioned, Monitored during session    Home Living Family/patient expects to be discharged to:: Private residence Living Arrangements: Other relatives Available Help at Discharge: Family;Available 24 hours/day Type of Home: House Home Access: Stairs to enter Entrance Stairs-Rails: Left;Right;Can reach both Entrance Stairs-Number of Steps: 14   Home Layout: Two level;Bed/bath upstairs Home Equipment: None Additional Comments: home setup taken from prior admission as pt unable to provide    Prior Function Prior Level of Function : Independent/Modified Independent                     Hand Dominance        Extremity/Trunk Assessment   Upper Extremity Assessment Upper Extremity Assessment: Generalized weakness    Lower Extremity Assessment Lower Extremity Assessment: LLE deficits/detail;RLE deficits/detail RLE Deficits / Details: hip flexion grossly 2/5, knee flexion and extension grossly 2/5 LLE Deficits / Details: hip flexion 1/5, knee flexion and extension 2-/5       Communication   Communication: Tracheostomy  Cognition Arousal/Alertness: Awake/alert Behavior During Therapy: Flat affect Overall Cognitive Status: Impaired/Different from baseline Area of Impairment: Orientation, Attention, Following commands                 Orientation Level: Disoriented to, Time Current Attention Level: Focused   Following Commands: Follows one step commands inconsistently, Follows one step commands with increased time                General Comments      Exercises General Exercises - Lower Extremity Long Arc Quad: AAROM, Both, Seated, 10 reps Hip Flexion/Marching: AAROM, PROM, Right, Left, Seated, 10 reps (PROM on LLE)   Assessment/Plan    PT Assessment Patient needs continued PT  services  PT Problem List Decreased strength;Decreased mobility;Decreased safety awareness;Decreased coordination;Decreased activity tolerance;Decreased cognition;Decreased balance;Decreased knowledge of use of DME;Cardiopulmonary status limiting activity;Decreased skin integrity       PT Treatment Interventions DME instruction;Therapeutic exercise;Functional mobility training;Therapeutic activities;Patient/family education;Neuromuscular re-education;Balance training    PT Goals (Current goals can be found in the Care Plan section)  Acute Rehab PT Goals PT Goal Formulation: Patient unable to participate in goal setting Time For Goal Achievement: 11/01/21 Potential to Achieve Goals: Fair    Frequency Min 3X/week     Co-evaluation               AM-PAC PT "6 Clicks" Mobility  Outcome Measure Help needed turning from your back to your side while in a flat bed without using bedrails?: Total Help needed moving from lying on your back to sitting on the side of a flat bed without using bedrails?: Total Help needed moving to and from a bed to a chair (including a wheelchair)?: Total Help needed standing up from a chair using your arms (e.g., wheelchair or bedside chair)?: Total Help needed to walk in hospital room?: Total Help needed climbing 3-5 steps with a railing? : Total 6 Click Score: 6    End of Session   Activity Tolerance: Patient tolerated treatment well Patient left: in bed;with call bell/phone within  reach;with nursing/sitter in room Nurse Communication: Mobility status PT Visit Diagnosis: Other abnormalities of gait and mobility (R26.89);Difficulty in walking, not elsewhere classified (R26.2);Muscle weakness (generalized) (M62.81)    Time: 4403-4742 PT Time Calculation (min) (ACUTE ONLY): 31 min   Charges:   PT Evaluation $PT Eval High Complexity: 1 High PT Treatments $Therapeutic Activity: 8-22 mins        [name], PT Acute Rehabilitation Services Office:  [phone]   Lamarr Lulas 10/18/2021, 9:38 AM

## 2021-10-18 NOTE — Progress Notes (Signed)
[facility] Progress Note  Name: YOSHIAKI KREUSER MRN: [id number] DOB: December 24, 1960   Subjective:  Seen and examined on CRRT.  He had 1.9 liters UF over 9/7 with CRRT.  He has been anuric.  UF goal has been keep even to net neg 50 ml/hr and he hasn't really tolerated much overnight.  Spoke with RN at bedside.  He has been back on epi at 2 mcg/min.       Review of systems:   Unable to obtain 2/2 intubated and sedated    Intake/Output Summary (Last 24 hours) at 10/18/2021 0636 Last data filed at 10/18/2021 0601 Gross per 24 hour  Intake 3066.7 ml  Output 2348 ml  Net 718.7 ml    Vitals:  Vitals:   10/18/21 0445 10/18/21 0500 10/18/21 0545 10/18/21 0600  BP:  (!) 118/44  (!) 83/56  Pulse: 76 75 72 72  Resp: 18 17 17 19   Temp:      TempSrc:      SpO2: 100% 100% 100% 100%  Weight:  90 kg    Height:         Physical Exam:       General adult male in bed critically ill  HEENT normocephalic atraumatic  Neck supple trachea midline Lungs coarse mechanical breath sounds Heart S1S2; on pressors  Abdomen soft nontender nondistended Extremities 2+ edema diffusely Neuro - sedation currently running  Access: left femoral nontunneled catheter in place   Medications reviewed   Labs:     Latest Ref Rng & Units 10/18/2021    3:31 AM 10/17/2021    5:44 PM 10/17/2021    8:32 AM  BMP  Glucose 70 - 99 mg/dL 152  124  110   BUN 8 - 23 mg/dL 24  22  21    Creatinine 0.61 - 1.24 mg/dL 1.79  1.70  1.90   Sodium 135 - 145 mmol/L 136  136  136   Potassium 3.5 - 5.1 mmol/L 4.0  3.9  4.0   Chloride 98 - 111 mmol/L 103  104  101   CO2 22 - 32 mmol/L 23  23    Calcium 8.9 - 10.3 mg/dL 7.8  8.2       Assessment/Plan:   Pt is a 61 y.o. yo male  with history of HTN, HLD, DM, A-fib, stroke, CKD, CAD multivessel dz status post CABG on 8/3 presented with shortness of breath, found to have cardiac tamponade, course complicated by cardiac arrest, use of ECMO, seen as a consultation for the  evaluation of AKI and fluid volume management.   #Acute kidney injury on CKD IIIa, nonoliguric: Ischemic ATN due to cardiogenic shock/cardiac arrest and multiple hemodynamic changes.  Started CRRT on 8/28 for decreasing urine output and fluid/volume management - Continue CRRT.  UF goal keep even to net negative 50 ml/hr as tolerated.   - Is his dialysis catheter able to be moved from the left femoral to his neck?   - on all 4 K fluids  - not on anticoagulation with CRRT and note concern for bleeding as above   #Cardiac tamponade/hemorrhagic pericarditis status post pericardial drain placement with subsequent ECMO placement.  decannulated  with placement of Impella. On colchicine per cardiology S/p closure of the sternum and mediastinal washout on 9/2.   #Cardiogenic shock/cardiac arrest: Was on ECMO before. Continue pressors per primary team.  Impella out on 9/7    #Acute hypoxic respiratory failure: Currently intubated and sedated.  Vent per pulmonary.  Optimize volume status with CRRT    #Acute blood loss anemia: Monitor hemoglobin and transfuse as needed by ICU team.  Aranesp 40 mcg weekly on Mondays started on 9/4.  Increase dose to 60 mcg weekly      #Hypophosphatemia due to CRRT, replete sodium phosphate as needed  Disposition - continue ICU monitoring  Claudia Desanctis, MD 10/18/2021 6:50 AM

## 2021-10-18 NOTE — Progress Notes (Signed)
Nutrition Follow-up  DOCUMENTATION CODES:   Not applicable  INTERVENTION:   Tube Feeding via Cortrak (post pyloric): Change to Vital 1.5 formula today given national shortage of Pivot 1.5 and only few bottles remaining in house Vital 1.5 at 60 ml/hr Pro-Source TF20 60 mL TID This provides 2400 kcals, 157 g of protein and 1094 mL of free water  Add Banatrol TF BID via tube- Provides 45kcal, 5g soluble fiber and 2g protein per serving.  Renal MVI daily  NUTRITION DIAGNOSIS:   Inadequate oral intake related to acute illness as evidenced by NPO status.  Being addressed via TF  GOAL:   Patient will meet greater than or equal to 90% of their needs  Met via TF  MONITOR:   Vent status, Labs, Weight trends, TF tolerance  REASON FOR ASSESSMENT:   Ventilator    ASSESSMENT:   61 yo male admitted with acute on chronic CHF with recent CABG, symptomatic bradycardia and hypotension. PMH includes DM, CAD s/p recent CABG x 5, CKD 3, CVA  8/19 Pericardial window, evacuation of pericardial and pleural effusion 8/20 Intubated, Bedside mediastinal re-exploration, cardiac tamponade, mediastinal clot; PEA arrest due to tamponade, VA ECMO cannulation 8/21 Return to OR for bleeding, mediastinal re-exploration, Cortrak placed-gastric 8/23 TEE with EF 25-30% 8/25 Cortrak advanced to Post-Pyloric position 8/28 OR: VA ECMO decannulation, Impella placed, mediastinal washout, CRRT initiated 8/29 Brief arrest 9/02 OR: mediastinal washout, sternal closure 9/05 Vomiting large volume, TF held, NG placed to LIS, watery, foul smelling diarrhea, KUB negative for ileus/obstruction, confirmed Cortrak is post pyloric 9/07 OR: Impella removed, Trach placed  Pt remains on vent support via trach Remains on CRRT, remains anuric  Tolerating Pivot 1.5 at 30 ml/hr via Cortrak-tip in descending duodenum per abd xray 9/7  No further vomiting, NG to LIS now with minimal, bilious output, no bleeding.   Noted  high dose Reglan has now been discontinued, stool output has improved. Noted mild bowel regimen started by MD today. Need to monitor output. Stool consistency still type 7. RD to order Banatrol as goal is to thicken stool to hopefully remove rectal tube  Wound VAC and midline JP drain  Labs: sodium wdl, potassium wdl, phosphorus wdl Meds: ss novolog, senokot    Diet Order:   Diet Order             Diet NPO time specified  Diet effective midnight                   EDUCATION NEEDS:   Not appropriate for education at this time  Skin:  Skin Assessment: Skin Integrity Issues: Skin Integrity Issues:: Stage II Stage II: sacrum Incisions: open chest Other: dehisced wound (previous surgical incision) to R medial pretibial  Last BM:  Rectal tube, volume improved off relan  Height:   Ht Readings from Last 1 Encounters:  09/29/21 5' 7"  (1.702 m)    Weight:   Wt Readings from Last 1 Encounters:  10/18/21 90 kg     BMI:  Body mass index is 31.08 kg/m.  Estimated Nutritional Needs:   Kcal:  2200-2400 kcals  Protein:  135-165 g  Fluid:  1.8 L    Kerman Passey MS, RDN, LDN, CNSC Registered Dietitian 3 Clinical Nutrition RD Pager and On-Call Pager Number Located in [place]

## 2021-10-19 ENCOUNTER — Encounter (HOSPITAL_COMMUNITY): Payer: Self-pay | Admitting: Surgery

## 2021-10-19 ENCOUNTER — Inpatient Hospital Stay (HOSPITAL_COMMUNITY): Payer: Self-pay

## 2021-10-19 LAB — CBC
HCT: 28.6 % — ABNORMAL LOW (ref 39.0–52.0)
Hemoglobin: 9.2 g/dL — ABNORMAL LOW (ref 13.0–17.0)
MCH: 30 pg (ref 26.0–34.0)
MCHC: 32.2 g/dL (ref 30.0–36.0)
MCV: 93.2 fL (ref 80.0–100.0)
Platelets: 533 10*3/uL — ABNORMAL HIGH (ref 150–400)
RBC: 3.07 MIL/uL — ABNORMAL LOW (ref 4.22–5.81)
RDW: 16.7 % — ABNORMAL HIGH (ref 11.5–15.5)
WBC: 20.1 10*3/uL — ABNORMAL HIGH (ref 4.0–10.5)
nRBC: 0.7 % — ABNORMAL HIGH (ref 0.0–0.2)

## 2021-10-19 LAB — GLUCOSE, CAPILLARY
Glucose-Capillary: 118 mg/dL — ABNORMAL HIGH (ref 70–99)
Glucose-Capillary: 207 mg/dL — ABNORMAL HIGH (ref 70–99)
Glucose-Capillary: 69 mg/dL — ABNORMAL LOW (ref 70–99)
Glucose-Capillary: 72 mg/dL (ref 70–99)
Glucose-Capillary: 152 mg/dL — ABNORMAL HIGH (ref 70–99)
Glucose-Capillary: 92 mg/dL (ref 70–99)
Glucose-Capillary: 83 mg/dL (ref 70–99)

## 2021-10-19 LAB — RENAL FUNCTION PANEL
Albumin: 2.4 g/dL — ABNORMAL LOW (ref 3.5–5.0)
Albumin: 2.3 g/dL — ABNORMAL LOW (ref 3.5–5.0)
Anion gap: 12 (ref 5–15)
Anion gap: 7 (ref 5–15)
BUN: 47 mg/dL — ABNORMAL HIGH (ref 8–23)
BUN: 47 mg/dL — ABNORMAL HIGH (ref 8–23)
CO2: 21 mmol/L — ABNORMAL LOW (ref 22–32)
CO2: 20 mmol/L — ABNORMAL LOW (ref 22–32)
Calcium: 8.5 mg/dL — ABNORMAL LOW (ref 8.9–10.3)
Calcium: 7.9 mg/dL — ABNORMAL LOW (ref 8.9–10.3)
Chloride: 100 mmol/L (ref 98–111)
Chloride: 108 mmol/L (ref 98–111)
Creatinine, Ser: 3.53 mg/dL — ABNORMAL HIGH (ref 0.61–1.24)
Creatinine, Ser: 3.48 mg/dL — ABNORMAL HIGH (ref 0.61–1.24)
GFR, Estimated: 19 mL/min — ABNORMAL LOW (ref >60)
GFR, Estimated: 19 mL/min — ABNORMAL LOW (ref >60)
Glucose, Bld: 196 mg/dL — ABNORMAL HIGH (ref 70–99)
Glucose, Bld: 97 mg/dL (ref 70–99)
Phosphorus: 3.2 mg/dL (ref 2.5–4.6)
Phosphorus: 3 mg/dL (ref 2.5–4.6)
Potassium: 4 mmol/L (ref 3.5–5.1)
Potassium: 4.2 mmol/L (ref 3.5–5.1)
Sodium: 133 mmol/L — ABNORMAL LOW (ref 135–145)
Sodium: 135 mmol/L (ref 135–145)

## 2021-10-19 LAB — LACTATE DEHYDROGENASE: LDH: 275 U/L — ABNORMAL HIGH (ref 98–192)

## 2021-10-19 LAB — COOXEMETRY PANEL
Carboxyhemoglobin: 0.8 % (ref 0.5–1.5)
Methemoglobin: <0.7 % (ref 0.0–1.5)
O2 Saturation: 70.9 %
Total hemoglobin: 9.3 g/dL — ABNORMAL LOW (ref 12.0–16.0)

## 2021-10-19 LAB — PROTIME-INR
INR: 1.3 — ABNORMAL HIGH (ref 0.8–1.2)
Prothrombin Time: 16.1 seconds — ABNORMAL HIGH (ref 11.4–15.2)

## 2021-10-19 LAB — MAGNESIUM: Magnesium: 2.6 mg/dL — ABNORMAL HIGH (ref 1.7–2.4)

## 2021-10-19 MED ORDER — OXYCODONE HCL 5 MG PO TABS
20.0000 mg | ORAL_TABLET | Freq: Four times a day (QID) | ORAL | Status: DC
  Administered 2021-10-19 – 2021-10-29 (×40): 20 mg
  Filled 2021-10-19 (×40): qty 4

## 2021-10-19 MED ORDER — NEPRO/CARBSTEADY PO LIQD
1000.0000 mL | ORAL | Status: DC
  Administered 2021-10-19: 1000 mL

## 2021-10-19 MED ORDER — ASPIRIN 81 MG PO CHEW
81.0000 mg | CHEWABLE_TABLET | Freq: Every day | ORAL | Status: DC
  Administered 2021-10-19 – 2021-11-22 (×32): 81 mg
  Filled 2021-10-19 (×33): qty 1

## 2021-10-19 MED ORDER — CAMPHOR-MENTHOL 0.5-0.5 % EX LOTN
TOPICAL_LOTION | CUTANEOUS | Status: DC | PRN
  Filled 2021-10-19 (×2): qty 222

## 2021-10-19 MED ORDER — PRISMASOL BGK 4/2.5 32-4-2.5 MEQ/L REPLACEMENT SOLN: Status: DC

## 2021-10-19 MED ORDER — DIPHENHYDRAMINE HCL 50 MG/ML IJ SOLN
25.0000 mg | Freq: Four times a day (QID) | INTRAMUSCULAR | Status: DC | PRN
  Administered 2021-10-19 – 2021-11-08 (×13): 25 mg via INTRAVENOUS
  Filled 2021-10-19 (×13): qty 1

## 2021-10-19 MED ORDER — VASOPRESSIN 20 UNITS/100 ML INFUSION FOR SHOCK
0.0000 [IU]/min | INTRAVENOUS | Status: DC
  Administered 2021-10-19: 0.03 [IU]/min via INTRAVENOUS
  Filled 2021-10-19 (×3): qty 100

## 2021-10-19 MED ORDER — HYDRALAZINE HCL 20 MG/ML IJ SOLN: 10.0000 mg | INTRAMUSCULAR | Status: DC | PRN

## 2021-10-19 MED ORDER — PRISMASOL BGK 4/2.5 32-4-2.5 MEQ/L EC SOLN: Status: DC

## 2021-10-19 MED ORDER — HEPARIN SODIUM (PORCINE) 1000 UNIT/ML DIALYSIS
1000.0000 [IU] | INTRAMUSCULAR | Status: DC | PRN
  Administered 2021-10-29: 3000 [IU] via INTRAVENOUS_CENTRAL
  Filled 2021-10-19: qty 6

## 2021-10-19 MED ORDER — ASPIRIN 81 MG PO TBEC
81.0000 mg | DELAYED_RELEASE_TABLET | Freq: Every day | ORAL | Status: DC
  Filled 2021-10-19: qty 1

## 2021-10-19 NOTE — Progress Notes (Signed)
Arthur notified of generalized rash (red, raised, splotchy, all over body) noted during bath. Awaiting further orders.

## 2021-10-19 NOTE — Procedures (Signed)
Central Venous Catheter Insertion Procedure Note  ROMELL WOLDEN  [id number]  12-Mar-1960  Date:10/19/21  Time:11:38 AM   Provider Performing:Ashlei Chinchilla Mamie Nick [name]   Procedure: Insertion of Non-tunneled Central Venous Catheter(36556)with US guidance (03833)    Indication(s) Hemodialysis  Consent Risks of the procedure as well as the alternatives and risks of each were explained to the patient and/or caregiver.  Consent for the procedure was obtained and is signed in the bedside chart  Anesthesia Topical only with 1% lidocaine   Timeout Verified patient identification, verified procedure, site/side was marked, verified correct patient position, special equipment/implants available, medications/allergies/relevant history reviewed, required imaging and test results available.  Sterile Technique Maximal sterile technique including full sterile barrier drape, hand hygiene, sterile gown, sterile gloves, mask, hair covering, sterile ultrasound probe cover (if used).  Procedure Description Area of catheter insertion was cleaned with chlorhexidine and draped in sterile fashion.   With real-time ultrasound guidance a HD catheter was placed into the right internal jugular vein.  Before dilation, [place] was used to confirm guidewire placement in the compressible vein. Nonpulsatile blood flow and easy flushing noted in all ports.  The catheter was sutured in place and sterile dressing applied.  Complications/Tolerance None; patient tolerated the procedure well. Chest X-ray is ordered to verify placement for internal jugular or subclavian cannulation.  Chest x-ray is not ordered for femoral cannulation.  EBL Minimal  Specimen(s) None  [name], DO 10/19/21 11:38 AM [facility]

## 2021-10-19 NOTE — Progress Notes (Signed)
Patient ID: [name], male   DOB: May 21, 1960, 61 y.o.   MRN: [id number]    Advanced Heart Failure Rounding Note   Subjective:    - 8/19 Pericardial window - 8/20 Cardiac arrest with tamponade -> Emergent bedside washout - 09/29/21 VA Cannulation - 09/30/21 Return to OR for mediastinal hemorrhage - 10/01/21 Developed AF -> amio - 10/02/21 TEE EF 25-30%  - 10/04/21 OR for washout. C/b continued bleeding overnight - 10/07/21 Placement of Impella 5.5 with washout, VA ECMO decannulation. Hypotensive with development of severe RV dysfunction after ECMO off and pressors titrated up. Multiple units of blood products. - 10/08/21 Brief PEA arrest. AFL with RVR >> S/p DCCV to SR, back in AFL shortly after - 8/31 Give 1UPRBCs  - 9/2 OR for chest closure - 9/3 Hypotensive overnight w/ SBPs in 80s. Febrile, mTemp 100.8. CRRT paused. VP increased to 0.04.  - 9/4 s/p bronchoscopy w/ BAL by PCCM, Cx NGTD  - 9/5 vomiting w/ large volume NGT output + watery/foul diarrhea, Tube feeds held. No signs of ileus on KUB. C-diff negative   - 9/7 OR for Impella Extraction and percutaneous tracheostomy. 2 u RBCs. - 9/9 CVVH off and HD catheter removed for line holiday.   Epinephrine restarted with hypotension, currently at 4 with SBP 150s. CO-OX 71%.  CVP higher on my read, up to 22 this morning off CVVH.   Remains on Vanc + meropenum. WBCs 20, cultures negative so far.    NSR on amio gtt at 30/hr   Hgb 7.8>8.8>9.4>9.2  Awake and following commands.   Echo 08/28: LV EF 55-60% with severe LVH, small ventricular cavity, RV appears to have near normal systolic function   Objective:     Vital Signs:   Temp:  [98.2 F (36.8 C)-99.7 F (37.6 C)] 98.8 F (37.1 C) (09/09 0800) Pulse Rate:  [69-86] 76 (09/09 0715) Resp:  [13-31] 22 (09/09 0715) BP: (80-127)/(23-102) 91/64 (09/09 0500) SpO2:  [93 %-100 %] 97 % (09/09 0715) Arterial Line BP: (90-197)/(35-67) 120/50 (09/09 0715) FiO2 (%):  [40 %] 40 % (09/09  0328) Weight:  [96.3 kg] 96.3 kg (09/09 0500) Last BM Date : 10/17/21  Weight change: Filed Weights   10/17/21 0500 10/18/21 0500 10/19/21 0500  Weight: 88.6 kg 90 kg 96.3 kg    Intake/Output:   Intake/Output Summary (Last 24 hours) at 10/19/2021 0840 Last data filed at 10/19/2021 0556 Gross per 24 hour  Intake 2060.11 ml  Output 860 ml  Net 1200.11 ml    PHYSICAL EXAM: General: NAD Neck: JVP 16+, no thyromegaly or thyroid nodule.  Lungs: Decreased at bases.  CV: Nondisplaced PMI.  Heart regular S1/S2, no S3/S4, no murmur.  1+ ankle edema.  Abdomen: Soft, nontender, no hepatosplenomegaly, no distention.  Skin: Intact without lesions or rashes.  Neurologic: Follows commands.  Extremities: No clubbing or cyanosis.  HEENT: Trach.    Telemetry: SR 70s (personally reviewed)   Labs: Basic Metabolic Panel: Recent Labs  Lab 10/15/21 0404 10/15/21 1503 10/16/21 0407 10/16/21 1600 10/17/21 0400 10/17/21 0828 10/17/21 0832 10/17/21 1744 10/18/21 0331 10/18/21 1555 10/19/21 0441  NA 138   < > 136   < > 135   < > 136 136 136 136 133*  K 4.4   < > 4.8   < > 4.1   < > 4.0 3.9 4.0 4.3 4.0  CL 101   < > 102   < > 101  --  101 104 103 102 100  CO2 24   < > 24   < > 24  --   --  _0 21*  GLUCOSE 180*   < > 90   < > 103*  --  110* 124* 152* 180* 196*  BUN 33*   < > 26*   < > 20  --  21 22 24* 29* 47*  CREATININE 1.88*   < > 1.53*   < > 1.73*  --  1.90* 1.70* 1.79* 2.44* 3.53*  CALCIUM 8.7*   < > 8.9   < > 8.5*  --   --  8.2* 7.8* 8.4* 8.5*  MG 2.7*  --  2.4  --  2.5*  --   --   --  2.3  --  2.6*  PHOS 3.1   < > 2.7   < > 2.9  --   --  3.0 2.8 2.7 3.2   < > = values in this interval not displayed.    Liver Function Tests: Recent Labs  Lab 10/17/21 0400 10/17/21 1744 10/18/21 0331 10/18/21 1555 10/19/21 0441  ALBUMIN 2.5* 2.5* 2.4* 2.5* 2.4*   No results for input(s): "LIPASE", "AMYLASE" in the last 168 hours. No results for input(s): "AMMONIA" in the last 168  hours.  CBC: Recent Labs  Lab 10/16/21 0407 10/17/21 0400 10/17/21 0828 10/17/21 0832 10/18/21 0331 10/18/21 1555 10/19/21 0441  WBC 16.3* 16.1*  --   --  16.9* 19.1* 20.1*  HGB 8.8* 7.8* 7.5* 8.8* 9.4* 9.1* 9.2*  HCT 27.5* 24.7* 22.0* 26.0* 28.7* 28.4* 28.6*  MCV 92.0 92.5  --   --  91.1 92.5 93.2  PLT 600* 587*  --   --  481* 528* 533*    Cardiac Enzymes: No results for input(s): "CKTOTAL", "CKMB", "CKMBINDEX", "TROPONINI" in the last 168 hours.  BNP: BNP (last 3 results) Recent Labs    12/24/20 0651 09/28/21 0405  BNP 412.0* 826.6*    ProBNP (last 3 results) No results for input(s): "PROBNP" in the last 8760 hours.    Other results:  Imaging: DG CHEST PORT 1 VIEW  Result Date: 10/17/2021 CLINICAL DATA:  Impella device removed.  New trach placed. EXAM: PORTABLE CHEST 1 VIEW COMPARISON:  10/17/2021 and earlier FINDINGS: Tracheostomy tube is in place, tip overlying the level of the proximal trachea. Feeding tube and nasogastric tube are in place, tip beyond the level of the image. LEFT IJ central line tip overlies the superior vena cava. RIGHT subclavian line has been removed. Impella device has been removed. Heart size is normal. There is patchy opacity in the LEFT lung base, similar to prior study. Cardiomegaly, stable. IMPRESSION: 1. Stable cardiomegaly. 2. Stable LEFT LOWER lobe atelectasis or infiltrate. 3. Tracheostomy tube in good position. Electronically Signed   By: Nolon Nations M.D.   On: 10/17/2021 10:54   DG Abd 1 View  Result Date: 10/17/2021 CLINICAL DATA:  Postop. Impella device removed. New tracheostomy placed. New nasogastric tube. EXAM: ABDOMEN - 1 VIEW COMPARISON:  10/15/2021 FINDINGS: Interval removal of Impella device. Nasogastric tube is in place, tip overlying the level of the stomach. Side port is below the expected location of the gastroesophageal junction. Feeding tube tip overlies the region of the descending duodenum. There is minimal LEFT  LOWER lobe atelectasis. IMPRESSION: Feeding tube and nasogastric tube as described. Electronically Signed   By: Nolon Nations M.D.   On: 10/17/2021 10:50     Medications:     Scheduled Medications:  sodium chloride  Intravenous Once   aspirin EC  81 mg Oral Daily   atorvastatin  80 mg Per Tube Daily   Chlorhexidine Gluconate Cloth  6 each Topical Daily   clonazePAM  1 mg Per Tube Q8H   [START ON 10/21/2021] darbepoetin (ARANESP) injection - NON-DIALYSIS  60 mcg Subcutaneous Q Mon-1800   feeding supplement (PROSource TF20)  60 mL Per Tube TID   fiber supplement (BANATROL TF)  60 mL Per Tube BID   heparin injection (subcutaneous)  5,000 Units Subcutaneous Q8H   insulin aspart  0-15 Units Subcutaneous Q4H   multivitamin  1 tablet Per Tube QHS   mouth rinse  15 mL Mouth Rinse Q2H   oxyCODONE  20 mg Per Tube Q6H   pantoprazole (PROTONIX) IV  40 mg Intravenous Q12H   QUEtiapine  50 mg Per Tube QHS   sennosides  5 mL Per Tube QHS   sodium chloride flush  10-40 mL Intracatheter Q12H   sodium chloride flush  3 mL Intravenous Q12H    Infusions:  sodium chloride Stopped (10/17/21 1812)   sodium chloride     albumin human Stopped (10/15/21 1901)   amiodarone 30 mg/hr (10/19/21 0556)   epinephrine 5 mcg/min (10/19/21 0811)   feeding supplement (NEPRO CARB STEADY) 1,000 mL (10/19/21 0756)   HYDROmorphone 1.5 mg/hr (10/19/21 0804)    PRN Medications: sodium chloride, albumin human, dextrose, hydrALAZINE, HYDROmorphone, ipratropium-albuterol, midazolam, ondansetron (ZOFRAN) IV, polyvinyl alcohol, sodium chloride flush, sodium chloride flush   Assessment/Plan:    1. Shock - mixed cardiogenic/hemorrhagic -> VA ECMO -> decannulated on 8/28 to Impella 5.5 - Echo 08/28: Underfilled LV, EF 55-60% with severe LVH and near normal RV systolic function.  - Impella extracted 9/7 - Currently on epinephrine at 4. CO-OX 71% this am with elevated BP.  Should be able to slowly wean epinephrine.  -  CVP higher at 22 this morning, HD catheter out for line holiday.  Will need to restart CVVH soon => will discuss replacing HD line today with CCM.    2. Cardiac arrest (PEA/bradycardic) - 8/19 in setting of tamponade - PEA arrest again on 08/28  3. Cardiac tamponade with emergent bedside sternotomy  - Diffuse epicardial bleeding with post-op Dresslers - return to OR 8/21 and 8/24 for washouts.  - Washout 8/28 in OR. Multiple units of blood products in OR.   - CT/MT minimal blood output.  - Chest closed on 9/2  4. Acute hypoxemic respiratory failure - Off ECMO.  - Vent support per CCM - Perc Trach placed 9/7  - remains on Vanc + meropenem. Discussed with CCM. Will plan for end date 09/11 - s/p bronchoscopy w/ BAL 9/4. Cx NGTD   5. AKI due to ATN - CRRT started 08/28.  - remains anuric. Volume up, CVP 22 - Line holiday currently => discuss replacing HD line with CCM today to resume CVVH.    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 Continues on colchicine.  7. 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 ASA held as above  8. DM2 - continue SSI + levemir  9. PAF/AFL - Tolerates poorly.  - Recurrent AFL. S/p DCCV  to SR 08/29.  - SR today.   - now off Epi, can wean amio to 30/hr  - Not on [facility] for now, DVT dose heparin  10. ID - On vancomycin/meropenem with recent open chest.  - Planning for end date 09/11  11. Neuro -off sedation, following  commands   12. FEN - TFs ongoing @ goal  13. Hyperkalemia - resolved with RRT.   14. GI  - vomiting w/ large volume NGT output + watery/foul diarrhea 9/5. KUB negative for ileus - C-diff negative  - now off Reglan, improving     CRITICAL CARE Performed by: Loralie Champagne  Total critical care time: 35 minutes  Critical care time was exclusive of separately billable procedures and treating other patients.  Critical care was necessary to treat or prevent imminent or life-threatening  deterioration.  Critical care was time spent personally by me (independent of midlevel providers or residents) on the following activities: development of treatment plan with patient and/or surrogate as well as nursing, discussions with consultants, evaluation of patient's response to treatment, examination of patient, obtaining history from patient or surrogate, ordering and performing treatments and interventions, ordering and review of laboratory studies, ordering and review of radiographic studies, pulse oximetry and re-evaluation of patient's condition.  Loralie Champagne, MD  8:40 AM

## 2021-10-19 NOTE — Progress Notes (Signed)
OT Cancellation Note  Patient Details Name: GERHART RUGGIERI MRN: [id number] DOB: 01/20/1961   Cancelled Treatment:    Reason Eval/Treat Not Completed: Medical issues which prohibited therapy.  Patient with increased agitation, RN will be trial chair position.  OT will check back and proceed as appropriate.    Josiane Labine D Fabiola Mudgett 10/19/2021, 9:33 AM 10/19/2021  RP, OTR/L  Acute Rehabilitation Services  Office:  [phone]

## 2021-10-19 NOTE — Progress Notes (Signed)
NAME:  [name], MRN:  [id number], DOB:  [birth year], LOS: 21 ADMISSION DATE:  09/28/2021, CONSULTATION DATE:  8/20 REFERRING MD:  Kipp Brood, CHIEF COMPLAINT:  dyspnea   History of Present Illness:  61 y/o male underwent CABG on 8/3, readmitted on 8/19 in setting of dyspnea with a pericardial effusion requiring pericardial window via bedside sternotomy.  Has had pleural effusions, developed PEA arrest on 8/20, briefly required [facility] ECMO which was discontinued on 8/29, impella placed, started on CRRT.  Hypoxemia, repeat cardiac arrest on 8/30.    Pertinent  Medical History  CAD s/p CABG 8/3 DM2 Diverticulosis GERD Hyperlipidemia Hypertension History of stroke Tobacco Abuse  Significant Hospital Events: Including procedures, antibiotic start and stop dates in addition to other pertinent events   8/19 admitted, s/p pericardial window 8/20 PEA cardiac arrest, left pigtail chest tube placement, PEA cardiac arrest due to tamponade, bedside sternotomy with pericardial drains placed. 8/21 return to OR for overnight bleeding.  8/22 minimal chest tube output.  Atrial fibrillation, controled with amiodarone.  8/22 TEE showed EF 35% at baseline, which improved significantly with decreasing ECMO flow.  8/23 tolerated diuresis  8/24 mediastinal washout, removal of hematoma 8/25 ongoing bleeding issues from posterior mediastinum 8/29 decannulated and off ECMO, 5.5 Impella placed, L femoral HD cath placed and started on CRRT 8/30 hypoxia and hypotension followed by brief arrest, gentle L lateral chest compressions performed and ROSC in roughly 1 minute. 8/31 remains deeply sedated on ventilator with open chest.  No acute events overnight 9/1 no acute events overnight, remains on ventilator deep sedation.  Tolerating aggressive volume removal per CRRT 9/2 sternotomy closure 9/3 wean versed 9/4 FOB, low grade temps, notable thick mucus/frequent suctioning 9/5 Vomiting, ?mucus plugging. PSV 5-6  hours on 10/5 9/6 impella down to p3 9/7 impella removal and trach. PRBC and DDAVP in OR  9/8 on low dose epi, weaned on 8/8  Interim History / Subjective:  On CRRT & line holiday currently. This morning he denies complaints. Having significant NGT output but doesn't look like TF.  Objective   Blood pressure 91/64, pulse 77, temperature 98.2 F (36.8 C), resp. rate 18, height 5\' 7"  (1.702 m), weight 96.3 kg, SpO2 97 %. CVP:  [9 mmHg-26 mmHg] 15 mmHg  Vent Mode: PRVC FiO2 (%):  [40 %] 40 % Set Rate:  [16 bmp] 16 bmp Vt Set:  [530 mL] 530 mL PEEP:  [8 cmH20] 8 cmH20 Pressure Support:  [8 cmH20] 8 cmH20 Plateau Pressure:  [19 cmH20-20 cmH20] 19 cmH20   Intake/Output Summary (Last 24 hours) at 10/19/2021 0722 Last data filed at 10/19/2021 0556 Gross per 24 hour  Intake 2159.2 ml  Output 1029 ml  Net 1130.2 ml    Filed Weights   10/17/21 0500 10/18/21 0500 10/19/21 0500  Weight: 88.6 kg 90 kg 96.3 kg    Examination:  General: ill appearing man lying in bed in NAD Neuro: awake, alert, follows commands but has short attention span. Globally weak.  HEENT: [place]/AT, conjunctival injection improved. NGT & cortrak in place.   CV:  S1S2, RRR PULM: rhonchi on the left, CTA on the right. Small amount of thick clear/ blood-tinged secretions when suctioned.  GI: soft, +bowel sounds Extremities: mild edema, no cyanosis Skin: warm, dry, no diffuse rashes.  Coox 71% Na+ 133 BUN 47 Cr 3.53 WBC 20.1 H/H 9.2/28.6 Platelets 533 INR 1.3  Resolved Hospital Problem list   Acute RV failure > resolved  Assessment & Plan:  Acute metabolic encephalopathy, improving  -limit sedation as able -RASS goal 0 -weaning dilaudid-- changing to fixed rate + boluses -oxycodone 20mg  6h -seroquel BID -clonazepam -delirium precautions -needs aggressive PT, OT -PT/OT, mobility. Bed in chair position today.  Hemorrhagic pericarditis with tamponade PEA arrest due to tamponade, s/p emergent  sternotomy Cardiogenic & hemorrhagic shock, s/p [facility] ECMO (decannulated 8/29), s/p Impella (removed 9/7) CAD s/p CABG x3 Afib  -off epi today; goal MAP >65 -serial coox -amiodarone -con't statin -adding back ASA 81mg  -completing antibiotics today  Acute respiratory failure with hypoxemia and hypercarbia S/p Tracheostomy  -LTVV -con't vent weaning efforts -routine trach care -VAP prevention protocol -still needs CPT & nebs; he gets unstable with mucus plugs -PAD protocol for sedation  AKI with anuria  -line and HD holiday; anticipate replacing line tomorrow and trial of iHD -renally dose meds, avoid nephrotoxic meds  ABLA, previous hemorrhagic shock Thrombocytosis  -transfuse for Hb <7 or hemodynamically significant bleeding  Vomiting Diarrhea, improving -con't NGT to LIWS to monitor for TF intolerance -con't TF via cortrak  DM II -SSI PRN -goal BG 140-180  At Risk Malnutrition  -TF  Lines/Tubes/Drains -sternal wound vac  -FMS (8/25)  -trach (9/7) -L Fem HD cath (8/28- 9/8) -LIJ CVC (8/20)  -L rad art line (8/19)   Best Practice (right click and "Reselect all SmartList Selections" daily)  Diet/type: tubefeeds  DVT prophylaxis: prophylactic heparin  GI prophylaxis: PPI Lines: Central line- trying to remove todya Foley:  N/A Code Status:  full code Last date of multidisciplinary goals of care discussion [per primary]  This patient is critically ill with multiple organ system failure which requires frequent high complexity decision making, assessment, support, evaluation, and titration of therapies. This was completed through the application of advanced monitoring technologies and extensive interpretation of multiple databases. During this encounter critical care time was devoted to patient care services described in this note for 45 minutes.  [name], DO 10/19/21 8:33 AM [facility]

## 2021-10-19 NOTE — Progress Notes (Signed)
2 Days Post-Op Procedure(s) (LRB): REMOVAL OF IMPELLA LEFT VENTRICULAR ASSIST DEVICE (Right) TRANSESOPHAGEAL ECHOCARDIOGRAM (TEE) (N/A) PERCUTANEOUS TRACHEOSTOMY USING SHILEY FLEXIBLE 8 mm CUFFED TRACH. (N/A) Subjective: Delirium persists, does follow commands  Objective: Vital signs in last 24 hours: Temp:  [98.2 F (36.8 C)-99.7 F (37.6 C)] 98.8 F (37.1 C) (09/09 0800) Pulse Rate:  [69-86] 85 (09/09 0945) Cardiac Rhythm: Normal sinus rhythm (09/09 0800) Resp:  [14-31] 27 (09/09 0945) BP: (80-141)/(23-121) 128/110 (09/09 0945) SpO2:  [93 %-100 %] 95 % (09/09 0945) Arterial Line BP: (101-197)/(35-67) 162/55 (09/09 0945) FiO2 (%):  [40 %] 40 % (09/09 0918) Weight:  [96.3 kg] 96.3 kg (09/09 0500)  Hemodynamic parameters for last 24 hours: CVP:  [9 mmHg-26 mmHg] 18 mmHg  Intake/Output from previous day: 09/08 0701 - 09/09 0700 In: 2316.1 [I.V.:823.8; NG/GT:1096.5; IV Piggyback:365.7] Out: 1029 [Emesis/NG output:950; Drains:10] Intake/Output this shift: No intake/output data recorded.  General appearance: delirious Neurologic: follows simple commands Heart: regular rate and rhythm Lungs: rhonchi bilaterally Abdomen: distended  Lab Results: Recent Labs    10/18/21 1555 10/19/21 0441  WBC 19.1* 20.1*  HGB 9.1* 9.2*  HCT 28.4* 28.6*  PLT 528* 533*   BMET:  Recent Labs    10/18/21 1555 10/19/21 0441  NA 136 133*  K 4.3 4.0  CL 102 100  CO2 23 21*  GLUCOSE 180* 196*  BUN 29* 47*  CREATININE 2.44* 3.53*  CALCIUM 8.4* 8.5*    PT/INR:  Recent Labs    10/19/21 0441  LABPROT 16.1*  INR 1.3*   ABG    Component Value Date/Time   PHART 7.375 10/17/2021 0828   HCO3 25.0 10/17/2021 0828   TCO2 23 10/17/2021 0832   ACIDBASEDEF 2.0 10/07/2021 1814   O2SAT 70.9 10/19/2021 0606   CBG (last 3)  Recent Labs    10/19/21 0316 10/19/21 0756 10/19/21 0758  GLUCAP 207* 69* 72    Assessment/Plan: S/P Procedure(s) (LRB): REMOVAL OF IMPELLA LEFT VENTRICULAR  ASSIST DEVICE (Right) TRANSESOPHAGEAL ECHOCARDIOGRAM (TEE) (N/A) PERCUTANEOUS TRACHEOSTOMY USING SHILEY FLEXIBLE 8 mm CUFFED TRACH. (N/A) - NEURO- delirium persists, has improved a little CV- in Sr, co-ox 71 on low dose epi RESP- trach in place, on vent overnight  Plan per CCM RENAL- ARF, creatinine up, has ben off CVVHD since yesterday  Will need new line to restart CVVH GI- on TF ENDO- CBG low this AM ID- vanco and meropenem  WBC remains elevated, monitor, culture if spikes Deconditioning- PT/OT   LOS: 21 days    [place] Nakayama 10/19/2021

## 2021-10-19 NOTE — Plan of Care (Signed)
Problem: Education: Goal: Will demonstrate proper wound care and an understanding of methods to prevent future damage Outcome: Progressing Goal: Knowledge of disease or condition will improve Outcome: Progressing Goal: Knowledge of the prescribed therapeutic regimen will improve Outcome: Progressing Goal: Individualized Educational Video(s) Outcome: Progressing   Problem: Activity: Goal: Risk for activity intolerance will decrease Outcome: Progressing   Problem: Cardiac: Goal: Will achieve and/or maintain hemodynamic stability Outcome: Progressing   Problem: Clinical Measurements: Goal: Postoperative complications will be avoided or minimized Outcome: Progressing   Problem: Respiratory: Goal: Respiratory status will improve Outcome: Progressing   Problem: Skin Integrity: Goal: Wound healing without signs and symptoms of infection Outcome: Progressing Goal: Risk for impaired skin integrity will decrease Outcome: Progressing   Problem: Urinary Elimination: Goal: Ability to achieve and maintain adequate renal perfusion and functioning will improve Outcome: Progressing   Problem: Education: Goal: Understanding of cardiac disease, CV risk reduction, and recovery process will improve Outcome: Progressing Goal: Individualized Educational Video(s) Outcome: Progressing   Problem: Activity: Goal: Ability to tolerate increased activity will improve Outcome: Progressing   Problem: Cardiac: Goal: Ability to achieve and maintain adequate cardiovascular perfusion will improve Outcome: Progressing   Problem: Health Behavior/Discharge Planning: Goal: Ability to safely manage health-related needs after discharge will improve Outcome: Progressing   Problem: Education: Goal: Knowledge of General Education information will improve Description: Including pain rating scale, medication(s)/side effects and non-pharmacologic comfort measures Outcome: Progressing   Problem: Health  Behavior/Discharge Planning: Goal: Ability to manage health-related needs will improve Outcome: Progressing   Problem: Clinical Measurements: Goal: Ability to maintain clinical measurements within normal limits will improve Outcome: Progressing Goal: Will remain free from infection Outcome: Progressing Goal: Diagnostic test results will improve Outcome: Progressing Goal: Respiratory complications will improve Outcome: Progressing Goal: Cardiovascular complication will be avoided Outcome: Progressing   Problem: Activity: Goal: Risk for activity intolerance will decrease Outcome: Progressing   Problem: Nutrition: Goal: Adequate nutrition will be maintained Outcome: Progressing   Problem: Coping: Goal: Level of anxiety will decrease Outcome: Progressing   Problem: Elimination: Goal: Will not experience complications related to bowel motility Outcome: Progressing Goal: Will not experience complications related to urinary retention Outcome: Progressing   Problem: Pain Managment: Goal: General experience of comfort will improve Outcome: Progressing   Problem: Safety: Goal: Ability to remain free from injury will improve Outcome: Progressing   Problem: Skin Integrity: Goal: Risk for impaired skin integrity will decrease Outcome: Progressing   Problem: Education: Goal: Ability to describe self-care measures that may prevent or decrease complications (Diabetes Survival Skills Education) will improve Outcome: Progressing Goal: Individualized Educational Video(s) Outcome: Progressing   Problem: Coping: Goal: Ability to adjust to condition or change in health will improve Outcome: Progressing   Problem: Fluid Volume: Goal: Ability to maintain a balanced intake and output will improve Outcome: Progressing   Problem: Health Behavior/Discharge Planning: Goal: Ability to identify and utilize available resources and services will improve Outcome: Progressing Goal:  Ability to manage health-related needs will improve Outcome: Progressing   Problem: Metabolic: Goal: Ability to maintain appropriate glucose levels will improve Outcome: Progressing   Problem: Nutritional: Goal: Maintenance of adequate nutrition will improve Outcome: Progressing Goal: Progress toward achieving an optimal weight will improve Outcome: Progressing   Problem: Skin Integrity: Goal: Risk for impaired skin integrity will decrease Outcome: Progressing   Problem: Tissue Perfusion: Goal: Adequacy of tissue perfusion will improve Outcome: Progressing   Problem: Education: Goal: Knowledge about tracheostomy care/management will improve Outcome: Progressing   Problem: Activity: Goal:  Ability to tolerate increased activity will improve Outcome: Progressing

## 2021-10-19 NOTE — Progress Notes (Signed)
      [address]       [place] [address]             [phone]      Still intermittent agitation  BP 96/84   Pulse 80   Temp 98.4 F (36.9 C) (Axillary)   Resp 17   Ht 5\' 7"  (1.702 m)   Wt 96.3 kg Comment: new bed  SpO2 99%   BMI 33.25 kg/m   Intake/Output Summary (Last 24 hours) at 10/19/2021 1620 Last data filed at 10/19/2021 1600 Gross per 24 hour  Intake 2344.84 ml  Output 1255 ml  Net 1089.84 ml   Back on CVVH  Xolani Degracia C. Roxan Hockey, MD [facility] [phone]

## 2021-10-19 NOTE — Progress Notes (Addendum)
[facility] Progress Note  Name: [name] MRN: [id number] DOB: 08/23/1960   Subjective:  He had no UOP over 9/8.  CRRT clotted 9/8 am and was not resumed.  Less than 100 ml UF over 9/8 prior to that.  Has had clotting issues but also concern for bleed.  I asked team if line could be removed from his groin and CCM hoping for a short line holiday  Review of systems:    Unable to obtain 2/2 intubated and sedated    Intake/Output Summary (Last 24 hours) at 10/19/2021 0655 Last data filed at 10/19/2021 0556 Gross per 24 hour  Intake 2376.05 ml  Output 1097 ml  Net 1279.05 ml    Vitals:  Vitals:   10/19/21 0530 10/19/21 0545 10/19/21 0600 10/19/21 0615  BP:      Pulse: 80 80 80 81  Resp: (!) 22 (!) 21 (!) 23 (!) 22  Temp:      TempSrc:      SpO2: 100% 97% 97% 98%  Weight:      Height:         Physical Exam:        General adult male in bed critically ill  HEENT normocephalic atraumatic  Neck supple trachea midline Lungs coarse mechanical breath sounds; FIO2 40 and PEEP 8 Heart S1S2 Abdomen soft nontender nondistended Extremities 1-2+ edema lower extremities  Neuro - sedation currently running  Access: left femoral catheter has been removed; dressing in place clean   Medications reviewed   Labs:     Latest Ref Rng & Units 10/19/2021    4:41 AM 10/18/2021    3:55 PM 10/18/2021    3:31 AM  BMP  Glucose 70 - 99 mg/dL 196  180  152   BUN 8 - 23 mg/dL 47  29  24   Creatinine 0.61 - 1.24 mg/dL 3.53  2.44  1.79   Sodium 135 - 145 mmol/L 133  136  136   Potassium 3.5 - 5.1 mmol/L 4.0  4.3  4.0   Chloride 98 - 111 mmol/L 100  102  103   CO2 22 - 32 mmol/L 21  23  23    Calcium 8.9 - 10.3 mg/dL 8.5  8.4  7.8      Assessment/Plan:   Pt is a 61 y.o. yo male  with history of HTN, HLD, DM, A-fib, stroke, CKD, CAD multivessel dz status post CABG on 8/3 presented with shortness of breath, found to have cardiac tamponade, course complicated by cardiac arrest, use  of ECMO, seen as a consultation for the evaluation of AKI and fluid volume management.   #Acute kidney injury on CKD IIIa, nonoliguric: Ischemic ATN due to cardiogenic shock/cardiac arrest and multiple hemodynamic changes.  Started CRRT on 8/28 for decreasing urine output and fluid/volume management - Plans are to remove his femoral catheter and replace with an IJ after a short holiday   - clotting and previously not on anticoagulation with CRRT and note concern for bleeding as above - will switch to nepro as we are on a CRRT holiday   - anticipate line placement 9/10; assess dialysis needs daily.  Hopeful for transition to iHD soon    #Cardiac tamponade/hemorrhagic pericarditis status post pericardial drain placement with subsequent ECMO placement.  decannulated  with placement of Impella. On colchicine per cardiology S/p closure of the sternum and mediastinal washout on 9/2.   #Cardiogenic shock/cardiac arrest: Was on ECMO before. Continue pressors per primary team.  Impella out on 9/7    #Acute hypoxic respiratory failure: Currently intubated and sedated.  Vent per pulmonary.  We have been optimizing volume with CRRT and he is on a brief line holiday   #Acute blood loss anemia: Monitor hemoglobin and transfuse as needed by ICU team.  Aranesp 40 mcg weekly on Mondays started on 9/4.  Increased dose to 60 mcg weekly      #Hypophosphatemia due to CRRT, replete sodium phosphate as needed  Disposition - continue ICU monitoring  Claudia Desanctis, MD 10/19/2021 7:15 AM   Spoke with CCM.  They are getting a nontunneled catheter back in today - an IJ.  CHF concerned re: worsening overload   Restart CRRT - 4K fluids.  UF goal net negative 50 ml/hr as tolerated.  Please page nephrology for UF goal changes   Claudia Desanctis, MD 8:55 AM 10/19/2021

## 2021-10-19 NOTE — Progress Notes (Signed)
[facility] Progress Note Patient Name: [name] DOB: 01/02/1961 MRN: [id number]   Date of Service  10/19/2021  HPI/Events of Note  Bedside RN noted to have raised red rash which is new.  No longer on antibioitcs.    eICU Interventions  Placed order for PRN diphenhydramine. Will continue to monitor closely.          [name] M DELA CRUZ 10/19/2021, 10:25 PM

## 2021-10-20 DIAGNOSIS — R21 Rash and other nonspecific skin eruption: Secondary | ICD-10-CM

## 2021-10-20 LAB — RENAL FUNCTION PANEL
Albumin: 2.4 g/dL — ABNORMAL LOW (ref 3.5–5.0)
Albumin: 2.5 g/dL — ABNORMAL LOW (ref 3.5–5.0)
Anion gap: 13 (ref 5–15)
Anion gap: 9 (ref 5–15)
BUN: 33 mg/dL — ABNORMAL HIGH (ref 8–23)
BUN: 28 mg/dL — ABNORMAL HIGH (ref 8–23)
CO2: 21 mmol/L — ABNORMAL LOW (ref 22–32)
CO2: 23 mmol/L (ref 22–32)
Calcium: 8.4 mg/dL — ABNORMAL LOW (ref 8.9–10.3)
Calcium: 8.5 mg/dL — ABNORMAL LOW (ref 8.9–10.3)
Chloride: 101 mmol/L (ref 98–111)
Chloride: 105 mmol/L (ref 98–111)
Creatinine, Ser: 2.55 mg/dL — ABNORMAL HIGH (ref 0.61–1.24)
Creatinine, Ser: 1.86 mg/dL — ABNORMAL HIGH (ref 0.61–1.24)
GFR, Estimated: 28 mL/min — ABNORMAL LOW (ref >60)
GFR, Estimated: 41 mL/min — ABNORMAL LOW (ref >60)
Glucose, Bld: 135 mg/dL — ABNORMAL HIGH (ref 70–99)
Glucose, Bld: 179 mg/dL — ABNORMAL HIGH (ref 70–99)
Phosphorus: 2.7 mg/dL (ref 2.5–4.6)
Phosphorus: 2.6 mg/dL (ref 2.5–4.6)
Potassium: 4 mmol/L (ref 3.5–5.1)
Potassium: 4.1 mmol/L (ref 3.5–5.1)
Sodium: 135 mmol/L (ref 135–145)
Sodium: 137 mmol/L (ref 135–145)

## 2021-10-20 LAB — CBC
HCT: 27.4 % — ABNORMAL LOW (ref 39.0–52.0)
Hemoglobin: 8.7 g/dL — ABNORMAL LOW (ref 13.0–17.0)
MCH: 29.3 pg (ref 26.0–34.0)
MCHC: 31.8 g/dL (ref 30.0–36.0)
MCV: 92.3 fL (ref 80.0–100.0)
Platelets: 502 10*3/uL — ABNORMAL HIGH (ref 150–400)
RBC: 2.97 MIL/uL — ABNORMAL LOW (ref 4.22–5.81)
RDW: 16.4 % — ABNORMAL HIGH (ref 11.5–15.5)
WBC: 19.9 10*3/uL — ABNORMAL HIGH (ref 4.0–10.5)
nRBC: 0.3 % — ABNORMAL HIGH (ref 0.0–0.2)

## 2021-10-20 LAB — LACTATE DEHYDROGENASE: LDH: 320 U/L — ABNORMAL HIGH (ref 98–192)

## 2021-10-20 LAB — GLUCOSE, CAPILLARY
Glucose-Capillary: 128 mg/dL — ABNORMAL HIGH (ref 70–99)
Glucose-Capillary: 128 mg/dL — ABNORMAL HIGH (ref 70–99)
Glucose-Capillary: 149 mg/dL — ABNORMAL HIGH (ref 70–99)
Glucose-Capillary: 152 mg/dL — ABNORMAL HIGH (ref 70–99)
Glucose-Capillary: 164 mg/dL — ABNORMAL HIGH (ref 70–99)
Glucose-Capillary: 166 mg/dL — ABNORMAL HIGH (ref 70–99)
Glucose-Capillary: 98 mg/dL (ref 70–99)

## 2021-10-20 LAB — PROTIME-INR
INR: 1.3 — ABNORMAL HIGH (ref 0.8–1.2)
Prothrombin Time: 15.9 seconds — ABNORMAL HIGH (ref 11.4–15.2)

## 2021-10-20 LAB — MAGNESIUM: Magnesium: 2.5 mg/dL — ABNORMAL HIGH (ref 1.7–2.4)

## 2021-10-20 LAB — COOXEMETRY PANEL
Carboxyhemoglobin: 1.6 % — ABNORMAL HIGH (ref 0.5–1.5)
Methemoglobin: 0.8 % (ref 0.0–1.5)
O2 Saturation: 74.2 %
Total hemoglobin: 9.2 g/dL — ABNORMAL LOW (ref 12.0–16.0)

## 2021-10-20 MED ORDER — NEPRO/CARBSTEADY PO LIQD
1000.0000 mL | ORAL | Status: DC
  Administered 2021-10-20: 1000 mL
  Filled 2021-10-20: qty 1000

## 2021-10-20 MED ORDER — MIDODRINE HCL 5 MG PO TABS
5.0000 mg | ORAL_TABLET | Freq: Three times a day (TID) | ORAL | Status: DC
  Administered 2021-10-20 (×2): 5 mg
  Filled 2021-10-20 (×2): qty 1

## 2021-10-20 MED ORDER — HYDROXYZINE HCL 50 MG/ML IM SOLN
25.0000 mg | Freq: Four times a day (QID) | INTRAMUSCULAR | Status: AC
  Administered 2021-10-20 – 2021-10-21 (×4): 25 mg via INTRAMUSCULAR
  Filled 2021-10-20 (×4): qty 0.5

## 2021-10-20 MED ORDER — QUETIAPINE FUMARATE 50 MG PO TABS
50.0000 mg | ORAL_TABLET | Freq: Two times a day (BID) | ORAL | Status: DC
  Administered 2021-10-20 – 2021-10-28 (×18): 50 mg
  Filled 2021-10-20 (×18): qty 1

## 2021-10-20 NOTE — Progress Notes (Signed)
NAME:  [name], MRN:  [id number], DOB:  1960/06/27, LOS: 20 ADMISSION DATE:  09/28/2021, CONSULTATION DATE:  8/20 REFERRING MD:  Kipp Brood, CHIEF COMPLAINT:  dyspnea   History of Present Illness:  61 y/o male underwent CABG on 8/3, readmitted on 8/19 in setting of dyspnea with a pericardial effusion requiring pericardial window via bedside sternotomy.  Has had pleural effusions, developed PEA arrest on 8/20, briefly required VA ECMO which was discontinued on 8/29, impella placed, started on CRRT.  Hypoxemia, repeat cardiac arrest on 8/30.    Pertinent  Medical History  CAD s/p CABG 8/3 DM2 Diverticulosis GERD Hyperlipidemia Hypertension History of stroke Tobacco Abuse  Significant Hospital Events: Including procedures, antibiotic start and stop dates in addition to other pertinent events   8/19 admitted, s/p pericardial window 8/20 PEA cardiac arrest, left pigtail chest tube placement, PEA cardiac arrest due to tamponade, bedside sternotomy with pericardial drains placed. 8/21 return to OR for overnight bleeding.  8/22 minimal chest tube output.  Atrial fibrillation, controled with amiodarone.  8/22 TEE showed EF 35% at baseline, which improved significantly with decreasing ECMO flow.  8/23 tolerated diuresis  8/24 mediastinal washout, removal of hematoma 8/25 ongoing bleeding issues from posterior mediastinum 8/29 decannulated and off ECMO, 5.5 Impella placed, L femoral HD cath placed and started on CRRT 8/30 hypoxia and hypotension followed by brief arrest, gentle L lateral chest compressions performed and ROSC in roughly 1 minute. 8/31 remains deeply sedated on ventilator with open chest.  No acute events overnight 9/1 no acute events overnight, remains on ventilator deep sedation.  Tolerating aggressive volume removal per CRRT 9/2 sternotomy closure 9/3 wean versed 9/4 FOB, low grade temps, notable thick mucus/frequent suctioning 9/5 Vomiting, ?mucus plugging. PSV 5-6  hours on 10/5 9/6 impella down to p3 9/7 impella removal and trach. PRBC and DDAVP in OR  9/8 on low dose epi, weaned on 8/8 9/9 RIJ HD catheter placed  Interim History / Subjective:  Rash broke out on upper chest overnight. Moving around frequently, requiring more sedation to keep him calm  Objective   Blood pressure 96/84, pulse 70, temperature 97.8 F (36.6 C), temperature source Oral, resp. rate 15, height 5\' 7"  (1.702 m), weight 93.1 kg, SpO2 100 %. CVP:  [12 mmHg-40 mmHg] 16 mmHg  Vent Mode: PRVC FiO2 (%):  [40 %] 40 % Set Rate:  [16 bmp] 16 bmp Vt Set:  [530 mL] 530 mL PEEP:  [8 cmH20] 8 cmH20   Intake/Output Summary (Last 24 hours) at 10/20/2021 0751 Last data filed at 10/20/2021 0700 Gross per 24 hour  Intake 1835.75 ml  Output 3464 ml  Net -1628.25 ml    Filed Weights   10/18/21 0500 10/19/21 0500 10/20/21 0500  Weight: 90 kg 96.3 kg 93.1 kg    Examination:  General: ill appearing man lying in bed in NAD Neuro: arouses to verbal stimulation, not moving as much today, but still moving both arms.  HEENT: [place]/AT, eyes anicteric CV:  S1S2, RRR PULM: CTAB, breathing comfortably on MV GI: soft, NT Extremities: Minimal edema, no cyanosis Skin: Warm, dry. Mild rash on upper trunk- maculopapular.   Coox 74% Na+ 135 BUN 33 Cr 2.55 WBC 19.9 H/H 8.7/27.4 Platelets 502 INR 1.3  Resolved Hospital Problem list   Acute RV failure > resolved  Assessment & Plan:   Acute metabolic encephalopathy, improving  -dilaudid gtt + oxycodone -versed PRN -increase seroquel to BID -goal RASS 0 -PT, OT -delirium precautions -PT, OT, maximize  mobility as able  Hemorrhagic pericarditis with tamponade PEA arrest due to tamponade, s/p emergent sternotomy Cardiogenic & hemorrhagic shock, s/p VA ECMO (decannulated 8/29), s/p Impella (removed 9/7) CAD s/p CABG x3 Afib  -epi PRN for goal MAP >65 -serial cooximetry -con't amiodarone -statin, ASA  Acute respiratory failure  with hypoxemia and hypercarbia S/p Tracheostomy  -LTVV -VAP prevention protocol -PAD protocol for sedation -vent weaning  -routine trach care; sutures can come out on 9/14 -still needs CPT & nebs; he gets unstable with mucus plugs  AKI with anuria  -con't CRRT -renally dose meds, avoid nephrotoxic meds -strict I/Os  ABLA, previous hemorrhagic shock Thrombocytosis  -transfuse for Hb <7 or hemodynamically significant bleeding  Maculopapular rash- only new thing is banatrol. No history of food allergies. -stop banatrol -monitor for other med reactions -hydroxyzine q6h today -benadryl PRN  Vomiting Diarrhea, improving -NGT to LIWS -TF via cortak  DM II -SSI on hold due to low BG overnight; monitor today -goal BG 140-180  At Risk Malnutrition  -con't TF via cortrak  Lines/Tubes/Drains -sternal wound vac  -FMS (8/25)  -trach (9/7) -L Fem HD cath (8/28- 9/8) -LIJ CVC (8/20)  -L rad art line (8/19)  -RIJ HD catheter (9/9>)  Best Practice (right click and "Reselect all SmartList Selections" daily)  Diet/type: tubefeeds  DVT prophylaxis: prophylactic heparin  GI prophylaxis: PPI Lines: Central line and Dialysis Catheter Foley:  N/A Code Status:  full code Last date of multidisciplinary goals of care discussion [per primary]  This patient is critically ill with multiple organ system failure which requires frequent high complexity decision making, assessment, support, evaluation, and titration of therapies. This was completed through the application of advanced monitoring technologies and extensive interpretation of multiple databases. During this encounter critical care time was devoted to patient care services described in this note for 38 minutes.  [name], DO 10/20/21 8:22 AM [facility]

## 2021-10-20 NOTE — Progress Notes (Signed)
Patient ID: [name], male   DOB: 11/14/1960, 61 y.o.   MRN: [id number]   Advanced Heart Failure Rounding Note   Subjective:    - 8/19 Pericardial window - 8/20 Cardiac arrest with tamponade -> Emergent bedside washout - 09/29/21 VA Cannulation - 09/30/21 Return to OR for mediastinal hemorrhage - 10/01/21 Developed AF -> amio - 10/02/21 TEE EF 25-30%  - 10/04/21 OR for washout. C/b continued bleeding overnight - 10/07/21 Placement of Impella 5.5 with washout, VA ECMO decannulation. Hypotensive with development of severe RV dysfunction after ECMO off and pressors titrated up. Multiple units of blood products. - 10/08/21 Brief PEA arrest. AFL with RVR >> S/p DCCV to SR, back in AFL shortly after - 8/31 Give 1UPRBCs  - 9/2 OR for chest closure - 9/3 Hypotensive overnight w/ SBPs in 80s. Febrile, mTemp 100.8. CRRT paused. VP increased to 0.04.  - 9/4 s/p bronchoscopy w/ BAL by PCCM, Cx NGTD  - 9/5 vomiting w/ large volume NGT output + watery/foul diarrhea, Tube feeds held. No signs of ileus on KUB. C-diff negative   - 9/7 OR for Impella Extraction and percutaneous tracheostomy. 2 u RBCs. - 9/8 CVVH stopped and line removed for holiday - 9/9 CVVH restarted.   He remains on epinephrine at 6, BP up and down with sedation.   CVP 12, getting CVVH net UF 50 cc/hr.  Weight down.     NSR on amio gtt at 30/hr   Hgb 7.8>8.8>9.4>9.2>8.7  Will wake up and follow commands.    Echo 08/28: LV EF 55-60% with severe LVH, small ventricular cavity, RV appears to have near normal systolic function   Objective:     Vital Signs:   Temp:  [96.4 F (35.8 C)-98.8 F (37.1 C)] 97.8 F (36.6 C) (09/10 0749) Pulse Rate:  [67-85] 71 (09/10 0800) Resp:  [9-27] 11 (09/10 0800) BP: (96-141)/(48-121) 96/84 (09/09 1000) SpO2:  [95 %-100 %] 100 % (09/10 0800) Arterial Line BP: (82-213)/(40-105) 118/51 (09/10 0800) FiO2 (%):  [40 %] 40 % (09/10 0415) Weight:  [93.1 kg] 93.1 kg (09/10 0500) Last BM Date :  10/19/21  Weight change: Filed Weights   10/18/21 0500 10/19/21 0500 10/20/21 0500  Weight: 90 kg 96.3 kg 93.1 kg    Intake/Output:   Intake/Output Summary (Last 24 hours) at 10/20/2021 0828 Last data filed at 10/20/2021 0800 Gross per 24 hour  Intake 1735.93 ml  Output 3530 ml  Net -1794.07 ml    PHYSICAL EXAM: General: sedated on vent Neck: JVP 12 cm, no thyromegaly or thyroid nodule.  Lungs: Decreased at bases.  CV: Nondisplaced PMI.  Heart regular S1/S2, no S3/S4, no murmur.  No peripheral edema.    Abdomen: Soft, nontender, no hepatosplenomegaly, no distention.  Skin: Intact without lesions or rashes.  Neurologic: Sedated Extremities: No clubbing or cyanosis.  HEENT: Normal.    Telemetry: SR 70s (personally reviewed)   Labs: Basic Metabolic Panel: Recent Labs  Lab 10/16/21 0407 10/16/21 1600 10/17/21 0400 10/17/21 0828 10/18/21 0331 10/18/21 1555 10/19/21 0441 10/19/21 1610 10/20/21 0454  NA 136   < > 135   < > 136 136 133* 135 135  K 4.8   < > 4.1   < > 4.0 4.3 4.0 4.2 4.0  CL 102   < > 101   < > 103 102 100 108 101  CO2 24   < > 24   < > 23 23 21* 20* 21*  GLUCOSE 90   < >  103*   < > 152* 180* 196* 97 135*  BUN 26*   < > 20   < > 24* 29* 47* 47* 33*  CREATININE 1.53*   < > 1.73*   < > 1.79* 2.44* 3.53* 3.48* 2.55*  CALCIUM 8.9   < > 8.5*   < > 7.8* 8.4* 8.5* 7.9* 8.4*  MG 2.4  --  2.5*  --  2.3  --  2.6*  --  2.5*  PHOS 2.7   < > 2.9   < > 2.8 2.7 3.2 3.0 2.7   < > = values in this interval not displayed.    Liver Function Tests: Recent Labs  Lab 10/18/21 0331 10/18/21 1555 10/19/21 0441 10/19/21 1610 10/20/21 0454  ALBUMIN 2.4* 2.5* 2.4* 2.3* 2.4*   No results for input(s): "LIPASE", "AMYLASE" in the last 168 hours. No results for input(s): "AMMONIA" in the last 168 hours.  CBC: Recent Labs  Lab 10/17/21 0400 10/17/21 0828 10/17/21 0832 10/18/21 0331 10/18/21 1555 10/19/21 0441 10/20/21 0454  WBC 16.1*  --   --  16.9* 19.1* 20.1*  19.9*  HGB 7.8*   < > 8.8* 9.4* 9.1* 9.2* 8.7*  HCT 24.7*   < > 26.0* 28.7* 28.4* 28.6* 27.4*  MCV 92.5  --   --  91.1 92.5 93.2 92.3  PLT 587*  --   --  481* 528* 533* 502*   < > = values in this interval not displayed.    Cardiac Enzymes: No results for input(s): "CKTOTAL", "CKMB", "CKMBINDEX", "TROPONINI" in the last 168 hours.  BNP: BNP (last 3 results) Recent Labs    12/24/20 0651 09/28/21 0405  BNP 412.0* 826.6*    ProBNP (last 3 results) No results for input(s): "PROBNP" in the last 8760 hours.    Other results:  Imaging: DG CHEST PORT 1 VIEW  Result Date: 10/19/2021 CLINICAL DATA:  Central venous catheter in place EXAM: PORTABLE CHEST 1 VIEW COMPARISON:  Chest x-rays dated 10/17/2021. FINDINGS: Tracheostomy tube is stable in position with tip in the midline. LEFT IJ central line is stable in position with tip at the level of the upper SVC. RIGHT IJ Cordis in place with tip above the level of the SVC. Enteric tube passes below the diaphragm. Heart size and mediastinal contours are grossly stable. Persistent opacity at the LEFT lung base. RIGHT lung is clear. No pneumothorax is seen. IMPRESSION: 1. Support apparatus appears appropriately positioned, as detailed above. 2. Persistent opacity at the LEFT lung base, likely atelectasis and/or small pleural effusion. No new lung findings. No pneumothorax. Electronically Signed   By: Franki Cabot M.D.   On: 10/19/2021 12:24     Medications:     Scheduled Medications:  sodium chloride   Intravenous Once   aspirin  81 mg Per Tube Daily   atorvastatin  80 mg Per Tube Daily   Chlorhexidine Gluconate Cloth  6 each Topical Daily   clonazePAM  1 mg Per Tube Q8H   [START ON 10/21/2021] darbepoetin (ARANESP) injection - NON-DIALYSIS  60 mcg Subcutaneous Q Mon-1800   feeding supplement (PROSource TF20)  60 mL Per Tube TID   heparin injection (subcutaneous)  5,000 Units Subcutaneous Q8H   hydrOXYzine  25 mg Intramuscular Q6H    multivitamin  1 tablet Per Tube QHS   mouth rinse  15 mL Mouth Rinse Q2H   oxyCODONE  20 mg Per Tube Q6H   pantoprazole (PROTONIX) IV  40 mg Intravenous Q12H   QUEtiapine  50 mg Per Tube BID   sennosides  5 mL Per Tube QHS   sodium chloride flush  10-40 mL Intracatheter Q12H   sodium chloride flush  3 mL Intravenous Q12H    Infusions:   prismasol BGK 4/2.5 500 mL/hr at 10/20/21 0100    prismasol BGK 4/2.5 400 mL/hr at 10/20/21 0157   sodium chloride Stopped (10/17/21 1812)   sodium chloride     albumin human Stopped (10/15/21 1901)   amiodarone 30 mg/hr (10/20/21 0800)   epinephrine 6 mcg/min (10/20/21 0800)   feeding supplement (NEPRO CARB STEADY)     HYDROmorphone 1.5 mg/hr (10/20/21 0800)   prismasol BGK 4/2.5 1,800 mL/hr at 10/20/21 0616   vasopressin Stopped (10/19/21 1825)    PRN Medications: sodium chloride, albumin human, camphor-menthol, dextrose, diphenhydrAMINE, heparin, hydrALAZINE, HYDROmorphone, ipratropium-albuterol, midazolam, ondansetron (ZOFRAN) IV, polyvinyl alcohol, sodium chloride flush, sodium chloride flush   Assessment/Plan:    1. Shock - mixed cardiogenic/hemorrhagic -> VA ECMO -> decannulated on 8/28 to Impella 5.5 - Echo 08/28: Underfilled LV, EF 55-60% with severe LVH and near normal RV systolic function.  - Impella extracted 9/7 - Currently on epinephrine at 6. CO-OX 74% this am with stable BP.  Should be able to slowly wean epinephrine.  - Add midodrine 5 mg tid to help wean pressors.  - CVVH restarted, CVP down to 12 today.  Can continue CVVH net UF 50 cc/hr today, eventual transition to iHD for volume management.    2. Cardiac arrest (PEA/bradycardic) - 8/19 in setting of tamponade - PEA arrest again on 08/28  3. Cardiac tamponade with emergent bedside sternotomy  - Diffuse epicardial bleeding with post-op Dresslers - return to OR 8/21 and 8/24 for washouts.  - Washout 8/28 in OR. Multiple units of blood products in OR.   - CT/MT minimal  blood output.  - Chest closed on 9/2  4. Acute hypoxemic respiratory failure - Off ECMO.  - Vent support per CCM - Perc Trach placed 9/7  - He has completed course of Vanc + meropenem.  - s/p bronchoscopy w/ BAL 9/4. Cx NGTD   5. AKI due to ATN - CRRT started 08/28.  - CVVH today with net UF 50 cc/hr.  Would continue this today, eventual transition to iHD.    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 Continues on colchicine.  7. 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 ASA held as above  8. DM2 - continue SSI + levemir  9. PAF/AFL - Tolerates poorly.  - Recurrent AFL. S/p DCCV  to SR 08/29.  - SR today.   - now off Epi, can wean amio to 30/hr  - Not on [facility] for now, DVT dose heparin  10. ID - Completed vancomycin/meropenem with open chest.   11. Neuro -off sedation, following commands   12. FEN - TFs ongoing @ goal  13. Hyperkalemia - resolved with RRT.   14. GI  - vomiting w/ large volume NGT output + watery/foul diarrhea 9/5. KUB negative for ileus - C-diff negative  - now off Reglan, improving     CRITICAL CARE Performed by: Loralie Champagne  Total critical care time: 35 minutes  Critical care time was exclusive of separately billable procedures and treating other patients.  Critical care was necessary to treat or prevent imminent or life-threatening deterioration.  Critical care was time spent personally by me (independent of midlevel providers or residents) on the following activities: development of treatment  plan with patient and/or surrogate as well as nursing, discussions with consultants, evaluation of patient's response to treatment, examination of patient, obtaining history from patient or surrogate, ordering and performing treatments and interventions, ordering and review of laboratory studies, ordering and review of radiographic studies, pulse oximetry and re-evaluation of patient's condition.  Loralie Champagne, MD   8:28 AM

## 2021-10-20 NOTE — Progress Notes (Signed)
3 Days Post-Op Procedure(s) (LRB): REMOVAL OF IMPELLA LEFT VENTRICULAR ASSIST DEVICE (Right) TRANSESOPHAGEAL ECHOCARDIOGRAM (TEE) (N/A) PERCUTANEOUS TRACHEOSTOMY USING SHILEY FLEXIBLE 8 mm CUFFED TRACH. (N/A) Subjective: Sedated this AM, was very agitated overnight  Objective: Vital signs in last 24 hours: Temp:  [96.4 F (35.8 C)-98.8 F (37.1 C)] 97.8 F (36.6 C) (09/10 0749) Pulse Rate:  [67-85] 71 (09/10 0800) Cardiac Rhythm: Normal sinus rhythm (09/10 0800) Resp:  [9-27] 11 (09/10 0800) BP: (96-141)/(48-121) 96/84 (09/09 1000) SpO2:  [95 %-100 %] 100 % (09/10 0800) Arterial Line BP: (82-213)/(40-105) 118/51 (09/10 0800) FiO2 (%):  [40 %] 40 % (09/10 0415) Weight:  [93.1 kg] 93.1 kg (09/10 0500)  Hemodynamic parameters for last 24 hours: CVP:  [12 mmHg-40 mmHg] 16 mmHg  Intake/Output from previous day: 09/09 0701 - 09/10 0700 In: 1835.8 [I.V.:861.8; NG/GT:934] Out: 3464 [Emesis/NG output:625; Drains:22; Stool:775; Blood:49] Intake/Output this shift: Total I/O In: 42.7 [I.V.:42.7] Out: 66   General appearance: intubated, sedated Neurologic: delirium Heart: regular rate and rhythm Lungs: clear to auscultation bilaterally Abdomen: +BS, soft  Lab Results: Recent Labs    10/19/21 0441 10/20/21 0454  WBC 20.1* 19.9*  HGB 9.2* 8.7*  HCT 28.6* 27.4*  PLT 533* 502*   BMET:  Recent Labs    10/19/21 1610 10/20/21 0454  NA 135 135  K 4.2 4.0  CL 108 101  CO2 20* 21*  GLUCOSE 97 135*  BUN 47* 33*  CREATININE 3.48* 2.55*  CALCIUM 7.9* 8.4*    PT/INR:  Recent Labs    10/20/21 0454  LABPROT 15.9*  INR 1.3*   ABG    Component Value Date/Time   PHART 7.375 10/17/2021 0828   HCO3 25.0 10/17/2021 0828   TCO2 23 10/17/2021 0832   ACIDBASEDEF 2.0 10/07/2021 1814   O2SAT 74.2 10/20/2021 0457   CBG (last 3)  Recent Labs    10/19/21 2359 10/20/21 0344 10/20/21 0746  GLUCAP 98 128* 152*    Assessment/Plan: S/P Procedure(s) (LRB): REMOVAL OF IMPELLA  LEFT VENTRICULAR ASSIST DEVICE (Right) TRANSESOPHAGEAL ECHOCARDIOGRAM (TEE) (N/A) PERCUTANEOUS TRACHEOSTOMY USING SHILEY FLEXIBLE 8 mm CUFFED TRACH. (N/A) - NEURO- delirium persists, agitated overnight, sedated this AM CV- remains in SR  Co-ox 74 on epi @ 6 RESP- VDRF. Trach in place, weaning trials per CCM RENAL- oliguric ARF due to AKI  Back on CVVH with 2L off  Mild metabolic acidosis, K OK ENDO- CBG mildly elevated GI - on TF at goal ID- afebrile after completing course of Vanco and maxipime SQ Heparin + SCD PT/OT  LOS: 22 days    Melrose Nakayama 10/20/2021

## 2021-10-20 NOTE — Progress Notes (Addendum)
[facility] Progress Note  Name: [name] MRN: [id number] DOB: 03/08/1960   Subjective:  Seen and examined on CRRT.  Procedure supervised.  He had no UOP over 9/9.  CRRT was started back on 9/9 after brief line holiday (< 24 hours).  CHF team was concerned as CVP charted as 22.  Appreciate critical care placing nontunneled catheter.  He had 1.9 liters UF over 9/9 with CRRT with a UF goal net negative 50 ml/hr as tolerated.  He's now on epi at 9 mcg/min - requirement decreases when he is more awake per nursing.  He is off of feeds currently per nursing report.   Review of systems:     Unable to obtain 2/2 intubated and sedated    Intake/Output Summary (Last 24 hours) at 10/20/2021 0713 Last data filed at 10/20/2021 0700 Gross per 24 hour  Intake 1835.75 ml  Output 3389 ml  Net -1553.25 ml    Vitals:  Vitals:   10/20/21 0615 10/20/21 0630 10/20/21 0645 10/20/21 0700  BP:      Pulse: 70 70 71 70  Resp: 16 15 16 15   Temp:      TempSrc:      SpO2: 100% 100% 100% 100%  Weight:      Height:         Physical Exam:          General adult male in bed critically ill  HEENT normocephalic atraumatic  Neck supple trachea midline Lungs coarse mechanical breath sounds; FIO2 40 and PEEP 8 Heart S1S2 Abdomen soft nontender nondistended Extremities no pitting edema lower legs - some edema in hips   Neuro - sedation currently running  Access: RIJ nontunneled catheter    Medications reviewed   Labs:     Latest Ref Rng & Units 10/20/2021    4:54 AM 10/19/2021    4:10 PM 10/19/2021    4:41 AM  BMP  Glucose 70 - 99 mg/dL 135  97  196   BUN 8 - 23 mg/dL 33  47  47   Creatinine 0.61 - 1.24 mg/dL 2.55  3.48  3.53   Sodium 135 - 145 mmol/L 135  135  133   Potassium 3.5 - 5.1 mmol/L 4.0  4.2  4.0   Chloride 98 - 111 mmol/L 101  108  100   CO2 22 - 32 mmol/L 21  20  21    Calcium 8.9 - 10.3 mg/dL 8.4  7.9  8.5      Assessment/Plan:   Pt is a 61 y.o. yo male  with  history of HTN, HLD, DM, A-fib, stroke, CKD, CAD multivessel dz status post CABG on 8/3 presented with shortness of breath, found to have cardiac tamponade, course complicated by cardiac arrest, use of ECMO, seen as a consultation for the evaluation of AKI and fluid volume management.   #Acute kidney injury on CKD IIIa, nonoliguric: Ischemic ATN due to cardiogenic shock/cardiac arrest and multiple hemodynamic changes.  Started CRRT on 8/28 for decreasing urine output and fluid/volume management.  Brief (less than 24 hours) line free interval after clotting and CRRT resumed  on 9/9 after nontunneled line with critical care - Continue CRRT  - on 4K Fluids  - keep UF goal at net negative 50 ml/hr  - we transitioned to nepro when on CRRT holiday - see nepro is now off.  Feeds per primary team discretion  - team would prefer citrate if agent needed for clotting; note concern  for bleeding as above. No anticoagulation currently and hopeful that new line in IJ will have less issues  - I am hopeful for a transition to iHD soon   #Cardiac tamponade/hemorrhagic pericarditis status post pericardial drain placement with subsequent ECMO placement.  decannulated  with placement of Impella. On colchicine per cardiology S/p closure of the sternum and mediastinal washout on 9/2.   #Cardiogenic shock/cardiac arrest: Was on ECMO before. Continue pressors per primary team.  Impella out on 9/7    #Acute hypoxic respiratory failure: Currently intubated and sedated.  Vent per pulmonary.  optimizing volume with CRRT    #Acute blood loss anemia: Monitor hemoglobin and transfuse as needed by ICU team.  Aranesp 40 mcg weekly on Mondays started on 9/4.  Increased dose to 60 mcg weekly      #Hypophosphatemia due to CRRT, replete sodium phosphate as needed  Disposition - continue ICU monitoring  Claudia Desanctis, MD 10/20/2021 7:35 AM

## 2021-10-21 ENCOUNTER — Inpatient Hospital Stay (HOSPITAL_COMMUNITY): Payer: Self-pay

## 2021-10-21 LAB — GLUCOSE, CAPILLARY
Glucose-Capillary: 167 mg/dL — ABNORMAL HIGH (ref 70–99)
Glucose-Capillary: 125 mg/dL — ABNORMAL HIGH (ref 70–99)
Glucose-Capillary: 151 mg/dL — ABNORMAL HIGH (ref 70–99)
Glucose-Capillary: 145 mg/dL — ABNORMAL HIGH (ref 70–99)
Glucose-Capillary: 129 mg/dL — ABNORMAL HIGH (ref 70–99)

## 2021-10-21 LAB — RENAL FUNCTION PANEL
Albumin: 2.5 g/dL — ABNORMAL LOW (ref 3.5–5.0)
Albumin: 2.4 g/dL — ABNORMAL LOW (ref 3.5–5.0)
Anion gap: 13 (ref 5–15)
Anion gap: 9 (ref 5–15)
BUN: 25 mg/dL — ABNORMAL HIGH (ref 8–23)
BUN: 20 mg/dL (ref 8–23)
CO2: 22 mmol/L (ref 22–32)
CO2: 24 mmol/L (ref 22–32)
Calcium: 8.3 mg/dL — ABNORMAL LOW (ref 8.9–10.3)
Calcium: 8.4 mg/dL — ABNORMAL LOW (ref 8.9–10.3)
Chloride: 102 mmol/L (ref 98–111)
Chloride: 104 mmol/L (ref 98–111)
Creatinine, Ser: 1.67 mg/dL — ABNORMAL HIGH (ref 0.61–1.24)
Creatinine, Ser: 1.44 mg/dL — ABNORMAL HIGH (ref 0.61–1.24)
GFR, Estimated: 46 mL/min — ABNORMAL LOW (ref >60)
GFR, Estimated: 55 mL/min — ABNORMAL LOW (ref >60)
Glucose, Bld: 169 mg/dL — ABNORMAL HIGH (ref 70–99)
Glucose, Bld: 159 mg/dL — ABNORMAL HIGH (ref 70–99)
Phosphorus: 3.1 mg/dL (ref 2.5–4.6)
Phosphorus: 1.8 mg/dL — ABNORMAL LOW (ref 2.5–4.6)
Potassium: 4.3 mmol/L (ref 3.5–5.1)
Potassium: 4.3 mmol/L (ref 3.5–5.1)
Sodium: 137 mmol/L (ref 135–145)
Sodium: 137 mmol/L (ref 135–145)

## 2021-10-21 LAB — CBC
HCT: 28.1 % — ABNORMAL LOW (ref 39.0–52.0)
Hemoglobin: 8.9 g/dL — ABNORMAL LOW (ref 13.0–17.0)
MCH: 29.1 pg (ref 26.0–34.0)
MCHC: 31.7 g/dL (ref 30.0–36.0)
MCV: 91.8 fL (ref 80.0–100.0)
Platelets: 495 10*3/uL — ABNORMAL HIGH (ref 150–400)
RBC: 3.06 MIL/uL — ABNORMAL LOW (ref 4.22–5.81)
RDW: 16.7 % — ABNORMAL HIGH (ref 11.5–15.5)
WBC: 18.5 10*3/uL — ABNORMAL HIGH (ref 4.0–10.5)
nRBC: 0.3 % — ABNORMAL HIGH (ref 0.0–0.2)

## 2021-10-21 LAB — PROTIME-INR
INR: 1.3 — ABNORMAL HIGH (ref 0.8–1.2)
Prothrombin Time: 16 seconds — ABNORMAL HIGH (ref 11.4–15.2)

## 2021-10-21 LAB — VITAMIN D 25 HYDROXY (VIT D DEFICIENCY, FRACTURES): Vit D, 25-Hydroxy: 15.51 ng/mL — ABNORMAL LOW (ref 30–100)

## 2021-10-21 LAB — COOXEMETRY PANEL
Carboxyhemoglobin: 1.8 % — ABNORMAL HIGH (ref 0.5–1.5)
Methemoglobin: 0.7 % (ref 0.0–1.5)
O2 Saturation: 66.8 %
Total hemoglobin: 9.2 g/dL — ABNORMAL LOW (ref 12.0–16.0)

## 2021-10-21 LAB — LACTATE DEHYDROGENASE: LDH: 309 U/L — ABNORMAL HIGH (ref 98–192)

## 2021-10-21 LAB — FOLATE: Folate: 5.2 ng/mL — ABNORMAL LOW (ref >5.9)

## 2021-10-21 LAB — MAGNESIUM: Magnesium: 2.6 mg/dL — ABNORMAL HIGH (ref 1.7–2.4)

## 2021-10-21 LAB — C-REACTIVE PROTEIN: CRP: 15.5 mg/dL — ABNORMAL HIGH (ref <1.0)

## 2021-10-21 MED ORDER — VITAL 1.5 CAL PO LIQD
1000.0000 mL | ORAL | Status: DC
  Administered 2021-10-21: 1000 mL

## 2021-10-21 MED ORDER — FOLIC ACID 1 MG PO TABS
1.0000 mg | ORAL_TABLET | Freq: Every day | ORAL | Status: DC
  Administered 2021-10-21 – 2021-11-22 (×30): 1 mg
  Filled 2021-10-21 (×31): qty 1

## 2021-10-21 MED ORDER — MIDODRINE HCL 5 MG PO TABS
10.0000 mg | ORAL_TABLET | Freq: Three times a day (TID) | ORAL | Status: DC
  Administered 2021-10-21 (×3): 10 mg
  Filled 2021-10-21 (×4): qty 2

## 2021-10-21 MED ORDER — VITAL 1.5 CAL PO LIQD
1000.0000 mL | ORAL | Status: AC
  Administered 2021-10-21 – 2021-11-01 (×13): 1000 mL
  Filled 2021-10-21: qty 1000

## 2021-10-21 MED ORDER — COLLAGENASE 250 UNIT/GM EX OINT
TOPICAL_OINTMENT | Freq: Every day | CUTANEOUS | Status: DC
Start: 2021-10-21 — End: 2021-11-12
  Administered 2021-10-22 – 2021-10-31 (×2): 1 via TOPICAL
  Filled 2021-10-21 (×3): qty 30

## 2021-10-21 NOTE — Evaluation (Signed)
Occupational Therapy Evaluation Patient Details Name: [name] MRN: [id number] DOB: 1960-09-02 Today's Date: 10/21/2021   History of Present Illness 61 yo male admitted 8/19 with SOB, bradycardia and hypotension. Pt with bil pleural effusion and pericardial effusion s/p VATS with pericardial window 8/19. 8/20 cardiac tamponade with arrest,  intubated with bedside mediastinal exploration and ECMO via fem access. OR 8/21 for reexploration due to mediastinal hemorrhage. 8/28 OR for washout with ECMO decannulation and Impella placed. 8/29 brief PEA arrest with DCCV to NSR. 9/2 chest closure. 9/7 trach and Impella removed.9/11 VAC removed. PMHx: CAD s/p recent CABG x5 (admitted  8/3-11/23 with post op Afib), CKD stage II, DM, HTN, AKI, Afib and CVA   Clinical Impression   PTA, pt was living with his uncle and was independent per chart review; pt unable to provide information and PLOF collect from chart review. Pt currently requiring Total A for ADLs and bed mobility. Pt demonstrating poor attention, ROM, strength, activity tolerance, and functional performance. Pt followed one step commands and participated in ROM exercises (~3 reps before loosing attention). Pt would benefit from further acute OT to facilitate safe dc. Recommend dc to LTACH with further OT to optimize safety, independence with ADLs, and return to PLOF.      Recommendations for follow up therapy are one component of a multi-disciplinary discharge planning process, led by the attending physician.  Recommendations may be updated based on patient status, additional functional criteria and insurance authorization.   Follow Up Recommendations  Long-term institutional care without follow-up therapy    Assistance Recommended at Discharge Frequent or constant Supervision/Assistance  Patient can return home with the following  (Total A)    Functional Status Assessment     Equipment Recommendations  Other (comment) (Defer to next venue)     Recommendations for Other Services       Precautions / Restrictions Precautions Precautions: Other (comment);Fall;Sternal Precaution Booklet Issued: No Precaution Comments: CRRT, trach, vent, flexiseal, cortrak, NGT      Mobility Bed Mobility Overal bed mobility: Needs Assistance             General bed mobility comments: Max A for reposition    Transfers                          Balance                                           ADL either performed or assessed with clinical judgement   ADL Overall ADL's : Needs assistance/impaired     Grooming: Oral care;Bed level;Maximal assistance;Total assistance Grooming Details (indicate cue type and reason): Max-Total hand over hand to maintain grasp and brign oral swab to mouth. Able to attend for 30 sec but then needing for yo continue task                               General ADL Comments: Total A for ADLs     Vision         Perception     Praxis      Pertinent Vitals/Pain Pain Assessment Pain Assessment: Faces Faces Pain Scale: No hurt Pain Intervention(s): Monitored during session     Hand Dominance     Extremity/Trunk Assessment Upper Extremity Assessment Upper Extremity Assessment: Generalized  weakness   Lower Extremity Assessment Lower Extremity Assessment: Defer to PT evaluation RLE Deficits / Details: hip flexion grossly 2/5, knee flexion and extension grossly 2/5 LLE Deficits / Details: hip flexion 1/5, knee flexion and extension 2-/5       Communication Communication Communication: Tracheostomy   Cognition Arousal/Alertness: Awake/alert Behavior During Therapy: Flat affect Overall Cognitive Status: Impaired/Different from baseline Area of Impairment: Orientation, Attention, Following commands                 Orientation Level: Disoriented to, Time Current Attention Level: Sustained, Focused (Very short sustained attention; able to  compelte three reps of exercises before distraction)   Following Commands: Follows one step commands inconsistently, Follows one step commands with increased time       General Comments: Able to follow one step commands. Making eye contact. Slightly resless.     General Comments  HR 72, Spo2 100% on trach collar, A line 112/54, and RR 19    Exercises Exercises: General Upper Extremity, General Lower Extremity General Exercises - Upper Extremity Shoulder Flexion: AAROM, Both, 10 reps, Supine Elbow Flexion: AAROM, Both, 10 reps, Seated Elbow Extension: AAROM, Both, 10 reps, Supine Wrist Flexion: AROM, AAROM, 10 reps, Supine Wrist Extension: AROM, AAROM, Both, 10 reps, Supine Digit Composite Flexion: AROM, Both, 10 reps, Supine Composite Extension: AROM, Both, 10 reps, Supine General Exercises - Lower Extremity Long Arc Quad: AAROM, Both, Seated, 10 reps Hip Flexion/Marching: AAROM, PROM, Right, Left, Seated, 10 reps (PROM on LLE)   Shoulder Instructions      Home Living Family/patient expects to be discharged to:: Private residence Living Arrangements: Other relatives Available Help at Discharge: Family;Available 24 hours/day Type of Home: House Home Access: Stairs to enter [employer] of Steps: 14 Entrance Stairs-Rails: Left;Right;Can reach both Home Layout: Two level;Bed/bath upstairs     Bathroom Shower/Tub: Teacher, early years/pre: Handicapped height     Home Equipment: None   Additional Comments: home setup taken from prior admission as pt unable to provide      Prior Functioning/Environment Prior Level of Function : Independent/Modified Independent                        OT Problem List: Decreased strength;Decreased range of motion;Decreased activity tolerance;Impaired balance (sitting and/or standing);Decreased safety awareness;Decreased cognition;Decreased knowledge of use of DME or AE;Decreased knowledge of precautions;Impaired  UE functional use;Pain      OT Treatment/Interventions: Self-care/ADL training;Therapeutic exercise;DME and/or AE instruction;Cognitive remediation/compensation;Therapeutic activities;Balance training    OT Goals(Current goals can be found in the care plan section) Acute Rehab OT Goals Patient Stated Goal: Unstated OT Goal Formulation: With patient Time For Goal Achievement: 11/04/21 Potential to Achieve Goals: Good  OT Frequency: Min 2X/week    Co-evaluation              AM-PAC OT "6 Clicks" Daily Activity     Outcome Measure Help from another person eating meals?: Total Help from another person taking care of personal grooming?: Total Help from another person toileting, which includes using toliet, bedpan, or urinal?: Total Help from another person bathing (including washing, rinsing, drying)?: Total Help from another person to put on and taking off regular upper body clothing?: Total Help from another person to put on and taking off regular lower body clothing?: Total 6 Click Score: 6   End of Session Equipment Utilized During Treatment: Oxygen Nurse Communication: Mobility status  Activity Tolerance: Patient tolerated treatment well Patient  left: in bed;with call bell/phone within reach;with bed alarm set;with restraints reapplied;with SCD's reapplied  OT Visit Diagnosis: Unsteadiness on feet (R26.81);Other abnormalities of gait and mobility (R26.89);Muscle weakness (generalized) (M62.81)                Time: 3200-3794 OT Time Calculation (min): 27 min Charges:  OT General Charges $OT Visit: 1 Visit OT Evaluation $OT Eval Moderate Complexity: 1 Mod OT Treatments $Self Care/Home Management : 8-22 mins  Maxten Shuler MSOT, OTR/L Acute Rehab Office: [place] 10/21/2021, 5:44 PM

## 2021-10-21 NOTE — Progress Notes (Signed)
SLP Cancellation Note  Patient Details Name: [name] MRN: [id number] DOB: 03-29-1960   Cancelled treatment:       Reason Eval/Treat Not Completed: Patient not medically ready- Dr. Tacy Learn recommended awaiting TC trials, then proceed with PMV evaluation.  [name] L. Tivis Ringer, MA CCC/SLP Clinical Specialist - Acute Care SLP Acute Rehabilitation Services Office number [phone]    [name] 10/21/2021, 10:36 AM

## 2021-10-21 NOTE — Progress Notes (Addendum)
80 - Dr. Cyndia Bent at bedside, per Dr. Cyndia Bent, remove JP drain, remove wound vac and switch dressing to gauze and change daily, hydroxizine order to be changed from IM to per tube.   0830 - JP drain removed, wound vac removed, wound care done.   1315 - Sterile suctioning of trach performed by this RN. RT notified.   1325 - RT at bedside to help with suctioning of pt.   1410 - All wound care completed per wound care RN.   1450 - sterile trach suctioning completed by this RN.   1500 - OT at bedside   1600 - PRAFO's arrived to bedside, heel foam and dorsal foam applied bilaterally, then socks, then device.   1700 - visitor at bedside

## 2021-10-21 NOTE — Progress Notes (Signed)
NAME:  [name], MRN:  [id number], DOB:  1960/05/29, LOS: 23 ADMISSION DATE:  09/28/2021, CONSULTATION DATE:  8/20 REFERRING MD:  Kipp Brood, CHIEF COMPLAINT:  dyspnea   History of Present Illness:  61 y/o male underwent CABG on 8/3, readmitted on 8/19 in setting of dyspnea with a pericardial effusion requiring pericardial window via bedside sternotomy.  Has had pleural effusions, developed PEA arrest on 8/20, briefly required [facility] ECMO which was discontinued on 8/29, impella placed, started on CRRT.  Hypoxemia, repeat cardiac arrest on 8/30.    Pertinent  Medical History  CAD s/p CABG 8/3 DM2 Diverticulosis GERD Hyperlipidemia Hypertension History of stroke Tobacco Abuse  Significant Hospital Events: Including procedures, antibiotic start and stop dates in addition to other pertinent events   8/19 admitted, s/p pericardial window 8/20 PEA cardiac arrest, left pigtail chest tube placement, PEA cardiac arrest due to tamponade, bedside sternotomy with pericardial drains placed. 8/21 return to OR for overnight bleeding.  8/22 minimal chest tube output.  Atrial fibrillation, controled with amiodarone.  8/22 TEE showed EF 35% at baseline, which improved significantly with decreasing ECMO flow.  8/23 tolerated diuresis  8/24 mediastinal washout, removal of hematoma 8/25 ongoing bleeding issues from posterior mediastinum 8/29 decannulated and off ECMO, 5.5 Impella placed, L femoral HD cath placed and started on CRRT 8/30 hypoxia and hypotension followed by brief arrest, gentle L lateral chest compressions performed and ROSC in roughly 1 minute. 8/31 remains deeply sedated on ventilator with open chest.  No acute events overnight 9/1 no acute events overnight, remains on ventilator deep sedation.  Tolerating aggressive volume removal per CRRT 9/2 sternotomy closure 9/3 wean versed 9/4 FOB, low grade temps, notable thick mucus/frequent suctioning 9/5 Vomiting, ?mucus plugging. PSV 5-6  hours on 10/5 9/6 impella down to p3 9/7 impella removal and trach. PRBC and DDAVP in OR  9/8 on low dose epi, weaned on 8/8 9/9 RIJ HD catheter placed  Interim History / Subjective:  Rash still there but maybe a bit better, received Hydroxyzine at 3am. Currently denies itching.  Objective   Blood pressure (!) 98/48, pulse 75, temperature 97.6 F (36.4 C), temperature source Oral, resp. rate 16, height 5\' 7"  (1.702 m), weight 92.4 kg, SpO2 100 %. CVP:  [6 mmHg-20 mmHg] 13 mmHg  Vent Mode: PSV;CPAP FiO2 (%):  [40 %] 40 % Set Rate:  [16 bmp] 16 bmp Vt Set:  [530 mL] 530 mL PEEP:  [8 cmH20] 8 cmH20 Pressure Support:  [10 cmH20] 10 cmH20 Plateau Pressure:  [17 cmH20-23 cmH20] 17 cmH20   Intake/Output Summary (Last 24 hours) at 10/21/2021 0858 Last data filed at 10/21/2021 0700 Gross per 24 hour  Intake 1892.97 ml  Output 3583 ml  Net -1690.03 ml    Filed Weights   10/19/21 0500 10/20/21 0500 10/21/21 0500  Weight: 96.3 kg 93.1 kg 92.4 kg    Examination:  General: ill appearing man lying in bed in NAD Neuro: arouses to verbal stimulation, not moving as much today, but still moving both arms.  HEENT: [place]/AT, eyes anicteric CV:  S1S2, RRR PULM: CTAB, breathing comfortably on MV GI: soft, NT Extremities: Minimal edema, no cyanosis Skin: Warm, dry. Mild rash on upper trunk- maculopapular.   Coox 74% Na+ 135 BUN 33 Cr 2.55 WBC 19.9 H/H 8.7/27.4 Platelets 502 INR 1.3  Resolved Hospital Problem list   Acute RV failure > resolved  Assessment & Plan:   Acute metabolic encephalopathy, improving  -dilaudid gtt + oxycodone -versed PRN,  seroquel scheduled -goal RASS 0 -PT, OT, maximize as able -delirium precautions  Hemorrhagic pericarditis with tamponade PEA arrest due to tamponade, s/p emergent sternotomy Cardiogenic & hemorrhagic shock, s/p VA ECMO (decannulated 8/29), s/p Impella (removed 9/7) CAD s/p CABG x3 Afib  -epi PRN for goal MAP >65, hope to come off after  midodrine dose increase 9/11 -continue midodrine, amio, statin, ASA -per TCTS  Acute respiratory failure with hypoxemia and hypercarbia S/p Tracheostomy  -LTVV -VAP prevention protocol -PAD protocol for sedation -vent weaning  -routine trach care; sutures can come out on 9/14 -still needs CPT & nebs; he gets unstable with mucus plugs  AKI with anuria  -con't CRRT -renally dose meds, avoid nephrotoxic meds -strict I/Os  ABLA, previous hemorrhagic shock Thrombocytosis  -transfuse for Hb <7 or hemodynamically significant bleeding  Maculopapular rash- only new thing is banatrol. No history of food allergies. Banatrol stopped 9/10. -continue Benadryl PRN -monitor rash -monitor for other med reactions  Vomiting Diarrhea, improving -NGT to LIWS -KUB ordered 9/11 to check NG placement given inability to increase TF > 20/hr (comes back up NGT) -TF via cortak  DM II -SSI on hold due to low BG overnight; monitor today -goal BG 140-180  At Risk Malnutrition  -con't TF via cortrak  Lines/Tubes/Drains -FMS (8/25)  -trach (9/7) -L Fem HD cath (8/28- 9/8) -LIJ CVC (8/20)  -L rad art line (8/19)  -RIJ HD catheter (9/9>)  Best Practice (right click and "Reselect all SmartList Selections" daily)  Diet/type: tubefeeds  DVT prophylaxis: prophylactic heparin  GI prophylaxis: PPI Lines: Central line and Dialysis Catheter Foley:  N/A Code Status:  full code Last date of multidisciplinary goals of care discussion [per primary]  CC time: 30 min.   Montey Hora, [facility] For pager details, please see AMION or use Epic chat  After 1900, please call [place] for cross coverage needs 10/21/2021, 9:07 AM

## 2021-10-21 NOTE — Progress Notes (Signed)
4 Days Post-Op Procedure(s) (LRB): REMOVAL OF IMPELLA LEFT VENTRICULAR ASSIST DEVICE (Right) TRANSESOPHAGEAL ECHOCARDIOGRAM (TEE) (N/A) PERCUTANEOUS TRACHEOSTOMY USING SHILEY FLEXIBLE 8 mm CUFFED TRACH. (N/A) Subjective:  Hemodynamically stable but some orthostasis reported when nurse sat him up.  Still on epi 4. Co-ox 66.8  CVP 12  -1700 cc yesterday. Wt down 1.5 lbs to 203. Preop 222.  Has not been tolerating tube feeds higher than 20 cc hrs without increased output of TF from NG tube. Objective: Vital signs in last 24 hours: Temp:  [94.4 F (34.7 C)-97.8 F (36.6 C)] 97.6 F (36.4 C) (09/11 0415) Pulse Rate:  [63-83] 75 (09/11 0700) Cardiac Rhythm: Normal sinus rhythm (09/11 0400) Resp:  [0-21] 16 (09/11 0700) BP: (100-130)/(45-57) 112/50 (09/11 0346) SpO2:  [99 %-100 %] 100 % (09/11 0700) Arterial Line BP: (86-143)/(42-69) 96/49 (09/11 0700) FiO2 (%):  [40 %] 40 % (09/11 0400) Weight:  [92.4 kg] 92.4 kg (09/11 0500)  Hemodynamic parameters for last 24 hours: CVP:  [6 mmHg-20 mmHg] 13 mmHg  Intake/Output from previous day: 09/10 0701 - 09/11 0700 In: 1935.7 [I.V.:1074.4; NG/GT:831.3] Out: 3649 [Emesis/NG output:390; Drains:12; Stool:300; Blood:150] Intake/Output this shift: No intake/output data recorded.  General appearance: alert and cooperative Neurologic: following commands Heart: regular rate and rhythm, S1, S2 normal, no murmur Lungs: clear to auscultation bilaterally Abdomen: soft, non-tender; bowel sounds normal Extremities: edema mild Wound: chest dressing intact.   Lab Results: Recent Labs    10/20/21 0454 10/21/21 0403  WBC 19.9* 18.5*  HGB 8.7* 8.9*  HCT 27.4* 28.1*  PLT 502* 495*   BMET:  Recent Labs    10/20/21 1640 10/21/21 0403  NA 137 137  K 4.1 4.3  CL 105 102  CO2 23 22  GLUCOSE 179* 169*  BUN 28* 25*  CREATININE 1.86* 1.67*  CALCIUM 8.5* 8.3*    PT/INR:  Recent Labs    10/21/21 0403  LABPROT 16.0*  INR 1.3*   ABG     Component Value Date/Time   PHART 7.375 10/17/2021 0828   HCO3 25.0 10/17/2021 0828   TCO2 23 10/17/2021 0832   ACIDBASEDEF 2.0 10/07/2021 1814   O2SAT 66.8 10/21/2021 0408   CBG (last 3)  Recent Labs    10/20/21 1932 10/20/21 2352 10/21/21 0410  GLUCAP 128* 149* 167*   CXR: left lung aeration improved. Looks good overall.  Assessment/Plan: S/P Procedure(s) (LRB): REMOVAL OF IMPELLA LEFT VENTRICULAR ASSIST DEVICE (Right) TRANSESOPHAGEAL ECHOCARDIOGRAM (TEE) (N/A) PERCUTANEOUS TRACHEOSTOMY USING SHILEY FLEXIBLE 8 mm CUFFED TRACH. (N/A)  Hemodynamics fairly stable but still on epi for BP support. Will increase midodrine to 10 tid and should be able to get off epi.  Continue CRRT to keep negative.  Vent wean as tolerated per CCM.  Will check KUB to make sure feeding tube in duodenum since he is not tolerating increasing tube feed rate.  DC chest wound JP.  Can remove the Pravena dressing on chest and start dry dressing with daily change.   LOS: 23 days    [name] 10/21/2021

## 2021-10-21 NOTE — Progress Notes (Signed)
Pt was placed on trach collar 5L/28% per MD. Pt is tolerating well at this time.

## 2021-10-21 NOTE — Consult Note (Signed)
WOC Nurse Consult Note: Reason for Consult:multiple pressure injuries; noted when I arrived multiple skin issues requested to evaluate many other sites.  Wound type: Unstageable: Gluteal cleft;distal sacrum: 4cm x 1.5cm x 1cm; 90% pink granulation/10% black in base Unstageable: Left upper buttock; 50% pink/50% black/soft slough Stage 2 Pressure Injury; right upper buttock; 100% pink Stage 2 Pressure Injury: left ischium Midline sternal wound with stay sutures in place; 90% slough/10% pink opening between sutures Multiple chest tube sites: 100% yellow base; did not measure Right inner thigh surgical site; 10% yellow base/90% pink Sheer/friction; right scapula region; 100% yellow fibrinous; partial thickness skin loss  Diffuse patchy red rash over the entire body, intense under axillas but noted on the LEs, trunk and UEs Pressure Injury POA: No Measurement:see above  Wound bed:see above  Drainage (amount, consistency, odor) no real active drainage from any of the wounds  Periwound: intact  Dressing procedure/placement/frequency: Add Santyl for enzymatic debridement of the sacral and left ischial wounds Add hydrogel to the right inner thigh, moisture and autolytic debridement for minimal slough Add xeroform over the right buttock wound and continue foam over the left ischium and right buttock Continue silicone foam to the right scapula skin breakdown 1/4" iodoform packing for the old CT sites, just to serve as a wick for these areas and allow to heal by secondary intention Silver hydrofiber to the sternal wound to absorb drainage, may need some debridement at some point, CVTS to determine Continue Prevalon boots for offloading the heels bilaterally Continue the FMS to protect at risk areas from stool; management of moisture RD involved for nutritional support already   [facility] Nurse team will follow with you and see patient within 10 days for wound assessments.  Please notify [place] nurses of any  acute changes in the wounds or any new areas of concern Piatt MSN, [place], [place], [place]

## 2021-10-21 NOTE — Progress Notes (Signed)
Nutrition Follow-up  DOCUMENTATION CODES:   Not applicable  INTERVENTION:   Tube Feeding via Cortrak: Switch back to Vital 1.5 at 30 ml/hr, goal rate of 60 ml/hr Pro-Source TF20 60 mL TID This provides 2400 kcals, 157 g of protein and 1094 mL of free water  If pt still has TF coming out of NG, despite being feed via post-pyloric Cortrak,  after switching formula and titration back to goal rate, recommend initiation of TPN  Checking multiple vitamin labs given CRRT, wound, periods of inadequate nutrition, etc. Plan to check CRP, Vitamin A/C/D, folate, zinc, copper, thiamine. Continue to assess  Continue Renal MVI  Consider resuming lower dose of IV Reglan. Pt previously on very high dose.   NUTRITION DIAGNOSIS:   Inadequate oral intake related to acute illness as evidenced by NPO status.  Being addressed  GOAL:   Patient will meet greater than or equal to 90% of their needs  Not Met  MONITOR:   Vent status, Labs, Weight trends, TF tolerance  REASON FOR ASSESSMENT:   Ventilator    ASSESSMENT:   61 yo male admitted with acute on chronic CHF with recent CABG, symptomatic bradycardia and hypotension. PMH includes DM, CAD s/p recent CABG x 5, CKD 3, CVA  8/19 Pericardial window, evacuation of pericardial and pleural effusion 8/20 Intubated, Bedside mediastinal re-exploration, cardiac tamponade, mediastinal clot; PEA arrest due to tamponade, VA ECMO cannulation 8/21 Return to OR for bleeding, mediastinal re-exploration, Cortrak placed-gastric 8/23 TEE with EF 25-30% 8/25 Cortrak advanced to Post-Pyloric position 8/28 OR: VA ECMO decannulation, Impella placed, mediastinal washout, CRRT initiated 8/29 Brief arrest 9/02 OR: mediastinal washout, sternal closure 9/05 Vomiting large volume, TF held, NG placed to LIS, watery, foul smelling diarrhea, KUB negative for ileus/obstruction, confirmed Cortrak is post pyloric 9/07 OR: Impella removed, Trach placed 9/08 CRRT  held 9/09 CRRT restarted, MD changed to Nepro 9/10 TF off due to intolerance  Pt remains on vent support via trach; TC trials as able. Pt remains on CRRT Remains on epinephrine, hydromorphone  Wound VAC and JP drain removed today  WOC RN consulted today. Pt with unstageable and stage II pressure injuries.   Noted Nephrology changed TF to Nepro on Saturday.  Unsure why as pt electrolytes wdl and pt has experienced TF intolerance previously. On Sunday, Nepro held and restarted back at trickle has pt with increased NG output that looked "exactly like TF." Per RN. Abd xray confirms NG in good gastric placement and Cortrak in descending duodenum. Cortrak has pulled back some from initial placement, noted excess tube in stomach, but tip of Cortrak remains PP  TF infusing at just 20 ml/hr as unable to increase due to TF coming out of NG  Labs: potassium wdl, phosphorus wdl Meds: Rena-Vite  Diet Order:   Diet Order             Diet NPO time specified  Diet effective midnight                   EDUCATION NEEDS:   Not appropriate for education at this time  Skin:  Skin Assessment: Skin Integrity Issues: Skin Integrity Issues:: Unstageable Stage II: R upper buttock, L ischium Unstageable: L upper  buttock, gluteal cleft/distal sacrum Incisions: chest (closed, wound VAC removed) Other: sheer/friction to right scapula region  Last BM:  9/11 rectal tube  Height:   Ht Readings from Last 1 Encounters:  09/29/21 _0  (1.702 m)    Weight:   Wt Readings  from Last 1 Encounters:  10/21/21 92.4 kg    BMI:  Body mass index is 31.9 kg/m.  Estimated Nutritional Needs:   Kcal:  2200-2400 kcals  Protein:  135-165 g  Fluid:  1.8 L   Kerman Passey MS, RDN, LDN, CNSC Registered Dietitian 3 Clinical Nutrition RD Pager and On-Call Pager Number Located in [place]

## 2021-10-21 NOTE — Progress Notes (Signed)
Orthopedic Tech Progress Note Patient Details:  LEEVON UPPERMAN 1960/04/20 [id number]  Ortho Devices Type of Ortho Device: Prafo boot/shoe Ortho Device/Splint Location: BLE Ortho Device/Splint Interventions: Ordered, Application, Adjustment   Post Interventions Patient Tolerated: Well Instructions Provided: Care of device  Janit Pagan 10/21/2021, 5:02 PM

## 2021-10-21 NOTE — Progress Notes (Signed)
     [address]       [place] [address]             [phone]         Patient ID: [name], male   DOB: 05/01/1960, 61 y.o.   MRN: [id number] EVENING ROUNDS Overall stable day with ongoing dialysis Trach collering currently Agitated but follows commands Discussed wound issues with nurse and they have instituted appropriate dressings therapy with wound management involved Wearing now boots secondary to foot drop Continue present care

## 2021-10-21 NOTE — Progress Notes (Signed)
[facility] Progress Note  Name: NATHANAL HERMIZ MRN: [id number] DOB: 1960-09-22   Subjective:  Seen and examined on CRRT.  D/w RN.  He had no UOP over 9/10.  CRRT was started back on 9/9 after brief line holiday (< 24 hours).  UF 2.8L with CRRT yesterday.  Still having issues with TF at rate > 20.  Review of systems:     Unable to obtain 2/2 intubated and sedated    Intake/Output Summary (Last 24 hours) at 10/21/2021 1049 Last data filed at 10/21/2021 1000 Gross per 24 hour  Intake 1932.22 ml  Output 3625 ml  Net -1692.78 ml     Vitals:  Vitals:   10/21/21 0915 10/21/21 0930 10/21/21 0945 10/21/21 1000  BP:      Pulse: 76 77 76 76  Resp: 16 14 13 15   Temp:      TempSrc:      SpO2: 100% 100% 100% 100%  Weight:      Height:         Physical Exam:          General adult male in bed critically ill  HEENT normocephalic atraumatic   Neck supple trachea midline Lungs coarse mechanical breath sounds, no rales Heart S1S2 Abdomen soft nontender nondistended Extremities no pitting edema lower legs - some edema in hips   Neuro - sedation currently running  Access: RIJ nontunneled catheter    Medications reviewed   Labs:     Latest Ref Rng & Units 10/21/2021    4:03 AM 10/20/2021    4:40 PM 10/20/2021    4:54 AM  BMP  Glucose 70 - 99 mg/dL 169  179  135   BUN 8 - 23 mg/dL 25  28  33   Creatinine 0.61 - 1.24 mg/dL 1.67  1.86  2.55   Sodium 135 - 145 mmol/L 137  137  135   Potassium 3.5 - 5.1 mmol/L 4.3  4.1  4.0   Chloride 98 - 111 mmol/L 102  105  101   CO2 22 - 32 mmol/L 22  23  21    Calcium 8.9 - 10.3 mg/dL 8.3  8.5  8.4      Assessment/Plan:   Pt is a 61 y.o. yo male  with history of HTN, HLD, DM, A-fib, stroke, CKD, CAD multivessel dz status post CABG on 8/3 presented with shortness of breath, found to have cardiac tamponade, course complicated by cardiac arrest, use of ECMO, seen as a consultation for the evaluation of AKI and fluid volume  management.   #Acute kidney injury on CKD IIIa, nonoliguric: Ischemic ATN due to cardiogenic shock/cardiac arrest and multiple hemodynamic changes.  Started CRRT on 8/28 for decreasing urine output and fluid/volume management.  Brief (less than 24 hours) line free interval after clotting and CRRT resumed  on 9/9 after nontunneled line with critical care - Continue CRRT  - on 4K Fluids  - keep UF goal at net negative 50 ml/hr  - team would prefer citrate if agent needed for clotting -  No anticoagulation currently and hopeful that new line in IJ will have less issues  - transition to iHD as able.   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  decannulated  with placement of Impella. On colchicine per cardiology S/p closure of the sternum and mediastinal washout on 9/2.   #Cardiogenic shock/cardiac arrest: Was on ECMO before. Continue pressors per primary team.  Impella out  on 9/7    #Acute hypoxic respiratory failure: Currently intubated and sedated.  Vent per pulmonary.  optimizing volume with CRRT    #Acute blood loss anemia: Monitor hemoglobin and transfuse as needed by ICU team.  Aranesp 40 mcg weekly on Mondays started on 9/4.  Increased dose to 60 mcg weekly      #Hypophosphatemia due to CRRT, replete sodium phosphate as needed  Disposition - continue ICU monitoring  Justin Mend, MD 10/21/2021 10:49 AM

## 2021-10-21 NOTE — Progress Notes (Signed)
Orthopedic Tech Progress Note Patient Details:  [name] 07-20-1960 [id number]  Notified RN this morning that I'm waiting on a shipment of PRAFO BOOTS, so once they arrive I'll be there to get patient a pair  Patient ID: [name], male   DOB: 05/13/1960, 61 y.o.   MRN: [id number]  Janit Pagan 10/21/2021, 9:46 AM

## 2021-10-21 NOTE — Progress Notes (Signed)
Physical Therapy Treatment Patient Details Name: ERCELL RAZON MRN: [id number] DOB: [birth year] Today's Date: 10/21/2021   History of Present Illness 61 yo male admitted 8/19 with SOB, bradycardia and hypotension. Pt with bil pleural effusion and pericardial effusion s/p VATS with pericardial window 8/19. 8/20 cardiac tamponade with arrest,  intubated with bedside mediastinal exploration and ECMO via fem access. OR 8/21 for reexploration due to mediastinal hemorrhage. 8/28 OR for washout with ECMO decannulation and Impella placed. 8/29 brief PEA arrest with DCCV to NSR. 9/2 chest closure. 9/7 trach and Impella removed.9/11 VAC removed. PMHx: CAD s/p recent CABG x5 (admitted  8/3-11/23 with post op Afib), CKD stage II, DM, HTN, AKI, Afib and CVA    PT Comments    Pt with flat affect, intermittent smiling and able to acknowledge sacral pain at times. Pt with assist to roll and position in bed. Pt with initial tilt this session with tilt to 30 degrees for 5 min, up to 40 degrees for 3 min and back to 30 degrees for additional 2 min. Pt tolerating well with BP with limited tolerance to maintain quad contraction particularly on left with 40 degrees and tendency to hyperextend with physical assist for extension and partial flexion to limit hyperextension. Will continue to follow with pt placed in chair position end of session.   112/57 supine 102/52 30 degrees 130/60 40 degrees 100/52 (74) end of session HR 74-78 CPAP/PS 40%, peep 8, SpO2 100%    Recommendations for follow up therapy are one component of a multi-disciplinary discharge planning process, led by the attending physician.  Recommendations may be updated based on patient status, additional functional criteria and insurance authorization.  Follow Up Recommendations  PT at Long-term acute care hospital Can patient physically be transported by private vehicle: No   Assistance Recommended at Discharge Frequent or constant  Supervision/Assistance  Patient can return home with the following Two people to help with walking and/or transfers;Two people to help with bathing/dressing/bathroom;Direct supervision/assist for medications management;Assistance with feeding;Assistance with cooking/housework;Direct supervision/assist for financial management;Assist for transportation   Equipment Recommendations  Other (comment) (TBD)    Recommendations for Other Services       Precautions / Restrictions Precautions Precautions: Other (comment);Fall;Sternal Precaution Booklet Issued: No Precaution Comments: CRRT, trach, vent, flexiseal, cortrak, NGT     Mobility  Bed Mobility Overal bed mobility: Needs Assistance Bed Mobility: Rolling Rolling: Max assist         General bed mobility comments: max assist to roll for pad positioning. Total assist +2 to slide toward [facility] and with bed positioning into chair and tilt    Transfers                   General transfer comment: tilt 30-40degrees 10 min with pt tolerance, physical assist and max cues to contract quad with limited maintained contraction of LLE    Ambulation/Gait               General Gait Details: unable   Stairs             Wheelchair Mobility    Modified Rankin (Stroke Patients Only)       Balance Overall balance assessment: Needs assistance           Standing balance-Leahy Scale: Poor Standing balance comment: left lean in tilt to 40 degrees  Cognition Arousal/Alertness: Awake/alert Behavior During Therapy: Flat affect Overall Cognitive Status: Impaired/Different from baseline Area of Impairment: Orientation, Attention, Following commands                   Current Attention Level: Focused   Following Commands: Follows one step commands inconsistently, Follows one step commands with increased time       General Comments: pt reporting intermittent sacral pain,  following commands for bil LE movement intermittently, slightly restless        Exercises General Exercises - Lower Extremity Mini-Sqauts: AAROM, Both, 10 reps, Standing (at tilt of 30 and 40 degrees)    General Comments        Pertinent Vitals/Pain Pain Assessment Pain Assessment: CPOT Facial Expression: Relaxed, neutral Body Movements: Protection Muscle Tension: Relaxed Compliance with ventilator (intubated pts.): Tolerating ventilator or movement Vocalization (extubated pts.): N/A CPOT Total: 1    Home Living                          Prior Function            PT Goals (current goals can now be found in the care plan section) Progress towards PT goals: Progressing toward goals    Frequency    Min 3X/week      PT Plan Current plan remains appropriate    Co-evaluation              AM-PAC PT "6 Clicks" Mobility   Outcome Measure  Help needed turning from your back to your side while in a flat bed without using bedrails?: Total Help needed moving from lying on your back to sitting on the side of a flat bed without using bedrails?: Total Help needed moving to and from a bed to a chair (including a wheelchair)?: Total Help needed standing up from a chair using your arms (e.g., wheelchair or bedside chair)?: Total Help needed to walk in hospital room?: Total Help needed climbing 3-5 steps with a railing? : Total 6 Click Score: 6    End of Session   Activity Tolerance: Patient tolerated treatment well Patient left: in bed;with call bell/phone within reach;with nursing/sitter in room Nurse Communication: Mobility status PT Visit Diagnosis: Other abnormalities of gait and mobility (R26.89);Difficulty in walking, not elsewhere classified (R26.2);Muscle weakness (generalized) (M62.81)     Time: 9201-0071 PT Time Calculation (min) (ACUTE ONLY): 32 min  Charges:  $Therapeutic Activity: 23-37 mins                     [name], PT Acute  Rehabilitation Services Office: [phone]    Sandy Salaam Shariq Puig 10/21/2021, 9:51 AM

## 2021-10-21 NOTE — Progress Notes (Addendum)
Patient ID: [name], male   DOB: [birth year], 61 y.o.   MRN: [id number]   Advanced Heart Failure Rounding Note   Subjective:    - 8/19 Pericardial window - 8/20 Cardiac arrest with tamponade -> Emergent bedside washout - 09/29/21 VA Cannulation - 09/30/21 Return to OR for mediastinal hemorrhage - 10/01/21 Developed AF -> amio - 10/02/21 TEE EF 25-30%  - 10/04/21 OR for washout. C/b continued bleeding overnight - 10/07/21 Placement of Impella 5.5 with washout, VA ECMO decannulation. Hypotensive with development of severe RV dysfunction after ECMO off and pressors titrated up. Multiple units of blood products. - 10/08/21 Brief PEA arrest. AFL with RVR >> S/p DCCV to SR, back in AFL shortly after - 8/31 Give 1UPRBCs  - 9/2 OR for chest closure - 9/3 Hypotensive overnight w/ SBPs in 80s. Febrile, mTemp 100.8. CRRT paused. VP increased to 0.04.  - 9/4 s/p bronchoscopy w/ BAL by PCCM, Cx NGTD  - 9/5 vomiting w/ large volume NGT output + watery/foul diarrhea, Tube feeds held. No signs of ileus on KUB. C-diff negative   - 9/7 OR for Impella Extraction and percutaneous tracheostomy. 2 u RBCs. - 9/8 CVVH stopped and line removed for holiday - 9/9 CVVH restarted.    CO-Ox 67% on Epi 4.   Midodrine increased to 10 TID this am to assist with Epi wean.  CVP 11, 2.8L volume removed yesterday with CRRT. Currently pulling for negative 50/hr.  NSR on amio gtt at 30/hr   Hgb 7.8>8.8>9.4>9.2>8.7>8.9  Tube feeds only running at 20/hr, at high rate has increased output from NG tube.  Awake and following commands.     Echo 08/28: LV EF 55-60% with severe LVH, small ventricular cavity, RV appears to have near normal systolic function   Objective:     Vital Signs:   Temp:  [94.4 F (34.7 C)-97.6 F (36.4 C)] 97.6 F (36.4 C) (09/11 0415) Pulse Rate:  [63-83] 75 (09/11 0700) Resp:  [0-21] 16 (09/11 0700) BP: (98-112)/(45-53) 98/48 (09/11 0742) SpO2:  [99 %-100 %] 100 % (09/11 0700) Arterial  Line BP: (86-143)/(42-69) 96/49 (09/11 0700) FiO2 (%):  [40 %] 40 % (09/11 0742) Weight:  [92.4 kg] 92.4 kg (09/11 0500) Last BM Date : 10/20/21  Weight change: Filed Weights   10/19/21 0500 10/20/21 0500 10/21/21 0500  Weight: 96.3 kg 93.1 kg 92.4 kg    Intake/Output:   Intake/Output Summary (Last 24 hours) at 10/21/2021 0837 Last data filed at 10/21/2021 0700 Gross per 24 hour  Intake 1892.97 ml  Output 3583 ml  Net -1690.03 ml    PHYSICAL EXAM: General:  Awake and answering questions HEENT: normal Neck: supple. Jvp 10-12. Carotids 2+ bilat; no bruits.  Cor: PMI nondisplaced. Regular rate & rhythm. No rubs, gallops or murmurs. Sternum closed. Lungs: clear Abdomen: soft, nontender, nondistended, + BS Extremities: no cyanosis, clubbing, rash, trace edema Neuro: Awke.    Telemetry: SR 80s   Labs: Basic Metabolic Panel: Recent Labs  Lab 10/17/21 0400 10/17/21 0828 10/18/21 0331 10/18/21 1555 10/19/21 0441 10/19/21 1610 10/20/21 0454 10/20/21 1640 10/21/21 0403  NA 135   < > 136   < > 133* 135 135 137 137  K 4.1   < > 4.0   < > 4.0 4.2 4.0 4.1 4.3  CL 101   < > 103   < > 100 108 101 105 102  CO2 24   < > 23   < > 21* 20* 21* 23 22  GLUCOSE 103*   < > 152*   < > 196* 97 135* 179* 169*  BUN 20   < > 24*   < > 47* 47* 33* 28* 25*  CREATININE 1.73*   < > 1.79*   < > 3.53* 3.48* 2.55* 1.86* 1.67*  CALCIUM 8.5*   < > 7.8*   < > 8.5* 7.9* 8.4* 8.5* 8.3*  MG 2.5*  --  2.3  --  2.6*  --  2.5*  --  2.6*  PHOS 2.9   < > 2.8   < > 3.2 3.0 2.7 2.6 3.1   < > = values in this interval not displayed.    Liver Function Tests: Recent Labs  Lab 10/19/21 0441 10/19/21 1610 10/20/21 0454 10/20/21 1640 10/21/21 0403  ALBUMIN 2.4* 2.3* 2.4* 2.5* 2.5*   No results for input(s): "LIPASE", "AMYLASE" in the last 168 hours. No results for input(s): "AMMONIA" in the last 168 hours.  CBC: Recent Labs  Lab 10/18/21 0331 10/18/21 1555 10/19/21 0441 10/20/21 0454  10/21/21 0403  WBC 16.9* 19.1* 20.1* 19.9* 18.5*  HGB 9.4* 9.1* 9.2* 8.7* 8.9*  HCT 28.7* 28.4* 28.6* 27.4* 28.1*  MCV 91.1 92.5 93.2 92.3 91.8  PLT 481* 528* 533* 502* 495*    Cardiac Enzymes: No results for input(s): "CKTOTAL", "CKMB", "CKMBINDEX", "TROPONINI" in the last 168 hours.  BNP: BNP (last 3 results) Recent Labs    12/24/20 0651 09/28/21 0405  BNP 412.0* 826.6*    ProBNP (last 3 results) No results for input(s): "PROBNP" in the last 8760 hours.    Other results:  Imaging: DG Chest Port 1 View  Result Date: 10/21/2021 CLINICAL DATA:  Status post CABG EXAM: PORTABLE CHEST 1 VIEW COMPARISON:  10/19/2021 FINDINGS: There is a right IJ catheter with tip in the projection of the SVC. Stable tracheostomy tube. Feeding tube and NG tube are stable with tips below the level of the hemidiaphragms. Interval decrease in left pleural effusion with improved aeration to the left lung base. IMPRESSION: Decrease in left pleural effusion with improved aeration to the left lung base. Electronically Signed   By: Kerby Moors M.D.   On: 10/21/2021 06:50   DG CHEST PORT 1 VIEW  Result Date: 10/19/2021 CLINICAL DATA:  Central venous catheter in place EXAM: PORTABLE CHEST 1 VIEW COMPARISON:  Chest x-rays dated 10/17/2021. FINDINGS: Tracheostomy tube is stable in position with tip in the midline. LEFT IJ central line is stable in position with tip at the level of the upper SVC. RIGHT IJ Cordis in place with tip above the level of the SVC. Enteric tube passes below the diaphragm. Heart size and mediastinal contours are grossly stable. Persistent opacity at the LEFT lung base. RIGHT lung is clear. No pneumothorax is seen. IMPRESSION: 1. Support apparatus appears appropriately positioned, as detailed above. 2. Persistent opacity at the LEFT lung base, likely atelectasis and/or small pleural effusion. No new lung findings. No pneumothorax. Electronically Signed   By: Franki Cabot M.D.   On:  10/19/2021 12:24     Medications:     Scheduled Medications:  sodium chloride   Intravenous Once   aspirin  81 mg Per Tube Daily   atorvastatin  80 mg Per Tube Daily   Chlorhexidine Gluconate Cloth  6 each Topical Daily   clonazePAM  1 mg Per Tube Q8H   darbepoetin (ARANESP) injection - NON-DIALYSIS  60 mcg Subcutaneous Q Mon-1800   feeding supplement (PROSource TF20)  60 mL Per Tube  TID   heparin injection (subcutaneous)  5,000 Units Subcutaneous Q8H   midodrine  10 mg Per Tube TID WC   multivitamin  1 tablet Per Tube QHS   mouth rinse  15 mL Mouth Rinse Q2H   oxyCODONE  20 mg Per Tube Q6H   pantoprazole (PROTONIX) IV  40 mg Intravenous Q12H   QUEtiapine  50 mg Per Tube BID   sennosides  5 mL Per Tube QHS   sodium chloride flush  10-40 mL Intracatheter Q12H   sodium chloride flush  3 mL Intravenous Q12H    Infusions:   prismasol BGK 4/2.5 500 mL/hr at 10/21/21 0813    prismasol BGK 4/2.5 600 mL/hr at 10/20/21 2341   sodium chloride Stopped (10/17/21 1812)   sodium chloride     albumin human Stopped (10/15/21 1901)   amiodarone 30 mg/hr (10/21/21 0700)   epinephrine 4 mcg/min (10/21/21 0700)   feeding supplement (NEPRO CARB STEADY) 20 mL/hr at 10/21/21 0700   HYDROmorphone 1.5 mg/hr (10/21/21 0700)   prismasol BGK 4/2.5 1,800 mL/hr at 10/21/21 0655   vasopressin Stopped (10/19/21 1825)    PRN Medications: sodium chloride, albumin human, camphor-menthol, dextrose, diphenhydrAMINE, heparin, hydrALAZINE, HYDROmorphone, ipratropium-albuterol, midazolam, ondansetron (ZOFRAN) IV, polyvinyl alcohol, sodium chloride flush, sodium chloride flush   Assessment/Plan:    1. Shock - mixed cardiogenic/hemorrhagic -> VA ECMO -> decannulated on 8/28 to Impella 5.5 - Echo 08/28: Underfilled LV, EF 55-60% with severe LVH and near normal RV systolic function.  - Impella extracted 9/7 - Currently on epinephrine at. CO-OX 74% this am with stable BP.  Should be able to slowly wean  epinephrine.  - Agree with increasing midodrine to 10 TID to facilitate epi wean - CVVH restarted, CVP down to 11 today.  Can continue CVVH net UF 50 cc/hr today, eventual transition to iHD for volume management.    2. Cardiac arrest (PEA/bradycardic) - 8/19 in setting of tamponade - PEA arrest again on 08/28  3. Cardiac tamponade with emergent bedside sternotomy  - Diffuse epicardial bleeding with post-op Dresslers - return to OR 8/21 and 8/24 for washouts.  - Washout 8/28 in OR. Multiple units of blood products in OR.   - CT/MT minimal blood output.  - Chest closed on 9/2 - Hgb now stable.  4. Acute hypoxemic respiratory failure - Off ECMO.  - Vent support per CCM - Perc Trach placed 9/7  - He has completed course of Vanc + meropenem.  - s/p bronchoscopy w/ BAL 9/4. Cx NGTD   5. AKI due to ATN - CRRT started 08/28.  - CVVH today with net UF 50 cc/hr.  Eventual transition to iHD.    6. 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 Now off colchicine with CRRT.  7. 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 Continue aspirin 81 mg daily  8. DM2 - continue SSI + levemir  9. PAF/AFL - Tolerates poorly.  - Recurrent AFL. S/p DCCV  to SR 08/29.  - SR today.   - Continue amio gtt, eventually switch to po once off pressors - Not on [facility] for now, DVT dose heparin  10. ID - Completed vancomycin/meropenem with open chest.   11. Neuro -off sedation, following commands   12. FEN - TFs ongoing  - Increased NG tube output with TF rate > 20/hr, KUB today to assess NG tube placement   13. Hyperkalemia - resolved with RRT.   14. GI  - vomiting w/ large volume  NGT output + watery/foul diarrhea 9/5. KUB negative for ileus - C-diff negative  - now off Reglan, improving   - see above regarding TFs   FINCH, LINDSAY N, PA-C  8:37 AM  Agree with above.   Remains on vent through trach. Remains on epi for BP support. On midodrine as well. Co-ox looks good.  Volume status improved on CVVHD. Struggling with TFs. KUB looks ok.   General:  Awake following commands HEENT: normal + cor-trak Neck: supple. RIJ HD cath  Carotids 2+ bilat; no bruits. No lymphadenopathy or thryomegaly appreciated. Cor: PMI nondisplaced. Regular rate & rhythm. No rubs, gallops or murmurs. Lungs: clear Abdomen: soft, nontender, nondistended. No hepatosplenomegaly. No bruits or masses. Hypoactive bowel sounds. Extremities: no cyanosis, clubbing, rash, edema + mittens Neuro: alert & orientedx3, cranial nerves grossly intact. moves all 4 extremities w/o difficulty. Affect pleasant  Remains tenuous but improving slowly. Wound vac removed. Wean epi as tolerated. Ambulate. Continue CVVHD with mild net negative.   CRITICAL CARE Performed by: Glori Bickers  Total critical care time: 35 minutes  Critical care time was exclusive of separately billable procedures and treating other patients.  Critical care was necessary to treat or prevent imminent or life-threatening deterioration.  Critical care was time spent personally by me (independent of midlevel providers or residents) on the following activities: development of treatment plan with patient and/or surrogate as well as nursing, discussions with consultants, evaluation of patient's response to treatment, examination of patient, obtaining history from patient or surrogate, ordering and performing treatments and interventions, ordering and review of laboratory studies, ordering and review of radiographic studies, pulse oximetry and re-evaluation of patient's condition.  Glori Bickers, MD  10:29 AM

## 2021-10-22 DIAGNOSIS — D649 Anemia, unspecified: Secondary | ICD-10-CM

## 2021-10-22 LAB — HEPATIC FUNCTION PANEL
ALT: 22 U/L (ref 0–44)
AST: 38 U/L (ref 15–41)
Albumin: 2.4 g/dL — ABNORMAL LOW (ref 3.5–5.0)
Alkaline Phosphatase: 137 U/L — ABNORMAL HIGH (ref 38–126)
Bilirubin, Direct: 0.3 mg/dL — ABNORMAL HIGH (ref 0.0–0.2)
Indirect Bilirubin: 0.7 mg/dL (ref 0.3–0.9)
Total Bilirubin: 1 mg/dL (ref 0.3–1.2)
Total Protein: 6.2 g/dL — ABNORMAL LOW (ref 6.5–8.1)

## 2021-10-22 LAB — RENAL FUNCTION PANEL
Albumin: 2.4 g/dL — ABNORMAL LOW (ref 3.5–5.0)
Albumin: 2.4 g/dL — ABNORMAL LOW (ref 3.5–5.0)
Anion gap: 8 (ref 5–15)
Anion gap: 9 (ref 5–15)
BUN: 21 mg/dL (ref 8–23)
BUN: 19 mg/dL (ref 8–23)
CO2: 24 mmol/L (ref 22–32)
CO2: 21 mmol/L — ABNORMAL LOW (ref 22–32)
Calcium: 8.2 mg/dL — ABNORMAL LOW (ref 8.9–10.3)
Calcium: 7.6 mg/dL — ABNORMAL LOW (ref 8.9–10.3)
Chloride: 104 mmol/L (ref 98–111)
Chloride: 106 mmol/L (ref 98–111)
Creatinine, Ser: 1.33 mg/dL — ABNORMAL HIGH (ref 0.61–1.24)
Creatinine, Ser: 1.16 mg/dL (ref 0.61–1.24)
GFR, Estimated: >60 mL/min (ref >60)
GFR, Estimated: >60 mL/min (ref >60)
Glucose, Bld: 205 mg/dL — ABNORMAL HIGH (ref 70–99)
Glucose, Bld: 261 mg/dL — ABNORMAL HIGH (ref 70–99)
Phosphorus: 1.5 mg/dL — ABNORMAL LOW (ref 2.5–4.6)
Phosphorus: 3.2 mg/dL (ref 2.5–4.6)
Potassium: 3.8 mmol/L (ref 3.5–5.1)
Potassium: 4.5 mmol/L (ref 3.5–5.1)
Sodium: 136 mmol/L (ref 135–145)
Sodium: 136 mmol/L (ref 135–145)

## 2021-10-22 LAB — ECHOCARDIOGRAM LIMITED
Height: 67 in
Weight: 3823.66 oz

## 2021-10-22 LAB — GLUCOSE, CAPILLARY
Glucose-Capillary: 134 mg/dL — ABNORMAL HIGH (ref 70–99)
Glucose-Capillary: 168 mg/dL — ABNORMAL HIGH (ref 70–99)
Glucose-Capillary: 189 mg/dL — ABNORMAL HIGH (ref 70–99)
Glucose-Capillary: 190 mg/dL — ABNORMAL HIGH (ref 70–99)
Glucose-Capillary: 191 mg/dL — ABNORMAL HIGH (ref 70–99)
Glucose-Capillary: 223 mg/dL — ABNORMAL HIGH (ref 70–99)
Glucose-Capillary: 239 mg/dL — ABNORMAL HIGH (ref 70–99)

## 2021-10-22 LAB — CBC
HCT: 28 % — ABNORMAL LOW (ref 39.0–52.0)
Hemoglobin: 8.5 g/dL — ABNORMAL LOW (ref 13.0–17.0)
MCH: 28.7 pg (ref 26.0–34.0)
MCHC: 30.4 g/dL (ref 30.0–36.0)
MCV: 94.6 fL (ref 80.0–100.0)
Platelets: 497 10*3/uL — ABNORMAL HIGH (ref 150–400)
RBC: 2.96 MIL/uL — ABNORMAL LOW (ref 4.22–5.81)
RDW: 16.6 % — ABNORMAL HIGH (ref 11.5–15.5)
WBC: 18.6 10*3/uL — ABNORMAL HIGH (ref 4.0–10.5)
nRBC: 0.5 % — ABNORMAL HIGH (ref 0.0–0.2)

## 2021-10-22 LAB — COOXEMETRY PANEL
Carboxyhemoglobin: 2.4 % — ABNORMAL HIGH (ref 0.5–1.5)
Carboxyhemoglobin: 1.4 % (ref 0.5–1.5)
Methemoglobin: 2.3 % — ABNORMAL HIGH (ref 0.0–1.5)
Methemoglobin: <0.7 % (ref 0.0–1.5)
O2 Saturation: 48.2 %
O2 Saturation: 65 %
Total hemoglobin: 10.3 g/dL — ABNORMAL LOW (ref 12.0–16.0)
Total hemoglobin: 10.3 g/dL — ABNORMAL LOW (ref 12.0–16.0)

## 2021-10-22 LAB — MAGNESIUM: Magnesium: 2.4 mg/dL (ref 1.7–2.4)

## 2021-10-22 MED ORDER — CLONAZEPAM 0.5 MG PO TABS
0.5000 mg | ORAL_TABLET | Freq: Three times a day (TID) | ORAL | Status: DC
  Administered 2021-10-22 – 2021-10-29 (×20): 0.5 mg
  Filled 2021-10-22 (×20): qty 1

## 2021-10-22 MED ORDER — INSULIN GLARGINE-YFGN 100 UNIT/ML ~~LOC~~ SOLN
8.0000 [IU] | Freq: Every day | SUBCUTANEOUS | Status: DC
  Administered 2021-10-22: 8 [IU] via SUBCUTANEOUS
  Filled 2021-10-22 (×2): qty 0.08

## 2021-10-22 MED ORDER — VASOPRESSIN 20 UNITS/100 ML INFUSION FOR SHOCK
0.0000 [IU]/min | INTRAVENOUS | Status: DC
  Administered 2021-10-22 – 2021-10-24 (×6): 0.03 [IU]/min via INTRAVENOUS
  Administered 2021-10-25: 0.04 [IU]/min via INTRAVENOUS
  Administered 2021-10-25: 0.03 [IU]/min via INTRAVENOUS
  Administered 2021-10-25 – 2021-10-28 (×8): 0.04 [IU]/min via INTRAVENOUS
  Filled 2021-10-22 (×16): qty 100

## 2021-10-22 MED ORDER — MIDODRINE HCL 5 MG PO TABS
15.0000 mg | ORAL_TABLET | Freq: Three times a day (TID) | ORAL | Status: DC
  Administered 2021-10-22 – 2021-11-04 (×39): 15 mg
  Filled 2021-10-22 (×38): qty 3

## 2021-10-22 MED ORDER — ORAL CARE MOUTH RINSE
15.0000 mL | OROMUCOSAL | Status: DC | PRN
  Administered 2021-10-22: 15 mL via OROMUCOSAL

## 2021-10-22 MED ORDER — INSULIN ASPART 100 UNIT/ML IJ SOLN
0.0000 [IU] | INTRAMUSCULAR | Status: DC
  Administered 2021-10-22: 2 [IU] via SUBCUTANEOUS
  Administered 2021-10-22 (×2): 5 [IU] via SUBCUTANEOUS
  Administered 2021-10-22 (×3): 3 [IU] via SUBCUTANEOUS
  Administered 2021-10-23: 8 [IU] via SUBCUTANEOUS
  Administered 2021-10-23: 5 [IU] via SUBCUTANEOUS

## 2021-10-22 MED ORDER — POTASSIUM PHOSPHATES 15 MMOLE/5ML IV SOLN
30.0000 mmol | Freq: Once | INTRAVENOUS | Status: AC
  Administered 2021-10-22: 30 mmol via INTRAVENOUS
  Filled 2021-10-22: qty 10

## 2021-10-22 MED ORDER — ACETAMINOPHEN 160 MG/5ML PO SOLN
650.0000 mg | Freq: Four times a day (QID) | ORAL | Status: DC
  Administered 2021-10-22 – 2021-11-03 (×46): 650 mg
  Filled 2021-10-22 (×43): qty 20.3

## 2021-10-22 MED ORDER — ORAL CARE MOUTH RINSE
15.0000 mL | OROMUCOSAL | Status: DC
  Administered 2021-10-22 – 2021-11-30 (×129): 15 mL via OROMUCOSAL

## 2021-10-22 NOTE — Progress Notes (Signed)
[facility] Progress Note Patient Name: LENORD FRALIX DOB: 1960/08/08 MRN: [id number]   Date of Service  10/22/2021  HPI/Events of Note  BP 72/38, heart rate 44, down from baseline of 60 on Amiodarone and Dilaudid gtt.  eICU Interventions  Amiodarone and Dilaudid gtt paused, Epinephrine gtt titrated up to 17 mcg with increase in BP to 100/48, MAP 62, HR 45. Plan is to continue to hold Amiodarone and Dilaudid gtt and keep Epinephrine gtt at 17 mcg until MAP > 65 and HR > 55 as Amiodarone levels drop in the serum, then wean Epinephrine gtt as tolerated to maintain above referenced parameters.        Kerry Kass Celica Kotowski 10/22/2021, 11:20 PM

## 2021-10-22 NOTE — Progress Notes (Signed)
eLink Physician-Brief Progress Note Patient Name: [name] DOB: 04-23-1960 MRN: [id number]   Date of Service  10/22/2021  HPI/Events of Note  Notified of hyperglycemia with glucose at 190.  Pt is now on tube feeds.   eICU Interventions  Start on insulin sliding scale q4hrs.      Intervention Category Intermediate Interventions: Hyperglycemia - evaluation and treatment  Elsie Lincoln 10/22/2021, 2:35 AM

## 2021-10-22 NOTE — Progress Notes (Signed)
[facility] Progress Note  Name: [name] MRN: [id number] DOB: [birth year]   Subjective:  Seen and examined on CRRT.  D/w RN.  He had no UOP over 9/10.  CRRT was started back on 9/9 after brief line holiday (< 24 hours).  UF 2.9L with CRRT yesterday. Remains on epi being weaned. TF up to 50/hr now and tolerating.  Has been on TC overnight.    Intake/Output Summary (Last 24 hours) at 10/22/2021 0919 Last data filed at 10/22/2021 0800 Gross per 24 hour  Intake 2246.91 ml  Output 3676 ml  Net -1429.09 ml     Vitals:  Vitals:   10/22/21 0700 10/22/21 0730 10/22/21 0800 10/22/21 0810  BP:  (!) 104/48    Pulse: 65  66 66  Resp: 16  18 18   Temp:    (!) 96.7 F (35.9 C)  TempSrc:    Axillary  SpO2: 100%  100% 100%  Weight:      Height:         Physical Exam:          General adult male in bed critically ill  HEENT normocephalic atraumatic   Neck trach in place on TC Lungs coarse mechanical breath sounds, no rales Heart S1S2 Abdomen soft nontender nondistended Extremities no pitting edema lower legs - some edema in hips   Neuro - sedation currently running  Access: RIJ nontunneled catheter    Medications reviewed   Labs:     Latest Ref Rng & Units 10/22/2021    4:06 AM 10/21/2021    4:59 PM 10/21/2021    4:03 AM  BMP  Glucose 70 - 99 mg/dL 205  159  169   BUN 8 - 23 mg/dL 21  20  25    Creatinine 0.61 - 1.24 mg/dL 1.33  1.44  1.67   Sodium 135 - 145 mmol/L 136  137  137   Potassium 3.5 - 5.1 mmol/L 3.8  4.3  4.3   Chloride 98 - 111 mmol/L 104  104  102   CO2 22 - 32 mmol/L 24  24  22    Calcium 8.9 - 10.3 mg/dL 8.2  8.4  8.3      Assessment/Plan:   Pt is a 61 y.o. yo male  with history of HTN, HLD, DM, A-fib, stroke, CKD, CAD multivessel dz status post CABG on 8/3 presented with shortness of breath, found to have cardiac tamponade, course complicated by cardiac arrest, use of ECMO, seen as a consultation for the evaluation of AKI and fluid volume  management.   #Acute kidney injury on CKD IIIa, nonoliguric: Ischemic ATN due to cardiogenic shock/cardiac arrest and multiple hemodynamic changes.  Started CRRT on 8/28 for decreasing urine output and fluid/volume management.  Brief (less than 24 hours) line free interval after clotting and CRRT resumed  on 9/9 after nontunneled line with critical care - Continue CRRT  - on 4K Fluids  - keep UF goal at net negative 50 ml/hr  - team would prefer citrate if agent needed for clotting -  No anticoagulation currently and hopeful that new line in IJ will have less issues  - transition to iHD as able. Vasopressor support being weaned.   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  decannulated  with placement of Impella. On colchicine per cardiology S/p closure of the sternum and mediastinal washout on 9/2.   #Cardiogenic shock/cardiac arrest: Was on ECMO before. Continue pressors per primary  team.  Impella out on 9/7    #Acute hypoxic respiratory failure:  improving - now on TC.  optimizing volume with CRRT    #Acute blood loss anemia: Monitor hemoglobin and transfuse as needed by ICU team.  Aranesp 40 mcg weekly on Mondays started on 9/4.  Increased dose to 60 mcg weekly      #Hypophosphatemia due to CRRT, replete sodium phosphate as needed  Disposition - continue ICU monitoring  Justin Mend, MD 10/22/2021 9:19 AM

## 2021-10-22 NOTE — Progress Notes (Signed)
EVENING ROUNDS NOTE :     [address]       [place] [address]             [phone]                 5 Days Post-Op Procedure(s) (LRB): REMOVAL OF IMPELLA LEFT VENTRICULAR ASSIST DEVICE (Right) TRANSESOPHAGEAL ECHOCARDIOGRAM (TEE) (N/A) PERCUTANEOUS TRACHEOSTOMY USING SHILEY FLEXIBLE 8 mm CUFFED TRACH. (N/A)   Total Length of Stay:  LOS: 24 days  Events:   No events TC today    BP (!) 104/48   Pulse (!) 57   Temp (!) 96.3 F (35.7 C) (Axillary)   Resp 14   Ht 5\' 7"  (1.702 m)   Wt 91.7 kg   SpO2 100%   BMI 31.66 kg/m   CVP:  [9 mmHg-16 mmHg] 9 mmHg  Vent Mode: Stand-by FiO2 (%):  [28 %] 28 %    prismasol BGK 4/2.5 500 mL/hr at 10/22/21 1623    prismasol BGK 4/2.5 600 mL/hr at 10/22/21 1159   sodium chloride Stopped (10/17/21 1812)   sodium chloride     albumin human Stopped (10/15/21 1901)   amiodarone 30 mg/hr (10/22/21 1700)   epinephrine 8 mcg/min (10/22/21 1700)   feeding supplement (VITAL 1.5 CAL) 60 mL/hr at 10/22/21 1700   HYDROmorphone 1 mg/hr (10/22/21 1700)   prismasol BGK 4/2.5 1,800 mL/hr at 10/22/21 1642   vasopressin 0.03 Units/min (10/22/21 1700)    I/O last 3 completed shifts: In: 3385.6 [I.V.:1448.3; Other:60; NG/GT:1877.3] Out: 5749 [Emesis/NG output:840; Drains:7; Stool:200; Blood:300]      Latest Ref Rng & Units 10/22/2021    4:06 AM 10/21/2021    4:03 AM 10/20/2021    4:54 AM  CBC  WBC 4.0 - 10.5 K/uL 18.6  18.5  19.9   Hemoglobin 13.0 - 17.0 g/dL 8.5  8.9  8.7   Hematocrit 39.0 - 52.0 % 28.0  28.1  27.4   Platelets 150 - 400 K/uL 497  495  502        Latest Ref Rng & Units 10/22/2021    3:29 PM 10/22/2021    4:06 AM 10/21/2021    4:59 PM  BMP  Glucose 70 - 99 mg/dL 261  205  159   BUN 8 - 23 mg/dL 19  21  20    Creatinine 0.61 - 1.24 mg/dL 1.16  1.33  1.44   Sodium 135 - 145 mmol/L 136  136  137   Potassium 3.5 - 5.1 mmol/L 4.5  3.8  4.3   Chloride 98 - 111 mmol/L 106  104  104   CO2 22 - 32 mmol/L 21   24  24    Calcium 8.9 - 10.3 mg/dL 7.6  8.2  8.4     ABG    Component Value Date/Time   PHART 7.375 10/17/2021 0828   PCO2ART 42.9 10/17/2021 0828   PO2ART 204 (H) 10/17/2021 0828   HCO3 25.0 10/17/2021 0828   TCO2 23 10/17/2021 0832   ACIDBASEDEF 2.0 10/07/2021 1814   O2SAT 65 10/22/2021 0263       Melodie Bouillon, MD 10/22/2021 6:07 PM

## 2021-10-22 NOTE — Progress Notes (Signed)
5 Days Post-Op Procedure(s) (LRB): REMOVAL OF IMPELLA LEFT VENTRICULAR ASSIST DEVICE (Right) TRANSESOPHAGEAL ECHOCARDIOGRAM (TEE) (N/A) PERCUTANEOUS TRACHEOSTOMY USING SHILEY FLEXIBLE 8 mm CUFFED TRACH. (N/A) Subjective:  He has had labile BP with epi up and down. 5-12 mcg. Co-ox 65% this am. CVP 12  Has been on trach collar since yesterday noon.  CRRT continues -1500 cc/24 hrs.  Objective: Vital signs in last 24 hours: Temp:  [96.2 F (35.7 C)-98.2 F (36.8 C)] 96.7 F (35.9 C) (09/12 0810) Pulse Rate:  [60-77] 60 (09/12 1200) Cardiac Rhythm: Normal sinus rhythm (09/12 1200) Resp:  [10-26] 13 (09/12 1200) BP: (104-134)/(48-61) 104/48 (09/12 0730) SpO2:  [95 %-100 %] 100 % (09/12 1200) Arterial Line BP: (89-150)/(45-68) 104/45 (09/12 1200) FiO2 (%):  [28 %] 28 % (09/12 1200) Weight:  [91.7 kg] 91.7 kg (09/12 0645)  Hemodynamic parameters for last 24 hours: CVP:  [11 mmHg-16 mmHg] 11 mmHg  Intake/Output from previous day: 09/11 0701 - 09/12 0700 In: 2275.8 [I.V.:913; NG/GT:1332.8] Out: 3784 [Emesis/NG output:650; Blood:150] Intake/Output this shift: Total I/O In: 839.9 [I.V.:244.6; NG/GT:379.8; IV Piggyback:215.4] Out: 978 [Emesis/NG output:150; Stool:100]  General appearance: looks comfortable on trach collar. Neurologic: follows some commands. Heart: regular rate and rhythm, S1, S2 normal, no murmur Lungs: clear to auscultation bilaterally Abdomen: soft, non-tender; bowel sounds normal Extremities: edema mild Wound: chest dressing intact.  Lab Results: Recent Labs    10/21/21 0403 10/22/21 0406  WBC 18.5* 18.6*  HGB 8.9* 8.5*  HCT 28.1* 28.0*  PLT 495* 497*   BMET:  Recent Labs    10/21/21 1659 10/22/21 0406  NA 137 136  K 4.3 3.8  CL 104 104  CO2 24 24  GLUCOSE 159* 205*  BUN 20 21  CREATININE 1.44* 1.33*  CALCIUM 8.4* 8.2*    PT/INR:  Recent Labs    10/21/21 0403  LABPROT 16.0*  INR 1.3*   ABG    Component Value Date/Time   PHART  7.375 10/17/2021 0828   HCO3 25.0 10/17/2021 0828   TCO2 23 10/17/2021 0832   ACIDBASEDEF 2.0 10/07/2021 1814   O2SAT 65 10/22/2021 0938   CBG (last 3)  Recent Labs    10/22/21 0352 10/22/21 0755 10/22/21 1125  GLUCAP 189* 168* 191*    Assessment/Plan: S/P Procedure(s) (LRB): REMOVAL OF IMPELLA LEFT VENTRICULAR ASSIST DEVICE (Right) TRANSESOPHAGEAL ECHOCARDIOGRAM (TEE) (N/A) PERCUTANEOUS TRACHEOSTOMY USING SHILEY FLEXIBLE 8 mm CUFFED TRACH. (N/A)  BP remains labile on epi. Will add vasopressin which seem to have been most successful at maintaining BP before. Try to wean off epi. He is also on midodrine 15 tid.  Continue trach collar.  CRRT to keep even to negative. He is not ready for iHD with pressor requirement.  Continue tube feeds for nutrition. Advance to goal as tolerated. Feeding tube is in distal duodenum.  Leukocytosis of unclear etiology. No fever but on CRRT.    LOS: 24 days    [name] 10/22/2021

## 2021-10-22 NOTE — Progress Notes (Incomplete)
Around 2240 pt's HR and BP dropped. HR 40, SBP 60s.  Initial interventions: Increased EPI to max of 10 mcg/min paused amio and dilaudid gtt Already on max vaso 0.03 u/min put CRRT fluid removal rate to zero pt laid flat Temp and BG checked (WDL) Suctioned pt's airway  HR/BP remained unchanged, ELINK Dr. Lucile Shutters notified.  [facility] MD interventions: Increased EPI to 17 mcg (wean to original ordered titration range as able) Keep Amio and Dilaudid off Will notify if unable to maintain VS WDL.  HR 46, BP now 92/40(54) at 2342.   _____________________________________  Of note, drop in HR/BP occurred an hour after PM meds given down [place]. However, in the past 3 nights the HR went no lower than 60s and BP was easily maintained on 52mcg/min epi or less (without vaso gtt, which was added back today 9/12 in hopes of weaning off epi).

## 2021-10-22 NOTE — Progress Notes (Signed)
Around 2240 pt's HR and BP acutely dropped,  HR 40, SBP 60s.  Initial interventions: Increased EPI to max of 10 mcg/min paused amio and dilaudid gtt Already on max vaso 0.03 u/min put CRRT fluid removal rate to zero pt laid flat Temp and BG checked (WDL) Suctioned pt's airway  HR/BP remained unchanged, ELINK Dr. Lucile Shutters notified.  [facility] MD interventions: Increased EPI to 17 mcg (wean to original ordered titration range as able) Keep Amio and Dilaudid off Will notify if unable to maintain VS WDL.  2357: requested Cards MD to see pt / POCUS to r/o tamponade. No evidence of tamponade. 12-lead confirmed junctional rhythm. Pt was in SR/SB prior, so rhythm change seems to be likely cause of hypotension. Per Cards and [facility], will utilize levo gtt if unable to maintain BP on current gtts, and will keep amio and dilaudid gtts off.  Henreitta Leber, RN

## 2021-10-22 NOTE — Evaluation (Signed)
[facility] - Evaluation Patient Details  Name: [name] MRN: [id number] Date of Birth: 06-27-1960  Today's Date: 10/22/2021 Time: 7412-8786 SLP Time Calculation (min) (ACUTE ONLY): 14 min  Past Medical History:  Past Medical History:  Diagnosis Date   AKI (acute kidney injury) ([place])    pt unaware of this   Anginal pain ([place])    CAD (coronary artery disease)    a. 03/2015 NSTEMI: LHC with severe 3V CAD  (70% mid RCA, 95% OM1, 90% distal LCx, 90% OM3, 80% prox LAD and 90% ost D1) s/p DES to mLAD w/ small dissction Rx with DES, staged ost Ramus PCI/DES and dLCx s/p PCI/DES    Chest pain 12/24/2020   Diabetes mellitus type 2 in obese [facility])    Diverticulosis    Dyspnea    Dyspnea on exertion 03/16/2015   Dyspnea on exertion   Family history of adverse reaction to anesthesia    patient father- pt states after anesthesia his father "developed dementia"   GERD (gastroesophageal reflux disease)    Hypercholesteremia    Hypertension associated with diabetes ([place]) 03/16/2015   hypertension   NSTEMI (non-ST elevated myocardial infarction) ([place]) 03/17/2015   Obesity    Stroke ([place]) 2022   pt states he had a "mini stroke" during cardiac catheterization   Tobacco abuse    Past Surgical History:  Past Surgical History:  Procedure Laterality Date   APPLICATION OF WOUND VAC N/A 10/03/2021   Procedure: APPLICATION OF WOUND VAC;  Surgeon: Dahlia Byes, MD;  Location: [place];  Service: Thoracic;  Laterality: N/A;   CANNULATION FOR ECMO (EXTRACORPOREAL MEMBRANE OXYGENATION) N/A 09/29/2021   Procedure: CANNULATION FOR ECMO (EXTRACORPOREAL MEMBRANE OXYGENATION);  Surgeon: Lajuana Matte, MD;  Location: [place];  Service: Open Heart Surgery;  Laterality: N/A;   CARDIAC CATHETERIZATION N/A 03/19/2015   Procedure: Left Heart Cath and Coronary Angiography;  Surgeon: Lorretta Harp, MD;  Location: [place] CV LAB;  Service: Cardiovascular;  Laterality: N/A;   CARDIAC  CATHETERIZATION N/A 03/20/2015   Procedure: Coronary Stent Intervention;  Surgeon: Lorretta Harp, MD;  Location: [place] CV LAB;  Service: Cardiovascular;  Laterality: N/A;   CARDIAC CATHETERIZATION N/A 03/22/2015   Procedure: Coronary Stent Intervention;  Surgeon: Lorretta Harp, MD;  Location: [place] CV LAB;  Service: Cardiovascular;  Laterality: N/A;   CORONARY ARTERY BYPASS GRAFT N/A 09/12/2021   Procedure: CORONARY ARTERY BYPASS GRAFTING (CABG) X 5 USING LEFT INTERNAL MAMMARY ARTERY AND ENDOSCOPICALLY HARVESTED RIGHT GREATER SAPHENOUS VEIN.;  Surgeon: [name], MD;  Location: [place];  Service: Open Heart Surgery;  Laterality: N/A;   EXPLORATION POST OPERATIVE OPEN HEART N/A 09/30/2021   Procedure: EXPLORATION POST OPERATIVE OPEN HEART WASHOUT;  Surgeon: Lajuana Matte, MD;  Location: [place];  Service: Open Heart Surgery;  Laterality: N/A;   LEFT HEART CATH AND CORONARY ANGIOGRAPHY N/A 12/25/2020   Procedure: LEFT HEART CATH AND CORONARY ANGIOGRAPHY;  Surgeon: Troy Sine, MD;  Location: [place] CV LAB;  Service: Cardiovascular;  Laterality: N/A;   LEFT HEART CATH AND CORONARY ANGIOGRAPHY N/A 12/26/2020   Procedure: LEFT HEART CATH AND CORONARY ANGIOGRAPHY;  Surgeon: Troy Sine, MD;  Location: [place] CV LAB;  Service: Cardiovascular;  Laterality: N/A;   MEDIASTINAL EXPLORATION N/A 10/03/2021   Procedure: MEDIASTINAL WASHOUT;  Surgeon: Dahlia Byes, MD;  Location: [place];  Service: Thoracic;  Laterality: N/A;  PUMP STANDBY   MEDIASTINAL EXPLORATION N/A 10/12/2021   Procedure:  MEDIASTINAL WASHOUT;  Surgeon: [name], MD;  Location: [place];  Service: Thoracic;  Laterality: N/A;   PERCUTANEOUS TRACHEOSTOMY N/A 10/17/2021   Procedure: PERCUTANEOUS TRACHEOSTOMY USING SHILEY FLEXIBLE 8 mm CUFFED TRACH.;  Surgeon: Candee Furbish, MD;  Location: [place];  Service: Pulmonary;  Laterality: N/A;  Percutaneous tracheostomy   PERICARDIAL WINDOW Right 09/28/2021    Procedure: PERICARDIAL WINDOW;  Surgeon: Lajuana Matte, MD;  Location: [place];  Service: Thoracic;  Laterality: Right;  Right VATS.  Lazy lateral.  double lumen ET tube   PLACEMENT OF IMPELLA LEFT VENTRICULAR ASSIST DEVICE N/A 10/07/2021   Procedure: INSERTION OF RIGHT AXILLARY IMPELLA, DECANNULATION OF ECMO, AND MEDIASTINAL WASHOUT;  Surgeon: [name], MD;  Location: [place];  Service: Open Heart Surgery;  Laterality: N/A;  Open right axillary with 8 mm Hemashield Platinum Vascular graft.   REMOVAL OF IMPELLA LEFT VENTRICULAR ASSIST DEVICE Right 10/17/2021   Procedure: REMOVAL OF IMPELLA LEFT VENTRICULAR ASSIST DEVICE;  Surgeon: [name], MD;  Location: [place];  Service: Open Heart Surgery;  Laterality: Right;   STERNAL CLOSURE N/A 10/12/2021   Procedure: STERNAL CLOSURE;  Surgeon: [name], MD;  [facility]: [facility];  Service: Thoracic;  Laterality: N/A;   TEE WITHOUT CARDIOVERSION N/A 09/12/2021   Procedure: TRANSESOPHAGEAL ECHOCARDIOGRAM (TEE);  Surgeon: [name], MD;  Location: [place];  Service: Open Heart Surgery;  Laterality: N/A;   TEE WITHOUT CARDIOVERSION N/A 09/29/2021   Procedure: TRANSESOPHAGEAL ECHOCARDIOGRAM (TEE);  Surgeon: Lajuana Matte, MD;  Location: [place];  Service: Open Heart Surgery;  Laterality: N/A;   TEE WITHOUT CARDIOVERSION N/A 10/03/2021   Procedure: TRANSESOPHAGEAL ECHOCARDIOGRAM (TEE);  Surgeon: Dahlia Byes, MD;  Location: [place];  Service: Thoracic;  Laterality: N/A;   TEE WITHOUT CARDIOVERSION  10/07/2021   Procedure: TRANSESOPHAGEAL ECHOCARDIOGRAM (TEE);  Surgeon: [name], MD;  Location: [facility] OR;  Service: Open Heart Surgery;;   TEE WITHOUT CARDIOVERSION N/A 10/12/2021   Procedure: TRANSESOPHAGEAL ECHOCARDIOGRAM (TEE);  Surgeon: [name], MD;  Location: [facility] OR;  Service: Thoracic;  Laterality: N/A;   TEE WITHOUT CARDIOVERSION N/A 10/17/2021   Procedure: TRANSESOPHAGEAL ECHOCARDIOGRAM (TEE);  Surgeon: [name], MD;  Location:  [place];  Service: Open Heart Surgery;  Laterality: N/A;   TESTICLE SURGERY  1970   pt states testicle was ascended and had to be "pulled down"   TONSILLECTOMY     as a child   HPI:  61 yo male admitted 8/19 with SOB, bradycardia and hypotension. Pt with bil pleural effusion and pericardial effusion s/p VATS with pericardial window 8/19. 8/20 cardiac tamponade with arrest,  intubated with bedside mediastinal exploration and ECMO via fem access. OR 8/21 for reexploration due to mediastinal hemorrhage. 8/28 OR for washout with ECMO decannulation and Impella placed. 8/29 brief PEA arrest with DCCV to NSR. 9/2 chest closure. 9/7 trach and Impella removed.9/11 VAC removed. PMHx: CAD s/p recent CABG x5 (admitted  8/3-11/23 with post op Afib), CKD stage II, DM, HTN, AKI, Afib and CVA    Assessment / Plan / Recommendation  Clinical Impression  Pt is lethargic this afternoon but does open his eyes and respond to SLP, mostly at the word level. He is a little perseverative and inconsistently follows commands. PMV was donned for several one-minute intervals, with pt being cued to cough and expectorate secretions, ultimately bringing them up into his oral cavity x1. Phonation is noted but with low vocal intensity and fairly significant  dysphonia. PMV was not left in place for long as pt was having difficulty maintaining an alert state, but anticipate that he will be able to tolerate it well with ongoing trials, and use it more effectively with improved alertness. For now, will use it with SLP only. SLP Visit Diagnosis: Aphonia (R49.1)    SLP Assessment  Patient needs continued Speech [place] [facility]    Recommendations for follow up therapy are one component of a multi-disciplinary discharge planning process, led by the attending physician.  Recommendations may be updated based on patient status, additional functional criteria and insurance authorization.  Follow Up Recommendations  Acute  inpatient rehab (3hours/day)    Assistance Recommended at Discharge Frequent or constant Supervision/Assistance  Functional Status Assessment Patient has had a recent decline in their functional status and demonstrates the ability to make significant improvements in function in a reasonable and predictable amount of time.  Frequency and Duration min 2x/week  2 weeks    PMSV Trial PMSV was placed for: 1 min intervals Able to redirect subglottic air through upper airway: Yes Able to Attain Phonation: Yes Voice Quality: Hoarse;Low vocal intensity Able to Expectorate Secretions: Yes Level of Secretion Expectoration with PMSV: Oral Breath Support for Phonation: Moderately decreased Intelligibility: Intelligibility reduced Word: 50-74% accurate Respirations During Trial: 14 SpO2 During Trial: 100 % Pulse During Trial: 59 Behavior: Lethargic;Responsive to questions   Tracheostomy Tube       Vent Dependency  FiO2 (%): 28 %    Cuff Deflation Trial Tolerated Cuff Deflation:  (deflated at baseline)         Osie Bond., M.A. [place] Office [phone]  Secure chat preferred  10/22/2021, 3:22 PM

## 2021-10-22 NOTE — Progress Notes (Addendum)
Patient ID: [name], male   DOB: [birth year], 61 y.o.   MRN: [id number]   Advanced Heart Failure Rounding Note   Subjective:    - 8/19 Pericardial window - 8/20 Cardiac arrest with tamponade -> Emergent bedside washout - 09/29/21 VA Cannulation - 09/30/21 Return to OR for mediastinal hemorrhage - 10/01/21 Developed AF -> amio - 10/02/21 TEE EF 25-30%  - 10/04/21 OR for washout. C/b continued bleeding overnight - 10/07/21 Placement of Impella 5.5 with washout, VA ECMO decannulation. Hypotensive with development of severe RV dysfunction after ECMO off and pressors titrated up. Multiple units of blood products. - 10/08/21 Brief PEA arrest. AFL with RVR >> S/p DCCV to SR, back in AFL shortly after - 8/31 Give 1UPRBCs  - 9/2 OR for chest closure - 9/3 Hypotensive overnight w/ SBPs in 80s. Febrile, mTemp 100.8. CRRT paused. VP increased to 0.04.  - 9/4 s/p bronchoscopy w/ BAL by PCCM, Cx NGTD  - 9/5 vomiting w/ large volume NGT output + watery/foul diarrhea, Tube feeds held. No signs of ileus on KUB. C-diff negative   - 9/7 OR for Impella Extraction and percutaneous tracheostomy. 2 u RBCs. - 9/8 CVVH stopped and line removed for holiday - 9/9 CVVH restarted.    No CO-OX this am>>checking  Epi up to 9 d/t low MAP overnight with pain meds (significant sacral wound pain). Systolic pressures have been consistently > 101, diastolics 75Z-02H. On midodrine 10 TID.  CVP 12. ~3L volume removed with CRRT yesterday, -1.5L for day.  NSR on amio gtt at 30/hr   Hgb 7.8>8.8>9.4>9.2>8.7>8.9>8.5  Tolerating tube feeds better, up to 60/hr this am.  Awake and following commands   Echo 08/28: LV EF 55-60% with severe LVH, small ventricular cavity, RV appears to have near normal systolic function   Objective:     Vital Signs:   Temp:  [96.2 F (35.7 C)-98.2 F (36.8 C)] 96.2 F (35.7 C) (09/12 0345) Pulse Rate:  [60-77] 65 (09/12 0700) Resp:  [9-26] 16 (09/12 0700) BP: (98-134)/(48-61) 104/48  (09/12 0730) SpO2:  [95 %-100 %] 100 % (09/12 0700) Arterial Line BP: (89-150)/(21-68) 107/51 (09/12 0700) FiO2 (%):  [28 %-40 %] 28 % (09/12 0730) Weight:  [91.7 kg] 91.7 kg (09/12 0645) Last BM Date : 10/21/21  Weight change: Filed Weights   10/20/21 0500 10/21/21 0500 10/22/21 0645  Weight: 93.1 kg 92.4 kg 91.7 kg    Intake/Output:   Intake/Output Summary (Last 24 hours) at 10/22/2021 0736 Last data filed at 10/22/2021 0700 Gross per 24 hour  Intake 2275.81 ml  Output 3784 ml  Net -1508.19 ml    PHYSICAL EXAM: General:  Awake, following commands HEENT: + trach and NG Neck: supple. CVP 10-12. Carotids 2+ bilat; no bruits.  Cor: PMI nondisplaced. Regular rate & rhythm. No rubs, gallops or murmurs. Sternum closed. Lungs: clear Abdomen: soft, nontender, nondistended, + BS Extremities: no cyanosis, clubbing, rash, trace edema, + foot drop Neuro: Awake and following commands   Telemetry: SR 60s   Labs: Basic Metabolic Panel: Recent Labs  Lab 10/18/21 0331 10/18/21 1555 10/19/21 0441 10/19/21 1610 10/20/21 0454 10/20/21 1640 10/21/21 0403 10/21/21 1659 10/22/21 0406  NA 136   < > 133*   < > 135 137 137 137 136  K 4.0   < > 4.0   < > 4.0 4.1 4.3 4.3 3.8  CL 103   < > 100   < > 101 105 102 104 104  CO2 23   < >  21*   < > 21* _0 GLUCOSE 152*   < > 196*   < > 135* 179* 169* 159* 205*  BUN 24*   < > 47*   < > 33* 28* 25* 20 21  CREATININE 1.79*   < > 3.53*   < > 2.55* 1.86* 1.67* 1.44* 1.33*  CALCIUM 7.8*   < > 8.5*   < > 8.4* 8.5* 8.3* 8.4* 8.2*  MG 2.3  --  2.6*  --  2.5*  --  2.6*  --  2.4  PHOS 2.8   < > 3.2   < > 2.7 2.6 3.1 1.8* 1.5*   < > = values in this interval not displayed.    Liver Function Tests: Recent Labs  Lab 10/20/21 0454 10/20/21 1640 10/21/21 0403 10/21/21 1659 10/22/21 0406  ALBUMIN 2.4* 2.5* 2.5* 2.4* 2.4*   No results for input(s): "LIPASE", "AMYLASE" in the last 168 hours. No results for input(s): "AMMONIA" in the last  168 hours.  CBC: Recent Labs  Lab 10/18/21 1555 10/19/21 0441 10/20/21 0454 10/21/21 0403 10/22/21 0406  WBC 19.1* 20.1* 19.9* 18.5* 18.6*  HGB 9.1* 9.2* 8.7* 8.9* 8.5*  HCT 28.4* 28.6* 27.4* 28.1* 28.0*  MCV 92.5 93.2 92.3 91.8 94.6  PLT 528* 533* 502* 495* 497*    Cardiac Enzymes: No results for input(s): "CKTOTAL", "CKMB", "CKMBINDEX", "TROPONINI" in the last 168 hours.  BNP: BNP (last 3 results) Recent Labs    12/24/20 0651 09/28/21 0405  BNP 412.0* 826.6*    ProBNP (last 3 results) No results for input(s): "PROBNP" in the last 8760 hours.    Other results:  Imaging: DG Abd 1 View  Result Date: 10/21/2021 CLINICAL DATA:  Feeding tube placement. EXAM: ABDOMEN - 1 VIEW COMPARISON:  None Available. FINDINGS: The bowel gas pattern is normal. Distal tip of nasogastric tube is seen in expected position of proximal stomach. Distal tip of feeding tube is seen in second portion of duodenum. No radio-opaque calculi or other significant radiographic abnormality are seen. IMPRESSION: Distal tip of nasogastric tube seen in expected position of proximal stomach. Distal tip of feeding tube seen in expected position of second portion of duodenum. Electronically Signed   By: Marijo Conception M.D.   On: 10/21/2021 08:37   DG Chest Port 1 View  Result Date: 10/21/2021 CLINICAL DATA:  Status post CABG EXAM: PORTABLE CHEST 1 VIEW COMPARISON:  10/19/2021 FINDINGS: There is a right IJ catheter with tip in the projection of the SVC. Stable tracheostomy tube. Feeding tube and NG tube are stable with tips below the level of the hemidiaphragms. Interval decrease in left pleural effusion with improved aeration to the left lung base. IMPRESSION: Decrease in left pleural effusion with improved aeration to the left lung base. Electronically Signed   By: Kerby Moors M.D.   On: 10/21/2021 06:50     Medications:     Scheduled Medications:  sodium chloride   Intravenous Once   aspirin  81 mg  Per Tube Daily   atorvastatin  80 mg Per Tube Daily   Chlorhexidine Gluconate Cloth  6 each Topical Daily   clonazePAM  1 mg Per Tube Q8H   collagenase   Topical Daily   darbepoetin (ARANESP) injection - NON-DIALYSIS  60 mcg Subcutaneous Q Mon-1800   feeding supplement (PROSource TF20)  60 mL Per Tube TID   folic acid  1 mg Per Tube Daily   heparin injection (subcutaneous)  5,000  Units Subcutaneous Q8H   insulin aspart  0-15 Units Subcutaneous Q4H   midodrine  10 mg Per Tube TID WC   multivitamin  1 tablet Per Tube QHS   mouth rinse  15 mL Mouth Rinse 4 times per day   oxyCODONE  20 mg Per Tube Q6H   pantoprazole (PROTONIX) IV  40 mg Intravenous Q12H   QUEtiapine  50 mg Per Tube BID   sennosides  5 mL Per Tube QHS   sodium chloride flush  10-40 mL Intracatheter Q12H   sodium chloride flush  3 mL Intravenous Q12H    Infusions:   prismasol BGK 4/2.5 500 mL/hr at 10/22/21 0527    prismasol BGK 4/2.5 600 mL/hr at 10/22/21 0300   sodium chloride Stopped (10/17/21 1812)   sodium chloride     albumin human Stopped (10/15/21 1901)   amiodarone 30 mg/hr (10/22/21 0700)   epinephrine 9 mcg/min (10/22/21 0700)   feeding supplement (VITAL 1.5 CAL) 50 mL/hr at 10/22/21 0700   HYDROmorphone 1.5 mg/hr (10/22/21 0700)   prismasol BGK 4/2.5 1,800 mL/hr at 10/22/21 0348   vasopressin Stopped (10/19/21 1825)    PRN Medications: sodium chloride, albumin human, camphor-menthol, dextrose, diphenhydrAMINE, heparin, hydrALAZINE, HYDROmorphone, ipratropium-albuterol, midazolam, ondansetron (ZOFRAN) IV, mouth rinse, polyvinyl alcohol, sodium chloride flush, sodium chloride flush   Assessment/Plan:    1. Shock - mixed cardiogenic/hemorrhagic -> VA ECMO -> decannulated on 8/28 to Impella 5.5 - Echo 08/28: Underfilled LV, EF 55-60% with severe LVH and near normal RV systolic function.  - Impella extracted 9/7 - Currently on epinephrine at 9 mcg. Check CO-OX.  Should be able to slowly wean epinephrine.  Changed parameters for target SBP > 100. - Increase midodrine to 15 TID - CVVH restarted, CVP 12.  Can continue CVVH net UF 50 cc/hr today, eventual transition to iHD for volume management.    2. Cardiac arrest (PEA/bradycardic) - 8/19 in setting of tamponade - PEA arrest again on 08/28  3. Cardiac tamponade with emergent bedside sternotomy  - Diffuse epicardial bleeding with post-op Dresslers - return to OR 8/21 and 8/24 for washouts.  - Washout 8/28 in OR. Multiple units of blood products in OR.   - Chest closed on 9/2 - Hgb now stable.  4. Acute hypoxemic respiratory failure - Off ECMO.  - Vent support per CCM - Perc Trach placed 9/7  - He has completed course of Vanc + meropenem.  - s/p bronchoscopy w/ BAL 9/4. Cx NGTD   5. AKI due to ATN - CRRT started 08/28.  - CVVH today with net UF 50 cc/hr.  Eventual transition to iHD.    6. 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 Now off colchicine with CRRT.  7. 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 Continue aspirin 81 mg daily  8. DM2 - continue SSI   9. PAF/AFL - Tolerates poorly.  - Recurrent AFL. S/p DCCV  to SR 08/29.  - SR today.   - Continue amio gtt, eventually switch to po once off pressors - Not on [facility] for now d/t bleeding, DVT dose heparin. Hgb now stable.  10. ID - Completed vancomycin/meropenem with open chest.   11. Neuro -off sedation, following commands   12. FEN - TFs ongoing  - Tolerating better today  13. Hyperkalemia - resolved with RRT.   14. GI  - vomiting w/ large volume NGT output + watery/foul diarrhea 9/5. KUB negative for ileus - C-diff negative  - now  off Reglan, improving     FINCH, LINDSAY N, PA-C  7:36 AM  Agree with above.   Remains on vent through trach. Epi up to 8 overnight now down to 6. Co-ox 65% CVP 12-13 on CVVHD pulling - 50. Remains in NSR. Following commands but still confused at times. TFs at goal.   General:  Lying in bed on vent  through trach HEENT: normal + cor-trak Neck: supple. RIJ HD cath  Carotids 2+ bilat; no bruits. No lymphadenopathy or thryomegaly appreciated. Cor: PMI nondisplaced. Regular rate & rhythm. Chest wound ok  Lungs: clear Abdomen: soft, nontender, nondistended. No hepatosplenomegaly. No bruits or masses. Hypoactive bowel sounds. Extremities: no cyanosis, clubbing, rash, trace edema Neuro: awake. Follows commands. Confused at times   Continue to wean EPI. Use systolic as 423 as goal given low diastolics. Can switch to DBA as needed. Continue NE.   Continue to pull on CVVHD. Vent wean per CCM. Continue PT/OT  CRITICAL CARE Performed by: Glori Bickers  Total critical care time: 35 minutes  Critical care time was exclusive of separately billable procedures and treating other patients.  Critical care was necessary to treat or prevent imminent or life-threatening deterioration.  Critical care was time spent personally by me (independent of midlevel providers or residents) on the following activities: development of treatment plan with patient and/or surrogate as well as nursing, discussions with consultants, evaluation of patient's response to treatment, examination of patient, obtaining history from patient or surrogate, ordering and performing treatments and interventions, ordering and review of laboratory studies, ordering and review of radiographic studies, pulse oximetry and re-evaluation of patient's condition.  Glori Bickers, MD  1:26 PM

## 2021-10-22 NOTE — Progress Notes (Signed)
NAME:  [name], MRN:  [id number], DOB:  Jul 04, 1960, LOS: 24 ADMISSION DATE:  09/28/2021, CONSULTATION DATE:  8/20 REFERRING MD:  Kipp Brood, CHIEF COMPLAINT:  dyspnea   History of Present Illness:  61 y/o male underwent CABG on 8/3, readmitted on 8/19 in setting of dyspnea with a pericardial effusion requiring pericardial window via bedside sternotomy.  Has had pleural effusions, developed PEA arrest on 8/20, briefly required VA ECMO which was discontinued on 8/29, impella placed, started on CRRT.  Hypoxemia, repeat cardiac arrest on 8/30.    Pertinent  Medical History  CAD s/p CABG 8/3 DM2 Diverticulosis GERD Hyperlipidemia Hypertension History of stroke Tobacco Abuse  Significant Hospital Events: Including procedures, antibiotic start and stop dates in addition to other pertinent events   8/19 admitted, s/p pericardial window 8/20 PEA cardiac arrest, left pigtail chest tube placement, PEA cardiac arrest due to tamponade, bedside sternotomy with pericardial drains placed. 8/21 return to OR for overnight bleeding.  8/22 minimal chest tube output.  Atrial fibrillation, controled with amiodarone.  8/22 TEE showed EF 35% at baseline, which improved significantly with decreasing ECMO flow.  8/23 tolerated diuresis  8/24 mediastinal washout, removal of hematoma 8/25 ongoing bleeding issues from posterior mediastinum 8/29 decannulated and off ECMO, 5.5 Impella placed, L femoral HD cath placed and started on CRRT 8/30 hypoxia and hypotension followed by brief arrest, gentle L lateral chest compressions performed and ROSC in roughly 1 minute. 8/31 remains deeply sedated on ventilator with open chest.  No acute events overnight 9/1 no acute events overnight, remains on ventilator deep sedation.  Tolerating aggressive volume removal per CRRT 9/2 sternotomy closure 9/3 wean versed 9/4 FOB, low grade temps, notable thick mucus/frequent suctioning 9/5 Vomiting, ?mucus plugging. PSV 5-6  hours on 10/5 9/6 impella down to p3 9/7 impella removal and trach. PRBC and DDAVP in OR  9/8 on low dose epi, weaned on 8/8 9/9 RIJ HD catheter placed 9/11 started on ATC at 28% and tolerating well 9/12 remains on 28% ATC  Interim History / Subjective:  TF changed and increased to 50, tolerating well. Tolerated ATC all night at 28%.  Objective   Blood pressure (!) 104/48, pulse 65, temperature (!) 96.2 F (35.7 C), temperature source Axillary, resp. rate 16, height 5\' 7"  (1.702 m), weight 91.7 kg, SpO2 100 %. CVP:  [11 mmHg-16 mmHg] 12 mmHg  Vent Mode: Stand-by FiO2 (%):  [28 %-40 %] 28 % PEEP:  [8 cmH20] 8 cmH20 Pressure Support:  [5 cmH20] 5 cmH20 Plateau Pressure:  [15 cmH20] 15 cmH20   Intake/Output Summary (Last 24 hours) at 10/22/2021 0757 Last data filed at 10/22/2021 0700 Gross per 24 hour  Intake 2275.81 ml  Output 3784 ml  Net -1508.19 ml    Filed Weights   10/20/21 0500 10/21/21 0500 10/22/21 0645  Weight: 93.1 kg 92.4 kg 91.7 kg    Examination:  General: chronically ill appearing man lying in bed in NAD Neuro: Sleepy but arouses to voice HEENT: [place]/AT, eyes anicteric CV:  RRR, no M/R/G PULM: CTAB, breathing comfortably on MV GI: soft, NT Extremities: Minimal edema, no cyanosis Skin: Warm, dry. Mild rash on upper trunk- maculopapular.   Resolved Hospital Problem list   Acute RV failure > resolved  Assessment & Plan:   Acute metabolic encephalopathy, improving  -dilaudid gtt + oxycodone -versed PRN, seroquel scheduled, klonopin scheduled but will lower dose to avoid oversedation -goal RASS 0 -PT, OT, maximize as able -delirium precautions  Hemorrhagic pericarditis with  tamponade PEA arrest due to tamponade, s/p emergent sternotomy Cardiogenic & hemorrhagic shock, s/p VA ECMO (decannulated 8/29), s/p Impella (removed 9/7) CAD s/p CABG x3 Afib  -epi PRN for goal MAP >65, hope to come off after midodrine dose increase 9/11 -continue midodrine, amio,  statin, ASA -per TCTS  Acute respiratory failure with hypoxemia and hypercarbia S/p Tracheostomy  -LTVV -VAP prevention protocol -PAD protocol for sedation -vent weaning  -routine trach care; sutures can come out on 9/14 -still needs CPT & nebs; he gets unstable with mucus plugs  AKI with anuria  Hypophosphatemia -con't CRRT - 97mmol Kphos x 1 -renally dose meds, avoid nephrotoxic meds -strict I/Os  ABLA, previous hemorrhagic shock Thrombocytosis  -transfuse for Hb <7 or hemodynamically significant bleeding  Maculopapular rash- only new thing is banatrol. No history of food allergies. Banatrol stopped 9/10. -continue Benadryl PRN -monitor rash -monitor for other med reactions  Vomiting Diarrhea, improving -NGT to LIWS -Continue TFs via cortrak  DM II -SSI -goal BG 140-180  At Risk Malnutrition  -con't TF via cortrak  Lines/Tubes/Drains -FMS (8/25)  -trach (9/7) -L Fem HD cath (8/28- 9/8) -LIJ CVC (8/20)  -L rad art line (8/19)  -RIJ HD catheter (9/9>)  Best Practice (right click and "Reselect all SmartList Selections" daily)  Diet/type: tubefeeds  DVT prophylaxis: prophylactic heparin  GI prophylaxis: PPI Lines: Central line and Dialysis Catheter Foley:  N/A Code Status:  full code Last date of multidisciplinary goals of care discussion [per primary]  CC time: 30 min.   Montey Hora, [facility] For pager details, please see AMION or use Epic chat  After 1900, please call [facility] for cross coverage needs 10/22/2021, 7:57 AM

## 2021-10-22 NOTE — Progress Notes (Signed)
[place] notified of BG 190, goal 140-180. Request SSI coverage. Awaiting further orders.

## 2021-10-23 ENCOUNTER — Inpatient Hospital Stay (HOSPITAL_COMMUNITY): Payer: Self-pay

## 2021-10-23 ENCOUNTER — Ambulatory Visit: Payer: Self-pay | Admitting: Surgery

## 2021-10-23 DIAGNOSIS — I509 Heart failure, unspecified: Secondary | ICD-10-CM

## 2021-10-23 LAB — POCT I-STAT 7, (LYTES, BLD GAS, ICA,H+H)
Acid-base deficit: 7 mmol/L — ABNORMAL HIGH (ref 0.0–2.0)
Bicarbonate: 19.3 mmol/L — ABNORMAL LOW (ref 20.0–28.0)
Calcium, Ion: 1.14 mmol/L — ABNORMAL LOW (ref 1.15–1.40)
HCT: 27 % — ABNORMAL LOW (ref 39.0–52.0)
Hemoglobin: 9.2 g/dL — ABNORMAL LOW (ref 13.0–17.0)
O2 Saturation: 94 %
Patient temperature: 96.6
Potassium: 4.4 mmol/L (ref 3.5–5.1)
Sodium: 136 mmol/L (ref 135–145)
TCO2: 20 mmol/L — ABNORMAL LOW (ref 22–32)
pCO2 arterial: 38.5 mmHg (ref 32–48)
pH, Arterial: 7.301 — ABNORMAL LOW (ref 7.35–7.45)
pO2, Arterial: 73 mmHg — ABNORMAL LOW (ref 83–108)

## 2021-10-23 LAB — COOXEMETRY PANEL
Carboxyhemoglobin: 0.5 % (ref 0.5–1.5)
Carboxyhemoglobin: 1.8 % — ABNORMAL HIGH (ref 0.5–1.5)
Methemoglobin: 1.8 % — ABNORMAL HIGH (ref 0.0–1.5)
Methemoglobin: <0.7 % (ref 0.0–1.5)
O2 Saturation: 36.3 %
O2 Saturation: 51.9 %
Total hemoglobin: 8.9 g/dL — ABNORMAL LOW (ref 12.0–16.0)
Total hemoglobin: 8.6 g/dL — ABNORMAL LOW (ref 12.0–16.0)

## 2021-10-23 LAB — CBC
HCT: 27.2 % — ABNORMAL LOW (ref 39.0–52.0)
Hemoglobin: 8.5 g/dL — ABNORMAL LOW (ref 13.0–17.0)
MCH: 29.7 pg (ref 26.0–34.0)
MCHC: 31.3 g/dL (ref 30.0–36.0)
MCV: 95.1 fL (ref 80.0–100.0)
Platelets: 488 10*3/uL — ABNORMAL HIGH (ref 150–400)
RBC: 2.86 MIL/uL — ABNORMAL LOW (ref 4.22–5.81)
RDW: 17 % — ABNORMAL HIGH (ref 11.5–15.5)
WBC: 23.2 10*3/uL — ABNORMAL HIGH (ref 4.0–10.5)
nRBC: 3.4 % — ABNORMAL HIGH (ref 0.0–0.2)

## 2021-10-23 LAB — MAGNESIUM: Magnesium: 2.4 mg/dL (ref 1.7–2.4)

## 2021-10-23 LAB — RENAL FUNCTION PANEL
Albumin: 2.4 g/dL — ABNORMAL LOW (ref 3.5–5.0)
Albumin: 2.4 g/dL — ABNORMAL LOW (ref 3.5–5.0)
Anion gap: 11 (ref 5–15)
Anion gap: 8 (ref 5–15)
BUN: 22 mg/dL (ref 8–23)
BUN: 21 mg/dL (ref 8–23)
CO2: 20 mmol/L — ABNORMAL LOW (ref 22–32)
CO2: 23 mmol/L (ref 22–32)
Calcium: 7.9 mg/dL — ABNORMAL LOW (ref 8.9–10.3)
Calcium: 7.6 mg/dL — ABNORMAL LOW (ref 8.9–10.3)
Chloride: 105 mmol/L (ref 98–111)
Chloride: 106 mmol/L (ref 98–111)
Creatinine, Ser: 1.38 mg/dL — ABNORMAL HIGH (ref 0.61–1.24)
Creatinine, Ser: 1.3 mg/dL — ABNORMAL HIGH (ref 0.61–1.24)
GFR, Estimated: 58 mL/min — ABNORMAL LOW (ref >60)
GFR, Estimated: >60 mL/min (ref >60)
Glucose, Bld: 285 mg/dL — ABNORMAL HIGH (ref 70–99)
Glucose, Bld: 254 mg/dL — ABNORMAL HIGH (ref 70–99)
Phosphorus: 2.3 mg/dL — ABNORMAL LOW (ref 2.5–4.6)
Phosphorus: 1.5 mg/dL — ABNORMAL LOW (ref 2.5–4.6)
Potassium: 4.9 mmol/L (ref 3.5–5.1)
Potassium: 4.6 mmol/L (ref 3.5–5.1)
Sodium: 136 mmol/L (ref 135–145)
Sodium: 137 mmol/L (ref 135–145)

## 2021-10-23 LAB — GLUCOSE, CAPILLARY
Glucose-Capillary: 144 mg/dL — ABNORMAL HIGH (ref 70–99)
Glucose-Capillary: 189 mg/dL — ABNORMAL HIGH (ref 70–99)
Glucose-Capillary: 210 mg/dL — ABNORMAL HIGH (ref 70–99)
Glucose-Capillary: 235 mg/dL — ABNORMAL HIGH (ref 70–99)
Glucose-Capillary: 236 mg/dL — ABNORMAL HIGH (ref 70–99)
Glucose-Capillary: 271 mg/dL — ABNORMAL HIGH (ref 70–99)

## 2021-10-23 LAB — BASIC METABOLIC PANEL
Anion gap: 8 (ref 5–15)
BUN: 21 mg/dL (ref 8–23)
CO2: 22 mmol/L (ref 22–32)
Calcium: 7.9 mg/dL — ABNORMAL LOW (ref 8.9–10.3)
Chloride: 106 mmol/L (ref 98–111)
Creatinine, Ser: 1.31 mg/dL — ABNORMAL HIGH (ref 0.61–1.24)
GFR, Estimated: >60 mL/min (ref >60)
Glucose, Bld: 195 mg/dL — ABNORMAL HIGH (ref 70–99)
Potassium: 4.3 mmol/L (ref 3.5–5.1)
Sodium: 136 mmol/L (ref 135–145)

## 2021-10-23 LAB — ECHOCARDIOGRAM LIMITED
AV Mean grad: 3 mmHg
AV Peak grad: 6 mmHg
Ao pk vel: 1.22 m/s
Area-P 1/2: 3.23 cm2
Calc EF: 34.2 %
Height: 67 in
P 1/2 time: 792 msec
Single Plane A2C EF: 33.9 %
Single Plane A4C EF: 35 %
Weight: 3231.06 oz

## 2021-10-23 LAB — VITAMIN B1: Vitamin B1 (Thiamine): 165 nmol/L (ref 66.5–200.0)

## 2021-10-23 LAB — PROCALCITONIN: Procalcitonin: 1.63 ng/mL

## 2021-10-23 LAB — VITAMIN B12: Vitamin B-12: 2063 pg/mL — ABNORMAL HIGH (ref 180–914)

## 2021-10-23 LAB — CK: Total CK: 407 U/L — ABNORMAL HIGH (ref 49–397)

## 2021-10-23 LAB — LACTIC ACID, PLASMA: Lactic Acid, Venous: 2.4 mmol/L (ref 0.5–1.9)

## 2021-10-23 MED ORDER — CALCIUM GLUCONATE-NACL 1-0.675 GM/50ML-% IV SOLN
1.0000 g | Freq: Once | INTRAVENOUS | Status: AC
  Administered 2021-10-23: 1000 mg via INTRAVENOUS
  Filled 2021-10-23: qty 50

## 2021-10-23 MED ORDER — PERFLUTREN LIPID MICROSPHERE
1.0000 mL | INTRAVENOUS | Status: AC | PRN
  Administered 2021-10-23: 2 mL via INTRAVENOUS

## 2021-10-23 MED ORDER — SODIUM CHLORIDE 0.9 % IV SOLN
6.0000 mg/kg | Freq: Every day | INTRAVENOUS | Status: DC
  Filled 2021-10-23: qty 11

## 2021-10-23 MED ORDER — VITAMIN C 500 MG PO TABS
250.0000 mg | ORAL_TABLET | Freq: Two times a day (BID) | ORAL | Status: AC
  Administered 2021-10-23 – 2021-11-22 (×56): 250 mg
  Filled 2021-10-23 (×56): qty 1

## 2021-10-23 MED ORDER — SODIUM CHLORIDE 0.9 % IV SOLN
200.0000 mg | INTRAVENOUS | Status: DC
  Administered 2021-10-23 – 2021-10-26 (×4): 200 mg via INTRAVENOUS
  Filled 2021-10-23 (×5): qty 10

## 2021-10-23 MED ORDER — SODIUM CHLORIDE 0.9 % IV SOLN
1.0000 g | Freq: Three times a day (TID) | INTRAVENOUS | Status: DC
  Administered 2021-10-23 – 2021-10-29 (×18): 1 g via INTRAVENOUS
  Filled 2021-10-23 (×19): qty 20

## 2021-10-23 MED ORDER — INSULIN GLARGINE-YFGN 100 UNIT/ML ~~LOC~~ SOLN
10.0000 [IU] | Freq: Every day | SUBCUTANEOUS | Status: DC
  Administered 2021-10-23 – 2021-11-18 (×27): 10 [IU] via SUBCUTANEOUS
  Filled 2021-10-23 (×30): qty 0.1

## 2021-10-23 MED ORDER — POLYETHYLENE GLYCOL 3350 17 G PO PACK
17.0000 g | PACK | Freq: Every day | ORAL | Status: DC
  Administered 2021-10-23 – 2021-10-27 (×4): 17 g
  Filled 2021-10-23 (×4): qty 1

## 2021-10-23 MED ORDER — THIAMINE MONONITRATE 100 MG PO TABS
100.0000 mg | ORAL_TABLET | Freq: Every day | ORAL | Status: DC
  Administered 2021-10-23 – 2021-11-22 (×28): 100 mg
  Filled 2021-10-23 (×29): qty 1

## 2021-10-23 MED ORDER — SODIUM PHOSPHATES 45 MMOLE/15ML IV SOLN
45.0000 mmol | Freq: Once | INTRAVENOUS | Status: AC
  Administered 2021-10-23: 45 mmol via INTRAVENOUS
  Filled 2021-10-23: qty 15

## 2021-10-23 MED ORDER — SODIUM CHLORIDE 0.9 % IV SOLN
10.0000 mg/kg | Freq: Every day | INTRAVENOUS | Status: DC
  Administered 2021-10-23 – 2021-10-26 (×4): 900 mg via INTRAVENOUS
  Filled 2021-10-23 (×5): qty 18

## 2021-10-23 MED ORDER — HYDROMORPHONE HCL 1 MG/ML IJ SOLN: 1.0000 mg | INTRAMUSCULAR | Status: DC | PRN

## 2021-10-23 MED ORDER — PIPERACILLIN-TAZOBACTAM 3.375 G IVPB 30 MIN
3.3750 g | Freq: Four times a day (QID) | INTRAVENOUS | Status: DC
  Filled 2021-10-23: qty 50

## 2021-10-23 MED ORDER — VANCOMYCIN HCL 750 MG/150ML IV SOLN: 750.0000 mg | INTRAVENOUS | Status: DC

## 2021-10-23 MED ORDER — NOREPINEPHRINE 4 MG/250ML-% IV SOLN
0.0000 ug/min | INTRAVENOUS | Status: DC
  Administered 2021-10-25: 4 ug/min via INTRAVENOUS
  Filled 2021-10-23: qty 250

## 2021-10-23 MED ORDER — DEXMEDETOMIDINE HCL IN NACL 400 MCG/100ML IV SOLN
0.0000 ug/kg/h | INTRAVENOUS | Status: AC
  Administered 2021-10-23: 0.8 ug/kg/h via INTRAVENOUS
  Administered 2021-10-23: 0.5 ug/kg/h via INTRAVENOUS
  Administered 2021-10-23: 0.4 ug/kg/h via INTRAVENOUS
  Administered 2021-10-24: 0.5 ug/kg/h via INTRAVENOUS
  Administered 2021-10-24: 0.7 ug/kg/h via INTRAVENOUS
  Administered 2021-10-25: 0.6 ug/kg/h via INTRAVENOUS
  Administered 2021-10-25: 0.2 ug/kg/h via INTRAVENOUS
  Administered 2021-10-26: 0.6 ug/kg/h via INTRAVENOUS
  Filled 2021-10-23 (×8): qty 100

## 2021-10-23 MED ORDER — VANCOMYCIN HCL 1750 MG/350ML IV SOLN
1750.0000 mg | Freq: Once | INTRAVENOUS | Status: DC
  Administered 2021-10-23: 1750 mg via INTRAVENOUS
  Filled 2021-10-23: qty 350

## 2021-10-23 MED ORDER — INSULIN ASPART 100 UNIT/ML IJ SOLN
0.0000 [IU] | INTRAMUSCULAR | Status: DC
  Administered 2021-10-23: 7 [IU] via SUBCUTANEOUS
  Administered 2021-10-23: 3 [IU] via SUBCUTANEOUS
  Administered 2021-10-23: 4 [IU] via SUBCUTANEOUS
  Administered 2021-10-23: 7 [IU] via SUBCUTANEOUS
  Administered 2021-10-24 (×2): 4 [IU] via SUBCUTANEOUS
  Administered 2021-10-24: 3 [IU] via SUBCUTANEOUS
  Administered 2021-10-24: 7 [IU] via SUBCUTANEOUS
  Administered 2021-10-24: 2 [IU] via SUBCUTANEOUS
  Administered 2021-10-25: 4 [IU] via SUBCUTANEOUS
  Administered 2021-10-25: 7 [IU] via SUBCUTANEOUS
  Administered 2021-10-25: 4 [IU] via SUBCUTANEOUS
  Administered 2021-10-25: 3 [IU] via SUBCUTANEOUS
  Administered 2021-10-25: 4 [IU] via SUBCUTANEOUS
  Administered 2021-10-25 – 2021-10-26 (×3): 3 [IU] via SUBCUTANEOUS
  Administered 2021-10-26: 4 [IU] via SUBCUTANEOUS
  Administered 2021-10-26: 7 [IU] via SUBCUTANEOUS
  Administered 2021-10-26 – 2021-10-27 (×3): 4 [IU] via SUBCUTANEOUS
  Administered 2021-10-27 (×2): 3 [IU] via SUBCUTANEOUS
  Administered 2021-10-27 – 2021-10-28 (×4): 4 [IU] via SUBCUTANEOUS
  Administered 2021-10-28: 7 [IU] via SUBCUTANEOUS
  Administered 2021-10-28 – 2021-10-29 (×4): 3 [IU] via SUBCUTANEOUS
  Administered 2021-10-29: 4 [IU] via SUBCUTANEOUS
  Administered 2021-10-29: 3 [IU] via SUBCUTANEOUS
  Administered 2021-10-29: 5 [IU] via SUBCUTANEOUS
  Administered 2021-10-29 (×2): 3 [IU] via SUBCUTANEOUS
  Administered 2021-10-30: 4 [IU] via SUBCUTANEOUS
  Administered 2021-10-30 – 2021-11-01 (×5): 3 [IU] via SUBCUTANEOUS
  Administered 2021-11-01 (×3): 4 [IU] via SUBCUTANEOUS
  Administered 2021-11-01: 3 [IU] via SUBCUTANEOUS
  Administered 2021-11-01: 4 [IU] via SUBCUTANEOUS
  Administered 2021-11-02: 7 [IU] via SUBCUTANEOUS
  Administered 2021-11-02 – 2021-11-03 (×4): 4 [IU] via SUBCUTANEOUS
  Administered 2021-11-03: 7 [IU] via SUBCUTANEOUS
  Administered 2021-11-03 (×3): 4 [IU] via SUBCUTANEOUS
  Administered 2021-11-04: 3 [IU] via SUBCUTANEOUS
  Administered 2021-11-04: 4 [IU] via SUBCUTANEOUS
  Administered 2021-11-04 (×2): 3 [IU] via SUBCUTANEOUS
  Administered 2021-11-04 – 2021-11-05 (×2): 4 [IU] via SUBCUTANEOUS
  Administered 2021-11-05: 7 [IU] via SUBCUTANEOUS
  Administered 2021-11-05: 4 [IU] via SUBCUTANEOUS
  Administered 2021-11-05 – 2021-11-06 (×3): 3 [IU] via SUBCUTANEOUS
  Administered 2021-11-06: 4 [IU] via SUBCUTANEOUS
  Administered 2021-11-06: 3 [IU] via SUBCUTANEOUS
  Administered 2021-11-06 – 2021-11-07 (×4): 4 [IU] via SUBCUTANEOUS
  Administered 2021-11-07: 3 [IU] via SUBCUTANEOUS
  Administered 2021-11-07 – 2021-11-08 (×2): 4 [IU] via SUBCUTANEOUS
  Administered 2021-11-08 (×2): 3 [IU] via SUBCUTANEOUS
  Administered 2021-11-08: 4 [IU] via SUBCUTANEOUS
  Administered 2021-11-09: 3 [IU] via SUBCUTANEOUS
  Administered 2021-11-09 (×2): 4 [IU] via SUBCUTANEOUS
  Administered 2021-11-10: 3 [IU] via SUBCUTANEOUS
  Administered 2021-11-10: 4 [IU] via SUBCUTANEOUS
  Administered 2021-11-10 (×2): 3 [IU] via SUBCUTANEOUS
  Administered 2021-11-11: 4 [IU] via SUBCUTANEOUS
  Administered 2021-11-11 (×2): 3 [IU] via SUBCUTANEOUS
  Administered 2021-11-11: 4 [IU] via SUBCUTANEOUS
  Administered 2021-11-11: 3 [IU] via SUBCUTANEOUS
  Administered 2021-11-12: 4 [IU] via SUBCUTANEOUS
  Administered 2021-11-12: 3 [IU] via SUBCUTANEOUS
  Administered 2021-11-12: 4 [IU] via SUBCUTANEOUS
  Administered 2021-11-12 – 2021-11-13 (×4): 3 [IU] via SUBCUTANEOUS
  Administered 2021-11-13 – 2021-11-14 (×4): 4 [IU] via SUBCUTANEOUS
  Administered 2021-11-14 – 2021-11-27 (×18): 3 [IU] via SUBCUTANEOUS
  Administered 2021-11-28: 4 [IU] via SUBCUTANEOUS

## 2021-10-23 MED ORDER — DOCUSATE SODIUM 50 MG/5ML PO LIQD
100.0000 mg | Freq: Two times a day (BID) | ORAL | Status: DC
  Administered 2021-10-23 – 2021-10-27 (×8): 100 mg
  Filled 2021-10-23 (×10): qty 10

## 2021-10-23 MED ORDER — ZINC SULFATE 220 (50 ZN) MG PO CAPS
220.0000 mg | ORAL_CAPSULE | Freq: Every day | ORAL | Status: AC
  Administered 2021-10-23 – 2021-11-05 (×14): 220 mg
  Filled 2021-10-23 (×14): qty 1

## 2021-10-23 MED ORDER — HYDROMORPHONE HCL 1 MG/ML IJ SOLN
1.0000 mg | INTRAMUSCULAR | Status: DC | PRN
  Administered 2021-10-23 (×2): 2 mg via INTRAVENOUS
  Administered 2021-10-23: 3 mg via INTRAVENOUS
  Administered 2021-10-23: 2 mg via INTRAVENOUS
  Administered 2021-10-24: 3 mg via INTRAVENOUS
  Filled 2021-10-23 (×2): qty 3
  Filled 2021-10-23 (×3): qty 2

## 2021-10-23 MED FILL — Heparin Sodium (Porcine) Inj 1000 Unit/ML: INTRAMUSCULAR | Qty: 10 | Status: AC

## 2021-10-23 MED FILL — Sodium Chloride IV Soln 0.9%: INTRAVENOUS | Qty: 6000 | Status: AC

## 2021-10-23 MED FILL — Heparin Sodium (Porcine) Inj 1000 Unit/ML: INTRAMUSCULAR | Qty: 30 | Status: AC

## 2021-10-23 NOTE — Progress Notes (Signed)
Date and time results received: 10/23/21 1122  Test: Lactic acid  Critical Value: 2.4  Name of Provider Notified: Chand  Orders Received? Or Actions Taken?: None

## 2021-10-23 NOTE — Progress Notes (Signed)
[facility] Progress Note  Name: [name] MRN: [id number] DOB: 1961-01-03   Subjective:  Seen and examined on CRRT.  D/w RN.  He had no UOP over 9/12.   UF 3.4L with CRRT yesterday. Remains on epi being weaned - was off but in junctional rhythm overnight with BP in the 80s so it was resumed.      Intake/Output Summary (Last 24 hours) at 10/23/2021 1018 Last data filed at 10/23/2021 1000 Gross per 24 hour  Intake 3857.3 ml  Output 5116 ml  Net -1258.7 ml     Vitals:  Vitals:   10/23/21 0800 10/23/21 0900 10/23/21 0913 10/23/21 1000  BP:      Pulse: 72 73 74 70  Resp: (!) 24 19 20 18   Temp:      TempSrc:      SpO2: 100% 98% 100% 100%  Weight:      Height:         Physical Exam:          General adult male in bed critically ill looking uncomfortable as opioid has been stopped HEENT normocephalic atraumatic   Neck trach in place on TC Lungs coarse mechanical breath sounds, no rales Heart S1S2 Abdomen soft nontender nondistended Extremities no pitting edema lower legs - some edema in hips   Neuro - sedation currently running  Access: RIJ nontunneled catheter    Medications reviewed   Labs:     Latest Ref Rng & Units 10/23/2021    4:50 AM 10/22/2021   11:54 PM 10/22/2021    3:29 PM  BMP  Glucose 70 - 99 mg/dL 285   261   BUN 8 - 23 mg/dL 22   19   Creatinine 0.61 - 1.24 mg/dL 1.38   1.16   Sodium 135 - 145 mmol/L 136  136  136   Potassium 3.5 - 5.1 mmol/L 4.9  4.4  4.5   Chloride 98 - 111 mmol/L 105   106   CO2 22 - 32 mmol/L 20   21   Calcium 8.9 - 10.3 mg/dL 7.9   7.6      Assessment/Plan:   Pt is a 61 y.o. yo male  with history of HTN, HLD, DM, A-fib, stroke, CKD, CAD multivessel dz status post CABG on 8/3 presented with shortness of breath, found to have cardiac tamponade, course complicated by cardiac arrest, use of ECMO, seen as a consultation for the evaluation of AKI and fluid volume management.   #Acute kidney injury on CKD IIIa,  nonoliguric: Ischemic ATN due to cardiogenic shock/cardiac arrest and multiple hemodynamic changes.  Started CRRT on 8/28 for decreasing urine output and fluid/volume management.  Brief (less than 24 hours) line free interval after clotting and CRRT resumed  on 9/9 after nontunneled line with critical care - Continue CRRT  - on 4K Fluids  - keep UF goal at net negative 50 ml/hr  -- CVP now 9, d/w RN if BPs dipping further run even - team would prefer citrate if agent needed for clotting -  No anticoagulation currently and hopeful that new line in IJ will have less issues  - transition to iHD as able. Vasopressor support being weaned.   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  decannulated  with placement of Impella. On colchicine per cardiology S/p closure of the sternum and mediastinal washout on 9/2.   #Cardiogenic shock/cardiac arrest: Was on ECMO before. Continue pressors per primary  team.  Impella out on 9/7    #Acute hypoxic respiratory failure:  improving - now on TC.  optimizing volume with CRRT    #Acute blood loss anemia: Monitor hemoglobin and transfuse as needed by ICU team.  Aranesp 40 mcg weekly on Mondays started on 9/4.  Increased dose to 60 mcg weekly      #Hypophosphatemia due to CRRT, replete sodium phosphate as needed  Disposition - continue ICU monitoring  Justin Mend, MD 10/23/2021 10:18 AM

## 2021-10-23 NOTE — Progress Notes (Signed)
  Echocardiogram 2D Echocardiogram has been performed.  [name] 10/23/2021, 2:28 PM

## 2021-10-23 NOTE — Progress Notes (Signed)
Patient ID: [name], male   DOB: 1960/04/03, 61 y.o.   MRN: [id number]  TCTS Evening Rounds:  Hemodynamically stable on Epi 8, vaso 0.03.  Started on precedex today but caused some bradycardia and hypotension and now just about off.   Antibiotic coverage broadened to  Merrem, Cubicin, and micafungin. Cultures pending. Arterial line changed this am due to puss draining at site.  Looks calm on trach collar but still on low dose Precedex.  PT worked with him today.  Tube feeds at 60/hr but has a lot of NG output. Feeding tube still looked like it was in duodenum on CXR this am.

## 2021-10-23 NOTE — Progress Notes (Signed)
Nutrition Follow-up  DOCUMENTATION CODES:   Not applicable  INTERVENTION:   Consider re-advancing Cortrak further into small intestine as Cortrak initially located in jejunum and tip is now in the descending duodenum  Tube Feeding via Cortrak (duodenum): Vital 1.5 at 60 ml/hr Pro-source TF20 60 mL TID This provides 2400 kcals, 157 g of protein and 1094 mL of free water  Continue aggressive phosphorus supplementation while on CRRT  Add Folic Acid 1 mg daily, Thiamine 100 mg daily Continue Renal MVI daily Plan to go ahead and supplement Vit C 250 mg BID and Zinc Sulfate 220 mg BID given increased risk for deficiency and non healing wounds. RD can adjust as needed once lab results return  Await further micronutrient labs  NUTRITION DIAGNOSIS:   Inadequate oral intake related to acute illness as evidenced by NPO status.  Being addressed via TF   GOAL:   Patient will meet greater than or equal to 90% of their needs  Progressing  MONITOR:   Vent status, Labs, Weight trends, TF tolerance  REASON FOR ASSESSMENT:   Ventilator    ASSESSMENT:   61 yo male admitted with acute on chronic CHF with recent CABG, symptomatic bradycardia and hypotension. PMH includes DM, CAD s/p recent CABG x 5, CKD 3, CVA  8/19 Pericardial window, evacuation of pericardial and pleural effusion 8/20 Intubated, Bedside mediastinal re-exploration, cardiac tamponade, mediastinal clot; PEA arrest due to tamponade, VA ECMO cannulation 8/21 Return to OR for bleeding, mediastinal re-exploration, Cortrak placed-gastric 8/23 TEE with EF 25-30% 8/25 Cortrak advanced to Post-Pyloric position 8/28 OR: VA ECMO decannulation, Impella placed, mediastinal washout, CRRT initiated 8/29 Brief arrest 9/02 OR: mediastinal washout, sternal closure 9/05 Vomiting large volume, TF held, NG placed to LIS, watery, foul smelling diarrhea, KUB negative for ileus/obstruction, confirmed Cortrak is post pyloric 9/07 OR:  Impella removed, Trach placed 9/08 CRRT held 9/09 CRRT restarted, MD changed to Nepro 9/10 TF off due to intolerance 9/11 Switched back to Nepro with better tolerace  Pt remains on vent support via trach, remains on CRRT (4K fluids), UF goal net negative 50 ml/hr Remains on epinpehrine, vasopressin,   Tolerating Vital 1.5 formula at rate of 60 ml/hr via Cortrak. Cortrak in descending duodenum per abd xray.   RD did not that output from OG tube today did appear to look like bile mixed with TF. Discussed possible advancement to jejunum with MD, MD is agreeable to this plan  Micronutrient Profile:  CRP: 81.8 (H) Folic Acid: 5.2 (L) Thiamine: 165 (wdl) Vitamin A: pending Vitamin C: pending Vitamin D: 15.51  (L) Vitamin B12: pending Copper: pending Zinc: pending  Labs: phosphorus 1.5 (L) Meds: ss novolog, semglee, colace, Rena-vite, senokot, miralax, aranesp  Diet Order:   Diet Order             Diet NPO time specified  Diet effective midnight                   EDUCATION NEEDS:   Not appropriate for education at this time  Skin:  Skin Assessment: Skin Integrity Issues: Skin Integrity Issues:: Unstageable Stage II: R upper buttock, L ischium Unstageable: L upper  buttock, gluteal cleft/distal sacrum Incisions: chest (closed, wound VAC removed) Other: sheer/friction to right scapula region  Last BM:  9/13 rectal tube  Height:   Ht Readings from Last 1 Encounters:  10/23/21 5\' 7"  (1.702 m)    Weight:   Wt Readings from Last 1 Encounters:  10/23/21 91.6 kg  BMI:  Body mass index is 31.63 kg/m.  Estimated Nutritional Needs:   Kcal:  2200-2400 kcals  Protein:  135-165 g  Fluid:  1.8 L   Kerman Passey MS, RDN, LDN, CNSC Registered Dietitian 3 Clinical Nutrition RD Pager and On-Call Pager Number Located in [place]

## 2021-10-23 NOTE — Procedures (Signed)
Arterial Catheter Insertion Procedure Note  [name]  [id number]  Feb 29, 1960  Date:10/23/21  Time:5:17 AM    Provider Performing: Wynelle Beckmann    Procedure: Insertion of Arterial Line [phone]) without US guidance  Indication(s) Blood pressure monitoring and/or need for frequent ABGs  Consent Unable to obtain consent due to emergent nature of procedure.  Anesthesia None   Time Out Verified patient identification, verified procedure, site/side was marked, verified correct patient position, special equipment/implants available, medications/allergies/relevant history reviewed, required imaging and test results available.   Sterile Technique Maximal sterile technique including full sterile barrier drape, hand hygiene, sterile gown, sterile gloves, mask, hair covering, sterile ultrasound probe cover (if used).   Procedure Description Area of catheter insertion was cleaned with chlorhexidine and draped in sterile fashion. Without real-time ultrasound guidance an arterial catheter was placed into the right radial artery.  Appropriate arterial tracings confirmed on monitor.     Complications/Tolerance None; patient tolerated the procedure well.   EBL Minimal   Specimen(s) None

## 2021-10-23 NOTE — Progress Notes (Signed)
6 Days Post-Op Procedure(s) (LRB): REMOVAL OF IMPELLA LEFT VENTRICULAR ASSIST DEVICE (Right) TRANSESOPHAGEAL ECHOCARDIOGRAM (TEE) (N/A) PERCUTANEOUS TRACHEOSTOMY USING SHILEY FLEXIBLE 8 mm CUFFED TRACH. (N/A) Subjective:  Had some junctional bradycardia overnight with hypotension requiring increased epi. Amio stopped. This am he is back in sinus 70's with decreased pressor requirement. Co-ox this am 36, repeat 52.   Objective: Vital signs in last 24 hours: Temp:  [96 F (35.6 C)-98.7 F (37.1 C)] 98.7 F (37.1 C) (09/13 0717) Pulse Rate:  [42-74] 70 (09/13 1000) Cardiac Rhythm: Normal sinus rhythm (09/13 0800) Resp:  [13-27] 18 (09/13 1000) BP: (108)/(44) 108/44 (09/12 2106) SpO2:  [95 %-100 %] 100 % (09/13 1000) Arterial Line BP: (63-137)/(24-75) 137/48 (09/13 0515) FiO2 (%):  [28 %] 28 % (09/13 0800) Weight:  [91.6 kg] 91.6 kg (09/13 0615)  Hemodynamic parameters for last 24 hours: CVP:  [9 mmHg-15 mmHg] 9 mmHg  Intake/Output from previous day: 09/12 0701 - 09/13 0700 In: 3909.5 [I.V.:1374.1; NG/GT:1904.8; IV Piggyback:570.6] Out: 5233 [Emesis/NG output:800; Stool:1000; Blood:40] Intake/Output this shift: Total I/O In: 357.6 [I.V.:137.6; NG/GT:220] Out: 513 [Stool:100]  General appearance: restless, a lot of secretions in trach tube. Neurologic: moving all ext and follows commands Heart: regular rate and rhythm, S1, S2 normal, no murmur Lungs: rhonchi bilaterally Abdomen: soft, non-tender; bowel sounds normal Extremities: edema mild Wound: chest wound intact without drainage. There are a few areas where skin could not be approximated due to necrosis of skin edge but these are dry and should heal in.  Lab Results: Recent Labs    10/22/21 0406 10/22/21 2354 10/23/21 0450  WBC 18.6*  --  23.2*  HGB 8.5* 9.2* 8.5*  HCT 28.0* 27.0* 27.2*  PLT 497*  --  488*   BMET:  Recent Labs    10/22/21 1529 10/22/21 2354 10/23/21 0450  NA 136 136 136  K 4.5 4.4 4.9  CL  106  --  105  CO2 21*  --  20*  GLUCOSE 261*  --  285*  BUN 19  --  22  CREATININE 1.16  --  1.38*  CALCIUM 7.6*  --  7.9*    PT/INR:  Recent Labs    10/21/21 0403  LABPROT 16.0*  INR 1.3*   ABG    Component Value Date/Time   PHART 7.301 (L) 10/22/2021 2354   HCO3 19.3 (L) 10/22/2021 2354   TCO2 20 (L) 10/22/2021 2354   ACIDBASEDEF 7.0 (H) 10/22/2021 2354   O2SAT 51.9 10/23/2021 0943   CBG (last 3)  Recent Labs    10/22/21 2018 10/22/21 2315 10/23/21 0453  GLUCAP 134* 223* 271*   Narrative & Impression  CLINICAL DATA:  A 61 year old male presents with chest pain and shortness of breath. History of stroke.   EXAM: PORTABLE CHEST 1 VIEW   COMPARISON:  October 21, 2021   FINDINGS: RIGHT IJ dialysis catheter tip in the upper superior vena cava similar to prior study. Tracheostomy tube in stable position, tip between clavicular heads.   Feeding tube and gastric tube coursing into the abdomen. Feeding tube tip in the descending duodenum. Gastric tube tip in the stomach.   Post median sternotomy as before. Stable cardiomediastinal contours.   Signs of percutaneous coronary intervention as well.   Decreased aeration at the LEFT lung base with potential reaccumulating pleural fluid in the LEFT lower chest since previous imaging. No pneumothorax. RIGHT chest is clear.   On limited assessment no acute skeletal findings.   IMPRESSION: 1. Decreased aeration/developing airspace  process with potential LEFT pleural effusion with reaccumulation since previous imaging. 2. Stable support devices.     Electronically Signed   By: Zetta Bills M.D.   On: 10/23/2021 09:37     Assessment/Plan: S/P Procedure(s) (LRB): REMOVAL OF IMPELLA LEFT VENTRICULAR ASSIST DEVICE (Right) TRANSESOPHAGEAL ECHOCARDIOGRAM (TEE) (N/A) PERCUTANEOUS TRACHEOSTOMY USING SHILEY FLEXIBLE 8 mm CUFFED TRACH. (N/A)  Still requiring vaspressors for BP support but much less when in sinus  rhythm. On midodrine 15 tid. Amio discontinued for junctional brady overnight but he may go back into AF with RVR which he does not tolerate either. Echo overnight looked ok.  WBC going up and possible developing airspace process on left by CXR. Having more secretions. Started on Pisinemo. Blood and resp cultures sent.  Tolerating tube feeds.  He is very restless. Meds adjusted by CCM.  LOS: 25 days    [name] 10/23/2021

## 2021-10-23 NOTE — Progress Notes (Signed)
Physical Therapy Treatment Patient Details Name: [name] MRN: [id number] DOB: 31-Jan-1961 Today's Date: 10/23/2021   History of Present Illness Pt is a 61 y.o. male admitted 09/28/21 with SOB, bradycardia, hypotension. Workup for bilateral pleural effusion, pericardial effusion s/p VATS with pericardial window 8/19. Cardiac arrest with tamponade 8/20, intubated with bedside mediastinal exploration, ECMO via fem access. Pt with mediastinal hemorrhage s/p reexploration 8/21. To OR 8/28 for washout, ECMO decannulation, Impella placement. CRRT initiated 8/28. Brief PEA arrest 8/29 with DCCV to NSR.  S/p chest closure 9/2. S/p trach and impella removed 9/7. S/p wound vac removal 9/11. PMH includes CAD (s/p recent CABG 09/12/21), CKD2, DM, HTN, AKI, afib, CVA.   PT Comments    Pt with increased lethargy this session secondary to premedication due to agitation earlier today. Pt opening eyes briefly to name, inconsistently following simple commands. Pt tolerated BUE PROM, BLE PROM/AAROM and tilt bed to 30' for ~5-min before need to return to supine with SBP down to 72 via art line (RN present). Pt remains limited by diffuse muscular weakness, decreased activity tolerance, and impaired cognition. Will continue to follow acutely to address established goals.     Recommendations for follow up therapy are one component of a multi-disciplinary discharge planning process, led by the attending physician.  Recommendations may be updated based on patient status, additional functional criteria and insurance authorization.  Follow Up Recommendations  PT at Long-term acute care hospital Can patient physically be transported by private vehicle: No   Assistance Recommended at Discharge Frequent or constant Supervision/Assistance  Patient can return home with the following Two people to help with walking and/or transfers;Two people to help with bathing/dressing/bathroom;Direct supervision/assist for medications  management;Assistance with feeding;Assistance with cooking/housework;Direct supervision/assist for financial management;Assist for transportation   Equipment Recommendations   (defer to next venue)    Recommendations for Other Services       Precautions / Restrictions Precautions Precautions: Sternal;Fall;Other (comment) Precaution Comments: CRRT, trach, vent, flexiseal, cortrak     Mobility  Bed Mobility Overal bed mobility: Needs Assistance             General bed mobility comments: max-totalA for repositioning    Transfers                   General transfer comment: tilt to 30 degrees for ~5 min with max, multimodal cues for quad activation ("stand up") pt able to engage RLE bettern than LLE; SBP down to 72 and pt with less interaction, therefore terminated tilting    Ambulation/Gait                   Stairs             Wheelchair Mobility    Modified Rankin (Stroke Patients Only)       Balance                                            Cognition Arousal/Alertness: Lethargic, Suspect due to medications (RN reports premedication due to agitation) Behavior During Therapy: Flat affect Overall Cognitive Status: Impaired/Different from baseline                                 General Comments: pt opening eyes for <5 sec to name, following some simple commands when awoken  and shaking head yes/no; actively engaging BLEs with movement to command (RLE > LLE), minimal BUE engagement        Exercises      General Comments General comments (skin integrity, edema, etc.): SBP via art line down to 72 with tilt to 30'      Pertinent Vitals/Pain Pain Assessment Pain Assessment: Faces Faces Pain Scale: Hurts a little bit Pain Location: RUE and RLE with PROM Pain Descriptors / Indicators: Grimacing Pain Intervention(s): Monitored during session    Home Living                          Prior  Function            PT Goals (current goals can now be found in the care plan section) Progress towards PT goals: Progressing toward goals (slowly; increased lethargy and hypotension this session)    Frequency    Min 3X/week      PT Plan Current plan remains appropriate    Co-evaluation              AM-PAC PT "6 Clicks" Mobility   Outcome Measure  Help needed turning from your back to your side while in a flat bed without using bedrails?: Total Help needed moving from lying on your back to sitting on the side of a flat bed without using bedrails?: Total Help needed moving to and from a bed to a chair (including a wheelchair)?: Total Help needed standing up from a chair using your arms (e.g., wheelchair or bedside chair)?: Total Help needed to walk in hospital room?: Total Help needed climbing 3-5 steps with a railing? : Total 6 Click Score: 6    End of Session   Activity Tolerance: Treatment limited secondary to medical complications (Comment);Patient limited by lethargy Patient left: in bed;with call bell/phone within reach;with nursing/sitter in room;with SCD's reapplied Nurse Communication: Mobility status PT Visit Diagnosis: Other abnormalities of gait and mobility (R26.89);Difficulty in walking, not elsewhere classified (R26.2);Muscle weakness (generalized) (M62.81)     Time: 6387-5643 PT Time Calculation (min) (ACUTE ONLY): 21 min  Charges:  $Therapeutic Activity: 8-22 mins                     Mabeline Caras, PT, DPT Acute Rehabilitation Services  Personal: [facility] Office: [place] 10/23/2021, 4:55 PM

## 2021-10-23 NOTE — Progress Notes (Addendum)
ADDENDUM  Requirements Pharmacy consulted to start daptomycin, micafungin, and meropenem given the long duration of vanc/meropenem and new art line infection.  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 male admitted on 09/28/2021 s/p pericardial window > now s/p open chest/ECMO. Pt was decannulated on 8/28, chest closed on 9/2, and Impella removed on 9/7. Pt has since had a persistently elevated WBC, with an acute rise overnight accompanied by increased pressor requirements. Of note, COOX is also low, repeat ordered. Will trend lactate as well. Pt has purulent drainage from art line site. Previous dosing of vanc 1g every 14 hours led to supratherapeutic troughs. Will load today. Cultures reordered. Pharmacy has been consulted for vancomycin and Zosyn dosing for possible sepsis.   Plan: Vancomycin 1750 mg load x 1 Vanc 750mg  IV Q24H Zosyn 3.375 g every 6 hours Obtain vanc trough after 4 doses  Height: 5\' 7"  (170.2 cm) Weight: 91.6 kg (201 lb 15.1 oz) IBW/kg (Calculated) : 66.1  Temp (24hrs), Avg:97.2 F (36.2 C), Min:96 F (35.6 C), Max:98.7 F (37.1 C)  Recent Labs  Lab 10/19/21 0441 10/19/21 1610 10/20/21 0454 10/20/21 1640 10/21/21 0403 10/21/21 1659 10/22/21 0406 10/22/21 1529 10/23/21 0450  WBC 20.1*  --  19.9*  --  18.5*  --  18.6*  --  23.2*  CREATININE 3.53*   < > 2.55*   < > 1.67* 1.44* 1.33* 1.16 1.38*   < > = values in this interval not displayed.     Estimated Creatinine Clearance: 60.7 mL/min (A) (by C-G formula based on SCr of 1.38 mg/dL (H)).    No Known Allergies  Antimicrobials this admission:  8/20 Vancomycin >9/9 8/20 Meropenem > 9/9  Dose adjustments this admission:  8/23 VT 21 8/27 VT = 26 8/28 VR = 15 (~1.5 hr after CRRT started)  Microbiology results:  8/24 wound: ngtd 9/1 Resp cx: ngtd 9/6 C diff: negative 9/13  Bcx: IP  Thank you for allowing pharmacy to participate in this patient's care.  Reatha Harps, PharmD PGY2 Pharmacy Resident 10/23/2021 9:52 AM Check [email or web address] for unit specific pharmacy number

## 2021-10-23 NOTE — Progress Notes (Addendum)
   Cardiology Consultation   Asked to see patient around midnight regarding new onset worsening hypotension around 10 PM for bedside TTE to rule out tamponade given previous history.  Bedside echo notable for relatively preserved biventricular function with trace pericardial effusion but no evidence of tamponade physiology.  Telemetry reviewed demonstrating transition from sinus rhythm/bradycardia to junctional rhythm and loss of atrial kick around time of progressive hypotension with systolics in the 70V requiring increasing levels of epinephrine.  Agree with holding amiodarone and further narcotics at this time and continue inotropic/pressor support for his hemodynamics and heart rate.  His systolics have settled out in the high 90s, granted on escalating inotropic support but given likely reversible causes to his junctional rhythm (antiarrythmic, vagal, narcotics etc), would wait for washout of antiarrhythmic and narcotics before invasive rate modalities.  Recommendations: -Continue inotropic support with systolic goal greater than 90 -Continue to hold amiodarone and narcotics as able -Agree with judicious volume resuscitation with continued invasive hemodynamic monitoring -If further deterioration, would consider atropine, isoproterenol, and transvenous pacing if necessary -K>4, Mg > 2  Signed, Silas Flood, MD  10/23/2021 12:35 AM

## 2021-10-23 NOTE — Progress Notes (Signed)
NAME:  [name], MRN:  [id number], DOB:  Jul 15, 1960, LOS: 57 ADMISSION DATE:  09/28/2021, CONSULTATION DATE:  09/29/2021 REFERRING MD:  Kipp Brood - TCTS CHIEF COMPLAINT: Dyspnea   History of Present Illness:  61 year old man who underwent CABG on 8/3, readmitted on 8/19 in setting of dyspnea with a pericardial effusion requiring pericardial window via bedside sternotomy. Also with bilateral pleural effusions. Sustained PEA arrest on 8/20, briefly required VA ECMO which was discontinued on 8/28, Impella placed 8/28, started on CRRT.  Hypoxemia, repeat cardiac arrest on 8/30. Underwent mediastinal washout/sternotomy closure 9/2. Impella removed and tracheostomy completed 9/7.  Pertinent Medical History:  CAD s/p CABG 8/3 DM2 Diverticulosis GERD Hyperlipidemia Hypertension History of stroke Tobacco Abuse  Significant Hospital Events: Including procedures, antibiotic start and stop dates in addition to other pertinent events   8/19 Admitted, s/p pericardial window 8/20 PEA cardiac arrest, left pigtail chest tube placement, PEA cardiac arrest due to tamponade, bedside sternotomy with pericardial drains placed. 8/21 Return to OR for overnight bleeding.  8/22 Minimal chest tube output.  Atrial fibrillation, controled with amiodarone.  8/22 TEE showed EF 35% at baseline, which improved significantly with decreasing ECMO flow.  8/23 Tolerated diuresis  8/24 Mediastinal washout, removal of hematoma 8/25 Ongoing bleeding issues from posterior mediastinum 8/28 Decannulated and off ECMO, 5.5 Impella placed, L femoral HD cath placed and started on CRRT 8/30 Hypoxia and hypotension followed by brief arrest, gentle L lateral chest compressions performed and ROSC in roughly 1 minute. 8/31 Remains deeply sedated on ventilator with open chest.  No acute events overnight. 9/1 No acute events overnight, remains on ventilator deep sedation.  Tolerating aggressive volume removal per CRRT 9/2 Sternotomy  closure 9/3 Wean versed 9/4 FOB, low grade temps, notable thick mucus/frequent suctioning 9/5 Vomiting, ?mucus plugging. PSV 5-6 hours on 10/5 9/6 Impella down to p3 9/7 Impella removal and trach. PRBC and DDAVP in OR  9/8 Low dose epi, weaned on 8/8 9/9 RIJ HD catheter placed 9/11 Started on ATC at 28% and tolerating well 9/12 Remains on 28% ATC 9/13 SB/Junction rhythm with associated hypotension overnight requiring transient pressor escalation. Pressor requirement down in AM. WBC count 23 (18), Co-ox 36%. Resp/Blood Cx.  Interim History / Subjective:  Overnight ~2240, became transiently hypotensive with SB/junctional rhythm Cards fellow completed bedside Echo demonstrating grossly preserved BiV function, trace pericardial effusion, no tamponade Amiodarone/Dilaudid gtt discontinued Required pressor escalation, now down to Epi 10mcg, vaso 0.03. Agitated/uncomfortable appearing, Precedex + Dilaudid PRN initiated CXR looks worse WBC jump from 18 to 23 Resp Cx/Blood Cx sent; pending Vanc/Zosyn initiated Increased Semglee/SSI to resistant scale  Objective   Blood pressure (!) 108/44, pulse 60, temperature 98.7 F (37.1 C), temperature source Oral, resp. rate 15, height 5\' 7"  (1.702 m), weight 91.6 kg, SpO2 100 %. CVP:  [9 mmHg-15 mmHg] 12 mmHg  FiO2 (%):  [28 %] 28 %   Intake/Output Summary (Last 24 hours) at 10/23/2021 0738 Last data filed at 10/23/2021 0700 Gross per 24 hour  Intake 3909.51 ml  Output 5233 ml  Net -1323.49 ml    Filed Weights   10/21/21 0500 10/22/21 0645 10/23/21 0615  Weight: 92.4 kg 91.7 kg 91.6 kg   Physical Examination: General: Acutely ill-appearing middle-aged man in NAD, appears uncomfortable/agitated, fidgeting. HEENT: [place]/AT, anicteric sclera, PERRL, moist mucous membranes. NGT in place. Tracheostomy in place. No surrounding erythema/edema, scant secretions. RIJ Trialysis in place. Neuro:  Awake, unable to assess orientation. Nods to questions.   Responds  to verbal stimuli. Following commands intermittently. Moves all 4 extremities spontaneously. +Cough and +Gag  CV: RRR, no m/g/r. PULM: Breathing even and mildly labored on trach collar (FiO2 25%, 5L). Lung fields rhonchorous bilaterally. GI: Soft, nontender, mildly distended. Hypoactive bowel sounds. Extremities: Trace symmetric BLE edema noted. Skin: Warm/dry, no rashes. Wounds as previously denoted. See medial tab for Lprior A-line photo.  Resolved Hospital Problem List:   Acute RV failure > resolved  Assessment & Plan:   Acute metabolic encephalopathy Required Dilaudid gtt discontinuation 9/13 early AM for hypotension/bradycardia. - Precedex initiation + intermittent Dilaudid for dressing changes PRN - Continue oxycodone VT - Continue scheduled Klonopin, Seroquel - Goal RASS 0 to -1 - PT/OT/SLP as clinically appropriate - Delirium precautions  Hemorrhagic pericarditis with tamponade PEA arrest due to tamponade, s/p emergent sternotomy Cardiogenic & hemorrhagic shock, s/p VA ECMO (decannulated 8/29), s/p Impella (removed 9/7) CAD s/p CABG x3 Afib  Bedside Echo completed 9/12PM with preserved BiV function, trace pericardial effusion. Overnight 9/12-9/13 entered into SB then junctional rhythm with associated hypotension. Amio gtt stopped. - Continue Epi gtt titrated to goal MAP > 65 - Continue vasopressin - Continue midodrine - Amiodarone discontinued - Continue ASA/statin - F/u Co-ox - Remainder per Cards/TCTS  Leukocytosis WBC increase from 18 to 23 9/12 - 9/13. CXR with worsened aeration/developing airspace process, ?L pleural effusion reaccumulation. - Trend WBC - F/u Resp Cx, Blood Cx - Vanc/Zosyn reinitiated 9/13  Acute respiratory failure with hypoxemia and hypercarbia S/p Tracheostomy  - Tolerating ATC since 9/11 - Continue supplemental O2 support - Wean FiO2 for O2 sat > 90%, current ATC 28% FiO2 - VAP bundle - Routine trach care, ok for suture removal  9/14 - Pulmonary hygiene (CPT, nebs - ongoing intermittent mucus plugging) - PAD protocol for sedation: Precedex and Dilaudid for goal RASS 0 to -1  AKI with anuria  Hypophosphatemia - Nephro following, appreciate assistance - Continue CRRT - Trend BMP - Replete electrolytes as indicated - Monitor I&Os - Avoid nephrotoxic agents as able - Ensure adequate renal perfusion  ABLA, previous hemorrhagic shock Thrombocytosis  - Trend H&H - Monitor for signs of active bleeding - Transfuse for Hgb < 7.0, Plt < 20 or hemodynamically significant bleeding  Maculopapular rash- only new thing is banatrol. No history of food allergies. Banatrol stopped 9/10. - Continue to monitor - Benadryl PRN  Vomiting Diarrhea, improving - NGT to ILWS - TF via Cortrak - Hold bowel regimen as needed  DM II with hyperglycemia - Basal Semglee increased to 10U daily - SSI, increased to resistant scale 9/13 - CBGs Q4H - Goal CBG 140-180  At Risk Malnutrition   - TF via Cortrak  Lines/Tubes/Drains - FMS (8/25)  - Trach (9/7) - L Fem HD cath (8/28 > 9/8) - LIJ CVC (8/20 > 9/10)  - L rad art line (8/19 > 9/13) - R rad art line (9/13 > ) - RIJ HD catheter (9/9 > )  Best Practice (right click and "Reselect all SmartList Selections" daily)  Diet/type: tubefeeds  DVT prophylaxis: prophylactic heparin  GI prophylaxis: PPI Lines: Central line, Dialysis Catheter, and Arterial Line Foley:  N/A Code Status:  full code Last date of multidisciplinary goals of care discussion [Per Primary]  Critical care time:   The patient is critically ill with multiple organ system failure and requires high complexity decision making for assessment and support, frequent evaluation and titration of therapies, advanced monitoring, review of radiographic studies and interpretation of complex data.  Critical Care Time devoted to patient care services, exclusive of separately billable procedures, described in this note is  42 minutes.  Lestine Mount, PA-C [facility] 10/23/21 7:43 AM  Please see [email or web address] for pager details.  From 7A-7P if no response, please call [phone] After hours, please call ELink [phone]

## 2021-10-23 NOTE — Progress Notes (Addendum)
Patient ID: [name], male   DOB: 04-23-1960, 61 y.o.   MRN: [id number]   Advanced Heart Failure Rounding Note   Subjective:    - 8/19 Pericardial window - 8/20 Cardiac arrest with tamponade -> Emergent bedside washout - 09/29/21 VA Cannulation - 09/30/21 Return to OR for mediastinal hemorrhage - 10/01/21 Developed AF -> amio - 10/02/21 TEE EF 25-30%  - 10/04/21 OR for washout. C/b continued bleeding overnight - 10/07/21 Placement of Impella 5.5 with washout, VA ECMO decannulation. Hypotensive with development of severe RV dysfunction after ECMO off and pressors titrated up. Multiple units of blood products. - 10/08/21 Brief PEA arrest. AFL with RVR >> S/p DCCV to SR, back in AFL shortly after - 8/31 Give 1UPRBCs  - 9/2 OR for chest closure - 9/3 Hypotensive overnight w/ SBPs in 80s. Febrile, mTemp 100.8. CRRT paused. VP increased to 0.04.  - 9/4 s/p bronchoscopy w/ BAL by PCCM, Cx NGTD  - 9/5 vomiting w/ large volume NGT output + watery/foul diarrhea, Tube feeds held. No signs of ileus on KUB. C-diff negative   - 9/7 OR for Impella Extraction and percutaneous tracheostomy. 2 u RBCs. - 9/8 CVVH stopped and line removed for holiday - 9/9 CVVH restarted.    Developed junctional bradycardia overnight and hypotension requiring higher doses of Epi.   Amiodarone stopped  On Epi 17>>4 + Vaso 0.03. CO-OX 36%>52%  CVP 9. Continues on CRRT, pulling for negative 50/hr. 3.4L volume removed yesterday.   WBCs trending up, 18>23K  Purulent drainage from Aline noted, line replaced  Awake and following commands. More agitated. Started on precedex by CCM.   Echo 08/28: LV EF 55-60% with severe LVH, small ventricular cavity, RV appears to have near normal systolic function   Objective:     Vital Signs:   Temp:  [96 F (35.6 C)-98.7 F (37.1 C)] 98.7 F (37.1 C) (09/13 0717) Pulse Rate:  [42-74] 70 (09/13 1000) Resp:  [13-27] 18 (09/13 1000) BP: (108)/(44) 108/44 (09/12 2106) SpO2:  [95  %-100 %] 100 % (09/13 1000) Arterial Line BP: (63-137)/(24-75) 137/48 (09/13 0515) FiO2 (%):  [28 %] 28 % (09/13 0800) Weight:  [91.6 kg] 91.6 kg (09/13 0615) Last BM Date : 10/22/21  Weight change: Filed Weights   10/21/21 0500 10/22/21 0645 10/23/21 0615  Weight: 92.4 kg 91.7 kg 91.6 kg    Intake/Output:   Intake/Output Summary (Last 24 hours) at 10/23/2021 1045 Last data filed at 10/23/2021 1000 Gross per 24 hour  Intake 3857.3 ml  Output 5116 ml  Net -1258.7 ml   Physical Exam General:  Appears agitated ENT: + trach, + cor Neck: supple. JVP 8-10. Carotids 2+ bilat; no bruits. R IJ HD cath. Cor: PMI nondisplaced. Regular rate & rhythm. No rubs, gallops or murmurs. Sternum closed Lungs: clear Abdomen: soft, nontender, nondistended. Good bowel sounds. Extremities: no cyanosis, clubbing, rash, edema Neuro: Awake.     Telemetry: SR 70s currently   Labs: Basic Metabolic Panel: Recent Labs  Lab 10/19/21 0441 10/19/21 1610 10/20/21 0454 10/20/21 1640 10/21/21 0403 10/21/21 1659 10/22/21 0406 10/22/21 1529 10/22/21 2354 10/23/21 0450  NA 133*   < > 135   < > 137 137 136 136 136 136  K 4.0   < > 4.0   < > 4.3 4.3 3.8 4.5 4.4 4.9  CL 100   < > 101   < > 102 104 104 106  --  105  CO2 21*   < > 21*   < >  _0 21*  --  20*  GLUCOSE 196*   < > 135*   < > 169* 159* 205* 261*  --  285*  BUN 47*   < > 33*   < > 25* _1 --  22  CREATININE 3.53*   < > 2.55*   < > 1.67* 1.44* 1.33* 1.16  --  1.38*  CALCIUM 8.5*   < > 8.4*   < > 8.3* 8.4* 8.2* 7.6*  --  7.9*  MG 2.6*  --  2.5*  --  2.6*  --  2.4  --   --  2.4  PHOS 3.2   < > 2.7   < > 3.1 1.8* 1.5* 3.2  --  2.3*   < > = values in this interval not displayed.    Liver Function Tests: Recent Labs  Lab 10/21/21 0403 10/21/21 1659 10/22/21 0406 10/22/21 1529 10/23/21 0450  AST  --   --  38  --   --   ALT  --   --  22  --   --   ALKPHOS  --   --  137*  --   --   BILITOT  --   --  1.0  --   --   PROT  --    --  6.2*  --   --   ALBUMIN 2.5* 2.4* 2.4*  2.4* 2.4* 2.4*   No results for input(s): "LIPASE", "AMYLASE" in the last 168 hours. No results for input(s): "AMMONIA" in the last 168 hours.  CBC: Recent Labs  Lab 10/19/21 0441 10/20/21 0454 10/21/21 0403 10/22/21 0406 10/22/21 2354 10/23/21 0450  WBC 20.1* 19.9* 18.5* 18.6*  --  23.2*  HGB 9.2* 8.7* 8.9* 8.5* 9.2* 8.5*  HCT 28.6* 27.4* 28.1* 28.0* 27.0* 27.2*  MCV 93.2 92.3 91.8 94.6  --  95.1  PLT 533* 502* 495* 497*  --  488*    Cardiac Enzymes: No results for input(s): "CKTOTAL", "CKMB", "CKMBINDEX", "TROPONINI" in the last 168 hours.  BNP: BNP (last 3 results) Recent Labs    12/24/20 0651 09/28/21 0405  BNP 412.0* 826.6*    ProBNP (last 3 results) No results for input(s): "PROBNP" in the last 8760 hours.    Other results:  Imaging: DG Chest Port 1 View  Result Date: 10/23/2021 CLINICAL DATA:  A 61 year old male presents with chest pain and shortness of breath. History of stroke. EXAM: PORTABLE CHEST 1 VIEW COMPARISON:  October 21, 2021 FINDINGS: RIGHT IJ dialysis catheter tip in the upper superior vena cava similar to prior study. Tracheostomy tube in stable position, tip between clavicular heads. Feeding tube and gastric tube coursing into the abdomen. Feeding tube tip in the descending duodenum. Gastric tube tip in the stomach. Post median sternotomy as before. Stable cardiomediastinal contours. Signs of percutaneous coronary intervention as well. Decreased aeration at the LEFT lung base with potential reaccumulating pleural fluid in the LEFT lower chest since previous imaging. No pneumothorax. RIGHT chest is clear. On limited assessment no acute skeletal findings. IMPRESSION: 1. Decreased aeration/developing airspace process with potential LEFT pleural effusion with reaccumulation since previous imaging. 2. Stable support devices. Electronically Signed   By: Zetta Bills M.D.   On: 10/23/2021 09:37      Medications:     Scheduled Medications:  sodium chloride   Intravenous Once   acetaminophen (TYLENOL) oral liquid 160 mg/5 mL  650 mg Per Tube Q6H   aspirin  81 mg Per  Tube Daily   atorvastatin  80 mg Per Tube Daily   Chlorhexidine Gluconate Cloth  6 each Topical Daily   clonazePAM  0.5 mg Per Tube Q8H   collagenase   Topical Daily   darbepoetin (ARANESP) injection - NON-DIALYSIS  60 mcg Subcutaneous Q Mon-1800   docusate  100 mg Per Tube BID   feeding supplement (PROSource TF20)  60 mL Per Tube TID   folic acid  1 mg Per Tube Daily   heparin injection (subcutaneous)  5,000 Units Subcutaneous Q8H   insulin aspart  0-20 Units Subcutaneous Q4H   insulin glargine-yfgn  10 Units Subcutaneous QHS   midodrine  15 mg Per Tube TID WC   multivitamin  1 tablet Per Tube QHS   mouth rinse  15 mL Mouth Rinse 4 times per day   oxyCODONE  20 mg Per Tube Q6H   pantoprazole (PROTONIX) IV  40 mg Intravenous Q12H   polyethylene glycol  17 g Per Tube Daily   QUEtiapine  50 mg Per Tube BID   sennosides  5 mL Per Tube QHS   sodium chloride flush  10-40 mL Intracatheter Q12H   sodium chloride flush  3 mL Intravenous Q12H    Infusions:   prismasol BGK 4/2.5 500 mL/hr at 10/23/21 0234    prismasol BGK 4/2.5 600 mL/hr at 10/23/21 0646   sodium chloride 10 mL/hr at 10/23/21 1041   sodium chloride     albumin human Stopped (10/15/21 1901)   amiodarone Stopped (10/22/21 2247)   dexmedetomidine (PRECEDEX) IV infusion 0.6 mcg/kg/hr (10/23/21 1000)   epinephrine 4 mcg/min (10/23/21 1000)   feeding supplement (VITAL 1.5 CAL) 60 mL/hr at 10/23/21 1000   norepinephrine (LEVOPHED) Adult infusion Stopped (10/23/21 0234)   piperacillin-tazobactam     prismasol BGK 4/2.5 1,800 mL/hr at 10/23/21 0901   vancomycin     [START ON 10/24/2021] vancomycin     vasopressin 0.03 Units/min (10/23/21 1000)    PRN Medications: sodium chloride, albumin human, camphor-menthol, dextrose, diphenhydrAMINE, heparin,  hydrALAZINE, HYDROmorphone (DILAUDID) injection, HYDROmorphone (DILAUDID) injection, ipratropium-albuterol, midazolam, ondansetron (ZOFRAN) IV, mouth rinse, polyvinyl alcohol, sodium chloride flush, sodium chloride flush   Assessment/Plan:    1. Shock - mixed cardiogenic/hemorrhagic -> VA ECMO -> decannulated on 8/28 to Impella 5.5 - Echo 08/28: Underfilled LV, EF 55-60% with severe LVH and near normal RV systsystolic function.  - Impella extracted 9/7 - Increased pressor requirements overnight and WBCs trending up.  - Purulent drainage from Aline noted, replaced by CCM.  - BC X 2, resp culture, procalcitionin and lactic acid pending.  - Discussed with ID. Given prolonged abx course earlier in stay, addition of Daptomycin, Micafungin and meropenem recommended.  - Discussed with CCM. -CO-OX 36%>>52%. CVP 9. Bedside echo - LV function preserved, RV mildly reduced. Suspect low CO-OX d/t volume depletion. Suggest stopping CRRT for now. Dr. Haroldine Laws reviewed with Nephrology.   2. Cardiac arrest (PEA/bradycardic) - 8/19 in setting of tamponade - PEA arrest again on 08/28  3. Cardiac tamponade with emergent bedside sternotomy  - Diffuse epicardial bleeding with post-op Dresslers - return to OR 8/21 and 8/24 for washouts.  - Washout 8/28 in OR. Multiple units of blood products in OR.   - Chest closed on 9/2 - Hgb now stable.  4. Acute hypoxemic respiratory failure - Off ECMO.  - Vent support per CCM - Perc Trach placed 9/7  - He has completed course of Vanc + meropenem.  - s/p bronchoscopy w/ BAL 9/4.  Cx NGTD   5. AKI due to ATN - CRRT started 08/28.  - Suggest stopping CRRT today as above d/t concern for volume depletion  6. 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 Now off colchicine with CRRT.  7. 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 Continue aspirin 81 mg daily  8. DM2 - continue SSI   9. PAF/AFL - Tolerates poorly.  - Recurrent AFL. S/p DCCV   to SR 08/29.  - SR today.   - Off amio d/t junctional bradycardia overnight - Not on AC for now d/t bleeding, DVT dose heparin. Hgb now stable.  10. ID - Completed vancomycin/meropenem with open chest.  - Now with concern for worsening sepsis. See discussion in #1  11. Neuro -off sedation, following commands   12. FEN - TFs ongoing  - Tolerating better today  13. Hyperkalemia - resolved with RRT.   14. GI  - vomiting w/ large volume NGT output + watery/foul diarrhea 9/5. KUB negative for ileus - C-diff negative  - now off Reglan, improving     FINCH, LINDSAY N, PA-C  10:45 AM  Agree with above.   More agitated and tenuous. Pressor demands increasing. WBC going up.   On vent through trach  Remains on CVVHD. CVP 9-10. Co-ox 52%  General:  Awake agitated  Will follow some commands HEENT: normal Neck: supple. CVP 9 _ trach Cor: PMI nondisplaced. Regular rate & rhythm. No rubs, gallops or murmurs. Lungs: clear Abdomen: soft, nontender, nondistended. No hepatosplenomegaly. No bruits or masses. Good bowel sounds. Extremities: no cyanosis, clubbing, rash, tr edema Neuro: Awake agitated  Will follow some commands  I did echo at bedside EF 60% RV ok. Small clot in posterior pericardium but no hemodynamic effect.   Suspect he has recurrent sepsis. Art line changed. BCx being drawn. D/w ID and CCM. Restarting abx + antifungals.  Volume status looks low on echo. Spoke with Renal. Will run CVVHD slightly positive to even.   CRITICAL CARE Performed by: Glori Bickers  Total critical care time: 40 minutes  Critical care time was exclusive of separately billable procedures and treating other patients.  Critical care was necessary to treat or prevent imminent or life-threatening deterioration.  Critical care was time spent personally by me (independent of midlevel providers or residents) on the following activities: development of treatment plan with patient and/or  surrogate as well as nursing, discussions with consultants, evaluation of patient's response to treatment, examination of patient, obtaining history from patient or surrogate, ordering and performing treatments and interventions, ordering and review of laboratory studies, ordering and review of radiographic studies, pulse oximetry and re-evaluation of patient's condition.  Glori Bickers, MD  12:17 PM

## 2021-10-23 NOTE — Progress Notes (Signed)
[facility] Progress Note Patient Name: [name] DOB: 07-23-1960 MRN: [id number]   Date of Service  10/23/2021  HPI/Events of Note  Blood pressure remains soft.  eICU Interventions  Norepinephrine gtt added, cardiology fellow to come and do a bedside echo to r/o tamponade / look for other possible issues.        Kerry Kass Samiah Ricklefs 10/23/2021, 12:25 AM

## 2021-10-23 NOTE — Progress Notes (Signed)
Approximately 85cc of Dilaudid from infusion bag wasted in Stericycle bin with second RN, Terie Purser, as witness.

## 2021-10-24 ENCOUNTER — Inpatient Hospital Stay (HOSPITAL_COMMUNITY): Payer: Self-pay

## 2021-10-24 LAB — COPPER, SERUM: Copper: 81 ug/dL (ref 69–132)

## 2021-10-24 LAB — RENAL FUNCTION PANEL
Albumin: 2.4 g/dL — ABNORMAL LOW (ref 3.5–5.0)
Albumin: 2.4 g/dL — ABNORMAL LOW (ref 3.5–5.0)
Anion gap: 10 (ref 5–15)
Anion gap: 7 (ref 5–15)
BUN: 19 mg/dL (ref 8–23)
BUN: 15 mg/dL (ref 8–23)
CO2: 23 mmol/L (ref 22–32)
CO2: 26 mmol/L (ref 22–32)
Calcium: 7.9 mg/dL — ABNORMAL LOW (ref 8.9–10.3)
Calcium: 8 mg/dL — ABNORMAL LOW (ref 8.9–10.3)
Chloride: 105 mmol/L (ref 98–111)
Chloride: 105 mmol/L (ref 98–111)
Creatinine, Ser: 1.2 mg/dL (ref 0.61–1.24)
Creatinine, Ser: 0.99 mg/dL (ref 0.61–1.24)
GFR, Estimated: >60 mL/min (ref >60)
GFR, Estimated: >60 mL/min (ref >60)
Glucose, Bld: 266 mg/dL — ABNORMAL HIGH (ref 70–99)
Glucose, Bld: 157 mg/dL — ABNORMAL HIGH (ref 70–99)
Phosphorus: 3 mg/dL (ref 2.5–4.6)
Phosphorus: 1.7 mg/dL — ABNORMAL LOW (ref 2.5–4.6)
Potassium: 4 mmol/L (ref 3.5–5.1)
Potassium: 4.3 mmol/L (ref 3.5–5.1)
Sodium: 138 mmol/L (ref 135–145)
Sodium: 138 mmol/L (ref 135–145)

## 2021-10-24 LAB — COOXEMETRY PANEL
Carboxyhemoglobin: 2.3 % — ABNORMAL HIGH (ref 0.5–1.5)
Carboxyhemoglobin: 1.5 % (ref 0.5–1.5)
Methemoglobin: 2.6 % — ABNORMAL HIGH (ref 0.0–1.5)
Methemoglobin: <0.7 % (ref 0.0–1.5)
O2 Saturation: 48.1 %
O2 Saturation: 48.9 %
Total hemoglobin: 10.1 g/dL — ABNORMAL LOW (ref 12.0–16.0)
Total hemoglobin: 8.7 g/dL — ABNORMAL LOW (ref 12.0–16.0)

## 2021-10-24 LAB — MAGNESIUM: Magnesium: 2.2 mg/dL (ref 1.7–2.4)

## 2021-10-24 LAB — CBC
HCT: 26.7 % — ABNORMAL LOW (ref 39.0–52.0)
Hemoglobin: 8.2 g/dL — ABNORMAL LOW (ref 13.0–17.0)
MCH: 29.2 pg (ref 26.0–34.0)
MCHC: 30.7 g/dL (ref 30.0–36.0)
MCV: 95 fL (ref 80.0–100.0)
Platelets: 429 10*3/uL — ABNORMAL HIGH (ref 150–400)
RBC: 2.81 MIL/uL — ABNORMAL LOW (ref 4.22–5.81)
RDW: 17.5 % — ABNORMAL HIGH (ref 11.5–15.5)
WBC: 21.8 10*3/uL — ABNORMAL HIGH (ref 4.0–10.5)
nRBC: 1.8 % — ABNORMAL HIGH (ref 0.0–0.2)

## 2021-10-24 LAB — GLUCOSE, CAPILLARY
Glucose-Capillary: 227 mg/dL — ABNORMAL HIGH (ref 70–99)
Glucose-Capillary: 149 mg/dL — ABNORMAL HIGH (ref 70–99)
Glucose-Capillary: 162 mg/dL — ABNORMAL HIGH (ref 70–99)
Glucose-Capillary: 95 mg/dL (ref 70–99)
Glucose-Capillary: 144 mg/dL — ABNORMAL HIGH (ref 70–99)
Glucose-Capillary: 199 mg/dL — ABNORMAL HIGH (ref 70–99)
Glucose-Capillary: 205 mg/dL — ABNORMAL HIGH (ref 70–99)

## 2021-10-24 LAB — ZINC: Zinc: 101 ug/dL (ref 44–115)

## 2021-10-24 MED ORDER — SODIUM PHOSPHATES 45 MMOLE/15ML IV SOLN
45.0000 mmol | Freq: Once | INTRAVENOUS | Status: AC
  Administered 2021-10-24: 45 mmol via INTRAVENOUS
  Filled 2021-10-24: qty 15

## 2021-10-24 MED ORDER — HYDROMORPHONE HCL 1 MG/ML IJ SOLN
1.0000 mg | INTRAMUSCULAR | Status: DC | PRN
  Administered 2021-10-24 – 2021-11-22 (×65): 1 mg via INTRAVENOUS
  Filled 2021-10-24 (×66): qty 1

## 2021-10-24 NOTE — Progress Notes (Signed)
[facility] Progress Note  Name: [name] MRN: [id number] DOB: [birth year]   Subjective:  Seen and examined on CRRT.  D/w RN.  Overall stable on CV with no issues. Nearly even.    Intake/Output Summary (Last 24 hours) at 10/24/2021 1103 Last data filed at 10/24/2021 1000 Gross per 24 hour  Intake 4330.42 ml  Output 3942 ml  Net 388.42 ml    Vitals:  Vitals:   10/24/21 0930 10/24/21 0945 10/24/21 1000 10/24/21 1015  BP:      Pulse: 66 67 66 65  Resp: (!) 25 16 20  (!) 22  Temp:      TempSrc:      SpO2: 100% 100% 100% 100%  Weight:      Height:         Physical Exam:          General lying in bed, ill appearing HEENT normocephalic atraumatic   Neck trach in place on TC Lungs coarse mechanical breath sounds, no rales, bilateral chest rise Heart no rub, normal rate Abdomen soft nontender nondistended Extremities trace edema in hips  Neuro - nods to questioning Access: RIJ nontunneled catheter    Medications reviewed   Labs:     Latest Ref Rng & Units 10/24/2021    3:32 AM 10/23/2021    8:00 PM 10/23/2021    3:43 PM  BMP  Glucose 70 - 99 mg/dL 266  195  254   BUN 8 - 23 mg/dL 19  21  21    Creatinine 0.61 - 1.24 mg/dL 1.20  1.31  1.30   Sodium 135 - 145 mmol/L 138  136  137   Potassium 3.5 - 5.1 mmol/L 4.0  4.3  4.6   Chloride 98 - 111 mmol/L 105  106  106   CO2 22 - 32 mmol/L 23  22  23    Calcium 8.9 - 10.3 mg/dL 7.9  7.9  7.6      Assessment/Plan:   Pt is a 61 y.o. yo male  with history of HTN, HLD, DM, A-fib, stroke, CKD, CAD multivessel dz status post CABG on 8/3 presented with shortness of breath, found to have cardiac tamponade, course complicated by cardiac arrest, use of ECMO, seen as a consultation for the evaluation of AKI and fluid volume management.   #Acute kidney injury on CKD IIIa, nonoliguric: Ischemic ATN due to cardiogenic shock/cardiac arrest and multiple hemodynamic changes.  Started CRRT on 8/28 for decreasing urine output  and fluid/volume management.  Brief (less than 24 hours) line free interval after clotting and CRRT resumed  on 9/9 after nontunneled line with critical care - Continue CRRT  - on 4K Fluids  - near even for now - team would prefer citrate if agent needed for clotting -  No anticoagulation currently and hopeful that new line in IJ will have less issues  - transition to iHD as able. Vasopressor support being weaned.   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  decannulated  with placement of Impella. On colchicine per cardiology S/p closure of the sternum and mediastinal washout on 9/2.   #Cardiogenic shock/cardiac arrest: Was on ECMO before. Continue pressors per primary team.  Impella out on 9/7    #Acute hypoxic respiratory failure:  improving - now on TC.  optimizing volume with CRRT    #Acute blood loss anemia: Monitor hemoglobin and transfuse as needed by ICU team.  Aranesp 40 mcg weekly on Mondays started  on 9/4.  Increased dose to 60 mcg weekly      #Hypophosphatemia due to CRRT, replete sodium phosphate as needed  Disposition - continue ICU monitoring  Reesa Chew, MD 10/24/2021 11:03 AM

## 2021-10-24 NOTE — Progress Notes (Signed)
Cortrak Tube Team Note:  Consult received to re-advance a Cortrak feeding tube further into the jejunum. Tube was left at 110 cm by previous RD. This RD left tube at 100 cm.   X-ray is required, abdominal x-ray has been ordered by the Cortrak team. Please confirm tube placement before using the Cortrak tube.   If the tube becomes dislodged please keep the tube and contact the Cortrak team at [email or web address] ([email or web address]) for replacement.  If after hours and replacement cannot be delayed, place a NG tube and confirm placement with an abdominal x-ray.    Hermina Barters RD, LDN Clinical Dietitian See Shea Evans for contact information.

## 2021-10-24 NOTE — Progress Notes (Signed)
Nutrition Follow-up  DOCUMENTATION CODES:   Not applicable  INTERVENTION:    Tube Feeding via Cortrak (tip at the duodenal jejunal flexure): Vital 1.5 at 60 ml/hr Pro-source TF20 60 mL TID This provides 2400 kcals, 157 g of protein and 1094 mL of free water  Continue aggressive phosphorus supplementation while on CRRT  Add Folic Acid 1 mg daily, Thiamine 100 mg daily Continue Renal MVI daily Plan to go ahead and supplement Vit C 250 mg BID and Zinc Sulfate 220 mg BID given increased risk for deficiency and non healing wounds. RD can adjust as needed once lab results return  Await further micronutrient labs  NUTRITION DIAGNOSIS:   Inadequate oral intake related to acute illness as evidenced by NPO status.  Being addressed via TF   GOAL:   Patient will meet greater than or equal to 90% of their needs  Progressing  MONITOR:   Vent status, Labs, Weight trends, TF tolerance  REASON FOR ASSESSMENT:   Ventilator    ASSESSMENT:   61 yo male admitted with acute on chronic CHF with recent CABG, symptomatic bradycardia and hypotension. PMH includes DM, CAD s/p recent CABG x 5, CKD 3, CVA  8/19 Pericardial window, evacuation of pericardial and pleural effusion 8/20 Intubated, Bedside mediastinal re-exploration, cardiac tamponade, mediastinal clot; PEA arrest due to tamponade, VA ECMO cannulation 8/21 Return to OR for bleeding, mediastinal re-exploration, Cortrak placed-gastric 8/23 TEE with EF 25-30% 8/25 Cortrak advanced to Post-Pyloric position 8/28 OR: VA ECMO decannulation, Impella placed, mediastinal washout, CRRT initiated 8/29 Brief arrest 9/02 OR: mediastinal washout, sternal closure 9/05 Vomiting large volume, TF held, NG placed to LIS, watery, foul smelling diarrhea, KUB negative for ileus/obstruction, confirmed Cortrak is post pyloric 9/07 OR: Impella removed, Trach placed 9/08 CRRT held 9/09 CRRT restarted, MD changed to Nepro 9/10 TF off due to  intolerance 9/11 Switched back to Nepro with better tolerace 9/13 Vital 1.5 advanced to goal  9/14 Cortrak advanced to the duodenal jejunal flexure   Pt remains on vent support via trach, remains on CRRT (4K fluids), UF goal net negative 50 ml/hr  Micronutrient Profile:  CRP: 84.6 (H) Folic Acid: 5.2 (L) Thiamine: 165 (wdl) Vitamin A: pending Vitamin C: pending Vitamin D: 15.51  (L) Vitamin B12: 2063 (H)  Copper: 81 Zinc: 101  Labs: reviewed CBG's: 95-227  Meds: Vitamin C 250 mg BID (9/62), colace, folic acid, SSI, 10 units Semglee daily (9/13), Rena-vite, senokot, miralax, aranesp, thiamine, zinc sulfate 220 mg (9/13) Precedex  Epinephrine @ 9 mcg Vasopressin @ .03 units   Diet Order:   Diet Order             Diet NPO time specified  Diet effective midnight                   EDUCATION NEEDS:   Not appropriate for education at this time  Skin:  Skin Assessment: Skin Integrity Issues: Skin Integrity Issues:: Unstageable Stage II: R upper buttock, L ischium Unstageable: L upper  buttock, gluteal cleft/distal sacrum Incisions: chest (closed, wound VAC removed) Other: sheer/friction to right scapula region  Last BM:  900 ml via rectal tube  Height:   Ht Readings from Last 1 Encounters:  10/23/21 5\' 7"  (1.702 m)    Weight:   Wt Readings from Last 1 Encounters:  10/24/21 94.3 kg     BMI:  Body mass index is 32.56 kg/m.  Estimated Nutritional Needs:   Kcal:  2200-2400 kcals  Protein:  135-165  g  Fluid:  1.8 L  Rhyan Wolters P., RD, LDN, CNSC See AMiON for contact information

## 2021-10-24 NOTE — Progress Notes (Addendum)
Patient ID: [name], male   DOB: [birth year], 61 y.o.   MRN: [id number]   Advanced Heart Failure Rounding Note   Subjective:    - 8/19 Pericardial window - 8/20 Cardiac arrest with tamponade -> Emergent bedside washout - 09/29/21 VA Cannulation - 09/30/21 Return to OR for mediastinal hemorrhage - 10/01/21 Developed AF -> amio - 10/02/21 TEE EF 25-30%  - 10/04/21 OR for washout. C/b continued bleeding overnight - 10/07/21 Placement of Impella 5.5 with washout, VA ECMO decannulation. Hypotensive with development of severe RV dysfunction after ECMO off and pressors titrated up. Multiple units of blood products. - 10/08/21 Brief PEA arrest. AFL with RVR >> S/p DCCV to SR, back in AFL shortly after - 8/31 Give 1UPRBCs  - 9/2 OR for chest closure - 9/3 Hypotensive overnight w/ SBPs in 80s. Febrile, mTemp 100.8. CRRT paused. VP increased to 0.04.  - 9/4 s/p bronchoscopy w/ BAL by PCCM, Cx NGTD  - 9/5 vomiting w/ large volume NGT output + watery/foul diarrhea, Tube feeds held. No signs of ileus on KUB. C-diff negative   - 9/7 OR for Impella Extraction and percutaneous tracheostomy. 2 u RBCs. - 9/8 CVVH stopped and line removed for holiday - 9/9 CVVH restarted.  -9/13 Worsening leukocytosis and pressor requirements. Purulent drainage from arterial line. Started Daptomycin, Micafungin and Meropenem.  CO-OX 48%. Recheck.  Continues on Epi 9 and Vaso 0.03. Pressor requirement up briefly overnight with dilaudid.  CVP 9-10. Continues on CRRT, pulling for even to slightly positive.  WBCs 18>23>22K. BC X 2 with NGTD, tracheal aspirate pending. Continues on IV abx.  Remains restless. Having a lot of pain at sight of sacral wound   Echo 08/28: LV EF 55-60% with severe LVH, small ventricular cavity, RV appears to have near normal systolic function   Objective:     Vital Signs:   Temp:  [96.4 F (35.8 C)-97.8 F (36.6 C)] 96.8 F (36 C) (09/13 2342) Pulse Rate:  [54-74] 66 (09/14 0815) Resp:   [14-27] 27 (09/14 0815) BP: (98-117)/(39-55) 117/53 (09/14 0748) SpO2:  [98 %-100 %] 100 % (09/14 0815) FiO2 (%):  [28 %] 28 % (09/14 0748) Weight:  [94.3 kg] 94.3 kg (09/14 0500) Last BM Date : 10/24/21  Weight change: Filed Weights   10/22/21 0645 10/23/21 0615 10/24/21 0500  Weight: 91.7 kg 91.6 kg 94.3 kg    Intake/Output:   Intake/Output Summary (Last 24 hours) at 10/24/2021 0830 Last data filed at 10/24/2021 0800 Gross per 24 hour  Intake 4332.76 ml  Output 4021 ml  Net 311.76 ml   General:  Awake. Restless, appears uncomfortable. HEENT: + trach, + NG Neck: supple. JVP 8-10. Carotids 2+ bilat; no bruits. R IJ HD cath Cor: PMI nondisplaced. Regular rate & rhythm. No rubs, gallops or murmurs. Sternum closed Lungs: clear Abdomen: soft, nontender, nondistended. + BS Extremities: no cyanosis, clubbing, rash, edema Neuro: Awake and following some commands   Telemetry: SR 50s-60s   Labs: Basic Metabolic Panel: Recent Labs  Lab 10/20/21 0454 10/20/21 1640 10/21/21 0403 10/21/21 1659 10/22/21 0406 10/22/21 1529 10/22/21 2354 10/23/21 0450 10/23/21 1543 10/23/21 2000 10/24/21 0332  NA 135   < > 137   < > 136 136 136 136 137 136 138  K 4.0   < > 4.3   < > 3.8 4.5 4.4 4.9 4.6 4.3 4.0  CL 101   < > 102   < > 104 106  --  105 106 106 105  CO2 21*   < > 22   < > 24 21*  --  20* _0 GLUCOSE 135*   < > 169*   < > 205* 261*  --  285* 254* 195* 266*  BUN 33*   < > 25*   < > 21 19  --  _1 CREATININE 2.55*   < > 1.67*   < > 1.33* 1.16  --  1.38* 1.30* 1.31* 1.20  CALCIUM 8.4*   < > 8.3*   < > 8.2* 7.6*  --  7.9* 7.6* 7.9* 7.9*  MG 2.5*  --  2.6*  --  2.4  --   --  2.4  --   --  2.2  PHOS 2.7   < > 3.1   < > 1.5* 3.2  --  2.3* 1.5*  --  3.0   < > = values in this interval not displayed.    Liver Function Tests: Recent Labs  Lab 10/22/21 0406 10/22/21 1529 10/23/21 0450 10/23/21 1543 10/24/21 0332  AST 38  --   --   --   --   ALT 22  --   --   --    --   ALKPHOS 137*  --   --   --   --   BILITOT 1.0  --   --   --   --   PROT 6.2*  --   --   --   --   ALBUMIN 2.4*  2.4* 2.4* 2.4* 2.4* 2.4*   No results for input(s): "LIPASE", "AMYLASE" in the last 168 hours. No results for input(s): "AMMONIA" in the last 168 hours.  CBC: Recent Labs  Lab 10/20/21 0454 10/21/21 0403 10/22/21 0406 10/22/21 2354 10/23/21 0450 10/24/21 0332  WBC 19.9* 18.5* 18.6*  --  23.2* 21.8*  HGB 8.7* 8.9* 8.5* 9.2* 8.5* 8.2*  HCT 27.4* 28.1* 28.0* 27.0* 27.2* 26.7*  MCV 92.3 91.8 94.6  --  95.1 95.0  PLT 502* 495* 497*  --  488* 429*    Cardiac Enzymes: Recent Labs  Lab 10/23/21 1432  CKTOTAL 407*    BNP: BNP (last 3 results) Recent Labs    12/24/20 0651 09/28/21 0405  BNP 412.0* 826.6*    ProBNP (last 3 results) No results for input(s): "PROBNP" in the last 8760 hours.    Other results:  Imaging: ECHOCARDIOGRAM LIMITED  Result Date: 10/23/2021    ECHOCARDIOGRAM LIMITED REPORT   Patient Name:   [name] [facility] Date of Exam: 10/23/2021 Medical Rec #:  [id number]    Height:       67.0 in Accession #:    [id number]   Weight:       201.9 lb Date of Birth:  1960-04-26    BSA:          2.031 m Patient Age:    22 years     BP:           108/44 mmHg Patient Gender: M            HR:           56 bpm. Exam Location:  Inpatient Procedure: Limited Echo, Cardiac Doppler, Color Doppler and Intracardiac            Opacification Agent Indications:    CHF  History:        Patient has prior history of Echocardiogram examinations, most  recent 10/07/2021. Cardiomyopathy, Pericardial Disease,                 Arrythmias:Cardiac Arrest; Risk Factors:Hypertension, Diabetes                 and Current Smoker.  Sonographer:    Harvie Junior Referring Phys: [phone] [facility]  Sonographer Comments: Technically difficult study due to poor echo windows, no parasternal window, no subcostal window and patient is obese. IMPRESSIONS  1. Left ventricular  ejection fraction, by estimation, is 65 to 70%. The left ventricle has normal function. The left ventricle has no regional wall motion abnormalities. There is mild concentric left ventricular hypertrophy. Left ventricular diastolic parameters are consistent with Grade I diastolic dysfunction (impaired relaxation).  2. Right ventricular systolic function is mildly reduced. The right ventricular size is normal.  3. Aortic valve regurgitation is trivial. No aortic stenosis is present.  4. The mitral valve was not well visualized. No evidence of mitral valve regurgitation.  5. Technically very difficult echo, limited images available. FINDINGS  Left Ventricle: Left ventricular ejection fraction, by estimation, is 65 to 70%. The left ventricle has normal function. The left ventricle has no regional wall motion abnormalities. Definity contrast agent was given IV to delineate the left ventricular  endocardial borders. The left ventricular internal cavity size was normal in size. There is mild concentric left ventricular hypertrophy. Left ventricular diastolic parameters are consistent with Grade I diastolic dysfunction (impaired relaxation). Right Ventricle: The right ventricular size is normal. Right ventricular systolic function is mildly reduced. Left Atrium: Left atrial size was normal in size. Right Atrium: Right atrial size was normal in size. Mitral Valve: The mitral valve was not well visualized. Tricuspid Valve: The tricuspid valve is not well visualized. Aortic Valve: Aortic valve regurgitation is trivial. Aortic regurgitation PHT measures 792 msec. No aortic stenosis is present. Aortic valve mean gradient measures 3.0 mmHg. Aortic valve peak gradient measures 6.0 mmHg. IAS/Shunts: No atrial level shunt detected by color flow Doppler.  LV Volumes (MOD) LV vol d, MOD A2C: 89.3 ml  Diastology LV vol d, MOD A4C: 160.0 ml LV e' medial:    6.96 cm/s LV vol s, MOD A2C: 59.0 ml  LV E/e' medial:  10.5 LV vol s, MOD A4C:  104.0 ml LV e' lateral:   7.83 cm/s LV SV MOD A2C:     30.3 ml  LV E/e' lateral: 9.4 LV SV MOD A4C:     160.0 ml LV SV MOD BP:      42.1 ml                              3D Volume EF:                             3D EF:        23 %                             LV EDV:       159 ml                             LV ESV:       123 ml  LV SV:        36 ml RIGHT VENTRICLE RV S prime:     10.50 cm/s TAPSE (M-mode): 1.2 cm LEFT ATRIUM           Index LA Vol (A4C): 40.8 ml 20.09 ml/m  AORTIC VALVE AV Vmax:           122.00 cm/s AV Vmean:          83.200 cm/s AV VTI:            0.208 m AV Peak Grad:      6.0 mmHg AV Mean Grad:      3.0 mmHg LVOT Vmax:         101.00 cm/s LVOT Vmean:        61.200 cm/s LVOT VTI:          0.195 m LVOT/AV VTI ratio: 0.94 AI PHT:            792 msec MITRAL VALVE MV Area (PHT): 3.23 cm    SHUNTS MV Decel Time: 235 msec    Systemic VTI: 0.20 m MV E velocity: 73.30 cm/s MV A velocity: 73.70 cm/s MV E/A ratio:  0.99 Dalton McleanMD Electronically signed by Franki Monte Signature Date/Time: 10/23/2021/4:28:53 PM    Final    DG Chest Port 1 View  Result Date: 10/23/2021 CLINICAL DATA:  A 61 year old male presents with chest pain and shortness of breath. History of stroke. EXAM: PORTABLE CHEST 1 VIEW COMPARISON:  October 21, 2021 FINDINGS: RIGHT IJ dialysis catheter tip in the upper superior vena cava similar to prior study. Tracheostomy tube in stable position, tip between clavicular heads. Feeding tube and gastric tube coursing into the abdomen. Feeding tube tip in the descending duodenum. Gastric tube tip in the stomach. Post median sternotomy as before. Stable cardiomediastinal contours. Signs of percutaneous coronary intervention as well. Decreased aeration at the LEFT lung base with potential reaccumulating pleural fluid in the LEFT lower chest since previous imaging. No pneumothorax. RIGHT chest is clear. On limited assessment no acute skeletal findings.  IMPRESSION: 1. Decreased aeration/developing airspace process with potential LEFT pleural effusion with reaccumulation since previous imaging. 2. Stable support devices. Electronically Signed   By: Zetta Bills M.D.   On: 10/23/2021 09:37     Medications:     Scheduled Medications:  sodium chloride   Intravenous Once   acetaminophen (TYLENOL) oral liquid 160 mg/5 mL  650 mg Per Tube Q6H   ascorbic acid  250 mg Per Tube BID   aspirin  81 mg Per Tube Daily   Chlorhexidine Gluconate Cloth  6 each Topical Daily   clonazePAM  0.5 mg Per Tube Q8H   collagenase   Topical Daily   darbepoetin (ARANESP) injection - NON-DIALYSIS  60 mcg Subcutaneous Q Mon-1800   docusate  100 mg Per Tube BID   feeding supplement (PROSource TF20)  60 mL Per Tube TID   folic acid  1 mg Per Tube Daily   heparin injection (subcutaneous)  5,000 Units Subcutaneous Q8H   insulin aspart  0-20 Units Subcutaneous Q4H   insulin glargine-yfgn  10 Units Subcutaneous QHS   midodrine  15 mg Per Tube TID WC   multivitamin  1 tablet Per Tube QHS   mouth rinse  15 mL Mouth Rinse 4 times per day   oxyCODONE  20 mg Per Tube Q6H   pantoprazole (PROTONIX) IV  40 mg Intravenous Q12H   polyethylene glycol  17 g Per Tube Daily  QUEtiapine  50 mg Per Tube BID   sennosides  5 mL Per Tube QHS   sodium chloride flush  10-40 mL Intracatheter Q12H   sodium chloride flush  3 mL Intravenous Q12H   thiamine  100 mg Per Tube Daily   zinc sulfate  220 mg Per Tube Daily    Infusions:   prismasol BGK 4/2.5 500 mL/hr at 10/23/21 2338    prismasol BGK 4/2.5 600 mL/hr at 10/24/21 0817   sodium chloride Stopped (10/23/21 1841)   sodium chloride     albumin human Stopped (10/15/21 1901)   amiodarone Stopped (10/22/21 2247)   DAPTOmycin (CUBICIN) 900 mg in sodium chloride 0.9 % IVPB Stopped (10/23/21 1329)   dexmedetomidine (PRECEDEX) IV infusion 0.7 mcg/kg/hr (10/24/21 7026)   epinephrine 9 mcg/min (10/24/21 0800)   feeding supplement  (VITAL 1.5 CAL) 60 mL/hr at 10/24/21 0800   meropenem (MERREM) IV Stopped (10/24/21 3785)   micafungin (MYCAMINE) 200 mg in sodium chloride 0.9 % 100 mL IVPB Stopped (10/23/21 1456)   norepinephrine (LEVOPHED) Adult infusion Stopped (10/23/21 0234)   prismasol BGK 4/2.5 1,800 mL/hr at 10/24/21 0744   vasopressin 0.03 Units/min (10/24/21 0800)    PRN Medications: sodium chloride, albumin human, camphor-menthol, dextrose, diphenhydrAMINE, heparin, hydrALAZINE, HYDROmorphone (DILAUDID) injection, HYDROmorphone (DILAUDID) injection, ipratropium-albuterol, midazolam, ondansetron (ZOFRAN) IV, mouth rinse, polyvinyl alcohol, sodium chloride flush, sodium chloride flush   Assessment/Plan:    1. Shock - mixed cardiogenic/hemorrhagic -> VA ECMO -> decannulated on 8/28 to Impella 5.5 - Echo 08/28: Underfilled LV, EF 55-60% with severe LVH and near normal RV systsystolic function.  - Impella extracted 9/7 - Concern for worsening sepsis with leukocytosis and increasing pressor requirements on 09/13. Purulent drainage from Aline, replaced. - BC X 2 NGTD, tracheal aspirate pending. Procalcitonin 1.63.  - Given prolonged abx course earlier in stay, addition of Daptomycin, Micafungin and meropenem recommended per ID.  - CO-OX 48%. Recheck pending. CVP 9-10. Volume managed with CRRT. Now pulling for net even to slightly positive d/t concern for low volume status - Bedside echo 09/13 - LV function preserved, RV mildly reduced. Small clot in posterior pericardium but no hemodynamic effect  2. Cardiac arrest (PEA/bradycardic) - 8/19 in setting of tamponade - PEA arrest again on 08/28  3. Cardiac tamponade with emergent bedside sternotomy  - Diffuse epicardial bleeding with post-op Dresslers - return to OR 8/21 and 8/24 for washouts.  - Washout 8/28 in OR. Multiple units of blood products in OR.   - Chest closed on 9/2 - Hgb 8.2 today  4. Acute hypoxemic respiratory failure - Off ECMO.  - Vent support  per CCM - Perc Trach placed 9/7  - He has completed course of Vanc + meropenem.  - s/p bronchoscopy w/ BAL 9/4.  - tracheal aspirate 09/13 pending. Back on IV abx as above d/t concern for sepsis  5. AKI due to ATN - CRRT started 08/28.  - Remains on CRRT, currently pulling for net even to slightly positive as above. 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 Now off colchicine with CRRT.  7. 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 Continue aspirin 81 mg daily  8. DM2 - continue SSI   9. PAF/AFL - Tolerates poorly.  - Recurrent AFL. S/p DCCV  to SR 08/29.  - SR/sinus brady today - Off amio d/t bradycardia - Not on AC for now d/t bleeding, DVT dose heparin. Hgb now stable.  10. ID -  Completed vancomycin/meropenem with open chest.  - Now with concern for worsening sepsis. See discussion in #1  11. Neuro -Follows commands off sedation  12. FEN - TFs ongoing  - Still with a lot of NG output - Feeding tube in duodenum on CXR yesterday  13. Hyperkalemia - resolved with RRT.     Continue working with PT. Will likely need LTACH at discharge    Victoria, LINDSAY N, PA-C  8:30 AM  Agree with above.   Remains on vent through trach. On epi 9 and VP.  Co-ox low. Remains on CRRT keeping even. Restless. Uncomfortable  General:  Awake on vent through trach. restless HEENT: normal Neck: supple. JVP 10. + trach Carotids 2+ bilat; no bruits. No lymphadenopathy or thryomegaly appreciated. YJW:[id number] wound ok Regular rate & rhythm. No rubs, gallops or murmurs. Lungs: clear Abdomen: soft, nontender, nondistended. No hepatosplenomegaly. No bruits or masses. Good bowel sounds. Extremities: no cyanosis, clubbing, rash, edema Neuro: restless will follow some commands   Remains very tenuous. EF normal on echo but output low. Will hold CVVHD. Continue broad spectrum. Follow coox closely.   CRITICAL CARE Performed by: Glori Bickers  Total  critical care time: 35 minutes  Critical care time was exclusive of separately billable procedures and treating other patients.  Critical care was necessary to treat or prevent imminent or life-threatening deterioration.  Critical care was time spent personally by me (independent of midlevel providers or residents) on the following activities: development of treatment plan with patient and/or surrogate as well as nursing, discussions with consultants, evaluation of patient's response to treatment, examination of patient, obtaining history from patient or surrogate, ordering and performing treatments and interventions, ordering and review of laboratory studies, ordering and review of radiographic studies, pulse oximetry and re-evaluation of patient's condition.  Glori Bickers, MD  6:06 PM

## 2021-10-24 NOTE — Inpatient Diabetes Management (Signed)
Inpatient Diabetes Program Recommendations  AACE/ADA: New Consensus Statement on Inpatient Glycemic Control (2015)  Target Ranges:  Prepandial:   less than 140 mg/dL      Peak postprandial:   less than 180 mg/dL (1-2 hours)      Critically ill patients:  140 - 180 mg/dL   Lab Results  Component Value Date   GLUCAP 149 (H) 10/24/2021   HGBA1C 6.5 (H) 09/10/2021    Review of Glycemic Control  Latest Reference Range & Units 10/23/21 20:06 10/23/21 23:44 10/24/21 03:55 10/24/21 08:27  Glucose-Capillary 70 - 99 mg/dL 189 (H) 210 (H) 227 (H) 149 (H)  (H): Data is abnormally high Diabetes history: Type 2 DM Outpatient Diabetes medications: Trulicity 1.5 mg qwk, Basaglar 71 units QD Current orders for Inpatient glycemic control: Semglee 10 units QHS, Novolog 0-20 units Q4H Vital 60 ml/hr (tolerating per MD note)  CRRT  Inpatient Diabetes Program Recommendations:    Consider: -Adding Novolog 3 units Q4H for tube feed coverage (to be stopped or held in the event tube feeds are stopped).   Thanks, Bronson Curb, MSN, RNC-OB Diabetes Coordinator [phone] (8a-5p)

## 2021-10-24 NOTE — Procedures (Signed)
I evaluated the patient on CRRT. I have reviewed the last 24 hours/events and made appropriate changes.   Filed Weights   10/22/21 0645 10/23/21 0615 10/24/21 0500  Weight: 91.7 kg 91.6 kg 94.3 kg    Recent Labs  Lab 10/24/21 0332  NA 138  K 4.0  CL 105  CO2 23  GLUCOSE 266*  BUN 19  CREATININE 1.20  CALCIUM 7.9*  PHOS 3.0    Recent Labs  Lab 10/22/21 0406 10/22/21 2354 10/23/21 0450 10/24/21 0332  WBC 18.6*  --  23.2* 21.8*  HGB 8.5* 9.2* 8.5* 8.2*  HCT 28.0* 27.0* 27.2* 26.7*  MCV 94.6  --  95.1 95.0  PLT 497*  --  488* 429*    Scheduled Meds:  sodium chloride   Intravenous Once   acetaminophen (TYLENOL) oral liquid 160 mg/5 mL  650 mg Per Tube Q6H   ascorbic acid  250 mg Per Tube BID   aspirin  81 mg Per Tube Daily   Chlorhexidine Gluconate Cloth  6 each Topical Daily   clonazePAM  0.5 mg Per Tube Q8H   collagenase   Topical Daily   darbepoetin (ARANESP) injection - NON-DIALYSIS  60 mcg Subcutaneous Q Mon-1800   docusate  100 mg Per Tube BID   feeding supplement (PROSource TF20)  60 mL Per Tube TID   folic acid  1 mg Per Tube Daily   heparin injection (subcutaneous)  5,000 Units Subcutaneous Q8H   insulin aspart  0-20 Units Subcutaneous Q4H   insulin glargine-yfgn  10 Units Subcutaneous QHS   midodrine  15 mg Per Tube TID WC   multivitamin  1 tablet Per Tube QHS   mouth rinse  15 mL Mouth Rinse 4 times per day   oxyCODONE  20 mg Per Tube Q6H   pantoprazole (PROTONIX) IV  40 mg Intravenous Q12H   polyethylene glycol  17 g Per Tube Daily   QUEtiapine  50 mg Per Tube BID   sennosides  5 mL Per Tube QHS   sodium chloride flush  10-40 mL Intracatheter Q12H   sodium chloride flush  3 mL Intravenous Q12H   thiamine  100 mg Per Tube Daily   zinc sulfate  220 mg Per Tube Daily   Continuous Infusions:   prismasol BGK 4/2.5 500 mL/hr at 10/24/21 1001    prismasol BGK 4/2.5 600 mL/hr at 10/24/21 0817   sodium chloride Stopped (10/23/21 1841)   sodium chloride      albumin human Stopped (10/15/21 1901)   amiodarone Stopped (10/22/21 2247)   DAPTOmycin (CUBICIN) 900 mg in sodium chloride 0.9 % IVPB Stopped (10/23/21 1329)   dexmedetomidine (PRECEDEX) IV infusion 0.5 mcg/kg/hr (10/24/21 1000)   epinephrine 10 mcg/min (10/24/21 1000)   feeding supplement (VITAL 1.5 CAL) 60 mL/hr at 10/24/21 1000   meropenem (MERREM) IV Stopped (10/24/21 3570)   micafungin (MYCAMINE) 200 mg in sodium chloride 0.9 % 100 mL IVPB Stopped (10/23/21 1456)   norepinephrine (LEVOPHED) Adult infusion Stopped (10/23/21 0234)   prismasol BGK 4/2.5 1,800 mL/hr at 10/24/21 1038   vasopressin 0.03 Units/min (10/24/21 1000)   PRN Meds:.sodium chloride, albumin human, camphor-menthol, dextrose, diphenhydrAMINE, heparin, hydrALAZINE, HYDROmorphone (DILAUDID) injection, ipratropium-albuterol, midazolam, ondansetron (ZOFRAN) IV, mouth rinse, polyvinyl alcohol, sodium chloride flush, sodium chloride flush   Santiago Bumpers,  MD 10/24/2021, 11:06 AM

## 2021-10-24 NOTE — Progress Notes (Signed)
NAME:  [name], MRN:  [id number], DOB:  1960/12/11, LOS: 65 ADMISSION DATE:  09/28/2021, CONSULTATION DATE:  09/29/2021 REFERRING MD:  Kipp Brood - TCTS CHIEF COMPLAINT: Dyspnea   History of Present Illness:  61 year old man who underwent CABG on 8/3, readmitted on 8/19 in setting of dyspnea with a pericardial effusion requiring pericardial window via bedside sternotomy. Also with bilateral pleural effusions. Sustained PEA arrest on 8/20, briefly required [facility] ECMO which was discontinued on 8/28, Impella placed 8/28, started on CRRT.  Hypoxemia, repeat cardiac arrest on 8/30. Underwent mediastinal washout/sternotomy closure 9/2. Impella removed and tracheostomy completed 9/7.  Pertinent Medical History:  CAD s/p CABG 8/3 DM2 Diverticulosis GERD Hyperlipidemia Hypertension History of stroke Tobacco Abuse  Significant Hospital Events: Including procedures, antibiotic start and stop dates in addition to other pertinent events   8/19 Admitted, s/p pericardial window 8/20 PEA cardiac arrest, left pigtail chest tube placement, PEA cardiac arrest due to tamponade, bedside sternotomy with pericardial drains placed. 8/21 Return to OR for overnight bleeding.  8/22 Minimal chest tube output.  Atrial fibrillation, controled with amiodarone.  8/22 TEE showed EF 35% at baseline, which improved significantly with decreasing ECMO flow.  8/23 Tolerated diuresis  8/24 Mediastinal washout, removal of hematoma 8/25 Ongoing bleeding issues from posterior mediastinum 8/28 Decannulated and off ECMO, 5.5 Impella placed, L femoral HD cath placed and started on CRRT 8/30 Hypoxia and hypotension followed by brief arrest, gentle L lateral chest compressions performed and ROSC in roughly 1 minute. 8/31 Remains deeply sedated on ventilator with open chest.  No acute events overnight. 9/1 No acute events overnight, remains on ventilator deep sedation.  Tolerating aggressive volume removal per CRRT 9/2 Sternotomy  closure 9/3 Wean versed 9/4 FOB, low grade temps, notable thick mucus/frequent suctioning 9/5 Vomiting, ?mucus plugging. PSV 5-6 hours on 10/5 9/6 Impella down to p3 9/7 Impella removal and trach. PRBC and DDAVP in OR  9/8 Low dose epi, weaned on 8/8 9/9 RIJ HD catheter placed 9/11 Started on ATC at 28% and tolerating well 9/12 Remains on 28% ATC 9/13 SB/Junction rhythm with associated hypotension overnight requiring transient pressor escalation. Pressor requirement down in AM. WBC count 23 (18), Co-ox 36%. Resp/Blood Cx. 9/14 Mild bradycardia/relative hypotension with Precedex. Remains on Epi 54mcg, Vaso 0.03. Resp Cx with few gram+ cocci (clusters), yeast. BCx NGTD. WBC slightly improved on Dapto/Zosyn (vanc d/c given possible VRE). Co-ox 48%.  Interim History / Subjective:  Mild bradycardia/relative hypotension with Precedex Remains on Epi 91mcg, vaso 0.03 Resp Cx with few gram + cocci in clusters, yeast; BCx NGTD WBC 21 (23) on Dapto/Zosyn Co-ox 48%  Objective   Blood pressure (!) 102/55, pulse 60, temperature (!) 96.8 F (36 C), temperature source Axillary, resp. rate (!) 21, height 5\' 7"  (1.702 m), weight 94.3 kg, SpO2 99 %. CVP:  [8 mmHg-10 mmHg] 8 mmHg  FiO2 (%):  [28 %] 28 %   Intake/Output Summary (Last 24 hours) at 10/24/2021 0727 Last data filed at 10/24/2021 0700 Gross per 24 hour  Intake 4395.94 ml  Output 4091 ml  Net 304.94 ml    Filed Weights   10/22/21 0645 10/23/21 0615 10/24/21 0500  Weight: 91.7 kg 91.6 kg 94.3 kg   Physical Examination: General: Acutely ill-appearing middle-aged man in NAD. Appears uncomfortable/mildly agitated. HEENT: [place]/AT, anicteric sclera, PERRL, moist mucous membranes. Tracheostomy in place with scant thick secretions. Neuro:  Awake, appears oriented. Nodding appropriately to questions.  Responds to verbal stimuli. Following commands intermittently. Moves all  4 extremities spontaneously. CV: Bradycardic in 60s, no m/g/r. PULM:  Breathing even and unlabored on ATC (5L, FiO2 28%). Lung fields coarse bilaterally, slightly improved from prior. GI: Soft, nontender, nondistended. Normoactive bowel sounds. Extremities: Bilateral symmetric 1-2+ LE edema noted. Skin: Warm/dry, no rashes.  Resolved Hospital Problem List:   Acute RV failure > resolved  Assessment & Plan:   Acute metabolic encephalopathy Required Dilaudid gtt discontinuation 9/13 early AM for hypotension/bradycardia. - Continue Precedex gtt as HR tolerates - Dilaudid PRN for dressing changes - Continue oxycodone VT - Continue scheduled Klonopin, Seroquel - Goal RASS 0 to -1 - Delirium precautions - PT/OT/SLP as clinically appropriate  Hemorrhagic pericarditis with tamponade PEA arrest due to tamponade, s/p emergent sternotomy Cardiogenic & hemorrhagic shock, s/p VA ECMO (decannulated 8/29), s/p Impella (removed 9/7) CAD s/p CABG x3 Afib  Bedside Echo completed 9/12PM with preserved BiV function, trace pericardial effusion. Overnight 9/12-9/13 entered into SB then junctional rhythm with associated hypotension. Amio gtt stopped. - Continue Epi gtt titrated to goal MAP (> 65) - Continue vasopressin - Continue midodrine - Amiodarone discontinued in the setting of bradycaridia/transient junctional rhythm - ASA/statin - F/u Co-ox - Remainder per Cards/TCTS  Leukocytosis WBC increase from 18 to 23 9/12 - 9/13. CXR with worsened aeration/developing airspace process, ?L pleural effusion reaccumulation. - Trend WBC - Resp Cx with few gram positive cocci in clusters/yeast, f/u final Cx - Blood Cx 9/13 NGTD - Zosyn 9/13 > - Vanc transitioned to Dapto 9/13 (c/f ?VRE) >  Acute respiratory failure with hypoxemia and hypercarbia S/p Tracheostomy  - Tolerating ATC since 9/11 - Continue supplemental O2 support - Wean FiO2 for O2 sat > 90%; current ATC 28% FiO2 - VAP bundle - Routine trach care, ok for suture removal 9/14 - Pulmonary hygiene (CPT, nebs;  ongoing intermittent mucus plugging) - PAD protocol for sedation: Precedex and Dilaudid for goal RASS 0 to -1  AKI with anuria  Hypophosphatemia - Nephro following, appreciate assistance - Continue CRRT - Trend BMP - Replete electrolytes as indicated - Monitor I&Os - Avoid nephrotoxic agents as able  ABLA, previous hemorrhagic shock Thrombocytosis  - Trend H&H - Monitor for signs of active bleeding - Transfuse for Hgb < 7.0 or hemodynamically significant bleeding  Maculopapular rash- only new thing is banatrol. No history of food allergies. Banatrol stopped 9/10. - Continue to monitor - Benadryl PRN  Vomiting Diarrhea, improving - NGT to ILWS - TF via Cortrak - Hold bowel regimen PRN  DM II with hyperglycemia - Basal Semglee 10U daily, consider increase - SSI, resistant scale - CBGs Q4H - Goal CBG 140-180  At Risk Malnutrition  - TF via Cortrak  Lines/Tubes/Drains - FMS (8/25)  - Trach (9/7) - L Fem HD cath (8/28 > 9/8) - LIJ CVC (8/20 > 9/10)  - L rad art line (8/19 > 9/13) - R rad art line (9/13 > ) - RIJ HD catheter (9/9 > )  Best Practice (right click and "Reselect all SmartList Selections" daily)  Diet/type: tubefeeds  DVT prophylaxis: prophylactic heparin  GI prophylaxis: PPI Lines: Central line, Dialysis Catheter, and Arterial Line Foley:  N/A Code Status:  full code Last date of multidisciplinary goals of care discussion [Per Primary]  Critical care time:   The patient is critically ill with multiple organ system failure and requires high complexity decision making for assessment and support, frequent evaluation and titration of therapies, advanced monitoring, review of radiographic studies and interpretation of complex data.   Critical  Care Time devoted to patient care services, exclusive of separately billable procedures, described in this note is 36 minutes.  Lestine Mount, PA-C  Pulmonary & Critical Care 10/24/21 7:27 AM  Please  see [email or web address] for pager details.  From 7A-7P if no response, please call [phone] After hours, please call ELink [phone]

## 2021-10-24 NOTE — Progress Notes (Signed)
      [address]       [place] [address]             [phone]      BP (!) 117/53 Comment: aline reading  Pulse (!) 54   Temp (!) 96.8 F (36 C) (Axillary)   Resp (!) 23   Ht 5\' 7"  (1.702 m)   Wt 94.3 kg   SpO2 100%   BMI 32.56 kg/m    Intake/Output Summary (Last 24 hours) at 10/24/2021 1744 Last data filed at 10/24/2021 1700 Gross per 24 hour  Intake 3824.04 ml  Output 3418 ml  Net 406.04 ml    Antibiotics broadened yesterday  Continue current measures  Remo Lipps C. Roxan Hockey, MD [facility] [phone]

## 2021-10-25 LAB — RENAL FUNCTION PANEL
Albumin: 2.2 g/dL — ABNORMAL LOW (ref 3.5–5.0)
Albumin: 2.2 g/dL — ABNORMAL LOW (ref 3.5–5.0)
Anion gap: 11 (ref 5–15)
Anion gap: 6 (ref 5–15)
BUN: 15 mg/dL (ref 8–23)
BUN: 13 mg/dL (ref 8–23)
CO2: 23 mmol/L (ref 22–32)
CO2: 24 mmol/L (ref 22–32)
Calcium: 8.1 mg/dL — ABNORMAL LOW (ref 8.9–10.3)
Calcium: 7.7 mg/dL — ABNORMAL LOW (ref 8.9–10.3)
Chloride: 105 mmol/L (ref 98–111)
Chloride: 108 mmol/L (ref 98–111)
Creatinine, Ser: 0.98 mg/dL (ref 0.61–1.24)
Creatinine, Ser: 1.12 mg/dL (ref 0.61–1.24)
GFR, Estimated: >60 mL/min (ref >60)
GFR, Estimated: >60 mL/min (ref >60)
Glucose, Bld: 165 mg/dL — ABNORMAL HIGH (ref 70–99)
Glucose, Bld: 122 mg/dL — ABNORMAL HIGH (ref 70–99)
Phosphorus: 4.2 mg/dL (ref 2.5–4.6)
Phosphorus: 1.8 mg/dL — ABNORMAL LOW (ref 2.5–4.6)
Potassium: 4.1 mmol/L (ref 3.5–5.1)
Potassium: 4.2 mmol/L (ref 3.5–5.1)
Sodium: 139 mmol/L (ref 135–145)
Sodium: 138 mmol/L (ref 135–145)

## 2021-10-25 LAB — CBC
HCT: 24.9 % — ABNORMAL LOW (ref 39.0–52.0)
Hemoglobin: 7.6 g/dL — ABNORMAL LOW (ref 13.0–17.0)
MCH: 28.9 pg (ref 26.0–34.0)
MCHC: 30.5 g/dL (ref 30.0–36.0)
MCV: 94.7 fL (ref 80.0–100.0)
Platelets: 392 10*3/uL (ref 150–400)
RBC: 2.63 MIL/uL — ABNORMAL LOW (ref 4.22–5.81)
RDW: 17.7 % — ABNORMAL HIGH (ref 11.5–15.5)
WBC: 19.1 10*3/uL — ABNORMAL HIGH (ref 4.0–10.5)
nRBC: 2 % — ABNORMAL HIGH (ref 0.0–0.2)

## 2021-10-25 LAB — COOXEMETRY PANEL
Carboxyhemoglobin: 1.6 % — ABNORMAL HIGH (ref 0.5–1.5)
Carboxyhemoglobin: 1.7 % — ABNORMAL HIGH (ref 0.5–1.5)
Methemoglobin: 3.4 % — ABNORMAL HIGH (ref 0.0–1.5)
Methemoglobin: 2.8 % — ABNORMAL HIGH (ref 0.0–1.5)
O2 Saturation: 51.7 %
O2 Saturation: 47.3 %
Total hemoglobin: 8.4 g/dL — ABNORMAL LOW (ref 12.0–16.0)
Total hemoglobin: 7.8 g/dL — ABNORMAL LOW (ref 12.0–16.0)

## 2021-10-25 LAB — GLUCOSE, CAPILLARY
Glucose-Capillary: 132 mg/dL — ABNORMAL HIGH (ref 70–99)
Glucose-Capillary: 151 mg/dL — ABNORMAL HIGH (ref 70–99)
Glucose-Capillary: 153 mg/dL — ABNORMAL HIGH (ref 70–99)
Glucose-Capillary: 162 mg/dL — ABNORMAL HIGH (ref 70–99)
Glucose-Capillary: 178 mg/dL — ABNORMAL HIGH (ref 70–99)
Glucose-Capillary: 208 mg/dL — ABNORMAL HIGH (ref 70–99)

## 2021-10-25 LAB — VITAMIN B6: Vitamin B6: 8.2 ug/L (ref 3.4–65.2)

## 2021-10-25 LAB — HEMOGLOBIN AND HEMATOCRIT, BLOOD
HCT: 24.4 % — ABNORMAL LOW (ref 39.0–52.0)
Hemoglobin: 7.7 g/dL — ABNORMAL LOW (ref 13.0–17.0)

## 2021-10-25 LAB — VITAMIN A: Vitamin A (Retinoic Acid): 31.3 ug/dL (ref 22.0–69.5)

## 2021-10-25 LAB — PROCALCITONIN: Procalcitonin: 2.77 ng/mL

## 2021-10-25 LAB — LACTIC ACID, PLASMA: Lactic Acid, Venous: 1.4 mmol/L (ref 0.5–1.9)

## 2021-10-25 LAB — VITAMIN C: Vitamin C: 0.3 mg/dL — ABNORMAL LOW (ref 0.4–2.0)

## 2021-10-25 LAB — MAGNESIUM: Magnesium: 2.3 mg/dL (ref 1.7–2.4)

## 2021-10-25 MED ORDER — SODIUM PHOSPHATES 45 MMOLE/15ML IV SOLN
30.0000 mmol | Freq: Once | INTRAVENOUS | Status: AC
  Administered 2021-10-25: 30 mmol via INTRAVENOUS
  Filled 2021-10-25: qty 10

## 2021-10-25 MED ORDER — DARBEPOETIN ALFA 100 MCG/0.5ML IJ SOSY
100.0000 ug | PREFILLED_SYRINGE | INTRAMUSCULAR | Status: DC
  Administered 2021-10-28 – 2021-11-04 (×2): 100 ug via SUBCUTANEOUS
  Filled 2021-10-25 (×3): qty 0.5

## 2021-10-25 MED ORDER — INSULIN ASPART 100 UNIT/ML IJ SOLN
2.0000 [IU] | INTRAMUSCULAR | Status: DC
  Administered 2021-10-25 – 2021-11-19 (×118): 2 [IU] via SUBCUTANEOUS

## 2021-10-25 MED ORDER — NOREPINEPHRINE 16 MG/250ML-% IV SOLN
0.0000 ug/min | INTRAVENOUS | Status: DC
  Administered 2021-10-25: 2 ug/min via INTRAVENOUS
  Administered 2021-10-26: 3 ug/min via INTRAVENOUS
  Administered 2021-10-27 – 2021-11-02 (×2): 2 ug/min via INTRAVENOUS
  Filled 2021-10-25 (×4): qty 250

## 2021-10-25 NOTE — Progress Notes (Signed)
[address]       [place] [address]             [phone]      8 Days Post-Op  Procedure(s) (LRB): REMOVAL OF IMPELLA LEFT VENTRICULAR ASSIST DEVICE (Right) TRANSESOPHAGEAL ECHOCARDIOGRAM (TEE) (N/A) PERCUTANEOUS TRACHEOSTOMY USING SHILEY FLEXIBLE 8 mm CUFFED TRACH. (N/A)   Total Length of Stay:  LOS: 27 days    SUBJECTIVE:wound dressing changed today and with no signs of supportive source and intact Tolerating CRRT  Vitals:   10/25/21 1515 10/25/21 1530  BP:    Pulse: 69 66  Resp: (!) 27 (!) 27  Temp:    SpO2: 100% 100%    Intake/Output      09/14 0701 09/15 0700 09/15 0701 09/16 0700   I.V. (mL/kg) 1126 (12) 378.6 (4)   Other 20    NG/GT 1698 750   IV Piggyback 740.9 211.9   Total Intake(mL/kg) 3584.9 (38.3) 1340.4 (14.3)   Emesis/NG output 600 500   Stool 850 500   CRRT 681 572   Total Output 2131 1572   Net +1453.9 -231.6             prismasol BGK 4/2.5 500 mL/hr at 10/25/21 0938    prismasol BGK 4/2.5 600 mL/hr at 10/25/21 0938   sodium chloride Stopped (10/23/21 1841)   sodium chloride     albumin human Stopped (10/15/21 1901)   amiodarone Stopped (10/22/21 2247)   DAPTOmycin (CUBICIN) 900 mg in sodium chloride 0.9 % IVPB Stopped (10/24/21 2106)   dexmedetomidine (PRECEDEX) IV infusion Stopped (10/25/21 1538)   epinephrine 2 mcg/min (10/25/21 1700)   feeding supplement (VITAL 1.5 CAL) 60 mL/hr at 10/25/21 1700   meropenem (MERREM) IV Stopped (10/25/21 1330)   micafungin (MYCAMINE) 200 mg in sodium chloride 0.9 % 100 mL IVPB Stopped (10/25/21 1234)   norepinephrine (LEVOPHED) Adult infusion 5 mcg/min (10/25/21 1700)   prismasol BGK 4/2.5 1,800 mL/hr at 10/25/21 1212   vasopressin 0.04 Units/min (10/25/21 1700)    CBC    Component Value Date/Time   WBC 19.1 (H) 10/25/2021 0339   RBC 2.63 (L) 10/25/2021 0339   HGB 7.7 (L) 10/25/2021 1500   HGB 15.4 02/13/2021 0855   HCT 24.4 (L) 10/25/2021 1500   HCT 45.7  02/13/2021 0855   PLT 392 10/25/2021 0339   PLT 239 02/13/2021 0855   MCV 94.7 10/25/2021 0339   MCV 88 02/13/2021 0855   MCH 28.9 10/25/2021 0339   MCHC 30.5 10/25/2021 0339   RDW 17.7 (H) 10/25/2021 0339   RDW 11.3 (L) 02/13/2021 0855   LYMPHSABS 2.2 09/29/2021 1956   MONOABS 2.1 (H) 09/29/2021 1956   EOSABS 0.0 09/29/2021 1956   BASOSABS 0.0 09/29/2021 1956   CMP     Component Value Date/Time   NA 138 10/25/2021 1450   NA 135 02/13/2021 0855   K 4.2 10/25/2021 1450   CL 108 10/25/2021 1450   CO2 24 10/25/2021 1450   GLUCOSE 122 (H) 10/25/2021 1450   BUN 13 10/25/2021 1450   BUN 9 02/13/2021 0855   CREATININE 1.12 10/25/2021 1450   CALCIUM 7.7 (L) 10/25/2021 1450   PROT 6.2 (L) 10/22/2021 0406   ALBUMIN 2.2 (L) 10/25/2021 1450   AST 38 10/22/2021 0406   ALT 22 10/22/2021 0406   ALKPHOS 137 (H) 10/22/2021 0406   BILITOT 1.0 10/22/2021 0406   GFRNONAA >60 10/25/2021 1450   GFRAA >60 08/25/2019 2110  ABG    Component Value Date/Time   PHART 7.301 (L) 10/22/2021 2354   PCO2ART 38.5 10/22/2021 2354   PO2ART 73 (L) 10/22/2021 2354   HCO3 19.3 (L) 10/22/2021 2354   TCO2 20 (L) 10/22/2021 2354   ACIDBASEDEF 7.0 (H) 10/22/2021 2354   O2SAT 47.3 10/25/2021 1504   CBG (last 3)  Recent Labs    10/25/21 0743 10/25/21 1140 10/25/21 1649  GLUCAP 153* 208* 162*     ASSESSMENT: Will try and wean Epi tonight.  No other issues Hopefully if down on inotropes can consider removal of art line in near future   Coralie Common, MD @DATE @

## 2021-10-25 NOTE — Progress Notes (Signed)
NAME:  [name], MRN:  [id number], DOB:  1960-10-31, LOS: 20 ADMISSION DATE:  09/28/2021, CONSULTATION DATE:  09/29/2021 REFERRING MD:  Kipp Brood - TCTS CHIEF COMPLAINT: Dyspnea   History of Present Illness:  61 year old man who underwent CABG on 8/3, readmitted on 8/19 in setting of dyspnea with a pericardial effusion requiring pericardial window via bedside sternotomy. Also with bilateral pleural effusions. Sustained PEA arrest on 8/20, briefly required VA ECMO which was discontinued on 8/28, Impella placed 8/28, started on CRRT.  Hypoxemia, repeat cardiac arrest on 8/30. Underwent mediastinal washout/sternotomy closure 9/2. Impella removed and tracheostomy completed 9/7.  Pertinent Medical History:  CAD s/p CABG 8/3 DM2 Diverticulosis GERD Hyperlipidemia Hypertension History of stroke Tobacco Abuse  Significant Hospital Events: Including procedures, antibiotic start and stop dates in addition to other pertinent events   8/19 Admitted, s/p pericardial window 8/20 PEA cardiac arrest, left pigtail chest tube placement, PEA cardiac arrest due to tamponade, bedside sternotomy with pericardial drains placed. 8/21 Return to OR for overnight bleeding.  8/22 Minimal chest tube output.  Atrial fibrillation, controled with amiodarone.  8/22 TEE showed EF 35% at baseline, which improved significantly with decreasing ECMO flow.  8/23 Tolerated diuresis  8/24 Mediastinal washout, removal of hematoma 8/25 Ongoing bleeding issues from posterior mediastinum 8/28 Decannulated and off ECMO, 5.5 Impella placed, L femoral HD cath placed and started on CRRT 8/30 Hypoxia and hypotension followed by brief arrest, gentle L lateral chest compressions performed and ROSC in roughly 1 minute. 8/31 Remains deeply sedated on ventilator with open chest.  No acute events overnight. 9/1 No acute events overnight, remains on ventilator deep sedation.  Tolerating aggressive volume removal per CRRT 9/2 Sternotomy  closure 9/3 Wean versed 9/4 FOB, low grade temps, notable thick mucus/frequent suctioning 9/5 Vomiting, ?mucus plugging. PSV 5-6 hours on 10/5 9/6 Impella down to p3 9/7 Impella removal and trach. PRBC and DDAVP in OR  9/8 Low dose epi, weaned on 8/8 9/9 RIJ HD catheter placed 9/11 Started on ATC at 28% and tolerating well 9/12 Remains on 28% ATC 9/13 SB/Junction rhythm with associated hypotension overnight requiring transient pressor escalation. Pressor requirement down in AM. WBC count 23 (18), Co-ox 36%. Resp/Blood Cx. 9/14 Mild bradycardia/relative hypotension with Precedex. Remains on Epi 13mcg, Vaso 0.03. Resp Cx with few gram+ cocci (clusters), yeast. BCx NGTD. 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 Co-ox 48%. CRRT stopped. 9/15 WBC continues to downtrend. Hgb 7.6. Net +1.49L/24H. CRRT resumed. Co-ox 51%. NE started to help with Epi wean.  Interim History / Subjective:  No significant events overnight Appears uncomfortable, endorses pain/discomfort CRRT paused yesterday Net +1.49L/24H, CRRT resumed today Remains on Epi 52mcg, vaso 0.03 Plan to begin low-dose NE to help with Epi wean, ?sepsis Ongoing Dapto/Meropenem/Mica WBC downtrending Hgb 7.6 Co-ox 51%  Objective:  Blood pressure (!) 117/53, pulse 70, temperature 97.8 F (36.6 C), temperature source Axillary, resp. rate (!) 23, height 5\' 7"  (1.702 m), weight 93.6 kg, SpO2 100 %. CVP:  [6 mmHg-12 mmHg] 10 mmHg  FiO2 (%):  [28 %] 28 %   Intake/Output Summary (Last 24 hours) at 10/25/2021 0740 Last data filed at 10/25/2021 0700 Gross per 24 hour  Intake 3584.93 ml  Output 2094 ml  Net 1490.93 ml    Filed Weights   10/23/21 0615 10/24/21 0500 10/25/21 0500  Weight: 91.6 kg 94.3 kg 93.6 kg   Physical Examination: General: Acutely ill-appearing middle-aged man in NAD. Appears uncomfortable. HEENT: [place]/AT, anicteric sclera,  PERRL, moist mucous membranes. Trach in place. Neuro:  Awake,  unable to assess orientation but nodding appropriately/mouthing words to questions.  Responds to verbal stimuli. Following commands intermittently. Moves all 4 extremities spontaneously. CV: RRR, no m/g/r. PULM: Mildly tachypneic, breathing unlabored on ATC (5L, FiO2 28%). Lung fields with bilateral coarse rhonchi, diminished at bilateral bases. GI: Soft, nontender, nondistended. Hypoactive bowel sounds. Extremities: Bilateral symmetric 1+ LE edema noted. Skin: Warm/dry, no rashes.  Resolved Hospital Problem List:   Acute RV failure > resolved  Assessment & Plan:   Acute metabolic encephalopathy Required Dilaudid gtt discontinuation 9/13 early AM for hypotension/bradycardia. - Continue Precedex gtt as HR tolerates - Dilaudid as needed for dressing changes - Continue oxycodone VT - Continue scheduled Klonopin, Seroquel - Goal RASS 0 to -1 - Delirium precautions - PT/OT/SLP as clinically appropriate  Hemorrhagic pericarditis with tamponade PEA arrest due to tamponade, s/p emergent sternotomy Cardiogenic & hemorrhagic shock, s/p VA ECMO (decannulated 8/29), s/p Impella (removed 9/7) CAD s/p CABG x3 Afib  Bedside Echo completed 9/12PM with preserved BiV function, trace pericardial effusion. Overnight 9/12-9/13 entered into SB then junctional rhythm with associated hypotension. Amio gtt stopped. - Continue Epi gtt titrated to goal MAP (>65) - Trial low-dose NE to allow for Epi weaning - Continue vasopressin - Continue midodrine - Amiodarone discontinued in the setting of bradycardia/transient junctional rhythm - ASA/statin - Trend Co-ox - Remainder per HF/TCTS  Leukocytosis WBC increase from 18 to 23 9/12 - 9/13. CXR with worsened aeration/developing airspace process, ?L pleural effusion reaccumulation. Resp Cx 9/13 with few gram positive cocci in clusters/yeast. - Trend WBC - Follow-up finalized Resp Cx - Blood Cx NGTD - Vanc transition to Dapto c/f VRE (9/13 > ) - Meropenem  started (9/14 > ) - Micafungin started (9/14 > ) - Zosyn discontinued (9/13 > 9/14)  Acute respiratory failure with hypoxemia and hypercarbia S/p Tracheostomy  - Tolerating ATC since 9/11 - Continue supplemental O2 support - Wean FiO2 for O2 sat > 90%, current ATC 28% FiO2 - VAP bundle - Routine trach care - Pulmonary hygiene (CPT/nebs, ongoing intermittent mucus plugging) - PAD protocol for sedation: Precedex and Dilaudid for goal RASS 0 to -1  AKI with anuria  Hypophosphatemia - Nephro following, appreciate assistance - CRRT per nephrology, goal net even to slightly net positive - Trend BMP - Replete electrolytes as indicated - Monitor I&Os - Avoid nephrotoxic agents as able  ABLA, previous hemorrhagic shock Thrombocytosis  - Trend H&H - Monitor for signs of active bleeding - Transfuse for Hgb < 7.0 or hemodynamically significant bleeding - Consider threshold of Hgb 8.0 given patient's history of CAD, defer to heart failure/TCTS for transfusion decision  Maculopapular rash- only new thing is banatrol. No history of food allergies. Banatrol stopped 9/10. - Continue to monitor - Benadryl as needed  Vomiting Diarrhea, improving - NGT to ILWS - TF via Cotrak - Hold bowel regimen as clinically appropriate  DM II with hyperglycemia - Basal Semglee 10U daily, consider increase if glucoses remain uncontrolled - SSI, resistant scale - CBGs Q4H - Goal CBG 140-180  At Risk Malnutrition  - TF via Cortrak  Sacral pressure wound - WOC following  Lines/Tubes/Drains - FMS (8/25)  - Trach (9/7) - L Fem HD cath (8/28 > 9/8) - LIJ CVC (8/20 > 9/10)  - L rad art line (8/19 > 9/13) - R rad art line (9/13 > ) - RIJ HD catheter (9/9 > )  Best Practice (right click and "  Reselect all SmartList Selections" daily)  Diet/type: tubefeeds  DVT prophylaxis: prophylactic heparin  GI prophylaxis: PPI Lines: Central line, Dialysis Catheter, and Arterial Line Foley:  N/A Code  Status:  full code Last date of multidisciplinary goals of care discussion [Per Primary]  Critical care time:   The patient is critically ill with multiple organ system failure and requires high complexity decision making for assessment and support, frequent evaluation and titration of therapies, advanced monitoring, review of radiographic studies and interpretation of complex data.   Critical Care Time devoted to patient care services, exclusive of separately billable procedures, described in this note is 34 minutes.  Lestine Mount, PA-C [place] [facility] 10/25/21 7:40 AM  Please see [email or web address] for pager details.  From 7A-7P if no response, please call [phone] After hours, please call ELink [phone]

## 2021-10-25 NOTE — Progress Notes (Signed)
Patient seen today by trach team for consult.  No education is needed at this time.  All necessary equipment is at beside.   Will continue to follow for progression.  

## 2021-10-25 NOTE — Progress Notes (Signed)
Speech Language Pathology Treatment: [name] Speaking valve  Patient Details Name: [name] MRN: [id number] DOB: 11/22/1960 Today's Date: 10/25/2021 Time: 1470-9295 SLP Time Calculation (min) (ACUTE ONLY): 12 min  Assessment / Plan / Recommendation Clinical Impression  Pt was seen for ongoing PMV trials. He is alert today but still easily distracted. He wore the valve for ~5 minutes today before he started to show evidence of back pressure. Cues were given to try to cough and clear what sounded like audible secretions but he was not able to do this today (recently suctioned per RN). He could produce very soft, hoarse phonation at the phoneme level (primarily when saying /a/) and occasionally at the word level, but he did not have the breath support to sustain phonation much more than this. Pt will continue to benefit from SLP f/u with PMV recommended to be used with SLP only still.   HPI [name], bradycardia and hypotension. Pt with bil pleural effusion and pericardial effusion s/p VATS with pericardial window 8/19. 8/20 cardiac tamponade with arrest,  intubated with bedside mediastinal exploration and ECMO via fem access. OR 8/21 for reexploration due to mediastinal hemorrhage. 8/28 OR for washout with ECMO decannulation and Impella placed. 8/29 brief PEA arrest with DCCV to NSR. 9/2 chest closure. 9/7 trach and Impella removed.9/11 VAC removed. PMHx: CAD s/p recent CABG x5 (admitted  8/3-11/23 with post op Afib), CKD stage II, DM, HTN, AKI, Afib and CVA      SLP Plan  Continue with current plan of care      Recommendations for follow up therapy are one component of a multi-disciplinary discharge planning process, led by the attending physician.  Recommendations may be updated based on patient status, additional functional criteria and insurance authorization.    Recommendations         Patient may use Passy-Muir Speech Valve: with SLP only          Oral Care Recommendations: Oral care QID Follow Up Recommendations: SLP at Long-term acute care hospital Assistance recommended at discharge: Frequent or constant Supervision/Assistance SLP Visit Diagnosis: Aphonia (R49.1) Plan: Continue with current plan of care           [name]., M.A. [place] Office [phone]  Secure chat preferred   10/25/2021, 12:51 PM

## 2021-10-25 NOTE — Progress Notes (Signed)
RT NOTE: Trach sutures removed per protocol and CCM order. Trach care performed and trach ties changed and secured with assistance of RN. Vitals are stable. RT will continue to monitor.

## 2021-10-25 NOTE — TOC Initial Note (Signed)
Transition of Care [facility]) - Initial/Assessment Note    Patient Details  Name: [name] MRN: [id number] Date of Birth: Nov 10, 1960  [facility]) CM/SW Contact:    Erenest Rasher, RN Phone Number: [phone] 10/25/2021, 12:53 PM  Clinical Narrative:                  HF TOC CM spoke to pt at bedside. Pt was able to give permission to speak to ex-wife. Contacted ex-wife, Colette. Left message for return call. Will continue to follow pt for dc needs. Referral sent to Financial counselor to follow up on Medicaid. Will continue to follow for dc needs.     Expected Discharge Plan: IP Rehab Facility Barriers to Discharge: Continued Medical Work up   Patient Goals and CMS Choice        Expected Discharge Plan and Services Expected Discharge Plan: [place]     Prior Living Arrangements/Services     Activities of Daily Living Home Assistive Devices/Equipment: CBG Meter, Eyeglasses ADL Screening (condition at time of admission) Patient's cognitive ability adequate to safely complete daily activities?: Yes Is the patient deaf or have difficulty hearing?: No Does the patient have difficulty seeing, even when wearing glasses/contacts?: No Does the patient have difficulty concentrating, remembering, or making decisions?: No Patient able to express need for assistance with ADLs?: Yes Does the patient have difficulty dressing or bathing?: No Independently performs ADLs?: Yes (appropriate for developmental age) Does the patient have difficulty walking or climbing stairs?: No Weakness of Legs: None Weakness of Arms/Hands: None  Permission Sought/Granted   Permission granted to share information with : Yes, Verbal Permission Granted  Share Information with NAME: Demontay Grantham     Permission granted to share info w Relationship: ex wife  Permission granted to share info w Contact Information: [phone]  Emotional Assessment       Orientation: :  Oriented to Self      Admission diagnosis:  Symptomatic bradycardia [R00.1] Cardiomyopathy ([place]) [I42.9] S/P pericardial window creation [Z98.890] Personal history of ECMO [Z92.81] Patient Active Problem List   Diagnosis Date Noted   Acute metabolic encephalopathy    Acute anemia    Pressure injury of skin 10/13/2021   Cardiogenic shock ([place]) 10/12/2021   Pericarditis    Acute respiratory failure with hypoxia (HCC)    Atypical atrial flutter ([place])    Personal history of ECMO 10/07/2021   Cardiac arrest ([place])    Cardiomyopathy ([place]) 09/28/2021   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   Transient neurologic deficit    Aphasia    Dysphasia 03/12/2021   Current use of insulin ([place]) 03/12/2021   Encephalomalacia    History of cerebral embolism with cerebral infarction 12/28/2020   AKI (acute kidney injury) ([place])    History of MI (myocardial infarction) 03/19/2018   Status 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 3 03/23/2015   Diabetes mellitus type 2 with neurological manifestations ([place])    Tobacco abuse    Obesity    Hypertension associated with diabetes ([place]) 03/16/2015   Hyperlipidemia associated with type 2 diabetes mellitus ([place]) 03/16/2015   PCP:  [name], MD Pharmacy:   [place] [facility] [address], [place] [address] Phone: [phone] Fax: [place] [address] [place] [address] Phone: [phone] Fax: [phone]  Social Determinants of Health (SDOH) Interventions    Readmission Risk Interventions    09/20/2021   11:57 AM  Readmission Risk Prevention Plan  Post Dischage Appt Complete  Medication Screening Complete  Transportation Screening Complete

## 2021-10-25 NOTE — Progress Notes (Addendum)
Patient ID: [name], male   DOB: Jun 11, 1960, 61 y.o.   MRN: [id number]   Advanced Heart Failure Rounding Note   Subjective:    - 8/19 Pericardial window - 8/20 Cardiac arrest with tamponade -> Emergent bedside washout - 09/29/21 VA Cannulation - 09/30/21 Return to OR for mediastinal hemorrhage - 10/01/21 Developed AF -> amio - 10/02/21 TEE EF 25-30%  - 10/04/21 OR for washout. C/b continued bleeding overnight - 10/07/21 Placement of Impella 5.5 with washout, VA ECMO decannulation. Hypotensive with development of severe RV dysfunction after ECMO off and pressors titrated up. Multiple units of blood products. - 10/08/21 Brief PEA arrest. AFL with RVR >> S/p DCCV to SR, back in AFL shortly after - 8/31 Give 1UPRBCs  - 9/2 OR for chest closure - 9/3 Hypotensive overnight w/ SBPs in 80s. Febrile, mTemp 100.8. CRRT paused. VP increased to 0.04.  - 9/4 s/p bronchoscopy w/ BAL by PCCM, Cx NGTD  - 9/5 vomiting w/ large volume NGT output + watery/foul diarrhea, Tube feeds held. No signs of ileus on KUB. C-diff negative   - 9/7 OR for Impella Extraction and percutaneous tracheostomy. 2 u RBCs. - 9/8 CVVH stopped and line removed for holiday - 9/9 CVVH restarted.  -9/13 Worsening leukocytosis and pressor requirements. Purulent drainage from arterial line. Started Daptomycin, Micafungin and Meropenem.  Today, on Epi 8 + VP 0.03.   CO-OX 52%. MAPs ~65.   CRRT clotted off this morning. CVP 10-12.   Continues on IV abx. WBCs 18>23>22>>19K. BC X 2 with NGTD, tracheal aspirate growing few GPC and rare yeast   Remains restless. Having a lot of pain at sight of sacral wound   Echo 08/28: LV EF 55-60% with severe LVH, small ventricular cavity, RV appears to have near normal systolic function   Objective:     Vital Signs:   Temp:  [96 F (35.6 C)-98.6 F (37 C)] 98.6 F (37 C) (09/15 0800) Pulse Rate:  [53-71] 70 (09/15 0750) Resp:  [15-31] 23 (09/15 0750) SpO2:  [100 %] 100 % (09/15  0750) FiO2 (%):  [28 %] 28 % (09/15 0750) Weight:  [93.6 kg] 93.6 kg (09/15 0500) Last BM Date : 10/24/21  Weight change: Filed Weights   10/23/21 0615 10/24/21 0500 10/25/21 0500  Weight: 91.6 kg 94.3 kg 93.6 kg    Intake/Output:   Intake/Output Summary (Last 24 hours) at 10/25/2021 0933 Last data filed at 10/25/2021 0700 Gross per 24 hour  Intake 3177.95 ml  Output 1640 ml  Net 1537.95 ml   CVP 10-12  General:  fatigued appearing male, no distress Restless, appears uncomfortable. HEENT: + trach, + NG + cor trak  Neck: supple. JVP 10 cm. + rt IJ HD cath. Carotids 2+ bilat; no bruits.  Cor: PMI nondisplaced. Regular rate & rhythm. No rubs, gallops or murmurs. Sternum closed Lungs: course BS bilaterally  Abdomen: soft, nontender, nondistended. + bowel sounds  Extremities: no cyanosis, clubbing, rash, edema Neuro: Awake and following some commands   Telemetry: NSR 70s, personally reviewed    Labs: Basic Metabolic Panel: Recent Labs  Lab 10/21/21 0403 10/21/21 1659 10/22/21 0406 10/22/21 1529 10/23/21 0450 10/23/21 1543 10/23/21 2000 10/24/21 0332 10/24/21 1600 10/25/21 0339  NA 137   < > 136   < > 136 137 136 138 138 139  K 4.3   < > 3.8   < > 4.9 4.6 4.3 4.0 4.3 4.1  CL 102   < > 104   < >  105 106 106 105 105 105  CO2 22   < > 24   < > 20* _0 GLUCOSE 169*   < > 205*   < > 285* 254* 195* 266* 157* 165*  BUN 25*   < > 21   < > _1 CREATININE 1.67*   < > 1.33*   < > 1.38* 1.30* 1.31* 1.20 0.99 0.98  CALCIUM 8.3*   < > 8.2*   < > 7.9* 7.6* 7.9* 7.9* 8.0* 8.1*  MG 2.6*  --  2.4  --  2.4  --   --  2.2  --  2.3  PHOS 3.1   < > 1.5*   < > 2.3* 1.5*  --  3.0 1.7* 4.2   < > = values in this interval not displayed.    Liver Function Tests: Recent Labs  Lab 10/22/21 0406 10/22/21 1529 10/23/21 0450 10/23/21 1543 10/24/21 0332 10/24/21 1600 10/25/21 0339  AST 38  --   --   --   --   --   --   ALT 22  --   --   --   --   --   --    ALKPHOS 137*  --   --   --   --   --   --   BILITOT 1.0  --   --   --   --   --   --   PROT 6.2*  --   --   --   --   --   --   ALBUMIN 2.4*  2.4*   < > 2.4* 2.4* 2.4* 2.4* 2.2*   < > = values in this interval not displayed.   No results for input(s): "LIPASE", "AMYLASE" in the last 168 hours. No results for input(s): "AMMONIA" in the last 168 hours.  CBC: Recent Labs  Lab 10/21/21 0403 10/22/21 0406 10/22/21 2354 10/23/21 0450 10/24/21 0332 10/25/21 0339  WBC 18.5* 18.6*  --  23.2* 21.8* 19.1*  HGB 8.9* 8.5* 9.2* 8.5* 8.2* 7.6*  HCT 28.1* 28.0* 27.0* 27.2* 26.7* 24.9*  MCV 91.8 94.6  --  95.1 95.0 94.7  PLT 495* 497*  --  488* 429* 392    Cardiac Enzymes: Recent Labs  Lab 10/23/21 1432  CKTOTAL 407*    BNP: BNP (last 3 results) Recent Labs    12/24/20 0651 09/28/21 0405  BNP 412.0* 826.6*    ProBNP (last 3 results) No results for input(s): "PROBNP" in the last 8760 hours.    Other results:  Imaging: DG Abd Portable 1V  Result Date: 10/24/2021 CLINICAL DATA:  Feeding tube placement EXAM: PORTABLE ABDOMEN - 1 VIEW COMPARISON:  10/21/2021 FINDINGS: Nasogastric tube with the tip projecting over the stomach. Feeding tube with the tip projecting over the duodenal jejunal flexure. No bowel dilatation to suggest obstruction. No evidence of pneumoperitoneum, portal venous gas or pneumatosis. No pathologic calcifications along the expected course of the ureters. No acute osseous abnormality. IMPRESSION: 1. Nasogastric tube with the tip projecting over the stomach. Feeding tube with the tip projecting over the duodenal jejunal flexure. Electronically Signed   By: [name] M.D.   On: 10/24/2021 12:45   ECHOCARDIOGRAM LIMITED  Result Date: 10/23/2021    ECHOCARDIOGRAM LIMITED REPORT   Patient Name:   [name] [name] [name] Date of Exam: 10/23/2021 Medical Rec #:  [id number]    Height:  67.0 in Accession #:    [id number]   Weight:       201.9 lb Date of Birth:  1960-03-25     BSA:          2.031 m Patient Age:    51 years     BP:           108/44 mmHg Patient Gender: M            HR:           56 bpm. Exam Location:  Inpatient Procedure: Limited Echo, Cardiac Doppler, Color Doppler and Intracardiac            Opacification Agent Indications:    CHF  History:        Patient has prior history of Echocardiogram examinations, most                 recent 10/07/2021. Cardiomyopathy, Pericardial Disease,                 Arrythmias:Cardiac Arrest; Risk Factors:Hypertension, Diabetes                 and Current Smoker.  Sonographer:    Harvie Junior Referring Phys: [phone] [facility]  Sonographer Comments: Technically difficult study due to poor echo windows, no parasternal window, no subcostal window and patient is obese. IMPRESSIONS  1. Left ventricular ejection fraction, by estimation, is 65 to 70%. The left ventricle has normal function. The left ventricle has no regional wall motion abnormalities. There is mild concentric left ventricular hypertrophy. Left ventricular diastolic parameters are consistent with Grade I diastolic dysfunction (impaired relaxation).  2. Right ventricular systolic function is mildly reduced. The right ventricular size is normal.  3. Aortic valve regurgitation is trivial. No aortic stenosis is present.  4. The mitral valve was not well visualized. No evidence of mitral valve regurgitation.  5. Technically very difficult echo, limited images available. FINDINGS  Left Ventricle: Left ventricular ejection fraction, by estimation, is 65 to 70%. The left ventricle has normal function. The left ventricle has no regional wall motion abnormalities. Definity contrast agent was given IV to delineate the left ventricular  endocardial borders. The left ventricular internal cavity size was normal in size. There is mild concentric left ventricular hypertrophy. Left ventricular diastolic parameters are consistent with Grade I diastolic dysfunction (impaired relaxation).  Right Ventricle: The right ventricular size is normal. Right ventricular systolic function is mildly reduced. Left Atrium: Left atrial size was normal in size. Right Atrium: Right atrial size was normal in size. Mitral Valve: The mitral valve was not well visualized. Tricuspid Valve: The tricuspid valve is not well visualized. Aortic Valve: Aortic valve regurgitation is trivial. Aortic regurgitation PHT measures 792 msec. No aortic stenosis is present. Aortic valve mean gradient measures 3.0 mmHg. Aortic valve peak gradient measures 6.0 mmHg. IAS/Shunts: No atrial level shunt detected by color flow Doppler.  LV Volumes (MOD) LV vol d, MOD A2C: 89.3 ml  Diastology LV vol d, MOD A4C: 160.0 ml LV e' medial:    6.96 cm/s LV vol s, MOD A2C: 59.0 ml  LV E/e' medial:  10.5 LV vol s, MOD A4C: 104.0 ml LV e' lateral:   7.83 cm/s LV SV MOD A2C:     30.3 ml  LV E/e' lateral: 9.4 LV SV MOD A4C:     160.0 ml LV SV MOD BP:      42.1 ml  3D Volume EF:                             3D EF:        23 %                             LV EDV:       159 ml                             LV ESV:       123 ml                             LV SV:        36 ml RIGHT VENTRICLE RV S prime:     10.50 cm/s TAPSE (M-mode): 1.2 cm LEFT ATRIUM           Index LA Vol (A4C): 40.8 ml 20.09 ml/m  AORTIC VALVE AV Vmax:           122.00 cm/s AV Vmean:          83.200 cm/s AV VTI:            0.208 m AV Peak Grad:      6.0 mmHg AV Mean Grad:      3.0 mmHg LVOT Vmax:         101.00 cm/s LVOT Vmean:        61.200 cm/s LVOT VTI:          0.195 m LVOT/AV VTI ratio: 0.94 AI PHT:            792 msec MITRAL VALVE MV Area (PHT): 3.23 cm    SHUNTS MV Decel Time: 235 msec    Systemic VTI: 0.20 m MV E velocity: 73.30 cm/s MV A velocity: 73.70 cm/s MV E/A ratio:  0.99 Dalton McleanMD Electronically signed by Franki Monte Signature Date/Time: 10/23/2021/4:28:53 PM    Final      Medications:     Scheduled Medications:  sodium chloride    Intravenous Once   acetaminophen (TYLENOL) oral liquid 160 mg/5 mL  650 mg Per Tube Q6H   ascorbic acid  250 mg Per Tube BID   aspirin  81 mg Per Tube Daily   Chlorhexidine Gluconate Cloth  6 each Topical Daily   clonazePAM  0.5 mg Per Tube Q8H   collagenase   Topical Daily   darbepoetin (ARANESP) injection - NON-DIALYSIS  60 mcg Subcutaneous Q Mon-1800   docusate  100 mg Per Tube BID   feeding supplement (PROSource TF20)  60 mL Per Tube TID   folic acid  1 mg Per Tube Daily   heparin injection (subcutaneous)  5,000 Units Subcutaneous Q8H   insulin aspart  0-20 Units Subcutaneous Q4H   insulin glargine-yfgn  10 Units Subcutaneous QHS   midodrine  15 mg Per Tube TID WC   multivitamin  1 tablet Per Tube QHS   mouth rinse  15 mL Mouth Rinse 4 times per day   oxyCODONE  20 mg Per Tube Q6H   pantoprazole (PROTONIX) IV  40 mg Intravenous Q12H   polyethylene glycol  17 g Per Tube Daily   QUEtiapine  50 mg Per Tube BID   sennosides  5 mL Per Tube QHS   sodium chloride  flush  10-40 mL Intracatheter Q12H   sodium chloride flush  3 mL Intravenous Q12H   thiamine  100 mg Per Tube Daily   zinc sulfate  220 mg Per Tube Daily    Infusions:   prismasol BGK 4/2.5 500 mL/hr at 10/24/21 2037    prismasol BGK 4/2.5 600 mL/hr at 10/25/21 0238   sodium chloride Stopped (10/23/21 1841)   sodium chloride     albumin human Stopped (10/15/21 1901)   amiodarone Stopped (10/22/21 2247)   DAPTOmycin (CUBICIN) 900 mg in sodium chloride 0.9 % IVPB Stopped (10/24/21 2106)   dexmedetomidine (PRECEDEX) IV infusion 0.5 mcg/kg/hr (10/25/21 0700)   epinephrine 8 mcg/min (10/25/21 0700)   feeding supplement (VITAL 1.5 CAL) 60 mL/hr at 10/25/21 0700   meropenem (MERREM) IV 200 mL/hr at 10/25/21 0700   micafungin (MYCAMINE) 200 mg in sodium chloride 0.9 % 100 mL IVPB Stopped (10/24/21 1341)   norepinephrine (LEVOPHED) Adult infusion Stopped (10/23/21 0234)   prismasol BGK 4/2.5 1,800 mL/hr at 10/25/21 0639    vasopressin 0.04 Units/min (10/25/21 0925)    PRN Medications: sodium chloride, albumin human, camphor-menthol, dextrose, diphenhydrAMINE, heparin, hydrALAZINE, HYDROmorphone (DILAUDID) injection, ipratropium-albuterol, midazolam, ondansetron (ZOFRAN) IV, mouth rinse, polyvinyl alcohol, sodium chloride flush, sodium chloride flush   Assessment/Plan:    1. Shock - mixed cardiogenic/hemorrhagic -> VA ECMO -> decannulated on 8/28 to Impella 5.5 - Echo 08/28: Underfilled LV, EF 55-60% with severe LVH and near normal RV systsystolic function.  - Impella extracted 9/7 - Concern for worsening sepsis with leukocytosis and increasing pressor requirements on 09/13. Purulent drainage from Aline, replaced. - BC X 2 NGTD, tracheal aspirate pending. Procalcitonin 1.63.  - Given prolonged abx course earlier in stay, addition of Daptomycin, Micafungin and meropenem recommended per ID.  - On Epi 8 + VP 0.03 w/ marginal co-ox (52%) and MAP (~65). If hemodynamics remain marginal, may need addition of NE for additional inotropic and BP support. For now will increase VP to 0.04 given suspected predominate septic shock component  - CVP 10-12. Volume managed with CRRT (clotted off this morning). Management per nephrology. Aim to keep CVP 10-12 in setting of sepsis   - Bedside echo 09/13 - LV function preserved, RV mildly reduced. Small clot in posterior pericardium but no hemodynamic effect  2. Cardiac arrest (PEA/bradycardic) - 8/19 in setting of tamponade - PEA arrest again on 08/28  3. Cardiac tamponade with emergent bedside sternotomy  - Diffuse epicardial bleeding with post-op Dresslers - return to OR 8/21 and 8/24 for washouts.  - Washout 8/28 in OR. Multiple units of blood products in OR.   - Chest closed on 9/2 - Hgb 7.6 today. Transfusion threshold per CT surgery.   4. Acute hypoxemic respiratory failure - Off ECMO.  - Vent support per CCM - Perc Trach placed 9/7  - He has completed course of Vanc  + meropenem.  - s/p bronchoscopy w/ BAL 9/4.  - tracheal aspirate 09/13 growing few GPC and rare yeast  - Back on IV abx as above d/t concern for sepsis  5. AKI due to ATN - CRRT started 08/28.  - Remains on anurics and on CRRT, nephrology following   6. 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 Now off colchicine with CRRT.  7. 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 Continue aspirin 81 mg daily  8. DM2 - continue SSI   9. PAF/AFL - Tolerates poorly.  - Recurrent AFL. S/p DCCV  to SR  08/29.  - Off amio d/t bradycardia - maintaining NSR  - Not on AC for now d/t bleeding, DVT dose heparin.   10. ID - Completed vancomycin/meropenem with open chest.  - Now with concern for worsening sepsis. See discussion in #1  11. Neuro -Follows commands off sedation  12. FEN - TFs ongoing  - Still with a lot of NG output - Feeding tube in duodenum on CXR  13. Hyperkalemia - resolved with RRT.   Lyda Jester, PA-C  9:33 AM --------------------------------------------------------  Patient seen with PA, agree with the above note.   Subjective: No significant events overnight; stable hemodynamics.    Exam: General: AAM; critically ill; responds to commands HEENT: Normal.  Neck: JVP 10-12cm H2O Lungs: Clear to auscultation bilaterally with normal respiratory effort. CV: tachycardic; S1S2 not audible Abdomen: soft; active abdominal sounds Neurologic: responds to verbal stimuli; moving upper extremities  Psych: Normal affect. Extremities: No clubbing or cyanosis.    A/P Unfortunately no significant improvement in the past 24H; this AM CVP 10-12; eFick 2.4L/min/m2. Rising pressor requirement over the past 12h with addition of levophed this AM. I am concerned this is due to septic shock. Repeat lactate/mixed venous & procalcitonin pending. Cultures remain negative. Day 3 of dapto/merrem/micafungin. Will continue to closely monitor through today.    [name] 1:41 PM   I have personally spent 35 minutes of critical care time, exclusive of time spent on any procedures, in evaluation and management of this critically ill patient's condition.

## 2021-10-25 NOTE — Progress Notes (Signed)
Follow up - Critical Care Medicine Note  Patient Details:    [name] is an 61 y.o. male.   Subjective:    Overnight Issues: having ongoing NGT output                                Comfortable and tolerating trach collar                                 Still on Epi  Objective:  Vital signs for last 24 hours: Temp:  [96 F (35.6 C)-97.8 F (36.6 C)] 97.8 F (36.6 C) (09/15 0400) Pulse Rate:  [53-71] 70 (09/15 0750) Resp:  [15-31] 23 (09/15 0750) SpO2:  [99 %-100 %] 100 % (09/15 0750) FiO2 (%):  [28 %] 28 % (09/15 0750) Weight:  [93.6 kg] 93.6 kg (09/15 0500)  Hemodynamic parameters for last 24 hours: CVP:  [10 mmHg-12 mmHg] 10 mmHg  Intake/Output from previous day: 09/14 0701 - 09/15 0700 In: 3584.9 [I.V.:1126; ZD/GL:8756; IV Piggyback:740.9] Out: 2094 [Emesis/NG output:600; Stool:850]  Intake/Output this shift: No intake/output data recorded.  Vent settings for last 24 hours: FiO2 (%):  [28 %] 28 %  Physical Exam:  Awaiting sternal dressing change to evaluate wound Awake and cooperative Aline in place  Assessment/Plan:    LOS: 27 days   Additional comments:WBC slightly down to 19K                                    Will monitor wound closely                                    Hopefully continue to wean epi as tolerated                                    PT/OT  Critical Care Total Time*: 15 Minutes  Afton Mikelson 10/25/2021  *Care during the described time interval was provided by me and/or other providers on the critical care team.  I have reviewed this patient's available data, including medical history, events of note, physical examination and test results as part of my evaluation.

## 2021-10-25 NOTE — Progress Notes (Signed)
[facility] Progress Note  Name: [name] MRN: [id number] DOB: 07-Mar-1960   Subjective:  Clotted CRRT this morning.  Was on for about 48 hours.  CVP remains 10-12.  Having some pain around sacral wound but otherwise no significant problems.  Tolerated CRRT yesterday about 1.3 L positive.  Remains on pressors    Intake/Output Summary (Last 24 hours) at 10/25/2021 1017 Last data filed at 10/25/2021 1000 Gross per 24 hour  Intake 3453.42 ml  Output 2407 ml  Net 1046.42 ml    Vitals:  Vitals:   10/25/21 0915 10/25/21 0930 10/25/21 0945 10/25/21 1000  BP:      Pulse: 75 74 74 73  Resp: (!) 25 (!) 25 (!) 22 (!) 28  Temp:      TempSrc:      SpO2: 98% 99% 99% 99%  Weight:      Height:         Physical Exam:          General lying in bed, ill appearing HEENT normocephalic atraumatic   Neck trach in place on TC Lungs coarse mechanical breath sounds, no rales, bilateral chest rise Heart no rub, normal rate Abdomen soft nontender nondistended Extremities trace edema in hips  Neuro - nods to questioning Access: RIJ nontunneled catheter    Medications reviewed   Labs:     Latest Ref Rng & Units 10/25/2021    3:39 AM 10/24/2021    4:00 PM 10/24/2021    3:32 AM  BMP  Glucose 70 - 99 mg/dL 165  157  266   BUN 8 - 23 mg/dL 15  15  19    Creatinine 0.61 - 1.24 mg/dL 0.98  0.99  1.20   Sodium 135 - 145 mmol/L 139  138  138   Potassium 3.5 - 5.1 mmol/L 4.1  4.3  4.0   Chloride 98 - 111 mmol/L 105  105  105   CO2 22 - 32 mmol/L 23  26  23    Calcium 8.9 - 10.3 mg/dL 8.1  8.0  7.9      Assessment/Plan:   Pt is a 61 y.o. yo male  with history of HTN, HLD, DM, A-fib, stroke, CKD, CAD multivessel dz status post CABG on 8/3 presented with shortness of breath, found to have cardiac tamponade, course complicated by cardiac arrest, use of ECMO, seen as a consultation for the evaluation of AKI and fluid volume management.   #Acute kidney injury on CKD IIIa,  nonoliguric: Ischemic ATN due to cardiogenic shock/cardiac arrest and multiple hemodynamic changes.  Started CRRT on 8/28 for decreasing urine output and fluid/volume management.  Brief (less than 24 hours) line free interval after clotting and CRRT resumed  on 9/9 after nontunneled line with critical care - Continue CRRT  - on 4K Fluids  -Positive over the last 24 hours, consider increasing ultrafiltration goal - team would prefer citrate if agent needed for clotting -  No anticoagulation currently but did clot today.  If the patient starts to clot more frequently will consider starting citrate - transition to iHD as able. Vasopressor support being weaned.   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  decannulated  with placement of Impella. On colchicine per cardiology S/p closure of the sternum and mediastinal washout on 9/2.   #Cardiogenic shock/cardiac arrest: Was on ECMO before. Continue pressors per primary team.  Impella out on 9/7    #Acute hypoxic respiratory failure:  improving -  now on TC.  optimizing volume with CRRT    #Acute blood loss anemia: Monitor hemoglobin and transfuse as needed by ICU team.  Aranesp 40 mcg weekly on Mondays started on 9/4.  Increased dose to 100 mcg weekly      #Hypophosphatemia due to CRRT, replete sodium phosphate as needed  Disposition - continue ICU monitoring  Reesa Chew, MD 10/25/2021 10:17 AM

## 2021-10-26 LAB — RENAL FUNCTION PANEL
Albumin: 2.2 g/dL — ABNORMAL LOW (ref 3.5–5.0)
Albumin: 2.3 g/dL — ABNORMAL LOW (ref 3.5–5.0)
Anion gap: 9 (ref 5–15)
Anion gap: 3 — ABNORMAL LOW (ref 5–15)
BUN: 15 mg/dL (ref 8–23)
BUN: 13 mg/dL (ref 8–23)
CO2: 25 mmol/L (ref 22–32)
CO2: 25 mmol/L (ref 22–32)
Calcium: 8.2 mg/dL — ABNORMAL LOW (ref 8.9–10.3)
Calcium: 7.6 mg/dL — ABNORMAL LOW (ref 8.9–10.3)
Chloride: 102 mmol/L (ref 98–111)
Chloride: 110 mmol/L (ref 98–111)
Creatinine, Ser: 1.09 mg/dL (ref 0.61–1.24)
Creatinine, Ser: 1.16 mg/dL (ref 0.61–1.24)
GFR, Estimated: >60 mL/min (ref >60)
GFR, Estimated: >60 mL/min (ref >60)
Glucose, Bld: 250 mg/dL — ABNORMAL HIGH (ref 70–99)
Glucose, Bld: 148 mg/dL — ABNORMAL HIGH (ref 70–99)
Phosphorus: 1.7 mg/dL — ABNORMAL LOW (ref 2.5–4.6)
Phosphorus: 2.8 mg/dL (ref 2.5–4.6)
Potassium: 3.9 mmol/L (ref 3.5–5.1)
Potassium: 4.2 mmol/L (ref 3.5–5.1)
Sodium: 136 mmol/L (ref 135–145)
Sodium: 138 mmol/L (ref 135–145)

## 2021-10-26 LAB — COOXEMETRY PANEL
Carboxyhemoglobin: 2.2 % — ABNORMAL HIGH (ref 0.5–1.5)
Methemoglobin: 0.7 % (ref 0.0–1.5)
O2 Saturation: 66.5 %
Total hemoglobin: 7.7 g/dL — ABNORMAL LOW (ref 12.0–16.0)

## 2021-10-26 LAB — CULTURE, RESPIRATORY W GRAM STAIN: Culture: NORMAL

## 2021-10-26 LAB — GLUCOSE, CAPILLARY
Glucose-Capillary: 117 mg/dL — ABNORMAL HIGH (ref 70–99)
Glucose-Capillary: 132 mg/dL — ABNORMAL HIGH (ref 70–99)
Glucose-Capillary: 136 mg/dL — ABNORMAL HIGH (ref 70–99)
Glucose-Capillary: 157 mg/dL — ABNORMAL HIGH (ref 70–99)
Glucose-Capillary: 178 mg/dL — ABNORMAL HIGH (ref 70–99)
Glucose-Capillary: 184 mg/dL — ABNORMAL HIGH (ref 70–99)
Glucose-Capillary: 238 mg/dL — ABNORMAL HIGH (ref 70–99)

## 2021-10-26 LAB — CBC
HCT: 25.1 % — ABNORMAL LOW (ref 39.0–52.0)
Hemoglobin: 7.4 g/dL — ABNORMAL LOW (ref 13.0–17.0)
MCH: 28.9 pg (ref 26.0–34.0)
MCHC: 29.5 g/dL — ABNORMAL LOW (ref 30.0–36.0)
MCV: 98 fL (ref 80.0–100.0)
Platelets: 387 10*3/uL (ref 150–400)
RBC: 2.56 MIL/uL — ABNORMAL LOW (ref 4.22–5.81)
RDW: 19.2 % — ABNORMAL HIGH (ref 11.5–15.5)
WBC: 18.7 10*3/uL — ABNORMAL HIGH (ref 4.0–10.5)
nRBC: 3.3 % — ABNORMAL HIGH (ref 0.0–0.2)

## 2021-10-26 LAB — LACTIC ACID, PLASMA: Lactic Acid, Venous: 1.4 mmol/L (ref 0.5–1.9)

## 2021-10-26 LAB — MAGNESIUM: Magnesium: 2.5 mg/dL — ABNORMAL HIGH (ref 1.7–2.4)

## 2021-10-26 MED ORDER — SODIUM PHOSPHATES 45 MMOLE/15ML IV SOLN
30.0000 mmol | Freq: Once | INTRAVENOUS | Status: AC
  Administered 2021-10-26: 30 mmol via INTRAVENOUS
  Filled 2021-10-26: qty 10

## 2021-10-26 NOTE — Progress Notes (Signed)
Patient ID: DEMIR TITSWORTH, male   DOB: 11-24-1960, 61 y.o.   MRN: [id number]   Advanced Heart Failure Rounding Note   Subjective:    - 8/19 Pericardial window - 8/20 Cardiac arrest with tamponade -> Emergent bedside washout - 09/29/21 VA Cannulation - 09/30/21 Return to OR for mediastinal hemorrhage - 10/01/21 Developed AF -> amio - 10/02/21 TEE EF 25-30%  - 10/04/21 OR for washout. C/b continued bleeding overnight - 10/07/21 Placement of Impella 5.5 with washout, VA ECMO decannulation. Hypotensive with development of severe RV dysfunction after ECMO off and pressors titrated up. Multiple units of blood products. - 10/08/21 Brief PEA arrest. AFL with RVR >> S/p DCCV to SR, back in AFL shortly after - 8/31 Give 1UPRBCs  - 9/2 OR for chest closure - 9/3 Hypotensive overnight w/ SBPs in 80s. Febrile, mTemp 100.8. CRRT paused. VP increased to 0.04.  - 9/4 s/p bronchoscopy w/ BAL by PCCM, Cx NGTD  - 9/5 vomiting w/ large volume NGT output + watery/foul diarrhea, Tube feeds held. No signs of ileus on KUB. C-diff negative   - 9/7 OR for Impella Extraction and percutaneous tracheostomy. 2 u RBCs. - 9/8 CVVH stopped and line removed for holiday - 9/9 CVVH restarted.  -9/13 Worsening leukocytosis and pressor requirements. Purulent drainage from arterial line. Started Daptomycin, Micafungin and Meropenem.  Remains on CVVHD pulling even. On vent through trach.  Awake. Alert. Interactive. Pointing to letters on letter board. Epi down to 1. NE 9. VP 0.04  Less restless.   CVP:  [13 mmHg-16 mmHg] 14 mmHg   Echo 08/28: LV EF 55-60% with severe LVH, small ventricular cavity, RV appears to have near normal systolic function   Objective:     Vital Signs:   Temp:  [96.4 F (35.8 C)-97.7 F (36.5 C)] 96.5 F (35.8 C) (09/16 0803) Pulse Rate:  [58-77] 68 (09/16 1040) Resp:  [16-33] 24 (09/16 1040) BP: (112-131)/(46-61) 114/52 (09/16 0400) SpO2:  [96 %-100 %] 100 % (09/16 1040) FiO2 (%):  [28 %] 28  % (09/16 1040) Weight:  [89.4 kg] 89.4 kg (09/16 0500) Last BM Date : 10/26/21  Weight change: Filed Weights   10/24/21 0500 10/25/21 0500 10/26/21 0500  Weight: 94.3 kg 93.6 kg 89.4 kg    Intake/Output:   Intake/Output Summary (Last 24 hours) at 10/26/2021 1208 Last data filed at 10/26/2021 1200 Gross per 24 hour  Intake 3686.8 ml  Output 3934 ml  Net -247.2 ml   Exam: General:  Sitting up in bed On vent through trach HEENT: normal + cor-trak  Neck: supple. RIJ HD cath + trach   Cor: Sternal wound ok Regular Lungs: clear Abdomen: soft, nontender, nondistended. No hepatosplenomegaly. No bruits or masses. Good bowel sounds. Extremities: no cyanosis, clubbing, rash, edema Neuro: awake alert. Following commands  weak   Telemetry: NSR 60-70s, personally reviewed    Labs: Basic Metabolic Panel: Recent Labs  Lab 10/22/21 0406 10/22/21 1529 10/23/21 0450 10/23/21 1543 10/24/21 0332 10/24/21 1600 10/25/21 0339 10/25/21 1450 10/26/21 0429  NA 136   < > 136   < > 138 138 139 138 136  K 3.8   < > 4.9   < > 4.0 4.3 4.1 4.2 3.9  CL 104   < > 105   < > 105 105 105 108 102  CO2 24   < > 20*   < > _0 GLUCOSE 205*   < > 285*   < >  266* 157* 165* 122* 250*  BUN 21   < > 22   < > _0 CREATININE 1.33*   < > 1.38*   < > 1.20 0.99 0.98 1.12 1.09  CALCIUM 8.2*   < > 7.9*   < > 7.9* 8.0* 8.1* 7.7* 8.2*  MG 2.4  --  2.4  --  2.2  --  2.3  --  2.5*  PHOS 1.5*   < > 2.3*   < > 3.0 1.7* 4.2 1.8* 1.7*   < > = values in this interval not displayed.     Liver Function Tests: Recent Labs  Lab 10/22/21 0406 10/22/21 1529 10/24/21 0332 10/24/21 1600 10/25/21 0339 10/25/21 1450 10/26/21 0429  AST 38  --   --   --   --   --   --   ALT 22  --   --   --   --   --   --   ALKPHOS 137*  --   --   --   --   --   --   BILITOT 1.0  --   --   --   --   --   --   PROT 6.2*  --   --   --   --   --   --   ALBUMIN 2.4*  2.4*   < > 2.4* 2.4* 2.2* 2.2* 2.2*   < > =  values in this interval not displayed.    No results for input(s): "LIPASE", "AMYLASE" in the last 168 hours. No results for input(s): "AMMONIA" in the last 168 hours.  CBC: Recent Labs  Lab 10/22/21 0406 10/22/21 2354 10/23/21 0450 10/24/21 0332 10/25/21 0339 10/25/21 1500 10/26/21 0429  WBC 18.6*  --  23.2* 21.8* 19.1*  --  18.7*  HGB 8.5*   < > 8.5* 8.2* 7.6* 7.7* 7.4*  HCT 28.0*   < > 27.2* 26.7* 24.9* 24.4* 25.1*  MCV 94.6  --  95.1 95.0 94.7  --  98.0  PLT 497*  --  488* 429* 392  --  387   < > = values in this interval not displayed.     Cardiac Enzymes: Recent Labs  Lab 10/23/21 1432  CKTOTAL 407*     BNP: BNP (last 3 results) Recent Labs    12/24/20 0651 09/28/21 0405  BNP 412.0* 826.6*     ProBNP (last 3 results) No results for input(s): "PROBNP" in the last 8760 hours.    Other results:  Imaging: DG Abd Portable 1V  Result Date: 10/24/2021 CLINICAL DATA:  Feeding tube placement EXAM: PORTABLE ABDOMEN - 1 VIEW COMPARISON:  10/21/2021 FINDINGS: Nasogastric tube with the tip projecting over the stomach. Feeding tube with the tip projecting over the duodenal jejunal flexure. No bowel dilatation to suggest obstruction. No evidence of pneumoperitoneum, portal venous gas or pneumatosis. No pathologic calcifications along the expected course of the ureters. No acute osseous abnormality. IMPRESSION: 1. Nasogastric tube with the tip projecting over the stomach. Feeding tube with the tip projecting over the duodenal jejunal flexure. Electronically Signed   By: Kathreen Devoid M.D.   On: 10/24/2021 12:45     Medications:     Scheduled Medications:  sodium chloride   Intravenous Once   acetaminophen (TYLENOL) oral liquid 160 mg/5 mL  650 mg Per Tube Q6H   ascorbic acid  250 mg Per Tube BID   aspirin  81 mg Per Tube Daily  Chlorhexidine Gluconate Cloth  6 each Topical Daily   clonazePAM  0.5 mg Per Tube Q8H   collagenase   Topical Daily   [START ON  10/28/2021] darbepoetin (ARANESP) injection - NON-DIALYSIS  100 mcg Subcutaneous Q Mon-1800   docusate  100 mg Per Tube BID   feeding supplement (PROSource TF20)  60 mL Per Tube TID   folic acid  1 mg Per Tube Daily   heparin injection (subcutaneous)  5,000 Units Subcutaneous Q8H   insulin aspart  0-20 Units Subcutaneous Q4H   insulin aspart  2 Units Subcutaneous Q4H   insulin glargine-yfgn  10 Units Subcutaneous QHS   midodrine  15 mg Per Tube TID WC   multivitamin  1 tablet Per Tube QHS   mouth rinse  15 mL Mouth Rinse 4 times per day   oxyCODONE  20 mg Per Tube Q6H   pantoprazole (PROTONIX) IV  40 mg Intravenous Q12H   polyethylene glycol  17 g Per Tube Daily   QUEtiapine  50 mg Per Tube BID   sennosides  5 mL Per Tube QHS   sodium chloride flush  10-40 mL Intracatheter Q12H   sodium chloride flush  3 mL Intravenous Q12H   thiamine  100 mg Per Tube Daily   zinc sulfate  220 mg Per Tube Daily    Infusions:   prismasol BGK 4/2.5 500 mL/hr at 10/26/21 0411    prismasol BGK 4/2.5 600 mL/hr at 10/26/21 1030   sodium chloride Stopped (10/23/21 1841)   sodium chloride     albumin human Stopped (10/15/21 1901)   amiodarone Stopped (10/22/21 2247)   DAPTOmycin (CUBICIN) 900 mg in sodium chloride 0.9 % IVPB Stopped (10/25/21 2024)   epinephrine 1 mcg/min (10/26/21 1200)   feeding supplement (VITAL 1.5 CAL) 60 mL/hr at 10/26/21 1200   meropenem (MERREM) IV Stopped (10/26/21 0615)   micafungin (MYCAMINE) 200 mg in sodium chloride 0.9 % 100 mL IVPB Stopped (10/25/21 1234)   norepinephrine (LEVOPHED) Adult infusion 9 mcg/min (10/26/21 1200)   prismasol BGK 4/2.5 1,800 mL/hr at 10/26/21 0930   sodium phosphate 30 mmol in dextrose 5 % 250 mL infusion 43 mL/hr at 10/26/21 1200   vasopressin 0.04 Units/min (10/26/21 1200)    PRN Medications: sodium chloride, albumin human, camphor-menthol, dextrose, diphenhydrAMINE, heparin, hydrALAZINE, HYDROmorphone (DILAUDID) injection,  ipratropium-albuterol, midazolam, ondansetron (ZOFRAN) IV, mouth rinse, polyvinyl alcohol, sodium chloride flush, sodium chloride flush   Assessment/Plan:    1. Shock - mixed cardiogenic/hemorrhagic -> VA ECMO -> decannulated on 8/28 to Impella 5.5 - Echo 08/28: Underfilled LV, EF 55-60% with severe LVH and near normal RV systsystolic function.  - Impella extracted 9/7 - Concern for worsening sepsis with leukocytosis and increasing pressor requirements on 09/13. Purulent drainage from Aline, replaced. - BC X 2 NGTD, tracheal aspirate pending. Procalcitonin 1.63.  - Given prolonged abx course earlier in stay, addition of Daptomycin, Micafungin and meropenem recommended per ID.  - On Epi 1, NE 9 + VP 0.04. Co-ox improved.  - CVP 12. Volume managed with CRRT. Keep even today Aim to keep CVP 10-12 in setting of sepsis and RV failure - Bedside echo 09/13 - LV function preserved, RV mildly reduced. Small clot in posterior pericardium but no hemodynamic effect  2. Cardiac arrest (PEA/bradycardic) - 8/19 in setting of tamponade - PEA arrest again on 08/28  3. Cardiac tamponade with emergent bedside sternotomy  - Diffuse epicardial bleeding with post-op Dresslers - return to OR 8/21 and 8/24 for washouts.  -  Washout 8/28 in OR. Multiple units of blood products in OR.   - Chest closed on 9/2 - Hgb 7.4 today. If continues to drop will give 1uRBCs  4. Acute hypoxemic respiratory failure - Off ECMO.  - Vent support per CCM - Perc Trach placed 9/7  - He has completed course of Vanc + meropenem.  - s/p bronchoscopy w/ BAL 9/4.  - tracheal aspirate 09/13 growing few GPC and rare yeast  - Back on IV abx as above d/t concern for sepsis  5. AKI due to ATN - CRRT started 08/28.  - Remains on anurics and on CRRT, nephrology following  - Keep even today let CVP run a bit high  6. 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 Now off colchicine with CRRT.  7. CAD s/p  CABG x 5  09/12/21 - Statin. Continue aspirin 81 mg daily  8. DM2 - continue SSI   9. PAF/AFL - Tolerates poorly.  - Recurrent AFL. S/p DCCV  to SR 08/29.  - Off amio d/t bradycardia - Maintaining NSR - Not on AC for now d/t bleeding, DVT dose heparin.   10. ID - Completed vancomycin/meropenem with open chest.  - Abx restarted as above  11. Neuro -Follows commands off sedation  12. FEN - TFs ongoing. Now tolerating  13. Hyperkalemia - resolved with RRT.   CRITICAL CARE Performed by: Glori Bickers  Total critical care time: 35 minutes  Critical care time was exclusive of separately billable procedures and treating other patients.  Critical care was necessary to treat or prevent imminent or life-threatening deterioration.  Critical care was time spent personally by me (independent of midlevel providers or residents) on the following activities: development of treatment plan with patient and/or surrogate as well as nursing, discussions with consultants, evaluation of patient's response to treatment, examination of patient, obtaining history from patient or surrogate, ordering and performing treatments and interventions, ordering and review of laboratory studies, ordering and review of radiographic studies, pulse oximetry and re-evaluation of patient's condition.   Glori Bickers, MD  12:08 PM

## 2021-10-26 NOTE — Progress Notes (Signed)
     [id number]       [place] [id number]             [phone]       EVENING ROUNDS   Was unable to come off Epi earlier secondary to lower bp but with bp improved currently will retry No other new issues Continue to support

## 2021-10-26 NOTE — Progress Notes (Signed)
NAME:  [name], MRN:  [id number], DOB:  1960/08/04, LOS: 60 ADMISSION DATE:  09/28/2021, CONSULTATION DATE:  09/29/2021 REFERRING MD:  Kipp Brood - TCTS CHIEF COMPLAINT: Dyspnea   History of Present Illness:  61 year old man who underwent CABG on 8/3, readmitted on 8/19 in setting of dyspnea with a pericardial effusion requiring pericardial window via bedside sternotomy. Also with bilateral pleural effusions. Sustained PEA arrest on 8/20, briefly required VA ECMO which was discontinued on 8/28, Impella placed 8/28, started on CRRT.  Hypoxemia, repeat cardiac arrest on 8/30. Underwent mediastinal washout/sternotomy closure 9/2. Impella removed and tracheostomy completed 9/7.  Pertinent Medical History:  CAD s/p CABG 8/3 DM2 Diverticulosis GERD Hyperlipidemia Hypertension History of stroke Tobacco Abuse  Significant Hospital Events: Including procedures, antibiotic start and stop dates in addition to other pertinent events   8/19 Admitted, s/p pericardial window 8/20 PEA cardiac arrest, left pigtail chest tube placement, PEA cardiac arrest due to tamponade, bedside sternotomy with pericardial drains placed. 8/21 Return to OR for overnight bleeding.  8/22 Minimal chest tube output.  Atrial fibrillation, controled with amiodarone.  8/22 TEE showed EF 35% at baseline, which improved significantly with decreasing ECMO flow.  8/23 Tolerated diuresis  8/24 Mediastinal washout, removal of hematoma 8/25 Ongoing bleeding issues from posterior mediastinum 8/28 Decannulated and off ECMO, 5.5 Impella placed, L femoral HD cath placed and started on CRRT 8/30 Hypoxia and hypotension followed by brief arrest, gentle L lateral chest compressions performed and ROSC in roughly 1 minute. 8/31 Remains deeply sedated on ventilator with open chest.  No acute events overnight. 9/1 No acute events overnight, remains on ventilator deep sedation.  Tolerating aggressive volume removal per CRRT 9/2 Sternotomy  closure 9/3 Wean versed 9/4 FOB, low grade temps, notable thick mucus/frequent suctioning 9/5 Vomiting, ?mucus plugging. PSV 5-6 hours on 10/5 9/6 Impella down to p3 9/7 Impella removal and trach. PRBC and DDAVP in OR  9/8 Low dose epi, weaned on 8/8 9/9 RIJ HD catheter placed 9/11 Started on ATC at 28% and tolerating well 9/12 Remains on 28% ATC 9/13 SB/Junction rhythm with associated hypotension overnight requiring transient pressor escalation. Pressor requirement down in AM. WBC count 23 (18), Co-ox 36%. Resp/Blood Cx. 9/14 Mild bradycardia/relative hypotension with Precedex. Remains on Epi 19mcg, Vaso 0.03. Resp Cx with few gram+ cocci (clusters), yeast. BCx NGTD. 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 Co-ox 48%. CRRT stopped. 9/15 WBC continues to downtrend. Hgb 7.6. Net +1.49L/24H. CRRT resumed. Co-ox 51%. NE started to help with Epi wean.  Interim History / Subjective:  No significant events overnight CRRT continues On 3 pressors, stable Ongoing Dapto/Meropenem/Mica WBC downtrending Co-ox 66%, improved  Objective:  Blood pressure (!) 114/52, pulse 68, temperature (!) 96.5 F (35.8 C), temperature source Axillary, resp. rate (!) 24, height 5\' 7"  (1.702 m), weight 89.4 kg, SpO2 100 %. CVP:  [11 mmHg-16 mmHg] 14 mmHg  FiO2 (%):  [28 %] 28 %   Intake/Output Summary (Last 24 hours) at 10/26/2021 1059 Last data filed at 10/26/2021 1012 Gross per 24 hour  Intake 3724.99 ml  Output 3830 ml  Net -105.01 ml    Filed Weights   10/24/21 0500 10/25/21 0500 10/26/21 0500  Weight: 94.3 kg 93.6 kg 89.4 kg   Physical Examination: General: Acutely ill-appearing middle-aged man in NAD. Appears uncomfortable. HEENT: [place]/AT, anicteric sclera, PERRL, moist mucous membranes. Trach in place. Neuro:  Awake, unable to assess orientation but nodding appropriately/mouthing words to questions.  Responds to  verbal stimuli. Following commands intermittently.  Moves all 4 extremities spontaneously. CV: RRR, no m/g/r. PULM: Mildly tachypneic, breathing unlabored on ATC (5L, FiO2 28%). Lung fields with bilateral coarse rhonchi, diminished at bilateral bases. GI: Soft, nontender, nondistended. Hypoactive bowel sounds. Extremities: Bilateral symmetric 1+ LE edema noted. Skin: Warm/dry, no rashes.  Resolved Hospital Problem List:   Acute RV failure > resolved  Assessment & Plan:   Acute metabolic encephalopathy Required Dilaudid gtt discontinuation 9/13 early AM for hypotension/bradycardia. - Continue Precedex gtt as HR tolerates - Dilaudid as needed for dressing changes - Continue oxycodone  - Continue scheduled Klonopin, Seroquel - Goal RASS 0 to -1 - Delirium precautions - PT/OT/SLP as clinically appropriate  Hemorrhagic pericarditis with tamponade PEA arrest due to tamponade, s/p emergent sternotomy Cardiogenic & hemorrhagic shock, s/p VA ECMO (decannulated 8/29), s/p Impella (removed 9/7) CAD s/p CABG x3 Afib  Bedside Echo completed 9/12PM with preserved BiV function, trace pericardial effusion. Overnight 9/12-9/13 entered into SB then junctional rhythm with associated hypotension. Amio gtt stopped. - Continue Epi gtt titrated to goal MAP (>65), try to wean off - Continue vasopressin - Continue midodrine - Amiodarone discontinued in the setting of bradycardia/transient junctional rhythm - ASA/statin - Trend Co-ox, improved 9/16 AM - Remainder per HF/TCTS  Leukocytosis with severe sepsis WBC increase from 18 to 23 9/12 - 9/13. CXR with worsened aeration/developing airspace process, ?L pleural effusion reaccumulation. Resp Cx 9/13 with few gram positive cocci in clusters/yeast. WBC trending down. - vaso and NE, wean epi as able with MAP goal > 65 - Follow-up finalized Resp Cx - Blood Cx NGTD - Vanc transition to Dapto c/f VRE (9/13 > ) - Meropenem started (9/14 > ) - Micafungin started (9/14 > ) - GPC in clusters on lower resp  culture- not covering MRSA in lungs on dapto but with clinical improvement no changes today planned, will need to de-escalate based on final lower resp culture results  Acute respiratory failure with hypoxemia and hypercarbia S/p Tracheostomy  - Tolerating ATC since 9/11 - Continue supplemental O2 support - Wean FiO2 for O2 sat > 90%, current ATC 28% FiO2 - VAP bundle - Routine trach care - Pulmonary hygiene (CPT/nebs, ongoing intermittent mucus plugging) - PAD protocol for sedation: Precedex and Dilaudid for goal RASS 0 to -1, wean precedex off   AKI with anuria  Hypophosphatemia - Nephro following, appreciate assistance - CRRT per nephrology, goal net even to slightly net positive - Trend BMP - Replete electrolytes as indicated - Monitor I&Os - Avoid nephrotoxic agents as able  ABLA, previous hemorrhagic shock Thrombocytosis  - Trend H&H - Monitor for signs of active bleeding - Transfuse for Hgb < 7.0 or hemodynamically significant bleeding - Consider threshold of Hgb 8.0 given patient's history of CAD, defer to heart failure/TCTS for transfusion decision  Maculopapular rash- only new thing is banatrol. No history of food allergies. Banatrol stopped 9/10. - Continue to monitor - Benadryl as needed  Vomiting Diarrhea, improving - TF via Cotrak - Hold bowel regimen as clinically appropriate  DM II with hyperglycemia - Basal Semglee 10U daily, consider increase if glucoses remain uncontrolled - SSI, resistant scale - CBGs Q4H - Goal CBG 140-180  At Risk Malnutrition  - TF via Cortrak  Sacral pressure wound - WOC following  Lines/Tubes/Drains - FMS (8/25)  - Trach (9/7) - L Fem HD cath (8/28 > 9/8) - LIJ CVC (8/20 > 9/10)  - L rad art line (8/19 > 9/13) - R  rad art line (9/13 > ) - RIJ HD catheter (9/9 > )  Best Practice (right click and "Reselect all SmartList Selections" daily)  Diet/type: tubefeeds  DVT prophylaxis: prophylactic heparin  GI prophylaxis:  PPI Lines: Central line, Dialysis Catheter, and Arterial Line Foley:  N/A Code Status:  full code Last date of multidisciplinary goals of care discussion [Per Primary]  Critical care time:   CRITICAL CARE Performed by: Lanier Clam   Total critical care time: 35 minutes  Critical care time was exclusive of separately billable procedures and treating other patients.  Critical care was necessary to treat or prevent imminent or life-threatening deterioration.  Critical care was time spent personally by me on the following activities: development of treatment plan with patient and/or surrogate as well as nursing, discussions with consultants, evaluation of patient's response to treatment, examination of patient, obtaining history from patient or surrogate, ordering and performing treatments and interventions, ordering and review of laboratory studies, ordering and review of radiographic studies, pulse oximetry and re-evaluation of patient's condition.   Lanier Clam, MD [facility] 10/26/21 10:59 AM  Please see [email or web address] for contact info details.  From 7A-7P if no response, please call [phone] After hours, please call ELink [phone]

## 2021-10-26 NOTE — Progress Notes (Signed)
[facility] Progress Note  Name: [name] MRN: [id number] DOB: October 25, 1960   Subjective:  Stable on CRRT. No complaints this morning. Norepinephrine added to pressors    Intake/Output Summary (Last 24 hours) at 10/26/2021 0925 Last data filed at 10/26/2021 0900 Gross per 24 hour  Intake 3499.75 ml  Output 3695 ml  Net -195.25 ml    Vitals:  Vitals:   10/26/21 0800 10/26/21 0803 10/26/21 0816 10/26/21 0900  BP:      Pulse: 63  69 70  Resp: (!) 22  (!) 23 20  Temp:  (!) 96.5 F (35.8 C)    TempSrc:  Axillary    SpO2: 100%  100% 96%  Weight:      Height:         Physical Exam:          General lying in bed, ill appearing HEENT normocephalic atraumatic   Neck trach in place on TC Lungs coarse mechanical breath sounds, no rales, bilateral chest rise Heart no rub, normal rate Abdomen soft nontender nondistended Extremities trace edema in hips  Neuro - nods to questioning Access: RIJ nontunneled catheter    Medications reviewed   Labs:     Latest Ref Rng & Units 10/26/2021    4:29 AM 10/25/2021    2:50 PM 10/25/2021    3:39 AM  BMP  Glucose 70 - 99 mg/dL 250  122  165   BUN 8 - 23 mg/dL 15  13  15    Creatinine 0.61 - 1.24 mg/dL 1.09  1.12  0.98   Sodium 135 - 145 mmol/L 136  138  139   Potassium 3.5 - 5.1 mmol/L 3.9  4.2  4.1   Chloride 98 - 111 mmol/L 102  108  105   CO2 22 - 32 mmol/L 25  24  23    Calcium 8.9 - 10.3 mg/dL 8.2  7.7  8.1      Assessment/Plan:   Pt is a 61 y.o. yo male  with history of HTN, HLD, DM, A-fib, stroke, CKD, CAD multivessel dz status post CABG on 8/3 presented with shortness of breath, found to have cardiac tamponade, course complicated by cardiac arrest, use of ECMO, seen as a consultation for the evaluation of AKI and fluid volume management.   #Acute kidney injury on CKD IIIa, nonoliguric: Ischemic ATN due to cardiogenic shock/cardiac arrest and multiple hemodynamic changes.  Started CRRT on 8/28 for decreasing  urine output and fluid/volume management.  Brief (less than 24 hours) line free interval after clotting and CRRT resumed  on 9/9 after nontunneled line with critical care - Continue CRRT  - on 4K Fluids  - team would prefer citrate if agent needed for clotting -  No anticoagulation currently but did clot today.  If the patient starts to clot more frequently will consider starting citrate - transition to iHD as able. Vasopressor support persistent   #Cardiac tamponade/hemorrhagic pericarditis status post pericardial drain placement with subsequent ECMO placement.  decannulated  with placement of Impella.  S/p closure of the sternum and mediastinal washout on 9/2.   #Cardiogenic shock/cardiac arrest: Was on ECMO before. Continue pressors per primary team.  Impella out on 9/7    #Acute hypoxic respiratory failure:  improving - now on TC.  optimizing volume with CRRT    #Acute blood loss anemia: Monitor hemoglobin and transfuse as needed by ICU team.  Aranesp 40 mcg weekly on Mondays started on 9/4.  Increased dose to 100  mcg weekly      #Hypophosphatemia due to CRRT, replete sodium phosphate as needed  Disposition - continue ICU monitoring  Reesa Chew, MD 10/26/2021 9:25 AM

## 2021-10-26 NOTE — Progress Notes (Signed)
[address]       [place] [address]             [phone]      9 Days Post-Op  Procedure(s) (LRB): REMOVAL OF IMPELLA LEFT VENTRICULAR ASSIST DEVICE (Right) TRANSESOPHAGEAL ECHOCARDIOGRAM (TEE) (N/A) PERCUTANEOUS TRACHEOSTOMY USING SHILEY FLEXIBLE 8 mm CUFFED TRACH. (N/A)   Total Length of Stay:  LOS: 28 days    SUBJECTIVE: Awake and conversant Signals no complaints Tolerating tube feeds Have been able to wean Epi some  Vitals:   10/26/21 0816 10/26/21 0900  BP:    Pulse: 69 70  Resp: (!) 23 20  Temp:    SpO2: 100% 96%    Intake/Output      09/15 0701 09/16 0700 09/16 0701 09/17 0700   I.V. (mL/kg) 869.2 (9.7) 61.9 (0.7)   Other     NG/GT 1880 220   IV Piggyback 739.5 22.1   Total Intake(mL/kg) 3488.7 (39) 304 (3.4)   Emesis/NG output 750 50   Stool 700    CRRT 2604 332   Total Output 4054 382   Net -565.3 -78             prismasol BGK 4/2.5 500 mL/hr at 10/26/21 0411    prismasol BGK 4/2.5 600 mL/hr at 10/26/21 0204   sodium chloride Stopped (10/23/21 1841)   sodium chloride     albumin human Stopped (10/15/21 1901)   amiodarone Stopped (10/22/21 2247)   DAPTOmycin (CUBICIN) 900 mg in sodium chloride 0.9 % IVPB Stopped (10/25/21 2024)   dexmedetomidine (PRECEDEX) IV infusion 0.2 mcg/kg/hr (10/26/21 0900)   epinephrine 2 mcg/min (10/26/21 0900)   feeding supplement (VITAL 1.5 CAL) 60 mL/hr at 10/26/21 0900   meropenem (MERREM) IV Stopped (10/26/21 0615)   micafungin (MYCAMINE) 200 mg in sodium chloride 0.9 % 100 mL IVPB Stopped (10/25/21 1234)   norepinephrine (LEVOPHED) Adult infusion 9 mcg/min (10/26/21 0900)   prismasol BGK 4/2.5 1,800 mL/hr at 10/26/21 0638   sodium phosphate 30 mmol in dextrose 5 % 250 mL infusion 43 mL/hr at 10/26/21 0900   vasopressin 0.04 Units/min (10/26/21 0900)    CBC    Component Value Date/Time   WBC 18.7 (H) 10/26/2021 0429   RBC 2.56 (L) 10/26/2021 0429   HGB 7.4 (L) 10/26/2021 0429    HGB 15.4 02/13/2021 0855   HCT 25.1 (L) 10/26/2021 0429   HCT 45.7 02/13/2021 0855   PLT 387 10/26/2021 0429   PLT 239 02/13/2021 0855   MCV 98.0 10/26/2021 0429   MCV 88 02/13/2021 0855   MCH 28.9 10/26/2021 0429   MCHC 29.5 (L) 10/26/2021 0429   RDW 19.2 (H) 10/26/2021 0429   RDW 11.3 (L) 02/13/2021 0855   LYMPHSABS 2.2 09/29/2021 1956   MONOABS 2.1 (H) 09/29/2021 1956   EOSABS 0.0 09/29/2021 1956   BASOSABS 0.0 09/29/2021 1956   CMP     Component Value Date/Time   NA 136 10/26/2021 0429   NA 135 02/13/2021 0855   K 3.9 10/26/2021 0429   CL 102 10/26/2021 0429   CO2 25 10/26/2021 0429   GLUCOSE 250 (H) 10/26/2021 0429   BUN 15 10/26/2021 0429   BUN 9 02/13/2021 0855   CREATININE 1.09 10/26/2021 0429   CALCIUM 8.2 (L) 10/26/2021 0429   PROT 6.2 (L) 10/22/2021 0406   ALBUMIN 2.2 (L) 10/26/2021 0429   AST 38 10/22/2021 0406   ALT 22 10/22/2021 0406   ALKPHOS 137 (H) 10/22/2021  0406   BILITOT 1.0 10/22/2021 0406   GFRNONAA >60 10/26/2021 0429   GFRAA >60 08/25/2019 2110   ABG    Component Value Date/Time   PHART 7.301 (L) 10/22/2021 2354   PCO2ART 38.5 10/22/2021 2354   PO2ART 73 (L) 10/22/2021 2354   HCO3 19.3 (L) 10/22/2021 2354   TCO2 20 (L) 10/22/2021 2354   ACIDBASEDEF 7.0 (H) 10/22/2021 2354   O2SAT 66.5 10/26/2021 0429   CBG (last 3)  Recent Labs    10/26/21 0026 10/26/21 0426 10/26/21 0756  GLUCAP 178* 238* 117*     ASSESSMENT: Shock following pericardial drainage  Slow ongoing wean of inotropes with Levo, epi vaso still infusing   Renal failure with ongoing need for CRRT. As per Renal   WBC stable on multiple broad spectrum antibiotics, continue    Monitor chest wound closely    Much less Ngt drainage. Consider removal if remains low next 24-48hrs     Sacral wound as per wound team   Coralie Common, MD @DATE @

## 2021-10-27 ENCOUNTER — Inpatient Hospital Stay: Payer: Self-pay

## 2021-10-27 LAB — CBC
HCT: 26.5 % — ABNORMAL LOW (ref 39.0–52.0)
Hemoglobin: 8.1 g/dL — ABNORMAL LOW (ref 13.0–17.0)
MCH: 29.3 pg (ref 26.0–34.0)
MCHC: 30.6 g/dL (ref 30.0–36.0)
MCV: 96 fL (ref 80.0–100.0)
Platelets: 451 10*3/uL — ABNORMAL HIGH (ref 150–400)
RBC: 2.76 MIL/uL — ABNORMAL LOW (ref 4.22–5.81)
RDW: 19.6 % — ABNORMAL HIGH (ref 11.5–15.5)
WBC: 22.9 10*3/uL — ABNORMAL HIGH (ref 4.0–10.5)
nRBC: 3.3 % — ABNORMAL HIGH (ref 0.0–0.2)

## 2021-10-27 LAB — RENAL FUNCTION PANEL
Albumin: 2.4 g/dL — ABNORMAL LOW (ref 3.5–5.0)
Albumin: 2.3 g/dL — ABNORMAL LOW (ref 3.5–5.0)
Anion gap: 11 (ref 5–15)
Anion gap: 11 (ref 5–15)
BUN: 15 mg/dL (ref 8–23)
BUN: 16 mg/dL (ref 8–23)
CO2: 24 mmol/L (ref 22–32)
CO2: 24 mmol/L (ref 22–32)
Calcium: 8.6 mg/dL — ABNORMAL LOW (ref 8.9–10.3)
Calcium: 8.7 mg/dL — ABNORMAL LOW (ref 8.9–10.3)
Chloride: 103 mmol/L (ref 98–111)
Chloride: 104 mmol/L (ref 98–111)
Creatinine, Ser: 1.2 mg/dL (ref 0.61–1.24)
Creatinine, Ser: 1.49 mg/dL — ABNORMAL HIGH (ref 0.61–1.24)
GFR, Estimated: >60 mL/min (ref >60)
GFR, Estimated: 53 mL/min — ABNORMAL LOW (ref >60)
Glucose, Bld: 144 mg/dL — ABNORMAL HIGH (ref 70–99)
Glucose, Bld: 139 mg/dL — ABNORMAL HIGH (ref 70–99)
Phosphorus: 2 mg/dL — ABNORMAL LOW (ref 2.5–4.6)
Phosphorus: 3.9 mg/dL (ref 2.5–4.6)
Potassium: 4.3 mmol/L (ref 3.5–5.1)
Potassium: 4.1 mmol/L (ref 3.5–5.1)
Sodium: 138 mmol/L (ref 135–145)
Sodium: 139 mmol/L (ref 135–145)

## 2021-10-27 LAB — GLUCOSE, CAPILLARY
Glucose-Capillary: 143 mg/dL — ABNORMAL HIGH (ref 70–99)
Glucose-Capillary: 161 mg/dL — ABNORMAL HIGH (ref 70–99)
Glucose-Capillary: 175 mg/dL — ABNORMAL HIGH (ref 70–99)
Glucose-Capillary: 127 mg/dL — ABNORMAL HIGH (ref 70–99)
Glucose-Capillary: 167 mg/dL — ABNORMAL HIGH (ref 70–99)

## 2021-10-27 LAB — COOXEMETRY PANEL
Carboxyhemoglobin: 2 % — ABNORMAL HIGH (ref 0.5–1.5)
Methemoglobin: <0.7 % (ref 0.0–1.5)
O2 Saturation: 63.8 %
Total hemoglobin: 7.9 g/dL — ABNORMAL LOW (ref 12.0–16.0)

## 2021-10-27 LAB — MAGNESIUM: Magnesium: 2.7 mg/dL — ABNORMAL HIGH (ref 1.7–2.4)

## 2021-10-27 MED ORDER — SODIUM CHLORIDE 0.9% FLUSH
10.0000 mL | Freq: Two times a day (BID) | INTRAVENOUS | Status: DC
  Administered 2021-10-28 – 2021-11-01 (×8): 10 mL

## 2021-10-27 MED ORDER — SODIUM CHLORIDE 0.9% FLUSH: 10.0000 mL | INTRAVENOUS | Status: DC | PRN

## 2021-10-27 MED ORDER — SODIUM PHOSPHATES 45 MMOLE/15ML IV SOLN
30.0000 mmol | Freq: Once | INTRAVENOUS | Status: AC
  Administered 2021-10-27: 30 mmol via INTRAVENOUS
  Filled 2021-10-27: qty 10

## 2021-10-27 MED ORDER — PANTOPRAZOLE 2 MG/ML SUSPENSION
40.0000 mg | Freq: Two times a day (BID) | ORAL | Status: DC
  Administered 2021-10-27 – 2021-11-24 (×48): 40 mg
  Filled 2021-10-27 (×58): qty 20

## 2021-10-27 NOTE — Progress Notes (Signed)
     [address]       [place] [address]             [phone]       EVENING ROUNDS PICC line in Switching over to this line but has been able to come down on Levo Will continue to try overnight Otherwise stable

## 2021-10-27 NOTE — Progress Notes (Signed)
NAME:  [name], MRN:  [id number], DOB:  [birth year], LOS: 20 ADMISSION DATE:  09/28/2021, CONSULTATION DATE:  09/29/2021 REFERRING MD:  Kipp Brood - TCTS CHIEF COMPLAINT: Dyspnea   History of Present Illness:  61 year old man who underwent CABG on 8/3, readmitted on 8/19 in setting of dyspnea with a pericardial effusion requiring pericardial window via bedside sternotomy. Also with bilateral pleural effusions. Sustained PEA arrest on 8/20, briefly required [facility] ECMO which was discontinued on 8/28, Impella placed 8/28, started on CRRT.  Hypoxemia, repeat cardiac arrest on 8/30. Underwent mediastinal washout/sternotomy closure 9/2. Impella removed and tracheostomy completed 9/7.  Pertinent Medical History:  CAD s/p CABG 8/3 DM2 Diverticulosis GERD Hyperlipidemia Hypertension History of stroke Tobacco Abuse  Significant Hospital Events: Including procedures, antibiotic start and stop dates in addition to other pertinent events   8/19 Admitted, s/p pericardial window 8/20 PEA cardiac arrest, left pigtail chest tube placement, PEA cardiac arrest due to tamponade, bedside sternotomy with pericardial drains placed. 8/21 Return to OR for overnight bleeding.  8/22 Minimal chest tube output.  Atrial fibrillation, controled with amiodarone.  8/22 TEE showed EF 35% at baseline, which improved significantly with decreasing ECMO flow.  8/23 Tolerated diuresis  8/24 Mediastinal washout, removal of hematoma 8/25 Ongoing bleeding issues from posterior mediastinum 8/28 Decannulated and off ECMO, 5.5 Impella placed, L femoral HD cath placed and started on CRRT 8/30 Hypoxia and hypotension followed by brief arrest, gentle L lateral chest compressions performed and ROSC in roughly 1 minute. 8/31 Remains deeply sedated on ventilator with open chest.  No acute events overnight. 9/1 No acute events overnight, remains on ventilator deep sedation.  Tolerating aggressive volume removal per CRRT 9/2 Sternotomy  closure 9/3 Wean versed 9/4 FOB, low grade temps, notable thick mucus/frequent suctioning 9/5 Vomiting, ?mucus plugging. PSV 5-6 hours on 10/5 9/6 Impella down to p3 9/7 Impella removal and trach. PRBC and DDAVP in OR  9/8 Low dose epi, weaned on 8/8 9/9 RIJ HD catheter placed 9/11 Started on ATC at 28% and tolerating well 9/12 Remains on 28% ATC 9/13 SB/Junction rhythm with associated hypotension overnight requiring transient pressor escalation. Pressor requirement down in AM. WBC count 23 (18), Co-ox 36%. Resp/Blood Cx. 9/14 Mild bradycardia/relative hypotension with Precedex. Remains on Epi 64mcg, Vaso 0.03. Resp Cx with few gram+ cocci (clusters), yeast. BCx NGTD. 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 Co-ox 48%. CRRT stopped. 9/15 WBC continues to downtrend. Hgb 7.6. Net +1.49L/24H. CRRT resumed. Co-ox 51%. NE started to help with Epi wean. 9/16, Co. oximetry improved, WBC trending down, weaning pressors  Interim History / Subjective:  No significant events overnight CRRT continues On 3 pressors, epi down to 1, weaning norepinephrine, on vasopressin Worsening secretions Lower history culture with OP flora, no MRSA or Pseudomonas, blood cultures remain  no growth to date  Objective:  Blood pressure (!) 114/52, pulse 80, temperature 98.8 F (37.1 C), temperature source Oral, resp. rate (!) 23, height 5\' 7"  (1.702 m), weight 90.3 kg, SpO2 97 %. CVP:  [12 mmHg-18 mmHg] 12 mmHg  FiO2 (%):  [28 %] 28 %   Intake/Output Summary (Last 24 hours) at 10/27/2021 1052 Last data filed at 10/27/2021 1000 Gross per 24 hour  Intake 3515.01 ml  Output 4569.6 ml  Net -1054.59 ml    Filed Weights   10/25/21 0500 10/26/21 0500 10/27/21 0500  Weight: 93.6 kg 89.4 kg 90.3 kg   Physical Examination: General: Acutely ill-appearing middle-aged man in NAD. Appears  uncomfortable. HEENT: [place]/AT, anicteric sclera, PERRL, moist mucous membranes. Trach in  place. Neuro:  Awake, unable to assess orientation but nodding appropriately/mouthing words to questions.  Responds to verbal stimuli. Following commands intermittently. Moves all 4 extremities spontaneously. CV: RRR, no m/g/r. PULM: Mildly tachypneic, breathing unlabored on ATC (5L, FiO2 28%). Lung fields with bilateral coarse rhonchi, diminished at bilateral bases. GI: Soft, nontender, nondistended. Hypoactive bowel sounds. Extremities: Bilateral symmetric 1+ LE edema noted. Skin: Warm/dry, no rashes.  Resolved Hospital Problem List:   Acute RV failure > resolved  Assessment & Plan:   Acute metabolic encephalopathy Required Dilaudid gtt discontinuation 9/13 early AM for hypotension/bradycardia. -Precedex weaned off 9/16 - Dilaudid as needed for dressing changes - Continue oxycodone  - Continue scheduled Klonopin, Seroquel - Goal RASS 0 to -1 - Delirium precautions - PT/OT/SLP as clinically appropriate  Hemorrhagic pericarditis with tamponade PEA arrest due to tamponade, s/p emergent sternotomy Cardiogenic & hemorrhagic shock, s/p VA ECMO (decannulated 8/29), s/p Impella (removed 9/7) CAD s/p CABG x3 Afib  Echo with RV dysfunction, LV okay, trace pericardial effusion. Overnight 9/12-9/13 entered into SB then junctional rhythm with associated hypotension. Amio gtt stopped. - Continue Epi gtt titrated to goal MAP (>65), holding that 1 now given RV dysfunction - Continue vasopressin and norepinephrine, wean as able - Continue midodrine - Amiodarone discontinued in the setting of bradycardia/transient junctional rhythm - ASA/statin - Trend Co-ox, improved and stable - Remainder per HF/TCTS  Leukocytosis with severe sepsis WBC has been trending down now back to despite daptomycin, meropenem, micafungin.  No clear source of infection.  Does have secretions and chest imaging findings, pneumonia most likely source with OP for almost respiratory culture. -Wean vaso and norepi, levo epi  at 1, MAP greater than 65 -MRSA culture finalized with OP flora - Blood Cx NGTD - Vanc transition to Dapto c/f VRE now discontinued (9/13 > 9/17) - Meropenem to continue with ongoing secretions, consider de-escalating given OP flora on culture (9/14 > ) - Micafungin now discontinued (9/14 > 9/17)  Acute respiratory failure with hypoxemia and hypercarbia S/p Tracheostomy  - Tolerating ATC since 9/11 - Continue supplemental O2 support - Wean FiO2 for O2 sat > 90%, current ATC 28% FiO2 - VAP bundle - Routine trach care - Pulmonary hygiene (CPT/nebs, ongoing intermittent mucus plugging) - PAD protocol for sedation: Precedex and Dilaudid for goal RASS 0 to -1, wean precedex off   AKI with anuria  Hypophosphatemia - Nephro following, appreciate assistance - CRRT per nephrology, goal net even to slightly net positive - Trend BMP - Replete electrolytes as indicated - Monitor I&Os - Avoid nephrotoxic agents as able -After discussion with TTTS nephrology, okay for PICC placement, ordered 10/27/2021  ABLA, previous hemorrhagic shock Thrombocytosis  - Trend H&H - Monitor for signs of active bleeding - Transfuse for Hgb < 7.0 or hemodynamically significant bleeding - Consider threshold of Hgb 8.0 given patient's history of CAD, defer to heart failure/TCTS for transfusion decision  Maculopapular rash- only new thing is banatrol. No history of food allergies. Banatrol stopped 9/10. - Continue to monitor - Benadryl as needed  Vomiting Diarrhea, improving - TF via Cotrak - Hold bowel regimen as clinically appropriate  DM II with hyperglycemia - Basal Semglee 10U daily, consider increase if glucoses remain uncontrolled - SSI, resistant scale - CBGs Q4H - Goal CBG 140-180  At Risk Malnutrition  - TF via Cortrak  Sacral pressure wound - WOC following  Lines/Tubes/Drains - FMS (8/25)  - Trach (  9/7) - L Fem HD cath (8/28 > 9/8) - LIJ CVC (8/20 > 9/10)  - L rad art line (8/19 >  9/13) - R rad art line (9/13 > ) - RIJ HD catheter (9/9 > ) -PICC ordered 10/27/2021  Best Practice (right click and "Reselect all SmartList Selections" daily)  Diet/type: tubefeeds  DVT prophylaxis: prophylactic heparin  GI prophylaxis: PPI Lines: Central line, Dialysis Catheter, and Arterial Line Foley:  N/A Code Status:  full code Last date of multidisciplinary goals of care discussion [Per Primary]  Critical care time:   CRITICAL CARE Performed by: Lanier Clam   Total critical care time: 38 minutes  Critical care time was exclusive of separately billable procedures and treating other patients.  Critical care was necessary to treat or prevent imminent or life-threatening deterioration.  Critical care was time spent personally by me on the following activities: development of treatment plan with patient and/or surrogate as well as nursing, discussions with consultants, evaluation of patient's response to treatment, examination of patient, obtaining history from patient or surrogate, ordering and performing treatments and interventions, ordering and review of laboratory studies, ordering and review of radiographic studies, pulse oximetry and re-evaluation of patient's condition.   Lanier Clam, MD [facility] 10/27/21 10:52 AM  Please see [email or web address] for contact info details.  From 7A-7P if no response, please call [phone] After hours, please call ELink [phone]

## 2021-10-27 NOTE — Progress Notes (Signed)
Follow up - Critical Care Medicine Note  Patient Details:    [name] is an 61 y.o. male.   Subjective:    Overnight Issues:  Unable to wean epi off through the day yesterday Still minimal out NGt Objective:  Vital signs for last 24 hours: Temp:  [97.2 F (36.2 C)-98.8 F (37.1 C)] 98.8 F (37.1 C) (09/17 0300) Pulse Rate:  [68-79] 78 (09/17 0732) Resp:  [18-35] 24 (09/17 0732) SpO2:  [92 %-100 %] 99 % (09/17 0732) FiO2 (%):  [28 %] 28 % (09/17 0732) Weight:  [90.3 kg] 90.3 kg (09/17 0500)  Hemodynamic parameters for last 24 hours: CVP:  [12 mmHg-18 mmHg] 12 mmHg  Intake/Output from previous day: 09/16 0701 - 09/17 0700 In: 3278 [I.V.:562.9; NG/GT:2020; IV Piggyback:665.1] Out: 4766.6 [Emesis/NG output:375; Stool:600]  Intake/Output this shift: Total I/O In: 469.7 [I.V.:352.1; NG/GT:60; IV Piggyback:57.6] Out: -   Vent settings for last 24 hours: FiO2 (%):  [28 %] 28 %  Physical Exam:  Ext warm and dy Awake and alert  difficult to understand his concerns. Nurse thinks its his lips and neck line Dressing intact  Assessment/Plan:    LOS: 29 days   Shock following pericardial drainage Overall stable but with inability to wean drips. Discussed with nurse to try alternating with levo to see what medication more able to remove. May need epi for RV support WBC elevated on multiple antibiotics Will remove NGT since tolerating [place]hoff feedings and able to give meds through that Should keep [place] up with tube out  Critical Care Total Time*: [place] 10/27/2021  *Care during the described time interval was provided by me and/or other providers on the critical care team.  I have reviewed this patient's available data, including medical history, events of note, physical examination and test results as part of my evaluation.

## 2021-10-27 NOTE — Progress Notes (Signed)
Patient ID: [name], male   DOB: 1961/01/07, 61 y.o.   MRN: [id number]   Advanced Heart Failure Rounding Note   Subjective:    - 8/19 Pericardial window - 8/20 Cardiac arrest with tamponade -> Emergent bedside washout - 09/29/21 VA Cannulation - 09/30/21 Return to OR for mediastinal hemorrhage - 10/01/21 Developed AF -> amio - 10/02/21 TEE EF 25-30%  - 10/04/21 OR for washout. C/b continued bleeding overnight - 10/07/21 Placement of Impella 5.5 with washout, VA ECMO decannulation. Hypotensive with development of severe RV dysfunction after ECMO off and pressors titrated up. Multiple units of blood products. - 10/08/21 Brief PEA arrest. AFL with RVR >> S/p DCCV to SR, back in AFL shortly after - 8/31 Give 1UPRBCs  - 9/2 OR for chest closure - 9/3 Hypotensive overnight w/ SBPs in 80s. Febrile, mTemp 100.8. CRRT paused. VP increased to 0.04.  - 9/4 s/p bronchoscopy w/ BAL by PCCM, Cx NGTD  - 9/5 vomiting w/ large volume NGT output + watery/foul diarrhea, Tube feeds held. No signs of ileus on KUB. C-diff negative   - 9/7 OR for Impella Extraction and percutaneous tracheostomy. 2 u RBCs. - 9/8 CVVH stopped and line removed for holiday - 9/9 CVVH restarted.  -9/13 Worsening leukocytosis and pressor requirements. Purulent drainage from arterial line. Started Daptomycin, Micafungin and Meropenem.  Remains on CVVHD. - 1.5L yesterday. Weight up 2 pounds.   Awake. Alert. Interactive. Pointing to letters on letter board. Epi down to 1. NE 9 -> 4. VP 0.04. Co-ox 64%  C/o pain at sacral wound site.   CVP:  [12 mmHg-18 mmHg] 12 mmHg   Echo 08/28: LV EF 55-60% with severe LVH, small ventricular cavity, RV appears to have near normal systolic function   Objective:     Vital Signs:   Temp:  [97.2 F (36.2 C)-98.8 F (37.1 C)] 98.8 F (37.1 C) (09/17 0300) Pulse Rate:  [68-80] 80 (09/17 0900) Resp:  [18-35] 23 (09/17 0900) SpO2:  [92 %-100 %] 97 % (09/17 0900) FiO2 (%):  [28 %] 28 % (09/17  0732) Weight:  [90.3 kg] 90.3 kg (09/17 0500) Last BM Date : 10/27/21  Weight change: Filed Weights   10/25/21 0500 10/26/21 0500 10/27/21 0500  Weight: 93.6 kg 89.4 kg 90.3 kg    Intake/Output:   Intake/Output Summary (Last 24 hours) at 10/27/2021 1029 Last data filed at 10/27/2021 1000 Gross per 24 hour  Intake 3515.01 ml  Output 4569.6 ml  Net -1054.59 ml   Exam: General:  On vent through trach No resp difficulty HEENT: normal + cor-trak and NG Neck: supple. + trach. + HD cath Cor: Sternal dressing ok RRR Lungs: clear Abdomen: soft, nontender, nondistended. No hepatosplenomegaly. No bruits or masses. Good bowel sounds. Extremities: no cyanosis, clubbing, rash, edema Neuro: alert following commands. Weak    Telemetry: NSR  70-80s personally reviewed    Labs: Basic Metabolic Panel: Recent Labs  Lab 10/23/21 0450 10/23/21 1543 10/24/21 0332 10/24/21 1600 10/25/21 0339 10/25/21 1450 10/26/21 0429 10/26/21 1550 10/27/21 0428  NA 136   < > 138   < > 139 138 136 138 138  K 4.9   < > 4.0   < > 4.1 4.2 3.9 4.2 4.3  CL 105   < > 105   < > 105 108 102 110 103  CO2 20*   < > 23   < > _0 GLUCOSE 285*   < > 266*   < >  165* 122* 250* 148* 144*  BUN 22   < > 19   < > _0 CREATININE 1.38*   < > 1.20   < > 0.98 1.12 1.09 1.16 1.20  CALCIUM 7.9*   < > 7.9*   < > 8.1* 7.7* 8.2* 7.6* 8.6*  MG 2.4  --  2.2  --  2.3  --  2.5*  --  2.7*  PHOS 2.3*   < > 3.0   < > 4.2 1.8* 1.7* 2.8 2.0*   < > = values in this interval not displayed.     Liver Function Tests: Recent Labs  Lab 10/22/21 0406 10/22/21 1529 10/25/21 0339 10/25/21 1450 10/26/21 0429 10/26/21 1550 10/27/21 0428  AST 38  --   --   --   --   --   --   ALT 22  --   --   --   --   --   --   ALKPHOS 137*  --   --   --   --   --   --   BILITOT 1.0  --   --   --   --   --   --   PROT 6.2*  --   --   --   --   --   --   ALBUMIN 2.4*  2.4*   < > 2.2* 2.2* 2.2* 2.3* 2.4*   < > = values in  this interval not displayed.    No results for input(s): "LIPASE", "AMYLASE" in the last 168 hours. No results for input(s): "AMMONIA" in the last 168 hours.  CBC: Recent Labs  Lab 10/23/21 0450 10/24/21 0332 10/25/21 0339 10/25/21 1500 10/26/21 0429 10/27/21 0428  WBC 23.2* 21.8* 19.1*  --  18.7* 22.9*  HGB 8.5* 8.2* 7.6* 7.7* 7.4* 8.1*  HCT 27.2* 26.7* 24.9* 24.4* 25.1* 26.5*  MCV 95.1 95.0 94.7  --  98.0 96.0  PLT 488* 429* 392  --  387 451*     Cardiac Enzymes: Recent Labs  Lab 10/23/21 1432  CKTOTAL 407*     BNP: BNP (last 3 results) Recent Labs    12/24/20 0651 09/28/21 0405  BNP 412.0* 826.6*     ProBNP (last 3 results) No results for input(s): "PROBNP" in the last 8760 hours.    Other results:  Imaging: [place] EKG SITE RITE  Result Date: 10/27/2021 If Site Rite image not attached, placement could not be confirmed due to current cardiac rhythm.    Medications:     Scheduled Medications:  sodium chloride   Intravenous Once   acetaminophen (TYLENOL) oral liquid 160 mg/5 mL  650 mg Per Tube Q6H   ascorbic acid  250 mg Per Tube BID   aspirin  81 mg Per Tube Daily   Chlorhexidine Gluconate Cloth  6 each Topical Daily   clonazePAM  0.5 mg Per Tube Q8H   collagenase   Topical Daily   [START ON 10/28/2021] darbepoetin (ARANESP) injection - NON-DIALYSIS  100 mcg Subcutaneous Q Mon-1800   docusate  100 mg Per Tube BID   feeding supplement (PROSource TF20)  60 mL Per Tube TID   folic acid  1 mg Per Tube Daily   heparin injection (subcutaneous)  5,000 Units Subcutaneous Q8H   insulin aspart  0-20 Units Subcutaneous Q4H   insulin aspart  2 Units Subcutaneous Q4H   insulin glargine-yfgn  10 Units Subcutaneous QHS   midodrine  15 mg  Per Tube TID WC   multivitamin  1 tablet Per Tube QHS   mouth rinse  15 mL Mouth Rinse 4 times per day   oxyCODONE  20 mg Per Tube Q6H   pantoprazole (PROTONIX) IV  40 mg Intravenous Q12H   polyethylene glycol  17 g Per  Tube Daily   QUEtiapine  50 mg Per Tube BID   sennosides  5 mL Per Tube QHS   sodium chloride flush  10-40 mL Intracatheter Q12H   sodium chloride flush  3 mL Intravenous Q12H   thiamine  100 mg Per Tube Daily   zinc sulfate  220 mg Per Tube Daily    Infusions:   prismasol BGK 4/2.5 500 mL/hr at 10/27/21 0420    prismasol BGK 4/2.5 300 mL/hr at 10/27/21 0643   sodium chloride 10 mL/hr at 10/27/21 0800   sodium chloride     albumin human Stopped (10/15/21 1901)   DAPTOmycin (CUBICIN) 900 mg in sodium chloride 0.9 % IVPB Stopped (10/27/21 0007)   epinephrine 1 mcg/min (10/27/21 1000)   feeding supplement (VITAL 1.5 CAL) 60 mL/hr at 10/27/21 1000   meropenem (MERREM) IV Stopped (10/27/21 0717)   micafungin (MYCAMINE) 200 mg in sodium chloride 0.9 % 100 mL IVPB Stopped (10/26/21 1318)   norepinephrine (LEVOPHED) Adult infusion 4 mcg/min (10/27/21 1000)   prismasol BGK 4/2.5 1,300 mL/hr at 10/27/21 0844   sodium phosphate 30 mmol in dextrose 5 % 250 mL infusion 43 mL/hr at 10/27/21 1000   vasopressin 0.04 Units/min (10/27/21 1000)    PRN Medications: sodium chloride, albumin human, camphor-menthol, dextrose, diphenhydrAMINE, heparin, hydrALAZINE, HYDROmorphone (DILAUDID) injection, ipratropium-albuterol, midazolam, ondansetron (ZOFRAN) IV, mouth rinse, polyvinyl alcohol, sodium chloride flush, sodium chloride flush   Assessment/Plan:    1. Shock - mixed cardiogenic/hemorrhagic -> VA ECMO -> decannulated on 8/28 to Impella 5.5 - Echo 08/28: Underfilled LV, EF 55-60% with severe LVH and near normal RV systsystolic function.  - Impella extracted 9/7 - Concern for worsening sepsis with leukocytosis and increasing pressor requirements on 09/13. Purulent drainage from Aline, replaced. - BC X 2 NGTD, tracheal aspirate pending. Procalcitonin 1.63.  - Given prolonged abx course earlier in stay, addition of Daptomycin, Micafungin and meropenem recommended per ID. CCM planning to narrow. Seems  reasonable.  - On Epi 1, NE 4 + VP 0.04. Co-ox improved. Wean as tolerated. On midodrine 15 tid to support.  - CVP 12 .Volume managed with CRRT. Keep even today Aim to keep CVP 10-12 in setting of sepsis and RV failure - Bedside echo 09/13 - LV function preserved, RV mildly reduced. Small clot in posterior pericardium but no hemodynamic effect  2. Cardiac arrest (PEA/bradycardic) - 8/19 in setting of tamponade - PEA arrest again on 08/28  3. Cardiac tamponade with emergent bedside sternotomy  - Diffuse epicardial bleeding with post-op Dresslers - return to OR 8/21 and 8/24 for washouts.  - Washout 8/28 in OR. Multiple units of blood products in OR.   - Chest closed on 9/2 - Hgb 8.1 today.   4. Acute hypoxemic respiratory failure - Off ECMO.  - Vent support per CCM - Perc Trach placed 9/7  - He has completed course of Vanc + meropenem.  - s/p bronchoscopy w/ BAL 9/4.  - tracheal aspirate 09/13 growing few GPC and rare yeast  - Back on IV abx as above d/t concern for sepsis. Management per CCM  5. AKI due to ATN - CRRT started 08/28.  - Remains on anuric  and on CRRT, nephrology following  - Plan for transition to iHD soon   6. 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 Now off colchicine with CRRT.  7. 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 Continue aspirin 81 mg daily  8. DM2 - continue SSI   9. PAF/AFL - Tolerates poorly.  - Recurrent AFL. S/p DCCV  to SR 08/29.  - Off amio d/t bradycardia - Maintaining NSR - Not on AC for now d/t bleeding, DVT dose heparin.   10. ID - Completed vancomycin/meropenem with open chest.  -Abx plan as above  11. Neuro -Follows commands off sedation  12. FEN - TFs ongoing. Now tolerating  13. Hyperkalemia - resolved with RRT.   CRITICAL CARE Performed by: Glori Bickers  Total critical care time: 35 minutes  Critical care time was exclusive of separately billable procedures and treating other  patients.  Critical care was necessary to treat or prevent imminent or life-threatening deterioration.  Critical care was time spent personally by me (independent of midlevel providers or residents) on the following activities: development of treatment plan with patient and/or surrogate as well as nursing, discussions with consultants, evaluation of patient's response to treatment, examination of patient, obtaining history from patient or surrogate, ordering and performing treatments and interventions, ordering and review of laboratory studies, ordering and review of radiographic studies, pulse oximetry and re-evaluation of patient's condition.   Glori Bickers, MD  10:29 AM

## 2021-10-27 NOTE — Progress Notes (Signed)
[facility] Progress Note  Name: [name] MRN: [id number] DOB: 1960/09/08   Subjective:  Stable on CRRT. No complaints this morning. Epi requirement decreased to 56mcg. Negative 1.5L for last 24 hours.    Intake/Output Summary (Last 24 hours) at 10/27/2021 0945 Last data filed at 10/27/2021 0900 Gross per 24 hour  Intake 3567.58 ml  Output 4609.6 ml  Net -1042.02 ml    Vitals:  Vitals:   10/27/21 0700 10/27/21 0732 10/27/21 0800 10/27/21 0900  BP:      Pulse: 74 78 75 80  Resp: 20 (!) 24 20 (!) 23  Temp:      TempSrc:      SpO2: 98% 99% 100% 97%  Weight:      Height:         Physical Exam:          General lying in bed, ill appearing HEENT normocephalic atraumatic   Neck trach in place on TC Lungs coarse mechanical breath sounds, no rales, bilateral chest rise Heart no rub, normal rate Abdomen soft nontender nondistended Extremities trace edema in hips  Neuro - nods to questioning Access: RIJ nontunneled catheter    Medications reviewed   Labs:     Latest Ref Rng & Units 10/27/2021    4:28 AM 10/26/2021    3:50 PM 10/26/2021    4:29 AM  BMP  Glucose 70 - 99 mg/dL 144  148  250   BUN 8 - 23 mg/dL 15  13  15    Creatinine 0.61 - 1.24 mg/dL 1.20  1.16  1.09   Sodium 135 - 145 mmol/L 138  138  136   Potassium 3.5 - 5.1 mmol/L 4.3  4.2  3.9   Chloride 98 - 111 mmol/L 103  110  102   CO2 22 - 32 mmol/L 24  25  25    Calcium 8.9 - 10.3 mg/dL 8.6  7.6  8.2      Assessment/Plan:   Pt is a 61 y.o. yo male  with history of HTN, HLD, DM, A-fib, stroke, CKD, CAD multivessel dz status post CABG on 8/3 presented with shortness of breath, found to have cardiac tamponade, course complicated by cardiac arrest, use of ECMO, seen as a consultation for the evaluation of AKI and fluid volume management.   #Acute kidney injury on CKD IIIa, nonoliguric: Ischemic ATN due to cardiogenic shock/cardiac arrest and multiple hemodynamic changes.  Started CRRT on 8/28  for decreasing urine output and fluid/volume management.  Brief (less than 24 hours) line free interval after clotting and CRRT resumed  on 9/9 after nontunneled line with critical care - Continue CRRT  - on 4K Fluids; decreased DFR today given excellent clearance -Replace phos PRN - team would prefer citrate if agent needed for clotting -  No anticoagulation currently but did clot today.  If the patient starts to clot more frequently will consider starting citrate - transition to iHD as able. Vasopressor support persistent - Team asked about PICC today; given his overall state and long term dialysis candidacy fairly poor I think this is acceptable at this time.   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  decannulated  with placement of Impella.  S/p closure of the sternum and mediastinal washout on 9/2.   #Cardiogenic shock/cardiac arrest: Was on ECMO before. Continue pressors per primary team.  Impella out on 9/7    #Acute hypoxic respiratory failure:  improving - now on TC.  optimizing volume with CRRT    #Acute blood loss anemia: Monitor hemoglobin and transfuse as needed by ICU team.  Aranesp 40 mcg weekly on Mondays started on 9/4.  Increased dose to 100 mcg weekly      #Hypophosphatemia due to CRRT, replete sodium phosphate as needed  Disposition - continue ICU monitoring  Reesa Chew, MD 10/27/2021 9:45 AM

## 2021-10-27 NOTE — Progress Notes (Signed)
Peripherally Inserted Central Catheter Placement  The IV Nurse has discussed with the patient and/or persons authorized to consent for the patient, the purpose of this procedure and the potential benefits and risks involved with this procedure.  The benefits include less needle sticks, lab draws from the catheter, and the patient may be discharged home with the catheter. Risks include, but not limited to, infection, bleeding, blood clot (thrombus formation), and puncture of an artery; nerve damage and irregular heartbeat and possibility to perform a PICC exchange if needed/ordered by physician.  Alternatives to this procedure were also discussed.  Bard Power PICC patient education guide, fact sheet on infection prevention and patient information card has been provided to patient /or left at bedside.  PICC inserted by Jake Samples, RN   PICC Placement Documentation  PICC Triple Lumen 10/27/21 Left Brachial 45 cm 1 cm (Active)  Indication for Insertion or Continuance of Line Vasoactive infusions;Prolonged intravenous therapies 10/27/21 1613  Exposed Catheter (cm) 1 cm 10/27/21 1613  Site Assessment Clean, Dry, Intact 10/27/21 1613  Lumen #1 Status Flushed;Saline locked;Blood return noted 10/27/21 1613  Lumen #2 Status Flushed;Saline locked;Blood return noted 10/27/21 1613  Lumen #3 Status Flushed;Saline locked;Blood return noted 10/27/21 1613  Dressing Type Transparent;Securing device 10/27/21 1613  Dressing Status Antimicrobial disc in place;Clean, Dry, Intact 10/27/21 1613  Safety Lock Not Applicable 95/32/02 3343  Line Care Connections checked and tightened 10/27/21 1613  Line Adjustment (NICU/IV Team Only) No 10/27/21 1613  Dressing Intervention New dressing 10/27/21 1613       Autum Benfer, Nicolette Bang 10/27/2021, 4:14 PM

## 2021-10-27 NOTE — Progress Notes (Signed)
Telephone consent obtained from wife for PICC.

## 2021-10-28 LAB — CULTURE, BLOOD (ROUTINE X 2)
Culture: NO GROWTH
Culture: NO GROWTH
Special Requests: ADEQUATE
Special Requests: ADEQUATE

## 2021-10-28 LAB — RENAL FUNCTION PANEL
Albumin: 2.3 g/dL — ABNORMAL LOW (ref 3.5–5.0)
Albumin: 2.3 g/dL — ABNORMAL LOW (ref 3.5–5.0)
Anion gap: 11 (ref 5–15)
Anion gap: 8 (ref 5–15)
BUN: 21 mg/dL (ref 8–23)
BUN: 20 mg/dL (ref 8–23)
CO2: 24 mmol/L (ref 22–32)
CO2: 25 mmol/L (ref 22–32)
Calcium: 8.2 mg/dL — ABNORMAL LOW (ref 8.9–10.3)
Calcium: 8.3 mg/dL — ABNORMAL LOW (ref 8.9–10.3)
Chloride: 103 mmol/L (ref 98–111)
Chloride: 105 mmol/L (ref 98–111)
Creatinine, Ser: 1.65 mg/dL — ABNORMAL HIGH (ref 0.61–1.24)
Creatinine, Ser: 1.71 mg/dL — ABNORMAL HIGH (ref 0.61–1.24)
GFR, Estimated: 47 mL/min — ABNORMAL LOW (ref >60)
GFR, Estimated: 45 mL/min — ABNORMAL LOW
Glucose, Bld: 192 mg/dL — ABNORMAL HIGH (ref 70–99)
Glucose, Bld: 127 mg/dL — ABNORMAL HIGH (ref 70–99)
Phosphorus: 2.5 mg/dL (ref 2.5–4.6)
Phosphorus: 2.4 mg/dL — ABNORMAL LOW (ref 2.5–4.6)
Potassium: 4 mmol/L (ref 3.5–5.1)
Potassium: 4.3 mmol/L (ref 3.5–5.1)
Sodium: 138 mmol/L (ref 135–145)
Sodium: 138 mmol/L (ref 135–145)

## 2021-10-28 LAB — CBC
HCT: 25.8 % — ABNORMAL LOW (ref 39.0–52.0)
Hemoglobin: 7.6 g/dL — ABNORMAL LOW (ref 13.0–17.0)
MCH: 28.9 pg (ref 26.0–34.0)
MCHC: 29.5 g/dL — ABNORMAL LOW (ref 30.0–36.0)
MCV: 98.1 fL (ref 80.0–100.0)
Platelets: 345 K/uL (ref 150–400)
RBC: 2.63 MIL/uL — ABNORMAL LOW (ref 4.22–5.81)
RDW: 19.9 % — ABNORMAL HIGH (ref 11.5–15.5)
WBC: 17.2 K/uL — ABNORMAL HIGH (ref 4.0–10.5)
nRBC: 4.2 % — ABNORMAL HIGH (ref 0.0–0.2)

## 2021-10-28 LAB — COOXEMETRY PANEL
Carboxyhemoglobin: 2.4 % — ABNORMAL HIGH (ref 0.5–1.5)
Methemoglobin: <0.7 % (ref 0.0–1.5)
O2 Saturation: 62.8 %
Total hemoglobin: 7.6 g/dL — ABNORMAL LOW (ref 12.0–16.0)

## 2021-10-28 LAB — HEMOGLOBIN AND HEMATOCRIT, BLOOD
HCT: 28.6 % — ABNORMAL LOW (ref 39.0–52.0)
Hemoglobin: 8.4 g/dL — ABNORMAL LOW (ref 13.0–17.0)

## 2021-10-28 LAB — PATHOLOGIST SMEAR REVIEW

## 2021-10-28 LAB — GLUCOSE, CAPILLARY
Glucose-Capillary: 124 mg/dL — ABNORMAL HIGH (ref 70–99)
Glucose-Capillary: 133 mg/dL — ABNORMAL HIGH (ref 70–99)
Glucose-Capillary: 149 mg/dL — ABNORMAL HIGH (ref 70–99)
Glucose-Capillary: 159 mg/dL — ABNORMAL HIGH (ref 70–99)
Glucose-Capillary: 190 mg/dL — ABNORMAL HIGH (ref 70–99)
Glucose-Capillary: 205 mg/dL — ABNORMAL HIGH (ref 70–99)

## 2021-10-28 LAB — MAGNESIUM: Magnesium: 2.5 mg/dL — ABNORMAL HIGH (ref 1.7–2.4)

## 2021-10-28 MED ORDER — NUTRISOURCE FIBER PO PACK
1.0000 | PACK | Freq: Two times a day (BID) | ORAL | Status: DC
  Administered 2021-10-28 – 2021-11-08 (×22): 1
  Filled 2021-10-28 (×22): qty 1

## 2021-10-28 MED ORDER — POLYETHYLENE GLYCOL 3350 17 G PO PACK: 17.0000 g | PACK | Freq: Every day | ORAL | Status: DC | PRN

## 2021-10-28 MED ORDER — WHITE PETROLATUM EX OINT
TOPICAL_OINTMENT | CUTANEOUS | Status: DC | PRN
  Filled 2021-10-28: qty 28.35

## 2021-10-28 MED ORDER — JUVEN PO PACK
1.0000 | PACK | Freq: Two times a day (BID) | ORAL | Status: DC
  Administered 2021-10-29 – 2021-11-20 (×39): 1
  Filled 2021-10-28 (×42): qty 1

## 2021-10-28 NOTE — Progress Notes (Signed)
Nutrition Follow-up  DOCUMENTATION CODES:   Not applicable  INTERVENTION:    Tube Feeding via Cortrak (tip at the duodenal jejunal flexure): Vital 1.5 at 60 ml/hr Pro-source TF20 60 mL TID This provides 2400 kcals, 157 g of protein and 1094 mL of free water  Continue aggressive phosphorus supplementation while on CRRT  Add Folic Acid 1 mg daily, Thiamine 100 mg daily Continue Renal MVI daily Plan to go ahead and supplement Vit C 250 mg BID and Zinc Sulfate 220 mg BID given increased risk for deficiency and non healing wounds. RD can adjust as needed once lab results return  -Add 1 packet Juven BID, each packet provides 95 calories, 2.5 grams of protein (collagen), and 9.8 grams of carbohydrate (3 grams sugar); also contains 7 grams of L-arginine and L-glutamine, 300 mg vitamin C, 15 mg vitamin E, 1.2 mcg vitamin B-12, 9.5 mg zinc, 200 mg calcium, and 1.5 g  Calcium Beta-hydroxy-Beta-methylbutyrate to support wound healing   NUTRITION DIAGNOSIS:   Inadequate oral intake related to acute illness as evidenced by NPO status.  Being addressed via TF   GOAL:   Patient will meet greater than or equal to 90% of their needs  Progressing  MONITOR:   Vent status, Labs, Weight trends, TF tolerance  REASON FOR ASSESSMENT:   Ventilator    ASSESSMENT:   61 yo male admitted with acute on chronic CHF with recent CABG, symptomatic bradycardia and hypotension. PMH includes DM, CAD s/p recent CABG x 5, CKD 3, CVA  Spoke with pt, he reports that he had just stood.  Pt has remained on trach collar x 4 days, weaning pressors, per MD no sign of renal recovery   8/19 Pericardial window, evacuation of pericardial and pleural effusion 8/20 Intubated, Bedside mediastinal re-exploration, cardiac tamponade, mediastinal clot; PEA arrest due to tamponade, VA ECMO cannulation 8/21 Return to OR for bleeding, mediastinal re-exploration, Cortrak placed-gastric 8/23 TEE with EF 25-30% 8/25 Cortrak  advanced to Post-Pyloric position 8/28 OR: VA ECMO decannulation, Impella placed, mediastinal washout, CRRT initiated 8/29 Brief arrest 9/02 OR: mediastinal washout, sternal closure 9/05 Vomiting large volume, TF held, NG placed to LIS, watery, foul smelling diarrhea, KUB negative for ileus/obstruction, confirmed Cortrak is post pyloric 9/07 OR: Impella removed, Trach placed 9/08 CRRT held 9/09 CRRT restarted, MD changed to Nepro 9/10 TF off due to intolerance 9/11 Switched back to Nepro with better tolerace 9/13 Vital 1.5 advanced to goal  9/14 Cortrak advanced to the duodenal jejunal flexure   Pt remains on vent support via trach, remains on CRRT (4K fluids) UF: 3403 ml   Micronutrient Profile:  CRP: 74.1 (H) Folic Acid: 5.2 (L) Thiamine: 165 (wdl) Vitamin A: 31.3 Vitamin C: 0.3 (L)  Vitamin D: 15.51  (L) Vitamin B12: 2063 (H)  Copper: 81 Zinc: 101  Labs: reviewed CBG's: 124-205  Meds: Vitamin C 250 mg BID (2/87), colace, folic acid, SSI, 2 units novolog every 4 hours, 10 units Semglee daily (9/13), Rena-vite, aransep, nutrisource fiber BID, protonix, thiamine, zinc sulfate 220 mg (9/13 - 9/27) Levophed @ 5 mcg Vasopressin @ .03 units   Diet Order:   Diet Order             Diet NPO time specified  Diet effective midnight                   EDUCATION NEEDS:   Not appropriate for education at this time  Skin:  Skin Assessment: Skin Integrity Issues: Skin Integrity Issues::  Unstageable Stage II: R upper buttock, L ischium Unstageable: L upper  buttock, gluteal cleft/distal sacrum Incisions: chest (closed, wound VAC removed) Other: sheer/friction to right scapula region  Last BM:  400 ml via rectal tube  Height:   Ht Readings from Last 1 Encounters:  10/23/21 5\' 7"  (1.702 m)    Weight:   Wt Readings from Last 1 Encounters:  10/28/21 89.3 kg     BMI:  Body mass index is 30.83 kg/m.  Estimated Nutritional Needs:   Kcal:  2200-2400  kcals  Protein:  135-165 g  Fluid:  1.8 L  [name] P., RD, LDN, CNSC See AMiON for contact information

## 2021-10-28 NOTE — Progress Notes (Signed)
IR was requested for image guided tunneled dialysis catheter placement.   Patient currently receives HD via R IJ temp cath placed on 10/19/21.  Patient still on pressor and hypotensive (85/46,)  has trending down leukocytosis but still on abx.  IR aware blood cx obtained on 9/13 and it's NGTD.  IR will follow the patient and place tunneled dialysis catheter when BP more stable and pt finishes abx - hopefully late this week.   Formal consult to follow.  Please call IR for questions and concerns.     Armando Gang Yaw Escoto PA-C 10/28/2021 10:43 AM

## 2021-10-28 NOTE — Progress Notes (Signed)
11 Days Post-Op Procedure(s) (LRB): REMOVAL OF IMPELLA LEFT VENTRICULAR ASSIST DEVICE (Right) TRANSESOPHAGEAL ECHOCARDIOGRAM (TEE) (N/A) PERCUTANEOUS TRACHEOSTOMY USING SHILEY FLEXIBLE 8 mm CUFFED TRACH. (N/A) Subjective:  Stable day. Vasopressin weaned off. Still on NE 8. MAP 68-70. CVP 8 Co-ox 63% this am.  Objective: Vital signs in last 24 hours: Temp:  [98 F (36.7 C)-98.6 F (37 C)] 98.2 F (36.8 C) (09/18 1600) Pulse Rate:  [74-95] 82 (09/18 1800) Cardiac Rhythm: Normal sinus rhythm (09/18 0800) Resp:  [12-37] 16 (09/18 1800) BP: (85-136)/(46-86) 106/51 (09/18 1800) SpO2:  [94 %-100 %] 100 % (09/18 1800) FiO2 (%):  [28 %] 28 % (09/18 1553) Weight:  [89.3 kg] 89.3 kg (09/18 0500)  Hemodynamic parameters for last 24 hours: CVP:  [8 mmHg-17 mmHg] 8 mmHg  Intake/Output from previous day: 09/17 0701 - 09/18 0700 In: 3417.9 [I.V.:759.8; NG/GT:1995; IV Piggyback:633.1] Out: 3428 [Emesis/NG output:50; Stool:125] Intake/Output this shift: Total I/O In: 943 [I.V.:133.1; NG/GT:710; IV Piggyback:99.9] Out: 1268 [Stool:435]  General appearance: alert, cooperative, and calm today on TC Neurologic: intact Heart: regular rate and rhythm, S1, S2 normal, no murmur Lungs: rhonchi bilaterally Abdomen: soft, non-tender; bowel sounds normal Extremities: edema minimal Wound: chest incision intact with some skin edge necrosis but no sign of infection.   Lab Results: Recent Labs    10/27/21 0428 10/28/21 0415 10/28/21 1556  WBC 22.9* 17.2*  --   HGB 8.1* 7.6* 8.4*  HCT 26.5* 25.8* 28.6*  PLT 451* 345  --    BMET:  Recent Labs    10/28/21 0415 10/28/21 1556  NA 138 138  K 4.0 4.3  CL 103 105  CO2 24 25  GLUCOSE 192* 127*  BUN 21 20  CREATININE 1.65* 1.71*  CALCIUM 8.2* 8.3*    PT/INR: No results for input(s): "LABPROT", "INR" in the last 72 hours. ABG    Component Value Date/Time   PHART 7.301 (L) 10/22/2021 2354   HCO3 19.3 (L) 10/22/2021 2354   TCO2 20 (L)  10/22/2021 2354   ACIDBASEDEF 7.0 (H) 10/22/2021 2354   O2SAT 62.8 10/28/2021 0414   CBG (last 3)  Recent Labs    10/28/21 0422 10/28/21 0730 10/28/21 1128  GLUCAP 205* 124* 159*    Assessment/Plan: S/P Procedure(s) (LRB): REMOVAL OF IMPELLA LEFT VENTRICULAR ASSIST DEVICE (Right) TRANSESOPHAGEAL ECHOCARDIOGRAM (TEE) (N/A) PERCUTANEOUS TRACHEOSTOMY USING SHILEY FLEXIBLE 8 mm CUFFED TRACH. (N/A)  Hemodynamics fairly stable. Wean NE as tolerated and continue midodrine.   Continue CRRT to keep even with lower CVP and wt 25 lbs below preop wt.  Stable on trach collar.  Tolerating tube feeds at goal.  Dressing changes for chest tube sites and sacral pressure sores.  Continue PT/OT. Reportedly was able to stand up today and support weight well which is surprising.    LOS: 30 days    [name] 10/28/2021

## 2021-10-28 NOTE — Progress Notes (Signed)
NAME:  [name], MRN:  [id number], DOB:  [birth year], LOS: 73 ADMISSION DATE:  09/28/2021, CONSULTATION DATE:  09/29/2021 REFERRING MD:  Kipp Brood - TCTS CHIEF COMPLAINT: Dyspnea   History of Present Illness:  61 year old man who underwent CABG on 8/3, readmitted on 8/19 in setting of dyspnea with a pericardial effusion requiring pericardial window via bedside sternotomy. Also with bilateral pleural effusions. Sustained PEA arrest on 8/20, briefly required [facility] ECMO which was discontinued on 8/28, Impella placed 8/28, started on CRRT.  Hypoxemia, repeat cardiac arrest on 8/30. Underwent mediastinal washout/sternotomy closure 9/2. Impella removed and tracheostomy completed 9/7.  Pertinent Medical History:  CAD s/p CABG 8/3 DM2 Diverticulosis GERD Hyperlipidemia Hypertension History of stroke Tobacco Abuse  Significant Hospital Events: Including procedures, antibiotic start and stop dates in addition to other pertinent events   8/19 Admitted, s/p pericardial window 8/20 PEA cardiac arrest, left pigtail chest tube placement, PEA cardiac arrest due to tamponade, bedside sternotomy with pericardial drains placed. 8/21 Return to OR for overnight bleeding.  8/22 Minimal chest tube output.  Atrial fibrillation, controled with amiodarone.  8/22 TEE showed EF 35% at baseline, which improved significantly with decreasing ECMO flow.  8/23 Tolerated diuresis  8/24 Mediastinal washout, removal of hematoma 8/25 Ongoing bleeding issues from posterior mediastinum 8/28 Decannulated and off ECMO, 5.5 Impella placed, L femoral HD cath placed and started on CRRT 8/30 Hypoxia and hypotension followed by brief arrest, gentle L lateral chest compressions performed and ROSC in roughly 1 minute. 8/31 Remains deeply sedated on ventilator with open chest.  No acute events overnight. 9/1 No acute events overnight, remains on ventilator deep sedation.  Tolerating aggressive volume removal per CRRT 9/2 Sternotomy  closure 9/3 Wean versed 9/4 FOB, low grade temps, notable thick mucus/frequent suctioning 9/5 Vomiting, ?mucus plugging. PSV 5-6 hours on 10/5 9/6 Impella down to p3 9/7 Impella removal and trach. PRBC and DDAVP in OR  9/8 Low dose epi, weaned on 8/8 9/9 RIJ HD catheter placed 9/11 Started on ATC at 28% and tolerating well 9/12 Remains on 28% ATC 9/13 SB/Junction rhythm with associated hypotension overnight requiring transient pressor escalation. Pressor requirement down in AM. WBC count 23 (18), Co-ox 36%. Resp/Blood Cx. 9/14 Mild bradycardia/relative hypotension with Precedex. Remains on Epi 30mcg, Vaso 0.03. Resp Cx with few gram+ cocci (clusters), yeast. BCx NGTD. 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 Co-ox 48%. CRRT stopped. 9/15 WBC continues to downtrend. Hgb 7.6. Net +1.49L/24H. CRRT resumed. Co-ox 51%. NE started to help with Epi wean. 9/16, Co. oximetry improved, WBC trending down, weaning pressors  Interim History / Subjective:   Now tolerating trach collar trials for over 4 days. Still no sign of renal recovery. Weaning off pressors.   Objective:  Blood pressure 108/64, pulse 76, temperature 98.6 F (37 C), temperature source Oral, resp. rate 16, height 5\' 7"  (1.702 m), weight 89.3 kg, SpO2 100 %. CVP:  [9 mmHg-17 mmHg] 9 mmHg  FiO2 (%):  [28 %] 28 %   Intake/Output Summary (Last 24 hours) at 10/28/2021 0828 Last data filed at 10/28/2021 0800 Gross per 24 hour  Intake 2994.85 ml  Output 3458 ml  Net -463.15 ml    Filed Weights   10/26/21 0500 10/27/21 0500 10/28/21 0500  Weight: 89.4 kg 90.3 kg 89.3 kg   Physical Examination: General: Acutely ill-appearing middle-aged man in NAD. Appears uncomfortable. HEENT: [place]/AT, anicteric sclera, PERRL, moist mucous membranes. Trach in place. Neuro:  Awake, unable to assess orientation  but nodding appropriately/mouthing words to questions.  Responds to verbal stimuli. Following commands  intermittently. Moves all 4 extremities spontaneously. CV: RRR, no m/g/r. PULM: Mildly tachypneic, breathing unlabored on ATC (5L, FiO2 28%). Lung fields with bilateral coarse rhonchi, diminished at bilateral bases. GI: Soft, nontender, nondistended. Hypoactive bowel sounds. Extremities: Bilateral symmetric 1+ LE edema noted. Skin: Warm/dry, no rashes.  Resolved Hospital Problem List:   Acute RV failure > resolved  Assessment & Plan:   Acute metabolic encephalopathy Required Dilaudid gtt discontinuation 9/13 early AM for hypotension/bradycardia. -Precedex weaned off 9/16 - Dilaudid as needed for dressing changes - Continue oxycodone  - Continue scheduled Klonopin, Seroquel - Goal RASS 0 to -1 - Delirium precautions - PT/OT/SLP as clinically appropriate  Hemorrhagic pericarditis with tamponade PEA arrest due to tamponade, s/p emergent sternotomy Cardiogenic & hemorrhagic shock, s/p VA ECMO (decannulated 8/29), s/p Impella (removed 9/7) CAD s/p CABG x3 Afib  Echo with RV dysfunction, LV okay, trace pericardial effusion. Overnight 9/12-9/13 entered into SB then junctional rhythm with associated hypotension. Amio gtt stopped. - Continue Epi gtt titrated to goal MAP (>65), holding that 1 now given RV dysfunction - Continue vasopressin and norepinephrine, wean as able - Continue midodrine - Amiodarone discontinued in the setting of bradycardia/transient junctional rhythm - ASA/statin - Trend Co-ox, improved and stable - Remainder per HF/TCTS  Leukocytosis with severe sepsis WBC has been trending down now back to despite daptomycin, meropenem, micafungin.  No clear source of infection.  Does have secretions and chest imaging findings, pneumonia most likely source with OP for almost respiratory culture. -Wean vaso and norepi, levo epi at 1, MAP greater than 65 -MRSA culture finalized with OP flora - Blood Cx NGTD - Vanc transition to Dapto c/f VRE now discontinued (9/13 > 9/17) -  Meropenem to continue with ongoing secretions, consider de-escalating given OP flora on culture (9/14 > ) - Micafungin now discontinued (9/14 > 9/17)  Acute respiratory failure with hypoxemia and hypercarbia S/p Tracheostomy  - Tolerating ATC since 9/11 - Continue supplemental O2 support - Wean FiO2 for O2 sat > 90%, current ATC 28% FiO2 - VAP bundle - Routine trach care - Pulmonary hygiene (CPT/nebs, ongoing intermittent mucus plugging) - PAD protocol for sedation: Precedex and Dilaudid for goal RASS 0 to -1, wean precedex off   AKI with anuria  Hypophosphatemia - CRRT per nephrology, goal net even to slightly net positive -After discussion with TTTS nephrology, okay for PICC placement, ordered 10/27/2021 -Permcath placement.   ABLA, previous hemorrhagic shock Thrombocytosis  - Trend H&H - Monitor for signs of active bleeding - Transfuse for Hgb < 7.0 or hemodynamically significant bleeding - Consider threshold of Hgb 8.0 given patient's history of CAD, defer to heart failure/TCTS for transfusion decision  Maculopapular rash- only new thing is banatrol. No history of food allergies. Banatrol stopped 9/10. - Continue to monitor - Benadryl as needed  Vomiting Diarrhea, improving - TF via Cotrak - Hold bowel regimen as clinically appropriate  DM II with hyperglycemia - Basal Semglee 10U daily, consider increase if glucoses remain uncontrolled - SSI, resistant scale - CBGs Q4H - Goal CBG 140-180  At Risk Malnutrition  - TF via Cortrak  Sacral pressure wound - WOC following  Lines/Tubes/Drains - FMS (8/25)  - Trach (9/7) - L Fem HD cath (8/28 > 9/8) - LIJ CVC (8/20 > 9/10)  - L rad art line (8/19 > 9/13) - R rad art line (9/13 > ) - RIJ HD catheter (9/9 > ) -  PICC ordered 10/27/2021  Best Practice (right click and "Reselect all SmartList Selections" daily)  Diet/type: tubefeeds  DVT prophylaxis: prophylactic heparin  GI prophylaxis: PPI Lines: Central line,  Dialysis Catheter, and Arterial Line Foley:  N/A Code Status:  full code Last date of multidisciplinary goals of care discussion [Per Primary]  Critical care time:   CRITICAL CARE Performed by: Kipp Brood   Total critical care time: 40 minutes  Critical care time was exclusive of separately billable procedures and treating other patients.  Critical care was necessary to treat or prevent imminent or life-threatening deterioration.  Critical care was time spent personally by me on the following activities: development of treatment plan with patient and/or surrogate as well as nursing, discussions with consultants, evaluation of patient's response to treatment, examination of patient, obtaining history from patient or surrogate, ordering and performing treatments and interventions, ordering and review of laboratory studies, ordering and review of radiographic studies, pulse oximetry and re-evaluation of patient's condition.   Kipp Brood, MD [facility] 10/28/21 8:28 AM  Please see [email or web address] for contact info details.  From 7A-7P if no response, please call [phone] After hours, please call ELink [phone]

## 2021-10-28 NOTE — Progress Notes (Signed)
Occupational Therapy Treatment Patient Details Name: [name] MRN: [id number] DOB: 01-15-1961 Today's Date: 10/28/2021   History of present illness Pt is a 61 y.o. male admitted 09/28/21 with SOB, bradycardia, hypotension. Workup for bilateral pleural effusion, pericardial effusion s/p VATS with pericardial window 8/19. Cardiac arrest with tamponade 8/20, intubated with bedside mediastinal exploration, ECMO via fem access. Pt with mediastinal hemorrhage s/p reexploration 8/21. To OR 8/28 for washout, ECMO decannulation, Impella placement. CRRT initiated 8/28. Brief PEA arrest 8/29 with DCCV to NSR.  S/p chest closure 9/2. S/p trach and impella removed 9/7. S/p wound vac removal 9/11. PMH includes CAD (s/p recent CABG 09/12/21), CKD2, DM, HTN, AKI, afib, CVA.   OT comments  Pt sidelying upon entry and agreeable to OT session.  Completing exercises with BUEs, reports R UE feels different than normal and difficulty noted with coordination.  Requires AAROM to for close to full R hand grasp and increased time to complete extension, cueing during exercises to complete full ROM with AAROM needed at times for shoulder and elbow. Engaged in grooming tasks with max assist to sequence and coordinate washing hands and face, tends to drop washcloth during task.  Demonstrating improved attention during this session, following simple commands consistently and asking "what happened" during session.  Recalled year after education, initially reporting 2026.  Will follow acutely.    Recommendations for follow up therapy are one component of a multi-disciplinary discharge planning process, led by the attending physician.  Recommendations may be updated based on patient status, additional functional criteria and insurance authorization.    Follow Up Recommendations  OT at Long-term acute care hospital    Assistance Recommended at Discharge Frequent or constant Supervision/Assistance  Patient can return home with the  following  Two people to help with bathing/dressing/bathroom;Two people to help with walking and/or transfers;Direct supervision/assist for medications management;Direct supervision/assist for financial management;Assist for transportation;Help with stairs or ramp for entrance;Assistance with cooking/housework   Equipment Recommendations  Other (comment) (defer)    Recommendations for Other Services      Precautions / Restrictions Precautions Precautions: Sternal;Fall;Other (comment) Precaution Booklet Issued: No Precaution Comments: CRRT, trach, flexiseal, cortrak Restrictions Weight Bearing Restrictions: Yes Other Position/Activity Restrictions: sternal       Mobility Bed Mobility               General bed mobility comments: remained bed level during session    Transfers                         Balance                                           ADL either performed or assessed with clinical judgement   ADL Overall ADL's : Needs assistance/impaired     Grooming: Wash/dry hands;Wash/dry face;Maximal assistance;Bed level Grooming Details (indicate cue type and reason): max hand over hand assist to maintain grasp and sequence hand washing and washing face, poor coordination and dropping wash cloth freqently                               General ADL Comments: Total A for ADLs    Extremity/Trunk Assessment              Vision       Perception  Praxis      Cognition Arousal/Alertness: Awake/alert Behavior During Therapy: Flat affect Overall Cognitive Status: Impaired/Different from baseline Area of Impairment: Attention, Memory, Orientation, Following commands, Awareness, Problem solving                 Orientation Level: Disoriented to, Time, Situation Current Attention Level: Sustained Memory: Decreased short-term memory, Decreased recall of precautions Following Commands: Follows one step commands  with increased time, Follows one step commands consistently   Awareness: Intellectual Problem Solving: Slow processing, Difficulty sequencing, Decreased initiation, Requires verbal cues, Requires tactile cues General Comments: pt mouthing words today, oriented to place but not time (reports 2026) but able to recall after several minutes.  mouths "what happened" with no recall of events.  Follows simple commands with increased time but noted difficulty sequencing ADL tasks.        Exercises Exercises: General Upper Extremity General Exercises - Upper Extremity Shoulder Flexion: AAROM, Both, Sidelying (8 reps) Elbow Flexion: AAROM, Both, Sidelying (8 reps) Elbow Extension: AAROM, Both, Sidelying (8 reps) Wrist Flexion: AROM, Both, Sidelying (8 reps) Wrist Extension: AAROM, Both, Sidelying (8 reps) Digit Composite Flexion: AROM, AAROM, Both, Sidelying (8 reps) Composite Extension: AROM, Both, Sidelying (8 reps) Other Exercises Other Exercises: functional reaching with R UE x 5 to touch therapists hand    Shoulder Instructions       General Comments HR 81, RR 17, BP 102/51 Aline, SpOw 100% on TC 5L 28%    Pertinent Vitals/ Pain       Pain Assessment Pain Assessment: Faces Faces Pain Scale: Hurts a little bit Pain Location: sacrum Pain Descriptors / Indicators: Sore Pain Intervention(s): Monitored during session, Limited activity within patient's tolerance  Home Living                                          Prior Functioning/Environment              Frequency  Min 2X/week        Progress Toward Goals  OT Goals(current goals can now be found in the care plan section)  Progress towards OT goals: Progressing toward goals  Acute Rehab OT Goals Patient Stated Goal: none stated OT Goal Formulation: With patient Time For Goal Achievement: 11/04/21 Potential to Achieve Goals: Good  Plan Frequency remains appropriate;Discharge plan needs to be  updated    Co-evaluation                 AM-PAC OT "6 Clicks" Daily Activity     Outcome Measure   Help from another person eating meals?: Total Help from another person taking care of personal grooming?: Total Help from another person toileting, which includes using toliet, bedpan, or urinal?: Total Help from another person bathing (including washing, rinsing, drying)?: Total Help from another person to put on and taking off regular upper body clothing?: Total Help from another person to put on and taking off regular lower body clothing?: Total 6 Click Score: 6    End of Session Equipment Utilized During Treatment: Oxygen  OT Visit Diagnosis: Unsteadiness on feet (R26.81);Other abnormalities of gait and mobility (R26.89);Muscle weakness (generalized) (M62.81)   Activity Tolerance Patient tolerated treatment well   Patient Left in bed;with call bell/phone within reach;with bed alarm set   Nurse Communication Mobility status        Time: 1751-0258 OT Time Calculation (min): 21 min  Charges: OT General Charges $OT Visit: 1 Visit OT Treatments $Self Care/Home Management : 8-22 mins  [place] Office [phone]   Delight Stare 10/28/2021, 12:58 PM

## 2021-10-28 NOTE — Progress Notes (Signed)
Subjective:  has new PICC line-  otherwise remains critically ill but fairly stable-  CRRT running well -  still on pressors  Objective Vital signs in last 24 hours: Vitals:   10/28/21 0400 10/28/21 0500 10/28/21 0600 10/28/21 0700  BP:      Pulse: 80 79 79 76  Resp: (!) 26 (!) 26 17 17   Temp:      TempSrc:      SpO2: 99% 99% 99% 100%  Weight:  89.3 kg    Height:       Weight change: -1 kg  Intake/Output Summary (Last 24 hours) at 10/28/2021 0814 Last data filed at 10/28/2021 0700 Gross per 24 hour  Intake 2920.71 ml  Output 3458 ml  Net -537.29 ml   Assessment/Plan:    Pt is a 61 y.o. yo male  with HTN, HLD, DM, A-fib, stroke, CKD, CAD multivessel dz status post CABG on 8/3 presented with shortness of breath, found to have cardiac tamponade, course complicated by cardiac arrest, use of ECMO, seen as a consultation for the evaluation of AKI and fluid volume management.   #Acute kidney injury on CKD IIIa, nonoliguric: Ischemic ATN due to cardiogenic shock/cardiac arrest and multiple hemodynamic changes.  Started CRRT on 8/28 for decreasing urine output and fluid/volume management.  Brief (less than 24 hours) line free interval after clotting and CRRT resumed  on 9/9 after nontunneled line with critical care - Continue CRRT  - on 4K Fluids;  -Replace phos PRN - team would prefer citrate if agent needed for clotting -  No anticoagulation currently.  If the patient starts to clot more frequently will consider starting citrate - transition to iHD as able. Vasopressor support persistent    #Cardiac tamponade/hemorrhagic pericarditis status post pericardial drain placement with subsequent ECMO placement.  decannulated  with placement of Impella.  S/p closure of the sternum and mediastinal washout on 9/2.   #Cardiogenic shock/cardiac arrest: Was on ECMO before. Continue pressors per primary team.  Impella out on 9/7    #Acute hypoxic respiratory failure:  improving - now on TC.   optimizing volume with CRRT -  running even at present    #Acute blood loss anemia: Monitor hemoglobin and transfuse as needed by ICU team.  Aranesp 40 mcg weekly on Mondays started on 9/4.  Increased dose to 100 mcg weekly      #Hypophosphatemia due to CRRT, replete sodium phosphate as needed     [name]    Labs: Basic Metabolic Panel: Recent Labs  Lab 10/27/21 0428 10/27/21 1717 10/28/21 0415  NA 138 139 138  K 4.3 4.1 4.0  CL 103 104 103  CO2 24 24 24   GLUCOSE 144* 139* 192*  BUN 15 16 21   CREATININE 1.20 1.49* 1.65*  CALCIUM 8.6* 8.7* 8.2*  PHOS 2.0* 3.9 2.5   Liver Function Tests: Recent Labs  Lab 10/22/21 0406 10/22/21 1529 10/27/21 0428 10/27/21 1717 10/28/21 0415  AST 38  --   --   --   --   ALT 22  --   --   --   --   ALKPHOS 137*  --   --   --   --   BILITOT 1.0  --   --   --   --   PROT 6.2*  --   --   --   --   ALBUMIN 2.4*  2.4*   < > 2.4* 2.3* 2.3*   < > = values in  this interval not displayed.   No results for input(s): "LIPASE", "AMYLASE" in the last 168 hours. No results for input(s): "AMMONIA" in the last 168 hours. CBC: Recent Labs  Lab 10/24/21 0332 10/25/21 0339 10/25/21 1500 10/26/21 0429 10/27/21 0428 10/28/21 0415  WBC 21.8* 19.1*  --  18.7* 22.9* 17.2*  HGB 8.2* 7.6*   < > 7.4* 8.1* 7.6*  HCT 26.7* 24.9*   < > 25.1* 26.5* 25.8*  MCV 95.0 94.7  --  98.0 96.0 98.1  PLT 429* 392  --  387 451* 345   < > = values in this interval not displayed.   Cardiac Enzymes: Recent Labs  Lab 10/23/21 1432  CKTOTAL 407*   CBG: Recent Labs  Lab 10/27/21 1216 10/27/21 1720 10/27/21 1959 10/28/21 0015 10/28/21 0422  GLUCAP 175* 127* 167* 190* 205*    Iron Studies: No results for input(s): "IRON", "TIBC", "TRANSFERRIN", "FERRITIN" in the last 72 hours. Studies/Results: [place] EKG SITE RITE  Result Date: 10/27/2021 If Site Rite image not attached, placement could not be confirmed due to current cardiac rhythm.   Medications: Infusions:   prismasol BGK 4/2.5 500 mL/hr at 10/27/21 2127    prismasol BGK 4/2.5 300 mL/hr at 10/28/21 2585   sodium chloride Stopped (10/27/21 1703)   sodium chloride     albumin human Stopped (10/15/21 1901)   epinephrine 0.5 mcg/min (10/28/21 0700)   feeding supplement (VITAL 1.5 CAL) 1,000 mL (10/28/21 0801)   meropenem (MERREM) IV Stopped (10/28/21 2778)   norepinephrine (LEVOPHED) Adult infusion 5 mcg/min (10/28/21 0700)   prismasol BGK 4/2.5 1,300 mL/hr at 10/27/21 2055   vasopressin 0.04 Units/min (10/28/21 0700)    Scheduled Medications:  sodium chloride   Intravenous Once   acetaminophen (TYLENOL) oral liquid 160 mg/5 mL  650 mg Per Tube Q6H   ascorbic acid  250 mg Per Tube BID   aspirin  81 mg Per Tube Daily   Chlorhexidine Gluconate Cloth  6 each Topical Daily   clonazePAM  0.5 mg Per Tube Q8H   collagenase   Topical Daily   darbepoetin (ARANESP) injection - NON-DIALYSIS  100 mcg Subcutaneous Q Mon-1800   docusate  100 mg Per Tube BID   feeding supplement (PROSource TF20)  60 mL Per Tube TID   folic acid  1 mg Per Tube Daily   heparin injection (subcutaneous)  5,000 Units Subcutaneous Q8H   insulin aspart  0-20 Units Subcutaneous Q4H   insulin aspart  2 Units Subcutaneous Q4H   insulin glargine-yfgn  10 Units Subcutaneous QHS   midodrine  15 mg Per Tube TID WC   multivitamin  1 tablet Per Tube QHS   mouth rinse  15 mL Mouth Rinse 4 times per day   oxyCODONE  20 mg Per Tube Q6H   pantoprazole  40 mg Per Tube BID   polyethylene glycol  17 g Per Tube Daily   QUEtiapine  50 mg Per Tube BID   sennosides  5 mL Per Tube QHS   sodium chloride flush  10-40 mL Intracatheter Q12H   sodium chloride flush  10-40 mL Intracatheter Q12H   sodium chloride flush  3 mL Intravenous Q12H   thiamine  100 mg Per Tube Daily   zinc sulfate  220 mg Per Tube Daily    have reviewed scheduled and prn medications.  Physical Exam: General: sedated on vent Heart:  RRR Lungs: mostly clear Abdomen: slightly distended Extremities: min edema Dialysis Access: right IJ dialysis cath-  23 days old     10/28/2021,8:14 AM  LOS: 30 days

## 2021-10-28 NOTE — Progress Notes (Signed)
Physical Therapy Treatment Patient Details Name: [name] MRN: [id number] DOB: Jan 12, 1961 Today's Date: 10/28/2021   History of Present Illness Pt is a 61 y.o. male admitted 09/28/21 with SOB, bradycardia, hypotension. Workup for bilateral pleural effusion, pericardial effusion s/p VATS with pericardial window 8/19. Cardiac arrest with tamponade 8/20, intubated with bedside mediastinal exploration, ECMO via fem access. Pt with mediastinal hemorrhage s/p reexploration 8/21. To OR 8/28 for washout, ECMO decannulation, Impella placement. CRRT initiated 8/28. Brief PEA arrest 8/29 with DCCV to NSR.  S/p chest closure 9/2. S/p trach and impella removed 9/7. S/p wound vac removal 9/11. PMH includes CAD (s/p recent CABG 09/12/21), CKD2, DM, HTN, AKI, afib, CVA.    PT Comments    PT awake, alert responding to questions and commands with increased movement all extremities but remains profoundly weak. Pt tolerating tilt to 50 degrees prior to return to supine total of 12 min tilting. Pt encouraged to continue moving extremities for strengthening and progression. Will continue to follow.    28% trach collar at 98% SpO2 HR 79-84   Recommendations for follow up therapy are one component of a multi-disciplinary discharge planning process, led by the attending physician.  Recommendations may be updated based on patient status, additional functional criteria and insurance authorization.  Follow Up Recommendations  PT at Long-term acute care hospital Can patient physically be transported by private vehicle: No   Assistance Recommended at Discharge Frequent or constant Supervision/Assistance  Patient can return home with the following Two people to help with walking and/or transfers;Two people to help with bathing/dressing/bathroom;Direct supervision/assist for medications management;Assistance with feeding;Assistance with cooking/housework;Direct supervision/assist for financial management;Assist for  transportation   Equipment Recommendations  Other (comment) (TBD with progression)    Recommendations for Other Services       Precautions / Restrictions Precautions Precautions: Sternal;Fall;Other (comment) Precaution Comments: CRRT, trach, flexiseal, cortrak     Mobility  Bed Mobility Overal bed mobility: Needs Assistance Bed Mobility: Rolling Rolling: Mod assist         General bed mobility comments: mod assist to roll right for pad positioning. total +2 to slide up in bed. Bed features to transition from supine<>stand and into chair position    Transfers                   General transfer comment: tilt 15 degrees- 64min 110/56. 30degrees 2 min 104/79, 40 degrees 6 min 123/112, 50 degrees 2 min prior to BP drop with automatic cuff to 92/31 (49) with return to supine and positioned in chair position    Ambulation/Gait               General Gait Details: unable   Stairs             Wheelchair Mobility    Modified Rankin (Stroke Patients Only)       Balance Overall balance assessment: Needs assistance   Sitting balance-Leahy Scale: Poor Sitting balance - Comments: left lean in sitting     Standing balance-Leahy Scale: Poor Standing balance comment: left lean in tilt >40 degrees                            Cognition Arousal/Alertness: Awake/alert Behavior During Therapy: Flat affect Overall Cognitive Status: Impaired/Different from baseline Area of Impairment: Attention, Memory                   Current Attention Level: Sustained Memory: Decreased recall  of precautions, Decreased short-term memory Following Commands: Follows one step commands inconsistently, Follows one step commands with increased time       General Comments: pt able to mouth name, day, place and follow single step commands with increased cue and delay        Exercises General Exercises - Upper Extremity Shoulder Flexion: AAROM, Both, 5  reps, Standing General Exercises - Lower Extremity Short Arc Quad: AAROM, Both, 10 reps, Seated Heel Slides: AAROM, Both, 10 reps, Seated Mini-Sqauts: AAROM, Both, 10 reps, Standing (at tilt of 30 and 40. towel behind knees to prevent hyperextension and tactile cues with feet blocked)    General Comments        Pertinent Vitals/Pain Pain Assessment Pain Score: 4  Pain Location: sacrum Pain Descriptors / Indicators: Sore Pain Intervention(s): Limited activity within patient's tolerance, Repositioned    Home Living                          Prior Function            PT Goals (current goals can now be found in the care plan section) Progress towards PT goals: Progressing toward goals    Frequency    Min 3X/week      PT Plan Current plan remains appropriate    Co-evaluation              AM-PAC PT "6 Clicks" Mobility   Outcome Measure  Help needed turning from your back to your side while in a flat bed without using bedrails?: Total Help needed moving from lying on your back to sitting on the side of a flat bed without using bedrails?: Total Help needed moving to and from a bed to a chair (including a wheelchair)?: Total Help needed standing up from a chair using your arms (e.g., wheelchair or bedside chair)?: Total Help needed to walk in hospital room?: Total Help needed climbing 3-5 steps with a railing? : Total 6 Click Score: 6    End of Session   Activity Tolerance: Patient tolerated treatment well Patient left: in bed;with call bell/phone within reach;with nursing/sitter in room;with SCD's reapplied Nurse Communication: Mobility status PT Visit Diagnosis: Other abnormalities of gait and mobility (R26.89);Difficulty in walking, not elsewhere classified (R26.2);Muscle weakness (generalized) (M62.81)     Time: 8185-6314 PT Time Calculation (min) (ACUTE ONLY): 31 min  Charges:  $Therapeutic Activity: 23-37 mins                     [name],  PT Acute Rehabilitation Services Office: [phone]    [name] [name] 10/28/2021, 11:39 AM

## 2021-10-28 NOTE — Progress Notes (Addendum)
Patient ID: [name], male   DOB: 11/21/1960, 61 y.o.   MRN: [id number]   Advanced Heart Failure Rounding Note   Subjective:    - 8/19 Pericardial window - 8/20 Cardiac arrest with tamponade -> Emergent bedside washout - 09/29/21 VA Cannulation - 09/30/21 Return to OR for mediastinal hemorrhage - 10/01/21 Developed AF -> amio - 10/02/21 TEE EF 25-30%  - 10/04/21 OR for washout. C/b continued bleeding overnight - 10/07/21 Placement of Impella 5.5 with washout, VA ECMO decannulation. Hypotensive with development of severe RV dysfunction after ECMO off and pressors titrated up. Multiple units of blood products. - 10/08/21 Brief PEA arrest. AFL with RVR >> S/p DCCV to SR, back in AFL shortly after - 8/31 Give 1UPRBCs  - 9/2 OR for chest closure - 9/3 Hypotensive overnight w/ SBPs in 80s. Febrile, mTemp 100.8. CRRT paused. VP increased to 0.04.  - 9/4 s/p bronchoscopy w/ BAL by PCCM, Cx NGTD  - 9/5 vomiting w/ large volume NGT output + watery/foul diarrhea, Tube feeds held. No signs of ileus on KUB. C-diff negative   - 9/7 OR for Impella Extraction and percutaneous tracheostomy. 2 u RBCs. - 9/8 CVVH stopped and line removed for holiday - 9/9 CVVH restarted.  -9/13 Worsening leukocytosis and pressor requirements. Purulent drainage from arterial line. Started Daptomycin, Micafungin and Meropenem. - 9/17 Daptomycin + Micafungin stopped. Remains on Meropenum   Remains on CVVHD.  NE 5, Epi 0.5, Vaso 0.03.   CVP 9  Complaining of buttock pain.   Echo 08/28: LV EF 55-60% with severe LVH, small ventricular cavity, RV appears to have near normal systolic function   Objective:     Vital Signs:   Temp:  [97.3 F (36.3 C)-98.2 F (36.8 C)] 98.2 F (36.8 C) (09/18 0300) Pulse Rate:  [74-83] 76 (09/18 0700) Resp:  [12-37] 17 (09/18 0700) SpO2:  [95 %-100 %] 100 % (09/18 0700) FiO2 (%):  [28 %] 28 % (09/18 0400) Weight:  [89.3 kg] 89.3 kg (09/18 0500) Last BM Date : 10/27/21  Weight  change: Filed Weights   10/26/21 0500 10/27/21 0500 10/28/21 0500  Weight: 89.4 kg 90.3 kg 89.3 kg    Intake/Output:   Intake/Output Summary (Last 24 hours) at 10/28/2021 0734 Last data filed at 10/28/2021 0700 Gross per 24 hour  Intake 3417.9 ml  Output 3578 ml  Net -160.1 ml  CVP 9 Exam: General: Trach collar on CRRT HEENT: Cortrak  Neck: trach, supple. no JVD. Carotids 2+ bilat; no bruits. No lymphadenopathy or thryomegaly appreciated. Cor: PMI nondisplaced. Regular rate & rhythm. No rubs, gallops or murmurs. Lungs: clear Abdomen: soft, nontender, nondistended. No hepatosplenomegaly. No bruits or masses. Good bowel sounds. Extremities: no cyanosis, clubbing, rash, edema. LUE PICC  Neuro: Alert . MAE x4   Telemetry: NSR 70-80s    Labs: Basic Metabolic Panel: Recent Labs  Lab 10/24/21 0332 10/24/21 1600 10/25/21 0339 10/25/21 1450 10/26/21 0429 10/26/21 1550 10/27/21 0428 10/27/21 1717 10/28/21 0415  NA 138   < > 139   < > 136 138 138 139 138  K 4.0   < > 4.1   < > 3.9 4.2 4.3 4.1 4.0  CL 105   < > 105   < > 102 110 103 104 103  CO2 23   < > 23   < > _0 GLUCOSE 266*   < > 165*   < > 250* 148* 144* 139* 192*  BUN 19   < >  15   < > _0 CREATININE 1.20   < > 0.98   < > 1.09 1.16 1.20 1.49* 1.65*  CALCIUM 7.9*   < > 8.1*   < > 8.2* 7.6* 8.6* 8.7* 8.2*  MG 2.2  --  2.3  --  2.5*  --  2.7*  --  2.5*  PHOS 3.0   < > 4.2   < > 1.7* 2.8 2.0* 3.9 2.5   < > = values in this interval not displayed.    Liver Function Tests: Recent Labs  Lab 10/22/21 0406 10/22/21 1529 10/26/21 0429 10/26/21 1550 10/27/21 0428 10/27/21 1717 10/28/21 0415  AST 38  --   --   --   --   --   --   ALT 22  --   --   --   --   --   --   ALKPHOS 137*  --   --   --   --   --   --   BILITOT 1.0  --   --   --   --   --   --   PROT 6.2*  --   --   --   --   --   --   ALBUMIN 2.4*  2.4*   < > 2.2* 2.3* 2.4* 2.3* 2.3*   < > = values in this interval not displayed.    No results for input(s): "LIPASE", "AMYLASE" in the last 168 hours. No results for input(s): "AMMONIA" in the last 168 hours.  CBC: Recent Labs  Lab 10/24/21 0332 10/25/21 0339 10/25/21 1500 10/26/21 0429 10/27/21 0428 10/28/21 0415  WBC 21.8* 19.1*  --  18.7* 22.9* 17.2*  HGB 8.2* 7.6* 7.7* 7.4* 8.1* 7.6*  HCT 26.7* 24.9* 24.4* 25.1* 26.5* 25.8*  MCV 95.0 94.7  --  98.0 96.0 98.1  PLT 429* 392  --  387 451* 345    Cardiac Enzymes: Recent Labs  Lab 10/23/21 1432  CKTOTAL 407*    BNP: BNP (last 3 results) Recent Labs    12/24/20 0651 09/28/21 0405  BNP 412.0* 826.6*    ProBNP (last 3 results) No results for input(s): "PROBNP" in the last 8760 hours.    Other results:  Imaging: [place] EKG SITE RITE  Result Date: 10/27/2021 If Site Rite image not attached, placement could not be confirmed due to current cardiac rhythm.    Medications:     Scheduled Medications:  sodium chloride   Intravenous Once   acetaminophen (TYLENOL) oral liquid 160 mg/5 mL  650 mg Per Tube Q6H   ascorbic acid  250 mg Per Tube BID   aspirin  81 mg Per Tube Daily   Chlorhexidine Gluconate Cloth  6 each Topical Daily   clonazePAM  0.5 mg Per Tube Q8H   collagenase   Topical Daily   darbepoetin (ARANESP) injection - NON-DIALYSIS  100 mcg Subcutaneous Q Mon-1800   docusate  100 mg Per Tube BID   feeding supplement (PROSource TF20)  60 mL Per Tube TID   folic acid  1 mg Per Tube Daily   heparin injection (subcutaneous)  5,000 Units Subcutaneous Q8H   insulin aspart  0-20 Units Subcutaneous Q4H   insulin aspart  2 Units Subcutaneous Q4H   insulin glargine-yfgn  10 Units Subcutaneous QHS   midodrine  15 mg Per Tube TID WC   multivitamin  1 tablet Per Tube QHS   mouth rinse  15 mL  Mouth Rinse 4 times per day   oxyCODONE  20 mg Per Tube Q6H   pantoprazole  40 mg Per Tube BID   polyethylene glycol  17 g Per Tube Daily   QUEtiapine  50 mg Per Tube BID   sennosides  5 mL Per Tube QHS    sodium chloride flush  10-40 mL Intracatheter Q12H   sodium chloride flush  10-40 mL Intracatheter Q12H   sodium chloride flush  3 mL Intravenous Q12H   thiamine  100 mg Per Tube Daily   zinc sulfate  220 mg Per Tube Daily    Infusions:   prismasol BGK 4/2.5 500 mL/hr at 10/27/21 2127    prismasol BGK 4/2.5 300 mL/hr at 10/27/21 1559   sodium chloride Stopped (10/27/21 1703)   sodium chloride     albumin human Stopped (10/15/21 1901)   epinephrine 0.5 mcg/min (10/28/21 0700)   feeding supplement (VITAL 1.5 CAL) 60 mL/hr at 10/28/21 0700   meropenem (MERREM) IV Stopped (10/28/21 0657)   norepinephrine (LEVOPHED) Adult infusion 5 mcg/min (10/28/21 0700)   prismasol BGK 4/2.5 1,300 mL/hr at 10/27/21 2055   vasopressin 0.04 Units/min (10/28/21 0700)    PRN Medications: sodium chloride, albumin human, camphor-menthol, dextrose, diphenhydrAMINE, heparin, hydrALAZINE, HYDROmorphone (DILAUDID) injection, ipratropium-albuterol, midazolam, ondansetron (ZOFRAN) IV, mouth rinse, polyvinyl alcohol, sodium chloride flush, sodium chloride flush, sodium chloride flush   Assessment/Plan:    1. Shock - mixed cardiogenic/hemorrhagic -> VA ECMO -> decannulated on 8/28 to Impella 5.5 - Echo 08/28: Underfilled LV, EF 55-60% with severe LVH and near normal RV systsystolic function.  - Impella extracted 9/7 - Concern for worsening sepsis with leukocytosis and increasing pressor requirements on 09/13. Purulent drainage from Aline, replaced. - BC X 2 NGTD, tracheal aspirate pending. Procalcitonin 1.63.  - Given prolonged abx course earlier in stay, addition of Daptomycin, Micafungin and meropenem recommended per ID. CCM planning to narrow. Seems reasonable.  - On Epi 0.5, NE 5 + VP 0.03. Co-ox 63%. Wean as tolerated. On midodrine 15 tid to support.  - CVP  9 .Volume managed with CRRT. Aim to keep CVP 10-12 in setting of sepsis and RV failure - Bedside echo 09/13 - LV function preserved, RV mildly reduced.  Small clot in posterior pericardium but no hemodynamic effect  2. Cardiac arrest (PEA/bradycardic) - 8/19 in setting of tamponade - PEA arrest again on 08/28  3. Cardiac tamponade with emergent bedside sternotomy  - Diffuse epicardial bleeding with post-op Dresslers - return to OR 8/21 and 8/24 for washouts.  - Washout 8/28 in OR. Multiple units of blood products in OR.   - Chest closed on 9/2 - Hgb 8.1>7.6 today. No obvious source.   4. Acute hypoxemic respiratory failure - Off ECMO.  - Vent support per CCM - Perc Trach placed 9/7  - He has completed course of Vanc + meropenem.  - s/p bronchoscopy w/ BAL 9/4.  - tracheal aspirate 09/13 growing few GPC and rare yeast  - Back on IV abx as above d/t concern for sepsis. Management per CCM  5. AKI due to ATN - CRRT started 08/28.  - Remains on anuric and on CRRT, nephrology following  - Plan for transition to iHD soon   6. 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 Now off colchicine with CRRT.  7. 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 Continue aspirin 81 mg daily  8. DM2 - continue SSI   9. PAF/AFL - Tolerates poorly.  -  Recurrent AFL. S/p DCCV  to SR 08/29.  - Maintaining SR.  - Off amio d/t bradycardia - Not on AC for now d/t bleeding, DVT dose heparin.   10. ID - Completed vancomycin/meropenem with open chest.  - WBC 17.2  -On meropnenum.   11. Neuro -Follows commands off sedation  12. FEN - TFs ongoing. Now tolerating  13. Hyperkalemia - Resolved   14. Unstageable Pressure Ulcer, Buttock  WOC following.    Darrick Grinder, NP  7:34 AM  Agree with above.   Remains on trach collar and CVVHD. Keeping even. Weaning pressors.   Rhythm stable.  Follows commands. C/o sacral pain.   General:  Lying in bed No resp difficulty HEENT: normal + cor-track  Neck: supple. RIJ HD cath Carotids 2+ bilat; no bruits. No lymphadenopathy or thryomegaly appreciated. Cor: PMI nondisplaced.  Regular rate & rhythm. No rubs, gallops or murmurs. Lungs: clear Abdomen: soft, nontender, nondistended. No hepatosplenomegaly. No bruits or masses. Good bowel sounds. Extremities: no cyanosis, clubbing, rash, edema Neuro:awake following commands  Improving slowly. Continue to wean pressors. Agree with plan to switch to iHD. D/w Dr. Lynetta Mare will continue micafungin x 14 days.   CRITICAL CARE Performed by: Glori Bickers  Total critical care time: 35 minutes  Critical care time was exclusive of separately billable procedures and treating other patients.  Critical care was necessary to treat or prevent imminent or life-threatening deterioration.  Critical care was time spent personally by me (independent of midlevel providers or residents) on the following activities: development of treatment plan with patient and/or surrogate as well as nursing, discussions with consultants, evaluation of patient's response to treatment, examination of patient, obtaining history from patient or surrogate, ordering and performing treatments and interventions, ordering and review of laboratory studies, ordering and review of radiographic studies, pulse oximetry and re-evaluation of patient's condition.  Glori Bickers, MD  12:23 PM

## 2021-10-29 ENCOUNTER — Inpatient Hospital Stay (HOSPITAL_COMMUNITY): Payer: Self-pay

## 2021-10-29 LAB — RENAL FUNCTION PANEL
Albumin: 2.3 g/dL — ABNORMAL LOW (ref 3.5–5.0)
Albumin: 2.3 g/dL — ABNORMAL LOW (ref 3.5–5.0)
Anion gap: 6 (ref 5–15)
Anion gap: 10 (ref 5–15)
BUN: 21 mg/dL (ref 8–23)
BUN: 37 mg/dL — ABNORMAL HIGH (ref 8–23)
CO2: 26 mmol/L (ref 22–32)
CO2: 24 mmol/L (ref 22–32)
Calcium: 8.5 mg/dL — ABNORMAL LOW (ref 8.9–10.3)
Calcium: 8.6 mg/dL — ABNORMAL LOW (ref 8.9–10.3)
Chloride: 105 mmol/L (ref 98–111)
Chloride: 106 mmol/L (ref 98–111)
Creatinine, Ser: 1.7 mg/dL — ABNORMAL HIGH (ref 0.61–1.24)
Creatinine, Ser: 2.47 mg/dL — ABNORMAL HIGH (ref 0.61–1.24)
GFR, Estimated: 45 mL/min — ABNORMAL LOW (ref >60)
GFR, Estimated: 29 mL/min — ABNORMAL LOW (ref >60)
Glucose, Bld: 154 mg/dL — ABNORMAL HIGH (ref 70–99)
Glucose, Bld: 115 mg/dL — ABNORMAL HIGH (ref 70–99)
Phosphorus: 2.7 mg/dL (ref 2.5–4.6)
Phosphorus: 4.2 mg/dL (ref 2.5–4.6)
Potassium: 4.5 mmol/L (ref 3.5–5.1)
Potassium: 4.3 mmol/L (ref 3.5–5.1)
Sodium: 137 mmol/L (ref 135–145)
Sodium: 140 mmol/L (ref 135–145)

## 2021-10-29 LAB — CBC
HCT: 28.4 % — ABNORMAL LOW (ref 39.0–52.0)
Hemoglobin: 8.2 g/dL — ABNORMAL LOW (ref 13.0–17.0)
MCH: 29 pg (ref 26.0–34.0)
MCHC: 28.9 g/dL — ABNORMAL LOW (ref 30.0–36.0)
MCV: 100.4 fL — ABNORMAL HIGH (ref 80.0–100.0)
Platelets: 350 10*3/uL (ref 150–400)
RBC: 2.83 MIL/uL — ABNORMAL LOW (ref 4.22–5.81)
RDW: 20.9 % — ABNORMAL HIGH (ref 11.5–15.5)
WBC: 23.2 10*3/uL — ABNORMAL HIGH (ref 4.0–10.5)
nRBC: 2.5 % — ABNORMAL HIGH (ref 0.0–0.2)

## 2021-10-29 LAB — COOXEMETRY PANEL
Carboxyhemoglobin: 2.4 % — ABNORMAL HIGH (ref 0.5–1.5)
Methemoglobin: <0.7 % (ref 0.0–1.5)
O2 Saturation: 54.9 %
Total hemoglobin: 7.2 g/dL — ABNORMAL LOW (ref 12.0–16.0)

## 2021-10-29 LAB — GLUCOSE, CAPILLARY
Glucose-Capillary: 121 mg/dL — ABNORMAL HIGH (ref 70–99)
Glucose-Capillary: 139 mg/dL — ABNORMAL HIGH (ref 70–99)
Glucose-Capillary: 141 mg/dL — ABNORMAL HIGH (ref 70–99)
Glucose-Capillary: 144 mg/dL — ABNORMAL HIGH (ref 70–99)
Glucose-Capillary: 148 mg/dL — ABNORMAL HIGH (ref 70–99)
Glucose-Capillary: 187 mg/dL — ABNORMAL HIGH (ref 70–99)
Glucose-Capillary: 86 mg/dL (ref 70–99)

## 2021-10-29 LAB — MAGNESIUM: Magnesium: 2.5 mg/dL — ABNORMAL HIGH (ref 1.7–2.4)

## 2021-10-29 MED ORDER — SODIUM CHLORIDE 0.9 % IV SOLN
200.0000 mg | INTRAVENOUS | Status: DC
  Filled 2021-10-29: qty 10

## 2021-10-29 MED ORDER — SODIUM CHLORIDE 0.9 % IV SOLN
200.0000 mg | INTRAVENOUS | Status: DC
  Administered 2021-10-29: 200 mg via INTRAVENOUS
  Filled 2021-10-29 (×2): qty 10

## 2021-10-29 MED ORDER — QUETIAPINE FUMARATE 50 MG PO TABS
50.0000 mg | ORAL_TABLET | Freq: Every day | ORAL | Status: DC
  Administered 2021-10-29 – 2021-11-23 (×25): 50 mg
  Filled 2021-10-29 (×26): qty 1

## 2021-10-29 MED ORDER — CLONAZEPAM 0.5 MG PO TABS
0.5000 mg | ORAL_TABLET | Freq: Three times a day (TID) | ORAL | Status: DC | PRN
  Administered 2021-10-29 – 2021-11-20 (×25): 0.5 mg
  Filled 2021-10-29 (×25): qty 1

## 2021-10-29 MED ORDER — OXYCODONE HCL 5 MG PO TABS
5.0000 mg | ORAL_TABLET | Freq: Four times a day (QID) | ORAL | Status: DC
  Administered 2021-11-01 – 2021-11-03 (×8): 5 mg
  Filled 2021-10-29 (×8): qty 1

## 2021-10-29 MED ORDER — OXYCODONE HCL 5 MG PO TABS
10.0000 mg | ORAL_TABLET | Freq: Four times a day (QID) | ORAL | Status: AC
  Administered 2021-10-29 – 2021-11-01 (×10): 10 mg
  Filled 2021-10-29 (×12): qty 2

## 2021-10-29 NOTE — Progress Notes (Addendum)
Patient ID: [name], male   DOB: 1960/09/08, 61 y.o.   MRN: [id number]   Advanced Heart Failure Rounding Note   Subjective:    - 8/19 Pericardial window - 8/20 Cardiac arrest with tamponade -> Emergent bedside washout - 09/29/21 VA Cannulation - 09/30/21 Return to OR for mediastinal hemorrhage - 10/01/21 Developed AF -> amio - 10/02/21 TEE EF 25-30%  - 10/04/21 OR for washout. C/b continued bleeding overnight - 10/07/21 Placement of Impella 5.5 with washout, VA ECMO decannulation. Hypotensive with development of severe RV dysfunction after ECMO off and pressors titrated up. Multiple units of blood products. - 10/08/21 Brief PEA arrest. AFL with RVR >> S/p DCCV to SR, back in AFL shortly after - 8/31 Give 1UPRBCs  - 9/2 OR for chest closure - 9/3 Hypotensive overnight w/ SBPs in 80s. Febrile, mTemp 100.8. CRRT paused. VP increased to 0.04.  - 9/4 s/p bronchoscopy w/ BAL by PCCM, Cx NGTD  - 9/5 vomiting w/ large volume NGT output + watery/foul diarrhea, Tube feeds held. No signs of ileus on KUB. C-diff negative   - 9/7 OR for Impella Extraction and percutaneous tracheostomy. 2 u RBCs. - 9/8 CVVH stopped and line removed for holiday - 9/9 CVVH restarted.  -9/13 Worsening leukocytosis and pressor requirements. Purulent drainage from arterial line. Started Daptomycin, Micafungin and Meropenem. - 9/17 Daptomycin + Micafungin stopped. Remains on Meropenum   Remains on CVVHD. Running even. CVP 9   On NE 2   Complaining of abdominal pain. N/V overnight >400 cc out.   Echo 08/28: LV EF 55-60% with severe LVH, small ventricular cavity, RV appears to have near normal systolic function   Objective:     Vital Signs:   Temp:  [97.9 F (36.6 C)-98.6 F (37 C)] 98.2 F (36.8 C) (09/19 0352) Pulse Rate:  [71-95] 74 (09/19 0700) Resp:  [11-34] 20 (09/19 0700) BP: (85-136)/(44-93) 107/61 (09/19 0700) SpO2:  [93 %-100 %] 100 % (09/19 0700) FiO2 (%):  [28 %] 28 % (09/19 0352) Weight:  [89.9  kg] 89.9 kg (09/19 0500) Last BM Date : 10/28/21  Weight change: Filed Weights   10/27/21 0500 10/28/21 0500 10/29/21 0500  Weight: 90.3 kg 89.3 kg 89.9 kg    Intake/Output:   Intake/Output Summary (Last 24 hours) at 10/29/2021 0737 Last data filed at 10/29/2021 0700 Gross per 24 hour  Intake 1818.45 ml  Output 2828 ml  Net -1009.55 ml  CVP 9 personally checked.  Exam: General: Deno Lunger Freddy Jaksch HEENT: RIJ + NGT Neck: Trach.  JVD difficult to assess. . Carotids 2+ bilat; no bruits. No lymphadenopathy or thryomegaly appreciated. Cor: PMI nondisplaced. Regular rate & rhythm. No rubs, gallops or murmurs. Lungs: Course throughout. Abdomen: soft, tender, nondistended. No hepatosplenomegaly. No bruits or masses. Good bowel sounds. + flexi seal  Extremities: no cyanosis, clubbing, rash, edema. LUE PICC  Neuro: alert . MAE x4.  cranial nerves grossly intact.   Telemetry: SR 70-80s personally checked.   Labs: Basic Metabolic Panel: Recent Labs  Lab 10/25/21 0339 10/25/21 1450 10/26/21 0429 10/26/21 1550 10/27/21 0428 10/27/21 1717 10/28/21 0415 10/28/21 1556 10/29/21 0441  NA 139   < > 136   < > 138 139 138 138 137  K 4.1   < > 3.9   < > 4.3 4.1 4.0 4.3 4.5  CL 105   < > 102   < > 103 104 103 105 105  CO2 23   < > 25   < >  _0 GLUCOSE 165*   < > 250*   < > 144* 139* 192* 127* 154*  BUN 15   < > 15   < > _1 CREATININE 0.98   < > 1.09   < > 1.20 1.49* 1.65* 1.71* 1.70*  CALCIUM 8.1*   < > 8.2*   < > 8.6* 8.7* 8.2* 8.3* 8.5*  MG 2.3  --  2.5*  --  2.7*  --  2.5*  --  2.5*  PHOS 4.2   < > 1.7*   < > 2.0* 3.9 2.5 2.4* 2.7   < > = values in this interval not displayed.    Liver Function Tests: Recent Labs  Lab 10/27/21 0428 10/27/21 1717 10/28/21 0415 10/28/21 1556 10/29/21 0441  ALBUMIN 2.4* 2.3* 2.3* 2.3* 2.3*   No results for input(s): "LIPASE", "AMYLASE" in the last 168 hours. No results for input(s): "AMMONIA" in the last 168  hours.  CBC: Recent Labs  Lab 10/25/21 0339 10/25/21 1500 10/26/21 0429 10/27/21 0428 10/28/21 0415 10/28/21 1556 10/29/21 0441  WBC 19.1*  --  18.7* 22.9* 17.2*  --  23.2*  HGB 7.6*   < > 7.4* 8.1* 7.6* 8.4* 8.2*  HCT 24.9*   < > 25.1* 26.5* 25.8* 28.6* 28.4*  MCV 94.7  --  98.0 96.0 98.1  --  100.4*  PLT 392  --  387 451* 345  --  350   < > = values in this interval not displayed.    Cardiac Enzymes: Recent Labs  Lab 10/23/21 1432  CKTOTAL 407*    BNP: BNP (last 3 results) Recent Labs    12/24/20 0651 09/28/21 0405  BNP 412.0* 826.6*    ProBNP (last 3 results) No results for input(s): "PROBNP" in the last 8760 hours.    Other results:  Imaging: [place] EKG SITE RITE  Result Date: 10/27/2021 If Site Rite image not attached, placement could not be confirmed due to current cardiac rhythm.    Medications:     Scheduled Medications:  sodium chloride   Intravenous Once   acetaminophen (TYLENOL) oral liquid 160 mg/5 mL  650 mg Per Tube Q6H   ascorbic acid  250 mg Per Tube BID   aspirin  81 mg Per Tube Daily   Chlorhexidine Gluconate Cloth  6 each Topical Daily   clonazePAM  0.5 mg Per Tube Q8H   collagenase   Topical Daily   darbepoetin (ARANESP) injection - NON-DIALYSIS  100 mcg Subcutaneous Q Mon-1800   feeding supplement (PROSource TF20)  60 mL Per Tube TID   fiber  1 packet Per Tube BID   folic acid  1 mg Per Tube Daily   heparin injection (subcutaneous)  5,000 Units Subcutaneous Q8H   insulin aspart  0-20 Units Subcutaneous Q4H   insulin aspart  2 Units Subcutaneous Q4H   insulin glargine-yfgn  10 Units Subcutaneous QHS   midodrine  15 mg Per Tube TID WC   multivitamin  1 tablet Per Tube QHS   nutrition supplement (JUVEN)  1 packet Per Tube BID BM   mouth rinse  15 mL Mouth Rinse 4 times per day   oxyCODONE  20 mg Per Tube Q6H   pantoprazole  40 mg Per Tube BID   QUEtiapine  50 mg Per Tube BID   sodium chloride flush  10-40 mL Intracatheter Q12H    sodium chloride flush  10-40 mL Intracatheter Q12H  sodium chloride flush  3 mL Intravenous Q12H   thiamine  100 mg Per Tube Daily   zinc sulfate  220 mg Per Tube Daily    Infusions:   prismasol BGK 4/2.5 500 mL/hr at 10/29/21 0429    prismasol BGK 4/2.5 300 mL/hr at 10/29/21 0508   sodium chloride 10 mL/hr at 10/29/21 0500   sodium chloride     albumin human Stopped (10/15/21 1901)   epinephrine Stopped (10/28/21 0758)   feeding supplement (VITAL 1.5 CAL) 60 mL/hr at 10/29/21 0500   meropenem (MERREM) IV 1 g (10/29/21 0503)   norepinephrine (LEVOPHED) Adult infusion 2 mcg/min (10/29/21 0500)   prismasol BGK 4/2.5 1,300 mL/hr at 10/29/21 0426   vasopressin Stopped (10/28/21 0833)    PRN Medications: sodium chloride, albumin human, camphor-menthol, dextrose, diphenhydrAMINE, heparin, hydrALAZINE, HYDROmorphone (DILAUDID) injection, ipratropium-albuterol, midazolam, ondansetron (ZOFRAN) IV, mouth rinse, polyethylene glycol, polyvinyl alcohol, sodium chloride flush, sodium chloride flush, sodium chloride flush, white petrolatum   Assessment/Plan:    1. Shock - mixed cardiogenic/hemorrhagic -> VA ECMO -> decannulated on 8/28 to Impella 5.5 - Echo 08/28: Underfilled LV, EF 55-60% with severe LVH and near normal RV systsystolic function.  - Impella extracted 9/7 - Concern for worsening sepsis with leukocytosis and increasing pressor requirements on 09/13. Purulent drainage from [place], replaced. - BC X 2 NGTD, tracheal aspirate pending. Procalcitonin 1.63.  - Given prolonged abx course earlier in stay, addition of Daptomycin, Micafungin and meropenem recommended per ID.  - On NE 2. Co-ox 55%. On midodrine 15 tid to support.  - CVP  9  .Volume managed with CRRT. Aim to keep CVP 10-12 in setting of sepsis and RV failure. - Bedside echo 09/13 - LV function preserved, RV mildly reduced. Small clot in posterior pericardium but no hemodynamic effect  2. Cardiac arrest (PEA/bradycardic) -  8/19 in setting of tamponade - PEA arrest again on 08/28  3. Cardiac tamponade with emergent bedside sternotomy  - Diffuse epicardial bleeding with post-op Dresslers - return to OR 8/21 and 8/24 for washouts.  - Washout 8/28 in OR. Multiple units of blood products in OR.   - Chest closed on 9/2 - Hgb 8.1>7.6>8.2 today. No obvious source.   4. Acute hypoxemic respiratory failure - Off ECMO.  -  Perc Trach placed 9/7  - s/p bronchoscopy w/ BAL 9/4.  - tracheal aspirate 09/13 growing few GPC and rare yeast  - Back on IV abx as above d/t concern for sepsis. Management per CCM  5. AKI due to ATN - CRRT started 08/28.  - Remains on anuric and on CRRT, nephrology following  - Plan for transition to iHD soon   6. 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 Now off colchicine with CRRT.  7. 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 Continue aspirin 81 mg daily  8. DM2 - continue SSI   9. PAF/AFL - Tolerates poorly.  - Recurrent AFL. S/p DCCV  to SR 08/29.  - Off amio d/t bradycardia - Not on [facility] for now d/t bleeding, DVT dose heparin.  - Maintaining SR  10. ID - Completed vancomycin/meropenem with open chest.  - WBC 17.2 >23. ? Possible aspiration with vomiting.  -On meropnenum.   11. Neuro -Follows commands.   12. FEN - TFs ongoing . N/V   13. Hyperkalemia - Resolved   14. Unstageable Pressure Ulcer, Buttock  WOC following.   15. Abdominal Pain -N/V earlier today with >400 cc out.  -Discussed  with CCM/CT surgery.    Darrick Grinder, NP  7:37 AM  Agree with above.   Remains. On low-dose NE. Now on Trach collar. Awake following commands. CVVHD on hold. CVP 9.   Having high gastric residuals. KUB ok. Cor-trak a bit high.   WBC climbing a bit. Wounds reviewed with RN and improving.   General:  In bed. On trach collar No resp difficulty HEENT: normal Neck: supple. RIJ HD cath Carotids 2+ bilat; no bruits. No lymphadenopathy or thryomegaly  appreciated. Cor: PMI nondisplaced. Sternal dressing ok Lungs: clear Abdomen: soft, nontender, nondistended. No hepatosplenomegaly. No bruits or masses. Good bowel sounds. Extremities: no cyanosis, clubbing, rash, edema Neuro: alert follows commands  Continues to make slow progress. Continue to wean NE as tolerated. Hopefully can switch to iHD. Still with functional ileus but KUB ok. Will advance Cor-trak. Can restart Reglan as needed.   WBC up a bit. Continue current abx/anti-fungal regimen per CCM.   Mobilize. Continue wound care.   CRITICAL CARE Performed by: Glori Bickers  Total critical care time: 35 minutes  Critical care time was exclusive of separately billable procedures and treating other patients.  Critical care was necessary to treat or prevent imminent or life-threatening deterioration.  Critical care was time spent personally by me (independent of midlevel providers or residents) on the following activities: development of treatment plan with patient and/or surrogate as well as nursing, discussions with consultants, evaluation of patient's response to treatment, examination of patient, obtaining history from patient or surrogate, ordering and performing treatments and interventions, ordering and review of laboratory studies, ordering and review of radiographic studies, pulse oximetry and re-evaluation of patient's condition.  Glori Bickers, MD  12:05 PM

## 2021-10-29 NOTE — Plan of Care (Signed)
  Problem: Respiratory: Goal: Respiratory status will improve Outcome: Progressing   Problem: Cardiac: Goal: Will achieve and/or maintain hemodynamic stability Outcome: Progressing   Problem: Urinary Elimination: Goal: Ability to achieve and maintain adequate renal perfusion and functioning will improve Outcome: Not Progressing   Problem: Activity: Goal: Risk for activity intolerance will decrease Outcome: Progressing

## 2021-10-29 NOTE — Procedures (Addendum)
Cortrak  Tube Type:  Cortrak - 43 inches Tube Location:  Left nare Initial Placement:  Postpyloric Secured by: Bridle Technique Used to Measure Tube Placement:  Marking at nare/corner of mouth Cortrak Secured At:  112 cm   Cortrak Tube Team Note:  Consult received to advance Cortrak feeding tube.   X-ray is required, abdominal x-ray has been ordered by the Cortrak team. Please confirm tube placement before using the Cortrak tube.   If the tube becomes dislodged please keep the tube and contact the Cortrak team at [email or web address] (password [email or web address]) for replacement.  If after hours and replacement cannot be delayed, place a NG tube and confirm placement with an abdominal x-ray.    Koleen Distance MS, RD, LDN Please refer to [facility] for RD and/or RD on-call/weekend/after hours pager

## 2021-10-29 NOTE — Progress Notes (Signed)
Subjective:  req NGT overnight-  otherwise remains critically ill but fairly stable-  CRRT running well, was negative one liter overnight -  still on pressors but at lower dose-   CVP 10-  nurse tells me that CRRT is about to clot  Objective Vital signs in last 24 hours: Vitals:   10/29/21 0615 10/29/21 0630 10/29/21 0645 10/29/21 0700  BP: (!) 117/58 94/72 110/60 107/61  Pulse: 75 77 76 74  Resp: 18 16 18 20   Temp:      TempSrc:      SpO2: 100% 93% 100% 100%  Weight:      Height:       Weight change: 0.6 kg  Intake/Output Summary (Last 24 hours) at 10/29/2021 0744 Last data filed at 10/29/2021 0700 Gross per 24 hour  Intake 1818.45 ml  Output 2828 ml  Net -1009.55 ml   Assessment/Plan:    Pt is a 61 y.o. yo male  with HTN, HLD, DM, A-fib, stroke, CKD, CAD status post CABG on 8/3 presented with SOB, found cardiac tamponade, course complicated by cardiac arrest, use of ECMO, seen as a consultation for the evaluation of AKI and fluid volume management.   #Acute kidney injury on CKD IIIa, nonoliguric: Ischemic ATN due to cardiogenic shock/cardiac arrest.  Started CRRT on 8/28 for decreasing urine output and fluid/volume management.  Brief (less than 24 hours) line free interval after clotting and CRRT resumed  on 9/9 after nontunneled line with critical care - since  CRRT going to clot this AM-  labs good, volume status good-  will hold CRRT for the rest of day -  assume will need to resume but possibly could be off of pressors and we could transition to IHD ?  - on 4K Fluids;  -Replace phos PRN - team would prefer citrate if agent needed for clotting -  No anticoagulation currently.  If the patient starts to clot more frequently will consider starting citrate - transition to iHD as able. Vasopressor support persistent Appreciate IR input-  following for an opportunity to place [facility]    #Cardiac tamponade/hemorrhagic pericarditis status post pericardial drain placement with subsequent  ECMO placement.  decannulated  with placement of Impella.  S/p closure of the sternum and mediastinal washout on 9/2.   #Cardiogenic shock/cardiac arrest: Was on ECMO. Continue pressors per primary team.  Impella out on 9/7    #Acute hypoxic respiratory failure:  improving - now on TC.  optimizing volume with CRRT -  running even at present    #Acute blood loss anemia: Monitor hemoglobin and transfuse as needed by ICU team.  Aranesp 40 mcg weekly on Mondays started on 9/4.  Increased dose to 100 mcg weekly      #Hypophosphatemia due to CRRT, replete sodium phosphate as needed     Louis Meckel    Labs: Basic Metabolic Panel: Recent Labs  Lab 10/28/21 0415 10/28/21 1556 10/29/21 0441  NA 138 138 137  K 4.0 4.3 4.5  CL 103 105 105  CO2 24 25 26   GLUCOSE 192* 127* 154*  BUN 21 20 21   CREATININE 1.65* 1.71* 1.70*  CALCIUM 8.2* 8.3* 8.5*  PHOS 2.5 2.4* 2.7   Liver Function Tests: Recent Labs  Lab 10/28/21 0415 10/28/21 1556 10/29/21 0441  ALBUMIN 2.3* 2.3* 2.3*   No results for input(s): "LIPASE", "AMYLASE" in the last 168 hours. No results for input(s): "AMMONIA" in the last 168 hours. CBC: Recent Labs  Lab 10/25/21 0339 10/25/21  1500 10/26/21 0429 10/27/21 0428 10/28/21 0415 10/28/21 1556 10/29/21 0441  WBC 19.1*  --  18.7* 22.9* 17.2*  --  23.2*  HGB 7.6*   < > 7.4* 8.1* 7.6* 8.4* 8.2*  HCT 24.9*   < > 25.1* 26.5* 25.8* 28.6* 28.4*  MCV 94.7  --  98.0 96.0 98.1  --  100.4*  PLT 392  --  387 451* 345  --  350   < > = values in this interval not displayed.   Cardiac Enzymes: Recent Labs  Lab 10/23/21 1432  CKTOTAL 407*   CBG: Recent Labs  Lab 10/28/21 1128 10/28/21 1605 10/28/21 2036 10/29/21 0043 10/29/21 0455  GLUCAP 159* 133* 149* 121* 148*    Iron Studies: No results for input(s): "IRON", "TIBC", "TRANSFERRIN", "FERRITIN" in the last 72 hours. Studies/Results: [place] EKG SITE RITE  Result Date: 10/27/2021 If Site Rite image not  attached, placement could not be confirmed due to current cardiac rhythm.  Medications: Infusions:   prismasol BGK 4/2.5 500 mL/hr at 10/29/21 0429    prismasol BGK 4/2.5 300 mL/hr at 10/29/21 7622   sodium chloride 10 mL/hr at 10/29/21 0500   sodium chloride     albumin human Stopped (10/15/21 1901)   epinephrine Stopped (10/28/21 0758)   feeding supplement (VITAL 1.5 CAL) 60 mL/hr at 10/29/21 0500   meropenem (MERREM) IV 1 g (10/29/21 0503)   norepinephrine (LEVOPHED) Adult infusion 2 mcg/min (10/29/21 0500)   prismasol BGK 4/2.5 1,300 mL/hr at 10/29/21 0426   vasopressin Stopped (10/28/21 6333)    Scheduled Medications:  sodium chloride   Intravenous Once   acetaminophen (TYLENOL) oral liquid 160 mg/5 mL  650 mg Per Tube Q6H   ascorbic acid  250 mg Per Tube BID   aspirin  81 mg Per Tube Daily   Chlorhexidine Gluconate Cloth  6 each Topical Daily   clonazePAM  0.5 mg Per Tube Q8H   collagenase   Topical Daily   darbepoetin (ARANESP) injection - NON-DIALYSIS  100 mcg Subcutaneous Q Mon-1800   feeding supplement (PROSource TF20)  60 mL Per Tube TID   fiber  1 packet Per Tube BID   folic acid  1 mg Per Tube Daily   heparin injection (subcutaneous)  5,000 Units Subcutaneous Q8H   insulin aspart  0-20 Units Subcutaneous Q4H   insulin aspart  2 Units Subcutaneous Q4H   insulin glargine-yfgn  10 Units Subcutaneous QHS   midodrine  15 mg Per Tube TID WC   multivitamin  1 tablet Per Tube QHS   nutrition supplement (JUVEN)  1 packet Per Tube BID BM   mouth rinse  15 mL Mouth Rinse 4 times per day   oxyCODONE  20 mg Per Tube Q6H   pantoprazole  40 mg Per Tube BID   QUEtiapine  50 mg Per Tube BID   sodium chloride flush  10-40 mL Intracatheter Q12H   sodium chloride flush  10-40 mL Intracatheter Q12H   sodium chloride flush  3 mL Intravenous Q12H   thiamine  100 mg Per Tube Daily   zinc sulfate  220 mg Per Tube Daily    have reviewed scheduled and prn medications.  Physical  Exam: General: more awake on vent this AM Heart: RRR Lungs: mostly clear Abdomen: slightly distended Extremities: min edema Dialysis Access: right IJ dialysis cath-  63 days old     10/29/2021,7:44 AM  LOS: 31 days

## 2021-10-29 NOTE — Progress Notes (Signed)
[facility] Progress Note Patient Name: [name] DOB: Nov 27, 1960 MRN: [id number]   Date of Service  10/29/2021  HPI/Events of Note  Distended abdomen with vomiting  eICU Interventions  NG to decompress     Intervention Category Intermediate Interventions: Abdominal pain - evaluation and management  Mauri Brooklyn, P 10/29/2021, 2:42 AM

## 2021-10-29 NOTE — Progress Notes (Signed)
Patient ID: MILLS MITTON, male   DOB: 1960-05-17, 61 y.o.   MRN: [id number]    Progress Note from the Palliative Medicine Team at [facility]   Patient Name: KEIGHAN AMEZCUA        Date: 10/29/2021 DOB: 06-29-1960  Age: 61 y.o. MRN#: [id number] Attending Physician: [name], MD Primary Care Physician: [name], MD Admit Date: 09/28/2021   Medical records reviewed, discussed with treatment team, assessed patient at bedside  61 y.o. 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 Bradycardic and hypoglycemic.   ECHO obtained which showed LVEF 50%, grade II diastolic dysfunction and moderate pericardial effusion with possible tamponade physiology.    CT Chest showed pericardial effusion and small bilateral pleural effusions. He was taken to the OR 8/19 for pericardial window.   Complicated hospitalization, today is day [facility] Events 8/19 admitted, s/p pericardial window 8/20 PEA cardiac arrest, left pigtail chest tube placement, PEA cardiac arrest due to tamponade, bedside sternotomy with pericardial drains placed. 8/21 return to OR for overnight bleeding. ECMO 8/22 minimal chest tube output.  Atrial fibrillation, controled with amiodarone.  8/22 TEE showed EF 35% at baseline, which improved significantly with decreasing ECMO flow.  8/23 tolerated diuresis  8/24 mediastinal washout, removal of hematoma 8/25 ongoing bleeding issues from posterior mediastinum 8/29 decannulated and off ECMO, 5.5 Impella placed, L femoral HD cath placed and started on CRRT 8/30 hypoxia and hypotension followed by brief arrest, gentle L lateral chest compressions performed and ROSC in roughly 1 minute. 8/31 remains deeply sedated on ventilator with open chest.  No acute events overnight 9/1 no acute events overnight, remains on ventilator deep sedation.   Tolerating aggressive volume removal per CRRT 9/2 sternotomy closure 9/4 FOB, low grade temps, notable thick mucus/frequent suctioning  - 9/3 Hypotensive overnight w/ SBPs in 80s. Febrile, mTemp 100.8. CRRT paused. VP increased to 0.04.  - 9/4 s/p bronchoscopy w/ BAL by PCCM, Cx NGTD  - 9/5 vomiting w/ large volume NGT output + watery/foul diarrhea, Tube feeds held. No signs of ileus on KUB. C-diff negative   - 9/7 OR for Impella Extraction and percutaneous tracheostomy. 2 u RBCs. - 9/8 CVVH stopped and line removed for holiday - 9/9 CVVH restarted.  -9/13 Worsening leukocytosis and pressor requirements. Purulent drainage from arterial line. Started Daptomycin, Micafungin and Meropenem. - 9/17 Daptomycin + Micafungin stopped. Remains on Meropenum  -9/19 CRRT holiday.  9-19 patient is demonstrating steps and progression, follows  commands, working with therapies, no urine output and worsening BUN and creatinine   Initial palliative medicine consult completed on 09/30/2021.  Established decision makers at that time.  Colette Deshmukh patient's,  legal wife is main contact and decision maker in the event patient does not have decision-making capacity for himself.  Patient and his wife are separated.    Patient's friend [name] and [name] are main support persons   Patient remains critically ill, high risk for decompensation.    I spoke to [facility] [place]/legal spouse, I updated her on current medical situation.  We discussed best case scenario versus worst-case scenario and patient's long term anticipated care needs  She remain hopeful for ongoing treatment, stabilization and improvement.   I also spoke to [place] by phone, he continues to be a support for his friend Christop.  Currently he is working in  investigating financial situation, process and securing DURABLE POWER OF ATTORNEY and assistance with paying the patient's bills and financial responsibilities.  Education offered today regarding  the  importance of continued conversation with the  medical providers regarding overall plan of care and treatment options,  ensuring decisions are within the context of the patients values and GOCs.  Patient will need much support as he and his support persons navigate current medical situation and treatment plan into the future.  PMT will continue to support holistically   [name]  Palliative Medicine Team Team Phone # [phone] Pager [phone]

## 2021-10-29 NOTE — Progress Notes (Signed)
12 Days Post-Op Procedure(s) (LRB): REMOVAL OF IMPELLA LEFT VENTRICULAR ASSIST DEVICE (Right) TRANSESOPHAGEAL ECHOCARDIOGRAM (TEE) (N/A) PERCUTANEOUS TRACHEOSTOMY USING SHILEY FLEXIBLE 8 mm CUFFED TRACH. (N/A) Subjective:  No specific complaints.  Had some vomiting and abdominal distension overnight and NG replaced, draining bilious fluid mixed with tube feeds. KUB this am showed the feeding tube tip at junction of distal stomach and duodenum with coiled up tube in stomach. It was repositioned this afternoon and followup KUB shows tip in jejunum.  Objective: Vital signs in last 24 hours: Temp:  [97.9 F (36.6 C)-98.2 F (36.8 C)] 98.2 F (36.8 C) (09/19 0352) Pulse Rate:  [71-90] 83 (09/19 1514) Cardiac Rhythm: Normal sinus rhythm (09/19 1200) Resp:  [11-30] 20 (09/19 1514) BP: (85-145)/(44-100) 126/70 (09/19 1330) SpO2:  [93 %-100 %] 100 % (09/19 1514) FiO2 (%):  [28 %] 28 % (09/19 1514) Weight:  [89.9 kg] 89.9 kg (09/19 0500)  Hemodynamic parameters for last 24 hours: CVP:  [6 mmHg-40 mmHg] 8 mmHg  Intake/Output from previous day: 09/18 0701 - 09/19 0700 In: 1818.5 [I.V.:268.7; NG/GT:1350; IV Piggyback:199.8] Out: 2828 [Emesis/NG output:300; Stool:935] Intake/Output this shift: Total I/O In: 748.5 [I.V.:58.5; NG/GT:480; IV Piggyback:210] Out: 1163 [Emesis/NG output:500; Stool:600]  General appearance: alert and cooperative Neurologic: intact Heart: regular rate and rhythm, S1, S2 normal, no murmur Lungs: clear to auscultation bilaterally Abdomen: soft, non-tender; bowel sounds normal Extremities: minimal edema Wound: chest incision looks ok. Some skin edge necrosisbut stable. No sign of infection.  Lab Results: Recent Labs    10/28/21 0415 10/28/21 1556 10/29/21 0441  WBC 17.2*  --  23.2*  HGB 7.6* 8.4* 8.2*  HCT 25.8* 28.6* 28.4*  PLT 345  --  350   BMET:  Recent Labs    10/28/21 1556 10/29/21 0441  NA 138 137  K 4.3 4.5  CL 105 105  CO2 25 26   GLUCOSE 127* 154*  BUN 20 21  CREATININE 1.71* 1.70*  CALCIUM 8.3* 8.5*    PT/INR: No results for input(s): "LABPROT", "INR" in the last 72 hours. ABG    Component Value Date/Time   PHART 7.301 (L) 10/22/2021 2354   HCO3 19.3 (L) 10/22/2021 2354   TCO2 20 (L) 10/22/2021 2354   ACIDBASEDEF 7.0 (H) 10/22/2021 2354   O2SAT 54.9 10/29/2021 0445   CBG (last 3)  Recent Labs    10/29/21 0854 10/29/21 1137 10/29/21 1534  GLUCAP 144* 141* 86   CXR: LLL aeration improved. Overall looks good. No pleural effusions.  Assessment/Plan: S/P Procedure(s) (LRB): REMOVAL OF IMPELLA LEFT VENTRICULAR ASSIST DEVICE (Right) TRANSESOPHAGEAL ECHOCARDIOGRAM (TEE) (N/A) PERCUTANEOUS TRACHEOSTOMY USING SHILEY FLEXIBLE 8 mm CUFFED TRACH. (N/A)  Hemodynamically stable on NE 2 mcg and midodrine.  Stable on trach collar.   Mental status normal.  CRRT stopped this am since filter was clotting and will be reevaluated for resuming vs IHD.   Feeding tube advanced and that should improve gastric distension and tube feed tolerance. Bowels working.    LOS: 31 days    [name] 10/29/2021

## 2021-10-29 NOTE — Progress Notes (Signed)
     [address]       [place] [address]             [phone]       EVENING ROUNDS No new issues, just on levo at 2 Tolerating CRRT No changes in management

## 2021-10-29 NOTE — Progress Notes (Signed)
NAME:  [name], MRN:  [id number], DOB:  04-04-1960, LOS: 20 ADMISSION DATE:  09/28/2021, CONSULTATION DATE:  09/29/2021 REFERRING MD:  Kipp Brood - TCTS CHIEF COMPLAINT: Dyspnea   History of Present Illness:  61 year old man who underwent CABG on 8/3, readmitted on 8/19 in setting of dyspnea with a pericardial effusion requiring pericardial window via bedside sternotomy. Also with bilateral pleural effusions. Sustained PEA arrest on 8/20, briefly required [facility] ECMO which was discontinued on 8/28, Impella placed 8/28, started on CRRT.  Hypoxemia, repeat cardiac arrest on 8/30. Underwent mediastinal washout/sternotomy closure 9/2. Impella removed and tracheostomy completed 9/7.  Pertinent Medical History:  CAD s/p CABG 8/3 DM2 Diverticulosis GERD Hyperlipidemia Hypertension History of stroke Tobacco Abuse  Significant Hospital Events: Including procedures, antibiotic start and stop dates in addition to other pertinent events   8/19 Admitted, s/p pericardial window 8/20 PEA cardiac arrest, left pigtail chest tube placement, PEA cardiac arrest due to tamponade, bedside sternotomy with pericardial drains placed. 8/21 Return to OR for overnight bleeding.  8/22 Minimal chest tube output.  Atrial fibrillation, controled with amiodarone.  8/22 TEE showed EF 35% at baseline, which improved significantly with decreasing ECMO flow.  8/23 Tolerated diuresis  8/24 Mediastinal washout, removal of hematoma 8/25 Ongoing bleeding issues from posterior mediastinum 8/28 Decannulated and off ECMO, 5.5 Impella placed, L femoral HD cath placed and started on CRRT 8/30 Hypoxia and hypotension followed by brief arrest, gentle L lateral chest compressions performed and ROSC in roughly 1 minute. 8/31 Remains deeply sedated on ventilator with open chest.  No acute events overnight. 9/1 No acute events overnight, remains on ventilator deep sedation.  Tolerating aggressive volume removal per CRRT 9/2 Sternotomy  closure 9/3 Wean versed 9/4 FOB, low grade temps, notable thick mucus/frequent suctioning 9/5 Vomiting, ?mucus plugging. PSV 5-6 hours on 10/5 9/6 Impella down to p3 9/7 Impella removal and trach. PRBC and DDAVP in OR  9/8 Low dose epi, weaned on 8/8 9/9 RIJ HD catheter placed 9/11 Started on ATC at 28% and tolerating well 9/12 Remains on 28% ATC 9/13 SB/Junction rhythm with associated hypotension overnight requiring transient pressor escalation. Pressor requirement down in AM. WBC count 23 (18), Co-ox 36%. Resp/Blood Cx. 9/14 Mild bradycardia/relative hypotension with Precedex. Remains on Epi 23mcg, Vaso 0.03. Resp Cx with few gram+ cocci (clusters), yeast. BCx NGTD. 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 Co-ox 48%. CRRT stopped. 9/15 WBC continues to downtrend. Hgb 7.6. Net +1.49L/24H. CRRT resumed. Co-ox 51%. NE started to help with Epi wean. 9/16, Co. oximetry improved, WBC trending down, weaning pressors 9/19 on trach collar for 96 hours.  Weaning off vasopressors.  Trial off CRRT today.  Episode of overnight emesis.  Interim History / Subjective:   Vomited overnight.  Gastric contents suctioned.  Objective:  Blood pressure 107/64, pulse 85, temperature 98.2 F (36.8 C), temperature source Oral, resp. rate (!) 30, height 5\' 7"  (1.702 m), weight 89.9 kg, SpO2 100 %. CVP:  [8 mmHg-40 mmHg] 9 mmHg  FiO2 (%):  [28 %] 28 %   Intake/Output Summary (Last 24 hours) at 10/29/2021 1324 Last data filed at 10/29/2021 1000 Gross per 24 hour  Intake 1784.63 ml  Output 2443 ml  Net -658.37 ml    Filed Weights   10/27/21 0500 10/28/21 0500 10/29/21 0500  Weight: 90.3 kg 89.3 kg 89.9 kg   Physical Examination: General: Acutely ill-appearing middle-aged man in NAD. Appears uncomfortable. HEENT: [place]/AT, anicteric sclera, PERRL, moist mucous membranes. Lurline Idol  in place. Neuro:  Awake, unable to assess orientation but nodding appropriately/mouthing words to  questions.  Responds to verbal stimuli. Following commands intermittently. Moves all 4 extremities spontaneously. CV: RRR, no m/g/r. PULM: Mildly tachypneic, breathing unlabored on ATC (5L, FiO2 28%). Lung fields with bilateral coarse rhonchi, diminished at bilateral bases. GI: Soft, nontender, nondistended. Hypoactive bowel sounds. Extremities: Bilateral symmetric 1+ LE edema noted. Skin: Warm/dry, no rashes.  Resolved Hospital Problem List:   Acute RV failure > resolved  Assessment & Plan:   Acute metabolic encephalopathy -Continue to wean all sedatives off: Oxycodone taper in place, Seroquel nightly only, Klonopin as needed only  Hemorrhagic pericarditis with tamponade PEA arrest due to tamponade, s/p emergent sternotomy Cardiogenic & hemorrhagic shock, s/p VA ECMO (decannulated 8/29), s/p Impella (removed 9/7) -Continue to wean norepinephrine to off.  CAD s/p CABG x3 Afib  -Presently rate controlled  Leukocytosis with severe sepsis Pleated course of daptomycin, completed course of meropenem today. -Finish off 14-day course of micafungin as patient at high risk for fungemia.  Acute respiratory failure with hypoxemia and hypercarbia S/p Tracheostomy  -Continue trach collar trial. -Passy-Muir valve.  AKI with anuria  Hypophosphatemia -Transition to IHD. -After discussion with TTTS nephrology, okay for PICC placement, ordered 10/27/2021 -Permcath placement.   Vomiting Diarrhea, improving - TF via Cotrak - Hold bowel regimen as clinically appropriate -Continue to wean narcotics.  DM II with hyperglycemia -Presently well controlled  At Risk Malnutrition  - TF via Cortrak  Sacral pressure wound - WOC following  Severe physical deconditioning -Continue physical therapy.  Best Practice (right click and "Reselect all SmartList Selections" daily)  Diet/type: tubefeeds  DVT prophylaxis: prophylactic heparin  GI prophylaxis: PPI Lines: Central line, Dialysis  Catheter, and Arterial Line Foley:  N/A Code Status:  full code Last date of multidisciplinary goals of care discussion             CRITICAL CARE Performed by: Kipp Brood   Total critical care time: 35 minutes  Critical care time was exclusive of separately billable procedures and treating other patients.  Critical care was necessary to treat or prevent imminent or life-threatening deterioration.  Critical care was time spent personally by me on the following activities: development of treatment plan with patient and/or surrogate as well as nursing, discussions with consultants, evaluation of patient's response to treatment, examination of patient, obtaining history from patient or surrogate, ordering and performing treatments and interventions, ordering and review of laboratory studies, ordering and review of radiographic studies, pulse oximetry and re-evaluation of patient's condition.   Kipp Brood, MD [facility] 10/29/21 1:24 PM  Please see [email or web address] for contact info details.  From 7A-7P if no response, please call [phone] After hours, please call ELink [phone]

## 2021-10-29 NOTE — Progress Notes (Signed)
Speech Language Pathology Treatment: [name] Speaking valve  Patient Details Name: CHER EGNOR MRN: [id number] DOB: 02/24/1960 Today's Date: 10/29/2021 Time: 1779-3903 SLP Time Calculation (min) (ACUTE ONLY): 20 min  Assessment / Plan / Recommendation Clinical Impression  Pt was seen for PMV tx, more alert and interactive today. Attention to task is improving as well. PMV was placed for 16 min with no overt signs to suggest intolerance. His voice is dysphonic but he can initiate phonation more consistently than in previous trials. He benefits from cues to increase volume and pause between words to increase intelligibility of speech. He says that at baseline he is often told he "talks too fast." He is asking appropriate questions about what has happened to him this hospitalization and is also asking for water. Educated him about potential for swallow eval at some point but currently with NGT to suction, so oral care was offered for comfort. Recommend that pt wear PMV intermittently whenever full supervision can be provided by staff.    HPI [name], bradycardia and hypotension. Pt with bil pleural effusion and pericardial effusion s/p VATS with pericardial window 8/19. 8/20 cardiac tamponade with arrest,  intubated with bedside mediastinal exploration and ECMO via fem access. OR 8/21 for reexploration due to mediastinal hemorrhage. 8/28 OR for washout with ECMO decannulation and Impella placed. 8/29 brief PEA arrest with DCCV to NSR. 9/2 chest closure. 9/7 trach and Impella removed.9/11 VAC removed. PMHx: CAD s/p recent CABG x5 (admitted  8/3-11/23 with post op Afib), CKD stage II, DM, HTN, AKI, Afib and CVA      SLP Plan  Continue with current plan of care      Recommendations for follow up therapy are one component of a multi-disciplinary discharge planning process, led by the attending physician.  Recommendations may be updated based on patient status, additional  functional criteria and insurance authorization.    Recommendations         Patient may use Passy-Muir Speech Valve: Intermittently with supervision;During all therapies with supervision PMSV Supervision: Full MD: Please consider changing trach tube to : Cuffless;Smaller size         Oral Care Recommendations: Oral care QID Follow Up Recommendations: SLP at Long-term acute care hospital Assistance recommended at discharge: Frequent or constant Supervision/Assistance SLP Visit Diagnosis: Aphonia (R49.1) Plan: Continue with current plan of care           Osie Bond., M.A. [place] Office [phone]  Secure chat preferred   10/29/2021, 11:02 AM

## 2021-10-29 NOTE — Consult Note (Signed)
[facility] Nurse wound follow up Patient receiving care in [facility]. Primary RN present at time of my assessment. Wound type: unstageable wounds to bilateral buttocks and coccyx area. Measurement: left buttock wound measures 10.2 cm x 7 cm x unknown depth. This wound is primarily off-white in color with islands of pink peeking through. There is one area that is brown in color. The coccyx area/upper intergluteal cleft space is light brown in color. This wound measures 5.5 cm x 2 cm. The right buttock wound is off white with islands of pink. It measures 7.8 cm x 5 cm. Wound bed: as above Drainage (amount, consistency, odor) no odor. Flexiseal in use Periwound: intact. No other wounds found to the buttocks or ischial areas. Dressing procedure/placement/frequency: I have modified the existing santyl order to reflect use of santyl on all the wounds listed above, topped with xeroform and a foam dressing. This is a daily dressing.  Val Riles, RN, MSN, CWOCN, CNS-BC, pager [phone]

## 2021-10-29 NOTE — Progress Notes (Signed)
Physical Therapy Treatment Patient Details Name: [name] MRN: [id number] DOB: 1960/09/08 Today's Date: 10/29/2021   History of Present Illness Pt is a 61 y.o. male admitted 09/28/21 with SOB, bradycardia, hypotension. Workup for bilateral pleural effusion, pericardial effusion s/p VATS with pericardial window 8/19. Cardiac arrest with tamponade 8/20, intubated with bedside mediastinal exploration, ECMO via fem access. Pt with mediastinal hemorrhage s/p reexploration 8/21. To OR 8/28 for washout, ECMO decannulation, Impella placement. CRRT initiated 8/28. Brief PEA arrest 8/29 with DCCV to NSR.  S/p chest closure 9/2. S/p trach and impella removed 9/7. S/p wound vac removal 9/11. PMH includes CAD (s/p recent CABG 09/12/21), CKD2, DM, HTN, AKI, afib, CVA.    PT Comments    Pt pleasant, alert and oriented with continued sacral pain and left thigh itching making positioning for comfort challenging. Pt with increased bil quad control and activation today able to perform mini squats at 30 and 40 degree tilt without physical assist beyond maintaining foot control. Pt may do well standing in PRAFOs or shoes next session. Pt educated for all precautions and progression.  Total tilt 10-50 degrees this session, 17 min.   100% on 28% trach collar HR 78    Recommendations for follow up therapy are one component of a multi-disciplinary discharge planning process, led by the attending physician.  Recommendations may be updated based on patient status, additional functional criteria and insurance authorization.  Follow Up Recommendations  PT at Long-term acute care hospital Can patient physically be transported by private vehicle: No   Assistance Recommended at Discharge Frequent or constant Supervision/Assistance  Patient can return home with the following Two people to help with walking and/or transfers;Two people to help with bathing/dressing/bathroom;Direct supervision/assist for medications  management;Assistance with feeding;Assistance with cooking/housework;Direct supervision/assist for financial management;Assist for transportation   Equipment Recommendations  Other (comment) (TBD with progression)    Recommendations for Other Services       Precautions / Restrictions Precautions Precautions: Sternal;Fall;Other (comment) Precaution Comments: CRRT (holiday today), trach, flexiseal, cortrak, NGT     Mobility  Bed Mobility Overal bed mobility: Needs Assistance Bed Mobility: Rolling Rolling: Mod assist         General bed mobility comments: mod assist to roll for positioning pillow    Transfers Overall transfer level: Needs assistance   Transfers: Sit to/from Stand Sit to Stand: Total assist, +2 physical assistance, From elevated surface           General transfer comment: tilt 10 degrees- 4 min 98/56(69), 30degrees 4 min 99/56 (66), 40 degrees 5 min 110/67 (78), 50 degrees 2 min with decreased trunk control and ability to maintain LLE in proper position due to hyperextension despite pad rolled behind knee and ankle supported in place 92/56 (68) with HR 84, back to 30 degrees 2 min prior to supine and positioning in semichair    Ambulation/Gait               General Gait Details: unable   Stairs             Wheelchair Mobility    Modified Rankin (Stroke Patients Only)       Balance   Sitting-balance support: Feet supported, No upper extremity supported Sitting balance-Leahy Scale: Poor Sitting balance - Comments: left lean in sitting     Standing balance-Leahy Scale: Poor Standing balance comment: left lean in tilt >40 degrees with trunk and leg straps  Cognition Arousal/Alertness: Awake/alert Behavior During Therapy: Flat affect Overall Cognitive Status: Within Functional Limits for tasks assessed                                 General Comments: pt oriented and following  all commands, at times with delay due to fatigue and delayed muscle activation        Exercises General Exercises - Upper Extremity Shoulder Flexion: AAROM, Both, 10 reps, Standing General Exercises - Lower Extremity Heel Slides: AAROM, Both, 5 reps, Supine Mini-Sqauts: AROM, Both, 10 reps, Standing (performed at 10 degrees and 40 degrees)    General Comments        Pertinent Vitals/Pain Pain Assessment Pain Score: 4  Pain Location: sacrum Pain Descriptors / Indicators: Sore, Aching Pain Intervention(s): Limited activity within patient's tolerance, Monitored during session, Repositioned    Home Living                          Prior Function            PT Goals (current goals can now be found in the care plan section) Progress towards PT goals: Progressing toward goals    Frequency    Min 3X/week      PT Plan Current plan remains appropriate    Co-evaluation              AM-PAC PT "6 Clicks" Mobility   Outcome Measure  Help needed turning from your back to your side while in a flat bed without using bedrails?: A Lot Help needed moving from lying on your back to sitting on the side of a flat bed without using bedrails?: Total Help needed moving to and from a bed to a chair (including a wheelchair)?: Total Help needed standing up from a chair using your arms (e.g., wheelchair or bedside chair)?: Total Help needed to walk in hospital room?: Total Help needed climbing 3-5 steps with a railing? : Total 6 Click Score: 7    End of Session   Activity Tolerance: Patient tolerated treatment well Patient left: in bed;with call bell/phone within reach;with nursing/sitter in room;with SCD's reapplied Nurse Communication: Mobility status PT Visit Diagnosis: Other abnormalities of gait and mobility (R26.89);Difficulty in walking, not elsewhere classified (R26.2);Muscle weakness (generalized) (M62.81)     Time: 0822-0905 PT Time Calculation (min) (ACUTE  ONLY): 43 min  Charges:  $Therapeutic Exercise: 8-22 mins $Therapeutic Activity: 23-37 mins                     [name], PT Acute Rehabilitation Services Office: [phone]    Sandy Salaam Praneel Haisley 10/29/2021, 10:45 AM

## 2021-10-30 ENCOUNTER — Ambulatory Visit: Payer: Self-pay | Admitting: Surgery

## 2021-10-30 LAB — RENAL FUNCTION PANEL
Albumin: 2.3 g/dL — ABNORMAL LOW (ref 3.5–5.0)
Albumin: 2.3 g/dL — ABNORMAL LOW (ref 3.5–5.0)
Anion gap: 10 (ref 5–15)
Anion gap: 16 — ABNORMAL HIGH (ref 5–15)
BUN: 54 mg/dL — ABNORMAL HIGH (ref 8–23)
BUN: 75 mg/dL — ABNORMAL HIGH (ref 8–23)
CO2: 27 mmol/L (ref 22–32)
CO2: 19 mmol/L — ABNORMAL LOW (ref 22–32)
Calcium: 9.5 mg/dL (ref 8.9–10.3)
Calcium: 9.2 mg/dL (ref 8.9–10.3)
Chloride: 106 mmol/L (ref 98–111)
Chloride: 108 mmol/L (ref 98–111)
Creatinine, Ser: 3.5 mg/dL — ABNORMAL HIGH (ref 0.61–1.24)
Creatinine, Ser: 4.62 mg/dL — ABNORMAL HIGH (ref 0.61–1.24)
GFR, Estimated: 19 mL/min — ABNORMAL LOW (ref >60)
GFR, Estimated: 14 mL/min — ABNORMAL LOW (ref >60)
Glucose, Bld: 131 mg/dL — ABNORMAL HIGH (ref 70–99)
Glucose, Bld: 192 mg/dL — ABNORMAL HIGH (ref 70–99)
Phosphorus: 4.6 mg/dL (ref 2.5–4.6)
Phosphorus: 5.6 mg/dL — ABNORMAL HIGH (ref 2.5–4.6)
Potassium: 4.5 mmol/L (ref 3.5–5.1)
Potassium: 4.4 mmol/L (ref 3.5–5.1)
Sodium: 143 mmol/L (ref 135–145)
Sodium: 143 mmol/L (ref 135–145)

## 2021-10-30 LAB — GLUCOSE, CAPILLARY
Glucose-Capillary: 138 mg/dL — ABNORMAL HIGH (ref 70–99)
Glucose-Capillary: 121 mg/dL — ABNORMAL HIGH (ref 70–99)
Glucose-Capillary: 111 mg/dL — ABNORMAL HIGH (ref 70–99)
Glucose-Capillary: 167 mg/dL — ABNORMAL HIGH (ref 70–99)
Glucose-Capillary: 98 mg/dL (ref 70–99)
Glucose-Capillary: 102 mg/dL — ABNORMAL HIGH (ref 70–99)

## 2021-10-30 LAB — CBC
HCT: 27.8 % — ABNORMAL LOW (ref 39.0–52.0)
Hemoglobin: 8.1 g/dL — ABNORMAL LOW (ref 13.0–17.0)
MCH: 29.7 pg (ref 26.0–34.0)
MCHC: 29.1 g/dL — ABNORMAL LOW (ref 30.0–36.0)
MCV: 101.8 fL — ABNORMAL HIGH (ref 80.0–100.0)
Platelets: 358 10*3/uL (ref 150–400)
RBC: 2.73 MIL/uL — ABNORMAL LOW (ref 4.22–5.81)
RDW: 22.2 % — ABNORMAL HIGH (ref 11.5–15.5)
WBC: 26.5 10*3/uL — ABNORMAL HIGH (ref 4.0–10.5)
nRBC: 2.7 % — ABNORMAL HIGH (ref 0.0–0.2)

## 2021-10-30 LAB — HEPATITIS B SURFACE ANTIGEN: Hepatitis B Surface Ag: NONREACTIVE

## 2021-10-30 LAB — HEPATITIS B SURFACE ANTIBODY,QUALITATIVE: Hep B S Ab: NONREACTIVE

## 2021-10-30 LAB — HEPATITIS B CORE ANTIBODY, TOTAL: Hep B Core Total Ab: NONREACTIVE

## 2021-10-30 LAB — HEPATITIS C ANTIBODY: HCV Ab: NONREACTIVE

## 2021-10-30 LAB — MAGNESIUM: Magnesium: 2.7 mg/dL — ABNORMAL HIGH (ref 1.7–2.4)

## 2021-10-30 LAB — COOXEMETRY PANEL
Carboxyhemoglobin: 2 % — ABNORMAL HIGH (ref 0.5–1.5)
Methemoglobin: 0.7 % (ref 0.0–1.5)
O2 Saturation: 51 %
Total hemoglobin: 8.2 g/dL — ABNORMAL LOW (ref 12.0–16.0)

## 2021-10-30 MED ORDER — PANCRELIPASE (LIP-PROT-AMYL) 10440-39150 UNITS PO TABS
20880.0000 [IU] | ORAL_TABLET | Freq: Once | ORAL | Status: DC
  Filled 2021-10-30: qty 2

## 2021-10-30 MED ORDER — CHLORHEXIDINE GLUCONATE CLOTH 2 % EX PADS
6.0000 | MEDICATED_PAD | Freq: Every day | CUTANEOUS | Status: DC
  Administered 2021-10-31 – 2021-11-01 (×2): 6 via TOPICAL

## 2021-10-30 MED ORDER — SODIUM CHLORIDE 0.9 % IV SOLN
100.0000 mg | INTRAVENOUS | Status: AC
  Administered 2021-10-30 – 2021-11-05 (×7): 100 mg via INTRAVENOUS
  Filled 2021-10-30 (×7): qty 5

## 2021-10-30 MED ORDER — SODIUM BICARBONATE 650 MG PO TABS
650.0000 mg | ORAL_TABLET | Freq: Once | ORAL | Status: DC
  Filled 2021-10-30: qty 1

## 2021-10-30 NOTE — Progress Notes (Signed)
Physical Therapy Treatment Patient Details Name: [name] MRN: [id number] DOB: 28-Dec-1960 Today's Date: 10/30/2021   History of Present Illness Pt is a 61 y.o. male admitted 09/28/21 with SOB, bradycardia, hypotension. Workup for bilateral pleural effusion, pericardial effusion s/p VATS with pericardial window 8/19. Cardiac arrest with tamponade 8/20, intubated with bedside mediastinal exploration, ECMO via fem access. Pt with mediastinal hemorrhage s/p reexploration 8/21. To OR 8/28 for washout, ECMO decannulation, Impella placement. CRRT initiated 8/28. Brief PEA arrest 8/29 with DCCV to NSR.  S/p chest closure 9/2. S/p trach and impella removed 9/7. S/p wound vac removal 9/11. Course complicated by bouts of hypotension and bradycardia. Off CRRT 9/19. PMH includes CAD (s/p recent CABG 09/12/21), CKD2, DM, HTN, AKI, afib, CVA.   PT Comments    Pt seen by request of nursing as pt restless and falls frequently between tilt bed rails (more easily than they suspect he would in a regular ICU bed). Pt received with BLEs out of bed between rails. Today's session focused on sitting tolerance and standing trials. Pt requiring mod-maxA+2 for standing with RW and then with stedy frame due to bilateral knee instability. Pt requiring frequent assist for RUE/RLE management and cues for safety and sequencing. Noted improving BP tolerance to upright; BP 116/65 post-standing. Feel pt could begin to progress out of bed more frequently via maxisky lift with nursing; ultimately ok to transition out of tilt bed if needed; RN aware. Will continue to follow acutely to address established goals.     Recommendations for follow up therapy are one component of a multi-disciplinary discharge planning process, led by the attending physician.  Recommendations may be updated based on patient status, additional functional criteria and insurance authorization.  Follow Up Recommendations  PT at Long-term acute care hospital Can  patient physically be transported by private vehicle: No   Assistance Recommended at Discharge Frequent or constant Supervision/Assistance  Patient can return home with the following Two people to help with walking and/or transfers;Two people to help with bathing/dressing/bathroom;Direct supervision/assist for medications management;Assistance with feeding;Assistance with cooking/housework;Direct supervision/assist for financial management;Assist for transportation   Equipment Recommendations  Other (defer to next venue)    Recommendations for Other Services       Precautions / Restrictions Precautions Precautions: Sternal;Fall;Other (comment) Precaution Comments: Off CRRT today, trach collar, cortrak, NGT; slight bowel incontinence     Mobility  Bed Mobility Overal bed mobility: Needs Assistance Bed Mobility: Supine to Sit, Sit to Supine     Supine to sit: Mod assist, +2 for safety/equipment, HOB elevated Sit to supine: Total assist, +2 for physical assistance, +2 for safety/equipment   General bed mobility comments: pt rolled onto R side with BLEs off L side edge of bed, modA for trunk elevation and scooting hips to EOB, L lateral lean and pt frequently attempting to lean forward requiring maxA to maintain sitting, +2 safety. significant fatigue with standing trials requiring totalA for return to supine    Transfers Overall transfer level: Needs assistance Equipment used: Rolling walker (2 wheels), Ambulation equipment used Transfers: Sit to/from Stand Sit to Stand: Mod assist, +2 physical assistance, Max assist, +2 safety/equipment           General transfer comment: initial stand from EOB with modA+2 for trnk elevation and stability, pt unable to maintain RUE grip on RW requiring maxA to keep hand on RW, good ability to extend hips to achieve fully upright standing with multimodal cues, but resulted in bilateral knee instability requiring return  to sit. additional standing  trial with stedy, mod-maxA+2 standing frmo EOB and stedy seat with LUE support on stedy bar and bilateral knees blocked    Ambulation/Gait                   Stairs             Wheelchair Mobility    Modified Rankin (Stroke Patients Only)       Balance Overall balance assessment: Needs assistance Sitting-balance support: Feet supported, No upper extremity supported Sitting balance-Leahy Scale: Poor Sitting balance - Comments: L lateral lean and pt frequently attempting to stand requiring consistent min-maxA to maintain static sitting   Standing balance support: During functional activity, Reliant on assistive device for balance Standing balance-Leahy Scale: Poor Standing balance comment: reliant on UE support and external assist for static standing with RW and in stedy frame, bilateral knee instability                            Cognition Arousal/Alertness: Awake/alert Behavior During Therapy: Flat affect Overall Cognitive Status: Difficult to assess                                 General Comments: pt answering majority of questions appropriately by mouthing words. pt following majority of simple commands with increased time and cuing. poor safety awareness and decreased attention, requiring frequent cues for safety to prevent moving prematurely, attempting to stand without assist, etc. difficulty coordinating RUE/RLE movements requiring multimodal cues        Exercises      General Comments General comments (skin integrity, edema, etc.): SpO2 stable on 5L O2 at 28% FiO2 via trach collar; HR 87; BP stable, up to 116/65 post-standing trials with return to supine. RN present to assist with line management and perform washup at end of session as pt with bowel incontinence      Pertinent Vitals/Pain Pain Assessment Pain Assessment: Faces Faces Pain Scale: Hurts a little bit Pain Location: throat Pain Descriptors / Indicators:  Sore Pain Intervention(s): Monitored during session    Home Living                          Prior Function            PT Goals (current goals can now be found in the care plan section) Progress towards PT goals: Progressing toward goals (goals updated)    Frequency    Min 3X/week      PT Plan Current plan remains appropriate    Co-evaluation PT/OT/SLP Co-Evaluation/Treatment: Yes Reason for Co-Treatment: Complexity of the patient's impairments (multi-system involvement);Necessary to address cognition/behavior during functional activity;For patient/therapist safety;To address functional/ADL transfers          AM-PAC PT "6 Clicks" Mobility   Outcome Measure  Help needed turning from your back to your side while in a flat bed without using bedrails?: A Lot Help needed moving from lying on your back to sitting on the side of a flat bed without using bedrails?: A Lot Help needed moving to and from a bed to a chair (including a wheelchair)?: Total Help needed standing up from a chair using your arms (e.g., wheelchair or bedside chair)?: Total Help needed to walk in hospital room?: Total Help needed climbing 3-5 steps with a railing? : Total 6 Click Score: 8  End of Session Equipment Utilized During Treatment: Gait belt Activity Tolerance: Patient tolerated treatment well Patient left: in bed;with call bell/phone within reach;with nursing/sitter in room Nurse Communication: Mobility status;Need for lift equipment PT Visit Diagnosis: Other abnormalities of gait and mobility (R26.89);Difficulty in walking, not elsewhere classified (R26.2);Muscle weakness (generalized) (M62.81)     Time: 7106-2694 PT Time Calculation (min) (ACUTE ONLY): 24 min  Charges:  $Therapeutic Activity: 8-22 mins                     Mabeline Caras, PT, DPT Acute Rehabilitation Services  Personal: [facility] Office: [place] 10/30/2021, 12:21 PM

## 2021-10-30 NOTE — Progress Notes (Signed)
Subjective:  CRRT stopped yesterday-  no UOP and labs worsening but off pressors-  he is very thirsty and frustrated that he cannot have anything  Objective Vital signs in last 24 hours: Vitals:   10/30/21 0600 10/30/21 0630 10/30/21 0700 10/30/21 0756  BP: 108/60 109/63 (!) 110/58   Pulse:  77 82 82  Resp:  (!) 21 (!) 24 (!) 24  Temp:  98.7 F (37.1 C)    TempSrc:  Oral    SpO2: 97% 100% 100%   Weight:  84.9 kg    Height:       Weight change: -5 kg  Intake/Output Summary (Last 24 hours) at 10/30/2021 0804 Last data filed at 10/30/2021 0700 Gross per 24 hour  Intake 1871.39 ml  Output 1200 ml  Net 671.39 ml   Assessment/Plan:    Pt is a 61 y.o. yo male  with HTN, HLD, DM, A-fib, stroke, CKD, CAD status post CABG on 8/3 presented with SOB, found cardiac tamponade, course complicated by cardiac arrest, use of ECMO, seen as a consultation for the evaluation of AKI    #Acute kidney injury on CKD IIIa.  Ischemic ATN due to cardiogenic shock/cardiac arrest.  Started CRRT on 8/28 for decreasing urine output and fluid/volume management.  Brief (less than 24 hours) line free interval after clotting and CRRT resumed  on 9/9 after nontunneled line with critical care - since  CRRT was going to clot -  we stopped CRRT on 9/19-  no indications for any RRT today but suspect will need tomorrow-  will plan for IHD since now off pressors   Appreciate IR input-  following for an opportunity to place [facility]    #Cardiac tamponade/hemorrhagic pericarditis status post pericardial drain placement with subsequent ECMO placement.  decannulated  with placement of Impella.  S/p closure of the sternum and mediastinal washout on 9/2.   #Cardiogenic shock/cardiac arrest: Was on ECMO. Continue pressors per primary team.  Impella out on 9/7- remarkable improvement    #Acute hypoxic respiratory failure:  improving - now on TC.  optimizing volume with CRRT -  euvolemic at present    #Acute blood loss anemia:  Monitor hemoglobin and transfuse as needed by ICU team.  Aranesp 40 mcg weekly on Mondays started on 9/4.  Increased dose to 100 mcg weekly       Louis Meckel    Labs: Basic Metabolic Panel: Recent Labs  Lab 10/29/21 0441 10/29/21 1648 10/30/21 0414  NA 137 140 143  K 4.5 4.3 4.5  CL 105 106 106  CO2 26 24 27   GLUCOSE 154* 115* 131*  BUN 21 37* 54*  CREATININE 1.70* 2.47* 3.50*  CALCIUM 8.5* 8.6* 9.5  PHOS 2.7 4.2 4.6   Liver Function Tests: Recent Labs  Lab 10/29/21 0441 10/29/21 1648 10/30/21 0414  ALBUMIN 2.3* 2.3* 2.3*   No results for input(s): "LIPASE", "AMYLASE" in the last 168 hours. No results for input(s): "AMMONIA" in the last 168 hours. CBC: Recent Labs  Lab 10/25/21 0339 10/25/21 1500 10/26/21 0429 10/27/21 0428 10/28/21 0415 10/28/21 1556 10/29/21 0441  WBC 19.1*  --  18.7* 22.9* 17.2*  --  23.2*  HGB 7.6*   < > 7.4* 8.1* 7.6* 8.4* 8.2*  HCT 24.9*   < > 25.1* 26.5* 25.8* 28.6* 28.4*  MCV 94.7  --  98.0 96.0 98.1  --  100.4*  PLT 392  --  387 451* 345  --  350   < > =  values in this interval not displayed.   Cardiac Enzymes: Recent Labs  Lab 10/23/21 1432  CKTOTAL 407*   CBG: Recent Labs  Lab 10/29/21 1137 10/29/21 1534 10/29/21 2016 10/29/21 2341 10/30/21 0337  GLUCAP 141* 86 187* 139* 138*    Iron Studies: No results for input(s): "IRON", "TIBC", "TRANSFERRIN", "FERRITIN" in the last 72 hours. Studies/Results: DG Abd Portable 1V  Result Date: 10/29/2021 CLINICAL DATA:  Feeding tube placement EXAM: PORTABLE ABDOMEN - 1 VIEW COMPARISON:  Previous studies including the examination done earlier today FINDINGS: There is interval change in location of tip of feeding tube which is noted in the course of proximal jejunum. There is NG tube with its tip in the stomach. Bowel gas pattern is nonspecific. IMPRESSION: Tip of feeding tube is seen in the proximal course of jejunum. Electronically Signed   By: Elmer Picker M.D.    On: 10/29/2021 14:42   DG CHEST PORT 1 VIEW  Result Date: 10/29/2021 CLINICAL DATA:  Respiratory failure. EXAM: PORTABLE CHEST 1 VIEW COMPARISON:  Chest x-ray dated October 23, 2021. FINDINGS: Unchanged tracheostomy tube and right internal jugular central venous catheter. New left upper extremity PICC line with tip at the cavoatrial junction. Unchanged enteric and feeding tubes. Stable cardiomediastinal silhouette status post CABG. Normal pulmonary vascularity. Improved aeration at the left lung base. No pneumothorax. No acute osseous abnormality. IMPRESSION: 1. New left upper extremity PICC line in good position. 2. Improved aeration at the left lung base. Electronically Signed   By: Titus Dubin M.D.   On: 10/29/2021 08:23   DG Abd 1 View  Result Date: 10/29/2021 CLINICAL DATA:  Abdominal distention. EXAM: ABDOMEN - 1 VIEW COMPARISON:  Abdominal x-ray dated October 24, 2021. FINDINGS: Feeding tube tip has retracted in the interval, with new redundancy in the stomach and the tip now likely within the first portion of the duodenum. Unchanged large bore enteric tube in the stomach. The bowel gas pattern is normal. No radio-opaque calculi or other significant radiographic abnormality are seen. IMPRESSION: 1. Feeding tube tip has retracted in the interval, with new redundancy in the stomach and the tip now likely within the first portion of the duodenum. Electronically Signed   By: Titus Dubin M.D.   On: 10/29/2021 08:13   Medications: Infusions:   prismasol BGK 4/2.5 500 mL/hr at 10/29/21 0429    prismasol BGK 4/2.5 300 mL/hr at 10/29/21 3335   sodium chloride 10 mL/hr at 10/29/21 1900   sodium chloride     albumin human Stopped (10/15/21 1901)   feeding supplement (VITAL 1.5 CAL) 60 mL/hr at 10/30/21 0700   micafungin (MYCAMINE) 100 mg in sodium chloride 0.9 % 100 mL IVPB     norepinephrine (LEVOPHED) Adult infusion Stopped (10/29/21 1923)   prismasol BGK 4/2.5 1,300 mL/hr at 10/29/21  0715    Scheduled Medications:  sodium chloride   Intravenous Once   acetaminophen (TYLENOL) oral liquid 160 mg/5 mL  650 mg Per Tube Q6H   ascorbic acid  250 mg Per Tube BID   aspirin  81 mg Per Tube Daily   Chlorhexidine Gluconate Cloth  6 each Topical Daily   collagenase   Topical Daily   darbepoetin (ARANESP) injection - NON-DIALYSIS  100 mcg Subcutaneous Q Mon-1800   feeding supplement (PROSource TF20)  60 mL Per Tube TID   fiber  1 packet Per Tube BID   folic acid  1 mg Per Tube Daily   heparin injection (subcutaneous)  5,000 Units Subcutaneous  Q8H   insulin aspart  0-20 Units Subcutaneous Q4H   insulin aspart  2 Units Subcutaneous Q4H   insulin glargine-yfgn  10 Units Subcutaneous QHS   midodrine  15 mg Per Tube TID WC   multivitamin  1 tablet Per Tube QHS   nutrition supplement (JUVEN)  1 packet Per Tube BID BM   mouth rinse  15 mL Mouth Rinse 4 times per day   oxyCODONE  10 mg Per Tube Q6H   Followed by   Derrill Memo ON 11/01/2021] oxyCODONE  5 mg Per Tube Q6H   pantoprazole  40 mg Per Tube BID   QUEtiapine  50 mg Per Tube QHS   sodium chloride flush  10-40 mL Intracatheter Q12H   sodium chloride flush  10-40 mL Intracatheter Q12H   sodium chloride flush  3 mL Intravenous Q12H   thiamine  100 mg Per Tube Daily   zinc sulfate  220 mg Per Tube Daily    have reviewed scheduled and prn medications.  Physical Exam: General: more awake on TC this AM-  begging for something to drink Heart: RRR Lungs: mostly clear Abdomen: slightly distended Extremities: min edema Dialysis Access: right IJ dialysis cath-  60 days old     10/30/2021,8:04 AM  LOS: 32 days

## 2021-10-30 NOTE — Progress Notes (Signed)
[place] Progress Note Patient Name: [name] DOB: 06/03/1960 MRN: [id number]   Date of Service  10/30/2021  HPI/Events of Note  Patient attempting to crawl out of bed, posing a fall risk.  eICU Interventions  Posey restraint ordered.        [name] 10/30/2021, 8:59 PM

## 2021-10-30 NOTE — Progress Notes (Addendum)
Patient ID: [name], male   DOB: 1960-12-05, 61 y.o.   MRN: [id number]   Advanced Heart Failure Rounding Note   Subjective:    - 8/19 Pericardial window - 8/20 Cardiac arrest with tamponade -> Emergent bedside washout - 09/29/21 VA Cannulation - 09/30/21 Return to OR for mediastinal hemorrhage - 10/01/21 Developed AF -> amio - 10/02/21 TEE EF 25-30%  - 10/04/21 OR for washout. C/b continued bleeding overnight - 10/07/21 Placement of Impella 5.5 with washout, VA ECMO decannulation. Hypotensive with development of severe RV dysfunction after ECMO off and pressors titrated up. Multiple units of blood products. - 10/08/21 Brief PEA arrest. AFL with RVR >> S/p DCCV to SR, back in AFL shortly after - 8/31 Give 1UPRBCs  - 9/2 OR for chest closure - 9/3 Hypotensive overnight w/ SBPs in 80s. Febrile, mTemp 100.8. CRRT paused. VP increased to 0.04.  - 9/4 s/p bronchoscopy w/ BAL by PCCM, Cx NGTD  - 9/5 vomiting w/ large volume NGT output + watery/foul diarrhea, Tube feeds held. No signs of ileus on KUB. C-diff negative   - 9/7 OR for Impella Extraction and percutaneous tracheostomy. 2 u RBCs. - 9/8 CVVH stopped and line removed for holiday - 9/9 CVVH restarted.  -9/13 Worsening leukocytosis and pressor requirements. Purulent drainage from arterial line. Started Daptomycin, Micafungin and Meropenem. - 9/17 Daptomycin + Micafungin stopped. Remains on Meropenum  -9/19 CRRT holiday.   Off Norepi.   Overnight climbed out of bed. PICC and flexiseal pulled out. Has abrasion on R knee.   Asking for water.    Objective:     Vital Signs:   Temp:  [98.3 F (36.8 C)-99.1 F (37.3 C)] 99.1 F (37.3 C) (09/19 2348) Pulse Rate:  [75-90] 82 (09/20 0700) Resp:  [8-30] 24 (09/20 0700) BP: (79-145)/(42-107) 110/58 (09/20 0700) SpO2:  [94 %-100 %] 100 % (09/20 0700) FiO2 (%):  [28 %] 28 % (09/20 0339) Weight:  [84.9 kg] 84.9 kg (09/20 0630) Last BM Date : 10/29/21  Weight change: Filed Weights    10/28/21 0500 10/29/21 0500 10/30/21 0630  Weight: 89.3 kg 89.9 kg 84.9 kg    Intake/Output:   Intake/Output Summary (Last 24 hours) at 10/30/2021 0735 Last data filed at 10/30/2021 0400 Gross per 24 hour  Intake 1641.39 ml  Output 1763 ml  Net -121.61 ml    General:  Trach Collar HEENT: + NG + Cortrak Neck: Trach.  no JVD. Carotids 2+ bilat; no bruits. No lymphadenopathy or thryomegaly appreciated. RIJ HD catheter Cor: PMI nondisplaced. Regular rate & rhythm. No rubs, gallops or murmurs. Lungs: clear Abdomen: soft, nontender, nondistended. No hepatosplenomegaly. No bruits or masses. Good bowel sounds. Extremities: no cyanosis, clubbing, rash, edema Neuro: alert & orientedx3, cranial nerves grossly intact. moves all 4 extremities w/o difficulty.  Telemetry: SR 80s  Labs: Basic Metabolic Panel: Recent Labs  Lab 10/26/21 0429 10/26/21 1550 10/27/21 0428 10/27/21 1717 10/28/21 0415 10/28/21 1556 10/29/21 0441 10/29/21 1648 10/30/21 0414  NA 136   < > 138   < > 138 138 137 140 143  K 3.9   < > 4.3   < > 4.0 4.3 4.5 4.3 4.5  CL 102   < > 103   < > 103 105 105 106 106  CO2 25   < > 24   < > _0 GLUCOSE 250*   < > 144*   < > 192* 127* 154* 115* 131*  BUN 15   < >  15   < > _0 37* 54*  CREATININE 1.09   < > 1.20   < > 1.65* 1.71* 1.70* 2.47* 3.50*  CALCIUM 8.2*   < > 8.6*   < > 8.2* 8.3* 8.5* 8.6* 9.5  MG 2.5*  --  2.7*  --  2.5*  --  2.5*  --  2.7*  PHOS 1.7*   < > 2.0*   < > 2.5 2.4* 2.7 4.2 4.6   < > = values in this interval not displayed.    Liver Function Tests: Recent Labs  Lab 10/28/21 0415 10/28/21 1556 10/29/21 0441 10/29/21 1648 10/30/21 0414  ALBUMIN 2.3* 2.3* 2.3* 2.3* 2.3*   No results for input(s): "LIPASE", "AMYLASE" in the last 168 hours. No results for input(s): "AMMONIA" in the last 168 hours.  CBC: Recent Labs  Lab 10/25/21 0339 10/25/21 1500 10/26/21 0429 10/27/21 0428 10/28/21 0415 10/28/21 1556 10/29/21 0441  WBC  19.1*  --  18.7* 22.9* 17.2*  --  23.2*  HGB 7.6*   < > 7.4* 8.1* 7.6* 8.4* 8.2*  HCT 24.9*   < > 25.1* 26.5* 25.8* 28.6* 28.4*  MCV 94.7  --  98.0 96.0 98.1  --  100.4*  PLT 392  --  387 451* 345  --  350   < > = values in this interval not displayed.    Cardiac Enzymes: Recent Labs  Lab 10/23/21 1432  CKTOTAL 407*    BNP: BNP (last 3 results) Recent Labs    12/24/20 0651 09/28/21 0405  BNP 412.0* 826.6*    ProBNP (last 3 results) No results for input(s): "PROBNP" in the last 8760 hours.    Other results:  Imaging: DG Abd Portable 1V  Result Date: 10/29/2021 CLINICAL DATA:  Feeding tube placement EXAM: PORTABLE ABDOMEN - 1 VIEW COMPARISON:  Previous studies including the examination done earlier today FINDINGS: There is interval change in location of tip of feeding tube which is noted in the course of proximal jejunum. There is NG tube with its tip in the stomach. Bowel gas pattern is nonspecific. IMPRESSION: Tip of feeding tube is seen in the proximal course of jejunum. Electronically Signed   By: Elmer Picker M.D.   On: 10/29/2021 14:42   DG CHEST PORT 1 VIEW  Result Date: 10/29/2021 CLINICAL DATA:  Respiratory failure. EXAM: PORTABLE CHEST 1 VIEW COMPARISON:  Chest x-ray dated October 23, 2021. FINDINGS: Unchanged tracheostomy tube and right internal jugular central venous catheter. New left upper extremity PICC line with tip at the cavoatrial junction. Unchanged enteric and feeding tubes. Stable cardiomediastinal silhouette status post CABG. Normal pulmonary vascularity. Improved aeration at the left lung base. No pneumothorax. No acute osseous abnormality. IMPRESSION: 1. New left upper extremity PICC line in good position. 2. Improved aeration at the left lung base. Electronically Signed   By: Titus Dubin M.D.   On: 10/29/2021 08:23   DG Abd 1 View  Result Date: 10/29/2021 CLINICAL DATA:  Abdominal distention. EXAM: ABDOMEN - 1 VIEW COMPARISON:  Abdominal  x-ray dated October 24, 2021. FINDINGS: Feeding tube tip has retracted in the interval, with new redundancy in the stomach and the tip now likely within the first portion of the duodenum. Unchanged large bore enteric tube in the stomach. The bowel gas pattern is normal. No radio-opaque calculi or other significant radiographic abnormality are seen. IMPRESSION: 1. Feeding tube tip has retracted in the interval, with new redundancy in the stomach and the tip now  likely within the first portion of the duodenum. Electronically Signed   By: Titus Dubin M.D.   On: 10/29/2021 08:13     Medications:     Scheduled Medications:  sodium chloride   Intravenous Once   acetaminophen (TYLENOL) oral liquid 160 mg/5 mL  650 mg Per Tube Q6H   ascorbic acid  250 mg Per Tube BID   aspirin  81 mg Per Tube Daily   Chlorhexidine Gluconate Cloth  6 each Topical Daily   collagenase   Topical Daily   darbepoetin (ARANESP) injection - NON-DIALYSIS  100 mcg Subcutaneous Q Mon-1800   feeding supplement (PROSource TF20)  60 mL Per Tube TID   fiber  1 packet Per Tube BID   folic acid  1 mg Per Tube Daily   heparin injection (subcutaneous)  5,000 Units Subcutaneous Q8H   insulin aspart  0-20 Units Subcutaneous Q4H   insulin aspart  2 Units Subcutaneous Q4H   insulin glargine-yfgn  10 Units Subcutaneous QHS   midodrine  15 mg Per Tube TID WC   multivitamin  1 tablet Per Tube QHS   nutrition supplement (JUVEN)  1 packet Per Tube BID BM   mouth rinse  15 mL Mouth Rinse 4 times per day   oxyCODONE  10 mg Per Tube Q6H   Followed by   [START ON 11/01/2021] oxyCODONE  5 mg Per Tube Q6H   pantoprazole  40 mg Per Tube BID   QUEtiapine  50 mg Per Tube QHS   sodium chloride flush  10-40 mL Intracatheter Q12H   sodium chloride flush  10-40 mL Intracatheter Q12H   sodium chloride flush  3 mL Intravenous Q12H   thiamine  100 mg Per Tube Daily   zinc sulfate  220 mg Per Tube Daily    Infusions:   prismasol BGK 4/2.5 500  mL/hr at 10/29/21 0429    prismasol BGK 4/2.5 300 mL/hr at 10/29/21 0508   sodium chloride 10 mL/hr at 10/29/21 1900   sodium chloride     albumin human Stopped (10/15/21 1901)   feeding supplement (VITAL 1.5 CAL) 60 mL/hr at 10/30/21 0400   micafungin (MYCAMINE) 200 mg in sodium chloride 0.9 % 100 mL IVPB Stopped (10/29/21 1103)   norepinephrine (LEVOPHED) Adult infusion Stopped (10/29/21 1923)   prismasol BGK 4/2.5 1,300 mL/hr at 10/29/21 0715    PRN Medications: sodium chloride, albumin human, camphor-menthol, clonazePAM, dextrose, diphenhydrAMINE, heparin, hydrALAZINE, HYDROmorphone (DILAUDID) injection, ipratropium-albuterol, ondansetron (ZOFRAN) IV, mouth rinse, polyethylene glycol, polyvinyl alcohol, sodium chloride flush, sodium chloride flush, sodium chloride flush, white petrolatum   Assessment/Plan:    1. Shock - mixed cardiogenic/hemorrhagic -> VA ECMO -> decannulated on 8/28 to Impella 5.5 - Echo 08/28: Underfilled LV, EF 55-60% with severe LVH and near normal RV systsystolic function.  - Impella extracted 9/7 -Bedside echo 09/13 - LV function preserved, RV mildly reduced. Small clot in posterior pericardium but no hemodynamic effect - Concern for worsening sepsis with leukocytosis and increasing pressor requirements on 09/13. Purulent drainage from Aline, replaced. - BC X 2 NGTD, tracheal aspirate pending. Procalcitonin 1.63.  - Given prolonged abx course earlier in stay, addition of Daptomycin, Micafungin and meropenem recommended per ID.  - Plan to complete total of 14 days of Micafungin .  - Off Norepi.On midodrine 15 mg tid - Stable BP.  -  Place PIV.    2. Cardiac arrest (PEA/bradycardic) - 8/19 in setting of tamponade - PEA arrest again on 08/28  3. Cardiac  tamponade with emergent bedside sternotomy  - Diffuse epicardial bleeding with post-op Dresslers - return to OR 8/21 and 8/24 for washouts.  - Washout 8/28 in OR. Multiple units of blood products in OR.   -  Chest closed on 9/2 - Hgb 8.1>7.6>8.2> CBC pending.  4. Acute hypoxemic respiratory failure - Off ECMO.  -  Perc Trach placed 9/7  - s/p bronchoscopy w/ BAL 9/4.  - tracheal aspirate 09/13 growing few GPC and rare yeast  - Back on IV abx as above d/t concern for sepsis. Management per CCM  5. AKI due to ATN - CRRT started 08/28.  - Remains on anuric - 9/19 given CRRT holiday. Now off pressors -  Plan for transition to iHD soon   6. 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 Now off colchicine with CRRT.  7. 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 Continue aspirin 81 mg daily  8. DM2 - continue SSI   9. PAF/AFL - Tolerates poorly.  - Recurrent AFL. S/p DCCV  to SR 08/29.  - Off amio d/t bradycardia - Not on [facility] for now d/t bleeding, DVT dose heparin.  - In SR   10. ID - Completed vancomycin/meropenem with open chest.  - WBC 17.2 >23> pending. . ? Possible aspiration with vomiting.  -On meropnenum.   11. Neuro Oriented x3  12. FEN - TFs ongoing .   13. Hyperkalemia - Resolved   14. Unstageable Pressure Ulcer, Buttock  WOC following.   15. Abdominal Pain -9/19 N/V - NG  >400 cc out.  - Resolved.  -Discussed with CCM/CT surgery.   Continues to improve. ? LTAC  Darrick Grinder, NP  7:35 AM  Patient seen and examined with the above-signed Advanced Practice Provider and/or Housestaff. I personally reviewed laboratory data, imaging studies and relevant notes. I independently examined the patient and formulated the important aspects of the plan. I have edited the note to reflect any of my changes or salient points. I have personally discussed the plan with the patient and/or family.  Remains on trach collar. Off pressors. BP stable. Tried to get out of bed and had a fall. Planning to switch to iHD today  General:  Weak appearing. No resp difficulty HEENT: normal + cor track Neck: supple. + trach  Cor: PMI nondisplaced. Regular rate & rhythm.  No rubs, gallops or murmurs. Lungs: clear Abdomen: soft, nontender, nondistended. No hepatosplenomegaly. No bruits or masses. Good bowel sounds. Extremities: no cyanosis, clubbing, rash, edema Neuro: awake alert follows commands remains weak   Improving slowly. Hemodynamics improved. Agree with plan to transition to iHD. Needs intensive rehab. Continue micafungin for now. ? LTAC placement.  Glori Bickers, MD  12:26 PM

## 2021-10-30 NOTE — Progress Notes (Signed)
Call to room pt found pt laying on the floor beside bed. Pt not connected to monitor. Pt alert, vitals assessed and WN. Pt denies any pain and denies hitting head. Pt alert and oriented x 4 with full range of motion for all extremities.No visible signs of deformities or injury. MD notified. No additional orders.

## 2021-10-30 NOTE — Procedures (Signed)
Tracheostomy Change Note  Patient Details:   Name: [name] DOB: 26-Jan-1961 MRN: [id number]    Airway Documentation: Lurline Idol was changed from a 8 cuffed shiley to 6 cuffless shiley per MD order. Myself & Kathlee Nations RRT performed changed with no problems. Placement confirmed by positive color change on CO2 detector, bilateral BS, & secretions coughed up through new trach.   Evaluation  O2 sats: stable throughout Complications: No apparent complications Patient did tolerate procedure well. Bilateral Breath Sounds: Clear    Kathie Dike 10/30/2021, 4:08 PM

## 2021-10-30 NOTE — Progress Notes (Signed)
Occupational Therapy Treatment Patient Details Name: [name] MRN: [id number] DOB: 13-Sep-1960 Today's Date: 10/30/2021   History of present illness Pt is a 61 y.o. male admitted 09/28/21 with SOB, bradycardia, hypotension. Workup for bilateral pleural effusion, pericardial effusion s/p VATS with pericardial window 8/19. Cardiac arrest with tamponade 8/20, intubated with bedside mediastinal exploration, ECMO via fem access. Pt with mediastinal hemorrhage s/p reexploration 8/21. To OR 8/28 for washout, ECMO decannulation, Impella placement. CRRT initiated 8/28. Brief PEA arrest 8/29 with DCCV to NSR.  S/p chest closure 9/2. S/p trach and impella removed 9/7. S/p wound vac removal 9/11. Course complicated by bouts of hypotension and bradycardia. Off CRRT 9/19. PMH includes CAD (s/p recent CABG 09/12/21), CKD2, DM, HTN, AKI, afib, CVA.   OT comments  Patient in bed with BLEs out between rails upon entry, pt with poor awareness of deficits and need for assist.  Nursing reporting unwitnessed fall yesterday, as he tends to exit bed between rails.  He required +2 mod assist safety for bed mobility, sitting EOB requires max assist to maintain upright posture (forward lean) and cueing to avoid attempting to stand prematurely, mod-max assist +2 for transfers with poor tolerance. BP stable during session, 116/65 after activity.  He continues to demonstrate decreased attention, safety awareness, problem solving throughout session; following 1 step commands with increased time.  Returned to supine at end of session, RN assisting with bath.  Will follow.    Recommendations for follow up therapy are one component of a multi-disciplinary discharge planning process, led by the attending physician.  Recommendations may be updated based on patient status, additional functional criteria and insurance authorization.    Follow Up Recommendations  OT at Long-term acute care hospital    Assistance Recommended at Discharge  Frequent or constant Supervision/Assistance  Patient can return home with the following  Two people to help with bathing/dressing/bathroom;Two people to help with walking and/or transfers;Direct supervision/assist for medications management;Direct supervision/assist for financial management;Assist for transportation;Help with stairs or ramp for entrance;Assistance with cooking/housework   Equipment Recommendations  Other (comment) (defer)    Recommendations for Other Services      Precautions / Restrictions Precautions Precautions: Sternal;Fall;Other (comment) Precaution Booklet Issued: No Precaution Comments: Off CRRT today, trach collar, cortrak, NGT; slight bowel incontinence Restrictions Weight Bearing Restrictions: Yes Other Position/Activity Restrictions: sternal       Mobility Bed Mobility Overal bed mobility: Needs Assistance Bed Mobility: Supine to Sit, Sit to Supine     Supine to sit: Mod assist, +2 for safety/equipment, HOB elevated Sit to supine: Total assist, +2 for physical assistance, +2 for safety/equipment   General bed mobility comments: pt rolled onto R side with BLEs off L side edge of bed, modA for trunk elevation and scooting hips to EOB, L lateral lean and pt frequently attempting to lean forward requiring maxA to maintain sitting, +2 safety. significant fatigue with standing trials requiring totalA for return to supine    Transfers Overall transfer level: Needs assistance Equipment used: Rolling walker (2 wheels), Ambulation equipment used Transfers: Sit to/from Stand Sit to Stand: Mod assist, +2 physical assistance, Max assist, +2 safety/equipment           General transfer comment: initial stand from EOB with modA+2 for trnk elevation and stability, pt unable to maintain RUE grip on RW requiring maxA to keep hand on RW, good ability to extend hips to achieve fully upright standing with multimodal cues, but resulted in bilateral knee instability  requiring return to  sit. additional standing trial with stedy, mod-maxA+2 standing frmo EOB and stedy seat with LUE support on stedy bar and bilateral knees blocked     Balance Overall balance assessment: Needs assistance Sitting-balance support: Feet supported, No upper extremity supported Sitting balance-Leahy Scale: Poor Sitting balance - Comments: L lateral lean and pt frequently attempting to stand requiring consistent min-maxA to maintain static sitting   Standing balance support: During functional activity, Bilateral upper extremity supported Standing balance-Leahy Scale: Poor Standing balance comment: reliant on UE support and external assist for static standing with RW and in stedy frame, bilateral knee instability                           ADL either performed or assessed with clinical judgement   ADL Overall ADL's : Needs assistance/impaired                     Lower Body Dressing: Total assistance;+2 for physical assistance;+2 for safety/equipment;Sit to/from stand                      Extremity/Trunk Assessment              Vision       Perception     Praxis      Cognition Arousal/Alertness: Awake/alert Behavior During Therapy: Flat affect Overall Cognitive Status: Difficult to assess Area of Impairment: Attention, Memory, Following commands, Problem solving, Safety/judgement, Awareness                   Current Attention Level: Sustained Memory: Decreased short-term memory, Decreased recall of precautions Following Commands: Follows one step commands consistently, Follows one step commands with increased time Safety/Judgement: Decreased awareness of safety, Decreased awareness of deficits Awareness: Intellectual Problem Solving: Slow processing, Difficulty sequencing, Requires verbal cues General Comments: pt mouthing words to answer questions appropirately.  Pt requiring increased time to follow commands, initate tasks.   Demonstrating poor safety, attention, and attempting to stand without assist.        Exercises      Shoulder Instructions       General Comments SpO2 stable on 5L 28% FiO2 via trach collar. BP stable during session 116/65 post activity. RN present to assist with line mgmt during session.    Pertinent Vitals/ Pain       Pain Assessment Pain Assessment: Faces Faces Pain Scale: Hurts a little bit Pain Location: throat Pain Descriptors / Indicators: Sore Pain Intervention(s): Limited activity within patient's tolerance, Monitored during session, Repositioned  Home Living                                          Prior Functioning/Environment              Frequency  Min 2X/week        Progress Toward Goals  OT Goals(current goals can now be found in the care plan section)  Progress towards OT goals: Progressing toward goals  Acute Rehab OT Goals Patient Stated Goal: not stated Time For Goal Achievement: 11/04/21 Potential to Achieve Goals: Good  Plan Discharge plan remains appropriate;Frequency remains appropriate    Co-evaluation    PT/OT/SLP Co-Evaluation/Treatment: Yes Reason for Co-Treatment: Complexity of the patient's impairments (multi-system involvement);Necessary to address cognition/behavior during functional activity;For patient/therapist safety;To address functional/ADL transfers   OT goals addressed  during session: ADL's and self-care      AM-PAC OT "6 Clicks" Daily Activity     Outcome Measure   Help from another person eating meals?: Total Help from another person taking care of personal grooming?: Total Help from another person toileting, which includes using toliet, bedpan, or urinal?: Total Help from another person bathing (including washing, rinsing, drying)?: Total Help from another person to put on and taking off regular upper body clothing?: Total Help from another person to put on and taking off regular lower body  clothing?: Total 6 Click Score: 6    End of Session Equipment Utilized During Treatment: Oxygen;Gait belt  OT Visit Diagnosis: Unsteadiness on feet (R26.81);Other abnormalities of gait and mobility (R26.89);Muscle weakness (generalized) (M62.81)   Activity Tolerance Patient tolerated treatment well   Patient Left in bed;with call bell/phone within reach;with bed alarm set;with nursing/sitter in room   Nurse Communication Mobility status        Time: 3009-2330 OT Time Calculation (min): 23 min  Charges: OT General Charges $OT Visit: 1 Visit OT Treatments $Self Care/Home Management : 8-22 mins  [place] Office [phone]   Delight Stare 10/30/2021, 1:44 PM

## 2021-10-30 NOTE — Progress Notes (Signed)
NAME:  [name], MRN:  [id number], DOB:  [birth year], LOS: 29 ADMISSION DATE:  09/28/2021, CONSULTATION DATE:  09/29/2021 REFERRING MD:  Kipp Brood - TCTS CHIEF COMPLAINT: Dyspnea   History of Present Illness:  61 year old man who underwent CABG on 8/3, readmitted on 8/19 in setting of dyspnea with a pericardial effusion requiring pericardial window via bedside sternotomy. Also with bilateral pleural effusions. Sustained PEA arrest on 8/20, briefly required VA ECMO which was discontinued on 8/28, Impella placed 8/28, started on CRRT.  Hypoxemia, repeat cardiac arrest on 8/30. Underwent mediastinal washout/sternotomy closure 9/2. Impella removed and tracheostomy completed 9/7.  Pertinent Medical History:  CAD s/p CABG 8/3 DM2 Diverticulosis GERD Hyperlipidemia Hypertension History of stroke Tobacco Abuse  Significant Hospital Events: Including procedures, antibiotic start and stop dates in addition to other pertinent events   8/19 Admitted, s/p pericardial window 8/20 PEA cardiac arrest, left pigtail chest tube placement, PEA cardiac arrest due to tamponade, bedside sternotomy with pericardial drains placed. 8/21 Return to OR for overnight bleeding.  8/22 Minimal chest tube output.  Atrial fibrillation, controled with amiodarone.  8/22 TEE showed EF 35% at baseline, which improved significantly with decreasing ECMO flow.  8/23 Tolerated diuresis  8/24 Mediastinal washout, removal of hematoma 8/25 Ongoing bleeding issues from posterior mediastinum 8/28 Decannulated and off ECMO, 5.5 Impella placed, L femoral HD cath placed and started on CRRT 8/30 Hypoxia and hypotension followed by brief arrest, gentle L lateral chest compressions performed and ROSC in roughly 1 minute. 8/31 Remains deeply sedated on ventilator with open chest.  No acute events overnight. 9/1 No acute events overnight, remains on ventilator deep sedation.  Tolerating aggressive volume removal per CRRT 9/2 Sternotomy  closure 9/3 Wean versed 9/4 FOB, low grade temps, notable thick mucus/frequent suctioning 9/5 Vomiting, ?mucus plugging. PSV 5-6 hours on 10/5 9/6 Impella down to p3 9/7 Impella removal and trach. PRBC and DDAVP in OR  9/8 Low dose epi, weaned on 8/8 9/9 RIJ HD catheter placed 9/11 Started on ATC at 28% and tolerating well 9/12 Remains on 28% ATC 9/13 SB/Junction rhythm with associated hypotension overnight requiring transient pressor escalation. Pressor requirement down in AM. WBC count 23 (18), Co-ox 36%. Resp/Blood Cx. 9/14 Mild bradycardia/relative hypotension with Precedex. Remains on Epi 75mcg, Vaso 0.03. Resp Cx with few gram+ cocci (clusters), yeast. BCx NGTD. 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 Co-ox 48%. CRRT stopped. 9/15 WBC continues to downtrend. Hgb 7.6. Net +1.49L/24H. CRRT resumed. Co-ox 51%. NE started to help with Epi wean. 9/16, Co. oximetry improved, WBC trending down, weaning pressors 9/19 on trach collar for 96 hours.  Weaning off vasopressors.  Trial off CRRT today.  Episode of overnight emesis. 9/20 more awake. CRRT off  Interim History / Subjective:   No further vomiting. Tried to get out of bed this morning. Complains of thirst .  Objective:  Blood pressure (!) 110/58, pulse 85, temperature 98.7 F (37.1 C), temperature source Oral, resp. rate (!) 23, height 5\' 7"  (1.702 m), weight 84.9 kg, SpO2 98 %. CVP:  [3 mmHg-13 mmHg] 7 mmHg  FiO2 (%):  [28 %] 28 %   Intake/Output Summary (Last 24 hours) at 10/30/2021 1310 Last data filed at 10/30/2021 1220 Gross per 24 hour  Intake 1422.92 ml  Output 860 ml  Net 562.92 ml    Filed Weights   10/28/21 0500 10/29/21 0500 10/30/21 0630  Weight: 89.3 kg 89.9 kg 84.9 kg   Physical Examination: General: Acutely ill-appearing  middle-aged man in NAD. Appears uncomfortable. HEENT: [place]/AT, anicteric sclera, PERRL, moist mucous membranes. Trach in place. Neuro:  Awake, able to speak  clearly with PMV.  Responds to verbal stimuli. Following commands intermittently. Moves all 4 extremities spontaneously. CV: RRR, no m/g/r. PULM: Mildly tachypneic, breathing unlabored on ATC (5L, FiO2 28%). Lung fields clear.  GI: Soft, nontender, nondistended. Hypoactive bowel sounds. Extremities: Bilateral symmetric 1+ LE edema noted. Skin: Warm/dry, no rashes.  Resolved Hospital Problem List:   Acute RV failure > resolved  Assessment & Plan:   Acute metabolic encephalopathy -Continue to wean all sedatives off: Oxycodone taper in place, Seroquel nightly only, Klonopin as needed only  Hemorrhagic pericarditis with tamponade PEA arrest due to tamponade, s/p emergent sternotomy Cardiogenic & hemorrhagic shock, s/p VA ECMO (decannulated 8/29), s/p Impella (removed 9/7) -Continue to wean norepinephrine to off.  CAD s/p CABG x3 Afib  -Presently rate controlled  Leukocytosis without signs of sepsis Pleated course of daptomycin, completed course of meropenem today. -Finish off 14-day course of micafungin as patient at high risk for fungemia.  Acute respiratory failure with hypoxemia and hypercarbia S/p Tracheostomy  -Continue trach collar trial. -Passy-Muir valve. - Downsize to cuffless tracheostomy.  AKI with anuria  Hypophosphatemia -Transition to IHD. -After discussion with TTTS nephrology, okay for PICC placement, ordered 10/27/2021 -Permcath placement once leukocytosis improving.    Vomiting Diarrhea, improving - TF via Cotrak -Continue to wean narcotics. - NGT to gravity.  -   DM II with hyperglycemia -Presently well controlled  At Risk Malnutrition  - TF via Cortrak -Swallow evaluation.   Sacral pressure wound - WOC following  Severe physical deconditioning -Continue physical therapy.  Best Practice (right click and "Reselect all SmartList Selections" daily)  Diet/type: tubefeeds  DVT prophylaxis: prophylactic heparin  GI prophylaxis: PPI Lines:  Central line, Dialysis Catheter, and Arterial Line Foley:  N/A Code Status:  full code Last date of multidisciplinary goals of care discussion          Kipp Brood, MD [facility] 10/30/21 1:10 PM  Please see [email or web address] for contact info details.  From 7A-7P if no response, please call [phone] After hours, please call ELink [phone]

## 2021-10-30 NOTE — Progress Notes (Signed)
EVENING ROUNDS NOTE :     [address]       [place] [address]             [phone]                 13 Days Post-Op Procedure(s) (LRB): REMOVAL OF IMPELLA LEFT VENTRICULAR ASSIST DEVICE (Right) TRANSESOPHAGEAL ECHOCARDIOGRAM (TEE) (N/A) PERCUTANEOUS TRACHEOSTOMY USING SHILEY FLEXIBLE 8 mm CUFFED TRACH. (N/A)   Total Length of Stay:  LOS: 32 days  Events:   No events Worked with PT today    BP (!) 113/59   Pulse 80   Temp 98.7 F (37.1 C) (Oral)   Resp (!) 24   Ht 5\' 7"  (1.702 m)   Wt 84.9 kg   SpO2 100%   BMI 29.32 kg/m   CVP:  [1 mmHg-17 mmHg] 15 mmHg  FiO2 (%):  [28 %] 28 %   sodium chloride 10 mL/hr at 10/29/21 1900   sodium chloride     albumin human Stopped (10/15/21 1901)   feeding supplement (VITAL 1.5 CAL) 60 mL/hr at 10/30/21 1900   micafungin (MYCAMINE) 100 mg in sodium chloride 0.9 % 100 mL IVPB Stopped (10/30/21 1203)   norepinephrine (LEVOPHED) Adult infusion Stopped (10/29/21 1923)    I/O last 3 completed shifts: In: 2656.4 [I.V.:81.4; NG/GT:2260; IV Piggyback:315] Out: 2123 [Emesis/NG output:1060; Stool:1000]      Latest Ref Rng & Units 10/30/2021    8:24 AM 10/29/2021    4:41 AM 10/28/2021    3:56 PM  CBC  WBC 4.0 - 10.5 K/uL 26.5  23.2    Hemoglobin 13.0 - 17.0 g/dL 8.1  8.2  8.4   Hematocrit 39.0 - 52.0 % 27.8  28.4  28.6   Platelets 150 - 400 K/uL 358  350         Latest Ref Rng & Units 10/30/2021    4:36 PM 10/30/2021    4:14 AM 10/29/2021    4:48 PM  BMP  Glucose 70 - 99 mg/dL 192  131  115   BUN 8 - 23 mg/dL 75  54  37   Creatinine 0.61 - 1.24 mg/dL 4.62  3.50  2.47   Sodium 135 - 145 mmol/L 143  143  140   Potassium 3.5 - 5.1 mmol/L 4.4  4.5  4.3   Chloride 98 - 111 mmol/L 108  106  106   CO2 22 - 32 mmol/L 19  27  24    Calcium 8.9 - 10.3 mg/dL 9.2  9.5  8.6     ABG    Component Value Date/Time   PHART 7.301 (L) 10/22/2021 2354   PCO2ART 38.5 10/22/2021 2354   PO2ART 73 (L) 10/22/2021 2354   HCO3  19.3 (L) 10/22/2021 2354   TCO2 20 (L) 10/22/2021 2354   ACIDBASEDEF 7.0 (H) 10/22/2021 2354   O2SAT 51 10/30/2021 0422       Melodie Bouillon, MD 10/30/2021 8:21 PM

## 2021-10-31 ENCOUNTER — Inpatient Hospital Stay (HOSPITAL_COMMUNITY): Payer: Self-pay

## 2021-10-31 LAB — CBC
HCT: 27.4 % — ABNORMAL LOW (ref 39.0–52.0)
Hemoglobin: 8 g/dL — ABNORMAL LOW (ref 13.0–17.0)
MCH: 29.2 pg (ref 26.0–34.0)
MCHC: 29.2 g/dL — ABNORMAL LOW (ref 30.0–36.0)
MCV: 100 fL (ref 80.0–100.0)
Platelets: 390 10*3/uL (ref 150–400)
RBC: 2.74 MIL/uL — ABNORMAL LOW (ref 4.22–5.81)
RDW: 22.3 % — ABNORMAL HIGH (ref 11.5–15.5)
WBC: 25.5 10*3/uL — ABNORMAL HIGH (ref 4.0–10.5)
nRBC: 1.9 % — ABNORMAL HIGH (ref 0.0–0.2)

## 2021-10-31 LAB — HEPATITIS B SURFACE ANTIBODY, QUANTITATIVE: Hep B S AB Quant (Post): <3.1 m[IU]/mL — ABNORMAL LOW (ref >9.9)

## 2021-10-31 LAB — RENAL FUNCTION PANEL
Albumin: 2.4 g/dL — ABNORMAL LOW (ref 3.5–5.0)
Albumin: 2.2 g/dL — ABNORMAL LOW (ref 3.5–5.0)
Anion gap: 19 — ABNORMAL HIGH (ref 5–15)
Anion gap: 13 (ref 5–15)
BUN: 98 mg/dL — ABNORMAL HIGH (ref 8–23)
BUN: 115 mg/dL — ABNORMAL HIGH (ref 8–23)
CO2: 20 mmol/L — ABNORMAL LOW (ref 22–32)
CO2: 19 mmol/L — ABNORMAL LOW (ref 22–32)
Calcium: 9.9 mg/dL (ref 8.9–10.3)
Calcium: 9 mg/dL (ref 8.9–10.3)
Chloride: 105 mmol/L (ref 98–111)
Chloride: 109 mmol/L (ref 98–111)
Creatinine, Ser: 5.75 mg/dL — ABNORMAL HIGH (ref 0.61–1.24)
Creatinine, Ser: 6.21 mg/dL — ABNORMAL HIGH (ref 0.61–1.24)
GFR, Estimated: 10 mL/min — ABNORMAL LOW (ref >60)
GFR, Estimated: 10 mL/min — ABNORMAL LOW (ref >60)
Glucose, Bld: 110 mg/dL — ABNORMAL HIGH (ref 70–99)
Glucose, Bld: 121 mg/dL — ABNORMAL HIGH (ref 70–99)
Phosphorus: 6.8 mg/dL — ABNORMAL HIGH (ref 2.5–4.6)
Phosphorus: 7 mg/dL — ABNORMAL HIGH (ref 2.5–4.6)
Potassium: 5.2 mmol/L — ABNORMAL HIGH (ref 3.5–5.1)
Potassium: 5.6 mmol/L — ABNORMAL HIGH (ref 3.5–5.1)
Sodium: 144 mmol/L (ref 135–145)
Sodium: 141 mmol/L (ref 135–145)

## 2021-10-31 LAB — COOXEMETRY PANEL
Carboxyhemoglobin: 2.8 % — ABNORMAL HIGH (ref 0.5–1.5)
Methemoglobin: <0.7 % (ref 0.0–1.5)
O2 Saturation: 74.8 %
Total hemoglobin: 8.4 g/dL — ABNORMAL LOW (ref 12.0–16.0)

## 2021-10-31 LAB — GLUCOSE, CAPILLARY
Glucose-Capillary: 111 mg/dL — ABNORMAL HIGH (ref 70–99)
Glucose-Capillary: 118 mg/dL — ABNORMAL HIGH (ref 70–99)
Glucose-Capillary: 140 mg/dL — ABNORMAL HIGH (ref 70–99)
Glucose-Capillary: 103 mg/dL — ABNORMAL HIGH (ref 70–99)
Glucose-Capillary: 146 mg/dL — ABNORMAL HIGH (ref 70–99)

## 2021-10-31 LAB — MAGNESIUM: Magnesium: 3 mg/dL — ABNORMAL HIGH (ref 1.7–2.4)

## 2021-10-31 MED ORDER — HEPARIN SODIUM (PORCINE) 1000 UNIT/ML IJ SOLN
INTRAMUSCULAR | Status: AC
  Filled 2021-10-31: qty 4

## 2021-10-31 MED ORDER — HEPARIN SODIUM (PORCINE) 1000 UNIT/ML IJ SOLN
INTRAMUSCULAR | Status: AC
  Administered 2021-10-31: 1000 [IU]
  Filled 2021-10-31: qty 4

## 2021-10-31 MED ORDER — ALBUMIN HUMAN 5 % IV SOLN
25.0000 g | Freq: Once | INTRAVENOUS | Status: DC
  Filled 2021-10-31: qty 500

## 2021-10-31 MED ORDER — PANCRELIPASE (LIP-PROT-AMYL) 10440-39150 UNITS PO TABS
20880.0000 [IU] | ORAL_TABLET | Freq: Once | ORAL | Status: AC
  Administered 2021-10-31: 20880 [IU]
  Filled 2021-10-31: qty 2

## 2021-10-31 MED ORDER — ANTICOAGULANT SODIUM CITRATE 4% (200MG/5ML) IV SOLN: 5.0000 mL | Status: DC | PRN

## 2021-10-31 MED ORDER — HEPARIN SODIUM (PORCINE) 1000 UNIT/ML DIALYSIS
1000.0000 [IU] | INTRAMUSCULAR | Status: DC | PRN
  Administered 2021-11-02 – 2021-11-14 (×3): 1000 [IU]
  Filled 2021-10-31 (×6): qty 1

## 2021-10-31 MED ORDER — GERHARDT'S BUTT CREAM
TOPICAL_CREAM | Freq: Every day | CUTANEOUS | Status: DC
  Administered 2021-10-31 – 2021-11-07 (×2): 1 via TOPICAL
  Filled 2021-10-31 (×5): qty 1

## 2021-10-31 MED ORDER — SODIUM BICARBONATE 650 MG PO TABS
650.0000 mg | ORAL_TABLET | Freq: Once | ORAL | Status: AC
  Administered 2021-10-31: 650 mg
  Filled 2021-10-31: qty 1

## 2021-10-31 MED ORDER — ALTEPLASE 2 MG IJ SOLR
2.0000 mg | Freq: Once | INTRAMUSCULAR | Status: AC | PRN
  Administered 2021-10-31: 2 mg
  Filled 2021-10-31: qty 2

## 2021-10-31 NOTE — Progress Notes (Signed)
Patient seen today by trach team for consult.  No education is needed at this time.  All necessary equipment is at beside.   Will continue to follow for progression.  

## 2021-10-31 NOTE — Progress Notes (Signed)
14 Days Post-Op Procedure(s) (LRB): REMOVAL OF IMPELLA LEFT VENTRICULAR ASSIST DEVICE (Right) TRANSESOPHAGEAL ECHOCARDIOGRAM (TEE) (N/A) PERCUTANEOUS TRACHEOSTOMY USING SHILEY FLEXIBLE 8 mm CUFFED TRACH. (N/A) Subjective: Uncomfortable from sacral pressure sores.  Stable night. Getting ready to start intermittent HD this morning.  Objective: Vital signs in last 24 hours: Pulse Rate:  [79-87] 81 (09/21 0600) Cardiac Rhythm: Normal sinus rhythm (09/20 2000) Resp:  [9-30] 21 (09/21 0600) BP: (82-127)/(45-87) 113/55 (09/21 0600) SpO2:  [96 %-100 %] 100 % (09/21 0600) FiO2 (%):  [28 %] 28 % (09/21 0322) Weight:  [88.4 kg] 88.4 kg (09/21 0600)  Hemodynamic parameters for last 24 hours: CVP:  [1 mmHg-17 mmHg] 8 mmHg  Intake/Output from previous day: 09/20 0701 - 09/21 0700 In: 815 [NG/GT:710; IV Piggyback:105] Out: 360 [Emesis/NG output:360] Intake/Output this shift: No intake/output data recorded.  General appearance: alert and cooperative Neurologic: intact Heart: regular rate and rhythm, S1, S2 normal, no murmur Lungs: clear to auscultation bilaterally Abdomen: soft, non-tender; bowel sounds normal Extremities: warm, no edema Wound: dressing on chest not changed but dry.   Lab Results: Recent Labs    10/30/21 0824 10/31/21 0506  WBC 26.5* 25.5*  HGB 8.1* 8.0*  HCT 27.8* 27.4*  PLT 358 390   BMET:  Recent Labs    10/30/21 1636 10/31/21 0506  NA 143 144  K 4.4 5.2*  CL 108 105  CO2 19* 20*  GLUCOSE 192* 110*  BUN 75* 98*  CREATININE 4.62* 5.75*  CALCIUM 9.2 9.9    PT/INR: No results for input(s): "LABPROT", "INR" in the last 72 hours. ABG    Component Value Date/Time   PHART 7.301 (L) 10/22/2021 2354   HCO3 19.3 (L) 10/22/2021 2354   TCO2 20 (L) 10/22/2021 2354   ACIDBASEDEF 7.0 (H) 10/22/2021 2354   O2SAT 74.8 10/31/2021 0506   CBG (last 3)  Recent Labs    10/30/21 2107 10/30/21 2318 10/31/21 0519  GLUCAP 98 102* 111*    Assessment/Plan: S/P  Procedure(s) (LRB): REMOVAL OF IMPELLA LEFT VENTRICULAR ASSIST DEVICE (Right) TRANSESOPHAGEAL ECHOCARDIOGRAM (TEE) (N/A) PERCUTANEOUS TRACHEOSTOMY USING SHILEY FLEXIBLE 8 mm CUFFED TRACH. (N/A)  Hemodynamically stable with MAP 68-74 off NE yesterday. CVP 8. Still on midodrine 15 tid.  Co-ox 75%.  Plan HD this am. May need BP support during run but we'll see.  Tolerating tube feeds at goal.  DM: glucose under good control. Pharmacy adjusting insulin.  Doing well on trach collar.  WBC remains elevated to 25.5. Only on micafungin at this time. No fever.  Continue dressing changes to wounds.  PT.  LOS: 33 days    [name] 10/31/2021

## 2021-10-31 NOTE — Progress Notes (Addendum)
Patient ID: SILAS MUFF, male   DOB: 1960-08-09, 61 y.o.   MRN: [id number]   Advanced Heart Failure Rounding Note   Subjective:    - 8/19 Pericardial window - 8/20 Cardiac arrest with tamponade -> Emergent bedside washout - 09/29/21 VA Cannulation - 09/30/21 Return to OR for mediastinal hemorrhage - 10/01/21 Developed AF -> amio - 10/02/21 TEE EF 25-30%  - 10/04/21 OR for washout. C/b continued bleeding overnight - 10/07/21 Placement of Impella 5.5 with washout, VA ECMO decannulation. Hypotensive with development of severe RV dysfunction after ECMO off and pressors titrated up. Multiple units of blood products. - 10/08/21 Brief PEA arrest. AFL with RVR >> S/p DCCV to SR, back in AFL shortly after - 8/31 Give 1UPRBCs  - 9/2 OR for chest closure - 9/3 Hypotensive overnight w/ SBPs in 80s. Febrile, mTemp 100.8. CRRT paused. VP increased to 0.04.  - 9/4 s/p bronchoscopy w/ BAL by PCCM, Cx NGTD  - 9/5 vomiting w/ large volume NGT output + watery/foul diarrhea, Tube feeds held. No signs of ileus on KUB. C-diff negative   - 9/7 OR for Impella Extraction and percutaneous tracheostomy. 2 u RBCs. - 9/8 CVVH stopped and line removed for holiday - 9/9 CVVH restarted.  -9/13 Worsening leukocytosis and pressor requirements. Purulent drainage from arterial line. Started Daptomycin, Micafungin and Meropenem. - 9/17 Daptomycin + Micafungin stopped. Remains on Meropenum  -9/19 CRRT holiday.  -9/20 Trach downsized   Complaining of buttock pain.    Objective:     Vital Signs:   Pulse Rate:  [79-87] 81 (09/21 0600) Resp:  [9-30] 21 (09/21 0600) BP: (82-127)/(45-87) 113/55 (09/21 0600) SpO2:  [96 %-100 %] 100 % (09/21 0600) FiO2 (%):  [28 %] 28 % (09/21 0322) Weight:  [88.4 kg] 88.4 kg (09/21 0600) Last BM Date : 10/30/21  Weight change: Filed Weights   10/29/21 0500 10/30/21 0630 10/31/21 0600  Weight: 89.9 kg 84.9 kg 88.4 kg    Intake/Output:   Intake/Output Summary (Last 24 hours) at  10/31/2021 0736 Last data filed at 10/30/2021 2000 Gross per 24 hour  Intake 815 ml  Output 360 ml  Net 455 ml   General: Trach collar  HEENT: +NG + Cortrak  Neck: Trach . no JVD. Carotids 2+ bilat; no bruits. No lymphadenopathy or thryomegaly appreciated.RIJ HD  Cor: PMI nondisplaced. Regular rate & rhythm. No rubs, gallops or murmurs. Lungs: Coarse throughout Abdomen: soft, nontender, nondistended. No hepatosplenomegaly. No bruits or masses. Good bowel sounds. Extremities: no cyanosis, clubbing, rash, edema Neuro: alert & orientedx3, cranial nerves grossly intact. moves all 4 extremities w/o difficulty. Affect pleasant  Telemetry: SR 80s   Labs: Basic Metabolic Panel: Recent Labs  Lab 10/27/21 0428 10/27/21 1717 10/28/21 0415 10/28/21 1556 10/29/21 0441 10/29/21 1648 10/30/21 0414 10/30/21 1636 10/31/21 0506  NA 138   < > 138   < > 137 140 143 143 144  K 4.3   < > 4.0   < > 4.5 4.3 4.5 4.4 5.2*  CL 103   < > 103   < > 105 106 106 108 105  CO2 24   < > 24   < > _0 19* 20*  GLUCOSE 144*   < > 192*   < > 154* 115* 131* 192* 110*  BUN 15   < > 21   < > 21 37* 54* 75* 98*  CREATININE 1.20   < > 1.65*   < > 1.70* 2.47* 3.50* 4.62*  5.75*  CALCIUM 8.6*   < > 8.2*   < > 8.5* 8.6* 9.5 9.2 9.9  MG 2.7*  --  2.5*  --  2.5*  --  2.7*  --  3.0*  PHOS 2.0*   < > 2.5   < > 2.7 4.2 4.6 5.6* 6.8*   < > = values in this interval not displayed.    Liver Function Tests: Recent Labs  Lab 10/29/21 0441 10/29/21 1648 10/30/21 0414 10/30/21 1636 10/31/21 0506  ALBUMIN 2.3* 2.3* 2.3* 2.3* 2.4*   No results for input(s): "LIPASE", "AMYLASE" in the last 168 hours. No results for input(s): "AMMONIA" in the last 168 hours.  CBC: Recent Labs  Lab 10/27/21 0428 10/28/21 0415 10/28/21 1556 10/29/21 0441 10/30/21 0824 10/31/21 0506  WBC 22.9* 17.2*  --  23.2* 26.5* 25.5*  HGB 8.1* 7.6* 8.4* 8.2* 8.1* 8.0*  HCT 26.5* 25.8* 28.6* 28.4* 27.8* 27.4*  MCV 96.0 98.1  --  100.4*  101.8* 100.0  PLT 451* 345  --  350 358 390    Cardiac Enzymes: No results for input(s): "CKTOTAL", "CKMB", "CKMBINDEX", "TROPONINI" in the last 168 hours.   BNP: BNP (last 3 results) Recent Labs    12/24/20 0651 09/28/21 0405  BNP 412.0* 826.6*    ProBNP (last 3 results) No results for input(s): "PROBNP" in the last 8760 hours.    Other results:  Imaging: DG Abd Portable 1V  Result Date: 10/29/2021 CLINICAL DATA:  Feeding tube placement EXAM: PORTABLE ABDOMEN - 1 VIEW COMPARISON:  Previous studies including the examination done earlier today FINDINGS: There is interval change in location of tip of feeding tube which is noted in the course of proximal jejunum. There is NG tube with its tip in the stomach. Bowel gas pattern is nonspecific. IMPRESSION: Tip of feeding tube is seen in the proximal course of jejunum. Electronically Signed   By: Elmer Picker M.D.   On: 10/29/2021 14:42     Medications:     Scheduled Medications:  sodium chloride   Intravenous Once   acetaminophen (TYLENOL) oral liquid 160 mg/5 mL  650 mg Per Tube Q6H   ascorbic acid  250 mg Per Tube BID   aspirin  81 mg Per Tube Daily   Chlorhexidine Gluconate Cloth  6 each Topical Daily   Chlorhexidine Gluconate Cloth  6 each Topical Q0600   collagenase   Topical Daily   darbepoetin (ARANESP) injection - NON-DIALYSIS  100 mcg Subcutaneous Q Mon-1800   feeding supplement (PROSource TF20)  60 mL Per Tube TID   fiber  1 packet Per Tube BID   folic acid  1 mg Per Tube Daily   heparin injection (subcutaneous)  5,000 Units Subcutaneous Q8H   heparin sodium (porcine)       insulin aspart  0-20 Units Subcutaneous Q4H   insulin aspart  2 Units Subcutaneous Q4H   insulin glargine-yfgn  10 Units Subcutaneous QHS   lipase/protease/amylase)  20,880 Units Per Tube Once   And   sodium bicarbonate  650 mg Per Tube Once   midodrine  15 mg Per Tube TID WC   multivitamin  1 tablet Per Tube QHS   nutrition  supplement (JUVEN)  1 packet Per Tube BID BM   mouth rinse  15 mL Mouth Rinse 4 times per day   oxyCODONE  10 mg Per Tube Q6H   Followed by   [START ON 11/01/2021] oxyCODONE  5 mg Per Tube Q6H   pantoprazole  40 mg Per Tube BID   QUEtiapine  50 mg Per Tube QHS   sodium chloride flush  10-40 mL Intracatheter Q12H   sodium chloride flush  10-40 mL Intracatheter Q12H   sodium chloride flush  3 mL Intravenous Q12H   thiamine  100 mg Per Tube Daily   zinc sulfate  220 mg Per Tube Daily    Infusions:  sodium chloride 10 mL/hr at 10/29/21 1900   sodium chloride     albumin human Stopped (10/15/21 1901)   albumin human     anticoagulant sodium citrate     feeding supplement (VITAL 1.5 CAL) Stopped (10/30/21 1801)   micafungin (MYCAMINE) 100 mg in sodium chloride 0.9 % 100 mL IVPB Stopped (10/30/21 1203)   norepinephrine (LEVOPHED) Adult infusion Stopped (10/29/21 1923)    PRN Medications: sodium chloride, albumin human, alteplase, anticoagulant sodium citrate, camphor-menthol, clonazePAM, dextrose, diphenhydrAMINE, heparin, heparin sodium (porcine), hydrALAZINE, HYDROmorphone (DILAUDID) injection, ipratropium-albuterol, ondansetron (ZOFRAN) IV, mouth rinse, polyethylene glycol, polyvinyl alcohol, sodium chloride flush, sodium chloride flush, sodium chloride flush, white petrolatum   Assessment/Plan:    1. Shock - mixed cardiogenic/hemorrhagic -> VA ECMO -> decannulated on 8/28 to Impella 5.5 - Echo 08/28: Underfilled LV, EF 55-60% with severe LVH and near normal RV systsystolic function.  - Impella extracted 9/7 -Bedside echo 09/13 - LV function preserved, RV mildly reduced. Small clot in posterior pericardium but no hemodynamic effect - Concern for worsening sepsis with leukocytosis and increasing pressor requirements on 09/13. Purulent drainage from [place], replaced. - BC X 2 NGTD, tracheal aspirate pending. Procalcitonin 1.63.  - Given prolonged abx course earlier in stay, addition of  Daptomycin, Micafungin and meropenem recommended per ID.  - Plan to complete total of 14 days of Micafungin .  - On midodrine 15 mg tid - Stable BP.   2. Cardiac arrest (PEA/bradycardic) - 8/19 in setting of tamponade - PEA arrest again on 08/28  3. Cardiac tamponade with emergent bedside sternotomy  - Diffuse epicardial bleeding with post-op Dresslers - return to OR 8/21 and 8/24 for washouts.  - Washout 8/28 in OR. Multiple units of blood products in OR.   - Chest closed on 9/2 - Hgb 8.1>7.6>8.2> 8  4. Acute hypoxemic respiratory failure - Off ECMO.  -  Perc Trach placed 9/7  - s/p bronchoscopy w/ BAL 9/4.  - tracheal aspirate 09/13 growing few GPC and rare yeast  - Back on IV abx as above d/t concern for sepsis.  - Trach downsized - Management per CCM  5. AKI due to ATN - CRRT started 08/28.  - Remains on anuric - 9/19 given CRRT holiday. Now off pressors -  Plan for transition to iHD today   6. 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 Now off colchicine with CRRT.  7. 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 Continue aspirin 81 mg daily  8. DM2 - continue SSI   9. PAF/AFL - Tolerates poorly.  - Recurrent AFL. S/p DCCV  to SR 08/29.  - Off amio d/t bradycardia - Not on [facility] for now d/t bleeding, DVT dose heparin.  - In Sr.   10. ID - Completed vancomycin/meropenem with open chest.  - WBC 17.2 >23> 26>23 . ? Possible aspiration with vomiting.  -On meropnenum.   11. Neuro Oriented x3  12. FEN - TFs ongoing .   13. Hyperkalemia - K 5.2 today. Starting iHD today.   14. Unstageable Pressure Ulcer, Buttock  WOC following.  15. Abdominal Pain -9/19 N/V - NG  360 out over the last 24 - Resolved.   Continues to improve. ? LTAC  Darrick Grinder, NP  7:36 AM  Patient seen and examined with the above-signed Advanced Practice Provider and/or Housestaff. I personally reviewed laboratory data, imaging studies and relevant notes. I  independently examined the patient and formulated the important aspects of the plan. I have edited the note to reflect any of my changes or salient points. I have personally discussed the plan with the patient and/or family.  Remains on trach collar. Trach downsized. Follows commands.   Still dialysis dependent. Switching to iHD today. Remains on micafungin.   General:  Awake follows commands HEENT: normal + Cor-track Neck: supple. + trach   Cor: PMI nondisplaced. Regular rate & rhythm. No rubs, gallops or murmurs. Lungs: clear Abdomen: soft, nontender, nondistended. No hepatosplenomegaly. No bruits or masses. Good bowel sounds. Extremities: no cyanosis, clubbing, rash, edema Neuro: awake follows commands  Continues to make progress. Switch ti iHD today. Continue PT/OT. Will d/w LTAC consult with SW/CM.   Glori Bickers, MD  8:39 AM

## 2021-10-31 NOTE — Evaluation (Signed)
Clinical/Bedside Swallow Evaluation Patient Details  Name: [name] MRN: [id number] Date of Birth: 12-23-1960  Today's Date: 10/31/2021 Time: SLP Start Time (ACUTE ONLY): 60 SLP Stop Time (ACUTE ONLY): 8 SLP Time Calculation (min) (ACUTE ONLY): 10 min  Past Medical History:  Past Medical History:  Diagnosis Date   AKI (acute kidney injury) ([place])    pt unaware of this   Anginal pain ([place])    CAD (coronary artery disease)    a. 03/2015 NSTEMI: LHC with severe 3V CAD  (70% mid RCA, 95% OM1, 90% distal LCx, 90% OM3, 80% prox LAD and 90% ost D1) s/p DES to mLAD w/ small dissction Rx with DES, staged ost Ramus PCI/DES and dLCx s/p PCI/DES    Chest pain 12/24/2020   Diabetes mellitus type 2 in obese [facility])    Diverticulosis    Dyspnea    Dyspnea on exertion 03/16/2015   Dyspnea on exertion   Family history of adverse reaction to anesthesia    patient father- pt states after anesthesia his father "developed dementia"   GERD (gastroesophageal reflux disease)    Hypercholesteremia    Hypertension associated with diabetes ([place]) 03/16/2015   hypertension   NSTEMI (non-ST elevated myocardial infarction) ([place]) 03/17/2015   Obesity    Stroke ([place]) 2022   pt states he had a "mini stroke" during cardiac catheterization   Tobacco abuse    Past Surgical History:  Past Surgical History:  Procedure Laterality Date   APPLICATION OF WOUND VAC N/A 10/03/2021   Procedure: APPLICATION OF WOUND VAC;  Surgeon: Dahlia Byes, MD;  Location: [place];  Service: Thoracic;  Laterality: N/A;   CANNULATION FOR ECMO (EXTRACORPOREAL MEMBRANE OXYGENATION) N/A 09/29/2021   Procedure: CANNULATION FOR ECMO (EXTRACORPOREAL MEMBRANE OXYGENATION);  Surgeon: Lajuana Matte, MD;  Location: [place];  Service: Open Heart Surgery;  Laterality: N/A;   CARDIAC CATHETERIZATION N/A 03/19/2015   Procedure: Left Heart Cath and Coronary Angiography;  Surgeon: Lorretta Harp, MD;  Location: [place] CV LAB;  Service:  Cardiovascular;  Laterality: N/A;   CARDIAC CATHETERIZATION N/A 03/20/2015   Procedure: Coronary Stent Intervention;  Surgeon: Lorretta Harp, MD;  Location: [place] CV LAB;  Service: Cardiovascular;  Laterality: N/A;   CARDIAC CATHETERIZATION N/A 03/22/2015   Procedure: Coronary Stent Intervention;  Surgeon: Lorretta Harp, MD;  Location: [place] CV LAB;  Service: Cardiovascular;  Laterality: N/A;   CORONARY ARTERY BYPASS GRAFT N/A 09/12/2021   Procedure: CORONARY ARTERY BYPASS GRAFTING (CABG) X 5 USING LEFT INTERNAL MAMMARY ARTERY AND ENDOSCOPICALLY HARVESTED RIGHT GREATER SAPHENOUS VEIN.;  Surgeon: [name], MD;  Location: [place];  Service: Open Heart Surgery;  Laterality: N/A;   EXPLORATION POST OPERATIVE OPEN HEART N/A 09/30/2021   Procedure: EXPLORATION POST OPERATIVE OPEN HEART WASHOUT;  Surgeon: Lajuana Matte, MD;  Location: [place];  Service: Open Heart Surgery;  Laterality: N/A;   LEFT HEART CATH AND CORONARY ANGIOGRAPHY N/A 12/25/2020   Procedure: LEFT HEART CATH AND CORONARY ANGIOGRAPHY;  Surgeon: Troy Sine, MD;  Location: [place] CV LAB;  Service: Cardiovascular;  Laterality: N/A;   LEFT HEART CATH AND CORONARY ANGIOGRAPHY N/A 12/26/2020   Procedure: LEFT HEART CATH AND CORONARY ANGIOGRAPHY;  Surgeon: Troy Sine, MD;  Location: [place] CV LAB;  Service: Cardiovascular;  Laterality: N/A;   MEDIASTINAL EXPLORATION N/A 10/03/2021   Procedure: MEDIASTINAL WASHOUT;  Surgeon: Dahlia Byes, MD;  Location: [place];  Service: Thoracic;  Laterality: N/A;  PUMP STANDBY  MEDIASTINAL EXPLORATION N/A 10/12/2021   Procedure: MEDIASTINAL WASHOUT;  Surgeon: [name], MD;  Location: [place];  Service: Thoracic;  Laterality: N/A;   PERCUTANEOUS TRACHEOSTOMY N/A 10/17/2021   Procedure: PERCUTANEOUS TRACHEOSTOMY USING SHILEY FLEXIBLE 8 mm CUFFED TRACH.;  Surgeon: Candee Furbish, MD;  Location: [place];  Service: Pulmonary;  Laterality: N/A;  Percutaneous tracheostomy    PERICARDIAL WINDOW Right 09/28/2021   Procedure: PERICARDIAL WINDOW;  Surgeon: Lajuana Matte, MD;  Location: [place];  Service: Thoracic;  Laterality: Right;  Right VATS.  Lazy lateral.  double lumen ET tube   PLACEMENT OF IMPELLA LEFT VENTRICULAR ASSIST DEVICE N/A 10/07/2021   Procedure: INSERTION OF RIGHT AXILLARY IMPELLA, DECANNULATION OF ECMO, AND MEDIASTINAL WASHOUT;  Surgeon: [name], MD;  Location: [place];  Service: Open Heart Surgery;  Laterality: N/A;  Open right axillary with 8 mm Hemashield Platinum Vascular graft.   REMOVAL OF IMPELLA LEFT VENTRICULAR ASSIST DEVICE Right 10/17/2021   Procedure: REMOVAL OF IMPELLA LEFT VENTRICULAR ASSIST DEVICE;  Surgeon: [name], MD;  Location: [place];  Service: Open Heart Surgery;  Laterality: Right;   STERNAL CLOSURE N/A 10/12/2021   Procedure: STERNAL CLOSURE;  Surgeon: [name], MD;  [facility];  Service: Thoracic;  Laterality: N/A;   TEE WITHOUT CARDIOVERSION N/A 09/12/2021   Procedure: TRANSESOPHAGEAL ECHOCARDIOGRAM (TEE);  Surgeon: [name], MD;  Location: [place];  Service: Open Heart Surgery;  Laterality: N/A;   TEE WITHOUT CARDIOVERSION N/A 09/29/2021   Procedure: TRANSESOPHAGEAL ECHOCARDIOGRAM (TEE);  Surgeon: Lajuana Matte, MD;  Location: [place];  Service: Open Heart Surgery;  Laterality: N/A;   TEE WITHOUT CARDIOVERSION N/A 10/03/2021   Procedure: TRANSESOPHAGEAL ECHOCARDIOGRAM (TEE);  Surgeon: Dahlia Byes, MD;  Location: [place];  Service: Thoracic;  Laterality: N/A;   TEE WITHOUT CARDIOVERSION  10/07/2021   Procedure: TRANSESOPHAGEAL ECHOCARDIOGRAM (TEE);  Surgeon: [name], MD;  Location: [facility] OR;  Service: Open Heart Surgery;;   TEE WITHOUT CARDIOVERSION N/A 10/12/2021   Procedure: TRANSESOPHAGEAL ECHOCARDIOGRAM (TEE);  Surgeon: [name], MD;  [facility] OR;  Service: Thoracic;  Laterality: N/A;   TEE WITHOUT CARDIOVERSION N/A 10/17/2021   Procedure: TRANSESOPHAGEAL ECHOCARDIOGRAM (TEE);   Surgeon: [name], MD;  Location: [place];  Service: Open Heart Surgery;  Laterality: N/A;   TESTICLE SURGERY  1970   pt states testicle was ascended and had to be "pulled down"   TONSILLECTOMY     as a child   HPI:  61 yo male admitted 8/19 with SOB, bradycardia and hypotension. Pt with bil pleural effusion and pericardial effusion s/p VATS with pericardial window 8/19. 8/20 cardiac tamponade with arrest,  intubated with bedside mediastinal exploration and ECMO via fem access. OR 8/21 for reexploration due to mediastinal hemorrhage. 8/28 OR for washout with ECMO decannulation and Impella placed. 8/29 brief PEA arrest with DCCV to NSR. 9/2 chest closure. 9/7 trach and Impella removed.9/11 VAC removed. PMHx: CAD s/p recent CABG x5 (admitted  8/3-11/23 with post op Afib), CKD stage II, DM, HTN, AKI, Afib and CVA    Assessment / Plan / Recommendation  Clinical Impression  Pt has his PMV in place with voice subjectively stronger than last session as evidenced by increased duration of phonation and a little more volume. His voice is still a little soft and definitely still dysphonic. Volitional cough appears to be weak. Pt seems to have appropriate oral preparation of ice chips and consistently initiates a swallow  without overt signs of aspiration, but he remains at heightened risk for dysphagia with possible silent aspiration in the setting of prolonged intubation, new trach, s/p cardiac procedures, deconditioning, and altered mentation. Would proceed to instrumental swallow study to better evaluate function. SLP Visit Diagnosis: Dysphagia, unspecified (R13.10)    Aspiration Risk  Moderate aspiration risk    Diet Recommendation NPO (could consider ice chips given sparingly by RN after oral care if MD in agreement)   Medication Administration: Via alternative means    Other  Recommendations Oral Care Recommendations: Oral care QID    Recommendations for follow up therapy are one component of a  multi-disciplinary discharge planning process, led by the attending physician.  Recommendations may be updated based on patient status, additional functional criteria and insurance authorization.  Follow up Recommendations SLP at Long-term acute care hospital      Assistance Recommended at Discharge Frequent or constant Supervision/Assistance  Functional Status Assessment Patient has had a recent decline in their functional status and demonstrates the ability to make significant improvements in function in a reasonable and predictable amount of time.  Frequency and Duration min 2x/week  2 weeks       Prognosis Prognosis for Safe Diet Advancement: Good Barriers to Reach Goals: Cognitive deficits      Swallow Study   General [name], bradycardia and hypotension. Pt with bil pleural effusion and pericardial effusion s/p VATS with pericardial window 8/19. 8/20 cardiac tamponade with arrest,  intubated with bedside mediastinal exploration and ECMO via fem access. OR 8/21 for reexploration due to mediastinal hemorrhage. 8/28 OR for washout with ECMO decannulation and Impella placed. 8/29 brief PEA arrest with DCCV to NSR. 9/2 chest closure. 9/7 trach and Impella removed.9/11 VAC removed. PMHx: CAD s/p recent CABG x5 (admitted  8/3-11/23 with post op Afib), CKD stage II, DM, HTN, AKI, Afib and CVA Type of Study: Bedside Swallow Evaluation Previous Swallow Assessment: none in cahrt Diet Prior to this Study: NPO Temperature Spikes Noted: No Respiratory Status: Trach;Trach Collar Trach Size and Type: Uncuffed;#6;With PMSV in place History of Recent Intubation: Yes Length of Intubations (days): 18 days Date extubated:  (trach 9/7) Behavior/Cognition: Alert;Cooperative;Requires cueing Oral Cavity Assessment: Within Functional Limits Oral Care Completed by SLP: Recent completion by staff Oral Cavity - Dentition: Adequate natural dentition Vision: Functional for  self-feeding Self-Feeding Abilities: Total assist Patient Positioning: Upright in bed Baseline Vocal Quality: Hoarse;Low vocal intensity Volitional Cough: Weak Volitional Swallow: Able to elicit    Oral/Motor/Sensory Function Overall Oral Motor/Sensory Function: Other (comment) (symetric but with limited lingual protrusion - question impact of mentation? was able to spontaneously get his tongue as far as to his lips but could not get him to stick it out even that far with cues)   Ice Chips Ice chips: Within functional limits Presentation: Spoon   Thin Liquid Thin Liquid: Not tested    Nectar Thick Nectar Thick Liquid: Not tested   Honey Thick Honey Thick Liquid: Not tested   Puree Puree: Not tested   Solid     Solid: Not tested      Osie Bond., M.A. [place] Office [phone]  Secure chat preferred  10/31/2021,10:45 AM

## 2021-10-31 NOTE — Procedures (Addendum)
HD Note:  Some information was entered later than the data was gathered due to patient care needs. The stated time with the data is accurate.     HD started using the R IJ HD catheter.  The catheter was no sufficiently patent to allow for treatment.  The cartridge had to be changed twice for air in the line resulting in approximately 667ml of blood to be wasted. Flushing was successful with normal saline, did not change the outcome of the pressures with atrial pull. The lines were switched with no success.  The catheter lines were corkscrewed and this was remedied with no change in the pressure issues preventing the treatment to move forward without air in the line.  The catheter has cathflow dwelling and it will be removed when the next treatment starts later today.  The patient is restless and BP soft.  Meds were given, see MAR.

## 2021-10-31 NOTE — Progress Notes (Signed)
Speech Language Pathology Treatment: [name] Speaking valve  Patient Details Name: [name] MRN: [id number] DOB: 01/19/1961 Today's Date: 10/31/2021 Time: 2641-5830 SLP Time Calculation (min) (ACUTE ONLY): 8 min  Assessment / Plan / Recommendation Clinical Impression  Pt now has a cuffless #6 Shiley and PMV was donned upon SLP arrival. Per RN, had been in place for 3+ hours already this morning and pt appeared to be tolerating this well. No evidence of back pressure when checked by SLP. Pt's vocal quality is gradually improving, although he still benefits from cues to increase volume to facilitate intelligibility. Recommend continuing to wear PMV during waking hours as tolerated.    HPI [name], bradycardia and hypotension. Pt with bil pleural effusion and pericardial effusion s/p VATS with pericardial window 8/19. 8/20 cardiac tamponade with arrest,  intubated with bedside mediastinal exploration and ECMO via fem access. OR 8/21 for reexploration due to mediastinal hemorrhage. 8/28 OR for washout with ECMO decannulation and Impella placed. 8/29 brief PEA arrest with DCCV to NSR. 9/2 chest closure. 9/7 trach and Impella removed.9/11 VAC removed. PMHx: CAD s/p recent CABG x5 (admitted  8/3-11/23 with post op Afib), CKD stage II, DM, HTN, AKI, Afib and CVA      SLP Plan  Continue with current plan of care      Recommendations for follow up therapy are one component of a multi-disciplinary discharge planning process, led by the attending physician.  Recommendations may be updated based on patient status, additional functional criteria and insurance authorization.    Recommendations  Medication Administration: Via alternative means      Patient may use Passy-Muir Speech Valve: During all therapies with supervision;During all waking hours (remove during sleep) PMSV Supervision: Intermittent         Oral Care Recommendations: Oral care QID Follow Up  Recommendations: SLP at Long-term acute care hospital Assistance recommended at discharge: Frequent or constant Supervision/Assistance SLP Visit Diagnosis: Aphonia (R49.1) Plan: Continue with current plan of care           Osie Bond., M.A. [place] Office [phone]  Secure chat preferred   10/31/2021, 10:47 AM

## 2021-10-31 NOTE — Progress Notes (Signed)
Subjective:  no major events, slightly confused/agitated and did have trach downsized yesterday -  no UOP and labs worsening but still off pressors-  plan is for IHD today - here this AM to do   Objective Vital signs in last 24 hours: Vitals:   10/31/21 0322 10/31/21 0400 10/31/21 0500 10/31/21 0600  BP: (!) 94/55 (!) 102/53 (!) 106/53 (!) 113/55  Pulse: 81 80 83 81  Resp: (!) 22 (!) 21 20 (!) 21  Temp:      TempSrc:      SpO2: 100% 100% 100% 100%  Weight:    88.4 kg  Height:       Weight change: 3.5 kg  Intake/Output Summary (Last 24 hours) at 10/31/2021 0650 Last data filed at 10/30/2021 2000 Gross per 24 hour  Intake 875 ml  Output 360 ml  Net 515 ml   Assessment/Plan:    Pt is a 61 y.o. yo male  with HTN, HLD, DM, A-fib, stroke, CKD, CAD status post CABG on 8/3 presented with SOB, found cardiac tamponade, course complicated by cardiac arrest, use of ECMO, seen as a consultation for the evaluation of AKI    #Acute kidney injury on CKD IIIa.  Ischemic ATN due to cardiogenic shock/cardiac arrest.  Started CRRT on 8/28 for decreasing urine output and fluid/volume management.  -  stopped CRRT on 9/19-  plan for IHD since now off pressors today   Appreciate IR input-  following for an opportunity to place [facility]    #Cardiac tamponade/hemorrhagic pericarditis status post pericardial drain placement with subsequent ECMO placement.  decannulated  with placement of Impella.  S/p closure of the sternum and mediastinal washout on 9/2.   #Cardiogenic shock/cardiac arrest: Was on ECMO. Continue pressors per primary team.  Impella out on 9/7- remarkable improvement    #Acute hypoxic respiratory failure:  improving - now on TC.  optimizing volume with CRRT -  euvolemic at present    #Acute blood loss anemia: Monitor hemoglobin and transfuse as needed by ICU team.  Aranesp   Increased dose to 100 mcg weekly       Louis Meckel    Labs: Basic Metabolic Panel: Recent Labs  Lab  10/30/21 0414 10/30/21 1636 10/31/21 0506  NA 143 143 144  K 4.5 4.4 5.2*  CL 106 108 105  CO2 27 19* 20*  GLUCOSE 131* 192* 110*  BUN 54* 75* 98*  CREATININE 3.50* 4.62* 5.75*  CALCIUM 9.5 9.2 9.9  PHOS 4.6 5.6* 6.8*   Liver Function Tests: Recent Labs  Lab 10/30/21 0414 10/30/21 1636 10/31/21 0506  ALBUMIN 2.3* 2.3* 2.4*   No results for input(s): "LIPASE", "AMYLASE" in the last 168 hours. No results for input(s): "AMMONIA" in the last 168 hours. CBC: Recent Labs  Lab 10/27/21 0428 10/28/21 0415 10/28/21 1556 10/29/21 0441 10/30/21 0824 10/31/21 0506  WBC 22.9* 17.2*  --  23.2* 26.5* 25.5*  HGB 8.1* 7.6*   < > 8.2* 8.1* 8.0*  HCT 26.5* 25.8*   < > 28.4* 27.8* 27.4*  MCV 96.0 98.1  --  100.4* 101.8* 100.0  PLT 451* 345  --  350 358 390   < > = values in this interval not displayed.   Cardiac Enzymes: No results for input(s): "CKTOTAL", "CKMB", "CKMBINDEX", "TROPONINI" in the last 168 hours.  CBG: Recent Labs  Lab 10/30/21 1209 10/30/21 1611 10/30/21 2107 10/30/21 2318 10/31/21 0519  GLUCAP 111* 167* 98 102* 111*    Iron Studies: No  results for input(s): "IRON", "TIBC", "TRANSFERRIN", "FERRITIN" in the last 72 hours. Studies/Results: DG Abd Portable 1V  Result Date: 10/29/2021 CLINICAL DATA:  Feeding tube placement EXAM: PORTABLE ABDOMEN - 1 VIEW COMPARISON:  Previous studies including the examination done earlier today FINDINGS: There is interval change in location of tip of feeding tube which is noted in the course of proximal jejunum. There is NG tube with its tip in the stomach. Bowel gas pattern is nonspecific. IMPRESSION: Tip of feeding tube is seen in the proximal course of jejunum. Electronically Signed   By: Elmer Picker M.D.   On: 10/29/2021 14:42   Medications: Infusions:  sodium chloride 10 mL/hr at 10/29/21 1900   sodium chloride     albumin human Stopped (10/15/21 1901)   anticoagulant sodium citrate     feeding supplement (VITAL  1.5 CAL) Stopped (10/30/21 1801)   micafungin (MYCAMINE) 100 mg in sodium chloride 0.9 % 100 mL IVPB Stopped (10/30/21 1203)   norepinephrine (LEVOPHED) Adult infusion Stopped (10/29/21 1923)    Scheduled Medications:  sodium chloride   Intravenous Once   acetaminophen (TYLENOL) oral liquid 160 mg/5 mL  650 mg Per Tube Q6H   ascorbic acid  250 mg Per Tube BID   aspirin  81 mg Per Tube Daily   Chlorhexidine Gluconate Cloth  6 each Topical Daily   Chlorhexidine Gluconate Cloth  6 each Topical Q0600   collagenase   Topical Daily   darbepoetin (ARANESP) injection - NON-DIALYSIS  100 mcg Subcutaneous Q Mon-1800   feeding supplement (PROSource TF20)  60 mL Per Tube TID   fiber  1 packet Per Tube BID   folic acid  1 mg Per Tube Daily   heparin injection (subcutaneous)  5,000 Units Subcutaneous Q8H   heparin sodium (porcine)       insulin aspart  0-20 Units Subcutaneous Q4H   insulin aspart  2 Units Subcutaneous Q4H   insulin glargine-yfgn  10 Units Subcutaneous QHS   lipase/protease/amylase)  20,880 Units Per Tube Once   And   sodium bicarbonate  650 mg Per Tube Once   midodrine  15 mg Per Tube TID WC   multivitamin  1 tablet Per Tube QHS   nutrition supplement (JUVEN)  1 packet Per Tube BID BM   mouth rinse  15 mL Mouth Rinse 4 times per day   oxyCODONE  10 mg Per Tube Q6H   Followed by   Derrill Memo ON 11/01/2021] oxyCODONE  5 mg Per Tube Q6H   pantoprazole  40 mg Per Tube BID   QUEtiapine  50 mg Per Tube QHS   sodium chloride flush  10-40 mL Intracatheter Q12H   sodium chloride flush  10-40 mL Intracatheter Q12H   sodium chloride flush  3 mL Intravenous Q12H   thiamine  100 mg Per Tube Daily   zinc sulfate  220 mg Per Tube Daily    have reviewed scheduled and prn medications.  Physical Exam: General: more awake on TC this AM-  begging for something to drink Heart: RRR Lungs: mostly clear Abdomen: slightly distended Extremities: min edema Dialysis Access: right IJ dialysis cath-   35 days old     10/31/2021,6:50 AM  LOS: 33 days

## 2021-10-31 NOTE — Procedures (Signed)
Cortrak  Person Inserting Tube:  Ranell Patrick D, RD Tube Type:  Cortrak - 43 inches Tube Size:  10 Tube Location:  Left nare Secured by: Bridle Technique Used to Measure Tube Placement:  Marking at nare/corner of mouth Cortrak Secured At:  94 cm  Cortrak Tube Team Note:  Consult received to place a Cortrak feeding tube.   X-ray is required, abdominal x-ray has been ordered by the Cortrak team. Please confirm tube placement before using the Cortrak tube.   If the tube becomes dislodged please keep the tube and contact the Cortrak team at [email or web address] (password [email or web address]) for replacement.  If after hours and replacement cannot be delayed, place a NG tube and confirm placement with an abdominal x-ray.   Ranell Patrick, RD, LDN Clinical Dietitian RD pager # available in [place]  After hours/weekend pager # available in [facility]

## 2021-10-31 NOTE — Progress Notes (Signed)
CT surgery PM rounds  No major events today Dialysis catheter treated with tPA for partial occlusion Core track feeding tube placed to the distal antrum and tube feeds are resumed  Blood pressure 101/65, pulse 89, temperature 98.1 F (36.7 C), temperature source Oral, resp. rate (!) 27, height 5\' 7"  (1.702 m), weight 88.4 kg, SpO2 99 %.

## 2021-10-31 NOTE — TOC CM/SW Note (Addendum)
HF TOC received message from [facility] they are researching his coverage with COBRA through [place].   HF TOC CM received notification from [place] that pt does have COBRA, but does not have information for plan. TOC CM contacted pt's wife, [name] and she is willing to step in and assist patient with financial. Spoke to [place], [name] and pt will be able to move back in with him if he can care for himself after dc. If pt has [place] with [employer], will be able to arrange LTAC, IP rehab or SNF rehab. Wife plans to come next week to meet with Uncle in patient room to get all necessary information to pay his bill, eg. car payment, insurance. She will assist with completing his SS disability. Pt should be also receiving disability with his job. Updated attending. Uncle to bring pt's mail when he comes next week. [name] RN3 CCM, Heart Failure TOC CM [phone]   HF TOC CM received referral for LTAC, contacted [facility] liaison, Anderson [place]. Pt without insurance coverage are not eligible for LTAC. Medicaid does not have an LTAC benefit. Spoke to pt's ex-wife and states she believes pt did apply for disability. Will follow up with Financial Counselor/[employer] on Medicaid application. [place], Heart Failure TOC CM [phone]

## 2021-10-31 NOTE — Procedures (Signed)
HD Note:  Some information was entered later than the data was gathered due to patient care needs.   The stated time with the data is accurate.   The treatment was restarted at 1738.  The atrial lumen has a kink in it that occludes when used to pull and is now working well with the lines reversed.  The patient is doing his best to remain more still and support is being offered.  He has responded well to the repositioning and was given some pain medicine, see MAR.

## 2021-10-31 NOTE — Progress Notes (Signed)
NAME:  [name], MRN:  [id number], DOB:  [birth year], LOS: 33 ADMISSION DATE:  09/28/2021, CONSULTATION DATE:  09/29/2021 REFERRING MD:  Kipp Brood - TCTS CHIEF COMPLAINT: Dyspnea   History of Present Illness:  61 year old man who underwent CABG on 8/3, readmitted on 8/19 in setting of dyspnea with a pericardial effusion requiring pericardial window via bedside sternotomy. Also with bilateral pleural effusions. Sustained PEA arrest on 8/20, briefly required [facility] ECMO which was discontinued on 8/28, Impella placed 8/28, started on CRRT.  Hypoxemia, repeat cardiac arrest on 8/30. Underwent mediastinal washout/sternotomy closure 9/2. Impella removed and tracheostomy completed 9/7.  Pertinent Medical History:  CAD s/p CABG 8/3 DM2 Diverticulosis GERD Hyperlipidemia Hypertension History of stroke Tobacco Abuse  Significant Hospital Events: Including procedures, antibiotic start and stop dates in addition to other pertinent events   8/19 Admitted, s/p pericardial window 8/20 PEA cardiac arrest, left pigtail chest tube placement, PEA cardiac arrest due to tamponade, bedside sternotomy with pericardial drains placed. 8/21 Return to OR for overnight bleeding.  8/22 Minimal chest tube output.  Atrial fibrillation, controled with amiodarone.  8/22 TEE showed EF 35% at baseline, which improved significantly with decreasing ECMO flow.  8/23 Tolerated diuresis  8/24 Mediastinal washout, removal of hematoma 8/25 Ongoing bleeding issues from posterior mediastinum 8/28 Decannulated and off ECMO, 5.5 Impella placed, L femoral HD cath placed and started on CRRT 8/30 Hypoxia and hypotension followed by brief arrest, gentle L lateral chest compressions performed and ROSC in roughly 1 minute. 8/31 Remains deeply sedated on ventilator with open chest.  No acute events overnight. 9/1 No acute events overnight, remains on ventilator deep sedation.  Tolerating aggressive volume removal per CRRT 9/2 Sternotomy  closure 9/3 Wean versed 9/4 FOB, low grade temps, notable thick mucus/frequent suctioning 9/5 Vomiting, ?mucus plugging. PSV 5-6 hours on 10/5 9/6 Impella down to p3 9/7 Impella removal and trach. PRBC and DDAVP in OR  9/8 Low dose epi, weaned on 8/8 9/9 RIJ HD catheter placed 9/11 Started on ATC at 28% and tolerating well 9/12 Remains on 28% ATC 9/13 SB/Junction rhythm with associated hypotension overnight requiring transient pressor escalation. Pressor requirement down in AM. WBC count 23 (18), Co-ox 36%. Resp/Blood Cx. 9/14 Mild bradycardia/relative hypotension with Precedex. Remains on Epi 13mcg, Vaso 0.03. Resp Cx with few gram+ cocci (clusters), yeast. BCx NGTD. 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 Co-ox 48%. CRRT stopped. 9/15 WBC continues to downtrend. Hgb 7.6. Net +1.49L/24H. CRRT resumed. Co-ox 51%. NE started to help with Epi wean. 9/16, Co. oximetry improved, WBC trending down, weaning pressors 9/19 on trach collar for 96 hours.  Weaning off vasopressors.  Trial off CRRT today.  Episode of overnight emesis. 9/20 more awake. CRRT off  Interim History / Subjective:   Downsized to cuffless trach. On IHD today. Very agitated.  Objective:  Blood pressure (!) 95/50, pulse 81, temperature 98 F (36.7 C), temperature source Oral, resp. rate 16, height 5\' 7"  (1.702 m), weight 88.4 kg, SpO2 100 %. CVP:  [1 mmHg-17 mmHg] 8 mmHg  FiO2 (%):  [28 %] 28 %   Intake/Output Summary (Last 24 hours) at 10/31/2021 1324 Last data filed at 10/30/2021 2000 Gross per 24 hour  Intake 755 ml  Output 360 ml  Net 395 ml    Filed Weights   10/29/21 0500 10/30/21 0630 10/31/21 0600  Weight: 89.9 kg 84.9 kg 88.4 kg   Physical Examination: General: Acutely ill-appearing middle-aged man in NAD. Appears uncomfortable. HEENT:  [place]/AT, anicteric sclera, PERRL, moist mucous membranes. Trach in place. PMV in place Neuro:  Awake, able to speak clearly with PMV.   Responds to verbal stimuli. Following commands intermittently. Moves all 4 extremities spontaneously. CV: RRR, no m/g/r. PULM: Mildly tachypneic, breathing unlabored on ATC (5L, FiO2 28%). Lung fields clear.  GI: Soft, nontender, nondistended. Hypoactive bowel sounds. Extremities: Bilateral symmetric 1+ LE edema noted. Skin: Warm/dry, no rashes.  Resolved Hospital Problem List:   Acute RV failure > resolved  Assessment & Plan:   Acute metabolic encephalopathy -Continue to wean all sedatives off: Oxycodone taper in place, Seroquel nightly only, Resume ATC low-dose clonazepam  Hemorrhagic pericarditis with tamponade PEA arrest due to tamponade, s/p emergent sternotomy Cardiogenic & hemorrhagic shock, s/p VA ECMO (decannulated 8/29), s/p Impella (removed 9/7) -Continue to wean norepinephrine to off.  CAD s/p CABG x3 Afib  -Presently rate controlled  Leukocytosis without signs of sepsis Completed. course of daptomycin, completed course of meropenem today. -Finish off 14-day course of micafungin as patient at high risk for fungemia.  Acute respiratory failure with hypoxemia and hypercarbia S/p Tracheostomy  -Continue trach collar trial. -Passy-Muir valve. - Downsize to cuffless tracheostomy.  AKI with anuria  Hypophosphatemia -Transition to IHD. -After discussion with TTTS nephrology, okay for PICC placement, ordered 10/27/2021 -Permcath placement once leukocytosis improving.    DM II with hyperglycemia -Presently well controlled  At Risk Malnutrition  - TF via Cortrak -Swallow evaluation.   Sacral pressure wound - WOC following  Severe physical deconditioning -Continue physical therapy.  Best Practice (right click and "Reselect all SmartList Selections" daily)  Diet/type: tubefeeds  DVT prophylaxis: prophylactic heparin  GI prophylaxis: PPI Lines: Central line, Dialysis Catheter, and Arterial Line Foley:  N/A Code Status:  full code Last date of  multidisciplinary goals of care discussion          Kipp Brood, MD [facility] 10/31/21 8:22 AM  Please see [email or web address] for contact info details.  From 7A-7P if no response, please call [phone] After hours, please call ELink [phone]

## 2021-11-01 LAB — RENAL FUNCTION PANEL
Albumin: 2.6 g/dL — ABNORMAL LOW (ref 3.5–5.0)
Anion gap: 17 — ABNORMAL HIGH (ref 5–15)
BUN: 65 mg/dL — ABNORMAL HIGH (ref 8–23)
CO2: 25 mmol/L (ref 22–32)
Calcium: 9.2 mg/dL (ref 8.9–10.3)
Chloride: 94 mmol/L — ABNORMAL LOW (ref 98–111)
Creatinine, Ser: 3.44 mg/dL — ABNORMAL HIGH (ref 0.61–1.24)
GFR, Estimated: 19 mL/min — ABNORMAL LOW (ref >60)
Glucose, Bld: 107 mg/dL — ABNORMAL HIGH (ref 70–99)
Phosphorus: 4.4 mg/dL (ref 2.5–4.6)
Potassium: 3.8 mmol/L (ref 3.5–5.1)
Sodium: 136 mmol/L (ref 135–145)

## 2021-11-01 LAB — CBC
HCT: 24.9 % — ABNORMAL LOW (ref 39.0–52.0)
Hemoglobin: 7.5 g/dL — ABNORMAL LOW (ref 13.0–17.0)
MCH: 29.6 pg (ref 26.0–34.0)
MCHC: 30.1 g/dL (ref 30.0–36.0)
MCV: 98.4 fL (ref 80.0–100.0)
Platelets: 426 10*3/uL — ABNORMAL HIGH (ref 150–400)
RBC: 2.53 MIL/uL — ABNORMAL LOW (ref 4.22–5.81)
RDW: 23 % — ABNORMAL HIGH (ref 11.5–15.5)
WBC: 24.1 10*3/uL — ABNORMAL HIGH (ref 4.0–10.5)
nRBC: 3.4 % — ABNORMAL HIGH (ref 0.0–0.2)

## 2021-11-01 LAB — COOXEMETRY PANEL
Carboxyhemoglobin: 2.2 % — ABNORMAL HIGH (ref 0.5–1.5)
Methemoglobin: 1.5 % (ref 0.0–1.5)
O2 Saturation: 63 %
Total hemoglobin: 7.9 g/dL — ABNORMAL LOW (ref 12.0–16.0)

## 2021-11-01 LAB — GLUCOSE, CAPILLARY
Glucose-Capillary: 111 mg/dL — ABNORMAL HIGH (ref 70–99)
Glucose-Capillary: 149 mg/dL — ABNORMAL HIGH (ref 70–99)
Glucose-Capillary: 150 mg/dL — ABNORMAL HIGH (ref 70–99)
Glucose-Capillary: 151 mg/dL — ABNORMAL HIGH (ref 70–99)
Glucose-Capillary: 164 mg/dL — ABNORMAL HIGH (ref 70–99)
Glucose-Capillary: 166 mg/dL — ABNORMAL HIGH (ref 70–99)
Glucose-Capillary: 168 mg/dL — ABNORMAL HIGH (ref 70–99)

## 2021-11-01 LAB — MAGNESIUM: Magnesium: 2 mg/dL (ref 1.7–2.4)

## 2021-11-01 MED ORDER — ACETAMINOPHEN 10 MG/ML IV SOLN
1000.0000 mg | Freq: Once | INTRAVENOUS | Status: AC
  Administered 2021-11-01: 1000 mg via INTRAVENOUS
  Filled 2021-11-01: qty 100

## 2021-11-01 MED ORDER — CHLORHEXIDINE GLUCONATE CLOTH 2 % EX PADS: 6.0000 | MEDICATED_PAD | Freq: Every day | CUTANEOUS | Status: DC

## 2021-11-01 NOTE — Progress Notes (Signed)
15 Days Post-Op Procedure(s) (LRB): REMOVAL OF IMPELLA LEFT VENTRICULAR ASSIST DEVICE (Right) TRANSESOPHAGEAL ECHOCARDIOGRAM (TEE) (N/A) PERCUTANEOUS TRACHEOSTOMY USING SHILEY FLEXIBLE 8 mm CUFFED TRACH. (N/A) Subjective:  Resting in bed. Worn out from working with PT and OT today. Was sitting up in chair today. Using PM valve.  Objective: Vital signs in last 24 hours: Temp:  [97.2 F (36.2 C)-99.4 F (37.4 C)] 98.9 F (37.2 C) (09/22 1600) Pulse Rate:  [80-95] 83 (09/22 1500) Cardiac Rhythm: Normal sinus rhythm (09/22 0800) Resp:  [13-33] 25 (09/22 1500) BP: (89-136)/(42-97) 105/54 (09/22 1500) SpO2:  [96 %-100 %] 100 % (09/22 1500) FiO2 (%):  [28 %] 28 % (09/22 1500) Weight:  [84.1 kg-86.4 kg] 84.1 kg (09/22 0500)  Hemodynamic parameters for last 24 hours: CVP:  [0 mmHg-11 mmHg] 11 mmHg  Intake/Output from previous day: 09/21 0701 - 09/22 0700 In: 516.5 [I.V.:3; NG/GT:408.5; IV Piggyback:105] Out: 2500 [Stool:500] Intake/Output this shift: Total I/O In: 857 [NG/GT:651.8; IV Piggyback:205.2] Out: 200 [Stool:200]  General appearance: sleepy now but was alert when I saw him earlier today. Neurologic: unable to assess at this time. Heart: regular rate and rhythm, S1, S2 normal, no murmur Lungs: clear to auscultation bilaterally Abdomen: soft, non-tender; bowel sounds normal Extremities: extremities normal, atraumatic, no cyanosis or edema Wound: A few of the retention sutures were removed from the lower end of the sternal wound since the wound had separated and there was some soupy subcutaneous fat present. The upper half of the incision is intact.  Lab Results: Recent Labs    10/31/21 0506 11/01/21 0344  WBC 25.5* 24.1*  HGB 8.0* 7.5*  HCT 27.4* 24.9*  PLT 390 426*   BMET:  Recent Labs    10/31/21 1624 11/01/21 0344  NA 141 136  K 5.6* 3.8  CL 109 94*  CO2 19* 25  GLUCOSE 121* 107*  BUN 115* 65*  CREATININE 6.21* 3.44*  CALCIUM 9.0 9.2    PT/INR: No  results for input(s): "LABPROT", "INR" in the last 72 hours. ABG    Component Value Date/Time   PHART 7.301 (L) 10/22/2021 2354   HCO3 19.3 (L) 10/22/2021 2354   TCO2 20 (L) 10/22/2021 2354   ACIDBASEDEF 7.0 (H) 10/22/2021 2354   O2SAT 63 11/01/2021 0344   CBG (last 3)  Recent Labs    11/01/21 0823 11/01/21 1112 11/01/21 1558  GLUCAP 149* 150* 166*    Assessment/Plan: S/P Procedure(s) (LRB): REMOVAL OF IMPELLA LEFT VENTRICULAR ASSIST DEVICE (Right) TRANSESOPHAGEAL ECHOCARDIOGRAM (TEE) (N/A) PERCUTANEOUS TRACHEOSTOMY USING SHILEY FLEXIBLE 8 mm CUFFED TRACH. (N/A)  Hemodynamically stable on midodrine alone. Co-ox 63%.  Tolerated HD yesterday without difficulty. Plan HD tomorrow.  Persistent leukocytosis of unclear etiology. Plan to remove right neck line after HD tomorrow and have a perm cath on Monday if leukocytosis improves.  Tolerating TF at goal.  Start wet to dry NS dressings to lower part of sternal wound daily.      LOS: 34 days    [name] 11/01/2021

## 2021-11-01 NOTE — Progress Notes (Signed)
Nutrition Follow-up  DOCUMENTATION CODES:   Not applicable  INTERVENTION:   Tube Feeding via Cortrak (gastric): Vital 1.5 at 60 ml/hr Pro-source TF20 60 mL TID This provides 2400 kcals, 157 g of protein and 1094 mL of free water  Continue Juven BID, each packet provides 80 calories, 8 grams of carbohydrate, 2.5  grams of protein (collagen), 7 grams of L-arginine and 7 grams of L-glutamine; supplement contains CaHMB, Vitamins C, E, B12 and Zinc to promote wound healing  Continue Vitamin C, Zinc, Folic Acid, Renal MVI supplementation   NUTRITION DIAGNOSIS:   Inadequate oral intake related to acute illness as evidenced by NPO status.  Being addressed via TF   GOAL:   Patient will meet greater than or equal to 90% of their needs  Progressing  MONITOR:   Vent status, Labs, Weight trends, TF tolerance  REASON FOR ASSESSMENT:   Ventilator    ASSESSMENT:   61 yo male admitted with acute on chronic CHF with recent CABG, symptomatic bradycardia and hypotension. PMH includes DM, CAD s/p recent CABG x 5, CKD 3, CVA  9/20 Trach downsized 9/21 Transitioned to iHD  Pt out of bed to chair today with PT  Tolerated iHD yesterday, plan for iHD again tomorrow. Noted possible perm cath on Monday  Cortrak become clogged and replaced yesterday. Cortrak tip now gastric per discussion with PCCM. NG tube was also removed yesterday  SLP evaluated at bedside today, FEES performed and only able to trial ice chips. Pt not ready for diet advancement at this time.   Cortrak clogged yesterday and had to be replaced. NG now out.   Plan for MBS today.   Renal labs improved significantly with iHD yesterday. Phosphorus and potassium wdl Do NOT recommend changing to Nepro formula at this time as pt has experienced significant issues with TF tolerance this admission, worsened with previous switch to Nepro. Cortrak now gastric and hope that pt will tolerate titration to goal with this  Stool  leaking from around Rectal tube. Pt receiving nutrisource fiber. Banatrol held due to allergy?.   Labs: reviewed Meds: reviewed   Diet Order:   Diet Order             Diet NPO time specified  Diet effective midnight                   EDUCATION NEEDS:   Not appropriate for education at this time  Skin:  Skin Assessment: Skin Integrity Issues: Skin Integrity Issues:: Unstageable Stage II: R upper buttock, L ischium Unstageable: L upper  buttock, gluteal cleft/distal sacrum Incisions: chest (closed, wound VAC removed) Other: sheer/friction to right scapula region  Last BM:  400 ml via rectal tube  Height:   Ht Readings from Last 1 Encounters:  10/23/21 5\' 7"  (1.702 m)    Weight:   Wt Readings from Last 1 Encounters:  11/01/21 84.1 kg     BMI:  Body mass index is 29.04 kg/m.  Estimated Nutritional Needs:   Kcal:  2200-2400 kcals  Protein:  135-165 g  Fluid:  1.8 L  Kerman Passey MS, RDN, LDN, CNSC Registered Dietitian 3 Clinical Nutrition RD Pager and On-Call Pager Number Located in [place]

## 2021-11-01 NOTE — Plan of Care (Signed)
Problem: Education: Goal: Will demonstrate proper wound care and an understanding of methods to prevent future damage Outcome: Progressing Goal: Knowledge of disease or condition will improve Outcome: Progressing Goal: Knowledge of the prescribed therapeutic regimen will improve Outcome: Progressing Goal: Individualized Educational Video(s) Outcome: Progressing   Problem: Activity: Goal: Risk for activity intolerance will decrease Outcome: Progressing   Problem: Cardiac: Goal: Will achieve and/or maintain hemodynamic stability Outcome: Progressing   Problem: Clinical Measurements: Goal: Postoperative complications will be avoided or minimized Outcome: Progressing   Problem: Respiratory: Goal: Respiratory status will improve Outcome: Progressing   Problem: Skin Integrity: Goal: Wound healing without signs and symptoms of infection Outcome: Progressing Goal: Risk for impaired skin integrity will decrease Outcome: Progressing   Problem: Urinary Elimination: Goal: Ability to achieve and maintain adequate renal perfusion and functioning will improve Outcome: Progressing   Problem: Education: Goal: Understanding of cardiac disease, CV risk reduction, and recovery process will improve Outcome: Progressing Goal: Individualized Educational Video(s) Outcome: Progressing   Problem: Activity: Goal: Ability to tolerate increased activity will improve Outcome: Progressing   Problem: Cardiac: Goal: Ability to achieve and maintain adequate cardiovascular perfusion will improve Outcome: Progressing   Problem: Health Behavior/Discharge Planning: Goal: Ability to safely manage health-related needs after discharge will improve Outcome: Progressing   Problem: Education: Goal: Knowledge of General Education information will improve Description: Including pain rating scale, medication(s)/side effects and non-pharmacologic comfort measures Outcome: Progressing   Problem: Health  Behavior/Discharge Planning: Goal: Ability to manage health-related needs will improve Outcome: Progressing   Problem: Clinical Measurements: Goal: Ability to maintain clinical measurements within normal limits will improve Outcome: Progressing Goal: Will remain free from infection Outcome: Progressing Goal: Diagnostic test results will improve Outcome: Progressing Goal: Respiratory complications will improve Outcome: Progressing Goal: Cardiovascular complication will be avoided Outcome: Progressing   Problem: Activity: Goal: Risk for activity intolerance will decrease Outcome: Progressing   Problem: Nutrition: Goal: Adequate nutrition will be maintained Outcome: Progressing   Problem: Coping: Goal: Level of anxiety will decrease Outcome: Progressing   Problem: Elimination: Goal: Will not experience complications related to bowel motility Outcome: Progressing Goal: Will not experience complications related to urinary retention Outcome: Progressing   Problem: Pain Managment: Goal: General experience of comfort will improve Outcome: Progressing   Problem: Safety: Goal: Ability to remain free from injury will improve Outcome: Progressing   Problem: Skin Integrity: Goal: Risk for impaired skin integrity will decrease Outcome: Progressing   Problem: Education: Goal: Ability to describe self-care measures that may prevent or decrease complications (Diabetes Survival Skills Education) will improve Outcome: Progressing Goal: Individualized Educational Video(s) Outcome: Progressing   Problem: Coping: Goal: Ability to adjust to condition or change in health will improve Outcome: Progressing   Problem: Fluid Volume: Goal: Ability to maintain a balanced intake and output will improve Outcome: Progressing   Problem: Health Behavior/Discharge Planning: Goal: Ability to identify and utilize available resources and services will improve Outcome: Progressing Goal:  Ability to manage health-related needs will improve Outcome: Progressing   Problem: Metabolic: Goal: Ability to maintain appropriate glucose levels will improve Outcome: Progressing   Problem: Nutritional: Goal: Maintenance of adequate nutrition will improve Outcome: Progressing Goal: Progress toward achieving an optimal weight will improve Outcome: Progressing   Problem: Skin Integrity: Goal: Risk for impaired skin integrity will decrease Outcome: Progressing   Problem: Tissue Perfusion: Goal: Adequacy of tissue perfusion will improve Outcome: Progressing   Problem: Education: Goal: Knowledge about tracheostomy care/management will improve Outcome: Progressing   Problem: Activity: Goal:  Ability to tolerate increased activity will improve Outcome: Progressing

## 2021-11-01 NOTE — Progress Notes (Signed)
   10/31/21 2145  Vitals  Temp (!) 97.2 F (36.2 C)  BP 100/67  Pulse Rate 91  Resp (!) 22  Post Treatment  Dialyzer Clearance Heavily streaked  Duration of HD Treatment -hour(s) 4 hour(s)  Liters Processed 71.8  Fluid Removed 2000 mL  Tolerated HD Treatment Yes  Post-Hemodialysis Comments TX ran in reverse flow, positional with movement.   TX fin. W/o difficulty.

## 2021-11-01 NOTE — TOC CM/SW Note (Signed)
HF TOC CM received notification from [employer] that they were able to locate COBRA member ID#. And will continue to get information on his policy.   Wife and pt's Rosalee Kaufman will work together on pt's other financial needs. Spoke to wife and she will be able to travel from [facility] due to medical issues. Their Cory Roughen will visit 9/23 and bring to her pt's mail and necessary information to assist with completing his disability and getting his bills paid.   HF TOC CM received call from pt's wife, [name] and she can come on Monday, Sept  25 to assist with his financial business, his cousin Shirlean Mylar will meet with [name] and they will work on Research scientist (medical).   Received message from [employer] rep, see message below,   I have spoken with Mr. Easom again today.  He says the coverage is active, but he doesn't have his card. I was able to determine that the coverage is through [employer].  It's [employer] coverage from his previous employer, [employer]. I have a group policy number, but I do not have a subscriber/member ID#.  Without the member ID number, I can not verify coverage in [place].  I called [employer], but I can't get past their automated system without his member #. Janett Billow, can you verify [employer] coverage in [place] without the member #?  He won't be approved for Medicaid without providing the insurance information.  If he has insurance, that will be considered primary coverage.  Contacted Billing and spoke to [place], she will research to see if she can locate policy for [employer]. [place], Heart Failure TOC CM [phone]

## 2021-11-01 NOTE — Procedures (Signed)
Objective Swallowing Evaluation: Type of Study: FEES-Fiberoptic Endoscopic Evaluation of Swallow   Patient Details  Name: [name] MRN: [id number] Date of Birth: August 21, 1960  Today's Date: 11/01/2021 Time: SLP Start Time (ACUTE ONLY): 1212 -SLP Stop Time (ACUTE ONLY): 5621  SLP Time Calculation (min) (ACUTE ONLY): 29 min   Past Medical History:  Past Medical History:  Diagnosis Date   AKI (acute kidney injury) ([place])    pt unaware of this   Anginal pain ([place])    CAD (coronary artery disease)    a. 03/2015 NSTEMI: LHC with severe 3V CAD  (70% mid RCA, 95% OM1, 90% distal LCx, 90% OM3, 80% prox LAD and 90% ost D1) s/p DES to mLAD w/ small dissction Rx with DES, staged ost Ramus PCI/DES and dLCx s/p PCI/DES    Chest pain 12/24/2020   Diabetes mellitus type 2 in obese [facility])    Diverticulosis    Dyspnea    Dyspnea on exertion 03/16/2015   Dyspnea on exertion   Family history of adverse reaction to anesthesia    patient father- pt states after anesthesia his father "developed dementia"   GERD (gastroesophageal reflux disease)    Hypercholesteremia    Hypertension associated with diabetes ([place]) 03/16/2015   hypertension   NSTEMI (non-ST elevated myocardial infarction) ([place]) 03/17/2015   Obesity    Stroke ([place]) 2022   pt states he had a "mini stroke" during cardiac catheterization   Tobacco abuse    Past Surgical History:  Past Surgical History:  Procedure Laterality Date   APPLICATION OF WOUND VAC N/A 10/03/2021   Procedure: APPLICATION OF WOUND VAC;  Surgeon: Dahlia Byes, MD;  Location: [place];  Service: Thoracic;  Laterality: N/A;   CANNULATION FOR ECMO (EXTRACORPOREAL MEMBRANE OXYGENATION) N/A 09/29/2021   Procedure: CANNULATION FOR ECMO (EXTRACORPOREAL MEMBRANE OXYGENATION);  Surgeon: Lajuana Matte, MD;  Location: [place];  Service: Open Heart Surgery;  Laterality: N/A;   CARDIAC CATHETERIZATION N/A 03/19/2015   Procedure: Left Heart Cath and Coronary Angiography;   Surgeon: Lorretta Harp, MD;  Location: [place] CV LAB;  Service: Cardiovascular;  Laterality: N/A;   CARDIAC CATHETERIZATION N/A 03/20/2015   Procedure: Coronary Stent Intervention;  Surgeon: Lorretta Harp, MD;  Location: [place] CV LAB;  Service: Cardiovascular;  Laterality: N/A;   CARDIAC CATHETERIZATION N/A 03/22/2015   Procedure: Coronary Stent Intervention;  Surgeon: Lorretta Harp, MD;  Location: [place] CV LAB;  Service: Cardiovascular;  Laterality: N/A;   CORONARY ARTERY BYPASS GRAFT N/A 09/12/2021   Procedure: CORONARY ARTERY BYPASS GRAFTING (CABG) X 5 USING LEFT INTERNAL MAMMARY ARTERY AND ENDOSCOPICALLY HARVESTED RIGHT GREATER SAPHENOUS VEIN.;  Surgeon: [name], MD;  Location: [place];  Service: Open Heart Surgery;  Laterality: N/A;   EXPLORATION POST OPERATIVE OPEN HEART N/A 09/30/2021   Procedure: EXPLORATION POST OPERATIVE OPEN HEART WASHOUT;  Surgeon: Lajuana Matte, MD;  Location: [place];  Service: Open Heart Surgery;  Laterality: N/A;   LEFT HEART CATH AND CORONARY ANGIOGRAPHY N/A 12/25/2020   Procedure: LEFT HEART CATH AND CORONARY ANGIOGRAPHY;  Surgeon: Troy Sine, MD;  Location: [place] CV LAB;  Service: Cardiovascular;  Laterality: N/A;   LEFT HEART CATH AND CORONARY ANGIOGRAPHY N/A 12/26/2020   Procedure: LEFT HEART CATH AND CORONARY ANGIOGRAPHY;  Surgeon: Troy Sine, MD;  Location: [place] CV LAB;  Service: Cardiovascular;  Laterality: N/A;   MEDIASTINAL EXPLORATION N/A 10/03/2021   Procedure: MEDIASTINAL WASHOUT;  Surgeon: Dahlia Byes, MD;  [facility];  Service: Thoracic;  Laterality: N/A;  PUMP STANDBY   MEDIASTINAL EXPLORATION N/A 10/12/2021   Procedure: MEDIASTINAL WASHOUT;  Surgeon: [name], MD;  Location: [place];  Service: Thoracic;  Laterality: N/A;   PERCUTANEOUS TRACHEOSTOMY N/A 10/17/2021   Procedure: PERCUTANEOUS TRACHEOSTOMY USING SHILEY FLEXIBLE 8 mm CUFFED TRACH.;  Surgeon: Candee Furbish, MD;  Location:  [place];  Service: Pulmonary;  Laterality: N/A;  Percutaneous tracheostomy   PERICARDIAL WINDOW Right 09/28/2021   Procedure: PERICARDIAL WINDOW;  Surgeon: Lajuana Matte, MD;  Location: [place];  Service: Thoracic;  Laterality: Right;  Right VATS.  Lazy lateral.  double lumen ET tube   PLACEMENT OF IMPELLA LEFT VENTRICULAR ASSIST DEVICE N/A 10/07/2021   Procedure: INSERTION OF RIGHT AXILLARY IMPELLA, DECANNULATION OF ECMO, AND MEDIASTINAL WASHOUT;  Surgeon: [name], MD;  Location: [place];  Service: Open Heart Surgery;  Laterality: N/A;  Open right axillary with 8 mm Hemashield Platinum Vascular graft.   REMOVAL OF IMPELLA LEFT VENTRICULAR ASSIST DEVICE Right 10/17/2021   Procedure: REMOVAL OF IMPELLA LEFT VENTRICULAR ASSIST DEVICE;  Surgeon: [name], MD;  Location: [place];  Service: Open Heart Surgery;  Laterality: Right;   STERNAL CLOSURE N/A 10/12/2021   Procedure: STERNAL CLOSURE;  Surgeon: [name], MD;  [facility];  Service: Thoracic;  Laterality: N/A;   TEE WITHOUT CARDIOVERSION N/A 09/12/2021   Procedure: TRANSESOPHAGEAL ECHOCARDIOGRAM (TEE);  Surgeon: [name], MD;  Location: [place];  Service: Open Heart Surgery;  Laterality: N/A;   TEE WITHOUT CARDIOVERSION N/A 09/29/2021   Procedure: TRANSESOPHAGEAL ECHOCARDIOGRAM (TEE);  Surgeon: Lajuana Matte, MD;  Location: [place];  Service: Open Heart Surgery;  Laterality: N/A;   TEE WITHOUT CARDIOVERSION N/A 10/03/2021   Procedure: TRANSESOPHAGEAL ECHOCARDIOGRAM (TEE);  Surgeon: Dahlia Byes, MD;  Location: [place];  Service: Thoracic;  Laterality: N/A;   TEE WITHOUT CARDIOVERSION  10/07/2021   Procedure: TRANSESOPHAGEAL ECHOCARDIOGRAM (TEE);  Surgeon: [name], MD;  Location: [facility];  Service: Open Heart Surgery;;   TEE WITHOUT CARDIOVERSION N/A 10/12/2021   Procedure: TRANSESOPHAGEAL ECHOCARDIOGRAM (TEE);  Surgeon: [name], MD;  [facility] OR;  Service: Thoracic;  Laterality: N/A;   TEE WITHOUT  CARDIOVERSION N/A 10/17/2021   Procedure: TRANSESOPHAGEAL ECHOCARDIOGRAM (TEE);  Surgeon: [name], MD;  Location: [place];  Service: Open Heart Surgery;  Laterality: N/A;   TESTICLE SURGERY  1970   pt states testicle was ascended and had to be "pulled down"   TONSILLECTOMY     as a child   [name], bradycardia and hypotension. Pt with bil pleural effusion and pericardial effusion s/p VATS with pericardial window 8/19. 8/20 cardiac tamponade with arrest,  intubated with bedside mediastinal exploration and ECMO via fem access. OR 8/21 for reexploration due to mediastinal hemorrhage. 8/28 OR for washout with ECMO decannulation and Impella placed. 8/29 brief PEA arrest with DCCV to NSR. 9/2 chest closure. 9/7 trach and Impella removed.9/11 VAC removed. PMHx: CAD s/p recent CABG x5 (admitted  8/3-11/23 with post op Afib), CKD stage II, DM, HTN, AKI, Afib and CVA   Subjective: eager for ice chips and water    Recommendations for follow up therapy are one component of a multi-disciplinary discharge planning process, led by the attending physician.  Recommendations may be updated based on patient status, additional functional criteria and insurance authorization.  Assessment / Plan / Recommendation  11/01/2021    2:00 PM  Clinical Impressions  Clinical Impression Pt presents with significant, diffuse pharyngeal deconditioning as well as laryngeal edema that results in inadequate pharyngeal clearance and airway protection. He has standing secretion in his pharynx although the laryngeal vestibule initially is fairly clear. He starts to cough up smaller amounts of thicker secretions from his trachea but then he cannot clear this fully from his laryngeal vestibule. Residue from ice chips start to mix with standing secretions and he does start to aspirate them during and after the swallow (PAS 8). He cannot adequately clear secretions or residuals as study continues so it  starts to become progressively harder to protect his airway. Recommend that he remain NPO for now; however, would consider limited amounts of ice chips (2-3 at a time) after oral care to allow pt small opportunities to practice swallowing, use this musculature, and work on thinning out secretions. Discussed with pulmonologist who is in agreement. Will continue to follow for dysphagia management.  SLP Visit Diagnosis Dysphagia, pharyngeal phase (R13.13)  Impact on safety and function Severe aspiration risk;Risk for inadequate nutrition/hydration         11/01/2021    2:00 PM  Treatment Recommendations  Treatment Recommendations Therapy as outlined in treatment plan below        11/01/2021    2:00 PM  Prognosis  Prognosis for Safe Diet Advancement Good  Barriers to Reach Goals Cognitive deficits       11/01/2021    2:00 PM  Diet Recommendations  SLP Diet Recommendations NPO  Medication Administration Via alternative means         11/01/2021    2:00 PM  Other Recommendations  Oral Care Recommendations Oral care QID  Other Recommendations Have oral suction available;Place PMSV during PO intake  Follow Up Recommendations Skilled nursing-short term rehab (<3 hours/day)  Assistance recommended at discharge Frequent or constant Supervision/Assistance  Functional Status Assessment Patient has had a recent decline in their functional status and demonstrates the ability to make significant improvements in function in a reasonable and predictable amount of time.       11/01/2021    2:00 PM  Frequency and Duration   Speech Therapy Frequency (ACUTE ONLY) min 2x/week  Treatment Duration 2 weeks          No data to display             11/01/2021    2:00 PM  Pharyngeal Phase  Pharyngeal Phase Impaired  Pharyngeal- Thin Teaspoon Penetration/Aspiration during swallow;Penetration/Apiration after swallow;Reduced pharyngeal peristalsis;Reduced epiglottic inversion;Reduced anterior  laryngeal mobility;Reduced laryngeal elevation;Reduced airway/laryngeal closure;Reduced tongue base retraction;Pharyngeal residue - valleculae;Pharyngeal residue - pyriform;Inter-arytenoid space residue;Lateral channel residue  Pharyngeal Material enters airway, passes BELOW cords without attempt by patient to eject out (silent aspiration)         No data to display           Osie Bond., M.A. [place] Office [phone]  Secure chat preferred  11/01/2021, 2:31 PM

## 2021-11-01 NOTE — Progress Notes (Signed)
[place] Progress Note Patient Name: [name] DOB: 14-Oct-1960 MRN: [id number]   Date of Service  11/01/2021  HPI/Events of Note  Patient continues to be delirious and at risk for falling.  eICU Interventions  Posey belt restraint order renewed.        Kiasha Bellin U Lasheena Frieze 11/01/2021, 12:01 AM

## 2021-11-01 NOTE — Progress Notes (Signed)
Subjective:  had some temp HD catheter issues yesterday but were able to complete dialysis late yesterday-  removed 2000-  tolerated well    Objective Vital signs in last 24 hours: Vitals:   11/01/21 0545 11/01/21 0600 11/01/21 0615 11/01/21 0630  BP: 108/80 125/76 124/78 136/65  Pulse: 87 84 92 91  Resp:  (!) 22 (!) 29 (!) 26  Temp:      TempSrc:      SpO2: 100% 98% 100% 99%  Weight:      Height:       Weight change: -2 kg  Intake/Output Summary (Last 24 hours) at 11/01/2021 0806 Last data filed at 11/01/2021 1962 Gross per 24 hour  Intake 516.45 ml  Output 2500 ml  Net -1983.55 ml   Assessment/Plan:    Pt is a 61 y.o. yo male  with HTN, HLD, DM, A-fib, stroke, CKD, CAD status post CABG on 8/3 presented with SOB, found cardiac tamponade, course complicated by cardiac arrest, use of ECMO, seen as a consultation for the evaluation of AKI    #Acute kidney injury on CKD IIIa.  Ischemic ATN due to cardiogenic shock/cardiac arrest.  Started CRRT on 8/28 for decreasing urine output and fluid/volume management.  -  stopped CRRT on 9/19-  tolerated IHD on 9/21-  plan for it again tomorrow ( Saturday)   Appreciate IR input-  following for an opportunity to place [facility]-  will remind them especially since cath did not work so great yesterday    #Cardiac tamponade/hemorrhagic pericarditis status post pericardial drain placement with subsequent ECMO placement.  decannulated  with placement of Impella.  S/p closure of the sternum and mediastinal washout on 9/2.   #Cardiogenic shock/cardiac arrest: Was on ECMO. Continue pressors per primary team.  Impella out on 9/7- remarkable improvement -  now on midodrine   #Acute hypoxic respiratory failure:  improving - now on TC.  optimizing volume with CRRT -  euvolemic at present    #Acute blood loss anemia: Monitor hemoglobin and transfuse as needed by ICU team.  Aranesp   Increased dose to 100 mcg weekly       [name]    Labs: Basic Metabolic Panel: Recent Labs  Lab 10/31/21 0506 10/31/21 1624 11/01/21 0344  NA 144 141 136  K 5.2* 5.6* 3.8  CL 105 109 94*  CO2 20* 19* 25  GLUCOSE 110* 121* 107*  BUN 98* 115* 65*  CREATININE 5.75* 6.21* 3.44*  CALCIUM 9.9 9.0 9.2  PHOS 6.8* 7.0* 4.4   Liver Function Tests: Recent Labs  Lab 10/31/21 0506 10/31/21 1624 11/01/21 0344  ALBUMIN 2.4* 2.2* 2.6*   No results for input(s): "LIPASE", "AMYLASE" in the last 168 hours. No results for input(s): "AMMONIA" in the last 168 hours. CBC: Recent Labs  Lab 10/28/21 0415 10/28/21 1556 10/29/21 0441 10/30/21 0824 10/31/21 0506 11/01/21 0344  WBC 17.2*  --  23.2* 26.5* 25.5* 24.1*  HGB 7.6*   < > 8.2* 8.1* 8.0* 7.5*  HCT 25.8*   < > 28.4* 27.8* 27.4* 24.9*  MCV 98.1  --  100.4* 101.8* 100.0 98.4  PLT 345  --  350 358 390 426*   < > = values in this interval not displayed.   Cardiac Enzymes: No results for input(s): "CKTOTAL", "CKMB", "CKMBINDEX", "TROPONINI" in the last 168 hours.  CBG: Recent Labs  Lab 10/31/21 1144 10/31/21 1559 10/31/21 2046 11/01/21 0002 11/01/21 0349  GLUCAP 140* 103* 146* 168* 111*  Iron Studies: No results for input(s): "IRON", "TIBC", "TRANSFERRIN", "FERRITIN" in the last 72 hours. Studies/Results: DG Abd Portable 1V  Result Date: 10/31/2021 CLINICAL DATA:  Feeding tube placement EXAM: PORTABLE ABDOMEN - 1 VIEW COMPARISON:  10/29/2021 FINDINGS: Feeding tube coiled in the stomach with the tip in the region of the gastric antrum or proximal duodenum. This has been pulled back since the prior study. Normal bowel gas pattern. IMPRESSION: Feeding tube tip in the region of the gastric antrum or proximal duodenum. Electronically Signed   By: Franchot Gallo M.D.   On: 10/31/2021 16:43   Medications: Infusions:  sodium chloride 10 mL/hr at 10/29/21 1900   sodium chloride     acetaminophen     albumin human 12.5 g (10/31/21 1850)   albumin human      anticoagulant sodium citrate     feeding supplement (VITAL 1.5 CAL) 40 mL/hr at 11/01/21 0639   micafungin (MYCAMINE) 100 mg in sodium chloride 0.9 % 100 mL IVPB Stopped (10/31/21 1321)   norepinephrine (LEVOPHED) Adult infusion Stopped (10/29/21 1923)    Scheduled Medications:  sodium chloride   Intravenous Once   acetaminophen (TYLENOL) oral liquid 160 mg/5 mL  650 mg Per Tube Q6H   ascorbic acid  250 mg Per Tube BID   aspirin  81 mg Per Tube Daily   Chlorhexidine Gluconate Cloth  6 each Topical Daily   Chlorhexidine Gluconate Cloth  6 each Topical Q0600   collagenase   Topical Daily   darbepoetin (ARANESP) injection - NON-DIALYSIS  100 mcg Subcutaneous Q Mon-1800   feeding supplement (PROSource TF20)  60 mL Per Tube TID   fiber  1 packet Per Tube BID   folic acid  1 mg Per Tube Daily   Gerhardt's butt cream   Topical Daily   heparin injection (subcutaneous)  5,000 Units Subcutaneous Q8H   insulin aspart  0-20 Units Subcutaneous Q4H   insulin aspart  2 Units Subcutaneous Q4H   insulin glargine-yfgn  10 Units Subcutaneous QHS   midodrine  15 mg Per Tube TID WC   multivitamin  1 tablet Per Tube QHS   nutrition supplement (JUVEN)  1 packet Per Tube BID BM   mouth rinse  15 mL Mouth Rinse 4 times per day   oxyCODONE  10 mg Per Tube Q6H   Followed by   oxyCODONE  5 mg Per Tube Q6H   pantoprazole  40 mg Per Tube BID   QUEtiapine  50 mg Per Tube QHS   sodium chloride flush  10-40 mL Intracatheter Q12H   sodium chloride flush  10-40 mL Intracatheter Q12H   sodium chloride flush  3 mL Intravenous Q12H   thiamine  100 mg Per Tube Daily   zinc sulfate  220 mg Per Tube Daily    have reviewed scheduled and prn medications.  Physical Exam: General: more awake on TC this AM-  but has mittens on Heart: RRR Lungs: mostly clear Abdomen: slightly distended Extremities: min edema Dialysis Access: right IJ dialysis cath-  60 days old     11/01/2021,8:06 AM  LOS: 34 days

## 2021-11-01 NOTE — Progress Notes (Signed)
eLink Physician-Brief Progress Note Patient Name: LUCCIANO VITALI DOB: January 19, 1961 MRN: [id number]   Date of Service  11/01/2021  HPI/Events of Note  Patient agitated and yelling for something for pain.  eICU Interventions  Tylenol 1000 mg iv x 1 ordered.        Frederik Pear 11/01/2021, 6:58 AM

## 2021-11-01 NOTE — Progress Notes (Signed)
NAME:  [name], MRN:  [id number], DOB:  1960-09-30, LOS: 63 ADMISSION DATE:  09/28/2021, CONSULTATION DATE:  09/29/2021 REFERRING MD:  Kipp Brood - TCTS CHIEF COMPLAINT: Dyspnea   History of Present Illness:  61 year old man who underwent CABG on 8/3, readmitted on 8/19 in setting of dyspnea with a pericardial effusion requiring pericardial window via bedside sternotomy. Also with bilateral pleural effusions. Sustained PEA arrest on 8/20, briefly required VA ECMO which was discontinued on 8/28, Impella placed 8/28, started on CRRT.  Hypoxemia, repeat cardiac arrest on 8/30. Underwent mediastinal washout/sternotomy closure 9/2. Impella removed and tracheostomy completed 9/7.  Pertinent Medical History:  CAD s/p CABG 8/3 DM2 Diverticulosis GERD Hyperlipidemia Hypertension History of stroke Tobacco Abuse  Significant Hospital Events: Including procedures, antibiotic start and stop dates in addition to other pertinent events   8/19 Admitted, s/p pericardial window 8/20 PEA cardiac arrest, left pigtail chest tube placement, PEA cardiac arrest due to tamponade, bedside sternotomy with pericardial drains placed. 8/21 Return to OR for overnight bleeding.  8/22 Minimal chest tube output.  Atrial fibrillation, controled with amiodarone.  8/22 TEE showed EF 35% at baseline, which improved significantly with decreasing ECMO flow.  8/23 Tolerated diuresis  8/24 Mediastinal washout, removal of hematoma 8/25 Ongoing bleeding issues from posterior mediastinum 8/28 Decannulated and off ECMO, 5.5 Impella placed, L femoral HD cath placed and started on CRRT 8/30 Hypoxia and hypotension followed by brief arrest, gentle L lateral chest compressions performed and ROSC in roughly 1 minute. 8/31 Remains deeply sedated on ventilator with open chest.  No acute events overnight. 9/1 No acute events overnight, remains on ventilator deep sedation.  Tolerating aggressive volume removal per CRRT 9/2 Sternotomy  closure 9/3 Wean versed 9/4 FOB, low grade temps, notable thick mucus/frequent suctioning 9/5 Vomiting, ?mucus plugging. PSV 5-6 hours on 10/5 9/6 Impella down to p3 9/7 Impella removal and trach. PRBC and DDAVP in OR  9/8 Low dose epi, weaned on 8/8 9/9 RIJ HD catheter placed 9/11 Started on ATC at 28% and tolerating well 9/12 Remains on 28% ATC 9/13 SB/Junction rhythm with associated hypotension overnight requiring transient pressor escalation. Pressor requirement down in AM. WBC count 23 (18), Co-ox 36%. Resp/Blood Cx. 9/14 Mild bradycardia/relative hypotension with Precedex. Remains on Epi 11mcg, Vaso 0.03. Resp Cx with few gram+ cocci (clusters), yeast. BCx NGTD. 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 Co-ox 48%. CRRT stopped. 9/15 WBC continues to downtrend. Hgb 7.6. Net +1.49L/24H. CRRT resumed. Co-ox 51%. NE started to help with Epi wean. 9/16, Co. oximetry improved, WBC trending down, weaning pressors 9/19 on trach collar for 96 hours.  Weaning off vasopressors.  Trial off CRRT today.  Episode of overnight emesis. 9/20 more awake. CRRT off 9/21 cuffless tracheostomy. Do well with PMV 9/22 failed swallow evaluation  Interim History / Subjective:   Patient very frustrated about sitting in bed with nothing to do.  Failed swallow evaluation today  Objective:  Blood pressure (!) 101/58, pulse 87, temperature 98.5 F (36.9 C), temperature source Oral, resp. rate (!) 32, height 5\' 7"  (1.702 m), weight 84.1 kg, SpO2 98 %. CVP:  [0 mmHg-64 mmHg] 9 mmHg  FiO2 (%):  [28 %] 28 %   Intake/Output Summary (Last 24 hours) at 11/01/2021 1132 Last data filed at 11/01/2021 1100 Gross per 24 hour  Intake 845.28 ml  Output 2500 ml  Net -1654.72 ml    Filed Weights   10/31/21 0600 10/31/21 2145 11/01/21 0500  Weight: 88.4  kg 86.4 kg 84.1 kg   Physical Examination: General: Acutely ill-appearing middle-aged man in NAD. Appears uncomfortable. HEENT:  [place]/AT, anicteric sclera, PERRL, moist mucous membranes. Trach in place. PMV in place Neuro:  Awake, able to speak clearly with PMV.  Can carry on a conversation.  Reasonable limb strength.  Poor truncal control. CV: RRR, no m/g/r. PULM: Mildly tachypneic, breathing unlabored on ATC (5L, FiO2 28%). Lung fields clear.  GI: Soft, nontender, nondistended. Hypoactive bowel sounds. Extremities: Bilateral symmetric 1+ LE edema noted. Skin: Warm/dry, no rashes.  Ancillary tests personally reviewed:   Persistent leukocytosis 24.1  Assessment & Plan:   Acute metabolic encephalopathy.  Agitated at times with poor impulse control. -Continue to wean all sedatives off: Oxycodone taper in place, Seroquel nightly only, Resume ATC low-dose clonazepam  Hemorrhagic pericarditis with tamponade PEA arrest due to tamponade, s/p emergent sternotomy Cardiogenic & hemorrhagic shock, s/p VA ECMO (decannulated 8/29), s/p Impella (removed 9/7) -Off all vasopressors.  EF now normal.  CAD s/p CABG x3 Afib  -Presently rate controlled  Leukocytosis without signs of sepsis Completed. course of daptomycin, completed course of meropenem today. -Finish off 14-day course of micafungin as patient at high risk for fungemia. -Would benefit from a line holiday after dialysis on Saturday.  Acute respiratory failure with hypoxemia and hypercarbia S/p Tracheostomy  -Continue trach collar trial. -Passy-Muir valve. - Downsized to cuffless tracheostomy. -Given inability to swallow and adequate clear secretions will hold on capping trials for now.  AKI with anuria  Hypophosphatemia -Transitioned to IHD. -Attempt line holiday Saturday 9/23 after dialysis -Permcath placement once leukocytosis improving.    DM II with hyperglycemia -Presently well controlled  At Risk Malnutrition  - TF via Cortrak -Swallow evaluation.   Sacral pressure wound - WOC following  Severe physical deconditioning -Continue physical  therapy.  Best Practice (right click and "Reselect all SmartList Selections" daily)  Diet/type: tubefeeds  DVT prophylaxis: prophylactic heparin  GI prophylaxis: PPI Lines: Dialysis catheter only.  For line holiday on Sunday. Foley:  N/A Code Status:  full code Last date of multidisciplinary goals of care discussion patient updated daily.         Kipp Brood, MD [facility] 11/01/21 11:32 AM  Please see [email or web address] for contact info details.  From 7A-7P if no response, please call [phone] After hours, please call ELink [phone]

## 2021-11-01 NOTE — Progress Notes (Addendum)
Patient ID: [name], male   DOB: 02-02-1961, 61 y.o.   MRN: [id number]   Advanced Heart Failure Rounding Note   Subjective:    - 8/19 Pericardial window - 8/20 Cardiac arrest with tamponade -> Emergent bedside washout - 09/29/21 VA Cannulation - 09/30/21 Return to OR for mediastinal hemorrhage - 10/01/21 Developed AF -> amio - 10/02/21 TEE EF 25-30%  - 10/04/21 OR for washout. C/b continued bleeding overnight - 10/07/21 Placement of Impella 5.5 with washout, VA ECMO decannulation. Hypotensive with development of severe RV dysfunction after ECMO off and pressors titrated up. Multiple units of blood products. - 10/08/21 Brief PEA arrest. AFL with RVR >> S/p DCCV to SR, back in AFL shortly after - 8/31 Give 1UPRBCs  - 9/2 OR for chest closure - 9/3 Hypotensive overnight w/ SBPs in 80s. Febrile, mTemp 100.8. CRRT paused. VP increased to 0.04.  - 9/4 s/p bronchoscopy w/ BAL by PCCM, Cx NGTD  - 9/5 vomiting w/ large volume NGT output + watery/foul diarrhea, Tube feeds held. No signs of ileus on KUB. C-diff negative   - 9/7 OR for Impella Extraction and percutaneous tracheostomy. 2 u RBCs. - 9/8 CVVH stopped and line removed for holiday - 9/9 CVVH restarted.  -9/13 Worsening leukocytosis and pressor requirements. Purulent drainage from arterial line. Started Daptomycin, Micafungin and Meropenem. - 9/17 Daptomycin + Micafungin stopped. Remains on Meropenum  -9/19 CRRT holiday.  -9/20 Trach downsized  -9/21 transitioned to [facility]   Tolerated iHD well yesterday, 2L volume removal. CVP 11  No distress. Asking if mittens can be removed.     Objective:     Vital Signs:   Temp:  [97.2 F (36.2 C)-98.5 F (36.9 C)] 98.5 F (36.9 C) (09/22 0300) Pulse Rate:  [77-95] 91 (09/22 0630) Resp:  [13-34] 26 (09/22 0630) BP: (89-151)/(42-122) 136/65 (09/22 0630) SpO2:  [96 %-100 %] 99 % (09/22 0630) FiO2 (%):  [28 %] 28 % (09/22 0300) Weight:  [84.1 kg-86.4 kg] 84.1 kg (09/22 0500) Last BM Date :  10/31/21  Weight change: Filed Weights   10/31/21 0600 10/31/21 2145 11/01/21 0500  Weight: 88.4 kg 86.4 kg 84.1 kg    Intake/Output:   Intake/Output Summary (Last 24 hours) at 11/01/2021 0820 Last data filed at 11/01/2021 5621 Gross per 24 hour  Intake 516.45 ml  Output 2500 ml  Net -1983.55 ml   PHYSICAL EXAM: CVP 11  General:  chronically ill appearing. No respiratory difficulty HEENT: normal Neck: supple + trach collar, JVD 11 cm. + Rt IJ HD cath Carotids 2+ bilat; no bruits. No lymphadenopathy or thyromegaly appreciated. Cor: PMI nondisplaced. Regular rate & rhythm. No rubs, gallops or murmurs. Lungs: clear Abdomen: soft, nontender, nondistended. No hepatosplenomegaly. No bruits or masses. Good bowel sounds. Extremities: no cyanosis, clubbing, rash, edema +SCDs  Neuro: alert & oriented x 3, cranial nerves grossly intact. moves all 4 extremities w/o difficulty. Affect pleasant.   Telemetry: SR 80s   Labs: Basic Metabolic Panel: Recent Labs  Lab 10/28/21 0415 10/28/21 1556 10/29/21 0441 10/29/21 1648 10/30/21 0414 10/30/21 1636 10/31/21 0506 10/31/21 1624 11/01/21 0344  NA 138   < > 137   < > 143 143 144 141 136  K 4.0   < > 4.5   < > 4.5 4.4 5.2* 5.6* 3.8  CL 103   < > 105   < > 106 108 105 109 94*  CO2 24   < > 26   < > 27 19* 20*  19* 25  GLUCOSE 192*   < > 154*   < > 131* 192* 110* 121* 107*  BUN 21   < > 21   < > 54* 75* 98* 115* 65*  CREATININE 1.65*   < > 1.70*   < > 3.50* 4.62* 5.75* 6.21* 3.44*  CALCIUM 8.2*   < > 8.5*   < > 9.5 9.2 9.9 9.0 9.2  MG 2.5*  --  2.5*  --  2.7*  --  3.0*  --  2.0  PHOS 2.5   < > 2.7   < > 4.6 5.6* 6.8* 7.0* 4.4   < > = values in this interval not displayed.    Liver Function Tests: Recent Labs  Lab 10/30/21 0414 10/30/21 1636 10/31/21 0506 10/31/21 1624 11/01/21 0344  ALBUMIN 2.3* 2.3* 2.4* 2.2* 2.6*   No results for input(s): "LIPASE", "AMYLASE" in the last 168 hours. No results for input(s): "AMMONIA" in the  last 168 hours.  CBC: Recent Labs  Lab 10/28/21 0415 10/28/21 1556 10/29/21 0441 10/30/21 0824 10/31/21 0506 11/01/21 0344  WBC 17.2*  --  23.2* 26.5* 25.5* 24.1*  HGB 7.6* 8.4* 8.2* 8.1* 8.0* 7.5*  HCT 25.8* 28.6* 28.4* 27.8* 27.4* 24.9*  MCV 98.1  --  100.4* 101.8* 100.0 98.4  PLT 345  --  350 358 390 426*    Cardiac Enzymes: No results for input(s): "CKTOTAL", "CKMB", "CKMBINDEX", "TROPONINI" in the last 168 hours.   BNP: BNP (last 3 results) Recent Labs    12/24/20 0651 09/28/21 0405  BNP 412.0* 826.6*    ProBNP (last 3 results) No results for input(s): "PROBNP" in the last 8760 hours.    Other results:  Imaging: DG Abd Portable 1V  Result Date: 10/31/2021 CLINICAL DATA:  Feeding tube placement EXAM: PORTABLE ABDOMEN - 1 VIEW COMPARISON:  10/29/2021 FINDINGS: Feeding tube coiled in the stomach with the tip in the region of the gastric antrum or proximal duodenum. This has been pulled back since the prior study. Normal bowel gas pattern. IMPRESSION: Feeding tube tip in the region of the gastric antrum or proximal duodenum. Electronically Signed   By: Franchot Gallo M.D.   On: 10/31/2021 16:43     Medications:     Scheduled Medications:  sodium chloride   Intravenous Once   acetaminophen (TYLENOL) oral liquid 160 mg/5 mL  650 mg Per Tube Q6H   ascorbic acid  250 mg Per Tube BID   aspirin  81 mg Per Tube Daily   Chlorhexidine Gluconate Cloth  6 each Topical Daily   Chlorhexidine Gluconate Cloth  6 each Topical Q0600   collagenase   Topical Daily   darbepoetin (ARANESP) injection - NON-DIALYSIS  100 mcg Subcutaneous Q Mon-1800   feeding supplement (PROSource TF20)  60 mL Per Tube TID   fiber  1 packet Per Tube BID   folic acid  1 mg Per Tube Daily   Gerhardt's butt cream   Topical Daily   heparin injection (subcutaneous)  5,000 Units Subcutaneous Q8H   insulin aspart  0-20 Units Subcutaneous Q4H   insulin aspart  2 Units Subcutaneous Q4H   insulin  glargine-yfgn  10 Units Subcutaneous QHS   midodrine  15 mg Per Tube TID WC   multivitamin  1 tablet Per Tube QHS   nutrition supplement (JUVEN)  1 packet Per Tube BID BM   mouth rinse  15 mL Mouth Rinse 4 times per day   oxyCODONE  10 mg Per Tube  Q6H   Followed by   oxyCODONE  5 mg Per Tube Q6H   pantoprazole  40 mg Per Tube BID   QUEtiapine  50 mg Per Tube QHS   sodium chloride flush  10-40 mL Intracatheter Q12H   sodium chloride flush  10-40 mL Intracatheter Q12H   sodium chloride flush  3 mL Intravenous Q12H   thiamine  100 mg Per Tube Daily   zinc sulfate  220 mg Per Tube Daily    Infusions:  sodium chloride 10 mL/hr at 10/29/21 1900   sodium chloride     acetaminophen     albumin human 12.5 g (10/31/21 1850)   albumin human     anticoagulant sodium citrate     feeding supplement (VITAL 1.5 CAL) 40 mL/hr at 11/01/21 0639   micafungin (MYCAMINE) 100 mg in sodium chloride 0.9 % 100 mL IVPB Stopped (10/31/21 1321)   norepinephrine (LEVOPHED) Adult infusion Stopped (10/29/21 1923)    PRN Medications: sodium chloride, albumin human, anticoagulant sodium citrate, camphor-menthol, clonazePAM, dextrose, diphenhydrAMINE, heparin, hydrALAZINE, HYDROmorphone (DILAUDID) injection, ipratropium-albuterol, ondansetron (ZOFRAN) IV, mouth rinse, polyethylene glycol, polyvinyl alcohol, sodium chloride flush, sodium chloride flush, sodium chloride flush, white petrolatum   Assessment/Plan:    1. Shock - mixed cardiogenic/hemorrhagic -> VA ECMO -> decannulated on 8/28 to Impella 5.5 - Echo 08/28: Underfilled LV, EF 55-60% with severe LVH and near normal RV systsystolic function.  - Impella extracted 9/7 -Bedside echo 09/13 - LV function preserved, RV mildly reduced. Small clot in posterior pericardium but no hemodynamic effect - Concern for worsening sepsis with leukocytosis and increasing pressor requirements on 09/13. Purulent drainage from [place], replaced. - BC X 2 NGTD, tracheal aspirate  pending. Procalcitonin 1.63.  - Given prolonged abx course earlier in stay, addition of Daptomycin, Micafungin and meropenem recommended per ID.  - Plan to complete total of 14 days of Micafungin .  - On midodrine 15 mg tid - Stable BP.   2. Cardiac arrest (PEA/bradycardic) - 8/19 in setting of tamponade - PEA arrest again on 08/28  3. Cardiac tamponade with emergent bedside sternotomy  - Diffuse epicardial bleeding with post-op Dresslers - return to OR 8/21 and 8/24 for washouts.  - Washout 8/28 in OR. Multiple units of blood products in OR.   - Chest closed on 9/2 - Hgb 8.1>7.6>8.2> 8>7.5   4. Acute hypoxemic respiratory failure - Off ECMO.  -  Perc Trach placed 9/7  - s/p bronchoscopy w/ BAL 9/4.  - tracheal aspirate 09/13 growing few GPC and rare yeast  - Started back on IV abx as above d/t concern for sepsis.  - Trach downsized - Management per CCM  5. AKI due to ATN - CRRT started 08/28.  - Remains on anuric - 9/19 given CRRT holiday. Now off pressors - Transitioned to [facility] 9/21. Next session 9/23    6. 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 Now off colchicine with CRRT.  7. 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 Continue aspirin 81 mg daily  8. DM2 - continue SSI   9. PAF/AFL - Tolerates poorly.  - Recurrent AFL. S/p DCCV  to SR 08/29.  - Off amio d/t bradycardia - Not on [facility] for now d/t bleeding, DVT dose heparin.  - In NSR   10. ID - Completed vancomycin/meropenem with open chest.  - WBC 17.2 >23> 26>23>>24 . ? Possible aspiration with vomiting.  - now off meropenum    11. Neuro Oriented x3  12. FEN - TFs ongoing .   13. Hyperkalemia - resolved w/ HD   14. Unstageable Pressure Ulcer, Buttock  - WOC following.   15. Abdominal Pain - Resolved.   Not eligible for LTAC given insurance. CM trying for SNF   Brittainy Rosita Fire, PA-C  8:20 AM  Patient seen and examined with the above-signed Advanced Practice Provider  and/or Housestaff. I personally reviewed laboratory data, imaging studies and relevant notes. I independently examined the patient and formulated the important aspects of the plan. I have edited the note to reflect any of my changes or salient points. I have personally discussed the plan with the patient and/or family.  Off pressors. Working with PT/OT. PM valve in place. Feels tired. Thirsty. Denies CP. Tolerated iHD  General:  Sitting in chair HEENT: normal + cor-trak Neck: supple. RIJ HD cath. + trach  Cor: PMI nondisplaced. Sternal wound ok Regular rate & rhythm. No rubs, gallops or murmurs. Lungs: clear Abdomen: soft, nontender, nondistended. No hepatosplenomegaly. No bruits or masses. Good bowel sounds. Extremities: no cyanosis, clubbing, rash, edema Neuro: follows commands. Moves all 4   Continues to improve. Tolerated iHD well. CVP 11. BP stable on midodrine.   CCM managing airway.   Working on SNF placement.   AHF team will see again Monday. Call with questions over the w/e.   Glori Bickers, MD  4:44 PM

## 2021-11-01 NOTE — Progress Notes (Signed)
Occupational Therapy Treatment Patient Details Name: [name] MRN: [id number] DOB: 1960/10/30 Today's Date: 11/01/2021   History of present illness Pt is a 61 y.o. male admitted 09/28/21 with SOB, bradycardia, hypotension. Workup for bilateral pleural effusion, pericardial effusion s/p VATS with pericardial window 8/19. Cardiac arrest with tamponade 8/20, intubated with bedside mediastinal exploration, ECMO via fem access. Pt with mediastinal hemorrhage s/p reexploration 8/21. To OR 8/28 for washout, ECMO decannulation, Impella placement. CRRT initiated 8/28. Brief PEA arrest 8/29 with DCCV to NSR.  S/p chest closure 9/2. S/p trach and impella removed 9/7. S/p wound vac removal 9/11. Course complicated by bouts of hypotension and bradycardia. Off CRRT 9/19. PMH includes CAD (s/p recent CABG 09/12/21), CKD2, DM, HTN, AKI, afib, CVA.   OT comments  Pt seen as cotreat with PT to increase mobility. Able to mobilize OOB with use of Stedy and +2 Mod A. Requires overall max to total A with ADL tasks due to weakness, decreased coordination, poor midline postural awareness and cognitive deficits as listed below. Significant focal weakness noted in R hand with inability to use a functional grasp - MD and nsg notified. VSS on 28% FiO2 throughout session. Pt will benefit from extensive post acute rehab. Will to continue to follow.    Recommendations for follow up therapy are one component of a multi-disciplinary discharge planning process, led by the attending physician.  Recommendations may be updated based on patient status, additional functional criteria and insurance authorization.    Follow Up Recommendations  Skilled nursing-short term rehab (<3 hours/day)    Assistance Recommended at Discharge Frequent or constant Supervision/Assistance  Patient can return home with the following  Two people to help with walking and/or transfers;Two people to help with bathing/dressing/bathroom;Direct supervision/assist  for medications management;Direct supervision/assist for financial management;Assistance with feeding;Assistance with cooking/housework;Assist for transportation;Help with stairs or ramp for entrance   Equipment Recommendations  Other (comment) (TBA)    Recommendations for Other Services      Precautions / Restrictions Precautions Precautions: Sternal;Fall;Other (comment) Precaution Comments: trach collar, cortrak, flexiseal; R side intattention; Restrictions Weight Bearing Restrictions: Yes Other Position/Activity Restrictions: initial CABG 09/12/21       Mobility Bed Mobility Overal bed mobility: Needs Assistance Bed Mobility: Supine to Sit     Supine to sit: Max assist, +2 for physical assistance          Transfers Overall transfer level: Needs assistance   Transfers: Sit to/from Stand Sit to Stand: Mod assist, +2 physical assistance           General transfer comment: Stedy used to assist     Balance     Sitting balance-Leahy Scale: Poor Sitting balance - Comments: Lateral leans; mostly toward L Postural control: Left lateral lean   Standing balance-Leahy Scale: Zero                             ADL either performed or assessed with clinical judgement   ADL Overall ADL's : Needs assistance/impaired Eating/Feeding: NPO   Grooming: Moderate assistance;Sitting (using L hand) Grooming Details (indicate cue type and reason): using toothette with suction; due to attentional deficits, left suction hanging in mouth and required cues to remove it Upper Body Bathing: Maximal assistance   Lower Body Bathing: Total assistance   Upper Body Dressing : Total assistance   Lower Body Dressing: Total assistance       Toileting- Clothing Manipulation and Hygiene: Total assistance (rectal foley)  Functional mobility during ADLs: Moderate assistance;+2 for physical assistance (to stand with Charlaine Dalton)      Extremity/Trunk Assessment Upper Extremity  Assessment Upper Extremity Assessment: RUE deficits/detail;LUE deficits/detail RUE Deficits / Details: shoulder/elbow AROM generally WFL; ableot extend wrist, however absent intrinsic movement; moves into tenodesis grasp; unableot maintain grasp on Stedy significant difficulty using hand funcitonally RUE Sensation: decreased proprioception;decreased light touch RUE Coordination: decreased fine motor;decreased gross motor LUE Deficits / Details: genealized weakness; weaker proximally; able to achieve @ 90 degree FF and ABd; limited ER ; unabl eot reach back of head   Lower Extremity Assessment Lower Extremity Assessment: Defer to PT evaluation        Vision   Vision Assessment?: Vision impaired- to be further tested in functional context Additional Comments: poor visual attention noted; will further assess   Perception Perception Perception: Impaired   Praxis Praxis Praxis: Impaired (poor sense of midlein awareness) Praxis Impairment Details: Initiation    Cognition Arousal/Alertness: Awake/alert Behavior During Therapy: Flat affect Overall Cognitive Status: Impaired/Different from baseline Area of Impairment: Orientation, Attention, Memory, Following commands, Safety/judgement, Awareness, Problem solving                 Orientation Level: Disoriented to, Time Current Attention Level: Sustained Memory: Decreased recall of precautions Following Commands: Follows one step commands with increased time, Follows one step commands consistently Safety/Judgement: Decreased awareness of safety, Decreased awareness of deficits Awareness: Intellectual, Emergent Problem Solving: Slow processing, Decreased initiation, Difficulty sequencing, Requires verbal cues, Requires tactile cues          Exercises General Exercises - Upper Extremity Shoulder Flexion: AAROM, Both, 10 reps, Standing Elbow Flexion: AAROM, Both, Sidelying Elbow Extension: AAROM, Both, Sidelying Wrist Flexion:  AROM, Both, Sidelying Wrist Extension: AAROM, Both, Sidelying Digit Composite Flexion: Right, PROM, AAROM, 10 reps Composite Extension: PROM, AAROM, Right    Shoulder Instructions       General Comments wound on buttocks    Pertinent Vitals/ Pain       Pain Assessment Pain Assessment: Faces Faces Pain Scale: Hurts even more Pain Location: buttocks Pain Descriptors / Indicators: Grimacing, Sore Pain Intervention(s): Limited activity within patient's tolerance  Home Living                                          Prior Functioning/Environment              Frequency  Min 2X/week        Progress Toward Goals  OT Goals(current goals can now be found in the care plan section)  Progress towards OT goals: Progressing toward goals  Acute Rehab OT Goals Patient Stated Goal: to get better OT Goal Formulation: With patient Time For Goal Achievement: 11/04/21 Potential to Achieve Goals: Good ADL Goals Pt Will Perform Grooming: with mod assist;sitting;bed level Pt Will Perform Upper Body Bathing: with mod assist;sitting;bed level Pt/caregiver will Perform Home Exercise Program: Increased ROM;Increased strength;Both right and left upper extremity;With written HEP provided;With minimal assist Additional ADL Goal #1: Pt will demonstrate sustained attention for duration for grooming task with Min cues Additional ADL Goal #2: Pt will perform bed mobility with Mod A in preparation for ADLs Additional ADL Goal #3: Pt will grasp/release objects using a funcitonal pinch pattern for ADL with min A  Plan Frequency remains appropriate;Discharge plan needs to be updated    Co-evaluation    PT/OT/SLP Co-Evaluation/Treatment:  Yes Reason for Co-Treatment: Complexity of the patient's impairments (multi-system involvement);Necessary to address cognition/behavior during functional activity;For patient/therapist safety;To address functional/ADL transfers PT goals addressed  during session: Mobility/safety with mobility;Balance;Strengthening/ROM OT goals addressed during session: ADL's and self-care      AM-PAC OT "6 Clicks" Daily Activity     Outcome Measure   Help from another person eating meals?: Total Help from another person taking care of personal grooming?: A Lot Help from another person toileting, which includes using toliet, bedpan, or urinal?: Total Help from another person bathing (including washing, rinsing, drying)?: A Lot Help from another person to put on and taking off regular upper body clothing?: Total Help from another person to put on and taking off regular lower body clothing?: Total 6 Click Score: 8    End of Session Equipment Utilized During Treatment: Gait belt;Oxygen (28% FiO2)  OT Visit Diagnosis: Unsteadiness on feet (R26.81);Other abnormalities of gait and mobility (R26.89);Muscle weakness (generalized) (M62.81)   Activity Tolerance Patient tolerated treatment well   Patient Left in chair;with call bell/phone within reach;with restraints reapplied   Nurse Communication Mobility status        Time: 1020-1059 OT Time Calculation (min): 39 min  Charges: OT General Charges $OT Visit: 1 Visit OT Treatments $Self Care/Home Management : 8-22 mins $Therapeutic Activity: 8-22 mins  Maurie Boettcher, OT/L   Acute OT Clinical Specialist [place] Pager [phone] Office [phone]   [facility] 11/01/2021, 1:07 PM

## 2021-11-01 NOTE — Progress Notes (Signed)
Physical Therapy Treatment Patient Details Name: [name] MRN: [id number] DOB: 06/10/1960 Today's Date: 11/01/2021   History of Present Illness Pt is a 61 y.o. male admitted 09/28/21 with SOB, bradycardia, hypotension. Workup for bilateral pleural effusion, pericardial effusion s/p VATS with pericardial window 8/19. Cardiac arrest with tamponade 8/20, intubated with bedside mediastinal exploration, ECMO via fem access. Pt with mediastinal hemorrhage s/p reexploration 8/21. To OR 8/28 for washout, ECMO decannulation, Impella placement. CRRT initiated 8/28. Brief PEA arrest 8/29 with DCCV to NSR.  S/p chest closure 9/2. S/p trach and impella removed 9/7. S/p wound vac removal 9/11. Course complicated by bouts of hypotension and bradycardia. Off CRRT 9/19. PMH includes CAD (s/p recent CABG 09/12/21), CKD2, DM, HTN, AKI, afib, CVA.   PT Comments    Pt progressing with mobility. Today's session focused on sitting balance and standing trials with stedy frame, pt requiring mod-maxA+2 for mobility; recommend nursing use maxisky lift for OOB activity. Pt with increased flat affect this session; answering some questions with PMSV donned, otherwise minimally conversant. Pt demonstrates improving response time to simple commands regarding trunk control and RLE movement. Pt remains limited by generalized weakness, decreased activity tolerance, poor balance strategies/postural reactions and impaired cognition. Will continue to follow acutely to address established goals.    Recommendations for follow up therapy are one component of a multi-disciplinary discharge planning process, led by the attending physician.  Recommendations may be updated based on patient status, additional functional criteria and insurance authorization.  Follow Up Recommendations  Skilled nursing-short term rehab (<3 hours/day) Can patient physically be transported by private vehicle: No   Assistance Recommended at Discharge Frequent or  constant Supervision/Assistance  Patient can return home with the following Two people to help with walking and/or transfers;Two people to help with bathing/dressing/bathroom;Direct supervision/assist for medications management;Assistance with feeding;Assistance with cooking/housework;Direct supervision/assist for financial management;Assist for transportation   Equipment Recommendations   (defer to next venue)    Recommendations for Other Services       Precautions / Restrictions Precautions Precautions: Sternal;Fall;Other (comment) Precaution Comments: trach collar, cortrak, flexiseal; R side intattention; Restrictions Weight Bearing Restrictions: Yes Other Position/Activity Restrictions: initial CABG 09/12/21     Mobility  Bed Mobility Overal bed mobility: Needs Assistance Bed Mobility: Sidelying to Sit, Rolling Rolling: Mod assist, +2 for physical assistance         General bed mobility comments: attempted log roll towards L side with modA+2 for LE management and trunk elevation, cues for sequencing    Transfers Overall transfer level: Needs assistance Equipment used: Ambulation equipment used Transfers: Sit to/from Stand Sit to Stand: Mod assist, +2 physical assistance           General transfer comment: left lateral lean requiring increased cues to maintain midline and for anterior weight translation, assist for RUE and RLE placement on stedy, multimodal cues for sequencing, modA+2 for trunk elevation and assist to keep RUE grip on frame; modA+2 to stand from stedy seat, increased fatigue; maxA+2 for eccentric lowering into recliner    Ambulation/Gait                   Stairs             Wheelchair Mobility    Modified Rankin (Stroke Patients Only)       Balance Overall balance assessment: Needs assistance Sitting-balance support: Feet supported, No upper extremity supported Sitting balance-Leahy Scale: Poor Sitting balance - Comments: L  lateral and posterior lean, able to correct  with max verbal cues and min-modA Postural control: Left lateral lean Standing balance support: During functional activity, Bilateral upper extremity supported Standing balance-Leahy Scale: Zero Standing balance comment: reliant on UE support and external assist for static standing in stedy frame, bilateral knee instability                            Cognition Arousal/Alertness: Awake/alert Behavior During Therapy: Flat affect Overall Cognitive Status: Impaired/Different from baseline Area of Impairment: Orientation, Attention, Memory, Following commands, Safety/judgement, Awareness, Problem solving                 Orientation Level: Disoriented to, Time Current Attention Level: Sustained Memory: Decreased recall of precautions Following Commands: Follows one step commands with increased time, Follows one step commands consistently Safety/Judgement: Decreased awareness of safety, Decreased awareness of deficits Awareness: Intellectual, Emergent Problem Solving: Slow processing, Decreased initiation, Difficulty sequencing, Requires verbal cues, Requires tactile cues General Comments: pt with PMSV donned and able to answer questions. increased reaction time to commands this session, requires frequent cues        Exercises General Exercises - Lower Extremity Ankle Circles/Pumps: AROM, Both, Seated (against resistance) Long Arc Quad: AROM, Right, Seated (tactile cues; slight resistance provided for knee flex/ext) Hip Flexion/Marching: PROM, Right    General Comments General comments (skin integrity, edema, etc.): wound on buttocks      Pertinent Vitals/Pain Pain Assessment Pain Assessment: Faces Faces Pain Scale: Hurts little more Pain Location: buttocks Pain Descriptors / Indicators: Grimacing, Sore Pain Intervention(s): Limited activity within patient's tolerance, Monitored during session, Repositioned    Home  Living                          Prior Function            PT Goals (current goals can now be found in the care plan section) Progress towards PT goals: Progressing toward goals    Frequency    Min 3X/week      PT Plan Current plan remains appropriate    Co-evaluation PT/OT/SLP Co-Evaluation/Treatment: Yes Reason for Co-Treatment: Complexity of the patient's impairments (multi-system involvement);Necessary to address cognition/behavior during functional activity;For patient/therapist safety;To address functional/ADL transfers PT goals addressed during session: Mobility/safety with mobility;Balance;Strengthening/ROM OT goals addressed during session: ADL's and self-care      AM-PAC PT "6 Clicks" Mobility   Outcome Measure  Help needed turning from your back to your side while in a flat bed without using bedrails?: A Lot Help needed moving from lying on your back to sitting on the side of a flat bed without using bedrails?: A Lot Help needed moving to and from a bed to a chair (including a wheelchair)?: Total Help needed standing up from a chair using your arms (e.g., wheelchair or bedside chair)?: Total Help needed to walk in hospital room?: Total Help needed climbing 3-5 steps with a railing? : Total 6 Click Score: 8    End of Session Equipment Utilized During Treatment: Gait belt Activity Tolerance: Patient tolerated treatment well Patient left: in chair;with call bell/phone within reach;with restraints reapplied Nurse Communication: Mobility status;Need for lift equipment PT Visit Diagnosis: Other abnormalities of gait and mobility (R26.89);Difficulty in walking, not elsewhere classified (R26.2);Muscle weakness (generalized) (M62.81)     Time: 1020-1100 PT Time Calculation (min) (ACUTE ONLY): 40 min  Charges:  $Therapeutic Activity: 8-22 mins  Mabeline Caras, PT, DPT Acute Rehabilitation Services  Personal: [facility] Office:  [place] 11/01/2021, 1:50 PM

## 2021-11-02 ENCOUNTER — Inpatient Hospital Stay (HOSPITAL_COMMUNITY): Payer: Self-pay

## 2021-11-02 LAB — CBC
HCT: 24 % — ABNORMAL LOW (ref 39.0–52.0)
Hemoglobin: 7.3 g/dL — ABNORMAL LOW (ref 13.0–17.0)
MCH: 30.2 pg (ref 26.0–34.0)
MCHC: 30.4 g/dL (ref 30.0–36.0)
MCV: 99.2 fL (ref 80.0–100.0)
Platelets: 445 10*3/uL — ABNORMAL HIGH (ref 150–400)
RBC: 2.42 MIL/uL — ABNORMAL LOW (ref 4.22–5.81)
RDW: 23.4 % — ABNORMAL HIGH (ref 11.5–15.5)
WBC: 24.7 10*3/uL — ABNORMAL HIGH (ref 4.0–10.5)
nRBC: 1.7 % — ABNORMAL HIGH (ref 0.0–0.2)

## 2021-11-02 LAB — COOXEMETRY PANEL
Carboxyhemoglobin: 2.3 % — ABNORMAL HIGH (ref 0.5–1.5)
Methemoglobin: <0.7 % (ref 0.0–1.5)
O2 Saturation: 71.7 %
Total hemoglobin: 7.8 g/dL — ABNORMAL LOW (ref 12.0–16.0)

## 2021-11-02 LAB — GLUCOSE, CAPILLARY
Glucose-Capillary: 178 mg/dL — ABNORMAL HIGH (ref 70–99)
Glucose-Capillary: 165 mg/dL — ABNORMAL HIGH (ref 70–99)
Glucose-Capillary: 155 mg/dL — ABNORMAL HIGH (ref 70–99)
Glucose-Capillary: 233 mg/dL — ABNORMAL HIGH (ref 70–99)
Glucose-Capillary: 157 mg/dL — ABNORMAL HIGH (ref 70–99)
Glucose-Capillary: 237 mg/dL — ABNORMAL HIGH (ref 70–99)

## 2021-11-02 LAB — RENAL FUNCTION PANEL
Albumin: 2.6 g/dL — ABNORMAL LOW (ref 3.5–5.0)
Anion gap: 16 — ABNORMAL HIGH (ref 5–15)
BUN: 114 mg/dL — ABNORMAL HIGH (ref 8–23)
CO2: 23 mmol/L (ref 22–32)
Calcium: 9.1 mg/dL (ref 8.9–10.3)
Chloride: 92 mmol/L — ABNORMAL LOW (ref 98–111)
Creatinine, Ser: 5.53 mg/dL — ABNORMAL HIGH (ref 0.61–1.24)
GFR, Estimated: 11 mL/min — ABNORMAL LOW (ref >60)
Glucose, Bld: 219 mg/dL — ABNORMAL HIGH (ref 70–99)
Phosphorus: 6.5 mg/dL — ABNORMAL HIGH (ref 2.5–4.6)
Potassium: 3.5 mmol/L (ref 3.5–5.1)
Sodium: 131 mmol/L — ABNORMAL LOW (ref 135–145)

## 2021-11-02 LAB — MAGNESIUM: Magnesium: 2.4 mg/dL (ref 1.7–2.4)

## 2021-11-02 MED ORDER — ALBUMIN HUMAN 25 % IV SOLN: 25.0000 g | Freq: Once | INTRAVENOUS | Status: AC

## 2021-11-02 MED ORDER — SODIUM CHLORIDE 0.9 % IV SOLN
250.0000 mL | INTRAVENOUS | Status: DC
  Administered 2021-11-02: 250 mL via INTRAVENOUS

## 2021-11-02 MED ORDER — ALBUMIN HUMAN 25 % IV SOLN
INTRAVENOUS | Status: AC
  Administered 2021-11-02: 25 g via INTRAVENOUS
  Filled 2021-11-02: qty 100

## 2021-11-02 MED ORDER — NOREPINEPHRINE 4 MG/250ML-% IV SOLN
2.0000 ug/min | INTRAVENOUS | Status: DC
  Administered 2021-11-02: 2 ug/min via INTRAVENOUS
  Administered 2021-11-03: 5 ug/min via INTRAVENOUS
  Administered 2021-11-03: 6 ug/min via INTRAVENOUS
  Administered 2021-11-04: 4 ug/min via INTRAVENOUS
  Filled 2021-11-02 (×5): qty 250

## 2021-11-02 NOTE — Progress Notes (Signed)
16 Days Post-Op Procedure(s) (LRB): REMOVAL OF IMPELLA LEFT VENTRICULAR ASSIST DEVICE (Right) TRANSESOPHAGEAL ECHOCARDIOGRAM (TEE) (N/A) PERCUTANEOUS TRACHEOSTOMY USING SHILEY FLEXIBLE 8 mm CUFFED TRACH. (N/A) Subjective:  On HD in bed. Had to increase NE a little at start but overall stable.  Objective: Vital signs in last 24 hours: Temp:  [98.1 F (36.7 C)-98.9 F (37.2 C)] 98.9 F (37.2 C) (09/23 0715) Pulse Rate:  [76-90] 89 (09/23 0945) Cardiac Rhythm: Normal sinus rhythm (09/23 0800) Resp:  [18-32] 28 (09/23 0945) BP: (81-138)/(43-97) 127/61 (09/23 0945) SpO2:  [82 %-100 %] 96 % (09/23 0945) FiO2 (%):  [28 %] 28 % (09/23 0945) Weight:  [84.6 kg] 84.6 kg (09/23 0715)  Hemodynamic parameters for last 24 hours: CVP:  [2 mmHg-11 mmHg] 3 mmHg  Intake/Output from previous day: 09/22 0701 - 09/23 0700 In: 3223.2 [I.V.:830.7; NG/GT:1537.3; IV Piggyback:455.2] Out: 1000 [Stool:1000] Intake/Output this shift: Total I/O In: 407.8 [I.V.:74.8; NG/GT:283; IV Piggyback:50] Out: -   General appearance: calm and resting comfortably Neurologic: follows commands Heart: regular rate and rhythm, S1, S2 normal, no murmur Lungs: rhonchi bilaterally Abdomen: soft, non-tender; bowel sounds normal Extremities: warm, no edema Wound: chest dressing dry  Lab Results: Recent Labs    11/01/21 0344 11/02/21 0439  WBC 24.1* 24.7*  HGB 7.5* 7.3*  HCT 24.9* 24.0*  PLT 426* 445*   BMET:  Recent Labs    11/01/21 0344 11/02/21 0439  NA 136 131*  K 3.8 3.5  CL 94* 92*  CO2 25 23  GLUCOSE 107* 219*  BUN 65* 114*  CREATININE 3.44* 5.53*  CALCIUM 9.2 9.1    PT/INR: No results for input(s): "LABPROT", "INR" in the last 72 hours. ABG    Component Value Date/Time   PHART 7.301 (L) 10/22/2021 2354   HCO3 19.3 (L) 10/22/2021 2354   TCO2 20 (L) 10/22/2021 2354   ACIDBASEDEF 7.0 (H) 10/22/2021 2354   O2SAT 71.7 11/02/2021 0439   CBG (last 3)  Recent Labs    11/01/21 2340  11/02/21 0311 11/02/21 0813  GLUCAP 164* 178* 165*    Assessment/Plan: S/P Procedure(s) (LRB): REMOVAL OF IMPELLA LEFT VENTRICULAR ASSIST DEVICE (Right) TRANSESOPHAGEAL ECHOCARDIOGRAM (TEE) (N/A) PERCUTANEOUS TRACHEOSTOMY USING SHILEY FLEXIBLE 8 mm CUFFED TRACH. (N/A)  Hemodynamically stable on low dose NE to support BP with HD. Continues on midodrine 15 tid.  Continuing intermittent HD. Plan to remove catheter from right neck after HD today since it is old and then possibly have perm cath inserted Monday.  Tolerating tube feeds at goal. Failed FEES yesterday.   Persistent leukocytosis with no fever. Removing neck line after HD today. Chest wound could be a source but I cleaned it up yesterday and we will see what happens with dressing changes.  Continue PT/OT.    LOS: 35 days    [name] 11/02/2021

## 2021-11-02 NOTE — Progress Notes (Signed)
Received patient in bed to unit.  Alert and oriented.  Informed consent signed and in chart.   Treatment initiated: 7614 Treatment completed: 1208  Patient tolerated well.  Transported back to the room  Alert, without acute distress.  Hand-off given to patient's nurse.   Access used: HD cath  Access issues: NA  Total UF removed: kept even Medication(s) given: Albumin 25% 25G IVPB, Heparin Dwells 2400 units Post HD VS: 98.4    121/53    89    23    99% TC @ 28% O2 Post HD weight: 84.6kg   [facility]

## 2021-11-02 NOTE — Plan of Care (Signed)
Problem: Education: Goal: Will demonstrate proper wound care and an understanding of methods to prevent future damage Outcome: Progressing Goal: Knowledge of disease or condition will improve Outcome: Progressing Goal: Knowledge of the prescribed therapeutic regimen will improve Outcome: Progressing Goal: Individualized Educational Video(s) Outcome: Progressing   Problem: Activity: Goal: Risk for activity intolerance will decrease Outcome: Progressing   Problem: Cardiac: Goal: Will achieve and/or maintain hemodynamic stability Outcome: Progressing   Problem: Clinical Measurements: Goal: Postoperative complications will be avoided or minimized Outcome: Progressing   Problem: Respiratory: Goal: Respiratory status will improve Outcome: Progressing   Problem: Skin Integrity: Goal: Wound healing without signs and symptoms of infection Outcome: Progressing Goal: Risk for impaired skin integrity will decrease Outcome: Progressing   Problem: Urinary Elimination: Goal: Ability to achieve and maintain adequate renal perfusion and functioning will improve Outcome: Progressing   Problem: Education: Goal: Understanding of cardiac disease, CV risk reduction, and recovery process will improve Outcome: Progressing Goal: Individualized Educational Video(s) Outcome: Progressing   Problem: Activity: Goal: Ability to tolerate increased activity will improve Outcome: Progressing   Problem: Cardiac: Goal: Ability to achieve and maintain adequate cardiovascular perfusion will improve Outcome: Progressing   Problem: Health Behavior/Discharge Planning: Goal: Ability to safely manage health-related needs after discharge will improve Outcome: Progressing   Problem: Education: Goal: Knowledge of General Education information will improve Description: Including pain rating scale, medication(s)/side effects and non-pharmacologic comfort measures Outcome: Progressing   Problem: Health  Behavior/Discharge Planning: Goal: Ability to manage health-related needs will improve Outcome: Progressing   Problem: Clinical Measurements: Goal: Ability to maintain clinical measurements within normal limits will improve Outcome: Progressing Goal: Will remain free from infection Outcome: Progressing Goal: Diagnostic test results will improve Outcome: Progressing Goal: Respiratory complications will improve Outcome: Progressing Goal: Cardiovascular complication will be avoided Outcome: Progressing   Problem: Activity: Goal: Risk for activity intolerance will decrease Outcome: Progressing   Problem: Nutrition: Goal: Adequate nutrition will be maintained Outcome: Progressing   Problem: Coping: Goal: Level of anxiety will decrease Outcome: Progressing   Problem: Elimination: Goal: Will not experience complications related to bowel motility Outcome: Progressing Goal: Will not experience complications related to urinary retention Outcome: Progressing   Problem: Pain Managment: Goal: General experience of comfort will improve Outcome: Progressing   Problem: Safety: Goal: Ability to remain free from injury will improve Outcome: Progressing   Problem: Skin Integrity: Goal: Risk for impaired skin integrity will decrease Outcome: Progressing   Problem: Education: Goal: Ability to describe self-care measures that may prevent or decrease complications (Diabetes Survival Skills Education) will improve Outcome: Progressing Goal: Individualized Educational Video(s) Outcome: Progressing   Problem: Coping: Goal: Ability to adjust to condition or change in health will improve Outcome: Progressing   Problem: Fluid Volume: Goal: Ability to maintain a balanced intake and output will improve Outcome: Progressing   Problem: Health Behavior/Discharge Planning: Goal: Ability to identify and utilize available resources and services will improve Outcome: Progressing Goal:  Ability to manage health-related needs will improve Outcome: Progressing   Problem: Metabolic: Goal: Ability to maintain appropriate glucose levels will improve Outcome: Progressing   Problem: Nutritional: Goal: Maintenance of adequate nutrition will improve Outcome: Progressing Goal: Progress toward achieving an optimal weight will improve Outcome: Progressing   Problem: Skin Integrity: Goal: Risk for impaired skin integrity will decrease Outcome: Progressing   Problem: Tissue Perfusion: Goal: Adequacy of tissue perfusion will improve Outcome: Progressing   Problem: Education: Goal: Knowledge about tracheostomy care/management will improve Outcome: Progressing   Problem: Activity: Goal:  Ability to tolerate increased activity will improve Outcome: Progressing

## 2021-11-02 NOTE — Progress Notes (Addendum)
NAME:  [name], MRN:  [id number], DOB:  Sep 25, 1960, LOS: 9 ADMISSION DATE:  09/28/2021, CONSULTATION DATE:  09/29/2021 REFERRING MD:  Kipp Brood - TCTS CHIEF COMPLAINT: Dyspnea   History of Present Illness:  61 year old man who underwent CABG on 8/3, readmitted on 8/19 in setting of dyspnea with a pericardial effusion requiring pericardial window via bedside sternotomy. Also with bilateral pleural effusions. Sustained PEA arrest on 8/20, briefly required [facility] ECMO which was discontinued on 8/28, Impella placed 8/28, started on CRRT.  Hypoxemia, repeat cardiac arrest on 8/30. Underwent mediastinal washout/sternotomy closure 9/2. Impella removed and tracheostomy completed 9/7.  Pertinent Medical History:  CAD s/p CABG 8/3 DM2 Diverticulosis GERD Hyperlipidemia Hypertension History of stroke Tobacco Abuse  Significant Hospital Events: Including procedures, antibiotic start and stop dates in addition to other pertinent events   8/19 Admitted, s/p pericardial window 8/20 PEA cardiac arrest, left pigtail chest tube placement, PEA cardiac arrest due to tamponade, bedside sternotomy with pericardial drains placed. 8/21 Return to OR for overnight bleeding.  8/22 Minimal chest tube output.  Atrial fibrillation, controled with amiodarone.  8/22 TEE showed EF 35% at baseline, which improved significantly with decreasing ECMO flow.  8/23 Tolerated diuresis  8/24 Mediastinal washout, removal of hematoma 8/25 Ongoing bleeding issues from posterior mediastinum 8/28 Decannulated and off ECMO, 5.5 Impella placed, L femoral HD cath placed and started on CRRT 8/30 Hypoxia and hypotension followed by brief arrest, gentle L lateral chest compressions performed and ROSC in roughly 1 minute. 8/31 Remains deeply sedated on ventilator with open chest.  No acute events overnight. 9/1 No acute events overnight, remains on ventilator deep sedation.  Tolerating aggressive volume removal per CRRT 9/2 Sternotomy  closure 9/3 Wean versed 9/4 FOB, low grade temps, notable thick mucus/frequent suctioning 9/5 Vomiting, ?mucus plugging. PSV 5-6 hours on 10/5 9/6 Impella down to p3 9/7 Impella removal and trach. PRBC and DDAVP in OR  9/8 Low dose epi, weaned on 8/8 9/9 RIJ HD catheter placed 9/11 Started on ATC at 28% and tolerating well 9/12 Remains on 28% ATC 9/13 SB/Junction rhythm with associated hypotension overnight requiring transient pressor escalation. Pressor requirement down in AM. WBC count 23 (18), Co-ox 36%. Resp/Blood Cx. 9/14 Mild bradycardia/relative hypotension with Precedex. Remains on Epi 61mcg, Vaso 0.03. Resp Cx with few gram+ cocci (clusters), yeast. BCx NGTD. 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 Co-ox 48%. CRRT stopped. 9/15 WBC continues to downtrend. Hgb 7.6. Net +1.49L/24H. CRRT resumed. Co-ox 51%. NE started to help with Epi wean. 9/16, Co. oximetry improved, WBC trending down, weaning pressors 9/19 on trach collar for 96 hours.  Weaning off vasopressors.  Trial off CRRT today.  Episode of overnight emesis. 9/20 more awake. CRRT off 9/21 cuffless tracheostomy. Do well with PMV 9/22 failed swallow evaluation  Interim History / Subjective:  Overnight back on vasopressors. This AM up to 7 mcg/min with iHD  Objective:  Blood pressure (!) 101/53, pulse 89, temperature 98.9 F (37.2 C), temperature source Oral, resp. rate (!) 21, height 5\' 7"  (1.702 m), weight 84.6 kg, SpO2 99 %. CVP:  [2 mmHg-11 mmHg] 3 mmHg  FiO2 (%):  [28 %] 28 %   Intake/Output Summary (Last 24 hours) at 11/02/2021 0848 Last data filed at 11/02/2021 0600 Gross per 24 hour  Intake 3139.17 ml  Output 1000 ml  Net 2139.17 ml   Filed Weights   10/31/21 2145 11/01/21 0500 11/02/21 0500  Weight: 86.4 kg 84.1 kg 84.6 kg  Physical Examination: General: Adult male, lying in bed, no distress  HEENT: Trach in place > clean/dry   Neuro: opens eyes with physical  stimulation, follows commands, pupils intact and reactive  CV: RRR, HR 85, no mRG PULM: Coarse breath sounds, no use of accessory muscles  GI: Soft, non tender, active bowel sounds, sligtly distended  Extremities: -edema   Ancillary tests personally reviewed:     Assessment & Plan:   Acute metabolic encephalopathy.   - Agitated at times with poor impulse control. Plan -Continue to wean all sedatives off: Oxycodone taper in place, Seroquel nightly only, Continue low-dose clonazepam  Hemorrhagic pericarditis with tamponade PEA arrest due to tamponade, s/p emergent sternotomy Cardiogenic & hemorrhagic shock, s/p VA ECMO (decannulated 8/29), s/p Impella (removed 9/7) -Off all vasopressors. EF now normal. CAD s/p CABG x3 Afib  Plan -Per CTS -Cardiac Monitoring  -Maintain MAP >65 (of 7 mcg/min levophed) -Continue scheduled midodrine 15 mg TID  Leukocytosis, afebrile  Completed course of daptomycin, completed and meropenem Plan -Finish off 14-day course of micafungin as patient at high risk for fungemia. -CXR pending  -PAN Culture if becomes febrile  -Obtain Upper/Lower U/S to r/o DVT  -Would benefit from a line holiday after dialysis > will work on PIV.  Acute respiratory failure with hypoxemia and hypercarbia S/p Tracheostomy  - down-sized to cuffless trach 9/22 Plan -Continue trach collar -Passy-Muir valve. -Given inability to swallow and adequate clear secretions will hold on capping trials for now.  AKI with anuria  Hypophosphatemia Plan -Transitioned to IHD. Plan for dialysis today -Attempt line holiday Saturday 9/23 after dialysis if able to obtain PIV and vasopressors requirements <10  -Permcath placement once leukocytosis improving.    DM II with hyperglycemia Plan -Trend Glucose -SSI   At Risk Malnutrition  -TF via Cortrak -ST following   Sacral pressure wound - WOC following  Severe physical deconditioning -Continue physical therapy.  Best  Practice (right click and "Reselect all SmartList Selections" daily)  Diet/type: tubefeeds  DVT prophylaxis: prophylactic heparin  GI prophylaxis: PPI Lines: Dialysis catheter only.  For line holiday on Sunday. Foley:  N/A Code Status:  full code Last date of multidisciplinary goals of care discussion patient updated daily.  CRITICAL CARE Performed by: Omar Person   Total critical care time: 35 minutes  Critical care time was exclusive of separately billable procedures and treating other patients.  Critical care was necessary to treat or prevent imminent or life-threatening deterioration.  Critical care was time spent personally by me on the following activities: development of treatment plan with patient and/or surrogate as well as nursing, discussions with consultants, evaluation of patient's response to treatment, examination of patient, obtaining history from patient or surrogate, ordering and performing treatments and interventions, ordering and review of laboratory studies, ordering and review of radiographic studies, pulse oximetry and re-evaluation of patient's condition.      Omar Person, NP [facility] 11/02/21 8:48 AM  Please see [email or web address] for contact info details.  From 7A-7P if no response, please call [phone] After hours, please call ELink [phone]

## 2021-11-02 NOTE — Progress Notes (Signed)
[facility] Progress Note Patient Name: [name] DOB: [birth year] MRN: [id number]   Date of Service  11/02/2021  HPI/Events of Note  Notified of hypotension.  Pt given albumin with little improvement.   BP 82/43, HR 74, RR 22, O2 sats 100% on camera assessment.   eICU Interventions  Levophed restarted. Continue to titrate to keep MAP >65.     Intervention Category Intermediate Interventions: Hypotension - evaluation and management  Elsie Lincoln 11/02/2021, 12:51 AM

## 2021-11-02 NOTE — Progress Notes (Signed)
Patient ID: [name], male   DOB: 1960/09/08, 61 y.o.   MRN: [id number]  TCTS Evening Rounds:  Hemodynamically stable on 5 mcg NE.  Tolerated HD well.  He is awake alert and conversant this afternoon. Says he is miserable.

## 2021-11-02 NOTE — Progress Notes (Signed)
An USGPIV (ultrasound guided PIV) has been placed for short-term vasopressor infusion. A correctly placed ivWatch must be used when administering Vasopressors. Should this treatment be needed beyond 72 hours, central line access should be obtained.  It will be the responsibility of the bedside nurse to follow best practice to prevent extravasations.   ?

## 2021-11-02 NOTE — Progress Notes (Signed)
Subjective:  had some hypotension that req restart of low dose pressors overnight-  CVP now reading lower than it has in the past - no UOP  Objective Vital signs in last 24 hours: Vitals:   11/02/21 0300 11/02/21 0312 11/02/21 0330 11/02/21 0400  BP: (!) 99/46 94/66 (!) 99/46 (!) 107/49  Pulse: 82 81 83 83  Resp: (!) 23 (!) 22 (!) 24 (!) 21  Temp: 98.1 F (36.7 C)     TempSrc: Axillary     SpO2: 99% 98% 100% 100%  Weight:      Height:       Weight change:   Intake/Output Summary (Last 24 hours) at 11/02/2021 2595 Last data filed at 11/02/2021 0417 Gross per 24 hour  Intake 3089.17 ml  Output 800 ml  Net 2289.17 ml   Assessment/Plan:     61 y.o. yo male  with HTN, HLD, DM, A-fib, stroke, CKD, CAD status post CABG on 8/3 presented with SOB, found cardiac tamponade, course complicated by cardiac arrest, use of ECMO, seen as a consultation for the evaluation of AKI    #Acute kidney injury on CKD IIIa.  Ischemic ATN due to cardiogenic shock/cardiac arrest.  Started CRRT on 8/28 for decreasing urine output and fluid/volume management.  -  stopped CRRT on 9/19-  tolerated IHD on 9/21-  plan for it again today-  pressors have been resumed overnight-  will still try to do IHD, will change UF to keep even given low CVP and good O2 req  Appreciate IR input-  following for an opportunity to place [facility]-   reminded  them 9/22 especially since cath did not work so great yesterday    #Cardiac tamponade/hemorrhagic pericarditis status post pericardial drain placement with subsequent ECMO placement.  decannulated  with placement of Impella.  S/p closure of the sternum and mediastinal washout on 9/2.   #Cardiogenic shock/cardiac arrest: Was on ECMO.  pressors per primary team.  Impella out on 9/7- remarkable improvement -  now on midodrine   #Acute hypoxic respiratory failure:  improving - now on TC.  optimizing volume with CRRT -  euvolemic at present -  CVP actually reading low-  no volume removal  with HD today    #Acute blood loss anemia: Monitor hemoglobin and transfuse as needed by ICU team.  Aranesp   Increased dose to 100 mcg weekly but hgb drifting down slowly        Louis Meckel    Labs: Basic Metabolic Panel: Recent Labs  Lab 10/31/21 1624 11/01/21 0344 11/02/21 0439  NA 141 136 131*  K 5.6* 3.8 3.5  CL 109 94* 92*  CO2 19* 25 23  GLUCOSE 121* 107* 219*  BUN 115* 65* 114*  CREATININE 6.21* 3.44* 5.53*  CALCIUM 9.0 9.2 9.1  PHOS 7.0* 4.4 6.5*   Liver Function Tests: Recent Labs  Lab 10/31/21 1624 11/01/21 0344 11/02/21 0439  ALBUMIN 2.2* 2.6* 2.6*   No results for input(s): "LIPASE", "AMYLASE" in the last 168 hours. No results for input(s): "AMMONIA" in the last 168 hours. CBC: Recent Labs  Lab 10/29/21 0441 10/30/21 0824 10/31/21 0506 11/01/21 0344 11/02/21 0439  WBC 23.2* 26.5* 25.5* 24.1* 24.7*  HGB 8.2* 8.1* 8.0* 7.5* 7.3*  HCT 28.4* 27.8* 27.4* 24.9* 24.0*  MCV 100.4* 101.8* 100.0 98.4 99.2  PLT 350 358 390 426* 445*   Cardiac Enzymes: No results for input(s): "CKTOTAL", "CKMB", "CKMBINDEX", "TROPONINI" in the last 168 hours.  CBG: Recent Labs  Lab 11/01/21 1112 11/01/21 1558 11/01/21 1941 11/01/21 2340 11/02/21 0311  GLUCAP 150* 166* 151* 164* 178*    Iron Studies: No results for input(s): "IRON", "TIBC", "TRANSFERRIN", "FERRITIN" in the last 72 hours. Studies/Results: DG Abd Portable 1V  Result Date: 10/31/2021 CLINICAL DATA:  Feeding tube placement EXAM: PORTABLE ABDOMEN - 1 VIEW COMPARISON:  10/29/2021 FINDINGS: Feeding tube coiled in the stomach with the tip in the region of the gastric antrum or proximal duodenum. This has been pulled back since the prior study. Normal bowel gas pattern. IMPRESSION: Feeding tube tip in the region of the gastric antrum or proximal duodenum. Electronically Signed   By: Franchot Gallo M.D.   On: 10/31/2021 16:43   Medications: Infusions:  sodium chloride 10 mL/hr at 11/02/21 0417    sodium chloride     albumin human 12.5 g (11/01/21 2248)   albumin human     anticoagulant sodium citrate     feeding supplement (VITAL 1.5 CAL) 60 mL/hr at 11/02/21 0417   micafungin (MYCAMINE) 100 mg in sodium chloride 0.9 % 100 mL IVPB Stopped (11/01/21 1323)   norepinephrine (LEVOPHED) Adult infusion 6 mcg/min (11/02/21 0417)    Scheduled Medications:  sodium chloride   Intravenous Once   acetaminophen (TYLENOL) oral liquid 160 mg/5 mL  650 mg Per Tube Q6H   ascorbic acid  250 mg Per Tube BID   aspirin  81 mg Per Tube Daily   Chlorhexidine Gluconate Cloth  6 each Topical Daily   Chlorhexidine Gluconate Cloth  6 each Topical Q0600   Chlorhexidine Gluconate Cloth  6 each Topical Q0600   collagenase   Topical Daily   darbepoetin (ARANESP) injection - NON-DIALYSIS  100 mcg Subcutaneous Q Mon-1800   feeding supplement (PROSource TF20)  60 mL Per Tube TID   fiber  1 packet Per Tube BID   folic acid  1 mg Per Tube Daily   Gerhardt's butt cream   Topical Daily   heparin injection (subcutaneous)  5,000 Units Subcutaneous Q8H   insulin aspart  0-20 Units Subcutaneous Q4H   insulin aspart  2 Units Subcutaneous Q4H   insulin glargine-yfgn  10 Units Subcutaneous QHS   midodrine  15 mg Per Tube TID WC   multivitamin  1 tablet Per Tube QHS   nutrition supplement (JUVEN)  1 packet Per Tube BID BM   mouth rinse  15 mL Mouth Rinse 4 times per day   oxyCODONE  5 mg Per Tube Q6H   pantoprazole  40 mg Per Tube BID   QUEtiapine  50 mg Per Tube QHS   sodium chloride flush  10-40 mL Intracatheter Q12H   sodium chloride flush  10-40 mL Intracatheter Q12H   sodium chloride flush  3 mL Intravenous Q12H   thiamine  100 mg Per Tube Daily   zinc sulfate  220 mg Per Tube Daily    have reviewed scheduled and prn medications.  Physical Exam: General: resting on TC this AM-   has mittens on Heart: RRR Lungs: mostly clear Abdomen: slightly distended Extremities: min edema Dialysis Access: right IJ  dialysis cath-  72 days old     11/02/2021,6:21 AM  LOS: 35 days

## 2021-11-03 ENCOUNTER — Inpatient Hospital Stay (HOSPITAL_BASED_OUTPATIENT_CLINIC_OR_DEPARTMENT_OTHER): Payer: Self-pay

## 2021-11-03 DIAGNOSIS — R609 Edema, unspecified: Secondary | ICD-10-CM

## 2021-11-03 LAB — RENAL FUNCTION PANEL
Albumin: 2.7 g/dL — ABNORMAL LOW (ref 3.5–5.0)
Anion gap: 16 — ABNORMAL HIGH (ref 5–15)
BUN: 77 mg/dL — ABNORMAL HIGH (ref 8–23)
CO2: 25 mmol/L (ref 22–32)
Calcium: 9.1 mg/dL (ref 8.9–10.3)
Chloride: 93 mmol/L — ABNORMAL LOW (ref 98–111)
Creatinine, Ser: 3.79 mg/dL — ABNORMAL HIGH (ref 0.61–1.24)
GFR, Estimated: 17 mL/min — ABNORMAL LOW (ref >60)
Glucose, Bld: 175 mg/dL — ABNORMAL HIGH (ref 70–99)
Phosphorus: 2.7 mg/dL (ref 2.5–4.6)
Potassium: 4.1 mmol/L (ref 3.5–5.1)
Sodium: 134 mmol/L — ABNORMAL LOW (ref 135–145)

## 2021-11-03 LAB — CBC
HCT: 23.7 % — ABNORMAL LOW (ref 39.0–52.0)
Hemoglobin: 7 g/dL — ABNORMAL LOW (ref 13.0–17.0)
MCH: 29.8 pg (ref 26.0–34.0)
MCHC: 29.5 g/dL — ABNORMAL LOW (ref 30.0–36.0)
MCV: 100.9 fL — ABNORMAL HIGH (ref 80.0–100.0)
Platelets: 437 10*3/uL — ABNORMAL HIGH (ref 150–400)
RBC: 2.35 MIL/uL — ABNORMAL LOW (ref 4.22–5.81)
RDW: 23.4 % — ABNORMAL HIGH (ref 11.5–15.5)
WBC: 24.2 10*3/uL — ABNORMAL HIGH (ref 4.0–10.5)
nRBC: 2.9 % — ABNORMAL HIGH (ref 0.0–0.2)

## 2021-11-03 LAB — GLUCOSE, CAPILLARY
Glucose-Capillary: 155 mg/dL — ABNORMAL HIGH (ref 70–99)
Glucose-Capillary: 164 mg/dL — ABNORMAL HIGH (ref 70–99)
Glucose-Capillary: 164 mg/dL — ABNORMAL HIGH (ref 70–99)
Glucose-Capillary: 171 mg/dL — ABNORMAL HIGH (ref 70–99)
Glucose-Capillary: 190 mg/dL — ABNORMAL HIGH (ref 70–99)
Glucose-Capillary: 210 mg/dL — ABNORMAL HIGH (ref 70–99)

## 2021-11-03 LAB — MAGNESIUM: Magnesium: 2 mg/dL (ref 1.7–2.4)

## 2021-11-03 MED ORDER — ACETAMINOPHEN 160 MG/5ML PO SOLN
650.0000 mg | Freq: Four times a day (QID) | ORAL | Status: DC | PRN
  Administered 2021-11-09 – 2021-11-16 (×3): 650 mg
  Filled 2021-11-03 (×4): qty 20.3

## 2021-11-03 MED ORDER — CEFAZOLIN SODIUM-DEXTROSE 2-4 GM/100ML-% IV SOLN: 2.0000 g | INTRAVENOUS | Status: AC

## 2021-11-03 MED ORDER — OXYCODONE HCL 5 MG PO TABS
5.0000 mg | ORAL_TABLET | Freq: Four times a day (QID) | ORAL | Status: DC | PRN
  Administered 2021-11-03 – 2021-11-22 (×26): 5 mg
  Filled 2021-11-03 (×27): qty 1

## 2021-11-03 NOTE — Progress Notes (Signed)
RT capped pt's trach and pt is tolerating well. RT will monitor.

## 2021-11-03 NOTE — Progress Notes (Addendum)
Subjective:  completed HD yesterday for just clearance-  no volume removal-  no UOP-    pt c/o pain in his butt-  is really getting frustrated at being here for so long-  still on just a little bit of levo  Objective Vital signs in last 24 hours: Vitals:   11/03/21 0500 11/03/21 0530 11/03/21 0600 11/03/21 0630  BP: 120/63 (!) 104/55 (!) 94/41 101/74  Pulse: 86 85 84 89  Resp:  (!) 23 (!) 23 15  Temp:      TempSrc:      SpO2: 100% 100% 100% 100%  Weight: 84.9 kg     Height:       Weight change: 0 kg  Intake/Output Summary (Last 24 hours) at 11/03/2021 0720 Last data filed at 11/03/2021 0601 Gross per 24 hour  Intake 2638.65 ml  Output 0 ml  Net 2638.65 ml   Assessment/Plan:     61 y.o. yo male  with HTN, HLD, DM, A-fib, stroke, CKD, CAD status post CABG on 8/3 presented with SOB, found cardiac tamponade, course complicated by cardiac arrest, use of ECMO, seen as a consultation for AKI    #Acute kidney injury on CKD IIIa.  Ischemic ATN due to cardiogenic shock/cardiac arrest.  Started CRRT on 8/28 for decreasing urine output and fluid/volume management.  -  stopped CRRT on 9/19-  tolerated IHD on 9/21 and 9/23 ( but pressors were running, kept even ) tentatively plan for next HD on Tuesday to keep on TTS schedule but may need to run tomorrow if BUN is very high   Appreciate IR input-  following for an opportunity to place [facility]-   reminded  them 9/22 especially since cath did not work so great yesterday-   no word yet    #Cardiac tamponade/hemorrhagic pericarditis status post pericardial drain placement with subsequent ECMO placement.  decannulated  with placement of Impella.  S/p closure of the sternum and mediastinal washout on 9/2.   #Cardiogenic shock/cardiac arrest: Was on ECMO.  pressors per primary team.  Impella out on 9/7- remarkable improvement -  now on midodrine- req restart of pressors 9/22-  very low dose-  feel like is euvolemic still   #Acute hypoxic respiratory  failure:  improving - now on TC.  optimizing volume with CRRT -  euvolemic at present -  CVP actually reading low-  no volume removal with HD 9/23- feel volume is OK right now   #Acute blood loss anemia: Monitor hemoglobin and transfuse as needed by ICU team.  Aranesp   Increased dose to 100 mcg weekly but hgb drifting down slowly   -  last transfusion 9/7 but approaching need-  will check iron stores tomorrow as well     Louis Meckel    Labs: Basic Metabolic Panel: Recent Labs  Lab 11/01/21 0344 11/02/21 0439 11/03/21 0641  NA 136 131* 134*  K 3.8 3.5 4.1  CL 94* 92* 93*  CO2 25 23 25   GLUCOSE 107* 219* 175*  BUN 65* 114* 77*  CREATININE 3.44* 5.53* 3.79*  CALCIUM 9.2 9.1 9.1  PHOS 4.4 6.5* 2.7   Liver Function Tests: Recent Labs  Lab 10/31/21 1624 11/01/21 0344 11/02/21 0439  ALBUMIN 2.2* 2.6* 2.6*   No results for input(s): "LIPASE", "AMYLASE" in the last 168 hours. No results for input(s): "AMMONIA" in the last 168 hours. CBC: Recent Labs  Lab 10/30/21 0824 10/31/21 0506 11/01/21 0344 11/02/21 0439 11/03/21 0641  WBC 26.5* 25.5* 24.1*  24.7* 24.2*  HGB 8.1* 8.0* 7.5* 7.3* 7.0*  HCT 27.8* 27.4* 24.9* 24.0* 23.7*  MCV 101.8* 100.0 98.4 99.2 100.9*  PLT 358 390 426* 445* 437*   Cardiac Enzymes: No results for input(s): "CKTOTAL", "CKMB", "CKMBINDEX", "TROPONINI" in the last 168 hours.  CBG: Recent Labs  Lab 11/02/21 1141 11/02/21 1530 11/02/21 1933 11/02/21 2311 11/03/21 0316  GLUCAP 155* 233* 157* 237* 164*    Iron Studies: No results for input(s): "IRON", "TIBC", "TRANSFERRIN", "FERRITIN" in the last 72 hours. Studies/Results: DG CHEST PORT 1 VIEW  Result Date: 11/02/2021 CLINICAL DATA:  Respiratory failure. EXAM: PORTABLE CHEST 1 VIEW COMPARISON:  October 29, 2021 FINDINGS: Stable support apparatus. Enlarged cardiac silhouette. Bibasilar, left greater than right streaky airspace opacities. Osseous structures are without acute  abnormality. Soft tissues are grossly normal. IMPRESSION: Bibasilar, left greater than right streaky airspace opacities may represent atelectasis or airspace consolidation. Electronically Signed   By: Fidela Salisbury M.D.   On: 11/02/2021 14:26   Medications: Infusions:  sodium chloride 10 mL/hr at 11/03/21 0601   sodium chloride 10 mL/hr at 11/03/21 0601   albumin human 12.5 g (11/01/21 2248)   anticoagulant sodium citrate     feeding supplement (VITAL 1.5 CAL) 60 mL/hr at 11/03/21 0601   micafungin (MYCAMINE) 100 mg in sodium chloride 0.9 % 100 mL IVPB Stopped (11/02/21 1302)   norepinephrine (LEVOPHED) Adult infusion 4 mcg/min (11/03/21 0601)    Scheduled Medications:  acetaminophen (TYLENOL) oral liquid 160 mg/5 mL  650 mg Per Tube Q6H   ascorbic acid  250 mg Per Tube BID   aspirin  81 mg Per Tube Daily   Chlorhexidine Gluconate Cloth  6 each Topical Daily   collagenase   Topical Daily   darbepoetin (ARANESP) injection - NON-DIALYSIS  100 mcg Subcutaneous Q Mon-1800   feeding supplement (PROSource TF20)  60 mL Per Tube TID   fiber  1 packet Per Tube BID   folic acid  1 mg Per Tube Daily   Gerhardt's butt cream   Topical Daily   heparin injection (subcutaneous)  5,000 Units Subcutaneous Q8H   insulin aspart  0-20 Units Subcutaneous Q4H   insulin aspart  2 Units Subcutaneous Q4H   insulin glargine-yfgn  10 Units Subcutaneous QHS   midodrine  15 mg Per Tube TID WC   multivitamin  1 tablet Per Tube QHS   nutrition supplement (JUVEN)  1 packet Per Tube BID BM   mouth rinse  15 mL Mouth Rinse 4 times per day   oxyCODONE  5 mg Per Tube Q6H   pantoprazole  40 mg Per Tube BID   QUEtiapine  50 mg Per Tube QHS   sodium chloride flush  3 mL Intravenous Q12H   thiamine  100 mg Per Tube Daily   zinc sulfate  220 mg Per Tube Daily    have reviewed scheduled and prn medications.  Physical Exam: General:  on TC this AM-   verbal today -  really feeling the frustration of being here so  long Heart: RRR Lungs: mostly clear Abdomen: slightly distended Extremities: no edema Dialysis Access: right IJ dialysis cath-  17 days old     11/03/2021,7:20 AM  LOS: 36 days

## 2021-11-03 NOTE — Consult Note (Signed)
Chief Complaint: Patient was seen in consultation today for tunneled dialysis catheter placement Chief Complaint  Patient presents with   Chest Pain    Referring Physician(s): [place]  Supervising Physician: Jacqulynn Cadet  Patient Status: [facility] - In-pt  History of Present Illness: [name] is a 61 y.o. male with past medical history of hypertension, hyperlipidemia, diabetes, A-fib, stroke, chronic kidney disease, coronary artery disease with prior CABG on 8/3 who presented to [facility] with dyspnea, cardiac tamponade and course complicated by cardiac arrest , use of ECMO, status post pericardial drain placement, closure of the sternum and mediastinal washout on 9/2.  Now 17 days out from Impella left ventricular assist device removal and percutaneous tracheostomy.  Also with acute on chronic kidney injury, ischemic ATN due to cardiogenic shock/cardiac arrest.  Started on CRRT on 8/28, stopped 9/19.  Now on IHD.  Existing temp cath not functioning well and request now received for tunneled dialysis catheter placement.  He is currently afebrile, WBC 24.2 (24.7), hemoglobin 7, platelets 437k, creat 3.79.  Recent blood culture negative.  On low-dose norepinephrine and midodrine for blood pressure support.  Per TCTS, WBC count not likely to return to normal very quickly. Right IJ temp cath removed yesterday.   Past Medical History:  Diagnosis Date   AKI (acute kidney injury) ([place])    pt unaware of this   Anginal pain ([place])    CAD (coronary artery disease)    a. 03/2015 NSTEMI: LHC with severe 3V CAD  (70% mid RCA, 95% OM1, 90% distal LCx, 90% OM3, 80% prox LAD and 90% ost D1) s/p DES to mLAD w/ small dissction Rx with DES, staged ost Ramus PCI/DES and dLCx s/p PCI/DES    Chest pain 12/24/2020   Diabetes mellitus type 2 in obese [facility])    Diverticulosis    Dyspnea    Dyspnea on exertion 03/16/2015   Dyspnea on exertion   Family history of adverse reaction to anesthesia     patient father- pt states after anesthesia his father "developed dementia"   GERD (gastroesophageal reflux disease)    Hypercholesteremia    Hypertension associated with diabetes ([place]) 03/16/2015   hypertension   NSTEMI (non-ST elevated myocardial infarction) ([place]) 03/17/2015   Obesity    Stroke ([place]) 2022   pt states he had a "mini stroke" during cardiac catheterization   Tobacco abuse     Past Surgical History:  Procedure Laterality Date   APPLICATION OF WOUND VAC N/A 10/03/2021   Procedure: APPLICATION OF WOUND VAC;  Surgeon: Dahlia Byes, MD;  Location: [place];  Service: Thoracic;  Laterality: N/A;   CANNULATION FOR ECMO (EXTRACORPOREAL MEMBRANE OXYGENATION) N/A 09/29/2021   Procedure: CANNULATION FOR ECMO (EXTRACORPOREAL MEMBRANE OXYGENATION);  Surgeon: Lajuana Matte, MD;  Location: [place];  Service: Open Heart Surgery;  Laterality: N/A;   CARDIAC CATHETERIZATION N/A 03/19/2015   Procedure: Left Heart Cath and Coronary Angiography;  Surgeon: Lorretta Harp, MD;  Location: [place] CV LAB;  Service: Cardiovascular;  Laterality: N/A;   CARDIAC CATHETERIZATION N/A 03/20/2015   Procedure: Coronary Stent Intervention;  Surgeon: Lorretta Harp, MD;  Location: [place] [facility];  Service: Cardiovascular;  Laterality: N/A;   CARDIAC CATHETERIZATION N/A 03/22/2015   Procedure: Coronary Stent Intervention;  Surgeon: Lorretta Harp, MD;  Location: [place] CV [facility];  Service: Cardiovascular;  Laterality: N/A;   CORONARY ARTERY BYPASS GRAFT N/A 09/12/2021   Procedure: CORONARY ARTERY BYPASS GRAFTING (CABG) X 5 USING LEFT  INTERNAL MAMMARY ARTERY AND ENDOSCOPICALLY HARVESTED RIGHT GREATER SAPHENOUS VEIN.;  Surgeon: [name], MD;  [facility];  Service: Open Heart Surgery;  Laterality: N/A;   EXPLORATION POST OPERATIVE OPEN HEART N/A 09/30/2021   Procedure: EXPLORATION POST OPERATIVE OPEN HEART WASHOUT;  Surgeon: Lajuana Matte, MD;  Location: [place];  Service: Open  Heart Surgery;  Laterality: N/A;   LEFT HEART CATH AND CORONARY ANGIOGRAPHY N/A 12/25/2020   Procedure: LEFT HEART CATH AND CORONARY ANGIOGRAPHY;  Surgeon: Troy Sine, MD;  Location: [place] CV LAB;  Service: Cardiovascular;  Laterality: N/A;   LEFT HEART CATH AND CORONARY ANGIOGRAPHY N/A 12/26/2020   Procedure: LEFT HEART CATH AND CORONARY ANGIOGRAPHY;  Surgeon: Troy Sine, MD;  Location: [place] [facility];  Service: Cardiovascular;  Laterality: N/A;   MEDIASTINAL EXPLORATION N/A 10/03/2021   Procedure: MEDIASTINAL WASHOUT;  Surgeon: Dahlia Byes, MD;  Location: [place];  Service: Thoracic;  Laterality: N/A;  PUMP STANDBY   MEDIASTINAL EXPLORATION N/A 10/12/2021   Procedure: MEDIASTINAL WASHOUT;  Surgeon: [name], MD;  Location: [place];  Service: Thoracic;  Laterality: N/A;   PERCUTANEOUS TRACHEOSTOMY N/A 10/17/2021   Procedure: PERCUTANEOUS TRACHEOSTOMY USING SHILEY FLEXIBLE 8 mm CUFFED [place].;  Surgeon: Candee Furbish, MD;  Location: [place];  Service: Pulmonary;  Laterality: N/A;  Percutaneous tracheostomy   PERICARDIAL WINDOW Right 09/28/2021   Procedure: PERICARDIAL WINDOW;  Surgeon: Lajuana Matte, MD;  Location: [place];  Service: Thoracic;  Laterality: Right;  Right VATS.  Lazy lateral.  double lumen ET tube   PLACEMENT OF IMPELLA LEFT VENTRICULAR ASSIST DEVICE N/A 10/07/2021   Procedure: INSERTION OF RIGHT AXILLARY IMPELLA, DECANNULATION OF ECMO, AND MEDIASTINAL WASHOUT;  Surgeon: [name], MD;  Location: [place];  Service: Open Heart Surgery;  Laterality: N/A;  Open right axillary with 8 mm Hemashield Platinum Vascular graft.   REMOVAL OF IMPELLA LEFT VENTRICULAR ASSIST DEVICE Right 10/17/2021   Procedure: REMOVAL OF IMPELLA LEFT VENTRICULAR ASSIST DEVICE;  Surgeon: [name], MD;  Location: [place];  Service: Open Heart Surgery;  Laterality: Right;   STERNAL CLOSURE N/A 10/12/2021   Procedure: STERNAL CLOSURE;  Surgeon: [name], MD;  [facility];   Service: Thoracic;  Laterality: N/A;   TEE WITHOUT CARDIOVERSION N/A 09/12/2021   Procedure: TRANSESOPHAGEAL ECHOCARDIOGRAM (TEE);  Surgeon: [name], MD;  Location: [place];  Service: Open Heart Surgery;  Laterality: N/A;   TEE WITHOUT CARDIOVERSION N/A 09/29/2021   Procedure: TRANSESOPHAGEAL ECHOCARDIOGRAM (TEE);  Surgeon: Lajuana Matte, MD;  Location: [place];  Service: Open Heart Surgery;  Laterality: N/A;   TEE WITHOUT CARDIOVERSION N/A 10/03/2021   Procedure: TRANSESOPHAGEAL ECHOCARDIOGRAM (TEE);  Surgeon: Dahlia Byes, MD;  Location: [place];  Service: Thoracic;  Laterality: N/A;   TEE WITHOUT CARDIOVERSION  10/07/2021   Procedure: TRANSESOPHAGEAL ECHOCARDIOGRAM (TEE);  Surgeon: [name], MD;  Location: [facility] OR;  Service: Open Heart Surgery;;   TEE WITHOUT CARDIOVERSION N/A 10/12/2021   Procedure: TRANSESOPHAGEAL ECHOCARDIOGRAM (TEE);  Surgeon: [name], MD;  Location: [facility] OR;  Service: Thoracic;  Laterality: N/A;   TEE WITHOUT CARDIOVERSION N/A 10/17/2021   Procedure: TRANSESOPHAGEAL ECHOCARDIOGRAM (TEE);  Surgeon: [name], MD;  Location: [place];  Service: Open Heart Surgery;  Laterality: N/A;   TESTICLE SURGERY  1970   pt states testicle was ascended and had to be "pulled down"   TONSILLECTOMY     as a child  Allergies: Banatrol tf  Medications: Prior to Admission medications   Medication Sig Start Date End Date Taking? Authorizing Provider  amiodarone (PACERONE) 200 MG tablet Take 1 tablet by mouth  twice daily for 2 days then take 1 tablet daily thereafter Patient taking differently: Take 200 mg by mouth daily. 09/20/21  Yes Stehler, Pricilla Larsson, PA-C  amLODipine (NORVASC) 10 MG tablet Take 1 tablet (10 mg total) by mouth daily. 08/21/21  Yes Charlott Rakes, MD  apixaban (ELIQUIS) 5 MG TABS tablet Take 1 tablet (5 mg total) by mouth 2 (two) times daily. 09/20/21  Yes Stehler, Pricilla Larsson, PA-C  aspirin 325 MG tablet Take 325 mg by mouth daily.   Yes [provider]  atorvastatin (LIPITOR) 80 MG tablet Take 1 tablet (80 mg total) by mouth daily. 09/06/21  Yes Charlott Rakes, MD  carvedilol (COREG) 25 MG tablet Take 1 tablet (25 mg total) by mouth 2 (two) times daily with a meal. 09/20/21  Yes Stehler, Pricilla Larsson, PA-C  Dulaglutide (TRULICITY) 1.5 NW/2.9FA SOPN Inject 1.5 mg into the skin once a week. 06/10/21  Yes Charlott Rakes, MD  ferrous sulfate 325 (65 FE) MG tablet Take 1 tablet (325 mg total) by mouth daily with breakfast. Please take for 1 month, may stop sooner if develop constipation 09/20/21 09/20/22 Yes Stehler, Pricilla Larsson, PA-C  Insulin Glargine (BASAGLAR KWIKPEN) 100 UNIT/ML Inject 62 Units into the skin daily. Patient taking differently: Inject 71 Units into the skin daily. 09/10/21  Yes [name], MD  oxyCODONE (OXY IR/ROXICODONE) 5 MG immediate release tablet Take 1 tablet (5 mg total) by mouth every 6 (six) hours as needed for severe pain. 09/20/21  Yes Stehler, Pricilla Larsson, PA-C  Semaglutide, 1 MG/DOSE, 4 MG/3ML SOPN Inject 1 mg as directed once a week. 07/12/21  Yes Charlott Rakes, MD  aspirin EC 81 MG tablet Take 1 tablet (81 mg total) by mouth daily. Swallow whole. Patient not taking: Reported on 09/28/2021 09/20/21   Magdalene [place], PA-C  Continuous Blood Gluc Sensor (FREESTYLE LIBRE 2 SENSOR) MISC Utilize as directed q 14 days to monitor blood sugar. 05/10/21   Charlott Rakes, MD  glucose blood (TRUE METRIX BLOOD GLUCOSE TEST) test strip Use to check blood sugar three times daily. 06/10/21   Charlott Rakes, MD  Insulin Pen Needle (TRUEPLUS 5-BEVEL PEN NEEDLES) 32G X 4 MM MISC Use to inject Basaglar once daily. 05/10/21   Charlott Rakes, MD  TRUEplus Lancets 28G MISC Use to check blood sugar three times daily. 06/10/21   Charlott Rakes, MD     History reviewed. No pertinent family history.  Social History   Socioeconomic History   Marital status: Legally Separated    Spouse name: Not on file   Number of children: 0   Years of  education: Not on file   Highest education level: Not on file  Occupational History   Not on file  Tobacco Use   Smoking status: Former    Packs/day: 0.50    Types: Cigarettes    Quit date: 2017    Years since quitting: 6.7   Smokeless tobacco: Never  Vaping Use   Vaping Use: Some days   Start date: 10/14/2017   Last attempt to quit: 09/09/2021  Substance and Sexual Activity   Alcohol use: Yes    Comment: very occasional, maybe a beer or mixed drink once every few months   Drug use: Not Currently   Sexual activity: Not on file  Other Topics  Concern   Not on file  Social History Narrative   Not on file   Social Determinants of Health   Financial Resource Strain: Not on file  Food Insecurity: Not on file  Transportation Needs: Not on file  Physical Activity: Not on file  Stress: Not on file  Social Connections: Not on file      Review of Systems pt drowsy but currently denies pain, resp issues or N/V  Vital Signs: BP (!) 101/48   Pulse 82   Temp 98.6 F (37 C) (Oral)   Resp (!) 25   Ht 5\' 7"  (1.702 m)   Wt 187 lb 2.7 oz (84.9 kg)   SpO2 (S) 98%   BMI 29.32 kg/m     Physical Exam pt drowsy/lethargic; chest- CTA bilat ant; heart- RRR; abd- soft,+BS,NT; no LE edema  Imaging: DG CHEST PORT 1 VIEW  Result Date: 11/02/2021 CLINICAL DATA:  Respiratory failure. EXAM: PORTABLE CHEST 1 VIEW COMPARISON:  October 29, 2021 FINDINGS: Stable support apparatus. Enlarged cardiac silhouette. Bibasilar, left greater than right streaky airspace opacities. Osseous structures are without acute abnormality. Soft tissues are grossly normal. IMPRESSION: Bibasilar, left greater than right streaky airspace opacities may represent atelectasis or airspace consolidation. Electronically Signed   By: Fidela Salisbury M.D.   On: 11/02/2021 14:26   DG Abd Portable 1V  Result Date: 10/31/2021 CLINICAL DATA:  Feeding tube placement EXAM: PORTABLE ABDOMEN - 1 VIEW COMPARISON:  10/29/2021  FINDINGS: Feeding tube coiled in the stomach with the tip in the region of the gastric antrum or proximal duodenum. This has been pulled back since the prior study. Normal bowel gas pattern. IMPRESSION: Feeding tube tip in the region of the gastric antrum or proximal duodenum. Electronically Signed   By: Franchot Gallo M.D.   On: 10/31/2021 16:43   DG Abd Portable 1V  Result Date: 10/29/2021 CLINICAL DATA:  Feeding tube placement EXAM: PORTABLE ABDOMEN - 1 VIEW COMPARISON:  Previous studies including the examination done earlier today FINDINGS: There is interval change in location of tip of feeding tube which is noted in the course of proximal jejunum. There is NG tube with its tip in the stomach. Bowel gas pattern is nonspecific. IMPRESSION: Tip of feeding tube is seen in the proximal course of jejunum. Electronically Signed   By: Elmer Picker M.D.   On: 10/29/2021 14:42   DG CHEST PORT 1 VIEW  Result Date: 10/29/2021 CLINICAL DATA:  Respiratory failure. EXAM: PORTABLE CHEST 1 VIEW COMPARISON:  Chest x-ray dated October 23, 2021. FINDINGS: Unchanged tracheostomy tube and right internal jugular central venous catheter. New left upper extremity PICC line with tip at the cavoatrial junction. Unchanged enteric and feeding tubes. Stable cardiomediastinal silhouette status post CABG. Normal pulmonary vascularity. Improved aeration at the left lung base. No pneumothorax. No acute osseous abnormality. IMPRESSION: 1. New left upper extremity PICC line in good position. 2. Improved aeration at the left lung base. Electronically Signed   By: Titus Dubin M.D.   On: 10/29/2021 08:23   DG Abd 1 View  Result Date: 10/29/2021 CLINICAL DATA:  Abdominal distention. EXAM: ABDOMEN - 1 VIEW COMPARISON:  Abdominal x-ray dated October 24, 2021. FINDINGS: Feeding tube tip has retracted in the interval, with new redundancy in the stomach and the tip now likely within the first portion of the duodenum. Unchanged  large bore enteric tube in the stomach. The bowel gas pattern is normal. No radio-opaque calculi or other significant radiographic abnormality are seen. IMPRESSION:  1. Feeding tube tip has retracted in the interval, with new redundancy in the stomach and the tip now likely within the first portion of the duodenum. Electronically Signed   By: Titus Dubin M.D.   On: 10/29/2021 08:13   [place] EKG SITE RITE  Result Date: 10/27/2021 If Site Rite image not attached, placement could not be confirmed due to current cardiac rhythm.  DG Abd Portable 1V  Result Date: 10/24/2021 CLINICAL DATA:  Feeding tube placement EXAM: PORTABLE ABDOMEN - 1 VIEW COMPARISON:  10/21/2021 FINDINGS: Nasogastric tube with the tip projecting over the stomach. Feeding tube with the tip projecting over the duodenal jejunal flexure. No bowel dilatation to suggest obstruction. No evidence of pneumoperitoneum, portal venous gas or pneumatosis. No pathologic calcifications along the expected course of the ureters. No acute osseous abnormality. IMPRESSION: 1. Nasogastric tube with the tip projecting over the stomach. Feeding tube with the tip projecting over the duodenal jejunal flexure. Electronically Signed   By: Kathreen Devoid M.D.   On: 10/24/2021 12:45   ECHOCARDIOGRAM LIMITED  Result Date: 10/23/2021    ECHOCARDIOGRAM LIMITED REPORT   Patient Name:   ADISON JERGER [facility] Date of Exam: 10/23/2021 Medical Rec #:  [id number]    Height:       67.0 in Accession #:    [id number]   Weight:       201.9 lb Date of Birth:  1961-01-19    BSA:          2.031 m Patient Age:    14 years     BP:           108/44 mmHg Patient Gender: M            HR:           56 bpm. Exam Location:  Inpatient Procedure: Limited Echo, Cardiac Doppler, Color Doppler and Intracardiac            Opacification Agent Indications:    CHF  History:        Patient has prior history of Echocardiogram examinations, most                 recent 10/07/2021. Cardiomyopathy, Pericardial Disease,                  Arrythmias:Cardiac Arrest; Risk Factors:Hypertension, Diabetes                 and Current Smoker.  Sonographer:    Harvie Junior Referring Phys: [phone] [facility]  Sonographer Comments: Technically difficult study due to poor echo windows, no parasternal window, no subcostal window and patient is obese. IMPRESSIONS  1. Left ventricular ejection fraction, by estimation, is 65 to 70%. The left ventricle has normal function. The left ventricle has no regional wall motion abnormalities. There is mild concentric left ventricular hypertrophy. Left ventricular diastolic parameters are consistent with Grade I diastolic dysfunction (impaired relaxation).  2. Right ventricular systolic function is mildly reduced. The right ventricular size is normal.  3. Aortic valve regurgitation is trivial. No aortic stenosis is present.  4. The mitral valve was not well visualized. No evidence of mitral valve regurgitation.  5. Technically very difficult echo, limited images available. FINDINGS  Left Ventricle: Left ventricular ejection fraction, by estimation, is 65 to 70%. The left ventricle has normal function. The left ventricle has no regional wall motion abnormalities. Definity contrast agent was given IV to delineate the left ventricular  endocardial borders. The left ventricular internal cavity  size was normal in size. There is mild concentric left ventricular hypertrophy. Left ventricular diastolic parameters are consistent with Grade I diastolic dysfunction (impaired relaxation). Right Ventricle: The right ventricular size is normal. Right ventricular systolic function is mildly reduced. Left Atrium: Left atrial size was normal in size. Right Atrium: Right atrial size was normal in size. Mitral Valve: The mitral valve was not well visualized. Tricuspid Valve: The tricuspid valve is not well visualized. Aortic Valve: Aortic valve regurgitation is trivial. Aortic regurgitation PHT measures 792 msec. No aortic  stenosis is present. Aortic valve mean gradient measures 3.0 mmHg. Aortic valve peak gradient measures 6.0 mmHg. IAS/Shunts: No atrial level shunt detected by color flow Doppler.  LV Volumes (MOD) LV vol d, MOD A2C: 89.3 ml  Diastology LV vol d, MOD A4C: 160.0 ml LV e' medial:    6.96 cm/s LV vol s, MOD A2C: 59.0 ml  LV E/e' medial:  10.5 LV vol s, MOD A4C: 104.0 ml LV e' lateral:   7.83 cm/s LV SV MOD A2C:     30.3 ml  LV E/e' lateral: 9.4 LV SV MOD A4C:     160.0 ml LV SV MOD BP:      42.1 ml                              3D Volume EF:                             3D EF:        23 %                             LV EDV:       159 ml                             LV ESV:       123 ml                             LV SV:        36 ml RIGHT VENTRICLE RV S prime:     10.50 cm/s TAPSE (M-mode): 1.2 cm LEFT ATRIUM           Index LA Vol (A4C): 40.8 ml 20.09 ml/m  AORTIC VALVE AV Vmax:           122.00 cm/s AV Vmean:          83.200 cm/s AV VTI:            0.208 m AV Peak Grad:      6.0 mmHg AV Mean Grad:      3.0 mmHg LVOT Vmax:         101.00 cm/s LVOT Vmean:        61.200 cm/s LVOT VTI:          0.195 m LVOT/AV VTI ratio: 0.94 AI PHT:            792 msec MITRAL VALVE MV Area (PHT): 3.23 cm    SHUNTS MV Decel Time: 235 msec    Systemic VTI: 0.20 m MV E velocity: 73.30 cm/s MV A velocity: 73.70 cm/s MV E/A ratio:  0.99 Dalton McleanMD Electronically signed by Franki Monte Signature Date/Time: 10/23/2021/4:28:53 PM  Final    DG Chest Port 1 View  Result Date: 10/23/2021 CLINICAL DATA:  A 61 year old male presents with chest pain and shortness of breath. History of stroke. EXAM: PORTABLE CHEST 1 VIEW COMPARISON:  October 21, 2021 FINDINGS: RIGHT IJ dialysis catheter tip in the upper superior vena cava similar to prior study. Tracheostomy tube in stable position, tip between clavicular heads. Feeding tube and gastric tube coursing into the abdomen. Feeding tube tip in the descending duodenum. Gastric tube tip in the  stomach. Post median sternotomy as before. Stable cardiomediastinal contours. Signs of percutaneous coronary intervention as well. Decreased aeration at the LEFT lung base with potential reaccumulating pleural fluid in the LEFT lower chest since previous imaging. No pneumothorax. RIGHT chest is clear. On limited assessment no acute skeletal findings. IMPRESSION: 1. Decreased aeration/developing airspace process with potential LEFT pleural effusion with reaccumulation since previous imaging. 2. Stable support devices. Electronically Signed   By: Zetta Bills M.D.   On: 10/23/2021 09:37   ECHOCARDIOGRAM LIMITED  Result Date: 10/22/2021    ECHOCARDIOGRAM LIMITED REPORT   Patient Name:   REAL CONA Date of Exam: 10/07/2021 Medical Rec #:  [id number]    Height: Accession #:    [id number]   Weight: Date of Birth:  09-Mar-1960    BSA: Patient Age:    89 years     BP:           122/52 mmHg Patient Gender: M            HR:           ** bpm. Exam Location:  Inpatient Procedure: Limited Echo Indications:     CHF  History:         Patient has prior history of Echocardiogram examinations.                  Cardiomyopathy, CAD, Prior CABG; Risk Factors:Hypertension,                  Dyslipidemia and Diabetes.  Sonographer:     Roseanna Rainbow RDCS Referring Phys:  Kirk Ruths McleanMD Diagnosing Phys: Franki Monte  Sonographer Comments: Impella check with Dr. Aundra Dubin present during exam IMPRESSIONS  1. Impella inflow at 4.4 cm from the aortic valve. Left ventricular ejection fraction, by estimation, is 65 to 70%. The left ventricle has hyperdynamic function. There is severe left ventricular hypertrophy.  2. Right ventricular systolic function is mildly reduced. The right ventricular size is normal.  3. The inferior vena cava is normal in size with <50% respiratory variability, suggesting right atrial pressure of 8 mmHg.  4. Limited echo for Impella position. FINDINGS  Left Ventricle: Impella inflow at 4.4 cm from the aortic valve. Left  ventricular ejection fraction, by estimation, is 65 to 70%. The left ventricle has hyperdynamic function. The left ventricular internal cavity size was normal in size. There is severe left ventricular hypertrophy. Right Ventricle: The right ventricular size is normal. Right ventricular systolic function is mildly reduced. Venous: The inferior vena cava is normal in size with less than 50% respiratory variability, suggesting right atrial pressure of 8 mmHg. Dalton [employer] Electronically signed by Franki Monte Signature Date/Time: 10/22/2021/10:07:40 AM    Final (Updated)    DG Abd 1 View  Result Date: 10/21/2021 CLINICAL DATA:  Feeding tube placement. EXAM: ABDOMEN - 1 VIEW COMPARISON:  None Available. FINDINGS: The bowel gas pattern is normal. Distal tip of nasogastric tube is seen in expected position of proximal stomach. Distal  tip of feeding tube is seen in second portion of duodenum. No radio-opaque calculi or other significant radiographic abnormality are seen. IMPRESSION: Distal tip of nasogastric tube seen in expected position of proximal stomach. Distal tip of feeding tube seen in expected position of second portion of duodenum. Electronically Signed   By: Marijo Conception M.D.   On: 10/21/2021 08:37   DG Chest Port 1 View  Result Date: 10/21/2021 CLINICAL DATA:  Status post CABG EXAM: PORTABLE CHEST 1 VIEW COMPARISON:  10/19/2021 FINDINGS: There is a right IJ catheter with tip in the projection of the SVC. Stable tracheostomy tube. Feeding tube and NG tube are stable with tips below the level of the hemidiaphragms. Interval decrease in left pleural effusion with improved aeration to the left lung base. IMPRESSION: Decrease in left pleural effusion with improved aeration to the left lung base. Electronically Signed   By: Kerby Moors M.D.   On: 10/21/2021 06:50   DG CHEST PORT 1 VIEW  Result Date: 10/19/2021 CLINICAL DATA:  Central venous catheter in place EXAM: PORTABLE CHEST 1 VIEW  COMPARISON:  Chest x-rays dated 10/17/2021. FINDINGS: Tracheostomy tube is stable in position with tip in the midline. LEFT IJ central line is stable in position with tip at the level of the upper SVC. RIGHT IJ Cordis in place with tip above the level of the SVC. Enteric tube passes below the diaphragm. Heart size and mediastinal contours are grossly stable. Persistent opacity at the LEFT lung base. RIGHT lung is clear. No pneumothorax is seen. IMPRESSION: 1. Support apparatus appears appropriately positioned, as detailed above. 2. Persistent opacity at the LEFT lung base, likely atelectasis and/or small pleural effusion. No new lung findings. No pneumothorax. Electronically Signed   By: Franki Cabot M.D.   On: 10/19/2021 12:24   DG CHEST PORT 1 VIEW  Result Date: 10/17/2021 CLINICAL DATA:  Impella device removed.  New trach placed. EXAM: PORTABLE CHEST 1 VIEW COMPARISON:  10/17/2021 and earlier FINDINGS: Tracheostomy tube is in place, tip overlying the level of the proximal trachea. Feeding tube and nasogastric tube are in place, tip beyond the level of the image. LEFT IJ central line tip overlies the superior vena cava. RIGHT subclavian line has been removed. Impella device has been removed. Heart size is normal. There is patchy opacity in the LEFT lung base, similar to prior study. Cardiomegaly, stable. IMPRESSION: 1. Stable cardiomegaly. 2. Stable LEFT LOWER lobe atelectasis or infiltrate. 3. Tracheostomy tube in good position. Electronically Signed   By: Nolon Nations M.D.   On: 10/17/2021 10:54   DG Abd 1 View  Result Date: 10/17/2021 CLINICAL DATA:  Postop. Impella device removed. New tracheostomy placed. New nasogastric tube. EXAM: ABDOMEN - 1 VIEW COMPARISON:  10/15/2021 FINDINGS: Interval removal of Impella device. Nasogastric tube is in place, tip overlying the level of the stomach. Side port is below the expected location of the gastroesophageal junction. Feeding tube tip overlies the region of  the descending duodenum. There is minimal LEFT LOWER lobe atelectasis. IMPRESSION: Feeding tube and nasogastric tube as described. Electronically Signed   By: Nolon Nations M.D.   On: 10/17/2021 10:50   DG CHEST PORT 1 VIEW  Result Date: 10/17/2021 CLINICAL DATA:  Heart surgery EXAM: PORTABLE CHEST 1 VIEW COMPARISON:  10/15/2021 FINDINGS: ETT terminates within the midthoracic trachea. Enteric tube courses below the diaphragm with distal tip beyond the inferior margin of the film. Left IJ central venous catheter terminates in the proximal SVC. Impella left  ventricular assist device remains in place. Stable cardiomegaly. Similar degree of left basilar opacity, likely atelectasis. No pneumothorax. IMPRESSION: 1. Stable support apparatus as above. 2. Similar degree of left basilar opacity, likely atelectasis. Electronically Signed   By: Davina Poke D.O.   On: 10/17/2021 08:26   DG CHEST PORT 1 VIEW  Result Date: 10/15/2021 CLINICAL DATA:  Vomiting EXAM: PORTABLE CHEST 1 VIEW COMPARISON:  10/15/2021, 10/14/2021, 10/13/2021, 10/12/2021 FINDINGS: Endotracheal tube tip about 4.9 cm superior to the carina. Right IJ central venous catheter tip over the SVC. Left ventricular assist device similar in position. Left IJ central venous catheter tip over the SVC origin. Esophageal tube tip below the diaphragm. Post sternotomy changes. Cardiomegaly with mild airspace disease at the left lung base, slowly improving. Aortic atherosclerosis. No pneumothorax. IMPRESSION: 1. Similar positioning of support lines and tubes as above 2. Cardiomegaly 3. Mild airspace disease at the left lung base, not significantly changed as compared with radiograph earlier today slightly improved as compared with radiographs several days ago. Electronically Signed   By: Donavan Foil M.D.   On: 10/15/2021 16:09   DG Abd Portable 1V  Result Date: 10/15/2021 CLINICAL DATA:  Vomiting EXAM: PORTABLE ABDOMEN - 1 VIEW COMPARISON:  10/04/2021  FINDINGS: Streaky opacities at the left lung base. Esophageal tube tip overlies the stomach. Feeding tube tip incompletely visualized but tubing visualized at the duodenum. Upper gas pattern is unremarkable IMPRESSION: 1. Esophageal tube tip overlies the stomach. Feeding tube tip incompletely included on the image but tubing is visualized at least to the second portion of duodenum Electronically Signed   By: Donavan Foil M.D.   On: 10/15/2021 16:05   DG CHEST PORT 1 VIEW  Result Date: 10/15/2021 CLINICAL DATA:  Endotracheal tube present. Open heart surgery. Impella heart pump. EXAM: PORTABLE CHEST 1 VIEW COMPARISON:  AP chest 10/14/2021 FINDINGS: Status post median sternotomy. Left ventricular assist device is not significantly changed. Right internal jugular central venous catheter sheath tip again overlies the central superior vena cava. Left internal jugular central venous catheter tip again overlies the junction of the left brachiocephalic vein and the superior vena cava. Cardiac silhouette is again moderately enlarged. Mediastinal contours are within normal limits. Mild-to-moderate calcification within the aortic arch. The lungs are clear. Unchanged left retrocardiac heterogeneous opacification. No definite pleural effusion. No pneumothorax. No acute skeletal abnormality. IMPRESSION: 1. Support apparatus not significantly changed. 2. Stable moderately enlarged cardiac silhouette. 3. Left retrocardiac opacity, likely atelectasis, unchanged. Electronically Signed   By: Yvonne Kendall M.D.   On: 10/15/2021 08:20   DG CHEST PORT 1 VIEW  Result Date: 10/14/2021 CLINICAL DATA:  Follow-up study.  Intubated in patient. EXAM: PORTABLE CHEST 1 VIEW COMPARISON:  10/13/2021 and older studies. FINDINGS: Right lung base opacity has mostly resolved, right hemidiaphragm now well-defined. Persistent left lung base opacity consistent with atelectasis. Remainder of the lungs is clear. No pneumothorax. Endotracheal tube,  enteric tube, right internal jugular central venous line, left internal jugular central venous line and left ventricular assist device are stable. IMPRESSION: 1. Interval improvement in lung aeration. Previously noted opacity at the right lung base has mostly resolved. 2. No acute findings in the lungs. No pneumothorax. Stable left lung base atelectasis. 3. Stable support apparatus. Electronically Signed   By: Lajean Manes M.D.   On: 10/14/2021 09:20   DG CHEST PORT 1 VIEW  Result Date: 10/13/2021 CLINICAL DATA:  Surgery follow-up. EXAM: PORTABLE CHEST 1 VIEW COMPARISON:  10/12/2021 and older exams. FINDINGS:  Increased opacity noted at the right lung base consistent a pleural effusion and associated atelectasis. Persistent opacity at the left lung base also consistent with pleural fluid and atelectasis. Mid and upper lungs are clear.  No pneumothorax. Endotracheal tube, nasal/orogastric tube and right internal jugular central venous line are stable. Left ventricular assist device is also stable. IMPRESSION: 1. New/increased opacity at the right lung base consistent with a combination of pleural effusion and atelectasis. 2. No other change from the previous day's study. No mediastinal widening, pulmonary edema or pneumothorax. 3. Stable left lung base opacity consistent with atelectasis and pleural fluid. 4. Stable well-positioned support apparatus. Electronically Signed   By: Lajean Manes M.D.   On: 10/13/2021 08:20   DG CHEST PORT 1 VIEW  Result Date: 10/12/2021 CLINICAL DATA:  Status post cardiac surgery EXAM: PORTABLE CHEST 1 VIEW COMPARISON:  Previous studies including the examination of 10/11/2021 FINDINGS: Transverse diameter of heart is increased. There are no signs of alveolar pulmonary edema. There is increased density in left lower lung field. Left lateral CP angle is indistinct. There is interval median sternotomy. There is Impella device introduced through the right subclavian with no significant  change. There is interval removal of left chest tube. There is mediastinal drain. Tip of right IJ central venous catheter is seen in superior vena cava. Tip of left IJ central venous catheter is noted at the junction of the innominate and superior vena cava. Tip of endotracheal tube is approximately 4.4 cm above the carina. Enteric tube is noted traversing the esophagus. IMPRESSION: Cardiomegaly. Increased density in left lower lung field may be due to pleural effusion and atelectasis/pneumonia. Electronically Signed   By: Elmer Picker M.D.   On: 10/12/2021 15:40   ECHO INTRAOPERATIVE TEE  Result Date: 10/12/2021  *INTRAOPERATIVE TRANSESOPHAGEAL REPORT *  Patient Name:   JAWANZA ZAMBITO [facility] [place]   Date of Exam: 10/12/2021 Medical Rec #:  [id number]      Height:       67.0 in Accession #:    [id number]     Weight:       235.0 lb Date of Birth:  11-30-1960      BSA:          2.17 m Patient Age:    2 years       BP:           122/56 mmHg Patient Gender: M              HR:           63 bpm. Exam Location:  Anesthesiology Transesophogeal exam was perform intraoperatively during surgical procedure. Patient was closely monitored under general anesthesia during the entirety of examination. Indications:     Open Chest Performing Phys: [id number] BRYAN K BARTLE Complications: No known complications during this procedure. PRE-OP FINDINGS  Left Ventricle: The left ventricle has mildly reduced systolic function, with an ejection fraction of 45-50%. The cavity size was normal. There is mildly increased left ventricular wall thickness. Pt on epinephrine drip. Right Ventricle: The right ventricle has moderately reduced systolic function. The cavity was normal. There is no increase in right ventricular wall thickness. Left Atrium: Left atrial size was not assessed. No left atrial/left atrial appendage thrombus was detected. Right Atrium: Right atrial size was not assessed. Interatrial Septum: No atrial level shunt detected by color flow  Doppler. Pericardium: There is no evidence of pericardial effusion. Mitral Valve: The mitral valve is normal in structure. Mitral valve regurgitation is  trivial by color flow Doppler. Tricuspid Valve: The tricuspid valve was normal in structure. Tricuspid valve regurgitation was not visualized by color flow Doppler. Aortic Valve: The aortic valve is normal in structure. Aortic valve regurgitation is mild by color flow Doppler. Impella in good position across the AV. Pulmonic Valve: The pulmonic valve was normal in structure. Pulmonic valve regurgitation is not visualized by color flow Doppler.  Roderic Palau MD Electronically signed by Roderic Palau MD Signature Date/Time: 10/12/2021/11:10:32 AM   Final    DG CHEST PORT 1 VIEW  Result Date: 10/11/2021 CLINICAL DATA:  61 year old male status post insertion of Impella device on 10/07/2021 for cardiogenic shock, status post ECMO and pericardial window last month. EXAM: PORTABLE CHEST 1 VIEW COMPARISON:  Portable chest 10/10/2021 and earlier. FINDINGS: Portable AP semi upright view at 0534 hours. Patient remains rotated to the left. Stable lines and tubes. Three left chest and/or mediastinal tubes remain in place. Impella device from right upper extremity approach appears stable. Increasing veiling left lower lung opacity and obscuration of the left mediastinal contours. No definite pneumothorax. Right lung appears negative. Paucity of bowel gas.  Stable visualized osseous structures. IMPRESSION: 1. More rotated to the left with increased veiling left lung base opacity, obscuration of the left mediastinal contours. No definite left pneumothorax. Right lung appears negative. 2. Extensive lines and tubes appear stable. Electronically Signed   By: Genevie Ann M.D.   On: 10/11/2021 08:19   DG CHEST PORT 1 VIEW  Result Date: 10/10/2021 CLINICAL DATA:  [id number]; ETT, chest tube present, status post Impella follow-up study EXAM: PORTABLE CHEST 1 VIEW COMPARISON:   Chest x-ray from yesterday FINDINGS: Again seen are the endotracheal tube, feeding tube, bilateral transjugular catheters, Impella catheter, left-sided chest tubes and presumed mediastinal drainage tube are stable. The tip of the enteric tube is off the field of view. Cardiopericardial silhouette is mildly enlarged, stable. There is some opacity seen at the left lung base, likely atelectasis, stable. Minor atelectasis at the right lung base, stable as well. The visualized skeletal structures are unremarkable. IMPRESSION: 1.  Life supporting apparatus are stable. 2. Atelectasis at the left lung base without significant interval change. Electronically Signed   By: Frazier Richards M.D.   On: 10/10/2021 08:12   DG CHEST PORT 1 VIEW  Result Date: 10/09/2021 CLINICAL DATA:  Status post cardiac surgery EXAM: PORTABLE CHEST 1 VIEW COMPARISON:  Chest radiograph 10/08/2021 FINDINGS: The endotracheal tube tip is approximately 3.0 cm from the carina. An Impella device projects over the left ventricle. A right IJ vascular catheter terminates in the mid to lower SVC. A left IJ vascular catheter terminates in the region of the confluence of left brachiocephalic vein/upper SVC, stable. Left-sided chest tubes and a presumed mediastinal drain are stable. The enteric catheter tip is off the field of view. The cardiomediastinal silhouette is stable. Hazy opacities projecting over the lower lobes likely reflect layering pleural effusions, similar to the prior study. There is no new or worsening focal airspace disease. There is no pneumothorax. The bones are stable. IMPRESSION: 1. Support devices as above. 2. Layering bilateral pleural effusions, unchanged. No new or worsening focal airspace disease. No pneumothorax. Electronically Signed   By: Valetta Mole M.D.   On: 10/09/2021 08:10   DG CHEST PORT 1 VIEW  Result Date: 10/08/2021 CLINICAL DATA:  Check endotracheal tube placement EXAM: PORTABLE CHEST 1 VIEW COMPARISON:  09/17/2021  FINDINGS: Endotracheal tube is noted in satisfactory position. Feeding catheter extends into the  stomach. Left thoracostomy tubes and pericardial drain are seen and stable. Impella catheter as well as bilateral jugular central lines are noted and stable. Postsurgical changes are seen consistent with bypass grafting. No focal infiltrate or effusion is noted. IMPRESSION: Tubes and lines in satisfactory position. No acute abnormality noted. Electronically Signed   By: Inez Catalina M.D.   On: 10/08/2021 04:01   DG Chest Port 1 View  Result Date: 10/07/2021 CLINICAL DATA:  Heart failure. EXAM: PORTABLE CHEST 1 VIEW COMPARISON:  Same day. FINDINGS: Stable cardiomegaly. Status post coronary bypass graft. Right internal jugular catheter is noted with tip in expected position of the SVC. Feeding tube is seen entering stomach. Tracheostomy tube is in good position. Left-sided chest tube is noted without pneumothorax. Bibasilar atelectasis and effusions are noted. Bony thorax is unremarkable. There is been interval placement of Impella device from right subclavian approach. IMPRESSION: Interval placement of Impella device from right subclavian approach. Left-sided chest tube is noted without pneumothorax. Stable bibasilar atelectasis and effusions are noted. Electronically Signed   By: Marijo Conception M.D.   On: 10/07/2021 15:57   DG C-Arm 1-60 Min-No Report  Result Date: 10/07/2021 Fluoroscopy was utilized by the requesting physician.  No radiographic interpretation.   DG C-Arm 1-60 Min-No Report  Result Date: 10/07/2021 Fluoroscopy was utilized by the requesting physician.  No radiographic interpretation.   DG C-Arm 1-60 Min-No Report  Result Date: 10/07/2021 Fluoroscopy was utilized by the requesting physician.  No radiographic interpretation.   DG Chest Port 1 View  Result Date: 10/07/2021 CLINICAL DATA:  ECMO, ETT present EXAM: PORTABLE CHEST 1 VIEW COMPARISON:  10/06/2021 FINDINGS: No significant  change in AP portable chest radiograph. Cardiomegaly. Support apparatus including endotracheal tube, left and right neck vascular catheters, left-sided chest and mediastinal drainage tubes, and arteriovenous ECMO cannula are unchanged. Probable small layering pleural effusions. IMPRESSION: 1. No significant change in AP portable chest radiograph. 2. Unchanged support apparatus as above. 3. Probable small layering pleural effusions. No new airspace opacity. Electronically Signed   By: Delanna Ahmadi M.D.   On: 10/07/2021 08:18   DG Chest Port 1 View  Result Date: 10/06/2021 CLINICAL DATA:  Personal history of ECMO EXAM: PORTABLE CHEST 1 VIEW COMPARISON:  October 05, 2021 FINDINGS: The ET tube, left central line, right central line, ECMO catheter, mediastinal drain, and left chest tubes are all stable. No pneumothorax. Atelectasis in the left base. The left lung is otherwise clear. Haziness over the right chest is at least partially explained by layering effusion. No change in the cardiomediastinal silhouette. No other interval changes. IMPRESSION: 1. Support apparatus as above. 2. Probable small layering effusion on the right. 3. Atelectasis in the left base.  Probable tiny left effusion. 4. No pneumothorax. Electronically Signed   By: Dorise Bullion III M.D.   On: 10/06/2021 08:04   DG CHEST PORT 1 VIEW  Result Date: 10/05/2021 CLINICAL DATA:  Chemotherapy EXAM: PORTABLE CHEST 1 VIEW COMPARISON:  October 04, 2021 FINDINGS: The cardiomediastinal silhouette is stable. No pneumothorax. The ETT, left central line, left chest tubes, right central line, mediastinal tube, and ECMO catheter are in stable position. The feeding tube terminates below today's film. Haziness over the right chest is likely a small layering effusion. Persistent opacity in the left base, likely a small effusion with underlying atelectasis. No other interval changes. IMPRESSION: 1. Stable support devices as above. 2. Stable left basilar  opacity. 3. Mild haziness over the right chest is likely a  small layering effusion. Electronically Signed   By: Dorise Bullion III M.D.   On: 10/05/2021 08:33    Labs:  CBC: Recent Labs    10/31/21 0506 11/01/21 0344 11/02/21 0439 11/03/21 0641  WBC 25.5* 24.1* 24.7* 24.2*  HGB 8.0* 7.5* 7.3* 7.0*  HCT 27.4* 24.9* 24.0* 23.7*  PLT 390 426* 445* 437*    COAGS: Recent Labs    10/06/21 1600 10/07/21 0408 10/07/21 1530 10/08/21 0350 10/15/21 1519 10/16/21 0407 10/18/21 0331 10/19/21 0441 10/20/21 0454 10/21/21 0403  INR  --  1.4* 1.6*   < > 1.3*   < > 1.3* 1.3* 1.3* 1.3*  APTT 44* 46* 124*  --  35  --   --   --   --   --    < > = values in this interval not displayed.    BMP: Recent Labs    10/31/21 1624 11/01/21 0344 11/02/21 0439 11/03/21 0641  NA 141 136 131* 134*  K 5.6* 3.8 3.5 4.1  CL 109 94* 92* 93*  CO2 19* 25 23 25   GLUCOSE 121* 107* 219* 175*  BUN 115* 65* 114* 77*  CALCIUM 9.0 9.2 9.1 9.1  CREATININE 6.21* 3.44* 5.53* 3.79*  GFRNONAA 10* 19* 11* 17*    LIVER FUNCTION TESTS: Recent Labs    10/05/21 0441 10/06/21 1600 10/07/21 0408 10/08/21 0350 10/22/21 0406 10/22/21 1529 10/31/21 1624 11/01/21 0344 11/02/21 0439 11/03/21 0641  BILITOT 1.0 2.2* 1.1  --  1.0  --   --   --   --   --   AST 28 99* 97*  --  38  --   --   --   --   --   ALT 11 10 15   --  22  --   --   --   --   --   ALKPHOS 37* 73 89  --  137*  --   --   --   --   --   PROT 3.7* 3.9* 4.1*  --  6.2*  --   --   --   --   --   ALBUMIN 2.0* 1.9* 1.9*   < > 2.4*  2.4*   < > 2.2* 2.6* 2.6* 2.7*   < > = values in this interval not displayed.    TUMOR MARKERS: No results for input(s): "AFPTM", "CEA", "CA199", "CHROMGRNA" in the last 8760 hours.  Assessment and Plan: 61 y.o. male with past medical history of hypertension, hyperlipidemia, diabetes, A-fib, stroke, chronic kidney disease, coronary artery disease with prior CABG on 8/3 who presented to [facility] with dyspnea,  cardiac tamponade and course complicated by cardiac arrest , use of ECMO, status post pericardial drain placement, closure of the sternum and mediastinal washout on 9/2.  Now 17 days out from Impella left ventricular assist device removal and percutaneous tracheostomy.  Also with acute on chronic kidney injury, ischemic ATN due to cardiogenic shock/cardiac arrest.  Started on CRRT on 8/28, stopped 9/19.  Now on IHD.  Existing temp cath not functioning well and request now received for tunneled dialysis catheter placement.  He is currently afebrile, WBC 24.2 (24.7), hemoglobin 7, platelets 437k, creat 3.79.  Recent blood culture negative.  On low-dose norepinephrine and midodrine for blood pressure support.  Per TCTS, WBC count not likely to return to normal very quickly. Right IJ temp cath removed yesterday.  Details/risk of tunneled hemodialysis catheter placement, including but not limited to, internal bleeding, infection, injury  to adjacent structures discussed with patient's spouse Dervin Vore understanding and consent. Procedure tent scheduled for early next week.   Thank you for this interesting consult.  I greatly enjoyed meeting DELDRICK LINCH and look forward to participating in their care.  A copy of this report was sent to the requesting provider on this date.  Electronically Signed: D. Rowe Robert, PA-C 11/03/2021, 1:15 PM   I spent a total of  20 minutes   in face to face in clinical consultation, greater than 50% of which was counseling/coordinating care for tunneled hemodialysis catheter placement

## 2021-11-03 NOTE — Plan of Care (Signed)
Problem: Education: Goal: Will demonstrate proper wound care and an understanding of methods to prevent future damage Outcome: Progressing Goal: Knowledge of disease or condition will improve Outcome: Progressing Goal: Knowledge of the prescribed therapeutic regimen will improve Outcome: Progressing Goal: Individualized Educational Video(s) Outcome: Progressing   Problem: Activity: Goal: Risk for activity intolerance will decrease Outcome: Progressing   Problem: Cardiac: Goal: Will achieve and/or maintain hemodynamic stability Outcome: Progressing   Problem: Clinical Measurements: Goal: Postoperative complications will be avoided or minimized Outcome: Progressing   Problem: Respiratory: Goal: Respiratory status will improve Outcome: Progressing   Problem: Skin Integrity: Goal: Wound healing without signs and symptoms of infection Outcome: Progressing Goal: Risk for impaired skin integrity will decrease Outcome: Progressing   Problem: Urinary Elimination: Goal: Ability to achieve and maintain adequate renal perfusion and functioning will improve Outcome: Progressing   Problem: Education: Goal: Understanding of cardiac disease, CV risk reduction, and recovery process will improve Outcome: Progressing Goal: Individualized Educational Video(s) Outcome: Progressing   Problem: Activity: Goal: Ability to tolerate increased activity will improve Outcome: Progressing   Problem: Cardiac: Goal: Ability to achieve and maintain adequate cardiovascular perfusion will improve Outcome: Progressing   Problem: Health Behavior/Discharge Planning: Goal: Ability to safely manage health-related needs after discharge will improve Outcome: Progressing   Problem: Education: Goal: Knowledge of General Education information will improve Description: Including pain rating scale, medication(s)/side effects and non-pharmacologic comfort measures Outcome: Progressing   Problem: Health  Behavior/Discharge Planning: Goal: Ability to manage health-related needs will improve Outcome: Progressing   Problem: Clinical Measurements: Goal: Ability to maintain clinical measurements within normal limits will improve Outcome: Progressing Goal: Will remain free from infection Outcome: Progressing Goal: Diagnostic test results will improve Outcome: Progressing Goal: Respiratory complications will improve Outcome: Progressing Goal: Cardiovascular complication will be avoided Outcome: Progressing   Problem: Activity: Goal: Risk for activity intolerance will decrease Outcome: Progressing   Problem: Nutrition: Goal: Adequate nutrition will be maintained Outcome: Progressing   Problem: Coping: Goal: Level of anxiety will decrease Outcome: Progressing   Problem: Elimination: Goal: Will not experience complications related to bowel motility Outcome: Progressing Goal: Will not experience complications related to urinary retention Outcome: Progressing   Problem: Pain Managment: Goal: General experience of comfort will improve Outcome: Progressing   Problem: Safety: Goal: Ability to remain free from injury will improve Outcome: Progressing   Problem: Skin Integrity: Goal: Risk for impaired skin integrity will decrease Outcome: Progressing   Problem: Education: Goal: Ability to describe self-care measures that may prevent or decrease complications (Diabetes Survival Skills Education) will improve Outcome: Progressing Goal: Individualized Educational Video(s) Outcome: Progressing   Problem: Coping: Goal: Ability to adjust to condition or change in health will improve Outcome: Progressing   Problem: Fluid Volume: Goal: Ability to maintain a balanced intake and output will improve Outcome: Progressing   Problem: Health Behavior/Discharge Planning: Goal: Ability to identify and utilize available resources and services will improve Outcome: Progressing Goal:  Ability to manage health-related needs will improve Outcome: Progressing   Problem: Metabolic: Goal: Ability to maintain appropriate glucose levels will improve Outcome: Progressing   Problem: Nutritional: Goal: Maintenance of adequate nutrition will improve Outcome: Progressing Goal: Progress toward achieving an optimal weight will improve Outcome: Progressing   Problem: Skin Integrity: Goal: Risk for impaired skin integrity will decrease Outcome: Progressing   Problem: Tissue Perfusion: Goal: Adequacy of tissue perfusion will improve Outcome: Progressing   Problem: Education: Goal: Knowledge about tracheostomy care/management will improve Outcome: Progressing   Problem: Activity: Goal:  Ability to tolerate increased activity will improve Outcome: Progressing

## 2021-11-03 NOTE — Progress Notes (Signed)
NAME:  [name], MRN:  [id number], DOB:  [birth year], LOS: 73 ADMISSION DATE:  09/28/2021, CONSULTATION DATE:  09/29/2021 REFERRING MD:  Kipp Brood - TCTS CHIEF COMPLAINT: Dyspnea   History of Present Illness:  61 year old man who underwent CABG on 8/3, readmitted on 8/19 in setting of dyspnea with a pericardial effusion requiring pericardial window via bedside sternotomy. Also with bilateral pleural effusions. Sustained PEA arrest on 8/20, briefly required [facility] ECMO which was discontinued on 8/28, Impella placed 8/28, started on CRRT.  Hypoxemia, repeat cardiac arrest on 8/30. Underwent mediastinal washout/sternotomy closure 9/2. Impella removed and tracheostomy completed 9/7.  Pertinent Medical History:  CAD s/p CABG 8/3 DM2 Diverticulosis GERD Hyperlipidemia Hypertension History of stroke Tobacco Abuse  Significant Hospital Events: Including procedures, antibiotic start and stop dates in addition to other pertinent events   8/19 Admitted, s/p pericardial window 8/20 PEA cardiac arrest, left pigtail chest tube placement, PEA cardiac arrest due to tamponade, bedside sternotomy with pericardial drains placed. 8/21 Return to OR for overnight bleeding.  8/22 Minimal chest tube output.  Atrial fibrillation, controled with amiodarone.  8/22 TEE showed EF 35% at baseline, which improved significantly with decreasing ECMO flow.  8/23 Tolerated diuresis  8/24 Mediastinal washout, removal of hematoma 8/25 Ongoing bleeding issues from posterior mediastinum 8/28 Decannulated and off ECMO, 5.5 Impella placed, L femoral HD cath placed and started on CRRT 8/30 Hypoxia and hypotension followed by brief arrest, gentle L lateral chest compressions performed and ROSC in roughly 1 minute. 8/31 Remains deeply sedated on ventilator with open chest.  No acute events overnight. 9/1 No acute events overnight, remains on ventilator deep sedation.  Tolerating aggressive volume removal per CRRT 9/2 Sternotomy  closure 9/3 Wean versed 9/4 FOB, low grade temps, notable thick mucus/frequent suctioning 9/5 Vomiting, ?mucus plugging. PSV 5-6 hours on 10/5 9/6 Impella down to p3 9/7 Impella removal and trach. PRBC and DDAVP in OR  9/8 Low dose epi, weaned on 8/8 9/9 RIJ HD catheter placed 9/11 Started on ATC at 28% and tolerating well 9/12 Remains on 28% ATC 9/13 SB/Junction rhythm with associated hypotension overnight requiring transient pressor escalation. Pressor requirement down in AM. WBC count 23 (18), Co-ox 36%. Resp/Blood Cx. 9/14 Mild bradycardia/relative hypotension with Precedex. Remains on Epi 78mcg, Vaso 0.03. Resp Cx with few gram+ cocci (clusters), yeast. BCx NGTD. 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 Co-ox 48%. CRRT stopped. 9/15 WBC continues to downtrend. Hgb 7.6. Net +1.49L/24H. CRRT resumed. Co-ox 51%. NE started to help with Epi wean. 9/16, Co. oximetry improved, WBC trending down, weaning pressors 9/19 on trach collar for 96 hours.  Weaning off vasopressors.  Trial off CRRT today.  Episode of overnight emesis. 9/20 more awake. CRRT off 9/21 cuffless tracheostomy. Do well with PMV 9/22 failed swallow evaluation  Interim History / Subjective:  On 4 mcg/min levophed. No other events  Objective:  Blood pressure (!) 99/55, pulse 85, temperature 98.4 F (36.9 C), temperature source Oral, resp. rate (!) 27, height 5\' 7"  (1.702 m), weight 84.9 kg, SpO2 100 %.    FiO2 (%):  [28 %] 28 %   Intake/Output Summary (Last 24 hours) at 11/03/2021 0918 Last data filed at 11/03/2021 0700 Gross per 24 hour  Intake 2230.85 ml  Output 900 ml  Net 1330.85 ml   Filed Weights   11/02/21 0715 11/02/21 1208 11/03/21 0500  Weight: 84.6 kg 84.6 kg 84.9 kg   Physical Examination: General: Adult male, lying in bed, no  distress  HEENT: Trach in place > clean/dry   Neuro: alert, follows commands, oriented  CV: RRR, HR 84, no mRG PULM: Coarse breath sounds,  no use of accessory muscles  GI: Soft, non tender, active bowel sounds, sligtly distended  Extremities: -edema   Ancillary tests personally reviewed:     Assessment & Plan:   Acute metabolic encephalopathy.   - Agitated at times with poor impulse control. Plan -Continue to wean all sedatives off: Seroquel nightly , Continue low-dose clonazepam, oxycodone changed to PRN  Hemorrhagic pericarditis with tamponade PEA arrest due to tamponade, s/p emergent sternotomy Cardiogenic & hemorrhagic shock, s/p VA ECMO (decannulated 8/29), s/p Impella (removed 9/7) -Off all vasopressors. EF now normal. CAD s/p CABG x3 Afib  Plan -Per CTS -Cardiac Monitoring  -Maintain MAP >60 (of 4 mcg/min levophed) -Continue scheduled midodrine 15 mg TID  Leukocytosis, afebrile  Completed course of daptomycin, completed and meropenem Plan -Finish off 14-day course of micafungin as patient at high risk for fungemia. -PAN Culture if becomes febrile  -Obtain Upper/Lower U/S to r/o DVT (pending)  -Central access removed 9/23. Plan for permcath 9/25 if WBC continues to improve   Acute respiratory failure with hypoxemia and hypercarbia S/p Tracheostomy  - down-sized to cuffless trach 9/22 Plan -Continue trach collar -Passy-Muir valve. -Minimal secretions, plan capping trial today   AKI with anuria  Hypophosphatemia Plan -Transitioned to IHD. (Last session 9/23)  -Permcath placement once leukocytosis improving.    DM II with hyperglycemia Plan -Trend Glucose -SSI   At Risk Malnutrition  -TF via Cortrak -ST following   Sacral pressure wound - WOC following  Severe physical deconditioning -Continue physical therapy.  Best Practice (right click and "Reselect all SmartList Selections" daily)  Diet/type: tubefeeds  DVT prophylaxis: prophylactic heparin  GI prophylaxis: PPI Lines: N/A Foley:  N/A Code Status:  full code Last date of multidisciplinary goals of care discussion patient  updated daily.  CRITICAL CARE Performed by: Omar Person   Total critical care time: 32 minutes  Critical care time was exclusive of separately billable procedures and treating other patients.  Critical care was necessary to treat or prevent imminent or life-threatening deterioration.  Critical care was time spent personally by me on the following activities: development of treatment plan with patient and/or surrogate as well as nursing, discussions with consultants, evaluation of patient's response to treatment, examination of patient, obtaining history from patient or surrogate, ordering and performing treatments and interventions, ordering and review of laboratory studies, ordering and review of radiographic studies, pulse oximetry and re-evaluation of patient's condition.

## 2021-11-03 NOTE — Progress Notes (Signed)
Patient ID: [name], male   DOB: 11/30/1960, 61 y.o.   MRN: [id number]  TCTS Evening Rounds:  Hemodynamically stable in sinus rhythm. NE 5.  Sats 100% on RA  Bladder scanned today and only 15 cc present.

## 2021-11-03 NOTE — Progress Notes (Signed)
17 Days Post-Op Procedure(s) (LRB): REMOVAL OF IMPELLA LEFT VENTRICULAR ASSIST DEVICE (Right) TRANSESOPHAGEAL ECHOCARDIOGRAM (TEE) (N/A) PERCUTANEOUS TRACHEOSTOMY USING SHILEY FLEXIBLE 8 mm CUFFED TRACH. (N/A) Subjective:  No complaints this am. Feels better.    Objective: Vital signs in last 24 hours: Temp:  [97.9 F (36.6 C)-98.4 F (36.9 C)] 98.4 F (36.9 C) (09/24 0800) Pulse Rate:  [78-92] 83 (09/24 0930) Cardiac Rhythm: Normal sinus rhythm (09/23 2000) Resp:  [12-38] 23 (09/24 0930) BP: (81-130)/(37-80) 106/56 (09/24 0930) SpO2:  [96 %-100 %] 100 % (09/24 0930) FiO2 (%):  [28 %] 28 % (09/24 0800) Weight:  [84.6 kg-84.9 kg] 84.9 kg (09/24 0500)  Hemodynamic parameters for last 24 hours:    Intake/Output from previous day: 09/23 0701 - 09/24 0700 In: 2638.7 [I.V.:689.7; HY/IF:0277; IV Piggyback:155] Out: 900 [Stool:900] Intake/Output this shift: No intake/output data recorded.  General appearance: alert and cooperative Neurologic: intact Heart: regular rate and rhythm, S1, S2 normal, no murmur Lungs: clear to auscultation bilaterally Abdomen: soft, non-tender; bowel sounds normal Extremities: warm, no edema Wound: dressing dry. Nurse will change today.  Lab Results: Recent Labs    11/02/21 0439 11/03/21 0641  WBC 24.7* 24.2*  HGB 7.3* 7.0*  HCT 24.0* 23.7*  PLT 445* 437*   BMET:  Recent Labs    11/02/21 0439 11/03/21 0641  NA 131* 134*  K 3.5 4.1  CL 92* 93*  CO2 23 25  GLUCOSE 219* 175*  BUN 114* 77*  CREATININE 5.53* 3.79*  CALCIUM 9.1 9.1    PT/INR: No results for input(s): "LABPROT", "INR" in the last 72 hours. ABG    Component Value Date/Time   PHART 7.301 (L) 10/22/2021 2354   HCO3 19.3 (L) 10/22/2021 2354   TCO2 20 (L) 10/22/2021 2354   ACIDBASEDEF 7.0 (H) 10/22/2021 2354   O2SAT 71.7 11/02/2021 0439   CBG (last 3)  Recent Labs    11/02/21 2311 11/03/21 0316 11/03/21 0739  GLUCAP 237* 164* 164*    Assessment/Plan: S/P  Procedure(s) (LRB): REMOVAL OF IMPELLA LEFT VENTRICULAR ASSIST DEVICE (Right) TRANSESOPHAGEAL ECHOCARDIOGRAM (TEE) (N/A) PERCUTANEOUS TRACHEOSTOMY USING SHILEY FLEXIBLE 8 mm CUFFED TRACH. (N/A)  Hemodynamically stable on low dose NE and midodrine for BP support. Maintaining NSR.  Tolerated HD well yesterday. Planning insertion of perm cath for HD. I doubt WBC ct is going to return to normal very quickly.   Doing well on TC with PMV. Next goal is capping and removal of trach.  Tolerating TF at goal. Hopefully ST can check swallowing again this week. Next goal is oral nutrition but may not happen until trach is out.  Continue PT/Ot.   LOS: 36 days    [name] 11/03/2021

## 2021-11-03 NOTE — Progress Notes (Signed)
VASCULAR LAB    Bilateral upper and lower extremity venous duplex has been performed.  See CV proc for preliminary results.   Mirha Brucato, RVT 11/03/2021, 11:13 AM

## 2021-11-04 ENCOUNTER — Encounter (HOSPITAL_COMMUNITY): Payer: Self-pay | Admitting: Surgery

## 2021-11-04 ENCOUNTER — Inpatient Hospital Stay (HOSPITAL_COMMUNITY): Payer: Self-pay

## 2021-11-04 HISTORY — PX: IR US GUIDE VASC ACCESS LEFT: IMG2389

## 2021-11-04 HISTORY — PX: IR FLUORO GUIDE CV LINE LEFT: IMG2282

## 2021-11-04 LAB — CBC
HCT: 24.9 % — ABNORMAL LOW (ref 39.0–52.0)
Hemoglobin: 7.5 g/dL — ABNORMAL LOW (ref 13.0–17.0)
MCH: 29.5 pg (ref 26.0–34.0)
MCHC: 30.1 g/dL (ref 30.0–36.0)
MCV: 98 fL (ref 80.0–100.0)
Platelets: 432 10*3/uL — ABNORMAL HIGH (ref 150–400)
RBC: 2.54 MIL/uL — ABNORMAL LOW (ref 4.22–5.81)
RDW: 23.5 % — ABNORMAL HIGH (ref 11.5–15.5)
WBC: 25.7 10*3/uL — ABNORMAL HIGH (ref 4.0–10.5)
nRBC: 2.3 % — ABNORMAL HIGH (ref 0.0–0.2)

## 2021-11-04 LAB — RENAL FUNCTION PANEL
Albumin: 2.7 g/dL — ABNORMAL LOW (ref 3.5–5.0)
Anion gap: 19 — ABNORMAL HIGH (ref 5–15)
BUN: 122 mg/dL — ABNORMAL HIGH (ref 8–23)
CO2: 23 mmol/L (ref 22–32)
Calcium: 9.3 mg/dL (ref 8.9–10.3)
Chloride: 90 mmol/L — ABNORMAL LOW (ref 98–111)
Creatinine, Ser: 5.11 mg/dL — ABNORMAL HIGH (ref 0.61–1.24)
GFR, Estimated: 12 mL/min — ABNORMAL LOW (ref >60)
Glucose, Bld: 133 mg/dL — ABNORMAL HIGH (ref 70–99)
Phosphorus: 3 mg/dL (ref 2.5–4.6)
Potassium: 4.9 mmol/L (ref 3.5–5.1)
Sodium: 132 mmol/L — ABNORMAL LOW (ref 135–145)

## 2021-11-04 LAB — IRON AND TIBC
Iron: 39 ug/dL — ABNORMAL LOW (ref 45–182)
Saturation Ratios: 10 % — ABNORMAL LOW (ref 17.9–39.5)
TIBC: 402 ug/dL (ref 250–450)
UIBC: 363 ug/dL

## 2021-11-04 LAB — GLUCOSE, CAPILLARY
Glucose-Capillary: 128 mg/dL — ABNORMAL HIGH (ref 70–99)
Glucose-Capillary: 137 mg/dL — ABNORMAL HIGH (ref 70–99)
Glucose-Capillary: 139 mg/dL — ABNORMAL HIGH (ref 70–99)
Glucose-Capillary: 132 mg/dL — ABNORMAL HIGH (ref 70–99)
Glucose-Capillary: 104 mg/dL — ABNORMAL HIGH (ref 70–99)
Glucose-Capillary: 165 mg/dL — ABNORMAL HIGH (ref 70–99)

## 2021-11-04 LAB — FERRITIN: Ferritin: 215 ng/mL (ref 24–336)

## 2021-11-04 LAB — MAGNESIUM: Magnesium: 2.2 mg/dL (ref 1.7–2.4)

## 2021-11-04 MED ORDER — CEFAZOLIN SODIUM-DEXTROSE 2-4 GM/100ML-% IV SOLN
INTRAVENOUS | Status: AC | PRN
  Administered 2021-11-04: 2 g via INTRAVENOUS

## 2021-11-04 MED ORDER — HYDROMORPHONE HCL 1 MG/ML IJ SOLN
INTRAMUSCULAR | Status: AC | PRN
  Administered 2021-11-04: 1 mg via INTRAVENOUS

## 2021-11-04 MED ORDER — SODIUM CHLORIDE 0.9 % IV SOLN
125.0000 mg | Freq: Every day | INTRAVENOUS | Status: AC
  Administered 2021-11-04 – 2021-11-11 (×8): 125 mg via INTRAVENOUS
  Filled 2021-11-04 (×8): qty 10

## 2021-11-04 MED ORDER — HEPARIN SODIUM (PORCINE) 1000 UNIT/ML IJ SOLN
INTRAMUSCULAR | Status: AC
  Administered 2021-11-05: 1900 [IU] via INTRAVENOUS_CENTRAL
  Filled 2021-11-04: qty 10

## 2021-11-04 MED ORDER — MIDODRINE HCL 5 MG PO TABS
20.0000 mg | ORAL_TABLET | Freq: Three times a day (TID) | ORAL | Status: DC
  Administered 2021-11-04 – 2021-11-19 (×43): 20 mg
  Filled 2021-11-04 (×43): qty 4

## 2021-11-04 MED ORDER — CEFAZOLIN SODIUM-DEXTROSE 2-4 GM/100ML-% IV SOLN
INTRAVENOUS | Status: AC
  Filled 2021-11-04: qty 100

## 2021-11-04 MED ORDER — LIDOCAINE HCL 1 % IJ SOLN
INTRAMUSCULAR | Status: AC
  Administered 2021-11-04: 15 mL
  Filled 2021-11-04: qty 20

## 2021-11-04 MED ORDER — VITAL 1.5 CAL PO LIQD
1000.0000 mL | ORAL | Status: DC
  Administered 2021-11-04 – 2021-11-18 (×17): 1000 mL
  Filled 2021-11-04 (×4): qty 1000

## 2021-11-04 MED ORDER — HYDROMORPHONE HCL 1 MG/ML IJ SOLN
INTRAMUSCULAR | Status: AC
  Filled 2021-11-04: qty 1

## 2021-11-04 NOTE — Progress Notes (Signed)
18 Days Post-Op Procedure(s) (LRB): REMOVAL OF IMPELLA LEFT VENTRICULAR ASSIST DEVICE (Right) TRANSESOPHAGEAL ECHOCARDIOGRAM (TEE) (N/A) PERCUTANEOUS TRACHEOSTOMY USING SHILEY FLEXIBLE 8 mm CUFFED TRACH. (N/A) Subjective: No complaints.  He has been hemodynamically stable on low dose NE and Midodrine.  Objective: Vital signs in last 24 hours: Temp:  [97.8 F (36.6 C)-98.8 F (37.1 C)] 98.7 F (37.1 C) (09/25 0300) Pulse Rate:  [69-87] 85 (09/25 0400) Cardiac Rhythm: Normal sinus rhythm (09/24 2000) Resp:  [9-45] 9 (09/25 0630) BP: (66-141)/(30-95) 125/61 (09/25 0630) SpO2:  [81 %-100 %] 98 % (09/25 0400) Weight:  [91.4 kg] 91.4 kg (09/25 0500)  Hemodynamic parameters for last 24 hours:    Intake/Output from previous day: 09/24 0701 - 09/25 0700 In: 1461.9 [I.V.:937; NG/GT:420; IV Piggyback:105] Out: 430 [Stool:430] Intake/Output this shift: No intake/output data recorded.  General appearance: alert and cooperative Neurologic: intact Heart: regular rate and rhythm, S1, S2 normal, no murmur, click, rub or gallop Lungs: clear to auscultation bilaterally Abdomen: soft, non-tender; bowel sounds normal; no masses,  no organomegaly Extremities: extremities normal, atraumatic, no cyanosis or edema Wound: chest wound examined and minimal debridement needed. The lower end is starting to granulate. No sign of deeper infection. The upper portion looks good and remaining retention sutures removed. The chest tube sites have not started granulating yet but no debridement needed.  Lab Results: Recent Labs    11/03/21 0641 11/04/21 0421  WBC 24.2* 25.7*  HGB 7.0* 7.5*  HCT 23.7* 24.9*  PLT 437* 432*   BMET:  Recent Labs    11/03/21 0641 11/04/21 0421  NA 134* 132*  K 4.1 4.9  CL 93* 90*  CO2 25 23  GLUCOSE 175* 133*  BUN 77* 122*  CREATININE 3.79* 5.11*  CALCIUM 9.1 9.3    PT/INR: No results for input(s): "LABPROT", "INR" in the last 72 hours. ABG    Component Value  Date/Time   PHART 7.301 (L) 10/22/2021 2354   HCO3 19.3 (L) 10/22/2021 2354   TCO2 20 (L) 10/22/2021 2354   ACIDBASEDEF 7.0 (H) 10/22/2021 2354   O2SAT 71.7 11/02/2021 0439   CBG (last 3)  Recent Labs    11/03/21 1949 11/03/21 2339 11/04/21 0355  GLUCAP 190* 210* 128*    Assessment/Plan: S/P Procedure(s) (LRB): REMOVAL OF IMPELLA LEFT VENTRICULAR ASSIST DEVICE (Right) TRANSESOPHAGEAL ECHOCARDIOGRAM (TEE) (N/A) PERCUTANEOUS TRACHEOSTOMY USING SHILEY FLEXIBLE 8 mm CUFFED TRACH. (N/A)  Hemodynamically stable on low dose NE and midodrine. Maintaining sinus rhythm.  Doing well with trach plugged.  BUN and creat rising and he will require HD today to prevent obtundation from high BUN. Needs perm cath. I don't think WBC ct is going to decrease anytime soon with open wounds, particularly sacral pressure sore. Not having any fevers.  Tolerating tube feeds at goal.   Continue PT/OT.  Will need swallowing evaluated again after decannulation.   LOS: 37 days    [name] 11/04/2021

## 2021-11-04 NOTE — TOC Initial Note (Signed)
Transition of Care [facility]) - Initial/Assessment Note    Patient Details  Name: [name] MRN: [id number] Date of Birth: Mar 07, 1960  [facility]) CM/SW Contact:    Milas Gain, [place] Phone Number: 11/04/2021, 4:46 PM  Clinical Narrative:                  CSW received consult for possible SNF placement at time of discharge. Due to patients current orientation CSW spoke with patients spouse regarding regarding PT recommendation of SNF placement at time of discharge. Patients spouse reports patient comes from home with uncle.Patient currently has barriers to placement, insurance trach,HD.CSW discussed current barriers with patients spouse. Patients spouse reports private pay currently not an option for SNF placement for patient. Patients spouse is agreeable to SNF placement for patient when medically ready for dc. Patients spouse reports she is currently working with patients employer and trying to get insurance information for patient. Patients spouse reports she is going to follow up with CSW tomorrow on patients dc plan. Patients spouse reports patient has  received the COVID vaccines as well as 1 booster.  No further questions reported at this time. CSW to continue to follow and assist with discharge planning needs.    Expected Discharge Plan: IP Rehab Facility Barriers to Discharge: Continued Medical Work up   Patient Goals and CMS Choice        Expected Discharge Plan and Services Expected Discharge Plan: [place]                                              Prior Living Arrangements/Services                       Activities of Daily Living Home Assistive Devices/Equipment: CBG Meter, Eyeglasses ADL Screening (condition at time of admission) Patient's cognitive ability adequate to safely complete daily activities?: Yes Is the patient deaf or have difficulty hearing?: No Does the patient have difficulty seeing, even when wearing  glasses/contacts?: No Does the patient have difficulty concentrating, remembering, or making decisions?: No Patient able to express need for assistance with ADLs?: Yes Does the patient have difficulty dressing or bathing?: No Independently performs ADLs?: Yes (appropriate for developmental age) Does the patient have difficulty walking or climbing stairs?: No Weakness of Legs: None Weakness of Arms/Hands: None  Permission Sought/Granted   Permission granted to share information with : Yes, Verbal Permission Granted  Share Information with NAME: Glover Capano     Permission granted to share info w Relationship: ex wife  Permission granted to share info w Contact Information: [phone]  Emotional Assessment       Orientation: : Oriented to Self      Admission diagnosis:  Symptomatic bradycardia [R00.1] Cardiomyopathy ([place]) [I42.9] S/P pericardial window creation [Z98.890] Personal history of ECMO [Z92.81] Patient Active Problem List   Diagnosis Date Noted   Acute metabolic encephalopathy    Acute anemia    Pressure injury of skin 10/13/2021   Cardiogenic shock ([place]) 10/12/2021   Pericarditis    Acute respiratory failure with hypoxia ([place])    Atypical atrial flutter ([place])    Personal history of ECMO 10/07/2021   Cardiac arrest ([place])    Cardiomyopathy ([place]) 09/28/2021   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   Transient neurologic deficit  Aphasia    Dysphasia 03/12/2021   Current use of insulin ([place]) 03/12/2021   Encephalomalacia    History of cerebral embolism with cerebral infarction 12/28/2020   AKI (acute kidney injury) ([place])    History of MI (myocardial infarction) 03/19/2018   Status 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 3 03/23/2015   Diabetes mellitus type 2 with neurological manifestations ([place])    Tobacco abuse    Obesity    Hypertension associated with diabetes ([place]) 03/16/2015   Hyperlipidemia  associated with type 2 diabetes mellitus ([place]) 03/16/2015   PCP:  [name], MD Pharmacy:   [facility] [address], [place] [address] Phone: [phone] Fax: [facility] [address] [place] [address] Phone: [phone] Fax: [phone]     Social Determinants of Health (SDOH) Interventions    Readmission Risk Interventions    09/20/2021   11:57 AM  Readmission Risk Prevention Plan  Post Dischage Appt Complete  Medication Screening Complete  Transportation Screening Complete

## 2021-11-04 NOTE — Progress Notes (Signed)
Physical Therapy Treatment Patient Details Name: [name] MRN: [id number] DOB: [birth year] Today's Date: 11/04/2021   History of Present Illness Pt is a 61 y.o. male admitted 09/28/21 with SOB, bradycardia, hypotension. Workup for bilateral pleural effusion, pericardial effusion s/p VATS with pericardial window 8/19. Cardiac arrest with tamponade 8/20, intubated with bedside mediastinal exploration, ECMO via fem access. Pt with mediastinal hemorrhage s/p reexploration 8/21. To OR 8/28 for washout, ECMO decannulation, Impella placement. CRRT initiated 8/28. Brief PEA arrest 8/29 with DCCV to NSR.  S/p chest closure 9/2. S/p trach and impella removed 9/7. S/p wound vac removal 9/11. Course complicated by bouts of hypotension and bradycardia. Off CRRT 9/19. Trach plugged 11/03/21. Plan for tunneled HD cath 11/04/21. PMH includes CAD (s/p recent CABG 09/12/21), CKD2, DM, HTN, AKI, afib, CVA.    PT Comments    Patient progressing slowly towards PT goals. Session focused on standing bouts in stedy frame requiring Mod A of 2 for assist. Able to sit EOB today with Min guard assist in midline positioning/posture, which is improvement from prior sessions. Continues to have left lateral lean more prominent in standing and difficulty with maintaining RUE on stedy bars without max cues/attention. Pt remains limited by generalized weakness, decreased activity tolerance, pain, impaired balance and impaired cognition. POC updated. Will continue to follow.     Recommendations for follow up therapy are one component of a multi-disciplinary discharge planning process, led by the attending physician.  Recommendations may be updated based on patient status, additional functional criteria and insurance authorization.  Follow Up Recommendations  Skilled nursing-short term rehab (<3 hours/day) Can patient physically be transported by private vehicle: No   Assistance Recommended at Discharge Frequent or constant  Supervision/Assistance  Patient can return home with the following Two people to help with walking and/or transfers;Two people to help with bathing/dressing/bathroom;Direct supervision/assist for medications management;Assistance with feeding;Assistance with cooking/housework;Direct supervision/assist for financial management;Assist for transportation   Equipment Recommendations  Other (comment) (defer to next venue)    Recommendations for Other Services       Precautions / Restrictions Precautions Precautions: Sternal;Fall;Other (comment) Precaution Booklet Issued: No Precaution Comments: trach collar, cortrak, flexiseal; R side intattention Restrictions Weight Bearing Restrictions: No Other Position/Activity Restrictions: initial CABG 09/12/21     Mobility  Bed Mobility Overal bed mobility: Needs Assistance Bed Mobility: Rolling, Sidelying to Sit Rolling: Mod assist Sidelying to sit: Mod assist, +2 for physical assistance, HOB elevated       General bed mobility comments: Step by step cues for sequencing, to reach for rail on right ,assist with bringing LEs off bed and to elevate trunk. Increased time.    Transfers Overall transfer level: Needs assistance Equipment used: Ambulation equipment used Transfers: Sit to/from Stand, Bed to chair/wheelchair/BSC Sit to Stand: Mod assist, +2 physical assistance           General transfer comment: mod A of 2 to stand from EOB x2 with left lateral lean and flexed posture despite cues. assist for RUE placement on stedy as hands keep slipping off wihtout attention. Assist for trunk elevation. mod A of 2 fromstedy seat x2, from chair as well x1. Transfer via Lift Equipment: Stedy  Ambulation/Gait               General Gait Details: unable   Stairs             Wheelchair Mobility    Modified Rankin (Stroke Patients Only)       Balance Overall balance  assessment: Needs assistance Sitting-balance support: Feet  supported, No upper extremity supported Sitting balance-Leahy Scale: Fair Sitting balance - Comments: Able to sit statically without UE support in midline positioning   Standing balance support: During functional activity, Bilateral upper extremity supported Standing balance-Leahy Scale: Zero Standing balance comment: reliant on UE support and external assist for static standing in stedy frame, bilateral knee instability and left lateral lean                            Cognition Arousal/Alertness: Awake/alert Behavior During Therapy: Flat affect Overall Cognitive Status: Impaired/Different from baseline Area of Impairment: Attention, Memory, Following commands, Safety/judgement, Awareness, Problem solving                   Current Attention Level: Sustained   Following Commands: Follows one step commands with increased time, Follows one step commands consistently Safety/Judgement: Decreased awareness of safety, Decreased awareness of deficits Awareness: Intellectual, Emergent Problem Solving: Slow processing, Decreased initiation, Difficulty sequencing, Requires verbal cues, Requires tactile cues General Comments: Increased time to follow commands with frequent cues esp for safety using stedy.        Exercises      General Comments General comments (skin integrity, edema, etc.): Wound on buttocks, placed geomat in chair and RN placed cream at end of session on wound.      Pertinent Vitals/Pain Pain Assessment Pain Assessment: Faces Faces Pain Scale: Hurts worst Pain Location: buttocks Pain Descriptors / Indicators: Grimacing, Sore, Discomfort, Moaning, Constant Pain Intervention(s): Monitored during session, Patient requesting pain meds-RN notified, Limited activity within patient's tolerance, Repositioned    Home Living                          Prior Function            PT Goals (current goals can now be found in the care plan section)  Acute Rehab PT Goals PT Goal Formulation: With patient Time For Goal Achievement: 11/18/21 Potential to Achieve Goals: Fair Progress towards PT goals: Progressing toward goals    Frequency    Min 3X/week      PT Plan Current plan remains appropriate    Co-evaluation              AM-PAC PT "6 Clicks" Mobility   Outcome Measure  Help needed turning from your back to your side while in a flat bed without using bedrails?: A Lot Help needed moving from lying on your back to sitting on the side of a flat bed without using bedrails?: A Lot Help needed moving to and from a bed to a chair (including a wheelchair)?: Total Help needed standing up from a chair using your arms (e.g., wheelchair or bedside chair)?: Total Help needed to walk in hospital room?: Total Help needed climbing 3-5 steps with a railing? : Total 6 Click Score: 8    End of Session Equipment Utilized During Treatment: Gait belt Activity Tolerance: Patient limited by pain Patient left: in chair;with call bell/phone within reach;with restraints reapplied;with nursing/sitter in room Nurse Communication: Mobility status;Need for lift equipment (stedy/maxi) PT Visit Diagnosis: Other abnormalities of gait and mobility (R26.89);Difficulty in walking, not elsewhere classified (R26.2);Muscle weakness (generalized) (M62.81)     Time: 9476-5465 PT Time Calculation (min) (ACUTE ONLY): 29 min  Charges:  $Therapeutic Activity: 23-37 mins  Zettie Cooley, DPT Acute Rehabilitation Services Secure chat preferred Office [place] 11/04/2021, 10:07 AM

## 2021-11-04 NOTE — TOC CM/SW Note (Signed)
HF TOC CM received notification from Financial Counselor/[employer], COBRA is inactive. Financial couselor will proceed with Medicaid application. [place], Heart Failure TOC CM [phone]

## 2021-11-04 NOTE — Progress Notes (Signed)
Patient was de cannulated at 12:05 per CCM order. Gauze and soft tape was placed over the stoma site. Patient remains on room air with SpO2 of 97%. No distress noted at this time. RN aware.

## 2021-11-04 NOTE — Progress Notes (Signed)
SLP Cancellation Note  Patient Details Name: ZAY YEARGAN MRN: [id number] DOB: [birth year]   Cancelled treatment:       Reason Eval/Treat Not Completed: Medical issues which prohibited therapy. Pt NPO awaiting possible procedure and doing trach capping trials. Will f/u as able to resume POs.     Osie Bond., M.A. [place] Office [phone]  Secure chat preferred  11/04/2021, 10:33 AM

## 2021-11-04 NOTE — Procedures (Signed)
Interventional Radiology Procedure:   Indications: Acute kidney injury   Procedure: Tunneled dialysis catheter placement  Findings: Left jugular Palindrome, tip at SVC/RA junction  Complications: No immediate complications noted.     EBL: Minimal  Plan: Dialysis catheter is ready to use.    Jahmil Macleod R. Anselm Pancoast, MD  Pager: [phone]

## 2021-11-04 NOTE — Progress Notes (Addendum)
Patient ID: [name], male   DOB: January 09, 1961, 61 y.o.   MRN: [id number]   Advanced Heart Failure Rounding Note   Subjective:    - 8/19 Pericardial window - 8/20 Cardiac arrest with tamponade -> Emergent bedside washout - 09/29/21 VA Cannulation - 09/30/21 Return to OR for mediastinal hemorrhage - 10/01/21 Developed AF -> amio - 10/02/21 TEE EF 25-30%  - 10/04/21 OR for washout. C/b continued bleeding overnight - 10/07/21 Placement of Impella 5.5 with washout, VA ECMO decannulation. Hypotensive with development of severe RV dysfunction after ECMO off and pressors titrated up. Multiple units of blood products. - 10/08/21 Brief PEA arrest. AFL with RVR >> S/p DCCV to SR, back in AFL shortly after - 8/31 Give 1UPRBCs  - 9/2 OR for chest closure - 9/3 Hypotensive overnight w/ SBPs in 80s. Febrile, mTemp 100.8. CRRT paused. VP increased to 0.04.  - 9/4 s/p bronchoscopy w/ BAL by PCCM, Cx NGTD  - 9/5 vomiting w/ large volume NGT output + watery/foul diarrhea, Tube feeds held. No signs of ileus on KUB. C-diff negative   - 9/7 OR for Impella Extraction and percutaneous tracheostomy. 2 u RBCs. - 9/8 CVVH stopped and line removed for holiday - 9/9 CVVH restarted.  -9/13 Worsening leukocytosis and pressor requirements. Purulent drainage from arterial line. Started Daptomycin, Micafungin and Meropenem. - 9/17 Daptomycin + Micafungin stopped. Remains on Meropenum  -9/19 CRRT holiday.  -9/20 Trach downsized  -9/21 transitioned to [facility]   Alert and no distress. Temp HD cath removed, getting perm cath placed by IR today.   Trach plugged.   On NE 3 + midodrine 15 mg bid.   NSR on tele.   Objective:     Vital Signs:   Temp:  [97.8 F (36.6 C)-98.8 F (37.1 C)] 98.7 F (37.1 C) (09/25 0300) Pulse Rate:  [69-87] 85 (09/25 0400) Resp:  [9-45] 9 (09/25 0630) BP: (66-141)/(30-95) 125/61 (09/25 0630) SpO2:  [81 %-100 %] 98 % (09/25 0400) Weight:  [91.4 kg] 91.4 kg (09/25 0500) Last BM Date :  11/02/21  Weight change: Filed Weights   11/02/21 1208 11/03/21 0500 11/04/21 0500  Weight: 84.6 kg 84.9 kg 91.4 kg    Intake/Output:   Intake/Output Summary (Last 24 hours) at 11/04/2021 0823 Last data filed at 11/04/2021 0659 Gross per 24 hour  Intake 1364.72 ml  Output 430 ml  Net 934.72 ml   PHYSICAL EXAM: General:  chronically ill appearing. No respiratory difficulty HEENT: normal + cor trak  Neck: supple. no JVD. + trach, Carotids 2+ bilat; no bruits. No lymphadenopathy or thyromegaly appreciated. Cor: PMI nondisplaced. Regular rate & rhythm. + sternal dressing  Lungs: clear Abdomen: soft, nontender, nondistended. No hepatosplenomegaly. No bruits or masses. Good bowel sounds. Extremities: no cyanosis, clubbing, rash, edema Neuro: alert & oriented x 3, cranial nerves grossly intact. moves all 4 extremities w/o difficulty. Affect pleasant.   Telemetry: SR 80s   Labs: Basic Metabolic Panel: Recent Labs  Lab 10/31/21 0506 10/31/21 1624 11/01/21 0344 11/02/21 0439 11/03/21 0641 11/04/21 0421  NA 144 141 136 131* 134* 132*  K 5.2* 5.6* 3.8 3.5 4.1 4.9  CL 105 109 94* 92* 93* 90*  CO2 20* 19* _0 GLUCOSE 110* 121* 107* 219* 175* 133*  BUN 98* 115* 65* 114* 77* 122*  CREATININE 5.75* 6.21* 3.44* 5.53* 3.79* 5.11*  CALCIUM 9.9 9.0 9.2 9.1 9.1 9.3  MG 3.0*  --  2.0 2.4 2.0 2.2  PHOS 6.8*  7.0* 4.4 6.5* 2.7 3.0    Liver Function Tests: Recent Labs  Lab 10/31/21 1624 11/01/21 0344 11/02/21 0439 11/03/21 0641 11/04/21 0421  ALBUMIN 2.2* 2.6* 2.6* 2.7* 2.7*   No results for input(s): "LIPASE", "AMYLASE" in the last 168 hours. No results for input(s): "AMMONIA" in the last 168 hours.  CBC: Recent Labs  Lab 10/31/21 0506 11/01/21 0344 11/02/21 0439 11/03/21 0641 11/04/21 0421  WBC 25.5* 24.1* 24.7* 24.2* 25.7*  HGB 8.0* 7.5* 7.3* 7.0* 7.5*  HCT 27.4* 24.9* 24.0* 23.7* 24.9*  MCV 100.0 98.4 99.2 100.9* 98.0  PLT 390 426* 445* 437* 432*     Cardiac Enzymes: No results for input(s): "CKTOTAL", "CKMB", "CKMBINDEX", "TROPONINI" in the last 168 hours.   BNP: BNP (last 3 results) Recent Labs    12/24/20 0651 09/28/21 0405  BNP 412.0* 826.6*    ProBNP (last 3 results) No results for input(s): "PROBNP" in the last 8760 hours.    Other results:  Imaging: VAS [place] LOWER EXTREMITY VENOUS (DVT)  Result Date: 11/03/2021  Lower Venous DVT Study Patient Name:  [name] [facility]  Date of Exam:   11/03/2021 Medical Rec #: [id number]     Accession #:    [id number] Date of Birth: [birth year]     Patient Gender: M Patient Age:   78 years Exam Location:  [facility] Procedure:      VAS [place] LOWER EXTREMITY VENOUS (DVT) Referring Phys: Hayden Pedro --------------------------------------------------------------------------------  Indications: Edema.  Limitations: Patient not cooperative. Comparison Study: No prior study Performing Technologist: Sharion Dove RVS  Examination Guidelines: A complete evaluation includes B-mode imaging, spectral Doppler, color Doppler, and power Doppler as needed of all accessible portions of each vessel. Bilateral testing is considered an integral part of a complete examination. Limited examinations for reoccurring indications may be performed as noted. The reflux portion of the exam is performed with the patient in reverse Trendelenburg.  +--------+---------------+---------+-----------+----------------+-------------+ RIGHT   CompressibilityPhasicitySpontaneityProperties      Thrombus                                                                 Aging         +--------+---------------+---------+-----------+----------------+-------------+ CFV     Full                               pulsatile                                                                waveforms                     +--------+---------------+---------+-----------+----------------+-------------+ SFJ     Full                                                              +--------+---------------+---------+-----------+----------------+-------------+  FV Prox Full                                                             +--------+---------------+---------+-----------+----------------+-------------+ FV Mid  Full                                                             +--------+---------------+---------+-----------+----------------+-------------+ FV      Full                                                             Distal                                                                   +--------+---------------+---------+-----------+----------------+-------------+ PFV     Full                                                             +--------+---------------+---------+-----------+----------------+-------------+ POP     Full                               pulsatile                                                                waveforms                     +--------+---------------+---------+-----------+----------------+-------------+   +---------+---------------+---------+-----------+---------------+--------------+ LEFT     CompressibilityPhasicitySpontaneityProperties     Thrombus Aging +---------+---------------+---------+-----------+---------------+--------------+ CFV                                         pulsatile      patent by                                                  waveforms      color and  Doppler        +---------+---------------+---------+-----------+---------------+--------------+ FV Prox  Full                                                             +---------+---------------+---------+-----------+---------------+--------------+ FV Mid   Full                                                              +---------+---------------+---------+-----------+---------------+--------------+ FV DistalFull                                                             +---------+---------------+---------+-----------+---------------+--------------+ PFV      Full                                                             +---------+---------------+---------+-----------+---------------+--------------+ POP      Full                               pulsatile                                                                 waveforms                     +---------+---------------+---------+-----------+---------------+--------------+ PTV      Full                                                             +---------+---------------+---------+-----------+---------------+--------------+ PERO     Full                                                             +---------+---------------+---------+-----------+---------------+--------------+    Summary: BILATERAL: - No evidence of deep vein thrombosis seen in the lower extremities, bilaterally. -No evidence of popliteal cyst, bilaterally.   *See table(s) above for measurements and observations.    Preliminary    VAS [place] UPPER EXTREMITY VENOUS DUPLEX  Result Date: 11/03/2021 UPPER VENOUS STUDY  Patient Name:  [name] [facility]  Date of Exam:   11/03/2021 Medical Rec #:  [id number]     Accession #:    [id number] Date of Birth: May 12, 1960     Patient Gender: M Patient Age:   67 years Exam Location:  [facility] Procedure:      VAS [place] UPPER EXTREMITY VENOUS DUPLEX Referring Phys: Hayden Pedro --------------------------------------------------------------------------------  Indications: Edema Limitations: Trach collar, line and bandages. Comparison Study: No prior study Performing Technologist: Sharion Dove RVS  Examination Guidelines: A complete evaluation includes B-mode imaging, spectral Doppler, color Doppler, and power Doppler as needed of  all accessible portions of each vessel. Bilateral testing is considered an integral part of a complete examination. Limited examinations for reoccurring indications may be performed as noted.  Right Findings: +----------+------------+---------+-----------+----------+--------------+ RIGHT     CompressiblePhasicitySpontaneousProperties   Summary     +----------+------------+---------+-----------+----------+--------------+ IJV                                                 Not visualized +----------+------------+---------+-----------+----------+--------------+ Subclavian               Yes       Yes                             +----------+------------+---------+-----------+----------+--------------+ Axillary                 Yes       Yes                             +----------+------------+---------+-----------+----------+--------------+ Brachial      Full                                                 +----------+------------+---------+-----------+----------+--------------+ Radial        Full                                                 +----------+------------+---------+-----------+----------+--------------+ Ulnar         Full                                                 +----------+------------+---------+-----------+----------+--------------+ Cephalic      Full                                                 +----------+------------+---------+-----------+----------+--------------+ Basilic       Full                                                 +----------+------------+---------+-----------+----------+--------------+  Left Findings: +----------+------------+---------+-----------+----------+--------------+ LEFT      CompressiblePhasicitySpontaneousProperties   Summary     +----------+------------+---------+-----------+----------+--------------+ IJV  Not visualized  +----------+------------+---------+-----------+----------+--------------+ Subclavian               Yes       Yes                             +----------+------------+---------+-----------+----------+--------------+ Axillary                 Yes       Yes                             +----------+------------+---------+-----------+----------+--------------+ Brachial      Full                                                 +----------+------------+---------+-----------+----------+--------------+ Radial        Full                                                 +----------+------------+---------+-----------+----------+--------------+ Ulnar                                               Not visualized +----------+------------+---------+-----------+----------+--------------+ Cephalic      Full                                                 +----------+------------+---------+-----------+----------+--------------+ Basilic                                             Not visualized +----------+------------+---------+-----------+----------+--------------+  Summary:  Right: No evidence of deep vein thrombosis in the visualized veins of the upper extremity. No evidence of superficial vein thrombosis in the upper extremity.  Left: No evidence of deep vein thrombosis in the visualized veins of the upper extremity. No evidence of superficial vein thrombosis in the upper extremity.  *See table(s) above for measurements and observations.    Preliminary    DG CHEST PORT 1 VIEW  Result Date: 11/02/2021 CLINICAL DATA:  Respiratory failure. EXAM: PORTABLE CHEST 1 VIEW COMPARISON:  October 29, 2021 FINDINGS: Stable support apparatus. Enlarged cardiac silhouette. Bibasilar, left greater than right streaky airspace opacities. Osseous structures are without acute abnormality. Soft tissues are grossly normal. IMPRESSION: Bibasilar, left greater than right streaky airspace opacities may  represent atelectasis or airspace consolidation. Electronically Signed   By: Fidela Salisbury M.D.   On: 11/02/2021 14:26     Medications:     Scheduled Medications:  ascorbic acid  250 mg Per Tube BID   aspirin  81 mg Per Tube Daily   Chlorhexidine Gluconate Cloth  6 each Topical Daily   collagenase   Topical Daily   darbepoetin (ARANESP) injection - NON-DIALYSIS  100 mcg Subcutaneous Q Mon-1800   feeding supplement (PROSource TF20)  60 mL Per Tube TID  fiber  1 packet Per Tube BID   folic acid  1 mg Per Tube Daily   Gerhardt's butt cream   Topical Daily   heparin injection (subcutaneous)  5,000 Units Subcutaneous Q8H   insulin aspart  0-20 Units Subcutaneous Q4H   insulin aspart  2 Units Subcutaneous Q4H   insulin glargine-yfgn  10 Units Subcutaneous QHS   midodrine  15 mg Per Tube TID WC   multivitamin  1 tablet Per Tube QHS   nutrition supplement (JUVEN)  1 packet Per Tube BID BM   mouth rinse  15 mL Mouth Rinse 4 times per day   pantoprazole  40 mg Per Tube BID   QUEtiapine  50 mg Per Tube QHS   sodium chloride flush  3 mL Intravenous Q12H   thiamine  100 mg Per Tube Daily   zinc sulfate  220 mg Per Tube Daily    Infusions:  sodium chloride 10 mL/hr at 11/04/21 0659   sodium chloride 10 mL/hr at 11/04/21 0659   albumin human 12.5 g (11/01/21 2248)   anticoagulant sodium citrate      ceFAZolin (ANCEF) IV     micafungin (MYCAMINE) 100 mg in sodium chloride 0.9 % 100 mL IVPB Stopped (11/03/21 1346)   norepinephrine (LEVOPHED) Adult infusion 4 mcg/min (11/04/21 0659)    PRN Medications: sodium chloride, acetaminophen (TYLENOL) oral liquid 160 mg/5 mL, albumin human, anticoagulant sodium citrate, camphor-menthol, clonazePAM, dextrose, diphenhydrAMINE, heparin, HYDROmorphone (DILAUDID) injection, ipratropium-albuterol, ondansetron (ZOFRAN) IV, mouth rinse, [EXPIRED] oxyCODONE **FOLLOWED BY** oxyCODONE, polyethylene glycol, polyvinyl alcohol, sodium chloride flush, white  petrolatum   Assessment/Plan:    1. Shock - mixed cardiogenic/hemorrhagic -> VA ECMO -> decannulated on 8/28 to Impella 5.5 - Echo 08/28: Underfilled LV, EF 55-60% with severe LVH and near normal RV systsystolic function.  - Impella extracted 9/7 -Bedside echo 09/13 - LV function preserved, RV mildly reduced. Small clot in posterior pericardium but no hemodynamic effect - Concern for worsening sepsis with leukocytosis and increasing pressor requirements on 09/13. Purulent drainage from [place], replaced. - BC X 2 NGTD, tracheal aspirate pending. Procalcitonin 1.63.  - Given prolonged abx course earlier in stay, addition of Daptomycin, Micafungin and meropenem recommended per ID.  - Plan to complete total of 14 days of Micafungin .  - On midodrine 15 mg tid. Titrate to 20 tid to help wean off NE    2. Cardiac arrest (PEA/bradycardic) - 8/19 in setting of tamponade - PEA arrest again on 08/28  3. Cardiac tamponade with emergent bedside sternotomy  - Diffuse epicardial bleeding with post-op Dresslers - return to OR 8/21 and 8/24 for washouts.  - Washout 8/28 in OR. Multiple units of blood products in OR.   - Chest closed on 9/2 - Hgb 8.1>7.6>8.2> 8>7.5   4. Acute hypoxemic respiratory failure - Off ECMO.  -  Perc Trach placed 9/7  - s/p bronchoscopy w/ BAL 9/4.  - tracheal aspirate 09/13 growing few GPC and rare yeast  - Started back on IV abx as above d/t concern for sepsis.  - Trach downsized - Management per CCM  5. AKI due to ATN - CRRT started 08/28.  - Remains on anuric - 9/19 given CRRT holiday.  - Transitioned to [facility] 9/21. 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 Now off colchicine   7. 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 Continue aspirin 81 mg daily  8. DM2 - continue SSI   9. PAF/AFL - Tolerates poorly.  - Recurrent AFL. S/p DCCV  to SR 08/29.  - Off amio d/t  bradycardia - Not on [facility] for now d/t bleeding, DVT dose heparin.  - In NSR   10. ID - Completed vancomycin/meropenem with open chest.  - WBC 17.2 >23> 26>23>>24>>25. ? Possible aspiration with vomiting.  - now off meropenum    11. Neuro Oriented x3  12. FEN - TFs ongoing .   13. Hyperkalemia - resolved w/ HD   14. Unstageable Pressure Ulcer, Buttock  - WOC following.   15. Abdominal Pain - Resolved.   Not eligible for LTAC given insurance. CM trying for SNF   Lyda Jester, PA-C  8:23 AM  Agree with above.   Back on NE for BP support. Trach plugged. Trialysis cath out. Pending tunneled HD cath. Remains on TFs. Failed swallow study. Remains on micafungin.   General:  Chronically ill appearing. No resp difficulty HEENT: normal + cor-trak Neck: supple. no JVD. + trach Carotids 2+ bilat; no bruits. No lymphadenopathy or thryomegaly appreciated. Cor: Sternal dressing ok Regular rate & rhythm. No rubs, gallops or murmurs. Lungs: clear Abdomen: soft, nontender, nondistended. No hepatosplenomegaly. No bruits or masses. Good bowel sounds. Extremities: no cyanosis, clubbing, rash, edema Neuro: alert & orientedx3, cranial nerves grossly intact. moves all 4 extremities w/o difficulty. Affect pleasant  Remains NE dependent. Midodrine increased to 20 tid to help support BP. Will place compression stockings. Place tunneled cath for HD today. Continue PT/OT and aggressive wound care.   CRITICAL CARE Performed by: Glori Bickers  Total critical care time: 35 minutes  Critical care time was exclusive of separately billable procedures and treating other patients.  Critical care was necessary to treat or prevent imminent or life-threatening deterioration.  Critical care was time spent personally by me (independent of midlevel providers or residents) on the following activities: development of treatment plan with patient and/or surrogate as well as nursing, discussions with  consultants, evaluation of patient's response to treatment, examination of patient, obtaining history from patient or surrogate, ordering and performing treatments and interventions, ordering and review of laboratory studies, ordering and review of radiographic studies, pulse oximetry and re-evaluation of patient's condition.  Glori Bickers, MD  8:59 AM

## 2021-11-04 NOTE — Progress Notes (Signed)
Subjective:   For tunneled HD cath today with IR No interval events, remains on NE  Objective Vital signs in last 24 hours: Vitals:   11/04/21 0845 11/04/21 0900 11/04/21 0915 11/04/21 0930  BP: (!) 97/58 116/60 (!) 100/48 (!) 105/58  Pulse: 80 84 79 79  Resp: (!) 28 (!) 34 17 (!) 24  Temp:      TempSrc:      SpO2: 98% 98% 95% 96%  Weight:      Height:       Weight change: 6.8 kg  Intake/Output Summary (Last 24 hours) at 11/04/2021 1046 Last data filed at 11/04/2021 0800 Gross per 24 hour  Intake 1227.21 ml  Output 430 ml  Net 797.21 ml    Assessment/Plan:     61 y.o. yo male  with HTN, HLD, DM, A-fib, stroke, CKD, CAD status post CABG on 8/3 presented with SOB, found cardiac tamponade, course complicated by cardiac arrest, use of ECMO, seen as a consultation for AKI    #Dialysis dependent Acute kidney injury on CKD IIIa.   Ischemic ATN due to cardiogenic shock/cardiac arrest.   Started CRRT on 8/28 for decreasing urine output and fluid/volume management.  -  stopped CRRT on 9/19-   tolerated IHD on 9/21 and 9/23 For [facility] with IR 9/25 HD thereafter today. 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  decannulated  with placement of Impella.  S/p closure of the sternum and mediastinal washout on 9/2.   #Cardiogenic shock/cardiac arrest: Was on ECMO.  Pressors and midodrine per primary/AHF team.  Impella out on 9/7- remarkable improvement -     #Acute hypoxic respiratory failure:  improving - now on TC.    #Acute blood loss anemia: Monitor hemoglobin and transfuse as needed by ICU team.  Aranesp 100 weekly. 9/25 TSAT 10% and Ferritin 215, start IV Fe  Heliodoro Domagalski Modesta Messing    Labs: Basic Metabolic Panel: Recent Labs  Lab 11/02/21 0439 11/03/21 0641 11/04/21 0421  NA 131* 134* 132*  K 3.5 4.1 4.9  CL 92* 93* 90*  CO2 23 25 23   GLUCOSE 219* 175* 133*  BUN 114* 77* 122*   CREATININE 5.53* 3.79* 5.11*  CALCIUM 9.1 9.1 9.3  PHOS 6.5* 2.7 3.0    Liver Function Tests: Recent Labs  Lab 11/02/21 0439 11/03/21 0641 11/04/21 0421  ALBUMIN 2.6* 2.7* 2.7*    No results for input(s): "LIPASE", "AMYLASE" in the last 168 hours. No results for input(s): "AMMONIA" in the last 168 hours. CBC: Recent Labs  Lab 10/31/21 0506 11/01/21 0344 11/02/21 0439 11/03/21 0641 11/04/21 0421  WBC 25.5* 24.1* 24.7* 24.2* 25.7*  HGB 8.0* 7.5* 7.3* 7.0* 7.5*  HCT 27.4* 24.9* 24.0* 23.7* 24.9*  MCV 100.0 98.4 99.2 100.9* 98.0  PLT 390 426* 445* 437* 432*    Cardiac Enzymes: No results for input(s): "CKTOTAL", "CKMB", "CKMBINDEX", "TROPONINI" in the last 168 hours.  CBG: Recent Labs  Lab 11/03/21 1540 11/03/21 1949 11/03/21 2339 11/04/21 0355 11/04/21 0904  GLUCAP 171* 190* 210* 128* 137*     Iron Studies:  Recent Labs    11/04/21 0421  IRON 39*  TIBC 402  FERRITIN 215   Studies/Results: VAS [place] LOWER EXTREMITY VENOUS (DVT)  Result Date: 11/03/2021  Lower Venous DVT Study Patient Name:  ADD DINAPOLI [facility]  Date of Exam:   11/03/2021 Medical Rec #: [id number]     Accession #:  [id number] Date of Birth: 06-29-1960     Patient Gender: M Patient Age:   61 years Exam Location:  [facility] Procedure:      VAS [place] LOWER EXTREMITY VENOUS (DVT) Referring Phys: Hayden Pedro --------------------------------------------------------------------------------  Indications: Edema.  Limitations: Patient not cooperative. Comparison Study: No prior study Performing Technologist: Sharion Dove RVS  Examination Guidelines: A complete evaluation includes B-mode imaging, spectral Doppler, color Doppler, and power Doppler as needed of all accessible portions of each vessel. Bilateral testing is considered an integral part of a complete examination. Limited examinations for reoccurring indications may be performed as noted. The reflux portion of the exam is performed with the  patient in reverse Trendelenburg.  +--------+---------------+---------+-----------+----------------+-------------+ RIGHT   CompressibilityPhasicitySpontaneityProperties      Thrombus                                                                 Aging         +--------+---------------+---------+-----------+----------------+-------------+ CFV     Full                               pulsatile                                                                waveforms                     +--------+---------------+---------+-----------+----------------+-------------+ SFJ     Full                                                             +--------+---------------+---------+-----------+----------------+-------------+ FV Prox Full                                                             +--------+---------------+---------+-----------+----------------+-------------+ FV Mid  Full                                                             +--------+---------------+---------+-----------+----------------+-------------+ FV      Full                                                             Distal                                                                   +--------+---------------+---------+-----------+----------------+-------------+  PFV     Full                                                             +--------+---------------+---------+-----------+----------------+-------------+ POP     Full                               pulsatile                                                                waveforms                     +--------+---------------+---------+-----------+----------------+-------------+   +---------+---------------+---------+-----------+---------------+--------------+ LEFT     CompressibilityPhasicitySpontaneityProperties     Thrombus Aging  +---------+---------------+---------+-----------+---------------+--------------+ CFV                                         pulsatile      patent by                                                  waveforms      color and                                                                 Doppler        +---------+---------------+---------+-----------+---------------+--------------+ FV Prox  Full                                                             +---------+---------------+---------+-----------+---------------+--------------+ FV Mid   Full                                                             +---------+---------------+---------+-----------+---------------+--------------+ FV DistalFull                                                             +---------+---------------+---------+-----------+---------------+--------------+ PFV      Full                                                             +---------+---------------+---------+-----------+---------------+--------------+  POP      Full                               pulsatile                                                                 waveforms                     +---------+---------------+---------+-----------+---------------+--------------+ PTV      Full                                                             +---------+---------------+---------+-----------+---------------+--------------+ PERO     Full                                                             +---------+---------------+---------+-----------+---------------+--------------+    Summary: BILATERAL: - No evidence of deep vein thrombosis seen in the lower extremities, bilaterally. -No evidence of popliteal cyst, bilaterally.   *See table(s) above for measurements and observations.    Preliminary    VAS [place] UPPER EXTREMITY VENOUS DUPLEX  Result Date: 11/03/2021 UPPER VENOUS STUDY  Patient Name:   EITHAN BEAGLE [facility]  Date of Exam:   11/03/2021 Medical Rec #: [id number]     Accession #:    [id number] Date of Birth: 1960-07-05     Patient Gender: M Patient Age:   59 years Exam Location:  [facility] Procedure:      VAS [place] UPPER EXTREMITY VENOUS DUPLEX Referring Phys: Hayden Pedro --------------------------------------------------------------------------------  Indications: Edema Limitations: Trach collar, line and bandages. Comparison Study: No prior study Performing Technologist: Sharion Dove RVS  Examination Guidelines: A complete evaluation includes B-mode imaging, spectral Doppler, color Doppler, and power Doppler as needed of all accessible portions of each vessel. Bilateral testing is considered an integral part of a complete examination. Limited examinations for reoccurring indications may be performed as noted.  Right Findings: +----------+------------+---------+-----------+----------+--------------+ RIGHT     CompressiblePhasicitySpontaneousProperties   Summary     +----------+------------+---------+-----------+----------+--------------+ IJV                                                 Not visualized +----------+------------+---------+-----------+----------+--------------+ Subclavian               Yes       Yes                             +----------+------------+---------+-----------+----------+--------------+ Axillary                 Yes       Yes                             +----------+------------+---------+-----------+----------+--------------+  Brachial      Full                                                 +----------+------------+---------+-----------+----------+--------------+ Radial        Full                                                 +----------+------------+---------+-----------+----------+--------------+ Ulnar         Full                                                  +----------+------------+---------+-----------+----------+--------------+ Cephalic      Full                                                 +----------+------------+---------+-----------+----------+--------------+ Basilic       Full                                                 +----------+------------+---------+-----------+----------+--------------+  Left Findings: +----------+------------+---------+-----------+----------+--------------+ LEFT      CompressiblePhasicitySpontaneousProperties   Summary     +----------+------------+---------+-----------+----------+--------------+ IJV                                                 Not visualized +----------+------------+---------+-----------+----------+--------------+ Subclavian               Yes       Yes                             +----------+------------+---------+-----------+----------+--------------+ Axillary                 Yes       Yes                             +----------+------------+---------+-----------+----------+--------------+ Brachial      Full                                                 +----------+------------+---------+-----------+----------+--------------+ Radial        Full                                                 +----------+------------+---------+-----------+----------+--------------+ Ulnar  Not visualized +----------+------------+---------+-----------+----------+--------------+ Cephalic      Full                                                 +----------+------------+---------+-----------+----------+--------------+ Basilic                                             Not visualized +----------+------------+---------+-----------+----------+--------------+  Summary:  Right: No evidence of deep vein thrombosis in the visualized veins of the upper extremity. No evidence of superficial vein thrombosis in the upper  extremity.  Left: No evidence of deep vein thrombosis in the visualized veins of the upper extremity. No evidence of superficial vein thrombosis in the upper extremity.  *See table(s) above for measurements and observations.    Preliminary    DG CHEST PORT 1 VIEW  Result Date: 11/02/2021 CLINICAL DATA:  Respiratory failure. EXAM: PORTABLE CHEST 1 VIEW COMPARISON:  October 29, 2021 FINDINGS: Stable support apparatus. Enlarged cardiac silhouette. Bibasilar, left greater than right streaky airspace opacities. Osseous structures are without acute abnormality. Soft tissues are grossly normal. IMPRESSION: Bibasilar, left greater than right streaky airspace opacities may represent atelectasis or airspace consolidation. Electronically Signed   By: Fidela Salisbury M.D.   On: 11/02/2021 14:26   Medications: Infusions:  sodium chloride 10 mL/hr at 11/04/21 0659   sodium chloride 10 mL/hr at 11/04/21 0659   albumin human 12.5 g (11/01/21 2248)   anticoagulant sodium citrate      ceFAZolin (ANCEF) IV     micafungin (MYCAMINE) 100 mg in sodium chloride 0.9 % 100 mL IVPB 100 mg (11/04/21 1021)   norepinephrine (LEVOPHED) Adult infusion 4 mcg/min (11/04/21 0659)    Scheduled Medications:  ascorbic acid  250 mg Per Tube BID   aspirin  81 mg Per Tube Daily   Chlorhexidine Gluconate Cloth  6 each Topical Daily   collagenase   Topical Daily   darbepoetin (ARANESP) injection - NON-DIALYSIS  100 mcg Subcutaneous Q Mon-1800   feeding supplement (PROSource TF20)  60 mL Per Tube TID   fiber  1 packet Per Tube BID   folic acid  1 mg Per Tube Daily   Gerhardt's butt cream   Topical Daily   heparin injection (subcutaneous)  5,000 Units Subcutaneous Q8H   insulin aspart  0-20 Units Subcutaneous Q4H   insulin aspart  2 Units Subcutaneous Q4H   insulin glargine-yfgn  10 Units Subcutaneous QHS   midodrine  20 mg Per Tube TID WC   multivitamin  1 tablet Per Tube QHS   nutrition supplement (JUVEN)  1 packet Per  Tube BID BM   mouth rinse  15 mL Mouth Rinse 4 times per day   pantoprazole  40 mg Per Tube BID   QUEtiapine  50 mg Per Tube QHS   sodium chloride flush  3 mL Intravenous Q12H   thiamine  100 mg Per Tube Daily   zinc sulfate  220 mg Per Tube Daily    have reviewed scheduled and prn medications.  Physical Exam: General:  on TC this AM-   verbal today -  really feeling the frustration of being here so long Heart: RRR Lungs: mostly clear Abdomen: slightly distended Extremities: no edema Dialysis Access: right IJ dialysis cath-  46 days old     11/04/2021,10:46 AM  LOS: 37 days

## 2021-11-04 NOTE — Progress Notes (Signed)
NAME:  CHETT TANIGUCHI, MRN:  [id number], DOB:  Jun 28, 1960, LOS: 20 ADMISSION DATE:  09/28/2021, CONSULTATION DATE:  09/29/2021 REFERRING MD:  Kipp Brood - TCTS CHIEF COMPLAINT: Dyspnea   History of Present Illness:  61 year old man who underwent CABG on 8/3, readmitted on 8/19 in setting of dyspnea with a pericardial effusion requiring pericardial window via bedside sternotomy. Also with bilateral pleural effusions. Sustained PEA arrest on 8/20, briefly required [facility] ECMO which was discontinued on 8/28, Impella placed 8/28, started on CRRT.  Hypoxemia, repeat cardiac arrest on 8/30. Underwent mediastinal washout/sternotomy closure 9/2. Impella removed and tracheostomy completed 9/7.  Pertinent Medical History:  CAD s/p CABG 8/3 DM2 Diverticulosis GERD Hyperlipidemia Hypertension History of stroke Tobacco Abuse  Significant Hospital Events: Including procedures, antibiotic start and stop dates in addition to other pertinent events   8/19 Admitted, s/p pericardial window 8/20 PEA cardiac arrest, left pigtail chest tube placement, PEA cardiac arrest due to tamponade, bedside sternotomy with pericardial drains placed. 8/21 Return to OR for overnight bleeding.  8/22 Minimal chest tube output.  Atrial fibrillation, controled with amiodarone.  8/22 TEE showed EF 35% at baseline, which improved significantly with decreasing ECMO flow.  8/23 Tolerated diuresis  8/24 Mediastinal washout, removal of hematoma 8/25 Ongoing bleeding issues from posterior mediastinum 8/28 Decannulated and off ECMO, 5.5 Impella placed, L femoral HD cath placed and started on CRRT 8/30 Hypoxia and hypotension followed by brief arrest, gentle L lateral chest compressions performed and ROSC in roughly 1 minute. 8/31 Remains deeply sedated on ventilator with open chest.  No acute events overnight. 9/1 No acute events overnight, remains on ventilator deep sedation.  Tolerating aggressive volume removal per CRRT 9/2 Sternotomy  closure 9/3 Wean versed 9/4 FOB, low grade temps, notable thick mucus/frequent suctioning 9/5 Vomiting, ?mucus plugging. PSV 5-6 hours on 10/5 9/6 Impella down to p3 9/7 Impella removal and trach. PRBC and DDAVP in OR  9/8 Low dose epi, weaned on 8/8 9/9 RIJ HD catheter placed 9/11 Started on ATC at 28% and tolerating well 9/12 Remains on 28% ATC 9/13 SB/Junction rhythm with associated hypotension overnight requiring transient pressor escalation. Pressor requirement down in AM. WBC count 23 (18), Co-ox 36%. Resp/Blood Cx. 9/14 Mild bradycardia/relative hypotension with Precedex. Remains on Epi 57mcg, Vaso 0.03. Resp Cx with few gram+ cocci (clusters), yeast. BCx NGTD. 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 Co-ox 48%. CRRT stopped. 9/15 WBC continues to downtrend. Hgb 7.6. Net +1.49L/24H. CRRT resumed. Co-ox 51%. NE started to help with Epi wean. 9/16, Co. oximetry improved, WBC trending down, weaning pressors 9/19 on trach collar for 96 hours.  Weaning off vasopressors.  Trial off CRRT today.  Episode of overnight emesis. 9/20 more awake. CRRT off 9/21 cuffless tracheostomy. Do well with PMV 9/22 failed swallow evaluation 9/25 Tolerating trach capping on RA. Up to chair. Trach decannulation today. Tunneled dialysis catheter placement with IR.  Interim History / Subjective:  No significant events overnight Tolerating trach capping Maintaining sats on RA Up to chair Decannulate today, 9/25 Tunneled dialysis catheter placement with IR pending  Objective:  Blood pressure 115/62, pulse 85, temperature 98.7 F (37.1 C), temperature source Axillary, resp. rate (!) 22, height 5\' 7"  (1.702 m), weight 91.4 kg, SpO2 98 %.        Intake/Output Summary (Last 24 hours) at 11/04/2021 0833 Last data filed at 11/04/2021 0659 Gross per 24 hour  Intake 1364.72 ml  Output 430 ml  Net 934.72 ml  Filed Weights   11/02/21 1208 11/03/21 0500 11/04/21 0500   Weight: 84.6 kg 84.9 kg 91.4 kg   Physical Examination: General: Chronically ill-appearing middle-aged man in NAD. Up to chair at bedside. HEENT: [place]/AT, anicteric sclera, PERRL, dry mucous membranes. Tracheostomy present, capped. Neuro: Awake, oriented x 4. Mouthing words to questions appropriately. Responds to verbal stimuli. Following commands consistently. Moves all 4 extremities spontaneously.  CV: RRR, no m/g/r. PULM: Breathing even and unlabored on RA. Lung fields CTAB. Trach capped. GI: Soft, nontender, nondistended. Normoactive bowel sounds. Extremities: No significant LE edema noted. Skin: Warm/dry, no rashes.  Assessment & Plan:   Acute metabolic encephalopathy.   Agitated at times with poor impulse control. - Limit sedatives, weaning off as able - Continue Klonopin, oxycodone PRN - Seroquel QHS  Hemorrhagic pericarditis with tamponade PEA arrest due to tamponade, s/p emergent sternotomy Cardiogenic & hemorrhagic shock, s/p VA ECMO (decannulated 8/29), s/p Impella (removed 9/7); off all vasopressors. EF now normal. CAD s/p CABG x 3 Afib  - TCTS primary - Goal MAP > 60 - Levophed titrated to goal MAP - Continue midodrine 20mg  TID - Cardiac monitoring  Leukocytosis, afebrile  Completed course of daptomycin, completed and meropenem. LE/UE Dopplers negative for DVTs. - Continue micafungin x 14-day course (end 9/27) - Low threshold for panculture if patient becomes febrile - Plan for Permcath week of 9/25 as IR available  Acute respiratory failure with hypoxemia and hypercarbia S/p Tracheostomy  Downsized to cuffless trach 9/22. Capped 9/24. - Tolerated capping - Decannulate tracheostomy today, 9/25 - Pulmonary hygiene  AKI with anuria  Hypophosphatemia - Nephro following - Transitioned to iHD (last 9/23) - Pending TDC placement - Trend BMP - Replete electrolytes as indicated - Monitor I&Os  DM II with hyperglycemia - SSI - CBGs Q4H - Goal CBG  140-180  At Risk Malnutrition  - TF via Cortrak - SLP following, failed swallow 9/22 - Consider FEES post-decannulation  Sacral pressure wound - WOC following  Severe physical deconditioning - PT/OT/SLP as able - Evaluate for CIR candidacy once medically ready/off of pressors  Best Practice (right click and "Reselect all SmartList Selections" daily)  Diet/type: tubefeeds  DVT prophylaxis: prophylactic heparin  GI prophylaxis: PPI Lines: N/A Foley:  N/A Code Status:  full code Last date of multidisciplinary goals of care discussion patient updated daily.  Critical care time:   The patient is critically ill with multiple organ system failure and requires high complexity decision making for assessment and support, frequent evaluation and titration of therapies, advanced monitoring, review of radiographic studies and interpretation of complex data.   Critical Care Time devoted to patient care services, exclusive of separately billable procedures, described in this note is 32 minutes.  Lestine Mount, PA-C [facility] 11/04/21 12:51 PM  Please see [email or web address] for pager details.  From 7A-7P if no response, please call [phone] After hours, please call ELink [phone]

## 2021-11-05 DIAGNOSIS — R531 Weakness: Secondary | ICD-10-CM

## 2021-11-05 DIAGNOSIS — L89899 Pressure ulcer of other site, unspecified stage: Secondary | ICD-10-CM

## 2021-11-05 LAB — RENAL FUNCTION PANEL
Albumin: 2.5 g/dL — ABNORMAL LOW (ref 3.5–5.0)
Anion gap: 17 — ABNORMAL HIGH (ref 5–15)
BUN: 141 mg/dL — ABNORMAL HIGH (ref 8–23)
CO2: 20 mmol/L — ABNORMAL LOW (ref 22–32)
Calcium: 9.1 mg/dL (ref 8.9–10.3)
Chloride: 90 mmol/L — ABNORMAL LOW (ref 98–111)
Creatinine, Ser: 6.34 mg/dL — ABNORMAL HIGH (ref 0.61–1.24)
GFR, Estimated: 9 mL/min — ABNORMAL LOW (ref >60)
Glucose, Bld: 150 mg/dL — ABNORMAL HIGH (ref 70–99)
Phosphorus: 5.7 mg/dL — ABNORMAL HIGH (ref 2.5–4.6)
Potassium: 6.4 mmol/L (ref 3.5–5.1)
Sodium: 127 mmol/L — ABNORMAL LOW (ref 135–145)

## 2021-11-05 LAB — GLUCOSE, CAPILLARY
Glucose-Capillary: 141 mg/dL — ABNORMAL HIGH (ref 70–99)
Glucose-Capillary: 164 mg/dL — ABNORMAL HIGH (ref 70–99)
Glucose-Capillary: 164 mg/dL — ABNORMAL HIGH (ref 70–99)
Glucose-Capillary: 170 mg/dL — ABNORMAL HIGH (ref 70–99)
Glucose-Capillary: 171 mg/dL — ABNORMAL HIGH (ref 70–99)
Glucose-Capillary: 203 mg/dL — ABNORMAL HIGH (ref 70–99)

## 2021-11-05 LAB — PREPARE RBC (CROSSMATCH)

## 2021-11-05 LAB — MAGNESIUM: Magnesium: 2.3 mg/dL (ref 1.7–2.4)

## 2021-11-05 MED ORDER — HEPARIN SODIUM (PORCINE) 1000 UNIT/ML DIALYSIS
20.0000 [IU]/kg | INTRAMUSCULAR | Status: DC | PRN
  Filled 2021-11-05 (×2): qty 2

## 2021-11-05 MED ORDER — SODIUM CHLORIDE 0.9% IV SOLUTION: Freq: Once | INTRAVENOUS | Status: DC

## 2021-11-05 MED ORDER — MELATONIN 5 MG PO TABS
5.0000 mg | ORAL_TABLET | Freq: Once | ORAL | Status: AC
  Administered 2021-11-05: 5 mg via ORAL
  Filled 2021-11-05: qty 1

## 2021-11-05 MED ORDER — ALBUMIN HUMAN 25 % IV SOLN
25.0000 g | Freq: Once | INTRAVENOUS | Status: AC
  Administered 2021-11-07: 25 g via INTRAVENOUS
  Filled 2021-11-05: qty 100

## 2021-11-05 MED ORDER — SODIUM ZIRCONIUM CYCLOSILICATE 10 G PO PACK
10.0000 g | PACK | Freq: Two times a day (BID) | ORAL | Status: DC
  Administered 2021-11-05 (×2): 10 g via ORAL
  Filled 2021-11-05 (×2): qty 1

## 2021-11-05 NOTE — Progress Notes (Signed)
Speech Language Pathology Treatment: Dysphagia  Patient Details Name: [name] MRN: [id number] DOB: 1960/03/23 Today's Date: 11/05/2021 Time: 4098-1191 SLP Time Calculation (min) (ACUTE ONLY): 14 min  Assessment / Plan / Recommendation Clinical Impression  Pt was decannulated on previous date with voice mildly stronger today. He says that he thinks he sounds more like himself with his trach out. PMV no longer needed so tx focused solely on dysphagia. Ice chips were offered with less audible secretions noted at baseline and voice stable throughout POs. SLP provided cues for effortful swallows, and he does swallow both spontaneously with ice chips and to command. Also attempted to initiate Masako and Mendelsohn maneuvers but cognitively (and likely physically) these are a bit challenging for him. From a swallowing standpoint he may be appropriate for EMST if medically appropriate. Will continue to follow - he might benefit from repeat swallow study later this week if he is also able to perform some exercises and have some ice chips after oral care with staff.    HPI [name], bradycardia and hypotension. Pt with bil pleural effusion and pericardial effusion s/p VATS with pericardial window 8/19. 8/20 cardiac tamponade with arrest,  intubated with bedside mediastinal exploration and ECMO via fem access. OR 8/21 for reexploration due to mediastinal hemorrhage. 8/28 OR for washout with ECMO decannulation and Impella placed. 8/29 brief PEA arrest with DCCV to NSR. 9/2 chest closure. 9/7 trach and Impella removed.9/11 VAC removed. PMHx: CAD s/p recent CABG x5 (admitted  8/3-11/23 with post op Afib), CKD stage II, DM, HTN, AKI, Afib and CVA      SLP Plan  Continue with current plan of care      Recommendations for follow up therapy are one component of a multi-disciplinary discharge planning process, led by the attending physician.  Recommendations may be updated based on  patient status, additional functional criteria and insurance authorization.    Recommendations  Diet recommendations: NPO Medication Administration: Via alternative means (few ice chips at a time after oral care)                Oral Care Recommendations: Oral care QID Follow Up Recommendations: Skilled nursing-short term rehab (<3 hours/day) Assistance recommended at discharge: Frequent or constant Supervision/Assistance SLP Visit Diagnosis: Dysphagia, pharyngeal phase (R13.13) Plan: Continue with current plan of care           Osie Bond., M.A. [place] Office [phone]  Secure chat preferred   11/05/2021, 10:50 AM

## 2021-11-05 NOTE — Progress Notes (Signed)
Occupational Therapy Treatment Patient Details Name: [name] MRN: [id number] DOB: 07/23/1960 Today's Date: 11/05/2021   History of present illness Pt is a 61 y.o. male admitted 09/28/21 with SOB, bradycardia, hypotension. Workup for bilateral pleural effusion, pericardial effusion s/p VATS with pericardial window 8/19. Cardiac arrest with tamponade 8/20, intubated with bedside mediastinal exploration, ECMO via fem access. Pt with mediastinal hemorrhage s/p reexploration 8/21. To OR 8/28 for washout, ECMO decannulation, Impella placement. CRRT initiated 8/28. Brief PEA arrest 8/29 with DCCV to NSR.  S/p chest closure 9/2. S/p trach and impella removed 9/7. S/p wound vac removal 9/11. Course complicated by bouts of hypotension and bradycardia. Off CRRT 9/19. Trach plugged 11/03/21. Plan for tunneled HD cath 11/04/21. PMH includes CAD (s/p recent CABG 09/12/21), CKD2, DM, HTN, AKI, afib, CVA.   OT comments  Pt seen briefly this am due to being on dialysis and nsg requesting not to move pt. Focus of session to further assess RUE. Pt with minimal digit movement, has difficulty with pronation and is unable to use R hand functionally, which pt states is new. MD made aware. Pt will need extensive rehab at [facility] after DC. Acute OT to continue to follow    Recommendations for follow up therapy are one component of a multi-disciplinary discharge planning process, led by the attending physician.  Recommendations may be updated based on patient status, additional functional criteria and insurance authorization.    Follow Up Recommendations  Skilled nursing-short term rehab (<3 hours/day)    Assistance Recommended at Discharge Frequent or constant Supervision/Assistance  Patient can return home with the following  Two people to help with walking and/or transfers;Two people to help with bathing/dressing/bathroom;Direct supervision/assist for medications management;Direct supervision/assist for financial  management;Assistance with feeding;Assistance with cooking/housework;Assist for transportation;Help with stairs or ramp for entrance   Equipment Recommendations  Other (comment) (TBA at SNF)    Recommendations for Other Services      Precautions / Restrictions Precautions Precautions: Sternal;Fall;Other (comment) Precaution Comments: cortrak, flexiseal; R side Restrictions Other Position/Activity Restrictions: initial CABG 09/12/21       Mobility Bed Mobility                    Transfers                         Balance                                           ADL either performed or assessed with clinical judgement   ADL   Eating/Feeding: NPO   Grooming: Maximal assistance (ableto wash face)                                 General ADL Comments: total to Max A for ADL tasks at this time with exception of grooming    Extremity/Trunk Assessment Upper Extremity Assessment Upper Extremity Assessment: RUE deficits/detail RUE Deficits / Details: shoulder not fully assessed due to ongoing CVVHD; unable to pronate; unable to make a funcitonla grasp -@ 10-15 degrees IP flexion; unable to oppose thumb to digits; unable to abduct digits RUE Coordination: decreased fine motor;decreased gross motor LUE Deficits / Details: generalized weakness; weaker proximally; able to achieve @ 90 degree FF and ABd; limited ER ; unabl eot reach back  of head   Lower Extremity Assessment Lower Extremity Assessment: Defer to PT evaluation        Vision       Perception     Praxis      Cognition Arousal/Alertness: Awake/alert Behavior During Therapy: Flat affect Overall Cognitive Status: Impaired/Different from baseline Area of Impairment: Attention, Memory, Following commands, Safety/judgement, Awareness, Problem solving                 Orientation Level: Disoriented to, Time Current Attention Level: Sustained Memory: Decreased  recall of precautions Following Commands: Follows one step commands with increased time, Follows one step commands consistently Safety/Judgement: Decreased awareness of deficits Awareness: Emergent, Intellectual Problem Solving: Slow processing, Difficulty sequencing          Exercises Exercises: Hand exercises Hand Exercises Forearm Supination: AROM, Right, 5 reps Forearm Pronation: PROM, AAROM, Strengthening, 5 reps Wrist Flexion: AAROM, PROM, Right, 5 reps Wrist Extension: Right, AROM, 5 reps Wrist Ulnar Deviation: PROM, Right, 5 reps Wrist Radial Deviation: PROM, Strengthening, 5 reps Digit Composite Flexion: PROM, Right, 10 reps Composite Extension: PROM, AAROM, Right, 10 reps Opposition: PROM, Right, 5 reps    Shoulder Instructions       General Comments      Pertinent Vitals/ Pain       Pain Assessment Pain Assessment: Faces Faces Pain Scale: Hurts little more Pain Location: generalized Pain Descriptors / Indicators: Discomfort, Sore Pain Intervention(s): Limited activity within patient's tolerance  Home Living                                          Prior Functioning/Environment              Frequency  Min 2X/week        Progress Toward Goals  OT Goals(current goals can now be found in the care plan section)  Progress towards OT goals: Not progressing toward goals - comment (due to immobility for HD; goals updated)  Acute Rehab OT Goals Patient Stated Goal: to get stronger OT Goal Formulation: With patient Time For Goal Achievement: 11/18/21 Potential to Achieve Goals: Good ADL Goals Pt Will Perform Grooming: with mod assist;bed level Pt Will Perform Upper Body Bathing: with mod assist;bed level Pt/caregiver will Perform Home Exercise Program: Increased strength;Increased ROM;Both right and left upper extremity;With minimal assist Additional ADL Goal #1: Pt will demosntrate sustained attention for grooming task in  nondistracting environment Additional ADL Goal #2: Pt will complete bed mobility with mod A in preparation for ADL tasks Additional ADL Goal #3: Pt will grasp/release objects using functional pinch pattern with necessary AE/ splinting to increase functional use R hand  Plan Discharge plan remains appropriate;Frequency remains appropriate    Co-evaluation                 AM-PAC OT "6 Clicks" Daily Activity     Outcome Measure   Help from another person eating meals?: Total Help from another person taking care of personal grooming?: A Lot Help from another person toileting, which includes using toliet, bedpan, or urinal?: Total Help from another person bathing (including washing, rinsing, drying)?: Total Help from another person to put on and taking off regular upper body clothing?: Total Help from another person to put on and taking off regular lower body clothing?: Total 6 Click Score: 7    End of Session    OT Visit  Diagnosis: Unsteadiness on feet (R26.81);Other abnormalities of gait and mobility (R26.89);Muscle weakness (generalized) (M62.81)   Activity Tolerance Other (comment) (limited as HD ongoing and nsg asking not to move pt position)   Patient Left in bed;with call bell/phone within reach;with nursing/sitter in room   Nurse Communication Mobility status;Other (comment) (concern over R hand)        Time: 1210-1225 OT Time Calculation (min): 15 min  Charges: OT General Charges $OT Visit: 1 Visit OT Treatments $Therapeutic Activity: 8-22 mins  Maurie Boettcher, OT/L   Acute OT Clinical Specialist [place] Pager [phone] Office [phone]   [facility] 11/05/2021, 3:39 PM

## 2021-11-05 NOTE — Consult Note (Signed)
[place] Nurse wound follow up Patient receiving care in [place] Primary RN present and assisted with turning patient.  Wound type: Unstageable wound to the bilateral buttocks and coccyx. Measurement: 10.3 cm x tsa of 9.5 spanning over the entire buttock area. Butterfly shape with left and right width of 4 cm Wound bed: 70% black/brown and 30% pink/tan  Drainage (amount, consistency, odor) brown/tan on dressing Periwound: intact Dressing procedure/placement/frequency: Continue current use of Santyl topped with Xeroform gauze and secured with foam dressing. Change daily.   WOC will continue to follow weekly  Jocelyn Lamer L. Tamala Julian, MSN, RN, [place], [place], [facility] Wound Treatment Associate Pager [phone]

## 2021-11-05 NOTE — Progress Notes (Signed)
Received patient in bed to unit.  Alert and oriented.  Informed consent signed and in chart.   Treatment initiated: 1213 Treatment completed: 1752  Patient tolerated well.  Transported back to the room  Alert, without acute distress.  Hand-off given to patient's nurse.   Access used: HD cath Access issues: NA  Total UF removed: 2010 ml Medication(s) given: heparin 1900 units bolus, heparin dwells 3800 units Post HD VS: 98.6    129/81    88    31    99% RA Post HD weight: 89.8kg   [facility]

## 2021-11-05 NOTE — Progress Notes (Signed)
EVENING ROUNDS NOTE :     [place] [address]       [place] [address]             [phone]                 19 Days Post-Op Procedure(s) (LRB): REMOVAL OF IMPELLA LEFT VENTRICULAR ASSIST DEVICE (Right) TRANSESOPHAGEAL ECHOCARDIOGRAM (TEE) (N/A) PERCUTANEOUS TRACHEOSTOMY USING SHILEY FLEXIBLE 8 mm CUFFED TRACH. (N/A)   Total Length of Stay:  LOS: 38 days  Events:   No events decannulated    BP 129/81 (BP Location: Right Arm)   Pulse 93   Temp 98.6 F (37 C) (Oral)   Resp (!) 31   Ht 5\' 7"  (1.702 m)   Wt 89.8 kg   SpO2 99%   BMI 31.01 kg/m          sodium chloride 10 mL/hr at 11/05/21 1800   sodium chloride Stopped (11/04/21 1021)   albumin human     albumin human 12.5 g (11/01/21 2248)   anticoagulant sodium citrate     ferric gluconate (FERRLECIT) IVPB Stopped (11/05/21 1023)   micafungin (MYCAMINE) 100 mg in sodium chloride 0.9 % 100 mL IVPB 100 mg (11/05/21 1841)   norepinephrine (LEVOPHED) Adult infusion 4 mcg/min (11/05/21 1800)    I/O last 3 completed shifts: In: 3100.4 [I.V.:708.2; Blood:640; NG/GT:1326; IV Piggyback:426.1] Out: 1740 [Other:1710; Stool:30]      Latest Ref Rng & Units 11/05/2021   12:09 PM 11/04/2021    4:21 AM 11/03/2021    6:41 AM  CBC  WBC 4.0 - 10.5 K/uL 18.4  25.7  24.2   Hemoglobin 13.0 - 17.0 g/dL 6.1  7.5  7.0   Hematocrit 39.0 - 52.0 % 20.5  24.9  23.7   Platelets 150 - 400 K/uL 433  432  437        Latest Ref Rng & Units 11/05/2021    4:09 AM 11/04/2021    4:21 AM 11/03/2021    6:41 AM  BMP  Glucose 70 - 99 mg/dL 150  133  175   BUN 8 - 23 mg/dL 141  122  77   Creatinine 0.61 - 1.24 mg/dL 6.34  5.11  3.79   Sodium 135 - 145 mmol/L 127  132  134   Potassium 3.5 - 5.1 mmol/L 6.4  4.9  4.1   Chloride 98 - 111 mmol/L 90  90  93   CO2 22 - 32 mmol/L 20  23  25    Calcium 8.9 - 10.3 mg/dL 9.1  9.3  9.1     ABG    Component Value Date/Time   PHART 7.301 (L) 10/22/2021 2354   PCO2ART 38.5 10/22/2021 2354    PO2ART 73 (L) 10/22/2021 2354   HCO3 19.3 (L) 10/22/2021 2354   TCO2 20 (L) 10/22/2021 2354   ACIDBASEDEF 7.0 (H) 10/22/2021 2354   O2SAT 71.7 11/02/2021 0439       Melodie Bouillon, MD 11/05/2021 7:01 PM

## 2021-11-05 NOTE — TOC Progression Note (Signed)
Transition of Care [facility]) - Progression Note    Patient Details  Name: [name] MRN: [id number] Date of Birth: 01/08/1961  Transition of Care [facility]) CM/SW [place], [place] Phone Number: 11/05/2021, 4:09 PM  Clinical Narrative:     CSW followed up with patients spouse on dc plan for patient. Patients confirmed she is agreeable to rehab for patient when patient is medically ready for dc. Patients spouse reports after rehab patients plan would be to return back home with his uncle. Patients spouse interested in SNF or CIR. Patients uncle unable to provide safe supervision at home if any assistance is needed. Patients Uncle unable to be caregiver for patient in the home. Barriers to placement for SNF currently no insurance. Patients spouse will follow up with CSW if she finds out any additional information in regards to patients insurance. Patients spouse possibly thinking he may have insurance/trying to verify with employer. CSW will continue to follow.   Expected Discharge Plan: IP Rehab Facility Barriers to Discharge: Continued Medical Work up  Expected Discharge Plan and Services Expected Discharge Plan: [place]                                               Social Determinants of Health (SDOH) Interventions    Readmission Risk Interventions    09/20/2021   11:57 AM  Readmission Risk Prevention Plan  Post Dischage Appt Complete  Medication Screening Complete  Transportation Screening Complete

## 2021-11-05 NOTE — Progress Notes (Signed)
Subjective:   [name], [name], cath flow issues; [name], [name]  Objective Vital signs in last 24 hours: Vitals:   11/05/21 0645 11/05/21 0700 11/05/21 0747 11/05/21 0800  BP: (!) 98/56 (!) 98/51  (!) 105/53  Pulse: 66 67 70 66  Resp: (!) 21 (!) 24  20  Temp:      TempSrc:      SpO2: 97% 97%  100%  Weight:      Height:       Weight change: -4.1 kg  Intake/Output Summary (Last 24 hours) at 11/05/2021 0836 Last data filed at 11/05/2021 0800 Gross per 24 hour  Intake 1429.44 ml  Output 30 ml  Net 1399.44 ml    Assessment/Plan:     61 y.o. yo male  with HTN, HLD, DM, A-fib, stroke, CKD, CAD status post CABG on 8/3 presented with SOB, found cardiac tamponade, course complicated by cardiac arrest, use of ECMO, seen as a consultation for AKI    #Dialysis dependent Acute kidney injury on CKD IIIa.   Ischemic ATN due to cardiogenic shock/cardiac arrest.   Started CRRT on 8/28 for decreasing urine output and fluid/volume management.  -  stopped CRRT on 9/19-   tolerated IHD on 9/21 and 9/23 S/p L IJ TDC with IR 9/25 HD today. 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  decannulated  with placement of Impella.  S/p closure of the sternum and mediastinal washout on 9/2.   #Cardiogenic shock/cardiac arrest: Was on ECMO.  Pressors and midodrine per primary/AHF team.  Impella out on 9/7- remarkable improvement -     #Acute hypoxic respiratory failure:  improving  #Acute blood loss anemia: Monitor hemoglobin and transfuse as needed by ICU team.  Aranesp 100 weekly. 9/25 TSAT 10% and Ferritin 215, start IV Fe  Dorsie Burich Modesta Messing    Labs: Basic Metabolic Panel: Recent Labs  Lab 11/03/21 0641 11/04/21 0421 11/05/21 0409  NA 134* 132* 127*  K 4.1 4.9 6.4*  CL  93* 90* 90*  CO2 25 23 20*  GLUCOSE 175* 133* 150*  BUN 77* 122* 141*  CREATININE 3.79* 5.11* 6.34*  CALCIUM 9.1 9.3 9.1  PHOS 2.7 3.0 5.7*    Liver Function Tests: Recent Labs  Lab 11/03/21 0641 11/04/21 0421 11/05/21 0409  ALBUMIN 2.7* 2.7* 2.5*    No results for input(s): "LIPASE", "AMYLASE" in the last 168 hours. No results for input(s): "AMMONIA" in the last 168 hours. CBC: Recent Labs  Lab 10/31/21 0506 11/01/21 0344 11/02/21 0439 11/03/21 0641 11/04/21 0421  WBC 25.5* 24.1* 24.7* 24.2* 25.7*  HGB 8.0* 7.5* 7.3* 7.0* 7.5*  HCT 27.4* 24.9* 24.0* 23.7* 24.9*  MCV 100.0 98.4 99.2 100.9* 98.0  PLT 390 426* 445* 437* 432*    Cardiac Enzymes: No results for input(s): "CKTOTAL", "CKMB", "CKMBINDEX", "TROPONINI" in the last 168 hours.  CBG: Recent Labs  Lab 11/04/21 1250 11/04/21 1526 11/04/21 2008 11/04/21 2307 11/05/21 0341  GLUCAP 139* 132* 104* 165* 141*     Iron Studies:  Recent Labs    11/04/21 0421  IRON 39*  TIBC 402  FERRITIN 215    Studies/Results: IR Fluoro Guide CV Line Left  Result Date: 11/05/2021 INDICATION: 61 year old with acute kidney injury and needs a tunneled dialysis catheter. EXAM: FLUOROSCOPIC  AND ULTRASOUND GUIDED PLACEMENT OF A TUNNELED DIALYSIS CATHETER Physician: Stephan Minister. Anselm Pancoast, MD MEDICATIONS: Ancef 2 g, Dilaudid 1 mg ANESTHESIA/SEDATION: The patient was continuously monitored during the procedure by the interventional radiology nurse under my direct supervision. FLUOROSCOPY TIME:  Radiation Exposure Index (as provided by the fluoroscopic device): 3.1 mGy Kerma COMPLICATIONS: None immediate. PROCEDURE: Informed consent was obtained for placement of a tunneled dialysis catheter. The patient was placed supine on the interventional table. Ultrasound confirmed a patent left internal jugular vein. Ultrasound image obtained for documentation. The left neck and chest was prepped and draped in a sterile fashion. Maximal barrier sterile  technique was utilized including caps, mask, sterile gowns, sterile gloves, sterile drape, hand hygiene and skin antiseptic. The left neck was anesthetized with 1% lidocaine. A small incision was made with #11 blade scalpel. A 21 gauge needle directed into the left internal jugular vein with ultrasound guidance. A micropuncture dilator set was placed. A 23 cm tip to cuff Palindrome catheter was selected. The skin below the left clavicle was anesthetized and a small incision was made with an #11 blade scalpel. A subcutaneous tunnel was formed to the vein dermatotomy site. The catheter was brought through the tunnel. The vein dermatotomy site was dilated to accommodate a peel-away sheath. The catheter was placed through the peel-away sheath and directed into the central venous structures. The tip of the catheter was placed at superior cavoatrial junction with fluoroscopy. Fluoroscopic images were obtained for documentation. Both lumens were found to aspirate and flush well. The proper amount of heparin was flushed in both lumens. The vein dermatotomy site was closed using a single layer of absorbable suture and Dermabond. The catheter was secured to the skin using Prolene suture. IMPRESSION: Successful placement of a left jugular tunneled dialysis catheter using ultrasound and fluoroscopic guidance. Electronically Signed   By: Markus Daft M.D.   On: 11/05/2021 08:23   IR US Guide Vasc Access Left  Result Date: 11/05/2021 INDICATION: 61 year old with acute kidney injury and needs a tunneled dialysis catheter. EXAM: FLUOROSCOPIC AND ULTRASOUND GUIDED PLACEMENT OF A TUNNELED DIALYSIS CATHETER Physician: Stephan Minister. Anselm Pancoast, MD MEDICATIONS: Ancef 2 g, Dilaudid 1 mg ANESTHESIA/SEDATION: The patient was continuously monitored during the procedure by the interventional radiology nurse under my direct supervision. FLUOROSCOPY TIME:  Radiation Exposure Index (as provided by the fluoroscopic device): 3.1 mGy Kerma COMPLICATIONS:  None immediate. PROCEDURE: Informed consent was obtained for placement of a tunneled dialysis catheter. The patient was placed supine on the interventional table. Ultrasound confirmed a patent left internal jugular vein. Ultrasound image obtained for documentation. The left neck and chest was prepped and draped in a sterile fashion. Maximal barrier sterile technique was utilized including caps, mask, sterile gowns, sterile gloves, sterile drape, hand hygiene and skin antiseptic. The left neck was anesthetized with 1% lidocaine. A small incision was made with #11 blade scalpel. A 21 gauge needle directed into the left internal jugular vein with ultrasound guidance. A micropuncture dilator set was placed. A 23 cm tip to cuff Palindrome catheter was selected. The skin below the left clavicle was anesthetized and a small incision was made with an #11 blade scalpel. A subcutaneous tunnel was formed to the vein dermatotomy site. The catheter was brought through the tunnel. The vein dermatotomy site was dilated to accommodate a peel-away sheath. The catheter was placed through the peel-away sheath and directed into the central venous structures. The tip of the catheter was placed at superior cavoatrial junction with fluoroscopy. Fluoroscopic  images were obtained for documentation. Both lumens were found to aspirate and flush well. The proper amount of heparin was flushed in both lumens. The vein dermatotomy site was closed using a single layer of absorbable suture and Dermabond. The catheter was secured to the skin using Prolene suture. IMPRESSION: Successful placement of a left jugular tunneled dialysis catheter using ultrasound and fluoroscopic guidance. Electronically Signed   By: Markus Daft M.D.   On: 11/05/2021 08:23   VAS [place] UPPER EXTREMITY VENOUS DUPLEX  Result Date: 11/04/2021 UPPER VENOUS STUDY  Patient Name:  [name] [facility]  Date of Exam:   11/03/2021 Medical Rec #: [id number]     Accession #:    [id number] Date  of Birth: October 23, 1960     Patient Gender: M Patient Age:   61 years Exam Location:  [facility] Procedure:      VAS [place] UPPER EXTREMITY VENOUS DUPLEX Referring Phys: Hayden Pedro --------------------------------------------------------------------------------  Indications: Edema Limitations: Trach collar, line and bandages. Comparison Study: No prior study Performing Technologist: Sharion Dove RVS  Examination Guidelines: A complete evaluation includes B-mode imaging, spectral Doppler, color Doppler, and power Doppler as needed of all accessible portions of each vessel. Bilateral testing is considered an integral part of a complete examination. Limited examinations for reoccurring indications may be performed as noted.  Right Findings: +----------+------------+---------+-----------+----------+--------------+ RIGHT     CompressiblePhasicitySpontaneousProperties   Summary     +----------+------------+---------+-----------+----------+--------------+ IJV                                                 Not visualized +----------+------------+---------+-----------+----------+--------------+ Subclavian               Yes       Yes                             +----------+------------+---------+-----------+----------+--------------+ Axillary                 Yes       Yes                             +----------+------------+---------+-----------+----------+--------------+ Brachial      Full                                                 +----------+------------+---------+-----------+----------+--------------+ Radial        Full                                                 +----------+------------+---------+-----------+----------+--------------+ Ulnar         Full                                                 +----------+------------+---------+-----------+----------+--------------+ Cephalic      Full                                                  +----------+------------+---------+-----------+----------+--------------+  Basilic       Full                                                 +----------+------------+---------+-----------+----------+--------------+  Left Findings: +----------+------------+---------+-----------+----------+--------------+ LEFT      CompressiblePhasicitySpontaneousProperties   Summary     +----------+------------+---------+-----------+----------+--------------+ IJV                                                 Not visualized +----------+------------+---------+-----------+----------+--------------+ Subclavian               Yes       Yes                             +----------+------------+---------+-----------+----------+--------------+ Axillary                 Yes       Yes                             +----------+------------+---------+-----------+----------+--------------+ Brachial      Full                                                 +----------+------------+---------+-----------+----------+--------------+ Radial        Full                                                 +----------+------------+---------+-----------+----------+--------------+ Ulnar                                               Not visualized +----------+------------+---------+-----------+----------+--------------+ Cephalic      Full                                                 +----------+------------+---------+-----------+----------+--------------+ Basilic                                             Not visualized +----------+------------+---------+-----------+----------+--------------+  Summary:  Right: No evidence of deep vein thrombosis in the visualized veins of the upper extremity. No evidence of superficial vein thrombosis in the upper extremity.  Left: No evidence of deep vein thrombosis in the visualized veins of the upper extremity. No evidence of superficial vein thrombosis in  the upper extremity.  *See table(s) above for measurements and observations.  Diagnosing physician: Jamelle Haring Electronically signed by Jamelle Haring on 11/04/2021 at 11:14:46 AM.    Final    VAS [place] LOWER EXTREMITY VENOUS (DVT)  Result Date: 11/04/2021  Lower Venous  DVT Study Patient Name:  GREGG WINCHELL [facility]  Date of Exam:   11/03/2021 Medical Rec #: [id number]     Accession #:    [id number] Date of Birth: February 20, 1960     Patient Gender: M Patient Age:   73 years Exam Location:  [facility] Procedure:      VAS [place] LOWER EXTREMITY VENOUS (DVT) Referring Phys: Hayden Pedro --------------------------------------------------------------------------------  Indications: Edema.  Limitations: Patient not cooperative. Comparison Study: No prior study Performing Technologist: Sharion Dove RVS  Examination Guidelines: A complete evaluation includes B-mode imaging, spectral Doppler, color Doppler, and power Doppler as needed of all accessible portions of each vessel. Bilateral testing is considered an integral part of a complete examination. Limited examinations for reoccurring indications may be performed as noted. The reflux portion of the exam is performed with the patient in reverse Trendelenburg.  +--------+---------------+---------+-----------+----------------+-------------+ RIGHT   CompressibilityPhasicitySpontaneityProperties      Thrombus                                                                 Aging         +--------+---------------+---------+-----------+----------------+-------------+ CFV     Full                               pulsatile                                                                waveforms                     +--------+---------------+---------+-----------+----------------+-------------+ SFJ     Full                                                             +--------+---------------+---------+-----------+----------------+-------------+ FV Prox  Full                                                             +--------+---------------+---------+-----------+----------------+-------------+ FV Mid  Full                                                             +--------+---------------+---------+-----------+----------------+-------------+ FV      Full  Distal                                                                   +--------+---------------+---------+-----------+----------------+-------------+ PFV     Full                                                             +--------+---------------+---------+-----------+----------------+-------------+ POP     Full                               pulsatile                                                                waveforms                     +--------+---------------+---------+-----------+----------------+-------------+   +---------+---------------+---------+-----------+---------------+--------------+ LEFT     CompressibilityPhasicitySpontaneityProperties     Thrombus Aging +---------+---------------+---------+-----------+---------------+--------------+ CFV                                         pulsatile      patent by                                                  waveforms      color and                                                                 Doppler        +---------+---------------+---------+-----------+---------------+--------------+ FV Prox  Full                                                             +---------+---------------+---------+-----------+---------------+--------------+ FV Mid   Full                                                             +---------+---------------+---------+-----------+---------------+--------------+ FV DistalFull                                                              +---------+---------------+---------+-----------+---------------+--------------+  PFV      Full                                                             +---------+---------------+---------+-----------+---------------+--------------+ POP      Full                               pulsatile                                                                 waveforms                     +---------+---------------+---------+-----------+---------------+--------------+ PTV      Full                                                             +---------+---------------+---------+-----------+---------------+--------------+ PERO     Full                                                             +---------+---------------+---------+-----------+---------------+--------------+     Summary: BILATERAL: - No evidence of deep vein thrombosis seen in the lower extremities, bilaterally. -No evidence of popliteal cyst, bilaterally.   *See table(s) above for measurements and observations. Electronically signed by Jamelle Haring on 11/04/2021 at 11:14:35 AM.    Final    Medications: Infusions:  sodium chloride 10 mL/hr at 11/05/21 0800   sodium chloride Stopped (11/04/21 1021)   albumin human 12.5 g (11/01/21 2248)   anticoagulant sodium citrate      ceFAZolin (ANCEF) IV     ferric gluconate (FERRLECIT) IVPB Stopped (11/04/21 2242)   micafungin (MYCAMINE) 100 mg in sodium chloride 0.9 % 100 mL IVPB Stopped (11/04/21 1123)   norepinephrine (LEVOPHED) Adult infusion Stopped (11/05/21 0501)    Scheduled Medications:  ascorbic acid  250 mg Per Tube BID   aspirin  81 mg Per Tube Daily   Chlorhexidine Gluconate Cloth  6 each Topical Daily   collagenase   Topical Daily   darbepoetin (ARANESP) injection - NON-DIALYSIS  100 mcg Subcutaneous Q Mon-1800   feeding supplement (PROSource TF20)  60 mL Per Tube TID   feeding supplement (VITAL 1.5 CAL)  1,000 mL Per Tube Q24H   fiber  1 packet Per  Tube BID   folic acid  1 mg Per Tube Daily   Gerhardt's butt cream   Topical Daily   heparin injection (subcutaneous)  5,000 Units Subcutaneous Q8H   insulin aspart  0-20 Units Subcutaneous Q4H   insulin aspart  2 Units Subcutaneous Q4H   insulin glargine-yfgn  10 Units Subcutaneous QHS   midodrine  20 mg Per Tube TID WC   multivitamin  1 tablet Per Tube QHS   nutrition supplement (JUVEN)  1 packet Per Tube BID BM   mouth rinse  15 mL Mouth Rinse 4 times per day   pantoprazole  40 mg Per Tube BID   QUEtiapine  50 mg Per Tube QHS   sodium chloride flush  3 mL Intravenous Q12H   thiamine  100 mg Per Tube Daily   zinc sulfate  220 mg Per Tube Daily    have reviewed scheduled and prn medications.  Physical Exam: General:  resting, nad Heart: RRR Lungs: mostly clear Abdomen: slightly distended Extremities: no edema Dialysis Access: L IJ TDC bandaged   11/05/2021,8:36 AM  LOS: 38 days

## 2021-11-05 NOTE — Progress Notes (Signed)
19 Days Post-Op Procedure(s) (LRB): REMOVAL OF IMPELLA LEFT VENTRICULAR ASSIST DEVICE (Right) TRANSESOPHAGEAL ECHOCARDIOGRAM (TEE) (N/A) PERCUTANEOUS TRACHEOSTOMY USING SHILEY FLEXIBLE 8 mm CUFFED TRACH. (N/A) Subjective: No complaints Waiting for HD session Breathing well after trach decannulation yesterday TF at goal; 3 person assist to get to chair yesterday Objective: Vital signs in last 24 hours: Temp:  [97.6 F (36.4 C)-98.3 F (36.8 C)] 98.2 F (36.8 C) (09/26 0800) Pulse Rate:  [58-86] 66 (09/26 0800) Cardiac Rhythm: Normal sinus rhythm (09/26 0800) Resp:  [13-33] 20 (09/26 0800) BP: (87-150)/(39-82) 105/53 (09/26 0800) SpO2:  [88 %-100 %] 100 % (09/26 0800) Weight:  [87.3 kg] 87.3 kg (09/26 0457)  Hemodynamic parameters for last 24 hours:  nsr  Intake/Output from previous day: 09/25 0701 - 09/26 0700 In: 1392 [I.V.:470.6; NG/GT:606; IV Piggyback:315.4] Out: -  Intake/Output this shift: Total I/O In: 130 [I.V.:10; NG/GT:120] Out: 30 [Stool:30]  EXAM Alert, oriented Lower sternal wound clean granulation tissue packed daily Abd soft Lungs clear Lab Results: Recent Labs    11/03/21 0641 11/04/21 0421  WBC 24.2* 25.7*  HGB 7.0* 7.5*  HCT 23.7* 24.9*  PLT 437* 432*   BMET:  Recent Labs    11/04/21 0421 11/05/21 0409  NA 132* 127*  K 4.9 6.4*  CL 90* 90*  CO2 23 20*  GLUCOSE 133* 150*  BUN 122* 141*  CREATININE 5.11* 6.34*  CALCIUM 9.3 9.1    PT/INR: No results for input(s): "LABPROT", "INR" in the last 72 hours. ABG    Component Value Date/Time   PHART 7.301 (L) 10/22/2021 2354   HCO3 19.3 (L) 10/22/2021 2354   TCO2 20 (L) 10/22/2021 2354   ACIDBASEDEF 7.0 (H) 10/22/2021 2354   O2SAT 71.7 11/02/2021 0439   CBG (last 3)  Recent Labs    11/04/21 2307 11/05/21 0341 11/05/21 0840  GLUCAP 165* 141* 164*    Assessment/Plan: S/P Procedure(s) (LRB): REMOVAL OF IMPELLA LEFT VENTRICULAR ASSIST DEVICE (Right) TRANSESOPHAGEAL ECHOCARDIOGRAM  (TEE) (N/A) PERCUTANEOUS TRACHEOSTOMY USING SHILEY FLEXIBLE 8 mm CUFFED TRACH. (N/A) Recheck swallow study later in week- trach just pulled CIR placement is the goal but barriers are present  LOS: 38 days    Dahlia Byes 11/05/2021

## 2021-11-05 NOTE — Progress Notes (Signed)
Inpatient Rehabilitation Admissions Coordinator   Asked by [facility] SW to assess patient for possible CIR admit. Uncle can only provide supervision, not any physical assist and patient and his wife are separated. Patient not at a level to tolerate the intensity required of a CIR admit, will need prolonged rehab before returning home at an independent level, and will need to progress to sitting for outpatient dialysis. I concur with therapy recommendations for SNF.  Danne Baxter, RN, MSN Rehab Admissions Coordinator [phone] 11/05/2021 6:23 PM

## 2021-11-05 NOTE — Progress Notes (Signed)
NAME:  [name], MRN:  [id number], DOB:  1960-06-24, LOS: 75 ADMISSION DATE:  09/28/2021, CONSULTATION DATE:  09/29/2021 REFERRING MD:  Kipp Brood - TCTS CHIEF COMPLAINT: Dyspnea   History of Present Illness:  61 year old man who underwent CABG on 8/3, readmitted on 8/19 in setting of dyspnea with a pericardial effusion requiring pericardial window via bedside sternotomy. Also with bilateral pleural effusions. Sustained PEA arrest on 8/20, briefly required [facility] ECMO which was discontinued on 8/28, Impella placed 8/28, started on CRRT.  Hypoxemia, repeat cardiac arrest on 8/30. Underwent mediastinal washout/sternotomy closure 9/2. Impella removed and tracheostomy completed 9/7.  Pertinent Medical History:  CAD s/p CABG 8/3 DM2 Diverticulosis GERD Hyperlipidemia Hypertension History of stroke Tobacco Abuse  Significant Hospital Events: Including procedures, antibiotic start and stop dates in addition to other pertinent events   8/19 Admitted, s/p pericardial window 8/20 PEA cardiac arrest, left pigtail chest tube placement, PEA cardiac arrest due to tamponade, bedside sternotomy with pericardial drains placed. 8/21 Return to OR for overnight bleeding.  8/22 Minimal chest tube output.  Atrial fibrillation, controled with amiodarone.  8/22 TEE showed EF 35% at baseline, which improved significantly with decreasing ECMO flow.  8/23 Tolerated diuresis  8/24 Mediastinal washout, removal of hematoma 8/25 Ongoing bleeding issues from posterior mediastinum 8/28 Decannulated and off ECMO, 5.5 Impella placed, L femoral HD cath placed and started on CRRT 8/30 Hypoxia and hypotension followed by brief arrest, gentle L lateral chest compressions performed and ROSC in roughly 1 minute. 8/31 Remains deeply sedated on ventilator with open chest.  No acute events overnight. 9/1 No acute events overnight, remains on ventilator deep sedation.  Tolerating aggressive volume removal per CRRT 9/2 Sternotomy  closure 9/3 Wean versed 9/4 FOB, low grade temps, notable thick mucus/frequent suctioning 9/5 Vomiting, ?mucus plugging. PSV 5-6 hours on 10/5 9/6 Impella down to p3 9/7 Impella removal and trach. PRBC and DDAVP in OR  9/8 Low dose epi, weaned on 8/8 9/9 RIJ HD catheter placed 9/11 Started on ATC at 28% and tolerating well 9/12 Remains on 28% ATC 9/13 SB/Junction rhythm with associated hypotension overnight requiring transient pressor escalation. Pressor requirement down in AM. WBC count 23 (18), Co-ox 36%. Resp/Blood Cx. 9/14 Mild bradycardia/relative hypotension with Precedex. Remains on Epi 76mcg, Vaso 0.03. Resp Cx with few gram+ cocci (clusters), yeast. BCx NGTD. 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 Co-ox 48%. CRRT stopped. 9/15 WBC continues to downtrend. Hgb 7.6. Net +1.49L/24H. CRRT resumed. Co-ox 51%. NE started to help with Epi wean. 9/16, Co. oximetry improved, WBC trending down, weaning pressors 9/19 on trach collar for 96 hours.  Weaning off vasopressors.  Trial off CRRT today.  Episode of overnight emesis. 9/20 more awake. CRRT off 9/21 cuffless tracheostomy. Do well with PMV 9/22 failed swallow evaluation 9/25 Tolerating trach capping on RA. Up to chair. Trach decannulation today. Tunneled dialysis catheter placement with IR. 9/26 Doing well post-trach decannulation. Able to phonate with light pressure. S/p TDC with IR, some catheter flow issues overnight. Reattempting HD today.  Interim History / Subjective:  No significant events overnight Slow flow issues with TDC, reattempting HD today at bedside Feeling better overall, got some sleep S/p trach decannulation 9/25 Able to phonate with light pressure over stoma Continues to work with PT  Objective:  Blood pressure (!) 105/53, pulse 66, temperature 98.2 F (36.8 C), temperature source Axillary, resp. rate 20, height 5\' 7"  (1.702 m), weight 87.3 kg, SpO2 100 %.  Intake/Output Summary (Last 24 hours) at 11/05/2021 1046 Last data filed at 11/05/2021 0800 Gross per 24 hour  Intake 1367.37 ml  Output 30 ml  Net 1337.37 ml    Filed Weights   11/03/21 0500 11/04/21 0500 11/05/21 0457  Weight: 84.9 kg 91.4 kg 87.3 kg   Physical Examination: General: Chronically ill-appearing middle-aged man in NAD. Awake/alert, conversant. HEENT: [place]/AT, anicteric sclera, PERRL, dry mucous membranes. Cortrak in place. S/p tracheostomy decannulation; stoma covered with gauze/tape. Neuro: Awake, oriented x 4. Responds to verbal stimuli. Following commands consistently. Moves all 4 extremities spontaneously. Generalized weakness.   CV: RRR, no m/g/r. PULM: Breathing even and unlabored on RA. Lung fields slightly coarse bilaterally. GI: Soft, nontender, mildly distended. Hypoactive bowel sounds. Extremities: No LE edema noted. Skin: Warm/dry, no rashes.  Assessment & Plan:   Acute metabolic encephalopathy.   Agitated at times with poor impulse control. - Limit sedating medications as able - Continue Klonopin, oxycodone PRN - Seroquel QHS  Hemorrhagic pericarditis with tamponade PEA arrest due to tamponade, s/p emergent sternotomy Cardiogenic & hemorrhagic shock, s/p [facility] ECMO (decannulated 8/29), s/p Impella (removed 9/7); off all vasopressors. EF now normal. CAD s/p CABG x 3 Afib  - TCTS primary - Goal MAP > 60 - Levophed titrated to goal MAP, off as of 9/25 - Continue midodrine 20mg  TID - Cardiac monitoring  Leukocytosis, afebrile  Completed course of daptomycin, completed and meropenem. LE/UE Dopplers negative for DVTs. - Continue micafungin x 14-day course (end 9/27) - Low threshold for panculture if patient becomes febrile  Acute respiratory failure with hypoxemia and hypercarbia S/p Tracheostomy  Downsized to cuffless trach 9/22. Capped 9/24. Decannulated 9/25. - S/p tracheostomy decannulation 9/25, tolerating well - Light pressure to phonate,  cough - Pulmonary hygiene - Repeat swallow study as clinically appropriate, SLP following  AKI with anuria  Hypophosphatemia - Nephro following - iHD today, 9/26 - S/p TDC placement 9/25 with IR - Trend BMP - Replete electrolytes as indicated - Monitor I&Os - Avoid nephrotoxic agents as able  DM II with hyperglycemia - SSI - CBGs Q4H - Goal CBG 140-180  At Risk Malnutrition  - TF via Cortrak - SLP following, failed swallow 9/22 - Now decannulated; consider FEES  Sacral pressure wound - WOC following  Severe physical deconditioning - PT/OT/SLP as able - Evaluate for CIR candidacy once medically ready  Best Practice (right click and "Reselect all SmartList Selections" daily)  Diet/type: tubefeeds DVT prophylaxis: prophylactic heparin  GI prophylaxis: PPI Lines: N/A Foley:  N/A Code Status:  full code Last date of multidisciplinary goals of care discussion patient updated daily.  Critical care time:   The patient is critically ill with multiple organ system failure and requires high complexity decision making for assessment and support, frequent evaluation and titration of therapies, advanced monitoring, review of radiographic studies and interpretation of complex data.   Critical Care Time devoted to patient care services, exclusive of separately billable procedures, described in this note is 32 minutes.  Lestine Mount, PA-C [facility] 11/05/21 10:46 AM  Please see [email or web address] for pager details.  From 7A-7P if no response, please call [phone] After hours, please call ELink [phone]

## 2021-11-05 NOTE — Progress Notes (Addendum)
Patient ID: [name], male   DOB: [birth year], 61 y.o.   MRN: [id number]   Advanced Heart Failure Rounding Note   Subjective:    - 8/19 Pericardial window - 8/20 Cardiac arrest with tamponade -> Emergent bedside washout - 09/29/21 VA Cannulation - 09/30/21 Return to OR for mediastinal hemorrhage - 10/01/21 Developed AF -> amio - 10/02/21 TEE EF 25-30%  - 10/04/21 OR for washout. C/b continued bleeding overnight - 10/07/21 Placement of Impella 5.5 with washout, VA ECMO decannulation. Hypotensive with development of severe RV dysfunction after ECMO off and pressors titrated up. Multiple units of blood products. - 10/08/21 Brief PEA arrest. AFL with RVR >> S/p DCCV to SR, back in AFL shortly after - 8/31 Give 1UPRBCs  - 9/2 OR for chest closure - 9/3 Hypotensive overnight w/ SBPs in 80s. Febrile, mTemp 100.8. CRRT paused. VP increased to 0.04.  - 9/4 s/p bronchoscopy w/ BAL by PCCM, Cx NGTD  - 9/5 vomiting w/ large volume NGT output + watery/foul diarrhea, Tube feeds held. No signs of ileus on KUB. C-diff negative   - 9/7 OR for Impella Extraction and percutaneous tracheostomy. 2 u RBCs. - 9/8 CVVH stopped and line removed for holiday - 9/9 CVVH restarted.  -9/13 Worsening leukocytosis and pressor requirements. Purulent drainage from arterial line. Started Daptomycin, Micafungin and Meropenem. - 9/17 Daptomycin + Micafungin stopped. Remains on Meropenum  -9/19 CRRT holiday.  -9/20 Trach downsized  -9/21 transitioned to iHD  - 9/25 Tunneled HD cath placed   iHD attempted overnight but had cath flow issues, attempting HD again this morning   K 6.4  Mg 2.3   Currently off NE. On Midodrine 20 tid. BPs low 540G systolic   NSR on tele.   No current complaints.   Objective:     Vital Signs:   Temp:  [97.6 F (36.4 C)-98.3 F (36.8 C)] 98.2 F (36.8 C) (09/26 0800) Pulse Rate:  [58-86] 66 (09/26 0800) Resp:  [13-33] 20 (09/26 0800) BP: (87-150)/(39-82) 105/53 (09/26 0800) SpO2:   [88 %-100 %] 100 % (09/26 0800) Weight:  [87.3 kg] 87.3 kg (09/26 0457) Last BM Date : 11/05/21  Weight change: Filed Weights   11/03/21 0500 11/04/21 0500 11/05/21 0457  Weight: 84.9 kg 91.4 kg 87.3 kg    Intake/Output:   Intake/Output Summary (Last 24 hours) at 11/05/2021 0917 Last data filed at 11/05/2021 0800 Gross per 24 hour  Intake 1398.31 ml  Output 30 ml  Net 1368.31 ml   PHYSICAL EXAM: General:  chronically ill appearing. No respiratory difficulty HEENT: normal + cor trak  Neck: supple. no JVD. + trach,  + lt IJ THD cath Carotids 2+ bilat; no bruits. No lymphadenopathy or thyromegaly appreciated. Cor: PMI nondisplaced. Regular rate & rhythm. + sternal dressing  Lungs: CTAB no wheezing  Abdomen: soft, nontender, nondistended. No hepatosplenomegaly. No bruits or masses. Good bowel sounds. Extremities: no cyanosis, clubbing, rash, edema Neuro: alert & oriented x 3, cranial nerves grossly intact. moves all 4 extremities w/o difficulty. Affect pleasant.   Telemetry: SR 80s   Labs: Basic Metabolic Panel: Recent Labs  Lab 11/01/21 0344 11/02/21 0439 11/03/21 0641 11/04/21 0421 11/05/21 0409  NA 136 131* 134* 132* 127*  K 3.8 3.5 4.1 4.9 6.4*  CL 94* 92* 93* 90* 90*  CO2 _0 20*  GLUCOSE 107* 219* 175* 133* 150*  BUN 65* 114* 77* 122* 141*  CREATININE 3.44* 5.53* 3.79* 5.11* 6.34*  CALCIUM 9.2 9.1 9.1  9.3 9.1  MG 2.0 2.4 2.0 2.2 2.3  PHOS 4.4 6.5* 2.7 3.0 5.7*    Liver Function Tests: Recent Labs  Lab 11/01/21 0344 11/02/21 0439 11/03/21 0641 11/04/21 0421 11/05/21 0409  ALBUMIN 2.6* 2.6* 2.7* 2.7* 2.5*   No results for input(s): "LIPASE", "AMYLASE" in the last 168 hours. No results for input(s): "AMMONIA" in the last 168 hours.  CBC: Recent Labs  Lab 10/31/21 0506 11/01/21 0344 11/02/21 0439 11/03/21 0641 11/04/21 0421  WBC 25.5* 24.1* 24.7* 24.2* 25.7*  HGB 8.0* 7.5* 7.3* 7.0* 7.5*  HCT 27.4* 24.9* 24.0* 23.7* 24.9*  MCV 100.0  98.4 99.2 100.9* 98.0  PLT 390 426* 445* 437* 432*    Cardiac Enzymes: No results for input(s): "CKTOTAL", "CKMB", "CKMBINDEX", "TROPONINI" in the last 168 hours.   BNP: BNP (last 3 results) Recent Labs    12/24/20 0651 09/28/21 0405  BNP 412.0* 826.6*    ProBNP (last 3 results) No results for input(s): "PROBNP" in the last 8760 hours.    Other results:  Imaging: IR Fluoro Guide CV Line Left  Result Date: 11/05/2021 INDICATION: 18 year old with acute kidney injury and needs a tunneled dialysis catheter. EXAM: FLUOROSCOPIC AND ULTRASOUND GUIDED PLACEMENT OF A TUNNELED DIALYSIS CATHETER Physician: Stephan Minister. Anselm Pancoast, MD MEDICATIONS: Ancef 2 g, Dilaudid 1 mg ANESTHESIA/SEDATION: The patient was continuously monitored during the procedure by the interventional radiology nurse under my direct supervision. FLUOROSCOPY TIME:  Radiation Exposure Index (as provided by the fluoroscopic device): 3.1 mGy Kerma COMPLICATIONS: None immediate. PROCEDURE: Informed consent was obtained for placement of a tunneled dialysis catheter. The patient was placed supine on the interventional table. Ultrasound confirmed a patent left internal jugular vein. Ultrasound image obtained for documentation. The left neck and chest was prepped and draped in a sterile fashion. Maximal barrier sterile technique was utilized including caps, mask, sterile gowns, sterile gloves, sterile drape, hand hygiene and skin antiseptic. The left neck was anesthetized with 1% lidocaine. A small incision was made with #11 blade scalpel. A 21 gauge needle directed into the left internal jugular vein with ultrasound guidance. A micropuncture dilator set was placed. A 23 cm tip to cuff Palindrome catheter was selected. The skin below the left clavicle was anesthetized and a small incision was made with an #11 blade scalpel. A subcutaneous tunnel was formed to the vein dermatotomy site. The catheter was brought through the tunnel. The vein  dermatotomy site was dilated to accommodate a peel-away sheath. The catheter was placed through the peel-away sheath and directed into the central venous structures. The tip of the catheter was placed at superior cavoatrial junction with fluoroscopy. Fluoroscopic images were obtained for documentation. Both lumens were found to aspirate and flush well. The proper amount of heparin was flushed in both lumens. The vein dermatotomy site was closed using a single layer of absorbable suture and Dermabond. The catheter was secured to the skin using Prolene suture. IMPRESSION: Successful placement of a left jugular tunneled dialysis catheter using ultrasound and fluoroscopic guidance. Electronically Signed   By: Markus Daft M.D.   On: 11/05/2021 08:23   IR US Guide Vasc Access Left  Result Date: 11/05/2021 INDICATION: 54 year old with acute kidney injury and needs a tunneled dialysis catheter. EXAM: FLUOROSCOPIC AND ULTRASOUND GUIDED PLACEMENT OF A TUNNELED DIALYSIS CATHETER Physician: Stephan Minister. Anselm Pancoast, MD MEDICATIONS: Ancef 2 g, Dilaudid 1 mg ANESTHESIA/SEDATION: The patient was continuously monitored during the procedure by the interventional radiology nurse under my direct supervision. FLUOROSCOPY TIME:  Radiation Exposure  Index (as provided by the fluoroscopic device): 3.1 mGy Kerma COMPLICATIONS: None immediate. PROCEDURE: Informed consent was obtained for placement of a tunneled dialysis catheter. The patient was placed supine on the interventional table. Ultrasound confirmed a patent left internal jugular vein. Ultrasound image obtained for documentation. The left neck and chest was prepped and draped in a sterile fashion. Maximal barrier sterile technique was utilized including caps, mask, sterile gowns, sterile gloves, sterile drape, hand hygiene and skin antiseptic. The left neck was anesthetized with 1% lidocaine. A small incision was made with #11 blade scalpel. A 21 gauge needle directed into the left internal  jugular vein with ultrasound guidance. A micropuncture dilator set was placed. A 23 cm tip to cuff Palindrome catheter was selected. The skin below the left clavicle was anesthetized and a small incision was made with an #11 blade scalpel. A subcutaneous tunnel was formed to the vein dermatotomy site. The catheter was brought through the tunnel. The vein dermatotomy site was dilated to accommodate a peel-away sheath. The catheter was placed through the peel-away sheath and directed into the central venous structures. The tip of the catheter was placed at superior cavoatrial junction with fluoroscopy. Fluoroscopic images were obtained for documentation. Both lumens were found to aspirate and flush well. The proper amount of heparin was flushed in both lumens. The vein dermatotomy site was closed using a single layer of absorbable suture and Dermabond. The catheter was secured to the skin using Prolene suture. IMPRESSION: Successful placement of a left jugular tunneled dialysis catheter using ultrasound and fluoroscopic guidance. Electronically Signed   By: Markus Daft M.D.   On: 11/05/2021 08:23   VAS [place] UPPER EXTREMITY VENOUS DUPLEX  Result Date: 11/04/2021 UPPER VENOUS STUDY  Patient Name:  HENRIQUE PAREKH [facility]  Date of Exam:   11/03/2021 Medical Rec #: [id number]     Accession #:    [id number] Date of Birth: 29-Feb-1960     Patient Gender: M Patient Age:   57 years Exam Location:  [facility] Procedure:      VAS [place] UPPER EXTREMITY VENOUS DUPLEX Referring Phys: Hayden Pedro --------------------------------------------------------------------------------  Indications: Edema Limitations: Trach collar, line and bandages. Comparison Study: No prior study Performing Technologist: Sharion Dove RVS  Examination Guidelines: A complete evaluation includes B-mode imaging, spectral Doppler, color Doppler, and power Doppler as needed of all accessible portions of each vessel. Bilateral testing is considered an integral  part of a complete examination. Limited examinations for reoccurring indications may be performed as noted.  Right Findings: +----------+------------+---------+-----------+----------+--------------+ RIGHT     CompressiblePhasicitySpontaneousProperties   Summary     +----------+------------+---------+-----------+----------+--------------+ IJV                                                 Not visualized +----------+------------+---------+-----------+----------+--------------+ Subclavian               Yes       Yes                             +----------+------------+---------+-----------+----------+--------------+ Axillary                 Yes       Yes                             +----------+------------+---------+-----------+----------+--------------+  Brachial      Full                                                 +----------+------------+---------+-----------+----------+--------------+ Radial        Full                                                 +----------+------------+---------+-----------+----------+--------------+ Ulnar         Full                                                 +----------+------------+---------+-----------+----------+--------------+ Cephalic      Full                                                 +----------+------------+---------+-----------+----------+--------------+ Basilic       Full                                                 +----------+------------+---------+-----------+----------+--------------+  Left Findings: +----------+------------+---------+-----------+----------+--------------+ LEFT      CompressiblePhasicitySpontaneousProperties   Summary     +----------+------------+---------+-----------+----------+--------------+ IJV                                                 Not visualized +----------+------------+---------+-----------+----------+--------------+ Subclavian               Yes        Yes                             +----------+------------+---------+-----------+----------+--------------+ Axillary                 Yes       Yes                             +----------+------------+---------+-----------+----------+--------------+ Brachial      Full                                                 +----------+------------+---------+-----------+----------+--------------+ Radial        Full                                                 +----------+------------+---------+-----------+----------+--------------+ Ulnar  Not visualized +----------+------------+---------+-----------+----------+--------------+ Cephalic      Full                                                 +----------+------------+---------+-----------+----------+--------------+ Basilic                                             Not visualized +----------+------------+---------+-----------+----------+--------------+  Summary:  Right: No evidence of deep vein thrombosis in the visualized veins of the upper extremity. No evidence of superficial vein thrombosis in the upper extremity.  Left: No evidence of deep vein thrombosis in the visualized veins of the upper extremity. No evidence of superficial vein thrombosis in the upper extremity.  *See table(s) above for measurements and observations.  Diagnosing physician: Jamelle Haring Electronically signed by Jamelle Haring on 11/04/2021 at 11:14:46 AM.    Final    VAS [place] LOWER EXTREMITY VENOUS (DVT)  Result Date: 11/04/2021  Lower Venous DVT Study Patient Name:  VISHNU MOELLER [facility]  Date of Exam:   11/03/2021 Medical Rec #: [id number]     Accession #:    [id number] Date of Birth: [birth year]     Patient Gender: M Patient Age:   61 years Exam Location:  [facility] Procedure:      VAS [place] LOWER EXTREMITY VENOUS (DVT) Referring Phys: Hayden Pedro  --------------------------------------------------------------------------------  Indications: Edema.  Limitations: Patient not cooperative. Comparison Study: No prior study Performing Technologist: Sharion Dove RVS  Examination Guidelines: A complete evaluation includes B-mode imaging, spectral Doppler, color Doppler, and power Doppler as needed of all accessible portions of each vessel. Bilateral testing is considered an integral part of a complete examination. Limited examinations for reoccurring indications may be performed as noted. The reflux portion of the exam is performed with the patient in reverse Trendelenburg.  +--------+---------------+---------+-----------+----------------+-------------+ RIGHT   CompressibilityPhasicitySpontaneityProperties      Thrombus                                                                 Aging         +--------+---------------+---------+-----------+----------------+-------------+ CFV     Full                               pulsatile                                                                waveforms                     +--------+---------------+---------+-----------+----------------+-------------+ SFJ     Full                                                             +--------+---------------+---------+-----------+----------------+-------------+  FV Prox Full                                                             +--------+---------------+---------+-----------+----------------+-------------+ FV Mid  Full                                                             +--------+---------------+---------+-----------+----------------+-------------+ FV      Full                                                             Distal                                                                   +--------+---------------+---------+-----------+----------------+-------------+ PFV     Full                                                              +--------+---------------+---------+-----------+----------------+-------------+ POP     Full                               pulsatile                                                                waveforms                     +--------+---------------+---------+-----------+----------------+-------------+   +---------+---------------+---------+-----------+---------------+--------------+ LEFT     CompressibilityPhasicitySpontaneityProperties     Thrombus Aging +---------+---------------+---------+-----------+---------------+--------------+ CFV                                         pulsatile      patent by                                                  waveforms      color and  Doppler        +---------+---------------+---------+-----------+---------------+--------------+ FV Prox  Full                                                             +---------+---------------+---------+-----------+---------------+--------------+ FV Mid   Full                                                             +---------+---------------+---------+-----------+---------------+--------------+ FV DistalFull                                                             +---------+---------------+---------+-----------+---------------+--------------+ PFV      Full                                                             +---------+---------------+---------+-----------+---------------+--------------+ POP      Full                               pulsatile                                                                 waveforms                     +---------+---------------+---------+-----------+---------------+--------------+ PTV      Full                                                              +---------+---------------+---------+-----------+---------------+--------------+ PERO     Full                                                             +---------+---------------+---------+-----------+---------------+--------------+     Summary: BILATERAL: - No evidence of deep vein thrombosis seen in the lower extremities, bilaterally. -No evidence of popliteal cyst, bilaterally.   *See table(s) above for measurements and observations. Electronically signed by Jamelle Haring on 11/04/2021 at 11:14:35 AM.    Final      Medications:     Scheduled Medications:  ascorbic acid  250 mg Per Tube BID  aspirin  81 mg Per Tube Daily   Chlorhexidine Gluconate Cloth  6 each Topical Daily   collagenase   Topical Daily   darbepoetin (ARANESP) injection - NON-DIALYSIS  100 mcg Subcutaneous Q Mon-1800   feeding supplement (PROSource TF20)  60 mL Per Tube TID   feeding supplement (VITAL 1.5 CAL)  1,000 mL Per Tube Q24H   fiber  1 packet Per Tube BID   folic acid  1 mg Per Tube Daily   Gerhardt's butt cream   Topical Daily   heparin injection (subcutaneous)  5,000 Units Subcutaneous Q8H   insulin aspart  0-20 Units Subcutaneous Q4H   insulin aspart  2 Units Subcutaneous Q4H   insulin glargine-yfgn  10 Units Subcutaneous QHS   midodrine  20 mg Per Tube TID WC   multivitamin  1 tablet Per Tube QHS   nutrition supplement (JUVEN)  1 packet Per Tube BID BM   mouth rinse  15 mL Mouth Rinse 4 times per day   pantoprazole  40 mg Per Tube BID   QUEtiapine  50 mg Per Tube QHS   sodium chloride flush  3 mL Intravenous Q12H   thiamine  100 mg Per Tube Daily    Infusions:  sodium chloride 10 mL/hr at 11/05/21 0800   sodium chloride Stopped (11/04/21 1021)   albumin human 12.5 g (11/01/21 2248)   anticoagulant sodium citrate      ceFAZolin (ANCEF) IV     ferric gluconate (FERRLECIT) IVPB Stopped (11/04/21 2242)   micafungin (MYCAMINE) 100 mg in sodium chloride 0.9 % 100 mL IVPB Stopped (11/04/21  1123)   norepinephrine (LEVOPHED) Adult infusion Stopped (11/05/21 0501)    PRN Medications: sodium chloride, acetaminophen (TYLENOL) oral liquid 160 mg/5 mL, albumin human, anticoagulant sodium citrate, camphor-menthol, clonazePAM, dextrose, diphenhydrAMINE, heparin, HYDROmorphone (DILAUDID) injection, ipratropium-albuterol, ondansetron (ZOFRAN) IV, mouth rinse, [EXPIRED] oxyCODONE **FOLLOWED BY** oxyCODONE, polyethylene glycol, polyvinyl alcohol, sodium chloride flush, white petrolatum   Assessment/Plan:    1. Shock - mixed cardiogenic/hemorrhagic -> VA ECMO -> decannulated on 8/28 to Impella 5.5 - Echo 08/28: Underfilled LV, EF 55-60% with severe LVH and near normal RV systsystolic function.  - Impella extracted 9/7 -Bedside echo 09/13 - LV function preserved, RV mildly reduced. Small clot in posterior pericardium but no hemodynamic effect - Concern for worsening sepsis with leukocytosis and increasing pressor requirements on 09/13. Purulent drainage from [place], replaced. - BC X 2 NGTD, tracheal aspirate pending. Procalcitonin 1.63.  - Given prolonged abx course earlier in stay, addition of Daptomycin, Micafungin and meropenem recommended per ID.  - Plan to complete total of 14 days of Micafungin .  - off NE. On midodrine 20 mg tid.    2. Cardiac arrest (PEA/bradycardic) - 8/19 in setting of tamponade - PEA arrest again on 08/28  3. Cardiac tamponade with emergent bedside sternotomy  - Diffuse epicardial bleeding with post-op Dresslers - return to OR 8/21 and 8/24 for washouts.  - Washout 8/28 in OR. Multiple units of blood products in OR.   - Chest closed on 9/2 - Hgb 8.1>7.6>8.2> 8>7.5   4. Acute hypoxemic respiratory failure - Off ECMO.  -  Perc Trach placed 9/7  - s/p bronchoscopy w/ BAL 9/4.  - tracheal aspirate 09/13 growing few GPC and rare yeast  - Started back on IV abx as above d/t concern for sepsis.  - Trach downsized - Management per CCM  5. AKI due to ATN -  CRRT started 08/28.  - Remains on  anuric - 9/19 given CRRT holiday.  - Transitioned to [facility] 9/21. 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 Now off colchicine   7. 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 Continue aspirin 81 mg daily  8. DM2 - continue SSI   9. PAF/AFL - Tolerates poorly.  - Recurrent AFL. S/p DCCV  to SR 08/29.  - Off amio d/t bradycardia - Not on [facility] for now d/t bleeding, DVT dose heparin.  - In NSR   10. ID - Completed vancomycin/meropenem with open chest.  - WBC 17.2 >23> 26>23>>24>>25. ? Possible aspiration with vomiting.  - now off meropenum    11. Neuro Oriented x3  12. FEN - TFs ongoing .   13. Hyperkalemia - 6.4 today  - plan iHD today   14. Unstageable Pressure Ulcer, Buttock  - WOC following.   15. Abdominal Pain - Resolved.     Lyda Jester, PA-C  9:17 AM  Patient seen and examined with the above-signed Advanced Practice Provider and/or Housestaff. I personally reviewed laboratory data, imaging studies and relevant notes. I independently examined the patient and formulated the important aspects of the plan. I have edited the note to reflect any of my changes or salient points. I have personally discussed the plan with the patient and/or family.  Lurline Idol out. Off NE. Working with speech path. C/o sacral pain.   Tunneled cath in but wasn't working last night.  Will try again today.  K 6.4  General:  Sitting up in bed No resp difficulty HEENT: normal Neck: supple. JVP 9-10 Trach site dressed Carotids 2+ bilat; no bruits. No lymphadenopathy or thryomegaly appreciated. Cor: Chest dressing ok. Regular rate & rhythm. No rubs, gallops or murmurs. Lungs: clear Abdomen: soft, nontender, nondistended. No hepatosplenomegaly. No bruits or masses. Good bowel sounds. Extremities: no cyanosis, clubbing, rash, edema Neuro: alert & orientedx3, cranial nerves grossly intact.  moves all 4 extremities. Weak Affect pleasant  Continues to progress slowly. BP soft but stable on midodrine. Hopefully he can tolerate iHD. Remains in NSR. Continue PT/OT and Wound care.   Glori Bickers, MD  11:02 AM

## 2021-11-05 NOTE — Progress Notes (Signed)
Patient ID: [name], male   DOB: August 14, 1960, 61 y.o.   MRN: [id number]    Progress Note from the Palliative Medicine Team at [facility]   Patient Name: [name]        Date: 11/05/2021 DOB: 03/01/1960  Age: 61 y.o. MRN#: [id number] Attending Physician: [name], MD Primary Care Physician: [name], MD Admit Date: 09/28/2021   Medical records reviewed, discussed with treatment team, assessed patient at bedside  61 y.o. 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 Bradycardic and hypoglycemic.   ECHO obtained which showed LVEF 50%, grade II diastolic dysfunction and moderate pericardial effusion with possible tamponade physiology.    CT Chest showed pericardial effusion and small bilateral pleural effusions. He was taken to the OR 8/19 for pericardial window.   Complicated hospitalization, today is day [name] Hospital Events 8/19 admitted, s/p pericardial window 8/20 PEA cardiac arrest, left pigtail chest tube placement, PEA cardiac arrest due to tamponade, bedside sternotomy with pericardial drains placed. 8/21 return to OR for overnight bleeding. ECMO 8/22 minimal chest tube output.  Atrial fibrillation, controled with amiodarone.  8/22 TEE showed EF 35% at baseline, which improved significantly with decreasing ECMO flow.  8/23 tolerated diuresis  8/24 mediastinal washout, removal of hematoma 8/25 ongoing bleeding issues from posterior mediastinum 8/29 decannulated and off ECMO, 5.5 Impella placed, L femoral HD cath placed and started on CRRT 8/30 hypoxia and hypotension followed by brief arrest, gentle L lateral chest compressions performed and ROSC in roughly 1 minute. 8/31 remains deeply sedated on ventilator with open chest.  No acute events overnight 9/1 no acute events overnight, remains on ventilator deep sedation.   Tolerating aggressive volume removal per CRRT 9/2 sternotomy closure 9/4 FOB, low grade temps, notable thick mucus/frequent suctioning  - 9/3 Hypotensive overnight w/ SBPs in 80s. Febrile, mTemp 100.8. CRRT paused. VP increased to 0.04.  - 9/4 s/p bronchoscopy w/ BAL by PCCM, Cx NGTD  - 9/5 vomiting w/ large volume NGT output + watery/foul diarrhea, Tube feeds held. No signs of ileus on KUB. C-diff negative   - 9/7 OR for Impella Extraction and percutaneous tracheostomy. 2 u RBCs. - 9/8 CVVH stopped and line removed for holiday - 9/9 CVVH restarted.  -9/13 Worsening leukocytosis and pressor requirements. Purulent drainage from arterial line. Started Daptomycin, Micafungin and Meropenem. - 9/17 Daptomycin + Micafungin stopped. Remains on Meropenum  -9/19 CRRT holiday.  9-19 patient is demonstrating steps and progression, follows  commands, working with therapies, no urine output and worsening BUN and creatinine 9/20 more awake. CRRT off 9/21 cuffless tracheostomy. Do well with PMV 9/22 failed swallow evaluation 9/25 Tolerating trach capping on RA. Up to chair. Trach decannulation today. Tunneled dialysis catheter placement with IR. 9/26 Doing well post-trach decannulation. Able to phonate with light pressure. S/p TDC with IR, some catheter flow issues overnight. Reattempting HD today.   Initial palliative medicine consult completed on 09/30/2021.  Established decision makers at that time.  [name] patient's,  legal wife is main contact and decision maker in the event patient does not have decision-making capacity for himself.  Patient and his wife are separated.    Patient's friend [name] and [name] are main support persons   Patient remains critically ill, high risk for decompensation.    I spoke to [name]/legal spouse, I  updated her on current medical situation.  We discussed best case scenario versus worst-case scenario and patient's long term anticipated care needs.  Although  [name] is the main Media planner for [name] in the event that he cannot make decisions for himself, she has been unable to visit and see the patient in person as she lives some distance away.  She remain hopeful for ongoing treatment, stabilization and improvement.  Today [name].  Attempted to evaluate patient's understanding and insight into his current medical situation.  I believe he is aware that he is in the hospital, that he is seriously ill.  He expresses hope for increased comfort and he also communicates desire for ongoing treatments and hope for improvement.  Even with that exploration and communication , I do not believe that patient has capacity to make medical decisions for himself at this time.  PMT will continue to support holistically  Discussed with treatment team.    [name]  Palliative Medicine Team Team Phone # [phone] Pager [phone]

## 2021-11-05 NOTE — TOC CM/SW Note (Signed)
HF TOC CM received SS form 3368 that needed to be completed from pt's wife. Spoke to pt and he was able to sign [employer] disability application. Faxed to [facility]. [facility] and spoke to rep. States that form should have been completed when he initially applied for his disability. Left message for DAP assistance coordinator, Mickel Baas for return call. Pt's deadline to have information completed is 11/10/2021. Wife states she did reach [employer] and pt just need to make the payment of $17 to keep COBRA. States she will make payment so he can establish COBRA. She will give call back to CM with update on Thursday. [place], Heart Failure TOC CM [phone]

## 2021-11-06 ENCOUNTER — Inpatient Hospital Stay (HOSPITAL_COMMUNITY): Payer: Self-pay

## 2021-11-06 LAB — TYPE AND SCREEN
ABO/RH(D): O POS
Antibody Screen: NEGATIVE
Unit division: 0
Unit division: 0

## 2021-11-06 LAB — RENAL FUNCTION PANEL
Albumin: 2.5 g/dL — ABNORMAL LOW (ref 3.5–5.0)
Anion gap: 17 — ABNORMAL HIGH (ref 5–15)
BUN: 62 mg/dL — ABNORMAL HIGH (ref 8–23)
CO2: 24 mmol/L (ref 22–32)
Calcium: 8.7 mg/dL — ABNORMAL LOW (ref 8.9–10.3)
Chloride: 93 mmol/L — ABNORMAL LOW (ref 98–111)
Creatinine, Ser: 4.1 mg/dL — ABNORMAL HIGH (ref 0.61–1.24)
GFR, Estimated: 16 mL/min — ABNORMAL LOW (ref >60)
Glucose, Bld: 172 mg/dL — ABNORMAL HIGH (ref 70–99)
Phosphorus: 4 mg/dL (ref 2.5–4.6)
Potassium: 4.4 mmol/L (ref 3.5–5.1)
Sodium: 134 mmol/L — ABNORMAL LOW (ref 135–145)

## 2021-11-06 LAB — CBC
HCT: 20.5 % — ABNORMAL LOW (ref 39.0–52.0)
HCT: 27.9 % — ABNORMAL LOW (ref 39.0–52.0)
Hemoglobin: 6.1 g/dL — CL (ref 13.0–17.0)
Hemoglobin: 8.6 g/dL — ABNORMAL LOW (ref 13.0–17.0)
MCH: 29.6 pg (ref 26.0–34.0)
MCH: 29.5 pg (ref 26.0–34.0)
MCHC: 29.8 g/dL — ABNORMAL LOW (ref 30.0–36.0)
MCHC: 30.8 g/dL (ref 30.0–36.0)
MCV: 99.5 fL (ref 80.0–100.0)
MCV: 95.5 fL (ref 80.0–100.0)
Platelets: 433 10*3/uL — ABNORMAL HIGH (ref 150–400)
Platelets: 359 10*3/uL (ref 150–400)
RBC: 2.06 MIL/uL — ABNORMAL LOW (ref 4.22–5.81)
RBC: 2.92 MIL/uL — ABNORMAL LOW (ref 4.22–5.81)
RDW: 22.7 % — ABNORMAL HIGH (ref 11.5–15.5)
RDW: 21.5 % — ABNORMAL HIGH (ref 11.5–15.5)
WBC: 18.4 10*3/uL — ABNORMAL HIGH (ref 4.0–10.5)
WBC: 17.9 10*3/uL — ABNORMAL HIGH (ref 4.0–10.5)
nRBC: 2.7 % — ABNORMAL HIGH (ref 0.0–0.2)
nRBC: 1.1 % — ABNORMAL HIGH (ref 0.0–0.2)

## 2021-11-06 LAB — BPAM RBC
Blood Product Expiration Date: 202310282359
Blood Product Expiration Date: 202310292359
ISSUE DATE / TIME: 202309261559
ISSUE DATE / TIME: 202309261559
Unit Type and Rh: 5100
Unit Type and Rh: 5100

## 2021-11-06 LAB — GLUCOSE, CAPILLARY
Glucose-Capillary: 172 mg/dL — ABNORMAL HIGH (ref 70–99)
Glucose-Capillary: 141 mg/dL — ABNORMAL HIGH (ref 70–99)
Glucose-Capillary: 144 mg/dL — ABNORMAL HIGH (ref 70–99)
Glucose-Capillary: 140 mg/dL — ABNORMAL HIGH (ref 70–99)
Glucose-Capillary: 160 mg/dL — ABNORMAL HIGH (ref 70–99)

## 2021-11-06 LAB — MAGNESIUM: Magnesium: 1.8 mg/dL (ref 1.7–2.4)

## 2021-11-06 MED ORDER — DARBEPOETIN ALFA 100 MCG/0.5ML IJ SOSY
100.0000 ug | PREFILLED_SYRINGE | INTRAMUSCULAR | Status: DC
  Administered 2021-11-12 – 2021-12-03 (×5): 100 ug via INTRAVENOUS
  Filled 2021-11-06 (×9): qty 0.5

## 2021-11-06 MED ORDER — DEXMEDETOMIDINE HCL IN NACL 400 MCG/100ML IV SOLN
0.4000 ug/kg/h | INTRAVENOUS | Status: DC
  Administered 2021-11-06: 0.4 ug/kg/h via INTRAVENOUS
  Filled 2021-11-06: qty 100

## 2021-11-06 MED ORDER — FENTANYL 50 MCG/HR TD PT72
1.0000 | MEDICATED_PATCH | TRANSDERMAL | Status: DC
  Administered 2021-11-06 – 2021-12-03 (×7): 1 via TRANSDERMAL
  Filled 2021-11-06 (×8): qty 1

## 2021-11-06 MED ORDER — DARBEPOETIN ALFA 100 MCG/0.5ML IJ SOSY: 100.0000 ug | PREFILLED_SYRINGE | INTRAMUSCULAR | Status: DC

## 2021-11-06 NOTE — TOC Progression Note (Signed)
Transition of Care [facility]) - Progression Note    Patient Details  Name: [name] MRN: [id number] Date of Birth: May 24, 1960  Transition of Care [facility]) CM/SW [place], [place] Phone Number: 11/06/2021, 2:13 PM  Clinical Narrative:     CSW to follow up on patients dc plan with patients spouse tomorrow. CSW will follow up on status of  patients insurance.CSW will continue to follow and assist with patients dc planning needs.  Expected Discharge Plan: IP Rehab Facility Barriers to Discharge: Continued Medical Work up  Expected Discharge Plan and Services Expected Discharge Plan: [place]                                               Social Determinants of Health (SDOH) Interventions    Readmission Risk Interventions    09/20/2021   11:57 AM  Readmission Risk Prevention Plan  Post Dischage Appt Complete  Medication Screening Complete  Transportation Screening Complete

## 2021-11-06 NOTE — Progress Notes (Signed)
Subjective:   Rec full HD yesterday afternoon 1.7L UF, K is 4.4 this AM delirium overnight Decannulated yesterday Req on/off NE gtt  Objective Vital signs in last 24 hours: Vitals:   11/06/21 0759 11/06/21 0800 11/06/21 0900 11/06/21 1000  BP:  (!) 108/52 91/68 94/63   Pulse:  81 81 79  Resp:  (!) 24 (!) 25 20  Temp: 97.9 F (36.6 C)     TempSrc: Oral     SpO2:  100% 99% 99%  Weight:      Height:       Weight change: 5.6 kg  Intake/Output Summary (Last 24 hours) at 11/06/2021 1054 Last data filed at 11/06/2021 1047 Gross per 24 hour  Intake 2922.91 ml  Output 2210 ml  Net 712.91 ml    Assessment/Plan:     61 y.o. yo male  with HTN, HLD, DM, A-fib, stroke, CKD, CAD status post CABG on 8/3 presented with SOB, found cardiac tamponade, course complicated by cardiac arrest, use of ECMO, seen as a consultation for AKI    #Dialysis dependent Acute kidney injury on CKD IIIa.   Ischemic ATN due to cardiogenic shock/cardiac arrest.   Started CRRT on 8/28 for decreasing urine output and fluid/volume management.  -  stopped CRRT on 9/19-   Tolerating HD, has req some NE support, on THS schedule currently S/p L IJ TDC with IR 9/25 HD tomorrow. 2K, 2L UF BP permitting, tight heparin   #Cardiac tamponade/hemorrhagic pericarditis status post pericardial drain placement with subsequent ECMO placement.  decannulated  with placement of Impella.  S/p closure of the sternum and mediastinal washout on 9/2.   #Cardiogenic shock/cardiac arrest: Was on ECMO.  Pressors and midodrine per primary/AHF team.  Impella out on 9/7- remarkable improvement -     #Acute hypoxic respiratory failure:  improving  #Acute blood loss anemia: Monitor hemoglobin and transfuse as needed by ICU team.  Aranesp 100 weekly. 9/25 TSAT 10% and Ferritin 215, start IV Fe  [name]    Labs: Basic Metabolic Panel: Recent Labs  Lab 11/04/21 0421 11/05/21 0409 11/06/21 0859  NA 132* 127* 134*  K 4.9 6.4* 4.4   CL 90* 90* 93*  CO2 23 20* 24  GLUCOSE 133* 150* 172*  BUN 122* 141* 62*  CREATININE 5.11* 6.34* 4.10*  CALCIUM 9.3 9.1 8.7*  PHOS 3.0 5.7* 4.0    Liver Function Tests: Recent Labs  Lab 11/04/21 0421 11/05/21 0409 11/06/21 0859  ALBUMIN 2.7* 2.5* 2.5*    No results for input(s): "LIPASE", "AMYLASE" in the last 168 hours. No results for input(s): "AMMONIA" in the last 168 hours. CBC: Recent Labs  Lab 11/02/21 0439 11/03/21 0641 11/04/21 0421 11/05/21 1209 11/06/21 0859  WBC 24.7* 24.2* 25.7* 18.4* 17.9*  HGB 7.3* 7.0* 7.5* 6.1* 8.6*  HCT 24.0* 23.7* 24.9* 20.5* 27.9*  MCV 99.2 100.9* 98.0 99.5 95.5  PLT 445* 437* 432* 433* 359    Cardiac Enzymes: No results for input(s): "CKTOTAL", "CKMB", "CKMBINDEX", "TROPONINI" in the last 168 hours.  CBG: Recent Labs  Lab 11/05/21 1638 11/05/21 1950 11/05/21 2343 11/06/21 0346 11/06/21 0803  GLUCAP 170* 203* 171* 172* 141*     Iron Studies:  Recent Labs    11/04/21 0421  IRON 39*  TIBC 402  FERRITIN 215    Studies/Results: DG Chest Port 1 View  Result Date: 11/06/2021 CLINICAL DATA:  Cardiogenic shock.  Recent removal of tracheostomy EXAM: PORTABLE CHEST 1 VIEW COMPARISON:  11/02/2021 FINDINGS: Removal of  tracheostomy tube. New large-bore central venous line with tip in the distal SVC. Removal of RIGHT IJ catheter. Feeding tube extends the stomach. Stable cardiac silhouette. LEFT pleural effusion basilar atelectasis. Mild venous congestion. IMPRESSION: 1. New large-bore central venous line with tip in the distal SVC. 2. LEFT basilar atelectasis and effusion. Electronically Signed   By: Suzy Bouchard M.D.   On: 11/06/2021 08:19   IR Fluoro Guide CV Line Left  Result Date: 11/05/2021 INDICATION: 16 year old with acute kidney injury and needs a tunneled dialysis catheter. EXAM: FLUOROSCOPIC AND ULTRASOUND GUIDED PLACEMENT OF A TUNNELED DIALYSIS CATHETER Physician: Stephan Minister. Anselm Pancoast, MD MEDICATIONS: Ancef 2 g, Dilaudid  1 mg ANESTHESIA/SEDATION: The patient was continuously monitored during the procedure by the interventional radiology nurse under my direct supervision. FLUOROSCOPY TIME:  Radiation Exposure Index (as provided by the fluoroscopic device): 3.1 mGy Kerma COMPLICATIONS: None immediate. PROCEDURE: Informed consent was obtained for placement of a tunneled dialysis catheter. The patient was placed supine on the interventional table. Ultrasound confirmed a patent left internal jugular vein. Ultrasound image obtained for documentation. The left neck and chest was prepped and draped in a sterile fashion. Maximal barrier sterile technique was utilized including caps, mask, sterile gowns, sterile gloves, sterile drape, hand hygiene and skin antiseptic. The left neck was anesthetized with 1% lidocaine. A small incision was made with #11 blade scalpel. A 21 gauge needle directed into the left internal jugular vein with ultrasound guidance. A micropuncture dilator set was placed. A 23 cm tip to cuff Palindrome catheter was selected. The skin below the left clavicle was anesthetized and a small incision was made with an #11 blade scalpel. A subcutaneous tunnel was formed to the vein dermatotomy site. The catheter was brought through the tunnel. The vein dermatotomy site was dilated to accommodate a peel-away sheath. The catheter was placed through the peel-away sheath and directed into the central venous structures. The tip of the catheter was placed at superior cavoatrial junction with fluoroscopy. Fluoroscopic images were obtained for documentation. Both lumens were found to aspirate and flush well. The proper amount of heparin was flushed in both lumens. The vein dermatotomy site was closed using a single layer of absorbable suture and Dermabond. The catheter was secured to the skin using Prolene suture. IMPRESSION: Successful placement of a left jugular tunneled dialysis catheter using ultrasound and fluoroscopic guidance.  Electronically Signed   By: Markus Daft M.D.   On: 11/05/2021 08:23   IR US Guide Vasc Access Left  Result Date: 11/05/2021 INDICATION: 17 year old with acute kidney injury and needs a tunneled dialysis catheter. EXAM: FLUOROSCOPIC AND ULTRASOUND GUIDED PLACEMENT OF A TUNNELED DIALYSIS CATHETER Physician: Stephan Minister. Anselm Pancoast, MD MEDICATIONS: Ancef 2 g, Dilaudid 1 mg ANESTHESIA/SEDATION: The patient was continuously monitored during the procedure by the interventional radiology nurse under my direct supervision. FLUOROSCOPY TIME:  Radiation Exposure Index (as provided by the fluoroscopic device): 3.1 mGy Kerma COMPLICATIONS: None immediate. PROCEDURE: Informed consent was obtained for placement of a tunneled dialysis catheter. The patient was placed supine on the interventional table. Ultrasound confirmed a patent left internal jugular vein. Ultrasound image obtained for documentation. The left neck and chest was prepped and draped in a sterile fashion. Maximal barrier sterile technique was utilized including caps, mask, sterile gowns, sterile gloves, sterile drape, hand hygiene and skin antiseptic. The left neck was anesthetized with 1% lidocaine. A small incision was made with #11 blade scalpel. A 21 gauge needle directed into the left internal jugular vein with ultrasound  guidance. A micropuncture dilator set was placed. A 23 cm tip to cuff Palindrome catheter was selected. The skin below the left clavicle was anesthetized and a small incision was made with an #11 blade scalpel. A subcutaneous tunnel was formed to the vein dermatotomy site. The catheter was brought through the tunnel. The vein dermatotomy site was dilated to accommodate a peel-away sheath. The catheter was placed through the peel-away sheath and directed into the central venous structures. The tip of the catheter was placed at superior cavoatrial junction with fluoroscopy. Fluoroscopic images were obtained for documentation. Both lumens were found to  aspirate and flush well. The proper amount of heparin was flushed in both lumens. The vein dermatotomy site was closed using a single layer of absorbable suture and Dermabond. The catheter was secured to the skin using Prolene suture. IMPRESSION: Successful placement of a left jugular tunneled dialysis catheter using ultrasound and fluoroscopic guidance. Electronically Signed   By: Markus Daft M.D.   On: 11/05/2021 08:23   Medications: Infusions:  sodium chloride 10 mL/hr at 11/06/21 1047   sodium chloride Stopped (11/04/21 1021)   albumin human     albumin human 12.5 g (11/01/21 2248)   anticoagulant sodium citrate     dexmedetomidine (PRECEDEX) IV infusion Stopped (11/06/21 0644)   ferric gluconate (FERRLECIT) IVPB 125 mg (11/06/21 1047)   norepinephrine (LEVOPHED) Adult infusion Stopped (11/06/21 0631)    Scheduled Medications:  sodium chloride   Intravenous Once   ascorbic acid  250 mg Per Tube BID   aspirin  81 mg Per Tube Daily   Chlorhexidine Gluconate Cloth  6 each Topical Daily   collagenase   Topical Daily   darbepoetin (ARANESP) injection - NON-DIALYSIS  100 mcg Subcutaneous Q Mon-1800   feeding supplement (PROSource TF20)  60 mL Per Tube TID   feeding supplement (VITAL 1.5 CAL)  1,000 mL Per Tube Q24H   fentaNYL  1 patch Transdermal Q72H   fiber  1 packet Per Tube BID   folic acid  1 mg Per Tube Daily   Gerhardt's butt cream   Topical Daily   heparin injection (subcutaneous)  5,000 Units Subcutaneous Q8H   insulin aspart  0-20 Units Subcutaneous Q4H   insulin aspart  2 Units Subcutaneous Q4H   insulin glargine-yfgn  10 Units Subcutaneous QHS   midodrine  20 mg Per Tube TID WC   multivitamin  1 tablet Per Tube QHS   nutrition supplement (JUVEN)  1 packet Per Tube BID BM   mouth rinse  15 mL Mouth Rinse 4 times per day   pantoprazole  40 mg Per Tube BID   QUEtiapine  50 mg Per Tube QHS   sodium chloride flush  3 mL Intravenous Q12H   thiamine  100 mg Per Tube Daily     have reviewed scheduled and prn medications.  Physical Exam: General:  resting, nad Heart: RRR Lungs: mostly clear Abdomen: slightly distended Extremities: no edema Dialysis Access: L IJ TDC bandaged   11/06/2021,10:54 AM  LOS: 39 days

## 2021-11-06 NOTE — Progress Notes (Signed)
Physical Therapy Treatment Patient Details Name: [name] MRN: [id number] DOB: 17-Apr-1960 Today's Date: 11/06/2021   History of Present Illness Pt is a 61 y.o. male admitted 09/28/21 with SOB, bradycardia, hypotension. Workup for bilateral pleural effusion, pericardial effusion s/p VATS with pericardial window 8/19. Cardiac arrest with tamponade 8/20, intubated with bedside mediastinal exploration, ECMO via fem access. Pt with mediastinal hemorrhage s/p reexploration 8/21. To OR 8/28 for washout, ECMO decannulation, Impella placement. CRRT initiated 8/28. Brief PEA arrest 8/29 with DCCV to NSR.  S/p chest closure 9/2. S/p trach and impella removed 9/7. S/p wound vac removal 9/11. Course complicated by bouts of hypotension and bradycardia. Off CRRT 9/19. Trach decannulated 9/25. Tunned HD cath placement 9/25; iHD initiated. PMH includes CAD (s/p recent CABG 09/12/21), CKD2, DM, HTN, AKI, afib, CVA.   PT Comments    Pt progressing with mobility. Pt demonstrates improved static sitting balance once BLEs repositioned at EOB. Pt with c/o significant buttocks/sacral pain from wound limiting standing trials. Pt remains limited by generalized weakness (especially RUE), decreased activity tolerance, poor balance strategies/postural reactions, pain and impaired cognition. Continue to recommend SNF-level therapies to maximize functional mobility and independence prior to return home.    Recommendations for follow up therapy are one component of a multi-disciplinary discharge planning process, led by the attending physician.  Recommendations may be updated based on patient status, additional functional criteria and insurance authorization.  Follow Up Recommendations  Skilled nursing-short term rehab (<3 hours/day) Can patient physically be transported by private vehicle: No   Assistance Recommended at Discharge Frequent or constant Supervision/Assistance  Patient can return home with the following Two people  to help with walking and/or transfers;Two people to help with bathing/dressing/bathroom;Direct supervision/assist for medications management;Assistance with feeding;Assistance with cooking/housework;Direct supervision/assist for financial management;Assist for transportation   Equipment Recommendations   (defer to next venue)    Recommendations for Other Services       Precautions / Restrictions Precautions Precautions: Sternal;Fall;Other (comment) Precaution Comments: cortrak, flexiseal, painful sacral/buttocks wound Restrictions Other Position/Activity Restrictions: initial CABG 09/12/21     Mobility  Bed Mobility Overal bed mobility: Needs Assistance Bed Mobility: Supine to Sit     Supine to sit: Mod assist, +2 for physical assistance     General bed mobility comments: increased assist as pt leaning posteriorly due to pain with buttocks wound, heavy modA+2 for trunk elevation and scooting hips to EOB    Transfers     Transfers: Sit to/from Stand Sit to Stand: Mod assist, +2 safety/equipment, Max assist           General transfer comment: modA for trunk elevation from EOB into stedy frame, pt assisting minimally with BUEs on stedy frame, difficulty maintaining grasp with RUE though ability to place hand on stedy improved; additional stand from stedy seat with significant increased time as pt intiating correct UE placement with trunk extension (when cued) but then falling back forward onto stedy bar endorsing fatigue, pt doing this 4-5x despite significant cues for standing, ultimately able to stand from stedy seat with mod-maxA for trunk elevation and eccentric control to low chair height    Ambulation/Gait                   Stairs             Wheelchair Mobility    Modified Rankin (Stroke Patients Only)       Balance Overall balance assessment: Needs assistance Sitting-balance support: Feet supported, No upper extremity supported  Sitting  balance-Leahy Scale: Fair Sitting balance - Comments: required external assist for BLE placement to maintain balance; improved ability to maintain static sitting posture without external assist   Standing balance support: During functional activity, Bilateral upper extremity supported Standing balance-Leahy Scale: Zero Standing balance comment: reliant on UE support and external assist for static standing in stedy frame, bilateral knee instability and left lateral lean                            Cognition Arousal/Alertness: Awake/alert Behavior During Therapy: Flat affect Overall Cognitive Status: Impaired/Different from baseline Area of Impairment: Attention, Memory, Following commands, Safety/judgement, Awareness, Problem solving                   Current Attention Level: Sustained Memory: Decreased recall of precautions Following Commands: Follows one step commands with increased time, Follows one step commands consistently Safety/Judgement: Decreased awareness of deficits Awareness: Intellectual Problem Solving: Slow processing, Difficulty sequencing, Requires verbal cues General Comments: Increased time to follow commands with frequent cues esp for safety using stedy.        Exercises      General Comments General comments (skin integrity, edema, etc.): wound on buttocks, sitting on geomat in chair for pressure relief, leaning onto L pillow for additional pressure relief      Pertinent Vitals/Pain Pain Assessment Pain Assessment: Faces Faces Pain Scale: Hurts even more Pain Location: buttocks Pain Descriptors / Indicators: Discomfort, Sore, Grimacing, Guarding Pain Intervention(s): Monitored during session, Limited activity within patient's tolerance, Repositioned, RN gave pain meds during session    Home Living                          Prior Function            PT Goals (current goals can now be found in the care plan section) Progress  towards PT goals: Progressing toward goals (slowly)    Frequency    Min 3X/week      PT Plan Current plan remains appropriate    Co-evaluation              AM-PAC PT "6 Clicks" Mobility   Outcome Measure  Help needed turning from your back to your side while in a flat bed without using bedrails?: A Lot Help needed moving from lying on your back to sitting on the side of a flat bed without using bedrails?: A Lot Help needed moving to and from a bed to a chair (including a wheelchair)?: Total Help needed standing up from a chair using your arms (e.g., wheelchair or bedside chair)?: Total Help needed to walk in hospital room?: Total Help needed climbing 3-5 steps with a railing? : Total 6 Click Score: 8    End of Session Equipment Utilized During Treatment: Gait belt Activity Tolerance: Patient limited by pain Patient left: in chair;with call bell/phone within reach;with nursing/sitter in room Nurse Communication: Mobility status;Need for lift equipment PT Visit Diagnosis: Other abnormalities of gait and mobility (R26.89);Difficulty in walking, not elsewhere classified (R26.2);Muscle weakness (generalized) (M62.81)     Time: 2992-4268 PT Time Calculation (min) (ACUTE ONLY): 27 min  Charges:  $Therapeutic Activity: 23-37 mins                     Mabeline Caras, PT, DPT Acute Rehabilitation Services  Personal: [facility] Office: [place] 11/06/2021, 5:15 PM

## 2021-11-06 NOTE — Progress Notes (Addendum)
Patient ID: [name], male   DOB: 13-Mar-1960, 61 y.o.   MRN: [id number]   Advanced Heart Failure Rounding Note   Subjective:    - 8/19 Pericardial window - 8/20 Cardiac arrest with tamponade -> Emergent bedside washout - 09/29/21 VA Cannulation - 09/30/21 Return to OR for mediastinal hemorrhage - 10/01/21 Developed AF -> amio - 10/02/21 TEE EF 25-30%  - 10/04/21 OR for washout. C/b continued bleeding overnight - 10/07/21 Placement of Impella 5.5 with washout, VA ECMO decannulation. Hypotensive with development of severe RV dysfunction after ECMO off and pressors titrated up. Multiple units of blood products. - 10/08/21 Brief PEA arrest. AFL with RVR >> S/p DCCV to SR, back in AFL shortly after - 8/31 Give 1UPRBCs  - 9/2 OR for chest closure - 9/3 Hypotensive overnight w/ SBPs in 80s. Febrile, mTemp 100.8. CRRT paused. VP increased to 0.04.  - 9/4 s/p bronchoscopy w/ BAL by PCCM, Cx NGTD  - 9/5 vomiting w/ large volume NGT output + watery/foul diarrhea, Tube feeds held. No signs of ileus on KUB. C-diff negative   - 9/7 OR for Impella Extraction and percutaneous tracheostomy. 2 u RBCs. - 9/8 CVVH stopped and line removed for holiday - 9/9 CVVH restarted.  -9/13 Worsening leukocytosis and pressor requirements. Purulent drainage from arterial line. Started Daptomycin, Micafungin and Meropenem. - 9/17 Daptomycin + Micafungin stopped. Remains on Meropenum  -9/19 CRRT holiday.  -9/20 Trach downsized  -9/21 transitioned to iHD  - 9/25 Tunneled HD cath placed. Trach decannulation - 9/26 Hgb 6.1>>transfused [name]   Required precedex overnight to help w/ sleep. BPs were low, restarted on low dose NE but now off currently. SBPs upper 90s.   1.7L volume removal w/ iHD yesterday.   Only complaint is sacral wound pain.   C/w dysphagia. SLP following. Remains NPO. Getting TFs    Objective:     Vital Signs:   Temp:  [97.7 F (36.5 C)-99.2 F (37.3 C)] 97.9 F (36.6 C) (09/27 0759) Pulse  Rate:  [66-93] 81 (09/27 0700) Resp:  [14-33] 22 (09/27 0700) BP: (73-129)/(46-90) 99/55 (09/27 0700) SpO2:  [92 %-100 %] 97 % (09/27 0700) Weight:  [89.8 kg-92.9 kg] 91.4 kg (09/27 0500) Last BM Date : 11/05/21  Weight change: Filed Weights   11/05/21 1145 11/05/21 1800 11/06/21 0500  Weight: 92.9 kg 89.8 kg 91.4 kg    Intake/Output:   Intake/Output Summary (Last 24 hours) at 11/06/2021 0833 Last data filed at 11/06/2021 0700 Gross per 24 hour  Intake 2657.24 ml  Output 2210 ml  Net 447.24 ml   PHYSICAL EXAM: General:  chronically ill appearing. No respiratory difficulty. No distress  HEENT: normal + cor trak  Neck: supple. JVD not elevated.  + lt IJ THD cath Carotids 2+ bilat; no bruits. No lymphadenopathy or thyromegaly appreciated. Cor: PMI nondisplaced. Regular rate & rhythm. + sternal dressing  Lungs: clear  Abdomen: soft, nontender, nondistended. No hepatosplenomegaly. No bruits or masses. Good bowel sounds. Extremities: no cyanosis, clubbing, rash, no edema Neuro: alert & oriented x 3, cranial nerves grossly intact. moves all 4 extremities w/o difficulty. Affect pleasant.   Telemetry: NSR 80s   Labs: Basic Metabolic Panel: Recent Labs  Lab 11/01/21 0344 11/02/21 0439 11/03/21 0641 11/04/21 0421 11/05/21 0409  NA 136 131* 134* 132* 127*  K 3.8 3.5 4.1 4.9 6.4*  CL 94* 92* 93* 90* 90*  CO2 _0 20*  GLUCOSE 107* 219* 175* 133* 150*  BUN 65* 114*  77* 122* 141*  CREATININE 3.44* 5.53* 3.79* 5.11* 6.34*  CALCIUM 9.2 9.1 9.1 9.3 9.1  MG 2.0 2.4 2.0 2.2 2.3  PHOS 4.4 6.5* 2.7 3.0 5.7*    Liver Function Tests: Recent Labs  Lab 11/01/21 0344 11/02/21 0439 11/03/21 0641 11/04/21 0421 11/05/21 0409  ALBUMIN 2.6* 2.6* 2.7* 2.7* 2.5*   No results for input(s): "LIPASE", "AMYLASE" in the last 168 hours. No results for input(s): "AMMONIA" in the last 168 hours.  CBC: Recent Labs  Lab 11/01/21 0344 11/02/21 0439 11/03/21 0641 11/04/21 0421  11/05/21 1209  WBC 24.1* 24.7* 24.2* 25.7* 18.4*  HGB 7.5* 7.3* 7.0* 7.5* 6.1*  HCT 24.9* 24.0* 23.7* 24.9* 20.5*  MCV 98.4 99.2 100.9* 98.0 99.5  PLT 426* 445* 437* 432* 433*    Cardiac Enzymes: No results for input(s): "CKTOTAL", "CKMB", "CKMBINDEX", "TROPONINI" in the last 168 hours.   BNP: BNP (last 3 results) Recent Labs    12/24/20 0651 09/28/21 0405  BNP 412.0* 826.6*    ProBNP (last 3 results) No results for input(s): "PROBNP" in the last 8760 hours.    Other results:  Imaging: DG Chest Port 1 View  Result Date: 11/06/2021 CLINICAL DATA:  Cardiogenic shock.  Recent removal of tracheostomy EXAM: PORTABLE CHEST 1 VIEW COMPARISON:  11/02/2021 FINDINGS: Removal of tracheostomy tube. New large-bore central venous line with tip in the distal SVC. Removal of RIGHT IJ catheter. Feeding tube extends the stomach. Stable cardiac silhouette. LEFT pleural effusion basilar atelectasis. Mild venous congestion. IMPRESSION: 1. New large-bore central venous line with tip in the distal SVC. 2. LEFT basilar atelectasis and effusion. Electronically Signed   By: Suzy Bouchard M.D.   On: 11/06/2021 08:19   IR Fluoro Guide CV Line Left  Result Date: 11/05/2021 INDICATION: 11 year old with acute kidney injury and needs a tunneled dialysis catheter. EXAM: FLUOROSCOPIC AND ULTRASOUND GUIDED PLACEMENT OF A TUNNELED DIALYSIS CATHETER Physician: Stephan Minister. Anselm Pancoast, MD MEDICATIONS: Ancef 2 g, Dilaudid 1 mg ANESTHESIA/SEDATION: The patient was continuously monitored during the procedure by the interventional radiology nurse under my direct supervision. FLUOROSCOPY TIME:  Radiation Exposure Index (as provided by the fluoroscopic device): 3.1 mGy Kerma COMPLICATIONS: None immediate. PROCEDURE: Informed consent was obtained for placement of a tunneled dialysis catheter. The patient was placed supine on the interventional table. Ultrasound confirmed a patent left internal jugular vein. Ultrasound image obtained  for documentation. The left neck and chest was prepped and draped in a sterile fashion. Maximal barrier sterile technique was utilized including caps, mask, sterile gowns, sterile gloves, sterile drape, hand hygiene and skin antiseptic. The left neck was anesthetized with 1% lidocaine. A small incision was made with #11 blade scalpel. A 21 gauge needle directed into the left internal jugular vein with ultrasound guidance. A micropuncture dilator set was placed. A 23 cm tip to cuff Palindrome catheter was selected. The skin below the left clavicle was anesthetized and a small incision was made with an #11 blade scalpel. A subcutaneous tunnel was formed to the vein dermatotomy site. The catheter was brought through the tunnel. The vein dermatotomy site was dilated to accommodate a peel-away sheath. The catheter was placed through the peel-away sheath and directed into the central venous structures. The tip of the catheter was placed at superior cavoatrial junction with fluoroscopy. Fluoroscopic images were obtained for documentation. Both lumens were found to aspirate and flush well. The proper amount of heparin was flushed in both lumens. The vein dermatotomy site was closed using a single layer  of absorbable suture and Dermabond. The catheter was secured to the skin using Prolene suture. IMPRESSION: Successful placement of a left jugular tunneled dialysis catheter using ultrasound and fluoroscopic guidance. Electronically Signed   By: Markus Daft M.D.   On: 11/05/2021 08:23   IR US Guide Vasc Access Left  Result Date: 11/05/2021 INDICATION: 21 year old with acute kidney injury and needs a tunneled dialysis catheter. EXAM: FLUOROSCOPIC AND ULTRASOUND GUIDED PLACEMENT OF A TUNNELED DIALYSIS CATHETER Physician: Stephan Minister. Anselm Pancoast, MD MEDICATIONS: Ancef 2 g, Dilaudid 1 mg ANESTHESIA/SEDATION: The patient was continuously monitored during the procedure by the interventional radiology nurse under my direct supervision.  FLUOROSCOPY TIME:  Radiation Exposure Index (as provided by the fluoroscopic device): 3.1 mGy Kerma COMPLICATIONS: None immediate. PROCEDURE: Informed consent was obtained for placement of a tunneled dialysis catheter. The patient was placed supine on the interventional table. Ultrasound confirmed a patent left internal jugular vein. Ultrasound image obtained for documentation. The left neck and chest was prepped and draped in a sterile fashion. Maximal barrier sterile technique was utilized including caps, mask, sterile gowns, sterile gloves, sterile drape, hand hygiene and skin antiseptic. The left neck was anesthetized with 1% lidocaine. A small incision was made with #11 blade scalpel. A 21 gauge needle directed into the left internal jugular vein with ultrasound guidance. A micropuncture dilator set was placed. A 23 cm tip to cuff Palindrome catheter was selected. The skin below the left clavicle was anesthetized and a small incision was made with an #11 blade scalpel. A subcutaneous tunnel was formed to the vein dermatotomy site. The catheter was brought through the tunnel. The vein dermatotomy site was dilated to accommodate a peel-away sheath. The catheter was placed through the peel-away sheath and directed into the central venous structures. The tip of the catheter was placed at superior cavoatrial junction with fluoroscopy. Fluoroscopic images were obtained for documentation. Both lumens were found to aspirate and flush well. The proper amount of heparin was flushed in both lumens. The vein dermatotomy site was closed using a single layer of absorbable suture and Dermabond. The catheter was secured to the skin using Prolene suture. IMPRESSION: Successful placement of a left jugular tunneled dialysis catheter using ultrasound and fluoroscopic guidance. Electronically Signed   By: Markus Daft M.D.   On: 11/05/2021 08:23     Medications:     Scheduled Medications:  sodium chloride   Intravenous Once    ascorbic acid  250 mg Per Tube BID   aspirin  81 mg Per Tube Daily   Chlorhexidine Gluconate Cloth  6 each Topical Daily   collagenase   Topical Daily   darbepoetin (ARANESP) injection - NON-DIALYSIS  100 mcg Subcutaneous Q Mon-1800   feeding supplement (PROSource TF20)  60 mL Per Tube TID   feeding supplement (VITAL 1.5 CAL)  1,000 mL Per Tube Q24H   fentaNYL  1 patch Transdermal Q72H   fiber  1 packet Per Tube BID   folic acid  1 mg Per Tube Daily   Gerhardt's butt cream   Topical Daily   heparin injection (subcutaneous)  5,000 Units Subcutaneous Q8H   insulin aspart  0-20 Units Subcutaneous Q4H   insulin aspart  2 Units Subcutaneous Q4H   insulin glargine-yfgn  10 Units Subcutaneous QHS   midodrine  20 mg Per Tube TID WC   multivitamin  1 tablet Per Tube QHS   nutrition supplement (JUVEN)  1 packet Per Tube BID BM   mouth rinse  15 mL  Mouth Rinse 4 times per day   pantoprazole  40 mg Per Tube BID   QUEtiapine  50 mg Per Tube QHS   sodium chloride flush  3 mL Intravenous Q12H   thiamine  100 mg Per Tube Daily    Infusions:  sodium chloride 10 mL/hr at 11/06/21 0700   sodium chloride Stopped (11/04/21 1021)   albumin human     albumin human 12.5 g (11/01/21 2248)   anticoagulant sodium citrate     dexmedetomidine (PRECEDEX) IV infusion Stopped (11/06/21 0644)   ferric gluconate (FERRLECIT) IVPB Stopped (11/05/21 1023)   norepinephrine (LEVOPHED) Adult infusion Stopped (11/06/21 0631)    PRN Medications: sodium chloride, acetaminophen (TYLENOL) oral liquid 160 mg/5 mL, albumin human, anticoagulant sodium citrate, camphor-menthol, clonazePAM, dextrose, diphenhydrAMINE, heparin, heparin, HYDROmorphone (DILAUDID) injection, ipratropium-albuterol, ondansetron (ZOFRAN) IV, mouth rinse, [EXPIRED] oxyCODONE **FOLLOWED BY** oxyCODONE, polyethylene glycol, polyvinyl alcohol, sodium chloride flush, white petrolatum   Assessment/Plan:    1. Shock - mixed cardiogenic/hemorrhagic -> VA  ECMO -> decannulated on 8/28 to Impella 5.5 - Echo 08/28: Underfilled LV, EF 55-60% with severe LVH and near normal RV systsystolic function.  - Impella extracted 9/7 -Bedside echo 09/13 - LV function preserved, RV mildly reduced. Small clot in posterior pericardium but no hemodynamic effect - Concern for worsening sepsis with leukocytosis and increasing pressor requirements on 09/13. Purulent drainage from Aline, replaced. - BC X 2 NGTD, tracheal aspirate pending. Procalcitonin 1.63.  - Given prolonged abx course earlier in stay, addition of Daptomycin, Micafungin and meropenem recommended per ID.  - Plan to complete total of 14 days of Micafungin (last day is today)   - off NE. On midodrine 20 mg tid.    2. Cardiac arrest (PEA/bradycardic) - 8/19 in setting of tamponade - PEA arrest again on 08/28  3. Cardiac tamponade with emergent bedside sternotomy  - Diffuse epicardial bleeding with post-op Dresslers - return to OR 8/21 and 8/24 for washouts.  - Washout 8/28 in OR. Multiple units of blood products in OR.   - Chest closed on 9/2 - Hgb 8.1>7.6>8.2> 8>7.5>6.1>2uRBc transfusion>>CBC pending   4. Acute hypoxemic respiratory failure - Off ECMO.  -  Perc Trach placed 9/7  - s/p bronchoscopy w/ BAL 9/4.  - tracheal aspirate 09/13 growing few GPC and rare yeast  - Started back on IV abx as above d/t concern for sepsis.  - Trach downsized - Trach de cannulated 9/25  - Stable on RA   5. AKI due to ATN - CRRT started 08/28.  - Remains on anuric - 9/19 given CRRT holiday.  - Transitioned to [facility] 9/21. 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 Now off colchicine   7. 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 Continue aspirin 81 mg daily  8. DM2 - continue SSI   9. PAF/AFL - Tolerates poorly.  - Recurrent AFL. S/p DCCV  to SR 08/29.  - Off amio d/t bradycardia - Not on [facility] for now d/t bleeding, DVT dose  heparin.  - In NSR   10. ID - Completed vancomycin/meropenem with open chest.  - WBC 17.2 >23> 26>23>>24>>25>>18K. ? Possible aspiration with vomiting.  - now off meropenum    11. Neuro Oriented x3  12. FEN - TFs ongoing  - SLP following   13. Hyperkalemia - BMP pending   14. Unstageable Pressure Ulcer, Buttock  - WOC following.   15.  Abdominal Pain - Resolved.    Lyda Jester, PA-C  8:33 AM   Patient seen and examined with the above-signed Advanced Practice Provider and/or Housestaff. I personally reviewed laboratory data, imaging studies and relevant notes. I independently examined the patient and formulated the important aspects of the plan. I have edited the note to reflect any of my changes or salient points. I have personally discussed the plan with the patient and/or family.  Tolerated iHD yesterday. Was back on NE transiently but now off again. SBP 90s. Still with sacral pain. Failed swallow study. Remains on TFs. WBC coming down.   General:  Weak appearing. No resp difficulty HEENT: normal Neck: supple. no JVD. Trach dressing ok Carotids 2+ bilat; no bruits. No lymphadenopathy or thryomegaly appreciated. Cor: PMI nondisplaced. Regular rate & rhythm. No rubs, gallops or murmurs. + tunneled HD cath Lungs: clear Abdomen: soft, nontender, nondistended. No hepatosplenomegaly. No bruits or masses. Good bowel sounds. Extremities: no cyanosis, clubbing, rash, edema Neuro: alert & orientedx3, cranial nerves grossly intact. RUE weak Affect pleasant  Remains tenuous and weak. Now tolerating iHD but BP soft. Continue midodrine. WBC trending down. Continue PT/OT.   Suspect RUE brachial plexus injury from Impella d/w OT.  Glori Bickers, MD  12:14 PM'

## 2021-11-06 NOTE — Progress Notes (Signed)
20 Days Post-Op Procedure(s) (LRB): REMOVAL OF IMPELLA LEFT VENTRICULAR ASSIST DEVICE (Right) TRANSESOPHAGEAL ECHOCARDIOGRAM (TEE) (N/A) PERCUTANEOUS TRACHEOSTOMY USING SHILEY FLEXIBLE 8 mm CUFFED TRACH. (N/A) Subjective: Patient relates he is very fatigued from his HD session yesterday.  Apparently he needed some norepinephrine support for blood pressure during the HD session.  This is now off.  Breathing comfortably with trach site covered Chest x-ray today shows good lung volumes, no pleural effusion Lower sternal wound has been packed earlier this a.m. with clean dressing  CIR coordinator has evaluated the patient and has recommended against this option and has recommended skilled nursing facility  Objective: Vital signs in last 24 hours: Temp:  [97.7 F (36.5 C)-99.2 F (37.3 C)] 97.9 F (36.6 C) (09/27 0759) Pulse Rate:  [71-93] 79 (09/27 1000) Cardiac Rhythm: Normal sinus rhythm (09/27 0800) Resp:  [14-31] 20 (09/27 1000) BP: (73-129)/(46-90) 94/63 (09/27 1000) SpO2:  [92 %-100 %] 99 % (09/27 1000) Weight:  [89.8 kg-92.9 kg] 91.4 kg (09/27 0500)  Hemodynamic parameters for last 24 hours:  Sinus rhythm  Intake/Output from previous day: 09/26 0701 - 09/27 0700 In: 2787.2 [I.V.:431.6; Blood:640; NG/GT:1500; IV Piggyback:215.7] Out: 2240 [Stool:530] Intake/Output this shift: Total I/O In: 210 [I.V.:30; NG/GT:180] Out: -   Exam Alert and responds appropriately Lungs clear Abdomen soft Upper sternal incision with intact skin lower sternal incision packed into subcutaneous tissue.  Lab Results: Recent Labs    11/05/21 1209 11/06/21 0859  WBC 18.4* 17.9*  HGB 6.1* 8.6*  HCT 20.5* 27.9*  PLT 433* 359   BMET:  Recent Labs    11/05/21 0409 11/06/21 0859  NA 127* 134*  K 6.4* 4.4  CL 90* 93*  CO2 20* 24  GLUCOSE 150* 172*  BUN 141* 62*  CREATININE 6.34* 4.10*  CALCIUM 9.1 8.7*    PT/INR: No results for input(s): "LABPROT", "INR" in the last 72  hours. ABG    Component Value Date/Time   PHART 7.301 (L) 10/22/2021 2354   HCO3 19.3 (L) 10/22/2021 2354   TCO2 20 (L) 10/22/2021 2354   ACIDBASEDEF 7.0 (H) 10/22/2021 2354   O2SAT 71.7 11/02/2021 0439   CBG (last 3)  Recent Labs    11/05/21 2343 11/06/21 0346 11/06/21 0803  GLUCAP 171* 172* 141*    Assessment/Plan: S/P Procedure(s) (LRB): REMOVAL OF IMPELLA LEFT VENTRICULAR ASSIST DEVICE (Right) TRANSESOPHAGEAL ECHOCARDIOGRAM (TEE) (N/A) PERCUTANEOUS TRACHEOSTOMY USING SHILEY FLEXIBLE 8 mm CUFFED TRACH. (N/A) Continue current care plan with HD session scheduled tomorrow Swallow study later this week Continue with PT, still too weak to transfer patient out of ICU   LOS: 39 days    Dahlia Byes 11/06/2021

## 2021-11-06 NOTE — Progress Notes (Signed)
     [address]       ,[place] [address]             [phone]       EVENING ROUNDS Stable Not on support today For dialysis tomorrow

## 2021-11-06 NOTE — Progress Notes (Signed)
NAME:  [name], MRN:  [id number], DOB:  [birth year], LOS: 48 ADMISSION DATE:  09/28/2021, CONSULTATION DATE:  09/29/2021 REFERRING MD:  Kipp Brood - TCTS CHIEF COMPLAINT: Dyspnea   History of Present Illness:  61 year old man who underwent CABG on 8/3, readmitted on 8/19 in setting of dyspnea with a pericardial effusion requiring pericardial window via bedside sternotomy. Also with bilateral pleural effusions. Sustained PEA arrest on 8/20, briefly required VA ECMO which was discontinued on 8/28, Impella placed 8/28, started on CRRT.  Hypoxemia, repeat cardiac arrest on 8/30. Underwent mediastinal washout/sternotomy closure 9/2. Impella removed and tracheostomy completed 9/7.  Pertinent Medical History:  CAD s/p CABG 8/3 DM2 Diverticulosis GERD Hyperlipidemia Hypertension History of stroke Tobacco Abuse  Significant Hospital Events: Including procedures, antibiotic start and stop dates in addition to other pertinent events   8/19 Admitted, s/p pericardial window 8/20 PEA cardiac arrest, left pigtail chest tube placement, PEA cardiac arrest due to tamponade, bedside sternotomy with pericardial drains placed. 8/21 Return to OR for overnight bleeding.  8/22 Minimal chest tube output.  Atrial fibrillation, controled with amiodarone.  8/22 TEE showed EF 35% at baseline, which improved significantly with decreasing ECMO flow.  8/23 Tolerated diuresis  8/24 Mediastinal washout, removal of hematoma 8/25 Ongoing bleeding issues from posterior mediastinum 8/28 Decannulated and off ECMO, 5.5 Impella placed, L femoral HD cath placed and started on CRRT 8/30 Hypoxia and hypotension followed by brief arrest, gentle L lateral chest compressions performed and ROSC in roughly 1 minute. 8/31 Remains deeply sedated on ventilator with open chest.  No acute events overnight. 9/1 No acute events overnight, remains on ventilator deep sedation.  Tolerating aggressive volume removal per CRRT 9/2 Sternotomy  closure 9/3 Wean versed 9/4 FOB, low grade temps, notable thick mucus/frequent suctioning 9/5 Vomiting, ?mucus plugging. PSV 5-6 hours on 10/5 9/6 Impella down to p3 9/7 Impella removal and trach. PRBC and DDAVP in OR  9/8 Low dose epi, weaned on 8/8 9/9 RIJ HD catheter placed 9/11 Started on ATC at 28% and tolerating well 9/12 Remains on 28% ATC 9/13 SB/Junction rhythm with associated hypotension overnight requiring transient pressor escalation. Pressor requirement down in AM. WBC count 23 (18), Co-ox 36%. Resp/Blood Cx. 9/14 Mild bradycardia/relative hypotension with Precedex. Remains on Epi 41mcg, Vaso 0.03. Resp Cx with few gram+ cocci (clusters), yeast. BCx NGTD. 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 Co-ox 48%. CRRT stopped. 9/15 WBC continues to downtrend. Hgb 7.6. Net +1.49L/24H. CRRT resumed. Co-ox 51%. NE started to help with Epi wean. 9/16, Co. oximetry improved, WBC trending down, weaning pressors 9/19 on trach collar for 96 hours.  Weaning off vasopressors.  Trial off CRRT today.  Episode of overnight emesis. 9/20 more awake. CRRT off 9/21 cuffless tracheostomy. Do well with PMV 9/22 failed swallow evaluation 9/25 Tolerating trach capping on RA. Up to chair. Trach decannulation today. Tunneled dialysis catheter placement with IR. 9/26 Doing well post-trach decannulation. Able to phonate with light pressure. S/p TDC with IR, some catheter flow issues overnight. Reattempting HD today.  Interim History / Subjective:  Lots of pain at sacral wound today.  Objective:  Blood pressure (!) 99/55, pulse 81, temperature 97.9 F (36.6 C), temperature source Oral, resp. rate (!) 22, height 5\' 7"  (1.702 m), weight 91.4 kg, SpO2 97 %.        Intake/Output Summary (Last 24 hours) at 11/06/2021 0817 Last data filed at 11/06/2021 0700 Gross per 24 hour  Intake 2657.24 ml  Output 2210  ml  Net 447.24 ml    Filed Weights   11/05/21 1145 11/05/21 1800  11/06/21 0500  Weight: 92.9 kg 89.8 kg 91.4 kg   Physical Examination: No distress Profoundly weak Trach dressing in place Diffuse anasarca stable AOx3  Assessment & Plan:   Acute metabolic encephalopathy.  C/w uncontrolled pain and ICU delirium CAD s/p CABG x 3, Hemorrhagic pericarditis with tamponade PEA arrest due to tamponade, s/p emergent sternotomy Cardiogenic & hemorrhagic shock, s/p VA ECMO (decannulated 8/29), s/p Impella (removed 9/7); off all vasopressors. EF now normal. 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   Candee Furbish, MD [facility] 11/06/21 8:17 AM  Please see [email or web address] for pager details.  From 7A-7P if no response, please call [phone] After hours, please call ELink [phone]

## 2021-11-06 NOTE — Progress Notes (Signed)
eLink Physician-Brief Progress Note Patient Name: [name] DOB: 09/03/1960 MRN: [id number]   Date of Service  11/06/2021  HPI/Events of Note  61-y/o M, s/p CABG on 8/3, readmitted on 8/19 c/ pericardial effusion requiring pericardial window via bedside sternotomy. + bilateral pleural effusions. Sustained PEA arrest on 8/20, briefly on [place] ECMO , Impella placed 8/28, started on CRRT.  Hypoxemia, repeat cardiac arrest on 8/30. Underwent mediastinal washout/sternotomy closure 9/2. Impella removed and tracheostomy completed 9/7  9/26 Doing well post-trach decannulation. Got  HD today. Otherwise has been off Levophed and pressors  E-link called for agitation despite Klonopin, Seroquel, Benadryl, Melatonin     eICU Interventions  -Metabolic encephalopathy.   -attempting to Limit sedating medications as able - Continue current meds, Seroquel -low dose Precedex for now, attempt to wean off , add Gabapentin  -off all vasopressors. EF now normal - Goal MAP > 60 - Levophed titrated to goal MAP, off as of 9/25 -if BP an issue can try IV Zyprexa         [name] N [name] 11/06/2021, 1:22 AM

## 2021-11-07 LAB — RENAL FUNCTION PANEL
Albumin: 2.4 g/dL — ABNORMAL LOW (ref 3.5–5.0)
Anion gap: 18 — ABNORMAL HIGH (ref 5–15)
BUN: 91 mg/dL — ABNORMAL HIGH (ref 8–23)
CO2: 24 mmol/L (ref 22–32)
Calcium: 9.2 mg/dL (ref 8.9–10.3)
Chloride: 93 mmol/L — ABNORMAL LOW (ref 98–111)
Creatinine, Ser: 5.2 mg/dL — ABNORMAL HIGH (ref 0.61–1.24)
GFR, Estimated: 12 mL/min — ABNORMAL LOW (ref >60)
Glucose, Bld: 168 mg/dL — ABNORMAL HIGH (ref 70–99)
Phosphorus: 3.7 mg/dL (ref 2.5–4.6)
Potassium: 4.2 mmol/L (ref 3.5–5.1)
Sodium: 135 mmol/L (ref 135–145)

## 2021-11-07 LAB — CBC
HCT: 28.1 % — ABNORMAL LOW (ref 39.0–52.0)
Hemoglobin: 8.8 g/dL — ABNORMAL LOW (ref 13.0–17.0)
MCH: 29.9 pg (ref 26.0–34.0)
MCHC: 31.3 g/dL (ref 30.0–36.0)
MCV: 95.6 fL (ref 80.0–100.0)
Platelets: 392 10*3/uL (ref 150–400)
RBC: 2.94 MIL/uL — ABNORMAL LOW (ref 4.22–5.81)
RDW: 21.5 % — ABNORMAL HIGH (ref 11.5–15.5)
WBC: 18.8 10*3/uL — ABNORMAL HIGH (ref 4.0–10.5)
nRBC: 1 % — ABNORMAL HIGH (ref 0.0–0.2)

## 2021-11-07 LAB — GLUCOSE, CAPILLARY
Glucose-Capillary: 100 mg/dL — ABNORMAL HIGH (ref 70–99)
Glucose-Capillary: 143 mg/dL — ABNORMAL HIGH (ref 70–99)
Glucose-Capillary: 156 mg/dL — ABNORMAL HIGH (ref 70–99)
Glucose-Capillary: 157 mg/dL — ABNORMAL HIGH (ref 70–99)
Glucose-Capillary: 162 mg/dL — ABNORMAL HIGH (ref 70–99)
Glucose-Capillary: 171 mg/dL — ABNORMAL HIGH (ref 70–99)

## 2021-11-07 LAB — MAGNESIUM: Magnesium: 1.9 mg/dL (ref 1.7–2.4)

## 2021-11-07 MED ORDER — HEPARIN SODIUM (PORCINE) 1000 UNIT/ML IJ SOLN
INTRAMUSCULAR | Status: AC
  Administered 2021-11-07: 1900 [IU] via INTRAVENOUS_CENTRAL
  Filled 2021-11-07: qty 5

## 2021-11-07 NOTE — Progress Notes (Signed)
Subjective:   Seen in room Hasn't req NE in past 24h For HD today No UOP  Objective Vital signs in last 24 hours: Vitals:   11/07/21 0605 11/07/21 0800 11/07/21 0900 11/07/21 1010  BP: 116/69 (!) 102/57 116/60 (!) 98/58  Pulse: 93 86 85 82  Resp: (!) 22 (!) 27 16 20   Temp:      TempSrc:      SpO2: 97% 97% 99% 96%  Weight:      Height:       Weight change: -9.393 kg  Intake/Output Summary (Last 24 hours) at 11/07/2021 1020 Last data filed at 11/07/2021 0500 Gross per 24 hour  Intake 1740 ml  Output 200 ml  Net 1540 ml    Assessment/Plan:     61 y.o. yo male  with HTN, HLD, DM, A-fib, stroke, CKD, CAD status post CABG on 8/3 presented with SOB, found cardiac tamponade, course complicated by cardiac arrest, use of ECMO, seen as a consultation for AKI    #Anuric Dialysis dependent Acute kidney injury on CKD IIIa.   Ischemic ATN due to cardiogenic shock/cardiac arrest.   Started CRRT on 8/28 for decreasing urine output and fluid/volume management.  -  stopped CRRT on 9/19-   Tolerating HD, has req some NE + high dose midodrine support, on THS schedule currently S/p L IJ TDC with IR 9/25 HD today. 2K, 2L UF BP permitting, tight heparin   #Cardiac tamponade/hemorrhagic pericarditis status post pericardial drain placement with subsequent ECMO placement.  decannulated  with placement of Impella.  S/p closure of the sternum and mediastinal washout on 9/2.   #Cardiogenic shock/cardiac arrest: Was on ECMO.  Pressors and midodrine per primary/AHF team.  Impella out on 9/7- remarkable improvement -     #Acute hypoxic respiratory failure:  improving  #Acute blood loss anemia: Monitor hemoglobin and transfuse as needed by ICU team.  Aranesp 100 weekly. 9/25 TSAT 10% and Ferritin 215, rec IV Fe  Austen Wygant Modesta Messing    Labs: Basic Metabolic Panel: Recent Labs  Lab 11/05/21 0409 11/06/21 0859 11/07/21 0229  NA 127* 134* 135  K 6.4* 4.4 4.2  CL 90* 93* 93*  CO2 20* 24 24  GLUCOSE  150* 172* 168*  BUN 141* 62* 91*  CREATININE 6.34* 4.10* 5.20*  CALCIUM 9.1 8.7* 9.2  PHOS 5.7* 4.0 3.7    Liver Function Tests: Recent Labs  Lab 11/05/21 0409 11/06/21 0859 11/07/21 0229  ALBUMIN 2.5* 2.5* 2.4*    No results for input(s): "LIPASE", "AMYLASE" in the last 168 hours. No results for input(s): "AMMONIA" in the last 168 hours. CBC: Recent Labs  Lab 11/03/21 0641 11/04/21 0421 11/05/21 1209 11/06/21 0859 11/07/21 0229  WBC 24.2* 25.7* 18.4* 17.9* 18.8*  HGB 7.0* 7.5* 6.1* 8.6* 8.8*  HCT 23.7* 24.9* 20.5* 27.9* 28.1*  MCV 100.9* 98.0 99.5 95.5 95.6  PLT 437* 432* 433* 359 392    Cardiac Enzymes: No results for input(s): "CKTOTAL", "CKMB", "CKMBINDEX", "TROPONINI" in the last 168 hours.  CBG: Recent Labs  Lab 11/06/21 1619 11/06/21 2035 11/07/21 0015 11/07/21 0456 11/07/21 0809  GLUCAP 140* 160* 157* 171* 143*     Iron Studies: No results for input(s): "IRON", "TIBC", "TRANSFERRIN", "FERRITIN" in the last 72 hours.  Studies/Results: DG Chest Port 1 View  Result Date: 11/06/2021 CLINICAL DATA:  Cardiogenic shock.  Recent removal of tracheostomy EXAM: PORTABLE CHEST 1 VIEW COMPARISON:  11/02/2021 FINDINGS: Removal of tracheostomy tube. New large-bore central venous line with tip  in the distal SVC. Removal of RIGHT IJ catheter. Feeding tube extends the stomach. Stable cardiac silhouette. LEFT pleural effusion basilar atelectasis. Mild venous congestion. IMPRESSION: 1. New large-bore central venous line with tip in the distal SVC. 2. LEFT basilar atelectasis and effusion. Electronically Signed   By: Suzy Bouchard M.D.   On: 11/06/2021 08:19   Medications: Infusions:  sodium chloride 10 mL/hr at 11/07/21 0500   sodium chloride Stopped (11/04/21 1021)   albumin human     albumin human 12.5 g (11/01/21 2248)   anticoagulant sodium citrate     dexmedetomidine (PRECEDEX) IV infusion Stopped (11/06/21 0644)   ferric gluconate (FERRLECIT) IVPB Stopped  (11/06/21 1147)   norepinephrine (LEVOPHED) Adult infusion Stopped (11/06/21 0631)    Scheduled Medications:  sodium chloride   Intravenous Once   ascorbic acid  250 mg Per Tube BID   aspirin  81 mg Per Tube Daily   Chlorhexidine Gluconate Cloth  6 each Topical Daily   collagenase   Topical Daily   [START ON 11/12/2021] darbepoetin (ARANESP) injection - DIALYSIS  100 mcg Intravenous Q Tue-HD   feeding supplement (PROSource TF20)  60 mL Per Tube TID   feeding supplement (VITAL 1.5 CAL)  1,000 mL Per Tube Q24H   fentaNYL  1 patch Transdermal Q72H   fiber  1 packet Per Tube BID   folic acid  1 mg Per Tube Daily   Gerhardt's butt cream   Topical Daily   heparin injection (subcutaneous)  5,000 Units Subcutaneous Q8H   insulin aspart  0-20 Units Subcutaneous Q4H   insulin aspart  2 Units Subcutaneous Q4H   insulin glargine-yfgn  10 Units Subcutaneous QHS   midodrine  20 mg Per Tube TID WC   multivitamin  1 tablet Per Tube QHS   nutrition supplement (JUVEN)  1 packet Per Tube BID BM   mouth rinse  15 mL Mouth Rinse 4 times per day   pantoprazole  40 mg Per Tube BID   QUEtiapine  50 mg Per Tube QHS   sodium chloride flush  3 mL Intravenous Q12H   thiamine  100 mg Per Tube Daily    have reviewed scheduled and prn medications.  Physical Exam: General:  resting, nad Heart: RRR Lungs: mostly clear Abdomen: slightly distended Extremities: no edema Dialysis Access: L IJ TDC bandaged   11/07/2021,10:20 AM  LOS: 40 days

## 2021-11-07 NOTE — Progress Notes (Signed)
Occupational Therapy Treatment Patient Details Name: [name] MRN: [id number] DOB: [birth year] Today's Date: 11/07/2021   History of present illness Pt is a 61 y.o. male admitted 09/28/21 with SOB, bradycardia, hypotension. Workup for bilateral pleural effusion, pericardial effusion s/p VATS with pericardial window 8/19. Cardiac arrest with tamponade 8/20, intubated with bedside mediastinal exploration, ECMO via fem access. Pt with mediastinal hemorrhage s/p reexploration 8/21. To OR 8/28 for washout, ECMO decannulation, Impella placement. CRRT initiated 8/28. Brief PEA arrest 8/29 with DCCV to NSR.  S/p chest closure 9/2. S/p trach and impella removed 9/7. S/p wound vac removal 9/11. Course complicated by bouts of hypotension and bradycardia. Off CRRT 9/19. Trach decannulated 9/25. Tunned HD cath placement 9/25; iHD initiated. PMH includes CAD (s/p recent CABG 09/12/21), CKD2, DM, HTN, AKI, afib, CVA.   OT comments  Session limited by pain. Increased for all movement due to pain although premedicated. Assisted with bed mobility during sacral dressing change. Required mod A +2 for rolling.Increased ability to complete grooming tasks @ bed level- began education on compensatory strategies due to weak R hand.  Improved cognition noted, increasing ability to participate with OT. Continue to recommend rehab at [facility]. Acute OT to follow.    Recommendations for follow up therapy are one component of a multi-disciplinary discharge planning process, led by the attending physician.  Recommendations may be updated based on patient status, additional functional criteria and insurance authorization.    Follow Up Recommendations  Skilled nursing-short term rehab (<3 hours/day)    Assistance Recommended at Discharge Frequent or constant Supervision/Assistance  Patient can return home with the following  Two people to help with walking and/or transfers;Two people to help with bathing/dressing/bathroom;Direct  supervision/assist for medications management;Direct supervision/assist for financial management;Assistance with feeding;Assistance with cooking/housework;Assist for transportation;Help with stairs or ramp for entrance   Equipment Recommendations  Other (comment) (TBA)    Recommendations for Other Services      Precautions / Restrictions Precautions Precautions: Sternal;Fall;Other (comment) Precaution Comments: cortrak, flexiseal, painful sacral/buttocks wound Restrictions Other Position/Activity Restrictions: initial CABG 09/12/21       Mobility Bed Mobility     Rolling: Mod assist, +2 for physical assistance (rolling side to side; working in L sidelying due to pain)         General bed mobility comments: Assisted with bed pad change; MD/Nsg changing sacral dressing during session    Transfers                   General transfer comment: Not attempted this date     Balance                                           ADL either performed or assessed with clinical judgement   ADL Overall ADL's : Needs assistance/impaired Eating/Feeding: NPO   Grooming: Minimal assistance Grooming Details (indicate cue type and reason): oral care with [place] Upper Body Bathing: Maximal assistance                   Toileting- Clothing Manipulation and Hygiene: Total assistance       Functional mobility during ADLs: Moderate assistance;+2 for physical assistance      Extremity/Trunk Assessment Upper Extremity Assessment RUE Deficits / Details: generalzied weakness; Small improvement in digit movement and extension however unable to use functionally   Lower Extremity Assessment Lower Extremity Assessment: Defer to  PT evaluation        Vision       Perception     Praxis      Cognition Arousal/Alertness: Awake/alert Behavior During Therapy: Anxious Overall Cognitive Status: Impaired/Different from baseline Area of Impairment: Attention,  Safety/judgement, Awareness, Problem solving                   Current Attention Level: Selective   Following Commands: Follows one step commands consistently Safety/Judgement: Decreased awareness of deficits Awareness: Emergent Problem Solving: Slow processing, Decreased initiation General Comments: improved cogntion; definitely impacted by pain        Exercises Exercises: Other exercises General Exercises - Upper Extremity Shoulder Flexion: AAROM, Both, 10 reps, Standing, AROM Elbow Flexion: AAROM, Both, Sidelying Elbow Extension: AAROM, Both, Sidelying Wrist Flexion: AROM, Both, Sidelying Wrist Extension: AAROM, Both, Sidelying Digit Composite Flexion: Right, PROM, AAROM, 10 reps Composite Extension: PROM, AAROM, Right Hand Exercises Forearm Supination: AROM, Right, 5 reps Forearm Pronation: PROM, AAROM, Strengthening, 5 reps Wrist Flexion: AAROM, PROM, Right, 5 reps Wrist Extension: Right, AROM, 5 reps Wrist Ulnar Deviation: PROM, Right, 5 reps Wrist Radial Deviation: PROM, Strengthening, 5 reps Digit Composite Flexion: PROM, Right, 10 reps Composite Extension: PROM, AAROM, Right, 10 reps Other Exercises Other Exercises: L sidelying movign legs off bed adn back on - able to tolerate Other Exercises: in L sidelying, B hips/knees flexed - working on hip/knee extension adn minmaly trunk rotational movemetns - began reaching toward R knee with RUE to simulate LB bathing    Shoulder Instructions       General Comments      Pertinent Vitals/ Pain       Pain Assessment Pain Assessment: Faces Faces Pain Scale: Hurts worst Pain Location: buttocks Pain Descriptors / Indicators: Discomfort, Sore, Grimacing, Guarding, Moaning Pain Intervention(s): RN gave pain meds during session, Premedicated before session  Home Living                                          Prior Functioning/Environment              Frequency  Min 2X/week         Progress Toward Goals  OT Goals(current goals can now be found in the care plan section)  Progress towards OT goals: Progressing toward goals  Acute Rehab OT Goals Patient Stated Goal: to not be in so much pain and get strgoner OT Goal Formulation: With patient Time For Goal Achievement: 11/18/21 Potential to Achieve Goals: Good ADL Goals Pt Will Perform Grooming: with mod assist;bed level Pt Will Perform Upper Body Bathing: with mod assist;bed level Pt/caregiver will Perform Home Exercise Program: Increased strength;Increased ROM;Both right and left upper extremity;With minimal assist Additional ADL Goal #1: Pt will demosntrate sustained attention for grooming task in nondistracting environment Additional ADL Goal #2: Pt will complete bed mobility with mod A in preparation for ADL tasks Additional ADL Goal #3: Pt will grasp/release objects using functional pinch pattern with necessary AE/ splinting to increase functional use R hand  Plan Discharge plan remains appropriate;Frequency remains appropriate    Co-evaluation                 AM-PAC OT "6 Clicks" Daily Activity     Outcome Measure   Help from another person eating meals?: Total Help from another person taking care of personal grooming?: A Little Help  from another person toileting, which includes using toliet, bedpan, or urinal?: Total Help from another person bathing (including washing, rinsing, drying)?: A Lot Help from another person to put on and taking off regular upper body clothing?: Total Help from another person to put on and taking off regular lower body clothing?: Total 6 Click Score: 9    End of Session    OT Visit Diagnosis: Unsteadiness on feet (R26.81);Other abnormalities of gait and mobility (R26.89);Muscle weakness (generalized) (M62.81)   Activity Tolerance Patient limited by pain   Patient Left in bed;with call bell/phone within reach;with nursing/sitter in room;with bed alarm set (L  sidelying)   Nurse Communication Mobility status        Time: 7096-2836 OT Time Calculation (min): 59 min  Charges: OT General Charges $OT Visit: 1 Visit OT Treatments $Self Care/Home Management : 8-22 mins $Therapeutic Activity: 8-22 mins $Therapeutic Exercise: 23-37 mins  Maurie Boettcher, OT/L   Acute OT Clinical Specialist Acute Rehabilitation Services Pager [phone] Office [phone]   [facility] 11/07/2021, 10:14 AM

## 2021-11-07 NOTE — Procedures (Signed)
HD Note:  Some information was entered later than the data was gathered due to patient care needs. The stated time with the data is accurate.       Patient in bed in his room 2H 23 At the beginning of  treatment the patient was very somnolent, although he did respond when spoken to and stated his need for more covers.  Patient had pain while changing position and dressing change were completed.  Medications given, See MAR.  Patient was resting with eyes closed and light snore after the duties were completed.  BP went lower than desired limits. UF turned off, 100 ml saline bolus given, ordered albumin given. Patient had 300 ml of fluid given and 200 UF removed for a  total of +100 ml of fluids  Patient's BP was very low during the treatment with multiple interventions.  See MAR.      Access used: HD catheter Left upper chest Access issues: None, the catheter ran very well during the treatment  Total UF removed: +100   Meds given as ordered, see MAR

## 2021-11-07 NOTE — Progress Notes (Signed)
CT surgery PM rounds  Patient with stable blood pressure off inotropes this evening The plan this a.m. was for HD today but there is no record of any volume removal documented.  Bedside nurse unsure of dialysis today.  Sternal dressing has been changed this p.m., needs daily wet-to-dry dressing changes to the exposed subcutaneous tissue in the lower sternal incision.  Blood pressure 104/61, pulse 81, temperature 98.7 F (37.1 C), temperature source Oral, resp. rate (!) 27, height 5\' 7"  (1.702 m), weight 91.4 kg, SpO2 92 %.

## 2021-11-07 NOTE — Progress Notes (Signed)
21 Days Post-Op Procedure(s) (LRB): REMOVAL OF IMPELLA LEFT VENTRICULAR ASSIST DEVICE (Right) TRANSESOPHAGEAL ECHOCARDIOGRAM (TEE) (N/A) PERCUTANEOUS TRACHEOSTOMY USING SHILEY FLEXIBLE 8 mm CUFFED TRACH. (N/A) Subjective: Patient more alert and engaged today Patient was lying on his side doing isotonic strengthening with PT as able to examine the decubitus and help change the dressing.  The wound depth is to the dermis only.  A plain Mepilex dressing was applied. Objective: Vital signs in last 24 hours: Temp:  [97.7 F (36.5 C)-98.5 F (36.9 C)] 97.7 F (36.5 C) (09/27 2300) Pulse Rate:  [80-93] 82 (09/28 1030) Cardiac Rhythm: Normal sinus rhythm (09/27 2100) Resp:  [16-32] 18 (09/28 1030) BP: (86-124)/(45-71) 113/66 (09/28 1030) SpO2:  [93 %-100 %] 94 % (09/28 1030) Weight:  [83.5 kg] 83.5 kg (09/28 0600)  Hemodynamic parameters for last 24 hours:  Stable  Intake/Output from previous day: 09/27 0701 - 09/28 0700 In: 1950 [I.V.:220; NG/GT:1620; IV Piggyback:110] Out: 200 [Stool:200] Intake/Output this shift: No intake/output data recorded.  Exam Alert with stronger voice after trach decannulation Mental status appropriate Lungs clear Decubitus wound as noted above Lower sternal packing intact, superficial wound last checked yesterday with good granulation tissue at the subcu layer  Lab Results: Recent Labs    11/06/21 0859 11/07/21 0229  WBC 17.9* 18.8*  HGB 8.6* 8.8*  HCT 27.9* 28.1*  PLT 359 392   BMET:  Recent Labs    11/06/21 0859 11/07/21 0229  NA 134* 135  K 4.4 4.2  CL 93* 93*  CO2 24 24  GLUCOSE 172* 168*  BUN 62* 91*  CREATININE 4.10* 5.20*  CALCIUM 8.7* 9.2    PT/INR: No results for input(s): "LABPROT", "INR" in the last 72 hours. ABG    Component Value Date/Time   PHART 7.301 (L) 10/22/2021 2354   HCO3 19.3 (L) 10/22/2021 2354   TCO2 20 (L) 10/22/2021 2354   ACIDBASEDEF 7.0 (H) 10/22/2021 2354   O2SAT 71.7 11/02/2021 0439   CBG (last  3)  Recent Labs    11/07/21 0015 11/07/21 0456 11/07/21 0809  GLUCAP 157* 171* 143*    Assessment/Plan: S/P Procedure(s) (LRB): REMOVAL OF IMPELLA LEFT VENTRICULAR ASSIST DEVICE (Right) TRANSESOPHAGEAL ECHOCARDIOGRAM (TEE) (N/A) PERCUTANEOUS TRACHEOSTOMY USING SHILEY FLEXIBLE 8 mm CUFFED TRACH. (N/A) Patient remains severely debilitated and too weak to stand He is to remain in ICU with daily physical therapy and HD in ICU Swallowing study would be appropriate when recommended by speech therapy  LOS: 40 days    Dahlia Byes 11/07/2021

## 2021-11-07 NOTE — Plan of Care (Signed)
Problem: Education: Goal: Will demonstrate proper wound care and an understanding of methods to prevent future damage Outcome: Progressing Goal: Knowledge of disease or condition will improve Outcome: Progressing Goal: Knowledge of the prescribed therapeutic regimen will improve Outcome: Progressing Goal: Individualized Educational Video(s) Outcome: Progressing   Problem: Activity: Goal: Risk for activity intolerance will decrease Outcome: Progressing   Problem: Cardiac: Goal: Will achieve and/or maintain hemodynamic stability Outcome: Progressing   Problem: Clinical Measurements: Goal: Postoperative complications will be avoided or minimized Outcome: Progressing   Problem: Respiratory: Goal: Respiratory status will improve Outcome: Progressing   Problem: Skin Integrity: Goal: Wound healing without signs and symptoms of infection Outcome: Progressing Goal: Risk for impaired skin integrity will decrease Outcome: Progressing   Problem: Urinary Elimination: Goal: Ability to achieve and maintain adequate renal perfusion and functioning will improve Outcome: Progressing   Problem: Education: Goal: Understanding of cardiac disease, CV risk reduction, and recovery process will improve Outcome: Progressing Goal: Individualized Educational Video(s) Outcome: Progressing   Problem: Activity: Goal: Ability to tolerate increased activity will improve Outcome: Progressing   Problem: Cardiac: Goal: Ability to achieve and maintain adequate cardiovascular perfusion will improve Outcome: Progressing   Problem: Health Behavior/Discharge Planning: Goal: Ability to safely manage health-related needs after discharge will improve Outcome: Progressing   Problem: Education: Goal: Knowledge of General Education information will improve Description: Including pain rating scale, medication(s)/side effects and non-pharmacologic comfort measures Outcome: Progressing   Problem: Health  Behavior/Discharge Planning: Goal: Ability to manage health-related needs will improve Outcome: Progressing   Problem: Clinical Measurements: Goal: Ability to maintain clinical measurements within normal limits will improve Outcome: Progressing Goal: Will remain free from infection Outcome: Progressing Goal: Diagnostic test results will improve Outcome: Progressing Goal: Respiratory complications will improve Outcome: Progressing Goal: Cardiovascular complication will be avoided Outcome: Progressing   Problem: Activity: Goal: Risk for activity intolerance will decrease Outcome: Progressing   Problem: Nutrition: Goal: Adequate nutrition will be maintained Outcome: Progressing   Problem: Coping: Goal: Level of anxiety will decrease Outcome: Progressing   Problem: Elimination: Goal: Will not experience complications related to bowel motility Outcome: Progressing Goal: Will not experience complications related to urinary retention Outcome: Progressing   Problem: Pain Managment: Goal: General experience of comfort will improve Outcome: Progressing   Problem: Safety: Goal: Ability to remain free from injury will improve Outcome: Progressing   Problem: Skin Integrity: Goal: Risk for impaired skin integrity will decrease Outcome: Progressing   Problem: Education: Goal: Ability to describe self-care measures that may prevent or decrease complications (Diabetes Survival Skills Education) will improve Outcome: Progressing Goal: Individualized Educational Video(s) Outcome: Progressing   Problem: Coping: Goal: Ability to adjust to condition or change in health will improve Outcome: Progressing   Problem: Fluid Volume: Goal: Ability to maintain a balanced intake and output will improve Outcome: Progressing   Problem: Health Behavior/Discharge Planning: Goal: Ability to identify and utilize available resources and services will improve Outcome: Progressing Goal:  Ability to manage health-related needs will improve Outcome: Progressing   Problem: Metabolic: Goal: Ability to maintain appropriate glucose levels will improve Outcome: Progressing   Problem: Nutritional: Goal: Maintenance of adequate nutrition will improve Outcome: Progressing Goal: Progress toward achieving an optimal weight will improve Outcome: Progressing   Problem: Skin Integrity: Goal: Risk for impaired skin integrity will decrease Outcome: Progressing   Problem: Tissue Perfusion: Goal: Adequacy of tissue perfusion will improve Outcome: Progressing   Problem: Education: Goal: Knowledge about tracheostomy care/management will improve Outcome: Progressing   Problem: Activity: Goal:  Ability to tolerate increased activity will improve Outcome: Progressing

## 2021-11-07 NOTE — Progress Notes (Addendum)
Patient ID: [name], male   DOB: August 15, 1960, 61 y.o.   MRN: [id number]   Advanced Heart Failure Rounding Note   Subjective:    - 8/19 Pericardial window - 8/20 Cardiac arrest with tamponade -> Emergent bedside washout - 09/29/21 VA Cannulation - 09/30/21 Return to OR for mediastinal hemorrhage - 10/01/21 Developed AF -> amio - 10/02/21 TEE EF 25-30%  - 10/04/21 OR for washout. C/b continued bleeding overnight - 10/07/21 Placement of Impella 5.5 with washout, VA ECMO decannulation. Hypotensive with development of severe RV dysfunction after ECMO off and pressors titrated up. Multiple units of blood products. - 10/08/21 Brief PEA arrest. AFL with RVR >> S/p DCCV to SR, back in AFL shortly after - 8/31 Give 1UPRBCs  - 9/2 OR for chest closure - 9/3 Hypotensive overnight w/ SBPs in 80s. Febrile, mTemp 100.8. CRRT paused. VP increased to 0.04.  - 9/4 s/p bronchoscopy w/ BAL by PCCM, Cx NGTD  - 9/5 vomiting w/ large volume NGT output + watery/foul diarrhea, Tube feeds held. No signs of ileus on KUB. C-diff negative   - 9/7 OR for Impella Extraction and percutaneous tracheostomy. 2 u RBCs. - 9/8 CVVH stopped and line removed for holiday - 9/9 CVVH restarted.  -9/13 Worsening leukocytosis and pressor requirements. Purulent drainage from arterial line. Started Daptomycin, Micafungin and Meropenem. - 9/17 Daptomycin + Micafungin stopped. Remains on Meropenum  -9/19 CRRT holiday.  -9/20 Trach downsized  -9/21 transitioned to iHD  - 9/25 Tunneled HD cath placed. Trach decannulation - 9/26 Hgb 6.1>>transfused 2uRBC   C/w sacral pain. Has lidocaine patch   C/w dysphagia. SLP in room now for assessment. Remains NPO. Getting TFs   Scheduled for iHD today. BP has been ok w/ midodrine.    Objective:     Vital Signs:   Temp:  [97.7 F (36.5 C)-98.5 F (36.9 C)] 97.7 F (36.5 C) (09/27 2300) Pulse Rate:  [79-93] 93 (09/28 0605) Resp:  [17-32] 22 (09/28 0605) BP: (86-124)/(45-71) 116/69  (09/28 0605) SpO2:  [93 %-100 %] 97 % (09/28 0605) Weight:  [83.5 kg] 83.5 kg (09/28 0600) Last BM Date : 11/06/21  Weight change: Filed Weights   11/05/21 1800 11/06/21 0500 11/07/21 0600  Weight: 89.8 kg 91.4 kg 83.5 kg    Intake/Output:   Intake/Output Summary (Last 24 hours) at 11/07/2021 0902 Last data filed at 11/07/2021 0500 Gross per 24 hour  Intake 1950 ml  Output 200 ml  Net 1750 ml   PHYSICAL EXAM: General:  chronically ill appearing. No respiratory difficulty. NAD  HEENT: normal + cor trak  Neck: supple. No JVD + lt IJ THD cath Carotids 2+ bilat; no bruits. No lymphadenopathy or thyromegaly appreciated. Cor: PMI nondisplaced. Regular rate & rhythm. + sternal dressing  Lungs: CTAB Abdomen: soft, nontender, nondistended. No hepatosplenomegaly. No bruits or masses. Good bowel sounds. Extremities: no cyanosis, clubbing, rash, no edema Neuro: alert & oriented x 3, cranial nerves grossly intact. moves all 4 extremities w/o difficulty. Affect pleasant.   Telemetry: NSR 80s   Labs: Basic Metabolic Panel: Recent Labs  Lab 11/03/21 0641 11/04/21 0421 11/05/21 0409 11/06/21 0859 11/07/21 0229  NA 134* 132* 127* 134* 135  K 4.1 4.9 6.4* 4.4 4.2  CL 93* 90* 90* 93* 93*  CO2 25 23 20* 24 24  GLUCOSE 175* 133* 150* 172* 168*  BUN 77* 122* 141* 62* 91*  CREATININE 3.79* 5.11* 6.34* 4.10* 5.20*  CALCIUM 9.1 9.3 9.1 8.7* 9.2  MG 2.0 2.2  2.3 1.8 1.9  PHOS 2.7 3.0 5.7* 4.0 3.7    Liver Function Tests: Recent Labs  Lab 11/03/21 0641 11/04/21 0421 11/05/21 0409 11/06/21 0859 11/07/21 0229  ALBUMIN 2.7* 2.7* 2.5* 2.5* 2.4*   No results for input(s): "LIPASE", "AMYLASE" in the last 168 hours. No results for input(s): "AMMONIA" in the last 168 hours.  CBC: Recent Labs  Lab 11/03/21 0641 11/04/21 0421 11/05/21 1209 11/06/21 0859 11/07/21 0229  WBC 24.2* 25.7* 18.4* 17.9* 18.8*  HGB 7.0* 7.5* 6.1* 8.6* 8.8*  HCT 23.7* 24.9* 20.5* 27.9* 28.1*  MCV 100.9*  98.0 99.5 95.5 95.6  PLT 437* 432* 433* 359 392    Cardiac Enzymes: No results for input(s): "CKTOTAL", "CKMB", "CKMBINDEX", "TROPONINI" in the last 168 hours.   BNP: BNP (last 3 results) Recent Labs    12/24/20 0651 09/28/21 0405  BNP 412.0* 826.6*    ProBNP (last 3 results) No results for input(s): "PROBNP" in the last 8760 hours.    Other results:  Imaging: DG Chest Port 1 View  Result Date: 11/06/2021 CLINICAL DATA:  Cardiogenic shock.  Recent removal of tracheostomy EXAM: PORTABLE CHEST 1 VIEW COMPARISON:  11/02/2021 FINDINGS: Removal of tracheostomy tube. New large-bore central venous line with tip in the distal SVC. Removal of RIGHT IJ catheter. Feeding tube extends the stomach. Stable cardiac silhouette. LEFT pleural effusion basilar atelectasis. Mild venous congestion. IMPRESSION: 1. New large-bore central venous line with tip in the distal SVC. 2. LEFT basilar atelectasis and effusion. Electronically Signed   By: Suzy Bouchard M.D.   On: 11/06/2021 08:19     Medications:     Scheduled Medications:  sodium chloride   Intravenous Once   ascorbic acid  250 mg Per Tube BID   aspirin  81 mg Per Tube Daily   Chlorhexidine Gluconate Cloth  6 each Topical Daily   collagenase   Topical Daily   [START ON 11/12/2021] darbepoetin (ARANESP) injection - DIALYSIS  100 mcg Intravenous Q Tue-HD   feeding supplement (PROSource TF20)  60 mL Per Tube TID   feeding supplement (VITAL 1.5 CAL)  1,000 mL Per Tube Q24H   fentaNYL  1 patch Transdermal Q72H   fiber  1 packet Per Tube BID   folic acid  1 mg Per Tube Daily   Gerhardt's butt cream   Topical Daily   heparin injection (subcutaneous)  5,000 Units Subcutaneous Q8H   insulin aspart  0-20 Units Subcutaneous Q4H   insulin aspart  2 Units Subcutaneous Q4H   insulin glargine-yfgn  10 Units Subcutaneous QHS   midodrine  20 mg Per Tube TID WC   multivitamin  1 tablet Per Tube QHS   nutrition supplement (JUVEN)  1 packet Per  Tube BID BM   mouth rinse  15 mL Mouth Rinse 4 times per day   pantoprazole  40 mg Per Tube BID   QUEtiapine  50 mg Per Tube QHS   sodium chloride flush  3 mL Intravenous Q12H   thiamine  100 mg Per Tube Daily    Infusions:  sodium chloride 10 mL/hr at 11/07/21 0500   sodium chloride Stopped (11/04/21 1021)   albumin human     albumin human 12.5 g (11/01/21 2248)   anticoagulant sodium citrate     dexmedetomidine (PRECEDEX) IV infusion Stopped (11/06/21 0644)   ferric gluconate (FERRLECIT) IVPB Stopped (11/06/21 1147)   norepinephrine (LEVOPHED) Adult infusion Stopped (11/06/21 0631)    PRN Medications: sodium chloride, acetaminophen (TYLENOL) oral liquid 160  mg/5 mL, albumin human, anticoagulant sodium citrate, camphor-menthol, clonazePAM, dextrose, diphenhydrAMINE, heparin, heparin, HYDROmorphone (DILAUDID) injection, ipratropium-albuterol, ondansetron (ZOFRAN) IV, mouth rinse, [EXPIRED] oxyCODONE **FOLLOWED BY** oxyCODONE, polyethylene glycol, polyvinyl alcohol, sodium chloride flush, white petrolatum   Assessment/Plan:    1. Shock - mixed cardiogenic/hemorrhagic -> VA ECMO -> decannulated on 8/28 to Impella 5.5 - Echo 08/28: Underfilled LV, EF 55-60% with severe LVH and near normal RV systsystolic function.  - Impella extracted 9/7 -Bedside echo 09/13 - LV function preserved, RV mildly reduced. Small clot in posterior pericardium but no hemodynamic effect - Given prolonged abx course earlier in stay, addition of Daptomycin, Micafungin and meropenem recommended per ID.  - Completed total of 14 days of Micafungin (ended 9/27) - off NE. On midodrine 20 mg tid.    2. Cardiac arrest (PEA/bradycardic) - 8/19 in setting of tamponade - PEA arrest again on 08/28  3. Cardiac tamponade with emergent bedside sternotomy  - Diffuse epicardial bleeding with post-op Dresslers - return to OR 8/21 and 8/24 for washouts.  - Washout 8/28 in OR. Multiple units of blood products in OR.   -  Chest closed on 9/2 - Hgb 8.1>7.6>8.2> 8>7.5>6.1>2uRBc transfusion>>8.8   4. Acute hypoxemic respiratory failure - Off ECMO.  -  Perc Trach placed 9/7  - s/p bronchoscopy w/ BAL 9/4.  - tracheal aspirate 09/13 growing few GPC and rare yeast  - Started back on IV abx as above d/t concern for sepsis.  - Trach downsized - Trach de cannulated 9/25  - Stable on RA   5. AKI due to ATN - CRRT started 08/28.  - Remains on anuric - Transitioned to [facility] 9/21. Nephrology following  - s/p [facility] cath by IR    6. 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 Now off colchicine   7. 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 Continue aspirin 81 mg daily  8. DM2 - continue SSI   9. PAF/AFL - Tolerates poorly.  - Recurrent AFL. S/p DCCV  to SR 08/29.  - Off amio d/t bradycardia - Not on [facility] for now d/t bleeding, DVT dose heparin.  - In NSR   10. ID - Completed vancomycin/meropenem with open chest.  - WBC 17.2 >23> 26>23>>24>>25>>18K. ? Possible aspiration with vomiting.  - now off meropenum    11. Neuro Oriented x3  12. FEN - TFs ongoing  - SLP following   13. Hyperkalemia - resolved - getting iHD   14. Unstageable Pressure Ulcer, Buttock  - WOC following.   15. Abdominal Pain - Resolved.    Lyda Jester, PA-C  9:02 AM  Patient seen and examined with the above-signed Advanced Practice Provider and/or Housestaff. I personally reviewed laboratory data, imaging studies and relevant notes. I independently examined the patient and formulated the important aspects of the plan. I have edited the note to reflect any of my changes or salient points. I have personally discussed the plan with the patient and/or family.  Off NE. Tolerating iHD. Working with Theme park manager. Still requiring TFs. Sacral pain getting a bit better. Remains in NSR.   General:  sitting up in bed  No resp difficulty HEENT: normal Neck: supple. Trach wound  covered Carotids 2+ bilat; no  bruits. No lymphadenopathy or thryomegaly appreciated. Cor: Sternal dressing ok PMI nondisplaced. Regular rate & rhythm. No rubs, gallops or murmurs. + tunneled HD cath  Lungs: clear Abdomen: soft, nontender, nondistended. No hepatosplenomegaly. No bruits or masses. Good bowel sounds. Extremities: no cyanosis,  clubbing, rash, edema Neuro: alert & orientedx3, cranial nerves grossly intact. RUE weak but can lift over his head Affect pleasant  Continues to progress slowly. Continue PT/OT/Speech. Continue midodrine for BP support.   Glori Bickers, MD  5:03 PM

## 2021-11-07 NOTE — Progress Notes (Signed)
NAME:  [name], MRN:  [id number], DOB:  Sep 12, 1960, LOS: 53 ADMISSION DATE:  09/28/2021, CONSULTATION DATE:  09/29/2021 REFERRING MD:  Kipp Brood - TCTS CHIEF COMPLAINT: Dyspnea   History of Present Illness:  61 year old man who underwent CABG on 8/3, readmitted on 8/19 in setting of dyspnea with a pericardial effusion requiring pericardial window via bedside sternotomy. Also with bilateral pleural effusions. Sustained PEA arrest on 8/20, briefly required VA ECMO which was discontinued on 8/28, Impella placed 8/28, started on CRRT.  Hypoxemia, repeat cardiac arrest on 8/30. Underwent mediastinal washout/sternotomy closure 9/2. Impella removed and tracheostomy completed 9/7.  Pertinent Medical History:  CAD s/p CABG 8/3 DM2 Diverticulosis GERD Hyperlipidemia Hypertension History of stroke Tobacco Abuse  Significant Hospital Events: Including procedures, antibiotic start and stop dates in addition to other pertinent events   8/19 Admitted, s/p pericardial window 8/20 PEA cardiac arrest, left pigtail chest tube placement, PEA cardiac arrest due to tamponade, bedside sternotomy with pericardial drains placed. 8/21 Return to OR for overnight bleeding.  8/22 Minimal chest tube output.  Atrial fibrillation, controled with amiodarone.  8/22 TEE showed EF 35% at baseline, which improved significantly with decreasing ECMO flow.  8/23 Tolerated diuresis  8/24 Mediastinal washout, removal of hematoma 8/25 Ongoing bleeding issues from posterior mediastinum 8/28 Decannulated and off ECMO, 5.5 Impella placed, L femoral HD cath placed and started on CRRT 8/30 Hypoxia and hypotension followed by brief arrest, gentle L lateral chest compressions performed and ROSC in roughly 1 minute. 8/31 Remains deeply sedated on ventilator with open chest.  No acute events overnight. 9/1 No acute events overnight, remains on ventilator deep sedation.  Tolerating aggressive volume removal per CRRT 9/2 Sternotomy  closure 9/3 Wean versed 9/4 FOB, low grade temps, notable thick mucus/frequent suctioning 9/5 Vomiting, ?mucus plugging. PSV 5-6 hours on 10/5 9/6 Impella down to p3 9/7 Impella removal and trach. PRBC and DDAVP in OR  9/8 Low dose epi, weaned on 8/8 9/9 RIJ HD catheter placed 9/11 Started on ATC at 28% and tolerating well 9/12 Remains on 28% ATC 9/13 SB/Junction rhythm with associated hypotension overnight requiring transient pressor escalation. Pressor requirement down in AM. WBC count 23 (18), Co-ox 36%. Resp/Blood Cx. 9/14 Mild bradycardia/relative hypotension with Precedex. Remains on Epi 71mcg, Vaso 0.03. Resp Cx with few gram+ cocci (clusters), yeast. BCx NGTD. 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 Co-ox 48%. CRRT stopped. 9/15 WBC continues to downtrend. Hgb 7.6. Net +1.49L/24H. CRRT resumed. Co-ox 51%. NE started to help with Epi wean. 9/16, Co. oximetry improved, WBC trending down, weaning pressors 9/19 on trach collar for 96 hours.  Weaning off vasopressors.  Trial off CRRT today.  Episode of overnight emesis. 9/20 more awake. CRRT off 9/21 cuffless tracheostomy. Do well with PMV 9/22 failed swallow evaluation 9/25 Tolerating trach capping on RA. Up to chair. Trach decannulation today. Tunneled dialysis catheter placement with IR. 9/26 Doing well post-trach decannulation. Able to phonate with light pressure. S/p TDC with IR, some catheter flow issues overnight. Reattempting HD today.  Interim History / Subjective:  Still having butt pain. Breathing is okay. PT eval pending. Now off precedex  Objective:  Blood pressure (!) 98/58, pulse 82, temperature 97.7 F (36.5 C), temperature source Oral, resp. rate 20, height 5\' 7"  (1.702 m), weight 83.5 kg, SpO2 96 %.        Intake/Output Summary (Last 24 hours) at 11/07/2021 1038 Last data filed at 11/07/2021 0500 Gross per 24 hour  Intake  1740 ml  Output 200 ml  Net 1540 ml    Filed  Weights   11/05/21 1800 11/06/21 0500 11/07/21 0600  Weight: 89.8 kg 91.4 kg 83.5 kg   Physical Examination: No distress Profoundly weak Trach dressing in place Diffuse anasarca stable Aox3  Patient Lines/Drains/Airways Status     Active Line/Drains/Airways     Name Placement date Placement time Site Days   Peripheral IV 11/05/21 20 G 1.88" Anterior;Proximal;Right;Upper Arm 11/05/21  2353  Arm  2   Hemodialysis Catheter Left Internal jugular Double lumen Permanent (Tunneled) 11/04/21  1703  Internal jugular  3   Fecal Management System 10/30/21  2000  -- 8   Incision (Closed) 10/07/21 Axilla Right 10/07/21  1354  -- 31   Incision (Closed) 10/12/21 Chest Mid 10/12/21  1002  -- 26   Small Bore Feeding Tube 10 Fr. Left nare Marking at nare/corner of mouth 94 cm 10/31/21  1615  Left nare  7   Pressure Injury 10/12/21 Sacrum Mid;Lower;Bilateral Stage 3 -  Full thickness tissue loss. Subcutaneous fat may be visible but bone, tendon or muscle are NOT exposed. updated to Stage 3 by [facility] nurse 10/21/21 10/12/21  1200  -- 26   Pressure Injury 10/21/21 Buttocks Right;Upper Stage 2 -  Partial thickness loss of dermis presenting as a shallow open injury with a red, pink wound bed without slough. 10/21/21  1146  -- 17   Pressure Injury 10/21/21 Buttocks Left;Upper Unstageable - Full thickness tissue loss in which the base of the injury is covered by slough (yellow, tan, gray, green or brown) and/or eschar (tan, brown or black) in the wound bed. 10/21/21  --  -- 17   Pressure Injury 10/21/21 Ischial tuberosity Stage 2 -  Partial thickness loss of dermis presenting as a shallow open injury with a red, pink wound bed without slough. 10/21/21  1147  -- 17   Wound / Incision (Open or Dehisced) 09/30/21 Incision - Dehisced Pretibial Right;Medial 09/30/21  1300  Pretibial  38   Wound / Incision (Open or Dehisced) 10/10/21 (MARSI) Medical Adhesive-Related Skin Injury Neck Right 10/10/21  1200  Neck  28   Wound /  Incision (Open or Dehisced) 10/13/21 Incision - Dehisced Chest Mid;Lower;Right;Left Old chest tube sites 10/13/21  1200  Chest  25   Wound / Incision (Open or Dehisced) 10/16/21 Skin tear Back Right;Upper from where zoll pad was on back 10/16/21  1400  Back  22           CBG ok K okay CBC ok, H/h stable  Assessment & Plan:   Acute metabolic encephalopathy.  C/w uncontrolled pain and ICU delirium CAD s/p CABG x 3, Hemorrhagic pericarditis with tamponade PEA arrest due to tamponade, s/p emergent sternotomy Cardiogenic & hemorrhagic shock, s/p VA ECMO (decannulated 8/29), s/p Impella (removed 9/7); off all vasopressors. EF now normal. 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 SNF  Best Practice (right click and "Reselect all SmartList Selections" daily)  Diet/type: tubefeeds DVT prophylaxis: prophylactic heparin  GI prophylaxis: PPI Lines: N/A Foley:  N/A  Code Status:  full code Last date of multidisciplinary goals of care discussion patient updated daily.   Candee Furbish, MD [facility] 11/07/21 10:38 AM  Please see [email or web address] for pager details.  From 7A-7P if no response, please call [phone] After hours, please call ELink [phone]

## 2021-11-07 NOTE — Plan of Care (Signed)
Eilan Mcinerny, RN 

## 2021-11-08 DIAGNOSIS — L89150 Pressure ulcer of sacral region, unstageable: Secondary | ICD-10-CM

## 2021-11-08 DIAGNOSIS — R1310 Dysphagia, unspecified: Secondary | ICD-10-CM

## 2021-11-08 DIAGNOSIS — R5381 Other malaise: Secondary | ICD-10-CM

## 2021-11-08 LAB — RENAL FUNCTION PANEL
Albumin: 2.7 g/dL — ABNORMAL LOW (ref 3.5–5.0)
Anion gap: 16 — ABNORMAL HIGH (ref 5–15)
BUN: 48 mg/dL — ABNORMAL HIGH (ref 8–23)
CO2: 28 mmol/L (ref 22–32)
Calcium: 9.2 mg/dL (ref 8.9–10.3)
Chloride: 91 mmol/L — ABNORMAL LOW (ref 98–111)
Creatinine, Ser: 3.29 mg/dL — ABNORMAL HIGH (ref 0.61–1.24)
GFR, Estimated: 21 mL/min — ABNORMAL LOW (ref >60)
Glucose, Bld: 135 mg/dL — ABNORMAL HIGH (ref 70–99)
Phosphorus: 2.8 mg/dL (ref 2.5–4.6)
Potassium: 3.8 mmol/L (ref 3.5–5.1)
Sodium: 135 mmol/L (ref 135–145)

## 2021-11-08 LAB — CBC
HCT: 28.8 % — ABNORMAL LOW (ref 39.0–52.0)
Hemoglobin: 8.5 g/dL — ABNORMAL LOW (ref 13.0–17.0)
MCH: 29 pg (ref 26.0–34.0)
MCHC: 29.5 g/dL — ABNORMAL LOW (ref 30.0–36.0)
MCV: 98.3 fL (ref 80.0–100.0)
Platelets: 393 10*3/uL (ref 150–400)
RBC: 2.93 MIL/uL — ABNORMAL LOW (ref 4.22–5.81)
RDW: 21.6 % — ABNORMAL HIGH (ref 11.5–15.5)
WBC: 13.6 10*3/uL — ABNORMAL HIGH (ref 4.0–10.5)
nRBC: 0.9 % — ABNORMAL HIGH (ref 0.0–0.2)

## 2021-11-08 LAB — MAGNESIUM: Magnesium: 1.5 mg/dL — ABNORMAL LOW (ref 1.7–2.4)

## 2021-11-08 LAB — GLUCOSE, CAPILLARY
Glucose-Capillary: 118 mg/dL — ABNORMAL HIGH (ref 70–99)
Glucose-Capillary: 120 mg/dL — ABNORMAL HIGH (ref 70–99)
Glucose-Capillary: 145 mg/dL — ABNORMAL HIGH (ref 70–99)
Glucose-Capillary: 150 mg/dL — ABNORMAL HIGH (ref 70–99)
Glucose-Capillary: 164 mg/dL — ABNORMAL HIGH (ref 70–99)
Glucose-Capillary: 179 mg/dL — ABNORMAL HIGH (ref 70–99)

## 2021-11-08 MED ORDER — BANATROL TF EN LIQD
60.0000 mL | Freq: Three times a day (TID) | ENTERAL | Status: DC
  Administered 2021-11-08 – 2021-11-10 (×6): 60 mL
  Filled 2021-11-08 (×6): qty 60

## 2021-11-08 NOTE — Procedures (Signed)
Objective Swallowing Evaluation: Type of Study: FEES-Fiberoptic Endoscopic Evaluation of Swallow   Patient Details  Name: [name] MRN: [id number] Date of Birth: 11-03-1960  Today's Date: 11/08/2021 Time: SLP Start Time (ACUTE ONLY): 4098 -SLP Stop Time (ACUTE ONLY): 82  SLP Time Calculation (min) (ACUTE ONLY): 22 min   Past Medical History:  Past Medical History:  Diagnosis Date   AKI (acute kidney injury) ([place])    pt unaware of this   Anginal pain ([place])    CAD (coronary artery disease)    a. 03/2015 NSTEMI: LHC with severe 3V CAD  (70% mid RCA, 95% OM1, 90% distal LCx, 90% OM3, 80% prox LAD and 90% ost D1) s/p DES to mLAD w/ small dissction Rx with DES, staged ost Ramus PCI/DES and dLCx s/p PCI/DES    Chest pain 12/24/2020   Diabetes mellitus type 2 in obese [facility])    Diverticulosis    Dyspnea    Dyspnea on exertion 03/16/2015   Dyspnea on exertion   Family history of adverse reaction to anesthesia    patient father- pt states after anesthesia his father "developed dementia"   GERD (gastroesophageal reflux disease)    Hypercholesteremia    Hypertension associated with diabetes ([place]) 03/16/2015   hypertension   NSTEMI (non-ST elevated myocardial infarction) ([place]) 03/17/2015   Obesity    Stroke ([place]) 2022   pt states he had a "mini stroke" during cardiac catheterization   Tobacco abuse    Past Surgical History:  Past Surgical History:  Procedure Laterality Date   APPLICATION OF WOUND VAC N/A 10/03/2021   Procedure: APPLICATION OF WOUND VAC;  Surgeon: Dahlia Byes, MD;  Location: [place];  Service: Thoracic;  Laterality: N/A;   CANNULATION FOR ECMO (EXTRACORPOREAL MEMBRANE OXYGENATION) N/A 09/29/2021   Procedure: CANNULATION FOR ECMO (EXTRACORPOREAL MEMBRANE OXYGENATION);  Surgeon: Lajuana Matte, MD;  Location: [place];  Service: Open Heart Surgery;  Laterality: N/A;   CARDIAC CATHETERIZATION N/A 03/19/2015   Procedure: Left Heart Cath and Coronary Angiography;   Surgeon: Lorretta Harp, MD;  Location: [place] [facility];  Service: Cardiovascular;  Laterality: N/A;   CARDIAC CATHETERIZATION N/A 03/20/2015   Procedure: Coronary Stent Intervention;  Surgeon: Lorretta Harp, MD;  Location: [place] [facility];  Service: Cardiovascular;  Laterality: N/A;   CARDIAC CATHETERIZATION N/A 03/22/2015   Procedure: Coronary Stent Intervention;  Surgeon: Lorretta Harp, MD;  Location: [place] [facility];  Service: Cardiovascular;  Laterality: N/A;   CORONARY ARTERY BYPASS GRAFT N/A 09/12/2021   Procedure: CORONARY ARTERY BYPASS GRAFTING (CABG) X 5 USING LEFT INTERNAL MAMMARY ARTERY AND ENDOSCOPICALLY HARVESTED RIGHT GREATER SAPHENOUS VEIN.;  Surgeon: [name], MD;  Location: [place];  Service: Open Heart Surgery;  Laterality: N/A;   EXPLORATION POST OPERATIVE OPEN HEART N/A 09/30/2021   Procedure: EXPLORATION POST OPERATIVE OPEN HEART WASHOUT;  Surgeon: Lajuana Matte, MD;  Location: [place];  Service: Open Heart Surgery;  Laterality: N/A;   IR FLUORO GUIDE CV LINE LEFT  11/04/2021   IR US GUIDE VASC ACCESS LEFT  11/04/2021   LEFT HEART CATH AND CORONARY ANGIOGRAPHY N/A 12/25/2020   Procedure: LEFT HEART CATH AND CORONARY ANGIOGRAPHY;  Surgeon: Troy Sine, MD;  Location: [place] [facility];  Service: Cardiovascular;  Laterality: N/A;   LEFT HEART CATH AND CORONARY ANGIOGRAPHY N/A 12/26/2020   Procedure: LEFT HEART CATH AND CORONARY ANGIOGRAPHY;  Surgeon: Troy Sine, MD;  Location: [place] [facility];  Service: Cardiovascular;  Laterality: N/A;   MEDIASTINAL EXPLORATION N/A 10/03/2021   Procedure: MEDIASTINAL WASHOUT;  Surgeon: Dahlia Byes, MD;  Location: [place];  Service: Thoracic;  Laterality: N/A;  PUMP STANDBY   MEDIASTINAL EXPLORATION N/A 10/12/2021   Procedure: MEDIASTINAL WASHOUT;  Surgeon: [name], MD;  Location: [place];  Service: Thoracic;  Laterality: N/A;   PERCUTANEOUS TRACHEOSTOMY N/A 10/17/2021   Procedure: PERCUTANEOUS  TRACHEOSTOMY USING SHILEY FLEXIBLE 8 mm CUFFED [place].;  Surgeon: Candee Furbish, MD;  Location: [place];  Service: Pulmonary;  Laterality: N/A;  Percutaneous tracheostomy   PERICARDIAL WINDOW Right 09/28/2021   Procedure: PERICARDIAL WINDOW;  Surgeon: Lajuana Matte, MD;  Location: [place];  Service: Thoracic;  Laterality: Right;  Right VATS.  Lazy lateral.  double lumen ET tube   PLACEMENT OF IMPELLA LEFT VENTRICULAR ASSIST DEVICE N/A 10/07/2021   Procedure: INSERTION OF RIGHT AXILLARY IMPELLA, DECANNULATION OF ECMO, AND MEDIASTINAL WASHOUT;  Surgeon: [name], MD;  Location: [place];  Service: Open Heart Surgery;  Laterality: N/A;  Open right axillary with 8 mm Hemashield Platinum Vascular graft.   REMOVAL OF IMPELLA LEFT VENTRICULAR ASSIST DEVICE Right 10/17/2021   Procedure: REMOVAL OF IMPELLA LEFT VENTRICULAR ASSIST DEVICE;  Surgeon: [name], MD;  Location: [name];  Service: Open Heart Surgery;  Laterality: Right;   STERNAL CLOSURE N/A 10/12/2021   Procedure: STERNAL CLOSURE;  Surgeon: [name], MD;  [facility];  Service: Thoracic;  Laterality: N/A;   TEE WITHOUT CARDIOVERSION N/A 09/12/2021   Procedure: TRANSESOPHAGEAL ECHOCARDIOGRAM (TEE);  Surgeon: [name], MD;  Location: [place];  Service: Open Heart Surgery;  Laterality: N/A;   TEE WITHOUT CARDIOVERSION N/A 09/29/2021   Procedure: TRANSESOPHAGEAL ECHOCARDIOGRAM (TEE);  Surgeon: Lajuana Matte, MD;  Location: [place];  Service: Open Heart Surgery;  Laterality: N/A;   TEE WITHOUT CARDIOVERSION N/A 10/03/2021   Procedure: TRANSESOPHAGEAL ECHOCARDIOGRAM (TEE);  Surgeon: Dahlia Byes, MD;  Location: [place];  Service: Thoracic;  Laterality: N/A;   TEE WITHOUT CARDIOVERSION  10/07/2021   Procedure: TRANSESOPHAGEAL ECHOCARDIOGRAM (TEE);  Surgeon: [name], MD;  Location: [facility] OR;  Service: Open Heart Surgery;;   TEE WITHOUT CARDIOVERSION N/A 10/12/2021   Procedure: TRANSESOPHAGEAL ECHOCARDIOGRAM (TEE);  Surgeon:  [name], MD;  Location: [facility] OR;  Service: Thoracic;  Laterality: N/A;   TEE WITHOUT CARDIOVERSION N/A 10/17/2021   Procedure: TRANSESOPHAGEAL ECHOCARDIOGRAM (TEE);  Surgeon: [name], MD;  Location: [place];  Service: Open Heart Surgery;  Laterality: N/A;   TESTICLE SURGERY  1970   pt states testicle was ascended and had to be "pulled down"   TONSILLECTOMY     as a child   [name], bradycardia and hypotension. Pt with bil pleural effusion and pericardial effusion s/p VATS with pericardial window 8/19. 8/20 cardiac tamponade with arrest,  intubated with bedside mediastinal exploration and ECMO via fem access. OR 8/21 for reexploration due to mediastinal hemorrhage. 8/28 OR for washout with ECMO decannulation and Impella placed. 8/29 brief PEA arrest with DCCV to NSR. 9/2 chest closure. 9/7 trach and Impella removed.9/11 VAC removed. Decannulated 9/26. PMHx: CAD s/p recent CABG x5 (admitted  8/3-11/23 with post op Afib), CKD stage II, DM, HTN, AKI, Afib and CVA   Subjective: pt restless and not tolerating scope well    Recommendations for follow up therapy are one component of a multi-disciplinary discharge planning process, led by the attending physician.  Recommendations may  be updated based on patient status, additional functional criteria and insurance authorization.  Assessment / Plan / Recommendation     11/08/2021    2:00 PM  Clinical Impressions  Clinical Impression Pt did not tolerate passing of the scope as well for this FEES, pulling the scope out x1 and causing mild amounts of bleeding in his R nare. Although he did allow SLP to re-enter, he was moving and turning his head frequently and not following commands as consistently such that it did limit visibility during the study. That being said, suspect that some of his laryngeal edema is reduced and he did not seem to have as many standing secretions in his laryngeal vestibule (more so in his  pharynx). He still has significant, generalized pharyngeal weakness that resulted in widespread residue. This residue was noted to spill within the interarytenoid space, but unclear if it spilled all the way into his trachea or not - penetration definitely noted with aspiration not ruled out. Pt did not seem to have overt aspiration or penetration with purees, but residue was more significant and he was not able to reduce it despite cues for attempted strategies. It is possible for aspiration to occur over more trials, with testing today to brief due to limited tolerance to be able to say if he could continue to protect his airway across a meal. Would maintain NPO status but with continued ice chips after oral care. SLP will continue to follow but highly recommend MBS for next swallow study to likely get a better view of his oropharyngeal function.  SLP Visit Diagnosis Dysphagia, pharyngeal phase (R13.13)  Impact on safety and function Severe aspiration risk;Risk for inadequate nutrition/hydration         11/08/2021    2:00 PM  Treatment Recommendations  Treatment Recommendations Therapy as outlined in treatment plan below        11/08/2021    2:00 PM  Prognosis  Prognosis for Safe Diet Advancement Good  Barriers to Reach Goals Cognitive deficits       11/08/2021    2:00 PM  Diet Recommendations  SLP Diet Recommendations NPO  Medication Administration Via alternative means         11/08/2021    2:00 PM  Other Recommendations  Oral Care Recommendations Oral care QID  Other Recommendations Have oral suction available;Place PMSV during PO intake  Follow Up Recommendations Skilled nursing-short term rehab (<3 hours/day)  Assistance recommended at discharge Frequent or constant Supervision/Assistance  Functional Status Assessment Patient has had a recent decline in their functional status and demonstrates the ability to make significant improvements in function in a reasonable and  predictable amount of time.       11/08/2021    2:00 PM  Frequency and Duration   Speech Therapy Frequency (ACUTE ONLY) min 2x/week  Treatment Duration 2 weeks          No data to display             11/08/2021    2:00 PM  Pharyngeal Phase  Pharyngeal Phase Impaired  Pharyngeal- Nectar Teaspoon Reduced pharyngeal peristalsis;Reduced epiglottic inversion;Reduced anterior laryngeal mobility;Reduced laryngeal elevation;Reduced tongue base retraction;Pharyngeal residue - valleculae;Pharyngeal residue - pyriform;Lateral channel residue;Inter-arytenoid space residue;Penetration/Apiration after swallow  Pharyngeal --  Pharyngeal- Thin Teaspoon Reduced pharyngeal peristalsis;Reduced epiglottic inversion;Reduced anterior laryngeal mobility;Reduced laryngeal elevation;Reduced tongue base retraction;Pharyngeal residue - valleculae;Pharyngeal residue - pyriform;Lateral channel residue;Inter-arytenoid space residue  Pharyngeal- Puree Reduced pharyngeal peristalsis;Reduced epiglottic inversion;Reduced anterior laryngeal mobility;Reduced laryngeal elevation;Reduced tongue base  retraction;Pharyngeal residue - valleculae;Pharyngeal residue - pyriform;Lateral channel residue;Inter-arytenoid space residue         No data to display           [place]  11/08/2021, 2:16 PM

## 2021-11-08 NOTE — Progress Notes (Signed)
NAME:  [name], MRN:  [id number], DOB:  11/17/1960, LOS: 56 ADMISSION DATE:  09/28/2021, CONSULTATION DATE:  09/29/2021 REFERRING MD:  Kipp Brood - TCTS CHIEF COMPLAINT: Dyspnea   History of Present Illness:  61 year old man who underwent CABG on 8/3, readmitted on 8/19 in setting of dyspnea with a pericardial effusion requiring pericardial window via bedside sternotomy. Also with bilateral pleural effusions. Sustained PEA arrest on 8/20, briefly required VA ECMO which was discontinued on 8/28, Impella placed 8/28, started on CRRT.  Hypoxemia, repeat cardiac arrest on 8/30. Underwent mediastinal washout/sternotomy closure 9/2. Impella removed and tracheostomy completed 9/7.  Pertinent Medical History:  CAD s/p CABG 8/3 DM2 Diverticulosis GERD Hyperlipidemia Hypertension History of stroke Tobacco Abuse  Significant Hospital Events: Including procedures, antibiotic start and stop dates in addition to other pertinent events   8/19 Admitted, s/p pericardial window 8/20 PEA cardiac arrest, left pigtail chest tube placement, PEA cardiac arrest due to tamponade, bedside sternotomy with pericardial drains placed. 8/21 Return to OR for overnight bleeding.  8/22 Minimal chest tube output.  Atrial fibrillation, controled with amiodarone.  8/22 TEE showed EF 35% at baseline, which improved significantly with decreasing ECMO flow.  8/23 Tolerated diuresis  8/24 Mediastinal washout, removal of hematoma 8/25 Ongoing bleeding issues from posterior mediastinum 8/28 Decannulated and off ECMO, 5.5 Impella placed, L femoral HD cath placed and started on CRRT 8/30 Hypoxia and hypotension followed by brief arrest, gentle L lateral chest compressions performed and ROSC in roughly 1 minute. 8/31 Remains deeply sedated on ventilator with open chest.  No acute events overnight. 9/1 No acute events overnight, remains on ventilator deep sedation.  Tolerating aggressive volume removal per CRRT 9/2 Sternotomy  closure 9/3 Wean versed 9/4 FOB, low grade temps, notable thick mucus/frequent suctioning 9/5 Vomiting, ?mucus plugging. PSV 5-6 hours on 10/5 9/6 Impella down to p3 9/7 Impella removal and trach. PRBC and DDAVP in OR  9/8 Low dose epi, weaned on 8/8 9/9 RIJ HD catheter placed 9/11 Started on ATC at 28% and tolerating well 9/12 Remains on 28% ATC 9/13 [place] rhythm with associated hypotension overnight requiring transient pressor escalation. Pressor requirement down in AM. WBC count 23 (18), Co-ox 36%. Resp/Blood Cx. 9/14 Mild bradycardia/relative hypotension with Precedex. Remains on Epi 73mcg, Vaso 0.03. Resp Cx with few gram+ cocci (clusters), yeast. BCx NGTD. 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 Co-ox 48%. CRRT stopped. 9/15 WBC continues to downtrend. Hgb 7.6. Net +1.49L/24H. CRRT resumed. Co-ox 51%. NE started to help with Epi wean. 9/16, Co. oximetry improved, WBC trending down, weaning pressors 9/19 on trach collar for 96 hours.  Weaning off vasopressors.  Trial off CRRT today.  Episode of overnight emesis. 9/20 more awake. CRRT off 9/21 cuffless tracheostomy. Do well with PMV 9/22 failed swallow evaluation 9/25 Tolerating trach capping on RA. Up to chair. Trach decannulation today. Tunneled dialysis catheter placement with IR. 9/26 Doing well post-trach decannulation. Able to phonate with light pressure. S/p TDC with IR, some catheter flow issues overnight. Reattempting HD today.  Interim History / Subjective:  Still complaining of sacral pain No respiratory complaints.  Doing well in bedside chair HD yesterday  Objective:  Blood pressure (!) 105/57, pulse 86, temperature (!) 96.1 F (35.6 C), temperature source Axillary, resp. rate (!) 27, height 5\' 7"  (1.702 m), weight 88.6 kg, SpO2 100 %.        Intake/Output Summary (Last 24 hours) at 11/08/2021 0934 Last data filed at 11/08/2021 0800 Gross  per 24 hour  Intake 1824.97 ml   Output 1325 ml  Net 499.97 ml    Filed Weights   11/07/21 1420 11/08/21 0025 11/08/21 0418  Weight: 91.4 kg 88.6 kg 88.6 kg   Physical Examination:  General:  middle aged male in NAD Neuro:  Alert, oriented, non-focal HEENT:  [place]/AT, PERRL, no JVD Cardiovascular:  RRR, no MRG Lungs:  Clear Abdomen:  Soft, non-distended, non-tender.  Musculoskeletal:  No acute deformity, no sig edema.  Skin:  Intact, MMM   Assessment & Plan:   Acute metabolic encephalopathy.  C/w uncontrolled pain and ICU delirium. Improved CAD s/p CABG x 3, Hemorrhagic pericarditis with tamponade PEA arrest due to tamponade, s/p emergent sternotomy Cardiogenic & hemorrhagic shock, s/p VA ECMO (decannulated 8/29), s/p Impella (removed 9/7); off all vasopressors. EF now normal. 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 SNF  Best Practice (right click and "Reselect all SmartList Selections" daily)  Diet/type: tubefeeds DVT prophylaxis: prophylactic heparin  GI prophylaxis: PPI Lines: N/A Foley:  N/A Code Status:  full code Last date of multidisciplinary goals of care discussion patient updated daily.   Mark Harold, NP [facility] 11/08/21 9:34 AM  Please see [email or web address] for pager details.  From 7A-7P if no response, please call [phone] After hours, please call ELink [phone]

## 2021-11-08 NOTE — Plan of Care (Signed)
Problem: Education: Goal: Will demonstrate proper wound care and an understanding of methods to prevent future damage Outcome: Progressing Goal: Knowledge of disease or condition will improve Outcome: Progressing Goal: Knowledge of the prescribed therapeutic regimen will improve Outcome: Progressing Goal: Individualized Educational Video(s) Outcome: Progressing   Problem: Activity: Goal: Risk for activity intolerance will decrease Outcome: Progressing   Problem: Cardiac: Goal: Will achieve and/or maintain hemodynamic stability Outcome: Progressing   Problem: Clinical Measurements: Goal: Postoperative complications will be avoided or minimized Outcome: Progressing   Problem: Respiratory: Goal: Respiratory status will improve Outcome: Progressing   Problem: Skin Integrity: Goal: Wound healing without signs and symptoms of infection Outcome: Progressing Goal: Risk for impaired skin integrity will decrease Outcome: Progressing   Problem: Urinary Elimination: Goal: Ability to achieve and maintain adequate renal perfusion and functioning will improve Outcome: Progressing   Problem: Education: Goal: Understanding of cardiac disease, CV risk reduction, and recovery process will improve Outcome: Progressing Goal: Individualized Educational Video(s) Outcome: Progressing   Problem: Activity: Goal: Ability to tolerate increased activity will improve Outcome: Progressing   Problem: Cardiac: Goal: Ability to achieve and maintain adequate cardiovascular perfusion will improve Outcome: Progressing   Problem: Health Behavior/Discharge Planning: Goal: Ability to safely manage health-related needs after discharge will improve Outcome: Progressing   Problem: Education: Goal: Knowledge of General Education information will improve Description: Including pain rating scale, medication(s)/side effects and non-pharmacologic comfort measures Outcome: Progressing   Problem: Health  Behavior/Discharge Planning: Goal: Ability to manage health-related needs will improve Outcome: Progressing   Problem: Clinical Measurements: Goal: Ability to maintain clinical measurements within normal limits will improve Outcome: Progressing Goal: Will remain free from infection Outcome: Progressing Goal: Diagnostic test results will improve Outcome: Progressing Goal: Respiratory complications will improve Outcome: Progressing Goal: Cardiovascular complication will be avoided Outcome: Progressing   Problem: Activity: Goal: Risk for activity intolerance will decrease Outcome: Progressing   Problem: Nutrition: Goal: Adequate nutrition will be maintained Outcome: Progressing   Problem: Coping: Goal: Level of anxiety will decrease Outcome: Progressing   Problem: Elimination: Goal: Will not experience complications related to bowel motility Outcome: Progressing Goal: Will not experience complications related to urinary retention Outcome: Progressing   Problem: Pain Managment: Goal: General experience of comfort will improve Outcome: Progressing   Problem: Safety: Goal: Ability to remain free from injury will improve Outcome: Progressing   Problem: Skin Integrity: Goal: Risk for impaired skin integrity will decrease Outcome: Progressing   Problem: Education: Goal: Ability to describe self-care measures that may prevent or decrease complications (Diabetes Survival Skills Education) will improve Outcome: Progressing Goal: Individualized Educational Video(s) Outcome: Progressing   Problem: Coping: Goal: Ability to adjust to condition or change in health will improve Outcome: Progressing   Problem: Fluid Volume: Goal: Ability to maintain a balanced intake and output will improve Outcome: Progressing   Problem: Health Behavior/Discharge Planning: Goal: Ability to identify and utilize available resources and services will improve Outcome: Progressing Goal:  Ability to manage health-related needs will improve Outcome: Progressing   Problem: Metabolic: Goal: Ability to maintain appropriate glucose levels will improve Outcome: Progressing   Problem: Nutritional: Goal: Maintenance of adequate nutrition will improve Outcome: Progressing Goal: Progress toward achieving an optimal weight will improve Outcome: Progressing   Problem: Skin Integrity: Goal: Risk for impaired skin integrity will decrease Outcome: Progressing   Problem: Tissue Perfusion: Goal: Adequacy of tissue perfusion will improve Outcome: Progressing   Problem: Education: Goal: Knowledge about tracheostomy care/management will improve Outcome: Progressing   Problem: Activity: Goal:  Ability to tolerate increased activity will improve Outcome: Progressing

## 2021-11-08 NOTE — Consult Note (Signed)
[place] Nurse Consult Note: Patient receiving care in [place]. Reason for Consult: "sternal wound is not healing - drainage on old dressing is green - may need cultures again - hydratherapy?" Wound type: surgical wound managed by CTCS. Please see note from CT surgery PM rounds on 11/07/21 by Dr. Einar Grad. According to this note the sternal wound needs daily wet-to-dry dressing changes to the exposed subcutaneous tissue in the lower sternal incision.  I spoke with Estill Bamberg, nightshift RN, about the consult to the [facility] team for this wound.  She verified the wound in Dr. Thayer Ohm note is the one for which the consult was placed. I explained to her that the concerns about the wound represented in the consult order need to be discussed directly with the [place] team.  I recommended that this be done today. She verbalized her understanding of the information I relayed to her.  [place] nurse will not follow at this time.  Please re-consult the [place] team if needed.  Val Riles, RN, MSN, CWOCN, CNS-BC, pager [phone]

## 2021-11-08 NOTE — Progress Notes (Signed)
22 Days Post-Op Procedure(s) (LRB): REMOVAL OF IMPELLA LEFT VENTRICULAR ASSIST DEVICE (Right) TRANSESOPHAGEAL ECHOCARDIOGRAM (TEE) (N/A) PERCUTANEOUS TRACHEOSTOMY USING SHILEY FLEXIBLE 8 mm CUFFED TRACH. (N/A) Subjective: Had some delirium / pain issues overnight Nsr  I inspected both wounds , lower sternal and sacral, both are getting better and need daily wound care as ordered WBC normal 13k, off antibiotics Objective: Vital signs in last 24 hours: Temp:  [98.2 F (36.8 C)-98.7 F (37.1 C)] 98.2 F (36.8 C) (09/29 0400) Pulse Rate:  [72-88] 81 (09/29 0700) Cardiac Rhythm: Normal sinus rhythm (09/28 1600) Resp:  [8-35] 27 (09/29 0700) BP: (70-159)/(28-95) 105/57 (09/29 0700) SpO2:  [90 %-100 %] 100 % (09/29 0700) Weight:  [88.6 kg-91.4 kg] 88.6 kg (09/29 0418)  Hemodynamic parameters for last 24 hours:  nsr  Intake/Output from previous day: 09/28 0701 - 09/29 0700 In: 1924 [I.V.:175; NG/GT:1399; IV Piggyback:120] Out: 1561 [Stool:1325] Intake/Output this shift: No intake/output data recorded.  EXAM  Clear speech. Breathing w/o problem  nsr Lab Results: Recent Labs    11/07/21 0229 11/08/21 0420  WBC 18.8* 13.6*  HGB 8.8* 8.5*  HCT 28.1* 28.8*  PLT 392 393   BMET:  Recent Labs    11/07/21 0229 11/08/21 0420  NA 135 135  K 4.2 3.8  CL 93* 91*  CO2 24 28  GLUCOSE 168* 135*  BUN 91* 48*  CREATININE 5.20* 3.29*  CALCIUM 9.2 9.2    PT/INR: No results for input(s): "LABPROT", "INR" in the last 72 hours. ABG    Component Value Date/Time   PHART 7.301 (L) 10/22/2021 2354   HCO3 19.3 (L) 10/22/2021 2354   TCO2 20 (L) 10/22/2021 2354   ACIDBASEDEF 7.0 (H) 10/22/2021 2354   O2SAT 71.7 11/02/2021 0439   CBG (last 3)  Recent Labs    11/07/21 2007 11/08/21 0014 11/08/21 0406  GLUCAP 162* 145* 120*    Assessment/Plan: S/P Procedure(s) (LRB): REMOVAL OF IMPELLA LEFT VENTRICULAR ASSIST DEVICE (Right) TRANSESOPHAGEAL ECHOCARDIOGRAM (TEE)  (N/A) PERCUTANEOUS TRACHEOSTOMY USING SHILEY FLEXIBLE 8 mm CUFFED TRACH. (N/A) Swallow eval today HD per renal   LOS: 41 days    Dahlia Byes 11/08/2021

## 2021-11-08 NOTE — Progress Notes (Addendum)
Patient ID: [name], male   DOB: [birth year], 61 y.o.   MRN: [id number]   Advanced Heart Failure Rounding Note   Subjective:    - 8/19 Pericardial window - 8/20 Cardiac arrest with tamponade -> Emergent bedside washout - 09/29/21 VA Cannulation - 09/30/21 Return to OR for mediastinal hemorrhage - 10/01/21 Developed AF -> amio - 10/02/21 TEE EF 25-30%  - 10/04/21 OR for washout. C/b continued bleeding overnight - 10/07/21 Placement of Impella 5.5 with washout, VA ECMO decannulation. Hypotensive with development of severe RV dysfunction after ECMO off and pressors titrated up. Multiple units of blood products. - 10/08/21 Brief PEA arrest. AFL with RVR >> S/p DCCV to SR, back in AFL shortly after - 8/31 Give 1UPRBCs  - 9/2 OR for chest closure - 9/3 Hypotensive overnight w/ SBPs in 80s. Febrile, mTemp 100.8. CRRT paused. VP increased to 0.04.  - 9/4 s/p bronchoscopy w/ BAL by PCCM, Cx NGTD  - 9/5 vomiting w/ large volume NGT output + watery/foul diarrhea, Tube feeds held. No signs of ileus on KUB. C-diff negative   - 9/7 OR for Impella Extraction and percutaneous tracheostomy. 2 u RBCs. - 9/8 CVVH stopped and line removed for holiday - 9/9 CVVH restarted.  -9/13 Worsening leukocytosis and pressor requirements. Purulent drainage from arterial line. Started Daptomycin, Micafungin and Meropenem. - 9/17 Daptomycin + Micafungin stopped. Remains on Meropenum  -9/19 CRRT holiday.  -9/20 Trach downsized  -9/21 transitioned to iHD  - 9/25 Tunneled HD cath placed. Trach decannulation - 9/26 Hgb 6.1>>transfused 2uRBC    Complaining of buttock and chest pain.   Objective:     Vital Signs:   Temp:  [98.2 F (36.8 C)-98.7 F (37.1 C)] 98.2 F (36.8 C) (09/29 0400) Pulse Rate:  [72-88] 81 (09/29 0700) Resp:  [8-35] 27 (09/29 0700) BP: (70-159)/(28-95) 105/57 (09/29 0700) SpO2:  [90 %-100 %] 100 % (09/29 0700) Weight:  [88.6 kg-91.4 kg] 88.6 kg (09/29 0418) Last BM Date : 11/07/21  (flexiseal)  Weight change: Filed Weights   11/07/21 1420 11/08/21 0025 11/08/21 0418  Weight: 91.4 kg 88.6 kg 88.6 kg    Intake/Output:   Intake/Output Summary (Last 24 hours) at 11/08/2021 0741 Last data filed at 11/08/2021 0649 Gross per 24 hour  Intake 1923.97 ml  Output 1325 ml  Net 598.97 ml   General:   No resp difficulty HEENT: + Cortrak  Neck:dressing from previous trach, supple. no JVD. Carotids 2+ bilat; no bruits. No lymphadenopathy or thryomegaly appreciated. LIJ HD cath Cor: PMI nondisplaced. Regular rate & rhythm. No rubs, gallops or murmurs. Lungs: clear Abdomen: soft, nontender, nondistended. No hepatosplenomegaly. No bruits or masses. Good bowel sounds. + flexi seal Extremities: no cyanosis, clubbing, rash, edema. R and L heel dressings.  Neuro: alert & orientedx3, cranial nerves grossly intact. moves all 4 extremities w/o difficulty. Affect flat  Skin : Buttock wound with slough. Sternal wound with drainage.   Telemetry: NSR 80s   Labs: Basic Metabolic Panel: Recent Labs  Lab 11/04/21 0421 11/05/21 0409 11/06/21 0859 11/07/21 0229 11/08/21 0420  NA 132* 127* 134* 135 135  K 4.9 6.4* 4.4 4.2 3.8  CL 90* 90* 93* 93* 91*  CO2 23 20* _0 GLUCOSE 133* 150* 172* 168* 135*  BUN 122* 141* 62* 91* 48*  CREATININE 5.11* 6.34* 4.10* 5.20* 3.29*  CALCIUM 9.3 9.1 8.7* 9.2 9.2  MG 2.2 2.3 1.8 1.9 1.5*  PHOS 3.0 5.7* 4.0 3.7 2.8  Liver Function Tests: Recent Labs  Lab 11/04/21 0421 11/05/21 0409 11/06/21 0859 11/07/21 0229 11/08/21 0420  ALBUMIN 2.7* 2.5* 2.5* 2.4* 2.7*   No results for input(s): "LIPASE", "AMYLASE" in the last 168 hours. No results for input(s): "AMMONIA" in the last 168 hours.  CBC: Recent Labs  Lab 11/04/21 0421 11/05/21 1209 11/06/21 0859 11/07/21 0229 11/08/21 0420  WBC 25.7* 18.4* 17.9* 18.8* 13.6*  HGB 7.5* 6.1* 8.6* 8.8* 8.5*  HCT 24.9* 20.5* 27.9* 28.1* 28.8*  MCV 98.0 99.5 95.5 95.6 98.3  PLT 432* 433* 359  392 393    Cardiac Enzymes: No results for input(s): "CKTOTAL", "CKMB", "CKMBINDEX", "TROPONINI" in the last 168 hours.   BNP: BNP (last 3 results) Recent Labs    12/24/20 0651 09/28/21 0405  BNP 412.0* 826.6*    ProBNP (last 3 results) No results for input(s): "PROBNP" in the last 8760 hours.    Other results:  Imaging: No results found.   Medications:     Scheduled Medications:  sodium chloride   Intravenous Once   ascorbic acid  250 mg Per Tube BID   aspirin  81 mg Per Tube Daily   Chlorhexidine Gluconate Cloth  6 each Topical Daily   collagenase   Topical Daily   [START ON 11/12/2021] darbepoetin (ARANESP) injection - DIALYSIS  100 mcg Intravenous Q Tue-HD   feeding supplement (PROSource TF20)  60 mL Per Tube TID   feeding supplement (VITAL 1.5 CAL)  1,000 mL Per Tube Q24H   fentaNYL  1 patch Transdermal Q72H   fiber  1 packet Per Tube BID   folic acid  1 mg Per Tube Daily   Gerhardt's butt cream   Topical Daily   heparin injection (subcutaneous)  5,000 Units Subcutaneous Q8H   insulin aspart  0-20 Units Subcutaneous Q4H   insulin aspart  2 Units Subcutaneous Q4H   insulin glargine-yfgn  10 Units Subcutaneous QHS   midodrine  20 mg Per Tube TID WC   multivitamin  1 tablet Per Tube QHS   nutrition supplement (JUVEN)  1 packet Per Tube BID BM   mouth rinse  15 mL Mouth Rinse 4 times per day   pantoprazole  40 mg Per Tube BID   QUEtiapine  50 mg Per Tube QHS   sodium chloride flush  3 mL Intravenous Q12H   thiamine  100 mg Per Tube Daily    Infusions:  sodium chloride 10 mL/hr at 11/08/21 0649   sodium chloride Stopped (11/04/21 1021)   albumin human 12.5 g (11/01/21 2248)   anticoagulant sodium citrate     ferric gluconate (FERRLECIT) IVPB Stopped (11/07/21 1142)   norepinephrine (LEVOPHED) Adult infusion Stopped (11/06/21 0631)    PRN Medications: sodium chloride, acetaminophen (TYLENOL) oral liquid 160 mg/5 mL, albumin human, anticoagulant sodium  citrate, camphor-menthol, clonazePAM, dextrose, diphenhydrAMINE, heparin, heparin, HYDROmorphone (DILAUDID) injection, ipratropium-albuterol, ondansetron (ZOFRAN) IV, mouth rinse, [EXPIRED] oxyCODONE **FOLLOWED BY** oxyCODONE, polyethylene glycol, polyvinyl alcohol, sodium chloride flush, white petrolatum   Assessment/Plan:    1. Shock - mixed cardiogenic/hemorrhagic -> VA ECMO -> decannulated on 8/28 to Impella 5.5 - Echo 08/28: Underfilled LV, EF 55-60% with severe LVH and near normal RV systsystolic function.  - Impella extracted 9/7 -Bedside echo 09/13 - LV function preserved, RV mildly reduced. Small clot in posterior pericardium but no hemodynamic effect - Given prolonged abx course earlier in stay, addition of Daptomycin, Micafungin and meropenem recommended per ID.  - Completed total of 14 days of Micafungin (ended  9/27) - off NE. -  On midodrine 20 mg tid.   2. Cardiac arrest (PEA/bradycardic) - 8/19 in setting of tamponade - PEA arrest again on 08/28  3. Cardiac tamponade with emergent bedside sternotomy  - Diffuse epicardial bleeding with post-op Dresslers - return to OR 8/21 and 8/24 for washouts.  - Washout 8/28 in OR. Multiple units of blood products in OR.   - Chest closed on 9/2 - Hgb 8.1>7.6>8.2> 8>7.5>6.1>2uRBc transfusion>>8.8 >>8.5   4. Acute hypoxemic respiratory failure - Off ECMO.  -  Perc Trach placed 9/7  - s/p bronchoscopy w/ BAL 9/4.  - tracheal aspirate 09/13 growing few GPC and rare yeast  - Started back on IV abx as above d/t concern for sepsis.  - Trach downsized - Trach de cannulated 9/25  - Sats stable on room air.   5. AKI due to ATN - CRRT started 08/28.  - Remains on anuric - Transitioned to [facility] 9/21. Nephrology following  - s/p [facility] cath by IR    6. 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 Now off colchicine   7. 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 Continue aspirin 81 mg daily  8. DM2 - continue SSI    9. PAF/AFL - Tolerates poorly.  - Recurrent AFL. S/p DCCV  to SR 08/29.  - Off amio d/t bradycardia - Not on [facility] for now d/t bleeding, DVT dose heparin.  - In SR   10. ID - Completed vancomycin/meropenem with open chest.  - WBC 13.6  - Possible aspiration with vomiting.  - now off meropenum    11. Neuro Oriented x3  12. FEN - TFs ongoing  - SLP following   13. Hyperkalemia - resolved - getting iHD   14. Unstageable Pressure Ulcer, Buttock  - WOC following.  - Needs hypdrotherapy.   15. Abdominal Pain - Resolved.   Disposition- SNF. Not eligible for LTAC due to insurance.    Darrick Grinder, NP  7:41 AM  Patient seen and examined with the above-signed Advanced Practice Provider and/or Housestaff. I personally reviewed laboratory data, imaging studies and relevant notes. I independently examined the patient and formulated the important aspects of the plan. I have edited the note to reflect any of my changes or salient points. I have personally discussed the plan with the patient and/or family.  Off pressors this am. Walking halls with assistance. Tolerating iHD. Sacrum still sore.   General:  Walking hall.  HEENT: normal Neck: supple. JVP 7-8 + dressing over trach site Carotids 2+ bilat; no bruits. No lymphadenopathy or thryomegaly appreciated. Cor: PMI nondisplaced. Regular rate & rhythm. No rubs, gallops or murmurs. Sternal wound dressed Lungs: clear Abdomen: soft, nontender, nondistended. No hepatosplenomegaly. No bruits or masses. Good bowel sounds. Extremities: no cyanosis, clubbing, rash, edema Neuro: alert & orientedx3, cranial nerves grossly intact. moves all 4 extremities w/o difficulty. Affect pleasant  Continues to progress. Off pressors. Remains on midodrine for BP support. Chest wound improved (d/w Dr. Prescott Gum).   HF team will see again on Monday. Please call over the weekend if needed.   Glori Bickers, MD  6:35 PM

## 2021-11-08 NOTE — Progress Notes (Signed)
Physical Therapy Treatment Patient Details Name: [name] MRN: [id number] DOB: Nov 15, 1960 Today's Date: 11/08/2021   History of Present Illness Pt is a 61 y.o. male admitted 09/28/21 with SOB, bradycardia, hypotension. Workup for bilateral pleural effusion, pericardial effusion s/p VATS with pericardial window 8/19. Cardiac arrest with tamponade 8/20, intubated with bedside mediastinal exploration, ECMO via fem access. Pt with mediastinal hemorrhage s/p reexploration 8/21. To OR 8/28 for washout, ECMO decannulation, Impella placement. CRRT initiated 8/28. Brief PEA arrest 8/29 with DCCV to NSR.  S/p chest closure 9/2. S/p trach and impella removed 9/7. S/p wound vac removal 9/11. Course complicated by bouts of hypotension and bradycardia. Off CRRT 9/19. Trach decannulated 9/25. Tunned HD cath placement 9/25; iHD initiated. PMH includes CAD (s/p recent CABG 09/12/21), CKD2, DM, HTN, AKI, afib, CVA.    PT Comments    Pt pleasant with sore sacrum but able to tolerate EOB with increased assistance moving from supine to sit. Static sitting balance improving as well as tolerance for standing trials. Pt transferred from bed to chair with geomat and pillow in chair for pressure relief with pt taken for tour around the unit in chair for change of scenery which pt appreciated. Would benefit from attempts to get outside in the future. D/C plan remains appropriate.   109/53 (70)supine 89/67 (73) post activity HR 88-93 SpO2 97-100% on RA    Recommendations for follow up therapy are one component of a multi-disciplinary discharge planning process, led by the attending physician.  Recommendations may be updated based on patient status, additional functional criteria and insurance authorization.  Follow Up Recommendations  Skilled nursing-short term rehab (<3 hours/day) Can patient physically be transported by private vehicle: No   Assistance Recommended at Discharge Frequent or constant  Supervision/Assistance  Patient can return home with the following Two people to help with walking and/or transfers;Two people to help with bathing/dressing/bathroom;Direct supervision/assist for medications management;Assistance with feeding;Assistance with cooking/housework;Direct supervision/assist for financial management;Assist for transportation   Equipment Recommendations  Wheelchair (measurements PT);Wheelchair cushion (measurements PT);Hospital bed    Recommendations for Other Services       Precautions / Restrictions Precautions Precautions: Sternal;Fall;Other (comment) Precaution Comments: cortrak, flexiseal, painful sacral/buttocks wound Restrictions Weight Bearing Restrictions: No     Mobility  Bed Mobility Overal bed mobility: Needs Assistance Bed Mobility: Sidelying to Sit   Sidelying to sit: Max assist       General bed mobility comments: pt in right sidelying on arrival with mod assist to bring legs off of bed and max assist to elevate trunk to sitting. max assist to scoot at EOB and max +2 to scoot back in chair    Transfers Overall transfer level: Needs assistance   Transfers: Sit to/from Stand Sit to Stand: Mod assist, +2 physical assistance           General transfer comment: mod +2 assist to stand from bed, stedy pads and recliner with stedy present, cues for hands on thighs and assist to initiate power up and rise. Mod cues in standing for posture. Max standing 30 sec x 2 trials. Pt with tight achilles limiting tolerance. bed to chair via stedy Transfer via Lift Equipment: Stedy  Ambulation/Gait               General Gait Details: unable   Stairs             Wheelchair Mobility    Modified Rankin (Stroke Patients Only)       Balance Overall balance  assessment: Needs assistance Sitting-balance support: Feet supported, No upper extremity supported Sitting balance-Leahy Scale: Fair Sitting balance - Comments: minguard EOB,  initial posterior LOB with tactile cues to maintain sitting   Standing balance support: During functional activity, Bilateral upper extremity supported Standing balance-Leahy Scale: Poor Standing balance comment: LUE support on stedy and physical assist for balance in stedy with knees blocked                            Cognition Arousal/Alertness: Awake/alert Behavior During Therapy: Anxious Overall Cognitive Status: Impaired/Different from baseline Area of Impairment: Attention, Safety/judgement, Awareness, Problem solving                   Current Attention Level: Selective Memory: Decreased recall of precautions Following Commands: Follows one step commands consistently Safety/Judgement: Decreased awareness of deficits Awareness: Emergent Problem Solving: Slow processing, Decreased initiation General Comments: anxious with mobility needing reassurance and cues for hand placement and precautions        Exercises      General Comments        Pertinent Vitals/Pain Pain Assessment Pain Score: 7  Pain Location: buttocks Pain Descriptors / Indicators: Discomfort, Sore, Grimacing, Guarding, Moaning Pain Intervention(s): Limited activity within patient's tolerance, Monitored during session, Repositioned    Home Living                          Prior Function            PT Goals (current goals can now be found in the care plan section) Progress towards PT goals: Progressing toward goals    Frequency    Min 3X/week      PT Plan Current plan remains appropriate    Co-evaluation              AM-PAC PT "6 Clicks" Mobility   Outcome Measure  Help needed turning from your back to your side while in a flat bed without using bedrails?: A Lot Help needed moving from lying on your back to sitting on the side of a flat bed without using bedrails?: Total Help needed moving to and from a bed to a chair (including a wheelchair)?:  Total Help needed standing up from a chair using your arms (e.g., wheelchair or bedside chair)?: Total Help needed to walk in hospital room?: Total Help needed climbing 3-5 steps with a railing? : Total 6 Click Score: 7    End of Session Equipment Utilized During Treatment: Gait belt Activity Tolerance: Patient tolerated treatment well Patient left: in chair;with call bell/phone within reach;with chair alarm set;with nursing/sitter in room Nurse Communication: Mobility status;Need for lift equipment PT Visit Diagnosis: Other abnormalities of gait and mobility (R26.89);Difficulty in walking, not elsewhere classified (R26.2);Muscle weakness (generalized) (M62.81)     Time: 6384-5364 PT Time Calculation (min) (ACUTE ONLY): 36 min  Charges:  $Therapeutic Activity: 23-37 mins                     [name], PT Acute Rehabilitation Services Office: [phone]    [name] 11/08/2021, 9:51 AM

## 2021-11-08 NOTE — TOC CM/SW Note (Addendum)
HF TOC CM spoke to pt's wife, [name], she has contacted an attorney to assist with SS disability. [facility] CCM, Heart Failure TOC CM [phone]   HF TOC CM spoke to pt and states he had started disability process. Received notification that paperwork due on 11/10/2021. Explained TOC CM unable to complete SSA 3368. Wife had completed pt's section. Pt wanted SS disability notified that he was in hospital and unable to complete forms. Received call back from [place], [phone]  ext [phone], left message for return call. VM stated they cannot assist once application has been started. [place], Heart Failure TOC CM [phone]

## 2021-11-08 NOTE — Progress Notes (Signed)
Subjective:   Seen in room No pressors HD yesterday confirmed No UOP  Objective Vital signs in last 24 hours: Vitals:   11/08/21 0500 11/08/21 0600 11/08/21 0700 11/08/21 0800  BP: (!) 94/57 (!) 86/60 (!) 105/57   Pulse: 80 83 81 86  Resp: (!) 22 (!) 26 (!) 27   Temp:   (!) 96.1 F (35.6 C)   TempSrc:   Axillary   SpO2: 97% 98% 100% 100%  Weight:      Height:       Weight change: 7.893 kg  Intake/Output Summary (Last 24 hours) at 11/08/2021 0926 Last data filed at 11/08/2021 0800 Gross per 24 hour  Intake 1824.97 ml  Output 1325 ml  Net 499.97 ml    Assessment/Plan:     61 y.o. yo male  with HTN, HLD, DM, A-fib, stroke, CKD, CAD status post CABG on 8/3 presented with SOB, found cardiac tamponade, course complicated by cardiac arrest, use of ECMO, seen as a consultation for AKI    #Anuric Dialysis dependent Acute kidney injury on CKD IIIa.   Ischemic ATN due to cardiogenic shock/cardiac arrest.   Started CRRT on 8/28 for decreasing urine output and fluid/volume management.  -  stopped CRRT on 9/19. Tolerating iHD, did not req pressors last Tx but remains on high dose midodrine support, on THS schedule currently S/p L IJ TDC with IR 9/25 HD 9/30. 2K, 2L UF BP permitting, tight heparin   #Cardiac tamponade/hemorrhagic pericarditis status post pericardial drain placement with subsequent ECMO placement.  decannulated  with placement of Impella.  S/p closure of the sternum and mediastinal washout on 9/2.   #Cardiogenic shock/cardiac arrest: Was on ECMO.   midodrine per primary/AHF team.  Impella out on 9/7- remarkable improvement -     #Acute hypoxic respiratory failure:  improving; decannulated  #Acute blood loss anemia: Monitor hemoglobin and transfuse as needed by ICU team.  Aranesp 100 weekly. 9/25 TSAT 10% and Ferritin 215, rec IV Fe  Jayston Trevino Modesta Messing    Labs: Basic Metabolic Panel: Recent Labs  Lab 11/06/21 0859 11/07/21 0229 11/08/21 0420  NA 134* 135 135  K  4.4 4.2 3.8  CL 93* 93* 91*  CO2 24 24 28   GLUCOSE 172* 168* 135*  BUN 62* 91* 48*  CREATININE 4.10* 5.20* 3.29*  CALCIUM 8.7* 9.2 9.2  PHOS 4.0 3.7 2.8    Liver Function Tests: Recent Labs  Lab 11/06/21 0859 11/07/21 0229 11/08/21 0420  ALBUMIN 2.5* 2.4* 2.7*    No results for input(s): "LIPASE", "AMYLASE" in the last 168 hours. No results for input(s): "AMMONIA" in the last 168 hours. CBC: Recent Labs  Lab 11/04/21 0421 11/05/21 1209 11/06/21 0859 11/07/21 0229 11/08/21 0420  WBC 25.7* 18.4* 17.9* 18.8* 13.6*  HGB 7.5* 6.1* 8.6* 8.8* 8.5*  HCT 24.9* 20.5* 27.9* 28.1* 28.8*  MCV 98.0 99.5 95.5 95.6 98.3  PLT 432* 433* 359 392 393    Cardiac Enzymes: No results for input(s): "CKTOTAL", "CKMB", "CKMBINDEX", "TROPONINI" in the last 168 hours.  CBG: Recent Labs  Lab 11/07/21 1558 11/07/21 2007 11/08/21 0014 11/08/21 0406 11/08/21 0828  GLUCAP 100* 162* 145* 120* 164*     Iron Studies: No results for input(s): "IRON", "TIBC", "TRANSFERRIN", "FERRITIN" in the last 72 hours.  Studies/Results: No results found. Medications: Infusions:  sodium chloride 10 mL/hr at 11/08/21 0649   sodium chloride Stopped (11/04/21 1021)   albumin human 12.5 g (11/01/21 2248)   anticoagulant sodium citrate  ferric gluconate (FERRLECIT) IVPB Stopped (11/07/21 1142)   norepinephrine (LEVOPHED) Adult infusion Stopped (11/06/21 0631)    Scheduled Medications:  sodium chloride   Intravenous Once   ascorbic acid  250 mg Per Tube BID   aspirin  81 mg Per Tube Daily   Chlorhexidine Gluconate Cloth  6 each Topical Daily   collagenase   Topical Daily   [START ON 11/12/2021] darbepoetin (ARANESP) injection - DIALYSIS  100 mcg Intravenous Q Tue-HD   feeding supplement (PROSource TF20)  60 mL Per Tube TID   feeding supplement (VITAL 1.5 CAL)  1,000 mL Per Tube Q24H   fentaNYL  1 patch Transdermal Q72H   fiber  1 packet Per Tube BID   folic acid  1 mg Per Tube Daily   Gerhardt's  butt cream   Topical Daily   heparin injection (subcutaneous)  5,000 Units Subcutaneous Q8H   insulin aspart  0-20 Units Subcutaneous Q4H   insulin aspart  2 Units Subcutaneous Q4H   insulin glargine-yfgn  10 Units Subcutaneous QHS   midodrine  20 mg Per Tube TID WC   multivitamin  1 tablet Per Tube QHS   nutrition supplement (JUVEN)  1 packet Per Tube BID BM   mouth rinse  15 mL Mouth Rinse 4 times per day   pantoprazole  40 mg Per Tube BID   QUEtiapine  50 mg Per Tube QHS   sodium chloride flush  3 mL Intravenous Q12H   thiamine  100 mg Per Tube Daily    have reviewed scheduled and prn medications.  Physical Exam: General:  resting, nad Heart: RRR Lungs: mostly clear Abdomen: slightly distended Extremities: no edema Dialysis Access: L IJ TDC bandaged   11/08/2021,9:26 AM  LOS: 41 days

## 2021-11-08 NOTE — TOC Progression Note (Addendum)
Transition of Care [facility]) - Progression Note    Patient Details  Name: LONZA SHIMABUKURO MRN: [id number] Date of Birth: [birth year]  Transition of [facility]) CM/SW [place], [place] Phone Number: 11/08/2021, 4:21 PM  Clinical Narrative:     Current insurance barrier to SNF placement.CSW reached out to [place] with [facility] leadership about the [facility] program for patient. Evelena Peat confirmed referral can be sent to [facility] CSW to follow up with Evelena Peat on Monday to send referral to [facility] program. Due to patients current orientation CSW spoke with patients spouse [name], [name] is in agreement for CSW to send referral to [facility] on Monday for patient.All questions answered. No further questions reported at this time.TOC following and will continue to assist with patients dc planning needs.   Expected Discharge Plan: IP Rehab Facility Barriers to Discharge: Continued Medical Work up  Expected Discharge Plan and Services Expected Discharge Plan: [place]                                               Social Determinants of Health (SDOH) Interventions    Readmission Risk Interventions    09/20/2021   11:57 AM  Readmission Risk Prevention Plan  Post Dischage Appt Complete  Medication Screening Complete  Transportation Screening Complete

## 2021-11-09 LAB — GLUCOSE, CAPILLARY
Glucose-Capillary: 101 mg/dL — ABNORMAL HIGH (ref 70–99)
Glucose-Capillary: 119 mg/dL — ABNORMAL HIGH (ref 70–99)
Glucose-Capillary: 144 mg/dL — ABNORMAL HIGH (ref 70–99)
Glucose-Capillary: 148 mg/dL — ABNORMAL HIGH (ref 70–99)
Glucose-Capillary: 151 mg/dL — ABNORMAL HIGH (ref 70–99)
Glucose-Capillary: 151 mg/dL — ABNORMAL HIGH (ref 70–99)
Glucose-Capillary: 92 mg/dL (ref 70–99)

## 2021-11-09 LAB — CBC
HCT: 27.4 % — ABNORMAL LOW (ref 39.0–52.0)
Hemoglobin: 8.3 g/dL — ABNORMAL LOW (ref 13.0–17.0)
MCH: 29.5 pg (ref 26.0–34.0)
MCHC: 30.3 g/dL (ref 30.0–36.0)
MCV: 97.5 fL (ref 80.0–100.0)
Platelets: 440 10*3/uL — ABNORMAL HIGH (ref 150–400)
RBC: 2.81 MIL/uL — ABNORMAL LOW (ref 4.22–5.81)
RDW: 20.4 % — ABNORMAL HIGH (ref 11.5–15.5)
WBC: 13.1 10*3/uL — ABNORMAL HIGH (ref 4.0–10.5)
nRBC: 0.5 % — ABNORMAL HIGH (ref 0.0–0.2)

## 2021-11-09 LAB — RENAL FUNCTION PANEL
Albumin: 2.6 g/dL — ABNORMAL LOW (ref 3.5–5.0)
Anion gap: 16 — ABNORMAL HIGH (ref 5–15)
BUN: 85 mg/dL — ABNORMAL HIGH (ref 8–23)
CO2: 26 mmol/L (ref 22–32)
Calcium: 9.3 mg/dL (ref 8.9–10.3)
Chloride: 96 mmol/L — ABNORMAL LOW (ref 98–111)
Creatinine, Ser: 4.83 mg/dL — ABNORMAL HIGH (ref 0.61–1.24)
GFR, Estimated: 13 mL/min — ABNORMAL LOW (ref >60)
Glucose, Bld: 148 mg/dL — ABNORMAL HIGH (ref 70–99)
Phosphorus: 3.2 mg/dL (ref 2.5–4.6)
Potassium: 4 mmol/L (ref 3.5–5.1)
Sodium: 138 mmol/L (ref 135–145)

## 2021-11-09 LAB — MAGNESIUM: Magnesium: 1.8 mg/dL (ref 1.7–2.4)

## 2021-11-09 MED ORDER — HEPARIN SODIUM (PORCINE) 1000 UNIT/ML DIALYSIS: 20.0000 [IU]/kg | INTRAMUSCULAR | Status: DC | PRN

## 2021-11-09 MED ORDER — ALTEPLASE 2 MG IJ SOLR
4.0000 mg | INTRAMUSCULAR | Status: AC
  Administered 2021-11-09: 4 mg
  Filled 2021-11-09: qty 4

## 2021-11-09 NOTE — Progress Notes (Signed)
Pt cathflo dwell per MD order. Alert, vss, report to ICU RN. Will attempt HD again after cathflo dwell. Safety maintained.

## 2021-11-09 NOTE — Progress Notes (Signed)
Pt HD catheter will not function for HD. KDU Charge RN contacted for further instructions. Pt rinsed back, alert, vss, no c/o. Catheter heparin locked. Report to ICU RN.

## 2021-11-09 NOTE — Progress Notes (Signed)
Pt. Can come to [facility] after speaking with Dr. Joelyn Oms

## 2021-11-09 NOTE — Progress Notes (Signed)
HD tx start at 1150a. Catheter lines reversed d/t high ap and vp. Pt alert, primary MD present, no c/o, safety maintained, HD RN at bedside.

## 2021-11-09 NOTE — Progress Notes (Signed)
Subjective:   Seen in room No pressors No overnight events No UOP  Objective Vital signs in last 24 hours: Vitals:   11/09/21 0500 11/09/21 0635 11/09/21 0700 11/09/21 0816  BP: 114/62 117/69 107/70   Pulse: 84 85 85   Resp: 19 (!) 33 (!) 25   Temp:    98 F (36.7 C)  TempSrc:    Oral  SpO2: 96% 97% 99%   Weight:      Height:       Weight change: -1.2 kg  Intake/Output Summary (Last 24 hours) at 11/09/2021 0840 Last data filed at 11/09/2021 0825 Gross per 24 hour  Intake 1573.67 ml  Output 500 ml  Net 1073.67 ml    Assessment/Plan:     61 y.o. yo male  with HTN, HLD, DM, A-fib, stroke, CKD, CAD status post CABG on 8/3 presented with SOB, found cardiac tamponade, course complicated by cardiac arrest, use of ECMO, seen as a consultation for AKI    #Anuric Dialysis dependent Acute kidney injury on CKD IIIa.   Ischemic ATN due to cardiogenic shock/cardiac arrest.   Started CRRT on 8/28 for decreasing urine output and fluid/volume management.  -  stopped CRRT on 9/19. Tolerating iHD, did not req pressors last Tx but remains on high dose midodrine support, on THS schedule currently S/p L IJ TDC with IR 9/25 HD 9/30. 2K, 2L UF BP permitting, tight heparin Getting close to stability to come to [place] for HD   #Cardiac tamponade/hemorrhagic pericarditis status post pericardial drain placement with subsequent ECMO placement.  decannulated  with placement of Impella.  S/p closure of the sternum and mediastinal washout on 9/2.   #Cardiogenic shock/cardiac arrest: Was on ECMO.   midodrine per primary/AHF team.  Impella out on 9/7- remarkable improvement -     #Acute hypoxic respiratory failure:  improving; decannulated  #Acute blood loss anemia: Monitor hemoglobin and transfuse as needed by ICU team.  Aranesp 100 weekly. 9/25 TSAT 10% and Ferritin 215, rec IV Fe  [name]    Labs: Basic Metabolic Panel: Recent Labs  Lab 11/07/21 0229 11/08/21 0420 11/09/21 0606  NA  135 135 138  K 4.2 3.8 4.0  CL 93* 91* 96*  CO2 24 28 26   GLUCOSE 168* 135* 148*  BUN 91* 48* 85*  CREATININE 5.20* 3.29* 4.83*  CALCIUM 9.2 9.2 9.3  PHOS 3.7 2.8 3.2    Liver Function Tests: Recent Labs  Lab 11/07/21 0229 11/08/21 0420 11/09/21 0606  ALBUMIN 2.4* 2.7* 2.6*    No results for input(s): "LIPASE", "AMYLASE" in the last 168 hours. No results for input(s): "AMMONIA" in the last 168 hours. CBC: Recent Labs  Lab 11/05/21 1209 11/06/21 0859 11/07/21 0229 11/08/21 0420 11/09/21 0606  WBC 18.4* 17.9* 18.8* 13.6* 13.1*  HGB 6.1* 8.6* 8.8* 8.5* 8.3*  HCT 20.5* 27.9* 28.1* 28.8* 27.4*  MCV 99.5 95.5 95.6 98.3 97.5  PLT 433* 359 392 393 440*    Cardiac Enzymes: No results for input(s): "CKTOTAL", "CKMB", "CKMBINDEX", "TROPONINI" in the last 168 hours.  CBG: Recent Labs  Lab 11/08/21 1631 11/08/21 2103 11/09/21 0038 11/09/21 0426 11/09/21 0814  GLUCAP 179* 150* 144* 101* 151*     Iron Studies: No results for input(s): "IRON", "TIBC", "TRANSFERRIN", "FERRITIN" in the last 72 hours.  Studies/Results: No results found. Medications: Infusions:  sodium chloride 10 mL/hr at 11/09/21 0643   sodium chloride Stopped (11/04/21 1021)   albumin human 12.5 g (11/01/21 2248)  anticoagulant sodium citrate     ferric gluconate (FERRLECIT) IVPB Stopped (11/08/21 1107)   norepinephrine (LEVOPHED) Adult infusion Stopped (11/06/21 0631)    Scheduled Medications:  sodium chloride   Intravenous Once   ascorbic acid  250 mg Per Tube BID   aspirin  81 mg Per Tube Daily   Chlorhexidine Gluconate Cloth  6 each Topical Daily   collagenase   Topical Daily   [START ON 11/12/2021] darbepoetin (ARANESP) injection - DIALYSIS  100 mcg Intravenous Q Tue-HD   feeding supplement (PROSource TF20)  60 mL Per Tube TID   feeding supplement (VITAL 1.5 CAL)  1,000 mL Per Tube Q24H   fentaNYL  1 patch Transdermal Q72H   fiber supplement (BANATROL TF)  60 mL Per Tube TID   folic  acid  1 mg Per Tube Daily   Gerhardt's butt cream   Topical Daily   heparin injection (subcutaneous)  5,000 Units Subcutaneous Q8H   insulin aspart  0-20 Units Subcutaneous Q4H   insulin aspart  2 Units Subcutaneous Q4H   insulin glargine-yfgn  10 Units Subcutaneous QHS   midodrine  20 mg Per Tube TID WC   multivitamin  1 tablet Per Tube QHS   nutrition supplement (JUVEN)  1 packet Per Tube BID BM   mouth rinse  15 mL Mouth Rinse 4 times per day   pantoprazole  40 mg Per Tube BID   QUEtiapine  50 mg Per Tube QHS   sodium chloride flush  3 mL Intravenous Q12H   thiamine  100 mg Per Tube Daily    have reviewed scheduled and prn medications.  Physical Exam: General:  resting, nad Heart: RRR Lungs: mostly clear Abdomen: slightly distended Extremities: no edema Dialysis Access: L IJ TDC bandaged   11/09/2021,8:40 AM  LOS: 42 days

## 2021-11-09 NOTE — Progress Notes (Signed)
patient in Icu unit.  Alert and oriented. Informed consent signed and in chart.   Treatment initiated: 1730 Treatment completed: 2130  Patient tolerated well.  Alert, without acute distress.  Hand-off given to patient's nurse.   Access used: catheter Access issues: none  Total UF removed: 2000 Medication(s) given: none Post HD VS: 98.1 125/92 89 13 97 RA Post HD weight: unable to obtain   [name]

## 2021-11-09 NOTE — Progress Notes (Signed)
23 Days Post-Op Procedure(s) (LRB): REMOVAL OF IMPELLA LEFT VENTRICULAR ASSIST DEVICE (Right) TRANSESOPHAGEAL ECHOCARDIOGRAM (TEE) (N/A) PERCUTANEOUS TRACHEOSTOMY USING SHILEY FLEXIBLE 8 mm CUFFED TRACH. (N/A) Subjective: Frustrated with limitations  Objective: Vital signs in last 24 hours: Temp:  [96.6 F (35.9 C)-98.2 F (36.8 C)] 98 F (36.7 C) (09/30 0816) Pulse Rate:  [80-90] 82 (09/30 0900) Cardiac Rhythm: Normal sinus rhythm (09/30 0715) Resp:  [12-33] 25 (09/30 0900) BP: (99-143)/(58-113) 106/62 (09/30 0900) SpO2:  [94 %-100 %] 98 % (09/30 0900) Weight:  [90.2 kg] 90.2 kg (09/30 0455)  Hemodynamic parameters for last 24 hours:    Intake/Output from previous day: 09/29 0701 - 09/30 0700 In: 1604.5 [I.V.:13.5; JQ/ZE:0923; IV Piggyback:140] Out: 500 [Stool:500] Intake/Output this shift: Total I/O In: 192.5 [I.V.:12.5; NG/GT:180] Out: 30 [Stool:30]  General appearance: alert, cooperative, and no distress Neurologic: no focal motor deficit but generalized weakness Heart: regular rate and rhythm Lungs: diminished breath sounds bibasilar Wound: dressing clean and dry  Lab Results: Recent Labs    11/08/21 0420 11/09/21 0606  WBC 13.6* 13.1*  HGB 8.5* 8.3*  HCT 28.8* 27.4*  PLT 393 440*   BMET:  Recent Labs    11/08/21 0420 11/09/21 0606  NA 135 138  K 3.8 4.0  CL 91* 96*  CO2 28 26  GLUCOSE 135* 148*  BUN 48* 85*  CREATININE 3.29* 4.83*  CALCIUM 9.2 9.3    PT/INR: No results for input(s): "LABPROT", "INR" in the last 72 hours. ABG    Component Value Date/Time   PHART 7.301 (L) 10/22/2021 2354   HCO3 19.3 (L) 10/22/2021 2354   TCO2 20 (L) 10/22/2021 2354   ACIDBASEDEF 7.0 (H) 10/22/2021 2354   O2SAT 71.7 11/02/2021 0439   CBG (last 3)  Recent Labs    11/09/21 0038 11/09/21 0426 11/09/21 0814  GLUCAP 144* 101* 151*    Assessment/Plan: S/P Procedure(s) (LRB): REMOVAL OF IMPELLA LEFT VENTRICULAR ASSIST DEVICE (Right) TRANSESOPHAGEAL  ECHOCARDIOGRAM (TEE) (N/A) PERCUTANEOUS TRACHEOSTOMY USING SHILEY FLEXIBLE 8 mm CUFFED TRACH. (N/A) - Ambulated yesterday with extensive assistance Did not tolerate FEES well yesterday Continue current care measures  LOS: 42 days    [place] Nakayama 11/09/2021

## 2021-11-09 NOTE — Progress Notes (Signed)
NAME:  [name], MRN:  [id number], DOB:  Oct 11, 1960, LOS: 82 ADMISSION DATE:  09/28/2021, CONSULTATION DATE:  09/29/2021 REFERRING MD:  Kipp Brood - TCTS CHIEF COMPLAINT: Dyspnea   History of Present Illness:  61 year old man who underwent CABG on 8/3, readmitted on 8/19 in setting of dyspnea with a pericardial effusion requiring pericardial window via bedside sternotomy. Also with bilateral pleural effusions. Sustained PEA arrest on 8/20, briefly required [facility] ECMO which was discontinued on 8/28, Impella placed 8/28, started on CRRT.  Hypoxemia, repeat cardiac arrest on 8/30. Underwent mediastinal washout/sternotomy closure 9/2. Impella removed and tracheostomy completed 9/7.  Pertinent Medical History:  CAD s/p CABG 8/3 DM2 Diverticulosis GERD Hyperlipidemia Hypertension History of stroke Tobacco Abuse  Significant Hospital Events: Including procedures, antibiotic start and stop dates in addition to other pertinent events   8/19 Admitted, s/p pericardial window 8/20 PEA cardiac arrest, left pigtail chest tube placement, PEA cardiac arrest due to tamponade, bedside sternotomy with pericardial drains placed. 8/21 Return to OR for overnight bleeding.  8/22 Minimal chest tube output.  Atrial fibrillation, controled with amiodarone.  8/22 TEE showed EF 35% at baseline, which improved significantly with decreasing ECMO flow.  8/23 Tolerated diuresis  8/24 Mediastinal washout, removal of hematoma 8/25 Ongoing bleeding issues from posterior mediastinum 8/28 Decannulated and off ECMO, 5.5 Impella placed, L femoral HD cath placed and started on CRRT 8/30 Hypoxia and hypotension followed by brief arrest, gentle L lateral chest compressions performed and ROSC in roughly 1 minute. 8/31 Remains deeply sedated on ventilator with open chest.  No acute events overnight. 9/1 No acute events overnight, remains on ventilator deep sedation.  Tolerating aggressive volume removal per CRRT 9/2 Sternotomy  closure 9/3 Wean versed 9/4 FOB, low grade temps, notable thick mucus/frequent suctioning 9/5 Vomiting, ?mucus plugging. PSV 5-6 hours on 10/5 9/6 Impella down to p3 9/7 Impella removal and trach. PRBC and DDAVP in OR  9/8 Low dose epi, weaned on 8/8 9/9 RIJ HD catheter placed 9/11 Started on ATC at 28% and tolerating well 9/12 Remains on 28% ATC 9/13 SB/Junction rhythm with associated hypotension overnight requiring transient pressor escalation. Pressor requirement down in AM. WBC count 23 (18), Co-ox 36%. Resp/Blood Cx. 9/14 Mild bradycardia/relative hypotension with Precedex. Remains on Epi 73mcg, Vaso 0.03. Resp Cx with few gram+ cocci (clusters), yeast. BCx NGTD. 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 Co-ox 48%. CRRT stopped. 9/15 WBC continues to downtrend. Hgb 7.6. Net +1.49L/24H. CRRT resumed. Co-ox 51%. NE started to help with Epi wean. 9/16, Co. oximetry improved, WBC trending down, weaning pressors 9/19 on trach collar for 96 hours.  Weaning off vasopressors.  Trial off CRRT today.  Episode of overnight emesis. 9/20 more awake. CRRT off 9/21 cuffless tracheostomy. Do well with PMV 9/22 failed swallow evaluation 9/25 Tolerating trach capping on RA. Up to chair. Trach decannulation today. Tunneled dialysis catheter placement with IR. 9/26 Doing well post-trach decannulation. Able to phonate with light pressure. S/p TDC with IR, some catheter flow issues overnight. Reattempting HD today.  Interim History / Subjective:  Sacral pain is only complaint. On HD this morning.  Objective:  Blood pressure (!) 112/56, pulse 80, temperature 97.7 F (36.5 C), temperature source Oral, resp. rate 18, height 5\' 7"  (1.702 m), weight 90.2 kg, SpO2 98 %.        Intake/Output Summary (Last 24 hours) at 11/09/2021 1905 Last data filed at 11/09/2021 1800 Gross per 24 hour  Intake 2785.73 ml  Output  1230 ml  Net 1555.73 ml    Filed Weights   11/08/21 0025  11/08/21 0418 11/09/21 0455  Weight: 88.6 kg 88.6 kg 90.2 kg   Physical Examination:  General:  middle aged man lying in bed in NAD Neuro:  awake and alert, moving all extremities but globally weak HEENT:  [place]/AT, eyes anicteric Cardiovascular: S1S2, RRR Lungs:  breathing comfortably on RA, CTAB Abdomen:  soft, NT  Musculoskeletal: no LE edema, muscle wasting, no cyanosis  Skin:  sacral decub not examined, no diffuse rashes. Sternal incision bandaged  WBC 13.1, downtrending H/H 8.3/27.4 Platelets 440 BUN 85 Cr 4.83   Assessment & Plan:   Acute metabolic encephalopathy.  C/w uncontrolled pain and ICU delirium. Improving. 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 EF now normal. Hx of afib- current sinus, no recurrence recently. Tolerates very poorly. Acute respiratory failure with hypoxemia and hypercarbia- resolved Sacral pressure wound not POA- WOC consult help appreciated AKI, now likely HD dependent. Tunneled catheter in place. 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 -OOB mobility, needs aggressive rehabilitation -fentanyl patch, trying to wean off IV dilaudid. Has oxycodone and clonazepam down tube. Con't seroquel. -con't TF via NGT and ongoing work with SLP -sacral and sternal wounds care   Best Practice (right click and "Reselect all SmartList Selections" daily)  Diet/type: tubefeeds DVT prophylaxis: prophylactic heparin  GI prophylaxis: PPI Lines: N/A Foley:  N/A Code Status:  full code Last date of multidisciplinary goals of care discussion patient updated daily.   [name], DO 11/09/21 7:10 PM [facility]   Please see [email or web address]  for pager details.  From 7A-7P if no response, please call [phone] After hours, please call ELink [phone]

## 2021-11-09 NOTE — Progress Notes (Signed)
Pre HD RN assessment 

## 2021-11-10 DIAGNOSIS — R29898 Other symptoms and signs involving the musculoskeletal system: Secondary | ICD-10-CM

## 2021-11-10 LAB — RENAL FUNCTION PANEL
Albumin: 2.8 g/dL — ABNORMAL LOW (ref 3.5–5.0)
Anion gap: 16 — ABNORMAL HIGH (ref 5–15)
BUN: 51 mg/dL — ABNORMAL HIGH (ref 8–23)
CO2: 27 mmol/L (ref 22–32)
Calcium: 9.4 mg/dL (ref 8.9–10.3)
Chloride: 94 mmol/L — ABNORMAL LOW (ref 98–111)
Creatinine, Ser: 3.44 mg/dL — ABNORMAL HIGH (ref 0.61–1.24)
GFR, Estimated: 19 mL/min — ABNORMAL LOW (ref >60)
Glucose, Bld: 112 mg/dL — ABNORMAL HIGH (ref 70–99)
Phosphorus: 3.1 mg/dL (ref 2.5–4.6)
Potassium: 4 mmol/L (ref 3.5–5.1)
Sodium: 137 mmol/L (ref 135–145)

## 2021-11-10 LAB — CBC
HCT: 32.9 % — ABNORMAL LOW (ref 39.0–52.0)
Hemoglobin: 9.6 g/dL — ABNORMAL LOW (ref 13.0–17.0)
MCH: 29 pg (ref 26.0–34.0)
MCHC: 29.2 g/dL — ABNORMAL LOW (ref 30.0–36.0)
MCV: 99.4 fL (ref 80.0–100.0)
Platelets: 442 10*3/uL — ABNORMAL HIGH (ref 150–400)
RBC: 3.31 MIL/uL — ABNORMAL LOW (ref 4.22–5.81)
RDW: 20.2 % — ABNORMAL HIGH (ref 11.5–15.5)
WBC: 12.4 10*3/uL — ABNORMAL HIGH (ref 4.0–10.5)
nRBC: 0.4 % — ABNORMAL HIGH (ref 0.0–0.2)

## 2021-11-10 LAB — GLUCOSE, CAPILLARY
Glucose-Capillary: 112 mg/dL — ABNORMAL HIGH (ref 70–99)
Glucose-Capillary: 119 mg/dL — ABNORMAL HIGH (ref 70–99)
Glucose-Capillary: 132 mg/dL — ABNORMAL HIGH (ref 70–99)
Glucose-Capillary: 145 mg/dL — ABNORMAL HIGH (ref 70–99)
Glucose-Capillary: 146 mg/dL — ABNORMAL HIGH (ref 70–99)
Glucose-Capillary: 150 mg/dL — ABNORMAL HIGH (ref 70–99)
Glucose-Capillary: 151 mg/dL — ABNORMAL HIGH (ref 70–99)

## 2021-11-10 LAB — MAGNESIUM: Magnesium: 1.6 mg/dL — ABNORMAL LOW (ref 1.7–2.4)

## 2021-11-10 MED ORDER — BANATROL TF EN LIQD
60.0000 mL | Freq: Four times a day (QID) | ENTERAL | Status: DC
  Administered 2021-11-10 – 2021-11-24 (×35): 60 mL
  Filled 2021-11-10 (×52): qty 60

## 2021-11-10 NOTE — Progress Notes (Signed)
Subjective:   Seen in room No pressors HD yesterday, req tPA dwell for cath patency but was effective; 2L UF No UOP  Objective Vital signs in last 24 hours: Vitals:   11/10/21 0415 11/10/21 0500 11/10/21 0600 11/10/21 0815  BP:      Pulse:  91 84   Resp:  19 (!) 41   Temp: 97.7 F (36.5 C)   98.4 F (36.9 C)  TempSrc: Axillary   Oral  SpO2:  99% 94%   Weight:      Height:       Weight change: 0.5 kg  Intake/Output Summary (Last 24 hours) at 11/10/2021 6301 Last data filed at 11/10/2021 0600 Gross per 24 hour  Intake 2159.73 ml  Output 3250 ml  Net -1090.27 ml    Assessment/Plan:     61 y.o. yo male  with HTN, HLD, DM, A-fib, stroke, CKD, CAD status post CABG on 8/3 presented with SOB, found cardiac tamponade, course complicated by cardiac arrest, use of ECMO, seen as a consultation for AKI    #Anuric Dialysis dependent Acute kidney injury on CKD IIIa.   Ischemic ATN due to cardiogenic shock/cardiac arrest.   Started CRRT on 8/28 for decreasing urine output and fluid/volume management.  -  stopped CRRT on 9/19. Tolerating iHD, no longer req pressors with HD S/p L IJ TDC with IR 9/25 HD usual Rx: 2K, 2L UF BP permitting, tight heparin Getting close to stability to come to [place] for HD   #Cardiac tamponade/hemorrhagic pericarditis status post pericardial drain placement with subsequent ECMO placement.  decannulated  with placement of Impella.  S/p closure of the sternum and mediastinal washout on 9/2.   #Cardiogenic shock/cardiac arrest: Was on ECMO.   midodrine per primary/AHF team.  Impella out on 9/7- remarkable improvement -     #Acute hypoxic respiratory failure:  resolved.  Decannulated.  Stable  #Acute blood loss anemia: Monitor hemoglobin and transfuse as needed by ICU team.  Aranesp 100 weekly. 9/25 TSAT 10% and Ferritin 215, rec IV Fe  [place]    Labs: Basic Metabolic Panel: Recent Labs  Lab 11/07/21 0229 11/08/21 0420 11/09/21 0606  NA 135 135  138  K 4.2 3.8 4.0  CL 93* 91* 96*  CO2 24 28 26   GLUCOSE 168* 135* 148*  BUN 91* 48* 85*  CREATININE 5.20* 3.29* 4.83*  CALCIUM 9.2 9.2 9.3  PHOS 3.7 2.8 3.2    Liver Function Tests: Recent Labs  Lab 11/07/21 0229 11/08/21 0420 11/09/21 0606  ALBUMIN 2.4* 2.7* 2.6*    No results for input(s): "LIPASE", "AMYLASE" in the last 168 hours. No results for input(s): "AMMONIA" in the last 168 hours. CBC: Recent Labs  Lab 11/05/21 1209 11/06/21 0859 11/07/21 0229 11/08/21 0420 11/09/21 0606  WBC 18.4* 17.9* 18.8* 13.6* 13.1*  HGB 6.1* 8.6* 8.8* 8.5* 8.3*  HCT 20.5* 27.9* 28.1* 28.8* 27.4*  MCV 99.5 95.5 95.6 98.3 97.5  PLT 433* 359 392 393 440*    Cardiac Enzymes: No results for input(s): "CKTOTAL", "CKMB", "CKMBINDEX", "TROPONINI" in the last 168 hours.  CBG: Recent Labs  Lab 11/09/21 2012 11/09/21 2350 11/10/21 0118 11/10/21 0410 11/10/21 0748  GLUCAP 119* 148* 146* 132* 112*     Iron Studies: No results for input(s): "IRON", "TIBC", "TRANSFERRIN", "FERRITIN" in the last 72 hours.  Studies/Results: No results found. Medications: Infusions:  sodium chloride Stopped (11/09/21 0715)   sodium chloride Stopped (11/04/21 1021)   albumin human 12.5 g (11/01/21  2248)   anticoagulant sodium citrate     ferric gluconate (FERRLECIT) IVPB Stopped (11/09/21 1042)   norepinephrine (LEVOPHED) Adult infusion Stopped (11/06/21 0631)    Scheduled Medications:  sodium chloride   Intravenous Once   ascorbic acid  250 mg Per Tube BID   aspirin  81 mg Per Tube Daily   Chlorhexidine Gluconate Cloth  6 each Topical Daily   collagenase   Topical Daily   [START ON 11/12/2021] darbepoetin (ARANESP) injection - DIALYSIS  100 mcg Intravenous Q Tue-HD   feeding supplement (PROSource TF20)  60 mL Per Tube TID   feeding supplement (VITAL 1.5 CAL)  1,000 mL Per Tube Q24H   fentaNYL  1 patch Transdermal Q72H   fiber supplement (BANATROL TF)  60 mL Per Tube TID   folic acid  1 mg  Per Tube Daily   Gerhardt's butt cream   Topical Daily   heparin injection (subcutaneous)  5,000 Units Subcutaneous Q8H   insulin aspart  0-20 Units Subcutaneous Q4H   insulin aspart  2 Units Subcutaneous Q4H   insulin glargine-yfgn  10 Units Subcutaneous QHS   midodrine  20 mg Per Tube TID WC   multivitamin  1 tablet Per Tube QHS   nutrition supplement (JUVEN)  1 packet Per Tube BID BM   mouth rinse  15 mL Mouth Rinse 4 times per day   pantoprazole  40 mg Per Tube BID   QUEtiapine  50 mg Per Tube QHS   sodium chloride flush  3 mL Intravenous Q12H   thiamine  100 mg Per Tube Daily    have reviewed scheduled and prn medications.  Physical Exam: General:  resting, nad Heart: RRR Lungs: mostly clear Abdomen: slightly distended Extremities: no edema Dialysis Access: L IJ TDC bandaged   11/10/2021,8:32 AM  LOS: 43 days

## 2021-11-10 NOTE — Progress Notes (Signed)
24 Days Post-Op Procedure(s) (LRB): REMOVAL OF IMPELLA LEFT VENTRICULAR ASSIST DEVICE (Right) TRANSESOPHAGEAL ECHOCARDIOGRAM (TEE) (N/A) PERCUTANEOUS TRACHEOSTOMY USING SHILEY FLEXIBLE 8 mm CUFFED TRACH. (N/A) Subjective: Up in chair, comfortable at present  Objective: Vital signs in last 24 hours: Temp:  [97.7 F (36.5 C)-98.5 F (36.9 C)] 97.9 F (36.6 C) (10/01 1554) Pulse Rate:  [80-92] 92 (10/01 1801) Cardiac Rhythm: Normal sinus rhythm (10/01 0800) Resp:  [13-41] 16 (10/01 1801) BP: (86-141)/(50-92) 116/85 (10/01 1801) SpO2:  [91 %-100 %] 97 % (10/01 1801) Weight:  [90.7 kg] 90.7 kg (10/01 0355)  Hemodynamic parameters for last 24 hours:    Intake/Output from previous day: 09/30 0701 - 10/01 0700 In: 2282.2 [I.V.:42.5; NG/GT:2120; IV Piggyback:119.7] Out: 3280 [Stool:1280] Intake/Output this shift: Total I/O In: 1054.6 [NG/GT:950; IV Piggyback:104.6] Out: -   General appearance: alert, cooperative, and no distress Neurologic: generalized weakness, slowing Heart: regular rate and rhythm Lungs: coarse BS bilaterally  Lab Results: Recent Labs    11/09/21 0606 11/10/21 0829  WBC 13.1* 12.4*  HGB 8.3* 9.6*  HCT 27.4* 32.9*  PLT 440* 442*   BMET:  Recent Labs    11/09/21 0606 11/10/21 0829  NA 138 137  K 4.0 4.0  CL 96* 94*  CO2 26 27  GLUCOSE 148* 112*  BUN 85* 51*  CREATININE 4.83* 3.44*  CALCIUM 9.3 9.4    PT/INR: No results for input(s): "LABPROT", "INR" in the last 72 hours. ABG    Component Value Date/Time   PHART 7.301 (L) 10/22/2021 2354   HCO3 19.3 (L) 10/22/2021 2354   TCO2 20 (L) 10/22/2021 2354   ACIDBASEDEF 7.0 (H) 10/22/2021 2354   O2SAT 71.7 11/02/2021 0439   CBG (last 3)  Recent Labs    11/10/21 0748 11/10/21 1205 11/10/21 1553  GLUCAP 112* 145* 151*    Assessment/Plan: S/P Procedure(s) (LRB): REMOVAL OF IMPELLA LEFT VENTRICULAR ASSIST DEVICE (Right) TRANSESOPHAGEAL ECHOCARDIOGRAM (TEE) (N/A) PERCUTANEOUS TRACHEOSTOMY  USING SHILEY FLEXIBLE 8 mm CUFFED TRACH. (N/A) - NEURO- delirium improving CV- Off pressors, EF normal last check  Off mechanical support and pressors RESP- Stable, decannulated several days ago without issue RENAL- ARF- intermittent HD GI/ Nutrition- on TF, stool output up over the past couple of days ENDO- CBG well controlled with current regimen ID-afebrile off antibiotics Deconditioning- severe Recheck chest and sacral wounds tomorrow   LOS: 43 days    [place] Nakayama 11/10/2021

## 2021-11-10 NOTE — Progress Notes (Signed)
NAME:  [name], MRN:  [id number], DOB:  [birth year], LOS: 74 ADMISSION DATE:  09/28/2021, CONSULTATION DATE:  09/29/2021 REFERRING MD:  [name] - TCTS CHIEF COMPLAINT: Dyspnea   History of Present Illness:  61 year old man who underwent CABG on 8/3, readmitted on 8/19 in setting of dyspnea with a pericardial effusion requiring pericardial window via bedside sternotomy. Also with bilateral pleural effusions. Sustained PEA arrest on 8/20, briefly required VA ECMO which was discontinued on 8/28, Impella placed 8/28, started on CRRT.  Hypoxemia, repeat cardiac arrest on 8/30. Underwent mediastinal washout/sternotomy closure 9/2. Impella removed and tracheostomy completed 9/7.  Pertinent Medical History:  CAD s/p CABG 8/3 DM2 Diverticulosis GERD Hyperlipidemia Hypertension History of stroke Tobacco Abuse  Significant Hospital Events: Including procedures, antibiotic start and stop dates in addition to other pertinent events   8/19 Admitted, s/p pericardial window 8/20 PEA cardiac arrest, left pigtail chest tube placement, PEA cardiac arrest due to tamponade, bedside sternotomy with pericardial drains placed. 8/21 Return to OR for overnight bleeding.  8/22 Minimal chest tube output.  Atrial fibrillation, controled with amiodarone.  8/22 TEE showed EF 35% at baseline, which improved significantly with decreasing ECMO flow.  8/23 Tolerated diuresis  8/24 Mediastinal washout, removal of hematoma 8/25 Ongoing bleeding issues from posterior mediastinum 8/28 Decannulated and off ECMO, 5.5 Impella placed, L femoral HD cath placed and started on CRRT 8/30 Hypoxia and hypotension followed by brief arrest, gentle L lateral chest compressions performed and ROSC in roughly 1 minute. 8/31 Remains deeply sedated on ventilator with open chest.  No acute events overnight. 9/1 No acute events overnight, remains on ventilator deep sedation.  Tolerating aggressive volume removal per CRRT 9/2 Sternotomy  closure 9/3 Wean versed 9/4 FOB, low grade temps, notable thick mucus/frequent suctioning 9/5 Vomiting, ?mucus plugging. PSV 5-6 hours on 10/5 9/6 Impella down to p3 9/7 Impella removal and trach. PRBC and DDAVP in OR  9/8 Low dose epi, weaned on 8/8 9/9 RIJ HD catheter placed 9/11 Started on ATC at 28% and tolerating well 9/12 Remains on 28% ATC 9/13 SB/[place] rhythm with associated hypotension overnight requiring transient pressor escalation. Pressor requirement down in AM. WBC count 23 (18), Co-ox 36%. Resp/Blood Cx. 9/14 Mild bradycardia/relative hypotension with Precedex. Remains on Epi 21mcg, Vaso 0.03. Resp Cx with few gram+ cocci (clusters), yeast. BCx NGTD. 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 Co-ox 48%. CRRT stopped. 9/15 WBC continues to downtrend. Hgb 7.6. Net +1.49L/24H. CRRT resumed. Co-ox 51%. NE started to help with Epi wean. 9/16, Co. oximetry improved, WBC trending down, weaning pressors 9/19 on trach collar for 96 hours.  Weaning off vasopressors.  Trial off CRRT today.  Episode of overnight emesis. 9/20 more awake. CRRT off 9/21 cuffless tracheostomy. Do well with PMV 9/22 failed swallow evaluation 9/25 Tolerating trach capping on RA. Up to chair. Trach decannulation today. Tunneled dialysis catheter placement with IR. 9/26 Doing well post-trach decannulation. Able to phonate with light pressure. S/p TDC with IR, some catheter flow issues overnight. Reattempting HD today.  Interim History / Subjective:  [name] denies complaints currently.  Objective:  Blood pressure (!) 93/59, pulse 85, temperature 98.4 F (36.9 C), temperature source Oral, resp. rate 18, height 5\' 7"  (1.702 m), weight 90.7 kg, SpO2 96 %.        Intake/Output Summary (Last 24 hours) at 11/10/2021 0917 Last data filed at 11/10/2021 0800 Gross per 24 hour  Intake 2269.73 ml  Output 3250 ml  Net -  980.27 ml    Filed Weights   11/08/21 0418 11/09/21 0455  11/10/21 0355  Weight: 88.6 kg 90.2 kg 90.7 kg   Physical Examination:  General:  chronically ill appearing man lying in bed in NAD Neuro:  awake, alert, answering questions appropriately, not oriented to day or date. Globally weak. HEENT:  [place]/AT, eyes anicteric Cardiovascular: S1S2, RRR Lungs:  breathing comfortably on RA, no wheezing or rhonchi. Abdomen:  nondistended, flexiseal draining liquid stool Musculoskeletal: no LE edema, no cyanosis  Skin: sacral decub clean, healthy tissue healing in stage II   AM labs not yet collected    Assessment & Plan:   Acute metabolic encephalopathy.  Mostly due to ICU delirium at this point, improving.   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 EF now normal. Hx of afib- current sinus, no recurrence recently. Tolerates very poorly. Acute respiratory failure with hypoxemia and hypercarbia- resolved Sacral pressure wound not POA- wound care, con't to unload this area.  AKI, now likely HD dependent. Tunneled catheter in place. Vasoplegia from prolonged critical illness- on midodrine Dysphagia- TF ongoing, SLP working with patient -con't midodrine -iHD TThS -needs aggressive rehab, OOB mobility -fentanyl patch, trying to wean off IV dilaudid. Has oxycodone and clonazepam down tube. Con't seroquel. -Con't TF and NGT, con't rehab efforts with SLP. -sacral and sternal wounds-- con't wound care -Frequent reorientation   Best Practice (right click and "Reselect all SmartList Selections" daily)  Diet/type: tubefeeds DVT prophylaxis: prophylactic heparin  GI prophylaxis: PPI Lines: N/A Foley:  N/A Code Status:  full code Last date of multidisciplinary goals of care discussion patient updated daily.   Mark Hy, DO 11/10/21 10:21  AM [facility]   Please see [email or web address] for pager details.  From 7A-7P if no response, please call [phone] After hours, please call ELink [phone]

## 2021-11-11 LAB — CBC
HCT: 29.7 % — ABNORMAL LOW (ref 39.0–52.0)
Hemoglobin: 8.7 g/dL — ABNORMAL LOW (ref 13.0–17.0)
MCH: 28.6 pg (ref 26.0–34.0)
MCHC: 29.3 g/dL — ABNORMAL LOW (ref 30.0–36.0)
MCV: 97.7 fL (ref 80.0–100.0)
Platelets: 437 10*3/uL — ABNORMAL HIGH (ref 150–400)
RBC: 3.04 MIL/uL — ABNORMAL LOW (ref 4.22–5.81)
RDW: 19.6 % — ABNORMAL HIGH (ref 11.5–15.5)
WBC: 10.9 10*3/uL — ABNORMAL HIGH (ref 4.0–10.5)
nRBC: 0.6 % — ABNORMAL HIGH (ref 0.0–0.2)

## 2021-11-11 LAB — RENAL FUNCTION PANEL
Albumin: 2.5 g/dL — ABNORMAL LOW (ref 3.5–5.0)
Anion gap: 18 — ABNORMAL HIGH (ref 5–15)
BUN: 80 mg/dL — ABNORMAL HIGH (ref 8–23)
CO2: 24 mmol/L (ref 22–32)
Calcium: 9.3 mg/dL (ref 8.9–10.3)
Chloride: 94 mmol/L — ABNORMAL LOW (ref 98–111)
Creatinine, Ser: 4.75 mg/dL — ABNORMAL HIGH (ref 0.61–1.24)
GFR, Estimated: 13 mL/min — ABNORMAL LOW (ref >60)
Glucose, Bld: 124 mg/dL — ABNORMAL HIGH (ref 70–99)
Phosphorus: 2.8 mg/dL (ref 2.5–4.6)
Potassium: 4.2 mmol/L (ref 3.5–5.1)
Sodium: 136 mmol/L (ref 135–145)

## 2021-11-11 LAB — MAGNESIUM: Magnesium: 1.7 mg/dL (ref 1.7–2.4)

## 2021-11-11 LAB — GLUCOSE, CAPILLARY
Glucose-Capillary: 124 mg/dL — ABNORMAL HIGH (ref 70–99)
Glucose-Capillary: 160 mg/dL — ABNORMAL HIGH (ref 70–99)
Glucose-Capillary: 114 mg/dL — ABNORMAL HIGH (ref 70–99)
Glucose-Capillary: 175 mg/dL — ABNORMAL HIGH (ref 70–99)
Glucose-Capillary: 113 mg/dL — ABNORMAL HIGH (ref 70–99)

## 2021-11-11 MED ORDER — GERHARDT'S BUTT CREAM: TOPICAL_CREAM | Freq: Every day | CUTANEOUS | Status: DC

## 2021-11-11 NOTE — Progress Notes (Addendum)
Patient ID: [name], male   DOB: 09-Sep-1960, 61 y.o.   MRN: [id number]   Advanced Heart Failure Rounding Note   Subjective:    - 8/19 Pericardial window - 8/20 Cardiac arrest with tamponade -> Emergent bedside washout - 09/29/21 VA Cannulation - 09/30/21 Return to OR for mediastinal hemorrhage - 10/01/21 Developed AF -> amio - 10/02/21 TEE EF 25-30%  - 10/04/21 OR for washout. C/b continued bleeding overnight - 10/07/21 Placement of Impella 5.5 with washout, VA ECMO decannulation. Hypotensive with development of severe RV dysfunction after ECMO off and pressors titrated up. Multiple units of blood products. - 10/08/21 Brief PEA arrest. AFL with RVR >> S/p DCCV to SR, back in AFL shortly after - 8/31 Give 1UPRBCs  - 9/2 OR for chest closure - 9/3 Hypotensive overnight w/ SBPs in 80s. Febrile, mTemp 100.8. CRRT paused. VP increased to 0.04.  - 9/4 s/p bronchoscopy w/ BAL by PCCM, Cx NGTD  - 9/5 vomiting w/ large volume NGT output + watery/foul diarrhea, Tube feeds held. No signs of ileus on KUB. C-diff negative   - 9/7 OR for Impella Extraction and percutaneous tracheostomy. 2 u RBCs. - 9/8 CVVH stopped and line removed for holiday - 9/9 CVVH restarted.  -9/13 Worsening leukocytosis and pressor requirements. Purulent drainage from arterial line. Started Daptomycin, Micafungin and Meropenem. - 9/17 Daptomycin + Micafungin stopped. Remains on Meropenum  -9/19 CRRT holiday.  -9/20 Trach downsized  -9/21 transitioned to iHD  - 9/25 Tunneled HD cath placed. Trach decannulation - 9/26 Hgb 6.1>>transfused 2uRBC   Complaining of buttock pain. Denies SOB.   Objective:     Vital Signs:   Temp:  [97.6 F (36.4 C)-98.5 F (36.9 C)] 98.2 F (36.8 C) (10/02 0408) Pulse Rate:  [85-97] 88 (10/02 0700) Resp:  [16-37] 28 (10/02 0700) BP: (86-117)/(52-87) 117/54 (10/02 0500) SpO2:  [91 %-99 %] 98 % (10/02 0700) Weight:  [89.3 kg] 89.3 kg (10/02 0425) Last BM Date : 11/10/21  Weight  change: Filed Weights   11/09/21 0455 11/10/21 0355 11/11/21 0425  Weight: 90.2 kg 90.7 kg 89.3 kg    Intake/Output:   Intake/Output Summary (Last 24 hours) at 11/11/2021 0804 Last data filed at 11/11/2021 0700 Gross per 24 hour  Intake 1634.62 ml  Output 1250 ml  Net 384.62 ml    General:  No resp difficulty HEENT: +Cortrak  Neck: supple. no JVD. Carotids 2+ bilat; no bruits. No lymphadenopathy or thryomegaly appreciated. LIJ HD cath  Cor: PMI nondisplaced. Regular rate & rhythm. No rubs, gallops or murmurs. Lungs: coarse throughout. On room air.  Abdomen: soft, nontender, nondistended. No hepatosplenomegaly. No bruits or masses. Good bowel sounds. Flexisea  Extremities: no cyanosis, clubbing, rash, edema Neuro: alert & orientedx3, cranial nerves grossly intact. moves all 4 extremities w/o difficulty. Affect pleasant   Telemetry: SR 80-90s personally checked.  Labs: Basic Metabolic Panel: Recent Labs  Lab 11/07/21 0229 11/08/21 0420 11/09/21 0606 11/10/21 0829 11/11/21 0553 11/11/21 0558  NA 135 135 138 137 136  --   K 4.2 3.8 4.0 4.0 4.2  --   CL 93* 91* 96* 94* 94*  --   CO2 _0 --   GLUCOSE 168* 135* 148* 112* 124*  --   BUN 91* 48* 85* 51* 80*  --   CREATININE 5.20* 3.29* 4.83* 3.44* 4.75*  --   CALCIUM 9.2 9.2 9.3 9.4 9.3  --   MG 1.9 1.5* 1.8 1.6*  --  1.7  PHOS 3.7 2.8 3.2 3.1 2.8  --     Liver Function Tests: Recent Labs  Lab 11/07/21 0229 11/08/21 0420 11/09/21 0606 11/10/21 0829 11/11/21 0553  ALBUMIN 2.4* 2.7* 2.6* 2.8* 2.5*   No results for input(s): "LIPASE", "AMYLASE" in the last 168 hours. No results for input(s): "AMMONIA" in the last 168 hours.  CBC: Recent Labs  Lab 11/07/21 0229 11/08/21 0420 11/09/21 0606 11/10/21 0829 11/11/21 0558  WBC 18.8* 13.6* 13.1* 12.4* 10.9*  HGB 8.8* 8.5* 8.3* 9.6* 8.7*  HCT 28.1* 28.8* 27.4* 32.9* 29.7*  MCV 95.6 98.3 97.5 99.4 97.7  PLT 392 393 440* 442* 437*    Cardiac  Enzymes: No results for input(s): "CKTOTAL", "CKMB", "CKMBINDEX", "TROPONINI" in the last 168 hours.   BNP: BNP (last 3 results) Recent Labs    12/24/20 0651 09/28/21 0405  BNP 412.0* 826.6*    ProBNP (last 3 results) No results for input(s): "PROBNP" in the last 8760 hours.    Other results:  Imaging: No results found.   Medications:     Scheduled Medications:  sodium chloride   Intravenous Once   ascorbic acid  250 mg Per Tube BID   aspirin  81 mg Per Tube Daily   Chlorhexidine Gluconate Cloth  6 each Topical Daily   collagenase   Topical Daily   [START ON 11/12/2021] darbepoetin (ARANESP) injection - DIALYSIS  100 mcg Intravenous Q Tue-HD   feeding supplement (PROSource TF20)  60 mL Per Tube TID   feeding supplement (VITAL 1.5 CAL)  1,000 mL Per Tube Q24H   fentaNYL  1 patch Transdermal Q72H   fiber supplement (BANATROL TF)  60 mL Per Tube QID   folic acid  1 mg Per Tube Daily   Gerhardt's butt cream   Topical Daily   heparin injection (subcutaneous)  5,000 Units Subcutaneous Q8H   insulin aspart  0-20 Units Subcutaneous Q4H   insulin aspart  2 Units Subcutaneous Q4H   insulin glargine-yfgn  10 Units Subcutaneous QHS   midodrine  20 mg Per Tube TID WC   multivitamin  1 tablet Per Tube QHS   nutrition supplement (JUVEN)  1 packet Per Tube BID BM   mouth rinse  15 mL Mouth Rinse 4 times per day   pantoprazole  40 mg Per Tube BID   QUEtiapine  50 mg Per Tube QHS   sodium chloride flush  3 mL Intravenous Q12H   thiamine  100 mg Per Tube Daily    Infusions:  sodium chloride Stopped (11/09/21 0715)   sodium chloride Stopped (11/04/21 1021)   albumin human 12.5 g (11/01/21 2248)   anticoagulant sodium citrate     ferric gluconate (FERRLECIT) IVPB Stopped (11/10/21 1041)   norepinephrine (LEVOPHED) Adult infusion Stopped (11/06/21 0631)    PRN Medications: sodium chloride, acetaminophen (TYLENOL) oral liquid 160 mg/5 mL, albumin human, anticoagulant sodium  citrate, camphor-menthol, clonazePAM, dextrose, diphenhydrAMINE, heparin, heparin, heparin, HYDROmorphone (DILAUDID) injection, ipratropium-albuterol, ondansetron (ZOFRAN) IV, mouth rinse, [EXPIRED] oxyCODONE **FOLLOWED BY** oxyCODONE, polyethylene glycol, polyvinyl alcohol, sodium chloride flush, white petrolatum   Assessment/Plan:    1. Shock - mixed cardiogenic/hemorrhagic -> VA ECMO -> decannulated on 8/28 to Impella 5.5 - Echo 08/28: Underfilled LV, EF 55-60% with severe LVH and near normal RV systsystolic function.  - Impella extracted 9/7 -Bedside echo 09/13 - LV function preserved, RV mildly reduced. Small clot in posterior pericardium but no hemodynamic effect - Given prolonged abx course earlier in stay, addition of Daptomycin,  Micafungin and meropenem recommended per ID.  - Completed total of 14 days of Micafungin (ended 9/27) -  Off pressors.  - On midodrine 20 mg tid.   2. Cardiac arrest (PEA/bradycardic) - 8/19 in setting of tamponade - PEA arrest again on 08/28  3. Cardiac tamponade with emergent bedside sternotomy  - Diffuse epicardial bleeding with post-op Dresslers - return to OR 8/21 and 8/24 for washouts.  - Washout 8/28 in OR. Multiple units of blood products in OR.   - Chest closed on 9/2 - Hgb 8.1>7.6>8.2> 8>7.5>6.1>2uRBc transfusion>>8.8 >>8.5 >>8.7   4. Acute hypoxemic respiratory failure - Off ECMO.  -  Perc Trach placed 9/7  - s/p bronchoscopy w/ BAL 9/4.  - tracheal aspirate 09/13 growing few GPC and rare yeast  - Started back on IV abx as above d/t concern for sepsis.  - Trach downsized - Trach de cannulated 9/25  -Resolved.On room air.   5. AKI due to ATN - CRRT started 08/28.  - Remains on anuric - Transitioned to [facility] 9/21. Nephrology following  - s/p [facility] cath by IR    6. 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 Now off colchicine   7. 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 Continue aspirin 81 mg daily  8.  DM2 - continue SSI   9. PAF/AFL - Tolerates poorly.  - Recurrent AFL. S/p DCCV  to SR 08/29.  - Off amio d/t bradycardia - Not on [facility] for now d/t bleeding, DVT dose heparin.  - Maintaining SR.  10. ID - Completed vancomycin/meropenem with open chest.  - - Possible aspiration with vomiting.  - now off meropenum   - WBC down to 10.9   11. Neuro Oriented x3  12. FEN - TFs ongoing  - SLP following   13. Hyperkalemia - resolved - getting iHD   14. Unstageable Pressure Ulcer, Buttock  - WOC following.  - Needs hypdrotherapy.  - Continues to reposition  R/L  15. Abdominal Pain - Resolved.   Disposition- SNF. Not eligible for LTAC due to insurance.    Darrick Grinder, NP  8:04 AM   Patient seen and examined with the above-signed Advanced Practice Provider and/or Housestaff. I personally reviewed laboratory data, imaging studies and relevant notes. I independently examined the patient and formulated the important aspects of the plan. I have edited the note to reflect any of my changes or salient points. I have personally discussed the plan with the patient and/or family.  Denies CP or SOB. Says his butt hurts. BP soft but tolerating iHD on midodrine. Hgb stable. WBC coming down.  General:  Sitting up No resp difficulty HEENT: normal Neck: supple. no JVD. Trach site healing Carotids 2+ bilat; no bruits. No lymphadenopathy or thryomegaly appreciated. Cor: PMI nondisplaced. Regular rate & rhythm. No rubs, gallops or murmurs. Lungs: clear Abdomen: soft, nontender, nondistended. No hepatosplenomegaly. No bruits or masses. Good bowel sounds. Extremities: no cyanosis, clubbing, rash, edema Neuro: alert & orientedx3, cranial nerves grossly intact. moves all 4 extremities w/o difficulty. Affect pleasant  Continues to progress slowly. Remains in NSR. Volume status managed by HD. No room for GDMT with soft BP. Continue wound care.   Glori Bickers, MD  9:02 AM

## 2021-11-11 NOTE — Progress Notes (Signed)
Initial Nutrition Assessment  DOCUMENTATION CODES:   Not applicable  INTERVENTION:   Recommend considering PEG/G-tube placement at this time for continued nutrition support. Pt now with Cortrak/NG for >30 days, remains NPO, severely deconditioned with wounds. Even if diet advanced, pt will likely continue to benefit from supplemental TF.   Tube Feeding via Cortrak:   Vital 1.5 at 60 ml/hr Pro-source TF20 60 mL TID This provides 2400 kcals, 157 g of protein and 1094 mL of free water   Continue Juven BID, each packet provides 80 calories, 8 grams of carbohydrate, 2.5  grams of protein (collagen), 7 grams of L-arginine and 7 grams of L-glutamine; supplement contains CaHMB, Vitamins C, E, B12 and Zinc to promote wound healing   Continue Vitamin C, Zinc, Folic Acid, Renal MVI supplementation  NUTRITION DIAGNOSIS:   Inadequate oral intake related to acute illness as evidenced by NPO status.  Continues  GOAL:   Patient will meet greater than or equal to 90% of their needs  Progressing  MONITOR:   Vent status, Labs, Weight trends, TF tolerance  REASON FOR ASSESSMENT:   Ventilator    ASSESSMENT:   61 yo male admitted with acute on chronic CHF with recent CABG, symptomatic bradycardia and hypotension. PMH includes DM, CAD s/p recent CABG x 5, CKD 3, CVA  8/19 Pericardial window, evacuation of pericardial and pleural effusion 8/20 Intubated, Bedside mediastinal re-exploration, cardiac tamponade, mediastinal clot; PEA arrest due to tamponade, VA ECMO cannulation 8/21 Return to OR for bleeding, mediastinal re-exploration, Cortrak placed-gastric 8/23 TEE with EF 25-30% 8/25 Cortrak advanced to Post-Pyloric position 8/28 OR: VA ECMO decannulation, Impella placed, mediastinal washout, CRRT initiated 8/29 Brief arrest 9/02 OR: mediastinal washout, sternal closure 9/05 Vomiting large volume, TF held, NG placed to LIS, watery, foul smelling diarrhea, KUB negative for  ileus/obstruction, confirmed Cortrak is post pyloric 9/07 OR: Impella removed, Trach placed 9/08 CRRT held 9/09 CRRT restarted, MD changed to Nepro 9/10 TF off due to intolerance 9/11 Tolerating TF on Vital 1.5 formula 9/20 CRRT discontinued 9/25 Trach de-cannulated 9/22 FEES failed-severe aspiration risk NPO 9/29 Repeat FEES-severe aspiration risk-NPO  Pt receiving iHD TTS  Pt remains NPO, noted plan for  Pt has had an Cortrak/NG tube in place since 8/21  Vital 1.5 at 60 ml/hr; tolerating, NG has been removed, no vomiting recently Remains on Juven, vitamins/minerals, Pro-Source TF20  Pt with severe deconditioning. Pt working with PT, standing trials, EOB.   Banatrol restarted last week after discussion with PCCM, increased to QID on 10/01. Goal is to get rectal tube out, consistency remains type 7  Per Dr. Darcey Nora, sternal wound healing-95% granulation tissue.  Pt with unstageable wounds on bilateral buttocks and coccyx which is causing pain  Labs: CBGs 112-160, potassium wdl, phosphorus wdl, magnesium wdl Meds: folic acid, renal MVI, Vit C, thiamine    Diet Order:   Diet Order             Diet NPO time specified  Diet effective midnight                   EDUCATION NEEDS:   Not appropriate for education at this time  Skin:  Skin Assessment: Skin Integrity Issues: Skin Integrity Issues:: Unstageable Stage II: R upper buttock, L ischium Unstageable: L upper  buttock, gluteal cleft/distal sacrum Incisions: chest (closed, wound VAC removed) Other: sheer/friction to right scapula region  Last BM:  10/2 rectal tube, type 7 consistency persists  Height:   Ht Readings from  Last 1 Encounters:  10/23/21 5\' 7"  (1.702 m)    Weight:   Wt Readings from Last 1 Encounters:  11/11/21 89.3 kg     BMI:  Body mass index is 30.83 kg/m.  Estimated Nutritional Needs:   Kcal:  2200-2400 kcals  Protein:  135-165 g  Fluid:  1.8 L  Kerman Passey MS, RDN, LDN,  CNSC Registered Dietitian 3 Clinical Nutrition RD Pager and On-Call Pager Number Located in [place]

## 2021-11-11 NOTE — Progress Notes (Signed)
25 Days Post-Op Procedure(s) (LRB): REMOVAL OF IMPELLA LEFT VENTRICULAR ASSIST DEVICE (Right) TRANSESOPHAGEAL ECHOCARDIOGRAM (TEE) (N/A) PERCUTANEOUS TRACHEOSTOMY USING SHILEY FLEXIBLE 8 mm CUFFED TRACH. (N/A) Subjective: Sternal wound examined and saline 4x4 wet/dry dressings placed to lower sternum and chest tube sites Healing slowly- 95% clean granulation tissue. Some necrotic skin removed at edge of wounds WBC now 10k off antibiotics  Objective: Vital signs in last 24 hours: Temp:  [97.6 F (36.4 C)-98.5 F (36.9 C)] 98 F (36.7 C) (10/02 0800) Pulse Rate:  [85-97] 86 (10/02 0800) Cardiac Rhythm: Normal sinus rhythm (10/02 0800) Resp:  [16-37] 23 (10/02 0800) BP: (86-117)/(52-87) 117/54 (10/02 0500) SpO2:  [91 %-99 %] 94 % (10/02 0800) Weight:  [89.3 kg] 89.3 kg (10/02 0425)  Hemodynamic parameters for last 24 hours:    Intake/Output from previous day: 10/01 0701 - 10/02 0700 In: 1804.6 [I.V.:30; NG/GT:1670; IV Piggyback:104.6] Out: 1250 [Stool:1250] Intake/Output this shift: Total I/O In: 60 [NG/GT:60] Out: -   EXAM Nsr  neuro intact but with severe general weakness  Lab Results: Recent Labs    11/10/21 0829 11/11/21 0558  WBC 12.4* 10.9*  HGB 9.6* 8.7*  HCT 32.9* 29.7*  PLT 442* 437*   BMET:  Recent Labs    11/10/21 0829 11/11/21 0553  NA 137 136  K 4.0 4.2  CL 94* 94*  CO2 27 24  GLUCOSE 112* 124*  BUN 51* 80*  CREATININE 3.44* 4.75*  CALCIUM 9.4 9.3    PT/INR: No results for input(s): "LABPROT", "INR" in the last 72 hours. ABG    Component Value Date/Time   PHART 7.301 (L) 10/22/2021 2354   HCO3 19.3 (L) 10/22/2021 2354   TCO2 20 (L) 10/22/2021 2354   ACIDBASEDEF 7.0 (H) 10/22/2021 2354   O2SAT 71.7 11/02/2021 0439   CBG (last 3)  Recent Labs    11/10/21 2347 11/11/21 0406 11/11/21 0811  GLUCAP 150* 124* 160*    Assessment/Plan: S/P Procedure(s) (LRB): REMOVAL OF IMPELLA LEFT VENTRICULAR ASSIST DEVICE (Right) TRANSESOPHAGEAL  ECHOCARDIOGRAM (TEE) (N/A) PERCUTANEOUS TRACHEOSTOMY USING SHILEY FLEXIBLE 8 mm CUFFED TRACH. (N/A) Cont daily dressing changes Chest and sacral wounds personally inspected  LOS: 44 days    [name] 11/11/2021

## 2021-11-11 NOTE — TOC CM/SW Note (Addendum)
HF TOC CM spoke to pt at bedside. States his wife, Gardner Candle will be his DPOA, and she has completed paperwork. Gave permission to continue to discuss his case with wife. Contacted wife and states she will have POA registered at [place]. She will forward CM a copy to place on his chart. States she is working on completing his SS disability paperwork. States she spoke to [facility] disability and they are needing the [id number]. They have extended receipt of application from October 1 to October 9 to get all the information to process disability. Explained CM received call back from [facility], [place] Dandy and they cannot assist while application in process but states after 60 days if pt was denied that assist with appeal process.     [place], Heart Failure TOC CM [phone]

## 2021-11-11 NOTE — Progress Notes (Signed)
Patient ID: [name], male   DOB: 25-Aug-1960, 61 y.o.   MRN: [id number] [facility] Progress Note   Assessment/ Plan:   1. Acute kidney Injury: Anuric and secondary to ischemic ATN in the setting of cardiogenic shock/cardiac arrest.  Started on CRRT 10/07/2021 and has recently been transitioned to intermittent hemodialysis via left IJ TDC.  Continue hemodialysis at this time on a TTS schedule with next dialysis ordered for tomorrow; no acute indications noted for dialysis at this time. 2.  History of cardiogenic shock/cardiac arrest: With previous cardiac tamponade/hemorrhagic pericarditis status post pericardial drain placement and requirement of ECMO.  Underwent mediastinal washout with sternal closure on 9/2 and continues to make gradual improvement. 3.  Anemia of critical illness with acute blood loss anemia: Continue Aranesp with next dose due tomorrow.  Significantly low iron saturation noted on 11/04/2021 and getting intravenous iron. 4.  Coronary artery disease status post 5 vessel CABG on 09/12/2021  Subjective:   Reports that he slept only 1 hour last night and feels fatigued this morning.   Objective:   BP (!) 117/54   Pulse 86   Temp 98 F (36.7 C) (Axillary)   Resp (!) 23   Ht 5\' 7"  (1.702 m)   Wt 89.3 kg   SpO2 94%   BMI 30.83 kg/m   Intake/Output Summary (Last 24 hours) at 11/11/2021 0816 Last data filed at 11/11/2021 0800 Gross per 24 hour  Intake 1694.62 ml  Output 1250 ml  Net 444.62 ml   Weight change: -1.4 kg  Physical Exam: Gen: Appears comfortable resting on his left side, somnolent during conversation CVS: Pulse regular rhythm, normal rate, S1 and S2 sounds distant Resp: Clear to auscultation bilaterally, no distinct rales or rhonchi Abd: Firm, protuberant, bowel sounds normal Ext: Trace lower extremity edema  Imaging: No results found.  Labs: BMET Recent Labs  Lab 11/05/21 0409 11/06/21 0859 11/07/21 0229 11/08/21 0420  11/09/21 0606 11/10/21 0829 11/11/21 0553  NA 127* 134* 135 135 138 137 136  K 6.4* 4.4 4.2 3.8 4.0 4.0 4.2  CL 90* 93* 93* 91* 96* 94* 94*  CO2 20* 24 24 28 26 27 24   GLUCOSE 150* 172* 168* 135* 148* 112* 124*  BUN 141* 62* 91* 48* 85* 51* 80*  CREATININE 6.34* 4.10* 5.20* 3.29* 4.83* 3.44* 4.75*  CALCIUM 9.1 8.7* 9.2 9.2 9.3 9.4 9.3  PHOS 5.7* 4.0 3.7 2.8 3.2 3.1 2.8   CBC Recent Labs  Lab 11/08/21 0420 11/09/21 0606 11/10/21 0829 11/11/21 0558  WBC 13.6* 13.1* 12.4* 10.9*  HGB 8.5* 8.3* 9.6* 8.7*  HCT 28.8* 27.4* 32.9* 29.7*  MCV 98.3 97.5 99.4 97.7  PLT 393 440* 442* 437*    Medications:     sodium chloride   Intravenous Once   ascorbic acid  250 mg Per Tube BID   aspirin  81 mg Per Tube Daily   Chlorhexidine Gluconate Cloth  6 each Topical Daily   collagenase   Topical Daily   [START ON 11/12/2021] darbepoetin (ARANESP) injection - DIALYSIS  100 mcg Intravenous Q Tue-HD   feeding supplement (PROSource TF20)  60 mL Per Tube TID   feeding supplement (VITAL 1.5 CAL)  1,000 mL Per Tube Q24H   fentaNYL  1 patch Transdermal Q72H   fiber supplement (BANATROL TF)  60 mL Per Tube QID   folic acid  1 mg Per Tube Daily   Gerhardt's butt cream   Topical Daily   heparin injection (subcutaneous)  5,000 Units Subcutaneous Q8H   insulin aspart  0-20 Units Subcutaneous Q4H   insulin aspart  2 Units Subcutaneous Q4H   insulin glargine-yfgn  10 Units Subcutaneous QHS   midodrine  20 mg Per Tube TID WC   multivitamin  1 tablet Per Tube QHS   nutrition supplement (JUVEN)  1 packet Per Tube BID BM   mouth rinse  15 mL Mouth Rinse 4 times per day   pantoprazole  40 mg Per Tube BID   QUEtiapine  50 mg Per Tube QHS   sodium chloride flush  3 mL Intravenous Q12H   thiamine  100 mg Per Tube Daily   Elmarie Shiley, MD 11/11/2021, 8:16 AM

## 2021-11-11 NOTE — Progress Notes (Signed)
NAME:  [name], MRN:  [id number], DOB:  1960/07/19, LOS: 88 ADMISSION DATE:  09/28/2021, CONSULTATION DATE:  09/29/2021 REFERRING MD:  Kipp Brood - TCTS CHIEF COMPLAINT: Dyspnea   History of Present Illness:  61 year old man who underwent CABG on 8/3, readmitted on 8/19 in setting of dyspnea with a pericardial effusion requiring pericardial window via bedside sternotomy. Also with bilateral pleural effusions. Sustained PEA arrest on 8/20, briefly required VA ECMO which was discontinued on 8/28, Impella placed 8/28, started on CRRT.  Hypoxemia, repeat cardiac arrest on 8/30. Underwent mediastinal washout/sternotomy closure 9/2. Impella removed and tracheostomy completed 9/7.  Pertinent Medical History:  CAD s/p CABG 8/3 DM2 Diverticulosis GERD Hyperlipidemia Hypertension History of stroke Tobacco Abuse  Significant Hospital Events: Including procedures, antibiotic start and stop dates in addition to other pertinent events   8/19 Admitted, s/p pericardial window 8/20 PEA cardiac arrest, left pigtail chest tube placement, PEA cardiac arrest due to tamponade, bedside sternotomy with pericardial drains placed. 8/21 Return to OR for overnight bleeding.  8/22 Minimal chest tube output.  Atrial fibrillation, controled with amiodarone.  8/22 TEE showed EF 35% at baseline, which improved significantly with decreasing ECMO flow.  8/23 Tolerated diuresis  8/24 Mediastinal washout, removal of hematoma 8/25 Ongoing bleeding issues from posterior mediastinum 8/28 Decannulated and off ECMO, 5.5 Impella placed, L femoral HD cath placed and started on CRRT 8/30 Hypoxia and hypotension followed by brief arrest, gentle L lateral chest compressions performed and ROSC in roughly 1 minute. 8/31 Remains deeply sedated on ventilator with open chest.  No acute events overnight. 9/1 No acute events overnight, remains on ventilator deep sedation.  Tolerating aggressive volume removal per CRRT 9/2 Sternotomy  closure 9/3 Wean versed 9/4 FOB, low grade temps, notable thick mucus/frequent suctioning 9/5 Vomiting, ?mucus plugging. PSV 5-6 hours on 10/5 9/6 Impella down to p3 9/7 Impella removal and trach. PRBC and DDAVP in OR  9/8 Low dose epi, weaned on 8/8 9/9 RIJ HD catheter placed 9/11 Started on ATC at 28% and tolerating well 9/12 Remains on 28% ATC 9/13 SB/Junction rhythm with associated hypotension overnight requiring transient pressor escalation. Pressor requirement down in AM. WBC count 23 (18), Co-ox 36%. Resp/Blood Cx. 9/14 Mild bradycardia/relative hypotension with Precedex. Remains on Epi 11mcg, Vaso 0.03. Resp Cx with few gram+ cocci (clusters), yeast. BCx NGTD. 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 Co-ox 48%. CRRT stopped. 9/15 WBC continues to downtrend. Hgb 7.6. Net +1.49L/24H. CRRT resumed. Co-ox 51%. NE started to help with Epi wean. 9/16, Co. oximetry improved, WBC trending down, weaning pressors 9/19 on trach collar for 96 hours.  Weaning off vasopressors.  Trial off CRRT today.  Episode of overnight emesis. 9/20 more awake. CRRT off 9/21 cuffless tracheostomy. Do well with PMV 9/22 failed swallow evaluation 9/25 Tolerating trach capping on RA. Up to chair. Trach decannulation today. Tunneled dialysis catheter placement with IR. 9/26 Doing well post-trach decannulation. Able to phonate with light pressure. S/p TDC with IR, some catheter flow issues overnight. Reattempting HD today. 10/2 Getting iHD on midodrine, on RA, no complaints  Interim History / Subjective:  No overnight events, stable   Objective:  Blood pressure (!) 117/54, pulse 86, temperature 98 F (36.7 C), temperature source Axillary, resp. rate (!) 23, height 5\' 7"  (1.702 m), weight 89.3 kg, SpO2 94 %.        Intake/Output Summary (Last 24 hours) at 11/11/2021 0910 Last data filed at 11/11/2021 0800 Gross per 24 hour  Intake 1694.62 ml  Output 1280 ml  Net 414.62 ml     Filed Weights   11/09/21 0455 11/10/21 0355 11/11/21 0425  Weight: 90.2 kg 90.7 kg 89.3 kg     General:  chronically ill-appearing M, resting in bed in no distress HEENT: MM pink/moist, sclera anicteric Neuro: resting, fatigued but arousable and following commands CV: s1s2 rrr, no m/r/g PULM:  no distress on RA, no rhonchi or wheezing GI: soft, non-tender Extremities: warm/dry, no edema  Skin: no rashes or lesions      Assessment & Plan:   Acute metabolic encephalopathy.   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 EF now normal. Hx of afib- current sinus, no recurrence recently. Tolerates very poorly. Acute respiratory failure with hypoxemia and hypercarbia- resolved Sacral pressure wound not POA- wound care, con't to unload this area.  AKI, now likely HD dependent. Tunneled catheter in place. 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 Has oxycodone and clonazepam down tube. Con't seroquel. -Con't TF and NGT, con't rehab efforts with SLP. -sacral and sternal wounds-- con't wound care -Frequent reorientation  -PCCM will sign off, please re-consult PCCM for any pulmonary or critical care needs  Best Practice (right click and "Reselect all SmartList Selections" daily)  Diet/type: tubefeeds DVT prophylaxis: prophylactic heparin  GI prophylaxis: PPI Lines: N/A Foley:  N/A Code Status:  full code Last date of multidisciplinary goals of care discussion patient updated daily.    Otilio Carpen Joletta Manner, PA-C [place]  [facility] See Amion for pager If no response to pager , please call [phone] until 7pm After 7:00 pm call Elink  [phone]

## 2021-11-11 NOTE — TOC Progression Note (Signed)
Transition of Care [facility]) - Progression Note    Patient Details  Name: [name] MRN: [id number] Date of Birth: 1960-05-30  Transition of Care [facility]) CM/SW [place], [place] Phone Number: 11/11/2021, 4:42 PM  Clinical Narrative:     Patient referred to [facility].CSW following for determination. CSW will continue to follow and assist with patients dc planning needs.  Expected Discharge Plan: IP Rehab Facility Barriers to Discharge: Continued Medical Work up  Expected Discharge Plan and Services Expected Discharge Plan: [place]                                               Social Determinants of Health (SDOH) Interventions    Readmission Risk Interventions    09/20/2021   11:57 AM  Readmission Risk Prevention Plan  Post Dischage Appt Complete  Medication Screening Complete  Transportation Screening Complete

## 2021-11-11 NOTE — Progress Notes (Signed)
CT surgery PM rounds  Stable day, no events Remains on tube feeds at goal  Blood pressure (!) 154/81, pulse 96, temperature 97.7 F (36.5 C), temperature source Axillary, resp. rate (!) 35, height 5\' 7"  (1.702 m), weight 89.3 kg, SpO2 95 %.

## 2021-11-12 ENCOUNTER — Inpatient Hospital Stay (HOSPITAL_COMMUNITY): Payer: Self-pay

## 2021-11-12 LAB — GLUCOSE, CAPILLARY
Glucose-Capillary: 133 mg/dL — ABNORMAL HIGH (ref 70–99)
Glucose-Capillary: 151 mg/dL — ABNORMAL HIGH (ref 70–99)
Glucose-Capillary: 123 mg/dL — ABNORMAL HIGH (ref 70–99)
Glucose-Capillary: 148 mg/dL — ABNORMAL HIGH (ref 70–99)
Glucose-Capillary: 152 mg/dL — ABNORMAL HIGH (ref 70–99)
Glucose-Capillary: 99 mg/dL (ref 70–99)
Glucose-Capillary: 100 mg/dL — ABNORMAL HIGH (ref 70–99)

## 2021-11-12 LAB — RENAL FUNCTION PANEL
Albumin: 2.5 g/dL — ABNORMAL LOW (ref 3.5–5.0)
Anion gap: 17 — ABNORMAL HIGH (ref 5–15)
BUN: 103 mg/dL — ABNORMAL HIGH (ref 8–23)
CO2: 24 mmol/L (ref 22–32)
Calcium: 9.5 mg/dL (ref 8.9–10.3)
Chloride: 94 mmol/L — ABNORMAL LOW (ref 98–111)
Creatinine, Ser: 5.77 mg/dL — ABNORMAL HIGH (ref 0.61–1.24)
GFR, Estimated: 10 mL/min — ABNORMAL LOW (ref >60)
Glucose, Bld: 134 mg/dL — ABNORMAL HIGH (ref 70–99)
Phosphorus: 2.8 mg/dL (ref 2.5–4.6)
Potassium: 4.8 mmol/L (ref 3.5–5.1)
Sodium: 135 mmol/L (ref 135–145)

## 2021-11-12 LAB — CBC
HCT: 28.3 % — ABNORMAL LOW (ref 39.0–52.0)
Hemoglobin: 8.3 g/dL — ABNORMAL LOW (ref 13.0–17.0)
MCH: 29 pg (ref 26.0–34.0)
MCHC: 29.3 g/dL — ABNORMAL LOW (ref 30.0–36.0)
MCV: 99 fL (ref 80.0–100.0)
Platelets: 432 10*3/uL — ABNORMAL HIGH (ref 150–400)
RBC: 2.86 MIL/uL — ABNORMAL LOW (ref 4.22–5.81)
RDW: 18.8 % — ABNORMAL HIGH (ref 11.5–15.5)
WBC: 9.1 10*3/uL (ref 4.0–10.5)
nRBC: 0.2 % (ref 0.0–0.2)

## 2021-11-12 LAB — MAGNESIUM: Magnesium: 2 mg/dL (ref 1.7–2.4)

## 2021-11-12 MED ORDER — SODIUM BICARBONATE 650 MG PO TABS
650.0000 mg | ORAL_TABLET | Freq: Once | ORAL | Status: AC
  Administered 2021-11-12: 650 mg
  Filled 2021-11-12: qty 1

## 2021-11-12 MED ORDER — PANCRELIPASE (LIP-PROT-AMYL) 10440-39150 UNITS PO TABS
20880.0000 [IU] | ORAL_TABLET | Freq: Once | ORAL | Status: AC
  Administered 2021-11-12: 20880 [IU]
  Filled 2021-11-12: qty 2

## 2021-11-12 MED ORDER — DAKINS (1/4 STRENGTH) 0.125 % EX SOLN
Freq: Two times a day (BID) | CUTANEOUS | Status: AC
  Filled 2021-11-12 (×2): qty 473

## 2021-11-12 NOTE — Progress Notes (Signed)
PT Cancellation Note  Patient Details Name: [name] MRN: [id number] DOB: 08-05-1960   Cancelled Treatment:    Reason Eval/Treat Not Completed: Patient at procedure or test/unavailable (HD)   Xue Low B Jonnelle Lawniczak 11/12/2021, 8:02 AM [name], PT Acute Rehabilitation Services Office: [phone]

## 2021-11-12 NOTE — Progress Notes (Signed)
Pt's case discussed with nephrologist who feels pt is now ESRD. Inquired of TOC staff what pt's current d/c plan will be. Plan is for pt to return home once more mobile. Will attempt to meet with pt tomorrow to CLIP pt for out-pt HD at d/c. Due to pt being deemed ESRD, navigator can proceed with CLIP even though pt is uninsured. Will assist as needed.   Melven Sartorius Renal Navigator [phone]

## 2021-11-12 NOTE — Progress Notes (Signed)
Occupational Therapy Treatment Patient Details Name: [name] MRN: [id number] DOB: [birth year] Today's Date: 11/12/2021   History of present illness Pt is a 61 y.o. male admitted 09/28/21 with SOB, bradycardia, hypotension. Workup for bilateral pleural effusion, pericardial effusion s/p VATS with pericardial window 8/19. Cardiac arrest with tamponade 8/20, intubated with bedside mediastinal exploration, ECMO via fem access. Pt with mediastinal hemorrhage s/p reexploration 8/21. To OR 8/28 for washout, ECMO decannulation, Impella placement. CRRT initiated 8/28. Brief PEA arrest 8/29 with DCCV to NSR.  S/p chest closure 9/2. S/p trach and impella removed 9/7. S/p wound vac removal 9/11. Course complicated by bouts of hypotension and bradycardia. Off CRRT 9/19. Trach decannulated 9/25. Tunned HD cath placement 9/25; iHD initiated. PMH includes CAD (s/p recent CABG 09/12/21), CKD2, DM, HTN, AKI, afib, CVA.   OT comments  Pt having currently having dialysis therefore Focus of session on R hand strengthening. Digit extension appears slightly improved however pt does not have active digit flexion. Will most likely fabricate a resting hand splint to use at night to better position hand. Will begin training in use of U-cuff for R hand. Will follow.    Recommendations for follow up therapy are one component of a multi-disciplinary discharge planning process, led by the attending physician.  Recommendations may be updated based on patient status, additional functional criteria and insurance authorization.    Follow Up Recommendations  Skilled nursing-short term rehab (<3 hours/day)    Assistance Recommended at Discharge Frequent or constant Supervision/Assistance  Patient can return home with the following  Two people to help with walking and/or transfers;Two people to help with bathing/dressing/bathroom;Direct supervision/assist for medications management;Direct supervision/assist for financial  management;Assistance with feeding;Assistance with cooking/housework;Assist for transportation;Help with stairs or ramp for entrance   Equipment Recommendations  Other (comment)    Recommendations for Other Services      Precautions / Restrictions Precautions Precautions: Sternal;Fall;Other (comment) Precaution Comments: cortrak, flexiseal, painful sacral/buttocks wound; bedside dialysis Restrictions Other Position/Activity Restrictions: initial CABG 09/12/21       Mobility Bed Mobility                    Transfers                   General transfer comment: deferred this date due to having HD     Balance                                           ADL either performed or assessed with clinical judgement   ADL                                         General ADL Comments: not addressed this session; having HD    Extremity/Trunk Assessment Upper Extremity Assessment Upper Extremity Assessment: RUE deficits/detail RUE Deficits / Details: do not observe any increase in digit flexion AROM this date; digit extension appears minimally stronger; no active opposition of thumb; MP joints appears slightly tighter tthan last visit RUE Coordination: decreased fine motor;decreased gross motor (non-functional grip/pinch)   Lower Extremity Assessment Lower Extremity Assessment: Defer to PT evaluation        Vision       Perception     Praxis      Cognition  Arousal/Alertness: Lethargic Behavior During Therapy: Anxious Overall Cognitive Status: Impaired/Different from baseline                                 General Comments: Pt lethargic; eyes closed majority of session; mumbling at times, answering questions correctly at times; assuming lethargy most likely related to pain meds        Exercises Hand Exercises Wrist Flexion: AROM, AAROM, Right, 15 reps Wrist Extension: AROM, Right, 15 reps Wrist Ulnar  Deviation: AAROM, Right, 15 reps Wrist Radial Deviation: AAROM, Right, 15 reps Digit Composite Flexion: PROM, AAROM, Right, 20 reps Composite Extension: AROM, AAROM, 20 reps, Right Digit Composite Abduction: PROM, Right, 15 reps Digit Composite Adduction: PROM, Right, 15 reps Digit Lifts: Right, AROM, Strengthening, 15 reps Thumb Abduction: Right, PROM, 15 reps Thumb Adduction: PROM, Right, 15 reps Opposition: PROM, Right, 10 reps    Shoulder Instructions       General Comments      Pertinent Vitals/ Pain       Pain Assessment Pain Assessment: Faces Faces Pain Scale: Hurts even more Pain Location: generalized Pain Descriptors / Indicators: Grimacing Pain Intervention(s): Limited activity within patient's tolerance, Premedicated before session  Home Living                                          Prior Functioning/Environment              Frequency  Min 2X/week        Progress Toward Goals  OT Goals(current goals can now be found in the care plan section)  Progress towards OT goals: Not progressing toward goals - comment (on HD at this time; lethargic)  Acute Rehab OT Goals Patient Stated Goal: to be able to use his hand OT Goal Formulation: With patient Time For Goal Achievement: 11/18/21 Potential to Achieve Goals: Good ADL Goals Pt Will Perform Grooming: with mod assist;bed level Pt Will Perform Upper Body Bathing: with mod assist;bed level Pt/caregiver will Perform Home Exercise Program: Increased strength;Increased ROM;Both right and left upper extremity;With minimal assist Additional ADL Goal #1: Pt will demosntrate sustained attention for grooming task in nondistracting environment Additional ADL Goal #2: Pt will complete bed mobility with mod A in preparation for ADL tasks Additional ADL Goal #3: Pt will grasp/release objects using functional pinch pattern with necessary AE/ splinting to increase functional use R hand  Plan Discharge  plan remains appropriate;Frequency remains appropriate    Co-evaluation                 AM-PAC OT "6 Clicks" Daily Activity     Outcome Measure   Help from another person eating meals?: Total Help from another person taking care of personal grooming?: A Little Help from another person toileting, which includes using toliet, bedpan, or urinal?: Total Help from another person bathing (including washing, rinsing, drying)?: A Lot Help from another person to put on and taking off regular upper body clothing?: Total Help from another person to put on and taking off regular lower body clothing?: Total 6 Click Score: 9    End of Session    OT Visit Diagnosis: Unsteadiness on feet (R26.81);Other abnormalities of gait and mobility (R26.89);Muscle weakness (generalized) (M62.81)   Activity Tolerance Patient limited by fatigue   Patient Left in bed;with call bell/phone within  reach;with nursing/sitter in room;Other (comment) (HD ongoing)   Nurse Communication Other (comment) (will return another time to work on mobility)        Time: 1025-1040 OT Time Calculation (min): 15 min  Charges: OT General Charges $OT Visit: 1 Visit OT Treatments $Therapeutic Exercise: 8-22 mins  Maurie Boettcher, OT/L   Acute OT Clinical Specialist [place] Pager [phone] Office [phone]   [facility] 11/12/2021, 11:03 AM

## 2021-11-12 NOTE — Progress Notes (Signed)
Received patient in bed to unit.  Alert and oriented.  Informed consent signed and in chart.   Treatment initiated: 0812 Treatment completed: 0825  Patient tolerated well.  Transported back to the room  Alert, without acute distress.  Hand-off given to patient's nurse.   Access used: [facility]  Access issues: Lines reversed for flow   Total UF removed: 2.5  Medication(s) given: Aranesp 118mcg Post HD VS: 153/83 Post HD weight: 94.8   [facility]

## 2021-11-12 NOTE — Progress Notes (Signed)
Speech Language Pathology Treatment: Dysphagia  Patient Details Name: [name] MRN: [id number] DOB: [birth year] Today's Date: 11/12/2021 Time: 6153-7943 SLP Time Calculation (min) (ACUTE ONLY): 22 min  Assessment / Plan / Recommendation Clinical Impression  Pt is pretty uncomfortable this morning secondary to sacral wound, reporting that it is "not one of his best days." Despite this, he is still very agreeable to participate in dysphagia therapy. He consumed ice chips without overt signs of aspiration. He needed at least Mod cues to complete pharyngeal exercises that included effortful swallows, CTAR, and Masako maneuver. This is the most he has been able to do in one therapy session even though he is not feeling his best. Discussed with him working toward completion of MBS to reassess swallow - he would have to sit on his bottom in order to complete this test, and he voices concern over this, but is agreeable to working toward this perhaps by the end of the week if TCTS is in agreement with him leaving the floor to go to radiology for testing. For now, would continue to offer him a few pieces of ice at a time with staff, after oral care is performed.    HPI [name], bradycardia and hypotension. Pt with bil pleural effusion and pericardial effusion s/p VATS with pericardial window 8/19. 8/20 cardiac tamponade with arrest,  intubated with bedside mediastinal exploration and ECMO via fem access. OR 8/21 for reexploration due to mediastinal hemorrhage. 8/28 OR for washout with ECMO decannulation and Impella placed. 8/29 brief PEA arrest with DCCV to NSR. 9/2 chest closure. 9/7 trach and Impella removed.9/11 VAC removed. Decannulated 9/26. PMHx: CAD s/p recent CABG x5 (admitted  8/3-11/23 with post op Afib), CKD stage II, DM, HTN, AKI, Afib and CVA      SLP Plan  Continue with current plan of care      Recommendations for follow up therapy are one component of a  multi-disciplinary discharge planning process, led by the attending physician.  Recommendations may be updated based on patient status, additional functional criteria and insurance authorization.    Recommendations  Diet recommendations: NPO;Other(comment) (few ice chips at a time after oral care) Medication Administration: Via alternative means Supervision: Full supervision/cueing for compensatory strategies                Oral Care Recommendations: Oral care QID Follow Up Recommendations: Skilled nursing-short term rehab (<3 hours/day) Assistance recommended at discharge: Frequent or constant Supervision/Assistance SLP Visit Diagnosis: Dysphagia, pharyngeal phase (R13.13) Plan: Continue with current plan of care           Osie Bond., M.A. [place] Office [phone]  Secure chat preferred   11/12/2021, 9:41 AM

## 2021-11-12 NOTE — Progress Notes (Signed)
Patient ID: [name], male   DOB: Apr 01, 1960, 61 y.o.   MRN: [id number] [facility] Progress Note   Assessment/ Plan:   1. Acute kidney Injury: Anuric and secondary to ischemic ATN in the setting of cardiogenic shock/cardiac arrest.  Started on CRRT 10/07/2021 and has recently been transitioned to intermittent hemodialysis via left IJ TDC.  Without any urine output/evidence of renal recovery and high index of suspicion that he may require long-term dialysis dependency for ESRD 2.  History of cardiogenic shock/cardiac arrest: With previous cardiac tamponade/hemorrhagic pericarditis status post pericardial drain placement and requirement of ECMO.  Underwent mediastinal washout with sternal closure on 9/2 and continues to make gradual improvement. 3.  Anemia of critical illness with acute blood loss anemia: Continue Aranesp with next dose due tomorrow.  Significantly low iron saturation noted on 11/04/2021 and getting intravenous iron. 4.  Coronary artery disease status post 5 vessel CABG on 09/12/2021  Subjective:   No acute event overnight, still having difficulty with positioning/discomfort while resting.   Objective:   BP 109/76   Pulse 91   Temp 97.7 F (36.5 C) (Oral)   Resp (!) 27   Ht 5\' 7"  (1.702 m)   Wt 97.3 kg   SpO2 96%   BMI 33.60 kg/m   Intake/Output Summary (Last 24 hours) at 11/12/2021 0828 Last data filed at 11/12/2021 7681 Gross per 24 hour  Intake 2018 ml  Output 400 ml  Net 1618 ml   Weight change: 8 kg  Physical Exam: Gen: Comfortable resting on his side.  Currently getting dialysis via [facility] CVS: Pulse regular rhythm, normal rate, S1 and S2 sounds distant Resp: Clear to auscultation bilaterally, no distinct rales or rhonchi Abd: Firm, protuberant, bowel sounds normal Ext: Trace lower extremity edema  Imaging: DG Chest Port 1 View  Result Date: 11/12/2021 CLINICAL DATA:  Shortness of breath.  Recent CABG. EXAM: PORTABLE CHEST 1 VIEW COMPARISON:   Chest radiograph November 06, 2021, CT chest 09/28/2021 FINDINGS: Postsurgical changes from median sternotomy and CABG. There is an enteric tube that courses below diaphragm with the tip out of the field of view. Left-sided tunneled central venous catheter in place with the tip in the mid SVC. Surgical clips are also seen in the right axilla. Cardiac and mediastinal contours are unchanged compared to recent chest radiograph. Redemonstrated are bibasilar pulmonary opacities and likely a small to moderate-sized left-sided pleural effusion. The hazy pulmonary opacity at the right lung base appears slightly more conspicuous on this exam. There are no displaced rib fractures. There are degenerative changes at the right-greater-than-left [facility] joint. Visualized upper abdomen is unremarkable. IMPRESSION: 1. Persistent bibasilar pulmonary opacities and likely small to moderate-sized left-sided pleural effusion. The hazy pulmonary opacity at the right lung base appears slightly more conspicuous on this exam, which could represent progressive atelectasis and a component of pulmonary edema. 2. Support apparatus as above. Electronically Signed   By: Marin Roberts M.D.   On: 11/12/2021 08:17    Labs: BMET Recent Labs  Lab 11/06/21 0859 11/07/21 0229 11/08/21 0420 11/09/21 0606 11/10/21 0829 11/11/21 0553 11/12/21 0315  NA 134* 135 135 138 137 136 135  K 4.4 4.2 3.8 4.0 4.0 4.2 4.8  CL 93* 93* 91* 96* 94* 94* 94*  CO2 24 24 28 26 27 24 24   GLUCOSE 172* 168* 135* 148* 112* 124* 134*  BUN 62* 91* 48* 85* 51* 80* 103*  CREATININE 4.10* 5.20* 3.29* 4.83* 3.44* 4.75* 5.77*  CALCIUM  8.7* 9.2 9.2 9.3 9.4 9.3 9.5  PHOS 4.0 3.7 2.8 3.2 3.1 2.8 2.8   CBC Recent Labs  Lab 11/09/21 0606 11/10/21 0829 11/11/21 0558 11/12/21 0315  WBC 13.1* 12.4* 10.9* 9.1  HGB 8.3* 9.6* 8.7* 8.3*  HCT 27.4* 32.9* 29.7* 28.3*  MCV 97.5 99.4 97.7 99.0  PLT 440* 442* 437* 432*    Medications:     sodium chloride   Intravenous  Once   ascorbic acid  250 mg Per Tube BID   aspirin  81 mg Per Tube Daily   Chlorhexidine Gluconate Cloth  6 each Topical Daily   darbepoetin (ARANESP) injection - DIALYSIS  100 mcg Intravenous Q Tue-HD   feeding supplement (PROSource TF20)  60 mL Per Tube TID   feeding supplement (VITAL 1.5 CAL)  1,000 mL Per Tube Q24H   fentaNYL  1 patch Transdermal Q72H   fiber supplement (BANATROL TF)  60 mL Per Tube QID   folic acid  1 mg Per Tube Daily   Gerhardt's butt cream   Topical Daily   heparin injection (subcutaneous)  5,000 Units Subcutaneous Q8H   insulin aspart  0-20 Units Subcutaneous Q4H   insulin aspart  2 Units Subcutaneous Q4H   insulin glargine-yfgn  10 Units Subcutaneous QHS   midodrine  20 mg Per Tube TID WC   multivitamin  1 tablet Per Tube QHS   nutrition supplement (JUVEN)  1 packet Per Tube BID BM   mouth rinse  15 mL Mouth Rinse 4 times per day   pantoprazole  40 mg Per Tube BID   QUEtiapine  50 mg Per Tube QHS   sodium chloride flush  3 mL Intravenous Q12H   sodium hypochlorite   Topical BID   thiamine  100 mg Per Tube Daily   Elmarie Shiley, MD 11/12/2021, 8:28 AM

## 2021-11-12 NOTE — Progress Notes (Signed)
Physical Therapy Treatment Patient Details Name: [name] MRN: [id number] DOB: 1960/03/22 Today's Date: 11/12/2021   History of Present Illness Pt is a 61 y.o. male admitted 09/28/21 with SOB, bradycardia, hypotension. Workup for bilateral pleural effusion, pericardial effusion s/p VATS with pericardial window 8/19. Cardiac arrest with tamponade 8/20, intubated with bedside mediastinal exploration, ECMO via fem access. Pt with mediastinal hemorrhage s/p reexploration 8/21. To OR 8/28 for washout, ECMO decannulation, Impella placement. CRRT initiated 8/28. Brief PEA arrest 8/29 with DCCV to NSR.  S/p chest closure 9/2. S/p trach and impella removed 9/7. S/p wound vac removal 9/11. Course complicated by bouts of hypotension and bradycardia. Off CRRT 9/19. Trach decannulated 9/25. Tunned HD cath placement 9/25; iHD initiated. PMH includes CAD (s/p recent CABG 09/12/21), CKD2, DM, HTN, AKI, afib, CVA.    PT Comments    Pt sidelying finishing HD on arrival with pt stating discomfort and wanting to get OOB. Pt in sitting EOB reporting increased comfort with upright however decreased ability with rise to standing in stedy this session and could not get comfortable in chair with ultimate return to bed. Superior left portion of sacral wound noted to have induration with RN made aware. Will continue to work toward increased mobility.   HR 98-108 BP 109/96 sitting EOB 110/76 sitting in chair   Recommendations for follow up therapy are one component of a multi-disciplinary discharge planning process, led by the attending physician.  Recommendations may be updated based on patient status, additional functional criteria and insurance authorization.  Follow Up Recommendations  Skilled nursing-short term rehab (<3 hours/day) Can patient physically be transported by private vehicle: No   Assistance Recommended at Discharge Frequent or constant Supervision/Assistance  Patient can return home with the following  Two people to help with walking and/or transfers;Two people to help with bathing/dressing/bathroom;Direct supervision/assist for medications management;Assistance with feeding;Assistance with cooking/housework;Direct supervision/assist for financial management;Assist for transportation   Equipment Recommendations  Wheelchair (measurements PT);Wheelchair cushion (measurements PT);Hospital bed    Recommendations for Other Services       Precautions / Restrictions Precautions Precautions: Sternal;Fall;Other (comment) Precaution Comments: cortrak, flexiseal, painful sacral/buttocks wound Restrictions Other Position/Activity Restrictions: initial CABG 09/12/21 with closure 9/2     Mobility  Bed Mobility Overal bed mobility: Needs Assistance Bed Mobility: Sidelying to Sit, Sit to Sidelying   Sidelying to sit: Max assist, +2 for physical assistance     Sit to sidelying: Mod assist General bed mobility comments: pt right sidelying on arrival with max assist to bring legs off bed and max +2 to elevate trunk from surface. Return to bed with mod assist to lift legs to surface with pt able to lower trunk    Transfers Overall transfer level: Needs assistance   Transfers: Sit to/from Stand, Bed to chair/wheelchair/BSC Sit to Stand: Mod assist, +2 physical assistance           General transfer comment: mod +2 assist to stand from bed, stedy and chair. Pt could not get comfortable in chair leaning right and sliding forward with ultimate return to bed  via stedy. Pt only maintaining static standing a few seconds and could not achieve fully upright this session. Transfer via Lift Equipment: Stedy  Ambulation/Gait               General Gait Details: unable   Stairs             Wheelchair Mobility    Modified Rankin (Stroke Patients Only)  Balance Overall balance assessment: Needs assistance Sitting-balance support: Feet supported, No upper extremity  supported Sitting balance-Leahy Scale: Fair Sitting balance - Comments: minguard EOB, in chair heavy lean Right   Standing balance support: During functional activity, Bilateral upper extremity supported Standing balance-Leahy Scale: Poor Standing balance comment: bil hands on stedy but only grasping bar with LUE, cues for hands on thighs with standing transfer                            Cognition Arousal/Alertness: Awake/alert Behavior During Therapy: Anxious Overall Cognitive Status: Impaired/Different from baseline Area of Impairment: Attention, Safety/judgement, Awareness, Problem solving, Orientation, Memory, Following commands                 Orientation Level: Disoriented to, Time Current Attention Level: Sustained Memory: Decreased recall of precautions Following Commands: Follows one step commands with increased time, Follows one step commands inconsistently Safety/Judgement: Decreased awareness of deficits, Decreased awareness of safety Awareness: Intellectual Problem Solving: Slow processing, Decreased initiation, Requires verbal cues, Requires tactile cues General Comments: pt with eyes open but reports no sleep, fatigue and moaning throughout. On arrival begging to get up and once up uncomfortable as well        Exercises General Exercises - Lower Extremity Short Arc Quad: AAROM, Both, 10 reps, Seated    General Comments        Pertinent Vitals/Pain Pain Assessment Pain Score: 8  Pain Location: sacrum Pain Descriptors / Indicators: Aching, Guarding Pain Intervention(s): Limited activity within patient's tolerance, Monitored during session, Premedicated before session, Repositioned    Home Living                          Prior Function            PT Goals (current goals can now be found in the care plan section) Progress towards PT goals: Not progressing toward goals - comment (limited by fatigue and pain)    Frequency     Min 3X/week      PT Plan Current plan remains appropriate    Co-evaluation              AM-PAC PT "6 Clicks" Mobility   Outcome Measure  Help needed turning from your back to your side while in a flat bed without using bedrails?: A Lot Help needed moving from lying on your back to sitting on the side of a flat bed without using bedrails?: Total Help needed moving to and from a bed to a chair (including a wheelchair)?: Total Help needed standing up from a chair using your arms (e.g., wheelchair or bedside chair)?: Total Help needed to walk in hospital room?: Total Help needed climbing 3-5 steps with a railing? : Total 6 Click Score: 7    End of Session Equipment Utilized During Treatment: Gait belt Activity Tolerance: Patient limited by pain Patient left: in bed;with call bell/phone within reach;with bed alarm set;with nursing/sitter in room Nurse Communication: Mobility status;Need for lift equipment PT Visit Diagnosis: Other abnormalities of gait and mobility (R26.89);Difficulty in walking, not elsewhere classified (R26.2);Muscle weakness (generalized) (M62.81)     Time: 1761-6073 PT Time Calculation (min) (ACUTE ONLY): 64 min  Charges:  $Therapeutic Activity: 53-67 mins                     Saphia Vanderford P, PT Acute Rehabilitation Services Office: [place]  Cristine Daw 11/12/2021, 1:48 PM

## 2021-11-12 NOTE — Progress Notes (Signed)
Patient ID: [name], male   DOB: 04-Aug-1960, 61 y.o.   MRN: [id number]   Advanced Heart Failure Rounding Note   Subjective:    - 8/19 Pericardial window - 8/20 Cardiac arrest with tamponade -> Emergent bedside washout - 09/29/21 VA Cannulation - 09/30/21 Return to OR for mediastinal hemorrhage - 10/01/21 Developed AF -> amio - 10/02/21 TEE EF 25-30%  - 10/04/21 OR for washout. C/b continued bleeding overnight - 10/07/21 Placement of Impella 5.5 with washout, VA ECMO decannulation. Hypotensive with development of severe RV dysfunction after ECMO off and pressors titrated up. Multiple units of blood products. - 10/08/21 Brief PEA arrest. AFL with RVR >> S/p DCCV to SR, back in AFL shortly after - 8/31 Give 1UPRBCs  - 9/2 OR for chest closure - 9/3 Hypotensive overnight w/ SBPs in 80s. Febrile, mTemp 100.8. CRRT paused. VP increased to 0.04.  - 9/4 s/p bronchoscopy w/ BAL by PCCM, Cx NGTD  - 9/5 vomiting w/ large volume NGT output + watery/foul diarrhea, Tube feeds held. No signs of ileus on KUB. C-diff negative   - 9/7 OR for Impella Extraction and percutaneous tracheostomy. 2 u RBCs. - 9/8 CVVH stopped and line removed for holiday - 9/9 CVVH restarted.  -9/13 Worsening leukocytosis and pressor requirements. Purulent drainage from arterial line. Started Daptomycin, Micafungin and Meropenem. - 9/17 Daptomycin + Micafungin stopped. Remains on Meropenum  -9/19 CRRT holiday.  -9/20 Trach downsized  -9/21 transitioned to iHD  - 9/25 Tunneled HD cath placed. Trach decannulation - 9/26 Hgb 6.1>>transfused 2uRBC   Tolerating iHD. Says he is having a rough day. Butt sore. Working with Speech No CP or SOB,   Objective:     Vital Signs:   Temp:  [97.7 F (36.5 C)-98.4 F (36.9 C)] 98 F (36.7 C) (10/03 0833) Pulse Rate:  [85-105] 96 (10/03 0930) Resp:  [11-41] 28 (10/03 0930) BP: (109-165)/(53-84) 151/83 (10/03 0915) SpO2:  [91 %-98 %] 98 % (10/03 0930) Weight:  [97.3 kg] 97.3 kg (10/03  0325) Last BM Date : 11/11/21  Weight change: Filed Weights   11/10/21 0355 11/11/21 0425 11/12/21 0325  Weight: 90.7 kg 89.3 kg 97.3 kg    Intake/Output:   Intake/Output Summary (Last 24 hours) at 11/12/2021 0933 Last data filed at 11/12/2021 0740 Gross per 24 hour  Intake 2018 ml  Output 430 ml  Net 1588 ml     General:  Weak appearing. No resp difficulty HEENT: normal Neck: supple. + Cor-trak Carotids 2+ bilat; no bruits. No lymphadenopathy or thryomegaly appreciated. Cor: PMI nondisplaced. Regular rate & rhythm. No rubs, gallops or murmurs. + HD cath Lungs: clear Abdomen: soft, nontender, nondistended. No hepatosplenomegaly. No bruits or masses. Good bowel sounds. Extremities: no cyanosis, clubbing, rash, edema Neuro: alert & orientedx3, cranial nerves grossly intact. moves all 4 extremities w/o difficulty. Affect pleasant   Telemetry: SR 80-90s  personally checked.   Labs: Basic Metabolic Panel: Recent Labs  Lab 11/08/21 0420 11/09/21 0606 11/10/21 0829 11/11/21 0553 11/11/21 0558 11/12/21 0315  NA 135 138 137 136  --  135  K 3.8 4.0 4.0 4.2  --  4.8  CL 91* 96* 94* 94*  --  94*  CO2 _0 --  24  GLUCOSE 135* 148* 112* 124*  --  134*  BUN 48* 85* 51* 80*  --  103*  CREATININE 3.29* 4.83* 3.44* 4.75*  --  5.77*  CALCIUM 9.2 9.3 9.4 9.3  --  9.5  MG 1.5* 1.8 1.6*  --  1.7 2.0  PHOS 2.8 3.2 3.1 2.8  --  2.8     Liver Function Tests: Recent Labs  Lab 11/08/21 0420 11/09/21 0606 11/10/21 0829 11/11/21 0553 11/12/21 0315  ALBUMIN 2.7* 2.6* 2.8* 2.5* 2.5*    No results for input(s): "LIPASE", "AMYLASE" in the last 168 hours. No results for input(s): "AMMONIA" in the last 168 hours.  CBC: Recent Labs  Lab 11/08/21 0420 11/09/21 0606 11/10/21 0829 11/11/21 0558 11/12/21 0315  WBC 13.6* 13.1* 12.4* 10.9* 9.1  HGB 8.5* 8.3* 9.6* 8.7* 8.3*  HCT 28.8* 27.4* 32.9* 29.7* 28.3*  MCV 98.3 97.5 99.4 97.7 99.0  PLT 393 440* 442* 437* 432*      Cardiac Enzymes: No results for input(s): "CKTOTAL", "CKMB", "CKMBINDEX", "TROPONINI" in the last 168 hours.   BNP: BNP (last 3 results) Recent Labs    12/24/20 0651 09/28/21 0405  BNP 412.0* 826.6*     ProBNP (last 3 results) No results for input(s): "PROBNP" in the last 8760 hours.    Other results:  Imaging: DG Chest Port 1 View  Result Date: 11/12/2021 CLINICAL DATA:  Shortness of breath.  Recent CABG. EXAM: PORTABLE CHEST 1 VIEW COMPARISON:  Chest radiograph November 06, 2021, CT chest 09/28/2021 FINDINGS: Postsurgical changes from median sternotomy and CABG. There is an enteric tube that courses below diaphragm with the tip out of the field of view. Left-sided tunneled central venous catheter in place with the tip in the mid SVC. Surgical clips are also seen in the right axilla. Cardiac and mediastinal contours are unchanged compared to recent chest radiograph. Redemonstrated are bibasilar pulmonary opacities and likely a small to moderate-sized left-sided pleural effusion. The hazy pulmonary opacity at the right lung base appears slightly more conspicuous on this exam. There are no displaced rib fractures. There are degenerative changes at the right-greater-than-left [facility] joint. Visualized upper abdomen is unremarkable. IMPRESSION: 1. Persistent bibasilar pulmonary opacities and likely small to moderate-sized left-sided pleural effusion. The hazy pulmonary opacity at the right lung base appears slightly more conspicuous on this exam, which could represent progressive atelectasis and a component of pulmonary edema. 2. Support apparatus as above. Electronically Signed   By: Marin Roberts M.D.   On: 11/12/2021 08:17     Medications:     Scheduled Medications:  sodium chloride   Intravenous Once   ascorbic acid  250 mg Per Tube BID   aspirin  81 mg Per Tube Daily   Chlorhexidine Gluconate Cloth  6 each Topical Daily   darbepoetin (ARANESP) injection - DIALYSIS  100 mcg  Intravenous Q Tue-HD   feeding supplement (PROSource TF20)  60 mL Per Tube TID   feeding supplement (VITAL 1.5 CAL)  1,000 mL Per Tube Q24H   fentaNYL  1 patch Transdermal Q72H   fiber supplement (BANATROL TF)  60 mL Per Tube QID   folic acid  1 mg Per Tube Daily   Gerhardt's butt cream   Topical Daily   heparin injection (subcutaneous)  5,000 Units Subcutaneous Q8H   insulin aspart  0-20 Units Subcutaneous Q4H   insulin aspart  2 Units Subcutaneous Q4H   insulin glargine-yfgn  10 Units Subcutaneous QHS   midodrine  20 mg Per Tube TID WC   multivitamin  1 tablet Per Tube QHS   nutrition supplement (JUVEN)  1 packet Per Tube BID BM   mouth rinse  15 mL Mouth Rinse 4 times per day   pantoprazole  40 mg Per Tube BID   QUEtiapine  50 mg Per Tube QHS   sodium chloride flush  3 mL Intravenous Q12H   sodium hypochlorite   Topical BID   thiamine  100 mg Per Tube Daily    Infusions:  sodium chloride Stopped (11/09/21 0715)   sodium chloride Stopped (11/04/21 1021)   albumin human 12.5 g (11/01/21 2248)   anticoagulant sodium citrate     norepinephrine (LEVOPHED) Adult infusion Stopped (11/06/21 0631)    PRN Medications: sodium chloride, acetaminophen (TYLENOL) oral liquid 160 mg/5 mL, albumin human, anticoagulant sodium citrate, camphor-menthol, clonazePAM, dextrose, diphenhydrAMINE, heparin, heparin, heparin, HYDROmorphone (DILAUDID) injection, ipratropium-albuterol, ondansetron (ZOFRAN) IV, mouth rinse, [EXPIRED] oxyCODONE **FOLLOWED BY** oxyCODONE, polyethylene glycol, polyvinyl alcohol, sodium chloride flush, white petrolatum   Assessment/Plan:    1. Shock - mixed cardiogenic/hemorrhagic -> VA ECMO -> decannulated on 8/28 to Impella 5.5 - Echo 08/28: Underfilled LV, EF 55-60% with severe LVH and near normal RV systsystolic function.  - Impella extracted 9/7 -Bedside echo 09/13 - LV function preserved, RV mildly reduced. Small clot in posterior pericardium but no hemodynamic effect   - Completed total of 14 days of Micafungin (ended 9/27) -  Off pressors.  - On midodrine 20 mg tid.   2. Cardiac arrest (PEA/bradycardic) - 8/19 in setting of tamponade - PEA arrest again on 08/28  3. Cardiac tamponade with emergent bedside sternotomy  - Diffuse epicardial bleeding with post-op Dresslers - return to OR 8/21 and 8/24 for washouts.  - Washout 8/28 in OR. Multiple units of blood products in OR.   - Chest closed on 9/2 - Hgb stable 8.3  4. Acute hypoxemic respiratory failure - Off ECMO.  -  Perc Trach placed 9/7  - s/p bronchoscopy w/ BAL 9/4.  - tracheal aspirate 09/13 growing few GPC and rare yeast  - Started back on IV abx as above d/t concern for sepsis.  - Trach downsized - Trach de cannulated 9/25  -Resolved.On room air.   5. AKI due to ATN - CRRT started 08/28.  - Remains on anuric - Transitioned to [facility] 9/21. Nephrology following  - s/p [facility] cath by IR   - tolerating iHD  6. 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 Now off colchicine   7. 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 Continue aspirin 81 mg daily  8. DM2 - continue SSI   9. PAF/AFL - Tolerates poorly.  - Recurrent AFL. S/p DCCV  to SR 08/29.  - Off amio d/t bradycardia - Not on [facility] for now d/t bleeding, DVT dose heparin.  - In NSR  10. ID - Completed vancomycin/meropenem with open chest.  - - Possible aspiration with vomiting.  - now off meropenum   - WBC down to 10.9   11. Neuro Oriented x3  12. FEN - TFs ongoing  - SLP following   13. Hyperkalemia - resolved - getting iHD   14. Unstageable Pressure Ulcer, Buttock  - WOC following.  - Needs hypdrotherapy.  - Continues to reposition  R/L  15. Abdominal Pain - Resolved.   Disposition- SNF. Not eligible for LTAC due to insurance.    Glori Bickers, MD  9:33 AM

## 2021-11-12 NOTE — TOC CM/SW Note (Signed)
HF TOC CM received message from wife, that she spoke to [employer] disability rep, and she is working on submitting necessary paperwork by deadline 11/18/2021. She needed the SS form [id number] signed by patient. CM reviewed with pt and explained document. Pt signed form. Scanned form back to wife. Received message from HD Navigator, Olivia Mackie and states she is following pt for possible outpt HD but pt will need to have insurance in place prior to arrange outpt HD. [place], Heart Failure TOC CM [phone]

## 2021-11-12 NOTE — Consult Note (Signed)
[facility] Nurse wound follow up Patient receiving care in [place]. Wound type: unstageable PI to buttocks/coccyx Measurement: butterfly shaped wound measures 14 cm x 11 cm Wound bed: white/pink/tan/brown Drainage (amount, consistency, odor) blue green and brown on existing dressing Periwound: intact Dressing procedure/placement/frequency: Santy d/cd. I started the following: Moisten gauze with 1/4% Dakin's solution. Place gauze over the entire wound areas (not the intact skin around the wound). Cover with a foam dressing. Val Riles, RN, MSN, CWOCN, CNS-BC, pager [phone]

## 2021-11-13 LAB — RENAL FUNCTION PANEL
Albumin: 2.5 g/dL — ABNORMAL LOW (ref 3.5–5.0)
Anion gap: 16 — ABNORMAL HIGH (ref 5–15)
BUN: 58 mg/dL — ABNORMAL HIGH (ref 8–23)
CO2: 27 mmol/L (ref 22–32)
Calcium: 9.3 mg/dL (ref 8.9–10.3)
Chloride: 92 mmol/L — ABNORMAL LOW (ref 98–111)
Creatinine, Ser: 3.81 mg/dL — ABNORMAL HIGH (ref 0.61–1.24)
GFR, Estimated: 17 mL/min — ABNORMAL LOW (ref >60)
Glucose, Bld: 91 mg/dL (ref 70–99)
Phosphorus: 3.6 mg/dL (ref 2.5–4.6)
Potassium: 4.4 mmol/L (ref 3.5–5.1)
Sodium: 135 mmol/L (ref 135–145)

## 2021-11-13 LAB — CBC
HCT: 31 % — ABNORMAL LOW (ref 39.0–52.0)
Hemoglobin: 9 g/dL — ABNORMAL LOW (ref 13.0–17.0)
MCH: 28.4 pg (ref 26.0–34.0)
MCHC: 29 g/dL — ABNORMAL LOW (ref 30.0–36.0)
MCV: 97.8 fL (ref 80.0–100.0)
Platelets: 418 10*3/uL — ABNORMAL HIGH (ref 150–400)
RBC: 3.17 MIL/uL — ABNORMAL LOW (ref 4.22–5.81)
RDW: 18.6 % — ABNORMAL HIGH (ref 11.5–15.5)
WBC: 9.5 10*3/uL (ref 4.0–10.5)
nRBC: 0.2 % (ref 0.0–0.2)

## 2021-11-13 LAB — GLUCOSE, CAPILLARY
Glucose-Capillary: 125 mg/dL — ABNORMAL HIGH (ref 70–99)
Glucose-Capillary: 139 mg/dL — ABNORMAL HIGH (ref 70–99)
Glucose-Capillary: 147 mg/dL — ABNORMAL HIGH (ref 70–99)
Glucose-Capillary: 153 mg/dL — ABNORMAL HIGH (ref 70–99)
Glucose-Capillary: 164 mg/dL — ABNORMAL HIGH (ref 70–99)
Glucose-Capillary: 93 mg/dL (ref 70–99)

## 2021-11-13 LAB — MAGNESIUM: Magnesium: 1.6 mg/dL — ABNORMAL LOW (ref 1.7–2.4)

## 2021-11-13 MED ORDER — LOPERAMIDE HCL 1 MG/7.5ML PO SUSP
2.0000 mg | ORAL | Status: DC | PRN
  Administered 2021-11-13 – 2021-11-22 (×19): 2 mg
  Filled 2021-11-13 (×24): qty 15

## 2021-11-13 MED ORDER — MAGNESIUM SULFATE IN D5W 1-5 GM/100ML-% IV SOLN
1.0000 g | Freq: Once | INTRAVENOUS | Status: AC
  Administered 2021-11-13: 1 g via INTRAVENOUS
  Filled 2021-11-13: qty 100

## 2021-11-13 NOTE — Progress Notes (Signed)
Nutrition Follow-up  DOCUMENTATION CODES:   Not applicable  INTERVENTION:   Recommend PEG placement. Discussed with Dr. Cyndia Bent who agrees and plan to place IR consult for PEG placement -Once PEG tube placed, will transition to bolus feedings. Hopefully the intermittent feeding with breaks during the day will help with diarrhea  Tube Feedings via Cortrak:  Vital 1.5 at 60 ml/hr Pro-source TF20 60 mL TID This provides 2400 kcals, 157 g of protein and 1094 mL of free water   Continue Juven BID, each packet provides 80 calories, 8 grams of carbohydrate, 2.5  grams of protein (collagen), 7 grams of L-arginine and 7 grams of L-glutamine; supplement contains CaHMB, Vitamins C, E, B12 and Zinc to promote wound healing   Continue Vitamin C, Zinc, Folic Acid, Renal MVI supplementation  Continue Banatrol TF QID  Received consult for diet education: new start iHD. RD to hold off on diet education until appropriate.   NUTRITION DIAGNOSIS:   Inadequate oral intake related to acute illness as evidenced by NPO status.  Not Met  GOAL:   Patient will meet greater than or equal to 90% of their needs  Progressing  MONITOR:   Vent status, Labs, Weight trends, TF tolerance  REASON FOR ASSESSMENT:   Ventilator    ASSESSMENT:   61 yo male admitted with acute on chronic CHF with recent CABG, symptomatic bradycardia and hypotension. PMH includes DM, CAD s/p recent CABG x 5, CKD 3, CVA  Pt continues with iHD TTS, currently receiving iHD in bed in room (not going to iHD unit or sitting up in chair)  Vital 1.5 at 60 ml/hr infusing via Cortrak with Pro-source TF and Juven. Cortrak and/or NG tube have been in place since 8/21. Pt expresses that the Cortrak is causing him some discomfort Noted issue with possible clog with Cortrak overnight, no issues currently  Rectal tube came out over night; pt with difficulty keeping tube in place, likely secondary to poor rectal tone. Pt did not want  rectal tube reinserted due to pain. +liquid stool today while in bed, pt did get up and use the bedside commode as well but this tired him out. Pt receiving Banatrol TF    Pt remains severely deconditioned. Pt is a 2 person assist with transfers, needing assistance with bed mobility. Pt is not currently ambulating.   Pt remains NPO, awaiting possible MBS. Failed FEES x 2.    Dietitian also received consult for Diet Education today as pt new to iHD. RD will plan to discuss renal diet once pt diet advanced and pt eating adequately by mouth. RD does not recommend dietary restrictions once diet advanced until pt able to demonstrate adequate po intake.   Pt continues to complain of pain 2/2 wounds on backside  Labs: reviewed Meds: Vitamin C, aresp, banatrol, folic acid, rena-vite, juven BID, thiamine    Diet Order:   Diet Order             Diet NPO time specified  Diet effective midnight                   EDUCATION NEEDS:   Not appropriate for education at this time  Skin:  Skin Assessment: Skin Integrity Issues: Skin Integrity Issues:: Unstageable Stage II: R upper buttock, L ischium Unstageable: L upper  buttock, gluteal cleft/distal sacrum Incisions: chest (closed, wound VAC removed) Other: sheer/friction to right scapula region  Last BM:  10/4 type 7, rectal tube out  Height:   Ht  Readings from Last 1 Encounters:  10/23/21 _0  (1.702 m)    Weight:   Wt Readings from Last 1 Encounters:  11/13/21 88.5 kg     BMI:  Body mass index is 30.56 kg/m.  Estimated Nutritional Needs:   Kcal:  2200-2400 kcals  Protein:  135-165 g  Fluid:  1.8 L   Kerman Passey MS, RDN, LDN, CNSC Registered Dietitian 3 Clinical Nutrition RD Pager and On-Call Pager Number Located in [place]

## 2021-11-13 NOTE — Progress Notes (Signed)
Patient ID: [name], male   DOB: October 30, 1960, 61 y.o.   MRN: [id number] [facility] Progress Note   Assessment/ Plan:   1.  End-stage renal disease: Anuric and with prolonged dialysis dependent acute kidney injury.  Etiology thought to be ischemic ATN in the setting of cardiogenic shock/cardiac arrest.  Started on CRRT 10/07/2021 and has recently been transitioned to intermittent hemodialysis via left IJ TDC.  Process initiated for outpatient dialysis unit placement and getting hemodialysis here on a TTS schedule-next dialysis tomorrow that can be done in [place]. 2.  History of cardiogenic shock/cardiac arrest: With previous cardiac tamponade/hemorrhagic pericarditis status post pericardial drain placement and requirement of ECMO.  Underwent mediastinal washout with sternal closure on 9/2 and continues to make gradual improvement. 3.  Anemia of critical illness with acute blood loss anemia: Continue Aranesp with next dose due tomorrow.  Significantly low iron saturation noted on 11/04/2021 and getting intravenous iron. 4.  Coronary artery disease status post 5 vessel CABG on 09/12/2021  Subjective:   Reports not having slept well overnight due to discomfort from his decubitus/multiple tubes   Objective:   BP (!) 99/58 (BP Location: Left Arm)   Pulse 87   Temp 99.1 F (37.3 C) (Oral)   Resp (!) 23   Ht 5\' 7"  (1.702 m)   Wt 88.5 kg   SpO2 99%   BMI 30.56 kg/m   Intake/Output Summary (Last 24 hours) at 11/13/2021 0850 Last data filed at 11/12/2021 1600 Gross per 24 hour  Intake 853 ml  Output 2500 ml  Net -1647 ml   Weight change: -8.8 kg  Physical Exam: Gen: Appears uncomfortable resting on his right side, awakens to conversation CVS: Pulse regular rhythm, normal rate, S1 and S2 sounds distant Resp: Clear to auscultation bilaterally, no distinct rales or rhonchi Abd: Firm, protuberant, bowel sounds normal Ext: Trace lower extremity edema  Imaging: DG Chest Port 1  View  Result Date: 11/12/2021 CLINICAL DATA:  Shortness of breath.  Recent CABG. EXAM: PORTABLE CHEST 1 VIEW COMPARISON:  Chest radiograph November 06, 2021, CT chest 09/28/2021 FINDINGS: Postsurgical changes from median sternotomy and CABG. There is an enteric tube that courses below diaphragm with the tip out of the field of view. Left-sided tunneled central venous catheter in place with the tip in the mid SVC. Surgical clips are also seen in the right axilla. Cardiac and mediastinal contours are unchanged compared to recent chest radiograph. Redemonstrated are bibasilar pulmonary opacities and likely a small to moderate-sized left-sided pleural effusion. The hazy pulmonary opacity at the right lung base appears slightly more conspicuous on this exam. There are no displaced rib fractures. There are degenerative changes at the right-greater-than-left [facility] joint. Visualized upper abdomen is unremarkable. IMPRESSION: 1. Persistent bibasilar pulmonary opacities and likely small to moderate-sized left-sided pleural effusion. The hazy pulmonary opacity at the right lung base appears slightly more conspicuous on this exam, which could represent progressive atelectasis and a component of pulmonary edema. 2. Support apparatus as above. Electronically Signed   By: Marin Roberts M.D.   On: 11/12/2021 08:17    Labs: BMET Recent Labs  Lab 11/07/21 0229 11/08/21 0420 11/09/21 0606 11/10/21 0829 11/11/21 0553 11/12/21 0315 11/13/21 0311  NA 135 135 138 137 136 135 135  K 4.2 3.8 4.0 4.0 4.2 4.8 4.4  CL 93* 91* 96* 94* 94* 94* 92*  CO2 24 28 26 27 24 24 27   GLUCOSE 168* 135* 148* 112* 124* 134* 91  BUN 91* 48* 85* 51* 80* 103* 58*  CREATININE 5.20* 3.29* 4.83* 3.44* 4.75* 5.77* 3.81*  CALCIUM 9.2 9.2 9.3 9.4 9.3 9.5 9.3  PHOS 3.7 2.8 3.2 3.1 2.8 2.8 3.6   CBC Recent Labs  Lab 11/10/21 0829 11/11/21 0558 11/12/21 0315 11/13/21 0311  WBC 12.4* 10.9* 9.1 9.5  HGB 9.6* 8.7* 8.3* 9.0*  HCT 32.9* 29.7*  28.3* 31.0*  MCV 99.4 97.7 99.0 97.8  PLT 442* 437* 432* 418*    Medications:     sodium chloride   Intravenous Once   ascorbic acid  250 mg Per Tube BID   aspirin  81 mg Per Tube Daily   Chlorhexidine Gluconate Cloth  6 each Topical Daily   darbepoetin (ARANESP) injection - DIALYSIS  100 mcg Intravenous Q Tue-HD   feeding supplement (PROSource TF20)  60 mL Per Tube TID   feeding supplement (VITAL 1.5 CAL)  1,000 mL Per Tube Q24H   fentaNYL  1 patch Transdermal Q72H   fiber supplement (BANATROL TF)  60 mL Per Tube QID   folic acid  1 mg Per Tube Daily   Gerhardt's butt cream   Topical Daily   heparin injection (subcutaneous)  5,000 Units Subcutaneous Q8H   insulin aspart  0-20 Units Subcutaneous Q4H   insulin aspart  2 Units Subcutaneous Q4H   insulin glargine-yfgn  10 Units Subcutaneous QHS   midodrine  20 mg Per Tube TID WC   multivitamin  1 tablet Per Tube QHS   nutrition supplement (JUVEN)  1 packet Per Tube BID BM   mouth rinse  15 mL Mouth Rinse 4 times per day   pantoprazole  40 mg Per Tube BID   QUEtiapine  50 mg Per Tube QHS   sodium chloride flush  3 mL Intravenous Q12H   sodium hypochlorite   Topical BID   thiamine  100 mg Per Tube Daily   Elmarie Shiley, MD 11/13/2021, 8:50 AM

## 2021-11-13 NOTE — Progress Notes (Addendum)
Attempted to meet with pt at bedside but pt currently unavailable. Will f/u again later today if possible.   Melven Sartorius Renal Navigator [phone]  Addendum at 1:30 pm: Attempted to meet with pt at bedside this afternoon. Pt asleep and unable to wake pt up. Message left for pt's wife requesting a return call. Will await a return call.   Addendum at 2:30 pm: Spoke to pt's wife via phone. Introduced self and explained role. Wife states pt will want a clinic close to his home. Wife states pt will likely want to drive self if pt able to do so at d/c. Wife states that if pt is unable to drive self, then pt will need transportation services. Will provide this info to [facility] staff for assistance with these resources at d/c. Pt is currently showing uninsured with medicaid pending. Wife states that pt may have coverage under a cobra policy and she is in the process of trying to obtain information regarding this policy. Requested that wife contact navigator with that info should she be able to obtain it. Referral submitted to [facility] for review. Will assist as needed.

## 2021-11-13 NOTE — Progress Notes (Signed)
Patient ID: [name], male   DOB: 1960/08/30, 61 y.o.   MRN: [id number]   Advanced Heart Failure Rounding Note   Subjective:    - 8/19 Pericardial window - 8/20 Cardiac arrest with tamponade -> Emergent bedside washout - 09/29/21 VA Cannulation - 09/30/21 Return to OR for mediastinal hemorrhage - 10/01/21 Developed AF -> amio - 10/02/21 TEE EF 25-30%  - 10/04/21 OR for washout. C/b continued bleeding overnight - 10/07/21 Placement of Impella 5.5 with washout, VA ECMO decannulation. Hypotensive with development of severe RV dysfunction after ECMO off and pressors titrated up. Multiple units of blood products. - 10/08/21 Brief PEA arrest. AFL with RVR >> S/p DCCV to SR, back in AFL shortly after - 8/31 Give 1UPRBCs  - 9/2 OR for chest closure - 9/3 Hypotensive overnight w/ SBPs in 80s. Febrile, mTemp 100.8. CRRT paused. VP increased to 0.04.  - 9/4 s/p bronchoscopy w/ BAL by PCCM, Cx NGTD  - 9/5 vomiting w/ large volume NGT output + watery/foul diarrhea, Tube feeds held. No signs of ileus on KUB. C-diff negative   - 9/7 OR for Impella Extraction and percutaneous tracheostomy. 2 u RBCs. - 9/8 CVVH stopped and line removed for holiday - 9/9 CVVH restarted.  -9/13 Worsening leukocytosis and pressor requirements. Purulent drainage from arterial line. Started Daptomycin, Micafungin and Meropenem. - 9/17 Daptomycin + Micafungin stopped. Remains on Meropenum  -9/19 CRRT holiday.  -9/20 Trach downsized  -9/21 transitioned to iHD  - 9/25 Tunneled HD cath placed. Trach decannulation - 9/26 Hgb 6.1>>transfused 2uRBC   Feels ok. Weak. Butt still hurts. Tolerating iHD.   Objective:     Vital Signs:   Temp:  [97.9 F (36.6 C)-99.1 F (37.3 C)] 99.1 F (37.3 C) (10/04 0728) Pulse Rate:  [85-129] 85 (10/04 0900) Resp:  [20-42] 23 (10/04 0900) BP: (91-171)/(53-109) 99/54 (10/04 0900) SpO2:  [89 %-100 %] 97 % (10/04 0900) Weight:  [88.5 kg] 88.5 kg (10/04 0500) Last BM Date :  11/13/21  Weight change: Filed Weights   11/11/21 0425 11/12/21 0325 11/13/21 0500  Weight: 89.3 kg 97.3 kg 88.5 kg    Intake/Output:   Intake/Output Summary (Last 24 hours) at 11/13/2021 0944 Last data filed at 11/13/2021 0800 Gross per 24 hour  Intake 1304 ml  Output 2500 ml  Net -1196 ml     General:  Weak appearing. No resp difficulty HEENT: normal +Cor-trak Neck: supple. no JVD. Carotids 2+ bilat; no bruits. No lymphadenopathy or thryomegaly appreciated. Cor: PMI nondisplaced. Regular rate & rhythm. No rubs, gallops or murmurs. Lungs: clear Abdomen: soft, nontender, nondistended. No hepatosplenomegaly. No bruits or masses. Good bowel sounds. Extremities: no cyanosis, clubbing, rash, edema Neuro: alert & orientedx3, cranial nerves grossly intact. moves all 4 extremities w/o difficulty. Affect pleasant   Telemetry: SR 80-90s Personally reviewed  Labs: Basic Metabolic Panel: Recent Labs  Lab 11/09/21 0606 11/10/21 0829 11/11/21 0553 11/11/21 0558 11/12/21 0315 11/13/21 0311  NA 138 137 136  --  135 135  K 4.0 4.0 4.2  --  4.8 4.4  CL 96* 94* 94*  --  94* 92*  CO2 _0 --  24 27  GLUCOSE 148* 112* 124*  --  134* 91  BUN 85* 51* 80*  --  103* 58*  CREATININE 4.83* 3.44* 4.75*  --  5.77* 3.81*  CALCIUM 9.3 9.4 9.3  --  9.5 9.3  MG 1.8 1.6*  --  1.7 2.0 1.6*  PHOS 3.2 3.1 2.8  --  2.8 3.6     Liver Function Tests: Recent Labs  Lab 11/09/21 0606 11/10/21 0829 11/11/21 0553 11/12/21 0315 11/13/21 0311  ALBUMIN 2.6* 2.8* 2.5* 2.5* 2.5*    No results for input(s): "LIPASE", "AMYLASE" in the last 168 hours. No results for input(s): "AMMONIA" in the last 168 hours.  CBC: Recent Labs  Lab 11/09/21 0606 11/10/21 0829 11/11/21 0558 11/12/21 0315 11/13/21 0311  WBC 13.1* 12.4* 10.9* 9.1 9.5  HGB 8.3* 9.6* 8.7* 8.3* 9.0*  HCT 27.4* 32.9* 29.7* 28.3* 31.0*  MCV 97.5 99.4 97.7 99.0 97.8  PLT 440* 442* 437* 432* 418*     Cardiac Enzymes: No  results for input(s): "CKTOTAL", "CKMB", "CKMBINDEX", "TROPONINI" in the last 168 hours.   BNP: BNP (last 3 results) Recent Labs    12/24/20 0651 09/28/21 0405  BNP 412.0* 826.6*     ProBNP (last 3 results) No results for input(s): "PROBNP" in the last 8760 hours.    Other results:  Imaging: DG Chest Port 1 View  Result Date: 11/12/2021 CLINICAL DATA:  Shortness of breath.  Recent CABG. EXAM: PORTABLE CHEST 1 VIEW COMPARISON:  Chest radiograph November 06, 2021, CT chest 09/28/2021 FINDINGS: Postsurgical changes from median sternotomy and CABG. There is an enteric tube that courses below diaphragm with the tip out of the field of view. Left-sided tunneled central venous catheter in place with the tip in the mid SVC. Surgical clips are also seen in the right axilla. Cardiac and mediastinal contours are unchanged compared to recent chest radiograph. Redemonstrated are bibasilar pulmonary opacities and likely a small to moderate-sized left-sided pleural effusion. The hazy pulmonary opacity at the right lung base appears slightly more conspicuous on this exam. There are no displaced rib fractures. There are degenerative changes at the right-greater-than-left [facility] joint. Visualized upper abdomen is unremarkable. IMPRESSION: 1. Persistent bibasilar pulmonary opacities and likely small to moderate-sized left-sided pleural effusion. The hazy pulmonary opacity at the right lung base appears slightly more conspicuous on this exam, which could represent progressive atelectasis and a component of pulmonary edema. 2. Support apparatus as above. Electronically Signed   By: Marin Roberts M.D.   On: 11/12/2021 08:17     Medications:     Scheduled Medications:  sodium chloride   Intravenous Once   ascorbic acid  250 mg Per Tube BID   aspirin  81 mg Per Tube Daily   Chlorhexidine Gluconate Cloth  6 each Topical Daily   darbepoetin (ARANESP) injection - DIALYSIS  100 mcg Intravenous Q Tue-HD   feeding  supplement (PROSource TF20)  60 mL Per Tube TID   feeding supplement (VITAL 1.5 CAL)  1,000 mL Per Tube Q24H   fentaNYL  1 patch Transdermal Q72H   fiber supplement (BANATROL TF)  60 mL Per Tube QID   folic acid  1 mg Per Tube Daily   Gerhardt's butt cream   Topical Daily   heparin injection (subcutaneous)  5,000 Units Subcutaneous Q8H   insulin aspart  0-20 Units Subcutaneous Q4H   insulin aspart  2 Units Subcutaneous Q4H   insulin glargine-yfgn  10 Units Subcutaneous QHS   midodrine  20 mg Per Tube TID WC   multivitamin  1 tablet Per Tube QHS   nutrition supplement (JUVEN)  1 packet Per Tube BID BM   mouth rinse  15 mL Mouth Rinse 4 times per day   pantoprazole  40 mg Per Tube BID   QUEtiapine  50 mg Per Tube QHS   sodium  chloride flush  3 mL Intravenous Q12H   sodium hypochlorite   Topical BID   thiamine  100 mg Per Tube Daily    Infusions:  sodium chloride Stopped (11/09/21 0715)   sodium chloride Stopped (11/04/21 1021)   albumin human 12.5 g (11/01/21 2248)   anticoagulant sodium citrate     magnesium sulfate bolus IVPB      PRN Medications: sodium chloride, acetaminophen (TYLENOL) oral liquid 160 mg/5 mL, albumin human, anticoagulant sodium citrate, camphor-menthol, clonazePAM, dextrose, diphenhydrAMINE, heparin, heparin, heparin, HYDROmorphone (DILAUDID) injection, ipratropium-albuterol, ondansetron (ZOFRAN) IV, mouth rinse, [EXPIRED] oxyCODONE **FOLLOWED BY** oxyCODONE, polyethylene glycol, polyvinyl alcohol, sodium chloride flush, white petrolatum   Assessment/Plan:    1. Shock - mixed cardiogenic/hemorrhagic -> VA ECMO -> decannulated on 8/28 to Impella 5.5 - Echo 08/28: Underfilled LV, EF 55-60% with severe LVH and near normal RV systsystolic function.  - Impella extracted 9/7 -Bedside echo 09/13 - LV function preserved, RV mildly reduced. Small clot in posterior pericardium but no hemodynamic effect  - Completed total of 14 days of Micafungin (ended 9/27) -  Off  pressors.  - On midodrine 20 mg tid. BP soft but stable   2. Cardiac arrest (PEA/bradycardic) - 8/19 in setting of tamponade - PEA arrest again on 08/28  3. Cardiac tamponade with emergent bedside sternotomy  - Diffuse epicardial bleeding with post-op Dresslers - return to OR 8/21 and 8/24 for washouts.  - Washout 8/28 in OR. Multiple units of blood products in OR.   - Chest closed on 9/2 - Hgb stable 9.0  4. Acute hypoxemic respiratory failure - Off ECMO.  -  Perc Trach placed 9/7  - s/p bronchoscopy w/ BAL 9/4.  - tracheal aspirate 09/13 growing few GPC and rare yeast  - Started back on IV abx as above d/t concern for sepsis.  - Trach downsized - Trach de cannulated 9/25  -Resolved.On room air.   5. AKI due to ATN - CRRT started 08/28.  - Remains on anuric - Transitioned to [facility] 9/21. Nephrology following  - s/p [facility] cath by IR   - tolerating iHD. D/w Renal  6. 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 Now off colchicine   7. 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 Continue aspirin 81 mg daily  8. DM2 - continue SSI   9. PAF/AFL - Tolerates poorly.  - Recurrent AFL. S/p DCCV  to SR 08/29.  - Off amio d/t bradycardia - Not on [facility] for now d/t bleeding, DVT dose heparin.  - In NSR  10. ID - Completed vancomycin/meropenem with open chest.  - - Possible aspiration with vomiting.  - now off meropenum   - WBC down to 10.9   11. Neuro Oriented x3  12. FEN - TFs ongoing  - SLP following   13. Hyperkalemia - resolved - getting iHD   14. Unstageable Pressure Ulcer, Buttock  - WOC following.  - Needs hypdrotherapy.  - Continues to reposition  R/L  15. Abdominal Pain - Resolved.   Disposition- SNF. Not eligible for LTAC due to insurance.    Glori Bickers, MD  9:44 AM

## 2021-11-13 NOTE — Progress Notes (Signed)
Patient ID: [name], male   DOB: 1960-04-20, 61 y.o.   MRN: [id number] TCTS Evening Rounds:  Hemodynamically stable.  Has been having frequent loose stools from tube feeds. Has been up sitting on bedside commode multiple times today. Will add Imodium.   Discussed PEG tube with dietician and I think that is the best option at this point because even if he passes MBS it will be a while until he can take adequate po to stop tube feeds.Will consult IR.

## 2021-11-13 NOTE — Progress Notes (Signed)
27 Days Post-Op Procedure(s) (LRB): REMOVAL OF IMPELLA LEFT VENTRICULAR ASSIST DEVICE (Right) TRANSESOPHAGEAL ECHOCARDIOGRAM (TEE) (N/A) PERCUTANEOUS TRACHEOSTOMY USING SHILEY FLEXIBLE 8 mm CUFFED TRACH. (N/A) Subjective: Says he is tired. Did not sleep well.  Objective: Vital signs in last 24 hours: Temp:  [97.9 F (36.6 C)-99.1 F (37.3 C)] 99.1 F (37.3 C) (10/04 0728) Pulse Rate:  [87-129] 87 (10/04 0629) Cardiac Rhythm: Normal sinus rhythm (10/04 0000) Resp:  [19-42] 23 (10/04 0629) BP: (91-171)/(57-109) 99/58 (10/04 0629) SpO2:  [89 %-100 %] 99 % (10/04 0629) Weight:  [88.5 kg] 88.5 kg (10/04 0500)  Hemodynamic parameters for last 24 hours:    Intake/Output from previous day: 10/03 0701 - 10/04 0700 In: 853 [NG/GT:853] Out: 2530 [Stool:30] Intake/Output this shift: No intake/output data recorded.  General appearance: sleepy but answers questions Neurologic: answers appropriately and moving all ext to command Heart: regular rate and rhythm, S1, S2 normal, no murmur Lungs: clear to auscultation bilaterally Abdomen: soft, non-tender; bowel sounds normal; no masses,  no organomegaly Extremities: no edema Wound: chest wound granulating well.  Lab Results: Recent Labs    11/12/21 0315 11/13/21 0311  WBC 9.1 9.5  HGB 8.3* 9.0*  HCT 28.3* 31.0*  PLT 432* 418*   BMET:  Recent Labs    11/12/21 0315 11/13/21 0311  NA 135 135  K 4.8 4.4  CL 94* 92*  CO2 24 27  GLUCOSE 134* 91  BUN 103* 58*  CREATININE 5.77* 3.81*  CALCIUM 9.5 9.3    PT/INR: No results for input(s): "LABPROT", "INR" in the last 72 hours. ABG    Component Value Date/Time   PHART 7.301 (L) 10/22/2021 2354   HCO3 19.3 (L) 10/22/2021 2354   TCO2 20 (L) 10/22/2021 2354   ACIDBASEDEF 7.0 (H) 10/22/2021 2354   O2SAT 71.7 11/02/2021 0439   CBG (last 3)  Recent Labs    11/12/21 2329 11/13/21 0418 11/13/21 0726  GLUCAP 100* 93 125*    Assessment/Plan: S/P Procedure(s) (LRB): REMOVAL  OF IMPELLA LEFT VENTRICULAR ASSIST DEVICE (Right) TRANSESOPHAGEAL ECHOCARDIOGRAM (TEE) (N/A) PERCUTANEOUS TRACHEOSTOMY USING SHILEY FLEXIBLE 8 mm CUFFED TRACH. (N/A)  He has been hemodynamically stable on midodrine.  Tolerating iHD well.  Tolerating tube feeds at goal. Ice chips only po and plan MBS possibly later this week. He is limited by not being able to sit up due to decubitus ulcer.  No signs of systemic infection at this time with no fever and normal WBC ct.   Continue decubitus wound care. Wt to dry dressings to chest wound and chest tube sites.  DM under good control.   LOS: 46 days    [name] 11/13/2021

## 2021-11-13 NOTE — Progress Notes (Signed)
New Dialysis Start   Patient identified as new dialysis start. Kidney Education packet assembled and given. Discussed the following items with patient:    Current medications and possible changes once started:  Discussed that patient's medications may change over time.  Ex; hypertension medications and diabetes medication.  Nephrologists will adjust as needed.  Fluid restrictions reviewed:  32 oz daily goal:  All liquids count; soups, ice, jello   Phosphorus and potassium: Handout given showing high potassium and phosphorus foods.  Alternative food and drink options given.  Family support:  spoke with wife via [place]:  Discussed roles of Outpatient clinic  staff and advised to make a list of needs, if any, to talk with outpatient staff if needed  Care plan schedule: Informed family member of Care Plans in outpatient setting and to participate in the care plan.  An invitation would be given from outpatient clinic.   Dialysis Access Options:  Reviewed access options with patients. Discussed in detail about care at home with new AVG & AVF. Reviewed checking bruit and thrill. If dialysis catheter present, educated that patient could not take showers.  Catheter dressing changes were to be done by outpatient clinic staff only  Home therapy options:  Educated patient about home therapy options:  PD vs home hemo.     Patient verbalized understanding. Will continue to round on patient during admission.    Lilia Argue, RN

## 2021-11-14 ENCOUNTER — Inpatient Hospital Stay (HOSPITAL_COMMUNITY): Payer: Self-pay

## 2021-11-14 LAB — CBC
HCT: 29.3 % — ABNORMAL LOW (ref 39.0–52.0)
Hemoglobin: 8.8 g/dL — ABNORMAL LOW (ref 13.0–17.0)
MCH: 28.6 pg (ref 26.0–34.0)
MCHC: 30 g/dL (ref 30.0–36.0)
MCV: 95.1 fL (ref 80.0–100.0)
Platelets: 426 10*3/uL — ABNORMAL HIGH (ref 150–400)
RBC: 3.08 MIL/uL — ABNORMAL LOW (ref 4.22–5.81)
RDW: 17.8 % — ABNORMAL HIGH (ref 11.5–15.5)
WBC: 10.6 10*3/uL — ABNORMAL HIGH (ref 4.0–10.5)
nRBC: 0.2 % (ref 0.0–0.2)

## 2021-11-14 LAB — RENAL FUNCTION PANEL
Albumin: 2.5 g/dL — ABNORMAL LOW (ref 3.5–5.0)
Anion gap: 16 — ABNORMAL HIGH (ref 5–15)
BUN: 87 mg/dL — ABNORMAL HIGH (ref 8–23)
CO2: 24 mmol/L (ref 22–32)
Calcium: 9.1 mg/dL (ref 8.9–10.3)
Chloride: 91 mmol/L — ABNORMAL LOW (ref 98–111)
Creatinine, Ser: 5.21 mg/dL — ABNORMAL HIGH (ref 0.61–1.24)
GFR, Estimated: 12 mL/min — ABNORMAL LOW (ref >60)
Glucose, Bld: 114 mg/dL — ABNORMAL HIGH (ref 70–99)
Phosphorus: 4.2 mg/dL (ref 2.5–4.6)
Potassium: 4.6 mmol/L (ref 3.5–5.1)
Sodium: 131 mmol/L — ABNORMAL LOW (ref 135–145)

## 2021-11-14 LAB — GLUCOSE, CAPILLARY
Glucose-Capillary: 82 mg/dL (ref 70–99)
Glucose-Capillary: 174 mg/dL — ABNORMAL HIGH (ref 70–99)
Glucose-Capillary: 134 mg/dL — ABNORMAL HIGH (ref 70–99)
Glucose-Capillary: 154 mg/dL — ABNORMAL HIGH (ref 70–99)
Glucose-Capillary: 109 mg/dL — ABNORMAL HIGH (ref 70–99)

## 2021-11-14 LAB — PROTIME-INR
INR: 1.1 (ref 0.8–1.2)
Prothrombin Time: 13.6 seconds (ref 11.4–15.2)

## 2021-11-14 LAB — MAGNESIUM: Magnesium: 1.9 mg/dL (ref 1.7–2.4)

## 2021-11-14 MED ORDER — CEFAZOLIN SODIUM-DEXTROSE 2-4 GM/100ML-% IV SOLN
2.0000 g | INTRAVENOUS | Status: AC
  Filled 2021-11-14: qty 100

## 2021-11-14 MED ORDER — HEPARIN SODIUM (PORCINE) 1000 UNIT/ML DIALYSIS
40.0000 [IU]/kg | INTRAMUSCULAR | Status: DC | PRN
  Administered 2021-11-14: 3500 [IU] via INTRAVENOUS_CENTRAL

## 2021-11-14 MED ORDER — LIDOCAINE 4 % EX CREA
TOPICAL_CREAM | Freq: Three times a day (TID) | CUTANEOUS | Status: AC | PRN
  Administered 2021-11-14: 1 via TOPICAL
  Filled 2021-11-14 (×2): qty 5

## 2021-11-14 NOTE — Progress Notes (Signed)
Patient ID: [name], male   DOB: 08/22/1960, 61 y.o.   MRN: [id number]   Advanced Heart Failure Rounding Note   Subjective:    - 8/19 Pericardial window - 8/20 Cardiac arrest with tamponade -> Emergent bedside washout - 09/29/21 VA Cannulation - 09/30/21 Return to OR for mediastinal hemorrhage - 10/01/21 Developed AF -> amio - 10/02/21 TEE EF 25-30%  - 10/04/21 OR for washout. C/b continued bleeding overnight - 10/07/21 Placement of Impella 5.5 with washout, VA ECMO decannulation. Hypotensive with development of severe RV dysfunction after ECMO off and pressors titrated up. Multiple units of blood products. - 10/08/21 Brief PEA arrest. AFL with RVR >> S/p DCCV to SR, back in AFL shortly after - 8/31 Give 1UPRBCs  - 9/2 OR for chest closure - 9/3 Hypotensive overnight w/ SBPs in 80s. Febrile, mTemp 100.8. CRRT paused. VP increased to 0.04.  - 9/4 s/p bronchoscopy w/ BAL by PCCM, Cx NGTD  - 9/5 vomiting w/ large volume NGT output + watery/foul diarrhea, Tube feeds held. No signs of ileus on KUB. C-diff negative   - 9/7 OR for Impella Extraction and percutaneous tracheostomy. 2 u RBCs. - 9/8 CVVH stopped and line removed for holiday - 9/9 CVVH restarted.  -9/13 Worsening leukocytosis and pressor requirements. Purulent drainage from arterial line. Started Daptomycin, Micafungin and Meropenem. - 9/17 Daptomycin + Micafungin stopped. Remains on Meropenum  -9/19 CRRT holiday.  -9/20 Trach downsized  -9/21 transitioned to iHD  - 9/25 Tunneled HD cath placed. Trach decannulation - 9/26 Hgb 6.1>>transfused 2uRBC   Had a fall overnight. Nursing found him on floor.   Objective:     Vital Signs:   Temp:  [97.7 F (36.5 C)-98.2 F (36.8 C)] 98 F (36.7 C) (10/05 0806) Pulse Rate:  [81-94] 84 (10/05 0830) Resp:  [17-33] 22 (10/05 0830) BP: (87-163)/(49-114) 91/51 (10/05 0830) SpO2:  [93 %-100 %] 100 % (10/05 0830) Last BM Date : 11/13/21  Weight change: Filed Weights   11/11/21 0425  11/12/21 0325 11/13/21 0500  Weight: 89.3 kg 97.3 kg 88.5 kg    Intake/Output:   Intake/Output Summary (Last 24 hours) at 11/14/2021 0843 Last data filed at 11/14/2021 0800 Gross per 24 hour  Intake 1823.41 ml  Output 3 ml  Net 1820.41 ml     General:  Weak appearing. No resp difficulty HEENT: normal +Cor-trak Neck: supple. no JVD. Carotids 2+ bilat; no bruits. No lymphadenopathy or thryomegaly appreciated. Cor: PMI nondisplaced. Regular rate & rhythm. No rubs, gallops or murmurs. + HD cath Lungs: clear Abdomen: soft, nontender, nondistended. No hepatosplenomegaly. No bruits or masses. Good bowel sounds. Extremities: no cyanosis, clubbing, rash, edema Neuro: alert & orientedx3, cranial nerves grossly intact. moves all 4 extremities w/o difficulty. Affect pleasant    Telemetry: SR 80-90s Personally reviewed  Labs: Basic Metabolic Panel: Recent Labs  Lab 11/10/21 0829 11/11/21 0553 11/11/21 0558 11/12/21 0315 11/13/21 0311 11/14/21 0427  NA 137 136  --  135 135 131*  K 4.0 4.2  --  4.8 4.4 4.6  CL 94* 94*  --  94* 92* 91*  CO2 27 24  --  _0 GLUCOSE 112* 124*  --  134* 91 114*  BUN 51* 80*  --  103* 58* 87*  CREATININE 3.44* 4.75*  --  5.77* 3.81* 5.21*  CALCIUM 9.4 9.3  --  9.5 9.3 9.1  MG 1.6*  --  1.7 2.0 1.6* 1.9  PHOS 3.1 2.8  --  2.8  3.6 4.2     Liver Function Tests: Recent Labs  Lab 11/10/21 0829 11/11/21 0553 11/12/21 0315 11/13/21 0311 11/14/21 0427  ALBUMIN 2.8* 2.5* 2.5* 2.5* 2.5*    No results for input(s): "LIPASE", "AMYLASE" in the last 168 hours. No results for input(s): "AMMONIA" in the last 168 hours.  CBC: Recent Labs  Lab 11/10/21 0829 11/11/21 0558 11/12/21 0315 11/13/21 0311 11/14/21 0427  WBC 12.4* 10.9* 9.1 9.5 10.6*  HGB 9.6* 8.7* 8.3* 9.0* 8.8*  HCT 32.9* 29.7* 28.3* 31.0* 29.3*  MCV 99.4 97.7 99.0 97.8 95.1  PLT 442* 437* 432* 418* 426*     Cardiac Enzymes: No results for input(s): "CKTOTAL", "CKMB",  "CKMBINDEX", "TROPONINI" in the last 168 hours.   BNP: BNP (last 3 results) Recent Labs    12/24/20 0651 09/28/21 0405  BNP 412.0* 826.6*     ProBNP (last 3 results) No results for input(s): "PROBNP" in the last 8760 hours.    Other results:  Imaging: DG Abd 1 View  Result Date: 11/14/2021 CLINICAL DATA:  Check gastric catheter placement EXAM: ABDOMEN - 1 VIEW COMPARISON:  10/31/2021 FINDINGS: Feeding catheter is noted extending into the second portion of the duodenum. No obstructive changes are seen. IMPRESSION: Feeding catheter in the second portion of the duodenum. Electronically Signed   By: Inez Catalina M.D.   On: 11/14/2021 03:11     Medications:     Scheduled Medications:  sodium chloride   Intravenous Once   ascorbic acid  250 mg Per Tube BID   aspirin  81 mg Per Tube Daily   Chlorhexidine Gluconate Cloth  6 each Topical Daily   darbepoetin (ARANESP) injection - DIALYSIS  100 mcg Intravenous Q Tue-HD   feeding supplement (PROSource TF20)  60 mL Per Tube TID   feeding supplement (VITAL 1.5 CAL)  1,000 mL Per Tube Q24H   fentaNYL  1 patch Transdermal Q72H   fiber supplement (BANATROL TF)  60 mL Per Tube QID   folic acid  1 mg Per Tube Daily   Gerhardt's butt cream   Topical Daily   heparin injection (subcutaneous)  5,000 Units Subcutaneous Q8H   insulin aspart  0-20 Units Subcutaneous Q4H   insulin aspart  2 Units Subcutaneous Q4H   insulin glargine-yfgn  10 Units Subcutaneous QHS   midodrine  20 mg Per Tube TID WC   multivitamin  1 tablet Per Tube QHS   nutrition supplement (JUVEN)  1 packet Per Tube BID BM   mouth rinse  15 mL Mouth Rinse 4 times per day   pantoprazole  40 mg Per Tube BID   QUEtiapine  50 mg Per Tube QHS   sodium chloride flush  3 mL Intravenous Q12H   sodium hypochlorite   Topical BID   thiamine  100 mg Per Tube Daily    Infusions:  sodium chloride Stopped (11/09/21 0715)   sodium chloride Stopped (11/04/21 1021)   albumin human  12.5 g (11/01/21 2248)   anticoagulant sodium citrate      PRN Medications: sodium chloride, acetaminophen (TYLENOL) oral liquid 160 mg/5 mL, albumin human, anticoagulant sodium citrate, camphor-menthol, clonazePAM, dextrose, diphenhydrAMINE, heparin, heparin, heparin, heparin, HYDROmorphone (DILAUDID) injection, ipratropium-albuterol, loperamide HCl, ondansetron (ZOFRAN) IV, mouth rinse, [EXPIRED] oxyCODONE **FOLLOWED BY** oxyCODONE, polyethylene glycol, polyvinyl alcohol, sodium chloride flush, white petrolatum   Assessment/Plan:    1. Shock - mixed cardiogenic/hemorrhagic -> VA ECMO -> decannulated on 8/28 to Impella 5.5 - Echo 08/28: Underfilled LV, EF 55-60% with severe  LVH and near normal RV systsystolic function.  - Impella extracted 9/7 -Bedside echo 09/13 - LV function preserved, RV mildly reduced. Small clot in posterior pericardium but no hemodynamic effect  - Completed total of 14 days of Micafungin (ended 9/27) -  Off pressors.  - On midodrine 20 mg tid. BP soft but stable. No change  2. Cardiac arrest (PEA/bradycardic) - 8/19 and 8/28 in setting of tamponade - resolved  3. Cardiac tamponade with emergent bedside sternotomy  - Diffuse epicardial bleeding with post-op Dresslers - return to OR 8/21 and 8/24 for washouts.  - Washout 8/28 in OR. Multiple units of blood products in OR.   - Chest closed on 9/2 - Hgb stable 8.8  4. Acute hypoxemic respiratory failure - Off ECMO.  -  Perc Trach placed 9/7  - s/p bronchoscopy w/ BAL 9/4.  - tracheal aspirate 09/13 growing few GPC and rare yeast  - Started back on IV abx as above d/t concern for sepsis.  - Trach downsized - Trach de cannulated 9/25  -Resolved.On room air.   5. AKI due to ATN - CRRT started 08/28.  - Remains on anuric - Transitioned to [facility] 9/21. Nephrology following  - s/p [facility] cath by IR   - tolerating iHD. D/w Renal  6. 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 Now off  colchicine   7. 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 Continue aspirin 81 mg daily  8. DM2 - continue SSI   9. PAF/AFL - Tolerates poorly.  - Recurrent AFL. S/p DCCV  to SR 08/29.  - Off amio d/t bradycardia - Not on AC for now d/t bleeding, DVT dose heparin.  - In NSR toda  10. ID - Completed vancomycin/meropenem with open chest.  - - Possible aspiration with vomiting.  - now off meropenum   - WBC down to 10.9   11. FEN - TFs ongoing  - SLP following   12. Hyperkalemia - resolved - getting iHD   13. Unstageable Pressure Ulcer, Buttock  - WOC following.  - Improving  14. Fall - management per primary team  HF team will see again Monday. Please call with questions.   Glori Bickers, MD  8:43 AM

## 2021-11-14 NOTE — Progress Notes (Signed)
28 Days Post-Op Procedure(s) (LRB): REMOVAL OF IMPELLA LEFT VENTRICULAR ASSIST DEVICE (Right) TRANSESOPHAGEAL ECHOCARDIOGRAM (TEE) (N/A) PERCUTANEOUS TRACHEOSTOMY USING SHILEY FLEXIBLE 8 mm CUFFED TRACH. (N/A) Subjective: Uncomfortable but just had dressing change on sacral wound and now doing chest dressings.  Had HD this am and tolerated well.  Objective: Vital signs in last 24 hours: Temp:  [97.7 F (36.5 C)-98.2 F (36.8 C)] 97.7 F (36.5 C) (10/05 1600) Pulse Rate:  [81-98] 85 (10/05 1645) Cardiac Rhythm: Normal sinus rhythm (10/05 1150) Resp:  [9-36] 23 (10/05 1645) BP: (91-163)/(49-114) 121/67 (10/05 1630) SpO2:  [93 %-100 %] 100 % (10/05 1645) Weight:  [88.5 kg-89.8 kg] 88.5 kg (10/05 1154)  Hemodynamic parameters for last 24 hours:    Intake/Output from previous day: 10/04 0701 - 10/05 0700 In: 2274.4 [I.V.:10; NG/GT:2171; IV Piggyback:93.4] Out: 3 [Stool:3] Intake/Output this shift: Total I/O In: 423 [I.V.:3; NG/GT:420] Out: 61.3 [Emesis/NG output:60; Other:1.3]  General appearance: alert and cooperative Neurologic: intact Heart: regular rate and rhythm Lungs: clear to auscultation bilaterally Abdomen: soft, non-tender; bowel sounds normal; no masses,  no organomegaly Extremities: extremities normal, atraumatic, no cyanosis or edema Wound: chest wound is clean and granulating well, closing in. Chest tube sites are granulating and clean. Sacral wound is granulating for the most part with one area on left buttock that still has green fibrinous eschar.  Lab Results: Recent Labs    11/13/21 0311 11/14/21 0427  WBC 9.5 10.6*  HGB 9.0* 8.8*  HCT 31.0* 29.3*  PLT 418* 426*   BMET:  Recent Labs    11/13/21 0311 11/14/21 0427  NA 135 131*  K 4.4 4.6  CL 92* 91*  CO2 27 24  GLUCOSE 91 114*  BUN 58* 87*  CREATININE 3.81* 5.21*  CALCIUM 9.3 9.1    PT/INR: No results for input(s): "LABPROT", "INR" in the last 72 hours. ABG    Component Value Date/Time    PHART 7.301 (L) 10/22/2021 2354   HCO3 19.3 (L) 10/22/2021 2354   TCO2 20 (L) 10/22/2021 2354   ACIDBASEDEF 7.0 (H) 10/22/2021 2354   O2SAT 71.7 11/02/2021 0439   CBG (last 3)  Recent Labs    11/14/21 0713 11/14/21 1314 11/14/21 1632  GLUCAP 174* 134* 154*    Assessment/Plan: S/P Procedure(s) (LRB): REMOVAL OF IMPELLA LEFT VENTRICULAR ASSIST DEVICE (Right) TRANSESOPHAGEAL ECHOCARDIOGRAM (TEE) (N/A) PERCUTANEOUS TRACHEOSTOMY USING SHILEY FLEXIBLE 8 mm CUFFED TRACH. (N/A)  Overall he remains very stable.  Hemodynamically stable on midodrine. Tolerating HD well.  Respiratory status stable since trach removed.  Tolerating tube feeds at goal but have a lot of loose stool. IR consulted for possible PEG to allow longer term intermittent feeding which may help with loose stools.  Wounds continue to heal. Continue dressing changes.  PT/OT.    LOS: 47 days    [name] 11/14/2021

## 2021-11-14 NOTE — Procedures (Signed)
Patient seen on Hemodialysis. BP 108/62 (BP Location: Left Arm)   Pulse 84   Temp 98 F (36.7 C) (Oral)   Resp 20   Ht 5\' 7"  (1.702 m)   Wt 89.8 kg   SpO2 100%   BMI 31.01 kg/m   QB 400, UF goal 1.5L Tolerating treatment without complaints at this time.   Elmarie Shiley MD [facility]. Office # [phone] Pager # [phone] 10:26 AM

## 2021-11-14 NOTE — Progress Notes (Signed)
This chaplain responded to unit page for spiritual care.  The Pt. is awake and invited the chaplain to sit at the bedside.    The chaplain listened reflectively as the Pt. reflected on his admission and his desire to walk again. In the space of faith and healing, the chaplain understands the Pt. hopes to thank the medical team. The chaplain listened to the Pt. most recent story, he tells from a place of gratitude. The Pt. is open to F/U spiritual care and continuing to identify the miracles in his story.  The chaplain shared prayer with the Pt.  Chaplain Sallyanne Kuster [phone]

## 2021-11-14 NOTE — Progress Notes (Signed)
      [address]       [place] [address]             [phone]      No new issues, diarrhea persists  BP 121/67   Pulse 84   Temp 97.7 F (36.5 C) (Oral)   Resp (!) 36   Ht 5\' 7"  (1.702 m)   Wt 88.5 kg   SpO2 99%   BMI 30.56 kg/m    Intake/Output Summary (Last 24 hours) at 11/14/2021 1640 Last data filed at 11/14/2021 1600 Gross per 24 hour  Intake 1673 ml  Output 62.3 ml  Net 1610.7 ml    Remo Lipps C. Roxan Hockey, MD [facility] [phone]

## 2021-11-14 NOTE — Progress Notes (Signed)
PT Cancellation Note  Patient Details Name: [name] MRN: [id number] DOB: March 02, 1960   Cancelled Treatment:    Reason Eval/Treat Not Completed: Patient at procedure or test/unavailable (HD)   [name] [name] 11/14/2021, 7:52 AM [name], PT Acute [facility] Services Office: [phone]

## 2021-11-14 NOTE — Progress Notes (Signed)
SLP Cancellation Note  Patient Details Name: QUANTAE MARTEL MRN: [id number] DOB: April 20, 1960   Cancelled treatment:       Reason Eval/Treat Not Completed: Patient at procedure or test/unavailable (HD).     Osie Bond., M.A. [place] Office [phone]  Secure chat preferred  11/14/2021, 9:28 AM

## 2021-11-14 NOTE — Progress Notes (Signed)
11/14/21 0300  What Happened  Was fall witnessed? No  Was patient injured? No  Patient found on floor  Found by Staff-comment [facility])  Stated prior activity other (comment) (resting in bed)  Follow Up  MD notified Wilmon Pali  Time MD notified [phone]  Family notified No - patient refusal  Progress note created (see row info) Yes  Adult Fall Risk Assessment  Risk Factor Category (scoring not indicated) Fall has occurred during this admission (document High fall risk)  Age 61  Fall History: Fall within 6 months prior to admission 5  Elimination; Bowel and/or Urine Incontinence 2  Elimination; Bowel and/or Urine Urgency/Frequency 2  Medications: includes PCA/Opiates, Anti-convulsants, Anti-hypertensives, Diuretics, Hypnotics, Laxatives, Sedatives, and Psychotropics 0  Patient Care Equipment 1  Mobility-Assistance 2  Mobility-Gait 2  Mobility-Sensory Deficit 0  Altered awareness of immediate physical environment 0  Impulsiveness 2  Lack of understanding of one's physical/cognitive limitations 4  Total Score 21  Patient Fall Risk Level High fall risk  Adult Fall Risk Interventions  Required Bundle Interventions *See Row Information* High fall risk - low, moderate, and high requirements implemented  Additional Interventions Use of appropriate toileting equipment (bedpan, BSC, etc.);Room near nurses station;Pharmacy review of medications  Screening for Fall Injury Risk (To be completed on HIGH fall risk patients) - Assessing Need for Floor Mats  Risk For Fall Injury- Criteria for Floor Mats Previous fall this admission  Will Implement Floor Mats Yes  Vitals  Temp 98.2 F (36.8 C)  Temp Source Axillary  BP 110/89  MAP (mmHg) 96  BP Location Left Arm  BP Method Automatic  Patient Position (if appropriate) Lying  Pulse Rate 90  Pulse Rate Source Monitor  ECG Heart Rate 91  Cardiac Rhythm NSR  Resp (!) 33  Oxygen Therapy  SpO2 98 %  O2 Device Nasal Cannula  O2 Flow Rate (L/min) 2  L/min  Pain Assessment  Pain Scale 0-10  Pain Score 0  Neurological  Neuro (WDL) X  Level of Consciousness Alert  Orientation Level Oriented to person;Oriented to situation;Disoriented to place;Disoriented to time  [employer] commands;Impulsive;Poor safety awareness;Poor judgement  Speech Clear  R Pupil Size (mm) 3  R Pupil Shape Round  R Pupil Reaction Brisk  L Pupil Size (mm) 3  L Pupil Shape Round  L Pupil Reaction Brisk  R Hand Grip Moderate  L Hand Grip Moderate  R Foot Dorsiflexion Moderate  L Foot Dorsiflexion Moderate  R Foot Plantar Flexion Moderate  L Foot Plantar Flexion Moderate  RUE Motor Response Purposeful movement  RUE Motor Strength 4  LUE Motor Response Purposeful movement  LUE Motor Strength 4  RLE Motor Response Purposeful movement  RLE Motor Strength 4  LLE Motor Response Purposeful movement  LLE Motor Strength 4  Neuro Symptoms None  Glasgow Coma Scale  Eye Opening 4  Best Verbal Response (NON-intubated) 4  Best Motor Response 6  Glasgow Coma Scale Score 14  Musculoskeletal  Musculoskeletal (WDL) X  Assistive Device Stedy  Generalized Weakness Yes  Weight Bearing Restrictions No  Musculoskeletal Details  RUE Limited movement;Weakness  LUE Limited movement;Weakness  RLE Limited movement;Weakness  LLE Limited movement;Weakness  Right Foot Limited movement;Weakness  Left Foot Limited movement;Weakness  Integumentary  Integumentary (WDL) X  Skin Color Appropriate for ethnicity  Skin Condition Dry;Flaky  Skin Integrity Abrasion;Ecchymosis  Abrasion Location Arm;Knee  Abrasion Location Orientation Right  Abrasion Intervention Other (Comment) (assessed)  Ecchymosis Location Abdomen;Arm  Ecchymosis Location Orientation Bilateral  Skin Turgor Non-tenting

## 2021-11-14 NOTE — Progress Notes (Signed)
Received patient in bed to unit.  Alert and oriented.  Informed consent signed and in chart.   Treatment initiated: 0815 Treatment completed: 1140  Patient tolerated well.  Transported back to the room  Alert, without acute distress.  Hand-off given to patient's nurse.   Access used: HD cath Access issues: NA  Total UF removed: 1300 Medication(s) given: Heparin 3500 units bolus at beginning of tx, Heparin dwells 3800 units Post HD VS: 97.8    102/81    89    19    100% 5L [place] Post HD weight: 88.3kg   [facility]

## 2021-11-14 NOTE — Plan of Care (Signed)
Notified re pt fall, no head strike. Probably sliped from the bed.

## 2021-11-15 ENCOUNTER — Inpatient Hospital Stay (HOSPITAL_COMMUNITY): Payer: Self-pay

## 2021-11-15 HISTORY — PX: IR GASTROSTOMY TUBE MOD SED: IMG625

## 2021-11-15 LAB — GLUCOSE, CAPILLARY
Glucose-Capillary: 119 mg/dL — ABNORMAL HIGH (ref 70–99)
Glucose-Capillary: 133 mg/dL — ABNORMAL HIGH (ref 70–99)
Glucose-Capillary: 82 mg/dL (ref 70–99)
Glucose-Capillary: 88 mg/dL (ref 70–99)
Glucose-Capillary: 90 mg/dL (ref 70–99)
Glucose-Capillary: 96 mg/dL (ref 70–99)

## 2021-11-15 LAB — CBC
HCT: 30.3 % — ABNORMAL LOW (ref 39.0–52.0)
Hemoglobin: 9.2 g/dL — ABNORMAL LOW (ref 13.0–17.0)
MCH: 28.9 pg (ref 26.0–34.0)
MCHC: 30.4 g/dL (ref 30.0–36.0)
MCV: 95.3 fL (ref 80.0–100.0)
Platelets: 425 10*3/uL — ABNORMAL HIGH (ref 150–400)
RBC: 3.18 MIL/uL — ABNORMAL LOW (ref 4.22–5.81)
RDW: 17.7 % — ABNORMAL HIGH (ref 11.5–15.5)
WBC: 9.5 10*3/uL (ref 4.0–10.5)
nRBC: 0.5 % — ABNORMAL HIGH (ref 0.0–0.2)

## 2021-11-15 LAB — RENAL FUNCTION PANEL
Albumin: 2.9 g/dL — ABNORMAL LOW (ref 3.5–5.0)
Anion gap: 18 — ABNORMAL HIGH (ref 5–15)
BUN: 72 mg/dL — ABNORMAL HIGH (ref 8–23)
CO2: 23 mmol/L (ref 22–32)
Calcium: 9.2 mg/dL (ref 8.9–10.3)
Chloride: 92 mmol/L — ABNORMAL LOW (ref 98–111)
Creatinine, Ser: 4.09 mg/dL — ABNORMAL HIGH (ref 0.61–1.24)
GFR, Estimated: 16 mL/min — ABNORMAL LOW (ref >60)
Glucose, Bld: 81 mg/dL (ref 70–99)
Phosphorus: 3.2 mg/dL (ref 2.5–4.6)
Potassium: 4.6 mmol/L (ref 3.5–5.1)
Sodium: 133 mmol/L — ABNORMAL LOW (ref 135–145)

## 2021-11-15 LAB — MAGNESIUM: Magnesium: 1.7 mg/dL (ref 1.7–2.4)

## 2021-11-15 MED ORDER — FENTANYL CITRATE (PF) 100 MCG/2ML IJ SOLN
INTRAMUSCULAR | Status: AC | PRN
  Administered 2021-11-15: 50 ug via INTRAVENOUS

## 2021-11-15 MED ORDER — LIDOCAINE HCL 1 % IJ SOLN
INTRAMUSCULAR | Status: AC
  Filled 2021-11-15: qty 20

## 2021-11-15 MED ORDER — MIDAZOLAM HCL 2 MG/2ML IJ SOLN
INTRAMUSCULAR | Status: AC
  Filled 2021-11-15: qty 2

## 2021-11-15 MED ORDER — IOHEXOL 300 MG/ML  SOLN: 100.0000 mL | Freq: Once | INTRAMUSCULAR | Status: DC | PRN

## 2021-11-15 MED ORDER — FENTANYL CITRATE (PF) 100 MCG/2ML IJ SOLN
INTRAMUSCULAR | Status: AC
  Filled 2021-11-15: qty 2

## 2021-11-15 MED ORDER — MIDAZOLAM HCL 2 MG/2ML IJ SOLN
INTRAMUSCULAR | Status: AC | PRN
  Administered 2021-11-15: 1 mg via INTRAVENOUS

## 2021-11-15 NOTE — Progress Notes (Signed)
Patient ID: [name], male   DOB: 01-Mar-1960, 61 y.o.   MRN: [id number] [facility] Progress Note   Assessment/ Plan:   1.  End-stage renal disease: Anuric and with prolonged dialysis dependent acute kidney injury.  Etiology thought to be ischemic ATN in the setting of cardiogenic shock/cardiac arrest.  Started on CRRT 10/07/2021 and has recently been transitioned to intermittent hemodialysis via left IJ TDC.  Process started for outpatient dialysis unit placement and will continue HD here on a TTS schedule-next dialysis tomorrow in [place]. 2.  History of cardiogenic shock/cardiac arrest: With previous cardiac tamponade/hemorrhagic pericarditis status post pericardial drain placement and requirement of ECMO.  Underwent mediastinal washout with sternal closure on 9/2 and continues to make gradual improvement. 3.  Anemia of critical illness with acute blood loss anemia: S/P IV Fe and on weekly aranesp. 4.  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 Nutrition: Awaiting PEG tube placement today  Subjective:   Slept poorly overnight due to diarrhea   Objective:   BP 99/85   Pulse 89   Temp (!) 97.5 F (36.4 C) (Oral)   Resp 15   Ht 5\' 7"  (1.702 m)   Wt 88.9 kg   SpO2 100%   BMI 30.70 kg/m   Intake/Output Summary (Last 24 hours) at 11/15/2021 0751 Last data filed at 11/15/2021 0400 Gross per 24 hour  Intake 1193 ml  Output 62.3 ml  Net 1130.7 ml   Weight change:   Physical Exam: Gen: Appears comfortable sitting on BSC CVS: Pulse regular rhythm, normal rate, S1 and S2 sounds distant Resp: Clear to auscultation bilaterally, no distinct rales or rhonchi Abd: Firm, protuberant, bowel sounds normal Ext: Trace lower extremity edema  Imaging: DG Abd 1 View  Result Date: 11/14/2021 CLINICAL DATA:  Check gastric catheter placement EXAM: ABDOMEN - 1 VIEW COMPARISON:  10/31/2021 FINDINGS: Feeding catheter is noted extending into the second portion of the  duodenum. No obstructive changes are seen. IMPRESSION: Feeding catheter in the second portion of the duodenum. Electronically Signed   By: Inez Catalina M.D.   On: 11/14/2021 03:11    Labs: BMET Recent Labs  Lab 11/09/21 0606 11/10/21 0829 11/11/21 0553 11/12/21 0315 11/13/21 0311 11/14/21 0427  NA 138 137 136 135 135 131*  K 4.0 4.0 4.2 4.8 4.4 4.6  CL 96* 94* 94* 94* 92* 91*  CO2 26 27 24 24 27 24   GLUCOSE 148* 112* 124* 134* 91 114*  BUN 85* 51* 80* 103* 58* 87*  CREATININE 4.83* 3.44* 4.75* 5.77* 3.81* 5.21*  CALCIUM 9.3 9.4 9.3 9.5 9.3 9.1  PHOS 3.2 3.1 2.8 2.8 3.6 4.2   CBC Recent Labs  Lab 11/12/21 0315 11/13/21 0311 11/14/21 0427 11/15/21 0623  WBC 9.1 9.5 10.6* 9.5  HGB 8.3* 9.0* 8.8* 9.2*  HCT 28.3* 31.0* 29.3* 30.3*  MCV 99.0 97.8 95.1 95.3  PLT 432* 418* 426* 425*    Medications:     sodium chloride   Intravenous Once   ascorbic acid  250 mg Per Tube BID   aspirin  81 mg Per Tube Daily   Chlorhexidine Gluconate Cloth  6 each Topical Daily   darbepoetin (ARANESP) injection - DIALYSIS  100 mcg Intravenous Q Tue-HD   feeding supplement (PROSource TF20)  60 mL Per Tube TID   feeding supplement (VITAL 1.5 CAL)  1,000 mL Per Tube Q24H   fentaNYL  1 patch Transdermal Q72H   fiber supplement (BANATROL TF)  60 mL Per Tube QID   folic acid  1 mg Per Tube Daily   Gerhardt's butt cream   Topical Daily   heparin injection (subcutaneous)  5,000 Units Subcutaneous Q8H   insulin aspart  0-20 Units Subcutaneous Q4H   insulin aspart  2 Units Subcutaneous Q4H   insulin glargine-yfgn  10 Units Subcutaneous QHS   midodrine  20 mg Per Tube TID WC   multivitamin  1 tablet Per Tube QHS   nutrition supplement (JUVEN)  1 packet Per Tube BID BM   mouth rinse  15 mL Mouth Rinse 4 times per day   pantoprazole  40 mg Per Tube BID   QUEtiapine  50 mg Per Tube QHS   sodium chloride flush  3 mL Intravenous Q12H   sodium hypochlorite   Topical BID   thiamine  100 mg Per Tube  Daily   Elmarie Shiley, MD 11/15/2021, 7:51 AM

## 2021-11-15 NOTE — Progress Notes (Signed)
SLP Cancellation Note  Patient Details Name: [name] MRN: [id number] DOB: [birth year]   Cancelled treatment:       Reason Eval/Treat Not Completed: Medical issues which prohibited therapy. Pt NPO for planned PEG tube. Will f/u as able.     Osie Bond., M.A. [place] Office [phone]  Secure chat preferred  11/15/2021, 11:10 AM

## 2021-11-15 NOTE — Progress Notes (Signed)
Physical Therapy Treatment Patient Details Name: [name] MRN: [id number] DOB: 02/02/1961 Today's Date: 11/15/2021   History of Present Illness Pt is a 61 y.o. male admitted 09/28/21 with SOB, bradycardia, hypotension. Workup for bilateral pleural effusion, pericardial effusion s/p VATS with pericardial window 8/19. Cardiac arrest with tamponade 8/20, intubated with bedside mediastinal exploration, ECMO via fem access. Pt with mediastinal hemorrhage s/p reexploration 8/21. To OR 8/28 for washout, ECMO decannulation, Impella placement. CRRT initiated 8/28. Brief PEA arrest 8/29 with DCCV to NSR.  S/p chest closure 9/2. S/p trach and impella removed 9/7. S/p wound vac removal 9/11. Course complicated by bouts of hypotension and bradycardia. Off CRRT 9/19. Trach decannulated 9/25. Tunned HD cath placement 9/25; iHD initiated. PMH includes CAD (s/p recent CABG 09/12/21), CKD2, DM, HTN, AKI, afib, CVA.    PT Comments    Pt with significant progression this session able to transition to standing and pivot to [facility] this session. Pt assisted with propelling WC with his feet and able to get out to patio for exercises with OT. Pt educated for sternal precautions and progressive mobility. Plan remains appropriate with goals updated.   HR 85 96% on RA    Recommendations for follow up therapy are one component of a multi-disciplinary discharge planning process, led by the attending physician.  Recommendations may be updated based on patient status, additional functional criteria and insurance authorization.  Follow Up Recommendations  Skilled nursing-short term rehab (<3 hours/day) Can patient physically be transported by private vehicle: No   Assistance Recommended at Discharge Frequent or constant Supervision/Assistance  Patient can return home with the following A lot of help with bathing/dressing/bathroom;A lot of help with walking and/or transfers;Direct supervision/assist for financial management;Help  with stairs or ramp for entrance;Direct supervision/assist for medications management;Assistance with cooking/housework;Assist for transportation   Equipment Recommendations  Wheelchair (measurements PT);Wheelchair cushion (measurements PT);Hospital bed    Recommendations for Other Services       Precautions / Restrictions Precautions Precautions: Sternal;Fall;Other (comment) Precaution Comments: cortrak, painful sacral/buttocks wound Restrictions Weight Bearing Restrictions: No Other Position/Activity Restrictions: initial CABG 09/12/21 with closure 9/2     Mobility  Bed Mobility Overal bed mobility: Needs Assistance Bed Mobility: Rolling, Sidelying to Sit, Sit to Sidelying Rolling: Mod assist Sidelying to sit: Max assist   Sit to supine: Max assist   General bed mobility comments: pt sidelying on arrival with max assist to lift trunk from surface and to clear legs. Return to bed with assist to control trunk and lift LLE, cues for sequence    Transfers Overall transfer level: Needs assistance   Transfers: Sit to/from Stand Sit to Stand: Mod assist, +2 safety/equipment Stand pivot transfers: Mod assist         General transfer comment: mod assist to rise from bed, WC, BSC with pt able to pivot bed>WC<>BSC then transferred to patio with return WC>bed    Ambulation/Gait                   Theme park manager mobility: Yes Wheelchair propulsion: Both lower extermities Wheelchair parts: Needs assistance Distance: 150 Wheelchair Assistance Details (indicate cue type and reason): total assist for all management, of parts, mod assist to propel WC and max assist to turn  Modified Rankin (Stroke Patients Only)       Balance Overall balance assessment: Needs assistance Sitting-balance support: Feet supported, No upper extremity supported Sitting balance-Leahy  Scale: Fair Sitting balance - Comments:  minguard EOB   Standing balance support: Bilateral upper extremity supported Standing balance-Leahy Scale: Poor Standing balance comment: bil UE support on therapist arms to stand                            Cognition Arousal/Alertness: Awake/alert Behavior During Therapy: WFL for tasks assessed/performed Overall Cognitive Status: Impaired/Different from baseline Area of Impairment: Memory, Orientation                 Orientation Level: Disoriented to, Time Current Attention Level: Selective Memory: Decreased recall of precautions Following Commands: Follows one step commands consistently     Problem Solving: Requires verbal cues General Comments: pt with significant improvement in cognition with continued confusion related to day        Exercises      General Comments        Pertinent Vitals/Pain Pain Assessment Pain Score: 5  Pain Location: sacrum Pain Descriptors / Indicators: Sore    Home Living                          Prior Function            PT Goals (current goals can now be found in the care plan section) Acute Rehab PT Goals PT Goal Formulation: With patient Time For Goal Achievement: 11/29/21 Potential to Achieve Goals: Good Progress towards PT goals: Goals met and updated - see care plan    Frequency    Min 3X/week      PT Plan Current plan remains appropriate    Co-evaluation              AM-PAC PT "6 Clicks" Mobility   Outcome Measure  Help needed turning from your back to your side while in a flat bed without using bedrails?: A Lot Help needed moving from lying on your back to sitting on the side of a flat bed without using bedrails?: Total Help needed moving to and from a bed to a chair (including a wheelchair)?: A Lot Help needed standing up from a chair using your arms (e.g., wheelchair or bedside chair)?: A Lot Help needed to walk in hospital room?: Total Help needed climbing 3-5 steps with a  railing? : Total 6 Click Score: 9    End of Session Equipment Utilized During Treatment: Gait belt Activity Tolerance: Patient tolerated treatment well Patient left: in bed;with call bell/phone within reach;with bed alarm set Nurse Communication: Mobility status;Precautions PT Visit Diagnosis: Other abnormalities of gait and mobility (R26.89);Difficulty in walking, not elsewhere classified (R26.2);Muscle weakness (generalized) (M62.81)     Time: 8891-6945 PT Time Calculation (min) (ACUTE ONLY): 43 min  Charges:  $Therapeutic Activity: 23-37 mins                     [place], PT Acute Rehabilitation Services Office: [phone].    Caili Escalera B Maizey Menendez 11/15/2021, 10:55 AM

## 2021-11-15 NOTE — Progress Notes (Signed)
Advised by [facility] admissions that pt's referral is pending financial clearance. Pt is uninsured therefore unable to proceed with clinic approval until financial clearance is obtained. Update provided to nephrologist.   Melven Sartorius Renal Navigator [phone]

## 2021-11-15 NOTE — Progress Notes (Signed)
Occupational Therapy Treatment Patient Details Name: [name] MRN: [id number] DOB: Jun 12, 1960 Today's Date: 11/15/2021   History of present illness Pt is a 61 y.o. male admitted 09/28/21 with SOB, bradycardia, hypotension. Workup for bilateral pleural effusion, pericardial effusion s/p VATS with pericardial window 8/19. Cardiac arrest with tamponade 8/20, intubated with bedside mediastinal exploration, ECMO via fem access. Pt with mediastinal hemorrhage s/p reexploration 8/21. To OR 8/28 for washout, ECMO decannulation, Impella placement. CRRT initiated 8/28. Brief PEA arrest 8/29 with DCCV to NSR.  S/p chest closure 9/2. S/p trach and impella removed 9/7. S/p wound vac removal 9/11. Course complicated by bouts of hypotension and bradycardia. Off CRRT 9/19. Trach decannulated 9/25. Tunned HD cath placement 9/25; iHD initiated. PMH includes CAD (s/p recent CABG 09/12/21), CKD2, DM, HTN, AKI, afib, CVA.   OT comments  Making excellent progress. Mod A +2 for safety with stand pivot transfer to [facility] with VC to adhere to sternal precautions. Helping to self propel wc with feet with PT/OT to push himself outside to complete BUE strengthening. Improved attention and ability to participate with mobility and ADL tasks as noted below. Will most likley fabricate intrinsic plus position hand splint next week for night use for R hand. Pt will benefit from extensive post acute therapy. VSS during session. Will continue to follow.    Recommendations for follow up therapy are one component of a multi-disciplinary discharge planning process, led by the attending physician.  Recommendations may be updated based on patient status, additional functional criteria and insurance authorization.    Follow Up Recommendations  Skilled nursing-short term rehab (<3 hours/day)    Assistance Recommended at Discharge Frequent or constant Supervision/Assistance  Patient can return home with the following  A lot of help with walking  and/or transfers;A lot of help with bathing/dressing/bathroom;Assistance with feeding;Assistance with cooking/housework;Direct supervision/assist for medications management;Direct supervision/assist for financial management;Assist for transportation;Help with stairs or ramp for entrance   Equipment Recommendations  BSC/3in1    Recommendations for Other Services      Precautions / Restrictions Precautions Precautions: Sternal;Fall;Other (comment) Precaution Booklet Issued: No Precaution Comments: cortrak, painful sacral/buttocks wound Restrictions Weight Bearing Restrictions: No Other Position/Activity Restrictions: initial CABG 09/12/21 with closure 9/2       Mobility Bed Mobility Overal bed mobility: Needs Assistance Bed Mobility: Rolling, Sidelying to Sit, Sit to Sidelying Rolling: Mod assist Sidelying to sit: Max assist   Sit to supine: Max assist   General bed mobility comments: pt sidelying on arrival with max assist to lift trunk from surface and to clear legs. Return to bed with assist to control trunk and lift LLE, cues for sequence    Transfers Overall transfer level: Needs assistance   Transfers: Sit to/from Stand Sit to Stand: Mod assist, +2 safety/equipment Stand pivot transfers: Mod assist         General transfer comment: mod assist to rise from bed, WC, BSC with pt able to pivot bed>WC<>BSC then transferred to patio with return WC>bed     Balance     Sitting balance-Leahy Scale: Fair       Standing balance-Leahy Scale: Poor                             ADL either performed or assessed with clinical judgement   ADL Overall ADL's : Needs assistance/impaired     Grooming: Minimal assistance   Upper Body Bathing: Moderate assistance   Lower Body Bathing: Maximal  assistance   Upper Body Dressing : Maximal assistance   Lower Body Dressing: Maximal assistance Lower Body Dressing Details (indicate cue type and reason): working on  bending over toward socks; states donning socks was difficult for him to complete prior to getting sick Toilet Transfer: Moderate assistance;+2 for safety/equipment;Stand-pivot   Toileting- Clothing Manipulation and Hygiene: Maximal assistance Toileting - Clothing Manipulation Details (indicate cue type and reason): unable to complete pericare     Functional mobility during ADLs: Moderate assistance;+2 for safety/equipment (stand pivot; +2 for ambulation) General ADL Comments: educaiton on sternal precautions for ADL tasks however requires mod vc to adhere    Extremity/Trunk Assessment Upper Extremity Assessment Upper Extremity Assessment: RUE deficits/detail RUE Deficits / Details: minimal digit flexion; pronation 3/5; supination 4/5; digit extension 2/5; thumb adduciton 0/5; thumb extension 4/5; genralzied weakness in shoulder and shoulder girdle/scapula LUE Deficits / Details: generalized weakness; weaker proximally; able to achieve @ 90 degree FF and ABd; limited ER ; unabl eot reach back of head   Lower Extremity Assessment Lower Extremity Assessment: Defer to PT evaluation        Vision       Perception     Praxis      Cognition Arousal/Alertness: Awake/alert Behavior During Therapy: WFL for tasks assessed/performed, Flat affect (howver more engaging) Overall Cognitive Status: Impaired/Different from baseline Area of Impairment: Memory, Orientation, Attention, Safety/judgement, Awareness, Problem solving                 Orientation Level: Disoriented to, Time Current Attention Level: Selective Memory: Decreased recall of precautions, Decreased short-term memory Following Commands: Follows one step commands consistently Safety/Judgement: Decreased awareness of safety, Decreased awareness of deficits (has fallen out of bed x 2) Awareness: Emergent Problem Solving: Requires verbal cues, Slow processing General Comments: pt with significant improvement in  cognition        Exercises General Exercises - Upper Extremity Shoulder Flexion: AAROM, Both, 10 reps, Standing, AROM Shoulder ABduction: AROM, AAROM, Right, 10 reps Elbow Flexion: AAROM, Both, Sidelying Elbow Extension: AAROM, Both, Sidelying Wrist Flexion: AROM, Both, Sidelying Wrist Extension: AAROM, Both, Sidelying Digit Composite Flexion: Right, PROM, AAROM, 10 reps Composite Extension: PROM, AAROM, Right Hand Exercises Forearm Supination: AROM, Right, 5 reps Forearm Pronation: PROM, AAROM, Strengthening, 5 reps Wrist Ulnar Deviation: AAROM, Right, 15 reps Wrist Radial Deviation: AAROM, Right, 15 reps Thumb Abduction: Right, PROM, 15 reps Thumb Adduction: PROM, Right, 15 reps Opposition: PROM, Right, 10 reps Other Exercises Other Exercises: scapular elevation/depression    Shoulder Instructions       General Comments      Pertinent Vitals/ Pain       Pain Assessment Pain Assessment: Faces Faces Pain Scale: Hurts even more Pain Location: sacrum Pain Descriptors / Indicators: Sore Pain Intervention(s): Limited activity within patient's tolerance  Home Living                                          Prior Functioning/Environment              Frequency  Min 2X/week        Progress Toward Goals  OT Goals(current goals can now be found in the care plan section)  Progress towards OT goals: Progressing toward goals  Acute Rehab OT Goals Patient Stated Goal: to walk OT Goal Formulation: With patient Time For Goal Achievement: 11/18/21 Potential to Achieve Goals: Good  ADL Goals Pt Will Perform Grooming: with mod assist;bed level Pt Will Perform Upper Body Bathing: with mod assist;bed level Pt/caregiver will Perform Home Exercise Program: Increased strength;Increased ROM;Both right and left upper extremity;With minimal assist Additional ADL Goal #1: Pt will demosntrate sustained attention for grooming task in nondistracting  environment Additional ADL Goal #2: Pt will complete bed mobility with mod A in preparation for ADL tasks Additional ADL Goal #3: Pt will grasp/release objects using functional pinch pattern with necessary AE/ splinting to increase functional use R hand  Plan Discharge plan remains appropriate    Co-evaluation    PT/OT/SLP Co-Evaluation/Treatment: Yes Reason for Co-Treatment: Complexity of the patient's impairments (multi-system involvement);For patient/therapist safety;To address functional/ADL transfers   OT goals addressed during session: ADL's and self-care;Strengthening/ROM      AM-PAC OT "6 Clicks" Daily Activity     Outcome Measure   Help from another person eating meals?: Total Help from another person taking care of personal grooming?: A Little Help from another person toileting, which includes using toliet, bedpan, or urinal?: A Lot Help from another person bathing (including washing, rinsing, drying)?: A Lot Help from another person to put on and taking off regular upper body clothing?: A Lot Help from another person to put on and taking off regular lower body clothing?: A Lot 6 Click Score: 12    End of Session Equipment Utilized During Treatment: Gait belt  OT Visit Diagnosis: Unsteadiness on feet (R26.81);Other abnormalities of gait and mobility (R26.89);Muscle weakness (generalized) (M62.81);Other symptoms and signs involving cognitive function;Pain Pain - part of body: Hand (sacrum)   Activity Tolerance Patient limited by fatigue   Patient Left in bed;with call bell/phone within reach;with nursing/sitter in room;Other (comment)   Nurse Communication Mobility status        Time: 1010-1052 OT Time Calculation (min): 42 min  Charges: OT General Charges $OT Visit: 1 Visit OT Treatments $Self Care/Home Management : 8-22 mins  Maurie Boettcher, OT/L   Acute OT Clinical Specialist [place] Pager [phone] Office [phone]    [facility] 11/15/2021, 11:20 AM

## 2021-11-15 NOTE — Progress Notes (Signed)
29 Days Post-Op Procedure(s) (LRB): REMOVAL OF IMPELLA LEFT VENTRICULAR ASSIST DEVICE (Right) TRANSESOPHAGEAL ECHOCARDIOGRAM (TEE) (N/A) PERCUTANEOUS TRACHEOSTOMY USING SHILEY FLEXIBLE 8 mm CUFFED TRACH. (N/A) Subjective:  Didn't get much sleep due to frequent loose stools overnight. Nurse says about 15. Has not received any imodium since MN due to NPO status for PEG today although tube feeds stopped too.  Objective: Vital signs in last 24 hours: Temp:  [97.5 F (36.4 C)-98.6 F (37 C)] 97.5 F (36.4 C) (10/06 0421) Pulse Rate:  [81-98] 89 (10/06 0530) Cardiac Rhythm: Normal sinus rhythm (10/06 0400) Resp:  [9-36] 15 (10/06 0330) BP: (65-163)/(43-114) 99/85 (10/06 0500) SpO2:  [93 %-100 %] 100 % (10/06 0530) Weight:  [88.5 kg-89.8 kg] 88.9 kg (10/06 0500)  Hemodynamic parameters for last 24 hours:    Intake/Output from previous day: 10/05 0701 - 10/06 0700 In: 7622 [I.V.:3; NG/GT:840] Out: 62.3 [Emesis/NG output:60; Stool:1] Intake/Output this shift: No intake/output data recorded.  General appearance: alert and cooperative Neurologic: intact Heart: regular rate and rhythm Lungs: clear to auscultation bilaterally Abdomen: soft, non-tender; bowel sounds normal Extremities: no edema Wound: dressings intact  Lab Results: Recent Labs    11/14/21 0427 11/15/21 0623  WBC 10.6* 9.5  HGB 8.8* 9.2*  HCT 29.3* 30.3*  PLT 426* 425*   BMET:  Recent Labs    11/13/21 0311 11/14/21 0427  NA 135 131*  K 4.4 4.6  CL 92* 91*  CO2 27 24  GLUCOSE 91 114*  BUN 58* 87*  CREATININE 3.81* 5.21*  CALCIUM 9.3 9.1    PT/INR:  Recent Labs    11/14/21 1659  LABPROT 13.6  INR 1.1   ABG    Component Value Date/Time   PHART 7.301 (L) 10/22/2021 2354   HCO3 19.3 (L) 10/22/2021 2354   TCO2 20 (L) 10/22/2021 2354   ACIDBASEDEF 7.0 (H) 10/22/2021 2354   O2SAT 71.7 11/02/2021 0439   CBG (last 3)  Recent Labs    11/14/21 2026 11/15/21 0010 11/15/21 0419  GLUCAP 109*  133* 88    Assessment/Plan: S/P Procedure(s) (LRB): REMOVAL OF IMPELLA LEFT VENTRICULAR ASSIST DEVICE (Right) TRANSESOPHAGEAL ECHOCARDIOGRAM (TEE) (N/A) PERCUTANEOUS TRACHEOSTOMY USING SHILEY FLEXIBLE 8 mm CUFFED TRACH. (N/A)  Hemodynamically stable in sinus rhythm.  Respiratory status stable.  Had HD yesterday and will have again tomorrow.  PEG tube planned per IR today.   LOS: 48 days    [name] 11/15/2021

## 2021-11-15 NOTE — Progress Notes (Signed)
This chaplain is present for F/U spiritual care. The chaplain sat down and reintroduced herself to the Pt. with a recap of the previous visit.    The chaplain attempted to connect with the Pt. Progress in PT today based on the Pt. hope to walk.  The Pt. shared "stand and pivot" is not a change for him.   Because of the short visit, the Pt. accepted the chaplain's invitation to talk about an Advance Directive in the near future.  The chaplain stepped away from the bedside with the Pt. acceptance of visiting again next week. The chaplain updated the Pt. RN on the visit. The chaplain shared her observations of the Pt. short term memory and processing of time.  Chaplain Sallyanne Kuster [phone]

## 2021-11-15 NOTE — Procedures (Addendum)
Interventional Radiology Procedure Note  Procedure:   Patient in [place] for attempt at image guided percutaneous gastrostomy.   Patient is withdrawing consent during the procedure, upon attempt at [facility] catheter.   Discontinued procedure without performing  Complications: None  Recommendations:  - Discussed with Dr. Cyndia Bent.  We will plan on revisiting next week with anesthesia, for MAC vs GETA attempt.   - We will follow up     Signed,  Dulcy Fanny. Earleen Newport, DO

## 2021-11-15 NOTE — Sedation Documentation (Signed)
When attempting insertion of OG pt became very upset.  After talking with Dr. Earleen Newport patient decide to stop procedure.  Dr Aleen Campi will call attending.  Procedure aborted.

## 2021-11-15 NOTE — Consult Note (Signed)
Chief Complaint: Patient was seen in consultation today for gastrostomy tube placement at the request of Gilford Raid, MD  Supervising Physician: Markus Daft  Patient Status: [facility] - In-pt  History of Present Illness: [name] is a 61 y.o. male with extended admission after cardiac arrest which has included multiple surgeries, blood products, tracheostomy, sacral wound and dialysis.  He has been dependent on NG tube for nutrition during this time and is not clear for a PO diet.  IR consulted for placement of percutaneous gastrostomy tube.     Past Medical History:  Diagnosis Date   AKI (acute kidney injury) ([place])    pt unaware of this   Anginal pain ([place])    CAD (coronary artery disease)    a. 03/2015 NSTEMI: LHC with severe 3V CAD  (70% mid RCA, 95% OM1, 90% distal LCx, 90% OM3, 80% prox LAD and 90% ost D1) s/p DES to mLAD w/ small dissction Rx with DES, staged ost Ramus PCI/DES and dLCx s/p PCI/DES    Chest pain 12/24/2020   Diabetes mellitus type 2 in obese [facility])    Diverticulosis    Dyspnea    Dyspnea on exertion 03/16/2015   Dyspnea on exertion   Family history of adverse reaction to anesthesia    patient father- pt states after anesthesia his father "developed dementia"   GERD (gastroesophageal reflux disease)    Hypercholesteremia    Hypertension associated with diabetes ([place]) 03/16/2015   hypertension   NSTEMI (non-ST elevated myocardial infarction) ([place]) 03/17/2015   Obesity    Stroke ([place]) 2022   pt states he had a "mini stroke" during cardiac catheterization   Tobacco abuse     Past Surgical History:  Procedure Laterality Date   APPLICATION OF WOUND VAC N/A 10/03/2021   Procedure: APPLICATION OF WOUND VAC;  Surgeon: Dahlia Byes, MD;  Location: [place];  Service: Thoracic;  Laterality: N/A;   CANNULATION FOR ECMO (EXTRACORPOREAL MEMBRANE OXYGENATION) N/A 09/29/2021   Procedure: CANNULATION FOR ECMO (EXTRACORPOREAL MEMBRANE OXYGENATION);  Surgeon: Lajuana Matte, MD;  Location: [place];  Service: Open Heart Surgery;  Laterality: N/A;   CARDIAC CATHETERIZATION N/A 03/19/2015   Procedure: Left Heart Cath and Coronary Angiography;  Surgeon: Lorretta Harp, MD;  Location: [place] CV LAB;  Service: Cardiovascular;  Laterality: N/A;   CARDIAC CATHETERIZATION N/A 03/20/2015   Procedure: Coronary Stent Intervention;  Surgeon: Lorretta Harp, MD;  Location: [place] CV LAB;  Service: Cardiovascular;  Laterality: N/A;   CARDIAC CATHETERIZATION N/A 03/22/2015   Procedure: Coronary Stent Intervention;  Surgeon: Lorretta Harp, MD;  Location: [place] CV LAB;  Service: Cardiovascular;  Laterality: N/A;   CORONARY ARTERY BYPASS GRAFT N/A 09/12/2021   Procedure: CORONARY ARTERY BYPASS GRAFTING (CABG) X 5 USING LEFT INTERNAL MAMMARY ARTERY AND ENDOSCOPICALLY HARVESTED RIGHT GREATER SAPHENOUS VEIN.;  Surgeon: [name], MD;  Location: [place];  Service: Open Heart Surgery;  Laterality: N/A;   EXPLORATION POST OPERATIVE OPEN HEART N/A 09/30/2021   Procedure: EXPLORATION POST OPERATIVE OPEN HEART WASHOUT;  Surgeon: Lajuana Matte, MD;  Location: [place];  Service: Open Heart Surgery;  Laterality: N/A;   IR FLUORO GUIDE CV LINE LEFT  11/04/2021   IR US GUIDE VASC ACCESS LEFT  11/04/2021   LEFT HEART CATH AND CORONARY ANGIOGRAPHY N/A 12/25/2020   Procedure: LEFT HEART CATH AND CORONARY ANGIOGRAPHY;  Surgeon: Troy Sine, MD;  Location: [place] CV LAB;  Service: Cardiovascular;  Laterality:  N/A;   LEFT HEART CATH AND CORONARY ANGIOGRAPHY N/A 12/26/2020   Procedure: LEFT HEART CATH AND CORONARY ANGIOGRAPHY;  Surgeon: Troy Sine, MD;  Location: [place] CV LAB;  Service: Cardiovascular;  Laterality: N/A;   MEDIASTINAL EXPLORATION N/A 10/03/2021   Procedure: MEDIASTINAL WASHOUT;  Surgeon: Dahlia Byes, MD;  Location: [place];  Service: Thoracic;  Laterality: N/A;  PUMP STANDBY   MEDIASTINAL EXPLORATION N/A 10/12/2021   Procedure:  MEDIASTINAL WASHOUT;  Surgeon: [name], MD;  Location: [place];  Service: Thoracic;  Laterality: N/A;   PERCUTANEOUS TRACHEOSTOMY N/A 10/17/2021   Procedure: PERCUTANEOUS TRACHEOSTOMY USING SHILEY FLEXIBLE 8 mm CUFFED [place].;  Surgeon: Candee Furbish, MD;  Location: [place];  Service: Pulmonary;  Laterality: N/A;  Percutaneous tracheostomy   PERICARDIAL WINDOW Right 09/28/2021   Procedure: PERICARDIAL WINDOW;  Surgeon: Lajuana Matte, MD;  Location: [place];  Service: Thoracic;  Laterality: Right;  Right VATS.  Lazy lateral.  double lumen ET tube   PLACEMENT OF IMPELLA LEFT VENTRICULAR ASSIST DEVICE N/A 10/07/2021   Procedure: INSERTION OF RIGHT AXILLARY IMPELLA, DECANNULATION OF ECMO, AND MEDIASTINAL WASHOUT;  Surgeon: [name], MD;  Location: [place];  Service: Open Heart Surgery;  Laterality: N/A;  Open right axillary with 8 mm Hemashield Platinum Vascular graft.   REMOVAL OF IMPELLA LEFT VENTRICULAR ASSIST DEVICE Right 10/17/2021   Procedure: REMOVAL OF IMPELLA LEFT VENTRICULAR ASSIST DEVICE;  Surgeon: [name], MD;  Location: [place];  Service: Open Heart Surgery;  Laterality: Right;   STERNAL CLOSURE N/A 10/12/2021   Procedure: STERNAL CLOSURE;  Surgeon: [name], MD;  [facility];  Service: Thoracic;  Laterality: N/A;   TEE WITHOUT CARDIOVERSION N/A 09/12/2021   Procedure: TRANSESOPHAGEAL ECHOCARDIOGRAM (TEE);  Surgeon: [name], MD;  Location: [place];  Service: Open Heart Surgery;  Laterality: N/A;   TEE WITHOUT CARDIOVERSION N/A 09/29/2021   Procedure: TRANSESOPHAGEAL ECHOCARDIOGRAM (TEE);  Surgeon: Lajuana Matte, MD;  Location: [place];  Service: Open Heart Surgery;  Laterality: N/A;   TEE WITHOUT CARDIOVERSION N/A 10/03/2021   Procedure: TRANSESOPHAGEAL ECHOCARDIOGRAM (TEE);  Surgeon: Dahlia Byes, MD;  Location: [place];  Service: Thoracic;  Laterality: N/A;   TEE WITHOUT CARDIOVERSION  10/07/2021   Procedure: TRANSESOPHAGEAL ECHOCARDIOGRAM (TEE);  Surgeon:  [name], MD;  Location: [facility];  Service: Open Heart Surgery;;   TEE WITHOUT CARDIOVERSION N/A 10/12/2021   Procedure: TRANSESOPHAGEAL ECHOCARDIOGRAM (TEE);  Surgeon: [name], MD;  Location: [facility] OR;  Service: Thoracic;  Laterality: N/A;   TEE WITHOUT CARDIOVERSION N/A 10/17/2021   Procedure: TRANSESOPHAGEAL ECHOCARDIOGRAM (TEE);  Surgeon: [name], MD;  Location: [place];  Service: Open Heart Surgery;  Laterality: N/A;   TESTICLE SURGERY  1970   pt states testicle was ascended and had to be "pulled down"   TONSILLECTOMY     as a child    Allergies: Patient has no active allergies.  Medications: Prior to Admission medications   Medication Sig Start Date End Date Taking? Authorizing Provider  amiodarone (PACERONE) 200 MG tablet Take 1 tablet by mouth  twice daily for 2 days then take 1 tablet daily thereafter Patient taking differently: Take 200 mg by mouth daily. 09/20/21  Yes Stehler, Pricilla Larsson, PA-C  amLODipine (NORVASC) 10 MG tablet Take 1 tablet (10 mg total) by mouth daily. 08/21/21  Yes Charlott Rakes, MD  apixaban (ELIQUIS) 5 MG TABS tablet Take 1 tablet (5 mg total) by mouth 2 (  two) times daily. 09/20/21  Yes Stehler, Pricilla Larsson, PA-C  aspirin 325 MG tablet Take 325 mg by mouth daily.   Yes [provider]  atorvastatin (LIPITOR) 80 MG tablet Take 1 tablet (80 mg total) by mouth daily. 09/06/21  Yes Charlott Rakes, MD  carvedilol (COREG) 25 MG tablet Take 1 tablet (25 mg total) by mouth 2 (two) times daily with a meal. 09/20/21  Yes Stehler, Pricilla Larsson, PA-C  Dulaglutide (TRULICITY) 1.5 DZ/3.2DJ SOPN Inject 1.5 mg into the skin once a week. 06/10/21  Yes Charlott Rakes, MD  ferrous sulfate 325 (65 FE) MG tablet Take 1 tablet (325 mg total) by mouth daily with breakfast. Please take for 1 month, may stop sooner if develop constipation 09/20/21 09/20/22 Yes Stehler, Pricilla Larsson, PA-C  Insulin Glargine (BASAGLAR KWIKPEN) 100 UNIT/ML Inject 62 Units into the skin  daily. Patient taking differently: Inject 71 Units into the skin daily. 09/10/21  Yes [name], MD  oxyCODONE (OXY IR/ROXICODONE) 5 MG immediate release tablet Take 1 tablet (5 mg total) by mouth every 6 (six) hours as needed for severe pain. 09/20/21  Yes Stehler, Pricilla Larsson, PA-C  Semaglutide, 1 MG/DOSE, 4 MG/3ML SOPN Inject 1 mg as directed once a week. 07/12/21  Yes Charlott Rakes, MD  aspirin EC 81 MG tablet Take 1 tablet (81 mg total) by mouth daily. Swallow whole. Patient not taking: Reported on 09/28/2021 09/20/21   Magdalene [place], PA-C  Continuous Blood Gluc Sensor (FREESTYLE LIBRE 2 SENSOR) MISC Utilize as directed q 14 days to monitor blood sugar. 05/10/21   Charlott Rakes, MD  glucose blood (TRUE METRIX BLOOD GLUCOSE TEST) test strip Use to check blood sugar three times daily. 06/10/21   Charlott Rakes, MD  Insulin Pen Needle (TRUEPLUS 5-BEVEL PEN NEEDLES) 32G X 4 MM MISC Use to inject Basaglar once daily. 05/10/21   Charlott Rakes, MD  TRUEplus Lancets 28G MISC Use to check blood sugar three times daily. 06/10/21   Charlott Rakes, MD     History reviewed. No pertinent family history.  Social History   Socioeconomic History   Marital status: Legally Separated    Spouse name: Not on file   Number of children: 0   Years of education: Not on file   Highest education level: Not on file  Occupational History   Not on file  Tobacco Use   Smoking status: Former    Packs/day: 0.50    Types: Cigarettes    Quit date: 2017    Years since quitting: 6.7   Smokeless tobacco: Never  Vaping Use   Vaping Use: Some days   Start date: 10/14/2017   Last attempt to quit: 09/09/2021  Substance and Sexual Activity   Alcohol use: Yes    Comment: very occasional, maybe a beer or mixed drink once every few months   Drug use: Not Currently   Sexual activity: Not on file  Other Topics Concern   Not on file  Social History Narrative   Not on file   Social Determinants of Health   Financial  Resource Strain: Not on file  Food Insecurity: Not on file  Transportation Needs: Not on file  Physical Activity: Not on file  Stress: Not on file  Social Connections: Not on file    Review of Systems: A 12 point ROS discussed and pertinent positives are indicated in the HPI above.  All other systems are negative.  Review of Systems  Constitutional:  Positive for activity change, appetite  change and fatigue.  Respiratory:  Negative for shortness of breath.   Cardiovascular:  Negative for chest pain.  Gastrointestinal:  Negative for abdominal pain.    Vital Signs: BP 92/60   Pulse 82   Temp 97.7 F (36.5 C) (Oral)   Resp 15   Ht 5\' 7"  (5.597 m)   Wt 195 lb 15.8 oz (88.9 kg)   SpO2 96%   BMI 30.70 kg/m   Physical Exam Vitals reviewed.  Constitutional:      General: He is awake.     Appearance: He is ill-appearing.  HENT:     Mouth/Throat:     Mouth: Mucous membranes are dry.     Pharynx: Oropharynx is clear.  Cardiovascular:     Rate and Rhythm: Normal rate.     Pulses: Normal pulses.  Pulmonary:     Effort: Pulmonary effort is normal. No respiratory distress.  Abdominal:     General: Abdomen is flat.     Palpations: Abdomen is soft.  Skin:    General: Skin is warm and dry.  Neurological:     General: No focal deficit present.     Mental Status: He is oriented to person, place, and time.  Psychiatric:        Behavior: Behavior is cooperative.        Thought Content: Thought content normal.        Judgment: Judgment normal.     Imaging: DG Abd 1 View  Result Date: 11/14/2021 CLINICAL DATA:  Check gastric catheter placement EXAM: ABDOMEN - 1 VIEW COMPARISON:  10/31/2021 FINDINGS: Feeding catheter is noted extending into the second portion of the duodenum. No obstructive changes are seen. IMPRESSION: Feeding catheter in the second portion of the duodenum. Electronically Signed   By: Inez Catalina M.D.   On: 11/14/2021 03:11   DG Chest Port 1 View  Result  Date: 11/12/2021 CLINICAL DATA:  Shortness of breath.  Recent CABG. EXAM: PORTABLE CHEST 1 VIEW COMPARISON:  Chest radiograph November 06, 2021, CT chest 09/28/2021 FINDINGS: Postsurgical changes from median sternotomy and CABG. There is an enteric tube that courses below diaphragm with the tip out of the field of view. Left-sided tunneled central venous catheter in place with the tip in the mid SVC. Surgical clips are also seen in the right axilla. Cardiac and mediastinal contours are unchanged compared to recent chest radiograph. Redemonstrated are bibasilar pulmonary opacities and likely a small to moderate-sized left-sided pleural effusion. The hazy pulmonary opacity at the right lung base appears slightly more conspicuous on this exam. There are no displaced rib fractures. There are degenerative changes at the right-greater-than-left [facility] joint. Visualized upper abdomen is unremarkable. IMPRESSION: 1. Persistent bibasilar pulmonary opacities and likely small to moderate-sized left-sided pleural effusion. The hazy pulmonary opacity at the right lung base appears slightly more conspicuous on this exam, which could represent progressive atelectasis and a component of pulmonary edema. 2. Support apparatus as above. Electronically Signed   By: Marin Roberts M.D.   On: 11/12/2021 08:17   DG Chest Port 1 View  Result Date: 11/06/2021 CLINICAL DATA:  Cardiogenic shock.  Recent removal of tracheostomy EXAM: PORTABLE CHEST 1 VIEW COMPARISON:  11/02/2021 FINDINGS: Removal of tracheostomy tube. New large-bore central venous line with tip in the distal SVC. Removal of RIGHT IJ catheter. Feeding tube extends the stomach. Stable cardiac silhouette. LEFT pleural effusion basilar atelectasis. Mild venous congestion. IMPRESSION: 1. New large-bore central venous line with tip in the distal SVC. 2.  LEFT basilar atelectasis and effusion. Electronically Signed   By: Suzy Bouchard M.D.   On: 11/06/2021 08:19   IR Fluoro Guide  CV Line Left  Result Date: 11/05/2021 INDICATION: 66 year old with acute kidney injury and needs a tunneled dialysis catheter. EXAM: FLUOROSCOPIC AND ULTRASOUND GUIDED PLACEMENT OF A TUNNELED DIALYSIS CATHETER Physician: Stephan Minister. Anselm Pancoast, MD MEDICATIONS: Ancef 2 g, Dilaudid 1 mg ANESTHESIA/SEDATION: The patient was continuously monitored during the procedure by the interventional radiology nurse under my direct supervision. FLUOROSCOPY TIME:  Radiation Exposure Index (as provided by the fluoroscopic device): 3.1 mGy Kerma COMPLICATIONS: None immediate. PROCEDURE: Informed consent was obtained for placement of a tunneled dialysis catheter. The patient was placed supine on the interventional table. Ultrasound confirmed a patent left internal jugular vein. Ultrasound image obtained for documentation. The left neck and chest was prepped and draped in a sterile fashion. Maximal barrier sterile technique was utilized including caps, mask, sterile gowns, sterile gloves, sterile drape, hand hygiene and skin antiseptic. The left neck was anesthetized with 1% lidocaine. A small incision was made with #11 blade scalpel. A 21 gauge needle directed into the left internal jugular vein with ultrasound guidance. A micropuncture dilator set was placed. A 23 cm tip to cuff Palindrome catheter was selected. The skin below the left clavicle was anesthetized and a small incision was made with an #11 blade scalpel. A subcutaneous tunnel was formed to the vein dermatotomy site. The catheter was brought through the tunnel. The vein dermatotomy site was dilated to accommodate a peel-away sheath. The catheter was placed through the peel-away sheath and directed into the central venous structures. The tip of the catheter was placed at superior cavoatrial junction with fluoroscopy. Fluoroscopic images were obtained for documentation. Both lumens were found to aspirate and flush well. The proper amount of heparin was flushed in both lumens. The  vein dermatotomy site was closed using a single layer of absorbable suture and Dermabond. The catheter was secured to the skin using Prolene suture. IMPRESSION: Successful placement of a left jugular tunneled dialysis catheter using ultrasound and fluoroscopic guidance. Electronically Signed   By: Markus Daft M.D.   On: 11/05/2021 08:23   IR US Guide Vasc Access Left  Result Date: 11/05/2021 INDICATION: 36 year old with acute kidney injury and needs a tunneled dialysis catheter. EXAM: FLUOROSCOPIC AND ULTRASOUND GUIDED PLACEMENT OF A TUNNELED DIALYSIS CATHETER Physician: Stephan Minister. Anselm Pancoast, MD MEDICATIONS: Ancef 2 g, Dilaudid 1 mg ANESTHESIA/SEDATION: The patient was continuously monitored during the procedure by the interventional radiology nurse under my direct supervision. FLUOROSCOPY TIME:  Radiation Exposure Index (as provided by the fluoroscopic device): 3.1 mGy Kerma COMPLICATIONS: None immediate. PROCEDURE: Informed consent was obtained for placement of a tunneled dialysis catheter. The patient was placed supine on the interventional table. Ultrasound confirmed a patent left internal jugular vein. Ultrasound image obtained for documentation. The left neck and chest was prepped and draped in a sterile fashion. Maximal barrier sterile technique was utilized including caps, mask, sterile gowns, sterile gloves, sterile drape, hand hygiene and skin antiseptic. The left neck was anesthetized with 1% lidocaine. A small incision was made with #11 blade scalpel. A 21 gauge needle directed into the left internal jugular vein with ultrasound guidance. A micropuncture dilator set was placed. A 23 cm tip to cuff Palindrome catheter was selected. The skin below the left clavicle was anesthetized and a small incision was made with an #11 blade scalpel. A subcutaneous tunnel was formed to the vein dermatotomy site. The catheter  was brought through the tunnel. The vein dermatotomy site was dilated to accommodate a peel-away  sheath. The catheter was placed through the peel-away sheath and directed into the central venous structures. The tip of the catheter was placed at superior cavoatrial junction with fluoroscopy. Fluoroscopic images were obtained for documentation. Both lumens were found to aspirate and flush well. The proper amount of heparin was flushed in both lumens. The vein dermatotomy site was closed using a single layer of absorbable suture and Dermabond. The catheter was secured to the skin using Prolene suture. IMPRESSION: Successful placement of a left jugular tunneled dialysis catheter using ultrasound and fluoroscopic guidance. Electronically Signed   By: Markus Daft M.D.   On: 11/05/2021 08:23   VAS [place] UPPER EXTREMITY VENOUS DUPLEX  Result Date: 11/04/2021 UPPER VENOUS STUDY  Patient Name:  [name]  Date of Exam:   11/03/2021 Medical Rec #: [id number]     Accession #:    [id number] Date of Birth: 12-Jan-1961     Patient Gender: M Patient Age:   51 years Exam Location:  [facility] Procedure:      VAS [place] UPPER EXTREMITY VENOUS DUPLEX Referring Phys: Hayden Pedro --------------------------------------------------------------------------------  Indications: Edema Limitations: Trach collar, line and bandages. Comparison Study: No prior study Performing Technologist: Sharion Dove RVS  Examination Guidelines: A complete evaluation includes B-mode imaging, spectral Doppler, color Doppler, and power Doppler as needed of all accessible portions of each vessel. Bilateral testing is considered an integral part of a complete examination. Limited examinations for reoccurring indications may be performed as noted.  Right Findings: +----------+------------+---------+-----------+----------+--------------+ RIGHT     CompressiblePhasicitySpontaneousProperties   Summary     +----------+------------+---------+-----------+----------+--------------+ IJV                                                 Not  visualized +----------+------------+---------+-----------+----------+--------------+ Subclavian               Yes       Yes                             +----------+------------+---------+-----------+----------+--------------+ Axillary                 Yes       Yes                             +----------+------------+---------+-----------+----------+--------------+ Brachial      Full                                                 +----------+------------+---------+-----------+----------+--------------+ Radial        Full                                                 +----------+------------+---------+-----------+----------+--------------+ Ulnar         Full                                                 +----------+------------+---------+-----------+----------+--------------+  Cephalic      Full                                                 +----------+------------+---------+-----------+----------+--------------+ Basilic       Full                                                 +----------+------------+---------+-----------+----------+--------------+  Left Findings: +----------+------------+---------+-----------+----------+--------------+ LEFT      CompressiblePhasicitySpontaneousProperties   Summary     +----------+------------+---------+-----------+----------+--------------+ IJV                                                 Not visualized +----------+------------+---------+-----------+----------+--------------+ Subclavian               Yes       Yes                             +----------+------------+---------+-----------+----------+--------------+ Axillary                 Yes       Yes                             +----------+------------+---------+-----------+----------+--------------+ Brachial      Full                                                  +----------+------------+---------+-----------+----------+--------------+ Radial        Full                                                 +----------+------------+---------+-----------+----------+--------------+ Ulnar                                               Not visualized +----------+------------+---------+-----------+----------+--------------+ Cephalic      Full                                                 +----------+------------+---------+-----------+----------+--------------+ Basilic                                             Not visualized +----------+------------+---------+-----------+----------+--------------+  Summary:  Right: No evidence of deep vein thrombosis in the visualized veins of the upper extremity. No evidence of superficial vein thrombosis in the upper extremity.  Left: No evidence of deep vein thrombosis in the visualized veins of  the upper extremity. No evidence of superficial vein thrombosis in the upper extremity.  *See table(s) above for measurements and observations.  Diagnosing physician: Jamelle Haring Electronically signed by Jamelle Haring on 11/04/2021 at 11:14:46 AM.    Final    VAS [place] LOWER EXTREMITY VENOUS (DVT)  Result Date: 11/04/2021  Lower Venous DVT Study Patient Name:  [name] [facility]  Date of Exam:   11/03/2021 Medical Rec #: [id number]     Accession #:    [id number] Date of Birth: 1960-09-05     Patient Gender: M Patient Age:   16 years Exam Location:  [facility] Procedure:      VAS [place] LOWER EXTREMITY VENOUS (DVT) Referring Phys: Hayden Pedro --------------------------------------------------------------------------------  Indications: Edema.  Limitations: Patient not cooperative. Comparison Study: No prior study Performing Technologist: Sharion Dove RVS  Examination Guidelines: A complete evaluation includes B-mode imaging, spectral Doppler, color Doppler, and power Doppler as needed of all accessible portions of each  vessel. Bilateral testing is considered an integral part of a complete examination. Limited examinations for reoccurring indications may be performed as noted. The reflux portion of the exam is performed with the patient in reverse Trendelenburg.  +--------+---------------+---------+-----------+----------------+-------------+ RIGHT   CompressibilityPhasicitySpontaneityProperties      Thrombus                                                                 Aging         +--------+---------------+---------+-----------+----------------+-------------+ CFV     Full                               pulsatile                                                                waveforms                     +--------+---------------+---------+-----------+----------------+-------------+ SFJ     Full                                                             +--------+---------------+---------+-----------+----------------+-------------+ FV Prox Full                                                             +--------+---------------+---------+-----------+----------------+-------------+ FV Mid  Full                                                             +--------+---------------+---------+-----------+----------------+-------------+  FV      Full                                                             Distal                                                                   +--------+---------------+---------+-----------+----------------+-------------+ PFV     Full                                                             +--------+---------------+---------+-----------+----------------+-------------+ POP     Full                               pulsatile                                                                waveforms                     +--------+---------------+---------+-----------+----------------+-------------+    +---------+---------------+---------+-----------+---------------+--------------+ LEFT     CompressibilityPhasicitySpontaneityProperties     Thrombus Aging +---------+---------------+---------+-----------+---------------+--------------+ CFV                                         pulsatile      patent by                                                  waveforms      color and                                                                 Doppler        +---------+---------------+---------+-----------+---------------+--------------+ FV Prox  Full                                                             +---------+---------------+---------+-----------+---------------+--------------+ FV Mid   Full                                                             +---------+---------------+---------+-----------+---------------+--------------+  FV DistalFull                                                             +---------+---------------+---------+-----------+---------------+--------------+ PFV      Full                                                             +---------+---------------+---------+-----------+---------------+--------------+ POP      Full                               pulsatile                                                                 waveforms                     +---------+---------------+---------+-----------+---------------+--------------+ PTV      Full                                                             +---------+---------------+---------+-----------+---------------+--------------+ PERO     Full                                                             +---------+---------------+---------+-----------+---------------+--------------+     Summary: BILATERAL: - No evidence of deep vein thrombosis seen in the lower extremities, bilaterally. -No evidence of popliteal cyst, bilaterally.   *See table(s)  above for measurements and observations. Electronically signed by Jamelle Haring on 11/04/2021 at 11:14:35 AM.    Final    DG CHEST PORT 1 VIEW  Result Date: 11/02/2021 CLINICAL DATA:  Respiratory failure. EXAM: PORTABLE CHEST 1 VIEW COMPARISON:  October 29, 2021 FINDINGS: Stable support apparatus. Enlarged cardiac silhouette. Bibasilar, left greater than right streaky airspace opacities. Osseous structures are without acute abnormality. Soft tissues are grossly normal. IMPRESSION: Bibasilar, left greater than right streaky airspace opacities may represent atelectasis or airspace consolidation. Electronically Signed   By: Fidela Salisbury M.D.   On: 11/02/2021 14:26   DG Abd Portable 1V  Result Date: 10/31/2021 CLINICAL DATA:  Feeding tube placement EXAM: PORTABLE ABDOMEN - 1 VIEW COMPARISON:  10/29/2021 FINDINGS: Feeding tube coiled in the stomach with the tip in the region of the gastric antrum or proximal duodenum. This has been pulled back since the prior study. Normal bowel gas pattern. IMPRESSION: Feeding tube tip in the region of the gastric antrum or proximal duodenum. Electronically Signed   By: Juanda Crumble  [name] M.D.   On: 10/31/2021 16:43   DG Abd Portable 1V  Result Date: 10/29/2021 CLINICAL DATA:  Feeding tube placement EXAM: PORTABLE ABDOMEN - 1 VIEW COMPARISON:  Previous studies including the examination done earlier today FINDINGS: There is interval change in location of tip of feeding tube which is noted in the course of proximal jejunum. There is NG tube with its tip in the stomach. Bowel gas pattern is nonspecific. IMPRESSION: Tip of feeding tube is seen in the proximal course of jejunum. Electronically Signed   By: Elmer Picker M.D.   On: 10/29/2021 14:42   DG CHEST PORT 1 VIEW  Result Date: 10/29/2021 CLINICAL DATA:  Respiratory failure. EXAM: PORTABLE CHEST 1 VIEW COMPARISON:  Chest x-ray dated October 23, 2021. FINDINGS: Unchanged tracheostomy tube and right internal  jugular central venous catheter. New left upper extremity PICC line with tip at the cavoatrial junction. Unchanged enteric and feeding tubes. Stable cardiomediastinal silhouette status post CABG. Normal pulmonary vascularity. Improved aeration at the left lung base. No pneumothorax. No acute osseous abnormality. IMPRESSION: 1. New left upper extremity PICC line in good position. 2. Improved aeration at the left lung base. Electronically Signed   By: Titus Dubin M.D.   On: 10/29/2021 08:23   DG Abd 1 View  Result Date: 10/29/2021 CLINICAL DATA:  Abdominal distention. EXAM: ABDOMEN - 1 VIEW COMPARISON:  Abdominal x-ray dated October 24, 2021. FINDINGS: Feeding tube tip has retracted in the interval, with new redundancy in the stomach and the tip now likely within the first portion of the duodenum. Unchanged large bore enteric tube in the stomach. The bowel gas pattern is normal. No radio-opaque calculi or other significant radiographic abnormality are seen. IMPRESSION: 1. Feeding tube tip has retracted in the interval, with new redundancy in the stomach and the tip now likely within the first portion of the duodenum. Electronically Signed   By: Titus Dubin M.D.   On: 10/29/2021 08:13   [place] EKG SITE RITE  Result Date: 10/27/2021 If Site Rite image not attached, placement could not be confirmed due to current cardiac rhythm.  DG Abd Portable 1V  Result Date: 10/24/2021 CLINICAL DATA:  Feeding tube placement EXAM: PORTABLE ABDOMEN - 1 VIEW COMPARISON:  10/21/2021 FINDINGS: Nasogastric tube with the tip projecting over the stomach. Feeding tube with the tip projecting over the duodenal jejunal flexure. No bowel dilatation to suggest obstruction. No evidence of pneumoperitoneum, portal venous gas or pneumatosis. No pathologic calcifications along the expected course of the ureters. No acute osseous abnormality. IMPRESSION: 1. Nasogastric tube with the tip projecting over the stomach. Feeding tube with  the tip projecting over the duodenal jejunal flexure. Electronically Signed   By: Kathreen Devoid M.D.   On: 10/24/2021 12:45   ECHOCARDIOGRAM LIMITED  Result Date: 10/23/2021    ECHOCARDIOGRAM LIMITED REPORT   Patient Name:   [name] [facility] Date of Exam: 10/23/2021 Medical Rec #:  [id number]    Height:       67.0 in Accession #:    [id number]   Weight:       201.9 lb Date of Birth:  25-May-1960    BSA:          2.031 m Patient Age:    89 years     BP:           108/44 mmHg Patient Gender: M            HR:  56 bpm. Exam Location:  Inpatient Procedure: Limited Echo, Cardiac Doppler, Color Doppler and Intracardiac            Opacification Agent Indications:    CHF  History:        Patient has prior history of Echocardiogram examinations, most                 recent 10/07/2021. Cardiomyopathy, Pericardial Disease,                 Arrythmias:Cardiac Arrest; Risk Factors:Hypertension, Diabetes                 and Current Smoker.  Sonographer:    Harvie Junior Referring Phys: [phone] [facility] [name]  Sonographer Comments: Technically difficult study due to poor echo windows, no parasternal window, no subcostal window and patient is obese. IMPRESSIONS  1. Left ventricular ejection fraction, by estimation, is 65 to 70%. The left ventricle has normal function. The left ventricle has no regional wall motion abnormalities. There is mild concentric left ventricular hypertrophy. Left ventricular diastolic parameters are consistent with Grade I diastolic dysfunction (impaired relaxation).  2. Right ventricular systolic function is mildly reduced. The right ventricular size is normal.  3. Aortic valve regurgitation is trivial. No aortic stenosis is present.  4. The mitral valve was not well visualized. No evidence of mitral valve regurgitation.  5. Technically very difficult echo, limited images available. FINDINGS  Left Ventricle: Left ventricular ejection fraction, by estimation, is 65 to 70%. The left ventricle has normal  function. The left ventricle has no regional wall motion abnormalities. Definity contrast agent was given IV to delineate the left ventricular  endocardial borders. The left ventricular internal cavity size was normal in size. There is mild concentric left ventricular hypertrophy. Left ventricular diastolic parameters are consistent with Grade I diastolic dysfunction (impaired relaxation). Right Ventricle: The right ventricular size is normal. Right ventricular systolic function is mildly reduced. Left Atrium: Left atrial size was normal in size. Right Atrium: Right atrial size was normal in size. Mitral Valve: The mitral valve was not well visualized. Tricuspid Valve: The tricuspid valve is not well visualized. Aortic Valve: Aortic valve regurgitation is trivial. Aortic regurgitation PHT measures 792 msec. No aortic stenosis is present. Aortic valve mean gradient measures 3.0 mmHg. Aortic valve peak gradient measures 6.0 mmHg. IAS/Shunts: No atrial level shunt detected by color flow Doppler.  LV Volumes (MOD) LV vol d, MOD A2C: 89.3 ml  Diastology LV vol d, MOD A4C: 160.0 ml LV e' medial:    6.96 cm/s LV vol s, MOD A2C: 59.0 ml  LV E/e' medial:  10.5 LV vol s, MOD A4C: 104.0 ml LV e' lateral:   7.83 cm/s LV SV MOD A2C:     30.3 ml  LV E/e' lateral: 9.4 LV SV MOD A4C:     160.0 ml LV SV MOD BP:      42.1 ml                              3D Volume EF:                             3D EF:        23 %  LV EDV:       159 ml                             LV ESV:       123 ml                             LV SV:        36 ml RIGHT VENTRICLE RV S prime:     10.50 cm/s TAPSE (M-mode): 1.2 cm LEFT ATRIUM           Index LA Vol (A4C): 40.8 ml 20.09 ml/m  AORTIC VALVE AV Vmax:           122.00 cm/s AV Vmean:          83.200 cm/s AV VTI:            0.208 m AV Peak Grad:      6.0 mmHg AV Mean Grad:      3.0 mmHg LVOT Vmax:         101.00 cm/s LVOT Vmean:        61.200 cm/s LVOT VTI:          0.195 m LVOT/AV  VTI ratio: 0.94 AI PHT:            792 msec MITRAL VALVE MV Area (PHT): 3.23 cm    SHUNTS MV Decel Time: 235 msec    Systemic VTI: 0.20 m MV E velocity: 73.30 cm/s MV A velocity: 73.70 cm/s MV E/A ratio:  0.99 [name] Electronically signed by Franki Monte Signature Date/Time: 10/23/2021/4:28:53 PM    Final    DG Chest Port 1 View  Result Date: 10/23/2021 CLINICAL DATA:  A 61 year old male presents with chest pain and shortness of breath. History of stroke. EXAM: PORTABLE CHEST 1 VIEW COMPARISON:  October 21, 2021 FINDINGS: RIGHT IJ dialysis catheter tip in the upper superior vena cava similar to prior study. Tracheostomy tube in stable position, tip between clavicular heads. Feeding tube and gastric tube coursing into the abdomen. Feeding tube tip in the descending duodenum. Gastric tube tip in the stomach. Post median sternotomy as before. Stable cardiomediastinal contours. Signs of percutaneous coronary intervention as well. Decreased aeration at the LEFT lung base with potential reaccumulating pleural fluid in the LEFT lower chest since previous imaging. No pneumothorax. RIGHT chest is clear. On limited assessment no acute skeletal findings. IMPRESSION: 1. Decreased aeration/developing airspace process with potential LEFT pleural effusion with reaccumulation since previous imaging. 2. Stable support devices. Electronically Signed   By: Zetta Bills M.D.   On: 10/23/2021 09:37   DG Abd 1 View  Result Date: 10/21/2021 CLINICAL DATA:  Feeding tube placement. EXAM: ABDOMEN - 1 VIEW COMPARISON:  None Available. FINDINGS: The bowel gas pattern is normal. Distal tip of nasogastric tube is seen in expected position of proximal stomach. Distal tip of feeding tube is seen in second portion of duodenum. No radio-opaque calculi or other significant radiographic abnormality are seen. IMPRESSION: Distal tip of nasogastric tube seen in expected position of proximal stomach. Distal tip of feeding tube seen  in expected position of second portion of duodenum. Electronically Signed   By: Marijo Conception M.D.   On: 10/21/2021 08:37   DG Chest Port 1 View  Result Date: 10/21/2021 CLINICAL DATA:  Status post CABG EXAM: PORTABLE CHEST 1 VIEW COMPARISON:  10/19/2021 FINDINGS:  There is a right IJ catheter with tip in the projection of the SVC. Stable tracheostomy tube. Feeding tube and NG tube are stable with tips below the level of the hemidiaphragms. Interval decrease in left pleural effusion with improved aeration to the left lung base. IMPRESSION: Decrease in left pleural effusion with improved aeration to the left lung base. Electronically Signed   By: Kerby Moors M.D.   On: 10/21/2021 06:50   DG CHEST PORT 1 VIEW  Result Date: 10/19/2021 CLINICAL DATA:  Central venous catheter in place EXAM: PORTABLE CHEST 1 VIEW COMPARISON:  Chest x-rays dated 10/17/2021. FINDINGS: Tracheostomy tube is stable in position with tip in the midline. LEFT IJ central line is stable in position with tip at the level of the upper SVC. RIGHT IJ Cordis in place with tip above the level of the SVC. Enteric tube passes below the diaphragm. Heart size and mediastinal contours are grossly stable. Persistent opacity at the LEFT lung base. RIGHT lung is clear. No pneumothorax is seen. IMPRESSION: 1. Support apparatus appears appropriately positioned, as detailed above. 2. Persistent opacity at the LEFT lung base, likely atelectasis and/or small pleural effusion. No new lung findings. No pneumothorax. Electronically Signed   By: Franki Cabot M.D.   On: 10/19/2021 12:24   DG CHEST PORT 1 VIEW  Result Date: 10/17/2021 CLINICAL DATA:  Impella device removed.  New trach placed. EXAM: PORTABLE CHEST 1 VIEW COMPARISON:  10/17/2021 and earlier FINDINGS: Tracheostomy tube is in place, tip overlying the level of the proximal trachea. Feeding tube and nasogastric tube are in place, tip beyond the level of the image. LEFT IJ central line tip overlies  the superior vena cava. RIGHT subclavian line has been removed. Impella device has been removed. Heart size is normal. There is patchy opacity in the LEFT lung base, similar to prior study. Cardiomegaly, stable. IMPRESSION: 1. Stable cardiomegaly. 2. Stable LEFT LOWER lobe atelectasis or infiltrate. 3. Tracheostomy tube in good position. Electronically Signed   By: Nolon Nations M.D.   On: 10/17/2021 10:54   DG Abd 1 View  Result Date: 10/17/2021 CLINICAL DATA:  Postop. Impella device removed. New tracheostomy placed. New nasogastric tube. EXAM: ABDOMEN - 1 VIEW COMPARISON:  10/15/2021 FINDINGS: Interval removal of Impella device. Nasogastric tube is in place, tip overlying the level of the stomach. Side port is below the expected location of the gastroesophageal junction. Feeding tube tip overlies the region of the descending duodenum. There is minimal LEFT LOWER lobe atelectasis. IMPRESSION: Feeding tube and nasogastric tube as described. Electronically Signed   By: Nolon Nations M.D.   On: 10/17/2021 10:50   DG CHEST PORT 1 VIEW  Result Date: 10/17/2021 CLINICAL DATA:  Heart surgery EXAM: PORTABLE CHEST 1 VIEW COMPARISON:  10/15/2021 FINDINGS: ETT terminates within the midthoracic trachea. Enteric tube courses below the diaphragm with distal tip beyond the inferior margin of the film. Left IJ central venous catheter terminates in the proximal SVC. Impella left ventricular assist device remains in place. Stable cardiomegaly. Similar degree of left basilar opacity, likely atelectasis. No pneumothorax. IMPRESSION: 1. Stable support apparatus as above. 2. Similar degree of left basilar opacity, likely atelectasis. Electronically Signed   By: Davina Poke D.O.   On: 10/17/2021 08:26    Labs:  CBC: Recent Labs    11/12/21 0315 11/13/21 0311 11/14/21 0427 11/15/21 0623  WBC 9.1 9.5 10.6* 9.5  HGB 8.3* 9.0* 8.8* 9.2*  HCT 28.3* 31.0* 29.3* 30.3*  PLT 432* 418* 426*  425*    COAGS: Recent  Labs    10/06/21 1600 10/07/21 0408 10/07/21 1530 10/08/21 0350 10/15/21 1519 10/16/21 0407 10/19/21 0441 10/20/21 0454 10/21/21 0403 11/14/21 1659  INR  --  1.4* 1.6*   < > 1.3*   < > 1.3* 1.3* 1.3* 1.1  APTT 44* 46* 124*  --  35  --   --   --   --   --    < > = values in this interval not displayed.    BMP: Recent Labs    11/12/21 0315 11/13/21 0311 11/14/21 0427 11/15/21 0623  NA 135 135 131* 133*  K 4.8 4.4 4.6 4.6  CL 94* 92* 91* 92*  CO2 24 27 24 23   GLUCOSE 134* 91 114* 81  BUN 103* 58* 87* 72*  CALCIUM 9.5 9.3 9.1 9.2  CREATININE 5.77* 3.81* 5.21* 4.09*  GFRNONAA 10* 17* 12* 16*    LIVER FUNCTION TESTS: Recent Labs    10/05/21 0441 10/06/21 1600 10/07/21 0408 10/08/21 0350 10/22/21 0406 10/22/21 1529 11/12/21 0315 11/13/21 0311 11/14/21 0427 11/15/21 0623  BILITOT 1.0 2.2* 1.1  --  1.0  --   --   --   --   --   AST 28 99* 97*  --  38  --   --   --   --   --   ALT 11 10 15   --  22  --   --   --   --   --   ALKPHOS 37* 73 89  --  137*  --   --   --   --   --   PROT 3.7* 3.9* 4.1*  --  6.2*  --   --   --   --   --   ALBUMIN 2.0* 1.9* 1.9*   < > 2.4*  2.4*   < > 2.5* 2.5* 2.5* 2.9*   < > = values in this interval not displayed.    Assessment and Plan:  Mr Matheu Ploeger is 61 year old male with extended admission after cardiac arrest involving multiple interventions.  He is dependent on tube feeds and IR has been asked for percutaneous gastrostomy tube for transition away from the NG tube.  Patient is NPO, on 81mg  ASA, expresses understanding of procedure and wishes to proceed.    Labs, imaging, meds, and history reviewed.  Tentatively planning for placement this afternoon.  Risks and benefits image guided gastrostomy tube placement was discussed with the patient including, but not limited to the need for a barium enema during the procedure, bleeding, infection, peritonitis and/or damage to adjacent structures.  All of the patient's questions were  answered, patient is agreeable to proceed.  Consent signed and in chart.   Thank you for this interesting consult.  I greatly enjoyed meeting [name] and look forward to participating in their care.  A copy of this report was sent to the requesting provider on this date.  Electronically Signed: Pasty Spillers, PA 11/15/2021, 2:57 PM   I spent a total of 40 Minutes in face to face in clinical consultation, greater than 50% of which was counseling/coordinating care for g-tube placement

## 2021-11-16 LAB — GLUCOSE, CAPILLARY
Glucose-Capillary: 108 mg/dL — ABNORMAL HIGH (ref 70–99)
Glucose-Capillary: 126 mg/dL — ABNORMAL HIGH (ref 70–99)
Glucose-Capillary: 129 mg/dL — ABNORMAL HIGH (ref 70–99)
Glucose-Capillary: 130 mg/dL — ABNORMAL HIGH (ref 70–99)
Glucose-Capillary: 133 mg/dL — ABNORMAL HIGH (ref 70–99)

## 2021-11-16 LAB — CBC
HCT: 30.1 % — ABNORMAL LOW (ref 39.0–52.0)
Hemoglobin: 8.8 g/dL — ABNORMAL LOW (ref 13.0–17.0)
MCH: 27.9 pg (ref 26.0–34.0)
MCHC: 29.2 g/dL — ABNORMAL LOW (ref 30.0–36.0)
MCV: 95.6 fL (ref 80.0–100.0)
Platelets: 452 10*3/uL — ABNORMAL HIGH (ref 150–400)
RBC: 3.15 MIL/uL — ABNORMAL LOW (ref 4.22–5.81)
RDW: 17.7 % — ABNORMAL HIGH (ref 11.5–15.5)
WBC: 8.5 10*3/uL (ref 4.0–10.5)
nRBC: 0.4 % — ABNORMAL HIGH (ref 0.0–0.2)

## 2021-11-16 LAB — RENAL FUNCTION PANEL
Albumin: 2.6 g/dL — ABNORMAL LOW (ref 3.5–5.0)
Anion gap: 15 (ref 5–15)
BUN: 88 mg/dL — ABNORMAL HIGH (ref 8–23)
CO2: 24 mmol/L (ref 22–32)
Calcium: 9.3 mg/dL (ref 8.9–10.3)
Chloride: 93 mmol/L — ABNORMAL LOW (ref 98–111)
Creatinine, Ser: 5.48 mg/dL — ABNORMAL HIGH (ref 0.61–1.24)
GFR, Estimated: 11 mL/min — ABNORMAL LOW (ref >60)
Glucose, Bld: 115 mg/dL — ABNORMAL HIGH (ref 70–99)
Phosphorus: 4.7 mg/dL — ABNORMAL HIGH (ref 2.5–4.6)
Potassium: 4.7 mmol/L (ref 3.5–5.1)
Sodium: 132 mmol/L — ABNORMAL LOW (ref 135–145)

## 2021-11-16 LAB — MAGNESIUM: Magnesium: 1.9 mg/dL (ref 1.7–2.4)

## 2021-11-16 MED ORDER — HEPARIN SODIUM (PORCINE) 1000 UNIT/ML IJ SOLN
INTRAMUSCULAR | Status: AC
  Filled 2021-11-16: qty 4

## 2021-11-16 MED ORDER — HEPARIN SODIUM (PORCINE) 1000 UNIT/ML DIALYSIS
40.0000 [IU]/kg | INTRAMUSCULAR | Status: DC | PRN
  Administered 2021-11-16: 3600 [IU] via INTRAVENOUS_CENTRAL
  Filled 2021-11-16: qty 4

## 2021-11-16 NOTE — Procedures (Signed)
HD Note:  Some information was entered later than the data was gathered due to patient care needs. The stated time with the data is accurate. Received patient in bed to unit.   Alert and oriented.  Informed consent signed and in chart.     Patient tolerated treatment well. BP was low at the beginning of treatment, improved after midodrine given. Patient had 3 small, liquid stools in the bedpan. He was restless during the treatment and questioned several times why he needed to stay the entire time.  When it was explained to him, he agreed to stay. Transported back to the room  Alert, without acute distress.  Hand-off given to patient's nurse.     Total UF removed: 1300 Medication(s) given: Midodrine as scheduled, Heparin as ordered during treatment.    [facility]

## 2021-11-16 NOTE — Procedures (Signed)
Patient seen on Hemodialysis. BP 103/60   Pulse 86   Temp 98.1 F (36.7 C) (Oral)   Resp 20   Ht 5\' 7"  (1.702 m)   Wt 89.4 kg   SpO2 96%   BMI 30.87 kg/m   QB 400, UF goal 1L Tolerating treatment without complaints at this time.   Elmarie Shiley MD [facility]. Office # [phone] Pager # [phone] 9:41 AM

## 2021-11-16 NOTE — Progress Notes (Signed)
Supervising Physician: Daryll Brod  Patient Status:  [facility] - In-pt  Chief Complaint:  Failed g-tube placement with mod sedation 10/6  Subjective:  Patient reports not realizing he would be awake for procedure when sedating medications were used.  He reports he is not able to proceed unless general anesthesia.    Allergies: Patient has no active allergies.  Medications: Prior to Admission medications   Medication Sig Start Date End Date Taking? Authorizing Provider  amiodarone (PACERONE) 200 MG tablet Take 1 tablet by mouth  twice daily for 2 days then take 1 tablet daily thereafter Patient taking differently: Take 200 mg by mouth daily. 09/20/21  Yes Stehler, Pricilla Larsson, PA-C  amLODipine (NORVASC) 10 MG tablet Take 1 tablet (10 mg total) by mouth daily. 08/21/21  Yes Charlott Rakes, MD  apixaban (ELIQUIS) 5 MG TABS tablet Take 1 tablet (5 mg total) by mouth 2 (two) times daily. 09/20/21  Yes Stehler, Pricilla Larsson, PA-C  aspirin 325 MG tablet Take 325 mg by mouth daily.   Yes [provider]  atorvastatin (LIPITOR) 80 MG tablet Take 1 tablet (80 mg total) by mouth daily. 09/06/21  Yes Charlott Rakes, MD  carvedilol (COREG) 25 MG tablet Take 1 tablet (25 mg total) by mouth 2 (two) times daily with a meal. 09/20/21  Yes Stehler, Pricilla Larsson, PA-C  Dulaglutide (TRULICITY) 1.5 EU/2.3NT SOPN Inject 1.5 mg into the skin once a week. 06/10/21  Yes Charlott Rakes, MD  ferrous sulfate 325 (65 FE) MG tablet Take 1 tablet (325 mg total) by mouth daily with breakfast. Please take for 1 month, may stop sooner if develop constipation 09/20/21 09/20/22 Yes Stehler, Pricilla Larsson, PA-C  Insulin Glargine (BASAGLAR KWIKPEN) 100 UNIT/ML Inject 62 Units into the skin daily. Patient taking differently: Inject 71 Units into the skin daily. 09/10/21  Yes [name], MD  oxyCODONE (OXY IR/ROXICODONE) 5 MG immediate release tablet Take 1 tablet (5 mg total) by mouth every 6 (six) hours as needed for severe pain.  09/20/21  Yes Stehler, Pricilla Larsson, PA-C  Semaglutide, 1 MG/DOSE, 4 MG/3ML SOPN Inject 1 mg as directed once a week. 07/12/21  Yes Charlott Rakes, MD  aspirin EC 81 MG tablet Take 1 tablet (81 mg total) by mouth daily. Swallow whole. Patient not taking: Reported on 09/28/2021 09/20/21   Magdalene River, PA-C  Continuous Blood Gluc Sensor (FREESTYLE LIBRE 2 SENSOR) MISC Utilize as directed q 14 days to monitor blood sugar. 05/10/21   Charlott Rakes, MD  glucose blood (TRUE METRIX BLOOD GLUCOSE TEST) test strip Use to check blood sugar three times daily. 06/10/21   Charlott Rakes, MD  Insulin Pen Needle (TRUEPLUS 5-BEVEL PEN NEEDLES) 32G X 4 MM MISC Use to inject Basaglar once daily. 05/10/21   Charlott Rakes, MD  TRUEplus Lancets 28G MISC Use to check blood sugar three times daily. 06/10/21   Charlott Rakes, MD     Vital Signs: BP (!) 90/49   Pulse 89   Temp 97.9 F (36.6 C)   Resp 18   Ht 5\' 7"  (1.702 m)   Wt 199 lb 15.3 oz (90.7 kg)   SpO2 100%   BMI 31.32 kg/m   Physical Exam Vitals reviewed.  Constitutional:      General: He is not in acute distress.    Appearance: He is ill-appearing.  HENT:     Head: Normocephalic and atraumatic.  Cardiovascular:     Rate and Rhythm: Normal rate.  Skin:  General: Skin is dry.  Neurological:     General: No focal deficit present.     Mental Status: He is alert and oriented to person, place, and time.  Psychiatric:        Mood and Affect: Mood normal.        Behavior: Behavior normal.     Imaging: IR GASTROSTOMY TUBE MOD SED  Result Date: 11/15/2021 INDICATION: 61 year old male presents for image guided percutaneous gastrostomy EXAM: IMAGE GUIDED PERCUTANEOUS GASTROSTOMY DISCONTINUED GASTROSTOMY MEDICATIONS: None ANESTHESIA/SEDATION: Versed 1.0 mg IV; Fentanyl 50 mcg IV Moderate Sedation Time:  10 minutes The patient was continuously monitored during the procedure by the interventional radiology nurse under my direct supervision. CONTRAST:   0 FLUOROSCOPY: Radiation Exposure Index (as provided by the fluoroscopic device): 30 mGy Kerma COMPLICATIONS: None PROCEDURE: Informed written consent was obtained from the patient and the patient's family after a thorough discussion of the procedural risks, benefits and alternatives. All questions were addressed. Maximal Sterile Barrier Technique was utilized including caps, mask, sterile gowns, sterile gloves, sterile drape, hand hygiene and skin antiseptic. A timeout was performed prior to the initiation of the procedure. The patient was brought to the interventional suite. A time-out was performed, and moderate sedation was initiated, as the patient was uncomfortable with the initial orogastric tube placement. After the initial or gastric tube placement attempt, the patient withdrew his consent and did not want to proceed. After some discussion with the patient regarding using either or gastric tube placement or the existing nasogastric tube placement, it was clear he would not proceed and we discontinue the procedure. IMPRESSION: Discontinued attempt at image guided gastrostomy. Signed, Dulcy Fanny. Nadene Rubins, RPVI Vascular and Interventional Radiology Specialists [facility] Radiology PLAN: We will arrange anaesthesia support for image guided gastrostomy. Electronically Signed   By: Corrie Mckusick D.O.   On: 11/15/2021 16:15   DG Abd 1 View  Result Date: 11/14/2021 CLINICAL DATA:  Check gastric catheter placement EXAM: ABDOMEN - 1 VIEW COMPARISON:  10/31/2021 FINDINGS: Feeding catheter is noted extending into the second portion of the duodenum. No obstructive changes are seen. IMPRESSION: Feeding catheter in the second portion of the duodenum. Electronically Signed   By: Inez Catalina M.D.   On: 11/14/2021 03:11    Labs:  CBC: Recent Labs    11/13/21 0311 11/14/21 0427 11/15/21 0623 11/16/21 0400  WBC 9.5 10.6* 9.5 8.5  HGB 9.0* 8.8* 9.2* 8.8*  HCT 31.0* 29.3* 30.3* 30.1*  PLT 418* 426* 425*  452*    COAGS: Recent Labs    10/06/21 1600 10/07/21 0408 10/07/21 1530 10/08/21 0350 10/15/21 1519 10/16/21 0407 10/19/21 0441 10/20/21 0454 10/21/21 0403 11/14/21 1659  INR  --  1.4* 1.6*   < > 1.3*   < > 1.3* 1.3* 1.3* 1.1  APTT 44* 46* 124*  --  35  --   --   --   --   --    < > = values in this interval not displayed.    BMP: Recent Labs    11/13/21 0311 11/14/21 0427 11/15/21 0623 11/16/21 0400  NA 135 131* 133* 132*  K 4.4 4.6 4.6 4.7  CL 92* 91* 92* 93*  CO2 27 24 23 24   GLUCOSE 91 114* 81 115*  BUN 58* 87* 72* 88*  CALCIUM 9.3 9.1 9.2 9.3  CREATININE 3.81* 5.21* 4.09* 5.48*  GFRNONAA 17* 12* 16* 11*    LIVER FUNCTION TESTS: Recent Labs    10/05/21 0441 10/06/21  1600 10/07/21 0408 10/08/21 0350 10/22/21 0406 10/22/21 1529 11/13/21 0311 11/14/21 0427 11/15/21 0623 11/16/21 0400  BILITOT 1.0 2.2* 1.1  --  1.0  --   --   --   --   --   AST 28 99* 97*  --  38  --   --   --   --   --   ALT 11 10 15   --  22  --   --   --   --   --   ALKPHOS 37* 73 89  --  137*  --   --   --   --   --   PROT 3.7* 3.9* 4.1*  --  6.2*  --   --   --   --   --   ALBUMIN 2.0* 1.9* 1.9*   < > 2.4*  2.4*   < > 2.5* 2.5* 2.9* 2.6*   < > = values in this interval not displayed.    Assessment and Plan:  Failed G-tube attempt, still in need of G-tube placement Now scheduled with General Anesthesia on Wednesday 10/11 at 11am Patient in agreement  Appropriate orders to be placed.   Electronically Signed: Pasty Spillers, PA 11/16/2021, 2:46 PM   I spent a total of 15 Minutes at the the patient's bedside AND on the patient's hospital floor or unit, greater than 50% of which was counseling/coordinating care for g-tube placement

## 2021-11-17 LAB — RENAL FUNCTION PANEL
Albumin: 2.6 g/dL — ABNORMAL LOW (ref 3.5–5.0)
Anion gap: 13 (ref 5–15)
BUN: 51 mg/dL — ABNORMAL HIGH (ref 8–23)
CO2: 26 mmol/L (ref 22–32)
Calcium: 9 mg/dL (ref 8.9–10.3)
Chloride: 96 mmol/L — ABNORMAL LOW (ref 98–111)
Creatinine, Ser: 4.22 mg/dL — ABNORMAL HIGH (ref 0.61–1.24)
GFR, Estimated: 15 mL/min — ABNORMAL LOW (ref >60)
Glucose, Bld: 128 mg/dL — ABNORMAL HIGH (ref 70–99)
Phosphorus: 3.8 mg/dL (ref 2.5–4.6)
Potassium: 4.6 mmol/L (ref 3.5–5.1)
Sodium: 135 mmol/L (ref 135–145)

## 2021-11-17 LAB — GLUCOSE, CAPILLARY
Glucose-Capillary: 102 mg/dL — ABNORMAL HIGH (ref 70–99)
Glucose-Capillary: 107 mg/dL — ABNORMAL HIGH (ref 70–99)
Glucose-Capillary: 133 mg/dL — ABNORMAL HIGH (ref 70–99)
Glucose-Capillary: 137 mg/dL — ABNORMAL HIGH (ref 70–99)
Glucose-Capillary: 75 mg/dL (ref 70–99)
Glucose-Capillary: 92 mg/dL (ref 70–99)

## 2021-11-17 LAB — MAGNESIUM: Magnesium: 1.7 mg/dL (ref 1.7–2.4)

## 2021-11-17 MED ORDER — CEFAZOLIN SODIUM-DEXTROSE 2-4 GM/100ML-% IV SOLN
2.0000 g | INTRAVENOUS | Status: AC
  Filled 2021-11-17: qty 100

## 2021-11-17 MED ORDER — CHLORHEXIDINE GLUCONATE CLOTH 2 % EX PADS
6.0000 | MEDICATED_PAD | Freq: Every day | CUTANEOUS | Status: DC
  Administered 2021-11-17 – 2021-12-03 (×17): 6 via TOPICAL

## 2021-11-17 NOTE — Progress Notes (Signed)
Patient ID: [name], male   DOB: 05/01/1960, 61 y.o.   MRN: [id number] [facility] Progress Note   Assessment/ Plan:   1.  End-stage renal disease: Anuric and with prolonged dialysis dependent acute kidney injury.  Etiology thought to be ischemic ATN in the setting of cardiogenic shock/cardiac arrest.  Started on CRRT 10/07/2021 and has recently been transitioned to intermittent hemodialysis via left IJ TDC.  Continue hemodialysis on TTS schedule with next dialysis planned for 11/19/2021 unless acute indications emerge.  Process underway for outpatient dialysis unit placement (to be additionally determined by his physical capabilities). 2.  History of cardiogenic shock/cardiac arrest: With previous cardiac tamponade/hemorrhagic pericarditis status post pericardial drain placement and requirement of ECMO.  Underwent mediastinal washout with sternal closure on 9/2 and continues to make gradual improvement. 3.  Anemia of critical illness with acute blood loss anemia: S/P IV Fe and on weekly aranesp. 4.  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 Nutrition: Unable to get PEG tube by IR under sedation; this will be done later under GA.  Ongoing tube feeds via NGT.  Subjective:   Underwent dialysis yesterday that was briefly interrupted by diarrhea.  He reports to have slept a little better last night but still overall fatigued.   Objective:   BP 96/80   Pulse 89   Temp (!) 97.3 F (36.3 C) (Oral)   Resp (!) 22   Ht 5\' 7"  (1.702 m)   Wt 90.1 kg   SpO2 97%   BMI 31.11 kg/m   Intake/Output Summary (Last 24 hours) at 11/17/2021 0752 Last data filed at 11/17/2021 0454 Gross per 24 hour  Intake 1554 ml  Output 1300 ml  Net 254 ml   Weight change: 1.3 kg  Physical Exam: Gen: Doubly resting in bed, getting blood sugar checked CVS: Pulse regular rhythm, normal rate, S1 and S2 sounds distant Resp: Clear to auscultation bilaterally, no distinct rales or  rhonchi Abd: Firm, protuberant, bowel sounds normal Ext: Trace lower extremity edema  Imaging: IR GASTROSTOMY TUBE MOD SED  Result Date: 11/15/2021 INDICATION: 61 year old male presents for image guided percutaneous gastrostomy EXAM: IMAGE GUIDED PERCUTANEOUS GASTROSTOMY DISCONTINUED GASTROSTOMY MEDICATIONS: None ANESTHESIA/SEDATION: Versed 1.0 mg IV; Fentanyl 50 mcg IV Moderate Sedation Time:  10 minutes The patient was continuously monitored during the procedure by the interventional radiology nurse under my direct supervision. CONTRAST:  0 FLUOROSCOPY: Radiation Exposure Index (as provided by the fluoroscopic device): 30 mGy Kerma COMPLICATIONS: None PROCEDURE: Informed written consent was obtained from the patient and the patient's family after a thorough discussion of the procedural risks, benefits and alternatives. All questions were addressed. Maximal Sterile Barrier Technique was utilized including caps, mask, sterile gowns, sterile gloves, sterile drape, hand hygiene and skin antiseptic. A timeout was performed prior to the initiation of the procedure. The patient was brought to the interventional suite. A time-out was performed, and moderate sedation was initiated, as the patient was uncomfortable with the initial orogastric tube placement. After the initial or gastric tube placement attempt, the patient withdrew his consent and did not want to proceed. After some discussion with the patient regarding using either or gastric tube placement or the existing nasogastric tube placement, it was clear he would not proceed and we discontinue the procedure. IMPRESSION: Discontinued attempt at image guided gastrostomy. Signed, Dulcy Fanny. Nadene Rubins, RPVI Vascular and Interventional Radiology Specialists [facility] Radiology PLAN: We will arrange anaesthesia support for image guided gastrostomy. Electronically Signed  By: Corrie Mckusick D.O.   On: 11/15/2021 16:15    Labs: BMET Recent Labs  Lab  11/10/21 0829 11/11/21 0553 11/12/21 0315 11/13/21 0311 11/14/21 0427 11/15/21 0623 11/16/21 0400  NA 137 136 135 135 131* 133* 132*  K 4.0 4.2 4.8 4.4 4.6 4.6 4.7  CL 94* 94* 94* 92* 91* 92* 93*  CO2 27 24 24 27 24 23 24   GLUCOSE 112* 124* 134* 91 114* 81 115*  BUN 51* 80* 103* 58* 87* 72* 88*  CREATININE 3.44* 4.75* 5.77* 3.81* 5.21* 4.09* 5.48*  CALCIUM 9.4 9.3 9.5 9.3 9.1 9.2 9.3  PHOS 3.1 2.8 2.8 3.6 4.2 3.2 4.7*   CBC Recent Labs  Lab 11/13/21 0311 11/14/21 0427 11/15/21 0623 11/16/21 0400  WBC 9.5 10.6* 9.5 8.5  HGB 9.0* 8.8* 9.2* 8.8*  HCT 31.0* 29.3* 30.3* 30.1*  MCV 97.8 95.1 95.3 95.6  PLT 418* 426* 425* 452*    Medications:     sodium chloride   Intravenous Once   ascorbic acid  250 mg Per Tube BID   aspirin  81 mg Per Tube Daily   Chlorhexidine Gluconate Cloth  6 each Topical Daily   darbepoetin (ARANESP) injection - DIALYSIS  100 mcg Intravenous Q Tue-HD   feeding supplement (PROSource TF20)  60 mL Per Tube TID   feeding supplement (VITAL 1.5 CAL)  1,000 mL Per Tube Q24H   fentaNYL  1 patch Transdermal Q72H   fiber supplement (BANATROL TF)  60 mL Per Tube QID   folic acid  1 mg Per Tube Daily   Gerhardt's butt cream   Topical Daily   heparin injection (subcutaneous)  5,000 Units Subcutaneous Q8H   insulin aspart  0-20 Units Subcutaneous Q4H   insulin aspart  2 Units Subcutaneous Q4H   insulin glargine-yfgn  10 Units Subcutaneous QHS   midodrine  20 mg Per Tube TID WC   multivitamin  1 tablet Per Tube QHS   nutrition supplement (JUVEN)  1 packet Per Tube BID BM   mouth rinse  15 mL Mouth Rinse 4 times per day   pantoprazole  40 mg Per Tube BID   QUEtiapine  50 mg Per Tube QHS   sodium chloride flush  3 mL Intravenous Q12H   thiamine  100 mg Per Tube Daily   Elmarie Shiley, MD 11/17/2021, 7:52 AM

## 2021-11-17 NOTE — Plan of Care (Signed)
Problem: Education: Goal: Will demonstrate proper wound care and an understanding of methods to prevent future damage Outcome: Progressing Goal: Knowledge of disease or condition will improve Outcome: Progressing Goal: Knowledge of the prescribed therapeutic regimen will improve Outcome: Progressing Goal: Individualized Educational Video(s) Outcome: Progressing   Problem: Activity: Goal: Risk for activity intolerance will decrease Outcome: Progressing   Problem: Cardiac: Goal: Will achieve and/or maintain hemodynamic stability Outcome: Progressing   Problem: Clinical Measurements: Goal: Postoperative complications will be avoided or minimized Outcome: Progressing   Problem: Respiratory: Goal: Respiratory status will improve Outcome: Progressing   Problem: Skin Integrity: Goal: Wound healing without signs and symptoms of infection Outcome: Progressing Goal: Risk for impaired skin integrity will decrease Outcome: Progressing   Problem: Urinary Elimination: Goal: Ability to achieve and maintain adequate renal perfusion and functioning will improve Outcome: Progressing   Problem: Education: Goal: Understanding of cardiac disease, CV risk reduction, and recovery process will improve Outcome: Progressing Goal: Individualized Educational Video(s) Outcome: Progressing   Problem: Activity: Goal: Ability to tolerate increased activity will improve Outcome: Progressing   Problem: Cardiac: Goal: Ability to achieve and maintain adequate cardiovascular perfusion will improve Outcome: Progressing   Problem: Health Behavior/Discharge Planning: Goal: Ability to safely manage health-related needs after discharge will improve Outcome: Progressing   Problem: Education: Goal: Knowledge of General Education information will improve Description: Including pain rating scale, medication(s)/side effects and non-pharmacologic comfort measures Outcome: Progressing   Problem: Health  Behavior/Discharge Planning: Goal: Ability to manage health-related needs will improve Outcome: Progressing   Problem: Clinical Measurements: Goal: Ability to maintain clinical measurements within normal limits will improve Outcome: Progressing Goal: Will remain free from infection Outcome: Progressing Goal: Diagnostic test results will improve Outcome: Progressing Goal: Respiratory complications will improve Outcome: Progressing Goal: Cardiovascular complication will be avoided Outcome: Progressing   Problem: Activity: Goal: Risk for activity intolerance will decrease Outcome: Progressing   Problem: Nutrition: Goal: Adequate nutrition will be maintained Outcome: Progressing   Problem: Coping: Goal: Level of anxiety will decrease Outcome: Progressing   Problem: Elimination: Goal: Will not experience complications related to bowel motility Outcome: Progressing Goal: Will not experience complications related to urinary retention Outcome: Progressing   Problem: Pain Managment: Goal: General experience of comfort will improve Outcome: Progressing   Problem: Safety: Goal: Ability to remain free from injury will improve Outcome: Progressing   Problem: Skin Integrity: Goal: Risk for impaired skin integrity will decrease Outcome: Progressing   Problem: Education: Goal: Ability to describe self-care measures that may prevent or decrease complications (Diabetes Survival Skills Education) will improve Outcome: Progressing Goal: Individualized Educational Video(s) Outcome: Progressing   Problem: Coping: Goal: Ability to adjust to condition or change in health will improve Outcome: Progressing   Problem: Fluid Volume: Goal: Ability to maintain a balanced intake and output will improve Outcome: Progressing   Problem: Health Behavior/Discharge Planning: Goal: Ability to identify and utilize available resources and services will improve Outcome: Progressing Goal:  Ability to manage health-related needs will improve Outcome: Progressing   Problem: Metabolic: Goal: Ability to maintain appropriate glucose levels will improve Outcome: Progressing   Problem: Nutritional: Goal: Maintenance of adequate nutrition will improve Outcome: Progressing Goal: Progress toward achieving an optimal weight will improve Outcome: Progressing   Problem: Skin Integrity: Goal: Risk for impaired skin integrity will decrease Outcome: Progressing   Problem: Tissue Perfusion: Goal: Adequacy of tissue perfusion will improve Outcome: Progressing   Problem: Education: Goal: Knowledge about tracheostomy care/management will improve Outcome: Progressing   Problem: Activity: Goal:  Ability to tolerate increased activity will improve Outcome: Progressing

## 2021-11-17 NOTE — Progress Notes (Addendum)
      [address]       [place] [address]             [phone]                 31 Days Post-Op Procedure(s) (LRB): REMOVAL OF IMPELLA LEFT VENTRICULAR ASSIST DEVICE (Right) TRANSESOPHAGEAL ECHOCARDIOGRAM (TEE) (N/A) PERCUTANEOUS TRACHEOSTOMY USING SHILEY FLEXIBLE 8 mm CUFFED TRACH. (N/A)   Events: No events _______________________________________________________________ Vitals: BP 112/64   Pulse 94   Temp (!) 97.3 F (36.3 C) (Oral)   Resp 17   Ht 5\' 7"  (1.702 m)   Wt 90.1 kg   SpO2 97%   BMI 31.11 kg/m  Filed Weights   11/16/21 0421 11/16/21 0745 11/17/21 0500  Weight: 89.4 kg 90.7 kg 90.1 kg     - Neuro: alert NAD  - Cardiovascular: sinus  Drips: none.      - Pulm: EWOB    ABG    Component Value Date/Time   PHART 7.301 (L) 10/22/2021 2354   PCO2ART 38.5 10/22/2021 2354   PO2ART 73 (L) 10/22/2021 2354   HCO3 19.3 (L) 10/22/2021 2354   TCO2 20 (L) 10/22/2021 2354   ACIDBASEDEF 7.0 (H) 10/22/2021 2354   O2SAT 71.7 11/02/2021 0439    - Abd: ND - Extremity: warm  .Intake/Output      10/07 0701 10/08 0700 10/08 0701 10/09 0700   I.V. (mL/kg)     Other     NG/GT 1554    Total Intake(mL/kg) 1554 (17.2)    Emesis/NG output     Other 1300    Stool 0    Total Output 1300    Net +254         Stool Occurrence 12 x 1 x      _______________________________________________________________ Labs:    Latest Ref Rng & Units 11/16/2021    4:00 AM 11/15/2021    6:23 AM 11/14/2021    4:27 AM  CBC  WBC 4.0 - 10.5 K/uL 8.5  9.5  10.6   Hemoglobin 13.0 - 17.0 g/dL 8.8  9.2  8.8   Hematocrit 39.0 - 52.0 % 30.1  30.3  29.3   Platelets 150 - 400 K/uL 452  425  426       Latest Ref Rng & Units 11/16/2021    4:00 AM 11/15/2021    6:23 AM 11/14/2021    4:27 AM  CMP  Glucose 70 - 99 mg/dL 115  81  114   BUN 8 - 23 mg/dL 88  72  87   Creatinine 0.61 - 1.24 mg/dL 5.48  4.09  5.21   Sodium 135 - 145 mmol/L 132  133  131   Potassium 3.5  - 5.1 mmol/L 4.7  4.6  4.6   Chloride 98 - 111 mmol/L 93  92  91   CO2 22 - 32 mmol/L 24  23  24    Calcium 8.9 - 10.3 mg/dL 9.3  9.2  9.1     CXR: -  _______________________________________________________________  Assessment and Plan:  s/p Redo-sternotomy, evacuation of hematoma, ECMO cannultion, and impella placement  Neuro: pain controlled.   CV: stable Pulm: stable Renal: HD yesterday GI: on tubefeed.  PEG tube later this week Heme: stable ID: afebrile Endo: SSI Dispo: floor today   [name] [name] 11/17/2021 10:02 AM

## 2021-11-18 DIAGNOSIS — Z7189 Other specified counseling: Secondary | ICD-10-CM

## 2021-11-18 LAB — GLUCOSE, CAPILLARY
Glucose-Capillary: 112 mg/dL — ABNORMAL HIGH (ref 70–99)
Glucose-Capillary: 118 mg/dL — ABNORMAL HIGH (ref 70–99)
Glucose-Capillary: 130 mg/dL — ABNORMAL HIGH (ref 70–99)
Glucose-Capillary: 132 mg/dL — ABNORMAL HIGH (ref 70–99)
Glucose-Capillary: 134 mg/dL — ABNORMAL HIGH (ref 70–99)
Glucose-Capillary: 140 mg/dL — ABNORMAL HIGH (ref 70–99)
Glucose-Capillary: 146 mg/dL — ABNORMAL HIGH (ref 70–99)

## 2021-11-18 LAB — RENAL FUNCTION PANEL
Albumin: 2.6 g/dL — ABNORMAL LOW (ref 3.5–5.0)
Anion gap: 15 (ref 5–15)
BUN: 76 mg/dL — ABNORMAL HIGH (ref 8–23)
CO2: 26 mmol/L (ref 22–32)
Calcium: 9.1 mg/dL (ref 8.9–10.3)
Chloride: 94 mmol/L — ABNORMAL LOW (ref 98–111)
Creatinine, Ser: 5.03 mg/dL — ABNORMAL HIGH (ref 0.61–1.24)
GFR, Estimated: 12 mL/min — ABNORMAL LOW (ref >60)
Glucose, Bld: 83 mg/dL (ref 70–99)
Phosphorus: 3.4 mg/dL (ref 2.5–4.6)
Potassium: 4.8 mmol/L (ref 3.5–5.1)
Sodium: 135 mmol/L (ref 135–145)

## 2021-11-18 LAB — MAGNESIUM: Magnesium: 1.8 mg/dL (ref 1.7–2.4)

## 2021-11-18 MED ORDER — ATORVASTATIN CALCIUM 80 MG PO TABS
80.0000 mg | ORAL_TABLET | Freq: Every day | ORAL | Status: DC
  Administered 2021-11-18 – 2021-11-22 (×3): 80 mg
  Filled 2021-11-18 (×3): qty 1

## 2021-11-18 MED ORDER — ATORVASTATIN CALCIUM 80 MG PO TABS: 80.0000 mg | ORAL_TABLET | Freq: Every day | ORAL | Status: DC

## 2021-11-18 NOTE — Progress Notes (Signed)
Patient insistent on side lying due to pain from wounds on coccyx. Wounds are not new - see wound consult note and wound care orders.  Specialty mattress ordered. Continue to monitor.

## 2021-11-18 NOTE — Progress Notes (Signed)
Mobility Specialist Progress Note    11/18/21 1645  Mobility  Activity Transferred to/from [facility]  Level of Assistance Moderate assist, patient does 50-74%  Assistive Device Other (Comment) (HHA)  Distance Ambulated (ft) 2 ft  Activity Response Tolerated fair  Mobility Referral Yes  $Mobility charge 1 Mobility   Left with call bell in reach. RN aware.  Hildred Alamin Mobility Specialist

## 2021-11-18 NOTE — Progress Notes (Signed)
Mobility Specialist Progress Note    11/18/21 1318  Mobility  Activity Transferred to/from [facility]  Level of Assistance Moderate assist, patient does 50-74%  Assistive Device Front wheel walker  Distance Ambulated (ft) 4 ft (2+2)  Activity Response Tolerated well  Mobility Referral Yes  $Mobility charge 1 Mobility   Pt received requesting to attempt BM. RN present for pericare. Left in bed with call bell in reach.   Hildred Alamin Mobility Specialist

## 2021-11-18 NOTE — Progress Notes (Signed)
Patient with 5 episodes of loose stool since 1900. Imodium given. Continue to monitor.

## 2021-11-18 NOTE — Progress Notes (Signed)
Patient ID: REFUGIO VANDEVOORDE, male   DOB: 03/05/1960, 61 y.o.   MRN: [id number]    Progress Note from the Palliative Medicine Team at [facility]   Patient Name: BLAS RICHES        Date: 11/18/2021 DOB: [birth year]  Age: 61 y.o. MRN#: [id number] Attending Physician: [name], MD Primary Care Physician: [name], MD Admit Date: 09/28/2021   Medical records reviewed, discussed with treatment team, assessed patient at bedside  61 y.o. 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 Bradycardic and hypoglycemic.   ECHO obtained which showed LVEF 50%, grade II diastolic dysfunction and moderate pericardial effusion with possible tamponade physiology.    CT Chest showed pericardial effusion and small bilateral pleural effusions. He was taken to the OR 8/19 for pericardial window.   Complicated hospitalization, today is day [facility] Events 8/19 admitted, s/p pericardial window 8/20 PEA cardiac arrest, left pigtail chest tube placement, PEA cardiac arrest due to tamponade, bedside sternotomy with pericardial drains placed. 8/21 return to OR for overnight bleeding. ECMO 8/22 minimal chest tube output.  Atrial fibrillation, controled with amiodarone.  8/22 TEE showed EF 35% at baseline, which improved significantly with decreasing ECMO flow.  8/23 tolerated diuresis  8/24 mediastinal washout, removal of hematoma 8/25 ongoing bleeding issues from posterior mediastinum 8/29 decannulated and off ECMO, 5.5 Impella placed, L femoral HD cath placed and started on CRRT 8/30 hypoxia and hypotension followed by brief arrest, gentle L lateral chest compressions performed and ROSC in roughly 1 minute. 8/31 remains deeply sedated on ventilator with open chest.  No acute events overnight 9/1 no acute events overnight, remains on ventilator deep sedation.   Tolerating aggressive volume removal per CRRT 9/2 sternotomy closure 9/4 FOB, low grade temps, notable thick mucus/frequent suctioning  - 9/3 Hypotensive overnight w/ SBPs in 80s. Febrile, mTemp 100.8. CRRT paused. VP increased to 0.04.  - 9/4 s/p bronchoscopy w/ BAL by PCCM, Cx NGTD  - 9/5 vomiting w/ large volume NGT output + watery/foul diarrhea, Tube feeds held. No signs of ileus on KUB. C-diff negative   - 9/7 OR for Impella Extraction and percutaneous tracheostomy. 2 u RBCs. - 9/8 CVVH stopped and line removed for holiday - 9/9 CVVH restarted.  -9/13 Worsening leukocytosis and pressor requirements. Purulent drainage from arterial line. Started Daptomycin, Micafungin and Meropenem. - 9/17 Daptomycin + Micafungin stopped. Remains on Meropenum  -9/19 CRRT holiday.  9-19 patient is demonstrating steps and progression, follows  commands, working with therapies, no urine output and worsening BUN and creatinine 9/20 more awake. CRRT off 9/21 cuffless tracheostomy. Do well with PMV 9/22 failed swallow evaluation 9/25 Tolerating trach capping on RA. Up to chair. Trach decannulation today. Tunneled dialysis catheter placement with IR. 9/26 Doing well post-trach decannulation.  S/p TDC with IR, some catheter flow issues overnight. Reattempting HD today. Tolerating HD-  TTS Working with speech, may need PEG for nutritional support, will need ongoing physical rehabilitation.   Initial palliative medicine consult completed on 09/30/2021.  Established decision makers at that time.  Colette Flaugher patient's,  legal wife is main contact and decision maker in the event patient does not have decision-making capacity for himself.  Patient and his wife are separated.    Patient/Granvil continues to improve cognitively, however I do not believe he has the ability to make  complex medical decisions for himself at this time. He understands that [name] is his medical decision-maker at this time and tells me "I am  making decisions through [facility]".    Attempted to explore Ladislao's understanding of his regarding current medical situation.  He verbalizes a limited understanding of his complicated hospital experience.  He recognizes the debilitated state that he is in.  He asks "when did I stop being myself".     Attempted to explore Danney's thoughts and feelings regarding current and future life prolonging medical interventions.  Today maitland lesiak desire for ongoing life prolonging measures specific to resuscitation and hemodialysis.  On further exploration of what patient values are in his sense of his  quality of life,   he is able to speak to the importance of being independent, the ability to walk again and he speaks specifically to playing golf.   He expresses frustration with his inability to do for himself.  [name] has made measurable progress since admission 52 days ago.  He remains high risk for decompensation.  It will be important to continue conversations regarding goals of care and treatment plan on a regular basis with [name] and his support/ people.  PMT will continue to support holistically  Discussed with Dorian Pod Thompson/spiritual care   [name]  Palliative Medicine Team Team Phone # [phone] Pager [phone]

## 2021-11-18 NOTE — Progress Notes (Signed)
Wound care: Patient's sacral area wound dressing changed. Wound bed looks clean and pink. There is some slight oozing in the area of the left bottom buttocks with thick purulent yellow/tan drainage. There is a foul odor. Cleansed, new dressing applied and intact.  Sternal wound and chest tube entry site dressings also changed. No odor noted.

## 2021-11-18 NOTE — Progress Notes (Signed)
Contacted [facility] admissions this morning to request update on financial clearance for out-pt HD once pt stable for d/c. Awaiting response.   Melven Sartorius Renal Navigator [phone]

## 2021-11-18 NOTE — Progress Notes (Signed)
Speech Language Pathology Treatment: Dysphagia  Patient Details Name: [name] MRN: [id number] DOB: 06-18-1960 Today's Date: 11/18/2021 Time: 1032-1100 SLP Time Calculation (min) (ACUTE ONLY): 28 min  Assessment / Plan / Recommendation Clinical Impression  Pt transferred out of ICU yesterday.  Today he was communicative; stated he was cold and in pain due to sacral wound.  Repositioned and provided with warm blanket. Set-up oral suctioning unit and provided pt with toothbrush/toothpaste. He provided his own oral care with supervision.  Accepted ice chips with thorough mastication and no overt s/s of aspiration. He needed min cues to engage in effortful swallow and CTAR.  He asked questions about when he could drink again - we discussed the plan for an MBS sometime this week. He verbalized frustration re: LOS.  Provided support and encouragement. [name] arrived from [place] and he was left to have a conversation with her. SLP will follow and plan for MBS as appropriate.   HPI [name], bradycardia and hypotension. Pt with bil pleural effusion and pericardial effusion s/p VATS with pericardial window 8/19. 8/20 cardiac tamponade with arrest,  intubated with bedside mediastinal exploration and ECMO via fem access. OR 8/21 for reexploration due to mediastinal hemorrhage. 8/28 OR for washout with ECMO decannulation and Impella placed. 8/29 brief PEA arrest with DCCV to NSR. 9/2 chest closure. 9/7 trach and Impella removed.9/11 VAC removed. Decannulated 9/26. PMHx: CAD s/p recent CABG x5 (admitted  8/3-11/23 with post op Afib), CKD stage II, DM, HTN, AKI, Afib and CVA      SLP Plan  Continue with current plan of care      Recommendations for follow up therapy are one component of a multi-disciplinary discharge planning process, led by the attending physician.  Recommendations may be updated based on patient status, additional functional criteria and  insurance authorization.    Recommendations  Diet recommendations: NPO;Other(comment) (may have a few ice chips after oral care) Medication Administration: Via alternative means                Oral Care Recommendations: Oral care QID Follow Up Recommendations: Skilled nursing-short term rehab (<3 hours/day) Assistance recommended at discharge: Frequent or constant Supervision/Assistance SLP Visit Diagnosis: Dysphagia, pharyngeal phase (R13.13) Plan: Continue with current plan of care         [name] L. Tivis Ringer, MA CCC/SLP Clinical Specialist - Acute Care SLP Acute Rehabilitation Services Office number [phone]   [name]  11/18/2021, 11:11 AM

## 2021-11-18 NOTE — Progress Notes (Addendum)
      [address]       [place] [address]             [phone]      32 Days Post-Op Procedure(s) (LRB): REMOVAL OF IMPELLA LEFT VENTRICULAR ASSIST DEVICE (Right) TRANSESOPHAGEAL ECHOCARDIOGRAM (TEE) (N/A) PERCUTANEOUS TRACHEOSTOMY USING SHILEY FLEXIBLE 8 mm CUFFED TRACH. (N/A)  Subjective:  Patient up using bedside commode.  Overall states he is okay.  Objective: Vital signs in last 24 hours: Temp:  [98.3 F (36.8 C)-98.4 F (36.9 C)] 98.4 F (36.9 C) (10/08 2021) Pulse Rate:  [83-182] 86 (10/08 2021) Cardiac Rhythm: Normal sinus rhythm (10/08 1900) Resp:  [17] 17 (10/08 2021) BP: (105-124)/(55-80) 116/64 (10/08 2021) SpO2:  [96 %-100 %] 98 % (10/08 2021)  Intake/Output from previous day: 10/08 0701 - 10/09 0700 In: 546 [NG/GT:546] Out: -   General appearance: alert, cooperative, and no distress Heart: regular rate and rhythm Lungs: clear to auscultation bilaterally Abdomen: soft, non-tender; bowel sounds normal; no masses,  no organomegaly Extremities: extremities normal, atraumatic, no cyanosis or edema Wound: open sternal wound, healthy pink wound bed, no evidence of infection.Marland Kitchen decubitus coccyx wound  Lab Results: Recent Labs    11/16/21 0400  WBC 8.5  HGB 8.8*  HCT 30.1*  PLT 452*   BMET:  Recent Labs    11/17/21 0936 11/18/21 0011  NA 135 135  K 4.6 4.8  CL 96* 94*  CO2 26 26  GLUCOSE 128* 83  BUN 51* 76*  CREATININE 4.22* 5.03*  CALCIUM 9.0 9.1    PT/INR: No results for input(s): "LABPROT", "INR" in the last 72 hours. ABG    Component Value Date/Time   PHART 7.301 (L) 10/22/2021 2354   HCO3 19.3 (L) 10/22/2021 2354   TCO2 20 (L) 10/22/2021 2354   ACIDBASEDEF 7.0 (H) 10/22/2021 2354   O2SAT 71.7 11/02/2021 0439   CBG (last 3)  Recent Labs    11/18/21 0022 11/18/21 0511 11/18/21 0746  GLUCAP 118* 132* 112*    Assessment/Plan: S/P Procedure(s) (LRB): REMOVAL OF IMPELLA LEFT VENTRICULAR ASSIST DEVICE  (Right) TRANSESOPHAGEAL ECHOCARDIOGRAM (TEE) (N/A) PERCUTANEOUS TRACHEOSTOMY USING SHILEY FLEXIBLE 8 mm CUFFED TRACH. (N/A)  CV- hemodynamically stable in NSR, BP labile 80-110s-remains on Midodrine 20 mg TID Pulm- status stable GI- persistent diarrhea due to tube feeds, imodium prn is ordered, IR attempted PEG tube last week, patient withdrew consent to attempt again this week Renal- ESRD on dialysis per Nephrology, financial/lack of situation is current barrier to outpatient dialysis CBGs stable Dispo- patient stable, multiple issues, could probably be discharged to [facility] or SNF.Marland Kitchen however lack of insurance is current barrier   LOS: 51 days    Ellwood Handler, PA-C 11/18/2021   Chart reviewed, patient examined, agree with above. Stable weekend Waiting for percutaneous feeding tube under anesthesia by IR later this week. Continue tube feeds, HD, dressing changes to wounds, PT.

## 2021-11-18 NOTE — Progress Notes (Signed)
Physical Therapy Treatment Patient Details Name: [name] MRN: [id number] DOB: [birth year] Today's Date: 11/18/2021   History of Present Illness Pt is a 61 y.o. male admitted 09/28/21 with SOB, bradycardia, hypotension. Workup for bilateral pleural effusion, pericardial effusion s/p VATS with pericardial window 8/19. Cardiac arrest with tamponade 8/20, intubated with bedside mediastinal exploration, ECMO via fem access. Pt with mediastinal hemorrhage s/p reexploration 8/21. To OR 8/28 for washout, ECMO decannulation, Impella placement. CRRT initiated 8/28. Brief PEA arrest 8/29 with DCCV to NSR.  S/p chest closure 9/2. S/p trach and impella removed 9/7. S/p wound vac removal 9/11. Course complicated by bouts of hypotension and bradycardia. Off CRRT 9/19. Trach decannulated 9/25. Tunned HD cath placement 9/25; iHD initiated. PMH includes CAD (s/p recent CABG 09/12/21), CKD2, DM, HTN, AKI, afib, CVA.    PT Comments    Pt sitting EOB on arrival and able to progress to limited gait with use of Eva walker and WC follow. Pt able to state sternal precautions and need for BM to get to bSC. Significant progress this session and will continue to work toward gait and improved function. RN aware of all.   HR 91-95    Recommendations for follow up therapy are one component of a multi-disciplinary discharge planning process, led by the attending physician.  Recommendations may be updated based on patient status, additional functional criteria and insurance authorization.  Follow Up Recommendations  Skilled nursing-short term rehab (<3 hours/day) Can patient physically be transported by private vehicle: No   Assistance Recommended at Discharge Frequent or constant Supervision/Assistance  Patient can return home with the following A lot of help with bathing/dressing/bathroom;A lot of help with walking and/or transfers;Direct supervision/assist for financial management;Help with stairs or ramp for  entrance;Direct supervision/assist for medications management;Assistance with cooking/housework;Assist for transportation   Equipment Recommendations  Wheelchair (measurements PT);Wheelchair cushion (measurements PT);Hospital bed    Recommendations for Other Services       Precautions / Restrictions Precautions Precautions: Sternal;Fall;Other (comment) Precaution Comments: cortrak, painful sacral/buttocks wound, sternal wound Restrictions Other Position/Activity Restrictions: initial CABG 09/12/21 with closure 9/2     Mobility  Bed Mobility               General bed mobility comments: pt sitting EOB on arrival    Transfers Overall transfer level: Needs assistance   Transfers: Sit to/from Stand Sit to Stand: Min assist, Mod assist, +2 safety/equipment           General transfer comment: min +2 to stand from bed and WC and step into EVA walker. min assist to sit with cues for hand placement. Mod assist to rise from [facility] with bil UE on therapist arms and mod assist for stand pivot BSC to [facility]    Ambulation/Gait Ambulation/Gait assistance: Min assist, +2 safety/equipment Gait Distance (Feet): 15 Feet Assistive device: Ethelene Hal Gait Pattern/deviations: Step-through pattern, Decreased stride length, Narrow base of support   Gait velocity interpretation: <1.8 ft/sec, indicate of risk for recurrent falls   General Gait Details: decreased BOS with short strides, reliance on UE support, min assist for balance and direction EVA walker with WC follow. Pt walked 15' and 7' with seated rest   Stairs             Wheelchair Mobility    Modified Rankin (Stroke Patients Only)       Balance Overall balance assessment: Needs assistance Sitting-balance support: Feet supported, No upper extremity supported Sitting balance-Leahy Scale: Fair Sitting balance - Comments: minguard  EOB   Standing balance support: Bilateral upper extremity supported Standing balance-Leahy  Scale: Poor Standing balance comment: bil UE support in standing                            Cognition Arousal/Alertness: Awake/alert Behavior During Therapy: Flat affect Overall Cognitive Status: Impaired/Different from baseline Area of Impairment: Memory, Attention, Safety/judgement, Awareness, Problem solving                   Current Attention Level: Selective Memory: Decreased short-term memory Following Commands: Follows one step commands consistently Safety/Judgement: Decreased awareness of safety, Decreased awareness of deficits   Problem Solving: Requires verbal cues, Slow processing          Exercises General Exercises - Lower Extremity Ankle Circles/Pumps: AROM, Both, 10 reps, Seated Long Arc Quad: AROM, Right, Seated, 10 reps Hip Flexion/Marching: Both, Seated, 10 reps, AROM    General Comments        Pertinent Vitals/Pain Pain Assessment Pain Assessment: No/denies pain Pain Score: 4  Pain Location: sacrum- itching Pain Intervention(s): Limited activity within patient's tolerance, Repositioned    Home Living                          Prior Function            PT Goals (current goals can now be found in the care plan section) Progress towards PT goals: Progressing toward goals    Frequency    Min 3X/week      PT Plan Current plan remains appropriate    Co-evaluation              AM-PAC PT "6 Clicks" Mobility   Outcome Measure  Help needed turning from your back to your side while in a flat bed without using bedrails?: A Lot Help needed moving from lying on your back to sitting on the side of a flat bed without using bedrails?: A Lot Help needed moving to and from a bed to a chair (including a wheelchair)?: A Lot Help needed standing up from a chair using your arms (e.g., wheelchair or bedside chair)?: A Lot Help needed to walk in hospital room?: A Lot Help needed climbing 3-5 steps with a railing? :  Total 6 Click Score: 11    End of Session Equipment Utilized During Treatment: Gait belt Activity Tolerance: Patient tolerated treatment well Patient left: in chair;with call bell/phone within reach;with chair alarm set Nurse Communication: Mobility status;Precautions PT Visit Diagnosis: Other abnormalities of gait and mobility (R26.89);Difficulty in walking, not elsewhere classified (R26.2);Muscle weakness (generalized) (M62.81)     Time: 5885-0277 PT Time Calculation (min) (ACUTE ONLY): 32 min  Charges:  $Gait Training: 8-22 mins $Therapeutic Exercise: 8-22 mins                     [name], PT Acute Rehabilitation Services Office: [place] 11/18/2021, 9:48 AM

## 2021-11-18 NOTE — Progress Notes (Signed)
Occupational Therapy Treatment Patient Details Name: [name] MRN: [id number] DOB: 04/21/1960 Today's Date: 11/18/2021   History of present illness Pt is a 61 y.o. male admitted 09/28/21 with SOB, bradycardia, hypotension. Workup for bilateral pleural effusion, pericardial effusion s/p VATS with pericardial window 8/19. Cardiac arrest with tamponade 8/20, intubated with bedside mediastinal exploration, ECMO via fem access. Pt with mediastinal hemorrhage s/p reexploration 8/21. To OR 8/28 for washout, ECMO decannulation, Impella placement. CRRT initiated 8/28. Brief PEA arrest 8/29 with DCCV to NSR.  S/p chest closure 9/2. S/p trach and impella removed 9/7. S/p wound vac removal 9/11. Course complicated by bouts of hypotension and bradycardia. Off CRRT 9/19. Trach decannulated 9/25. Tunned HD cath placement 9/25; iHD initiated. PMH includes CAD (s/p recent CABG 09/12/21), CKD2, DM, HTN, AKI, afib, CVA.   OT comments  Seyed is progressing well, sitting EOB with RN upon arrival. He required mod +2 for sit<>stand due to posterior bias and overcorrection, and min A +2 for 3x pivotal stepping with RW. He required max A for hygiene in standing. Pt motivated to sit in [facility], however once sitting and repositioning attempt, he was unable to tolerate sitting position. Transferred back to bed and lef tin spieling position. OT to continue to follow, POC remains appropriate.    Recommendations for follow up therapy are one component of a multi-disciplinary discharge planning process, led by the attending physician.  Recommendations may be updated based on patient status, additional functional criteria and insurance authorization.    Follow Up Recommendations  Skilled nursing-short term rehab (<3 hours/day)    Assistance Recommended at Discharge Frequent or constant Supervision/Assistance  Patient can return home with the following  A lot of help with walking and/or transfers;A lot of help with  bathing/dressing/bathroom;Assistance with feeding;Assistance with cooking/housework;Direct supervision/assist for medications management;Direct supervision/assist for financial management;Assist for transportation;Help with stairs or ramp for entrance   Equipment Recommendations  BSC/3in1       Precautions / Restrictions Precautions Precautions: Sternal;Fall;Other (comment) Precaution Booklet Issued: No Precaution Comments: cortrak, painful sacral/buttocks wound, sternal wound Restrictions Weight Bearing Restrictions: No Other Position/Activity Restrictions: initial CABG 09/12/21 with closure 9/2       Mobility Bed Mobility Overal bed mobility: Needs Assistance Bed Mobility: Sit to Sidelying         Sit to sidelying: Mod assist General bed mobility comments: mod A for BLEs    Transfers Overall transfer level: Needs assistance Equipment used: Rolling walker (2 wheels) Transfers: Sit to/from Stand, Bed to chair/wheelchair/BSC Sit to Stand: Mod assist, +2 physical assistance, +2 safety/equipment Stand pivot transfers: Min assist, +2 physical assistance, +2 safety/equipment         General transfer comment: mod A +2 for inital stand, posterior bias initally and then over corrected. min A +2 for pivotal stepping and 43ft ambulation with RW     Balance Overall balance assessment: Needs assistance Sitting-balance support: Feet supported Sitting balance-Leahy Scale: Fair     Standing balance support: Bilateral upper extremity supported Standing balance-Leahy Scale: Poor                             ADL either performed or assessed with clinical judgement   ADL Overall ADL's : Needs assistance/impaired Eating/Feeding: NPO                       Toilet Transfer: Moderate assistance;+2 for physical assistance;+2 for safety/equipment;BSC/3in1;Rolling walker (2 wheels)  Toileting- Clothing Manipulation and Hygiene: Maximal assistance;Sit to/from stand        Functional mobility during ADLs: Moderate assistance;+2 for physical assistance;+2 for safety/equipment General ADL Comments: min-mod cues to maintain sternal precautions. transferred to [facility] for BM, max A for hygiene in standing.    Extremity/Trunk Assessment Upper Extremity Assessment Upper Extremity Assessment: RUE deficits/detail RUE Deficits / Details: minimal digit flexion; pronation 3/5; supination 4/5; digit extension 2/5; thumb adduciton 0/5; thumb extension 4/5; genralzied weakness in shoulder and shoulder girdle/scapula. ape hand deformity. LUE Deficits / Details: generalized weakness; weaker proximally; able to achieve @ 90 degree FF and ABd; limited ER ; unabl eot reach back of head   Lower Extremity Assessment Lower Extremity Assessment: Defer to PT evaluation        Vision   Vision Assessment?: Vision impaired- to be further tested in functional context Additional Comments: visual attention seemed [facility] this date   Perception     Praxis      Cognition Arousal/Alertness: Awake/alert Behavior During Therapy: Flat affect Overall Cognitive Status: Impaired/Different from baseline Area of Impairment: Attention, Memory, Following commands, Safety/judgement, Awareness, Problem solving                   Current Attention Level: Selective Memory: Decreased short-term memory Following Commands: Follows one step commands consistently Safety/Judgement: Decreased awareness of safety, Decreased awareness of deficits Awareness: Emergent Problem Solving: Requires verbal cues, Slow processing General Comments: great improvement in cognition, able to accurately express needs. benefits from sternal precautions cues. slightly impuslive        Exercises      Shoulder Instructions       General Comments pain from cortrak and peri wounds this date. unable to tolerate sitting    Pertinent Vitals/ Pain       Pain Assessment Pain Assessment: Faces Faces Pain  Scale: Hurts whole lot Pain Location: perineal wounds Pain Descriptors / Indicators: Sore Pain Intervention(s): Limited activity within patient's tolerance, Monitored during session   Frequency  Min 2X/week        Progress Toward Goals  OT Goals(current goals can now be found in the care plan section)  Progress towards OT goals: Progressing toward goals  Acute Rehab OT Goals Patient Stated Goal: less pain OT Goal Formulation: With patient Time For Goal Achievement: 11/18/21 Potential to Achieve Goals: Good ADL Goals Pt Will Perform Grooming: with mod assist;bed level Pt Will Perform Upper Body Bathing: with mod assist;bed level Pt/caregiver will Perform Home Exercise Program: Increased strength;Increased ROM;Both right and left upper extremity;With minimal assist Additional ADL Goal #1: Pt will demosntrate sustained attention for grooming task in nondistracting environment Additional ADL Goal #2: Pt will complete bed mobility with mod A in preparation for ADL tasks  Plan Discharge plan remains appropriate       AM-PAC OT "6 Clicks" Daily Activity     Outcome Measure   Help from another person eating meals?: Total Help from another person taking care of personal grooming?: A Little Help from another person toileting, which includes using toliet, bedpan, or urinal?: A Lot Help from another person bathing (including washing, rinsing, drying)?: A Lot Help from another person to put on and taking off regular upper body clothing?: A Lot Help from another person to put on and taking off regular lower body clothing?: A Lot 6 Click Score: 12    End of Session Equipment Utilized During Treatment: Gait belt  OT Visit Diagnosis: Unsteadiness on feet (R26.81);Other abnormalities of gait  and mobility (R26.89);Muscle weakness (generalized) (M62.81);Other symptoms and signs involving cognitive function;Pain Pain - part of body: Hand   Activity Tolerance Patient limited by pain    Patient Left in bed;with call bell/phone within reach;with nursing/sitter in room;Other (comment)   Nurse Communication Mobility status        Time: 4591-3685 OT Time Calculation (min): 35 min  Charges: OT General Charges $OT Visit: 1 Visit OT Treatments $Self Care/Home Management : 23-37 mins   Elliot Cousin 11/18/2021, 3:48 PM

## 2021-11-18 NOTE — Progress Notes (Signed)
Patient ID: [name], male   DOB: 02/19/1960, 61 y.o.   MRN: [id number] S: No complaints, no events overnight. O:BP 124/74 (BP Location: Left Arm)   Pulse 90   Temp 97.6 F (36.4 C) (Oral)   Resp 17   Ht 5\' 7"  (1.702 m)   Wt 90.1 kg   SpO2 98%   BMI 31.11 kg/m   Intake/Output Summary (Last 24 hours) at 11/18/2021 1055 Last data filed at 11/17/2021 1400 Gross per 24 hour  Intake 546 ml  Output --  Net 546 ml   Intake/Output: I/O last 3 completed shifts: In: 2040 [NG/GT:2040] Out: -   Intake/Output this shift:  No intake/output data recorded. Weight change:  Gen: NAD CVS: RRR Resp:CTA Abd:+BS, soft, NT/ND Ext: no edema  Recent Labs  Lab 11/12/21 0315 11/13/21 0311 11/14/21 0427 11/15/21 0623 11/16/21 0400 11/17/21 0936 11/18/21 0011  NA 135 135 131* 133* 132* 135 135  K 4.8 4.4 4.6 4.6 4.7 4.6 4.8  CL 94* 92* 91* 92* 93* 96* 94*  CO2 24 27 24 23 24 26 26   GLUCOSE 134* 91 114* 81 115* 128* 83  BUN 103* 58* 87* 72* 88* 51* 76*  CREATININE 5.77* 3.81* 5.21* 4.09* 5.48* 4.22* 5.03*  ALBUMIN 2.5* 2.5* 2.5* 2.9* 2.6* 2.6* 2.6*  CALCIUM 9.5 9.3 9.1 9.2 9.3 9.0 9.1  PHOS 2.8 3.6 4.2 3.2 4.7* 3.8 3.4   Liver Function Tests: Recent Labs  Lab 11/16/21 0400 11/17/21 0936 11/18/21 0011  ALBUMIN 2.6* 2.6* 2.6*   No results for input(s): "LIPASE", "AMYLASE" in the last 168 hours. No results for input(s): "AMMONIA" in the last 168 hours. CBC: Recent Labs  Lab 11/12/21 0315 11/13/21 0311 11/14/21 0427 11/15/21 0623 11/16/21 0400  WBC 9.1 9.5 10.6* 9.5 8.5  HGB 8.3* 9.0* 8.8* 9.2* 8.8*  HCT 28.3* 31.0* 29.3* 30.3* 30.1*  MCV 99.0 97.8 95.1 95.3 95.6  PLT 432* 418* 426* 425* 452*   Cardiac Enzymes: No results for input(s): "CKTOTAL", "CKMB", "CKMBINDEX", "TROPONINI" in the last 168 hours. CBG: Recent Labs  Lab 11/17/21 1702 11/17/21 2202 11/18/21 0022 11/18/21 0511 11/18/21 0746  GLUCAP 75 137* 118* 132* 112*    Iron Studies: No results for  input(s): "IRON", "TIBC", "TRANSFERRIN", "FERRITIN" in the last 72 hours. Studies/Results: No results found.  sodium chloride   Intravenous Once   ascorbic acid  250 mg Per Tube BID   aspirin  81 mg Per Tube Daily   atorvastatin  80 mg Per Tube Daily   Chlorhexidine Gluconate Cloth  6 each Topical Daily   darbepoetin (ARANESP) injection - DIALYSIS  100 mcg Intravenous Q Tue-HD   feeding supplement (PROSource TF20)  60 mL Per Tube TID   feeding supplement (VITAL 1.5 CAL)  1,000 mL Per Tube Q24H   fentaNYL  1 patch Transdermal Q72H   fiber supplement (BANATROL TF)  60 mL Per Tube QID   folic acid  1 mg Per Tube Daily   Gerhardt's butt cream   Topical Daily   heparin injection (subcutaneous)  5,000 Units Subcutaneous Q8H   insulin aspart  0-20 Units Subcutaneous Q4H   insulin aspart  2 Units Subcutaneous Q4H   insulin glargine-yfgn  10 Units Subcutaneous QHS   midodrine  20 mg Per Tube TID WC   multivitamin  1 tablet Per Tube QHS   nutrition supplement (JUVEN)  1 packet Per Tube BID BM   mouth rinse  15 mL Mouth Rinse 4 times per day  pantoprazole  40 mg Per Tube BID   QUEtiapine  50 mg Per Tube QHS   sodium chloride flush  3 mL Intravenous Q12H   thiamine  100 mg Per Tube Daily    BMET    Component Value Date/Time   NA 135 11/18/2021 0011   NA 135 02/13/2021 0855   K 4.8 11/18/2021 0011   CL 94 (L) 11/18/2021 0011   CO2 26 11/18/2021 0011   GLUCOSE 83 11/18/2021 0011   BUN 76 (H) 11/18/2021 0011   BUN 9 02/13/2021 0855   CREATININE 5.03 (H) 11/18/2021 0011   CALCIUM 9.1 11/18/2021 0011   GFRNONAA 12 (L) 11/18/2021 0011   GFRAA >60 08/25/2019 2110   CBC    Component Value Date/Time   WBC 8.5 11/16/2021 0400   RBC 3.15 (L) 11/16/2021 0400   HGB 8.8 (L) 11/16/2021 0400   HGB 15.4 02/13/2021 0855   HCT 30.1 (L) 11/16/2021 0400   HCT 45.7 02/13/2021 0855   PLT 452 (H) 11/16/2021 0400   PLT 239 02/13/2021 0855   MCV 95.6 11/16/2021 0400   MCV 88 02/13/2021 0855    MCH 27.9 11/16/2021 0400   MCHC 29.2 (L) 11/16/2021 0400   RDW 17.7 (H) 11/16/2021 0400   RDW 11.3 (L) 02/13/2021 0855   LYMPHSABS 2.2 09/29/2021 1956   MONOABS 2.1 (H) 09/29/2021 1956   EOSABS 0.0 09/29/2021 1956   BASOSABS 0.0 09/29/2021 1956     Assessment/Plan:  ESRD - following ischemic ATN in setting of cardiogenic shock/cardiac arrest.  Started CRRT on 10/07/21 and transitioned to IHD on 10/31/21.  Currently on TTS schedule.  Outpatient dialysis arrangements pending [facility] financial clearance.   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  H/o ECMO. Anemia of critical illness with ABLA - on ESA and IV iron CAD - s/p CABG x 5 vessels on 09/12/21 F/E/N - awaiting PEG placement under GA.  Now with NGT.  Donetta Potts, MD [facility]

## 2021-11-18 NOTE — Progress Notes (Addendum)
Patient ID: [name], male   DOB: Dec 22, 1960, 61 y.o.   MRN: [id number]   Advanced Heart Failure Rounding Note   Subjective:    - 8/19 Pericardial window - 8/20 Cardiac arrest with tamponade -> Emergent bedside washout - 09/29/21 VA Cannulation - 09/30/21 Return to OR for mediastinal hemorrhage - 10/01/21 Developed AF -> amio - 10/02/21 TEE EF 25-30%  - 10/04/21 OR for washout. C/b continued bleeding overnight - 10/07/21 Placement of Impella 5.5 with washout, VA ECMO decannulation. Hypotensive with development of severe RV dysfunction after ECMO off and pressors titrated up. Multiple units of blood products. - 10/08/21 Brief PEA arrest. AFL with RVR >> S/p DCCV to SR, back in AFL shortly after - 8/31 Give 1UPRBCs  - 9/2 OR for chest closure - 9/3 Hypotensive overnight w/ SBPs in 80s. Febrile, mTemp 100.8. CRRT paused. VP increased to 0.04.  - 9/4 s/p bronchoscopy w/ BAL by PCCM, Cx NGTD  - 9/5 vomiting w/ large volume NGT output + watery/foul diarrhea, Tube feeds held. No signs of ileus on KUB. C-diff negative   - 9/7 OR for Impella Extraction and percutaneous tracheostomy. 2 u RBCs. - 9/8 CVVH stopped and line removed for holiday - 9/9 CVVH restarted.  -9/13 Worsening leukocytosis and pressor requirements. Purulent drainage from arterial line. Started Daptomycin, Micafungin and Meropenem. - 9/17 Daptomycin + Micafungin stopped. Remains on Meropenum  -9/19 CRRT holiday.  -9/20 Trach downsized  -9/21 transitioned to iHD  - 9/25 Tunneled HD cath placed. Trach decannulation - 9/26 Hgb 6.1>>transfused 2uRBC   Lots of diarrhea overnight. Imodium has helped.  Had failed g-tube placement with mod sedation 10/6, plan for Gtube 10/11 under GA.   Denies CP and SOB. With pain in sacral area from wounds.    Objective:     Vital Signs:   Temp:  [98.3 F (36.8 C)-98.4 F (36.9 C)] 98.4 F (36.9 C) (10/08 2021) Pulse Rate:  [83-182] 92 (10/09 0806) Resp:  [17] 17 (10/08 2021) BP:  (105-124)/(55-80) 124/74 (10/09 0806) SpO2:  [96 %-100 %] 98 % (10/08 2021) Last BM Date : 11/18/21  Weight change: Filed Weights   11/16/21 0421 11/16/21 0745 11/17/21 0500  Weight: 89.4 kg 90.7 kg 90.1 kg    Intake/Output:   Intake/Output Summary (Last 24 hours) at 11/18/2021 0809 Last data filed at 11/17/2021 1400 Gross per 24 hour  Intake 546 ml  Output --  Net 546 ml    General:  chronically ill appearing.  No respiratory difficulty HEENT: normal, + cortrak Neck: supple. No JVD. Carotids 2+ bilat; no bruits. No lymphadenopathy or thyromegaly appreciated.  Cor: PMI nondisplaced. Regular rate & rhythm. No rubs, gallops or murmurs. iHD cath Lungs: clear Abdomen: soft, nontender, nondistended. No hepatosplenomegaly. No bruits or masses. Hyperactive bowel sounds. Extremities: no cyanosis, clubbing, rash, edema  Neuro: alert & oriented x 3, cranial nerves grossly intact. moves all 4 extremities w/o difficulty. Affect pleasant.   Telemetry: NSR 80s (Personally reviewed)    Labs: Basic Metabolic Panel: Recent Labs  Lab 11/14/21 0427 11/15/21 0623 11/16/21 0400 11/17/21 0936 11/18/21 0011  NA 131* 133* 132* 135 135  K 4.6 4.6 4.7 4.6 4.8  CL 91* 92* 93* 96* 94*  CO2 _0 GLUCOSE 114* 81 115* 128* 83  BUN 87* 72* 88* 51* 76*  CREATININE 5.21* 4.09* 5.48* 4.22* 5.03*  CALCIUM 9.1 9.2 9.3 9.0 9.1  MG 1.9 1.7 1.9 1.7 1.8  PHOS 4.2 3.2 4.7*  3.8 3.4    Liver Function Tests: Recent Labs  Lab 11/14/21 0427 11/15/21 0623 11/16/21 0400 11/17/21 0936 11/18/21 0011  ALBUMIN 2.5* 2.9* 2.6* 2.6* 2.6*   No results for input(s): "LIPASE", "AMYLASE" in the last 168 hours. No results for input(s): "AMMONIA" in the last 168 hours.  CBC: Recent Labs  Lab 11/12/21 0315 11/13/21 0311 11/14/21 0427 11/15/21 0623 11/16/21 0400  WBC 9.1 9.5 10.6* 9.5 8.5  HGB 8.3* 9.0* 8.8* 9.2* 8.8*  HCT 28.3* 31.0* 29.3* 30.3* 30.1*  MCV 99.0 97.8 95.1 95.3 95.6  PLT 432*  418* 426* 425* 452*    Cardiac Enzymes: No results for input(s): "CKTOTAL", "CKMB", "CKMBINDEX", "TROPONINI" in the last 168 hours.   BNP: BNP (last 3 results) Recent Labs    12/24/20 0651 09/28/21 0405  BNP 412.0* 826.6*    ProBNP (last 3 results) No results for input(s): "PROBNP" in the last 8760 hours.    Other results:  Imaging: No results found.   Medications:     Scheduled Medications:  sodium chloride   Intravenous Once   ascorbic acid  250 mg Per Tube BID   aspirin  81 mg Per Tube Daily   Chlorhexidine Gluconate Cloth  6 each Topical Daily   darbepoetin (ARANESP) injection - DIALYSIS  100 mcg Intravenous Q Tue-HD   feeding supplement (PROSource TF20)  60 mL Per Tube TID   feeding supplement (VITAL 1.5 CAL)  1,000 mL Per Tube Q24H   fentaNYL  1 patch Transdermal Q72H   fiber supplement (BANATROL TF)  60 mL Per Tube QID   folic acid  1 mg Per Tube Daily   Gerhardt's butt cream   Topical Daily   heparin injection (subcutaneous)  5,000 Units Subcutaneous Q8H   insulin aspart  0-20 Units Subcutaneous Q4H   insulin aspart  2 Units Subcutaneous Q4H   insulin glargine-yfgn  10 Units Subcutaneous QHS   midodrine  20 mg Per Tube TID WC   multivitamin  1 tablet Per Tube QHS   nutrition supplement (JUVEN)  1 packet Per Tube BID BM   mouth rinse  15 mL Mouth Rinse 4 times per day   pantoprazole  40 mg Per Tube BID   QUEtiapine  50 mg Per Tube QHS   sodium chloride flush  3 mL Intravenous Q12H   thiamine  100 mg Per Tube Daily    Infusions:  sodium chloride Stopped (11/09/21 0715)   sodium chloride Stopped (11/04/21 1021)   albumin human 12.5 g (11/01/21 2248)   [START ON 11/20/2021]  ceFAZolin (ANCEF) IV      PRN Medications: sodium chloride, acetaminophen (TYLENOL) oral liquid 160 mg/5 mL, albumin human, camphor-menthol, clonazePAM, dextrose, diphenhydrAMINE, HYDROmorphone (DILAUDID) injection, iohexol, ipratropium-albuterol, lidocaine, loperamide HCl,  ondansetron (ZOFRAN) IV, mouth rinse, [EXPIRED] oxyCODONE **FOLLOWED BY** oxyCODONE, polyvinyl alcohol, sodium chloride flush, white petrolatum   Assessment/Plan:    1. Shock - mixed cardiogenic/hemorrhagic -> VA ECMO -> decannulated on 8/28 to Impella 5.5 - Echo 08/28: Underfilled LV, EF 55-60% with severe LVH and near normal RV systsystolic function.  - Impella extracted 9/7 - Bedside echo 09/13 - LV function preserved, RV mildly reduced. Small clot in posterior pericardium but no hemodynamic effect  - Completed total of 14 days of Micafungin (ended 9/27) - Off pressors.  - On midodrine 20 mg tid. BP stable. No change  2. Cardiac arrest (PEA/bradycardic) - 8/19 and 8/28 in setting of tamponade - resolved  3. Cardiac tamponade with emergent bedside  sternotomy  - Diffuse epicardial bleeding with post-op Dresslers - return to OR 8/21 and 8/24 for washouts.  - Washout 8/28 in OR. Multiple units of blood products in OR.   - Chest closed on 9/2 - Hgb stable 8.8  4. Acute hypoxemic respiratory failure - Off ECMO.  - Perc Trach placed 9/7  - s/p bronchoscopy w/ BAL 9/4.  - tracheal aspirate 09/13 growing few GPC and rare yeast  - Started back on IV abx as above d/t concern for sepsis.  - Trach downsized - Trach de cannulated 9/25  - Resolved. On room air.   5. AKI due to ATN - CRRT started 08/28.  - Remains on anuric - Transitioned to [facility] 9/21. Nephrology following  - s/p [facility] cath by IR   - tolerating iHD (TTS schedule). 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 Now off colchicine   7. 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 Continue ASA 81 mg daily  8. DM2 - continue SSI   9. PAF/AFL - Tolerates poorly.  - Recurrent AFL. S/p DCCV to SR 08/29.  - Off amio d/t bradycardia - Not on AC for now d/t bleeding, DVT dose heparin.  - In NSR today  10. ID - Completed vancomycin/meropenem with open chest.  -  Possible aspiration with  vomiting.  - now off meropenum   - WBC down to 8.5   11. FEN - TFs ongoing  - SLP following   12. Hyperkalemia - resolved - getting iHD   13. Unstageable Pressure Ulcer, Buttock  - WOC following.  - Improving  14. Fall - management per primary team  15. Diarrhea - mgm per primary - Imodium seemed to help  [name], AGACNP-BC  8:09 AM  Patient seen and examined with the above-signed Advanced Practice Provider and/or Housestaff. I personally reviewed laboratory data, imaging studies and relevant notes. I independently examined the patient and formulated the important aspects of the plan. I have edited the note to reflect any of my changes or salient points. I have personally discussed the plan with the patient and/or family.  Was able to walk with PT today. Still no grip strength in RUE. Diarrhea improving. SBP 110-120 on midodrine 20 tid. Thirsty. Wants something to drink.   General:  Weak appearing. No resp difficulty HEENT: normal + Cor-trak Neck: supple. no JVD. Carotids 2+ bilat; no bruits. No lymphadenopathy or thryomegaly appreciated. Cor: PMI nondisplaced. Regular rate & rhythm. No rubs, gallops or murmurs. Lungs: clear Abdomen: soft, nontender, nondistended. No hepatosplenomegaly. No bruits or masses. Good bowel sounds. Extremities: no cyanosis, clubbing, rash, edema Neuro: alert & orientedx3, cranial nerves grossly intact. RUE weak   Stable from cardiac perspective. May be able to begin weaning midodrine soon and use mainly on HD days. For G-tube this week. Continue PT/OT. Getting RUE splint made.   Mark Bickers, MD  9:51 AM

## 2021-11-19 LAB — CBC
HCT: 29.4 % — ABNORMAL LOW (ref 39.0–52.0)
Hemoglobin: 9 g/dL — ABNORMAL LOW (ref 13.0–17.0)
MCH: 28.3 pg (ref 26.0–34.0)
MCHC: 30.6 g/dL (ref 30.0–36.0)
MCV: 92.5 fL (ref 80.0–100.0)
Platelets: 434 10*3/uL — ABNORMAL HIGH (ref 150–400)
RBC: 3.18 MIL/uL — ABNORMAL LOW (ref 4.22–5.81)
RDW: 17.4 % — ABNORMAL HIGH (ref 11.5–15.5)
WBC: 9 10*3/uL (ref 4.0–10.5)
nRBC: 0.2 % (ref 0.0–0.2)

## 2021-11-19 LAB — GLUCOSE, CAPILLARY
Glucose-Capillary: 97 mg/dL (ref 70–99)
Glucose-Capillary: 123 mg/dL — ABNORMAL HIGH (ref 70–99)
Glucose-Capillary: 136 mg/dL — ABNORMAL HIGH (ref 70–99)
Glucose-Capillary: 108 mg/dL — ABNORMAL HIGH (ref 70–99)
Glucose-Capillary: 88 mg/dL (ref 70–99)

## 2021-11-19 LAB — RENAL FUNCTION PANEL
Albumin: 2.6 g/dL — ABNORMAL LOW (ref 3.5–5.0)
Anion gap: 15 (ref 5–15)
BUN: 99 mg/dL — ABNORMAL HIGH (ref 8–23)
CO2: 24 mmol/L (ref 22–32)
Calcium: 9.2 mg/dL (ref 8.9–10.3)
Chloride: 96 mmol/L — ABNORMAL LOW (ref 98–111)
Creatinine, Ser: 6.65 mg/dL — ABNORMAL HIGH (ref 0.61–1.24)
GFR, Estimated: 9 mL/min — ABNORMAL LOW (ref >60)
Glucose, Bld: 114 mg/dL — ABNORMAL HIGH (ref 70–99)
Phosphorus: 4.1 mg/dL (ref 2.5–4.6)
Potassium: 5 mmol/L (ref 3.5–5.1)
Sodium: 135 mmol/L (ref 135–145)

## 2021-11-19 LAB — MAGNESIUM: Magnesium: 2 mg/dL (ref 1.7–2.4)

## 2021-11-19 LAB — PROTIME-INR
INR: 1.1 (ref 0.8–1.2)
Prothrombin Time: 13.7 seconds (ref 11.4–15.2)

## 2021-11-19 MED ORDER — MIDODRINE HCL 5 MG PO TABS
15.0000 mg | ORAL_TABLET | Freq: Three times a day (TID) | ORAL | Status: DC
  Administered 2021-11-20: 15 mg
  Filled 2021-11-19 (×3): qty 3

## 2021-11-19 MED ORDER — HEPARIN SODIUM (PORCINE) 1000 UNIT/ML DIALYSIS
20.0000 [IU]/kg | INTRAMUSCULAR | Status: DC | PRN
  Administered 2021-11-19: 1800 [IU] via INTRAVENOUS_CENTRAL
  Filled 2021-11-19 (×2): qty 2

## 2021-11-19 MED ORDER — HEPARIN SODIUM (PORCINE) 1000 UNIT/ML IJ SOLN
INTRAMUSCULAR | Status: AC
  Filled 2021-11-19: qty 4

## 2021-11-19 MED ORDER — HEPARIN SODIUM (PORCINE) 1000 UNIT/ML IJ SOLN
INTRAMUSCULAR | Status: AC
  Filled 2021-11-19: qty 2

## 2021-11-19 MED ORDER — MEDIHONEY WOUND/BURN DRESSING EX PSTE
1.0000 | PASTE | Freq: Every day | CUTANEOUS | Status: DC
  Administered 2021-11-20 – 2021-12-03 (×15): 1 via TOPICAL
  Filled 2021-11-19 (×3): qty 44

## 2021-11-19 NOTE — Plan of Care (Signed)
  Problem: Education: Goal: Knowledge of disease or condition will improve Outcome: Progressing Goal: Knowledge of the prescribed therapeutic regimen will improve Outcome: Progressing   Problem: Activity: Goal: Risk for activity intolerance will decrease Outcome: Progressing   Problem: Education: Goal: Knowledge of disease or condition will improve Outcome: Progressing Goal: Knowledge of the prescribed therapeutic regimen will improve Outcome: Progressing   Problem: Activity: Goal: Risk for activity intolerance will decrease Outcome: Progressing   Problem: Cardiac: Goal: Will achieve and/or maintain hemodynamic stability Outcome: Progressing   Problem: Clinical Measurements: Goal: Postoperative complications will be avoided or minimized Outcome: Progressing   Problem: Respiratory: Goal: Respiratory status will improve Outcome: Progressing   Problem: Skin Integrity: Goal: Wound healing without signs and symptoms of infection Outcome: Progressing

## 2021-11-19 NOTE — Progress Notes (Signed)
SLP Cancellation Note  Patient Details Name: [name] MRN: [id number] DOB: 09-Jan-1961   Cancelled treatment:       Reason Eval/Treat Not Completed: Patient at procedure or test/unavailable (pt leaving the floor for HD). Will f/u as able.     Osie Bond., M.A. [place] Office [phone]  Secure chat preferred  11/19/2021, 9:06 AM

## 2021-11-19 NOTE — Consult Note (Signed)
[facility] Nurse wound follow up Unstageable pressure injury extends across bilat buttocks/inner gluteal fold/sacrum; 14X11cm.  50% yellow slough near the top half, 50% red and moist at the bottom half .  No further blue green drainage as noted last week.  Mod amt tan drainage, no odor.  Present on admission: No Dressing procedure/placement/frequency: Discontinue Dakins solution and begin Medihoney to assist with removal of nonviable tissue. Topical treatment orders provided for bedside nurses to perform Q day as follows: Apply Medihoney to sacrum/bilat buttock wound Q day, then cover with gauze and abd pad and tape. [facility] team will reassess Q week to determine if a change in the plan of care is indicated at that time. Please re-consult if further assistance is needed.  Thank-you,  Julien Girt MSN, [place], [place], [place], [place]

## 2021-11-19 NOTE — Progress Notes (Signed)
Mobility Specialist Progress Note    11/19/21 1338  Mobility  Activity Off unit   Pt at HD. Will f/u as schedule permits.   Hildred Alamin Mobility Specialist

## 2021-11-19 NOTE — Progress Notes (Addendum)
Patient ID: [name], male   DOB: 1960-12-01, 61 y.o.   MRN: [id number]   Advanced Heart Failure Rounding Note   Subjective:    - 8/19 Pericardial window - 8/20 Cardiac arrest with tamponade -> Emergent bedside washout - 09/29/21 VA Cannulation - 09/30/21 Return to OR for mediastinal hemorrhage - 10/01/21 Developed AF -> amio - 10/02/21 TEE EF 25-30%  - 10/04/21 OR for washout. C/b continued bleeding overnight - 10/07/21 Placement of Impella 5.5 with washout, VA ECMO decannulation. Hypotensive with development of severe RV dysfunction after ECMO off and pressors titrated up. Multiple units of blood products. - 10/08/21 Brief PEA arrest. AFL with RVR >> S/p DCCV to SR, back in AFL shortly after - 8/31 Give 1UPRBCs  - 9/2 OR for chest closure - 9/3 Hypotensive overnight w/ SBPs in 80s. Febrile, mTemp 100.8. CRRT paused. VP increased to 0.04.  - 9/4 s/p bronchoscopy w/ BAL by PCCM, Cx NGTD  - 9/5 vomiting w/ large volume NGT output + watery/foul diarrhea, Tube feeds held. No signs of ileus on KUB. C-diff negative   - 9/7 OR for Impella Extraction and percutaneous tracheostomy. 2 u RBCs. - 9/8 CVVH stopped and line removed for holiday - 9/9 CVVH restarted.  -9/13 Worsening leukocytosis and pressor requirements. Purulent drainage from arterial line. Started Daptomycin, Micafungin and Meropenem. - 9/17 Daptomycin + Micafungin stopped. Remains on Meropenum  -9/19 CRRT holiday.  -9/20 Trach downsized  -9/21 transitioned to iHD  - 9/25 Tunneled HD cath placed. Trach decannulation - 9/26 Hgb 6.1>>transfused 2uRBC   Had failed g-tube placement with mod sedation 10/6, plan for Gtube 10/11 under [facility].   Pt going for iHD today  Still experiencing diarrhea. Better but still bothersome. Denies CP and SOB. Pain in sacral area from wounds. Repositioned in bed.  Objective:     Vital Signs:   Temp:  [97.8 F (36.6 C)-98.8 F (37.1 C)] 97.9 F (36.6 C) (10/10 0720) Pulse Rate:  [86-91] 88 (10/10  0720) Resp:  [18-26] 26 (10/10 0720) BP: (92-121)/(57-76) 92/58 (10/10 0720) SpO2:  [94 %-98 %] 94 % (10/10 0720) Weight:  [89.8 kg] 89.8 kg (10/10 0300) Last BM Date : 11/18/21  Weight change: Filed Weights   11/16/21 0745 11/17/21 0500 11/19/21 0300  Weight: 90.7 kg 90.1 kg 89.8 kg    Intake/Output:   Intake/Output Summary (Last 24 hours) at 11/19/2021 0834 Last data filed at 11/19/2021 0441 Gross per 24 hour  Intake 2321 ml  Output --  Net 2321 ml    General:  chronically ill appearing.  No respiratory difficulty HEENT: normal, + cortrak Neck: supple. No JVD. Carotids 2+ bilat; no bruits. No lymphadenopathy or thyromegaly appreciated.  Cor: PMI nondisplaced. Regular rate & rhythm. No rubs, gallops or murmurs. iHD cath Lungs: clear Abdomen: soft, nontender, nondistended. No hepatosplenomegaly. No bruits or masses. Active bowel sounds. Extremities: no cyanosis, clubbing, rash, edema. RUE weakness Neuro: alert & oriented x 3, cranial nerves grossly intact. moves all 4 extremities w/o difficulty. Affect pleasant.   Telemetry: NSR 80s (Personally reviewed)    Labs: Basic Metabolic Panel: Recent Labs  Lab 11/15/21 0623 11/16/21 0400 11/17/21 0936 11/18/21 0011 11/19/21 0646  NA 133* 132* 135 135 135  K 4.6 4.7 4.6 4.8 5.0  CL 92* 93* 96* 94* 96*  CO2 _0 GLUCOSE 81 115* 128* 83 114*  BUN 72* 88* 51* 76* 99*  CREATININE 4.09* 5.48* 4.22* 5.03* 6.65*  CALCIUM 9.2 9.3 9.0  9.1 9.2  MG 1.7 1.9 1.7 1.8 2.0  PHOS 3.2 4.7* 3.8 3.4 4.1    Liver Function Tests: Recent Labs  Lab 11/15/21 0623 11/16/21 0400 11/17/21 0936 11/18/21 0011 11/19/21 0646  ALBUMIN 2.9* 2.6* 2.6* 2.6* 2.6*   No results for input(s): "LIPASE", "AMYLASE" in the last 168 hours. No results for input(s): "AMMONIA" in the last 168 hours.  CBC: Recent Labs  Lab 11/13/21 0311 11/14/21 0427 11/15/21 0623 11/16/21 0400 11/19/21 0646  WBC 9.5 10.6* 9.5 8.5 9.0  HGB 9.0* 8.8*  9.2* 8.8* 9.0*  HCT 31.0* 29.3* 30.3* 30.1* 29.4*  MCV 97.8 95.1 95.3 95.6 92.5  PLT 418* 426* 425* 452* 434*    Cardiac Enzymes: No results for input(s): "CKTOTAL", "CKMB", "CKMBINDEX", "TROPONINI" in the last 168 hours.   BNP: BNP (last 3 results) Recent Labs    12/24/20 0651 09/28/21 0405  BNP 412.0* 826.6*    ProBNP (last 3 results) No results for input(s): "PROBNP" in the last 8760 hours.    Other results:  Imaging: No results found.   Medications:     Scheduled Medications:  sodium chloride   Intravenous Once   ascorbic acid  250 mg Per Tube BID   aspirin  81 mg Per Tube Daily   atorvastatin  80 mg Per Tube Daily   Chlorhexidine Gluconate Cloth  6 each Topical Daily   darbepoetin (ARANESP) injection - DIALYSIS  100 mcg Intravenous Q Tue-HD   feeding supplement (PROSource TF20)  60 mL Per Tube TID   feeding supplement (VITAL 1.5 CAL)  1,000 mL Per Tube Q24H   fentaNYL  1 patch Transdermal Q72H   fiber supplement (BANATROL TF)  60 mL Per Tube QID   folic acid  1 mg Per Tube Daily   Gerhardt's butt cream   Topical Daily   heparin injection (subcutaneous)  5,000 Units Subcutaneous Q8H   insulin aspart  0-20 Units Subcutaneous Q4H   insulin aspart  2 Units Subcutaneous Q4H   insulin glargine-yfgn  10 Units Subcutaneous QHS   midodrine  20 mg Per Tube TID WC   multivitamin  1 tablet Per Tube QHS   nutrition supplement (JUVEN)  1 packet Per Tube BID BM   mouth rinse  15 mL Mouth Rinse 4 times per day   pantoprazole  40 mg Per Tube BID   QUEtiapine  50 mg Per Tube QHS   sodium chloride flush  3 mL Intravenous Q12H   thiamine  100 mg Per Tube Daily    Infusions:  sodium chloride Stopped (11/09/21 0715)   sodium chloride Stopped (11/04/21 1021)   albumin human 12.5 g (11/01/21 2248)   [START ON 11/20/2021]  ceFAZolin (ANCEF) IV      PRN Medications: sodium chloride, acetaminophen (TYLENOL) oral liquid 160 mg/5 mL, albumin human, camphor-menthol,  clonazePAM, dextrose, diphenhydrAMINE, HYDROmorphone (DILAUDID) injection, iohexol, ipratropium-albuterol, lidocaine, loperamide HCl, ondansetron (ZOFRAN) IV, mouth rinse, [EXPIRED] oxyCODONE **FOLLOWED BY** oxyCODONE, polyvinyl alcohol, sodium chloride flush, white petrolatum   Assessment/Plan:    1. Shock - mixed cardiogenic/hemorrhagic -> VA ECMO -> decannulated on 8/28 to Impella 5.5 - Echo 08/28: Underfilled LV, EF 55-60% with severe LVH and near normal RV systsystolic function.  - Impella extracted 9/7 - Bedside echo 09/13 - LV function preserved, RV mildly reduced. Small clot in posterior pericardium but no hemodynamic effect  - Completed total of 14 days of Micafungin (ended 9/27) - Off pressors.  - On midodrine 20 mg tid. Hypotensive this morning,  110/87 (93) (Personally reviewed). No change  2. Cardiac arrest (PEA/bradycardic) - 8/19 and 8/28 in setting of tamponade - resolved  3. Cardiac tamponade with emergent bedside sternotomy  - Diffuse epicardial bleeding with post-op Dresslers - return to OR 8/21 and 8/24 for washouts.  - Washout 8/28 in OR. Multiple units of blood products in OR.   - Chest closed on 9/2 - Hgb stable 9  4. Acute hypoxemic respiratory failure - Off ECMO.  - Perc Trach placed 9/7  - s/p bronchoscopy w/ BAL 9/4.  - tracheal aspirate 09/13 growing few GPC and rare yeast  - Started back on IV abx as above d/t concern for sepsis.  - Trach downsized - Trach de cannulated 9/25  - Resolved. On room air.   5. AKI due to ATN - CRRT started 08/28.  - Remains on anuric - Transitioned to [facility] 9/21. Nephrology following  - s/p [facility] cath by IR   - tolerating iHD (TTS schedule). 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 Now off colchicine   7. 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 Continue ASA 81 mg daily  8. DM2 - continue SSI   9. PAF/AFL - Tolerates poorly.  - Recurrent AFL. S/p DCCV to SR 08/29.  - Off amio  d/t bradycardia - Not on AC for now d/t bleeding, DVT dose heparin.  - In NSR today  10. ID - Completed vancomycin/meropenem with open chest.  - Prior possible aspiration with vomiting.  - now off meropenum   - WBC down to 9   11. FEN - TFs ongoing  - SLP following   12. Hyperkalemia - resolved - getting iHD   13. Unstageable Pressure Ulcer, Buttock  - WOC following.  - Improving  14. Fall - management per primary team  15. Diarrhea - mgm per primary - Imodium, some improvement  Earnie Larsson, AGACNP-BC  8:34 AM  Patient seen and examined with the above-signed Advanced Practice Provider and/or Housestaff. I personally reviewed laboratory data, imaging studies and relevant notes. I independently examined the patient and formulated the important aspects of the plan. I have edited the note to reflect any of my changes or salient points. I have personally discussed the plan with the patient and/or family.  Diarrhea improving. Remains thristy. Wants to drink. Denies CP or SOB.   BP has been labile on midodrine.   General:  Sitting up in chair  No resp difficulty HEENT: normal Neck: supple.JVP 8  Carotids 2+ bilat; no bruits. No lymphadenopathy or thryomegaly appreciated. Cor: PMI nondisplaced. Regular rate & rhythm. No rubs, gallops or murmurs. + line  Lungs: clear Abdomen: soft, nontender, nondistended. No hepatosplenomegaly. No bruits or masses. Good bowel sounds. Extremities: no cyanosis, clubbing, rash, edema Neuro: alert & orientedx3, cranial nerves grossly intact. RUE weak  Affect pleasant  Stable from cardiac standpoint. Ideally would like to start weaning midodrine slowly as BP tolerates and use mainly to support HD. Consider dropping to 15 tid tomorrow. Continue PT/OT. Needs feeding tube.   Glori Bickers, MD  11:20 AM

## 2021-11-19 NOTE — Procedures (Signed)
I was present at this dialysis session. I have reviewed the session itself and made appropriate changes.   Vital signs in last 24 hours:  Temp:  [97.8 F (36.6 C)-98.8 F (37.1 C)] 97.9 F (36.6 C) (10/10 0720) Pulse Rate:  [86-91] 88 (10/10 0720) Resp:  [18-26] 26 (10/10 0720) BP: (92-125)/(57-76) 125/70 (10/10 0946) SpO2:  [94 %-98 %] 94 % (10/10 0720) Weight:  [89.8 kg] 89.8 kg (10/10 0300) Weight change:  Filed Weights   11/16/21 0745 11/17/21 0500 11/19/21 0300  Weight: 90.7 kg 90.1 kg 89.8 kg    Recent Labs  Lab 11/19/21 0646  NA 135  K 5.0  CL 96*  CO2 24  GLUCOSE 114*  BUN 99*  CREATININE 6.65*  CALCIUM 9.2  PHOS 4.1    Recent Labs  Lab 11/15/21 0623 11/16/21 0400 11/19/21 0646  WBC 9.5 8.5 9.0  HGB 9.2* 8.8* 9.0*  HCT 30.3* 30.1* 29.4*  MCV 95.3 95.6 92.5  PLT 425* 452* 434*    Scheduled Meds:  sodium chloride   Intravenous Once   ascorbic acid  250 mg Per Tube BID   aspirin  81 mg Per Tube Daily   atorvastatin  80 mg Per Tube Daily   Chlorhexidine Gluconate Cloth  6 each Topical Daily   darbepoetin (ARANESP) injection - DIALYSIS  100 mcg Intravenous Q Tue-HD   feeding supplement (PROSource TF20)  60 mL Per Tube TID   feeding supplement (VITAL 1.5 CAL)  1,000 mL Per Tube Q24H   fentaNYL  1 patch Transdermal Q72H   fiber supplement (BANATROL TF)  60 mL Per Tube QID   folic acid  1 mg Per Tube Daily   Gerhardt's butt cream   Topical Daily   heparin injection (subcutaneous)  5,000 Units Subcutaneous Q8H   insulin aspart  0-20 Units Subcutaneous Q4H   insulin aspart  2 Units Subcutaneous Q4H   insulin glargine-yfgn  10 Units Subcutaneous QHS   leptospermum manuka honey  1 Application Topical Daily   midodrine  20 mg Per Tube TID WC   multivitamin  1 tablet Per Tube QHS   nutrition supplement (JUVEN)  1 packet Per Tube BID BM   mouth rinse  15 mL Mouth Rinse 4 times per day   pantoprazole  40 mg Per Tube BID   QUEtiapine  50 mg Per Tube QHS    sodium chloride flush  3 mL Intravenous Q12H   thiamine  100 mg Per Tube Daily   Continuous Infusions:  sodium chloride Stopped (11/09/21 0715)   sodium chloride Stopped (11/04/21 1021)   albumin human 12.5 g (11/01/21 2248)   [START ON 11/20/2021]  ceFAZolin (ANCEF) IV     PRN Meds:.sodium chloride, acetaminophen (TYLENOL) oral liquid 160 mg/5 mL, albumin human, camphor-menthol, clonazePAM, dextrose, diphenhydrAMINE, heparin, HYDROmorphone (DILAUDID) injection, iohexol, ipratropium-albuterol, lidocaine, loperamide HCl, ondansetron (ZOFRAN) IV, mouth rinse, [EXPIRED] oxyCODONE **FOLLOWED BY** oxyCODONE, polyvinyl alcohol, sodium chloride flush, white petrolatum   Mark Potts,  MD 11/19/2021, 9:56 AM

## 2021-11-19 NOTE — Progress Notes (Signed)
33 Days Post-Op Procedure(s) (LRB): REMOVAL OF IMPELLA LEFT VENTRICULAR ASSIST DEVICE (Right) TRANSESOPHAGEAL ECHOCARDIOGRAM (TEE) (N/A) PERCUTANEOUS TRACHEOSTOMY USING SHILEY FLEXIBLE 8 mm CUFFED TRACH. (N/A) Subjective:  Only complaint is loose stools from tube feeds. Tolerated HD well except for frequent stools.  Objective: Vital signs in last 24 hours: Temp:  [97.8 F (36.6 C)-98.8 F (37.1 C)] 97.9 F (36.6 C) (10/10 1659) Pulse Rate:  [61-92] 88 (10/10 1659) Cardiac Rhythm: Normal sinus rhythm (10/10 0705) Resp:  [11-26] 22 (10/10 1659) BP: (92-190)/(58-96) 114/62 (10/10 1659) SpO2:  [94 %-98 %] 94 % (10/10 0720) Weight:  [89.8 kg] 89.8 kg (10/10 0950)  Hemodynamic parameters for last 24 hours:    Intake/Output from previous day: 10/09 0701 - 10/10 0700 In: 2321 [NG/GT:2321] Out: -  Intake/Output this shift: Total I/O In: -  Out: 2 [Other:2]  General appearance: alert and cooperative Neurologic: intact Heart: regular rate and rhythm, S1, S2 normal, no murmur Lungs: clear to auscultation bilaterally Extremities: extremities normal, atraumatic, no cyanosis or edema Wound: chest dressing dry  Lab Results: Recent Labs    11/19/21 0646  WBC 9.0  HGB 9.0*  HCT 29.4*  PLT 434*   BMET:  Recent Labs    11/18/21 0011 11/19/21 0646  NA 135 135  K 4.8 5.0  CL 94* 96*  CO2 26 24  GLUCOSE 83 114*  BUN 76* 99*  CREATININE 5.03* 6.65*  CALCIUM 9.1 9.2    PT/INR:  Recent Labs    11/19/21 1524  LABPROT 13.7  INR 1.1   ABG    Component Value Date/Time   PHART 7.301 (L) 10/22/2021 2354   HCO3 19.3 (L) 10/22/2021 2354   TCO2 20 (L) 10/22/2021 2354   ACIDBASEDEF 7.0 (H) 10/22/2021 2354   O2SAT 71.7 11/02/2021 0439   CBG (last 3)  Recent Labs    11/19/21 0358 11/19/21 0716 11/19/21 1702  GLUCAP 97 123* 136*    Assessment/Plan: S/P Procedure(s) (LRB): REMOVAL OF IMPELLA LEFT VENTRICULAR ASSIST DEVICE (Right) TRANSESOPHAGEAL ECHOCARDIOGRAM (TEE)  (N/A) PERCUTANEOUS TRACHEOSTOMY USING SHILEY FLEXIBLE 8 mm CUFFED TRACH. (N/A)  He remains hemodynamically stable in sinus rhythm on midodrine. Will start to wean midodrine gradually.  Plan percutaneous gastric feeding tube by IR tomorrow under anesthesia. Will stop tube feeds now as well as insulin except for SSI.  Continue dressing changes.   Hopefully can have MBS again later this week.   LOS: 52 days    [name] 11/19/2021

## 2021-11-20 ENCOUNTER — Encounter (HOSPITAL_COMMUNITY): Payer: Self-pay | Admitting: Surgery

## 2021-11-20 ENCOUNTER — Inpatient Hospital Stay (HOSPITAL_COMMUNITY): Payer: Self-pay | Admitting: Certified Registered Nurse Anesthetist

## 2021-11-20 ENCOUNTER — Inpatient Hospital Stay (HOSPITAL_COMMUNITY): Payer: Self-pay

## 2021-11-20 ENCOUNTER — Other Ambulatory Visit (HOSPITAL_COMMUNITY): Payer: Self-pay

## 2021-11-20 ENCOUNTER — Encounter (HOSPITAL_COMMUNITY)
Admission: EM | Disposition: A | Discharge status: Skilled Nursing Facility | Payer: Self-pay | Source: Home / Self Care | Attending: Surgery

## 2021-11-20 ENCOUNTER — Ambulatory Visit: Payer: Self-pay | Admitting: Surgery

## 2021-11-20 DIAGNOSIS — R1310 Dysphagia, unspecified: Secondary | ICD-10-CM

## 2021-11-20 DIAGNOSIS — I2510 Atherosclerotic heart disease of native coronary artery without angina pectoris: Secondary | ICD-10-CM

## 2021-11-20 DIAGNOSIS — I252 Old myocardial infarction: Secondary | ICD-10-CM

## 2021-11-20 DIAGNOSIS — I10 Essential (primary) hypertension: Secondary | ICD-10-CM

## 2021-11-20 DIAGNOSIS — Z87891 Personal history of nicotine dependence: Secondary | ICD-10-CM

## 2021-11-20 HISTORY — PX: IR GASTROSTOMY TUBE MOD SED: IMG625

## 2021-11-20 HISTORY — PX: RADIOLOGY WITH ANESTHESIA: SHX6223

## 2021-11-20 LAB — RENAL FUNCTION PANEL
Albumin: 2.8 g/dL — ABNORMAL LOW (ref 3.5–5.0)
Anion gap: 16 — ABNORMAL HIGH (ref 5–15)
BUN: 84 mg/dL — ABNORMAL HIGH (ref 8–23)
CO2: 23 mmol/L (ref 22–32)
Calcium: 9.2 mg/dL (ref 8.9–10.3)
Chloride: 95 mmol/L — ABNORMAL LOW (ref 98–111)
Creatinine, Ser: 5.85 mg/dL — ABNORMAL HIGH (ref 0.61–1.24)
GFR, Estimated: 10 mL/min — ABNORMAL LOW (ref >60)
Glucose, Bld: 84 mg/dL (ref 70–99)
Phosphorus: 3.9 mg/dL (ref 2.5–4.6)
Potassium: 5.5 mmol/L — ABNORMAL HIGH (ref 3.5–5.1)
Sodium: 134 mmol/L — ABNORMAL LOW (ref 135–145)

## 2021-11-20 LAB — PROTIME-INR
INR: 1.1 (ref 0.8–1.2)
Prothrombin Time: 14.4 seconds (ref 11.4–15.2)

## 2021-11-20 LAB — CBC
HCT: 31.5 % — ABNORMAL LOW (ref 39.0–52.0)
Hemoglobin: 9.5 g/dL — ABNORMAL LOW (ref 13.0–17.0)
MCH: 28.1 pg (ref 26.0–34.0)
MCHC: 30.2 g/dL (ref 30.0–36.0)
MCV: 93.2 fL (ref 80.0–100.0)
Platelets: 416 10*3/uL — ABNORMAL HIGH (ref 150–400)
RBC: 3.38 MIL/uL — ABNORMAL LOW (ref 4.22–5.81)
RDW: 17.4 % — ABNORMAL HIGH (ref 11.5–15.5)
WBC: 8.2 10*3/uL (ref 4.0–10.5)
nRBC: 0.2 % (ref 0.0–0.2)

## 2021-11-20 LAB — GLUCOSE, CAPILLARY
Glucose-Capillary: 85 mg/dL (ref 70–99)
Glucose-Capillary: 79 mg/dL (ref 70–99)
Glucose-Capillary: 81 mg/dL (ref 70–99)
Glucose-Capillary: 87 mg/dL (ref 70–99)
Glucose-Capillary: 88 mg/dL (ref 70–99)
Glucose-Capillary: 128 mg/dL — ABNORMAL HIGH (ref 70–99)
Glucose-Capillary: 137 mg/dL — ABNORMAL HIGH (ref 70–99)
Glucose-Capillary: 127 mg/dL — ABNORMAL HIGH (ref 70–99)

## 2021-11-20 LAB — MAGNESIUM: Magnesium: 1.8 mg/dL (ref 1.7–2.4)

## 2021-11-20 SURGERY — IR WITH ANESTHESIA
Anesthesia: General

## 2021-11-20 MED ORDER — PHENYLEPHRINE 80 MCG/ML (10ML) SYRINGE FOR IV PUSH (FOR BLOOD PRESSURE SUPPORT)
PREFILLED_SYRINGE | INTRAVENOUS | Status: DC | PRN
  Administered 2021-11-20 (×2): 160 ug via INTRAVENOUS

## 2021-11-20 MED ORDER — EPHEDRINE SULFATE (PRESSORS) 50 MG/ML IJ SOLN
INTRAMUSCULAR | Status: DC | PRN
  Administered 2021-11-20: 5 mg via INTRAVENOUS

## 2021-11-20 MED ORDER — CEFAZOLIN SODIUM-DEXTROSE 2-3 GM-%(50ML) IV SOLR
INTRAVENOUS | Status: DC | PRN
  Administered 2021-11-20: 2 g via INTRAVENOUS

## 2021-11-20 MED ORDER — LIDOCAINE VISCOUS HCL 2 % MT SOLN
OROMUCOSAL | Status: AC
  Administered 2021-11-20: 5 mL
  Filled 2021-11-20: qty 15

## 2021-11-20 MED ORDER — SODIUM CHLORIDE 0.9 % IV SOLN: INTRAVENOUS | Status: DC | PRN

## 2021-11-20 MED ORDER — MIDODRINE HCL 5 MG PO TABS
10.0000 mg | ORAL_TABLET | ORAL | Status: DC
  Administered 2021-11-21 – 2021-12-03 (×13): 10 mg via ORAL
  Filled 2021-11-20 (×13): qty 2

## 2021-11-20 MED ORDER — LIDOCAINE HCL 1 % IJ SOLN
INTRAMUSCULAR | Status: AC
  Administered 2021-11-20: 7 mL
  Filled 2021-11-20: qty 20

## 2021-11-20 MED ORDER — FENTANYL CITRATE (PF) 100 MCG/2ML IJ SOLN: 25.0000 ug | INTRAMUSCULAR | Status: DC | PRN

## 2021-11-20 MED ORDER — ROCURONIUM BROMIDE 10 MG/ML (PF) SYRINGE
PREFILLED_SYRINGE | INTRAVENOUS | Status: DC | PRN
  Administered 2021-11-20: 30 mg via INTRAVENOUS

## 2021-11-20 MED ORDER — SODIUM ZIRCONIUM CYCLOSILICATE 10 G PO PACK
10.0000 g | PACK | Freq: Once | ORAL | Status: AC
  Administered 2021-11-20: 10 g
  Filled 2021-11-20: qty 1

## 2021-11-20 MED ORDER — ONDANSETRON HCL 4 MG/2ML IJ SOLN
INTRAMUSCULAR | Status: DC | PRN
  Administered 2021-11-20: 4 mg via INTRAVENOUS

## 2021-11-20 MED ORDER — FENTANYL CITRATE (PF) 250 MCG/5ML IJ SOLN
INTRAMUSCULAR | Status: DC | PRN
  Administered 2021-11-20: 50 ug via INTRAVENOUS

## 2021-11-20 MED ORDER — FENTANYL CITRATE (PF) 100 MCG/2ML IJ SOLN
INTRAMUSCULAR | Status: AC
  Filled 2021-11-20: qty 2

## 2021-11-20 MED ORDER — IOHEXOL 300 MG/ML  SOLN
50.0000 mL | Freq: Once | INTRAMUSCULAR | Status: AC | PRN
  Administered 2021-11-20: 20 mL

## 2021-11-20 MED ORDER — DEXAMETHASONE SODIUM PHOSPHATE 10 MG/ML IJ SOLN
INTRAMUSCULAR | Status: DC | PRN
  Administered 2021-11-20: 4 mg via INTRAVENOUS

## 2021-11-20 MED ORDER — ALBUMIN HUMAN 5 % IV SOLN: INTRAVENOUS | Status: DC | PRN

## 2021-11-20 MED ORDER — CHLORHEXIDINE GLUCONATE 0.12 % MT SOLN
OROMUCOSAL | Status: AC
  Filled 2021-11-20: qty 15

## 2021-11-20 MED ORDER — PHENYLEPHRINE HCL-NACL 20-0.9 MG/250ML-% IV SOLN
INTRAVENOUS | Status: DC | PRN
  Administered 2021-11-20: 20 ug/min via INTRAVENOUS
  Administered 2021-11-20: 25 ug/min via INTRAVENOUS

## 2021-11-20 MED ORDER — LIDOCAINE 2% (20 MG/ML) 5 ML SYRINGE
INTRAMUSCULAR | Status: DC | PRN
  Administered 2021-11-20: 100 mg via INTRAVENOUS

## 2021-11-20 MED ORDER — MIDAZOLAM HCL 2 MG/2ML IJ SOLN
INTRAMUSCULAR | Status: DC | PRN
  Administered 2021-11-20 (×2): 1 mg via INTRAVENOUS

## 2021-11-20 MED ORDER — HEPARIN SODIUM (PORCINE) 5000 UNIT/ML IJ SOLN
5000.0000 [IU] | Freq: Three times a day (TID) | INTRAMUSCULAR | Status: DC
  Administered 2021-11-21 – 2021-12-03 (×34): 5000 [IU] via SUBCUTANEOUS
  Filled 2021-11-20 (×31): qty 1

## 2021-11-20 MED ORDER — PROPOFOL 10 MG/ML IV BOLUS
INTRAVENOUS | Status: DC | PRN
  Administered 2021-11-20: 160 mg via INTRAVENOUS

## 2021-11-20 MED ORDER — MIDAZOLAM HCL 2 MG/2ML IJ SOLN
INTRAMUSCULAR | Status: AC
  Filled 2021-11-20: qty 2

## 2021-11-20 MED ORDER — SUCCINYLCHOLINE CHLORIDE 200 MG/10ML IV SOSY
PREFILLED_SYRINGE | INTRAVENOUS | Status: DC | PRN
  Administered 2021-11-20: 160 mg via INTRAVENOUS

## 2021-11-20 MED ORDER — SUGAMMADEX SODIUM 200 MG/2ML IV SOLN
INTRAVENOUS | Status: DC | PRN
  Administered 2021-11-20: 200 mg via INTRAVENOUS

## 2021-11-20 NOTE — Anesthesia Procedure Notes (Signed)
Procedure Name: Intubation Date/Time: 11/20/2021 11:42 AM  Performed by: Inda Coke, CRNAPre-anesthesia Checklist: Patient identified, Emergency Drugs available, Suction available, Timeout performed and Patient being monitored Patient Re-evaluated:Patient Re-evaluated prior to induction Oxygen Delivery Method: Circle system utilized Preoxygenation: Pre-oxygenation with 100% oxygen Induction Type: IV induction, Rapid sequence and Cricoid Pressure applied Ventilation: Mask ventilation without difficulty, Two handed mask ventilation required and Oral airway inserted - appropriate to patient size Laryngoscope Size: Glidescope and 4 Grade View: Grade I Tube type: Oral Tube size: 7.5 mm Number of attempts: 2 Airway Equipment and Method: Stylet and Rigid stylet Placement Confirmation: ETT inserted through vocal cords under direct vision, positive ETCO2, CO2 detector and breath sounds checked- equal and bilateral Secured at: 23 cm Tube secured with: Tape Dental Injury: Teeth and Oropharynx as per pre-operative assessment  Difficulty Due To: Difficult Airway- due to anterior larynx Comments: DLx 1 with MAC 4 blade and grade III view, DL x 2 with glidescope MAC 4 blade and grade I view with smooth intubation

## 2021-11-20 NOTE — Progress Notes (Signed)
Nutrition Follow-up  DOCUMENTATION CODES:   Not applicable  INTERVENTION:   Once PEG is cleared for use for tube feeds, recommend transitioning to bolus tube feeding regimen: - 1 carton (237 ml) Nepro formula 5 x daily - PROSource TF20 60 ml TID  Recommended bolus tube feeding regimen would provide 2340 kcal, 155 grams of protein, and 860 ml of H2O.  - Continue Juven BID per tube, each packet provides 80 calories, 8 grams of carbohydrate, 2.5 grams of protein (collagen), 7 grams of L-arginine and 7 grams of L-glutamine; supplement contains CaHMB, vitamins C, E, B12 and zinc to promote wound healing   - Continue vitamin C, zinc, folic acid, and renal MVI supplementation   - Continue Banatrol TF QID  NUTRITION DIAGNOSIS:   Inadequate oral intake related to acute illness as evidenced by NPO status.  Ongoing, being addressed via TF  GOAL:   Patient will meet greater than or equal to 90% of their needs  Will be met once TF resumed  MONITOR:   Diet advancement, Labs, Weight trends, TF tolerance, Skin, I & O's  REASON FOR ASSESSMENT:   Ventilator    ASSESSMENT:   61 yo male admitted with acute on chronic CHF with recent CABG, symptomatic bradycardia and hypotension. PMH includes DM, CAD s/p recent CABG x 5, CKD 3, CVA  08/19 - s/p pericardial window, evacuation of pericardial and pleural effusion 08/20 - intubated, bedside mediastinal re-exploration, cardiac tamponade, mediastinal clot; PEA arrest due to tamponade, VA ECMO cannulation 08/21 - return to OR for bleeding, mediastinal re-exploration, Cortrak placed (gastric) 08/23 - TEE with EF 25-30% 08/25 - Cortrak advanced to post-pyloric position 08/28 - OR: VA ECMO decannulation, Impella placed, mediastinal washout, CRRT initiated 08/29 - brief arrest 09/02 - OR: mediastinal washout, sternal closure 09/05 - vomiting large volume, TF held, NG placed to LIS; watery, foul smelling diarrhea, KUB negative for  ileus/obstruction, confirmed Cortrak is post-pyloric 09/07 - OR: Impella removed, trach placed 09/08 - CRRT held 09/09 - CRRT restarted, MD changed to Nepro 09/10 - TF off due to intolerance 09/11 - tolerating TF on Vital 1.5 formula 09/20 - CRRT discontinued 09/25 - trach de-cannulated 09/22 - failed FEES (severe aspiration risk, NPO) 09/29 - repeat FEES (severe aspiration risk, NPO 10/06 - s/p IR attempt at PEG placement, pt withdrew consent during procedure 10/11 - s/p PEG placement by IR under general anesthesia  Pt in having PEG tube placed at time of RD visit. Per IR note, PEG can be used within 24 hours for tube feeds. Will leave bolus tube feeding recommendations.  Pt has been tolerating Vital 1.5 @ 60 ml/hr via Cortrak. Pt remains on Juven and vitamins/minerals for wound healing. Pt continues to have multiple type 6 and type 7 bowel movements daily despite Banatrol TF QID.  Weight stable over the last week but down ~13 kg total since admission.  Potassium up despite HD yesterday. Will try low potassium TF formula and assess for tolerance.  Admit weight: 101 kg Current weight: 88.1 kg  Medications reviewed and include: vitamin C 250 mg BID, aranesp weekly, banatrol TF 60 ml QID, folic acid, SSI q 4 hours, rena-vit, Juven BID, protonix, thiamine  Labs reviewed: sodium 134, potassium 5.5, chloride 95, hemoglobin 9.5 CBG's: 79-136 x 24 hours  Diet Order:   Diet Order     None       EDUCATION NEEDS:   Not appropriate for education at this time  Skin:  Skin Assessment: Skin Integrity  Issues: Stage II: R upper buttock, L ischium Unstageable: L upper  buttock, gluteal cleft/distal sacrum Incisions: chest (closed, wound VAC removed) Other: sheer/friction to right scapula region  Last BM:  11/20/21 type 6/type 7  Height:   Ht Readings from Last 1 Encounters:  10/23/21 _0  (1.702 m)    Weight:   Wt Readings from Last 1 Encounters:  11/20/21 88.1 kg     BMI:  Body mass index is 30.42 kg/m.  Estimated Nutritional Needs:   Kcal:  2200-2400 kcals  Protein:  135-165 grams  Fluid:  1.8 L    Gustavus Bryant, MS, RD, LDN Inpatient Clinical Dietitian Please see AMiON for contact information.

## 2021-11-20 NOTE — Progress Notes (Signed)
Day of Surgery Procedure(s) (LRB): G-Tube Placement (N/A) Subjective: Some pain from G-tube insertion today. No loose stools.  Objective: Vital signs in last 24 hours: Temp:  [97.4 F (36.3 C)-98.8 F (37.1 C)] 98.4 F (36.9 C) (10/11 1614) Pulse Rate:  [81-84] 81 (10/11 1614) Cardiac Rhythm: Normal sinus rhythm (10/11 1315) Resp:  [18-25] 25 (10/11 1614) BP: (103-149)/(59-94) 149/87 (10/11 1614) SpO2:  [95 %-100 %] 100 % (10/11 1614) Weight:  [88.1 kg] 88.1 kg (10/11 0536)  Hemodynamic parameters for last 24 hours:    Intake/Output from previous day: 10/10 0701 - 10/11 0700 In: 1579 [NG/GT:1579] Out: 2  Intake/Output this shift: Total I/O In: 700 [I.V.:450; IV Piggyback:250] Out: 2 [Blood:2]  General appearance: alert and cooperative Neurologic: intact Heart: regular rate and rhythm, S1, S2 normal, no murmur Lungs: clear to auscultation bilaterally Abdomen: soft, non-tender Extremities: extremities normal, atraumatic, no cyanosis or edema Wound: chest dressing intact  Lab Results: Recent Labs    11/19/21 0646 11/20/21 0009  WBC 9.0 8.2  HGB 9.0* 9.5*  HCT 29.4* 31.5*  PLT 434* 416*   BMET:  Recent Labs    11/19/21 0646 11/20/21 0009  NA 135 134*  K 5.0 5.5*  CL 96* 95*  CO2 24 23  GLUCOSE 114* 84  BUN 99* 84*  CREATININE 6.65* 5.85*  CALCIUM 9.2 9.2    PT/INR:  Recent Labs    11/20/21 0009  LABPROT 14.4  INR 1.1   ABG    Component Value Date/Time   PHART 7.301 (L) 10/22/2021 2354   HCO3 19.3 (L) 10/22/2021 2354   TCO2 20 (L) 10/22/2021 2354   ACIDBASEDEF 7.0 (H) 10/22/2021 2354   O2SAT 71.7 11/02/2021 0439   CBG (last 3)  Recent Labs    11/20/21 1246 11/20/21 1329 11/20/21 1612  GLUCAP 88 87 128*    Assessment/Plan: S/P Procedure(s) (LRB): G-Tube Placement (N/A)  BP has been up today. Will change midodrine to 10 tid only on HD days for now and titrate off as tolerated.  Gastrostomy tube today. Will plan to resume bolus tube  feeds tomorrow afternoon.  Plan HD tomorrow.   MBS per ST possibly Friday?  Continue PT/OT.  Dressing changes as ordered to sacral pressure sore and chest wounds   LOS: 53 days    [name] 11/20/2021

## 2021-11-20 NOTE — Progress Notes (Addendum)
Patient ID: JAH ALARID, male   DOB: 1960-12-23, 61 y.o.   MRN: [id number]   Advanced Heart Failure Rounding Note   Subjective:    - 8/19 Pericardial window - 8/20 Cardiac arrest with tamponade -> Emergent bedside washout - 09/29/21 VA Cannulation - 09/30/21 Return to OR for mediastinal hemorrhage - 10/01/21 Developed AF -> amio - 10/02/21 TEE EF 25-30%  - 10/04/21 OR for washout. C/b continued bleeding overnight - 10/07/21 Placement of Impella 5.5 with washout, VA ECMO decannulation. Hypotensive with development of severe RV dysfunction after ECMO off and pressors titrated up. Multiple units of blood products. - 10/08/21 Brief PEA arrest. AFL with RVR >> S/p DCCV to SR, back in AFL shortly after - 8/31 Give 1UPRBCs  - 9/2 OR for chest closure - 9/3 Hypotensive overnight w/ SBPs in 80s. Febrile, mTemp 100.8. CRRT paused. VP increased to 0.04.  - 9/4 s/p bronchoscopy w/ BAL by PCCM, Cx NGTD  - 9/5 vomiting w/ large volume NGT output + watery/foul diarrhea, Tube feeds held. No signs of ileus on KUB. C-diff negative   - 9/7 OR for Impella Extraction and percutaneous tracheostomy. 2 u RBCs. - 9/8 CVVH stopped and line removed for holiday - 9/9 CVVH restarted.  -9/13 Worsening leukocytosis and pressor requirements. Purulent drainage from arterial line. Started Daptomycin, Micafungin and Meropenem. - 9/17 Daptomycin + Micafungin stopped. Remains on Meropenum  -9/19 CRRT holiday.  -9/20 Trach downsized  -9/21 transitioned to iHD  - 9/25 Tunneled HD cath placed. Trach decannulation - 9/26 Hgb 6.1>>transfused 2uRBC   Had failed g-tube placement with mod sedation 10/6, plan for Gtube today under GA.   Still experiencing diarrhea, some improvement. Denies CP and SOB. Pain in sacral area from wounds.   Objective:     Vital Signs:   Temp:  [97.7 F (36.5 C)-98.7 F (37.1 C)] 98.1 F (36.7 C) (10/11 0317) Pulse Rate:  [61-92] 66 (10/10 1730) Resp:  [11-26] 20 (10/11 0317) BP:  (92-190)/(58-96) 103/59 (10/11 0317) SpO2:  [94 %-98 %] 98 % (10/11 0317) Weight:  [88.1 kg-89.8 kg] 88.1 kg (10/11 0536) Last BM Date : 11/19/21  Weight change: Filed Weights   11/19/21 0300 11/19/21 0950 11/20/21 0536  Weight: 89.8 kg 89.8 kg 88.1 kg    Intake/Output:   Intake/Output Summary (Last 24 hours) at 11/20/2021 0705 Last data filed at 11/19/2021 1413 Gross per 24 hour  Intake --  Output 2 ml  Net -2 ml    General:  chronically ill appearing.  No respiratory difficulty HEENT: normal Neck: supple. JVD ~7-8 cm. Carotids 2+ bilat; no bruits. No lymphadenopathy or thyromegaly appreciated. Cor: PMI nondisplaced. Regular rate & rhythm. No rubs, gallops or murmurs. +HD cath Lungs: clear Abdomen: soft, nontender, nondistended. No hepatosplenomegaly. No bruits or masses. Good bowel sounds. Extremities: no cyanosis, clubbing, rash, edema. RUE weakness Neuro: alert & oriented x 3, cranial nerves grossly intact. moves all 4 extremities w/o difficulty. Affect pleasant.   Telemetry: NSR 80s (Personally reviewed)    Labs: Basic Metabolic Panel: Recent Labs  Lab 11/16/21 0400 11/17/21 0936 11/18/21 0011 11/19/21 0646 11/20/21 0009  NA 132* 135 135 135 134*  K 4.7 4.6 4.8 5.0 5.5*  CL 93* 96* 94* 96* 95*  CO2 _0 GLUCOSE 115* 128* 83 114* 84  BUN 88* 51* 76* 99* 84*  CREATININE 5.48* 4.22* 5.03* 6.65* 5.85*  CALCIUM 9.3 9.0 9.1 9.2 9.2  MG 1.9 1.7 1.8 2.0 1.8  PHOS  4.7* 3.8 3.4 4.1 3.9    Liver Function Tests: Recent Labs  Lab 11/16/21 0400 11/17/21 0936 11/18/21 0011 11/19/21 0646 11/20/21 0009  ALBUMIN 2.6* 2.6* 2.6* 2.6* 2.8*   No results for input(s): "LIPASE", "AMYLASE" in the last 168 hours. No results for input(s): "AMMONIA" in the last 168 hours.  CBC: Recent Labs  Lab 11/14/21 0427 11/15/21 0623 11/16/21 0400 11/19/21 0646 11/20/21 0009  WBC 10.6* 9.5 8.5 9.0 8.2  HGB 8.8* 9.2* 8.8* 9.0* 9.5*  HCT 29.3* 30.3* 30.1* 29.4* 31.5*   MCV 95.1 95.3 95.6 92.5 93.2  PLT 426* 425* 452* 434* 416*    Cardiac Enzymes: No results for input(s): "CKTOTAL", "CKMB", "CKMBINDEX", "TROPONINI" in the last 168 hours.   BNP: BNP (last 3 results) Recent Labs    12/24/20 0651 09/28/21 0405  BNP 412.0* 826.6*    ProBNP (last 3 results) No results for input(s): "PROBNP" in the last 8760 hours.    Other results:  Imaging: No results found.   Medications:     Scheduled Medications:  sodium chloride   Intravenous Once   ascorbic acid  250 mg Per Tube BID   aspirin  81 mg Per Tube Daily   atorvastatin  80 mg Per Tube Daily   Chlorhexidine Gluconate Cloth  6 each Topical Daily   darbepoetin (ARANESP) injection - DIALYSIS  100 mcg Intravenous Q Tue-HD   feeding supplement (PROSource TF20)  60 mL Per Tube TID   fentaNYL  1 patch Transdermal Q72H   fiber supplement (BANATROL TF)  60 mL Per Tube QID   folic acid  1 mg Per Tube Daily   Gerhardt's butt cream   Topical Daily   heparin injection (subcutaneous)  5,000 Units Subcutaneous Q8H   insulin aspart  0-20 Units Subcutaneous Q4H   leptospermum manuka honey  1 Application Topical Daily   midodrine  15 mg Per Tube TID WC   multivitamin  1 tablet Per Tube QHS   nutrition supplement (JUVEN)  1 packet Per Tube BID BM   mouth rinse  15 mL Mouth Rinse 4 times per day   pantoprazole  40 mg Per Tube BID   QUEtiapine  50 mg Per Tube QHS   sodium chloride flush  3 mL Intravenous Q12H   thiamine  100 mg Per Tube Daily    Infusions:  sodium chloride Stopped (11/09/21 0715)   sodium chloride Stopped (11/04/21 1021)   albumin human 12.5 g (11/01/21 2248)    ceFAZolin (ANCEF) IV      PRN Medications: sodium chloride, acetaminophen (TYLENOL) oral liquid 160 mg/5 mL, albumin human, camphor-menthol, clonazePAM, dextrose, diphenhydrAMINE, HYDROmorphone (DILAUDID) injection, iohexol, ipratropium-albuterol, lidocaine, loperamide HCl, ondansetron (ZOFRAN) IV, mouth rinse,  [EXPIRED] oxyCODONE **FOLLOWED BY** oxyCODONE, polyvinyl alcohol, sodium chloride flush, white petrolatum   Assessment/Plan:    1. Shock - mixed cardiogenic/hemorrhagic -> VA ECMO -> decannulated on 8/28 to Impella 5.5 - Echo 08/28: Underfilled LV, EF 55-60% with severe LVH and near normal RV systsystolic function.  - Impella extracted 9/7 - Bedside echo 09/13 - LV function preserved, RV mildly reduced. Small clot in posterior pericardium but no hemodynamic effect  - Completed total of 14 days of Micafungin (ended 9/27) - Off pressors.  - On midodrine 20 mg tid. Will try to start weaning after surgery>15 TID  2. Cardiac arrest (PEA/bradycardic) - 8/19 and 8/28 in setting of tamponade - resolved  3. Cardiac tamponade with emergent bedside sternotomy  - Diffuse epicardial bleeding with post-op  Dresslers - return to OR 8/21 and 8/24 for washouts.  - Washout 8/28 in OR. Multiple units of blood products in OR.   - Chest closed on 9/2 - Hgb stable 9.5  4. Acute hypoxemic respiratory failure - Off ECMO.  - Perc Trach placed 9/7  - s/p bronchoscopy w/ BAL 9/4.  - tracheal aspirate 09/13 growing few GPC and rare yeast  - Started back on IV abx as above d/t concern for sepsis.  - Trach downsized - Trach de cannulated 9/25  - Resolved. On room air.   5. AKI due to ATN - CRRT started 08/28.  - Remains anuric - Transitioned to [facility] 9/21. Nephrology following  - s/p [facility] cath by IR   - tolerating iHD (TTS schedule). 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 Now off colchicine   7. 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 Continue ASA 81 mg daily  8. DM2 - continue SSI   9. PAF/AFL - Tolerates poorly.  - Recurrent AFL. S/p DCCV to SR 08/29.  - Off amio d/t bradycardia - Not on AC for now d/t bleeding, DVT dose heparin.  - In NSR today  10. ID - Completed vancomycin/meropenem with open chest.  - Prior possible aspiration with vomiting.  -  now off meropenum   - WBC down to 9.5  11. FEN - TFs ongoing  - SLP following   12. Hyperkalemia - 5.5 this am  - iHD yesterday - NSR, consider Lokelma post op. Hesitant to do IV insulin/dex w/ current NPO status  13. Unstageable Pressure Ulcer, Buttock  - WOC following.  - Improving, still very painful  14. Fall - management per primary team  15. Diarrhea - mgm per primary - Imodium, some improvement  Earnie Larsson, AGACNP-BC  7:05 AM  Patient seen and examined with the above-signed Advanced Practice Provider and/or Housestaff. I personally reviewed laboratory data, imaging studies and relevant notes. I independently examined the patient and formulated the important aspects of the plan. I have edited the note to reflect any of my changes or salient points. I have personally discussed the plan with the patient and/or family.  Underwent G-tube placement with IR today. Did well. No CP or SOB. Sacrum still sore  BP stable on midodrine  General:  Weak appearing. No resp difficulty HEENT: normal Neck: supple. no JVD. Carotids 2+ bilat; no bruits. No lymphadenopathy or thryomegaly appreciated. Cor: PMI nondisplaced. Regular rate & rhythm. No rubs, gallops or murmurs. Lungs: clear Abdomen: soft, nontender, nondistended. No hepatosplenomegaly. No bruits or masses. Good bowel sounds. + G-tube Extremities: no cyanosis, clubbing, rash, edema Neuro: alert & orientedx3, cranial nerves grossly intact. moves all 4 extremities w/o difficulty. Affect pleasant  Now s/p G-tube.Stable from cardiac perspective. Begin to wean midodrine as tolerated. Continue HD.   Glori Bickers, MD  4:06 PM

## 2021-11-20 NOTE — Plan of Care (Signed)
Problem: Education: Goal: Will demonstrate proper wound care and an understanding of methods to prevent future damage Outcome: Progressing Goal: Knowledge of disease or condition will improve Outcome: Progressing Goal: Knowledge of the prescribed therapeutic regimen will improve Outcome: Progressing Goal: Individualized Educational Video(s) Outcome: Progressing   Problem: Activity: Goal: Risk for activity intolerance will decrease Outcome: Progressing   Problem: Cardiac: Goal: Will achieve and/or maintain hemodynamic stability Outcome: Progressing   Problem: Clinical Measurements: Goal: Postoperative complications will be avoided or minimized Outcome: Progressing   Problem: Respiratory: Goal: Respiratory status will improve Outcome: Progressing   Problem: Skin Integrity: Goal: Wound healing without signs and symptoms of infection Outcome: Progressing Goal: Risk for impaired skin integrity will decrease Outcome: Progressing   Problem: Urinary Elimination: Goal: Ability to achieve and maintain adequate renal perfusion and functioning will improve Outcome: Progressing   Problem: Education: Goal: Understanding of cardiac disease, CV risk reduction, and recovery process will improve Outcome: Progressing Goal: Individualized Educational Video(s) Outcome: Progressing   Problem: Activity: Goal: Ability to tolerate increased activity will improve Outcome: Progressing   Problem: Cardiac: Goal: Ability to achieve and maintain adequate cardiovascular perfusion will improve Outcome: Progressing   Problem: Health Behavior/Discharge Planning: Goal: Ability to safely manage health-related needs after discharge will improve Outcome: Progressing   Problem: Education: Goal: Knowledge of General Education information will improve Description: Including pain rating scale, medication(s)/side effects and non-pharmacologic comfort measures Outcome: Progressing   Problem: Health  Behavior/Discharge Planning: Goal: Ability to manage health-related needs will improve Outcome: Progressing   Problem: Clinical Measurements: Goal: Ability to maintain clinical measurements within normal limits will improve Outcome: Progressing Goal: Will remain free from infection Outcome: Progressing Goal: Diagnostic test results will improve Outcome: Progressing Goal: Respiratory complications will improve Outcome: Progressing Goal: Cardiovascular complication will be avoided Outcome: Progressing   Problem: Activity: Goal: Risk for activity intolerance will decrease Outcome: Progressing   Problem: Nutrition: Goal: Adequate nutrition will be maintained Outcome: Progressing   Problem: Coping: Goal: Level of anxiety will decrease Outcome: Progressing   Problem: Elimination: Goal: Will not experience complications related to bowel motility Outcome: Progressing Goal: Will not experience complications related to urinary retention Outcome: Progressing   Problem: Pain Managment: Goal: General experience of comfort will improve Outcome: Progressing   Problem: Safety: Goal: Ability to remain free from injury will improve Outcome: Progressing   Problem: Skin Integrity: Goal: Risk for impaired skin integrity will decrease Outcome: Progressing   Problem: Education: Goal: Ability to describe self-care measures that may prevent or decrease complications (Diabetes Survival Skills Education) will improve Outcome: Progressing Goal: Individualized Educational Video(s) Outcome: Progressing   Problem: Coping: Goal: Ability to adjust to condition or change in health will improve Outcome: Progressing   Problem: Fluid Volume: Goal: Ability to maintain a balanced intake and output will improve Outcome: Progressing   Problem: Health Behavior/Discharge Planning: Goal: Ability to identify and utilize available resources and services will improve Outcome: Progressing Goal:  Ability to manage health-related needs will improve Outcome: Progressing   Problem: Metabolic: Goal: Ability to maintain appropriate glucose levels will improve Outcome: Progressing   Problem: Nutritional: Goal: Maintenance of adequate nutrition will improve Outcome: Progressing Goal: Progress toward achieving an optimal weight will improve Outcome: Progressing   Problem: Skin Integrity: Goal: Risk for impaired skin integrity will decrease Outcome: Progressing   Problem: Tissue Perfusion: Goal: Adequacy of tissue perfusion will improve Outcome: Progressing   Problem: Education: Goal: Knowledge about tracheostomy care/management will improve Outcome: Progressing   Problem: Activity: Goal:  Ability to tolerate increased activity will improve Outcome: Progressing

## 2021-11-20 NOTE — Procedures (Signed)
Pre procedure Dx: Dysphagia Post Procedure Dx: Same  Successful fluoroscopic guided insertion of gastrostomy tube.   The gastrostomy tube may be used immediately for medications.   Tube feeds may be initiated in 24 hours as per the primary team.    EBL: Trace  Complications: None immediate  Jay Vivian Okelley, MD Pager #: [phone]     

## 2021-11-20 NOTE — Anesthesia Preprocedure Evaluation (Addendum)
Anesthesia Evaluation  Patient identified by MRN, date of birth, ID band Patient awake    Reviewed: Allergy & Precautions, NPO status , Patient's Chart, lab work & pertinent test results, reviewed documented beta blocker date and time   History of Anesthesia Complications Negative for: history of anesthetic complications  Airway Mallampati: II  TM Distance: >3 FB Neck ROM: Full    Dental  (+) Missing,    Pulmonary Patient abstained from smoking., former smoker,    Pulmonary exam normal        Cardiovascular hypertension, Pt. on medications and Pt. on home beta blockers + CAD, + Past MI (2017) and + Cardiac Stents  Normal cardiovascular exam     Neuro/Psych CVA negative psych ROS   GI/Hepatic negative GI ROS, Neg liver ROS,   Endo/Other  diabetes, Type 2, Insulin Dependent  Renal/GU Renal disease  negative genitourinary   Musculoskeletal negative musculoskeletal ROS (+)   Abdominal   Peds  Hematology negative hematology ROS (+)   Anesthesia Other Findings Day of surgery medications reviewed with patient.  Reproductive/Obstetrics negative OB ROS                            Anesthesia Physical Anesthesia Plan  ASA: 4  Anesthesia Plan: General   Post-op Pain Management: Minimal or no pain anticipated   Induction: Intravenous  PONV Risk Score and Plan: 2 and Treatment may vary due to age or medical condition, Ondansetron, Dexamethasone and Midazolam  Airway Management Planned: Oral ETT  Additional Equipment: None  Intra-op Plan:   Post-operative Plan: Extubation in OR  Informed Consent: I have reviewed the patients History and Physical, chart, labs and discussed the procedure including the risks, benefits and alternatives for the proposed anesthesia with the patient or authorized representative who has indicated his/her understanding and acceptance.     Dental advisory  given  Plan Discussed with: CRNA  Anesthesia Plan Comments:        Anesthesia Quick Evaluation

## 2021-11-20 NOTE — Progress Notes (Signed)
Pt has been financially cleared to receive out-pt HD with [facility] at d/c. Pt has been accepted at [facility]) on TTS 12:40 chair time when pt stable for d/c. Pt will need to arrive at 11:50 for first appt to complete paperwork prior to treatment. Clinic aware pt's d/c date is unknown at this time. Will provide an update to [facility] staff for d/c planning purposes. Will assist as needed.   Melven Sartorius Renal Navigator [phone]

## 2021-11-20 NOTE — H&P (Signed)
HPI:  The patient has had a H&P performed within the last 30 days, all history, medications, and exam have been reviewed.  Plan to proceed with perc G-tube in IR today with anesthesia. Pt feels okay this am. No new c/o  Medications:  Current Facility-Administered Medications:    0.9 %  sodium chloride infusion (Manually program via Guardrails IV Fluids), , Intravenous, Once, Ayesha Rumpf, Stephanie M, PA-C   0.9 %  sodium chloride infusion, , Intravenous, PRN, [name], MD, Stopped at 11/09/21 0715   0.9 %  sodium chloride infusion, 250 mL, Intravenous, Continuous, Bartle, Fernande Boyden, MD, Paused at 11/04/21 1021   acetaminophen (TYLENOL) 160 MG/5ML solution 650 mg, 650 mg, Per Tube, Q6H PRN, Sabharwal, Aditya, DO, 650 mg at 11/16/21 1418   albumin human 5 % solution 12.5 g, 12.5 g, Intravenous, PRN, Elmarie Shiley, MD, Last Rate: 60 mL/hr at 11/01/21 2248, 12.5 g at 11/01/21 2248   ascorbic acid (VITAMIN C) tablet 250 mg, 250 mg, Per Tube, BID, Jacky Kindle, MD, 250 mg at 11/19/21 2102   aspirin chewable tablet 81 mg, 81 mg, Per Tube, Daily, [name], MD, 81 mg at 11/18/21 1134   atorvastatin (LIPITOR) tablet 80 mg, 80 mg, Per Tube, Daily, [name], MD, 80 mg at 11/18/21 1133   camphor-menthol (SARNA) lotion, , Topical, PRN, Burnard Leigh, Shane Crutch, MD   ceFAZolin (ANCEF) IVPB 2g/100 mL premix, 2 g, Intravenous, to XRAY, Boisseau, Hayley, PA   Chlorhexidine Gluconate Cloth 2 % PADS 6 each, 6 each, Topical, Daily, [name], MD, 6 each at 11/19/21 0740   clonazePAM (KLONOPIN) tablet 0.5 mg, 0.5 mg, Per Tube, TID PRN, Kipp Brood, MD, 0.5 mg at 11/19/21 1754   Darbepoetin Alfa (ARANESP) injection 100 mcg, 100 mcg, Intravenous, Q Tue-HD, [name], MD, 100 mcg at 11/19/21 1402   dextrose 50 % solution 0-50 mL, 0-50 mL, Intravenous, PRN, [name], MD   diphenhydrAMINE (BENADRYL) injection 25 mg, 25 mg, Intravenous, Q6H PRN, Laqueta Jean, MD, 25 mg at 11/08/21  0112   feeding supplement (PROSource TF20) liquid 60 mL, 60 mL, Per Tube, TID, Noemi Chapel P, DO, 60 mL at 11/19/21 1755   fentaNYL (DURAGESIC) 50 MCG/HR 1 patch, 1 patch, Transdermal, Q72H, Candee Furbish, MD, 1 patch at 11/18/21 1715   fiber supplement (BANATROL TF) liquid 60 mL, 60 mL, Per Tube, QID, Clark, Laura P, DO, 60 mL at 67/89/38 1017   folic acid (FOLVITE) tablet 1 mg, 1 mg, Per Tube, Daily, Jacky Kindle, MD, 1 mg at 11/18/21 1134   Gerhardt's butt cream, , Topical, Daily, Agarwala, Einar Grad, MD, Given at 11/19/21 1000   heparin injection 5,000 Units, 5,000 Units, Subcutaneous, Q8H, [name], DO, 5,000 Units at 11/20/21 0556   HYDROmorphone (DILAUDID) injection 1 mg, 1 mg, Intravenous, Q1H PRN, Jacky Kindle, MD, 1 mg at 11/16/21 0016   insulin aspart (novoLOG) injection 0-20 Units, 0-20 Units, Subcutaneous, Q4H, Reese, Stephanie M, PA-C, 3 Units at 11/19/21 1825   iohexol (OMNIPAQUE) 300 MG/ML solution 100 mL, 100 mL, Other, Once PRN, Corrie Mckusick, DO   ipratropium-albuterol (DUONEB) 0.5-2.5 (3) MG/3ML nebulizer solution 3 mL, 3 mL, Nebulization, Q4H PRN, Noemi Chapel P, DO, 3 mL at 10/16/21 1937   leptospermum manuka honey (MEDIHONEY) paste 1 Application, 1 Application, Topical, Daily, Bartle, Fernande Boyden, MD   lidocaine (LMX) 4 % cream, , Topical, TID PRN, Jeannie Fend, FNP, 1 Application at 51/02/58 1815  loperamide HCl (IMODIUM) 1 MG/7.5ML suspension 2 mg, 2 mg, Per Tube, PRN, [name], MD, 2 mg at 11/20/21 0300   midodrine (PROAMATINE) tablet 15 mg, 15 mg, Per Tube, TID WC, [name], MD, 15 mg at 11/20/21 0731   multivitamin (RENA-VIT) tablet 1 tablet, 1 tablet, Per Tube, QHS, [name], DO, 1 tablet at 11/19/21 2102   nutrition supplement (JUVEN) (JUVEN) powder packet 1 packet, 1 packet, Per Tube, BID BM, Kipp Brood, MD, 1 packet at 11/19/21 1756   ondansetron (ZOFRAN) injection 4 mg, 4 mg, Intravenous, Q6H PRN, [name], MD, 4 mg at 11/12/21  2322   Oral care mouth rinse, 15 mL, Mouth Rinse, 4 times per day, [name], MD, 15 mL at 11/19/21 2102   Oral care mouth rinse, 15 mL, Mouth Rinse, PRN, [name], MD, 15 mL at 10/22/21 0349   [EXPIRED] oxyCODONE (Oxy IR/ROXICODONE) immediate release tablet 10 mg, 10 mg, Per Tube, Q6H, 10 mg at 11/01/21 0537 **FOLLOWED BY** oxyCODONE (Oxy IR/ROXICODONE) immediate release tablet 5 mg, 5 mg, Per Tube, Q6H PRN, Omar Person, NP, 5 mg at 11/16/21 2128   pantoprazole (PROTONIX) 2 mg/mL oral suspension 40 mg, 40 mg, Per Tube, BID, [name], MD, 40 mg at 11/19/21 2102   polyvinyl alcohol (LIQUIFILM TEARS) 1.4 % ophthalmic solution 2 drop, 2 drop, Both Eyes, PRN, Bowser, Laurel Dimmer, NP, 2 drop at 11/16/21 0154   QUEtiapine (SEROQUEL) tablet 50 mg, 50 mg, Per Tube, QHS, Agarwala, Einar Grad, MD, 50 mg at 11/19/21 2102   sodium chloride flush (NS) 0.9 % injection 3 mL, 3 mL, Intravenous, Q12H, Bartle, Fernande Boyden, MD, 3 mL at 11/19/21 2102   sodium chloride flush (NS) 0.9 % injection 3 mL, 3 mL, Intravenous, PRN, [name], MD   thiamine (VITAMIN B1) tablet 100 mg, 100 mg, Per Tube, Daily, Jacky Kindle, MD, 100 mg at 11/18/21 1133   white petrolatum (VASELINE) gel, , Topical, PRN, Kipp Brood, MD    Vital Signs: BP (!) 103/59   Pulse 66   Temp 98.1 F (36.7 C) (Oral)   Resp 20   Ht 5\' 7"  (1.702 m)   Wt 194 lb 3.6 oz (88.1 kg)   SpO2 98%   BMI 30.42 kg/m   Physical Exam General: NAD ENT: NG tube in nare. Lungs: CTA without w/r/r Heart: Regular    Labs:  CBC: Recent Labs    11/15/21 0623 11/16/21 0400 11/19/21 0646 11/20/21 0009  WBC 9.5 8.5 9.0 8.2  HGB 9.2* 8.8* 9.0* 9.5*  HCT 30.3* 30.1* 29.4* 31.5*  PLT 425* 452* 434* 416*    COAGS: Recent Labs    10/06/21 1600 10/07/21 0408 10/07/21 1530 10/08/21 0350 10/15/21 1519 10/16/21 0407 10/21/21 0403 11/14/21 1659 11/19/21 1524 11/20/21 0009  INR  --  1.4* 1.6*   < > 1.3*   < > 1.3* 1.1 1.1 1.1   APTT 44* 46* 124*  --  35  --   --   --   --   --    < > = values in this interval not displayed.    BMP: Recent Labs    11/17/21 0936 11/18/21 0011 11/19/21 0646 11/20/21 0009  NA 135 135 135 134*  K 4.6 4.8 5.0 5.5*  CL 96* 94* 96* 95*  CO2 26 26 24 23   GLUCOSE 128* 83 114* 84  BUN 51* 76* 99* 84*  CALCIUM 9.0 9.1 9.2 9.2  CREATININE 4.22* 5.03* 6.65* 5.85*  GFRNONAA 15* 12* 9* 10*    LIVER FUNCTION TESTS: Recent Labs    10/05/21 0441 10/06/21 1600 10/07/21 0408 10/08/21 0350 10/22/21 0406 10/22/21 1529 11/17/21 0936 11/18/21 0011 11/19/21 0646 11/20/21 0009  BILITOT 1.0 2.2* 1.1  --  1.0  --   --   --   --   --   AST 28 99* 97*  --  38  --   --   --   --   --   ALT 11 10 15   --  22  --   --   --   --   --   ALKPHOS 37* 73 89  --  137*  --   --   --   --   --   PROT 3.7* 3.9* 4.1*  --  6.2*  --   --   --   --   --   ALBUMIN 2.0* 1.9* 1.9*   < > 2.4*  2.4*   < > 2.6* 2.6* 2.6* 2.8*   < > = values in this interval not displayed.    Assessment/Plan:  Dysphagia For perc Gastrostomy tube today as discussed. Procedure reviewed. Pt without any further questions.   SignedAscencion Dike 11/20/2021, 7:32 AM

## 2021-11-20 NOTE — Progress Notes (Addendum)
Patient ID: [name], male   DOB: December 21, 1960, 61 y.o.   MRN: [id number] S: No complaints.  To have PEG placement today. O:BP 119/69   Pulse 84   Temp (!) 97.4 F (36.3 C) (Oral)   Resp (!) 23   Ht 5\' 7"  (1.702 m)   Wt 88.1 kg   SpO2 95%   BMI 30.42 kg/m   Intake/Output Summary (Last 24 hours) at 11/20/2021 1038 Last data filed at 11/20/2021 0700 Gross per 24 hour  Intake 1579 ml  Output 2 ml  Net 1577 ml   Intake/Output: I/O last 3 completed shifts: In: 2179 [NG/GT:2179] Out: 2 [Other:2]  Intake/Output this shift:  No intake/output data recorded. Weight change: -0.012 kg Gen:NAD CVS: RRR Resp:CTA Abd: +BS, soft, NT/ND Ext: no edema  Recent Labs  Lab 11/14/21 0427 11/15/21 0623 11/16/21 0400 11/17/21 0936 11/18/21 0011 11/19/21 0646 11/20/21 0009  NA 131* 133* 132* 135 135 135 134*  K 4.6 4.6 4.7 4.6 4.8 5.0 5.5*  CL 91* 92* 93* 96* 94* 96* 95*  CO2 24 23 24 26 26 24 23   GLUCOSE 114* 81 115* 128* 83 114* 84  BUN 87* 72* 88* 51* 76* 99* 84*  CREATININE 5.21* 4.09* 5.48* 4.22* 5.03* 6.65* 5.85*  ALBUMIN 2.5* 2.9* 2.6* 2.6* 2.6* 2.6* 2.8*  CALCIUM 9.1 9.2 9.3 9.0 9.1 9.2 9.2  PHOS 4.2 3.2 4.7* 3.8 3.4 4.1 3.9   Liver Function Tests: Recent Labs  Lab 11/18/21 0011 11/19/21 0646 11/20/21 0009  ALBUMIN 2.6* 2.6* 2.8*   No results for input(s): "LIPASE", "AMYLASE" in the last 168 hours. No results for input(s): "AMMONIA" in the last 168 hours. CBC: Recent Labs  Lab 11/14/21 0427 11/15/21 0623 11/16/21 0400 11/19/21 0646 11/20/21 0009  WBC 10.6* 9.5 8.5 9.0 8.2  HGB 8.8* 9.2* 8.8* 9.0* 9.5*  HCT 29.3* 30.3* 30.1* 29.4* 31.5*  MCV 95.1 95.3 95.6 92.5 93.2  PLT 426* 425* 452* 434* 416*   Cardiac Enzymes: No results for input(s): "CKTOTAL", "CKMB", "CKMBINDEX", "TROPONINI" in the last 168 hours. CBG: Recent Labs  Lab 11/19/21 2000 11/19/21 2304 11/20/21 0323 11/20/21 0757 11/20/21 1014  GLUCAP 108* 88 85 79 81    Iron Studies: No results  for input(s): "IRON", "TIBC", "TRANSFERRIN", "FERRITIN" in the last 72 hours. Studies/Results: No results found.  [MAR Hold] sodium chloride   Intravenous Once   [MAR Hold] ascorbic acid  250 mg Per Tube BID   [MAR Hold] aspirin  81 mg Per Tube Daily   [MAR Hold] atorvastatin  80 mg Per Tube Daily   chlorhexidine       [MAR Hold] Chlorhexidine Gluconate Cloth  6 each Topical Daily   [MAR Hold] darbepoetin (ARANESP) injection - DIALYSIS  100 mcg Intravenous Q Tue-HD   [MAR Hold] feeding supplement (PROSource TF20)  60 mL Per Tube TID   [MAR Hold] fentaNYL  1 patch Transdermal Q72H   [MAR Hold] fiber supplement (BANATROL TF)  60 mL Per Tube QID   [MAR Hold] folic acid  1 mg Per Tube Daily   [MAR Hold] Gerhardt's butt cream   Topical Daily   [MAR Hold] heparin injection (subcutaneous)  5,000 Units Subcutaneous Q8H   [MAR Hold] insulin aspart  0-20 Units Subcutaneous Q4H   [MAR Hold] leptospermum manuka honey  1 Application Topical Daily   [MAR Hold] midodrine  15 mg Per Tube TID WC   [MAR Hold] multivitamin  1 tablet Per Tube QHS   [MAR Hold]  nutrition supplement (JUVEN)  1 packet Per Tube BID BM   [MAR Hold] mouth rinse  15 mL Mouth Rinse 4 times per day   [facility] Hold] pantoprazole  40 mg Per Tube BID   [MAR Hold] QUEtiapine  50 mg Per Tube QHS   [MAR Hold] sodium chloride flush  3 mL Intravenous Q12H   [MAR Hold] thiamine  100 mg Per Tube Daily    BMET    Component Value Date/Time   NA 134 (L) 11/20/2021 0009   NA 135 02/13/2021 0855   K 5.5 (H) 11/20/2021 0009   CL 95 (L) 11/20/2021 0009   CO2 23 11/20/2021 0009   GLUCOSE 84 11/20/2021 0009   BUN 84 (H) 11/20/2021 0009   BUN 9 02/13/2021 0855   CREATININE 5.85 (H) 11/20/2021 0009   CALCIUM 9.2 11/20/2021 0009   GFRNONAA 10 (L) 11/20/2021 0009   GFRAA >60 08/25/2019 2110   CBC    Component Value Date/Time   WBC 8.2 11/20/2021 0009   RBC 3.38 (L) 11/20/2021 0009   HGB 9.5 (L) 11/20/2021 0009   HGB 15.4 02/13/2021 0855    HCT 31.5 (L) 11/20/2021 0009   HCT 45.7 02/13/2021 0855   PLT 416 (H) 11/20/2021 0009   PLT 239 02/13/2021 0855   MCV 93.2 11/20/2021 0009   MCV 88 02/13/2021 0855   MCH 28.1 11/20/2021 0009   MCHC 30.2 11/20/2021 0009   RDW 17.4 (H) 11/20/2021 0009   RDW 11.3 (L) 02/13/2021 0855   LYMPHSABS 2.2 09/29/2021 1956   MONOABS 2.1 (H) 09/29/2021 1956   EOSABS 0.0 09/29/2021 1956   BASOSABS 0.0 09/29/2021 1956    Assessment/Plan:   ESRD - following ischemic ATN in setting of cardiogenic shock/cardiac arrest.  Started CRRT on 10/07/21 and transitioned to IHD on 10/31/21.  Currently on TTS schedule.  Outpatient dialysis arrangements pending [facility] financial clearance.   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  H/o ECMO.  Currently on midodrine 20 mg tid. Anemia of critical illness with ABLA - on ESA and IV iron CAD - s/p CABG x 5 vessels on 09/12/21 F/E/N - awaiting PEG placement under GA today. Unstageable pressure ulcer of left buttock - wound care following. Hyperkalemia - unclear why his K is elevated since he had HD yesterday.  Will give Lokelma and temporize until he can have HD tomorrow.  Need to make sure his tube feeds are low K and phos.   Donetta Potts, MD [facility]

## 2021-11-20 NOTE — Progress Notes (Signed)
OT Cancellation Note  Patient Details Name: [name] MRN: [id number] DOB: Sep 28, 1960   Cancelled Treatment:    Reason Eval/Treat Not Completed: Patient at procedure or test/ unavailable (OT to f/u when pt is available.)  Elliot Cousin 11/20/2021, 10:22 AM

## 2021-11-20 NOTE — Progress Notes (Signed)
PT Cancellation Note  Patient Details Name: ADRIK KHIM MRN: [id number] DOB: Jun 29, 1960   Cancelled Treatment:    Reason Eval/Treat Not Completed: Patient at procedure or test/unavailable;Other (comment) (pt off unit for procedure in AM, pt declining all mobility in PM stating need for rest despite max encouragement and education.) Will re-attempt tomorrow to continue with PT POC.  Audry Riles. PTA Acute Rehabilitation Services Office: [place] 11/20/2021, 2:17 PM

## 2021-11-20 NOTE — Transfer of Care (Signed)
Immediate Anesthesia Transfer of Care Note  Patient: [name]  Procedure(s) Performed: G-Tube Placement  Patient Location: PACU  Anesthesia Type:General  Level of Consciousness: awake and drowsy  Airway & Oxygen Therapy: Patient Spontanous Breathing and Patient connected to face mask oxygen  Post-op Assessment: Report given to RN and Post -op Vital signs reviewed and stable  Post vital signs: Reviewed and stable  Last Vitals:  Vitals Value Taken Time  BP    Temp    Pulse 83 11/20/21 1344  Resp    SpO2 92 % 11/20/21 1344  Vitals shown include unvalidated device data.  Last Pain:  Vitals:   11/20/21 1315  TempSrc:   PainSc: 0-No pain      Patients Stated Pain Goal: 2 (71/21/97 5883)  Complications: No notable events documented.

## 2021-11-20 NOTE — Progress Notes (Signed)
IV team consulted for PIV assessment/placement.  Upon arrival, pt needs to use restroom first and wants an assessment from his RN.  Sent RN secure chat to let me know when pt is ready.

## 2021-11-21 ENCOUNTER — Encounter (HOSPITAL_COMMUNITY): Payer: Self-pay | Admitting: Interventional Radiology

## 2021-11-21 LAB — CBC
HCT: 32.5 % — ABNORMAL LOW (ref 39.0–52.0)
Hemoglobin: 9.5 g/dL — ABNORMAL LOW (ref 13.0–17.0)
MCH: 28 pg (ref 26.0–34.0)
MCHC: 29.2 g/dL — ABNORMAL LOW (ref 30.0–36.0)
MCV: 95.9 fL (ref 80.0–100.0)
Platelets: 442 10*3/uL — ABNORMAL HIGH (ref 150–400)
RBC: 3.39 MIL/uL — ABNORMAL LOW (ref 4.22–5.81)
RDW: 17.1 % — ABNORMAL HIGH (ref 11.5–15.5)
WBC: 7.4 10*3/uL (ref 4.0–10.5)
nRBC: 0.3 % — ABNORMAL HIGH (ref 0.0–0.2)

## 2021-11-21 LAB — RENAL FUNCTION PANEL
Albumin: 2.9 g/dL — ABNORMAL LOW (ref 3.5–5.0)
Albumin: 3 g/dL — ABNORMAL LOW (ref 3.5–5.0)
Anion gap: 18 — ABNORMAL HIGH (ref 5–15)
Anion gap: 20 — ABNORMAL HIGH (ref 5–15)
BUN: 111 mg/dL — ABNORMAL HIGH (ref 8–23)
BUN: 119 mg/dL — ABNORMAL HIGH (ref 8–23)
CO2: 23 mmol/L (ref 22–32)
CO2: 21 mmol/L — ABNORMAL LOW (ref 22–32)
Calcium: 9.7 mg/dL (ref 8.9–10.3)
Calcium: 9.7 mg/dL (ref 8.9–10.3)
Chloride: 92 mmol/L — ABNORMAL LOW (ref 98–111)
Chloride: 93 mmol/L — ABNORMAL LOW (ref 98–111)
Creatinine, Ser: 7.29 mg/dL — ABNORMAL HIGH (ref 0.61–1.24)
Creatinine, Ser: 7.87 mg/dL — ABNORMAL HIGH (ref 0.61–1.24)
GFR, Estimated: 8 mL/min — ABNORMAL LOW (ref >60)
GFR, Estimated: 7 mL/min — ABNORMAL LOW (ref >60)
Glucose, Bld: 118 mg/dL — ABNORMAL HIGH (ref 70–99)
Glucose, Bld: 90 mg/dL (ref 70–99)
Phosphorus: 5.6 mg/dL — ABNORMAL HIGH (ref 2.5–4.6)
Phosphorus: 6.2 mg/dL — ABNORMAL HIGH (ref 2.5–4.6)
Potassium: 6.8 mmol/L (ref 3.5–5.1)
Potassium: 6.5 mmol/L (ref 3.5–5.1)
Sodium: 133 mmol/L — ABNORMAL LOW (ref 135–145)
Sodium: 134 mmol/L — ABNORMAL LOW (ref 135–145)

## 2021-11-21 LAB — GLUCOSE, CAPILLARY
Glucose-Capillary: 103 mg/dL — ABNORMAL HIGH (ref 70–99)
Glucose-Capillary: 94 mg/dL (ref 70–99)
Glucose-Capillary: 71 mg/dL (ref 70–99)
Glucose-Capillary: 89 mg/dL (ref 70–99)
Glucose-Capillary: 114 mg/dL — ABNORMAL HIGH (ref 70–99)
Glucose-Capillary: 91 mg/dL (ref 70–99)

## 2021-11-21 LAB — MAGNESIUM: Magnesium: 2.1 mg/dL (ref 1.7–2.4)

## 2021-11-21 MED ORDER — HEPARIN SODIUM (PORCINE) 1000 UNIT/ML DIALYSIS: 20.0000 [IU]/kg | INTRAMUSCULAR | Status: DC | PRN

## 2021-11-21 MED ORDER — SODIUM ZIRCONIUM CYCLOSILICATE 10 G PO PACK
10.0000 g | PACK | Freq: Once | ORAL | Status: AC
  Administered 2021-11-21: 10 g
  Filled 2021-11-21: qty 1

## 2021-11-21 MED ORDER — NEPRO/CARBSTEADY PO LIQD
237.0000 mL | Freq: Every day | ORAL | Status: DC
  Administered 2021-11-21 – 2021-11-22 (×3): 237 mL

## 2021-11-21 MED ORDER — HEPARIN SODIUM (PORCINE) 1000 UNIT/ML IJ SOLN
INTRAMUSCULAR | Status: AC
  Filled 2021-11-21: qty 4

## 2021-11-21 MED ORDER — HEPARIN SODIUM (PORCINE) 1000 UNIT/ML IJ SOLN
INTRAMUSCULAR | Status: AC
  Filled 2021-11-21: qty 2

## 2021-11-21 NOTE — Progress Notes (Signed)
SLP Cancellation Note  Patient Details Name: [name] MRN: [id number] DOB: 12/08/1960   Cancelled treatment:       Reason Eval/Treat Not Completed: Patient at procedure or test/unavailable. Pt leaving for HD. Will continue efforts, hoping to complete MBS as soon as able.     Osie Bond., M.A. [place] Office [phone]  Secure chat preferred  11/21/2021, 8:52 AM

## 2021-11-21 NOTE — Progress Notes (Signed)
Received call from lab with critical k+ level of 6.5. Spoke with provider and received order for diaysate bath of 1k+ /2.5Ca x 1 hour and then change back to 2k+/2.5Ca.Marland Kitchen

## 2021-11-21 NOTE — Progress Notes (Addendum)
      [address]       [place] [address]             [phone]       1 Day Post-Op Procedure(s) (LRB): G-Tube Placement (N/A) Subjective: Patient states he did not sleep well due to sacral pressure sore and has continues to have some pain where they placed the G-tube.  Objective: Vital signs in last 24 hours: Temp:  [97.4 F (36.3 C)-98.8 F (37.1 C)] 97.9 F (36.6 C) (10/12 0343) Pulse Rate:  [80-84] 83 (10/12 0012) Cardiac Rhythm: Normal sinus rhythm;Bundle branch block (10/12 0700) Resp:  [18-28] 26 (10/12 0012) BP: (86-154)/(65-94) 136/71 (10/12 0343) SpO2:  [95 %-100 %] 100 % (10/11 1614) Weight:  [88 kg] 88 kg (10/12 0505)  Hemodynamic parameters for last 24 hours:    Intake/Output from previous day: 10/11 0701 - 10/12 0700 In: 700 [I.V.:450; IV Piggyback:250] Out: 22 [Urine:20; Blood:2] Intake/Output this shift: No intake/output data recorded.  General appearance: alert, cooperative, and no distress Neurologic: intact Heart: regular rate and rhythm, S1, S2 normal, no murmur, click, rub or gallop Lungs: clear to auscultation bilaterally Abdomen: soft, non-tender; bowel sounds normal; no masses,  no organomegaly Extremities: extremities normal, atraumatic, no cyanosis or edema Wound: Chest dressing and sacral dressing clean and dry  Lab Results: Recent Labs    11/19/21 0646 11/20/21 0009  WBC 9.0 8.2  HGB 9.0* 9.5*  HCT 29.4* 31.5*  PLT 434* 416*   BMET:  Recent Labs    11/20/21 0009 11/21/21 0013  NA 134* 133*  K 5.5* 6.8*  CL 95* 92*  CO2 23 23  GLUCOSE 84 118*  BUN 84* 111*  CREATININE 5.85* 7.29*  CALCIUM 9.2 9.7    PT/INR:  Recent Labs    11/20/21 0009  LABPROT 14.4  INR 1.1   ABG    Component Value Date/Time   PHART 7.301 (L) 10/22/2021 2354   HCO3 19.3 (L) 10/22/2021 2354   TCO2 20 (L) 10/22/2021 2354   ACIDBASEDEF 7.0 (H) 10/22/2021 2354   O2SAT 71.7 11/02/2021 0439   CBG (last 3)  Recent Labs     11/20/21 2008 11/20/21 2322 11/21/21 0326  GLUCAP 137* 127* 103*    Assessment/Plan: S/P Procedure(s) (LRB): G-Tube Placement (N/A)  CV: NSR, rate 83. BP labile, SBP 86-136. Some HTN yesterday, on midodrine TID on HD days only continue to wean as able. Pulm: Saturating 100% on RA.  GI: Some pain at G-tube site, no loose stools. Currently NPO, will plan to resume bolus tube feeds this afternoon. Possible MBS per ST on Friday.  Endo: T2DM, sugars controlled. CBGs 137/127/103. Renal: ESRD on dialysis, HD today. K 6.8 this AM. Was given Lokelma. ID: Continue dressing changes at sternal and sacral wound sites. WBC 8.2, no sign of infection. Deconditioning: Continue PT/OT Dispo: Stable, currently has multiple issues. Hopefully can d/c to SNF or LTAC, pt awaiting insurance    LOS: 65 days    Mark River, PA-C 11/21/2021   Chart reviewed, patient examined, agree with above. He is doing ok this afternoon after HD but has had some more loose stool after tube feeds restarted. Hopefully he can pass MBS and get on po diet. He has approval to go to SNF but I would like to get the oral diet issue resolved before he goes anywhere.

## 2021-11-21 NOTE — Progress Notes (Signed)
S/p perc G-tube placement yesterday. No complications. Pt seen in HD BP 137/77   Pulse 81   Temp 97.9 F (36.6 C) (Oral)   Resp (!) 23   Ht 5\' 7"  (1.702 m)   Wt 194 lb 0.1 oz (88 kg)   SpO2 100%   BMI 30.39 kg/m  G-tube site looks good. Site clean, no bleeding or leakage.  Ok to begin using for TF as well as meds. Call IR if tube related issues.  Ascencion Dike PA-C Interventional Radiology 11/21/2021 10:56 AM

## 2021-11-21 NOTE — Anesthesia Postprocedure Evaluation (Signed)
Anesthesia Post Note  Patient: [name]  Procedure(s) Performed: G-Tube Placement     Patient location during evaluation: PACU Anesthesia Type: General Level of consciousness: awake Pain management: pain level controlled Vital Signs Assessment: post-procedure vital signs reviewed and stable Respiratory status: spontaneous breathing Cardiovascular status: stable Postop Assessment: no apparent nausea or vomiting Anesthetic complications: no   No notable events documented.  Last Vitals:  Vitals:   11/21/21 1343 11/21/21 1501  BP: 132/82 117/75  Pulse: 98 95  Resp: 15   Temp: 36.8 C 37.2 C  SpO2: 98% 96%    Last Pain:  Vitals:   11/21/21 1501  TempSrc: Oral  PainSc: Asleep                 [name]

## 2021-11-21 NOTE — TOC CM/SW Note (Signed)
HF TOC CM discussed pt's case with TOC CM Director, Nathaniel Man, pt was approved for letter of guarantee for SNF. Updated attending. [place], Heart Failure TOC CM [phone]

## 2021-11-21 NOTE — Procedures (Signed)
I was present at this dialysis session. I have reviewed the session itself and made appropriate changes.  K elevated at 6.5.  will ask if his tube feeds can be changed to [place].  Vital signs in last 24 hours:  Temp:  [97.9 F (36.6 C)-98.8 F (37.1 C)] 97.9 F (36.6 C) (10/12 0343) Pulse Rate:  [80-88] 88 (10/12 1030) Resp:  [15-28] 15 (10/12 1030) BP: (86-154)/(69-94) 120/70 (10/12 1030) SpO2:  [96 %-100 %] 100 % (10/11 1614) Weight:  [88 kg] 88 kg (10/12 0505) Weight change: -1.8 kg Filed Weights   11/19/21 0950 11/20/21 0536 11/21/21 0505  Weight: 89.8 kg 88.1 kg 88 kg    Recent Labs  Lab 11/21/21 0940  NA 134*  K 6.5*  CL 93*  CO2 21*  GLUCOSE 90  BUN 119*  CREATININE 7.87*  CALCIUM 9.7  PHOS 6.2*    Recent Labs  Lab 11/19/21 0646 11/20/21 0009 11/21/21 0940  WBC 9.0 8.2 7.4  HGB 9.0* 9.5* 9.5*  HCT 29.4* 31.5* 32.5*  MCV 92.5 93.2 95.9  PLT 434* 416* 442*    Scheduled Meds:  sodium chloride   Intravenous Once   ascorbic acid  250 mg Per Tube BID   aspirin  81 mg Per Tube Daily   atorvastatin  80 mg Per Tube Daily   Chlorhexidine Gluconate Cloth  6 each Topical Daily   darbepoetin (ARANESP) injection - DIALYSIS  100 mcg Intravenous Q Tue-HD   feeding supplement (PROSource TF20)  60 mL Per Tube TID   fentaNYL  1 patch Transdermal Q72H   fiber supplement (BANATROL TF)  60 mL Per Tube QID   folic acid  1 mg Per Tube Daily   Gerhardt's butt cream   Topical Daily   heparin injection (subcutaneous)  5,000 Units Subcutaneous Q8H   heparin sodium (porcine)       insulin aspart  0-20 Units Subcutaneous Q4H   leptospermum manuka honey  1 Application Topical Daily   midodrine  10 mg Oral 3 times per day on Tue Thu Sat   multivitamin  1 tablet Per Tube QHS   nutrition supplement (JUVEN)  1 packet Per Tube BID BM   mouth rinse  15 mL Mouth Rinse 4 times per day   pantoprazole  40 mg Per Tube BID   QUEtiapine  50 mg Per Tube QHS   sodium chloride flush  3 mL  Intravenous Q12H   thiamine  100 mg Per Tube Daily   Continuous Infusions:  sodium chloride Stopped (11/09/21 0715)   sodium chloride Stopped (11/04/21 1021)   albumin human 12.5 g (11/01/21 2248)   PRN Meds:.sodium chloride, acetaminophen (TYLENOL) oral liquid 160 mg/5 mL, albumin human, camphor-menthol, clonazePAM, dextrose, diphenhydrAMINE, heparin, heparin sodium (porcine), HYDROmorphone (DILAUDID) injection, iohexol, ipratropium-albuterol, lidocaine, loperamide HCl, ondansetron (ZOFRAN) IV, mouth rinse, [EXPIRED] oxyCODONE **FOLLOWED BY** oxyCODONE, polyvinyl alcohol, sodium chloride flush, white petrolatum   Donetta Potts,  MD 11/21/2021, 11:17 AM

## 2021-11-21 NOTE — Progress Notes (Addendum)
PT Cancellation Note  Patient Details Name: [name] MRN: [id number] DOB: 1960-04-03   Cancelled Treatment:    Reason Eval/Treat Not Completed: Patient at procedure or test/unavailable, pt off unit at HD. Will check back as schedule allows to continue with PT POC.  15:29: Re-attempted, however pt stating fatigue post HD, requesting to rest and requesting this PTA come back later in evening. Will check back as schedule allows and continue to follow acutely.   Audry Riles. PTA Acute Rehabilitation Services Office: [place] 11/21/2021, 10:39 AM

## 2021-11-21 NOTE — NC FL2 (Cosign Needed Addendum)
[place] LEVEL OF CARE SCREENING TOOL     IDENTIFICATION  Patient Name: [name] Birthdate: 01-27-1961 Sex: male Admission Date (Current Location): 09/28/2021  [facility] and [place] Number:  Herbalist and Address:  [facility] [place] [address], [place], [place] [address]      Provider Number: [id number]  Attending Physician Name and Address:  [name], MD  Relative Name and Phone Number:  Johathan, [place] (Spouse)   [phone] (Mobile)    Current Level of Care: Hospital Recommended Level of Care: [place] Prior Approval Number:    Date Approved/Denied:   PASRR Number: [id number]  Discharge Plan: SNF    Current Diagnoses: Patient Active Problem List   Diagnosis Date Noted   Physical deconditioning    Dysphagia    Acute metabolic encephalopathy    Acute anemia    Pressure injury of skin 10/13/2021   Cardiogenic shock ([place]) 10/12/2021   Pericarditis    Acute respiratory failure with hypoxia (HCC)    Atypical atrial flutter (HCC)    Personal history of ECMO 10/07/2021   Cardiac arrest ([place])    Cardiomyopathy ([place]) 09/28/2021   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   Transient neurologic deficit    Aphasia    Dysphasia 03/12/2021   Current use of insulin ([place]) 03/12/2021   Encephalomalacia    History of cerebral embolism with cerebral infarction 12/28/2020   AKI (acute kidney injury) ([place])    History of MI (myocardial infarction) 03/19/2018   Status 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 3 03/23/2015   Diabetes mellitus type 2 with neurological manifestations ([place])    Tobacco abuse    Obesity    Hypertension associated with diabetes ([place]) 03/16/2015   Hyperlipidemia associated with type 2 diabetes mellitus ([place]) 03/16/2015    Orientation RESPIRATION BLADDER Height & Weight     Self, Situation, Place, Time  Room air Continent Weight: 191  lb 12.8 oz (87 kg) Height:  5\' 7"  (170.2 cm)  BEHAVIORAL SYMPTOMS/MOOD NEUROLOGICAL BOWEL NUTRITION STATUS      Continent Feeding tube, Diet (G-tube, see DC summary)  AMBULATORY STATUS COMMUNICATION OF NEEDS Skin   Extensive Assist Verbally Other (Comment), PU Stage and Appropriate Care, Surgical wounds (Non-pressure wound, right arm; incision, mid chest; incision, pretibial right; pressure injury, sacrum, mid)                       Personal Care Assistance Level of Assistance  Bathing, Feeding, Dressing Bathing Assistance: Maximum assistance Feeding assistance: Independent Dressing Assistance: Limited assistance     Functional Limitations Info  Sight, Speech, Hearing Sight Info: Adequate Hearing Info: Adequate Speech Info: Adequate    SPECIAL CARE FACTORS FREQUENCY  PT (By licensed PT), OT (By licensed OT)     PT Frequency: 5x/week OT Frequency: 5x/week            Contractures Contractures Info: Not present    Additional Factors Info  Code Status, Allergies Code Status Info: full code Allergies Info: no known allergies           Current Medications (11/21/2021):  This is the current hospital active medication list Current Facility-Administered Medications  Medication Dose Route Frequency Provider Last Rate Last Admin   0.9 %  sodium chloride infusion (Manually program via Guardrails IV Fluids)   Intravenous Once Lestine Mount, PA-C  0.9 %  sodium chloride infusion   Intravenous PRN [name], MD   Stopped at 11/09/21 0715   0.9 %  sodium chloride infusion  250 mL Intravenous Continuous [name], MD   Paused at 11/04/21 1021   acetaminophen (TYLENOL) 160 MG/5ML solution 650 mg  650 mg Per Tube Q6H PRN Sabharwal, Aditya, DO   650 mg at 11/16/21 1418   albumin human 5 % solution 12.5 g  12.5 g Intravenous PRN Elmarie Shiley, MD 60 mL/hr at 11/01/21 2248 12.5 g at 11/01/21 2248   ascorbic acid (VITAMIN C) tablet 250 mg  250 mg Per Tube BID  Jacky Kindle, MD   250 mg at 11/20/21 2056   aspirin chewable tablet 81 mg  81 mg Per Tube Daily [name], MD   81 mg at 11/18/21 1134   atorvastatin (LIPITOR) tablet 80 mg  80 mg Per Tube Daily [name], MD   80 mg at 11/18/21 1133   camphor-menthol (SARNA) lotion   Topical PRN Laqueta Jean, MD       Chlorhexidine Gluconate Cloth 2 % PADS 6 each  6 each Topical Daily [name], MD   6 each at 11/20/21 0900   clonazePAM (KLONOPIN) tablet 0.5 mg  0.5 mg Per Tube TID PRN Kipp Brood, MD   0.5 mg at 11/20/21 2056   Darbepoetin Alfa (ARANESP) injection 100 mcg  100 mcg Intravenous Q Tue-HD [name], MD   100 mcg at 11/19/21 1402   dextrose 50 % solution 0-50 mL  0-50 mL Intravenous PRN [name], MD       diphenhydrAMINE (BENADRYL) injection 25 mg  25 mg Intravenous Q6H PRN Laqueta Jean, MD   25 mg at 11/08/21 0112   feeding supplement (NEPRO CARB STEADY) liquid 237 mL  237 mL Per Tube 5 X Daily [name], MD       feeding supplement (PROSource TF20) liquid 60 mL  60 mL Per Tube TID Noemi Chapel P, DO   60 mL at 11/20/21 2055   fentaNYL (DURAGESIC) 50 MCG/HR 1 patch  1 patch Transdermal Q72H Candee Furbish, MD   1 patch at 11/18/21 1715   fiber supplement (BANATROL TF) liquid 60 mL  60 mL Per Tube QID Noemi Chapel P, DO   60 mL at 92/11/94 1740   folic acid (FOLVITE) tablet 1 mg  1 mg Per Tube Daily Jacky Kindle, MD   1 mg at 11/18/21 1134   Gerhardt's butt cream   Topical Daily Agarwala, Einar Grad, MD   Given at 11/20/21 0930   heparin injection 1,800 Units  20 Units/kg Dialysis PRN Donato Heinz, MD       heparin injection 5,000 Units  5,000 Units Subcutaneous Q8H [name], MD   5,000 Units at 11/21/21 0700   heparin sodium (porcine) 1000 UNIT/ML injection            heparin sodium (porcine) 1000 UNIT/ML injection            HYDROmorphone (DILAUDID) injection 1 mg  1 mg Intravenous Q1H PRN Jacky Kindle, MD   1 mg at 11/16/21 0016    insulin aspart (novoLOG) injection 0-20 Units  0-20 Units Subcutaneous Q4H [place] Crane M, PA-C   3 Units at 11/21/21 0007   iohexol (OMNIPAQUE) 300 MG/ML solution 100 mL  100 mL Other Once PRN Corrie Mckusick, DO       ipratropium-albuterol (  DUONEB) 0.5-2.5 (3) MG/3ML nebulizer solution 3 mL  3 mL Nebulization Q4H PRN Noemi Chapel P, DO   3 mL at 10/16/21 1937   leptospermum manuka honey (MEDIHONEY) paste 1 Application  1 Application Topical Daily [name], MD   1 Application at 70/48/88 0930   lidocaine (LMX) 4 % cream   Topical TID PRN Jeannie Fend, FNP   1 Application at 91/69/45 1815   loperamide HCl (IMODIUM) 1 MG/7.5ML suspension 2 mg  2 mg Per Tube PRN [name], MD   2 mg at 11/20/21 0300   midodrine (PROAMATINE) tablet 10 mg  10 mg Oral 3 times per day on Tue Thu Sat [name], MD   10 mg at 11/21/21 0700   multivitamin (RENA-VIT) tablet 1 tablet  1 tablet Per Tube QHS [name], DO   1 tablet at 11/20/21 2056   nutrition supplement (JUVEN) (JUVEN) powder packet 1 packet  1 packet Per Tube BID BM Kipp Brood, MD   1 packet at 11/20/21 1336   ondansetron (ZOFRAN) injection 4 mg  4 mg Intravenous Q6H PRN [name], MD   4 mg at 11/12/21 2322   Oral care mouth rinse  15 mL Mouth Rinse 4 times per day [name], MD   15 mL at 11/21/21 0730   Oral care mouth rinse  15 mL Mouth Rinse PRN [name], MD   15 mL at 10/22/21 0349   oxyCODONE (Oxy IR/ROXICODONE) immediate release tablet 5 mg  5 mg Per Tube Q6H PRN Omar Person, NP   5 mg at 11/16/21 2128   pantoprazole (PROTONIX) 2 mg/mL oral suspension 40 mg  40 mg Per Tube BID [name], MD   40 mg at 11/20/21 2056   polyvinyl alcohol (LIQUIFILM TEARS) 1.4 % ophthalmic solution 2 drop  2 drop Both Eyes PRN Cristal Generous, NP   2 drop at 11/16/21 0154   QUEtiapine (SEROQUEL) tablet 50 mg  50 mg Per Tube QHS Kipp Brood, MD   50 mg at 11/20/21 2055   sodium chloride flush (NS) 0.9 %  injection 3 mL  3 mL Intravenous Q12H [name], MD   3 mL at 11/20/21 2057   sodium chloride flush (NS) 0.9 % injection 3 mL  3 mL Intravenous PRN [name], MD       thiamine (VITAMIN B1) tablet 100 mg  100 mg Per Tube Daily Jacky Kindle, MD   100 mg at 11/18/21 1133   white petrolatum (VASELINE) gel   Topical PRN Kipp Brood, MD         Discharge Medications: Please see discharge summary for a list of discharge medications.  Relevant Imaging Results:  Relevant Lab Results:   Additional Information SSN [id number] Outpatient HD at [facility]) on TTS 12:40 chair time  [employer], LCSW

## 2021-11-21 NOTE — Progress Notes (Addendum)
Patient ID: [name], male   DOB: 1960-07-06, 61 y.o.   MRN: [id number]   Advanced Heart Failure Rounding Note   Subjective:    - 8/19 Pericardial window - 8/20 Cardiac arrest with tamponade -> Emergent bedside washout - 09/29/21 VA Cannulation - 09/30/21 Return to OR for mediastinal hemorrhage - 10/01/21 Developed AF -> amio - 10/02/21 TEE EF 25-30%  - 10/04/21 OR for washout. C/b continued bleeding overnight - 10/07/21 Placement of Impella 5.5 with washout, VA ECMO decannulation. Hypotensive with development of severe RV dysfunction after ECMO off and pressors titrated up. Multiple units of blood products. - 10/08/21 Brief PEA arrest. AFL with RVR >> S/p DCCV to SR, back in AFL shortly after - 8/31 Give 1UPRBCs  - 9/2 OR for chest closure - 9/3 Hypotensive overnight w/ SBPs in 80s. Febrile, mTemp 100.8. CRRT paused. VP increased to 0.04.  - 9/4 s/p bronchoscopy w/ BAL by PCCM, Cx NGTD  - 9/5 vomiting w/ large volume NGT output + watery/foul diarrhea, Tube feeds held. No signs of ileus on KUB. C-diff negative   - 9/7 OR for Impella Extraction and percutaneous tracheostomy. 2 u RBCs. - 9/8 CVVH stopped and line removed for holiday - 9/9 CVVH restarted.  -9/13 Worsening leukocytosis and pressor requirements. Purulent drainage from arterial line. Started Daptomycin, Micafungin and Meropenem. - 9/17 Daptomycin + Micafungin stopped. Remains on Meropenum  - 9/21 transitioned to iHD  - 9/25 Tunneled HD cath placed. Trach decannulation - 9/26 Hgb 6.1>>transfused 2uRBC   Underwent G-tube placement yesterday. Restarting tube feeds this afternoon. Reports some discomfort at G-tube insertion site but starting to improve.  K up to 6.8 this am. Sample not hemolyzed. Has lokelma ordered and going for HD today.   SBP 130s-140s. Weaning midodrine.   No sacral wound pain. Notes itching.   Objective:     Vital Signs:   Temp:  [97.4 F (36.3 C)-98.8 F (37.1 C)] 97.9 F (36.6 C) (10/12  0343) Pulse Rate:  [80-84] 83 (10/12 0012) Resp:  [18-28] 26 (10/12 0012) BP: (86-154)/(65-94) 136/71 (10/12 0343) SpO2:  [95 %-100 %] 100 % (10/11 1614) Weight:  [88 kg] 88 kg (10/12 0505) Last BM Date : 11/20/21  Weight change: Filed Weights   11/19/21 0950 11/20/21 0536 11/21/21 0505  Weight: 89.8 kg 88.1 kg 88 kg    Intake/Output:   Intake/Output Summary (Last 24 hours) at 11/21/2021 0701 Last data filed at 11/20/2021 1900 Gross per 24 hour  Intake 700 ml  Output 22 ml  Net 678 ml   Physical Exam:  General:  Chronically ill appearing. No distress. HEENT: normal Neck: supple. no JVD. Carotids 2+ bilat; no bruits.  Cor: PMI nondisplaced. Regular rate & rhythm. No rubs, gallops or murmurs. Lungs: clear Abdomen: soft, nondistended, dressing over G-tube insertion site Extremities: no cyanosis, clubbing, rash, edema Neuro: alert & orientedx3, cranial nerves grossly intact. moves all 4 extremities w/o difficulty. Affect pleasant   Telemetry: SR 70s-80s  Labs: Basic Metabolic Panel: Recent Labs  Lab 11/17/21 0936 11/18/21 0011 11/19/21 0646 11/20/21 0009 11/21/21 0013  NA 135 135 135 134* 133*  K 4.6 4.8 5.0 5.5* 6.8*  CL 96* 94* 96* 95* 92*  CO2 _0 GLUCOSE 128* 83 114* 84 118*  BUN 51* 76* 99* 84* 111*  CREATININE 4.22* 5.03* 6.65* 5.85* 7.29*  CALCIUM 9.0 9.1 9.2 9.2 9.7  MG 1.7 1.8 2.0 1.8 2.1  PHOS 3.8 3.4 4.1 3.9 5.6*  Liver Function Tests: Recent Labs  Lab 11/17/21 0936 11/18/21 0011 11/19/21 0646 11/20/21 0009 11/21/21 0013  ALBUMIN 2.6* 2.6* 2.6* 2.8* 2.9*   No results for input(s): "LIPASE", "AMYLASE" in the last 168 hours. No results for input(s): "AMMONIA" in the last 168 hours.  CBC: Recent Labs  Lab 11/15/21 0623 11/16/21 0400 11/19/21 0646 11/20/21 0009  WBC 9.5 8.5 9.0 8.2  HGB 9.2* 8.8* 9.0* 9.5*  HCT 30.3* 30.1* 29.4* 31.5*  MCV 95.3 95.6 92.5 93.2  PLT 425* 452* 434* 416*    Cardiac Enzymes: No results  for input(s): "CKTOTAL", "CKMB", "CKMBINDEX", "TROPONINI" in the last 168 hours.   BNP: BNP (last 3 results) Recent Labs    12/24/20 0651 09/28/21 0405  BNP 412.0* 826.6*    ProBNP (last 3 results) No results for input(s): "PROBNP" in the last 8760 hours.    Other results:  Imaging: IR GASTROSTOMY TUBE MOD SED  Result Date: 11/20/2021 INDICATION: Recent CABG, now with dysphagia. Please perform percutaneous gastrostomy tube placement for enteric nutrition supplementation purposes. Patient underwent attempted percutaneous gastrostomy tube placement on 11/15/2021 however was unable to tolerate the procedure and as such returns today for the procedure was performed with general anesthesia. EXAM: PUSH GASTROSTOMY TUBE PLACEMENT COMPARISON:  Attempted image guided percutaneous gastrostomy tube placement-11/15/2021; chest CT-09/28/2021 MEDICATIONS: Ancef 2 gm IV; Antibiotics were administered within 1 hour of the procedure. CONTRAST:  20 cc Omnipaque 300 administered into the gastric lumen. ANESTHESIA/SEDATION: General anesthesia FLUOROSCOPY TIME:  1 minute, 18 seconds (35 mGy) COMPLICATIONS: None immediate. PROCEDURE: Informed written consent was obtained from the family following explanation of the procedure, risks, benefits and alternatives. A time out was performed prior to the initiation of the procedure. Ultrasound scanning was performed to demarcate the edge of the left lobe of the liver. Attention was made to avoid the healing incisions from previous mediastinal drains as a sequela of recent CABG. Maximal barrier sterile technique utilized including caps, mask, sterile gowns, sterile gloves, large sterile drape, hand hygiene and Betadine prep. The left upper quadrant was sterilely prepped and draped. A oral gastric catheter was inserted into the stomach under fluoroscopy. The existing nasogastric feeding tube was removed. The left costal margin and barium opacified transverse colon were  identified and avoided. Air was injected into the stomach for insufflation and visualization under fluoroscopy. Under sterile conditions and local anesthesia, 3 T tacks were utilized to pexy the anterior aspect of the stomach against the ventral abdominal wall. Contrast injection confirmed appropriate positioning of each of the T tacks. An incision was made between the T tacks and a 17 gauge trocar needle was utilized to access the stomach. Needle position was confirmed within the stomach with aspiration of air and injection of a small amount of contrast. A stiff Glidewire was advanced into the gastric lumen and under intermittent fluoroscopic guidance, the access needle was exchanged for a Kumpe catheter. With the use of the Kumpe catheter, a stiff Glidewire was advanced into the horizontal segment of the duodenum. Under intermittent fluoroscopic guidance, the Kumpe catheter was exchanged for a telescoping peel-away sheath, ultimately allowing placement of a 18-French balloon retention gastrostomy tube. The retention balloon was insufflated with a mixture of dilute saline and contrast and pulled taut against the anterior wall of the stomach. The external disc was cinched. Contrast injection confirms positioning within the stomach. Several spot radiographic images were obtained in various obliquities for documentation. The patient tolerated procedure well without immediate post procedural complication. FINDINGS: After successful  fluoroscopic guided placement, the gastrostomy tube is appropriately positioned with internal retention balloon against the ventral aspect of the gastric lumen. IMPRESSION: Successful fluoroscopic insertion of an 47 French balloon retention gastrostomy tube. The gastrostomy may be used immediately for medication administration and in 24 hrs for the initiation of feeds. Electronically Signed   By: Sandi Mariscal M.D.   On: 11/20/2021 14:17     Medications:     Scheduled Medications:   sodium chloride   Intravenous Once   ascorbic acid  250 mg Per Tube BID   aspirin  81 mg Per Tube Daily   atorvastatin  80 mg Per Tube Daily   Chlorhexidine Gluconate Cloth  6 each Topical Daily   darbepoetin (ARANESP) injection - DIALYSIS  100 mcg Intravenous Q Tue-HD   feeding supplement (PROSource TF20)  60 mL Per Tube TID   fentaNYL  1 patch Transdermal Q72H   fiber supplement (BANATROL TF)  60 mL Per Tube QID   folic acid  1 mg Per Tube Daily   Gerhardt's butt cream   Topical Daily   heparin injection (subcutaneous)  5,000 Units Subcutaneous Q8H   insulin aspart  0-20 Units Subcutaneous Q4H   leptospermum manuka honey  1 Application Topical Daily   midodrine  10 mg Oral 3 times per day on Tue Thu Sat   multivitamin  1 tablet Per Tube QHS   nutrition supplement (JUVEN)  1 packet Per Tube BID BM   mouth rinse  15 mL Mouth Rinse 4 times per day   pantoprazole  40 mg Per Tube BID   QUEtiapine  50 mg Per Tube QHS   sodium chloride flush  3 mL Intravenous Q12H   thiamine  100 mg Per Tube Daily    Infusions:  sodium chloride Stopped (11/09/21 0715)   sodium chloride Stopped (11/04/21 1021)   albumin human 12.5 g (11/01/21 2248)    ceFAZolin (ANCEF) IV      PRN Medications: sodium chloride, acetaminophen (TYLENOL) oral liquid 160 mg/5 mL, albumin human, camphor-menthol, clonazePAM, dextrose, diphenhydrAMINE, HYDROmorphone (DILAUDID) injection, iohexol, ipratropium-albuterol, lidocaine, loperamide HCl, ondansetron (ZOFRAN) IV, mouth rinse, [EXPIRED] oxyCODONE **FOLLOWED BY** oxyCODONE, polyvinyl alcohol, sodium chloride flush, white petrolatum   Assessment/Plan:    1. Shock - mixed cardiogenic/hemorrhagic -> VA ECMO -> decannulated on 8/28 to Impella 5.5 - Echo 08/28: Underfilled LV, EF 55-60% with severe LVH and near normal RV systsystolic function.  - Impella extracted 9/7 - Bedside echo 09/13 - LV function preserved, RV mildly reduced. Small clot in posterior pericardium but no  hemodynamic effect  - Completed total of 14 days of Micafungin (ended 9/27) - Off pressors.  - Had been on midodrine 20 TID >>> now weaning. Down to 10 TID on HD days only starting this am.   2. Cardiac arrest (PEA/bradycardic) - 8/19 and 8/28 in setting of tamponade - resolved  3. Cardiac tamponade with emergent bedside sternotomy  - Diffuse epicardial bleeding with post-op Dresslers - return to OR 8/21 and 8/24 for washouts.  - Washout 8/28 in OR. Multiple units of blood products in OR.   - Chest closed on 9/2 - Hgb stable 9.5 yesterday. CBC today  4. Acute hypoxemic respiratory failure - Off ECMO.  - Perc Trach placed 9/7  - s/p bronchoscopy w/ BAL 9/4.  - tracheal aspirate 09/13 growing few GPC and rare yeast  - Started back on IV abx as above d/t concern for sepsis.  - Trach downsized - Trach de cannulated  9/25  - Resolved. On room air.   5. AKI due to ATN - CRRT started 08/28.  - Remains anuric - Transitioned to [facility] 9/21. Nephrology following  - s/p [facility] cath by IR   - tolerating iHD (TTS schedule).   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 Now off colchicine   7. 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 Continue ASA 81 mg daily  8. DM2 - continue SSI   9. PAF/AFL - Tolerates poorly.  - Recurrent AFL. S/p DCCV to SR 08/29.  - Off amio d/t bradycardia - Not on AC for now d/t bleeding, DVT dose heparin.  - In NSR today  10. ID - Completed course of abx with open chest.  - Prior possible aspiration with vomiting treated with abx.  - No s/s of ongoing infection  11. FEN - TFs ongoing   Underwent gastrostomy tube placement 10/11 - SLP following   12. Hyperkalemia - K 5.5>6.8 - Received lokelma yesterday, has another dose ordered this am per Nephrology - Going for HD this am  13. Unstageable Pressure Ulcer, Buttock  - WOC following.  - Improving, still very painful  14. Fall - management per primary team  15. Diarrhea -  mgmt per primary - Imodium, some improvement   Outpatient HD chair approved.  PT/OT following. Will need SNF for rehab at discharge but has no insurance. HF TOC CM assisting with disposition.  Marlyce Huge, PA-C 7:01 AM  Patient seen and examined with the above-signed Advanced Practice Provider and/or Housestaff. I personally reviewed laboratory data, imaging studies and relevant notes. I independently examined the patient and formulated the important aspects of the plan. I have edited the note to reflect any of my changes or salient points. I have personally discussed the plan with the patient and/or family.  Tolerating HD. Midodrine being weaned. Remains in NSR. Getting feeds via G-tube. K 6.5  General:  Sitting up. No resp difficulty HEENT: normal Neck: supple. no JVD. Carotids 2+ bilat; no bruits. No lymphadenopathy or thryomegaly appreciated. Cor: PMI nondisplaced. Regular rate & rhythm. No rubs, gallops or murmurs. Lungs: clear Abdomen: soft, nontender, nondistended. No hepatosplenomegaly. No bruits or masses. Good bowel sounds. Extremities: no cyanosis, clubbing, rash, edema Neuro: alert & orientedx3, cranial nerves grossly intact. moves all 4 extremities w/o difficulty. Affect pleasant  Stable from cardiac status. Agree with midodrine wean. For HD today for hyperkalemia. Will chage TFs. Hopefully can be placed soon.   Glori Bickers, MD  8:44 PM

## 2021-11-21 NOTE — Progress Notes (Signed)
Received patient in bed to unit.  Alert and oriented.  Informed consent signed and in chart.   Treatment initiated: 0900 Treatment completed: 1330  Patient tolerated well.  Transported back to the room  Alert, without acute distress.  Hand-off given to patient's nurse.   Access used: Catheter Access issues: None  Total UF removed: 0.8L Medication(s) given: None Post HD VS: 98.2,93,15,132/82,98% Post HD weight: [facility]

## 2021-11-21 NOTE — Progress Notes (Signed)
Date and time results received: 11/21/21 0126   Test: Potassium Critical Value: 6.8  Per lab not hemolyzed   Name of Provider Notified: Primary RN to notify MD   Orders Received? Or Actions Taken?:

## 2021-11-21 NOTE — Progress Notes (Signed)
Brief Nutrition Note  Discussed pt with PA. G-tube has been cleared for use for tube feeds and meds by IR. PA okay with resuming tube feeds today. Will transition pt to bolus tube feeding regimen and will utilize low-potassium and low-phosphorus tube feeding formula (Nepro with Carb Steady) given persistently high potassium and phosphorus labs.  Discussed bolus tube feeding regimen with RN via phone call. Plan for first bolus to be half of goal volume to promote tolerance. Orders in place.  Begin to bolus tube feeding regimen: - 1 carton (237 ml) Nepro formula 5 x daily  First bolus: half carton (119 ml) Nepro formula  All other boluses: 1 carton (237 ml) Nepro formula - PROSource TF20 60 ml TID  Bolus tube feeding regimen provides 2340 kcal, 155 grams of protein, and 860 ml of H2O.  - Continue Juven BID per tube, each packet provides 80 calories, 8 grams of carbohydrate, 2.5 grams of protein (collagen), 7 grams of L-arginine and 7 grams of L-glutamine; supplement contains CaHMB, vitamins C, E, B12 and zinc to promote wound healing   - Continue vitamin C, zinc, folic acid, and renal MVI supplementation   - Continue Banatrol TF QID  RD will continue to follow pt during admission.   Gustavus Bryant, MS, RD, LDN Inpatient Clinical Dietitian Please see AMiON for contact information.

## 2021-11-22 ENCOUNTER — Inpatient Hospital Stay (HOSPITAL_COMMUNITY): Payer: Self-pay

## 2021-11-22 LAB — RENAL FUNCTION PANEL
Albumin: 2.9 g/dL — ABNORMAL LOW (ref 3.5–5.0)
Anion gap: 14 (ref 5–15)
BUN: 46 mg/dL — ABNORMAL HIGH (ref 8–23)
CO2: 28 mmol/L (ref 22–32)
Calcium: 9 mg/dL (ref 8.9–10.3)
Chloride: 94 mmol/L — ABNORMAL LOW (ref 98–111)
Creatinine, Ser: 4.41 mg/dL — ABNORMAL HIGH (ref 0.61–1.24)
GFR, Estimated: 14 mL/min — ABNORMAL LOW (ref >60)
Glucose, Bld: 84 mg/dL (ref 70–99)
Phosphorus: 4 mg/dL (ref 2.5–4.6)
Potassium: 4.5 mmol/L (ref 3.5–5.1)
Sodium: 136 mmol/L (ref 135–145)

## 2021-11-22 LAB — GLUCOSE, CAPILLARY
Glucose-Capillary: 106 mg/dL — ABNORMAL HIGH (ref 70–99)
Glucose-Capillary: 131 mg/dL — ABNORMAL HIGH (ref 70–99)
Glucose-Capillary: 70 mg/dL (ref 70–99)
Glucose-Capillary: 73 mg/dL (ref 70–99)
Glucose-Capillary: 93 mg/dL (ref 70–99)
Glucose-Capillary: 99 mg/dL (ref 70–99)

## 2021-11-22 LAB — CBC
HCT: 31.6 % — ABNORMAL LOW (ref 39.0–52.0)
Hemoglobin: 9.2 g/dL — ABNORMAL LOW (ref 13.0–17.0)
MCH: 27.7 pg (ref 26.0–34.0)
MCHC: 29.1 g/dL — ABNORMAL LOW (ref 30.0–36.0)
MCV: 95.2 fL (ref 80.0–100.0)
Platelets: 400 10*3/uL (ref 150–400)
RBC: 3.32 MIL/uL — ABNORMAL LOW (ref 4.22–5.81)
RDW: 17.2 % — ABNORMAL HIGH (ref 11.5–15.5)
WBC: 8.7 10*3/uL (ref 4.0–10.5)
nRBC: 0 % (ref 0.0–0.2)

## 2021-11-22 LAB — MAGNESIUM: Magnesium: 1.8 mg/dL (ref 1.7–2.4)

## 2021-11-22 MED ORDER — HEPARIN SODIUM (PORCINE) 1000 UNIT/ML DIALYSIS
20.0000 [IU]/kg | INTRAMUSCULAR | Status: DC | PRN
  Filled 2021-11-22: qty 2

## 2021-11-22 MED ORDER — OSMOLITE 1.5 CAL PO LIQD: 356.0000 mL | Freq: Four times a day (QID) | ORAL | Status: DC

## 2021-11-22 NOTE — TOC Progression Note (Signed)
Transition of Care [facility]) - Progression Note    Patient Details  Name: [name] MRN: [id number] Date of Birth: [birth year]  Transition of Care [facility]) CM/SW [place], [place] Phone Number: 11/22/2021, 11:27 AM  Clinical Narrative:     CSW met with pt and updated on SNF search process. CSW explained potential barriers (pending medicaid, OPHD). Pt is approved for LOG. TOC will continue to seek SNF placement that will accept LOG/Medicaid pending.   Expected Discharge Plan: Skilled Nursing Facility Barriers to Discharge: Continued Medical Work up, No SNF bed, SNF Pending Medicaid  Expected Discharge Plan and Services Expected Discharge Plan: [place]                                               Social Determinants of Health (SDOH) Interventions    Readmission Risk Interventions    09/20/2021   11:57 AM  Readmission Risk Prevention Plan  Post Dischage Appt Complete  Medication Screening Complete  Transportation Screening Complete

## 2021-11-22 NOTE — Progress Notes (Signed)
Speech Language Pathology Treatment: Dysphagia  Patient Details Name: [name] MRN: [id number] DOB: 1960-12-12 Today's Date: 11/22/2021 Time: 4132-4401 SLP Time Calculation (min) (ACUTE ONLY): 19 min  Assessment / Plan / Recommendation Clinical Impression  Pt is alert, upright in his chair, and cognitively improved from the last time he was seen by this SLP. Ice chips offered, which pt consumed without overt signs of dysphagia or aspiration. He expressed frustration over not being able to have anything other than small amounts of ice chips. Education was provided about results of previous FEES x2, potential impact of acute hospital events on swallowing function, as well as recommendation to proceed with MBS to better evaluate oropharyngeal function. He is in agreement with MBS today and believes he will be fine to sit up in the chair (study tentatively planned for 10:30 per radiology's availability and pt's preference). Hopeful for improvements in overall function given overall clinical improvements since last FEES, but will continue to recommend NPO (other than a few ice chips at a time after oral care) pending completion of MBS.    HPI [name], bradycardia and hypotension. Pt with bil pleural effusion and pericardial effusion s/p VATS with pericardial window 8/19. 8/20 cardiac tamponade with arrest,  intubated with bedside mediastinal exploration and ECMO via fem access. OR 8/21 for reexploration due to mediastinal hemorrhage. 8/28 OR for washout with ECMO decannulation and Impella placed. 8/29 brief PEA arrest with DCCV to NSR. 9/2 chest closure. 9/7 trach and Impella removed.9/11 VAC removed. Decannulated 9/26. PMHx: CAD s/p recent CABG x5 (admitted  8/3-11/23 with post op Afib), CKD stage II, DM, HTN, AKI, Afib and CVA      SLP Plan  MBS      Recommendations for follow up therapy are one component of a multi-disciplinary discharge planning process, led by  the attending physician.  Recommendations may be updated based on patient status, additional functional criteria and insurance authorization.    Recommendations  Diet recommendations: NPO;Other(comment) (ice chips after oral care) Medication Administration: Via alternative means                Oral Care Recommendations: Oral care QID Follow Up Recommendations: Skilled nursing-short term rehab (<3 hours/day) Assistance recommended at discharge: Frequent or constant Supervision/Assistance SLP Visit Diagnosis: Dysphagia, pharyngeal phase (R13.13) Plan: MBS           Osie Bond., M.A. [place] Office [phone]  Secure chat preferred   11/22/2021, 9:05 AM

## 2021-11-22 NOTE — Progress Notes (Signed)
Modified Barium Swallow Progress Note  Patient Details  Name: [name] MRN: [id number] Date of Birth: [birth year]  Today's Date: 11/22/2021  Modified Barium Swallow completed.  Full report located under Chart Review in the Imaging Section.  Brief recommendations include the following:  Clinical Impression  Pt shows great improvements on MBS today compared to oropharyngeal function on previous FEES. He has an oral dysphagia that includes piecemeal swallowing and bilateral buccal pocketing with thin liquids, purees, and solids. He responded well to verbal cues to swallow again, clearing more of his oral residue but not completely clearing his oral cavity. Occasional premature spillage was observed with thin liquids, and he needs somewhat prolonged mastication time with a graham cracker. Pharyngeally there is still some weakness with reduced hyoid excursion and base of tongue retraction, but his pharyngeal residue is significantly reduced. There is a trace coating along his base of tongue and into his valleculae, but no significant build up of pharyngeal residue regardless of texture. Recommend that pt initiate Dys 2 diet and thin liquids. As pt can demonstrate improving oral cavity clearance, he will likely be able to advance his diet clinically at this point. Would try to optimize positioning during PO intake, which may be a little more challenging for him given wounds, but he has demonstrated his ability to sit upright in the chair and the bed.   Swallow Evaluation Recommendations       SLP Diet Recommendations: Dysphagia 2 (Fine chop) solids;Thin liquid   Liquid Administration via: Cup;Straw   Medication Administration: Whole meds with liquid   Supervision: Staff to assist with self feeding;Full supervision/cueing for compensatory strategies   Compensations: Minimize environmental distractions;Slow rate;Small sips/bites;Lingual sweep for clearance of pocketing   Postural Changes:  Seated upright at 90 degrees   Oral Care Recommendations: Oral care BID   Other Recommendations: Have oral suction available    Osie Bond., M.A. [place] Office [phone]  Secure chat preferred  11/22/2021,11:47 AM

## 2021-11-22 NOTE — Plan of Care (Signed)
  Problem: Activity: Goal: Risk for activity intolerance will decrease Outcome: Progressing   Problem: Respiratory: Goal: Respiratory status will improve Outcome: Progressing   Problem: Cardiac: Goal: Ability to achieve and maintain adequate cardiovascular perfusion will improve Outcome: Progressing   Problem: Clinical Measurements: Goal: Diagnostic test results will improve Outcome: Progressing Goal: Respiratory complications will improve Outcome: Progressing Goal: Cardiovascular complication will be avoided Outcome: Progressing   Problem: Activity: Goal: Risk for activity intolerance will decrease Outcome: Progressing   Problem: Coping: Goal: Level of anxiety will decrease Outcome: Progressing   Problem: Pain Managment: Goal: General experience of comfort will improve Outcome: Progressing   Problem: Safety: Goal: Ability to remain free from injury will improve Outcome: Progressing

## 2021-11-22 NOTE — Progress Notes (Addendum)
      [address]       [place] [address]             [phone]       2 Days Post-Op Procedure(s) (LRB): G-Tube Placement (N/A) Subjective: Patient states his bottom hurts and is having loose stools since bolus tube feeds were started.  Objective: Vital signs in last 24 hours: Temp:  [98.2 F (36.8 C)-98.9 F (37.2 C)] 98.7 F (37.1 C) (10/13 0401) Pulse Rate:  [81-98] 94 (10/13 0401) Cardiac Rhythm: Sinus tachycardia (10/13 0712) Resp:  [12-23] 20 (10/13 0401) BP: (108-142)/(57-85) 111/75 (10/13 0401) SpO2:  [94 %-98 %] 94 % (10/13 0401) Weight:  [87 kg-87.5 kg] 87.5 kg (10/13 0131)  Hemodynamic parameters for last 24 hours:    Intake/Output from previous day: 10/12 0701 - 10/13 0700 In: -  Out: 0.8  Intake/Output this shift: No intake/output data recorded.  General appearance: alert, cooperative, and no distress Neurologic: intact Heart: regular rate and rhythm, S1, S2 normal, no murmur, click, rub or gallop Lungs: clear to auscultation bilaterally Abdomen: soft, non-tender; bowel sounds normal; no masses,  no organomegaly Extremities: extremities normal, atraumatic, no cyanosis or edema Wound: Chest and sacral dressings newly changed, clean and dry  Lab Results: Recent Labs    11/21/21 0940 11/22/21 0046  WBC 7.4 8.7  HGB 9.5* 9.2*  HCT 32.5* 31.6*  PLT 442* 400   BMET:  Recent Labs    11/21/21 0940 11/22/21 0046  NA 134* 136  K 6.5* 4.5  CL 93* 94*  CO2 21* 28  GLUCOSE 90 84  BUN 119* 46*  CREATININE 7.87* 4.41*  CALCIUM 9.7 9.0    PT/INR:  Recent Labs    11/20/21 0009  LABPROT 14.4  INR 1.1   ABG    Component Value Date/Time   PHART 7.301 (L) 10/22/2021 2354   HCO3 19.3 (L) 10/22/2021 2354   TCO2 20 (L) 10/22/2021 2354   ACIDBASEDEF 7.0 (H) 10/22/2021 2354   O2SAT 71.7 11/02/2021 0439   CBG (last 3)  Recent Labs    11/21/21 2008 11/21/21 2322 11/22/21 0358  GLUCAP 114* 91 73     Assessment/Plan: S/P Procedure(s) (LRB): G-Tube Placement (N/A)  CV: NSR, rate 94 this AM. SBP 111. On midodrine 10mg  TID on HD days. Will continue to wean as BP tolerates.  Pulm: Saturating 94% on RA. Continue IS and ambulation with PT. GI: Loose stools since bolus G-tube feeds started. Hopefully can pass MBS today and can start PO diet. Endo: T2DM GCS 114/91/73 Renal: ESRD requiring dialysis. HD yesterday. Hyperkalemia improved 4.5 today. Wounds: Open sternal wound and sacral wound. Continue dressing changes. No sign of infection. Deconditioning: Continue PT/OT Dispo: Approved for SNF, awaiting insurance. Would like to have him on PO diet before d/c.   LOS: 67 days    Mark River, PA-C 11/22/2021   Chart reviewed, patient examined, agree with above. He is doing well overall. Still have loose stools with bolus tube feeds. ST planning MBS to evaluate swallowing today. Hopefully he will pass and can start on oral diet. I think that would speed up his recovery. The loose stools are his only complaint at this time and make every day miserable.

## 2021-11-22 NOTE — Progress Notes (Signed)
Physical Therapy Treatment Patient Details Name: [name] MRN: [id number] DOB: [birth year] Today's Date: 11/22/2021   History of Present Illness Pt is a 61 y.o. male admitted 09/28/21 with SOB, bradycardia, hypotension. Workup for bilateral pleural effusion, pericardial effusion s/p VATS with pericardial window 8/19. Cardiac arrest with tamponade 8/20, intubated with bedside mediastinal exploration, ECMO via fem access. Pt with mediastinal hemorrhage s/p reexploration 8/21. To OR 8/28 for washout, ECMO decannulation, Impella placement. CRRT initiated 8/28. Brief PEA arrest 8/29 with DCCV to NSR.  S/p chest closure 9/2. S/p trach and impella removed 9/7. S/p wound vac removal 9/11. Course complicated by bouts of hypotension and bradycardia. Off CRRT 9/19. Trach decannulated 9/25. Tunned HD cath placement 9/25; iHD initiated. PMH includes CAD (s/p recent CABG 09/12/21), CKD2, DM, HTN, AKI, afib, CVA.    PT Comments    Pt with excellent progress this session with focus on gait training for increased activity tolerance and LE strength. Pt able to demonstrate gait with RW and min guard assist and chair follow for >200' with seated rest breaks as needed secondary to fatigue. Pt needing mod assist to power up to standing at beginning of session down to light min assist with practice. Pt continues to be limited by decreased activity tolerance, weakness and impaired balance. Pt continues to benefit from skilled PT services to progress toward functional mobility goals.   HR max 132bpm during activity.    Recommendations for follow up therapy are one component of a multi-disciplinary discharge planning process, led by the attending physician.  Recommendations may be updated based on patient status, additional functional criteria and insurance authorization.  Follow Up Recommendations  Skilled nursing-short term rehab (<3 hours/day) Can patient physically be transported by private vehicle: No   Assistance  Recommended at Discharge Frequent or constant Supervision/Assistance  Patient can return home with the following A lot of help with bathing/dressing/bathroom;A lot of help with walking and/or transfers;Direct supervision/assist for financial management;Help with stairs or ramp for entrance;Direct supervision/assist for medications management;Assistance with cooking/housework;Assist for transportation   Equipment Recommendations  Wheelchair (measurements PT);Wheelchair cushion (measurements PT);Hospital bed    Recommendations for Other Services       Precautions / Restrictions Precautions Precautions: Sternal;Fall;Other (comment) Precaution Booklet Issued: No Precaution Comments: painful sacral/buttocks wound, sternal wound Restrictions Weight Bearing Restrictions: No     Mobility  Bed Mobility Overal bed mobility: Needs Assistance   Rolling: Min guard Sidelying to sit: Min assist       General bed mobility comments: min asssit to elevate trunk from sidelying    Transfers Overall transfer level: Needs assistance Equipment used: Rolling walker (2 wheels) Transfers: Sit to/from Stand, Bed to chair/wheelchair/BSC Sit to Stand: Mod assist, Min assist   Step pivot transfers: Min assist       General transfer comment: patient required mod assist to stand from EOB to RW decreaseing to min assist with practice, STS x 7 throughout session    Ambulation/Gait Ambulation/Gait assistance: Min guard Gait Distance (Feet): 260 Feet (x 6 seated rest breaks throughout) Assistive device: Rolling walker (2 wheels) Gait Pattern/deviations: Step-through pattern, Decreased stride length, Narrow base of support       General Gait Details: decreased BOS with short strides, reliance on UE support, pt requesting no hands on guarding as it was" throwing off his balance" close chair follow for safety   Stairs             Wheelchair Mobility    Modified Rankin (Stroke Patients  Only)       Balance Overall balance assessment: Needs assistance Sitting-balance support: Feet supported Sitting balance-Leahy Scale: Fair Sitting balance - Comments: seated on EOB upon entry   Standing balance support: Bilateral upper extremity supported Standing balance-Leahy Scale: Poor Standing balance comment: reliant on RW for support                            Cognition Arousal/Alertness: Awake/alert Behavior During Therapy: Flat affect Overall Cognitive Status: Impaired/Different from baseline Area of Impairment: Attention, Memory, Following commands, Safety/judgement, Awareness, Problem solving                   Current Attention Level: Selective Memory: Decreased short-term memory Following Commands: Follows one step commands consistently Safety/Judgement: Decreased awareness of safety, Decreased awareness of deficits Awareness: Emergent Problem Solving: Requires verbal cues, Slow processing General Comments: able to follow commands and express needs        Exercises      General Comments        Pertinent Vitals/Pain Pain Assessment Pain Assessment: Faces Faces Pain Scale: Hurts even more Pain Location: perineal wounds Pain Descriptors / Indicators: Sore Pain Intervention(s): Monitored during session, Limited activity within patient's tolerance    Home Living                          Prior Function            PT Goals (current goals can now be found in the care plan section) Acute Rehab PT Goals PT Goal Formulation: With patient Time For Goal Achievement: 11/29/21    Frequency    Min 3X/week      PT Plan Current plan remains appropriate    Co-evaluation              AM-PAC PT "6 Clicks" Mobility   Outcome Measure  Help needed turning from your back to your side while in a flat bed without using bedrails?: A Lot Help needed moving from lying on your back to sitting on the side of a flat bed without  using bedrails?: A Lot Help needed moving to and from a bed to a chair (including a wheelchair)?: A Lot Help needed standing up from a chair using your arms (e.g., wheelchair or bedside chair)?: A Lot Help needed to walk in hospital room?: A Lot Help needed climbing 3-5 steps with a railing? : Total 6 Click Score: 11    End of Session   Activity Tolerance: Patient tolerated treatment well Patient left: with call bell/phone within reach;Other (comment) (on bsc) Nurse Communication: Mobility status PT Visit Diagnosis: Other abnormalities of gait and mobility (R26.89);Difficulty in walking, not elsewhere classified (R26.2);Muscle weakness (generalized) (M62.81)     Time: 1779-3903 PT Time Calculation (min) (ACUTE ONLY): 21 min  Charges:  $Gait Training: 8-22 mins                     Alonna Bartling R. PTA Acute Rehabilitation Services Office: [place] 11/22/2021, 1:10 PM

## 2021-11-22 NOTE — Progress Notes (Addendum)
Patient ID: DEMAR SHAD, male   DOB: May 22, 1960, 61 y.o.   MRN: [id number]   Advanced Heart Failure Rounding Note   Subjective:    - 8/19 Pericardial window - 8/20 Cardiac arrest with tamponade -> Emergent bedside washout - 09/29/21 VA Cannulation - 09/30/21 Return to OR for mediastinal hemorrhage - 10/01/21 Developed AF -> amio - 10/02/21 TEE EF 25-30%  - 10/04/21 OR for washout. C/b continued bleeding overnight - 10/07/21 Placement of Impella 5.5 with washout, VA ECMO decannulation. Hypotensive with development of severe RV dysfunction after ECMO off and pressors titrated up. Multiple units of blood products. - 10/08/21 Brief PEA arrest. AFL with RVR >> S/p DCCV to SR, back in AFL shortly after - 8/31 Give 1UPRBCs  - 9/2 OR for chest closure - 9/3 Hypotensive overnight w/ SBPs in 80s. Febrile, mTemp 100.8. CRRT paused. VP increased to 0.04.  - 9/4 s/p bronchoscopy w/ BAL by PCCM, Cx NGTD  - 9/5 vomiting w/ large volume NGT output + watery/foul diarrhea, Tube feeds held. No signs of ileus on KUB. C-diff negative   - 9/7 OR for Impella Extraction and percutaneous tracheostomy. 2 u RBCs. - 9/8 CVVH stopped and line removed for holiday - 9/9 CVVH restarted.  -9/13 Worsening leukocytosis and pressor requirements. Purulent drainage from arterial line. Started Daptomycin, Micafungin and Meropenem. - 9/17 Daptomycin + Micafungin stopped. Remains on Meropenum  - 9/21 transitioned to iHD  - 9/25 Tunneled HD cath placed. Trach decannulation - 9/26 Hgb 6.1>>transfused 2uRBC  - 10/11 gtube placed  Underwent G-tube placement 10/11. Still unable to tolerate TFs, even with bolus feeds he has to immediately have a BM.  K improved after HD, 4.5 today  SBP 110s-120s. Weaning midodrine for only HD days.   Some sacral pain and itchiness. Denies CP and SOB.   Objective:     Vital Signs:   Temp:  [98.2 F (36.8 C)-98.9 F (37.2 C)] 98.7 F (37.1 C) (10/13 0401) Pulse Rate:  [81-98] 94 (10/13  0401) Resp:  [12-23] 20 (10/13 0401) BP: (108-142)/(57-85) 111/75 (10/13 0401) SpO2:  [94 %-98 %] 94 % (10/13 0401) Weight:  [87 kg-87.5 kg] 87.5 kg (10/13 0131) Last BM Date : 11/21/21  Weight change: Filed Weights   11/21/21 0505 11/21/21 1353 11/22/21 0131  Weight: 88 kg 87 kg 87.5 kg    Intake/Output:   Intake/Output Summary (Last 24 hours) at 11/22/2021 0742 Last data filed at 11/21/2021 1343 Gross per 24 hour  Intake --  Output 0.8 ml  Net -0.8 ml   General:  chronically ill appearing.  No respiratory difficulty HEENT: normal Neck: supple. JVD ~7 cm. Carotids 2+ bilat; no bruits. No lymphadenopathy or thyromegaly appreciated. Cor: PMI nondisplaced. Regular rate & rhythm. No rubs, gallops or murmurs. Lungs: clear, diminished bases Abdomen: soft, nontender, nondistended. No hepatosplenomegaly. No bruits or masses. Good bowel sounds. Gtube C/D/I Extremities: no cyanosis, clubbing, rash, edema  Neuro: alert & oriented x 3, cranial nerves grossly intact. moves all 4 extremities w/o difficulty. Affect pleasant.   Telemetry: SR 90s  Labs: Basic Metabolic Panel: Recent Labs  Lab 11/18/21 0011 11/19/21 0646 11/20/21 0009 11/21/21 0013 11/21/21 0940 11/22/21 0046  NA 135 135 134* 133* 134* 136  K 4.8 5.0 5.5* 6.8* 6.5* 4.5  CL 94* 96* 95* 92* 93* 94*  CO2 _0 21* 28  GLUCOSE 83 114* 84 118* 90 84  BUN 76* 99* 84* 111* 119* 46*  CREATININE 5.03* 6.65* 5.85* 7.29*  7.87* 4.41*  CALCIUM 9.1 9.2 9.2 9.7 9.7 9.0  MG 1.8 2.0 1.8 2.1  --  1.8  PHOS 3.4 4.1 3.9 5.6* 6.2* 4.0    Liver Function Tests: Recent Labs  Lab 11/19/21 0646 11/20/21 0009 11/21/21 0013 11/21/21 0940 11/22/21 0046  ALBUMIN 2.6* 2.8* 2.9* 3.0* 2.9*   No results for input(s): "LIPASE", "AMYLASE" in the last 168 hours. No results for input(s): "AMMONIA" in the last 168 hours.  CBC: Recent Labs  Lab 11/16/21 0400 11/19/21 0646 11/20/21 0009 11/21/21 0940 11/22/21 0046  WBC 8.5  9.0 8.2 7.4 8.7  HGB 8.8* 9.0* 9.5* 9.5* 9.2*  HCT 30.1* 29.4* 31.5* 32.5* 31.6*  MCV 95.6 92.5 93.2 95.9 95.2  PLT 452* 434* 416* 442* 400    Cardiac Enzymes: No results for input(s): "CKTOTAL", "CKMB", "CKMBINDEX", "TROPONINI" in the last 168 hours.   BNP: BNP (last 3 results) Recent Labs    12/24/20 0651 09/28/21 0405  BNP 412.0* 826.6*    ProBNP (last 3 results) No results for input(s): "PROBNP" in the last 8760 hours.    Other results:  Imaging: IR GASTROSTOMY TUBE MOD SED  Result Date: 11/20/2021 INDICATION: Recent CABG, now with dysphagia. Please perform percutaneous gastrostomy tube placement for enteric nutrition supplementation purposes. Patient underwent attempted percutaneous gastrostomy tube placement on 11/15/2021 however was unable to tolerate the procedure and as such returns today for the procedure was performed with general anesthesia. EXAM: PUSH GASTROSTOMY TUBE PLACEMENT COMPARISON:  Attempted image guided percutaneous gastrostomy tube placement-11/15/2021; chest CT-09/28/2021 MEDICATIONS: Ancef 2 gm IV; Antibiotics were administered within 1 hour of the procedure. CONTRAST:  20 cc Omnipaque 300 administered into the gastric lumen. ANESTHESIA/SEDATION: General anesthesia FLUOROSCOPY TIME:  1 minute, 18 seconds (35 mGy) COMPLICATIONS: None immediate. PROCEDURE: Informed written consent was obtained from the family following explanation of the procedure, risks, benefits and alternatives. A time out was performed prior to the initiation of the procedure. Ultrasound scanning was performed to demarcate the edge of the left lobe of the liver. Attention was made to avoid the healing incisions from previous mediastinal drains as a sequela of recent CABG. Maximal barrier sterile technique utilized including caps, mask, sterile gowns, sterile gloves, large sterile drape, hand hygiene and Betadine prep. The left upper quadrant was sterilely prepped and draped. A oral gastric  catheter was inserted into the stomach under fluoroscopy. The existing nasogastric feeding tube was removed. The left costal margin and barium opacified transverse colon were identified and avoided. Air was injected into the stomach for insufflation and visualization under fluoroscopy. Under sterile conditions and local anesthesia, 3 T tacks were utilized to pexy the anterior aspect of the stomach against the ventral abdominal wall. Contrast injection confirmed appropriate positioning of each of the T tacks. An incision was made between the T tacks and a 17 gauge trocar needle was utilized to access the stomach. Needle position was confirmed within the stomach with aspiration of air and injection of a small amount of contrast. A stiff Glidewire was advanced into the gastric lumen and under intermittent fluoroscopic guidance, the access needle was exchanged for a Kumpe catheter. With the use of the Kumpe catheter, a stiff Glidewire was advanced into the horizontal segment of the duodenum. Under intermittent fluoroscopic guidance, the Kumpe catheter was exchanged for a telescoping peel-away sheath, ultimately allowing placement of a 18-French balloon retention gastrostomy tube. The retention balloon was insufflated with a mixture of dilute saline and contrast and pulled taut against the anterior wall of  the stomach. The external disc was cinched. Contrast injection confirms positioning within the stomach. Several spot radiographic images were obtained in various obliquities for documentation. The patient tolerated procedure well without immediate post procedural complication. FINDINGS: After successful fluoroscopic guided placement, the gastrostomy tube is appropriately positioned with internal retention balloon against the ventral aspect of the gastric lumen. IMPRESSION: Successful fluoroscopic insertion of an 62 French balloon retention gastrostomy tube. The gastrostomy may be used immediately for medication  administration and in 24 hrs for the initiation of feeds. Electronically Signed   By: Sandi Mariscal M.D.   On: 11/20/2021 14:17     Medications:     Scheduled Medications:  sodium chloride   Intravenous Once   ascorbic acid  250 mg Per Tube BID   aspirin  81 mg Per Tube Daily   atorvastatin  80 mg Per Tube Daily   Chlorhexidine Gluconate Cloth  6 each Topical Daily   darbepoetin (ARANESP) injection - DIALYSIS  100 mcg Intravenous Q Tue-HD   feeding supplement (NEPRO CARB STEADY)  237 mL Per Tube 5 X Daily   feeding supplement (PROSource TF20)  60 mL Per Tube TID   fentaNYL  1 patch Transdermal Q72H   fiber supplement (BANATROL TF)  60 mL Per Tube QID   folic acid  1 mg Per Tube Daily   Gerhardt's butt cream   Topical Daily   heparin injection (subcutaneous)  5,000 Units Subcutaneous Q8H   insulin aspart  0-20 Units Subcutaneous Q4H   leptospermum manuka honey  1 Application Topical Daily   midodrine  10 mg Oral 3 times per day on Tue Thu Sat   multivitamin  1 tablet Per Tube QHS   nutrition supplement (JUVEN)  1 packet Per Tube BID BM   mouth rinse  15 mL Mouth Rinse 4 times per day   pantoprazole  40 mg Per Tube BID   QUEtiapine  50 mg Per Tube QHS   sodium chloride flush  3 mL Intravenous Q12H   thiamine  100 mg Per Tube Daily    Infusions:  sodium chloride Stopped (11/09/21 0715)   sodium chloride Stopped (11/04/21 1021)   albumin human 12.5 g (11/01/21 2248)    PRN Medications: sodium chloride, acetaminophen (TYLENOL) oral liquid 160 mg/5 mL, albumin human, camphor-menthol, clonazePAM, dextrose, diphenhydrAMINE, HYDROmorphone (DILAUDID) injection, iohexol, ipratropium-albuterol, lidocaine, loperamide HCl, ondansetron (ZOFRAN) IV, mouth rinse, [EXPIRED] oxyCODONE **FOLLOWED BY** oxyCODONE, polyvinyl alcohol, sodium chloride flush, white petrolatum   Assessment/Plan:    1. Shock - mixed cardiogenic/hemorrhagic -> VA ECMO -> decannulated on 8/28 to Impella 5.5 - Echo  08/28: Underfilled LV, EF 55-60% with severe LVH and near normal RV systsystolic function.  - Impella extracted 9/7 - Bedside echo 09/13 - LV function preserved, RV mildly reduced. Small clot in posterior pericardium but no hemodynamic effect  - Completed total of 14 days of Micafungin (ended 9/27) - Off pressors.  - Had been on midodrine 20 TID >>> now weaning. Down to 10 TID on HD days only starting this am. BP stable this am  2. Cardiac arrest (PEA/bradycardic) - 8/19 and 8/28 in setting of tamponade - resolved  3. Cardiac tamponade with emergent bedside sternotomy  - Diffuse epicardial bleeding with post-op Dresslers - return to OR 8/21 and 8/24 for washouts.  - Washout 8/28 in OR. Multiple units of blood products in OR.   - Chest closed on 9/2 - Hgb stable 9.2 yesterday. CBC today  4. Acute hypoxemic respiratory failure -  Off ECMO.  - Perc Trach placed 9/7  - s/p bronchoscopy w/ BAL 9/4.  - tracheal aspirate 09/13 growing few GPC and rare yeast  - Started back on IV abx as above d/t concern for sepsis.  - Trach downsized - Trach decannulated 9/25  - Resolved. On room air.   5. AKI due to ATN - CRRT started 08/28.  - Remains anuric - Transitioned to [facility] 9/21. Nephrology following  - s/p [facility] cath by IR   - tolerating iHD (TTS schedule).   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 Now off colchicine   7. 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 Continue ASA 81 mg daily  8. DM2 - continue SSI   9. PAF/AFL - Tolerates poorly.  - Recurrent AFL. S/p DCCV to SR 08/29.  - Off amio d/t bradycardia - Not on AC for now d/t bleeding, DVT dose heparin.  - In NSR today  10. ID - Completed course of abx with open chest.  - Prior possible aspiration with vomiting treated with abx.  - No s/s of ongoing infection  11. FEN - TFs ongoing, issues with BMs. Trying bolus feeds now - Underwent gastrostomy tube placement 10/11 - SLP following   12.  Hyperkalemia - K to 6.5 yesterday, improved after HD 4.5 today - has received Lokelma in the past for ^^K  13. Unstageable Pressure Ulcer, Buttock  - WOC following.  - Improving, still very painful  14. Fall - management per primary team  15. Diarrhea - mgmt per primary - Imodium, some improvement  Outpatient HD chair approved.  PT/OT following. Will need SNF for rehab at discharge but has no insurance. HF TOC CM assisting with disposition.  Earnie Larsson, AGACNP-BC  7:42 AM  Patient seen and examined with the above-signed Advanced Practice Provider and/or Housestaff. I personally reviewed laboratory data, imaging studies and relevant notes. I independently examined the patient and formulated the important aspects of the plan. I have edited the note to reflect any of my changes or salient points. I have personally discussed the plan with the patient and/or family.  Remains in NSR. Tolerating HD. Weaning midodrine.   Hyperkalemia improved.   General:  Weak appearing. No resp difficulty HEENT: normal Neck: supple. no JVD. Carotids 2+ bilat; no bruits. No lymphadenopathy or thryomegaly appreciated. Cor: PMI nondisplaced. Regular rate & rhythm. No rubs, gallops or murmurs. Lungs: clear Abdomen: soft, nontender, nondistended. No hepatosplenomegaly. No bruits or masses. Good bowel sounds. + G-tube Extremities: no cyanosis, clubbing, rash, edema Neuro: alert & orientedx3, cranial nerves grossly intact. moves all 4 extremities w/o difficulty. Affect pleasant  Continues to make progress. Should be ready for SNF placement soon. We will see again Monday unless called.   Glori Bickers, MD  3:55 PM

## 2021-11-22 NOTE — Progress Notes (Signed)
Patient ID: [name], male   DOB: 1960-07-27, 61 y.o.   MRN: [id number] S: no new complaints. O:BP 111/75 (BP Location: Right Arm)   Pulse 94   Temp 98.7 F (37.1 C) (Oral)   Resp 20   Ht 5\' 7"  (1.702 m)   Wt 87.5 kg   SpO2 94%   BMI 30.21 kg/m   Intake/Output Summary (Last 24 hours) at 11/22/2021 1017 Last data filed at 11/21/2021 1343 Gross per 24 hour  Intake --  Output 0.8 ml  Net -0.8 ml   Intake/Output: I/O last 3 completed shifts: In: -  Out: 0.8 [Other:0.8]  Intake/Output this shift:  No intake/output data recorded. Weight change: -1 kg Gen:NAD CVS: RRR Resp:CTA Abd: +BS, PEG in place Ext: trace pretibial edema  Recent Labs  Lab 11/17/21 0936 11/18/21 0011 11/19/21 0646 11/20/21 0009 11/21/21 0013 11/21/21 0940 11/22/21 0046  NA 135 135 135 134* 133* 134* 136  K 4.6 4.8 5.0 5.5* 6.8* 6.5* 4.5  CL 96* 94* 96* 95* 92* 93* 94*  CO2 26 26 24 23 23  21* 28  GLUCOSE 128* 83 114* 84 118* 90 84  BUN 51* 76* 99* 84* 111* 119* 46*  CREATININE 4.22* 5.03* 6.65* 5.85* 7.29* 7.87* 4.41*  ALBUMIN 2.6* 2.6* 2.6* 2.8* 2.9* 3.0* 2.9*  CALCIUM 9.0 9.1 9.2 9.2 9.7 9.7 9.0  PHOS 3.8 3.4 4.1 3.9 5.6* 6.2* 4.0   Liver Function Tests: Recent Labs  Lab 11/21/21 0013 11/21/21 0940 11/22/21 0046  ALBUMIN 2.9* 3.0* 2.9*   No results for input(s): "LIPASE", "AMYLASE" in the last 168 hours. No results for input(s): "AMMONIA" in the last 168 hours. CBC: Recent Labs  Lab 11/16/21 0400 11/19/21 0646 11/20/21 0009 11/21/21 0940 11/22/21 0046  WBC 8.5 9.0 8.2 7.4 8.7  HGB 8.8* 9.0* 9.5* 9.5* 9.2*  HCT 30.1* 29.4* 31.5* 32.5* 31.6*  MCV 95.6 92.5 93.2 95.9 95.2  PLT 452* 434* 416* 442* 400   Cardiac Enzymes: No results for input(s): "CKTOTAL", "CKMB", "CKMBINDEX", "TROPONINI" in the last 168 hours. CBG: Recent Labs  Lab 11/21/21 1611 11/21/21 2008 11/21/21 2322 11/22/21 0358 11/22/21 0930  GLUCAP 89 114* 91 73 99    Iron Studies: No results for  input(s): "IRON", "TIBC", "TRANSFERRIN", "FERRITIN" in the last 72 hours. Studies/Results: IR GASTROSTOMY TUBE MOD SED  Result Date: 11/20/2021 INDICATION: Recent CABG, now with dysphagia. Please perform percutaneous gastrostomy tube placement for enteric nutrition supplementation purposes. Patient underwent attempted percutaneous gastrostomy tube placement on 11/15/2021 however was unable to tolerate the procedure and as such returns today for the procedure was performed with general anesthesia. EXAM: PUSH GASTROSTOMY TUBE PLACEMENT COMPARISON:  Attempted image guided percutaneous gastrostomy tube placement-11/15/2021; chest CT-09/28/2021 MEDICATIONS: Ancef 2 gm IV; Antibiotics were administered within 1 hour of the procedure. CONTRAST:  20 cc Omnipaque 300 administered into the gastric lumen. ANESTHESIA/SEDATION: General anesthesia FLUOROSCOPY TIME:  1 minute, 18 seconds (35 mGy) COMPLICATIONS: None immediate. PROCEDURE: Informed written consent was obtained from the family following explanation of the procedure, risks, benefits and alternatives. A time out was performed prior to the initiation of the procedure. Ultrasound scanning was performed to demarcate the edge of the left lobe of the liver. Attention was made to avoid the healing incisions from previous mediastinal drains as a sequela of recent CABG. Maximal barrier sterile technique utilized including caps, mask, sterile gowns, sterile gloves, large sterile drape, hand hygiene and Betadine prep. The left upper quadrant was sterilely prepped and draped. A oral  gastric catheter was inserted into the stomach under fluoroscopy. The existing nasogastric feeding tube was removed. The left costal margin and barium opacified transverse colon were identified and avoided. Air was injected into the stomach for insufflation and visualization under fluoroscopy. Under sterile conditions and local anesthesia, 3 T tacks were utilized to pexy the anterior aspect of  the stomach against the ventral abdominal wall. Contrast injection confirmed appropriate positioning of each of the T tacks. An incision was made between the T tacks and a 17 gauge trocar needle was utilized to access the stomach. Needle position was confirmed within the stomach with aspiration of air and injection of a small amount of contrast. A stiff Glidewire was advanced into the gastric lumen and under intermittent fluoroscopic guidance, the access needle was exchanged for a Kumpe catheter. With the use of the Kumpe catheter, a stiff Glidewire was advanced into the horizontal segment of the duodenum. Under intermittent fluoroscopic guidance, the Kumpe catheter was exchanged for a telescoping peel-away sheath, ultimately allowing placement of a 18-French balloon retention gastrostomy tube. The retention balloon was insufflated with a mixture of dilute saline and contrast and pulled taut against the anterior wall of the stomach. The external disc was cinched. Contrast injection confirms positioning within the stomach. Several spot radiographic images were obtained in various obliquities for documentation. The patient tolerated procedure well without immediate post procedural complication. FINDINGS: After successful fluoroscopic guided placement, the gastrostomy tube is appropriately positioned with internal retention balloon against the ventral aspect of the gastric lumen. IMPRESSION: Successful fluoroscopic insertion of an 53 French balloon retention gastrostomy tube. The gastrostomy may be used immediately for medication administration and in 24 hrs for the initiation of feeds. Electronically Signed   By: Sandi Mariscal M.D.   On: 11/20/2021 14:17    sodium chloride   Intravenous Once   ascorbic acid  250 mg Per Tube BID   aspirin  81 mg Per Tube Daily   atorvastatin  80 mg Per Tube Daily   Chlorhexidine Gluconate Cloth  6 each Topical Daily   darbepoetin (ARANESP) injection - DIALYSIS  100 mcg Intravenous  Q Tue-HD   feeding supplement (NEPRO CARB STEADY)  237 mL Per Tube 5 X Daily   feeding supplement (PROSource TF20)  60 mL Per Tube TID   fentaNYL  1 patch Transdermal Q72H   fiber supplement (BANATROL TF)  60 mL Per Tube QID   folic acid  1 mg Per Tube Daily   Gerhardt's butt cream   Topical Daily   heparin injection (subcutaneous)  5,000 Units Subcutaneous Q8H   insulin aspart  0-20 Units Subcutaneous Q4H   leptospermum manuka honey  1 Application Topical Daily   midodrine  10 mg Oral 3 times per day on Tue Thu Sat   multivitamin  1 tablet Per Tube QHS   nutrition supplement (JUVEN)  1 packet Per Tube BID BM   mouth rinse  15 mL Mouth Rinse 4 times per day   pantoprazole  40 mg Per Tube BID   QUEtiapine  50 mg Per Tube QHS   sodium chloride flush  3 mL Intravenous Q12H   thiamine  100 mg Per Tube Daily    BMET    Component Value Date/Time   NA 136 11/22/2021 0046   NA 135 02/13/2021 0855   K 4.5 11/22/2021 0046   CL 94 (L) 11/22/2021 0046   CO2 28 11/22/2021 0046   GLUCOSE 84 11/22/2021 0046   BUN 46 (H)  11/22/2021 0046   BUN 9 02/13/2021 0855   CREATININE 4.41 (H) 11/22/2021 0046   CALCIUM 9.0 11/22/2021 0046   GFRNONAA 14 (L) 11/22/2021 0046   GFRAA >60 08/25/2019 2110   CBC    Component Value Date/Time   WBC 8.7 11/22/2021 0046   RBC 3.32 (L) 11/22/2021 0046   HGB 9.2 (L) 11/22/2021 0046   HGB 15.4 02/13/2021 0855   HCT 31.6 (L) 11/22/2021 0046   HCT 45.7 02/13/2021 0855   PLT 400 11/22/2021 0046   PLT 239 02/13/2021 0855   MCV 95.2 11/22/2021 0046   MCV 88 02/13/2021 0855   MCH 27.7 11/22/2021 0046   MCHC 29.1 (L) 11/22/2021 0046   RDW 17.2 (H) 11/22/2021 0046   RDW 11.3 (L) 02/13/2021 0855   LYMPHSABS 2.2 09/29/2021 1956   MONOABS 2.1 (H) 09/29/2021 1956   EOSABS 0.0 09/29/2021 1956   BASOSABS 0.0 09/29/2021 1956    Assessment/Plan:   ESRD - following ischemic ATN in setting of cardiogenic shock/cardiac arrest.  Started CRRT on 10/07/21 and  transitioned to IHD on 10/31/21.  Currently on TTS schedule.  Outpatient dialysis arrangements pending [facility] financial clearance.   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  H/o ECMO.  Currently on midodrine 20 mg tid. 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 Unstageable pressure ulcer of left buttock - wound care following. Hyperkalemia - unclear why his K is elevated since he had HD the day before.  Now improved.   Need to make sure his tube feeds are low K and phos.   May use Lokelma on Sunday and Monday.  Donetta Potts, MD [facility]

## 2021-11-22 NOTE — Progress Notes (Signed)
Brief Nutrition Note  Pt refusing Nepro boluses because they are causing him to have immediate diarrhea after administration. Nepro formula was utilized due to elevated potassium and phosphorus labs. Discussed with MD and RN. Will try transitioning pt to Osmolite 1.5 but continue to do bolus feeds.  Spoke with pt at bedside. Diet advanced to dysphagia 2 today. Pt reports that he ate a little for lunch but not much. He is willing to try a different tube feeding formula.  Bolus tube feeding regimen: - 1.5 cartons (356 ml) Osmolite 1.5 cal formula QID (total of 6 cartons daily) - PROSource TF20 60 ml TID  Bolus tube feeding regimen provides 2370 kcal, 149 grams of protein, and 1086 ml of H2O.   - Continue Juven BID per tube, each packet provides 80 calories, 8 grams of carbohydrate, 2.5 grams of protein (collagen), 7 grams of L-arginine and 7 grams of L-glutamine; supplement contains CaHMB, vitamins C, E, B12 and zinc to promote wound healing   - Continue vitamin C, zinc, folic acid, and renal MVI supplementation   - Continue Banatrol TF QID   RD will continue to follow pt during admission.   Gustavus Bryant, MS, RD, LDN Inpatient Clinical Dietitian Please see AMiON for contact information.

## 2021-11-22 NOTE — Progress Notes (Signed)
OT Note  Discussed with COTA. Pt making steady progress. Goals updated. Continue ot recommend post acute rehab.   Maurie Boettcher, OT/L   Acute OT Clinical Specialist Acute Rehabilitation Services Pager [phone] Office [phone]

## 2021-11-22 NOTE — Progress Notes (Signed)
Occupational Therapy Treatment Patient Details Name: [name] MRN: [id number] DOB: 06-24-1960 Today's Date: 11/22/2021   History of present illness Pt is a 61 y.o. male admitted 09/28/21 with SOB, bradycardia, hypotension. Workup for bilateral pleural effusion, pericardial effusion s/p VATS with pericardial window 8/19. Cardiac arrest with tamponade 8/20, intubated with bedside mediastinal exploration, ECMO via fem access. Pt with mediastinal hemorrhage s/p reexploration 8/21. To OR 8/28 for washout, ECMO decannulation, Impella placement. CRRT initiated 8/28. Brief PEA arrest 8/29 with DCCV to NSR.  S/p chest closure 9/2. S/p trach and impella removed 9/7. S/p wound vac removal 9/11. Course complicated by bouts of hypotension and bradycardia. Off CRRT 9/19. Trach decannulated 9/25. Tunned HD cath placement 9/25; iHD initiated. PMH includes CAD (s/p recent CABG 09/12/21), CKD2, DM, HTN, AKI, afib, CVA.   OT comments  Patient seated up on EOB upon entry. Patient instructed in RUE AROM, AAROM, and PROM exercises to wrist, hand, and digits to increase functional use. Patient provided U-Cuff and patient able to perform oral care with min assist using RUE.  Patient able to stand from EOB with mod assist and transferred tor recliner with min assist to steady and verbal cues for hand placement. Patient is making good gains and is motivated towards progress. Acute OT to continue to follow.    Recommendations for follow up therapy are one component of a multi-disciplinary discharge planning process, led by the attending physician.  Recommendations may be updated based on patient status, additional functional criteria and insurance authorization.    Follow Up Recommendations  Skilled nursing-short term rehab (<3 hours/day)    Assistance Recommended at Discharge Frequent or constant Supervision/Assistance  Patient can return home with the following  A lot of help with walking and/or transfers;A lot of help  with bathing/dressing/bathroom;Assistance with feeding;Assistance with cooking/housework;Direct supervision/assist for medications management;Direct supervision/assist for financial management;Assist for transportation;Help with stairs or ramp for entrance   Equipment Recommendations  BSC/3in1    Recommendations for Other Services      Precautions / Restrictions Precautions Precautions: Sternal;Fall;Other (comment) Precaution Booklet Issued: No Precaution Comments: painful sacral/buttocks wound, sternal wound Restrictions Weight Bearing Restrictions: No       Mobility Bed Mobility Overal bed mobility: Needs Assistance             General bed mobility comments: seated on EOB upon arrival    Transfers Overall transfer level: Needs assistance Equipment used: Rolling walker (2 wheels) Transfers: Sit to/from Stand, Bed to chair/wheelchair/BSC Sit to Stand: Mod assist     Step pivot transfers: Min assist     General transfer comment: patient required mod assist to stand from EOB to RW and min assist to step pivot into recliner     Balance Overall balance assessment: Needs assistance Sitting-balance support: Feet supported Sitting balance-Leahy Scale: Fair Sitting balance - Comments: seated on EOB upon entry   Standing balance support: Bilateral upper extremity supported Standing balance-Leahy Scale: Poor Standing balance comment: reliant on RW for support                           ADL either performed or assessed with clinical judgement   ADL Overall ADL's : Needs assistance/impaired     Grooming: Wash/dry hands;Wash/dry face;Oral care;Minimal assistance;With adaptive equipment;Sitting Grooming Details (indicate cue type and reason): patient used U-Cuff to perform oral care with toothette  General ADL Comments: performed grooming seated on EOB    Extremity/Trunk Assessment Upper Extremity Assessment RUE  Deficits / Details: minimal digit flexion; pronation 3/5; supination 4/5; digit extension 2/5; thumb adduciton 0/5; thumb extension 4/5; genralzied weakness in shoulder and shoulder girdle/scapula. ape hand deformity. RUE Sensation: decreased proprioception;decreased light touch RUE Coordination: decreased fine motor;decreased gross motor LUE Deficits / Details: generalized weakness; weaker proximally; able to achieve @ 90 degree FF and ABd; limited ER ; unabl eot reach back of head            Vision       Perception     Praxis      Cognition Arousal/Alertness: Awake/alert Behavior During Therapy: Flat affect Overall Cognitive Status: Impaired/Different from baseline Area of Impairment: Attention, Memory, Following commands, Safety/judgement, Awareness, Problem solving                   Current Attention Level: Selective Memory: Decreased short-term memory Following Commands: Follows one step commands consistently Safety/Judgement: Decreased awareness of safety, Decreased awareness of deficits Awareness: Emergent Problem Solving: Requires verbal cues, Slow processing General Comments: able to follow commands and express needs        Exercises Exercises: Hand exercises Hand Exercises Forearm Supination: AROM, Right, 5 reps Forearm Pronation: AAROM, 5 reps, Seated Wrist Flexion: AROM, Right, 10 reps, Seated Wrist Extension: AROM, Right, 15 reps Wrist Ulnar Deviation: AAROM, Right, 15 reps Wrist Radial Deviation: AAROM, Right, 15 reps Digit Composite Flexion: PROM, AAROM, Right, 20 reps Composite Extension: AROM, AAROM, 20 reps, Right Digit Composite Abduction: PROM, Right, 15 reps Digit Composite Adduction: PROM, Right, 15 reps Thumb Abduction: Right, PROM, 15 reps Thumb Adduction: PROM, Right, 15 reps Opposition: PROM, Right, 10 reps    Shoulder Instructions       General Comments      Pertinent Vitals/ Pain       Pain Assessment Pain Assessment:  Faces Faces Pain Scale: Hurts even more Pain Location: perineal wounds Pain Descriptors / Indicators: Sore Pain Intervention(s): Limited activity within patient's tolerance, Repositioned, Monitored during session  Home Living                                          Prior Functioning/Environment              Frequency  Min 2X/week        Progress Toward Goals  OT Goals(current goals can now be found in the care plan section)  Progress towards OT goals: Progressing toward goals  Acute Rehab OT Goals Patient Stated Goal: get better OT Goal Formulation: With patient Time For Goal Achievement: 11/18/21 Potential to Achieve Goals: Good ADL Goals Pt Will Perform Grooming: with set-up;sitting Pt Will Perform Upper Body Bathing: with set-up;sitting Pt Will Perform Upper Body Dressing: with min assist;sitting Pt Will Transfer to Toilet: with min guard assist;stand pivot transfer;bedside commode Pt Will Perform Toileting - Clothing Manipulation and hygiene: with min assist;sit to/from stand;sitting/lateral leans Pt/caregiver will Perform Home Exercise Program: Increased strength;Increased ROM;Both right and left upper extremity;With minimal assist Additional ADL Goal #1: Pt will demosntrate sustained attention for grooming task in nondistracting environment Additional ADL Goal #2: Pt will complete bed mobility with mod A in preparation for ADL tasks Additional ADL Goal #3: Pt will grasp/release objects using functional pinch pattern with necessary AE/ splinting to increase functional use R hand  Plan  Discharge plan remains appropriate    Co-evaluation                 AM-PAC OT "6 Clicks" Daily Activity     Outcome Measure   Help from another person eating meals?: Total Help from another person taking care of personal grooming?: A Little Help from another person toileting, which includes using toliet, bedpan, or urinal?: A Lot Help from another  person bathing (including washing, rinsing, drying)?: A Lot Help from another person to put on and taking off regular upper body clothing?: A Lot Help from another person to put on and taking off regular lower body clothing?: A Lot 6 Click Score: 12    End of Session Equipment Utilized During Treatment: Gait belt;Rolling walker (2 wheels)  OT Visit Diagnosis: Unsteadiness on feet (R26.81);Other abnormalities of gait and mobility (R26.89);Muscle weakness (generalized) (M62.81);Other symptoms and signs involving cognitive function;Pain Pain - Right/Left: Right Pain - part of body: Hand   Activity Tolerance Patient tolerated treatment well   Patient Left in chair;with call bell/phone within reach;with chair alarm set   Nurse Communication Mobility status        Time: 7673-4193 OT Time Calculation (min): 25 min  Charges: OT General Charges $OT Visit: 1 Visit OT Treatments $Self Care/Home Management : 8-22 mins $Therapeutic Exercise: 8-22 mins  [name], [place]  Office [phone]   Trixie Dredge 11/22/2021, 10:50 AM

## 2021-11-23 LAB — CBC
HCT: 35.5 % — ABNORMAL LOW (ref 39.0–52.0)
HCT: 32.3 % — ABNORMAL LOW (ref 39.0–52.0)
Hemoglobin: 10.6 g/dL — ABNORMAL LOW (ref 13.0–17.0)
Hemoglobin: 10 g/dL — ABNORMAL LOW (ref 13.0–17.0)
MCH: 28 pg (ref 26.0–34.0)
MCH: 28.7 pg (ref 26.0–34.0)
MCHC: 29.9 g/dL — ABNORMAL LOW (ref 30.0–36.0)
MCHC: 31 g/dL (ref 30.0–36.0)
MCV: 93.9 fL (ref 80.0–100.0)
MCV: 92.8 fL (ref 80.0–100.0)
Platelets: 446 10*3/uL — ABNORMAL HIGH (ref 150–400)
Platelets: 416 10*3/uL — ABNORMAL HIGH (ref 150–400)
RBC: 3.78 MIL/uL — ABNORMAL LOW (ref 4.22–5.81)
RBC: 3.48 MIL/uL — ABNORMAL LOW (ref 4.22–5.81)
RDW: 17.2 % — ABNORMAL HIGH (ref 11.5–15.5)
RDW: 17.1 % — ABNORMAL HIGH (ref 11.5–15.5)
WBC: 9.2 10*3/uL (ref 4.0–10.5)
WBC: 9 10*3/uL (ref 4.0–10.5)
nRBC: 0 % (ref 0.0–0.2)
nRBC: 0.2 % (ref 0.0–0.2)

## 2021-11-23 LAB — GLUCOSE, CAPILLARY
Glucose-Capillary: 108 mg/dL — ABNORMAL HIGH (ref 70–99)
Glucose-Capillary: 118 mg/dL — ABNORMAL HIGH (ref 70–99)
Glucose-Capillary: 79 mg/dL (ref 70–99)
Glucose-Capillary: 81 mg/dL (ref 70–99)
Glucose-Capillary: 94 mg/dL (ref 70–99)
Glucose-Capillary: 98 mg/dL (ref 70–99)

## 2021-11-23 LAB — RENAL FUNCTION PANEL
Albumin: 3.1 g/dL — ABNORMAL LOW (ref 3.5–5.0)
Anion gap: 22 — ABNORMAL HIGH (ref 5–15)
BUN: 59 mg/dL — ABNORMAL HIGH (ref 8–23)
CO2: 21 mmol/L — ABNORMAL LOW (ref 22–32)
Calcium: 9.5 mg/dL (ref 8.9–10.3)
Chloride: 93 mmol/L — ABNORMAL LOW (ref 98–111)
Creatinine, Ser: 6.7 mg/dL — ABNORMAL HIGH (ref 0.61–1.24)
GFR, Estimated: 9 mL/min — ABNORMAL LOW (ref >60)
Glucose, Bld: 86 mg/dL (ref 70–99)
Phosphorus: 5.6 mg/dL — ABNORMAL HIGH (ref 2.5–4.6)
Potassium: 4.7 mmol/L (ref 3.5–5.1)
Sodium: 136 mmol/L (ref 135–145)

## 2021-11-23 LAB — MAGNESIUM: Magnesium: 1.9 mg/dL (ref 1.7–2.4)

## 2021-11-23 MED ORDER — HEPARIN SODIUM (PORCINE) 1000 UNIT/ML IJ SOLN
INTRAMUSCULAR | Status: AC
  Administered 2021-11-23: 1000 [IU]
  Filled 2021-11-23: qty 4

## 2021-11-23 NOTE — Progress Notes (Addendum)
      [address]       [place] [address]             [phone]      3 Days Post-Op Procedure(s) (LRB): G-Tube Placement (N/A)  Subjective:  Patient just got back in bed from BM.  States his backside is really sore.  He is apprehensive about going to SNF facility  Objective: Vital signs in last 24 hours: Temp:  [97.8 F (36.6 C)-98.1 F (36.7 C)] 98.1 F (36.7 C) (10/14 0808) Pulse Rate:  [91-100] 91 (10/14 0808) Cardiac Rhythm: Normal sinus rhythm (10/14 0756) Resp:  [20-23] 20 (10/14 0808) BP: (105-119)/(64-88) 115/71 (10/14 0808) SpO2:  [96 %-99 %] 99 % (10/14 0808)  Intake/Output this shift: Total I/O In: 120 [P.O.:120] Out: -   General appearance: alert, cooperative, and no distress Heart: irregularly irregular rhythm Lungs: clear to auscultation bilaterally Abdomen: soft, non-tender; bowel sounds normal; no masses,  no organomegaly Extremities: extremities normal, atraumatic, no cyanosis or edema Wound: sternal incisions clean, sacral wound  Lab Results: Recent Labs    11/21/21 0940 11/22/21 0046  WBC 7.4 8.7  HGB 9.5* 9.2*  HCT 32.5* 31.6*  PLT 442* 400   BMET:  Recent Labs    11/21/21 0940 11/22/21 0046  NA 134* 136  K 6.5* 4.5  CL 93* 94*  CO2 21* 28  GLUCOSE 90 84  BUN 119* 46*  CREATININE 7.87* 4.41*  CALCIUM 9.7 9.0    PT/INR: No results for input(s): "LABPROT", "INR" in the last 72 hours. ABG    Component Value Date/Time   PHART 7.301 (L) 10/22/2021 2354   HCO3 19.3 (L) 10/22/2021 2354   TCO2 20 (L) 10/22/2021 2354   ACIDBASEDEF 7.0 (H) 10/22/2021 2354   O2SAT 71.7 11/02/2021 0439   CBG (last 3)  Recent Labs    11/22/21 2343 11/23/21 0346 11/23/21 0812  GLUCAP 70 79 81    Assessment/Plan: S/P Procedure(s) (LRB): G-Tube Placement (N/A)  CV- back in Atrial Fibrillation this morning, Bp remains low- continue Midodrine 10 mg TID Pulm- off oxygen, continue IS Renal- ESRD, for dialysis today,  nephrology following GI- SLP following patient started on dysphagia 2 diet, eating some but states the food isn't very good, continue tube feedings for now Wound care- continue per recommendations, no evidence of infections Deconditioning- patient has medicaid pending, is very hesitant to go to SNF, unsure if he could tolerate CIR at this point... he has been approved for LOG and TOC is working on placement, can ask CIR to see if he can participate with PT   LOS: 56 days    Ellwood Handler, PA-C 11/23/2021 Patient in HD unit today Has been refusing meal trays and bolus feedings due to persistent diarrhea Able to take a few steps in his room  patient examined and medical record reviewed,agree with above note. Dahlia Byes 11/23/2021

## 2021-11-23 NOTE — Progress Notes (Signed)
Patient has refused 10am meds, either thru tube or having them changed to PO, he wants to go to dialysis without an upset stomach per patient

## 2021-11-23 NOTE — Progress Notes (Signed)
Sent text page to Dr Cyndia Bent, dialysis haS reported patient haD 2.5L removed in his HD session today, his heart rate has since been 120-125---I do not have med ordered for this heart rate, asymptomatic---

## 2021-11-23 NOTE — Progress Notes (Signed)
Received patient in bed to unit.  Alert and oriented.  Informed consent signed and in chart.   Treatment initiated: 1401 Treatment completed: 1807  Patient tolerated well.  Transported back to the room  Alert, without acute distress.  Hand-off given to patient's nurse.   Access used: Catheter Access issues: none  Total UF removed: 2000 Medication(s) given: none Post HD VS: 98.7, 100/59(71), HR-108, RR-27, SP02-100 Post HD weight: 85.5kg   [facility]

## 2021-11-23 NOTE — Progress Notes (Signed)
Mobility Specialist Progress Note    11/23/21 1331  Mobility  Activity Off unit   Pt at HD. Will f/u as schedule permits.   Hildred Alamin Mobility Specialist  Secure Chat Only

## 2021-11-23 NOTE — Progress Notes (Signed)
Patient ID: RAAHIM SHARTZER, male   DOB: February 10, 1961, 61 y.o.   MRN: [id number] S: No new complaints O:BP 115/71 (BP Location: Left Arm)   Pulse 91   Temp 98.1 F (36.7 C) (Oral)   Resp 20   Ht 5\' 7"  (1.702 m)   Wt 87.5 kg   SpO2 99%   BMI 30.21 kg/m   Intake/Output Summary (Last 24 hours) at 11/23/2021 1039 Last data filed at 11/23/2021 0800 Gross per 24 hour  Intake 120 ml  Output --  Net 120 ml   Intake/Output: No intake/output data recorded.  Intake/Output this shift:  Total I/O In: 120 [P.O.:120] Out: -  Weight change:  Gen: NAD CVS: RRR Resp:CTA Abd: +BS, soft, mildly tender NAT:FTDDU pretibial edema  Recent Labs  Lab 11/17/21 0936 11/18/21 0011 11/19/21 0646 11/20/21 0009 11/21/21 0013 11/21/21 0940 11/22/21 0046  NA 135 135 135 134* 133* 134* 136  K 4.6 4.8 5.0 5.5* 6.8* 6.5* 4.5  CL 96* 94* 96* 95* 92* 93* 94*  CO2 26 26 24 23 23  21* 28  GLUCOSE 128* 83 114* 84 118* 90 84  BUN 51* 76* 99* 84* 111* 119* 46*  CREATININE 4.22* 5.03* 6.65* 5.85* 7.29* 7.87* 4.41*  ALBUMIN 2.6* 2.6* 2.6* 2.8* 2.9* 3.0* 2.9*  CALCIUM 9.0 9.1 9.2 9.2 9.7 9.7 9.0  PHOS 3.8 3.4 4.1 3.9 5.6* 6.2* 4.0   Liver Function Tests: Recent Labs  Lab 11/21/21 0013 11/21/21 0940 11/22/21 0046  ALBUMIN 2.9* 3.0* 2.9*   No results for input(s): "LIPASE", "AMYLASE" in the last 168 hours. No results for input(s): "AMMONIA" in the last 168 hours. CBC: Recent Labs  Lab 11/19/21 0646 11/20/21 0009 11/21/21 0940 11/22/21 0046  WBC 9.0 8.2 7.4 8.7  HGB 9.0* 9.5* 9.5* 9.2*  HCT 29.4* 31.5* 32.5* 31.6*  MCV 92.5 93.2 95.9 95.2  PLT 434* 416* 442* 400   Cardiac Enzymes: No results for input(s): "CKTOTAL", "CKMB", "CKMBINDEX", "TROPONINI" in the last 168 hours. CBG: Recent Labs  Lab 11/22/21 1621 11/22/21 2003 11/22/21 2343 11/23/21 0346 11/23/21 0812  GLUCAP 93 131* 70 79 81    Iron Studies: No results for input(s): "IRON", "TIBC", "TRANSFERRIN", "FERRITIN" in the last 72  hours. Studies/Results: DG Swallowing Func-Speech Pathology  Result Date: 11/22/2021 Table formatting from the original result was not included. Objective Swallowing Evaluation: Type of Study: MBS-Modified Barium Swallow Study  Patient Details Name: THERMON ZULAUF MRN: [id number] Date of Birth: 05-Oct-1960 Today's Date: 11/22/2021 Time: SLP Start Time (ACUTE ONLY): 1033 -SLP Stop Time (ACUTE ONLY): 1048 SLP Time Calculation (min) (ACUTE ONLY): 15 min Past Medical History: Past Medical History: Diagnosis Date  AKI (acute kidney injury) ([place])   pt unaware of this  Anginal pain ([place])   CAD (coronary artery disease)   a. 03/2015 NSTEMI: LHC with severe 3V CAD  (70% mid RCA, 95% OM1, 90% distal LCx, 90% OM3, 80% prox LAD and 90% ost D1) s/p DES to mLAD w/ small dissction Rx with DES, staged ost Ramus PCI/DES and dLCx s/p PCI/DES   Chest pain 12/24/2020  Diabetes mellitus type 2 in obese [facility])   Diverticulosis   Dyspnea   Dyspnea on exertion 03/16/2015  Dyspnea on exertion  Family history of adverse reaction to anesthesia   patient father- pt states after anesthesia his father "developed dementia"  GERD (gastroesophageal reflux disease)   Hypercholesteremia   Hypertension associated with diabetes ([place]) 03/16/2015  hypertension  NSTEMI (non-ST elevated myocardial infarction) ([place]) 03/17/2015  Obesity   Stroke [facility]) 2022  pt states he had a "mini stroke" during cardiac catheterization  Tobacco abuse  Past Surgical History: Past Surgical History: Procedure Laterality Date  APPLICATION OF WOUND VAC N/A 06/04/9561  Procedure: APPLICATION OF WOUND VAC;  Surgeon: Dahlia Byes, MD;  Location: [place];  Service: Thoracic;  Laterality: N/A;  CANNULATION FOR ECMO (EXTRACORPOREAL MEMBRANE OXYGENATION) N/A 09/29/2021  Procedure: CANNULATION FOR ECMO (EXTRACORPOREAL MEMBRANE OXYGENATION);  Surgeon: Lajuana Matte, MD;  Location: [place];  Service: Open Heart Surgery;  Laterality: N/A;  CARDIAC CATHETERIZATION N/A 03/19/2015   Procedure: Left Heart Cath and Coronary Angiography;  Surgeon: Lorretta Harp, MD;  Location: [place] CV LAB;  Service: Cardiovascular;  Laterality: N/A;  CARDIAC CATHETERIZATION N/A 03/20/2015  Procedure: Coronary Stent Intervention;  Surgeon: Lorretta Harp, MD;  Location: [place] CV LAB;  Service: Cardiovascular;  Laterality: N/A;  CARDIAC CATHETERIZATION N/A 03/22/2015  Procedure: Coronary Stent Intervention;  Surgeon: Lorretta Harp, MD;  Location: [place] CV LAB;  Service: Cardiovascular;  Laterality: N/A;  CORONARY ARTERY BYPASS GRAFT N/A 09/12/2021  Procedure: CORONARY ARTERY BYPASS GRAFTING (CABG) X 5 USING LEFT INTERNAL MAMMARY ARTERY AND ENDOSCOPICALLY HARVESTED RIGHT GREATER SAPHENOUS VEIN.;  Surgeon: [name], MD;  Location: [place];  Service: Open Heart Surgery;  Laterality: N/A;  EXPLORATION POST OPERATIVE OPEN HEART N/A 09/30/2021  Procedure: EXPLORATION POST OPERATIVE OPEN HEART WASHOUT;  Surgeon: Lajuana Matte, MD;  Location: [place];  Service: Open Heart Surgery;  Laterality: N/A;  IR FLUORO GUIDE CV LINE LEFT  11/04/2021  IR GASTROSTOMY TUBE MOD SED  11/15/2021  IR GASTROSTOMY TUBE MOD SED  11/20/2021  IR US GUIDE VASC ACCESS LEFT  11/04/2021  LEFT HEART CATH AND CORONARY ANGIOGRAPHY N/A 12/25/2020  Procedure: LEFT HEART CATH AND CORONARY ANGIOGRAPHY;  Surgeon: Troy Sine, MD;  Location: [place] CV LAB;  Service: Cardiovascular;  Laterality: N/A;  LEFT HEART CATH AND CORONARY ANGIOGRAPHY N/A 12/26/2020  Procedure: LEFT HEART CATH AND CORONARY ANGIOGRAPHY;  Surgeon: Troy Sine, MD;  Location: [place] CV LAB;  Service: Cardiovascular;  Laterality: N/A;  MEDIASTINAL EXPLORATION N/A 10/03/2021  Procedure: MEDIASTINAL WASHOUT;  Surgeon: Dahlia Byes, MD;  Location: [place];  Service: Thoracic;  Laterality: N/A;  PUMP STANDBY  MEDIASTINAL EXPLORATION N/A 10/12/2021  Procedure: MEDIASTINAL WASHOUT;  Surgeon: [name], MD;  Location: [place];  Service: Thoracic;   Laterality: N/A;  PERCUTANEOUS TRACHEOSTOMY N/A 10/17/2021  Procedure: PERCUTANEOUS TRACHEOSTOMY USING SHILEY FLEXIBLE 8 mm CUFFED [place].;  Surgeon: Candee Furbish, MD;  Location: [place];  Service: Pulmonary;  Laterality: N/A;  Percutaneous tracheostomy  PERICARDIAL WINDOW Right 09/28/2021  Procedure: PERICARDIAL WINDOW;  Surgeon: Lajuana Matte, MD;  Location: [place];  Service: Thoracic;  Laterality: Right;  Right VATS.  Lazy lateral.  double lumen ET tube  PLACEMENT OF IMPELLA LEFT VENTRICULAR ASSIST DEVICE N/A 10/07/2021  Procedure: INSERTION OF RIGHT AXILLARY IMPELLA, DECANNULATION OF ECMO, AND MEDIASTINAL WASHOUT;  Surgeon: [name], MD;  Location: [place];  Service: Open Heart Surgery;  Laterality: N/A;  Open right axillary with 8 mm Hemashield Platinum Vascular graft.  RADIOLOGY WITH ANESTHESIA N/A 11/20/2021  Procedure: G-Tube Placement;  Surgeon: Sandi Mariscal, MD;  Location: [place];  Service: Radiology;  Laterality: N/A;  REMOVAL OF IMPELLA LEFT VENTRICULAR ASSIST DEVICE Right 10/17/2021  Procedure: REMOVAL OF IMPELLA LEFT VENTRICULAR ASSIST DEVICE;  Surgeon: [name], MD;  Location: [place];  Service: Open Heart Surgery;  Laterality: Right;  STERNAL CLOSURE N/A 10/12/2021  Procedure: STERNAL CLOSURE;  Surgeon: [name], MD;  [facility];  Service: Thoracic;  Laterality: N/A;  TEE WITHOUT CARDIOVERSION N/A 09/12/2021  Procedure: TRANSESOPHAGEAL ECHOCARDIOGRAM (TEE);  Surgeon: [name], MD;  Location: [place];  Service: Open Heart Surgery;  Laterality: N/A;  TEE WITHOUT CARDIOVERSION N/A 09/29/2021  Procedure: TRANSESOPHAGEAL ECHOCARDIOGRAM (TEE);  Surgeon: Lajuana Matte, MD;  Location: [place];  Service: Open Heart Surgery;  Laterality: N/A;  TEE WITHOUT CARDIOVERSION N/A 10/03/2021  Procedure: TRANSESOPHAGEAL ECHOCARDIOGRAM (TEE);  Surgeon: Dahlia Byes, MD;  Location: [place];  Service: Thoracic;  Laterality: N/A;  TEE WITHOUT CARDIOVERSION  10/07/2021  Procedure: TRANSESOPHAGEAL  ECHOCARDIOGRAM (TEE);  Surgeon: [name], MD;  [facility];  Service: Open Heart Surgery;;  TEE WITHOUT CARDIOVERSION N/A 10/12/2021  Procedure: TRANSESOPHAGEAL ECHOCARDIOGRAM (TEE);  Surgeon: [name], MD;  Location: [facility] OR;  Service: Thoracic;  Laterality: N/A;  TEE WITHOUT CARDIOVERSION N/A 10/17/2021  Procedure: TRANSESOPHAGEAL ECHOCARDIOGRAM (TEE);  Surgeon: [name], MD;  Location: [place];  Service: Open Heart Surgery;  Laterality: N/A;  TESTICLE SURGERY  1970  pt states testicle was ascended and had to be "pulled down"  TONSILLECTOMY    as a child [name], bradycardia and hypotension. Pt with bil pleural effusion and pericardial effusion s/p VATS with pericardial window 8/19. 8/20 cardiac tamponade with arrest,  intubated with bedside mediastinal exploration and ECMO via fem access. OR 8/21 for reexploration due to mediastinal hemorrhage. 8/28 OR for washout with ECMO decannulation and Impella placed. 8/29 brief PEA arrest with DCCV to NSR. 9/2 chest closure. 9/7 trach and Impella removed.9/11 VAC removed. Decannulated 9/26. PMHx: CAD s/p recent CABG x5 (admitted  8/3-11/23 with post op Afib), CKD stage II, DM, HTN, AKI, Afib and CVA  Subjective: alert but very uncomfortable in [place] chair, agreeable but only if study is kept brief  Recommendations for follow up therapy are one component of a multi-disciplinary discharge planning process, led by the attending physician.  Recommendations may be updated based on patient status, additional functional criteria and insurance authorization. Assessment / Plan / Recommendation   11/22/2021  11:00 AM Clinical Impressions Clinical Impression Pt shows great improvements on MBS today compared to oropharyngeal function on previous FEES. He has an oral dysphagia that includes piecemeal swallowing and bilateral buccal pocketing with thin liquids, purees, and solids. He responded well to verbal cues to swallow again, clearing  more of his oral residue but not completely clearing his oral cavity. Occasional premature spillage was observed with thin liquids, and he needs somewhat prolonged mastication time with a graham cracker. Pharyngeally there is still some weakness with reduced hyoid excursion and base of tongue retraction, but his pharyngeal residue is significantly reduced. There is a trace coating along his base of tongue and into his valleculae, but no significant build up of pharyngeal residue regardless of texture. Recommend that pt initiate Dys 2 diet and thin liquids. As pt can demonstrate improving oral cavity clearance, he will likely be able to advance his diet clinically at this point. Would try to optimize positioning during PO intake, which may be a little more challenging for him given wounds, but he has demonstrated his ability to sit upright in the chair and the bed. SLP Visit Diagnosis Dysphagia, oropharyngeal phase (R13.12) Impact on safety and function Mild aspiration risk;Risk for inadequate nutrition/hydration     11/22/2021  11:00 AM  Treatment Recommendations Treatment Recommendations Therapy as outlined in treatment plan below     11/22/2021  11:00 AM Prognosis Prognosis for Safe Diet Advancement Good Barriers to Reach Goals Cognitive deficits   11/22/2021  11:00 AM Diet Recommendations SLP Diet Recommendations Dysphagia 2 (Fine chop) solids;Thin liquid Liquid Administration via Cup;Straw Medication Administration Whole meds with liquid Compensations Minimize environmental distractions;Slow rate;Small sips/bites;Lingual sweep for clearance of pocketing Postural Changes Seated upright at 90 degrees     11/22/2021  11:00 AM Other Recommendations Oral Care Recommendations Oral care BID Other Recommendations Have oral suction available Follow Up Recommendations Skilled nursing-short term rehab (<3 hours/day) Assistance recommended at discharge Frequent or constant Supervision/Assistance Functional Status Assessment  Patient has had a recent decline in their functional status and demonstrates the ability to make significant improvements in function in a reasonable and predictable amount of time.   11/22/2021  11:00 AM Frequency and Duration  Speech Therapy Frequency (ACUTE ONLY) min 2x/week Treatment Duration 2 weeks     11/22/2021  11:00 AM Oral Phase Oral Phase Impaired Oral - Thin Cup Premature spillage;Piecemeal swallowing;Right pocketing in lateral sulci;Left pocketing in lateral sulci Oral - Thin Straw Premature spillage;Piecemeal swallowing;Right pocketing in lateral sulci;Left pocketing in lateral sulci Oral - Puree Piecemeal swallowing;Right pocketing in lateral sulci;Left pocketing in lateral sulci Oral - Regular Piecemeal swallowing;Right pocketing in lateral sulci;Left pocketing in lateral sulci;Impaired mastication Oral - Pill [facility]    11/22/2021  11:00 AM Pharyngeal Phase Pharyngeal Phase Impaired Pharyngeal- Nectar Teaspoon NT Pharyngeal- Thin Teaspoon NT Pharyngeal- Thin Cup Reduced anterior laryngeal mobility;Reduced tongue base retraction;Pharyngeal residue - valleculae Pharyngeal- Thin Straw Reduced anterior laryngeal mobility;Reduced tongue base retraction;Pharyngeal residue - valleculae Pharyngeal- Puree Reduced anterior laryngeal mobility;Reduced tongue base retraction;Pharyngeal residue - valleculae Pharyngeal- Regular Reduced anterior laryngeal mobility;Reduced tongue base retraction;Pharyngeal residue - valleculae Pharyngeal- Pill Reduced anterior laryngeal mobility;Reduced tongue base retraction    11/22/2021  11:00 AM Cervical Esophageal Phase  Cervical Esophageal Phase [facility] Osie Bond., M.A. CCC-SLP Acute Rehabilitation Services Office [phone] Secure chat preferred 11/22/2021, 11:48 AM                      sodium chloride   Intravenous Once   aspirin  81 mg Per Tube Daily   atorvastatin  80 mg Per Tube Daily   Chlorhexidine Gluconate Cloth  6 each Topical Daily   darbepoetin (ARANESP) injection -  DIALYSIS  100 mcg Intravenous Q Tue-HD   feeding supplement (OSMOLITE 1.5 CAL)  356 mL Per Tube QID   feeding supplement (PROSource TF20)  60 mL Per Tube TID   fentaNYL  1 patch Transdermal Q72H   fiber supplement (BANATROL TF)  60 mL Per Tube QID   folic acid  1 mg Per Tube Daily   Gerhardt's butt cream   Topical Daily   heparin injection (subcutaneous)  5,000 Units Subcutaneous Q8H   insulin aspart  0-20 Units Subcutaneous Q4H   leptospermum manuka honey  1 Application Topical Daily   midodrine  10 mg Oral 3 times per day on Tue Thu Sat   multivitamin  1 tablet Per Tube QHS   nutrition supplement (JUVEN)  1 packet Per Tube BID BM   mouth rinse  15 mL Mouth Rinse 4 times per day   pantoprazole  40 mg Per Tube BID   QUEtiapine  50 mg Per Tube QHS   sodium chloride flush  3 mL Intravenous Q12H   thiamine  100 mg Per Tube Daily  BMET    Component Value Date/Time   NA 136 11/22/2021 0046   NA 135 02/13/2021 0855   K 4.5 11/22/2021 0046   CL 94 (L) 11/22/2021 0046   CO2 28 11/22/2021 0046   GLUCOSE 84 11/22/2021 0046   BUN 46 (H) 11/22/2021 0046   BUN 9 02/13/2021 0855   CREATININE 4.41 (H) 11/22/2021 0046   CALCIUM 9.0 11/22/2021 0046   GFRNONAA 14 (L) 11/22/2021 0046   GFRAA >60 08/25/2019 2110   CBC    Component Value Date/Time   WBC 8.7 11/22/2021 0046   RBC 3.32 (L) 11/22/2021 0046   HGB 9.2 (L) 11/22/2021 0046   HGB 15.4 02/13/2021 0855   HCT 31.6 (L) 11/22/2021 0046   HCT 45.7 02/13/2021 0855   PLT 400 11/22/2021 0046   PLT 239 02/13/2021 0855   MCV 95.2 11/22/2021 0046   MCV 88 02/13/2021 0855   MCH 27.7 11/22/2021 0046   MCHC 29.1 (L) 11/22/2021 0046   RDW 17.2 (H) 11/22/2021 0046   RDW 11.3 (L) 02/13/2021 0855   LYMPHSABS 2.2 09/29/2021 1956   MONOABS 2.1 (H) 09/29/2021 1956   EOSABS 0.0 09/29/2021 1956   BASOSABS 0.0 09/29/2021 1956    Assessment/Plan:   ESRD - following ischemic ATN in setting of cardiogenic shock/cardiac arrest.  Started CRRT on  10/07/21 and transitioned to IHD on 10/31/21.  Currently on TTS schedule.  Outpatient dialysis arrangements pending [facility] financial clearance.  He does not eat before HD due to diarrhea and requests to be on first shift moving forward so he can eat when it is over. 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  H/o ECMO.  Currently on midodrine 20 mg tid. 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 Unstageable pressure ulcer of left buttock - wound care following. Hyperkalemia - unclear why his K is elevated since he had HD the day before.  Now improved.   Need to make sure his tube feeds are low K and phos.   May use Lokelma on Sunday and Monday.  Donetta Potts, MD [facility]

## 2021-11-24 LAB — RENAL FUNCTION PANEL
Albumin: 3.1 g/dL — ABNORMAL LOW (ref 3.5–5.0)
Anion gap: 15 (ref 5–15)
BUN: 23 mg/dL (ref 8–23)
CO2: 27 mmol/L (ref 22–32)
Calcium: 9 mg/dL (ref 8.9–10.3)
Chloride: 95 mmol/L — ABNORMAL LOW (ref 98–111)
Creatinine, Ser: 3.8 mg/dL — ABNORMAL HIGH (ref 0.61–1.24)
GFR, Estimated: 17 mL/min — ABNORMAL LOW (ref >60)
Glucose, Bld: 82 mg/dL (ref 70–99)
Phosphorus: 3.6 mg/dL (ref 2.5–4.6)
Potassium: 3.9 mmol/L (ref 3.5–5.1)
Sodium: 137 mmol/L (ref 135–145)

## 2021-11-24 LAB — GLUCOSE, CAPILLARY
Glucose-Capillary: 83 mg/dL (ref 70–99)
Glucose-Capillary: 101 mg/dL — ABNORMAL HIGH (ref 70–99)
Glucose-Capillary: 106 mg/dL — ABNORMAL HIGH (ref 70–99)
Glucose-Capillary: 104 mg/dL — ABNORMAL HIGH (ref 70–99)
Glucose-Capillary: 91 mg/dL (ref 70–99)
Glucose-Capillary: 143 mg/dL — ABNORMAL HIGH (ref 70–99)

## 2021-11-24 LAB — CBC
HCT: 34.9 % — ABNORMAL LOW (ref 39.0–52.0)
Hemoglobin: 10.3 g/dL — ABNORMAL LOW (ref 13.0–17.0)
MCH: 27.7 pg (ref 26.0–34.0)
MCHC: 29.5 g/dL — ABNORMAL LOW (ref 30.0–36.0)
MCV: 93.8 fL (ref 80.0–100.0)
Platelets: 391 10*3/uL (ref 150–400)
RBC: 3.72 MIL/uL — ABNORMAL LOW (ref 4.22–5.81)
RDW: 17.1 % — ABNORMAL HIGH (ref 11.5–15.5)
WBC: 9 10*3/uL (ref 4.0–10.5)
nRBC: 0.2 % (ref 0.0–0.2)

## 2021-11-24 LAB — MAGNESIUM: Magnesium: 1.7 mg/dL (ref 1.7–2.4)

## 2021-11-24 MED ORDER — FOLIC ACID 1 MG PO TABS
1.0000 mg | ORAL_TABLET | Freq: Every day | ORAL | Status: DC
  Administered 2021-11-24 – 2021-12-03 (×9): 1 mg via ORAL
  Filled 2021-11-24 (×9): qty 1

## 2021-11-24 MED ORDER — JUVEN PO PACK
1.0000 | PACK | Freq: Two times a day (BID) | ORAL | Status: DC
  Administered 2021-11-24 – 2021-11-25 (×2): 1 via ORAL
  Filled 2021-11-24 (×2): qty 1

## 2021-11-24 MED ORDER — QUETIAPINE FUMARATE 50 MG PO TABS
50.0000 mg | ORAL_TABLET | Freq: Every day | ORAL | Status: DC
  Administered 2021-11-24 – 2021-12-02 (×9): 50 mg via ORAL
  Filled 2021-11-24 (×10): qty 1

## 2021-11-24 MED ORDER — PANTOPRAZOLE SODIUM 40 MG PO TBEC
40.0000 mg | DELAYED_RELEASE_TABLET | Freq: Two times a day (BID) | ORAL | Status: DC
  Administered 2021-11-24 – 2021-12-03 (×19): 40 mg via ORAL
  Filled 2021-11-24 (×19): qty 1

## 2021-11-24 MED ORDER — SODIUM CHLORIDE 0.9 % IV SOLN: INTRAVENOUS | Status: DC

## 2021-11-24 MED ORDER — OXYCODONE HCL 5 MG PO TABS
5.0000 mg | ORAL_TABLET | Freq: Four times a day (QID) | ORAL | Status: DC | PRN
  Administered 2021-11-26 – 2021-12-03 (×5): 5 mg via ORAL
  Filled 2021-11-24 (×5): qty 1

## 2021-11-24 MED ORDER — LOPERAMIDE HCL 1 MG/7.5ML PO SUSP
4.0000 mg | Freq: Three times a day (TID) | ORAL | Status: DC
  Administered 2021-11-26 – 2021-12-03 (×6): 4 mg via ORAL
  Filled 2021-11-24 (×29): qty 30

## 2021-11-24 MED ORDER — METOPROLOL TARTRATE 5 MG/5ML IV SOLN: 2.5000 mg | INTRAVENOUS | Status: DC | PRN

## 2021-11-24 MED ORDER — CLONAZEPAM 0.5 MG PO TABS
0.5000 mg | ORAL_TABLET | Freq: Three times a day (TID) | ORAL | Status: DC | PRN
  Administered 2021-12-03: 0.5 mg via ORAL
  Filled 2021-11-24: qty 1

## 2021-11-24 MED ORDER — ASPIRIN 81 MG PO CHEW
81.0000 mg | CHEWABLE_TABLET | Freq: Every day | ORAL | Status: DC
  Administered 2021-11-25 – 2021-12-03 (×8): 81 mg via ORAL
  Filled 2021-11-24 (×9): qty 1

## 2021-11-24 MED ORDER — LOPERAMIDE HCL 1 MG/7.5ML PO SUSP
4.0000 mg | Freq: Three times a day (TID) | ORAL | Status: DC
  Filled 2021-11-24 (×2): qty 30

## 2021-11-24 MED ORDER — THIAMINE MONONITRATE 100 MG PO TABS
100.0000 mg | ORAL_TABLET | Freq: Every day | ORAL | Status: DC
  Administered 2021-11-24 – 2021-12-03 (×9): 100 mg via ORAL
  Filled 2021-11-24 (×9): qty 1

## 2021-11-24 MED ORDER — ATORVASTATIN CALCIUM 80 MG PO TABS
80.0000 mg | ORAL_TABLET | Freq: Every day | ORAL | Status: DC
  Administered 2021-11-24 – 2021-12-03 (×9): 80 mg via ORAL
  Filled 2021-11-24 (×9): qty 1

## 2021-11-24 MED ORDER — RENA-VITE PO TABS
1.0000 | ORAL_TABLET | Freq: Every day | ORAL | Status: DC
  Administered 2021-11-24 – 2021-12-03 (×10): 1 via ORAL
  Filled 2021-11-24 (×10): qty 1

## 2021-11-24 NOTE — Progress Notes (Signed)
Mobility Specialist Progress Note    11/24/21 1456  Mobility  Activity Ambulated with assistance in hallway  Level of Assistance +2 (takes two people) (chair follow)  Assistive Device Front wheel walker  Distance Ambulated (ft) 220 ft  Activity Response Tolerated well  Mobility Referral Yes  $Mobility charge 1 Mobility   Post-Mobility: 104 HR  Pt received in bed requesting to use BSC. Agreeable to ambulation afterwards. C/o fatigue. Min-modA to stand. Took x5 seated rest breaks throughout. Returned to bed with call bell in reach.   Hildred Alamin Mobility Specialist  Secure Chat Only

## 2021-11-24 NOTE — Progress Notes (Signed)
Patient is requesting that his med route be changed from tube to PO, per Ovid Curd in [place], he will make the change, patient swallows all food and drink well---patient doesn't want PEG tube used because he states it causes diarrhea for him--alsol refused all Peg tube bolus feedings, as well, for the same reason--Dr Vantright advised yesterday 10/14

## 2021-11-24 NOTE — Progress Notes (Signed)
Patient ID: [name], male   DOB: 08-05-1960, 61 y.o.   MRN: [id number] S: No complaints.  Tolerated HD well yesterday with UF of 2 liters O:BP 106/71 (BP Location: Left Arm)   Pulse 97   Temp 98.6 F (37 C) (Oral)   Resp 18   Ht 5\' 7"  (1.702 m)   Wt 81.6 kg   SpO2 100%   BMI 28.19 kg/m   Intake/Output Summary (Last 24 hours) at 11/24/2021 0906 Last data filed at 11/24/2021 0736 Gross per 24 hour  Intake 360 ml  Output 2000 ml  Net -1640 ml   Intake/Output: I/O last 3 completed shifts: In: 360 [P.O.:360] Out: 2000 [Other:2000]  Intake/Output this shift:  Total I/O In: 120 [P.O.:120] Out: -  Weight change:  Gen: NAD CVS: RRR Resp:CTA Abd: distended, +BS, soft, PEG in place, no drainage Ext: no edema  Recent Labs  Lab 11/19/21 0646 11/20/21 0009 11/21/21 0013 11/21/21 0940 11/22/21 0046 11/23/21 1012 11/24/21 0027  NA 135 134* 133* 134* 136 136 137  K 5.0 5.5* 6.8* 6.5* 4.5 4.7 3.9  CL 96* 95* 92* 93* 94* 93* 95*  CO2 24 23 23  21* 28 21* 27  GLUCOSE 114* 84 118* 90 84 86 82  BUN 99* 84* 111* 119* 46* 59* 23  CREATININE 6.65* 5.85* 7.29* 7.87* 4.41* 6.70* 3.80*  ALBUMIN 2.6* 2.8* 2.9* 3.0* 2.9* 3.1* 3.1*  CALCIUM 9.2 9.2 9.7 9.7 9.0 9.5 9.0  PHOS 4.1 3.9 5.6* 6.2* 4.0 5.6* 3.6   Liver Function Tests: Recent Labs  Lab 11/22/21 0046 11/23/21 1012 11/24/21 0027  ALBUMIN 2.9* 3.1* 3.1*   No results for input(s): "LIPASE", "AMYLASE" in the last 168 hours. No results for input(s): "AMMONIA" in the last 168 hours. CBC: Recent Labs  Lab 11/21/21 0940 11/22/21 0046 11/23/21 1012 11/23/21 1350 11/24/21 0027  WBC 7.4 8.7 9.2 9.0 9.0  HGB 9.5* 9.2* 10.6* 10.0* 10.3*  HCT 32.5* 31.6* 35.5* 32.3* 34.9*  MCV 95.9 95.2 93.9 92.8 93.8  PLT 442* 400 446* 416* 391   Cardiac Enzymes: No results for input(s): "CKTOTAL", "CKMB", "CKMBINDEX", "TROPONINI" in the last 168 hours. CBG: Recent Labs  Lab 11/23/21 1947 11/23/21 2032 11/23/21 2349 11/24/21 0340  11/24/21 0720  GLUCAP 118* 108* 94 83 101*    Iron Studies: No results for input(s): "IRON", "TIBC", "TRANSFERRIN", "FERRITIN" in the last 72 hours. Studies/Results: DG Swallowing Func-Speech Pathology  Result Date: 11/22/2021 Table formatting from the original result was not included. Objective Swallowing Evaluation: Type of Study: MBS-Modified Barium Swallow Study  Patient Details Name: [name] MRN: [id number] Date of Birth: 10-24-1960 Today's Date: 11/22/2021 Time: SLP Start Time (ACUTE ONLY): 1033 -SLP Stop Time (ACUTE ONLY): 1048 SLP Time Calculation (min) (ACUTE ONLY): 15 min Past Medical History: Past Medical History: Diagnosis Date  AKI (acute kidney injury) ([place])   pt unaware of this  Anginal pain ([place])   CAD (coronary artery disease)   a. 03/2015 NSTEMI: LHC with severe 3V CAD  (70% mid RCA, 95% OM1, 90% distal LCx, 90% OM3, 80% prox LAD and 90% ost D1) s/p DES to mLAD w/ small dissction Rx with DES, staged ost Ramus PCI/DES and dLCx s/p PCI/DES   Chest pain 12/24/2020  Diabetes mellitus type 2 in obese [facility])   Diverticulosis   Dyspnea   Dyspnea on exertion 03/16/2015  Dyspnea on exertion  Family history of adverse reaction to anesthesia   patient father- pt states after anesthesia his father "developed  dementia"  GERD (gastroesophageal reflux disease)   Hypercholesteremia   Hypertension associated with diabetes ([place]) 03/16/2015  hypertension  NSTEMI (non-ST elevated myocardial infarction) ([place]) 03/17/2015  Obesity   Stroke ([place]) 2022  pt states he had a "mini stroke" during cardiac catheterization  Tobacco abuse  Past Surgical History: Past Surgical History: Procedure Laterality Date  APPLICATION OF WOUND VAC N/A 1/66/0630  Procedure: APPLICATION OF WOUND VAC;  Surgeon: Dahlia Byes, MD;  Location: [place];  Service: Thoracic;  Laterality: N/A;  CANNULATION FOR ECMO (EXTRACORPOREAL MEMBRANE OXYGENATION) N/A 09/29/2021  Procedure: CANNULATION FOR ECMO (EXTRACORPOREAL MEMBRANE OXYGENATION);   Surgeon: Lajuana Matte, MD;  Location: [place];  Service: Open Heart Surgery;  Laterality: N/A;  CARDIAC CATHETERIZATION N/A 03/19/2015  Procedure: Left Heart Cath and Coronary Angiography;  Surgeon: Lorretta Harp, MD;  Location: [place] CV LAB;  Service: Cardiovascular;  Laterality: N/A;  CARDIAC CATHETERIZATION N/A 03/20/2015  Procedure: Coronary Stent Intervention;  Surgeon: Lorretta Harp, MD;  Location: [place] CV LAB;  Service: Cardiovascular;  Laterality: N/A;  CARDIAC CATHETERIZATION N/A 03/22/2015  Procedure: Coronary Stent Intervention;  Surgeon: Lorretta Harp, MD;  Location: [place] CV LAB;  Service: Cardiovascular;  Laterality: N/A;  CORONARY ARTERY BYPASS GRAFT N/A 09/12/2021  Procedure: CORONARY ARTERY BYPASS GRAFTING (CABG) X 5 USING LEFT INTERNAL MAMMARY ARTERY AND ENDOSCOPICALLY HARVESTED RIGHT GREATER SAPHENOUS VEIN.;  Surgeon: [name], MD;  Location: [place];  Service: Open Heart Surgery;  Laterality: N/A;  EXPLORATION POST OPERATIVE OPEN HEART N/A 09/30/2021  Procedure: EXPLORATION POST OPERATIVE OPEN HEART WASHOUT;  Surgeon: Lajuana Matte, MD;  Location: [place];  Service: Open Heart Surgery;  Laterality: N/A;  IR FLUORO GUIDE CV LINE LEFT  11/04/2021  IR GASTROSTOMY TUBE MOD SED  11/15/2021  IR GASTROSTOMY TUBE MOD SED  11/20/2021  IR US GUIDE VASC ACCESS LEFT  11/04/2021  LEFT HEART CATH AND CORONARY ANGIOGRAPHY N/A 12/25/2020  Procedure: LEFT HEART CATH AND CORONARY ANGIOGRAPHY;  Surgeon: Troy Sine, MD;  Location: [place] CV LAB;  Service: Cardiovascular;  Laterality: N/A;  LEFT HEART CATH AND CORONARY ANGIOGRAPHY N/A 12/26/2020  Procedure: LEFT HEART CATH AND CORONARY ANGIOGRAPHY;  Surgeon: Troy Sine, MD;  Location: [place] CV LAB;  Service: Cardiovascular;  Laterality: N/A;  MEDIASTINAL EXPLORATION N/A 10/03/2021  Procedure: MEDIASTINAL WASHOUT;  Surgeon: Dahlia Byes, MD;  Location: [place];  Service: Thoracic;  Laterality: N/A;  PUMP  STANDBY  MEDIASTINAL EXPLORATION N/A 10/12/2021  Procedure: MEDIASTINAL WASHOUT;  Surgeon: [name], MD;  Location: [place];  Service: Thoracic;  Laterality: N/A;  PERCUTANEOUS TRACHEOSTOMY N/A 10/17/2021  Procedure: PERCUTANEOUS TRACHEOSTOMY USING SHILEY FLEXIBLE 8 mm CUFFED [place].;  Surgeon: Candee Furbish, MD;  Location: [place];  Service: Pulmonary;  Laterality: N/A;  Percutaneous tracheostomy  PERICARDIAL WINDOW Right 09/28/2021  Procedure: PERICARDIAL WINDOW;  Surgeon: Lajuana Matte, MD;  Location: [place];  Service: Thoracic;  Laterality: Right;  Right VATS.  Lazy lateral.  double lumen ET tube  PLACEMENT OF IMPELLA LEFT VENTRICULAR ASSIST DEVICE N/A 10/07/2021  Procedure: INSERTION OF RIGHT AXILLARY IMPELLA, DECANNULATION OF ECMO, AND MEDIASTINAL WASHOUT;  Surgeon: [name], MD;  Location: [place];  Service: Open Heart Surgery;  Laterality: N/A;  Open right axillary with 8 mm Hemashield Platinum Vascular graft.  RADIOLOGY WITH ANESTHESIA N/A 11/20/2021  Procedure: G-Tube Placement;  Surgeon: Sandi Mariscal, MD;  Location: [place];  Service: Radiology;  Laterality: N/A;  REMOVAL OF IMPELLA LEFT VENTRICULAR  ASSIST DEVICE Right 10/17/2021  Procedure: REMOVAL OF IMPELLA LEFT VENTRICULAR ASSIST DEVICE;  Surgeon: [name], MD;  Location: [place];  Service: Open Heart Surgery;  Laterality: Right;  STERNAL CLOSURE N/A 10/12/2021  Procedure: STERNAL CLOSURE;  Surgeon: [name], MD;  [facility];  Service: Thoracic;  Laterality: N/A;  TEE WITHOUT CARDIOVERSION N/A 09/12/2021  Procedure: TRANSESOPHAGEAL ECHOCARDIOGRAM (TEE);  Surgeon: [name], MD;  Location: [place];  Service: Open Heart Surgery;  Laterality: N/A;  TEE WITHOUT CARDIOVERSION N/A 09/29/2021  Procedure: TRANSESOPHAGEAL ECHOCARDIOGRAM (TEE);  Surgeon: Lajuana Matte, MD;  Location: [place];  Service: Open Heart Surgery;  Laterality: N/A;  TEE WITHOUT CARDIOVERSION N/A 10/03/2021  Procedure: TRANSESOPHAGEAL ECHOCARDIOGRAM (TEE);   Surgeon: Dahlia Byes, MD;  Location: [place];  Service: Thoracic;  Laterality: N/A;  TEE WITHOUT CARDIOVERSION  10/07/2021  Procedure: TRANSESOPHAGEAL ECHOCARDIOGRAM (TEE);  Surgeon: [name], MD;  Location: [facility];  Service: Open Heart Surgery;;  TEE WITHOUT CARDIOVERSION N/A 10/12/2021  Procedure: TRANSESOPHAGEAL ECHOCARDIOGRAM (TEE);  Surgeon: [name], MD;  Location: [facility] OR;  Service: Thoracic;  Laterality: N/A;  TEE WITHOUT CARDIOVERSION N/A 10/17/2021  Procedure: TRANSESOPHAGEAL ECHOCARDIOGRAM (TEE);  Surgeon: [name], MD;  Location: [place];  Service: Open Heart Surgery;  Laterality: N/A;  TESTICLE SURGERY  1970  pt states testicle was ascended and had to be "pulled down"  TONSILLECTOMY    as a child [name], bradycardia and hypotension. Pt with bil pleural effusion and pericardial effusion s/p VATS with pericardial window 8/19. 8/20 cardiac tamponade with arrest,  intubated with bedside mediastinal exploration and ECMO via fem access. OR 8/21 for reexploration due to mediastinal hemorrhage. 8/28 OR for washout with ECMO decannulation and Impella placed. 8/29 brief PEA arrest with DCCV to NSR. 9/2 chest closure. 9/7 trach and Impella removed.9/11 VAC removed. Decannulated 9/26. PMHx: CAD s/p recent CABG x5 (admitted  8/3-11/23 with post op Afib), CKD stage II, DM, HTN, AKI, Afib and CVA  Subjective: alert but very uncomfortable in [place] chair, agreeable but only if study is kept brief  Recommendations for follow up therapy are one component of a multi-disciplinary discharge planning process, led by the attending physician.  Recommendations may be updated based on patient status, additional functional criteria and insurance authorization. Assessment / Plan / Recommendation   11/22/2021  11:00 AM Clinical Impressions Clinical Impression Pt shows great improvements on MBS today compared to oropharyngeal function on previous FEES. He has an oral dysphagia that includes  piecemeal swallowing and bilateral buccal pocketing with thin liquids, purees, and solids. He responded well to verbal cues to swallow again, clearing more of his oral residue but not completely clearing his oral cavity. Occasional premature spillage was observed with thin liquids, and he needs somewhat prolonged mastication time with a graham cracker. Pharyngeally there is still some weakness with reduced hyoid excursion and base of tongue retraction, but his pharyngeal residue is significantly reduced. There is a trace coating along his base of tongue and into his valleculae, but no significant build up of pharyngeal residue regardless of texture. Recommend that pt initiate Dys 2 diet and thin liquids. As pt can demonstrate improving oral cavity clearance, he will likely be able to advance his diet clinically at this point. Would try to optimize positioning during PO intake, which may be a little more challenging for him given wounds, but he has demonstrated his ability to sit upright in the chair  and the bed. SLP Visit Diagnosis Dysphagia, oropharyngeal phase (R13.12) Impact on safety and function Mild aspiration risk;Risk for inadequate nutrition/hydration     11/22/2021  11:00 AM Treatment Recommendations Treatment Recommendations Therapy as outlined in treatment plan below     11/22/2021  11:00 AM Prognosis Prognosis for Safe Diet Advancement Good Barriers to Reach Goals Cognitive deficits   11/22/2021  11:00 AM Diet Recommendations SLP Diet Recommendations Dysphagia 2 (Fine chop) solids;Thin liquid Liquid Administration via Cup;Straw Medication Administration Whole meds with liquid Compensations Minimize environmental distractions;Slow rate;Small sips/bites;Lingual sweep for clearance of pocketing Postural Changes Seated upright at 90 degrees     11/22/2021  11:00 AM Other Recommendations Oral Care Recommendations Oral care BID Other Recommendations Have oral suction available Follow Up Recommendations  Skilled nursing-short term rehab (<3 hours/day) Assistance recommended at discharge Frequent or constant Supervision/Assistance Functional Status Assessment Patient has had a recent decline in their functional status and demonstrates the ability to make significant improvements in function in a reasonable and predictable amount of time.   11/22/2021  11:00 AM Frequency and Duration  Speech Therapy Frequency (ACUTE ONLY) min 2x/week Treatment Duration 2 weeks     11/22/2021  11:00 AM Oral Phase Oral Phase Impaired Oral - Thin Cup Premature spillage;Piecemeal swallowing;Right pocketing in lateral sulci;Left pocketing in lateral sulci Oral - Thin Straw Premature spillage;Piecemeal swallowing;Right pocketing in lateral sulci;Left pocketing in lateral sulci Oral - Puree Piecemeal swallowing;Right pocketing in lateral sulci;Left pocketing in lateral sulci Oral - Regular Piecemeal swallowing;Right pocketing in lateral sulci;Left pocketing in lateral sulci;Impaired mastication Oral - Pill [facility]    11/22/2021  11:00 AM Pharyngeal Phase Pharyngeal Phase Impaired Pharyngeal- Nectar Teaspoon NT Pharyngeal- Thin Teaspoon NT Pharyngeal- Thin Cup Reduced anterior laryngeal mobility;Reduced tongue base retraction;Pharyngeal residue - valleculae Pharyngeal- Thin Straw Reduced anterior laryngeal mobility;Reduced tongue base retraction;Pharyngeal residue - valleculae Pharyngeal- Puree Reduced anterior laryngeal mobility;Reduced tongue base retraction;Pharyngeal residue - valleculae Pharyngeal- Regular Reduced anterior laryngeal mobility;Reduced tongue base retraction;Pharyngeal residue - valleculae Pharyngeal- Pill Reduced anterior laryngeal mobility;Reduced tongue base retraction    11/22/2021  11:00 AM Cervical Esophageal Phase  Cervical Esophageal Phase [facility] Osie Bond., M.A. CCC-SLP Acute Rehabilitation Services Office [phone] Secure chat preferred 11/22/2021, 11:48 AM                      sodium chloride   Intravenous Once    aspirin  81 mg Per Tube Daily   atorvastatin  80 mg Per Tube Daily   Chlorhexidine Gluconate Cloth  6 each Topical Daily   darbepoetin (ARANESP) injection - DIALYSIS  100 mcg Intravenous Q Tue-HD   feeding supplement (OSMOLITE 1.5 CAL)  356 mL Per Tube QID   feeding supplement (PROSource TF20)  60 mL Per Tube TID   fentaNYL  1 patch Transdermal Q72H   fiber supplement (BANATROL TF)  60 mL Per Tube QID   folic acid  1 mg Per Tube Daily   Gerhardt's butt cream   Topical Daily   heparin injection (subcutaneous)  5,000 Units Subcutaneous Q8H   insulin aspart  0-20 Units Subcutaneous Q4H   leptospermum manuka honey  1 Application Topical Daily   midodrine  10 mg Oral 3 times per day on Tue Thu Sat   multivitamin  1 tablet Per Tube QHS   nutrition supplement (JUVEN)  1 packet Per Tube BID BM   mouth rinse  15 mL Mouth Rinse 4 times per day   pantoprazole  40 mg Per Tube  BID   QUEtiapine  50 mg Per Tube QHS   sodium chloride flush  3 mL Intravenous Q12H   thiamine  100 mg Per Tube Daily    BMET    Component Value Date/Time   NA 137 11/24/2021 0027   NA 135 02/13/2021 0855   K 3.9 11/24/2021 0027   CL 95 (L) 11/24/2021 0027   CO2 27 11/24/2021 0027   GLUCOSE 82 11/24/2021 0027   BUN 23 11/24/2021 0027   BUN 9 02/13/2021 0855   CREATININE 3.80 (H) 11/24/2021 0027   CALCIUM 9.0 11/24/2021 0027   GFRNONAA 17 (L) 11/24/2021 0027   GFRAA >60 08/25/2019 2110   CBC    Component Value Date/Time   WBC 9.0 11/24/2021 0027   RBC 3.72 (L) 11/24/2021 0027   HGB 10.3 (L) 11/24/2021 0027   HGB 15.4 02/13/2021 0855   HCT 34.9 (L) 11/24/2021 0027   HCT 45.7 02/13/2021 0855   PLT 391 11/24/2021 0027   PLT 239 02/13/2021 0855   MCV 93.8 11/24/2021 0027   MCV 88 02/13/2021 0855   MCH 27.7 11/24/2021 0027   MCHC 29.5 (L) 11/24/2021 0027   RDW 17.1 (H) 11/24/2021 0027   RDW 11.3 (L) 02/13/2021 0855   LYMPHSABS 2.2 09/29/2021 1956   MONOABS 2.1 (H) 09/29/2021 1956   EOSABS 0.0 09/29/2021  1956   BASOSABS 0.0 09/29/2021 1956    Assessment/Plan:   ESRD - following ischemic ATN in setting of cardiogenic shock/cardiac arrest.  Started CRRT on 10/07/21 and transitioned to IHD on 10/31/21.  Currently on TTS schedule.  Outpatient dialysis arrangements pending [facility] financial clearance.  He does not eat before HD due to diarrhea and requests to be on first shift moving forward so he can eat when it is over. 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  H/o ECMO.  Currently on midodrine 20 mg tid. 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 Unstageable pressure ulcer of left buttock - wound care following. Hyperkalemia - unclear why his K is elevated since he had HD the day before.  Now improved.   Need to make sure his tube feeds are low K and phos.   May use Lokelma on Sunday and Monday prn.  K good at 3.9 today.  Donetta Potts, MD [facility]

## 2021-11-24 NOTE — Progress Notes (Signed)
      [address]       [place] [address]             [phone]       4 Days Post-Op Procedure(s) (LRB): G-Tube Placement (N/A)  Subjective:  No new complaints.  Biggest issues is sore on bottom..  Discussed with patient the importance of ambulating with staff, this will help his rehab and also gets him out of bed which can help with his sore bottom.Marland Kitchen He is agreeable to this.  Objective: Vital signs in last 24 hours: Temp:  [97.8 F (36.6 C)-98.7 F (37.1 C)] 98.6 F (37 C) (10/15 0717) Pulse Rate:  [86-116] 97 (10/15 0717) Cardiac Rhythm: Sinus tachycardia (10/15 0700) Resp:  [14-34] 18 (10/15 0717) BP: (91-126)/(51-93) 106/71 (10/15 0717) SpO2:  [97 %-100 %] 100 % (10/15 0717) Weight:  [81.6 kg-87.5 kg] 81.6 kg (10/15 0500)  Intake/Output from previous day: 10/14 0701 - 10/15 0700 In: 360 [P.O.:360] Out: 2000  Intake/Output this shift: Total I/O In: 120 [P.O.:120] Out: -   General appearance: alert, cooperative, and no distress Heart: regular rate and rhythm Lungs: clear to auscultation bilaterally Abdomen: soft, non-tender; bowel sounds normal; no masses,  no organomegaly Extremities: extremities normal, atraumatic, no cyanosis or edema Wound: wounds are clean and dry, no active signs of infection  Lab Results: Recent Labs    11/23/21 1350 11/24/21 0027  WBC 9.0 9.0  HGB 10.0* 10.3*  HCT 32.3* 34.9*  PLT 416* 391   BMET:  Recent Labs    11/23/21 1012 11/24/21 0027  NA 136 137  K 4.7 3.9  CL 93* 95*  CO2 21* 27  GLUCOSE 86 82  BUN 59* 23  CREATININE 6.70* 3.80*  CALCIUM 9.5 9.0    PT/INR: No results for input(s): "LABPROT", "INR" in the last 72 hours. ABG    Component Value Date/Time   PHART 7.301 (L) 10/22/2021 2354   HCO3 19.3 (L) 10/22/2021 2354   TCO2 20 (L) 10/22/2021 2354   ACIDBASEDEF 7.0 (H) 10/22/2021 2354   O2SAT 71.7 11/02/2021 0439   CBG (last 3)  Recent Labs    11/23/21 2349 11/24/21 0340  11/24/21 0720  GLUCAP 94 83 101*    Assessment/Plan: S/P Procedure(s) (LRB): G-Tube Placement (N/A)  CV- NSR, some bursts of Tachycardia at times- continue Midodrine for hypotension Pulm- off oxygen continue IS Renal- ESRD, for dialysis Tues, Nephrology following GI- patient currently refusing tube feedings due to persistent diarrhea, oral intake is poor.. continue SLP work to hopefully advance patient to full diet and be able to discontinue tube feedings as oral intake improves Wound care- continue per recommendations Deconditioning- educated patient on importance of participating and ambulating with staff.. He would like to go to CIR, however this will not be possible if he can not tolerate several hours of rehab per day.. Insurance also makes this an issues.. however he does have LOG... will consult CIR for evaluation once patient is ambulating more   LOS: 57 days    Ellwood Handler, PA-C 11/24/2021

## 2021-11-25 DIAGNOSIS — N186 End stage renal disease: Secondary | ICD-10-CM

## 2021-11-25 DIAGNOSIS — Z992 Dependence on renal dialysis: Secondary | ICD-10-CM

## 2021-11-25 LAB — GLUCOSE, CAPILLARY
Glucose-Capillary: 98 mg/dL (ref 70–99)
Glucose-Capillary: 102 mg/dL — ABNORMAL HIGH (ref 70–99)
Glucose-Capillary: 115 mg/dL — ABNORMAL HIGH (ref 70–99)
Glucose-Capillary: 121 mg/dL — ABNORMAL HIGH (ref 70–99)
Glucose-Capillary: 82 mg/dL (ref 70–99)
Glucose-Capillary: 119 mg/dL — ABNORMAL HIGH (ref 70–99)

## 2021-11-25 LAB — RENAL FUNCTION PANEL
Albumin: 2.9 g/dL — ABNORMAL LOW (ref 3.5–5.0)
Anion gap: 12 (ref 5–15)
BUN: 34 mg/dL — ABNORMAL HIGH (ref 8–23)
CO2: 26 mmol/L (ref 22–32)
Calcium: 8.9 mg/dL (ref 8.9–10.3)
Chloride: 95 mmol/L — ABNORMAL LOW (ref 98–111)
Creatinine, Ser: 5.65 mg/dL — ABNORMAL HIGH (ref 0.61–1.24)
GFR, Estimated: 11 mL/min — ABNORMAL LOW (ref >60)
Glucose, Bld: 93 mg/dL (ref 70–99)
Phosphorus: 4.9 mg/dL — ABNORMAL HIGH (ref 2.5–4.6)
Potassium: 3.6 mmol/L (ref 3.5–5.1)
Sodium: 133 mmol/L — ABNORMAL LOW (ref 135–145)

## 2021-11-25 LAB — CBC
HCT: 33.1 % — ABNORMAL LOW (ref 39.0–52.0)
Hemoglobin: 10.3 g/dL — ABNORMAL LOW (ref 13.0–17.0)
MCH: 28.3 pg (ref 26.0–34.0)
MCHC: 31.1 g/dL (ref 30.0–36.0)
MCV: 90.9 fL (ref 80.0–100.0)
Platelets: 376 10*3/uL (ref 150–400)
RBC: 3.64 MIL/uL — ABNORMAL LOW (ref 4.22–5.81)
RDW: 16.5 % — ABNORMAL HIGH (ref 11.5–15.5)
WBC: 9.3 10*3/uL (ref 4.0–10.5)
nRBC: 0 % (ref 0.0–0.2)

## 2021-11-25 LAB — MAGNESIUM: Magnesium: 1.7 mg/dL (ref 1.7–2.4)

## 2021-11-25 MED ORDER — OSMOLITE 1.5 CAL PO LIQD
356.0000 mL | Freq: Four times a day (QID) | ORAL | Status: DC
  Administered 2021-11-26: 356 mL

## 2021-11-25 NOTE — Progress Notes (Signed)
Nutrition Follow-up  DOCUMENTATION CODES:   Not applicable  INTERVENTION:   Bolus tube feeding regimen: - Goal of 356 ml (1.5 cartons) Osmolite 1.5 cal formula QID (total of 6 cartons daily)  First bolus: 119 ml (half carton)  Second bolus: 237 ml (1 full carton)  Third bolus: 356 ml (1.5 cartons)  Bolus tube feeding regimen at goal volume provides 2130 kcal, 89.4 grams of protein, and 1086 ml of H2O (meets 97% of minimum kcal needs and 66% of minimum protein needs).  - Recommend changing imodium from scheduled to PRN  - d/c Juven and PROSource TF as these may be worsening pt's diarrhea   - Continue thiamine, folic acid, and renal MVI supplementation  - Magic Cup TID with meals, each supplement provides 290 kcal and 9 grams of protein  - Encourage PO intake  NUTRITION DIAGNOSIS:   Inadequate oral intake related to acute illness as evidenced by NPO status.  Progressing, pt now on dysphagia 2 diet with thin liquids  GOAL:   Patient will meet greater than or equal to 90% of their needs  Progressing, being addressed via bolus TF  MONITOR:   PO intake, Supplement acceptance, Labs, Weight trends, TF tolerance, Skin, I & O's  REASON FOR ASSESSMENT:   Ventilator    ASSESSMENT:   61 yo male admitted with acute on chronic CHF with recent CABG, symptomatic bradycardia and hypotension. PMH includes DM, CAD s/p recent CABG x 5, CKD 3, CVA  08/19 - s/p pericardial window, evacuation of pericardial and pleural effusion 08/20 - intubated, bedside mediastinal re-exploration, cardiac tamponade, mediastinal clot; PEA arrest due to tamponade, VA ECMO cannulation 08/21 - return to OR for bleeding, mediastinal re-exploration, Cortrak placed (gastric) 08/23 - TEE with EF 25-30% 08/25 - Cortrak advanced to post-pyloric position 08/28 - OR: VA ECMO decannulation, Impella placed, mediastinal washout, CRRT initiated 08/29 - brief arrest 09/02 - OR: mediastinal washout, sternal  closure 09/05 - vomiting large volume, TF held, NG placed to LIS; watery, foul smelling diarrhea, KUB negative for ileus/obstruction, confirmed Cortrak is post-pyloric 09/07 - OR: Impella removed, trach placed 09/08 - CRRT held 09/09 - CRRT restarted, MD changed to Nepro 09/10 - TF off due to intolerance 09/11 - tolerating TF on Vital 1.5 formula 09/20 - CRRT discontinued 09/25 - trach de-cannulated 09/22 - failed FEES (severe aspiration risk, NPO) 09/29 - repeat FEES (severe aspiration risk, NPO 10/06 - s/p IR attempt at PEG placement, pt withdrew consent during procedure 10/11 - s/p PEG placement by IR under general anesthesia 10/12 - bolus TF started with Nepro 10/13 - MBS, diet advanced to dysphagia 2 with thin liquids, TF changed to Osmolite 1.5  Per WOC note, plan to start hydrotherapy to L buttock and coccyx area.  Last iHD was on 10/14 with 2000 ml net UF. Post-HD weight was 85.5 kg.  Discussed pt with RN and Palliative Medicine NP prior to speaking with pt. Pt "refusing" bolus tube feeds over the weekend and so far today. Refusal of bolus feeds is documented in MAR.  Spoke with pt at bedside with Palliative Medicine NP present. Pt reports that he has not been getting his tube feeds because 1) he does not think that he needs them anymore since he can eat and 2) he thinks that the tube feeds worsen his diarrhea. Explained to pt that he does still need tube feeds due to inadequate oral intake and increased nutrient needs related to wound healing. Pt reports eating "most" of his  breakfast. Discussed with RN who did not visualize breakfast meal tray.  Initially after PEG placement, pt received Nepro tube feeding formula due to elevated potassium and phosphorus labs. Pt with increased diarrhea on Nepro formula, likely due to high fat content and high osmolality. Pt began refusing bolus feeds. RD spoke with pt on Friday, 10/13. At that time, pt was willing to try a different/gentler tube  feeding formula so regimen was changed to Osmolite 1.5; however, pt did not accept any Osmolite 1.5 tube feeding boluses per [facility] documentation.  Discussed with pt again the importance of continuing tube feeds to promote wound healing and help pt regain his strength and build back muscle. Pt expresses understanding and is willing to try bolus feeds with Osmolite 1.5. Will adjust tube feeding order to start with smaller volume bolus (half carton) and slowly increase to goal volume (1.5 cartons).  Pt reports that he is refusing the imodium now due to "not needing it." Pt reports that his diarrhea has been improved but that he is having gas. Recommend changing imodium from scheduled to PRN.  Pt also mentions "the pink stuff" making his diarrhea worse. Suspect pt may be referring to [place] for wound healing. Will d/c both Juven and PROSource TF at this time and assess pt's tolerance of Osmolite bolus tube feeds.  Question if pt would benefit from probiotic.  Pt willing to consume some of Magic Cup supplement with RD in room. RD provided feeding assistance, and pt consumed ~50% (145 kcal, 4.5 grams of protein). RD to ensure that Magic Cups are sent on all of pt's meal trays.  Admit weight: 101 kg Current weight: 86.7 kg  Meal Completion: 0-30%  Medications reviewed and include: aranesp, folic acid, SSI q 4 hours, imodium 4 mg TID, rena-vit, Juven BID, protonix, thiamine  Labs reviewed: sodium 133, phosphorus 4.9 CBG's: 81-143 x 24 hours  Diet Order:   Diet Order             DIET DYS 2 Room service appropriate? Yes with Assist; Fluid consistency: Thin  Diet effective now                   EDUCATION NEEDS:   Education needs have been addressed  Skin:  Skin Assessment: Skin Integrity Issues: Stage II: R upper buttock, L ischium Unstageable: L upper  buttock, gluteal cleft/distal sacrum Incisions: chest (closed, wound VAC removed) Other: sheer/friction to right scapula  region, non-pressure wound to R arm  Last BM:  11/25/21 small  Height:   Ht Readings from Last 1 Encounters:  10/23/21 5\' 7"  (1.702 m)    Weight:   Wt Readings from Last 1 Encounters:  11/25/21 86.7 kg    BMI:  Body mass index is 29.95 kg/m.  Estimated Nutritional Needs:   Kcal:  2200-2400 kcals  Protein:  135-165 grams  Fluid:  1.8 L    Gustavus Bryant, MS, RD, LDN Inpatient Clinical Dietitian Please see [facility] for contact information.

## 2021-11-25 NOTE — TOC Progression Note (Signed)
Transition of Care [facility]) - Progression Note    Patient Details  Name: [name] MRN: [id number] Date of Birth: [birth year]  Transition of Care [facility]) CM/SW [place], [place] Phone Number: 11/25/2021, 2:59 PM  Clinical Narrative:     Pt has no bed offers. [facility] is "considering" in hub. CSW contacted [place] liaison to discuss potential for bed offer; awaiting response.   Expected Discharge Plan: Skilled Nursing Facility Barriers to Discharge: Continued Medical Work up, No SNF bed, SNF Pending Medicaid  Expected Discharge Plan and Services Expected Discharge Plan: [place]                                               Social Determinants of Health (SDOH) Interventions    Readmission Risk Interventions    09/20/2021   11:57 AM  Readmission Risk Prevention Plan  Post Dischage Appt Complete  Medication Screening Complete  Transportation Screening Complete

## 2021-11-25 NOTE — Progress Notes (Signed)
Physical Therapy Treatment Patient Details Name: [name] MRN: [id number] DOB: 06/29/1960 Today's Date: 11/25/2021   History of Present Illness Pt is a 61 y.o. male admitted 09/28/21 with SOB, bradycardia, hypotension. Workup for bilateral pleural effusion, pericardial effusion s/p VATS with pericardial window 8/19. Cardiac arrest with tamponade 8/20, intubated with bedside mediastinal exploration, ECMO via fem access. Pt with mediastinal hemorrhage s/p reexploration 8/21. To OR 8/28 for washout, ECMO decannulation, Impella placement. CRRT initiated 8/28. Brief PEA arrest 8/29 with DCCV to NSR.  S/p chest closure 9/2. S/p trach and impella removed 9/7. S/p wound vac removal 9/11. Course complicated by bouts of hypotension and bradycardia. Off CRRT 9/19. Trach decannulated 9/25. Tunned HD cath placement 9/25; iHD initiated. PMH includes CAD (s/p recent CABG 09/12/21), CKD2, DM, HTN, AKI, afib, CVA.    PT Comments    Pt received sidelying and agreeable to session with continued progress towards acute goals. Session focused on gait training for increased activity tolerance and improved balance. Pt demonstrating gait with RW and min guard throughout for safety, however pt continues to be limited by fatigue needing multiple seated recovery breaks throughout. Pt needing cues for gait mechanics as pt with very narrow BOS, however able to widen with cueing but with poor carryover between gait bouts, emphasis for pt to control each step as pt with tendency for quick uncontrolled steps increasing risk for falls. Pt VSS on RA throughout. Pt continues to benefit from skilled PT services to progress toward functional mobility goals.    Recommendations for follow up therapy are one component of a multi-disciplinary discharge planning process, led by the attending physician.  Recommendations may be updated based on patient status, additional functional criteria and insurance authorization.  Follow Up  Recommendations  Skilled nursing-short term rehab (<3 hours/day) Can patient physically be transported by private vehicle: No   Assistance Recommended at Discharge Frequent or constant Supervision/Assistance  Patient can return home with the following A lot of help with bathing/dressing/bathroom;A lot of help with walking and/or transfers;Direct supervision/assist for financial management;Help with stairs or ramp for entrance;Direct supervision/assist for medications management;Assistance with cooking/housework;Assist for transportation   Equipment Recommendations  Wheelchair (measurements PT);Wheelchair cushion (measurements PT);Hospital bed    Recommendations for Other Services       Precautions / Restrictions Precautions Precautions: Sternal;Fall;Other (comment) Precaution Booklet Issued: No Precaution Comments: painful sacral/buttocks wound, sternal wound Restrictions Weight Bearing Restrictions: No Other Position/Activity Restrictions: initial CABG 09/12/21 with closure 9/2     Mobility  Bed Mobility Overal bed mobility: Needs Assistance Bed Mobility: Sit to Sidelying, Sidelying to Sit Rolling: Min guard Sidelying to sit: Min assist     Sit to sidelying: Mod assist General bed mobility comments: light min assist to elevate trunk, mod assist to return BLE to bed    Transfers Overall transfer level: Needs assistance Equipment used: Rolling walker (2 wheels) Transfers: Sit to/from Stand, Bed to chair/wheelchair/BSC Sit to Stand: Mod assist, Min assist   Step pivot transfers: Min assist       General transfer comment: patient required mod assist to stand from EOB to RW decreaseing to min assist with practice, STS x 5 throughout session    Ambulation/Gait Ambulation/Gait assistance: Min guard Gait Distance (Feet): 300 Feet (x4 seated recovery breaks) Assistive device: Rolling walker (2 wheels) Gait Pattern/deviations: Step-through pattern, Decreased stride length,  Narrow base of support Gait velocity: decr     General Gait Details: min guard for safety, chair follow as pt contineus to fatigue  quickly needing to sit abruptly, decreased BOS needing cues throughout to widden, reliance on UE support, cues for more controlled/intentional steps with good return   Stairs             Wheelchair Mobility    Modified Rankin (Stroke Patients Only)       Balance Overall balance assessment: Needs assistance Sitting-balance support: Feet supported Sitting balance-Leahy Scale: Fair Sitting balance - Comments: seated on EOB upon entry   Standing balance support: Bilateral upper extremity supported Standing balance-Leahy Scale: Poor Standing balance comment: reliant on RW for support                            Cognition Arousal/Alertness: Awake/alert Behavior During Therapy: Flat affect Overall Cognitive Status: Impaired/Different from baseline Area of Impairment: Attention, Memory, Following commands, Safety/judgement, Awareness, Problem solving                         Safety/Judgement: Decreased awareness of safety, Decreased awareness of deficits   Problem Solving: Requires verbal cues, Slow processing General Comments: able to follow commands and express needs        Exercises      General Comments        Pertinent Vitals/Pain Pain Assessment Pain Assessment: Faces Faces Pain Scale: Hurts little more Pain Location: perineal wounds Pain Descriptors / Indicators: Sore Pain Intervention(s): Monitored during session, Limited activity within patient's tolerance    Home Living                          Prior Function            PT Goals (current goals can now be found in the care plan section) Acute Rehab PT Goals PT Goal Formulation: With patient Time For Goal Achievement: 11/29/21    Frequency    Min 3X/week      PT Plan Current plan remains appropriate    Co-evaluation               AM-PAC PT "6 Clicks" Mobility   Outcome Measure  Help needed turning from your back to your side while in a flat bed without using bedrails?: A Lot Help needed moving from lying on your back to sitting on the side of a flat bed without using bedrails?: A Lot Help needed moving to and from a bed to a chair (including a wheelchair)?: A Little Help needed standing up from a chair using your arms (e.g., wheelchair or bedside chair)?: A Little Help needed to walk in hospital room?: A Lot (chair follow) Help needed climbing 3-5 steps with a railing? : Total 6 Click Score: 13    End of Session   Activity Tolerance: Patient tolerated treatment well Patient left: in bed;with call bell/phone within reach Nurse Communication: Mobility status PT Visit Diagnosis: Other abnormalities of gait and mobility (R26.89);Difficulty in walking, not elsewhere classified (R26.2);Muscle weakness (generalized) (M62.81)     Time: 6962-9528 PT Time Calculation (min) (ACUTE ONLY): 21 min  Charges:  $Gait Training: 8-22 mins                     Damiano Stamper R. PTA Acute Rehabilitation Services Office: [place] 11/25/2021, 11:31 AM

## 2021-11-25 NOTE — Progress Notes (Addendum)
Patient ID: [name], male   DOB: 1960/08/31, 61 y.o.   MRN: [id number]   Advanced Heart Failure Rounding Note   Subjective:    - 8/19 Pericardial window - 8/20 Cardiac arrest with tamponade -> Emergent bedside washout - 09/29/21 VA Cannulation - 09/30/21 Return to OR for mediastinal hemorrhage - 10/01/21 Developed AF -> amio - 10/02/21 TEE EF 25-30%  - 10/04/21 OR for washout. C/b continued bleeding overnight - 10/07/21 Placement of Impella 5.5 with washout, VA ECMO decannulation. Hypotensive with development of severe RV dysfunction after ECMO off and pressors titrated up. Multiple units of blood products. - 10/08/21 Brief PEA arrest. AFL with RVR >> S/p DCCV to SR, back in AFL shortly after - 8/31 Give 1UPRBCs  - 9/2 OR for chest closure - 9/3 Hypotensive overnight w/ SBPs in 80s. Febrile, mTemp 100.8. CRRT paused. VP increased to 0.04.  - 9/4 s/p bronchoscopy w/ BAL by PCCM, Cx NGTD  - 9/5 vomiting w/ large volume NGT output + watery/foul diarrhea, Tube feeds held. No signs of ileus on KUB. C-diff negative   - 9/7 OR for Impella Extraction and percutaneous tracheostomy. 2 u RBCs. - 9/8 CVVH stopped and line removed for holiday - 9/9 CVVH restarted.  -9/13 Worsening leukocytosis and pressor requirements. Purulent drainage from arterial line. Started Daptomycin, Micafungin and Meropenem. - 9/17 Daptomycin + Micafungin stopped. Remains on Meropenum  - 9/21 transitioned to iHD  - 9/25 Tunneled HD cath placed. Trach decannulation - 9/26 Hgb 6.1>>transfused 2uRBC  - 10/11 gtube placed  Underwent G-tube placement 10/11. Refusing G-tube bolus feedings 2/2 diarrhea. Now tolerating PO.   SBP 110s-120s. Midodrine weaned for only HD days.   Feeling better today. No complaints this am. Encouraged good nutritional intake and ambulation. Denies CP and SOB.  Objective:     Vital Signs:   Temp:  [97.7 F (36.5 C)-98.5 F (36.9 C)] 98.5 F (36.9 C) (10/16 0411) Pulse Rate:  [92-95] 95  (10/15 2046) Resp:  [18-24] 22 (10/16 0411) BP: (88-125)/(64-77) 125/77 (10/16 0411) SpO2:  [96 %-98 %] 98 % (10/16 0411) Weight:  [86.7 kg] 86.7 kg (10/16 0411) Last BM Date : 11/22/21  Weight change: Filed Weights   11/23/21 1843 11/24/21 0500 11/25/21 0411  Weight: 85.5 kg 81.6 kg 86.7 kg    Intake/Output:   Intake/Output Summary (Last 24 hours) at 11/25/2021 0744 Last data filed at 11/25/2021 0500 Gross per 24 hour  Intake 1256.63 ml  Output --  Net 1256.63 ml   General:  deconditioned appearing.  No respiratory difficulty HEENT: normal Neck: supple. JVD ~7 cm. Carotids 2+ bilat; no bruits. No lymphadenopathy or thyromegaly appreciated. Cor: PMI nondisplaced. Regular rate & rhythm. No rubs, gallops or murmurs. Lungs: clear Abdomen: soft, nontender, nondistended. No hepatosplenomegaly. No bruits or masses. Good bowel sounds. Extremities: no cyanosis, clubbing, rash, edema  Neuro: alert & oriented x 3, cranial nerves grossly intact. moves all 4 extremities w/o difficulty. Affect pleasant.   Telemetry: NSR 90s  Labs: Basic Metabolic Panel: Recent Labs  Lab 11/20/21 0009 11/21/21 0013 11/21/21 0940 11/22/21 0046 11/23/21 1012 11/24/21 0027  NA 134* 133* 134* 136 136 137  K 5.5* 6.8* 6.5* 4.5 4.7 3.9  CL 95* 92* 93* 94* 93* 95*  CO2 23 23 21* 28 21* 27  GLUCOSE 84 118* 90 84 86 82  BUN 84* 111* 119* 46* 59* 23  CREATININE 5.85* 7.29* 7.87* 4.41* 6.70* 3.80*  CALCIUM 9.2 9.7 9.7 9.0 9.5 9.0  MG  1.8 2.1  --  1.8 1.9 1.7  PHOS 3.9 5.6* 6.2* 4.0 5.6* 3.6    Liver Function Tests: Recent Labs  Lab 11/21/21 0013 11/21/21 0940 11/22/21 0046 11/23/21 1012 11/24/21 0027  ALBUMIN 2.9* 3.0* 2.9* 3.1* 3.1*   No results for input(s): "LIPASE", "AMYLASE" in the last 168 hours. No results for input(s): "AMMONIA" in the last 168 hours.  CBC: Recent Labs  Lab 11/22/21 0046 11/23/21 1012 11/23/21 1350 11/24/21 0027 11/25/21 0643  WBC 8.7 9.2 9.0 9.0 9.3  HGB  9.2* 10.6* 10.0* 10.3* 10.3*  HCT 31.6* 35.5* 32.3* 34.9* 33.1*  MCV 95.2 93.9 92.8 93.8 90.9  PLT 400 446* 416* 391 376    Cardiac Enzymes: No results for input(s): "CKTOTAL", "CKMB", "CKMBINDEX", "TROPONINI" in the last 168 hours.   BNP: BNP (last 3 results) Recent Labs    12/24/20 0651 09/28/21 0405  BNP 412.0* 826.6*    ProBNP (last 3 results) No results for input(s): "PROBNP" in the last 8760 hours.    Other results:  Imaging: No results found.   Medications:     Scheduled Medications:  sodium chloride   Intravenous Once   aspirin  81 mg Oral Daily   atorvastatin  80 mg Oral Daily   Chlorhexidine Gluconate Cloth  6 each Topical Daily   darbepoetin (ARANESP) injection - DIALYSIS  100 mcg Intravenous Q Tue-HD   feeding supplement (OSMOLITE 1.5 CAL)  356 mL Per Tube QID   feeding supplement (PROSource TF20)  60 mL Per Tube TID   fentaNYL  1 patch Transdermal N62X   folic acid  1 mg Oral Daily   Gerhardt's butt cream   Topical Daily   heparin injection (subcutaneous)  5,000 Units Subcutaneous Q8H   insulin aspart  0-20 Units Subcutaneous Q4H   leptospermum manuka honey  1 Application Topical Daily   loperamide HCl  4 mg Oral TID   midodrine  10 mg Oral 3 times per day on Tue Thu Sat   multivitamin  1 tablet Oral QHS   nutrition supplement (JUVEN)  1 packet Oral BID BM   mouth rinse  15 mL Mouth Rinse 4 times per day   pantoprazole  40 mg Oral BID   QUEtiapine  50 mg Oral QHS   sodium chloride flush  3 mL Intravenous Q12H   thiamine  100 mg Oral Daily    Infusions:  sodium chloride Stopped (11/09/21 0715)   sodium chloride Stopped (11/04/21 1021)   sodium chloride 50 mL/hr at 11/25/21 0400   albumin human 12.5 g (11/01/21 2248)    PRN Medications: sodium chloride, acetaminophen (TYLENOL) oral liquid 160 mg/5 mL, albumin human, camphor-menthol, clonazePAM, dextrose, diphenhydrAMINE, HYDROmorphone (DILAUDID) injection, iohexol, ipratropium-albuterol,  metoprolol tartrate, ondansetron (ZOFRAN) IV, mouth rinse, [EXPIRED] oxyCODONE **FOLLOWED BY** oxyCODONE, polyvinyl alcohol, sodium chloride flush, white petrolatum   Assessment/Plan:    1. Shock - mixed cardiogenic/hemorrhagic -> VA ECMO -> decannulated on 8/28 to Impella 5.5 - Echo 08/28: Underfilled LV, EF 55-60% with severe LVH and near normal RV systsystolic function.  - Impella extracted 9/7 - Bedside echo 09/13 - LV function preserved, RV mildly reduced. Small clot in posterior pericardium but no hemodynamic effect  - Completed total of 14 days of Micafungin (ended 9/27) - Off pressors.  - Previously on midodrine 20 TID >>> now down to 10 TID on HD days. BP stable this am  2. Cardiac arrest (PEA/bradycardic) - 8/19 and 8/28 in setting of tamponade - resolved  3.  Cardiac tamponade with emergent bedside sternotomy  - Diffuse epicardial bleeding with post-op Dresslers - return to OR 8/21 and 8/24 for washouts.  - Washout 8/28 in OR. Multiple units of blood products in OR.   - Chest closed on 9/2 - Hgb stable 10.3 today   4. Acute hypoxemic respiratory failure - Off ECMO.  - Perc Trach placed 9/7  - s/p bronchoscopy w/ BAL 9/4.  - tracheal aspirate 09/13 growing few GPC and rare yeast  - Started back on IV abx as above d/t concern for sepsis.  - Trach downsized - Trach decannulated 9/25  - Resolved. On room air.   5. AKI due to ATN - CRRT started 08/28.  - Remains anuric - Transitioned to [facility] 9/21. Nephrology following  - s/p [facility] cath by IR   - tolerating iHD (TTS schedule).   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 Now off colchicine   7. 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 Continue ASA 81 mg daily  8. DM2 - continue SSI   9. PAF/AFL - Tolerates poorly.  - Recurrent AFL. S/p DCCV to SR 08/29.  - Off amio d/t bradycardia - Not on AC for now d/t bleeding, DVT dose heparin.  - In NSR today  10. ID - Completed course of abx with  open chest.  - Prior possible aspiration with vomiting treated with abx.  - No s/s of ongoing infection  11. FEN - TFs ongoing, issues with BMs. Refusing bolus feeds now 2/2 diarrhea - Underwent gastrostomy tube placement 10/11 - Modified Barium Swallow 10/13: now tolerating PO meals/intake - SLP following   12. Hyperkalemia - CMET pending today, 3.9 yesterday - has received Lokelma in the past for ^^K  13. Unstageable Pressure Ulcer, Buttock  - WOC following.  - Improving, still very painful  14. Fall - management per primary team  15. Diarrhea - mgmt per primary - Imodium, some improvement  Outpatient HD chair approved.  PT/OT following.Needs SNF for rehab at discharge but has no insurance. HF TOC CM assisting with disposition.  Earnie Larsson, AGACNP-BC  7:44 AM  Patient seen and examined with the above-signed Advanced Practice Provider and/or Housestaff. I personally reviewed laboratory data, imaging studies and relevant notes. I independently examined the patient and formulated the important aspects of the plan. I have edited the note to reflect any of my changes or salient points. I have personally discussed the plan with the patient and/or family.   Patient seen and examined with the above-signed Advanced Practice Provider and/or Housestaff. I personally reviewed laboratory data, imaging studies and relevant notes. I independently examined the patient and formulated the important aspects of the plan. I have edited the note to reflect any of my changes or salient points. I have personally discussed the plan with the patient and/or family.  BP stable with midodrine on HD days only. Rhythm stable.   Trouble tolerating TFs. Doesn't like D2 diet.   General:  Sitting up  No resp difficulty HEENT: normal Neck: supple. JVP 7 . Carotids 2+ bilat; no bruits. No lymphadenopathy or thryomegaly appreciated. Cor: PMI nondisplaced. Regular rate & rhythm. No rubs, gallops or  murmurs. Lungs: clear Abdomen: soft, nontender, nondistended. No hepatosplenomegaly. No bruits or masses. Good bowel sounds. Extremities: no cyanosis, clubbing, rash, edema Neuro: alert & orientedx3, cranial nerves grossly intact. moves all 4 extremities w/o difficulty. Affect pleasant  Stable from CV standpoint. Main issues now are getting enough Nutrition and  continue rehab.   Glori Bickers, MD  8:13 AM

## 2021-11-25 NOTE — Progress Notes (Signed)
5 Days Post-Op Procedure(s) (LRB): G-Tube Placement (N/A) Subjective:  Not eating much po because he says the food tastes terrible. I can't tell if he has been getting tube feeds or not. He has IVF going at 50/hr for some reason.   Objective: Vital signs in last 24 hours: Temp:  [97.7 F (36.5 C)-98.5 F (36.9 C)] 98.2 F (36.8 C) (10/16 0752) Pulse Rate:  [89-95] 89 (10/16 0752) Cardiac Rhythm: Normal sinus rhythm (10/16 0739) Resp:  [18-24] 23 (10/16 0752) BP: (88-125)/(64-81) 122/81 (10/16 0752) SpO2:  [96 %-98 %] 98 % (10/16 0411) Weight:  [86.7 kg] 86.7 kg (10/16 0411)  Hemodynamic parameters for last 24 hours:    Intake/Output from previous day: 10/15 0701 - 10/16 0700 In: 1376.6 [P.O.:836; I.V.:540.6] Out: -  Intake/Output this shift: No intake/output data recorded.  General appearance: alert and cooperative Neurologic: intact Heart: regular rate and rhythm Lungs: clear to auscultation bilaterally Abdomen: soft, non-tender; bowel sounds normal Extremities: extremities normal, atraumatic, no cyanosis or edema Wound: chest and sacral dressings intact.  Lab Results: Recent Labs    11/24/21 0027 11/25/21 0643  WBC 9.0 9.3  HGB 10.3* 10.3*  HCT 34.9* 33.1*  PLT 391 376   BMET:  Recent Labs    11/23/21 1012 11/24/21 0027  NA 136 137  K 4.7 3.9  CL 93* 95*  CO2 21* 27  GLUCOSE 86 82  BUN 59* 23  CREATININE 6.70* 3.80*  CALCIUM 9.5 9.0    PT/INR: No results for input(s): "LABPROT", "INR" in the last 72 hours. ABG    Component Value Date/Time   PHART 7.301 (L) 10/22/2021 2354   HCO3 19.3 (L) 10/22/2021 2354   TCO2 20 (L) 10/22/2021 2354   ACIDBASEDEF 7.0 (H) 10/22/2021 2354   O2SAT 71.7 11/02/2021 0439   CBG (last 3)  Recent Labs    11/24/21 2219 11/25/21 0416 11/25/21 0755  GLUCAP 143* 98 102*    Assessment/Plan: S/P Procedure(s) (LRB): G-Tube Placement (N/A)  He has been hemodynamically stable in sinus rhythm with midodrine on  dialysis days.  DC IVF. He is able to take po fluids.  Encouraged to eat po diet which will help avoid loose stools. He either has to take adequate po or tube feeds to maintain nutrition, wound healing and physical activity. Hopefully he can progress to more regular diet with time. I agree that his dysphagia 2 diet is less than appetizing.  Tolerating HD well.  Rehab and nutrition are his main goals at this time.    LOS: 58 days    [name] 11/25/2021

## 2021-11-25 NOTE — Progress Notes (Signed)
Speech Language Pathology Treatment: Dysphagia  Patient Details Name: [name] MRN: [id number] DOB: [birth year] Today's Date: 11/25/2021 Time: 4098-1191 SLP Time Calculation (min) (ACUTE ONLY): 18 min  Assessment / Plan / Recommendation Clinical Impression  Pt was seen for f/u after MBS on 10/13, including education about results and recommendations. He said that his doctor talked to him this morning about the importance of PO intake, and he is trying to comply, but that it is very hard when his food is so unappealing. SLP offered trials of regular solids and thin liquids, with the goal specifically stated for him to more fully clear his oral cavity in between bites. He did this very well across all trials once assisted to the EOB. Recommend advancing to Dys 3 (mechanical soft) solids and thin liquids. He may be appropriate to advance to regular solids quickly, but would like to ensure that his mentation, positioning, and oral clearance remain as appropriate as they were today as he has had fluctuating status this admission.    HPI [name], bradycardia and hypotension. Pt with bil pleural effusion and pericardial effusion s/p VATS with pericardial window 8/19. 8/20 cardiac tamponade with arrest,  intubated with bedside mediastinal exploration and ECMO via fem access. OR 8/21 for reexploration due to mediastinal hemorrhage. 8/28 OR for washout with ECMO decannulation and Impella placed. 8/29 brief PEA arrest with DCCV to NSR. 9/2 chest closure. 9/7 trach and Impella removed.9/11 VAC removed. Decannulated 9/26. PMHx: CAD s/p recent CABG x5 (admitted  8/3-11/23 with post op Afib), CKD stage II, DM, HTN, AKI, Afib and CVA      SLP Plan  Continue with current plan of care      Recommendations for follow up therapy are one component of a multi-disciplinary discharge planning process, led by the attending physician.  Recommendations may be updated based on patient  status, additional functional criteria and insurance authorization.    Recommendations  Diet recommendations: Dysphagia 3 (mechanical soft);Thin liquid Liquids provided via: Cup;Straw Medication Administration: Whole meds with liquid Supervision: Intermittent supervision to cue for compensatory strategies (set up assist before each meal) Compensations: Minimize environmental distractions;Slow rate;Small sips/bites;Lingual sweep for clearance of pocketing Postural Changes and/or Swallow Maneuvers: Seated upright 90 degrees                Oral Care Recommendations: Oral care BID Follow Up Recommendations: Skilled nursing-short term rehab (<3 hours/day) Assistance recommended at discharge: Frequent or constant Supervision/Assistance SLP Visit Diagnosis: Dysphagia, oropharyngeal phase (R13.12) Plan: Continue with current plan of care           Osie Bond., M.A. [place] Office [phone]  Secure chat preferred   11/25/2021, 3:58 PM

## 2021-11-25 NOTE — Progress Notes (Signed)
Patient ID: [name], male   DOB: 06/28/1960, 61 y.o.   MRN: [id number] S: No complaints.  No acute events overnight. Remains anuric O:BP 125/77 (BP Location: Left Arm)   Pulse 95   Temp 98.5 F (36.9 C) (Oral)   Resp (!) 22   Ht 5\' 7"  (1.702 m)   Wt 86.7 kg   SpO2 98%   BMI 29.95 kg/m   Intake/Output Summary (Last 24 hours) at 11/25/2021 0750 Last data filed at 11/25/2021 0500 Gross per 24 hour  Intake 1256.63 ml  Output --  Net 1256.63 ml   Intake/Output: I/O last 3 completed shifts: In: 1376.6 [P.O.:836; I.V.:540.6] Out: -   Intake/Output this shift:  No intake/output data recorded. Weight change: -0.772 kg Gen: NAD CVS: RRR Resp:CTA Abd: distended, +BS, soft, PEG in place, no drainage Ext: no edema Dialysis access: [facility]  Recent Labs  Lab 11/19/21 0646 11/20/21 0009 11/21/21 0013 11/21/21 0940 11/22/21 0046 11/23/21 1012 11/24/21 0027  NA 135 134* 133* 134* 136 136 137  K 5.0 5.5* 6.8* 6.5* 4.5 4.7 3.9  CL 96* 95* 92* 93* 94* 93* 95*  CO2 24 23 23  21* 28 21* 27  GLUCOSE 114* 84 118* 90 84 86 82  BUN 99* 84* 111* 119* 46* 59* 23  CREATININE 6.65* 5.85* 7.29* 7.87* 4.41* 6.70* 3.80*  ALBUMIN 2.6* 2.8* 2.9* 3.0* 2.9* 3.1* 3.1*  CALCIUM 9.2 9.2 9.7 9.7 9.0 9.5 9.0  PHOS 4.1 3.9 5.6* 6.2* 4.0 5.6* 3.6   Liver Function Tests: Recent Labs  Lab 11/22/21 0046 11/23/21 1012 11/24/21 0027  ALBUMIN 2.9* 3.1* 3.1*   No results for input(s): "LIPASE", "AMYLASE" in the last 168 hours. No results for input(s): "AMMONIA" in the last 168 hours. CBC: Recent Labs  Lab 11/22/21 0046 11/23/21 1012 11/23/21 1350 11/24/21 0027 11/25/21 0643  WBC 8.7 9.2 9.0 9.0 9.3  HGB 9.2* 10.6* 10.0* 10.3* 10.3*  HCT 31.6* 35.5* 32.3* 34.9* 33.1*  MCV 95.2 93.9 92.8 93.8 90.9  PLT 400 446* 416* 391 376   Cardiac Enzymes: No results for input(s): "CKTOTAL", "CKMB", "CKMBINDEX", "TROPONINI" in the last 168 hours. CBG: Recent Labs  Lab 11/24/21 1112 11/24/21 1528  11/24/21 1925 11/24/21 2219 11/25/21 0416  GLUCAP 106* 104* 91 143* 98    Iron Studies: No results for input(s): "IRON", "TIBC", "TRANSFERRIN", "FERRITIN" in the last 72 hours. Studies/Results: No results found.  sodium chloride   Intravenous Once   aspirin  81 mg Oral Daily   atorvastatin  80 mg Oral Daily   Chlorhexidine Gluconate Cloth  6 each Topical Daily   darbepoetin (ARANESP) injection - DIALYSIS  100 mcg Intravenous Q Tue-HD   feeding supplement (OSMOLITE 1.5 CAL)  356 mL Per Tube QID   feeding supplement (PROSource TF20)  60 mL Per Tube TID   fentaNYL  1 patch Transdermal B63A   folic acid  1 mg Oral Daily   Gerhardt's butt cream   Topical Daily   heparin injection (subcutaneous)  5,000 Units Subcutaneous Q8H   insulin aspart  0-20 Units Subcutaneous Q4H   leptospermum manuka honey  1 Application Topical Daily   loperamide HCl  4 mg Oral TID   midodrine  10 mg Oral 3 times per day on Tue Thu Sat   multivitamin  1 tablet Oral QHS   nutrition supplement (JUVEN)  1 packet Oral BID BM   mouth rinse  15 mL Mouth Rinse 4 times per day   pantoprazole  40 mg Oral BID   QUEtiapine  50 mg Oral QHS   sodium chloride flush  3 mL Intravenous Q12H   thiamine  100 mg Oral Daily    BMET    Component Value Date/Time   NA 137 11/24/2021 0027   NA 135 02/13/2021 0855   K 3.9 11/24/2021 0027   CL 95 (L) 11/24/2021 0027   CO2 27 11/24/2021 0027   GLUCOSE 82 11/24/2021 0027   BUN 23 11/24/2021 0027   BUN 9 02/13/2021 0855   CREATININE 3.80 (H) 11/24/2021 0027   CALCIUM 9.0 11/24/2021 0027   GFRNONAA 17 (L) 11/24/2021 0027   GFRAA >60 08/25/2019 2110   CBC    Component Value Date/Time   WBC 9.3 11/25/2021 0643   RBC 3.64 (L) 11/25/2021 0643   HGB 10.3 (L) 11/25/2021 0643   HGB 15.4 02/13/2021 0855   HCT 33.1 (L) 11/25/2021 0643   HCT 45.7 02/13/2021 0855   PLT 376 11/25/2021 0643   PLT 239 02/13/2021 0855   MCV 90.9 11/25/2021 0643   MCV 88 02/13/2021 0855   MCH 28.3  11/25/2021 0643   MCHC 31.1 11/25/2021 0643   RDW 16.5 (H) 11/25/2021 0643   RDW 11.3 (L) 02/13/2021 0855   LYMPHSABS 2.2 09/29/2021 1956   MONOABS 2.1 (H) 09/29/2021 1956   EOSABS 0.0 09/29/2021 1956   BASOSABS 0.0 09/29/2021 1956    Assessment/Plan:   ESRD - following ischemic ATN in setting of cardiogenic shock/cardiac arrest.  Started CRRT on 10/07/21 and transitioned to IHD on 10/31/21.  s/p [facility] 11/04/21 with IR. Currently on TTS schedule.  Outpatient dialysis arrangements pending [facility] financial clearance.  He does not eat before HD due to diarrhea and requests to be on first shift moving forward so he can eat when it is over, will attempt to arrange for this. HD tomorrow AM. 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  H/o ECMO.  Midodrine being weaned: currently on midodrine 10 mg tid on TTS sched. Anemia of critical illness with ABLA - on ESA and IV iron. Hgb 10.3 today CAD - s/p CABG x 5 vessels on 09/12/21 F/E/N - s/p PEG placement under GA 11/20/21. Unstageable pressure ulcer of left buttock - wound care following. Hyperkalemia - ensure renal diet/low k and phos TF's. K improved/WNL over the last few days, will monitor  Gean Quint, MD [facility]

## 2021-11-25 NOTE — Consult Note (Signed)
[place] Nurse wound follow up Patient receiving care in [place]. Patient is on a low air loss pressure reduction mattress. Wound type: Unstageable PI to coccyx and left buttock. Wound area more consistent with a stage 2 to all other areas of the wound Measurement: entire area measures 12 cm x 9 cm Wound bed: thick yellowish slough to left buttock and coccyx area. Drainage (amount, consistency, odor) tan on existing dressing Periwound: intact Dressing procedure/placement/frequency: Continue the Medihoney. I have requested PT to perform hydrotherapy to left buttock and coccyx area. Val Riles, RN, MSN, CWOCN, CNS-BC, pager [phone]

## 2021-11-25 NOTE — Progress Notes (Signed)
Occupational Therapy Evaluation Patient Details Name: [name] MRN: [id number] DOB: 1960-06-14 Today's Date: 11/25/2021   History of Present Illness Pt is a 61 y.o. male admitted 09/28/21 with SOB, bradycardia, hypotension. Workup for bilateral pleural effusion, pericardial effusion s/p VATS with pericardial window 8/19. Cardiac arrest with tamponade 8/20, intubated with bedside mediastinal exploration, ECMO via fem access. Pt with mediastinal hemorrhage s/p reexploration 8/21. To OR 8/28 for washout, ECMO decannulation, Impella placement. CRRT initiated 8/28. Brief PEA arrest 8/29 with DCCV to NSR.  S/p chest closure 9/2. S/p trach and impella removed 9/7. S/p wound vac removal 9/11. Course complicated by bouts of hypotension and bradycardia. Off CRRT 9/19. Trach decannulated 9/25. Tunned HD cath placement 9/25; iHD initiated. PMH includes CAD (s/p recent CABG 09/12/21), CKD2, DM, HTN, AKI, afib, CVA.   Clinical Impression   Yoandri continues to progress. Overall he required mod A to stand and min G to ambulate with RW. RUE AAROM exercises completed, see below. Pt also donned R U-cuff and demonstrated great use for self feeding. Pt will benefit from RUE splint next session. OT to continue to follow, POC remains appropriate.      Recommendations for follow up therapy are one component of a multi-disciplinary discharge planning process, led by the attending physician.  Recommendations may be updated based on patient status, additional functional criteria and insurance authorization.   Follow Up Recommendations  Skilled nursing-short term rehab (<3 hours/day)    Assistance Recommended at Discharge Frequent or constant Supervision/Assistance  Patient can return home with the following A lot of help with walking and/or transfers;A lot of help with bathing/dressing/bathroom;Assistance with feeding;Assistance with cooking/housework;Direct supervision/assist for medications management;Direct  supervision/assist for financial management;Assist for transportation;Help with stairs or ramp for entrance       Equipment Recommendations  BSC/3in1       Precautions / Restrictions Precautions Precautions: Sternal;Fall;Other (comment) Precaution Booklet Issued: No Precaution Comments: painful sacral/buttocks wound, sternal wound Restrictions Weight Bearing Restrictions: No Other Position/Activity Restrictions: initial CABG 09/12/21 with closure 9/2      Mobility Bed Mobility Overal bed mobility: Needs Assistance Bed Mobility: Sit to Sidelying         Sit to sidelying: Mod assist General bed mobility comments: assist for BLEs    Transfers Overall transfer level: Needs assistance Equipment used: Rolling walker (2 wheels) Transfers: Sit to/from Stand, Bed to chair/wheelchair/BSC Sit to Stand: Mod assist                  Balance Overall balance assessment: Needs assistance Sitting-balance support: Feet supported Sitting balance-Leahy Scale: Fair     Standing balance support: Bilateral upper extremity supported Standing balance-Leahy Scale: Poor                             ADL either performed or assessed with clinical judgement   ADL Overall ADL's : Needs assistance/impaired Eating/Feeding: Minimal assistance Eating/Feeding Details (indicate cue type and reason): with U cuff                     Toilet Transfer: Moderate assistance;Ambulation;Rolling walker (2 wheels) Toilet Transfer Details (indicate cue type and reason): continues to need mod A to stand. once standing he is min G with RW         Functional mobility during ADLs: Moderate assistance General ADL Comments: continues to be limited by RUE, pain and transitional transfers     Vision  Vision Assessment?: Vision impaired- to be further tested in functional context Additional Comments: WFL for tasks this date     Perception Perception Perception: Impaired   Praxis  Praxis Praxis: Impaired    Pertinent Vitals/Pain Pain Assessment Pain Assessment: Faces Faces Pain Scale: Hurts little more Pain Location: perineal wounds Pain Descriptors / Indicators: Sore Pain Intervention(s): Limited activity within patient's tolerance, Monitored during session     Hand Dominance     Extremity/Trunk Assessment Upper Extremity Assessment Upper Extremity Assessment: RUE deficits/detail RUE Deficits / Details: minimal digit flexion; pronation 3/5; supination 4/5; digit extension 2/5; thumb adduciton 0/5; thumb extension 4/5; genralzied weakness in shoulder and shoulder girdle/scapula. ape hand deformity. RUE Sensation: decreased proprioception;decreased light touch RUE Coordination: decreased fine motor;decreased gross motor LUE Deficits / Details: generalized weakness; weaker proximally; able to achieve @ 90 degree FF and ABd; limited ER ; unabl eot reach back of head   Lower Extremity Assessment RLE Deficits / Details: hip flexion grossly 2/5, knee flexion and extension grossly 2/5       Communication     Cognition Arousal/Alertness: Awake/alert Behavior During Therapy: Anxious Overall Cognitive Status: Impaired/Different from baseline Area of Impairment: Attention, Following commands, Problem solving, Safety/judgement                   Current Attention Level: Selective   Following Commands: Follows one step commands consistently Safety/Judgement: Decreased awareness of safety, Decreased awareness of deficits Awareness: Emergent Problem Solving: Slow processing, Requires verbal cues General Comments: anxious about pain and movement. continues to need cues for carryover of precautions and techniques.     General Comments  VSS    Exercises Exercises: Hand exercises Hand Exercises Wrist Flexion: AROM, Right, 10 reps, Seated Wrist Extension: AROM, Right, 15 reps Wrist Ulnar Deviation: AAROM, Right, 15 reps Wrist Radial Deviation: AAROM,  Right, 15 reps Digit Composite Flexion: PROM, AAROM, Right, 20 reps Composite Extension: AROM, AAROM, 20 reps, Right Thumb Abduction: Right, PROM, 15 reps Thumb Adduction: PROM, Right, 15 reps Opposition: PROM, Right, 10 reps   Shoulder Instructions     OT Goals(Current goals can be found in the care plan section) Acute Rehab OT Goals Patient Stated Goal: to get better OT Goal Formulation: With patient Time For Goal Achievement: 12/06/21 Potential to Achieve Goals: Good ADL Goals Pt Will Perform Grooming: with set-up;sitting Pt Will Perform Upper Body Bathing: with set-up;sitting Pt Will Perform Upper Body Dressing: with min assist;sitting Pt Will Transfer to Toilet: with min guard assist;stand pivot transfer;bedside commode Pt Will Perform Toileting - Clothing Manipulation and hygiene: with min assist;sit to/from stand;sitting/lateral leans Pt/caregiver will Perform Home Exercise Program: Increased strength;Increased ROM;Both right and left upper extremity;With minimal assist Additional ADL Goal #1: Pt will demosntrate sustained attention for grooming task in nondistracting environment Additional ADL Goal #2: Pt will complete bed mobility with mod A in preparation for ADL tasks Additional ADL Goal #3: Pt will grasp/release objects using functional pinch pattern with necessary AE/ splinting to increase functional use R hand  OT Frequency: Min 2X/week       AM-PAC OT "6 Clicks" Daily Activity     Outcome Measure Help from another person eating meals?: A Little Help from another person taking care of personal grooming?: A Little Help from another person toileting, which includes using toliet, bedpan, or urinal?: A Lot Help from another person bathing (including washing, rinsing, drying)?: A Lot Help from another person to put on and taking off regular upper body clothing?: A  Lot Help from another person to put on and taking off regular lower body clothing?: A Lot 6 Click Score: 14    End of Session Equipment Utilized During Treatment: Gait belt;Rolling walker (2 wheels) Nurse Communication: Mobility status  Activity Tolerance: Patient tolerated treatment well Patient left: in chair;with call bell/phone within reach;with chair alarm set  OT Visit Diagnosis: Unsteadiness on feet (R26.81);Other abnormalities of gait and mobility (R26.89);Muscle weakness (generalized) (M62.81);Other symptoms and signs involving cognitive function;Pain Pain - Right/Left: Right Pain - part of body: Hand                Time: 3202-3343 OT Time Calculation (min): 15 min Charges:  OT General Charges $OT Visit: 1 Visit OT Treatments $Therapeutic Activity: 8-22 mins   Elliot Cousin 11/25/2021, 6:20 PM

## 2021-11-25 NOTE — Progress Notes (Signed)
Patient ID: [name], male   DOB: September 14, 1960, 61 y.o.   MRN: [id number]    Progress Note from the Palliative Medicine Team at [facility]   Patient Name: [name]        Date: 11/25/2021 DOB: 24-Jun-1960  Age: 61 y.o. MRN#: [id number] Attending Physician: [name], MD Primary Care Physician: [name], MD Admit Date: 09/28/2021   Medical records reviewed, discussed with treatment team, assessed patient at bedside  61 y.o. 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 Bradycardic and hypoglycemic.   ECHO obtained which showed LVEF 50%, grade II diastolic dysfunction and moderate pericardial effusion with possible tamponade physiology.    CT Chest showed pericardial effusion and small bilateral pleural effusions. He was taken to the OR 8/19 for pericardial window.   Complicated hospitalization, today is day [facility] Events 8/19 admitted, s/p pericardial window 8/20 PEA cardiac arrest, left pigtail chest tube placement, PEA cardiac arrest due to tamponade, bedside sternotomy with pericardial drains placed. 8/21 return to OR for overnight bleeding. ECMO 8/22 minimal chest tube output.  Atrial fibrillation, controled with amiodarone.  8/22 TEE showed EF 35% at baseline, which improved significantly with decreasing ECMO flow.  8/23 tolerated diuresis  8/24 mediastinal washout, removal of hematoma 8/25 ongoing bleeding issues from posterior mediastinum 8/29 decannulated and off ECMO, 5.5 Impella placed, L femoral HD cath placed and started on CRRT 8/30 hypoxia and hypotension followed by brief arrest, gentle L lateral chest compressions performed and ROSC in roughly 1 minute. 8/31 remains deeply sedated on ventilator with open chest.  No acute events overnight 9/1 no acute events overnight, remains on ventilator deep sedation.   Tolerating aggressive volume removal per CRRT 9/2 sternotomy closure 9/4 FOB, low grade temps, notable thick mucus/frequent suctioning  - 9/3 Hypotensive overnight w/ SBPs in 80s. Febrile, mTemp 100.8. CRRT paused. VP increased to 0.04.  - 9/4 s/p bronchoscopy w/ BAL by PCCM, Cx NGTD  - 9/5 vomiting w/ large volume NGT output + watery/foul diarrhea, Tube feeds held. No signs of ileus on KUB. C-diff negative   - 9/7 OR for Impella Extraction and percutaneous tracheostomy. 2 u RBCs. - 9/8 CVVH stopped and line removed for holiday - 9/9 CVVH restarted.  -9/13 Worsening leukocytosis and pressor requirements. Purulent drainage from arterial line. Started Daptomycin, Micafungin and Meropenem. - 9/17 Daptomycin + Micafungin stopped. Remains on Meropenum  -9/19 CRRT holiday.  9-19 patient is demonstrating steps and progression, follows  commands, working with therapies, no urine output and worsening BUN and creatinine 9/20 more awake. CRRT off 9/21 cuffless tracheostomy. Do well with PMV 9/22 failed swallow evaluation 9/25 Tolerating trach capping on RA. Up to chair. Trach decannulation today. Tunneled dialysis catheter placement with IR. 9/26 Doing well post-trach decannulation.  S/p TDC with IR, some catheter flow issues overnight. Reattempting HD today. Tolerating HD-  TTS Working with speech, may need PEG for nutritional support, will need ongoing physical rehabilitation. 11-25-21 patient continues to progress, working with therapies/bleeding and hallway with assistance, PEG for nutritional support however patient is progressing with dysphagia 3 diet, tolerating hemodialysis   FYI     Initial palliative medicine consult completed on 09/30/2021.  Established decision makers at that time.  Colette Brandi patient's,  legal wife is main contact and decision maker in the event patient does not  have decision-making capacity for himself.  Patient and his wife are separated.    Patient/[name] continues to  improve cognitively, however I do not believe he has the ability to make complex medical decisions for himself at this time.  Today education was concentrated on his current nutritional status.  Refusing to let them utilize his PEG tube.  After exploration  there was a misunderstanding,  and he  is totally open to utilization of PEG for supplemental nutrition  Attempted to explore [name] understanding of his regarding current medical situation.   He recognizes the debilitated state that he is in.  He hopes to continue to improve, walk and play golf again.   He expresses frustration with his inability to do for himself.  [name] has made measurable progress since admission 52 days ago.  He remains high risk for decompensation.  It will be important to continue conversations regarding goals of care and treatment plan on a regular basis with [name] and his support/ people.  PMT will continue to support holistically  Discussed with bedside RN and dietician.   [name]  Palliative Medicine Team Team Phone # [phone] Pager [phone]

## 2021-11-25 NOTE — Progress Notes (Signed)
Mobility Specialist Progress Note    11/25/21 1610  Mobility  Activity Ambulated with assistance in hallway  Level of Assistance +2 (takes two people) (chair follow)  Assistive Device Front wheel walker  Distance Ambulated (ft) 160 ft (60+50+50)  Activity Response Tolerated well  Mobility Referral Yes  $Mobility charge 1 Mobility   Pre-Mobility: 95 HR Post-Mobility: 98 HR  Pt received in bed and agreeable. No complaints on walk. Took x2 seated rest breaks. Rolled back to room in chair for OT session. Left in chair with OT present.    Hildred Alamin Mobility Specialist  Secure Chat Only

## 2021-11-26 LAB — RENAL FUNCTION PANEL
Albumin: 2.9 g/dL — ABNORMAL LOW (ref 3.5–5.0)
Anion gap: 13 (ref 5–15)
BUN: 40 mg/dL — ABNORMAL HIGH (ref 8–23)
CO2: 27 mmol/L (ref 22–32)
Calcium: 8.8 mg/dL — ABNORMAL LOW (ref 8.9–10.3)
Chloride: 93 mmol/L — ABNORMAL LOW (ref 98–111)
Creatinine, Ser: 6.77 mg/dL — ABNORMAL HIGH (ref 0.61–1.24)
GFR, Estimated: 9 mL/min — ABNORMAL LOW (ref >60)
Glucose, Bld: 115 mg/dL — ABNORMAL HIGH (ref 70–99)
Phosphorus: 5.5 mg/dL — ABNORMAL HIGH (ref 2.5–4.6)
Potassium: 4.2 mmol/L (ref 3.5–5.1)
Sodium: 133 mmol/L — ABNORMAL LOW (ref 135–145)

## 2021-11-26 LAB — GLUCOSE, CAPILLARY
Glucose-Capillary: 108 mg/dL — ABNORMAL HIGH (ref 70–99)
Glucose-Capillary: 117 mg/dL — ABNORMAL HIGH (ref 70–99)
Glucose-Capillary: 83 mg/dL (ref 70–99)
Glucose-Capillary: 83 mg/dL (ref 70–99)
Glucose-Capillary: 91 mg/dL (ref 70–99)

## 2021-11-26 LAB — MAGNESIUM: Magnesium: 1.8 mg/dL (ref 1.7–2.4)

## 2021-11-26 LAB — CBC
HCT: 34 % — ABNORMAL LOW (ref 39.0–52.0)
Hemoglobin: 10.5 g/dL — ABNORMAL LOW (ref 13.0–17.0)
MCH: 28 pg (ref 26.0–34.0)
MCHC: 30.9 g/dL (ref 30.0–36.0)
MCV: 90.7 fL (ref 80.0–100.0)
Platelets: 398 10*3/uL (ref 150–400)
RBC: 3.75 MIL/uL — ABNORMAL LOW (ref 4.22–5.81)
RDW: 16.2 % — ABNORMAL HIGH (ref 11.5–15.5)
WBC: 8.4 10*3/uL (ref 4.0–10.5)
nRBC: 0 % (ref 0.0–0.2)

## 2021-11-26 LAB — HEPATITIS B SURFACE ANTIGEN: Hepatitis B Surface Ag: NONREACTIVE

## 2021-11-26 MED ORDER — HEPARIN SODIUM (PORCINE) 1000 UNIT/ML IJ SOLN
INTRAMUSCULAR | Status: AC
  Filled 2021-11-26: qty 4

## 2021-11-26 MED ORDER — ACETAMINOPHEN 325 MG PO TABS
650.0000 mg | ORAL_TABLET | Freq: Four times a day (QID) | ORAL | Status: DC | PRN
  Administered 2021-11-28: 650 mg via ORAL
  Filled 2021-11-26: qty 2

## 2021-11-26 MED ORDER — OSMOLITE 1.5 CAL PO LIQD
119.0000 mL | Freq: Four times a day (QID) | ORAL | Status: DC
  Administered 2021-11-26 – 2021-11-27 (×2): 119 mL

## 2021-11-26 NOTE — TOC CM/SW Note (Signed)
HF TOC CM received call from pt's wife, [name] stating she received letter from [place] and she had questions. Will send info  for [facility] # [phone]. [place], Heart Failure TOC CM [phone]

## 2021-11-26 NOTE — Progress Notes (Signed)
Inpatient Rehabilitation Admissions Coordinator   Patient not a candidate for Cir admit for he does not have caregiver supports at home that he will need. I continue to recommend SNF as planned ,as I did previously on 9/26. Acute team and TOC made aware.   Danne Baxter, RN, MSN Rehab Admissions Coordinator [phone] 11/26/2021 5:17 PM

## 2021-11-26 NOTE — Progress Notes (Signed)
SLP Cancellation Note  Patient Details Name: [name] MRN: [id number] DOB: Feb 17, 1960   Cancelled treatment:       Reason Eval/Treat Not Completed: Patient at procedure or test/unavailable (HD)   Osie Bond., M.A. [place] Office [phone]  Secure chat preferred  11/26/2021, 9:27 AM

## 2021-11-26 NOTE — Procedures (Signed)
I was present at this dialysis session. I have reviewed the session itself and made appropriate changes.  UFG 2L. VSS.  Filed Weights   11/25/21 0411 11/26/21 0602 11/26/21 0732  Weight: 86.7 kg 82.6 kg 84.1 kg    Recent Labs  Lab 11/26/21 0013  NA 133*  K 4.2  CL 93*  CO2 27  GLUCOSE 115*  BUN 40*  CREATININE 6.77*  CALCIUM 8.8*  PHOS 5.5*    Recent Labs  Lab 11/24/21 0027 11/25/21 0643 11/26/21 0013  WBC 9.0 9.3 8.4  HGB 10.3* 10.3* 10.5*  HCT 34.9* 33.1* 34.0*  MCV 93.8 90.9 90.7  PLT 391 376 398    Scheduled Meds:  aspirin  81 mg Oral Daily   atorvastatin  80 mg Oral Daily   Chlorhexidine Gluconate Cloth  6 each Topical Daily   darbepoetin (ARANESP) injection - DIALYSIS  100 mcg Intravenous Q Tue-HD   feeding supplement (OSMOLITE 1.5 CAL)  356 mL Per Tube QID   fentaNYL  1 patch Transdermal K35W   folic acid  1 mg Oral Daily   Gerhardt's butt cream   Topical Daily   heparin injection (subcutaneous)  5,000 Units Subcutaneous Q8H   insulin aspart  0-20 Units Subcutaneous Q4H   leptospermum manuka honey  1 Application Topical Daily   loperamide HCl  4 mg Oral TID   midodrine  10 mg Oral 3 times per day on Tue Thu Sat   multivitamin  1 tablet Oral QHS   mouth rinse  15 mL Mouth Rinse 4 times per day   pantoprazole  40 mg Oral BID   QUEtiapine  50 mg Oral QHS   sodium chloride flush  3 mL Intravenous Q12H   thiamine  100 mg Oral Daily   Continuous Infusions: PRN Meds:.acetaminophen (TYLENOL) oral liquid 160 mg/5 mL, camphor-menthol, clonazePAM, dextrose, diphenhydrAMINE, HYDROmorphone (DILAUDID) injection, iohexol, ipratropium-albuterol, metoprolol tartrate, ondansetron (ZOFRAN) IV, mouth rinse, [EXPIRED] oxyCODONE **FOLLOWED BY** oxyCODONE, polyvinyl alcohol, sodium chloride flush, white petrolatum   Gean Quint, MD [facility] 11/26/2021, 11:15 AM

## 2021-11-26 NOTE — Plan of Care (Signed)
  Problem: Education: Goal: Knowledge of disease or condition will improve Outcome: Progressing Goal: Knowledge of the prescribed therapeutic regimen will improve Outcome: Progressing   Problem: Activity: Goal: Risk for activity intolerance will decrease Outcome: Progressing   Problem: Cardiac: Goal: Will achieve and/or maintain hemodynamic stability Outcome: Progressing   Problem: Respiratory: Goal: Respiratory status will improve Outcome: Progressing   Problem: Urinary Elimination: Goal: Ability to achieve and maintain adequate renal perfusion and functioning will improve Outcome: Progressing

## 2021-11-26 NOTE — Progress Notes (Signed)
Occupational Therapy Treatment Patient Details Name: [name] MRN: [id number] DOB: 10-12-1960 Today's Date: 11/26/2021   History of present illness Pt is a 61 y.o. male admitted 09/28/21 with SOB, bradycardia, hypotension. Workup for bilateral pleural effusion, pericardial effusion s/p VATS with pericardial window 8/19. Cardiac arrest with tamponade 8/20, intubated with bedside mediastinal exploration, ECMO via fem access. Pt with mediastinal hemorrhage s/p reexploration 8/21. To OR 8/28 for washout, ECMO decannulation, Impella placement. CRRT initiated 8/28. Brief PEA arrest 8/29 with DCCV to NSR.  S/p chest closure 9/2. S/p trach and impella removed 9/7. S/p wound vac removal 9/11. Course complicated by bouts of hypotension and bradycardia. Off CRRT 9/19. Trach decannulated 9/25. Tunned HD cath placement 9/25; iHD initiated. PMH includes CAD (s/p recent CABG 09/12/21), CKD2, DM, HTN, AKI, afib, CVA.   OT comments  Pt seen for intrinsic plus splint fabrication this date. Splint to be maintained during night time hours only, should there be skin integrity concerns discontinue and notify OT. Order placed and wear schedule placed in room, RN notified. Pt also given RUE walker splint, however it may need adjustments as the pt did not find it comfortable. Transferred to [facility] with mod A x3, rear peri hygiene completed with max A due to peri wounds. OT to continue to follow. POC remains appropriate.    Recommendations for follow up therapy are one component of a multi-disciplinary discharge planning process, led by the attending physician.  Recommendations may be updated based on patient status, additional functional criteria and insurance authorization.    Follow Up Recommendations  Skilled nursing-short term rehab (<3 hours/day)    Assistance Recommended at Discharge Frequent or constant Supervision/Assistance  Patient can return home with the following  A lot of help with walking and/or transfers;A  lot of help with bathing/dressing/bathroom;Assistance with feeding;Assistance with cooking/housework;Direct supervision/assist for medications management;Direct supervision/assist for financial management;Assist for transportation;Help with stairs or ramp for entrance   Equipment Recommendations  BSC/3in1       Precautions / Restrictions Precautions Precautions: Sternal;Fall;Other (comment) Restrictions Weight Bearing Restrictions: No Other Position/Activity Restrictions: initial CABG 09/12/21 with closure 9/2       Mobility Bed Mobility Overal bed mobility: Needs Assistance Bed Mobility: Sidelying to Sit, Sit to Sidelying   Sidelying to sit: Min assist Supine to sit: Mod assist          Transfers Overall transfer level: Needs assistance Equipment used: Rolling walker (2 wheels) Transfers: Sit to/from Stand, Bed to chair/wheelchair/BSC Sit to Stand: Mod assist     Step pivot transfers: Min guard           Balance Overall balance assessment: Needs assistance Sitting-balance support: Feet supported Sitting balance-Leahy Scale: Fair     Standing balance support: Bilateral upper extremity supported Standing balance-Leahy Scale: Poor                             ADL either performed or assessed with clinical judgement   ADL Overall ADL's : Needs assistance/impaired                         Toilet Transfer: Moderate assistance;Stand-pivot;BSC/3in1;Rolling walker (2 wheels)   Toileting- Clothing Manipulation and Hygiene: Maximal assistance;Sit to/from stand Toileting - Clothing Manipulation Details (indicate cue type and reason): unable to complete pericare       General ADL Comments: continues to require mod A to stand. limited by frequent BMs this date  Extremity/Trunk Assessment Upper Extremity Assessment Upper Extremity Assessment: RUE deficits/detail RUE Deficits / Details: minimal digit flexion; pronation 3/5; supination 4/5; digit  extension 2/5; thumb adduciton 0/5; thumb extension 4/5; genralzied weakness in shoulder and shoulder girdle/scapula. ape hand deformity.             Cognition Arousal/Alertness: Awake/alert Behavior During Therapy: Anxious Overall Cognitive Status: Impaired/Different from baseline                                 General Comments: continues to be anxious about pain. Session focused on splinting. Pt with good understanding of splint wear/care schedule              General Comments VSS on RA. Session focused on R hand splint and RW splint    Pertinent Vitals/ Pain       Pain Assessment Pain Assessment: Faces Pain Score: 0-No pain Pain Intervention(s): Monitored during session   Frequency  Min 2X/week        Progress Toward Goals  OT Goals(current goals can now be found in the care plan section)  Progress towards OT goals: Progressing toward goals  Acute Rehab OT Goals Patient Stated Goal: to get out of the hospital OT Goal Formulation: With patient Time For Goal Achievement: 12/06/21 Potential to Achieve Goals: Good ADL Goals Pt Will Perform Grooming: with set-up;sitting Pt Will Perform Upper Body Bathing: with set-up;sitting Pt Will Perform Upper Body Dressing: with min assist;sitting Pt Will Transfer to Toilet: with min guard assist;stand pivot transfer;bedside commode Pt Will Perform Toileting - Clothing Manipulation and hygiene: with min assist;sit to/from stand;sitting/lateral leans Pt/caregiver will Perform Home Exercise Program: Increased strength;Increased ROM;Both right and left upper extremity;With minimal assist Additional ADL Goal #1: Pt will demosntrate sustained attention for grooming task in nondistracting environment Additional ADL Goal #2: Pt will complete bed mobility with mod A in preparation for ADL tasks Additional ADL Goal #3: Pt will grasp/release objects using functional pinch pattern with necessary AE/ splinting to increase  functional use R hand  Plan Discharge plan remains appropriate       AM-PAC OT "6 Clicks" Daily Activity     Outcome Measure   Help from another person eating meals?: A Little Help from another person taking care of personal grooming?: A Little Help from another person toileting, which includes using toliet, bedpan, or urinal?: A Lot Help from another person bathing (including washing, rinsing, drying)?: A Lot Help from another person to put on and taking off regular upper body clothing?: A Lot Help from another person to put on and taking off regular lower body clothing?: A Lot 6 Click Score: 14    End of Session    OT Visit Diagnosis: Unsteadiness on feet (R26.81);Other abnormalities of gait and mobility (R26.89);Muscle weakness (generalized) (M62.81);Other symptoms and signs involving cognitive function;Pain Pain - Right/Left: Right Pain - part of body: Hand   Activity Tolerance Patient tolerated treatment well   Patient Left in bed;with call bell/phone within reach   Nurse Communication Mobility status (splint wear care schedule)        Time: 1620-1740 OT Time Calculation (min): 80 min  Charges: OT General Charges $OT Visit: 1 Visit OT Treatments $Self Care/Home Management : 23-37 mins $Orthotics Fit/Training: 38-52 mins $ Splint materials basic: 1 Supply $ Walker splint: 1 Supply   Elliot Cousin 11/26/2021, 5:42 PM

## 2021-11-26 NOTE — Progress Notes (Signed)
Physical Therapy Wound Evaluation/Treatment Patient Details  Name: [name] MRN: [id number] Date of Birth: September 19, 1960  Today's Date: 11/26/2021 Time: 4098-1191 Time Calculation (min): 60 min  Subjective  Subjective Assessment Subjective: Pt pleasant and agreeable to hydrotherapy. Patient and Family Stated Goals: Heal wound, decrease pain Date of Onset:  (Unknown) Prior Treatments: Dressing changes, Dakin's solution  Pain Score:  Faces pain scale: 8/10. Premedicated with PO meds.   Wound Assessment  Pressure Injury 10/12/21 Sacrum Mid;Lower;Bilateral Unstageable - Full thickness tissue loss in which the base of the injury is covered by slough (yellow, tan, gray, green or brown) and/or eschar (tan, brown or black) in the wound bed. (Active)  Wound Image   11/26/21 1539  Dressing Type Foam - Lift dressing to assess site every shift;Gauze (Comment);Honey;Barrier Film (skin prep) 11/26/21 1539  Dressing Clean, Dry, Intact;Changed 11/26/21 1539  Dressing Change Frequency Daily 11/26/21 1539  State of Healing Eschar 11/26/21 1539  Site / Wound Assessment Black;Pink;Yellow 11/26/21 1539  % Wound base Red or Granulating 40% 11/26/21 1539  % Wound base Yellow/Fibrinous Exudate 50% 11/26/21 1539  % Wound base Black/Eschar 10% 11/26/21 1539  % Wound base Other/Granulation Tissue (Comment) 0% 11/26/21 1539  Peri-wound Assessment Intact 11/26/21 1539  Wound Length (cm) 13 cm 11/26/21 1539  Wound Width (cm) 11 cm 11/26/21 1539  Wound Depth (cm) 2.5 cm 11/26/21 1539  Wound Surface Area (cm^2) 143 cm^2 11/26/21 1539  Wound Volume (cm^3) 357.5 cm^3 11/26/21 1539  Tunneling (cm) 0 11/26/21 1539  Undermining (cm) 0 11/26/21 1539  Margins Unattached edges (unapproximated) 11/26/21 1539  Drainage Amount Moderate 11/26/21 1539  Drainage Description Purulent 11/26/21 1539  Treatment Debridement (Selective);Irrigation;Packing (Dry gauze) 11/26/21 1539      Selective Debridement  (non-excisional) Selective Debridement (non-excisional) - Location: Sacrum Selective Debridement (non-excisional) - Tools Used: Forceps, Scalpel, Scissors Selective Debridement (non-excisional) - Tissue Removed: Eschar    Wound Assessment and Plan  Wound Therapy - Assess/Plan/Recommendations Wound Therapy - Clinical Statement: This patient presents to wound therapy with an unstagable pressure injury to the sacrum. Debridement initiated this session and a significant amount of necrotic tissue was able to be removed. Pt reports high pain during treatment, and messaged MD regarding potential for IV pain meds prior to next session for improved pain control. This patient will benefit from continued hydrotherapy for selective removal of unviable tissue, to decrease bioburden, and promote wound bed healing. Wound Therapy - Functional Problem List: Global weakness in the setting of extended hospital stay and multiple medical problems. Factors Delaying/Impairing Wound Healing: Diabetes Mellitus, Immobility, Multiple medical problems, Polypharmacy Hydrotherapy Plan: Debridement, Dressing change, Patient/family education, Pulsatile lavage with suction Wound Therapy - Frequency: Other (comment) (2x/week) Wound Therapy - Follow Up Recommendations: dressing changes by RN  Wound Therapy Goals- Improve the function of patient's integumentary system by progressing the wound(s) through the phases of wound healing (inflammation - proliferation - remodeling) by: Wound Therapy Goals - Improve the function of patient's integumentary system by progressing the wound(s) through the phases of wound healing by: Decrease Necrotic Tissue to: 20% Decrease Necrotic Tissue - Progress: Goal set today Increase Granulation Tissue to: 80% Increase Granulation Tissue - Progress: Goal set today Goals/treatment plan/discharge plan were made with and agreed upon by patient/family: Yes Time For Goal Achievement: 7 days Wound Therapy  - Potential for Goals: Good  Goals will be updated until maximal potential achieved or discharge criteria met.  Discharge criteria: when goals achieved, discharge from hospital, MD decision/surgical intervention, no  progress towards goals, refusal/missing three consecutive treatments without notification or medical reason.  GP     Charges PT Wound Care Charges $Wound Debridement up to 20 cm: < or equal to 20 cm $ Wound Debridement each add'l 20 sqcm: 7 $PT Hydrotherapy Visit: 1 Visit       Thelma Comp 11/26/2021, 3:47 PM  Rolinda Roan, PT, DPT Acute Rehabilitation Services Secure Chat Preferred Office: [phone]

## 2021-11-26 NOTE — Plan of Care (Signed)
Problem: Education: Goal: Will demonstrate proper wound care and an understanding of methods to prevent future damage Outcome: Progressing Goal: Knowledge of disease or condition will improve Outcome: Progressing Goal: Knowledge of the prescribed therapeutic regimen will improve Outcome: Progressing Goal: Individualized Educational Video(s) Outcome: Progressing   Problem: Activity: Goal: Risk for activity intolerance will decrease Outcome: Progressing   Problem: Cardiac: Goal: Will achieve and/or maintain hemodynamic stability Outcome: Progressing   Problem: Clinical Measurements: Goal: Postoperative complications will be avoided or minimized Outcome: Progressing   Problem: Respiratory: Goal: Respiratory status will improve Outcome: Progressing   Problem: Skin Integrity: Goal: Wound healing without signs and symptoms of infection Outcome: Progressing Goal: Risk for impaired skin integrity will decrease Outcome: Progressing   Problem: Urinary Elimination: Goal: Ability to achieve and maintain adequate renal perfusion and functioning will improve Outcome: Progressing   Problem: Education: Goal: Understanding of cardiac disease, CV risk reduction, and recovery process will improve Outcome: Progressing Goal: Individualized Educational Video(s) Outcome: Progressing   Problem: Activity: Goal: Ability to tolerate increased activity will improve Outcome: Progressing   Problem: Cardiac: Goal: Ability to achieve and maintain adequate cardiovascular perfusion will improve Outcome: Progressing   Problem: Health Behavior/Discharge Planning: Goal: Ability to safely manage health-related needs after discharge will improve Outcome: Progressing   Problem: Education: Goal: Knowledge of General Education information will improve Description: Including pain rating scale, medication(s)/side effects and non-pharmacologic comfort measures Outcome: Progressing   Problem: Health  Behavior/Discharge Planning: Goal: Ability to manage health-related needs will improve Outcome: Progressing   Problem: Clinical Measurements: Goal: Ability to maintain clinical measurements within normal limits will improve Outcome: Progressing Goal: Will remain free from infection Outcome: Progressing Goal: Diagnostic test results will improve Outcome: Progressing Goal: Respiratory complications will improve Outcome: Progressing Goal: Cardiovascular complication will be avoided Outcome: Progressing   Problem: Activity: Goal: Risk for activity intolerance will decrease Outcome: Progressing   Problem: Nutrition: Goal: Adequate nutrition will be maintained Outcome: Progressing   Problem: Coping: Goal: Level of anxiety will decrease Outcome: Progressing   Problem: Elimination: Goal: Will not experience complications related to bowel motility Outcome: Progressing Goal: Will not experience complications related to urinary retention Outcome: Progressing   Problem: Pain Managment: Goal: General experience of comfort will improve Outcome: Progressing   Problem: Safety: Goal: Ability to remain free from injury will improve Outcome: Progressing   Problem: Skin Integrity: Goal: Risk for impaired skin integrity will decrease Outcome: Progressing   Problem: Education: Goal: Ability to describe self-care measures that may prevent or decrease complications (Diabetes Survival Skills Education) will improve Outcome: Progressing Goal: Individualized Educational Video(s) Outcome: Progressing   Problem: Coping: Goal: Ability to adjust to condition or change in health will improve Outcome: Progressing   Problem: Fluid Volume: Goal: Ability to maintain a balanced intake and output will improve Outcome: Progressing   Problem: Health Behavior/Discharge Planning: Goal: Ability to identify and utilize available resources and services will improve Outcome: Progressing Goal:  Ability to manage health-related needs will improve Outcome: Progressing   Problem: Metabolic: Goal: Ability to maintain appropriate glucose levels will improve Outcome: Progressing   Problem: Nutritional: Goal: Maintenance of adequate nutrition will improve Outcome: Progressing Goal: Progress toward achieving an optimal weight will improve Outcome: Progressing   Problem: Skin Integrity: Goal: Risk for impaired skin integrity will decrease Outcome: Progressing   Problem: Tissue Perfusion: Goal: Adequacy of tissue perfusion will improve Outcome: Progressing   Problem: Activity: Goal: Ability to tolerate increased activity will improve Outcome: Progressing   Problem: Health  Behavior/Discharge Planning: Goal: Ability to manage tracheostomy will improve Outcome: Progressing

## 2021-11-26 NOTE — Progress Notes (Signed)
Mobility Specialist Progress Note   11/26/21 1359  Mobility  Activity Ambulated with assistance in hallway;Transferred to/from [facility]  Level of Assistance Minimal assist, patient does 75% or more  Assistive Device Front wheel walker  Distance Ambulated (ft) 35 ft  Range of Motion/Exercises Active;All extremities  Activity Response Tolerated well   Patient received in supine agreeable to participate in mobility. Required mod A to EOB and sit to stand. Ambulated min guard with slow steady gait.  Required seated rest break x1 secondary to fatigue. Returned to room and sat on [facility] for BM without complaint or incident. Was left with all needs met, call bell in reach.   [name], [place], [place]  Office: [phone]

## 2021-11-26 NOTE — Progress Notes (Addendum)
Patient ID: [name], male   DOB: 11/28/1960, 61 y.o.   MRN: [id number]   Advanced Heart Failure Rounding Note   Subjective:    - 8/19 Pericardial window - 8/20 Cardiac arrest with tamponade -> Emergent bedside washout - 09/29/21 VA Cannulation - 09/30/21 Return to OR for mediastinal hemorrhage - 10/01/21 Developed AF -> amio - 10/02/21 TEE EF 25-30%  - 10/04/21 OR for washout. C/b continued bleeding overnight - 10/07/21 Placement of Impella 5.5 with washout, VA ECMO decannulation. Hypotensive with development of severe RV dysfunction after ECMO off and pressors titrated up. Multiple units of blood products. - 10/08/21 Brief PEA arrest. AFL with RVR >> S/p DCCV to SR, back in AFL shortly after - 8/31 Give 1UPRBCs  - 9/2 OR for chest closure - 9/3 Hypotensive overnight w/ SBPs in 80s. Febrile, mTemp 100.8. CRRT paused. VP increased to 0.04.  - 9/4 s/p bronchoscopy w/ BAL by PCCM, Cx NGTD  - 9/5 vomiting w/ large volume NGT output + watery/foul diarrhea, Tube feeds held. No signs of ileus on KUB. C-diff negative   - 9/7 OR for Impella Extraction and percutaneous tracheostomy. 2 u RBCs. - 9/8 CVVH stopped and line removed for holiday - 9/9 CVVH restarted.  -9/13 Worsening leukocytosis and pressor requirements. Purulent drainage from arterial line. Started Daptomycin, Micafungin and Meropenem. - 9/17 Daptomycin + Micafungin stopped. Remains on Meropenum  - 9/21 transitioned to iHD  - 9/25 Tunneled HD cath placed. Trach decannulation - 9/26 Hgb 6.1>>transfused 2uRBC  - 10/11 gtube placed  Underwent G-tube placement 10/11. Now tolerating PO.   SBP 110s. Midodrine weaned for only HD days.   Seen while in HD. Feeling good today. No complaints this am. Ambulated x2 yesterday. Denies CP and SOB.  Objective:     Vital Signs:   Temp:  [97.7 F (36.5 C)-98.6 F (37 C)] 98.1 F (36.7 C) (10/17 0735) Pulse Rate:  [81-95] 90 (10/17 0930) Resp:  [17-30] 17 (10/17 0930) BP: (98-122)/(69-81)  98/73 (10/17 0930) SpO2:  [96 %-100 %] 96 % (10/17 0800) Weight:  [82.6 kg-84.1 kg] 84.1 kg (10/17 0732) Last BM Date : 11/22/21  Weight change: Filed Weights   11/25/21 0411 11/26/21 0602 11/26/21 0732  Weight: 86.7 kg 82.6 kg 84.1 kg    Intake/Output:  No intake or output data in the 24 hours ending 11/26/21 0938  General:  well appearing.  No respiratory difficulty HEENT: normal Neck: supple. JVD ~9 cm. Carotids 2+ bilat; no bruits. No lymphadenopathy or thyromegaly appreciated. Cor: PMI nondisplaced. Regular rate & rhythm. No rubs, gallops or murmurs. Lungs: clear, diminished bases Abdomen: soft, nontender, nondistended. No hepatosplenomegaly. No bruits or masses. Good bowel sounds. Extremities: no cyanosis, clubbing, rash, edema  Neuro: alert & oriented x 3, cranial nerves grossly intact. moves all 4 extremities w/o difficulty. Affect pleasant.   Telemetry: NSR 90s  Labs: Basic Metabolic Panel: Recent Labs  Lab 11/22/21 0046 11/23/21 1012 11/24/21 0027 11/25/21 0643 11/26/21 0013  NA 136 136 137 133* 133*  K 4.5 4.7 3.9 3.6 4.2  CL 94* 93* 95* 95* 93*  CO2 28 21* _0 GLUCOSE 84 86 82 93 115*  BUN 46* 59* 23 34* 40*  CREATININE 4.41* 6.70* 3.80* 5.65* 6.77*  CALCIUM 9.0 9.5 9.0 8.9 8.8*  MG 1.8 1.9 1.7 1.7 1.8  PHOS 4.0 5.6* 3.6 4.9* 5.5*    Liver Function Tests: Recent Labs  Lab 11/22/21 0046 11/23/21 1012 11/24/21 0027 11/25/21 0643 11/26/21  0013  ALBUMIN 2.9* 3.1* 3.1* 2.9* 2.9*   No results for input(s): "LIPASE", "AMYLASE" in the last 168 hours. No results for input(s): "AMMONIA" in the last 168 hours.  CBC: Recent Labs  Lab 11/23/21 1012 11/23/21 1350 11/24/21 0027 11/25/21 0643 11/26/21 0013  WBC 9.2 9.0 9.0 9.3 8.4  HGB 10.6* 10.0* 10.3* 10.3* 10.5*  HCT 35.5* 32.3* 34.9* 33.1* 34.0*  MCV 93.9 92.8 93.8 90.9 90.7  PLT 446* 416* 391 376 398    Cardiac Enzymes: No results for input(s): "CKTOTAL", "CKMB", "CKMBINDEX",  "TROPONINI" in the last 168 hours.   BNP: BNP (last 3 results) Recent Labs    12/24/20 0651 09/28/21 0405  BNP 412.0* 826.6*    ProBNP (last 3 results) No results for input(s): "PROBNP" in the last 8760 hours.    Other results:  Imaging: No results found.   Medications:     Scheduled Medications:  aspirin  81 mg Oral Daily   atorvastatin  80 mg Oral Daily   Chlorhexidine Gluconate Cloth  6 each Topical Daily   darbepoetin (ARANESP) injection - DIALYSIS  100 mcg Intravenous Q Tue-HD   feeding supplement (OSMOLITE 1.5 CAL)  356 mL Per Tube QID   fentaNYL  1 patch Transdermal I71I   folic acid  1 mg Oral Daily   Gerhardt's butt cream   Topical Daily   heparin injection (subcutaneous)  5,000 Units Subcutaneous Q8H   insulin aspart  0-20 Units Subcutaneous Q4H   leptospermum manuka honey  1 Application Topical Daily   loperamide HCl  4 mg Oral TID   midodrine  10 mg Oral 3 times per day on Tue Thu Sat   multivitamin  1 tablet Oral QHS   mouth rinse  15 mL Mouth Rinse 4 times per day   pantoprazole  40 mg Oral BID   QUEtiapine  50 mg Oral QHS   sodium chloride flush  3 mL Intravenous Q12H   thiamine  100 mg Oral Daily    Infusions:    PRN Medications: acetaminophen (TYLENOL) oral liquid 160 mg/5 mL, camphor-menthol, clonazePAM, dextrose, diphenhydrAMINE, HYDROmorphone (DILAUDID) injection, iohexol, ipratropium-albuterol, metoprolol tartrate, ondansetron (ZOFRAN) IV, mouth rinse, [EXPIRED] oxyCODONE **FOLLOWED BY** oxyCODONE, polyvinyl alcohol, sodium chloride flush, white petrolatum   Assessment/Plan:    1. Shock - mixed cardiogenic/hemorrhagic -> VA ECMO -> decannulated on 8/28 to Impella 5.5 - Echo 08/28: Underfilled LV, EF 55-60% with severe LVH and near normal RV systsystolic function.  - Impella extracted 9/7 - Bedside echo 09/13 - LV function preserved, RV mildly reduced. Small clot in posterior pericardium but no hemodynamic effect  - Completed total of  14 days of Micafungin (ended 9/27) - Off pressors.  - Previously on midodrine 20 TID >>> now down to 10 TID on HD days. BP stable this am while in HD  2. Cardiac arrest (PEA/bradycardic) - 8/19 and 8/28 in setting of tamponade - resolved  3. Cardiac tamponade with emergent bedside sternotomy  - Diffuse epicardial bleeding with post-op Dresslers - return to OR 8/21 and 8/24 for washouts.  - Washout 8/28 in OR. Multiple units of blood products in OR.   - Chest closed on 9/2 - Hgb stable 10.5 today   4. Acute hypoxemic respiratory failure - Off ECMO.  - Perc Trach placed 9/7  - s/p bronchoscopy w/ BAL 9/4.  - tracheal aspirate 09/13 growing few GPC and rare yeast  - Started back on IV abx as above d/t concern for sepsis.  -  Trach downsized - Trach decannulated 9/25  - Resolved. On room air.   5. AKI due to ATN - CRRT started 08/28.  - Remains anuric - Transitioned to [facility] 9/21. Nephrology following  - s/p [facility] cath by IR   - tolerating iHD (TTS schedule).   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 Now off colchicine   7. 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 Continue ASA 81 mg daily  8. DM2 - continue SSI   9. PAF/AFL - Tolerates poorly.  - Recurrent AFL. S/p DCCV to SR 08/29.  - Off amio d/t bradycardia - Not on AC for now d/t bleeding, DVT dose heparin.  - In NSR today  10. ID - Completed course of abx with open chest.  - Prior possible aspiration with vomiting treated with abx.  - No s/s of ongoing infection  11. FEN - TFs ongoing, issues with BMs. Refusing bolus feeds now 2/2 diarrhea - Underwent gastrostomy tube placement 10/11 - Modified Barium Swallow 10/13: now tolerating PO meals/intake - SLP following   12. Hyperkalemia - CMET pending today, 4.2 yesterday - has received Lokelma in the past for ^^K  13. Unstageable Pressure Ulcer, Buttock  - WOC following.  - Improving, still very painful  14. Fall - management per  primary team  15. Diarrhea - mgmt per primary - Imodium, some improvement  Outpatient HD chair approved.  PT/OT following.Needs SNF for rehab at discharge but has no insurance. HF TOC CM assisting with disposition.  Earnie Larsson, AGACNP-BC  9:38 AM  Patient seen and examined with the above-signed Advanced Practice Provider and/or Housestaff. I personally reviewed laboratory data, imaging studies and relevant notes. I independently examined the patient and formulated the important aspects of the plan. I have edited the note to reflect any of my changes or salient points. I have personally discussed the plan with the patient and/or family.  Stable from cardiac perspective. Requiring midodrine only on HD days. Rhythm stable. Now on D3 diet  General:  Sitting up No resp difficulty HEENT: normal Neck: supple. no JVD. Carotids 2+ bilat; no bruits. No lymphadenopathy or thryomegaly appreciated. Cor: PMI nondisplaced. Regular rate & rhythm. No rubs, gallops or murmurs. Lungs: clear Abdomen: soft, nontender, nondistended. No hepatosplenomegaly. No bruits or masses. Good bowel sounds. Extremities: no cyanosis, clubbing, rash, edema Neuro: alert & orientedx3, cranial nerves grossly intact. RUE weak Affect pleasant  Stable from cardiac standpoint. No new recs today.   Glori Bickers, MD  12:17 PM

## 2021-11-26 NOTE — Progress Notes (Signed)
Brief Nutrition Note  RD present when pt received first bolus of Osmolite 1.5 cal formula this afternoon at 1600. Bolus volume was 119 ml (half carton) and was administered via gravity per PEG. Pt did not receive bolus feeds yesterday because staff was attempting to administer via pump which was not what was discussed with pt. Pt was supposed to be receiving bolus feeds via syringe via gravity. Given the confusion, tube feeds were not administered. Bolus tube feeding steps have been discussed with RN.  Pt would like to continue to receive half a carton at all feedings until RD is able to check in with him tomorrow.  Will adjust tube feeding orders: - 119 ml (half carton) Osmolite 1.5 cal formula QID - Flush PEG with 20 ml free water before and after each bolus  Adjusted tube feeding regimen over 24 hours will provide 710 kcal, 30 grams of protein, and 362 ml of H2O.  Total free water with flushes: 442 ml  RD will check back in tomorrow AM and adjust tube feeding orders at that time.   Mark Bryant, MS, RD, LDN Inpatient Clinical Dietitian Please see AMiON for contact information.

## 2021-11-26 NOTE — Progress Notes (Signed)
Orthopedic Tech Progress Note Patient Details:  [name] 1960-12-10 [id number]  Called in order to [employer] for a INTRINSIC HAND SPLINT   Patient ID: [name], male   DOB: May 13, 1960, 61 y.o.   MRN: [id number]  Janit Pagan 11/26/2021, 5:35 PM

## 2021-11-26 NOTE — Progress Notes (Signed)
Received patient in bed to unit.  Alert and oriented.  Informed consent signed and in chart.   Treatment initiated: 0739 Treatment completed: 1204  Patient tolerated well.  Transported back to the room  Alert, without acute distress.  Hand-off given to patient's nurse.   Access used: LIJ HD catheter Access issues: NO  Total UF removed: 2023ml Medication(s) given: Aranesp 179mcg Post HD VS: 96/63, 94, 26, 98%RA Post HD weight: 83.6kgs   [facility]

## 2021-11-26 NOTE — Progress Notes (Addendum)
      [address]       [place] [address]             [phone]       6 Days Post-Op Procedure(s) (LRB): G-Tube Placement (N/A) Subjective: Patient in dialysis, states he feels tired. His bottom hurts with movement but has improved.  Objective: Vital signs in last 24 hours: Temp:  [97.7 F (36.5 C)-98.6 F (37 C)] 97.7 F (36.5 C) (10/17 0344) Pulse Rate:  [81-95] 94 (10/17 0344) Cardiac Rhythm: Normal sinus rhythm (10/17 0726) Resp:  [18-23] 22 (10/17 0646) BP: (111-122)/(69-81) 121/72 (10/17 0344) Weight:  [82.6 kg-84.1 kg] 84.1 kg (10/17 0732)  Hemodynamic parameters for last 24 hours:    Intake/Output from previous day: No intake/output data recorded. Intake/Output this shift: No intake/output data recorded.  General appearance: alert, cooperative, and no distress Neurologic: intact Heart: regular rate and rhythm, S1, S2 normal, no murmur, click, rub or gallop Lungs: clear to auscultation bilaterally Abdomen: soft, non-tender; bowel sounds normal; no masses,  no organomegaly Extremities: extremities normal, atraumatic, no cyanosis or edema Wound: Sternal wound and sacral wound  dressings clean, dry, intact  Lab Results: Recent Labs    11/25/21 0643 11/26/21 0013  WBC 9.3 8.4  HGB 10.3* 10.5*  HCT 33.1* 34.0*  PLT 376 398   BMET:  Recent Labs    11/25/21 0643 11/26/21 0013  NA 133* 133*  K 3.6 4.2  CL 95* 93*  CO2 26 27  GLUCOSE 93 115*  BUN 34* 40*  CREATININE 5.65* 6.77*  CALCIUM 8.9 8.8*    PT/INR: No results for input(s): "LABPROT", "INR" in the last 72 hours. ABG    Component Value Date/Time   PHART 7.301 (L) 10/22/2021 2354   HCO3 19.3 (L) 10/22/2021 2354   TCO2 20 (L) 10/22/2021 2354   ACIDBASEDEF 7.0 (H) 10/22/2021 2354   O2SAT 71.7 11/02/2021 0439   CBG (last 3)  Recent Labs    11/25/21 1932 11/25/21 2322 11/26/21 0329  GLUCAP 82 119* 70    Assessment/Plan: S/P Procedure(s) (LRB): G-Tube Placement  (N/A)  CV: NSR, HR 92. SBP 121, midodrine on HD days.  Pulm: Saturating 98% on RA. Continue IS and ambulation with PT GI: G tube placed last week. Passed swallow study. Started on dysphagia 2 diet. Has been refusing tube feeds due to diarrhea, has not had good PO intake. PO diet improved since he was transitioned to dysphagia 3 diet. Understands he still needs tube feed supplements. No diarrhea. Endo: CBG 82/119/83 Renal: ESRD requiring hemodialysis, tolerating well. K 4.2. Wounds/ID: Continue dressing changes on sternal and sacral wounds. WBC stable 8.4. No sign of infection. Deconditioning: Continue PT/OT Dispo: Awaiting insurance, letter of guarantee for SNF. May need CIR, understands he needs to continue to work with PT to be accepted to CIR...still may have trouble getting accepted without insurance.   LOS: 42 days    Magdalene River, PA-C 11/26/2021   Chart reviewed, patient examined, agree with above. Bolus tube feeds started as noted by RD. Continue to encourage po diet.  Main complaint is pain from sacral pressure sore which looks better in picture than the last time I saw it. He was turned down for CIR since he has no full time support person at home.

## 2021-11-27 LAB — GLUCOSE, CAPILLARY
Glucose-Capillary: 107 mg/dL — ABNORMAL HIGH (ref 70–99)
Glucose-Capillary: 120 mg/dL — ABNORMAL HIGH (ref 70–99)
Glucose-Capillary: 145 mg/dL — ABNORMAL HIGH (ref 70–99)
Glucose-Capillary: 89 mg/dL (ref 70–99)
Glucose-Capillary: 95 mg/dL (ref 70–99)
Glucose-Capillary: 99 mg/dL (ref 70–99)

## 2021-11-27 LAB — RENAL FUNCTION PANEL
Albumin: 2.8 g/dL — ABNORMAL LOW (ref 3.5–5.0)
Anion gap: 12 (ref 5–15)
BUN: 22 mg/dL (ref 8–23)
CO2: 29 mmol/L (ref 22–32)
Calcium: 8.5 mg/dL — ABNORMAL LOW (ref 8.9–10.3)
Chloride: 95 mmol/L — ABNORMAL LOW (ref 98–111)
Creatinine, Ser: 4.51 mg/dL — ABNORMAL HIGH (ref 0.61–1.24)
GFR, Estimated: 14 mL/min — ABNORMAL LOW (ref >60)
Glucose, Bld: 87 mg/dL (ref 70–99)
Phosphorus: 3.3 mg/dL (ref 2.5–4.6)
Potassium: 4.1 mmol/L (ref 3.5–5.1)
Sodium: 136 mmol/L (ref 135–145)

## 2021-11-27 LAB — MAGNESIUM: Magnesium: 1.7 mg/dL (ref 1.7–2.4)

## 2021-11-27 MED ORDER — OSMOLITE 1.5 CAL PO LIQD
237.0000 mL | Freq: Three times a day (TID) | ORAL | Status: DC
  Administered 2021-11-27 (×2): 237 mL

## 2021-11-27 NOTE — Progress Notes (Signed)
Patient ID: [name], male   DOB: 09/09/1960, 61 y.o.   MRN: [id number] S: No complaints.  No acute events overnight. Patient reports that he tolerated HD yesterday, net UF 2L. Remains anuric. O:BP 98/64 (BP Location: Left Arm)   Pulse 95   Temp 99.1 F (37.3 C) (Oral)   Resp (!) 22   Ht 5\' 7"  (1.702 m)   Wt 85.2 kg   SpO2 93%   BMI 29.42 kg/m   Intake/Output Summary (Last 24 hours) at 11/27/2021 0800 Last data filed at 11/26/2021 1204 Gross per 24 hour  Intake --  Output 2000 ml  Net -2000 ml   Intake/Output: I/O last 3 completed shifts: In: -  Out: 2000 [Other:2000]  Intake/Output this shift:  No intake/output data recorded. Weight change: 1.545 kg Gen: NAD, laying flat in bed CVS: RRR Resp:CTA Abd: distended, +BS, soft, PEG in place, no drainage Ext: no edema Neuro: awake/alert Dialysis access: [facility]  Recent Labs  Lab 11/21/21 0940 11/22/21 0046 11/23/21 1012 11/24/21 0027 11/25/21 0643 11/26/21 0013 11/27/21 0014  NA 134* 136 136 137 133* 133* 136  K 6.5* 4.5 4.7 3.9 3.6 4.2 4.1  CL 93* 94* 93* 95* 95* 93* 95*  CO2 21* 28 21* 27 26 27 29   GLUCOSE 90 84 86 82 93 115* 87  BUN 119* 46* 59* 23 34* 40* 22  CREATININE 7.87* 4.41* 6.70* 3.80* 5.65* 6.77* 4.51*  ALBUMIN 3.0* 2.9* 3.1* 3.1* 2.9* 2.9* 2.8*  CALCIUM 9.7 9.0 9.5 9.0 8.9 8.8* 8.5*  PHOS 6.2* 4.0 5.6* 3.6 4.9* 5.5* 3.3   Liver Function Tests: Recent Labs  Lab 11/25/21 0643 11/26/21 0013 11/27/21 0014  ALBUMIN 2.9* 2.9* 2.8*   No results for input(s): "LIPASE", "AMYLASE" in the last 168 hours. No results for input(s): "AMMONIA" in the last 168 hours. CBC: Recent Labs  Lab 11/23/21 1012 11/23/21 1350 11/24/21 0027 11/25/21 0643 11/26/21 0013  WBC 9.2 9.0 9.0 9.3 8.4  HGB 10.6* 10.0* 10.3* 10.3* 10.5*  HCT 35.5* 32.3* 34.9* 33.1* 34.0*  MCV 93.9 92.8 93.8 90.9 90.7  PLT 446* 416* 391 376 398   Cardiac Enzymes: No results for input(s): "CKTOTAL", "CKMB", "CKMBINDEX", "TROPONINI"  in the last 168 hours. CBG: Recent Labs  Lab 11/26/21 1808 11/26/21 1955 11/26/21 2345 11/27/21 0348 11/27/21 0739  GLUCAP 108* 117* 91 95 89    Iron Studies: No results for input(s): "IRON", "TIBC", "TRANSFERRIN", "FERRITIN" in the last 72 hours. Studies/Results: No results found.  aspirin  81 mg Oral Daily   atorvastatin  80 mg Oral Daily   Chlorhexidine Gluconate Cloth  6 each Topical Daily   darbepoetin (ARANESP) injection - DIALYSIS  100 mcg Intravenous Q Tue-HD   feeding supplement (OSMOLITE 1.5 CAL)  119 mL Per Tube QID   fentaNYL  1 patch Transdermal V76H   folic acid  1 mg Oral Daily   Gerhardt's butt cream   Topical Daily   heparin injection (subcutaneous)  5,000 Units Subcutaneous Q8H   insulin aspart  0-20 Units Subcutaneous Q4H   leptospermum manuka honey  1 Application Topical Daily   loperamide HCl  4 mg Oral TID   midodrine  10 mg Oral 3 times per day on Tue Thu Sat   multivitamin  1 tablet Oral QHS   mouth rinse  15 mL Mouth Rinse 4 times per day   pantoprazole  40 mg Oral BID   QUEtiapine  50 mg Oral QHS   sodium chloride  flush  3 mL Intravenous Q12H   thiamine  100 mg Oral Daily    BMET    Component Value Date/Time   NA 136 11/27/2021 0014   NA 135 02/13/2021 0855   K 4.1 11/27/2021 0014   CL 95 (L) 11/27/2021 0014   CO2 29 11/27/2021 0014   GLUCOSE 87 11/27/2021 0014   BUN 22 11/27/2021 0014   BUN 9 02/13/2021 0855   CREATININE 4.51 (H) 11/27/2021 0014   CALCIUM 8.5 (L) 11/27/2021 0014   GFRNONAA 14 (L) 11/27/2021 0014   GFRAA >60 08/25/2019 2110   CBC    Component Value Date/Time   WBC 8.4 11/26/2021 0013   RBC 3.75 (L) 11/26/2021 0013   HGB 10.5 (L) 11/26/2021 0013   HGB 15.4 02/13/2021 0855   HCT 34.0 (L) 11/26/2021 0013   HCT 45.7 02/13/2021 0855   PLT 398 11/26/2021 0013   PLT 239 02/13/2021 0855   MCV 90.7 11/26/2021 0013   MCV 88 02/13/2021 0855   MCH 28.0 11/26/2021 0013   MCHC 30.9 11/26/2021 0013   RDW 16.2 (H)  11/26/2021 0013   RDW 11.3 (L) 02/13/2021 0855   LYMPHSABS 2.2 09/29/2021 1956   MONOABS 2.1 (H) 09/29/2021 1956   EOSABS 0.0 09/29/2021 1956   BASOSABS 0.0 09/29/2021 1956    Assessment/Plan:   ESRD - following ischemic ATN in setting of cardiogenic shock/cardiac arrest.  Started CRRT on 10/07/21 and transitioned to IHD on 10/31/21.  s/p [facility] 11/04/21 with IR. Currently on TTS schedule w/ midodrine support.  Outpatient dialysis arrangements pending [facility] financial clearance. Will try for HD in recliner Cardiogenic shock/cardiac arrest - tamponade/hemorrhagic pericarditis, s/p pericardial drain and window.  s/p ECMO.  Midodrine being weaned: currently on midodrine 10 mg tid on TTS sched. AHRF- s/p trach 9/7, decannulated 9/25 Anemia of critical illness with ABLA - on ESA and IV iron. Hgb 10.3 today CAD - s/p CABG x 5 vessels on 09/12/21 F/E/N - s/p PEG placement under GA 11/20/21. Unstageable pressure ulcer of left buttock - wound care following.  Gean Quint, MD [facility]

## 2021-11-27 NOTE — Progress Notes (Signed)
Physical Therapy Treatment Patient Details Name: [name] MRN: [id number] DOB: 1960/12/23 Today's Date: 11/27/2021   History of Present Illness Pt is a 61 y.o. male admitted 09/28/21 with SOB, bradycardia, hypotension. Workup for bilateral pleural effusion, pericardial effusion s/p VATS with pericardial window 8/19. Cardiac arrest with tamponade 8/20, intubated with bedside mediastinal exploration, ECMO via fem access. Pt with mediastinal hemorrhage s/p reexploration 8/21. To OR 8/28 for washout, ECMO decannulation, Impella placement. CRRT initiated 8/28. Brief PEA arrest 8/29 with DCCV to NSR.  S/p chest closure 9/2. S/p trach and impella removed 9/7. S/p wound vac removal 9/11. Course complicated by bouts of hypotension and bradycardia. Off CRRT 9/19. Trach decannulated 9/25. Tunned HD cath placement 9/25; iHD initiated. PMH includes CAD (s/p recent CABG 09/12/21), CKD2, DM, HTN, AKI, afib, CVA.    PT Comments    Pt received seated EOB and agreeable to session. Pt continues to be limited by fatigue needing assist with chair follow throughout session as well as decreased cognition needing cues for safety and environmental awareness during gait for navigation of obstacles and into/out of bathroom. Pt demonstrating gait with Rw and min guard throughout with cues needed to widen BOS as pt with tendency to trip over L foot needing light assist to correct, pt needing x3 seated recovery breaks throughout ambulation secondary to fatigue. Pt continues to benefit from skilled PT services to progress toward functional mobility goals.    Recommendations for follow up therapy are one component of a multi-disciplinary discharge planning process, led by the attending physician.  Recommendations may be updated based on patient status, additional functional criteria and insurance authorization.  Follow Up Recommendations  Skilled nursing-short term rehab (<3 hours/day) Can patient physically be transported by  private vehicle: No   Assistance Recommended at Discharge Frequent or constant Supervision/Assistance  Patient can return home with the following A lot of help with bathing/dressing/bathroom;A lot of help with walking and/or transfers;Direct supervision/assist for financial management;Help with stairs or ramp for entrance;Direct supervision/assist for medications management;Assistance with cooking/housework;Assist for transportation   Equipment Recommendations  Wheelchair (measurements PT);Wheelchair cushion (measurements PT);Hospital bed    Recommendations for Other Services       Precautions / Restrictions Precautions Precautions: Sternal;Fall;Other (comment) Precaution Booklet Issued: No Precaution Comments: painful sacral/buttocks wound, sternal wound Required Braces or Orthoses: Splint/Cast Splint/Cast: intrinsic plus splint - nighttime only Splint/Cast - Date Prophylactic Dressing Applied (if applicable): 15/40/08 Restrictions Weight Bearing Restrictions: No Other Position/Activity Restrictions: initial CABG 09/12/21 with closure 9/2     Mobility  Bed Mobility Overal bed mobility: Needs Assistance Bed Mobility: Sidelying to Sit, Sit to Sidelying         Sit to sidelying: Mod assist General bed mobility comments: assist for BLEs    Transfers Overall transfer level: Needs assistance Equipment used: Rolling walker (2 wheels) Transfers: Sit to/from Stand, Bed to chair/wheelchair/BSC Sit to Stand: Mod assist           General transfer comment: patient required mod assist to stand from low commode and recliner throughout session    Ambulation/Gait Ambulation/Gait assistance: Min guard, Min assist Gait Distance (Feet): 90 Feet (+ 60 + 80) Assistive device: Rolling walker (2 wheels) Gait Pattern/deviations: Step-through pattern, Decreased stride length, Narrow base of support Gait velocity: decr     General Gait Details: min guard for safety, assist chair follow  as pt contineus to fatigue quickly needing to sit abruptly, decreased BOS needing cues throughout to widden, reliance on UE support, asssit for  navigation as pt drifting to L and unable to correct on own   Stairs             Wheelchair Mobility    Modified Rankin (Stroke Patients Only)       Balance Overall balance assessment: Needs assistance Sitting-balance support: Feet supported Sitting balance-Leahy Scale: Fair Sitting balance - Comments: seated on EOB upon entry   Standing balance support: Bilateral upper extremity supported Standing balance-Leahy Scale: Poor Standing balance comment: reliant on RW for support                            Cognition Arousal/Alertness: Awake/alert Behavior During Therapy: Anxious Overall Cognitive Status: Impaired/Different from baseline Area of Impairment: Attention, Following commands, Problem solving, Safety/judgement                 Orientation Level: Disoriented to, Time Current Attention Level: Selective Memory: Decreased short-term memory Following Commands: Follows one step commands consistently Safety/Judgement: Decreased awareness of safety, Decreased awareness of deficits Awareness: Emergent Problem Solving: Slow processing, Requires verbal cues          Exercises      General Comments General comments (skin integrity, edema, etc.): VSS on RA      Pertinent Vitals/Pain Pain Assessment Pain Assessment: Faces Faces Pain Scale: Hurts a little bit Pain Location: perineal wounds Pain Descriptors / Indicators: Sore Pain Intervention(s): Monitored during session, Limited activity within patient's tolerance, Repositioned    Home Living                          Prior Function            PT Goals (current goals can now be found in the care plan section) Acute Rehab PT Goals PT Goal Formulation: With patient Time For Goal Achievement: 11/29/21    Frequency    Min  3X/week      PT Plan Current plan remains appropriate    Co-evaluation              AM-PAC PT "6 Clicks" Mobility   Outcome Measure  Help needed turning from your back to your side while in a flat bed without using bedrails?: A Lot Help needed moving from lying on your back to sitting on the side of a flat bed without using bedrails?: A Lot Help needed moving to and from a bed to a chair (including a wheelchair)?: A Little Help needed standing up from a chair using your arms (e.g., wheelchair or bedside chair)?: A Little Help needed to walk in hospital room?: A Lot (chair follow) Help needed climbing 3-5 steps with a railing? : Total 6 Click Score: 13    End of Session   Activity Tolerance: Patient limited by fatigue Patient left: in bed;with call bell/phone within reach Nurse Communication: Mobility status PT Visit Diagnosis: Other abnormalities of gait and mobility (R26.89);Difficulty in walking, not elsewhere classified (R26.2);Muscle weakness (generalized) (M62.81)     Time: 5681-2751 PT Time Calculation (min) (ACUTE ONLY): 20 min  Charges:  $Gait Training: 8-22 mins                    Delania Ferg R. PTA Acute Rehabilitation Services Office: [place] 11/27/2021, 3:42 PM

## 2021-11-27 NOTE — Progress Notes (Signed)
Mobility Specialist Progress Note    11/27/21 1043  Mobility  Activity Ambulated with assistance in hallway  Level of Assistance +2 (takes two people) (chair follow)  Assistive Device Front wheel walker  Distance Ambulated (ft) 380 ft [phone])  Activity Response Tolerated well  Mobility Referral Yes  $Mobility charge 1 Mobility   Pre-Mobility: 95 HR Post-Mobility: 104 HR  Pt received in bed and agreeable. ModA to stand. Took x3 seated rest breaks. Returned to sitting EOB with call bell in reach.   Hildred Alamin Mobility Specialist  Secure Chat Only

## 2021-11-27 NOTE — Progress Notes (Signed)
Contacted by [facility] for an update on pt's possible start date. Clinic charge RN aware that d/c date is currently unknown and navigator will provide updates regarding possible date once known. Pt has been accepted at [facility] TTS 12:40 chair time. Pt will need to arrive at 11:50 for first appt to complete paperwork prior to treatment. Will assist as needed.   Melven Sartorius Renal Navigator [phone]

## 2021-11-27 NOTE — TOC Progression Note (Addendum)
Transition of Care [facility]) - Progression Note    Patient Details  Name: TAYLIN LEDER MRN: [id number] Date of Birth: [birth year]  Transition of Care [facility]) CM/SW [place], [place] Phone Number: 11/27/2021, 1:57 PM  Clinical Narrative:    [facility] can accept pt with medicaid pending and LOG.   CSW attempted to contact pt on room phone and cell phone listed in chart though unable to make contact with either. Attempted to coordinate with RN though pt informed RN that he does not feel like talking right now. CSW will follow up again later to update pt.   1415: CSW received call from pt's spouse. CSW provided update and that [facility] can offer a SNF bed. She is not satisfied with their medicare star rating. CSW explained multiple barriers to securing SNF bed primarily that pt does not have any health insurance. She inquires about CIR. CSW explains that CIR has declined pt and per their last screening seems to be due to laking 24/7 support post-discharge. Pt's spouse states that pt's step-mother is willing to stay with pt for 1 month post-discharge. Spouse states that step-mom is in her 81's but is healthy and would be able to physically assist pt at home. CSW explained that CIR has revisited this topic before though that he will notify CIR admissions of possible support from pt's step-mother.   Expected Discharge Plan: Skilled Nursing Facility Barriers to Discharge: Continued Medical Work up, No SNF bed, SNF Pending Medicaid  Expected Discharge Plan and Services Expected Discharge Plan: [place]                                               Social Determinants of Health (SDOH) Interventions    Readmission Risk Interventions    09/20/2021   11:57 AM  Readmission Risk Prevention Plan  Post Dischage Appt Complete  Medication Screening Complete  Transportation Screening Complete

## 2021-11-27 NOTE — Progress Notes (Addendum)
Brief Nutrition Note  RD spoke with pt at bedside. Pt reports that the feedings of half a carton of Osmolite 1.5 last night and this morning have gone well. Pt had just finished having a bowel movement on the bedside commode.  Pt is willing to increase the volume of his bolus feeds to 1 full carton (237 ml) today for 1200 feed and again for 1600 feed. He prefers not to receive a bolus this evening at 2000 because he has HD tomorrow morning and does not want to have to use the bathroom during HD. Pt reports that a friend is bringing him something to eat for dinner tonight.  RD will adjust tube feeding orders: - 237 ml (1 full carton) Osmolite 1.5 cal formula TID at 0800, 1200, and 1600 - Flush PEG with 20 ml free water before and after each bolus  Adjusted tube feeding regimen over 24 hours will provide 1065 kcal, 45 grams of protein, and 543 ml of H2O.   Total free water with flushes: 663 ml   RD assisted pt with breakfast meal. Pt completed 100% of sausage, 25% of eggs, 25% of potatoes, and 50% of Magic Cup. Pt also drank 50% of an orange juice and 50% of a sprite.  RD will check back in tomorrow and adjust tube feeding orders further at that time.   Gustavus Bryant, MS, RD, LDN Inpatient Clinical Dietitian Please see AMiON for contact information.

## 2021-11-27 NOTE — Progress Notes (Addendum)
Patient ID: [name], male   DOB: 1960-02-26, 62 y.o.   MRN: [id number]   Advanced Heart Failure Rounding Note   Subjective:    - 8/19 Pericardial window - 8/20 Cardiac arrest with tamponade -> Emergent bedside washout - 09/29/21 VA Cannulation - 09/30/21 Return to OR for mediastinal hemorrhage - 10/01/21 Developed AF -> amio - 10/02/21 TEE EF 25-30%  - 10/04/21 OR for washout. C/b continued bleeding overnight - 10/07/21 Placement of Impella 5.5 with washout, VA ECMO decannulation. Hypotensive with development of severe RV dysfunction after ECMO off and pressors titrated up. Multiple units of blood products. - 10/08/21 Brief PEA arrest. AFL with RVR >> S/p DCCV to SR, back in AFL shortly after - 8/31 Give 1UPRBCs  - 9/2 OR for chest closure - 9/3 Hypotensive overnight w/ SBPs in 80s. Febrile, mTemp 100.8. CRRT paused. VP increased to 0.04.  - 9/4 s/p bronchoscopy w/ BAL by PCCM, Cx NGTD  - 9/5 vomiting w/ large volume NGT output + watery/foul diarrhea, Tube feeds held. No signs of ileus on KUB. C-diff negative   - 9/7 OR for Impella Extraction and percutaneous tracheostomy. 2 u RBCs. - 9/8 CVVH stopped and line removed for holiday - 9/9 CVVH restarted.  -9/13 Worsening leukocytosis and pressor requirements. Purulent drainage from arterial line. Started Daptomycin, Micafungin and Meropenem. - 9/17 Daptomycin + Micafungin stopped. Remains on Meropenum  - 9/21 transitioned to iHD  - 9/25 Tunneled HD cath placed. Trach decannulation - 9/26 Hgb 6.1>>transfused 2uRBC  - 10/11 gtube placed  Yesterday, walked 35 feet.    No complaints. Denies SOB.    Objective:     Vital Signs:   Temp:  [97.5 F (36.4 C)-99.6 F (37.6 C)] 99.6 F (37.6 C) (10/18 0349) Pulse Rate:  [88-100] 95 (10/18 0349) Resp:  [0-30] 18 (10/18 0349) BP: (96-122)/(62-99) 114/62 (10/18 0349) SpO2:  [93 %-100 %] 93 % (10/18 0349) Weight:  [83.6 kg-85.2 kg] 85.2 kg (10/18 0435) Last BM Date : 11/26/21  Weight  change: Filed Weights   11/26/21 0732 11/26/21 1204 11/27/21 0435  Weight: 84.1 kg 83.6 kg 85.2 kg    Intake/Output:   Intake/Output Summary (Last 24 hours) at 11/27/2021 0700 Last data filed at 11/26/2021 1204 Gross per 24 hour  Intake --  Output 2000 ml  Net -2000 ml   General:   No resp difficulty HEENT: normal Neck: supple. no JVD. Carotids 2+ bilat; no bruits. No lymphadenopathy or thryomegaly appreciated. LIJ HD catheter Cor: PMI nondisplaced. Regular rate & rhythm. No rubs, gallops or murmurs. Lungs: clear Abdomen: soft, nontender, nondistended. No hepatosplenomegaly. No bruits or masses. Good bowel sounds. + G tube Extremities: no cyanosis, clubbing, rash, edema Neuro: alert & orientedx3, cranial nerves grossly intact. moves all 4 extremities w/o difficulty. Affect pleasant Skin: dressing on buttock    Telemetry: SR 80-90s   Labs: Basic Metabolic Panel: Recent Labs  Lab 11/23/21 1012 11/24/21 0027 11/25/21 0643 11/26/21 0013 11/27/21 0014  NA 136 137 133* 133* 136  K 4.7 3.9 3.6 4.2 4.1  CL 93* 95* 95* 93* 95*  CO2 21* _0 GLUCOSE 86 82 93 115* 87  BUN 59* 23 34* 40* 22  CREATININE 6.70* 3.80* 5.65* 6.77* 4.51*  CALCIUM 9.5 9.0 8.9 8.8* 8.5*  MG 1.9 1.7 1.7 1.8 1.7  PHOS 5.6* 3.6 4.9* 5.5* 3.3    Liver Function Tests: Recent Labs  Lab 11/23/21 1012 11/24/21 0027 11/25/21 2620 11/26/21 0013 11/27/21 0014  ALBUMIN 3.1* 3.1* 2.9* 2.9* 2.8*   No results for input(s): "LIPASE", "AMYLASE" in the last 168 hours. No results for input(s): "AMMONIA" in the last 168 hours.  CBC: Recent Labs  Lab 11/23/21 1012 11/23/21 1350 11/24/21 0027 11/25/21 0643 11/26/21 0013  WBC 9.2 9.0 9.0 9.3 8.4  HGB 10.6* 10.0* 10.3* 10.3* 10.5*  HCT 35.5* 32.3* 34.9* 33.1* 34.0*  MCV 93.9 92.8 93.8 90.9 90.7  PLT 446* 416* 391 376 398    Cardiac Enzymes: No results for input(s): "CKTOTAL", "CKMB", "CKMBINDEX", "TROPONINI" in the last 168  hours.   BNP: BNP (last 3 results) Recent Labs    12/24/20 0651 09/28/21 0405  BNP 412.0* 826.6*    ProBNP (last 3 results) No results for input(s): "PROBNP" in the last 8760 hours.    Other results:  Imaging: No results found.   Medications:     Scheduled Medications:  aspirin  81 mg Oral Daily   atorvastatin  80 mg Oral Daily   Chlorhexidine Gluconate Cloth  6 each Topical Daily   darbepoetin (ARANESP) injection - DIALYSIS  100 mcg Intravenous Q Tue-HD   feeding supplement (OSMOLITE 1.5 CAL)  119 mL Per Tube QID   fentaNYL  1 patch Transdermal E94M   folic acid  1 mg Oral Daily   Gerhardt's butt cream   Topical Daily   heparin injection (subcutaneous)  5,000 Units Subcutaneous Q8H   insulin aspart  0-20 Units Subcutaneous Q4H   leptospermum manuka honey  1 Application Topical Daily   loperamide HCl  4 mg Oral TID   midodrine  10 mg Oral 3 times per day on Tue Thu Sat   multivitamin  1 tablet Oral QHS   mouth rinse  15 mL Mouth Rinse 4 times per day   pantoprazole  40 mg Oral BID   QUEtiapine  50 mg Oral QHS   sodium chloride flush  3 mL Intravenous Q12H   thiamine  100 mg Oral Daily    Infusions:    PRN Medications: acetaminophen, camphor-menthol, clonazePAM, dextrose, diphenhydrAMINE, iohexol, ipratropium-albuterol, metoprolol tartrate, ondansetron (ZOFRAN) IV, mouth rinse, [EXPIRED] oxyCODONE **FOLLOWED BY** oxyCODONE, polyvinyl alcohol, sodium chloride flush, white petrolatum   Assessment/Plan:    1. Shock - mixed cardiogenic/hemorrhagic -> VA ECMO -> decannulated on 8/28 to Impella 5.5 - Echo 08/28: Underfilled LV, EF 55-60% with severe LVH and near normal RV systsystolic function.  - Impella extracted 9/7 - Bedside echo 09/13 - LV function preserved, RV mildly reduced. Small clot in posterior pericardium but no hemodynamic effect  - Completed total of 14 days of Micafungin (ended 9/27) - Off pressors.  - Stable on lower dose of midodrine on HD  days.    2. Cardiac arrest (PEA/bradycardic) - 8/19 and 8/28 in setting of tamponade - resolved  3. Cardiac tamponade with emergent bedside sternotomy  - Diffuse epicardial bleeding with post-op Dresslers - return to OR 8/21 and 8/24 for washouts.  - Washout 8/28 in OR. Multiple units of blood products in OR.   - Chest closed on 9/2  4. Acute hypoxemic respiratory failure - Off ECMO.  - Perc Trach placed 9/7  - s/p bronchoscopy w/ BAL 9/4.  - tracheal aspirate 09/13 growing few GPC and rare yeast  - Started back on IV abx as above d/t concern for sepsis.  -  Trach decannulated 9/25  - Resolved. On room air.   5. AKI due to ATN - CRRT started 08/28.  - Remains anuric - Transitioned  to [facility] 9/21. Nephrology following  - s/p [facility] cath by IR   - tolerating iHD (TTS schedule). At some point will need to go to HD in the chair.   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 Now off colchicine   7. 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 Continue ASA 81 mg daily  8. DM2 - continue SSI   9. PAF/AFL - Tolerates poorly.  - Recurrent AFL. S/p DCCV to SR 08/29.  - Off amio d/t bradycardia - Not on AC for now d/t bleeding, DVT dose heparin.  - IN SR  10. ID - Completed course of abx with open chest.   11. FEN - Underwent gastrostomy tube placement 10/11 - Modified Barium Swallow 10/13: now tolerating Dysphagia 3 diet.  - Getting bolus feeds.  - SLP + RD following.   12. Hyperkalemia - Resolved.   13. Unstageable Pressure Ulcer, Buttock  - WOC following.  - Started on hydrotherapy.   35. Fall - management per primary team  15. Diarrhea - mgmt per primary - Imodium, some improvement  Outpatient HD chair approved.  PT/OT following.Needs SNF for rehab at discharge but has no insurance. HF TOC CM assisting with disposition.   Amy Clegg NP-C  7:14 AM   Patient seen and examined with the above-signed Advanced Practice Provider and/or  Housestaff. I personally reviewed laboratory data, imaging studies and relevant notes. I independently examined the patient and formulated the important aspects of the plan. I have edited the note to reflect any of my changes or salient points. I have personally discussed the plan with the patient and/or family.  Tolerating HD on midodrine. BP still soft. Rhythm stable  General:  Sitting up. No resp difficulty HEENT: normal Neck: supple. no JVD. Carotids 2+ bilat; no bruits. No lymphadenopathy or thryomegaly appreciated. Cor: PMI nondisplaced. Regular rate & rhythm. No rubs, gallops or murmurs. Lungs: clear Abdomen: soft, nontender, nondistended. No hepatosplenomegaly. No bruits or masses. Good bowel sounds. _+ G-tube Extremities: no cyanosis, clubbing, rash, edema Neuro: alert & orientedx3, cranial nerves grossly intact. moves all 4 extremities w/o difficulty. Affect pleasant  Stable from cardiac perspective. Tolerating HD well. Ready for d/c to SNF from our standpoint. No new recs.   Glori Bickers, MD  9:11 AM

## 2021-11-27 NOTE — Progress Notes (Signed)
      [address]       [place] [address]             [phone]       7 Days Post-Op Procedure(s) (LRB): G-Tube Placement (N/A) Subjective: Patient is upset that he cannot get into CIR this morning. States his pain is much better today.  Objective: Vital signs in last 24 hours: Temp:  [97.5 F (36.4 C)-99.6 F (37.6 C)] 99.1 F (37.3 C) (10/18 0737) Pulse Rate:  [88-100] 95 (10/18 0349) Cardiac Rhythm: Normal sinus rhythm (10/18 0731) Resp:  [0-28] 22 (10/18 0737) BP: (96-119)/(62-99) 98/64 (10/18 0737) SpO2:  [93 %-98 %] 93 % (10/18 0349) Weight:  [83.6 kg-85.2 kg] 85.2 kg (10/18 0435)  Hemodynamic parameters for last 24 hours:    Intake/Output from previous day: 10/17 0701 - 10/18 0700 In: -  Out: 2000  Intake/Output this shift: No intake/output data recorded.  General appearance: alert, cooperative, and no distress Neurologic: intact Heart: regular rate and rhythm, S1, S2 normal, no murmur, click, rub or gallop Lungs: clear to auscultation bilaterally Abdomen: soft, non-tender; bowel sounds normal; no masses,  no organomegaly Extremities: extremities normal, atraumatic, no cyanosis or edema Wound: Clean, dry dressings on sternal and sacral wound. Sacral wound has good granulation tissue and no sign of infection   Lab Results: Recent Labs    11/25/21 0643 11/26/21 0013  WBC 9.3 8.4  HGB 10.3* 10.5*  HCT 33.1* 34.0*  PLT 376 398   BMET:  Recent Labs    11/26/21 0013 11/27/21 0014  NA 133* 136  K 4.2 4.1  CL 93* 95*  CO2 27 29  GLUCOSE 115* 87  BUN 40* 22  CREATININE 6.77* 4.51*  CALCIUM 8.8* 8.5*    PT/INR: No results for input(s): "LABPROT", "INR" in the last 72 hours. ABG    Component Value Date/Time   PHART 7.301 (L) 10/22/2021 2354   HCO3 19.3 (L) 10/22/2021 2354   TCO2 20 (L) 10/22/2021 2354   ACIDBASEDEF 7.0 (H) 10/22/2021 2354   O2SAT 71.7 11/02/2021 0439   CBG (last 3)  Recent Labs    11/26/21 2345  11/27/21 0348 11/27/21 0739  GLUCAP 91 95 89    Assessment/Plan: S/P Procedure(s) (LRB): G-Tube Placement (N/A)  CV: SBP 98 this AM, on midodrine 10mg  TID on HD days. NSR rate 98. Pulm: Saturating 93% on RA. Continue ambulation and IS.  GI: Dysphagia 3 diet. Taking PO and supplemental tube feeds. No diarrhea. Endo: CBGs 91/95/89 Renal: ESRD requiring HD. HD yesterday, tolerated well. ID/Wounds: Sternal and sacral wounds are healing. Started hydrotherapy on sacral wound. Continue dressing changes. No sign of infection. Dispo: Turned down by CIR due to no caregiver support at home. SNF recommended, no insurance and pt states he does not want to go to SNF because he thinks they will not take care of him.    LOS: 60 days    Magdalene River, PA-C 11/27/2021

## 2021-11-27 NOTE — Progress Notes (Signed)
OT impression: Pt seen for splint check. Despite education, pt stated he didn't know he was supposed to wear the splint last night. Re-educated the splint is for night time use to maintain joint integrity in a functional position. Donned splint, made minor adjustments and asked pt to wear for 2 hours. Will return later today to re-check for any skin concerns. Splint schedule hanging above pt's bed. POC remains appropriate.    11/27/21 0900  General Comments  General comments (skin integrity, edema, etc.) VSS on RA. Session focused on splint check and adjustments made  OT - End of Session  Activity Tolerance Patient tolerated treatment well  Patient left in bed  Nurse Communication Mobility status (splint schedule)  OT Assessment/Plan  OT Plan Discharge plan remains appropriate  OT Visit Diagnosis Unsteadiness on feet (R26.81);Other abnormalities of gait and mobility (R26.89);Muscle weakness (generalized) (M62.81);Other symptoms and signs involving cognitive function;Pain  Pain - Right/Left Right  Pain - part of body Hand  OT Frequency (ACUTE ONLY) Min 2X/week  Follow Up Recommendations Skilled nursing-short term rehab (<3 hours/day)  Assistance recommended at discharge Frequent or constant Supervision/Assistance  Patient can return home with the following A lot of help with walking and/or transfers;A lot of help with bathing/dressing/bathroom;Assistance with feeding;Assistance with cooking/housework;Direct supervision/assist for medications management;Direct supervision/assist for financial management;Assist for transportation;Help with stairs or ramp for entrance  OT Equipment [id number]  AM-PAC OT "6 Clicks" Daily Activity Outcome Measure (Version 2)  Help from another person eating meals? 3  Help from another person taking care of personal grooming? 3  Help from another person toileting, which includes using toliet, bedpan, or urinal? 2  Help from another person bathing (including washing,  rinsing, drying)? 2  Help from another person to put on and taking off regular upper body clothing? 2  Help from another person to put on and taking off regular lower body clothing? 2  6 Click Score 14  Progressive Mobility  What is the highest level of mobility based on the progressive mobility assessment? Level 5 (Walks with assist in room/hall) - Balance while stepping forward/back and can walk in room with assist - Complete  Mobility Referral Yes  OT Goal Progression  Progress towards OT goals Progressing toward goals  Acute Rehab OT Goals  Patient Stated Goal to get better  OT Goal Formulation With patient  Time For Goal Achievement 12/06/21  Potential to Achieve Goals Good  ADL Goals  Pt Will Perform Grooming with set-up;sitting  Pt Will Perform Upper Body Bathing with set-up;sitting  Pt/caregiver will Perform Home Exercise Program Increased strength;Increased ROM;Both right and left upper extremity;With minimal assist  Additional ADL Goal #1 Pt will demosntrate sustained attention for grooming task in nondistracting environment  Additional ADL Goal #2 Pt will complete bed mobility with mod A in preparation for ADL tasks  Additional ADL Goal #3 Pt will grasp/release objects using functional pinch pattern with necessary AE/ splinting to increase functional use R hand  Pt Will Perform Upper Body Dressing with min assist;sitting  Pt Will Transfer to Toilet with min guard assist;stand pivot transfer;bedside commode  Pt Will Perform Toileting - Clothing Manipulation and hygiene with min assist;sit to/from stand;sitting/lateral leans  OT Time Calculation  OT Start Time (ACUTE ONLY) 0831  OT Stop Time (ACUTE ONLY) 0853  OT Time Calculation (min) 22 min  OT General Charges  $OT Visit 1 Visit  OT Treatments  $Orthotics/Prosthetics Check 8-22 mins

## 2021-11-27 NOTE — Progress Notes (Signed)
Speech Language Pathology Treatment: Dysphagia  Patient Details Name: [name] MRN: [id number] DOB: 1960-08-20 Today's Date: 11/27/2021 Time: 5366-4403 SLP Time Calculation (min) (ACUTE ONLY): 25 min  Assessment / Plan / Recommendation Clinical Impression  [name] was in good spirits this afternoon. He sat himself up to EOB asking for assist, but did so independently.  He self fed crackers with peanut butter and thin liquids with only initial prompt needed to attend to pocketing/oral residue.  We discussed benefit of advancing diet to regular solids. Reached out to K. Holmes, RD, who confirmed it was fine to start a regular diet - she will make adjustments if needed.   Will advance diet to regular/thin liquids; meds whole in liquids. Pt should continue to attend to oral residue when eating.  He will need assistance with tray set-up before each meal.  [name] was very pleased.   HPI [name], bradycardia and hypotension. Pt with bil pleural effusion and pericardial effusion s/p VATS with pericardial window 8/19. 8/20 cardiac tamponade with arrest,  intubated with bedside mediastinal exploration and ECMO via fem access. OR 8/21 for reexploration due to mediastinal hemorrhage. 8/28 OR for washout with ECMO decannulation and Impella placed. 8/29 brief PEA arrest with DCCV to NSR. 9/2 chest closure. 9/7 trach and Impella removed.9/11 VAC removed. Decannulated 9/26. PMHx: CAD s/p recent CABG x5 (admitted  8/3-11/23 with post op Afib), CKD stage II, DM, HTN, AKI, Afib and CVA      SLP Plan  Continue with current plan of care      Recommendations for follow up therapy are one component of a multi-disciplinary discharge planning process, led by the attending physician.  Recommendations may be updated based on patient status, additional functional criteria and insurance authorization.    Recommendations  Diet recommendations: Regular;Thin liquid Liquids provided  via: Cup;Straw Medication Administration: Whole meds with liquid Supervision: Intermittent supervision to cue for compensatory strategies (set up assist for each meal) Compensations: Minimize environmental distractions;Lingual sweep for clearance of pocketing Postural Changes and/or Swallow Maneuvers: Seated upright 90 degrees                Oral Care Recommendations: Oral care BID Assistance recommended at discharge: Frequent or constant Supervision/Assistance SLP Visit Diagnosis: Dysphagia, oropharyngeal phase (R13.12) Plan: Continue with current plan of care         [name] L. Tivis Ringer, MA CCC/SLP Clinical Specialist - Acute Care SLP Acute Rehabilitation Services Office number [phone]   [name]  11/27/2021, 2:34 PM

## 2021-11-27 NOTE — TOC Progression Note (Signed)
Transition of Care [facility]) - Progression Note    Patient Details  Name: [name] MRN: [id number] Date of Birth: 1960-11-24  Transition of Care [facility]) CM/SW [place], [place] Phone Number: 11/27/2021, 11:46 AM  Clinical Narrative:    CSW spoke with pt per request. Pt shared that he is currently waiting to see if he is able to stay with a family member at DC bc he wants to go to CIR and not SNF.  Pt stated that he has been working with PT and states that he would like for a referral be sent to CIR once he confirms DC plan.   Expected Discharge Plan: Skilled Nursing Facility Barriers to Discharge: Continued Medical Work up, No SNF bed, SNF Pending Medicaid  Expected Discharge Plan and Services Expected Discharge Plan: [place]                                               Social Determinants of Health (SDOH) Interventions    Readmission Risk Interventions    09/20/2021   11:57 AM  Readmission Risk Prevention Plan  Post Dischage Appt Complete  Medication Screening Complete  Transportation Screening Complete

## 2021-11-27 NOTE — Progress Notes (Signed)
Inpatient Rehabilitation Admissions Coordinator    Notified by SLP, Estill Bamberg that patient may have caregivers supports. I will place rehab consult and an [facility] will make full assessment of rehab options.,  Danne Baxter, RN, MSN Rehab Admissions Coordinator [phone] 11/27/2021 3:17 PM

## 2021-11-28 LAB — GLUCOSE, CAPILLARY
Glucose-Capillary: 95 mg/dL (ref 70–99)
Glucose-Capillary: 93 mg/dL (ref 70–99)
Glucose-Capillary: 94 mg/dL (ref 70–99)
Glucose-Capillary: 183 mg/dL — ABNORMAL HIGH (ref 70–99)

## 2021-11-28 LAB — CBC
HCT: 33.9 % — ABNORMAL LOW (ref 39.0–52.0)
Hemoglobin: 10.4 g/dL — ABNORMAL LOW (ref 13.0–17.0)
MCH: 27.7 pg (ref 26.0–34.0)
MCHC: 30.7 g/dL (ref 30.0–36.0)
MCV: 90.4 fL (ref 80.0–100.0)
Platelets: 360 10*3/uL (ref 150–400)
RBC: 3.75 MIL/uL — ABNORMAL LOW (ref 4.22–5.81)
RDW: 16.2 % — ABNORMAL HIGH (ref 11.5–15.5)
WBC: 9.5 10*3/uL (ref 4.0–10.5)
nRBC: 0 % (ref 0.0–0.2)

## 2021-11-28 LAB — RENAL FUNCTION PANEL
Albumin: 2.8 g/dL — ABNORMAL LOW (ref 3.5–5.0)
Anion gap: 12 (ref 5–15)
BUN: 34 mg/dL — ABNORMAL HIGH (ref 8–23)
CO2: 27 mmol/L (ref 22–32)
Calcium: 8.9 mg/dL (ref 8.9–10.3)
Chloride: 96 mmol/L — ABNORMAL LOW (ref 98–111)
Creatinine, Ser: 6.08 mg/dL — ABNORMAL HIGH (ref 0.61–1.24)
GFR, Estimated: 10 mL/min — ABNORMAL LOW (ref >60)
Glucose, Bld: 95 mg/dL (ref 70–99)
Phosphorus: 3.1 mg/dL (ref 2.5–4.6)
Potassium: 4.7 mmol/L (ref 3.5–5.1)
Sodium: 135 mmol/L (ref 135–145)

## 2021-11-28 LAB — MAGNESIUM: Magnesium: 1.8 mg/dL (ref 1.7–2.4)

## 2021-11-28 MED ORDER — OSMOLITE 1.5 CAL PO LIQD
237.0000 mL | Freq: Four times a day (QID) | ORAL | Status: DC
  Administered 2021-11-28: 237 mL

## 2021-11-28 MED ORDER — HEPARIN SODIUM (PORCINE) 1000 UNIT/ML IJ SOLN
INTRAMUSCULAR | Status: AC
  Filled 2021-11-28: qty 4

## 2021-11-28 NOTE — Progress Notes (Addendum)
      [address]       [place] [address]             [phone]      8 Days Post-Op Procedure(s) (LRB): G-Tube Placement (N/A) Subjective: Patient is feeling better today, no new complaints.  Objective: Vital signs in last 24 hours: Temp:  [97.9 F (36.6 C)-99.1 F (37.3 C)] 98.7 F (37.1 C) (10/19 0423) Pulse Rate:  [94-103] 94 (10/19 0423) Cardiac Rhythm: Normal sinus rhythm;Bundle branch block (10/19 0718) Resp:  [18-20] 19 (10/19 0423) BP: (109-132)/(64-94) 132/79 (10/19 0423) SpO2:  [95 %-97 %] 97 % (10/19 0423) Weight:  [86.2 kg] 86.2 kg (10/19 0500)  Hemodynamic parameters for last 24 hours:    Intake/Output from previous day: 10/18 0701 - 10/19 0700 In: 960 [P.O.:960] Out: -  Intake/Output this shift: No intake/output data recorded.  General appearance: alert, cooperative, and no distress Neurologic: intact Heart: regular rate and rhythm, S1, S2 normal, no murmur, click, rub or gallop Lungs: clear to auscultation bilaterally Abdomen: soft, non-tender; bowel sounds normal; no masses,  no organomegaly Extremities: extremities normal, atraumatic, no cyanosis or edema Wound: Sternal and sacral wound dressings clean and dry  Lab Results: Recent Labs    11/26/21 0013 11/28/21 0021  WBC 8.4 9.5  HGB 10.5* 10.4*  HCT 34.0* 33.9*  PLT 398 360   BMET:  Recent Labs    11/27/21 0014 11/28/21 0021  NA 136 135  K 4.1 4.7  CL 95* 96*  CO2 29 27  GLUCOSE 87 95  BUN 22 34*  CREATININE 4.51* 6.08*  CALCIUM 8.5* 8.9    PT/INR: No results for input(s): "LABPROT", "INR" in the last 72 hours. ABG    Component Value Date/Time   PHART 7.301 (L) 10/22/2021 2354   HCO3 19.3 (L) 10/22/2021 2354   TCO2 20 (L) 10/22/2021 2354   ACIDBASEDEF 7.0 (H) 10/22/2021 2354   O2SAT 71.7 11/02/2021 0439   CBG (last 3)  Recent Labs    11/27/21 1957 11/27/21 2348 11/28/21 0422  GLUCAP 145* 107* 95    Assessment/Plan: S/P Procedure(s)  (LRB): G-Tube Placement (N/A)  CV: NSR, HR 96. SBP 132. On midodrine 10mg  TID on HD days, tolerating well. Pulm: Saturating 97% on RA.  GI: Advanced to regular diet and supplemental tube feeds. Tolerating well. No diarrhea, on imodium.  Endo: T2DM, ok control. CBGs 145/107/95. Renal: ESRD requiring dialysis. HD today. ID: Sternal wound clean and dry dressing. Started hydrotherapy on sacral wound. Clean and dry dressing, no sign of infection.  Dispo: CIR agreeable to reassess patient as his mother can help at home. [facility] SNF has offered him a bed but he refused due to wanting to go to CIR. Still awaiting insurance.   LOS: 61 days    Magdalene River, PA-C 11/28/2021   Chart reviewed, patient examined, agree with above. He feels well overall. Says he is eating better and getting some osmolyte bolus feedings per tube. He would like to go to CIR if possible because he feels like he would progress much quicker with more intensive therapy. I think he could go there any time if accepted.

## 2021-11-28 NOTE — Progress Notes (Signed)
Received patient in bed to unit.  Alert and oriented.  Informed consent signed and in chart.   Treatment initiated: 6213 Treatment completed: 1230  Patient tolerated well.  Transported back to the room  Alert, without acute distress.  Hand-off given to patient's nurse.   Access used: Cath Access issues: none  Total UF removed: 1.3L Medication(s) given: n/a Post HD VS: 97.9,101,24,139/84 Post HD weight: 84.8   [facility]

## 2021-11-28 NOTE — Progress Notes (Addendum)
Mobility Specialist Progress Note    11/28/21 1601  Mobility  Activity Ambulated with assistance to bathroom  Level of Assistance Minimal assist, patient does 75% or more  Assistive Device Front wheel walker  Distance Ambulated (ft) 20 ft (10+10)  Activity Response Tolerated well  Mobility Referral Yes  $Mobility charge 1 Mobility   Post-Mobility: 100 HR  Pt received and agreeable. No complaints. Had BM. ModA +1 to stand. Returned to bed with call bell in reach.    Hildred Alamin Mobility Specialist  Secure Chat Only

## 2021-11-28 NOTE — Progress Notes (Signed)
Mobility Specialist Progress Note    11/28/21 1431  Mobility  Activity Ambulated with assistance in hallway  Level of Assistance +2 (takes two people) (chair follow)  Assistive Device Front wheel walker  Distance Ambulated (ft) 105 ft (80+25)  Activity Response Tolerated well  Mobility Referral Yes  $Mobility charge 1 Mobility   Pre-Mobility: 104 HR Post-Mobility: 100 HR  Pt received sitting EOB and agreeable. C/o fatigue from HD. Took x1 seated rest break. Returned to bed with call bell in reach.    Hildred Alamin Mobility Specialist  Secure Chat Only

## 2021-11-28 NOTE — Progress Notes (Signed)
Brief Nutrition Note  RD spoke with pt at bedside. Pt reports that the feedings of a full carton of Osmolite 1.5 yesterday went well. Spoke with RN who states pt reported frustration with the length of time it took to receive a bolus. Pt has not received any tube feeds so far today due to being in HD.  Pt is willing to receive 1 full carton (237 ml) today for 1600 feed and again for 2000 feed.  RD will adjust tube feeding orders: - 237 ml (1 full carton) Osmolite 1.5 cal formula TID at 0800, 1200, 1600, and 2000 - Flush PEG with 20 ml free water before and after each bolus  Adjusted tube feeding regimen over 24 hours will provide 1420 kcal, 60 grams of protein, and 724 ml of H2O.   Total free water with flushes: 884 ml   RD will check back in tomorrow and adjust tube feeding orders further at that time.   Gustavus Bryant, MS, RD, LDN Inpatient Clinical Dietitian Please see AMiON for contact information.

## 2021-11-28 NOTE — Progress Notes (Addendum)
  Inpatient Rehabilitation Admissions Coordinator   Met with patient  at bedside for rehab assessment. We discussed goals and expectations of a possible CIR admit. He prefers CIR for rehab. I will contact his ex wife, [name], step mom, [name] and Barbaraann Rondo Ron to confirm discharge disposition and caregiver supports after a CIR admit of likely 10 to 12 days or less. Once confirmed I will discuss with Dr Naaman Plummer for possible CIR admit in next 23 to 48 hrs if approved by Rehab MD. He is in agreement. Please call me with any questions.   Danne Baxter, RN, MSN Rehab Admissions Coordinator [phone]  I spoke with ex wife, [name], by phone and reviewed the above information. She will contact patient, [name] and Owens Shark to clarify best options for his rehab and caregiver supports.  Danne Baxter, RN, MSN Rehab Admissions Coordinator [phone] 11/28/2021 4:02 PM

## 2021-11-28 NOTE — Progress Notes (Signed)
Patient ID: [name], male   DOB: March 18, 1960, 61 y.o.   MRN: [id number] S: No complaints.  No acute events overnight. He reports that he contemplating between SNF and CIR. Pending transport to HD this AM. O:BP 132/79 (BP Location: Left Arm)   Pulse 94   Temp 98.7 F (37.1 C) (Oral)   Resp 19   Ht 5\' 7"  (1.702 m)   Wt 86.2 kg   SpO2 97%   BMI 29.76 kg/m   Intake/Output Summary (Last 24 hours) at 11/28/2021 0834 Last data filed at 11/27/2021 2007 Gross per 24 hour  Intake 960 ml  Output --  Net 960 ml   Intake/Output: I/O last 3 completed shifts: In: 960 [P.O.:960] Out: -   Intake/Output this shift:  No intake/output data recorded. Weight change: 2.1 kg Gen: NAD, laying flat in bed CVS: RRR Resp:CTA Abd: soft, PEG in place, no drainage Ext: no edema Neuro: awake/alert Dialysis access: [facility]  Recent Labs  Lab 11/22/21 0046 11/23/21 1012 11/24/21 0027 11/25/21 0643 11/26/21 0013 11/27/21 0014 11/28/21 0021  NA 136 136 137 133* 133* 136 135  K 4.5 4.7 3.9 3.6 4.2 4.1 4.7  CL 94* 93* 95* 95* 93* 95* 96*  CO2 28 21* 27 26 27 29 27   GLUCOSE 84 86 82 93 115* 87 95  BUN 46* 59* 23 34* 40* 22 34*  CREATININE 4.41* 6.70* 3.80* 5.65* 6.77* 4.51* 6.08*  ALBUMIN 2.9* 3.1* 3.1* 2.9* 2.9* 2.8* 2.8*  CALCIUM 9.0 9.5 9.0 8.9 8.8* 8.5* 8.9  PHOS 4.0 5.6* 3.6 4.9* 5.5* 3.3 3.1   Liver Function Tests: Recent Labs  Lab 11/26/21 0013 11/27/21 0014 11/28/21 0021  ALBUMIN 2.9* 2.8* 2.8*   No results for input(s): "LIPASE", "AMYLASE" in the last 168 hours. No results for input(s): "AMMONIA" in the last 168 hours. CBC: Recent Labs  Lab 11/23/21 1350 11/24/21 0027 11/25/21 0643 11/26/21 0013 11/28/21 0021  WBC 9.0 9.0 9.3 8.4 9.5  HGB 10.0* 10.3* 10.3* 10.5* 10.4*  HCT 32.3* 34.9* 33.1* 34.0* 33.9*  MCV 92.8 93.8 90.9 90.7 90.4  PLT 416* 391 376 398 360   Cardiac Enzymes: No results for input(s): "CKTOTAL", "CKMB", "CKMBINDEX", "TROPONINI" in the last 168  hours. CBG: Recent Labs  Lab 11/27/21 1123 11/27/21 1642 11/27/21 1957 11/27/21 2348 11/28/21 0422  GLUCAP 120* 99 145* 107* 95    Iron Studies: No results for input(s): "IRON", "TIBC", "TRANSFERRIN", "FERRITIN" in the last 72 hours. Studies/Results: No results found.  aspirin  81 mg Oral Daily   atorvastatin  80 mg Oral Daily   Chlorhexidine Gluconate Cloth  6 each Topical Daily   darbepoetin (ARANESP) injection - DIALYSIS  100 mcg Intravenous Q Tue-HD   feeding supplement (OSMOLITE 1.5 CAL)  237 mL Per Tube TID   fentaNYL  1 patch Transdermal O84Z   folic acid  1 mg Oral Daily   Gerhardt's butt cream   Topical Daily   heparin injection (subcutaneous)  5,000 Units Subcutaneous Q8H   insulin aspart  0-20 Units Subcutaneous Q4H   leptospermum manuka honey  1 Application Topical Daily   loperamide HCl  4 mg Oral TID   midodrine  10 mg Oral 3 times per day on Tue Thu Sat   multivitamin  1 tablet Oral QHS   mouth rinse  15 mL Mouth Rinse 4 times per day   pantoprazole  40 mg Oral BID   QUEtiapine  50 mg Oral QHS   sodium chloride  flush  3 mL Intravenous Q12H   thiamine  100 mg Oral Daily    BMET    Component Value Date/Time   NA 135 11/28/2021 0021   NA 135 02/13/2021 0855   K 4.7 11/28/2021 0021   CL 96 (L) 11/28/2021 0021   CO2 27 11/28/2021 0021   GLUCOSE 95 11/28/2021 0021   BUN 34 (H) 11/28/2021 0021   BUN 9 02/13/2021 0855   CREATININE 6.08 (H) 11/28/2021 0021   CALCIUM 8.9 11/28/2021 0021   GFRNONAA 10 (L) 11/28/2021 0021   GFRAA >60 08/25/2019 2110   CBC    Component Value Date/Time   WBC 9.5 11/28/2021 0021   RBC 3.75 (L) 11/28/2021 0021   HGB 10.4 (L) 11/28/2021 0021   HGB 15.4 02/13/2021 0855   HCT 33.9 (L) 11/28/2021 0021   HCT 45.7 02/13/2021 0855   PLT 360 11/28/2021 0021   PLT 239 02/13/2021 0855   MCV 90.4 11/28/2021 0021   MCV 88 02/13/2021 0855   MCH 27.7 11/28/2021 0021   MCHC 30.7 11/28/2021 0021   RDW 16.2 (H) 11/28/2021 0021   RDW  11.3 (L) 02/13/2021 0855   LYMPHSABS 2.2 09/29/2021 1956   MONOABS 2.1 (H) 09/29/2021 1956   EOSABS 0.0 09/29/2021 1956   BASOSABS 0.0 09/29/2021 1956    Assessment/Plan:   ESRD - following ischemic ATN in setting of cardiogenic shock/cardiac arrest.  Started CRRT on 10/07/21 and transitioned to IHD on 10/31/21.  s/p [facility] 11/04/21 with IR. Currently on TTS schedule w/ midodrine support.  Financially cleared to start at [facility] TTS however dispo is still pending. Will try for HD in recliner Cardiogenic shock/cardiac arrest - tamponade/hemorrhagic pericarditis, s/p pericardial drain and window.  s/p ECMO.  Midodrine being weaned: currently on midodrine 10 mg tid on TTS sched. AHRF- s/p trach 9/7, decannulated 9/25 Anemia of critical illness with ABLA - on ESA and IV iron. Hgb 10.3 today CAD - s/p CABG x 5 vessels on 09/12/21 F/E/N - s/p PEG placement under GA 11/20/21. Unstageable pressure ulcer of left buttock - wound care following.  Gean Quint, MD [facility]

## 2021-11-28 NOTE — Progress Notes (Signed)
Inpatient Rehabilitation Admissions Coordinator   Met with patient in hemodialysis. I will return for Rehab consult once patient returns to his room after dialysis today for full assessment.  Danne Baxter, RN, MSN Rehab Admissions Coordinator [phone] 11/28/2021 10:31 AM

## 2021-11-28 NOTE — Progress Notes (Addendum)
Patient ID: [name], male   DOB: 1960-12-05, 61 y.o.   MRN: [id number]   Advanced Heart Failure Rounding Note   Subjective:    - 8/19 Pericardial window - 8/20 Cardiac arrest with tamponade -> Emergent bedside washout - 09/29/21 VA Cannulation - 09/30/21 Return to OR for mediastinal hemorrhage - 10/01/21 Developed AF -> amio - 10/02/21 TEE EF 25-30%  - 10/04/21 OR for washout. C/b continued bleeding overnight - 10/07/21 Placement of Impella 5.5 with washout, VA ECMO decannulation. Hypotensive with development of severe RV dysfunction after ECMO off and pressors titrated up. Multiple units of blood products. - 10/08/21 Brief PEA arrest. AFL with RVR >> S/p DCCV to SR, back in AFL shortly after - 8/31 Give 1UPRBCs  - 9/2 OR for chest closure - 9/3 Hypotensive overnight w/ SBPs in 80s. Febrile, mTemp 100.8. CRRT paused. VP increased to 0.04.  - 9/4 s/p bronchoscopy w/ BAL by PCCM, Cx NGTD  - 9/5 vomiting w/ large volume NGT output + watery/foul diarrhea, Tube feeds held. No signs of ileus on KUB. C-diff negative   - 9/7 OR for Impella Extraction and percutaneous tracheostomy. 2 u RBCs. - 9/8 CVVH stopped and line removed for holiday - 9/9 CVVH restarted.  -9/13 Worsening leukocytosis and pressor requirements. Purulent drainage from arterial line. Started Daptomycin, Micafungin and Meropenem. - 9/17 Daptomycin + Micafungin stopped. Remains on Meropenum  - 9/21 transitioned to iHD  - 9/25 Tunneled HD cath placed. Trach decannulation - 9/26 Hgb 6.1>>transfused 2uRBC  - 10/11 gtube placed  Ambulated twice yesterday. Tolerating hydrotherapy.   Seen in HD, sitting up in chair. No complaints. Denies SOB.    Objective:     Vital Signs:   Temp:  [97.9 F (36.6 C)-99.1 F (37.3 C)] 98.7 F (37.1 C) (10/19 0423) Pulse Rate:  [94-103] 94 (10/19 0423) Resp:  [18-20] 19 (10/19 0423) BP: (109-132)/(64-94) 132/79 (10/19 0423) SpO2:  [95 %-97 %] 97 % (10/19 0423) Weight:  [86.2 kg] 86.2 kg  (10/19 0500) Last BM Date : 11/26/21  Weight change: Filed Weights   11/26/21 1204 11/27/21 0435 11/28/21 0500  Weight: 83.6 kg 85.2 kg 86.2 kg    Intake/Output:   Intake/Output Summary (Last 24 hours) at 11/28/2021 0827 Last data filed at 11/27/2021 2007 Gross per 24 hour  Intake 960 ml  Output --  Net 960 ml    General:  well appearing.  No respiratory difficulty HEENT: normal Neck: supple. No JVD. Carotids 2+ bilat; no bruits. No lymphadenopathy or thyromegaly appreciated. LIJ HD cath. Cor: PMI nondisplaced. Regular rate & rhythm. No rubs, gallops or murmurs. Lungs: clear Abdomen: soft, nontender, nondistended. No hepatosplenomegaly. No bruits or masses. Good bowel sounds. Extremities: no cyanosis, clubbing, rash, edema  Neuro: alert & oriented x 3, cranial nerves grossly intact. moves all 4 extremities w/o difficulty. Affect pleasant.   Telemetry: NSR 90s (Personally reviewed)    Labs: Basic Metabolic Panel: Recent Labs  Lab 11/24/21 0027 11/25/21 0643 11/26/21 0013 11/27/21 0014 11/28/21 0021  NA 137 133* 133* 136 135  K 3.9 3.6 4.2 4.1 4.7  CL 95* 95* 93* 95* 96*  CO2 _0 GLUCOSE 82 93 115* 87 95  BUN 23 34* 40* 22 34*  CREATININE 3.80* 5.65* 6.77* 4.51* 6.08*  CALCIUM 9.0 8.9 8.8* 8.5* 8.9  MG 1.7 1.7 1.8 1.7 1.8  PHOS 3.6 4.9* 5.5* 3.3 3.1    Liver Function Tests: Recent Labs  Lab 11/24/21 0027 11/25/21  0175 11/26/21 0013 11/27/21 0014 11/28/21 0021  ALBUMIN 3.1* 2.9* 2.9* 2.8* 2.8*   No results for input(s): "LIPASE", "AMYLASE" in the last 168 hours. No results for input(s): "AMMONIA" in the last 168 hours.  CBC: Recent Labs  Lab 11/23/21 1350 11/24/21 0027 11/25/21 0643 11/26/21 0013 11/28/21 0021  WBC 9.0 9.0 9.3 8.4 9.5  HGB 10.0* 10.3* 10.3* 10.5* 10.4*  HCT 32.3* 34.9* 33.1* 34.0* 33.9*  MCV 92.8 93.8 90.9 90.7 90.4  PLT 416* 391 376 398 360    Cardiac Enzymes: No results for input(s): "CKTOTAL", "CKMB",  "CKMBINDEX", "TROPONINI" in the last 168 hours.   BNP: BNP (last 3 results) Recent Labs    12/24/20 0651 09/28/21 0405  BNP 412.0* 826.6*    ProBNP (last 3 results) No results for input(s): "PROBNP" in the last 8760 hours.  Other results:  Imaging: No results found.   Medications:     Scheduled Medications:  aspirin  81 mg Oral Daily   atorvastatin  80 mg Oral Daily   Chlorhexidine Gluconate Cloth  6 each Topical Daily   darbepoetin (ARANESP) injection - DIALYSIS  100 mcg Intravenous Q Tue-HD   feeding supplement (OSMOLITE 1.5 CAL)  237 mL Per Tube TID   fentaNYL  1 patch Transdermal Z02H   folic acid  1 mg Oral Daily   Gerhardt's butt cream   Topical Daily   heparin injection (subcutaneous)  5,000 Units Subcutaneous Q8H   insulin aspart  0-20 Units Subcutaneous Q4H   leptospermum manuka honey  1 Application Topical Daily   loperamide HCl  4 mg Oral TID   midodrine  10 mg Oral 3 times per day on Tue Thu Sat   multivitamin  1 tablet Oral QHS   mouth rinse  15 mL Mouth Rinse 4 times per day   pantoprazole  40 mg Oral BID   QUEtiapine  50 mg Oral QHS   sodium chloride flush  3 mL Intravenous Q12H   thiamine  100 mg Oral Daily    Infusions:  PRN Medications: acetaminophen, camphor-menthol, clonazePAM, dextrose, diphenhydrAMINE, iohexol, ipratropium-albuterol, metoprolol tartrate, ondansetron (ZOFRAN) IV, mouth rinse, [EXPIRED] oxyCODONE **FOLLOWED BY** oxyCODONE, polyvinyl alcohol, sodium chloride flush, white petrolatum   Assessment/Plan:    1. Shock - mixed cardiogenic/hemorrhagic -> VA ECMO -> decannulated on 8/28 to Impella 5.5 - Echo 08/28: Underfilled LV, EF 55-60% with severe LVH and near normal RV systsystolic function.  - Impella extracted 9/7 - Bedside echo 09/13 - LV function preserved, RV mildly reduced. Small clot in posterior pericardium but no hemodynamic effect  - Completed total of 14 days of Micafungin (ended 9/27) - Off pressors.  - Stable on  lower dose of midodrine on HD days.    2. Cardiac arrest (PEA/bradycardic) - 8/19 and 8/28 in setting of tamponade - resolved  3. Cardiac tamponade with emergent bedside sternotomy  - Diffuse epicardial bleeding with post-op Dresslers - return to OR 8/21 and 8/24 for washouts.  - Washout 8/28 in OR. Multiple units of blood products in OR.   - Chest closed on 9/2  4. Acute hypoxemic respiratory failure - Off ECMO.  - Perc Trach placed 9/7  - s/p bronchoscopy w/ BAL 9/4.  - tracheal aspirate 09/13 growing few GPC and rare yeast  - Started back on IV abx as above d/t concern for sepsis.  -  Trach decannulated 9/25  - Resolved. On room air.   5. AKI due to ATN - CRRT started 08/28.  -  Remains anuric - Transitioned to [facility] 9/21. Nephrology following  - s/p [facility] cath by IR   - tolerating iHD (TTS schedule). At some point will need to go to HD in the chair.   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 Now off colchicine   7. 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 Continue ASA 81 mg daily  8. DM2 - continue SSI   9. PAF/AFL - Tolerates poorly.  - Recurrent AFL. S/p DCCV to SR 08/29.  - Off amio d/t bradycardia - Not on AC for now d/t bleeding, DVT dose heparin.  - IN SR  10. ID - Completed course of abx with open chest.   11. FEN - Underwent gastrostomy tube placement 10/11 - Modified Barium Swallow 10/13: now tolerating Dysphagia 3 diet.  - Getting bolus feeds.  - SLP + RD following.   12. Hyperkalemia - Resolved.   13. Unstageable Pressure Ulcer, Buttock  - WOC following.  - Started on hydrotherapy.   17. Fall - management per primary team  15. Diarrhea - mgmt per primary - Imodium, some improvement  Outpatient HD chair approved.  PT/OT following. Needs SNF for rehab at discharge but has no insurance. HF TOC CM assisting with disposition.  Earnie Larsson, AGACNP-BC  8:27 AM  Patient seen and examined with the above-signed  Advanced Practice Provider and/or Housestaff. I personally reviewed laboratory data, imaging studies and relevant notes. I independently examined the patient and formulated the important aspects of the plan. I have edited the note to reflect any of my changes or salient points. I have personally discussed the plan with the patient and/or family.  BP stable. Tolerating HD. Remains in NSR. PO intake improving. Working with PT/OT.   General:  Sitting up. No resp difficulty HEENT: normal Neck: supple. no JVD. Carotids 2+ bilat; no bruits. No lymphadenopathy or thryomegaly appreciated. Cor: PMI nondisplaced. Regular rate & rhythm. No rubs, gallops or murmurs. + perm cath Lungs: clear Abdomen: soft, nontender, nondistended. No hepatosplenomegaly. No bruits or masses. Good bowel sounds. Extremities: no cyanosis, clubbing, rash, edema Neuro: alert & orientedx3, cranial nerves grossly intact. moves all 4 extremities w/o difficulty. Affect pleasant  Stable from cardiac perspective. No new cardiac recs. Continue midodrine as needed for HD support.   Glori Bickers, MD  12:57 PM

## 2021-11-29 LAB — RENAL FUNCTION PANEL
Albumin: 2.9 g/dL — ABNORMAL LOW (ref 3.5–5.0)
Anion gap: 14 (ref 5–15)
BUN: 22 mg/dL (ref 8–23)
CO2: 25 mmol/L (ref 22–32)
Calcium: 8.8 mg/dL — ABNORMAL LOW (ref 8.9–10.3)
Chloride: 96 mmol/L — ABNORMAL LOW (ref 98–111)
Creatinine, Ser: 4.71 mg/dL — ABNORMAL HIGH (ref 0.61–1.24)
GFR, Estimated: 13 mL/min — ABNORMAL LOW (ref >60)
Glucose, Bld: 69 mg/dL — ABNORMAL LOW (ref 70–99)
Phosphorus: 2.9 mg/dL (ref 2.5–4.6)
Potassium: 4.9 mmol/L (ref 3.5–5.1)
Sodium: 135 mmol/L (ref 135–145)

## 2021-11-29 LAB — GLUCOSE, CAPILLARY
Glucose-Capillary: 125 mg/dL — ABNORMAL HIGH (ref 70–99)
Glucose-Capillary: 128 mg/dL — ABNORMAL HIGH (ref 70–99)
Glucose-Capillary: 137 mg/dL — ABNORMAL HIGH (ref 70–99)
Glucose-Capillary: 212 mg/dL — ABNORMAL HIGH (ref 70–99)
Glucose-Capillary: 69 mg/dL — ABNORMAL LOW (ref 70–99)
Glucose-Capillary: 92 mg/dL (ref 70–99)
Glucose-Capillary: 95 mg/dL (ref 70–99)
Glucose-Capillary: 98 mg/dL (ref 70–99)

## 2021-11-29 LAB — MAGNESIUM: Magnesium: 1.7 mg/dL (ref 1.7–2.4)

## 2021-11-29 MED ORDER — INSULIN ASPART 100 UNIT/ML IJ SOLN
0.0000 [IU] | INTRAMUSCULAR | Status: DC
  Administered 2021-11-30 – 2021-12-02 (×4): 1 [IU] via SUBCUTANEOUS

## 2021-11-29 MED ORDER — OSMOLITE 1.5 CAL PO LIQD
237.0000 mL | Freq: Four times a day (QID) | ORAL | Status: DC
  Administered 2021-11-29 – 2021-12-02 (×5): 237 mL
  Filled 2021-11-29: qty 237

## 2021-11-29 NOTE — Progress Notes (Signed)
[address]       ,[place] [address]             [phone]      9 Days Post-Op Procedure(s) (LRB): G-Tube Placement (N/A) Subjective: Being evaluated for poss CIR admission, feels ok  Objective: Vital signs in last 24 hours: Temp:  [97.7 F (36.5 C)-99.1 F (37.3 C)] 98.9 F (37.2 C) (10/20 0811) Pulse Rate:  [91-101] 96 (10/20 0811) Cardiac Rhythm: Normal sinus rhythm (10/20 0726) Resp:  [15-35] 16 (10/20 0811) BP: (101-139)/(60-94) 101/60 (10/20 0811) SpO2:  [97 %-100 %] 98 % (10/20 0811) Weight:  [84.8 kg-86.2 kg] 84.8 kg (10/20 0510)  Hemodynamic parameters for last 24 hours:    Intake/Output from previous day: 10/19 0701 - 10/20 0700 In: 240 [P.O.:240] Out: 1300  Intake/Output this shift: No intake/output data recorded.  General appearance: alert, cooperative, and no distress Heart: regular rate and rhythm Lungs: dim in bases Abdomen: benign Extremities: no edema Wound: dressings CDI  Lab Results: Recent Labs    11/28/21 0021  WBC 9.5  HGB 10.4*  HCT 33.9*  PLT 360   BMET:  Recent Labs    11/28/21 0021 11/29/21 0019  NA 135 135  K 4.7 4.9  CL 96* 96*  CO2 27 25  GLUCOSE 95 69*  BUN 34* 22  CREATININE 6.08* 4.71*  CALCIUM 8.9 8.8*    PT/INR: No results for input(s): "LABPROT", "INR" in the last 72 hours. ABG    Component Value Date/Time   PHART 7.301 (L) 10/22/2021 2354   HCO3 19.3 (L) 10/22/2021 2354   TCO2 20 (L) 10/22/2021 2354   ACIDBASEDEF 7.0 (H) 10/22/2021 2354   O2SAT 71.7 11/02/2021 0439   CBG (last 3)  Recent Labs    11/29/21 0029 11/29/21 0517 11/29/21 0812  GLUCAP 95 92 137*    Meds Scheduled Meds:  aspirin  81 mg Oral Daily   atorvastatin  80 mg Oral Daily   Chlorhexidine Gluconate Cloth  6 each Topical Daily   darbepoetin (ARANESP) injection - DIALYSIS  100 mcg Intravenous Q Tue-HD   feeding supplement (OSMOLITE 1.5 CAL)  237 mL Per Tube QID   fentaNYL  1 patch Transdermal D32I    folic acid  1 mg Oral Daily   Gerhardt's butt cream   Topical Daily   heparin injection (subcutaneous)  5,000 Units Subcutaneous Q8H   insulin aspart  0-20 Units Subcutaneous Q4H   leptospermum manuka honey  1 Application Topical Daily   loperamide HCl  4 mg Oral TID   midodrine  10 mg Oral 3 times per day on Tue Thu Sat   multivitamin  1 tablet Oral QHS   mouth rinse  15 mL Mouth Rinse 4 times per day   pantoprazole  40 mg Oral BID   QUEtiapine  50 mg Oral QHS   sodium chloride flush  3 mL Intravenous Q12H   thiamine  100 mg Oral Daily   Continuous Infusions: PRN Meds:.acetaminophen, camphor-menthol, clonazePAM, dextrose, diphenhydrAMINE, iohexol, ipratropium-albuterol, metoprolol tartrate, ondansetron (ZOFRAN) IV, mouth rinse, [EXPIRED] oxyCODONE **FOLLOWED BY** oxyCODONE, polyvinyl alcohol, sodium chloride flush, white petrolatum  Xrays No results found.  Assessment/Plan: S/P Procedure(s) (LRB): G-Tube Placement (N/A)   1 Tmax 99.1, VSS, sinus rhythmBS control is good 2 sats good on RA 3 good UOP, nephrology managing dialysis/renal issues 4 reg diet/tube feeds 5 conts current wound care 6 poss CIR soon      LOS: 62 days  [name] 11/29/2021

## 2021-11-29 NOTE — Progress Notes (Addendum)
Addendum 13:58 - Confirmed with MD to transition pt to Dakin's solution at this time to address noted green tinged drainage.   Physical Therapy Wound Treatment Patient Details  Name: GARREN GREENMAN MRN: [id number] Date of Birth: 03/09/1960  Today's Date: 11/29/2021 Time: 5747-3403 Time Calculation (min): 40 min  Subjective  Subjective Assessment Subjective: Pt reporting R shoulder soreness after prolonged R sidelying for session, but able to reposition to reduce discomfort. Patient and Family Stated Goals: Heal wound, decrease pain Date of Onset:  (Unknown) Prior Treatments: Dressing changes, Dakin's solution  Pain Score:  Pre-medicated, tolerated well  Wound Assessment  Pressure Injury 10/12/21 Sacrum Mid;Lower;Bilateral Unstageable - Full thickness tissue loss in which the base of the injury is covered by slough (yellow, tan, gray, green or brown) and/or eschar (tan, brown or black) in the wound bed. (Active)  Wound Image  11/26/21 1539  Dressing Type Foam - Lift dressing to assess site every shift;Gauze (Comment);Honey;Barrier Film (skin prep) 11/29/21 1017  Dressing Clean, Dry, Intact;Changed 11/29/21 1017  Dressing Change Frequency Daily 11/29/21 1017  State of Healing Non-healing 11/29/21 1017  Site / Wound Assessment Pink;Yellow;Red 11/29/21 1017  % Wound base Red or Granulating 45% 11/29/21 1017  % Wound base Yellow/Fibrinous Exudate 55% 11/29/21 1017  % Wound base Black/Eschar 0% 11/29/21 1017  % Wound base Other/Granulation Tissue (Comment) 0% 11/29/21 1017  Peri-wound Assessment Intact 11/29/21 1017  Wound Length (cm) 13 cm 11/26/21 1539  Wound Width (cm) 11 cm 11/26/21 1539  Wound Depth (cm) 2.5 cm 11/26/21 1539  Wound Surface Area (cm^2) 143 cm^2 11/26/21 1539  Wound Volume (cm^3) 357.5 cm^3 11/26/21 1539  Tunneling (cm) 0 11/28/21 1949  Undermining (cm) 0 11/28/21 1949  Margins Unattached edges (unapproximated) 11/29/21 1017  Drainage Amount Moderate 11/29/21 1017   Drainage Description Purulent;Green 11/29/21 1017  Treatment Debridement (Selective);Irrigation;Packing (Dry gauze);Other (Comment) 11/29/21 1017   Selective Debridement (non-excisional) Selective Debridement (non-excisional) - Location: Sacrum Selective Debridement (non-excisional) - Tools Used: Forceps, Scalpel Selective Debridement (non-excisional) - Tissue Removed: Yellow nonviable tissue/slough and necrotic adipose tissue    Wound Assessment and Plan  Wound Therapy - Assess/Plan/Recommendations Wound Therapy - Clinical Statement: Upon removal of pt's dressing at start of session, noted green tinged drainage on his packing. Notified MD to inquire whether to continue using medihoney or switch to Dakin's or another solution, awaiting response. Pt tolerated session well with pre-medication, allowing for a large amount of nonviable tissue to be removed with selective debridement. This patient will benefit from continued hydrotherapy for selective removal of unviable tissue, to decrease bioburden, and promote wound bed healing. Wound Therapy - Functional Problem List: Global weakness in the setting of extended hospital stay and multiple medical problems. Factors Delaying/Impairing Wound Healing: Diabetes Mellitus, Immobility, Multiple medical problems, Polypharmacy Hydrotherapy Plan: Debridement, Dressing change, Patient/family education, Pulsatile lavage with suction Wound Therapy - Frequency: Other (comment) (2x/week) Wound Therapy - Follow Up Recommendations: dressing changes by RN  Wound Therapy Goals- Improve the function of patient's integumentary system by progressing the wound(s) through the phases of wound healing (inflammation - proliferation - remodeling) by: Wound Therapy Goals - Improve the function of patient's integumentary system by progressing the wound(s) through the phases of wound healing by: Decrease Necrotic Tissue to: 20% Decrease Necrotic Tissue - Progress: Progressing  toward goal Increase Granulation Tissue to: 80% Increase Granulation Tissue - Progress: Progressing toward goal Goals/treatment plan/discharge plan were made with and agreed upon by patient/family: Yes Time For Goal Achievement: 7 days Wound  Therapy - Potential for Goals: Good  Goals will be updated until maximal potential achieved or discharge criteria met.  Discharge criteria: when goals achieved, discharge from hospital, MD decision/surgical intervention, no progress towards goals, refusal/missing three consecutive treatments without notification or medical reason.  GP     Charges PT Wound Care Charges $Wound Debridement up to 20 cm: < or equal to 20 cm $ Wound Debridement each add'l 20 sqcm: 3 $PT Hydrotherapy Visit: 1 Visit       Moishe Spice, PT, DPT Acute Rehabilitation Services  Office: [phone]   Orvan Falconer 11/29/2021, 10:27 AM

## 2021-11-29 NOTE — Progress Notes (Signed)
Mobility Specialist Progress Note    11/29/21 1632  Mobility  Activity Ambulated with assistance in hallway  Level of Assistance +2 (takes two people) (chair follow)  Assistive Device Front wheel walker  Distance Ambulated (ft) 270 ft (135+55+80)  Activity Response Tolerated well  Mobility Referral Yes  $Mobility charge 1 Mobility   Pre-Mobility: 94 HR Post-Mobility: 104 HR  Pt received in bed and agreeable. ModA to stand. No complaints on walk. Took x2 seated rest breaks. Returned to bed with call bell in reach.    Hildred Alamin Mobility Specialist  Secure Chat Only

## 2021-11-29 NOTE — Progress Notes (Signed)
Speech Language Pathology Treatment: Dysphagia  Patient Details Name: [name] MRN: [id number] DOB: January 26, 1961 Today's Date: 11/29/2021 Time: 1009-1030 SLP Time Calculation (min) (ACUTE ONLY): 21 min  Assessment / Plan / Recommendation Clinical Impression  Pt was seen in elevated sidelying as he had just finished hydrotherapy and this was the position in which he was most comfortable. He consumed regular solids and thin liquids with no cueing needed to manage oral residue. He denies any difficulty during meals and says that he is still trying to monitor for oral residue but is finding that he has little to none. This is also observed by SLP today. Pt prefers to d/c SLP services as he feels like he can manage his swallowing well now, and I think this could be appropriate. He may be appropriate for a higher level cognitive evaluation at next level of care, and I did discuss this with him. SLP to sign off acutely though.    HPI [name], bradycardia and hypotension. Pt with bil pleural effusion and pericardial effusion s/p VATS with pericardial window 8/19. 8/20 cardiac tamponade with arrest,  intubated with bedside mediastinal exploration and ECMO via fem access. OR 8/21 for reexploration due to mediastinal hemorrhage. 8/28 OR for washout with ECMO decannulation and Impella placed. 8/29 brief PEA arrest with DCCV to NSR. 9/2 chest closure. 9/7 trach and Impella removed.9/11 VAC removed. Decannulated 9/26. PMHx: CAD s/p recent CABG x5 (admitted  8/3-11/23 with post op Afib), CKD stage II, DM, HTN, AKI, Afib and CVA      SLP Plan  All goals met      Recommendations for follow up therapy are one component of a multi-disciplinary discharge planning process, led by the attending physician.  Recommendations may be updated based on patient status, additional functional criteria and insurance authorization.    Recommendations  Diet recommendations: Regular;Thin  liquid Liquids provided via: Cup;Straw Medication Administration: Whole meds with liquid Supervision: Intermittent supervision to cue for compensatory strategies Compensations: Minimize environmental distractions;Lingual sweep for clearance of pocketing Postural Changes and/or Swallow Maneuvers: Seated upright 90 degrees                Oral Care Recommendations: Oral care BID Follow Up Recommendations: Acute inpatient rehab (3hours/day) Assistance recommended at discharge: Frequent or constant Supervision/Assistance SLP Visit Diagnosis: Dysphagia, oropharyngeal phase (R13.12) Plan: All goals met           Osie Bond., M.A. [place] Office [phone]  Secure chat preferred   11/29/2021, 10:36 AM

## 2021-11-29 NOTE — Progress Notes (Signed)
Inpatient Rehabilitation Admissions Coordinator   I had conference call with pt's ex wife in [place] and his Step Mom [name] in [place]. We discussed expectations, goals, and estimated cost of care for a CIR admit. We also discussed projected care needs after an average 2 week estimated LOS at CIR with wound care, PEG feeds, transportation to OP hemodialysis as well as ADL assistance. They will discuss further what they feel they can provide and then let me know whether they would like CIR admit then discharge to an Air B and B vs SNF.  Rehab MD would have to approve the discharge disposition and goals before pursuing admit. I have contacted TOC SW, Cyrus, to provide with an update of our conversation.   Danne Baxter, RN, MSN Rehab Admissions Coordinator [phone] 11/29/2021 1:48 PM

## 2021-11-29 NOTE — Inpatient Diabetes Management (Signed)
Inpatient Diabetes Program Recommendations  AACE/ADA: New Consensus Statement on Inpatient Glycemic Control (2015)  Target Ranges:  Prepandial:   less than 140 mg/dL      Peak postprandial:   less than 180 mg/dL (1-2 hours)      Critically ill patients:  140 - 180 mg/dL   Lab Results  Component Value Date   GLUCAP 137 (H) 11/29/2021   HGBA1C 6.5 (H) 09/10/2021    Review of Glycemic Control  Latest Reference Range & Units 11/29/21 00:00 11/29/21 00:29 11/29/21 05:17 11/29/21 08:12  Glucose-Capillary 70 - 99 mg/dL 69 (L) 95 92 137 (H)  (L): Data is abnormally low (H): Data is abnormally high Diabetes history: Type 2 DM Outpatient Diabetes medications: Basaglar 71 units QD, Trulicity Qweek Current orders for Inpatient glycemic control: Novolog 0-20 units Q4H Bolus feeds - 0800, 1200, 1600, 2000  Inpatient Diabetes Program Recommendations:    Noted mild hypoglycemia following Novolog 4 units correction. With renal status consider reducing correction to Novolog 0-6 units Q4H.   Thanks, Bronson Curb, MSN, RNC-OB Diabetes Coordinator [phone] (8a-5p)

## 2021-11-29 NOTE — Progress Notes (Signed)
Brief Nutrition Note  Spoke with pt at bedside. Pt reports that bolus tube feeding at 1600 yesterday went well. He denies any diarrhea at this time. Last BM documented as small type 6 overnight. Pt reports that he did not receive the bolus tube feeding at 2000 yesterday evening because the way in which it was offered to him made pt feel like it was insinuated that he did not need it. Discussed with pt that all bolus tube feeds are needed at this time to help bridge the gap between what he is eating and his overall nutrition needs.  Pt is willing to take bolus tube feeds. He would prefer not to be asked each time whether he wants the bolus feed but instead would like it to be administered at the scheduled time. Discussed with RN.  Pt willing to take 2000 feeding today as next bolus feed. RD will adjust tube feeding orders: - 237 ml (1 full carton) Osmolite 1.5 cal formula QID with next bolus scheduled for 2000 today  0800: bolus 1 carton  1200: bolus 1 carton  1600: bolus 1 carton  2000: bolus 1 carton - Flush PEG with 20 ml free water before and after each bolus   Tube feeding regimen over 24 hours will provide 1420 kcal, 60 grams of protein, and 724 ml of H2O (meets 65% of minimum kcal needs and 44% of minimum protein needs).  Total free water with flushes: 884 ml  RD's goal is for pt to build up to receiving 1.5 cartons (355 ml) of Osmolite 1.5 cal formula QID. Will continue to work towards this goal.   RD will continue to follow pt during admission.   Gustavus Bryant, MS, RD, LDN Inpatient Clinical Dietitian Please see AMiON for contact information.

## 2021-11-29 NOTE — Discharge Instructions (Signed)

## 2021-11-29 NOTE — Progress Notes (Signed)
Patient  has refused tube feeds for today--I have reminded him that per Katie/dietician, tube feedings were changed to be more tolerable, he is still wanted less feeds, Katie made aware, and has changed tube feed shedule

## 2021-11-29 NOTE — Progress Notes (Signed)
Physical Therapy Treatment Patient Details Name: [name] MRN: [id number] DOB: 1960/12/11 Today's Date: 11/29/2021   History of Present Illness Pt is a 61 y.o. male admitted 09/28/21 with SOB, bradycardia, hypotension. Workup for bilateral pleural effusion, pericardial effusion s/p VATS with pericardial window 8/19. Cardiac arrest with tamponade 8/20, intubated with bedside mediastinal exploration, ECMO via fem access. Pt with mediastinal hemorrhage s/p reexploration 8/21. To OR 8/28 for washout, ECMO decannulation, Impella placement. CRRT initiated 8/28. Brief PEA arrest 8/29 with DCCV to NSR.  S/p chest closure 9/2. S/p trach and impella removed 9/7. S/p wound vac removal 9/11. Course complicated by bouts of hypotension and bradycardia. Off CRRT 9/19. Trach decannulated 9/25. Tunned HD cath placement 9/25; iHD initiated. PMH includes CAD (s/p recent CABG 09/12/21), CKD2, DM, HTN, AKI, afib, CVA.    PT Comments    Pt progressing with mobility. Today's session focused on gait endurance, transfers, and dynamic sitting balance. Ambulated several bouts (70+70+140) with necessary chair follow due to several prolonged seated and standing rest breaks. Pt remains limited by generalized weakness, decreased activity tolerance, and impaired balance strategies/postural reactions. Pt needs to prioritize sit to stand transfers to maximize function as pt requires mod-maxA to stand from various surface heights. Also requires frequent verbal cues for sternal precautions. Continue to recommend acute PT services to maximize functional mobility and independence prior to return home.    Recommendations for follow up therapy are one component of a multi-disciplinary discharge planning process, led by the attending physician.  Recommendations may be updated based on patient status, additional functional criteria and insurance authorization.  Follow Up Recommendations  Acute inpatient rehab (3hours/day) (pending family  support) Can patient physically be transported by private vehicle: Yes   Assistance Recommended at Discharge Frequent or constant Supervision/Assistance  Patient can return home with the following A lot of help with bathing/dressing/bathroom;A lot of help with walking and/or transfers;Direct supervision/assist for financial management;Help with stairs or ramp for entrance;Direct supervision/assist for medications management;Assistance with cooking/housework;Assist for transportation   Equipment Recommendations  Wheelchair (measurements PT);Wheelchair cushion (measurements PT);Hospital bed    Recommendations for Other Services       Precautions / Restrictions Precautions Precautions: Sternal;Fall;Other (comment) Precaution Comments: painful sacral/buttocks wound, sternal wound Restrictions Weight Bearing Restrictions: No Other Position/Activity Restrictions: initial CABG 09/12/21 with closure 9/2     Mobility  Bed Mobility           Sit to supine: Mod assist, +2 for physical assistance   General bed mobility comments: Mod +2 for B LEs and trunk, likely due to fatigue after mobility    Transfers Overall transfer level: Needs assistance Equipment used: Rolling walker (2 wheels) Transfers: Sit to/from Stand Sit to Stand: Mod assist, Max assist           General transfer comment: Mod A regressing to Max A for two trials, pt needing increased assist with last 50% of transfer; able to complete 1 sit to stand mod A with cues of "pushing floor away from you when standing". Quick strength assessment in attempt to isolate muscle group responsible for LE weakness, unable to identify cause but global LE weakness observed    Ambulation/Gait Ambulation/Gait assistance: Min assist Gait Distance (Feet): 70 Feet Assistive device: Rolling walker (2 wheels) Gait Pattern/deviations: Narrow base of support, Decreased dorsiflexion - left       General Gait Details: Occassional scissor of  L foot due to weakness/drop foot, especially with fatigue. Ambulated 11ft +19ft with seated rest breaks  from chair follow, then completed 140 ft with 3 prolonged standing rest breaks with cues for energy conservation. Pt demonstrated motivation to increase gait speed during session   Stairs             Wheelchair Mobility    Modified Rankin (Stroke Patients Only)       Balance     Sitting balance-Leahy Scale: Good Sitting balance - Comments: seated on EOB upon entry, demonstrated dynamic sitting balance by applying lotion to LEs from anterior thigh to distal 1/3 of anterior shin   Standing balance support: Bilateral upper extremity supported Standing balance-Leahy Scale: Poor Standing balance comment: reliant on RW for support                            Cognition Arousal/Alertness: Awake/alert Behavior During Therapy: Anxious (verbally direct when irritated) Overall Cognitive Status: Impaired/Different from baseline                     Current Attention Level: Selective Memory: Decreased recall of precautions Following Commands: Follows one step commands consistently Safety/Judgement: Decreased awareness of safety, Decreased awareness of deficits   Problem Solving: Slow processing, Requires verbal cues General Comments: Pt requires frequent verbal cues for techniques to comply with sternal precautions, intermittent carryover during session        Exercises General Exercises - Lower Extremity Hip Flexion/Marching: Both, Seated, 10 reps, AROM    General Comments General comments (skin integrity, edema, etc.): VSS on RA, pt repeatedly expressed concerns of dry skin on feet      Pertinent Vitals/Pain Pain Assessment Pain Score: 4  Faces Pain Scale: No hurt Pain Location: perineal wounds (0/10 at rest, increases to 4/10 with mobility) Pain Descriptors / Indicators: Discomfort, Aching Pain Intervention(s): Monitored during session, Repositioned     Home Living                          Prior Function            PT Goals (current goals can now be found in the care plan section) Acute Rehab PT Goals PT Goal Formulation: With patient Time For Goal Achievement: 12/13/21 Potential to Achieve Goals: Good Progress towards PT goals: Progressing toward goals    Frequency    Min 3X/week      PT Plan Current plan remains appropriate    Co-evaluation              AM-PAC PT "6 Clicks" Mobility   Outcome Measure  Help needed turning from your back to your side while in a flat bed without using bedrails?: A Little Help needed moving from lying on your back to sitting on the side of a flat bed without using bedrails?: A Lot Help needed moving to and from a bed to a chair (including a wheelchair)?: A Lot Help needed standing up from a chair using your arms (e.g., wheelchair or bedside chair)?: A Lot Help needed to walk in hospital room?: A Little Help needed climbing 3-5 steps with a railing? : A Lot 6 Click Score: 14    End of Session Equipment Utilized During Treatment: Gait belt Activity Tolerance: Patient limited by fatigue Patient left: in bed;with call bell/phone within reach Nurse Communication: Mobility status PT Visit Diagnosis: Other abnormalities of gait and mobility (R26.89);Difficulty in walking, not elsewhere classified (R26.2);Muscle weakness (generalized) (M62.81)     Time: 2585-2778 PT  Time Calculation (min) (ACUTE ONLY): 29 min  Charges:  $Therapeutic Exercise: 8-22 mins $Therapeutic Activity: 8-22 mins                     Chipper Oman, SPT    [place] Jamontae Thwaites 11/29/2021, 1:16 PM

## 2021-11-29 NOTE — Progress Notes (Addendum)
Patient ID: [name], male   DOB: 09/08/1960, 61 y.o.   MRN: [id number]   Advanced Heart Failure Rounding Note   Subjective:    - 8/19 Pericardial window - 8/20 Cardiac arrest with tamponade -> Emergent bedside washout - 09/29/21 VA Cannulation - 09/30/21 Return to OR for mediastinal hemorrhage - 10/01/21 Developed AF -> amio - 10/02/21 TEE EF 25-30%  - 10/04/21 OR for washout. C/b continued bleeding overnight - 10/07/21 Placement of Impella 5.5 with washout, VA ECMO decannulation. Hypotensive with development of severe RV dysfunction after ECMO off and pressors titrated up. Multiple units of blood products. - 10/08/21 Brief PEA arrest. AFL with RVR >> S/p DCCV to SR, back in AFL shortly after - 8/31 Give 1UPRBCs  - 9/2 OR for chest closure - 9/3 Hypotensive overnight w/ SBPs in 80s. Febrile, mTemp 100.8. CRRT paused. VP increased to 0.04.  - 9/4 s/p bronchoscopy w/ BAL by PCCM, Cx NGTD  - 9/5 vomiting w/ large volume NGT output + watery/foul diarrhea, Tube feeds held. No signs of ileus on KUB. C-diff negative   - 9/7 OR for Impella Extraction and percutaneous tracheostomy. 2 u RBCs. - 9/8 CVVH stopped and line removed for holiday - 9/9 CVVH restarted.  -9/13 Worsening leukocytosis and pressor requirements. Purulent drainage from arterial line. Started Daptomycin, Micafungin and Meropenem. - 9/17 Daptomycin + Micafungin stopped. Remains on Meropenum  - 9/21 transitioned to iHD  - 9/25 Tunneled HD cath placed. Trach decannulation - 9/26 Hgb 6.1>>transfused 2uRBC  - 10/11 gtube placed  Continues to ambulate Tolerating hydrotherapy.   No complaints. Denies CP and SOB.    Objective:     Vital Signs:   Temp:  [97.7 F (36.5 C)-99.1 F (37.3 C)] 98.9 F (37.2 C) (10/20 0811) Pulse Rate:  [91-101] 96 (10/20 0811) Resp:  [15-35] 16 (10/20 0811) BP: (101-139)/(60-94) 101/60 (10/20 0811) SpO2:  [97 %-100 %] 98 % (10/20 0811) Weight:  [84.8 kg-86.2 kg] 84.8 kg (10/20 0510) Last BM  Date : 11/28/21  Weight change: Filed Weights   11/28/21 0833 11/28/21 1230 11/29/21 0510  Weight: 86.2 kg 84.8 kg 84.8 kg    Intake/Output:   Intake/Output Summary (Last 24 hours) at 11/29/2021 0829 Last data filed at 11/28/2021 2158 Gross per 24 hour  Intake 240 ml  Output 1300 ml  Net -1060 ml    General:  well appearing.  No respiratory difficulty HEENT: normal Neck: supple. JVD ~7 cm. Carotids 2+ bilat; no bruits. No lymphadenopathy or thyromegaly appreciated. LIJ HD Cor: PMI nondisplaced. Regular rate & rhythm. No rubs, gallops or murmurs. Lungs: clear Abdomen: soft, nontender, nondistended. No hepatosplenomegaly. No bruits or masses. Good bowel sounds. Extremities: no cyanosis, clubbing, rash, edema  Neuro: alert & oriented x 3, cranial nerves grossly intact. moves all 4 extremities w/o difficulty. Affect pleasant.   Telemetry: NSR 90s (Personally reviewed)    Labs: Basic Metabolic Panel: Recent Labs  Lab 11/25/21 0643 11/26/21 0013 11/27/21 0014 11/28/21 0021 11/29/21 0019  NA 133* 133* 136 135 135  K 3.6 4.2 4.1 4.7 4.9  CL 95* 93* 95* 96* 96*  CO2 _0 GLUCOSE 93 115* 87 95 69*  BUN 34* 40* 22 34* 22  CREATININE 5.65* 6.77* 4.51* 6.08* 4.71*  CALCIUM 8.9 8.8* 8.5* 8.9 8.8*  MG 1.7 1.8 1.7 1.8 1.7  PHOS 4.9* 5.5* 3.3 3.1 2.9    Liver Function Tests: Recent Labs  Lab 11/25/21 3329 11/26/21 0013 11/27/21 0014  11/28/21 0021 11/29/21 0019  ALBUMIN 2.9* 2.9* 2.8* 2.8* 2.9*   No results for input(s): "LIPASE", "AMYLASE" in the last 168 hours. No results for input(s): "AMMONIA" in the last 168 hours.  CBC: Recent Labs  Lab 11/23/21 1350 11/24/21 0027 11/25/21 0643 11/26/21 0013 11/28/21 0021  WBC 9.0 9.0 9.3 8.4 9.5  HGB 10.0* 10.3* 10.3* 10.5* 10.4*  HCT 32.3* 34.9* 33.1* 34.0* 33.9*  MCV 92.8 93.8 90.9 90.7 90.4  PLT 416* 391 376 398 360    Cardiac Enzymes: No results for input(s): "CKTOTAL", "CKMB", "CKMBINDEX",  "TROPONINI" in the last 168 hours.   BNP: BNP (last 3 results) Recent Labs    12/24/20 0651 09/28/21 0405  BNP 412.0* 826.6*    ProBNP (last 3 results) No results for input(s): "PROBNP" in the last 8760 hours.  Other results:  Imaging: No results found.   Medications:     Scheduled Medications:  aspirin  81 mg Oral Daily   atorvastatin  80 mg Oral Daily   Chlorhexidine Gluconate Cloth  6 each Topical Daily   darbepoetin (ARANESP) injection - DIALYSIS  100 mcg Intravenous Q Tue-HD   feeding supplement (OSMOLITE 1.5 CAL)  237 mL Per Tube QID   fentaNYL  1 patch Transdermal N98X   folic acid  1 mg Oral Daily   Gerhardt's butt cream   Topical Daily   heparin injection (subcutaneous)  5,000 Units Subcutaneous Q8H   insulin aspart  0-20 Units Subcutaneous Q4H   leptospermum manuka honey  1 Application Topical Daily   loperamide HCl  4 mg Oral TID   midodrine  10 mg Oral 3 times per day on Tue Thu Sat   multivitamin  1 tablet Oral QHS   mouth rinse  15 mL Mouth Rinse 4 times per day   pantoprazole  40 mg Oral BID   QUEtiapine  50 mg Oral QHS   sodium chloride flush  3 mL Intravenous Q12H   thiamine  100 mg Oral Daily    Infusions:  PRN Medications: acetaminophen, camphor-menthol, clonazePAM, dextrose, diphenhydrAMINE, iohexol, ipratropium-albuterol, metoprolol tartrate, ondansetron (ZOFRAN) IV, mouth rinse, [EXPIRED] oxyCODONE **FOLLOWED BY** oxyCODONE, polyvinyl alcohol, sodium chloride flush, white petrolatum   Assessment/Plan:    1. Shock - mixed cardiogenic/hemorrhagic -> VA ECMO -> decannulated on 8/28 to Impella 5.5 - Echo 08/28: Underfilled LV, EF 55-60% with severe LVH and near normal RV systsystolic function.  - Impella extracted 9/7 - Bedside echo 09/13 - LV function preserved, RV mildly reduced. Small clot in posterior pericardium but no hemodynamic effect  - Completed total of 14 days of Micafungin (ended 9/27) - Off pressors.  - Stable on lower dose  of midodrine on HD days.    2. Cardiac arrest (PEA/bradycardic) - 8/19 and 8/28 in setting of tamponade - resolved  3. Cardiac tamponade with emergent bedside sternotomy  - Diffuse epicardial bleeding with post-op Dresslers - return to OR 8/21 and 8/24 for washouts.  - Washout 8/28 in OR. Multiple units of blood products in OR.   - Chest closed on 9/2  4. Acute hypoxemic respiratory failure - Off ECMO.  - Perc Trach placed 9/7  - s/p bronchoscopy w/ BAL 9/4.  - tracheal aspirate 09/13 growing few GPC and rare yeast  - Started back on IV abx as above d/t concern for sepsis.  -  Trach decannulated 9/25  - Resolved. On room air.   5. AKI due to ATN - CRRT started 08/28.  - Remains anuric -  Transitioned to [facility] 9/21. Nephrology following  - s/p [facility] cath by IR   - tolerating iHD (TTS schedule). At some point will need to go to HD in the chair.   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 Now off colchicine   7. 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 Continue ASA 81 mg daily  8. DM2 - continue SSI   9. PAF/AFL - Tolerates poorly.  - Recurrent AFL. S/p DCCV to SR 08/29.  - Off amio d/t bradycardia - Not on AC for now d/t bleeding, DVT dose heparin.  - IN SR  10. ID - Completed course of abx with open chest.   11. FEN - Underwent gastrostomy tube placement 10/11 - Modified Barium Swallow 10/13: now tolerating Dysphagia 3 diet.  - Getting bolus feeds.  - SLP + RD following.   12. Hyperkalemia - Resolved.   13. Unstageable Pressure Ulcer, Buttock  - WOC following.  - Started on hydrotherapy.   58. Fall - management per primary team  15. Diarrhea - mgmt per primary - Imodium, some improvement  Outpatient HD chair approved.  PT/OT following. CIR agreeable to reassess patient as his mother can help at home. [facility] SNF has offered him a bed but he refused due to wanting to go to CIR. Still awaiting insurance. HF TOC CM assisting with  disposition.  Earnie Larsson, AGACNP-BC  8:29 AM  Patient seen and examined with the above-signed Advanced Practice Provider and/or Housestaff. I personally reviewed laboratory data, imaging studies and relevant notes. I independently examined the patient and formulated the important aspects of the plan. I have edited the note to reflect any of my changes or salient points. I have personally discussed the plan with the patient and/or family.  Getting stronger. Denies CP or SOB. Butt still sore. Getting hydrotherapy.   Vitals stable. On midodrine on HD days.   General:  Lying in bed. No resp difficulty HEENT: normal Neck: supple. no JVD. Carotids 2+ bilat; no bruits. No lymphadenopathy or thryomegaly appreciated. Cor: PMI nondisplaced. Regular rate & rhythm. No rubs, gallops or murmurs. + HD cath Lungs: clear Abdomen: soft, nontender, nondistended. No hepatosplenomegaly. No bruits or masses. Good bowel sounds. Extremities: no cyanosis, clubbing, rash, edema Neuro: alert & orientedx3, cranial nerves grossly intact. RUE weak Affect pleasant  Stable from cardiac perspective. Continue current regimen. HF team to see again Monday.   Glori Bickers, MD  2:42 PM

## 2021-11-29 NOTE — Progress Notes (Signed)
Patient ID: [name], male   DOB: [birth year], 61 y.o.   MRN: [id number] S: Said that dialysis was really painful in the chair and he doesn't think that he can complete dialysis in a chair.  Wants to be able to move around.  Discussed this may be difficult.  No other complaints O:BP 101/60 (BP Location: Left Arm)   Pulse 96   Temp 98.9 F (37.2 C) (Oral)   Resp 16   Ht 5\' 7"  (1.702 m)   Wt 84.8 kg   SpO2 98%   BMI 29.28 kg/m   Intake/Output Summary (Last 24 hours) at 11/29/2021 0917 Last data filed at 11/28/2021 2158 Gross per 24 hour  Intake 240 ml  Output 1300 ml  Net -1060 ml   Intake/Output: I/O last 3 completed shifts: In: 480 [P.O.:480] Out: 1300 [Other:1300]  Intake/Output this shift:  No intake/output data recorded. Weight change: 0 kg Gen: NAD, laying on side in bed CVS: Normal rate Resp: Bilateral chest rise with no increased work of breathing Abd: soft, nondistended Ext: no edema, warm and well-perfused Neuro: awake/alert Dialysis access: [facility]  Recent Labs  Lab 11/23/21 1012 11/24/21 0027 11/25/21 0643 11/26/21 0013 11/27/21 0014 11/28/21 0021 11/29/21 0019  NA 136 137 133* 133* 136 135 135  K 4.7 3.9 3.6 4.2 4.1 4.7 4.9  CL 93* 95* 95* 93* 95* 96* 96*  CO2 21* 27 26 27 29 27 25   GLUCOSE 86 82 93 115* 87 95 69*  BUN 59* 23 34* 40* 22 34* 22  CREATININE 6.70* 3.80* 5.65* 6.77* 4.51* 6.08* 4.71*  ALBUMIN 3.1* 3.1* 2.9* 2.9* 2.8* 2.8* 2.9*  CALCIUM 9.5 9.0 8.9 8.8* 8.5* 8.9 8.8*  PHOS 5.6* 3.6 4.9* 5.5* 3.3 3.1 2.9   Liver Function Tests: Recent Labs  Lab 11/27/21 0014 11/28/21 0021 11/29/21 0019  ALBUMIN 2.8* 2.8* 2.9*   No results for input(s): "LIPASE", "AMYLASE" in the last 168 hours. No results for input(s): "AMMONIA" in the last 168 hours. CBC: Recent Labs  Lab 11/23/21 1350 11/24/21 0027 11/25/21 0643 11/26/21 0013 11/28/21 0021  WBC 9.0 9.0 9.3 8.4 9.5  HGB 10.0* 10.3* 10.3* 10.5* 10.4*  HCT 32.3* 34.9* 33.1* 34.0* 33.9*   MCV 92.8 93.8 90.9 90.7 90.4  PLT 416* 391 376 398 360   Cardiac Enzymes: No results for input(s): "CKTOTAL", "CKMB", "CKMBINDEX", "TROPONINI" in the last 168 hours. CBG: Recent Labs  Lab 11/28/21 2006 11/29/21 0000 11/29/21 0029 11/29/21 0517 11/29/21 0812  GLUCAP 183* 69* 95 92 137*    Iron Studies: No results for input(s): "IRON", "TIBC", "TRANSFERRIN", "FERRITIN" in the last 72 hours. Studies/Results: No results found.  aspirin  81 mg Oral Daily   atorvastatin  80 mg Oral Daily   Chlorhexidine Gluconate Cloth  6 each Topical Daily   darbepoetin (ARANESP) injection - DIALYSIS  100 mcg Intravenous Q Tue-HD   feeding supplement (OSMOLITE 1.5 CAL)  237 mL Per Tube QID   fentaNYL  1 patch Transdermal O24M   folic acid  1 mg Oral Daily   Gerhardt's butt cream   Topical Daily   heparin injection (subcutaneous)  5,000 Units Subcutaneous Q8H   insulin aspart  0-20 Units Subcutaneous Q4H   leptospermum manuka honey  1 Application Topical Daily   loperamide HCl  4 mg Oral TID   midodrine  10 mg Oral 3 times per day on Tue Thu Sat   multivitamin  1 tablet Oral QHS   mouth rinse  15 mL Mouth Rinse 4 times per day   pantoprazole  40 mg Oral BID   QUEtiapine  50 mg Oral QHS   sodium chloride flush  3 mL Intravenous Q12H   thiamine  100 mg Oral Daily    BMET    Component Value Date/Time   NA 135 11/29/2021 0019   NA 135 02/13/2021 0855   K 4.9 11/29/2021 0019   CL 96 (L) 11/29/2021 0019   CO2 25 11/29/2021 0019   GLUCOSE 69 (L) 11/29/2021 0019   BUN 22 11/29/2021 0019   BUN 9 02/13/2021 0855   CREATININE 4.71 (H) 11/29/2021 0019   CALCIUM 8.8 (L) 11/29/2021 0019   GFRNONAA 13 (L) 11/29/2021 0019   GFRAA >60 08/25/2019 2110   CBC    Component Value Date/Time   WBC 9.5 11/28/2021 0021   RBC 3.75 (L) 11/28/2021 0021   HGB 10.4 (L) 11/28/2021 0021   HGB 15.4 02/13/2021 0855   HCT 33.9 (L) 11/28/2021 0021   HCT 45.7 02/13/2021 0855   PLT 360 11/28/2021 0021   PLT  239 02/13/2021 0855   MCV 90.4 11/28/2021 0021   MCV 88 02/13/2021 0855   MCH 27.7 11/28/2021 0021   MCHC 30.7 11/28/2021 0021   RDW 16.2 (H) 11/28/2021 0021   RDW 11.3 (L) 02/13/2021 0855   LYMPHSABS 2.2 09/29/2021 1956   MONOABS 2.1 (H) 09/29/2021 1956   EOSABS 0.0 09/29/2021 1956   BASOSABS 0.0 09/29/2021 1956    Assessment/Plan:   ESRD - following ischemic ATN in setting of cardiogenic shock/cardiac arrest.  Started CRRT on 10/07/21 and transitioned to IHD on 10/31/21.  s/p [facility] 11/04/21 with IR. Currently on TTS schedule w/ midodrine support.  Financially cleared to start at [facility] TTS however dispo is still pending. 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  s/p ECMO.  Midodrine being weaned: currently on midodrine 10 mg tid on TTS sched. 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 Unstageable pressure ulcer of left buttock - wound care following.  May need more optimal pain control during dialysis

## 2021-11-30 LAB — RENAL FUNCTION PANEL
Albumin: 2.7 g/dL — ABNORMAL LOW (ref 3.5–5.0)
Anion gap: 14 (ref 5–15)
BUN: 32 mg/dL — ABNORMAL HIGH (ref 8–23)
CO2: 26 mmol/L (ref 22–32)
Calcium: 9 mg/dL (ref 8.9–10.3)
Chloride: 97 mmol/L — ABNORMAL LOW (ref 98–111)
Creatinine, Ser: 6.42 mg/dL — ABNORMAL HIGH (ref 0.61–1.24)
GFR, Estimated: 9 mL/min — ABNORMAL LOW (ref >60)
Glucose, Bld: 140 mg/dL — ABNORMAL HIGH (ref 70–99)
Phosphorus: 3.8 mg/dL (ref 2.5–4.6)
Potassium: 4.6 mmol/L (ref 3.5–5.1)
Sodium: 137 mmol/L (ref 135–145)

## 2021-11-30 LAB — MAGNESIUM: Magnesium: 1.6 mg/dL — ABNORMAL LOW (ref 1.7–2.4)

## 2021-11-30 LAB — GLUCOSE, CAPILLARY
Glucose-Capillary: 130 mg/dL — ABNORMAL HIGH (ref 70–99)
Glucose-Capillary: 156 mg/dL — ABNORMAL HIGH (ref 70–99)
Glucose-Capillary: 73 mg/dL (ref 70–99)
Glucose-Capillary: 84 mg/dL (ref 70–99)
Glucose-Capillary: 90 mg/dL (ref 70–99)
Glucose-Capillary: 92 mg/dL (ref 70–99)
Glucose-Capillary: 95 mg/dL (ref 70–99)

## 2021-11-30 MED ORDER — HEPARIN SODIUM (PORCINE) 1000 UNIT/ML IJ SOLN
INTRAMUSCULAR | Status: AC
  Administered 2021-11-30: 3800 [IU]
  Filled 2021-11-30: qty 4

## 2021-11-30 NOTE — Progress Notes (Signed)
A consult was placed to IV Therapy requesting new iv access;  pt only allowing his left arm to be used;  attempted x 2, using ultrasound, however the catheters wouldn't thread into his veins due to scar tissue;  No access at this time;  pt waiting to go to dialysis.  RN aware.

## 2021-11-30 NOTE — Progress Notes (Signed)
Pt requested me to hold his tube feeding and is currently at the edge of the bed sitting up eating his dinner plate. Documented on [facility]

## 2021-11-30 NOTE — Progress Notes (Signed)
Patient has refused 0800 tube feeding, he states he cannot do that today with dialysis because even though tube feeds have been changed somewhat, he still has diarrhea with them and does not want to have bowel movement incontinent episode in HD

## 2021-11-30 NOTE — Progress Notes (Signed)
Patient insists he takes immodium since he ate regular food tray at lunch, even though his bowel is formed, no diarrhea, I have explained that taking immodium without diarrhea present could constipate him, he insists he takes it anyway so that he won't start diarrhea, I have asked dr Cyndia Bent for approval and he has text paged it is ok to give now

## 2021-11-30 NOTE — Plan of Care (Signed)
Problem: Education: Goal: Will demonstrate proper wound care and an understanding of methods to prevent future damage Outcome: Progressing Goal: Knowledge of disease or condition will improve Outcome: Progressing Goal: Knowledge of the prescribed therapeutic regimen will improve Outcome: Progressing Goal: Individualized Educational Video(s) Outcome: Progressing   Problem: Activity: Goal: Risk for activity intolerance will decrease Outcome: Progressing   Problem: Cardiac: Goal: Will achieve and/or maintain hemodynamic stability Outcome: Progressing   Problem: Clinical Measurements: Goal: Postoperative complications will be avoided or minimized Outcome: Progressing   Problem: Respiratory: Goal: Respiratory status will improve Outcome: Progressing   Problem: Skin Integrity: Goal: Wound healing without signs and symptoms of infection Outcome: Progressing Goal: Risk for impaired skin integrity will decrease Outcome: Progressing   Problem: Urinary Elimination: Goal: Ability to achieve and maintain adequate renal perfusion and functioning will improve Outcome: Progressing   Problem: Education: Goal: Understanding of cardiac disease, CV risk reduction, and recovery process will improve Outcome: Progressing Goal: Individualized Educational Video(s) Outcome: Progressing   Problem: Activity: Goal: Ability to tolerate increased activity will improve Outcome: Progressing   Problem: Cardiac: Goal: Ability to achieve and maintain adequate cardiovascular perfusion will improve Outcome: Progressing   Problem: Health Behavior/Discharge Planning: Goal: Ability to safely manage health-related needs after discharge will improve Outcome: Progressing   Problem: Education: Goal: Knowledge of General Education information will improve Description: Including pain rating scale, medication(s)/side effects and non-pharmacologic comfort measures Outcome: Progressing   Problem: Health  Behavior/Discharge Planning: Goal: Ability to manage health-related needs will improve Outcome: Progressing   Problem: Clinical Measurements: Goal: Ability to maintain clinical measurements within normal limits will improve Outcome: Progressing Goal: Will remain free from infection Outcome: Progressing Goal: Diagnostic test results will improve Outcome: Progressing Goal: Respiratory complications will improve Outcome: Progressing Goal: Cardiovascular complication will be avoided Outcome: Progressing   Problem: Activity: Goal: Risk for activity intolerance will decrease Outcome: Progressing   Problem: Nutrition: Goal: Adequate nutrition will be maintained Outcome: Progressing   Problem: Coping: Goal: Level of anxiety will decrease Outcome: Progressing   Problem: Elimination: Goal: Will not experience complications related to bowel motility Outcome: Progressing Goal: Will not experience complications related to urinary retention Outcome: Progressing   Problem: Pain Managment: Goal: General experience of comfort will improve Outcome: Progressing   Problem: Safety: Goal: Ability to remain free from injury will improve Outcome: Progressing   Problem: Skin Integrity: Goal: Risk for impaired skin integrity will decrease Outcome: Progressing   Problem: Education: Goal: Ability to describe self-care measures that may prevent or decrease complications (Diabetes Survival Skills Education) will improve Outcome: Progressing Goal: Individualized Educational Video(s) Outcome: Progressing   Problem: Coping: Goal: Ability to adjust to condition or change in health will improve Outcome: Progressing   Problem: Fluid Volume: Goal: Ability to maintain a balanced intake and output will improve Outcome: Progressing   Problem: Health Behavior/Discharge Planning: Goal: Ability to identify and utilize available resources and services will improve Outcome: Progressing Goal:  Ability to manage health-related needs will improve Outcome: Progressing   Problem: Metabolic: Goal: Ability to maintain appropriate glucose levels will improve Outcome: Progressing   Problem: Nutritional: Goal: Maintenance of adequate nutrition will improve Outcome: Progressing Goal: Progress toward achieving an optimal weight will improve Outcome: Progressing   Problem: Skin Integrity: Goal: Risk for impaired skin integrity will decrease Outcome: Progressing   Problem: Tissue Perfusion: Goal: Adequacy of tissue perfusion will improve Outcome: Progressing   Problem: Activity: Goal: Ability to tolerate increased activity will improve Outcome: Progressing   Problem: Health  Behavior/Discharge Planning: Goal: Ability to manage tracheostomy will improve Outcome: Progressing

## 2021-11-30 NOTE — Progress Notes (Signed)
Patient ID: [name], male   DOB: 1960-09-09, 61 y.o.   MRN: [id number] S: No complaints today. Likely plan for HD in bed. No other changes  O:BP 129/77 (BP Location: Left Arm)   Pulse 96   Temp 97.6 F (36.4 C) (Oral)   Resp 20   Ht 5\' 7"  (1.702 m)   Wt 81.6 kg   SpO2 98%   BMI 28.19 kg/m   Intake/Output Summary (Last 24 hours) at 11/30/2021 1002 Last data filed at 11/30/2021 0800 Gross per 24 hour  Intake 420 ml  Output --  Net 420 ml   Intake/Output: I/O last 3 completed shifts: In: 620 [P.O.:620] Out: -   Intake/Output this shift:  Total I/O In: 60 [P.O.:60] Out: -  Weight change: -4.552 kg Gen: NAD, laying on side in bed CVS: Normal rate Resp: Bilateral chest rise with no increased work of breathing Abd: soft, nondistended Ext: no edema, warm and well-perfused Neuro: awake/alert Dialysis access: [facility]  Recent Labs  Lab 11/24/21 0027 11/25/21 0643 11/26/21 0013 11/27/21 0014 11/28/21 0021 11/29/21 0019 11/30/21 0031  NA 137 133* 133* 136 135 135 137  K 3.9 3.6 4.2 4.1 4.7 4.9 4.6  CL 95* 95* 93* 95* 96* 96* 97*  CO2 27 26 27 29 27 25 26   GLUCOSE 82 93 115* 87 95 69* 140*  BUN 23 34* 40* 22 34* 22 32*  CREATININE 3.80* 5.65* 6.77* 4.51* 6.08* 4.71* 6.42*  ALBUMIN 3.1* 2.9* 2.9* 2.8* 2.8* 2.9* 2.7*  CALCIUM 9.0 8.9 8.8* 8.5* 8.9 8.8* 9.0  PHOS 3.6 4.9* 5.5* 3.3 3.1 2.9 3.8   Liver Function Tests: Recent Labs  Lab 11/28/21 0021 11/29/21 0019 11/30/21 0031  ALBUMIN 2.8* 2.9* 2.7*   No results for input(s): "LIPASE", "AMYLASE" in the last 168 hours. No results for input(s): "AMMONIA" in the last 168 hours. CBC: Recent Labs  Lab 11/23/21 1350 11/24/21 0027 11/25/21 0643 11/26/21 0013 11/28/21 0021  WBC 9.0 9.0 9.3 8.4 9.5  HGB 10.0* 10.3* 10.3* 10.5* 10.4*  HCT 32.3* 34.9* 33.1* 34.0* 33.9*  MCV 92.8 93.8 90.9 90.7 90.4  PLT 416* 391 376 398 360   Cardiac Enzymes: No results for input(s): "CKTOTAL", "CKMB", "CKMBINDEX", "TROPONINI"  in the last 168 hours. CBG: Recent Labs  Lab 11/29/21 2025 11/29/21 2344 11/30/21 0054 11/30/21 0258 11/30/21 0734  GLUCAP 125* 212* 130* 73 92    Iron Studies: No results for input(s): "IRON", "TIBC", "TRANSFERRIN", "FERRITIN" in the last 72 hours. Studies/Results: No results found.  aspirin  81 mg Oral Daily   atorvastatin  80 mg Oral Daily   Chlorhexidine Gluconate Cloth  6 each Topical Daily   darbepoetin (ARANESP) injection - DIALYSIS  100 mcg Intravenous Q Tue-HD   feeding supplement (OSMOLITE 1.5 CAL)  237 mL Per Tube QID   fentaNYL  1 patch Transdermal K93G   folic acid  1 mg Oral Daily   Gerhardt's butt cream   Topical Daily   heparin injection (subcutaneous)  5,000 Units Subcutaneous Q8H   insulin aspart  0-6 Units Subcutaneous Q4H   leptospermum manuka honey  1 Application Topical Daily   loperamide HCl  4 mg Oral TID   midodrine  10 mg Oral 3 times per day on Tue Thu Sat   multivitamin  1 tablet Oral QHS   mouth rinse  15 mL Mouth Rinse 4 times per day   pantoprazole  40 mg Oral BID   QUEtiapine  50 mg Oral  QHS   sodium chloride flush  3 mL Intravenous Q12H   thiamine  100 mg Oral Daily    BMET    Component Value Date/Time   NA 137 11/30/2021 0031   NA 135 02/13/2021 0855   K 4.6 11/30/2021 0031   CL 97 (L) 11/30/2021 0031   CO2 26 11/30/2021 0031   GLUCOSE 140 (H) 11/30/2021 0031   BUN 32 (H) 11/30/2021 0031   BUN 9 02/13/2021 0855   CREATININE 6.42 (H) 11/30/2021 0031   CALCIUM 9.0 11/30/2021 0031   GFRNONAA 9 (L) 11/30/2021 0031   GFRAA >60 08/25/2019 2110   CBC    Component Value Date/Time   WBC 9.5 11/28/2021 0021   RBC 3.75 (L) 11/28/2021 0021   HGB 10.4 (L) 11/28/2021 0021   HGB 15.4 02/13/2021 0855   HCT 33.9 (L) 11/28/2021 0021   HCT 45.7 02/13/2021 0855   PLT 360 11/28/2021 0021   PLT 239 02/13/2021 0855   MCV 90.4 11/28/2021 0021   MCV 88 02/13/2021 0855   MCH 27.7 11/28/2021 0021   MCHC 30.7 11/28/2021 0021   RDW 16.2 (H)  11/28/2021 0021   RDW 11.3 (L) 02/13/2021 0855   LYMPHSABS 2.2 09/29/2021 1956   MONOABS 2.1 (H) 09/29/2021 1956   EOSABS 0.0 09/29/2021 1956   BASOSABS 0.0 09/29/2021 1956    Assessment/Plan:   ESRD - following ischemic ATN in setting of cardiogenic shock/cardiac arrest.  Started CRRT on 10/07/21 and transitioned to IHD on 10/31/21.  s/p [facility] 11/04/21 with IR. Currently on TTS schedule w/ midodrine support.  Financially cleared to start at [facility] TTS however dispo is still pending. Pain with HD in recliner which may be a limiting factor for dispo. Bed today Cardiogenic shock/cardiac arrest - tamponade/hemorrhagic pericarditis, s/p pericardial drain and window.  s/p ECMO.  Midodrine being weaned: currently on midodrine 10 mg tid on TTS sched. 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 Unstageable pressure ulcer of left buttock - wound care following.  May need more optimal pain control during dialysis if he is to sit in a chair

## 2021-11-30 NOTE — Progress Notes (Signed)
I have advised Dr Cyndia Bent that IV team has unsuccessfully tried to start IV, even with using ultrasound, the attempts are not successful, per Dr Cyndia Bent, it is ok to leave patient no IV access

## 2021-11-30 NOTE — TOC Progression Note (Signed)
Transition of Care [facility]) - Progression Note    Patient Details  Name: [name] MRN: [id number] Date of Birth: 08-27-1960  Transition of Care [facility]) CM/SW [place], LCSW Phone Number: 11/30/2021, 10:38 AM  Clinical Narrative:    CSW received notice from CIR that patient will not have needed support for their program. Alternative plan will be [facility] on an Letter of Guarantee. CSW notes family was hoping for a different facility but that is the only offer available. Once Medicaid is approved, family can certainly look into other options at that time. CSW touched base with [facility] and provided patient's dialysis schedule. Noted patient is completing dialysis in the bed today; will follow for tolerance in the chair prior to discharging to SNF.    Expected Discharge Plan: Skilled Nursing Facility Barriers to Discharge: Continued Medical Work up, No SNF bed, SNF Pending Medicaid  Expected Discharge Plan and Services Expected Discharge Plan: Pink                                               Social Determinants of Health (SDOH) Interventions    Readmission Risk Interventions    09/20/2021   11:57 AM  Readmission Risk Prevention Plan  Post Dischage Appt Complete  Medication Screening Complete  Transportation Screening Complete

## 2021-11-30 NOTE — Progress Notes (Addendum)
Received patient in bed to unit.  Alert and oriented.  Informed consent signed and in chart.   Treatment initiated: 1555 Treatment completed: 1803  Patient tolerated well.  Transported back to the room  Alert, without acute distress.  Hand-off given to patient's nurse.   Access used: catheter Access issues: clotted  Total UF removed: 1100 Medication(s) given: none Post HD VS: 98.6, 126/64(83), HR-95, RR-24, SP02-95 Post HD weight: 86.4kg   Pt clotted off mid-treatment. I notified Dr. Mauri Brooklyn and was told to keep him off and he could finish treatment on Monday if needed. [facility]

## 2021-11-30 NOTE — Progress Notes (Signed)
[address]       [place] [address]             [phone]      10 Days Post-Op Procedure(s) (LRB): G-Tube Placement (N/A) Subjective: Feels ok  Objective: Vital signs in last 24 hours: Temp:  [97.6 F (36.4 C)-98.9 F (37.2 C)] 97.6 F (36.4 C) (10/21 0728) Pulse Rate:  [95-97] 96 (10/21 0728) Cardiac Rhythm: Normal sinus rhythm (10/20 1900) Resp:  [16-20] 20 (10/21 0728) BP: (101-132)/(60-80) 129/77 (10/21 0728) SpO2:  [97 %-99 %] 98 % (10/21 0728) Weight:  [81.6 kg] 81.6 kg (10/21 0500)  Hemodynamic parameters for last 24 hours:    Intake/Output from previous day: 10/20 0701 - 10/21 0700 In: 380 [P.O.:380] Out: -  Intake/Output this shift: No intake/output data recorded.  General appearance: alert, cooperative, and no distress Heart: regular rate and rhythm Lungs: Fair air exchange Abdomen: None Extremities: No edema Wound: buttock wound is dressed  Lab Results: Recent Labs    11/28/21 0021  WBC 9.5  HGB 10.4*  HCT 33.9*  PLT 360   BMET:  Recent Labs    11/29/21 0019 11/30/21 0031  NA 135 137  K 4.9 4.6  CL 96* 97*  CO2 25 26  GLUCOSE 69* 140*  BUN 22 32*  CREATININE 4.71* 6.42*  CALCIUM 8.8* 9.0    PT/INR: No results for input(s): "LABPROT", "INR" in the last 72 hours. ABG    Component Value Date/Time   PHART 7.301 (L) 10/22/2021 2354   HCO3 19.3 (L) 10/22/2021 2354   TCO2 20 (L) 10/22/2021 2354   ACIDBASEDEF 7.0 (H) 10/22/2021 2354   O2SAT 71.7 11/02/2021 0439   CBG (last 3)  Recent Labs    11/30/21 0054 11/30/21 0258 11/30/21 0734  GLUCAP 130* 73 92    Meds Scheduled Meds:  aspirin  81 mg Oral Daily   atorvastatin  80 mg Oral Daily   Chlorhexidine Gluconate Cloth  6 each Topical Daily   darbepoetin (ARANESP) injection - DIALYSIS  100 mcg Intravenous Q Tue-HD   feeding supplement (OSMOLITE 1.5 CAL)  237 mL Per Tube QID   fentaNYL  1 patch Transdermal S28B   folic acid  1 mg Oral Daily    Gerhardt's butt cream   Topical Daily   heparin injection (subcutaneous)  5,000 Units Subcutaneous Q8H   insulin aspart  0-6 Units Subcutaneous Q4H   leptospermum manuka honey  1 Application Topical Daily   loperamide HCl  4 mg Oral TID   midodrine  10 mg Oral 3 times per day on Tue Thu Sat   multivitamin  1 tablet Oral QHS   mouth rinse  15 mL Mouth Rinse 4 times per day   pantoprazole  40 mg Oral BID   QUEtiapine  50 mg Oral QHS   sodium chloride flush  3 mL Intravenous Q12H   thiamine  100 mg Oral Daily   Continuous Infusions: PRN Meds:.acetaminophen, camphor-menthol, clonazePAM, dextrose, diphenhydrAMINE, iohexol, ipratropium-albuterol, metoprolol tartrate, ondansetron (ZOFRAN) IV, mouth rinse, [EXPIRED] oxyCODONE **FOLLOWED BY** oxyCODONE, polyvinyl alcohol, sodium chloride flush, white petrolatum  Xrays No results found.  Assessment/Plan: S/P Procedure(s) (LRB): G-Tube Placement (N/A)  1 afebrile, vital signs stable sinus rhythm-on midodrine 2 oxygen saturations good on room air 3 ESRD-nephrology managing renal needs and dialysis-going to dialysis now 4 dysphagia 3 with supplemental tube feedings 5 advanced heart failure team assisting with cardiology management 6 no further A-fib 7 continues wound  care for pressure ulcer and buttock region 8 hopefully can be transferred to [place] soon    LOS: 63 days    Mark Giovanni PA-C Pager [phone] 11/30/2021

## 2021-12-01 LAB — GLUCOSE, CAPILLARY
Glucose-Capillary: 82 mg/dL (ref 70–99)
Glucose-Capillary: 104 mg/dL — ABNORMAL HIGH (ref 70–99)
Glucose-Capillary: 170 mg/dL — ABNORMAL HIGH (ref 70–99)
Glucose-Capillary: 99 mg/dL (ref 70–99)
Glucose-Capillary: 152 mg/dL — ABNORMAL HIGH (ref 70–99)
Glucose-Capillary: 154 mg/dL — ABNORMAL HIGH (ref 70–99)

## 2021-12-01 LAB — RENAL FUNCTION PANEL
Albumin: 2.7 g/dL — ABNORMAL LOW (ref 3.5–5.0)
Anion gap: 12 (ref 5–15)
BUN: 24 mg/dL — ABNORMAL HIGH (ref 8–23)
CO2: 27 mmol/L (ref 22–32)
Calcium: 8.8 mg/dL — ABNORMAL LOW (ref 8.9–10.3)
Chloride: 96 mmol/L — ABNORMAL LOW (ref 98–111)
Creatinine, Ser: 5.51 mg/dL — ABNORMAL HIGH (ref 0.61–1.24)
GFR, Estimated: 11 mL/min — ABNORMAL LOW (ref >60)
Glucose, Bld: 111 mg/dL — ABNORMAL HIGH (ref 70–99)
Phosphorus: 3.2 mg/dL (ref 2.5–4.6)
Potassium: 4.6 mmol/L (ref 3.5–5.1)
Sodium: 135 mmol/L (ref 135–145)

## 2021-12-01 LAB — MAGNESIUM: Magnesium: 1.6 mg/dL — ABNORMAL LOW (ref 1.7–2.4)

## 2021-12-01 NOTE — Plan of Care (Signed)
  Problem: Respiratory: Goal: Respiratory status will improve Outcome: Progressing   Problem: Clinical Measurements: Goal: Respiratory complications will improve Outcome: Progressing Goal: Cardiovascular complication will be avoided Outcome: Progressing   Problem: Activity: Goal: Risk for activity intolerance will decrease Outcome: Progressing   Problem: Elimination: Goal: Will not experience complications related to bowel motility Outcome: Progressing   Problem: Pain Managment: Goal: General experience of comfort will improve Outcome: Progressing   Problem: Activity: Goal: Ability to tolerate increased activity will improve Outcome: Not Progressing   Problem: Nutrition: Goal: Adequate nutrition will be maintained Outcome: Not Progressing   Problem: Elimination: Goal: Will not experience complications related to urinary retention Outcome: Not Progressing

## 2021-12-01 NOTE — Progress Notes (Signed)
Patient ID: [name], male   DOB: [birth year], 61 y.o.   MRN: [id number] S: No complaints today. HD cut short due to clotting system. Lab acceptable today.  O:BP 111/69 (BP Location: Left Arm)   Pulse 97   Temp 99 F (37.2 C) (Oral)   Resp (!) 23   Ht 5\' 7"  (1.702 m)   Wt 84.8 kg   SpO2 98%   BMI 29.28 kg/m   Intake/Output Summary (Last 24 hours) at 12/01/2021 1002 Last data filed at 12/01/2021 0704 Gross per 24 hour  Intake 720 ml  Output 1100 ml  Net -380 ml   Intake/Output: I/O last 3 completed shifts: In: 35 [P.O.:660] Out: 1100 [Other:1100]  Intake/Output this shift:  Total I/O In: 240 [P.O.:240] Out: -  Weight change: 4.99 kg Gen: NAD, laying on side in bed CVS: Normal rate Resp: Bilateral chest rise with no increased work of breathing Abd: soft, nondistended Ext: no edema, warm and well-perfused Neuro: awake/alert Dialysis access: [facility]  Recent Labs  Lab 11/25/21 0643 11/26/21 0013 11/27/21 0014 11/28/21 0021 11/29/21 0019 11/30/21 0031 12/01/21 0012  NA 133* 133* 136 135 135 137 135  K 3.6 4.2 4.1 4.7 4.9 4.6 4.6  CL 95* 93* 95* 96* 96* 97* 96*  CO2 26 27 29 27 25 26 27   GLUCOSE 93 115* 87 95 69* 140* 111*  BUN 34* 40* 22 34* 22 32* 24*  CREATININE 5.65* 6.77* 4.51* 6.08* 4.71* 6.42* 5.51*  ALBUMIN 2.9* 2.9* 2.8* 2.8* 2.9* 2.7* 2.7*  CALCIUM 8.9 8.8* 8.5* 8.9 8.8* 9.0 8.8*  PHOS 4.9* 5.5* 3.3 3.1 2.9 3.8 3.2   Liver Function Tests: Recent Labs  Lab 11/29/21 0019 11/30/21 0031 12/01/21 0012  ALBUMIN 2.9* 2.7* 2.7*   No results for input(s): "LIPASE", "AMYLASE" in the last 168 hours. No results for input(s): "AMMONIA" in the last 168 hours. CBC: Recent Labs  Lab 11/25/21 0643 11/26/21 0013 11/28/21 0021  WBC 9.3 8.4 9.5  HGB 10.3* 10.5* 10.4*  HCT 33.1* 34.0* 33.9*  MCV 90.9 90.7 90.4  PLT 376 398 360   Cardiac Enzymes: No results for input(s): "CKTOTAL", "CKMB", "CKMBINDEX", "TROPONINI" in the last 168 hours. CBG: Recent  Labs  Lab 11/30/21 1857 11/30/21 1950 11/30/21 2315 12/01/21 0342 12/01/21 0739  GLUCAP 84 95 156* 82 104*    Iron Studies: No results for input(s): "IRON", "TIBC", "TRANSFERRIN", "FERRITIN" in the last 72 hours. Studies/Results: No results found.  aspirin  81 mg Oral Daily   atorvastatin  80 mg Oral Daily   Chlorhexidine Gluconate Cloth  6 each Topical Daily   darbepoetin (ARANESP) injection - DIALYSIS  100 mcg Intravenous Q Tue-HD   feeding supplement (OSMOLITE 1.5 CAL)  237 mL Per Tube QID   fentaNYL  1 patch Transdermal I62V   folic acid  1 mg Oral Daily   Gerhardt's butt cream   Topical Daily   heparin injection (subcutaneous)  5,000 Units Subcutaneous Q8H   insulin aspart  0-6 Units Subcutaneous Q4H   leptospermum manuka honey  1 Application Topical Daily   loperamide HCl  4 mg Oral TID   midodrine  10 mg Oral 3 times per day on Tue Thu Sat   multivitamin  1 tablet Oral QHS   mouth rinse  15 mL Mouth Rinse 4 times per day   pantoprazole  40 mg Oral BID   QUEtiapine  50 mg Oral QHS   sodium chloride flush  3 mL Intravenous Q12H  thiamine  100 mg Oral Daily    BMET    Component Value Date/Time   NA 135 12/01/2021 0012   NA 135 02/13/2021 0855   K 4.6 12/01/2021 0012   CL 96 (L) 12/01/2021 0012   CO2 27 12/01/2021 0012   GLUCOSE 111 (H) 12/01/2021 0012   BUN 24 (H) 12/01/2021 0012   BUN 9 02/13/2021 0855   CREATININE 5.51 (H) 12/01/2021 0012   CALCIUM 8.8 (L) 12/01/2021 0012   GFRNONAA 11 (L) 12/01/2021 0012   GFRAA >60 08/25/2019 2110   CBC    Component Value Date/Time   WBC 9.5 11/28/2021 0021   RBC 3.75 (L) 11/28/2021 0021   HGB 10.4 (L) 11/28/2021 0021   HGB 15.4 02/13/2021 0855   HCT 33.9 (L) 11/28/2021 0021   HCT 45.7 02/13/2021 0855   PLT 360 11/28/2021 0021   PLT 239 02/13/2021 0855   MCV 90.4 11/28/2021 0021   MCV 88 02/13/2021 0855   MCH 27.7 11/28/2021 0021   MCHC 30.7 11/28/2021 0021   RDW 16.2 (H) 11/28/2021 0021   RDW 11.3 (L)  02/13/2021 0855   LYMPHSABS 2.2 09/29/2021 1956   MONOABS 2.1 (H) 09/29/2021 1956   EOSABS 0.0 09/29/2021 1956   BASOSABS 0.0 09/29/2021 1956    Assessment/Plan:   ESRD - following ischemic ATN in setting of cardiogenic shock/cardiac arrest.  Started CRRT on 10/07/21 and transitioned to IHD on 10/31/21.  s/p LIJ [facility] 11/04/21 with IR. Currently on TTS schedule w/ midodrine support.  Financially cleared to start at [facility] TTS however dispo is still pending. Pain with HD in recliner which may be a limiting factor for dispo. Try recliner again as able. Clotted system on 10/21. May need to add heparin. 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  s/p ECMO.  Midodrine being weaned: currently on midodrine 10 mg tid on TTS sched. 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 Unstageable pressure ulcer of left buttock - wound care following.  May need more optimal pain control during dialysis if he is to sit in a chair

## 2021-12-01 NOTE — Progress Notes (Signed)
[address]       [place] [address]             [phone]      11 Days Post-Op Procedure(s) (LRB): G-Tube Placement (N/A) Subjective: Feels okay, had to stop dialysis early yesterday for uncertain reason  Objective: Vital signs in last 24 hours: Temp:  [97.8 F (36.6 C)-99 F (37.2 C)] 99 F (37.2 C) (10/22 0736) Pulse Rate:  [80-97] 97 (10/22 0736) Cardiac Rhythm: Normal sinus rhythm (10/22 0730) Resp:  [13-29] 23 (10/22 0736) BP: (102-132)/(63-77) 111/69 (10/22 0736) SpO2:  [95 %-99 %] 98 % (10/22 0736) Weight:  [84.8 kg-86.6 kg] 84.8 kg (10/22 0500)  Hemodynamic parameters for last 24 hours:    Intake/Output from previous day: 10/21 0701 - 10/22 0700 In: 540 [P.O.:540] Out: 1100  Intake/Output this shift: Total I/O In: 240 [P.O.:240] Out: -   General appearance: alert, cooperative, fatigued, and no distress Heart: regular rate and rhythm Lungs: Mildly diminished in the bases Abdomen: Soft, nontender Extremities: No edema Wound: Sternal wound continues to heal well with no active evidence of infection  Lab Results: No results for input(s): "WBC", "HGB", "HCT", "PLT" in the last 72 hours. BMET:  Recent Labs    11/30/21 0031 12/01/21 0012  NA 137 135  K 4.6 4.6  CL 97* 96*  CO2 26 27  GLUCOSE 140* 111*  BUN 32* 24*  CREATININE 6.42* 5.51*  CALCIUM 9.0 8.8*    PT/INR: No results for input(s): "LABPROT", "INR" in the last 72 hours. ABG    Component Value Date/Time   PHART 7.301 (L) 10/22/2021 2354   HCO3 19.3 (L) 10/22/2021 2354   TCO2 20 (L) 10/22/2021 2354   ACIDBASEDEF 7.0 (H) 10/22/2021 2354   O2SAT 71.7 11/02/2021 0439   CBG (last 3)  Recent Labs    11/30/21 2315 12/01/21 0342 12/01/21 0739  GLUCAP 156* 82 104*    Meds Scheduled Meds:  aspirin  81 mg Oral Daily   atorvastatin  80 mg Oral Daily   Chlorhexidine Gluconate Cloth  6 each Topical Daily   darbepoetin (ARANESP) injection - DIALYSIS  100 mcg  Intravenous Q Tue-HD   feeding supplement (OSMOLITE 1.5 CAL)  237 mL Per Tube QID   fentaNYL  1 patch Transdermal B44H   folic acid  1 mg Oral Daily   Gerhardt's butt cream   Topical Daily   heparin injection (subcutaneous)  5,000 Units Subcutaneous Q8H   insulin aspart  0-6 Units Subcutaneous Q4H   leptospermum manuka honey  1 Application Topical Daily   loperamide HCl  4 mg Oral TID   midodrine  10 mg Oral 3 times per day on Tue Thu Sat   multivitamin  1 tablet Oral QHS   mouth rinse  15 mL Mouth Rinse 4 times per day   pantoprazole  40 mg Oral BID   QUEtiapine  50 mg Oral QHS   sodium chloride flush  3 mL Intravenous Q12H   thiamine  100 mg Oral Daily   Continuous Infusions: PRN Meds:.acetaminophen, camphor-menthol, clonazePAM, dextrose, diphenhydrAMINE, iohexol, ipratropium-albuterol, metoprolol tartrate, ondansetron (ZOFRAN) IV, mouth rinse, [EXPIRED] oxyCODONE **FOLLOWED BY** oxyCODONE, polyvinyl alcohol, sodium chloride flush, white petrolatum  Xrays No results found.  Assessment/Plan: S/P Procedure(s) (LRB): G-Tube Placement (N/A)  1 afebrile, vital signs stable, sinus rhythm, on midodrine-advanced heart failure team assisting with management, no further A-fib 2 O2 sats good on room air 3 had dialysis yesterday, nephrology  managing renal issues 4 continues current diet plan 5 continues wound care to pressure ulcer and buttock region 6 will not have home support needed to complete CIR program and therefore bed search is underway for skilled nursing facility 7 continues routine pulmonary hygiene and rehab as able    LOS: 64 days    John Giovanni [facility] Pager [phone] 12/01/2021

## 2021-12-01 NOTE — Progress Notes (Signed)
Mobility Specialist: Progress Note   12/01/21 1625  Mobility  Activity Ambulated with assistance in hallway  Level of Assistance Moderate assist, patient does 50-74%  Assistive Device Front wheel walker  Distance Ambulated (ft) 300 ft (110'+110'+80')  Activity Response Tolerated well  Mobility Referral Yes  $Mobility charge 1 Mobility   Pt received in the bed and agreeable to mobility. Required minA with bed mobility and modA to stand. Stopped x2 for seated break secondary to general fatigue. Pt wheeled back to the room at the end of the session with call bell and phone in reach.   [place] Mobility Specialist Secure Chat Only

## 2021-12-02 LAB — RENAL FUNCTION PANEL
Albumin: 2.8 g/dL — ABNORMAL LOW (ref 3.5–5.0)
Anion gap: 13 (ref 5–15)
BUN: 34 mg/dL — ABNORMAL HIGH (ref 8–23)
CO2: 27 mmol/L (ref 22–32)
Calcium: 8.9 mg/dL (ref 8.9–10.3)
Chloride: 97 mmol/L — ABNORMAL LOW (ref 98–111)
Creatinine, Ser: 7 mg/dL — ABNORMAL HIGH (ref 0.61–1.24)
GFR, Estimated: 8 mL/min — ABNORMAL LOW (ref >60)
Glucose, Bld: 114 mg/dL — ABNORMAL HIGH (ref 70–99)
Phosphorus: 4 mg/dL (ref 2.5–4.6)
Potassium: 4.7 mmol/L (ref 3.5–5.1)
Sodium: 137 mmol/L (ref 135–145)

## 2021-12-02 LAB — MAGNESIUM: Magnesium: 1.7 mg/dL (ref 1.7–2.4)

## 2021-12-02 LAB — GLUCOSE, CAPILLARY
Glucose-Capillary: 92 mg/dL (ref 70–99)
Glucose-Capillary: 102 mg/dL — ABNORMAL HIGH (ref 70–99)
Glucose-Capillary: 114 mg/dL — ABNORMAL HIGH (ref 70–99)
Glucose-Capillary: 106 mg/dL — ABNORMAL HIGH (ref 70–99)
Glucose-Capillary: 117 mg/dL — ABNORMAL HIGH (ref 70–99)
Glucose-Capillary: 149 mg/dL — ABNORMAL HIGH (ref 70–99)

## 2021-12-02 MED ORDER — ORAL CARE MOUTH RINSE: 15.0000 mL | OROMUCOSAL | Status: DC | PRN

## 2021-12-02 NOTE — Progress Notes (Signed)
Physical Therapy Treatment Patient Details Name: [name] MRN: [id number] DOB: [birth year] Today's Date: 12/02/2021   History of Present Illness Pt is a 61 y.o. male admitted 09/28/21 with SOB, bradycardia, hypotension. Workup for bilateral pleural effusion, pericardial effusion s/p VATS with pericardial window 8/19. Cardiac arrest with tamponade 8/20, intubated with bedside mediastinal exploration, ECMO via fem access. Pt with mediastinal hemorrhage s/p reexploration 8/21. To OR 8/28 for washout, ECMO decannulation, Impella placement. CRRT initiated 8/28. Brief PEA arrest 8/29 with DCCV to NSR.  S/p chest closure 9/2. S/p trach and impella removed 9/7. S/p wound vac removal 9/11. Course complicated by bouts of hypotension and bradycardia. Off CRRT 9/19. Trach decannulated 9/25. Tunneled HD cath placement 9/25; iHD initiated. PMH includes CAD (s/p recent CABG 09/12/21), CKD2, DM, HTN, AKI, afib, CVA.    PT Comments    Pt very pleasant and reports difficulty sleeping and maintained pain from sacral wound. Pt able to state sternal precautions but continues to need cues to maintain with transfers and function. Pt with improving tolerance for gait with continued struggle with power up from sidelying and sit to stand. Will continue to follow.   HR 114 with gait BP 108/69    Recommendations for follow up therapy are one component of a multi-disciplinary discharge planning process, led by the attending physician.  Recommendations may be updated based on patient status, additional functional criteria and insurance authorization.  Follow Up Recommendations  Skilled nursing-short term rehab (<3 hours/day) Can patient physically be transported by private vehicle: Yes   Assistance Recommended at Discharge Frequent or constant Supervision/Assistance  Patient can return home with the following A lot of help with bathing/dressing/bathroom;A lot of help with walking and/or transfers;Direct supervision/assist  for financial management;Help with stairs or ramp for entrance;Direct supervision/assist for medications management;Assistance with cooking/housework;Assist for transportation   Equipment Recommendations  Rolling walker (2 wheels);BSC/3in1;Wheelchair (measurements PT)    Recommendations for Other Services       Precautions / Restrictions Precautions Precautions: Sternal;Fall;Other (comment) Precaution Comments: painful sacral/buttocks wound Restrictions Weight Bearing Restrictions: No     Mobility  Bed Mobility Overal bed mobility: Needs Assistance Bed Mobility: Sidelying to Sit   Sidelying to sit: Min assist       General bed mobility comments: Rt sidelying on arrival with min assist to lift trunk from surface    Transfers Overall transfer level: Needs assistance     Sit to Stand: Mod assist           General transfer comment: mod assist to rise from bed x 6 trials with practice and focus on anterior translation prior to rise with continuation during rise. Pt stood from chair x 1    Ambulation/Gait Ambulation/Gait assistance: Min assist, +2 safety/equipment Gait Distance (Feet): 140 Feet Assistive device: Rolling walker (2 wheels) Gait Pattern/deviations: Step-through pattern, Decreased stride length   Gait velocity interpretation: <1.8 ft/sec, indicate of risk for recurrent falls   General Gait Details: pt walked 140' x 2 trials with RW and seated rest between trials. narrow BOS with cues for posture and sequence   Stairs             Wheelchair Mobility    Modified Rankin (Stroke Patients Only)       Balance Overall balance assessment: Needs assistance   Sitting balance-Leahy Scale: Good     Standing balance support: Bilateral upper extremity supported, Reliant on assistive device for balance Standing balance-Leahy Scale: Poor Standing balance comment: reliant on RW for support  Cognition  Arousal/Alertness: Awake/alert Behavior During Therapy: Flat affect Overall Cognitive Status: Impaired/Different from baseline Area of Impairment: Memory, Safety/judgement                     Memory: Decreased recall of precautions Following Commands: Follows one step commands consistently     Problem Solving: Slow processing General Comments: mod cues for precautions        Exercises      General Comments        Pertinent Vitals/Pain Pain Assessment Pain Score: 4  Pain Location: sacral wound Pain Descriptors / Indicators: Discomfort, Aching Pain Intervention(s): Limited activity within patient's tolerance, Monitored during session, Repositioned    Home Living                          Prior Function            PT Goals (current goals can now be found in the care plan section) Progress towards PT goals: Progressing toward goals    Frequency    Min 3X/week      PT Plan Discharge plan needs to be updated    Co-evaluation              AM-PAC PT "6 Clicks" Mobility   Outcome Measure  Help needed turning from your back to your side while in a flat bed without using bedrails?: A Little Help needed moving from lying on your back to sitting on the side of a flat bed without using bedrails?: A Lot Help needed moving to and from a bed to a chair (including a wheelchair)?: A Lot Help needed standing up from a chair using your arms (e.g., wheelchair or bedside chair)?: A Lot Help needed to walk in hospital room?: A Little Help needed climbing 3-5 steps with a railing? : Total 6 Click Score: 13    End of Session Equipment Utilized During Treatment: Gait belt Activity Tolerance: Patient tolerated treatment well Patient left: with call bell/phone within reach;Other (comment) (on commode with NT aware) Nurse Communication: Mobility status PT Visit Diagnosis: Other abnormalities of gait and mobility (R26.89);Difficulty in walking, not elsewhere  classified (R26.2);Muscle weakness (generalized) (M62.81)     Time: 0810-0829 PT Time Calculation (min) (ACUTE ONLY): 19 min  Charges:  $Gait Training: 8-22 mins                     [name], PT Acute Rehabilitation Services Office: [place] 12/02/2021, 8:37 AM

## 2021-12-02 NOTE — Progress Notes (Addendum)
Patient ID: [name], male   DOB: [birth year], 61 y.o.   MRN: [id number]   Advanced Heart Failure Rounding Note   Subjective:    - 8/19 Pericardial window - 8/20 Cardiac arrest with tamponade -> Emergent bedside washout - 09/29/21 VA Cannulation - 09/30/21 Return to OR for mediastinal hemorrhage - 10/01/21 Developed AF -> amio - 10/02/21 TEE EF 25-30%  - 10/04/21 OR for washout. C/b continued bleeding overnight - 10/07/21 Placement of Impella 5.5 with washout, VA ECMO decannulation. Hypotensive with development of severe RV dysfunction after ECMO off and pressors titrated up. Multiple units of blood products. - 10/08/21 Brief PEA arrest. AFL with RVR >> S/p DCCV to SR, back in AFL shortly after - 8/31 Give 1UPRBCs  - 9/2 OR for chest closure - 9/3 Hypotensive overnight w/ SBPs in 80s. Febrile, mTemp 100.8. CRRT paused. VP increased to 0.04.  - 9/4 s/p bronchoscopy w/ BAL by PCCM, Cx NGTD  - 9/5 vomiting w/ large volume NGT output + watery/foul diarrhea, Tube feeds held. No signs of ileus on KUB. C-diff negative   - 9/7 OR for Impella Extraction and percutaneous tracheostomy. 2 u RBCs. - 9/8 CVVH stopped and line removed for holiday - 9/9 CVVH restarted.  -9/13 Worsening leukocytosis and pressor requirements. Purulent drainage from arterial line. Started Daptomycin, Micafungin and Meropenem. - 9/17 Daptomycin + Micafungin stopped. Remains on Meropenum  - 9/21 transitioned to iHD  - 9/25 Tunneled HD cath placed. Trach decannulation - 9/26 Hgb 6.1>>transfused 2uRBC  - 10/11 gtube placed   No complaints this morning, got good rest last night. Denies CP and SOB.   Objective:     Vital Signs:   Temp:  [98.2 F (36.8 C)-99 F (37.2 C)] 98.4 F (36.9 C) (10/23 0338) Pulse Rate:  [95-99] 95 (10/23 0338) Resp:  [18-26] 18 (10/23 0338) BP: (94-139)/(67-92) 139/92 (10/23 0338) SpO2:  [97 %-98 %] 98 % (10/23 0338) Weight:  [85.2 kg] 85.2 kg (10/23 0500) Last BM Date : 12/01/21  Weight  change: Filed Weights   11/30/21 1532 12/01/21 0500 12/02/21 0500  Weight: 86.6 kg 84.8 kg 85.2 kg    Intake/Output:   Intake/Output Summary (Last 24 hours) at 12/02/2021 0708 Last data filed at 12/01/2021 2005 Gross per 24 hour  Intake 595 ml  Output --  Net 595 ml    General:  well appearing.  No respiratory difficulty HEENT: normal Neck: supple. JVD ~8 cm. Carotids 2+ bilat; no bruits. No lymphadenopathy or thyromegaly appreciated. Cor: PMI nondisplaced. Regular rate & rhythm. No rubs, gallops or murmurs. LIJ HD cath.  Lungs: clear Abdomen: soft, nontender, nondistended. No hepatosplenomegaly. No bruits or masses. Good bowel sounds. Extremities: no cyanosis, clubbing, rash, edema  Neuro: alert & oriented x 3, cranial nerves grossly intact. moves all 4 extremities w/o difficulty. Affect pleasant.   Telemetry: NSR 90s (Personally reviewed)    Labs: Basic Metabolic Panel: Recent Labs  Lab 11/28/21 0021 11/29/21 0019 11/30/21 0031 12/01/21 0012 12/02/21 0019  NA 135 135 137 135 137  K 4.7 4.9 4.6 4.6 4.7  CL 96* 96* 97* 96* 97*  CO2 _0 GLUCOSE 95 69* 140* 111* 114*  BUN 34* 22 32* 24* 34*  CREATININE 6.08* 4.71* 6.42* 5.51* 7.00*  CALCIUM 8.9 8.8* 9.0 8.8* 8.9  MG 1.8 1.7 1.6* 1.6* 1.7  PHOS 3.1 2.9 3.8 3.2 4.0    Liver Function Tests: Recent Labs  Lab 11/28/21 0021 11/29/21 0019 11/30/21 0031  12/01/21 0012 12/02/21 0019  ALBUMIN 2.8* 2.9* 2.7* 2.7* 2.8*   No results for input(s): "LIPASE", "AMYLASE" in the last 168 hours. No results for input(s): "AMMONIA" in the last 168 hours.  CBC: Recent Labs  Lab 11/26/21 0013 11/28/21 0021  WBC 8.4 9.5  HGB 10.5* 10.4*  HCT 34.0* 33.9*  MCV 90.7 90.4  PLT 398 360    Cardiac Enzymes: No results for input(s): "CKTOTAL", "CKMB", "CKMBINDEX", "TROPONINI" in the last 168 hours.   BNP: BNP (last 3 results) Recent Labs    12/24/20 0651 09/28/21 0405  BNP 412.0* 826.6*    ProBNP (last 3  results) No results for input(s): "PROBNP" in the last 8760 hours.  Other results:  Imaging: No results found.   Medications:     Scheduled Medications:  aspirin  81 mg Oral Daily   atorvastatin  80 mg Oral Daily   Chlorhexidine Gluconate Cloth  6 each Topical Daily   darbepoetin (ARANESP) injection - DIALYSIS  100 mcg Intravenous Q Tue-HD   feeding supplement (OSMOLITE 1.5 CAL)  237 mL Per Tube QID   fentaNYL  1 patch Transdermal U13K   folic acid  1 mg Oral Daily   Gerhardt's butt cream   Topical Daily   heparin injection (subcutaneous)  5,000 Units Subcutaneous Q8H   insulin aspart  0-6 Units Subcutaneous Q4H   leptospermum manuka honey  1 Application Topical Daily   loperamide HCl  4 mg Oral TID   midodrine  10 mg Oral 3 times per day on Tue Thu Sat   multivitamin  1 tablet Oral QHS   mouth rinse  15 mL Mouth Rinse 4 times per day   pantoprazole  40 mg Oral BID   QUEtiapine  50 mg Oral QHS   sodium chloride flush  3 mL Intravenous Q12H   thiamine  100 mg Oral Daily    Infusions:  PRN Medications: acetaminophen, camphor-menthol, clonazePAM, dextrose, diphenhydrAMINE, iohexol, ipratropium-albuterol, metoprolol tartrate, ondansetron (ZOFRAN) IV, mouth rinse, mouth rinse, [EXPIRED] oxyCODONE **FOLLOWED BY** oxyCODONE, polyvinyl alcohol, sodium chloride flush, white petrolatum   Assessment/Plan:    1. Shock - mixed cardiogenic/hemorrhagic -> VA ECMO -> decannulated on 8/28 to Impella 5.5 - Echo 08/28: Underfilled LV, EF 55-60% with severe LVH and near normal RV systsystolic function.  - Impella extracted 9/7 - Bedside echo 09/13 - LV function preserved, RV mildly reduced. Small clot in posterior pericardium but no hemodynamic effect  - Completed total of 14 days of Micafungin (ended 9/27) - Off pressors.  - Stable on lower dose of midodrine on HD days.    2. Cardiac arrest (PEA/bradycardic) - 8/19 and 8/28 in setting of tamponade - resolved  3. Cardiac tamponade  with emergent bedside sternotomy  - Diffuse epicardial bleeding with post-op Dresslers - return to OR 8/21 and 8/24 for washouts.  - Washout 8/28 in OR. Multiple units of blood products in OR.   - Chest closed on 9/2  4. Acute hypoxemic respiratory failure - Off ECMO.  - Perc Trach placed 9/7  - s/p bronchoscopy w/ BAL 9/4.  - tracheal aspirate 09/13 growing few GPC and rare yeast  - Started back on IV abx as above d/t concern for sepsis.  - Trach decannulated 9/25  - Resolved. On room air.   5. AKI due to ATN - CRRT started 08/28.  - Remains anuric - Transitioned to [facility] 9/21. Nephrology following  - s/p [facility] cath by IR   - tolerating iHD (TTS schedule).  Tolerating HD in chair now  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 Now off colchicine   7. 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 Continue ASA 81 mg daily  8. DM2 - continue SSI   9. PAF/AFL - Tolerates poorly.  - Recurrent AFL. S/p DCCV to SR 08/29.  - Off amio d/t bradycardia - Not on [facility] for now d/t bleeding, DVT dose heparin.  - In SR  10. ID - Completed course of abx with open chest.   11. FEN - Underwent gastrostomy tube placement 10/11 - Modified Barium Swallow 10/13: now tolerating Dysphagia 3 diet.  - Getting bolus feeds.  - SLP + RD following.   12. Hyperkalemia - Resolved.   13. Unstageable Pressure Ulcer, Buttock  - WOC following.  - Started on hydrotherapy.   47. Fall - management per primary team  15. Diarrhea - mgmt per primary - Imodium, some improvement  Outpatient HD chair approved.  PT/OT following. [facility] pending medicaid approval. HF TOC CM assisting with disposition.  Earnie Larsson, AGACNP-BC  7:08 AM  Patient seen and examined with the above-signed Advanced Practice Provider and/or Housestaff. I personally reviewed laboratory data, imaging studies and relevant notes. I independently examined the patient and formulated the important aspects of  the plan. I have edited the note to reflect any of my changes or salient points. I have personally discussed the plan with the patient and/or family.  Doing well. Remains in NSR. BP stable. Volume status controlled with HD. Tolerating diet  General:  Sitting up No resp difficulty HEENT: normal Neck: supple. no JVD. Carotids 2+ bilat; no bruits. No lymphadenopathy or thryomegaly appreciated. Cor: PMI nondisplaced. Regular rate & rhythm. No rubs, gallops or murmurs. Lungs: clear Abdomen: soft, nontender, nondistended. No hepatosplenomegaly. No bruits or masses. Good bowel sounds. + G-tube Extremities: no cyanosis, clubbing, rash, edema Neuro: alert & orientedx3, cranial nerves grossly intact. RUE weak  Affect pleasant  Stable from a cardiac perspective. No new recs. We will follow from a distance. Please call with questions.   Glori Bickers, MD  8:38 AM

## 2021-12-02 NOTE — Progress Notes (Signed)
Inpatient Rehab Admissions Coordinator:    I spoke with Pt.'s spouse Collette and confirmed that she does not feel family is able to assist at d/c after CIR and that they want SNF. CIR will sign off.  Clemens Catholic, [place], [place] Admissions Coordinator  [phone] ([place]) [phone] (office)

## 2021-12-02 NOTE — TOC CM/SW Note (Signed)
HF TOC CM spoke to pt's wife and she is working with Medicaid CW to assist with getting Medicaid approved. She has Durable POA completed. Explained pt will be dc tomorrow to SNF rehab. States he may have a friend that will provide transportation to facility. [place], Heart Failure TOC CM [phone]

## 2021-12-02 NOTE — Progress Notes (Signed)
Orthopedic Tech Progress Note Patient Details:  [name] [birth year] [phone]  RN stated patient has brace in room   Patient ID: [name], male   DOB: 1961/02/03, 61 y.o.   MRN: [id number]  Janit Pagan 12/02/2021, 11:19 AM

## 2021-12-02 NOTE — Progress Notes (Addendum)
      [address]       [place] [id number]             [phone]      12 Days Post-Op Procedure(s) (LRB): G-Tube Placement (N/A)  Subjective:  Patient continues to complain of pain along his bottom.  He states they do not have plans to do dialysis today.  Objective: Vital signs in last 24 hours: Temp:  [98.2 F (36.8 C)-98.9 F (37.2 C)] 98.6 F (37 C) (10/23 0736) Pulse Rate:  [95-114] 114 (10/23 0832) Cardiac Rhythm: Normal sinus rhythm (10/22 2005) Resp:  [18-26] 20 (10/23 0736) BP: (94-139)/(67-92) 108/69 (10/23 0832) SpO2:  [97 %-98 %] 98 % (10/23 0832) Weight:  [85.2 kg] 85.2 kg (10/23 0500)  Intake/Output from previous day: 10/22 0701 - 10/23 0700 In: 835 [P.O.:835] Out: -   General appearance: alert, cooperative, and no distress Heart: regular rate and rhythm Lungs: clear to auscultation bilaterally Abdomen: soft, non-tender; bowel sounds normal; no masses,  no organomegaly Extremities: extremities normal, atraumatic, no cyanosis or edema Wound: clean, improving, no evidence of infections  Lab Results: No results for input(s): "WBC", "HGB", "HCT", "PLT" in the last 72 hours. BMET:  Recent Labs    12/01/21 0012 12/02/21 0019  NA 135 137  K 4.6 4.7  CL 96* 97*  CO2 27 27  GLUCOSE 111* 114*  BUN 24* 34*  CREATININE 5.51* 7.00*  CALCIUM 8.8* 8.9    PT/INR: No results for input(s): "LABPROT", "INR" in the last 72 hours. ABG    Component Value Date/Time   PHART 7.301 (L) 10/22/2021 2354   HCO3 19.3 (L) 10/22/2021 2354   TCO2 20 (L) 10/22/2021 2354   ACIDBASEDEF 7.0 (H) 10/22/2021 2354   O2SAT 71.7 11/02/2021 0439   CBG (last 3)  Recent Labs    12/01/21 2325 12/02/21 0324 12/02/21 0728  GLUCAP 154* 92 102*    Assessment/Plan: S/P Procedure(s) (LRB): G-Tube Placement (N/A)  CV- maintaining NSR, labile BP- on Midodrine 10 mg TID Pulm- no acute issues, off oxygen, continue IS Renal- ESRD, dialysis clotted off on Sat,  no plans for dialysis today, will resume tomorrow as scheduled GI- continue current diet, oral intake is improving Sacral wound improving, continue wound care Deconditioning- unfortunately doesn't have help at time of discharge for CIR, will need SNF placement, has a bed at [facility], if still available plan for d/c tomorrow after dialysis vs. Wednesday   LOS: 65 days    Ellwood Handler, PA-C 12/02/2021   Chart reviewed, patient examined, agree with above. He feels well except for pain from sacral pressure sore. Taking po fairly well and getting bolus feedings of Osmolite. I think he is stable enough to go to SNF.

## 2021-12-02 NOTE — Progress Notes (Signed)
OT Cancellation Note  Patient Details Name: [name] MRN: [id number] DOB: September 11, 1960   Cancelled Treatment:    Reason Eval/Treat Not Completed: Patient declined, no reason specified (OT to continue efforts at pt allows.)  Elliot Cousin 12/02/2021, 11:20 AM

## 2021-12-02 NOTE — Plan of Care (Signed)
Problem: Education: Goal: Will demonstrate proper wound care and an understanding of methods to prevent future damage 12/02/2021 2343 by Shanon Ace, RN Outcome: Progressing 12/02/2021 2343 by Shanon Ace, RN Outcome: Progressing Goal: Knowledge of disease or condition will improve 12/02/2021 2343 by Shanon Ace, RN Outcome: Progressing 12/02/2021 2343 by Shanon Ace, RN Outcome: Progressing Goal: Knowledge of the prescribed therapeutic regimen will improve 12/02/2021 2343 by Shanon Ace, RN Outcome: Progressing 12/02/2021 2343 by Shanon Ace, RN Outcome: Progressing Goal: Individualized Educational Video(s) 12/02/2021 2343 by Shanon Ace, RN Outcome: Progressing 12/02/2021 2343 by Shanon Ace, RN Outcome: Progressing   Problem: Activity: Goal: Risk for activity intolerance will decrease 12/02/2021 2343 by Shanon Ace, RN Outcome: Progressing 12/02/2021 2343 by Shanon Ace, RN Outcome: Progressing   Problem: Cardiac: Goal: Will achieve and/or maintain hemodynamic stability 12/02/2021 2343 by Shanon Ace, RN Outcome: Progressing 12/02/2021 2343 by Shanon Ace, RN Outcome: Progressing   Problem: Clinical Measurements: Goal: Postoperative complications will be avoided or minimized 12/02/2021 2343 by Shanon Ace, RN Outcome: Progressing 12/02/2021 2343 by Shanon Ace, RN Outcome: Progressing   Problem: Respiratory: Goal: Respiratory status will improve 12/02/2021 2343 by Shanon Ace, RN Outcome: Progressing 12/02/2021 2343 by Shanon Ace, RN Outcome: Progressing   Problem: Skin Integrity: Goal: Wound healing without signs and symptoms of infection 12/02/2021 2343 by Shanon Ace, RN Outcome: Progressing 12/02/2021 2343 by Shanon Ace, RN Outcome: Progressing Goal: Risk for impaired skin integrity will decrease 12/02/2021 2343 by Shanon Ace, RN Outcome: Progressing 12/02/2021 2343 by Shanon Ace, RN Outcome:  Progressing   Problem: Urinary Elimination: Goal: Ability to achieve and maintain adequate renal perfusion and functioning will improve 12/02/2021 2343 by Shanon Ace, RN Outcome: Progressing 12/02/2021 2343 by Shanon Ace, RN Outcome: Progressing   Problem: Education: Goal: Understanding of cardiac disease, CV risk reduction, and recovery process will improve 12/02/2021 2343 by Shanon Ace, RN Outcome: Progressing 12/02/2021 2343 by Shanon Ace, RN Outcome: Progressing Goal: Individualized Educational Video(s) 12/02/2021 2343 by Shanon Ace, RN Outcome: Progressing 12/02/2021 2343 by Shanon Ace, RN Outcome: Progressing   Problem: Activity: Goal: Ability to tolerate increased activity will improve 12/02/2021 2343 by Shanon Ace, RN Outcome: Progressing 12/02/2021 2343 by Shanon Ace, RN Outcome: Progressing   Problem: Cardiac: Goal: Ability to achieve and maintain adequate cardiovascular perfusion will improve 12/02/2021 2343 by Shanon Ace, RN Outcome: Progressing 12/02/2021 2343 by Shanon Ace, RN Outcome: Progressing   Problem: Health Behavior/Discharge Planning: Goal: Ability to safely manage health-related needs after discharge will improve 12/02/2021 2343 by Shanon Ace, RN Outcome: Progressing 12/02/2021 2343 by Shanon Ace, RN Outcome: Progressing   Problem: Education: Goal: Knowledge of General Education information will improve Description: Including pain rating scale, medication(s)/side effects and non-pharmacologic comfort measures 12/02/2021 2343 by Shanon Ace, RN Outcome: Progressing 12/02/2021 2343 by Shanon Ace, RN Outcome: Progressing   Problem: Health Behavior/Discharge Planning: Goal: Ability to manage health-related needs will improve 12/02/2021 2343 by Shanon Ace, RN Outcome: Progressing 12/02/2021 2343 by Shanon Ace, RN Outcome: Progressing   Problem: Clinical Measurements: Goal: Ability to maintain  clinical measurements within normal limits will improve 12/02/2021 2343 by Shanon Ace, RN Outcome: Progressing 12/02/2021 2343 by Shanon Ace, RN Outcome: Progressing Goal: Will remain free from infection 12/02/2021 2343 by Shanon Ace, RN Outcome: Progressing 12/02/2021 2343 by Shanon Ace, RN Outcome: Progressing Goal: Diagnostic test results will improve 12/02/2021 2343 by Shanon Ace, RN Outcome: Progressing 12/02/2021 2343 by Shanon Ace, RN Outcome: Progressing Goal: Respiratory complications will improve 12/02/2021 2343 by Shanon Ace,  RN Outcome: Progressing 12/02/2021 2343 by Shanon Ace, RN Outcome: Progressing Goal: Cardiovascular complication will be avoided 12/02/2021 2343 by Shanon Ace, RN Outcome: Progressing 12/02/2021 2343 by Shanon Ace, RN Outcome: Progressing   Problem: Activity: Goal: Risk for activity intolerance will decrease 12/02/2021 2343 by Shanon Ace, RN Outcome: Progressing 12/02/2021 2343 by Shanon Ace, RN Outcome: Progressing   Problem: Nutrition: Goal: Adequate nutrition will be maintained 12/02/2021 2343 by Shanon Ace, RN Outcome: Progressing 12/02/2021 2343 by Shanon Ace, RN Outcome: Progressing   Problem: Coping: Goal: Level of anxiety will decrease 12/02/2021 2343 by Shanon Ace, RN Outcome: Progressing 12/02/2021 2343 by Shanon Ace, RN Outcome: Progressing   Problem: Elimination: Goal: Will not experience complications related to bowel motility 12/02/2021 2343 by Shanon Ace, RN Outcome: Progressing 12/02/2021 2343 by Shanon Ace, RN Outcome: Progressing Goal: Will not experience complications related to urinary retention 12/02/2021 2343 by Shanon Ace, RN Outcome: Progressing 12/02/2021 2343 by Shanon Ace, RN Outcome: Progressing   Problem: Pain Managment: Goal: General experience of comfort will improve 12/02/2021 2343 by Shanon Ace, RN Outcome:  Progressing 12/02/2021 2343 by Shanon Ace, RN Outcome: Progressing   Problem: Safety: Goal: Ability to remain free from injury will improve 12/02/2021 2343 by Shanon Ace, RN Outcome: Progressing 12/02/2021 2343 by Shanon Ace, RN Outcome: Progressing   Problem: Skin Integrity: Goal: Risk for impaired skin integrity will decrease 12/02/2021 2343 by Shanon Ace, RN Outcome: Progressing 12/02/2021 2343 by Shanon Ace, RN Outcome: Progressing   Problem: Education: Goal: Ability to describe self-care measures that may prevent or decrease complications (Diabetes Survival Skills Education) will improve 12/02/2021 2343 by Shanon Ace, RN Outcome: Progressing 12/02/2021 2343 by Shanon Ace, RN Outcome: Progressing Goal: Individualized Educational Video(s) 12/02/2021 2343 by Shanon Ace, RN Outcome: Progressing 12/02/2021 2343 by Shanon Ace, RN Outcome: Progressing   Problem: Coping: Goal: Ability to adjust to condition or change in health will improve 12/02/2021 2343 by Shanon Ace, RN Outcome: Progressing 12/02/2021 2343 by Shanon Ace, RN Outcome: Progressing   Problem: Fluid Volume: Goal: Ability to maintain a balanced intake and output will improve 12/02/2021 2343 by Shanon Ace, RN Outcome: Progressing 12/02/2021 2343 by Shanon Ace, RN Outcome: Progressing   Problem: Health Behavior/Discharge Planning: Goal: Ability to identify and utilize available resources and services will improve 12/02/2021 2343 by Shanon Ace, RN Outcome: Progressing 12/02/2021 2343 by Shanon Ace, RN Outcome: Progressing Goal: Ability to manage health-related needs will improve 12/02/2021 2343 by Shanon Ace, RN Outcome: Progressing 12/02/2021 2343 by Shanon Ace, RN Outcome: Progressing   Problem: Metabolic: Goal: Ability to maintain appropriate glucose levels will improve 12/02/2021 2343 by Shanon Ace, RN Outcome: Progressing 12/02/2021 2343 by  Shanon Ace, RN Outcome: Progressing   Problem: Nutritional: Goal: Maintenance of adequate nutrition will improve 12/02/2021 2343 by Shanon Ace, RN Outcome: Progressing 12/02/2021 2343 by Shanon Ace, RN Outcome: Progressing Goal: Progress toward achieving an optimal weight will improve 12/02/2021 2343 by Shanon Ace, RN Outcome: Progressing 12/02/2021 2343 by Shanon Ace, RN Outcome: Progressing   Problem: Skin Integrity: Goal: Risk for impaired skin integrity will decrease 12/02/2021 2343 by Shanon Ace, RN Outcome: Progressing 12/02/2021 2343 by Shanon Ace, RN Outcome: Progressing   Problem: Tissue Perfusion: Goal: Adequacy of tissue perfusion will improve 12/02/2021 2343 by Shanon Ace, RN Outcome: Progressing 12/02/2021 2343 by Shanon Ace, RN Outcome: Progressing   Problem: Activity: Goal: Ability to tolerate increased activity will improve 12/02/2021 2343 by Shanon Ace, RN Outcome: Progressing 12/02/2021 2343 by Shanon Ace, RN Outcome: Progressing  Problem: Health Behavior/Discharge Planning: Goal: Ability to manage tracheostomy will improve 12/02/2021 2343 by Shanon Ace, RN Outcome: Progressing 12/02/2021 2343 by Shanon Ace, RN Outcome: Progressing

## 2021-12-02 NOTE — Progress Notes (Addendum)
Mobility Specialist Progress Note    12/02/21 1454  Mobility  Activity Ambulated with assistance to bathroom  Level of Assistance Minimal assist, patient does 75% or more  Assistive Device Front wheel walker  Distance Ambulated (ft) 20 ft (10+10)  Activity Response Tolerated well  Mobility Referral Yes  $Mobility charge 1 Mobility   Pt received requesting to attempt BM. Only flatus. Required cues for hand placement to maintain sternal precautions but left hands placed on RW. Declined further ambulation so returned to bed with call bell in reach.   Hildred Alamin Mobility Specialist  Secure Chat Only

## 2021-12-02 NOTE — Plan of Care (Signed)
  Problem: Activity: Goal: Risk for activity intolerance will decrease Outcome: Progressing   Problem: Respiratory: Goal: Respiratory status will improve Outcome: Progressing   Problem: Activity: Goal: Ability to tolerate increased activity will improve Outcome: Progressing   Problem: Clinical Measurements: Goal: Respiratory complications will improve Outcome: Progressing Goal: Cardiovascular complication will be avoided Outcome: Progressing   Problem: Nutrition: Goal: Adequate nutrition will be maintained Outcome: Progressing   Problem: Elimination: Goal: Will not experience complications related to bowel motility Outcome: Progressing   Problem: Pain Managment: Goal: General experience of comfort will improve Outcome: Progressing   Problem: Elimination: Goal: Will not experience complications related to urinary retention Outcome: Not Progressing

## 2021-12-02 NOTE — TOC Progression Note (Signed)
Transition of Care [facility]) - Progression Note    Patient Details  Name: [name] MRN: [id number] Date of Birth: 1960/10/21  Transition of Care [facility]) CM/SW [place], [place] Phone Number: 12/02/2021, 2:29 PM  Clinical Narrative:     CSW notified [facility] of DC tomorrow. CSW updated [place] [facility] [place] clinic and chair time details. LOG to be faxed to [phone]. RNCM has updated pt's ex wife who explained they will have a friend transport pt at DC.   Expected Discharge Plan: Skilled Nursing Facility Barriers to Discharge: Continued Medical Work up, No SNF bed, SNF Pending Medicaid  Expected Discharge Plan and Services Expected Discharge Plan: [place]                                               Social Determinants of Health (SDOH) Interventions    Readmission Risk Interventions    09/20/2021   11:57 AM  Readmission Risk Prevention Plan  Post Dischage Appt Complete  Medication Screening Complete  Transportation Screening Complete

## 2021-12-02 NOTE — Progress Notes (Signed)
Patient ID: [name], male   DOB: [birth year], 61 y.o.   MRN: [id number] S: Sleeping comfortably this AM.  No new issues per chart.  On HD TTS schedule.    O:BP (!) 139/92 (BP Location: Right Arm)   Pulse 95   Temp 98.4 F (36.9 C) (Oral)   Resp 18   Ht 5\' 7"  (1.702 m)   Wt 85.2 kg   SpO2 98%   BMI 29.42 kg/m   Intake/Output Summary (Last 24 hours) at 12/02/2021 0640 Last data filed at 12/01/2021 2005 Gross per 24 hour  Intake 835 ml  Output --  Net 835 ml    Intake/Output: I/O last 3 completed shifts: In: 1517 [P.O.:1135] Out: 1100 [Other:1100]  Intake/Output this shift:  Total I/O In: 240 [P.O.:240] Out: -  Weight change: -1.437 kg Gen: NAD,sleeping, laying on side in bed  CVS: Normal rate Resp: Bilateral chest rise with no increased work of breathing Abd: soft, nondistended Ext: no edema, warm and well-perfused Neuro: awake/alert Dialysis access: [facility]  Recent Labs  Lab 11/26/21 0013 11/27/21 0014 11/28/21 0021 11/29/21 0019 11/30/21 0031 12/01/21 0012 12/02/21 0019  NA 133* 136 135 135 137 135 137  K 4.2 4.1 4.7 4.9 4.6 4.6 4.7  CL 93* 95* 96* 96* 97* 96* 97*  CO2 27 29 27 25 26 27 27   GLUCOSE 115* 87 95 69* 140* 111* 114*  BUN 40* 22 34* 22 32* 24* 34*  CREATININE 6.77* 4.51* 6.08* 4.71* 6.42* 5.51* 7.00*  ALBUMIN 2.9* 2.8* 2.8* 2.9* 2.7* 2.7* 2.8*  CALCIUM 8.8* 8.5* 8.9 8.8* 9.0 8.8* 8.9  PHOS 5.5* 3.3 3.1 2.9 3.8 3.2 4.0    Liver Function Tests: Recent Labs  Lab 11/30/21 0031 12/01/21 0012 12/02/21 0019  ALBUMIN 2.7* 2.7* 2.8*    No results for input(s): "LIPASE", "AMYLASE" in the last 168 hours. No results for input(s): "AMMONIA" in the last 168 hours. CBC: Recent Labs  Lab 11/25/21 0643 11/26/21 0013 11/28/21 0021  WBC 9.3 8.4 9.5  HGB 10.3* 10.5* 10.4*  HCT 33.1* 34.0* 33.9*  MCV 90.9 90.7 90.4  PLT 376 398 360    Cardiac Enzymes: No results for input(s): "CKTOTAL", "CKMB", "CKMBINDEX", "TROPONINI" in the last 168  hours. CBG: Recent Labs  Lab 12/01/21 1116 12/01/21 1625 12/01/21 1921 12/01/21 2325 12/02/21 0324  GLUCAP 170* 99 152* 154* 92     Iron Studies: No results for input(s): "IRON", "TIBC", "TRANSFERRIN", "FERRITIN" in the last 72 hours. Studies/Results: No results found.  aspirin  81 mg Oral Daily   atorvastatin  80 mg Oral Daily   Chlorhexidine Gluconate Cloth  6 each Topical Daily   darbepoetin (ARANESP) injection - DIALYSIS  100 mcg Intravenous Q Tue-HD   feeding supplement (OSMOLITE 1.5 CAL)  237 mL Per Tube QID   fentaNYL  1 patch Transdermal O16W   folic acid  1 mg Oral Daily   Gerhardt's butt cream   Topical Daily   heparin injection (subcutaneous)  5,000 Units Subcutaneous Q8H   insulin aspart  0-6 Units Subcutaneous Q4H   leptospermum manuka honey  1 Application Topical Daily   loperamide HCl  4 mg Oral TID   midodrine  10 mg Oral 3 times per day on Tue Thu Sat   multivitamin  1 tablet Oral QHS   mouth rinse  15 mL Mouth Rinse 4 times per day   pantoprazole  40 mg Oral BID   QUEtiapine  50 mg Oral QHS  sodium chloride flush  3 mL Intravenous Q12H   thiamine  100 mg Oral Daily    BMET    Component Value Date/Time   NA 137 12/02/2021 0019   NA 135 02/13/2021 0855   K 4.7 12/02/2021 0019   CL 97 (L) 12/02/2021 0019   CO2 27 12/02/2021 0019   GLUCOSE 114 (H) 12/02/2021 0019   BUN 34 (H) 12/02/2021 0019   BUN 9 02/13/2021 0855   CREATININE 7.00 (H) 12/02/2021 0019   CALCIUM 8.9 12/02/2021 0019   GFRNONAA 8 (L) 12/02/2021 0019   GFRAA >60 08/25/2019 2110   CBC    Component Value Date/Time   WBC 9.5 11/28/2021 0021   RBC 3.75 (L) 11/28/2021 0021   HGB 10.4 (L) 11/28/2021 0021   HGB 15.4 02/13/2021 0855   HCT 33.9 (L) 11/28/2021 0021   HCT 45.7 02/13/2021 0855   PLT 360 11/28/2021 0021   PLT 239 02/13/2021 0855   MCV 90.4 11/28/2021 0021   MCV 88 02/13/2021 0855   MCH 27.7 11/28/2021 0021   MCHC 30.7 11/28/2021 0021   RDW 16.2 (H) 11/28/2021 0021    RDW 11.3 (L) 02/13/2021 0855   LYMPHSABS 2.2 09/29/2021 1956   MONOABS 2.1 (H) 09/29/2021 1956   EOSABS 0.0 09/29/2021 1956   BASOSABS 0.0 09/29/2021 1956    Assessment/Plan:   ESRD - following ischemic ATN in setting of cardiogenic shock/cardiac arrest.  Started CRRT on 10/07/21 and transitioned to IHD on 10/31/21.  s/p [facility] 11/04/21 with IR. Currently on TTS schedule w/ midodrine support.  Financially cleared to start at [facility] TTS however dispo is still pending. Pain with HD in recliner which may be a limiting factor for dispo. Try recliner again as able. Clotted system on 10/21. May need to add heparin.  Will do next HD 10/24 as scheduled.   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  s/p ECMO.  Midodrine being weaned: currently on midodrine 10 mg tid on TTS sched. 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 Unstageable pressure ulcer of left buttock - wound care following.  May need more optimal pain control during dialysis if he is to sit in a chair

## 2021-12-02 NOTE — Progress Notes (Addendum)
Contacted by CSW that pt may d/c to snf tomorrow. Contacted inpt HD unit to request that pt receive HD first shift tomorrow if possible and to also receive HD in chair if possible. Inpt HD staff report pt has received several treatments in the chair already but will attempt again tomorrow (MD ordered as well). Contacted [facility] and [facility] admissions to verify pt can start at [facility] on Thursday if pt is d/c tomorrow. Awaiting response.   Melven Sartorius Renal Navigator [phone]  Addendum at 3:43 pm: Pt can start at [facility] on Thursday. Pt will need to arrive at 11:45 to complete paperwork prior to 12:40 chair time. Update provided to [facility] staff. Clinic inquiring if pt can transfer from w/c to HD chair or if pt will require a hoyer lift transfer. Will await response from rehab staff.

## 2021-12-03 LAB — MAGNESIUM: Magnesium: 1.7 mg/dL (ref 1.7–2.4)

## 2021-12-03 LAB — GLUCOSE, CAPILLARY
Glucose-Capillary: 100 mg/dL — ABNORMAL HIGH (ref 70–99)
Glucose-Capillary: 95 mg/dL (ref 70–99)
Glucose-Capillary: 98 mg/dL (ref 70–99)

## 2021-12-03 LAB — RENAL FUNCTION PANEL
Albumin: 2.7 g/dL — ABNORMAL LOW (ref 3.5–5.0)
Anion gap: 13 (ref 5–15)
BUN: 43 mg/dL — ABNORMAL HIGH (ref 8–23)
CO2: 28 mmol/L (ref 22–32)
Calcium: 9 mg/dL (ref 8.9–10.3)
Chloride: 97 mmol/L — ABNORMAL LOW (ref 98–111)
Creatinine, Ser: 8.57 mg/dL — ABNORMAL HIGH (ref 0.61–1.24)
GFR, Estimated: 7 mL/min — ABNORMAL LOW (ref >60)
Glucose, Bld: 116 mg/dL — ABNORMAL HIGH (ref 70–99)
Phosphorus: 4.2 mg/dL (ref 2.5–4.6)
Potassium: 5.3 mmol/L — ABNORMAL HIGH (ref 3.5–5.1)
Sodium: 138 mmol/L (ref 135–145)

## 2021-12-03 MED ORDER — MIDODRINE HCL 10 MG PO TABS: 10.0000 mg | ORAL_TABLET | Freq: Three times a day (TID) | ORAL | Status: DC

## 2021-12-03 MED ORDER — VITAMIN B-1 100 MG PO TABS: 100.0000 mg | ORAL_TABLET | Freq: Every day | ORAL | Status: DC

## 2021-12-03 MED ORDER — ACETAMINOPHEN 325 MG PO TABS: 650.0000 mg | ORAL_TABLET | Freq: Four times a day (QID) | ORAL | Status: DC | PRN

## 2021-12-03 MED ORDER — INSULIN ASPART 100 UNIT/ML IJ SOLN: 0.0000 [IU] | INTRAMUSCULAR | 11 refills | Status: DC

## 2021-12-03 MED ORDER — QUETIAPINE FUMARATE 50 MG PO TABS: 50.0000 mg | ORAL_TABLET | Freq: Every day | ORAL | 0 refills | Status: DC

## 2021-12-03 MED ORDER — PANTOPRAZOLE SODIUM 40 MG PO TBEC: 40.0000 mg | DELAYED_RELEASE_TABLET | Freq: Two times a day (BID) | ORAL | Status: DC

## 2021-12-03 MED ORDER — MEDIHONEY WOUND/BURN DRESSING EX PSTE: 1.0000 | PASTE | Freq: Every day | CUTANEOUS | Status: DC

## 2021-12-03 MED ORDER — OSMOLITE 1.5 CAL PO LIQD: 237.0000 mL | Freq: Four times a day (QID) | ORAL | 0 refills | Status: DC

## 2021-12-03 MED ORDER — RENA-VITE PO TABS: 1.0000 | ORAL_TABLET | Freq: Every day | ORAL | 0 refills | Status: DC

## 2021-12-03 MED ORDER — HEPARIN SODIUM (PORCINE) 1000 UNIT/ML IJ SOLN
INTRAMUSCULAR | Status: AC
  Administered 2021-12-03: 3800 [IU]
  Filled 2021-12-03: qty 4

## 2021-12-03 MED ORDER — FOLIC ACID 1 MG PO TABS: 1.0000 mg | ORAL_TABLET | Freq: Every day | ORAL | Status: DC

## 2021-12-03 MED ORDER — ASPIRIN 81 MG PO CHEW: 81.0000 mg | CHEWABLE_TABLET | Freq: Every day | ORAL | Status: DC

## 2021-12-03 MED ORDER — POLYVINYL ALCOHOL 1.4 % OP SOLN: 2.0000 [drp] | OPHTHALMIC | 0 refills | Status: DC | PRN

## 2021-12-03 NOTE — Progress Notes (Signed)
      [address]       [place] [address]             [phone]      13 Days Post-Op Procedure(s) (LRB): G-Tube Placement (N/A)  Subjective:  Patient frustrated about going to dialysis in a chair.  He states he was told he can go in the bed over the weekend.  Objective: Vital signs in last 24 hours: Temp:  [97.6 F (36.4 C)-98.7 F (37.1 C)] 98.7 F (37.1 C) (10/24 0405) Pulse Rate:  [95-114] 95 (10/24 0405) Cardiac Rhythm: Normal sinus rhythm;Bundle branch block (10/23 1921) Resp:  [17-20] 20 (10/24 0405) BP: (99-120)/(69-84) 120/72 (10/24 0405) SpO2:  [97 %-98 %] 98 % (10/24 0405) Weight:  [85.3 kg] 85.3 kg (10/24 0340)  General appearance: alert, cooperative, and no distress Heart: regular rate and rhythm Lungs: clear to auscultation bilaterally Abdomen: soft, non-tender; bowel sounds normal; no masses,  no organomegaly Extremities: edema none present Wound: improving, clean and dry  Lab Results: No results for input(s): "WBC", "HGB", "HCT", "PLT" in the last 72 hours. BMET:  Recent Labs    12/02/21 0019 12/03/21 0045  NA 137 138  K 4.7 5.3*  CL 97* 97*  CO2 27 28  GLUCOSE 114* 116*  BUN 34* 43*  CREATININE 7.00* 8.57*  CALCIUM 8.9 9.0    PT/INR: No results for input(s): "LABPROT", "INR" in the last 72 hours. ABG    Component Value Date/Time   PHART 7.301 (L) 10/22/2021 2354   HCO3 19.3 (L) 10/22/2021 2354   TCO2 20 (L) 10/22/2021 2354   ACIDBASEDEF 7.0 (H) 10/22/2021 2354   O2SAT 71.7 11/02/2021 0439   CBG (last 3)  Recent Labs    12/02/21 2318 12/03/21 0401 12/03/21 0715  GLUCAP 149* 100* 95    Assessment/Plan: S/P Procedure(s) (LRB): G-Tube Placement (N/A)  CV- NSR, BP stable on Midodrine 10 mg TID Pulm- no acute issues, Renal- ESRD, for HD today.. explained to patient that he is unable to have dialysis in bed at an outpatient facility.  He is at the point of discharge and needs to attend dialysis session in a  chair to see how/if he can tolerate GI- oral intake improving Sacral wound improving, continue current wound care Deconditioning- SNF placement Dispo- patient stable for HD today, he is medically stable for transfer to SNF facility today after dialysis   LOS: 66 days    Ellwood Handler, PA-C 12/03/2021

## 2021-12-03 NOTE — Progress Notes (Signed)
   12/03/21 1230  Vitals  Temp 98.4 F (36.9 C)  Pulse Rate 96  Resp (!) 24  BP 123/76  SpO2 98 %  O2 Device Room Air  Oxygen Therapy  Patient Activity (if Appropriate) In chair  Post Treatment  Dialyzer Clearance Clear  Duration of HD Treatment -hour(s) 4 hour(s)  Hemodialysis Intake (mL) 0 mL  Liters Processed 96  Fluid Removed 2000 mL  Tolerated HD Treatment Yes   Received patient in chair to unit.  Alert and oriented.  Informed consent signed and in chart.   Treatment initiated: 0822 Treatment completed: 1230  Patient tolerated well.  Transported back to the room  Alert, without acute distress.  Hand-off given to patient's nurse.      Medication(s) given: Aranesp Post HD weight: unable to obtain   [name]

## 2021-12-03 NOTE — TOC Transition Note (Signed)
Transition of Care [facility]) - CM/SW Discharge Note   Patient Details  Name: JADE BURRIGHT MRN: [id number] Date of Birth: 03/31/1960  Transition of Care [facility]) CM/SW Contact:  Bethann Berkshire, [place] Phone Number: 12/03/2021, 2:08 PM   Clinical Narrative:     Patient will DC to: [facility] Anticipated DC date: 12/03/21 Family notified: ex-wife Tax inspector by: [name]   Per MD patient ready for DC to [place]. RN, patient, patient's family, and facility notified of DC. Discharge Summary and FL2 sent to facility. RN to call report prior to discharge [phone] Room 405). DC packet on chart. Ambulance transport requested for patient.   CSW will sign off for now as social work intervention is no longer needed. Please consult [place] again if new needs arise.   Final next level of care: Skilled Nursing Facility Barriers to Discharge: No Barriers Identified   Patient Goals and CMS Choice        Discharge Placement              Patient chooses bed at: [facility] Patient to be transferred to facility by: [facility] Name of family member notified: ex-wife Colette Patient and family notified of of transfer: 12/03/21  Discharge Plan and Services                                     Social Determinants of Health (SDOH) Interventions     Readmission Risk Interventions    09/20/2021   11:57 AM  Readmission Risk Prevention Plan  Post Dischage Appt Complete  Medication Screening Complete  Transportation Screening Complete

## 2021-12-03 NOTE — Progress Notes (Signed)
Physical Therapy Wound Treatment Patient Details  Name: [name] MRN: [id number] Date of Birth: [birth year]  Today's Date: 12/03/2021 Time: 5883-2549 Time Calculation (min): 42 min  Subjective  Subjective Assessment Subjective: Pt upset that wound therapy present to see him after HD. Patient and Family Stated Goals: Heal wound, decrease pain Date of Onset:  (Unknown) Prior Treatments: Dressing changes, Dakin's solution  Pain Score:  Pt reports increased pain in R shoulder due to positioning. RN premedicated pt for pain before treatment began.   Wound Assessment  Pressure Injury 10/12/21 Sacrum Mid;Lower;Bilateral Unstageable - Full thickness tissue loss in which the base of the injury is covered by slough (yellow, tan, gray, green or brown) and/or eschar (tan, brown or black) in the wound bed. (Active)  Wound Image   12/03/21 1432  Dressing Type Foam - Lift dressing to assess site every shift;Gauze (Comment);Honey;Barrier Film (skin prep) 12/03/21 1432  Dressing Clean, Dry, Intact;Changed 12/03/21 1432  Dressing Change Frequency Daily 12/03/21 1432  State of Healing Early/partial granulation 12/03/21 1432  Site / Wound Assessment Yellow;Red 12/03/21 1432  % Wound base Red or Granulating 60% 12/03/21 1432  % Wound base Yellow/Fibrinous Exudate 40% 12/03/21 1432  % Wound base Black/Eschar 0% 12/03/21 1432  % Wound base Other/Granulation Tissue (Comment) 0% 12/03/21 1432  Peri-wound Assessment Intact 12/03/21 1432  Wound Length (cm) 13 cm 11/26/21 1539  Wound Width (cm) 11 cm 11/26/21 1539  Wound Depth (cm) 2.5 cm 11/26/21 1539  Wound Surface Area (cm^2) 143 cm^2 11/26/21 1539  Wound Volume (cm^3) 357.5 cm^3 11/26/21 1539  Tunneling (cm) 0 11/28/21 1949  Undermining (cm) 0 11/28/21 1949  Margins Unattached edges (unapproximated) 12/03/21 1432  Drainage Amount Moderate 12/03/21 1432  Drainage Description Purulent;Green 12/03/21 1432  Treatment Debridement  (Selective);Irrigation;Packing (Dry gauze) 12/03/21 1432      Selective Debridement (non-excisional) Selective Debridement (non-excisional) - Location: Sacrum Selective Debridement (non-excisional) - Tools Used: Forceps, Scalpel Selective Debridement (non-excisional) - Tissue Removed: Yellow nonviable tissue    Wound Assessment and Plan  Wound Therapy - Assess/Plan/Recommendations Wound Therapy - Clinical Statement: Wound bed improving with increased pre-granulation tissue present this session. This patient will benefit from continued hydrotherapy for selective removal of unviable tissue, to decrease bioburden, and promote wound bed healing. Wound Therapy - Functional Problem List: Global weakness in the setting of extended hospital stay and multiple medical problems. Factors Delaying/Impairing Wound Healing: Diabetes Mellitus, Immobility, Multiple medical problems, Polypharmacy Hydrotherapy Plan: Debridement, Dressing change, Patient/family education, Pulsatile lavage with suction Wound Therapy - Frequency: Other (comment) (2x/week) Wound Therapy - Follow Up Recommendations: dressing changes by RN  Wound Therapy Goals- Improve the function of patient's integumentary system by progressing the wound(s) through the phases of wound healing (inflammation - proliferation - remodeling) by: Wound Therapy Goals - Improve the function of patient's integumentary system by progressing the wound(s) through the phases of wound healing by: Decrease Necrotic Tissue to: 20% Decrease Necrotic Tissue - Progress: Progressing toward goal Increase Granulation Tissue to: 80% Increase Granulation Tissue - Progress: Progressing toward goal Goals/treatment plan/discharge plan were made with and agreed upon by patient/family: Yes Time For Goal Achievement: 7 days Wound Therapy - Potential for Goals: Good  Goals will be updated until maximal potential achieved or discharge criteria met.  Discharge criteria: when  goals achieved, discharge from hospital, MD decision/surgical intervention, no progress towards goals, refusal/missing three consecutive treatments without notification or medical reason.  GP     Charges PT Wound Care Charges $Wound Debridement up  to 20 cm: < or equal to 20 cm $ Wound Debridement each add'l 20 sqcm: 2 $PT Hydrotherapy Visit: 1 Visit       Thelma Comp 12/03/2021, 2:38 PM  Rolinda Roan, PT, DPT Acute Rehabilitation Services Secure Chat Preferred Office: [phone]

## 2021-12-03 NOTE — Progress Notes (Signed)
Patient ID: [name], male   DOB: Dec 03, 1960, 61 y.o.   MRN: [id number] S: Seen on HD - tolerating treatment fine.  No issues.  Says for d/c today.  In recliner - "It's ok:"    O:BP 133/89 (BP Location: Right Arm)   Pulse 93   Temp 98.3 F (36.8 C)   Resp 19   Ht 5\' 7"  (1.702 m)   Wt 85.3 kg   SpO2 96%   BMI 29.45 kg/m  No intake or output data in the 24 hours ending 12/03/21 0953  Intake/Output: I/O last 3 completed shifts: In: 240 [P.O.:240] Out: -   Intake/Output this shift:  No intake/output data recorded. Weight change: 0.1 kg Gen: NAD,lying on recliner during HD CVS: Normal rate Resp: Bilateral chest rise with no increased work of breathing Abd: soft, nondistended Ext: no edema, warm and well-perfused Neuro: awake/alert Dialysis access: [facility] c/d/i  Recent Labs  Lab 11/27/21 0014 11/28/21 0021 11/29/21 0019 11/30/21 0031 12/01/21 0012 12/02/21 0019 12/03/21 0045  NA 136 135 135 137 135 137 138  K 4.1 4.7 4.9 4.6 4.6 4.7 5.3*  CL 95* 96* 96* 97* 96* 97* 97*  CO2 29 27 25 26 27 27 28   GLUCOSE 87 95 69* 140* 111* 114* 116*  BUN 22 34* 22 32* 24* 34* 43*  CREATININE 4.51* 6.08* 4.71* 6.42* 5.51* 7.00* 8.57*  ALBUMIN 2.8* 2.8* 2.9* 2.7* 2.7* 2.8* 2.7*  CALCIUM 8.5* 8.9 8.8* 9.0 8.8* 8.9 9.0  PHOS 3.3 3.1 2.9 3.8 3.2 4.0 4.2    Liver Function Tests: Recent Labs  Lab 12/01/21 0012 12/02/21 0019 12/03/21 0045  ALBUMIN 2.7* 2.8* 2.7*    No results for input(s): "LIPASE", "AMYLASE" in the last 168 hours. No results for input(s): "AMMONIA" in the last 168 hours. CBC: Recent Labs  Lab 11/28/21 0021  WBC 9.5  HGB 10.4*  HCT 33.9*  MCV 90.4  PLT 360    Cardiac Enzymes: No results for input(s): "CKTOTAL", "CKMB", "CKMBINDEX", "TROPONINI" in the last 168 hours. CBG: Recent Labs  Lab 12/02/21 1551 12/02/21 2010 12/02/21 2318 12/03/21 0401 12/03/21 0715  GLUCAP 106* 117* 149* 100* 95     Iron Studies: No results for input(s): "IRON",  "TIBC", "TRANSFERRIN", "FERRITIN" in the last 72 hours. Studies/Results: No results found.  aspirin  81 mg Oral Daily   atorvastatin  80 mg Oral Daily   Chlorhexidine Gluconate Cloth  6 each Topical Daily   darbepoetin (ARANESP) injection - DIALYSIS  100 mcg Intravenous Q Tue-HD   feeding supplement (OSMOLITE 1.5 CAL)  237 mL Per Tube QID   fentaNYL  1 patch Transdermal W09W   folic acid  1 mg Oral Daily   Gerhardt's butt cream   Topical Daily   heparin injection (subcutaneous)  5,000 Units Subcutaneous Q8H   heparin sodium (porcine)       insulin aspart  0-6 Units Subcutaneous Q4H   leptospermum manuka honey  1 Application Topical Daily   loperamide HCl  4 mg Oral TID   midodrine  10 mg Oral 3 times per day on Tue Thu Sat   multivitamin  1 tablet Oral QHS   mouth rinse  15 mL Mouth Rinse 4 times per day   pantoprazole  40 mg Oral BID   QUEtiapine  50 mg Oral QHS   sodium chloride flush  3 mL Intravenous Q12H   thiamine  100 mg Oral Daily    BMET    Component Value  Date/Time   NA 138 12/03/2021 0045   NA 135 02/13/2021 0855   K 5.3 (H) 12/03/2021 0045   CL 97 (L) 12/03/2021 0045   CO2 28 12/03/2021 0045   GLUCOSE 116 (H) 12/03/2021 0045   BUN 43 (H) 12/03/2021 0045   BUN 9 02/13/2021 0855   CREATININE 8.57 (H) 12/03/2021 0045   CALCIUM 9.0 12/03/2021 0045   GFRNONAA 7 (L) 12/03/2021 0045   GFRAA >60 08/25/2019 2110   CBC    Component Value Date/Time   WBC 9.5 11/28/2021 0021   RBC 3.75 (L) 11/28/2021 0021   HGB 10.4 (L) 11/28/2021 0021   HGB 15.4 02/13/2021 0855   HCT 33.9 (L) 11/28/2021 0021   HCT 45.7 02/13/2021 0855   PLT 360 11/28/2021 0021   PLT 239 02/13/2021 0855   MCV 90.4 11/28/2021 0021   MCV 88 02/13/2021 0855   MCH 27.7 11/28/2021 0021   MCHC 30.7 11/28/2021 0021   RDW 16.2 (H) 11/28/2021 0021   RDW 11.3 (L) 02/13/2021 0855   LYMPHSABS 2.2 09/29/2021 1956   MONOABS 2.1 (H) 09/29/2021 1956   EOSABS 0.0 09/29/2021 1956   BASOSABS 0.0 09/29/2021  1956    Assessment/Plan:   ESRD - following ischemic ATN in setting of cardiogenic shock/cardiac arrest.  Started CRRT on 10/07/21 and transitioned to IHD on 10/31/21.  s/p [facility] 11/04/21 with IR. Currently on TTS schedule w/ midodrine support.  Tolerating HD in recliner today. 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  s/p ECMO.  Midodrine being weaned: currently on midodrine 10 mg tid on TTS sched. 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 Unstageable pressure ulcer of left buttock - wound care following.  May need more optimal pain control during dialysis if he is to sit in a chair - it's tolerable at this time.  Dispo - to SNF today, HD [place] on Thurs

## 2021-12-03 NOTE — Progress Notes (Signed)
PT RECEIVED IN UNIT IN CHAIR NO C/OS STABLE FOR HD

## 2021-12-03 NOTE — Plan of Care (Signed)
Problem: Education: Goal: Will demonstrate proper wound care and an understanding of methods to prevent future damage Outcome: Adequate for Discharge Goal: Knowledge of disease or condition will improve Outcome: Adequate for Discharge Goal: Knowledge of the prescribed therapeutic regimen will improve Outcome: Adequate for Discharge Goal: Individualized Educational Video(s) Outcome: Adequate for Discharge   Problem: Activity: Goal: Risk for activity intolerance will decrease Outcome: Adequate for Discharge   Problem: Cardiac: Goal: Will achieve and/or maintain hemodynamic stability Outcome: Adequate for Discharge   Problem: Clinical Measurements: Goal: Postoperative complications will be avoided or minimized Outcome: Adequate for Discharge   Problem: Respiratory: Goal: Respiratory status will improve Outcome: Adequate for Discharge   Problem: Skin Integrity: Goal: Wound healing without signs and symptoms of infection Outcome: Adequate for Discharge Goal: Risk for impaired skin integrity will decrease Outcome: Adequate for Discharge   Problem: Urinary Elimination: Goal: Ability to achieve and maintain adequate renal perfusion and functioning will improve Outcome: Adequate for Discharge   Problem: Education: Goal: Understanding of cardiac disease, CV risk reduction, and recovery process will improve Outcome: Adequate for Discharge Goal: Individualized Educational Video(s) Outcome: Adequate for Discharge   Problem: Activity: Goal: Ability to tolerate increased activity will improve Outcome: Adequate for Discharge   Problem: Cardiac: Goal: Ability to achieve and maintain adequate cardiovascular perfusion will improve Outcome: Adequate for Discharge   Problem: Health Behavior/Discharge Planning: Goal: Ability to safely manage health-related needs after discharge will improve Outcome: Adequate for Discharge   Problem: Education: Goal: Knowledge of General Education  information will improve Description: Including pain rating scale, medication(s)/side effects and non-pharmacologic comfort measures Outcome: Adequate for Discharge   Problem: Health Behavior/Discharge Planning: Goal: Ability to manage health-related needs will improve Outcome: Adequate for Discharge   Problem: Clinical Measurements: Goal: Ability to maintain clinical measurements within normal limits will improve Outcome: Adequate for Discharge Goal: Will remain free from infection Outcome: Adequate for Discharge Goal: Diagnostic test results will improve Outcome: Adequate for Discharge Goal: Respiratory complications will improve Outcome: Adequate for Discharge Goal: Cardiovascular complication will be avoided Outcome: Adequate for Discharge   Problem: Activity: Goal: Risk for activity intolerance will decrease Outcome: Adequate for Discharge   Problem: Nutrition: Goal: Adequate nutrition will be maintained Outcome: Adequate for Discharge   Problem: Coping: Goal: Level of anxiety will decrease Outcome: Adequate for Discharge   Problem: Elimination: Goal: Will not experience complications related to bowel motility Outcome: Adequate for Discharge Goal: Will not experience complications related to urinary retention Outcome: Adequate for Discharge   Problem: Pain Managment: Goal: General experience of comfort will improve Outcome: Adequate for Discharge   Problem: Safety: Goal: Ability to remain free from injury will improve Outcome: Adequate for Discharge   Problem: Skin Integrity: Goal: Risk for impaired skin integrity will decrease Outcome: Adequate for Discharge   Problem: Education: Goal: Ability to describe self-care measures that may prevent or decrease complications (Diabetes Survival Skills Education) will improve Outcome: Adequate for Discharge Goal: Individualized Educational Video(s) Outcome: Adequate for Discharge   Problem: Coping: Goal: Ability to  adjust to condition or change in health will improve Outcome: Adequate for Discharge   Problem: Fluid Volume: Goal: Ability to maintain a balanced intake and output will improve Outcome: Adequate for Discharge   Problem: Health Behavior/Discharge Planning: Goal: Ability to identify and utilize available resources and services will improve Outcome: Adequate for Discharge Goal: Ability to manage health-related needs will improve Outcome: Adequate for Discharge   Problem: Metabolic: Goal: Ability to maintain appropriate glucose levels will  improve Outcome: Adequate for Discharge   Problem: Nutritional: Goal: Maintenance of adequate nutrition will improve Outcome: Adequate for Discharge Goal: Progress toward achieving an optimal weight will improve Outcome: Adequate for Discharge   Problem: Skin Integrity: Goal: Risk for impaired skin integrity will decrease Outcome: Adequate for Discharge   Problem: Tissue Perfusion: Goal: Adequacy of tissue perfusion will improve Outcome: Adequate for Discharge   Problem: Activity: Goal: Ability to tolerate increased activity will improve Outcome: Adequate for Discharge   Problem: Health Behavior/Discharge Planning: Goal: Ability to manage tracheostomy will improve Outcome: Adequate for Discharge

## 2021-12-03 NOTE — Progress Notes (Signed)
Pt is supposed to d/c to snf today per CSW. Contacted pt's wife, [name], via phone and discussed out-pt HD arrangements. Wife aware that CSW is aware of details as well and has provided details to snf as well. Arrangements added to pt's AVS. Contacted [facility] to advise clinic of pt's planned d/c for today and that pt will start on Thursday. Pt will need to arrive at 11:45 to complete paperwork for 12:40 chair time (TTS schedule). Contacted renal PA regarding clinic's need for orders.   Melven Sartorius Renal Navigator [phone]

## 2021-12-03 NOTE — Progress Notes (Signed)
[facility] arrived to transport pt to [place] via ambulance.  Report called to [facility] by Margreta Journey RN.  AVS and transport paperwork given to [facility].  Pt agreeable to transport.  Family at bedside with patient.  Pt discharged.

## 2021-12-05 ENCOUNTER — Encounter (HOSPITAL_COMMUNITY): Payer: Self-pay | Admitting: Emergency Medicine

## 2021-12-05 ENCOUNTER — Other Ambulatory Visit: Payer: Self-pay

## 2021-12-05 ENCOUNTER — Emergency Department (HOSPITAL_COMMUNITY)
Admission: EM | Admit: 2021-12-05 | Discharge: 2021-12-06 | Disposition: A | Discharge status: Home / Self Care | Payer: Self-pay | Attending: Emergency Medicine | Admitting: Emergency Medicine

## 2021-12-05 DIAGNOSIS — Z951 Presence of aortocoronary bypass graft: Secondary | ICD-10-CM | POA: Insufficient documentation

## 2021-12-05 DIAGNOSIS — Z794 Long term (current) use of insulin: Secondary | ICD-10-CM | POA: Insufficient documentation

## 2021-12-05 DIAGNOSIS — E1122 Type 2 diabetes mellitus with diabetic chronic kidney disease: Secondary | ICD-10-CM | POA: Insufficient documentation

## 2021-12-05 DIAGNOSIS — Y841 Kidney dialysis as the cause of abnormal reaction of the patient, or of later complication, without mention of misadventure at the time of the procedure: Secondary | ICD-10-CM | POA: Insufficient documentation

## 2021-12-05 DIAGNOSIS — Z8673 Personal history of transient ischemic attack (TIA), and cerebral infarction without residual deficits: Secondary | ICD-10-CM | POA: Insufficient documentation

## 2021-12-05 DIAGNOSIS — Z87891 Personal history of nicotine dependence: Secondary | ICD-10-CM | POA: Insufficient documentation

## 2021-12-05 DIAGNOSIS — I120 Hypertensive chronic kidney disease with stage 5 chronic kidney disease or end stage renal disease: Secondary | ICD-10-CM | POA: Insufficient documentation

## 2021-12-05 DIAGNOSIS — T8241XA Breakdown (mechanical) of vascular dialysis catheter, initial encounter: Secondary | ICD-10-CM | POA: Insufficient documentation

## 2021-12-05 DIAGNOSIS — Z7982 Long term (current) use of aspirin: Secondary | ICD-10-CM | POA: Insufficient documentation

## 2021-12-05 DIAGNOSIS — N186 End stage renal disease: Secondary | ICD-10-CM | POA: Insufficient documentation

## 2021-12-05 DIAGNOSIS — E785 Hyperlipidemia, unspecified: Secondary | ICD-10-CM | POA: Insufficient documentation

## 2021-12-05 DIAGNOSIS — I2510 Atherosclerotic heart disease of native coronary artery without angina pectoris: Secondary | ICD-10-CM | POA: Insufficient documentation

## 2021-12-05 DIAGNOSIS — Z992 Dependence on renal dialysis: Secondary | ICD-10-CM | POA: Insufficient documentation

## 2021-12-05 NOTE — ED Provider Triage Note (Signed)
Emergency Medicine Provider Triage Evaluation Note  [name] , a 61 y.o. male  was evaluated in triage.  Pt complains of clogged dialysis port.  No missed dialysis.  Sent here from dialysis.  Denies any systemic symptoms..  Review of Systems  Per HPI  Physical Exam  BP 134/74 (BP Location: Right Arm)   Pulse 94   Temp 98.5 F (36.9 C) (Oral)   Resp 18   SpO2 98%  Gen:   Awake, no distress   Resp:  Normal effort  MSK:   Moves extremities without difficulty  Other:    Medical Decision Making  Medically screening exam initiated at 3:34 PM.  Appropriate orders placed.  [name] was informed that the remainder of the evaluation will be completed by another provider, this initial triage assessment does not replace that evaluation, and the importance of remaining in the ED until their evaluation is complete.     Sherrill Raring, PA-C 12/05/21 1534

## 2021-12-05 NOTE — ED Triage Notes (Signed)
Pt BIB [facility] with report of dialysis port in his chest "will not flush." Dr. at [facility] kidney center wanted him sent to ER for evaluation. 138/78, P 78, RR 16, 98%, CBG 123, temp 98 EMS vitals.

## 2021-12-06 ENCOUNTER — Emergency Department (HOSPITAL_COMMUNITY): Payer: Self-pay

## 2021-12-06 ENCOUNTER — Encounter (HOSPITAL_COMMUNITY): Payer: Self-pay

## 2021-12-06 HISTORY — PX: IR VENOCAVAGRAM SVC: IMG679

## 2021-12-06 HISTORY — PX: IR FLUORO GUIDE CV LINE LEFT: IMG2282

## 2021-12-06 LAB — BASIC METABOLIC PANEL
Anion gap: 20 — ABNORMAL HIGH (ref 5–15)
BUN: 39 mg/dL — ABNORMAL HIGH (ref 8–23)
CO2: 24 mmol/L (ref 22–32)
Calcium: 9.4 mg/dL (ref 8.9–10.3)
Chloride: 96 mmol/L — ABNORMAL LOW (ref 98–111)
Creatinine, Ser: 8.71 mg/dL — ABNORMAL HIGH (ref 0.61–1.24)
GFR, Estimated: 6 mL/min — ABNORMAL LOW (ref >60)
Glucose, Bld: 94 mg/dL (ref 70–99)
Potassium: 5.7 mmol/L — ABNORMAL HIGH (ref 3.5–5.1)
Sodium: 140 mmol/L (ref 135–145)

## 2021-12-06 MED ORDER — HEPARIN SODIUM (PORCINE) 1000 UNIT/ML IJ SOLN
INTRAMUSCULAR | Status: AC
  Filled 2021-12-06: qty 10

## 2021-12-06 MED ORDER — LIDOCAINE HCL (PF) 1 % IJ SOLN
INTRAMUSCULAR | Status: AC
  Administered 2021-12-06: 10 mL
  Filled 2021-12-06: qty 10

## 2021-12-06 MED ORDER — CEFAZOLIN SODIUM-DEXTROSE 2-4 GM/100ML-% IV SOLN
2.0000 g | Freq: Once | INTRAVENOUS | Status: AC
  Administered 2021-12-06: 2 g via INTRAVENOUS
  Filled 2021-12-06: qty 100

## 2021-12-06 MED ORDER — IOHEXOL 300 MG/ML  SOLN
100.0000 mL | Freq: Once | INTRAMUSCULAR | Status: AC | PRN
  Administered 2021-12-06: 10 mL via INTRAVENOUS

## 2021-12-06 NOTE — ED Notes (Signed)
RN reviewed discharge instructions with pt. Pt verbalized understanding and had no further questions. VSS upon discharge.  

## 2021-12-06 NOTE — Consult Note (Signed)
Chief Complaint: Clotted Port on Tunneled HD Catheter. Request is for tunneled HD catheter exchange Dema Severin)  Referring Physician(s): [facility]   Supervising Physician: Corrie Mckusick  Patient Status: [facility] - ED  History of Present Illness: [name] is a 61 y.o. male outpatient (in the ED)  Smoker. History of DM, CAD, HLD, HTN, CVA, AKI s/p left sided tunneled hemodialysis catheter placed by IR on 9.26.23. Patient presented to the ED at [facility] on 10.26.23 with one of the ports on the HD catheter clotted. Team is requesting a tunneled HD catheter exchange. Patient is requesting [facility].   Currently without any significant complaints. Patient alert and laying in bed,calm. Denies any fevers, headache, chest pain, SOB, cough, abdominal pain, nausea, vomiting or bleeding. Return precautions and treatment recommendations and follow-up discussed with the patient  who is agreeable with the plan.   Past Medical History:  Diagnosis Date   AKI (acute kidney injury) ([place])    pt unaware of this   Anginal pain ([place])    CAD (coronary artery disease)    a. 03/2015 NSTEMI: LHC with severe 3V CAD  (70% mid RCA, 95% OM1, 90% distal LCx, 90% OM3, 80% prox LAD and 90% ost D1) s/p DES to mLAD w/ small dissction Rx with DES, staged ost Ramus PCI/DES and dLCx s/p PCI/DES    Chest pain 12/24/2020   Diabetes mellitus type 2 in obese [facility])    Diverticulosis    Dyspnea    Dyspnea on exertion 03/16/2015   Dyspnea on exertion   Family history of adverse reaction to anesthesia    patient father- pt states after anesthesia his father "developed dementia"   GERD (gastroesophageal reflux disease)    Hypercholesteremia    Hypertension associated with diabetes ([place]) 03/16/2015   hypertension   NSTEMI (non-ST elevated myocardial infarction) ([place]) 03/17/2015   Obesity    Stroke ([place]) 2022   pt states he had a "mini stroke" during cardiac catheterization   Tobacco abuse     Past Surgical History:   Procedure Laterality Date   APPLICATION OF WOUND VAC N/A 10/03/2021   Procedure: APPLICATION OF WOUND VAC;  Surgeon: Dahlia Byes, MD;  Location: [place];  Service: Thoracic;  Laterality: N/A;   CANNULATION FOR ECMO (EXTRACORPOREAL MEMBRANE OXYGENATION) N/A 09/29/2021   Procedure: CANNULATION FOR ECMO (EXTRACORPOREAL MEMBRANE OXYGENATION);  Surgeon: Lajuana Matte, MD;  Location: [place];  Service: Open Heart Surgery;  Laterality: N/A;   CARDIAC CATHETERIZATION N/A 03/19/2015   Procedure: Left Heart Cath and Coronary Angiography;  Surgeon: Lorretta Harp, MD;  Location: [place] CV LAB;  Service: Cardiovascular;  Laterality: N/A;   CARDIAC CATHETERIZATION N/A 03/20/2015   Procedure: Coronary Stent Intervention;  Surgeon: Lorretta Harp, MD;  Location: [place] CV LAB;  Service: Cardiovascular;  Laterality: N/A;   CARDIAC CATHETERIZATION N/A 03/22/2015   Procedure: Coronary Stent Intervention;  Surgeon: Lorretta Harp, MD;  Location: [place] CV LAB;  Service: Cardiovascular;  Laterality: N/A;   CORONARY ARTERY BYPASS GRAFT N/A 09/12/2021   Procedure: CORONARY ARTERY BYPASS GRAFTING (CABG) X 5 USING LEFT INTERNAL MAMMARY ARTERY AND ENDOSCOPICALLY HARVESTED RIGHT GREATER SAPHENOUS VEIN.;  Surgeon: [name], MD;  Location: [place];  Service: Open Heart Surgery;  Laterality: N/A;   EXPLORATION POST OPERATIVE OPEN HEART N/A 09/30/2021   Procedure: EXPLORATION POST OPERATIVE OPEN HEART WASHOUT;  Surgeon: Lajuana Matte, MD;  Location: [place];  Service: Open Heart Surgery;  Laterality: N/A;  IR FLUORO GUIDE CV LINE LEFT  11/04/2021   IR GASTROSTOMY TUBE MOD SED  11/15/2021   IR GASTROSTOMY TUBE MOD SED  11/20/2021   IR US GUIDE VASC ACCESS LEFT  11/04/2021   LEFT HEART CATH AND CORONARY ANGIOGRAPHY N/A 12/25/2020   Procedure: LEFT HEART CATH AND CORONARY ANGIOGRAPHY;  Surgeon: Troy Sine, MD;  Location: [place] CV LAB;  Service: Cardiovascular;  Laterality: N/A;   LEFT  HEART CATH AND CORONARY ANGIOGRAPHY N/A 12/26/2020   Procedure: LEFT HEART CATH AND CORONARY ANGIOGRAPHY;  Surgeon: Troy Sine, MD;  Location: [place] CV LAB;  Service: Cardiovascular;  Laterality: N/A;   MEDIASTINAL EXPLORATION N/A 10/03/2021   Procedure: MEDIASTINAL WASHOUT;  Surgeon: Dahlia Byes, MD;  Location: [place];  Service: Thoracic;  Laterality: N/A;  PUMP STANDBY   MEDIASTINAL EXPLORATION N/A 10/12/2021   Procedure: MEDIASTINAL WASHOUT;  Surgeon: [name], MD;  Location: [place];  Service: Thoracic;  Laterality: N/A;   PERCUTANEOUS TRACHEOSTOMY N/A 10/17/2021   Procedure: PERCUTANEOUS TRACHEOSTOMY USING SHILEY FLEXIBLE 8 mm CUFFED [place].;  Surgeon: Candee Furbish, MD;  Location: [place];  Service: Pulmonary;  Laterality: N/A;  Percutaneous tracheostomy   PERICARDIAL WINDOW Right 09/28/2021   Procedure: PERICARDIAL WINDOW;  Surgeon: Lajuana Matte, MD;  Location: [place];  Service: Thoracic;  Laterality: Right;  Right VATS.  Lazy lateral.  double lumen ET tube   PLACEMENT OF IMPELLA LEFT VENTRICULAR ASSIST DEVICE N/A 10/07/2021   Procedure: INSERTION OF RIGHT AXILLARY IMPELLA, DECANNULATION OF ECMO, AND MEDIASTINAL WASHOUT;  Surgeon: [name], MD;  Location: [place];  Service: Open Heart Surgery;  Laterality: N/A;  Open right axillary with 8 mm Hemashield Platinum Vascular graft.   RADIOLOGY WITH ANESTHESIA N/A 11/20/2021   Procedure: G-Tube Placement;  Surgeon: Sandi Mariscal, MD;  Location: [place];  Service: Radiology;  Laterality: N/A;   REMOVAL OF IMPELLA LEFT VENTRICULAR ASSIST DEVICE Right 10/17/2021   Procedure: REMOVAL OF IMPELLA LEFT VENTRICULAR ASSIST DEVICE;  Surgeon: [name], MD;  Location: [place];  Service: Open Heart Surgery;  Laterality: Right;   STERNAL CLOSURE N/A 10/12/2021   Procedure: STERNAL CLOSURE;  Surgeon: [name], MD;  [facility];  Service: Thoracic;  Laterality: N/A;   TEE WITHOUT CARDIOVERSION N/A 09/12/2021   Procedure:  TRANSESOPHAGEAL ECHOCARDIOGRAM (TEE);  Surgeon: [name], MD;  Location: [place];  Service: Open Heart Surgery;  Laterality: N/A;   TEE WITHOUT CARDIOVERSION N/A 09/29/2021   Procedure: TRANSESOPHAGEAL ECHOCARDIOGRAM (TEE);  Surgeon: Lajuana Matte, MD;  Location: [place];  Service: Open Heart Surgery;  Laterality: N/A;   TEE WITHOUT CARDIOVERSION N/A 10/03/2021   Procedure: TRANSESOPHAGEAL ECHOCARDIOGRAM (TEE);  Surgeon: Dahlia Byes, MD;  Location: [place];  Service: Thoracic;  Laterality: N/A;   TEE WITHOUT CARDIOVERSION  10/07/2021   Procedure: TRANSESOPHAGEAL ECHOCARDIOGRAM (TEE);  Surgeon: [name], MD;  Location: [facility];  Service: Open Heart Surgery;;   TEE WITHOUT CARDIOVERSION N/A 10/12/2021   Procedure: TRANSESOPHAGEAL ECHOCARDIOGRAM (TEE);  Surgeon: [name], MD;  Location: [facility] OR;  Service: Thoracic;  Laterality: N/A;   TEE WITHOUT CARDIOVERSION N/A 10/17/2021   Procedure: TRANSESOPHAGEAL ECHOCARDIOGRAM (TEE);  Surgeon: [name], MD;  Location: [place];  Service: Open Heart Surgery;  Laterality: N/A;   TESTICLE SURGERY  1970   pt states testicle was ascended and had to be "pulled down"   TONSILLECTOMY     as a child  Allergies: Patient has no active allergies.  Medications: Prior to Admission medications   Medication Sig Start Date End Date Taking? Authorizing Provider  acetaminophen (TYLENOL) 325 MG tablet Take 2 tablets (650 mg total) by mouth every 6 (six) hours as needed for mild pain, headache or fever. 12/03/21   Barrett, Lodema Hong, PA-C  aspirin 81 MG chewable tablet Chew 1 tablet (81 mg total) by mouth daily. 12/03/21   Barrett, Erin R, PA-C  atorvastatin (LIPITOR) 80 MG tablet Take 1 tablet (80 mg total) by mouth daily. 09/06/21   Charlott Rakes, MD  Continuous Blood Gluc Sensor (FREESTYLE LIBRE 2 SENSOR) MISC Utilize as directed q 14 days to monitor blood sugar. 05/10/21   Charlott Rakes, MD  folic acid (FOLVITE) 1 MG tablet Take 1 tablet (1 mg  total) by mouth daily. 12/03/21   Barrett, Erin R, PA-C  glucose blood (TRUE METRIX BLOOD GLUCOSE TEST) test strip Use to check blood sugar three times daily. 06/10/21   Charlott Rakes, MD  insulin aspart (NOVOLOG) 100 UNIT/ML injection Inject 0-6 Units into the skin every 4 (four) hours. CBG 70-150 ( 0 units) 151-200 (1 unit) 201-250 (2 units) 251-300 ( 3 units) 301-350 ( 4 units) 351-400 (5 units) >400 ( 6 units) 12/03/21   Barrett, Erin R, PA-C  Insulin Pen Needle (TRUEPLUS 5-BEVEL PEN NEEDLES) 32G X 4 MM MISC Use to inject Basaglar once daily. 05/10/21   Charlott Rakes, MD  leptospermum manuka honey (MEDIHONEY) PSTE paste Apply 1 Application topically daily. Apply Medihoney to sacrum/bilat buttock wound Q day, then cover with gauze and abd pad and tape  Apply thin layer (3 mm) to wound. 12/03/21   Barrett, Erin R, PA-C  midodrine (PROAMATINE) 10 MG tablet Take 1 tablet (10 mg total) by mouth 3 (three) times daily with meals. 12/03/21   Barrett, Erin R, PA-C  multivitamin (RENA-VIT) TABS tablet Take 1 tablet by mouth at bedtime. 12/03/21   Barrett, Erin R, PA-C  Nutritional Supplements (FEEDING SUPPLEMENT, OSMOLITE 1.5 CAL,) LIQD Place 237 mLs into feeding tube 4 (four) times daily. Bolus a full carton of Osmolite 1.5 (237 ml) formula via GRAVITY per PEG using a 50-60 cc syringe. Flush PEG with 20 ml before and after each bolus.  Pt is willing to take bolus tube feeds. He would prefer not to be asked each time whether he wants the bolus feed but instead would like it to be administered at the scheduled time. 12/03/21   Barrett, Erin R, PA-C  pantoprazole (PROTONIX) 40 MG tablet Take 1 tablet (40 mg total) by mouth 2 (two) times daily. 12/03/21   Barrett, Erin R, PA-C  polyvinyl alcohol (LIQUIFILM TEARS) 1.4 % ophthalmic solution Place 2 drops into both eyes as needed for dry eyes. 12/03/21   Barrett, Erin R, PA-C  QUEtiapine (SEROQUEL) 50 MG tablet Take 1 tablet (50 mg total) by mouth at  bedtime. 12/03/21   Barrett, Erin R, PA-C  thiamine (VITAMIN B-1) 100 MG tablet Take 1 tablet (100 mg total) by mouth daily. 12/03/21   Barrett, Erin R, PA-C  TRUEplus Lancets 28G MISC Use to check blood sugar three times daily. 06/10/21   Charlott Rakes, MD     History reviewed. No pertinent family history.  Social History   Socioeconomic History   Marital status: Legally Separated    Spouse name: Not on file   Number of children: 0   Years of education: Not on file   Highest education level: Not on file  Occupational History   Not on file  Tobacco Use   Smoking status: Former    Packs/day: 0.50    Types: Cigarettes    Quit date: 2017    Years since quitting: 6.8   Smokeless tobacco: Never  Vaping Use   Vaping Use: Some days   Start date: 10/14/2017   Last attempt to quit: 09/09/2021  Substance and Sexual Activity   Alcohol use: Yes    Comment: very occasional, maybe a beer or mixed drink once every few months   Drug use: Not Currently   Sexual activity: Not on file  Other Topics Concern   Not on file  Social History Narrative   Not on file   Social Determinants of Health   Financial Resource Strain: Low Risk  (12/06/2021)   Overall Financial Resource Strain (CARDIA)    Difficulty of Paying Living Expenses: Not hard at all  Food Insecurity: No Food Insecurity (12/06/2021)   Hunger Vital Sign    Worried About Running Out of Food in the Last Year: Never true    Ran Out of Food in the Last Year: Never true  Transportation Needs: No Transportation Needs (12/06/2021)   PRAPARE - Hydrologist (Medical): No    Lack of Transportation (Non-Medical): No  Physical Activity: Not on file  Stress: Not on file  Social Connections: Not on file    Review of Systems: A 12 point ROS discussed and pertinent positives are indicated in the HPI above.  All other systems are negative.  Review of Systems  Constitutional:  Negative for fever.  HENT:   Negative for congestion.   Respiratory:  Negative for cough and shortness of breath.   Cardiovascular:  Negative for chest pain.  Gastrointestinal:  Negative for abdominal pain.  Neurological:  Negative for headaches.  Psychiatric/Behavioral:  Negative for behavioral problems and confusion.     Vital Signs: BP (!) 138/93   Pulse 90   Temp 97.6 F (36.4 C) (Oral)   Resp 16   Ht 5\' 7"  (1.702 m)   Wt 188 lb (85.3 kg)   SpO2 100%   BMI 29.44 kg/m     Physical Exam Vitals and nursing note reviewed.  Constitutional:      Appearance: He is well-developed.  HENT:     Head: Normocephalic.  Pulmonary:     Effort: Pulmonary effort is normal.  Musculoskeletal:     Cervical back: Normal range of motion.  Skin:    General: Skin is dry.  Neurological:     Mental Status: He is alert and oriented to person, place, and time.     Imaging: DG Chest 1 View  Result Date: 12/06/2021 CLINICAL DATA:  Provided history: Encounter for dialysis catheter care. EXAM: CHEST  1 VIEW COMPARISON:  Radiographs 11/12/2021 and earlier. FINDINGS: Unchanged position of a left-sided dialysis catheter with tip projecting at the level of the superior cavoatrial junction. Prior median sternotomy. Cardiomegaly. Hazy opacity within the left lung base. Minimal linear atelectasis within the right lung base. No sizable pleural effusion or evidence of pneumothorax. No acute bony abnormality identified. Surgical clips project in the region of the right axilla. IMPRESSION: 1. Unchanged position of a left-sided dialysis catheter with tip at the level of the superior cavoatrial junction. 2. Hazy opacity within the left lung base. While this may reflect atelectasis, pneumonia cannot be excluded. 3. Minimal linear atelectasis within the right lung base. 4. Cardiomegaly. Electronically Signed   By: Marylyn Ishihara  Armandina Gemma D.O.   On: 12/06/2021 10:25   DG Swallowing Func-Speech Pathology  Result Date: 11/22/2021 Table formatting from  the original result was not included. Objective Swallowing Evaluation: Type of Study: MBS-Modified Barium Swallow Study  Patient Details Name: [name] MRN: [id number] Date of Birth: 1960-10-07 Today's Date: 11/22/2021 Time: SLP Start Time (ACUTE ONLY): 1033 -SLP Stop Time (ACUTE ONLY): 1601 SLP Time Calculation (min) (ACUTE ONLY): 15 min Past Medical History: Past Medical History: Diagnosis Date  AKI (acute kidney injury) ([place])   pt unaware of this  Anginal pain ([place])   CAD (coronary artery disease)   a. 03/2015 NSTEMI: LHC with severe 3V CAD  (70% mid RCA, 95% OM1, 90% distal LCx, 90% OM3, 80% prox LAD and 90% ost D1) s/p DES to mLAD w/ small dissction Rx with DES, staged ost Ramus PCI/DES and dLCx s/p PCI/DES   Chest pain 12/24/2020  Diabetes mellitus type 2 in obese [facility])   Diverticulosis   Dyspnea   Dyspnea on exertion 03/16/2015  Dyspnea on exertion  Family history of adverse reaction to anesthesia   patient father- pt states after anesthesia his father "developed dementia"  GERD (gastroesophageal reflux disease)   Hypercholesteremia   Hypertension associated with diabetes ([place]) 03/16/2015  hypertension  NSTEMI (non-ST elevated myocardial infarction) ([place]) 03/17/2015  Obesity   Stroke ([place]) 2022  pt states he had a "mini stroke" during cardiac catheterization  Tobacco abuse  Past Surgical History: Past Surgical History: Procedure Laterality Date  APPLICATION OF WOUND VAC N/A 0/93/2355  Procedure: APPLICATION OF WOUND VAC;  Surgeon: Dahlia Byes, MD;  Location: [place];  Service: Thoracic;  Laterality: N/A;  CANNULATION FOR ECMO (EXTRACORPOREAL MEMBRANE OXYGENATION) N/A 09/29/2021  Procedure: CANNULATION FOR ECMO (EXTRACORPOREAL MEMBRANE OXYGENATION);  Surgeon: Lajuana Matte, MD;  Location: [place];  Service: Open Heart Surgery;  Laterality: N/A;  CARDIAC CATHETERIZATION N/A 03/19/2015  Procedure: Left Heart Cath and Coronary Angiography;  Surgeon: Lorretta Harp, MD;  Location: [place] CV LAB;   Service: Cardiovascular;  Laterality: N/A;  CARDIAC CATHETERIZATION N/A 03/20/2015  Procedure: Coronary Stent Intervention;  Surgeon: Lorretta Harp, MD;  Location: [place] CV LAB;  Service: Cardiovascular;  Laterality: N/A;  CARDIAC CATHETERIZATION N/A 03/22/2015  Procedure: Coronary Stent Intervention;  Surgeon: Lorretta Harp, MD;  Location: [place] CV LAB;  Service: Cardiovascular;  Laterality: N/A;  CORONARY ARTERY BYPASS GRAFT N/A 09/12/2021  Procedure: CORONARY ARTERY BYPASS GRAFTING (CABG) X 5 USING LEFT INTERNAL MAMMARY ARTERY AND ENDOSCOPICALLY HARVESTED RIGHT GREATER SAPHENOUS VEIN.;  Surgeon: [name], MD;  Location: [place];  Service: Open Heart Surgery;  Laterality: N/A;  EXPLORATION POST OPERATIVE OPEN HEART N/A 09/30/2021  Procedure: EXPLORATION POST OPERATIVE OPEN HEART WASHOUT;  Surgeon: Lajuana Matte, MD;  Location: [place];  Service: Open Heart Surgery;  Laterality: N/A;  IR FLUORO GUIDE CV LINE LEFT  11/04/2021  IR GASTROSTOMY TUBE MOD SED  11/15/2021  IR GASTROSTOMY TUBE MOD SED  11/20/2021  IR US GUIDE VASC ACCESS LEFT  11/04/2021  LEFT HEART CATH AND CORONARY ANGIOGRAPHY N/A 12/25/2020  Procedure: LEFT HEART CATH AND CORONARY ANGIOGRAPHY;  Surgeon: Troy Sine, MD;  Location: [place] CV LAB;  Service: Cardiovascular;  Laterality: N/A;  LEFT HEART CATH AND CORONARY ANGIOGRAPHY N/A 12/26/2020  Procedure: LEFT HEART CATH AND CORONARY ANGIOGRAPHY;  Surgeon: Troy Sine, MD;  Location: [place] CV LAB;  Service: Cardiovascular;  Laterality: N/A;  MEDIASTINAL EXPLORATION N/A 10/03/2021  Procedure: MEDIASTINAL WASHOUT;  Surgeon: Dahlia Byes, MD;  Location: [place];  Service: Thoracic;  Laterality: N/A;  PUMP STANDBY  MEDIASTINAL EXPLORATION N/A 10/12/2021  Procedure: MEDIASTINAL WASHOUT;  Surgeon: [name], MD;  Location: [place];  Service: Thoracic;  Laterality: N/A;  PERCUTANEOUS TRACHEOSTOMY N/A 10/17/2021  Procedure: PERCUTANEOUS TRACHEOSTOMY USING SHILEY  FLEXIBLE 8 mm CUFFED [place].;  Surgeon: Candee Furbish, MD;  Location: [place];  Service: Pulmonary;  Laterality: N/A;  Percutaneous tracheostomy  PERICARDIAL WINDOW Right 09/28/2021  Procedure: PERICARDIAL WINDOW;  Surgeon: Lajuana Matte, MD;  Location: [place];  Service: Thoracic;  Laterality: Right;  Right VATS.  Lazy lateral.  double lumen ET tube  PLACEMENT OF IMPELLA LEFT VENTRICULAR ASSIST DEVICE N/A 10/07/2021  Procedure: INSERTION OF RIGHT AXILLARY IMPELLA, DECANNULATION OF ECMO, AND MEDIASTINAL WASHOUT;  Surgeon: [name], MD;  Location: [place];  Service: Open Heart Surgery;  Laterality: N/A;  Open right axillary with 8 mm Hemashield Platinum Vascular graft.  RADIOLOGY WITH ANESTHESIA N/A 11/20/2021  Procedure: G-Tube Placement;  Surgeon: Sandi Mariscal, MD;  Location: [place];  Service: Radiology;  Laterality: N/A;  REMOVAL OF IMPELLA LEFT VENTRICULAR ASSIST DEVICE Right 10/17/2021  Procedure: REMOVAL OF IMPELLA LEFT VENTRICULAR ASSIST DEVICE;  Surgeon: [name], MD;  Location: [place];  Service: Open Heart Surgery;  Laterality: Right;  STERNAL CLOSURE N/A 10/12/2021  Procedure: STERNAL CLOSURE;  Surgeon: [name], MD;  [facility];  Service: Thoracic;  Laterality: N/A;  TEE WITHOUT CARDIOVERSION N/A 09/12/2021  Procedure: TRANSESOPHAGEAL ECHOCARDIOGRAM (TEE);  Surgeon: [name], MD;  Location: [place];  Service: Open Heart Surgery;  Laterality: N/A;  TEE WITHOUT CARDIOVERSION N/A 09/29/2021  Procedure: TRANSESOPHAGEAL ECHOCARDIOGRAM (TEE);  Surgeon: Lajuana Matte, MD;  Location: [place];  Service: Open Heart Surgery;  Laterality: N/A;  TEE WITHOUT CARDIOVERSION N/A 10/03/2021  Procedure: TRANSESOPHAGEAL ECHOCARDIOGRAM (TEE);  Surgeon: Dahlia Byes, MD;  Location: [place];  Service: Thoracic;  Laterality: N/A;  TEE WITHOUT CARDIOVERSION  10/07/2021  Procedure: TRANSESOPHAGEAL ECHOCARDIOGRAM (TEE);  Surgeon: [name], MD;  [facility] OR;  Service: Open Heart Surgery;;  TEE  WITHOUT CARDIOVERSION N/A 10/12/2021  Procedure: TRANSESOPHAGEAL ECHOCARDIOGRAM (TEE);  Surgeon: [name], MD;  Location: [facility] OR;  Service: Thoracic;  Laterality: N/A;  TEE WITHOUT CARDIOVERSION N/A 10/17/2021  Procedure: TRANSESOPHAGEAL ECHOCARDIOGRAM (TEE);  Surgeon: [name], MD;  Location: [place];  Service: Open Heart Surgery;  Laterality: N/A;  TESTICLE SURGERY  1970  pt states testicle was ascended and had to be "pulled down"  TONSILLECTOMY    as a child [name], bradycardia and hypotension. Pt with bil pleural effusion and pericardial effusion s/p VATS with pericardial window 8/19. 8/20 cardiac tamponade with arrest,  intubated with bedside mediastinal exploration and ECMO via fem access. OR 8/21 for reexploration due to mediastinal hemorrhage. 8/28 OR for washout with ECMO decannulation and Impella placed. 8/29 brief PEA arrest with DCCV to NSR. 9/2 chest closure. 9/7 trach and Impella removed.9/11 VAC removed. Decannulated 9/26. PMHx: CAD s/p recent CABG x5 (admitted  8/3-11/23 with post op Afib), CKD stage II, DM, HTN, AKI, Afib and CVA  Subjective: alert but very uncomfortable in [place] chair, agreeable but only if study is kept brief  Recommendations for follow up therapy are one component of a multi-disciplinary discharge planning process, led by the attending physician.  Recommendations may be updated based on patient status, additional functional criteria and insurance authorization. Assessment /  Plan / Recommendation   11/22/2021  11:00 AM Clinical Impressions Clinical Impression Pt shows great improvements on MBS today compared to oropharyngeal function on previous FEES. He has an oral dysphagia that includes piecemeal swallowing and bilateral buccal pocketing with thin liquids, purees, and solids. He responded well to verbal cues to swallow again, clearing more of his oral residue but not completely clearing his oral cavity. Occasional premature spillage was  observed with thin liquids, and he needs somewhat prolonged mastication time with a graham cracker. Pharyngeally there is still some weakness with reduced hyoid excursion and base of tongue retraction, but his pharyngeal residue is significantly reduced. There is a trace coating along his base of tongue and into his valleculae, but no significant build up of pharyngeal residue regardless of texture. Recommend that pt initiate Dys 2 diet and thin liquids. As pt can demonstrate improving oral cavity clearance, he will likely be able to advance his diet clinically at this point. Would try to optimize positioning during PO intake, which may be a little more challenging for him given wounds, but he has demonstrated his ability to sit upright in the chair and the bed. SLP Visit Diagnosis Dysphagia, oropharyngeal phase (R13.12) Impact on safety and function Mild aspiration risk;Risk for inadequate nutrition/hydration     11/22/2021  11:00 AM Treatment Recommendations Treatment Recommendations Therapy as outlined in treatment plan below     11/22/2021  11:00 AM Prognosis Prognosis for Safe Diet Advancement Good Barriers to Reach Goals Cognitive deficits   11/22/2021  11:00 AM Diet Recommendations SLP Diet Recommendations Dysphagia 2 (Fine chop) solids;Thin liquid Liquid Administration via Cup;Straw Medication Administration Whole meds with liquid Compensations Minimize environmental distractions;Slow rate;Small sips/bites;Lingual sweep for clearance of pocketing Postural Changes Seated upright at 90 degrees     11/22/2021  11:00 AM Other Recommendations Oral Care Recommendations Oral care BID Other Recommendations Have oral suction available Follow Up Recommendations Skilled nursing-short term rehab (<3 hours/day) Assistance recommended at discharge Frequent or constant Supervision/Assistance Functional Status Assessment Patient has had a recent decline in their functional status and demonstrates the ability to make  significant improvements in function in a reasonable and predictable amount of time.   11/22/2021  11:00 AM Frequency and Duration  Speech Therapy Frequency (ACUTE ONLY) min 2x/week Treatment Duration 2 weeks     11/22/2021  11:00 AM Oral Phase Oral Phase Impaired Oral - Thin Cup Premature spillage;Piecemeal swallowing;Right pocketing in lateral sulci;Left pocketing in lateral sulci Oral - Thin Straw Premature spillage;Piecemeal swallowing;Right pocketing in lateral sulci;Left pocketing in lateral sulci Oral - Puree Piecemeal swallowing;Right pocketing in lateral sulci;Left pocketing in lateral sulci Oral - Regular Piecemeal swallowing;Right pocketing in lateral sulci;Left pocketing in lateral sulci;Impaired mastication Oral - Pill [facility]    11/22/2021  11:00 AM Pharyngeal Phase Pharyngeal Phase Impaired Pharyngeal- Nectar Teaspoon NT Pharyngeal- Thin Teaspoon NT Pharyngeal- Thin Cup Reduced anterior laryngeal mobility;Reduced tongue base retraction;Pharyngeal residue - valleculae Pharyngeal- Thin Straw Reduced anterior laryngeal mobility;Reduced tongue base retraction;Pharyngeal residue - valleculae Pharyngeal- Puree Reduced anterior laryngeal mobility;Reduced tongue base retraction;Pharyngeal residue - valleculae Pharyngeal- Regular Reduced anterior laryngeal mobility;Reduced tongue base retraction;Pharyngeal residue - valleculae Pharyngeal- Pill Reduced anterior laryngeal mobility;Reduced tongue base retraction    11/22/2021  11:00 AM Cervical Esophageal Phase  Cervical Esophageal Phase [facility] Osie Bond., M.A. [place] Office [phone] Secure chat preferred 11/22/2021, 11:48 AM                     IR GASTROSTOMY  TUBE MOD SED  Result Date: 11/20/2021 INDICATION: Recent CABG, now with dysphagia. Please perform percutaneous gastrostomy tube placement for enteric nutrition supplementation purposes. Patient underwent attempted percutaneous gastrostomy tube placement on 11/15/2021 however  was unable to tolerate the procedure and as such returns today for the procedure was performed with general anesthesia. EXAM: PUSH GASTROSTOMY TUBE PLACEMENT COMPARISON:  Attempted image guided percutaneous gastrostomy tube placement-11/15/2021; chest CT-09/28/2021 MEDICATIONS: Ancef 2 gm IV; Antibiotics were administered within 1 hour of the procedure. CONTRAST:  20 cc Omnipaque 300 administered into the gastric lumen. ANESTHESIA/SEDATION: General anesthesia FLUOROSCOPY TIME:  1 minute, 18 seconds (35 mGy) COMPLICATIONS: None immediate. PROCEDURE: Informed written consent was obtained from the family following explanation of the procedure, risks, benefits and alternatives. A time out was performed prior to the initiation of the procedure. Ultrasound scanning was performed to demarcate the edge of the left lobe of the liver. Attention was made to avoid the healing incisions from previous mediastinal drains as a sequela of recent CABG. Maximal barrier sterile technique utilized including caps, mask, sterile gowns, sterile gloves, large sterile drape, hand hygiene and Betadine prep. The left upper quadrant was sterilely prepped and draped. A oral gastric catheter was inserted into the stomach under fluoroscopy. The existing nasogastric feeding tube was removed. The left costal margin and barium opacified transverse colon were identified and avoided. Air was injected into the stomach for insufflation and visualization under fluoroscopy. Under sterile conditions and local anesthesia, 3 T tacks were utilized to pexy the anterior aspect of the stomach against the ventral abdominal wall. Contrast injection confirmed appropriate positioning of each of the T tacks. An incision was made between the T tacks and a 17 gauge trocar needle was utilized to access the stomach. Needle position was confirmed within the stomach with aspiration of air and injection of a small amount of contrast. A stiff Glidewire was advanced into the  gastric lumen and under intermittent fluoroscopic guidance, the access needle was exchanged for a Kumpe catheter. With the use of the Kumpe catheter, a stiff Glidewire was advanced into the horizontal segment of the duodenum. Under intermittent fluoroscopic guidance, the Kumpe catheter was exchanged for a telescoping peel-away sheath, ultimately allowing placement of a 18-French balloon retention gastrostomy tube. The retention balloon was insufflated with a mixture of dilute saline and contrast and pulled taut against the anterior wall of the stomach. The external disc was cinched. Contrast injection confirms positioning within the stomach. Several spot radiographic images were obtained in various obliquities for documentation. The patient tolerated procedure well without immediate post procedural complication. FINDINGS: After successful fluoroscopic guided placement, the gastrostomy tube is appropriately positioned with internal retention balloon against the ventral aspect of the gastric lumen. IMPRESSION: Successful fluoroscopic insertion of an 89 French balloon retention gastrostomy tube. The gastrostomy may be used immediately for medication administration and in 24 hrs for the initiation of feeds. Electronically Signed   By: Sandi Mariscal M.D.   On: 11/20/2021 14:17   IR GASTROSTOMY TUBE MOD SED  Result Date: 11/15/2021 INDICATION: 61 year old male presents for image guided percutaneous gastrostomy EXAM: IMAGE GUIDED PERCUTANEOUS GASTROSTOMY DISCONTINUED GASTROSTOMY MEDICATIONS: None ANESTHESIA/SEDATION: Versed 1.0 mg IV; Fentanyl 50 mcg IV Moderate Sedation Time:  10 minutes The patient was continuously monitored during the procedure by the interventional radiology nurse under my direct supervision. CONTRAST:  0 FLUOROSCOPY: Radiation Exposure Index (as provided by the fluoroscopic device): 30 mGy Kerma COMPLICATIONS: None PROCEDURE: Informed written consent was obtained from the patient and the  patient's  family after a thorough discussion of the procedural risks, benefits and alternatives. All questions were addressed. Maximal Sterile Barrier Technique was utilized including caps, mask, sterile gowns, sterile gloves, sterile drape, hand hygiene and skin antiseptic. A timeout was performed prior to the initiation of the procedure. The patient was brought to the interventional suite. A time-out was performed, and moderate sedation was initiated, as the patient was uncomfortable with the initial orogastric tube placement. After the initial or gastric tube placement attempt, the patient withdrew his consent and did not want to proceed. After some discussion with the patient regarding using either or gastric tube placement or the existing nasogastric tube placement, it was clear he would not proceed and we discontinue the procedure. IMPRESSION: Discontinued attempt at image guided gastrostomy. Signed, Dulcy Fanny. Nadene Rubins, RPVI [facility] Radiology PLAN: We will arrange anaesthesia support for image guided gastrostomy. Electronically Signed   By: Corrie Mckusick D.O.   On: 11/15/2021 16:15   DG Abd 1 View  Result Date: 11/14/2021 CLINICAL DATA:  Check gastric catheter placement EXAM: ABDOMEN - 1 VIEW COMPARISON:  10/31/2021 FINDINGS: Feeding catheter is noted extending into the second portion of the duodenum. No obstructive changes are seen. IMPRESSION: Feeding catheter in the second portion of the duodenum. Electronically Signed   By: Inez Catalina M.D.   On: 11/14/2021 03:11   DG Chest Port 1 View  Result Date: 11/12/2021 CLINICAL DATA:  Shortness of breath.  Recent CABG. EXAM: PORTABLE CHEST 1 VIEW COMPARISON:  Chest radiograph November 06, 2021, CT chest 09/28/2021 FINDINGS: Postsurgical changes from median sternotomy and CABG. There is an enteric tube that courses below diaphragm with the tip out of the field of view. Left-sided tunneled central venous  catheter in place with the tip in the mid SVC. Surgical clips are also seen in the right axilla. Cardiac and mediastinal contours are unchanged compared to recent chest radiograph. Redemonstrated are bibasilar pulmonary opacities and likely a small to moderate-sized left-sided pleural effusion. The hazy pulmonary opacity at the right lung base appears slightly more conspicuous on this exam. There are no displaced rib fractures. There are degenerative changes at the right-greater-than-left [facility] joint. Visualized upper abdomen is unremarkable. IMPRESSION: 1. Persistent bibasilar pulmonary opacities and likely small to moderate-sized left-sided pleural effusion. The hazy pulmonary opacity at the right lung base appears slightly more conspicuous on this exam, which could represent progressive atelectasis and a component of pulmonary edema. 2. Support apparatus as above. Electronically Signed   By: Marin Roberts M.D.   On: 11/12/2021 08:17    Labs:  CBC: Recent Labs    11/24/21 0027 11/25/21 0643 11/26/21 0013 11/28/21 0021  WBC 9.0 9.3 8.4 9.5  HGB 10.3* 10.3* 10.5* 10.4*  HCT 34.9* 33.1* 34.0* 33.9*  PLT 391 376 398 360    COAGS: Recent Labs    10/06/21 1600 10/07/21 0408 10/07/21 1530 10/08/21 0350 10/15/21 1519 10/16/21 0407 10/21/21 0403 11/14/21 1659 11/19/21 1524 11/20/21 0009  INR  --  1.4* 1.6*   < > 1.3*   < > 1.3* 1.1 1.1 1.1  APTT 44* 46* 124*  --  35  --   --   --   --   --    < > = values in this interval not displayed.    BMP: Recent Labs    12/01/21 0012 12/02/21 0019 12/03/21 0045 12/06/21 1046  NA 135 137 138 140  K 4.6 4.7 5.3*  5.7*  CL 96* 97* 97* 96*  CO2 27 27 28 24   GLUCOSE 111* 114* 116* 94  BUN 24* 34* 43* 39*  CALCIUM 8.8* 8.9 9.0 9.4  CREATININE 5.51* 7.00* 8.57* 8.71*  GFRNONAA 11* 8* 7* 6*    LIVER FUNCTION TESTS: Recent Labs    10/05/21 0441 10/06/21 1600 10/07/21 0408 10/08/21 0350 10/22/21 0406 10/22/21 1529 11/30/21 0031  12/01/21 0012 12/02/21 0019 12/03/21 0045  BILITOT 1.0 2.2* 1.1  --  1.0  --   --   --   --   --   AST 28 99* 97*  --  38  --   --   --   --   --   ALT 11 10 15   --  22  --   --   --   --   --   ALKPHOS 37* 73 89  --  137*  --   --   --   --   --   PROT 3.7* 3.9* 4.1*  --  6.2*  --   --   --   --   --   ALBUMIN 2.0* 1.9* 1.9*   < > 2.4*  2.4*   < > 2.7* 2.7* 2.8* 2.7*   < > = values in this interval not displayed.    Assessment and Plan:  61 y.o. male outpatient (in the ED)  Smoker. History of DM, CAD, HLD, HTN, CVA, AKI s/p left sided tunneled hemodialysis catheter placed by IR on 9.26.23. Patient presented to the ED at [facility] on 10.26.23 with pone of the ports on the HD catheter clotted. Team is requesting a tunneled HD catheter exchange.  Potassium 5.7. BUN 39, Cr 8.71 GFR< 6. Patient is on ASA. All other labs and medications are within acceptable.  Risks and benefits discussed with the patient including, but not limited to bleeding, infection, vascular injury, pneumothorax which may require chest tube placement, air embolism or even death  All of the patient's questions were answered, patient is agreeable to proceed. Consent signed and in chart.   Thank you for this interesting consult.  I greatly enjoyed meeting [name] and look forward to participating in their care.  A copy of this report was sent to the requesting provider on this date.  Electronically Signed: Jacqualine Mau, NP 12/06/2021, 12:17 PM   I spent a total of 20 Minutes    in face to face in clinical consultation, greater than 50% of which was counseling/coordinating care for tunneled HD catheter exchange

## 2021-12-06 NOTE — TOC CM/SW Note (Signed)
Pt is from [place] [facility] under [facility] letter of guarantee.    HF TOC CM received messge from wife, [place]. States she has paperwork needed for DSS CW to process his Medicaid application. Spoke to pt and reviewed. Pt was able to sign. Faxed signed copy back to pt's wife. [place], Heart Failure TOC CM [phone]

## 2021-12-06 NOTE — Progress Notes (Signed)
CSW spoke with [place] at [place] who confirms patient can return once medically ready for discharge.  The number to call for report is [phone].  RN will call report and PTAR once ready.  Madilyn Fireman, MSW, LCSW Transitions of Care  Clinical Social Worker II [phone]

## 2021-12-06 NOTE — ED Notes (Signed)
Per pt, pts cousin to provide transport back to [place]. PTAR to be canceled

## 2021-12-06 NOTE — Progress Notes (Addendum)
Contacted by nephrologist to inquire if pt can receive out-pt HD at [facility] on normal schedule tomorrow. Contacted [place] and spoke to [place], Agricultural consultant. Pt can receive treatment at clinic tomorrow at normal time.This info was provided to nephrologist.   Melven Sartorius Renal Navigator [phone]

## 2021-12-06 NOTE — ED Provider Notes (Signed)
[facility] EMERGENCY DEPARTMENT Provider Note   CSN: [id number] Arrival date & time: 12/05/21  1429     History  Chief Complaint  Patient presents with   [facility]    [name] is a 61 y.o. male.  HPI    61 year old male comes in with chief complaint of clogged dialysis catheter.  Patient has history of hypertension, hyperlipidemia, diabetes, CABG, ESRD on hemodialysis with tunneled catheter of the chest utilized for hemodialysis.  Patient goes to hemodialysis Tuesday, Thursday and Saturday.  He went to dialysis yesterday and was told that his dialysis catheter is clogged, he was sent to the emergency room.  Patient denies any chest pain, shortness of breath.  Home Medications Prior to Admission medications   Medication Sig Start Date End Date Taking? Authorizing Provider  acetaminophen (TYLENOL) 325 MG tablet Take 2 tablets (650 mg total) by mouth every 6 (six) hours as needed for mild pain, headache or fever. 12/03/21   Barrett, Lodema Hong, PA-C  aspirin 81 MG chewable tablet Chew 1 tablet (81 mg total) by mouth daily. 12/03/21   Barrett, Erin R, PA-C  atorvastatin (LIPITOR) 80 MG tablet Take 1 tablet (80 mg total) by mouth daily. 09/06/21   Charlott Rakes, MD  Continuous Blood Gluc Sensor (FREESTYLE LIBRE 2 SENSOR) MISC Utilize as directed q 14 days to monitor blood sugar. 05/10/21   Charlott Rakes, MD  folic acid (FOLVITE) 1 MG tablet Take 1 tablet (1 mg total) by mouth daily. 12/03/21   Barrett, Erin R, PA-C  glucose blood (TRUE METRIX BLOOD GLUCOSE TEST) test strip Use to check blood sugar three times daily. 06/10/21   Charlott Rakes, MD  insulin aspart (NOVOLOG) 100 UNIT/ML injection Inject 0-6 Units into the skin every 4 (four) hours. CBG 70-150 ( 0 units) 151-200 (1 unit) 201-250 (2 units) 251-300 ( 3 units) 301-350 ( 4 units) 351-400 (5 units) >400 ( 6 units) 12/03/21   Barrett, Erin R, PA-C  Insulin Pen Needle (TRUEPLUS 5-BEVEL PEN  NEEDLES) 32G X 4 MM MISC Use to inject Basaglar once daily. 05/10/21   Charlott Rakes, MD  leptospermum manuka honey (MEDIHONEY) PSTE paste Apply 1 Application topically daily. Apply Medihoney to sacrum/bilat buttock wound Q day, then cover with gauze and abd pad and tape  Apply thin layer (3 mm) to wound. 12/03/21   Barrett, Erin R, PA-C  midodrine (PROAMATINE) 10 MG tablet Take 1 tablet (10 mg total) by mouth 3 (three) times daily with meals. 12/03/21   Barrett, Erin R, PA-C  multivitamin (RENA-VIT) TABS tablet Take 1 tablet by mouth at bedtime. 12/03/21   Barrett, Erin R, PA-C  Nutritional Supplements (FEEDING SUPPLEMENT, OSMOLITE 1.5 CAL,) LIQD Place 237 mLs into feeding tube 4 (four) times daily. Bolus a full carton of Osmolite 1.5 (237 ml) formula via GRAVITY per PEG using a 50-60 cc syringe. Flush PEG with 20 ml before and after each bolus.  Pt is willing to take bolus tube feeds. He would prefer not to be asked each time whether he wants the bolus feed but instead would like it to be administered at the scheduled time. 12/03/21   Barrett, Erin R, PA-C  pantoprazole (PROTONIX) 40 MG tablet Take 1 tablet (40 mg total) by mouth 2 (two) times daily. 12/03/21   Barrett, Erin R, PA-C  polyvinyl alcohol (LIQUIFILM TEARS) 1.4 % ophthalmic solution Place 2 drops into both eyes as needed for dry eyes. 12/03/21   Barrett, Lodema Hong, PA-C  QUEtiapine (SEROQUEL) 50 MG tablet Take 1 tablet (50 mg total) by mouth at bedtime. 12/03/21   Barrett, Erin R, PA-C  thiamine (VITAMIN B-1) 100 MG tablet Take 1 tablet (100 mg total) by mouth daily. 12/03/21   Barrett, Erin R, PA-C  TRUEplus Lancets 28G MISC Use to check blood sugar three times daily. 06/10/21   Charlott Rakes, MD      Allergies    Patient has no active allergies.    Review of Systems   Review of Systems  All other systems reviewed and are negative.   Physical Exam Updated Vital Signs BP (!) 138/93   Pulse 90   Temp 97.6 F (36.4 C) (Oral)    Resp 16   Ht 5\' 7"  (1.702 m)   Wt 85.3 kg   SpO2 100%   BMI 29.44 kg/m  Physical Exam Vitals and nursing note reviewed.  Constitutional:      Appearance: He is well-developed.  HENT:     Head: Atraumatic.  Cardiovascular:     Rate and Rhythm: Normal rate.  Pulmonary:     Effort: Pulmonary effort is normal.  Musculoskeletal:     Cervical back: Neck supple.  Skin:    General: Skin is warm.  Neurological:     Mental Status: He is alert and oriented to person, place, and time.     ED Results / Procedures / Treatments   Labs (all labs ordered are listed, but only abnormal results are displayed) Labs Reviewed  BASIC METABOLIC PANEL - Abnormal; Notable for the following components:      Result Value   Potassium 5.7 (*)    Chloride 96 (*)    BUN 39 (*)    Creatinine, Ser 8.71 (*)    GFR, Estimated 6 (*)    Anion gap 20 (*)    All other components within normal limits    EKG None  Radiology DG Chest 1 View  Result Date: 12/06/2021 CLINICAL DATA:  Provided history: Encounter for dialysis catheter care. EXAM: CHEST  1 VIEW COMPARISON:  Radiographs 11/12/2021 and earlier. FINDINGS: Unchanged position of a left-sided dialysis catheter with tip projecting at the level of the superior cavoatrial junction. Prior median sternotomy. Cardiomegaly. Hazy opacity within the left lung base. Minimal linear atelectasis within the right lung base. No sizable pleural effusion or evidence of pneumothorax. No acute bony abnormality identified. Surgical clips project in the region of the right axilla. IMPRESSION: 1. Unchanged position of a left-sided dialysis catheter with tip at the level of the superior cavoatrial junction. 2. Hazy opacity within the left lung base. While this may reflect atelectasis, pneumonia cannot be excluded. 3. Minimal linear atelectasis within the right lung base. 4. Cardiomegaly. Electronically Signed   By: Kellie Simmering D.O.   On: 12/06/2021 10:25     Procedures Procedures    Medications Ordered in ED Medications - No data to display  ED Course/ Medical Decision Making/ A&P                           Medical Decision Making Amount and/or Complexity of Data Reviewed Labs: ordered. Radiology: ordered.   61 year old male comes in with chief complaint of clogged dialysis port.  Patient has history of CAD, ESRD on hemodialysis -he goes to dialysis Tuesday, Thursday, Saturday.  Exam is overall reassuring from volume overload perspective. Basic metabolic profile has been ordered to ensure that patient is not hyperkalemic.  I have consulted  interventional radiology, they will change the dialysis catheter.  X-ray interpreted independently.  No evidence of volume overload.  Final Clinical Impression(s) / ED Diagnoses Final diagnoses:  Hemodialysis catheter dysfunction, initial encounter [facility])    Rx / DC Orders ED Discharge Orders     None         Varney Biles, MD 12/06/21 1240

## 2021-12-06 NOTE — Procedures (Signed)
  Procedure:  Exchange L IJ tunneled HD CVC, venogram Palindrome 23 Preprocedure diagnosis: The encounter diagnosis was Hemodialysis catheter dysfunction, initial encounter (Ewa Gentry).  Postprocedure diagnosis: same EBL:    minimal Complications:   none immediate  See full dictation in [employer].  Dillard Cannon MD Main # [phone] Pager  [phone] Mobile [phone]

## 2021-12-06 NOTE — ED Notes (Signed)
Pt transported to IR 

## 2021-12-06 NOTE — Discharge Instructions (Signed)
We spoke to the nephrologist on-call who said that it would be okay for you to go home and follow-up for your regularly scheduled dialysis tomorrow.  Please be sure to go to your next dialysis.

## 2021-12-06 NOTE — ED Provider Notes (Signed)
61 yo male here with malfunctioning dialysis site Pending IR placement of new tunneled catheter  Per EDP Dr Kathrynn Humble discussion with nephrology, patient could follow up for regularly scheduled dialysis tomorrow - does not need emergent dialysis today  Physical Exam  BP (!) 136/96   Pulse 92   Temp 98 F (36.7 C) (Oral)   Resp 16   Ht 5\' 7"  (1.702 m)   Wt 85.3 kg   SpO2 99%   BMI 29.44 kg/m   Physical Exam  Procedures  Procedures  ED Course / MDM    Medical Decision Making Amount and/or Complexity of Data Reviewed Labs: ordered. Radiology: ordered.   Patient returned from IR placement of catheter - okay for discharge with dialysis tomorrow       Wyvonnia Dusky, MD 12/06/21 [phone]

## 2021-12-23 ENCOUNTER — Telehealth: Payer: Self-pay | Admitting: *Deleted

## 2021-12-23 ENCOUNTER — Other Ambulatory Visit (HOSPITAL_COMMUNITY): Payer: Self-pay | Admitting: Family Medicine

## 2021-12-23 DIAGNOSIS — R1319 Other dysphagia: Secondary | ICD-10-CM

## 2021-12-24 ENCOUNTER — Other Ambulatory Visit: Payer: Self-pay | Admitting: Surgery

## 2021-12-24 DIAGNOSIS — Z9889 Other specified postprocedural states: Secondary | ICD-10-CM

## 2021-12-25 ENCOUNTER — Telehealth: Payer: Self-pay

## 2021-12-25 ENCOUNTER — Encounter: Payer: Self-pay | Admitting: Surgery

## 2021-12-25 ENCOUNTER — Ambulatory Visit (INDEPENDENT_AMBULATORY_CARE_PROVIDER_SITE_OTHER): Payer: Self-pay | Admitting: Surgery

## 2021-12-25 ENCOUNTER — Ambulatory Visit
Admission: RE | Admit: 2021-12-25 | Discharge: 2021-12-25 | Disposition: A | Discharge status: Home / Self Care | Payer: Self-pay | Source: Ambulatory Visit | Attending: Surgery | Admitting: Surgery

## 2021-12-25 VITALS — BP 134/93 | HR 94 | Resp 18 | Ht 67.0 in | Wt 185.0 lb

## 2021-12-25 DIAGNOSIS — Z9889 Other specified postprocedural states: Secondary | ICD-10-CM

## 2021-12-25 DIAGNOSIS — Z951 Presence of aortocoronary bypass graft: Secondary | ICD-10-CM

## 2021-12-25 NOTE — Progress Notes (Signed)
HPI:  The patient returns today for his first postoperative follow-up after discharge from the hospital.  He has a complicated 61 year old gentleman with history of poorly controlled diabetes and prior stroke as well as chronic kidney disease and severe coronary artery disease who underwent CABG x5 on 09/12/2021.  He had a slow postoperative recovery due to acute kidney injury and fluid retention.  He developed postoperative atrial fibrillation and was started on anticoagulation.  He recovered and was discharged feeling well but then returned with shortness of breath.  A 2D echocardiogram showed a pericardial effusion and he was taken the operating room by Dr. Kipp Brood for a pericardial window via a right VATS approach for drainage of the pericardial effusion. It was noted that there was significant clot upon entry into the pericardium as well as some serosanguineous fluid.  He was taken back to the intensive care unit but remained hemodynamically unstable and became unresponsive and lost his blood pressure requiring CPR.  After he regained his pulse a bedside echo showed a large posterior clot and he underwent mediastinal exploration by Dr. Kipp Brood.  The clot was evacuated with improvement in his hemodynamics.  The sternum was not closed.  He continued to have arrhythmias and cardiogenic shock and was taken back to the operating room for mediastinal reexploration and institution of VA ECMO by Dr. Kipp Brood.  He subsequently underwent to mediastinal explorations for bleeding and washout requiring large amounts of clotting factors.  He was on ECMO for 8 days and then was taken back to the operating room by me for insertion of an Impella 5.5 via the right axillary artery to provide continuing hemodynamic support after discontinuation of ECMO.  He did well after that with weaning of his inotropes and vasopressors and removal of a large amount of fluid with CRRT.  He was taken back to the operating room on  10/12/2021 for closure of his sternum.  He remained stable and was taken back to the operating room on 10/17/2021 for removal of the right axillary Impella.  He made a gradual recovery and was weaned off the ventilator and his tracheostomy decannulated.  He remained on CRRT and was transition to intermittent hemodialysis.  He was discharged to a skilled nursing facility on 12/03/2021.  He reports that he is still at the skilled nursing facility.  He feels well without chest pain or shortness of breath.  He is still doing intermittent hemodialysis.  He is able to ambulate with a walker.  He still has difficulty getting from a seated position to standing but once he is standing he feels well with ambulating.  He is still getting dressing changes to his sacral pressure sore but it is healing in.  This is being followed in the wound clinic.  His main complaint is that he has weakness in his right hand and cannot make a grip and this is his dominant hand.  This may have been related to the right axillary Impella in proximity to the brachial plexus or possibly some peripheral nerve injury from positioning.  He has no problems using his elbow or shoulder with good strength and full range of motion at those joints.  Current Outpatient Medications  Medication Sig Dispense Refill   acetaminophen (TYLENOL) 325 MG tablet Take 2 tablets (650 mg total) by mouth every 6 (six) hours as needed for mild pain, headache or fever.     ascorbic acid (VITAMIN C) 500 MG tablet Take 500 mg by mouth daily.  aspirin 81 MG chewable tablet Chew 1 tablet (81 mg total) by mouth daily.     atorvastatin (LIPITOR) 80 MG tablet Take 1 tablet (80 mg total) by mouth daily. 30 tablet 2   Continuous Blood Gluc Sensor (FREESTYLE LIBRE 2 SENSOR) MISC Utilize as directed q 14 days to monitor blood sugar. 2 each 1   folic acid (FOLVITE) 1 MG tablet Take 1 tablet (1 mg total) by mouth daily.     glucose blood (TRUE METRIX BLOOD GLUCOSE TEST) test  strip Use to check blood sugar three times daily. 100 each 2   insulin aspart (NOVOLOG) 100 UNIT/ML injection Inject 0-6 Units into the skin every 4 (four) hours. CBG 70-150 ( 0 units) 151-200 (1 unit) 201-250 (2 units) 251-300 ( 3 units) 301-350 ( 4 units) 351-400 (5 units) >400 ( 6 units) 10 mL 11   Insulin Pen Needle (TRUEPLUS 5-BEVEL PEN NEEDLES) 32G X 4 MM MISC Use to inject Basaglar once daily. 100 each 3   leptospermum manuka honey (MEDIHONEY) PSTE paste Apply 1 Application topically daily. Apply Medihoney to sacrum/bilat buttock wound Q day, then cover with gauze and abd pad and tape  Apply thin layer (3 mm) to wound.     midodrine (PROAMATINE) 10 MG tablet Take 1 tablet (10 mg total) by mouth 3 (three) times daily with meals.     multivitamin (RENA-VIT) TABS tablet Take 1 tablet by mouth at bedtime.  0   Nutritional Supplements (FEEDING SUPPLEMENT, OSMOLITE 1.5 CAL,) LIQD Place 237 mLs into feeding tube 4 (four) times daily. Bolus a full carton of Osmolite 1.5 (237 ml) formula via GRAVITY per PEG using a 50-60 cc syringe. Flush PEG with 20 ml before and after each bolus.  Pt is willing to take bolus tube feeds. He would prefer not to be asked each time whether he wants the bolus feed but instead would like it to be administered at the scheduled time.  0   pantoprazole (PROTONIX) 40 MG tablet Take 1 tablet (40 mg total) by mouth 2 (two) times daily.     polyvinyl alcohol (LIQUIFILM TEARS) 1.4 % ophthalmic solution Place 2 drops into both eyes as needed for dry eyes. 15 mL 0   QUEtiapine (SEROQUEL) 50 MG tablet Take 1 tablet (50 mg total) by mouth at bedtime. 30 tablet 0   thiamine (VITAMIN B-1) 100 MG tablet Take 1 tablet (100 mg total) by mouth daily.     TRUEplus Lancets 28G MISC Use to check blood sugar three times daily. 100 each 2   zinc gluconate 50 MG tablet Take 50 mg by mouth daily.     No current facility-administered medications for this visit.     Physical Exam: BP  (!) 134/93 (BP Location: Right Arm, Patient Position: Sitting)   Pulse 94   Resp 18   Ht 5\' 7"  (1.702 m)   Wt 185 lb (83.9 kg)   SpO2 97% Comment: RA  BMI 28.98 kg/m  He looks well. Cardiac exam shows a regular rate and rhythm with normal heart sounds.  There is no murmur. Lungs are clear. The chest incision is well-healed and the sternum is stable.  The right axillary incision is well-healed. There is no peripheral edema. He does have weakness of his right hand and cannot make a grip.  He has normal range of motion and strength at his elbow and shoulder joints.  Diagnostic Tests:  CLINICAL DATA:  Pericardial window.   EXAM: CHEST - 2 VIEW  COMPARISON:  AP chest 12/06/2021, CT chest 09/28/2021   FINDINGS: Status post median sternotomy. Left internal jugular dual-lumen central venous catheter tips again overlie the superior vena cava/right atrial junction. Cardiac silhouette is again moderately enlarged. Left mid to lower lung horizontal linear IV assessment atelectasis versus scarring, similar to prior CT. No pleural effusion pneumothorax. Mild multilevel degenerative disc changes of the midthoracic spine. Right axillary surgical clips.   IMPRESSION: Unchanged moderate enlargement of the cardiac silhouette.   No acute lung process.     Electronically Signed   By: Yvonne Kendall M.D.   On: 12/25/2021 13:33    Impression:  Overall I think he has made a miraculous recovery following his extremely complicated course.  He continues on intermittent hemodialysis.  The goal at this time is continued physical therapy to get him stronger as well as continue dressing changes of his sacral pressure sore to get that completely healed.  He has not seen cardiology in follow-up and we will schedule an appointment for him.  Plan:  He will have a cardiology follow-up appointment scheduled.  I will plan to see him back in 1 month for follow-up.   [name], MD [facility] [phone]

## 2021-12-25 NOTE — Telephone Encounter (Signed)
Received a message from Levonne Spiller, RN at [facility] regarding pt needing a f/u cardiology office visit. Left message for pt to call back.

## 2021-12-27 ENCOUNTER — Other Ambulatory Visit: Payer: Self-pay

## 2021-12-27 ENCOUNTER — Encounter: Payer: Self-pay | Admitting: Surgery

## 2021-12-27 NOTE — Telephone Encounter (Signed)
I have attempted without success to contact this patient by phone to return their call and I left a message on answering machine.

## 2021-12-27 NOTE — Telephone Encounter (Signed)
Copied from [place] [phone]. Topic: Medical Record Request - Patient ROI Request >> Dec 26, 2021  3:48 PM [name] wrote: Reason for CRM: The patient has called to request a letter stating that they're disability makes them unable to go to the [facility]  The patient would like for the letter to be mailed to  [address] [place], [place] [address]  Please contact the patient further if needed

## 2021-12-27 NOTE — Telephone Encounter (Signed)
I have attempted to contact this patient by phone with the following results: message left to return my call . Copied from [place] [phone]. Topic: Medical Record Request - Patient ROI Request >> Dec 26, 2021  3:48 PM [name] wrote: Reason for CRM: The patient has called to request a letter stating that they're disability makes them unable to go to the [facility]  The patient would like for the letter to be mailed to  [address] [place], [place] [address]  Please contact the patient further if needed

## 2021-12-30 ENCOUNTER — Encounter (HOSPITAL_BASED_OUTPATIENT_CLINIC_OR_DEPARTMENT_OTHER): Payer: Self-pay | Attending: Internal Medicine | Admitting: Internal Medicine

## 2021-12-30 DIAGNOSIS — L89153 Pressure ulcer of sacral region, stage 3: Secondary | ICD-10-CM

## 2021-12-30 DIAGNOSIS — E1122 Type 2 diabetes mellitus with diabetic chronic kidney disease: Secondary | ICD-10-CM | POA: Insufficient documentation

## 2021-12-30 DIAGNOSIS — Z7901 Long term (current) use of anticoagulants: Secondary | ICD-10-CM | POA: Insufficient documentation

## 2021-12-30 DIAGNOSIS — E11622 Type 2 diabetes mellitus with other skin ulcer: Secondary | ICD-10-CM

## 2021-12-30 DIAGNOSIS — Z992 Dependence on renal dialysis: Secondary | ICD-10-CM | POA: Insufficient documentation

## 2021-12-30 DIAGNOSIS — I252 Old myocardial infarction: Secondary | ICD-10-CM | POA: Insufficient documentation

## 2021-12-30 DIAGNOSIS — I4891 Unspecified atrial fibrillation: Secondary | ICD-10-CM | POA: Insufficient documentation

## 2021-12-30 DIAGNOSIS — Z951 Presence of aortocoronary bypass graft: Secondary | ICD-10-CM

## 2021-12-30 DIAGNOSIS — N186 End stage renal disease: Secondary | ICD-10-CM

## 2021-12-30 NOTE — Progress Notes (Addendum)
[name], [name] ([id number]) [id number] Page 1 of 8 Visit Report for 12/30/2021 Allergy List Details Patient Name: Date of Service: [name], [name] 12/30/2021 9:45 A M Medical Record Number: [id number] Patient Account Number: [id number] Date of Birth/Sex: Treating RN: 10/24/1960 (61 y.o. Burnadette Pop, Lauren Primary Care Imelda Dandridge: [name] Other Clinician: Referring Onesha Krebbs: Treating Immanuel Fedak/Extender: Vista Mink Weeks in Treatment: 0 Allergies Active Allergies No Known Allergies Allergy Notes Electronic Signature(s) Signed: 12/30/2021 4:39:11 PM By: Erenest Blank Entered By: Erenest Blank on 12/30/2021 09:41:56 -------------------------------------------------------------------------------- Arrival Information Details Patient Name: Date of Service: [name], [name] 12/30/2021 9:45 A M Medical Record Number: [id number] Patient Account Number: [id number] Date of Birth/Sex: Treating RN: 03/24/1960 (61 y.o. M) Primary Care Mads Borgmeyer: [name] Other Clinician: Referring Dyesha Henault: Treating Jameir Ake/Extender: Ulla Potash in Treatment: 0 Visit Information Patient Arrived: Wheel Chair Arrival Time: 09:33 Accompanied By: self Transfer Assistance: None Patient Identification Verified: Yes Secondary Verification Process Completed: Yes Patient Requires Transmission-Based Precautions: No Patient Has Alerts: Yes Patient Alerts: Patient on Blood Thinner DO NOT USE LEFT ARM Electronic Signature(s) Signed: 12/30/2021 4:39:11 PM By: Erenest Blank Entered By: Erenest Blank on 12/30/2021 09:40:57 Clinic Level of Care Assessment Details -------------------------------------------------------------------------------- Rudean Curt ([id number]) [id number] Page 2 of 8 Patient Name: Date of Service: [name], [name] 12/30/2021 9:45 A M Medical Record Number: [id number] Patient  Account Number: [id number] Date of Birth/Sex: Treating RN: [birth year] (61 y.o. Burnadette Pop, Lauren Primary Care Hank Walling: [name] Other Clinician: Referring Rehmat Murtagh: Treating Isaid Salvia/Extender: Ulla Potash in Treatment: 0 Clinic Level of Care Assessment Items TOOL 4 Quantity Score X- 1 0 Use when only an EandM is performed on FOLLOW-UP visit ASSESSMENTS - Nursing Assessment / Reassessment X- 1 10 Reassessment of Co-morbidities (includes updates in patient status) X- 1 5 Reassessment of Adherence to Treatment Plan ASSESSMENTS - Wound and Skin A ssessment / Reassessment X - Simple Wound Assessment / Reassessment - one wound 1 5 []  - 0 Complex Wound Assessment / Reassessment - multiple wounds []  - 0 Dermatologic / Skin Assessment (not related to wound area) ASSESSMENTS - Focused Assessment []  - 0 Circumferential Edema Measurements - multi extremities []  - 0 Nutritional Assessment / Counseling / Intervention []  - 0 Lower Extremity Assessment (monofilament, tuning fork, pulses) []  - 0 Peripheral Arterial Disease Assessment (using hand held doppler) ASSESSMENTS - Ostomy and/or Continence Assessment and Care []  - 0 Incontinence Assessment and Management []  - 0 Ostomy Care Assessment and Management (repouching, etc.) PROCESS - Coordination of Care X - Simple Patient / Family Education for ongoing care 1 15 []  - 0 Complex (extensive) Patient / Family Education for ongoing care X- 1 10 Staff obtains Programmer, systems, Records, T Results / Process Orders est []  - 0 Staff telephones HHA, Nursing Homes / Clarify orders / etc []  - 0 Routine Transfer to another Facility (non-emergent condition) []  - 0 Routine Hospital Admission (non-emergent condition) X- 1 15 New Admissions / Biomedical engineer / Ordering NPWT Apligraf, etc. , []  - 0 Emergency Hospital Admission (emergent condition) X- 1 10 Simple Discharge Coordination []  - 0 Complex  (extensive) Discharge Coordination PROCESS - Special Needs []  - 0 Pediatric / Minor Patient Management []  - 0 Isolation Patient Management []  - 0 Hearing / Language / Visual special needs []  - 0 Assessment of Community assistance (transportation, D/C planning, etc.) []  - 0 Additional assistance / Altered mentation []  - 0 Support Surface(s) Assessment (bed, cushion,  seat, etc.) INTERVENTIONS - Wound Cleansing / Measurement X - Simple Wound Cleansing - one wound 1 5 []  - 0 Complex Wound Cleansing - multiple wounds X- 1 5 Wound Imaging (photographs - any number of wounds) []  - 0 Wound Tracing (instead of photographs) X- 1 5 Simple Wound Measurement - one wound ETHANIEL, GARFIELD ([id number]) [id number] Page 3 of 8 []  - 0 Complex Wound Measurement - multiple wounds INTERVENTIONS - Wound Dressings X - Small Wound Dressing one or multiple wounds 1 10 []  - 0 Medium Wound Dressing one or multiple wounds []  - 0 Large Wound Dressing one or multiple wounds X- 1 5 Application of Medications - topical []  - 0 Application of Medications - injection INTERVENTIONS - Miscellaneous []  - 0 External ear exam []  - 0 Specimen Collection (cultures, biopsies, blood, body fluids, etc.) []  - 0 Specimen(s) / Culture(s) sent or taken to Lab for analysis []  - 0 Patient Transfer (multiple staff / Civil Service fast streamer / Similar devices) []  - 0 Simple Staple / Suture removal (25 or less) []  - 0 Complex Staple / Suture removal (26 or more) []  - 0 Hypo / Hyperglycemic Management (close monitor of Blood Glucose) []  - 0 Ankle / Brachial Index (ABI) - do not check if billed separately X- 1 5 Vital Signs Has the patient been seen at the hospital within the last three years: Yes Total Score: 105 Level Of Care: New/Established - Level 3 Electronic Signature(s) Signed: 12/30/2021 4:45:06 PM By: Rhae Hammock RN Entered By: Rhae Hammock on 12/30/2021  12:14:53 -------------------------------------------------------------------------------- Encounter Discharge Information Details Patient Name: Date of Service: [name], [name] 12/30/2021 9:45 A M Medical Record Number: [id number] Patient Account Number: [id number] Date of Birth/Sex: Treating RN: July 26, 1960 (61 y.o. Burnadette Pop, Lauren Primary Care Blong Busk: [name] Other Clinician: Referring Pacer Dorn: Treating Steaven Wholey/Extender: Ulla Potash in Treatment: 0 Encounter Discharge Information Items Discharge Condition: Stable Ambulatory Status: Walker Discharge Destination: Home Transportation: Private Auto Accompanied By: self Schedule Follow-up Appointment: Yes Clinical Summary of Care: Patient Declined Electronic Signature(s) Signed: 12/30/2021 4:45:06 PM By: Rhae Hammock RN Entered By: Rhae Hammock on 12/30/2021 12:15:45 Rudean Curt ([id number]) [id number] Page 4 of 8 -------------------------------------------------------------------------------- Lower Extremity Assessment Details Patient Name: Date of Service: [name], [name] 12/30/2021 9:45 A M Medical Record Number: [id number] Patient Account Number: [id number] Date of Birth/Sex: Treating RN: Dec 12, 1960 (61 y.o. M) Primary Care Jessalyn Hinojosa: [name] Other Clinician: Referring Tashiana Lamarca: Treating Ollen Rao/Extender: Vista Mink Weeks in Treatment: 0 Electronic Signature(s) Signed: 12/30/2021 4:39:11 PM By: Erenest Blank Entered By: Erenest Blank on 12/30/2021 09:53:35 -------------------------------------------------------------------------------- Multi Wound Chart Details Patient Name: Date of Service: [name], [name] 12/30/2021 9:45 A M Medical Record Number: [id number] Patient Account Number: [id number] Date of Birth/Sex: Treating RN: Apr 03, 1960 (61 y.o. M) Primary Care Kaitlyne Friedhoff: [name] Other Clinician: Referring  Elisheva Fallas: Treating Khiree Bukhari/Extender: Vista Mink Weeks in Treatment: 0 Vital Signs Height(in): 67 Capillary Blood Glucose(mg/dl): 111 Weight(lbs): 180 Pulse(bpm): 91 Body Mass Index(BMI): 28.2 Blood Pressure(mmHg): 133/87 Temperature(F): 98.5 Respiratory Rate(breaths/min): 18 [1:Photos:] [N/A:N/A] Sacrum N/A N/A Wound Location: Pressure Injury N/A N/A Wounding Event: Pressure Ulcer N/A N/A Primary Etiology: Coronary Artery Disease, N/A N/A Comorbid History: Hypertension, Myocardial Infarction, Type II Diabetes, End Stage Renal Disease 09/10/2021 N/A N/A Date Acquired: 0 N/A N/A Weeks of Treatment: Open N/A N/A Wound Status: No N/A N/A Wound Recurrence: 6.5x3x2.5 N/A N/A Measurements L x W x D (cm) 15.315 N/A N/A A (  cm) : rea 38.288 N/A N/A Volume (cm) : Category/Stage II N/A N/A Classification: Medium N/A N/A Exudate A mount: Serosanguineous N/A N/A Exudate Type: red, brown N/A N/A Exudate Color: Distinct, outline attached N/A N/A Wound Margin: Large (67-100%) N/A N/A Granulation A mount: Red, Pink N/A N/A Granulation Quality: None Present (0%) N/A N/A Necrotic A mount: Rudean Curt ([id number]) [id number] Page 5 of 8 Fat Layer (Subcutaneous Tissue): Yes N/A N/A Exposed Structures: Fascia: No Tendon: No Muscle: No Joint: No Bone: No None N/A N/A Epithelialization: Excoriation: No N/A N/A Periwound Skin Texture: Induration: No Callus: No Crepitus: No Rash: No Scarring: No Maceration: No N/A N/A Periwound Skin Moisture: Dry/Scaly: No Atrophie Blanche: No N/A N/A Periwound Skin Color: Cyanosis: No Ecchymosis: No Erythema: No Hemosiderin Staining: No Mottled: No Pallor: No Rubor: No No Abnormality N/A N/A Temperature: Yes N/A N/A Tenderness on Palpation: Treatment Notes Electronic Signature(s) Signed: 12/30/2021 11:34:26 AM By: Kalman Shan DO Entered By: Kalman Shan on  12/30/2021 10:52:43 -------------------------------------------------------------------------------- Multi-Disciplinary Care Plan Details Patient Name: Date of Service: [name], [name] 12/30/2021 9:45 A M Medical Record Number: [id number] Patient Account Number: [id number] Date of Birth/Sex: Treating RN: 10-Mar-1960 (61 y.o. Burnadette Pop, Lauren Primary Care Joselyne Spake: [name] Other Clinician: Referring Joi Leyva: Treating Cleave Ternes/Extender: Vista Mink Weeks in Treatment: 0 Active Inactive Electronic Signature(s) Signed: 02/28/2022 9:07:16 PM By: Deon Pilling RN, BSN Signed: 03/05/2022 4:10:42 PM By: Rhae Hammock RN Previous Signature: 12/30/2021 4:45:06 PM Version By: Rhae Hammock RN Entered By: Deon Pilling on 02/28/2022 21:07:15 -------------------------------------------------------------------------------- Pain Assessment Details Patient Name: Date of Service: [name], [name] 12/30/2021 9:45 A M Medical Record Number: [id number] Patient Account Number: [id number] Date of Birth/Sex: Treating RN: 1960-04-02 (61 y.o. Burnadette Pop, Lauren Primary Care Giorgio Chabot: [name] Other Clinician: Referring Jorryn Casagrande: Treating Takeshia Wenk/Extender: Ulla Potash in Treatment: 0 Active Problems BURHAN, BARHAM ([id number]) [id number] Page 6 of 8 Location of Pain Severity and Description of Pain Patient Has Paino No Site Locations Pain Management and Medication Current Pain Management: Electronic Signature(s) Signed: 12/30/2021 4:45:06 PM By: Rhae Hammock RN Entered By: Rhae Hammock on 12/30/2021 10:13:41 -------------------------------------------------------------------------------- Patient/Caregiver Education Details Patient Name: Date of Service: [name], [name] 11/20/2023andnbsp9:45 A M Medical Record Number: [id number] Patient Account Number: [id number] Date of Birth/Gender: Treating  RN: [birth year] (61 y.o. Erie Noe Primary Care Physician: [name] Other Clinician: Referring Physician: Treating Physician/Extender: Ulla Potash in Treatment: 0 Education Assessment Education Provided To: Patient Education Topics Provided Pressure: Methods: Explain/Verbal Responses: Reinforcements needed, State content correctly [place]: o Methods: Explain/Verbal Responses: Reinforcements needed, State content correctly Electronic Signature(s) Signed: 12/30/2021 4:45:06 PM By: Rhae Hammock RN Entered By: Rhae Hammock on 12/30/2021 10:14:43 Rudean Curt ([id number]) [id number] Page 7 of 8 -------------------------------------------------------------------------------- Wound Assessment Details Patient Name: Date of Service: [name], [name] 12/30/2021 9:45 A M Medical Record Number: [id number] Patient Account Number: [id number] Date of Birth/Sex: Treating RN: [birth year] (61 y.o. Burnadette Pop, Lauren Primary Care Ariday Brinker: [name] Other Clinician: Referring Keevan Wolz: Treating Anely Spiewak/Extender: Vista Mink Weeks in Treatment: 0 Wound Status Wound Number: 1 Primary Pressure Ulcer Etiology: Wound Location: Sacrum Wound Open Wounding Event: Pressure Injury Status: Date Acquired: 09/10/2021 Comorbid Coronary Artery Disease, Hypertension, Myocardial Infarction, Weeks Of Treatment: 0 History: Type II Diabetes, End Stage Renal Disease Clustered Wound: No Photos Wound Measurements Length: (cm) 6.5 Width: (cm) 3 Depth: (cm) 2.5 Area: (cm)  15.315 Volume: (cm) 38.288 % Reduction in Area: % Reduction in Volume: Epithelialization: None Tunneling: No Undermining: No Wound Description Classification: Category/Stage II Wound Margin: Distinct, outline attached Exudate Amount: Medium Exudate Type: Serosanguineous Exudate Color: red, brown Foul  Odor After Cleansing: No Slough/Fibrino No Wound Bed Granulation Amount: Large (67-100%) Exposed Structure Granulation Quality: Red, Pink Fascia Exposed: No Necrotic Amount: None Present (0%) Fat Layer (Subcutaneous Tissue) Exposed: Yes Tendon Exposed: No Muscle Exposed: No Joint Exposed: No Bone Exposed: No Periwound Skin Texture Texture Color No Abnormalities Noted: No No Abnormalities Noted: No Callus: No Atrophie Blanche: No Crepitus: No Cyanosis: No Excoriation: No Ecchymosis: No Induration: No Erythema: No Rash: No Hemosiderin Staining: No Scarring: No Mottled: No Pallor: No Moisture Rubor: No No Abnormalities Noted: No Dry / Scaly: No Temperature / Pain Maceration: No Temperature: No Abnormality Tenderness on PalpationJACY, HOWAT ([id number]) [id number] Page 8 of 8 Electronic Signature(s) Signed: 12/30/2021 4:39:11 PM By: Erenest Blank Signed: 12/30/2021 4:45:06 PM By: Rhae Hammock RN Entered By: Erenest Blank on 12/30/2021 10:08:35 -------------------------------------------------------------------------------- Vitals Details Patient Name: Date of Service: [name], [name] 12/30/2021 9:45 A M Medical Record Number: [id number] Patient Account Number: [id number] Date of Birth/Sex: Treating RN: 11-23-1960 (61 y.o. M) Primary Care Lylliana Kitamura: [name] Other Clinician: Referring Zeppelin Commisso: Treating Latisia Hilaire/Extender: Vista Mink Weeks in Treatment: 0 Vital Signs Time Taken: 09:41 Temperature (F): 98.5 Height (in): 67 Pulse (bpm): 91 Source: Stated Respiratory Rate (breaths/min): 18 Weight (lbs): 180 Blood Pressure (mmHg): 133/87 Source: Stated Capillary Blood Glucose (mg/dl): 111 Body Mass Index (BMI): 28.2 Reference Range: 80 - 120 mg / dl Electronic Signature(s) Signed: 12/30/2021 4:39:11 PM By: Erenest Blank Entered By: Erenest Blank on 12/30/2021 09:41:30

## 2021-12-30 NOTE — Progress Notes (Signed)
DONA, KLEMANN ([id number]) [id number] Page 1 of 8 Visit Report for 12/30/2021 Chief Complaint Document Details Patient Name: Date of Service: [name], [name] 12/30/2021 9:45 A M Medical Record Number: [id number] Patient Account Number: [id number] Date of Birth/Sex: Treating RN: [birth year] (61 y.o. M) Primary Care Provider: [name] Other Clinician: Referring Provider: Treating Provider/Extender: Vista Mink Weeks in Treatment: 0 Information Obtained from: Patient Chief Complaint 12/30/2021; sacral wound Electronic Signature(s) Signed: 12/30/2021 11:34:26 AM By: Kalman Shan DO Entered By: Kalman Shan on 12/30/2021 10:52:58 -------------------------------------------------------------------------------- HPI Details Patient Name: Date of Service: [name], [name] 12/30/2021 9:45 A M Medical Record Number: [id number] Patient Account Number: [id number] Date of Birth/Sex: Treating RN: March 23, 1960 (61 y.o. M) Primary Care Provider: [name] Other Clinician: Referring Provider: Treating Provider/Extender: Ulla Potash in Treatment: 0 History of Present Illness HPI Description: Admission 12/30/2021 [name] is a 61 year old male with a past medical history of type 2 diabetes, atrial fibrillation on Eliquis, CABG x5, that presents to the clinic for a 2- month history of nonhealing wound to his sacrum. He states he developed this while he was in the hospital. On 09/28/2021 he was admitted to the ICU for pericardial effusion with tamponade. He required a tracheotomy, PEG tube and even ECMO at one point. He currently does not have a tracheostomy. He was discharged on 12/03/2021 and has been residing in [place]. They have been using Medihoney to the wound bed. Patient states he is mobile and is able to reposition in bed when needed. He is building back his strength. He states that the wound has  been improving. He currently denies signs of infection. Electronic Signature(s) Signed: 12/30/2021 11:34:26 AM By: Kalman Shan DO Entered By: Kalman Shan on 12/30/2021 10:57:54 -------------------------------------------------------------------------------- Physical Exam Details Patient Name: Date of Service: [name], [name] 12/30/2021 9:45 A M Medical Record Number: [id number] Patient Account Number: [id number] Date of Birth/Sex: Treating RN: [birth year] (61 y.o. M) Primary Care Provider: [name] Other Clinician: Rudean Curt ([id number]) [id number] Page 2 of 8 Referring Provider: Treating Provider/Extender: Ulla Potash in Treatment: 0 Constitutional respirations regular, non-labored and within target range for patient.Marland Kitchen Psychiatric pleasant and cooperative. Notes Sacrum: Open wound with granulation tissue and subcutaneous tissue. Tunneling noted. No signs of surrounding infection. Electronic Signature(s) Signed: 12/30/2021 11:34:26 AM By: Kalman Shan DO Entered By: Kalman Shan on 12/30/2021 10:58:25 -------------------------------------------------------------------------------- Physician Orders Details Patient Name: Date of Service: [name], [name] 12/30/2021 9:45 A M Medical Record Number: [id number] Patient Account Number: [id number] Date of Birth/Sex: Treating RN: [birth year] (61 y.o. M) Primary Care Provider: [name] Other Clinician: Referring Provider: Treating Provider/Extender: Ulla Potash in Treatment: 0 Verbal / Phone Orders: No Diagnosis Coding ICD-10 Coding Code Description L89.153 Pressure ulcer of sacral region, stage 3 N18.6 End stage renal disease Z95.1 Presence of aortocoronary bypass graft E11.622 Type 2 diabetes mellitus with other skin ulcer Follow-up Appointments ppointment in 2 weeks. - w/ Dr. Heber [place] Return A Anesthetic (In clinic)  Topical Lidocaine 5% applied to wound bed Bathing/ Shower/ Hygiene May shower with protection but do not get wound dressing(s) wet. Off-Loading Turn and reposition every 2 hours Wound Treatment Wound #1 - Sacrum Cleanser: Vashe 5.8 (oz) Discharge Instructions: Cleanse the wound with Vashe prior to applying a clean dressing using gauze sponges, not tissue or cotton balls. Prim Dressing: Hydrofera Blue Ready Foam, 4x5 in ary Discharge Instructions: Apply to wound  bed as instructed Prim Dressing: MediHoney Gel, tube 1.5 (oz) ary Discharge Instructions: Apply to wound bed as instructed Secondary Dressing: ABD Pad, 5x9 Discharge Instructions: Apply over primary dressing as directed. Secured With: 21M Medipore H Soft Cloth Surgical T ape, 4 x 10 (in/yd) Discharge Instructions: Secure with tape as directed. Mark, MCGLOCKLIN ([id number]) [id number] Page 3 of 8 Patient Medications llergies: No Known Allergies A Notifications Medication Indication Start End PRN debridement/pain 12/30/2021 lidocaine DOSE topical 5 % gel - gel topical once daily Electronic Signature(s) Signed: 12/30/2021 11:34:26 AM By: Kalman Shan DO Entered By: Kalman Shan on 12/30/2021 10:58:37 -------------------------------------------------------------------------------- Problem List Details Patient Name: Date of Service: [name], [name] 12/30/2021 9:45 A M Medical Record Number: [id number] Patient Account Number: [id number] Date of Birth/Sex: Treating RN: 02/02/1961 (61 y.o. M) Primary Care Provider: [name] Other Clinician: Referring Provider: Treating Provider/Extender: Vista Mink Weeks in Treatment: 0 Active Problems ICD-10 Encounter Code Description Active Date MDM Diagnosis L89.153 Pressure ulcer of sacral region, stage 3 12/30/2021 No Yes N18.6 End stage renal disease 12/30/2021 No Yes Z95.1 Presence of aortocoronary bypass graft 12/30/2021  No Yes E11.622 Type 2 diabetes mellitus with other skin ulcer 12/30/2021 No Yes Inactive Problems Resolved Problems Electronic Signature(s) Signed: 12/30/2021 11:34:26 AM By: Kalman Shan DO Entered By: Kalman Shan on 12/30/2021 10:52:34 -------------------------------------------------------------------------------- Progress Note Details Patient Name: Date of Service: [place] NN, [name] 12/30/2021 9:45 A M Medical Record Number: [id number] Patient Account Number: [id number] Date of Birth/Sex: Treating RN: October 20, 1960 (61 y.o. Tresa Garter ([id number]) [id number] Page 4 of 8 Primary Care Provider: [name] Other Clinician: Referring Provider: Treating Provider/Extender: Ulla Potash in Treatment: 0 Subjective Chief Complaint Information obtained from Patient 12/30/2021; sacral wound History of Present Illness (HPI) Admission 12/30/2021 [name] is a 61 year old male with a past medical history of type 2 diabetes, atrial fibrillation on Eliquis, CABG x5, that presents to the clinic for a 2- month history of nonhealing wound to his sacrum. He states he developed this while he was in the hospital. On 09/28/2021 he was admitted to the ICU for pericardial effusion with tamponade. He required a tracheotomy, PEG tube and even ECMO at one point. He currently does not have a tracheostomy. He was discharged on 12/03/2021 and has been residing in [place]. They have been using Medihoney to the wound bed. Patient states he is mobile and is able to reposition in bed when needed. He is building back his strength. He states that the wound has been improving. He currently denies signs of infection. Patient History Information obtained from Patient, Chart. Allergies No Known Allergies Family History Cancer - Mother, Diabetes - Mother, No family history of Heart Disease, Hereditary Spherocytosis, Hypertension, Kidney  Disease, Lung Disease, Seizures, Stroke, Thyroid Problems, Tuberculosis. Social History Former smoker, Marital Status - Married, Alcohol Use - Never, Drug Use - No History, Caffeine Use - Rarely. Medical History Hematologic/Lymphatic Denies history of Anemia, Hemophilia, Human Immunodeficiency Virus, Lymphedema, Sickle Cell Disease Respiratory Denies history of Aspiration, Asthma, Chronic Obstructive Pulmonary Disease (COPD), Pneumothorax, Sleep Apnea, Tuberculosis Cardiovascular Patient has history of Coronary Artery Disease, Hypertension, Myocardial Infarction - NSTEMI Denies history of Angina, Arrhythmia, Congestive Heart Failure, Deep Vein Thrombosis, Hypotension, Peripheral Arterial Disease, Peripheral Venous Disease, Phlebitis, Vasculitis Gastrointestinal Denies history of Cirrhosis , Colitis, Crohnoos, Hepatitis A, Hepatitis B, Hepatitis C Endocrine Patient has history of Type II Diabetes Denies history of Type I Diabetes Genitourinary Patient has history  of End Stage Renal Disease - dialysis Immunological Denies history of Lupus Erythematosus, Raynaudoos, Scleroderma Integumentary (Skin) Denies history of History of Burn Musculoskeletal Denies history of Gout, Rheumatoid Arthritis, Osteoarthritis, Osteomyelitis Neurologic Denies history of Dementia, Neuropathy, Quadriplegia, Paraplegia, Seizure Disorder Oncologic Denies history of Received Chemotherapy, Received Radiation Patient is treated with Oral Agents. Blood sugar is tested. Hospitalization/Surgery History - g-tube. - percutaneous trach. - cardioversion/TEE. - CV line left IR fluoro. - mediastinal exploration. - left heart cath and coronary angiography. - coronary arter bypass graft. - cannulation for ecmo. Medical A Surgical History Notes nd Cardiovascular hypercholesterolemia Gastrointestinal gerd Genitourinary aki; Neurologic hx stroke Review of Systems (ROS) Constitutional Symptoms (General  Health) Denies complaints or symptoms of Fatigue, Fever, Chills, Marked Weight Change. Eyes Complains or has symptoms of Glasses / Contacts - Does not have them. Denies complaints or symptoms of Dry Eyes, Vision Changes. Ear/Nose/Mouth/Throat Denies complaints or symptoms of Chronic sinus problems or rhinitis. Respiratory Complains or has symptoms of Shortness of Breath. Denies complaints or symptoms of Chronic or frequent coughs. Cardiovascular Complains or has symptoms of Chest pain - hx. Mark, Reyes ([id number]) [id number] Page 5 of 8 Gastrointestinal Denies complaints or symptoms of Frequent diarrhea, Nausea, Vomiting. Integumentary (Skin) Complains or has symptoms of Wounds - sacral. Musculoskeletal Denies complaints or symptoms of Muscle Pain, Muscle Weakness. Psychiatric Denies complaints or symptoms of Claustrophobia. Objective Constitutional respirations regular, non-labored and within target range for patient.. Vitals Time Taken: 9:41 AM, Height: 67 in, Source: Stated, Weight: 180 lbs, Source: Stated, BMI: 28.2, Temperature: 98.5 F, Pulse: 91 bpm, Respiratory Rate: 18 breaths/min, Blood Pressure: 133/87 mmHg, Capillary Blood Glucose: 111 mg/dl. Psychiatric pleasant and cooperative. General Notes: Sacrum: Open wound with granulation tissue and subcutaneous tissue. Tunneling noted. No signs of surrounding infection. Integumentary (Hair, Skin) Wound #1 status is Open. Original cause of wound was Pressure Injury. The date acquired was: 09/10/2021. The wound is located on the Sacrum. The wound measures 6.5cm length x 3cm width x 2.5cm depth; 15.315cm^2 area and 38.288cm^3 volume. There is Fat Layer (Subcutaneous Tissue) exposed. There is no tunneling or undermining noted. There is a medium amount of serosanguineous drainage noted. The wound margin is distinct with the outline attached to the wound base. There is large (67-100%) red, pink granulation  within the wound bed. There is no necrotic tissue within the wound bed. The periwound skin appearance did not exhibit: Callus, Crepitus, Excoriation, Induration, Rash, Scarring, Dry/Scaly, Maceration, Atrophie Blanche, Cyanosis, Ecchymosis, Hemosiderin Staining, Mottled, Pallor, Rubor, Erythema. Periwound temperature was noted as No Abnormality. The periwound has tenderness on palpation. Assessment Active Problems ICD-10 Pressure ulcer of sacral region, stage 3 End stage renal disease Presence of aortocoronary bypass graft Type 2 diabetes mellitus with other skin ulcer Patient presents with a 21-month history of nonhealing ulcer to the sacrum secondary to pressure while he was hospitalized. This was a complicated inpatient admission. Currently he resides in [place] [facility]. They have been using Medihoney. I recommended continuing this and adding Hydrofera Blue. Use Vashe wound cleanser in between dressing changes. We discussed the importance of aggressive offloading to help with his wound healing. I recommended repositioning every 1-2 hours when sitting in a chair or in the bed. He states he is mobile and is able to ambulate on his own. Follow-up in 2 weeks. Plan Follow-up Appointments: Return Appointment in 2 weeks. - w/ Dr. Heber [place] Anesthetic: (In clinic) Topical Lidocaine 5% applied to wound bed Bathing/ Shower/ Hygiene: May shower with protection but do  not get wound dressing(s) wet. Off-Loading: Turn and reposition every 2 hours The following medication(s) was prescribed: lidocaine topical 5 % gel gel topical once daily for PRN debridement/pain was prescribed at facility WOUND #1: - Sacrum Wound Laterality: Cleanser: Vashe 5.8 (oz) Discharge Instructions: Cleanse the wound with Vashe prior to applying a clean dressing using gauze sponges, not tissue or cotton balls. Prim Dressing: Hydrofera Blue Ready Foam, 4x5 in ary Discharge Instructions: Apply to wound bed as instructed Prim  Dressing: MediHoney Gel, tube 1.5 (oz) ary Discharge Instructions: Apply to wound bed as instructed Secondary Dressing: ABD Pad, 5x9 Discharge Instructions: Apply over primary dressing as directed. Secured With: 10M Medipore H Soft Cloth Surgical T ape, 4 x 10 (in/yd) Discharge Instructions: Secure with tape as directed. Mark, Reyes ([id number]) [id number] Page 6 of 8 1. Medihoney with Hydrofera Blue 2. Aggressive offloading 3. Follow-up in 2 weeks Electronic Signature(s) Signed: 12/30/2021 11:34:26 AM By: Kalman Shan DO Entered By: Kalman Shan on 12/30/2021 11:00:28 -------------------------------------------------------------------------------- HxROS Details Patient Name: Date of Service: [name], [name] 12/30/2021 9:45 A M Medical Record Number: [id number] Patient Account Number: [id number] Date of Birth/Sex: Treating RN: [birth year] (61 y.o. E[place] Noe Primary Care Provider: [name] Other Clinician: Referring Provider: Treating Provider/Extender: Ulla Potash in Treatment: 0 Information Obtained From Patient Chart Constitutional Symptoms (General Health) Complaints and Symptoms: Negative for: Fatigue; Fever; Chills; Marked Weight Change Eyes Complaints and Symptoms: Positive for: Glasses / Contacts - Does not have them Negative for: Dry Eyes; Vision Changes Ear/Nose/Mouth/Throat Complaints and Symptoms: Negative for: Chronic sinus problems or rhinitis Respiratory Complaints and Symptoms: Positive for: Shortness of Breath Negative for: Chronic or frequent coughs Medical History: Negative for: Aspiration; Asthma; Chronic Obstructive Pulmonary Disease (COPD); Pneumothorax; Sleep Apnea; Tuberculosis Cardiovascular Complaints and Symptoms: Positive for: Chest pain - hx Medical History: Positive for: Coronary Artery Disease; Hypertension; Myocardial Infarction - NSTEMI Negative for: Angina;  Arrhythmia; Congestive Heart Failure; Deep Vein Thrombosis; Hypotension; Peripheral Arterial Disease; Peripheral Venous Disease; Phlebitis; Vasculitis Past Medical History Notes: hypercholesterolemia Gastrointestinal Complaints and Symptoms: Negative for: Frequent diarrhea; Nausea; Vomiting Medical History: Negative for: Cirrhosis ; Colitis; Crohns; Hepatitis A; Hepatitis B; Hepatitis C Past Medical History Notes: gerd Integumentary (Skin) Mark, Reyes ([id number]) [id number] Page 7 of 8 Complaints and Symptoms: Positive for: Wounds - sacral Medical History: Negative for: History of Burn Musculoskeletal Complaints and Symptoms: Negative for: Muscle Pain; Muscle Weakness Medical History: Negative for: Gout; Rheumatoid Arthritis; Osteoarthritis; Osteomyelitis Psychiatric Complaints and Symptoms: Negative for: Claustrophobia Hematologic/Lymphatic Medical History: Negative for: Anemia; Hemophilia; Human Immunodeficiency Virus; Lymphedema; Sickle Cell Disease Endocrine Medical History: Positive for: Type II Diabetes Negative for: Type I Diabetes Time with diabetes: 4 years Treated with: Oral agents Blood sugar tested every day: Yes Tested : twice a day Genitourinary Medical History: Positive for: End Stage Renal Disease - dialysis Past Medical History Notes: aki; Immunological Medical History: Negative for: Lupus Erythematosus; Raynauds; Scleroderma Neurologic Medical History: Negative for: Dementia; Neuropathy; Quadriplegia; Paraplegia; Seizure Disorder Past Medical History Notes: hx stroke Oncologic Medical History: Negative for: Received Chemotherapy; Received Radiation Immunizations Pneumococcal Vaccine: Received Pneumococcal Vaccination: Yes Received Pneumococcal Vaccination On or After 60th Birthday: Yes Implantable Devices Yes Hospitalization / Surgery History Type of Hospitalization/Surgery g-tube percutaneous  trach cardioversion/TEE CV line left IR fluoro mediastinal exploration left heart cath and coronary angiography coronary arter bypass graft cannulation for ecmo Family and Social History Mark, Reyes ([id number]) [id number] Page 8 of 8 Cancer: Yes -  Mother; Diabetes: Yes - Mother; Heart Disease: No; Hereditary Spherocytosis: No; Hypertension: No; Kidney Disease: No; Lung Disease: No; Seizures: No; Stroke: No; Thyroid Problems: No; Tuberculosis: No; Former smoker; Marital Status - Married; Alcohol Use: Never; Drug Use: No History; Caffeine Use: Rarely; Financial Concerns: No; Food, Clothing or Shelter Needs: No; Support System Lacking: No; Transportation Concerns: No Electronic Signature(s) Signed: 12/30/2021 11:34:26 AM By: Kalman Shan DO Signed: 12/30/2021 4:39:11 PM By: Erenest Blank Signed: 12/30/2021 4:45:06 PM By: Rhae Hammock RN Entered By: Erenest Blank on 12/30/2021 09:47:24 -------------------------------------------------------------------------------- SuperBill Details Patient Name: Date of Service: [name], [name] 12/30/2021 Medical Record Number: [id number] Patient Account Number: [id number] Date of Birth/Sex: Treating RN: 1961/01/17 (61 y.o. M) Primary Care Provider: [name] Other Clinician: Referring Provider: Treating Provider/Extender: Vista Mink Weeks in Treatment: 0 Diagnosis Coding ICD-10 Codes Code Description [phone] Pressure ulcer of sacral region, stage 3 N18.6 End stage renal disease Z95.1 Presence of aortocoronary bypass graft E11.622 Type 2 diabetes mellitus with other skin ulcer Facility Procedures : CPT4 Code: [id number] Description: 9213 - WOUND CARE VISIT-LEV 3 EST PT Modifier: Quantity: 1 Physician Procedures : CPT4 Code Description Modifier [id number] - WC PHYS LEVEL 4 - NEW PT ICD-10 Diagnosis Description L89.153 Pressure ulcer of sacral region, stage 3 N18.6 End stage  renal disease Z95.1 Presence of aortocoronary bypass graft E11.622 Type 2 diabetes  mellitus with other skin ulcer Quantity: 1 Electronic Signature(s) Signed: 12/30/2021 1:38:49 PM By: Kalman Shan DO Signed: 12/30/2021 4:45:06 PM By: Rhae Hammock RN Previous Signature: 12/30/2021 11:34:26 AM Version By: Kalman Shan DO Entered By: Rhae Hammock on 12/30/2021 12:15:00

## 2021-12-30 NOTE — Progress Notes (Signed)
[name], [name] ([id number]) [id number] Page 1 of 4 Visit Report for 12/30/2021 Abuse Risk Screen Details Patient Name: Date of Service: [name], Mark UL M. 12/30/2021 9:45 A M Medical Record Number: [id number] Patient Account Number: [id number] Date of Birth/Sex: Treating RN: 03-Nov-1960 (61 y.o. M) Primary Care [name]: [name] Other Clinician: Referring [name]: Treating [name]/Extender: [name]: 0 Abuse Risk Screen Items Answer ABUSE RISK SCREEN: Has anyone close to you tried to hurt or harm you recentlyo No Do you feel uncomfortable with anyone in your familyo No Has anyone forced you do things that you didnt want to doo No Electronic Signature(s) Signed: 12/30/2021 4:39:11 PM By: [name] Entered By: [name] on 12/30/2021 09:47:40 -------------------------------------------------------------------------------- Activities of Daily Living Details Patient Name: Date of Service: [name], Mark UL M. 12/30/2021 9:45 A M Medical Record Number: [id number] Patient Account Number: [id number] Date of Birth/Sex: Treating RN: 06/09/1960 (61 y.o. M) Primary Care [name]: [name] Other Clinician: Referring [name]: Treating [name]: [name]: 0 Activities of Daily Living Items Answer Activities of Daily Living (Please select one for each item) Drive Automobile Not Able T Medications ake Need Assistance Use T elephone Completely Able Care for Appearance Completely Able Use T oilet Completely Able Bath / Shower Completely Able Dress Self Completely Able Feed Self Completely Able Walk Completely Able Get In / Out Bed Completely Able Housework Need Assistance Prepare Meals Not Able Handle Money Completely Able Shop for Self Not Able Electronic Signature(s) Signed: 12/30/2021 4:39:11 PM By: [name] Entered By: [name] on 12/30/2021 09:51:04 Rudean Curt ([id number]) [id number] Page 2 of 4 -------------------------------------------------------------------------------- Education Screening Details Patient Name: Date of Service: [name], Mark UL M. 12/30/2021 9:45 A M Medical Record Number: [id number] Patient Account Number: [id number] Date of Birth/Sex: Treating RN: [birth year] (61 y.o. M) Primary Care [name]: [name] Other Clinician: Referring [name]: Treating [name]/Extender: [name]: 0 Primary Learner Assessed: Patient Learning Preferences/Education Level/Primary Language Learning Preference: Explanation, Demonstration, Printed Material Highest Education Level: College or Above Preferred Language: English Cognitive Barrier Language Barrier: No Translator Needed: No Memory Deficit: No Emotional Barrier: No Cultural/Religious Beliefs Affecting Medical Care: No Physical Barrier Impaired Vision: Yes Glasses Impaired Hearing: No Decreased Hand dexterity: No Knowledge/Comprehension Knowledge Level: High Comprehension Level: High Ability to understand written instructions: High Ability to understand verbal instructions: High Motivation Anxiety Level: Calm Cooperation: Cooperative Education Importance: Acknowledges Need Interest in Health Problems: Asks Questions Perception: Coherent Willingness to Engage in Self-Management High Activities: Readiness to Engage in Self-Management High Activities: Electronic Signature(s) Signed: 12/30/2021 4:39:11 PM By: [name] Entered By: [name] on 12/30/2021 09:52:06 -------------------------------------------------------------------------------- Fall Risk Assessment Details Patient Name: Date of Service: [name], Mark UL M. 12/30/2021 9:45 A M Medical Record Number: [id number] Patient Account Number: [id number] Date of Birth/Sex: Treating  RN: [birth year] (61 y.o. M) Primary Care [name]: [name] Other Clinician: Referring [name]: Treating [name]/Extender: [name]: 0 Fall Risk Assessment Items Have you had 2 or more falls in the last [address] monthso 0 No [name], [name] ([id number]) [id number] Page 3 of 4 Have you had any fall that resulted in injury in the last 12 monthso 0 No FALLS RISK SCREEN History of falling - immediate or within 3 months 0 No Secondary diagnosis (Do you have 2 or more medical diagnoseso) 0 No Ambulatory aid None/bed rest/wheelchair/nurse 0 Yes Crutches/cane/walker  0 No Furniture 0 No Intravenous therapy Access/Saline/Heparin Lock 0 No Gait/Transferring Normal/ bed rest/ wheelchair 0 Yes Weak (short steps with or without shuffle, stooped but able to lift head while walking, may seek 0 No support from furniture) Impaired (short steps with shuffle, may have difficulty arising from chair, head down, impaired 0 No balance) Mental Status Oriented to own ability 0 Yes Electronic Signature(s) Signed: 12/30/2021 4:39:11 PM By: [name] Entered By: [name] on 12/30/2021 09:52:29 -------------------------------------------------------------------------------- Foot Assessment Details Patient Name: Date of Service: [name], Mark UL M. 12/30/2021 9:45 A M Medical Record Number: [id number] Patient Account Number: [id number] Date of Birth/Sex: Treating RN: 06-15-1960 (61 y.o. M) Primary Care Nahjae Hoeg: [name] Other Clinician: Referring [name]: Treating [name]/Extender: [name]: 0 Foot Assessment Items Site Locations + = Sensation present, - = Sensation absent, C = Callus, U = Ulcer R = Redness, W = Warmth, M = Maceration, PU = Pre-ulcerative lesion F = Fissure, S = Swelling, D = Dryness Assessment Right: Left: Other Deformity: No No Prior Foot Ulcer: No  No Prior Amputation: No No Charcot Joint: No No Ambulatory Status: Ambulatory Without Help GaitCAMREN, [name] ([id number]) [phone] [id number] Page 4 of 4 Notes Sacral wound Electronic Signature(s) Signed: 12/30/2021 4:39:11 PM By: [name] Entered By: [name] on 12/30/2021 09:53:27 -------------------------------------------------------------------------------- Nutrition Risk Screening Details Patient Name: Date of Service: [name], Mark UL M. 12/30/2021 9:45 A M Medical Record Number: [id number] Patient Account Number: [id number] Date of Birth/Sex: Treating RN: 05-15-1960 (61 y.o. M) Primary Care [name]: [name] Other Clinician: Referring [name]: Treating Zeyad Delaguila/Extender: [name]: 0 Height (in): 67 Weight (lbs): 180 Body Mass Index (BMI): 28.2 Nutrition Risk Screening Items Score Screening NUTRITION RISK SCREEN: I have an illness or condition that made me change the kind and/or amount of food I eat 0 No I eat fewer than two meals per day 0 No I eat few fruits and vegetables, or milk products 2 Yes I have three or more drinks of beer, liquor or wine almost every day 0 No I have tooth or mouth problems that make it hard for me to eat 0 No I don't always have enough money to buy the food I need 0 No I eat alone most of the time 0 No I take three or more different prescribed or over-the-counter drugs a day 1 Yes Without wanting to, I have lost or gained 10 pounds in the last six months 2 Yes I am not always physically able to shop, cook and/or feed myself 0 No Nutrition Protocols Good Risk Protocol Moderate Risk Protocol 0 Provide education on nutrition High Risk Proctocol Risk Level: Moderate Risk Score: 5 Electronic Signature(s) Signed: 12/30/2021 4:39:11 PM By: [name] Entered By: [name] on 12/30/2021 09:52:58

## 2022-01-01 ENCOUNTER — Other Ambulatory Visit (HOSPITAL_COMMUNITY): Payer: Self-pay

## 2022-01-01 ENCOUNTER — Encounter (HOSPITAL_COMMUNITY): Payer: Self-pay

## 2022-01-01 NOTE — Progress Notes (Incomplete)
Advanced Heart Failure Clinic Note   Referring Physician: PCP: Mark Mai, MD PCP-Cardiologist: Mark Bergeron, MD  AHF: Dr. Haroldine Reyes   Reason for Visit: [facility] Follow-up   HPI:       Review of Systems: [y] = yes, [ ]  = no   General: Weight gain [ ] ; Weight loss [ ] ; Anorexia [ ] ; Fatigue [ ] ; Fever [ ] ; Chills [ ] ; Weakness [ ]   Cardiac: Chest pain/pressure [ ] ; Resting SOB [ ] ; Exertional SOB [ ] ; Orthopnea [ ] ; Pedal Edema [ ] ; Palpitations [ ] ; Syncope [ ] ; Presyncope [ ] ; Paroxysmal nocturnal dyspnea[ ]   Pulmonary: Cough [ ] ; Wheezing[ ] ; Hemoptysis[ ] ; Sputum [ ] ; Snoring [ ]   GI: Vomiting[ ] ; Dysphagia[ ] ; Melena[ ] ; Hematochezia [ ] ; Heartburn[ ] ; Abdominal pain [ ] ; Constipation [ ] ; Diarrhea [ ] ; BRBPR [ ]   GU: Hematuria[ ] ; Dysuria [ ] ; Nocturia[ ]   Vascular: Pain in legs with walking [ ] ; Pain in feet with lying flat [ ] ; Non-healing sores [ ] ; Stroke [ ] ; TIA [ ] ; Slurred speech [ ] ;  Neuro: Headaches[ ] ; Vertigo[ ] ; Seizures[ ] ; Paresthesias[ ] ;Blurred vision [ ] ; Diplopia [ ] ; Vision changes [ ]   Ortho/Skin: Arthritis [ ] ; Joint pain [ ] ; Muscle pain [ ] ; Joint swelling [ ] ; Back Pain [ ] ; Rash [ ]   Psych: Depression[ ] ; Anxiety[ ]   Heme: Bleeding problems [ ] ; Clotting disorders [ ] ; Anemia [ ]   Endocrine: Diabetes [ ] ; Thyroid dysfunction[ ]    Past Medical History:  Diagnosis Date   AKI (acute kidney injury) ([place])    pt unaware of this   Anginal pain (HCC)    CAD (coronary artery disease)    a. 03/2015 NSTEMI: LHC with severe 3V CAD  (70% mid RCA, 95% OM1, 90% distal LCx, 90% OM3, 80% prox LAD and 90% ost D1) s/p DES to mLAD w/ small dissction Rx with DES, staged ost Ramus PCI/DES and dLCx s/p PCI/DES    Chest pain 12/24/2020   Diabetes mellitus type 2 in obese [facility])    Diverticulosis    Dyspnea    Dyspnea on exertion 03/16/2015   Dyspnea on exertion   Family history of adverse reaction to anesthesia    patient father- pt states after  anesthesia his father "developed dementia"   GERD (gastroesophageal reflux disease)    Hypercholesteremia    Hypertension associated with diabetes ([place]) 03/16/2015   hypertension   NSTEMI (non-ST elevated myocardial infarction) ([place]) 03/17/2015   Obesity    Stroke ([place]) 2022   pt states he had a "mini stroke" during cardiac catheterization   Tobacco abuse     Current Outpatient Medications  Medication Sig Dispense Refill   acetaminophen (TYLENOL) 325 MG tablet Take 2 tablets (650 mg total) by mouth every 6 (six) hours as needed for mild pain, headache or fever.     ascorbic acid (VITAMIN C) 500 MG tablet Take 500 mg by mouth daily.     aspirin 81 MG chewable tablet Chew 1 tablet (81 mg total) by mouth daily.     atorvastatin (LIPITOR) 80 MG tablet Take 1 tablet (80 mg total) by mouth daily. 30 tablet 2   Continuous Blood Gluc Sensor (FREESTYLE LIBRE 2 SENSOR) MISC Utilize as directed q 14 days to monitor blood sugar. 2 each 1   folic acid (FOLVITE) 1 MG tablet Take 1 tablet (1 mg total) by mouth daily.     glucose blood (TRUE METRIX  BLOOD GLUCOSE TEST) test strip Use to check blood sugar three times daily. 100 each 2   insulin aspart (NOVOLOG) 100 UNIT/ML injection Inject 0-6 Units into the skin every 4 (four) hours. CBG 70-150 ( 0 units) 151-200 (1 unit) 201-250 (2 units) 251-300 ( 3 units) 301-350 ( 4 units) 351-400 (5 units) >400 ( 6 units) 10 mL 11   Insulin Pen Needle (TRUEPLUS 5-BEVEL PEN NEEDLES) 32G X 4 MM MISC Use to inject Basaglar once daily. 100 each 3   leptospermum manuka honey (MEDIHONEY) PSTE paste Apply 1 Application topically daily. Apply Medihoney to sacrum/bilat buttock wound Q day, then cover with gauze and abd pad and tape  Apply thin layer (3 mm) to wound.     midodrine (PROAMATINE) 10 MG tablet Take 1 tablet (10 mg total) by mouth 3 (three) times daily with meals.     multivitamin (RENA-VIT) TABS tablet Take 1 tablet by mouth at bedtime.  0   Nutritional  Supplements (FEEDING SUPPLEMENT, OSMOLITE 1.5 CAL,) LIQD Place 237 mLs into feeding tube 4 (four) times daily. Bolus a full carton of Osmolite 1.5 (237 ml) formula via GRAVITY per PEG using a 50-60 cc syringe. Flush PEG with 20 ml before and after each bolus.  Pt is willing to take bolus tube feeds. He would prefer not to be asked each time whether he wants the bolus feed but instead would like it to be administered at the scheduled time.  0   pantoprazole (PROTONIX) 40 MG tablet Take 1 tablet (40 mg total) by mouth 2 (two) times daily.     polyvinyl alcohol (LIQUIFILM TEARS) 1.4 % ophthalmic solution Place 2 drops into both eyes as needed for dry eyes. 15 mL 0   QUEtiapine (SEROQUEL) 50 MG tablet Take 1 tablet (50 mg total) by mouth at bedtime. 30 tablet 0   thiamine (VITAMIN B-1) 100 MG tablet Take 1 tablet (100 mg total) by mouth daily.     TRUEplus Lancets 28G MISC Use to check blood sugar three times daily. 100 each 2   zinc gluconate 50 MG tablet Take 50 mg by mouth daily.     No current facility-administered medications for this visit.    No Active Allergies    Social History   Socioeconomic History   Marital status: Legally Separated    Spouse name: Not on file   Number of children: 0   Years of education: Not on file   Highest education level: Not on file  Occupational History   Not on file  Tobacco Use   Smoking status: Former    Packs/day: 0.50    Types: Cigarettes    Quit date: 2017    Years since quitting: 6.8   Smokeless tobacco: Never  Vaping Use   Vaping Use: Some days   Start date: 10/14/2017   Last attempt to quit: 09/09/2021  Substance and Sexual Activity   Alcohol use: Yes    Comment: very occasional, maybe a beer or mixed drink once every few months   Drug use: Not Currently   Sexual activity: Not on file  Other Topics Concern   Not on file  Social History Narrative   Not on file   Social Determinants of Health   Financial Resource Strain: Low Risk   (12/06/2021)   Overall Financial Resource Strain (CARDIA)    Difficulty of Paying Living Expenses: Not hard at all  Food Insecurity: No Food Insecurity (12/06/2021)   Hunger Vital Sign  Worried About Charity fundraiser in the Last Year: Never true    Page in the Last Year: Never true  Transportation Needs: No Transportation Needs (12/06/2021)   PRAPARE - Hydrologist (Medical): No    Lack of Transportation (Non-Medical): No  Physical Activity: Not on file  Stress: Not on file  Social Connections: Not on file  Intimate Partner Violence: Not At Risk (12/06/2021)   Humiliation, Afraid, Rape, and Kick questionnaire    Fear of Current or Ex-Partner: No    Emotionally Abused: No    Physically Abused: No    Sexually Abused: No     No family history on file.  There were no vitals filed for this visit.   PHYSICAL EXAM: General:  Well appearing. No respiratory difficulty HEENT: normal Neck: supple. no JVD. Carotids 2+ bilat; no bruits. No lymphadenopathy or thyromegaly appreciated. Cor: PMI nondisplaced. Regular rate & rhythm. No rubs, gallops or murmurs. Lungs: clear Abdomen: soft, nontender, nondistended. No hepatosplenomegaly. No bruits or masses. Good bowel sounds. Extremities: no cyanosis, clubbing, rash, edema Neuro: alert & oriented x 3, cranial nerves grossly intact. moves all 4 extremities w/o difficulty. Affect pleasant.  ECG:   ASSESSMENT & PLAN:     Mark Jester, PA-C 01/01/22

## 2022-01-08 ENCOUNTER — Encounter (HOSPITAL_COMMUNITY): Payer: Self-pay

## 2022-01-08 ENCOUNTER — Other Ambulatory Visit (HOSPITAL_COMMUNITY): Payer: Self-pay

## 2022-01-13 ENCOUNTER — Ambulatory Visit (HOSPITAL_BASED_OUTPATIENT_CLINIC_OR_DEPARTMENT_OTHER): Payer: Self-pay | Admitting: Internal Medicine

## 2022-01-20 ENCOUNTER — Other Ambulatory Visit: Payer: Self-pay

## 2022-01-20 ENCOUNTER — Ambulatory Visit (HOSPITAL_COMMUNITY)
Admission: RE | Admit: 2022-01-20 | Discharge: 2022-01-20 | Disposition: A | Discharge status: Home / Self Care | Payer: Self-pay | Source: Ambulatory Visit | Attending: Family Medicine | Admitting: Family Medicine

## 2022-01-20 DIAGNOSIS — Z431 Encounter for attention to gastrostomy: Secondary | ICD-10-CM | POA: Insufficient documentation

## 2022-01-20 DIAGNOSIS — R1319 Other dysphagia: Secondary | ICD-10-CM

## 2022-01-20 HISTORY — PX: IR GASTROSTOMY TUBE REMOVAL: IMG5492

## 2022-01-20 NOTE — Procedures (Signed)
Pt's 18 fr balloon retention gastrostomy tube was removed in it's entirety without immediate complications. Gauze dressing applied to site. EBL none.

## 2022-01-22 ENCOUNTER — Ambulatory Visit: Payer: Self-pay | Admitting: Surgery

## 2022-01-22 ENCOUNTER — Encounter (HOSPITAL_COMMUNITY): Payer: Self-pay

## 2022-01-24 ENCOUNTER — Encounter (HOSPITAL_COMMUNITY): Payer: Self-pay

## 2022-01-24 ENCOUNTER — Telehealth (HOSPITAL_COMMUNITY): Payer: Self-pay | Admitting: Family Medicine

## 2022-02-07 ENCOUNTER — Other Ambulatory Visit: Payer: Self-pay | Admitting: *Deleted

## 2022-02-07 DIAGNOSIS — N186 End stage renal disease: Secondary | ICD-10-CM

## 2022-02-12 ENCOUNTER — Encounter: Payer: Self-pay | Admitting: Vascular Surgery

## 2022-02-12 ENCOUNTER — Ambulatory Visit (INDEPENDENT_AMBULATORY_CARE_PROVIDER_SITE_OTHER)
Admission: RE | Admit: 2022-02-12 | Discharge: 2022-02-12 | Disposition: A | Discharge status: Home / Self Care | Payer: Self-pay | Source: Ambulatory Visit | Attending: Vascular Surgery | Admitting: Vascular Surgery

## 2022-02-12 ENCOUNTER — Ambulatory Visit (HOSPITAL_COMMUNITY)
Admission: RE | Admit: 2022-02-12 | Discharge: 2022-02-12 | Disposition: A | Discharge status: Home / Self Care | Payer: Self-pay | Source: Ambulatory Visit | Attending: Vascular Surgery | Admitting: Vascular Surgery

## 2022-02-12 ENCOUNTER — Ambulatory Visit (INDEPENDENT_AMBULATORY_CARE_PROVIDER_SITE_OTHER): Payer: Self-pay | Admitting: Vascular Surgery

## 2022-02-12 VITALS — BP 168/89 | HR 90 | Temp 98.2°F | Resp 20 | Ht 67.0 in | Wt 185.0 lb

## 2022-02-12 DIAGNOSIS — N186 End stage renal disease: Secondary | ICD-10-CM

## 2022-02-12 NOTE — H&P (View-Only) (Signed)
Patient ID: [name], male   DOB: 04-Dec-1960, 62 y.o.   MRN: [id number]  Reason for Consult: New Patient (Initial Visit)   Referred by Rexene Agent, MD  Subjective:     HPI:  [name] is a 62 y.o. male with now history of end-stage renal disease on dialysis via left IJ tunneled catheter.  He also has history of coronary artery bypass grafting in August of this year.  He was right-hand-dominant but has had dysfunction of his right arm since his surgery and now is mostly using his left hand and only wants to consider access on the right.  He does not take any blood thinners.  Past Medical History:  Diagnosis Date   AKI (acute kidney injury) ([place])    pt unaware of this   Anginal pain ([place])    CAD (coronary artery disease)    a. 03/2015 NSTEMI: LHC with severe 3V CAD  (70% mid RCA, 95% OM1, 90% distal LCx, 90% OM3, 80% prox LAD and 90% ost D1) s/p DES to mLAD w/ small dissction Rx with DES, staged ost Ramus PCI/DES and dLCx s/p PCI/DES    Chest pain 12/24/2020   Diabetes mellitus type 2 in obese [facility])    Diverticulosis    Dyspnea    Dyspnea on exertion 03/16/2015   Dyspnea on exertion   Family history of adverse reaction to anesthesia    patient father- pt states after anesthesia his father "developed dementia"   GERD (gastroesophageal reflux disease)    Hypercholesteremia    Hypertension associated with diabetes ([place]) 03/16/2015   hypertension   NSTEMI (non-ST elevated myocardial infarction) ([place]) 03/17/2015   Obesity    Stroke ([place]) 2022   pt states he had a "mini stroke" during cardiac catheterization   Tobacco abuse    History reviewed. No pertinent family history. Past Surgical History:  Procedure Laterality Date   APPLICATION OF WOUND VAC N/A 10/03/2021   Procedure: APPLICATION OF WOUND VAC;  Surgeon: Dahlia Byes, MD;  Location: [place];  Service: Thoracic;  Laterality: N/A;   CANNULATION FOR ECMO (EXTRACORPOREAL MEMBRANE OXYGENATION) N/A 09/29/2021    Procedure: CANNULATION FOR ECMO (EXTRACORPOREAL MEMBRANE OXYGENATION);  Surgeon: Lajuana Matte, MD;  Location: [place];  Service: Open Heart Surgery;  Laterality: N/A;   CARDIAC CATHETERIZATION N/A 03/19/2015   Procedure: Left Heart Cath and Coronary Angiography;  Surgeon: Lorretta Harp, MD;  Location: [place] [facility];  Service: Cardiovascular;  Laterality: N/A;   CARDIAC CATHETERIZATION N/A 03/20/2015   Procedure: Coronary Stent Intervention;  Surgeon: Lorretta Harp, MD;  Location: [place] [facility];  Service: Cardiovascular;  Laterality: N/A;   CARDIAC CATHETERIZATION N/A 03/22/2015   Procedure: Coronary Stent Intervention;  Surgeon: Lorretta Harp, MD;  Location: [place] [facility];  Service: Cardiovascular;  Laterality: N/A;   CORONARY ARTERY BYPASS GRAFT N/A 09/12/2021   Procedure: CORONARY ARTERY BYPASS GRAFTING (CABG) X 5 USING LEFT INTERNAL MAMMARY ARTERY AND ENDOSCOPICALLY HARVESTED RIGHT GREATER SAPHENOUS VEIN.;  Surgeon: [name], MD;  Location: [place];  Service: Open Heart Surgery;  Laterality: N/A;   EXPLORATION POST OPERATIVE OPEN HEART N/A 09/30/2021   Procedure: EXPLORATION POST OPERATIVE OPEN HEART WASHOUT;  Surgeon: Lajuana Matte, MD;  Location: [place];  Service: Open Heart Surgery;  Laterality: N/A;   IR FLUORO GUIDE CV LINE LEFT  11/04/2021   IR FLUORO GUIDE CV LINE LEFT  12/06/2021   IR GASTROSTOMY TUBE MOD SED  11/15/2021   IR GASTROSTOMY TUBE MOD SED  11/20/2021   IR GASTROSTOMY TUBE REMOVAL  01/20/2022   IR US GUIDE VASC ACCESS LEFT  11/04/2021   IR VENOCAVAGRAM SVC  12/06/2021   LEFT HEART CATH AND CORONARY ANGIOGRAPHY N/A 12/25/2020   Procedure: LEFT HEART CATH AND CORONARY ANGIOGRAPHY;  Surgeon: Troy Sine, MD;  Location: [place] [facility];  Service: Cardiovascular;  Laterality: N/A;   LEFT HEART CATH AND CORONARY ANGIOGRAPHY N/A 12/26/2020   Procedure: LEFT HEART CATH AND CORONARY ANGIOGRAPHY;  Surgeon: Troy Sine, MD;  Location: [place] [facility];  Service: Cardiovascular;  Laterality: N/A;   MEDIASTINAL EXPLORATION N/A 10/03/2021   Procedure: MEDIASTINAL WASHOUT;  Surgeon: Dahlia Byes, MD;  Location: [place];  Service: Thoracic;  Laterality: N/A;  PUMP STANDBY   MEDIASTINAL EXPLORATION N/A 10/12/2021   Procedure: MEDIASTINAL WASHOUT;  Surgeon: [name], MD;  Location: [place];  Service: Thoracic;  Laterality: N/A;   PERCUTANEOUS TRACHEOSTOMY N/A 10/17/2021   Procedure: PERCUTANEOUS TRACHEOSTOMY USING SHILEY FLEXIBLE 8 mm CUFFED [place].;  Surgeon: Candee Furbish, MD;  Location: [place];  Service: Pulmonary;  Laterality: N/A;  Percutaneous tracheostomy   PERICARDIAL WINDOW Right 09/28/2021   Procedure: PERICARDIAL WINDOW;  Surgeon: Lajuana Matte, MD;  Location: [place];  Service: Thoracic;  Laterality: Right;  Right VATS.  Lazy lateral.  double lumen ET tube   PLACEMENT OF IMPELLA LEFT VENTRICULAR ASSIST DEVICE N/A 10/07/2021   Procedure: INSERTION OF RIGHT AXILLARY IMPELLA, DECANNULATION OF ECMO, AND MEDIASTINAL WASHOUT;  Surgeon: [name], MD;  Location: [place];  Service: Open Heart Surgery;  Laterality: N/A;  Open right axillary with 8 mm Hemashield Platinum Vascular graft.   RADIOLOGY WITH ANESTHESIA N/A 11/20/2021   Procedure: G-Tube Placement;  Surgeon: Sandi Mariscal, MD;  Location: [place];  Service: Radiology;  Laterality: N/A;   REMOVAL OF IMPELLA LEFT VENTRICULAR ASSIST DEVICE Right 10/17/2021   Procedure: REMOVAL OF IMPELLA LEFT VENTRICULAR ASSIST DEVICE;  Surgeon: [name], MD;  Location: [place];  Service: Open Heart Surgery;  Laterality: Right;   STERNAL CLOSURE N/A 10/12/2021   Procedure: STERNAL CLOSURE;  Surgeon: [name], MD;  Location: [facility];  Service: Thoracic;  Laterality: N/A;   TEE WITHOUT CARDIOVERSION N/A 09/12/2021   Procedure: TRANSESOPHAGEAL ECHOCARDIOGRAM (TEE);  Surgeon: [name], MD;  Location: [place];  Service: Open Heart Surgery;  Laterality: N/A;   TEE WITHOUT CARDIOVERSION  N/A 09/29/2021   Procedure: TRANSESOPHAGEAL ECHOCARDIOGRAM (TEE);  Surgeon: Lajuana Matte, MD;  Location: [place];  Service: Open Heart Surgery;  Laterality: N/A;   TEE WITHOUT CARDIOVERSION N/A 10/03/2021   Procedure: TRANSESOPHAGEAL ECHOCARDIOGRAM (TEE);  Surgeon: Dahlia Byes, MD;  Location: [place];  Service: Thoracic;  Laterality: N/A;   TEE WITHOUT CARDIOVERSION  10/07/2021   Procedure: TRANSESOPHAGEAL ECHOCARDIOGRAM (TEE);  Surgeon: [name], MD;  Location[facility] OR;  Service: Open Heart Surgery;;   TEE WITHOUT CARDIOVERSION N/A 10/12/2021   Procedure: TRANSESOPHAGEAL ECHOCARDIOGRAM (TEE);  Surgeon: [name], MD;  Location: [facility] OR;  Service: Thoracic;  Laterality: N/A;   TEE WITHOUT CARDIOVERSION N/A 10/17/2021   Procedure: TRANSESOPHAGEAL ECHOCARDIOGRAM (TEE);  Surgeon: [name], MD;  Location: [place];  Service: Open Heart Surgery;  Laterality: N/A;   TESTICLE SURGERY  1970   pt states testicle was ascended and had to be "pulled down"   TONSILLECTOMY     as a child    Short  Social History:  Social History   Tobacco Use   Smoking status: Former    Packs/day: 0.50    Types: Cigarettes    Quit date: 2017    Years since quitting: 7.0   Smokeless tobacco: Never  Substance Use Topics   Alcohol use: Yes    Comment: very occasional, maybe a beer or mixed drink once every few months    No Known Allergies  Current Outpatient Medications  Medication Sig Dispense Refill   acetaminophen (TYLENOL) 325 MG tablet Take 2 tablets (650 mg total) by mouth every 6 (six) hours as needed for mild pain, headache or fever.     ascorbic acid (VITAMIN C) 500 MG tablet Take 500 mg by mouth daily.     aspirin 81 MG chewable tablet Chew 1 tablet (81 mg total) by mouth daily.     atorvastatin (LIPITOR) 80 MG tablet Take 1 tablet (80 mg total) by mouth daily. 30 tablet 2   Continuous Blood Gluc Sensor (FREESTYLE LIBRE 2 SENSOR) MISC Utilize as directed q 14 days to monitor blood sugar.  2 each 1   folic acid (FOLVITE) 1 MG tablet Take 1 tablet (1 mg total) by mouth daily.     glucose blood (TRUE METRIX BLOOD GLUCOSE TEST) test strip Use to check blood sugar three times daily. 100 each 2   insulin aspart (NOVOLOG) 100 UNIT/ML injection Inject 0-6 Units into the skin every 4 (four) hours. CBG 70-150 ( 0 units) 151-200 (1 unit) 201-250 (2 units) 251-300 ( 3 units) 301-350 ( 4 units) 351-400 (5 units) >400 ( 6 units) 10 mL 11   Insulin Pen Needle (TRUEPLUS 5-BEVEL PEN NEEDLES) 32G X 4 MM MISC Use to inject Basaglar once daily. 100 each 3   leptospermum manuka honey (MEDIHONEY) PSTE paste Apply 1 Application topically daily. Apply Medihoney to sacrum/bilat buttock wound Q day, then cover with gauze and abd pad and tape  Apply thin layer (3 mm) to wound.     midodrine (PROAMATINE) 10 MG tablet Take 1 tablet (10 mg total) by mouth 3 (three) times daily with meals.     multivitamin (RENA-VIT) TABS tablet Take 1 tablet by mouth at bedtime.  0   Nutritional Supplements (FEEDING SUPPLEMENT, OSMOLITE 1.5 CAL,) LIQD Place 237 mLs into feeding tube 4 (four) times daily. Bolus a full carton of Osmolite 1.5 (237 ml) formula via GRAVITY per PEG using a 50-60 cc syringe. Flush PEG with 20 ml before and after each bolus.  Pt is willing to take bolus tube feeds. He would prefer not to be asked each time whether he wants the bolus feed but instead would like it to be administered at the scheduled time.  0   pantoprazole (PROTONIX) 40 MG tablet Take 1 tablet (40 mg total) by mouth 2 (two) times daily.     polyvinyl alcohol (LIQUIFILM TEARS) 1.4 % ophthalmic solution Place 2 drops into both eyes as needed for dry eyes. 15 mL 0   QUEtiapine (SEROQUEL) 50 MG tablet Take 1 tablet (50 mg total) by mouth at bedtime. 30 tablet 0   thiamine (VITAMIN B-1) 100 MG tablet Take 1 tablet (100 mg total) by mouth daily.     TRUEplus Lancets 28G MISC Use to check blood sugar three times daily. 100 each 2   zinc  gluconate 50 MG tablet Take 50 mg by mouth daily.     No current facility-administered medications for this visit.    Review of Systems  Constitutional:  Constitutional negative. HENT: HENT negative.  Eyes: Eyes negative.  Respiratory: Respiratory negative.  Cardiovascular: Cardiovascular negative.  GI: Gastrointestinal negative.  Musculoskeletal: Positive for gait problem.  Neurological: Positive for focal weakness.  Hematologic: Hematologic/lymphatic negative.  Psychiatric: Psychiatric negative.        Objective:  Objective   Vitals:   02/12/22 1413  BP: (!) 168/89  Pulse: 90  Resp: 20  Temp: 98.2 F (36.8 C)  SpO2: 93%  Weight: 185 lb (83.9 kg)  Height: 5\' 7"  (1.702 m)   Body mass index is 28.98 kg/m.  Physical Exam HENT:     Head: Normocephalic.     Nose: Nose normal.  Eyes:     Pupils: Pupils are equal, round, and reactive to light.  Cardiovascular:     Rate and Rhythm: Normal rate.     Comments: Strong right ulnar artery signal Neurological:     Mental Status: He is alert.     Data: Right Cephalic   Diameter (cm)Depth (cm)Findings   +-----------------+-------------+----------+---------+  Shoulder            0.09        0.86              +-----------------+-------------+----------+---------+  Prox upper arm       0.20        0.62              +-----------------+-------------+----------+---------+  Mid upper arm        0.17        0.61              +-----------------+-------------+----------+---------+  Dist upper arm       0.15        0.54              +-----------------+-------------+----------+---------+  Antecubital fossa    0.31        0.34              +-----------------+-------------+----------+---------+  Prox forearm         0.15        0.53              +-----------------+-------------+----------+---------+  Mid forearm          0.17        0.63               +-----------------+-------------+----------+---------+  Wrist               0.10        0.47   branching  +-----------------+-------------+----------+---------+   +-----------------+-------------+----------+--------------+  Right Basilic    Diameter (cm)Depth (cm)   Findings     +-----------------+-------------+----------+--------------+  Mid upper arm        0.36        1.74                   +-----------------+-------------+----------+--------------+  Dist upper arm       0.32        0.90                   +-----------------+-------------+----------+--------------+  Antecubital fossa    0.13        0.45                   +-----------------+-------------+----------+--------------+  Prox forearm  not visualized  +-----------------+-------------+----------+--------------+  Mid forearm                             not visualized  +-----------------+-------------+----------+--------------+  Distal forearm                          not visualized  +-----------------+-------------+----------+--------------+   *See table(s) above for measurements and observations.      Assessment/Plan:    62 year old male on dialysis via catheter previously right-hand-dominant but right hand now very weak since open heart surgery and plan is for right arm access.  We discussed the risk and benefits including steal and primary nonfunction and need for further procedures and he demonstrates good understanding.  Will get him scheduled for a right arm fistula versus graft on a nondialysis day in the near future.    Waynetta Sandy MD [facility]

## 2022-02-12 NOTE — Progress Notes (Signed)
Patient ID: [name], male   DOB: 04/15/1960, 62 y.o.   MRN: [id number]  Reason for Consult: New Patient (Initial Visit)   Referred by Rexene Agent, MD  Subjective:     HPI:  [name] is a 62 y.o. male with now history of end-stage renal disease on dialysis via left IJ tunneled catheter.  He also has history of coronary artery bypass grafting in August of this year.  He was right-hand-dominant but has had dysfunction of his right arm since his surgery and now is mostly using his left hand and only wants to consider access on the right.  He does not take any blood thinners.  Past Medical History:  Diagnosis Date   AKI (acute kidney injury) ([place])    pt unaware of this   Anginal pain ([place])    CAD (coronary artery disease)    a. 03/2015 NSTEMI: LHC with severe 3V CAD  (70% mid RCA, 95% OM1, 90% distal LCx, 90% OM3, 80% prox LAD and 90% ost D1) s/p DES to mLAD w/ small dissction Rx with DES, staged ost Ramus PCI/DES and dLCx s/p PCI/DES    Chest pain 12/24/2020   Diabetes mellitus type 2 in obese [facility])    Diverticulosis    Dyspnea    Dyspnea on exertion 03/16/2015   Dyspnea on exertion   Family history of adverse reaction to anesthesia    patient father- pt states after anesthesia his father "developed dementia"   GERD (gastroesophageal reflux disease)    Hypercholesteremia    Hypertension associated with diabetes ([place]) 03/16/2015   hypertension   NSTEMI (non-ST elevated myocardial infarction) ([place]) 03/17/2015   Obesity    Stroke ([place]) 2022   pt states he had a "mini stroke" during cardiac catheterization   Tobacco abuse    History reviewed. No pertinent family history. Past Surgical History:  Procedure Laterality Date   APPLICATION OF WOUND VAC N/A 10/03/2021   Procedure: APPLICATION OF WOUND VAC;  Surgeon: Dahlia Byes, MD;  Location: [place];  Service: Thoracic;  Laterality: N/A;   CANNULATION FOR ECMO (EXTRACORPOREAL MEMBRANE OXYGENATION) N/A 09/29/2021    Procedure: CANNULATION FOR ECMO (EXTRACORPOREAL MEMBRANE OXYGENATION);  Surgeon: Lajuana Matte, MD;  Location: [place];  Service: Open Heart Surgery;  Laterality: N/A;   CARDIAC CATHETERIZATION N/A 03/19/2015   Procedure: Left Heart Cath and Coronary Angiography;  Surgeon: Lorretta Harp, MD;  Location: [place] [facility];  Service: Cardiovascular;  Laterality: N/A;   CARDIAC CATHETERIZATION N/A 03/20/2015   Procedure: Coronary Stent Intervention;  Surgeon: Lorretta Harp, MD;  Location: [place] [facility];  Service: Cardiovascular;  Laterality: N/A;   CARDIAC CATHETERIZATION N/A 03/22/2015   Procedure: Coronary Stent Intervention;  Surgeon: Lorretta Harp, MD;  Location: [place] [facility];  Service: Cardiovascular;  Laterality: N/A;   CORONARY ARTERY BYPASS GRAFT N/A 09/12/2021   Procedure: CORONARY ARTERY BYPASS GRAFTING (CABG) X 5 USING LEFT INTERNAL MAMMARY ARTERY AND ENDOSCOPICALLY HARVESTED RIGHT GREATER SAPHENOUS VEIN.;  Surgeon: [name], MD;  Location: [place];  Service: Open Heart Surgery;  Laterality: N/A;   EXPLORATION POST OPERATIVE OPEN HEART N/A 09/30/2021   Procedure: EXPLORATION POST OPERATIVE OPEN HEART WASHOUT;  Surgeon: Lajuana Matte, MD;  Location: [place];  Service: Open Heart Surgery;  Laterality: N/A;   IR FLUORO GUIDE CV LINE LEFT  11/04/2021   IR FLUORO GUIDE CV LINE LEFT  12/06/2021   IR GASTROSTOMY TUBE MOD SED  11/15/2021   IR GASTROSTOMY TUBE MOD SED  11/20/2021   IR GASTROSTOMY TUBE REMOVAL  01/20/2022   IR US GUIDE VASC ACCESS LEFT  11/04/2021   IR VENOCAVAGRAM SVC  12/06/2021   LEFT HEART CATH AND CORONARY ANGIOGRAPHY N/A 12/25/2020   Procedure: LEFT HEART CATH AND CORONARY ANGIOGRAPHY;  Surgeon: Troy Sine, MD;  Location: [place] [facility];  Service: Cardiovascular;  Laterality: N/A;   LEFT HEART CATH AND CORONARY ANGIOGRAPHY N/A 12/26/2020   Procedure: LEFT HEART CATH AND CORONARY ANGIOGRAPHY;  Surgeon: Troy Sine, MD;  Location: [place] [facility];  Service: Cardiovascular;  Laterality: N/A;   MEDIASTINAL EXPLORATION N/A 10/03/2021   Procedure: MEDIASTINAL WASHOUT;  Surgeon: Dahlia Byes, MD;  Location: [place];  Service: Thoracic;  Laterality: N/A;  PUMP STANDBY   MEDIASTINAL EXPLORATION N/A 10/12/2021   Procedure: MEDIASTINAL WASHOUT;  Surgeon: [name], MD;  Location: [place];  Service: Thoracic;  Laterality: N/A;   PERCUTANEOUS TRACHEOSTOMY N/A 10/17/2021   Procedure: PERCUTANEOUS TRACHEOSTOMY USING SHILEY FLEXIBLE 8 mm CUFFED [place].;  Surgeon: Candee Furbish, MD;  Location: [place];  Service: Pulmonary;  Laterality: N/A;  Percutaneous tracheostomy   PERICARDIAL WINDOW Right 09/28/2021   Procedure: PERICARDIAL WINDOW;  Surgeon: Lajuana Matte, MD;  Location: [place];  Service: Thoracic;  Laterality: Right;  Right VATS.  Lazy lateral.  double lumen ET tube   PLACEMENT OF IMPELLA LEFT VENTRICULAR ASSIST DEVICE N/A 10/07/2021   Procedure: INSERTION OF RIGHT AXILLARY IMPELLA, DECANNULATION OF ECMO, AND MEDIASTINAL WASHOUT;  Surgeon: [name], MD;  Location: [place];  Service: Open Heart Surgery;  Laterality: N/A;  Open right axillary with 8 mm Hemashield Platinum Vascular graft.   RADIOLOGY WITH ANESTHESIA N/A 11/20/2021   Procedure: G-Tube Placement;  Surgeon: Sandi Mariscal, MD;  Location: [place];  Service: Radiology;  Laterality: N/A;   REMOVAL OF IMPELLA LEFT VENTRICULAR ASSIST DEVICE Right 10/17/2021   Procedure: REMOVAL OF IMPELLA LEFT VENTRICULAR ASSIST DEVICE;  Surgeon: [name], MD;  Location: [place];  Service: Open Heart Surgery;  Laterality: Right;   STERNAL CLOSURE N/A 10/12/2021   Procedure: STERNAL CLOSURE;  Surgeon: [name], MD;  [facility];  Service: Thoracic;  Laterality: N/A;   TEE WITHOUT CARDIOVERSION N/A 09/12/2021   Procedure: TRANSESOPHAGEAL ECHOCARDIOGRAM (TEE);  Surgeon: [name], MD;  Location: [place];  Service: Open Heart Surgery;  Laterality: N/A;   TEE WITHOUT CARDIOVERSION  N/A 09/29/2021   Procedure: TRANSESOPHAGEAL ECHOCARDIOGRAM (TEE);  Surgeon: Lajuana Matte, MD;  Location: [place];  Service: Open Heart Surgery;  Laterality: N/A;   TEE WITHOUT CARDIOVERSION N/A 10/03/2021   Procedure: TRANSESOPHAGEAL ECHOCARDIOGRAM (TEE);  Surgeon: Dahlia Byes, MD;  Location: [place];  Service: Thoracic;  Laterality: N/A;   TEE WITHOUT CARDIOVERSION  10/07/2021   Procedure: TRANSESOPHAGEAL ECHOCARDIOGRAM (TEE);  Surgeon: [name], MD;  Location: [facility] OR;  Service: Open Heart Surgery;;   TEE WITHOUT CARDIOVERSION N/A 10/12/2021   Procedure: TRANSESOPHAGEAL ECHOCARDIOGRAM (TEE);  Surgeon: [name], MD;  Location: [facility] OR;  Service: Thoracic;  Laterality: N/A;   TEE WITHOUT CARDIOVERSION N/A 10/17/2021   Procedure: TRANSESOPHAGEAL ECHOCARDIOGRAM (TEE);  Surgeon: [name], MD;  Location: [place];  Service: Open Heart Surgery;  Laterality: N/A;   TESTICLE SURGERY  1970   pt states testicle was ascended and had to be "pulled down"   TONSILLECTOMY     as a child    Short  Social History:  Social History   Tobacco Use   Smoking status: Former    Packs/day: 0.50    Types: Cigarettes    Quit date: 2017    Years since quitting: 7.0   Smokeless tobacco: Never  Substance Use Topics   Alcohol use: Yes    Comment: very occasional, maybe a beer or mixed drink once every few months    No Known Allergies  Current Outpatient Medications  Medication Sig Dispense Refill   acetaminophen (TYLENOL) 325 MG tablet Take 2 tablets (650 mg total) by mouth every 6 (six) hours as needed for mild pain, headache or fever.     ascorbic acid (VITAMIN C) 500 MG tablet Take 500 mg by mouth daily.     aspirin 81 MG chewable tablet Chew 1 tablet (81 mg total) by mouth daily.     atorvastatin (LIPITOR) 80 MG tablet Take 1 tablet (80 mg total) by mouth daily. 30 tablet 2   Continuous Blood Gluc Sensor (FREESTYLE LIBRE 2 SENSOR) MISC Utilize as directed q 14 days to monitor blood sugar.  2 each 1   folic acid (FOLVITE) 1 MG tablet Take 1 tablet (1 mg total) by mouth daily.     glucose blood (TRUE METRIX BLOOD GLUCOSE TEST) test strip Use to check blood sugar three times daily. 100 each 2   insulin aspart (NOVOLOG) 100 UNIT/ML injection Inject 0-6 Units into the skin every 4 (four) hours. CBG 70-150 ( 0 units) 151-200 (1 unit) 201-250 (2 units) 251-300 ( 3 units) 301-350 ( 4 units) 351-400 (5 units) >400 ( 6 units) 10 mL 11   Insulin Pen Needle (TRUEPLUS 5-BEVEL PEN NEEDLES) 32G X 4 MM MISC Use to inject Basaglar once daily. 100 each 3   leptospermum manuka honey (MEDIHONEY) PSTE paste Apply 1 Application topically daily. Apply Medihoney to sacrum/bilat buttock wound Q day, then cover with gauze and abd pad and tape  Apply thin layer (3 mm) to wound.     midodrine (PROAMATINE) 10 MG tablet Take 1 tablet (10 mg total) by mouth 3 (three) times daily with meals.     multivitamin (RENA-VIT) TABS tablet Take 1 tablet by mouth at bedtime.  0   Nutritional Supplements (FEEDING SUPPLEMENT, OSMOLITE 1.5 CAL,) LIQD Place 237 mLs into feeding tube 4 (four) times daily. Bolus a full carton of Osmolite 1.5 (237 ml) formula via GRAVITY per PEG using a 50-60 cc syringe. Flush PEG with 20 ml before and after each bolus.  Pt is willing to take bolus tube feeds. He would prefer not to be asked each time whether he wants the bolus feed but instead would like it to be administered at the scheduled time.  0   pantoprazole (PROTONIX) 40 MG tablet Take 1 tablet (40 mg total) by mouth 2 (two) times daily.     polyvinyl alcohol (LIQUIFILM TEARS) 1.4 % ophthalmic solution Place 2 drops into both eyes as needed for dry eyes. 15 mL 0   QUEtiapine (SEROQUEL) 50 MG tablet Take 1 tablet (50 mg total) by mouth at bedtime. 30 tablet 0   thiamine (VITAMIN B-1) 100 MG tablet Take 1 tablet (100 mg total) by mouth daily.     TRUEplus Lancets 28G MISC Use to check blood sugar three times daily. 100 each 2   zinc  gluconate 50 MG tablet Take 50 mg by mouth daily.     No current facility-administered medications for this visit.    Review of Systems  Constitutional:  Constitutional negative. HENT: HENT negative.  Eyes: Eyes negative.  Respiratory: Respiratory negative.  Cardiovascular: Cardiovascular negative.  GI: Gastrointestinal negative.  Musculoskeletal: Positive for gait problem.  Neurological: Positive for focal weakness.  Hematologic: Hematologic/lymphatic negative.  Psychiatric: Psychiatric negative.        Objective:  Objective   Vitals:   02/12/22 1413  BP: (!) 168/89  Pulse: 90  Resp: 20  Temp: 98.2 F (36.8 C)  SpO2: 93%  Weight: 185 lb (83.9 kg)  Height: 5\' 7"  (1.702 m)   Body mass index is 28.98 kg/m.  Physical Exam HENT:     Head: Normocephalic.     Nose: Nose normal.  Eyes:     Pupils: Pupils are equal, round, and reactive to light.  Cardiovascular:     Rate and Rhythm: Normal rate.     Comments: Strong right ulnar artery signal Neurological:     Mental Status: He is alert.     Data: Right Cephalic   Diameter (cm)Depth (cm)Findings   +-----------------+-------------+----------+---------+  Shoulder            0.09        0.86              +-----------------+-------------+----------+---------+  Prox upper arm       0.20        0.62              +-----------------+-------------+----------+---------+  Mid upper arm        0.17        0.61              +-----------------+-------------+----------+---------+  Dist upper arm       0.15        0.54              +-----------------+-------------+----------+---------+  Antecubital fossa    0.31        0.34              +-----------------+-------------+----------+---------+  Prox forearm         0.15        0.53              +-----------------+-------------+----------+---------+  Mid forearm          0.17        0.63               +-----------------+-------------+----------+---------+  Wrist               0.10        0.47   branching  +-----------------+-------------+----------+---------+   +-----------------+-------------+----------+--------------+  Right Basilic    Diameter (cm)Depth (cm)   Findings     +-----------------+-------------+----------+--------------+  Mid upper arm        0.36        1.74                   +-----------------+-------------+----------+--------------+  Dist upper arm       0.32        0.90                   +-----------------+-------------+----------+--------------+  Antecubital fossa    0.13        0.45                   +-----------------+-------------+----------+--------------+  Prox forearm  not visualized  +-----------------+-------------+----------+--------------+  Mid forearm                             not visualized  +-----------------+-------------+----------+--------------+  Distal forearm                          not visualized  +-----------------+-------------+----------+--------------+   *See table(s) above for measurements and observations.      Assessment/Plan:    62 year old male on dialysis via catheter previously right-hand-dominant but right hand now very weak since open heart surgery and plan is for right arm access.  We discussed the risk and benefits including steal and primary nonfunction and need for further procedures and he demonstrates good understanding.  Will get him scheduled for a right arm fistula versus graft on a nondialysis day in the near future.    Waynetta Sandy MD [facility]

## 2022-02-13 ENCOUNTER — Telehealth: Payer: Self-pay

## 2022-02-13 ENCOUNTER — Other Ambulatory Visit: Payer: Self-pay

## 2022-02-13 DIAGNOSIS — N186 End stage renal disease: Secondary | ICD-10-CM

## 2022-02-13 NOTE — Telephone Encounter (Addendum)
Spoke with April, nurse at [facility]. Patient scheduled for surgery on 03/14/22. Instructions provided- verbalized understanding. Will also fax instructions letter to facility at [phone].

## 2022-02-13 NOTE — Telephone Encounter (Signed)
Multiple calls placed to [facility] but no answer. DPR on file- contacted patient's uncle, [name] but he advised to contact patient's next of kin, his wife [name]. Placed call to Mrs. [name], no answer. Left message to return call.

## 2022-02-19 NOTE — Progress Notes (Incomplete)
ADVANCED HF CLINIC CONSULT NOTE  Primary Care: [name], MD Primary Cardiologist:  HPI: [name] is a 62 y.o.male with a hx of DM2, CAD s/p recent CABG x5, CKD stage II, and CVA.    - 8/19 Pericardial window - 8/20 Cardiac arrest with tamponade -> Emergent bedside washout - 09/29/21 VA Cannulation - 09/30/21 Return to OR for mediastinal hemorrhage - 10/01/21 Developed AF -> amio - 10/02/21 TEE EF 25-30%  - 10/04/21 OR for washout. C/b continued bleeding overnight - 10/07/21 Placement of Impella 5.5 with washout, VA ECMO decannulation. Hypotensive with development of severe RV dysfunction after ECMO off and pressors titrated up. Multiple units of blood products. - 10/08/21 Brief PEA arrest. AFL with RVR >> S/p DCCV to SR, back in AFL shortly after - 8/31 Give 1UPRBCs  - 9/2 OR for chest closure - 9/3 Hypotensive overnight w/ SBPs in 80s. Febrile, mTemp 100.8. CRRT paused. VP increased to 0.04.  - 9/4 s/p bronchoscopy w/ BAL by PCCM, Cx NGTD  - 9/5 vomiting w/ large volume NGT output + watery/foul diarrhea, Tube feeds held. No signs of ileus on KUB. C-diff negative   - 9/7 OR for Impella Extraction and percutaneous tracheostomy. 2 u RBCs. - 9/8 CVVH stopped and line removed for holiday - 9/9 CVVH restarted.  -9/13 Worsening leukocytosis and pressor requirements. Purulent drainage from arterial line. Started Daptomycin, Micafungin and Meropenem. - 9/17 Daptomycin + Micafungin stopped. Remains on Meropenum  - 9/21 transitioned to iHD  - 9/25 Tunneled HD cath placed. Trach decannulation - 9/26 Hgb 6.1>>transfused 2uRBC  - 10/11 gtube placed       Review of Systems: [y] = yes, [ ]  = no   General: Weight gain [ ] ; Weight loss [ ] ; Anorexia [ ] ; Fatigue [ ] ; Fever [ ] ; Chills [ ] ; Weakness [ ]   Cardiac: Chest pain/pressure [ ] ; Resting SOB [ ] ; Exertional SOB [ ] ; Orthopnea [ ] ; Pedal Edema [ ] ; Palpitations [ ] ; Syncope [ ] ; Presyncope [ ] ; Paroxysmal nocturnal dyspnea[ ]    Pulmonary: Cough [ ] ; Wheezing[ ] ; Hemoptysis[ ] ; Sputum [ ] ; Snoring [ ]   GI: Vomiting[ ] ; Dysphagia[ ] ; Melena[ ] ; Hematochezia [ ] ; Heartburn[ ] ; Abdominal pain [ ] ; Constipation [ ] ; Diarrhea [ ] ; BRBPR [ ]   GU: Hematuria[ ] ; Dysuria [ ] ; Nocturia[ ]   Vascular: Pain in legs with walking [ ] ; Pain in feet with lying flat [ ] ; Non-healing sores [ ] ; Stroke [ ] ; TIA [ ] ; Slurred speech [ ] ;  Neuro: Headaches[ ] ; Vertigo[ ] ; Seizures[ ] ; Paresthesias[ ] ;Blurred vision [ ] ; Diplopia [ ] ; Vision changes [ ]   Ortho/Skin: Arthritis [ ] ; Joint pain [ ] ; Muscle pain [ ] ; Joint swelling [ ] ; Back Pain [ ] ; Rash [ ]   Psych: Depression[ ] ; Anxiety[ ]   Heme: Bleeding problems [ ] ; Clotting disorders [ ] ; Anemia [ ]   Endocrine: Diabetes [ ] ; Thyroid dysfunction[ ]    Past Medical History:  Diagnosis Date   AKI (acute kidney injury) ()    pt unaware of this   Anginal pain (HCC)    CAD (coronary artery disease)    a. 03/2015 NSTEMI: LHC with severe 3V CAD  (70% mid RCA, 95% OM1, 90% distal LCx, 90% OM3, 80% prox LAD and 90% ost D1) s/p DES to mLAD w/ small dissction Rx with DES, staged ost Ramus PCI/DES and dLCx s/p PCI/DES    Chest pain 12/24/2020   Diabetes mellitus type 2 in obese (  [place])    Diverticulosis    Dyspnea    Dyspnea on exertion 03/16/2015   Dyspnea on exertion   Family history of adverse reaction to anesthesia    patient father- pt states after anesthesia his father "developed dementia"   GERD (gastroesophageal reflux disease)    Hypercholesteremia    Hypertension associated with diabetes ([place]) 03/16/2015   hypertension   NSTEMI (non-ST elevated myocardial infarction) ([place]) 03/17/2015   Obesity    Stroke ([place]) 2022   pt states he had a "mini stroke" during cardiac catheterization   Tobacco abuse     Current Outpatient Medications  Medication Sig Dispense Refill   acetaminophen (TYLENOL) 325 MG tablet Take 2 tablets (650 mg total) by mouth every 6 (six) hours as needed for  mild pain, headache or fever.     ascorbic acid (VITAMIN C) 500 MG tablet Take 500 mg by mouth daily.     aspirin 81 MG chewable tablet Chew 1 tablet (81 mg total) by mouth daily.     atorvastatin (LIPITOR) 80 MG tablet Take 1 tablet (80 mg total) by mouth daily. 30 tablet 2   Continuous Blood Gluc Sensor (FREESTYLE LIBRE 2 SENSOR) MISC Utilize as directed q 14 days to monitor blood sugar. 2 each 1   folic acid (FOLVITE) 1 MG tablet Take 1 tablet (1 mg total) by mouth daily.     glucose blood (TRUE METRIX BLOOD GLUCOSE TEST) test strip Use to check blood sugar three times daily. 100 each 2   insulin aspart (NOVOLOG) 100 UNIT/ML injection Inject 0-6 Units into the skin every 4 (four) hours. CBG 70-150 ( 0 units) 151-200 (1 unit) 201-250 (2 units) 251-300 ( 3 units) 301-350 ( 4 units) 351-400 (5 units) >400 ( 6 units) 10 mL 11   Insulin Pen Needle (TRUEPLUS 5-BEVEL PEN NEEDLES) 32G X 4 MM MISC Use to inject Basaglar once daily. 100 each 3   leptospermum manuka honey (MEDIHONEY) PSTE paste Apply 1 Application topically daily. Apply Medihoney to sacrum/bilat buttock wound Q day, then cover with gauze and abd pad and tape  Apply thin layer (3 mm) to wound.     midodrine (PROAMATINE) 10 MG tablet Take 1 tablet (10 mg total) by mouth 3 (three) times daily with meals.     multivitamin (RENA-VIT) TABS tablet Take 1 tablet by mouth at bedtime.  0   Nutritional Supplements (FEEDING SUPPLEMENT, OSMOLITE 1.5 CAL,) LIQD Place 237 mLs into feeding tube 4 (four) times daily. Bolus a full carton of Osmolite 1.5 (237 ml) formula via GRAVITY per PEG using a 50-60 cc syringe. Flush PEG with 20 ml before and after each bolus.  Pt is willing to take bolus tube feeds. He would prefer not to be asked each time whether he wants the bolus feed but instead would like it to be administered at the scheduled time.  0   pantoprazole (PROTONIX) 40 MG tablet Take 1 tablet (40 mg total) by mouth 2 (two) times daily.      polyvinyl alcohol (LIQUIFILM TEARS) 1.4 % ophthalmic solution Place 2 drops into both eyes as needed for dry eyes. 15 mL 0   QUEtiapine (SEROQUEL) 50 MG tablet Take 1 tablet (50 mg total) by mouth at bedtime. 30 tablet 0   thiamine (VITAMIN B-1) 100 MG tablet Take 1 tablet (100 mg total) by mouth daily.     TRUEplus Lancets 28G MISC Use to check blood sugar three times daily. 100 each 2   zinc  gluconate 50 MG tablet Take 50 mg by mouth daily.     No current facility-administered medications for this visit.    No Known Allergies    Social History   Socioeconomic History   Marital status: Legally Separated    Spouse name: Not on file   Number of children: 0   Years of education: Not on file   Highest education level: Not on file  Occupational History   Not on file  Tobacco Use   Smoking status: Former    Packs/day: 0.50    Types: Cigarettes    Quit date: 2017    Years since quitting: 7.0   Smokeless tobacco: Never  Vaping Use   Vaping Use: Some days   Start date: 10/14/2017   Last attempt to quit: 09/09/2021  Substance and Sexual Activity   Alcohol use: Yes    Comment: very occasional, maybe a beer or mixed drink once every few months   Drug use: Not Currently   Sexual activity: Not on file  Other Topics Concern   Not on file  Social History Narrative   Not on file   Social Determinants of Health   Financial Resource Strain: Low Risk  (12/06/2021)   Overall Financial Resource Strain (CARDIA)    Difficulty of Paying Living Expenses: Not hard at all  Food Insecurity: No Food Insecurity (12/06/2021)   Hunger Vital Sign    Worried About Running Out of Food in the Last Year: Never true    [place] in the Last Year: Never true  Transportation Needs: No Transportation Needs (12/06/2021)   PRAPARE - Hydrologist (Medical): No    Lack of Transportation (Non-Medical): No  Physical Activity: Not on file  Stress: Not on file  Social  Connections: Not on file  Intimate Partner Violence: Not At Risk (12/06/2021)   Humiliation, Afraid, Rape, and Kick questionnaire    Fear of Current or Ex-Partner: No    Emotionally Abused: No    Physically Abused: No    Sexually Abused: No    No family history on file.  There were no vitals filed for this visit.  PHYSICAL EXAM: General:  NAD. No resp difficulty HEENT: Normal Neck: Supple. No JVD. Carotids 2+ bilat; no bruits. No lymphadenopathy or thryomegaly appreciated. Cor: PMI nondisplaced. Regular rate & rhythm. No rubs, gallops or murmurs. Lungs: Clear Abdomen: Soft, nontender, nondistended. No hepatosplenomegaly. No bruits or masses. Good bowel sounds. Extremities: No cyanosis, clubbing, rash, edema Neuro: Alert & oriented x 3, cranial nerves grossly intact. Moves all 4 extremities w/o difficulty. Affect pleasant.  ECG:  ASSESSMENT & PLAN: 1. Shock - mixed cardiogenic/hemorrhagic -> VA ECMO -> decannulated on 8/28 to Impella 5.5 - Echo 08/28: Underfilled LV, EF 55-60% with severe LVH and near normal RV systsystolic function.  - Impella extracted 9/7 - Bedside echo 09/13 - LV function preserved, RV mildly reduced. Small clot in posterior pericardium but no hemodynamic effect  - Completed total of 14 days of Micafungin (ended 9/27) - Off pressors.  - Stable on lower dose of midodrine on HD days.     2. Cardiac arrest (PEA/bradycardic)  - 8/19 and 8/28 in setting of tamponade - resolved   3. Cardiac tamponade with emergent bedside sternotomy  - Diffuse epicardial bleeding with post-op Dresslers - return to OR 8/21 and 8/24 for washouts.  - Washout 8/28 in OR. Multiple units of blood products in OR.   - Chest closed  on 9/2   4. Acute hypoxemic respiratory failure - Off ECMO.  - Perc Trach placed 9/7  - s/p bronchoscopy w/ BAL 9/4.  - tracheal aspirate 09/13 growing few GPC and rare yeast  - Started back on IV abx as above d/t concern for sepsis.  - Trach  decannulated 9/25  - Resolved. On room air.    5. ESRD - AKI due to ATN, CRRT started 8/28.  - Remains anuric - Transitioned to [facility] 9/21. Nephrology following  - s/p [facility] cath by IR   - tolerating iHD (TTS schedule).    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  - Now off colchicine    7. CAD s/p CABG x 5  09/12/21 - No chest pain. - Continue ASA 81 + statin   8. DM2 - continue SSI    9. PAF/AFL - Tolerates poorly.  - Recurrent AFL. S/p DCCV to SR 08/29.  - Off amio d/t bradycardia - Not on [facility] for now d/t bleeding. - In SR on ECG today.   10. FEN - Underwent gastrostomy tube placement 10/11 - Modified Barium Swallow 10/13: now tolerating Dysphagia 3 diet.  - Getting bolus feeds.  - SLP + RD following.    11. Unstageable Pressure Ulcer, Buttock  - WOC following.  - Started on hydrotherapy.   12. Deconditioning - Continue PT/OT at SNF   Outpatient HD chair approved.   PT/OT following. [facility] pending medicaid approval. HF TOC CM assisting with disposition.   Earnie Larsson, AGACNP-BC

## 2022-02-21 ENCOUNTER — Encounter (HOSPITAL_COMMUNITY): Payer: Self-pay

## 2022-02-26 ENCOUNTER — Ambulatory Visit: Payer: Self-pay | Admitting: Surgery

## 2022-02-27 ENCOUNTER — Other Ambulatory Visit: Payer: Self-pay | Admitting: Surgery

## 2022-02-27 ENCOUNTER — Encounter: Payer: Self-pay | Admitting: Surgery

## 2022-03-13 ENCOUNTER — Other Ambulatory Visit: Payer: Self-pay

## 2022-03-13 ENCOUNTER — Encounter (HOSPITAL_COMMUNITY): Payer: Self-pay | Admitting: Vascular Surgery

## 2022-03-13 NOTE — Pre-Procedure Instructions (Addendum)
Rudean Curt              Mr. Griffing procedure is scheduled on Friday, February 2.  Report to [facility], Main Entrance or Entrance "A" at  6:55 AM.    Call this number if you have problems the morning of surgery: [phone]  This is the number for the Pre- Surgical Desk.              >>>>>Please send patient's Medication Record with medications administrated documentation. ( this information is required prior to OR. This includes medications that may have been on hold for surgery)<<<<<   Do not eat or drink after midnight.   Take these medicines the morning of surgery with A SIP OF WATER : ASA Protonix Inhaler- scheduled Midodrine  Prn: Tylenol Prn Inhalers.    STOP taking  Aspirin Products (Goody Powder, Excedrin Migraine), Ibuprofen (Advil), Naproxen (Aleve), Vitamins and Herbal Products (ie Fish Oil).    Check Mr. Clydia Llano blood sugar the morning of your surgery when you wake up and every 2 hours until you leave to go to the hospital.  If your blood sugar is less than 70 mg/dL, you will need to treat for low blood sugar: Do not take insulin. Treat a low blood sugar (less than 70 mg/dL) with  cup of clear juice (cranberry or apple), 4 glucose tablets, OR glucose gel. Recheck blood sugar in 15 minutes after treatment (to make sure it is greater than 70 mg/dL). If your blood sugar is not greater than 70 mg/dL on recheck, call [phone]  for further instructions.  The morning of surgery Mr Astorga should have good wash up with antibacteria soap, the morning of surgery . Dry off with a clean towel.  Patient should not have lotions, powders, colognes, deodorant, jewelry, or piercing's. Wear clean comfortable clothes.  Brush teeth. Patient may shave face if he desires.    Do not apply any deodorants/lotions, powders or colognes.    Contacts, dentures or bridgework may not be worn into surgery.   [facility] is not responsible for any belongings or  valuables.

## 2022-03-13 NOTE — Progress Notes (Signed)
I made several calls to [facility] asking for patient's med record, and to speak to Mr. Yordy nurse. I did get the  [facility]- a small amount on the left margin was cut off, I had difficulty reading some of the inhalers.    I was able to speak with April, unit coordinator, April said that it is ok that I could not read all of the inhaler, 'he has not been taking it."  April reports that patient no longer has the trach, feeding tube, patient is not taking feedings.   Mr. Vecchione is type II diabetes, April reported that last A1C was drawn on 01/31/22- it was 6.0.  April also reported that she does not think they are checking CBGs.  April reported that last blood pressure was 123/73.  April is not sure about Midodrine, if he is taking it.  April stated that patient is alert and oriented, trach stoma has closed, patient has a sacral- April has not gotten an update on the wound.  I faxed Pre-Procedural instructions, I gave April my phone number, I asked her to call me if she has not received instructions or if she has any questions.

## 2022-03-13 NOTE — Anesthesia Preprocedure Evaluation (Addendum)
Anesthesia Evaluation  Patient identified by MRN, date of birth, ID band Patient awake    Reviewed: Allergy & Precautions, NPO status , Patient's Chart, lab work & pertinent test results  History of Anesthesia Complications Negative for: history of anesthetic complications  Airway Mallampati: III  TM Distance: >3 FB Neck ROM: Full    Dental no notable dental hx. (+) Dental Advisory Given,    Pulmonary Patient abstained from smoking., former smoker   Pulmonary exam normal        Cardiovascular hypertension, Pt. on medications and Pt. on home beta blockers + CAD, + Past MI (2017), + Cardiac Stents and + CABG  Normal cardiovascular exam  Echo (Limited) 10/23/21: IMPRESSIONS   1. Left ventricular ejection fraction, by estimation, is 65 to 70%. The  left ventricle has normal function. The left ventricle has no regional  wall motion abnormalities. There is mild concentric left ventricular  hypertrophy. Left ventricular diastolic  parameters are consistent with Grade I diastolic dysfunction (impaired  relaxation).   2. Right ventricular systolic function is mildly reduced. The right  ventricular size is normal.   3. Aortic valve regurgitation is trivial. No aortic stenosis is present.   4. The mitral valve was not well visualized. No evidence of mitral valve  regurgitation.   5. Technically very difficult echo, limited images available.       Neuro/Psych CVA  negative psych ROS   GI/Hepatic negative GI ROS, Neg liver ROS,,,  Endo/Other  diabetes, Type 2, Insulin Dependent    Renal/GU Renal disease  negative genitourinary   Musculoskeletal negative musculoskeletal ROS (+)    Abdominal   Peds  Hematology negative hematology ROS (+)   Anesthesia Other Findings Day of surgery medications reviewed with patient.  Reproductive/Obstetrics negative OB ROS                             Anesthesia  Physical Anesthesia Plan  ASA: 4  Anesthesia Plan: MAC and Regional   Post-op Pain Management: Tylenol PO (pre-op)*   Induction: Intravenous  PONV Risk Score and Plan: 2 and Treatment may vary due to age or medical condition, Ondansetron, Dexamethasone and Midazolam  Airway Management Planned: Oral ETT  Additional Equipment: None  Intra-op Plan:   Post-operative Plan:   Informed Consent: I have reviewed the patients History and Physical, chart, labs and discussed the procedure including the risks, benefits and alternatives for the proposed anesthesia with the patient or authorized representative who has indicated his/her understanding and acceptance.     Dental advisory given  Plan Discussed with: Anesthesiologist and CRNA  Anesthesia Plan Comments: (See PAT note written 03/13/2022 by Myra Gianotti, PA-C. S/p CABG 09/12/21. Readmission 09/28/21-12/03/21 for pericardial effusion, cardiogenic shock, s/p right VATS for drainage of pericardial and right pleural effusions 09/28/21, ECMO->Impella LVAD 10/07/21-10/17/21; tracheostomy 10/17/21 - 11/05/21. Post-CABG PAF (amiodarone discontinued 10/23/21 due to junctional bradycardia, no anticoagulation due to recurrent mediastinal bleeding). 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 DM2.    )        Anesthesia Quick Evaluation

## 2022-03-13 NOTE — Progress Notes (Signed)
Anesthesia Chart Review:  Case: [id number] Date/Time: 03/14/22 0851   Procedure: RIGHT ARM ARTERIOVENOUS (AV) FISTULA VERSUS ARTERIOVENOUS GRAFT CREATION (Right)   Anesthesia type: Choice   Pre-op diagnosis: ESRD   Location: [facility] OR ROOM 12 / [place] OR   Surgeons: Waynetta Sandy, MD       DISCUSSION: Patient is a 62 year old male scheduled for the above procedure. He is currently undergoing hemodialysis via left IJ TDC.  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   [facility] admission for CABG 09/12/21 - 09/20/21. He required short term supplement oxygen post extubation. He had post-operative PAF treated with amiodarone, Lopressor, and Eliquis. He required Lasix for several days for volume excess, but then held due to rising Creatinine.   Readmitted to [facility] 09/28/21 - 12/03/21 pericardial effusion and cardiogenic shock. He initially presented by EMS for progressive dyspnea and bradycardic requiring atropine. He also required D50 for glucose 49. Echo showed EF 50%, L > R pleural effusion, moderate pericardial effusion with concern for tamponade. Cardiology and CT surgery consulted. S/p right VATS for evacuation of pericardial and right pleural effusions 09/28/21. He required bedside mediastinal re-exploration after becoming unresponsive, PEA arrest requiring CPR for ~ 1 minute. Bedside echo showed large posterior clot which was  removed and irrigated. Chest tubes placed and mediastinum packed. He remained critical and decision made to proceed with [place] ECMO 09/29/21. He returned to the OR 09/30/21 for mediastinal re-exploration and for bleeding from chest with generalized coagulopathy found. Started on amiodarone for afib 10/01/21. TEE 10/02/21 showed EF 25-30% with moderate-severe RV dysfunction. Back to OR 10/03/21 for mediastinal washout and removal of intrapericardial mediastinal hematoma.  A wound VAC was applied to the open chest wound.  ECMO support continued and he continued to be coagulopathic with significant chest tube drainage.  He was transfused PRBC and platelets. Nephrology consulted for worsening CKD and CRRT initiated (ultimately transitioned to hemodialysis). He returned to OR on 10/07/21 for insertion of Impella, weaning for [facility] ECMO and decannulation, and mediastinal washout. Milrinone was stopped on 10/09/21. Epinephrine, Vasopressin, and Norepinephrine drips continued to be weaned as able. S/p mediastinal washout and closure of sternum on 10/12/21. S/p removal of right axillary artery Impella and s/p percutaneous tracheostomy 10/17/21 (decannulated 11/05/21). Amiodarone discontinued 10/23/21 for junctional bradycardia. No anticoagulation for PAF given bleeding. 10/23/21 limited echo showed LVEF 65-70%, normal LV function, no regional wall abnormalities, grade 1 diastolic dysfunction, mildly reduced RVSF. Trivial AR. He had been weaned off pressors, stable on midodrine on hemodialysis days. HF cardiologist felt he was stable from a cardiac perspective and signed off. He completed course of antibiotics and Micafungin (tracheal aspirate showed GPC and rare yeast). He required wound care and hydrotherapy for pressure ulcer. Discharged to SNF for ongoing rehabilitation 12/03/21. He had a gastrostomy tube 11/20/21-01/20/22.  He currently resides at [facility]. Last CT surgery follow-up visit was on 12/25/21 with Dr. Gilford Raid. He noted, "Overall I think he has made a miraculous recovery following his extremely complicated course.  He continues on intermittent hemodialysis. The goal at this time is continued physical therapy to get him stronger as well as continue dressing changes of his sacral pressure sore to get that completely healed. He has not seen cardiology in follow-up and we will schedule an appointment for him." He has not had cardiology or CT surgery follow-up since 12/25/21 visit.   Discussed case with anesthesiologist Josephine Igo, MD. Plan for updated EKG and labs on arrival. Anesthesia team to evaluate on the day of surgery and definitive plan at that time.    VS:  BP Readings from Last 3 Encounters:  02/12/22 (!) 168/89  12/25/21 (!) 134/93  12/06/21 (!) 140/87   Pulse Readings from Last 3 Encounters:  02/12/22 90  12/25/21 94  12/06/21 95     PROVIDERS: [name], MD is PCP  Gwyndolyn Kaufman, MD is cardiologist  Gilford Raid, MD is CT surgeon   LABS: Most recent lab results in [facility] include: Lab Results  Component Value Date   WBC 9.5 11/28/2021   HGB 10.4 (L) 11/28/2021   HCT 33.9 (L) 11/28/2021   PLT 360 11/28/2021   GLUCOSE 94 12/06/2021   ALT 22 10/22/2021   AST 38 10/22/2021   NA 140 12/06/2021   K 5.7 (H) 12/06/2021   CL 96 (L) 12/06/2021   CREATININE 8.71 (H) 12/06/2021   BUN 39 (H) 12/06/2021   CO2 24 12/06/2021   TSH 0.460 03/12/2021   INR 1.1 11/20/2021   HGBA1C 6.5 (H) 09/10/2021    OTHER: Overnight EEG 03/12/21: IMPRESSION: This study is within normal limits. No seizures or epileptiform discharges were seen throughout the recording.   Spirometry 12/27/20: FVC 1.50 (41%), FEV1 1.23 (43%), FEV1/FVC 82% (104%)     IMAGES: CXR 12/25/21:  FINDINGS: Status post median sternotomy. Left internal jugular dual-lumen central venous catheter tips again overlie the superior vena cava/right atrial junction. Cardiac silhouette is again  moderately enlarged. Left mid to lower lung horizontal linear IV assessment atelectasis versus scarring, similar to prior CT. No pleural effusion pneumothorax. Mild multilevel degenerative disc changes of the midthoracic spine. Right axillary surgical clips. IMPRESSION: - Unchanged moderate enlargement of the cardiac silhouette. - No acute lung process.     EKG:  EKG 10/23/21 (during admission for cardiogenic shock): Junctional rhythm Non-specific intra-ventricular conduction delay T wave abnormality, consider inferolateral ischemia Abnormal ECG When compared with ECG of 29-Sep-2021 06:56, junctional rhythm is new Confirmed by Oswaldo Milian [id number]) on 10/24/2021 9:58:27 PM - Amiodarone discontinued at that time.     CV: Echo (Limited) 10/23/21: IMPRESSIONS   1. Left ventricular ejection fraction, by estimation, is 65 to 70%. The  left ventricle has normal function. The left ventricle has no regional  wall motion abnormalities. There is mild concentric left ventricular  hypertrophy. Left ventricular diastolic  parameters are consistent with Grade I diastolic dysfunction (impaired  relaxation).   2. Right ventricular systolic function is mildly reduced. The right  ventricular size is normal.   3. Aortic valve regurgitation is trivial. No aortic stenosis is present.   4. The mitral valve was not well visualized. No evidence of mitral valve  regurgitation.   5. Technically very difficult echo, limited images available.     US Carotid 12/28/20: Summary:  - Right Carotid: Velocities in the right ICA are consistent with a 1-39% stenosis.  - Left Carotid: Velocities in the left ICA are consistent with a 1-39% stenosis.  - Vertebrals: Right vertebral artery demonstrates antegrade flow. Left vertebral  artery was not visualized.      Last Cardiac cath was pre-CABG on 12/26/20.    Past Medical History:  Diagnosis Date   AKI (acute kidney injury) ([place])    pt unaware of  this   Anginal pain ([place])    CAD (coronary artery disease)    a. 03/2015 NSTEMI: LHC with severe 3V CAD  (70% mid RCA, 95% OM1, 90% distal LCx, 90% OM3, 80% prox LAD and 90% ost D1) s/p DES to mLAD w/ small dissction Rx with DES, staged ost [place]us PCI/DES and dLCx s/p PCI/DES    Chest pain 12/24/2020   Diabetes mellitus type 2 in obese [facility])    Diverticulosis    Dyspnea    Dyspnea on exertion 03/16/2015   Dyspnea on exertion   Family history of adverse reaction to anesthesia    patient father- pt states after anesthesia his father "developed dementia"   GERD (gastroesophageal reflux disease)    Hypercholesteremia    Hypertension associated with diabetes ([place]) 03/16/2015   hypertension   NSTEMI (non-ST elevated myocardial infarction) ([place]) 03/17/2015   Obesity    Stroke ([place]) 2022   pt states he had a "mini stroke" during cardiac catheterization   Tobacco abuse     Past Surgical History:  Procedure Laterality Date   APPLICATION OF WOUND VAC N/A 10/03/2021   Procedure: APPLICATION OF WOUND VAC;  Surgeon: Dahlia Byes, MD;  Location: [place];  Service: Thoracic;  Laterality: N/A;   CANNULATION FOR ECMO (EXTRACORPOREAL MEMBRANE OXYGENATION) N/A 09/29/2021   Procedure: CANNULATION FOR ECMO (EXTRACORPOREAL MEMBRANE OXYGENATION);  Surgeon: Lajuana Matte, MD;  Location: [place];  Service: Open Heart Surgery;  Laterality: N/A;   CARDIAC CATHETERIZATION N/A 03/19/2015   Procedure: Left Heart Cath and Coronary Angiography;  Surgeon: Lorretta Harp, MD;  Location: [place] CV LAB;  Service: Cardiovascular;  Laterality: N/A;   CARDIAC CATHETERIZATION N/A 03/20/2015   Procedure: Coronary Stent Intervention;  Surgeon: Lorretta Harp, MD;  Location: [place] CV LAB;  Service: Cardiovascular;  Laterality: N/A;   CARDIAC CATHETERIZATION N/A 03/22/2015   Procedure: Coronary Stent Intervention;  Surgeon: Lorretta Harp, MD;  Location: [place] CV LAB;  Service: Cardiovascular;   Laterality: N/A;   CORONARY ARTERY BYPASS GRAFT N/A 09/12/2021   Procedure: CORONARY ARTERY BYPASS GRAFTING (CABG) X 5 USING LEFT INTERNAL MAMMARY ARTERY AND ENDOSCOPICALLY HARVESTED RIGHT GREATER SAPHENOUS VEIN.;  Surgeon: [name], MD;  Location: [place];  Service: Open Heart Surgery;  Laterality: N/A;   EXPLORATION POST OPERATIVE OPEN HEART N/A 09/30/2021   Procedure: EXPLORATION POST OPERATIVE OPEN HEART WASHOUT;  Surgeon: Lajuana Matte, MD;  Location: [place];  Service: Open Heart Surgery;  Laterality: N/A;   IR FLUORO GUIDE CV LINE LEFT  11/04/2021   IR FLUORO GUIDE CV LINE LEFT  12/06/2021   IR GASTROSTOMY TUBE MOD SED  11/15/2021   IR GASTROSTOMY TUBE MOD SED  11/20/2021   IR GASTROSTOMY TUBE REMOVAL  01/20/2022   IR US GUIDE VASC ACCESS LEFT  11/04/2021   IR VENOCAVAGRAM SVC  12/06/2021   LEFT HEART CATH AND CORONARY ANGIOGRAPHY N/A 12/25/2020   Procedure: LEFT HEART CATH AND CORONARY ANGIOGRAPHY;  Surgeon: Troy Sine, MD;  Location: [place] CV LAB;  Service: Cardiovascular;  Laterality: N/A;   LEFT HEART CATH AND CORONARY ANGIOGRAPHY N/A 12/26/2020   Procedure: LEFT HEART CATH AND CORONARY ANGIOGRAPHY;  Surgeon: Troy Sine, MD;  Location: [place] CV LAB;  Service: Cardiovascular;  Laterality: N/A;   MEDIASTINAL EXPLORATION N/A 10/03/2021   Procedure: MEDIASTINAL WASHOUT;  Surgeon: Dahlia Byes, MD;  Location: [place];  Service: Thoracic;  Laterality: N/A;  PUMP STANDBY   MEDIASTINAL EXPLORATION N/A 10/12/2021   Procedure: MEDIASTINAL WASHOUT;  Surgeon: [name], MD;  Location: [place];  Service: Thoracic;  Laterality: N/A;   PERCUTANEOUS TRACHEOSTOMY N/A 10/17/2021   Procedure: PERCUTANEOUS TRACHEOSTOMY USING SHILEY FLEXIBLE 8 mm CUFFED [place].;  Surgeon: Candee Furbish, MD;  Location: [place];  Service: Pulmonary;  Laterality: N/A;  Percutaneous tracheostomy   PERICARDIAL WINDOW Right 09/28/2021   Procedure: PERICARDIAL WINDOW;  Surgeon: Lajuana Matte, MD;   Location: [place];  Service: Thoracic;  Laterality: Right;  Right VATS.  Lazy lateral.  double lumen ET tube   PLACEMENT OF IMPELLA LEFT VENTRICULAR ASSIST DEVICE N/A 10/07/2021   Procedure: INSERTION OF RIGHT AXILLARY IMPELLA, DECANNULATION OF ECMO, AND MEDIASTINAL WASHOUT;  Surgeon: [name], MD;  Location: [place];  Service: Open Heart Surgery;  Laterality: N/A;  Open right axillary with 8 mm Hemashield Platinum Vascular graft.   RADIOLOGY WITH ANESTHESIA N/A 11/20/2021   Procedure: G-Tube Placement;  Surgeon: Sandi Mariscal, MD;  Location: [place];  Service: Radiology;  Laterality: N/A;   REMOVAL OF IMPELLA LEFT VENTRICULAR ASSIST DEVICE Right 10/17/2021   Procedure: REMOVAL OF IMPELLA LEFT VENTRICULAR ASSIST DEVICE;  Surgeon: [name], MD;  Location: [place];  Service: Open Heart Surgery;  Laterality: Right;   STERNAL CLOSURE N/A 10/12/2021   Procedure: STERNAL CLOSURE;  Surgeon: [name], MD;  [facility];  Service: Thoracic;  Laterality: N/A;   TEE WITHOUT CARDIOVERSION N/A 09/12/2021   Procedure: TRANSESOPHAGEAL ECHOCARDIOGRAM (TEE);  Surgeon: [name], MD;  Location: [place];  Service: Open Heart Surgery;  Laterality: N/A;   TEE WITHOUT CARDIOVERSION N/A 09/29/2021   Procedure: TRANSESOPHAGEAL ECHOCARDIOGRAM (TEE);  Surgeon: Lajuana Matte, MD;  Location: [place];  Service: Open Heart Surgery;  Laterality: N/A;   TEE WITHOUT CARDIOVERSION N/A 10/03/2021   Procedure: TRANSESOPHAGEAL ECHOCARDIOGRAM (TEE);  Surgeon: Dahlia Byes, MD;  Location: [place];  Service: Thoracic;  Laterality: N/A;   TEE WITHOUT CARDIOVERSION  10/07/2021   Procedure: TRANSESOPHAGEAL ECHOCARDIOGRAM (TEE);  Surgeon: [name], MD;  [facility];  Service: Open Heart Surgery;;   TEE WITHOUT CARDIOVERSION N/A 10/12/2021   Procedure: TRANSESOPHAGEAL ECHOCARDIOGRAM (TEE);  Surgeon: [name], MD;  Location: [facility] OR;  Service: Thoracic;  Laterality: N/A;   TEE WITHOUT CARDIOVERSION N/A 10/17/2021    Procedure: TRANSESOPHAGEAL ECHOCARDIOGRAM (TEE);  Surgeon: [name], MD;  Location: [place];  Service: Open Heart Surgery;  Laterality: N/A;   TESTICLE SURGERY  1970   pt states testicle was ascended and had to be "pulled down"   TONSILLECTOMY     as a child    MEDICATIONS: No current facility-administered medications for this encounter.    acetaminophen (TYLENOL) 325 MG tablet   ascorbic acid (VITAMIN C) 500 MG tablet   aspirin 81 MG chewable tablet   atorvastatin (LIPITOR) 80 MG tablet   Continuous Blood Gluc Sensor (FREESTYLE LIBRE 2 SENSOR) MISC   folic acid (FOLVITE) 1 MG tablet   glucose blood (TRUE METRIX BLOOD GLUCOSE TEST) test strip   insulin aspart (NOVOLOG) 100 UNIT/ML injection   Insulin Pen Needle (TRUEPLUS 5-BEVEL PEN NEEDLES) 32G X 4 MM MISC   leptospermum manuka honey (MEDIHONEY) PSTE paste   midodrine (PROAMATINE) 10 MG  tablet   multivitamin (RENA-VIT) TABS tablet   Nutritional Supplements (FEEDING SUPPLEMENT, OSMOLITE 1.5 CAL,) LIQD   pantoprazole (PROTONIX) 40 MG tablet   polyvinyl alcohol (LIQUIFILM TEARS) 1.4 % ophthalmic solution   QUEtiapine (SEROQUEL) 50 MG tablet   thiamine (VITAMIN B-1) 100 MG tablet   TRUEplus Lancets 28G MISC   zinc gluconate 50 MG tablet    Myra Gianotti, PA-C Surgical Short Stay/Anesthesiology [facility] Phone [phone] [facility] Phone [phone] 03/13/2022 1:27 PM

## 2022-03-14 ENCOUNTER — Ambulatory Visit (HOSPITAL_COMMUNITY)
Admission: RE | Admit: 2022-03-14 | Discharge: 2022-03-14 | Disposition: A | Discharge status: Skilled Nursing Facility | Payer: Medicare Other | Attending: Vascular Surgery | Admitting: Vascular Surgery

## 2022-03-14 ENCOUNTER — Encounter (HOSPITAL_COMMUNITY)
Admission: RE | Disposition: A | Discharge status: Skilled Nursing Facility | Payer: Self-pay | Source: Home / Self Care | Attending: Vascular Surgery

## 2022-03-14 ENCOUNTER — Encounter (HOSPITAL_COMMUNITY): Payer: Self-pay | Admitting: Vascular Surgery

## 2022-03-14 ENCOUNTER — Ambulatory Visit (HOSPITAL_COMMUNITY): Payer: Medicare Other | Admitting: Vascular Surgery

## 2022-03-14 ENCOUNTER — Ambulatory Visit (HOSPITAL_BASED_OUTPATIENT_CLINIC_OR_DEPARTMENT_OTHER): Payer: Medicare Other | Admitting: Vascular Surgery

## 2022-03-14 ENCOUNTER — Other Ambulatory Visit: Payer: Self-pay

## 2022-03-14 DIAGNOSIS — N186 End stage renal disease: Secondary | ICD-10-CM | POA: Insufficient documentation

## 2022-03-14 DIAGNOSIS — I252 Old myocardial infarction: Secondary | ICD-10-CM | POA: Diagnosis not present

## 2022-03-14 DIAGNOSIS — Z79899 Other long term (current) drug therapy: Secondary | ICD-10-CM | POA: Diagnosis not present

## 2022-03-14 DIAGNOSIS — I2510 Atherosclerotic heart disease of native coronary artery without angina pectoris: Secondary | ICD-10-CM | POA: Insufficient documentation

## 2022-03-14 DIAGNOSIS — Z87891 Personal history of nicotine dependence: Secondary | ICD-10-CM

## 2022-03-14 DIAGNOSIS — K219 Gastro-esophageal reflux disease without esophagitis: Secondary | ICD-10-CM | POA: Insufficient documentation

## 2022-03-14 DIAGNOSIS — Z09 Encounter for follow-up examination after completed treatment for conditions other than malignant neoplasm: Secondary | ICD-10-CM | POA: Insufficient documentation

## 2022-03-14 DIAGNOSIS — Z951 Presence of aortocoronary bypass graft: Secondary | ICD-10-CM | POA: Diagnosis not present

## 2022-03-14 DIAGNOSIS — I480 Paroxysmal atrial fibrillation: Secondary | ICD-10-CM | POA: Diagnosis not present

## 2022-03-14 DIAGNOSIS — Z955 Presence of coronary angioplasty implant and graft: Secondary | ICD-10-CM | POA: Diagnosis not present

## 2022-03-14 DIAGNOSIS — E1122 Type 2 diabetes mellitus with diabetic chronic kidney disease: Secondary | ICD-10-CM

## 2022-03-14 DIAGNOSIS — I080 Rheumatic disorders of both mitral and aortic valves: Secondary | ICD-10-CM | POA: Insufficient documentation

## 2022-03-14 DIAGNOSIS — Z931 Gastrostomy status: Secondary | ICD-10-CM | POA: Diagnosis not present

## 2022-03-14 DIAGNOSIS — E7800 Pure hypercholesterolemia, unspecified: Secondary | ICD-10-CM | POA: Insufficient documentation

## 2022-03-14 DIAGNOSIS — Z794 Long term (current) use of insulin: Secondary | ICD-10-CM

## 2022-03-14 DIAGNOSIS — Z8673 Personal history of transient ischemic attack (TIA), and cerebral infarction without residual deficits: Secondary | ICD-10-CM | POA: Diagnosis not present

## 2022-03-14 DIAGNOSIS — E1159 Type 2 diabetes mellitus with other circulatory complications: Secondary | ICD-10-CM | POA: Diagnosis not present

## 2022-03-14 DIAGNOSIS — I120 Hypertensive chronic kidney disease with stage 5 chronic kidney disease or end stage renal disease: Secondary | ICD-10-CM

## 2022-03-14 DIAGNOSIS — N185 Chronic kidney disease, stage 5: Secondary | ICD-10-CM

## 2022-03-14 DIAGNOSIS — I1311 Hypertensive heart and chronic kidney disease without heart failure, with stage 5 chronic kidney disease, or end stage renal disease: Secondary | ICD-10-CM | POA: Diagnosis present

## 2022-03-14 HISTORY — PX: AV FISTULA PLACEMENT: SHX1204

## 2022-03-14 HISTORY — DX: End stage renal disease: N18.6

## 2022-03-14 HISTORY — DX: Dependence on renal dialysis: Z99.2

## 2022-03-14 LAB — POCT I-STAT, CHEM 8
BUN: 24 mg/dL — ABNORMAL HIGH (ref 8–23)
Calcium, Ion: 1.14 mmol/L — ABNORMAL LOW (ref 1.15–1.40)
Chloride: 100 mmol/L (ref 98–111)
Creatinine, Ser: 7.4 mg/dL — ABNORMAL HIGH (ref 0.61–1.24)
Glucose, Bld: 93 mg/dL (ref 70–99)
HCT: 43 % (ref 39.0–52.0)
Hemoglobin: 14.6 g/dL (ref 13.0–17.0)
Potassium: 4.1 mmol/L (ref 3.5–5.1)
Sodium: 137 mmol/L (ref 135–145)
TCO2: 26 mmol/L (ref 22–32)

## 2022-03-14 LAB — GLUCOSE, CAPILLARY
Glucose-Capillary: 120 mg/dL — ABNORMAL HIGH (ref 70–99)
Glucose-Capillary: 97 mg/dL (ref 70–99)
Glucose-Capillary: 168 mg/dL — ABNORMAL HIGH (ref 70–99)

## 2022-03-14 SURGERY — ARTERIOVENOUS (AV) FISTULA CREATION
Anesthesia: General | Site: Arm Upper | Laterality: Right

## 2022-03-14 MED ORDER — SODIUM CHLORIDE 0.9 % IV SOLN: INTRAVENOUS | Status: DC

## 2022-03-14 MED ORDER — PROPOFOL 1000 MG/100ML IV EMUL
INTRAVENOUS | Status: AC
  Filled 2022-03-14: qty 100

## 2022-03-14 MED ORDER — FENTANYL CITRATE (PF) 100 MCG/2ML IJ SOLN: 25.0000 ug | INTRAMUSCULAR | Status: DC | PRN

## 2022-03-14 MED ORDER — PHENYLEPHRINE 80 MCG/ML (10ML) SYRINGE FOR IV PUSH (FOR BLOOD PRESSURE SUPPORT)
PREFILLED_SYRINGE | INTRAVENOUS | Status: DC | PRN
  Administered 2022-03-14: 80 ug via INTRAVENOUS

## 2022-03-14 MED ORDER — HEPARIN 6000 UNIT IRRIGATION SOLUTION
Status: DC | PRN
  Administered 2022-03-14: 1

## 2022-03-14 MED ORDER — OXYCODONE-ACETAMINOPHEN 5-325 MG PO TABS: 1.0000 | ORAL_TABLET | Freq: Four times a day (QID) | ORAL | 0 refills | Status: DC | PRN

## 2022-03-14 MED ORDER — ONDANSETRON HCL 4 MG/2ML IJ SOLN
INTRAMUSCULAR | Status: AC
  Filled 2022-03-14: qty 2

## 2022-03-14 MED ORDER — ONDANSETRON HCL 4 MG/2ML IJ SOLN
INTRAMUSCULAR | Status: DC | PRN
  Administered 2022-03-14: 4 mg via INTRAVENOUS

## 2022-03-14 MED ORDER — MIDAZOLAM HCL 2 MG/2ML IJ SOLN
INTRAMUSCULAR | Status: AC
  Administered 2022-03-14: 1 mg via INTRAVENOUS
  Filled 2022-03-14: qty 2

## 2022-03-14 MED ORDER — CEFAZOLIN SODIUM-DEXTROSE 2-4 GM/100ML-% IV SOLN
2.0000 g | INTRAVENOUS | Status: AC
  Administered 2022-03-14: 2 g via INTRAVENOUS

## 2022-03-14 MED ORDER — PHENYLEPHRINE 80 MCG/ML (10ML) SYRINGE FOR IV PUSH (FOR BLOOD PRESSURE SUPPORT)
PREFILLED_SYRINGE | INTRAVENOUS | Status: AC
  Filled 2022-03-14: qty 10

## 2022-03-14 MED ORDER — PROPOFOL 10 MG/ML IV BOLUS
INTRAVENOUS | Status: DC | PRN
  Administered 2022-03-14: 50 mg via INTRAVENOUS

## 2022-03-14 MED ORDER — CHLORHEXIDINE GLUCONATE 0.12 % MT SOLN
OROMUCOSAL | Status: AC
  Administered 2022-03-14: 15 mL via OROMUCOSAL
  Filled 2022-03-14: qty 15

## 2022-03-14 MED ORDER — FENTANYL CITRATE (PF) 100 MCG/2ML IJ SOLN
INTRAMUSCULAR | Status: AC
  Administered 2022-03-14: 100 ug via INTRAVENOUS
  Filled 2022-03-14: qty 2

## 2022-03-14 MED ORDER — PHENYLEPHRINE HCL-NACL 20-0.9 MG/250ML-% IV SOLN
INTRAVENOUS | Status: DC | PRN
  Administered 2022-03-14: 25 ug/min via INTRAVENOUS

## 2022-03-14 MED ORDER — CHLORHEXIDINE GLUCONATE 4 % EX LIQD: 60.0000 mL | Freq: Once | CUTANEOUS | Status: DC

## 2022-03-14 MED ORDER — LABETALOL HCL 5 MG/ML IV SOLN
INTRAVENOUS | Status: DC | PRN
  Administered 2022-03-14: 2.5 mg via INTRAVENOUS

## 2022-03-14 MED ORDER — INSULIN ASPART 100 UNIT/ML IJ SOLN: 0.0000 [IU] | INTRAMUSCULAR | Status: DC | PRN

## 2022-03-14 MED ORDER — MIDAZOLAM HCL 2 MG/2ML IJ SOLN
INTRAMUSCULAR | Status: AC
  Filled 2022-03-14: qty 2

## 2022-03-14 MED ORDER — MIDAZOLAM HCL 2 MG/2ML IJ SOLN: 1.0000 mg | Freq: Once | INTRAMUSCULAR | Status: AC

## 2022-03-14 MED ORDER — AMISULPRIDE (ANTIEMETIC) 5 MG/2ML IV SOLN: 10.0000 mg | Freq: Once | INTRAVENOUS | Status: DC | PRN

## 2022-03-14 MED ORDER — FENTANYL CITRATE (PF) 250 MCG/5ML IJ SOLN
INTRAMUSCULAR | Status: DC | PRN
  Administered 2022-03-14: 25 ug via INTRAVENOUS

## 2022-03-14 MED ORDER — ORAL CARE MOUTH RINSE: 15.0000 mL | Freq: Once | OROMUCOSAL | Status: AC

## 2022-03-14 MED ORDER — ACETAMINOPHEN 500 MG PO TABS
1000.0000 mg | ORAL_TABLET | Freq: Once | ORAL | Status: AC
  Administered 2022-03-14: 1000 mg via ORAL
  Filled 2022-03-14: qty 2

## 2022-03-14 MED ORDER — 0.9 % SODIUM CHLORIDE (POUR BTL) OPTIME
TOPICAL | Status: DC | PRN
  Administered 2022-03-14: 1000 mL

## 2022-03-14 MED ORDER — FENTANYL CITRATE (PF) 250 MCG/5ML IJ SOLN
INTRAMUSCULAR | Status: AC
  Filled 2022-03-14: qty 5

## 2022-03-14 MED ORDER — LABETALOL HCL 5 MG/ML IV SOLN
INTRAVENOUS | Status: AC
  Filled 2022-03-14: qty 4

## 2022-03-14 MED ORDER — DEXAMETHASONE SODIUM PHOSPHATE 10 MG/ML IJ SOLN
INTRAMUSCULAR | Status: DC | PRN
  Administered 2022-03-14: 5 mg

## 2022-03-14 MED ORDER — METOPROLOL SUCCINATE ER 25 MG PO TB24
12.5000 mg | ORAL_TABLET | Freq: Once | ORAL | Status: AC
  Administered 2022-03-14: 12.5 mg via ORAL
  Filled 2022-03-14: qty 1

## 2022-03-14 MED ORDER — LIDOCAINE-EPINEPHRINE (PF) 1.5 %-1:200000 IJ SOLN
INTRAMUSCULAR | Status: DC | PRN
  Administered 2022-03-14: 30 mL via PERINEURAL

## 2022-03-14 MED ORDER — FENTANYL CITRATE (PF) 100 MCG/2ML IJ SOLN: 100.0000 ug | Freq: Once | INTRAMUSCULAR | Status: AC

## 2022-03-14 MED ORDER — CEFAZOLIN SODIUM-DEXTROSE 2-4 GM/100ML-% IV SOLN
INTRAVENOUS | Status: AC
  Filled 2022-03-14: qty 100

## 2022-03-14 MED ORDER — LACTATED RINGERS IV SOLN: INTRAVENOUS | Status: DC

## 2022-03-14 MED ORDER — PROPOFOL 500 MG/50ML IV EMUL
INTRAVENOUS | Status: DC | PRN
  Administered 2022-03-14: 20 ug/kg/min via INTRAVENOUS

## 2022-03-14 MED ORDER — CHLORHEXIDINE GLUCONATE 0.12 % MT SOLN: 15.0000 mL | Freq: Once | OROMUCOSAL | Status: AC

## 2022-03-14 MED ORDER — PROMETHAZINE HCL 25 MG/ML IJ SOLN: 6.2500 mg | INTRAMUSCULAR | Status: DC | PRN

## 2022-03-14 SURGICAL SUPPLY — 30 items
ARMBAND PINK RESTRICT EXTREMIT (MISCELLANEOUS) ×1 IMPLANT
BAG COUNTER SPONGE SURGICOUNT (BAG) ×1 IMPLANT
CANISTER SUCT 3000ML PPV (MISCELLANEOUS) ×1 IMPLANT
CLIP LIGATING EXTRA MED SLVR (CLIP) ×1 IMPLANT
CLIP LIGATING EXTRA SM BLUE (MISCELLANEOUS) ×1 IMPLANT
COVER PROBE W GEL 5X96 (DRAPES) IMPLANT
DERMABOND ADVANCED .7 DNX12 (GAUZE/BANDAGES/DRESSINGS) ×1 IMPLANT
ELECT REM PT RETURN 9FT ADLT (ELECTROSURGICAL) ×1
ELECTRODE REM PT RTRN 9FT ADLT (ELECTROSURGICAL) ×1 IMPLANT
GLOVE BIO SURGEON STRL SZ7.5 (GLOVE) ×1 IMPLANT
GOWN STRL REUS W/ TWL LRG LVL3 (GOWN DISPOSABLE) ×2 IMPLANT
GOWN STRL REUS W/ TWL XL LVL3 (GOWN DISPOSABLE) ×1 IMPLANT
GOWN STRL REUS W/TWL LRG LVL3 (GOWN DISPOSABLE) ×2
GOWN STRL REUS W/TWL XL LVL3 (GOWN DISPOSABLE) ×1
INSERT FOGARTY SM (MISCELLANEOUS) IMPLANT
KIT BASIN OR (CUSTOM PROCEDURE TRAY) ×1 IMPLANT
KIT TURNOVER KIT B (KITS) ×1 IMPLANT
NS IRRIG 1000ML POUR BTL (IV SOLUTION) ×1 IMPLANT
PACK CV ACCESS (CUSTOM PROCEDURE TRAY) ×1 IMPLANT
PAD ARMBOARD 7.5X6 YLW CONV (MISCELLANEOUS) ×2 IMPLANT
POWDER SURGICEL 3.0 GRAM (HEMOSTASIS) IMPLANT
SLING ARM FOAM STRAP LRG (SOFTGOODS) IMPLANT
SLING ARM FOAM STRAP MED (SOFTGOODS) IMPLANT
SUT MNCRL AB 4-0 PS2 18 (SUTURE) ×1 IMPLANT
SUT PROLENE 6 0 BV (SUTURE) ×1 IMPLANT
SUT VIC AB 3-0 SH 27 (SUTURE) ×1
SUT VIC AB 3-0 SH 27X BRD (SUTURE) ×1 IMPLANT
TOWEL GREEN STERILE (TOWEL DISPOSABLE) ×1 IMPLANT
UNDERPAD 30X36 HEAVY ABSORB (UNDERPADS AND DIAPERS) ×1 IMPLANT
WATER STERILE IRR 1000ML POUR (IV SOLUTION) ×1 IMPLANT

## 2022-03-14 NOTE — Anesthesia Procedure Notes (Signed)
Procedure Name: MAC Date/Time: 03/14/2022 8:55 AM  Performed by: Harden Mo, CRNAPre-anesthesia Checklist: Patient identified, Emergency Drugs available, Suction available and Patient being monitored Patient Re-evaluated:Patient Re-evaluated prior to induction Oxygen Delivery Method: Simple face mask Preoxygenation: Pre-oxygenation with 100% oxygen Induction Type: IV induction Placement Confirmation: positive ETCO2 and breath sounds checked- equal and bilateral Dental Injury: Teeth and Oropharynx as per pre-operative assessment

## 2022-03-14 NOTE — Progress Notes (Addendum)
Pt states he did not take any medications today. Pt medication list sent with patient today with no last doses listed on paper. Pharmacy tech and RN attempted to call facility multiple times. Pharmacy tech to update list to match [facility]'s MAR in epic. Pt states he does not take insulin or any blood thinners. Dr Tobias Alexander notified that last dose of medications unknown. Pt MAR sent from facility states that patient takes his metoprolol at 8AM daily. Per Dr Glynda Jaeger order pt's home does of metoprolol needs to be given in pre-op.

## 2022-03-14 NOTE — Progress Notes (Signed)
Pt with sacral wound. Dressing in place. Pt states that this is changed every other day. Pt reports it is due to be changed today at the facility. Pt states he does not want his dressing removed for assessment this morning. Pt states his wound is healing. Dr Donzetta Matters notified.

## 2022-03-14 NOTE — Op Note (Signed)
    Patient name: [name] MRN: [id number] DOB: 1960/10/21 Sex: male  03/14/2022 Pre-operative Diagnosis: ESRD Post-operative diagnosis:  Same Surgeon:  Annamarie Major Assistants:  Ivin Booty, PA Procedure:   First stage right basilic vein fistula creation Anesthesia: General Blood Loss: Minimal Specimens: None  Findings: 4 mm disease-free artery, 3-4 mm basilic vein  Indications: This is a 62 year old gentleman in need of permanent access.  He comes in today for right side procedure.  Procedure:  The patient was identified in the holding area and taken to [place] [facility]  The patient was then placed supine on the table. general anesthesia was administered.  The patient was prepped and draped in the usual sterile fashion.  A time out was called and antibiotics were administered.  A PA was necessary to expedite the procedure and assist with technical details.  She help with exposure by providing suction and retraction.  She help with the anastomosis by following the suture.  She help with wound closure.  Ultrasound was used to evaluate the surface veins in the right arm.  The cephalic vein was adequate at the antecubital crease but then became very minimal in the upper arm.  The basilic vein was a better caliber vein measuring 3-4 mm.  A oblique incision was made just proximal to the antecubital crease.  I first dissected out the brachial artery.  This was a 4 mm disease-free artery.  I then identified the basilic vein.  There were several branches that required ligation.  The nerve was protected.  The vein was marked for orientation.  It was ligated distally with a 3-0 silk tie.  It distended nicely with heparin saline.  Next, the brachial artery was occluded with vascular clamps and a #11 blade was used to make an arteriotomy which was extended longitudinally with Potts scissors.  The vein was cut the appropriate length and spatulated to fit the size the arteriotomy.  A running end-to-side  anastomosis was performed with 6-0 Prolene.  Prior to completion the appropriate flushing maneuvers were performed and the anastomosis was completed.  There was a good thrill within the fistula.  The patient did not have a radial Doppler signal at baseline.  He had a excellent ulnar signal that did not change with fistula compression.  The wound was then irrigated.  Hemostasis was achieved.  The incision was closed with 2 layers of Vicryl followed by Dermabond.  There were no immediate complications.   Disposition: To PACU stable.   Theotis Burrow, M.D., [facility] [place] Office: [phone] Pager:  [phone]

## 2022-03-14 NOTE — Anesthesia Procedure Notes (Signed)
Anesthesia Regional Block: Interscalene brachial plexus block   Pre-Anesthetic Checklist: , timeout performed,  Correct Patient, Correct Site, Correct Laterality,  Correct Procedure, Correct Position, site marked,  Risks and benefits discussed,  Surgical consent,  Pre-op evaluation,  At surgeon's request and post-op pain management  Laterality: Right  Prep: chloraprep       Needles:  Injection technique: Single-shot  Needle Type: Echogenic Stimulator Needle     Needle Length: 5cm  Needle Gauge: 22     Additional Needles:   Narrative:  Start time: 03/14/2022 8:39 AM End time: 03/14/2022 8:49 AM Injection made incrementally with aspirations every 5 mL.  Performed by: Personally  Anesthesiologist: Duane Boston, MD  Additional Notes: Functioning IV was confirmed and monitors applied.  A 28mm 22ga echogenic arrow stimulator was used. Sterile prep and drape,hand hygiene and sterile gloves were used.Ultrasound guidance: relevant anatomy identified, needle position confirmed, local anesthetic spread visualized around nerve(s)., vascular puncture avoided.  Image printed for medical record.  Negative aspiration and negative test dose prior to incremental administration of local anesthetic. The patient tolerated the procedure well.

## 2022-03-14 NOTE — Discharge Instructions (Signed)
[facility]  Discharge Instructions  AV Fistula or Graft Surgery for Dialysis Access  Please refer to the following instructions for your post-procedure care. Your surgeon or physician assistant will discuss any changes with you.  Activity  You may drive the day following your surgery, if you are comfortable and no longer taking prescription pain medication. Resume full activity as the soreness in your incision resolves.  Bathing/Showering  You may shower after you go home. Keep your incision dry for 48 hours. Do not soak in a bathtub, hot tub, or swim until the incision heals completely. You may not shower if you have a hemodialysis catheter.  Incision Care  Clean your incision with mild soap and water after 48 hours. Pat the area dry with a clean towel. You do not need a bandage unless otherwise instructed. Do not apply any ointments or creams to your incision. You may have skin glue on your incision. Do not peel it off. It will come off on its own in about one week. Your arm may swell a bit after surgery. To reduce swelling use pillows to elevate your arm so it is above your heart. Your doctor will tell you if you need to lightly wrap your arm with an ACE bandage.  Diet  Resume your normal diet. There are not special food restrictions following this procedure. In order to heal from your surgery, it is CRITICAL to get adequate nutrition. Your body requires vitamins, minerals, and protein. Vegetables are the best source of vitamins and minerals. Vegetables also provide the perfect balance of protein. Processed food has little nutritional value, so try to avoid this.  Medications  Resume taking all of your medications. If your incision is causing pain, you may take over-the counter pain relievers such as acetaminophen (Tylenol). If you were prescribed a stronger pain medication, please be aware these medications can cause nausea and constipation. Prevent  nausea by taking the medication with a snack or meal. Avoid constipation by drinking plenty of fluids and eating foods with high amount of fiber, such as fruits, vegetables, and grains.  Do not take Tylenol if you are taking prescription pain medications.  Follow up Your surgeon may want to see you in the office following your access surgery. If so, this will be arranged at the time of your surgery.  Please call us immediately for any of the following conditions:  Increased pain, redness, drainage (pus) from your incision site Fever of 101 degrees or higher Severe or worsening pain at your incision site Hand pain or numbness.  Reduce your risk of vascular disease:  Stop smoking. If you would like help, call [facility] at 1-800-QUIT-NOW 5038136119) or [place] at [place] your cholesterol Maintain a desired weight Control your diabetes Keep your blood pressure down  Dialysis  It will take several weeks to several months for your new dialysis access to be ready for use. Your surgeon will determine when it is okay to use it. Your nephrologist will continue to direct your dialysis. You can continue to use your Permcath until your new access is ready for use.   03/14/2022 [name] [id number] [birth year]  Surgeon(s): Serafina Mitchell, MD  Procedure(s): RIGHT ARM ARTERIOVENOUS (AV) FISTULA VERSUS ARTERIOVENOUS GRAFT CREATION   May stick graft immediately   May stick graft on designated area only:   X Do not stick right AV fistula for 12 weeks    If you have any questions, please  call the office at [phone].

## 2022-03-14 NOTE — Transfer of Care (Signed)
Immediate Anesthesia Transfer of Care Note  Patient: KEATEN MASHEK  Procedure(s) Performed: RIGHT ARM ARTERIOVENOUS (AV) FISTULA VERSUS ARTERIOVENOUS GRAFT CREATION (Right: Arm Upper)  Patient Location: PACU  Anesthesia Type:MAC, General, and Regional  Level of Consciousness: drowsy  Airway & Oxygen Therapy: Patient Spontanous Breathing and Patient connected to face mask oxygen  Post-op Assessment: Report given to RN, Post -op Vital signs reviewed and stable, and Patient moving all extremities X 4  Post vital signs: Reviewed and stable  Last Vitals:  Vitals Value Taken Time  BP 146/121 03/14/22 1115  Temp    Pulse 80 03/14/22 1117  Resp 24 03/14/22 1117  SpO2 96 % 03/14/22 1117  Vitals shown include unvalidated device data.  Last Pain:  Vitals:   03/14/22 0840  TempSrc:   PainSc: 0-No pain         Complications: No notable events documented.

## 2022-03-14 NOTE — Interval H&P Note (Signed)
History and Physical Interval Note:  03/14/2022 7:26 AM  [name]  has presented today for surgery, with the diagnosis of ESRD.  The various methods of treatment have been discussed with the patient and family. After consideration of risks, benefits and other options for treatment, the patient has consented to  Procedure(s): RIGHT ARM ARTERIOVENOUS (AV) FISTULA VERSUS ARTERIOVENOUS GRAFT CREATION (Right) as a surgical intervention.  The patient's history has been reviewed, patient examined, no change in status, stable for surgery.  I have reviewed the patient's chart and labs.  Questions were answered to the patient's satisfaction.     Servando Snare

## 2022-03-14 NOTE — Anesthesia Postprocedure Evaluation (Signed)
Anesthesia Post Note  Patient: [name]  Procedure(s) Performed: RIGHT ARM ARTERIOVENOUS (AV) FISTULA VERSUS ARTERIOVENOUS GRAFT CREATION (Right: Arm Upper)     Patient location during evaluation: PACU Anesthesia Type: General Level of consciousness: sedated Pain management: pain level controlled Vital Signs Assessment: post-procedure vital signs reviewed and stable Respiratory status: spontaneous breathing and respiratory function stable Cardiovascular status: stable Postop Assessment: no apparent nausea or vomiting Anesthetic complications: no   No notable events documented.  Last Vitals:  Vitals:   03/14/22 1215 03/14/22 1230  BP: 130/85 130/76  Pulse: 74 77  Resp: 14 16  Temp:  36.5 C  SpO2: 96% 95%    Last Pain:  Vitals:   03/14/22 1230  TempSrc:   PainSc: 0-No pain                 Kyria Bumgardner DANIEL

## 2022-03-14 NOTE — Anesthesia Procedure Notes (Signed)
Procedure Name: LMA Insertion Date/Time: 03/14/2022 9:50 AM  Performed by: Harden Mo, CRNAPre-anesthesia Checklist: Patient identified, Emergency Drugs available, Suction available and Patient being monitored Patient Re-evaluated:Patient Re-evaluated prior to induction Oxygen Delivery Method: Circle System Utilized Preoxygenation: Pre-oxygenation with 100% oxygen Induction Type: IV induction LMA: LMA inserted LMA Size: 4.0 Number of attempts: 1 Airway Equipment and Method: Bite block Placement Confirmation: positive ETCO2 and breath sounds checked- equal and bilateral Tube secured with: Tape Dental Injury: Teeth and Oropharynx as per pre-operative assessment

## 2022-03-15 ENCOUNTER — Encounter (HOSPITAL_COMMUNITY): Payer: Self-pay | Admitting: Surgery

## 2022-03-24 ENCOUNTER — Other Ambulatory Visit (HOSPITAL_COMMUNITY): Payer: Self-pay | Admitting: Nephrology

## 2022-03-24 DIAGNOSIS — Z992 Dependence on renal dialysis: Secondary | ICD-10-CM

## 2022-03-24 DIAGNOSIS — N186 End stage renal disease: Secondary | ICD-10-CM

## 2022-03-27 ENCOUNTER — Other Ambulatory Visit: Payer: Self-pay | Admitting: Radiology

## 2022-03-28 ENCOUNTER — Ambulatory Visit (HOSPITAL_COMMUNITY)
Admission: RE | Admit: 2022-03-28 | Discharge: 2022-03-28 | Disposition: A | Discharge status: Home / Self Care | Payer: Self-pay | Source: Ambulatory Visit | Attending: Nephrology | Admitting: Nephrology

## 2022-03-28 ENCOUNTER — Encounter (HOSPITAL_COMMUNITY): Payer: Self-pay

## 2022-04-01 ENCOUNTER — Telehealth: Payer: Self-pay

## 2022-04-01 NOTE — Telephone Encounter (Signed)
Patient attempted to be outreached by Donney Rankins, PharmD Candidate on 04/01/2022 to discuss hypertension.  Phone number provided is a LTC facility.  Spoke with receptionist regarding reason for phone call and left call back number [phone].   [place] [facility] Pharm[facility]  PharmD Candidate 2024   Maryan Puls, PharmD PGY-1 [facility] Pharmacy Resident

## 2022-04-02 ENCOUNTER — Other Ambulatory Visit (HOSPITAL_COMMUNITY): Payer: Self-pay

## 2022-04-02 ENCOUNTER — Other Ambulatory Visit: Payer: Self-pay

## 2022-04-03 ENCOUNTER — Other Ambulatory Visit (HOSPITAL_COMMUNITY): Payer: Self-pay

## 2022-04-03 ENCOUNTER — Other Ambulatory Visit: Payer: Self-pay

## 2022-04-03 MED ORDER — ALBUTEROL SULFATE HFA 108 (90 BASE) MCG/ACT IN AERS
INHALATION_SPRAY | RESPIRATORY_TRACT | 0 refills | Status: DC
  Filled 2022-04-03: qty 6.7, 25d supply, fill #0

## 2022-04-03 MED ORDER — METOPROLOL SUCCINATE ER 25 MG PO TB24
ORAL_TABLET | Freq: Every day | ORAL | 0 refills | Status: DC
  Filled 2022-04-03: qty 10, 20d supply, fill #0

## 2022-04-03 MED ORDER — SEVELAMER CARBONATE 800 MG PO TABS
1600.0000 mg | ORAL_TABLET | Freq: Three times a day (TID) | ORAL | 12 refills | Status: DC
  Filled 2022-04-03: qty 540, 90d supply, fill #0

## 2022-04-03 MED ORDER — ATORVASTATIN CALCIUM 80 MG PO TABS
80.0000 mg | ORAL_TABLET | Freq: Every day | ORAL | 0 refills | Status: DC
  Filled 2022-04-03: qty 10, 10d supply, fill #0

## 2022-04-03 MED ORDER — MIDODRINE HCL 10 MG PO TABS
5.0000 mg | ORAL_TABLET | Freq: Two times a day (BID) | ORAL | 0 refills | Status: DC
  Filled 2022-04-03: qty 10, 20d supply, fill #0

## 2022-04-03 NOTE — Telephone Encounter (Signed)
Patient attempted to be outreached by Junius Finner, PharmD Candidate on 04/03/2022 to discuss hypertension. Left voicemail for patient to return our call at their convenience at [phone].   [place] [facility]  PharmD Candidate 2024   Maryan Puls, PharmD PGY-1 [facility] Pharmacy Resident

## 2022-04-04 ENCOUNTER — Other Ambulatory Visit: Payer: Self-pay

## 2022-04-07 ENCOUNTER — Telehealth: Payer: Self-pay | Admitting: *Deleted

## 2022-04-07 NOTE — Telephone Encounter (Signed)
There has not been any fax received by patient as of 04/07/2022

## 2022-04-09 ENCOUNTER — Other Ambulatory Visit: Payer: Self-pay | Admitting: *Deleted

## 2022-04-09 DIAGNOSIS — N186 End stage renal disease: Secondary | ICD-10-CM

## 2022-04-10 ENCOUNTER — Other Ambulatory Visit: Payer: Self-pay | Admitting: Internal Medicine

## 2022-04-11 ENCOUNTER — Ambulatory Visit (HOSPITAL_COMMUNITY)
Admission: RE | Admit: 2022-04-11 | Discharge: 2022-04-11 | Disposition: A | Discharge status: Home / Self Care | Payer: Medicare Other | Source: Ambulatory Visit | Attending: Nephrology | Admitting: Nephrology

## 2022-04-11 ENCOUNTER — Other Ambulatory Visit (HOSPITAL_COMMUNITY): Payer: Self-pay | Admitting: Nephrology

## 2022-04-11 DIAGNOSIS — N186 End stage renal disease: Secondary | ICD-10-CM | POA: Diagnosis not present

## 2022-04-11 DIAGNOSIS — Z8673 Personal history of transient ischemic attack (TIA), and cerebral infarction without residual deficits: Secondary | ICD-10-CM | POA: Diagnosis not present

## 2022-04-11 DIAGNOSIS — I2510 Atherosclerotic heart disease of native coronary artery without angina pectoris: Secondary | ICD-10-CM | POA: Insufficient documentation

## 2022-04-11 DIAGNOSIS — E1122 Type 2 diabetes mellitus with diabetic chronic kidney disease: Secondary | ICD-10-CM | POA: Insufficient documentation

## 2022-04-11 DIAGNOSIS — I120 Hypertensive chronic kidney disease with stage 5 chronic kidney disease or end stage renal disease: Secondary | ICD-10-CM | POA: Insufficient documentation

## 2022-04-11 DIAGNOSIS — Y841 Kidney dialysis as the cause of abnormal reaction of the patient, or of later complication, without mention of misadventure at the time of the procedure: Secondary | ICD-10-CM | POA: Insufficient documentation

## 2022-04-11 DIAGNOSIS — Z992 Dependence on renal dialysis: Secondary | ICD-10-CM | POA: Insufficient documentation

## 2022-04-11 DIAGNOSIS — Z951 Presence of aortocoronary bypass graft: Secondary | ICD-10-CM | POA: Diagnosis not present

## 2022-04-11 DIAGNOSIS — T8249XA Other complication of vascular dialysis catheter, initial encounter: Secondary | ICD-10-CM | POA: Insufficient documentation

## 2022-04-11 HISTORY — PX: IR FLUORO GUIDE CV LINE LEFT: IMG2282

## 2022-04-11 HISTORY — PX: IR VENOCAVAGRAM SVC: IMG679

## 2022-04-11 MED ORDER — IOHEXOL 300 MG/ML  SOLN
50.0000 mL | Freq: Once | INTRAMUSCULAR | Status: AC | PRN
  Administered 2022-04-11: 7 mL via INTRAVENOUS

## 2022-04-11 MED ORDER — CEFAZOLIN SODIUM-DEXTROSE 2-4 GM/100ML-% IV SOLN
INTRAVENOUS | Status: AC
  Filled 2022-04-11: qty 100

## 2022-04-11 MED ORDER — CHLORHEXIDINE GLUCONATE 4 % EX LIQD
CUTANEOUS | Status: AC
  Filled 2022-04-11: qty 15

## 2022-04-11 MED ORDER — CEFAZOLIN SODIUM-DEXTROSE 2-4 GM/100ML-% IV SOLN
2.0000 g | INTRAVENOUS | Status: AC
  Administered 2022-04-11: 2 g via INTRAVENOUS

## 2022-04-11 MED ORDER — LIDOCAINE-EPINEPHRINE 1 %-1:100000 IJ SOLN
INTRAMUSCULAR | Status: AC
  Administered 2022-04-11: 10 mL
  Filled 2022-04-11: qty 1

## 2022-04-11 MED ORDER — HEPARIN SODIUM (PORCINE) 1000 UNIT/ML IJ SOLN
INTRAMUSCULAR | Status: AC
  Administered 2022-04-11: 4.2 mL
  Filled 2022-04-11: qty 10

## 2022-04-11 NOTE — H&P (Signed)
Patient Status: [facility] - Out-pt  Assessment and Plan: Patient in need of venous access.   [name], 62 year old male, presents today to the [facility] department for an image-guided tunneled dialysis catheter exchange. This procedure will be done with local sedation.   Risks and benefits discussed with the patient including, but not limited to bleeding, infection, vascular injury, pneumothorax which may require chest tube placement, air embolism or even death  All of the patient's questions were answered, patient is agreeable to proceed. He is a full code.   Consent signed and in chart.  ______________________________________________________________________   History of Present Illness: [name] is a 62 y.o. male with a medical history significant for CAD (stent placements, CABG), DM2, HTN, DM2, obesity, stroke and end stage renal disease receiving hemodialysis via a left IJ tunneled dialysis catheter placed 12/06/21 in IR. IR has performed several dialysis catheter placements/exchanges and g-tube placement/removal.   He recently underwent right basilic vein fistula creation 03/14/22 by Dr. Trula Slade. Mr. Bian last underwent hemodialysis 2/29/23. His nephrology team has requested tunneled dialysis catheter exchange due to catheter  malfunction.  Interventional Radiology has been asked to evaluate this patient for an image-guided tunneled dialysis catheter exchange.   Allergies and medications reviewed.   Review of Systems: A 12 point ROS discussed and pertinent positives are indicated in the HPI above.  All other systems are negative.  Review of Systems  Constitutional:  Negative for fatigue.  Respiratory:  Negative for cough and shortness of breath.   Cardiovascular:  Negative for chest pain and leg swelling.  Gastrointestinal:  Negative for abdominal pain, diarrhea, nausea and vomiting.  Neurological:  Negative for dizziness and headaches.     Vital Signs: There were no vitals taken for this visit.  Physical Exam Constitutional:      General: He is not in acute distress.    Appearance: He is not ill-appearing.  Cardiovascular:     Comments: Left IJ tunneled dialysis catheter. Site is clean, dry and non-tender.  Pulmonary:     Effort: Pulmonary effort is normal.  Skin:    General: Skin is warm and dry.  Neurological:     Mental Status: He is alert and oriented to person, place, and time.      Imaging reviewed.   Labs:  COAGS: Recent Labs    10/06/21 1600 10/07/21 0408 10/07/21 1530 10/08/21 0350 10/15/21 1519 10/16/21 0407 10/21/21 0403 11/14/21 1659 11/19/21 1524 11/20/21 0009  INR  --  1.4* 1.6*   < > 1.3*   < > 1.3* 1.1 1.1 1.1  APTT 44* 46* 124*  --  35  --   --   --   --   --    < > = values in this interval not displayed.    BMP: Recent Labs    12/01/21 0012 12/02/21 0019 12/03/21 0045 12/06/21 1046 03/14/22 0720  NA 135 137 138 140 137  K 4.6 4.7 5.3* 5.7* 4.1  CL 96* 97* 97* 96* 100  CO2 '27 27 28 24  '$ --   GLUCOSE 111* 114* 116* 94 93  BUN 24* 34* 43* 39* 24*  CALCIUM 8.8* 8.9 9.0 9.4  --   CREATININE 5.51* 7.00* 8.57* 8.71* 7.40*  GFRNONAA 11* 8* 7* 6*  --        Electronically Signed: Theresa Duty, NP 04/11/2022, 2:25 PM   I spent a total of 15 minutes in face to face in clinical consultation,  greater than 50% of which was counseling/coordinating care for venous access.

## 2022-04-11 NOTE — Procedures (Signed)
Vascular and Interventional Radiology Procedure Note  Patient: NESTER WIENCEK DOB: [birth year] Medical Record Number: [id number] Note Date/Time: 04/11/22 4:03 PM   Performing Physician: Michaelle Birks, MD Assistant(s): None  Diagnosis: ESRD requiring Hemodialysis and catheter malfunction  Procedure:  TUNNELED HEMODIALYSIS CATHETER EXCHANGE CENTRAL VENOGRAM  Anesthesia: Local Anesthetic Complications: None Estimated Blood Loss: Minimal Specimens:  None  Findings:  No vengraphic evidence of a fibrin sheath Successful exchange of left-sided,  28 cm  (tip-to-cuff), tunneled hemodialysis catheter with the tip of the catheter in the proximal right atrium.  Plan: Catheter ready for use.  See detailed procedure note with images in PACS. The patient tolerated the procedure well without incident or complication and was returned to Recovery in stable condition.    Michaelle Birks, MD [facility] Interventional Radiology Special[facility] Radiology   Pager. [place]

## 2022-04-14 ENCOUNTER — Other Ambulatory Visit (HOSPITAL_COMMUNITY): Payer: Self-pay | Admitting: Nephrology

## 2022-04-14 ENCOUNTER — Encounter (HOSPITAL_COMMUNITY): Payer: Self-pay | Admitting: Radiology

## 2022-04-14 DIAGNOSIS — Z992 Dependence on renal dialysis: Secondary | ICD-10-CM

## 2022-04-18 ENCOUNTER — Other Ambulatory Visit: Payer: Self-pay | Admitting: Family Medicine

## 2022-04-18 NOTE — Telephone Encounter (Signed)
Requested medication (s) are due for refill today: yes  Requested medication (s) are on the active medication list: yes  Last refill:  03/13/22 for metoprolol and 04/03/22 for others  Future visit scheduled: yes   Notes to clinic:  not delegated for midodrine. Unable to refill per protocol due to failed labs, no updated results.      Requested Prescriptions  Pending Prescriptions Disp Refills   atorvastatin (LIPITOR) 80 MG tablet 10 tablet 0    Sig: Take 1 tablet (80 mg total) by mouth daily.     Cardiovascular:  Antilipid - Statins Failed - 04/18/2022  3:09 PM      Failed - Lipid Panel in normal range within the last 12 months    Cholesterol  Date Value Ref Range Status  03/12/2021 250 (H) 0 - 200 mg/dL Final   LDL Cholesterol  Date Value Ref Range Status  03/12/2021 UNABLE TO CALCULATE IF TRIGLYCERIDE OVER 400 mg/dL 0 - 99 mg/dL Final    Comment:           Total Cholesterol/HDL:CHD Risk Coronary Heart Disease Risk Table                     Men   Women  1/2 Average Risk   3.4   3.3  Average Risk       5.0   4.4  2 X Average Risk   9.6   7.1  3 X Average Risk  23.4   11.0        Use the calculated Patient Ratio above and the CHD Risk Table to determine the patient's CHD Risk.        ATP III CLASSIFICATION (LDL):  <100     mg/dL   Optimal  100-129  mg/dL   Near or Above                    Optimal  130-159  mg/dL   Borderline  160-189  mg/dL   High  >190     mg/dL   Very High Performed at [facility] [address] [place], [place] [address]    Direct LDL  Date Value Ref Range Status  03/12/2021 114.3 (H) 0 - 99 mg/dL Final    Comment:    Performed at [facility] [address], [place], [place] [address]   HDL  Date Value Ref Range Status  03/12/2021 31 (L) >40 mg/dL Final   Triglycerides  Date Value Ref Range Status  03/12/2021 538 (H) <150 mg/dL Final         Passed - Patient is not pregnant      Passed - Valid encounter within last  12 months    Recent Outpatient Visits           8 months ago Diabetes mellitus type 2 with neurological manifestations [facility])   [place], [place] L, RPH-CPP   9 months ago Diabetes mellitus type 2 with neurological manifestations [facility])   [place], [place] L, RPH-CPP   10 months ago Diabetes mellitus type 2 with neurological manifestations [facility])   [place], [place] L, RPH-CPP   10 months ago Uncontrolled type 2 diabetes mellitus with hypoglycemia and coma [facility])   [place] [facility], MD   11 months ago Diabetes mellitus  type 2 with neurological manifestations [facility])   [place], RPH-CPP       Future Appointments             In 1 month [name], MD [facility] Primary [facility] at [facility]             metoprolol succinate (TOPROL-XL) 25 MG 24 hr tablet      Sig: Take 0.5 tablets (12.5 mg total) by mouth daily. Takes at 0800     Cardiovascular:  Beta Blockers Failed - 04/18/2022  3:09 PM      Failed - Valid encounter within last 6 months    Recent Outpatient Visits           8 months ago Diabetes mellitus type 2 with neurological manifestations [facility])   [place], [place] L, RPH-CPP   9 months ago Diabetes mellitus type 2 with neurological manifestations [facility])   [place], [place] L, RPH-CPP   10 months ago Diabetes mellitus type 2 with neurological manifestations [facility])   [place], Annie [place] L, RPH-CPP   10 months ago Uncontrolled type 2 diabetes mellitus with hypoglycemia and coma [facility])   [place] [facility] at [facility], MD   11 months ago Diabetes mellitus  type 2 with neurological manifestations [facility])   [place], RPH-CPP       Future Appointments             In 1 month [name], MD [facility] Health Primary Care at [place] BP in normal range    BP Readings from Last 1 Encounters:  03/14/22 130/76         Passed - Last Heart Rate in normal range    Pulse Readings from Last 1 Encounters:  03/14/22 77          midodrine (PROAMATINE) 10 MG tablet 10 tablet 0    Sig: Take 0.5 tablets (5 mg total) by mouth 2 (two) times daily for blood pressure.     Not Delegated - Cardiovascular: Midodrine Failed - 04/18/2022  3:09 PM      Failed - This refill cannot be delegated      Failed - Cr in normal range and within 360 days    Creatinine, Ser  Date Value Ref Range Status  03/14/2022 7.40 (H) 0.61 - 1.24 mg/dL Final         Passed - ALT in normal range and within 360 days    ALT  Date Value Ref Range Status  10/22/2021 22 0 - 44 U/L Final         Passed - AST in normal range and within 360 days    AST  Date Value Ref Range Status  10/22/2021 38 15 - 41 U/L Final         Passed - Last BP in normal range    BP Readings from Last 1 Encounters:  03/14/22 130/76         Passed - Valid encounter within last 12 months    Recent Outpatient Visits           8 months ago Diabetes mellitus type 2 with neurological manifestations ([facility])  [place], Annie [place] L, RPH-CPP   9 months ago Diabetes mellitus type 2 with neurological manifestations [facility])   [place], Annie Main L, RPH-CPP   10 months ago Diabetes mellitus type 2 with neurological manifestations [facility])   [place], Annie Main L, RPH-CPP   10 months ago Uncontrolled type 2 diabetes mellitus with hypoglycemia and coma [facility])   [place] [facility] at [facility], MD   11 months ago Diabetes mellitus type 2 with neurological manifestations [facility])   [place], [place], RPH-CPP       Future Appointments             In 1 month [name], MD [facility] Health Primary Care at [facility]

## 2022-04-18 NOTE — Telephone Encounter (Unsigned)
Copied from [place] [id number]. Topic: General - Other >> Apr 18, 2022  2:20 PM Everette C wrote: Reason for CRM: Medication Refill - Medication: midodrine (PROAMATINE) 10 MG tablet [id number]  Rx #: [id number]  atorvastatin (LIPITOR) 80 MG tablet [id number]    metoprolol succinate (TOPROL-XL) 25 MG 24 hr tablet [id number]  Has the patient contacted their pharmacy? Yes.   (Agent: If no, request that the patient contact the pharmacy for the refill. If patient does not wish to contact the pharmacy document the reason why and proceed with request.) (Agent: If yes, when and what did the pharmacy advise?)  Preferred Pharmacy (with phone number or street name): [facility] [address], [place] [address] Phone: [phone] Fax: [phone] Hours: M-F 7:30a-6:00p   Has the patient been seen for an appointment in the last year OR does the patient have an upcoming appointment? Yes.    Agent: Please be advised that RX refills may take up to 3 business days. We ask that you follow-up with your pharmacy.

## 2022-04-21 ENCOUNTER — Other Ambulatory Visit: Payer: Self-pay

## 2022-04-21 ENCOUNTER — Ambulatory Visit (HOSPITAL_COMMUNITY): Admission: RE | Admit: 2022-04-21 | Payer: Self-pay | Source: Ambulatory Visit

## 2022-04-21 NOTE — Telephone Encounter (Signed)
Patient is calling because he says he called on Friday about getting his medications refilled and pt hasn't heard anything. Pt would like to know if he will able to get his prescriptions prior to his appt because he needs the medicine. Please advise.

## 2022-04-21 NOTE — Telephone Encounter (Signed)
Pt says he is almost out of the medication for his blood pressure.

## 2022-04-22 ENCOUNTER — Ambulatory Visit: Payer: Self-pay | Admitting: Family Medicine

## 2022-04-24 ENCOUNTER — Other Ambulatory Visit: Payer: Self-pay

## 2022-04-25 ENCOUNTER — Other Ambulatory Visit: Payer: Self-pay

## 2022-04-25 NOTE — Telephone Encounter (Signed)
Pt. Reports pharmacy states they only received atorvastatin. Did not get metoprolol or Midodrine. Please advise. Pt. Completely out of medications.

## 2022-04-28 ENCOUNTER — Other Ambulatory Visit: Payer: Self-pay

## 2022-05-01 ENCOUNTER — Other Ambulatory Visit: Payer: Self-pay

## 2022-05-05 ENCOUNTER — Other Ambulatory Visit: Payer: Self-pay

## 2022-05-09 ENCOUNTER — Other Ambulatory Visit (HOSPITAL_COMMUNITY): Payer: Self-pay

## 2022-05-09 MED ORDER — METOPROLOL SUCCINATE ER 25 MG PO TB24
12.5000 mg | ORAL_TABLET | Freq: Every day | ORAL | 6 refills | Status: DC
  Filled 2022-05-09: qty 15, 30d supply, fill #0

## 2022-05-12 ENCOUNTER — Other Ambulatory Visit: Payer: Self-pay

## 2022-05-14 ENCOUNTER — Other Ambulatory Visit (HOSPITAL_COMMUNITY): Payer: Self-pay

## 2022-05-15 ENCOUNTER — Observation Stay (HOSPITAL_COMMUNITY)
Admission: EM | Admit: 2022-05-15 | Discharge: 2022-05-16 | Disposition: A | Discharge status: Home / Self Care | Payer: Medicare Other | Attending: Internal Medicine | Admitting: Internal Medicine

## 2022-05-15 ENCOUNTER — Encounter (HOSPITAL_COMMUNITY): Payer: Self-pay

## 2022-05-15 ENCOUNTER — Emergency Department (HOSPITAL_COMMUNITY): Payer: Medicare Other

## 2022-05-15 ENCOUNTER — Other Ambulatory Visit: Payer: Self-pay

## 2022-05-15 DIAGNOSIS — I120 Hypertensive chronic kidney disease with stage 5 chronic kidney disease or end stage renal disease: Secondary | ICD-10-CM | POA: Diagnosis not present

## 2022-05-15 DIAGNOSIS — E1169 Type 2 diabetes mellitus with other specified complication: Secondary | ICD-10-CM | POA: Diagnosis not present

## 2022-05-15 DIAGNOSIS — E785 Hyperlipidemia, unspecified: Secondary | ICD-10-CM

## 2022-05-15 DIAGNOSIS — E1149 Type 2 diabetes mellitus with other diabetic neurological complication: Secondary | ICD-10-CM | POA: Diagnosis present

## 2022-05-15 DIAGNOSIS — Z7982 Long term (current) use of aspirin: Secondary | ICD-10-CM | POA: Diagnosis not present

## 2022-05-15 DIAGNOSIS — Z8673 Personal history of transient ischemic attack (TIA), and cerebral infarction without residual deficits: Secondary | ICD-10-CM | POA: Insufficient documentation

## 2022-05-15 DIAGNOSIS — I4892 Unspecified atrial flutter: Secondary | ICD-10-CM | POA: Diagnosis not present

## 2022-05-15 DIAGNOSIS — I2510 Atherosclerotic heart disease of native coronary artery without angina pectoris: Secondary | ICD-10-CM | POA: Insufficient documentation

## 2022-05-15 DIAGNOSIS — E1159 Type 2 diabetes mellitus with other circulatory complications: Secondary | ICD-10-CM

## 2022-05-15 DIAGNOSIS — I480 Paroxysmal atrial fibrillation: Secondary | ICD-10-CM | POA: Insufficient documentation

## 2022-05-15 DIAGNOSIS — Z79899 Other long term (current) drug therapy: Secondary | ICD-10-CM | POA: Diagnosis not present

## 2022-05-15 DIAGNOSIS — Z794 Long term (current) use of insulin: Secondary | ICD-10-CM | POA: Diagnosis not present

## 2022-05-15 DIAGNOSIS — R079 Chest pain, unspecified: Secondary | ICD-10-CM

## 2022-05-15 DIAGNOSIS — Z87891 Personal history of nicotine dependence: Secondary | ICD-10-CM | POA: Insufficient documentation

## 2022-05-15 DIAGNOSIS — Z951 Presence of aortocoronary bypass graft: Secondary | ICD-10-CM | POA: Diagnosis not present

## 2022-05-15 DIAGNOSIS — R0602 Shortness of breath: Secondary | ICD-10-CM | POA: Diagnosis present

## 2022-05-15 DIAGNOSIS — Z992 Dependence on renal dialysis: Secondary | ICD-10-CM

## 2022-05-15 DIAGNOSIS — I152 Hypertension secondary to endocrine disorders: Secondary | ICD-10-CM

## 2022-05-15 DIAGNOSIS — N186 End stage renal disease: Secondary | ICD-10-CM | POA: Diagnosis not present

## 2022-05-15 DIAGNOSIS — I10 Essential (primary) hypertension: Secondary | ICD-10-CM | POA: Diagnosis present

## 2022-05-15 LAB — I-STAT CHEM 8, ED
BUN: 28 mg/dL — ABNORMAL HIGH (ref 8–23)
Calcium, Ion: 1.06 mmol/L — ABNORMAL LOW (ref 1.15–1.40)
Chloride: 100 mmol/L (ref 98–111)
Creatinine, Ser: 7.4 mg/dL — ABNORMAL HIGH (ref 0.61–1.24)
Glucose, Bld: 87 mg/dL (ref 70–99)
HCT: 45 % (ref 39.0–52.0)
Hemoglobin: 15.3 g/dL (ref 13.0–17.0)
Potassium: 4.4 mmol/L (ref 3.5–5.1)
Sodium: 135 mmol/L (ref 135–145)
TCO2: 26 mmol/L (ref 22–32)

## 2022-05-15 LAB — COMPREHENSIVE METABOLIC PANEL
ALT: 11 U/L (ref 0–44)
AST: 20 U/L (ref 15–41)
Albumin: 4.4 g/dL (ref 3.5–5.0)
Alkaline Phosphatase: 129 U/L — ABNORMAL HIGH (ref 38–126)
Anion gap: 16 — ABNORMAL HIGH (ref 5–15)
BUN: 27 mg/dL — ABNORMAL HIGH (ref 8–23)
CO2: 23 mmol/L (ref 22–32)
Calcium: 9.5 mg/dL (ref 8.9–10.3)
Chloride: 95 mmol/L — ABNORMAL LOW (ref 98–111)
Creatinine, Ser: 6.29 mg/dL — ABNORMAL HIGH (ref 0.61–1.24)
GFR, Estimated: 9 mL/min — ABNORMAL LOW (ref >60)
Glucose, Bld: 87 mg/dL (ref 70–99)
Potassium: 4.4 mmol/L (ref 3.5–5.1)
Sodium: 134 mmol/L — ABNORMAL LOW (ref 135–145)
Total Bilirubin: 1.1 mg/dL (ref 0.3–1.2)
Total Protein: 8.2 g/dL — ABNORMAL HIGH (ref 6.5–8.1)

## 2022-05-15 LAB — CBC WITH DIFFERENTIAL/PLATELET
Abs Immature Granulocytes: 0.1 10*3/uL — ABNORMAL HIGH (ref 0.00–0.07)
Basophils Absolute: 0 10*3/uL (ref 0.0–0.1)
Basophils Relative: 1 %
Eosinophils Absolute: 0.2 10*3/uL (ref 0.0–0.5)
Eosinophils Relative: 3 %
HCT: 42.7 % (ref 39.0–52.0)
Hemoglobin: 13.4 g/dL (ref 13.0–17.0)
Immature Granulocytes: 1 %
Lymphocytes Relative: 25 %
Lymphs Abs: 2 10*3/uL (ref 0.7–4.0)
MCH: 30.2 pg (ref 26.0–34.0)
MCHC: 31.4 g/dL (ref 30.0–36.0)
MCV: 96.4 fL (ref 80.0–100.0)
Monocytes Absolute: 0.6 10*3/uL (ref 0.1–1.0)
Monocytes Relative: 7 %
Neutro Abs: 5 10*3/uL (ref 1.7–7.7)
Neutrophils Relative %: 63 %
Platelets: 169 10*3/uL (ref 150–400)
RBC: 4.43 MIL/uL (ref 4.22–5.81)
RDW: 17.1 % — ABNORMAL HIGH (ref 11.5–15.5)
WBC: 8 10*3/uL (ref 4.0–10.5)
nRBC: 0 % (ref 0.0–0.2)

## 2022-05-15 LAB — CBG MONITORING, ED: Glucose-Capillary: 86 mg/dL (ref 70–99)

## 2022-05-15 LAB — TROPONIN I (HIGH SENSITIVITY)
Troponin I (High Sensitivity): 39 ng/L — ABNORMAL HIGH (ref <18)
Troponin I (High Sensitivity): 37 ng/L — ABNORMAL HIGH (ref <18)

## 2022-05-15 LAB — BRAIN NATRIURETIC PEPTIDE: B Natriuretic Peptide: 541.5 pg/mL — ABNORMAL HIGH (ref 0.0–100.0)

## 2022-05-15 LAB — MAGNESIUM: Magnesium: 2 mg/dL (ref 1.7–2.4)

## 2022-05-15 MED ORDER — METOPROLOL SUCCINATE ER 25 MG PO TB24
25.0000 mg | ORAL_TABLET | Freq: Once | ORAL | Status: AC
  Administered 2022-05-15: 25 mg via ORAL
  Filled 2022-05-15: qty 1

## 2022-05-15 MED ORDER — AMIODARONE HCL IN DEXTROSE 360-4.14 MG/200ML-% IV SOLN
30.0000 mg/h | INTRAVENOUS | Status: DC
  Filled 2022-05-15: qty 200

## 2022-05-15 MED ORDER — AMIODARONE HCL 200 MG PO TABS
400.0000 mg | ORAL_TABLET | Freq: Two times a day (BID) | ORAL | Status: DC
  Administered 2022-05-15 – 2022-05-16 (×2): 400 mg via ORAL
  Filled 2022-05-15 (×2): qty 2

## 2022-05-15 MED ORDER — APIXABAN 5 MG PO TABS
5.0000 mg | ORAL_TABLET | Freq: Two times a day (BID) | ORAL | Status: DC
  Administered 2022-05-15 – 2022-05-16 (×2): 5 mg via ORAL
  Filled 2022-05-15 (×2): qty 1

## 2022-05-15 MED ORDER — AMIODARONE LOAD VIA INFUSION
150.0000 mg | Freq: Once | INTRAVENOUS | Status: DC
  Filled 2022-05-15: qty 83.34

## 2022-05-15 MED ORDER — ATORVASTATIN CALCIUM 80 MG PO TABS
80.0000 mg | ORAL_TABLET | Freq: Every day | ORAL | Status: DC
  Administered 2022-05-16: 80 mg via ORAL
  Filled 2022-05-15: qty 1

## 2022-05-15 MED ORDER — ASPIRIN 81 MG PO CHEW
324.0000 mg | CHEWABLE_TABLET | Freq: Once | ORAL | Status: AC
  Administered 2022-05-15: 324 mg via ORAL
  Filled 2022-05-15: qty 4

## 2022-05-15 MED ORDER — METOPROLOL TARTRATE 25 MG PO TABS: 25.0000 mg | ORAL_TABLET | Freq: Once | ORAL | Status: DC

## 2022-05-15 MED ORDER — AMIODARONE HCL IN DEXTROSE 360-4.14 MG/200ML-% IV SOLN
60.0000 mg/h | INTRAVENOUS | Status: DC
  Filled 2022-05-15: qty 200

## 2022-05-15 MED ORDER — ATORVASTATIN CALCIUM 40 MG PO TABS: 40.0000 mg | ORAL_TABLET | Freq: Every day | ORAL | Status: DC

## 2022-05-15 MED ORDER — METOPROLOL SUCCINATE ER 25 MG PO TB24
25.0000 mg | ORAL_TABLET | Freq: Every day | ORAL | Status: DC
  Administered 2022-05-16: 25 mg via ORAL
  Filled 2022-05-15: qty 1

## 2022-05-15 NOTE — Consult Note (Signed)
Cardiology Consultation   Patient ID: [name] MRN: [id number]; DOB: 01-Nov-1960  Admit date: 05/15/2022 Date of Consult: 05/15/2022  PCP:  [name], MD   [facility] [facility] Providers Cardiologist:  Freada Bergeron, MD     Patient Profile:   [name] is a 62 y.o. male with a hx of ESRD on HD, CAD s/p CABG 09/2021, paroxysmal atrial fib/flutter, type 2 DM, history of CVA, history of PEA arrest in the setting of cardiac tamponade 09/2021, HTN, HLD, alcohol abuse, tobacco use who is being seen 05/15/2022 for the evaluation of tachycardia at the request of Dr. Nechama Guard.  History of Present Illness:   [name] is a 62 year old male with above medical history who is followed by Dr. Johney Frame. Per chart review, patient has an extensive history of Coronary artery disease and previously had stents placed to the LAD, high marginal, and distal circumflex. He ultimately had to undergo CABG x5 on 09/12/21 with LIMA-LAD, SVG-Diag, SVG-OM1-OM2, SVG-dRCA. He did have postoperative atrial fibrillation, and he was treated with carvedilol and eliquis. Patient presented to the hospital on 09/28/21 complaining of lethargy and dyspnea on exertion. Echocardiogram on 09/28/21 showed EF 50%, grade II diastolic dysfunction, moderate left-sided pericardial effusion with evidence of possible tamponade. He underwent surgical evacuation of pericardial hematoma on 8/19. After the procedure, patient had recurrent atrial fibrillation and was treated with IV amiodarone. He developed bradycardia and PEA arrest. Patient was intubated, and bedside echocardiogram showed a compressed LV with large posterior effusion/clot. He was intubated and arrested a second time. Dr. Kipp Brood performed emergent bedside sternotomy with removal of a large amount of clot. Later that day, patient had a third recurrent cardiac arrest. The chest was unpacked, pericardium was suctioned, and Dr. Haroldine Laws performed cardiac massage. Patient was  taken emergently to the OR for washout and VA ECMO support. Again required OR washout on 09/30/21. On 10/01/21, patient developed afib and was treated with IV amiodarone. Repeat washout on 10/03/21, and had an impella placed on 8/28, ECMO was discontinued. On 8/29, he had a PEA arrest, and later had elevated HR in atrial flutter despite multiple boluses of amiodarone. He underwent DCCV with conversion to NSR. Had his chest closed on 9/2 and impella was extracted on 9/7. He had a percutaneous tracheostomy placed on 9/7 as well. Patient was transitioned to [facility] on 9/21, and had a tunneled HD cath placed on 9/25. He was eventually discharged on 12/03/21.  Most recent echocardiogram was a limited echo on 10/23/21 that showed LVEF 65-70%, no regional wall motion abnormalities, grade I diastolic dysfunction, mildly reduced RV systolic function. He has not been seen by cardiology since he was discharged from the hospital.   Patient presented to the ED from dialysis with tachycardia. Per EMS, the dialysis center noticed that patient's HR had jumped to the 140s. Patient complained of intermittent chest pain and SOB. In the ED, EKG showed atrial flutter with HR 143 BPM. Labs showed Na 135, K 4.4, creatinine 7.4, WBC 8, hemoglobin 13.4, platelets 169. CXR showed a hazy retrocardiac opacity, favored to reflect atelectasis.  Patient converted to NSR with HR in the 100s after receiving PO metoprolol. Has not had chest pain since converting.   Past Medical History:  Diagnosis Date   AKI (acute kidney injury)    pt unaware of this   Anginal pain    CAD (coronary artery disease)    a. 03/2015 NSTEMI: LHC with severe 3V CAD  (70%  mid RCA, 95% OM1, 90% distal LCx, 90% OM3, 80% prox LAD and 90% ost D1) s/p DES to mLAD w/ small dissction Rx with DES, staged ost Ramus PCI/DES and dLCx s/p PCI/DES    Chest pain 12/24/2020   Diabetes mellitus type 2 in obese    Diverticulosis    Dyspnea    Dyspnea on exertion 03/16/2015    Dyspnea on exertion   ESRD (end stage renal disease) on dialysis    Family history of adverse reaction to anesthesia    patient father- pt states after anesthesia his father "developed dementia"   GERD (gastroesophageal reflux disease)    Hypercholesteremia    Hypertension associated with diabetes 03/16/2015   hypertension   NSTEMI (non-ST elevated myocardial infarction) 03/17/2015   Obesity    Stroke 2022   pt states he had a "mini stroke" during cardiac catheterization   Tobacco abuse     Past Surgical History:  Procedure Laterality Date   APPLICATION OF WOUND VAC N/A 10/03/2021   Procedure: APPLICATION OF WOUND VAC;  Surgeon: Dahlia Byes, MD;  Location: [place];  Service: Thoracic;  Laterality: N/A;   AV FISTULA PLACEMENT Right 03/14/2022   Procedure: RIGHT ARM ARTERIOVENOUS (AV) FISTULA VERSUS ARTERIOVENOUS GRAFT CREATION;  Surgeon: Serafina Mitchell, MD;  [facility];  Service: Vascular;  Laterality: Right;   CANNULATION FOR ECMO (EXTRACORPOREAL MEMBRANE OXYGENATION) N/A 09/29/2021   Procedure: CANNULATION FOR ECMO (EXTRACORPOREAL MEMBRANE OXYGENATION);  Surgeon: Lajuana Matte, MD;  Location: [place];  Service: Open Heart Surgery;  Laterality: N/A;   CARDIAC CATHETERIZATION N/A 03/19/2015   Procedure: Left Heart Cath and Coronary Angiography;  Surgeon: Lorretta Harp, MD;  Location: [place] CV [facility];  Service: Cardiovascular;  Laterality: N/A;   CARDIAC CATHETERIZATION N/A 03/20/2015   Procedure: Coronary Stent Intervention;  Surgeon: Lorretta Harp, MD;  Location: [place] [facility];  Service: Cardiovascular;  Laterality: N/A;   CARDIAC CATHETERIZATION N/A 03/22/2015   Procedure: Coronary Stent Intervention;  Surgeon: Lorretta Harp, MD;  Location: [place] [facility];  Service: Cardiovascular;  Laterality: N/A;   CORONARY ARTERY BYPASS GRAFT N/A 09/12/2021   Procedure: CORONARY ARTERY BYPASS GRAFTING (CABG) X 5 USING LEFT INTERNAL MAMMARY ARTERY AND ENDOSCOPICALLY  HARVESTED RIGHT GREATER SAPHENOUS VEIN.;  Surgeon: [name], MD;  Location: [place];  Service: Open Heart Surgery;  Laterality: N/A;   EXPLORATION POST OPERATIVE OPEN HEART N/A 09/30/2021   Procedure: EXPLORATION POST OPERATIVE OPEN HEART WASHOUT;  Surgeon: Lajuana Matte, MD;  Location: [place];  Service: Open Heart Surgery;  Laterality: N/A;   IR FLUORO GUIDE CV LINE LEFT  11/04/2021   IR FLUORO GUIDE CV LINE LEFT  12/06/2021   IR FLUORO GUIDE CV LINE LEFT  04/11/2022   IR GASTROSTOMY TUBE MOD SED  11/15/2021   IR GASTROSTOMY TUBE MOD SED  11/20/2021   IR GASTROSTOMY TUBE REMOVAL  01/20/2022   IR US GUIDE VASC ACCESS LEFT  11/04/2021   IR VENOCAVAGRAM SVC  12/06/2021   IR VENOCAVAGRAM SVC  04/11/2022   LEFT HEART CATH AND CORONARY ANGIOGRAPHY N/A 12/25/2020   Procedure: LEFT HEART CATH AND CORONARY ANGIOGRAPHY;  Surgeon: Troy Sine, MD;  Location: [place] [facility];  Service: Cardiovascular;  Laterality: N/A;   LEFT HEART CATH AND CORONARY ANGIOGRAPHY N/A 12/26/2020   Procedure: LEFT HEART CATH AND CORONARY ANGIOGRAPHY;  Surgeon: Troy Sine, MD;  Location: [place] [facility];  Service: Cardiovascular;  Laterality: N/A;  MEDIASTINAL EXPLORATION N/A 10/03/2021   Procedure: MEDIASTINAL WASHOUT;  Surgeon: Dahlia Byes, MD;  Location: [place];  Service: Thoracic;  Laterality: N/A;  PUMP STANDBY   MEDIASTINAL EXPLORATION N/A 10/12/2021   Procedure: MEDIASTINAL WASHOUT;  Surgeon: [name], MD;  Location: [place];  Service: Thoracic;  Laterality: N/A;   PERCUTANEOUS TRACHEOSTOMY N/A 10/17/2021   Procedure: PERCUTANEOUS TRACHEOSTOMY USING SHILEY FLEXIBLE 8 mm CUFFED [place].;  Surgeon: Candee Furbish, MD;  Location: [place];  Service: Pulmonary;  Laterality: N/A;  Percutaneous tracheostomy   PERICARDIAL WINDOW Right 09/28/2021   Procedure: PERICARDIAL WINDOW;  Surgeon: Lajuana Matte, MD;  Location: [place];  Service: Thoracic;  Laterality: Right;  Right VATS.  Lazy lateral.  double  lumen ET tube   PLACEMENT OF IMPELLA LEFT VENTRICULAR ASSIST DEVICE N/A 10/07/2021   Procedure: INSERTION OF RIGHT AXILLARY IMPELLA, DECANNULATION OF ECMO, AND MEDIASTINAL WASHOUT;  Surgeon: [name], MD;  Location: [place];  Service: Open Heart Surgery;  Laterality: N/A;  Open right axillary with 8 mm Hemashield Platinum Vascular graft.   RADIOLOGY WITH ANESTHESIA N/A 11/20/2021   Procedure: G-Tube Placement;  Surgeon: Sandi Mariscal, MD;  Location: [place];  Service: Radiology;  Laterality: N/A;   REMOVAL OF IMPELLA LEFT VENTRICULAR ASSIST DEVICE Right 10/17/2021   Procedure: REMOVAL OF IMPELLA LEFT VENTRICULAR ASSIST DEVICE;  Surgeon: [name], MD;  Location: [place];  Service: Open Heart Surgery;  Laterality: Right;   STERNAL CLOSURE N/A 10/12/2021   Procedure: STERNAL CLOSURE;  Surgeon: [name], MD;  [facility];  Service: Thoracic;  Laterality: N/A;   TEE WITHOUT CARDIOVERSION N/A 09/12/2021   Procedure: TRANSESOPHAGEAL ECHOCARDIOGRAM (TEE);  Surgeon: [name], MD;  Location: [place];  Service: Open Heart Surgery;  Laterality: N/A;   TEE WITHOUT CARDIOVERSION N/A 09/29/2021   Procedure: TRANSESOPHAGEAL ECHOCARDIOGRAM (TEE);  Surgeon: Lajuana Matte, MD;  Location: [place];  Service: Open Heart Surgery;  Laterality: N/A;   TEE WITHOUT CARDIOVERSION N/A 10/03/2021   Procedure: TRANSESOPHAGEAL ECHOCARDIOGRAM (TEE);  Surgeon: Dahlia Byes, MD;  Location: [place];  Service: Thoracic;  Laterality: N/A;   TEE WITHOUT CARDIOVERSION  10/07/2021   Procedure: TRANSESOPHAGEAL ECHOCARDIOGRAM (TEE);  Surgeon: [name], MD;  Location: [facility];  Service: Open Heart Surgery;;   TEE WITHOUT CARDIOVERSION N/A 10/12/2021   Procedure: TRANSESOPHAGEAL ECHOCARDIOGRAM (TEE);  Surgeon: [name], MD;  Location: [facility] OR;  Service: Thoracic;  Laterality: N/A;   TEE WITHOUT CARDIOVERSION N/A 10/17/2021   Procedure: TRANSESOPHAGEAL ECHOCARDIOGRAM (TEE);  Surgeon: [name], MD;  Location: [place];  Service: Open Heart Surgery;  Laterality: N/A;   TESTICLE SURGERY  1970   pt states testicle was ascended and had to be "pulled down"   TONSILLECTOMY     as a child     Home Medications:  Prior to Admission medications   Medication Sig Start Date End Date Taking? Authorizing Provider  acetaminophen (TYLENOL) 325 MG tablet Take 2 tablets (650 mg total) by mouth every 6 (six) hours as needed for mild pain, headache or fever. 12/03/21   Barrett, Erin R, PA-C  albuterol (VENTOLIN HFA) 108 (90 Base) MCG/ACT inhaler Inhale 1-2 puffs into the lungs every 4 (four) hours as needed for wheezing or shortness of breath.    [provider]  albuterol (VENTOLIN HFA) 108 (90 Base) MCG/ACT inhaler Inhale 2 puffs three times daily as needed for shortness of breath 04/03/22     [place]  Acids-Protein Hydrolys (FEEDING SUPPLEMENT, PRO-STAT 64,) LIQD Take 30 mLs by mouth 2 (two) times daily.    [provider]  ascorbic acid (VITAMIN C) 500 MG tablet Take 500 mg by mouth daily.    [provider]  aspirin 81 MG chewable tablet Chew 1 tablet (81 mg total) by mouth daily. 12/03/21   Barrett, Erin R, PA-C  atorvastatin (LIPITOR) 80 MG tablet Take 1 tablet (80 mg total) by mouth daily. Patient taking differently: Take 40 mg by mouth daily. 09/06/21   Charlott Rakes, MD  atorvastatin (LIPITOR) 80 MG tablet Take 1 tablet (80 mg total) by mouth daily. 04/03/22     Continuous Blood Gluc Sensor (FREESTYLE LIBRE 2 SENSOR) MISC Utilize as directed q 14 days to monitor blood sugar. 05/10/21   Charlott Rakes, MD  folic acid (FOLVITE) 1 MG tablet Take 1 tablet (1 mg total) by mouth daily. 12/03/21   Barrett, Erin R, PA-C  glucose blood (TRUE METRIX BLOOD GLUCOSE TEST) test strip Use to check blood sugar three times daily. 06/10/21   Charlott Rakes, MD  guaifenesin (ROBITUSSIN) 100 MG/5ML syrup Take 200 mg by mouth every 4 (four) hours as needed for cough.    [provider]  insulin aspart  (NOVOLOG) 100 UNIT/ML injection Inject 0-6 Units into the skin every 4 (four) hours. CBG 70-150 ( 0 units) 151-200 (1 unit) 201-250 (2 units) 251-300 ( 3 units) 301-350 ( 4 units) 351-400 (5 units) >400 ( 6 units) 12/03/21   Barrett, Erin R, PA-C  Insulin Pen Needle (TRUEPLUS 5-BEVEL PEN NEEDLES) 32G X 4 MM MISC Use to inject Basaglar once daily. 05/10/21   Charlott Rakes, MD  leptospermum manuka honey (MEDIHONEY) PSTE paste Apply 1 Application topically daily. Apply Medihoney to sacrum/bilat buttock wound Q day, then cover with gauze and abd pad and tape  Apply thin layer (3 mm) to wound. 12/03/21   Barrett, Erin R, PA-C  metoprolol succinate (TOPROL XL) 25 MG 24 hr tablet Take 0.5 tablet by mouth daily for Afib. 04/03/22     metoprolol succinate (TOPROL-XL) 25 MG 24 hr tablet Take 25 mg by mouth daily.    [provider]  metoprolol succinate (TOPROL-XL) 25 MG 24 hr tablet Take 12.5 mg by mouth daily. Takes at 0800    [provider]  metoprolol succinate (TOPROL-XL) 25 MG 24 hr tablet Take 1/2 tablet (12.5 mg total) by mouth at bedtime. 05/09/22     midodrine (PROAMATINE) 10 MG tablet Take 1 tablet (10 mg total) by mouth 3 (three) times daily with meals. Patient taking differently: Take 5 mg by mouth 2 (two) times daily. Hold if SBP > 130 12/03/21   Barrett, Erin R, PA-C  midodrine (PROAMATINE) 10 MG tablet Take 0.5 tablets (5 mg total) by mouth 2 (two) times daily for blood pressure. 04/03/22     multivitamin (RENA-VIT) TABS tablet Take 1 tablet by mouth at bedtime. 12/03/21   Barrett, Erin R, PA-C  Nutritional Supplements (FEEDING SUPPLEMENT, OSMOLITE 1.5 CAL,) LIQD Place 237 mLs into feeding tube 4 (four) times daily. Bolus a full carton of Osmolite 1.5 (237 ml) formula via GRAVITY per PEG using a 50-60 cc syringe. Flush PEG with 20 ml before and after each bolus.  Pt is willing to take bolus tube feeds. He would prefer not to be asked each time whether he wants the bolus  feed but instead would like it to be administered at the scheduled time. 12/03/21   Barrett, Lodema Hong,  PA-C  oxyCODONE-acetaminophen (PERCOCET) 5-325 MG tablet Take 1 tablet by mouth every 6 (six) hours as needed for severe pain. 03/14/22 03/14/23  Baglia, Corrina, PA-C  pantoprazole (PROTONIX) 40 MG tablet Take 1 tablet (40 mg total) by mouth 2 (two) times daily. Patient taking differently: Take 40 mg by mouth daily. 12/03/21   Barrett, Erin R, PA-C  polyvinyl alcohol (LIQUIFILM TEARS) 1.4 % ophthalmic solution Place 2 drops into both eyes as needed for dry eyes. 12/03/21   Barrett, Erin R, PA-C  sevelamer carbonate (RENVELA) 800 MG tablet Take 2 tablets (1,600 mg total) by mouth 3 (three) times daily with meals. 04/03/22     thiamine (VITAMIN B-1) 100 MG tablet Take 1 tablet (100 mg total) by mouth daily. 12/03/21   Barrett, Erin R, PA-C  TRUEplus Lancets 28G MISC Use to check blood sugar three times daily. 06/10/21   Charlott Rakes, MD  zinc gluconate 50 MG tablet Take 50 mg by mouth daily.    [provider]    Inpatient Medications: Scheduled Meds:  amiodarone  400 mg Oral BID   apixaban  5 mg Oral BID   Continuous Infusions:   PRN Meds:   Allergies:   No Known Allergies  Social History:   Social History   Socioeconomic History   Marital status: Legally Separated    Spouse name: Not on file   Number of children: 0   Years of education: Not on file   Highest education level: Not on file  Occupational History   Not on file  Tobacco Use   Smoking status: Former    Packs/day: .5    Types: Cigarettes    Quit date: 2017    Years since quitting: 7.2   Smokeless tobacco: Never  Vaping Use   Vaping Use: Former   Start date: 10/14/2017   Quit date: 09/09/2021  Substance and Sexual Activity   Alcohol use: Not Currently    Comment: very occasional, maybe a beer or mixed drink once every few months   Drug use: Not Currently   Sexual activity: Not on file  Other Topics Concern    Not on file  Social History Narrative   Not on file   Social Determinants of Health   Financial Resource Strain: Low Risk  (12/06/2021)   Overall Financial Resource Strain (CARDIA)    Difficulty of Paying Living Expenses: Not hard at all  Food Insecurity: No Food Insecurity (12/06/2021)   Hunger Vital Sign    Worried About Running Out of Food in the Last Year: Never true    Ran Out of Food in the Last Year: Never true  Transportation Needs: No Transportation Needs (12/06/2021)   PRAPARE - Hydrologist (Medical): No    Lack of Transportation (Non-Medical): No  Physical Activity: Not on file  Stress: Not on file  Social Connections: Not on file  Intimate Partner Violence: Not At Risk (12/06/2021)   Humiliation, Afraid, Rape, and Kick questionnaire    Fear of Current or Ex-Partner: No    Emotionally Abused: No    Physically Abused: No    Sexually Abused: No    Family History:   History reviewed. No pertinent family history.   ROS:  Please see the history of present illness.   All other ROS reviewed and negative.     Physical Exam/Data:   Vitals:   05/15/22 1700 05/15/22 1715 05/15/22 1730 05/15/22 1800  BP: 137/80 (!) 115/54 126/72  Pulse: 99 100 99   Resp: (!) 27 (!) 9 16 (!) 27  SpO2: 98% 100% 96%    No intake or output data in the 24 hours ending 05/15/22 1840    03/14/2022    5:57 AM 02/12/2022    2:13 PM 12/25/2021    1:43 PM  Last 3 Weights  Weight (lbs) 173 lb 185 lb 185 lb  Weight (kg) 78.472 kg 83.915 kg 83.915 kg     There is no height or weight on file to calculate BMI.  General:  Thin, middle aged male. Sitting in the bed in no acute distress HEENT: normal Neck: no JVD Cardiac:  normal S1, S2; RRR; no murmur. Has a healed scar over his sternum that is tender to palpation  Lungs:  diminished breath sounds in bilateral lung bases. Normal WOB on room air  Abd: soft, nontender Ext: no edema in BLE Musculoskeletal:  No  deformities, BUE and BLE strength normal and equal Skin: warm and dry  Neuro:  CNs 2-12 intact, no focal abnormalities noted Psych:  Normal affect   EKG:  The EKG was personally reviewed and demonstrates:  Atrial flutter, HR 143 BPM  Telemetry:  Telemetry was personally reviewed and demonstrates:  Patient converted to NSR with HR in the 90s   Relevant CV Studies:   Laboratory Data:  High Sensitivity Troponin:   Recent Labs  Lab 05/15/22 1602  TROPONINIHS 39*     Chemistry Recent Labs  Lab 05/15/22 1602 05/15/22 1621  NA 134* 135  K 4.4 4.4  CL 95* 100  CO2 23  --   GLUCOSE 87 87  BUN 27* 28*  CREATININE 6.29* 7.40*  CALCIUM 9.5  --   MG 2.0  --   GFRNONAA 9*  --   ANIONGAP 16*  --     Recent Labs  Lab 05/15/22 1602  PROT 8.2*  ALBUMIN 4.4  AST 20  ALT 11  ALKPHOS 129*  BILITOT 1.1   Lipids No results for input(s): "CHOL", "TRIG", "HDL", "LABVLDL", "LDLCALC", "CHOLHDL" in the last 168 hours.  Hematology Recent Labs  Lab 05/15/22 1602 05/15/22 1621  WBC 8.0  --   RBC 4.43  --   HGB 13.4 15.3  HCT 42.7 45.0  MCV 96.4  --   MCH 30.2  --   MCHC 31.4  --   RDW 17.1*  --   PLT 169  --    Thyroid No results for input(s): "TSH", "FREET4" in the last 168 hours.  BNP Recent Labs  Lab 05/15/22 1602  BNP 541.5*    DDimer No results for input(s): "DDIMER" in the last 168 hours.   Radiology/Studies:  DG Chest Portable 1 View  Result Date: 05/15/2022 CLINICAL DATA:  Shortness of breath.  Dialysis. EXAM: PORTABLE CHEST 1 VIEW COMPARISON:  12/25/2021. FINDINGS: Left IJ approach dialysis catheter with tip projecting over the right atrium. Stable cardiac and mediastinal contours with postoperative changes of median sternotomy and CABG. Hazy retrocardiac opacity, favored to reflect atelectasis. No pleural effusion or pneumothorax. IMPRESSION: Hazy retrocardiac opacity, favored to reflect atelectasis. Electronically Signed   By: Emmit Alexanders M.D.   On:  05/15/2022 16:50     Assessment and Plan:   Paroxysmal Atrial Flutter - Patient presented to the ED in atrial flutter with HR in the 140s. He did have atrial fib/flutter in the past. First after his CABG in 09/2021, later in 09/2021 and 10/2021 during his admission for pericardial tamponade - He  was given PO metoprolol succinate 25 mg in the ED and he converted to NSR. Now in NSR with HR in the 90s  - Continue PO metoprolol succinate 25 mg daily  - Start PO amiodarone 400 mg BID for 10 days. Decrease dose to 200 mg daily after - Start eliquis 5 mg BID  - Ordered echocardiogram   Chest pain  - Patient complained of having chest tightness when he was in afib with RVR. Has not had chest tightness since HR has been controlled - Initial hsTn 39. This could be demand ischemia in the setting of afib with RVR and ESRD - Follow second troponin  - Echocardiogram pending as above   ESRD  HFpEF - Most recent echocardiogram from 10/2021 showed 65-70%, mild LVH - Echo this admission pending - Patient had to cut his dialysis appointment short today. He also has complained of SOB and orthopnea for about 1 month even with compliance with HD. May need to take off more fluid  - Will likely need HD tomorrow   CAD s/p CABG - Continue home metoprolol succinate 25 mg daily  - Continue home lipitor 80 mg daily  - Hold home ASA as we are staring eliquis   History of Pericardial Effusion  - Echo this admission pending - BP stable. Heart sounds are not muffled and no JVD on exam   Risk Assessment/Risk Scores:    New York Heart Association (NYHA) Functional Class NYHA Class III  CHA2DS2-VASc Score = 6  This indicates a 9.7% annual risk of stroke. The patient's score is based upon: CHF History: 1 HTN History: 1 Diabetes History: 1 Stroke History: 2 Vascular Disease History: 1 Age Score: 0 Gender Score: 0    For questions or updates, please contact [place] Please consult  [email or web address] for contact info under    Signed, Margie Billet, PA-C  05/15/2022 6:40 PM

## 2022-05-15 NOTE — ED Triage Notes (Signed)
Pt to ED via EMS from dialysis with tachycardia. Per EMS dialysis center stated pt HR jumped to 140s. Pt reports intermittent chest pain, with SOB. Pt had 300L of fluid removed in dialysis today. Aox4 in triage, tachycardic, and tachypnea. EMS reports vitas HR 140s, BP 160/103, O2 sats 96% RA.

## 2022-05-15 NOTE — Assessment & Plan Note (Signed)
-  pt previously on insulin but has not been since rehab -no hyperglycemia on presentation. Will recheck A1C.

## 2022-05-15 NOTE — H&P (Addendum)
History and Physical    Patient: [name] [id number] DOB: Jul 06, 1960 DOA: 05/15/2022 DOS: the patient was seen and examined on 05/15/2022 PCP: [name], MD  Patient coming from: Home  Chief Complaint:  Chief Complaint  Patient presents with   Tachycardia   Shortness of Breath   HPI: [name] is a 62 y.o. 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   He presented with ectopic atrial tachycardia versus flutter after receiving about 2 and half hour of dialysis.  He converted spontaneously to sinus rhythm.  He was then given oral metoprolol in ED and cardiology was consulted who recommended starting oral amiodarone load.  Hospitalist then consulted for admission.  Patient states he was otherwise in his normal state of health.  However began to have some chest tightness in the middle of dialysis with increasing shortness of breath.  He was told he had fast heart rate and hypoxia and was sent to ED.  He recently got out of rehab about a month ago and did not get most of his prescriptions filled.  He states he has missed metoprolol for at least 2 weeks.  In the ED, he otherwise was afebrile, normotensive on room air.  CBC was unremarkable. No significant electrolyte abnormalities.  Creatinine of 7.40 which is around his baseline.  BNP mildly elevated at 541.  Troponin mildly elevated but flat at 39 and 37.  Review of Systems: As mentioned in the history of present illness. All other systems reviewed and are negative. Past Medical History:  Diagnosis Date   AKI (acute kidney injury)    pt unaware of this   Anginal pain    CAD (coronary artery disease)    a. 03/2015 NSTEMI: LHC with severe 3V CAD  (70% mid RCA, 95% OM1, 90% distal LCx, 90% OM3, 80% prox LAD and 90% ost D1)  s/p DES to mLAD w/ small dissction Rx with DES, staged ost Ramus PCI/DES and dLCx s/p PCI/DES    Chest pain 12/24/2020   Diabetes mellitus type 2 in obese    Diverticulosis    Dyspnea    Dyspnea on exertion 03/16/2015   Dyspnea on exertion   ESRD (end stage renal disease) on dialysis    Family history of adverse reaction to anesthesia    patient father- pt states after anesthesia his father "developed dementia"   GERD (gastroesophageal reflux disease)    Hypercholesteremia    Hypertension associated with diabetes 03/16/2015   hypertension   NSTEMI (non-ST elevated myocardial infarction) 03/17/2015   Obesity    Stroke 2022   pt states he had a "mini stroke" during cardiac catheterization   Tobacco abuse    Past Surgical History:  Procedure Laterality Date   APPLICATION OF WOUND VAC N/A 10/03/2021   Procedure: APPLICATION OF WOUND VAC;  Surgeon: Dahlia Byes, MD;  Location: [place];  Service: Thoracic;  Laterality: N/A;   AV FISTULA PLACEMENT Right 03/14/2022   Procedure: RIGHT ARM ARTERIOVENOUS (AV) FISTULA VERSUS ARTERIOVENOUS GRAFT CREATION;  Surgeon: Serafina Mitchell, MD;  [facility];  Service: Vascular;  Laterality: Right;   CANNULATION FOR ECMO (EXTRACORPOREAL MEMBRANE OXYGENATION) N/A 09/29/2021   Procedure: CANNULATION FOR ECMO (EXTRACORPOREAL MEMBRANE OXYGENATION);  Surgeon: Lajuana Matte, MD;  Location: [place];  Service: Open Heart Surgery;  Laterality: N/A;  CARDIAC CATHETERIZATION N/A 03/19/2015   Procedure: Left Heart Cath and Coronary Angiography;  Surgeon: Lorretta Harp, MD;  Location: [place] [facility];  Service: Cardiovascular;  Laterality: N/A;   CARDIAC CATHETERIZATION N/A 03/20/2015   Procedure: Coronary Stent Intervention;  Surgeon: Lorretta Harp, MD;  Location: [place] [facility];  Service: Cardiovascular;  Laterality: N/A;   CARDIAC CATHETERIZATION N/A 03/22/2015   Procedure: Coronary Stent Intervention;  Surgeon: Lorretta Harp, MD;  Location:  [place] [facility];  Service: Cardiovascular;  Laterality: N/A;   CORONARY ARTERY BYPASS GRAFT N/A 09/12/2021   Procedure: CORONARY ARTERY BYPASS GRAFTING (CABG) X 5 USING LEFT INTERNAL MAMMARY ARTERY AND ENDOSCOPICALLY HARVESTED RIGHT GREATER SAPHENOUS VEIN.;  Surgeon: [name], MD;  Location: [place];  Service: Open Heart Surgery;  Laterality: N/A;   EXPLORATION POST OPERATIVE OPEN HEART N/A 09/30/2021   Procedure: EXPLORATION POST OPERATIVE OPEN HEART WASHOUT;  Surgeon: Lajuana Matte, MD;  Location: [place];  Service: Open Heart Surgery;  Laterality: N/A;   IR FLUORO GUIDE CV LINE LEFT  11/04/2021   IR FLUORO GUIDE CV LINE LEFT  12/06/2021   IR FLUORO GUIDE CV LINE LEFT  04/11/2022   IR GASTROSTOMY TUBE MOD SED  11/15/2021   IR GASTROSTOMY TUBE MOD SED  11/20/2021   IR GASTROSTOMY TUBE REMOVAL  01/20/2022   IR US GUIDE VASC ACCESS LEFT  11/04/2021   IR VENOCAVAGRAM SVC  12/06/2021   IR VENOCAVAGRAM SVC  04/11/2022   LEFT HEART CATH AND CORONARY ANGIOGRAPHY N/A 12/25/2020   Procedure: LEFT HEART CATH AND CORONARY ANGIOGRAPHY;  Surgeon: Troy Sine, MD;  Location: [place] [facility];  Service: Cardiovascular;  Laterality: N/A;   LEFT HEART CATH AND CORONARY ANGIOGRAPHY N/A 12/26/2020   Procedure: LEFT HEART CATH AND CORONARY ANGIOGRAPHY;  Surgeon: Troy Sine, MD;  Location: [place] [facility];  Service: Cardiovascular;  Laterality: N/A;   MEDIASTINAL EXPLORATION N/A 10/03/2021   Procedure: MEDIASTINAL WASHOUT;  Surgeon: Dahlia Byes, MD;  Location: [place];  Service: Thoracic;  Laterality: N/A;  PUMP STANDBY   MEDIASTINAL EXPLORATION N/A 10/12/2021   Procedure: MEDIASTINAL WASHOUT;  Surgeon: [name], MD;  Location: [place];  Service: Thoracic;  Laterality: N/A;   PERCUTANEOUS TRACHEOSTOMY N/A 10/17/2021   Procedure: PERCUTANEOUS TRACHEOSTOMY USING SHILEY FLEXIBLE 8 mm CUFFED [place].;  Surgeon: Candee Furbish, MD;  Location: [place];  Service: Pulmonary;  Laterality: N/A;   Percutaneous tracheostomy   PERICARDIAL WINDOW Right 09/28/2021   Procedure: PERICARDIAL WINDOW;  Surgeon: Lajuana Matte, MD;  Location: [place];  Service: Thoracic;  Laterality: Right;  Right VATS.  Lazy lateral.  double lumen ET tube   PLACEMENT OF IMPELLA LEFT VENTRICULAR ASSIST DEVICE N/A 10/07/2021   Procedure: INSERTION OF RIGHT AXILLARY IMPELLA, DECANNULATION OF ECMO, AND MEDIASTINAL WASHOUT;  Surgeon: [name], MD;  Location: [place];  Service: Open Heart Surgery;  Laterality: N/A;  Open right axillary with 8 mm Hemashield Platinum Vascular graft.   RADIOLOGY WITH ANESTHESIA N/A 11/20/2021   Procedure: G-Tube Placement;  Surgeon: Sandi Mariscal, MD;  Location: [place];  Service: Radiology;  Laterality: N/A;   REMOVAL OF IMPELLA LEFT VENTRICULAR ASSIST DEVICE Right 10/17/2021   Procedure: REMOVAL OF IMPELLA LEFT VENTRICULAR ASSIST DEVICE;  Surgeon: [name], MD;  Location: [place];  Service: Open Heart Surgery;  Laterality: Right;   STERNAL CLOSURE N/A 10/12/2021   Procedure: STERNAL CLOSURE;  Surgeon: [name], MD;  [facility];  Service: Thoracic;  Laterality: N/A;   TEE WITHOUT CARDIOVERSION N/A 09/12/2021   Procedure: TRANSESOPHAGEAL ECHOCARDIOGRAM (TEE);  Surgeon: [name], MD;  Location: [place];  Service: Open Heart Surgery;  Laterality: N/A;   TEE WITHOUT CARDIOVERSION N/A 09/29/2021   Procedure: TRANSESOPHAGEAL ECHOCARDIOGRAM (TEE);  Surgeon: Lajuana Matte, MD;  Location: [place];  Service: Open Heart Surgery;  Laterality: N/A;   TEE WITHOUT CARDIOVERSION N/A 10/03/2021   Procedure: TRANSESOPHAGEAL ECHOCARDIOGRAM (TEE);  Surgeon: Dahlia Byes, MD;  Location: [place];  Service: Thoracic;  Laterality: N/A;   TEE WITHOUT CARDIOVERSION  10/07/2021   Procedure: TRANSESOPHAGEAL ECHOCARDIOGRAM (TEE);  Surgeon: [name], MD;  Location: [facility];  Service: Open Heart Surgery;;   TEE WITHOUT CARDIOVERSION N/A 10/12/2021   Procedure: TRANSESOPHAGEAL ECHOCARDIOGRAM  (TEE);  Surgeon: [name], MD;  Location[facility] OR;  Service: Thoracic;  Laterality: N/A;   TEE WITHOUT CARDIOVERSION N/A 10/17/2021   Procedure: TRANSESOPHAGEAL ECHOCARDIOGRAM (TEE);  Surgeon: [name], MD;  Location: [place];  Service: Open Heart Surgery;  Laterality: N/A;   TESTICLE SURGERY  1970   pt states testicle was ascended and had to be "pulled down"   TONSILLECTOMY     as a child   Social History:  reports that he quit smoking about 7 years ago. His smoking use included cigarettes. He smoked an average of .5 packs per day. He has never used smokeless tobacco. He reports that he does not currently use alcohol. He reports that he does not currently use drugs.  No Known Allergies  History reviewed. No pertinent family history.  Prior to Admission medications   Medication Sig Start Date End Date Taking? Authorizing Provider  acetaminophen (TYLENOL) 325 MG tablet Take 2 tablets (650 mg total) by mouth every 6 (six) hours as needed for mild pain, headache or fever. 12/03/21   Barrett, Erin R, PA-C  albuterol (VENTOLIN HFA) 108 (90 Base) MCG/ACT inhaler Inhale 1-2 puffs into the lungs every 4 (four) hours as needed for wheezing or shortness of breath.    [provider]  albuterol (VENTOLIN HFA) 108 (90 Base) MCG/ACT inhaler Inhale 2 puffs three times daily as needed for shortness of breath 04/03/22     Amino Acids-Protein Hydrolys (FEEDING SUPPLEMENT, PRO-STAT 64,) LIQD Take 30 mLs by mouth 2 (two) times daily.    [provider]  ascorbic acid (VITAMIN C) 500 MG tablet Take 500 mg by mouth daily.    [provider]  aspirin 81 MG chewable tablet Chew 1 tablet (81 mg total) by mouth daily. 12/03/21   Barrett, Erin R, PA-C  atorvastatin (LIPITOR) 80 MG tablet Take 1 tablet (80 mg total) by mouth daily. Patient taking differently: Take 40 mg by mouth daily. 09/06/21   Charlott Rakes, MD  atorvastatin (LIPITOR) 80 MG tablet Take 1 tablet (80 mg total) by  mouth daily. 04/03/22     Continuous Blood Gluc Sensor (FREESTYLE LIBRE 2 SENSOR) MISC Utilize as directed q 14 days to monitor blood sugar. 05/10/21   Charlott Rakes, MD  folic acid (FOLVITE) 1 MG tablet Take 1 tablet (1 mg total) by mouth daily. 12/03/21   Barrett, Erin R, PA-C  glucose blood (TRUE METRIX BLOOD GLUCOSE TEST) test strip Use to check blood sugar three times daily. 06/10/21   Charlott Rakes, MD  guaifenesin (ROBITUSSIN) 100 MG/5ML syrup Take 200 mg by mouth every 4 (four) hours as needed for cough.    [provider]  insulin aspart (NOVOLOG) 100 UNIT/ML injection Inject 0-6 Units into the skin every 4 (four) hours. CBG 70-150 ( 0 units) 151-200 (1 unit) 201-250 (2 units) 251-300 ( 3 units) 301-350 ( 4 units) 351-400 (5 units) >400 ( 6 units) 12/03/21   Barrett, Erin R, PA-C  Insulin Pen Needle (TRUEPLUS 5-BEVEL PEN NEEDLES) 32G X 4 MM MISC Use to inject Basaglar once daily. 05/10/21   Charlott Rakes, MD  leptospermum manuka honey (MEDIHONEY) PSTE paste Apply 1 Application topically daily. Apply Medihoney to sacrum/bilat buttock wound Q day, then cover with gauze and abd pad and tape  Apply thin layer (3 mm) to wound. 12/03/21   Barrett, Erin R, PA-C  metoprolol succinate (TOPROL XL) 25 MG 24 hr tablet Take 0.5 tablet by mouth daily for Afib. 04/03/22     metoprolol succinate (TOPROL-XL) 25 MG 24 hr tablet Take 25 mg by mouth daily.    [provider]  metoprolol succinate (TOPROL-XL) 25 MG 24 hr tablet Take 12.5 mg by mouth daily. Takes at 0800    [provider]  metoprolol succinate (TOPROL-XL) 25 MG 24 hr tablet Take 1/2 tablet (12.5 mg total) by mouth at bedtime. 05/09/22     midodrine (PROAMATINE) 10 MG tablet Take 1 tablet (10 mg total) by mouth 3 (three) times daily with meals. Patient taking differently: Take 5 mg by mouth 2 (two) times daily. Hold if SBP > 130 12/03/21   Barrett, Erin R, PA-C  midodrine (PROAMATINE) 10 MG tablet Take 0.5 tablets  (5 mg total) by mouth 2 (two) times daily for blood pressure. 04/03/22     multivitamin (RENA-VIT) TABS tablet Take 1 tablet by mouth at bedtime. 12/03/21   Barrett, Erin R, PA-C  Nutritional Supplements (FEEDING SUPPLEMENT, OSMOLITE 1.5 CAL,) LIQD Place 237 mLs into feeding tube 4 (four) times daily. Bolus a full carton of Osmolite 1.5 (237 ml) formula via GRAVITY per PEG using a 50-60 cc syringe. Flush PEG with 20 ml before and after each bolus.  Pt is willing to take bolus tube feeds. He would prefer not to be asked each time whether he wants the bolus feed but instead would like it to be administered at the scheduled time. 12/03/21   Barrett, Lodema Hong, PA-C  oxyCODONE-acetaminophen (PERCOCET) 5-325 MG tablet Take 1 tablet by mouth every 6 (six) hours as needed for severe pain. 03/14/22 03/14/23  Baglia, Corrina, PA-C  pantoprazole (PROTONIX) 40 MG tablet Take 1 tablet (40 mg total) by mouth 2 (two) times daily. Patient taking differently: Take 40 mg by mouth daily. 12/03/21   Barrett, Erin R, PA-C  polyvinyl alcohol (LIQUIFILM TEARS) 1.4 % ophthalmic solution Place 2 drops into both eyes as needed for dry eyes. 12/03/21   Barrett, Erin R, PA-C  sevelamer carbonate (RENVELA) 800 MG tablet Take 2 tablets (1,600 mg total) by mouth 3 (three) times daily with meals. 04/03/22     thiamine (VITAMIN B-1) 100 MG tablet Take 1 tablet (100 mg total) by mouth daily. 12/03/21   Barrett, Erin R, PA-C  TRUEplus Lancets 28G MISC Use to check blood sugar three times daily. 06/10/21   Charlott Rakes, MD  zinc gluconate 50 MG tablet Take 50 mg by mouth daily.    [provider]    Physical Exam: Vitals:   05/15/22 2137 05/15/22 2210 05/15/22 2217 05/15/22 2230  BP:  129/81  111/73  Pulse:  96  89  Resp: 18 18  18   Temp:   Marland Kitchen)  97.5 F (36.4 C)   TempSrc:   Oral   SpO2:  97%  99%   Constitutional: NAD, calm, comfortable, elderly male laying in bed on cell phone Eyes: lids and conjunctivae normal ENMT:  Mucous membranes are moist.  Neck: normal, supple Respiratory: clear to auscultation bilaterally, no wheezing, no crackles. Normal respiratory effort. No accessory muscle use. On room air. Cardiovascular: Regular rate and rhythm, no murmurs / rubs / gallops. No extremity edema. Maturing AV fistula on right UE Abdomen: no tenderness, Bowel sounds positive.  Musculoskeletal: no clubbing / cyanosis. No joint deformity upper and lower extremities. Good ROM, no contractures. Normal muscle tone.  Skin: no rashes, lesions, ulcers.  Neurologic: CN 2-12 grossly intact.  Psychiatric: Normal judgment and insight. Alert and oriented x 3. Frustrated mood. Data Reviewed:  See HPI  Assessment and Plan: * Atrial flutter with rapid ventricular response -Presented with atrial flutter versus ectopic atrial tachycardia halfway through dialysis today -reports missing his metoprolol for at least 2 weeks.  No other significant electrolyte abnormalities. -Spontaneous converted back to normal sinus rhythm in the ED -Cardiology has evaluated and will continue his metoprolol and start oral loading dose of amiodarone 400mg  BID x 10 days then 200mg  daily thereafter -start Eliquis 5mg  BID  -obtain echocardiogram  -Keep on continuous telemetry  ESRD on dialysis -creatinine is stable around baseline -HD T/Th/Sat - received half of his dialysis today (Thurs) before presenting to ED  S/P CABG x 5 -stable. Troponin mildly elevated but flat at 39 and 37 likely demand ischemia from atrial flutter. -Patient has complex history of PEA arrest in the setting of cardiac tamponade in 09/2021 requiring ECMO and impella  -obtaining repeat echocardiogram per cardiology  Diabetes mellitus type 2 with neurological manifestations -pt previously on insulin but has not been since rehab -no hyperglycemia on presentation. Will recheck A1C.  Hyperlipidemia associated with type 2 diabetes mellitus -Continue statin  Hypertension  associated with diabetes - BP is stable not on any antihypertensives      Advance Care Planning:   Code Status: Full Code   Consults: cardiology  Family Communication: none at bedside  Severity of Illness: The appropriate patient status for this patient is OBSERVATION. Observation status is judged to be reasonable and necessary in order to provide the required intensity of service to ensure the patient's safety. The patient's presenting symptoms, physical exam findings, and initial radiographic and laboratory data in the context of their medical condition is felt to place them at decreased risk for further clinical deterioration. Furthermore, it is anticipated that the patient will be medically stable for discharge from the hospital within 2 midnights of admission.   Author: Orene Desanctis, DO 05/15/2022 11:03 PM  For on call review [email or web address].

## 2022-05-15 NOTE — Assessment & Plan Note (Signed)
-   BP is stable not on any antihypertensives

## 2022-05-15 NOTE — Assessment & Plan Note (Signed)
-  stable. Troponin mildly elevated but flat at 39 and 37 likely demand ischemia from atrial flutter. -Patient has complex history of PEA arrest in the setting of cardiac tamponade in 09/2021 requiring ECMO and impella  -obtaining repeat echocardiogram per cardiology

## 2022-05-15 NOTE — ED Provider Notes (Signed)
[facility] Provider Note   CSN: [id number] Arrival date & time: 05/15/22  1547     History  Chief Complaint  Patient presents with  . Tachycardia  . Shortness of Breath    [name] is a 62 y.o. male.  With PMH of ESRD on HD Tuesday Thursday Saturday, CAD status post CABG, DM2 who is brought in from dialysis center with episode of hypotension and tachycardia heart rate in the 140s with complaints of chest pain and shortness of breath.  Patient notes increasing discomfort substernal nonradiating chest tightness mainly with exertion that relieves with rest over the past couple of weeks.  While he was at dialysis today getting dialyzed about 2 and half hours and he began to feel short of breath with chest discomfort.  He was found to be hypotensive with heart rate in the 140s and dialysis was discontinued.  He reportedly had 3000 ml of fluid removed.  He is still complaining of chest discomfort but no severe shortness of breath.  He has had normal weights slightly increased by maybe a couple pounds.  No swelling in the legs.  No history of PE or DVT he is not on any anticoagulation.  He is compliant with his aspirin daily.  He has had no recent fevers or illness, no vomiting no diarrhea.   Shortness of Breath      Home Medications Prior to Admission medications   Medication Sig Start Date End Date Taking? Authorizing Provider  acetaminophen (TYLENOL) 325 MG tablet Take 2 tablets (650 mg total) by mouth every 6 (six) hours as needed for mild pain, headache or fever. 12/03/21   Barrett, Erin R, PA-C  albuterol (VENTOLIN HFA) 108 (90 Base) MCG/ACT inhaler Inhale 1-2 puffs into the lungs every 4 (four) hours as needed for wheezing or shortness of breath.    [provider]  albuterol (VENTOLIN HFA) 108 (90 Base) MCG/ACT inhaler Inhale 2 puffs three times daily as needed for shortness of breath 04/03/22     Amino Acids-Protein Hydrolys  (FEEDING SUPPLEMENT, PRO-STAT 64,) LIQD Take 30 mLs by mouth 2 (two) times daily.    [provider]  ascorbic acid (VITAMIN C) 500 MG tablet Take 500 mg by mouth daily.    [provider]  aspirin 81 MG chewable tablet Chew 1 tablet (81 mg total) by mouth daily. 12/03/21   Barrett, Erin R, PA-C  atorvastatin (LIPITOR) 80 MG tablet Take 1 tablet (80 mg total) by mouth daily. Patient taking differently: Take 40 mg by mouth daily. 09/06/21   Charlott Rakes, MD  atorvastatin (LIPITOR) 80 MG tablet Take 1 tablet (80 mg total) by mouth daily. 04/03/22     Continuous Blood Gluc Sensor (FREESTYLE LIBRE 2 SENSOR) MISC Utilize as directed q 14 days to monitor blood sugar. 05/10/21   Charlott Rakes, MD  folic acid (FOLVITE) 1 MG tablet Take 1 tablet (1 mg total) by mouth daily. 12/03/21   Barrett, Erin R, PA-C  glucose blood (TRUE METRIX BLOOD GLUCOSE TEST) test strip Use to check blood sugar three times daily. 06/10/21   Charlott Rakes, MD  guaifenesin (ROBITUSSIN) 100 MG/5ML syrup Take 200 mg by mouth every 4 (four) hours as needed for cough.    [provider]  insulin aspart (NOVOLOG) 100 UNIT/ML injection Inject 0-6 Units into the skin every 4 (four) hours. CBG 70-150 ( 0 units) 151-200 (1 unit) 201-250 (2 units) 251-300 ( 3 units) 301-350 (  4 units) 351-400 (5 units) >400 ( 6 units) 12/03/21   Barrett, Erin R, PA-C  Insulin Pen Needle (TRUEPLUS 5-BEVEL PEN NEEDLES) 32G X 4 MM MISC Use to inject Basaglar once daily. 05/10/21   Charlott Rakes, MD  leptospermum manuka honey (MEDIHONEY) PSTE paste Apply 1 Application topically daily. Apply Medihoney to sacrum/bilat buttock wound Q day, then cover with gauze and abd pad and tape  Apply thin layer (3 mm) to wound. 12/03/21   Barrett, Erin R, PA-C  metoprolol succinate (TOPROL XL) 25 MG 24 hr tablet Take 0.5 tablet by mouth daily for Afib. 04/03/22     metoprolol succinate (TOPROL-XL) 25 MG 24 hr tablet Take 25 mg by mouth daily.     [provider]  metoprolol succinate (TOPROL-XL) 25 MG 24 hr tablet Take 12.5 mg by mouth daily. Takes at 0800    [provider]  metoprolol succinate (TOPROL-XL) 25 MG 24 hr tablet Take 1/2 tablet (12.5 mg total) by mouth at bedtime. 05/09/22     midodrine (PROAMATINE) 10 MG tablet Take 1 tablet (10 mg total) by mouth 3 (three) times daily with meals. Patient taking differently: Take 5 mg by mouth 2 (two) times daily. Hold if SBP > 130 12/03/21   Barrett, Erin R, PA-C  midodrine (PROAMATINE) 10 MG tablet Take 0.5 tablets (5 mg total) by mouth 2 (two) times daily for blood pressure. 04/03/22     multivitamin (RENA-VIT) TABS tablet Take 1 tablet by mouth at bedtime. 12/03/21   Barrett, Erin R, PA-C  Nutritional Supplements (FEEDING SUPPLEMENT, OSMOLITE 1.5 CAL,) LIQD Place 237 mLs into feeding tube 4 (four) times daily. Bolus a full carton of Osmolite 1.5 (237 ml) formula via GRAVITY per PEG using a 50-60 cc syringe. Flush PEG with 20 ml before and after each bolus.  Pt is willing to take bolus tube feeds. He would prefer not to be asked each time whether he wants the bolus feed but instead would like it to be administered at the scheduled time. 12/03/21   Barrett, Lodema Hong, PA-C  oxyCODONE-acetaminophen (PERCOCET) 5-325 MG tablet Take 1 tablet by mouth every 6 (six) hours as needed for severe pain. 03/14/22 03/14/23  Baglia, Corrina, PA-C  pantoprazole (PROTONIX) 40 MG tablet Take 1 tablet (40 mg total) by mouth 2 (two) times daily. Patient taking differently: Take 40 mg by mouth daily. 12/03/21   Barrett, Erin R, PA-C  polyvinyl alcohol (LIQUIFILM TEARS) 1.4 % ophthalmic solution Place 2 drops into both eyes as needed for dry eyes. 12/03/21   Barrett, Erin R, PA-C  sevelamer carbonate (RENVELA) 800 MG tablet Take 2 tablets (1,600 mg total) by mouth 3 (three) times daily with meals. 04/03/22     thiamine (VITAMIN B-1) 100 MG tablet Take 1 tablet (100 mg total) by mouth daily. 12/03/21    Barrett, Erin R, PA-C  TRUEplus Lancets 28G MISC Use to check blood sugar three times daily. 06/10/21   Charlott Rakes, MD  zinc gluconate 50 MG tablet Take 50 mg by mouth daily.    [provider]      Allergies    Patient has no known allergies.    Review of Systems   Review of Systems  Respiratory:  Positive for shortness of breath.     Physical Exam Updated Vital Signs BP 125/84   Pulse 96   Resp (!) 23   SpO2 100%  Physical Exam Constitutional: Alert and oriented.  Slightly ill in appearance but no acute  distress, speaking full sentences Eyes: Conjunctivae are normal. ENT      Head: Normocephalic and atraumatic. Cardiovascular: S1, S2, tachycardic, distal extremities are warm and dry, decreased pulses of the radial pulses bilaterally; left-sided chest wall indwelling dialysis catheter clean dry and intact. Respiratory: Decreased breath sounds at bases, mild tachypnea, O2 sat 96 on RA Gastrointestinal: Soft and nontender. Musculoskeletal: Normal range of motion in all extremities. No pitting edema of lower extremities Neurologic: Normal speech and language. No gross focal neurologic deficits are appreciated. Skin: Skin is warm, dry and intact. No rash noted. Psychiatric: Mood and affect are normal. Speech and behavior are normal.  ED Results / Procedures / Treatments   Labs (all labs ordered are listed, but only abnormal results are displayed) Labs Reviewed  COMPREHENSIVE METABOLIC PANEL - Abnormal; Notable for the following components:      Result Value   Sodium 134 (*)    Chloride 95 (*)    BUN 27 (*)    Creatinine, Ser 6.29 (*)    Total Protein 8.2 (*)    Alkaline Phosphatase 129 (*)    GFR, Estimated 9 (*)    Anion gap 16 (*)    All other components within normal limits  CBC WITH DIFFERENTIAL/PLATELET - Abnormal; Notable for the following components:   RDW 17.1 (*)    Abs Immature Granulocytes 0.10 (*)    All other components within normal limits   BRAIN NATRIURETIC PEPTIDE - Abnormal; Notable for the following components:   B Natriuretic Peptide 541.5 (*)    All other components within normal limits  I-STAT CHEM 8, ED - Abnormal; Notable for the following components:   BUN 28 (*)    Creatinine, Ser 7.40 (*)    Calcium, Ion 1.06 (*)    All other components within normal limits  TROPONIN I (HIGH SENSITIVITY) - Abnormal; Notable for the following components:   Troponin I (High Sensitivity) 39 (*)    All other components within normal limits  MAGNESIUM  TROPONIN I (HIGH SENSITIVITY)    EKG EKG Interpretation  Date/Time:  Thursday May 15 2022 17:49:00 EDT Ventricular Rate:  99 PR Interval:  137 QRS Duration: 91 QT Interval:  339 QTC Calculation: 435 R Axis:   75 Text Interpretation: Sinus rhythm Biatrial enlargement Nonspecific T abnormalities, inferior leads and latera; ;eads Confirmed by Georgina Snell [phone]) on 05/15/2022 6:15:08 PM  Radiology DG Chest Portable 1 View  Result Date: 05/15/2022 CLINICAL DATA:  Shortness of breath.  Dialysis. EXAM: PORTABLE CHEST 1 VIEW COMPARISON:  12/25/2021. FINDINGS: Left IJ approach dialysis catheter with tip projecting over the right atrium. Stable cardiac and mediastinal contours with postoperative changes of median sternotomy and CABG. Hazy retrocardiac opacity, favored to reflect atelectasis. No pleural effusion or pneumothorax. IMPRESSION: Hazy retrocardiac opacity, favored to reflect atelectasis. Electronically Signed   By: Emmit Alexanders M.D.   On: 05/15/2022 16:50    Procedures .Critical Care  Performed by: Elgie Congo, MD Authorized by: Elgie Congo, MD   Critical care provider statement:    Critical care time (minutes):  45   Critical care was necessary to treat or prevent imminent or life-threatening deterioration of the following conditions:  Cardiac failure and circulatory failure   Critical care was time spent personally by me on the following  activities:  Development of treatment plan with patient or surrogate, discussions with consultants, evaluation of patient's response to treatment, examination of patient, ordering and review of laboratory studies, ordering and review of  radiographic studies, ordering and performing treatments and interventions, pulse oximetry, re-evaluation of patient's condition, review of old charts and obtaining history from patient or surrogate   Care discussed with: admitting provider       Medications Ordered in ED Medications  amiodarone (PACERONE) tablet 400 mg (has no administration in time range)  apixaban (ELIQUIS) tablet 5 mg (has no administration in time range)  aspirin chewable tablet 324 mg (324 mg Oral Given 05/15/22 1644)  metoprolol succinate (TOPROL-XL) 24 hr tablet 25 mg (25 mg Oral Given 05/15/22 1730)    ED Course/ Medical Decision Making/ A&P Clinical Course as of 05/15/22 2156  Thu May 15, 2022  1632 Spoke with on-call STEMI doctor Dr. Burt Knack just to review patient's EKG.  We are both in agreement at this time that this is likely all rate driven ischemia he has what appears to be a flutter on his EKG with diffuse ischemia and depressions.  Likely all rate driven.  I am going to amnio load patient and keep him on infusion which Dr. Burt Knack is in agreement with I have ordered for aspirin chewable tablet.  Will page on-call cardiology to see patient. [VB]  [id number] Reassessed patient he spontaneously converted out of a flutter RVR.  Discontinued amiodarone.  Putting in home dose of metoprolol [VB]  1805 Reported labs reviewed by me.  Creatinine 6.29 consistent with known ESRD.  Potassium 4.4 within normal limits.  Mild hyponatremia and hypochloremia.  Sodium 134 chloride 95.  Troponin 39 down trended from prior. white blood cell count 8.0 with no left shift.  Normal hemoglobin 13.4 no anemia. [VB]  [id number] Chest x-ray reviewed by me personally no pneumonia no pulmonary edema or pleural effusions.   Possible retrocardiac opacity or atelectasis favor atelectasis.  He is afebrile without infectious symptoms and normal white blood cell count do not suspect pneumonia. [VB]  1910 Patient has been seen and evaluate by cardiology they will follow as consulting service on admission.  Recommend admission to medicine.  They recommend trending troponins, starting patient on Eliquis 5 twice daily and amiodarone twice daily with continue metoprolol.  They have ordered for echocardiogram. [VB]  1933 Spoke with Dr. Sherrian Divers of hospitalist service who will be down to evaluate the patient and put in orders for admission for continued management of chest pain and new episode of a flutter RVR currently in sinus rhythm. [VB]    Clinical Course User Index [VB] Elgie Congo, MD                             Medical Decision Making  [name] is a 62 y.o. male.  With PMH of ESRD on HD Tuesday Thursday Saturday, CAD status post CABG, DM2 who is brought in from dialysis center with episode of hypotension and tachycardia heart rate in the 140s with complaints of chest pain and shortness of breath.    Patient presented ill-appearing with slightly low but stable blood pressure in a flutter RVR.  He had diffuse ST depression with slight elevation in aVR with active chest discomfort.  I spoke with Dr. Harl Bowie on-call STEMI doctor who recommended treating patient for a flutter RVR.  My plan is to start patient on amiodarone drip which he agreed with.  He spontaneously converted out of a flutter RVR into sinus rhythm.  Given home dose of metoprolol extended release.  Chest x-ray reviewed by me no pneumonia, no pleural effusion, retrocardiac  opacity but not consistent with pneumonia as he has no infectious symptoms and normal white blood cell count 8.0. labs reviewed by me.  Creatinine 6.29 consistent with known ESRD.  Potassium 4.4 within normal limits.  Mild hyponatremia and hypochloremia.  Sodium 134 chloride 95. K 4.4.  Troponin 39 down trended from prior. white blood cell count 8.0 with no left shift.  Normal hemoglobin 13.4 no anemia.  Patient has been seen and evaluate by cardiology they will follow as consulting service on admission.  Recommend admission to medicine.  They recommend trending troponins, starting patient on Eliquis 5 twice daily and amiodarone twice daily with continue metoprolol.  They have ordered for echocardiogram. [VB]  Spoke with Dr. Sherrian Divers of hospitalist service who will be down to evaluate the patient and put in orders for admission for continued management of chest pain and new episode of a flutter RVR currently in sinus rhythm. [VB]   Amount and/or Complexity of Data Reviewed Labs: ordered. Radiology: ordered.  Risk OTC drugs. Prescription drug management. Decision regarding hospitalization.    Final Clinical Impression(s) / ED Diagnoses Final diagnoses:  Atrial flutter with rapid ventricular response  Chest pain, unspecified type    Rx / DC Orders ED Discharge Orders     None         Elgie Congo, MD 05/15/22 1939

## 2022-05-15 NOTE — Assessment & Plan Note (Signed)
-  creatinine is stable around baseline -HD T/Th/Sat - received half of his dialysis today (Thurs) before presenting to ED

## 2022-05-15 NOTE — Assessment & Plan Note (Addendum)
-  Presented with atrial flutter versus ectopic atrial tachycardia halfway through dialysis today -reports missing his metoprolol for at least 2 weeks.  No other significant electrolyte abnormalities. -Spontaneous converted back to normal sinus rhythm in the ED -Cardiology has evaluated and will continue his metoprolol and start oral loading dose of amiodarone 400mg  BID x 10 days then 200mg  daily thereafter -start Eliquis 5mg  BID  -obtain echocardiogram  -Keep on continuous telemetry

## 2022-05-15 NOTE — Assessment & Plan Note (Signed)
Continue statin. 

## 2022-05-16 ENCOUNTER — Other Ambulatory Visit: Payer: Self-pay

## 2022-05-16 ENCOUNTER — Observation Stay (HOSPITAL_BASED_OUTPATIENT_CLINIC_OR_DEPARTMENT_OTHER): Payer: Medicare Other

## 2022-05-16 ENCOUNTER — Other Ambulatory Visit (HOSPITAL_COMMUNITY): Payer: Self-pay

## 2022-05-16 DIAGNOSIS — I4892 Unspecified atrial flutter: Secondary | ICD-10-CM | POA: Diagnosis not present

## 2022-05-16 DIAGNOSIS — E1169 Type 2 diabetes mellitus with other specified complication: Secondary | ICD-10-CM | POA: Diagnosis not present

## 2022-05-16 DIAGNOSIS — R0609 Other forms of dyspnea: Secondary | ICD-10-CM

## 2022-05-16 DIAGNOSIS — E1149 Type 2 diabetes mellitus with other diabetic neurological complication: Secondary | ICD-10-CM | POA: Diagnosis not present

## 2022-05-16 DIAGNOSIS — R079 Chest pain, unspecified: Secondary | ICD-10-CM | POA: Diagnosis not present

## 2022-05-16 DIAGNOSIS — N186 End stage renal disease: Secondary | ICD-10-CM | POA: Diagnosis not present

## 2022-05-16 LAB — ECHOCARDIOGRAM COMPLETE
Area-P 1/2: 4.49 cm2
Calc EF: 62.8 %
Height: 67 in
P 1/2 time: 277 msec
S' Lateral: 2.5 cm
Single Plane A2C EF: 63.3 %
Single Plane A4C EF: 61.6 %
Weight: 2800 oz

## 2022-05-16 MED ORDER — METOPROLOL SUCCINATE ER 25 MG PO TB24
25.0000 mg | ORAL_TABLET | Freq: Every day | ORAL | 2 refills | Status: DC
  Filled 2022-05-16 – 2022-06-06 (×3): qty 30, 30d supply, fill #0
  Filled 2022-07-16: qty 30, 30d supply, fill #1
  Filled 2022-08-27: qty 30, 30d supply, fill #2

## 2022-05-16 MED ORDER — PERFLUTREN LIPID MICROSPHERE
1.0000 mL | INTRAVENOUS | Status: AC | PRN
  Administered 2022-05-16: 2 mL via INTRAVENOUS

## 2022-05-16 MED ORDER — AMIODARONE HCL 200 MG PO TABS
200.0000 mg | ORAL_TABLET | Freq: Every day | ORAL | 1 refills | Status: DC
  Filled 2022-05-16 – 2022-07-16 (×4): qty 30, 30d supply, fill #0
  Filled 2022-08-27: qty 30, 30d supply, fill #1

## 2022-05-16 MED ORDER — AMIODARONE HCL 200 MG PO TABS
400.0000 mg | ORAL_TABLET | Freq: Two times a day (BID) | ORAL | 0 refills | Status: DC
  Filled 2022-05-16: qty 66, 39d supply, fill #0

## 2022-05-16 MED ORDER — APIXABAN 5 MG PO TABS
5.0000 mg | ORAL_TABLET | Freq: Two times a day (BID) | ORAL | 2 refills | Status: DC
  Filled 2022-05-16 (×2): qty 60, 30d supply, fill #0
  Filled 2022-07-16 – 2022-09-11 (×2): qty 60, 30d supply, fill #1

## 2022-05-16 MED ORDER — PANTOPRAZOLE SODIUM 40 MG PO TBEC: 40.0000 mg | DELAYED_RELEASE_TABLET | Freq: Every day | ORAL | Status: DC

## 2022-05-16 NOTE — ED Notes (Signed)
Labs not collected pt refuse @3 :33am

## 2022-05-16 NOTE — Progress Notes (Signed)
Echocardiogram 2D Echocardiogram has been performed.  Toni Amend 05/16/2022, 1:28 PM

## 2022-05-16 NOTE — ED Notes (Signed)
Patient refused AM labs

## 2022-05-16 NOTE — Discharge Instructions (Signed)

## 2022-05-16 NOTE — ED Notes (Signed)
Walking around in room. Patient states he is ready to go home. Awaiting echo.

## 2022-05-16 NOTE — Discharge Summary (Signed)
Physician Discharge Summary   Patient: [name] MRN: [id number] DOB: 1960/08/03  Admit date:     05/15/2022  Discharge date: 05/16/22  Discharge Physician: Kathlen Mody   PCP: [name], MD   Recommendations at discharge:  Please follow up with PCp in 1 to 2 weeks.  Please follow up with cardiology as scheduled.   Discharge Diagnoses: Principal Problem:   Atrial flutter with rapid ventricular response Active Problems:   Hypertension associated with diabetes   Hyperlipidemia associated with type 2 diabetes mellitus   Diabetes mellitus type 2 with neurological manifestations   S/P CABG x 5   ESRD on dialysis   Hospital Course: [name] is a 62 y.o. 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  He presented with ectopic atrial tachycardia versus flutter after receiving about 2 and half hour of dialysis.  He converted spontaneously to sinus rhythm.  He was then given oral metoprolol in ED and cardiology was consulted who recommended starting oral amiodarone load.  Hospitalist then consulted for admission.     Assessment and Plan: * Atrial flutter with rapid ventricular response -Presented with atrial flutter versus ectopic atrial tachycardia halfway through dialysis . -Spontaneous converted back to normal sinus rhythm in the ED -Cardiology has evaluated and will continue his metoprolol and start oral loading dose of amiodarone 400mg  BID x 10 days then 200mg  daily thereafter -started Eliquis 5mg  BID  -Echocardiogram reviewed.    ESRD on dialysis -creatinine is stable around baseline -HD T/Th/Sat - . Patient reports that he is back to baseline and would like to be discharged to go back to HD in am.   S/P CABG x 5 -stable. Troponin mildly elevated but flat at 39 and  37 likely demand ischemia from atrial flutter. -Patient has complex history of PEA arrest in the setting of cardiac tamponade in 09/2021 requiring ECMO and impella  - patient currently denies any chest pain or sob and is adamant about being discharged today.   Diabetes mellitus type 2 with neurological manifestations -pt previously on insulin but has not been since rehab -no hyperglycemia on presentation.  - outpatient follow up of A1c.   Hyperlipidemia associated with type 2 diabetes mellitus -Continue statin  Hypertension associated with diabetes - BP is stable not on any antihypertensives         Consultants: cardiology Procedures performed: echo  Disposition: Home Diet recommendation:  Discharge Diet Orders (From admission, onward)     Start     Ordered   05/16/22 0000  Diet - low sodium heart healthy        05/16/22 1824           Renal diet DISCHARGE MEDICATION: Allergies as of 05/16/2022   No Known Allergies      Medication List     STOP taking these medications    acetaminophen-codeine 300-30 MG tablet Commonly known as: TYLENOL #3   amoxicillin 875 MG tablet Commonly known as: AMOXIL   carvedilol 3.125 MG tablet Commonly known as: COREG   ibuprofen 800 MG tablet Commonly known as: ADVIL   Steglatro 5 MG Tabs tablet Generic drug: ertugliflozin L-PyroglutamicAc       TAKE these medications    acetaminophen 325 MG tablet Commonly known as: TYLENOL Take 2 tablets (650 mg total) by mouth every 6 (  six) hours as needed for mild pain, headache or fever.   albuterol 108 (90 Base) MCG/ACT inhaler Commonly known as: VENTOLIN HFA Inhale 1-2 puffs into the lungs every 4 (four) hours as needed for wheezing or shortness of breath.   amiodarone 200 MG tablet Commonly known as: PACERONE Take 2 tablets (400 mg total) by mouth 2 (two) times daily for 9 days. Then take 1 tablet (200 mg total) by mouth daily.   amiodarone 200 MG tablet Commonly known  as: Pacerone Take 1 tablet (200 mg total) by mouth daily. Start taking on: May 26, 2022   ascorbic acid 500 MG tablet Commonly known as: VITAMIN C Take 500 mg by mouth daily.   aspirin 81 MG chewable tablet Chew 1 tablet (81 mg total) by mouth daily.   atorvastatin 80 MG tablet Commonly known as: LIPITOR Take 1 tablet (80 mg total) by mouth daily. What changed: Another medication with the same name was removed. Continue taking this medication, and follow the directions you see here.   Eliquis 5 MG Tabs tablet Generic drug: apixaban Take 1 tablet (5 mg total) by mouth 2 (two) times daily.   feeding supplement (OSMOLITE 1.5 CAL) Liqd Place 237 mLs into feeding tube 4 (four) times daily. Bolus a full carton of Osmolite 1.5 (237 ml) formula via GRAVITY per PEG using a 50-60 cc syringe. Flush PEG with 20 ml before and after each bolus.  Pt is willing to take bolus tube feeds. He would prefer not to be asked each time whether he wants the bolus feed but instead would like it to be administered at the scheduled time.   feeding supplement (PRO-STAT 64) Liqd Take 30 mLs by mouth 2 (two) times daily.   folic acid 1 MG tablet Commonly known as: FOLVITE Take 1 tablet (1 mg total) by mouth daily.   FreeStyle Libre 2 [employer] as directed q 14 days to monitor blood sugar.   guaifenesin 100 MG/5ML syrup Commonly known as: ROBITUSSIN Take 200 mg by mouth every 4 (four) hours as needed for cough.   insulin aspart 100 UNIT/ML injection Commonly known as: novoLOG Inject 0-6 Units into the skin every 4 (four) hours. CBG 70-150 ( 0 units) 151-200 (1 unit) 201-250 (2 units) 251-300 ( 3 units) 301-350 ( 4 units) 351-400 (5 units) >400 ( 6 units)   leptospermum manuka honey Pste paste Apply 1 Application topically daily. Apply Medihoney to sacrum/bilat buttock wound Q day, then cover with gauze and abd pad and tape  Apply thin layer (3 mm) to wound.   metoprolol succinate 25  MG 24 hr tablet Commonly known as: TOPROL-XL Take 1 tablet (25 mg total) by mouth daily. What changed: Another medication with the same name was removed. Continue taking this medication, and follow the directions you see here.   midodrine 10 MG tablet Commonly known as: PROAMATINE Take 0.5 tablets (5 mg total) by mouth 2 (two) times daily for blood pressure. What changed: Another medication with the same name was removed. Continue taking this medication, and follow the directions you see here.   multivitamin Tabs tablet Take 1 tablet by mouth at bedtime.   oxyCODONE-acetaminophen 5-325 MG tablet Commonly known as: Percocet Take 1 tablet by mouth every 6 (six) hours as needed for severe pain.   pantoprazole 40 MG tablet Commonly known as: PROTONIX Take 1 tablet (40 mg total) by mouth daily.   polyvinyl alcohol 1.4 % ophthalmic solution Commonly known as: LIQUIFILM TEARS Place  2 drops into both eyes as needed for dry eyes.   sevelamer carbonate 800 MG tablet Commonly known as: RENVELA Take 2 tablets (1,600 mg total) by mouth 3 (three) times daily with meals.   thiamine 100 MG tablet Commonly known as: Vitamin B-1 Take 1 tablet (100 mg total) by mouth daily.   True Metrix Blood Glucose Test test strip Generic drug: glucose blood Use to check blood sugar three times daily.   TRUEplus 5-Bevel Pen Needles 32G X 4 MM Misc Generic drug: Insulin Pen Needle Use to inject Basaglar once daily.   TRUEplus Lancets 28G Misc Use to check blood sugar three times daily.   zinc gluconate 50 MG tablet Take 50 mg by mouth daily.        Follow-up Information     [name], MD. Schedule an appointment as soon as possible for a visit in 1 week(s).   Specialty: Family Medicine Contact information: [address] [place] [address] [phone]         [name], MD .   Specialty: Family Medicine Contact information: [address] [place] [address] [phone]                Discharge Exam: Ceasar Mons Weights   05/16/22 0324  Weight: 79.4 kg   General exam: Appears calm and comfortable  Respiratory system: Clear to auscultation. Respiratory effort normal. Cardiovascular system: S1 & S2 heard, RRR. No JVD, murmurs, r Gastrointestinal system: Abdomen is nondistended, soft and nontender.  Central nervous system: Alert and oriented. No focal neurological deficits. Extremities: Symmetric 5 x 5 power. Skin: No rashes, lesions or ulcers Psychiatry: Judgement and insight appear normal. Mood & affect appropriate.    Condition at discharge: fair  The results of significant diagnostics from this hospitalization (including imaging, microbiology, ancillary and laboratory) are listed below for reference.   Imaging Studies: ECHOCARDIOGRAM COMPLETE  Result Date: 05/16/2022    ECHOCARDIOGRAM REPORT   Patient Name:   [name] [facility] Date of Exam: 05/16/2022 Medical Rec #:  [id number]    Height:       67.0 in Accession #:    [id number]   Weight:       175.0 lb Date of Birth:  17-Oct-1960    BSA:          1.911 m Patient Age:    62 years     BP:           133/103 mmHg Patient Gender: M            HR:           88 bpm. Exam Location:  Inpatient Procedure: 2D Echo, Cardiac Doppler and Color Doppler Indications:    R07.9* Chest pain, unspecified, Dyspnea  History:        Patient has prior history of Echocardiogram examinations. CAD,                 Prior CABG, Stroke; Risk Factors:Hypertension, Dyslipidemia and                 Diabetes.  Sonographer:    Mike Gip Referring Phys: [id number] Jonita Albee IMPRESSIONS  1. Left ventricular ejection fraction, by estimation, is 60 to 65%. The left ventricle has normal function. The left ventricle has no regional wall motion abnormalities. There is mild concentric left ventricular hypertrophy. Left ventricular diastolic parameters were normal.  2. Right ventricular systolic function is  normal. The right ventricular size  is normal. Tricuspid regurgitation signal is inadequate for assessing PA pressure.  3. The mitral valve is degenerative. No evidence of mitral valve regurgitation. No evidence of mitral stenosis.  4. The aortic valve has an indeterminant number of cusps. There is moderate calcification of the aortic valve. There is moderate thickening of the aortic valve. Aortic valve regurgitation is trivial. Aortic valve sclerosis/calcification is present, without any evidence of aortic stenosis.  5. The inferior vena cava was not well visualized. FINDINGS  Left Ventricle: Left ventricular ejection fraction, by estimation, is 60 to 65%. The left ventricle has normal function. The left ventricle has no regional wall motion abnormalities. The left ventricular internal cavity size was normal in size. There is  mild concentric left ventricular hypertrophy. Left ventricular diastolic parameters were normal. Normal left ventricular filling pressure. Right Ventricle: The right ventricular size is normal. No increase in right ventricular wall thickness. Right ventricular systolic function is normal. Tricuspid regurgitation signal is inadequate for assessing PA pressure. Left Atrium: Left atrial size was normal in size. Right Atrium: Right atrial size was normal in size. Pericardium: There is no evidence of pericardial effusion. Mitral Valve: The mitral valve is degenerative in appearance. There is moderate thickening of the mitral valve leaflet(s). There is moderate calcification of the mitral valve leaflet(s). No evidence of mitral valve regurgitation. No evidence of mitral valve stenosis. Tricuspid Valve: The tricuspid valve is normal in structure. Tricuspid valve regurgitation is not demonstrated. No evidence of tricuspid stenosis. Aortic Valve: The aortic valve has an indeterminant number of cusps. There is moderate calcification of the aortic valve. There is moderate thickening of the aortic valve.  Aortic valve regurgitation is trivial. Aortic regurgitation PHT measures 277 msec.  Aortic valve sclerosis/calcification is present, without any evidence of aortic stenosis. Pulmonic Valve: The pulmonic valve was normal in structure. Pulmonic valve regurgitation is not visualized. No evidence of pulmonic stenosis. Aorta: The aortic root is normal in size and structure. Venous: The inferior vena cava was not well visualized. IAS/Shunts: No atrial level shunt detected by color flow Doppler.  LEFT VENTRICLE PLAX 2D LVIDd:         4.10 cm      Diastology LVIDs:         2.50 cm      LV e' medial:    9.25 cm/s LV PW:         1.20 cm      LV E/e' medial:  7.3 LV IVS:        1.20 cm      LV e' lateral:   10.80 cm/s LVOT diam:     1.90 cm      LV E/e' lateral: 6.2 LV SV:         37 LV SV Index:   19 LVOT Area:     2.84 cm  LV Volumes (MOD) LV vol d, MOD A2C: 108.0 ml LV vol d, MOD A4C: 99.9 ml LV vol s, MOD A2C: 39.6 ml LV vol s, MOD A4C: 38.4 ml LV SV MOD A2C:     68.4 ml LV SV MOD A4C:     99.9 ml LV SV MOD BP:      66.2 ml RIGHT VENTRICLE RV Basal diam:  3.30 cm RV S prime:     8.50 cm/s TAPSE (M-mode): 1.1 cm LEFT ATRIUM             Index        RIGHT ATRIUM  Index LA diam:        3.50 cm 1.83 cm/m   RA Area:     15.90 cm LA Vol (A2C):   41.1 ml 21.51 ml/m  RA Volume:   41.90 ml  21.93 ml/m LA Vol (A4C):   38.5 ml 20.15 ml/m LA Biplane Vol: 39.8 ml 20.83 ml/m  AORTIC VALVE LVOT Vmax:   65.30 cm/s LVOT Vmean:  43.300 cm/s LVOT VTI:    0.129 m AI PHT:      277 msec  AORTA Ao Root diam: 2.90 cm MITRAL VALVE MV Area (PHT): 4.49 cm    SHUNTS MV Decel Time: 169 msec    Systemic VTI:  0.13 m MV E velocity: 67.30 cm/s  Systemic Diam: 1.90 cm MV A velocity: 50.60 cm/s MV E/A ratio:  1.33 Armanda Magicraci Turner MD Electronically signed by Armanda Magicraci Turner MD Signature Date/Time: 05/16/2022/2:09:37 PM    Final    DG Chest Portable 1 View  Result Date: 05/15/2022 CLINICAL DATA:  Shortness of breath.  Dialysis. EXAM: PORTABLE  CHEST 1 VIEW COMPARISON:  12/25/2021. FINDINGS: Left IJ approach dialysis catheter with tip projecting over the right atrium. Stable cardiac and mediastinal contours with postoperative changes of median sternotomy and CABG. Hazy retrocardiac opacity, favored to reflect atelectasis. No pleural effusion or pneumothorax. IMPRESSION: Hazy retrocardiac opacity, favored to reflect atelectasis. Electronically Signed   By: Orvan FalconerWalter  Wiggins M.D.   On: 05/15/2022 16:50    Microbiology: Results for orders placed or performed during the hospital encounter of 09/28/21  Aerobic/Anaerobic Culture w Gram Stain (surgical/deep wound)     Status: None   Collection Time: 10/03/21  9:36 AM   Specimen: Wound  Result Value Ref Range Status   Specimen Description WOUND  Final   Special Requests NONE  Final   Gram Stain NO WBC SEEN NO ORGANISMS SEEN   Final   Culture   Final    No growth aerobically or anaerobically. Performed at [facility], [address] [place], [place] [address]    Report Status 10/08/2021 FINAL  Final  Culture, Respiratory w Gram Stain     Status: None   Collection Time: 10/11/21 10:58 AM   Specimen: Tracheal Aspirate; Respiratory  Result Value Ref Range Status   Specimen Description TRACHEAL ASPIRATE  Final   Special Requests NONE  Final   Gram Stain NO WBC SEEN NO ORGANISMS SEEN   Final   Culture   Final    NO GROWTH 2 DAYS Performed at [facility], [address] [place], [place] [address]    Report Status 10/14/2021 FINAL  Final  Culture, Respiratory w Gram Stain     Status: None   Collection Time: 10/14/21 11:13 AM   Specimen: Bronchoalveolar Lavage; Respiratory  Result Value Ref Range Status   Specimen Description BRONCHIAL ALVEOLAR LAVAGE  Final   Special Requests NONE  Final   Gram Stain   Final    FEW WBC PRESENT,BOTH PMN AND MONONUCLEAR NO ORGANISMS SEEN    Culture   Final    NO GROWTH 2 DAYS Performed at [facility], [address]  [place], [place] [address]    Report Status 10/16/2021 FINAL  Final  C Difficile Quick Screen (NO PCR Reflex)     Status: None   Collection Time: 10/16/21 10:19 AM   Specimen: STOOL  Result Value Ref Range Status   C Diff antigen NEGATIVE NEGATIVE Final   C Diff toxin NEGATIVE NEGATIVE  Final   C Diff interpretation No C. difficile detected.  Final    Comment: Performed at [facility], [address], [place], [place] [address]  Culture, Respiratory w Gram Stain     Status: None   Collection Time: 10/23/21  8:44 AM   Specimen: Tracheal Aspirate; Respiratory  Result Value Ref Range Status   Specimen Description TRACHEAL ASPIRATE  Final   Special Requests NONE  Final   Gram Stain   Final    MODERATE WBC PRESENT, PREDOMINANTLY PMN MODERATE SQUAMOUS EPITHELIAL CELLS PRESENT FEW GRAM POSITIVE COCCI IN CLUSTERS RARE YEAST    Culture   Final    RARE Normal respiratory flora-no Staph aureus or Pseudomonas seen Performed at [facility], [address] [place], [place] [address]    Report Status 10/26/2021 FINAL  Final  Culture, blood (Routine X 2) w Reflex to ID Panel     Status: None   Collection Time: 10/23/21 10:20 AM   Specimen: BLOOD  Result Value Ref Range Status   Specimen Description BLOOD LEFT ANTECUBITAL  Final   Special Requests   Final    BOTTLES DRAWN AEROBIC AND ANAEROBIC Blood Culture adequate volume   Culture   Final    NO GROWTH 5 DAYS Performed at [facility], [address], [place], [place] [address]    Report Status 10/28/2021 FINAL  Final  Culture, blood (Routine X 2) w Reflex to ID Panel     Status: None   Collection Time: 10/23/21 10:28 AM   Specimen: BLOOD  Result Value Ref Range Status   Specimen Description BLOOD LEFT ANTECUBITAL  Final   Special Requests   Final    BOTTLES DRAWN AEROBIC AND ANAEROBIC Blood Culture adequate volume   Culture   Final    NO GROWTH 5 DAYS Performed at [facility], [address], [place],  [place] [address]    Report Status 10/28/2021 FINAL  Final    Labs: CBC: Recent Labs  Lab 05/15/22 1602 05/15/22 1621  WBC 8.0  --   NEUTROABS 5.0  --   HGB 13.4 15.3  HCT 42.7 45.0  MCV 96.4  --   PLT 169  --    Basic Metabolic Panel: Recent Labs  Lab 05/15/22 1602 05/15/22 1621  NA 134* 135  K 4.4 4.4  CL 95* 100  CO2 23  --   GLUCOSE 87 87  BUN 27* 28*  CREATININE 6.29* 7.40*  CALCIUM 9.5  --   MG 2.0  --    Liver Function Tests: Recent Labs  Lab 05/15/22 1602  AST 20  ALT 11  ALKPHOS 129*  BILITOT 1.1  PROT 8.2*  ALBUMIN 4.4   CBG: Recent Labs  Lab 05/15/22 1953  GLUCAP 86    Discharge time spent: 45 minutes  Signed: Kathlen Mody, MD [facility] 05/16/2022

## 2022-05-16 NOTE — ED Notes (Signed)
Returned from echo. No complaints. Awaiting discharge.

## 2022-05-19 ENCOUNTER — Telehealth: Payer: Self-pay

## 2022-05-19 NOTE — Transitions of Care (Post Inpatient/ED Visit) (Signed)
   05/19/2022  Name: [name] MRN: [id number] DOB: 1960-04-10  Today's TOC FU Call Status: Today's TOC FU Call Status:: Unsuccessul Call (1st Attempt) Unsuccessful Call (1st Attempt) Date: 05/19/22  Attempted to reach the patient regarding the most recent Inpatient/ED visit.  Follow Up Plan: Additional outreach attempts will be made to reach the patient to complete the Transitions of Care (Post Inpatient/ED visit) call.   Agnes Lawrence, CMA (AAMA)  [facility] [phone]

## 2022-05-21 ENCOUNTER — Ambulatory Visit: Payer: Self-pay | Admitting: Family Medicine

## 2022-05-26 NOTE — Transitions of Care (Post Inpatient/ED Visit) (Signed)
   05/26/2022  Name: [name] MRN: [id number] DOB: 04-Apr-1960  Today's TOC FU Call Status: Today's TOC FU Call Status:: Unsuccessful Call (2nd Attempt) Unsuccessful Call (1st Attempt) Date: 05/19/22 Unsuccessful Call (2nd Attempt) Date: 05/26/22  Attempted to reach the patient regarding the most recent Inpatient/ED visit.  Follow Up Plan: Additional outreach attempts will be made to reach the patient to complete the Transitions of Care (Post Inpatient/ED visit) call.   Agnes Lawrence, CMA (AAMA)  [facility] [phone]

## 2022-05-29 ENCOUNTER — Other Ambulatory Visit (HOSPITAL_COMMUNITY): Payer: Self-pay

## 2022-05-29 MED ORDER — SEVELAMER CARBONATE 800 MG PO TABS
800.0000 mg | ORAL_TABLET | Freq: Three times a day (TID) | ORAL | 12 refills | Status: DC
  Filled 2022-05-29: qty 270, 90d supply, fill #0

## 2022-06-05 NOTE — Transitions of Care (Post Inpatient/ED Visit) (Signed)
   06/05/2022  Name: [name] MRN: [id number] DOB: 1961/01/29  Today's TOC FU Call Status: Today's TOC FU Call Status:: Unsuccessful Call (3rd Attempt) Unsuccessful Call (1st Attempt) Date: 05/19/22 Unsuccessful Call (2nd Attempt) Date: 05/26/22 Unsuccessful Call (3rd Attempt) Date: 06/05/22  Attempted to reach the patient regarding the most recent Inpatient/ED visit.  Follow Up Plan: No further outreach attempts will be made at this time. We have been unable to contact the patient.  Signature Agnes Lawrence, CMA (AAMA)  [facility] [phone]

## 2022-06-06 ENCOUNTER — Other Ambulatory Visit: Payer: Self-pay

## 2022-06-06 ENCOUNTER — Ambulatory Visit (INDEPENDENT_AMBULATORY_CARE_PROVIDER_SITE_OTHER): Payer: Medicare Other | Admitting: Family Medicine

## 2022-06-06 VITALS — BP 105/74 | HR 87 | Temp 97.7°F | Resp 16 | Wt 176.0 lb

## 2022-06-06 DIAGNOSIS — N186 End stage renal disease: Secondary | ICD-10-CM

## 2022-06-06 DIAGNOSIS — E1149 Type 2 diabetes mellitus with other diabetic neurological complication: Secondary | ICD-10-CM

## 2022-06-06 DIAGNOSIS — E785 Hyperlipidemia, unspecified: Secondary | ICD-10-CM | POA: Diagnosis not present

## 2022-06-06 DIAGNOSIS — Z8673 Personal history of transient ischemic attack (TIA), and cerebral infarction without residual deficits: Secondary | ICD-10-CM

## 2022-06-06 DIAGNOSIS — Z0289 Encounter for other administrative examinations: Secondary | ICD-10-CM

## 2022-06-06 DIAGNOSIS — I10 Essential (primary) hypertension: Secondary | ICD-10-CM

## 2022-06-06 DIAGNOSIS — Z992 Dependence on renal dialysis: Secondary | ICD-10-CM

## 2022-06-06 LAB — POCT GLYCOSYLATED HEMOGLOBIN (HGB A1C): Hemoglobin A1C: 6 % — AB (ref 4.0–5.6)

## 2022-06-06 NOTE — Progress Notes (Unsigned)
Patient is here for their 6 month follow-up Patient has no concerns today Care gaps have been discussed with patient  

## 2022-06-06 NOTE — Progress Notes (Unsigned)
Established Patient Office Visit  Subjective    Patient ID: [name], male    DOB: [birth year]  Age: 62 y.o. MRN: [id number]  CC:  Chief Complaint  Patient presents with   Follow-up   Diabetes   papers    HPI [name] presents for follow up of chronic med issues after almost a year ago having a stroke during a med procedure. Patient was unconscious for about 2 months. Patient reports great improvement but notes that he is right hand dominant and he has lost major function in his right hand   Outpatient Encounter Medications as of 06/06/2022  Medication Sig   acetaminophen (TYLENOL) 325 MG tablet Take 2 tablets (650 mg total) by mouth every 6 (six) hours as needed for mild pain, headache or fever. (Patient not taking: Reported on 05/16/2022)   albuterol (VENTOLIN HFA) 108 (90 Base) MCG/ACT inhaler Inhale 1-2 puffs into the lungs every 4 (four) hours as needed for wheezing or shortness of breath. (Patient not taking: Reported on 05/16/2022)   Amino Acids-Protein Hydrolys (FEEDING SUPPLEMENT, PRO-STAT 64,) LIQD Take 30 mLs by mouth 2 (two) times daily.   amiodarone (PACERONE) 200 MG tablet Take 1 tablet (200 mg total) by mouth daily.   amiodarone (PACERONE) 200 MG tablet Take 2 tablets (400 mg total) by mouth 2 (two) times daily for 9 days. Then take 1 tablet (200 mg total) by mouth daily.   apixaban (ELIQUIS) 5 MG TABS tablet Take 1 tablet (5 mg total) by mouth 2 (two) times daily.   ascorbic acid (VITAMIN C) 500 MG tablet Take 500 mg by mouth daily.   aspirin 81 MG chewable tablet Chew 1 tablet (81 mg total) by mouth daily. (Patient not taking: Reported on 05/16/2022)   atorvastatin (LIPITOR) 80 MG tablet Take 1 tablet (80 mg total) by mouth daily.   Continuous Blood Gluc Sensor (FREESTYLE LIBRE 2 SENSOR) MISC Utilize as directed q 14 days to monitor blood sugar.   folic acid (FOLVITE) 1 MG tablet Take 1 tablet (1 mg total) by mouth daily.   glucose blood (TRUE METRIX BLOOD GLUCOSE  TEST) test strip Use to check blood sugar three times daily.   guaifenesin (ROBITUSSIN) 100 MG/5ML syrup Take 200 mg by mouth every 4 (four) hours as needed for cough. (Patient not taking: Reported on 05/16/2022)   insulin aspart (NOVOLOG) 100 UNIT/ML injection Inject 0-6 Units into the skin every 4 (four) hours. CBG 70-150 ( 0 units) 151-200 (1 unit) 201-250 (2 units) 251-300 ( 3 units) 301-350 ( 4 units) 351-400 (5 units) >400 ( 6 units) (Patient not taking: Reported on 05/16/2022)   Insulin Pen Needle (TRUEPLUS 5-BEVEL PEN NEEDLES) 32G X 4 MM MISC Use to inject Basaglar once daily.   leptospermum manuka honey (MEDIHONEY) PSTE paste Apply 1 Application topically daily. Apply Medihoney to sacrum/bilat buttock wound Q day, then cover with gauze and abd pad and tape  Apply thin layer (3 mm) to wound. (Patient not taking: Reported on 05/16/2022)   metoprolol succinate (TOPROL-XL) 25 MG 24 hr tablet Take 1 tablet (25 mg total) by mouth daily.   midodrine (PROAMATINE) 10 MG tablet Take 0.5 tablets (5 mg total) by mouth 2 (two) times daily for blood pressure. (Patient not taking: Reported on 05/16/2022)   multivitamin (RENA-VIT) TABS tablet Take 1 tablet by mouth at bedtime. (Patient not taking: Reported on 05/16/2022)   Nutritional Supplements (FEEDING SUPPLEMENT, OSMOLITE 1.5 CAL,) LIQD Place 237 mLs into feeding tube 4 (  four) times daily. Bolus a full carton of Osmolite 1.5 (237 ml) formula via GRAVITY per PEG using a 50-60 cc syringe. Flush PEG with 20 ml before and after each bolus.  Pt is willing to take bolus tube feeds. He would prefer not to be asked each time whether he wants the bolus feed but instead would like it to be administered at the scheduled time.   oxyCODONE-acetaminophen (PERCOCET) 5-325 MG tablet Take 1 tablet by mouth every 6 (six) hours as needed for severe pain. (Patient not taking: Reported on 05/16/2022)   pantoprazole (PROTONIX) 40 MG tablet Take 1 tablet (40 mg total) by mouth daily.  (Patient not taking: Reported on 05/16/2022)   polyvinyl alcohol (LIQUIFILM TEARS) 1.4 % ophthalmic solution Place 2 drops into both eyes as needed for dry eyes. (Patient not taking: Reported on 05/16/2022)   sevelamer carbonate (RENVELA) 800 MG tablet Take 2 tablets (1,600 mg total) by mouth 3 (three) times daily with meals. (Patient not taking: Reported on 05/16/2022)   sevelamer carbonate (RENVELA) 800 MG tablet Take 1 tablet (800 mg total) by mouth 3 (three) times daily with meals.   thiamine (VITAMIN B-1) 100 MG tablet Take 1 tablet (100 mg total) by mouth daily. (Patient not taking: Reported on 05/16/2022)   TRUEplus Lancets 28G MISC Use to check blood sugar three times daily.   zinc gluconate 50 MG tablet Take 50 mg by mouth daily. (Patient not taking: Reported on 05/16/2022)   No facility-administered encounter medications on file as of 06/06/2022.    Past Medical History:  Diagnosis Date   AKI (acute kidney injury) [facility])    pt unaware of this   Anginal pain (HCC)    CAD (coronary artery disease)    a. 03/2015 NSTEMI: LHC with severe 3V CAD  (70% mid RCA, 95% OM1, 90% distal LCx, 90% OM3, 80% prox LAD and 90% ost D1) s/p DES to mLAD w/ small dissction Rx with DES, staged ost Ramus PCI/DES and dLCx s/p PCI/DES    Chest pain 12/24/2020   Diabetes mellitus type 2 in obese    Diverticulosis    Dyspnea    Dyspnea on exertion 03/16/2015   Dyspnea on exertion   ESRD (end stage renal disease) on dialysis [facility])    Family history of adverse reaction to anesthesia    patient father- pt states after anesthesia his father "developed dementia"   GERD (gastroesophageal reflux disease)    Hypercholesteremia    Hypertension associated with diabetes (HCC) 03/16/2015   hypertension   NSTEMI (non-ST elevated myocardial infarction) (HCC) 03/17/2015   Obesity    Stroke (HCC) 2022   pt states he had a "mini stroke" during cardiac catheterization   Tobacco abuse     Past Surgical History:  Procedure  Laterality Date   APPLICATION OF WOUND VAC N/A 10/03/2021   Procedure: APPLICATION OF WOUND VAC;  Surgeon: Lovett Sox, MD;  [facility] OR;  Service: Thoracic;  Laterality: N/A;   AV FISTULA PLACEMENT Right 03/14/2022   Procedure: RIGHT ARM ARTERIOVENOUS (AV) FISTULA VERSUS ARTERIOVENOUS GRAFT CREATION;  Surgeon: Nada Libman, MD;  Location: [facility] OR;  Service: Vascular;  Laterality: Right;   CANNULATION FOR ECMO (EXTRACORPOREAL MEMBRANE OXYGENATION) N/A 09/29/2021   Procedure: CANNULATION FOR ECMO (EXTRACORPOREAL MEMBRANE OXYGENATION);  Surgeon: Corliss Skains, MD;  Location: [facility] OR;  Service: Open Heart Surgery;  Laterality: N/A;   CARDIAC CATHETERIZATION N/A 03/19/2015   Procedure: Left Heart Cath and Coronary Angiography;  Surgeon: Delton See  Allyson Sabal, MD;  [facility] INV[facility] LAB;  Service: Cardiovascular;  Laterality: N/A;   CARDIAC CATHETERIZATION N/A 03/20/2015   Procedure: Coronary Stent Intervention;  Surgeon: Runell Gess, MD;  [facility];  Service: Cardiovascular;  Laterality: N/A;   CARDIAC CATHETERIZATION N/A 03/22/2015   Procedure: Coronary Stent Intervention;  Surgeon: Runell Gess, MD;  [facility];  Service: Cardiovascular;  Laterality: N/A;   CORONARY ARTERY BYPASS GRAFT N/A 09/12/2021   Procedure: CORONARY ARTERY BYPASS GRAFTING (CABG) X 5 USING LEFT INTERNAL MAMMARY ARTERY AND ENDOSCOPICALLY HARVESTED RIGHT GREATER SAPHENOUS VEIN.;  Surgeon: Alleen Borne, MD;  [facility];  Service: Open Heart Surgery;  Laterality: N/A;   EXPLORATION POST OPERATIVE OPEN HEART N/A 09/30/2021   Procedure: EXPLORATION POST OPERATIVE OPEN HEART WASHOUT;  Surgeon: Corliss Skains, MD;  [facility];  Service: Open Heart Surgery;  Laterality: N/A;   IR FLUORO GUIDE CV LINE LEFT  11/04/2021   IR FLUORO GUIDE CV LINE LEFT  12/06/2021   IR FLUORO GUIDE CV LINE LEFT  04/11/2022   IR GASTROSTOMY TUBE MOD SED  11/15/2021   IR GASTROSTOMY TUBE  MOD SED  11/20/2021   IR GASTROSTOMY TUBE REMOVAL  01/20/2022   IR US GUIDE VASC ACCESS LEFT  11/04/2021   IR VENOCAVAGRAM SVC  12/06/2021   IR VENOCAVAGRAM SVC  04/11/2022   LEFT HEART CATH AND CORONARY ANGIOGRAPHY N/A 12/25/2020   Procedure: LEFT HEART CATH AND CORONARY ANGIOGRAPHY;  Surgeon: [name], MD;  [facility];  Service: Cardiovascular;  Laterality: N/A;   LEFT HEART CATH AND CORONARY ANGIOGRAPHY N/A 12/26/2020   Procedure: LEFT HEART CATH AND CORONARY ANGIOGRAPHY;  Surgeon: [name], MD;  [facility];  Service: Cardiovascular;  Laterality: N/A;   MEDIASTINAL EXPLORATION N/A 10/03/2021   Procedure: MEDIASTINAL WASHOUT;  Surgeon: Lovett Sox, MD;  [facility];  Service: Thoracic;  Laterality: N/A;  PUMP STANDBY   MEDIASTINAL EXPLORATION N/A 10/12/2021   Procedure: MEDIASTINAL WASHOUT;  Surgeon: Alleen Borne, MD;  [facility];  Service: Thoracic;  Laterality: N/A;   PERCUTANEOUS TRACHEOSTOMY N/A 10/17/2021   Procedure: PERCUTANEOUS TRACHEOSTOMY USING SHILEY FLEXIBLE 8 mm CUFFED TRACH.;  Surgeon: Lorin Glass, MD;  Location: [facility] OR;  Service: Pulmonary;  Laterality: N/A;  Percutaneous tracheostomy   PERICARDIAL WINDOW Right 09/28/2021   Procedure: PERICARDIAL WINDOW;  Surgeon: Corliss Skains, MD;  [facility];  Service: Thoracic;  Laterality: Right;  Right VATS.  Lazy lateral.  double lumen ET tube   PLACEMENT OF IMPELLA LEFT VENTRICULAR ASSIST DEVICE N/A 10/07/2021   Procedure: INSERTION OF RIGHT AXILLARY IMPELLA, DECANNULATION OF ECMO, AND MEDIASTINAL WASHOUT;  Surgeon: Alleen Borne, MD;  [facility];  Service: Open Heart Surgery;  Laterality: N/A;  Open right axillary with 8 mm Hemashield Platinum Vascular graft.   RADIOLOGY WITH ANESTHESIA N/A 11/20/2021   Procedure: G-Tube Placement;  Surgeon: Simonne Come, MD;  Location[facility] OR;  Service: Radiology;  Laterality: N/A;   REMOVAL OF IMPELLA LEFT VENTRICULAR ASSIST  DEVICE Right 10/17/2021   Procedure: REMOVAL OF IMPELLA LEFT VENTRICULAR ASSIST DEVICE;  Surgeon: Alleen Borne, MD;  [facility];  Service: Open Heart Surgery;  Laterality: Right;   STERNAL CLOSURE N/A 10/12/2021   Procedure: STERNAL CLOSURE;  Surgeon: Alleen Borne, MD;  [facility];  Service: Thoracic;  Laterality: N/A;   TEE WITHOUT CARDIOVERSION N/A 09/12/2021  Procedure: TRANSESOPHAGEAL ECHOCARDIOGRAM (TEE);  Surgeon: Alleen Borne, MD;  Location: [facility] OR;  Service: Open Heart Surgery;  Laterality: N/A;   TEE WITHOUT CARDIOVERSION N/A 09/29/2021   Procedure: TRANSESOPHAGEAL ECHOCARDIOGRAM (TEE);  Surgeon: Corliss Skains, MD;  Location: [facility] OR;  Service: Open Heart Surgery;  Laterality: N/A;   TEE WITHOUT CARDIOVERSION N/A 10/03/2021   Procedure: TRANSESOPHAGEAL ECHOCARDIOGRAM (TEE);  Surgeon: Lovett Sox, MD;  Location: [facility] OR;  Service: Thoracic;  Laterality: N/A;   TEE WITHOUT CARDIOVERSION  10/07/2021   Procedure: TRANSESOPHAGEAL ECHOCARDIOGRAM (TEE);  Surgeon: Alleen Borne, MD;  Location: [facility] OR;  Service: Open Heart Surgery;;   TEE WITHOUT CARDIOVERSION N/A 10/12/2021   Procedure: TRANSESOPHAGEAL ECHOCARDIOGRAM (TEE);  Surgeon: Alleen Borne, MD;  Location: [facility] OR;  Service: Thoracic;  Laterality: N/A;   TEE WITHOUT CARDIOVERSION N/A 10/17/2021   Procedure: TRANSESOPHAGEAL ECHOCARDIOGRAM (TEE);  Surgeon: Alleen Borne, MD;  Location: [facility] OR;  Service: Open Heart Surgery;  Laterality: N/A;   TESTICLE SURGERY  1970   pt states testicle was ascended and had to be "pulled down"   TONSILLECTOMY     as a child    No family history on file.  Social History   Socioeconomic History   Marital status: Legally Separated    Spouse name: Not on file   Number of children: 0   Years of education: Not on file   Highest education level: Not on file  Occupational History   Not on file  Tobacco Use   Smoking status: Former    Packs/day: .5    Types: Cigarettes    Quit date:  2017    Years since quitting: 7.3   Smokeless tobacco: Never  Vaping Use   Vaping Use: Former   Start date: 10/14/2017   Quit date: 09/09/2021  Substance and Sexual Activity   Alcohol use: Not Currently    Comment: very occasional, maybe a beer or mixed drink once every few months   Drug use: Not Currently   Sexual activity: Not on file  Other Topics Concern   Not on file  Social History Narrative   Not on file   Social Determinants of Health   Financial Resource Strain: Low Risk  (12/06/2021)   Overall Financial Resource Strain (CARDIA)    Difficulty of Paying Living Expenses: Not hard at all  Food Insecurity: No Food Insecurity (12/06/2021)   Hunger Vital Sign    Worried About Running Out of Food in the Last Year: Never true    Ran Out of Food in the Last Year: Never true  Transportation Needs: No Transportation Needs (12/06/2021)   PRAPARE - Administrator, Civil Service (Medical): No    Lack of Transportation (Non-Medical): No  Physical Activity: Not on file  Stress: Not on file  Social Connections: Not on file  Intimate Partner Violence: Not At Risk (12/06/2021)   Humiliation, Afraid, Rape, and Kick questionnaire    Fear of Current or Ex-Partner: No    Emotionally Abused: No    Physically Abused: No    Sexually Abused: No    ROS      Objective    There were no vitals taken for this visit.  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   Tommie Raymond, MD

## 2022-06-09 ENCOUNTER — Encounter: Payer: Self-pay | Admitting: Family Medicine

## 2022-06-10 ENCOUNTER — Other Ambulatory Visit (HOSPITAL_BASED_OUTPATIENT_CLINIC_OR_DEPARTMENT_OTHER): Payer: Self-pay

## 2022-06-11 ENCOUNTER — Encounter: Payer: Self-pay | Admitting: Surgery

## 2022-06-11 ENCOUNTER — Ambulatory Visit: Payer: Self-pay | Admitting: Surgery

## 2022-06-27 ENCOUNTER — Telehealth: Payer: Self-pay | Admitting: *Deleted

## 2022-06-27 NOTE — Telephone Encounter (Signed)
Call place to [facility] with information that patient will be on hemodialysis for his entire life, per provider   Copied from CRM [id number]. Topic: General - Inquiry >> Jun 26, 2022  9:02 AM De Blanch wrote: Reason for CRM: Mindy, a nurse from [facility], stated she received updated office notes and other information from PCP. However, has some questions and needs to clarify if pt is still in hemodialysis.  Mindy is requesting a callback. Please advise.

## 2022-07-14 ENCOUNTER — Other Ambulatory Visit: Payer: Self-pay | Admitting: Family Medicine

## 2022-07-14 NOTE — Telephone Encounter (Signed)
Medication Refill - Medication: atorvastatin (LIPITOR) 80 MG tablet   Has the patient contacted their pharmacy? Yes.   (Agent: If no, request that the patient contact the pharmacy for the refill. If patient does not wish to contact the pharmacy document the reason why and proceed with request.) (Agent: If yes, when and what did the pharmacy advise?)  Preferred Pharmacy (with phone number or street name):  [facility]  [address], [address] [place] [address]  Phone: [phone] Fax: [phone]   Has the patient been seen for an appointment in the last year OR does the patient have an upcoming appointment? Yes.    Agent: Please be advised that RX refills may take up to 3 business days. We ask that you follow-up with your pharmacy.

## 2022-07-15 NOTE — Telephone Encounter (Signed)
Requested medication (s) are due for refill today - yes  Requested medication (s) are on the active medication list -yes  Future visit scheduled -yes  Last refill: 09/06/21 #30 2RF  Notes to clinic: fails lab protocol- over 1 year- 03/12/21  Requested Prescriptions  Pending Prescriptions Disp Refills   atorvastatin (LIPITOR) 80 MG tablet 30 tablet 2    Sig: Take 1 tablet (80 mg total) by mouth daily.     Cardiovascular:  Antilipid - Statins Failed - 07/14/2022  5:00 PM      Failed - Lipid Panel in normal range within the last 12 months    Cholesterol  Date Value Ref Range Status  03/12/2021 250 (H) 0 - 200 mg/dL Final   LDL Cholesterol  Date Value Ref Range Status  03/12/2021 UNABLE TO CALCULATE IF TRIGLYCERIDE OVER 400 mg/dL 0 - 99 mg/dL Final    Comment:           Total Cholesterol/HDL:CHD Risk Coronary Heart Disease Risk Table                     Men   Women  1/2 Average Risk   3.4   3.3  Average Risk       5.0   4.4  2 X Average Risk   9.6   7.1  3 X Average Risk  23.4   11.0        Use the calculated Patient Ratio above and the CHD Risk Table to determine the patient's CHD Risk.        ATP III CLASSIFICATION (LDL):  <100     mg/dL   Optimal  161-096  mg/dL   Near or Above                    Optimal  130-159  mg/dL   Borderline  045-409  mg/dL   High  >811     mg/dL   Very High Performed at [facility], [address] [place], [place] [address]    Direct LDL  Date Value Ref Range Status  03/12/2021 114.3 (H) 0 - 99 mg/dL Final    Comment:    Performed at [facility], [address], [place], [place] [address]   HDL  Date Value Ref Range Status  03/12/2021 31 (L) >40 mg/dL Final   Triglycerides  Date Value Ref Range Status  03/12/2021 538 (H) <150 mg/dL Final         Passed - Patient is not pregnant      Passed - Valid encounter within last 12 months    Recent Outpatient Visits           1 month ago Diabetes mellitus type 2 with  neurological manifestations [facility])   [place] [facility], MD   10 months ago Diabetes mellitus type 2 with neurological manifestations [facility])   [place] [facility] [place], Cornelius Moras, RPH-CPP   1 year ago Diabetes mellitus type 2 with neurological manifestations [facility])   [place] [facility] [place], [place] L, RPH-CPP   1 year ago Diabetes mellitus type 2 with neurological manifestations [facility])   [place] [facility] [place], Faith L, RPH-CPP   1 year ago Uncontrolled type 2 diabetes mellitus with hypoglycemia and coma [facility])   [place] [facility], MD  Future Appointments             In 1 month [name], MD [facility]               Requested Prescriptions  Pending Prescriptions Disp Refills   atorvastatin (LIPITOR) 80 MG tablet 30 tablet 2    Sig: Take 1 tablet (80 mg total) by mouth daily.     Cardiovascular:  Antilipid - Statins Failed - 07/14/2022  5:00 PM      Failed - Lipid Panel in normal range within the last 12 months    Cholesterol  Date Value Ref Range Status  03/12/2021 250 (H) 0 - 200 mg/dL Final   LDL Cholesterol  Date Value Ref Range Status  03/12/2021 UNABLE TO CALCULATE IF TRIGLYCERIDE OVER 400 mg/dL 0 - 99 mg/dL Final    Comment:           Total Cholesterol/HDL:CHD Risk Coronary Heart Disease Risk Table                     Men   Women  1/2 Average Risk   3.4   3.3  Average Risk       5.0   4.4  2 X Average Risk   9.6   7.1  3 X Average Risk  23.4   11.0        Use the calculated Patient Ratio above and the CHD Risk Table to determine the patient's CHD Risk.        ATP III CLASSIFICATION (LDL):  <100     mg/dL   Optimal  295-621  mg/dL   Near or Above                    Optimal  130-159  mg/dL   Borderline  308-657  mg/dL   High  >846      mg/dL   Very High Performed at [facility], [address], [place], [place] [address]    Direct LDL  Date Value Ref Range Status  03/12/2021 114.3 (H) 0 - 99 mg/dL Final    Comment:    Performed at [facility], [address], [place], [place] [address]   HDL  Date Value Ref Range Status  03/12/2021 31 (L) >40 mg/dL Final   Triglycerides  Date Value Ref Range Status  03/12/2021 538 (H) <150 mg/dL Final         Passed - Patient is not pregnant      Passed - Valid encounter within last 12 months    Recent Outpatient Visits           1 month ago Diabetes mellitus type 2 with neurological manifestations [facility])   [place] [facility], MD   10 months ago Diabetes mellitus type 2 with neurological manifestations [facility])   [place] [facility] [place], Cornelius Moras, RPH-CPP   1 year ago Diabetes mellitus type 2 with neurological manifestations [facility])   [place] [facility] [place], [place] L, RPH-CPP   1 year ago Diabetes mellitus type 2 with neurological manifestations [facility])   [place] [facility] [place], [place] L, RPH-CPP   1 year ago Uncontrolled type 2 diabetes mellitus with hypoglycemia and coma [facility])   [place] [facility], MD  Future Appointments             In 1 month [name], MD [facility]

## 2022-07-16 ENCOUNTER — Other Ambulatory Visit: Payer: Self-pay | Admitting: Family Medicine

## 2022-07-16 ENCOUNTER — Ambulatory Visit: Payer: Medicare Other | Admitting: Surgery

## 2022-07-16 ENCOUNTER — Other Ambulatory Visit: Payer: Self-pay

## 2022-07-16 ENCOUNTER — Encounter: Payer: Self-pay | Admitting: Surgery

## 2022-07-16 VITALS — BP 108/73 | HR 83 | Resp 18 | Ht 67.0 in | Wt 186.0 lb

## 2022-07-16 DIAGNOSIS — Z9889 Other specified postprocedural states: Secondary | ICD-10-CM | POA: Diagnosis not present

## 2022-07-16 DIAGNOSIS — Z951 Presence of aortocoronary bypass graft: Secondary | ICD-10-CM | POA: Diagnosis not present

## 2022-07-16 MED ORDER — ATORVASTATIN CALCIUM 80 MG PO TABS
80.0000 mg | ORAL_TABLET | Freq: Every day | ORAL | 2 refills | Status: DC
  Filled 2022-07-16: qty 90, 90d supply, fill #0

## 2022-07-16 NOTE — Progress Notes (Signed)
    HPI: ***  Current Outpatient Medications  Medication Sig Dispense Refill   acetaminophen (TYLENOL) 325 MG tablet Take 2 tablets (650 mg total) by mouth every 6 (six) hours as needed for mild pain, headache or fever.     Amino Acids-Protein Hydrolys (FEEDING SUPPLEMENT, PRO-STAT 64,) LIQD Take 30 mLs by mouth 2 (two) times daily.     amiodarone (PACERONE) 200 MG tablet Take 1 tablet (200 mg total) by mouth daily. 30 tablet 1   apixaban (ELIQUIS) 5 MG TABS tablet Take 1 tablet (5 mg total) by mouth 2 (two) times daily. 60 tablet 2   aspirin 81 MG chewable tablet Chew 1 tablet (81 mg total) by mouth daily.     atorvastatin (LIPITOR) 80 MG tablet Take 1 tablet (80 mg total) by mouth daily. 30 tablet 2   Continuous Blood Gluc Sensor (FREESTYLE LIBRE 2 SENSOR) MISC Utilize as directed q 14 days to monitor blood sugar. 2 each 1   folic acid (FOLVITE) 1 MG tablet Take 1 tablet (1 mg total) by mouth daily.     glucose blood (TRUE METRIX BLOOD GLUCOSE TEST) test strip Use to check blood sugar three times daily. 100 each 2   insulin aspart (NOVOLOG) 100 UNIT/ML injection Inject 0-6 Units into the skin every 4 (four) hours. CBG 70-150 ( 0 units) 151-200 (1 unit) 201-250 (2 units) 251-300 ( 3 units) 301-350 ( 4 units) 351-400 (5 units) >400 ( 6 units) 10 mL 11   Insulin Pen Needle (TRUEPLUS 5-BEVEL PEN NEEDLES) 32G X 4 MM MISC Use to inject Basaglar once daily. 100 each 3   metoprolol succinate (TOPROL-XL) 25 MG 24 hr tablet Take 1 tablet (25 mg total) by mouth daily. 30 tablet 2   multivitamin (RENA-VIT) TABS tablet Take 1 tablet by mouth at bedtime.  0   pantoprazole (PROTONIX) 40 MG tablet Take 1 tablet (40 mg total) by mouth daily.     sevelamer carbonate (RENVELA) 800 MG tablet Take 2 tablets (1,600 mg total) by mouth 3 (three) times daily with meals. 540 tablet 12   sevelamer carbonate (RENVELA) 800 MG tablet Take 1 tablet (800 mg total) by mouth 3 (three) times daily with meals. 270 tablet  12   TRUEplus Lancets 28G MISC Use to check blood sugar three times daily. 100 each 2   No current facility-administered medications for this visit.     Physical Exam: ***  Diagnostic Tests: ***  Impression: ***  Plan: ***   Alleen Borne, MD [facility] [phone]

## 2022-07-24 ENCOUNTER — Other Ambulatory Visit: Payer: Self-pay | Admitting: *Deleted

## 2022-07-24 DIAGNOSIS — N186 End stage renal disease: Secondary | ICD-10-CM

## 2022-08-01 ENCOUNTER — Ambulatory Visit (HOSPITAL_COMMUNITY)
Admission: RE | Admit: 2022-08-01 | Discharge: 2022-08-01 | Disposition: A | Discharge status: Home / Self Care | Payer: Medicare Other | Source: Ambulatory Visit | Attending: Vascular Surgery | Admitting: Vascular Surgery

## 2022-08-01 ENCOUNTER — Ambulatory Visit (INDEPENDENT_AMBULATORY_CARE_PROVIDER_SITE_OTHER): Payer: Medicare Other | Admitting: Physician Assistant

## 2022-08-01 VITALS — BP 100/66 | HR 75 | Temp 97.7°F | Resp 15 | Ht 67.0 in | Wt 185.0 lb

## 2022-08-01 DIAGNOSIS — N186 End stage renal disease: Secondary | ICD-10-CM | POA: Insufficient documentation

## 2022-08-05 ENCOUNTER — Telehealth: Payer: Self-pay

## 2022-08-05 ENCOUNTER — Encounter: Payer: Self-pay | Admitting: Physician Assistant

## 2022-08-05 NOTE — Telephone Encounter (Signed)
Attempted to reach patient to schedule procedure. Left VM to return call.

## 2022-08-05 NOTE — Progress Notes (Signed)
Office Note     CC:  follow up Requesting Provider:  Arita Miss, MD  HPI: [name] is a 62 y.o. (11-03-1960) male who presents status post for stage basilic vein fistula creation by Dr. Myra Gianotti in February of this year.  Patient missed his postoperative appointment.  He is currently dialyzing via right IJ TDC without complication.  He denies any steal symptoms in his right hand.  He does not take any blood thinners.   Past Medical History:  Diagnosis Date   AKI (acute kidney injury) [facility])    pt unaware of this   Anginal pain (HCC)    CAD (coronary artery disease)    a. 03/2015 NSTEMI: LHC with severe 3V CAD  (70% mid RCA, 95% OM1, 90% distal LCx, 90% OM3, 80% prox LAD and 90% ost D1) s/p DES to mLAD w/ small dissction Rx with DES, staged ost Ramus PCI/DES and dLCx s/p PCI/DES    Chest pain 12/24/2020   Diabetes mellitus type 2 in obese    Diverticulosis    Dyspnea    Dyspnea on exertion 03/16/2015   Dyspnea on exertion   ESRD (end stage renal disease) on dialysis [facility])    Family history of adverse reaction to anesthesia    patient father- pt states after anesthesia his father "developed dementia"   GERD (gastroesophageal reflux disease)    Hypercholesteremia    Hypertension associated with diabetes (HCC) 03/16/2015   hypertension   NSTEMI (non-ST elevated myocardial infarction) (HCC) 03/17/2015   Obesity    Stroke (HCC) 2022   pt states he had a "mini stroke" during cardiac catheterization   Tobacco abuse     Past Surgical History:  Procedure Laterality Date   APPLICATION OF WOUND VAC N/A 10/03/2021   Procedure: APPLICATION OF WOUND VAC;  Surgeon: Lovett Sox, MD;  Location: [facility] OR;  Service: Thoracic;  Laterality: N/A;   AV FISTULA PLACEMENT Right 03/14/2022   Procedure: RIGHT ARM ARTERIOVENOUS (AV) FISTULA VERSUS ARTERIOVENOUS GRAFT CREATION;  Surgeon: Nada Libman, MD;  Location: [facility] OR;  Service: Vascular;  Laterality: Right;   CANNULATION FOR ECMO  (EXTRACORPOREAL MEMBRANE OXYGENATION) N/A 09/29/2021   Procedure: CANNULATION FOR ECMO (EXTRACORPOREAL MEMBRANE OXYGENATION);  Surgeon: Corliss Skains, MD;  Location: [facility] OR;  Service: Open Heart Surgery;  Laterality: N/A;   CARDIAC CATHETERIZATION N/A 03/19/2015   Procedure: Left Heart Cath and Coronary Angiography;  Surgeon: Runell Gess, MD;  Location: [facility] INVASIVE CV LAB;  Service: Cardiovascular;  Laterality: N/A;   CARDIAC CATHETERIZATION N/A 03/20/2015   Procedure: Coronary Stent Intervention;  Surgeon: Runell Gess, MD;  Location: [facility] INVASIVE CV LAB;  Service: Cardiovascular;  Laterality: N/A;   CARDIAC CATHETERIZATION N/A 03/22/2015   Procedure: Coronary Stent Intervention;  Surgeon: Runell Gess, MD;  Location: [facility] INVASIVE CV LAB;  Service: Cardiovascular;  Laterality: N/A;   CORONARY ARTERY BYPASS GRAFT N/A 09/12/2021   Procedure: CORONARY ARTERY BYPASS GRAFTING (CABG) X 5 USING LEFT INTERNAL MAMMARY ARTERY AND ENDOSCOPICALLY HARVESTED RIGHT GREATER SAPHENOUS VEIN.;  Surgeon: Alleen Borne, MD;  Location: [facility] OR;  Service: Open Heart Surgery;  Laterality: N/A;   EXPLORATION POST OPERATIVE OPEN HEART N/A 09/30/2021   Procedure: EXPLORATION POST OPERATIVE OPEN HEART WASHOUT;  Surgeon: Corliss Skains, MD;  Location: [facility] OR;  Service: Open Heart Surgery;  Laterality: N/A;   IR FLUORO GUIDE CV LINE LEFT  11/04/2021   IR FLUORO GUIDE CV LINE LEFT  12/06/2021   IR FLUORO  GUIDE CV LINE LEFT  04/11/2022   IR GASTROSTOMY TUBE MOD SED  11/15/2021   IR GASTROSTOMY TUBE MOD SED  11/20/2021   IR GASTROSTOMY TUBE REMOVAL  01/20/2022   IR US GUIDE VASC ACCESS LEFT  11/04/2021   IR VENOCAVAGRAM SVC  12/06/2021   IR VENOCAVAGRAM SVC  04/11/2022   LEFT HEART CATH AND CORONARY ANGIOGRAPHY N/A 12/25/2020   Procedure: LEFT HEART CATH AND CORONARY ANGIOGRAPHY;  Surgeon: [name], MD;  [facility]: [facility] INVASIVE CV LAB;  Service: Cardiovascular;  Laterality: N/A;   LEFT HEART CATH AND CORONARY  ANGIOGRAPHY N/A 12/26/2020   Procedure: LEFT HEART CATH AND CORONARY ANGIOGRAPHY;  Surgeon: [name], MD;  [facility]: [facility] INVASIVE CV LAB;  Service: Cardiovascular;  Laterality: N/A;   MEDIASTINAL EXPLORATION N/A 10/03/2021   Procedure: MEDIASTINAL WASHOUT;  Surgeon: Lovett Sox, MD;  [facility]: [facility] OR;  Service: Thoracic;  Laterality: N/A;  PUMP STANDBY   MEDIASTINAL EXPLORATION N/A 10/12/2021   Procedure: MEDIASTINAL WASHOUT;  Surgeon: Alleen Borne, MD;  [facility]: [facility];  Service: Thoracic;  Laterality: N/A;   PERCUTANEOUS TRACHEOSTOMY N/A 10/17/2021   Procedure: PERCUTANEOUS TRACHEOSTOMY USING SHILEY FLEXIBLE 8 mm CUFFED TRACH.;  Surgeon: Lorin Glass, MD;  Location: [facility] OR;  Service: Pulmonary;  Laterality: N/A;  Percutaneous tracheostomy   PERICARDIAL WINDOW Right 09/28/2021   Procedure: PERICARDIAL WINDOW;  Surgeon: Corliss Skains, MD;  Location: [facility] OR;  Service: Thoracic;  Laterality: Right;  Right VATS.  Lazy lateral.  double lumen ET tube   PLACEMENT OF IMPELLA LEFT VENTRICULAR ASSIST DEVICE N/A 10/07/2021   Procedure: INSERTION OF RIGHT AXILLARY IMPELLA, DECANNULATION OF ECMO, AND MEDIASTINAL WASHOUT;  Surgeon: Alleen Borne, MD;  Location: [facility] OR;  Service: Open Heart Surgery;  Laterality: N/A;  Open right axillary with 8 mm Hemashield Platinum Vascular graft.   RADIOLOGY WITH ANESTHESIA N/A 11/20/2021   Procedure: G-Tube Placement;  Surgeon: Simonne Come, MD;  Location: [facility] OR;  Service: Radiology;  Laterality: N/A;   REMOVAL OF IMPELLA LEFT VENTRICULAR ASSIST DEVICE Right 10/17/2021   Procedure: REMOVAL OF IMPELLA LEFT VENTRICULAR ASSIST DEVICE;  Surgeon: Alleen Borne, MD;  [facility]: [facility];  Service: Open Heart Surgery;  Laterality: Right;   STERNAL CLOSURE N/A 10/12/2021   Procedure: STERNAL CLOSURE;  Surgeon: Alleen Borne, MD;  Location: [facility] OR;  Service: Thoracic;  Laterality: N/A;   TEE WITHOUT CARDIOVERSION N/A 09/12/2021   Procedure: TRANSESOPHAGEAL ECHOCARDIOGRAM  (TEE);  Surgeon: Alleen Borne, MD;  Location: [facility] OR;  Service: Open Heart Surgery;  Laterality: N/A;   TEE WITHOUT CARDIOVERSION N/A 09/29/2021   Procedure: TRANSESOPHAGEAL ECHOCARDIOGRAM (TEE);  Surgeon: Corliss Skains, MD;  Location: [facility] OR;  Service: Open Heart Surgery;  Laterality: N/A;   TEE WITHOUT CARDIOVERSION N/A 10/03/2021   Procedure: TRANSESOPHAGEAL ECHOCARDIOGRAM (TEE);  Surgeon: Lovett Sox, MD;  Location: [facility] OR;  Service: Thoracic;  Laterality: N/A;   TEE WITHOUT CARDIOVERSION  10/07/2021   Procedure: TRANSESOPHAGEAL ECHOCARDIOGRAM (TEE);  Surgeon: Alleen Borne, MD;  Location: [facility] OR;  Service: Open Heart Surgery;;   TEE WITHOUT CARDIOVERSION N/A 10/12/2021   Procedure: TRANSESOPHAGEAL ECHOCARDIOGRAM (TEE);  Surgeon: Alleen Borne, MD;  Location: [facility] OR;  Service: Thoracic;  Laterality: N/A;   TEE WITHOUT CARDIOVERSION N/A 10/17/2021   Procedure: TRANSESOPHAGEAL ECHOCARDIOGRAM (TEE);  Surgeon: Alleen Borne, MD;  Location: [facility] OR;  Service: Open Heart Surgery;  Laterality: N/A;   TESTICLE SURGERY  1970   pt states testicle was  ascended and had to be "pulled down"   TONSILLECTOMY     as a child    Social History   Socioeconomic History   Marital status: Legally Separated    Spouse name: Not on file   Number of children: 0   Years of education: Not on file   Highest education level: Not on file  Occupational History   Not on file  Tobacco Use   Smoking status: Former    Packs/day: .5    Types: Cigarettes    Quit date: 2017    Years since quitting: 7.4   Smokeless tobacco: Never  Vaping Use   Vaping Use: Former   Start date: 10/14/2017   Quit date: 09/09/2021  Substance and Sexual Activity   Alcohol use: Not Currently    Comment: very occasional, maybe a beer or mixed drink once every few months   Drug use: Not Currently   Sexual activity: Not on file  Other Topics Concern   Not on file  Social History Narrative   Not on file   Social Determinants of  Health   Financial Resource Strain: Low Risk  (12/06/2021)   Overall Financial Resource Strain (CARDIA)    Difficulty of Paying Living Expenses: Not hard at all  Food Insecurity: No Food Insecurity (12/06/2021)   Hunger Vital Sign    Worried About Running Out of Food in the Last Year: Never true    Ran Out of Food in the Last Year: Never true  Transportation Needs: No Transportation Needs (12/06/2021)   PRAPARE - Administrator, Civil Service (Medical): No    Lack of Transportation (Non-Medical): No  Physical Activity: Not on file  Stress: Not on file  Social Connections: Not on file  Intimate Partner Violence: Not At Risk (12/06/2021)   Humiliation, Afraid, Rape, and Kick questionnaire    Fear of Current or Ex-Partner: No    Emotionally Abused: No    Physically Abused: No    Sexually Abused: No   History reviewed. No pertinent family history.  Current Outpatient Medications  Medication Sig Dispense Refill   acetaminophen (TYLENOL) 325 MG tablet Take 2 tablets (650 mg total) by mouth every 6 (six) hours as needed for mild pain, headache or fever.     Amino Acids-Protein Hydrolys (FEEDING SUPPLEMENT, PRO-STAT 64,) LIQD Take 30 mLs by mouth 2 (two) times daily.     amiodarone (PACERONE) 200 MG tablet Take 1 tablet (200 mg total) by mouth daily. 30 tablet 1   apixaban (ELIQUIS) 5 MG TABS tablet Take 1 tablet (5 mg total) by mouth 2 (two) times daily. 60 tablet 2   aspirin 81 MG chewable tablet Chew 1 tablet (81 mg total) by mouth daily.     atorvastatin (LIPITOR) 80 MG tablet Take 1 tablet (80 mg total) by mouth daily. 90 tablet 2   Continuous Blood Gluc Sensor (FREESTYLE LIBRE 2 SENSOR) MISC Utilize as directed q 14 days to monitor blood sugar. 2 each 1   folic acid (FOLVITE) 1 MG tablet Take 1 tablet (1 mg total) by mouth daily.     glucose blood (TRUE METRIX BLOOD GLUCOSE TEST) test strip Use to check blood sugar three times daily. 100 each 2   insulin aspart (NOVOLOG)  100 UNIT/ML injection Inject 0-6 Units into the skin every 4 (four) hours. CBG 70-150 ( 0 units) 151-200 (1 unit) 201-250 (2 units) 251-300 ( 3 units) 301-350 ( 4 units) 351-400 (5 units) >400 ( 6  units) 10 mL 11   Insulin Pen Needle (TRUEPLUS 5-BEVEL PEN NEEDLES) 32G X 4 MM MISC Use to inject Basaglar once daily. 100 each 3   metoprolol succinate (TOPROL-XL) 25 MG 24 hr tablet Take 1 tablet (25 mg total) by mouth daily. 30 tablet 2   multivitamin (RENA-VIT) TABS tablet Take 1 tablet by mouth at bedtime.  0   pantoprazole (PROTONIX) 40 MG tablet Take 1 tablet (40 mg total) by mouth daily.     sevelamer carbonate (RENVELA) 800 MG tablet Take 2 tablets (1,600 mg total) by mouth 3 (three) times daily with meals. 540 tablet 12   sevelamer carbonate (RENVELA) 800 MG tablet Take 1 tablet (800 mg total) by mouth 3 (three) times daily with meals. 270 tablet 12   TRUEplus Lancets 28G MISC Use to check blood sugar three times daily. 100 each 2   No current facility-administered medications for this visit.    No Known Allergies   REVIEW OF SYSTEMS:   [X]  denotes positive finding, [ ]  denotes negative finding Cardiac  Comments:  Chest pain or chest pressure:    Shortness of breath upon exertion:    Short of breath when lying flat:    Irregular heart rhythm:        Vascular    Pain in calf, thigh, or hip brought on by ambulation:    Pain in feet at night that wakes you up from your sleep:     Blood clot in your veins:    Leg swelling:         Pulmonary    Oxygen at home:    Productive cough:     Wheezing:         Neurologic    Sudden weakness in arms or legs:     Sudden numbness in arms or legs:     Sudden onset of difficulty speaking or slurred speech:    Temporary loss of vision in one eye:     Problems with dizziness:         Gastrointestinal    Blood in stool:     Vomited blood:         Genitourinary    Burning when urinating:     Blood in urine:        Psychiatric     Major depression:         Hematologic    Bleeding problems:    Problems with blood clotting too easily:        Skin    Rashes or ulcers:        Constitutional    Fever or chills:      PHYSICAL EXAMINATION:  Vitals:   08/01/22 1303  BP: 100/66  Pulse: 75  Resp: 15  Temp: 97.7 F (36.5 C)  TempSrc: Temporal  SpO2: 97%  Weight: 185 lb (83.9 kg)  Height: 5\' 7"  (1.702 m)    General:  WDWN in NAD; vital signs documented above Gait: Not observed HENT: WNL, normocephalic Pulmonary: normal non-labored breathing , without Rales, rhonchi,  wheezing Cardiac: regular HR Abdomen: soft, NT, no masses Skin: without rashes Vascular Exam/Pulses: Palpable thrill at the anastomosis in the right arm Extremities: Palpable right radial pulse; incision healed Musculoskeletal: no muscle wasting or atrophy  Neurologic: A&O X 3 Psychiatric:  The pt has Normal affect.   Non-Invasive Vascular Imaging:   Duplex demonstrating high velocity just beyond the anastomosis in the distal upper arm    ASSESSMENT/PLAN:: 62 y.o. male  status post right arm basilic vein fistula creation  -Patent right brachiobasilic fistula with palpable thrill at the anastomosis.  Duplex demonstrates concerning elevated velocities just beyond the anastomosis in the distal upper arm.  Plan was discussed with Dr. Myra Gianotti.  Plan will be right arm fistulogram with possible intervention as well as second stage basilic vein transposition using either C arm or OR 16..  This will be scheduled with Dr. Myra Gianotti in the near future.  Case was discussed with the patient including risks and he is agreeable to proceed.   Emilie Rutter, PA-C [facility] [phone]  Clinic MD:   Myra Gianotti

## 2022-08-06 NOTE — Telephone Encounter (Signed)
Left VM for patient to return call

## 2022-08-11 ENCOUNTER — Telehealth: Payer: Self-pay

## 2022-08-11 NOTE — Telephone Encounter (Signed)
Attempted to reach pt to schedule his surgery. Left VM asking him to return our call.

## 2022-08-12 ENCOUNTER — Other Ambulatory Visit: Payer: Self-pay

## 2022-08-12 DIAGNOSIS — N186 End stage renal disease: Secondary | ICD-10-CM

## 2022-08-12 NOTE — Telephone Encounter (Signed)
Contacted patient at [facility]. He agreed to schedule surgery on 7/19. Instructions to be faxed to dialysis center. Voiced understanding.

## 2022-08-18 NOTE — Progress Notes (Signed)
Initial neurology clinic note  [name] MRN: [id number] DOB: [birth year]  Referring provider: [name], MD  Primary care provider: [name], MD  Reason for consult:  stroke  Subjective:  This is [name], a 62 y.o. right-handed male with a medical history of stroke, atrial flutter, HTN, HLD, CAD s/p MI, DM, ESRD on dialysis, former smoker who presents to neurology clinic with history of stroke. The patient is alone today.  Patient had strokes in 2022 and 2023 per his report. Patient has not been able to use his right hand since the stroke in 2023. He has numbness and tingling in the hand since as well. He had numbness, tingling, and weakness in the left foot previously, but that improved. He has some dysarthria since the stroke as well. He had some speech therapy after the stroke, but only went one time. He also did some therapy for his hand, but insurance would not cover more. He mentions being in a coma for 2 months after an MI about 1 year ago.    He thinks his speech has worsened over the last couple of months, especially when trying to talk fast  Current medications: Eliquis 5 mg BID - he is supposed to be on but it is too expensive. He has not taken it for about 1 month. He also takes atorvastatin 80 mg daily.   MEDICATIONS:  Outpatient Encounter Medications as of 08/27/2022  Medication Sig   acetaminophen (TYLENOL) 325 MG tablet Take 2 tablets (650 mg total) by mouth every 6 (six) hours as needed for mild pain, headache or fever.   Amino Acids-Protein Hydrolys (FEEDING SUPPLEMENT, PRO-STAT 64,) LIQD Take 30 mLs by mouth 2 (two) times daily.   amiodarone (PACERONE) 200 MG tablet Take 1 tablet (200 mg total) by mouth daily.   apixaban (ELIQUIS) 5 MG TABS tablet Take 1 tablet (5 mg total) by mouth 2 (two) times daily.   aspirin 81 MG chewable tablet Chew 1 tablet (81 mg total) by mouth daily.   atorvastatin (LIPITOR) 80 MG tablet Take 1 tablet (80 mg total)  by mouth daily.   Continuous Blood Gluc Sensor (FREESTYLE LIBRE 2 SENSOR) MISC Utilize as directed q 14 days to monitor blood sugar.   folic acid (FOLVITE) 1 MG tablet Take 1 tablet (1 mg total) by mouth daily.   glucose blood (TRUE METRIX BLOOD GLUCOSE TEST) test strip Use to check blood sugar three times daily.   insulin aspart (NOVOLOG) 100 UNIT/ML injection Inject 0-6 Units into the skin every 4 (four) hours. CBG 70-150 ( 0 units) 151-200 (1 unit) 201-250 (2 units) 251-300 ( 3 units) 301-350 ( 4 units) 351-400 (5 units) >400 ( 6 units)   Insulin Pen Needle (TRUEPLUS 5-BEVEL PEN NEEDLES) 32G X 4 MM MISC Use to inject Basaglar once daily.   metoprolol succinate (TOPROL-XL) 25 MG 24 hr tablet Take 1 tablet (25 mg total) by mouth daily.   multivitamin (RENA-VIT) TABS tablet Take 1 tablet by mouth at bedtime.   pantoprazole (PROTONIX) 40 MG tablet Take 1 tablet (40 mg total) by mouth daily.   sevelamer carbonate (RENVELA) 800 MG tablet Take 2 tablets (1,600 mg total) by mouth 3 (three) times daily with meals.   sevelamer carbonate (RENVELA) 800 MG tablet Take 1 tablet (800 mg total) by mouth 3 (three) times daily with meals.   TRUEplus Lancets 28G MISC Use to check blood sugar three times daily.   No facility-administered encounter medications on  file as of 08/27/2022.    PAST MEDICAL HISTORY: Past Medical History:  Diagnosis Date   AKI (acute kidney injury) (HCC)    pt unaware of this   Anginal pain (HCC)    CAD (coronary artery disease)    a. 03/2015 NSTEMI: LHC with severe 3V CAD  (70% mid RCA, 95% OM1, 90% distal LCx, 90% OM3, 80% prox LAD and 90% ost D1) s/p DES to mLAD w/ small dissction Rx with DES, staged ost Ramus PCI/DES and dLCx s/p PCI/DES    Chest pain 12/24/2020   Diabetes mellitus type 2 in obese    Diverticulosis    Dyspnea    Dyspnea on exertion 03/16/2015   Dyspnea on exertion   ESRD (end stage renal disease) on dialysis [facility])    Family history of adverse reaction  to anesthesia    patient father- pt states after anesthesia his father "developed dementia"   GERD (gastroesophageal reflux disease)    Hypercholesteremia    Hypertension associated with diabetes (HCC) 03/16/2015   hypertension   NSTEMI (non-ST elevated myocardial infarction) (HCC) 03/17/2015   Obesity    Stroke (HCC) 2022   pt states he had a "mini stroke" during cardiac catheterization   Tobacco abuse     PAST SURGICAL HISTORY: Past Surgical History:  Procedure Laterality Date   APPLICATION OF WOUND VAC N/A 10/03/2021   Procedure: APPLICATION OF WOUND VAC;  Surgeon: Lovett Sox, MD;  Location: [facility] OR;  Service: Thoracic;  Laterality: N/A;   AV FISTULA PLACEMENT Right 03/14/2022   Procedure: RIGHT ARM ARTERIOVENOUS (AV) FISTULA VERSUS ARTERIOVENOUS GRAFT CREATION;  Surgeon: Nada Libman, MD;  Location: [facility] OR;  Service: Vascular;  Laterality: Right;   CANNULATION FOR ECMO (EXTRACORPOREAL MEMBRANE OXYGENATION) N/A 09/29/2021   Procedure: CANNULATION FOR ECMO (EXTRACORPOREAL MEMBRANE OXYGENATION);  Surgeon: Corliss Skains, MD;  Location: [facility] OR;  Service: Open Heart Surgery;  Laterality: N/A;   CARDIAC CATHETERIZATION N/A 03/19/2015   Procedure: Left Heart Cath and Coronary Angiography;  Surgeon: Runell Gess, MD;  Location: [facility] INVASIVE CV LAB;  Service: Cardiovascular;  Laterality: N/A;   CARDIAC CATHETERIZATION N/A 03/20/2015   Procedure: Coronary Stent Intervention;  Surgeon: Runell Gess, MD;  Location: [facility] INVASIVE CV LAB;  Service: Cardiovascular;  Laterality: N/A;   CARDIAC CATHETERIZATION N/A 03/22/2015   Procedure: Coronary Stent Intervention;  Surgeon: Runell Gess, MD;  Location: [facility] INVASIVE CV LAB;  Service: Cardiovascular;  Laterality: N/A;   CORONARY ARTERY BYPASS GRAFT N/A 09/12/2021   Procedure: CORONARY ARTERY BYPASS GRAFTING (CABG) X 5 USING LEFT INTERNAL MAMMARY ARTERY AND ENDOSCOPICALLY HARVESTED RIGHT GREATER SAPHENOUS VEIN.;  Surgeon: Alleen Borne,  MD;  Location: [facility] OR;  Service: Open Heart Surgery;  Laterality: N/A;   EXPLORATION POST OPERATIVE OPEN HEART N/A 09/30/2021   Procedure: EXPLORATION POST OPERATIVE OPEN HEART WASHOUT;  Surgeon: Corliss Skains, MD;  Location: [facility] OR;  Service: Open Heart Surgery;  Laterality: N/A;   IR FLUORO GUIDE CV LINE LEFT  11/04/2021   IR FLUORO GUIDE CV LINE LEFT  12/06/2021   IR FLUORO GUIDE CV LINE LEFT  04/11/2022   IR GASTROSTOMY TUBE MOD SED  11/15/2021   IR GASTROSTOMY TUBE MOD SED  11/20/2021   IR GASTROSTOMY TUBE REMOVAL  01/20/2022   IR US GUIDE VASC ACCESS LEFT  11/04/2021   IR VENOCAVAGRAM SVC  12/06/2021   IR VENOCAVAGRAM SVC  04/11/2022   LEFT HEART CATH AND CORONARY ANGIOGRAPHY N/A 12/25/2020   Procedure:  LEFT HEART CATH AND CORONARY ANGIOGRAPHY;  Surgeon: [name], MD;  Location: [facility] INVASIVE CV LAB;  Service: Cardiovascular;  Laterality: N/A;   LEFT HEART CATH AND CORONARY ANGIOGRAPHY N/A 12/26/2020   Procedure: LEFT HEART CATH AND CORONARY ANGIOGRAPHY;  Surgeon: [name], MD;  Location: [facility] INVASIVE CV LAB;  Service: Cardiovascular;  Laterality: N/A;   MEDIASTINAL EXPLORATION N/A 10/03/2021   Procedure: MEDIASTINAL WASHOUT;  Surgeon: Lovett Sox, MD;  Location: [facility] OR;  Service: Thoracic;  Laterality: N/A;  PUMP STANDBY   MEDIASTINAL EXPLORATION N/A 10/12/2021   Procedure: MEDIASTINAL WASHOUT;  Surgeon: Alleen Borne, MD;  Location: [facility];  Service: Thoracic;  Laterality: N/A;   PERCUTANEOUS TRACHEOSTOMY N/A 10/17/2021   Procedure: PERCUTANEOUS TRACHEOSTOMY USING SHILEY FLEXIBLE 8 mm CUFFED TRACH.;  Surgeon: Lorin Glass, MD;  Location: [facility] OR;  Service: Pulmonary;  Laterality: N/A;  Percutaneous tracheostomy   PERICARDIAL WINDOW Right 09/28/2021   Procedure: PERICARDIAL WINDOW;  Surgeon: Corliss Skains, MD;  Location: [facility] OR;  Service: Thoracic;  Laterality: Right;  Right VATS.  Lazy lateral.  double lumen ET tube   PLACEMENT OF IMPELLA LEFT VENTRICULAR ASSIST DEVICE  N/A 10/07/2021   Procedure: INSERTION OF RIGHT AXILLARY IMPELLA, DECANNULATION OF ECMO, AND MEDIASTINAL WASHOUT;  Surgeon: Alleen Borne, MD;  Location: [facility] OR;  Service: Open Heart Surgery;  Laterality: N/A;  Open right axillary with 8 mm Hemashield Platinum Vascular graft.   RADIOLOGY WITH ANESTHESIA N/A 11/20/2021   Procedure: G-Tube Placement;  Surgeon: Simonne Come, MD;  Location: [facility] OR;  Service: Radiology;  Laterality: N/A;   REMOVAL OF IMPELLA LEFT VENTRICULAR ASSIST DEVICE Right 10/17/2021   Procedure: REMOVAL OF IMPELLA LEFT VENTRICULAR ASSIST DEVICE;  Surgeon: Alleen Borne, MD;  Location: [facility] OR;  Service: Open Heart Surgery;  Laterality: Right;   STERNAL CLOSURE N/A 10/12/2021   Procedure: STERNAL CLOSURE;  Surgeon: Alleen Borne, MD;  Location: [facility] OR;  Service: Thoracic;  Laterality: N/A;   TEE WITHOUT CARDIOVERSION N/A 09/12/2021   Procedure: TRANSESOPHAGEAL ECHOCARDIOGRAM (TEE);  Surgeon: Alleen Borne, MD;  Location: [facility] OR;  Service: Open Heart Surgery;  Laterality: N/A;   TEE WITHOUT CARDIOVERSION N/A 09/29/2021   Procedure: TRANSESOPHAGEAL ECHOCARDIOGRAM (TEE);  Surgeon: Corliss Skains, MD;  Location: [facility] OR;  Service: Open Heart Surgery;  Laterality: N/A;   TEE WITHOUT CARDIOVERSION N/A 10/03/2021   Procedure: TRANSESOPHAGEAL ECHOCARDIOGRAM (TEE);  Surgeon: Lovett Sox, MD;  Location: [facility] OR;  Service: Thoracic;  Laterality: N/A;   TEE WITHOUT CARDIOVERSION  10/07/2021   Procedure: TRANSESOPHAGEAL ECHOCARDIOGRAM (TEE);  Surgeon: Alleen Borne, MD;  Location: [facility] OR;  Service: Open Heart Surgery;;   TEE WITHOUT CARDIOVERSION N/A 10/12/2021   Procedure: TRANSESOPHAGEAL ECHOCARDIOGRAM (TEE);  Surgeon: Alleen Borne, MD;  Location: [facility] OR;  Service: Thoracic;  Laterality: N/A;   TEE WITHOUT CARDIOVERSION N/A 10/17/2021   Procedure: TRANSESOPHAGEAL ECHOCARDIOGRAM (TEE);  Surgeon: Alleen Borne, MD;  Location: [facility] OR;  Service: Open Heart Surgery;  Laterality: N/A;   TESTICLE  SURGERY  1970   pt states testicle was ascended and had to be "pulled down"   TONSILLECTOMY     as a child    ALLERGIES: No Known Allergies  FAMILY HISTORY: History reviewed. No pertinent family history.  SOCIAL HISTORY: Social History   Tobacco Use   Smoking status: Former    Current packs/day: 0.00    Types: Cigarettes    Quit date: 2017    Years since  quitting: 7.5   Smokeless tobacco: Never  Vaping Use   Vaping status: Former   Start date: 10/14/2017   Quit date: 09/09/2021  Substance Use Topics   Alcohol use: Not Currently    Comment: very occasional, maybe a beer or mixed drink once every few months   Drug use: Not Currently   Social History   Social History Narrative   Right handed   Unemployed   Lives with uncle   No caffeine   Two story home    Objective:  Vital Signs:  BP 101/66   Pulse 82   Resp 20   Ht 5\' 7"  (1.702 m)   Wt 194 lb (88 kg)   SpO2 98%   BMI 30.38 kg/m   General: General appearance: Awake and alert. No distress. Cooperative with exam.  Skin: No obvious rash or jaundice. HEENT: Atraumatic. Anicteric. Lungs: CTAB  Heart: Regular. No carotid bruits Psych: Affect appropriate.  Neurological: Mental Status: Alert. Speech fluent. No pseudobulbar affect Cranial Nerves: CNII: No RAPD. Visual fields intact. CNIII, IV, VI: PERRL. No nystagmus. EOMI. CN V: Facial sensation intact bilaterally to fine touch. CN VII: Facial muscles symmetric and strong. No ptosis at rest. CN VIII: Hears finger rub well bilaterally. CN IX: No hypophonia. CN X: Palate elevates symmetrically. CN XI: Full strength shoulder shrug bilaterally. CN XII: Tongue protrusion full and midline. No atrophy or fasciculations. Mild dysarthria. Motor: Tone is normal.  Individual muscle group testing (MRC grade out of 5):  Movement     Neck flexion 5    Neck extension 5     Right Left   Shoulder abduction 5 5   Elbow flexion 5 5   Elbow extension 5 5   Finger  abduction - FDI 1 5   Finger abduction - ADM 1 5   Finger extension 4+ 5   Finger distal flexion - 2/3 3 5    Finger distal flexion - 4/5 3 5    Thumb flexion - FPL 2 5   Thumb abduction - APB 2 5    Hip flexion 5 5   Hip extension 5 5   Hip adduction 5 5   Hip abduction 5 5   Knee extension 5 5   Knee flexion 5 5   Dorsiflexion 5 5   Plantarflexion 5 5     Reflexes:  Right Left   Bicep 1+ 1+   Tricep 1+ 1+   BrRad 1+ 1+   Knee 1+ 1+   Ankle 0 0    Pathological Reflexes: Babinski: flexor response bilaterally Hoffman: absent bilaterally Troemner: absent bilaterally Sensation intact to light touch in all extremities Coordination: Intact finger-to- nose-finger bilaterally Gait: Able to rise from chair with arms crossed unassisted. Wide based, mildly unsteady gait.    Labs and Imaging review: Internal labs: Lab Results  Component Value Date   HGBA1C 6.0 (A) 06/06/2022   Lab Results  Component Value Date   VITAMINB12 2,063 (H) 10/23/2021   Lab Results  Component Value Date   TSH 0.460 03/12/2021   Lab Results  Component Value Date   ESRSEDRATE 50 (H) 09/29/2021   Lipid panel (03/12/21):  Component     Latest Ref Rng 03/12/2021  Cholesterol     0 - 200 mg/dL 034 (H)   Triglycerides     <150 mg/dL 742 (H)   HDL Cholesterol     >40 mg/dL 31 (L)   Total CHOL/HDL Ratio     RATIO 8.1  VLDL     0 - 40 mg/dL UNABLE TO CALCULATE IF TRIGLYCERIDE OVER 400 mg/dL   LDL (calc)     0 - 99 mg/dL UNABLE TO CALCULATE IF TRIGLYCERIDE OVER 400 mg/dL      Imaging: MRI brain wo contrast (12/28/2020): I personally reviewed and agree with the below findings of acute infarcts in multiple vascular territories including bilateral cerebellum, bilateral parietal and frontal lobes. Radiology read: FINDINGS: Brain: There is a large acute or early subacute infarct of the superior right cerebellum. Additionally, there are multiple scattered punctate acute infarcts scattered  throughout both hemispheres in the anterior and posterior circulation. There is petechial hemorrhage in the area of the right cerebellar infarct with mild surrounding edema . there is an old left cerebellar infarct. No chronic microhemorrhage. Normal midline structures.   Vascular: Major flow voids are preserved.   Skull and upper cervical spine: Normal calvarium and skull base. Visualized upper cervical spine and soft tissues are normal.   Sinuses/Orbits:No paranasal sinus fluid levels or advanced mucosal thickening. No mastoid or middle ear effusion. Normal orbits.   IMPRESSION: 1. Large acute infarct of the superior right cerebellum with mild surrounding edema and petechial hemorrhage. 2. Multiple scattered punctate acute infarcts in multiple vascular territories, consistent with a central cardiac or aortic embolic source. 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  CTA head and neck (12/28/20): FINDINGS: CT HEAD FINDINGS   Brain: Redemonstrated hypodensity in the right cerebellar hemisphere, consistent with the infarct seen on MRI, with new areas of increased density, likely petechial hemorrhage, also consistent with the MRI. No new areas of hypodensity; no CT correlate is seen for other punctate infarcts seen on the MRI. No mass, mass effect, or midline shift   Vascular: No hyperdense vessel.   Skull: Normal. Negative for fracture or focal lesion.   Sinuses: Mucous retention cysts in the left maxillary sinus and left ethmoid air cells. The mastoids are well aerated.   Orbits: No acute finding.   Review of the MIP images confirms the above findings   CTA NECK FINDINGS   Aortic arch: 3 vessel arch with a common origin of the brachiocephalic and left common carotid arteries, as well as an aortic origin the left vertebral artery. Imaged portion shows no evidence of aneurysm or dissection. Moderate stenosis of the left vertebral artery origin, secondary to calcified and  noncalcified plaque (series 12, image 327).   Right carotid system: No evidence of dissection, stenosis (50% or greater) or occlusion. Calcified and noncalcified plaque at the bifurcation, with less than 50% stenosis.   Left carotid system: No evidence of dissection, stenosis (50% or greater) or occlusion. Calcified and noncalcified plaque at the bifurcation, with less than 50% stenosis.   Vertebral arteries: Moderate stenosis at the origin of the left vertebral artery. The left vertebral artery is otherwise patent through the V3 segment. Mild narrowing at the origin of the right vertebral artery, which is otherwise patent through the V3 segment.   Skeleton: Degenerative changes in the cervical spine. No acute osseous abnormality.   Other neck: Significant enlargement of the right thyroid lobe, with multiple hypoattenuating nodules.   Upper chest: Small bilateral pleural effusions with associated atelectasis. Multiple prominent mediastinal lymph nodes (series 12, image 362).   Review of the MIP images confirms the above findings   CTA HEAD FINDINGS   Anterior circulation: Multifocal calcified and noncalcified narrowing of the right intracranial ICA, which is severe in the cavernous (series 12, image 113), and ophthalmic/supraclinoid segments (  series 12, image 108). Severe narrowing in the distal left cavernous ICA (series 12, images 109-111) and supraclinoid segments (series 12, image 102.   Mild to moderate narrowing of the distal right A1. Normal left A1. Normal anterior communicating artery. There is mild irregularity of the distal ACAs, but they remain patent.   Mild irregularity in the M1 segments, which are patent. Normal bifurcations. Distal MCA segments are mildly irregular but perfused.   Posterior circulation: Severe narrowing in the left V4, with focal nonopacification (series 12, image 145), with distal reconstitution proximal to the vertebrobasilar junction,  possibly retrograde flow. Moderate focal narrowing in the right V4 segment (series 12, images 150-155).   Severe narrowing of the basilar artery (series 12, image 122), without complete occlusion. Likely severe focal narrowing at the origin of the right superior cerebellar artery. The origin of the left superior cerebellar artery appears patent.   The right posterior communicating artery is patent, with moderate focal stenosis near the origin (series 12, image 106). Diminutive right P1. The left posterior is patent. Moderate stenosis at the origin of the left P1. Diminutive and somewhat irregular P2 and P3 segments bilaterally.   Venous sinuses: As permitted by contrast timing, patent.   Anatomic variants: None significant   Review of the MIP images confirms the above findings   IMPRESSION: 1. Redemonstrated hypodensity in the right cerebellar hemisphere, consistent with the infarct seen on MRI, with petechial hemorrhage. No new infarct is seen. 2. 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 3. Focal narrowing at the origin of the right superior cerebellar artery. 4. Severe narrowing in the left V4 segment, with focal occlusion and distal reconstitution, likely retrograde. Moderate focal narrowing is also seen in the right V4 segment. 5. Multifocal narrowing of the bilateral intracranial internal carotid arteries, which is severe in the cavernous, ophthalmic, and supraclinoid segments, as described above. 6. Moderate stenosis at the origin of the left vertebral artery, which originates from the aorta. Mild narrowing at the origin of the right vertebral artery. No other hemodynamically significant stenosis in the neck. 7. Small bilateral pleural effusions and prominent mediastinal lymph nodes. Consider CT of the chest for further evaluation. 8. Enlarged right thyroid lobe with multiple hypoattenuating nodules. This has not previously been evaluated, an  ultrasound of the thyroid is recommended.  Echo (05/16/22):  1. Left ventricular ejection fraction, by estimation, is 60 to 65%. The  left ventricle has normal function. The left ventricle has no regional  wall motion abnormalities. There is mild concentric left ventricular  hypertrophy. Left ventricular diastolic  parameters were normal.   2. Right ventricular systolic function is normal. The right ventricular  size is normal. Tricuspid regurgitation signal is inadequate for assessing  PA pressure.   3. The mitral valve is degenerative. No evidence of mitral valve  regurgitation. No evidence of mitral stenosis.   4. The aortic valve has an indeterminant number of cusps. There is  moderate calcification of the aortic valve. There is moderate thickening  of the aortic valve. Aortic valve regurgitation is trivial. Aortic valve  sclerosis/calcification is present,  without any evidence of aortic stenosis.   5. The inferior vena cava was not well visualized.  LA is normal in size.  Assessment/Plan:  [name] is a 62 y.o. male who presents for history of stroke. He has a relevant medical history of stroke, atrial flutter, HTN, HLD, CAD s/p MI, DM, ESRD on dialysis, former smoker. His neurological examination is pertinent  for dysarthria and right hand weakness. Available diagnostic data is significant for MRI brain in 12/2020 showing acute infarcts in multiple vascular territories, including bilateral cerebellum, bilateral parietal, and bilateral frontal lobes. CTA head and neck showed narrowing in multiple vascular territories as well. The etiology of patient's strokes is likely cardioembolic given the multifocal nature of the acute infarcts. He has atrial flutter, but has been off of eliquis for about 1 month to due cost of medication. I explained the danger of this to patient today. He will be reaching out to cardiology about alternatives and taking asa 81 mg daily until he is able to start  anticoagulation as he should be on long term for atrial flutter.  While his right hand weakness has previously been attributed to his stroke, I did not see clear infarct on MRI brain to explain this weakness. It may be stroke related, but I will get an EMG to ensure there is not a peripheral etiology such as cervical radiculopathy (C8-T1) vs median and ulnar neuropathy.  PLAN: -Patient is reaching out to cardiology about Eliquis. Given that he is on nothing and cannot afford eliquis currently, I recommend asa 81 mg daily until he can get eliquis. He should not take them together and knows to stop asa if he restarts anticoagulation. -Continue atorvastatin 80 mg daily -Referral to speech therapy and occupational therapy -EMG of RUE - to ensure no peripheral etiology for hand weakness  -Return to clinic in 6 months  The impression above as well as the plan as outlined below were extensively discussed with the patient who voiced understanding. All questions were answered to their satisfaction.  When available, results of the above investigations and possible further recommendations will be communicated to the patient via telephone/MyChart. Patient to call office if not contacted after expected testing turnaround time.   Total time spent reviewing records, interview, history/exam, documentation, and coordination of care on day of encounter:  55 min   Thank you for allowing me to participate in patient's care.  If I can answer any additional questions, I would be pleased to do so.  Jacquelyne Balint, MD   CC: [name], MD [address] [place] [address]  CC: Referring provider: [name], MD [address] [place],  [place] [address]

## 2022-08-20 ENCOUNTER — Other Ambulatory Visit (HOSPITAL_COMMUNITY): Payer: Self-pay

## 2022-08-27 ENCOUNTER — Other Ambulatory Visit: Payer: Self-pay

## 2022-08-27 ENCOUNTER — Ambulatory Visit (INDEPENDENT_AMBULATORY_CARE_PROVIDER_SITE_OTHER): Payer: Medicare Other | Admitting: Neurology

## 2022-08-27 ENCOUNTER — Encounter: Payer: Self-pay | Admitting: Neurology

## 2022-08-27 VITALS — BP 101/66 | HR 82 | Resp 20 | Ht 67.0 in | Wt 194.0 lb

## 2022-08-27 DIAGNOSIS — I639 Cerebral infarction, unspecified: Secondary | ICD-10-CM

## 2022-08-27 DIAGNOSIS — R471 Dysarthria and anarthria: Secondary | ICD-10-CM | POA: Diagnosis not present

## 2022-08-27 DIAGNOSIS — M62541 Muscle wasting and atrophy, not elsewhere classified, right hand: Secondary | ICD-10-CM | POA: Diagnosis not present

## 2022-08-27 DIAGNOSIS — I4892 Unspecified atrial flutter: Secondary | ICD-10-CM

## 2022-08-27 DIAGNOSIS — R29898 Other symptoms and signs involving the musculoskeletal system: Secondary | ICD-10-CM

## 2022-08-27 NOTE — Patient Instructions (Signed)
I saw you for follow up of stroke.  Your eliquis is very important. You should reach out to cardiology and see if there is another option you have to get the medication.  Until you have eliquis, I want you to take a baby aspirin every day (81 mg). Stop it when you restart another blood thinner.  Continue atorvastatin 80 mg daily.  I am referring you to speech therapy and occupational therapy.  I want to get a muscle and nerve test on your right arm called an EMG to make sure it is stroke causing the hand weakness and not a pinched nerve.  I will see you back in clinic in 6 months.  The physicians and staff at [facility] are committed to providing excellent care. You may receive a survey requesting feedback about your experience at our office. We strive to receive "very good" responses to the survey questions. If you feel that your experience would prevent you from giving the office a "very good " response, please contact our office to try to remedy the situation. We may be reached at [phone]. Thank you for taking the time out of your busy day to complete the survey.  Jacquelyne Balint, MD [facility]  ELECTROMYOGRAM AND NERVE CONDUCTION STUDIES (EMG/NCS) INSTRUCTIONS  How to Prepare The neurologist conducting the EMG will need to know if you have certain medical conditions. Tell the neurologist and other EMG lab personnel if you: Have a pacemaker or any other electrical medical device Take blood-thinning medications Have hemophilia, a blood-clotting disorder that causes prolonged bleeding Bathing Take a shower or bath shortly before your exam in order to remove oils from your skin. Don't apply lotions or creams before the exam.  What to Expect You'll likely be asked to change into a hospital gown for the procedure and lie down on an examination table. The following explanations can help you understand what will happen during the exam.  Electrodes. The neurologist or a  technician places surface electrodes at various locations on your skin depending on where you're experiencing symptoms. Or the neurologist may insert needle electrodes at different sites depending on your symptoms.  Sensations. The electrodes will at times transmit a tiny electrical current that you may feel as a twinge or spasm. The needle electrode may cause discomfort or pain that usually ends shortly after the needle is removed. If you are concerned about discomfort or pain, you may want to talk to the neurologist about taking a short break during the exam.  Instructions. During the needle EMG, the neurologist will assess whether there is any spontaneous electrical activity when the muscle is at rest - activity that isn't present in healthy muscle tissue - and the degree of activity when you slightly contract the muscle.  He or she will give you instructions on resting and contracting a muscle at appropriate times. Depending on what muscles and nerves the neurologist is examining, he or she may ask you to change positions during the exam.  After your EMG You may experience some temporary, minor bruising where the needle electrode was inserted into your muscle. This bruising should fade within several days. If it persists, contact your primary care doctor.

## 2022-08-28 ENCOUNTER — Encounter (HOSPITAL_COMMUNITY): Payer: Self-pay | Admitting: Surgery

## 2022-08-28 ENCOUNTER — Encounter (HOSPITAL_COMMUNITY): Payer: Self-pay | Admitting: Physician Assistant

## 2022-08-28 ENCOUNTER — Other Ambulatory Visit: Payer: Self-pay

## 2022-08-28 NOTE — Anesthesia Preprocedure Evaluation (Deleted)
Anesthesia Evaluation    Airway        Dental   Pulmonary former smoker          Cardiovascular hypertension,      Neuro/Psych    GI/Hepatic   Endo/Other  diabetes    Renal/GU      Musculoskeletal   Abdominal   Peds  Hematology   Anesthesia Other Findings   Reproductive/Obstetrics                              Anesthesia Physical Anesthesia Plan  ASA:   Anesthesia Plan:    Post-op Pain Management:    Induction:   PONV Risk Score and Plan:   Airway Management Planned:   Additional Equipment:   Intra-op Plan:   Post-operative Plan:   Informed Consent:   Plan Discussed with:   Anesthesia Plan Comments: (PAT note by Antionette Poles, PA-C: 62 year old male with pertinent history including IDDM 2 (A1c 6.0 on 06/06/2022), HTN, CAD (NSTEMI 03/2015, s/p staged PCI 03/20/15 & 03/22/15; NSTEMI 12/24/20; CABG 09/12/21: LIMA-LAD, SVG-DIAG, SVG-OM1-OM2, SVG-dRCA 09/12/21; readmission for pericardial effusion, cardiogenic shock, s/p right VATS for drainage of pericardial and right pleural effusions 09/28/21, ECMO->Impella LVAD 10/07/21-10/17/21; tracheostomy 10/17/21 - 11/05/21), HLD, PAF (post-CABG 09/2021), CKD (AKI with progression to ESRD in setting of cardiogenic shock, CRRT 10/07/21-->HD), exertional dyspnea, 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  He converted to sinus rhythm spontaneously.  Cardiology was consulted during admission.  Per note by Dr. Carolan Clines 05/15/2022, "He presents here after IHD with EAT v flutter. He is now in sinus rhythm. Recommending oral amiodarone load 400 mg BID for 10 days then 200 mg daily thereafter. Can start eliquis 5 mg BID with afib hx. Agree with FU echo considering his hx. If no significant changes on his echo, he can be  discharged."  Echo showed EF 60 to 65% with no regional wall motion abnormalities and normal RV function with no significant valvular abnormalities.  Patient reported he is not taking Eliquis due to cost.  He was advised to follow-up with cardiology for additional options.  Last seen by cardiothoracic surgeon Dr. Laneta Simmers on 07/16/2022 and noted to be doing well at that time, walking independently without a walker.  Denied chest pain or shortness of breath.  Follows with neurology for history of CVA.  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  Underwent right arm first stage BVT 03/14/2022 without complication.  Will need DOS labs and eval.   EKG 05/16/2022: Sinus rhythm.  Rate 83.  Atrial premature complex.  LAE, consider biatrial enlargement.  Borderline T wave abnormalities.  No significant change.  TTE 05/16/2022:  1. Left ventricular ejection fraction, by estimation, is 60 to 65%. The  left ventricle has normal function. The left ventricle has no regional  wall motion abnormalities. There is mild concentric left ventricular  hypertrophy. Left ventricular diastolic  parameters were normal.   2. Right ventricular systolic function is normal. The right ventricular  size is normal. Tricuspid regurgitation signal is inadequate for assessing  PA pressure.   3. The mitral valve is degenerative. No evidence of mitral valve  regurgitation. No evidence of mitral stenosis.   4. The aortic valve has an indeterminant number of cusps. There is  moderate calcification of the aortic valve.  There is moderate thickening  of the aortic valve. Aortic valve regurgitation is trivial. Aortic valve  sclerosis/calcification is present,  without any evidence of aortic stenosis.   5. The inferior vena cava was not well visualized.    )         Anesthesia Quick Evaluation

## 2022-08-28 NOTE — Progress Notes (Addendum)
[name] had not answered his phone, I called [facility], a sectary to a message asking patient to call me while he is there.  [name] said he was going to call me after dialysis, but will speak with me now. [name] denies chest pain or shortness of breath. Patient denies having any s/s of Covid in his household, also denies any known exposure to Covid.   [name] was supposed to be on Eliquis, he is unable to afford it, Dr. [name], neurologist instructed patient  to call cardiologist,(Dr. Laurance Flatten.. [name] reports that he was told to call the office back and they will fill out forms to see if the company could discount the medication for him.  [name] does not have medication list with him, he said he will call me when he gets home, around  4 pm.  [name] called , he read what medications he is taking, he did not name a some medications,  patient did not say why , just that he only takes the medications he named.

## 2022-08-28 NOTE — Progress Notes (Signed)
Anesthesia Chart Review: Same day workup  62 year old male with pertinent history including IDDM 2 (A1c 6.0 on 06/06/2022), HTN, CAD (NSTEMI 03/2015, s/p staged PCI 03/20/15 & 03/22/15; NSTEMI 12/24/20; CABG 09/12/21: LIMA-LAD, SVG-DIAG, SVG-OM1-OM2, SVG-dRCA 09/12/21; readmission for pericardial effusion, cardiogenic shock, s/p right VATS for drainage of pericardial and right pleural effusions 09/28/21, ECMO->Impella LVAD 10/07/21-10/17/21; tracheostomy 10/17/21 - 11/05/21), HLD, PAF (post-CABG 09/2021), CKD (AKI with progression to ESRD in setting of cardiogenic shock, CRRT 10/07/21-->HD), exertional dyspnea, 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  He converted to sinus rhythm spontaneously.  Cardiology was consulted during admission.  Per note by Dr. Carolan Clines 05/15/2022, "He presents here after IHD with EAT v flutter. He is now in sinus rhythm. Recommending oral amiodarone load 400 mg BID for 10 days then 200 mg daily thereafter. Can start eliquis 5 mg BID with afib hx. Agree with FU echo considering his hx. If no significant changes on his echo, he can be discharged."  Echo showed EF 60 to 65% with no regional wall motion abnormalities and normal RV function with no significant valvular abnormalities.  Patient reported he is not taking Eliquis due to cost.  He was advised to follow-up with cardiology for additional options.  Last seen by cardiothoracic surgeon Dr. Laneta Simmers on 07/16/2022 and noted to be doing well at that time, walking independently without a walker.  Denied chest pain or shortness of breath.  Follows with neurology for history of CVA.  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  Underwent right arm first stage BVT 03/14/2022 without complication.  Will need DOS labs and eval.   EKG 05/16/2022: Sinus rhythm.  Rate 83.  Atrial premature  complex.  LAE, consider biatrial enlargement.  Borderline T wave abnormalities.  No significant change.  TTE 05/16/2022:  1. Left ventricular ejection fraction, by estimation, is 60 to 65%. The  left ventricle has normal function. The left ventricle has no regional  wall motion abnormalities. There is mild concentric left ventricular  hypertrophy. Left ventricular diastolic  parameters were normal.   2. Right ventricular systolic function is normal. The right ventricular  size is normal. Tricuspid regurgitation signal is inadequate for assessing  PA pressure.   3. The mitral valve is degenerative. No evidence of mitral valve  regurgitation. No evidence of mitral stenosis.   4. The aortic valve has an indeterminant number of cusps. There is  moderate calcification of the aortic valve. There is moderate thickening  of the aortic valve. Aortic valve regurgitation is trivial. Aortic valve  sclerosis/calcification is present,  without any evidence of aortic stenosis.   5. The inferior vena cava was not well visualized.    Zannie Cove [facility] Short Stay Center/Anesthesiology Phone [phone] 08/28/2022 3:27 PM

## 2022-08-29 ENCOUNTER — Telehealth: Payer: Self-pay | Admitting: Cardiovascular Disease

## 2022-08-29 NOTE — Progress Notes (Signed)
Patient called and made aware of surgery cancellation due to system outage.

## 2022-08-29 NOTE — Telephone Encounter (Signed)
Patient dropped off patient assistance form.

## 2022-08-29 NOTE — Telephone Encounter (Signed)
Pt's patient assistance application was scanned to Lynn, LPN email. FYI 

## 2022-09-01 ENCOUNTER — Telehealth: Payer: Self-pay

## 2022-09-01 NOTE — Telephone Encounter (Signed)
Left VM for patient to return call to reschedule surgery that was cancelled due to system outage on 7/19.

## 2022-09-01 NOTE — Telephone Encounter (Signed)
**Note De-Identified  Obfuscation** I have completed the providers page of the pts BMSPAF application for Eliquis assistance and I have e-mailed all to the nurse working with Dr Shari Prows this morning so she can obtain her signature, date it, and to then fax all to [facility] at the fax number written on the cover letter included.

## 2022-09-02 NOTE — Telephone Encounter (Signed)
Attempted to reach patient to reschedule surgery. Left VM to return call.    Spoke with Misty Stanley at [facility]. Scheduled patient for next provider availability on 8/16. Will fax instructions to facility. Advised to have patient contact office to verify or if date does not work. She voiced understanding.

## 2022-09-05 ENCOUNTER — Ambulatory Visit: Payer: Self-pay | Admitting: Family Medicine

## 2022-09-11 ENCOUNTER — Other Ambulatory Visit: Payer: Self-pay

## 2022-09-12 ENCOUNTER — Other Ambulatory Visit: Payer: Self-pay

## 2022-09-15 ENCOUNTER — Ambulatory Visit (INDEPENDENT_AMBULATORY_CARE_PROVIDER_SITE_OTHER): Payer: Medicare Other | Admitting: Neurology

## 2022-09-15 DIAGNOSIS — M62541 Muscle wasting and atrophy, not elsewhere classified, right hand: Secondary | ICD-10-CM

## 2022-09-15 DIAGNOSIS — I639 Cerebral infarction, unspecified: Secondary | ICD-10-CM

## 2022-09-15 DIAGNOSIS — R29898 Other symptoms and signs involving the musculoskeletal system: Secondary | ICD-10-CM

## 2022-09-15 DIAGNOSIS — G5621 Lesion of ulnar nerve, right upper limb: Secondary | ICD-10-CM

## 2022-09-15 DIAGNOSIS — G5611 Other lesions of median nerve, right upper limb: Secondary | ICD-10-CM

## 2022-09-15 DIAGNOSIS — R471 Dysarthria and anarthria: Secondary | ICD-10-CM

## 2022-09-15 DIAGNOSIS — I4892 Unspecified atrial flutter: Secondary | ICD-10-CM

## 2022-09-15 NOTE — Procedures (Signed)
[facility]  [address] [place], [address]  [place], [place] [address] Tel: [phone] Fax: [phone] Test Date:  09/15/2022  Patient: [name] DOB: [birth year] Physician: Jacquelyne Balint, MD  Sex: Male Height: 5\' 7"  Ref Phys: Jacquelyne Balint, MD  ID#: [id number]   Technician:    History: This is a 62 year old male with right hand weakness.  NCV & EMG Findings: Extensive electrodiagnostic evaluation of the right upper limb with additional nerve conduction studies of the left upper limb shows: Right ulnar sensory response is absent. Right median sensory response is within normal limits, but 50% of response of the left median sensory response. Right radial sensory response is within normal limits. Right ulnar (ADM) motor response shows reduced amplitude (0.62 mV) with additional conduction velocity slowing from above elbow to below elbow stimulation sites (34 m/s). Right median (APB) motor response shows reduced amplitude (1.73 mV) with evidence of Thressa Sheller Anastomosis (median to ulnar crossover in the forearm). Chronic motor axon loss changes WITH active denervation changes are seen in the right first dorsal interosseous, adductor digiti minimi, and flexor pollicis longus muscles. Chronic motor axon loss changes WITHOUT active denervation changes are seen in the right abductor pollicis brevis and flexor digitorum profundus to digits 4,5 muscles.  Impression: This is an abnormal study. 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 Findings are severe in degree electrically. Evidence of a right median mononeuropathy proximal to the flexor pollicis longus and distal to the pronator teres, perhaps consistent with pronator teres syndrome. Findings are moderate to severe in degree  electrically. No electrodiagnostic evidence of a right cervical (C5-T1) motor radiculopathy.    ___________________________ Jacquelyne Balint, MD    Nerve Conduction Studies Motor Nerve Results    Latency Amplitude F-Lat Segment Distance CV Comment  Site (ms) Norm (mV) Norm (ms)  (cm) (m/s) Norm   Right Median (APB) Motor       Median (APB)  Wrist 3.9  < 4.0 *0.63  > 5.0        Elbow 11.7 - 1.10 -  Elbow-Wrist - -  > 50        Ulnar (APB)  Wrist+1 3.5 - 1.10 -        Right Ulnar (ADM) Motor  Wrist 3.0  < 3.1 *0.62  > 7.0        Bel elbow 7.6 - 0.60 -  Bel elbow-Wrist 23 50  > 50   Ab elbow 10.5 - 0.50 -  Ab elbow-Bel elbow 10 34 -    Sensory Sites    Neg Peak Lat Amplitude (O-P) Segment Distance Velocity Comment  Site (ms) Norm (V) Norm  (cm) (ms)   Left Median Sensory  Wrist 3.5  < 3.8 22  > 10 Wrist-Dig II 13    Right Median Sensory  Wrist-Dig II 3.3  < 3.8 11  > 10 Wrist-Dig II 13    Right Radial Sensory  Forearm-Wrist 2.2  < 2.8 32  > 10 Forearm-Wrist 10    Right Ulnar Sensory  Wrist-Dig V *NR  < 3.2 *NR  > 5 Wrist-Dig V 11     Electromyography   Side Muscle Ins.Act Fibs Fasc Recrt Amp Dur Poly Activation Comment  Right FDI Nml *1+ Nml *3- *1+ *1+ *2+ Nml Atrophy  Right ADM Nml *1+ Nml *3- *1+ *1+ *2+ Nml Atrophy  Right EIP Nml Nml Nml Nml Nml Nml Nml Nml N/A  Right FPL Nml *1+ Nml *2- *1+ *1+ *1+ Nml Atrophy  Right APB Nml Nml Nml *3- *1+ *1+ *2+ Nml Atrophy  Right Pronator teres Nml Nml Nml Nml Nml Nml Nml Nml N/A  Right FDP *1+ Nml Nml *1- *1+ *1+ *1+ Nml N/A  Right Biceps Nml Nml Nml Nml Nml Nml Nml Nml N/A  Right Triceps Nml Nml Nml Nml Nml Nml Nml Nml N/A  Right Deltoid Nml Nml Nml Nml Nml Nml Nml Nml N/A  Right C7 PSP Nml Nml Nml Nml Nml Nml Nml Nml N/A      Waveforms:  Motor      Sensory

## 2022-09-15 NOTE — Telephone Encounter (Signed)
**Note De-Identified  Obfuscation** Letter received from [facility]  fax stating that they have approved the pt for Eliquis assistance until 02/10/2023. [id number]  The letter states that they have notified the pt of this approval as well.

## 2022-09-16 ENCOUNTER — Encounter: Payer: Self-pay | Admitting: Neurology

## 2022-09-17 ENCOUNTER — Other Ambulatory Visit: Payer: Self-pay

## 2022-09-17 DIAGNOSIS — M62541 Muscle wasting and atrophy, not elsewhere classified, right hand: Secondary | ICD-10-CM

## 2022-09-17 DIAGNOSIS — G5621 Lesion of ulnar nerve, right upper limb: Secondary | ICD-10-CM

## 2022-09-17 DIAGNOSIS — R29898 Other symptoms and signs involving the musculoskeletal system: Secondary | ICD-10-CM

## 2022-09-18 ENCOUNTER — Other Ambulatory Visit: Payer: Self-pay

## 2022-09-18 ENCOUNTER — Telehealth: Payer: Self-pay | Admitting: Neurology

## 2022-09-18 NOTE — Telephone Encounter (Signed)
LMOM     can you schedule this pt for a Ultra Sound RUE .. It does not need a PA. thank you

## 2022-09-22 ENCOUNTER — Ambulatory Visit: Payer: Medicare Other | Attending: Speech Pathology | Admitting: Speech Pathology

## 2022-09-23 ENCOUNTER — Ambulatory Visit: Payer: Medicare Other | Admitting: Physical Therapy

## 2022-09-23 ENCOUNTER — Encounter: Payer: Medicare Other | Admitting: Occupational Therapy

## 2022-09-25 ENCOUNTER — Other Ambulatory Visit: Payer: Self-pay

## 2022-09-25 ENCOUNTER — Encounter (HOSPITAL_COMMUNITY): Payer: Self-pay | Admitting: Surgery

## 2022-09-25 NOTE — Progress Notes (Signed)
SDW call  Patient was given pre-op instructions over the phone. Patient verbalized understanding of instructions provided.     PCP - Dr. [name] Cardiologist - Dr. Laurance Flatten Pulmonary:    PPM/ICD - denies Device Orders - n/a Rep Notified - n/a   Chest x-ray - 05/15/2022 EKG -  05/16/2022 Stress Test - ECHO - 05/16/2022 Cardiac Cath - 12/26/2020  Sleep Study/sleep apnea/CPAP: denies  Type II diabetic.  States he never checks his blood sugar and he has stopped taking his insulin Fasting Blood sugar range: does not check How often check sugars: does not check   Blood Thinner Instructions: Eliquis, states last dose 09/24/2022 Aspirin Instructions:denies   ERAS Protcol - No, NPO  COVID TEST- n/a    Anesthesia review: Yes. HTN, DM, CAD, MI, stroke, angina SOB   Patient denies shortness of breath, fever, cough and chest pain over the phone call  Your procedure is scheduled on Friday September 26, 2022  Report to [facility] Main Entrance "A" at  0800  A.M., then check in with the Admitting office.  Call this number if you have problems the morning of surgery:  [phone]   If you have any questions prior to your surgery date call [phone]: Open Monday-Friday 8am-4pm If you experience any cold or flu symptoms such as cough, fever, chills, shortness of breath, etc. between now and your scheduled surgery, please notify us at the above number    Remember:  Do not eat or drink  after midnight the night before your surgery  Take these medicines the morning of surgery with A SIP OF WATER:  Amiodarone, atorvasttin, metoprolol  As of today, STOP taking any Aspirin (unless otherwise instructed by your surgeon) Aleve, Naproxen, Ibuprofen, Motrin, Advil, Goody's, BC's, all herbal medications, fish oil, and all vitamins.

## 2022-09-26 ENCOUNTER — Other Ambulatory Visit: Payer: Self-pay

## 2022-09-26 ENCOUNTER — Ambulatory Visit (HOSPITAL_COMMUNITY)
Admission: RE | Admit: 2022-09-26 | Discharge: 2022-09-26 | Disposition: A | Discharge status: Home / Self Care | Payer: Medicare Other | Source: Ambulatory Visit | Attending: Surgery | Admitting: Surgery

## 2022-09-26 ENCOUNTER — Encounter (HOSPITAL_COMMUNITY): Payer: Medicare Other | Admitting: Vascular Surgery

## 2022-09-26 ENCOUNTER — Ambulatory Visit (HOSPITAL_COMMUNITY): Payer: Medicare Other

## 2022-09-26 ENCOUNTER — Encounter (HOSPITAL_COMMUNITY)
Admission: RE | Disposition: A | Discharge status: Home / Self Care | Payer: Self-pay | Source: Ambulatory Visit | Attending: Surgery

## 2022-09-26 ENCOUNTER — Encounter (HOSPITAL_COMMUNITY): Payer: Self-pay | Admitting: Surgery

## 2022-09-26 ENCOUNTER — Encounter (HOSPITAL_COMMUNITY): Payer: Self-pay | Admitting: Vascular Surgery

## 2022-09-26 DIAGNOSIS — E1122 Type 2 diabetes mellitus with diabetic chronic kidney disease: Secondary | ICD-10-CM | POA: Insufficient documentation

## 2022-09-26 DIAGNOSIS — N186 End stage renal disease: Secondary | ICD-10-CM | POA: Diagnosis present

## 2022-09-26 DIAGNOSIS — N185 Chronic kidney disease, stage 5: Secondary | ICD-10-CM | POA: Diagnosis not present

## 2022-09-26 DIAGNOSIS — I252 Old myocardial infarction: Secondary | ICD-10-CM | POA: Insufficient documentation

## 2022-09-26 DIAGNOSIS — Z992 Dependence on renal dialysis: Secondary | ICD-10-CM

## 2022-09-26 DIAGNOSIS — Z87891 Personal history of nicotine dependence: Secondary | ICD-10-CM | POA: Insufficient documentation

## 2022-09-26 DIAGNOSIS — K219 Gastro-esophageal reflux disease without esophagitis: Secondary | ICD-10-CM | POA: Diagnosis not present

## 2022-09-26 DIAGNOSIS — Z6829 Body mass index (BMI) 29.0-29.9, adult: Secondary | ICD-10-CM | POA: Insufficient documentation

## 2022-09-26 DIAGNOSIS — Z794 Long term (current) use of insulin: Secondary | ICD-10-CM | POA: Insufficient documentation

## 2022-09-26 DIAGNOSIS — Z955 Presence of coronary angioplasty implant and graft: Secondary | ICD-10-CM | POA: Insufficient documentation

## 2022-09-26 DIAGNOSIS — E669 Obesity, unspecified: Secondary | ICD-10-CM | POA: Insufficient documentation

## 2022-09-26 DIAGNOSIS — Z7901 Long term (current) use of anticoagulants: Secondary | ICD-10-CM | POA: Insufficient documentation

## 2022-09-26 DIAGNOSIS — Z951 Presence of aortocoronary bypass graft: Secondary | ICD-10-CM | POA: Insufficient documentation

## 2022-09-26 DIAGNOSIS — I120 Hypertensive chronic kidney disease with stage 5 chronic kidney disease or end stage renal disease: Secondary | ICD-10-CM | POA: Insufficient documentation

## 2022-09-26 DIAGNOSIS — I2510 Atherosclerotic heart disease of native coronary artery without angina pectoris: Secondary | ICD-10-CM | POA: Insufficient documentation

## 2022-09-26 DIAGNOSIS — E7800 Pure hypercholesterolemia, unspecified: Secondary | ICD-10-CM | POA: Diagnosis not present

## 2022-09-26 HISTORY — PX: BASCILIC VEIN TRANSPOSITION: SHX5742

## 2022-09-26 LAB — POCT I-STAT, CHEM 8
BUN: 36 mg/dL — ABNORMAL HIGH (ref 8–23)
Calcium, Ion: 1.05 mmol/L — ABNORMAL LOW (ref 1.15–1.40)
Chloride: 100 mmol/L (ref 98–111)
Creatinine, Ser: 9 mg/dL — ABNORMAL HIGH (ref 0.61–1.24)
Glucose, Bld: 116 mg/dL — ABNORMAL HIGH (ref 70–99)
HCT: 37 % — ABNORMAL LOW (ref 39.0–52.0)
Hemoglobin: 12.6 g/dL — ABNORMAL LOW (ref 13.0–17.0)
Potassium: 4.2 mmol/L (ref 3.5–5.1)
Sodium: 136 mmol/L (ref 135–145)
TCO2: 25 mmol/L (ref 22–32)

## 2022-09-26 LAB — GLUCOSE, CAPILLARY
Glucose-Capillary: 107 mg/dL — ABNORMAL HIGH (ref 70–99)
Glucose-Capillary: 100 mg/dL — ABNORMAL HIGH (ref 70–99)
Glucose-Capillary: 99 mg/dL (ref 70–99)

## 2022-09-26 SURGERY — TRANSPOSITION, VEIN, BASILIC
Anesthesia: General | Site: Arm Upper | Laterality: Right

## 2022-09-26 MED ORDER — DEXAMETHASONE SODIUM PHOSPHATE 10 MG/ML IJ SOLN
INTRAMUSCULAR | Status: DC | PRN
  Administered 2022-09-26: 10 mg via INTRAVENOUS

## 2022-09-26 MED ORDER — PHENYLEPHRINE 80 MCG/ML (10ML) SYRINGE FOR IV PUSH (FOR BLOOD PRESSURE SUPPORT)
PREFILLED_SYRINGE | INTRAVENOUS | Status: DC | PRN
  Administered 2022-09-26 (×5): 160 ug via INTRAVENOUS

## 2022-09-26 MED ORDER — SODIUM CHLORIDE 0.9 % IV SOLN: INTRAVENOUS | Status: DC

## 2022-09-26 MED ORDER — FENTANYL CITRATE (PF) 100 MCG/2ML IJ SOLN: 25.0000 ug | INTRAMUSCULAR | Status: DC | PRN

## 2022-09-26 MED ORDER — ONDANSETRON HCL 4 MG/2ML IJ SOLN
INTRAMUSCULAR | Status: AC
  Filled 2022-09-26: qty 2

## 2022-09-26 MED ORDER — FENTANYL CITRATE (PF) 250 MCG/5ML IJ SOLN
INTRAMUSCULAR | Status: AC
  Filled 2022-09-26: qty 5

## 2022-09-26 MED ORDER — ONDANSETRON HCL 4 MG/2ML IJ SOLN
INTRAMUSCULAR | Status: DC | PRN
Start: 2022-09-26 — End: 2022-09-26
  Administered 2022-09-26: 4 mg via INTRAVENOUS

## 2022-09-26 MED ORDER — LIDOCAINE 2% (20 MG/ML) 5 ML SYRINGE
INTRAMUSCULAR | Status: AC
  Filled 2022-09-26: qty 5

## 2022-09-26 MED ORDER — MIDAZOLAM HCL 2 MG/2ML IJ SOLN
INTRAMUSCULAR | Status: DC | PRN
  Administered 2022-09-26 (×2): 1 mg via INTRAVENOUS

## 2022-09-26 MED ORDER — DEXAMETHASONE SODIUM PHOSPHATE 10 MG/ML IJ SOLN
INTRAMUSCULAR | Status: AC
  Filled 2022-09-26: qty 1

## 2022-09-26 MED ORDER — LIDOCAINE 2% (20 MG/ML) 5 ML SYRINGE
INTRAMUSCULAR | Status: DC | PRN
  Administered 2022-09-26: 60 mg via INTRAVENOUS

## 2022-09-26 MED ORDER — HEPARIN 6000 UNIT IRRIGATION SOLUTION
Status: AC
  Filled 2022-09-26: qty 500

## 2022-09-26 MED ORDER — PROPOFOL 10 MG/ML IV BOLUS
INTRAVENOUS | Status: DC | PRN
  Administered 2022-09-26: 150 mg via INTRAVENOUS

## 2022-09-26 MED ORDER — CHLORHEXIDINE GLUCONATE 4 % EX SOLN: 60.0000 mL | Freq: Once | CUTANEOUS | Status: DC

## 2022-09-26 MED ORDER — BUPIVACAINE LIPOSOME 1.3 % IJ SUSP
INTRAMUSCULAR | Status: AC
  Filled 2022-09-26: qty 20

## 2022-09-26 MED ORDER — CHLORHEXIDINE GLUCONATE 0.12 % MT SOLN: 15.0000 mL | Freq: Once | OROMUCOSAL | Status: AC

## 2022-09-26 MED ORDER — PROPOFOL 10 MG/ML IV BOLUS
INTRAVENOUS | Status: AC
  Filled 2022-09-26: qty 20

## 2022-09-26 MED ORDER — FENTANYL CITRATE (PF) 250 MCG/5ML IJ SOLN
INTRAMUSCULAR | Status: DC | PRN
  Administered 2022-09-26 (×5): 25 ug via INTRAVENOUS

## 2022-09-26 MED ORDER — PROMETHAZINE HCL 25 MG/ML IJ SOLN: 6.2500 mg | INTRAMUSCULAR | Status: DC | PRN

## 2022-09-26 MED ORDER — CHLORHEXIDINE GLUCONATE 0.12 % MT SOLN
OROMUCOSAL | Status: AC
  Administered 2022-09-26: 15 mL via OROMUCOSAL
  Filled 2022-09-26: qty 15

## 2022-09-26 MED ORDER — ROCURONIUM BROMIDE 10 MG/ML (PF) SYRINGE
PREFILLED_SYRINGE | INTRAVENOUS | Status: AC
  Filled 2022-09-26: qty 10

## 2022-09-26 MED ORDER — BUPIVACAINE LIPOSOME 1.3 % IJ SUSP
INTRAMUSCULAR | Status: DC | PRN
  Administered 2022-09-26: 100 mL

## 2022-09-26 MED ORDER — VASOPRESSIN 20 UNIT/ML IV SOLN
INTRAVENOUS | Status: AC
  Filled 2022-09-26: qty 1

## 2022-09-26 MED ORDER — BUPIVACAINE HCL (PF) 0.5 % IJ SOLN
INTRAMUSCULAR | Status: AC
  Filled 2022-09-26: qty 30

## 2022-09-26 MED ORDER — OXYCODONE-ACETAMINOPHEN 5-325 MG PO TABS
1.0000 | ORAL_TABLET | Freq: Four times a day (QID) | ORAL | 0 refills | Status: DC | PRN
  Filled 2022-09-26: qty 20, 5d supply, fill #0

## 2022-09-26 MED ORDER — ORAL CARE MOUTH RINSE: 15.0000 mL | Freq: Once | OROMUCOSAL | Status: AC

## 2022-09-26 MED ORDER — PHENYLEPHRINE HCL-NACL 20-0.9 MG/250ML-% IV SOLN
INTRAVENOUS | Status: DC | PRN
  Administered 2022-09-26: 50 ug/min via INTRAVENOUS

## 2022-09-26 MED ORDER — ALBUMIN HUMAN 5 % IV SOLN: INTRAVENOUS | Status: DC | PRN

## 2022-09-26 MED ORDER — HEMOSTATIC AGENTS (NO CHARGE) OPTIME
TOPICAL | Status: DC | PRN
  Administered 2022-09-26: 1 via TOPICAL

## 2022-09-26 MED ORDER — MIDAZOLAM HCL 2 MG/2ML IJ SOLN
INTRAMUSCULAR | Status: AC
  Filled 2022-09-26: qty 2

## 2022-09-26 MED ORDER — 0.9 % SODIUM CHLORIDE (POUR BTL) OPTIME
TOPICAL | Status: DC | PRN
  Administered 2022-09-26: 1000 mL

## 2022-09-26 MED ORDER — CEFAZOLIN SODIUM-DEXTROSE 2-4 GM/100ML-% IV SOLN
2.0000 g | INTRAVENOUS | Status: AC
  Administered 2022-09-26: 2 g via INTRAVENOUS
  Filled 2022-09-26: qty 100

## 2022-09-26 MED ORDER — HEPARIN 6000 UNIT IRRIGATION SOLUTION
Status: DC | PRN
  Administered 2022-09-26: 1

## 2022-09-26 MED ORDER — ACETAMINOPHEN 500 MG PO TABS
1000.0000 mg | ORAL_TABLET | Freq: Once | ORAL | Status: AC
  Administered 2022-09-26: 1000 mg via ORAL
  Filled 2022-09-26: qty 2

## 2022-09-26 SURGICAL SUPPLY — 67 items
ADH SKN CLS APL DERMABOND .7 (GAUZE/BANDAGES/DRESSINGS) ×2
AGENT HMST KT MTR STRL THRMB (HEMOSTASIS) ×2
APL PRP STRL LF ISPRP CHG 10.5 (MISCELLANEOUS) ×2
APPLICATOR CHLORAPREP 10.5 ORG (MISCELLANEOUS) ×2 IMPLANT
ARMBAND PINK RESTRICT EXTREMIT (MISCELLANEOUS) ×2 IMPLANT
BAG BANDED W/RUBBER/TAPE 36X54 (MISCELLANEOUS) ×2 IMPLANT
BAG COUNTER SPONGE SURGICOUNT (BAG) ×2 IMPLANT
BAG EQP BAND 135X91 W/RBR TAPE (MISCELLANEOUS) ×2
BAG SPNG CNTER NS LX DISP (BAG) ×2
CANISTER SUCT 3000ML PPV (MISCELLANEOUS) ×2 IMPLANT
CLIP TI MEDIUM 24 (CLIP) IMPLANT
CLIP TI MEDIUM 6 (CLIP) IMPLANT
CLIP TI WIDE RED SMALL 24 (CLIP) IMPLANT
CLIP TI WIDE RED SMALL 6 (CLIP) IMPLANT
CNTNR URN SCR LID CUP LEK RST (MISCELLANEOUS) ×2 IMPLANT
CONT SPEC 4OZ STRL OR WHT (MISCELLANEOUS) ×2
COVER DOME SNAP 22 D (MISCELLANEOUS) ×2 IMPLANT
COVER PROBE W GEL 5X96 (DRAPES) ×2 IMPLANT
DERMABOND ADVANCED .7 DNX12 (GAUZE/BANDAGES/DRESSINGS) ×2 IMPLANT
DRAPE BRACHIAL (DRAPES) ×2 IMPLANT
DRSG TEGADERM 4X4.75 (GAUZE/BANDAGES/DRESSINGS) ×2 IMPLANT
ELECT REM PT RETURN 9FT ADLT (ELECTROSURGICAL) ×2
ELECTRODE REM PT RTRN 9FT ADLT (ELECTROSURGICAL) ×2 IMPLANT
GEL ULTRASOUND 8.5O AQUASONIC (MISCELLANEOUS) ×2 IMPLANT
GLOVE SURG SS PI 7.5 STRL IVOR (GLOVE) ×4 IMPLANT
GOWN STRL REUS W/ TWL LRG LVL3 (GOWN DISPOSABLE) ×6 IMPLANT
GOWN STRL REUS W/ TWL XL LVL3 (GOWN DISPOSABLE) ×2 IMPLANT
GOWN STRL REUS W/TWL LRG LVL3 (GOWN DISPOSABLE) ×6
GOWN STRL REUS W/TWL XL LVL3 (GOWN DISPOSABLE) ×2
HEMOSTAT SNOW SURGICEL 2X4 (HEMOSTASIS) IMPLANT
KIT BASIN OR (CUSTOM PROCEDURE TRAY) ×2 IMPLANT
KIT ENCORE 26 ADVANTAGE (KITS) IMPLANT
KIT TURNOVER KIT B (KITS) ×2 IMPLANT
NDL 18GX1X1/2 (RX/OR ONLY) (NEEDLE) ×1 IMPLANT
NDL PERC 18GX7CM (NEEDLE) IMPLANT
NEEDLE 18GX1X1/2 (RX/OR ONLY) (NEEDLE) ×2 IMPLANT
NEEDLE PERC 18GX7CM (NEEDLE) IMPLANT
NS IRRIG 1000ML POUR BTL (IV SOLUTION) ×2 IMPLANT
PACK CV ACCESS (CUSTOM PROCEDURE TRAY) ×2 IMPLANT
PACK ENDO MINOR (CUSTOM PROCEDURE TRAY) IMPLANT
PAD ARMBOARD 7.5X6 YLW CONV (MISCELLANEOUS) ×4 IMPLANT
SET MICROPUNCTURE 5F STIFF (MISCELLANEOUS) ×2 IMPLANT
SLING ARM FOAM STRAP LRG (SOFTGOODS) IMPLANT
SLING ARM FOAM STRAP MED (SOFTGOODS) IMPLANT
SPONGE T-LAP 18X18 ~~LOC~~+RFID (SPONGE) ×1 IMPLANT
STOPCOCK MORSE 400PSI 3WAY (MISCELLANEOUS) ×2 IMPLANT
SURGIFLO W/THROMBIN 8M KIT (HEMOSTASIS) ×1 IMPLANT
SUT PROLENE 6 0 BV (SUTURE) ×2 IMPLANT
SUT SILK 2 0 PERMA HAND 18 BK (SUTURE) ×1 IMPLANT
SUT SILK 2 0 SH (SUTURE) IMPLANT
SUT SILK 3 0 (SUTURE) ×2
SUT SILK 3-0 18XBRD TIE 12 (SUTURE) ×1 IMPLANT
SUT VIC AB 3-0 SH 27 (SUTURE) ×4
SUT VIC AB 3-0 SH 27X BRD (SUTURE) ×3 IMPLANT
SUT VIC AB 4-0 PS2 18 (SUTURE) ×1 IMPLANT
SUT VICRYL 4-0 PS2 18IN ABS (SUTURE) ×2 IMPLANT
SYR 10ML LL (SYRINGE) ×6 IMPLANT
SYR 20ML LL LF (SYRINGE) ×4 IMPLANT
SYR 30ML LL (SYRINGE) ×2 IMPLANT
SYR 50ML LL SCALE MARK (SYRINGE) ×1 IMPLANT
SYR CONTROL 10ML LL (SYRINGE) ×2 IMPLANT
TOWEL GREEN STERILE (TOWEL DISPOSABLE) ×2 IMPLANT
TUBE CONNECTING 12X1/4 (SUCTIONS) ×1 IMPLANT
TUBING CIL FLEX 10 FLL-RA (TUBING) ×2 IMPLANT
UNDERPAD 30X36 HEAVY ABSORB (UNDERPADS AND DIAPERS) ×2 IMPLANT
WATER STERILE IRR 1000ML POUR (IV SOLUTION) ×2 IMPLANT
WIRE BENTSON .035X145CM (WIRE) IMPLANT

## 2022-09-26 NOTE — Anesthesia Postprocedure Evaluation (Signed)
Anesthesia Post Note  Patient: [name]  Procedure(s) Performed: RIGHT ARM SECOND STAGE BASILIC VEIN TRANSPOSITION (Right: Arm Upper)     Patient location during evaluation: PACU Anesthesia Type: General Level of consciousness: awake and alert Pain management: pain level controlled Vital Signs Assessment: post-procedure vital signs reviewed and stable Respiratory status: spontaneous breathing, nonlabored ventilation, respiratory function stable and patient connected to nasal cannula oxygen Cardiovascular status: blood pressure returned to baseline and stable Postop Assessment: no apparent nausea or vomiting Anesthetic complications: no  No notable events documented.  Last Vitals:  Vitals:   09/26/22 1415 09/26/22 1430  BP: (!) 104/59 104/61  Pulse: 65 64  Resp: (!) 21 (!) 21  Temp:  36.7 C  SpO2: 97% 97%    Last Pain:  Vitals:   09/26/22 1351  TempSrc:   PainSc: 0-No pain                 Kennieth Rad

## 2022-09-26 NOTE — Op Note (Signed)
    Patient name: [name] MRN: [id number] DOB: [birth year] Sex: male  09/26/2022 Pre-operative Diagnosis: ESRD Post-operative diagnosis:  Same Surgeon:  Durene Cal Assistants:  Adonis Housekeeper, PA Procedure:   Revision of right brachiobasilic fistula (second stage procedure with redo arterial anastomosis) Anesthesia:  General Blood Loss:  minimal Specimens:  none  Findings: The proximal portion of the fistula vein was sclerotic and so it was excluded.  I redid the arterial anastomosis to a more proximal brachial artery.  Indications: This is a 62 year old gentleman who underwent for stage basilic vein fistula creation who comes in now for his second stage.  Preoperative ultrasound suggested a stenosis in the proximal portion of the vein near the anastomosis.  Procedure:  The patient was identified in the holding area and taken to [facility] OR ROOM 11  The patient was then placed supine on the table. general anesthesia was administered.  The patient was prepped and draped in the usual sterile fashion.  A time out was called and antibiotics were administered.  A PA was necessary to expedite the procedure and assist with technical details.  She help with exposure by providing suction and retraction.  She help with the anastomoses by following the suture.  She help with wound closure.  Ultrasound was used to mark the course of the basilic vein in the right upper arm.  Near the anastomosis, the vein appeared to be very small.  Beyond this it was healthy.  I made 2 longitudinal incisions over top of the vein and dissected out the basilic vein.  The nerve was protected and side branches were ligated between silk ties.  The vein was mobilized from the axilla down to the antecubital crease.  The first 2 to 3 cm of the fistula from the anastomosis appeared to be sclerotic.  This vein did not appear to be healthy.  Therefore, I elected to transect the fistula at this level.  I then exposed a more proximal brachial  artery for revision.  The vein was infiltrated with heparinized saline.  It distended nicely.  It was marked for orientation.  I placed Exparel in the anticipated tunnel and then created a tunnel with a curved Gore tunneler.  The fistula was then brought through the tunnel, making sure it was not twisted.  I then occluded the brachial artery with vascular clamps.  A #11 blade was used to make an arteriotomy which was extended longitudinally with Potts scissors.  The vein was cut the appropriate length and spatulated to fit the size the arteriotomy.  A running anastomosis was created with 6-0 Prolene.  Prior to completion the appropriate flushing maneuvers were performed and the anastomosis was completed.  Clamp was then released.  There was excellent flow through the fistula.  There was decreased blood flow to the hand however this did not change significantly with fistula compression. Was then irrigated.  Hemostasis was achieved.  The subcutaneous tissue was closed with 3-0 Vicryl.  The skin was closed with Monocryl.  Dermabond was applied.  There were no immediate complications.   Disposition: To PACU stable.   Juleen China, M.D., [facility] [place] Office: [phone] Pager:  [phone]

## 2022-09-26 NOTE — Transfer of Care (Signed)
Immediate Anesthesia Transfer of Care Note  Patient: [name]  Procedure(s) Performed: RIGHT ARM SECOND STAGE BASILIC VEIN TRANSPOSITION (Right: Arm Upper)  Patient Location: PACU  Anesthesia Type:General  Level of Consciousness: awake and drowsy  Airway & Oxygen Therapy: Patient Spontanous Breathing  Post-op Assessment: Report given to RN and Post -op Vital signs reviewed and stable  Post vital signs: Reviewed and stable  Last Vitals:  Vitals Value Taken Time  BP 153/51 09/26/22 1348  Temp    Pulse 66 09/26/22 1349  Resp 22 09/26/22 1349  SpO2 97 % 09/26/22 1349  Vitals shown include unfiled device data.  Last Pain:  Vitals:   09/26/22 0832  TempSrc:   PainSc: 0-No pain         Complications: No notable events documented.

## 2022-09-26 NOTE — Anesthesia Preprocedure Evaluation (Signed)
Anesthesia Evaluation  Patient identified by MRN, date of birth, ID band Patient awake    Reviewed: Allergy & Precautions, NPO status , Patient's Chart, lab work & pertinent test results  History of Anesthesia Complications Negative for: history of anesthetic complications  Airway Mallampati: III  TM Distance: >3 FB Neck ROM: Full    Dental no notable dental hx. (+) Dental Advisory Given,    Pulmonary Patient abstained from smoking., former smoker   Pulmonary exam normal        Cardiovascular hypertension, Pt. on medications and Pt. on home beta blockers + CAD, + Past MI (2017), + Cardiac Stents and + CABG  Normal cardiovascular exam  Echo (Limited) 10/23/21: IMPRESSIONS   1. Left ventricular ejection fraction, by estimation, is 65 to 70%. The  left ventricle has normal function. The left ventricle has no regional  wall motion abnormalities. There is mild concentric left ventricular  hypertrophy. Left ventricular diastolic  parameters are consistent with Grade I diastolic dysfunction (impaired  relaxation).   2. Right ventricular systolic function is mildly reduced. The right  ventricular size is normal.   3. Aortic valve regurgitation is trivial. No aortic stenosis is present.   4. The mitral valve was not well visualized. No evidence of mitral valve  regurgitation.   5. Technically very difficult echo, limited images available.       Neuro/Psych CVA  negative psych ROS   GI/Hepatic negative GI ROS, Neg liver ROS,,,  Endo/Other  diabetes, Type 2, Insulin Dependent    Renal/GU Renal disease  negative genitourinary   Musculoskeletal negative musculoskeletal ROS (+)    Abdominal   Peds  Hematology negative hematology ROS (+)   Anesthesia Other Findings Day of surgery medications reviewed with patient.  Reproductive/Obstetrics negative OB ROS                             Anesthesia  Physical Anesthesia Plan  ASA: 4  Anesthesia Plan: General   Post-op Pain Management: Tylenol PO (pre-op)*   Induction: Intravenous  PONV Risk Score and Plan: 2 and Treatment may vary due to age or medical condition, Ondansetron, Dexamethasone and Midazolam  Airway Management Planned: LMA  Additional Equipment: None  Intra-op Plan:   Post-operative Plan: Extubation in OR  Informed Consent: I have reviewed the patients History and Physical, chart, labs and discussed the procedure including the risks, benefits and alternatives for the proposed anesthesia with the patient or authorized representative who has indicated his/her understanding and acceptance.     Dental advisory given  Plan Discussed with: Anesthesiologist and CRNA  Anesthesia Plan Comments: (See PAT note written 03/13/2022 by Shonna Chock, PA-C. S/p CABG 09/12/21. Readmission 09/28/21-12/03/21 for pericardial effusion, cardiogenic shock, s/p right VATS for drainage of pericardial and right pleural effusions 09/28/21, ECMO->Impella LVAD 10/07/21-10/17/21; tracheostomy 10/17/21 - 11/05/21. Post-CABG PAF (amiodarone discontinued 10/23/21 due to junctional bradycardia, no anticoagulation due to recurrent mediastinal bleeding). 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 DM2.    )        Anesthesia Quick Evaluation

## 2022-09-26 NOTE — H&P (Signed)
Office Note        CC:  follow up Requesting Provider:  Arita Miss, MD   HPI: [name] is a 62 y.o. (1960-05-02) male who presents status post for stage basilic vein fistula creation by Dr. Myra Gianotti in February of this year.  Patient missed his postoperative appointment.  He is currently dialyzing via right IJ TDC without complication.  He denies any steal symptoms in his right hand.  He does not take any blood thinners.         Past Medical History:  Diagnosis Date   AKI (acute kidney injury) [facility])      pt unaware of this   Anginal pain (HCC)     CAD (coronary artery disease)      a. 03/2015 NSTEMI: LHC with severe 3V CAD  (70% mid RCA, 95% OM1, 90% distal LCx, 90% OM3, 80% prox LAD and 90% ost D1) s/p DES to mLAD w/ small dissction Rx with DES, staged ost Ramus PCI/DES and dLCx s/p PCI/DES    Chest pain 12/24/2020   Diabetes mellitus type 2 in obese     Diverticulosis     Dyspnea     Dyspnea on exertion 03/16/2015    Dyspnea on exertion   ESRD (end stage renal disease) on dialysis [facility])     Family history of adverse reaction to anesthesia      patient father- pt states after anesthesia his father "developed dementia"   GERD (gastroesophageal reflux disease)     Hypercholesteremia     Hypertension associated with diabetes (HCC) 03/16/2015    hypertension   NSTEMI (non-ST elevated myocardial infarction) (HCC) 03/17/2015   Obesity     Stroke (HCC) 2022    pt states he had a "mini stroke" during cardiac catheterization   Tobacco abuse                 Past Surgical History:  Procedure Laterality Date   APPLICATION OF WOUND VAC N/A 10/03/2021    Procedure: APPLICATION OF WOUND VAC;  Surgeon: Lovett Sox, MD;  Location: [facility] OR;  Service: Thoracic;  Laterality: N/A;   AV FISTULA PLACEMENT Right 03/14/2022    Procedure: RIGHT ARM ARTERIOVENOUS (AV) FISTULA VERSUS ARTERIOVENOUS GRAFT CREATION;  Surgeon: Nada Libman, MD;  Location: [facility] OR;  Service: Vascular;   Laterality: Right;   CANNULATION FOR ECMO (EXTRACORPOREAL MEMBRANE OXYGENATION) N/A 09/29/2021    Procedure: CANNULATION FOR ECMO (EXTRACORPOREAL MEMBRANE OXYGENATION);  Surgeon: Corliss Skains, MD;  Location: [facility] OR;  Service: Open Heart Surgery;  Laterality: N/A;   CARDIAC CATHETERIZATION N/A 03/19/2015    Procedure: Left Heart Cath and Coronary Angiography;  Surgeon: Runell Gess, MD;  Location: [facility] INVASIVE CV LAB;  Service: Cardiovascular;  Laterality: N/A;   CARDIAC CATHETERIZATION N/A 03/20/2015    Procedure: Coronary Stent Intervention;  Surgeon: Runell Gess, MD;  Location: [facility] INVASIVE CV LAB;  Service: Cardiovascular;  Laterality: N/A;   CARDIAC CATHETERIZATION N/A 03/22/2015    Procedure: Coronary Stent Intervention;  Surgeon: Runell Gess, MD;  Location: [facility] INVASIVE CV LAB;  Service: Cardiovascular;  Laterality: N/A;   CORONARY ARTERY BYPASS GRAFT N/A 09/12/2021    Procedure: CORONARY ARTERY BYPASS GRAFTING (CABG) X 5 USING LEFT INTERNAL MAMMARY ARTERY AND ENDOSCOPICALLY HARVESTED RIGHT GREATER SAPHENOUS VEIN.;  Surgeon: Alleen Borne, MD;  Location: [facility] OR;  Service: Open Heart Surgery;  Laterality: N/A;   EXPLORATION POST OPERATIVE OPEN HEART N/A 09/30/2021    Procedure: EXPLORATION  POST OPERATIVE OPEN HEART WASHOUT;  Surgeon: Corliss Skains, MD;  Location[facility] OR;  Service: Open Heart Surgery;  Laterality: N/A;   IR FLUORO GUIDE CV LINE LEFT   11/04/2021   IR FLUORO GUIDE CV LINE LEFT   12/06/2021   IR FLUORO GUIDE CV LINE LEFT   04/11/2022   IR GASTROSTOMY TUBE MOD SED   11/15/2021   IR GASTROSTOMY TUBE MOD SED   11/20/2021   IR GASTROSTOMY TUBE REMOVAL   01/20/2022   IR US GUIDE VASC ACCESS LEFT   11/04/2021   IR VENOCAVAGRAM SVC   12/06/2021   IR VENOCAVAGRAM SVC   04/11/2022   LEFT HEART CATH AND CORONARY ANGIOGRAPHY N/A 12/25/2020    Procedure: LEFT HEART CATH AND CORONARY ANGIOGRAPHY;  Surgeon: [name], MD;  [facility]: [facility] INVASIVE CV LAB;  Service:  Cardiovascular;  Laterality: N/A;   LEFT HEART CATH AND CORONARY ANGIOGRAPHY N/A 12/26/2020    Procedure: LEFT HEART CATH AND CORONARY ANGIOGRAPHY;  Surgeon: [name], MD;  Location: [facility] INVASIVE CV LAB;  Service: Cardiovascular;  Laterality: N/A;   MEDIASTINAL EXPLORATION N/A 10/03/2021    Procedure: MEDIASTINAL WASHOUT;  Surgeon: Lovett Sox, MD;  Location: [facility] OR;  Service: Thoracic;  Laterality: N/A;  PUMP STANDBY   MEDIASTINAL EXPLORATION N/A 10/12/2021    Procedure: MEDIASTINAL WASHOUT;  Surgeon: Alleen Borne, MD;  Location: [facility] OR;  Service: Thoracic;  Laterality: N/A;   PERCUTANEOUS TRACHEOSTOMY N/A 10/17/2021    Procedure: PERCUTANEOUS TRACHEOSTOMY USING SHILEY FLEXIBLE 8 mm CUFFED TRACH.;  Surgeon: Lorin Glass, MD;  Location: [facility] OR;  Service: Pulmonary;  Laterality: N/A;  Percutaneous tracheostomy   PERICARDIAL WINDOW Right 09/28/2021    Procedure: PERICARDIAL WINDOW;  Surgeon: Corliss Skains, MD;  Location: [facility] OR;  Service: Thoracic;  Laterality: Right;  Right VATS.  Lazy lateral.  double lumen ET tube   PLACEMENT OF IMPELLA LEFT VENTRICULAR ASSIST DEVICE N/A 10/07/2021    Procedure: INSERTION OF RIGHT AXILLARY IMPELLA, DECANNULATION OF ECMO, AND MEDIASTINAL WASHOUT;  Surgeon: Alleen Borne, MD;  Location: [facility] OR;  Service: Open Heart Surgery;  Laterality: N/A;  Open right axillary with 8 mm Hemashield Platinum Vascular graft.   RADIOLOGY WITH ANESTHESIA N/A 11/20/2021    Procedure: G-Tube Placement;  Surgeon: Simonne Come, MD;  Location: [facility] OR;  Service: Radiology;  Laterality: N/A;   REMOVAL OF IMPELLA LEFT VENTRICULAR ASSIST DEVICE Right 10/17/2021    Procedure: REMOVAL OF IMPELLA LEFT VENTRICULAR ASSIST DEVICE;  Surgeon: Alleen Borne, MD;  Location: [facility] OR;  Service: Open Heart Surgery;  Laterality: Right;   STERNAL CLOSURE N/A 10/12/2021    Procedure: STERNAL CLOSURE;  Surgeon: Alleen Borne, MD;  Location: [facility] OR;  Service: Thoracic;  Laterality: N/A;   TEE WITHOUT  CARDIOVERSION N/A 09/12/2021    Procedure: TRANSESOPHAGEAL ECHOCARDIOGRAM (TEE);  Surgeon: Alleen Borne, MD;  Location: [facility] OR;  Service: Open Heart Surgery;  Laterality: N/A;   TEE WITHOUT CARDIOVERSION N/A 09/29/2021    Procedure: TRANSESOPHAGEAL ECHOCARDIOGRAM (TEE);  Surgeon: Corliss Skains, MD;  Location: [facility] OR;  Service: Open Heart Surgery;  Laterality: N/A;   TEE WITHOUT CARDIOVERSION N/A 10/03/2021    Procedure: TRANSESOPHAGEAL ECHOCARDIOGRAM (TEE);  Surgeon: Lovett Sox, MD;  Location: [facility] OR;  Service: Thoracic;  Laterality: N/A;   TEE WITHOUT CARDIOVERSION   10/07/2021    Procedure: TRANSESOPHAGEAL ECHOCARDIOGRAM (TEE);  Surgeon: Alleen Borne, MD;  Location: [facility] OR;  Service: Open Heart Surgery;;  TEE WITHOUT CARDIOVERSION N/A 10/12/2021    Procedure: TRANSESOPHAGEAL ECHOCARDIOGRAM (TEE);  Surgeon: Alleen Borne, MD;  Location: [facility] OR;  Service: Thoracic;  Laterality: N/A;   TEE WITHOUT CARDIOVERSION N/A 10/17/2021    Procedure: TRANSESOPHAGEAL ECHOCARDIOGRAM (TEE);  Surgeon: Alleen Borne, MD;  Location: [facility] OR;  Service: Open Heart Surgery;  Laterality: N/A;   TESTICLE SURGERY   1970    pt states testicle was ascended and had to be "pulled down"   TONSILLECTOMY        as a child          Social History         Socioeconomic History   Marital status: Legally Separated      Spouse name: Not on file   Number of children: 0   Years of education: Not on file   Highest education level: Not on file  Occupational History   Not on file  Tobacco Use   Smoking status: Former      Packs/day: .5      Types: Cigarettes      Quit date: 2017      Years since quitting: 7.4   Smokeless tobacco: Never  Vaping Use   Vaping Use: Former   Start date: 10/14/2017   Quit date: 09/09/2021  Substance and Sexual Activity   Alcohol use: Not Currently      Comment: very occasional, maybe a beer or mixed drink once every few months   Drug use: Not Currently   Sexual activity: Not on  file  Other Topics Concern   Not on file  Social History Narrative   Not on file    Social Determinants of Health        Financial Resource Strain: Low Risk  (12/06/2021)    Overall Financial Resource Strain (CARDIA)     Difficulty of Paying Living Expenses: Not hard at all  Food Insecurity: No Food Insecurity (12/06/2021)    Hunger Vital Sign     Worried About Running Out of Food in the Last Year: Never true     Ran Out of Food in the Last Year: Never true  Transportation Needs: No Transportation Needs (12/06/2021)    PRAPARE - Therapist, art (Medical): No     Lack of Transportation (Non-Medical): No  Physical Activity: Not on file  Stress: Not on file  Social Connections: Not on file  Intimate Partner Violence: Not At Risk (12/06/2021)    Humiliation, Afraid, Rape, and Kick questionnaire     Fear of Current or Ex-Partner: No     Emotionally Abused: No     Physically Abused: No     Sexually Abused: No    History reviewed. No pertinent family history.             Current Outpatient Medications  Medication Sig Dispense Refill   acetaminophen (TYLENOL) 325 MG tablet Take 2 tablets (650 mg total) by mouth every 6 (six) hours as needed for mild pain, headache or fever.       Amino Acids-Protein Hydrolys (FEEDING SUPPLEMENT, PRO-STAT 64,) LIQD Take 30 mLs by mouth 2 (two) times daily.       amiodarone (PACERONE) 200 MG tablet Take 1 tablet (200 mg total) by mouth daily. 30 tablet 1   apixaban (ELIQUIS) 5 MG TABS tablet Take 1 tablet (5 mg total) by mouth 2 (two) times daily. 60 tablet 2   aspirin 81 MG chewable tablet Chew 1 tablet (  81 mg total) by mouth daily.       atorvastatin (LIPITOR) 80 MG tablet Take 1 tablet (80 mg total) by mouth daily. 90 tablet 2   Continuous Blood Gluc Sensor (FREESTYLE LIBRE 2 SENSOR) MISC Utilize as directed q 14 days to monitor blood sugar. 2 each 1   folic acid (FOLVITE) 1 MG tablet Take 1 tablet (1 mg total) by mouth  daily.       glucose blood (TRUE METRIX BLOOD GLUCOSE TEST) test strip Use to check blood sugar three times daily. 100 each 2   insulin aspart (NOVOLOG) 100 UNIT/ML injection Inject 0-6 Units into the skin every 4 (four) hours. CBG 70-150 ( 0 units) 151-200 (1 unit) 201-250 (2 units) 251-300 ( 3 units) 301-350 ( 4 units) 351-400 (5 units) >400 ( 6 units) 10 mL 11   Insulin Pen Needle (TRUEPLUS 5-BEVEL PEN NEEDLES) 32G X 4 MM MISC Use to inject Basaglar once daily. 100 each 3   metoprolol succinate (TOPROL-XL) 25 MG 24 hr tablet Take 1 tablet (25 mg total) by mouth daily. 30 tablet 2   multivitamin (RENA-VIT) TABS tablet Take 1 tablet by mouth at bedtime.   0   pantoprazole (PROTONIX) 40 MG tablet Take 1 tablet (40 mg total) by mouth daily.       sevelamer carbonate (RENVELA) 800 MG tablet Take 2 tablets (1,600 mg total) by mouth 3 (three) times daily with meals. 540 tablet 12   sevelamer carbonate (RENVELA) 800 MG tablet Take 1 tablet (800 mg total) by mouth 3 (three) times daily with meals. 270 tablet 12   TRUEplus Lancets 28G MISC Use to check blood sugar three times daily. 100 each 2      No current facility-administered medications for this visit.        Allergies  No Known Allergies       REVIEW OF SYSTEMS:    [X]  denotes positive finding, [ ]  denotes negative finding Cardiac   Comments:  Chest pain or chest pressure:      Shortness of breath upon exertion:      Short of breath when lying flat:      Irregular heart rhythm:             Vascular      Pain in calf, thigh, or hip brought on by ambulation:      Pain in feet at night that wakes you up from your sleep:       Blood clot in your veins:      Leg swelling:              Pulmonary      Oxygen at home:      Productive cough:       Wheezing:              Neurologic      Sudden weakness in arms or legs:       Sudden numbness in arms or legs:       Sudden onset of difficulty speaking or slurred speech:       Temporary loss of vision in one eye:       Problems with dizziness:              Gastrointestinal      Blood in stool:       Vomited blood:              Genitourinary      Burning when urinating:  Blood in urine:             Psychiatric      Major depression:              Hematologic      Bleeding problems:      Problems with blood clotting too easily:             Skin      Rashes or ulcers:             Constitutional      Fever or chills:          PHYSICAL EXAMINATION:      Vitals:    08/01/22 1303  BP: 100/66  Pulse: 75  Resp: 15  Temp: 97.7 F (36.5 C)  TempSrc: Temporal  SpO2: 97%  Weight: 185 lb (83.9 kg)  Height: 5\' 7"  (1.702 m)      General:  WDWN in NAD; vital signs documented above Gait: Not observed HENT: WNL, normocephalic Pulmonary: normal non-labored breathing , without Rales, rhonchi,  wheezing Cardiac: regular HR Abdomen: soft, NT, no masses Skin: without rashes Vascular Exam/Pulses: Palpable thrill at the anastomosis in the right arm Extremities: Palpable right radial pulse; incision healed Musculoskeletal: no muscle wasting or atrophy       Neurologic: A&O X 3 Psychiatric:  The pt has Normal affect.     Non-Invasive Vascular Imaging:   Duplex demonstrating high velocity just beyond the anastomosis in the distal upper arm       ASSESSMENT/PLAN:: 62 y.o. male status post right arm basilic vein fistula creation   -Patent right brachiobasilic fistula with palpable thrill at the anastomosis.  Duplex demonstrates concerning elevated velocities just beyond the anastomosis in the distal upper arm.  Plan was discussed with Dr. Myra Gianotti.  Plan will be right arm fistulogram with possible intervention as well as second stage basilic vein transposition using either C arm or OR 16..  This will be scheduled with Dr. Myra Gianotti in the near future.  Case was discussed with the patient including risks and he is agreeable to proceed.     Emilie Rutter, PA-C [facility] [phone]

## 2022-09-26 NOTE — Anesthesia Procedure Notes (Signed)
Procedure Name: LMA Insertion Date/Time: 09/26/2022 11:22 AM  Performed by: Nils Pyle, CRNAPre-anesthesia Checklist: Patient identified, Emergency Drugs available, Suction available and Patient being monitored Patient Re-evaluated:Patient Re-evaluated prior to induction Oxygen Delivery Method: Circle System Utilized Preoxygenation: Pre-oxygenation with 100% oxygen Induction Type: IV induction Ventilation: Mask ventilation without difficulty LMA: LMA inserted LMA Size: 4.0 Number of attempts: 1 Airway Equipment and Method: Bite block Placement Confirmation: positive ETCO2 Tube secured with: Tape Dental Injury: Teeth and Oropharynx as per pre-operative assessment

## 2022-09-27 ENCOUNTER — Encounter (HOSPITAL_COMMUNITY): Payer: Self-pay | Admitting: Surgery

## 2022-09-29 ENCOUNTER — Ambulatory Visit: Payer: Medicare Other | Admitting: Occupational Therapy

## 2022-09-29 ENCOUNTER — Telehealth: Payer: Self-pay | Admitting: Surgery

## 2022-09-29 NOTE — Telephone Encounter (Signed)
-----   Message from [place] sent at 09/26/2022  1:40 PM EDT ----- S/p right 2nd stage basilic vein fistula by Dr. Myra Gianotti. He needs an incision check in 2-3 weeks as well as fistula duplex. Thanks

## 2022-10-02 NOTE — Telephone Encounter (Signed)
-----   Message from [place] sent at 09/26/2022  1:40 PM EDT ----- S/p right 2nd stage basilic vein fistula by Dr. Myra Gianotti. He needs an incision check in 2-3 weeks as well as fistula duplex. Thanks

## 2022-10-03 NOTE — Telephone Encounter (Signed)
Appt has been scheduled.

## 2022-10-06 ENCOUNTER — Other Ambulatory Visit: Payer: Self-pay

## 2022-10-06 ENCOUNTER — Telehealth: Payer: Self-pay | Admitting: Cardiology

## 2022-10-06 MED ORDER — AMIODARONE HCL 200 MG PO TABS
200.0000 mg | ORAL_TABLET | Freq: Every day | ORAL | 1 refills | Status: DC
  Filled 2022-10-06: qty 30, 30d supply, fill #0

## 2022-10-06 MED ORDER — METOPROLOL SUCCINATE ER 25 MG PO TB24
25.0000 mg | ORAL_TABLET | Freq: Every day | ORAL | 0 refills | Status: DC
  Filled 2022-10-06: qty 30, 30d supply, fill #0

## 2022-10-06 NOTE — Telephone Encounter (Signed)
*  STAT* If patient is at the pharmacy, call can be transferred to refill team.   1. Which medications need to be refilled? (please list name of each medication and dose if known) amiodarone (PACERONE) 200 MG tablet   atorvastatin (LIPITOR) 80 MG tablet    metoprolol succinate (TOPROL-XL) 25 MG 24 hr tablet    2. Which pharmacy/location (including street and city if local pharmacy) is medication to be sent to?  [facility]   3. Do they need a 30 day or 90 day supply? 90

## 2022-10-06 NOTE — Telephone Encounter (Signed)
Left pt a detailed message to let him know we moved his appointment up from November to 10/15/22 2:45.  Will send in 30 day refill for Amiodarone and Metoprolol

## 2022-10-07 ENCOUNTER — Other Ambulatory Visit: Payer: Self-pay

## 2022-10-11 ENCOUNTER — Emergency Department (HOSPITAL_COMMUNITY)
Admission: EM | Admit: 2022-10-11 | Discharge: 2022-10-12 | Discharge status: Against Medical Advice | Payer: Medicare Other | Attending: Emergency Medicine | Admitting: Emergency Medicine

## 2022-10-11 ENCOUNTER — Emergency Department (HOSPITAL_COMMUNITY): Payer: Medicare Other

## 2022-10-11 ENCOUNTER — Other Ambulatory Visit: Payer: Self-pay

## 2022-10-11 ENCOUNTER — Encounter (HOSPITAL_COMMUNITY): Payer: Self-pay

## 2022-10-11 DIAGNOSIS — I120 Hypertensive chronic kidney disease with stage 5 chronic kidney disease or end stage renal disease: Secondary | ICD-10-CM | POA: Insufficient documentation

## 2022-10-11 DIAGNOSIS — Z794 Long term (current) use of insulin: Secondary | ICD-10-CM | POA: Insufficient documentation

## 2022-10-11 DIAGNOSIS — R5383 Other fatigue: Secondary | ICD-10-CM | POA: Insufficient documentation

## 2022-10-11 DIAGNOSIS — N186 End stage renal disease: Secondary | ICD-10-CM | POA: Insufficient documentation

## 2022-10-11 DIAGNOSIS — Z7901 Long term (current) use of anticoagulants: Secondary | ICD-10-CM | POA: Insufficient documentation

## 2022-10-11 DIAGNOSIS — Z79899 Other long term (current) drug therapy: Secondary | ICD-10-CM | POA: Insufficient documentation

## 2022-10-11 DIAGNOSIS — E1122 Type 2 diabetes mellitus with diabetic chronic kidney disease: Secondary | ICD-10-CM | POA: Insufficient documentation

## 2022-10-11 DIAGNOSIS — Z5329 Procedure and treatment not carried out because of patient's decision for other reasons: Secondary | ICD-10-CM | POA: Diagnosis not present

## 2022-10-11 DIAGNOSIS — R7989 Other specified abnormal findings of blood chemistry: Secondary | ICD-10-CM | POA: Insufficient documentation

## 2022-10-11 DIAGNOSIS — R1110 Vomiting, unspecified: Secondary | ICD-10-CM | POA: Insufficient documentation

## 2022-10-11 DIAGNOSIS — Z20822 Contact with and (suspected) exposure to covid-19: Secondary | ICD-10-CM | POA: Insufficient documentation

## 2022-10-11 DIAGNOSIS — R531 Weakness: Secondary | ICD-10-CM | POA: Insufficient documentation

## 2022-10-11 DIAGNOSIS — I2510 Atherosclerotic heart disease of native coronary artery without angina pectoris: Secondary | ICD-10-CM | POA: Diagnosis not present

## 2022-10-11 DIAGNOSIS — Z992 Dependence on renal dialysis: Secondary | ICD-10-CM | POA: Diagnosis not present

## 2022-10-11 DIAGNOSIS — D72829 Elevated white blood cell count, unspecified: Secondary | ICD-10-CM | POA: Insufficient documentation

## 2022-10-11 LAB — COMPREHENSIVE METABOLIC PANEL
ALT: 14 U/L (ref 0–44)
AST: 23 U/L (ref 15–41)
Albumin: 4.7 g/dL (ref 3.5–5.0)
Alkaline Phosphatase: 149 U/L — ABNORMAL HIGH (ref 38–126)
Anion gap: 17 — ABNORMAL HIGH (ref 5–15)
BUN: 20 mg/dL (ref 8–23)
CO2: 28 mmol/L (ref 22–32)
Calcium: 8.9 mg/dL (ref 8.9–10.3)
Chloride: 92 mmol/L — ABNORMAL LOW (ref 98–111)
Creatinine, Ser: 5.33 mg/dL — ABNORMAL HIGH (ref 0.61–1.24)
GFR, Estimated: 11 mL/min — ABNORMAL LOW (ref >60)
Glucose, Bld: 141 mg/dL — ABNORMAL HIGH (ref 70–99)
Potassium: 4 mmol/L (ref 3.5–5.1)
Sodium: 137 mmol/L (ref 135–145)
Total Bilirubin: 0.7 mg/dL (ref 0.3–1.2)
Total Protein: 9.1 g/dL — ABNORMAL HIGH (ref 6.5–8.1)

## 2022-10-11 LAB — MAGNESIUM: Magnesium: 2.1 mg/dL (ref 1.7–2.4)

## 2022-10-11 LAB — CBC WITH DIFFERENTIAL/PLATELET
Abs Immature Granulocytes: 0.31 10*3/uL — ABNORMAL HIGH (ref 0.00–0.07)
Basophils Absolute: 0.1 10*3/uL (ref 0.0–0.1)
Basophils Relative: 1 %
Eosinophils Absolute: 0.1 10*3/uL (ref 0.0–0.5)
Eosinophils Relative: 1 %
HCT: 40.2 % (ref 39.0–52.0)
Hemoglobin: 13 g/dL (ref 13.0–17.0)
Immature Granulocytes: 2 %
Lymphocytes Relative: 6 %
Lymphs Abs: 1.2 10*3/uL (ref 0.7–4.0)
MCH: 31.4 pg (ref 26.0–34.0)
MCHC: 32.3 g/dL (ref 30.0–36.0)
MCV: 97.1 fL (ref 80.0–100.0)
Monocytes Absolute: 1.5 10*3/uL — ABNORMAL HIGH (ref 0.1–1.0)
Monocytes Relative: 8 %
Neutro Abs: 15.9 10*3/uL — ABNORMAL HIGH (ref 1.7–7.7)
Neutrophils Relative %: 82 %
Platelets: 231 10*3/uL (ref 150–400)
RBC: 4.14 MIL/uL — ABNORMAL LOW (ref 4.22–5.81)
RDW: 14.6 % (ref 11.5–15.5)
WBC: 19.2 10*3/uL — ABNORMAL HIGH (ref 4.0–10.5)
nRBC: 0 % (ref 0.0–0.2)

## 2022-10-11 LAB — TROPONIN I (HIGH SENSITIVITY)
Troponin I (High Sensitivity): 14 ng/L (ref <18)
Troponin I (High Sensitivity): 14 ng/L (ref <18)

## 2022-10-11 LAB — BRAIN NATRIURETIC PEPTIDE: B Natriuretic Peptide: 346.3 pg/mL — ABNORMAL HIGH (ref 0.0–100.0)

## 2022-10-11 LAB — RESP PANEL BY RT-PCR (RSV, FLU A&B, COVID)  RVPGX2
Influenza A by PCR: NEGATIVE
Influenza B by PCR: NEGATIVE
Resp Syncytial Virus by PCR: NEGATIVE
SARS Coronavirus 2 by RT PCR: NEGATIVE

## 2022-10-11 MED ORDER — VANCOMYCIN HCL 1750 MG/350ML IV SOLN
1750.0000 mg | Freq: Once | INTRAVENOUS | Status: AC
  Administered 2022-10-11: 1750 mg via INTRAVENOUS
  Filled 2022-10-11: qty 350

## 2022-10-11 MED ORDER — SODIUM CHLORIDE 0.9 % IV BOLUS
500.0000 mL | Freq: Once | INTRAVENOUS | Status: AC
  Administered 2022-10-11: 500 mL via INTRAVENOUS

## 2022-10-11 MED ORDER — SODIUM CHLORIDE 0.9 % IV SOLN
1.0000 g | Freq: Once | INTRAVENOUS | Status: AC
  Administered 2022-10-11: 1 g via INTRAVENOUS
  Filled 2022-10-11: qty 10

## 2022-10-11 NOTE — Discharge Instructions (Signed)
You are seen today for weakness.  Your labs are concerning for a possible infection.  It is very important that if you are having chest pain, shortness of breath, fever, weakness, any new symptoms they return for further care.  We will call you with the results of your blood culture or troponin are abnormal.  Please call your primary care doctor as soon as you can.

## 2022-10-11 NOTE — Progress Notes (Signed)
ED Pharmacy Antibiotic Sign Off An antibiotic consult was received from an ED provider for cefepime and vancomycin per pharmacy dosing for bacteremia. A chart review was completed to assess appropriateness.  The following one time order(s) were placed per pharmacy consult:  cefepime 1000 mg x 1 dose vancomycin 1750 mg x 1 dose  Further antibiotic and/or antibiotic pharmacy consults should be ordered by the admitting provider if indicated.   Thank you for allowing pharmacy to be a part of this patient's care.   Delmar Landau, PharmD, BCPS 10/11/2022 9:14 PM ED Clinical Pharmacist -  [phone]

## 2022-10-11 NOTE — ED Provider Notes (Signed)
EMERGENCY DEPARTMENT AT [facility] Provider Note   CSN: [id number] Arrival date & time: 10/11/22  1626     History {Add pertinent medical, surgical, social history, OB history to HPI:1} Chief Complaint  Patient presents with   Fatigue    [name] is a 62 y.o. male.  HPI     Home Medications Prior to Admission medications   Medication Sig Start Date End Date Taking? Authorizing Provider  acetaminophen (TYLENOL) 325 MG tablet Take 2 tablets (650 mg total) by mouth every 6 (six) hours as needed for mild pain, headache or fever. Patient not taking: Reported on 09/24/2022 12/03/21   Barrett, Rae Roam, PA-C  amiodarone (PACERONE) 200 MG tablet Take 1 tablet (200 mg total) by mouth daily. 10/06/22   Dyann Kief, PA-C  apixaban (ELIQUIS) 5 MG TABS tablet Take 1 tablet (5 mg total) by mouth 2 (two) times daily. Patient taking differently: Take 5 mg by mouth 2 (two) times daily. 08/28/22- has not taken in a month, I notified N[place], patient's neurologist is aware- she instructed patient to speak with his cardiologist. 05/16/22   Kathlen Mody, MD  atorvastatin (LIPITOR) 80 MG tablet Take 1 tablet (80 mg total) by mouth daily. 07/16/22   Hoy Register, MD  Continuous Blood Gluc Sensor (FREESTYLE LIBRE 2 SENSOR) MISC Utilize as directed q 14 days to monitor blood sugar. Patient taking differently: Utilize as directed q 14 days to monitor blood sugar.  08/28/22- patient does not have. 05/10/21   Hoy Register, MD  glucose blood (TRUE METRIX BLOOD GLUCOSE TEST) test strip Use to check blood sugar three times daily. Patient taking differently: Use to check blood sugar three times daily. Does not check 06/10/21   Hoy Register, MD  insulin aspart (NOVOLOG) 100 UNIT/ML injection Inject 0-6 Units into the skin every 4 (four) hours. CBG 70-150 ( 0 units) 151-200 (1 unit) 201-250 (2 units) 251-300 ( 3 units) 301-350 ( 4 units) 351-400 (5 units) >400 ( 6 units) Patient  not taking: Reported on 09/24/2022 12/03/21   Barrett, Erin R, PA-C  Insulin Pen Needle (TRUEPLUS 5-BEVEL PEN NEEDLES) 32G X 4 MM MISC Use to inject Basaglar once daily. 05/10/21   Hoy Register, MD  metoprolol succinate (TOPROL-XL) 25 MG 24 hr tablet Take 1 tablet (25 mg total) by mouth daily. 10/06/22   Dyann Kief, PA-C  multivitamin (RENA-VIT) TABS tablet Take 1 tablet by mouth at bedtime. 12/03/21   Barrett, Rae Roam, PA-C  oxyCODONE-acetaminophen (PERCOCET) 5-325 MG tablet Take 1 tablet by mouth every 6 (six) hours as needed for severe pain. 09/26/22 09/26/23  Baglia, Corrina, PA-C  pantoprazole (PROTONIX) 40 MG tablet Take 1 tablet (40 mg total) by mouth daily. Patient not taking: Reported on 09/24/2022 05/16/22   Kathlen Mody, MD  sevelamer carbonate (RENVELA) 800 MG tablet Take 1 tablet (800 mg total) by mouth 3 (three) times daily with meals. Patient not taking: Reported on 09/24/2022 05/29/22     TRUEplus Lancets 28G MISC Use to check blood sugar three times daily. 06/10/21   Hoy Register, MD      Allergies    Patient has no known allergies.    Review of Systems   Review of Systems  Physical Exam Updated Vital Signs Ht 5\' 7"  (1.702 m)   Wt 86.2 kg   BMI 29.76 kg/m  Physical Exam  ED Results / Procedures / Treatments   Labs (all labs ordered are listed, but only abnormal results  are displayed) Labs Reviewed - No data to display  EKG None  Radiology No results found.  Procedures Procedures  {Document cardiac monitor, telemetry assessment procedure when appropriate:1}  Medications Ordered in ED Medications - No data to display  ED Course/ Medical Decision Making/ A&P   {   Click here for ABCD2, HEART and other calculatorsREFRESH Note before signing :1}                              Medical Decision Making  ***  {Document critical care time when appropriate:1} {Document review of labs and clinical decision tools ie heart score, Chads2Vasc2 etc:1}  {Document  your independent review of radiology images, and any outside records:1} {Document your discussion with family members, caretakers, and with consultants:1} {Document social determinants of health affecting pt's care:1} {Document your decision making why or why not admission, treatments were needed:1} Final Clinical Impression(s) / ED Diagnoses Final diagnoses:  None    Rx / DC Orders ED Discharge Orders     None

## 2022-10-11 NOTE — ED Triage Notes (Signed)
Pt BIB EMS with c/o generalized weakness and fatigue during HD treatment today. Pt endorse n/v. Pt had hypotension episode during HD and was given 1L of NS by HD center. Symptoms started the last 15 minutes of HD. Pt has not missed any HD treatment recently.   92/64 CBG 162

## 2022-10-12 MED ORDER — LEVOFLOXACIN 250 MG PO TABS
250.0000 mg | ORAL_TABLET | ORAL | 0 refills | Status: DC
Start: 2022-10-13 — End: 2022-10-15
  Filled 2022-10-12: qty 6, 14d supply, fill #0

## 2022-10-12 MED ORDER — DOXYCYCLINE HYCLATE 100 MG PO CAPS
100.0000 mg | ORAL_CAPSULE | Freq: Two times a day (BID) | ORAL | 0 refills | Status: DC
  Filled 2022-10-12: qty 20, 10d supply, fill #0

## 2022-10-14 ENCOUNTER — Other Ambulatory Visit: Payer: Self-pay

## 2022-10-15 ENCOUNTER — Other Ambulatory Visit: Payer: Self-pay

## 2022-10-15 ENCOUNTER — Encounter: Payer: Self-pay | Admitting: Physician Assistant

## 2022-10-15 ENCOUNTER — Ambulatory Visit: Payer: Medicare Other | Attending: Physician Assistant | Admitting: Physician Assistant

## 2022-10-15 VITALS — BP 110/60 | HR 76 | Ht 67.0 in | Wt 194.8 lb

## 2022-10-15 DIAGNOSIS — I10 Essential (primary) hypertension: Secondary | ICD-10-CM | POA: Diagnosis present

## 2022-10-15 DIAGNOSIS — I480 Paroxysmal atrial fibrillation: Secondary | ICD-10-CM | POA: Insufficient documentation

## 2022-10-15 DIAGNOSIS — E785 Hyperlipidemia, unspecified: Secondary | ICD-10-CM | POA: Diagnosis present

## 2022-10-15 DIAGNOSIS — N186 End stage renal disease: Secondary | ICD-10-CM | POA: Insufficient documentation

## 2022-10-15 DIAGNOSIS — I2510 Atherosclerotic heart disease of native coronary artery without angina pectoris: Secondary | ICD-10-CM | POA: Insufficient documentation

## 2022-10-15 DIAGNOSIS — E1169 Type 2 diabetes mellitus with other specified complication: Secondary | ICD-10-CM | POA: Diagnosis present

## 2022-10-15 MED ORDER — APIXABAN 5 MG PO TABS
5.0000 mg | ORAL_TABLET | Freq: Two times a day (BID) | ORAL | 3 refills | Status: DC
  Filled 2022-10-15: qty 60, 30d supply, fill #0
  Filled 2022-12-08: qty 60, 30d supply, fill #1

## 2022-10-15 MED ORDER — AMIODARONE HCL 200 MG PO TABS
200.0000 mg | ORAL_TABLET | Freq: Every day | ORAL | 3 refills | Status: DC
  Filled 2022-10-15 – 2022-12-08 (×2): qty 90, 90d supply, fill #0
  Filled 2023-06-08: qty 90, 90d supply, fill #1

## 2022-10-15 MED ORDER — METOPROLOL SUCCINATE ER 25 MG PO TB24
25.0000 mg | ORAL_TABLET | Freq: Every day | ORAL | 3 refills | Status: DC
  Filled 2022-10-15 – 2022-12-08 (×2): qty 90, 90d supply, fill #0
  Filled 2023-06-08: qty 90, 90d supply, fill #1

## 2022-10-15 MED ORDER — ATORVASTATIN CALCIUM 80 MG PO TABS
80.0000 mg | ORAL_TABLET | Freq: Every day | ORAL | 3 refills | Status: DC
  Filled 2022-10-15 – 2022-10-16 (×3): qty 90, 90d supply, fill #0
  Filled 2023-02-13: qty 90, 90d supply, fill #1
  Filled 2023-08-14: qty 90, 90d supply, fill #2

## 2022-10-15 NOTE — Patient Instructions (Addendum)
Medication Instructions:  Your physician recommends that you continue on your current medications as directed. Please refer to the Current Medication list given to you today.  *If you need a refill on your cardiac medications before your next appointment, please call your pharmacy*   Lab Work: TODAY:  TSH  If you have labs (blood work) drawn today and your tests are completely normal, you will receive your results only by: MyChart Message (if you have MyChart) OR A paper copy in the mail If you have any lab test that is abnormal or we need to change your treatment, we will call you to review the results.   Testing/Procedures: None ordered   Follow-Up: At [facility], you and your health needs are our priority.  As part of our continuing mission to provide you with exceptional heart care, we have created designated Provider Care Teams.  These Care Teams include your primary Cardiologist (physician) and Advanced Practice Providers (APPs -  Physician Assistants and Nurse Practitioners) who all work together to provide you with the care you need, when you need it.  We recommend signing up for the patient portal called "MyChart".  Sign up information is provided on this After Visit Summary.  MyChart is used to connect with patients for Virtual Visits (Telemedicine).  Patients are able to view lab/test results, encounter notes, upcoming appointments, etc.  Non-urgent messages can be sent to your provider as well.   To learn more about what you can do with MyChart, go to [email or web address].    Your next appointment:   3 month(s)   01/14/23 arrive at 1:15.. come fasting to this appointment, as we will check your Choelsterol that day  Provider:   Tereso Newcomer, PA-C         Other Instructions

## 2022-10-15 NOTE — Progress Notes (Addendum)
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    A 62 year old male with a complex medical history presents for follow-up. He has a history of coronary artery disease, atrial fibrillation, NSTEMI, CVA, and ESRD requiring hemodialysis.  He had a non-STEMI in November 2022.  His hospitalization was complicated by embolic stroke.  Catheterization demonstrated multivessel disease and bypass was recommended.  This was delayed because of his stroke.  He ultimately underwent CABG surgery in August 2023, which was complicated by pericardial and pleural effusions, cardiogenic shock, and AKI requiring CRRT. He required VA ECMO and was transitioned to Impella. He also had a tracheostomy.  He was admitted in April 2024 with ectopic atrial tachycardia versus atrial flutter with rapid rate.  He was placed on  amiodarone with restoration of normal sinus rhythm at that time.  He was recently seen in the emergency room with vomiting and hypotension.  There was concern for infection of his graft and he was given antibiotics.  Blood cultures been negative so far.  He is here alone today.  He also reports weakness in his right arm that may be due to a previous stroke.  He has follow-up testing pending to confirm this.  He denies any current chest pain, pressure, tightness, or shortness of breath.  He has not had syncope or near syncope.      ROS:  See HPI No melena, hematochezia     Studies Reviewed:        Risk Assessment/Calculations:    CHA2DS2-VASc Score = 6   This indicates a 9.7% annual risk of stroke. The patient's score is based upon: CHF History: 1 HTN History: 1 Diabetes History: 1 Stroke History: 2 Vascular Disease History: 1 Age Score: 0 Gender Score: 0            Physical Exam:   VS:  BP 110/60   Pulse 76   Ht 5\' 7"  (1.702 m)   Wt 194 lb 12.8 oz (88.4 kg)   SpO2 98%   BMI 30.51 kg/m    Wt Readings from Last 3 Encounters:  10/15/22 194 lb 12.8 oz (88.4 kg)  10/11/22 190 lb (86.2 kg)  09/26/22 190 lb (86.2 kg)    Constitutional:      Appearance: Healthy appearance. Not in distress.  Neck:     Vascular: JVD  normal.  Pulmonary:     Breath sounds: Normal breath sounds. No wheezing. No rales.  Cardiovascular:     Normal rate. Regular rhythm.     Murmurs: There is a grade 1/6 systolic murmur at the ULSB.  Edema:    Peripheral edema absent.  Abdominal:     General: There is no distension.     Palpations: Abdomen is soft.      Assessment and Plan:     Coronary Artery Disease History of non-STEMI in 2022 with multivessel disease on cardiac catheterization.  He ultimately underwent CABG in August 2023.  Post bypass course was complicated with 49-month hospitalization between August and October 2023 notable for cardiogenic shock, progression end-stage renal disease,  pericardial effusion and pleural effusion.  He is currently doing well without chest pain to suggest angina. -Continue Lipitor 80mg  daily. -He does not require aspirin as he is on Eliquis -Plan to recheck fasting lipids at next visit. -Follow-up 3 months  Atrial Fibrillation Most recent EKG from the emergency room several days ago reviewed and demonstrates sinus rhythm.  Based upon age and weight, he should be on Eliquis 5 mg twice daily. -Continue Amiodarone 200mg  daily and Eliquis 5mg  twice daily. -Continue Metoprolol succinate 25mg  daily. -Arrange checking TSH -Encourage patient to get yearly eye exam.  End Stage Renal Disease On hemodialysis every Tuesday, Thursday, Saturday. -Continue current dialysis schedule.  Hypertension Controlled. -Continue current antihypertensive regimen.  Hyperlipidemia -Continue Lipitor 80mg  daily. -Plan to recheck fasting lipids at next visit.  Diabetes mellitus A1C was 6 in April 2024, no current medications for diabetes. -Recommend follow-up with primary care doctor to monitor A1C.        Dispo:  Return in about 3 months (around 01/14/2023) for Routine Follow Up, w/ Tereso Newcomer, PA-C.  Signed, Tereso Newcomer, PA-C

## 2022-10-16 ENCOUNTER — Other Ambulatory Visit: Payer: Self-pay

## 2022-10-16 LAB — CULTURE, BLOOD (SINGLE)
Culture: NO GROWTH
Culture: NO GROWTH
Special Requests: ADEQUATE
Special Requests: ADEQUATE

## 2022-10-20 ENCOUNTER — Other Ambulatory Visit: Payer: Self-pay

## 2022-10-20 ENCOUNTER — Telehealth: Payer: Self-pay | Admitting: Neurology

## 2022-10-20 ENCOUNTER — Ambulatory Visit (INDEPENDENT_AMBULATORY_CARE_PROVIDER_SITE_OTHER): Payer: Medicare Other | Admitting: Neurology

## 2022-10-20 ENCOUNTER — Ambulatory Visit (INDEPENDENT_AMBULATORY_CARE_PROVIDER_SITE_OTHER): Payer: Medicare Other | Admitting: Physician Assistant

## 2022-10-20 VITALS — BP 104/68 | HR 79 | Temp 98.4°F | Resp 18 | Ht 67.0 in | Wt 194.9 lb

## 2022-10-20 DIAGNOSIS — G5611 Other lesions of median nerve, right upper limb: Secondary | ICD-10-CM

## 2022-10-20 DIAGNOSIS — M62541 Muscle wasting and atrophy, not elsewhere classified, right hand: Secondary | ICD-10-CM | POA: Diagnosis not present

## 2022-10-20 DIAGNOSIS — R29898 Other symptoms and signs involving the musculoskeletal system: Secondary | ICD-10-CM | POA: Diagnosis not present

## 2022-10-20 DIAGNOSIS — G5621 Lesion of ulnar nerve, right upper limb: Secondary | ICD-10-CM

## 2022-10-20 DIAGNOSIS — N186 End stage renal disease: Secondary | ICD-10-CM

## 2022-10-20 NOTE — Progress Notes (Signed)
POST OPERATIVE OFFICE NOTE    CC:  F/u for surgery  HPI:  This is a 62 y.o. male who is s/p second stage basilic fistula creation  on 0/86/57 by Dr. Myra Gianotti.    Pt returns today for follow up.  Pt states he is weak on the right UE since Oct prior to fistula creation.  He has coolness to his finger tips as well.  No pain in the hand.  He has a working [facility] and has HD TTS on [address].   No Known Allergies  Current Outpatient Medications  Medication Sig Dispense Refill   amiodarone (PACERONE) 200 MG tablet Take 1 tablet (200 mg total) by mouth daily. 90 tablet 3   apixaban (ELIQUIS) 5 MG TABS tablet Take 1 tablet (5 mg total) by mouth 2 (two) times daily. 180 tablet 3   atorvastatin (LIPITOR) 80 MG tablet Take 1 tablet (80 mg total) by mouth daily. 90 tablet 3   metoprolol succinate (TOPROL-XL) 25 MG 24 hr tablet Take 1 tablet (25 mg total) by mouth daily. 90 tablet 3   No current facility-administered medications for this visit.     ROS:  See HPI  Physical Exam:     Findings:  +--------------------+----------+-----------------+--------+  AVF                PSV (cm/s)Flow Vol (mL/min)Comments  +--------------------+----------+-----------------+--------+  Native artery inflow   171           901                 +--------------------+----------+-----------------+--------+  AVF Anastomosis        356                               +--------------------+----------+-----------------+--------+     +------------+----------+-------------+----------+--------+  OUTFLOW VEINPSV (cm/s)Diameter (cm)Depth (cm)Describe  +------------+----------+-------------+----------+--------+  Prox UA         48        0.58        1.41             +------------+----------+-------------+----------+--------+  Mid UA         129        0.53        1.54             +------------+----------+-------------+----------+--------+  Dist UA        659        0.28         0.72             +------------+----------+-------------+----------+--------+        Summary:  Patent AVF with a change in diameter from 0.28cm to 0.709cm in the mid/  distal upper arm (velocities 659cm/s to 182cm/s).    Incision:  well healed Extremities:  motor intact with muscle waisting of the hand, grip weak, good thrill in fistula Neuro: Grossly intact L > R     Assessment/Plan:  This is a 62 y.o. male who is s/p:second stage basilic fistula creation   He has muscle waisting.  He is starting PT to assist with the weakness in the right UE and noted to have nerve damage.  He has doppler signals Palmer and ulnar brisk signals.     The fistula may be accessed on 10/28/22.  Once the fistula is usable he can have the [facility] removed.   F/U PRN.     Mosetta Pigeon PA-C [facility] [phone]   Clinic MD:  Myra Gianotti

## 2022-10-20 NOTE — Procedures (Signed)
  [facility]  [address] [place], [address]  [place], [place] [address] Tel: [phone] Fax: [phone] Test Date:  10/20/2022  Patient: [name] DOB: 1960-06-02 Physician: Jacquelyne Balint, MD  Sex: Male Height: 5\' 7"  Ref Phys: Jacquelyne Balint, MD  ID#: [id number]   Technician:    History: This is a 62 year old male with right hand weakness, numbness, and tingling.  Findings: High frequency (4.0-16.0 MHz) B-mode, nonvascular ultrasound of the right upper limb shows: Cross sectional areas (CSA) of bilateral median (palm to mid upper arm) is within normal limits. The right median nerve is lost at the area of fistula in right upper arm and CSA cannot be measured.  Wrist to forearm ratios of the right median nerve is increased (1.74). CSA of right ulnar (wrist to mid upper arm) nerve is increased at the elbow (13.3 mm2). There is subluxation but not dislocation of right ulnar nerve from the ulnar groove with maximal elbow flexion.  Impression: This is an abnormal study. The findings are most consistent with the following: Evidence of possible entrapment of the right median nerve at the wrist segment, based on an increased ratio comparing the cross sectional area (CSA) measurement of the median nerve at the wrist to the forearm. Although this finding may be seen in carpal tunnel syndrome, the more established criteria for ultrasonographic diagnosis (i.e., CSA> 13 mm2) is not present. No evidence of compression of the right median nerve at the level of the pronator teres (pronator teres syndrome). The right median nerve cannot be visualized at the level of mid-arm due to fistula and scarring. Focal enlargement of the right median nerve in this region cannot be excluded. Ultrasonographic evidence of entrapment or repetitive trauma of the right ulnar nerve at the elbow segment, based on swelling of the nerve at the ulnar groove with the joint held in 45 degrees of flexion. The nerve subluxes to  the tip of the medial epicondyle but does not dislocate from the ulnar groove with maximal elbow flexion. No other obvious lesion involving the adjacent bone or tendon is identified. No definite vascular abnormalities.    -------- Jacquelyne Balint, MD  Nerve Measurements   Site Area Segment Area Ratio Mobility Vascularity Comment   mm Norm   Norm     Right Median  Palm 7.9         Wrist 9.4  < 13.0         Forearm 5.4  < 10.7  Wrist - Forearm 1.74  < 1.50      Pronator teres 8.7  < 11.0         Right Ulnar  Wrist 4.6  < 10.0         Forearm 5.6  < 10.0  Elbow - Forearm 2.38      Distal elbow 5.9  < 10.0  Elbow - mid arm 1.90      Elbow *13.3  < 10.0         Proximal elbow 6.6  < 10.0         Mid-arm 7.0  < 10.0          Ultrasound Images:

## 2022-10-20 NOTE — Telephone Encounter (Signed)
I discussed the results of patient's neuromuscular ultrasound after the procedure today. 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   The right median nerve shows increased wrist to forearm ratio (1.74) that could be consistent with carpal tunnel syndrome, however, the more established criteria of CSA > 13 mm2 is not present. This also does not agree with EMG that did not show carpal tunnel, but a median neuropathy that localized to the forearm below the take off to the pronator teres. There was no focal swelling of the median nerve in the forearm. The median nerve was lost to ultrasound in the upper arm in the area of patient's fistula, so compression in this area with relative sparing of the fibers to pronator teres is possible.  This is a complex case. Given his level of intrinsic hand muscle atrophy, I am not sure intervention such as surgery would be helpful. It may prevent future damage and worsening though. I discussed this with patient who would like surgical referral, at least for the right ulnar neuropathy at the elbow to see if there is anything that might help function. I will place this referral for patient.  He will follow up as planned on 02/27/23 or sooner if needed.  All questions were answered.  Jacquelyne Balint, MD [facility]

## 2022-10-21 ENCOUNTER — Other Ambulatory Visit: Payer: Self-pay

## 2022-10-21 DIAGNOSIS — I4892 Unspecified atrial flutter: Secondary | ICD-10-CM

## 2022-10-21 DIAGNOSIS — G5611 Other lesions of median nerve, right upper limb: Secondary | ICD-10-CM

## 2022-10-24 ENCOUNTER — Other Ambulatory Visit: Payer: Self-pay

## 2022-12-08 ENCOUNTER — Other Ambulatory Visit: Payer: Self-pay

## 2022-12-26 ENCOUNTER — Ambulatory Visit: Payer: Medicare Other | Admitting: Physician Assistant

## 2023-01-14 ENCOUNTER — Ambulatory Visit: Payer: Medicare Other | Attending: Physician Assistant | Admitting: Physician Assistant

## 2023-01-14 ENCOUNTER — Other Ambulatory Visit: Payer: Self-pay | Admitting: *Deleted

## 2023-01-14 ENCOUNTER — Encounter: Payer: Self-pay | Admitting: Physician Assistant

## 2023-01-14 ENCOUNTER — Telehealth (HOSPITAL_COMMUNITY): Payer: Self-pay | Admitting: *Deleted

## 2023-01-14 VITALS — BP 103/50 | HR 84 | Ht 67.0 in | Wt 203.2 lb

## 2023-01-14 DIAGNOSIS — I480 Paroxysmal atrial fibrillation: Secondary | ICD-10-CM | POA: Diagnosis not present

## 2023-01-14 DIAGNOSIS — R0602 Shortness of breath: Secondary | ICD-10-CM | POA: Diagnosis not present

## 2023-01-14 DIAGNOSIS — N186 End stage renal disease: Secondary | ICD-10-CM

## 2023-01-14 DIAGNOSIS — E1169 Type 2 diabetes mellitus with other specified complication: Secondary | ICD-10-CM | POA: Diagnosis present

## 2023-01-14 DIAGNOSIS — I2510 Atherosclerotic heart disease of native coronary artery without angina pectoris: Secondary | ICD-10-CM | POA: Insufficient documentation

## 2023-01-14 DIAGNOSIS — E785 Hyperlipidemia, unspecified: Secondary | ICD-10-CM | POA: Diagnosis present

## 2023-01-14 DIAGNOSIS — I10 Essential (primary) hypertension: Secondary | ICD-10-CM | POA: Insufficient documentation

## 2023-01-14 NOTE — Assessment & Plan Note (Signed)
LDL goal <55.  Continue Lipitor 80 mg daily.  Obtain fasting lipid panel tomorrow dialysis.

## 2023-01-14 NOTE — Patient Instructions (Signed)
Medication Instructions:  Your physician recommends that you continue on your current medications as directed. Please refer to the Current Medication list given to you today.  *If you need a refill on your cardiac medications before your next appointment, please call your pharmacy*   Lab Work: TOMORROW AT DIALYSIS, GO FASTING FOR:  LIPID & CBC  If you have labs (blood work) drawn today and your tests are completely normal, you will receive your results only by: MyChart Message (if you have MyChart) OR A paper copy in the mail If you have any lab test that is abnormal or we need to change your treatment, we will call you to review the results.   Testing/Procedures: A chest x-ray takes a picture of the organs and structures inside the chest, including the heart, lungs, and blood vessels. This test can show several things, including, whether the heart is enlarges; whether fluid is building up in the lungs; and whether pacemaker / defibrillator leads are still in place.  Go To [facility], TODAY: [address] [place]  [phone]   Your physician has requested that you have a lexiscan myoview. For further information please visit [email or web address] Please follow instruction sheet, BELOW:    You are scheduled for a Myocardial Perfusion Imaging Study Please arrive 15 minutes prior to your appointment time for registration and insurance purposes.  The test will take approximately 3 to 4 hours to complete; you may bring reading material.  If someone comes with you to your appointment, they will need to remain in the main lobby due to limited space in the testing area. **If you are pregnant or breastfeeding, please notify the nuclear lab prior to your appointment**  How to prepare for your Myocardial Perfusion Test: Do not eat or drink 3 hours prior to your test, except you may have water. Do not consume products containing caffeine (regular or decaffeinated) 12  hours prior to your test. (ex: coffee, chocolate, sodas, tea). Do bring a list of your current medications with you.  If not listed below, you may take your medications as normal. Do wear comfortable clothes (no dresses or overalls) and walking shoes, tennis shoes preferred (No heels or open toe shoes are allowed). Do NOT wear cologne, perfume, aftershave, or lotions (deodorant is allowed). If these instructions are not followed, your test will have to be rescheduled.    Follow-Up: At [facility], you and your health needs are our priority.  As part of our continuing mission to provide you with exceptional heart care, we have created designated Provider Care Teams.  These Care Teams include your primary Cardiologist (physician) and Advanced Practice Providers (APPs -  Physician Assistants and Nurse Practitioners) who all work together to provide you with the care you need, when you need it.  We recommend signing up for the patient portal called "MyChart".  Sign up information is provided on this After Visit Summary.  MyChart is used to connect with patients for Virtual Visits (Telemedicine).  Patients are able to view lab/test results, encounter notes, upcoming appointments, etc.  Non-urgent messages can be sent to your provider as well.   To learn more about what you can do with MyChart, go to [email or web address].    Your next appointment:   4-6 week(s)  Provider:   Tereso Newcomer, PA-C         Other Instructions Make sure you FINISH THE DIALYSIS   DASH Eating Plan DASH stands for Dietary Approaches to Stop Hypertension. The DASH  eating plan is a healthy eating plan that has been shown to: Lower high blood pressure (hypertension). Reduce your risk for type 2 diabetes, heart disease, and stroke. Help with weight loss. What are tips for following this plan? Reading food labels Check food labels for the amount of salt (sodium) per serving. Choose foods with less than 5 percent  of the Daily Value (DV) of sodium. In general, foods with less than 300 milligrams (mg) of sodium per serving fit into this eating plan. To find whole grains, look for the word "whole" as the first word in the ingredient list. Shopping Buy products labeled as "low-sodium" or "no salt added." Buy fresh foods. Avoid canned foods and pre-made or frozen meals. Cooking Try not to add salt when you cook. Use salt-free seasonings or herbs instead of table salt or sea salt. Check with your health care provider or pharmacist before using salt substitutes. Do not fry foods. Cook foods in healthy ways, such as baking, boiling, grilling, roasting, or broiling. Cook using oils that are good for your heart. These include olive, canola, avocado, soybean, and sunflower oil. Meal planning  Eat a balanced diet. This should include: 4 or more servings of fruits and 4 or more servings of vegetables each day. Try to fill half of your plate with fruits and vegetables. 6-8 servings of whole grains each day. 6 or less servings of lean meat, poultry, or fish each day. 1 oz is 1 serving. A 3 oz (85 g) serving of meat is about the same size as the palm of your hand. One egg is 1 oz (28 g). 2-3 servings of low-fat dairy each day. One serving is 1 cup (237 mL). 1 serving of nuts, seeds, or beans 5 times each week. 2-3 servings of heart-healthy fats. Healthy fats called omega-3 fatty acids are found in foods such as walnuts, flaxseeds, fortified milks, and eggs. These fats are also found in cold-water fish, such as sardines, salmon, and mackerel. Limit how much you eat of: Canned or prepackaged foods. Food that is high in trans fat, such as fried foods. Food that is high in saturated fat, such as fatty meat. Desserts and other sweets, sugary drinks, and other foods with added sugar. Full-fat dairy products. Do not salt foods before eating. Do not eat more than 4 egg yolks a week. Try to eat at least 2 vegetarian meals a  week. Eat more home-cooked food and less restaurant, buffet, and fast food. Lifestyle When eating at a restaurant, ask if your food can be made with less salt or no salt. If you drink alcohol: Limit how much you have to: 0-1 drink a day if you are male. 0-2 drinks a day if you are male. Know how much alcohol is in your drink. In the [place] one drink is one 12 oz bottle of beer (355 mL), one 5 oz glass of wine (148 mL), or one 1 oz glass of hard liquor (44 mL). General information Avoid eating more than 2,300 mg of salt a day. If you have hypertension, you may need to reduce your sodium intake to 1,500 mg a day. Work with your provider to stay at a healthy body weight or lose weight. Ask what the best weight range is for you. On most days of the week, get at least 30 minutes of exercise that causes your heart to beat faster. This may include walking, swimming, or biking. Work with your provider or dietitian to adjust your eating plan  to meet your specific calorie needs. What foods should I eat? Fruits All fresh, dried, or frozen fruit. Canned fruits that are in their natural juice and do not have sugar added to them. Vegetables Fresh or frozen vegetables that are raw, steamed, roasted, or grilled. Low-sodium or reduced-sodium tomato and vegetable juice. Low-sodium or reduced-sodium tomato sauce and tomato paste. Low-sodium or reduced-sodium canned vegetables. Grains Whole-grain or whole-wheat bread. Whole-grain or whole-wheat pasta. Brown rice. Orpah Cobb. Bulgur. Whole-grain and low-sodium cereals. Pita bread. Low-fat, low-sodium crackers. Whole-wheat flour tortillas. Meats and other proteins Skinless chicken or [place]. Ground chicken or [place]. Pork with fat trimmed off. Fish and seafood. Egg whites. Dried beans, peas, or lentils. Unsalted nuts, nut butters, and seeds. Unsalted canned beans. Lean cuts of beef with fat trimmed off. Low-sodium, lean precooked or cured meat, such as  sausages or meat loaves. Dairy Low-fat (1%) or fat-free (skim) milk. Reduced-fat, low-fat, or fat-free cheeses. Nonfat, low-sodium ricotta or cottage cheese. Low-fat or nonfat yogurt. Low-fat, low-sodium cheese. Fats and oils Soft margarine without trans fats. Vegetable oil. Reduced-fat, low-fat, or light mayonnaise and salad dressings (reduced-sodium). Canola, safflower, olive, avocado, soybean, and sunflower oils. Avocado. Seasonings and condiments Herbs. Spices. Seasoning mixes without salt. Other foods Unsalted popcorn and pretzels. Fat-free sweets. The items listed above may not be all the foods and drinks you can have. Talk to a dietitian to learn more. What foods should I avoid? Fruits Canned fruit in a light or heavy syrup. Fried fruit. Fruit in cream or butter sauce. Vegetables Creamed or fried vegetables. Vegetables in a cheese sauce. Regular canned vegetables that are not marked as low-sodium or reduced-sodium. Regular canned tomato sauce and paste that are not marked as low-sodium or reduced-sodium. Regular tomato and vegetable juices that are not marked as low-sodium or reduced-sodium. Rosita Fire. Olives. Grains Baked goods made with fat, such as croissants, muffins, or some breads. Dry pasta or rice meal packs. Meats and other proteins Fatty cuts of meat. Ribs. Fried meat. Tomasa Blase. Bologna, salami, and other precooked or cured meats, such as sausages or meat loaves, that are not lean and low in sodium. Fat from the back of a pig (fatback). Bratwurst. Salted nuts and seeds. Canned beans with added salt. Canned or smoked fish. Whole eggs or egg yolks. Chicken or [place] with skin. Dairy Whole or 2% milk, cream, and half-and-half. Whole or full-fat cream cheese. Whole-fat or sweetened yogurt. Full-fat cheese. Nondairy creamers. Whipped toppings. Processed cheese and cheese spreads. Fats and oils Butter. Stick margarine. Lard. Shortening. Ghee. Bacon fat. Tropical oils, such as coconut,  palm kernel, or palm oil. Seasonings and condiments Onion salt, garlic salt, seasoned salt, table salt, and sea salt. Worcestershire sauce. Tartar sauce. Barbecue sauce. Teriyaki sauce. Soy sauce, including reduced-sodium soy sauce. Steak sauce. Canned and packaged gravies. Fish sauce. Oyster sauce. Cocktail sauce. Store-bought horseradish. Ketchup. Mustard. Meat flavorings and tenderizers. Bouillon cubes. Hot sauces. Pre-made or packaged marinades. Pre-made or packaged taco seasonings. Relishes. Regular salad dressings. Other foods Salted popcorn and pretzels. The items listed above may not be all the foods and drinks you should avoid. Talk to a dietitian to learn more. Where to find more information National Heart, Lung, and Blood Institute (NHLBI): BuffaloDryCleaner.gl American Heart Association (AHA): heart.org Academy of Nutrition and Dietetics: eatright.org National Kidney Foundation (NKF): kidney.org This information is not intended to replace advice given to you by your health care provider. Make sure you discuss any questions you have with your health care provider. Document  Revised: 02/13/2022 Document Reviewed: 02/13/2022 Elsevier Patient Education  2024 [employer].

## 2023-01-14 NOTE — Assessment & Plan Note (Signed)
Maintaining sinus rhythm.  He seems to be tolerating anticoagulation.  Since he has been short of breath with exertion, I will obtain a CBC to rule out anemia.  As he remains on amiodarone, I will also obtain a chest x-ray.  Continue Eliquis 5 mg twice daily, metoprolol succinate 25 mg daily, amiodarone 200 mg daily.

## 2023-01-14 NOTE — Telephone Encounter (Signed)
Left message on voicemail per DPR in reference to upcoming appointment scheduled on 01/21/2023 at 12:45 with detailed instructions given per Myocardial Perfusion Study Information Sheet for the test. LM to arrive 15 minutes early, and that it is imperative to arrive on time for appointment to keep from having the test rescheduled. If you need to cancel or reschedule your appointment, please call the office within 24 hours of your appointment. Failure to do so may result in a cancellation of your appointment, and a $50 no show fee. Phone number given for call back for any questions.

## 2023-01-14 NOTE — Progress Notes (Signed)
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  [name] is a 62 y.o. male who returns for follow-up of CAD, A-fib.  He was last seen 10/2022. He is here alone. He notes exertional dyspnea over the past few months. The patient reports difficulty walking from the car to the clinic due to breathlessness, necessitating rest. He denies any chest discomfort or pain, orthopnea, or lower extremity swelling. The patient acknowledges suboptimal adherence to his dialysis regimen, often leaving before the recommended four-hour session is complete. He also admits to dietary indiscretions.      Review of Systems  Constitutional: Positive for weight gain.  Gastrointestinal:  Negative for hematochezia and melena.  See HPI     Studies Reviewed:   EKG Interpretation Date/Time:  Wednesday January 14 2023 13:52:17 EST Ventricular Rate:  81 PR Interval:  148 QRS Duration:  88 QT Interval:  388 QTC Calculation: 450 R Axis:   77  Text Interpretation: Normal sinus rhythm Possible Left atrial enlargement ST & T wave abnormality, consider inferior ischemia ST & T wave abnormality, consider anterolateral ischemia No significant change since last tracing Confirmed by [name] [phone]) on 01/14/2023 1:56:08 PM           Risk Assessment/Calculations:    CHA2DS2-VASc Score = 6   This indicates a 9.7% annual risk of stroke. The patient's score is based upon: CHF History: 1 HTN History: 1 Diabetes History: 1 Stroke History: 2 Vascular Disease History: 1 Age Score: 0 Gender Score: 0            Physical Exam:   VS:  BP (!) 103/50   Pulse 84   Ht 5\' 7"  (1.702 m)   Wt 203 lb 3.2 oz (92.2 kg)   SpO2 97%   BMI 31.83 kg/m    Wt Readings from Last 3 Encounters:  01/14/23 203 lb 3.2 oz (92.2 kg)  10/20/22 194 lb 14.4 oz (88.4 kg)  10/15/22 194 lb 12.8 oz (88.4 kg)    Constitutional:      Appearance: Healthy appearance. Not in distress.  Neck:     Vascular: JVD elevated.  Pulmonary:     Breath sounds: Normal breath sounds. No wheezing. No rales.  Cardiovascular:     Normal rate. Regular rhythm.     Murmurs: There is no  murmur.  Edema:    Peripheral edema absent.  Abdominal:     Palpations: Abdomen is soft.        Assessment and Plan:   Assessment & Plan SOB (shortness of breath) He has noted dyspnea with exertion over the past several weeks.  I suspect his shortness of breath is all related to volume excess.  He has been cutting dialysis sessions short.  He also admits to eating a diet rich in salt.  He is on amiodarone.  I do not hear any crackles on lung exam.  He has not had any chest pain.  His ECG does show persistent inf-lat and ant-lat TWI.  -Improve adherence to dialysis sessions -Low-salt diet -CBC to rule out anemia (draw dialysis tomorrow) -Chest  x-ray -Arrange Lexiscan Myoview to r/o graft failure  -Follow-up 4 to 6 weeks Coronary artery disease involving native coronary artery of native heart without angina pectoris History of non-STEMI in 2022 with multivessel disease on cardiac catheterization.  He ultimately underwent CABG in August 2023.  Post bypass course was complicated with 68-month hospitalization between August and October 2023 notable for cardiogenic shock, progression end-stage renal disease, pericardial effusion and pleural effusion.  He is not having chest discomfort to suggest angina.  He does note dyspnea with exertion which I suspect is all related to excess volume from inadequate dialysis sessions.  Continue Lipitor 80 mg daily.  As noted, his ECG shows diffuse TW inversions. I will get a follow up Lexiscan Myoview to r/o graft failure  Paroxysmal atrial fibrillation (HCC) Maintaining sinus rhythm.  He seems to be tolerating anticoagulation.  Since he has been short of breath with exertion, I will obtain a CBC to rule out anemia.  As he remains on amiodarone, I will also obtain a chest x-ray.  Continue Eliquis 5 mg twice daily, metoprolol succinate 25 mg daily, amiodarone 200 mg daily. ESRD (end stage renal disease) (HCC) Tuesday, Thursday, Saturday dialysis.  As noted, he has cut dialysis session sort in the past.  His weight is up 9 pounds since his last visit.  We discussed the importance of completing dialysis sessions. Hyperlipidemia associated with type 2 diabetes mellitus (HCC) LDL goal <55.  Continue Lipitor 80 mg daily.  Obtain fasting lipid panel tomorrow dialysis. Essential hypertension Blood pressure controlled.  Continue metoprolol succinate 25 mg daily.       Dispo:  Return in about 6 weeks (around 02/25/2023) for Routine Follow Up, w/ Mark Newcomer, PA-C.  Signed, Mark Newcomer, PA-C

## 2023-01-14 NOTE — Assessment & Plan Note (Signed)
History of non-STEMI in 2022 with multivessel disease on cardiac catheterization.  He ultimately underwent CABG in August 2023.  Post bypass course was complicated with 46-month hospitalization between August and October 2023 notable for cardiogenic shock, progression end-stage renal disease, pericardial effusion and pleural effusion.  He is not having chest discomfort to suggest angina.  He does note dyspnea with exertion which I suspect is all related to excess volume from inadequate dialysis sessions.  Continue Lipitor 80 mg daily.  As noted, his ECG shows diffuse TW inversions. I will get a follow up Lexiscan Myoview to r/o graft failure

## 2023-01-21 ENCOUNTER — Ambulatory Visit (HOSPITAL_COMMUNITY): Payer: Medicare Other

## 2023-01-23 ENCOUNTER — Telehealth: Payer: Self-pay | Admitting: *Deleted

## 2023-01-23 NOTE — Telephone Encounter (Signed)
-----   Message from Tereso Newcomer sent at 01/22/2023  9:27 AM EST ----- Fasting labs obtained through dialysis reviewed. 01/17/2023: Total cholesterol 133, triglycerides 148, HDL 37, LDL 66  IMPRESSION: LDL above goal.  Goal is <55.  PLAN: -Add Zetia 10 mg daily -Fasting lipids, ALT in 3 months  Tereso Newcomer, PA-C 01/22/2023 9:29 AM

## 2023-01-23 NOTE — Telephone Encounter (Signed)
Call placed to pt regarding message below.  Left a message for pt to call back.  

## 2023-01-26 ENCOUNTER — Encounter (HOSPITAL_COMMUNITY): Payer: Self-pay | Admitting: Internal Medicine

## 2023-02-12 NOTE — Telephone Encounter (Signed)
 2nd attempt to reach pt regarding message below.  Left a message for pt to call back.

## 2023-02-13 ENCOUNTER — Other Ambulatory Visit: Payer: Self-pay

## 2023-02-17 ENCOUNTER — Encounter: Payer: Self-pay | Admitting: *Deleted

## 2023-02-17 NOTE — Telephone Encounter (Signed)
Sent pt a my chart message regarding below.  

## 2023-02-18 NOTE — Progress Notes (Deleted)
I saw [name] in neurology clinic on 02/27/23 in follow up for stroke and right median and ulnar neuropathy.  HPI: [name] is a 63 y.o. year old male with a history of stroke, atrial flutter, HTN, HLD, CAD s/p MI, DM, ESRD on dialysis, former smoker who we last saw on 08/27/22.  To briefly review: 08/27/22: Patient had strokes in 2022 and 2023 per his report. Patient has not been able to use his right hand since the stroke in 2023. He has numbness and tingling in the hand since as well. He had numbness, tingling, and weakness in the left foot previously, but that improved. He has some dysarthria since the stroke as well. He had some speech therapy after the stroke, but only went one time. He also did some therapy for his hand, but insurance would not cover more. He mentions being in a coma for 2 months after an MI about 1 year ago.     He thinks his speech has worsened over the last couple of months, especially when trying to talk fast   Current medications: Eliquis 5 mg BID - he is supposed to be on but it is too expensive. He has not taken it for about 1 month. He also takes atorvastatin 80 mg daily.  Most recent Assessment and Plan (08/27/22): [name] is a 63 y.o. male who presents for history of stroke. He has a relevant medical history of stroke, atrial flutter, HTN, HLD, CAD s/p MI, DM, ESRD on dialysis, former smoker. His neurological examination is pertinent for dysarthria and right hand weakness. Available diagnostic data is significant for MRI brain in 12/2020 showing acute infarcts in multiple vascular territories, including bilateral cerebellum, bilateral parietal, and bilateral frontal lobes. CTA head and neck showed narrowing in multiple vascular territories as well. The etiology of patient's strokes is likely cardioembolic given the multifocal nature of the acute infarcts. He has atrial flutter, but has been off of eliquis for about 1 month to due cost of medication. I  explained the danger of this to patient today. He will be reaching out to cardiology about alternatives and taking asa 81 mg daily until he is able to start anticoagulation as he should be on long term for atrial flutter.   While his right hand weakness has previously been attributed to his stroke, I did not see clear infarct on MRI brain to explain this weakness. It may be stroke related, but I will get an EMG to ensure there is not a peripheral etiology such as cervical radiculopathy (C8-T1) vs median and ulnar neuropathy.   PLAN: -Patient is reaching out to cardiology about Eliquis. Given that he is on nothing and cannot afford eliquis currently, I recommend asa 81 mg daily until he can get eliquis. He should not take them together and knows to stop asa if he restarts anticoagulation. 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 I recommended NMUS to further localize. 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 Right median nerve showed an increased wrist to forearm ratio which can be seen in CTS with no evidence of pronator teres syndrome. After discussion, patient wanted to speak with surgery to see if intervention  might help his hand weakness. ***  Stroke symptoms?***  ROS: Pertinent positive and negative systems reviewed in HPI. ***   MEDICATIONS:  Outpatient Encounter Medications as of 02/27/2023  Medication Sig   amiodarone (PACERONE) 200 MG tablet Take 1 tablet (200 mg total) by mouth daily.   apixaban (ELIQUIS) 5 MG TABS tablet Take 1 tablet (5 mg total) by mouth 2 (two) times daily.   atorvastatin (LIPITOR) 80 MG tablet Take 1 tablet (80 mg total) by mouth daily.   metoprolol  succinate (TOPROL-XL) 25 MG 24 hr tablet Take 1 tablet (25 mg total) by mouth daily.   No facility-administered encounter medications on file as of 02/27/2023.    PAST MEDICAL HISTORY: Past Medical History:  Diagnosis Date   AKI (acute kidney injury) (HCC)    pt unaware of this   Anginal pain (HCC)    CAD (coronary artery disease)    a. 03/2015 NSTEMI: LHC with severe 3V CAD  (70% mid RCA, 95% OM1, 90% distal LCx, 90% OM3, 80% prox LAD and 90% ost D1) s/p DES to mLAD w/ small dissction Rx with DES, staged ost Ramus PCI/DES and dLCx s/p PCI/DES    Chest pain 12/24/2020   Diabetes mellitus type 2 in obese    Diverticulosis    Dyspnea    Dyspnea on exertion 03/16/2015   Dyspnea on exertion   Family history of adverse reaction to anesthesia    patient father- pt states after anesthesia his father "developed dementia"   GERD (gastroesophageal reflux disease)    Hypercholesteremia    Hypertension associated with diabetes (HCC) 03/16/2015   hypertension   NSTEMI (non-ST elevated myocardial infarction) (HCC) 03/17/2015   Obesity    Stroke (HCC) 2022   pt states he had a "mini stroke" during cardiac catheterization   Tobacco abuse    TTHS- [address]     PAST SURGICAL HISTORY: Past Surgical History:  Procedure Laterality Date   APPLICATION OF WOUND VAC N/A 10/03/2021   Procedure: APPLICATION OF WOUND VAC;  Surgeon: Lovett Sox, MD;  Location: [facility] OR;  Service: Thoracic;  Laterality: N/A;   AV FISTULA PLACEMENT Right 03/14/2022   Procedure: RIGHT ARM ARTERIOVENOUS (AV) FISTULA VERSUS ARTERIOVENOUS GRAFT CREATION;  Surgeon: Nada Libman, MD;  Location: [facility] OR;  Service: Vascular;  Laterality: Right;   BASCILIC VEIN TRANSPOSITION Right 09/26/2022   Procedure: RIGHT ARM SECOND STAGE BASILIC VEIN TRANSPOSITION;  Surgeon: Nada Libman, MD;  Location: [facility] OR;  Service: Vascular;  Laterality: Right;   CANNULATION FOR ECMO (EXTRACORPOREAL MEMBRANE OXYGENATION) N/A 09/29/2021   Procedure:  CANNULATION FOR ECMO (EXTRACORPOREAL MEMBRANE OXYGENATION);  Surgeon: Corliss Skains, MD;  Location: [facility] OR;  Service: Open Heart Surgery;  Laterality: N/A;   CARDIAC CATHETERIZATION N/A 03/19/2015   Procedure: Left Heart Cath and Coronary Angiography;  Surgeon: Runell Gess, MD;  Location: [facility] INVASIVE CV LAB;  Service: Cardiovascular;  Laterality: N/A;   CARDIAC CATHETERIZATION N/A 03/20/2015   Procedure: Coronary Stent Intervention;  Surgeon: Runell Gess, MD;  Location: [facility] INVASIVE CV LAB;  Service: Cardiovascular;  Laterality: N/A;   CARDIAC CATHETERIZATION N/A 03/22/2015   Procedure: Coronary Stent Intervention;  Surgeon: Runell Gess, MD;  Location: [facility] INVASIVE CV LAB;  Service: Cardiovascular;  Laterality: N/A;   CORONARY ARTERY BYPASS GRAFT N/A 09/12/2021   Procedure: CORONARY ARTERY BYPASS GRAFTING (CABG) X 5 USING LEFT INTERNAL MAMMARY ARTERY AND ENDOSCOPICALLY HARVESTED RIGHT GREATER SAPHENOUS VEIN.;  Surgeon: Alleen Borne, MD;  Location:  [facility] OR;  Service: Open Heart Surgery;  Laterality: N/A;   EXPLORATION POST OPERATIVE OPEN HEART N/A 09/30/2021   Procedure: EXPLORATION POST OPERATIVE OPEN HEART WASHOUT;  Surgeon: Corliss Skains, MD;  Location: [facility] OR;  Service: Open Heart Surgery;  Laterality: N/A;   IR FLUORO GUIDE CV LINE LEFT  11/04/2021   IR FLUORO GUIDE CV LINE LEFT  12/06/2021   IR FLUORO GUIDE CV LINE LEFT  04/11/2022   IR GASTROSTOMY TUBE MOD SED  11/15/2021   IR GASTROSTOMY TUBE MOD SED  11/20/2021   IR GASTROSTOMY TUBE REMOVAL  01/20/2022   IR US GUIDE VASC ACCESS LEFT  11/04/2021   IR VENOCAVAGRAM SVC  12/06/2021   IR VENOCAVAGRAM SVC  04/11/2022   LEFT HEART CATH AND CORONARY ANGIOGRAPHY N/A 12/25/2020   Procedure: LEFT HEART CATH AND CORONARY ANGIOGRAPHY;  Surgeon: [name], MD;  Location: [facility] INVASIVE CV LAB;  Service: Cardiovascular;  Laterality: N/A;   LEFT HEART CATH AND CORONARY ANGIOGRAPHY N/A 12/26/2020   Procedure: LEFT HEART CATH AND  CORONARY ANGIOGRAPHY;  Surgeon: [name], MD;  Location: [facility] INVASIVE CV LAB;  Service: Cardiovascular;  Laterality: N/A;   MEDIASTINAL EXPLORATION N/A 10/03/2021   Procedure: MEDIASTINAL WASHOUT;  Surgeon: Lovett Sox, MD;  Location: [facility] OR;  Service: Thoracic;  Laterality: N/A;  PUMP STANDBY   MEDIASTINAL EXPLORATION N/A 10/12/2021   Procedure: MEDIASTINAL WASHOUT;  Surgeon: Alleen Borne, MD;  Location: [facility] OR;  Service: Thoracic;  Laterality: N/A;   PERCUTANEOUS TRACHEOSTOMY N/A 10/17/2021   Procedure: PERCUTANEOUS TRACHEOSTOMY USING SHILEY FLEXIBLE 8 mm CUFFED TRACH.;  Surgeon: Lorin Glass, MD;  Location: [facility] OR;  Service: Pulmonary;  Laterality: N/A;  Percutaneous tracheostomy   PERICARDIAL WINDOW Right 09/28/2021   Procedure: PERICARDIAL WINDOW;  Surgeon: Corliss Skains, MD;  Location: [facility] OR;  Service: Thoracic;  Laterality: Right;  Right VATS.  Lazy lateral.  double lumen ET tube   PLACEMENT OF IMPELLA LEFT VENTRICULAR ASSIST DEVICE N/A 10/07/2021   Procedure: INSERTION OF RIGHT AXILLARY IMPELLA, DECANNULATION OF ECMO, AND MEDIASTINAL WASHOUT;  Surgeon: Alleen Borne, MD;  Location: [facility] OR;  Service: Open Heart Surgery;  Laterality: N/A;  Open right axillary with 8 mm Hemashield Platinum Vascular graft.   RADIOLOGY WITH ANESTHESIA N/A 11/20/2021   Procedure: G-Tube Placement;  Surgeon: Simonne Come, MD;  Location: [facility] OR;  Service: Radiology;  Laterality: N/A;   REMOVAL OF IMPELLA LEFT VENTRICULAR ASSIST DEVICE Right 10/17/2021   Procedure: REMOVAL OF IMPELLA LEFT VENTRICULAR ASSIST DEVICE;  Surgeon: Alleen Borne, MD;  Location: [facility] OR;  Service: Open Heart Surgery;  Laterality: Right;   STERNAL CLOSURE N/A 10/12/2021   Procedure: STERNAL CLOSURE;  Surgeon: Alleen Borne, MD;  Location: [facility] OR;  Service: Thoracic;  Laterality: N/A;   TEE WITHOUT CARDIOVERSION N/A 09/12/2021   Procedure: TRANSESOPHAGEAL ECHOCARDIOGRAM (TEE);  Surgeon: Alleen Borne, MD;  Location: [facility] OR;  Service:  Open Heart Surgery;  Laterality: N/A;   TEE WITHOUT CARDIOVERSION N/A 09/29/2021   Procedure: TRANSESOPHAGEAL ECHOCARDIOGRAM (TEE);  Surgeon: Corliss Skains, MD;  Location: [facility] OR;  Service: Open Heart Surgery;  Laterality: N/A;   TEE WITHOUT CARDIOVERSION N/A 10/03/2021   Procedure: TRANSESOPHAGEAL ECHOCARDIOGRAM (TEE);  Surgeon: Lovett Sox, MD;  Location: [facility] OR;  Service: Thoracic;  Laterality: N/A;   TEE WITHOUT CARDIOVERSION  10/07/2021   Procedure: TRANSESOPHAGEAL ECHOCARDIOGRAM (TEE);  Surgeon: Alleen Borne, MD;  Location: [facility] OR;  Service: Open Heart Surgery;;  TEE WITHOUT CARDIOVERSION N/A 10/12/2021   Procedure: TRANSESOPHAGEAL ECHOCARDIOGRAM (TEE);  Surgeon: Alleen Borne, MD;  Location[facility] OR;  Service: Thoracic;  Laterality: N/A;   TEE WITHOUT CARDIOVERSION N/A 10/17/2021   Procedure: TRANSESOPHAGEAL ECHOCARDIOGRAM (TEE);  Surgeon: Alleen Borne, MD;  Location: [facility] OR;  Service: Open Heart Surgery;  Laterality: N/A;   TESTICLE SURGERY  1970   pt states testicle was ascended and had to be "pulled down"   TONSILLECTOMY     as a child    ALLERGIES: No Known Allergies  FAMILY HISTORY: No family history on file.  SOCIAL HISTORY: Social History   Tobacco Use   Smoking status: Former    Current packs/day: 0.00    Types: Cigarettes    Quit date: 2017    Years since quitting: 8.0   Smokeless tobacco: Never  Vaping Use   Vaping status: Former   Start date: 10/14/2017   Quit date: 09/09/2021  Substance Use Topics   Alcohol use: Not Currently    Comment: very occasional, maybe a beer or mixed drink once every few months   Drug use: Not Currently   Social History   Social History Narrative   Right handed   Unemployed   Lives with uncle   No caffeine   Two story home    Objective:  Vital Signs:  There were no vitals taken for this visit.  General:*** General appearance: Awake and alert. No distress. Cooperative with exam.  Skin: No obvious rash or  jaundice. HEENT: Atraumatic. Anicteric. Lungs: Non-labored breathing on room air  Heart: Regular Abdomen: Soft, non tender. Extremities: No edema. No obvious deformity.  Musculoskeletal: No obvious joint swelling.  Neurological: Mental Status: Alert. Speech fluent. No pseudobulbar affect Cranial Nerves: CNII: No RAPD. Visual fields intact. CNIII, IV, VI: PERRL. No nystagmus. EOMI. CN V: Facial sensation intact bilaterally to fine touch. [place] clench strong. Jaw jerk***. CN VII: Facial muscles symmetric and strong. No ptosis at rest or after sustained upgaze***. CN VIII: Hears finger rub well bilaterally. CN IX: No hypophonia. CN X: Palate elevates symmetrically. CN XI: Full strength shoulder shrug bilaterally. CN XII: Tongue protrusion full and midline. No atrophy or fasciculations. No significant dysarthria*** Motor: Tone is ***. *** fasciculations in *** extremities. *** atrophy. No grip or percussive myotonia.  Individual muscle group testing (MRC grade out of 5):  Movement     Neck flexion ***    Neck extension ***     Right Left   Shoulder abduction *** ***   Shoulder adduction *** ***   Shoulder ext rotation *** ***   Shoulder int rotation *** ***   Elbow flexion *** ***   Elbow extension *** ***   Wrist extension *** ***   Wrist flexion *** ***   Finger abduction - FDI *** ***   Finger abduction - ADM *** ***   Finger extension *** ***   Finger distal flexion - 2/3 *** ***   Finger distal flexion - 4/5 *** ***   Thumb flexion - FPL *** ***   Thumb abduction - APB *** ***    Hip flexion *** ***   Hip extension *** ***   Hip adduction *** ***   Hip abduction *** ***   Knee extension *** ***   Knee flexion *** ***   Dorsiflexion *** ***   Plantarflexion *** ***   Inversion *** ***   Eversion *** ***   Great toe extension *** ***   Great  toe flexion *** ***     Reflexes:  Right Left  Bicep *** ***  Tricep *** ***  BrRad *** ***  Knee *** ***   Ankle *** ***   Pathological Reflexes: Babinski: *** response bilaterally*** Hoffman: *** Troemner: *** Pectoral: *** Palmomental: *** Facial: *** Midline tap: *** Sensation: Pinprick: *** Vibration: *** Temperature: *** Proprioception: *** Coordination: Intact finger-to- nose-finger and heel-to-shin bilaterally. Romberg negative.*** Gait: Able to rise from chair with arms crossed unassisted. Normal, narrow-based gait. Able to tandem walk. Able to walk on toes and heels.***   Lab and Test Review: New results: 10/11/22: CMP significant for glucose of 141, Cr 5.33, alk phos 149 CBC significant for WBC 19.2 (neutrophilic predominance)  EMG (09/15/22): NCV & EMG Findings: Extensive electrodiagnostic evaluation of the right upper limb with additional nerve conduction studies of the left upper limb shows: Right ulnar sensory response is absent. Right median sensory response is within normal limits, but 50% of response of the left median sensory response. Right radial sensory response is within normal limits. Right ulnar (ADM) motor response shows reduced amplitude (0.62 mV) with additional conduction velocity slowing from above elbow to below elbow stimulation sites (34 m/s). Right median (APB) motor response shows reduced amplitude (1.73 mV) with evidence of Thressa Sheller Anastomosis (median to ulnar crossover in the forearm). Chronic motor axon loss changes WITH active denervation changes are seen in the right first dorsal interosseous, adductor digiti minimi, and flexor pollicis longus muscles. Chronic motor axon loss changes WITHOUT active denervation changes are seen in the right abductor pollicis brevis and flexor digitorum profundus to digits 4,5 muscles.   Impression: This is an abnormal study. The findings are most consistent with the following: Evidence of a right ulnar mononeuropathy proximal to the takeoff to the flexor digitorum profundus to digits 4, 5 muscle, likely at the  elbow given the conduction velocity slowing across the elbow. Findings are severe in degree electrically. Evidence of a right median mononeuropathy proximal to the flexor pollicis longus and distal to the pronator teres, perhaps consistent with pronator teres syndrome. Findings are moderate to severe in degree electrically. No electrodiagnostic evidence of a right cervical (C5-T1) motor radiculopathy.  NMUS (10/20/22): Findings: High frequency (4.0-16.0 MHz) B-mode, nonvascular ultrasound of the right upper limb shows: Cross sectional areas (CSA) of bilateral median (palm to mid upper arm) is within normal limits. The right median nerve is lost at the area of fistula in right upper arm and CSA cannot be measured.  Wrist to forearm ratios of the right median nerve is increased (1.74). CSA of right ulnar (wrist to mid upper arm) nerve is increased at the elbow (13.3 mm2). There is subluxation but not dislocation of right ulnar nerve from the ulnar groove with maximal elbow flexion.   Impression: This is an abnormal study. The findings are most consistent with the following: Evidence of possible entrapment of the right median nerve at the wrist segment, based on an increased ratio comparing the cross sectional area (CSA) measurement of the median nerve at the wrist to the forearm. Although this finding may be seen in carpal tunnel syndrome, the more established criteria for ultrasonographic diagnosis (i.e., CSA> 13 mm2) is not present. No evidence of compression of the right median nerve at the level of the pronator teres (pronator teres syndrome). The right median nerve cannot be visualized at the level of mid-arm due to fistula and scarring. Focal enlargement of the right median nerve in this region cannot be  excluded. Ultrasonographic evidence of entrapment or repetitive trauma of the right ulnar nerve at the elbow segment, based on swelling of the nerve at the ulnar groove with the joint held in 45  degrees of flexion. The nerve subluxes to the tip of the medial epicondyle but does not dislocate from the ulnar groove with maximal elbow flexion. No other obvious lesion involving the adjacent bone or tendon is identified. No definite vascular abnormalities.  Previously reviewed results: Lab Results  Component Value Date    HGBA1C 6.0 (A) 06/06/2022      Recent Labs       Lab Results  Component Value Date    VITAMINB12 2,063 (H) 10/23/2021      Recent Labs       Lab Results  Component Value Date    TSH 0.460 03/12/2021      Recent Labs[] Expand by Default       Lab Results  Component Value Date    ESRSEDRATE 50 (H) 09/29/2021      Lipid panel (03/12/21):   Component     Latest Ref Rng 03/12/2021  Cholesterol     0 - 200 mg/dL 409 (H)   Triglycerides     <150 mg/dL 811 (H)   HDL Cholesterol     >40 mg/dL 31 (L)   Total CHOL/HDL Ratio     RATIO 8.1   VLDL     0 - 40 mg/dL UNABLE TO CALCULATE IF TRIGLYCERIDE OVER 400 mg/dL   LDL (calc)     0 - 99 mg/dL UNABLE TO CALCULATE IF TRIGLYCERIDE OVER 400 mg/dL       Imaging: MRI brain wo contrast (12/28/2020): I personally reviewed and agree with the below findings of acute infarcts in multiple vascular territories including bilateral cerebellum, bilateral parietal and frontal lobes. Radiology read: FINDINGS: Brain: There is a large acute or early subacute infarct of the superior right cerebellum. Additionally, there are multiple scattered punctate acute infarcts scattered throughout both hemispheres in the anterior and posterior circulation. There is petechial hemorrhage in the area of the right cerebellar infarct with mild surrounding edema . there is an old left cerebellar infarct. No chronic microhemorrhage. Normal midline structures.   Vascular: Major flow voids are preserved.   Skull and upper cervical spine: Normal calvarium and skull base. Visualized upper cervical spine and soft tissues are normal.    Sinuses/Orbits:No paranasal sinus fluid levels or advanced mucosal thickening. No mastoid or middle ear effusion. Normal orbits.   IMPRESSION: 1. Large acute infarct of the superior right cerebellum with mild surrounding edema and petechial hemorrhage. 2. Multiple scattered punctate acute infarcts in multiple vascular territories, consistent with a central cardiac or aortic embolic source. 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   CTA head and neck (12/28/20): FINDINGS: CT HEAD FINDINGS   Brain: Redemonstrated hypodensity in the right cerebellar hemisphere, consistent with the infarct seen on MRI, with new areas of increased density, likely petechial hemorrhage, also consistent with the MRI. No new areas of hypodensity; no CT correlate is seen for other punctate infarcts seen on the MRI. No mass, mass effect, or midline shift   Vascular: No hyperdense vessel.   Skull: Normal. Negative for fracture or focal lesion.   Sinuses: Mucous retention cysts in the left maxillary sinus and left ethmoid air cells. The mastoids are well aerated.   Orbits: No acute finding.   Review of the MIP images confirms the above findings   CTA NECK FINDINGS   Aortic arch: 3  vessel arch with a common origin of the brachiocephalic and left common carotid arteries, as well as an aortic origin the left vertebral artery. Imaged portion shows no evidence of aneurysm or dissection. Moderate stenosis of the left vertebral artery origin, secondary to calcified and noncalcified plaque (series 12, image 327).   Right carotid system: No evidence of dissection, stenosis (50% or greater) or occlusion. Calcified and noncalcified plaque at the bifurcation, with less than 50% stenosis.   Left carotid system: No evidence of dissection, stenosis (50% or greater) or occlusion. Calcified and noncalcified plaque at the bifurcation, with less than 50% stenosis.   Vertebral arteries: Moderate stenosis at the origin of  the left vertebral artery. The left vertebral artery is otherwise patent through the V3 segment. Mild narrowing at the origin of the right vertebral artery, which is otherwise patent through the V3 segment.   Skeleton: Degenerative changes in the cervical spine. No acute osseous abnormality.   Other neck: Significant enlargement of the right thyroid lobe, with multiple hypoattenuating nodules.   Upper chest: Small bilateral pleural effusions with associated atelectasis. Multiple prominent mediastinal lymph nodes (series 12, image 362).   Review of the MIP images confirms the above findings   CTA HEAD FINDINGS   Anterior circulation: Multifocal calcified and noncalcified narrowing of the right intracranial ICA, which is severe in the cavernous (series 12, image 113), and ophthalmic/supraclinoid segments (series 12, image 108). Severe narrowing in the distal left cavernous ICA (series 12, images 109-111) and supraclinoid segments (series 12, image 102.   Mild to moderate narrowing of the distal right A1. Normal left A1. Normal anterior communicating artery. There is mild irregularity of the distal ACAs, but they remain patent.   Mild irregularity in the M1 segments, which are patent. Normal bifurcations. Distal MCA segments are mildly irregular but perfused.   Posterior circulation: Severe narrowing in the left V4, with focal nonopacification (series 12, image 145), with distal reconstitution proximal to the vertebrobasilar junction, possibly retrograde flow. Moderate focal narrowing in the right V4 segment (series 12, images 150-155).   Severe narrowing of the basilar artery (series 12, image 122), without complete occlusion. Likely severe focal narrowing at the origin of the right superior cerebellar artery. The origin of the left superior cerebellar artery appears patent.   The right posterior communicating artery is patent, with moderate focal stenosis near the origin  (series 12, image 106). Diminutive right P1. The left posterior is patent. Moderate stenosis at the origin of the left P1. Diminutive and somewhat irregular P2 and P3 segments bilaterally.   Venous sinuses: As permitted by contrast timing, patent.   Anatomic variants: None significant   Review of the MIP images confirms the above findings   IMPRESSION: 1. Redemonstrated hypodensity in the right cerebellar hemisphere, consistent with the infarct seen on MRI, with petechial hemorrhage. No new infarct is seen. 2. 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 3. Focal narrowing at the origin of the right superior cerebellar artery. 4. Severe narrowing in the left V4 segment, with focal occlusion and distal reconstitution, likely retrograde. Moderate focal narrowing is also seen in the right V4 segment. 5. Multifocal narrowing of the bilateral intracranial internal carotid arteries, which is severe in the cavernous, ophthalmic, and supraclinoid segments, as described above. 6. Moderate stenosis at the origin of the left vertebral artery, which originates from the aorta. Mild narrowing at the origin of the right vertebral artery. No other hemodynamically significant stenosis in the neck. 7. Small  bilateral pleural effusions and prominent mediastinal lymph nodes. Consider CT of the chest for further evaluation. 8. Enlarged right thyroid lobe with multiple hypoattenuating nodules. This has not previously been evaluated, an ultrasound of the thyroid is recommended.   Echo (05/16/22):  1. Left ventricular ejection fraction, by estimation, is 60 to 65%. The  left ventricle has normal function. The left ventricle has no regional  wall motion abnormalities. There is mild concentric left ventricular  hypertrophy. Left ventricular diastolic  parameters were normal.   2. Right ventricular systolic function is normal. The right ventricular  size is normal. Tricuspid  regurgitation signal is inadequate for assessing  PA pressure.   3. The mitral valve is degenerative. No evidence of mitral valve  regurgitation. No evidence of mitral stenosis.   4. The aortic valve has an indeterminant number of cusps. There is  moderate calcification of the aortic valve. There is moderate thickening  of the aortic valve. Aortic valve regurgitation is trivial. Aortic valve  sclerosis/calcification is present,  without any evidence of aortic stenosis.   5. The inferior vena cava was not well visualized.  LA is normal in size.  ASSESSMENT: This is [name], a 63 y.o. male with:  ***  Plan: ***  Return to clinic in ***  Total time spent reviewing records, interview, history/exam, documentation, and coordination of care on day of encounter:  *** min  Jacquelyne Balint, MD

## 2023-02-23 ENCOUNTER — Ambulatory Visit: Payer: Medicare Other | Attending: Physician Assistant | Admitting: Physician Assistant

## 2023-02-23 NOTE — Progress Notes (Deleted)
 Cardiology Office Note:    Date:  02/23/2023  ID:  [name], DOB 28-Mar-1960, MRN [id number] PCP: Mark Bleacher, MD  [facility] Providers Cardiologist:  Mark MARLA Red, MD { Click to update primary MD,subspecialty MD or APP then REFRESH:1}    {Click to Open Review  :1}   Patient Profile:     *** Coronary artery disease  NSTEMI 12/2020 - Cath w multivessel CAD >> CABG recommended but delayed for several weeks after CVA S/p CABG 09/2021 (L-LAD, S-Dx, S-OM1/OM2, S-dRCA; Dr. 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  [name] is a 63 y.o. male who returns for follow-up of CAD, A-fib, CHF.  He was last seen 01/14/2023.  He noted dyspnea with exertion.  His dialysis sessions have been cut short recently.  I arranged a nuclear stress test as well as a chest x-ray.  Neither of these were done.      ROS-See HPI ***    Studies Reviewed:       *** Results          Risk Assessment/Calculations:   {Does this patient have ATRIAL FIBRILLATION?:[phone]} No BP recorded.  {Refresh Note OR Click here to enter BP  :1}***       Physical Exam:   VS:  There were no vitals taken for this visit.   Wt Readings from Last 3 Encounters:  01/14/23 203  lb 3.2 oz (92.2 kg)  10/20/22 194 lb 14.4 oz (88.4 kg)  10/15/22 194 lb 12.8 oz (88.4 kg)    Physical Exam***     Assessment and Plan:   Assessment & Plan Coronary artery disease involving native coronary artery of native heart without angina pectoris  Paroxysmal atrial fibrillation (HCC)  ESRD on dialysis [facility])  Essential hypertension  Hyperlipidemia associated with type 2 diabetes mellitus (HCC)   Assessment and Plan             {  SOB (shortness of breath) He has noted dyspnea with exertion over the past several weeks.  I suspect his shortness of breath is all related to volume excess.  He has been cutting dialysis sessions short.  He also admits to eating a diet rich in salt.  He is on amiodarone .  I do not hear any crackles on lung exam.  He has not had any chest pain.  His ECG does show persistent inf-lat and ant-lat TWI.  -Improve adherence to dialysis sessions -Low-salt diet -CBC to rule out anemia (draw dialysis tomorrow) -Chest x-ray -Arrange Lexiscan Myoview to r/o graft failure  -Follow-up 4 to 6 weeks Coronary artery disease involving native coronary artery of native heart without angina pectoris History of non-STEMI in 2022 with multivessel disease on  cardiac catheterization.  He ultimately underwent CABG in August 2023.  Post bypass course was complicated with 35-month hospitalization between August and October 2023 notable for cardiogenic shock, progression end-stage renal disease, pericardial effusion and pleural effusion.  He is not having chest discomfort to suggest angina.  He does note dyspnea with exertion which I suspect is all related to excess volume from inadequate dialysis sessions.  Continue Lipitor  80 mg daily.  As noted, his ECG shows diffuse TW inversions. I will get a follow up Lexiscan Myoview to r/o graft failure  Paroxysmal atrial fibrillation (HCC) Maintaining sinus rhythm.  He seems to be tolerating anticoagulation.  Since he has been short of  breath with exertion, I will obtain a CBC to rule out anemia.  As he remains on amiodarone , I will also obtain a chest x-ray.  Continue Eliquis  5 mg twice daily, metoprolol  succinate 25 mg daily, amiodarone  200 mg daily. ESRD (end stage renal disease) (HCC) Tuesday, Thursday, Saturday dialysis.  As noted, he has cut dialysis session sort in the past.  His weight is up 9 pounds since his last visit.  We discussed the importance of completing dialysis sessions. Hyperlipidemia associated with type 2 diabetes mellitus (HCC) LDL goal <55.  Continue Lipitor  80 mg daily.  Obtain fasting lipid panel tomorrow dialysis. Essential hypertension Blood pressure controlled.  Continue metoprolol  succinate 25 mg daily.    :1}    {Are you ordering a CV Procedure (e.g. stress test, cath, DCCV, TEE, etc)?   Press F2        :[id number]}  Dispo:  No follow-ups on file.  Signed, Mark Ferrier, PA-C

## 2023-02-27 ENCOUNTER — Ambulatory Visit: Payer: Medicare Other | Admitting: Neurology

## 2023-02-27 ENCOUNTER — Encounter: Payer: Self-pay | Admitting: Neurology

## 2023-03-02 ENCOUNTER — Other Ambulatory Visit: Payer: Self-pay

## 2023-03-02 MED ORDER — LOTEPREDNOL ETABONATE 0.5 % OP SUSP
1.0000 [drp] | Freq: Every day | OPHTHALMIC | 3 refills | Status: DC
  Filled 2023-03-02 (×2): qty 5, 50d supply, fill #0

## 2023-03-02 MED ORDER — CYCLOSPORINE 0.05 % OP EMUL
1.0000 [drp] | Freq: Two times a day (BID) | OPHTHALMIC | 6 refills | Status: DC
  Filled 2023-03-02 – 2023-03-20 (×2): qty 60, 30d supply, fill #0

## 2023-03-03 ENCOUNTER — Other Ambulatory Visit: Payer: Self-pay

## 2023-03-06 ENCOUNTER — Other Ambulatory Visit: Payer: Self-pay

## 2023-03-11 ENCOUNTER — Other Ambulatory Visit: Payer: Self-pay

## 2023-03-20 ENCOUNTER — Telehealth: Payer: Self-pay | Admitting: *Deleted

## 2023-03-20 ENCOUNTER — Other Ambulatory Visit: Payer: Self-pay

## 2023-03-20 NOTE — Telephone Encounter (Signed)
 Call place to patient regarding his application assistance for eye drops.  Patient will need to get Ritesh Poudyal to complete the paperwork. Patient will also need to provide proof of insurance,date application,and provide insurance according to My [employer].

## 2023-03-23 ENCOUNTER — Ambulatory Visit: Payer: Medicare Other | Admitting: Podiatry

## 2023-03-23 ENCOUNTER — Encounter: Payer: Self-pay | Admitting: Podiatry

## 2023-03-23 DIAGNOSIS — B351 Tinea unguium: Secondary | ICD-10-CM | POA: Diagnosis not present

## 2023-03-23 DIAGNOSIS — M79675 Pain in left toe(s): Secondary | ICD-10-CM

## 2023-03-23 DIAGNOSIS — M79674 Pain in right toe(s): Secondary | ICD-10-CM

## 2023-03-25 NOTE — Progress Notes (Signed)
Subjective:   Patient ID: [name], male   DOB: 63 y.o.   MRN: [id number]   HPI Patient presents with severe elongation of nailbeds 1-5 both feet that are thick dystrophic and impossible for him to cut.  States that he has a lot of pain with this and he cannot reach his toenails.  Patient does not smoke currently and is moderately active   Review of Systems  All other systems reviewed and are negative.       Objective:  Physical Exam Vitals and nursing note reviewed.  Constitutional:      Appearance: He is well-developed.  Pulmonary:     Effort: Pulmonary effort is normal.  Musculoskeletal:        General: Normal range of motion.  Skin:    General: Skin is warm.  Neurological:     Mental Status: He is alert.     Neurovascular status found to be intact muscle strength was found to be moderately reduced range of motion subtalar midtarsal joint reduced.  Thick yellow brittle nailbeds 1-5 both feet that are all elongated and incurvated     Assessment:  Chronic mycotic nail infection with pain 1-5 both feet that he is not able to take care of     Plan:  H&P reviewed condition and sterile debridement of nailbeds 1-5 both feet no iatrogenic bleeding reappoint routine care as needed

## 2023-04-01 ENCOUNTER — Encounter: Payer: Medicare Other | Admitting: Family Medicine

## 2023-04-29 ENCOUNTER — Ambulatory Visit (INDEPENDENT_AMBULATORY_CARE_PROVIDER_SITE_OTHER): Payer: Medicare Other | Admitting: Family Medicine

## 2023-04-29 ENCOUNTER — Encounter: Payer: Self-pay | Admitting: Family Medicine

## 2023-04-29 VITALS — BP 122/80 | HR 77 | Temp 97.9°F | Resp 18 | Ht 67.0 in | Wt 218.4 lb

## 2023-04-29 DIAGNOSIS — E1122 Type 2 diabetes mellitus with diabetic chronic kidney disease: Secondary | ICD-10-CM

## 2023-04-29 DIAGNOSIS — Z794 Long term (current) use of insulin: Secondary | ICD-10-CM

## 2023-04-29 DIAGNOSIS — Z130 Encounter for screening for diseases of the blood and blood-forming organs and certain disorders involving the immune mechanism: Secondary | ICD-10-CM

## 2023-04-29 DIAGNOSIS — Z1211 Encounter for screening for malignant neoplasm of colon: Secondary | ICD-10-CM

## 2023-04-29 DIAGNOSIS — N186 End stage renal disease: Secondary | ICD-10-CM | POA: Diagnosis not present

## 2023-04-29 DIAGNOSIS — Z13220 Encounter for screening for lipoid disorders: Secondary | ICD-10-CM | POA: Diagnosis not present

## 2023-04-29 DIAGNOSIS — Z0000 Encounter for general adult medical examination without abnormal findings: Secondary | ICD-10-CM

## 2023-04-29 DIAGNOSIS — Z992 Dependence on renal dialysis: Secondary | ICD-10-CM | POA: Diagnosis not present

## 2023-04-29 LAB — POCT GLYCOSYLATED HEMOGLOBIN (HGB A1C): Hemoglobin A1C: 6.2 % — AB (ref 4.0–5.6)

## 2023-04-29 NOTE — Progress Notes (Signed)
 Established Patient Office Visit  Subjective    Patient ID: [name], male    DOB: [birth year]  Age: 63 y.o. MRN: [id number]  CC:  Chief Complaint  Patient presents with   referral /for colonoscopy   Annual Exam    HPI [name] presents for routine annual exam. Patient denies acute complaints.   Outpatient Encounter Medications as of 04/29/2023  Medication Sig   amiodarone (PACERONE) 200 MG tablet Take 1 tablet (200 mg total) by mouth daily.   apixaban (ELIQUIS) 5 MG TABS tablet Take 1 tablet (5 mg total) by mouth 2 (two) times daily.   atorvastatin (LIPITOR) 80 MG tablet Take 1 tablet (80 mg total) by mouth daily.   cycloSPORINE (RESTASIS) 0.05 % ophthalmic emulsion Place 1 drop into both eyes 2 (two) times daily.   loteprednol (LOTEMAX) 0.5 % ophthalmic suspension Place 1 drop into both eyes daily.   metoprolol succinate (TOPROL-XL) 25 MG 24 hr tablet Take 1 tablet (25 mg total) by mouth daily.   No facility-administered encounter medications on file as of 04/29/2023.    Past Medical History:  Diagnosis Date   AKI (acute kidney injury) [facility])    pt unaware of this   Anginal pain (HCC)    CAD (coronary artery disease)    a. 03/2015 NSTEMI: LHC with severe 3V CAD  (70% mid RCA, 95% OM1, 90% distal LCx, 90% OM3, 80% prox LAD and 90% ost D1) s/p DES to mLAD w/ small dissction Rx with DES, staged ost Ramus PCI/DES and dLCx s/p PCI/DES    Chest pain 12/24/2020   Diabetes mellitus type 2 in obese    Diverticulosis    Dyspnea    Dyspnea on exertion 03/16/2015   Dyspnea on exertion   Family history of adverse reaction to anesthesia    patient father- pt states after anesthesia his father "developed dementia"   GERD (gastroesophageal reflux disease)    Hypercholesteremia    Hypertension associated with diabetes (HCC) 03/16/2015   hypertension   NSTEMI (non-ST elevated myocardial infarction) (HCC) 03/17/2015   Obesity    Stroke (HCC) 2022   pt states he had a "mini  stroke" during cardiac catheterization   Tobacco abuse    TTHS- [address]     Past Surgical History:  Procedure Laterality Date   APPLICATION OF WOUND VAC N/A 10/03/2021   Procedure: APPLICATION OF WOUND VAC;  Surgeon: Lovett Sox, MD;  Location: [facility] OR;  Service: Thoracic;  Laterality: N/A;   AV FISTULA PLACEMENT Right 03/14/2022   Procedure: RIGHT ARM ARTERIOVENOUS (AV) FISTULA VERSUS ARTERIOVENOUS GRAFT CREATION;  Surgeon: Nada Libman, MD;  Location: [facility] OR;  Service: Vascular;  Laterality: Right;   BASCILIC VEIN TRANSPOSITION Right 09/26/2022   Procedure: RIGHT ARM SECOND STAGE BASILIC VEIN TRANSPOSITION;  Surgeon: Nada Libman, MD;  Location: [facility] OR;  Service: Vascular;  Laterality: Right;   CANNULATION FOR ECMO (EXTRACORPOREAL MEMBRANE OXYGENATION) N/A 09/29/2021   Procedure: CANNULATION FOR ECMO (EXTRACORPOREAL MEMBRANE OXYGENATION);  Surgeon: Corliss Skains, MD;  Location: [facility] OR;  Service: Open Heart Surgery;  Laterality: N/A;   CARDIAC CATHETERIZATION N/A 03/19/2015   Procedure: Left Heart Cath and Coronary Angiography;  Surgeon: Runell Gess, MD;  Location: [facility] INVASIVE CV LAB;  Service: Cardiovascular;  Laterality: N/A;   CARDIAC CATHETERIZATION N/A 03/20/2015   Procedure: Coronary Stent Intervention;  Surgeon: Runell Gess, MD;  Location: [facility] INVASIVE CV LAB;  Service: Cardiovascular;  Laterality: N/A;   CARDIAC  CATHETERIZATION N/A 03/22/2015   Procedure: Coronary Stent Intervention;  Surgeon: Runell Gess, MD;  Location: [facility] INVASIVE CV LAB;  Service: Cardiovascular;  Laterality: N/A;   CORONARY ARTERY BYPASS GRAFT N/A 09/12/2021   Procedure: CORONARY ARTERY BYPASS GRAFTING (CABG) X 5 USING LEFT INTERNAL MAMMARY ARTERY AND ENDOSCOPICALLY HARVESTED RIGHT GREATER SAPHENOUS VEIN.;  Surgeon: Alleen Borne, MD;  Location: [facility] OR;  Service: Open Heart Surgery;  Laterality: N/A;   EXPLORATION POST OPERATIVE OPEN HEART N/A 09/30/2021   Procedure: EXPLORATION POST  OPERATIVE OPEN HEART WASHOUT;  Surgeon: Corliss Skains, MD;  Location: [facility] OR;  Service: Open Heart Surgery;  Laterality: N/A;   IR FLUORO GUIDE CV LINE LEFT  11/04/2021   IR FLUORO GUIDE CV LINE LEFT  12/06/2021   IR FLUORO GUIDE CV LINE LEFT  04/11/2022   IR GASTROSTOMY TUBE MOD SED  11/15/2021   IR GASTROSTOMY TUBE MOD SED  11/20/2021   IR GASTROSTOMY TUBE REMOVAL  01/20/2022   IR US GUIDE VASC ACCESS LEFT  11/04/2021   IR VENOCAVAGRAM SVC  12/06/2021   IR VENOCAVAGRAM SVC  04/11/2022   LEFT HEART CATH AND CORONARY ANGIOGRAPHY N/A 12/25/2020   Procedure: LEFT HEART CATH AND CORONARY ANGIOGRAPHY;  Surgeon: [name], MD;  [facility]: [facility] INVASIVE CV LAB;  Service: Cardiovascular;  Laterality: N/A;   LEFT HEART CATH AND CORONARY ANGIOGRAPHY N/A 12/26/2020   Procedure: LEFT HEART CATH AND CORONARY ANGIOGRAPHY;  Surgeon: [name], MD;  Location: [facility] INVASIVE CV LAB;  Service: Cardiovascular;  Laterality: N/A;   MEDIASTINAL EXPLORATION N/A 10/03/2021   Procedure: MEDIASTINAL WASHOUT;  Surgeon: Lovett Sox, MD;  Location: [facility] OR;  Service: Thoracic;  Laterality: N/A;  PUMP STANDBY   MEDIASTINAL EXPLORATION N/A 10/12/2021   Procedure: MEDIASTINAL WASHOUT;  Surgeon: Alleen Borne, MD;  Location: [facility] OR;  Service: Thoracic;  Laterality: N/A;   PERCUTANEOUS TRACHEOSTOMY N/A 10/17/2021   Procedure: PERCUTANEOUS TRACHEOSTOMY USING SHILEY FLEXIBLE 8 mm CUFFED TRACH.;  Surgeon: Lorin Glass, MD;  Location: [facility] OR;  Service: Pulmonary;  Laterality: N/A;  Percutaneous tracheostomy   PERICARDIAL WINDOW Right 09/28/2021   Procedure: PERICARDIAL WINDOW;  Surgeon: Corliss Skains, MD;  Location: [facility] OR;  Service: Thoracic;  Laterality: Right;  Right VATS.  Lazy lateral.  double lumen ET tube   PLACEMENT OF IMPELLA LEFT VENTRICULAR ASSIST DEVICE N/A 10/07/2021   Procedure: INSERTION OF RIGHT AXILLARY IMPELLA, DECANNULATION OF ECMO, AND MEDIASTINAL WASHOUT;  Surgeon: Alleen Borne, MD;  Location: [facility]  OR;  Service: Open Heart Surgery;  Laterality: N/A;  Open right axillary with 8 mm Hemashield Platinum Vascular graft.   RADIOLOGY WITH ANESTHESIA N/A 11/20/2021   Procedure: G-Tube Placement;  Surgeon: Simonne Come, MD;  Location: [facility] OR;  Service: Radiology;  Laterality: N/A;   REMOVAL OF IMPELLA LEFT VENTRICULAR ASSIST DEVICE Right 10/17/2021   Procedure: REMOVAL OF IMPELLA LEFT VENTRICULAR ASSIST DEVICE;  Surgeon: Alleen Borne, MD;  Location: [facility] OR;  Service: Open Heart Surgery;  Laterality: Right;   STERNAL CLOSURE N/A 10/12/2021   Procedure: STERNAL CLOSURE;  Surgeon: Alleen Borne, MD;  Location[facility] OR;  Service: Thoracic;  Laterality: N/A;   TEE WITHOUT CARDIOVERSION N/A 09/12/2021   Procedure: TRANSESOPHAGEAL ECHOCARDIOGRAM (TEE);  Surgeon: Alleen Borne, MD;  Location: [facility] OR;  Service: Open Heart Surgery;  Laterality: N/A;   TEE WITHOUT CARDIOVERSION N/A 09/29/2021   Procedure: TRANSESOPHAGEAL ECHOCARDIOGRAM (TEE);  Surgeon: Corliss Skains, MD;  Location: [facility] OR;  Service: Open Heart Surgery;  Laterality: N/A;   TEE WITHOUT CARDIOVERSION N/A 10/03/2021   Procedure: TRANSESOPHAGEAL ECHOCARDIOGRAM (TEE);  Surgeon: Lovett Sox, MD;  Location: [facility] OR;  Service: Thoracic;  Laterality: N/A;   TEE WITHOUT CARDIOVERSION  10/07/2021   Procedure: TRANSESOPHAGEAL ECHOCARDIOGRAM (TEE);  Surgeon: Alleen Borne, MD;  Location: [facility] OR;  Service: Open Heart Surgery;;   TEE WITHOUT CARDIOVERSION N/A 10/12/2021   Procedure: TRANSESOPHAGEAL ECHOCARDIOGRAM (TEE);  Surgeon: Alleen Borne, MD;  Location: [facility] OR;  Service: Thoracic;  Laterality: N/A;   TEE WITHOUT CARDIOVERSION N/A 10/17/2021   Procedure: TRANSESOPHAGEAL ECHOCARDIOGRAM (TEE);  Surgeon: Alleen Borne, MD;  Location: [facility] OR;  Service: Open Heart Surgery;  Laterality: N/A;   TESTICLE SURGERY  1970   pt states testicle was ascended and had to be "pulled down"   TONSILLECTOMY     as a child    History reviewed. No pertinent family  history.  Social History   Socioeconomic History   Marital status: Legally Separated    Spouse name: Not on file   Number of children: 0   Years of education: Not on file   Highest education level: Bachelor's degree (e.g., BA, AB, BS)  Occupational History   Not on file  Tobacco Use   Smoking status: Former    Current packs/day: 0.00    Types: Cigarettes    Quit date: 2017    Years since quitting: 8.2   Smokeless tobacco: Never  Vaping Use   Vaping status: Former   Start date: 10/14/2017   Quit date: 09/09/2021  Substance and Sexual Activity   Alcohol use: Not Currently    Comment: very occasional, maybe a beer or mixed drink once every few months   Drug use: Not Currently   Sexual activity: Not on file  Other Topics Concern   Not on file  Social History Narrative   Right handed   Unemployed   Lives with uncle   No caffeine   Two story home   Social Drivers of Corporate investment banker Strain: Low Risk  (04/27/2023)   Overall Financial Resource Strain (CARDIA)    Difficulty of Paying Living Expenses: Not hard at all  Food Insecurity: No Food Insecurity (04/27/2023)   Hunger Vital Sign    Worried About Running Out of Food in the Last Year: Never true    Ran Out of Food in the Last Year: Never true  Transportation Needs: No Transportation Needs (04/27/2023)   PRAPARE - Administrator, Civil Service (Medical): No    Lack of Transportation (Non-Medical): No  Physical Activity: Unknown (04/27/2023)   Exercise Vital Sign    Days of Exercise per Week: 0 days    Minutes of Exercise per Session: Not on file  Stress: No Stress Concern Present (04/27/2023)   [employer] of Occupational Health - Occupational Stress Questionnaire    Feeling of Stress : Not at all  Social Connections: Socially Isolated (04/27/2023)   Social Connection and Isolation Panel [NHANES]    Frequency of Communication with Friends and Family: More than three times a week    Frequency of  Social Gatherings with Friends and Family: Twice a week    Attends Religious Services: Never    Database administrator or Organizations: No    Attends Banker Meetings: Not on file    Marital Status: Separated  Intimate Partner Violence: Not At Risk (12/06/2021)   Humiliation, Afraid, Rape, and Kick  questionnaire    Fear of Current or Ex-Partner: No    Emotionally Abused: No    Physically Abused: No    Sexually Abused: No    Review of Systems  All other systems reviewed and are negative.       Objective    BP 122/80   Pulse 77   Temp 97.9 F (36.6 C) (Oral)   Resp 18   Ht 5\' 7"  (1.702 m)   Wt 218 lb 6.4 oz (99.1 kg)   SpO2 93%   BMI 34.21 kg/m   Physical Exam Vitals and nursing note reviewed.  Constitutional:      General: He is not in acute distress. HENT:     Head: Normocephalic and atraumatic.     Right Ear: Tympanic membrane, ear canal and external ear normal.     Left Ear: Tympanic membrane, ear canal and external ear normal.     Nose: Nose normal.     Mouth/Throat:     Mouth: Mucous membranes are moist.     Pharynx: Oropharynx is clear.  Eyes:     Conjunctiva/sclera: Conjunctivae normal.     Pupils: Pupils are equal, round, and reactive to light.  Neck:     Thyroid: No thyromegaly.  Cardiovascular:     Rate and Rhythm: Normal rate and regular rhythm.     Heart sounds: Normal heart sounds. No murmur heard. Pulmonary:     Effort: Pulmonary effort is normal.     Breath sounds: Normal breath sounds.  Abdominal:     General: There is no distension.     Palpations: Abdomen is soft. There is no mass.     Tenderness: There is no abdominal tenderness.     Hernia: There is no hernia in the left inguinal area or right inguinal area.  Musculoskeletal:        General: Normal range of motion.     Right hand: No swelling or deformity. Decreased strength.     Cervical back: Normal range of motion and neck supple.     Right lower leg: No edema.      Left lower leg: No edema.  Skin:    General: Skin is warm and dry.  Neurological:     General: No focal deficit present.     Mental Status: He is alert and oriented to person, place, and time. Mental status is at baseline.  Psychiatric:        Mood and Affect: Mood normal.        Behavior: Behavior normal.         Assessment & Plan:   Annual physical exam -     CMP14+EGFR  Screening for lipid disorders -     Lipid panel  Screening for deficiency anemia -     CBC with Differential/Platelet  Type 2 diabetes mellitus with chronic kidney disease on chronic dialysis, with long-term current use of insulin (HCC) -     POCT glycosylated hemoglobin (Hb A1C)  Screening for colon cancer -     Ambulatory referral to Gastroenterology     No follow-ups on file.   Tommie Raymond, MD

## 2023-04-30 LAB — CBC WITH DIFFERENTIAL/PLATELET
Basophils Absolute: 0 10*3/uL (ref 0.0–0.2)
Basos: 1 %
EOS (ABSOLUTE): 0.2 10*3/uL (ref 0.0–0.4)
Eos: 3 %
Hematocrit: 36.7 % — ABNORMAL LOW (ref 37.5–51.0)
Hemoglobin: 12.1 g/dL — ABNORMAL LOW (ref 13.0–17.7)
Immature Grans (Abs): 0 10*3/uL (ref 0.0–0.1)
Immature Granulocytes: 1 %
Lymphocytes Absolute: 1.8 10*3/uL (ref 0.7–3.1)
Lymphs: 24 %
MCH: 32.1 pg (ref 26.6–33.0)
MCHC: 33 g/dL (ref 31.5–35.7)
MCV: 97 fL (ref 79–97)
Monocytes Absolute: 1.3 10*3/uL — ABNORMAL HIGH (ref 0.1–0.9)
Monocytes: 18 %
Neutrophils Absolute: 4.1 10*3/uL (ref 1.4–7.0)
Neutrophils: 53 %
Platelets: 163 10*3/uL (ref 150–450)
RBC: 3.77 x10E6/uL — ABNORMAL LOW (ref 4.14–5.80)
RDW: 15.4 % (ref 11.6–15.4)
WBC: 7.4 10*3/uL (ref 3.4–10.8)

## 2023-04-30 LAB — CMP14+EGFR
ALT: 10 IU/L (ref 0–44)
AST: 11 IU/L (ref 0–40)
Albumin: 4.6 g/dL (ref 3.9–4.9)
Alkaline Phosphatase: 153 IU/L — ABNORMAL HIGH (ref 44–121)
BUN/Creatinine Ratio: 5 — ABNORMAL LOW (ref 10–24)
BUN: 37 mg/dL — ABNORMAL HIGH (ref 8–27)
Bilirubin Total: 0.5 mg/dL (ref 0.0–1.2)
CO2: 28 mmol/L (ref 20–29)
Calcium: 9.3 mg/dL (ref 8.6–10.2)
Chloride: 94 mmol/L — ABNORMAL LOW (ref 96–106)
Creatinine, Ser: 7.77 mg/dL — ABNORMAL HIGH (ref 0.76–1.27)
Globulin, Total: 2.1 g/dL (ref 1.5–4.5)
Glucose: 113 mg/dL — ABNORMAL HIGH (ref 70–99)
Potassium: 4.3 mmol/L (ref 3.5–5.2)
Sodium: 141 mmol/L (ref 134–144)
Total Protein: 6.7 g/dL (ref 6.0–8.5)
eGFR: 7 mL/min/{1.73_m2} — ABNORMAL LOW (ref >59)

## 2023-04-30 LAB — LIPID PANEL
Chol/HDL Ratio: 2.9 ratio (ref 0.0–5.0)
Cholesterol, Total: 122 mg/dL (ref 100–199)
HDL: 42 mg/dL (ref >39)
LDL Chol Calc (NIH): 64 mg/dL (ref 0–99)
Triglycerides: 82 mg/dL (ref 0–149)
VLDL Cholesterol Cal: 16 mg/dL (ref 5–40)

## 2023-06-08 ENCOUNTER — Other Ambulatory Visit: Payer: Self-pay

## 2023-06-22 ENCOUNTER — Ambulatory Visit: Payer: Medicare Other | Admitting: Podiatry

## 2023-06-29 ENCOUNTER — Encounter: Payer: Self-pay | Admitting: Family Medicine

## 2023-07-14 DIAGNOSIS — Z992 Dependence on renal dialysis: Secondary | ICD-10-CM | POA: Diagnosis not present

## 2023-07-14 DIAGNOSIS — D631 Anemia in chronic kidney disease: Secondary | ICD-10-CM | POA: Diagnosis not present

## 2023-07-14 DIAGNOSIS — N186 End stage renal disease: Secondary | ICD-10-CM | POA: Diagnosis not present

## 2023-07-14 DIAGNOSIS — E876 Hypokalemia: Secondary | ICD-10-CM | POA: Diagnosis not present

## 2023-07-14 DIAGNOSIS — E8770 Fluid overload, unspecified: Secondary | ICD-10-CM | POA: Diagnosis not present

## 2023-07-14 DIAGNOSIS — N2581 Secondary hyperparathyroidism of renal origin: Secondary | ICD-10-CM | POA: Diagnosis not present

## 2023-07-15 DIAGNOSIS — E8770 Fluid overload, unspecified: Secondary | ICD-10-CM | POA: Diagnosis not present

## 2023-07-15 DIAGNOSIS — Z992 Dependence on renal dialysis: Secondary | ICD-10-CM | POA: Diagnosis not present

## 2023-07-15 DIAGNOSIS — N2581 Secondary hyperparathyroidism of renal origin: Secondary | ICD-10-CM | POA: Diagnosis not present

## 2023-07-15 DIAGNOSIS — E876 Hypokalemia: Secondary | ICD-10-CM | POA: Diagnosis not present

## 2023-07-15 DIAGNOSIS — D631 Anemia in chronic kidney disease: Secondary | ICD-10-CM | POA: Diagnosis not present

## 2023-07-15 DIAGNOSIS — N186 End stage renal disease: Secondary | ICD-10-CM | POA: Diagnosis not present

## 2023-07-16 DIAGNOSIS — Z992 Dependence on renal dialysis: Secondary | ICD-10-CM | POA: Diagnosis not present

## 2023-07-16 DIAGNOSIS — E8770 Fluid overload, unspecified: Secondary | ICD-10-CM | POA: Diagnosis not present

## 2023-07-16 DIAGNOSIS — N2581 Secondary hyperparathyroidism of renal origin: Secondary | ICD-10-CM | POA: Diagnosis not present

## 2023-07-16 DIAGNOSIS — E876 Hypokalemia: Secondary | ICD-10-CM | POA: Diagnosis not present

## 2023-07-16 DIAGNOSIS — D631 Anemia in chronic kidney disease: Secondary | ICD-10-CM | POA: Diagnosis not present

## 2023-07-16 DIAGNOSIS — N186 End stage renal disease: Secondary | ICD-10-CM | POA: Diagnosis not present

## 2023-07-18 DIAGNOSIS — E876 Hypokalemia: Secondary | ICD-10-CM | POA: Diagnosis not present

## 2023-07-18 DIAGNOSIS — D631 Anemia in chronic kidney disease: Secondary | ICD-10-CM | POA: Diagnosis not present

## 2023-07-18 DIAGNOSIS — N2581 Secondary hyperparathyroidism of renal origin: Secondary | ICD-10-CM | POA: Diagnosis not present

## 2023-07-18 DIAGNOSIS — E8770 Fluid overload, unspecified: Secondary | ICD-10-CM | POA: Diagnosis not present

## 2023-07-18 DIAGNOSIS — Z992 Dependence on renal dialysis: Secondary | ICD-10-CM | POA: Diagnosis not present

## 2023-07-18 DIAGNOSIS — N186 End stage renal disease: Secondary | ICD-10-CM | POA: Diagnosis not present

## 2023-07-21 DIAGNOSIS — N2581 Secondary hyperparathyroidism of renal origin: Secondary | ICD-10-CM | POA: Diagnosis not present

## 2023-07-21 DIAGNOSIS — N186 End stage renal disease: Secondary | ICD-10-CM | POA: Diagnosis not present

## 2023-07-21 DIAGNOSIS — E876 Hypokalemia: Secondary | ICD-10-CM | POA: Diagnosis not present

## 2023-07-21 DIAGNOSIS — Z992 Dependence on renal dialysis: Secondary | ICD-10-CM | POA: Diagnosis not present

## 2023-07-21 DIAGNOSIS — D631 Anemia in chronic kidney disease: Secondary | ICD-10-CM | POA: Diagnosis not present

## 2023-07-21 DIAGNOSIS — E8770 Fluid overload, unspecified: Secondary | ICD-10-CM | POA: Diagnosis not present

## 2023-07-23 DIAGNOSIS — Z992 Dependence on renal dialysis: Secondary | ICD-10-CM | POA: Diagnosis not present

## 2023-07-23 DIAGNOSIS — N2581 Secondary hyperparathyroidism of renal origin: Secondary | ICD-10-CM | POA: Diagnosis not present

## 2023-07-23 DIAGNOSIS — E8770 Fluid overload, unspecified: Secondary | ICD-10-CM | POA: Diagnosis not present

## 2023-07-23 DIAGNOSIS — N186 End stage renal disease: Secondary | ICD-10-CM | POA: Diagnosis not present

## 2023-07-23 DIAGNOSIS — E876 Hypokalemia: Secondary | ICD-10-CM | POA: Diagnosis not present

## 2023-07-23 DIAGNOSIS — D631 Anemia in chronic kidney disease: Secondary | ICD-10-CM | POA: Diagnosis not present

## 2023-07-25 DIAGNOSIS — E8770 Fluid overload, unspecified: Secondary | ICD-10-CM | POA: Diagnosis not present

## 2023-07-25 DIAGNOSIS — D631 Anemia in chronic kidney disease: Secondary | ICD-10-CM | POA: Diagnosis not present

## 2023-07-25 DIAGNOSIS — N186 End stage renal disease: Secondary | ICD-10-CM | POA: Diagnosis not present

## 2023-07-25 DIAGNOSIS — E876 Hypokalemia: Secondary | ICD-10-CM | POA: Diagnosis not present

## 2023-07-25 DIAGNOSIS — Z992 Dependence on renal dialysis: Secondary | ICD-10-CM | POA: Diagnosis not present

## 2023-07-25 DIAGNOSIS — N2581 Secondary hyperparathyroidism of renal origin: Secondary | ICD-10-CM | POA: Diagnosis not present

## 2023-07-28 DIAGNOSIS — E876 Hypokalemia: Secondary | ICD-10-CM | POA: Diagnosis not present

## 2023-07-28 DIAGNOSIS — E8770 Fluid overload, unspecified: Secondary | ICD-10-CM | POA: Diagnosis not present

## 2023-07-28 DIAGNOSIS — N2581 Secondary hyperparathyroidism of renal origin: Secondary | ICD-10-CM | POA: Diagnosis not present

## 2023-07-28 DIAGNOSIS — Z992 Dependence on renal dialysis: Secondary | ICD-10-CM | POA: Diagnosis not present

## 2023-07-28 DIAGNOSIS — N186 End stage renal disease: Secondary | ICD-10-CM | POA: Diagnosis not present

## 2023-07-28 DIAGNOSIS — D631 Anemia in chronic kidney disease: Secondary | ICD-10-CM | POA: Diagnosis not present

## 2023-07-30 DIAGNOSIS — E8770 Fluid overload, unspecified: Secondary | ICD-10-CM | POA: Diagnosis not present

## 2023-07-30 DIAGNOSIS — E876 Hypokalemia: Secondary | ICD-10-CM | POA: Diagnosis not present

## 2023-07-30 DIAGNOSIS — Z992 Dependence on renal dialysis: Secondary | ICD-10-CM | POA: Diagnosis not present

## 2023-07-30 DIAGNOSIS — N186 End stage renal disease: Secondary | ICD-10-CM | POA: Diagnosis not present

## 2023-07-30 DIAGNOSIS — N2581 Secondary hyperparathyroidism of renal origin: Secondary | ICD-10-CM | POA: Diagnosis not present

## 2023-07-30 DIAGNOSIS — D631 Anemia in chronic kidney disease: Secondary | ICD-10-CM | POA: Diagnosis not present

## 2023-08-01 DIAGNOSIS — N2581 Secondary hyperparathyroidism of renal origin: Secondary | ICD-10-CM | POA: Diagnosis not present

## 2023-08-01 DIAGNOSIS — N186 End stage renal disease: Secondary | ICD-10-CM | POA: Diagnosis not present

## 2023-08-01 DIAGNOSIS — Z992 Dependence on renal dialysis: Secondary | ICD-10-CM | POA: Diagnosis not present

## 2023-08-01 DIAGNOSIS — E8770 Fluid overload, unspecified: Secondary | ICD-10-CM | POA: Diagnosis not present

## 2023-08-01 DIAGNOSIS — E876 Hypokalemia: Secondary | ICD-10-CM | POA: Diagnosis not present

## 2023-08-01 DIAGNOSIS — D631 Anemia in chronic kidney disease: Secondary | ICD-10-CM | POA: Diagnosis not present

## 2023-08-04 ENCOUNTER — Telehealth: Payer: Self-pay | Admitting: Internal Medicine

## 2023-08-04 DIAGNOSIS — Z992 Dependence on renal dialysis: Secondary | ICD-10-CM | POA: Diagnosis not present

## 2023-08-04 DIAGNOSIS — N186 End stage renal disease: Secondary | ICD-10-CM | POA: Diagnosis not present

## 2023-08-04 DIAGNOSIS — E876 Hypokalemia: Secondary | ICD-10-CM | POA: Diagnosis not present

## 2023-08-04 DIAGNOSIS — N2581 Secondary hyperparathyroidism of renal origin: Secondary | ICD-10-CM | POA: Diagnosis not present

## 2023-08-04 DIAGNOSIS — D631 Anemia in chronic kidney disease: Secondary | ICD-10-CM | POA: Diagnosis not present

## 2023-08-04 DIAGNOSIS — E8770 Fluid overload, unspecified: Secondary | ICD-10-CM | POA: Diagnosis not present

## 2023-08-04 NOTE — Telephone Encounter (Signed)
 Spoke with pt over the phone who stated that he has been sitting in the 120s with his HR but was at 135 today during dialysis and was told by the dialysis care team to call our office. Pt denies any other symptoms. Earliest appt availability for pt is 7/2 with Vannie, NP. Appt scheduled to get pt on the books. Will send to [name], PA for review as well.

## 2023-08-04 NOTE — Telephone Encounter (Signed)
 He may be back in atrial fibrillation. Does he know what his blood pressure is?  If he is short of breath at rest, he should go to the ED. If his HR is staying 130 or higher, he should go to the ED. [name], PA-C    08/04/2023 4:53 PM

## 2023-08-04 NOTE — Telephone Encounter (Signed)
  Per MyChart scheduling message:  Pt c/o Shortness Of Breath: STAT if SOB developed within the last 24 hours or pt is noticeably SOB on the phone  1. Are you currently SOB (can you hear that pt is SOB on the phone)?   2. How long have you been experiencing SOB?   3. Are you SOB when sitting or when up moving around?   4. Are you currently experiencing any other symptoms?     I become winded very easily for several months.  My heart rate is currently 135.  No other symptoms.  It happens when I walk small distances.  I'm afraid and would like to be seen an a m/w/f ASAP.

## 2023-08-05 NOTE — Telephone Encounter (Signed)
 Spoke with pt and explained Scott's recommendations. Pt stated that he has not taken his BP yesterday or today. Pt verbalized understanding of plan and thanked us  for calling and letting him know.

## 2023-08-06 DIAGNOSIS — N186 End stage renal disease: Secondary | ICD-10-CM | POA: Diagnosis not present

## 2023-08-06 DIAGNOSIS — E876 Hypokalemia: Secondary | ICD-10-CM | POA: Diagnosis not present

## 2023-08-06 DIAGNOSIS — D631 Anemia in chronic kidney disease: Secondary | ICD-10-CM | POA: Diagnosis not present

## 2023-08-06 DIAGNOSIS — Z992 Dependence on renal dialysis: Secondary | ICD-10-CM | POA: Diagnosis not present

## 2023-08-06 DIAGNOSIS — N2581 Secondary hyperparathyroidism of renal origin: Secondary | ICD-10-CM | POA: Diagnosis not present

## 2023-08-06 DIAGNOSIS — E8770 Fluid overload, unspecified: Secondary | ICD-10-CM | POA: Diagnosis not present

## 2023-08-08 DIAGNOSIS — E876 Hypokalemia: Secondary | ICD-10-CM | POA: Diagnosis not present

## 2023-08-08 DIAGNOSIS — Z992 Dependence on renal dialysis: Secondary | ICD-10-CM | POA: Diagnosis not present

## 2023-08-08 DIAGNOSIS — N186 End stage renal disease: Secondary | ICD-10-CM | POA: Diagnosis not present

## 2023-08-08 DIAGNOSIS — N2581 Secondary hyperparathyroidism of renal origin: Secondary | ICD-10-CM | POA: Diagnosis not present

## 2023-08-08 DIAGNOSIS — E8770 Fluid overload, unspecified: Secondary | ICD-10-CM | POA: Diagnosis not present

## 2023-08-08 DIAGNOSIS — D631 Anemia in chronic kidney disease: Secondary | ICD-10-CM | POA: Diagnosis not present

## 2023-08-10 ENCOUNTER — Encounter (HOSPITAL_BASED_OUTPATIENT_CLINIC_OR_DEPARTMENT_OTHER): Payer: Self-pay

## 2023-08-10 ENCOUNTER — Telehealth: Payer: Self-pay

## 2023-08-10 DIAGNOSIS — E1122 Type 2 diabetes mellitus with diabetic chronic kidney disease: Secondary | ICD-10-CM | POA: Diagnosis not present

## 2023-08-10 DIAGNOSIS — I5032 Chronic diastolic (congestive) heart failure: Secondary | ICD-10-CM | POA: Diagnosis not present

## 2023-08-10 DIAGNOSIS — Z992 Dependence on renal dialysis: Secondary | ICD-10-CM | POA: Diagnosis not present

## 2023-08-10 DIAGNOSIS — I2510 Atherosclerotic heart disease of native coronary artery without angina pectoris: Secondary | ICD-10-CM | POA: Diagnosis not present

## 2023-08-10 DIAGNOSIS — I10 Essential (primary) hypertension: Secondary | ICD-10-CM | POA: Diagnosis not present

## 2023-08-10 DIAGNOSIS — N186 End stage renal disease: Secondary | ICD-10-CM | POA: Diagnosis not present

## 2023-08-10 DIAGNOSIS — E1129 Type 2 diabetes mellitus with other diabetic kidney complication: Secondary | ICD-10-CM | POA: Diagnosis not present

## 2023-08-10 NOTE — Telephone Encounter (Signed)
 Copied from CRM [id number]. Topic: Clinical - Medical Advice >> Aug 10, 2023 12:32 PM Corin V wrote: Reason for CRM: Patient is needing to know his blood type. Please call back [phone]. Please left voice mail if he does not answer.

## 2023-08-10 NOTE — Progress Notes (Unsigned)
 Cardiology Office Note    Date:  08/12/2023  ID:  [name], DOB 1960-11-11, MRN [id number] PCP:  Tanda Bleacher, MD  Cardiologist:  Lurena MARLA Red, MD  Electrophysiologist:  None   Chief Complaint: Tachycardia   History of Present Illness: .    [name] is a 63 y.o. male with visit-pertinent history of ESRD on HD, CAD s/p CABG in 09/2021, paroxysmal atrial fibs/flutter, type II DM, history of CVA in 12/2020, history of PEA arrest in setting of cardiac tamponade in 09/2021 s/p pericardial window, hypertension, hyperlipidemia, alcohol  abuse, tobacco use.  Patient with extensive history of CAD and previously had stents placed to the LAD, high marginal and distal circumflex.  Patient underwent CABG x 5 on 09/12/2021 with LIMA to LAD, SVG diagonal, SVG to OM1-OM2, SVG to dRCA.  He did have postoperative atrial fibrillation and he was treated with carvedilol  and Eliquis .  On 09/28/2021 he presented to the hospital complaining of lethargy and dyspnea on exertion.  Echo on 09/28/2021 showed EF 50%, grade 2 diastolic dysfunction, moderate left-sided pericardial effusion with evidence of possible tamponade.  He underwent surgical evacuation of pericardial hematoma on 8/19.  After the procedure patient had recurrent atrial fibrillation and was treated with IV amiodarone .  He developed bradycardia and PEA arrest.  Patient was intubated and bedside echocardiogram showed a compressed LV with large posterior effusion/clot.  He was intubated and arrested a second time.  Dr. Shyrl performed emergent bedside sternotomy with removal of a large amount of clot, later that day patient had a third recurrent cardiac arrest.  The chest was unpacked, pericardium was suctioned and Dr. Cherrie performed cardiac massage.  Patient was taken emergently to the OR for washout and [facility] ECMO support.  Again required over washout on 09/30/2021.  On 10/01/2021 patient developed A-fib and was treated with IV amiodarone .  Repeat  washout on 10/03/2021 and had an Impella placed on 8/28, ECMO was discontinued.  On 8/29 he had a PEA arrest and later had elevated heart rate and atrial flutter despite multiple boluses of amiodarone .  He underwent DCCV with conversion to normal sinus rhythm.  Had chest close on 9/2 and Impella was extracted on 9/7.  He had a percutaneous tracheostomy placed on 9/7 as well.  Patient was transmitted to [facility] on 9/21 and had a tunneled HD cath placed on 9/25.  He was eventually discharged on 12/03/2021.  Limited echo on 10/23/2021 showed LVEF 65 to 70%, no RWMA, G1 DD, mildly reduced RV systolic function.  On 05/15/2022 patient presented to ED from dialysis with tachycardia.  Per EMS the dialysis center noted patient's heart rate had jumped to the 140s.  Patient complained of intermittent chest pain and shortness of breath.  In the ED EKG showed atrial flutter with heart rate 143 bpm.  Labs showed NA 135, K4.4, creatinine 7.4.  Patient converted to normal sinus rhythm with heart rate in the 100s after receiving p.o. metoprolol .  Patient was last seen in clinic on 01/14/2023 by [name], PA.  Patient reported exertional dyspnea over the prior few months, he noted difficulty walking from the car to the clinic due to breathlessness, necessitating rest.  He denied any chest discomfort or pain, orthopnea or lower extremity edema.  Patient acknowledged suboptimal adherence to his dialysis regimen, often leaving before recommended 4-hour session was completed.  Patient's ECG showed persistent inferior lateral and anterolateral T wave inversions, it was recommended that patient have Lexiscan Myoview to rule out  graft failure, was not completed.  Patient was instructed to follow-up in 4 to 6 weeks, was lost to follow-up.  Today he presents regarding increased heart rate, patient reports that for the last several weeks his heart rate has consistently been in the 120's when he presents to dialysis. He denies chest pain,  endorses increased dyspnea on exertion. Patient reports he was previously told at dialysis to hold his metoprolol  and amiodarone  as his blood pressure was low, notes this was months ago. He reports he has not been taking his medications consistently until the last two weeks when he noticed his heart rate would increase into the 130's. He denies increased lower extremity edema, orthopnea or pnd, reports adherence with dialysis on Tuesdays, Thursdays and Saturdays. He notes he has only been taking his Eliquis  once daily for the last two weeks, was intermittently prior to this.  He reports that he has been taking his metoprolol  and amiodarone  regularly for the last 2 weeks when he noticed his heart rate increasing more.  Labwork independently reviewed: 08/10/2023: Hemoglobin 11.7, hematocrit 35.5, sodium 142, potassium 3.6, creatinine 8.94, magnesium  2.1 ROS: .   Today he denies chest pain, lower extremity edema, fatigue, palpitations, melena, hematuria, hemoptysis, diaphoresis, weakness, presyncope, syncope, orthopnea, and PND.  All other systems are reviewed and otherwise negative. Studies Reviewed: SABRA   EKG:  EKG is ordered today, personally reviewed, demonstrating  EKG Interpretation Date/Time:  Wednesday August 12 2023 15:06:55 EDT Ventricular Rate:  120 PR Interval:  132 QRS Duration:  98 QT Interval:  350 QTC Calculation: 494 R Axis:   80  Text Interpretation: Atrial flutter Nonspecific ST and T wave abnormality Confirmed by Crystelle Ferrufino [phone]) on 08/12/2023 8:11:40 PM   CV Studies: Cardiac studies reviewed are outlined and summarized above. Otherwise please see EMR for full report. Cardiac Studies & Procedures   ______________________________________________________________________________________________ CARDIAC CATHETERIZATION  CARDIAC CATHETERIZATION 12/26/2020  Conclusion   Prox Cx lesion is 60% stenosed.   Prox Cx to Mid Cx lesion is 90% stenosed.   1st Mrg-2 lesion is 80%  stenosed.   Mid LAD lesion is 50% stenosed.   Mid LAD to Dist LAD lesion is 80% stenosed.   2nd Diag lesion is 80% stenosed.   RPAV lesion is 50% stenosed.   RV Branch-1 lesion is 70% stenosed.   RV Branch-2 lesion is 80% stenosed.   Prox RCA-1 lesion is 90% stenosed.   Prox RCA to Mid RCA lesion is 95% stenosed.   Mid RCA lesion is 20% stenosed.   Prox RCA-2 lesion is 65% stenosed.   Dist RCA lesion is 20% stenosed.   1st Diag lesion is 90% stenosed.   Prox LAD lesion is 30% stenosed.   Previously placed Mid Cx to Dist Cx stent (unknown type) is  widely patent.   Previously placed 1st Mrg-1 stent (unknown type) is  widely patent.   LV end diastolic pressure is severely elevated.  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  In this diabetic male with significant diffuse disease in the LAD, left circumflex, and right coronary artery recommend surgical evaluation for consideration of CABG revascularization.  RECOMMENDATION: Surgical consultation for consideration of CABG.  We will reinitiate heparin  later this evening.  Patient continues to be on intravenous nitroglycerin .  Increase medical therapy as blood pressure and heart rate allow.  Aggressive lipid-lowering therapy with target LDL in the 50s or  below.  Findings Coronary Findings Diagnostic  Dominance: Right  Left Anterior Descending Prox LAD lesion is 30% stenosed. Mid LAD lesion is 50% stenosed. The lesion was previously treated . Mid LAD to Dist LAD lesion is 80% stenosed.  First Diagonal Branch Collaterals 1st Diag filled by collaterals from Dist LAD.  1st Diag lesion is 90% stenosed.  Second Diagonal Branch 2nd Diag lesion is 80% stenosed.  Left Circumflex Prox Cx lesion is 60% stenosed. Prox Cx to Mid Cx lesion is 90% stenosed. Previously placed Mid Cx to Dist Cx stent (unknown type) is  widely  patent.  First Obtuse Marginal Branch Previously placed 1st Mrg-1 stent (unknown type) is  widely patent. 1st Mrg-2 lesion is 80% stenosed.  Right Coronary Artery Prox RCA-1 lesion is 90% stenosed. Prox RCA-2 lesion is 65% stenosed. Prox RCA to Mid RCA lesion is 95% stenosed. Mid RCA lesion is 20% stenosed. Dist RCA lesion is 20% stenosed.  Right Ventricular Branch RV Branch-1 lesion is 70% stenosed. RV Branch-2 lesion is 80% stenosed.  Right Posterior Atrioventricular Artery RPAV lesion is 50% stenosed.  Intervention  No interventions have been documented.   CARDIAC CATHETERIZATION 12/25/2020  Conclusion   Prox RCA lesion is 90% stenosed.   RV Branch-1 lesion is 70% stenosed.   RV Branch-2 lesion is 80% stenosed.   Prox RCA to Mid RCA lesion is 90% stenosed.   Mid RCA lesion is 20% stenosed.   Mid RCA to Dist RCA lesion is 30% stenosed.   2nd RPL lesion is 50% stenosed.  There is severe CAD in a dominant right coronary artery with 90% proximal and mid stenoses, diffuse 70 and 80% stenoses in an RV marginal branch, and mid distal 20 and 30% stenoses.  Due to innominate vessel tortuosity as well as significant radial vasospasm, the left coronary system was unable to be cannulated despite attempts with multiple catheters and additional verapamil , intra-arterial nitroglycerin  and IV nitroglycerin .  Will initiate IV heparin  8 hours post procedure and plan for completion of the diagnostic catheterization to the left coronary system tomorrow via the femoral approach and plan for probable PCI to the RCA depending upon left coronary findings.  Findings Coronary Findings Diagnostic  Dominance: Right  Right Coronary Artery There is mild diffuse disease throughout the vessel. Prox RCA lesion is 90% stenosed. Prox RCA to Mid RCA lesion is 90% stenosed. Mid RCA lesion is 20% stenosed. Mid RCA to Dist RCA lesion is 30% stenosed.  Right Ventricular Branch Vessel is small in  size. RV Branch-1 lesion is 70% stenosed. RV Branch-2 lesion is 80% stenosed.  Second Right Posterolateral Branch 2nd RPL lesion is 50% stenosed.  Intervention  No interventions have been documented.     ECHOCARDIOGRAM  ECHOCARDIOGRAM COMPLETE 05/16/2022  Narrative ECHOCARDIOGRAM REPORT    Patient Name:   EDISON NICHOLSON [facility] Date of Exam: 05/16/2022 Medical Rec #:  [id number]    Height:       67.0 in Accession #:    [id number]   Weight:       175.0 lb Date of Birth:  05-01-1960    BSA:          1.911 m Patient Age:    62 years     BP:           133/103 mmHg Patient Gender: M            HR:           88 bpm. Exam Location:  Inpatient  Procedure: 2D Echo, Cardiac Doppler and Color Doppler  Indications:    R07.9* Chest pain, unspecified, Dyspnea  History:        Patient has prior history of Echocardiogram examinations. CAD, Prior CABG, Stroke; Risk Factors:Hypertension, Dyslipidemia and Diabetes.  Sonographer:    Morna Luis Referring Phys: [id number] ROLLO JONELLE LOUDER  IMPRESSIONS   1. Left ventricular ejection fraction, by estimation, is 60 to 65%. The left ventricle has normal function. The left ventricle has no regional wall motion abnormalities. There is mild concentric left ventricular hypertrophy. Left ventricular diastolic parameters were normal. 2. Right ventricular systolic function is normal. The right ventricular size is normal. Tricuspid regurgitation signal is inadequate for assessing PA pressure. 3. The mitral valve is degenerative. No evidence of mitral valve regurgitation. No evidence of mitral stenosis. 4. The aortic valve has an indeterminant number of cusps. There is moderate calcification of the aortic valve. There is moderate thickening of the aortic valve. Aortic valve regurgitation is trivial. Aortic valve sclerosis/calcification is present, without any evidence of aortic stenosis. 5. The inferior vena cava was not well visualized.  FINDINGS Left  Ventricle: Left ventricular ejection fraction, by estimation, is 60 to 65%. The left ventricle has normal function. The left ventricle has no regional wall motion abnormalities. The left ventricular internal cavity size was normal in size. There is mild concentric left ventricular hypertrophy. Left ventricular diastolic parameters were normal. Normal left ventricular filling pressure.  Right Ventricle: The right ventricular size is normal. No increase in right ventricular wall thickness. Right ventricular systolic function is normal. Tricuspid regurgitation signal is inadequate for assessing PA pressure.  Left [facility]: Left atrial size was normal in size.  Right [facility]: Right atrial size was normal in size.  Pericardium: There is no evidence of pericardial effusion.  Mitral Valve: The mitral valve is degenerative in appearance. There is moderate thickening of the mitral valve leaflet(s). There is moderate calcification of the mitral valve leaflet(s). No evidence of mitral valve regurgitation. No evidence of mitral valve stenosis.  Tricuspid Valve: The tricuspid valve is normal in structure. Tricuspid valve regurgitation is not demonstrated. No evidence of tricuspid stenosis.  Aortic Valve: The aortic valve has an indeterminant number of cusps. There is moderate calcification of the aortic valve. There is moderate thickening of the aortic valve. Aortic valve regurgitation is trivial. Aortic regurgitation PHT measures 277 msec. Aortic valve sclerosis/calcification is present, without any evidence of aortic stenosis.  Pulmonic Valve: The pulmonic valve was normal in structure. Pulmonic valve regurgitation is not visualized. No evidence of pulmonic stenosis.  Aorta: The aortic root is normal in size and structure.  Venous: The inferior vena cava was not well visualized.  IAS/Shunts: No atrial level shunt detected by color flow Doppler.   LEFT VENTRICLE PLAX 2D LVIDd:         4.10 cm       Diastology LVIDs:         2.50 cm      LV e' medial:    9.25 cm/s LV PW:         1.20 cm      LV E/e' medial:  7.3 LV IVS:        1.20 cm      LV e' lateral:   10.80 cm/s LVOT diam:     1.90 cm      LV E/e' lateral: 6.2 LV SV:         37 LV SV Index:   19 LVOT Area:  2.84 cm  LV Volumes (MOD) LV vol d, MOD A2C: 108.0 ml LV vol d, MOD A4C: 99.9 ml LV vol s, MOD A2C: 39.6 ml LV vol s, MOD A4C: 38.4 ml LV SV MOD A2C:     68.4 ml LV SV MOD A4C:     99.9 ml LV SV MOD BP:      66.2 ml  RIGHT VENTRICLE RV Basal diam:  3.30 cm RV S prime:     8.50 cm/s TAPSE (M-mode): 1.1 cm  LEFT ATRIUM             Index        RIGHT ATRIUM           Index LA diam:        3.50 cm 1.83 cm/m   RA Area:     15.90 cm LA Vol (A2C):   41.1 ml 21.51 ml/m  RA Volume:   41.90 ml  21.93 ml/m LA Vol (A4C):   38.5 ml 20.15 ml/m LA Biplane Vol: 39.8 ml 20.83 ml/m AORTIC VALVE LVOT Vmax:   65.30 cm/s LVOT Vmean:  43.300 cm/s LVOT VTI:    0.129 m AI PHT:      277 msec  AORTA Ao Root diam: 2.90 cm  MITRAL VALVE MV Area (PHT): 4.49 cm    SHUNTS MV Decel Time: 169 msec    Systemic VTI:  0.13 m MV E velocity: 67.30 cm/s  Systemic Diam: 1.90 cm MV A velocity: 50.60 cm/s MV E/A ratio:  1.33  Wilbert Bihari MD Electronically signed by Wilbert Bihari MD Signature Date/Time: 05/16/2022/2:09:37 PM    Final   TEE  ECHO INTRAOPERATIVE TEE 10/12/2021  Narrative *INTRAOPERATIVE TRANSESOPHAGEAL REPORT *    Patient Name:   CHANAN DETWILER [facility]   Date of Exam: 10/12/2021 Medical Rec #:  [id number]      Height:       67.0 in Accession #:    [id number]     Weight:       235.0 lb Date of Birth:  1960-11-20      BSA:          2.17 m Patient Age:    61 years       BP:           122/56 mmHg Patient Gender: M              HR:           63 bpm. Exam Location:  Anesthesiology  Transesophogeal exam was perform intraoperatively during surgical procedure. Patient was closely monitored under general anesthesia during the  entirety of examination.  Indications:     Open Chest Performing Phys: [id number] BRYAN K BARTLE  Complications: No known complications during this procedure. PRE-OP  FINDINGS Left Ventricle: The left ventricle has mildly reduced systolic function, with an ejection fraction of 45-50%. The cavity size was normal. There is mildly increased left ventricular wall thickness. Pt on epinephrine  drip.   Right Ventricle: The right ventricle has moderately reduced systolic function. The cavity was normal. There is no increase in right ventricular wall thickness.  Left [facility]: Left atrial size was not assessed. No left atrial/left atrial appendage thrombus was detected.  Right [facility]: Right atrial size was not assessed.  Interatrial Septum: No atrial level shunt detected by color flow Doppler.  Pericardium: There is no evidence of pericardial effusion.  Mitral Valve: The mitral valve is normal in structure. Mitral valve regurgitation is trivial by color flow Doppler.  Tricuspid Valve: The tricuspid valve was normal in structure. Tricuspid valve regurgitation was not visualized by color flow Doppler.  Aortic Valve: The aortic valve is normal in structure. Aortic valve regurgitation is mild by color flow Doppler. Impella in good position across the AV.  Pulmonic Valve: The pulmonic valve was normal in structure. Pulmonic valve regurgitation is not visualized by color flow Doppler.    Elsie Needle MD Electronically signed by Elsie Needle MD Signature Date/Time: 10/12/2021/11:10:32 AM   Final        ______________________________________________________________________________________________       Current Reported Medications:.    Current Meds  Medication Sig   amiodarone  (PACERONE ) 200 MG tablet Take 1 tablet (200 mg total) by mouth daily.   apixaban  (ELIQUIS ) 5 MG TABS tablet Take 1 tablet (5 mg total) by mouth 2 (two) times daily.   atorvastatin  (LIPITOR ) 80 MG tablet  Take 1 tablet (80 mg total) by mouth daily.   Cinacalcet HCl (SENSIPAR PO) Take 60 mg by mouth.   cycloSPORINE  (RESTASIS ) 0.05 % ophthalmic emulsion Place 1 drop into both eyes 2 (two) times daily.   leptospermum manuka honey (MEDIHONEY) PSTE paste Apply topically.   LOPERAMIDE  HCL PO Take 2 mg by mouth.   loteprednol  (LOTEMAX ) 0.5 % ophthalmic suspension Place 1 drop into both eyes daily.   Methoxy PEG-Epoetin Beta (MIRCERA IJ) Mircera   Methoxy PEG-Epoetin Beta (MIRCERA IJ) 120 mcg.   Methoxy PEG-Epoetin Beta (MIRCERA IJ) Mircera   Methoxy PEG-Epoetin Beta (MIRCERA IJ) 75 mcg.   multivitamin (RENA-VIT) TABS tablet Take 1 tablet by mouth.   sevelamer  carbonate (RENVELA ) 800 MG tablet Take 1,600 mg by mouth.   [DISCONTINUED] metoprolol  succinate (TOPROL -XL) 25 MG 24 hr tablet Take 1 tablet (25 mg total) by mouth daily.   Physical Exam:    VS:  BP 104/68   Pulse (!) 120   Ht 5' 7 (1.702 m)   Wt 219 lb 3.2 oz (99.4 kg)   SpO2 94%   BMI 34.33 kg/m    Wt Readings from Last 3 Encounters:  08/12/23 219 lb 3.2 oz (99.4 kg)  04/29/23 218 lb 6.4 oz (99.1 kg)  01/14/23 203 lb 3.2 oz (92.2 kg)    GEN: Well nourished, well developed in no acute distress NECK: No JVD; No carotid bruits CARDIAC: tachycardia, regular rhythm, no murmurs, rubs, gallops RESPIRATORY:  Clear to auscultation without rales, wheezing or rhonchi  ABDOMEN: Soft, non-tender, non-distended EXTREMITIES:  No edema; No acute deformity     Asessement and Plan:.    Persistent atrial fibrillation/flutter: Patient with history of paroxysmal atrial fibrillation/flutter.  Patient reports that for the last few weeks/months his heart rate has been sustaining around 120, the last 2 weeks he notes that it was increasing regularly into the 130s when he was at dialysis.  Patient reports that he had been nonadherent with his Eliquis , amiodarone  and metoprolol  in recent months.  He reports that 2 weeks ago he started taking his amiodarone   and metoprolol  regularly, was only taking Eliquis  once daily instead of twice daily.  EKG today indicates atrial flutter at 120 bpm, patient only endorses dyspnea on exertion that he reports is stable and overall not bothersome.  He denies any chest pain, lower extremity edema, orthopnea or PND.  His blood pressure is slightly soft, discussed referral to the emergency room for evaluation, patient deferred.  Discussed with Dr. Anner, DOD at [facility] office today, he recommended reloading patient with amiodarone  200 mg twice daily for  the next 2 weeks then 200 mg daily, discontinuing metoprolol  as likely is contributing to decreased blood pressure without improved rate control and restarting Eliquis  and plan for TEE with cardioversion next week.  Patient in agreement with this plan and reports that he will be adherent with medications, stressed with patient the importance of taking his Eliquis  as prescribed, patient verbalized his understanding.  Stop metoprolol .  Start Eliquis  5 mg twice daily and amiodarone  200 mg twice daily for 2 weeks then amiodarone  200 mg daily.  Patient had a [facility] work completed [facility]10[facility], please see results in care everywhere.  Reviewed ED precautions extensively with patient, he verbalized understanding. Informed Consent   Shared Decision Making/Informed Consent   The risks [stroke, cardiac arrhythmias rarely resulting in the need for a temporary or permanent pacemaker, skin irritation or burns, esophageal damage, perforation (1:10,000 risk), bleeding, pharyngeal hematoma as well as other potential complications associated with conscious sedation including aspiration, arrhythmia, respiratory failure and death], benefits (treatment guidance, restoration of normal sinus rhythm, diagnostic support) and alternatives of a transesophageal echocardiogram guided cardioversion were discussed in detail with Mr. Degrace and he is willing to proceed.     ESRD: Dialysis on  Tuesday, Thursday and Saturdays.  Patient reports that he has been adherent with dialysis and has not been cutting session short.  He reports that he is currently working with [facility] for consideration of a transplant.  CAD: History of non-STEMI in 2022 with multivessel disease on cardiac catheterization.  S/p CABG in August 2023.  Post bypass course was complicated with 72-month hospitalization between August and October 2023 notable for cardiogenic shock, progression of end-stage renal disease, pericardial effusion and pleural effusion.  Patient denies any chest pain, notes mild dyspnea on exertion likely related to ongoing atrial flutter.  Reviewed ED precautions.  Hyperlipidemia: Last lipid profile on 08/10/2023 indicated total cholesterol 97, triglycerides 91, HDL 40 and LDL 40.  Continue Lipitor  80 mg daily.   Disposition: F/u with Noe Pittsley, NP in 2-3 weeks.   Signed, Gerome Kokesh D Tajuan Dufault, NP

## 2023-08-10 NOTE — H&P (View-Only) (Signed)
 Cardiology Office Note    Date:  08/12/2023  ID:  [name], DOB 11/16/1960, MRN [id number] PCP:  Tanda Bleacher, MD  Cardiologist:  Lurena MARLA Red, MD  Electrophysiologist:  None   Chief Complaint: Tachycardia   History of Present Illness: .    [name] is a 63 y.o. male with visit-pertinent history of ESRD on HD, CAD s/p CABG in 09/2021, paroxysmal atrial fibs/flutter, type II DM, history of CVA in 12/2020, history of PEA arrest in setting of cardiac tamponade in 09/2021 s/p pericardial window, hypertension, hyperlipidemia, alcohol  abuse, tobacco use.  Patient with extensive history of CAD and previously had stents placed to the LAD, high marginal and distal circumflex.  Patient underwent CABG x 5 on 09/12/2021 with LIMA to LAD, SVG diagonal, SVG to OM1-OM2, SVG to dRCA.  He did have postoperative atrial fibrillation and he was treated with carvedilol  and Eliquis .  On 09/28/2021 he presented to the hospital complaining of lethargy and dyspnea on exertion.  Echo on 09/28/2021 showed EF 50%, grade 2 diastolic dysfunction, moderate left-sided pericardial effusion with evidence of possible tamponade.  He underwent surgical evacuation of pericardial hematoma on 8/19.  After the procedure patient had recurrent atrial fibrillation and was treated with IV amiodarone .  He developed bradycardia and PEA arrest.  Patient was intubated and bedside echocardiogram showed a compressed LV with large posterior effusion/clot.  He was intubated and arrested a second time.  Dr. Shyrl performed emergent bedside sternotomy with removal of a large amount of clot, later that day patient had a third recurrent cardiac arrest.  The chest was unpacked, pericardium was suctioned and Dr. Cherrie performed cardiac massage.  Patient was taken emergently to the OR for washout and VA ECMO support.  Again required over washout on 09/30/2021.  On 10/01/2021 patient developed A-fib and was treated with IV amiodarone .  Repeat  washout on 10/03/2021 and had an Impella placed on 8/28, ECMO was discontinued.  On 8/29 he had a PEA arrest and later had elevated heart rate and atrial flutter despite multiple boluses of amiodarone .  He underwent DCCV with conversion to normal sinus rhythm.  Had chest close on 9/2 and Impella was extracted on 9/7.  He had a percutaneous tracheostomy placed on 9/7 as well.  Patient was transmitted to [facility] on 9/21 and had a tunneled HD cath placed on 9/25.  He was eventually discharged on 12/03/2021.  Limited echo on 10/23/2021 showed LVEF 65 to 70%, no RWMA, G1 DD, mildly reduced RV systolic function.  On 05/15/2022 patient presented to ED from dialysis with tachycardia.  Per EMS the dialysis center noted patient's heart rate had jumped to the 140s.  Patient complained of intermittent chest pain and shortness of breath.  In the ED EKG showed atrial flutter with heart rate 143 bpm.  Labs showed NA 135, K4.4, creatinine 7.4.  Patient converted to normal sinus rhythm with heart rate in the 100s after receiving p.o. metoprolol .  Patient was last seen in clinic on 01/14/2023 by [name], PA.  Patient reported exertional dyspnea over the prior few months, he noted difficulty walking from the car to the clinic due to breathlessness, necessitating rest.  He denied any chest discomfort or pain, orthopnea or lower extremity edema.  Patient acknowledged suboptimal adherence to his dialysis regimen, often leaving before recommended 4-hour session was completed.  Patient's ECG showed persistent inferior lateral and anterolateral T wave inversions, it was recommended that patient have Lexiscan Myoview to rule out  graft failure, was not completed.  Patient was instructed to follow-up in 4 to 6 weeks, was lost to follow-up.  Today he presents regarding increased heart rate, patient reports that for the last several weeks his heart rate has consistently been in the 120's when he presents to dialysis. He denies chest pain,  endorses increased dyspnea on exertion. Patient reports he was previously told at dialysis to hold his metoprolol  and amiodarone  as his blood pressure was low, notes this was months ago. He reports he has not been taking his medications consistently until the last two weeks when he noticed his heart rate would increase into the 130's. He denies increased lower extremity edema, orthopnea or pnd, reports adherence with dialysis on Tuesdays, Thursdays and Saturdays. He notes he has only been taking his Eliquis  once daily for the last two weeks, was intermittently prior to this.  He reports that he has been taking his metoprolol  and amiodarone  regularly for the last 2 weeks when he noticed his heart rate increasing more.  Labwork independently reviewed: 08/10/2023: Hemoglobin 11.7, hematocrit 35.5, sodium 142, potassium 3.6, creatinine 8.94, magnesium  2.1 ROS: .   Today he denies chest pain, lower extremity edema, fatigue, palpitations, melena, hematuria, hemoptysis, diaphoresis, weakness, presyncope, syncope, orthopnea, and PND.  All other systems are reviewed and otherwise negative. Studies Reviewed: SABRA   EKG:  EKG is ordered today, personally reviewed, demonstrating  EKG Interpretation Date/Time:  Wednesday August 12 2023 15:06:55 EDT Ventricular Rate:  120 PR Interval:  132 QRS Duration:  98 QT Interval:  350 QTC Calculation: 494 R Axis:   80  Text Interpretation: Atrial flutter Nonspecific ST and T wave abnormality Confirmed by Gay Rape [phone]) on 08/12/2023 8:11:40 PM   CV Studies: Cardiac studies reviewed are outlined and summarized above. Otherwise please see EMR for full report. Cardiac Studies & Procedures   ______________________________________________________________________________________________ CARDIAC CATHETERIZATION  CARDIAC CATHETERIZATION 12/26/2020  Conclusion   Prox Cx lesion is 60% stenosed.   Prox Cx to Mid Cx lesion is 90% stenosed.   1st Mrg-2 lesion is 80%  stenosed.   Mid LAD lesion is 50% stenosed.   Mid LAD to Dist LAD lesion is 80% stenosed.   2nd Diag lesion is 80% stenosed.   RPAV lesion is 50% stenosed.   RV Branch-1 lesion is 70% stenosed.   RV Branch-2 lesion is 80% stenosed.   Prox RCA-1 lesion is 90% stenosed.   Prox RCA to Mid RCA lesion is 95% stenosed.   Mid RCA lesion is 20% stenosed.   Prox RCA-2 lesion is 65% stenosed.   Dist RCA lesion is 20% stenosed.   1st Diag lesion is 90% stenosed.   Prox LAD lesion is 30% stenosed.   Previously placed Mid Cx to Dist Cx stent (unknown type) is  widely patent.   Previously placed 1st Mrg-1 stent (unknown type) is  widely patent.   LV end diastolic pressure is severely elevated.  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  In this diabetic male with significant diffuse disease in the LAD, left circumflex, and right coronary artery recommend surgical evaluation for consideration of CABG revascularization.  RECOMMENDATION: Surgical consultation for consideration of CABG.  We will reinitiate heparin  later this evening.  Patient continues to be on intravenous nitroglycerin .  Increase medical therapy as blood pressure and heart rate allow.  Aggressive lipid-lowering therapy with target LDL in the 50s or  below.  Findings Coronary Findings Diagnostic  Dominance: Right  Left Anterior Descending Prox LAD lesion is 30% stenosed. Mid LAD lesion is 50% stenosed. The lesion was previously treated . Mid LAD to Dist LAD lesion is 80% stenosed.  First Diagonal Branch Collaterals 1st Diag filled by collaterals from Dist LAD.  1st Diag lesion is 90% stenosed.  Second Diagonal Branch 2nd Diag lesion is 80% stenosed.  Left Circumflex Prox Cx lesion is 60% stenosed. Prox Cx to Mid Cx lesion is 90% stenosed. Previously placed Mid Cx to Dist Cx stent (unknown type) is  widely  patent.  First Obtuse Marginal Branch Previously placed 1st Mrg-1 stent (unknown type) is  widely patent. 1st Mrg-2 lesion is 80% stenosed.  Right Coronary Artery Prox RCA-1 lesion is 90% stenosed. Prox RCA-2 lesion is 65% stenosed. Prox RCA to Mid RCA lesion is 95% stenosed. Mid RCA lesion is 20% stenosed. Dist RCA lesion is 20% stenosed.  Right Ventricular Branch RV Branch-1 lesion is 70% stenosed. RV Branch-2 lesion is 80% stenosed.  Right Posterior Atrioventricular Artery RPAV lesion is 50% stenosed.  Intervention  No interventions have been documented.   CARDIAC CATHETERIZATION 12/25/2020  Conclusion   Prox RCA lesion is 90% stenosed.   RV Branch-1 lesion is 70% stenosed.   RV Branch-2 lesion is 80% stenosed.   Prox RCA to Mid RCA lesion is 90% stenosed.   Mid RCA lesion is 20% stenosed.   Mid RCA to Dist RCA lesion is 30% stenosed.   2nd RPL lesion is 50% stenosed.  There is severe CAD in a dominant right coronary artery with 90% proximal and mid stenoses, diffuse 70 and 80% stenoses in an RV marginal branch, and mid distal 20 and 30% stenoses.  Due to innominate vessel tortuosity as well as significant radial vasospasm, the left coronary system was unable to be cannulated despite attempts with multiple catheters and additional verapamil , intra-arterial nitroglycerin  and IV nitroglycerin .  Will initiate IV heparin  8 hours post procedure and plan for completion of the diagnostic catheterization to the left coronary system tomorrow via the femoral approach and plan for probable PCI to the RCA depending upon left coronary findings.  Findings Coronary Findings Diagnostic  Dominance: Right  Right Coronary Artery There is mild diffuse disease throughout the vessel. Prox RCA lesion is 90% stenosed. Prox RCA to Mid RCA lesion is 90% stenosed. Mid RCA lesion is 20% stenosed. Mid RCA to Dist RCA lesion is 30% stenosed.  Right Ventricular Branch Vessel is small in  size. RV Branch-1 lesion is 70% stenosed. RV Branch-2 lesion is 80% stenosed.  Second Right Posterolateral Branch 2nd RPL lesion is 50% stenosed.  Intervention  No interventions have been documented.     ECHOCARDIOGRAM  ECHOCARDIOGRAM COMPLETE 05/16/2022  Narrative ECHOCARDIOGRAM REPORT    Patient Name:   BECKAM ABDULAZIZ [facility] Date of Exam: 05/16/2022 Medical Rec #:  [id number]    Height:       67.0 in Accession #:    [id number]   Weight:       175.0 lb Date of Birth:  25-Sep-1960    BSA:          1.911 m Patient Age:    62 years     BP:           133/103 mmHg Patient Gender: M            HR:           88 bpm. Exam Location:  Inpatient  Procedure: 2D Echo, Cardiac Doppler and Color Doppler  Indications:    R07.9* Chest pain, unspecified, Dyspnea  History:        Patient has prior history of Echocardiogram examinations. CAD, Prior CABG, Stroke; Risk Factors:Hypertension, Dyslipidemia and Diabetes.  Sonographer:    Morna Luis Referring Phys: [id number] ROLLO JONELLE LOUDER  IMPRESSIONS   1. Left ventricular ejection fraction, by estimation, is 60 to 65%. The left ventricle has normal function. The left ventricle has no regional wall motion abnormalities. There is mild concentric left ventricular hypertrophy. Left ventricular diastolic parameters were normal. 2. Right ventricular systolic function is normal. The right ventricular size is normal. Tricuspid regurgitation signal is inadequate for assessing PA pressure. 3. The mitral valve is degenerative. No evidence of mitral valve regurgitation. No evidence of mitral stenosis. 4. The aortic valve has an indeterminant number of cusps. There is moderate calcification of the aortic valve. There is moderate thickening of the aortic valve. Aortic valve regurgitation is trivial. Aortic valve sclerosis/calcification is present, without any evidence of aortic stenosis. 5. The inferior vena cava was not well visualized.  FINDINGS Left  Ventricle: Left ventricular ejection fraction, by estimation, is 60 to 65%. The left ventricle has normal function. The left ventricle has no regional wall motion abnormalities. The left ventricular internal cavity size was normal in size. There is mild concentric left ventricular hypertrophy. Left ventricular diastolic parameters were normal. Normal left ventricular filling pressure.  Right Ventricle: The right ventricular size is normal. No increase in right ventricular wall thickness. Right ventricular systolic function is normal. Tricuspid regurgitation signal is inadequate for assessing PA pressure.  Left [facility]: Left atrial size was normal in size.  Right [facility]: Right atrial size was normal in size.  Pericardium: There is no evidence of pericardial effusion.  Mitral Valve: The mitral valve is degenerative in appearance. There is moderate thickening of the mitral valve leaflet(s). There is moderate calcification of the mitral valve leaflet(s). No evidence of mitral valve regurgitation. No evidence of mitral valve stenosis.  Tricuspid Valve: The tricuspid valve is normal in structure. Tricuspid valve regurgitation is not demonstrated. No evidence of tricuspid stenosis.  Aortic Valve: The aortic valve has an indeterminant number of cusps. There is moderate calcification of the aortic valve. There is moderate thickening of the aortic valve. Aortic valve regurgitation is trivial. Aortic regurgitation PHT measures 277 msec. Aortic valve sclerosis/calcification is present, without any evidence of aortic stenosis.  Pulmonic Valve: The pulmonic valve was normal in structure. Pulmonic valve regurgitation is not visualized. No evidence of pulmonic stenosis.  Aorta: The aortic root is normal in size and structure.  Venous: The inferior vena cava was not well visualized.  IAS/Shunts: No atrial level shunt detected by color flow Doppler.   LEFT VENTRICLE PLAX 2D LVIDd:         4.10 cm       Diastology LVIDs:         2.50 cm      LV e' medial:    9.25 cm/s LV PW:         1.20 cm      LV E/e' medial:  7.3 LV IVS:        1.20 cm      LV e' lateral:   10.80 cm/s LVOT diam:     1.90 cm      LV E/e' lateral: 6.2 LV SV:         37 LV SV Index:   19 LVOT Area:  2.84 cm  LV Volumes (MOD) LV vol d, MOD A2C: 108.0 ml LV vol d, MOD A4C: 99.9 ml LV vol s, MOD A2C: 39.6 ml LV vol s, MOD A4C: 38.4 ml LV SV MOD A2C:     68.4 ml LV SV MOD A4C:     99.9 ml LV SV MOD BP:      66.2 ml  RIGHT VENTRICLE RV Basal diam:  3.30 cm RV S prime:     8.50 cm/s TAPSE (M-mode): 1.1 cm  LEFT ATRIUM             Index        RIGHT ATRIUM           Index LA diam:        3.50 cm 1.83 cm/m   RA Area:     15.90 cm LA Vol (A2C):   41.1 ml 21.51 ml/m  RA Volume:   41.90 ml  21.93 ml/m LA Vol (A4C):   38.5 ml 20.15 ml/m LA Biplane Vol: 39.8 ml 20.83 ml/m AORTIC VALVE LVOT Vmax:   65.30 cm/s LVOT Vmean:  43.300 cm/s LVOT VTI:    0.129 m AI PHT:      277 msec  AORTA Ao Root diam: 2.90 cm  MITRAL VALVE MV Area (PHT): 4.49 cm    SHUNTS MV Decel Time: 169 msec    Systemic VTI:  0.13 m MV E velocity: 67.30 cm/s  Systemic Diam: 1.90 cm MV A velocity: 50.60 cm/s MV E/A ratio:  1.33  Wilbert Bihari MD Electronically signed by Wilbert Bihari MD Signature Date/Time: 05/16/2022/2:09:37 PM    Final   TEE  ECHO INTRAOPERATIVE TEE 10/12/2021  Narrative *INTRAOPERATIVE TRANSESOPHAGEAL REPORT *    Patient Name:   LARRI YEHLE [facility]   Date of Exam: 10/12/2021 Medical Rec #:  [id number]      Height:       67.0 in Accession #:    [id number]     Weight:       235.0 lb Date of Birth:  12/14/1960      BSA:          2.17 m Patient Age:    61 years       BP:           122/56 mmHg Patient Gender: M              HR:           63 bpm. Exam Location:  Anesthesiology  Transesophogeal exam was perform intraoperatively during surgical procedure. Patient was closely monitored under general anesthesia during the  entirety of examination.  Indications:     Open Chest Performing Phys: [id number] BRYAN K BARTLE  Complications: No known complications during this procedure. PRE-OP  FINDINGS Left Ventricle: The left ventricle has mildly reduced systolic function, with an ejection fraction of 45-50%. The cavity size was normal. There is mildly increased left ventricular wall thickness. Pt on epinephrine  drip.   Right Ventricle: The right ventricle has moderately reduced systolic function. The cavity was normal. There is no increase in right ventricular wall thickness.  Left [facility]: Left atrial size was not assessed. No left atrial/left atrial appendage thrombus was detected.  Right [facility]: Right atrial size was not assessed.  Interatrial Septum: No atrial level shunt detected by color flow Doppler.  Pericardium: There is no evidence of pericardial effusion.  Mitral Valve: The mitral valve is normal in structure. Mitral valve regurgitation is trivial by color flow Doppler.  Tricuspid Valve: The tricuspid valve was normal in structure. Tricuspid valve regurgitation was not visualized by color flow Doppler.  Aortic Valve: The aortic valve is normal in structure. Aortic valve regurgitation is mild by color flow Doppler. Impella in good position across the AV.  Pulmonic Valve: The pulmonic valve was normal in structure. Pulmonic valve regurgitation is not visualized by color flow Doppler.    Elsie Needle MD Electronically signed by Elsie Needle MD Signature Date/Time: 10/12/2021/11:10:32 AM   Final        ______________________________________________________________________________________________       Current Reported Medications:.    Current Meds  Medication Sig   amiodarone  (PACERONE ) 200 MG tablet Take 1 tablet (200 mg total) by mouth daily.   apixaban  (ELIQUIS ) 5 MG TABS tablet Take 1 tablet (5 mg total) by mouth 2 (two) times daily.   atorvastatin  (LIPITOR ) 80 MG tablet  Take 1 tablet (80 mg total) by mouth daily.   Cinacalcet HCl (SENSIPAR PO) Take 60 mg by mouth.   cycloSPORINE  (RESTASIS ) 0.05 % ophthalmic emulsion Place 1 drop into both eyes 2 (two) times daily.   leptospermum manuka honey (MEDIHONEY) PSTE paste Apply topically.   LOPERAMIDE  HCL PO Take 2 mg by mouth.   loteprednol  (LOTEMAX ) 0.5 % ophthalmic suspension Place 1 drop into both eyes daily.   Methoxy PEG-Epoetin Beta (MIRCERA IJ) Mircera   Methoxy PEG-Epoetin Beta (MIRCERA IJ) 120 mcg.   Methoxy PEG-Epoetin Beta (MIRCERA IJ) Mircera   Methoxy PEG-Epoetin Beta (MIRCERA IJ) 75 mcg.   multivitamin (RENA-VIT) TABS tablet Take 1 tablet by mouth.   sevelamer  carbonate (RENVELA ) 800 MG tablet Take 1,600 mg by mouth.   [DISCONTINUED] metoprolol  succinate (TOPROL -XL) 25 MG 24 hr tablet Take 1 tablet (25 mg total) by mouth daily.   Physical Exam:    VS:  BP 104/68   Pulse (!) 120   Ht 5' 7 (1.702 m)   Wt 219 lb 3.2 oz (99.4 kg)   SpO2 94%   BMI 34.33 kg/m    Wt Readings from Last 3 Encounters:  08/12/23 219 lb 3.2 oz (99.4 kg)  04/29/23 218 lb 6.4 oz (99.1 kg)  01/14/23 203 lb 3.2 oz (92.2 kg)    GEN: Well nourished, well developed in no acute distress NECK: No JVD; No carotid bruits CARDIAC: tachycardia, regular rhythm, no murmurs, rubs, gallops RESPIRATORY:  Clear to auscultation without rales, wheezing or rhonchi  ABDOMEN: Soft, non-tender, non-distended EXTREMITIES:  No edema; No acute deformity     Asessement and Plan:.    Persistent atrial fibrillation/flutter: Patient with history of paroxysmal atrial fibrillation/flutter.  Patient reports that for the last few weeks/months his heart rate has been sustaining around 120, the last 2 weeks he notes that it was increasing regularly into the 130s when he was at dialysis.  Patient reports that he had been nonadherent with his Eliquis , amiodarone  and metoprolol  in recent months.  He reports that 2 weeks ago he started taking his amiodarone   and metoprolol  regularly, was only taking Eliquis  once daily instead of twice daily.  EKG today indicates atrial flutter at 120 bpm, patient only endorses dyspnea on exertion that he reports is stable and overall not bothersome.  He denies any chest pain, lower extremity edema, orthopnea or PND.  His blood pressure is slightly soft, discussed referral to the emergency room for evaluation, patient deferred.  Discussed with Dr. Anner, DOD at [facility] office today, he recommended reloading patient with amiodarone  200 mg twice daily for  the next 2 weeks then 200 mg daily, discontinuing metoprolol  as likely is contributing to decreased blood pressure without improved rate control and restarting Eliquis  and plan for TEE with cardioversion next week.  Patient in agreement with this plan and reports that he will be adherent with medications, stressed with patient the importance of taking his Eliquis  as prescribed, patient verbalized his understanding.  Stop metoprolol .  Start Eliquis  5 mg twice daily and amiodarone  200 mg twice daily for 2 weeks then amiodarone  200 mg daily.  Patient had a lab work completed on 08/10/2023 [facility], please see results in care everywhere.  Reviewed ED precautions extensively with patient, he verbalized understanding. Informed Consent   Shared Decision Making/Informed Consent   The risks [stroke, cardiac arrhythmias rarely resulting in the need for a temporary or permanent pacemaker, skin irritation or burns, esophageal damage, perforation (1:10,000 risk), bleeding, pharyngeal hematoma as well as other potential complications associated with conscious sedation including aspiration, arrhythmia, respiratory failure and death], benefits (treatment guidance, restoration of normal sinus rhythm, diagnostic support) and alternatives of a transesophageal echocardiogram guided cardioversion were discussed in detail with Mr. Remer and he is willing to proceed.     ESRD: Dialysis on  Tuesday, Thursday and Saturdays.  Patient reports that he has been adherent with dialysis and has not been cutting session short.  He reports that he is currently working with [facility] for consideration of a transplant.  CAD: History of non-STEMI in 2022 with multivessel disease on cardiac catheterization.  S/p CABG in August 2023.  Post bypass course was complicated with 59-month hospitalization between August and October 2023 notable for cardiogenic shock, progression of end-stage renal disease, pericardial effusion and pleural effusion.  Patient denies any chest pain, notes mild dyspnea on exertion likely related to ongoing atrial flutter.  Reviewed ED precautions.  Hyperlipidemia: Last lipid profile on 08/10/2023 indicated total cholesterol 97, triglycerides 91, HDL 40 and LDL 40.  Continue Lipitor  80 mg daily.   Disposition: F/u with Jeslynn Hollander, NP in 2-3 weeks.   Signed, Dragan Tamburrino D Karol Liendo, NP

## 2023-08-11 DIAGNOSIS — E876 Hypokalemia: Secondary | ICD-10-CM | POA: Diagnosis not present

## 2023-08-11 DIAGNOSIS — Z992 Dependence on renal dialysis: Secondary | ICD-10-CM | POA: Diagnosis not present

## 2023-08-11 DIAGNOSIS — E1122 Type 2 diabetes mellitus with diabetic chronic kidney disease: Secondary | ICD-10-CM | POA: Diagnosis not present

## 2023-08-11 DIAGNOSIS — E8770 Fluid overload, unspecified: Secondary | ICD-10-CM | POA: Diagnosis not present

## 2023-08-11 DIAGNOSIS — D631 Anemia in chronic kidney disease: Secondary | ICD-10-CM | POA: Diagnosis not present

## 2023-08-11 DIAGNOSIS — N186 End stage renal disease: Secondary | ICD-10-CM | POA: Diagnosis not present

## 2023-08-11 DIAGNOSIS — N2581 Secondary hyperparathyroidism of renal origin: Secondary | ICD-10-CM | POA: Diagnosis not present

## 2023-08-12 ENCOUNTER — Encounter: Payer: Self-pay | Admitting: Cardiology

## 2023-08-12 ENCOUNTER — Ambulatory Visit: Attending: Cardiology | Admitting: Cardiology

## 2023-08-12 VITALS — BP 104/68 | HR 120 | Ht 67.0 in | Wt 219.2 lb

## 2023-08-12 DIAGNOSIS — I4819 Other persistent atrial fibrillation: Secondary | ICD-10-CM | POA: Diagnosis not present

## 2023-08-12 DIAGNOSIS — I2510 Atherosclerotic heart disease of native coronary artery without angina pectoris: Secondary | ICD-10-CM

## 2023-08-12 DIAGNOSIS — I4892 Unspecified atrial flutter: Secondary | ICD-10-CM | POA: Diagnosis not present

## 2023-08-12 DIAGNOSIS — I480 Paroxysmal atrial fibrillation: Secondary | ICD-10-CM

## 2023-08-12 DIAGNOSIS — N186 End stage renal disease: Secondary | ICD-10-CM

## 2023-08-12 DIAGNOSIS — E782 Mixed hyperlipidemia: Secondary | ICD-10-CM

## 2023-08-12 NOTE — Patient Instructions (Addendum)
 Medication Instructions:  Stop Metoprolol  Take Amiodarone  200 mg twice a day for the next 2 weeks then go back down to once daily *If you need a refill on your cardiac medications before your next appointment, please call your pharmacy*  Lab Work: None ordered today  If you have labs (blood work) drawn today and your tests are completely normal, you will receive your results only by: MyChart Message (if you have MyChart) OR A paper copy in the mail If you have any lab test that is abnormal or we need to change your treatment, we will call you to review the results.  Testing/Procedures: Your physician has requested that you have a TEE/Cardioversion. During a TEE, sound waves are used to create images of your heart. It provides your doctor with information about the size and shape of your heart and how well your heart's chambers and valves are working. In this test, a transducer is attached to the end of a flexible tube that is guided down you throat and into your esophagus (the tube leading from your mouth to your stomach) to get a more detailed image of your heart. Once the TEE has determined that a blood clot is not present, the cardioversion begins. Electrical Cardioversion uses a jolt of electricity to your heart either through paddles or wired patches attached to your chest. This is a controlled, usually prescheduled, procedure. This procedure is done at the hospital and you are not awake during the procedure. You usually go home the day of the procedure. SEE INSTRUCTION BELOW:      Dear [name]  You are scheduled for a TEE (Transesophageal Echocardiogram) Guided Cardioversion on Monday, July 7 with Dr. MONA.  Please arrive at the [facility] (Main Entrance A) at [facility]: [address] [place], [place] [address] at 11:30 AM (This time is 1.5 hour(s) before your procedure to ensure your preparation).   Free valet parking service is available. You will check in at ADMITTING.    *Please Note: You will receive a call the day before your procedure to confirm the appointment time. That time may have changed from the original time based on the schedule for that day.*    DIET:  Nothing to eat or drink after midnight except a sip of water  with medications (see medication instructions below)  MEDICATION INSTRUCTIONS: !!IF ANY NEW MEDICATIONS ARE STARTED AFTER TODAY, PLEASE NOTIFY YOUR PROVIDER AS SOON AS POSSIBLE!!  FYI: Medications such as Semaglutide  (Ozempic , Wegovy ), Tirzepatide (Mounjaro, Zepbound), Dulaglutide  (Trulicity ), etc (GLP1 agonists) AND Canagliflozin (Invokana), Dapagliflozin (Farxiga), Empagliflozin (Jardiance), Ertugliflozin (Steglatro), Bexagliflozin [employer]) or any combination with one of these drugs such as Invokamet (Canagliflozin/Metformin), Synjardy (Empagliflozin/Metformin), etc (SGLT2 inhibitors) must be held around the time of a procedure. This is not a comprehensive list of all of these drugs. Please review all of your medications and talk to your provider if you take any one of these. If you are not sure, ask your provider.     Continue taking your anticoagulant (blood thinner): Apixaban  (Eliquis ).  You will need to continue this after your procedure until you are told by your provider that it is safe to stop.    LABS:     FYI:  For your safety, and to allow us  to monitor your vital signs accurately during the surgery/procedure we request: If you have artificial nails, gel coating, SNS etc, please have those removed prior to your surgery/procedure. Not having the nail coverings /polish removed may result in  cancellation or delay of your surgery/procedure.  Your support person will be asked to wait in the waiting room during your procedure.  It is OK to have someone drop you off and come back when you are ready to be discharged.  You cannot drive after the procedure and will need someone to drive you home.  Bring your insurance  cards.  *Special Note: Every effort is made to have your procedure done on time. Occasionally there are emergencies that occur at the hospital that may cause delays. Please be patient if a delay does occur.      Follow-Up: At [facility] you and your health needs are our priority.  As part of our continuing mission to provide you with exceptional heart care, our providers are all part of one team.  This team includes your primary Cardiologist (physician) and Advanced Practice Providers or APPs (Physician Assistants and Nurse Practitioners) who all work together to provide you with the care you need, when you need it.  Your next appointment:   2-3 week(s)  Provider:   Katlyn West, NP  We recommend signing up for the patient portal called MyChart.  Sign up information is provided on this After Visit Summary.  MyChart is used to connect with patients for Virtual Visits (Telemedicine).  Patients are able to view lab/test results, encounter notes, upcoming appointments, etc.  Non-urgent messages can be sent to your provider as well.   To learn more about what you can do with MyChart, go to [email or web address].   Other Instructions

## 2023-08-13 DIAGNOSIS — Z992 Dependence on renal dialysis: Secondary | ICD-10-CM | POA: Diagnosis not present

## 2023-08-13 DIAGNOSIS — N186 End stage renal disease: Secondary | ICD-10-CM | POA: Diagnosis not present

## 2023-08-13 DIAGNOSIS — E876 Hypokalemia: Secondary | ICD-10-CM | POA: Diagnosis not present

## 2023-08-13 DIAGNOSIS — E8770 Fluid overload, unspecified: Secondary | ICD-10-CM | POA: Diagnosis not present

## 2023-08-13 DIAGNOSIS — N2581 Secondary hyperparathyroidism of renal origin: Secondary | ICD-10-CM | POA: Diagnosis not present

## 2023-08-13 DIAGNOSIS — D631 Anemia in chronic kidney disease: Secondary | ICD-10-CM | POA: Diagnosis not present

## 2023-08-13 DIAGNOSIS — E1122 Type 2 diabetes mellitus with diabetic chronic kidney disease: Secondary | ICD-10-CM | POA: Diagnosis not present

## 2023-08-13 NOTE — Progress Notes (Signed)
 Pt called for pre procedure instructions.  Gave detailed instructions to wife.   Arrival time 1215 NPO after midnight explained Instructed to take am meds with sip of water .   Instructed pt need for ride home tomorrow and have responsible adult with them for 24 hrs post procedure.

## 2023-08-13 NOTE — Telephone Encounter (Signed)
 Called patient and he is aware of providers note, he was at dialysis and I told him to give us  a call if he wants us  to refer him out or anything else he needs

## 2023-08-15 DIAGNOSIS — N2581 Secondary hyperparathyroidism of renal origin: Secondary | ICD-10-CM | POA: Diagnosis not present

## 2023-08-15 DIAGNOSIS — Z992 Dependence on renal dialysis: Secondary | ICD-10-CM | POA: Diagnosis not present

## 2023-08-15 DIAGNOSIS — E876 Hypokalemia: Secondary | ICD-10-CM | POA: Diagnosis not present

## 2023-08-15 DIAGNOSIS — N186 End stage renal disease: Secondary | ICD-10-CM | POA: Diagnosis not present

## 2023-08-15 DIAGNOSIS — E8770 Fluid overload, unspecified: Secondary | ICD-10-CM | POA: Diagnosis not present

## 2023-08-15 DIAGNOSIS — D631 Anemia in chronic kidney disease: Secondary | ICD-10-CM | POA: Diagnosis not present

## 2023-08-15 DIAGNOSIS — E1122 Type 2 diabetes mellitus with diabetic chronic kidney disease: Secondary | ICD-10-CM | POA: Diagnosis not present

## 2023-08-17 ENCOUNTER — Ambulatory Visit (HOSPITAL_COMMUNITY)
Admission: RE | Admit: 2023-08-17 | Discharge: 2023-08-17 | Disposition: A | Discharge status: Home / Self Care | Source: Ambulatory Visit | Attending: Cardiology | Admitting: Internal Medicine

## 2023-08-17 ENCOUNTER — Ambulatory Visit (HOSPITAL_COMMUNITY)
Admission: RE | Admit: 2023-08-17 | Discharge: 2023-08-17 | Disposition: A | Discharge status: Home / Self Care | Source: Ambulatory Visit | Attending: Internal Medicine | Admitting: Internal Medicine

## 2023-08-17 ENCOUNTER — Other Ambulatory Visit: Payer: Self-pay

## 2023-08-17 ENCOUNTER — Encounter (HOSPITAL_COMMUNITY)
Admission: RE | Disposition: A | Discharge status: Home / Self Care | Payer: Self-pay | Source: Ambulatory Visit | Attending: Internal Medicine

## 2023-08-17 ENCOUNTER — Ambulatory Visit (HOSPITAL_COMMUNITY)

## 2023-08-17 DIAGNOSIS — Z955 Presence of coronary angioplasty implant and graft: Secondary | ICD-10-CM | POA: Diagnosis not present

## 2023-08-17 DIAGNOSIS — I340 Nonrheumatic mitral (valve) insufficiency: Secondary | ICD-10-CM | POA: Diagnosis not present

## 2023-08-17 DIAGNOSIS — Z992 Dependence on renal dialysis: Secondary | ICD-10-CM | POA: Insufficient documentation

## 2023-08-17 DIAGNOSIS — E1122 Type 2 diabetes mellitus with diabetic chronic kidney disease: Secondary | ICD-10-CM | POA: Diagnosis not present

## 2023-08-17 DIAGNOSIS — Z8673 Personal history of transient ischemic attack (TIA), and cerebral infarction without residual deficits: Secondary | ICD-10-CM | POA: Diagnosis not present

## 2023-08-17 DIAGNOSIS — Z951 Presence of aortocoronary bypass graft: Secondary | ICD-10-CM | POA: Insufficient documentation

## 2023-08-17 DIAGNOSIS — I4892 Unspecified atrial flutter: Secondary | ICD-10-CM | POA: Diagnosis not present

## 2023-08-17 DIAGNOSIS — N186 End stage renal disease: Secondary | ICD-10-CM | POA: Diagnosis not present

## 2023-08-17 DIAGNOSIS — K219 Gastro-esophageal reflux disease without esophagitis: Secondary | ICD-10-CM | POA: Diagnosis not present

## 2023-08-17 DIAGNOSIS — I4819 Other persistent atrial fibrillation: Secondary | ICD-10-CM

## 2023-08-17 DIAGNOSIS — Z87891 Personal history of nicotine dependence: Secondary | ICD-10-CM | POA: Insufficient documentation

## 2023-08-17 DIAGNOSIS — I2510 Atherosclerotic heart disease of native coronary artery without angina pectoris: Secondary | ICD-10-CM

## 2023-08-17 DIAGNOSIS — E785 Hyperlipidemia, unspecified: Secondary | ICD-10-CM | POA: Diagnosis not present

## 2023-08-17 DIAGNOSIS — Z79899 Other long term (current) drug therapy: Secondary | ICD-10-CM | POA: Insufficient documentation

## 2023-08-17 DIAGNOSIS — Z7901 Long term (current) use of anticoagulants: Secondary | ICD-10-CM | POA: Diagnosis not present

## 2023-08-17 DIAGNOSIS — I120 Hypertensive chronic kidney disease with stage 5 chronic kidney disease or end stage renal disease: Secondary | ICD-10-CM

## 2023-08-17 DIAGNOSIS — Z8674 Personal history of sudden cardiac arrest: Secondary | ICD-10-CM | POA: Insufficient documentation

## 2023-08-17 DIAGNOSIS — I252 Old myocardial infarction: Secondary | ICD-10-CM | POA: Diagnosis not present

## 2023-08-17 LAB — POCT I-STAT, CHEM 8
BUN: 42 mg/dL — ABNORMAL HIGH (ref 8–23)
Calcium, Ion: 0.96 mmol/L — ABNORMAL LOW (ref 1.15–1.40)
Chloride: 95 mmol/L — ABNORMAL LOW (ref 98–111)
Creatinine, Ser: 9.2 mg/dL — ABNORMAL HIGH (ref 0.61–1.24)
Glucose, Bld: 111 mg/dL — ABNORMAL HIGH (ref 70–99)
HCT: 35 % — ABNORMAL LOW (ref 39.0–52.0)
Hemoglobin: 11.9 g/dL — ABNORMAL LOW (ref 13.0–17.0)
Potassium: 3.5 mmol/L (ref 3.5–5.1)
Sodium: 135 mmol/L (ref 135–145)
TCO2: 26 mmol/L (ref 22–32)

## 2023-08-17 LAB — ECHO TEE

## 2023-08-17 SURGERY — TRANSESOPHAGEAL ECHOCARDIOGRAM (TEE) (CATHLAB)
Anesthesia: General

## 2023-08-17 MED ORDER — PHENYLEPHRINE HCL (PRESSORS) 10 MG/ML IV SOLN
INTRAVENOUS | Status: DC | PRN
  Administered 2023-08-17 (×5): 160 ug via INTRAVENOUS

## 2023-08-17 MED ORDER — PROPOFOL 10 MG/ML IV BOLUS
INTRAVENOUS | Status: DC | PRN
  Administered 2023-08-17: 30 mg via INTRAVENOUS

## 2023-08-17 MED ORDER — SODIUM CHLORIDE 0.9% FLUSH: 3.0000 mL | INTRAVENOUS | Status: DC | PRN

## 2023-08-17 MED ORDER — LACTATED RINGERS IV SOLN: INTRAVENOUS | Status: DC | PRN

## 2023-08-17 MED ORDER — SODIUM CHLORIDE 0.9% FLUSH: 3.0000 mL | Freq: Two times a day (BID) | INTRAVENOUS | Status: DC

## 2023-08-17 MED ORDER — PROPOFOL 500 MG/50ML IV EMUL
INTRAVENOUS | Status: DC | PRN
  Administered 2023-08-17: 150 ug/kg/min via INTRAVENOUS

## 2023-08-17 MED ORDER — LIDOCAINE 2% (20 MG/ML) 5 ML SYRINGE
INTRAMUSCULAR | Status: DC | PRN
  Administered 2023-08-17: 100 mg via INTRAVENOUS

## 2023-08-17 SURGICAL SUPPLY — 1 items: PAD DEFIB RADIO PHYSIO CONN (PAD) ×1 IMPLANT

## 2023-08-17 NOTE — Interval H&P Note (Signed)
 History and Physical Interval Note:  08/17/2023 2:11 PM  [name]  has presented today for surgery, with the diagnosis of afib.  The various methods of treatment have been discussed with the patient and family. After consideration of risks, benefits and other options for treatment, the patient has consented to  Procedure(s): TRANSESOPHAGEAL ECHOCARDIOGRAM (N/A) CARDIOVERSION (N/A) as a surgical intervention.  The patient's history has been reviewed, patient examined, no change in status, stable for surgery.  I have reviewed the patient's chart and labs.  Questions were answered to the patient's satisfaction.     [name]

## 2023-08-17 NOTE — Discharge Instructions (Signed)

## 2023-08-17 NOTE — CV Procedure (Signed)
 TEE/CARDIOVERSION NOTE  TRANSESOPHAGEAL ECHOCARDIOGRAM (TEE):  Indictation: Atrial Flutter  Consent:   Informed consent was obtained prior to the procedure. The risks, benefits and alternatives for the procedure were discussed and the patient comprehended these risks.  Risks include, but are not limited to, cough, sore throat, vomiting, nausea, somnolence, esophageal and stomach trauma or perforation, bleeding, low blood pressure, aspiration, pneumonia, infection, trauma to the teeth and death.    Time Out: Verified patient identification, verified procedure, site/side was marked, verified correct patient position, special equipment/implants available, medications/allergies/relevent history reviewed, required imaging and test results available. Performed  Procedure:  After a procedural time-out, the patient was given propofol  per anesthesia for sedation. See their separate report for details. The patient's heart rate, blood pressure, and oxygen  saturation are monitored continuously during the procedure. The oropharynx was anesthetized with topical cetacaine.  The transesophageal probe was inserted in the esophagus and stomach without difficulty and multiple views were obtained. Agitated microbubble saline contrast was not administered.  Complications:    Complications: None Patient did tolerate procedure well.  Findings:  LEFT VENTRICLE: The left ventricular wall thickness is mildly increased.  The left ventricular cavity is normal in size. Wall motion is normal.  LVEF is 60-65%.  RIGHT VENTRICLE:  The right ventricle is normal size with mildly reduced systolic function without any thrombus or masses.    LEFT ATRIUM:  The left atrium is mildly dilated in size without any thrombus or masses.  There is not spontaneous echo contrast (smoke) in the left atrium consistent with a low flow state.  LEFT ATRIAL APPENDAGE:  The left atrial appendage is free of any thrombus or masses. The  appendage has single lobes. Pulse doppler indicates low flow in the appendage.  ATRIAL SEPTUM:  The atrial septum appears intact and is free of thrombus and/or masses.  There is no evidence for interatrial shunting by color doppler and saline microbubble.  RIGHT ATRIUM:  The right atrium is normal in size and function without any thrombus or masses.  MITRAL VALVE:  The mitral valve leaflets are sclerotic with Mild regurgitation.  There were no vegetations or stenosis.  AORTIC VALVE:  The aortic valve is trileaflet, with sclerotic appearing leaflets and function with no regurgitation.  There were no vegetations or stenosis  TRICUSPID VALVE:  The tricuspid valve is normal in structure and function with trivial regurgitation.  There were no vegetations or stenosis   PULMONIC VALVE:  The pulmonic valve is normal in structure and function with no regurgitation.  There were no vegetations or stenosis.   AORTIC ARCH, ASCENDING AND DESCENDING AORTA:  Not well visualized.  12. PULMONARY VEINS: Anomalous pulmonary venous return was not noted.  13. PERICARDIUM: The pericardium appeared normal and non-thickened.  There is no pericardial effusion.  CARDIOVERSION:     Second Time Out: Verified patient identification, verified procedure, site/side was marked, verified correct patient position, special equipment/implants available, medications/allergies/relevent history reviewed, required imaging and test results available.  Performed  Procedure:  Patient placed on cardiac monitor, pulse oximetry, supplemental oxygen  as necessary.  Sedation administered per anesthesia Pacer pads placed anterior and posterior chest. Cardioverted 1 time(s).  Cardioverted at 200J biphasic.  Complications:  Complications: None Patient did tolerate procedure well.  Impression:  No LAA thrombus Negative for PFO Mild LVH Mild LAE Mildly reduced RV systolic function LVEF 60-65% Successful DCCV with a single 200J  biphasic shock to NSR.  Recommendations:   Continue amiodarone  as directed and anticoagulation with  Eliquis .  Time Spent Directly with the Patient:  45 minutes   Mark KYM Maxcy, MD, [facility], FNLA, FACP  [place]  [facility]  Medical Director of the [facility] Lipid [facility] &  Cardiovascular Risk Reduction Clinic Diplomate of the American Board of Clinical Lipidology Attending Cardiologist  Direct Dial: [phone]  Fax: [phone]  Website:  [email or web address]  Mark BROCKS Johneric Mcfadden 08/17/2023, 2:55 PM

## 2023-08-17 NOTE — Anesthesia Preprocedure Evaluation (Addendum)
 Anesthesia Evaluation  Patient identified by MRN, date of birth, ID band Patient awake    Reviewed: Allergy & Precautions, H&P , NPO status , Patient's Chart, lab work & pertinent test results  History of Anesthesia Complications Negative for: history of anesthetic complications  Airway Mallampati: II  TM Distance: >3 FB Neck ROM: Full    Dental no notable dental hx. (+) Chipped,    Pulmonary former smoker   Pulmonary exam normal breath sounds clear to auscultation       Cardiovascular hypertension, + CAD, + Past MI, + Cardiac Stents and + CABG  + dysrhythmias Atrial Fibrillation  Rhythm:Regular Rate:Normal  Echo 05/2022  1. Left ventricular ejection fraction, by estimation, is 60 to 65%. The left ventricle has normal function. The left ventricle has no regional wall motion abnormalities. There is mild concentric left ventricular hypertrophy. Left ventricular diastolic parameters were normal.   2. Right ventricular systolic function is normal. The right ventricular size is normal. Tricuspid regurgitation signal is inadequate for assessing PA pressure.   3. The mitral valve is degenerative. No evidence of mitral valve regurgitation. No evidence of mitral stenosis.   4. The aortic valve has an indeterminant number of cusps. There is moderate calcification of the aortic valve. There is moderate thickening of the aortic valve. Aortic valve regurgitation is trivial. Aortic valve sclerosis/calcification is present, without any evidence of aortic stenosis.   5. The inferior vena cava was not well visualized.     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, Residual Symptoms  negative psych ROS   GI/Hepatic Neg liver ROS,GERD  ,,  Endo/Other  diabetes, Type 2    Renal/GU Renal Insufficiency, ESRF and DialysisRenal diseaseLast dialysis 7/5  negative genitourinary   Musculoskeletal negative musculoskeletal ROS (+)     Abdominal   Peds negative pediatric ROS (+)  Hematology negative hematology ROS (+)   Anesthesia Other Findings   Reproductive/Obstetrics negative OB ROS                              Anesthesia Physical Anesthesia Plan  ASA: 4  Anesthesia Plan: MAC   Post-op Pain Management: Minimal or no pain anticipated   Induction: Intravenous  PONV Risk Score and Plan: 1 and Treatment may vary due to age or medical condition, Propofol  infusion and TIVA  Airway Management Planned: Nasal Cannula and Natural Airway  Additional Equipment:   Intra-op Plan:   Post-operative Plan:   Informed Consent: I have reviewed the patients History and Physical, chart, labs and discussed the procedure including the risks, benefits and alternatives for the proposed anesthesia with the patient or authorized representative who has indicated his/her understanding and acceptance.     Dental advisory given  Plan Discussed with: CRNA  Anesthesia Plan Comments:          Anesthesia Quick Evaluation

## 2023-08-17 NOTE — Anesthesia Postprocedure Evaluation (Signed)
 Anesthesia Post Note  Patient: [name]  Procedure(s) Performed: TRANSESOPHAGEAL ECHOCARDIOGRAM CARDIOVERSION     Patient location during evaluation: Cath Lab Anesthesia Type: General Level of consciousness: awake and alert Pain management: pain level controlled Vital Signs Assessment: post-procedure vital signs reviewed and stable Respiratory status: spontaneous breathing, nonlabored ventilation, respiratory function stable and patient connected to nasal cannula oxygen  Cardiovascular status: blood pressure returned to baseline and stable Postop Assessment: no apparent nausea or vomiting Anesthetic complications: no   There were no known notable events for this encounter.  Last Vitals:  Vitals:   08/17/23 1524 08/17/23 1525  BP:  110/82  Pulse: 76 76  Resp: (!) 25 16  Temp:    SpO2: 100% 99%    Last Pain:  Vitals:   08/17/23 1510  TempSrc:   PainSc: Asleep                 Mika Griffitts,W. EDMOND

## 2023-08-17 NOTE — Transfer of Care (Signed)
 Immediate Anesthesia Transfer of Care Note  Patient: [name]  Procedure(s) Performed: TRANSESOPHAGEAL ECHOCARDIOGRAM CARDIOVERSION  Patient Location: Cath Lab  Anesthesia Type:MAC  Level of Consciousness: awake and alert   Airway & Oxygen  Therapy: Patient Spontanous Breathing and Patient connected to nasal cannula oxygen   Post-op Assessment: Report given to RN and Post -op Vital signs reviewed and stable  Post vital signs: Reviewed and stable  Last Vitals:  Vitals Value Taken Time  BP 101/80 1510  Temp 36 1510  Pulse 70 1510  Resp 16 1510  SpO2 94 1510    Last Pain:  Vitals:   08/17/23 1430  TempSrc:   PainSc: 0-No pain         Complications: No notable events documented.

## 2023-08-17 NOTE — Progress Notes (Signed)
  Echocardiogram Echocardiogram Transesophageal has been performed.  Koleen KANDICE Popper, RDCS 08/17/2023, 3:04 PM

## 2023-08-18 ENCOUNTER — Encounter (HOSPITAL_COMMUNITY): Payer: Self-pay | Admitting: Internal Medicine

## 2023-08-18 DIAGNOSIS — E876 Hypokalemia: Secondary | ICD-10-CM | POA: Diagnosis not present

## 2023-08-18 DIAGNOSIS — N186 End stage renal disease: Secondary | ICD-10-CM | POA: Diagnosis not present

## 2023-08-18 DIAGNOSIS — Z992 Dependence on renal dialysis: Secondary | ICD-10-CM | POA: Diagnosis not present

## 2023-08-18 DIAGNOSIS — E1122 Type 2 diabetes mellitus with diabetic chronic kidney disease: Secondary | ICD-10-CM | POA: Diagnosis not present

## 2023-08-18 DIAGNOSIS — D631 Anemia in chronic kidney disease: Secondary | ICD-10-CM | POA: Diagnosis not present

## 2023-08-18 DIAGNOSIS — E8770 Fluid overload, unspecified: Secondary | ICD-10-CM | POA: Diagnosis not present

## 2023-08-18 DIAGNOSIS — N2581 Secondary hyperparathyroidism of renal origin: Secondary | ICD-10-CM | POA: Diagnosis not present

## 2023-08-19 ENCOUNTER — Other Ambulatory Visit (HOSPITAL_COMMUNITY): Payer: Self-pay

## 2023-08-19 ENCOUNTER — Other Ambulatory Visit: Payer: Self-pay

## 2023-08-19 ENCOUNTER — Telehealth: Payer: Self-pay | Admitting: Family Medicine

## 2023-08-19 NOTE — Telephone Encounter (Signed)
 Patient dropped off document Handicap Placard, to be filled out by provider. Patient requested to send it back via Call Patient to pick up within 7-days to 10 business days. Document is located in providers tray at front office.Please advise at [phone]

## 2023-08-20 DIAGNOSIS — N186 End stage renal disease: Secondary | ICD-10-CM | POA: Diagnosis not present

## 2023-08-20 DIAGNOSIS — E1122 Type 2 diabetes mellitus with diabetic chronic kidney disease: Secondary | ICD-10-CM | POA: Diagnosis not present

## 2023-08-20 DIAGNOSIS — E8770 Fluid overload, unspecified: Secondary | ICD-10-CM | POA: Diagnosis not present

## 2023-08-20 DIAGNOSIS — N2581 Secondary hyperparathyroidism of renal origin: Secondary | ICD-10-CM | POA: Diagnosis not present

## 2023-08-20 DIAGNOSIS — Z992 Dependence on renal dialysis: Secondary | ICD-10-CM | POA: Diagnosis not present

## 2023-08-20 DIAGNOSIS — D631 Anemia in chronic kidney disease: Secondary | ICD-10-CM | POA: Diagnosis not present

## 2023-08-20 DIAGNOSIS — E876 Hypokalemia: Secondary | ICD-10-CM | POA: Diagnosis not present

## 2023-08-20 NOTE — Telephone Encounter (Signed)
 Noted

## 2023-08-22 DIAGNOSIS — E876 Hypokalemia: Secondary | ICD-10-CM | POA: Diagnosis not present

## 2023-08-22 DIAGNOSIS — E1122 Type 2 diabetes mellitus with diabetic chronic kidney disease: Secondary | ICD-10-CM | POA: Diagnosis not present

## 2023-08-22 DIAGNOSIS — Z992 Dependence on renal dialysis: Secondary | ICD-10-CM | POA: Diagnosis not present

## 2023-08-22 DIAGNOSIS — E8770 Fluid overload, unspecified: Secondary | ICD-10-CM | POA: Diagnosis not present

## 2023-08-22 DIAGNOSIS — N2581 Secondary hyperparathyroidism of renal origin: Secondary | ICD-10-CM | POA: Diagnosis not present

## 2023-08-22 DIAGNOSIS — N186 End stage renal disease: Secondary | ICD-10-CM | POA: Diagnosis not present

## 2023-08-22 DIAGNOSIS — D631 Anemia in chronic kidney disease: Secondary | ICD-10-CM | POA: Diagnosis not present

## 2023-08-24 DIAGNOSIS — N186 End stage renal disease: Secondary | ICD-10-CM | POA: Diagnosis not present

## 2023-08-24 DIAGNOSIS — D631 Anemia in chronic kidney disease: Secondary | ICD-10-CM | POA: Diagnosis not present

## 2023-08-24 DIAGNOSIS — E876 Hypokalemia: Secondary | ICD-10-CM | POA: Diagnosis not present

## 2023-08-24 DIAGNOSIS — N2581 Secondary hyperparathyroidism of renal origin: Secondary | ICD-10-CM | POA: Diagnosis not present

## 2023-08-24 DIAGNOSIS — Z992 Dependence on renal dialysis: Secondary | ICD-10-CM | POA: Diagnosis not present

## 2023-08-24 DIAGNOSIS — E8770 Fluid overload, unspecified: Secondary | ICD-10-CM | POA: Diagnosis not present

## 2023-08-24 DIAGNOSIS — E1122 Type 2 diabetes mellitus with diabetic chronic kidney disease: Secondary | ICD-10-CM | POA: Diagnosis not present

## 2023-08-25 DIAGNOSIS — D631 Anemia in chronic kidney disease: Secondary | ICD-10-CM | POA: Diagnosis not present

## 2023-08-25 DIAGNOSIS — E8770 Fluid overload, unspecified: Secondary | ICD-10-CM | POA: Diagnosis not present

## 2023-08-25 DIAGNOSIS — N2581 Secondary hyperparathyroidism of renal origin: Secondary | ICD-10-CM | POA: Diagnosis not present

## 2023-08-25 DIAGNOSIS — Z992 Dependence on renal dialysis: Secondary | ICD-10-CM | POA: Diagnosis not present

## 2023-08-25 DIAGNOSIS — E1122 Type 2 diabetes mellitus with diabetic chronic kidney disease: Secondary | ICD-10-CM | POA: Diagnosis not present

## 2023-08-25 DIAGNOSIS — N186 End stage renal disease: Secondary | ICD-10-CM | POA: Diagnosis not present

## 2023-08-25 DIAGNOSIS — E876 Hypokalemia: Secondary | ICD-10-CM | POA: Diagnosis not present

## 2023-08-27 ENCOUNTER — Emergency Department (HOSPITAL_COMMUNITY)
Admission: EM | Admit: 2023-08-27 | Discharge: 2023-08-27 | Disposition: A | Discharge status: Home / Self Care | Attending: Emergency Medicine | Admitting: Emergency Medicine

## 2023-08-27 ENCOUNTER — Encounter (HOSPITAL_COMMUNITY): Payer: Self-pay

## 2023-08-27 ENCOUNTER — Other Ambulatory Visit: Payer: Self-pay

## 2023-08-27 DIAGNOSIS — R58 Hemorrhage, not elsewhere classified: Secondary | ICD-10-CM | POA: Diagnosis not present

## 2023-08-27 DIAGNOSIS — E8770 Fluid overload, unspecified: Secondary | ICD-10-CM | POA: Diagnosis not present

## 2023-08-27 DIAGNOSIS — D631 Anemia in chronic kidney disease: Secondary | ICD-10-CM | POA: Diagnosis not present

## 2023-08-27 DIAGNOSIS — T82838A Hemorrhage of vascular prosthetic devices, implants and grafts, initial encounter: Secondary | ICD-10-CM | POA: Diagnosis not present

## 2023-08-27 DIAGNOSIS — Z7901 Long term (current) use of anticoagulants: Secondary | ICD-10-CM | POA: Insufficient documentation

## 2023-08-27 DIAGNOSIS — N2581 Secondary hyperparathyroidism of renal origin: Secondary | ICD-10-CM | POA: Diagnosis not present

## 2023-08-27 DIAGNOSIS — Z992 Dependence on renal dialysis: Secondary | ICD-10-CM | POA: Insufficient documentation

## 2023-08-27 DIAGNOSIS — I4892 Unspecified atrial flutter: Secondary | ICD-10-CM | POA: Diagnosis not present

## 2023-08-27 DIAGNOSIS — E876 Hypokalemia: Secondary | ICD-10-CM | POA: Diagnosis not present

## 2023-08-27 DIAGNOSIS — I120 Hypertensive chronic kidney disease with stage 5 chronic kidney disease or end stage renal disease: Secondary | ICD-10-CM | POA: Insufficient documentation

## 2023-08-27 DIAGNOSIS — Z955 Presence of coronary angioplasty implant and graft: Secondary | ICD-10-CM | POA: Insufficient documentation

## 2023-08-27 DIAGNOSIS — N186 End stage renal disease: Secondary | ICD-10-CM | POA: Insufficient documentation

## 2023-08-27 DIAGNOSIS — E1022 Type 1 diabetes mellitus with diabetic chronic kidney disease: Secondary | ICD-10-CM | POA: Diagnosis not present

## 2023-08-27 DIAGNOSIS — E1122 Type 2 diabetes mellitus with diabetic chronic kidney disease: Secondary | ICD-10-CM | POA: Diagnosis not present

## 2023-08-27 MED ORDER — LIDOCAINE-EPINEPHRINE 1 %-1:100000 IJ SOLN
20.0000 mL | Freq: Once | INTRAMUSCULAR | Status: AC
  Administered 2023-08-27: 20 mL via INTRADERMAL
  Filled 2023-08-27: qty 1

## 2023-08-27 NOTE — Discharge Instructions (Addendum)
 You were seen today for bleeding from your dialysis access site. While you were here we monitored your vitals, performed a physical exam, and applied medicated gauze and dressing to control the bleeding. These were all reassuring and there is no indication for any further testing or intervention in the emergency department at this time.   Things to do:  - Follow up with your primary care provider within the next 1-2 weeks as needed - Keep your pressure bandage in place and clean/dry until your next dialysis appointment in 2 days. Return to the ED or seek urgent medical attention if you notice bleeding from the dressing or you begin to experience persistent numbness/pain/decreased pulse in your right arm, or if you have other emergent medical concerns

## 2023-08-27 NOTE — ED Triage Notes (Addendum)
 Pt BIB [facility] from dialysis. Pt's shunt on his right arm started bleeding, it was bleeding for about an hour. EMS applied a pressure dressing, they report blood was spurting from the site. EMS reapplied the pressure dressing at the bridge. Pt reports numbness and tingling in the effected extremity. Hx MI, CHF. Pt is on blood thinners (eliquis ) for Afib.   EMS 106P 108SBP 18R

## 2023-08-27 NOTE — ED Provider Notes (Signed)
 [facility] Provider Note   CSN: [id number] Arrival date & time: 08/27/23  1909   Patient presents with: Vascular Access Problem   [name] is a 63 y.o. male with medical history significant for ESRD on HD MWF, HTN, HLD, MI status post CABG X5, prior cardiac arrest, pAF/flutter on eliquis , IDDM, and prior TIA who presents for evaluation of persistent bleeding from his dialysis access site on his right upper extremity.  Patient states that he completed his dialysis session without issues but as they were removing the catheter he had persistent bleeding that did not resolve from prolonged direct pressure at dialysis center, so EMS was called.  EMS placed a pressure bandage prior to arrival.  Patient reports paresthesias and pain in his right hand ever since the pressure bandage was placed, otherwise has no other symptoms at this time.  He states that he has had multiple prior episodes of persistent bleeding from dialysis site in setting of Eliquis  use, but states that usually the bleeding is controlled holding pressure and medicated gauze (assuming this is quick clot but patient is unsure).   Prior to Admission medications   Medication Sig Start Date End Date Taking? Authorizing Provider  amiodarone  (PACERONE ) 200 MG tablet Take 1 tablet (200 mg total) by mouth daily. 10/15/22   Lelon Hamilton T, PA-C  apixaban  (ELIQUIS ) 5 MG TABS tablet Take 1 tablet (5 mg total) by mouth 2 (two) times daily. 10/15/22   Lelon Hamilton T, PA-C  atorvastatin  (LIPITOR ) 80 MG tablet Take 1 tablet (80 mg total) by mouth daily. 10/15/22   Lelon Hamilton T, PA-C  Cinacalcet HCl (SENSIPAR PO) Take 60 mg by mouth. 07/28/23 07/26/24  [provider]  cycloSPORINE  (RESTASIS ) 0.05 % ophthalmic emulsion Place 1 drop into both eyes 2 (two) times daily. 02/27/23     leptospermum manuka honey (MEDIHONEY) PSTE paste Apply topically. 12/03/21   [provider]  LOPERAMIDE  HCL  PO Take 2 mg by mouth. 05/14/23 05/12/24  [provider]  loteprednol  (LOTEMAX ) 0.5 % ophthalmic suspension Place 1 drop into both eyes daily. 02/27/23     Methoxy PEG-Epoetin Beta (MIRCERA IJ) Mircera 04/16/23 04/14/24  [provider]  Methoxy PEG-Epoetin Beta (MIRCERA IJ) 120 mcg. 01/22/23 07/14/24  [provider]  Methoxy PEG-Epoetin Beta (MIRCERA IJ) Mircera 03/19/23 04/28/24  [provider]  Methoxy PEG-Epoetin Beta (MIRCERA IJ) 75 mcg. 02/19/23 05/26/24  [provider]  multivitamin (RENA-VIT) TABS tablet Take 1 tablet by mouth. 12/03/21   [provider]  sevelamer  carbonate (RENVELA ) 800 MG tablet Take 1,600 mg by mouth.    [provider]    Allergies: Patient has no known allergies.     Updated Vital Signs BP 114/86   Pulse (!) 104   Temp 97.6 F (36.4 C) (Oral)   Resp 20   Ht 5' 7 (1.702 m)   Wt 100 kg   SpO2 94%   BMI 34.53 kg/m   Physical Exam Vitals reviewed.  Constitutional:      General: He is not in acute distress.    Appearance: He is not toxic-appearing or diaphoretic.  HENT:     Head: Normocephalic and atraumatic.     Mouth/Throat:     Mouth: Mucous membranes are moist.     Pharynx: Oropharynx is clear.  Eyes:     Extraocular Movements: Extraocular movements intact.     Conjunctiva/sclera: Conjunctivae normal.  Cardiovascular:     Rate  and Rhythm: Normal rate and regular rhythm.     Heart sounds: No murmur heard.    No gallop.  Pulmonary:     Effort: Pulmonary effort is normal. No respiratory distress.     Breath sounds: Normal breath sounds.  Musculoskeletal:        General: No deformity. Normal range of motion.     Cervical back: Normal range of motion.  Skin:    General: Skin is warm.     Comments: Bleeding from dialysis fistula site on RUE, persistent low flow oozing but no significant pulsating/arterial bleeding noted.   Neurological:     Mental Status: He is alert and oriented to person,  place, and time. Mental status is at baseline.     Comments: Paresthesias of R hand resolved after RUE pressure wound taken down. Otherwise no focal sensory or motor deficit     (all labs ordered are listed, but only abnormal results are displayed) Labs Reviewed - No data to display  EKG: None  Radiology: No results found.    Medications Ordered in the ED  lidocaine -EPINEPHrine  (XYLOCAINE  W/EPI) 1 %-1:100000 (with pres) injection 20 mL (20 mLs Intradermal Given 08/27/23 1944)    Clinical Course as of 08/28/23 0200  Thu Aug 27, 2023  2003 Applied quick clot and pressure bandage for persistent oozing, will reassess in ~30 minutes [AD]    Clinical Course User Index [AD] Raoul Rake, MD    Medical Decision Making Patient with the above history is presenting with persistent bleeding from his AV-fistula site on his RUE after completion of his dialysis session earlier today. He has a history of multiple prior similar episodes with persistent bleeding from his HD access site, likely given his chronic eliquis  use, though he states usually the prior episodes resolved after applied pressure for some time.   EMS placed pressure dressing PTA which was taken down and showed persistent oozing but no significant pulsatile/arterial bleeding from the A-V fistular site. Quick clot was placed and a clean pressure bandage was applied, and patient was observed + re-evaluated over an hour later with no signs of oozing/bleeding through or around the bandage. Patient was instructed to keep pressure bandage in place and clean/dry until his upcoming HD session in 2 days to further evaluate then. His R arm was neurovascularly intact distal to the pressure bandage with 2+ radial pulse and no active paresthesias, pain, or decreased ROM of the R hand/distal arm. Patient was given very strict return precautions.  Risk Prescription drug management.    Final diagnoses:  Bleeding from dialysis shunt,  initial encounter [facility])    ED Discharge Orders     None          Raoul Rake, MD 08/28/23 0200    Emil Share, DO 08/28/23 1502

## 2023-08-28 ENCOUNTER — Telehealth: Payer: Self-pay | Admitting: Internal Medicine

## 2023-08-28 ENCOUNTER — Ambulatory Visit: Payer: Self-pay

## 2023-08-28 NOTE — Telephone Encounter (Addendum)
 Spoke to pt, he called stating he has some swelling in both legs, also has small amount of clear fluid draining from both of them. This started about a month ago with left leg and yesterday with the right leg. No erythema, no pain. Pt denies SOB and no other symptoms related to this issue. Advised he call his PCP to see if they can schedule him asap to take a look, he has appt with Katlyn West 09/04/23.  Pt also states that just after his cardioversion on 7/7, his HR went back up to 110-115 and has stayed there. No symptoms to report with this. Pt taking meds as prescribed.  Please advise.

## 2023-08-28 NOTE — Telephone Encounter (Signed)
 Pt c/o swelling/edema: STAT if pt has developed SOB within 24 hours  If swelling, where is the swelling located? Rt lower leg draining clear substance, no pain  How much weight have you gained and in what time span? 3-4 lbs in last week   Have you gained 2 pounds in a day or 5 pounds in a week? -  Do you have a log of your daily weights (if so, list)? No  Are you currently taking a fluid pill? No  Are you currently SOB? No  Have you traveled recently in a car or plane for an extended period of time? No

## 2023-08-28 NOTE — Telephone Encounter (Signed)
Please see msg and advise.  

## 2023-08-28 NOTE — Telephone Encounter (Signed)
 FYI Only or Action Required?: Action required by provider: request for appointment.  Patient was last seen in primary care on 04/29/2023 by Tanda Bleacher, MD.  Called Nurse Triage reporting Leg Swelling.  Symptoms began several weeks ago.  Interventions attempted: Nothing.  Symptoms are: unchanged. Pt. Has swelling to both legs that I've had awhile. But now they are leaking fluid. Requesting to be worked in.  Triage Disposition: See Physician Within 24 Hours  Patient/caregiver understands and will follow disposition?: Yes   Copied from CRM [id number]. Topic: Clinical - Red Word Triage >> Aug 28, 2023  1:22 PM [name] wrote: Red Word that prompted transfer to Nurse Triage:  On 08/17/23, he had a TRANSESOPHAGEAL ECHOCARDIOGRAM N/ Anesthesia Care CARDIOVERSION. He is having liquid leaking out of his right leg in 3 places and some in left leg (both below knees). Reason for Disposition  [1] MODERATE leg swelling (e.g., swelling extends up to knees) AND [2] new-onset or getting worse  Answer Assessment - Initial Assessment Questions 1. ONSET: When did the swelling start? (e.g., minutes, hours, days)     Several months 2. LOCATION: What part of the leg is swollen?  Are both legs swollen or just one leg?     both 3. SEVERITY: How bad is the swelling? (e.g., localized; mild, moderate, severe)     shin 4. REDNESS: Is there redness or signs of infection?     no 5. PAIN: Is the swelling painful to touch? If Yes, ask: How painful is it?   (Scale 1-10; mild, moderate or severe)     no 6. FEVER: Do you have a fever? If Yes, ask: What is it, how was it measured, and when did it start?      no 7. CAUSE: What do you think is causing the leg swelling?     dialysis 8. MEDICAL HISTORY: Do you have a history of blood clots (e.g., DVT), cancer, heart failure, kidney disease, or liver failure?     Kidney disease 9. RECURRENT SYMPTOM: Have you had leg swelling before? If Yes,  ask: When was the last time? What happened that time?     yes 10. OTHER SYMPTOMS: Do you have any other symptoms? (e.g., chest pain, difficulty breathing)       no 11. PREGNANCY: Is there any chance you are pregnant? When was your last menstrual period?       N/a  Protocols used: Leg Swelling and Edema-A-AH

## 2023-08-29 ENCOUNTER — Encounter (HOSPITAL_COMMUNITY): Payer: Self-pay | Admitting: Emergency Medicine

## 2023-08-29 ENCOUNTER — Emergency Department (HOSPITAL_COMMUNITY)
Admission: EM | Admit: 2023-08-29 | Discharge: 2023-08-29 | Disposition: A | Discharge status: Home / Self Care | Attending: Emergency Medicine | Admitting: Emergency Medicine

## 2023-08-29 ENCOUNTER — Other Ambulatory Visit: Payer: Self-pay

## 2023-08-29 DIAGNOSIS — T82838A Hemorrhage of vascular prosthetic devices, implants and grafts, initial encounter: Secondary | ICD-10-CM | POA: Diagnosis not present

## 2023-08-29 DIAGNOSIS — I120 Hypertensive chronic kidney disease with stage 5 chronic kidney disease or end stage renal disease: Secondary | ICD-10-CM | POA: Diagnosis not present

## 2023-08-29 DIAGNOSIS — Y841 Kidney dialysis as the cause of abnormal reaction of the patient, or of later complication, without mention of misadventure at the time of the procedure: Secondary | ICD-10-CM | POA: Insufficient documentation

## 2023-08-29 DIAGNOSIS — T85698A Other mechanical complication of other specified internal prosthetic devices, implants and grafts, initial encounter: Secondary | ICD-10-CM | POA: Diagnosis not present

## 2023-08-29 DIAGNOSIS — N186 End stage renal disease: Secondary | ICD-10-CM

## 2023-08-29 DIAGNOSIS — Z992 Dependence on renal dialysis: Secondary | ICD-10-CM | POA: Diagnosis not present

## 2023-08-29 DIAGNOSIS — Z7901 Long term (current) use of anticoagulants: Secondary | ICD-10-CM | POA: Insufficient documentation

## 2023-08-29 LAB — RENAL FUNCTION PANEL
Albumin: 3.6 g/dL (ref 3.5–5.0)
Anion gap: 18 — ABNORMAL HIGH (ref 5–15)
BUN: 51 mg/dL — ABNORMAL HIGH (ref 8–23)
CO2: 25 mmol/L (ref 22–32)
Calcium: 8.4 mg/dL — ABNORMAL LOW (ref 8.9–10.3)
Chloride: 94 mmol/L — ABNORMAL LOW (ref 98–111)
Creatinine, Ser: 8.21 mg/dL — ABNORMAL HIGH (ref 0.61–1.24)
GFR, Estimated: 7 mL/min — ABNORMAL LOW (ref >60)
Glucose, Bld: 115 mg/dL — ABNORMAL HIGH (ref 70–99)
Phosphorus: 5.5 mg/dL — ABNORMAL HIGH (ref 2.5–4.6)
Potassium: 3.6 mmol/L (ref 3.5–5.1)
Sodium: 137 mmol/L (ref 135–145)

## 2023-08-29 LAB — CBC
HCT: 31.2 % — ABNORMAL LOW (ref 39.0–52.0)
Hemoglobin: 9.8 g/dL — ABNORMAL LOW (ref 13.0–17.0)
MCH: 29.4 pg (ref 26.0–34.0)
MCHC: 31.4 g/dL (ref 30.0–36.0)
MCV: 93.7 fL (ref 80.0–100.0)
Platelets: 170 K/uL (ref 150–400)
RBC: 3.33 MIL/uL — ABNORMAL LOW (ref 4.22–5.81)
RDW: 17.3 % — ABNORMAL HIGH (ref 11.5–15.5)
WBC: 9.7 K/uL (ref 4.0–10.5)
nRBC: 0 % (ref 0.0–0.2)

## 2023-08-29 LAB — HEPATITIS B SURFACE ANTIGEN: Hepatitis B Surface Ag: NONREACTIVE

## 2023-08-29 MED ORDER — ALTEPLASE 2 MG IJ SOLR: 2.0000 mg | Freq: Once | INTRAMUSCULAR | Status: DC | PRN

## 2023-08-29 MED ORDER — ANTICOAGULANT SODIUM CITRATE 4% (200MG/5ML) IV SOLN
5.0000 mL | Status: DC | PRN
  Filled 2023-08-29: qty 5

## 2023-08-29 MED ORDER — HEPARIN SODIUM (PORCINE) 1000 UNIT/ML DIALYSIS: 1000.0000 [IU] | INTRAMUSCULAR | Status: DC | PRN

## 2023-08-29 MED ORDER — CHLORHEXIDINE GLUCONATE CLOTH 2 % EX PADS: 6.0000 | MEDICATED_PAD | Freq: Every day | CUTANEOUS | Status: DC

## 2023-08-29 MED ORDER — PENTAFLUOROPROP-TETRAFLUOROETH EX AERO
1.0000 | INHALATION_SPRAY | CUTANEOUS | Status: DC | PRN
Start: 2023-08-29 — End: 2023-08-29

## 2023-08-29 MED ORDER — LIDOCAINE-PRILOCAINE 2.5-2.5 % EX CREA: 1.0000 | TOPICAL_CREAM | CUTANEOUS | Status: DC | PRN

## 2023-08-29 MED ORDER — LIDOCAINE HCL (PF) 1 % IJ SOLN: 5.0000 mL | INTRAMUSCULAR | Status: DC | PRN

## 2023-08-29 NOTE — ED Triage Notes (Signed)
 Per [facility] pt coming from dialysis- states he came here Thursday after fistula would not stop bleeding after treatment. Patient went to dialysis today early when appt was for 12. States the gauze was very itchy and when they pulled it off it began leaking. No bleeding noted at this moment. No pain.

## 2023-08-29 NOTE — ED Notes (Signed)
 Pt ambulated to restroom with steady gait.

## 2023-08-29 NOTE — Procedures (Signed)
 Patient was seen on dialysis and the procedure /was supervised.  BFR 400  Via AVF BP is  108/78. AVF site looks good, no bleed, T/B+, will monitor after HD, need to apply pressure after decannulation of needle.    Patient appears to be tolerating treatment well  Tamula Morrical Amelie Romney 08/29/2023

## 2023-08-29 NOTE — Progress Notes (Signed)
 Received patient in bed to unit.  Alert and oriented.  Informed consent signed and in chart.   TX duration: 2 hours and 30 minute.  Patient wanted to come off 30 minutes early, AMA paperwork signed.  Patient tolerated well.  Transported back to the room  Alert, without acute distress.  Hand-off given to patient's nurse.   Access used: Rigth Upper Arm fistula Access issues: none  Total UF removed: 3.3L Medication(s) given: none   08/29/23 1726  Vitals  Temp 97.6 F (36.4 C)  Temp Source Oral  BP 109/73  Pulse Rate (!) 114  ECG Heart Rate (!) 114  Resp (!) 21  Oxygen  Therapy  SpO2 100 %  O2 Device Nasal Cannula  O2 Flow Rate (L/min) 2 L/min  During Treatment Monitoring  Duration of HD Treatment -hour(s) 2.5 hour(s)  HD Safety Checks Performed Yes  Intra-Hemodialysis Comments See progress note (Patient signed AMA paperwork to come off 30 minutes early)  Dialysis Fluid Bolus Normal Saline  Bolus Amount (mL) 300 mL  Post Treatment  Dialyzer Clearance Clear  Liters Processed 60.7  Fluid Removed (mL) 3300 mL  Tolerated HD Treatment Yes  Post-Hemodialysis Comments Patient signed AMA paperwork to come off early  AVG/AVF Arterial Site Held (minutes) 7 minutes  AVG/AVF Venous Site Held (minutes) 7 minutes  Fistula / Graft Right Upper arm Arteriovenous fistula  Placement Date/Time: 03/14/22 1001   Placed prior to admission: No  Orientation: Right  Access Location: (c) Upper arm  Access Type: Arteriovenous fistula  Status Deaccessed     Camellia Brasil LPN [facility]

## 2023-08-29 NOTE — ED Notes (Signed)
 Pt ambulated to bathroom

## 2023-08-29 NOTE — Discharge Instructions (Signed)
 You are seen emergency department today for concerns of bleeding from your fistula site.  There is no bleeding present at this time.  You received a session of hemodialysis here in the emergency department today.  Please return the emergency department for any concerns or new or worsening symptoms.

## 2023-08-29 NOTE — ED Provider Notes (Signed)
  EMERGENCY DEPARTMENT AT [facility] Provider Note   CSN: [id number] Arrival date & time: 08/29/23  9270     Patient presents with: Vascular Access Problem   [name] is a 63 y.o. male.   Pt complains of bleeding from his dialysis graft.  Pt was seen here 2 days ago for bleeding from site after dialysis.  Pt reports trauma gauze was used to stop bleeding.  Pt reports when they removed gauze today he had more bleeding.  Pt reports area was very itchy.  Pt has 2x2 dressing on wound now.  Pt reports bleeding has stopped.  Pt reports he was told to come here for dialysis due to bleeding.  Pt is on eliquis .  Pt receives dialysis Tuesday, Thursday and Saturday at [facility].   The history is provided by the patient. No language interpreter was used.       Prior to Admission medications   Medication Sig Start Date End Date Taking? Authorizing Provider  amiodarone  (PACERONE ) 200 MG tablet Take 1 tablet (200 mg total) by mouth daily. 10/15/22   Lelon Hamilton T, PA-C  apixaban  (ELIQUIS ) 5 MG TABS tablet Take 1 tablet (5 mg total) by mouth 2 (two) times daily. 10/15/22   Lelon Hamilton T, PA-C  atorvastatin  (LIPITOR ) 80 MG tablet Take 1 tablet (80 mg total) by mouth daily. 10/15/22   Lelon Hamilton T, PA-C  Cinacalcet HCl (SENSIPAR PO) Take 60 mg by mouth. 07/28/23 07/26/24  [provider]  cycloSPORINE  (RESTASIS ) 0.05 % ophthalmic emulsion Place 1 drop into both eyes 2 (two) times daily. 02/27/23     leptospermum manuka honey (MEDIHONEY) PSTE paste Apply topically. 12/03/21   [provider]  LOPERAMIDE  HCL PO Take 2 mg by mouth. 05/14/23 05/12/24  [provider]  loteprednol  (LOTEMAX ) 0.5 % ophthalmic suspension Place 1 drop into both eyes daily. 02/27/23     Methoxy PEG-Epoetin Beta (MIRCERA IJ) Mircera 04/16/23 04/14/24  [provider]  Methoxy PEG-Epoetin Beta (MIRCERA IJ) 120 mcg. 01/22/23 07/14/24  [provider]  Methoxy PEG-Epoetin Beta  (MIRCERA IJ) Mircera 03/19/23 04/28/24  [provider]  Methoxy PEG-Epoetin Beta (MIRCERA IJ) 75 mcg. 02/19/23 05/26/24  [provider]  multivitamin (RENA-VIT) TABS tablet Take 1 tablet by mouth. 12/03/21   [provider]  sevelamer  carbonate (RENVELA ) 800 MG tablet Take 1,600 mg by mouth.    [provider]    Allergies: Patient has no known allergies.    Review of Systems  All other systems reviewed and are negative.   Updated Vital Signs BP 111/75 (BP Location: Left Arm)   Pulse (!) 115   Temp 99.5 F (37.5 C) (Oral)   Resp 19   Ht 5' 7 (1.702 m)   Wt 100 kg   SpO2 100%   BMI 34.53 kg/m   Physical Exam Vitals and nursing note reviewed.  Constitutional:      Appearance: He is well-developed.  HENT:     Head: Normocephalic.  Cardiovascular:     Rate and Rhythm: Normal rate.  Pulmonary:     Effort: Pulmonary effort is normal.  Abdominal:     General: There is no distension.  Musculoskeletal:        General: Normal range of motion.     Comments: Dressing removed from right arm, no bleeding   Skin:    General: Skin is warm.  Neurological:     General: No focal deficit present.  Mental Status: He is alert and oriented to person, place, and time.     (all labs ordered are listed, but only abnormal results are displayed) Labs Reviewed - No data to display  EKG: None  Radiology: No results found.   Procedures   Medications Ordered in the ED - No data to display                                  Medical Decision Making Patient reports that he had bleeding from his dialysis graft after dialysis on Thursday.  Patient was seen here and bleeding was stopped with combat gauze.  Patient states today when they removed the gauze to do his dialysis he had bleeding from the same site.  Amount and/or Complexity of Data Reviewed Discussion of management or test interpretation with external provider(s): I discussed the patient with  Dr. Dolan who will evaluate patient for dialysis here and monitor for further bleeding.  Risk Risk Details: Patient will return to the emergency department after dialysis for recheck.        Final diagnoses:  Bleeding from dialysis shunt, initial encounter [facility])    ED Discharge Orders     None          Mark Mccleese K, PA-C 08/29/23 9070    Mark Lot, MD 08/30/23 [phone]

## 2023-08-29 NOTE — ED Provider Notes (Signed)
  Physical Exam  BP 110/74   Pulse (!) 113   Temp 97.6 F (36.4 C) (Oral)   Resp (!) 22   Ht 5' 7 (1.702 m)   Wt 99.4 kg Comment: standing  SpO2 99%   BMI 34.32 kg/m   Physical Exam Vitals and nursing note reviewed.  Constitutional:      Appearance: He is well-developed.  HENT:     Head: Normocephalic.  Cardiovascular:     Rate and Rhythm: Normal rate.     Comments: Bruit heard over left AV site. Palpable thrill present. Pulmonary:     Effort: Pulmonary effort is normal.  Abdominal:     General: There is no distension.  Musculoskeletal:        General: Normal range of motion.     Comments: Dressing removed from right arm, no bleeding   Skin:    General: Skin is warm.  Neurological:     General: No focal deficit present.     Mental Status: He is alert and oriented to person, place, and time.     Procedures  Procedures  ED Course / MDM    Medical Decision Making  Patient was initially seen today for concerns of vascular access problems and received a session of hemodialysis while here in the hospital.  There was initial concerns from prior dialysis session that he had active bleeding posttermination of dialysis session and given patient's use of a blood thinner, was advised to wear a compressive dressing until he could follow-up for repeat session of dialysis and at that time hopefully have controlled bleeding.  Was evaluated earlier by [facility], PA-C.  Appears her bleeding had stopped at that time.  No current bleeding at this time after returning back from hemodialysis session.  Discharged home in stable condition.       Maverick Patman A, PA-C 08/29/23 [place]    Freddi Hamilton, MD 08/30/23 1910

## 2023-08-29 NOTE — Progress Notes (Signed)
 [name] 11/22/1960 [id number]   Patient is a 63 yo male with ESRD on HD MWF at [facility], who was sent to the ED today from his dialysis unit due to prolonged bleeding from AVF. He reports another episode of bleeding at home a couple weeks ago but stopped with applied pressure.  Per CN at dialysis, he had bleeding from AVF Thursday after treatment that continued for a couple hours prior to sending him to ED.  Pressure bandage applied in the ED with no further bleeding noted.  When returned to HD today it started to bleed again once bandage removed. Dialysis CN reported seeing opening/hole over AVF where he was bleeding. He was sent to ED for evaluation.    Currently no bleeding present.  Cannulation sites appear intact.  Plan for HD today from the ED with re-evaluation post. On RA, no crackles with nml WOB but significant LE edema.  Will plan for UF 3.5-4L during a 3 hour treatment. Admits to cutting treatments short.   If prolonged bleeding post HD would consult Vascular.  Otherwise can plan for outpatient fistulogram.  OP HD Orders:  [facility] TTS 4hrs 2k 2Ca 400/800 99kg No heparin  Venofer 100mg  IV qHD until 09/05/23 Calcitriol 1.75mcg qHD Sensipar 60mg  qHD  Manuelita Labella, PA-C [employer]

## 2023-08-30 LAB — HEPATITIS B SURFACE ANTIBODY, QUANTITATIVE: Hep B S AB Quant (Post): 367 m[IU]/mL

## 2023-09-01 DIAGNOSIS — E876 Hypokalemia: Secondary | ICD-10-CM | POA: Diagnosis not present

## 2023-09-01 DIAGNOSIS — D631 Anemia in chronic kidney disease: Secondary | ICD-10-CM | POA: Diagnosis not present

## 2023-09-01 DIAGNOSIS — E8770 Fluid overload, unspecified: Secondary | ICD-10-CM | POA: Diagnosis not present

## 2023-09-01 DIAGNOSIS — N186 End stage renal disease: Secondary | ICD-10-CM | POA: Diagnosis not present

## 2023-09-01 DIAGNOSIS — Z992 Dependence on renal dialysis: Secondary | ICD-10-CM | POA: Diagnosis not present

## 2023-09-01 DIAGNOSIS — E1122 Type 2 diabetes mellitus with diabetic chronic kidney disease: Secondary | ICD-10-CM | POA: Diagnosis not present

## 2023-09-01 DIAGNOSIS — N2581 Secondary hyperparathyroidism of renal origin: Secondary | ICD-10-CM | POA: Diagnosis not present

## 2023-09-01 NOTE — Telephone Encounter (Signed)
 West, Katlyn D, NP  Patient has history of ESRD on dialysis, he needs to ensure that he is attending dialysis regularly, they may need to increase the amount of fluid that is being removed if he is noting fluid accumulation.  Agree with recommendation to see if he can be seen by his PCP regarding drainage.  Does patient mean that his heart rate went back up to 110-115 or is he referring to his systolic blood pressure?    Katlyn West, NP   Tried to call pt to relay provider feedback from Katlyn West, NP. No answer, left msg asking pt to call back. Pt has appt on 09/04/23 with this provider as well.

## 2023-09-02 DIAGNOSIS — D631 Anemia in chronic kidney disease: Secondary | ICD-10-CM | POA: Diagnosis not present

## 2023-09-02 DIAGNOSIS — Z992 Dependence on renal dialysis: Secondary | ICD-10-CM | POA: Diagnosis not present

## 2023-09-02 DIAGNOSIS — N2581 Secondary hyperparathyroidism of renal origin: Secondary | ICD-10-CM | POA: Diagnosis not present

## 2023-09-02 DIAGNOSIS — E1122 Type 2 diabetes mellitus with diabetic chronic kidney disease: Secondary | ICD-10-CM | POA: Diagnosis not present

## 2023-09-02 DIAGNOSIS — E8770 Fluid overload, unspecified: Secondary | ICD-10-CM | POA: Diagnosis not present

## 2023-09-02 DIAGNOSIS — E876 Hypokalemia: Secondary | ICD-10-CM | POA: Diagnosis not present

## 2023-09-02 DIAGNOSIS — N186 End stage renal disease: Secondary | ICD-10-CM | POA: Diagnosis not present

## 2023-09-03 DIAGNOSIS — N2581 Secondary hyperparathyroidism of renal origin: Secondary | ICD-10-CM | POA: Diagnosis not present

## 2023-09-03 DIAGNOSIS — E876 Hypokalemia: Secondary | ICD-10-CM | POA: Diagnosis not present

## 2023-09-03 DIAGNOSIS — N186 End stage renal disease: Secondary | ICD-10-CM | POA: Diagnosis not present

## 2023-09-03 DIAGNOSIS — E1122 Type 2 diabetes mellitus with diabetic chronic kidney disease: Secondary | ICD-10-CM | POA: Diagnosis not present

## 2023-09-03 DIAGNOSIS — Z992 Dependence on renal dialysis: Secondary | ICD-10-CM | POA: Diagnosis not present

## 2023-09-03 DIAGNOSIS — E8770 Fluid overload, unspecified: Secondary | ICD-10-CM | POA: Diagnosis not present

## 2023-09-03 DIAGNOSIS — D631 Anemia in chronic kidney disease: Secondary | ICD-10-CM | POA: Diagnosis not present

## 2023-09-03 NOTE — Progress Notes (Unsigned)
 Cardiology Office Note    Date:  09/04/2023  ID:  Mark, Reyes 1960-10-14, MRN [id number] PCP:  Tanda Bleacher, MD  Cardiologist:  Lurena MARLA Red, MD  Electrophysiologist:  None   Chief Complaint: Follow up for atrial flutter   History of Present Illness: .    [name] is a 63 y.o. male with visit-pertinent history of ESRD on HD, CAD s/p CABG in 09/2021, paroxysmal atrial fibs/flutter, type II DM, history of CVA in 12/2020, history of PEA arrest in setting of cardiac tamponade in 09/2021 s/p pericardial window, hypertension, hyperlipidemia, alcohol  abuse, tobacco use.  Patient with extensive history of CAD and previously had stents placed to the LAD, high marginal and distal circumflex.  Patient underwent CABG x 5 on 09/12/2021 with LIMA to LAD, SVG diagonal, SVG to OM1-OM2, SVG to dRCA.  He did have postoperative atrial fibrillation and he was treated with carvedilol  and Eliquis .  On 09/28/2021 he presented to the hospital complaining of lethargy and dyspnea on exertion.  Echo on 09/28/2021 showed EF 50%, grade 2 diastolic dysfunction, moderate left-sided pericardial effusion with evidence of possible tamponade.  He underwent surgical evacuation of pericardial hematoma on 8/19.  After the procedure patient had recurrent atrial fibrillation and was treated with IV amiodarone .  He developed bradycardia and PEA arrest.  Patient was intubated and bedside echocardiogram showed a compressed LV with large posterior effusion/clot.  He was intubated and arrested a second time.  Dr. Shyrl performed emergent bedside sternotomy with removal of a large amount of clot, later that day patient had a third recurrent cardiac arrest.  The chest was unpacked, pericardium was suctioned and Dr. Cherrie performed cardiac massage.  Patient was taken emergently to the OR for washout and VA ECMO support.  Again required over washout on 09/30/2021.  On 10/01/2021 patient developed A-fib and was treated with IV  amiodarone .  Repeat washout on 10/03/2021 and had an Impella placed on 8/28, ECMO was discontinued.  On 8/29 he had a PEA arrest and later had elevated heart rate and atrial flutter despite multiple boluses of amiodarone .  He underwent DCCV with conversion to normal sinus rhythm.  Had chest close on 9/2 and Impella was extracted on 9/7.  He had a percutaneous tracheostomy placed on 9/7 as well.  Patient was transmitted to [facility] on 9/21 and had a tunneled HD cath placed on 9/25.  He was eventually discharged on 12/03/2021.  Limited echo on 10/23/2021 showed LVEF 65 to 70%, no RWMA, G1 DD, mildly reduced RV systolic function.  On 05/15/2022 patient presented to ED from dialysis with tachycardia.  Per EMS the dialysis center noted patient's heart rate had jumped to the 140s.  Patient complained of intermittent chest pain and shortness of breath.  In the ED EKG showed atrial flutter with heart rate 143 bpm.  Labs showed NA 135, K4.4, creatinine 7.4.  Patient converted to normal sinus rhythm with heart rate in the 100s after receiving p.o. metoprolol .  Patient was seen in clinic on 01/14/2023 by [name], PA.  Patient reported exertional dyspnea over the prior few months, he noted difficulty walking from the car to the clinic due to breathlessness, necessitating rest.  He denied any chest discomfort or pain, orthopnea or lower extremity edema.  Patient acknowledged suboptimal adherence to his dialysis regimen, often leaving before recommended 4-hour session was completed.  Patient's ECG showed persistent inferior lateral and anterolateral T wave inversions, it was recommended that patient have Lexiscan Myoview  to rule out graft failure, was not completed.  Patient was instructed to follow-up in 4 to 6 weeks, was lost to follow-up.  Patient was seen in clinic on 08/12/2023 regarding increased heart rate.  Patient reported that for the prior several weeks his heart rate had consistently been in the 120s when he is  presenting to dialysis.  You are reported increased dyspnea on exertion, patient reported that he had previously been told to hold his metoprolol  and amiodarone  at dialysis.  Patient reported that prior to appointment he had started consistently taking his medications 2 weeks prior when his noted his heart rate would be in the 130s.  Patient was started on amiodarone  200 mg twice daily for 2 weeks then 200 mg daily, patient was set up for TEE with cardioversion.  TEE on 08/17/2023 indicated LVEF 60 to 65%, normal wall motion, RV was normal size with mildly reduced systolic function, left atrium was mildly dilated, right atrium was normal in size and function without any thrombus or masses, mitral valve leaflets were sclerotic with mild regurgitation, aortic valve sclerotic appearing with no regurgitation.  Patient had successful cardioversion with restoration of normal sinus rhythm.  Today he presents for follow-up.  He reports that he is doing well overall. He notes that he had quick return to atrial flutter, notes that 2 days following his cardioversion his heart rate increased back to 130 then settled at 110 bpm.  Patient denies any palpitations or feeling of increased heart rates.  He denies any chest pain or significant shortness of breath.  He notes with prolonged exertion he does become short of breath, he reports that this is not a significant change.  He endorses mild ankle edema, reports compliance with dialysis however has not informed his dialysis since or of increased lower extremity edema.  Patient denies any orthopnea or PND, presyncope or syncope.  Patient notes some slight confusion regarding his medications, reports that he has continued amiodarone  200 mg twice daily.  ROS: .   Today he denies chest pain, fatigue, palpitations, melena, hematuria, hemoptysis, diaphoresis, weakness, presyncope, syncope, orthopnea, and PND.  All other systems are reviewed and otherwise negative. Studies Reviewed:  SABRA    EKG:  EKG is ordered today, personally reviewed, demonstrating  EKG Interpretation Date/Time:  Friday September 04 2023 14:50:43 EDT Ventricular Rate:  117 PR Interval:  120 QRS Duration:  136 QT Interval:  410 QTC Calculation: 571 R Axis:   80  Text Interpretation: Critical Test Result: Long QTc Atrial flutter Non-specific intra-ventricular conduction block T wave abnormality, consider inferior ischemia Confirmed by Severina Sykora [id number]) on 09/04/2023 7:40:21 PM   CV Studies: Cardiac studies reviewed are outlined and summarized above. Otherwise please see EMR for full report. Cardiac Studies & Procedures   ______________________________________________________________________________________________ CARDIAC CATHETERIZATION  CARDIAC CATHETERIZATION 12/26/2020  Conclusion   Prox Cx lesion is 60% stenosed.   Prox Cx to Mid Cx lesion is 90% stenosed.   1st Mrg-2 lesion is 80% stenosed.   Mid LAD lesion is 50% stenosed.   Mid LAD to Dist LAD lesion is 80% stenosed.   2nd Diag lesion is 80% stenosed.   RPAV lesion is 50% stenosed.   RV Branch-1 lesion is 70% stenosed.   RV Branch-2 lesion is 80% stenosed.   Prox RCA-1 lesion is 90% stenosed.   Prox RCA to Mid RCA lesion is 95% stenosed.   Mid RCA lesion is 20% stenosed.   Prox RCA-2 lesion is 65% stenosed.  Dist RCA lesion is 20% stenosed.   1st Diag lesion is 90% stenosed.   Prox LAD lesion is 30% stenosed.   Previously placed Mid Cx to Dist Cx stent (unknown type) is  widely patent.   Previously placed 1st Mrg-1 stent (unknown type) is  widely patent.   LV end diastolic pressure is severely elevated.  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  In this diabetic male with significant diffuse disease in the LAD, left circumflex, and right coronary artery recommend surgical evaluation for consideration of CABG  revascularization.  RECOMMENDATION: Surgical consultation for consideration of CABG.  We will reinitiate heparin  later this evening.  Patient continues to be on intravenous nitroglycerin .  Increase medical therapy as blood pressure and heart rate allow.  Aggressive lipid-lowering therapy with target LDL in the 50s or below.  Findings Coronary Findings Diagnostic  Dominance: Right  Left Anterior Descending Prox LAD lesion is 30% stenosed. Mid LAD lesion is 50% stenosed. The lesion was previously treated . Mid LAD to Dist LAD lesion is 80% stenosed.  First Diagonal Branch Collaterals 1st Diag filled by collaterals from Dist LAD.  1st Diag lesion is 90% stenosed.  Second Diagonal Branch 2nd Diag lesion is 80% stenosed.  Left Circumflex Prox Cx lesion is 60% stenosed. Prox Cx to Mid Cx lesion is 90% stenosed. Previously placed Mid Cx to Dist Cx stent (unknown type) is  widely patent.  First Obtuse Marginal Branch Previously placed 1st Mrg-1 stent (unknown type) is  widely patent. 1st Mrg-2 lesion is 80% stenosed.  Right Coronary Artery Prox RCA-1 lesion is 90% stenosed. Prox RCA-2 lesion is 65% stenosed. Prox RCA to Mid RCA lesion is 95% stenosed. Mid RCA lesion is 20% stenosed. Dist RCA lesion is 20% stenosed.  Right Ventricular Branch RV Branch-1 lesion is 70% stenosed. RV Branch-2 lesion is 80% stenosed.  Right Posterior Atrioventricular Artery RPAV lesion is 50% stenosed.  Intervention  No interventions have been documented.   CARDIAC CATHETERIZATION 12/25/2020  Conclusion   Prox RCA lesion is 90% stenosed.   RV Branch-1 lesion is 70% stenosed.   RV Branch-2 lesion is 80% stenosed.   Prox RCA to Mid RCA lesion is 90% stenosed.   Mid RCA lesion is 20% stenosed.   Mid RCA to Dist RCA lesion is 30% stenosed.   2nd RPL lesion is 50% stenosed.  There is severe CAD in a dominant right coronary artery with 90% proximal and mid stenoses, diffuse 70 and 80%  stenoses in an RV marginal branch, and mid distal 20 and 30% stenoses.  Due to innominate vessel tortuosity as well as significant radial vasospasm, the left coronary system was unable to be cannulated despite attempts with multiple catheters and additional verapamil , intra-arterial nitroglycerin  and IV nitroglycerin .  Will initiate IV heparin  8 hours post procedure and plan for completion of the diagnostic catheterization to the left coronary system tomorrow via the femoral approach and plan for probable PCI to the RCA depending upon left coronary findings.  Findings Coronary Findings Diagnostic  Dominance: Right  Right Coronary Artery There is mild diffuse disease throughout the vessel. Prox RCA lesion is 90% stenosed. Prox RCA to Mid RCA lesion is 90% stenosed. Mid RCA lesion is 20% stenosed. Mid RCA to Dist RCA lesion is 30% stenosed.  Right Ventricular Branch Vessel is small in size. RV Branch-1 lesion is 70% stenosed. RV Branch-2 lesion is 80%  stenosed.  Second Right Posterolateral Branch 2nd RPL lesion is 50% stenosed.  Intervention  No interventions have been documented.     ECHOCARDIOGRAM  ECHOCARDIOGRAM COMPLETE 05/16/2022  Narrative ECHOCARDIOGRAM REPORT    Patient Name:   [facility] Date of Exam: 05/16/2022 Medical Rec #:  [id number]    Height:       67.0 in Accession #:    [id number]   Weight:       175.0 lb Date of Birth:  1960-06-23    BSA:          1.911 m Patient Age:    62 years     BP:           133/103 mmHg Patient Gender: M            HR:           88 bpm. Exam Location:  Inpatient  Procedure: 2D Echo, Cardiac Doppler and Color Doppler  Indications:    R07.9* Chest pain, unspecified, Dyspnea  History:        Patient has prior history of Echocardiogram examinations. CAD, Prior CABG, Stroke; Risk Factors:Hypertension, Dyslipidemia and Diabetes.  Sonographer:    Morna Luis Referring Phys: [id number] ROLLO JONELLE LOUDER  IMPRESSIONS   1. Left  ventricular ejection fraction, by estimation, is 60 to 65%. The left ventricle has normal function. The left ventricle has no regional wall motion abnormalities. There is mild concentric left ventricular hypertrophy. Left ventricular diastolic parameters were normal. 2. Right ventricular systolic function is normal. The right ventricular size is normal. Tricuspid regurgitation signal is inadequate for assessing PA pressure. 3. The mitral valve is degenerative. No evidence of mitral valve regurgitation. No evidence of mitral stenosis. 4. The aortic valve has an indeterminant number of cusps. There is moderate calcification of the aortic valve. There is moderate thickening of the aortic valve. Aortic valve regurgitation is trivial. Aortic valve sclerosis/calcification is present, without any evidence of aortic stenosis. 5. The inferior vena cava was not well visualized.  FINDINGS Left Ventricle: Left ventricular ejection fraction, by estimation, is 60 to 65%. The left ventricle has normal function. The left ventricle has no regional wall motion abnormalities. The left ventricular internal cavity size was normal in size. There is mild concentric left ventricular hypertrophy. Left ventricular diastolic parameters were normal. Normal left ventricular filling pressure.  Right Ventricle: The right ventricular size is normal. No increase in right ventricular wall thickness. Right ventricular systolic function is normal. Tricuspid regurgitation signal is inadequate for assessing PA pressure.  Left Atrium: Left atrial size was normal in size.  Right Atrium: Right atrial size was normal in size.  Pericardium: There is no evidence of pericardial effusion.  Mitral Valve: The mitral valve is degenerative in appearance. There is moderate thickening of the mitral valve leaflet(s). There is moderate calcification of the mitral valve leaflet(s). No evidence of mitral valve regurgitation. No evidence of  mitral valve stenosis.  Tricuspid Valve: The tricuspid valve is normal in structure. Tricuspid valve regurgitation is not demonstrated. No evidence of tricuspid stenosis.  Aortic Valve: The aortic valve has an indeterminant number of cusps. There is moderate calcification of the aortic valve. There is moderate thickening of the aortic valve. Aortic valve regurgitation is trivial. Aortic regurgitation PHT measures 277 msec. Aortic valve sclerosis/calcification is present, without any evidence of aortic stenosis.  Pulmonic Valve: The pulmonic valve was normal in structure. Pulmonic valve regurgitation is not visualized. No evidence of pulmonic stenosis.  Aorta: The  aortic root is normal in size and structure.  Venous: The inferior vena cava was not well visualized.  IAS/Shunts: No atrial level shunt detected by color flow Doppler.   LEFT VENTRICLE PLAX 2D LVIDd:         4.10 cm      Diastology LVIDs:         2.50 cm      LV e' medial:    9.25 cm/s LV PW:         1.20 cm      LV E/e' medial:  7.3 LV IVS:        1.20 cm      LV e' lateral:   10.80 cm/s LVOT diam:     1.90 cm      LV E/e' lateral: 6.2 LV SV:         37 LV SV Index:   19 LVOT Area:     2.84 cm  LV Volumes (MOD) LV vol d, MOD A2C: 108.0 ml LV vol d, MOD A4C: 99.9 ml LV vol s, MOD A2C: 39.6 ml LV vol s, MOD A4C: 38.4 ml LV SV MOD A2C:     68.4 ml LV SV MOD A4C:     99.9 ml LV SV MOD BP:      66.2 ml  RIGHT VENTRICLE RV Basal diam:  3.30 cm RV S prime:     8.50 cm/s TAPSE (M-mode): 1.1 cm  LEFT ATRIUM             Index        RIGHT ATRIUM           Index LA diam:        3.50 cm 1.83 cm/m   RA Area:     15.90 cm LA Vol (A2C):   41.1 ml 21.51 ml/m  RA Volume:   41.90 ml  21.93 ml/m LA Vol (A4C):   38.5 ml 20.15 ml/m LA Biplane Vol: 39.8 ml 20.83 ml/m AORTIC VALVE LVOT Vmax:   65.30 cm/s LVOT Vmean:  43.300 cm/s LVOT VTI:    0.129 m AI PHT:      277 msec  AORTA Ao Root diam: 2.90 cm  MITRAL  VALVE MV Area (PHT): 4.49 cm    SHUNTS MV Decel Time: 169 msec    Systemic VTI:  0.13 m MV E velocity: 67.30 cm/s  Systemic Diam: 1.90 cm MV A velocity: 50.60 cm/s MV E/A ratio:  1.33  Mark Bihari MD Electronically signed by Mark Bihari MD Signature Date/Time: 05/16/2022/2:09:37 PM    Final   TEE  ECHO TEE 08/17/2023  Narrative TRANSESOPHOGEAL ECHO REPORT    Patient Name:   [name] [facility] Date of Exam: 08/17/2023 Medical Rec #:  [id number]    Height:       67.0 in Accession #:    [id number]   Weight:       219.2 lb Date of Birth:  August 14, 1960    BSA:          2.103 m Patient Age:    63 years     BP:           112/79 mmHg Patient Gender: M            HR:           102 bpm. Exam Location:  Inpatient  Procedure: Transesophageal Echo, Cardiac Doppler and Limited Color Doppler (Both Spectral and Color Flow Doppler were utilized during procedure).  Indications:  Atrial Fibrillation  History:         Patient has prior history of Echocardiogram examinations, most recent 05/16/2022. CAD, Prior CABG; Risk Factors:Diabetes, Dyslipidemia and Hypertension.  Sonographer:     Koleen Popper RDCS Sonographer#2:   Philomena Daring RDCS Referring Phys:  [id number] ROSABEL D [name] Diagnosing Phys: Mark Maxcy MD  PROCEDURE: After discussion of the risks and benefits of a TEE, an informed consent was obtained from the patient. The transesophogeal probe was passed without difficulty through the esophogus of the patient. Imaged were obtained with the patient in a supine position. Sedation performed by different physician. The patient was monitored while under deep sedation. Anesthestetic sedation was provided intravenously by Anesthesiology: 250mg  of Propofol , 100mg  of Lidocaine . The patient developed no complications during the procedure. A successful direct current cardioversion was performed at 200 joules with 1 attempt.  IMPRESSIONS   1. Left ventricular ejection fraction, by estimation, is 60  to 65%. The left ventricle has normal function. There is mild left ventricular hypertrophy. 2. Right ventricular systolic function is mildly reduced. The right ventricular size is normal. 3. Left atrial size was mildly dilated. No left atrial/left atrial appendage thrombus was detected. 4. The mitral valve is abnormal. Mild mitral valve regurgitation. 5. The aortic valve is tricuspid. Aortic valve regurgitation is not visualized. Aortic valve sclerosis is present, with no evidence of aortic valve stenosis.  Conclusion(s)/Recommendation(s): No LA/LAA thrombus identified. Successful cardioversion performed with restoration of normal sinus rhythm.  FINDINGS Left Ventricle: Left ventricular ejection fraction, by estimation, is 60 to 65%. The left ventricle has normal function. The left ventricular internal cavity size was normal in size. There is mild left ventricular hypertrophy.  Right Ventricle: The right ventricular size is normal. No increase in right ventricular wall thickness. Right ventricular systolic function is mildly reduced.  Left Atrium: Left atrial size was mildly dilated. No left atrial/left atrial appendage thrombus was detected.  Right Atrium: Right atrial size was normal in size.  Pericardium: There is no evidence of pericardial effusion.  Mitral Valve: The mitral valve is abnormal. Mild mitral valve regurgitation, with centrally-directed jet.  Tricuspid Valve: The tricuspid valve is grossly normal. Tricuspid valve regurgitation is trivial.  Aortic Valve: The aortic valve is tricuspid. Aortic valve regurgitation is not visualized. Aortic valve sclerosis is present, with no evidence of aortic valve stenosis.  Pulmonic Valve: The pulmonic valve was normal in structure. Pulmonic valve regurgitation is not visualized.  Aorta: The ascending aorta was not well visualized.  IAS/Shunts: No atrial level shunt detected by color flow Doppler.  Additional Comments: Spectral Doppler  performed.  Mark Maxcy MD Electronically signed by Mark Maxcy MD Signature Date/Time: 08/17/2023/3:07:48 PM    Final        ______________________________________________________________________________________________       Current Reported Medications:.    Current Meds  Medication Sig   amiodarone  (PACERONE ) 200 MG tablet Take 1 tablet (200 mg total) by mouth daily.   apixaban  (ELIQUIS ) 5 MG TABS tablet Take 1 tablet (5 mg total) by mouth 2 (two) times daily.   atorvastatin  (LIPITOR ) 80 MG tablet Take 1 tablet (80 mg total) by mouth daily.   Cinacalcet HCl (SENSIPAR PO) Take 60 mg by mouth.   cycloSPORINE  (RESTASIS ) 0.05 % ophthalmic emulsion Place 1 drop into both eyes 2 (two) times daily.   LOPERAMIDE  HCL PO Take 2 mg by mouth.   loteprednol  (LOTEMAX ) 0.5 % ophthalmic suspension Place 1 drop into both eyes daily.   Methoxy PEG-Epoetin Beta (  MIRCERA IJ) Mircera   Methoxy PEG-Epoetin Beta (MIRCERA IJ) 120 mcg.   Methoxy PEG-Epoetin Beta (MIRCERA IJ) Mircera   Methoxy PEG-Epoetin Beta (MIRCERA IJ) 75 mcg.   multivitamin (RENA-VIT) TABS tablet Take 1 tablet by mouth.   sevelamer  carbonate (RENVELA ) 800 MG tablet Take 1,600 mg by mouth.    Physical Exam:    VS:  BP 108/64   Pulse (!) 116   Ht 5' 7 (1.702 m)   Wt 223 lb (101.2 kg)   SpO2 98%   BMI 34.93 kg/m    Wt Readings from Last 3 Encounters:  09/04/23 223 lb (101.2 kg)  08/29/23 219 lb 2.2 oz (99.4 kg)  08/27/23 220 lb 7.4 oz (100 kg)    GEN: Well nourished, well developed in no acute distress NECK: No JVD; No carotid bruits CARDIAC: Irregular rate irregular rhythm, no murmurs, rubs, gallops RESPIRATORY:  Clear to auscultation without rales, wheezing or rhonchi  ABDOMEN: Soft, non-tender, non-distended EXTREMITIES:  No edema; No acute deformity     Asessement and Plan:.    Persistent atrial fibrillation/flutter/Qtc prolongation: Patient with history of paroxysmal atrial fibrillation.  Patient  presented on 08/12/2023 and atrial flutter, patient reported heart rates in the 120s to 130s at home, had previously not been compliant with Eliquis , amiodarone  or metoprolol , has started taking medications more consistently 2 weeks prior to appointment, per patient heart rate was sustaining in the 130s.  Patient underwent TEE with cardioversion on 08/17/2023 with successful cardioversion with restoration of normal sinus rhythm.  Patient reports that 2 days following the procedure he noted that his heart rate was in the 120s, then settled around 110 bpm.  He denies any chest pain, significant shortness of breath or palpitations, reports that he overall feels very well.  Patient reports that he has continued amiodarone  200 mg twice daily since 7/2, did not realize that he needed to decrease to once daily.  On chart review patient presented to the ED for prolonged bleeding from his fistula, EKG indicated ectopic atrial rhythm with nonspecific intraventricular conduction delay, QTc 559.  EKG today indicates atrial flutter with intraventricular conduction delay, QTc 571.  Patient denies any dizziness, lightheadedness, presyncope or syncope, and again reports that he feels well overall.  Instructed patient to avoid any QT prolonging medications such as Zofran  or loperamide , and reduce amiodarone  to 200 mg once daily.  Reviewed ED precautions in detail.  Discussed with Dr. [place], DOD at [facility] office today, he recommended continuing amiodarone  at 200 mg daily with urgent referral to EP given quick return to atrial flutter and QT prolongation. Given soft blood pressure will not resume metoprolol  at this time.  Patient denies any bleeding problems on Eliquis .  Continue Eliquis  5 mg twice daily. Check CBC, BMET, mag and TSH today.   ERSD: On dialysis on Tuesday, Thursday and Saturday.  Patient endorses mild bilateral lower extremity edema, on exam is nonpitting, he will notify his dialysis center as may need to  consider increasing fluid removal.   CAD: History of non-STEMI in 2022 with multivessel disease on cardiac catheterization.  S/p CABG in August 2023.  Post bypass course was complicated with 67-month hospitalization between August and October 2023 notable for cardiogenic shock, progression of end-stage renal disease, pericardial effusion and pleural effusion.  Patient denies any chest pain or significant shortness of breath, notes that if he walks for prolonged periods of time he will become mildly short of breath, denies any significant changes.  TEE as noted  above overall reassuring with EF 60 to 65% and normal wall motion.  Reviewed ED precautions.  Hyperlipidemia: Last lipid profile on 08/10/2023 indicated total cholesterol 97, triglycerides 91, HDL 40 and LDL 40.  Continue Lipitor  80 mg daily.     Disposition: F/u with electrophysiology team, next available appointment, Dr. Wendel in 2-3 months.   Signed, Shota Kohrs D Jovonni Borquez, NP

## 2023-09-04 ENCOUNTER — Encounter: Payer: Self-pay | Admitting: Cardiology

## 2023-09-04 ENCOUNTER — Ambulatory Visit: Attending: Cardiology | Admitting: Cardiology

## 2023-09-04 VITALS — BP 108/64 | HR 116 | Ht 67.0 in | Wt 223.0 lb

## 2023-09-04 DIAGNOSIS — N186 End stage renal disease: Secondary | ICD-10-CM | POA: Diagnosis not present

## 2023-09-04 DIAGNOSIS — I4819 Other persistent atrial fibrillation: Secondary | ICD-10-CM | POA: Diagnosis not present

## 2023-09-04 DIAGNOSIS — I2510 Atherosclerotic heart disease of native coronary artery without angina pectoris: Secondary | ICD-10-CM

## 2023-09-04 DIAGNOSIS — I4892 Unspecified atrial flutter: Secondary | ICD-10-CM

## 2023-09-04 DIAGNOSIS — E782 Mixed hyperlipidemia: Secondary | ICD-10-CM

## 2023-09-04 NOTE — Patient Instructions (Addendum)
 Medication Instructions:  NO CHANGES *If you need a refill on your cardiac medications before your next appointment, please call your pharmacy*  Lab Work: CBC,BMET,MAGNESIUM , AND TSH TODAY If you have labs (blood work) drawn today and your tests are completely normal, you will receive your results only by: MyChart Message (if you have MyChart) OR A paper copy in the mail If you have any lab test that is abnormal or we need to change your treatment, we will call you to review the results.  Testing/Procedures: NO TESTING  Follow-Up: At [facility], you and your health needs are our priority.  As part of our continuing mission to provide you with exceptional heart care, our providers are all part of one team.  This team includes your primary Cardiologist (physician) and Advanced Practice Providers or APPs (Physician Assistants and Nurse Practitioners) who all work together to provide you with the care you need, when you need it.  Your next appointment:   3-4 month(s)  Provider:   Arun K Thukkani, MD   Other Instructions You have been referred to ELECTROPHYSIOLOGY FOR ATRIAL FLUTTER

## 2023-09-05 DIAGNOSIS — E876 Hypokalemia: Secondary | ICD-10-CM | POA: Diagnosis not present

## 2023-09-05 DIAGNOSIS — E1122 Type 2 diabetes mellitus with diabetic chronic kidney disease: Secondary | ICD-10-CM | POA: Diagnosis not present

## 2023-09-05 DIAGNOSIS — Z992 Dependence on renal dialysis: Secondary | ICD-10-CM | POA: Diagnosis not present

## 2023-09-05 DIAGNOSIS — D631 Anemia in chronic kidney disease: Secondary | ICD-10-CM | POA: Diagnosis not present

## 2023-09-05 DIAGNOSIS — E8770 Fluid overload, unspecified: Secondary | ICD-10-CM | POA: Diagnosis not present

## 2023-09-05 DIAGNOSIS — N186 End stage renal disease: Secondary | ICD-10-CM | POA: Diagnosis not present

## 2023-09-05 DIAGNOSIS — N2581 Secondary hyperparathyroidism of renal origin: Secondary | ICD-10-CM | POA: Diagnosis not present

## 2023-09-05 LAB — TSH: TSH: 0.51 u[IU]/mL (ref 0.450–4.500)

## 2023-09-05 LAB — BASIC METABOLIC PANEL WITH GFR
BUN/Creatinine Ratio: 6 — ABNORMAL LOW (ref 10–24)
BUN: 48 mg/dL — ABNORMAL HIGH (ref 8–27)
CO2: 25 mmol/L (ref 20–29)
Calcium: 7.8 mg/dL — ABNORMAL LOW (ref 8.6–10.2)
Chloride: 93 mmol/L — ABNORMAL LOW (ref 96–106)
Creatinine, Ser: 7.61 mg/dL — ABNORMAL HIGH (ref 0.76–1.27)
Glucose: 102 mg/dL — ABNORMAL HIGH (ref 70–99)
Potassium: 3.9 mmol/L (ref 3.5–5.2)
Sodium: 138 mmol/L (ref 134–144)
eGFR: 7 mL/min/1.73 — ABNORMAL LOW

## 2023-09-05 LAB — CBC
Hematocrit: 33.1 % — ABNORMAL LOW (ref 37.5–51.0)
Hemoglobin: 10.4 g/dL — ABNORMAL LOW (ref 13.0–17.7)
MCH: 29.8 pg (ref 26.6–33.0)
MCHC: 31.4 g/dL — ABNORMAL LOW (ref 31.5–35.7)
MCV: 95 fL (ref 79–97)
Platelets: 204 x10E3/uL (ref 150–450)
RBC: 3.49 x10E6/uL — ABNORMAL LOW (ref 4.14–5.80)
RDW: 15.8 % — ABNORMAL HIGH (ref 11.6–15.4)
WBC: 10.1 x10E3/uL (ref 3.4–10.8)

## 2023-09-05 LAB — MAGNESIUM: Magnesium: 2.1 mg/dL (ref 1.6–2.3)

## 2023-09-07 ENCOUNTER — Encounter (HOSPITAL_COMMUNITY)
Admission: RE | Disposition: A | Discharge status: Home / Self Care | Payer: Self-pay | Source: Ambulatory Visit | Attending: Vascular Surgery

## 2023-09-07 ENCOUNTER — Ambulatory Visit: Payer: Self-pay | Admitting: Cardiology

## 2023-09-07 ENCOUNTER — Ambulatory Visit (HOSPITAL_COMMUNITY)
Admission: RE | Admit: 2023-09-07 | Discharge: 2023-09-07 | Disposition: A | Discharge status: Home / Self Care | Source: Ambulatory Visit | Attending: Vascular Surgery | Admitting: Vascular Surgery

## 2023-09-07 ENCOUNTER — Other Ambulatory Visit: Payer: Self-pay

## 2023-09-07 DIAGNOSIS — Y832 Surgical operation with anastomosis, bypass or graft as the cause of abnormal reaction of the patient, or of later complication, without mention of misadventure at the time of the procedure: Secondary | ICD-10-CM | POA: Insufficient documentation

## 2023-09-07 DIAGNOSIS — I120 Hypertensive chronic kidney disease with stage 5 chronic kidney disease or end stage renal disease: Secondary | ICD-10-CM | POA: Insufficient documentation

## 2023-09-07 DIAGNOSIS — E1122 Type 2 diabetes mellitus with diabetic chronic kidney disease: Secondary | ICD-10-CM | POA: Insufficient documentation

## 2023-09-07 DIAGNOSIS — Z992 Dependence on renal dialysis: Secondary | ICD-10-CM | POA: Diagnosis not present

## 2023-09-07 DIAGNOSIS — Z87891 Personal history of nicotine dependence: Secondary | ICD-10-CM | POA: Insufficient documentation

## 2023-09-07 DIAGNOSIS — N186 End stage renal disease: Secondary | ICD-10-CM | POA: Insufficient documentation

## 2023-09-07 DIAGNOSIS — T82838A Hemorrhage of vascular prosthetic devices, implants and grafts, initial encounter: Secondary | ICD-10-CM | POA: Insufficient documentation

## 2023-09-07 DIAGNOSIS — T82858A Stenosis of vascular prosthetic devices, implants and grafts, initial encounter: Secondary | ICD-10-CM | POA: Insufficient documentation

## 2023-09-07 DIAGNOSIS — T82898A Other specified complication of vascular prosthetic devices, implants and grafts, initial encounter: Secondary | ICD-10-CM

## 2023-09-07 LAB — GLUCOSE, CAPILLARY: Glucose-Capillary: 98 mg/dL (ref 70–99)

## 2023-09-07 SURGERY — A/V SHUNT INTERVENTION
Anesthesia: LOCAL | Laterality: Right

## 2023-09-07 MED ORDER — HEPARIN (PORCINE) IN NACL 1000-0.9 UT/500ML-% IV SOLN
INTRAVENOUS | Status: DC | PRN
  Administered 2023-09-07: 500 mL

## 2023-09-07 MED ORDER — LIDOCAINE HCL (PF) 1 % IJ SOLN
INTRAMUSCULAR | Status: AC
  Filled 2023-09-07: qty 30

## 2023-09-07 MED ORDER — IODIXANOL 320 MG/ML IV SOLN
INTRAVENOUS | Status: DC | PRN
  Administered 2023-09-07: 25 mL via INTRAVENOUS

## 2023-09-07 MED ORDER — LIDOCAINE HCL (PF) 1 % IJ SOLN
INTRAMUSCULAR | Status: DC | PRN
  Administered 2023-09-07: 2 mL via SUBCUTANEOUS

## 2023-09-07 SURGICAL SUPPLY — 10 items
BALLOON MUSTANG 7.0X40 75 (BALLOONS) IMPLANT
KIT ENCORE 26 ADVANTAGE (KITS) IMPLANT
KIT MICROPUNCTURE NIT STIFF (SHEATH) IMPLANT
MAT PREVALON FULL STRYKER (MISCELLANEOUS) IMPLANT
SHEATH PINNACLE R/O II 6F 4CM (SHEATH) IMPLANT
SHEATH PROBE COVER 6X72 (BAG) IMPLANT
STOPCOCK MORSE 400PSI 3WAY (MISCELLANEOUS) IMPLANT
TRAY PV CATH (CUSTOM PROCEDURE TRAY) ×2 IMPLANT
TUBING CIL FLEX 10 FLL-RA (TUBING) IMPLANT
WIRE BENTSON .035X145CM (WIRE) IMPLANT

## 2023-09-07 NOTE — Op Note (Signed)
 DATE OF SERVICE: 09/07/2023  PATIENT:  [name]  63 y.o. male  PRE-OPERATIVE DIAGNOSIS:  end-stage renal disease; prolonged bleeding after dialysis  POST-OPERATIVE DIAGNOSIS:  Same  PROCEDURE:   1) Ultrasound guided right arm AV fistula access (CPT [phone]) 2) right arm fistulagram with peripheral angioplasty (CPT [phone]) 3) established outpatient evaluation and management - level 3 (CPT 99213)  SURGEON:  Debby SAILOR. Magda, MD  ASSISTANT: none  ANESTHESIA:   local  ESTIMATED BLOOD LOSS: minimal  LOCAL MEDICATIONS USED:  LIDOCAINE    COUNTS: confirmed correct.  PATIENT DISPOSITION:  PACU - hemodynamically stable.   Delay start of Pharmacological VTE agent (>24hrs) due to surgical blood loss or risk of bleeding: no  INDICATION FOR PROCEDURE: [name] is a 63 y.o. male with end-stage renal disease dialyzing through a right arm brachiobasilic AV fistula.  The patient has noted prolonged bleeding from the access.  He has presented to hospital for the same.. After careful discussion of risks, benefits, and alternatives the patient was offered fistulogram with possible intervention. The patient understood and wished to proceed.  OPERATIVE FINDINGS:  Right upper extremity extremity Central venous: No stenosis Subclavian vein: No stenosis Axillary vein: No stenosis Fistula: Mild stenosis in the swing segment of the brachiobasilic fistula as it enters the deep system in the upper arm.  Visually estimated about 60% stenosis.  There is aneurysmal degeneration of the mid fistula in the area of chronic cannulation.  Peripheral to this there is no significant stenosis Anastomosis: Patent without stenosis  DESCRIPTION OF PROCEDURE: After identification of the patient in the pre-operative holding area, the patient was transferred to the operating room. The patient was positioned supine on the operating room table.  The right upper extremity was prepped and draped in standard fashion. A  surgical pause was performed confirming correct patient, procedure, and operative location.  The right upper extremity was anesthetized with subcutaneous injection of 1% lidocaine  over the area of planned access. Using ultrasound guidance, the right arm dialysis access was accessed with micropuncture technique.  Fistulogram was performed in stations with the micro sheath.  See above for details.  The decision was made to intervene.  The lesion was crossed with a Bentson wire.  Access was upsized to 6 [place].  Angioplasty was performed of the stenotic segment with a 7 x 40 mm Mustang balloon.  About 10% residual stenosis was noted after angioplasty.  Thrill was improved in the arm.  All endovascular equipment was removed.  A figure-of-eight stitch was applied to the exit site with good hemostasis.  Sterile bandage was applied.  Upon completion of the case instrument and sharps counts were confirmed correct. The patient was transferred to the  PACU in good condition. I was present for all portions of the procedure.  PLAN: Okay to use fistula.  If patient continues to have issues with prolonged bleeding, we will need to consider angioplasty and covered stenting of this area.  Debby SAILOR. Magda, MD [facility] Phone Number: [phone] 09/07/2023 1:06 PM

## 2023-09-07 NOTE — H&P (Signed)
 VASCULAR AND VEIN SPECIALISTS OF   ASSESSMENT / PLAN: 63 y.o. male with end-stage renal disease dialyzing through a right arm brachiobasilic AV fistula.  He was referred to outpatient dialysis center for fistulogram to evaluate prolonged bleeding after dialysis.  Plan to do this today in the Cath Lab.  CHIEF COMPLAINT: Prolonged bleeding  HISTORY OF PRESENT ILLNESS: [name] is a 63 y.o. male with end-stage renal disease dialyzing through a right arm brachiocephalic AV fistula.  He is referred to the outpatient dialysis center for evaluation of prolonged bleeding after dialysis.  Patient reports he did present to the hospital for the same.    Past Medical History:  Diagnosis Date   AKI (acute kidney injury) (HCC)    pt unaware of this   Anginal pain (HCC)    CAD (coronary artery disease)    a. 03/2015 NSTEMI: LHC with severe 3V CAD  (70% mid RCA, 95% OM1, 90% distal LCx, 90% OM3, 80% prox LAD and 90% ost D1) s/p DES to mLAD w/ small dissction Rx with DES, staged ost Ramus PCI/DES and dLCx s/p PCI/DES    Chest pain 12/24/2020   Diabetes mellitus type 2 in obese    Diverticulosis    Dyspnea    Dyspnea on exertion 03/16/2015   Dyspnea on exertion   Family history of adverse reaction to anesthesia    patient father- pt states after anesthesia his father developed dementia   GERD (gastroesophageal reflux disease)    Hypercholesteremia    Hypertension associated with diabetes (HCC) 03/16/2015   hypertension   NSTEMI (non-ST elevated myocardial infarction) (HCC) 03/17/2015   Obesity    Stroke (HCC) 2022   pt states he had a mini stroke during cardiac catheterization   Tobacco abuse    TTHS- [address]     Past Surgical History:  Procedure Laterality Date   APPLICATION OF WOUND VAC N/A 10/03/2021   Procedure: APPLICATION OF WOUND VAC;  Surgeon: Obadiah Coy, MD;  [facility]: [facility] OR;  Service: Thoracic;  Laterality: N/A;   AV FISTULA PLACEMENT Right 03/14/2022    Procedure: RIGHT ARM ARTERIOVENOUS (AV) FISTULA VERSUS ARTERIOVENOUS GRAFT CREATION;  Surgeon: Serene Gaile ORN, MD;  Location: [facility] OR;  Service: Vascular;  Laterality: Right;   BASCILIC VEIN TRANSPOSITION Right 09/26/2022   Procedure: RIGHT ARM SECOND STAGE BASILIC VEIN TRANSPOSITION;  Surgeon: Serene Gaile ORN, MD;  Location: [facility] OR;  Service: Vascular;  Laterality: Right;   CANNULATION FOR ECMO (EXTRACORPOREAL MEMBRANE OXYGENATION) N/A 09/29/2021   Procedure: CANNULATION FOR ECMO (EXTRACORPOREAL MEMBRANE OXYGENATION);  Surgeon: Shyrl Linnie KIDD, MD;  Location: [facility] OR;  Service: Open Heart Surgery;  Laterality: N/A;   CARDIAC CATHETERIZATION N/A 03/19/2015   Procedure: Left Heart Cath and Coronary Angiography;  Surgeon: Dorn JINNY Lesches, MD;  Location: [facility] INVASIVE CV LAB;  Service: Cardiovascular;  Laterality: N/A;   CARDIAC CATHETERIZATION N/A 03/20/2015   Procedure: Coronary Stent Intervention;  Surgeon: Dorn JINNY Lesches, MD;  [facility]: [facility] INVAS[facility] CV LAB;  Service: Cardiovascular;  Laterality: N/A;   CARDIAC CATHETERIZATION N/A 03/22/2015   Procedure: Coronary Stent Intervention;  Surgeon: Dorn JINNY Lesches, MD;  [facility]: [facility] INVASIVE CV LAB;  Service: Cardiovascular;  Laterality: N/A;   CARDIOVERSION N/A 08/17/2023   Procedure: CARDIOVERSION;  Surgeon: Mona Vinie BROCKS, MD;  [facility]: [facility] INVASIVE CV LAB;  Service: Cardiovascular;  Laterality: N/A;   CORONARY ARTERY BYPASS GRAFT N/A 09/12/2021   Procedure: CORONARY ARTERY BYPASS GRAFTING (CABG) X 5 USING LEFT INTERNAL MAMMARY ARTERY AND  ENDOSCOPICALLY HARVESTED RIGHT GREATER SAPHENOUS VEIN.;  Surgeon: Lucas Dorise POUR, MD;  Location: [facility] OR;  Service: Open Heart Surgery;  Laterality: N/A;   EXPLORATION POST OPERATIVE OPEN HEART N/A 09/30/2021   Procedure: EXPLORATION POST OPERATIVE OPEN HEART WASHOUT;  Surgeon: Shyrl Linnie KIDD, MD;  [facility];  Service: Open Heart Surgery;  Laterality: N/A;   IR FLUORO GUIDE CV LINE LEFT  11/04/2021   IR  FLUORO GUIDE CV LINE LEFT  12/06/2021   IR FLUORO GUIDE CV LINE LEFT  04/11/2022   IR GASTROSTOMY TUBE MOD SED  11/15/2021   IR GASTROSTOMY TUBE MOD SED  11/20/2021   IR GASTROSTOMY TUBE REMOVAL  01/20/2022   IR US  GUIDE VASC ACCESS LEFT  11/04/2021   IR VENOCAVAGRAM SVC  12/06/2021   IR VENOCAVAGRAM SVC  04/11/2022   LEFT HEART CATH AND CORONARY ANGIOGRAPHY N/A 12/25/2020   Procedure: LEFT HEART CATH AND CORONARY ANGIOGRAPHY;  Surgeon: Burnard Debby LABOR, MD;  [facility]: [facility] INVASIVE CV LAB;  Service: Cardiovascular;  Laterality: N/A;   LEFT HEART CATH AND CORONARY ANGIOGRAPHY N/A 12/26/2020   Procedure: LEFT HEART CATH AND CORONARY ANGIOGRAPHY;  Surgeon: Burnard Debby LABOR, MD;  [facility]: [facility] INVASIVE CV LAB;  Service: Cardiovascular;  Laterality: N/A;   MEDIASTINAL EXPLORATION N/A 10/03/2021   Procedure: MEDIASTINAL WASHOUT;  Surgeon: Obadiah Coy, MD;  [facility];  Service: Thoracic;  Laterality: N/A;  PUMP STANDBY   MEDIASTINAL EXPLORATION N/A 10/12/2021   Procedure: MEDIASTINAL WASHOUT;  Surgeon: Lucas Dorise POUR, MD;  [facility];  Service: Thoracic;  Laterality: N/A;   PERCUTANEOUS TRACHEOSTOMY N/A 10/17/2021   Procedure: PERCUTANEOUS TRACHEOSTOMY USING SHILEY FLEXIBLE 8 mm CUFFED TRACH.;  Surgeon: Claudene Toribio BROCKS, MD;  Location[facility] OR;  Service: Pulmonary;  Laterality: N/A;  Percutaneous tracheostomy   PERICARDIAL WINDOW Right 09/28/2021   Procedure: PERICARDIAL WINDOW;  Surgeon: Shyrl Linnie KIDD, MD;  [facility];  Service: Thoracic;  Laterality: Right;  Right VATS.  Lazy lateral.  double lumen ET tube   PLACEMENT OF IMPELLA LEFT VENTRICULAR ASSIST DEVICE N/A 10/07/2021   Procedure: INSERTION OF RIGHT AXILLARY IMPELLA, DECANNULATION OF ECMO, AND MEDIASTINAL WASHOUT;  Surgeon: Lucas Dorise POUR, MD;  [facility];  Service: Open Heart Surgery;  Laterality: N/A;  Open right axillary with 8 mm Hemashield Platinum Vascular graft.   RADIOLOGY WITH ANESTHESIA N/A 11/20/2021   Procedure:  G-Tube Placement;  Surgeon: Adele Rush, MD;  Location: [facility] OR;  Service: Radiology;  Laterality: N/A;   REMOVAL OF IMPELLA LEFT VENTRICULAR ASSIST DEVICE Right 10/17/2021   Procedure: REMOVAL OF IMPELLA LEFT VENTRICULAR ASSIST DEVICE;  Surgeon: Lucas Dorise POUR, MD;  [facility];  Service: Open Heart Surgery;  Laterality: Right;   STERNAL CLOSURE N/A 10/12/2021   Procedure: STERNAL CLOSURE;  Surgeon: Lucas Dorise POUR, MD;  [facility];  Service: Thoracic;  Laterality: N/A;   TEE WITHOUT CARDIOVERSION N/A 09/12/2021   Procedure: TRANSESOPHAGEAL ECHOCARDIOGRAM (TEE);  Surgeon: Lucas Dorise POUR, MD;  Location: [facility] OR;  Service: Open Heart Surgery;  Laterality: N/A;   TEE WITHOUT CARDIOVERSION N/A 09/29/2021   Procedure: TRANSESOPHAGEAL ECHOCARDIOGRAM (TEE);  Surgeon: Shyrl Linnie KIDD, MD;  Location: [facility] OR;  Service: Open Heart Surgery;  Laterality: N/A;   TEE WITHOUT CARDIOVERSION N/A 10/03/2021   Procedure: TRANSESOPHAGEAL ECHOCARDIOGRAM (TEE);  Surgeon: Obadiah Coy, MD;  Location: [facility] OR;  Service: Thoracic;  Laterality: N/A;   TEE WITHOUT CARDIOVERSION  10/07/2021   Procedure: TRANSESOPHAGEAL ECHOCARDIOGRAM (TEE);  Surgeon: Lucas,  Dorise POUR, MD;  [facility];  Service: Open Heart Surgery;;   TEE WITHOUT CARDIOVERSION N/A 10/12/2021   Procedure: TRANSESOPHAGEAL ECHOCARDIOGRAM (TEE);  Surgeon: Lucas Dorise POUR, MD;  Location: [facility] OR;  Service: Thoracic;  Laterality: N/A;   TEE WITHOUT CARDIOVERSION N/A 10/17/2021   Procedure: TRANSESOPHAGEAL ECHOCARDIOGRAM (TEE);  Surgeon: Lucas Dorise POUR, MD;  Location: [facility] OR;  Service: Open Heart Surgery;  Laterality: N/A;   TESTICLE SURGERY  1970   pt states testicle was ascended and had to be pulled down   TONSILLECTOMY     as a child   TRANSESOPHAGEAL ECHOCARDIOGRAM (CATH LAB) N/A 08/17/2023   Procedure: TRANSESOPHAGEAL ECHOCARDIOGRAM;  Surgeon: Mona Vinie BROCKS, MD;  Location: [facility] INVASIVE CV LAB;  Service: Cardiovascular;  Laterality: N/A;    No family  history on file.  Social History   Socioeconomic History   Marital status: Legally Separated    Spouse name: Not on file   Number of children: 0   Years of education: Not on file   Highest education level: Bachelor's degree (e.g., BA, AB, BS)  Occupational History   Not on file  Tobacco Use   Smoking status: Former    Current packs/day: 0.00    Types: Cigarettes    Quit date: 2017    Years since quitting: 8.5   Smokeless tobacco: Never  Vaping Use   Vaping status: Former   Start date: 10/14/2017   Quit date: 09/09/2021  Substance and Sexual Activity   Alcohol  use: Not Currently    Comment: very occasional, maybe a beer or mixed drink once every few months   Drug use: Not Currently   Sexual activity: Not on file  Other Topics Concern   Not on file  Social History Narrative   Right handed   Unemployed   Lives with uncle   No caffeine   Two story home   Social Drivers of Health   Financial Resource Strain: Low Risk  (04/27/2023)   Overall Financial Resource Strain (CARDIA)    Difficulty of Paying Living Expenses: Not hard at all  Food Insecurity: No Food Insecurity (04/27/2023)   Hunger Vital Sign    Worried About Running Out of Food in the Last Year: Never true    Ran Out of Food in the Last Year: Never true  Transportation Needs: No Transportation Needs (04/27/2023)   PRAPARE - Administrator, Civil Service (Medical): No    Lack of Transportation (Non-Medical): No  Physical Activity: Unknown (04/27/2023)   Exercise Vital Sign    Days of Exercise per Week: 0 days    Minutes of Exercise per Session: Not on file  Stress: No Stress Concern Present (04/27/2023)   [employer] of Occupational Health - Occupational Stress Questionnaire    Feeling of Stress : Not at all  Social Connections: Socially Isolated (04/27/2023)   Social Connection and Isolation Panel    Frequency of Communication with Friends and Family: More than three times a week    Frequency of  Social Gatherings with Friends and Family: Twice a week    Attends Religious Services: Never    Database administrator or Organizations: No    Attends Engineer, structural: Not on file    Marital Status: Separated  Intimate Partner Violence: Not At Risk (12/06/2021)   Humiliation, Afraid, Rape, and Kick questionnaire    Fear of Current or Ex-Partner: No    Emotionally Abused: No    Physically Abused: No  Sexually Abused: No    No Known Allergies  No current facility-administered medications for this encounter.   Current Outpatient Medications  Medication Sig Dispense Refill   amiodarone  (PACERONE ) 200 MG tablet Take 1 tablet (200 mg total) by mouth daily. 90 tablet 3   apixaban  (ELIQUIS ) 5 MG TABS tablet Take 1 tablet (5 mg total) by mouth 2 (two) times daily. 180 tablet 3   atorvastatin  (LIPITOR ) 80 MG tablet Take 1 tablet (80 mg total) by mouth daily. 90 tablet 3   Cinacalcet HCl (SENSIPAR PO) Take 60 mg by mouth.     cycloSPORINE  (RESTASIS ) 0.05 % ophthalmic emulsion Place 1 drop into both eyes 2 (two) times daily. 60 each 6   LOPERAMIDE  HCL PO Take 2 mg by mouth.     loteprednol  (LOTEMAX ) 0.5 % ophthalmic suspension Place 1 drop into both eyes daily. 5 mL 3   Methoxy PEG-Epoetin Beta (MIRCERA IJ) Mircera     Methoxy PEG-Epoetin Beta (MIRCERA IJ) 120 mcg.     Methoxy PEG-Epoetin Beta (MIRCERA IJ) Mircera     Methoxy PEG-Epoetin Beta (MIRCERA IJ) 75 mcg.     multivitamin (RENA-VIT) TABS tablet Take 1 tablet by mouth.     sevelamer  carbonate (RENVELA ) 800 MG tablet Take 1,600 mg by mouth.      PHYSICAL EXAM Vitals:   09/07/23 1216 09/07/23 1221 09/07/23 1230 09/07/23 1236  BP: 118/84 115/86 110/72 110/72  Pulse: (!) 112 (!) 111 (!) 114 (!) 111  Resp: 20 (!) 26 12 16   Temp:      TempSrc:      SpO2: 98% 98% 97% 97%   Elderly man in no distress Regular rate and rhythm Unlabored breathing Right brachiobasilic AV fistula with slightly pulsatile  thrill  PERTINENT LABORATORY AND RADIOLOGIC DATA  Most recent CBC    Latest Ref Rng & Units 09/04/2023    3:57 PM 08/29/2023   11:26 AM 08/17/2023    1:08 PM  CBC  WBC 3.4 - 10.8 x10E3/uL 10.1  9.7    Hemoglobin 13.0 - 17.7 g/dL 89.5  9.8  88.0   Hematocrit 37.5 - 51.0 % 33.1  31.2  35.0   Platelets 150 - 450 x10E3/uL 204  170       Most recent CMP    Latest Ref Rng & Units 09/04/2023    3:57 PM 08/29/2023   12:00 PM 08/17/2023    1:08 PM  CMP  Glucose 70 - 99 mg/dL 897  884  888   BUN 8 - 27 mg/dL 48  51  42   Creatinine 0.76 - 1.27 mg/dL 2.38  1.78  0.79   Sodium 134 - 144 mmol/L 138  137  135   Potassium 3.5 - 5.2 mmol/L 3.9  3.6  3.5   Chloride 96 - 106 mmol/L 93  94  95   CO2 20 - 29 mmol/L 25  25    Calcium  8.6 - 10.2 mg/dL 7.8  8.4      Renal function Estimated Creatinine Clearance: 11.3 mL/min (A) (by C-G formula based on SCr of 7.61 mg/dL (H)).  Hemoglobin A1C (%)  Date Value  04/29/2023 6.2 (A)   HbA1c, POC (controlled diabetic range) (%)  Date Value  07/12/2021 7.6 (A)   Hgb A1c MFr Bld (%)  Date Value  09/10/2021 6.5 (H)    LDL Chol Calc (NIH)  Date Value Ref Range Status  04/29/2023 64 0 - 99 mg/dL Final   Direct LDL  Date  Value Ref Range Status  03/12/2021 114.3 (H) 0 - 99 mg/dL Final    Comment:    Performed at [facility], [address] [place], [place] [address]    Debby SAILOR. Magda, MD FACS [facility] Phone Number: [phone] 09/07/2023 1:03 PM   Total time spent on preparing this encounter including chart review, data review, collecting history, examining the patient, and coordinating care: 30 min  Portions of this report may have been transcribed using voice recognition software.  Every effort has been made to ensure accuracy; however, inadvertent computerized transcription errors may still be present.

## 2023-09-08 ENCOUNTER — Encounter (HOSPITAL_COMMUNITY): Payer: Self-pay | Admitting: Vascular Surgery

## 2023-09-08 DIAGNOSIS — E8770 Fluid overload, unspecified: Secondary | ICD-10-CM | POA: Diagnosis not present

## 2023-09-08 DIAGNOSIS — N2581 Secondary hyperparathyroidism of renal origin: Secondary | ICD-10-CM | POA: Diagnosis not present

## 2023-09-08 DIAGNOSIS — Z992 Dependence on renal dialysis: Secondary | ICD-10-CM | POA: Diagnosis not present

## 2023-09-08 DIAGNOSIS — D631 Anemia in chronic kidney disease: Secondary | ICD-10-CM | POA: Diagnosis not present

## 2023-09-08 DIAGNOSIS — E1122 Type 2 diabetes mellitus with diabetic chronic kidney disease: Secondary | ICD-10-CM | POA: Diagnosis not present

## 2023-09-08 DIAGNOSIS — N186 End stage renal disease: Secondary | ICD-10-CM | POA: Diagnosis not present

## 2023-09-08 DIAGNOSIS — E876 Hypokalemia: Secondary | ICD-10-CM | POA: Diagnosis not present

## 2023-09-10 DIAGNOSIS — D631 Anemia in chronic kidney disease: Secondary | ICD-10-CM | POA: Diagnosis not present

## 2023-09-10 DIAGNOSIS — E1129 Type 2 diabetes mellitus with other diabetic kidney complication: Secondary | ICD-10-CM | POA: Diagnosis not present

## 2023-09-10 DIAGNOSIS — Z992 Dependence on renal dialysis: Secondary | ICD-10-CM | POA: Diagnosis not present

## 2023-09-10 DIAGNOSIS — N186 End stage renal disease: Secondary | ICD-10-CM | POA: Diagnosis not present

## 2023-09-10 DIAGNOSIS — E876 Hypokalemia: Secondary | ICD-10-CM | POA: Diagnosis not present

## 2023-09-10 DIAGNOSIS — E8770 Fluid overload, unspecified: Secondary | ICD-10-CM | POA: Diagnosis not present

## 2023-09-10 DIAGNOSIS — N2581 Secondary hyperparathyroidism of renal origin: Secondary | ICD-10-CM | POA: Diagnosis not present

## 2023-09-10 DIAGNOSIS — E1122 Type 2 diabetes mellitus with diabetic chronic kidney disease: Secondary | ICD-10-CM | POA: Diagnosis not present

## 2023-09-10 NOTE — Telephone Encounter (Signed)
-----   Message from Rosabel BIRCH [place] sent at 09/07/2023 12:36 PM EDT ----- Please let Mr. Recupero know that his hemoglobin and hematocrit are stable, there is no evidence of infection. His electrolytes are overall normal, his calcium  is mildly decreased, his magnesium  level  is normal. Please send these results to his nephrologist so they are aware of low calcium  levels. Please also remind him he should not be taking loperamide  at this time.  ----- Message ----- From: Rebecka Memos Lab Results In Sent: 09/04/2023  11:36 PM EDT To: Katlyn D West, NP

## 2023-09-10 NOTE — Telephone Encounter (Signed)
 Left message to call back

## 2023-09-11 ENCOUNTER — Telehealth: Payer: Self-pay

## 2023-09-11 NOTE — Telephone Encounter (Signed)
 Please review message below and advise   Copied from CRM #[id number]. Topic: Clinical - Medical Advice >> Sep 11, 2023  1:20 PM [name] wrote: Reason for CRM: pt called saying his blood pressure has been lower the last couple of days.  90/70.  He is wondering if needs to lower his blood pressure medication

## 2023-09-11 NOTE — Telephone Encounter (Signed)
 Pt is returning a call to nurse for results

## 2023-09-12 DIAGNOSIS — D631 Anemia in chronic kidney disease: Secondary | ICD-10-CM | POA: Diagnosis not present

## 2023-09-12 DIAGNOSIS — Z992 Dependence on renal dialysis: Secondary | ICD-10-CM | POA: Diagnosis not present

## 2023-09-12 DIAGNOSIS — N2581 Secondary hyperparathyroidism of renal origin: Secondary | ICD-10-CM | POA: Diagnosis not present

## 2023-09-12 DIAGNOSIS — N186 End stage renal disease: Secondary | ICD-10-CM | POA: Diagnosis not present

## 2023-09-12 DIAGNOSIS — E8779 Other fluid overload: Secondary | ICD-10-CM | POA: Diagnosis not present

## 2023-09-12 DIAGNOSIS — E876 Hypokalemia: Secondary | ICD-10-CM | POA: Diagnosis not present

## 2023-09-15 DIAGNOSIS — E876 Hypokalemia: Secondary | ICD-10-CM | POA: Diagnosis not present

## 2023-09-15 DIAGNOSIS — E8779 Other fluid overload: Secondary | ICD-10-CM | POA: Diagnosis not present

## 2023-09-15 DIAGNOSIS — N2581 Secondary hyperparathyroidism of renal origin: Secondary | ICD-10-CM | POA: Diagnosis not present

## 2023-09-15 DIAGNOSIS — N186 End stage renal disease: Secondary | ICD-10-CM | POA: Diagnosis not present

## 2023-09-15 DIAGNOSIS — D631 Anemia in chronic kidney disease: Secondary | ICD-10-CM | POA: Diagnosis not present

## 2023-09-15 DIAGNOSIS — Z992 Dependence on renal dialysis: Secondary | ICD-10-CM | POA: Diagnosis not present

## 2023-09-15 NOTE — Telephone Encounter (Signed)
 Tried to call pt to advise, no answer, LVM

## 2023-09-16 ENCOUNTER — Encounter: Payer: Self-pay | Admitting: Podiatry

## 2023-09-16 ENCOUNTER — Ambulatory Visit: Admitting: Podiatry

## 2023-09-16 DIAGNOSIS — E8779 Other fluid overload: Secondary | ICD-10-CM | POA: Diagnosis not present

## 2023-09-16 DIAGNOSIS — B351 Tinea unguium: Secondary | ICD-10-CM

## 2023-09-16 DIAGNOSIS — M79674 Pain in right toe(s): Secondary | ICD-10-CM | POA: Diagnosis not present

## 2023-09-16 DIAGNOSIS — N2581 Secondary hyperparathyroidism of renal origin: Secondary | ICD-10-CM | POA: Diagnosis not present

## 2023-09-16 DIAGNOSIS — N186 End stage renal disease: Secondary | ICD-10-CM | POA: Diagnosis not present

## 2023-09-16 DIAGNOSIS — M79675 Pain in left toe(s): Secondary | ICD-10-CM

## 2023-09-16 DIAGNOSIS — E876 Hypokalemia: Secondary | ICD-10-CM | POA: Diagnosis not present

## 2023-09-16 DIAGNOSIS — Z992 Dependence on renal dialysis: Secondary | ICD-10-CM | POA: Diagnosis not present

## 2023-09-16 DIAGNOSIS — D631 Anemia in chronic kidney disease: Secondary | ICD-10-CM | POA: Diagnosis not present

## 2023-09-17 DIAGNOSIS — Z992 Dependence on renal dialysis: Secondary | ICD-10-CM | POA: Diagnosis not present

## 2023-09-17 DIAGNOSIS — N186 End stage renal disease: Secondary | ICD-10-CM | POA: Diagnosis not present

## 2023-09-17 DIAGNOSIS — D631 Anemia in chronic kidney disease: Secondary | ICD-10-CM | POA: Diagnosis not present

## 2023-09-17 DIAGNOSIS — N2581 Secondary hyperparathyroidism of renal origin: Secondary | ICD-10-CM | POA: Diagnosis not present

## 2023-09-17 DIAGNOSIS — E8779 Other fluid overload: Secondary | ICD-10-CM | POA: Diagnosis not present

## 2023-09-17 DIAGNOSIS — E876 Hypokalemia: Secondary | ICD-10-CM | POA: Diagnosis not present

## 2023-09-18 NOTE — Progress Notes (Signed)
 Subjective:   Patient ID: [name], male   DOB: 63 y.o.   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  Reappoint routine care

## 2023-09-19 DIAGNOSIS — E876 Hypokalemia: Secondary | ICD-10-CM | POA: Diagnosis not present

## 2023-09-19 DIAGNOSIS — E8779 Other fluid overload: Secondary | ICD-10-CM | POA: Diagnosis not present

## 2023-09-19 DIAGNOSIS — D631 Anemia in chronic kidney disease: Secondary | ICD-10-CM | POA: Diagnosis not present

## 2023-09-19 DIAGNOSIS — Z992 Dependence on renal dialysis: Secondary | ICD-10-CM | POA: Diagnosis not present

## 2023-09-19 DIAGNOSIS — N2581 Secondary hyperparathyroidism of renal origin: Secondary | ICD-10-CM | POA: Diagnosis not present

## 2023-09-19 DIAGNOSIS — N186 End stage renal disease: Secondary | ICD-10-CM | POA: Diagnosis not present

## 2023-09-22 DIAGNOSIS — N2581 Secondary hyperparathyroidism of renal origin: Secondary | ICD-10-CM | POA: Diagnosis not present

## 2023-09-22 DIAGNOSIS — Z992 Dependence on renal dialysis: Secondary | ICD-10-CM | POA: Diagnosis not present

## 2023-09-22 DIAGNOSIS — D631 Anemia in chronic kidney disease: Secondary | ICD-10-CM | POA: Diagnosis not present

## 2023-09-22 DIAGNOSIS — E876 Hypokalemia: Secondary | ICD-10-CM | POA: Diagnosis not present

## 2023-09-22 DIAGNOSIS — N186 End stage renal disease: Secondary | ICD-10-CM | POA: Diagnosis not present

## 2023-09-22 DIAGNOSIS — E8779 Other fluid overload: Secondary | ICD-10-CM | POA: Diagnosis not present

## 2023-09-24 DIAGNOSIS — D631 Anemia in chronic kidney disease: Secondary | ICD-10-CM | POA: Diagnosis not present

## 2023-09-24 DIAGNOSIS — E8779 Other fluid overload: Secondary | ICD-10-CM | POA: Diagnosis not present

## 2023-09-24 DIAGNOSIS — N2581 Secondary hyperparathyroidism of renal origin: Secondary | ICD-10-CM | POA: Diagnosis not present

## 2023-09-24 DIAGNOSIS — E876 Hypokalemia: Secondary | ICD-10-CM | POA: Diagnosis not present

## 2023-09-24 DIAGNOSIS — N186 End stage renal disease: Secondary | ICD-10-CM | POA: Diagnosis not present

## 2023-09-24 DIAGNOSIS — Z992 Dependence on renal dialysis: Secondary | ICD-10-CM | POA: Diagnosis not present

## 2023-09-25 ENCOUNTER — Ambulatory Visit: Attending: Cardiovascular Disease | Admitting: Cardiovascular Disease

## 2023-09-25 ENCOUNTER — Encounter: Payer: Self-pay | Admitting: Cardiovascular Disease

## 2023-09-25 ENCOUNTER — Other Ambulatory Visit: Payer: Self-pay

## 2023-09-25 VITALS — BP 94/64 | HR 123 | Ht 67.0 in | Wt 225.0 lb

## 2023-09-25 DIAGNOSIS — I4819 Other persistent atrial fibrillation: Secondary | ICD-10-CM | POA: Diagnosis not present

## 2023-09-25 DIAGNOSIS — I4892 Unspecified atrial flutter: Secondary | ICD-10-CM

## 2023-09-25 NOTE — H&P (View-Only) (Signed)
 Electrophysiology Office Note:    Date:  09/25/2023   ID:  [name], DOB [birth year], MRN [id number]  PCP:  Tanda Bleacher, MD   [facility] Providers Cardiologist:  Lurena MARLA Red, MD Electrophysiologist:  Eulas FORBES Furbish, MD     Referring MD: West, Katlyn D, NP   History of Present Illness:    [name] is a 64 y.o. male with a medical history significant for atrial flutter, CHF with preserved EF, CAD status post CABG (very complicated postoperative course), referred for management of atrial flutter.      Discussed the use of AI scribe software for clinical note transcription with the patient, who gave verbal consent to proceed.  History of Present Illness [name] is a 63 year old male with atrial flutter who presents with tachycardia and fluid retention.  Atrial flutter was initially identified in April 2024 with symptoms of tachycardia, chest pain, and shortness of breath. An EKG showed atrial flutter with a heart rate of 143 bpm. He underwent TEE-guided cardioversion on August 17, 2023, but experienced recurrence and was started on amiodarone  10 mg twice daily. He is confused about his medication regimen, particularly regarding amiodarone  post-procedure.  His cardiac history includes coronary artery disease with bypass surgery in August 2023, complicated by postoperative atrial fibrillation, tamponade, cardiac arrest, and VA ECMO support. He has been treated with amiodarone  for atrial fibrillation and has experienced bradycardia and PEA arrest.  He is experiencing weight gain of 8-9 pounds every couple of weeks, suggesting fluid retention. He is on dialysis, and the team is cautious about fluid removal due to low blood pressure. He has not experienced chest tightness or pain since receiving oxygen  during dialysis sessions.  His medication regimen includes Eliquis  twice daily. He is uncertain about his current use of amiodarone  and metoprolol .          Today, he reports that he remains fatigued, easily short of breath.  EKGs/Labs/Other Studies Reviewed Today:     Echocardiogram:  TEE August 17, 2023 LVEF 60 to 65%.  Mildly dilated left atrium.  Mild mitral valve regurgitation.  Aortic valve sclerosis without stenosis.    EKG:   EKG Interpretation Date/Time:  Friday September 25 2023 14:55:22 EDT Ventricular Rate:  123 PR Interval:    QRS Duration:  108 QT Interval:  280 QTC Calculation: 400 R Axis:   99  Text Interpretation: Atrial flutter with 2:1 A-V conduction Rightward axis Marked ST abnormality, possible inferior subendocardial injury When compared with ECG of 04-Sep-2023 14:50, Atrial flutter has replaced Sinus rhythm Confirmed by Furbish Eulas [phone]) on 09/25/2023 3:41:08 PM     Physical Exam:    VS:  BP 94/64 (BP Location: Left Arm, Patient Position: Sitting, Cuff Size: Large)   Pulse (!) 123   Ht 5' 7 (1.702 m)   Wt 225 lb (102.1 kg)   SpO2 93%   BMI 35.24 kg/m     Wt Readings from Last 3 Encounters:  09/25/23 225 lb (102.1 kg)  09/04/23 223 lb (101.2 kg)  08/29/23 219 lb 2.2 oz (99.4 kg)     GEN: Well nourished, well developed in no acute distress CARDIAC: tachy, regular rhythm, no murmurs, rubs, gallops RESPIRATORY:  Normal work of breathing MUSCULOSKELETAL: tense edema    ASSESSMENT & PLAN:     Atrial flutter Highly symptomatic with shortness of breath Failed amiodarone  Appearance on ECG is somewhat atypical -- I would plan to use Carto for this procedure Continue  amiodarone  for now  We discussed the indication, rationale, logistics, anticipated benefits, and potential risks of the ablation procedure including but not limited to -- bleed at the groin access site, chest pain, damage to nearby organs such as the diaphragm, lungs, or esophagus, need for a drainage tube, or prolonged hospitalization. I explained that the risk for stroke, heart attack, need for open chest surgery, or even death is  very low but not zero. he  expressed understanding and wishes to proceed.   CHFpEF With volume overload today --  Volume control with dialysis   End-stage renal disease Would preferably have more volume removed, but this has been limited by hypotension and tachycardia  Coronary artery disease History of non-STEMI in 2022, multi vessel coronary disease CABG August 2023--complicated postop course Symptoms of dyspnea today attributable to atrial flutter with RVR   Signed, Eulas FORBES Furbish, MD  09/25/2023 4:05 PM    [facility]

## 2023-09-25 NOTE — Progress Notes (Signed)
 Electrophysiology Office Note:    Date:  09/25/2023   ID:  [name], DOB [birth year], MRN [id number]  PCP:  Tanda Bleacher, MD   [facility] Providers Cardiologist:  Lurena MARLA Red, MD Electrophysiologist:  Eulas FORBES Furbish, MD     Referring MD: West, Katlyn D, NP   History of Present Illness:    [name] is a 64 y.o. male with a medical history significant for atrial flutter, CHF with preserved EF, CAD status post CABG (very complicated postoperative course), referred for management of atrial flutter.      Discussed the use of AI scribe software for clinical note transcription with the patient, who gave verbal consent to proceed.  History of Present Illness [name] is a 63 year old male with atrial flutter who presents with tachycardia and fluid retention.  Atrial flutter was initially identified in April 2024 with symptoms of tachycardia, chest pain, and shortness of breath. An EKG showed atrial flutter with a heart rate of 143 bpm. He underwent TEE-guided cardioversion on August 17, 2023, but experienced recurrence and was started on amiodarone  10 mg twice daily. He is confused about his medication regimen, particularly regarding amiodarone  post-procedure.  His cardiac history includes coronary artery disease with bypass surgery in August 2023, complicated by postoperative atrial fibrillation, tamponade, cardiac arrest, and VA ECMO support. He has been treated with amiodarone  for atrial fibrillation and has experienced bradycardia and PEA arrest.  He is experiencing weight gain of 8-9 pounds every couple of weeks, suggesting fluid retention. He is on dialysis, and the team is cautious about fluid removal due to low blood pressure. He has not experienced chest tightness or pain since receiving oxygen  during dialysis sessions.  His medication regimen includes Eliquis  twice daily. He is uncertain about his current use of amiodarone  and metoprolol .          Today, he reports that he remains fatigued, easily short of breath.  EKGs/Labs/Other Studies Reviewed Today:     Echocardiogram:  TEE August 17, 2023 LVEF 60 to 65%.  Mildly dilated left atrium.  Mild mitral valve regurgitation.  Aortic valve sclerosis without stenosis.    EKG:   EKG Interpretation Date/Time:  Friday September 25 2023 14:55:22 EDT Ventricular Rate:  123 PR Interval:    QRS Duration:  108 QT Interval:  280 QTC Calculation: 400 R Axis:   99  Text Interpretation: Atrial flutter with 2:1 A-V conduction Rightward axis Marked ST abnormality, possible inferior subendocardial injury When compared with ECG of 04-Sep-2023 14:50, Atrial flutter has replaced Sinus rhythm Confirmed by Furbish Eulas [phone]) on 09/25/2023 3:41:08 PM     Physical Exam:    VS:  BP 94/64 (BP Location: Left Arm, Patient Position: Sitting, Cuff Size: Large)   Pulse (!) 123   Ht 5' 7 (1.702 m)   Wt 225 lb (102.1 kg)   SpO2 93%   BMI 35.24 kg/m     Wt Readings from Last 3 Encounters:  09/25/23 225 lb (102.1 kg)  09/04/23 223 lb (101.2 kg)  08/29/23 219 lb 2.2 oz (99.4 kg)     GEN: Well nourished, well developed in no acute distress CARDIAC: tachy, regular rhythm, no murmurs, rubs, gallops RESPIRATORY:  Normal work of breathing MUSCULOSKELETAL: tense edema    ASSESSMENT & PLAN:     Atrial flutter Highly symptomatic with shortness of breath Failed amiodarone  Appearance on ECG is somewhat atypical -- I would plan to use Carto for this procedure Continue  amiodarone  for now  We discussed the indication, rationale, logistics, anticipated benefits, and potential risks of the ablation procedure including but not limited to -- bleed at the groin access site, chest pain, damage to nearby organs such as the diaphragm, lungs, or esophagus, need for a drainage tube, or prolonged hospitalization. I explained that the risk for stroke, heart attack, need for open chest surgery, or even death is  very low but not zero. he  expressed understanding and wishes to proceed.   CHFpEF With volume overload today --  Volume control with dialysis   End-stage renal disease Would preferably have more volume removed, but this has been limited by hypotension and tachycardia  Coronary artery disease History of non-STEMI in 2022, multi vessel coronary disease CABG August 2023--complicated postop course Symptoms of dyspnea today attributable to atrial flutter with RVR   Signed, Eulas FORBES Furbish, MD  09/25/2023 4:05 PM    [facility]

## 2023-09-25 NOTE — Patient Instructions (Addendum)
 Medication Instructions:  Your physician recommends that you continue on your current medications as directed. Please refer to the Current Medication list given to you today.  *If you need a refill on your cardiac medications before your next appointment, please call your pharmacy*  Testing/Procedures: Ablation Your physician has recommended that you have an ablation. Catheter ablation is a medical procedure used to treat some cardiac arrhythmias (irregular heartbeats). During catheter ablation, a long, thin, flexible tube is put into a blood vessel in your groin (upper thigh), or neck. This tube is called an ablation catheter. It is then guided to your heart through the blood vessel. Radio frequency waves destroy small areas of heart tissue where abnormal heartbeats may cause an arrhythmia to start.   What To Expect:  Labs: you will need to have lab work drawn within 30 days of your procedure. Please go to any [facility] location to have these drawn - no appointment is needed. You will receive procedure instructions either through MyChart or in the mail 4-6 week prior to your procedure.  After your procedure we recommend no driving for 3 days, no lifting over 10 lbs for 5 days, and no work or strenuous activity for 7 days.  Please contact our office at [phone] if you have any questions.    Follow-Up: We will contact you to schedule your post-procedure appointments.

## 2023-09-26 DIAGNOSIS — E876 Hypokalemia: Secondary | ICD-10-CM | POA: Diagnosis not present

## 2023-09-26 DIAGNOSIS — E8779 Other fluid overload: Secondary | ICD-10-CM | POA: Diagnosis not present

## 2023-09-26 DIAGNOSIS — N186 End stage renal disease: Secondary | ICD-10-CM | POA: Diagnosis not present

## 2023-09-26 DIAGNOSIS — D631 Anemia in chronic kidney disease: Secondary | ICD-10-CM | POA: Diagnosis not present

## 2023-09-26 DIAGNOSIS — N2581 Secondary hyperparathyroidism of renal origin: Secondary | ICD-10-CM | POA: Diagnosis not present

## 2023-09-26 DIAGNOSIS — Z992 Dependence on renal dialysis: Secondary | ICD-10-CM | POA: Diagnosis not present

## 2023-09-26 LAB — CBC
Hematocrit: 32.9 % — ABNORMAL LOW (ref 37.5–51.0)
Hemoglobin: 10.6 g/dL — ABNORMAL LOW (ref 13.0–17.7)
MCH: 30 pg (ref 26.6–33.0)
MCHC: 32.2 g/dL (ref 31.5–35.7)
MCV: 93 fL (ref 79–97)
Platelets: 184 x10E3/uL (ref 150–450)
RBC: 3.53 x10E6/uL — ABNORMAL LOW (ref 4.14–5.80)
RDW: 16 % — ABNORMAL HIGH (ref 11.6–15.4)
WBC: 9.4 x10E3/uL (ref 3.4–10.8)

## 2023-09-26 LAB — BASIC METABOLIC PANEL WITH GFR
BUN/Creatinine Ratio: 5 — ABNORMAL LOW (ref 10–24)
BUN: 35 mg/dL — ABNORMAL HIGH (ref 8–27)
CO2: 26 mmol/L (ref 20–29)
Calcium: 7.9 mg/dL — ABNORMAL LOW (ref 8.6–10.2)
Chloride: 93 mmol/L — ABNORMAL LOW (ref 96–106)
Creatinine, Ser: 7.63 mg/dL — ABNORMAL HIGH (ref 0.76–1.27)
Glucose: 100 mg/dL — ABNORMAL HIGH (ref 70–99)
Potassium: 3.8 mmol/L (ref 3.5–5.2)
Sodium: 139 mmol/L (ref 134–144)
eGFR: 7 mL/min/1.73 — ABNORMAL LOW (ref >59)

## 2023-09-27 ENCOUNTER — Ambulatory Visit: Payer: Self-pay | Admitting: Cardiovascular Disease

## 2023-09-29 ENCOUNTER — Telehealth: Payer: Self-pay | Admitting: Cardiovascular Disease

## 2023-09-29 DIAGNOSIS — E8779 Other fluid overload: Secondary | ICD-10-CM | POA: Diagnosis not present

## 2023-09-29 DIAGNOSIS — N186 End stage renal disease: Secondary | ICD-10-CM | POA: Diagnosis not present

## 2023-09-29 DIAGNOSIS — D631 Anemia in chronic kidney disease: Secondary | ICD-10-CM | POA: Diagnosis not present

## 2023-09-29 DIAGNOSIS — Z992 Dependence on renal dialysis: Secondary | ICD-10-CM | POA: Diagnosis not present

## 2023-09-29 DIAGNOSIS — E876 Hypokalemia: Secondary | ICD-10-CM | POA: Diagnosis not present

## 2023-09-29 DIAGNOSIS — N2581 Secondary hyperparathyroidism of renal origin: Secondary | ICD-10-CM | POA: Diagnosis not present

## 2023-09-29 NOTE — Telephone Encounter (Signed)
 Danica with [employer] called in to speak with you. She has a couple questions about pt's prior auth for ablation and she said she would have to hear back by 8/22

## 2023-09-30 DIAGNOSIS — E876 Hypokalemia: Secondary | ICD-10-CM | POA: Diagnosis not present

## 2023-09-30 DIAGNOSIS — D631 Anemia in chronic kidney disease: Secondary | ICD-10-CM | POA: Diagnosis not present

## 2023-09-30 DIAGNOSIS — E8779 Other fluid overload: Secondary | ICD-10-CM | POA: Diagnosis not present

## 2023-09-30 DIAGNOSIS — N2581 Secondary hyperparathyroidism of renal origin: Secondary | ICD-10-CM | POA: Diagnosis not present

## 2023-09-30 DIAGNOSIS — N186 End stage renal disease: Secondary | ICD-10-CM | POA: Diagnosis not present

## 2023-09-30 DIAGNOSIS — Z992 Dependence on renal dialysis: Secondary | ICD-10-CM | POA: Diagnosis not present

## 2023-09-30 NOTE — Telephone Encounter (Addendum)
 LM#2 requesting callback for clinic prep outreach. Electronically signed by: Rosina LOISE Dasen, RN 10/01/2023 9:12 AM  LM#1 requesting callback for clinic prep outreach. Electronically signed by: Rosina LOISE Dasen, RN 09/30/2023 9:43 AM  09/30/23 Pre-Transplant Clinic Outreach Call  Findings since EDU:  URO Ref/Elevated PSA.  Needs QuantGold Redrawn.  Needs NEURO OT (SW Concerns)   [name] [address] [place] [address]  [phone]  Demographics not Verified.  Transplant Episode Phase/Status: Kidney Transplant Evaluation - 03/09/2023 - NCBG - NAC earlier 2025 d/t UTC. - R/S 07.2025 AOTP Appts                                               RECENT HEALTH UPDATE(S):   Per [facility] Cards Note 08.15.25: Hx significant for atrial flutter, CHF with preserved EF, CAD status post CABG 8.2023 (very complicated postoperative course), referred for management of atrial flutter.  - ED Visits x 2 in July for issues with controlling bleeding from fistual s/p HD. - Pending Ablation(?) at [facility], Persistent Atrial Fibrillation and Atrial Flutter. - Recent TEEs w/o cardioversion.   Caregiver: Renay Call, Uncle Potential donors: No Patient reports receiving Hep B series.  HBV Vaccine dates on file. 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 Diabetes, currently on insulin   . History of hypertension     Nephrologist:   [name]  Dialysis Chronic Start Date:  10/30/2021   Maintenance Dialysis History      Start End Type Center Comments   12/05/2021  In-center Hemodialysis [facility] TTS         Current Dialysis Center Information     [facility]     Phone: [phone] Fax:    Address: [address] [place] [address]                  Other  Centers: ineligible at [facility] d/t $.   Outside Studies: records in Care Everywhere: [place]: TEE 07.07.25  Hx:  Patient Active Problem List   Diagnosis Date Noted   . Encephalomalacia 09/30/2023  . Obesity 09/30/2023  . AKI (acute kidney injury) 02/23/2023  . Diabetes mellitus type 2 with neurological manifestations    (CMD) 02/23/2023  . Secondary hyperparathyroidism of renal origin (CMD) 08/22/2022  . Aphasia 08/21/2022  . Pericarditis (CMD) 08/21/2022  . Transient neurologic deficit 08/21/2022  . ESRD on dialysis    (CMD) 05/15/2022  . Atrial flutter    (CMD) 05/15/2022  . Cardiac arrest    (CMD) 12/04/2021  . Complication of vascular dialysis catheter 12/04/2021  . Chronic diastolic (congestive) heart failure    (CMD) 11/15/2021  . Personal history of transient ischemic attack (TIA), and cerebral infarction without residual deficits 11/15/2021  . Hypertensive heart and chronic kidney disease with heart failure and with stage 5 chronic kidney disease, or end stage renal disease    (CMD) 11/15/2021  . Personal history of nicotine dependence 11/15/2021  . Type 2 diabetes mellitus with diabetic chronic kidney disease    (CMD) 11/15/2021  . Paroxysmal atrial fibrillation    (CMD) 11/15/2021  . Cardiomyopathy    (CMD) 09/28/2021  . S/P CABG  x 5 09/12/2021  . Dysphasia 03/12/2021  . Type 2 diabetes mellitus (HCC) 03/19/2018  . Hypertension 03/19/2018  . CAD (coronary artery disease) 03/19/2018  . History of MI (myocardial infarction) 03/19/2018  . Hypercholesterolemia 03/19/2018    Past Surgical History: 03/14/2022: AV FISTULA PLACEMENT; Right 12/25/2020: CARDIAC CATHETERIZATION; Left 09/2021: CORONARY ARTERY BYPASS GRAFT No date: IMPELLA INSERTION 11/20/2021: IR PLACEMENT OF GASTRO TUBE 10/02/2021: PERICARDIAL WINDOW 1970: TESTICLE SURGERY No date: TONSILLECTOMY  Upcoming Appointments as of 09/30/23 Scheduled Future Appointments       Provider Department Dept Phone  Center   10/07/2023 9:30 AM [facility] TRANSPLANT MASTER SCHEDULE [facility] Abdominal Organ Transplant [phone] [facility] [place]

## 2023-09-30 NOTE — Pre-Procedure Instructions (Signed)
 Attempted to call patient regarding procedure instructions.  Left voicemail on the following items: Arrival time 0800 Nothing to eat or drink after midnight No meds AM of procedure Responsible person to drive you home and stay with you for 24 hrs  Have you missed any doses of anti-coagulant Eliquis- should be taken twice a day, if you have missed any doses please let us know.  Don't take dose morning of procedure.

## 2023-10-01 ENCOUNTER — Observation Stay (HOSPITAL_COMMUNITY)
Admission: RE | Admit: 2023-10-01 | Discharge: 2023-10-02 | Disposition: A | Discharge status: Home / Self Care | Attending: Internal Medicine | Admitting: Internal Medicine

## 2023-10-01 ENCOUNTER — Inpatient Hospital Stay (HOSPITAL_COMMUNITY)

## 2023-10-01 ENCOUNTER — Other Ambulatory Visit: Payer: Self-pay

## 2023-10-01 ENCOUNTER — Encounter (HOSPITAL_COMMUNITY)
Admission: RE | Disposition: A | Discharge status: Home / Self Care | Source: Home / Self Care | Attending: Critical Care Medicine

## 2023-10-01 ENCOUNTER — Ambulatory Visit (HOSPITAL_BASED_OUTPATIENT_CLINIC_OR_DEPARTMENT_OTHER): Admitting: Anesthesiology

## 2023-10-01 ENCOUNTER — Encounter (HOSPITAL_COMMUNITY): Payer: Self-pay | Admitting: Cardiovascular Disease

## 2023-10-01 ENCOUNTER — Ambulatory Visit (HOSPITAL_COMMUNITY): Admitting: Anesthesiology

## 2023-10-01 DIAGNOSIS — Z79899 Other long term (current) drug therapy: Secondary | ICD-10-CM | POA: Insufficient documentation

## 2023-10-01 DIAGNOSIS — I120 Hypertensive chronic kidney disease with stage 5 chronic kidney disease or end stage renal disease: Secondary | ICD-10-CM

## 2023-10-01 DIAGNOSIS — Z87891 Personal history of nicotine dependence: Secondary | ICD-10-CM | POA: Insufficient documentation

## 2023-10-01 DIAGNOSIS — E8770 Fluid overload, unspecified: Secondary | ICD-10-CM | POA: Insufficient documentation

## 2023-10-01 DIAGNOSIS — I2510 Atherosclerotic heart disease of native coronary artery without angina pectoris: Secondary | ICD-10-CM | POA: Insufficient documentation

## 2023-10-01 DIAGNOSIS — I132 Hypertensive heart and chronic kidney disease with heart failure and with stage 5 chronic kidney disease, or end stage renal disease: Secondary | ICD-10-CM | POA: Diagnosis not present

## 2023-10-01 DIAGNOSIS — R061 Stridor: Secondary | ICD-10-CM | POA: Insufficient documentation

## 2023-10-01 DIAGNOSIS — I517 Cardiomegaly: Secondary | ICD-10-CM | POA: Diagnosis not present

## 2023-10-01 DIAGNOSIS — E1122 Type 2 diabetes mellitus with diabetic chronic kidney disease: Secondary | ICD-10-CM | POA: Insufficient documentation

## 2023-10-01 DIAGNOSIS — Z992 Dependence on renal dialysis: Secondary | ICD-10-CM

## 2023-10-01 DIAGNOSIS — R918 Other nonspecific abnormal finding of lung field: Secondary | ICD-10-CM | POA: Diagnosis not present

## 2023-10-01 DIAGNOSIS — I509 Heart failure, unspecified: Secondary | ICD-10-CM

## 2023-10-01 DIAGNOSIS — J9601 Acute respiratory failure with hypoxia: Secondary | ICD-10-CM

## 2023-10-01 DIAGNOSIS — N186 End stage renal disease: Secondary | ICD-10-CM | POA: Insufficient documentation

## 2023-10-01 DIAGNOSIS — I5033 Acute on chronic diastolic (congestive) heart failure: Principal | ICD-10-CM | POA: Insufficient documentation

## 2023-10-01 DIAGNOSIS — R000 Tachycardia, unspecified: Secondary | ICD-10-CM | POA: Diagnosis present

## 2023-10-01 DIAGNOSIS — R0602 Shortness of breath: Secondary | ICD-10-CM | POA: Diagnosis not present

## 2023-10-01 DIAGNOSIS — E8779 Other fluid overload: Secondary | ICD-10-CM | POA: Diagnosis not present

## 2023-10-01 DIAGNOSIS — I5032 Chronic diastolic (congestive) heart failure: Secondary | ICD-10-CM | POA: Insufficient documentation

## 2023-10-01 DIAGNOSIS — I4892 Unspecified atrial flutter: Secondary | ICD-10-CM | POA: Insufficient documentation

## 2023-10-01 DIAGNOSIS — D631 Anemia in chronic kidney disease: Secondary | ICD-10-CM | POA: Diagnosis not present

## 2023-10-01 DIAGNOSIS — J90 Pleural effusion, not elsewhere classified: Secondary | ICD-10-CM | POA: Diagnosis not present

## 2023-10-01 HISTORY — DX: End stage renal disease: N18.6

## 2023-10-01 LAB — CBC
HCT: 35.3 % — ABNORMAL LOW (ref 39.0–52.0)
Hemoglobin: 11.3 g/dL — ABNORMAL LOW (ref 13.0–17.0)
MCH: 29.8 pg (ref 26.0–34.0)
MCHC: 32 g/dL (ref 30.0–36.0)
MCV: 93.1 fL (ref 80.0–100.0)
Platelets: 181 K/uL (ref 150–400)
RBC: 3.79 MIL/uL — ABNORMAL LOW (ref 4.22–5.81)
RDW: 17.9 % — ABNORMAL HIGH (ref 11.5–15.5)
WBC: 8.4 K/uL (ref 4.0–10.5)
nRBC: 0 % (ref 0.0–0.2)

## 2023-10-01 LAB — BASIC METABOLIC PANEL WITH GFR
Anion gap: 18 — ABNORMAL HIGH (ref 5–15)
BUN: 24 mg/dL — ABNORMAL HIGH (ref 8–23)
CO2: 27 mmol/L (ref 22–32)
Calcium: 8.5 mg/dL — ABNORMAL LOW (ref 8.9–10.3)
Chloride: 98 mmol/L (ref 98–111)
Creatinine, Ser: 6.13 mg/dL — ABNORMAL HIGH (ref 0.61–1.24)
GFR, Estimated: 10 mL/min — ABNORMAL LOW (ref >60)
Glucose, Bld: 130 mg/dL — ABNORMAL HIGH (ref 70–99)
Potassium: 3.9 mmol/L (ref 3.5–5.1)
Sodium: 143 mmol/L (ref 135–145)

## 2023-10-01 LAB — BRAIN NATRIURETIC PEPTIDE: B Natriuretic Peptide: 715.1 pg/mL — ABNORMAL HIGH (ref 0.0–100.0)

## 2023-10-01 LAB — POCT I-STAT, CHEM 8
BUN: 23 mg/dL (ref 8–23)
Calcium, Ion: 0.97 mmol/L — ABNORMAL LOW (ref 1.15–1.40)
Chloride: 95 mmol/L — ABNORMAL LOW (ref 98–111)
Creatinine, Ser: 5.5 mg/dL — ABNORMAL HIGH (ref 0.61–1.24)
Glucose, Bld: 117 mg/dL — ABNORMAL HIGH (ref 70–99)
HCT: 36 % — ABNORMAL LOW (ref 39.0–52.0)
Hemoglobin: 12.2 g/dL — ABNORMAL LOW (ref 13.0–17.0)
Potassium: 3.4 mmol/L — ABNORMAL LOW (ref 3.5–5.1)
Sodium: 138 mmol/L (ref 135–145)
TCO2: 30 mmol/L (ref 22–32)

## 2023-10-01 LAB — HIV ANTIBODY (ROUTINE TESTING W REFLEX): HIV Screen 4th Generation wRfx: NONREACTIVE

## 2023-10-01 LAB — GLUCOSE, CAPILLARY
Glucose-Capillary: 118 mg/dL — ABNORMAL HIGH (ref 70–99)
Glucose-Capillary: 120 mg/dL — ABNORMAL HIGH (ref 70–99)

## 2023-10-01 LAB — HEPATITIS B SURFACE ANTIGEN: Hepatitis B Surface Ag: NONREACTIVE

## 2023-10-01 LAB — MRSA NEXT GEN BY PCR, NASAL: MRSA by PCR Next Gen: NOT DETECTED

## 2023-10-01 MED ORDER — PROPOFOL 10 MG/ML IV BOLUS
INTRAVENOUS | Status: DC | PRN
  Administered 2023-10-01: 180 mg via INTRAVENOUS

## 2023-10-01 MED ORDER — SODIUM CHLORIDE 0.9 % IV SOLN: 250.0000 mL | INTRAVENOUS | Status: DC

## 2023-10-01 MED ORDER — MIDODRINE HCL 5 MG PO TABS
10.0000 mg | ORAL_TABLET | Freq: Once | ORAL | Status: DC
  Filled 2023-10-01: qty 2

## 2023-10-01 MED ORDER — RACEPINEPHRINE HCL 2.25 % IN NEBU
0.5000 mL | INHALATION_SOLUTION | Freq: Once | RESPIRATORY_TRACT | Status: DC
  Administered 2023-10-01: 0.5 mL via RESPIRATORY_TRACT
  Filled 2023-10-01: qty 15

## 2023-10-01 MED ORDER — MIDAZOLAM HCL 2 MG/2ML IJ SOLN
INTRAMUSCULAR | Status: AC
  Filled 2023-10-01: qty 2

## 2023-10-01 MED ORDER — REVEFENACIN 175 MCG/3ML IN SOLN
175.0000 ug | Freq: Every day | RESPIRATORY_TRACT | Status: DC
  Administered 2023-10-01 – 2023-10-02 (×2): 175 ug via RESPIRATORY_TRACT
  Filled 2023-10-01 (×2): qty 3

## 2023-10-01 MED ORDER — CYCLOSPORINE 0.05 % OP EMUL
1.0000 [drp] | Freq: Two times a day (BID) | OPHTHALMIC | Status: DC
  Administered 2023-10-01 – 2023-10-02 (×2): 1 [drp] via OPHTHALMIC
  Filled 2023-10-01 (×2): qty 30

## 2023-10-01 MED ORDER — IPRATROPIUM-ALBUTEROL 0.5-2.5 (3) MG/3ML IN SOLN
3.0000 mL | RESPIRATORY_TRACT | Status: DC | PRN
  Administered 2023-10-01: 3 mL via RESPIRATORY_TRACT
  Filled 2023-10-01: qty 3

## 2023-10-01 MED ORDER — SODIUM CHLORIDE 0.9% FLUSH
3.0000 mL | INTRAVENOUS | Status: DC | PRN
Start: 2023-10-01 — End: 2023-10-02

## 2023-10-01 MED ORDER — RACEPINEPHRINE HCL 2.25 % IN NEBU: 0.5000 mL | INHALATION_SOLUTION | RESPIRATORY_TRACT | Status: DC | PRN

## 2023-10-01 MED ORDER — SODIUM CHLORIDE 0.9 % IV SOLN: INTRAVENOUS | Status: DC

## 2023-10-01 MED ORDER — LIDOCAINE 2% (20 MG/ML) 5 ML SYRINGE
INTRAMUSCULAR | Status: DC | PRN
  Administered 2023-10-01: 60 mg via INTRAVENOUS

## 2023-10-01 MED ORDER — ACETAMINOPHEN 325 MG PO TABS: 650.0000 mg | ORAL_TABLET | Freq: Four times a day (QID) | ORAL | Status: DC | PRN

## 2023-10-01 MED ORDER — LOTEPREDNOL ETABONATE 0.5 % OP SUSP
1.0000 [drp] | Freq: Every day | OPHTHALMIC | Status: DC
  Filled 2023-10-01: qty 5

## 2023-10-01 MED ORDER — SODIUM CHLORIDE 0.9% FLUSH
3.0000 mL | Freq: Two times a day (BID) | INTRAVENOUS | Status: DC
  Administered 2023-10-01 – 2023-10-02 (×3): 3 mL via INTRAVENOUS

## 2023-10-01 MED ORDER — DEXMEDETOMIDINE HCL IN NACL 400 MCG/100ML IV SOLN
INTRAVENOUS | Status: AC
  Filled 2023-10-01: qty 100

## 2023-10-01 MED ORDER — FENTANYL CITRATE (PF) 100 MCG/2ML IJ SOLN
INTRAMUSCULAR | Status: AC
  Filled 2023-10-01: qty 2

## 2023-10-01 MED ORDER — NEPRO/CARBSTEADY PO LIQD: 237.0000 mL | Freq: Three times a day (TID) | ORAL | Status: DC | PRN

## 2023-10-01 MED ORDER — ONDANSETRON HCL 4 MG/2ML IJ SOLN: 4.0000 mg | Freq: Four times a day (QID) | INTRAMUSCULAR | Status: DC | PRN

## 2023-10-01 MED ORDER — AMIODARONE HCL 200 MG PO TABS
200.0000 mg | ORAL_TABLET | Freq: Every day | ORAL | Status: DC
  Filled 2023-10-01: qty 1

## 2023-10-01 MED ORDER — IPRATROPIUM-ALBUTEROL 0.5-2.5 (3) MG/3ML IN SOLN
3.0000 mL | RESPIRATORY_TRACT | Status: DC
  Administered 2023-10-01 – 2023-10-02 (×3): 3 mL via RESPIRATORY_TRACT
  Filled 2023-10-01 (×3): qty 3

## 2023-10-01 MED ORDER — SORBITOL 70 % SOLN: 30.0000 mL | Status: DC | PRN

## 2023-10-01 MED ORDER — PENTAFLUOROPROP-TETRAFLUOROETH EX AERO: 1.0000 | INHALATION_SPRAY | CUTANEOUS | Status: DC | PRN

## 2023-10-01 MED ORDER — ONDANSETRON HCL 4 MG/2ML IJ SOLN
INTRAMUSCULAR | Status: DC | PRN
  Administered 2023-10-01: 4 mg via INTRAVENOUS

## 2023-10-01 MED ORDER — HYDROXYZINE HCL 25 MG PO TABS: 25.0000 mg | ORAL_TABLET | Freq: Three times a day (TID) | ORAL | Status: DC | PRN

## 2023-10-01 MED ORDER — LIDOCAINE HCL (PF) 1 % IJ SOLN
5.0000 mL | INTRAMUSCULAR | Status: DC | PRN
Start: 2023-10-01 — End: 2023-10-02

## 2023-10-01 MED ORDER — NEPRO/CARBSTEADY PO LIQD: 237.0000 mL | ORAL | Status: DC | PRN

## 2023-10-01 MED ORDER — DEXAMETHASONE SODIUM PHOSPHATE 10 MG/ML IJ SOLN
10.0000 mg | Freq: Four times a day (QID) | INTRAMUSCULAR | Status: AC
  Administered 2023-10-01 – 2023-10-02 (×3): 10 mg via INTRAVENOUS
  Filled 2023-10-01 (×3): qty 1

## 2023-10-01 MED ORDER — NOREPINEPHRINE 4 MG/250ML-% IV SOLN: 0.0000 ug/min | INTRAVENOUS | Status: DC

## 2023-10-01 MED ORDER — PHENYLEPHRINE HCL-NACL 20-0.9 MG/250ML-% IV SOLN
INTRAVENOUS | Status: DC | PRN
  Administered 2023-10-01 (×2): 160 ug via INTRAVENOUS
  Administered 2023-10-01: 100 ug/min via INTRAVENOUS
  Administered 2023-10-01: 160 ug via INTRAVENOUS

## 2023-10-01 MED ORDER — IPRATROPIUM-ALBUTEROL 0.5-2.5 (3) MG/3ML IN SOLN: 3.0000 mL | Freq: Once | RESPIRATORY_TRACT | Status: AC

## 2023-10-01 MED ORDER — ACETAMINOPHEN 650 MG RE SUPP: 650.0000 mg | Freq: Four times a day (QID) | RECTAL | Status: DC | PRN

## 2023-10-01 MED ORDER — ROCURONIUM BROMIDE 10 MG/ML (PF) SYRINGE
PREFILLED_SYRINGE | INTRAVENOUS | Status: DC | PRN
  Administered 2023-10-01: 50 mg via INTRAVENOUS

## 2023-10-01 MED ORDER — SUGAMMADEX SODIUM 200 MG/2ML IV SOLN
INTRAVENOUS | Status: DC | PRN
  Administered 2023-10-01 (×2): 200 mg via INTRAVENOUS

## 2023-10-01 MED ORDER — LIDOCAINE-PRILOCAINE 2.5-2.5 % EX CREA: 1.0000 | TOPICAL_CREAM | CUTANEOUS | Status: DC | PRN

## 2023-10-01 MED ORDER — SODIUM CHLORIDE 0.9 % IV SOLN: 250.0000 mL | INTRAVENOUS | Status: DC | PRN

## 2023-10-01 MED ORDER — DOCUSATE SODIUM 283 MG RE ENEM: 1.0000 | ENEMA | RECTAL | Status: DC | PRN

## 2023-10-01 MED ORDER — MIDODRINE HCL 5 MG PO TABS
5.0000 mg | ORAL_TABLET | Freq: Once | ORAL | Status: AC
  Administered 2023-10-01: 5 mg via ORAL
  Filled 2023-10-01 (×2): qty 1

## 2023-10-01 MED ORDER — ALBUMIN HUMAN 25 % IV SOLN
INTRAVENOUS | Status: AC
  Filled 2023-10-01: qty 100

## 2023-10-01 MED ORDER — ANTICOAGULANT SODIUM CITRATE 4% (200MG/5ML) IV SOLN: 5.0000 mL | Status: DC | PRN

## 2023-10-01 MED ORDER — CALCIUM CARBONATE ANTACID 1250 MG/5ML PO SUSP: 500.0000 mg | Freq: Four times a day (QID) | ORAL | Status: DC | PRN

## 2023-10-01 MED ORDER — ALTEPLASE 2 MG IJ SOLR
2.0000 mg | Freq: Once | INTRAMUSCULAR | Status: DC | PRN
Start: 2023-10-01 — End: 2023-10-02

## 2023-10-01 MED ORDER — ACETAMINOPHEN 325 MG PO TABS: 650.0000 mg | ORAL_TABLET | ORAL | Status: DC | PRN

## 2023-10-01 MED ORDER — IPRATROPIUM-ALBUTEROL 0.5-2.5 (3) MG/3ML IN SOLN
RESPIRATORY_TRACT | Status: AC
  Administered 2023-10-01: 3 mL via RESPIRATORY_TRACT
  Filled 2023-10-01: qty 3

## 2023-10-01 MED ORDER — HEPARIN (PORCINE) IN NACL 1000-0.9 UT/500ML-% IV SOLN
INTRAVENOUS | Status: DC | PRN
  Administered 2023-10-01: 500 mL

## 2023-10-01 MED ORDER — FENTANYL CITRATE (PF) 250 MCG/5ML IJ SOLN
INTRAMUSCULAR | Status: DC | PRN
  Administered 2023-10-01: 100 ug via INTRAVENOUS

## 2023-10-01 MED ORDER — CAMPHOR-MENTHOL 0.5-0.5 % EX LOTN: 1.0000 | TOPICAL_LOTION | Freq: Three times a day (TID) | CUTANEOUS | Status: DC | PRN

## 2023-10-01 MED ORDER — ATORVASTATIN CALCIUM 80 MG PO TABS
80.0000 mg | ORAL_TABLET | Freq: Every day | ORAL | Status: DC
Start: 2023-10-02 — End: 2023-10-02
  Administered 2023-10-02: 80 mg via ORAL
  Filled 2023-10-01: qty 1

## 2023-10-01 MED ORDER — APIXABAN 5 MG PO TABS
5.0000 mg | ORAL_TABLET | Freq: Two times a day (BID) | ORAL | Status: DC
  Administered 2023-10-01 – 2023-10-02 (×2): 5 mg via ORAL
  Filled 2023-10-01 (×2): qty 1

## 2023-10-01 MED ORDER — ARFORMOTEROL TARTRATE 15 MCG/2ML IN NEBU
15.0000 ug | INHALATION_SOLUTION | Freq: Two times a day (BID) | RESPIRATORY_TRACT | Status: DC
  Administered 2023-10-01 – 2023-10-02 (×2): 15 ug via RESPIRATORY_TRACT
  Filled 2023-10-01 (×2): qty 2

## 2023-10-01 MED ORDER — ALBUMIN HUMAN 25 % IV SOLN: 25.0000 g | INTRAVENOUS | Status: DC | PRN

## 2023-10-01 MED ORDER — DEXAMETHASONE SODIUM PHOSPHATE 10 MG/ML IJ SOLN
INTRAMUSCULAR | Status: DC | PRN
  Administered 2023-10-01: 10 mg via INTRAVENOUS

## 2023-10-01 MED ORDER — HEPARIN SODIUM (PORCINE) 1000 UNIT/ML DIALYSIS: 1000.0000 [IU] | INTRAMUSCULAR | Status: DC | PRN

## 2023-10-01 MED ORDER — CHLORHEXIDINE GLUCONATE CLOTH 2 % EX PADS: 6.0000 | MEDICATED_PAD | Freq: Every day | CUTANEOUS | Status: DC

## 2023-10-01 NOTE — Anesthesia Procedure Notes (Addendum)
 Procedure Name: Intubation Date/Time: 10/01/2023 9:34 AM  Performed by: Carmita Boom A, CRNAPre-anesthesia Checklist: Patient identified, Emergency Drugs available, Suction available and Patient being monitored Patient Re-evaluated:Patient Re-evaluated prior to induction Oxygen  Delivery Method: Circle System Utilized Preoxygenation: Pre-oxygenation with 100% oxygen  Induction Type: IV induction Ventilation: Mask ventilation without difficulty Laryngoscope Size: McGrath and 4 Grade View: Grade I Tube type: Oral Tube size: 7.0 mm Number of attempts: 2 Airway Equipment and Method: Oral airway and Rigid stylet Placement Confirmation: ETT inserted through vocal cords under direct vision, positive ETCO2 and breath sounds checked- equal and bilateral Secured at: 22 cm Tube secured with: Tape Dental Injury: Teeth and Oropharynx as per pre-operative assessment  Comments: Intubated second attempt with glidescope go pro. Unable to pass tube on first attempt.  Switched from ETT 8 to ETT 7. Small cut noted to lip,

## 2023-10-01 NOTE — Progress Notes (Signed)
 Getting HD, tolerating volume removal. On BiPAP laying flat, adjusted to 10 PS due to lower volumes and slightly increased WOB. He denies complaints at this time.  Will re-dose decadron  for stridor.   Mark SHAUNNA Gaskins, DO 10/01/23 4:32 PM [facility]  For contact information, see Amion. If no response to pager, please call PCCM consult pager. After hours, 7PM- 7AM, please call Elink.

## 2023-10-01 NOTE — Progress Notes (Signed)
 Nurse has repeatedly explained to pt that his bed rest orders are 4 hours. All efforts have been made to make pt more comfortable as he has stated he cannot lay flat for 4 hours. Pillows have been placed underneath knees and lower back. Pt asked to be tilted to one side and that has been done with the use of pillows. Pt repeatedly states that he was not told he would have to lay flat and that the bed rest orders are unreasonable.Offer to have MD come and speak to pt once he is done with his current procedure has been made.

## 2023-10-01 NOTE — Transfer of Care (Signed)
 Immediate Anesthesia Transfer of Care Note  Patient: [name]  Procedure(s) Performed: A-FLUTTER ABLATION  Patient Location: PACU  Anesthesia Type:General  Level of Consciousness: awake  Airway & Oxygen  Therapy: Patient Spontanous Breathing and Patient connected to nasal cannula oxygen   Post-op Assessment: Report given to RN and Post -op Vital signs reviewed and stable  Post vital signs: Reviewed and stable  Last Vitals:  Vitals Value Taken Time  BP 82/61 10/01/23 10:30  Temp    Pulse 76 10/01/23 10:30  Resp 24 10/01/23 10:29  SpO2 92 % 10/01/23 10:30  Vitals shown include unfiled device data.  Last Pain: There were no vitals filed for this visit.       Complications: There were no known notable events for this encounter.

## 2023-10-01 NOTE — Progress Notes (Signed)
 EP Update  The patient has a recent history of persistent atrial flutter with RVR and hypotension, limiting fluid removal during dialysis. He was admitted for a flutter ablation.   Prior to the procedure, he was noted to be floridly volume overloaded though with good respiratory function.   After the procedure, he had difficulty with wheezing and increased respiratory effort.  I decided to admit him to the ICU for urgent dialysis to remove fluid, possibly requiring pressure support as well as for the assistance of pulmonary critical care to manage his respiratory failure.

## 2023-10-01 NOTE — Progress Notes (Signed)
 Pt receives out-pt HD at [facility] on [facility] on TTS 11:50 am chair time. Will assist as needed.   Randine Mungo Dialysis Navigator [phone]

## 2023-10-01 NOTE — Progress Notes (Signed)
 Pt is now yelling and saying he can't breathe and refuses to keep his legs straight. Pt is oxygenating at 98% . All vital signs within normal limits.

## 2023-10-01 NOTE — H&P (Signed)
 NAME:  [name], MRN:  [id number], DOB:  1960-10-04, LOS: 0 ADMISSION DATE:  10/01/2023, CONSULTATION DATE:  10/01/23 REFERRING MD:  Mealor , CHIEF COMPLAINT:  SOB   History of Present Illness:  63 yo M PMH CAD s/p CABG c/b Dresslers (with cardiac tamponade and associated cardiac arrest) requiring VA ECMO, ESRD on HD, who presented to [facility] 10/01/23 for elective aflutter ablation. He was intubated for the case, 8.0 wouldn't pass so ultimately downsized to 7.0, and rcvd decadron . Ablation unremarkable, subsequently extubated.  In recovery, started to have SOB and feeling like he can't take a breath in. He was given duoneb tx, and pccm is consulted in this setting   In chart review looks like his wts have been uptrending and there have been outpt concerns re soft blood pressures limiting HD   Pertinent  Medical History  CAD Dresslers VA ECMO  Cardiac arrest Aflutter ESRD Hx tracheostomy   Significant Hospital Events: Including procedures, antibiotic start and stop dates in addition to other pertinent events   8/21 elective cardioversion. Resp distress after, admit to ICU for BiPAP   Interim History / Subjective:   Met anesthesiologist at bedside, pt feeling like he can't take a breath   Objective    Blood pressure 102/76, pulse 86, temperature 98 F (36.7 C), temperature source Oral, resp. rate (!) 21, height 5' 7 (1.702 m), weight 99.8 kg, SpO2 99%.    FiO2 (%):  [50 %] 50 % PEEP:  [5 cmH20] 5 cmH20 Pressure Support:  [5 cmH20] 5 cmH20   Intake/Output Summary (Last 24 hours) at 10/01/2023 1325 Last data filed at 10/01/2023 1030 Gross per 24 hour  Intake 250 ml  Output --  Net 250 ml   Filed Weights   10/01/23 0807  Weight: 99.8 kg    Examination: General: chronically ill appearing M  HENT: NCAT. Pink mm. Healed trach scar  Lungs: Turbulent sounds + some rales. 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 His vascular access history is extensive, think it would likely be difficult to place a temp cath for CRRT  -think adding midodrine  is reasonable   HFpEF Hx CAD s/p CABG Hx Dresslers Hx cardiac tamponade Hx cardiac arrest Hx VA ECMO  P -lipitor   -checking a bnp  -volume removal via hd    Best Practice (right click and Reselect all SmartList Selections daily)   Diet/type: NPO w/ oral meds DVT prophylaxis DOAC Pressure ulcer(s): pressure ulcer assessment deferred  GI prophylaxis: N/A Lines: N/A Foley:  N/A Code Status:  full code Last date of multidisciplinary goals of care discussion [--]  Labs   CBC: Recent Labs  Lab 09/25/23 1629 10/01/23 0842  WBC 9.4  --   HGB 10.6* 12.2*  HCT 32.9* 36.0*  MCV 93  --   PLT 184  --     Basic  Metabolic Panel: Recent Labs  Lab 09/25/23 1629 10/01/23 0842  NA 139 138  K 3.8 3.4*  CL 93* 95*  CO2 26  --   GLUCOSE 100* 117*  BUN 35* 23  CREATININE 7.63* 5.50*  CALCIUM  7.9*  --    GFR: Estimated Creatinine Clearance: 15.5 mL/min (A) (by C-G formula based on SCr of 5.5 mg/dL (H)). Recent Labs  Lab 09/25/23 1629  WBC 9.4    Liver Function Tests: No results for input(s): AST, ALT, ALKPHOS, BILITOT, PROT, ALBUMIN  in the last 168 hours. No results for input(s): LIPASE, AMYLASE in the last 168 hours. No results for input(s): AMMONIA in the last 168  hours.  ABG    Component Value Date/Time   PHART 7.301 (L) 10/22/2021 2354   PCO2ART 38.5 10/22/2021 2354   PO2ART 73 (L) 10/22/2021 2354   HCO3 19.3 (L) 10/22/2021 2354   TCO2 30 10/01/2023 0842   ACIDBASEDEF 7.0 (H) 10/22/2021 2354   O2SAT 71.7 11/02/2021 0439     Coagulation Profile: No results for input(s): INR, PROTIME in the last 168 hours.  Cardiac Enzymes: No results for input(s): CKTOTAL, CKMB, CKMBINDEX, TROPONINI in the last 168 hours.  HbA1C: Hemoglobin A1C  Date/Time Value Ref Range Status  04/29/2023 02:10 PM 6.2 (A) 4.0 - 5.6 % Final  06/06/2022 10:26 AM 6.0 (A) 4.0 - 5.6 % Final   HbA1c, POC (controlled diabetic range)  Date/Time Value Ref Range Status  07/12/2021 02:38 PM 7.6 (A) 0.0 - 7.0 % Final   HbA1c POC (<> result, manual entry)  Date/Time Value Ref Range Status  02/19/2021 10:02 AM >15 4.0 - 5.6 % Final    Comment:    Greater than 15   Hgb A1c MFr Bld  Date/Time Value Ref Range Status  09/10/2021 02:06 PM 6.5 (H) 4.8 - 5.6 % Final    Comment:    (NOTE) Pre diabetes:          5.7%-6.4%  Diabetes:              >6.4%  Glycemic control for   <7.0% adults with diabetes   04/12/2021 03:00 PM 14.4 (H) 4.8 - 5.6 % Final    Comment:    (NOTE) Pre diabetes:          5.7%-6.4%  Diabetes:              >6.4%  Glycemic control for   <7.0% adults with diabetes     CBG: Recent Labs  Lab 10/01/23 0811 10/01/23 1041  GLUCAP 118* 120*    Review of Systems:   Review of Systems  Respiratory:  Positive for shortness of breath and wheezing.   Cardiovascular:  Positive for leg swelling.  Psychiatric/Behavioral:  The patient is nervous/anxious.      Past Medical History:  He,  has a past medical history of AKI (acute kidney injury) (HCC), Anginal pain (HCC), CAD (coronary artery disease), Chest pain (12/24/2020), Diabetes mellitus type 2 in obese, Diverticulosis, Dyspnea, Dyspnea on exertion (03/16/2015), Family history of adverse  reaction to anesthesia, GERD (gastroesophageal reflux disease), Hypercholesteremia, Hypertension associated with diabetes (HCC) (03/16/2015), NSTEMI (non-ST elevated myocardial infarction) (HCC) (03/17/2015), Obesity, Stroke (HCC) (2022), Tobacco abuse, and TTHS- [address].   Surgical History:   Past Surgical History:  Procedure Laterality Date   A/V SHUNT INTERVENTION Right 09/07/2023   Procedure: A/V SHUNT INTERVENTION;  Surgeon: Magda Debby SAILOR, MD;  Location: [facility];  Service: Cardiovascular;  Laterality: Right;   APPLICATION  OF WOUND VAC N/A 10/03/2021   Procedure: APPLICATION OF WOUND VAC;  Surgeon: Obadiah Coy, MD;  Location: [facility];  Service: Thoracic;  Laterality: N/A;   AV FISTULA PLACEMENT Right 03/14/2022   Procedure: RIGHT ARM ARTERIOVENOUS (AV) FISTULA VERSUS ARTERIOVENOUS GRAFT CREATION;  Surgeon: Serene Gaile ORN, MD;  Location: [facility];  Service: Vascular;  Laterality: Right;   BASCILIC VEIN TRANSPOSITION Right 09/26/2022   Procedure: RIGHT ARM SECOND STAGE BASILIC VEIN TRANSPOSITION;  Surgeon: Serene Gaile ORN, MD;  Location: [facility];  Service: Vascular;  Laterality: Right;   CANNULATION FOR ECMO (EXTRACORPOREAL MEMBRANE OXYGENATION) N/A 09/29/2021   Procedure: CANNULATION FOR ECMO (EXTRACORPOREAL MEMBRANE OXYGENATION);  Surgeon: Shyrl Linnie KIDD, MD;  Location[facility];  Service: Open Heart Surgery;  Laterality: N/A;   CARDIAC CATHETERIZATION N/A 03/19/2015   Procedure: Left Heart Cath and Coronary Angiography;  Surgeon: Dorn JINNY Lesches, MD;  Location: [facility] INVASIVE CV LAB;  Service: Cardiovascular;  Laterality: N/A;   CARDIAC CATHETERIZATION N/A 03/20/2015   Procedure: Coronary Stent Intervention;  Surgeon: Dorn JINNY Lesches, MD;  [facility];  Service: Cardiovascular;  Laterality: N/A;   CARDIAC CATHETERIZATION N/A 03/22/2015   Procedure: Coronary Stent Intervention;  Surgeon: Dorn JINNY Lesches, MD;  [facility];  Service: Cardiovascular;   Laterality: N/A;   CARDIOVERSION N/A 08/17/2023   Procedure: CARDIOVERSION;  Surgeon: Mona Vinie BROCKS, MD;  [facility];  Service: Cardiovascular;  Laterality: N/A;   CORONARY ARTERY BYPASS GRAFT N/A 09/12/2021   Procedure: CORONARY ARTERY BYPASS GRAFTING (CABG) X 5 USING LEFT INTERNAL MAMMARY ARTERY AND ENDOSCOPICALLY HARVESTED RIGHT GREATER SAPHENOUS VEIN.;  Surgeon: Lucas Dorise POUR, MD;  [facility];  Service: Open Heart Surgery;  Laterality: N/A;   EXPLORATION POST OPERATIVE OPEN HEART N/A 09/30/2021   Procedure: EXPLORATION POST OPERATIVE OPEN HEART WASHOUT;  Surgeon: Shyrl Linnie KIDD, MD;  [facility];  Service: Open Heart Surgery;  Laterality: N/A;   IR FLUORO GUIDE CV LINE LEFT  11/04/2021   IR FLUORO GUIDE CV LINE LEFT  12/06/2021   IR FLUORO GUIDE CV LINE LEFT  04/11/2022   IR GASTROSTOMY TUBE MOD SED  11/15/2021   IR GASTROSTOMY TUBE MOD SED  11/20/2021   IR GASTROSTOMY TUBE REMOVAL  01/20/2022   IR US  GUIDE VASC ACCESS LEFT  11/04/2021   IR VENOCAVAGRAM SVC  12/06/2021   IR VENOCAVAGRAM SVC  04/11/2022   LEFT HEART CATH AND CORONARY ANGIOGRAPHY N/A 12/25/2020   Procedure: LEFT HEART CATH AND CORONARY ANGIOGRAPHY;  Surgeon: Burnard Debby LABOR, MD;  [facility];  Service: Cardiovascular;  Laterality: N/A;   LEFT HEART CATH AND CORONARY ANGIOGRAPHY N/A 12/26/2020   Procedure: LEFT HEART CATH AND CORONARY ANGIOGRAPHY;  Surgeon: Burnard Debby LABOR, MD;  [facility];  Service: Cardiovascular;  Laterality: N/A;   MEDIASTINAL EXPLORATION N/A 10/03/2021   Procedure: MEDIASTINAL WASHOUT;  Surgeon: Obadiah Coy, MD;  [facility];  Service: Thoracic;  Laterality: N/A;  PUMP STANDBY   MEDIASTINAL EXPLORATION N/A 10/12/2021   Procedure: MEDIASTINAL WASHOUT;  Surgeon: Lucas Dorise POUR, MD;  [facility];  Service: Thoracic;  Laterality: N/A;   PERCUTANEOUS TRACHEOSTOMY N/A 10/17/2021   Procedure: PERCUTANEOUS TRACHEOSTOMY USING SHILEY FLEXIBLE 8  mm CUFFED TRACH.;  Surgeon: Claudene Toribio BROCKS, MD;  Location: [facility] OR;  Service: Pulmonary;  Laterality: N/A;  Percutaneous tracheostomy   PERICARDIAL WINDOW Right 09/28/2021   Procedure: PERICARDIAL WINDOW;  Surgeon: Shyrl Linnie  O, MD;  [facility];  Service: Thoracic;  Laterality: Right;  Right VATS.  Lazy lateral.  double lumen ET tube   PLACEMENT OF IMPELLA LEFT VENTRICULAR ASSIST DEVICE N/A 10/07/2021   Procedure: INSERTION OF RIGHT AXILLARY IMPELLA, DECANNULATION OF ECMO, AND MEDIASTINAL WASHOUT;  Surgeon: Lucas Dorise POUR, MD;  [facility];  Service: Open Heart Surgery;  Laterality: N/A;  Open right axillary with 8 mm Hemashield Platinum Vascular graft.   RADIOLOGY WITH ANESTHESIA N/A 11/20/2021   Procedure: G-Tube Placement;  Surgeon: Adele Rush, MD;  Location: [facility] OR;  Service: Radiology;  Laterality: N/A;   REMOVAL OF IMPELLA LEFT VENTRICULAR ASSIST DEVICE Right 10/17/2021   Procedure: REMOVAL OF IMPELLA LEFT VENTRICULAR ASSIST DEVICE;  Surgeon: Lucas Dorise POUR, MD;  [facility];  Service: Open Heart Surgery;  Laterality: Right;   STERNAL CLOSURE N/A 10/12/2021   Procedure: STERNAL CLOSURE;  Surgeon: Lucas Dorise POUR, MD;  [facility];  Service: Thoracic;  Laterality: N/A;   TEE WITHOUT CARDIOVERSION N/A 09/12/2021   Procedure: TRANSESOPHAGEAL ECHOCARDIOGRAM (TEE);  Surgeon: Lucas Dorise POUR, MD;  Location: [facility] OR;  Service: Open Heart Surgery;  Laterality: N/A;   TEE WITHOUT CARDIOVERSION N/A 09/29/2021   Procedure: TRANSESOPHAGEAL ECHOCARDIOGRAM (TEE);  Surgeon: Shyrl Linnie KIDD, MD;  Location: [facility] OR;  Service: Open Heart Surgery;  Laterality: N/A;   TEE WITHOUT CARDIOVERSION N/A 10/03/2021   Procedure: TRANSESOPHAGEAL ECHOCARDIOGRAM (TEE);  Surgeon: Obadiah Coy, MD;  Location: [facility] OR;  Service: Thoracic;  Laterality: N/A;   TEE WITHOUT CARDIOVERSION  10/07/2021   Procedure: TRANSESOPHAGEAL ECHOCARDIOGRAM (TEE);  Surgeon: Lucas Dorise POUR, MD;  Location: [facility] OR;  Service: Open  Heart Surgery;;   TEE WITHOUT CARDIOVERSION N/A 10/12/2021   Procedure: TRANSESOPHAGEAL ECHOCARDIOGRAM (TEE);  Surgeon: Lucas Dorise POUR, MD;  Location: [facility] OR;  Service: Thoracic;  Laterality: N/A;   TEE WITHOUT CARDIOVERSION N/A 10/17/2021   Procedure: TRANSESOPHAGEAL ECHOCARDIOGRAM (TEE);  Surgeon: Lucas Dorise POUR, MD;  Location: [facility] OR;  Service: Open Heart Surgery;  Laterality: N/A;   TESTICLE SURGERY  1970   pt states testicle was ascended and had to be pulled down   TONSILLECTOMY     as a child   TRANSESOPHAGEAL ECHOCARDIOGRAM (CATH LAB) N/A 08/17/2023   Procedure: TRANSESOPHAGEAL ECHOCARDIOGRAM;  Surgeon: Mona Vinie BROCKS, MD;  Location: [facility] INVASIVE CV LAB;  Service: Cardiovascular;  Laterality: N/A;   VENOUS ANGIOPLASTY  09/07/2023   Procedure: VENOUS ANGIOPLASTY;  Surgeon: Magda Debby SAILOR, MD;  Location: [facility];  Service: Cardiovascular;;  Basilic/Intragraft     Social History:   reports that he quit smoking about 8 years ago. His smoking use included cigarettes. He has never used smokeless tobacco. He reports that he does not currently use alcohol . He reports that he does not currently use drugs.   Family History:  His family history is not on file.   Allergies No Known Allergies   Home Medications  Prior to Admission medications   Medication Sig Start Date End Date Taking? Authorizing Provider  amiodarone  (PACERONE ) 200 MG tablet Take 1 tablet (200 mg total) by mouth daily. 10/15/22  Yes Weaver, Scott T, PA-C  apixaban  (ELIQUIS ) 5 MG TABS tablet Take 1 tablet (5 mg total) by mouth 2 (two) times daily. 10/15/22  Yes Weaver, Scott T, PA-C  atorvastatin  (LIPITOR ) 80 MG tablet Take 1 tablet (80 mg total) by mouth daily. 10/15/22  Yes Weaver, Scott T, PA-C  sevelamer  carbonate (RENVELA ) 800 MG tablet Take 1,600 mg  by mouth.   Yes [provider]  Cinacalcet HCl (SENSIPAR PO) Take 60 mg by mouth. 07/28/23 07/26/24  [provider]  cycloSPORINE  (RESTASIS ) 0.05 %  ophthalmic emulsion Place 1 drop into both eyes 2 (two) times daily. 02/27/23     LOPERAMIDE  HCL PO Take 2 mg by mouth. 05/14/23 05/12/24  [provider]  loteprednol  (LOTEMAX ) 0.5 % ophthalmic suspension Place 1 drop into both eyes daily. 02/27/23     Methoxy PEG-Epoetin Beta (MIRCERA IJ) Mircera 04/16/23 04/14/24  [provider]  Methoxy PEG-Epoetin Beta (MIRCERA IJ) 120 mcg. 01/22/23 07/14/24  [provider]  Methoxy PEG-Epoetin Beta (MIRCERA IJ) Mircera 03/19/23 04/28/24  [provider]  Methoxy PEG-Epoetin Beta (MIRCERA IJ) 75 mcg. 02/19/23 05/26/24  [provider]  multivitamin (RENA-VIT) TABS tablet Take 1 tablet by mouth. 12/03/21   [provider]     Critical care time:       CRITICAL CARE Performed by: Ronnald FORBES Gave   Total critical care time: 36 minutes  Critical care time was exclusive of separately billable procedures and treating other patients. Critical care was necessary to treat or prevent imminent or life-threatening deterioration.  Critical care was time spent personally by me on the following activities: development of treatment plan with patient and/or surrogate as well as nursing, discussions with consultants, evaluation of patient's response to treatment, examination of patient, obtaining history from patient or surrogate, ordering and performing treatments and interventions, ordering and review of laboratory studies, ordering and review of radiographic studies, pulse oximetry and re-evaluation of patient's condition.  Ronnald Gave MSN, AGACNP-BC [facility] Amion for pager 10/01/2023, 1:25 PM

## 2023-10-01 NOTE — Progress Notes (Signed)
 This RN to bedside. Pt in trendelenburg position d/t low BP s/p Ablation, and turned to his right side. Pt verbalizing he cannot breathe. SpO2 high 90s on 2L [place]. Voice raspy, with sharp whistle sound heard upon patient inhalation. Pt repositioned into reverse trendelenburg, with no change in patient's respiratory status. Diminished to faint inspiratory wheezing heard throughout lung fields with stridor heard in airway. Dr. Epifanio called to make aware. Verbal orders given for duoneb treatment. Dr. Epifanio to bedside. Duoneb given with patient verbalizing slight improvement of symptoms. Dr. Nancey updated by Dr. Epifanio. Dr. Nancey to bedside. Plans to upgrade patient to ICU. CCM consulted.  Pt's symptoms returned with increased stridor heard without stethoscope auscultation. Dr. Epifanio called and to bedside. Orders given for racemic epi. CCM called to come to bedside urgently. Racemic epi give. Report given to [facility] RN. Pt transported to ICU.

## 2023-10-01 NOTE — Progress Notes (Signed)
 Pt noted to be markedly volume overloaded and in resp distress s/p ablation.  Outpatient HD had been limited and inadequate due to tachycardia and hypotension.  Pt given duoneb but has continued sensation of SOB and respiratory stridor.  Discussed with CCM along with Dr Nancey. CCM will see and admit pt for dialysis; potentially requiring pressor support.  Ozell [address] [place], PA-C

## 2023-10-01 NOTE — Interval H&P Note (Signed)
 History and Physical Interval Note:  10/01/2023 8:35 AM  [name]  has presented today for surgery, with the diagnosis of A-flutter.  The various methods of treatment have been discussed with the patient and family. After consideration of risks, benefits and other options for treatment, the patient has consented to  Procedure(s): A-FLUTTER ABLATION (N/A) as a surgical intervention.  The patient's history has been reviewed, patient examined, no change in status, stable for surgery.  I have reviewed the patient's chart and labs.  Questions were answered to the patient's satisfaction.     Ghalia Reicks E Supreme Rybarczyk

## 2023-10-01 NOTE — Anesthesia Preprocedure Evaluation (Signed)
 Anesthesia Evaluation  Patient identified by MRN, date of birth, ID band Patient awake    Reviewed: Allergy & Precautions, NPO status , Patient's Chart, lab work & pertinent test results  Airway Mallampati: III  TM Distance: >3 FB Neck ROM: Full    Dental  (+) Dental Advisory Given   Pulmonary shortness of breath and with exertion, former smoker   breath sounds clear to auscultation       Cardiovascular hypertension, Pt. on medications and Pt. on home beta blockers + CAD, + Past MI and + CABG   Rhythm:Regular Rate:Normal     Neuro/Psych CVA    GI/Hepatic Neg liver ROS,GERD  ,,  Endo/Other  diabetes, Type 2    Renal/GU ESRFRenal disease     Musculoskeletal   Abdominal   Peds  Hematology  (+) Blood dyscrasia, anemia   Anesthesia Other Findings   Reproductive/Obstetrics                              Anesthesia Physical Anesthesia Plan  ASA: 4  Anesthesia Plan: General   Post-op Pain Management: Minimal or no pain anticipated   Induction: Intravenous  PONV Risk Score and Plan: 2 and Dexamethasone , Ondansetron  and Treatment may vary due to age or medical condition  Airway Management Planned: Oral ETT  Additional Equipment:   Intra-op Plan:   Post-operative Plan: Extubation in OR  Informed Consent: I have reviewed the patients History and Physical, chart, labs and discussed the procedure including the risks, benefits and alternatives for the proposed anesthesia with the patient or authorized representative who has indicated his/her understanding and acceptance.     Dental advisory given  Plan Discussed with: CRNA  Anesthesia Plan Comments:         Anesthesia Quick Evaluation

## 2023-10-01 NOTE — Interval H&P Note (Signed)
 History and Physical Interval Note:  10/01/2023 8:41 AM  [name]  has presented today for surgery, with the diagnosis of A-flutter.  The various methods of treatment have been discussed with the patient and family. After consideration of risks, benefits and other options for treatment, the patient has consented to  Procedure(s): A-FLUTTER ABLATION (N/A) as a surgical intervention.  The patient's history has been reviewed, patient examined, no change in status, stable for surgery.  I have reviewed the patient's chart and labs.  Questions were answered to the patient's satisfaction.     Miche Loughridge E Freya Zobrist

## 2023-10-01 NOTE — Anesthesia Postprocedure Evaluation (Signed)
 Anesthesia Post Note  Patient: [name]  Procedure(s) Performed: A-FLUTTER ABLATION     Patient location during evaluation: PACU Anesthesia Type: General Level of consciousness: awake and alert Pain management: pain level controlled Vital Signs Assessment: post-procedure vital signs reviewed and stable Respiratory status: spontaneous breathing and patient connected to nasal cannula oxygen  (Pt with subjective difficulty breathing and mild stridor. Pt with +ETCO2 on capnograph and able to talk. Discussed with CCM and suspect pt with volume overload and potential subglottic stenosis given need to change from 8.0 ETT to 7.0ETT during intubation.) Cardiovascular status: blood pressure returned to baseline and stable Postop Assessment: no apparent nausea or vomiting Anesthetic complications: no Comments: Plan for 2H admit with CCM managing. Plan BiPAP, racemic epi, and dialysis.   There were no known notable events for this encounter.  Last Vitals:  Vitals:   10/01/23 1245 10/01/23 1316  BP: 102/76   Pulse: 86   Resp: (!) 22 (!) 21  Temp:    SpO2: 99%     Last Pain:  Vitals:   10/01/23 1031  TempSrc: Oral   Pain Goal:                   [name]

## 2023-10-01 NOTE — Progress Notes (Signed)
 Arrived in patient's room,alert and responsive by nodding,SOB at rest,he I on bi-pap. Consent verified.  Access used : Right arm avf that worked well.   Duration of treatment: 3.25 hours.  Uf goal : Met 3 liters,tolerated treatment without giving any blood support ordered.  Hand  off to the patient's nurse at bedside,with stable condition.

## 2023-10-01 NOTE — Consult Note (Signed)
 Renal Service Consult Note [facility] [name], MD  Patient: [name] Date: 10/01/2023 Requesting Physician: Dr. Gretta  Reason for Consult: ESRD patient with respiratory failure following atrial flutter ablation procedure HPI: The patient is a 63 y.o. year-old w/ PMH as below who presented for an elective ablation procedure for atrial flutter.  Patient underwent the procedure this morning per cardiology.  He has CAD with preserved EF and status post CABG with very complicated postop course including ECMO earlier this year.  The patient's atrial flutter was highly symptomatic with shortness of breath, he failed amiodarone  and thus needed ablation.  Fluid has been difficult to remove at outpatient dialysis due to high heart rates and low blood pressures.  He was intubated for the case then subsequently extubated.  In recovery room started to have shortness of breath and was given DuoNeb treatment and PCCM was consulted.  We are asked to see for dialysis.   Pt seen in ICU bed.  Face patient has had BiPAP mask on.  Chest x-ray was just done which shows vascular congestion and possibly interstitial stational edema.  The patient does not give much history but he is alert and answers questions by nodding or shaking his head.   ROS - denies CP, no joint pain, no HA, no blurry vision, no rash, no diarrhea, no nausea/ vomiting   Past Medical History  Past Medical History:  Diagnosis Date   AKI (acute kidney injury) (HCC)    pt unaware of this   Anginal pain (HCC)    CAD (coronary artery disease)    a. 03/2015 NSTEMI: LHC with severe 3V CAD  (70% mid RCA, 95% OM1, 90% distal LCx, 90% OM3, 80% prox LAD and 90% ost D1) s/p DES to mLAD w/ small dissction Rx with DES, staged ost Ramus PCI/DES and dLCx s/p PCI/DES    Chest pain 12/24/2020   Diabetes mellitus type 2 in obese    Diverticulosis    Dyspnea    Dyspnea on exertion 03/16/2015   Dyspnea on exertion   Family history  of adverse reaction to anesthesia    patient father- pt states after anesthesia his father developed dementia   GERD (gastroesophageal reflux disease)    Hypercholesteremia    Hypertension associated with diabetes (HCC) 03/16/2015   hypertension   NSTEMI (non-ST elevated myocardial infarction) (HCC) 03/17/2015   Obesity    Stroke (HCC) 2022   pt states he had a mini stroke during cardiac catheterization   Tobacco abuse    TTHS- [address]    Past Surgical History  Past Surgical History:  Procedure Laterality Date   A/V SHUNT INTERVENTION Right 09/07/2023   Procedure: A/V SHUNT INTERVENTION;  Surgeon: Magda Debby SAILOR, MD;  Location: [facility];  Service: Cardiovascular;  Laterality: Right;   APPLICATION OF WOUND VAC N/A 10/03/2021   Procedure: APPLICATION OF WOUND VAC;  Surgeon: Obadiah Coy, MD;  Location: [facility];  Service: Thoracic;  Laterality: N/A;   AV FISTULA PLACEMENT Right 03/14/2022   Procedure: RIGHT ARM ARTERIOVENOUS (AV) FISTULA VERSUS ARTERIOVENOUS GRAFT CREATION;  Surgeon: Serene Gaile ORN, MD;  Location: [facility];  Service: Vascular;  Laterality: Right;   BASCILIC VEIN TRANSPOSITION Right 09/26/2022   Procedure: RIGHT ARM SECOND STAGE BASILIC VEIN TRANSPOSITION;  Surgeon: Serene Gaile ORN, MD;  Location: [facility];  Service: Vascular;  Laterality: Right;   CANNULATION FOR ECMO (EXTRACORPOREAL MEMBRANE OXYGENATION) N/A 09/29/2021   Procedure: CANNULATION FOR ECMO (EXTRACORPOREAL MEMBRANE OXYGENATION);  Surgeon: Shyrl Linnie KIDD, MD;  Location: [facility] OR;  Service: Open Heart Surgery;  Laterality: N/A;   CARDIAC CATHETERIZATION N/A 03/19/2015   Procedure: Left Heart Cath and Coronary Angiography;  Surgeon: Dorn JINNY Lesches, MD;  Location: [facility] INVASIVE CV LAB;  Service: Cardiovascular;  Laterality: N/A;   CARDIAC CATHETERIZATION N/A 03/20/2015   Procedure: Coronary Stent Intervention;  Surgeon: Dorn JINNY Lesches, MD;  [facility];  Service: Cardiovascular;   Laterality: N/A;   CARDIAC CATHETERIZATION N/A 03/22/2015   Procedure: Coronary Stent Intervention;  Surgeon: Dorn JINNY Lesches, MD;  [facility];  Service: Cardiovascular;  Laterality: N/A;   CARDIOVERSION N/A 08/17/2023   Procedure: CARDIOVERSION;  Surgeon: Mona Vinie BROCKS, MD;  [facility];  Service: Cardiovascular;  Laterality: N/A;   CORONARY ARTERY BYPASS GRAFT N/A 09/12/2021   Procedure: CORONARY ARTERY BYPASS GRAFTING (CABG) X 5 USING LEFT INTERNAL MAMMARY ARTERY AND ENDOSCOPICALLY HARVESTED RIGHT GREATER SAPHENOUS VEIN.;  Surgeon: Lucas Dorise POUR, MD;  [facility];  Service: Open Heart Surgery;  Laterality: N/A;   EXPLORATION POST OPERATIVE OPEN HEART N/A 09/30/2021   Procedure: EXPLORATION POST OPERATIVE OPEN HEART WASHOUT;  Surgeon: Shyrl Linnie KIDD, MD;  [facility];  Service: Open Heart Surgery;  Laterality: N/A;   IR FLUORO GUIDE CV LINE LEFT  11/04/2021   IR FLUORO GUIDE CV LINE LEFT  12/06/2021   IR FLUORO GUIDE CV LINE LEFT  04/11/2022   IR GASTROSTOMY TUBE MOD SED  11/15/2021   IR GASTROSTOMY TUBE MOD SED  11/20/2021   IR GASTROSTOMY TUBE REMOVAL  01/20/2022   IR US  GUIDE VASC ACCESS LEFT  11/04/2021   IR VENOCAVAGRAM SVC  12/06/2021   IR VENOCAVAGRAM SVC  04/11/2022   LEFT HEART CATH AND CORONARY ANGIOGRAPHY N/A 12/25/2020   Procedure: LEFT HEART CATH AND CORONARY ANGIOGRAPHY;  Surgeon: Burnard Debby LABOR, MD;  [facility];  Service: Cardiovascular;  Laterality: N/A;   LEFT HEART CATH AND CORONARY ANGIOGRAPHY N/A 12/26/2020   Procedure: LEFT HEART CATH AND CORONARY ANGIOGRAPHY;  Surgeon: Burnard Debby LABOR, MD;  [facility];  Service: Cardiovascular;  Laterality: N/A;   MEDIASTINAL EXPLORATION N/A 10/03/2021   Procedure: MEDIASTINAL WASHOUT;  Surgeon: Obadiah Coy, MD;  [facility];  Service: Thoracic;  Laterality: N/A;  PUMP STANDBY   MEDIASTINAL EXPLORATION N/A 10/12/2021   Procedure: MEDIASTINAL WASHOUT;   Surgeon: Lucas Dorise POUR, MD;  [facility];  Service: Thoracic;  Laterality: N/A;   PERCUTANEOUS TRACHEOSTOMY N/A 10/17/2021   Procedure: PERCUTANEOUS TRACHEOSTOMY USING SHILEY FLEXIBLE 8 mm CUFFED TRACH.;  Surgeon: Claudene Toribio BROCKS, MD;  Location: [facility] OR;  Service: Pulmonary;  Laterality: N/A;  Percutaneous tracheostomy   PERICARDIAL WINDOW Right 09/28/2021   Procedure: PERICARDIAL WINDOW;  Surgeon: Shyrl Linnie KIDD, MD;  [facility];  Service: Thoracic;  Laterality: Right;  Right VATS.  Lazy lateral.  double lumen ET tube   PLACEMENT OF IMPELLA LEFT VENTRICULAR ASSIST DEVICE N/A 10/07/2021   Procedure: INSERTION OF RIGHT AXILLARY IMPELLA, DECANNULATION OF ECMO, AND MEDIASTINAL WASHOUT;  Surgeon: Lucas Dorise POUR, MD;  [facility];  Service: Open Heart Surgery;  Laterality: N/A;  Open right axillary with 8 mm Hemashield Platinum Vascular graft.   RADIOLOGY WITH ANESTHESIA N/A 11/20/2021   Procedure: G-Tube Placement;  Surgeon: Adele Rush, MD;  Location: [facility] OR;  Service: Radiology;  Laterality: N/A;   REMOVAL OF IMPELLA LEFT VENTRICULAR ASSIST DEVICE Right 10/17/2021  Procedure: REMOVAL OF IMPELLA LEFT VENTRICULAR ASSIST DEVICE;  Surgeon: Lucas Dorise POUR, MD;  [facility];  Service: Open Heart Surgery;  Laterality: Right;   STERNAL CLOSURE N/A 10/12/2021   Procedure: STERNAL CLOSURE;  Surgeon: Lucas Dorise POUR, MD;  [facility];  Service: Thoracic;  Laterality: N/A;   TEE WITHOUT CARDIOVERSION N/A 09/12/2021   Procedure: TRANSESOPHAGEAL ECHOCARDIOGRAM (TEE);  Surgeon: Lucas Dorise POUR, MD;  Location: [facility] OR;  Service: Open Heart Surgery;  Laterality: N/A;   TEE WITHOUT CARDIOVERSION N/A 09/29/2021   Procedure: TRANSESOPHAGEAL ECHOCARDIOGRAM (TEE);  Surgeon: Shyrl Linnie KIDD, MD;  Location: [facility] OR;  Service: Open Heart Surgery;  Laterality: N/A;   TEE WITHOUT CARDIOVERSION N/A 10/03/2021   Procedure: TRANSESOPHAGEAL ECHOCARDIOGRAM (TEE);  Surgeon: Obadiah Coy, MD;  Location: [facility] OR;   Service: Thoracic;  Laterality: N/A;   TEE WITHOUT CARDIOVERSION  10/07/2021   Procedure: TRANSESOPHAGEAL ECHOCARDIOGRAM (TEE);  Surgeon: Lucas Dorise POUR, MD;  Location: [facility] OR;  Service: Open Heart Surgery;;   TEE WITHOUT CARDIOVERSION N/A 10/12/2021   Procedure: TRANSESOPHAGEAL ECHOCARDIOGRAM (TEE);  Surgeon: Lucas Dorise POUR, MD;  Location: [facility] OR;  Service: Thoracic;  Laterality: N/A;   TEE WITHOUT CARDIOVERSION N/A 10/17/2021   Procedure: TRANSESOPHAGEAL ECHOCARDIOGRAM (TEE);  Surgeon: Lucas Dorise POUR, MD;  Location: [facility] OR;  Service: Open Heart Surgery;  Laterality: N/A;   TESTICLE SURGERY  1970   pt states testicle was ascended and had to be pulled down   TONSILLECTOMY     as a child   TRANSESOPHAGEAL ECHOCARDIOGRAM (CATH LAB) N/A 08/17/2023   Procedure: TRANSESOPHAGEAL ECHOCARDIOGRAM;  Surgeon: Mona Vinie BROCKS, MD;  Location: [facility] INVASIVE CV LAB;  Service: Cardiovascular;  Laterality: N/A;   VENOUS ANGIOPLASTY  09/07/2023   Procedure: VENOUS ANGIOPLASTY;  Surgeon: Magda Debby SAILOR, MD;  Location: [facility];  Service: Cardiovascular;;  Basilic/Intragraft   Family History No family history on file. Social History  reports that he quit smoking about 8 years ago. His smoking use included cigarettes. He has never used smokeless tobacco. He reports that he does not currently use alcohol . He reports that he does not currently use drugs. Allergies No Known Allergies Home medications Prior to Admission medications   Medication Sig Start Date End Date Taking? Authorizing Provider  amiodarone  (PACERONE ) 200 MG tablet Take 1 tablet (200 mg total) by mouth daily. 10/15/22  Yes Weaver, Scott T, PA-C  apixaban  (ELIQUIS ) 5 MG TABS tablet Take 1 tablet (5 mg total) by mouth 2 (two) times daily. 10/15/22  Yes Weaver, Scott T, PA-C  atorvastatin  (LIPITOR ) 80 MG tablet Take 1 tablet (80 mg total) by mouth daily. 10/15/22  Yes Weaver, Scott T, PA-C  sevelamer  carbonate (RENVELA ) 800 MG tablet Take 1,600 mg by mouth.    Yes [provider]  Cinacalcet HCl (SENSIPAR PO) Take 60 mg by mouth. 07/28/23 07/26/24  [provider]  cycloSPORINE  (RESTASIS ) 0.05 % ophthalmic emulsion Place 1 drop into both eyes 2 (two) times daily. 02/27/23     LOPERAMIDE  HCL PO Take 2 mg by mouth. 05/14/23 05/12/24  [provider]  loteprednol  (LOTEMAX ) 0.5 % ophthalmic suspension Place 1 drop into both eyes daily. 02/27/23     Methoxy PEG-Epoetin Beta (MIRCERA IJ) Mircera 04/16/23 04/14/24  [provider]  multivitamin (RENA-VIT) TABS tablet Take 1 tablet by mouth. 12/03/21   [provider]     Vitals:   10/01/23 1215 10/01/23 1230 10/01/23 1245 10/01/23 1316  BP: 103/63 97/64 102/76  Pulse: 77 85 86   Resp: (!) 25 19 (!) 22 (!) 21  Temp:      TempSrc:      SpO2: 99% 98% 99%   Weight:      Height:       Exam Gen alert, no distress, 92-102/ 61-76 No rash, cyanosis or gangrene Sclera anicteric, throat clear  No jvd or bruits Chest clear bilat to bases, no rales/ wheezing RRR no MRG Abd soft ntnd no mass or ascites +bs GU deferred MS no joint effusions or deformity Ext trace LE or UE edema, no other edema Neuro is alert, Ox 3 , nf    Right arm aVF+bruit   Home bp meds: none   OP HD: TTS [facility] 4h  B400   98.5kg   2K bath  AVF  Heparin  none Last OP HD 8/20, post wt 101.6 Comes off 2-5 kg over last 2 wks  CXR bilat vasc congestion +/- IS edema  Assessment/ Plan: S/P Aflutter ablation: per cardiology  Resp distress: in recovery, on bipap. CXR w/ vasc congestion and early IS edema. Haven't been getting all his fluid off at OP units due to low bp's and high HR's. Plan HD in ICU.  ESRD: on HD TTS. Last HD yesterday. HD today urgently in ICU BP: not on any bp lowering meds, or midodrine , at home.  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    Myer Fret  MD [facility] 10/01/2023, 1:23 PM  Recent Labs  Lab 09/25/23 1629 10/01/23 0842  HGB  10.6* 12.2*  CALCIUM  7.9*  --   CREATININE 7.63* 5.50*  K 3.8 3.4*   Inpatient medications:  amiodarone   200 mg Oral Daily   apixaban   5 mg Oral BID   [START ON 10/02/2023] atorvastatin   80 mg Oral Daily   cycloSPORINE   1 drop Both Eyes BID   loteprednol   1 drop Both Eyes Daily   sodium chloride  flush  3 mL Intravenous Q12H    sodium chloride      dexmedetomidine      sodium chloride , acetaminophen  **OR** acetaminophen , calcium  carbonate (dosed in mg elemental calcium ), camphor-menthol  **AND** hydrOXYzine , dexmedetomidine , docusate sodium , feeding supplement (NEPRO CARB STEADY), ondansetron  (ZOFRAN ) IV, sodium chloride  flush

## 2023-10-02 ENCOUNTER — Encounter (HOSPITAL_COMMUNITY): Payer: Self-pay | Admitting: Cardiovascular Disease

## 2023-10-02 ENCOUNTER — Other Ambulatory Visit (HOSPITAL_COMMUNITY): Payer: Self-pay

## 2023-10-02 DIAGNOSIS — I132 Hypertensive heart and chronic kidney disease with heart failure and with stage 5 chronic kidney disease, or end stage renal disease: Secondary | ICD-10-CM | POA: Diagnosis not present

## 2023-10-02 DIAGNOSIS — R061 Stridor: Secondary | ICD-10-CM | POA: Diagnosis not present

## 2023-10-02 DIAGNOSIS — N186 End stage renal disease: Secondary | ICD-10-CM | POA: Diagnosis not present

## 2023-10-02 DIAGNOSIS — I2510 Atherosclerotic heart disease of native coronary artery without angina pectoris: Secondary | ICD-10-CM | POA: Diagnosis not present

## 2023-10-02 DIAGNOSIS — E8779 Other fluid overload: Secondary | ICD-10-CM | POA: Diagnosis not present

## 2023-10-02 DIAGNOSIS — I5033 Acute on chronic diastolic (congestive) heart failure: Secondary | ICD-10-CM | POA: Diagnosis not present

## 2023-10-02 DIAGNOSIS — Z87891 Personal history of nicotine dependence: Secondary | ICD-10-CM | POA: Diagnosis not present

## 2023-10-02 DIAGNOSIS — I4892 Unspecified atrial flutter: Secondary | ICD-10-CM | POA: Diagnosis not present

## 2023-10-02 DIAGNOSIS — Z992 Dependence on renal dialysis: Secondary | ICD-10-CM | POA: Diagnosis not present

## 2023-10-02 DIAGNOSIS — E8770 Fluid overload, unspecified: Secondary | ICD-10-CM | POA: Diagnosis not present

## 2023-10-02 DIAGNOSIS — E1122 Type 2 diabetes mellitus with diabetic chronic kidney disease: Secondary | ICD-10-CM | POA: Diagnosis not present

## 2023-10-02 DIAGNOSIS — I5032 Chronic diastolic (congestive) heart failure: Secondary | ICD-10-CM | POA: Diagnosis not present

## 2023-10-02 DIAGNOSIS — Z79899 Other long term (current) drug therapy: Secondary | ICD-10-CM | POA: Diagnosis not present

## 2023-10-02 DIAGNOSIS — I120 Hypertensive chronic kidney disease with stage 5 chronic kidney disease or end stage renal disease: Secondary | ICD-10-CM | POA: Diagnosis not present

## 2023-10-02 LAB — HEPATITIS B SURFACE ANTIBODY, QUANTITATIVE: Hep B S AB Quant (Post): 295 m[IU]/mL

## 2023-10-02 MED ORDER — ALBUTEROL SULFATE HFA 108 (90 BASE) MCG/ACT IN AERS
1.0000 | INHALATION_SPRAY | RESPIRATORY_TRACT | 0 refills | Status: DC | PRN
  Filled 2023-10-02: qty 8.5, fill #0

## 2023-10-02 MED ORDER — CYCLOSPORINE 0.05 % OP EMUL
1.0000 [drp] | Freq: Two times a day (BID) | OPHTHALMIC | 6 refills | Status: DC
  Filled 2023-10-02: qty 60, 30d supply, fill #0

## 2023-10-02 MED ORDER — LEVALBUTEROL TARTRATE 45 MCG/ACT IN AERO: 6.0000 | INHALATION_SPRAY | RESPIRATORY_TRACT | Status: DC | PRN

## 2023-10-02 MED ORDER — ALBUTEROL SULFATE HFA 108 (90 BASE) MCG/ACT IN AERS
1.0000 | INHALATION_SPRAY | Freq: Four times a day (QID) | RESPIRATORY_TRACT | Status: DC
  Administered 2023-10-02: 1 via RESPIRATORY_TRACT

## 2023-10-02 MED ORDER — ALBUTEROL SULFATE HFA 108 (90 BASE) MCG/ACT IN AERS
1.0000 | INHALATION_SPRAY | RESPIRATORY_TRACT | 0 refills | Status: AC | PRN
  Filled 2023-10-02: qty 6.7, 33d supply, fill #0

## 2023-10-02 MED ORDER — ALBUTEROL SULFATE HFA 108 (90 BASE) MCG/ACT IN AERS: 8.0000 | INHALATION_SPRAY | Freq: Four times a day (QID) | RESPIRATORY_TRACT | Status: DC

## 2023-10-02 NOTE — Discharge Instructions (Signed)

## 2023-10-02 NOTE — Progress Notes (Signed)
 Rounding Note   Patient Name: [name] Date of Encounter: 10/02/2023  [facility] Cardiologist: Arun K Thukkani, MD   Subjective Feels so much better! No CP, no SOB, no groin pain  Scheduled Meds:  amiodarone   200 mg Oral Daily   apixaban   5 mg Oral BID   arformoterol   15 mcg Nebulization BID   atorvastatin   80 mg Oral Daily   Chlorhexidine  Gluconate Cloth  6 each Topical Q0600   cycloSPORINE   1 drop Both Eyes BID   ipratropium-albuterol   3 mL Nebulization Q4H   midodrine   10 mg Oral Once in dialysis   revefenacin   175 mcg Nebulization Daily   sodium chloride  flush  3 mL Intravenous Q12H   Continuous Infusions:  sodium chloride      sodium chloride      albumin  human     anticoagulant sodium citrate      norepinephrine  (LEVOPHED ) Adult infusion     PRN Meds: sodium chloride , acetaminophen  **OR** acetaminophen , albumin  human, alteplase , anticoagulant sodium citrate , calcium  carbonate (dosed in mg elemental calcium ), camphor-menthol  **AND** hydrOXYzine , docusate sodium , feeding supplement (NEPRO CARB STEADY), heparin , ipratropium-albuterol , lidocaine  (PF), lidocaine -prilocaine , ondansetron  (ZOFRAN ) IV, pentafluoroprop-tetrafluoroeth, Racepinephrine HCl, sodium chloride  flush, sorbitol    Vital Signs  Vitals:   10/02/23 0500 10/02/23 0546 10/02/23 0600 10/02/23 0700  BP: 110/73  133/83 138/78  Pulse: 87  88 90  Resp: (!) 29  (!) 25 20  Temp:      TempSrc:      SpO2: 94% 100% 99% 94%  Weight: 99.4 kg     Height:        Intake/Output Summary (Last 24 hours) at 10/02/2023 0741 Last data filed at 10/02/2023 0600 Gross per 24 hour  Intake 970 ml  Output 3000 ml  Net -2030 ml      10/02/2023    5:00 AM 10/01/2023    2:15 PM 10/01/2023    8:07 AM  Last 3 Weights  Weight (lbs) 219 lb 2.2 oz 220 lb 220 lb  Weight (kg) 99.4 kg 99.79 kg 99.791 kg      Telemetry SR 80's-90's - Personally Reviewed  ECG  SR 86bpm, no acute/ischemic changes, personally  measured QT , QTc  - Personally Reviewed  Physical Exam  GEN: No acute distress.   Neck: No JVD Cardiac: RRR, no murmurs, rubs, or gallops.  Respiratory: diminished at the bases. GI: Soft, nontender, non-distended  MS: 1+ edema; No deformity. Neuro:  Nonfocal  Psych: Normal affect   Labs High Sensitivity Troponin:  No results for input(s): TROPONINIHS in the last 720 hours.   Chemistry Recent Labs  Lab 09/25/23 1629 10/01/23 0842 10/01/23 1320  NA 139 138 143  K 3.8 3.4* 3.9  CL 93* 95* 98  CO2 26  --  27  GLUCOSE 100* 117* 130*  BUN 35* 23 24*  CREATININE 7.63* 5.50* 6.13*  CALCIUM  7.9*  --  8.5*  GFRNONAA  --   --  10*  ANIONGAP  --   --  18*    Lipids No results for input(s): CHOL, TRIG, HDL, LABVLDL, LDLCALC, CHOLHDL in the last 168 hours.  Hematology Recent Labs  Lab 09/25/23 1629 10/01/23 0842 10/01/23 1320  WBC 9.4  --  8.4  RBC 3.53*  --  3.79*  HGB 10.6* 12.2* 11.3*  HCT 32.9* 36.0* 35.3*  MCV 93  --  93.1  MCH 30.0  --  29.8  MCHC 32.2  --  32.0  RDW 16.0*  --  17.9*  PLT 184  --  181   Thyroid  No results for input(s): TSH, FREET4 in the last 168 hours.  BNP Recent Labs  Lab 10/01/23 1400  BNP 715.1*    DDimer No results for input(s): DDIMER in the last 168 hours.   Radiology  DG CHEST PORT 1 VIEW Result Date: 10/01/2023 CLINICAL DATA:  Acute hypoxic respiratory failure. EXAM: PORTABLE CHEST 1 VIEW COMPARISON:  10/11/2022 FINDINGS: Prior median sternotomy. Cardiomegaly is stable. Bilateral pleural effusions and ill-defined basilar opacities. Vascular congestion. No pneumothorax. IMPRESSION: 1. Cardiomegaly with vascular congestion. 2. Bilateral pleural effusions and ill-defined basilar opacities, likely atelectasis. Electronically Signed   By: Andrea Gasman M.D.   On: 10/01/2023 15:38    Cardiac Studies  10/01/23: EPS/ablation  CONCLUSIONS:   1. Isthmus-dependent counter clockwise right atrial flutter.   2.  Successful radiofrequency ablation of atrial flutter along the cavotricuspid isthmus with complete bidirectional isthmus block achieved.   3. No inducible arrhythmias following ablation.   4. No early apparent complications.   08/17/23: TEE 1. Left ventricular ejection fraction, by estimation, is 60 to 65%. The  left ventricle has normal function. There is mild left ventricular  hypertrophy.   2. Right ventricular systolic function is mildly reduced. The right  ventricular size is normal.   3. Left atrial size was mildly dilated. No left atrial/left atrial  appendage thrombus was detected.   4. The mitral valve is abnormal. Mild mitral valve regurgitation.   5. The aortic valve is tricuspid. Aortic valve regurgitation is not  visualized. Aortic valve sclerosis is present, with no evidence of aortic  valve stenosis.   Conclusion(s)/Recommendation(s): No LA/LAA thrombus identified. Successful  cardioversion performed with restoration of normal sinus rhythm.   Patient Profile   63 y.o. male w/PMHx of ESRF on HD CAD (CABG 2023, hx of STEMI 2022) Chronic CHF (HFpEF) AFlutter (typical  Assessment & Plan    AFlutter S/p CTI ablation Maintaining SR  Stop amiodarone  Continue Eliquis   R groin site is stable Activity restrictions, post procedure education done EP follow up is I place  Acute CHF exacerbation Recently has had abbreviated HD sessions 2/2 tachycardia and hypotension S/p HD yesterday and resp tx Feels much better today  3. CAD No CP  OK to discharge from an EP perspective when felt ready medically otherwise   For questions or updates, please contact [facility] Please consult [email or web address] for contact info under     Signed, Charlies Macario Arthur, PA-C  10/02/2023, 7:41 AM

## 2023-10-02 NOTE — Care Management Obs Status (Signed)
 MEDICARE OBSERVATION STATUS NOTIFICATION   Patient Details  Name: EMERSYN KOTARSKI MRN: [id number] Date of Birth: 1960-12-12   Medicare Observation Status Notification Given:  Yes    Justina Delcia Czar, RN 10/02/2023, 11:19 AM

## 2023-10-02 NOTE — Progress Notes (Signed)
 Noted d/c orders. Contacted op hd clinic, [facility], to inform of pt d/c and to anticipate pt back at clinic tomorrow.   Lavanda Jannell Franta Dialysis Navigator [phone]

## 2023-10-02 NOTE — Discharge Summary (Addendum)
 Physician Discharge Summary  Patient ID: [name] MRN: [id number] DOB/AGE: [birth year] 63 y.o.  Admit date: 10/01/2023 Discharge date: 10/02/2023  Problem List Principal Problem:   CHF (congestive heart failure) (HCC) Active Problems:   Atrial flutter (HCC)   Acute on chronic diastolic (congestive) heart failure (HCC)   Stridor  HPI: 63 yo M PMH CAD s/p CABG c/b Dresslers (with cardiac tamponade and associated cardiac arrest) requiring [facility] ECMO, ESRD on HD, who presented to [facility] 10/01/23 for elective aflutter ablation. He was intubated for the case, 8.0 wouldn't pass so ultimately downsized to 7.0, and rcvd decadron . Ablation unremarkable, subsequently extubated.  In recovery, started to have SOB and feeling like he can't take a breath in. He was given duoneb tx, and pccm is consulted in this setting    In chart review looks like his wts have been uptrending and there have been outpt concerns re soft blood pressures limiting HD   ____________________________________________  Hospital Course:  10/01/23 outpatient ablation for Aflutter. Intubated for procedure, unable to pass 8.0 through VC so downsized to 7.0.  Stridor and SOB after extubation. Admit to PCCM. 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 Received HD. Transient BiPAP support as well as bronchodilators and racemic epi, steroids.  10/02/23 improved respiratory symptoms, HDS and on RA. Stable for discharge home.  ______________________________________________  Exam at discharge:  General: WDWN chronically ill appearing M NAD HEENT: Anicteric sclera pink mm healed prior trach scar CV:  RRR s1s2 cap refill brisk  Pulm: even and unlabored on RA. Diminished at bases. No wheeze. No stridor.  GI: soft round  GU: defer MSK: BLE edema. 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  -suspect that volume overload + ETT irritation likely compounded possible SG stenosis causing some stridor P -much improved after HD, suggesting volume played a significant role  -He is saturating in the 90s on room air on morning of discharge  -will discharge with PRN albuterol  inhaler & discussed strict return to ED instructions -follow up with your PCP in 1-2 weeks  -If symptoms recur suggesting subglottic stenosis, may need ENT referral   Aflutter s/p ablation -successful ablation 8/21 P -d/w EP -- stop amiodarone   -continue eliquis    ESRD Volume overload  P -continue TThSa HD -His BP is significant improved from pre-hospital (pre-ablation when HR was 140s). He did receive isolated midodrine  dose this admission but am not discharging with new Rx for midodrine  as his BP is significant improved (SBP 138 morning of discharge) which I suspect is largely related to improved HR  -hopefully with his improved BP, he will be able to get more volume off outpatient but if his sessions are limited, it may be that midodrine  may be considered outpatient   HFpEF  CAD  Hx Dresslers Hx cardiac tamponade Hx prior cardiac arrest  Hx VA ECMO P -continue statin -follow up as planned with cardiology output   __________________________________________________  Labs at discharge Lab Results  Component Value Date   CREATININE 6.13 (H) 10/01/2023   BUN 24 (H) 10/01/2023   NA 143 10/01/2023   K 3.9 10/01/2023   CL 98 10/01/2023   CO2 27 10/01/2023   Lab Results  Component Value Date   WBC 8.4 10/01/2023   HGB 11.3 (L) 10/01/2023   HCT 35.3 (L) 10/01/2023   MCV 93.1 10/01/2023   PLT 181  10/01/2023   Lab Results  Component Value Date   ALT 10 04/29/2023   AST 11 04/29/2023   ALKPHOS 153 (H) 04/29/2023   BILITOT 0.5 04/29/2023   Lab Results  Component Value Date   INR 1.1 11/20/2021   INR 1.1 11/19/2021   INR 1.1 11/14/2021     Current radiology studies DG CHEST PORT 1 VIEW Result Date: 10/01/2023 CLINICAL DATA:  Acute hypoxic respiratory failure. EXAM: PORTABLE CHEST 1 VIEW COMPARISON:  10/11/2022 FINDINGS: Prior median sternotomy. Cardiomegaly is stable. Bilateral pleural effusions and ill-defined basilar opacities. Vascular congestion. No pneumothorax. IMPRESSION: 1. Cardiomegaly with vascular congestion. 2. Bilateral pleural effusions and ill-defined basilar opacities, likely atelectasis. Electronically Signed   By: Andrea Gasman M.D.   On: 10/01/2023 15:38   EP STUDY Result Date: 10/01/2023  SURGEON:  Eulas FORBES Furbish, MD    PREPROCEDURE DIAGNOSIS:  Atrial flutter.    POSTPROCEDURE DIAGNOSIS:  Isthmus-dependent counter clockwise right atrial flutter.    PROCEDURES: Comprehensive EP study Coronary sinus pacing and recording 3D Mapping of supraventricular tachycardia Radiofrequency ablation of supraventricular tachycardia -- CTI for atrial flutter    INTRODUCTION: [name] is a 63 y.o. male with a history of typical appearing atrial flutter who presents today for EP study and radiofrequency ablation.  He has had progressive heart failure with flutter and RVR resulting in hypotension and difficulty pulling fluid in dialysis. I scheduled this procedure urgently as he has failed DC cardioversion and has been progressively worsening with CHF. The patient has been adequately anticoagulated for three weeks and now presents for EP study and radiofrequency ablation of atrial flutter.   DESCRIPTION OF PROCEDURE:  Informed written consent was obtained and the patient was brought to the Electrophysiology Lab in the fasting state. The patient was adequately sedated with intravenous medication as outlined in the anesthesia report.  The patient's bilateral groins were prepped and draped in the usual sterile fashion by the EP Lab staff.  Using ultrasound guidance and modified Seldinger technique, one 9-French sheath were placed in  the right groin and a 9 Fr and a 7 Fr sheath placed in the right groin. An 8-French intracardiac echo catheter was advanced through the 9-French sheath to the right atrium. Baseline intracardiac echo exam revealed trivial pericardial effusion and normal left ventricular function. The CTI was suitable for ablation. A 7-French Biosense Webster decapolar catheter was introduced through the right common femoral vein and advanced into the coronary sinus for recording and pacing from this location.  A Biosense Webster 8mm irrigated ablation catheter was then advanced to the right atrium. A limited electroanatomic map was created of the relevant structures and the His annotated. Presenting Measurements: The patient presented to the Electrophysiology Lab in atrial flutter.  The surface electrocardiogram was consistent with typical atrial flutter.  The atrial flutter cycle length was 270 milliseconds.  The coronary sinus catheter activation revealed proximal to distal activation and was therefore suggestive of right atrial flutter.  The patient's QRS duration was 132 milliseconds with a QT interval of 446 milliseconds and an HV interval of 62 milliseconds.  Mapping: We performed an electroanatomic map of the right atrium and captured greater than 90% of the cycle length in the RA.  The activation map demonstrated counterclockwise reentry around the tricuspid valve annulus. The patient was therefore felt to have isthmus-dependent right atrial flutter.    I therefore elected to perform cavotricuspid isthmus ablation today. Ablation: The ablation catheter was positioned along the cavotricuspid isthmus and  a series of radiofrequency applications were delivered with up to 60 Watts.  The tachycardia slowed and then terminated during radiofrequency application. Additional radiofrequency lesions were delivered to consolidate the ablated line along the isthmus.  Bidirectional block was demonstrated across the CTI by pacing and  recording from the proximal CS electrodes and the ablation catheter positioned lateral to the ablated line. Differential pacing also confirmed block across the CTI. The stimulus to earliest activation across the isthmus bidirectionally measured > 190 msec.  I observed the patient for approximately 10 minutes before opting to conclude the case. I  felt that, due to his tenuous respiratory status, it would be best to limit anesthesia time as much as possible. Measurements following ablation: Following ablation, the AH interval measured 68 milliseconds with an HV interval of 62 milliseconds.  Atrial pacing was performed, which revealed decremental AV conduction with no evidence of PR greater than RR.  The AV Wenckebach cycle length was 400 milliseconds.  Atrial pacing was continued down to a cycle length of 200 milliseconds with no arrhythmias induced.  No arrhythmias were induced. The procedure was therefore considered completed.  All catheters were removed and the sheaths were aspirated and flushed.  The sheaths were removed and hemostasis achieved with manual pressure. EBL<37ml.  The patient tolerated the procedure well without immediate complication.    CONCLUSIONS:  1. Isthmus-dependent counter clockwise right atrial flutter.  2. Successful radiofrequency ablation of atrial flutter along the cavotricuspid isthmus with complete bidirectional isthmus block achieved.  3. No inducible arrhythmias following ablation.  4. No early apparent complications.   Eulas FORBES Furbish, MD 10/01/2023 10:16 AM    Disposition: home      Allergies as of 10/02/2023   No Known Allergies      Medication List     STOP taking these medications    amiodarone  200 MG tablet Commonly known as: Pacerone        TAKE these medications    albuterol  108 (90 Base) MCG/ACT inhaler Commonly known as: VENTOLIN  HFA Inhale 1-2 puffs into the lungs every 4 (four) hours as needed for wheezing or shortness of breath.   atorvastatin   80 MG tablet Commonly known as: LIPITOR  Take 1 tablet (80 mg total) by mouth daily.   cycloSPORINE  0.05 % ophthalmic emulsion Commonly known as: RESTASIS  Place 1 drop into both eyes 2 (two) times daily.   Eliquis  5 MG Tabs tablet Generic drug: apixaban  Take 1 tablet (5 mg total) by mouth 2 (two) times daily.   LOPERAMIDE  HCL PO Take 2 mg by mouth.   MIRCERA IJ Mircera   multivitamin Tabs tablet Take 1 tablet by mouth.   SENSIPAR PO Take 60 mg by mouth.   sevelamer  carbonate 800 MG tablet Commonly known as: RENVELA  Take 1,600 mg by mouth.          Discharged Condition: stable  Time spent on discharge greater than 30 minutes.  Vital signs at Discharge. Temp:  [97.2 F (36.2 C)-99 F (37.2 C)] 99 F (37.2 C) (08/22 0824) Pulse Rate:  [76-90] 87 (08/22 0800) Resp:  [8-33] 20 (08/22 0800) BP: (82-138)/(58-89) 110/63 (08/22 0800) SpO2:  [86 %-100 %] 100 % (08/22 0800) FiO2 (%):  [50 %] 50 % (08/21 1600) Weight:  [99.4 kg-99.8 kg] 99.4 kg (08/22 0500)   Ronnald Gave MSN, AGACNP-BC [facility] 10/02/2023, 8:25 AM

## 2023-10-02 NOTE — Care Management CC44 (Signed)
 Condition Code 44 Documentation Completed  Patient Details  Name: [name] MRN: [id number] Date of Birth: 10-Feb-1961   Condition Code 44 given:  Yes Patient signature on Condition Code 44 notice:  Yes Documentation of 2 MD's agreement:  Yes Code 44 added to claim:  Yes    Justina Delcia Czar, RN 10/02/2023, 11:19 AM

## 2023-10-02 NOTE — Progress Notes (Signed)
 [facility] Progress Note  Subjective:    Vitals:   10/02/23 0700 10/02/23 0800 10/02/23 0824 10/02/23 1050  BP: 138/78 110/63  109/67  Pulse: 90 87  93  Resp: 20 20    Temp:   99 F (37.2 C)   TempSrc:   Oral   SpO2: 94% 100%  93%  Weight:      Height:        Exam: Gen alert, no distress No jvd or bruits Chest clear bilat to bases RRR no MRG Abd soft ntnd no mass or ascites +bs GU deferred MS no joint effusions or deformity Ext trace LE or UE edema Neuro is alert, Ox 3 , nf    Right arm aVF+bruit    Home bp meds: none    OP HD: TTS [facility] 4h  B400   98.5kg   2K bath  AVF  Heparin  none Last OP HD 8/20, post wt 101.6 Comes off 2-5 kg over last 2 wks   CXR bilat vasc congestion +/- IS edema   Assessment/ Plan: S/P Aflutter ablation: per cardiology  Resp distress: in recovery, on bipap. CXR w/ vasc congestion. Had HD here last night, feeling better today. 3 L off.  ESRD: on HD TTS. Had HD overnight. For dc today.  BP: not on any bp lowering meds, or midodrine , at home.  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       Myer Fret MD  [facility] 10/02/2023, 2:52 PM  Recent Labs  Lab 09/25/23 1629 10/01/23 0842 10/01/23 1320  HGB 10.6* 12.2* 11.3*  CALCIUM  7.9*  --  8.5*  CREATININE 7.63* 5.50* 6.13*  K 3.8 3.4* 3.9   No results for input(s): IRON, TIBC, FERRITIN in the last 168 hours. Inpatient medications:  albuterol   1-2 puff Inhalation Q6H   apixaban   5 mg Oral BID   atorvastatin   80 mg Oral Daily   Chlorhexidine  Gluconate Cloth  6 each Topical Q0600   cycloSPORINE   1 drop Both Eyes BID   sodium chloride  flush  3 mL Intravenous Q12H    sodium chloride      anticoagulant sodium citrate      sodium chloride , acetaminophen  **OR** acetaminophen , alteplase , anticoagulant sodium citrate , calcium  carbonate (dosed in mg elemental calcium ), feeding supplement (NEPRO CARB STEADY), heparin , lidocaine  (PF), lidocaine -prilocaine ,  pentafluoroprop-tetrafluoroeth, sodium chloride  flush

## 2023-10-02 NOTE — Discharge Planning (Signed)
 [facility] Patient Discharge Orders - [facility] CLINIC: [facility]  Patient's name: [name] Admit/DC Dates: 10/01/2023 - 10/02/2023  DISCHARGE DIAGNOSES: Elective a-flutter ablation  ESRD on HD   HD ORDER CHANGES: Heparin  change: no heparin  EDW Change: no  Bath Change: no change  ANEMIA MANAGEMENT: Aranesp : Given: no    ESA dose for discharge: mircera 75 mcg IV q 2 weeks, to start on 10/13/23 IV Iron dose at discharge: on hold due to high ferritin Transfusion: Given: not given  BONE/MINERAL MEDICATIONS: Hectorol/Calcitriol change: no change - continue 1.75 mcg Sensipar/Parsabiv change: no change - continue 60 mg  ACCESS INTERVENTION/CHANGE: no change Details:   RECENT LABS: Recent Labs  Lab 10/01/23 1320  HGB 11.3*  NA 143  K 3.9  CALCIUM  8.5*    IV ANTIBIOTICS: no Details:    OTHER/APPTS/LAB ORDERS: Please draw updated labs at next HD  D/C Meds to be reconciled by nurse after every discharge.  Completed By: Belvie Och, NP   Reviewed by: MD:______ RN_______

## 2023-10-02 NOTE — TOC Transition Note (Signed)
 Transition of Care [facility]) - Discharge Note   Patient Details  Name: [name] MRN: [id number] Date of Birth: 02-01-1961  [facility] CM/SW Contact:  Justina Delcia Czar, RN Phone Number: [phone] 10/02/2023, 10:21 AM   Clinical Narrative:    Spoke to pt and states he is independent pta. Has transportation to appts. Family will provide transportation to home at dc.    Final next level of care: Home/Self Care Barriers to Discharge: No Barriers Identified   Patient Goals and CMS Choice            Discharge Placement       Discharge Plan and Services Additional resources added to the After Visit Summary for     Discharge Planning Services: CM Consult             Social Drivers of Health (SDOH) Interventions SDOH Screenings   Food Insecurity: No Food Insecurity (04/27/2023)  Housing: Low Risk  (04/27/2023)  Transportation Needs: No Transportation Needs (04/27/2023)  Utilities: Not At Risk (12/06/2021)  Alcohol  Screen: Low Risk  (04/29/2023)  Depression (PHQ2-9): Low Risk  (04/29/2023)  Financial Resource Strain: Low Risk  (04/27/2023)  Physical Activity: Unknown (04/27/2023)  Social Connections: Socially Isolated (04/27/2023)  Stress: No Stress Concern Present (04/27/2023)  Tobacco Use: Medium Risk (10/01/2023)  Health Literacy: Adequate Health Literacy (04/29/2023)     Readmission Risk Interventions    09/20/2021   11:57 AM  Readmission Risk Prevention Plan  Post Dischage Appt Complete  Medication Screening Complete  Transportation Screening Complete

## 2023-10-03 ENCOUNTER — Telehealth: Payer: Self-pay | Admitting: Nephrology

## 2023-10-03 DIAGNOSIS — N186 End stage renal disease: Secondary | ICD-10-CM | POA: Diagnosis not present

## 2023-10-03 DIAGNOSIS — E876 Hypokalemia: Secondary | ICD-10-CM | POA: Diagnosis not present

## 2023-10-03 DIAGNOSIS — D631 Anemia in chronic kidney disease: Secondary | ICD-10-CM | POA: Diagnosis not present

## 2023-10-03 DIAGNOSIS — E8779 Other fluid overload: Secondary | ICD-10-CM | POA: Diagnosis not present

## 2023-10-03 DIAGNOSIS — N2581 Secondary hyperparathyroidism of renal origin: Secondary | ICD-10-CM | POA: Diagnosis not present

## 2023-10-03 DIAGNOSIS — Z992 Dependence on renal dialysis: Secondary | ICD-10-CM | POA: Diagnosis not present

## 2023-10-03 NOTE — Telephone Encounter (Signed)
 Transition of care contact from inpatient facility  Date of Discharge: 10/02/23 Date of Contact: 10/03/23 Method of contact: Phone  Attempted to contact patient to discuss transition of care from inpatient admission. Patient did not answer the phone. Message was left on the patient's voicemail with call back number.

## 2023-10-06 DIAGNOSIS — N2581 Secondary hyperparathyroidism of renal origin: Secondary | ICD-10-CM | POA: Diagnosis not present

## 2023-10-06 DIAGNOSIS — N186 End stage renal disease: Secondary | ICD-10-CM | POA: Diagnosis not present

## 2023-10-06 DIAGNOSIS — E8779 Other fluid overload: Secondary | ICD-10-CM | POA: Diagnosis not present

## 2023-10-06 DIAGNOSIS — D631 Anemia in chronic kidney disease: Secondary | ICD-10-CM | POA: Diagnosis not present

## 2023-10-06 DIAGNOSIS — Z992 Dependence on renal dialysis: Secondary | ICD-10-CM | POA: Diagnosis not present

## 2023-10-06 DIAGNOSIS — E876 Hypokalemia: Secondary | ICD-10-CM | POA: Diagnosis not present

## 2023-10-07 ENCOUNTER — Other Ambulatory Visit: Payer: Self-pay

## 2023-10-07 MED ORDER — ACETAMINOPHEN-CODEINE 300-30 MG PO TABS
1.0000 | ORAL_TABLET | ORAL | 0 refills | Status: AC | PRN
  Filled 2023-10-07: qty 28, 5d supply, fill #0

## 2023-10-07 MED ORDER — IBUPROFEN 800 MG PO TABS
800.0000 mg | ORAL_TABLET | Freq: Four times a day (QID) | ORAL | 0 refills | Status: AC | PRN
  Filled 2023-10-07: qty 28, 7d supply, fill #0

## 2023-10-08 DIAGNOSIS — N186 End stage renal disease: Secondary | ICD-10-CM | POA: Diagnosis not present

## 2023-10-08 DIAGNOSIS — E876 Hypokalemia: Secondary | ICD-10-CM | POA: Diagnosis not present

## 2023-10-08 DIAGNOSIS — Z992 Dependence on renal dialysis: Secondary | ICD-10-CM | POA: Diagnosis not present

## 2023-10-08 DIAGNOSIS — E1129 Type 2 diabetes mellitus with other diabetic kidney complication: Secondary | ICD-10-CM | POA: Diagnosis not present

## 2023-10-08 DIAGNOSIS — I483 Typical atrial flutter: Secondary | ICD-10-CM | POA: Diagnosis not present

## 2023-10-08 DIAGNOSIS — E8779 Other fluid overload: Secondary | ICD-10-CM | POA: Diagnosis not present

## 2023-10-08 DIAGNOSIS — D631 Anemia in chronic kidney disease: Secondary | ICD-10-CM | POA: Diagnosis not present

## 2023-10-08 DIAGNOSIS — N2581 Secondary hyperparathyroidism of renal origin: Secondary | ICD-10-CM | POA: Diagnosis not present

## 2023-10-10 DIAGNOSIS — N186 End stage renal disease: Secondary | ICD-10-CM | POA: Diagnosis not present

## 2023-10-10 DIAGNOSIS — E876 Hypokalemia: Secondary | ICD-10-CM | POA: Diagnosis not present

## 2023-10-10 DIAGNOSIS — E8779 Other fluid overload: Secondary | ICD-10-CM | POA: Diagnosis not present

## 2023-10-10 DIAGNOSIS — N2581 Secondary hyperparathyroidism of renal origin: Secondary | ICD-10-CM | POA: Diagnosis not present

## 2023-10-10 DIAGNOSIS — Z992 Dependence on renal dialysis: Secondary | ICD-10-CM | POA: Diagnosis not present

## 2023-10-10 DIAGNOSIS — D631 Anemia in chronic kidney disease: Secondary | ICD-10-CM | POA: Diagnosis not present

## 2023-10-11 DIAGNOSIS — E1129 Type 2 diabetes mellitus with other diabetic kidney complication: Secondary | ICD-10-CM | POA: Diagnosis not present

## 2023-10-11 DIAGNOSIS — Z992 Dependence on renal dialysis: Secondary | ICD-10-CM | POA: Diagnosis not present

## 2023-10-11 DIAGNOSIS — N186 End stage renal disease: Secondary | ICD-10-CM | POA: Diagnosis not present

## 2023-10-13 ENCOUNTER — Other Ambulatory Visit: Payer: Self-pay

## 2023-10-13 ENCOUNTER — Emergency Department (HOSPITAL_COMMUNITY)

## 2023-10-13 ENCOUNTER — Observation Stay (HOSPITAL_COMMUNITY)
Admission: EM | Admit: 2023-10-13 | Discharge: 2023-10-14 | Disposition: A | Discharge status: Home / Self Care | Attending: Hospitalist | Admitting: Hospitalist

## 2023-10-13 ENCOUNTER — Encounter (HOSPITAL_COMMUNITY): Payer: Self-pay | Admitting: Emergency Medicine

## 2023-10-13 DIAGNOSIS — R918 Other nonspecific abnormal finding of lung field: Secondary | ICD-10-CM | POA: Diagnosis not present

## 2023-10-13 DIAGNOSIS — R42 Dizziness and giddiness: Secondary | ICD-10-CM | POA: Diagnosis not present

## 2023-10-13 DIAGNOSIS — Z87891 Personal history of nicotine dependence: Secondary | ICD-10-CM | POA: Insufficient documentation

## 2023-10-13 DIAGNOSIS — Z951 Presence of aortocoronary bypass graft: Secondary | ICD-10-CM | POA: Diagnosis not present

## 2023-10-13 DIAGNOSIS — E1122 Type 2 diabetes mellitus with diabetic chronic kidney disease: Secondary | ICD-10-CM | POA: Insufficient documentation

## 2023-10-13 DIAGNOSIS — R5381 Other malaise: Secondary | ICD-10-CM | POA: Insufficient documentation

## 2023-10-13 DIAGNOSIS — Z7901 Long term (current) use of anticoagulants: Secondary | ICD-10-CM | POA: Insufficient documentation

## 2023-10-13 DIAGNOSIS — I120 Hypertensive chronic kidney disease with stage 5 chronic kidney disease or end stage renal disease: Secondary | ICD-10-CM | POA: Insufficient documentation

## 2023-10-13 DIAGNOSIS — E1149 Type 2 diabetes mellitus with other diabetic neurological complication: Secondary | ICD-10-CM | POA: Diagnosis present

## 2023-10-13 DIAGNOSIS — Z8679 Personal history of other diseases of the circulatory system: Secondary | ICD-10-CM

## 2023-10-13 DIAGNOSIS — N186 End stage renal disease: Secondary | ICD-10-CM | POA: Insufficient documentation

## 2023-10-13 DIAGNOSIS — R0602 Shortness of breath: Secondary | ICD-10-CM | POA: Diagnosis present

## 2023-10-13 DIAGNOSIS — E875 Hyperkalemia: Secondary | ICD-10-CM | POA: Diagnosis not present

## 2023-10-13 DIAGNOSIS — I4892 Unspecified atrial flutter: Secondary | ICD-10-CM | POA: Diagnosis not present

## 2023-10-13 DIAGNOSIS — Z8673 Personal history of transient ischemic attack (TIA), and cerebral infarction without residual deficits: Secondary | ICD-10-CM | POA: Diagnosis not present

## 2023-10-13 DIAGNOSIS — R0989 Other specified symptoms and signs involving the circulatory and respiratory systems: Secondary | ICD-10-CM | POA: Diagnosis not present

## 2023-10-13 DIAGNOSIS — J90 Pleural effusion, not elsewhere classified: Secondary | ICD-10-CM | POA: Diagnosis not present

## 2023-10-13 DIAGNOSIS — Z9889 Other specified postprocedural states: Secondary | ICD-10-CM | POA: Diagnosis present

## 2023-10-13 DIAGNOSIS — I10 Essential (primary) hypertension: Secondary | ICD-10-CM | POA: Diagnosis present

## 2023-10-13 DIAGNOSIS — R5383 Other fatigue: Secondary | ICD-10-CM | POA: Diagnosis not present

## 2023-10-13 DIAGNOSIS — E1169 Type 2 diabetes mellitus with other specified complication: Secondary | ICD-10-CM | POA: Diagnosis not present

## 2023-10-13 DIAGNOSIS — I3139 Other pericardial effusion (noninflammatory): Secondary | ICD-10-CM | POA: Insufficient documentation

## 2023-10-13 DIAGNOSIS — I2510 Atherosclerotic heart disease of native coronary artery without angina pectoris: Secondary | ICD-10-CM | POA: Insufficient documentation

## 2023-10-13 DIAGNOSIS — E8770 Fluid overload, unspecified: Principal | ICD-10-CM | POA: Diagnosis present

## 2023-10-13 DIAGNOSIS — E1159 Type 2 diabetes mellitus with other circulatory complications: Secondary | ICD-10-CM | POA: Insufficient documentation

## 2023-10-13 DIAGNOSIS — I480 Paroxysmal atrial fibrillation: Secondary | ICD-10-CM | POA: Diagnosis present

## 2023-10-13 DIAGNOSIS — Z79899 Other long term (current) drug therapy: Secondary | ICD-10-CM | POA: Diagnosis not present

## 2023-10-13 DIAGNOSIS — R112 Nausea with vomiting, unspecified: Secondary | ICD-10-CM | POA: Diagnosis present

## 2023-10-13 DIAGNOSIS — N19 Unspecified kidney failure: Principal | ICD-10-CM

## 2023-10-13 DIAGNOSIS — Z992 Dependence on renal dialysis: Secondary | ICD-10-CM | POA: Diagnosis not present

## 2023-10-13 LAB — CBC
HCT: 33 % — ABNORMAL LOW (ref 39.0–52.0)
Hemoglobin: 10.6 g/dL — ABNORMAL LOW (ref 13.0–17.0)
MCH: 29.7 pg (ref 26.0–34.0)
MCHC: 32.1 g/dL (ref 30.0–36.0)
MCV: 92.4 fL (ref 80.0–100.0)
Platelets: 213 K/uL (ref 150–400)
RBC: 3.57 MIL/uL — ABNORMAL LOW (ref 4.22–5.81)
RDW: 17.1 % — ABNORMAL HIGH (ref 11.5–15.5)
WBC: 10.8 K/uL — ABNORMAL HIGH (ref 4.0–10.5)
nRBC: 0 % (ref 0.0–0.2)

## 2023-10-13 LAB — COMPREHENSIVE METABOLIC PANEL WITH GFR
ALT: 13 U/L (ref 0–44)
AST: 13 U/L — ABNORMAL LOW (ref 15–41)
Albumin: 3.1 g/dL — ABNORMAL LOW (ref 3.5–5.0)
Alkaline Phosphatase: 121 U/L (ref 38–126)
Anion gap: 20 — ABNORMAL HIGH (ref 5–15)
BUN: 88 mg/dL — ABNORMAL HIGH (ref 8–23)
CO2: 24 mmol/L (ref 22–32)
Calcium: 8.1 mg/dL — ABNORMAL LOW (ref 8.9–10.3)
Chloride: 93 mmol/L — ABNORMAL LOW (ref 98–111)
Creatinine, Ser: 10.21 mg/dL — ABNORMAL HIGH (ref 0.61–1.24)
GFR, Estimated: 5 mL/min — ABNORMAL LOW (ref >60)
Glucose, Bld: 117 mg/dL — ABNORMAL HIGH (ref 70–99)
Potassium: 5.4 mmol/L — ABNORMAL HIGH (ref 3.5–5.1)
Sodium: 137 mmol/L (ref 135–145)
Total Bilirubin: 1.1 mg/dL (ref 0.0–1.2)
Total Protein: 6.6 g/dL (ref 6.5–8.1)

## 2023-10-13 LAB — CBG MONITORING, ED: Glucose-Capillary: 102 mg/dL — ABNORMAL HIGH (ref 70–99)

## 2023-10-13 MED ORDER — SODIUM ZIRCONIUM CYCLOSILICATE 5 G PO PACK
5.0000 g | PACK | Freq: Once | ORAL | Status: AC
  Administered 2023-10-13: 5 g via ORAL
  Filled 2023-10-13: qty 1

## 2023-10-13 NOTE — ED Triage Notes (Signed)
 BIB [facility] from home, woke up feeling dizzy with malaise. No dialysis today because of dizziness. Normally Tue, Thurs, Sat. Dizziness worsening throughout day. Pt states that he vomited this AM.   BP 112/*64 HR 72 Spo2 96% CBG 125

## 2023-10-13 NOTE — ED Provider Notes (Signed)
 [facility] DEPARTMENT AT [facility] Provider Note   CSN: [id number] Arrival date & time: 10/13/23  2020     Patient presents with: Dizziness and Nausea   [name] is a 63 y.o. male.  {Add pertinent medical, surgical, social history, OB history to HPI:32947} HPI     Prior to Admission medications   Medication Sig Start Date End Date Taking? Authorizing Provider  acetaminophen -codeine  (TYLENOL  #3) 300-30 MG tablet Take 1 tablet by mouth every 4 (four) to 6 (six) hours as needed FOR PAIN. 10/07/23     albuterol  (VENTOLIN  HFA) 108 (90 Base) MCG/ACT inhaler Inhale 1-2 puffs into the lungs every 4 (four) hours as needed for wheezing or shortness of breath. 10/02/23   Bowser, Ronnald BRAVO, NP  apixaban  (ELIQUIS ) 5 MG TABS tablet Take 1 tablet (5 mg total) by mouth 2 (two) times daily. 10/15/22   Lelon Hamilton T, PA-C  atorvastatin  (LIPITOR ) 80 MG tablet Take 1 tablet (80 mg total) by mouth daily. 10/15/22   Lelon Hamilton T, PA-C  Cinacalcet HCl (SENSIPAR PO) Take 60 mg by mouth. 07/28/23 07/26/24  [provider]  ibuprofen  (IBU) 800 MG tablet Take 1 tablet (800 mg total) by mouth every 6 (six) hours as needed FOR PAIN. 10/07/23     LOPERAMIDE  HCL PO Take 2 mg by mouth. 05/14/23 05/12/24  [provider]  Methoxy PEG-Epoetin Beta (MIRCERA IJ) Mircera 04/16/23 04/14/24  [provider]  multivitamin (RENA-VIT) TABS tablet Take 1 tablet by mouth. 12/03/21   [provider]  sevelamer  carbonate (RENVELA ) 800 MG tablet Take 1,600 mg by mouth.    [provider]    Allergies: Patient has no known allergies.    Review of Systems  Updated Vital Signs BP 121/74   Pulse 71   Temp 97.6 F (36.4 C) (Oral)   Resp 18   Ht 5' 7 (1.702 m)   Wt 102.1 kg   SpO2 99%   BMI 35.24 kg/m   Physical Exam  (all labs ordered are listed, but only abnormal results are displayed) Labs Reviewed  COMPREHENSIVE METABOLIC PANEL WITH GFR - Abnormal; Notable  for the following components:      Result Value   Potassium 5.4 (*)    Chloride 93 (*)    Glucose, Bld 117 (*)    BUN 88 (*)    Creatinine, Ser 10.21 (*)    Calcium  8.1 (*)    Albumin  3.1 (*)    AST 13 (*)    GFR, Estimated 5 (*)    Anion gap 20 (*)    All other components within normal limits  CBC - Abnormal; Notable for the following components:   WBC 10.8 (*)    RBC 3.57 (*)    Hemoglobin 10.6 (*)    HCT 33.0 (*)    RDW 17.1 (*)    All other components within normal limits  CBG MONITORING, ED - Abnormal; Notable for the following components:   Glucose-Capillary 102 (*)    All other components within normal limits    EKG: None  Radiology: No results found.  {Document cardiac monitor, telemetry assessment procedure when appropriate:32947} Procedures   Medications Ordered in the ED - No data to display    {Click here for ABCD2, HEART and other calculators REFRESH Note before signing:1}  Medical Decision Making Amount and/or Complexity of Data Reviewed Labs: ordered.   ***  {Document critical care time when appropriate  Document review of labs and clinical decision tools ie CHADS2VASC2, etc  Document your independent review of radiology images and any outside records  Document your discussion with family members, caretakers and with consultants  Document social determinants of health affecting pt's care  Document your decision making why or why not admission, treatments were needed:32947:::1}   Final diagnoses:  None    ED Discharge Orders     None

## 2023-10-14 ENCOUNTER — Encounter (HOSPITAL_COMMUNITY): Payer: Self-pay | Admitting: Internal Medicine

## 2023-10-14 DIAGNOSIS — Z992 Dependence on renal dialysis: Secondary | ICD-10-CM | POA: Diagnosis not present

## 2023-10-14 DIAGNOSIS — E8770 Fluid overload, unspecified: Secondary | ICD-10-CM | POA: Diagnosis not present

## 2023-10-14 DIAGNOSIS — N186 End stage renal disease: Secondary | ICD-10-CM

## 2023-10-14 DIAGNOSIS — Z951 Presence of aortocoronary bypass graft: Secondary | ICD-10-CM

## 2023-10-14 LAB — RESP PANEL BY RT-PCR (RSV, FLU A&B, COVID)  RVPGX2
Influenza A by PCR: NEGATIVE
Influenza B by PCR: NEGATIVE
Resp Syncytial Virus by PCR: NEGATIVE
SARS Coronavirus 2 by RT PCR: NEGATIVE

## 2023-10-14 LAB — CBG MONITORING, ED: Glucose-Capillary: 70 mg/dL (ref 70–99)

## 2023-10-14 MED ORDER — SEVELAMER CARBONATE 800 MG PO TABS
1600.0000 mg | ORAL_TABLET | Freq: Three times a day (TID) | ORAL | Status: DC
Start: 2023-10-14 — End: 2023-10-14

## 2023-10-14 MED ORDER — LIDOCAINE HCL (PF) 1 % IJ SOLN: 5.0000 mL | INTRAMUSCULAR | Status: DC | PRN

## 2023-10-14 MED ORDER — HEPARIN SODIUM (PORCINE) 1000 UNIT/ML DIALYSIS: 1000.0000 [IU] | INTRAMUSCULAR | Status: DC | PRN

## 2023-10-14 MED ORDER — ALBUTEROL SULFATE (2.5 MG/3ML) 0.083% IN NEBU: 2.5000 mg | INHALATION_SOLUTION | RESPIRATORY_TRACT | Status: DC | PRN

## 2023-10-14 MED ORDER — ACETAMINOPHEN 650 MG RE SUPP: 650.0000 mg | Freq: Four times a day (QID) | RECTAL | Status: DC | PRN

## 2023-10-14 MED ORDER — ATORVASTATIN CALCIUM 80 MG PO TABS: 80.0000 mg | ORAL_TABLET | Freq: Every day | ORAL | Status: DC

## 2023-10-14 MED ORDER — HEPARIN SODIUM (PORCINE) 1000 UNIT/ML DIALYSIS: 40.0000 [IU]/kg | INTRAMUSCULAR | Status: DC | PRN

## 2023-10-14 MED ORDER — RENA-VITE PO TABS
1.0000 | ORAL_TABLET | Freq: Every day | ORAL | Status: DC
  Filled 2023-10-14: qty 1

## 2023-10-14 MED ORDER — ACETAMINOPHEN 325 MG PO TABS: 650.0000 mg | ORAL_TABLET | Freq: Four times a day (QID) | ORAL | Status: DC | PRN

## 2023-10-14 MED ORDER — APIXABAN 5 MG PO TABS: 5.0000 mg | ORAL_TABLET | Freq: Two times a day (BID) | ORAL | Status: DC

## 2023-10-14 MED ORDER — SEVELAMER CARBONATE 800 MG PO TABS: 1600.0000 mg | ORAL_TABLET | Freq: Three times a day (TID) | ORAL | Status: AC

## 2023-10-14 MED ORDER — PENTAFLUOROPROP-TETRAFLUOROETH EX AERO: 1.0000 | INHALATION_SPRAY | CUTANEOUS | Status: DC | PRN

## 2023-10-14 MED ORDER — CHLORHEXIDINE GLUCONATE CLOTH 2 % EX PADS: 6.0000 | MEDICATED_PAD | Freq: Every day | CUTANEOUS | Status: DC

## 2023-10-14 MED ORDER — ALTEPLASE 2 MG IJ SOLR: 2.0000 mg | Freq: Once | INTRAMUSCULAR | Status: DC | PRN

## 2023-10-14 MED ORDER — LIDOCAINE-PRILOCAINE 2.5-2.5 % EX CREA: 1.0000 | TOPICAL_CREAM | CUTANEOUS | Status: DC | PRN

## 2023-10-14 MED ORDER — ANTICOAGULANT SODIUM CITRATE 4% (200MG/5ML) IV SOLN
5.0000 mL | Status: DC | PRN
Start: 2023-10-14 — End: 2023-10-14

## 2023-10-14 NOTE — Discharge Planning (Signed)
 [facility] Patient Discharge Orders - [facility] CLINIC: [facility]  Patient's name: [name] Admit/DC Dates: 10/13/2023 - 10/14/23  DISCHARGE DIAGNOSES: Dyspnea/pulm edema -> s/p extra HD today  Hyperkalemia  HD ORDER CHANGES: Heparin  change: no EDW Change: YES New EDW: 97.0kg Bath Change: no  ANEMIA MANAGEMENT: Aranesp : Given: no    ESA dose for discharge: Same as prior IV Iron dose at discharge: Per protocol  Transfusion: Given: no  BONE/MINERAL MEDICATIONS: Hectorol/Calcitriol change: no Sensipar/Parsabiv change: no  ACCESS INTERVENTION/CHANGE: no Details:   RECENT LABS: Recent Labs  Lab 10/13/23 2045  HGB 10.6*  NA 137  K 5.4*  CALCIUM  8.1*  ALBUMIN  3.1*   IV ANTIBIOTICS: no Details:  OTHER ANTICOAGULATION: no Details:  OTHER/APPTS/LAB ORDERS:   D/C Meds to be reconciled by nurse after every discharge.  Completed By: Izetta Boehringer, PA-C [facility] Pager [phone]   Reviewed by: MD:______ RN_______

## 2023-10-14 NOTE — Procedures (Signed)
 S: seen in HD unit, no c/o's, BP's stable  Vitals:   10/14/23 1200 10/14/23 1211 10/14/23 1215 10/14/23 1335  BP: 121/63 (!) 124/59 120/64 125/69  Pulse: 86 86 88 87  Resp: 18 20 20 18   Temp:   97.9 F (36.6 C) 97.7 F (36.5 C)  TempSrc:    Oral  SpO2: 100% 100% 96% 100%  Weight:      Height:        Recent Labs  Lab 10/13/23 2045  HGB 10.6*  ALBUMIN  3.1*  CALCIUM  8.1*  CREATININE 10.21*  K 5.4*    Inpatient medications:  apixaban   5 mg Oral BID   atorvastatin   80 mg Oral Daily   Chlorhexidine  Gluconate Cloth  6 each Topical Q0600   multivitamin  1 tablet Oral QHS   sevelamer  carbonate  1,600 mg Oral TID WC    acetaminophen  **OR** acetaminophen , albuterol   I was present at the procedure, reviewed the HD regimen and made appropriate changes.   Myer Fret MD  [facility] 10/14/2023, 2:09 PM

## 2023-10-14 NOTE — H&P (Signed)
 History and Physical    [name] FMW:[id number] DOB: Mar 19, 1960 DOA: 10/13/2023  Patient coming from: Home.  Chief Complaint: Not feeling well.  HPI: [name] is a 63 y.o. 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  The above symptoms made patient to sleep more and missed his dialysis.  ED Course: In the ER patient appears nonfocal.  Chest x-ray shows pleural effusion.  Requiring 2 L oxygen .  Patient is able to ambulate without difficulty.  Labs show mild hyperkalemia.  Was given calcium  gluconate and Lokelma .  Dr. Tobie on-call nephrology was consulted for dialysis.  Review of Systems: As per HPI, rest all negative.   Past Medical History:  Diagnosis Date   AKI (acute kidney injury) (HCC)    pt unaware of this   Anginal pain (HCC)    CAD (coronary artery disease)    a. 03/2015 NSTEMI: LHC with severe 3V CAD  (70% mid RCA, 95% OM1, 90% distal LCx, 90% OM3, 80% prox LAD and 90% ost D1) s/p DES to mLAD w/ small dissction Rx with DES, staged ost Ramus PCI/DES and dLCx s/p PCI/DES    Chest pain 12/24/2020   Diabetes mellitus type 2 in obese    Diverticulosis    Dyspnea    Dyspnea on exertion 03/16/2015   Dyspnea on exertion   ESRD (end stage renal disease) (HCC)    Family history of adverse reaction to anesthesia    patient father- pt states after anesthesia his father developed dementia   GERD (gastroesophageal reflux disease)    Hypercholesteremia    Hypertension associated with diabetes (HCC) 03/16/2015   hypertension   NSTEMI (non-ST elevated myocardial infarction) (HCC) 03/17/2015   Obesity    Personal history of ECMO 2023    Stroke (HCC) 2022   pt states he had a mini stroke during cardiac catheterization   Tobacco abuse    TTHS- [address]     Past Surgical History:  Procedure Laterality Date   A-FLUTTER ABLATION N/A 10/01/2023   Procedure: A-FLUTTER ABLATION;  Surgeon: Nancey Eulas BRAVO, MD;  [facility];  Service: Cardiovascular;  Laterality: N/A;   A/V SHUNT INTERVENTION Right 09/07/2023   Procedure: A/V SHUNT INTERVENTION;  Surgeon: Magda Debby SAILOR, MD;  Location: [facility];  Service: Cardiovascular;  Laterality: Right;   APPLICATION OF WOUND VAC N/A 10/03/2021   Procedure: APPLICATION OF WOUND VAC;  Surgeon: Obadiah Coy, MD;  Location: [facility];  Service: Thoracic;  Laterality: N/A;   AV FISTULA PLACEMENT Right 03/14/2022   Procedure: RIGHT ARM ARTERIOVENOUS (AV) FISTULA VERSUS ARTERIOVENOUS GRAFT CREATION;  Surgeon: Serene Gaile ORN, MD;  Location: [facility] OR;  Service: Vascular;  Laterality: Right;   BASCILIC VEIN TRANSPOSITION Right 09/26/2022   Procedure: RIGHT ARM SECOND STAGE BASILIC VEIN TRANSPOSITION;  Surgeon: Serene Gaile ORN, MD;  [facility]: [facility];  Service: Vascular;  Laterality: Right;   CANNULATION FOR ECMO (EXTRACORPOREAL MEMBRANE OXYGENATION) N/A 09/29/2021   Procedure: CANNULATION FOR ECMO (EXTRACORPOREAL MEMBRANE OXYGENATION);  Surgeon: Shyrl Linnie KIDD, MD;  Location: [facility] OR;  Service: Open Heart Surgery;  Laterality: N/A;   CARDIAC CATHETERIZATION N/A 03/19/2015   Procedure: Left Heart Cath  and Coronary Angiography;  Surgeon: Dorn JINNY Lesches, MD;  Location: [facility] INVASIVE CV LAB;  Service: Cardiovascular;  Laterality: N/A;   CARDIAC CATHETERIZATION N/A 03/20/2015   Procedure: Coronary Stent Intervention;  Surgeon: Dorn JINNY Lesches, MD;  [facility];  Service: Cardiovascular;  Laterality: N/A;   CARDIAC CATHETERIZATION N/A 03/22/2015   Procedure: Coronary Stent Intervention;  Surgeon: Dorn JINNY Lesches, MD;  [facility];  Service:  Cardiovascular;  Laterality: N/A;   CARDIOVERSION N/A 08/17/2023   Procedure: CARDIOVERSION;  Surgeon: Mona Vinie BROCKS, MD;  [facility];  Service: Cardiovascular;  Laterality: N/A;   CORONARY ARTERY BYPASS GRAFT N/A 09/12/2021   Procedure: CORONARY ARTERY BYPASS GRAFTING (CABG) X 5 USING LEFT INTERNAL MAMMARY ARTERY AND ENDOSCOPICALLY HARVESTED RIGHT GREATER SAPHENOUS VEIN.;  Surgeon: Lucas Dorise POUR, MD;  [facility];  Service: Open Heart Surgery;  Laterality: N/A;   EXPLORATION POST OPERATIVE OPEN HEART N/A 09/30/2021   Procedure: EXPLORATION POST OPERATIVE OPEN HEART WASHOUT;  Surgeon: Shyrl Linnie KIDD, MD;  [facility];  Service: Open Heart Surgery;  Laterality: N/A;   IR FLUORO GUIDE CV LINE LEFT  11/04/2021   IR FLUORO GUIDE CV LINE LEFT  12/06/2021   IR FLUORO GUIDE CV LINE LEFT  04/11/2022   IR GASTROSTOMY TUBE MOD SED  11/15/2021   IR GASTROSTOMY TUBE MOD SED  11/20/2021   IR GASTROSTOMY TUBE REMOVAL  01/20/2022   IR US  GUIDE VASC ACCESS LEFT  11/04/2021   IR VENOCAVAGRAM SVC  12/06/2021   IR VENOCAVAGRAM SVC  04/11/2022   LEFT HEART CATH AND CORONARY ANGIOGRAPHY N/A 12/25/2020   Procedure: LEFT HEART CATH AND CORONARY ANGIOGRAPHY;  Surgeon: Burnard Debby LABOR, MD;  [facility];  Service: Cardiovascular;  Laterality: N/A;   LEFT HEART CATH AND CORONARY ANGIOGRAPHY N/A 12/26/2020   Procedure: LEFT HEART CATH AND CORONARY ANGIOGRAPHY;  Surgeon: Burnard Debby LABOR, MD;  [facility];  Service: Cardiovascular;  Laterality: N/A;   MEDIASTINAL EXPLORATION N/A 10/03/2021   Procedure: MEDIASTINAL WASHOUT;  Surgeon: Obadiah Coy, MD;  [facility];  Service: Thoracic;  Laterality: N/A;  PUMP STANDBY   MEDIASTINAL EXPLORATION N/A 10/12/2021   Procedure: MEDIASTINAL WASHOUT;  Surgeon: Lucas Dorise POUR, MD;  [facility];  Service: Thoracic;  Laterality: N/A;   PERCUTANEOUS TRACHEOSTOMY N/A 10/17/2021   Procedure: PERCUTANEOUS TRACHEOSTOMY USING  SHILEY FLEXIBLE 8 mm CUFFED TRACH.;  Surgeon: Claudene Toribio BROCKS, MD;  Location: [facility] OR;  Service: Pulmonary;  Laterality: N/A;  Percutaneous tracheostomy   PERICARDIAL WINDOW Right 09/28/2021   Procedure: PERICARDIAL WINDOW;  Surgeon: Shyrl Linnie KIDD, MD;  [facility];  Service: Thoracic;  Laterality: Right;  Right VATS.  Lazy lateral.  double lumen ET tube   PLACEMENT OF IMPELLA LEFT VENTRICULAR ASSIST DEVICE N/A 10/07/2021   Procedure: INSERTION OF RIGHT AXILLARY IMPELLA, DECANNULATION OF ECMO, AND MEDIASTINAL WASHOUT;  Surgeon: Lucas Dorise POUR, MD;  [facility];  Service: Open Heart Surgery;  Laterality: N/A;  Open right axillary with 8 mm Hemashield Platinum Vascular graft.   RADIOLOGY WITH ANESTHESIA N/A 11/20/2021   Procedure: G-Tube Placement;  Surgeon: Adele Rush, MD;  Location[facility] OR;  Service: Radiology;  Laterality: N/A;   REMOVAL OF IMPELLA LEFT VENTRICULAR ASSIST DEVICE Right 10/17/2021   Procedure: REMOVAL OF IMPELLA LEFT VENTRICULAR ASSIST DEVICE;  Surgeon: Lucas Dorise POUR, MD;  [facility];  Service: Open Heart Surgery;  Laterality: Right;  STERNAL CLOSURE N/A 10/12/2021   Procedure: STERNAL CLOSURE;  Surgeon: Lucas Dorise POUR, MD;  [facility];  Service: Thoracic;  Laterality: N/A;   TEE WITHOUT CARDIOVERSION N/A 09/12/2021   Procedure: TRANSESOPHAGEAL ECHOCARDIOGRAM (TEE);  Surgeon: Lucas Dorise POUR, MD;  Location: [facility] OR;  Service: Open Heart Surgery;  Laterality: N/A;   TEE WITHOUT CARDIOVERSION N/A 09/29/2021   Procedure: TRANSESOPHAGEAL ECHOCARDIOGRAM (TEE);  Surgeon: Shyrl Linnie KIDD, MD;  Location: [facility] OR;  Service: Open Heart Surgery;  Laterality: N/A;   TEE WITHOUT CARDIOVERSION N/A 10/03/2021   Procedure: TRANSESOPHAGEAL ECHOCARDIOGRAM (TEE);  Surgeon: Obadiah Coy, MD;  Location: [facility] OR;  Service: Thoracic;  Laterality: N/A;   TEE WITHOUT CARDIOVERSION  10/07/2021   Procedure: TRANSESOPHAGEAL ECHOCARDIOGRAM (TEE);  Surgeon: Lucas Dorise POUR, MD;  Location: [facility]  OR;  Service: Open Heart Surgery;;   TEE WITHOUT CARDIOVERSION N/A 10/12/2021   Procedure: TRANSESOPHAGEAL ECHOCARDIOGRAM (TEE);  Surgeon: Lucas Dorise POUR, MD;  Location: [facility] OR;  Service: Thoracic;  Laterality: N/A;   TEE WITHOUT CARDIOVERSION N/A 10/17/2021   Procedure: TRANSESOPHAGEAL ECHOCARDIOGRAM (TEE);  Surgeon: Lucas Dorise POUR, MD;  Location: [facility] OR;  Service: Open Heart Surgery;  Laterality: N/A;   TESTICLE SURGERY  1970   pt states testicle was ascended and had to be pulled down   TONSILLECTOMY     as a child   TRANSESOPHAGEAL ECHOCARDIOGRAM (CATH LAB) N/A 08/17/2023   Procedure: TRANSESOPHAGEAL ECHOCARDIOGRAM;  Surgeon: Mona Vinie BROCKS, MD;  Location: [facility] INVASIVE CV LAB;  Service: Cardiovascular;  Laterality: N/A;   VENOUS ANGIOPLASTY  09/07/2023   Procedure: VENOUS ANGIOPLASTY;  Surgeon: Magda Debby SAILOR, MD;  Location: [facility];  Service: Cardiovascular;;  Basilic/Intragraft     reports that he quit smoking about 8 years ago. His smoking use included cigarettes. He has never used smokeless tobacco. He reports that he does not currently use alcohol . He reports that he does not currently use drugs.  No Known Allergies  History reviewed. No pertinent family history.  Prior to Admission medications   Medication Sig Start Date End Date Taking? Authorizing Provider  acetaminophen -codeine  (TYLENOL  #3) 300-30 MG tablet Take 1 tablet by mouth every 4 (four) to 6 (six) hours as needed FOR PAIN. 10/07/23     albuterol  (VENTOLIN  HFA) 108 (90 Base) MCG/ACT inhaler Inhale 1-2 puffs into the lungs every 4 (four) hours as needed for wheezing or shortness of breath. 10/02/23   Bowser, Ronnald BRAVO, NP  apixaban  (ELIQUIS ) 5 MG TABS tablet Take 1 tablet (5 mg total) by mouth 2 (two) times daily. 10/15/22   Lelon Hamilton T, PA-C  atorvastatin  (LIPITOR ) 80 MG tablet Take 1 tablet (80 mg total) by mouth daily. 10/15/22   Lelon Hamilton T, PA-C  Cinacalcet HCl (SENSIPAR PO) Take 60 mg by mouth. 07/28/23 07/26/24   [provider]  ibuprofen  (IBU) 800 MG tablet Take 1 tablet (800 mg total) by mouth every 6 (six) hours as needed FOR PAIN. 10/07/23     LOPERAMIDE  HCL PO Take 2 mg by mouth. 05/14/23 05/12/24  [provider]  Methoxy PEG-Epoetin Beta (MIRCERA IJ) Mircera 04/16/23 04/14/24  [provider]  multivitamin (RENA-VIT) TABS tablet Take 1 tablet by mouth. 12/03/21   [provider]  sevelamer  carbonate (RENVELA ) 800 MG tablet Take 1,600 mg by mouth.    [provider]    Physical Exam: Constitutional: Moderately built and nourished. Vitals:   10/13/23 2029 10/13/23 2330 10/14/23 0215 10/14/23 0229  BP:  127/71 124/67  Pulse:  68 73   Resp:  (!) 22 (!) 24   Temp:    97.8 F (36.6 C)  TempSrc:    Oral  SpO2:  100% 92%   Weight: 102.1 kg     Height: 5' 7 (1.702 m)      Eyes: Anicteric no pallor. ENMT: No discharge from the ears eyes nose or mouth. Neck: No mass felt.  No neck rigidity. Respiratory: No rhonchi or crepitations. Cardiovascular: S1-S2 heard. Abdomen: Soft nontender bowel sound present. Musculoskeletal: No edema. Skin: No rash. Neurologic: Alert awake oriented to time place and person.  Moves all extremities. Psychiatric: Appears normal.  Normal affect.   Labs on Admission: I have personally reviewed following labs and imaging studies  CBC: Recent Labs  Lab 10/13/23 2045  WBC 10.8*  HGB 10.6*  HCT 33.0*  MCV 92.4  PLT 213   Basic Metabolic Panel: Recent Labs  Lab 10/13/23 2045  NA 137  K 5.4*  CL 93*  CO2 24  GLUCOSE 117*  BUN 88*  CREATININE 10.21*  CALCIUM  8.1*   GFR: Estimated Creatinine Clearance: 8.4 mL/min (A) (by C-G formula based on SCr of 10.21 mg/dL (H)). Liver Function Tests: Recent Labs  Lab 10/13/23 2045  AST 13*  ALT 13  ALKPHOS 121  BILITOT 1.1  PROT 6.6  ALBUMIN  3.1*   No results for input(s): LIPASE, AMYLASE in the last 168 hours. No results for input(s): AMMONIA in the last 168  hours. Coagulation Profile: No results for input(s): INR, PROTIME in the last 168 hours. Cardiac Enzymes: No results for input(s): CKTOTAL, CKMB, CKMBINDEX, TROPONINI in the last 168 hours. BNP (last 3 results) No results for input(s): PROBNP in the last 8760 hours. HbA1C: No results for input(s): HGBA1C in the last 72 hours. CBG: Recent Labs  Lab 10/13/23 2035  GLUCAP 102*   Lipid Profile: No results for input(s): CHOL, HDL, LDLCALC, TRIG, CHOLHDL, LDLDIRECT in the last 72 hours. Thyroid  Function Tests: No results for input(s): TSH, T4TOTAL, FREET4, T3FREE, THYROIDAB in the last 72 hours. Anemia Panel: No results for input(s): VITAMINB12, FOLATE, FERRITIN, TIBC, IRON, RETICCTPCT in the last 72 hours. Urine analysis:    Component Value Date/Time   COLORURINE YELLOW 09/10/2021 1406   APPEARANCEUR HAZY (A) 09/10/2021 1406   LABSPEC 1.024 09/10/2021 1406   PHURINE 5.0 09/10/2021 1406   GLUCOSEU NEGATIVE 09/10/2021 1406   HGBUR NEGATIVE 09/10/2021 1406   BILIRUBINUR NEGATIVE 09/10/2021 1406   KETONESUR NEGATIVE 09/10/2021 1406   PROTEINUR >=300 (A) 09/10/2021 1406   NITRITE NEGATIVE 09/10/2021 1406   LEUKOCYTESUR NEGATIVE 09/10/2021 1406   Sepsis Labs: @LABRCNTIP (procalcitonin:4,lacticidven:4) ) Recent Results (from the past 240 hours)  Resp panel by RT-PCR (RSV, Flu A&B, Covid) Anterior Nasal Swab     Status: None   Collection Time: 10/13/23 11:35 PM   Specimen: Anterior Nasal Swab  Result Value Ref Range Status   SARS Coronavirus 2 by RT PCR NEGATIVE NEGATIVE Final   Influenza A by PCR NEGATIVE NEGATIVE Final   Influenza B by PCR NEGATIVE NEGATIVE Final    Comment: (NOTE) The Xpert Xpress SARS-CoV-2/FLU/RSV plus assay is intended as an aid in the diagnosis of influenza from Nasopharyngeal swab specimens and should not be used as a sole basis for treatment. Nasal washings and aspirates are unacceptable for Xpert Xpress  SARS-CoV-2/FLU/RSV testing.  Fact Sheet for Patients: BloggerCourse.com  Fact Sheet for Healthcare Providers: SeriousBroker.it  This test is not yet approved or cleared by the [place]   FDA and has been authorized for detection and/or diagnosis of SARS-CoV-2 by FDA under an Emergency Use Authorization (EUA). This EUA will remain in effect (meaning this test can be used) for the duration of the COVID-19 declaration under Section 564(b)(1) of the Act, 21 U.S.C. section 360bbb-3(b)(1), unless the authorization is terminated or revoked.     Resp Syncytial Virus by PCR NEGATIVE NEGATIVE Final    Comment: (NOTE) Fact Sheet for Patients: BloggerCourse.com  Fact Sheet for Healthcare Providers: SeriousBroker.it  This test is not yet approved or cleared by the [place]  FDA and has been authorized for detection and/or diagnosis of SARS-CoV-2 by FDA under an Emergency Use Authorization (EUA). This EUA will remain in effect (meaning this test can be used) for the duration of the COVID-19 declaration under Section 564(b)(1) of the Act, 21 U.S.C. section 360bbb-3(b)(1), unless the authorization is terminated or revoked.  Performed at [facility], [address] [place], [place] [address]      Radiological Exams on Admission: DG Chest Port 1 View Result Date: 10/13/2023 CLINICAL DATA:  Malaise. EXAM: PORTABLE CHEST 1 VIEW COMPARISON:  Chest x-ray 10/01/2023 FINDINGS: Sternotomy wires and mediastinal clips are again seen. The cardiomediastinal silhouette is within normal limits. There is a small left pleural effusion. Minimal atelectatic changes are seen in the left lung base. There central pulmonary vascular congestion. Pneumothorax or acute fracture. There surgical clips overlying the right shoulder. IMPRESSION: 1. Small left pleural effusion with minimal atelectatic changes in  the left lung base. 2. Central pulmonary vascular congestion. Electronically Signed   By: Greig Pique M.D.   On: 10/13/2023 23:29    EKG: Independently reviewed.  Normal sinus rhythm.  Assessment/Plan Principal Problem:   Fluid overload Active Problems:   Essential hypertension   Hyperlipidemia associated with type 2 diabetes mellitus (HCC)   Diabetes mellitus type 2 with neurological manifestations (HCC)   History of cerebral embolism with cerebral infarction   S/P CABG x 5   S/P pericardial window creation   Atrial flutter (HCC)   ESRD (end stage renal disease) on dialysis (HCC)   Paroxysmal atrial fibrillation (HCC)    Fluid overload with mild hyperkalemia after missing dialysis for which nephrology has been consulted.  Follow metabolic panel and respiratory status after dialysis. CAD status post CABG complicated with tamponade underwent pericardial window presently on Eliquis  statins. Atrial flutter status post recent ablation on Eliquis . Diabetes mellitus type 2 presently not on any antidiabetic medications.  Will closely monitor CBGs.  Hemoglobin A1c 6.2 on March 2025.  Anemia likely from renal disease follow CBC. 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  Patient has missed his dialysis with fluid overload and mild hyperkalemia will need further management and more than 2 midnight stay.   DVT prophylaxis: Heparin . Code Status: Full code. Family Communication: [place] with patient. Disposition Plan: Monitored bed. Consults called: Nephrology. Admission status: Observation.

## 2023-10-14 NOTE — TOC Transition Note (Signed)
 Transition of Care [facility]) - Discharge Note   Patient Details  Name: [name] MRN: [id number] Date of Birth: 03/15/1960  [facility] CM/SW Contact:  Lendia Dais, LCSWA Phone Number: 10/14/2023, 2:44 PM   Clinical Narrative:  CSW spoke to patient at bedside and introduced self and role.   Pt is from home alone, independent with ADL's, and is able to drive. Pt's source of income is disability, [employer], and medicaid. Pt's plan is to return home upon discharge.  No ICM needs at this time.      Barriers to Discharge: Continued Medical Work up   Patient Goals and CMS Choice Patient states their goals for this hospitalization and ongoing recovery are:: Return Home   Choice offered to / list presented to : NA      Discharge Placement                       Discharge Plan and Services Additional resources added to the After Visit Summary for   In-house Referral: Clinical Social Work                                   Social Drivers of Health (SDOH) Interventions SDOH Screenings   Food Insecurity: No Food Insecurity (04/27/2023)  Housing: Low Risk  (04/27/2023)  Transportation Needs: No Transportation Needs (04/27/2023)  Utilities: Not At Risk (12/06/2021)  Alcohol  Screen: Low Risk  (04/29/2023)  Depression (PHQ2-9): Low Risk  (04/29/2023)  Financial Resource Strain: Low Risk  (04/27/2023)  Physical Activity: Unknown (04/27/2023)  Social Connections: Socially Isolated (04/27/2023)  Stress: No Stress Concern Present (04/27/2023)  Tobacco Use: Medium Risk (10/14/2023)  Health Literacy: Adequate Health Literacy (04/29/2023)     Readmission Risk Interventions    09/20/2021   11:57 AM  Readmission Risk Prevention Plan  Post Dischage Appt Complete  Medication Screening Complete  Transportation Screening Complete

## 2023-10-14 NOTE — Progress Notes (Addendum)
 Pt receives out-pt HD at [facility] on TTS 11:50 am chair time. Will assist as needed.   [name] Dialysis Navigator [phone]  Addendum 3:54 D/c orders noted, contacted out-pt clinic [facility] to anticipate arrival tomorrow. No further support needed.

## 2023-10-14 NOTE — Consult Note (Signed)
 Renal Service Consult Note [facility] Mark JONETTA Fret, MD  Patient: [name] Date: 10/14/2023 Requesting Physician: Dr. Mcarthur  Reason for Consult: ESRD pt w/ dizziness, malaise HPI: The patient is a 63 y.o. year-old w/ PMH as below who presented to ED last night c/o dizziness and malaise. Missed HD yesterday (TTS) due to these symptoms. Also then had some vomiting yesterday. Pt admit in August for atrial flutter ablation. Also has hx of prior CABG. In 2023 had cardiac tamponade c/b PEA arrest and required ECMO (09/2021). In ED pt was stable but required 2L O2. CXR showed pleural effusion. Nephrology night coverage was called for dialysis. We are asked to see for ESRD.    Pt seen in HD unit. Pt is sleeping and doesn't want to be bothered. No hx obtained.    ROS - n/a   Past Medical History  Past Medical History:  Diagnosis Date   AKI (acute kidney injury) (HCC)    pt unaware of this   Anginal pain (HCC)    CAD (coronary artery disease)    a. 03/2015 NSTEMI: LHC with severe 3V CAD  (70% mid RCA, 95% OM1, 90% distal LCx, 90% OM3, 80% prox LAD and 90% ost D1) s/p DES to mLAD w/ small dissction Rx with DES, staged ost Ramus PCI/DES and dLCx s/p PCI/DES    Chest pain 12/24/2020   Diabetes mellitus type 2 in obese    Diverticulosis    Dyspnea    Dyspnea on exertion 03/16/2015   Dyspnea on exertion   ESRD (end stage renal disease) (HCC)    Family history of adverse reaction to anesthesia    patient father- pt states after anesthesia his father developed dementia   GERD (gastroesophageal reflux disease)    Hypercholesteremia    Hypertension associated with diabetes (HCC) 03/16/2015   hypertension   NSTEMI (non-ST elevated myocardial infarction) (HCC) 03/17/2015   Obesity    Personal history of ECMO 2023   Stroke (HCC) 2022   pt states he had a mini stroke during cardiac catheterization   Tobacco abuse    TTHS- [address]    Past Surgical History  Past  Surgical History:  Procedure Laterality Date   A-FLUTTER ABLATION N/A 10/01/2023   Procedure: A-FLUTTER ABLATION;  Surgeon: Mealor, Eulas BRAVO, MD;  Location: [facility] INVASIVE CV LAB;  Service: Cardiovascular;  Laterality: N/A;   A/V SHUNT INTERVENTION Right 09/07/2023   Procedure: A/V SHUNT INTERVENTION;  Surgeon: Magda Debby SAILOR, MD;  Location: [facility] PV LAB;  Service: Cardiovascular;  Laterality: Right;   APPLICATION OF WOUND VAC N/A 10/03/2021   Procedure: APPLICATION OF WOUND VAC;  Surgeon: Obadiah Coy, MD;  Location: [facility] OR;  Service: Thoracic;  Laterality: N/A;   AV FISTULA PLACEMENT Right 03/14/2022   Procedure: RIGHT ARM ARTERIOVENOUS (AV) FISTULA VERSUS ARTERIOVENOUS GRAFT CREATION;  Surgeon: Serene Gaile ORN, MD;  Location: [facility] OR;  Service: Vascular;  Laterality: Right;   BASCILIC VEIN TRANSPOSITION Right 09/26/2022   Procedure: RIGHT ARM SECOND STAGE BASILIC VEIN TRANSPOSITION;  Surgeon: Serene Gaile ORN, MD;  Location: [facility] OR;  Service: Vascular;  Laterality: Right;   CANNULATION FOR ECMO (EXTRACORPOREAL MEMBRANE OXYGENATION) N/A 09/29/2021   Procedure: CANNULATION FOR ECMO (EXTRACORPOREAL MEMBRANE OXYGENATION);  Surgeon: Shyrl Linnie KIDD, MD;  Location: [facility] OR;  Service: Open Heart Surgery;  Laterality: N/A;   CARDIAC CATHETERIZATION N/A 03/19/2015   Procedure: Left Heart Cath and Coronary Angiography;  Surgeon: Dorn JINNY Lesches, MD;  Location: [facility] INVASIVE CV  LAB;  Service: Cardiovascular;  Laterality: N/A;   CARDIAC CATHETERIZATION N/A 03/20/2015   Procedure: Coronary Stent Intervention;  Surgeon: Dorn JINNY Lesches, MD;  [facility]: [facility];  Service: Cardiovascular;  Laterality: N/A;   CARDIAC CATHETERIZATION N/A 03/22/2015   Procedure: Coronary Stent Intervention;  Surgeon: Dorn JINNY Lesches, MD;  [facility]: [facility];  Service: Cardiovascular;  Laterality: N/A;   CARDIOVERSION N/A 08/17/2023   Procedure: CARDIOVERSION;  Surgeon: Mona Vinie BROCKS, MD;  [facility]: [facility]  [facility];  Service: Cardiovascular;  Laterality: N/A;   CORONARY ARTERY BYPASS GRAFT N/A 09/12/2021   Procedure: CORONARY ARTERY BYPASS GRAFTING (CABG) X 5 USING LEFT INTERNAL MAMMARY ARTERY AND ENDOSCOPICALLY HARVESTED RIGHT GREATER SAPHENOUS VEIN.;  Surgeon: Lucas Dorise POUR, MD;  Location: [facility] OR;  Service: Open Heart Surgery;  Laterality: N/A;   EXPLORATION POST OPERATIVE OPEN HEART N/A 09/30/2021   Procedure: EXPLORATION POST OPERATIVE OPEN HEART WASHOUT;  Surgeon: Shyrl Linnie KIDD, MD;  Location: [facility];  Service: Open Heart Surgery;  Laterality: N/A;   IR FLUORO GUIDE CV LINE LEFT  11/04/2021   IR FLUORO GUIDE CV LINE LEFT  12/06/2021   IR FLUORO GUIDE CV LINE LEFT  04/11/2022   IR GASTROSTOMY TUBE MOD SED  11/15/2021   IR GASTROSTOMY TUBE MOD SED  11/20/2021   IR GASTROSTOMY TUBE REMOVAL  01/20/2022   IR US  GUIDE VASC ACCESS LEFT  11/04/2021   IR VENOCAVAGRAM SVC  12/06/2021   IR VENOCAVAGRAM SVC  04/11/2022   LEFT HEART CATH AND CORONARY ANGIOGRAPHY N/A 12/25/2020   Procedure: LEFT HEART CATH AND CORONARY ANGIOGRAPHY;  Surgeon: Burnard Debby LABOR, MD;  [facility];  Service: Cardiovascular;  Laterality: N/A;   LEFT HEART CATH AND CORONARY ANGIOGRAPHY N/A 12/26/2020   Procedure: LEFT HEART CATH AND CORONARY ANGIOGRAPHY;  Surgeon: Burnard Debby LABOR, MD;  [facility]: [facility];  Service: Cardiovascular;  Laterality: N/A;   MEDIASTINAL EXPLORATION N/A 10/03/2021   Procedure: MEDIASTINAL WASHOUT;  Surgeon: Obadiah Coy, MD;  Location: [facility];  Service: Thoracic;  Laterality: N/A;  PUMP STANDBY   MEDIASTINAL EXPLORATION N/A 10/12/2021   Procedure: MEDIASTINAL WASHOUT;  Surgeon: Lucas Dorise POUR, MD;  Location: [facility];  Service: Thoracic;  Laterality: N/A;   PERCUTANEOUS TRACHEOSTOMY N/A 10/17/2021   Procedure: PERCUTANEOUS TRACHEOSTOMY USING SHILEY FLEXIBLE 8 mm CUFFED TRACH.;  Surgeon: Claudene Toribio BROCKS, MD;  Location: [facility] OR;  Service: Pulmonary;  Laterality: N/A;  Percutaneous  tracheostomy   PERICARDIAL WINDOW Right 09/28/2021   Procedure: PERICARDIAL WINDOW;  Surgeon: Shyrl Linnie KIDD, MD;  [facility]: [facility];  Service: Thoracic;  Laterality: Right;  Right VATS.  Lazy lateral.  double lumen ET tube   PLACEMENT OF IMPELLA LEFT VENTRICULAR ASSIST DEVICE N/A 10/07/2021   Procedure: INSERTION OF RIGHT AXILLARY IMPELLA, DECANNULATION OF ECMO, AND MEDIASTINAL WASHOUT;  Surgeon: Lucas Dorise POUR, MD;  Location: [facility];  Service: Open Heart Surgery;  Laterality: N/A;  Open right axillary with 8 mm Hemashield Platinum Vascular graft.   RADIOLOGY WITH ANESTHESIA N/A 11/20/2021   Procedure: G-Tube Placement;  Surgeon: Adele Rush, MD;  Location: [facility] OR;  Service: Radiology;  Laterality: N/A;   REMOVAL OF IMPELLA LEFT VENTRICULAR ASSIST DEVICE Right 10/17/2021   Procedure: REMOVAL OF IMPELLA LEFT VENTRICULAR ASSIST DEVICE;  Surgeon: Lucas Dorise POUR, MD;  [facility]: [facility];  Service: Open Heart Surgery;  Laterality: Right;   STERNAL CLOSURE N/A 10/12/2021   Procedure: STERNAL CLOSURE;  Surgeon: Lucas Dorise  K, MD;  Location: [facility] OR;  Service: Thoracic;  Laterality: N/A;   TEE WITHOUT CARDIOVERSION N/A 09/12/2021   Procedure: TRANSESOPHAGEAL ECHOCARDIOGRAM (TEE);  Surgeon: Lucas Dorise POUR, MD;  Location: [facility] OR;  Service: Open Heart Surgery;  Laterality: N/A;   TEE WITHOUT CARDIOVERSION N/A 09/29/2021   Procedure: TRANSESOPHAGEAL ECHOCARDIOGRAM (TEE);  Surgeon: Shyrl Linnie KIDD, MD;  Location: [facility] OR;  Service: Open Heart Surgery;  Laterality: N/A;   TEE WITHOUT CARDIOVERSION N/A 10/03/2021   Procedure: TRANSESOPHAGEAL ECHOCARDIOGRAM (TEE);  Surgeon: Obadiah Coy, MD;  Location: [facility] OR;  Service: Thoracic;  Laterality: N/A;   TEE WITHOUT CARDIOVERSION  10/07/2021   Procedure: TRANSESOPHAGEAL ECHOCARDIOGRAM (TEE);  Surgeon: Lucas Dorise POUR, MD;  Location: [facility] OR;  Service: Open Heart Surgery;;   TEE WITHOUT CARDIOVERSION N/A 10/12/2021   Procedure: TRANSESOPHAGEAL ECHOCARDIOGRAM (TEE);  Surgeon:  Lucas Dorise POUR, MD;  Location: [facility] OR;  Service: Thoracic;  Laterality: N/A;   TEE WITHOUT CARDIOVERSION N/A 10/17/2021   Procedure: TRANSESOPHAGEAL ECHOCARDIOGRAM (TEE);  Surgeon: Lucas Dorise POUR, MD;  Location: [facility] OR;  Service: Open Heart Surgery;  Laterality: N/A;   TESTICLE SURGERY  1970   pt states testicle was ascended and had to be pulled down   TONSILLECTOMY     as a child   TRANSESOPHAGEAL ECHOCARDIOGRAM (CATH LAB) N/A 08/17/2023   Procedure: TRANSESOPHAGEAL ECHOCARDIOGRAM;  Surgeon: Mona Vinie BROCKS, MD;  Location: [facility] INVASIVE CV LAB;  Service: Cardiovascular;  Laterality: N/A;   VENOUS ANGIOPLASTY  09/07/2023   Procedure: VENOUS ANGIOPLASTY;  Surgeon: Magda Debby SAILOR, MD;  Location: [facility];  Service: Cardiovascular;;  Basilic/Intragraft   Family History History reviewed. No pertinent family history. Social History  reports that he quit smoking about 8 years ago. His smoking use included cigarettes. He has never used smokeless tobacco. He reports that he does not currently use alcohol . He reports that he does not currently use drugs. Allergies No Known Allergies Home medications Prior to Admission medications   Medication Sig Start Date End Date Taking? Authorizing Provider  acetaminophen -codeine  (TYLENOL  #3) 300-30 MG tablet Take 1 tablet by mouth every 4 (four) to 6 (six) hours as needed FOR PAIN. 10/07/23     albuterol  (VENTOLIN  HFA) 108 (90 Base) MCG/ACT inhaler Inhale 1-2 puffs into the lungs every 4 (four) hours as needed for wheezing or shortness of breath. 10/02/23   Bowser, Ronnald BRAVO, NP  apixaban  (ELIQUIS ) 5 MG TABS tablet Take 1 tablet (5 mg total) by mouth 2 (two) times daily. 10/15/22   Lelon Hamilton T, PA-C  atorvastatin  (LIPITOR ) 80 MG tablet Take 1 tablet (80 mg total) by mouth daily. 10/15/22   Lelon Hamilton T, PA-C  Cinacalcet HCl (SENSIPAR PO) Take 60 mg by mouth. 07/28/23 07/26/24  [provider]  ibuprofen  (IBU) 800 MG tablet Take 1 tablet (800 mg total) by  mouth every 6 (six) hours as needed FOR PAIN. 10/07/23     LOPERAMIDE  HCL PO Take 2 mg by mouth. 05/14/23 05/12/24  [provider]  Methoxy PEG-Epoetin Beta (MIRCERA IJ) Mircera 04/16/23 04/14/24  [provider]  multivitamin (RENA-VIT) TABS tablet Take 1 tablet by mouth. 12/03/21   [provider]  sevelamer  carbonate (RENVELA ) 800 MG tablet Take 1,600 mg by mouth.    [provider]     Vitals:   10/14/23 1200 10/14/23 1211 10/14/23 1215 10/14/23 1335  BP: 121/63 (!) 124/59 120/64 125/69  Pulse: 86 86 88 87  Resp: 18 20 20 18   Temp:  97.9 F (36.6 C) 97.7 F (36.5 C)  TempSrc:    Oral  SpO2: 100% 100% 96% 100%  Weight:      Height:       Exam Gen alert, no distress, [place] O2 Sclera anicteric, throat clear  No jvd or bruits Chest clear bilat to bases RRR no MRG Abd soft ntnd no mass or ascites +bs Ext 1+ bilat PT edema, no other edema Neuro is alert, Ox 3 , nf    RUA AVF+bruit   Home bp meds: None    OP HD: [facility] TTS 4h  B400  97.7kg   2K bath   AVF   Heparin  none Last OP HD 8/30, post wt 99.8kg Comes off 1-5kg over, rarely gets to dry wt Hb 10.5- 12, no esa   Assessment/ Plan: SOB: suspected vol overload after missed HD yesterday. CXR +vasc congestion. Rarely gets to dry wt from OP records. Plan is for HD this morning, max UF as tolerated.  Dizziness: per pmd ESRD: on HD TTS. HD today off schedule. F/u in am.  BP: bp's are not high, not on any bp lowering meds Volume: mild vol overload on exam this am early into HD Hyperkalemia: mild K+ 5.4 Dispo: if has good session this am and we get sig UF, pt should be okay for dc this afternoon. He should be prepared to go to his OP dialysis session tomorrow, also, as scheduled.        Myer Fret  MD [facility] 10/14/2023, 1:48 PM  Recent Labs  Lab 10/13/23 2045  HGB 10.6*  ALBUMIN  3.1*  CALCIUM  8.1*  CREATININE 10.21*  K 5.4*   Inpatient medications:  apixaban   5 mg Oral BID   atorvastatin    80 mg Oral Daily   Chlorhexidine  Gluconate Cloth  6 each Topical Q0600   multivitamin  1 tablet Oral QHS   sevelamer  carbonate  1,600 mg Oral TID WC    acetaminophen  **OR** acetaminophen , albuterol 

## 2023-10-14 NOTE — Progress Notes (Signed)
   10/14/23 1215  Vitals  Temp 97.9 F (36.6 C)  Pulse Rate 88  Resp 20  BP 120/64  SpO2 96 %  Weight  (stretcher)  Post Treatment  Dialyzer Clearance Lightly streaked  Hemodialysis Intake (mL) 0 mL  Liters Processed 83.9  Fluid Removed (mL) 4500 mL  Tolerated HD Treatment Yes  AVG/AVF Arterial Site Held (minutes) 10 minutes  AVG/AVF Venous Site Held (minutes) 10 minutes   Received patient in bed to unit.  Alert and oriented.  Informed consent signed and in chart.   TX duration:3.5hrs  Patient tolerated well.  Transported back to the room  Alert, without acute distress.  Hand-off given to patient's nurse.   Access used: RAVF Access issues: none  Total UF removed: 4.5L Medication(s) given: none   Na[facility]aminy T [facility]

## 2023-10-14 NOTE — Progress Notes (Signed)
 Patient admit from ER following HD with 4.5L fluid removal. Patient states he is leaving. Clarified and patient refuses telemetry, assessment, and medications and states if not discharged he will be leaving in the hour notified MD and orders received for discharge.

## 2023-10-14 NOTE — Discharge Summary (Signed)
 Physician Discharge Summary   Patient: [name] MRN: [id number] DOB: 04/03/1960  Admit date:     10/13/2023  Discharge date: 10/14/23  Discharge Physician: Derryl Duval   PCP: Tanda Bleacher, MD   Recommendations at discharge:   Hemodialysis tomorrow   Discharge Diagnoses: Principal Problem:   Fluid overload Active Problems:   Essential hypertension   Hyperlipidemia associated with type 2 diabetes mellitus (HCC)   Diabetes mellitus type 2 with neurological manifestations (HCC)   History of cerebral embolism with cerebral infarction   S/P CABG x 5   S/P pericardial window creation   Atrial flutter (HCC)   ESRD (end stage renal disease) on dialysis (HCC)   Paroxysmal atrial fibrillation (HCC)  Resolved Problems:   * No resolved hospital problems. *  Hospital Course:  [name] is a 63 y.o. male with history of ESRD on hemodialysis with a recent ablation for atrial flutter  prior history of CAD status post CABG complicated with cardiac tamponade and required pericardial window presented to the ER with complaints of feeling not well dizzy and had 1 episode of nausea vomiting.  The above symptoms made patient to sleep more and missed his dialysis.  Chest x-ray revealed patient, patient was hypoxic in the ER requiring 2 L/min supplemental oxygen .  Laboratory work revealed mild hyperkalemia.  Patient was admitted for fluid overload in the setting of missed dialysis.  Patient underwent dialysis with removal of 4.5 L of fluid.  He felt better after the run of HD.  He was able to ambulate well and was discharged to home.  He will is recommended to have dialysis tomorrow and continue Tuesday Thursday Saturday schedule.  Discharged in stable condition.         Consultants: nephro Procedures performed:   Disposition: Home Diet recommendation:  Discharge Diet Orders (From admission, onward)     Start     Ordered   10/14/23 0000  Diet - low sodium heart healthy         10/14/23 1543           Cardiac diet DISCHARGE MEDICATION: Allergies as of 10/14/2023   No Known Allergies      Medication List     TAKE these medications    acetaminophen -codeine  300-30 MG tablet Commonly known as: TYLENOL  #3 Take 1 tablet by mouth every 4 (four) to 6 (six) hours as needed FOR PAIN.   albuterol  108 (90 Base) MCG/ACT inhaler Commonly known as: VENTOLIN  HFA Inhale 1-2 puffs into the lungs every 4 (four) hours as needed for wheezing or shortness of breath.   atorvastatin  80 MG tablet Commonly known as: LIPITOR  Take 1 tablet (80 mg total) by mouth daily.   Eliquis  5 MG Tabs tablet Generic drug: apixaban  Take 1 tablet (5 mg total) by mouth 2 (two) times daily.   ibuprofen  800 MG tablet Commonly known as: IBU Take 1 tablet (800 mg total) by mouth every 6 (six) hours as needed FOR PAIN.   LOPERAMIDE  HCL PO Take 2 mg by mouth.   MIRCERA IJ Mircera   multivitamin Tabs tablet Take 1 tablet by mouth.   SENSIPAR PO Take 60 mg by mouth.   sevelamer  carbonate 800 MG tablet Commonly known as: RENVELA  Take 2 tablets (1,600 mg total) by mouth 3 (three) times daily with meals. What changed: when to take this        Follow-up Information     Tanda Bleacher, MD Follow up in 1 week(s).  Specialty: Family Medicine Contact information: [address] [place] [address] [phone]                Discharge Exam: Fredricka Weights   10/13/23 2029  Weight: 102.1 kg   General, alert oriented not in any acute distress Chest: Mild crackles bilaterally, no wheezing,old surgical scars on chest and abdomen CVS: S1, S2, murmurs/, regular rhythm Abdomen: Soft, nontender Extremities: Bilateral pitting edema  Condition at discharge: good  The results of significant diagnostics from this hospitalization (including imaging, microbiology, ancillary and laboratory) are listed below for reference.   Imaging Studies: DG Chest Port 1  View Result Date: 10/13/2023 CLINICAL DATA:  Malaise. EXAM: PORTABLE CHEST 1 VIEW COMPARISON:  Chest x-ray 10/01/2023 FINDINGS: Sternotomy wires and mediastinal clips are again seen. The cardiomediastinal silhouette is within normal limits. There is a small left pleural effusion. Minimal atelectatic changes are seen in the left lung base. There central pulmonary vascular congestion. Pneumothorax or acute fracture. There surgical clips overlying the right shoulder. IMPRESSION: 1. Small left pleural effusion with minimal atelectatic changes in the left lung base. 2. Central pulmonary vascular congestion. Electronically Signed   By: Greig Pique M.D.   On: 10/13/2023 23:29   DG CHEST PORT 1 VIEW Result Date: 10/01/2023 CLINICAL DATA:  Acute hypoxic respiratory failure. EXAM: PORTABLE CHEST 1 VIEW COMPARISON:  10/11/2022 FINDINGS: Prior median sternotomy. Cardiomegaly is stable. Bilateral pleural effusions and ill-defined basilar opacities. Vascular congestion. No pneumothorax. IMPRESSION: 1. Cardiomegaly with vascular congestion. 2. Bilateral pleural effusions and ill-defined basilar opacities, likely atelectasis. Electronically Signed   By: Andrea Gasman M.D.   On: 10/01/2023 15:38   EP STUDY Result Date: 10/01/2023  SURGEON:  Eulas FORBES Furbish, MD    PREPROCEDURE DIAGNOSIS:  Atrial flutter.    POSTPROCEDURE DIAGNOSIS:  Isthmus-dependent counter clockwise right atrial flutter.    PROCEDURES: Comprehensive EP study Coronary sinus pacing and recording 3D Mapping of supraventricular tachycardia Radiofrequency ablation of supraventricular tachycardia -- CTI for atrial flutter    INTRODUCTION: [name] is a 63 y.o. male with a history of typical appearing atrial flutter who presents today for EP study and radiofrequency ablation.  He has had progressive heart failure with flutter and RVR resulting in hypotension and difficulty pulling fluid in dialysis. I scheduled this procedure urgently as he has failed DC  cardioversion and has been progressively worsening with CHF. The patient has been adequately anticoagulated for three weeks and now presents for EP study and radiofrequency ablation of atrial flutter.   DESCRIPTION OF PROCEDURE:  Informed written consent was obtained and the patient was brought to the Electrophysiology Lab in the fasting state. The patient was adequately sedated with intravenous medication as outlined in the anesthesia report.  The patient's bilateral groins were prepped and draped in the usual sterile fashion by the EP Lab staff.  Using ultrasound guidance and modified Seldinger technique, one 9-French sheath were placed in the right groin and a 9 Fr and a 7 Fr sheath placed in the right groin. An 8-French intracardiac echo catheter was advanced through the 9-French sheath to the right atrium. Baseline intracardiac echo exam revealed trivial pericardial effusion and normal left ventricular function. The CTI was suitable for ablation. A 7-French Biosense Webster decapolar catheter was introduced through the right common femoral vein and advanced into the coronary sinus for recording and pacing from this location.  A Biosense Webster 8mm irrigated ablation catheter was then advanced to the right atrium. A limited  electroanatomic map was created of the relevant structures and the His annotated. Presenting Measurements: The patient presented to the Electrophysiology Lab in atrial flutter.  The surface electrocardiogram was consistent with typical atrial flutter.  The atrial flutter cycle length was 270 milliseconds.  The coronary sinus catheter activation revealed proximal to distal activation and was therefore suggestive of right atrial flutter.  The patient's QRS duration was 132 milliseconds with a QT interval of 446 milliseconds and an HV interval of 62 milliseconds.  Mapping: We performed an electroanatomic map of the right atrium and captured greater than 90% of the cycle length in the RA.  The  activation map demonstrated counterclockwise reentry around the tricuspid valve annulus. The patient was therefore felt to have isthmus-dependent right atrial flutter.    I therefore elected to perform cavotricuspid isthmus ablation today. Ablation: The ablation catheter was positioned along the cavotricuspid isthmus and a series of radiofrequency applications were delivered with up to 60 Watts.  The tachycardia slowed and then terminated during radiofrequency application. Additional radiofrequency lesions were delivered to consolidate the ablated line along the isthmus.  Bidirectional block was demonstrated across the CTI by pacing and recording from the proximal CS electrodes and the ablation catheter positioned lateral to the ablated line. Differential pacing also confirmed block across the CTI. The stimulus to earliest activation across the isthmus bidirectionally measured > 190 msec.  I observed the patient for approximately 10 minutes before opting to conclude the case. I  felt that, due to his tenuous respiratory status, it would be best to limit anesthesia time as much as possible. Measurements following ablation: Following ablation, the AH interval measured 68 milliseconds with an HV interval of 62 milliseconds.  Atrial pacing was performed, which revealed decremental AV conduction with no evidence of PR greater than RR.  The AV Wenckebach cycle length was 400 milliseconds.  Atrial pacing was continued down to a cycle length of 200 milliseconds with no arrhythmias induced.  No arrhythmias were induced. The procedure was therefore considered completed.  All catheters were removed and the sheaths were aspirated and flushed.  The sheaths were removed and hemostasis achieved with manual pressure. EBL<38ml.  The patient tolerated the procedure well without immediate complication.    CONCLUSIONS:  1. Isthmus-dependent counter clockwise right atrial flutter.  2. Successful radiofrequency ablation of atrial flutter  along the cavotricuspid isthmus with complete bidirectional isthmus block achieved.  3. No inducible arrhythmias following ablation.  4. No early apparent complications.   Eulas FORBES Furbish, MD 10/01/2023 10:16 AM    Microbiology: Results for orders placed or performed during the hospital encounter of 10/13/23  Resp panel by RT-PCR (RSV, Flu A&B, Covid) Anterior Nasal Swab     Status: None   Collection Time: 10/13/23 11:35 PM   Specimen: Anterior Nasal Swab  Result Value Ref Range Status   SARS Coronavirus 2 by RT PCR NEGATIVE NEGATIVE Final   Influenza A by PCR NEGATIVE NEGATIVE Final   Influenza B by PCR NEGATIVE NEGATIVE Final    Comment: (NOTE) The Xpert Xpress SARS-CoV-2/FLU/RSV plus assay is intended as an aid in the diagnosis of influenza from Nasopharyngeal swab specimens and should not be used as a sole basis for treatment. Nasal washings and aspirates are unacceptable for Xpert Xpress SARS-CoV-2/FLU/RSV testing.  Fact Sheet for Patients: BloggerCourse.com  Fact Sheet for Healthcare Providers: SeriousBroker.it  This test is not yet approved or cleared by the [place]  FDA and has been authorized for detection and/or diagnosis of SARS-CoV-2  by FDA under an Emergency Use Authorization (EUA). This EUA will remain in effect (meaning this test can be used) for the duration of the COVID-19 declaration under Section 564(b)(1) of the Act, 21 U.S.C. section 360bbb-3(b)(1), unless the authorization is terminated or revoked.     Resp Syncytial Virus by PCR NEGATIVE NEGATIVE Final    Comment: (NOTE) Fact Sheet for Patients: BloggerCourse.com  Fact Sheet for Healthcare Providers: SeriousBroker.it  This test is not yet approved or cleared by the [place]  FDA and has been authorized for detection and/or diagnosis of SARS-CoV-2 by FDA under an Emergency Use Authorization  (EUA). This EUA will remain in effect (meaning this test can be used) for the duration of the COVID-19 declaration under Section 564(b)(1) of the Act, 21 U.S.C. section 360bbb-3(b)(1), unless the authorization is terminated or revoked.  Performed at [facility], [address], [place], [place] [address]     Labs: CBC: Recent Labs  Lab 10/13/23 2045  WBC 10.8*  HGB 10.6*  HCT 33.0*  MCV 92.4  PLT 213   Basic Metabolic Panel: Recent Labs  Lab 10/13/23 2045  NA 137  K 5.4*  CL 93*  CO2 24  GLUCOSE 117*  BUN 88*  CREATININE 10.21*  CALCIUM  8.1*   Liver Function Tests: Recent Labs  Lab 10/13/23 2045  AST 13*  ALT 13  ALKPHOS 121  BILITOT 1.1  PROT 6.6  ALBUMIN  3.1*   CBG: Recent Labs  Lab 10/13/23 2035 10/14/23 0712  GLUCAP 102* 70    Discharge time spent: less than 30 minutes.  Signed: Derryl Duval, MD [facility] 10/14/2023

## 2023-10-15 ENCOUNTER — Telehealth: Payer: Self-pay

## 2023-10-15 ENCOUNTER — Telehealth (HOSPITAL_COMMUNITY): Payer: Self-pay | Admitting: Nephrology

## 2023-10-15 DIAGNOSIS — N2581 Secondary hyperparathyroidism of renal origin: Secondary | ICD-10-CM | POA: Diagnosis not present

## 2023-10-15 DIAGNOSIS — E876 Hypokalemia: Secondary | ICD-10-CM | POA: Diagnosis not present

## 2023-10-15 DIAGNOSIS — N186 End stage renal disease: Secondary | ICD-10-CM | POA: Diagnosis not present

## 2023-10-15 DIAGNOSIS — D631 Anemia in chronic kidney disease: Secondary | ICD-10-CM | POA: Diagnosis not present

## 2023-10-15 DIAGNOSIS — Z992 Dependence on renal dialysis: Secondary | ICD-10-CM | POA: Diagnosis not present

## 2023-10-15 NOTE — Telephone Encounter (Signed)
 Transition of care contact from inpatient facility  Date of Discharge: 10/14/2023  Date of Contact: 10/15/2023 - attempt #1  Method of contact: Phone  Attempted to contact patient to discuss transition of care from inpatient admission. Patient did not answer the phone. Message was left on the patient's voicemail with call back number [phone].  Izetta Boehringer, PA-C [employer] Pager [phone]

## 2023-10-15 NOTE — Transitions of Care (Post Inpatient/ED Visit) (Signed)
   10/15/2023  Name: [name] MRN: [id number] DOB: May 02, 1960  Today's TOC FU Call Status: Today's TOC FU Call Status:: Unsuccessful Call (1st Attempt) Unsuccessful Call (1st Attempt) Date: 10/15/23  Attempted to reach the patient regarding the most recent Inpatient/ED visit.  Follow Up Plan: Additional outreach attempts will be made to reach the patient to complete the Transitions of Care (Post Inpatient/ED visit) call.   Signature  Julian Lemmings, LPN [facility] Nurse Health Advisor Direct Dial [phone]

## 2023-10-16 NOTE — Telephone Encounter (Signed)
 Patient has been seen.

## 2023-10-16 NOTE — Transitions of Care (Post Inpatient/ED Visit) (Signed)
   10/16/2023  Name: [name] MRN: [id number] DOB: Oct 20, 1960  Today's TOC FU Call Status: Today's TOC FU Call Status:: Unsuccessful Call (2nd Attempt) Unsuccessful Call (1st Attempt) Date: 10/15/23 Unsuccessful Call (2nd Attempt) Date: 10/16/23  Attempted to reach the patient regarding the most recent Inpatient/ED visit.  Follow Up Plan: Additional outreach attempts will be made to reach the patient to complete the Transitions of Care (Post Inpatient/ED visit) call.   Signature Julian Lemmings, LPN [facility] Nurse Health Advisor Direct Dial [phone]

## 2023-10-17 DIAGNOSIS — D631 Anemia in chronic kidney disease: Secondary | ICD-10-CM | POA: Diagnosis not present

## 2023-10-17 DIAGNOSIS — E876 Hypokalemia: Secondary | ICD-10-CM | POA: Diagnosis not present

## 2023-10-17 DIAGNOSIS — N186 End stage renal disease: Secondary | ICD-10-CM | POA: Diagnosis not present

## 2023-10-17 DIAGNOSIS — N2581 Secondary hyperparathyroidism of renal origin: Secondary | ICD-10-CM | POA: Diagnosis not present

## 2023-10-17 DIAGNOSIS — Z992 Dependence on renal dialysis: Secondary | ICD-10-CM | POA: Diagnosis not present

## 2023-10-19 NOTE — Transitions of Care (Post Inpatient/ED Visit) (Signed)
   10/19/2023  Name: [name] MRN: [id number] DOB: 1960/05/10  Today's TOC FU Call Status: Today's TOC FU Call Status:: Unsuccessful Call (3rd Attempt) Unsuccessful Call (1st Attempt) Date: 10/15/23 Unsuccessful Call (2nd Attempt) Date: 10/16/23 Unsuccessful Call (3rd Attempt) Date: 10/19/23  Attempted to reach the patient regarding the most recent Inpatient/ED visit.  Follow Up Plan: No further outreach attempts will be made at this time. We have been unable to contact the patient.  Signature Julian Lemmings, LPN [facility] Nurse Health Advisor Direct Dial [phone]

## 2023-10-20 DIAGNOSIS — D631 Anemia in chronic kidney disease: Secondary | ICD-10-CM | POA: Diagnosis not present

## 2023-10-20 DIAGNOSIS — N186 End stage renal disease: Secondary | ICD-10-CM | POA: Diagnosis not present

## 2023-10-20 DIAGNOSIS — Z992 Dependence on renal dialysis: Secondary | ICD-10-CM | POA: Diagnosis not present

## 2023-10-20 DIAGNOSIS — E876 Hypokalemia: Secondary | ICD-10-CM | POA: Diagnosis not present

## 2023-10-20 DIAGNOSIS — N2581 Secondary hyperparathyroidism of renal origin: Secondary | ICD-10-CM | POA: Diagnosis not present

## 2023-10-22 DIAGNOSIS — N186 End stage renal disease: Secondary | ICD-10-CM | POA: Diagnosis not present

## 2023-10-22 DIAGNOSIS — E876 Hypokalemia: Secondary | ICD-10-CM | POA: Diagnosis not present

## 2023-10-22 DIAGNOSIS — N2581 Secondary hyperparathyroidism of renal origin: Secondary | ICD-10-CM | POA: Diagnosis not present

## 2023-10-22 DIAGNOSIS — Z992 Dependence on renal dialysis: Secondary | ICD-10-CM | POA: Diagnosis not present

## 2023-10-22 DIAGNOSIS — D631 Anemia in chronic kidney disease: Secondary | ICD-10-CM | POA: Diagnosis not present

## 2023-10-24 DIAGNOSIS — Z992 Dependence on renal dialysis: Secondary | ICD-10-CM | POA: Diagnosis not present

## 2023-10-24 DIAGNOSIS — E876 Hypokalemia: Secondary | ICD-10-CM | POA: Diagnosis not present

## 2023-10-24 DIAGNOSIS — D631 Anemia in chronic kidney disease: Secondary | ICD-10-CM | POA: Diagnosis not present

## 2023-10-24 DIAGNOSIS — N2581 Secondary hyperparathyroidism of renal origin: Secondary | ICD-10-CM | POA: Diagnosis not present

## 2023-10-24 DIAGNOSIS — N186 End stage renal disease: Secondary | ICD-10-CM | POA: Diagnosis not present

## 2023-10-27 DIAGNOSIS — N186 End stage renal disease: Secondary | ICD-10-CM | POA: Diagnosis not present

## 2023-10-27 DIAGNOSIS — Z992 Dependence on renal dialysis: Secondary | ICD-10-CM | POA: Diagnosis not present

## 2023-10-27 DIAGNOSIS — D631 Anemia in chronic kidney disease: Secondary | ICD-10-CM | POA: Diagnosis not present

## 2023-10-27 DIAGNOSIS — E876 Hypokalemia: Secondary | ICD-10-CM | POA: Diagnosis not present

## 2023-10-27 DIAGNOSIS — N2581 Secondary hyperparathyroidism of renal origin: Secondary | ICD-10-CM | POA: Diagnosis not present

## 2023-10-29 DIAGNOSIS — E875 Hyperkalemia: Secondary | ICD-10-CM | POA: Diagnosis not present

## 2023-10-29 DIAGNOSIS — Z992 Dependence on renal dialysis: Secondary | ICD-10-CM | POA: Diagnosis not present

## 2023-10-29 DIAGNOSIS — D631 Anemia in chronic kidney disease: Secondary | ICD-10-CM | POA: Diagnosis not present

## 2023-10-29 DIAGNOSIS — E8770 Fluid overload, unspecified: Secondary | ICD-10-CM | POA: Diagnosis not present

## 2023-10-29 DIAGNOSIS — E876 Hypokalemia: Secondary | ICD-10-CM | POA: Diagnosis not present

## 2023-10-29 DIAGNOSIS — N186 End stage renal disease: Secondary | ICD-10-CM | POA: Diagnosis not present

## 2023-10-29 DIAGNOSIS — N2581 Secondary hyperparathyroidism of renal origin: Secondary | ICD-10-CM | POA: Diagnosis not present

## 2023-10-30 ENCOUNTER — Ambulatory Visit: Attending: Cardiovascular Disease | Admitting: Cardiovascular Disease

## 2023-10-30 ENCOUNTER — Encounter: Payer: Self-pay | Admitting: Cardiovascular Disease

## 2023-10-30 VITALS — BP 109/66 | HR 81 | Ht 67.0 in | Wt 220.0 lb

## 2023-10-30 DIAGNOSIS — I4819 Other persistent atrial fibrillation: Secondary | ICD-10-CM

## 2023-10-30 DIAGNOSIS — I4892 Unspecified atrial flutter: Secondary | ICD-10-CM | POA: Diagnosis not present

## 2023-10-30 NOTE — Patient Instructions (Signed)
 Medication Instructions:  Your physician recommends that you continue on your current medications as directed. Please refer to the Current Medication list given to you today.  *If you need a refill on your cardiac medications before your next appointment, please call your pharmacy*  Lab Work: None ordered   Testing/Procedures: None ordered  Follow-Up: At [facility], you and your health needs are our priority.  As part of our continuing mission to provide you with exceptional heart care, our providers are all part of one team.  This team includes your primary Cardiologist (physician) and Advanced Practice Providers or APPs (Physician Assistants and Nurse Practitioners) who all work together to provide you with the care you need, when you need it.  Your next appointment:   6 month(s)  Provider:   You will see one of the following Advanced Practice Providers on your designated Care Team:   Charlies Arthur, PA-C Michael Andy Tillery, PA-C Brandi Ollis, NP Artist Pouch, PA-C     Thank you for choosing [facility]!!   [phone]

## 2023-10-30 NOTE — Progress Notes (Signed)
 Electrophysiology Office Note:    Date:  10/30/2023   ID:  [name], DOB 30-Dec-1960, MRN [id number]  PCP:  Tanda Bleacher, MD   [facility] Providers Cardiologist:  Lurena MARLA Red, MD Electrophysiologist:  Eulas FORBES Furbish, MD     Referring MD: Tanda Bleacher, MD   History of Present Illness:    [name] is a 63 y.o. male with a medical history significant for atrial flutter, CHF with preserved EF, CAD status post CABG (very complicated postoperative course), referred for management of atrial flutter.        History of Present Illness [name] is a 63 year old male with atrial flutter who presents with tachycardia and fluid retention.  Atrial flutter was initially identified in April 2024 with symptoms of tachycardia, chest pain, and shortness of breath. An EKG showed atrial flutter with a heart rate of 143 bpm. He underwent TEE-guided cardioversion on August 17, 2023, but experienced recurrence and was started on amiodarone  10 mg twice daily. He is confused about his medication regimen, particularly regarding amiodarone  post-procedure.  His cardiac history includes coronary artery disease with bypass surgery in August 2023, complicated by postoperative atrial fibrillation, tamponade, cardiac arrest, and VA ECMO support. He has been treated with amiodarone  for atrial fibrillation and has experienced bradycardia and PEA arrest.  He is experiencing weight gain of 8-9 pounds every couple of weeks, suggesting fluid retention. He is on dialysis, and the team is cautious about fluid removal due to low blood pressure. He has not experienced chest tightness or pain since receiving oxygen  during dialysis sessions.  His medication regimen includes Eliquis  twice daily. He is uncertain about his current use of amiodarone  and metoprolol .  He underwent EP study on October 01, 2023 with mapping of typical atrial flutter.  Flutter ablation was performed resulting in sinus rhythm.   He was admitted for approximate 24 hours afterwards for expedited dialysis due to CHF.  He reports that since, he has felt much better --shortness of breath has improved.  They are having more success following fluid with dialysis         Today, he reports that he is doing well.  Energy levels are improved.  Heart rates are within normal range  EKGs/Labs/Other Studies Reviewed Today:     Echocardiogram:  TEE August 17, 2023 LVEF 60 to 65%.  Mildly dilated left atrium.  Mild mitral valve regurgitation.  Aortic valve sclerosis without stenosis.    EKG:   EKG Interpretation Date/Time:  Friday October 30 2023 11:07:12 EDT Ventricular Rate:  81 PR Interval:  142 QRS Duration:  94 QT Interval:  422 QTC Calculation: 490 R Axis:   87  Text Interpretation: Normal sinus rhythm Biatrial enlargement ST & T wave abnormality, consider inferior ischemia ST & T wave abnormality, consider anterolateral ischemia Prolonged QT When compared with ECG of 13-Oct-2023 20:30, No significant change was found Confirmed by Furbish Eulas [phone]) on 10/30/2023 11:54:25 AM     Physical Exam:    VS:  BP 109/66 (BP Location: Left Arm, Patient Position: Sitting, Cuff Size: Large)   Pulse 81   Ht 5' 7 (1.702 m)   Wt 220 lb (99.8 kg)   SpO2 97%   BMI 34.46 kg/m     Wt Readings from Last 3 Encounters:  10/30/23 220 lb (99.8 kg)  10/13/23 225 lb (102.1 kg)  10/02/23 219 lb 2.2 oz (99.4 kg)     GEN: Well nourished, well developed  in no acute distress CARDIAC: tachy, regular rhythm, no murmurs, rubs, gallops RESPIRATORY:  Normal work of breathing MUSCULOSKELETAL: tense edema    ASSESSMENT & PLAN:     Atrial flutter Highly symptomatic with shortness of breath Failed amiodarone  Status post ablation maintaining sinus rhythm Continue apixaban  5 mg p.o. twice daily for now    CHFpEF Volume level significantly improved Volume control with dialysis   End-stage renal disease Would  preferably have more volume removed, but this has been limited by hypotension and tachycardia  Coronary artery disease History of non-STEMI in 2022, multi vessel coronary disease CABG August 2023--complicated postop course Symptoms of dyspnea today attributable to atrial flutter with RVR   Signed, Eulas FORBES Furbish, MD  10/30/2023 12:02 PM    [facility]

## 2023-10-31 DIAGNOSIS — N2581 Secondary hyperparathyroidism of renal origin: Secondary | ICD-10-CM | POA: Diagnosis not present

## 2023-10-31 DIAGNOSIS — N186 End stage renal disease: Secondary | ICD-10-CM | POA: Diagnosis not present

## 2023-10-31 DIAGNOSIS — Z992 Dependence on renal dialysis: Secondary | ICD-10-CM | POA: Diagnosis not present

## 2023-10-31 DIAGNOSIS — D631 Anemia in chronic kidney disease: Secondary | ICD-10-CM | POA: Diagnosis not present

## 2023-10-31 DIAGNOSIS — E876 Hypokalemia: Secondary | ICD-10-CM | POA: Diagnosis not present

## 2023-11-03 DIAGNOSIS — N2581 Secondary hyperparathyroidism of renal origin: Secondary | ICD-10-CM | POA: Diagnosis not present

## 2023-11-03 DIAGNOSIS — D631 Anemia in chronic kidney disease: Secondary | ICD-10-CM | POA: Diagnosis not present

## 2023-11-03 DIAGNOSIS — Z992 Dependence on renal dialysis: Secondary | ICD-10-CM | POA: Diagnosis not present

## 2023-11-03 DIAGNOSIS — N186 End stage renal disease: Secondary | ICD-10-CM | POA: Diagnosis not present

## 2023-11-03 DIAGNOSIS — E876 Hypokalemia: Secondary | ICD-10-CM | POA: Diagnosis not present

## 2023-11-05 DIAGNOSIS — Z992 Dependence on renal dialysis: Secondary | ICD-10-CM | POA: Diagnosis not present

## 2023-11-05 DIAGNOSIS — N186 End stage renal disease: Secondary | ICD-10-CM | POA: Diagnosis not present

## 2023-11-05 DIAGNOSIS — E876 Hypokalemia: Secondary | ICD-10-CM | POA: Diagnosis not present

## 2023-11-05 DIAGNOSIS — N2581 Secondary hyperparathyroidism of renal origin: Secondary | ICD-10-CM | POA: Diagnosis not present

## 2023-11-05 DIAGNOSIS — D631 Anemia in chronic kidney disease: Secondary | ICD-10-CM | POA: Diagnosis not present

## 2023-11-07 DIAGNOSIS — E876 Hypokalemia: Secondary | ICD-10-CM | POA: Diagnosis not present

## 2023-11-07 DIAGNOSIS — Z992 Dependence on renal dialysis: Secondary | ICD-10-CM | POA: Diagnosis not present

## 2023-11-07 DIAGNOSIS — N2581 Secondary hyperparathyroidism of renal origin: Secondary | ICD-10-CM | POA: Diagnosis not present

## 2023-11-07 DIAGNOSIS — N186 End stage renal disease: Secondary | ICD-10-CM | POA: Diagnosis not present

## 2023-11-07 DIAGNOSIS — D631 Anemia in chronic kidney disease: Secondary | ICD-10-CM | POA: Diagnosis not present

## 2023-11-10 DIAGNOSIS — N2581 Secondary hyperparathyroidism of renal origin: Secondary | ICD-10-CM | POA: Diagnosis not present

## 2023-11-10 DIAGNOSIS — E1129 Type 2 diabetes mellitus with other diabetic kidney complication: Secondary | ICD-10-CM | POA: Diagnosis not present

## 2023-11-10 DIAGNOSIS — N186 End stage renal disease: Secondary | ICD-10-CM | POA: Diagnosis not present

## 2023-11-10 DIAGNOSIS — E876 Hypokalemia: Secondary | ICD-10-CM | POA: Diagnosis not present

## 2023-11-10 DIAGNOSIS — D631 Anemia in chronic kidney disease: Secondary | ICD-10-CM | POA: Diagnosis not present

## 2023-11-10 DIAGNOSIS — Z992 Dependence on renal dialysis: Secondary | ICD-10-CM | POA: Diagnosis not present

## 2023-12-16 ENCOUNTER — Other Ambulatory Visit: Payer: Self-pay | Admitting: Physician Assistant

## 2023-12-16 ENCOUNTER — Other Ambulatory Visit: Payer: Self-pay

## 2023-12-18 ENCOUNTER — Other Ambulatory Visit: Payer: Self-pay

## 2023-12-18 MED ORDER — METOPROLOL SUCCINATE ER 25 MG PO TB24
25.0000 mg | ORAL_TABLET | Freq: Every day | ORAL | 2 refills | Status: DC
  Filled 2023-12-18: qty 90, 90d supply, fill #0

## 2023-12-21 ENCOUNTER — Ambulatory Visit: Admitting: Internal Medicine

## 2023-12-21 ENCOUNTER — Encounter: Payer: Self-pay | Admitting: Emergency Medicine

## 2023-12-21 ENCOUNTER — Ambulatory Visit: Admitting: Emergency Medicine

## 2023-12-21 NOTE — Progress Notes (Unsigned)
 Cardiology Office Note:    Date:  12/23/2023   ID:  [name], DOB 10/22/1960, MRN [id number]  PCP:  Tanda Bleacher, MD  Cardiologist:  Lurena MARLA Red, MD Electrophysiologist:  Eulas FORBES Furbish, MD     Referring MD: Tanda Bleacher, MD   Chief Complaint: follow-up of CAD, CHF, and atrial flutter  History of Present Illness:    JOHNATAN BASKETTE is a 63 y.o. male with a history of CAD s/p multiple PCIs in 2017 in setting of NSTEMI and then CABG x5 (LIMA-LAD, SVG-Diag, sequential SVG-OM1-OM2, SVG-dRCA) in 09/2021, cardiac arrest in setting of cardiac tamponade in 09/2021, chronic HFrEF with normalization of EF, paroxysmal atrial fibrillation/flutter s/p ablation in 09/2023 on Eliquis , stroke in 12/2020 and TIA in 03/2021, hypertension, hyperlipidemia, type 2 diabetes mellitus, ESRD on hemodialysis on Tuesdays/ Thursdays/ Saturdays, GERD, and tobacco abuse who is followed by Dr. Red and Dr. Furbish and presents today for routine follow-up.    Patient has a long history of CAD. He was admitted in 03/2015 with a NSTEMI and LHC showed 3 vessel CAD. CABG was initially discussed but PCI was ultimately recommended. He underwent PCI with DES to LAD and then stage PCI with DES to OM1 and DES to left posterior lateral branch. He was readmitted in 12/2020 for an acute CVA and NSTEMI. LHC showed patent stents but severe 3 vessel CAD. Plan was for outpatient CABG after he recovered from his stroke. This was delayed by a recurrent TIA in 03/2021 and then uncontrolled diabetes. He ultimately underwent CABG x5 with LIMA to LAD, SVG to Diag, sequential SVG to OM1 and OM2, and SVG to distal RCA on 09/12/2021. He developed post-op atrial fibrillation and was started on Amiodarone  and Eliquis .   He was readmitted on  09/28/2021 for a pericardial effusion concerning for cardiac tamponade and underwent a VATS procedure with pericardial window. He then had pigtail catheter placed for treatment of left pleural effusion.  Hospitalization was complicated by cardiogenic shock/ hemorrhagic shock following VATS procedure leading to cardiac arrest (PEA/ bradycardia) in setting of diffuse epicardial bleeding and acute hypoxic respiratory failure requiring intubation and bedside sternotomy followed by [facility] ECMO and multiple trips to the OR for washout. He had another brief PEA arrest about 1 week after the first in setting of hypoxia and hypotension. He required multiple inotropes/ pressors throughout admission and multiple blood transfusions. He ultimately required tracheostomy due to prolonged intubation. 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 Initial TTE during admission showed LVEF of 50%; however,  TEE after initial cardiac arrest showed LVEF of 25% with global hypokinesis, moderately reduced RV function, and mild AI.  HE had multiple TTE/ TEEs throughout admission and EF ultimately returned to normal. He had a prolonged hospitalization and was discharged on 12/03/2021 to CIR.  Patient had recurrent issues with atrial flutter since this prolonged admission. He was restarted on Amiodarone  and then underwent TEE/ DCCV in 08/2023 with restoration of sinus rhythm. TEE showed LVEF of 60-65%, mildly reduced RV function, and mild MR. He then underwent an atrial flutter ablation on 10/01/2023. He was intubated for procedure and had stridor and shortness of breath after extubation. 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  He received dialysis and required transient BIPAP as well as as well as bronchodilators and racemic epi, and steroids with improvement.   He was admitted  again from 10/13/2023 to 10/14/2023 for volume overload in setting of missing dialysis after presenting with  dizziness and nausea/ vomiting. Nephrology was consulted and he underwent dialysis with improvement in symptoms.   He was last seen by Dr. Nancey on 10/30/2023 at which time he was doing well and reported his energy level had improved.   Patient presents today for follow-up. Overall, he is doing well. He continues to have some dyspnea on exertion but states this is much better since his ablation. He has chronic but stable lower extremity edema. There is only trace edema on exam. Otherwise, no cardiac complaints. His BP is soft in clinic at 90/50 but is he is asymptomatic with this. He states it usually runs on the lower side. He has not had any problems with dialysis because of this.  ROS: No chest pain, orthopnea, PND,  palpitations, lightheadedness, dizziness, syncope.   EKGs/Labs/Other Studies Reviewed:    The following studies were reviewed:  Left Cardiac Catheterization 12/26/2020:   Prox Cx lesion is 60% stenosed.   Prox Cx to Mid Cx lesion is 90% stenosed.   1st Mrg-2 lesion is 80% stenosed.   Mid LAD lesion is 50% stenosed.   Mid LAD to Dist LAD lesion is 80% stenosed.   2nd Diag lesion is 80% stenosed.   RPAV lesion is 50% stenosed.   RV Branch-1 lesion is 70% stenosed.   RV Branch-2 lesion is 80% stenosed.   Prox RCA-1 lesion is 90% stenosed.   Prox RCA to Mid RCA lesion is 95% stenosed.   Mid RCA lesion is 20% stenosed.   Prox RCA-2 lesion is 65% stenosed.   Dist RCA lesion is 20% stenosed.   1st Diag lesion is 90% stenosed.   Prox LAD lesion is 30% stenosed.   Previously placed Mid Cx to Dist Cx stent (unknown type) is  widely patent.   Previously placed 1st Mrg-1 stent (unknown type) is  widely patent.   LV end diastolic pressure is severely elevated.   Severe multivessel CAD in this patient with previous stents in the LAD, high marginal vessel, distal circumflex  vessels with progressive multiple RCA stenoses.  In this diabetic male with significant diffuse disease in the LAD, left circumflex, and right coronary artery recommend surgical evaluation for consideration of CABG revascularization.  Diagnostic Dominance: Right  _______________  TEE/ DCCV 08/17/2023: Impressions: 1. Left ventricular ejection fraction, by estimation, is 60 to 65%. The  left ventricle has normal function. There is mild left ventricular  hypertrophy.   2. Right ventricular systolic function is mildly reduced. The right  ventricular size is normal.   3. Left atrial size was mildly dilated. No left atrial/left atrial  appendage thrombus was detected.   4. The mitral valve is abnormal. Mild mitral valve regurgitation.   5. The aortic valve is tricuspid. Aortic valve regurgitation is not  visualized. Aortic valve sclerosis is present, with no evidence of aortic  valve stenosis.   Conclusion(s)/Recommendation(s): No LA/LAA thrombus identified. Successful  cardioversion performed with restoration of normal sinus rhythm.  _______________  Atrial Flutter Ablation 10/01/2023: Comprehensive EP study Coronary sinus pacing and recording 3D Mapping of supraventricular tachycardia Radiofrequency ablation of supraventricular tachycardia -- CTI for atrial flutter    EKG:  EKG not ordered today.   Recent Labs: 09/04/2023: Magnesium  2.1; TSH 0.510 10/01/2023:  B Natriuretic Peptide 715.1 10/13/2023: ALT 13; BUN 88; Creatinine, Ser 10.21; Hemoglobin 10.6; Platelets 213; Potassium 5.4; Sodium 137  Recent Lipid Panel    Component Value Date/Time   CHOL 122 04/29/2023 1420   TRIG 82 04/29/2023 1420   HDL 42 04/29/2023 1420   CHOLHDL 2.9 04/29/2023 1420   CHOLHDL 8.1 03/12/2021 0500   VLDL UNABLE TO CALCULATE IF TRIGLYCERIDE OVER 400 mg/dL 98/68/7976 9499   LDLCALC 64 04/29/2023 1420   LDLDIRECT 114.3 (H) 03/12/2021 0500    Physical Exam:    Vital Signs: BP (!) 90/50   Pulse 68    Ht 5' 7 (1.702 m)   Wt 212 lb 12.8 oz (96.5 kg)   SpO2 91%   BMI 33.33 kg/m     Wt Readings from Last 3 Encounters:  12/23/23 212 lb 12.8 oz (96.5 kg)  10/30/23 220 lb (99.8 kg)  10/13/23 225 lb (102.1 kg)     General: 63 y.o. African-American male in no acute distress. HEENT: Normocephalic and atraumatic. Sclera clear.  Neck: Supple. No carotid bruits. No JVD. Heart: RRR. II/ VI systolic murmur.  Lungs: No increased work of breathing. Clear to ausculation bilaterally. No wheezes, rhonchi, or rales.  Extremities: Trace lower extremity edema bilaterally. Skin: Warm and dry. Neuro: No focal deficits. Psych: Normal affect. Responds appropriately.  Assessment:    1. Coronary artery disease involving native coronary artery of native heart without angina pectoris   2. S/P CABG (coronary artery bypass graft)   3. Chronic HFrEF with Recovered EF   4. Persistent atrial fibrillation/ flutter s/p ablation   5. Primary hypertension   6. Hyperlipidemia, unspecified hyperlipidemia type   7. Type 2 diabetes mellitus with complication, without long-term current use of insulin  (HCC)   8. ESRD (end stage renal disease) (HCC)     Plan:    CAD s/p CABG History of extensive CAD with multiple PCI and then CABG x5 in 09/2021 with a very complicated and prolonged post-op course including cardiac tamponade and cardiac arrest as detailed above.  - No chest pain.  He describes some dyspnea on exertion but I suspect this is largely due to deconditioning.  It has greatly improved since his atrial flutter ablation. - No Aspirin  due to need for DOAC.  - Continue Lipitor  80mg  daily. - As above, suspect dyspnea is largely due to deconditioning.  Recommended slowly increasing his physical activity as tolerated.  Would consider repeat ischemic evaluation with a stress test if he has no significant improvement with increasing his activity level or if he has any worsening symptoms.  Chronic HFrEF with  Recovered EF EF as low as 25% following cardiac arrest in 09/2021 but EF quickly normalized. Last TEE in 08/2023 showed LVEF of 60-65%, mildly reduced RV function, and mild MR. - Euvolemic on exam.  - Volume status managed via dialysis.  - Continue Toprol -XL 25mg  daily.  - No ACEi/ ARB/ ARNI, MRA, or SGLT2 inhibitor given ESRD.   Persistent Atrial Fibrillation/ Flutter S/p atrial flutter ablation in 09/2023.  - Maintaining sinus rhythm on exam.  - Continue Toprol -XL 25mg  daily.  - Continue Eliquis  5mg  twice daily.   Hypertension BP soft at 90/50.  Asymptomatic with this. - Continue Toprol -XL 25mg  daily.   Hyperlipidemia Lipid panel in 07/2023: Total Cholesterol 97, Triglycerides 91, HDL 40, LDL 40. LDL goal <55.  - Continue Lipitor  80mg  daily.   Type 2 Diabetes Mellitus Hemoglobin A1c 6.2% in 04/2023.  - Not currently on any medications.  -  Management per PCP.  ESRD On hemodialysis on Tuesdays/ Thursdays/ Saturdays.  - Management per Nephrology.   Disposition: Follow up in 4 months.   Signed, Aline FORBES Door, PA-C  12/23/2023 12:43 PM    [facility]

## 2023-12-23 ENCOUNTER — Ambulatory Visit: Attending: Student | Admitting: Student

## 2023-12-23 ENCOUNTER — Encounter: Payer: Self-pay | Admitting: Student

## 2023-12-23 ENCOUNTER — Other Ambulatory Visit (HOSPITAL_COMMUNITY): Payer: Self-pay

## 2023-12-23 ENCOUNTER — Ambulatory Visit: Admitting: Emergency Medicine

## 2023-12-23 VITALS — BP 90/50 | HR 68 | Ht 67.0 in | Wt 212.8 lb

## 2023-12-23 DIAGNOSIS — Z951 Presence of aortocoronary bypass graft: Secondary | ICD-10-CM | POA: Diagnosis not present

## 2023-12-23 DIAGNOSIS — I4819 Other persistent atrial fibrillation: Secondary | ICD-10-CM

## 2023-12-23 DIAGNOSIS — E118 Type 2 diabetes mellitus with unspecified complications: Secondary | ICD-10-CM

## 2023-12-23 DIAGNOSIS — I5022 Chronic systolic (congestive) heart failure: Secondary | ICD-10-CM | POA: Diagnosis not present

## 2023-12-23 DIAGNOSIS — I2510 Atherosclerotic heart disease of native coronary artery without angina pectoris: Secondary | ICD-10-CM

## 2023-12-23 DIAGNOSIS — I10 Essential (primary) hypertension: Secondary | ICD-10-CM

## 2023-12-23 DIAGNOSIS — N186 End stage renal disease: Secondary | ICD-10-CM

## 2023-12-23 DIAGNOSIS — E785 Hyperlipidemia, unspecified: Secondary | ICD-10-CM

## 2023-12-23 MED ORDER — METOPROLOL SUCCINATE ER 25 MG PO TB24
25.0000 mg | ORAL_TABLET | Freq: Every day | ORAL | 2 refills | Status: AC
  Filled 2023-12-23: qty 90, 90d supply, fill #0

## 2023-12-23 NOTE — Patient Instructions (Signed)
 Thank you for choosing [facility]!     Medication Instructions:  No medication changes were made during today's visit.  *If you need a refill on your cardiac medications before your next appointment, please call your pharmacy*   Lab Work: No labs were ordered during today's visit.  If you have labs (blood work) drawn today and your tests are completely normal, you will receive your results only by: MyChart Message (if you have MyChart) OR A paper copy in the mail If you have any lab test that is abnormal or we need to change your treatment, we will call you to review the results.   Testing/Procedures: No procedures were ordered during today's visit.   Your next appointment:   4 month(s)   Provider:   Aline Door, PA-C           Follow-Up: At [facility], you and your health needs are our priority.  As part of our continuing mission to provide you with exceptional heart care, we have created designated Provider Care Teams.  These Care Teams include your primary Cardiologist (physician) and Advanced Practice Providers (APPs -  Physician Assistants and Nurse Practitioners) who all work together to provide you with the care you need, when you need it. We recommend signing up for the patient portal called MyChart.  Sign up information is provided on this After Visit Summary.  MyChart is used to connect with patients for Virtual Visits (Telemedicine).  Patients are able to view lab/test results, encounter notes, upcoming appointments, etc.  Non-urgent messages can be sent to your provider as well.   To learn more about what you can do with MyChart, go to [email or web address].

## 2024-02-09 ENCOUNTER — Other Ambulatory Visit: Payer: Self-pay

## 2024-02-09 ENCOUNTER — Other Ambulatory Visit: Payer: Self-pay | Admitting: Physician Assistant

## 2024-02-10 ENCOUNTER — Other Ambulatory Visit: Payer: Self-pay

## 2024-02-10 MED ORDER — ATORVASTATIN CALCIUM 80 MG PO TABS
80.0000 mg | ORAL_TABLET | Freq: Every day | ORAL | 3 refills | Status: AC
  Filled 2024-02-10: qty 90, 90d supply, fill #0

## 2024-02-12 ENCOUNTER — Other Ambulatory Visit: Payer: Self-pay

## 2024-02-23 ENCOUNTER — Telehealth: Payer: Self-pay | Admitting: Pharmacy Technician

## 2024-02-23 ENCOUNTER — Other Ambulatory Visit (HOSPITAL_COMMUNITY): Payer: Self-pay

## 2024-02-23 NOTE — Telephone Encounter (Signed)
" °  WAITING FOR MEDICARE VERIFICATION to be approved  Patient Advocate Encounter   The patient was approved for a [employer] grant that will help cover the cost of Eliquis  Total amount awarded, [id number].  Effective: 01/24/24 - 01/22/25   APW:[id number] ERW:[id number] Hmnle:[id number] PI:[id number]  [employer] ID: [id number]   Pharmacy provided with approval and processing information. Patient informed via mychart  "

## 2024-02-24 ENCOUNTER — Other Ambulatory Visit: Payer: Self-pay

## 2024-02-24 ENCOUNTER — Other Ambulatory Visit: Payer: Self-pay | Admitting: Physician Assistant

## 2024-02-24 MED ORDER — APIXABAN 5 MG PO TABS
5.0000 mg | ORAL_TABLET | Freq: Two times a day (BID) | ORAL | 3 refills | Status: DC
  Filled 2024-02-24 – 2024-03-02 (×2): qty 180, 90d supply, fill #0

## 2024-02-24 NOTE — Telephone Encounter (Signed)
 Prescription refill request for Eliquis  received. Indication: a fib Last office visit: 12/23/23 Scr:  10.21 epic 10/13/23 Age: 64 Weight: 96.5 kg

## 2024-03-02 ENCOUNTER — Other Ambulatory Visit: Payer: Self-pay | Admitting: Pharmacist

## 2024-03-02 ENCOUNTER — Other Ambulatory Visit: Payer: Self-pay

## 2024-03-02 DIAGNOSIS — I4819 Other persistent atrial fibrillation: Secondary | ICD-10-CM

## 2024-03-02 MED ORDER — APIXABAN 5 MG PO TABS
5.0000 mg | ORAL_TABLET | Freq: Two times a day (BID) | ORAL | 3 refills | Status: AC
  Filled 2024-03-02: qty 180, 90d supply, fill #0

## 2024-04-25 ENCOUNTER — Ambulatory Visit: Admitting: Student
